# Patient Record
Sex: Female | Born: 1945
Health system: Southern US, Community
[De-identification: ages and names within clinical notes are randomized; demographics above are authoritative.]

## PROBLEM LIST (undated history)

## (undated) DIAGNOSIS — J449 Chronic obstructive pulmonary disease, unspecified: Secondary | ICD-10-CM

## (undated) DIAGNOSIS — I10 Essential (primary) hypertension: Secondary | ICD-10-CM

## (undated) DIAGNOSIS — E785 Hyperlipidemia, unspecified: Secondary | ICD-10-CM

## (undated) DIAGNOSIS — E039 Hypothyroidism, unspecified: Secondary | ICD-10-CM

## (undated) DIAGNOSIS — D126 Benign neoplasm of colon, unspecified: Secondary | ICD-10-CM

## (undated) DIAGNOSIS — E669 Obesity, unspecified: Secondary | ICD-10-CM

## (undated) DIAGNOSIS — Z86718 Personal history of other venous thrombosis and embolism: Secondary | ICD-10-CM

## (undated) DIAGNOSIS — I739 Peripheral vascular disease, unspecified: Secondary | ICD-10-CM

## (undated) DIAGNOSIS — R002 Palpitations: Secondary | ICD-10-CM

## (undated) DIAGNOSIS — R112 Nausea with vomiting, unspecified: Secondary | ICD-10-CM

## (undated) DIAGNOSIS — J45909 Unspecified asthma, uncomplicated: Secondary | ICD-10-CM

## (undated) DIAGNOSIS — Z923 Personal history of irradiation: Secondary | ICD-10-CM

## (undated) DIAGNOSIS — N183 Chronic kidney disease, stage 3 unspecified: Secondary | ICD-10-CM

## (undated) DIAGNOSIS — M5090 Cervical disc disorder, unspecified, unspecified cervical region: Secondary | ICD-10-CM

## (undated) DIAGNOSIS — I2699 Other pulmonary embolism without acute cor pulmonale: Secondary | ICD-10-CM

## (undated) DIAGNOSIS — I2510 Atherosclerotic heart disease of native coronary artery without angina pectoris: Secondary | ICD-10-CM

## (undated) DIAGNOSIS — I5032 Chronic diastolic (congestive) heart failure: Secondary | ICD-10-CM

## (undated) DIAGNOSIS — C3490 Malignant neoplasm of unspecified part of unspecified bronchus or lung: Secondary | ICD-10-CM

## (undated) DIAGNOSIS — Z87891 Personal history of nicotine dependence: Secondary | ICD-10-CM

## (undated) DIAGNOSIS — Z9889 Other specified postprocedural states: Secondary | ICD-10-CM

## (undated) DIAGNOSIS — E119 Type 2 diabetes mellitus without complications: Secondary | ICD-10-CM

## (undated) DIAGNOSIS — K219 Gastro-esophageal reflux disease without esophagitis: Secondary | ICD-10-CM

## (undated) DIAGNOSIS — M4326 Fusion of spine, lumbar region: Secondary | ICD-10-CM

## (undated) DIAGNOSIS — N289 Disorder of kidney and ureter, unspecified: Secondary | ICD-10-CM

## (undated) DIAGNOSIS — I639 Cerebral infarction, unspecified: Secondary | ICD-10-CM

## (undated) HISTORY — DX: Chronic kidney disease, stage 3 unspecified: N18.30

## (undated) HISTORY — DX: Personal history of other venous thrombosis and embolism: Z86.718

## (undated) HISTORY — DX: Obesity, unspecified: E66.9

## (undated) HISTORY — DX: Atherosclerotic heart disease of native coronary artery without angina pectoris: I25.10

## (undated) HISTORY — PX: CHOLECYSTECTOMY: SHX55

## (undated) HISTORY — DX: Peripheral vascular disease, unspecified: I73.9

## (undated) HISTORY — DX: Hyperlipidemia, unspecified: E78.5

## (undated) HISTORY — DX: Chronic diastolic (congestive) heart failure: I50.32

## (undated) HISTORY — DX: Malignant neoplasm of unspecified part of unspecified bronchus or lung: C34.90

## (undated) HISTORY — DX: Personal history of nicotine dependence: Z87.891

## (undated) HISTORY — DX: Gastro-esophageal reflux disease without esophagitis: K21.9

## (undated) HISTORY — DX: Essential (primary) hypertension: I10

## (undated) HISTORY — PX: TRIGGER FINGER RELEASE: SHX641

## (undated) HISTORY — DX: Hypothyroidism, unspecified: E03.9

## (undated) HISTORY — PX: REPLACEMENT TOTAL KNEE: SUR1224

## (undated) HISTORY — DX: Benign neoplasm of colon, unspecified: D12.6

## (undated) HISTORY — PX: BACK SURGERY: SHX140

## (undated) HISTORY — DX: Palpitations: R00.2

## (undated) HISTORY — PX: THYROIDECTOMY: SHX17

## (undated) HISTORY — DX: Cerebral infarction, unspecified: I63.9

## (undated) HISTORY — DX: Chronic obstructive pulmonary disease, unspecified: J44.9

## (undated) HISTORY — PX: BREAST SURGERY: SHX581

## (undated) HISTORY — PX: ANGIOPLASTY / STENTING FEMORAL: SUR30

## (undated) HISTORY — DX: Chronic kidney disease, stage 3 (moderate): N18.3

## (undated) HISTORY — DX: Other pulmonary embolism without acute cor pulmonale: I26.99

## (undated) HISTORY — DX: Fusion of spine, lumbar region: M43.26

## (undated) HISTORY — PX: DIAGNOSTIC LAPAROSCOPY: SUR761

## (undated) HISTORY — PX: MASTECTOMY: SHX3

---
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
## 1999-03-14 DIAGNOSIS — I129 Hypertensive chronic kidney disease with stage 1 through stage 4 chronic kidney disease, or unspecified chronic kidney disease: Secondary | ICD-10-CM | POA: Insufficient documentation

## 1999-03-14 DIAGNOSIS — E669 Obesity, unspecified: Secondary | ICD-10-CM | POA: Insufficient documentation

## 1999-03-14 DIAGNOSIS — Z7901 Long term (current) use of anticoagulants: Secondary | ICD-10-CM | POA: Insufficient documentation

## 1999-03-14 DIAGNOSIS — Z794 Long term (current) use of insulin: Secondary | ICD-10-CM | POA: Insufficient documentation

## 1999-03-14 DIAGNOSIS — I700 Atherosclerosis of aorta: Secondary | ICD-10-CM | POA: Insufficient documentation

## 1999-03-14 DIAGNOSIS — G9341 Metabolic encephalopathy: Secondary | ICD-10-CM | POA: Insufficient documentation

## 1999-03-14 DIAGNOSIS — J439 Emphysema, unspecified: Secondary | ICD-10-CM | POA: Insufficient documentation

## 1999-03-14 DIAGNOSIS — Z7951 Long term (current) use of inhaled steroids: Secondary | ICD-10-CM | POA: Insufficient documentation

## 1999-03-14 DIAGNOSIS — E1122 Type 2 diabetes mellitus with diabetic chronic kidney disease: Secondary | ICD-10-CM | POA: Insufficient documentation

## 1999-03-14 DIAGNOSIS — G8929 Other chronic pain: Secondary | ICD-10-CM | POA: Insufficient documentation

## 1999-03-14 DIAGNOSIS — K760 Fatty (change of) liver, not elsewhere classified: Secondary | ICD-10-CM | POA: Insufficient documentation

## 1999-03-14 DIAGNOSIS — E1151 Type 2 diabetes mellitus with diabetic peripheral angiopathy without gangrene: Secondary | ICD-10-CM | POA: Insufficient documentation

## 1999-03-14 DIAGNOSIS — N1832 Chronic kidney disease, stage 3b: Secondary | ICD-10-CM | POA: Insufficient documentation

## 1999-03-14 DIAGNOSIS — K5790 Diverticulosis of intestine, part unspecified, without perforation or abscess without bleeding: Secondary | ICD-10-CM | POA: Insufficient documentation

## 1999-03-14 DIAGNOSIS — I2510 Atherosclerotic heart disease of native coronary artery without angina pectoris: Secondary | ICD-10-CM | POA: Insufficient documentation

## 1999-03-14 DIAGNOSIS — Z86711 Personal history of pulmonary embolism: Secondary | ICD-10-CM | POA: Insufficient documentation

## 1999-03-14 DIAGNOSIS — E875 Hyperkalemia: Secondary | ICD-10-CM | POA: Insufficient documentation

## 1999-03-14 DIAGNOSIS — E8809 Other disorders of plasma-protein metabolism, not elsewhere classified: Secondary | ICD-10-CM | POA: Insufficient documentation

## 1999-03-14 DIAGNOSIS — G894 Chronic pain syndrome: Secondary | ICD-10-CM | POA: Insufficient documentation

## 1999-03-14 DIAGNOSIS — C50911 Malignant neoplasm of unspecified site of right female breast: Secondary | ICD-10-CM | POA: Insufficient documentation

## 1999-03-14 HISTORY — DX: Malignant neoplasm of unspecified site of right female breast: C50.911

## 2004-11-09 ENCOUNTER — Inpatient Hospital Stay: Payer: Self-pay | Admitting: Otolaryngology

## 2005-03-27 ENCOUNTER — Ambulatory Visit: Payer: Self-pay | Admitting: Orthopedic Surgery

## 2006-06-28 ENCOUNTER — Ambulatory Visit: Payer: Self-pay | Admitting: Internal Medicine

## 2006-08-17 ENCOUNTER — Ambulatory Visit: Payer: Self-pay | Admitting: Orthopedic Surgery

## 2006-08-21 ENCOUNTER — Ambulatory Visit: Payer: Self-pay | Admitting: Orthopedic Surgery

## 2007-07-30 ENCOUNTER — Ambulatory Visit: Payer: Self-pay | Admitting: Internal Medicine

## 2007-10-01 ENCOUNTER — Ambulatory Visit: Payer: Self-pay | Admitting: Urology

## 2007-10-02 ENCOUNTER — Ambulatory Visit: Payer: Self-pay | Admitting: Internal Medicine

## 2007-12-24 ENCOUNTER — Ambulatory Visit: Payer: Self-pay | Admitting: Internal Medicine

## 2007-12-26 ENCOUNTER — Ambulatory Visit: Payer: Self-pay | Admitting: Internal Medicine

## 2008-01-10 ENCOUNTER — Ambulatory Visit: Payer: Self-pay | Admitting: Unknown Physician Specialty

## 2008-01-16 ENCOUNTER — Ambulatory Visit: Payer: Self-pay | Admitting: Unknown Physician Specialty

## 2008-08-24 ENCOUNTER — Ambulatory Visit: Payer: Self-pay | Admitting: Urology

## 2008-09-02 ENCOUNTER — Ambulatory Visit: Payer: Self-pay | Admitting: Urology

## 2009-03-13 LAB — HM COLONOSCOPY

## 2009-06-04 ENCOUNTER — Ambulatory Visit: Payer: Self-pay | Admitting: Internal Medicine

## 2009-08-17 ENCOUNTER — Ambulatory Visit: Payer: Self-pay | Admitting: Urology

## 2010-01-13 ENCOUNTER — Ambulatory Visit: Payer: Self-pay | Admitting: Internal Medicine

## 2010-07-11 ENCOUNTER — Ambulatory Visit: Payer: Self-pay | Admitting: Vascular Surgery

## 2010-08-22 ENCOUNTER — Ambulatory Visit: Payer: Self-pay | Admitting: Urology

## 2010-12-13 ENCOUNTER — Ambulatory Visit: Payer: Self-pay | Admitting: Family

## 2011-01-04 ENCOUNTER — Ambulatory Visit: Payer: Self-pay | Admitting: Cardiovascular Disease

## 2011-01-04 HISTORY — PX: CARDIAC CATHETERIZATION: SHX172

## 2011-03-14 HISTORY — PX: COLONOSCOPY: SHX174

## 2011-03-22 DIAGNOSIS — R1013 Epigastric pain: Secondary | ICD-10-CM | POA: Diagnosis not present

## 2011-03-22 DIAGNOSIS — K219 Gastro-esophageal reflux disease without esophagitis: Secondary | ICD-10-CM | POA: Diagnosis not present

## 2011-03-22 DIAGNOSIS — R109 Unspecified abdominal pain: Secondary | ICD-10-CM | POA: Diagnosis not present

## 2011-03-22 DIAGNOSIS — I10 Essential (primary) hypertension: Secondary | ICD-10-CM | POA: Diagnosis not present

## 2011-03-22 DIAGNOSIS — R1031 Right lower quadrant pain: Secondary | ICD-10-CM | POA: Diagnosis not present

## 2011-03-27 ENCOUNTER — Ambulatory Visit: Payer: Self-pay | Admitting: Emergency Medicine

## 2011-03-27 DIAGNOSIS — K5730 Diverticulosis of large intestine without perforation or abscess without bleeding: Secondary | ICD-10-CM | POA: Diagnosis not present

## 2011-03-27 DIAGNOSIS — J438 Other emphysema: Secondary | ICD-10-CM | POA: Diagnosis not present

## 2011-03-27 DIAGNOSIS — R109 Unspecified abdominal pain: Secondary | ICD-10-CM | POA: Diagnosis not present

## 2011-03-27 DIAGNOSIS — J439 Emphysema, unspecified: Secondary | ICD-10-CM | POA: Diagnosis not present

## 2011-03-28 DIAGNOSIS — K5732 Diverticulitis of large intestine without perforation or abscess without bleeding: Secondary | ICD-10-CM | POA: Diagnosis not present

## 2011-04-03 DIAGNOSIS — R109 Unspecified abdominal pain: Secondary | ICD-10-CM | POA: Diagnosis not present

## 2011-04-03 DIAGNOSIS — R1013 Epigastric pain: Secondary | ICD-10-CM | POA: Diagnosis not present

## 2011-04-03 DIAGNOSIS — Z1211 Encounter for screening for malignant neoplasm of colon: Secondary | ICD-10-CM | POA: Diagnosis not present

## 2011-04-03 DIAGNOSIS — D126 Benign neoplasm of colon, unspecified: Secondary | ICD-10-CM | POA: Diagnosis not present

## 2011-04-19 DIAGNOSIS — D126 Benign neoplasm of colon, unspecified: Secondary | ICD-10-CM | POA: Diagnosis not present

## 2011-04-19 DIAGNOSIS — R1013 Epigastric pain: Secondary | ICD-10-CM | POA: Diagnosis not present

## 2011-04-19 DIAGNOSIS — K319 Disease of stomach and duodenum, unspecified: Secondary | ICD-10-CM | POA: Diagnosis not present

## 2011-04-19 DIAGNOSIS — Z1211 Encounter for screening for malignant neoplasm of colon: Secondary | ICD-10-CM | POA: Diagnosis not present

## 2011-04-19 DIAGNOSIS — R1031 Right lower quadrant pain: Secondary | ICD-10-CM | POA: Diagnosis not present

## 2011-04-19 DIAGNOSIS — K2940 Chronic atrophic gastritis without bleeding: Secondary | ICD-10-CM | POA: Diagnosis not present

## 2011-05-11 DIAGNOSIS — I10 Essential (primary) hypertension: Secondary | ICD-10-CM | POA: Diagnosis not present

## 2011-05-11 DIAGNOSIS — E785 Hyperlipidemia, unspecified: Secondary | ICD-10-CM | POA: Diagnosis not present

## 2011-05-11 DIAGNOSIS — I739 Peripheral vascular disease, unspecified: Secondary | ICD-10-CM | POA: Diagnosis not present

## 2011-05-11 DIAGNOSIS — I2510 Atherosclerotic heart disease of native coronary artery without angina pectoris: Secondary | ICD-10-CM | POA: Diagnosis not present

## 2011-05-16 DIAGNOSIS — I10 Essential (primary) hypertension: Secondary | ICD-10-CM | POA: Diagnosis not present

## 2011-05-16 DIAGNOSIS — I70219 Atherosclerosis of native arteries of extremities with intermittent claudication, unspecified extremity: Secondary | ICD-10-CM | POA: Diagnosis not present

## 2011-05-16 DIAGNOSIS — E785 Hyperlipidemia, unspecified: Secondary | ICD-10-CM | POA: Diagnosis not present

## 2011-06-22 DIAGNOSIS — H2510 Age-related nuclear cataract, unspecified eye: Secondary | ICD-10-CM | POA: Diagnosis not present

## 2011-06-27 DIAGNOSIS — J398 Other specified diseases of upper respiratory tract: Secondary | ICD-10-CM | POA: Diagnosis not present

## 2011-06-27 DIAGNOSIS — J3089 Other allergic rhinitis: Secondary | ICD-10-CM | POA: Diagnosis not present

## 2011-07-12 DIAGNOSIS — M6530 Trigger finger, unspecified finger: Secondary | ICD-10-CM | POA: Diagnosis not present

## 2011-07-17 ENCOUNTER — Ambulatory Visit: Payer: Self-pay | Admitting: Vascular Surgery

## 2011-07-17 DIAGNOSIS — Z9889 Other specified postprocedural states: Secondary | ICD-10-CM | POA: Diagnosis not present

## 2011-07-17 DIAGNOSIS — I70219 Atherosclerosis of native arteries of extremities with intermittent claudication, unspecified extremity: Secondary | ICD-10-CM | POA: Diagnosis not present

## 2011-07-17 DIAGNOSIS — I10 Essential (primary) hypertension: Secondary | ICD-10-CM | POA: Diagnosis not present

## 2011-07-17 DIAGNOSIS — I739 Peripheral vascular disease, unspecified: Secondary | ICD-10-CM | POA: Diagnosis not present

## 2011-08-09 DIAGNOSIS — M6530 Trigger finger, unspecified finger: Secondary | ICD-10-CM | POA: Diagnosis not present

## 2011-08-17 DIAGNOSIS — J398 Other specified diseases of upper respiratory tract: Secondary | ICD-10-CM | POA: Diagnosis not present

## 2011-08-22 DIAGNOSIS — N1330 Unspecified hydronephrosis: Secondary | ICD-10-CM | POA: Diagnosis not present

## 2011-08-25 ENCOUNTER — Ambulatory Visit: Payer: Self-pay | Admitting: Urology

## 2011-08-25 DIAGNOSIS — N1330 Unspecified hydronephrosis: Secondary | ICD-10-CM | POA: Diagnosis not present

## 2011-09-05 DIAGNOSIS — N1330 Unspecified hydronephrosis: Secondary | ICD-10-CM | POA: Diagnosis not present

## 2011-09-05 DIAGNOSIS — I10 Essential (primary) hypertension: Secondary | ICD-10-CM | POA: Diagnosis not present

## 2011-09-05 DIAGNOSIS — I70219 Atherosclerosis of native arteries of extremities with intermittent claudication, unspecified extremity: Secondary | ICD-10-CM | POA: Diagnosis not present

## 2011-09-05 DIAGNOSIS — E785 Hyperlipidemia, unspecified: Secondary | ICD-10-CM | POA: Diagnosis not present

## 2011-09-20 ENCOUNTER — Ambulatory Visit: Payer: Self-pay | Admitting: Urology

## 2011-09-20 DIAGNOSIS — N1330 Unspecified hydronephrosis: Secondary | ICD-10-CM | POA: Diagnosis not present

## 2011-09-20 DIAGNOSIS — I10 Essential (primary) hypertension: Secondary | ICD-10-CM | POA: Diagnosis not present

## 2011-09-20 DIAGNOSIS — Z01812 Encounter for preprocedural laboratory examination: Secondary | ICD-10-CM | POA: Diagnosis not present

## 2011-09-20 LAB — BASIC METABOLIC PANEL
Anion Gap: 9 (ref 7–16)
BUN: 17 mg/dL (ref 7–18)
Calcium, Total: 8.4 mg/dL — ABNORMAL LOW (ref 8.5–10.1)
Chloride: 103 mmol/L (ref 98–107)
Co2: 28 mmol/L (ref 21–32)
Creatinine: 1.31 mg/dL — ABNORMAL HIGH (ref 0.60–1.30)
EGFR (African American): 48 — ABNORMAL LOW
EGFR (Non-African Amer.): 42 — ABNORMAL LOW
Glucose: 139 mg/dL — ABNORMAL HIGH (ref 65–99)
Osmolality: 283 (ref 275–301)
Potassium: 4.4 mmol/L (ref 3.5–5.1)
Sodium: 140 mmol/L (ref 136–145)

## 2011-09-20 LAB — HEMOGLOBIN: HGB: 12.4 g/dL (ref 12.0–16.0)

## 2011-09-22 LAB — URINE CULTURE

## 2011-09-25 DIAGNOSIS — M1990 Unspecified osteoarthritis, unspecified site: Secondary | ICD-10-CM | POA: Diagnosis not present

## 2011-09-25 DIAGNOSIS — M545 Low back pain, unspecified: Secondary | ICD-10-CM | POA: Diagnosis not present

## 2011-09-26 ENCOUNTER — Ambulatory Visit: Payer: Self-pay | Admitting: Internal Medicine

## 2011-09-26 DIAGNOSIS — D649 Anemia, unspecified: Secondary | ICD-10-CM | POA: Diagnosis not present

## 2011-09-26 DIAGNOSIS — M25559 Pain in unspecified hip: Secondary | ICD-10-CM | POA: Diagnosis not present

## 2011-09-26 DIAGNOSIS — I70219 Atherosclerosis of native arteries of extremities with intermittent claudication, unspecified extremity: Secondary | ICD-10-CM | POA: Diagnosis not present

## 2011-09-26 DIAGNOSIS — M79609 Pain in unspecified limb: Secondary | ICD-10-CM | POA: Diagnosis not present

## 2011-09-26 DIAGNOSIS — E7800 Pure hypercholesterolemia, unspecified: Secondary | ICD-10-CM | POA: Diagnosis not present

## 2011-09-26 DIAGNOSIS — M5137 Other intervertebral disc degeneration, lumbosacral region: Secondary | ICD-10-CM | POA: Diagnosis not present

## 2011-09-26 DIAGNOSIS — M51379 Other intervertebral disc degeneration, lumbosacral region without mention of lumbar back pain or lower extremity pain: Secondary | ICD-10-CM | POA: Diagnosis not present

## 2011-09-28 ENCOUNTER — Emergency Department: Payer: Self-pay | Admitting: Emergency Medicine

## 2011-09-28 DIAGNOSIS — M1990 Unspecified osteoarthritis, unspecified site: Secondary | ICD-10-CM | POA: Diagnosis not present

## 2011-09-28 DIAGNOSIS — M545 Low back pain, unspecified: Secondary | ICD-10-CM | POA: Diagnosis not present

## 2011-09-28 DIAGNOSIS — Z79899 Other long term (current) drug therapy: Secondary | ICD-10-CM | POA: Diagnosis not present

## 2011-09-28 DIAGNOSIS — R52 Pain, unspecified: Secondary | ICD-10-CM | POA: Diagnosis not present

## 2011-10-04 ENCOUNTER — Ambulatory Visit: Payer: Self-pay | Admitting: Internal Medicine

## 2011-10-04 DIAGNOSIS — M5137 Other intervertebral disc degeneration, lumbosacral region: Secondary | ICD-10-CM | POA: Diagnosis not present

## 2011-10-04 DIAGNOSIS — M5126 Other intervertebral disc displacement, lumbar region: Secondary | ICD-10-CM | POA: Diagnosis not present

## 2011-10-04 DIAGNOSIS — M48061 Spinal stenosis, lumbar region without neurogenic claudication: Secondary | ICD-10-CM | POA: Diagnosis not present

## 2011-10-13 DIAGNOSIS — IMO0002 Reserved for concepts with insufficient information to code with codable children: Secondary | ICD-10-CM | POA: Diagnosis not present

## 2011-11-01 ENCOUNTER — Ambulatory Visit: Payer: Self-pay | Admitting: Urology

## 2011-11-01 DIAGNOSIS — N135 Crossing vessel and stricture of ureter without hydronephrosis: Secondary | ICD-10-CM | POA: Diagnosis not present

## 2011-11-01 DIAGNOSIS — Z01812 Encounter for preprocedural laboratory examination: Secondary | ICD-10-CM | POA: Diagnosis not present

## 2011-11-01 DIAGNOSIS — N1330 Unspecified hydronephrosis: Secondary | ICD-10-CM | POA: Diagnosis not present

## 2011-11-03 LAB — URINE CULTURE

## 2011-11-08 ENCOUNTER — Ambulatory Visit: Payer: Self-pay | Admitting: Urology

## 2011-11-08 DIAGNOSIS — J309 Allergic rhinitis, unspecified: Secondary | ICD-10-CM | POA: Diagnosis not present

## 2011-11-08 DIAGNOSIS — Z808 Family history of malignant neoplasm of other organs or systems: Secondary | ICD-10-CM | POA: Diagnosis not present

## 2011-11-08 DIAGNOSIS — F17200 Nicotine dependence, unspecified, uncomplicated: Secondary | ICD-10-CM | POA: Diagnosis not present

## 2011-11-08 DIAGNOSIS — K3189 Other diseases of stomach and duodenum: Secondary | ICD-10-CM | POA: Diagnosis not present

## 2011-11-08 DIAGNOSIS — Z800 Family history of malignant neoplasm of digestive organs: Secondary | ICD-10-CM | POA: Diagnosis not present

## 2011-11-08 DIAGNOSIS — I739 Peripheral vascular disease, unspecified: Secondary | ICD-10-CM | POA: Diagnosis not present

## 2011-11-08 DIAGNOSIS — Z8489 Family history of other specified conditions: Secondary | ICD-10-CM | POA: Diagnosis not present

## 2011-11-08 DIAGNOSIS — I2510 Atherosclerotic heart disease of native coronary artery without angina pectoris: Secondary | ICD-10-CM | POA: Diagnosis not present

## 2011-11-08 DIAGNOSIS — Z8249 Family history of ischemic heart disease and other diseases of the circulatory system: Secondary | ICD-10-CM | POA: Diagnosis not present

## 2011-11-08 DIAGNOSIS — R42 Dizziness and giddiness: Secondary | ICD-10-CM | POA: Diagnosis not present

## 2011-11-08 DIAGNOSIS — Q6239 Other obstructive defects of renal pelvis and ureter: Secondary | ICD-10-CM | POA: Diagnosis not present

## 2011-11-08 DIAGNOSIS — Z833 Family history of diabetes mellitus: Secondary | ICD-10-CM | POA: Diagnosis not present

## 2011-11-08 DIAGNOSIS — N135 Crossing vessel and stricture of ureter without hydronephrosis: Secondary | ICD-10-CM | POA: Diagnosis not present

## 2011-11-08 DIAGNOSIS — Z7982 Long term (current) use of aspirin: Secondary | ICD-10-CM | POA: Diagnosis not present

## 2011-11-08 DIAGNOSIS — M4800 Spinal stenosis, site unspecified: Secondary | ICD-10-CM | POA: Diagnosis not present

## 2011-11-08 DIAGNOSIS — N1330 Unspecified hydronephrosis: Secondary | ICD-10-CM | POA: Diagnosis not present

## 2011-11-08 DIAGNOSIS — I10 Essential (primary) hypertension: Secondary | ICD-10-CM | POA: Diagnosis not present

## 2011-11-08 DIAGNOSIS — E079 Disorder of thyroid, unspecified: Secondary | ICD-10-CM | POA: Diagnosis not present

## 2011-11-08 DIAGNOSIS — I209 Angina pectoris, unspecified: Secondary | ICD-10-CM | POA: Diagnosis not present

## 2011-11-08 DIAGNOSIS — Z79899 Other long term (current) drug therapy: Secondary | ICD-10-CM | POA: Diagnosis not present

## 2011-11-16 DIAGNOSIS — I2510 Atherosclerotic heart disease of native coronary artery without angina pectoris: Secondary | ICD-10-CM | POA: Diagnosis not present

## 2011-11-16 DIAGNOSIS — I739 Peripheral vascular disease, unspecified: Secondary | ICD-10-CM | POA: Diagnosis not present

## 2011-11-16 DIAGNOSIS — E785 Hyperlipidemia, unspecified: Secondary | ICD-10-CM | POA: Diagnosis not present

## 2011-11-16 DIAGNOSIS — R079 Chest pain, unspecified: Secondary | ICD-10-CM | POA: Diagnosis not present

## 2011-11-17 DIAGNOSIS — N1330 Unspecified hydronephrosis: Secondary | ICD-10-CM | POA: Insufficient documentation

## 2011-11-17 DIAGNOSIS — R011 Cardiac murmur, unspecified: Secondary | ICD-10-CM | POA: Diagnosis not present

## 2011-11-21 DIAGNOSIS — Q6239 Other obstructive defects of renal pelvis and ureter: Secondary | ICD-10-CM | POA: Insufficient documentation

## 2011-11-21 DIAGNOSIS — N1330 Unspecified hydronephrosis: Secondary | ICD-10-CM | POA: Diagnosis not present

## 2011-11-22 DIAGNOSIS — R072 Precordial pain: Secondary | ICD-10-CM | POA: Diagnosis not present

## 2011-11-24 DIAGNOSIS — I10 Essential (primary) hypertension: Secondary | ICD-10-CM | POA: Diagnosis not present

## 2011-11-24 DIAGNOSIS — E785 Hyperlipidemia, unspecified: Secondary | ICD-10-CM | POA: Diagnosis not present

## 2011-11-24 DIAGNOSIS — I2510 Atherosclerotic heart disease of native coronary artery without angina pectoris: Secondary | ICD-10-CM | POA: Diagnosis not present

## 2011-11-24 DIAGNOSIS — I739 Peripheral vascular disease, unspecified: Secondary | ICD-10-CM | POA: Diagnosis not present

## 2011-11-28 DIAGNOSIS — I10 Essential (primary) hypertension: Secondary | ICD-10-CM | POA: Diagnosis not present

## 2011-11-28 DIAGNOSIS — Z79899 Other long term (current) drug therapy: Secondary | ICD-10-CM | POA: Diagnosis not present

## 2011-11-28 DIAGNOSIS — R079 Chest pain, unspecified: Secondary | ICD-10-CM | POA: Diagnosis not present

## 2011-11-28 DIAGNOSIS — R0602 Shortness of breath: Secondary | ICD-10-CM | POA: Diagnosis not present

## 2011-12-07 ENCOUNTER — Ambulatory Visit: Payer: Self-pay | Admitting: Cardiovascular Disease

## 2011-12-07 DIAGNOSIS — Z8249 Family history of ischemic heart disease and other diseases of the circulatory system: Secondary | ICD-10-CM | POA: Diagnosis not present

## 2011-12-07 DIAGNOSIS — I2510 Atherosclerotic heart disease of native coronary artery without angina pectoris: Secondary | ICD-10-CM | POA: Diagnosis not present

## 2011-12-07 DIAGNOSIS — I739 Peripheral vascular disease, unspecified: Secondary | ICD-10-CM | POA: Diagnosis not present

## 2011-12-07 DIAGNOSIS — I200 Unstable angina: Secondary | ICD-10-CM | POA: Diagnosis not present

## 2011-12-07 DIAGNOSIS — E785 Hyperlipidemia, unspecified: Secondary | ICD-10-CM | POA: Diagnosis not present

## 2011-12-07 DIAGNOSIS — I10 Essential (primary) hypertension: Secondary | ICD-10-CM | POA: Diagnosis not present

## 2011-12-07 DIAGNOSIS — R5381 Other malaise: Secondary | ICD-10-CM | POA: Diagnosis not present

## 2011-12-07 HISTORY — PX: CARDIAC CATHETERIZATION: SHX172

## 2011-12-14 DIAGNOSIS — R0602 Shortness of breath: Secondary | ICD-10-CM | POA: Diagnosis not present

## 2011-12-14 DIAGNOSIS — I739 Peripheral vascular disease, unspecified: Secondary | ICD-10-CM | POA: Diagnosis not present

## 2011-12-14 DIAGNOSIS — E785 Hyperlipidemia, unspecified: Secondary | ICD-10-CM | POA: Diagnosis not present

## 2011-12-14 DIAGNOSIS — I2510 Atherosclerotic heart disease of native coronary artery without angina pectoris: Secondary | ICD-10-CM | POA: Diagnosis not present

## 2011-12-26 DIAGNOSIS — J45901 Unspecified asthma with (acute) exacerbation: Secondary | ICD-10-CM | POA: Diagnosis not present

## 2011-12-26 DIAGNOSIS — J45909 Unspecified asthma, uncomplicated: Secondary | ICD-10-CM | POA: Diagnosis not present

## 2012-01-02 DIAGNOSIS — I10 Essential (primary) hypertension: Secondary | ICD-10-CM | POA: Diagnosis not present

## 2012-01-02 DIAGNOSIS — I739 Peripheral vascular disease, unspecified: Secondary | ICD-10-CM | POA: Diagnosis not present

## 2012-01-02 DIAGNOSIS — I70219 Atherosclerosis of native arteries of extremities with intermittent claudication, unspecified extremity: Secondary | ICD-10-CM | POA: Diagnosis not present

## 2012-01-05 DIAGNOSIS — Q6239 Other obstructive defects of renal pelvis and ureter: Secondary | ICD-10-CM | POA: Diagnosis not present

## 2012-01-05 DIAGNOSIS — R82998 Other abnormal findings in urine: Secondary | ICD-10-CM | POA: Diagnosis not present

## 2012-01-15 DIAGNOSIS — J449 Chronic obstructive pulmonary disease, unspecified: Secondary | ICD-10-CM | POA: Diagnosis not present

## 2012-01-15 DIAGNOSIS — I739 Peripheral vascular disease, unspecified: Secondary | ICD-10-CM | POA: Diagnosis not present

## 2012-01-15 DIAGNOSIS — I10 Essential (primary) hypertension: Secondary | ICD-10-CM | POA: Diagnosis not present

## 2012-01-15 DIAGNOSIS — I2510 Atherosclerotic heart disease of native coronary artery without angina pectoris: Secondary | ICD-10-CM | POA: Diagnosis not present

## 2012-01-18 DIAGNOSIS — Z23 Encounter for immunization: Secondary | ICD-10-CM | POA: Diagnosis not present

## 2012-02-12 DIAGNOSIS — N39498 Other specified urinary incontinence: Secondary | ICD-10-CM | POA: Diagnosis not present

## 2012-02-12 DIAGNOSIS — E039 Hypothyroidism, unspecified: Secondary | ICD-10-CM | POA: Diagnosis not present

## 2012-02-12 DIAGNOSIS — E063 Autoimmune thyroiditis: Secondary | ICD-10-CM | POA: Diagnosis not present

## 2012-02-19 DIAGNOSIS — I10 Essential (primary) hypertension: Secondary | ICD-10-CM | POA: Diagnosis not present

## 2012-02-19 DIAGNOSIS — E785 Hyperlipidemia, unspecified: Secondary | ICD-10-CM | POA: Diagnosis not present

## 2012-02-19 DIAGNOSIS — I70219 Atherosclerosis of native arteries of extremities with intermittent claudication, unspecified extremity: Secondary | ICD-10-CM | POA: Diagnosis not present

## 2012-02-20 DIAGNOSIS — M1990 Unspecified osteoarthritis, unspecified site: Secondary | ICD-10-CM | POA: Diagnosis not present

## 2012-02-20 DIAGNOSIS — E785 Hyperlipidemia, unspecified: Secondary | ICD-10-CM | POA: Diagnosis not present

## 2012-02-20 DIAGNOSIS — E8881 Metabolic syndrome: Secondary | ICD-10-CM | POA: Diagnosis not present

## 2012-03-13 HISTORY — PX: BACK SURGERY: SHX140

## 2012-03-19 DIAGNOSIS — L738 Other specified follicular disorders: Secondary | ICD-10-CM | POA: Diagnosis not present

## 2012-03-19 DIAGNOSIS — L219 Seborrheic dermatitis, unspecified: Secondary | ICD-10-CM | POA: Diagnosis not present

## 2012-03-19 DIAGNOSIS — L989 Disorder of the skin and subcutaneous tissue, unspecified: Secondary | ICD-10-CM | POA: Diagnosis not present

## 2012-03-19 DIAGNOSIS — M5030 Other cervical disc degeneration, unspecified cervical region: Secondary | ICD-10-CM | POA: Diagnosis not present

## 2012-03-22 DIAGNOSIS — I10 Essential (primary) hypertension: Secondary | ICD-10-CM | POA: Diagnosis not present

## 2012-03-22 DIAGNOSIS — N289 Disorder of kidney and ureter, unspecified: Secondary | ICD-10-CM | POA: Diagnosis not present

## 2012-03-22 DIAGNOSIS — Z5181 Encounter for therapeutic drug level monitoring: Secondary | ICD-10-CM | POA: Diagnosis not present

## 2012-03-22 DIAGNOSIS — R9431 Abnormal electrocardiogram [ECG] [EKG]: Secondary | ICD-10-CM | POA: Diagnosis not present

## 2012-03-22 DIAGNOSIS — Z01818 Encounter for other preprocedural examination: Secondary | ICD-10-CM | POA: Diagnosis not present

## 2012-03-22 DIAGNOSIS — M47812 Spondylosis without myelopathy or radiculopathy, cervical region: Secondary | ICD-10-CM | POA: Diagnosis not present

## 2012-03-22 DIAGNOSIS — Z79899 Other long term (current) drug therapy: Secondary | ICD-10-CM | POA: Diagnosis not present

## 2012-03-25 DIAGNOSIS — I2510 Atherosclerotic heart disease of native coronary artery without angina pectoris: Secondary | ICD-10-CM | POA: Diagnosis not present

## 2012-03-25 DIAGNOSIS — I739 Peripheral vascular disease, unspecified: Secondary | ICD-10-CM | POA: Diagnosis not present

## 2012-03-25 DIAGNOSIS — E785 Hyperlipidemia, unspecified: Secondary | ICD-10-CM | POA: Diagnosis not present

## 2012-03-25 DIAGNOSIS — I10 Essential (primary) hypertension: Secondary | ICD-10-CM | POA: Diagnosis not present

## 2012-03-26 DIAGNOSIS — Z23 Encounter for immunization: Secondary | ICD-10-CM | POA: Diagnosis not present

## 2012-03-27 DIAGNOSIS — M549 Dorsalgia, unspecified: Secondary | ICD-10-CM | POA: Diagnosis not present

## 2012-03-27 DIAGNOSIS — I2510 Atherosclerotic heart disease of native coronary artery without angina pectoris: Secondary | ICD-10-CM | POA: Diagnosis present

## 2012-03-27 DIAGNOSIS — I10 Essential (primary) hypertension: Secondary | ICD-10-CM | POA: Diagnosis not present

## 2012-03-27 DIAGNOSIS — E119 Type 2 diabetes mellitus without complications: Secondary | ICD-10-CM | POA: Diagnosis present

## 2012-03-27 DIAGNOSIS — M5137 Other intervertebral disc degeneration, lumbosacral region: Secondary | ICD-10-CM | POA: Diagnosis not present

## 2012-03-27 DIAGNOSIS — R5381 Other malaise: Secondary | ICD-10-CM | POA: Diagnosis not present

## 2012-03-27 DIAGNOSIS — M48061 Spinal stenosis, lumbar region without neurogenic claudication: Secondary | ICD-10-CM | POA: Diagnosis not present

## 2012-03-27 DIAGNOSIS — N289 Disorder of kidney and ureter, unspecified: Secondary | ICD-10-CM | POA: Diagnosis not present

## 2012-03-27 DIAGNOSIS — E039 Hypothyroidism, unspecified: Secondary | ICD-10-CM | POA: Diagnosis present

## 2012-03-27 DIAGNOSIS — E785 Hyperlipidemia, unspecified: Secondary | ICD-10-CM | POA: Diagnosis present

## 2012-03-27 DIAGNOSIS — G250 Essential tremor: Secondary | ICD-10-CM | POA: Diagnosis not present

## 2012-03-27 DIAGNOSIS — IMO0002 Reserved for concepts with insufficient information to code with codable children: Secondary | ICD-10-CM | POA: Diagnosis not present

## 2012-03-27 DIAGNOSIS — Z981 Arthrodesis status: Secondary | ICD-10-CM | POA: Diagnosis not present

## 2012-03-27 DIAGNOSIS — R7989 Other specified abnormal findings of blood chemistry: Secondary | ICD-10-CM | POA: Diagnosis not present

## 2012-03-27 DIAGNOSIS — IMO0001 Reserved for inherently not codable concepts without codable children: Secondary | ICD-10-CM | POA: Diagnosis present

## 2012-03-27 DIAGNOSIS — M48062 Spinal stenosis, lumbar region with neurogenic claudication: Secondary | ICD-10-CM | POA: Diagnosis not present

## 2012-03-27 DIAGNOSIS — K219 Gastro-esophageal reflux disease without esophagitis: Secondary | ICD-10-CM | POA: Diagnosis present

## 2012-03-27 DIAGNOSIS — M51379 Other intervertebral disc degeneration, lumbosacral region without mention of lumbar back pain or lower extremity pain: Secondary | ICD-10-CM | POA: Diagnosis present

## 2012-03-27 DIAGNOSIS — R279 Unspecified lack of coordination: Secondary | ICD-10-CM | POA: Diagnosis not present

## 2012-03-27 DIAGNOSIS — Z5189 Encounter for other specified aftercare: Secondary | ICD-10-CM | POA: Diagnosis not present

## 2012-03-27 DIAGNOSIS — N189 Chronic kidney disease, unspecified: Secondary | ICD-10-CM | POA: Diagnosis not present

## 2012-03-30 DIAGNOSIS — I10 Essential (primary) hypertension: Secondary | ICD-10-CM | POA: Diagnosis not present

## 2012-03-30 DIAGNOSIS — Z5189 Encounter for other specified aftercare: Secondary | ICD-10-CM | POA: Diagnosis not present

## 2012-03-30 DIAGNOSIS — R7989 Other specified abnormal findings of blood chemistry: Secondary | ICD-10-CM | POA: Diagnosis not present

## 2012-03-30 DIAGNOSIS — N189 Chronic kidney disease, unspecified: Secondary | ICD-10-CM | POA: Diagnosis not present

## 2012-03-30 DIAGNOSIS — R5381 Other malaise: Secondary | ICD-10-CM | POA: Diagnosis not present

## 2012-03-30 DIAGNOSIS — E119 Type 2 diabetes mellitus without complications: Secondary | ICD-10-CM | POA: Diagnosis not present

## 2012-04-09 DIAGNOSIS — I259 Chronic ischemic heart disease, unspecified: Secondary | ICD-10-CM | POA: Diagnosis not present

## 2012-04-09 DIAGNOSIS — M48061 Spinal stenosis, lumbar region without neurogenic claudication: Secondary | ICD-10-CM | POA: Diagnosis not present

## 2012-04-09 DIAGNOSIS — M549 Dorsalgia, unspecified: Secondary | ICD-10-CM | POA: Diagnosis not present

## 2012-04-09 DIAGNOSIS — Z4789 Encounter for other orthopedic aftercare: Secondary | ICD-10-CM | POA: Diagnosis not present

## 2012-04-09 DIAGNOSIS — N189 Chronic kidney disease, unspecified: Secondary | ICD-10-CM | POA: Diagnosis not present

## 2012-04-09 DIAGNOSIS — R269 Unspecified abnormalities of gait and mobility: Secondary | ICD-10-CM | POA: Diagnosis not present

## 2012-04-09 DIAGNOSIS — Z9181 History of falling: Secondary | ICD-10-CM | POA: Diagnosis not present

## 2012-04-09 DIAGNOSIS — I129 Hypertensive chronic kidney disease with stage 1 through stage 4 chronic kidney disease, or unspecified chronic kidney disease: Secondary | ICD-10-CM | POA: Diagnosis not present

## 2012-04-11 DIAGNOSIS — M48061 Spinal stenosis, lumbar region without neurogenic claudication: Secondary | ICD-10-CM | POA: Diagnosis not present

## 2012-04-11 DIAGNOSIS — N189 Chronic kidney disease, unspecified: Secondary | ICD-10-CM | POA: Diagnosis not present

## 2012-04-11 DIAGNOSIS — M5030 Other cervical disc degeneration, unspecified cervical region: Secondary | ICD-10-CM | POA: Diagnosis not present

## 2012-04-11 DIAGNOSIS — I129 Hypertensive chronic kidney disease with stage 1 through stage 4 chronic kidney disease, or unspecified chronic kidney disease: Secondary | ICD-10-CM | POA: Diagnosis not present

## 2012-04-11 DIAGNOSIS — R269 Unspecified abnormalities of gait and mobility: Secondary | ICD-10-CM | POA: Diagnosis not present

## 2012-04-11 DIAGNOSIS — Z4789 Encounter for other orthopedic aftercare: Secondary | ICD-10-CM | POA: Diagnosis not present

## 2012-04-11 DIAGNOSIS — M549 Dorsalgia, unspecified: Secondary | ICD-10-CM | POA: Diagnosis not present

## 2012-04-16 DIAGNOSIS — M48061 Spinal stenosis, lumbar region without neurogenic claudication: Secondary | ICD-10-CM | POA: Diagnosis not present

## 2012-04-16 DIAGNOSIS — Z4789 Encounter for other orthopedic aftercare: Secondary | ICD-10-CM | POA: Diagnosis not present

## 2012-04-16 DIAGNOSIS — N189 Chronic kidney disease, unspecified: Secondary | ICD-10-CM | POA: Diagnosis not present

## 2012-04-16 DIAGNOSIS — R269 Unspecified abnormalities of gait and mobility: Secondary | ICD-10-CM | POA: Diagnosis not present

## 2012-04-16 DIAGNOSIS — I129 Hypertensive chronic kidney disease with stage 1 through stage 4 chronic kidney disease, or unspecified chronic kidney disease: Secondary | ICD-10-CM | POA: Diagnosis not present

## 2012-04-16 DIAGNOSIS — M549 Dorsalgia, unspecified: Secondary | ICD-10-CM | POA: Diagnosis not present

## 2012-04-19 DIAGNOSIS — M48061 Spinal stenosis, lumbar region without neurogenic claudication: Secondary | ICD-10-CM | POA: Diagnosis not present

## 2012-04-19 DIAGNOSIS — Z4789 Encounter for other orthopedic aftercare: Secondary | ICD-10-CM | POA: Diagnosis not present

## 2012-04-19 DIAGNOSIS — I129 Hypertensive chronic kidney disease with stage 1 through stage 4 chronic kidney disease, or unspecified chronic kidney disease: Secondary | ICD-10-CM | POA: Diagnosis not present

## 2012-04-19 DIAGNOSIS — R269 Unspecified abnormalities of gait and mobility: Secondary | ICD-10-CM | POA: Diagnosis not present

## 2012-04-19 DIAGNOSIS — N189 Chronic kidney disease, unspecified: Secondary | ICD-10-CM | POA: Diagnosis not present

## 2012-04-19 DIAGNOSIS — M549 Dorsalgia, unspecified: Secondary | ICD-10-CM | POA: Diagnosis not present

## 2012-05-01 DIAGNOSIS — N3020 Other chronic cystitis without hematuria: Secondary | ICD-10-CM | POA: Diagnosis not present

## 2012-05-01 DIAGNOSIS — R82998 Other abnormal findings in urine: Secondary | ICD-10-CM | POA: Diagnosis not present

## 2012-05-01 DIAGNOSIS — N3946 Mixed incontinence: Secondary | ICD-10-CM | POA: Diagnosis not present

## 2012-05-01 DIAGNOSIS — Q6239 Other obstructive defects of renal pelvis and ureter: Secondary | ICD-10-CM | POA: Diagnosis not present

## 2012-05-02 DIAGNOSIS — N189 Chronic kidney disease, unspecified: Secondary | ICD-10-CM | POA: Diagnosis not present

## 2012-05-02 DIAGNOSIS — M549 Dorsalgia, unspecified: Secondary | ICD-10-CM | POA: Diagnosis not present

## 2012-05-02 DIAGNOSIS — Z4789 Encounter for other orthopedic aftercare: Secondary | ICD-10-CM | POA: Diagnosis not present

## 2012-05-02 DIAGNOSIS — R82998 Other abnormal findings in urine: Secondary | ICD-10-CM | POA: Diagnosis not present

## 2012-05-02 DIAGNOSIS — M48061 Spinal stenosis, lumbar region without neurogenic claudication: Secondary | ICD-10-CM | POA: Diagnosis not present

## 2012-05-02 DIAGNOSIS — R269 Unspecified abnormalities of gait and mobility: Secondary | ICD-10-CM | POA: Diagnosis not present

## 2012-05-02 DIAGNOSIS — I129 Hypertensive chronic kidney disease with stage 1 through stage 4 chronic kidney disease, or unspecified chronic kidney disease: Secondary | ICD-10-CM | POA: Diagnosis not present

## 2012-05-09 DIAGNOSIS — M48062 Spinal stenosis, lumbar region with neurogenic claudication: Secondary | ICD-10-CM | POA: Diagnosis not present

## 2012-05-14 DIAGNOSIS — I739 Peripheral vascular disease, unspecified: Secondary | ICD-10-CM | POA: Diagnosis not present

## 2012-05-14 DIAGNOSIS — I10 Essential (primary) hypertension: Secondary | ICD-10-CM | POA: Diagnosis not present

## 2012-05-14 DIAGNOSIS — I2510 Atherosclerotic heart disease of native coronary artery without angina pectoris: Secondary | ICD-10-CM | POA: Diagnosis not present

## 2012-05-14 DIAGNOSIS — E785 Hyperlipidemia, unspecified: Secondary | ICD-10-CM | POA: Diagnosis not present

## 2012-05-21 DIAGNOSIS — I739 Peripheral vascular disease, unspecified: Secondary | ICD-10-CM | POA: Diagnosis not present

## 2012-05-21 DIAGNOSIS — N1330 Unspecified hydronephrosis: Secondary | ICD-10-CM | POA: Diagnosis not present

## 2012-05-21 DIAGNOSIS — E8881 Metabolic syndrome: Secondary | ICD-10-CM | POA: Diagnosis not present

## 2012-05-21 DIAGNOSIS — I10 Essential (primary) hypertension: Secondary | ICD-10-CM | POA: Diagnosis not present

## 2012-05-22 DIAGNOSIS — I10 Essential (primary) hypertension: Secondary | ICD-10-CM | POA: Diagnosis not present

## 2012-05-22 DIAGNOSIS — I70219 Atherosclerosis of native arteries of extremities with intermittent claudication, unspecified extremity: Secondary | ICD-10-CM | POA: Diagnosis not present

## 2012-05-22 DIAGNOSIS — E669 Obesity, unspecified: Secondary | ICD-10-CM | POA: Diagnosis not present

## 2012-05-22 DIAGNOSIS — E785 Hyperlipidemia, unspecified: Secondary | ICD-10-CM | POA: Diagnosis not present

## 2012-06-20 ENCOUNTER — Ambulatory Visit: Payer: Self-pay | Admitting: Internal Medicine

## 2012-06-20 DIAGNOSIS — Z1231 Encounter for screening mammogram for malignant neoplasm of breast: Secondary | ICD-10-CM | POA: Diagnosis not present

## 2012-07-09 DIAGNOSIS — M5430 Sciatica, unspecified side: Secondary | ICD-10-CM | POA: Diagnosis not present

## 2012-07-09 DIAGNOSIS — M545 Low back pain, unspecified: Secondary | ICD-10-CM | POA: Diagnosis not present

## 2012-07-10 DIAGNOSIS — H43319 Vitreous membranes and strands, unspecified eye: Secondary | ICD-10-CM | POA: Diagnosis not present

## 2012-07-15 DIAGNOSIS — I739 Peripheral vascular disease, unspecified: Secondary | ICD-10-CM | POA: Diagnosis not present

## 2012-07-15 DIAGNOSIS — J449 Chronic obstructive pulmonary disease, unspecified: Secondary | ICD-10-CM | POA: Diagnosis not present

## 2012-07-15 DIAGNOSIS — R0602 Shortness of breath: Secondary | ICD-10-CM | POA: Diagnosis not present

## 2012-07-15 DIAGNOSIS — I2510 Atherosclerotic heart disease of native coronary artery without angina pectoris: Secondary | ICD-10-CM | POA: Diagnosis not present

## 2012-07-23 DIAGNOSIS — M47817 Spondylosis without myelopathy or radiculopathy, lumbosacral region: Secondary | ICD-10-CM | POA: Diagnosis not present

## 2012-07-29 DIAGNOSIS — R609 Edema, unspecified: Secondary | ICD-10-CM | POA: Diagnosis not present

## 2012-07-29 DIAGNOSIS — I2510 Atherosclerotic heart disease of native coronary artery without angina pectoris: Secondary | ICD-10-CM | POA: Diagnosis not present

## 2012-07-29 DIAGNOSIS — I739 Peripheral vascular disease, unspecified: Secondary | ICD-10-CM | POA: Diagnosis not present

## 2012-07-29 DIAGNOSIS — E785 Hyperlipidemia, unspecified: Secondary | ICD-10-CM | POA: Diagnosis not present

## 2012-07-29 DIAGNOSIS — I509 Heart failure, unspecified: Secondary | ICD-10-CM | POA: Diagnosis not present

## 2012-08-08 DIAGNOSIS — M5430 Sciatica, unspecified side: Secondary | ICD-10-CM | POA: Diagnosis not present

## 2012-08-08 DIAGNOSIS — M48062 Spinal stenosis, lumbar region with neurogenic claudication: Secondary | ICD-10-CM | POA: Diagnosis not present

## 2012-08-12 DIAGNOSIS — I10 Essential (primary) hypertension: Secondary | ICD-10-CM | POA: Diagnosis not present

## 2012-08-12 DIAGNOSIS — I2510 Atherosclerotic heart disease of native coronary artery without angina pectoris: Secondary | ICD-10-CM | POA: Diagnosis not present

## 2012-08-12 DIAGNOSIS — E785 Hyperlipidemia, unspecified: Secondary | ICD-10-CM | POA: Diagnosis not present

## 2012-08-12 DIAGNOSIS — I509 Heart failure, unspecified: Secondary | ICD-10-CM | POA: Diagnosis not present

## 2012-08-29 DIAGNOSIS — I739 Peripheral vascular disease, unspecified: Secondary | ICD-10-CM | POA: Diagnosis not present

## 2012-08-29 DIAGNOSIS — I10 Essential (primary) hypertension: Secondary | ICD-10-CM | POA: Diagnosis not present

## 2012-08-29 DIAGNOSIS — E8881 Metabolic syndrome: Secondary | ICD-10-CM | POA: Diagnosis not present

## 2012-08-29 DIAGNOSIS — K5730 Diverticulosis of large intestine without perforation or abscess without bleeding: Secondary | ICD-10-CM | POA: Diagnosis not present

## 2012-09-17 DIAGNOSIS — M48062 Spinal stenosis, lumbar region with neurogenic claudication: Secondary | ICD-10-CM | POA: Diagnosis not present

## 2012-09-17 DIAGNOSIS — M5430 Sciatica, unspecified side: Secondary | ICD-10-CM | POA: Diagnosis not present

## 2012-10-07 DIAGNOSIS — N390 Urinary tract infection, site not specified: Secondary | ICD-10-CM | POA: Diagnosis not present

## 2012-10-30 DIAGNOSIS — N3946 Mixed incontinence: Secondary | ICD-10-CM | POA: Diagnosis not present

## 2012-10-30 DIAGNOSIS — Q6239 Other obstructive defects of renal pelvis and ureter: Secondary | ICD-10-CM | POA: Diagnosis not present

## 2012-10-30 DIAGNOSIS — N3020 Other chronic cystitis without hematuria: Secondary | ICD-10-CM | POA: Diagnosis not present

## 2012-11-14 DIAGNOSIS — I2510 Atherosclerotic heart disease of native coronary artery without angina pectoris: Secondary | ICD-10-CM | POA: Diagnosis not present

## 2012-11-14 DIAGNOSIS — I739 Peripheral vascular disease, unspecified: Secondary | ICD-10-CM | POA: Diagnosis not present

## 2012-11-14 DIAGNOSIS — E785 Hyperlipidemia, unspecified: Secondary | ICD-10-CM | POA: Diagnosis not present

## 2012-11-14 DIAGNOSIS — R5381 Other malaise: Secondary | ICD-10-CM | POA: Diagnosis not present

## 2012-11-14 DIAGNOSIS — R0602 Shortness of breath: Secondary | ICD-10-CM | POA: Diagnosis not present

## 2012-11-14 DIAGNOSIS — R079 Chest pain, unspecified: Secondary | ICD-10-CM | POA: Diagnosis not present

## 2012-11-20 DIAGNOSIS — I2510 Atherosclerotic heart disease of native coronary artery without angina pectoris: Secondary | ICD-10-CM | POA: Diagnosis not present

## 2012-11-20 DIAGNOSIS — I10 Essential (primary) hypertension: Secondary | ICD-10-CM | POA: Diagnosis not present

## 2012-11-20 DIAGNOSIS — R072 Precordial pain: Secondary | ICD-10-CM | POA: Diagnosis not present

## 2012-11-20 DIAGNOSIS — I739 Peripheral vascular disease, unspecified: Secondary | ICD-10-CM | POA: Diagnosis not present

## 2012-11-20 DIAGNOSIS — R0789 Other chest pain: Secondary | ICD-10-CM | POA: Diagnosis not present

## 2012-11-21 DIAGNOSIS — R0789 Other chest pain: Secondary | ICD-10-CM | POA: Diagnosis not present

## 2012-11-21 DIAGNOSIS — I2510 Atherosclerotic heart disease of native coronary artery without angina pectoris: Secondary | ICD-10-CM | POA: Diagnosis not present

## 2012-11-21 DIAGNOSIS — R0602 Shortness of breath: Secondary | ICD-10-CM | POA: Diagnosis not present

## 2012-11-21 DIAGNOSIS — I739 Peripheral vascular disease, unspecified: Secondary | ICD-10-CM | POA: Diagnosis not present

## 2012-11-21 DIAGNOSIS — R072 Precordial pain: Secondary | ICD-10-CM | POA: Diagnosis not present

## 2012-11-21 DIAGNOSIS — I10 Essential (primary) hypertension: Secondary | ICD-10-CM | POA: Diagnosis not present

## 2012-11-21 DIAGNOSIS — R002 Palpitations: Secondary | ICD-10-CM | POA: Diagnosis not present

## 2012-12-03 DIAGNOSIS — E785 Hyperlipidemia, unspecified: Secondary | ICD-10-CM | POA: Diagnosis not present

## 2012-12-03 DIAGNOSIS — I70219 Atherosclerosis of native arteries of extremities with intermittent claudication, unspecified extremity: Secondary | ICD-10-CM | POA: Diagnosis not present

## 2012-12-03 DIAGNOSIS — I209 Angina pectoris, unspecified: Secondary | ICD-10-CM | POA: Diagnosis not present

## 2012-12-03 DIAGNOSIS — K219 Gastro-esophageal reflux disease without esophagitis: Secondary | ICD-10-CM | POA: Diagnosis not present

## 2012-12-03 DIAGNOSIS — Z23 Encounter for immunization: Secondary | ICD-10-CM | POA: Diagnosis not present

## 2012-12-10 DIAGNOSIS — I10 Essential (primary) hypertension: Secondary | ICD-10-CM | POA: Diagnosis not present

## 2012-12-10 DIAGNOSIS — I70219 Atherosclerosis of native arteries of extremities with intermittent claudication, unspecified extremity: Secondary | ICD-10-CM | POA: Diagnosis not present

## 2012-12-10 DIAGNOSIS — M549 Dorsalgia, unspecified: Secondary | ICD-10-CM | POA: Diagnosis not present

## 2012-12-10 DIAGNOSIS — I739 Peripheral vascular disease, unspecified: Secondary | ICD-10-CM | POA: Diagnosis not present

## 2012-12-12 DIAGNOSIS — Q6239 Other obstructive defects of renal pelvis and ureter: Secondary | ICD-10-CM | POA: Diagnosis not present

## 2012-12-12 DIAGNOSIS — N390 Urinary tract infection, site not specified: Secondary | ICD-10-CM | POA: Diagnosis not present

## 2013-02-14 DIAGNOSIS — E063 Autoimmune thyroiditis: Secondary | ICD-10-CM | POA: Diagnosis not present

## 2013-02-14 DIAGNOSIS — Z809 Family history of malignant neoplasm, unspecified: Secondary | ICD-10-CM | POA: Diagnosis not present

## 2013-02-14 DIAGNOSIS — E039 Hypothyroidism, unspecified: Secondary | ICD-10-CM | POA: Diagnosis not present

## 2013-02-14 DIAGNOSIS — E890 Postprocedural hypothyroidism: Secondary | ICD-10-CM | POA: Diagnosis not present

## 2013-02-20 DIAGNOSIS — I10 Essential (primary) hypertension: Secondary | ICD-10-CM | POA: Diagnosis not present

## 2013-02-20 DIAGNOSIS — R55 Syncope and collapse: Secondary | ICD-10-CM | POA: Diagnosis not present

## 2013-02-20 DIAGNOSIS — R079 Chest pain, unspecified: Secondary | ICD-10-CM | POA: Diagnosis not present

## 2013-02-20 DIAGNOSIS — I6350 Cerebral infarction due to unspecified occlusion or stenosis of unspecified cerebral artery: Secondary | ICD-10-CM | POA: Diagnosis not present

## 2013-02-20 DIAGNOSIS — I739 Peripheral vascular disease, unspecified: Secondary | ICD-10-CM | POA: Diagnosis not present

## 2013-02-21 DIAGNOSIS — E039 Hypothyroidism, unspecified: Secondary | ICD-10-CM | POA: Diagnosis not present

## 2013-02-21 DIAGNOSIS — E890 Postprocedural hypothyroidism: Secondary | ICD-10-CM | POA: Diagnosis not present

## 2013-02-21 DIAGNOSIS — E063 Autoimmune thyroiditis: Secondary | ICD-10-CM | POA: Diagnosis not present

## 2013-02-21 DIAGNOSIS — K219 Gastro-esophageal reflux disease without esophagitis: Secondary | ICD-10-CM | POA: Diagnosis not present

## 2013-02-24 DIAGNOSIS — I10 Essential (primary) hypertension: Secondary | ICD-10-CM | POA: Diagnosis not present

## 2013-02-24 DIAGNOSIS — I739 Peripheral vascular disease, unspecified: Secondary | ICD-10-CM | POA: Diagnosis not present

## 2013-02-24 DIAGNOSIS — R079 Chest pain, unspecified: Secondary | ICD-10-CM | POA: Diagnosis not present

## 2013-02-24 DIAGNOSIS — R55 Syncope and collapse: Secondary | ICD-10-CM | POA: Diagnosis not present

## 2013-02-26 ENCOUNTER — Ambulatory Visit: Payer: Self-pay | Admitting: Cardiovascular Disease

## 2013-02-26 DIAGNOSIS — I6350 Cerebral infarction due to unspecified occlusion or stenosis of unspecified cerebral artery: Secondary | ICD-10-CM | POA: Diagnosis not present

## 2013-02-26 DIAGNOSIS — Z8673 Personal history of transient ischemic attack (TIA), and cerebral infarction without residual deficits: Secondary | ICD-10-CM | POA: Diagnosis not present

## 2013-03-04 DIAGNOSIS — J310 Chronic rhinitis: Secondary | ICD-10-CM | POA: Diagnosis not present

## 2013-03-04 DIAGNOSIS — N181 Chronic kidney disease, stage 1: Secondary | ICD-10-CM | POA: Diagnosis not present

## 2013-03-04 DIAGNOSIS — G459 Transient cerebral ischemic attack, unspecified: Secondary | ICD-10-CM | POA: Diagnosis not present

## 2013-03-04 DIAGNOSIS — I129 Hypertensive chronic kidney disease with stage 1 through stage 4 chronic kidney disease, or unspecified chronic kidney disease: Secondary | ICD-10-CM | POA: Diagnosis not present

## 2013-03-10 DIAGNOSIS — H43819 Vitreous degeneration, unspecified eye: Secondary | ICD-10-CM | POA: Diagnosis not present

## 2013-03-13 DIAGNOSIS — I639 Cerebral infarction, unspecified: Secondary | ICD-10-CM

## 2013-03-13 HISTORY — DX: Cerebral infarction, unspecified: I63.9

## 2013-03-21 DIAGNOSIS — E785 Hyperlipidemia, unspecified: Secondary | ICD-10-CM | POA: Diagnosis not present

## 2013-03-21 DIAGNOSIS — I2510 Atherosclerotic heart disease of native coronary artery without angina pectoris: Secondary | ICD-10-CM | POA: Diagnosis not present

## 2013-03-21 DIAGNOSIS — I739 Peripheral vascular disease, unspecified: Secondary | ICD-10-CM | POA: Diagnosis not present

## 2013-03-21 DIAGNOSIS — I10 Essential (primary) hypertension: Secondary | ICD-10-CM | POA: Diagnosis not present

## 2013-03-21 DIAGNOSIS — R079 Chest pain, unspecified: Secondary | ICD-10-CM | POA: Diagnosis not present

## 2013-03-25 DIAGNOSIS — M545 Low back pain, unspecified: Secondary | ICD-10-CM | POA: Diagnosis not present

## 2013-03-25 DIAGNOSIS — M76899 Other specified enthesopathies of unspecified lower limb, excluding foot: Secondary | ICD-10-CM | POA: Diagnosis not present

## 2013-04-07 DIAGNOSIS — N1330 Unspecified hydronephrosis: Secondary | ICD-10-CM | POA: Diagnosis not present

## 2013-04-07 DIAGNOSIS — R040 Epistaxis: Secondary | ICD-10-CM | POA: Diagnosis not present

## 2013-04-07 DIAGNOSIS — E785 Hyperlipidemia, unspecified: Secondary | ICD-10-CM | POA: Diagnosis not present

## 2013-04-07 DIAGNOSIS — E8881 Metabolic syndrome: Secondary | ICD-10-CM | POA: Diagnosis not present

## 2013-04-14 DIAGNOSIS — R350 Frequency of micturition: Secondary | ICD-10-CM | POA: Diagnosis not present

## 2013-04-14 DIAGNOSIS — N390 Urinary tract infection, site not specified: Secondary | ICD-10-CM | POA: Diagnosis not present

## 2013-04-14 DIAGNOSIS — R109 Unspecified abdominal pain: Secondary | ICD-10-CM | POA: Diagnosis not present

## 2013-04-21 DIAGNOSIS — J449 Chronic obstructive pulmonary disease, unspecified: Secondary | ICD-10-CM | POA: Diagnosis not present

## 2013-04-21 DIAGNOSIS — I739 Peripheral vascular disease, unspecified: Secondary | ICD-10-CM | POA: Diagnosis not present

## 2013-04-21 DIAGNOSIS — I10 Essential (primary) hypertension: Secondary | ICD-10-CM | POA: Diagnosis not present

## 2013-04-21 DIAGNOSIS — R0602 Shortness of breath: Secondary | ICD-10-CM | POA: Diagnosis not present

## 2013-04-21 DIAGNOSIS — I2510 Atherosclerotic heart disease of native coronary artery without angina pectoris: Secondary | ICD-10-CM | POA: Diagnosis not present

## 2013-04-21 DIAGNOSIS — E785 Hyperlipidemia, unspecified: Secondary | ICD-10-CM | POA: Diagnosis not present

## 2013-05-27 DIAGNOSIS — M5430 Sciatica, unspecified side: Secondary | ICD-10-CM | POA: Diagnosis not present

## 2013-06-04 DIAGNOSIS — IMO0002 Reserved for concepts with insufficient information to code with codable children: Secondary | ICD-10-CM | POA: Diagnosis not present

## 2013-06-10 DIAGNOSIS — I739 Peripheral vascular disease, unspecified: Secondary | ICD-10-CM | POA: Diagnosis not present

## 2013-06-10 DIAGNOSIS — M79609 Pain in unspecified limb: Secondary | ICD-10-CM | POA: Diagnosis not present

## 2013-06-10 DIAGNOSIS — I10 Essential (primary) hypertension: Secondary | ICD-10-CM | POA: Diagnosis not present

## 2013-06-10 DIAGNOSIS — I70219 Atherosclerosis of native arteries of extremities with intermittent claudication, unspecified extremity: Secondary | ICD-10-CM | POA: Diagnosis not present

## 2013-06-12 DIAGNOSIS — M48062 Spinal stenosis, lumbar region with neurogenic claudication: Secondary | ICD-10-CM | POA: Diagnosis not present

## 2013-06-19 ENCOUNTER — Encounter: Payer: Self-pay | Admitting: Cardiovascular Disease

## 2013-06-19 ENCOUNTER — Encounter (INDEPENDENT_AMBULATORY_CARE_PROVIDER_SITE_OTHER): Payer: Self-pay

## 2013-06-19 ENCOUNTER — Ambulatory Visit (INDEPENDENT_AMBULATORY_CARE_PROVIDER_SITE_OTHER): Payer: Medicare Other | Admitting: Cardiovascular Disease

## 2013-06-19 VITALS — BP 108/80 | HR 61 | Ht 66.0 in | Wt 225.5 lb

## 2013-06-19 DIAGNOSIS — R0602 Shortness of breath: Secondary | ICD-10-CM | POA: Diagnosis not present

## 2013-06-19 DIAGNOSIS — M549 Dorsalgia, unspecified: Secondary | ICD-10-CM

## 2013-06-19 DIAGNOSIS — Z9889 Other specified postprocedural states: Secondary | ICD-10-CM

## 2013-06-19 DIAGNOSIS — E785 Hyperlipidemia, unspecified: Secondary | ICD-10-CM

## 2013-06-19 DIAGNOSIS — R0609 Other forms of dyspnea: Secondary | ICD-10-CM | POA: Insufficient documentation

## 2013-06-19 DIAGNOSIS — I2510 Atherosclerotic heart disease of native coronary artery without angina pectoris: Secondary | ICD-10-CM | POA: Insufficient documentation

## 2013-06-19 DIAGNOSIS — I739 Peripheral vascular disease, unspecified: Secondary | ICD-10-CM | POA: Insufficient documentation

## 2013-06-19 DIAGNOSIS — G8929 Other chronic pain: Secondary | ICD-10-CM | POA: Insufficient documentation

## 2013-06-19 NOTE — Assessment & Plan Note (Signed)
Currently with no symptoms of angina. No further workup at this time. Continue current medication regimen. Past catheterization in 2013

## 2013-06-19 NOTE — Assessment & Plan Note (Signed)
We have encouraged continued exercise, careful diet management in an effort to lose weight. 

## 2013-06-19 NOTE — Assessment & Plan Note (Signed)
She is scheduled for back surgery next week in [facility]. Acceptable risk with no further testing needed

## 2013-06-19 NOTE — Patient Instructions (Signed)
You are doing well. Please cut the losartan HCTZ in 1/2 daily (your blood pressure is low)  Please call us if you have new issues that need to be addressed before your next appt.  Your physician wants you to follow-up in: 6 months.  You will receive a reminder letter in the mail two months in advance. If you don't receive a letter, please call our office to schedule the follow-up appointment.

## 2013-06-19 NOTE — Assessment & Plan Note (Signed)
We'll try to obtain a lipid panel from over the past year. If none has been done, we can order this. Goal LDL less than 70

## 2013-06-19 NOTE — Assessment & Plan Note (Signed)
Shortness of breath likely from obesity, deconditioning, chronic pain. Recommended she start exercise program after her back surgery

## 2013-06-19 NOTE — Progress Notes (Signed)
Patient ID: Tammy [name], female    DOB: [birth year], 68 y.o.   MRN: [id number]  HPI Comments: Tammy [name] is a 68 year old woman with history of hypertension, back surgery January 2014 with rods placed at that time, remote smoking history stopped 5 years ago, history of obesity, deconditioned secondary to chronic back pain who presents to establish care in the office. Other past medical that were noted on outside office paperwork but not discussed with her is PAD with angioplasty of the legs bilaterally,, COPD, chronic kidney disease  She reports having prior heart attack catheterizations in 2012 and 2013. Catheterization in 2012 documented occluded mid RCA with left to right collaterals, 40% mid LAD disease, 40% mid circumflex disease, 30% proximal LAD disease  Catheterization in September 2013 showed normal LV function, occluded mid RCA with left to right collaterals, 50% mid and distal LAD disease  Stress test September 2014 showing very small anterior wall defect likely secondary to breast attenuation artifact Echocardiogram May 2014 showing low normal ejection fraction, mild TR, mild pulmonary hypertension  Most recent lipid panel unavailable. She reports that she has stopped Plavix on her own as well as aspirin in preparation for the surgery She is scheduled for minimally invasive back surgery next Wednesday in [place] [facility] by [name] She is hoping that this will help her back pain She has not tried other modalities such as water aerobics.  EKG shows normal sinus rhythm with rate 61 beats per minute, no significant ST or T wave changes  Prior total cholesterol July 2013 was 262, LDL 167, HDL 57   Outpatient Encounter Prescriptions as of 06/19/2013  Medication Sig  . albuterol (PROVENTIL HFA;VENTOLIN HFA) 108 (90 BASE) MCG/ACT inhaler Inhale 2 puffs into the lungs every 6 (six) hours as needed for wheezing or shortness of breath.  [name] aspirin 81 MG  tablet Take 81 mg by mouth daily.  . cetirizine (ZYRTEC) 10 MG tablet Take 10 mg by mouth daily.  . cholecalciferol (VITAMIN D) 1000 UNITS tablet Take 2,000 Units by mouth daily.  . clopidogrel (PLAVIX) 75 MG tablet Take 75 mg by mouth daily with breakfast.  . furosemide (LASIX) 40 MG tablet Take 20 mg by mouth daily.   [name] gabapentin (NEURONTIN) 600 MG tablet Take 600 mg by mouth at bedtime.  . isosorbide mononitrate (IMDUR) 120 MG 24 hr tablet Take 120 mg by mouth daily.   [name] losartan-hydrochlorothiazide (HYZAAR) 100-12.5 MG per tablet Take 1 tablet by mouth daily.  . metoprolol succinate (TOPROL-XL) 25 MG 24 hr tablet Take 25 mg by mouth daily.  . nitroGLYCERIN (NITROSTAT) 0.4 MG SL tablet Place 0.4 mg under the tongue every 5 (five) minutes as needed for chest pain.  . pantoprazole (PROTONIX) 40 MG tablet Take 40 mg by mouth daily.  . ranitidine (ZANTAC) 150 MG capsule Take 150 mg by mouth every morning.  . simvastatin (ZOCOR) 40 MG tablet Take 40 mg by mouth daily.  . solifenacin (VESICARE) 10 MG tablet Take 5 mg by mouth daily.   [name] SYNTHROID 112 MCG tablet Take 112 mcg by mouth daily before breakfast.   . [DISCONTINUED] gabapentin (NEURONTIN) 100 MG capsule Take 100 mg by mouth 3 (three) times daily.   . [DISCONTINUED] sucralfate (CARAFATE) 1 G tablet Take 1 g by mouth.      Review of Systems  Constitutional: Negative.   HENT: Negative.   Eyes: Negative.   Respiratory: Negative.   Cardiovascular: Negative.  Gastrointestinal: Negative.   Endocrine: Negative.   Musculoskeletal: Positive for back pain.  Skin: Negative.   Allergic/Immunologic: Negative.   Neurological: Negative.   Hematological: Negative.   Psychiatric/Behavioral: Negative.   All other systems reviewed and are negative.   BP 108/80  Pulse 61  Ht 5\' 6"  (1.676 m)  Wt 225 lb 8 oz (102.286 kg)  BMI 36.41 kg/m2  Physical Exam  Nursing note and vitals reviewed. Constitutional: She is oriented to person, place,  and time. She appears well-developed and well-nourished.  Obese  HENT:  Head: Normocephalic.  Nose: Nose normal.  Mouth/Throat: Oropharynx is clear and moist.  Eyes: Conjunctivae are normal. Pupils are equal, round, and reactive to light.  Neck: Normal range of motion. Neck supple. No JVD present.  Cardiovascular: Normal rate, regular rhythm, S1 normal, S2 normal, normal heart sounds and intact distal pulses.  Exam reveals no gallop and no friction rub.   No murmur heard. Pulmonary/Chest: Effort normal and breath sounds normal. No respiratory distress. She has no wheezes. She has no rales. She exhibits no tenderness.  Abdominal: Soft. Bowel sounds are normal. She exhibits no distension. There is no tenderness.  Musculoskeletal: She exhibits no edema and no tenderness.  Lymphadenopathy:    She has no cervical adenopathy.  Neurological: She is alert and oriented to person, place, and time. Coordination normal.  Skin: Skin is warm and dry. No rash noted. No erythema.  Psychiatric: She has a normal mood and affect. Her behavior is normal. Judgment and thought content normal.    Assessment and Plan

## 2013-06-19 NOTE — Assessment & Plan Note (Signed)
Outside notes indicating angioplasty with stenting to her legs. We will try to obtain records for our system.

## 2013-06-23 DIAGNOSIS — N1330 Unspecified hydronephrosis: Secondary | ICD-10-CM | POA: Diagnosis not present

## 2013-06-23 DIAGNOSIS — J019 Acute sinusitis, unspecified: Secondary | ICD-10-CM | POA: Diagnosis not present

## 2013-06-23 DIAGNOSIS — E8881 Metabolic syndrome: Secondary | ICD-10-CM | POA: Diagnosis not present

## 2013-06-23 DIAGNOSIS — E785 Hyperlipidemia, unspecified: Secondary | ICD-10-CM | POA: Diagnosis not present

## 2013-06-25 DIAGNOSIS — I495 Sick sinus syndrome: Secondary | ICD-10-CM | POA: Diagnosis not present

## 2013-06-25 DIAGNOSIS — Z7902 Long term (current) use of antithrombotics/antiplatelets: Secondary | ICD-10-CM | POA: Diagnosis not present

## 2013-06-25 DIAGNOSIS — Z419 Encounter for procedure for purposes other than remedying health state, unspecified: Secondary | ICD-10-CM | POA: Diagnosis not present

## 2013-06-25 DIAGNOSIS — M48062 Spinal stenosis, lumbar region with neurogenic claudication: Secondary | ICD-10-CM | POA: Diagnosis not present

## 2013-06-25 DIAGNOSIS — N189 Chronic kidney disease, unspecified: Secondary | ICD-10-CM | POA: Diagnosis not present

## 2013-06-25 DIAGNOSIS — Z8673 Personal history of transient ischemic attack (TIA), and cerebral infarction without residual deficits: Secondary | ICD-10-CM | POA: Diagnosis not present

## 2013-06-25 DIAGNOSIS — R209 Unspecified disturbances of skin sensation: Secondary | ICD-10-CM | POA: Diagnosis not present

## 2013-06-25 DIAGNOSIS — Z79899 Other long term (current) drug therapy: Secondary | ICD-10-CM | POA: Diagnosis not present

## 2013-06-25 DIAGNOSIS — Z7982 Long term (current) use of aspirin: Secondary | ICD-10-CM | POA: Diagnosis not present

## 2013-06-25 DIAGNOSIS — I129 Hypertensive chronic kidney disease with stage 1 through stage 4 chronic kidney disease, or unspecified chronic kidney disease: Secondary | ICD-10-CM | POA: Diagnosis not present

## 2013-09-22 DIAGNOSIS — K5730 Diverticulosis of large intestine without perforation or abscess without bleeding: Secondary | ICD-10-CM | POA: Diagnosis not present

## 2013-09-22 DIAGNOSIS — E785 Hyperlipidemia, unspecified: Secondary | ICD-10-CM | POA: Diagnosis not present

## 2013-09-22 DIAGNOSIS — I10 Essential (primary) hypertension: Secondary | ICD-10-CM | POA: Diagnosis not present

## 2013-09-23 DIAGNOSIS — K5732 Diverticulitis of large intestine without perforation or abscess without bleeding: Secondary | ICD-10-CM | POA: Diagnosis not present

## 2013-09-23 DIAGNOSIS — E785 Hyperlipidemia, unspecified: Secondary | ICD-10-CM | POA: Diagnosis not present

## 2013-09-23 DIAGNOSIS — M5430 Sciatica, unspecified side: Secondary | ICD-10-CM | POA: Diagnosis not present

## 2013-09-23 DIAGNOSIS — N1330 Unspecified hydronephrosis: Secondary | ICD-10-CM | POA: Diagnosis not present

## 2013-09-23 DIAGNOSIS — K219 Gastro-esophageal reflux disease without esophagitis: Secondary | ICD-10-CM | POA: Diagnosis not present

## 2013-10-04 DIAGNOSIS — M48061 Spinal stenosis, lumbar region without neurogenic claudication: Secondary | ICD-10-CM | POA: Diagnosis not present

## 2013-10-09 DIAGNOSIS — M48062 Spinal stenosis, lumbar region with neurogenic claudication: Secondary | ICD-10-CM | POA: Diagnosis not present

## 2013-10-21 ENCOUNTER — Telehealth: Payer: Self-pay

## 2013-10-21 NOTE — Telephone Encounter (Signed)
Faxed Blood Thinner Clearance to [facility] at [phone] for pt to be off of Plavix 5 days prior to epidural steroid injection.

## 2013-10-22 ENCOUNTER — Telehealth: Payer: Self-pay | Admitting: *Deleted

## 2013-10-22 NOTE — Telephone Encounter (Signed)
Patient called regarding if she needs to stop her Plavix for spinal injection. Please send note to [facility] attn: Mickel Baas tele# [phone]  Fax# [phone]

## 2013-10-22 NOTE — Telephone Encounter (Signed)
This was faxed yesterday.  Will refax.

## 2013-10-28 DIAGNOSIS — I209 Angina pectoris, unspecified: Secondary | ICD-10-CM | POA: Diagnosis not present

## 2013-10-28 DIAGNOSIS — K219 Gastro-esophageal reflux disease without esophagitis: Secondary | ICD-10-CM | POA: Diagnosis not present

## 2013-10-28 DIAGNOSIS — E785 Hyperlipidemia, unspecified: Secondary | ICD-10-CM | POA: Diagnosis not present

## 2013-10-28 DIAGNOSIS — K5732 Diverticulitis of large intestine without perforation or abscess without bleeding: Secondary | ICD-10-CM | POA: Diagnosis not present

## 2013-10-30 DIAGNOSIS — M961 Postlaminectomy syndrome, not elsewhere classified: Secondary | ICD-10-CM | POA: Diagnosis not present

## 2013-10-30 DIAGNOSIS — G8929 Other chronic pain: Secondary | ICD-10-CM | POA: Diagnosis not present

## 2013-10-30 DIAGNOSIS — M545 Low back pain, unspecified: Secondary | ICD-10-CM | POA: Diagnosis not present

## 2013-10-30 DIAGNOSIS — M5137 Other intervertebral disc degeneration, lumbosacral region: Secondary | ICD-10-CM | POA: Diagnosis not present

## 2013-10-30 DIAGNOSIS — IMO0002 Reserved for concepts with insufficient information to code with codable children: Secondary | ICD-10-CM | POA: Diagnosis not present

## 2013-11-03 DIAGNOSIS — N318 Other neuromuscular dysfunction of bladder: Secondary | ICD-10-CM | POA: Diagnosis not present

## 2013-11-03 DIAGNOSIS — N390 Urinary tract infection, site not specified: Secondary | ICD-10-CM | POA: Insufficient documentation

## 2013-11-03 DIAGNOSIS — N3281 Overactive bladder: Secondary | ICD-10-CM | POA: Insufficient documentation

## 2013-11-03 DIAGNOSIS — Q6239 Other obstructive defects of renal pelvis and ureter: Secondary | ICD-10-CM | POA: Diagnosis not present

## 2013-11-08 DIAGNOSIS — M25579 Pain in unspecified ankle and joints of unspecified foot: Secondary | ICD-10-CM | POA: Diagnosis not present

## 2013-11-20 DIAGNOSIS — IMO0002 Reserved for concepts with insufficient information to code with codable children: Secondary | ICD-10-CM | POA: Diagnosis not present

## 2013-11-20 DIAGNOSIS — M545 Low back pain, unspecified: Secondary | ICD-10-CM | POA: Diagnosis not present

## 2013-11-25 DIAGNOSIS — M545 Low back pain, unspecified: Secondary | ICD-10-CM | POA: Diagnosis not present

## 2013-12-11 DIAGNOSIS — I2699 Other pulmonary embolism without acute cor pulmonale: Secondary | ICD-10-CM

## 2013-12-11 HISTORY — DX: Other pulmonary embolism without acute cor pulmonale: I26.99

## 2013-12-16 ENCOUNTER — Encounter: Payer: Self-pay | Admitting: Cardiovascular Disease

## 2013-12-16 ENCOUNTER — Ambulatory Visit (INDEPENDENT_AMBULATORY_CARE_PROVIDER_SITE_OTHER): Payer: Medicare Other | Admitting: Cardiovascular Disease

## 2013-12-16 VITALS — BP 108/70 | HR 122 | Ht 66.0 in | Wt 223.0 lb

## 2013-12-16 DIAGNOSIS — R0602 Shortness of breath: Secondary | ICD-10-CM

## 2013-12-16 DIAGNOSIS — I129 Hypertensive chronic kidney disease with stage 1 through stage 4 chronic kidney disease, or unspecified chronic kidney disease: Secondary | ICD-10-CM | POA: Insufficient documentation

## 2013-12-16 DIAGNOSIS — I10 Essential (primary) hypertension: Secondary | ICD-10-CM

## 2013-12-16 DIAGNOSIS — I2510 Atherosclerotic heart disease of native coronary artery without angina pectoris: Secondary | ICD-10-CM

## 2013-12-16 DIAGNOSIS — N183 Chronic kidney disease, stage 3 unspecified: Secondary | ICD-10-CM

## 2013-12-16 DIAGNOSIS — N189 Chronic kidney disease, unspecified: Secondary | ICD-10-CM | POA: Insufficient documentation

## 2013-12-16 DIAGNOSIS — E1165 Type 2 diabetes mellitus with hyperglycemia: Secondary | ICD-10-CM | POA: Insufficient documentation

## 2013-12-16 DIAGNOSIS — I471 Supraventricular tachycardia: Secondary | ICD-10-CM | POA: Diagnosis not present

## 2013-12-16 DIAGNOSIS — R079 Chest pain, unspecified: Secondary | ICD-10-CM | POA: Diagnosis not present

## 2013-12-16 DIAGNOSIS — E118 Type 2 diabetes mellitus with unspecified complications: Secondary | ICD-10-CM

## 2013-12-16 DIAGNOSIS — R000 Tachycardia, unspecified: Secondary | ICD-10-CM | POA: Insufficient documentation

## 2013-12-16 DIAGNOSIS — IMO0002 Reserved for concepts with insufficient information to code with codable children: Secondary | ICD-10-CM

## 2013-12-16 DIAGNOSIS — E785 Hyperlipidemia, unspecified: Secondary | ICD-10-CM

## 2013-12-16 MED ORDER — METOPROLOL SUCCINATE ER 50 MG PO TB24: 50.0000 mg | ORAL_TABLET | Freq: Every day | ORAL | Status: DC

## 2013-12-16 MED ORDER — ISOSORBIDE MONONITRATE ER 60 MG PO TB24: 120.0000 mg | ORAL_TABLET | Freq: Every day | ORAL | Status: DC

## 2013-12-16 NOTE — Assessment & Plan Note (Signed)
We have encouraged continued exercise, careful diet management in an effort to lose weight. 

## 2013-12-16 NOTE — Assessment & Plan Note (Signed)
Currently with no symptoms of angina. No further workup at this time. Continue current medication regimen. 

## 2013-12-16 NOTE — Assessment & Plan Note (Signed)
We have recommended that she increase her metoprolol succinate up to 50 mg twice a day

## 2013-12-16 NOTE — Assessment & Plan Note (Signed)
Encouraged her to stay on her simvastatin

## 2013-12-16 NOTE — Assessment & Plan Note (Signed)
To avoid hypotension with the increased dose of metoprolol, Please hold the lasix/furosemide Please cut the isosorbide in 1/2 Please increase the metoprolol up to 50 mg twice a day

## 2013-12-16 NOTE — Patient Instructions (Addendum)
Your heart rate is elevated  Please hold the lasix/furosemide Please cut the isosorbide in 1/2 Please increase the metoprolol up to 50 mg twice a day  Please monitor your blood pressure and heart rate daily Please call with numbers  Please call us if you have new issues that need to be addressed before your next appt.  Your physician wants you to follow-up in: 1 month.    Dr. Adella [facility] [phone]

## 2013-12-16 NOTE — Progress Notes (Signed)
Patient ID: Tammy [name], female    DOB: 1945-10-14, 68 y.o.   MRN: [id number]  HPI Comments: Tammy [name] is a 68 year old woman with history of coronary artery disease, occluded mid RCA, PAD, hypertension, back surgery January 2014 with rods placed at that time, remote smoking history stopped 5 years ago, history of obesity, deconditioned secondary to chronic back pain who presents for routine followup  PAD with angioplasty of the legs bilaterally,, COPD, chronic kidney disease Lab work indicating she has diabetes type 2, uncontrolled  She reports having prior heart attack catheterizations in 2012 and 2013. Catheterization in 2012 documented occluded mid RCA with left to right collaterals, 40% mid LAD disease, 40% mid circumflex disease, 30% proximal LAD disease  Catheterization in September 2013 showed normal LV function, occluded mid RCA with left to right collaterals, 50% mid and distal LAD disease  Stress test September 2014 showing very small anterior wall defect likely secondary to breast attenuation artifact Echocardiogram May 2014 showing low normal ejection fraction, mild TR, mild pulmonary hypertension  In followup today, she reports that she has shortness of breath with exertion. Also feels dizzy when she stands, occasionally sees double. Often has nausea, feels sick to her stomach after eating. Occasional episodes of stomach pain. Heart rate has been running high. This has been going on for several weeks  Previous lab work from [facility] was reviewed showing hemoglobin A1c 7.6, fasting sugar typically 150 up to 190 Creatinine appears to be running 1.7 She was unaware that she is diabetic  EKG shows sinus tachycardia with rate 122 beats per minute,  nonspecific ST abnormality   Prior total cholesterol July 2013 was 262, LDL 167, HDL 57   Outpatient Encounter Prescriptions as of 12/16/2013  Medication Sig  . albuterol (PROVENTIL HFA;VENTOLIN HFA) 108 (90 BASE) MCG/ACT inhaler  Inhale 2 puffs into the lungs every 6 (six) hours as needed for wheezing or shortness of breath.  [name] aspirin 81 MG tablet Take 81 mg by mouth daily.  . cetirizine (ZYRTEC) 10 MG tablet Take 10 mg by mouth daily.  . cholecalciferol (VITAMIN D) 1000 UNITS tablet Take 2,000 Units by mouth daily.  . clopidogrel (PLAVIX) 75 MG tablet Take 75 mg by mouth daily with breakfast.  . furosemide (LASIX) 40 MG tablet Take 20 mg by mouth daily.   [name] gabapentin (NEURONTIN) 600 MG tablet Take 600 mg by mouth at bedtime.  . isosorbide mononitrate (IMDUR) 120 MG 24 hr tablet Take 120 mg by mouth daily.   [name] losartan-hydrochlorothiazide (HYZAAR) 100-12.5 MG per tablet Take 1 tablet by mouth daily.  . metoprolol succinate (TOPROL-XL) 25 MG 24 hr tablet Take 25 mg by mouth daily.  . nitroGLYCERIN (NITROSTAT) 0.4 MG SL tablet Place 0.4 mg under the tongue every 5 (five) minutes as needed for chest pain.  . pantoprazole (PROTONIX) 40 MG tablet Take 40 mg by mouth daily.  . ranitidine (ZANTAC) 150 MG capsule Take 150 mg by mouth every morning.  . simvastatin (ZOCOR) 40 MG tablet Take 40 mg by mouth daily.  [name] SYNTHROID 112 MCG tablet Take 112 mcg by mouth daily before breakfast.     Review of Systems  HENT: Negative.   Eyes: Negative.   Respiratory: Positive for shortness of breath.   Cardiovascular: Negative.   Gastrointestinal: Positive for nausea and abdominal pain.  Endocrine: Negative.   Musculoskeletal: Positive for back pain.  Skin: Negative.   Allergic/Immunologic: Negative.   Neurological: Positive for dizziness and weakness.  Hematological: Negative.   Psychiatric/Behavioral: Negative.   All other systems reviewed and are negative.   BP 108/70  Pulse 122  Ht 5\' 6"  (1.676 m)  Wt 223 lb (101.152 kg)  BMI 36.01 kg/m2  Physical Exam  Nursing note and vitals reviewed. Constitutional: She is oriented to person, place, and time. She appears well-developed and well-nourished.  Obese  HENT:  Head:  Normocephalic.  Nose: Nose normal.  Mouth/Throat: Oropharynx is clear and moist.  Eyes: Conjunctivae are normal. Pupils are equal, round, and reactive to light.  Neck: Normal range of motion. Neck supple. No JVD present.  Cardiovascular: Normal rate, regular rhythm, S1 normal, S2 normal, normal heart sounds and intact distal pulses.  Exam reveals no gallop and no friction rub.   No murmur heard. Pulmonary/Chest: Effort normal and breath sounds normal. No respiratory distress. She has no wheezes. She has no rales. She exhibits no tenderness.  Abdominal: Soft. Bowel sounds are normal. She exhibits no distension. There is no tenderness.  Musculoskeletal: She exhibits no edema and no tenderness.  Lymphadenopathy:    She has no cervical adenopathy.  Neurological: She is alert and oriented to person, place, and time. Coordination normal.  Skin: Skin is warm and dry. No rash noted. No erythema.  Psychiatric: She has a normal mood and affect. Her behavior is normal. Judgment and thought content normal.    Assessment and Plan

## 2013-12-16 NOTE — Assessment & Plan Note (Signed)
We'll try to obtain the most recent lab work from primary care. Prior lab work from [facility] documented hemoglobin A1c 7.6, sugars typically 150-190. She's not on any medication

## 2013-12-16 NOTE — Assessment & Plan Note (Addendum)
Prior lab work done at [facility] shows creatinine 1.7. We'll hold her Lasix as she does not have signs of CHF Recheck blood work in several weeks' time

## 2013-12-16 NOTE — Assessment & Plan Note (Signed)
Shortness of breath with exertion likely secondary to sinus tachycardia. Unable to exclude a pulmonary embolism We will increase the metoprolol for heart rate control close followup of her symptoms. If no improvement, we will order a chest CT scan

## 2013-12-17 DIAGNOSIS — Z23 Encounter for immunization: Secondary | ICD-10-CM | POA: Diagnosis not present

## 2013-12-22 ENCOUNTER — Telehealth: Payer: Self-pay

## 2013-12-22 ENCOUNTER — Ambulatory Visit (INDEPENDENT_AMBULATORY_CARE_PROVIDER_SITE_OTHER): Payer: Medicare Other | Admitting: Nurse Practitioner

## 2013-12-22 ENCOUNTER — Encounter: Payer: Self-pay | Admitting: Nurse Practitioner

## 2013-12-22 ENCOUNTER — Ambulatory Visit: Payer: Self-pay | Admitting: Nurse Practitioner

## 2013-12-22 VITALS — BP 108/60 | HR 61 | Ht 66.0 in | Wt 222.0 lb

## 2013-12-22 DIAGNOSIS — I10 Essential (primary) hypertension: Secondary | ICD-10-CM

## 2013-12-22 DIAGNOSIS — I5032 Chronic diastolic (congestive) heart failure: Secondary | ICD-10-CM

## 2013-12-22 DIAGNOSIS — I2510 Atherosclerotic heart disease of native coronary artery without angina pectoris: Secondary | ICD-10-CM | POA: Diagnosis not present

## 2013-12-22 DIAGNOSIS — R0602 Shortness of breath: Secondary | ICD-10-CM

## 2013-12-22 NOTE — Patient Instructions (Addendum)
Your physician recommends that you return for lab work:  Please have a CBC and BMP the same day as your previously scheduled lab on 12/30/13 at [place] am  Your physician has recommended you make the following change in your medication:  Please make sure you are using your inhaler four times daily if you are wheezing   Your physician has requested that you have an echocardiogram. Echocardiography is a painless test that uses sound waves to create images of your heart. It provides your doctor with information about the size and shape of your heart and how well your heart's chambers and valves are working. This procedure takes approximately one hour. There are no restrictions for this procedure.  Your physician recommends that you schedule a follow-up appointment in:  Please see Dr. [name] or Christell Faith PA the same day as your labs   Please follow up with your primary care provider about your wheezing   Please take order to the registration desk at the [place] [facility] for a chest x ray

## 2013-12-22 NOTE — Telephone Encounter (Signed)
States pt can't wait to be seen next week, needs this week.

## 2013-12-22 NOTE — Telephone Encounter (Signed)
Spoke w/ pt's husband.  He states that pt needs to be seen, she is having increased SOB.  She is sched to see Ignacia Bayley, NP this afternoon, but verbalizes understanding if sx emergent, to call 911 or proceed to nearest ED.

## 2013-12-22 NOTE — Telephone Encounter (Signed)
Pt husband states pt sugar is high and "can't hardly walk". Would like to talk with a nurse.

## 2013-12-22 NOTE — Progress Notes (Signed)
Patient Name: Tammy [name] Date of Encounter: 12/22/2013  Primary Care Provider:  Dr. Lavera Guise Primary Cardiologist:  Johnny Bridge, MD   Patient Profile  68 y/o female with h/o CAD/PAD, diast CHF, who presents back to clinic today with ongoing dyspnea.  Problem List   Past Medical History  Diagnosis Date  . Coronary artery disease     a. 11/2011 Cath: LAD 23m/d, LCX min irregs, RCA 70p, 152m with L->R collats, EF 60%-->Med Rx.  [name] Hypertension   . PVD (peripheral vascular disease)     a. PTA of right leg and left femoral artery with stenosis.  [name] COPD (chronic obstructive pulmonary disease)   . Hyperlipidemia   . CKD (chronic kidney disease), stage III   . Chronic diastolic CHF (congestive heart failure)     a. 07/2012 Echo: Nl EF. mild inferoseptal HK, Gr 2 DD, mild conc LVH, mild PR/TR, mild PAH.  . Stroke   . Palpitations   . Benign neoplasm of colon   . GERD (gastroesophageal reflux disease)   . Hypothyroidism   . History of tobacco abuse     a. Quit 2011.   Past Surgical History  Procedure Laterality Date  . Angioplasty / stenting femoral      PVD; angioplasty right leg and left femoral artery with stenosis.   . Trigger finger release    . Thyroidectomy    . Cholecystectomy    . Cardiac catheterization  12/07/2011    Mid LAD 50%, distal LAD 50%, mid RCA 70%, distal RCA 100%   . Cardiac catheterization  01/04/2011    100% occluded mid RCA with good collaterals from distal LAD, normal LVEF.  . Back surgery  03/2012    Allergies  Allergies  Allergen Reactions  . Aleve [Naproxen Sodium]     Hives & itching   . Ranexa [Ranolazine]     Sick on stomach    HPI  68 y/o female with the above problem list.  She has known h/o occlusive RCA dzs with L->R collaterals (last cath 11/2011).  She also has chronic diast CHF with nl LV fxn by echo in 07/2012.  She was last seen in clinic 6 days ago, when she saw Dr. [name] and reported a several wk h/o dyspnea on exertion.  She was  tachycardic (sinus) with a rate of 122.  Her BB dose was increased to 50mg  daily.  She was not felt to have volume overload on exam.  Unfortunately, she continues to experience dyspnea with minimal exertion.  Her wt has been stable @ home.  She has also had exp wheezing, for which she will take albuterol prn, with relief of dyspnea.  She says that this was Rx to take on a more regular basis, but she infrequently uses it. She has also had intermittent, low-level, "nagging" quality chest discomfort, occurring randomly, but never with exertion, lasting an indeterminate amount of time and resolving spontaneously.  She has not had to take sl NTG for this.  She denies palpitations, pnd, orthopnea, v, dizziness, syncope, or edema.  She has noted nausea and occasional abd discomfort after eating.    Home Medications  Prior to Admission medications   Medication Sig Start Date End Date Taking? Authorizing Provider  albuterol (PROVENTIL HFA;VENTOLIN HFA) 108 (90 BASE) MCG/ACT inhaler Inhale 2 puffs into the lungs every 6 (six) hours as needed for wheezing or shortness of breath.   Yes Historical Provider, MD  aspirin 81 MG tablet Take 81 mg  by mouth daily.   Yes Historical Provider, MD  cetirizine (ZYRTEC) 10 MG tablet Take 10 mg by mouth daily.   Yes Historical Provider, MD  cholecalciferol (VITAMIN D) 1000 UNITS tablet Take 2,000 Units by mouth daily.   Yes Historical Provider, MD  clopidogrel (PLAVIX) 75 MG tablet Take 75 mg by mouth daily with breakfast.   Yes Historical Provider, MD  gabapentin (NEURONTIN) 600 MG tablet Take 600 mg by mouth at bedtime.   Yes Historical Provider, MD  isosorbide mononitrate (IMDUR) 60 MG 24 hr tablet Take 2 tablets (120 mg total) by mouth daily. 12/16/13  Yes [name], MD  losartan-hydrochlorothiazide (HYZAAR) 100-12.5 MG per tablet Take 1 tablet by mouth daily.   Yes Historical Provider, MD  metoprolol succinate (TOPROL-XL) 50 MG 24 hr tablet Take 1 tablet (50 mg  total) by mouth daily. 12/16/13  Yes [name], MD  nitroGLYCERIN (NITROSTAT) 0.4 MG SL tablet Place 0.4 mg under the tongue every 5 (five) minutes as needed for chest pain.   Yes Historical Provider, MD  pantoprazole (PROTONIX) 40 MG tablet Take 40 mg by mouth daily.   Yes Historical Provider, MD  ranitidine (ZANTAC) 150 MG capsule Take 150 mg by mouth every morning.   Yes Historical Provider, MD  simvastatin (ZOCOR) 40 MG tablet Take 40 mg by mouth daily.   Yes Historical Provider, MD  SYNTHROID 112 MCG tablet Take 112 mcg by mouth daily before breakfast.  04/08/13  Yes Historical Provider, MD    Review of Systems  DOE with wheezing as above.  Occas nagging c/p.  All other systems reviewed and are otherwise negative except as noted above.  Physical Exam  Blood pressure 108/60, pulse 61, height 5\' 6"  (1.676 m), weight 222 lb (100.699 kg).  General: Pleasant, NAD Psych: Normal affect. Neuro: Alert and oriented X 3. Moves all extremities spontaneously. HEENT: Normal  Neck: Supple without bruits or JVD. Lungs:  Resp regular and unlabored, no crackles.  From midway->up bilat lung sounds are more diminished.  Exp wheezing noted with forced expiration. Heart: RRR no s3, s4, or murmurs. Abdomen: Soft, non-tender, non-distended, BS + x 4.  Extremities: No clubbing, cyanosis or edema. DP/PT/Radials 2+ and equal bilaterally.  Accessory Clinical Findings  ECG - RSR, 61, no acute st/t changes.  Assessment & Plan  1.  Dyspnea on Exertion:  She has a several wk h/o DOE w/o dyspnea @ rest.  She has had wheezing in association with this and has mild wheezing today.  She has no evidence of volume overload and her hr/bp are stable.  She has been afebrile and w/o a cough.  She has a long smoking hx, having quit only 4 yrs ago.  I will check a CXR today and have asked her to use her albuterol 2 puffs qid over the next week.  I suspect that she will see some improvement with this.  If so, I'd like  her to f/u with her PCP (and may ultimately require pulmonology referral) to consider alternate therapies (atrovent, spiriva, etc.).  As she does have a h/o diast chf, I will also repeat echo (last 07/2012).  She should f/u with [place] in 1 wk, at which time we can assess her response to albuterol.  If she continues to have marked DOE despite bronchodilator therapy, we may need to consider repeating an ischemic w/u (last 2013).  Sinus tachycardia resolved - doubt PE.  2.  Chronic diastolic CHF:  Wt stable and euvolemic.  HR/BP stable.  Doubt that this is significantly contributing to her dyspnea, esp in the setting of wheezing.  Cont current meds.  3.  CAD:  Known RCA dzs with L->R collats.  She reports somewhat atypical 'nagging' chest discomfort that is not associated with her DOE.  ECG non-acute.  Cont asa, plavix, bb, statin, nitrate.  If dyspnea persists despite bronchodilator therapy, would consider repeat lexiscan myoview.  4.  HTN:  Stable.  5.  HL:  On statin.  Due for fasting lipids next week.  6.  GERD:  Cont PPI.  7.  Dispo:  CXR today.  F/U next wk with cbc/bmet/fasting lipids/echo.    Murray Hodgkins, NP 12/22/2013, 3:13 PM

## 2013-12-25 ENCOUNTER — Other Ambulatory Visit: Payer: Self-pay

## 2013-12-25 DIAGNOSIS — I2699 Other pulmonary embolism without acute cor pulmonale: Secondary | ICD-10-CM

## 2013-12-25 HISTORY — DX: Other pulmonary embolism without acute cor pulmonale: I26.99

## 2013-12-25 MED ORDER — ISOSORBIDE MONONITRATE ER 60 MG PO TB24: 120.0000 mg | ORAL_TABLET | Freq: Every day | ORAL | Status: DC

## 2013-12-25 NOTE — Telephone Encounter (Signed)
Refill sent for isosorbide mono

## 2013-12-30 ENCOUNTER — Other Ambulatory Visit: Payer: Medicare Other

## 2013-12-30 DIAGNOSIS — M1990 Unspecified osteoarthritis, unspecified site: Secondary | ICD-10-CM | POA: Diagnosis not present

## 2013-12-30 DIAGNOSIS — J309 Allergic rhinitis, unspecified: Secondary | ICD-10-CM | POA: Diagnosis not present

## 2013-12-30 DIAGNOSIS — I739 Peripheral vascular disease, unspecified: Secondary | ICD-10-CM | POA: Diagnosis not present

## 2013-12-31 ENCOUNTER — Ambulatory Visit (INDEPENDENT_AMBULATORY_CARE_PROVIDER_SITE_OTHER): Payer: Medicare Other | Admitting: Cardiovascular Disease

## 2013-12-31 ENCOUNTER — Telehealth: Payer: Self-pay | Admitting: Cardiology

## 2013-12-31 ENCOUNTER — Other Ambulatory Visit: Payer: Medicare Other

## 2013-12-31 ENCOUNTER — Encounter: Payer: Self-pay | Admitting: Cardiovascular Disease

## 2013-12-31 VITALS — BP 114/70 | Ht 66.0 in | Wt 221.0 lb

## 2013-12-31 DIAGNOSIS — I471 Supraventricular tachycardia: Secondary | ICD-10-CM | POA: Diagnosis not present

## 2013-12-31 DIAGNOSIS — R000 Tachycardia, unspecified: Secondary | ICD-10-CM

## 2013-12-31 DIAGNOSIS — I5032 Chronic diastolic (congestive) heart failure: Secondary | ICD-10-CM | POA: Diagnosis not present

## 2013-12-31 DIAGNOSIS — E785 Hyperlipidemia, unspecified: Secondary | ICD-10-CM

## 2013-12-31 DIAGNOSIS — E1165 Type 2 diabetes mellitus with hyperglycemia: Secondary | ICD-10-CM

## 2013-12-31 DIAGNOSIS — IMO0002 Reserved for concepts with insufficient information to code with codable children: Secondary | ICD-10-CM

## 2013-12-31 DIAGNOSIS — R079 Chest pain, unspecified: Secondary | ICD-10-CM | POA: Diagnosis not present

## 2013-12-31 DIAGNOSIS — I2510 Atherosclerotic heart disease of native coronary artery without angina pectoris: Secondary | ICD-10-CM | POA: Diagnosis not present

## 2013-12-31 DIAGNOSIS — N183 Chronic kidney disease, stage 3 unspecified: Secondary | ICD-10-CM

## 2013-12-31 DIAGNOSIS — E118 Type 2 diabetes mellitus with unspecified complications: Secondary | ICD-10-CM

## 2013-12-31 DIAGNOSIS — R0602 Shortness of breath: Secondary | ICD-10-CM

## 2013-12-31 DIAGNOSIS — N189 Chronic kidney disease, unspecified: Secondary | ICD-10-CM

## 2013-12-31 DIAGNOSIS — R0789 Other chest pain: Secondary | ICD-10-CM

## 2013-12-31 DIAGNOSIS — I10 Essential (primary) hypertension: Secondary | ICD-10-CM

## 2013-12-31 NOTE — Progress Notes (Signed)
Patient ID: Tammy [name], female    DOB: 1945/12/08, 68 y.o.   MRN: [id number]  HPI Comments: Tammy [name] is a 68 year old woman with history of coronary artery disease, occluded mid RCA, PAD, hypertension, back surgery January 2014 with rods placed at that time, remote smoking history stopped 5 years ago,  obesity, deconditioned secondary to chronic back pain who presents for routine followup  PAD with angioplasty of the legs bilaterally,, COPD, chronic kidney disease Lab work indicating she has diabetes type 2, uncontrolled  She reports having prior heart attack catheterizations in 2012 and 2013. Catheterization in 2012 documented occluded mid RCA with left to right collaterals, 40% mid LAD disease, 40% mid circumflex disease, 30% proximal LAD disease  Catheterization in September 2013 showed normal LV function, occluded mid RCA with left to right collaterals, 50% mid and distal LAD disease  Stress test September 2014 showing very small anterior wall defect likely secondary to breast attenuation artifact Echocardiogram May 2014 showing low normal ejection fraction, mild TR, mild pulmonary hypertension  On recent office visit she reported having shortness of breath. Heart rate was 120 beats per minute, sinus tachycardia. Metoprolol succinate dose was increased up to 50 mg twice a day Seen in followup with Tammy [name], chest x-ray ordered that showed no acute abnormality She reports that she does feel somewhat better but still very short of breath with exertion, particularly climbing the stairs in her house She is sedentary, no daily exercise, weight continues to be a problem  Previous lab work from [facility] was reviewed showing hemoglobin A1c 7.6, fasting sugar typically 150 up to 190 Creatinine appears to be running 1.7 She was unaware that she is diabetic  EKG shows normal sinus rhythm with rate 60 beats per minute, no significant ST or T wave changes   Outpatient Encounter  Prescriptions as of 12/31/2013  Medication Sig  . albuterol (PROVENTIL HFA;VENTOLIN HFA) 108 (90 BASE) MCG/ACT inhaler Inhale 2 puffs into the lungs every 6 (six) hours as needed for wheezing or shortness of breath.  [name] aspirin 81 MG tablet Take 81 mg by mouth daily.  . cetirizine (ZYRTEC) 10 MG tablet Take 10 mg by mouth daily.  . cholecalciferol (VITAMIN D) 1000 UNITS tablet Take 2,000 Units by mouth daily.  . clopidogrel (PLAVIX) 75 MG tablet Take 75 mg by mouth daily with breakfast.  . Fexofenadine HCl (ALLEGRA PO) Take by mouth daily.  . fluticasone (FLONASE) 50 MCG/ACT nasal spray Place 1 spray into both nostrils daily.  [name] gabapentin (NEURONTIN) 600 MG tablet Take 600 mg by mouth at bedtime.  . isosorbide mononitrate (IMDUR) 60 MG 24 hr tablet Take 2 tablets (120 mg total) by mouth daily.  [name] losartan-hydrochlorothiazide (HYZAAR) 100-12.5 MG per tablet Take 1 tablet by mouth daily.  . metoprolol succinate (TOPROL-XL) 50 MG 24 hr tablet Take 50 mg by mouth 2 (two) times daily.  . nitroGLYCERIN (NITROSTAT) 0.4 MG SL tablet Place 0.4 mg under the tongue every 5 (five) minutes as needed for chest pain.  . pantoprazole (PROTONIX) 40 MG tablet Take 40 mg by mouth daily.  . ranitidine (ZANTAC) 150 MG capsule Take 150 mg by mouth every morning.  . simvastatin (ZOCOR) 40 MG tablet Take 40 mg by mouth daily.  [name] SYNTHROID 112 MCG tablet Take 112 mcg by mouth daily before breakfast.     Review of Systems  HENT: Negative.   Eyes: Negative.   Respiratory: Positive for shortness of breath.   Cardiovascular: Negative.  Gastrointestinal: Negative.   Endocrine: Negative.   Musculoskeletal: Positive for back pain.  Skin: Negative.   Allergic/Immunologic: Negative.   Neurological: Negative.   Hematological: Negative.   Psychiatric/Behavioral: Negative.   All other systems reviewed and are negative.   BP 114/70  Ht 5\' 6"  (1.676 m)  Wt 221 lb (100.245 kg)  BMI 35.69 kg/m2  Physical Exam   Nursing note and vitals reviewed. Constitutional: She is oriented to person, place, and time. She appears well-developed and well-nourished.  Obese  HENT:  Head: Normocephalic.  Nose: Nose normal.  Mouth/Throat: Oropharynx is clear and moist.  Eyes: Conjunctivae are normal. Pupils are equal, round, and reactive to light.  Neck: Normal range of motion. Neck supple. No JVD present.  Cardiovascular: Normal rate, regular rhythm, S1 normal, S2 normal, normal heart sounds and intact distal pulses.  Exam reveals no gallop and no friction rub.   No murmur heard. Pulmonary/Chest: Effort normal and breath sounds normal. No respiratory distress. She has no wheezes. She has no rales. She exhibits no tenderness.  Abdominal: Soft. Bowel sounds are normal. She exhibits no distension. There is no tenderness.  Musculoskeletal: Normal range of motion. She exhibits no edema and no tenderness.  Lymphadenopathy:    She has no cervical adenopathy.  Neurological: She is alert and oriented to person, place, and time. Coordination normal.  Skin: Skin is warm and dry. No rash noted. No erythema.  Psychiatric: She has a normal mood and affect. Her behavior is normal. Judgment and thought content normal.    Assessment and Plan

## 2013-12-31 NOTE — Assessment & Plan Note (Signed)
Goal LDL less than 70. Currently taking simvastatin

## 2013-12-31 NOTE — Telephone Encounter (Signed)
ddimer called in and is elevated at 3.44.  Dr. [name] aware.  With renal issues will arrange study in am.

## 2013-12-31 NOTE — Assessment & Plan Note (Signed)
I'm concerned that much of her shortness of breath could be coming from obesity and her deconditioned state. Echocardiogram and stress test pending

## 2013-12-31 NOTE — Assessment & Plan Note (Signed)
Blood pressure is well controlled on today's visit. No changes made to the medications. 

## 2013-12-31 NOTE — Assessment & Plan Note (Signed)
We have encouraged continued exercise, careful diet management in an effort to lose weight. 

## 2013-12-31 NOTE — Assessment & Plan Note (Signed)
Heart rate improved on metoprolol succinate 50 mg twice a day

## 2013-12-31 NOTE — Assessment & Plan Note (Addendum)
Etiology is unclear. Echocardiogram previously ordered is pending. This will evaluate  ejection fraction, right heart pressures. We will also order a pharmacologic Myoview. She's unable to treadmill. Recent normal chest x-ray. D-dimer will be sent to rule out PE  Addendum:  D-dimer came back 3.34 CT scan with contrast of the chest was ordered. We received a phone call from radiology confirming right lower lobe PE She was started on eliquis 10 mg twice a day for one week then instructed to decrease the dose down to 5 mg twice a day We'll arrange followup in clinic.

## 2013-12-31 NOTE — Assessment & Plan Note (Signed)
Baseline creatinine 1.7. We'll try to avoid CT scan of the chest for shortness of breath and we'll try to avoid cardiac catheterization if possible

## 2013-12-31 NOTE — Patient Instructions (Addendum)
Your next appointment will be scheduled in our new office located at :  [place]  [address], [place], [place] [address]  For your shortness of breath, We will schedule a stress test, myoview  We wlll try to move the timing of the echocardiogram upwards  We will call you with the results  Please call us if you have new issues that need to be addressed before your next appt.  Your physician wants you to follow-up in: 1 month   [place] caregiver has ordered a Stress Test with nuclear imaging. The purpose of this test is to evaluate the blood supply to your heart muscle. This procedure is referred to as a "Non-Invasive Stress Test." This is because other than having an IV started in your vein, nothing is inserted or "invades" your body. Cardiac stress tests are done to find areas of poor blood flow to the heart by determining the extent of coronary artery disease (CAD). Some patients exercise on a treadmill, which naturally increases the blood flow to your heart, while others who are  unable to walk on a treadmill due to physical limitations have a pharmacologic/chemical stress agent called Lexiscan . This medicine will mimic walking on a treadmill by temporarily increasing your coronary blood flow.   Please note: these test may take anywhere between 2-4 hours to complete  PLEASE REPORT TO [facility] AT THE FIRST DESK WILL DIRECT YOU WHERE TO GO  Date of Procedure:____Thursday, October 29________  Arrival Time for Procedure:____9:45am______________  Instructions regarding medication:   __x__:  Hold other medications as follows:____HOLD METOPROLOL & ISOSORBIDE THE NIGHT BEFORE AND MORNING OF YOUR TEST________  PLEASE NOTIFY THE OFFICE AT LEAST 24 HOURS IN ADVANCE IF YOU ARE UNABLE TO KEEP YOUR APPOINTMENT.  [phone] AND  PLEASE NOTIFY NUCLEAR MEDICINE AT [facility] AT LEAST 24 HOURS IN ADVANCE IF YOU ARE UNABLE  TO KEEP YOUR APPOINTMENT. [phone]  How to prepare for your Myoview test:  1. Do not eat or drink after midnight 2. No caffeine for 24 hours prior to test 3. No smoking 24 hours prior to test. 4. Your medication may be taken with water.  If your doctor stopped a medication because of this test, do not take that medication. 5. Ladies, please do not wear dresses.  Skirts or pants are appropriate. Please wear a short sleeve shirt. 6. No perfume, cologne or lotion. 7. Wear comfortable walking shoes. No heels!

## 2014-01-01 ENCOUNTER — Other Ambulatory Visit: Payer: Self-pay

## 2014-01-01 ENCOUNTER — Ambulatory Visit: Payer: Self-pay | Admitting: Cardiovascular Disease

## 2014-01-01 ENCOUNTER — Telehealth: Payer: Self-pay

## 2014-01-01 DIAGNOSIS — J439 Emphysema, unspecified: Secondary | ICD-10-CM | POA: Diagnosis not present

## 2014-01-01 DIAGNOSIS — R0602 Shortness of breath: Secondary | ICD-10-CM

## 2014-01-01 DIAGNOSIS — R7989 Other specified abnormal findings of blood chemistry: Secondary | ICD-10-CM

## 2014-01-01 DIAGNOSIS — R918 Other nonspecific abnormal finding of lung field: Secondary | ICD-10-CM | POA: Diagnosis not present

## 2014-01-01 DIAGNOSIS — I2699 Other pulmonary embolism without acute cor pulmonale: Secondary | ICD-10-CM | POA: Diagnosis not present

## 2014-01-01 DIAGNOSIS — I700 Atherosclerosis of aorta: Secondary | ICD-10-CM | POA: Diagnosis not present

## 2014-01-01 LAB — CBC WITH DIFFERENTIAL
Basophils Absolute: 0.1 10*3/uL (ref 0.0–0.2)
Basos: 1 %
Eos: 4 %
Eosinophils Absolute: 0.4 10*3/uL (ref 0.0–0.4)
HCT: 39.4 % (ref 34.0–46.6)
Hemoglobin: 13.3 g/dL (ref 11.1–15.9)
Immature Grans (Abs): 0.2 10*3/uL — ABNORMAL HIGH (ref 0.0–0.1)
Immature Granulocytes: 2 %
Lymphocytes Absolute: 2.3 10*3/uL (ref 0.7–3.1)
Lymphs: 25 %
MCH: 29.4 pg (ref 26.6–33.0)
MCHC: 33.8 g/dL (ref 31.5–35.7)
MCV: 87 fL (ref 79–97)
Monocytes Absolute: 1.1 10*3/uL — ABNORMAL HIGH (ref 0.1–0.9)
Monocytes: 12 %
Neutrophils Absolute: 5.1 10*3/uL (ref 1.4–7.0)
Neutrophils Relative %: 56 %
Platelets: 349 10*3/uL (ref 150–379)
RBC: 4.52 x10E6/uL (ref 3.77–5.28)
RDW: 14.2 % (ref 12.3–15.4)
WBC: 9.1 10*3/uL (ref 3.4–10.8)

## 2014-01-01 LAB — BASIC METABOLIC PANEL
BUN/Creatinine Ratio: 17 (ref 11–26)
BUN: 26 mg/dL (ref 8–27)
CO2: 23 mmol/L (ref 18–29)
Calcium: 10 mg/dL (ref 8.7–10.3)
Creatinine, Ser: 1.49 mg/dL — ABNORMAL HIGH (ref 0.57–1.00)
GFR calc Af Amer: 41 mL/min/{1.73_m2} — ABNORMAL LOW (ref >59)
GFR calc non Af Amer: 35 mL/min/{1.73_m2} — ABNORMAL LOW (ref >59)
Glucose: 166 mg/dL — ABNORMAL HIGH (ref 65–99)

## 2014-01-01 LAB — LIPID PANEL
Chol/HDL Ratio: 4.2 ratio units (ref 0.0–4.4)
Cholesterol, Total: 201 mg/dL — ABNORMAL HIGH (ref 100–199)
HDL: 48 mg/dL (ref >39)
LDL Calculated: 115 mg/dL — ABNORMAL HIGH (ref 0–99)
Triglycerides: 189 mg/dL — ABNORMAL HIGH (ref 0–149)
VLDL Cholesterol Cal: 38 mg/dL (ref 5–40)

## 2014-01-01 LAB — D-DIMER, QUANTITATIVE: D-Dimer: 3.44 mg/L FEU — ABNORMAL HIGH (ref 0.00–0.49)

## 2014-01-01 MED ORDER — APIXABAN 5 MG PO TABS: 5.0000 mg | ORAL_TABLET | Freq: Two times a day (BID) | ORAL | Status: DC

## 2014-01-01 NOTE — Telephone Encounter (Signed)
Spoke w/ pt.  Advised her that Dr. [name] would like for her to come in for eval of lower extremities.  She is agreeable and will come in for this tomorrow at 4:00.

## 2014-01-01 NOTE — Telephone Encounter (Signed)
Spoke w/ pt.  Reviewed results of CTA w/ her. Advised her to take Eliquis 10mg  BID x 1 week, then take 5mg  BID.  Advised her that I am cancelling her lexiscan on 01/08/14. She verbalizes understanding, is appreciative and and will call back w/ any questions or concerns.

## 2014-01-01 NOTE — Telephone Encounter (Signed)
Pt's husband picked up orders and samples while pt waited in car.  Advised him to have pt take 1 Eliquis as soon as he got back to car.  He verbalizes understanding.

## 2014-01-01 NOTE — Telephone Encounter (Signed)
Pt's D-dimer came back elevated at 3.44. Dr. [name] notified.  Advised that pt needs STAT chest CTA, PE protocol. Advised pt of results and gave her instructions to come to the office & p/u orders to take to [facility] ASAP. Per Dr. [name], pt needs to be on Eliquis 5mg  BID. Provided pt w/ samples and will have her take a tablet when she arrives.

## 2014-01-02 ENCOUNTER — Encounter (INDEPENDENT_AMBULATORY_CARE_PROVIDER_SITE_OTHER): Payer: Medicare Other

## 2014-01-02 ENCOUNTER — Telehealth: Payer: Self-pay | Admitting: Cardiovascular Disease

## 2014-01-02 DIAGNOSIS — R0602 Shortness of breath: Secondary | ICD-10-CM

## 2014-01-02 DIAGNOSIS — R7989 Other specified abnormal findings of blood chemistry: Secondary | ICD-10-CM

## 2014-01-02 NOTE — Telephone Encounter (Signed)
The patient was in the office today for a lower extremity ultrasound to assess for DVT. This was negative, but she was positive for a PE earlier this week. She was started on Eliquis 10 mg BID x 1 week per PE protocol. She has had 3 doses. This morning she started having nosebleeds that trickle and she has been passing clots in her nose. She denies blood in her urine or stool. I have discussed this with Dr. [name] and he has recommended that she decrease the dose of Eliquis to 5 mg BID and use nasal saline spray. She should avoid blowing her nose. The patient has been made aware of Dr. Donivan Scull recommendations. I have also advised if her nose bleeds continue, that she call the office number over the weekend to be directed to the physician on call. She verbalizes understanding.

## 2014-01-06 ENCOUNTER — Other Ambulatory Visit: Payer: Self-pay

## 2014-01-06 MED ORDER — APIXABAN 5 MG PO TABS: 5.0000 mg | ORAL_TABLET | Freq: Two times a day (BID) | ORAL | Status: DC

## 2014-01-06 MED ORDER — EZETIMIBE 10 MG PO TABS: 10.0000 mg | ORAL_TABLET | Freq: Every day | ORAL | Status: DC

## 2014-01-06 NOTE — Telephone Encounter (Signed)
Eliquis 5 mg tablets is approved through 02/09/2014. Notified patient Eliquis approved as well.

## 2014-01-13 ENCOUNTER — Other Ambulatory Visit: Payer: Self-pay

## 2014-01-13 ENCOUNTER — Other Ambulatory Visit (INDEPENDENT_AMBULATORY_CARE_PROVIDER_SITE_OTHER): Payer: Medicare Other

## 2014-01-13 DIAGNOSIS — I5032 Chronic diastolic (congestive) heart failure: Secondary | ICD-10-CM

## 2014-01-13 DIAGNOSIS — R0602 Shortness of breath: Secondary | ICD-10-CM | POA: Diagnosis not present

## 2014-01-15 ENCOUNTER — Ambulatory Visit (INDEPENDENT_AMBULATORY_CARE_PROVIDER_SITE_OTHER): Payer: Medicare Other

## 2014-01-15 ENCOUNTER — Ambulatory Visit (INDEPENDENT_AMBULATORY_CARE_PROVIDER_SITE_OTHER): Payer: Medicare Other | Admitting: Podiatry

## 2014-01-15 ENCOUNTER — Encounter: Payer: Self-pay | Admitting: Podiatry

## 2014-01-15 VITALS — BP 104/58 | HR 65 | Resp 16 | Ht 66.0 in | Wt 216.0 lb

## 2014-01-15 DIAGNOSIS — M779 Enthesopathy, unspecified: Secondary | ICD-10-CM

## 2014-01-15 DIAGNOSIS — M10072 Idiopathic gout, left ankle and foot: Secondary | ICD-10-CM

## 2014-01-15 DIAGNOSIS — I2510 Atherosclerotic heart disease of native coronary artery without angina pectoris: Secondary | ICD-10-CM | POA: Diagnosis not present

## 2014-01-15 DIAGNOSIS — M1000 Idiopathic gout, unspecified site: Secondary | ICD-10-CM | POA: Diagnosis not present

## 2014-01-15 DIAGNOSIS — L03032 Cellulitis of left toe: Secondary | ICD-10-CM

## 2014-01-15 NOTE — Patient Instructions (Signed)

## 2014-01-15 NOTE — Progress Notes (Addendum)
   Subjective:    Patient ID: Tammy [name], female    DOB: 07-13-1945, 67 y.o.   MRN: [id number]  HPI Comments: Tammy [name], 68 year old female, presents the office today with complaints of left foot pain particularly over the second toe. She states that yesterday she will cup with increased pain, swelling, erythema to the area been unable to put pressure over the area. She states that when she would do better and before she did not have any problems and this came on suddenly. She denies any recent injury or trauma to the area. She also states that the same happened approximately 2 months ago over Labor Day weekend and she went to urgent care and gave her pain medication and a walking shoe. She states of that time resolved an approximate 4 days on its own. She denies any systemic complaints such as fevers, chills, nausea, vomiting. No recent changes. No other complaints at this time.  Patient recently has started eliquis for recent pulmonary embolism.  Patients PCP- Dr. Cletis [place] [phone]   Foot Pain      Review of Systems  Constitutional: Positive for appetite change and unexpected weight change.  HENT: Positive for sinus pressure and sneezing.        Ringing in ears  Respiratory:       Difficulty breathing  Musculoskeletal: Positive for back pain.       Joint pain Difficulty walking  Hematological: Bruises/bleeds easily.  All other systems reviewed and are negative.      Objective:   Physical Exam Objective: AAO x3, NAD DP/PT pulses palpable bilaterally, CRT less than 3 seconds Protective sensation intact with Simms Weinstein monofilament, vibratory sensation intact, Achilles tendon reflex intact There is significant pain overlying the left second MTPJ and along the digit just proximal to the joint. There is erythema and edema overlying the second MTPJ. No ascending cellulitis.There are no open lesions. Pain with range of motion of the second MPJ. Bilateral bunion deformities  present. MMT 5/5, ROM WNL except for left 2nd MTPJ.  No calf pain with compression, swelling, warmth, erythema.  No open lesions.       Assessment & Plan:  68 year old female with left second MTPJ/digit pain, erythema likely gout, however cannot exclude infection/injury -X-rays were obtained and reviewed with the patient. -Treatment options discussed including alternatives, risks, competitions. -At this time it does appear that this likely is gout given the presentation and the history. It does not appear to be infectious in origin. I discussed with the patient to monitor the area closely and if there is any increasing redness, streaking or any systemic complaints to call the office or go to the emergency room. -Will obtain CBC, uric acid, ESR, CRP. -We'll hold off on starting medications until the blood work is obtained. -Continue with surgical shoe. -Follow-up in 1 week or sooner if any problems are to arise or any changes symptoms. In the meantime call the office with any questions, concerns. We'll call patient once we have received the blood work results.

## 2014-01-16 ENCOUNTER — Ambulatory Visit: Payer: Medicare Other | Admitting: Cardiovascular Disease

## 2014-01-16 ENCOUNTER — Telehealth: Payer: Self-pay | Admitting: Podiatry

## 2014-01-16 ENCOUNTER — Other Ambulatory Visit: Payer: Self-pay | Admitting: Podiatry

## 2014-01-16 LAB — CBC WITH DIFFERENTIAL/PLATELET
Basophils Absolute: 0.1 10*3/uL (ref 0.0–0.2)
Basos: 1 %
Eos: 2 %
Eosinophils Absolute: 0.3 10*3/uL (ref 0.0–0.4)
HCT: 38.6 % (ref 34.0–46.6)
Hemoglobin: 12.7 g/dL (ref 11.1–15.9)
Immature Grans (Abs): 0 10*3/uL (ref 0.0–0.1)
Immature Granulocytes: 0 %
Lymphocytes Absolute: 2.2 10*3/uL (ref 0.7–3.1)
Lymphs: 18 %
MCH: 29.8 pg (ref 26.6–33.0)
MCHC: 32.9 g/dL (ref 31.5–35.7)
MCV: 91 fL (ref 79–97)
Monocytes Absolute: 1.3 10*3/uL — ABNORMAL HIGH (ref 0.1–0.9)
Monocytes: 11 %
Neutrophils Absolute: 7.9 10*3/uL — ABNORMAL HIGH (ref 1.4–7.0)
Neutrophils Relative %: 68 %
RBC: 4.26 x10E6/uL (ref 3.77–5.28)
RDW: 14.3 % (ref 12.3–15.4)
WBC: 11.7 10*3/uL — ABNORMAL HIGH (ref 3.4–10.8)

## 2014-01-16 LAB — SEDIMENTATION RATE: Sed Rate: 4 mm/hr (ref 0–40)

## 2014-01-16 LAB — C-REACTIVE PROTEIN: CRP: 38.3 mg/L — ABNORMAL HIGH (ref 0.0–4.9)

## 2014-01-16 LAB — URIC ACID: Uric Acid: 8 mg/dL — ABNORMAL HIGH (ref 2.5–7.1)

## 2014-01-16 MED ORDER — CLINDAMYCIN HCL 300 MG PO CAPS: 300.0000 mg | ORAL_CAPSULE | Freq: Three times a day (TID) | ORAL | Status: DC

## 2014-01-16 NOTE — Telephone Encounter (Signed)
Call patient to discuss lab results. Her white blood cell count, uric acid, CRP was elevated. I discussed this with her primary care physician. At this time he stated to start antibiotic and can start a Medrol Dosepak. I call the patient to discuss this.  At this time prescribed clindamycin 300 mg 3 times a day.we'll hold off on steroid at this time and if there is no improvement will add. I discussed with her to monitor for any clinical signs or symptoms of worsening infection and directed to call the office immediately or go directly to the emergency room if any are to occur. Currently she denies any systemic complaints such as fevers, chills, nausea, vomiting. She does state that her foot is warm and painful. There was warmth to the foot yesterday as well. Discussed with her that there is any red streaking, increased pain, swelling to go to the emergency room. She can follow-up with me as directed on Thursday but also told her if she wants to come in on Tuesday for evaluation she can.  Call with any further questions, concerns, change in symptoms.

## 2014-01-16 NOTE — Addendum Note (Signed)
Addended by: Celesta Gentile R on: 01/16/2014 01:18 PM   Modules accepted: Orders

## 2014-01-22 ENCOUNTER — Ambulatory Visit (INDEPENDENT_AMBULATORY_CARE_PROVIDER_SITE_OTHER): Payer: Medicare Other | Admitting: Podiatry

## 2014-01-22 VITALS — BP 109/66 | HR 99 | Temp 97.5°F | Resp 16

## 2014-01-22 DIAGNOSIS — M10072 Idiopathic gout, left ankle and foot: Secondary | ICD-10-CM

## 2014-01-22 DIAGNOSIS — I2510 Atherosclerotic heart disease of native coronary artery without angina pectoris: Secondary | ICD-10-CM

## 2014-01-22 DIAGNOSIS — L03032 Cellulitis of left toe: Secondary | ICD-10-CM | POA: Diagnosis not present

## 2014-01-22 NOTE — Patient Instructions (Signed)
Monitor for any signs/symptoms of infection. Call the office immediately if any occur or go directly to the emergency room. Call with any questions/concerns.    Gout Gout is an inflammatory arthritis caused by a buildup of uric acid crystals in the joints. Uric acid is a chemical that is normally present in the blood. When the level of uric acid in the blood is too high it can form crystals that deposit in your joints and tissues. This causes joint redness, soreness, and swelling (inflammation). Repeat attacks are common. Over time, uric acid crystals can form into masses (tophi) near a joint, destroying bone and causing disfigurement. Gout is treatable and often preventable. CAUSES  The disease begins with elevated levels of uric acid in the blood. Uric acid is produced by your body when it breaks down a naturally found substance called purines. Certain foods you eat, such as meats and fish, contain high amounts of purines. Causes of an elevated uric acid level include:  Being passed down from parent to child (heredity).  Diseases that cause increased uric acid production (such as obesity, psoriasis, and certain cancers).  Excessive alcohol use.  Diet, especially diets rich in meat and seafood.  Medicines, including certain cancer-fighting medicines (chemotherapy), water pills (diuretics), and aspirin.  Chronic kidney disease. The kidneys are no longer able to remove uric acid well.  Problems with metabolism. Conditions strongly associated with gout include:  Obesity.  High blood pressure.  High cholesterol.  Diabetes. Not everyone with elevated uric acid levels gets gout. It is not understood why some people get gout and others do not. Surgery, joint injury, and eating too much of certain foods are some of the factors that can lead to gout attacks. SYMPTOMS   An attack of gout comes on quickly. It causes intense pain with redness, swelling, and warmth in a joint.  Fever can  occur.  Often, only one joint is involved. Certain joints are more commonly involved:  Base of the big toe.  Knee.  Ankle.  Wrist.  Finger. Without treatment, an attack usually goes away in a few days to weeks. Between attacks, you usually will not have symptoms, which is different from many other forms of arthritis. DIAGNOSIS  Your caregiver will suspect gout based on your symptoms and exam. In some cases, tests may be recommended. The tests may include:  Blood tests.  Urine tests.  X-rays.  Joint fluid exam. This exam requires a needle to remove fluid from the joint (arthrocentesis). Using a microscope, gout is confirmed when uric acid crystals are seen in the joint fluid. TREATMENT  There are two phases to gout treatment: treating the sudden onset (acute) attack and preventing attacks (prophylaxis).  Treatment of an Acute Attack.  Medicines are used. These include anti-inflammatory medicines or steroid medicines.  An injection of steroid medicine into the affected joint is sometimes necessary.  The painful joint is rested. Movement can worsen the arthritis.  You may use warm or cold treatments on painful joints, depending which works best for you.  Treatment to Prevent Attacks.  If you suffer from frequent gout attacks, your caregiver may advise preventive medicine. These medicines are started after the acute attack subsides. These medicines either help your kidneys eliminate uric acid from your body or decrease your uric acid production. You may need to stay on these medicines for a very long time.  The early phase of treatment with preventive medicine can be associated with an increase in acute gout attacks. For  this reason, during the first few months of treatment, your caregiver may also advise you to take medicines usually used for acute gout treatment. Be sure you understand your caregiver's directions. Your caregiver may make several adjustments to your medicine  dose before these medicines are effective.  Discuss dietary treatment with your caregiver or dietitian. Alcohol and drinks high in sugar and fructose and foods such as meat, poultry, and seafood can increase uric acid levels. Your caregiver or dietitian can advise you on drinks and foods that should be limited. HOME CARE INSTRUCTIONS   Do not take aspirin to relieve pain. This raises uric acid levels.  Only take over-the-counter or prescription medicines for pain, discomfort, or fever as directed by your caregiver.  Rest the joint as much as possible. When in bed, keep sheets and blankets off painful areas.  Keep the affected joint raised (elevated).  Apply warm or cold treatments to painful joints. Use of warm or cold treatments depends on which works best for you.  Use crutches if the painful joint is in your leg.  Drink enough fluids to keep your urine clear or pale yellow. This helps your body get rid of uric acid. Limit alcohol, sugary drinks, and fructose drinks.  Follow your dietary instructions. Pay careful attention to the amount of protein you eat. Your daily diet should emphasize fruits, vegetables, whole grains, and fat-free or low-fat milk products. Discuss the use of coffee, vitamin C, and cherries with your caregiver or dietitian. These may be helpful in lowering uric acid levels.  Maintain a healthy body weight. SEEK MEDICAL CARE IF:   You develop diarrhea, vomiting, or any side effects from medicines.  You do not feel better in 24 hours, or you are getting worse. SEEK IMMEDIATE MEDICAL CARE IF:   Your joint becomes suddenly more tender, and you have chills or a fever. MAKE SURE YOU:   Understand these instructions.  Will watch your condition.  Will get help right away if you are not doing well or get worse. Document Released: 02/25/2000 Document Revised: 07/14/2013 Document Reviewed: 10/11/2011 [facility] Patient Information 2015 [place], [place]. This information  is not intended to replace advice given to you by your health care provider. Make sure you discuss any questions you have with your health care provider.

## 2014-01-23 DIAGNOSIS — M10072 Idiopathic gout, left ankle and foot: Secondary | ICD-10-CM | POA: Insufficient documentation

## 2014-01-23 NOTE — Progress Notes (Signed)
Patient ID: Tammy [name], female   DOB: [birth year], 68 y.o.   MRN: [id number]  Subjective: Tammy [name], 68 year old female, presents the office they with her husband for follow-up evaluation of left foot pain, swelling, erythema. Since last appointment she states that her pain, swelling and redness has significant leg decrease although she does continue to have some pain to the area. She's been continuing on the antibiotic. She's been wearing surgical shoe. Denies any systemic complaints as fevers, chills, nausea, vomiting. No acute changes since last appointment. No other complaints at this time.  Objective: AAO x3, NAD DP/PT pulses palpable bilaterally, CRT less than 3 seconds Protective sensation intact with Simms Weinstein monofilament, vibratory sensation intact, Achilles tendon reflex intact Mild edema overlying the left second MTPJ area although decreased from last appointment. There is also similar way decreased erythema overlying this area and there is no ascending cellulitis. There is mild tenderness to palpation over the area and mild tenderness with MTPJ range of motion although significantly improved. There is no open lesions identified. No other areas of tenderness. MMT 5/5, ROM WNL except otherwise mentioned.  No open lesions No calf pain with compression, swelling, warmth, erythema.   Assessment: 68 year old female with resolving left second MTPJ erythema, edema, pain likely infection versus gout or, in addition of both as both her WBC and uric Elevated  Plan: -Treatment options discussed including alternatives, risks, complications. -Recommended to finish course of antibiotics. At this time we will hold off on starting any steroids as she is improving area -Discussed the patient that her uric acid level was elevated and that she mostly does have gout and we discussed diet modifications and other ways to help prevent reoccurrence. Also this is the second time the last couple months that  she has had these symptoms. -Also discussed likely etiologies of increased WBC. -Would recommend follow-up with her PCP. -Continue surgical shoe as needed. Over the next week or 2 discussed the patient she can slowly transition out of the shoe back into a regular sneaker as tolerated. -Follow-up in 2 weeks. In the meantime, call the office with any questions, concerns, changes symptoms. Monitoring clinical signs or symptoms of worsening infection and directed to call the office immediately if any are to occur or go to the emergency room.

## 2014-01-27 DIAGNOSIS — I2782 Chronic pulmonary embolism: Secondary | ICD-10-CM | POA: Diagnosis not present

## 2014-01-27 DIAGNOSIS — K5732 Diverticulitis of large intestine without perforation or abscess without bleeding: Secondary | ICD-10-CM | POA: Diagnosis not present

## 2014-01-27 DIAGNOSIS — M1990 Unspecified osteoarthritis, unspecified site: Secondary | ICD-10-CM | POA: Diagnosis not present

## 2014-01-27 DIAGNOSIS — I739 Peripheral vascular disease, unspecified: Secondary | ICD-10-CM | POA: Diagnosis not present

## 2014-01-30 ENCOUNTER — Ambulatory Visit (INDEPENDENT_AMBULATORY_CARE_PROVIDER_SITE_OTHER): Payer: Medicare Other | Admitting: Cardiovascular Disease

## 2014-01-30 ENCOUNTER — Encounter: Payer: Self-pay | Admitting: Cardiovascular Disease

## 2014-01-30 VITALS — BP 108/62 | HR 57 | Ht 66.0 in | Wt 222.0 lb

## 2014-01-30 DIAGNOSIS — I471 Supraventricular tachycardia: Secondary | ICD-10-CM

## 2014-01-30 DIAGNOSIS — N183 Chronic kidney disease, stage 3 unspecified: Secondary | ICD-10-CM

## 2014-01-30 DIAGNOSIS — R000 Tachycardia, unspecified: Secondary | ICD-10-CM

## 2014-01-30 DIAGNOSIS — M10072 Idiopathic gout, left ankle and foot: Secondary | ICD-10-CM | POA: Diagnosis not present

## 2014-01-30 DIAGNOSIS — I10 Essential (primary) hypertension: Secondary | ICD-10-CM

## 2014-01-30 DIAGNOSIS — I2510 Atherosclerotic heart disease of native coronary artery without angina pectoris: Secondary | ICD-10-CM

## 2014-01-30 DIAGNOSIS — N189 Chronic kidney disease, unspecified: Secondary | ICD-10-CM | POA: Diagnosis not present

## 2014-01-30 DIAGNOSIS — E785 Hyperlipidemia, unspecified: Secondary | ICD-10-CM | POA: Diagnosis not present

## 2014-01-30 DIAGNOSIS — I2699 Other pulmonary embolism without acute cor pulmonale: Secondary | ICD-10-CM

## 2014-01-30 NOTE — Assessment & Plan Note (Signed)
Likely secondary to DVT from inactivity, sitting at her computer long periods of time in the setting of obesity. Has been on anticoagulation 6 weeks with improvement of her symptoms.

## 2014-01-30 NOTE — Assessment & Plan Note (Signed)
Encouraged her to stay on her simvastatin and zetia Goal LDL less than 70

## 2014-01-30 NOTE — Assessment & Plan Note (Signed)
Stress the importance of aggressive diabetes control, weight loss, diet restrictions

## 2014-01-30 NOTE — Assessment & Plan Note (Signed)
Currently with no symptoms of angina. No further workup at this time. Continue current medication regimen. 

## 2014-01-30 NOTE — Assessment & Plan Note (Signed)
Blood pressure borderline low. We will decrease her metoprolol dose as above

## 2014-01-30 NOTE — Patient Instructions (Signed)
You are doing well.  Please take metoprolol 1 1/2 pills daily (down from 1 whole pill twice a day) OK to titrate back down to one a day if heart rate continues to run low  Please hold the plavix If you continue to have nose bleeds, Call the office We would stop the aspirin  Please call us if you have new issues that need to be addressed before your next appt.  Your physician wants you to follow-up in: 6 months.  You will receive a reminder letter in the mail two months in advance. If you don't receive a letter, please call our office to schedule the follow-up appointment.

## 2014-01-30 NOTE — Assessment & Plan Note (Signed)
We have encouraged continued exercise, careful diet management in an effort to lose weight. 

## 2014-01-30 NOTE — Assessment & Plan Note (Addendum)
Etiology of her foot pain is unclear. Tips of her second toe hurts, plantar surface of her foot hurts. She was given antibiotics by acute care. Slow improvement of her symptoms

## 2014-01-30 NOTE — Assessment & Plan Note (Signed)
Previously noted sinus tachycardia has improved. We'll decrease her metoprolol succinate back to 75 mg daily. If heart rate continues to run low, we'll decrease back to 50 g daily. Previously elevated heart rate likely secondary to pulmonary embolism and shortness of breath/hypoxia

## 2014-01-30 NOTE — Progress Notes (Signed)
Patient ID: Tammy [name], female    DOB: 03-01-1946, 68 y.o.   MRN: [id number]  HPI Comments: [name] is a 68 year old woman with history of coronary artery disease, occluded mid RCA, PAD, hypertension, back surgery January 2014 with rods placed at that time, remote smoking history stopped 5 years ago,  obesity, deconditioned secondary to chronic back pain who presents for routine followup of her coronary artery disease   PAD with angioplasty of the legs bilaterally,, COPD, chronic kidney disease Lab work indicating she has diabetes type 2, uncontrolled Recent diagnosis of PE in October 2015, started on anticoagulation, eliquis  In follow-up today, she reports that she is doing well. Shortness of breath and tachycardia has improved in the past 6 weeks on anticoagulation She is taking eliquis 5 mg twice a day, also taking aspirin and Plavix for history of TIA. She has had some recent nosebleeds. She is taking losartan HCTZ one half pill daily Recent hemoglobin A1c 7.6  On further discussion today, she feels she may have developed a DVT from sitting long periods of time. 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 Catheterization in 2012 documented occluded mid RCA with left to right collaterals, 40% mid LAD disease, 40% mid circumflex disease, 30% proximal LAD disease  Catheterization in September 2013 showed normal LV function, occluded mid RCA with left to right collaterals, 50% mid and distal LAD disease  Stress test September 2014 showing very small anterior wall defect likely secondary to breast attenuation artifact Echocardiogram May 2014  showing low normal ejection fraction, mild TR, mild pulmonary hypertension       Outpatient Encounter Prescriptions as of 01/30/2014  Medication Sig  . albuterol (PROVENTIL HFA;VENTOLIN HFA) 108 (90 BASE) MCG/ACT inhaler Inhale 2 puffs into the lungs every 6 (six) hours as needed for wheezing or shortness of breath.  [name] apixaban (ELIQUIS) 5 MG TABS tablet Take 1 tablet (5 mg total) by mouth 2 (two) times daily.  [name] aspirin 81 MG tablet Take 81 mg by mouth daily.  . cholecalciferol (VITAMIN D) 1000 UNITS tablet Take 2,000 Units by mouth daily.  . clindamycin (CLEOCIN) 300 MG capsule Take 1 capsule (300 mg total) by mouth 3 (three) times daily.  . clopidogrel (PLAVIX) 75 MG tablet Take 75 mg by mouth daily with breakfast.  . ezetimibe (ZETIA) 10 MG tablet Take 1 tablet (10 mg total) by mouth daily.  [name] Fexofenadine HCl (ALLEGRA PO) Take by mouth daily.  . fluticasone (FLONASE) 50 MCG/ACT nasal spray Place 1 spray into both nostrils daily.  [name] gabapentin (NEURONTIN) 600 MG tablet Take 600 mg by mouth at bedtime.  . isosorbide mononitrate (IMDUR) 60 MG 24 hr tablet Take 2 tablets (120 mg total) by mouth daily.  [name] losartan-hydrochlorothiazide (HYZAAR) 100-12.5 MG per tablet Take 1 tablet by mouth daily.  . metoprolol succinate (TOPROL-XL) 50 MG 24 hr tablet Take 50 mg by mouth 2 (two) times daily.  . nitroGLYCERIN (NITROSTAT) 0.4 MG SL tablet Place 0.4 mg under the tongue every 5 (five) minutes as needed for chest pain.  . pantoprazole (PROTONIX) 40 MG tablet Take 40 mg by mouth daily.  [name]  simvastatin (ZOCOR) 40 MG tablet Take 40 mg by mouth daily.  [name] SYNTHROID 112 MCG tablet Take 112 mcg by mouth daily before breakfast.    Social history  reports that she has quit smoking. She does not have any smokeless tobacco history on file. She reports that she does not drink alcohol or use illicit drugs.  Review of Systems  Constitutional: Negative.   Respiratory: Negative.   Cardiovascular: Negative.    Musculoskeletal: Positive for back pain.  Neurological: Negative.   Hematological: Negative.   Psychiatric/Behavioral: Negative.   All other systems reviewed and are negative.   BP 108/62 mmHg  Pulse 57  Ht 5\' 6"  (1.676 m)  Wt 222 lb (100.699 kg)  BMI 35.85 kg/m2  Physical Exam  Constitutional: She is oriented to person, place, and time. She appears well-developed and well-nourished.  HENT:  Head: Normocephalic.  Nose: Nose normal.  Mouth/Throat: Oropharynx is clear and moist.  Eyes: Conjunctivae are normal. Pupils are equal, round, and reactive to light.  Neck: Normal range of motion. Neck supple. No JVD present.  Cardiovascular: Normal rate, regular rhythm, S1 normal, S2 normal, normal heart sounds and intact distal pulses.  Exam reveals no gallop and no friction rub.   No murmur heard. Pulmonary/Chest: Effort normal and breath sounds normal. No respiratory distress. She has no wheezes. She has no rales. She exhibits no tenderness.  Abdominal: Soft. Bowel sounds are normal. She exhibits no distension. There is no tenderness.  Musculoskeletal: Normal range of motion. She exhibits no edema or tenderness.  Lymphadenopathy:    She has no cervical adenopathy.  Neurological: She is alert and oriented to person, place, and time. Coordination normal.  Skin: Skin is warm and dry. No rash noted. No erythema.  Psychiatric: She has a normal mood and affect. Her behavior is normal. Judgment and thought content normal.    Assessment and Plan  Nursing note and vitals reviewed.

## 2014-02-10 ENCOUNTER — Ambulatory Visit: Payer: PRIVATE HEALTH INSURANCE | Admitting: Podiatry

## 2014-02-20 DIAGNOSIS — K229 Disease of esophagus, unspecified: Secondary | ICD-10-CM | POA: Diagnosis not present

## 2014-02-20 DIAGNOSIS — E890 Postprocedural hypothyroidism: Secondary | ICD-10-CM | POA: Diagnosis not present

## 2014-02-20 DIAGNOSIS — Z809 Family history of malignant neoplasm, unspecified: Secondary | ICD-10-CM | POA: Diagnosis not present

## 2014-02-20 DIAGNOSIS — Z6837 Body mass index (BMI) 37.0-37.9, adult: Secondary | ICD-10-CM | POA: Diagnosis not present

## 2014-02-20 DIAGNOSIS — E063 Autoimmune thyroiditis: Secondary | ICD-10-CM | POA: Diagnosis not present

## 2014-02-20 DIAGNOSIS — E039 Hypothyroidism, unspecified: Secondary | ICD-10-CM | POA: Diagnosis not present

## 2014-02-20 DIAGNOSIS — K915 Postcholecystectomy syndrome: Secondary | ICD-10-CM | POA: Diagnosis not present

## 2014-02-27 DIAGNOSIS — K229 Disease of esophagus, unspecified: Secondary | ICD-10-CM | POA: Diagnosis not present

## 2014-02-27 DIAGNOSIS — Z809 Family history of malignant neoplasm, unspecified: Secondary | ICD-10-CM | POA: Diagnosis not present

## 2014-02-27 DIAGNOSIS — E890 Postprocedural hypothyroidism: Secondary | ICD-10-CM | POA: Diagnosis not present

## 2014-02-27 DIAGNOSIS — E039 Hypothyroidism, unspecified: Secondary | ICD-10-CM | POA: Diagnosis not present

## 2014-02-27 DIAGNOSIS — E063 Autoimmune thyroiditis: Secondary | ICD-10-CM | POA: Diagnosis not present

## 2014-02-27 DIAGNOSIS — K915 Postcholecystectomy syndrome: Secondary | ICD-10-CM | POA: Diagnosis not present

## 2014-05-12 ENCOUNTER — Encounter: Payer: Self-pay | Admitting: Cardiovascular Disease

## 2014-05-12 ENCOUNTER — Ambulatory Visit (INDEPENDENT_AMBULATORY_CARE_PROVIDER_SITE_OTHER): Payer: Medicare Other | Admitting: Cardiovascular Disease

## 2014-05-12 VITALS — BP 120/72 | HR 64 | Ht 66.0 in | Wt 220.5 lb

## 2014-05-12 DIAGNOSIS — N189 Chronic kidney disease, unspecified: Secondary | ICD-10-CM | POA: Diagnosis not present

## 2014-05-12 DIAGNOSIS — I10 Essential (primary) hypertension: Secondary | ICD-10-CM

## 2014-05-12 DIAGNOSIS — R079 Chest pain, unspecified: Secondary | ICD-10-CM

## 2014-05-12 DIAGNOSIS — M549 Dorsalgia, unspecified: Secondary | ICD-10-CM | POA: Diagnosis not present

## 2014-05-12 DIAGNOSIS — E1165 Type 2 diabetes mellitus with hyperglycemia: Secondary | ICD-10-CM

## 2014-05-12 DIAGNOSIS — I2699 Other pulmonary embolism without acute cor pulmonale: Secondary | ICD-10-CM | POA: Diagnosis not present

## 2014-05-12 DIAGNOSIS — N183 Chronic kidney disease, stage 3 unspecified: Secondary | ICD-10-CM

## 2014-05-12 DIAGNOSIS — I739 Peripheral vascular disease, unspecified: Secondary | ICD-10-CM | POA: Diagnosis not present

## 2014-05-12 DIAGNOSIS — E785 Hyperlipidemia, unspecified: Secondary | ICD-10-CM | POA: Diagnosis not present

## 2014-05-12 DIAGNOSIS — E118 Type 2 diabetes mellitus with unspecified complications: Secondary | ICD-10-CM | POA: Diagnosis not present

## 2014-05-12 DIAGNOSIS — R0602 Shortness of breath: Secondary | ICD-10-CM

## 2014-05-12 DIAGNOSIS — IMO0002 Reserved for concepts with insufficient information to code with codable children: Secondary | ICD-10-CM

## 2014-05-12 DIAGNOSIS — I2510 Atherosclerotic heart disease of native coronary artery without angina pectoris: Secondary | ICD-10-CM | POA: Diagnosis not present

## 2014-05-12 MED ORDER — NITROGLYCERIN 0.4 MG SL SUBL: 0.4000 mg | SUBLINGUAL_TABLET | SUBLINGUAL | Status: DC | PRN

## 2014-05-12 NOTE — Assessment & Plan Note (Signed)
We'll arrange for lower extremity Dopplers later this year

## 2014-05-12 NOTE — Assessment & Plan Note (Addendum)
Waxing waning atypical symptoms. Possibly from underlying lung disease. Recent normal echocardiogram. Very deconditioned

## 2014-05-12 NOTE — Assessment & Plan Note (Addendum)
She has follow-up with orthopedics for her back. Sounds like she is having sciatica on the left

## 2014-05-12 NOTE — Patient Instructions (Addendum)
You are doing well. No medication changes were made.  If chest pain persists, Call the office. We would order a stress test  Please call us if you have new issues that need to be addressed before your next appt.  Your physician wants you to follow-up in: 6 months.  You will receive a reminder letter in the mail two months in advance. If you don't receive a letter, please call our office to schedule the follow-up appointment.

## 2014-05-12 NOTE — Assessment & Plan Note (Signed)
Blood pressure is well controlled on today's visit. No changes made to the medications. 

## 2014-05-12 NOTE — Progress Notes (Signed)
Patient ID: Tammy [name], female    DOB: February 19, 1946, 69 y.o.   MRN: [id number]  HPI Comments: Tammy [name] is a 69 year old woman with history of coronary artery disease, occluded mid RCA, PAD, hypertension, back surgery January 2014 with rods placed at that time, remote smoking history stopped 5 years ago,  obesity, deconditioned secondary to chronic back pain who presents for routine followup of her coronary artery disease   PAD with angioplasty of the legs bilaterally, COPD, chronic kidney disease  diabetes type 2, uncontrolled  diagnosis of PE in October 2015, started on anticoagulation, eliquis  She presents today and reports having some chest pain that presented 04/26/2014. She described it as a very sharp pain 2 on the left that lasted a brief period, several minutes. Also with associated left hand contracture possibly of her fourth and fifth fingers. Since then she has had a waxing and waning chest pain, at rest, no association with exertion. She is relatively sedentary at baseline. 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 She takes aspirin and eliquis. Plavix held for nosebleeds Occasional shortness of breath that seems to come and go. Possible improvement with inhalers  no daily exercise, weight continues to be a problem  EKG on today's visit shows normal sinus rhythm with rate 64 bpm, nonspecific T wave abnormality  Other past medical history Previous hemoglobin A1c 7.6  On further discussion today, she feels she may have developed a DVT from sitting long periods of time. 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 Catheterization in 2012 documented occluded mid RCA with left to right collaterals, 40% mid LAD disease, 40% mid circumflex disease, 30% proximal LAD  disease Catheterization in September 2013 showed normal LV function, occluded mid RCA with left to right collaterals, 50% mid and distal LAD disease  Stress test September 2014 showing very small anterior wall defect likely secondary to breast attenuation artifact Echocardiogram May 2014 showing low normal ejection fraction, mild TR, mild pulmonary hypertension      Allergies  Allergen Reactions  . Aleve [Naproxen Sodium]     Hives & itching   . Oxycodone Other (See Comments)    hallucinations  . Ranexa [Ranolazine]     Sick on stomach  . Tramadol Other (See Comments)    nightmares    Outpatient Encounter Prescriptions as of 05/12/2014  Medication Sig  . albuterol (PROVENTIL HFA;VENTOLIN HFA) 108 (90 BASE) MCG/ACT inhaler Inhale 2 puffs into the lungs every 6 (six) hours as needed for wheezing or shortness of breath.  [name] apixaban (ELIQUIS) 5 MG TABS tablet Take 1 tablet (5 mg total) by mouth 2 (two) times daily.  [name] aspirin 81 MG tablet Take 81 mg by mouth daily.  . cholecalciferol (VITAMIN D) 1000 UNITS tablet Take 2,000 Units by mouth daily.  . clindamycin (CLEOCIN) 300 MG capsule Take 1 capsule (300 mg total) by mouth 3 (three) times daily.  [name] ezetimibe (ZETIA) 10 MG tablet Take 1 tablet (10 mg total) by mouth daily.  [name] Fexofenadine HCl (ALLEGRA PO) Take by mouth daily.  . fluticasone (FLONASE) 50 MCG/ACT nasal spray Place 1 spray into both nostrils daily.  [name] gabapentin (NEURONTIN) 600 MG tablet Take 600 mg by mouth at bedtime.  . isosorbide mononitrate (IMDUR) 60 MG 24 hr tablet Take 2 tablets (  120 mg total) by mouth daily.  [name] losartan-hydrochlorothiazide (HYZAAR) 100-12.5 MG per tablet Take 1 tablet by mouth daily.  . metoprolol succinate (TOPROL-XL) 50 MG 24 hr tablet Take 50 mg by mouth 2 (two) times daily.  . nitroGLYCERIN (NITROSTAT) 0.4 MG SL tablet Place 1 tablet (0.4 mg total) under the tongue every 5 (five) minutes as needed for chest pain.  . pantoprazole (PROTONIX) 40  MG tablet Take 40 mg by mouth daily.  . simvastatin (ZOCOR) 40 MG tablet Take 40 mg by mouth daily.  [name] SYNTHROID 112 MCG tablet Take 112 mcg by mouth daily before breakfast.   . [DISCONTINUED] clopidogrel (PLAVIX) 75 MG tablet Take 75 mg by mouth daily with breakfast.  . [DISCONTINUED] nitroGLYCERIN (NITROSTAT) 0.4 MG SL tablet Place 0.4 mg under the tongue every 5 (five) minutes as needed for chest pain.    Past Medical History  Diagnosis Date  . Coronary artery disease     a. 11/2011 Cath: LAD 87m/d, LCX min irregs, RCA 70p, 166m with L->R collats, EF 60%-->Med Rx.  [name] Hypertension   . PVD (peripheral vascular disease)     a. PTA of right leg and left femoral artery with stenosis.  [name] COPD (chronic obstructive pulmonary disease)   . Hyperlipidemia   . CKD (chronic kidney disease), stage III   . Chronic diastolic CHF (congestive heart failure)     a. 07/2012 Echo: Nl EF. mild inferoseptal HK, Gr 2 DD, mild conc LVH, mild PR/TR, mild PAH.  . Stroke   . Palpitations   . Benign neoplasm of colon   . GERD (gastroesophageal reflux disease)   . Hypothyroidism   . History of tobacco abuse     a. Quit 2011.  . PE (pulmonary embolism)     Past Surgical History  Procedure Laterality Date  . Angioplasty / stenting femoral      PVD; angioplasty right leg and left femoral artery with stenosis.   . Trigger finger release    . Thyroidectomy    . Cholecystectomy    . Cardiac catheterization  12/07/2011    Mid LAD 50%, distal LAD 50%, mid RCA 70%, distal RCA 100%   . Cardiac catheterization  01/04/2011    100% occluded mid RCA with good collaterals from distal LAD, normal LVEF.  . Back surgery  03/2012    Social History  reports that she has quit smoking. She does not have any smokeless tobacco history on file. She reports that she does not drink alcohol or use illicit drugs.  Family History family history includes Heart attack in her brother, brother, and brother; Heart disease in her  father; Hypertension in her father.   Review of Systems  Constitutional: Negative.   Respiratory: Positive for shortness of breath.   Cardiovascular: Positive for chest pain.  Musculoskeletal: Positive for back pain.  Neurological: Negative.   Hematological: Negative.   Psychiatric/Behavioral: Negative.   All other systems reviewed and are negative.   BP 120/72 mmHg  Pulse 64  Ht 5\' 6"  (1.676 m)  Wt 220 lb 8 oz (100.018 kg)  BMI 35.61 kg/m2  Physical Exam  Constitutional: She is oriented to person, place, and time. She appears well-developed and well-nourished.  HENT:  Head: Normocephalic.  Nose: Nose normal.  Mouth/Throat: Oropharynx is clear and moist.  Eyes: Conjunctivae are normal. Pupils are equal, round, and reactive to light.  Neck: Normal range of motion. Neck supple. No JVD present.  Cardiovascular: Normal rate, regular rhythm, S1 normal,  S2 normal, normal heart sounds and intact distal pulses.  Exam reveals no gallop and no friction rub.   No murmur heard. Pulmonary/Chest: Effort normal and breath sounds normal. No respiratory distress. She has no wheezes. She has no rales. She exhibits no tenderness.  Abdominal: Soft. Bowel sounds are normal. She exhibits no distension. There is no tenderness.  Musculoskeletal: Normal range of motion. She exhibits no edema or tenderness.  Lymphadenopathy:    She has no cervical adenopathy.  Neurological: She is alert and oriented to person, place, and time. Coordination normal.  Skin: Skin is warm and dry. No rash noted. No erythema.  Psychiatric: She has a normal mood and affect. Her behavior is normal. Judgment and thought content normal.    Assessment and Plan  Nursing note and vitals reviewed.

## 2014-05-12 NOTE — Assessment & Plan Note (Signed)
We have encouraged continued exercise, careful diet management in an effort to lose weight. 

## 2014-05-12 NOTE — Assessment & Plan Note (Signed)
Creatinine 1.7 up to 1.8 likely from long-standing diabetes. We'll try to avoid catheterizations at this time

## 2014-05-12 NOTE — Assessment & Plan Note (Signed)
Recommended strict diet, weight loss

## 2014-05-12 NOTE — Assessment & Plan Note (Signed)
Recent symptoms of chest pain. Atypical in nature though she does have underlying coronary disease. Some improvement in the past 2 weeks. No significant EKG changes. We have recommended if symptoms persist or get worse, that she call our office for stress testing. We will try to avoid catheterization given underlying renal dysfunction though could be done if she was prehydrated

## 2014-05-12 NOTE — Assessment & Plan Note (Signed)
Goal LDL less than 70. Monitored by primary care

## 2014-05-12 NOTE — Assessment & Plan Note (Signed)
She is high risk of recurrent DVT as she sits for long hours at a time. Encouraged her to stay on her anticoagulation

## 2014-05-21 DIAGNOSIS — M5432 Sciatica, left side: Secondary | ICD-10-CM | POA: Diagnosis not present

## 2014-05-24 DIAGNOSIS — M5430 Sciatica, unspecified side: Secondary | ICD-10-CM | POA: Insufficient documentation

## 2014-05-25 DIAGNOSIS — M4727 Other spondylosis with radiculopathy, lumbosacral region: Secondary | ICD-10-CM | POA: Diagnosis not present

## 2014-06-04 ENCOUNTER — Telehealth: Payer: Self-pay | Admitting: *Deleted

## 2014-06-04 DIAGNOSIS — M5127 Other intervertebral disc displacement, lumbosacral region: Secondary | ICD-10-CM | POA: Diagnosis not present

## 2014-06-04 DIAGNOSIS — M4806 Spinal stenosis, lumbar region: Secondary | ICD-10-CM | POA: Diagnosis not present

## 2014-06-04 NOTE — Telephone Encounter (Signed)
Request for surgical clearance:  1. What type of surgery is being performed? Back surgery  2. When is this surgery scheduled? A week from tmrw   3. Are there any medications that need to be held prior to surgery and how long? eliquis a week before the surgery  4. Name of physician performing surgery? Dr Dionisio Paschal   5. What is your office phone and fax number? Phone number: [phone] fax: [phone]  Pt states she may come tmrw and drop off paper work stated to her it will take 48 hours to do and can not promise it will be done tmrw. She is okay and aware of this.

## 2014-06-05 NOTE — Telephone Encounter (Signed)
Paperwork is on [employer].  She would like to pick this up today, if possible, b/c she will need to stop Eliquis today.

## 2014-06-05 NOTE — Telephone Encounter (Signed)
Pt husband asking about below.  Please call when ready.  Pt states she did not take her meds today.

## 2014-06-05 NOTE — Telephone Encounter (Signed)
Dr. [name] signed clearance request for pt to continue aspirin, but hold Eliquis prior to surgery on 06/12/14 for Lt L4 strain. Faxed to [facility] at [phone] and left up front for pt to pick up.

## 2014-06-06 DIAGNOSIS — M48061 Spinal stenosis, lumbar region without neurogenic claudication: Secondary | ICD-10-CM | POA: Insufficient documentation

## 2014-06-06 DIAGNOSIS — IMO0002 Reserved for concepts with insufficient information to code with codable children: Secondary | ICD-10-CM | POA: Insufficient documentation

## 2014-06-10 ENCOUNTER — Telehealth: Payer: Self-pay | Admitting: *Deleted

## 2014-06-10 NOTE — Telephone Encounter (Signed)
Nurse from [facility] calling stating they got the clearance. But on the clearance did not say how long pt is able to be off medication and so forth.  She can take a verbal on the phone, but if we choose to fax something fax number is below.  Pleas call and we can leave message on vm Fax: [phone]

## 2014-06-10 NOTE — Telephone Encounter (Signed)
Okay to stop anticoagulation on the fifth Would restart 48 hours following the procedure unless surgeon would like a later start  Would also remember wearing tight compression hose during the surgery, recovery, and at home while recovering  Would like to prevent further DVT, PE

## 2014-06-10 NOTE — Telephone Encounter (Signed)
How long should pt hold her Eliquis before her back surgery on 06/12/14? [facility] advised her to stop on 3/25, which pt has been doing, but they need this in writing, as well as when to restart. Please advise.  Thank you.

## 2014-06-11 NOTE — Telephone Encounter (Signed)
Janett Billow from [facility] calling about below [phone] Just calling to see if we have decided on the dosage  Fax:[phone]

## 2014-06-11 NOTE — Telephone Encounter (Signed)
Faxed Dr. Donivan Scull recommendation to [facility].

## 2014-06-12 DIAGNOSIS — I10 Essential (primary) hypertension: Secondary | ICD-10-CM | POA: Diagnosis not present

## 2014-06-12 DIAGNOSIS — Z981 Arthrodesis status: Secondary | ICD-10-CM | POA: Diagnosis not present

## 2014-06-12 DIAGNOSIS — E785 Hyperlipidemia, unspecified: Secondary | ICD-10-CM | POA: Diagnosis not present

## 2014-06-12 DIAGNOSIS — M5116 Intervertebral disc disorders with radiculopathy, lumbar region: Secondary | ICD-10-CM | POA: Diagnosis not present

## 2014-06-12 DIAGNOSIS — M4806 Spinal stenosis, lumbar region: Secondary | ICD-10-CM | POA: Diagnosis not present

## 2014-06-12 DIAGNOSIS — M5106 Intervertebral disc disorders with myelopathy, lumbar region: Secondary | ICD-10-CM | POA: Diagnosis not present

## 2014-06-30 NOTE — Op Note (Signed)
PATIENT NAME:  Tammy [name], Tammy [name] MR#:  [id number] DATE OF BIRTH:  07/21/1945  DATE OF PROCEDURE:  11/08/2011  PREOPERATIVE DIAGNOSIS: Right hydronephrosis/UPJ obstruction.   POSTOPERATIVE DIAGNOSIS:  Right hydronephrosis/UPJ obstruction.   PROCEDURE: Cystoscopy with right retrograde pyelogram.   SURGEON:  S. Nora Rooke, MD  INDICATIONS: 69 year old female with a history of UPJ obstruction, which on initial Lasix renal scanning in July 2009 was not felt to represent significant obstruction. She had recently developed some right back pain and a follow-up Lasix renal scan was felt to show significant obstruction. She was subsequently diagnosed with back problems and after treatment her back pain has resolved. She was scheduled for cystoscopy with right retrograde pyelogram and possible stent placement.   DESCRIPTION OF PROCEDURE: The patient was taken to the operating room where a general anesthetic was administered. She was placed in the low lithotomy position and her external genitalia were prepped and draped in usual fashion. The urethra would not accept a 56 French cystoscope sheath with obturator and was dilated with Tammy [name] sounds from 18 to 24 [place]. The cystoscope sheath was then easily passed.  Panendoscopy was performed with 30 and 70-degree lenses. The ureteral orifices were normal appearing and bilateral efflux was noted. The bladder mucosa was closely inspected. No erythema, solid or papillary lesions were identified. A 0.035 guidewire was placed through the cystoscope and into the right distal ureter. A 5 French open-ended ureteral catheter was then placed over the wire. Right retrograde pyelogram was performed. The entire ureter was normal in caliber. There was noted to be dilation of the renal pelvis and calyces consistent with UPJ obstruction. No filling defects or calculi were noted under fluoroscopy. There was no significant efflux of contrast noted. Since she was asymptomatic it was elected not  to place a stent at this time. The bladder was emptied and the cystoscope was removed. She was taken to PAC-U in stable condition. There were no complications. Estimated blood loss was zero.    ____________________________ Tammy Fairly Bernardo Heater, MD scs:bjt D:  11/08/2011 22:20:31 ET          T: 11/09/2011 09:48:04 ET         JOB#: [id number] Tammy Vandekamp C Eartha Vonbehren MD ELECTRONICALLY SIGNED 11/17/2011 12:09

## 2014-07-05 NOTE — Op Note (Signed)
PATIENT NAME:  Tammy [name], BLASE MR#:  [id number] DATE OF BIRTH:  [birth year]  DATE OF PROCEDURE:  07/17/2011  PREOPERATIVE DIAGNOSES:  1. Peripheral arterial disease with claudication, left lower extremity.  2. Status post right lower extremity revascularization for claudication.  3. Hypertension.   POSTOPERATIVE DIAGNOSES:  1. Peripheral arterial disease with claudication, left lower extremity.  2. Status post right lower extremity revascularization for claudication.  3. Hypertension.   PROCEDURES PERFORMED: 1. Ultrasound guidance for vascular access, right femoral artery.  2. Catheter placement into left popliteal artery from right femoral approach.  3. Aortogram and selective left lower extremity angiogram.  4. Percutaneous transluminal angioplasty of long SFA occlusion with 6 mm diameter angioplasty balloon.  5. StarClose closure device, right femoral artery.   SURGEON: [name], M.D.   ANESTHESIA: Local with moderate conscious sedation.   ESTIMATED BLOOD LOSS: Minimal.   INDICATION FOR PROCEDURE: This is a 69 year old female known to me for her peripheral vascular disease. A couple of years ago, she had undergone right lower extremity revascularization with good results that have been durable. She returns with worsening claudication of her left lower extremity and desires intervention for lifestyle limiting symptoms. The risks and benefits were discussed and informed consent was obtained.   DESCRIPTION OF PROCEDURE: The patient was brought to the vascular interventional radiology suite. Her groins were shaved and prepped and a sterile surgical field was created. The right femoral head was localized with fluoroscopy and the right femoral artery was then visualized with ultrasound due to body habitus, accessed under direct ultrasound guidance, and a permanent image was recorded. A 5 French sheath was placed. A pigtail catheter was placed in the aorta at the L1 level and AP aortogram was  performed. This showed normal renal vessels bilaterally with no flow-limiting stenosis in the aortoiliac segments and some mild ectasia of the aorta. I then hooked the aortic bifurcation and advanced to the left femoral head and selective left lower extremity angiogram was then performed. This showed a short stump of the superficial femoral artery that was then occluded over a long segment and reconstituted in the distal superficial femoral artery. She then had a patent popliteal artery and three-vessel runoff distally. The patient was given 3500 units of intravenous heparin for systemic anticoagulation and a 6 French Ansel sheath was placed over a Terumo Advantage wire. The Terumo Advantage wire and Kumpe catheter were used to cross the occlusion without difficulty and confirm intraluminal flow in the popliteal artery. I then replaced the wire. Percutaneous transluminal angioplasty was then performed with a 6 mm diameter angioplasty balloon from Hunter's canal to the common femoral artery encompassing the entire SFA and following angioplasty the SFA was now widely patent with good flow and no stenosis greater than 50% residual with three-vessel runoff maintained distally. At this point, I elected to terminate the procedure. The sheath was pulled back to the ipsilateral external iliac artery and oblique arteriogram was performed. StarClose closure device was deployed in the usual fashion with excellent hemostatic result. The patient tolerated the procedure well and was taken to the recovery room in stable condition. ____________________________ [name], MD jsd:slb D: 07/17/2011 09:38:12 ET T: 07/17/2011 10:29:57 ET JOB#: [id number]  cc: [name], MD, <Dictator> Cletis Athens, MD Mckinley Jewel, MD [name] MD ELECTRONICALLY SIGNED 07/22/2011 7:40

## 2014-07-08 DIAGNOSIS — Z23 Encounter for immunization: Secondary | ICD-10-CM | POA: Diagnosis not present

## 2014-07-08 DIAGNOSIS — E785 Hyperlipidemia, unspecified: Secondary | ICD-10-CM | POA: Diagnosis not present

## 2014-07-08 DIAGNOSIS — M545 Low back pain: Secondary | ICD-10-CM | POA: Diagnosis not present

## 2014-07-08 DIAGNOSIS — E669 Obesity, unspecified: Secondary | ICD-10-CM | POA: Diagnosis not present

## 2014-07-08 DIAGNOSIS — I10 Essential (primary) hypertension: Secondary | ICD-10-CM | POA: Diagnosis not present

## 2014-07-08 DIAGNOSIS — I2510 Atherosclerotic heart disease of native coronary artery without angina pectoris: Secondary | ICD-10-CM | POA: Diagnosis not present

## 2014-07-08 DIAGNOSIS — N3000 Acute cystitis without hematuria: Secondary | ICD-10-CM | POA: Diagnosis not present

## 2014-07-21 DIAGNOSIS — H2513 Age-related nuclear cataract, bilateral: Secondary | ICD-10-CM | POA: Diagnosis not present

## 2014-07-29 DIAGNOSIS — N3000 Acute cystitis without hematuria: Secondary | ICD-10-CM | POA: Diagnosis not present

## 2014-08-03 DIAGNOSIS — N3001 Acute cystitis with hematuria: Secondary | ICD-10-CM | POA: Diagnosis not present

## 2014-08-03 DIAGNOSIS — R300 Dysuria: Secondary | ICD-10-CM | POA: Diagnosis not present

## 2014-08-05 ENCOUNTER — Other Ambulatory Visit: Payer: Self-pay

## 2014-08-05 DIAGNOSIS — H538 Other visual disturbances: Secondary | ICD-10-CM

## 2014-08-13 ENCOUNTER — Ambulatory Visit (INDEPENDENT_AMBULATORY_CARE_PROVIDER_SITE_OTHER): Payer: Medicare Other

## 2014-08-13 DIAGNOSIS — H538 Other visual disturbances: Secondary | ICD-10-CM | POA: Diagnosis not present

## 2014-08-25 DIAGNOSIS — E785 Hyperlipidemia, unspecified: Secondary | ICD-10-CM | POA: Diagnosis not present

## 2014-08-25 DIAGNOSIS — E669 Obesity, unspecified: Secondary | ICD-10-CM | POA: Diagnosis not present

## 2014-08-25 DIAGNOSIS — I10 Essential (primary) hypertension: Secondary | ICD-10-CM | POA: Diagnosis not present

## 2014-08-25 DIAGNOSIS — M79609 Pain in unspecified limb: Secondary | ICD-10-CM | POA: Diagnosis not present

## 2014-08-25 DIAGNOSIS — I739 Peripheral vascular disease, unspecified: Secondary | ICD-10-CM | POA: Diagnosis not present

## 2014-08-31 ENCOUNTER — Other Ambulatory Visit: Payer: Self-pay

## 2014-08-31 ENCOUNTER — Telehealth: Payer: Self-pay | Admitting: *Deleted

## 2014-08-31 ENCOUNTER — Emergency Department
Admission: EM | Admit: 2014-08-31 | Discharge: 2014-08-31 | Disposition: A | Discharge status: Home / Self Care | Payer: Medicare Other | Attending: Emergency Medicine | Admitting: Emergency Medicine

## 2014-08-31 ENCOUNTER — Emergency Department: Payer: Medicare Other

## 2014-08-31 ENCOUNTER — Encounter: Payer: Self-pay | Admitting: Emergency Medicine

## 2014-08-31 DIAGNOSIS — F17200 Nicotine dependence, unspecified, uncomplicated: Secondary | ICD-10-CM | POA: Diagnosis not present

## 2014-08-31 DIAGNOSIS — J441 Chronic obstructive pulmonary disease with (acute) exacerbation: Secondary | ICD-10-CM | POA: Diagnosis not present

## 2014-08-31 DIAGNOSIS — R0602 Shortness of breath: Secondary | ICD-10-CM | POA: Diagnosis present

## 2014-08-31 DIAGNOSIS — I129 Hypertensive chronic kidney disease with stage 1 through stage 4 chronic kidney disease, or unspecified chronic kidney disease: Secondary | ICD-10-CM | POA: Insufficient documentation

## 2014-08-31 DIAGNOSIS — J9809 Other diseases of bronchus, not elsewhere classified: Secondary | ICD-10-CM

## 2014-08-31 DIAGNOSIS — Z7951 Long term (current) use of inhaled steroids: Secondary | ICD-10-CM | POA: Insufficient documentation

## 2014-08-31 DIAGNOSIS — J984 Other disorders of lung: Secondary | ICD-10-CM | POA: Insufficient documentation

## 2014-08-31 DIAGNOSIS — N183 Chronic kidney disease, stage 3 (moderate): Secondary | ICD-10-CM | POA: Diagnosis not present

## 2014-08-31 DIAGNOSIS — Z79899 Other long term (current) drug therapy: Secondary | ICD-10-CM | POA: Diagnosis not present

## 2014-08-31 DIAGNOSIS — F419 Anxiety disorder, unspecified: Secondary | ICD-10-CM | POA: Diagnosis not present

## 2014-08-31 DIAGNOSIS — J45909 Unspecified asthma, uncomplicated: Secondary | ICD-10-CM | POA: Diagnosis not present

## 2014-08-31 LAB — CBC
HCT: 42.6 % (ref 35.0–47.0)
Hemoglobin: 14 g/dL (ref 12.0–16.0)
MCH: 29.3 pg (ref 26.0–34.0)
MCHC: 32.8 g/dL (ref 32.0–36.0)
MCV: 89.2 fL (ref 80.0–100.0)
Platelets: 289 10*3/uL (ref 150–440)
RBC: 4.77 MIL/uL (ref 3.80–5.20)
RDW: 13.7 % (ref 11.5–14.5)
WBC: 9.2 10*3/uL (ref 3.6–11.0)

## 2014-08-31 LAB — BASIC METABOLIC PANEL
Anion gap: 5 (ref 5–15)
BUN: 22 mg/dL — ABNORMAL HIGH (ref 6–20)
CO2: 29 mmol/L (ref 22–32)
Calcium: 8.6 mg/dL — ABNORMAL LOW (ref 8.9–10.3)
Chloride: 102 mmol/L (ref 101–111)
Creatinine, Ser: 1.32 mg/dL — ABNORMAL HIGH (ref 0.44–1.00)
GFR calc Af Amer: 46 mL/min — ABNORMAL LOW (ref >60)
GFR calc non Af Amer: 40 mL/min — ABNORMAL LOW (ref >60)
Glucose, Bld: 203 mg/dL — ABNORMAL HIGH (ref 65–99)
Potassium: 4.6 mmol/L (ref 3.5–5.1)
Sodium: 136 mmol/L (ref 135–145)

## 2014-08-31 LAB — TROPONIN I: Troponin I: <0.03 ng/mL (ref <0.031)

## 2014-08-31 LAB — FIBRIN DERIVATIVES D-DIMER (ARMC ONLY): Fibrin derivatives D-dimer (ARMC): 706 — ABNORMAL HIGH (ref 0–499)

## 2014-08-31 LAB — BRAIN NATRIURETIC PEPTIDE: B Natriuretic Peptide: 55 pg/mL (ref 0.0–100.0)

## 2014-08-31 MED ORDER — IPRATROPIUM-ALBUTEROL 0.5-2.5 (3) MG/3ML IN SOLN
RESPIRATORY_TRACT | Status: AC
  Filled 2014-08-31: qty 3

## 2014-08-31 MED ORDER — ALBUTEROL SULFATE (2.5 MG/3ML) 0.083% IN NEBU
INHALATION_SOLUTION | RESPIRATORY_TRACT | Status: AC
  Administered 2014-08-31: 2.5 mg via RESPIRATORY_TRACT
  Filled 2014-08-31: qty 3

## 2014-08-31 MED ORDER — METHYLPREDNISOLONE SODIUM SUCC 125 MG IJ SOLR
INTRAMUSCULAR | Status: AC
  Administered 2014-08-31: 125 mg via INTRAVENOUS
  Filled 2014-08-31: qty 2

## 2014-08-31 MED ORDER — ALBUTEROL SULFATE (2.5 MG/3ML) 0.083% IN NEBU
2.5000 mg | INHALATION_SOLUTION | Freq: Once | RESPIRATORY_TRACT | Status: AC
  Administered 2014-08-31: 2.5 mg via RESPIRATORY_TRACT

## 2014-08-31 MED ORDER — PREDNISONE 50 MG PO TABS: 50.0000 mg | ORAL_TABLET | Freq: Every day | ORAL | Status: AC

## 2014-08-31 MED ORDER — METHYLPREDNISOLONE SODIUM SUCC 125 MG IJ SOLR
125.0000 mg | Freq: Once | INTRAMUSCULAR | Status: AC
  Administered 2014-08-31: 125 mg via INTRAVENOUS

## 2014-08-31 MED ORDER — IPRATROPIUM-ALBUTEROL 0.5-2.5 (3) MG/3ML IN SOLN
3.0000 mL | Freq: Once | RESPIRATORY_TRACT | Status: AC
  Administered 2014-08-31: 3 mL via RESPIRATORY_TRACT

## 2014-08-31 NOTE — Telephone Encounter (Signed)
Pt c/o of Chest Pain: STAT if CP now or developed within 24 hours  1. Are you having CP right now? Yes can't breath   2. Are you experiencing any other symptoms (ex. SOB, nausea, vomiting, sweating)? SOB   3. How long have you been experiencing CP? A week, gotten worst over the weekend   4. Is your CP continuous or coming and going? Going  5. Have you taken Nitroglycerin? Took one a while back.   Pt c/o Shortness Of Breath: STAT if SOB developed within the last 24 hours or pt is noticeably SOB on the phone  1. Are you currently SOB (can you hear that pt is SOB on the phone)? Kind of   2. How long have you been experiencing SOB? Been about a week   3. Are you SOB when sitting or when up moving around? Right now sitting  4. Are you currently experiencing any other symptoms? No    ?

## 2014-08-31 NOTE — ED Notes (Signed)
Patient states she feels better, but still has a dull pain in right upper chest. Patient able to speak in complete sentences clearly.

## 2014-08-31 NOTE — ED Notes (Signed)
Pt reports that she has been SOB for the last week at all times. She has had a blood clot before and this feels similar. States that she called Dr. Brantley Fling off and they advised her to come here to be evaluated. Pt is only able to speak in 2-3 word sentences.

## 2014-08-31 NOTE — Telephone Encounter (Signed)
S/w pt who states she is unable to walk across room without getting short of breath. Chest pain 5/10, non radiating to arm but continuous. States similar to when she had blood clot in lung in October Indicates been going on for 1 week and now is wheezing.  States she has been taking Eliquis as directed. Would like to get checked out. Will check Dr. Donivan Scull availability and call back but instructed patient to go to ER if she feels it needs to be checked out sooner or if increased chest pain/pressure.

## 2014-08-31 NOTE — Telephone Encounter (Signed)
S/w pt husband Arnell Sieving and recommended she go to ER now and not wait for appointment with Dr. [name]. Husband states he will take her to ER right now but would like her to be seen by Dr. [name] as soon as she can. Will have scheduling call her for possible earlier appt.

## 2014-08-31 NOTE — ED Provider Notes (Signed)
[facility] Emergency Department Provider Note  ____________________________________________  Time seen: On arrival  I have reviewed the triage vital signs and the nursing notes.   HISTORY  Chief Complaint Shortness of Breath    HPI Tammy [name] is a 69 y.o. female who presents with complaints of shortness of breath. She reports  wheezingwhich is unusual for her. She reports a history of a blood clot in the past for which she is on eliquis. She denies chest pain she denies pleurisy. She denies fevers chills. She has no history of COPD, she does not smoke. No history of asthma either. In the past she had an episode of bronchitis which causes wheezing but she denies cough. No leg swelling, no recent travel. Patient has been compliant with her eliquis     Past Medical History  Diagnosis Date  . Coronary artery disease     a. 11/2011 Cath: LAD 71md, LCX min irregs, RCA 70p, 1062mith L->R collats, EF 60%-->Med Rx.  . Marland Kitchenypertension   . PVD (peripheral vascular disease)     a. PTA of right leg and left femoral artery with stenosis.  . Marland KitchenOPD (chronic obstructive pulmonary disease)   . Hyperlipidemia   . CKD (chronic kidney disease), stage III   . Chronic diastolic CHF (congestive heart failure)     a. 07/2012 Echo: Nl EF. mild inferoseptal HK, Gr 2 DD, mild conc LVH, mild PR/TR, mild PAH.  . Stroke   . Palpitations   . Benign neoplasm of colon   . GERD (gastroesophageal reflux disease)   . Hypothyroidism   . History of tobacco abuse     a. Quit 2011.  . PE (pulmonary embolism)     Patient Active Problem List   Diagnosis Date Noted  . Acute pulmonary embolism 01/30/2014  . Acute idiopathic gout of left foot 01/23/2014  . Sinus tachycardia 12/16/2013  . Essential hypertension 12/16/2013  . Diabetes mellitus type 2 with complications, uncontrolled 12/16/2013  . Chronic renal insufficiency, stage III (moderate) 12/16/2013  . Coronary atherosclerosis of  native coronary artery 06/19/2013  . SOB (shortness of breath) 06/19/2013  . Morbid obesity 06/19/2013  . Back pain 06/19/2013  . History of back surgery 06/19/2013  . Hyperlipidemia 06/19/2013  . PAD (peripheral artery disease) 06/19/2013    Past Surgical History  Procedure Laterality Date  . Angioplasty / stenting femoral      PVD; angioplasty right leg and left femoral artery with stenosis.   . Trigger finger release    . Thyroidectomy    . Cholecystectomy    . Cardiac catheterization  12/07/2011    Mid LAD 50%, distal LAD 50%, mid RCA 70%, distal RCA 100%   . Cardiac catheterization  01/04/2011    100% occluded mid RCA with good collaterals from distal LAD, normal LVEF.  . Back surgery  03/2012    Current Outpatient Rx  Name  Route  Sig  Dispense  Refill  . albuterol (PROVENTIL HFA;VENTOLIN HFA) 108 (90 BASE) MCG/ACT inhaler   Inhalation   Inhale 2 puffs into the lungs every 6 (six) hours as needed for wheezing or shortness of breath.         . Marland Kitchenpixaban (ELIQUIS) 5 MG TABS tablet   Oral   Take 1 tablet (5 mg total) by mouth 2 (two) times daily.   180 tablet   3   . aspirin EC 81 MG tablet   Oral   Take 81 mg by mouth daily.         .[name]  cholecalciferol (VITAMIN D) 1000 UNITS tablet   Oral   Take 2,000 Units by mouth daily.         [name] ezetimibe (ZETIA) 10 MG tablet   Oral   Take 1 tablet (10 mg total) by mouth daily.   90 tablet   3   . gabapentin (NEURONTIN) 600 MG tablet   Oral   Take 600 mg by mouth at bedtime.         . isosorbide mononitrate (IMDUR) 60 MG 24 hr tablet   Oral   Take 2 tablets (120 mg total) by mouth daily. Patient taking differently: Take 90 mg by mouth daily.    30 tablet   6   . losartan-hydrochlorothiazide (HYZAAR) 100-12.5 MG per tablet   Oral   Take 0.5 tablets by mouth daily.          . metoprolol succinate (TOPROL-XL) 50 MG 24 hr tablet   Oral   Take 50 mg by mouth daily.          . mirabegron ER (MYRBETRIQ) 25  MG TB24 tablet   Oral   Take 25 mg by mouth daily.         . nitroGLYCERIN (NITROSTAT) 0.4 MG SL tablet   Sublingual   Place 1 tablet (0.4 mg total) under the tongue every 5 (five) minutes as needed for chest pain.   25 tablet   6   . pantoprazole (PROTONIX) 40 MG tablet   Oral   Take 40 mg by mouth at bedtime.          . simvastatin (ZOCOR) 40 MG tablet   Oral   Take 40 mg by mouth daily.         [name] SYNTHROID 112 MCG tablet   Oral   Take 112 mcg by mouth daily before breakfast.          . clindamycin (CLEOCIN) 300 MG capsule   Oral   Take 1 capsule (300 mg total) by mouth 3 (three) times daily.   30 capsule   2   . Fexofenadine HCl (ALLEGRA PO)   Oral   Take by mouth daily.         . fluticasone (FLONASE) 50 MCG/ACT nasal spray   Each Nare   Place 1 spray into both nostrils daily.           Allergies Aleve; Oxycodone; Ranexa; and Tramadol  Family History  Problem Relation Age of Onset  . Hypertension Father   . Heart disease Father   . Heart attack Brother   . Heart attack Brother   . Heart attack Brother     Social History History  Substance Use Topics  . Smoking status: Former Research scientist (life sciences)  . Smokeless tobacco: Not on file  . Alcohol Use: No    Review of Systems  Constitutional: Negative for fever. Eyes: Negative for visual changes. ENT: Negative for sore throat Cardiovascular: Negative for chest pain. Respiratory: Positive shortness of breath Gastrointestinal: Negative for abdominal pain, vomiting and diarrhea. Genitourinary: Negative for dysuria. Musculoskeletal: Negative for back pain. Skin: Negative for rash. Neurological: Negative for headaches or focal weakness Psychiatric: Mild anxiety  10-point ROS otherwise negative.  ____________________________________________   PHYSICAL EXAM:  VITAL SIGNS: ED Triage Vitals  Enc Vitals Group     BP 08/31/14 1026 146/76 mmHg     Pulse Rate 08/31/14 1026 64     Resp 08/31/14 1026 20      Temp 08/31/14 1026 97.7 F (36.5  C)     Temp Source 08/31/14 1026 Oral     SpO2 08/31/14 1026 95 %     Weight 08/31/14 1026 210 lb (95.255 kg)     Height 08/31/14 1026 '5\' 6"'$  (1.676 m)     Head Cir --      Peak Flow --      Pain Score 08/31/14 1027 5     Pain Loc --      Pain Edu? --      Excl. in [place]? --      Constitutional: Alert and oriented. Well appearing and in no distress. Eyes: Conjunctivae are normal.  ENT   Head: Normocephalic and atraumatic.   Mouth/Throat: Mucous membranes are moist. Cardiovascular: Normal rate, regular rhythm. Normal and symmetric distal pulses are present in all extremities. No murmurs, rubs, or gallops. Respiratory: Mild tachypnea with obvious wheezing. Wheezing worse on the left lower lobe. No rales Gastrointestinal: Soft and non-tender in all quadrants. No distention. There is no CVA tenderness. Genitourinary: deferred Musculoskeletal: Nontender with normal range of motion in all extremities. No lower extremity tenderness nor edema. Neurologic:  Normal speech and language. No gross focal neurologic deficits are appreciated. Skin:  Skin is warm, dry and intact. No rash noted. Psychiatric: Mood and affect are normal. Patient exhibits appropriate insight and judgment.  ____________________________________________    LABS (pertinent positives/negatives)  Labs Reviewed  BASIC METABOLIC PANEL - Abnormal; Notable for the following:    Glucose, Bld 203 (*)    BUN 22 (*)    Creatinine, Ser 1.32 (*)    Calcium 8.6 (*)    GFR calc non Af Amer 40 (*)    GFR calc Af Amer 46 (*)    All other components within normal limits  FIBRIN DERIVATIVES D-DIMER ([facility] ONLY) - Abnormal; Notable for the following:    Fibrin derivatives D-dimer ([facility]) 706 (*)    All other components within normal limits  CBC  TROPONIN I  BRAIN NATRIURETIC PEPTIDE    ____________________________________________   EKG  ED ECG REPORT I, Lavonia Drafts, the attending  physician, personally viewed and interpreted this ECG.  Date: 08/31/2014 EKG Time: 10:34 AM Rate: 60 Rhythm: normal sinus rhythm QRS Axis: normal Intervals: normal ST/T Wave abnormalities: normal Conduction Disutrbances: none Narrative Interpretation: unremarkable   ____________________________________________    RADIOLOGY  Chest x-ray shows COPD, reviewed by me  ____________________________________________   PROCEDURES  Procedure(s) performed: none  Critical Care performed: none  ____________________________________________   INITIAL IMPRESSION / ASSESSMENT AND PLAN / ED COURSE  Pertinent labs & imaging results that were available during my care of the patient were reviewed by me and considered in my medical decision making (see chart for details).  Patient's exam is consistent with bronchospasm. We will give slight Medrol IV, DuoNeb as well. ----------------------------------------- 1:26 PM on 08/31/2014 -----------------------------------------  Patient feels significantly better after treatment. Her d-dimer is elevated and we discussed doing a CAT scan however she does not want to take the risk of IV contrast given history of kidney disease. ____________________________________________ ----------------------------------------- 2:25 PM on 08/31/2014 -----------------------------------------  Chest x-ray most consistent with COPD, recommend the patient follow up with PCP for follow-up, pulmonary function tests  FINAL CLINICAL IMPRESSION(S) / ED DIAGNOSES  Final diagnoses:  Bronchospastic airway disease     Lavonia Drafts, MD 08/31/14 1427

## 2014-09-01 DIAGNOSIS — H43812 Vitreous degeneration, left eye: Secondary | ICD-10-CM | POA: Diagnosis not present

## 2014-09-03 NOTE — Telephone Encounter (Signed)
Added her to wait list, for right now we dont have any opens

## 2014-09-21 DIAGNOSIS — N390 Urinary tract infection, site not specified: Secondary | ICD-10-CM | POA: Diagnosis not present

## 2014-09-21 DIAGNOSIS — N1330 Unspecified hydronephrosis: Secondary | ICD-10-CM | POA: Diagnosis not present

## 2014-09-24 DIAGNOSIS — N159 Renal tubulo-interstitial disease, unspecified: Secondary | ICD-10-CM | POA: Diagnosis not present

## 2014-09-24 DIAGNOSIS — N261 Atrophy of kidney (terminal): Secondary | ICD-10-CM | POA: Diagnosis not present

## 2014-09-24 DIAGNOSIS — N1330 Unspecified hydronephrosis: Secondary | ICD-10-CM | POA: Diagnosis not present

## 2014-10-12 ENCOUNTER — Other Ambulatory Visit: Payer: Self-pay | Admitting: Family Medicine

## 2014-10-12 NOTE — Telephone Encounter (Signed)
Per PP- I think she gets this from cardiology. If I'm wrong, please let me know

## 2014-10-12 NOTE — Telephone Encounter (Signed)
Routing to provider  

## 2014-10-13 ENCOUNTER — Other Ambulatory Visit: Payer: Self-pay | Admitting: Cardiovascular Disease

## 2014-10-13 NOTE — Telephone Encounter (Signed)
Pharmacy notified, they will forward request to Dr. [name].

## 2014-10-16 ENCOUNTER — Ambulatory Visit: Payer: PRIVATE HEALTH INSURANCE | Admitting: Cardiovascular Disease

## 2014-10-19 ENCOUNTER — Ambulatory Visit: Payer: PRIVATE HEALTH INSURANCE | Admitting: Cardiovascular Disease

## 2014-10-19 DIAGNOSIS — L821 Other seborrheic keratosis: Secondary | ICD-10-CM | POA: Diagnosis not present

## 2014-10-19 DIAGNOSIS — E669 Obesity, unspecified: Secondary | ICD-10-CM

## 2014-10-19 DIAGNOSIS — I2510 Atherosclerotic heart disease of native coronary artery without angina pectoris: Secondary | ICD-10-CM | POA: Insufficient documentation

## 2014-10-19 DIAGNOSIS — I10 Essential (primary) hypertension: Secondary | ICD-10-CM | POA: Insufficient documentation

## 2014-10-20 ENCOUNTER — Emergency Department: Payer: Medicare Other

## 2014-10-20 ENCOUNTER — Ambulatory Visit (INDEPENDENT_AMBULATORY_CARE_PROVIDER_SITE_OTHER): Payer: Medicare Other | Admitting: Family Medicine

## 2014-10-20 ENCOUNTER — Encounter: Payer: Self-pay | Admitting: Family Medicine

## 2014-10-20 ENCOUNTER — Emergency Department
Admission: EM | Admit: 2014-10-20 | Discharge: 2014-10-20 | Disposition: A | Discharge status: Home / Self Care | Payer: Medicare Other | Attending: Emergency Medicine | Admitting: Emergency Medicine

## 2014-10-20 VITALS — BP 126/80 | HR 72 | Temp 97.5°F | Wt 212.0 lb

## 2014-10-20 DIAGNOSIS — Z87891 Personal history of nicotine dependence: Secondary | ICD-10-CM | POA: Insufficient documentation

## 2014-10-20 DIAGNOSIS — I2510 Atherosclerotic heart disease of native coronary artery without angina pectoris: Secondary | ICD-10-CM

## 2014-10-20 DIAGNOSIS — N183 Chronic kidney disease, stage 3 (moderate): Secondary | ICD-10-CM | POA: Insufficient documentation

## 2014-10-20 DIAGNOSIS — R0602 Shortness of breath: Secondary | ICD-10-CM

## 2014-10-20 DIAGNOSIS — N390 Urinary tract infection, site not specified: Secondary | ICD-10-CM | POA: Diagnosis not present

## 2014-10-20 DIAGNOSIS — Q6239 Other obstructive defects of renal pelvis and ureter: Secondary | ICD-10-CM

## 2014-10-20 DIAGNOSIS — Z7901 Long term (current) use of anticoagulants: Secondary | ICD-10-CM | POA: Insufficient documentation

## 2014-10-20 DIAGNOSIS — Z792 Long term (current) use of antibiotics: Secondary | ICD-10-CM | POA: Diagnosis not present

## 2014-10-20 DIAGNOSIS — Z79899 Other long term (current) drug therapy: Secondary | ICD-10-CM | POA: Insufficient documentation

## 2014-10-20 DIAGNOSIS — Z7982 Long term (current) use of aspirin: Secondary | ICD-10-CM | POA: Diagnosis not present

## 2014-10-20 DIAGNOSIS — R1031 Right lower quadrant pain: Secondary | ICD-10-CM

## 2014-10-20 DIAGNOSIS — I129 Hypertensive chronic kidney disease with stage 1 through stage 4 chronic kidney disease, or unspecified chronic kidney disease: Secondary | ICD-10-CM | POA: Insufficient documentation

## 2014-10-20 DIAGNOSIS — J309 Allergic rhinitis, unspecified: Secondary | ICD-10-CM | POA: Diagnosis not present

## 2014-10-20 DIAGNOSIS — R1084 Generalized abdominal pain: Secondary | ICD-10-CM | POA: Diagnosis not present

## 2014-10-20 DIAGNOSIS — R8271 Bacteriuria: Secondary | ICD-10-CM

## 2014-10-20 DIAGNOSIS — Q6211 Congenital occlusion of ureteropelvic junction: Secondary | ICD-10-CM | POA: Diagnosis not present

## 2014-10-20 DIAGNOSIS — N261 Atrophy of kidney (terminal): Secondary | ICD-10-CM | POA: Diagnosis not present

## 2014-10-20 DIAGNOSIS — R8281 Pyuria: Secondary | ICD-10-CM

## 2014-10-20 DIAGNOSIS — R109 Unspecified abdominal pain: Secondary | ICD-10-CM | POA: Diagnosis present

## 2014-10-20 DIAGNOSIS — E119 Type 2 diabetes mellitus without complications: Secondary | ICD-10-CM | POA: Diagnosis not present

## 2014-10-20 DIAGNOSIS — N1330 Unspecified hydronephrosis: Secondary | ICD-10-CM | POA: Diagnosis not present

## 2014-10-20 LAB — URINALYSIS COMPLETE WITH MICROSCOPIC (ARMC ONLY)
Bilirubin Urine: NEGATIVE
Glucose, UA: 150 mg/dL — AB
Hgb urine dipstick: NEGATIVE
Ketones, ur: NEGATIVE mg/dL
Nitrite: NEGATIVE
Protein, ur: NEGATIVE mg/dL
Specific Gravity, Urine: 1.008 (ref 1.005–1.030)
pH: 6 (ref 5.0–8.0)

## 2014-10-20 LAB — COMPREHENSIVE METABOLIC PANEL
ALT: 15 U/L (ref 14–54)
AST: 20 U/L (ref 15–41)
Albumin: 4 g/dL (ref 3.5–5.0)
Alkaline Phosphatase: 89 U/L (ref 38–126)
Anion gap: 7 (ref 5–15)
BUN: 22 mg/dL — ABNORMAL HIGH (ref 6–20)
CO2: 28 mmol/L (ref 22–32)
Calcium: 9 mg/dL (ref 8.9–10.3)
Chloride: 99 mmol/L — ABNORMAL LOW (ref 101–111)
Creatinine, Ser: 1.27 mg/dL — ABNORMAL HIGH (ref 0.44–1.00)
GFR calc Af Amer: 48 mL/min — ABNORMAL LOW (ref >60)
GFR calc non Af Amer: 41 mL/min — ABNORMAL LOW (ref >60)
Glucose, Bld: 339 mg/dL — ABNORMAL HIGH (ref 65–99)
Potassium: 4.3 mmol/L (ref 3.5–5.1)
Sodium: 134 mmol/L — ABNORMAL LOW (ref 135–145)
Total Bilirubin: 0.5 mg/dL (ref 0.3–1.2)
Total Protein: 7.6 g/dL (ref 6.5–8.1)

## 2014-10-20 LAB — CBC
HCT: 42.4 % (ref 35.0–47.0)
Hemoglobin: 14 g/dL (ref 12.0–16.0)
MCH: 29.3 pg (ref 26.0–34.0)
MCHC: 33 g/dL (ref 32.0–36.0)
MCV: 88.7 fL (ref 80.0–100.0)
Platelets: 271 10*3/uL (ref 150–440)
RBC: 4.78 MIL/uL (ref 3.80–5.20)
RDW: 13.7 % (ref 11.5–14.5)
WBC: 8.5 10*3/uL (ref 3.6–11.0)

## 2014-10-20 LAB — MICROSCOPIC EXAMINATION: WBC, UA: >30 /hpf — AB (ref 0–?)

## 2014-10-20 LAB — LIPASE, BLOOD: Lipase: 32 U/L (ref 22–51)

## 2014-10-20 MED ORDER — MOMETASONE FUROATE 50 MCG/ACT NA SUSP: 2.0000 | Freq: Every day | NASAL | Status: DC

## 2014-10-20 MED ORDER — HYDROCODONE-ACETAMINOPHEN 5-325 MG PO TABS: 1.0000 | ORAL_TABLET | ORAL | Status: DC | PRN

## 2014-10-20 MED ORDER — CEPHALEXIN 500 MG PO CAPS: 500.0000 mg | ORAL_CAPSULE | Freq: Four times a day (QID) | ORAL | Status: AC

## 2014-10-20 MED ORDER — IOHEXOL 240 MG/ML SOLN
50.0000 mL | INTRAMUSCULAR | Status: AC
  Administered 2014-10-20: 50 mL via ORAL

## 2014-10-20 NOTE — ED Notes (Signed)
Pt reports UTI since march and is on 5th round of antibiotics.
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
## 2014-10-20 NOTE — Patient Instructions (Signed)
(  sent to ER)

## 2014-10-20 NOTE — ED Provider Notes (Addendum)
-----------------------------------------   5:31 PM on 10/20/2014 -----------------------------------------  Patient has been seen by Dr. Pat Patrick. Dr. Pat Patrick has reviewed the CT scan, and does not believe it is consistent with appendicitis after examining the patient. Workup is otherwise within normal limits, besides a urinary tract infection. We will discharge the patient home with pain medication as needed, follow up with her primary care doctor. I discussed very strict abdominal pain return precautions with the patient which she is agreeable.  Harvest Dark, MD 10/20/14 1731  Harvest Dark, MD 10/20/14 [id number]

## 2014-10-20 NOTE — Assessment & Plan Note (Signed)
Patient became quite dyspneic upon returning from leaving urine specimen; she has upcoming appt with cardiologist; just went to the ER two weeks ago for this; with her current complaint of RLQ pain, I am sending her to the ER and she can have this addressed there and with cardiologist

## 2014-10-20 NOTE — Discharge Instructions (Signed)
As we have discussed if your abdominal pain does not improve, or worsens within the next 24 hours please return to the emergency department for further evaluation. Return immediately if you develop a fever. He may take your pain medication as needed, as prescribed. Please follow up with her primary care doctor soon as possible.   Abdominal Pain Many things can cause abdominal pain. Usually, abdominal pain is not caused by a disease and will improve without treatment. It can often be observed and treated at home. Your health care provider will do a physical exam and possibly order blood tests and X-rays to help determine the seriousness of your pain. However, in many cases, more time must pass before a clear cause of the pain can be found. Before that point, your health care provider may not know if you need more testing or further treatment. HOME CARE INSTRUCTIONS  Monitor your abdominal pain for any changes. The following actions may help to alleviate any discomfort you are experiencing:  Only take over-the-counter or prescription medicines as directed by your health care provider.  Do not take laxatives unless directed to do so by your health care provider.  Try a clear liquid diet (broth, tea, or water) as directed by your health care provider. Slowly move to a bland diet as tolerated. SEEK MEDICAL CARE IF:  You have unexplained abdominal pain.  You have abdominal pain associated with nausea or diarrhea.  You have pain when you urinate or have a bowel movement.  You experience abdominal pain that wakes you in the night.  You have abdominal pain that is worsened or improved by eating food.  You have abdominal pain that is worsened with eating fatty foods.  You have a fever. SEEK IMMEDIATE MEDICAL CARE IF:   Your pain does not go away within 2 hours.  You keep throwing up (vomiting).  Your pain is felt only in portions of the abdomen, such as the right side or the left lower  portion of the abdomen.  You pass bloody or black tarry stools. MAKE SURE YOU:  Understand these instructions.   Will watch your condition.   Will get help right away if you are not doing well or get worse.  Document Released: 12/07/2004 Document Revised: 03/04/2013 Document Reviewed: 11/06/2012 [facility] Patient Information 2015 [place], [place]. This information is not intended to replace advice given to you by your health care provider. Make sure you discuss any questions you have with your health care provider.

## 2014-10-20 NOTE — Assessment & Plan Note (Signed)
Sees Dr. Bernardo Heater, [facility] urologist

## 2014-10-20 NOTE — Assessment & Plan Note (Signed)
Start nasal corticosteroid

## 2014-10-20 NOTE — ED Provider Notes (Signed)
[facility] Emergency Department Provider Note    ____________________________________________  Time seen: 1315  I have reviewed the triage vital signs and the nursing notes.   HISTORY  Chief Complaint Abdominal Pain   History limited by: Not Limited   HPI Tammy [name] is a 69 y.o. female who presents to the emergency department today because of concerns forright lower quadrant pain. Patient states she has been having the pain for roughly 2 days. She did try to see her primary care doctor this morning who sent her here for concerns for appendicitis. The patient states that she has been having a UTI since March. She has not had any nausea vomiting diarrhea or fever with this pain. She states it is slightly different than the pain she has been having.   Past Medical History  Diagnosis Date  . Coronary artery disease     a. 11/2011 Cath: LAD 19md, LCX min irregs, RCA 70p, 1061mith L->R collats, EF 60%-->Med Rx.  . Marland KitchenVD (peripheral vascular disease)     a. PTA of right leg and left femoral artery with stenosis.  . Marland KitchenOPD (chronic obstructive pulmonary disease)   . CKD (chronic kidney disease), stage III   . Chronic diastolic CHF (congestive heart failure)     a. 07/2012 Echo: Nl EF. mild inferoseptal HK, Gr 2 DD, mild conc LVH, mild PR/TR, mild PAH.  . Stroke   . Palpitations   . Benign neoplasm of colon   . GERD (gastroesophageal reflux disease)   . Hypothyroidism   . History of tobacco abuse     a. Quit 2011.  . PE (pulmonary embolism)   . Hyperlipidemia   . Hypertension   . Obesity   . History of DVT (deep vein thrombosis)     Patient Active Problem List   Diagnosis Date Noted  . Rhinitis, allergic 10/20/2014  . Coronary artery disease   . Hypertension   . Obesity   . Acute pulmonary embolism 01/30/2014  . Acute idiopathic gout of left foot 01/23/2014  . Essential hypertension 12/16/2013  . Diabetes mellitus type 2 with complications,  uncontrolled 12/16/2013  . Chronic renal insufficiency, stage III (moderate) 12/16/2013  . Frequent UTI 11/03/2013  . Coronary atherosclerosis of native coronary artery 06/19/2013  . SOB (shortness of breath) 06/19/2013  . Back pain 06/19/2013  . History of back surgery 06/19/2013  . Hyperlipidemia 06/19/2013  . PAD (peripheral artery disease) 06/19/2013  . Congenital obstruction of ureteropelvic junction 11/21/2011    Past Surgical History  Procedure Laterality Date  . Angioplasty / stenting femoral      PVD; angioplasty right leg and left femoral artery with stenosis.   . Trigger finger release    . Thyroidectomy    . Cholecystectomy    . Cardiac catheterization  12/07/2011    Mid LAD 50%, distal LAD 50%, mid RCA 70%, distal RCA 100%   . Cardiac catheterization  01/04/2011    100% occluded mid RCA with good collaterals from distal LAD, normal LVEF.  . Back surgery  03/2012    Current Outpatient Rx  Name  Route  Sig  Dispense  Refill  . albuterol (PROVENTIL HFA;VENTOLIN HFA) 108 (90 BASE) MCG/ACT inhaler   Inhalation   Inhale 2 puffs into the lungs every 6 (six) hours as needed for wheezing or shortness of breath.         . Marland Kitchenpixaban (ELIQUIS) 5 MG TABS tablet   Oral   Take 1 tablet (5  mg total) by mouth 2 (two) times daily.   180 tablet   3   . aspirin EC 81 MG tablet   Oral   Take 81 mg by mouth daily.         . cholecalciferol (VITAMIN D) 1000 UNITS tablet   Oral   Take 2,000 Units by mouth daily.         [name] ezetimibe (ZETIA) 10 MG tablet   Oral   Take 1 tablet (10 mg total) by mouth daily.   90 tablet   3   . gabapentin (NEURONTIN) 300 MG capsule   Oral   Take 600 mg by mouth at bedtime.         . isosorbide mononitrate (IMDUR) 60 MG 24 hr tablet   Oral   Take 2 tablets (120 mg total) by mouth daily. Patient taking differently: Take 90 mg by mouth daily.    30 tablet   6   . losartan-hydrochlorothiazide (HYZAAR) 100-12.5 MG per tablet   Oral    Take 0.5 tablets by mouth daily.          . metoprolol succinate (TOPROL-XL) 50 MG 24 hr tablet   Oral   Take 50 mg by mouth daily.          . mirabegron ER (MYRBETRIQ) 25 MG TB24 tablet   Oral   Take 25 mg by mouth daily.         . mometasone (NASONEX) 50 MCG/ACT nasal spray   Nasal   Place 2 sprays into the nose daily.   17 g   12   . nitrofurantoin (MACRODANTIN) 50 MG capsule   Oral   Take 50 mg by mouth daily.         . nitroGLYCERIN (NITROSTAT) 0.4 MG SL tablet   Sublingual   Place 1 tablet (0.4 mg total) under the tongue every 5 (five) minutes as needed for chest pain.   25 tablet   6   . pantoprazole (PROTONIX) 40 MG tablet   Oral   Take 40 mg by mouth at bedtime.          . simvastatin (ZOCOR) 40 MG tablet      TAKE ONE TABLET AT BEDTIME   90 tablet   3   . SYNTHROID 112 MCG tablet   Oral   Take 112 mcg by mouth daily before breakfast.            Allergies Aleve; Oxycodone; Ranexa; and Tramadol  Family History  Problem Relation Age of Onset  . Hypertension Father   . Heart disease Father   . Heart attack Father   . Diabetes Father   . Heart attack Brother   . Diabetes Brother   . Hypertension Brother   . Heart disease Brother   . Heart attack Brother   . Diabetes Brother   . Hypertension Brother   . Heart disease Brother   . Heart attack Brother   . Diabetes Brother   . Hypertension Brother   . Heart disease Brother   . Cancer Mother     colon  . Cancer Sister     lung  . COPD Sister   . COPD Sister     Social History History  Substance Use Topics  . Smoking status: Former Smoker -- 0.50 packs/day for 15 years    Types: Cigarettes    Quit date: 03/13/2009  . Smokeless tobacco: Never Used  . Alcohol Use: No  Review of Systems  Constitutional: Negative for fever. Cardiovascular: Negative for chest pain. Respiratory: Negative for shortness of breath. Gastrointestinal: Positive for abdominal pain Genitourinary:  Negative for dysuria. Musculoskeletal: Negative for back pain. Skin: Negative for rash. Neurological: Negative for headaches, focal weakness or numbness.  10-point ROS otherwise negative.  ____________________________________________   PHYSICAL EXAM:  VITAL SIGNS: ED Triage Vitals  Enc Vitals Group     BP 10/20/14 1205 138/57 mmHg     Pulse Rate 10/20/14 1205 71     Resp 10/20/14 1205 17     Temp 10/20/14 1205 97.8 F (36.6 C)     Temp Source 10/20/14 1205 Oral     SpO2 10/20/14 1205 95 %     Weight 10/20/14 1205 212 lb (96.163 kg)     Height 10/20/14 1205 '5\' 6"'$  (1.676 m)     Head Cir --      Peak Flow --      Pain Score 10/20/14 1205 8   Constitutional: Alert and oriented. Well appearing and in no distress. Eyes: Conjunctivae are normal. PERRL. Normal extraocular movements. ENT   Head: Normocephalic and atraumatic.   Nose: No congestion/rhinnorhea.   Mouth/Throat: Mucous membranes are moist.   Neck: No stridor. Hematological/Lymphatic/Immunilogical: No cervical lymphadenopathy. Cardiovascular: Normal rate, regular rhythm.  No murmurs, rubs, or gallops. Respiratory: Normal respiratory effort without tachypnea nor retractions. Breath sounds are clear and equal bilaterally. No wheezes/rales/rhonchi. Gastrointestinal: Soft. Tender to palpation in the RLQ. No rebound. No guarding.  Genitourinary: Deferred Musculoskeletal: Normal range of motion in all extremities. No joint effusions.  No lower extremity tenderness nor edema. Neurologic:  Normal speech and language. No gross focal neurologic deficits are appreciated. Speech is normal.  Skin:  Skin is warm, dry and intact. No rash noted. Psychiatric: Mood and affect are normal. Speech and behavior are normal. Patient exhibits appropriate insight and judgment.  ____________________________________________    LABS (pertinent positives/negatives)  Labs Reviewed  COMPREHENSIVE METABOLIC PANEL - Abnormal; Notable  for the following:    Sodium 134 (*)    Chloride 99 (*)    Glucose, Bld 339 (*)    BUN 22 (*)    Creatinine, Ser 1.27 (*)    GFR calc non Af Amer 41 (*)    GFR calc Af Amer 48 (*)    All other components within normal limits  URINALYSIS COMPLETEWITH MICROSCOPIC ([facility] ONLY) - Abnormal; Notable for the following:    Color, Urine YELLOW (*)    APPearance HAZY (*)    Glucose, UA 150 (*)    Leukocytes, UA 3+ (*)    Bacteria, UA MANY (*)    Squamous Epithelial / LPF 0-5 (*)    All other components within normal limits  URINE CULTURE  LIPASE, BLOOD  CBC     ____________________________________________   EKG  None  ____________________________________________    RADIOLOGY  CT abd/pel IMPRESSION: Atrophic right kidney with moderate right hydronephrosis. Possible chronic right UPJ obstruction. No renal stones  Prominent appendix with mild edema around appendix suggesting early acute appendicitis without abscess.  I, Nance Pear, personally discussed these images and results by phone with the on-call radiologist and used this discussion as part of my medical decision making.    ____________________________________________   PROCEDURES  Procedure(s) performed: None  Critical Care performed: No  ____________________________________________   INITIAL IMPRESSION / ASSESSMENT AND PLAN / ED COURSE  Pertinent labs & imaging results that were available during my care of the patient were reviewed by  me and considered in my medical decision making (see chart for details).  Patient presented to the emergency department with concerns for low right lower quadrant pain. Patient CT is concerning for early appendicitis. Patient however is without fever or leukocytosis. Will have surgery come evaluate patient for possible early appendicitis.  ____________________________________________   FINAL CLINICAL IMPRESSION(S) / ED DIAGNOSES  Final diagnoses:  Right lower  quadrant abdominal pain     Nance Pear, MD 10/21/14 1229

## 2014-10-20 NOTE — ED Notes (Signed)
Pt c/o RLQ pain since Sunday..states she was sent from Dr. Jeananne Rama office to R/O appendicitis.. Denies N/V/D/Fever

## 2014-10-20 NOTE — Progress Notes (Signed)
BP 126/80 mmHg  Pulse 72  Temp(Src) 97.5 F (36.4 C)  Wt 212 lb (96.163 kg)  SpO2 96%   Subjective:    Patient ID: Tammy [name], female    DOB: 06/13/1945, 69 y.o.   MRN: [id number]  HPI: Tammy [name] is a 69 y.o. female  Chief Complaint  Patient presents with  . Abdominal Pain    lower abdominal pain for a while but worse since Saturday. She is currently on antibiotics for UTI.   She is having lower abdominal pain, off and on for a little while but got really bad on Saturday (today is Tuesday); she didn't hardly sleep because of the pain in RLQ; when riding in the car, any little jarring from bumps in the road would hurt RLQ; pain is a little better today, but she couldn't even touch it early yesterday, would hurt so badly; pain is now getting a little better but still significant  She has no hx of diverticulitis; no hx of kidney stones; did have an ovarian cyst 30 years ago  The last time she had anything to eat or drink was coffee and muffin; around 8 am this morning  She has had a UTI since May or so; she is on her 5th antibiotic right now; sees Dr. Bernardo Heater, urologist  She went to the ER two weeks ago; she went for breathing issues; she has had trouble breathing for a while now; she has some heart problems too; has appt with her cardiologist soon; gets winded easily, "can't walk nowhere" without getting [facility]; she got like this real bad one time in October; she got checked and had a blood clot then; this was the reason she was in the ER two weeks ago  Sinus drainage; clear thick mucous; can't afford current nasal spray  Relevant past medical, surgical, family and social history reviewed and updated as indicated. Interim medical history since our last visit reviewed. Allergies and medications reviewed and updated.  Review of Systems  Constitutional: Negative for fever and chills.  Gastrointestinal: Positive for abdominal pain and abdominal distention (just on the right side).  Negative for nausea, vomiting, diarrhea, constipation and blood in stool.  Genitourinary: Positive for dysuria. Negative for vaginal bleeding and vaginal discharge.   Per HPI unless specifically indicated above     Objective:    BP 126/80 mmHg  Pulse 72  Temp(Src) 97.5 F (36.4 C)  Wt 212 lb (96.163 kg)  SpO2 96%  Wt Readings from Last 3 Encounters:  10/20/14 212 lb (96.163 kg)  07/08/14 218 lb (98.884 kg)  08/31/14 210 lb (95.255 kg)    Physical Exam  Constitutional: She appears well-developed and well-nourished. No distress.  HENT:  Head: Normocephalic and atraumatic.  Nose: Rhinorrhea, nasal deformity and septal deviation present. No mucosal edema.  Eyes: EOM are normal. No scleral icterus.  Neck: No thyromegaly present.  Cardiovascular: Normal rate, regular rhythm and normal heart sounds.   No murmur heard. Pulmonary/Chest: Effort normal and breath sounds normal. No respiratory distress. She has no wheezes.  Abdominal: Soft. Normal appearance and bowel sounds are normal. She exhibits no distension. There is tenderness in the right lower quadrant. There is guarding and tenderness at McBurney's point. There is no rebound (but testing may have been affected by such extreme guarding that she did not relax enough for testing).  Musculoskeletal: Normal range of motion. She exhibits no edema.  Neurological: She is alert. She exhibits normal muscle tone.  Skin: Skin  is warm and dry. She is not diaphoretic. No pallor.  Psychiatric: She has a normal mood and affect. Her behavior is normal. Judgment and thought content normal.      Assessment & Plan:   Problem List Items Addressed This Visit      Respiratory   Rhinitis, allergic    Start nasal corticosteroid        Genitourinary   Frequent UTI    Now on 5th antibiotic and urine today showed >30 WBCs/hpf and 3+ LE; she definitely has urinary tract infection, but her symptoms seem above and beyond cystitis; I gave her a copy of  the urine dip and micro to take to the ER with her      Relevant Medications   nitrofurantoin (MACRODANTIN) 50 MG capsule   Congenital obstruction of ureteropelvic junction    Sees Dr. Bernardo Heater, [facility] urologist        Other   SOB (shortness of breath)    Patient became quite dyspneic upon returning from leaving urine specimen; she has upcoming appt with cardiologist; just went to the ER two weeks ago for this; with her current complaint of RLQ pain, I am sending her to the ER and she can have this addressed there and with cardiologist       Other Visit Diagnoses    Right lower quadrant abdominal pain    -  Primary    DDX includes appendicitis, with possible rupture since her pain partially improved just recently, as well as cystitis, diverticulitis; sent to ER; report given    Relevant Orders    UA/M w/rflx Culture, Routine (STAT)    Bacteriuria with pyuria        Relevant Medications    nitrofurantoin (MACRODANTIN) 50 MG capsule       Follow up plan: No Follow-up on file.

## 2014-10-20 NOTE — Assessment & Plan Note (Signed)
Now on 5th antibiotic and urine today showed >30 WBCs/hpf and 3+ LE; she definitely has urinary tract infection, but her symptoms seem above and beyond cystitis; I gave her a copy of the urine dip and micro to take to the ER with her

## 2014-10-22 ENCOUNTER — Telehealth: Payer: Self-pay | Admitting: Family Medicine

## 2014-10-22 LAB — UA/M W/RFLX CULTURE, ROUTINE

## 2014-10-22 LAB — URINE CULTURE, REFLEX

## 2014-10-22 NOTE — Telephone Encounter (Signed)
Patient states she is feeling better. The ER did change her antibiotic. Advised her to call us back if she gets worse or if infection doesn't clear up.

## 2014-10-22 NOTE — Telephone Encounter (Signed)
Please see how she's feeling Let her know that her urine culture is growing out significant infection Did the ER change her antibiotics? If not, have her call Dr. Bernardo Heater about this, as he will want to change her medicine most likely It's best if he covers this since he knows all of her previous cultures and what medicines have been used lately

## 2014-10-23 LAB — URINE CULTURE: Culture: >=100000

## 2014-11-02 DIAGNOSIS — Q6211 Congenital occlusion of ureteropelvic junction: Secondary | ICD-10-CM | POA: Diagnosis not present

## 2014-11-02 DIAGNOSIS — N390 Urinary tract infection, site not specified: Secondary | ICD-10-CM | POA: Diagnosis not present

## 2014-11-02 DIAGNOSIS — Z86711 Personal history of pulmonary embolism: Secondary | ICD-10-CM | POA: Diagnosis not present

## 2014-11-12 ENCOUNTER — Ambulatory Visit: Payer: PRIVATE HEALTH INSURANCE | Admitting: Cardiovascular Disease

## 2014-11-18 ENCOUNTER — Encounter: Payer: Self-pay | Admitting: Cardiovascular Disease

## 2014-11-18 ENCOUNTER — Other Ambulatory Visit: Payer: Self-pay | Admitting: Family Medicine

## 2014-11-18 ENCOUNTER — Ambulatory Visit (INDEPENDENT_AMBULATORY_CARE_PROVIDER_SITE_OTHER): Payer: Medicare Other | Admitting: Cardiovascular Disease

## 2014-11-18 VITALS — BP 120/90 | HR 78 | Ht 66.0 in | Wt 214.0 lb

## 2014-11-18 DIAGNOSIS — E785 Hyperlipidemia, unspecified: Secondary | ICD-10-CM

## 2014-11-18 DIAGNOSIS — I25111 Atherosclerotic heart disease of native coronary artery with angina pectoris with documented spasm: Secondary | ICD-10-CM

## 2014-11-18 DIAGNOSIS — IMO0002 Reserved for concepts with insufficient information to code with codable children: Secondary | ICD-10-CM

## 2014-11-18 DIAGNOSIS — R079 Chest pain, unspecified: Secondary | ICD-10-CM | POA: Insufficient documentation

## 2014-11-18 DIAGNOSIS — I10 Essential (primary) hypertension: Secondary | ICD-10-CM

## 2014-11-18 DIAGNOSIS — E118 Type 2 diabetes mellitus with unspecified complications: Secondary | ICD-10-CM

## 2014-11-18 DIAGNOSIS — E1165 Type 2 diabetes mellitus with hyperglycemia: Secondary | ICD-10-CM

## 2014-11-18 DIAGNOSIS — I209 Angina pectoris, unspecified: Secondary | ICD-10-CM | POA: Diagnosis not present

## 2014-11-18 DIAGNOSIS — I2699 Other pulmonary embolism without acute cor pulmonale: Secondary | ICD-10-CM

## 2014-11-18 DIAGNOSIS — R0789 Other chest pain: Secondary | ICD-10-CM | POA: Insufficient documentation

## 2014-11-18 NOTE — Assessment & Plan Note (Signed)
Recommended that she stay on her anticoagulation. Very sedentary, high risk of recurrent DVT Sits at her computer for hours at a time

## 2014-11-18 NOTE — Assessment & Plan Note (Signed)
Blood pressure is well controlled on today's visit. No changes made to the medications. 

## 2014-11-18 NOTE — Assessment & Plan Note (Addendum)
She's having difficulty affording the zetia. Stay on simvastatin

## 2014-11-18 NOTE — Progress Notes (Signed)
Patient ID: Tammy [name], female    DOB: [birth year], 69 y.o.   MRN: [id number]  HPI Comments: Ms. 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 Sedentary, no exercise program. She complains of chronic shortness of breath, seems to wax and wane. Previously in the emergency room, treated for bronchial asthma. Takes her albuterol with some improvement of her symptoms. Shortness of breath symptoms have been dating back for many years. Weight continues to be a problem. She continues to struggle with urinary tract infection. Reports she is now on her 6 anabiotic. Scheduled for cystoscopy with urology. Recent right lower quadrant discomfort, went to the emergency room. Felt it was not likely her appendix or at least did not need surgery. Expiratory for surgery also not performed as she was on blood thinners.  EKG today's visit shows normal sinus rhythm with rate 78 bpm, no significant ST or T-wave changes  Other past medical history Previous hemoglobin A1c 7.6   may have developed a DVT from sitting long periods of time. 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 Catheterization in 2012 documented occluded mid RCA with left to right collaterals, 40% mid LAD disease, 40% mid circumflex disease, 30% proximal LAD  disease Catheterization in September 2013 showed normal LV function, occluded mid RCA with left to right collaterals, 50% mid and distal LAD disease  Stress test September 2014 showing very small anterior wall defect likely secondary to breast attenuation artifact Echocardiogram May 2014 showing low normal ejection fraction, mild TR, mild pulmonary hypertension      Allergies  Allergen Reactions  . Aleve [Naproxen Sodium]     Hives & itching   . Oxycodone Other (See Comments)    hallucinations  . Ranexa [Ranolazine]     Sick on stomach  . Tramadol Other (See Comments)    nightmares    Outpatient Encounter Prescriptions as of 11/18/2014  Medication Sig  . albuterol (PROAIR HFA) 108 (90 BASE) MCG/ACT inhaler Inhale 2 puffs into the lungs every 4 (four) hours as needed for wheezing or shortness of breath.  [name] apixaban (ELIQUIS) 5 MG TABS tablet Take 1 tablet (5 mg total) by mouth 2 (two) times daily.  [name] aspirin EC 81 MG tablet Take 81 mg by mouth daily.  . cholecalciferol (VITAMIN D) 1000 UNITS tablet Take 1,000 Units by mouth 2 (two) times daily.   [name] ezetimibe (ZETIA) 10 MG tablet Take 1 tablet (10 mg total) by mouth daily.  [name] gabapentin (NEURONTIN) 300 MG capsule Take 600 mg by mouth at bedtime.  . isosorbide mononitrate (IMDUR) 60 MG 24 hr tablet Take 2 tablets (120 mg total) by mouth daily. (Patient taking differently: Take 30-60 mg by mouth 2 (two) times daily. '30mg'$  in the morning and '60mg'$  in the evening)  . losartan-hydrochlorothiazide (HYZAAR) 100-12.5 MG per tablet Take 0.5 tablets by  mouth daily.   . metoprolol succinate (TOPROL-XL) 50 MG 24 hr tablet Take 25 mg by mouth daily.   . mirabegron ER (MYRBETRIQ) 25 MG TB24 tablet Take 25 mg by mouth daily.  . nitrofurantoin (MACRODANTIN) 50 MG capsule Take 50 mg by mouth daily.  . nitroGLYCERIN (NITROSTAT) 0.4 MG SL tablet Place 1 tablet (0.4 mg total) under the tongue every 5 (five) minutes as needed for chest pain.  . pantoprazole  (PROTONIX) 40 MG tablet Take 40 mg by mouth at bedtime.   . simvastatin (ZOCOR) 40 MG tablet TAKE ONE TABLET AT BEDTIME  . SYNTHROID 112 MCG tablet Take 112 mcg by mouth daily before breakfast.   . [DISCONTINUED] HYDROcodone-acetaminophen (NORCO) 5-325 MG per tablet Take 1 tablet by mouth every 4 (four) hours as needed for moderate pain. (Patient not taking: Reported on 11/18/2014)  . [DISCONTINUED] mometasone (NASONEX) 50 MCG/ACT nasal spray Place 2 sprays into the nose daily. (Patient not taking: Reported on 11/18/2014)   No facility-administered encounter medications on file as of 11/18/2014.    Past Medical History  Diagnosis Date  . Coronary artery disease     a. 11/2011 Cath: LAD 67md, LCX min irregs, RCA 70p, 1046mith L->R collats, EF 60%-->Med Rx.  . Marland KitchenVD (peripheral vascular disease)     a. PTA of right leg and left femoral artery with stenosis.  . Marland KitchenOPD (chronic obstructive pulmonary disease)   . CKD (chronic kidney disease), stage III   . Chronic diastolic CHF (congestive heart failure)     a. 07/2012 Echo: Nl EF. mild inferoseptal HK, Gr 2 DD, mild conc LVH, mild PR/TR, mild PAH.  . Stroke   . Palpitations   . Benign neoplasm of colon   . GERD (gastroesophageal reflux disease)   . Hypothyroidism   . History of tobacco abuse     a. Quit 2011.  . PE (pulmonary embolism)   . Hyperlipidemia   . Hypertension   . Obesity   . History of DVT (deep vein thrombosis)     Past Surgical History  Procedure Laterality Date  . Angioplasty / stenting femoral      PVD; angioplasty right leg and left femoral artery with stenosis.   . Trigger finger release    . Thyroidectomy    . Cholecystectomy    . Cardiac catheterization  12/07/2011    Mid LAD 50%, distal LAD 50%, mid RCA 70%, distal RCA 100%   . Cardiac catheterization  01/04/2011    100% occluded mid RCA with good collaterals from distal LAD, normal LVEF.  . Back surgery  03/2012    Social History  reports that she quit smoking  about 5 years ago. Her smoking use included Cigarettes. She has a 7.5 pack-year smoking history. She has never used smokeless tobacco. She reports that she does not drink alcohol or use illicit drugs.  Family History family history includes COPD in her sister and sister; Cancer in her mother and sister; Diabetes in her brother, brother, brother, and father; Heart attack in her brother, brother, brother, and father; Heart disease in her brother, brother, brother, and father; Hypertension in her brother, brother, brother, and father.   Review of Systems  Constitutional: Negative.   Respiratory: Positive for shortness of breath.   Cardiovascular: Negative.   Musculoskeletal: Positive for back pain and gait problem.  Neurological: Negative.   Hematological: Negative.   Psychiatric/Behavioral: Negative.   All other systems reviewed and are negative.   BP 120/90 mmHg  Pulse 78  Ht '5\' 6"'$  (1.676 m)  Wt 214 lb (97.07 kg)  BMI 34.56 kg/m2  Physical Exam  Constitutional: She is oriented to person, place, and time. She appears well-developed and well-nourished.  HENT:  Head: Normocephalic.  Nose: Nose normal.  Mouth/Throat: Oropharynx is clear and moist.  Eyes: Conjunctivae are normal. Pupils are equal, round, and reactive to light.  Neck: Normal range of motion. Neck supple. No JVD present.  Cardiovascular: Normal rate, regular rhythm, S1 normal, S2 normal, normal heart sounds and intact distal pulses.  Exam reveals no gallop and no friction rub.   No murmur heard. Pulmonary/Chest: Effort normal and breath sounds normal. No respiratory distress. She has no wheezes. She has no rales. She exhibits no tenderness.  Abdominal: Soft. Bowel sounds are normal. She exhibits no distension. There is no tenderness.  Musculoskeletal: Normal range of motion. She exhibits no edema or tenderness.  Lymphadenopathy:    She has no cervical adenopathy.  Neurological: She is alert and oriented to person,  place, and time. Coordination normal.  Skin: Skin is warm and dry. No rash noted. No erythema.  Psychiatric: She has a normal mood and affect. Her behavior is normal. Judgment and thought content normal.    Assessment and Plan  Nursing note and vitals reviewed.

## 2014-11-18 NOTE — Assessment & Plan Note (Signed)
Chest pain somewhat atypical on today's visit, described as a sharp pain lasting for a very brief period of time No further workup at this time

## 2014-11-18 NOTE — Assessment & Plan Note (Signed)
Atypical type chest pain as detailed above, no further testing

## 2014-11-18 NOTE — Patient Instructions (Addendum)
You are doing well. No medication changes were made.  We will schedule labs (liver and lipids) at your convenience (fasting)  Inhaler:  Symbicort, advair, dulera, spiriva  Don't forget to walk!  Please call us if you have new issues that need to be addressed before your next appt.  Your physician wants you to follow-up in: 6 months.  You will receive a reminder letter in the mail two months in advance. If you don't receive a letter, please call our office to schedule the follow-up appointment.

## 2014-11-18 NOTE — Assessment & Plan Note (Signed)
We have encouraged continued exercise, careful diet management in an effort to lose weight. 

## 2014-11-27 DIAGNOSIS — J069 Acute upper respiratory infection, unspecified: Secondary | ICD-10-CM | POA: Diagnosis not present

## 2014-11-27 DIAGNOSIS — H6502 Acute serous otitis media, left ear: Secondary | ICD-10-CM | POA: Diagnosis not present

## 2014-12-08 DIAGNOSIS — N390 Urinary tract infection, site not specified: Secondary | ICD-10-CM | POA: Diagnosis not present

## 2014-12-12 HISTORY — PX: APPENDECTOMY: SHX54

## 2014-12-16 ENCOUNTER — Encounter: Payer: Self-pay | Admitting: Family Medicine

## 2014-12-16 ENCOUNTER — Ambulatory Visit (INDEPENDENT_AMBULATORY_CARE_PROVIDER_SITE_OTHER): Payer: Medicare Other | Admitting: Family Medicine

## 2014-12-16 VITALS — BP 109/65 | HR 65 | Temp 98.2°F | Wt 209.0 lb

## 2014-12-16 DIAGNOSIS — R1032 Left lower quadrant pain: Secondary | ICD-10-CM | POA: Diagnosis not present

## 2014-12-16 DIAGNOSIS — N183 Chronic kidney disease, stage 3 unspecified: Secondary | ICD-10-CM

## 2014-12-16 DIAGNOSIS — R1031 Right lower quadrant pain: Secondary | ICD-10-CM | POA: Insufficient documentation

## 2014-12-16 DIAGNOSIS — Z23 Encounter for immunization: Secondary | ICD-10-CM | POA: Diagnosis not present

## 2014-12-16 DIAGNOSIS — N189 Chronic kidney disease, unspecified: Secondary | ICD-10-CM | POA: Diagnosis not present

## 2014-12-16 DIAGNOSIS — N390 Urinary tract infection, site not specified: Secondary | ICD-10-CM | POA: Diagnosis not present

## 2014-12-16 DIAGNOSIS — K5730 Diverticulosis of large intestine without perforation or abscess without bleeding: Secondary | ICD-10-CM

## 2014-12-16 DIAGNOSIS — E785 Hyperlipidemia, unspecified: Secondary | ICD-10-CM | POA: Diagnosis not present

## 2014-12-16 DIAGNOSIS — I209 Angina pectoris, unspecified: Secondary | ICD-10-CM | POA: Diagnosis not present

## 2014-12-16 LAB — MICROSCOPIC EXAMINATION: Renal Epithel, UA: NONE SEEN /hpf

## 2014-12-16 NOTE — Assessment & Plan Note (Signed)
Monitor lipid panel for Dr. [name]

## 2014-12-16 NOTE — Assessment & Plan Note (Addendum)
Under the care of urologist; she has been on multiple antibiotics over the last few months; will get CT scan to evaluate further

## 2014-12-16 NOTE — Assessment & Plan Note (Signed)
No IV or oral contrast

## 2014-12-16 NOTE — Assessment & Plan Note (Addendum)
ddx includes cystitis, appendicitis, diverticulitis, ovarian pathology; will get CT scan of abdomen but she cannot have contrast which will limit pictures

## 2014-12-16 NOTE — Progress Notes (Signed)
BP 109/65 mmHg  Pulse 65  Temp(Src) 98.2 F (36.8 C)  Wt 209 lb (94.802 kg)  SpO2 96%   Subjective:    Patient ID: Tammy [name], female    DOB: February 22, 1946, 69 y.o.   MRN: [id number]  HPI: Tammy [name] is a 69 y.o. female  Chief Complaint  Patient presents with  . Nausea    nausea, threw up on Sat., just feels sick on her stomach, wonders if it is coming from antibiotic.  . Abdominal Pain    it is better since her last appt, but not gone away  . Itching    from her head to toes  . Fatigue    no energy, headaches.   Patient is here for ongoing pain in her lower abdomen She was seen August 9th and sent from here straight to the ER; reviewed that note from Dr. Nance Pear Reviewed  She saw Dr. Bernardo Heater last Tuesday; he was going to do a cystoscopy but she had such a bad urinary tract infection; she is on medicine (antibiotic) for that, Ceftin; she finishes this on Friday and then starts low dose until she has cystoscopy on October 20th The nurse told him about her abdominal pain so he knows about it she says She stays tired all the time She gets real hot and then has chills really bad, almost on a daily basis, some days are worse; like electricity going through her body; gets cold and hot; just sweats Her bowels are pretty good, but the last antibiotic can cause constipation; BMs every day, but maybe not as good as before the antibiotic She will itch all over; almost like something is crawling through her veins, like something is crawling on her; nothing on her Weight loss five pounds since Sept 7th (just under a month); thought of food is terrible; not too bad the last few days; eats a few bites and then feels like she might be sick; did vomit on Friday or Saturday, all she could do to make it to the bathroom; like morning sickness; tries ginger ale and that helps settle it; drank green tea but the thought of it makes her sick Still has both ovaries, has abdominal bloating Had butter  biscuit and coffee at 8 am (now 11:52 am)  Relevant past medical, surgical, family and social history reviewed and updated as indicated. Interim medical history since our last visit reviewed. Allergies and medications reviewed and updated.  Review of Systems Per HPI unless specifically indicated above     Objective:    BP 109/65 mmHg  Pulse 65  Temp(Src) 98.2 F (36.8 C)  Wt 209 lb (94.802 kg)  SpO2 96%  Wt Readings from Last 3 Encounters:  12/16/14 209 lb (94.802 kg)  11/18/14 214 lb (97.07 kg)  10/20/14 212 lb (96.163 kg)    Physical Exam  Constitutional: She appears well-developed and well-nourished. No distress.  Weight loss of five pounds in 4 weeks  HENT:  Head: Normocephalic and atraumatic.  Eyes: EOM are normal. No scleral icterus.  Neck: No thyromegaly present.  Cardiovascular: Normal rate, regular rhythm and normal heart sounds.   No murmur heard. Pulmonary/Chest: Effort normal and breath sounds normal. No respiratory distress. She has no wheezes.  Abdominal: Soft. Bowel sounds are normal. She exhibits no distension. There is tenderness (mild tenderness RLQ >> LLQ).  Musculoskeletal: Normal range of motion. She exhibits no edema.  Neurological: She is alert. She exhibits normal muscle tone.  Skin: Skin  is warm and dry. She is not diaphoretic. No pallor.  Psychiatric: She has a normal mood and affect. Her behavior is normal. Judgment and thought content normal.      Assessment & Plan:   Problem List Items Addressed This Visit      Digestive   Diverticulosis of colon    Diverticulitis in the ddx        Genitourinary   Chronic renal insufficiency, stage III (moderate)    No IV or oral contrast      Frequent UTI    Under the care of urologist; she has been on multiple antibiotics over the last few months; will get CT scan to evaluate further        Other   Hyperlipidemia    Monitor lipid panel for Dr. [name]      Relevant Orders   Lipid Panel w/o  Chol/HDL Ratio   Lipid Panel w/o Chol/HDL Ratio   Bilateral lower abdominal pain - Primary    ddx includes cystitis, appendicitis, diverticulitis, ovarian pathology; will get CT scan of abdomen but she cannot have contrast which will limit pictures      Relevant Orders   UA/M w/rflx Culture, Routine (Completed)   CT Abdomen Pelvis Wo Contrast (Completed)   CBC with Differential/Platelet   Comprehensive metabolic panel   CBC with Differential/Platelet   Comprehensive metabolic panel    Other Visit Diagnoses    Encounter for immunization        Needs flu shot        flu vaccine offered and given       Follow up plan: No Follow-up on file.  An after-visit summary was printed and given to the patient at [facility].  Please see the patient instructions which may contain other information and recommendations beyond what is mentioned above in the assessment and plan.  Orders Placed This Encounter  Procedures  . Microscopic Examination  . CT Abdomen Pelvis Wo Contrast  . Flu Vaccine QUAD 36+ mos IM  . UA/M w/rflx Culture, Routine  . CBC with Differential/Platelet  . Comprehensive metabolic panel  . Lipid Panel w/o Chol/HDL Ratio  . CBC with Differential/Platelet  . Comprehensive metabolic panel  . Lipid Panel w/o Chol/HDL Ratio

## 2014-12-16 NOTE — Patient Instructions (Addendum)
My staff will contact you about the CT scan Please do go from here to the hospital for labs (CBC, CMP, lipids) These orders have been entered twice in EPIC Please call with any problems, [phone]

## 2014-12-17 ENCOUNTER — Other Ambulatory Visit
Admission: RE | Admit: 2014-12-17 | Discharge: 2014-12-17 | Disposition: A | Discharge status: Home / Self Care | Payer: Medicare Other | Source: Ambulatory Visit | Attending: Family Medicine | Admitting: Family Medicine

## 2014-12-17 DIAGNOSIS — R1032 Left lower quadrant pain: Secondary | ICD-10-CM | POA: Insufficient documentation

## 2014-12-17 DIAGNOSIS — R1031 Right lower quadrant pain: Secondary | ICD-10-CM | POA: Insufficient documentation

## 2014-12-17 DIAGNOSIS — R1084 Generalized abdominal pain: Secondary | ICD-10-CM | POA: Diagnosis not present

## 2014-12-17 LAB — LIPID PANEL
Cholesterol: 142 mg/dL (ref 0–200)
HDL: 42 mg/dL (ref >40)
LDL Cholesterol: 64 mg/dL (ref 0–99)
Total CHOL/HDL Ratio: 3.4 RATIO
Triglycerides: 180 mg/dL — ABNORMAL HIGH (ref <150)
VLDL: 36 mg/dL (ref 0–40)

## 2014-12-17 LAB — CBC WITH DIFFERENTIAL/PLATELET
Basophils Absolute: 0.1 10*3/uL (ref 0–0.1)
Basophils Relative: 1 %
Eosinophils Absolute: 0.4 10*3/uL (ref 0–0.7)
Eosinophils Relative: 4 %
HCT: 40.7 % (ref 35.0–47.0)
Hemoglobin: 13.3 g/dL (ref 12.0–16.0)
Lymphocytes Relative: 28 %
Lymphs Abs: 2.6 10*3/uL (ref 1.0–3.6)
MCH: 29.1 pg (ref 26.0–34.0)
MCHC: 32.8 g/dL (ref 32.0–36.0)
MCV: 88.6 fL (ref 80.0–100.0)
Monocytes Absolute: 0.9 10*3/uL (ref 0.2–0.9)
Monocytes Relative: 9 %
Neutro Abs: 5.5 10*3/uL (ref 1.4–6.5)
Neutrophils Relative %: 58 %
Platelets: 290 10*3/uL (ref 150–440)
RBC: 4.59 MIL/uL (ref 3.80–5.20)
RDW: 13.8 % (ref 11.5–14.5)
WBC: 9.5 10*3/uL (ref 3.6–11.0)

## 2014-12-17 LAB — COMPREHENSIVE METABOLIC PANEL
ALT: 15 U/L (ref 14–54)
AST: 19 U/L (ref 15–41)
Albumin: 3.8 g/dL (ref 3.5–5.0)
Alkaline Phosphatase: 81 U/L (ref 38–126)
Anion gap: 8 (ref 5–15)
BUN: 21 mg/dL — ABNORMAL HIGH (ref 6–20)
CO2: 28 mmol/L (ref 22–32)
Calcium: 9.2 mg/dL (ref 8.9–10.3)
Chloride: 102 mmol/L (ref 101–111)
Creatinine, Ser: 1.11 mg/dL — ABNORMAL HIGH (ref 0.44–1.00)
GFR calc Af Amer: 57 mL/min — ABNORMAL LOW (ref >60)
GFR calc non Af Amer: 49 mL/min — ABNORMAL LOW (ref >60)
Glucose, Bld: 201 mg/dL — ABNORMAL HIGH (ref 65–99)
Potassium: 4.4 mmol/L (ref 3.5–5.1)
Sodium: 138 mmol/L (ref 135–145)
Total Bilirubin: 0.9 mg/dL (ref 0.3–1.2)
Total Protein: 7 g/dL (ref 6.5–8.1)

## 2014-12-17 LAB — UA/M W/RFLX CULTURE, ROUTINE: Organism ID, Bacteria: NO GROWTH

## 2014-12-18 ENCOUNTER — Telehealth: Payer: Self-pay | Admitting: Family Medicine

## 2014-12-18 DIAGNOSIS — K5730 Diverticulosis of large intestine without perforation or abscess without bleeding: Secondary | ICD-10-CM | POA: Insufficient documentation

## 2014-12-18 DIAGNOSIS — R1031 Right lower quadrant pain: Secondary | ICD-10-CM

## 2014-12-18 DIAGNOSIS — R1032 Left lower quadrant pain: Secondary | ICD-10-CM

## 2014-12-18 DIAGNOSIS — I700 Atherosclerosis of aorta: Secondary | ICD-10-CM

## 2014-12-18 NOTE — Assessment & Plan Note (Signed)
Found on CT scan; discussed with patient today by phone; reviewed most recent LDL, under 70 (goal); rescan aorta in 5 years

## 2014-12-18 NOTE — Assessment & Plan Note (Signed)
Work-up has included CT scan; she will talk with her urologist as to whether the hydroureter (chronic, right side) could explain some of her pain; will refer to general surgeon for evaluation, consideration of colonoscopy

## 2014-12-18 NOTE — Telephone Encounter (Signed)
I talked with patient earlier today (around lunch) about her lab results and CT scan Discussed full of stool, severe diverticulosis; she said she moves her bowels regularly, had good results after the CT scan I encouraged her to talk with her urologist about the kidney findings, to see whether or not her hydroureter would explain any of her pain I also encouraged her to see someone about getting a colonoscopy; the doctor who did her last colonoscopy is not practicing any more Discussed need for [place] to look at aorta again in five years

## 2014-12-21 ENCOUNTER — Encounter: Payer: Self-pay | Admitting: *Deleted

## 2014-12-21 NOTE — Assessment & Plan Note (Signed)
Diverticulitis in the ddx

## 2014-12-28 ENCOUNTER — Other Ambulatory Visit: Payer: Self-pay | Admitting: Cardiovascular Disease

## 2014-12-29 ENCOUNTER — Ambulatory Visit (INDEPENDENT_AMBULATORY_CARE_PROVIDER_SITE_OTHER): Payer: Medicare Other | Admitting: General Surgery

## 2014-12-29 ENCOUNTER — Encounter: Payer: Self-pay | Admitting: General Surgery

## 2014-12-29 VITALS — BP 124/72 | HR 76 | Resp 12 | Ht 66.0 in | Wt 210.0 lb

## 2014-12-29 DIAGNOSIS — I209 Angina pectoris, unspecified: Secondary | ICD-10-CM | POA: Diagnosis not present

## 2014-12-29 DIAGNOSIS — R1031 Right lower quadrant pain: Secondary | ICD-10-CM

## 2014-12-29 NOTE — Progress Notes (Signed)
Patient ID: Tammy [name], female   DOB: [birth year], 69 y.o.   MRN: [id number]  Chief Complaint  Patient presents with  . Abdominal Pain    HPI Tammy [name] is a 69 y.o. female here today for a evaluation of abdominal pain. She states the pain has been going on for about four months now. It starts in the lower left quadrant and moves to the right lower area. She states the right side is the worse. Ct scan done 12/17/14.  HPI  Past Medical History  Diagnosis Date  . Coronary artery disease     a. 11/2011 Cath: LAD 41md, LCX min irregs, RCA 70p, 106mith L->R collats, EF 60%-->Med Rx.  . Mar[place]VD (peripheral [place] disease) [place]    a. PTA of right leg and left femoral artery with stenosis.  . Marland [place]OPD (chronic obstructive pulmonary disease) [place]  . CKD (chronic kidney disease), stage III   . Chronic diastolic CHF (congestive heart failure) [place]    a. 07/2012 Echo: Nl EF. mild inferoseptal HK, Gr 2 DD, mild conc LVH, mild PR/TR, mild PAH.  . [place]  . Palpitations   . Benign neoplasm of colon   . GERD (gastroesophageal reflux disease)   . Hypothyroidism   . History of tobacco abuse     a. Quit 2011.  . PE (pulmonary embolism)   . Hyperlipidemia   . Hypertension   . Obesity   . History of DVT (deep vein thrombosis)     Past Surgical History  Procedure Laterality Date  . Angioplasty / stenting femoral      PVD; angioplasty right leg and left femoral artery with stenosis.   . Trigger finger release    . Thyroidectomy    . Cholecystectomy    . Cardiac catheterization  12/07/2011    Mid LAD 50%, distal LAD 50%, mid RCA 70%, distal RCA 100%   . Cardiac catheterization  01/04/2011    100% occluded mid RCA with good collaterals from distal LAD, normal LVEF.  . Back surgery  03/2012  . Colonoscopy  2013    Family History  Problem Relation Age of Onset  . Hypertension Father   . Heart disease Father   . Heart attack Father   . Diabetes Father   . Heart attack Brother   .  Diabetes Brother   . Hypertension Brother   . Heart disease Brother   . Heart attack Brother   . Diabetes Brother   . Hypertension Brother   . Heart disease Brother   . Heart attack Brother   . Diabetes Brother   . Hypertension Brother   . Heart disease Brother   . Cancer Mother     colon  . Cancer Sister     lung  . COPD Sister   . COPD Sister     Social History Social History  Substance Use Topics  . Smoking status: Former Smoker -- 0.50 packs/day for 15 years    Types: Cigarettes    Quit date: 03/13/2009  . Smokeless tobacco: Never Used  . Alcohol Use: No    Allergies  Allergen Reactions  . Aleve [Naproxen Sodium]     Hives & itching   . Oxycodone Other (See Comments)    hallucinations  . Ranexa [Ranolazine]     Sick on stomach  . Tramadol Other (See Comments)    nightmares    Current Outpatient Prescriptions  Medication Sig Dispense Refill  . albuterol (  PROAIR HFA) 108 (90 BASE) MCG/ACT inhaler Inhale 2 puffs into the lungs every 4 (four) hours as needed for wheezing or shortness of breath. 17 g 0  . apixaban (ELIQUIS) 5 MG TABS tablet Take 1 tablet (5 mg total) by mouth 2 (two) times daily. 180 tablet 3  . cholecalciferol (VITAMIN D) 1000 UNITS tablet Take 1,000 Units by mouth 2 (two) times daily.     [name] ezetimibe (ZETIA) 10 MG tablet Take 1 tablet (10 mg total) by mouth daily. 90 tablet 3  . fluticasone (FLONASE) 50 MCG/ACT nasal spray     . gabapentin (NEURONTIN) 300 MG capsule Take 600 mg by mouth at bedtime.    . isosorbide mononitrate (IMDUR) 60 MG 24 hr tablet Take 2 tablets (120 mg total) by mouth daily. (Patient taking differently: Take 30-60 mg by mouth 2 (two) times daily. '30mg'$  in the morning and '60mg'$  in the evening) 30 tablet 6  . losartan-hydrochlorothiazide (HYZAAR) 100-12.5 MG per tablet Take 0.5 tablets by mouth daily.     . metoprolol succinate (TOPROL-XL) 50 MG 24 hr tablet TAKE ONE TABLET EVERY DAY 60 tablet 11  . mirabegron ER (MYRBETRIQ) 25  MG TB24 tablet Take 25 mg by mouth daily.    . nitroGLYCERIN (NITROSTAT) 0.4 MG SL tablet Place 1 tablet (0.4 mg total) under the tongue every 5 (five) minutes as needed for chest pain. 25 tablet 6  . pantoprazole (PROTONIX) 40 MG tablet Take 40 mg by mouth at bedtime.     . simvastatin (ZOCOR) 40 MG tablet TAKE ONE TABLET AT BEDTIME 90 tablet 3  . SYNTHROID 112 MCG tablet Take 112 mcg by mouth daily before breakfast.      No current facility-administered medications for this visit.    Review of Systems Review of Systems  Constitutional: Negative.   Respiratory: Negative.   Cardiovascular: Negative.     Blood pressure 124/72, pulse 76, resp. rate 12, height '5\' 6"'$  (1.676 m), weight 210 lb (95.255 kg).  Physical Exam Physical Exam  Constitutional: She is oriented to person, place, and time. She appears well-developed and well-nourished.  Eyes: Conjunctivae are normal. No scleral icterus.  Neck: Neck supple.  Cardiovascular: Normal rate, regular rhythm and normal heart sounds.   Pulses:      Dorsalis pedis pulses are 2+ on the right side, and 2+ on the left side.       Posterior tibial pulses are 2+ on the right side, and 2+ on the left side.  Pulmonary/Chest: Effort normal and breath sounds normal.  Abdominal: Soft. Normal appearance and bowel sounds are normal. There is no hepatomegaly. There is tenderness (moderate tenderness, well localised in right lower quadrant) in the right lower quadrant. No hernia.  Lymphadenopathy:    She has no cervical adenopathy.  Neurological: She is alert and oriented to person, place, and time.  Skin: Skin is warm and dry.    Data Reviewed Ct scans reviewed  In August, there was mild edema of appendix. CT from last week shows the appendix has more normal size. She has a chronic mild hydronephrosis on right from known UPJ stenosis-being followed by urology. Pat had colonoscopy 3 yrs ago-tiny polyp removed from cecum Assessment    Localised pain  in RLQ, above CT findings suggest chronic and/or subacute appendicitis.    Plan   Discussed laparoscopic eval and appendectomy, hopefully this will resolve her abdominal pain. Colonoscopy is not due for 2 more years. Patient to have Laparoscopy. Discuss possibly appendectomy  Procedure and risks/beneits discussed. Patient agrees.   Patient is scheduled for surgery at [facility] on 01/06/15. She will pre admit at the hospital on 12/31/14 at 7:30 am. Patient is aware of date and instructions.  PCP:  Karle Plumber G 12/30/2014, 5:54 AM

## 2014-12-29 NOTE — Patient Instructions (Addendum)
  Diagnostic Laparoscopy A diagnostic laparoscopy is a procedure to diagnose diseases in the abdomen. During the procedure, a thin, lighted, pencil-sized instrument called a laparoscope is inserted into the abdomen through an incision. The laparoscope allows your health care provider to look at the organs inside your body. LET [facility] CARE PROVIDER KNOW ABOUT:  Any allergies you have.  All medicines you are taking, including vitamins, herbs, eye drops, creams, and over-the-counter medicines.  Previous problems you or members of your family have had with the use of anesthetics.  Any blood disorders you have.  Previous surgeries you have had.  Medical conditions you have. RISKS AND COMPLICATIONS  Generally, this is a safe procedure. However, problems can occur, which may include:  Infection.  Bleeding.  Damage to other organs.  Allergic reaction to the anesthetics used during the procedure. BEFORE THE PROCEDURE  Do not eat or drink anything after midnight on the night before the procedure or as directed by your health care provider.  Ask your health care provider about:  Changing or stopping your regular medicines.  Taking medicines such as aspirin and ibuprofen. These medicines can thin your blood. Do not take these medicines before your procedure if your health care provider instructs you not to.  Plan to have someone take you home after the procedure. PROCEDURE  You may be given a medicine to help you relax (sedative).  You will be given a medicine to make you sleep (general anesthetic).  Your abdomen will be inflated with a gas. This will make your organs easier to see.  Small incisions will be made in your abdomen.  A laparoscope and other small instruments will be inserted into the abdomen through the incisions.  A tissue sample may be removed from an organ in the abdomen for examination.  The instruments will be removed from the abdomen.  The gas will be  released.  The incisions will be closed with stitches (sutures). AFTER THE PROCEDURE  Your blood pressure, heart rate, breathing rate, and blood oxygen level will be monitored often until the medicines you were given have worn off.   This information is not intended to replace advice given to you by your health care provider. Make sure you discuss any questions you have with your health care provider.   Document Released: 06/05/2000 Document Revised: 11/18/2014 Document Reviewed: 10/10/2013 Elsevier Interactive Patient Education [employer].   Patient is scheduled for surgery at [facility] on 01/06/15. She will pre admit at the hospital on 12/31/14 at 7:30 am. Patient is aware of date and instructions.

## 2014-12-30 ENCOUNTER — Encounter: Payer: Self-pay | Admitting: General Surgery

## 2014-12-31 ENCOUNTER — Encounter
Admission: RE | Admit: 2014-12-31 | Discharge: 2014-12-31 | Disposition: A | Discharge status: Home / Self Care | Payer: Medicare Other | Source: Ambulatory Visit | Attending: General Surgery | Admitting: General Surgery

## 2014-12-31 ENCOUNTER — Encounter: Payer: Self-pay | Admitting: *Deleted

## 2014-12-31 ENCOUNTER — Telehealth: Payer: Self-pay | Admitting: *Deleted

## 2014-12-31 DIAGNOSIS — Z01818 Encounter for other preprocedural examination: Secondary | ICD-10-CM | POA: Diagnosis not present

## 2014-12-31 DIAGNOSIS — N390 Urinary tract infection, site not specified: Secondary | ICD-10-CM | POA: Diagnosis not present

## 2014-12-31 DIAGNOSIS — R109 Unspecified abdominal pain: Secondary | ICD-10-CM | POA: Insufficient documentation

## 2014-12-31 HISTORY — DX: Other specified postprocedural states: Z98.890

## 2014-12-31 HISTORY — DX: Nausea with vomiting, unspecified: R11.2

## 2014-12-31 NOTE — Telephone Encounter (Signed)
Request for surgical clearance:  1. What type of surgery is being performed? appendoctomy  2. When is this surgery scheduled?Wed 01/06/15  3. Are there any medications that need to be held prior to surgery and how long? Eloquis  4. Name of physician performing surgery? Dr. Jamal Collin  5. What is your office phone and fax number? 6.

## 2014-12-31 NOTE — Telephone Encounter (Signed)
Tammy [name], you should run this past Dr. [name], since he'll know her best.  She is on Eliquis for a h/o PE last year and per his last note, she is high risk for recurrent DVT 2/2 sedentary lifestyle.  I suspect he may want to bridge her w/ lovenox.

## 2014-12-31 NOTE — Telephone Encounter (Signed)
Forward to [employer]

## 2014-12-31 NOTE — Telephone Encounter (Signed)
Appendectomy 10/26 Pt takes eliquis '5mg'$  BID Pt asks if she should hold prior to surgery Forward to Ignacia Bayley, NP

## 2014-12-31 NOTE — Patient Instructions (Signed)
  Your procedure is scheduled on: Wednesday 01/06/15 Report to Day Surgery. 2ND FLOOR MEDICAL MALL ENTRANCE To find out your arrival time please call [phone] between 1PM - 3PM on Tuesday 01/05/15.  Remember: Instructions that are not followed completely may result in serious medical risk, up to and including death, or upon the discretion of your surgeon and anesthesiologist your surgery may need to be rescheduled.    __X__ 1. Do not eat food or drink liquids after midnight. No gum chewing or hard candies.     __X__ 2. No Alcohol for 24 hours before or after surgery.   ____ 3. Bring all medications with you on the day of surgery if instructed.    __X__ 4. Notify your doctor if there is any change in your medical condition     (cold, fever, infections).     Do not wear jewelry, make-up, hairpins, clips or nail polish.  Do not wear lotions, powders, or perfumes.   Do not shave 48 hours prior to surgery. Men may shave face and neck.  Do not bring valuables to the hospital.    [facility] is not responsible for any belongings or valuables.               Contacts, dentures or bridgework may not be worn into surgery.  Leave your suitcase in the car. After surgery it may be brought to your room.  For patients admitted to the hospital, discharge time is determined by your                treatment team.   Patients discharged the day of surgery will not be allowed to drive home.   Please read over the following fact sheets that you were given:   Surgical Site Infection Prevention   __X__ Take these medicines the morning of surgery with A SIP OF WATER:    1. ISOSORBIDE  2. METOPROLOL  3. PANTOPRAZOLE (TAKE AT BEDTIME AS USUAL AND AGAIN THE MORNING OF SURGERY)  4. SYNTHROID  5.  6.  ____ Fleet Enema (as directed)   __X__ Use CHG Soap as directed  __X__ Use inhalers on the day of surgery   USE YOUR INHALER THE MORNING OF SURGERY  ____ Stop metformin 2 days prior to  surgery    ____ Take 1/2 of usual insulin dose the night before surgery and none on the morning of surgery.   __X__ Stop Coumadin/Plavix/aspirin on ELOQUIS USUALLY STOPPED 48 HOURS BEFORE SURGERY, CONTACT DR. [name] REGARDING ABILITY TO STOP FOR SURGERY  ____ Stop Anti-inflammatories on    ____ Stop supplements until after surgery.    ____ Bring C-Pap to the hospital.

## 2015-01-01 NOTE — Telephone Encounter (Signed)
Patient checking in appt what to hold prior to surgery.  Patient aware that we are waiting on a response from Dr. [name] and we will let her know.

## 2015-01-01 NOTE — Telephone Encounter (Signed)
This encounter was created in error - please disregard.

## 2015-01-02 NOTE — Telephone Encounter (Signed)
Would hold eliquis 2 days prior to surgery, No bridge needed Would wear compression hose for days before and after surgery Restart eliquis when ok with Dr. Jamal Collin

## 2015-01-04 NOTE — Telephone Encounter (Signed)
Spoke w/ pt.  Advised her of Dr. Gollan's recommendation. She verbalizes understanding and will call back w/ any questions or concerns.  

## 2015-01-05 ENCOUNTER — Ambulatory Visit: Payer: PRIVATE HEALTH INSURANCE | Admitting: General Surgery

## 2015-01-06 ENCOUNTER — Ambulatory Visit
Admission: RE | Admit: 2015-01-06 | Discharge: 2015-01-06 | Disposition: A | Discharge status: Home / Self Care | Payer: Medicare Other | Source: Ambulatory Visit | Attending: General Surgery | Admitting: General Surgery

## 2015-01-06 ENCOUNTER — Ambulatory Visit: Payer: Medicare Other | Admitting: Certified Registered Nurse Anesthetist

## 2015-01-06 ENCOUNTER — Encounter
Admission: RE | Disposition: A | Discharge status: Home / Self Care | Payer: Self-pay | Source: Ambulatory Visit | Attending: General Surgery

## 2015-01-06 ENCOUNTER — Encounter: Payer: Self-pay | Admitting: *Deleted

## 2015-01-06 DIAGNOSIS — Z833 Family history of diabetes mellitus: Secondary | ICD-10-CM | POA: Insufficient documentation

## 2015-01-06 DIAGNOSIS — E039 Hypothyroidism, unspecified: Secondary | ICD-10-CM | POA: Diagnosis not present

## 2015-01-06 DIAGNOSIS — Z8249 Family history of ischemic heart disease and other diseases of the circulatory system: Secondary | ICD-10-CM | POA: Insufficient documentation

## 2015-01-06 DIAGNOSIS — Z95820 Peripheral vascular angioplasty status with implants and grafts: Secondary | ICD-10-CM | POA: Diagnosis not present

## 2015-01-06 DIAGNOSIS — Z7901 Long term (current) use of anticoagulants: Secondary | ICD-10-CM | POA: Insufficient documentation

## 2015-01-06 DIAGNOSIS — Z86711 Personal history of pulmonary embolism: Secondary | ICD-10-CM | POA: Insufficient documentation

## 2015-01-06 DIAGNOSIS — J449 Chronic obstructive pulmonary disease, unspecified: Secondary | ICD-10-CM | POA: Insufficient documentation

## 2015-01-06 DIAGNOSIS — I130 Hypertensive heart and chronic kidney disease with heart failure and stage 1 through stage 4 chronic kidney disease, or unspecified chronic kidney disease: Secondary | ICD-10-CM | POA: Diagnosis not present

## 2015-01-06 DIAGNOSIS — K36 Other appendicitis: Secondary | ICD-10-CM | POA: Insufficient documentation

## 2015-01-06 DIAGNOSIS — Z885 Allergy status to narcotic agent status: Secondary | ICD-10-CM | POA: Insufficient documentation

## 2015-01-06 DIAGNOSIS — Z6833 Body mass index (BMI) 33.0-33.9, adult: Secondary | ICD-10-CM | POA: Diagnosis not present

## 2015-01-06 DIAGNOSIS — Z87891 Personal history of nicotine dependence: Secondary | ICD-10-CM | POA: Diagnosis not present

## 2015-01-06 DIAGNOSIS — K219 Gastro-esophageal reflux disease without esophagitis: Secondary | ICD-10-CM | POA: Diagnosis not present

## 2015-01-06 DIAGNOSIS — I2510 Atherosclerotic heart disease of native coronary artery without angina pectoris: Secondary | ICD-10-CM | POA: Insufficient documentation

## 2015-01-06 DIAGNOSIS — E785 Hyperlipidemia, unspecified: Secondary | ICD-10-CM | POA: Diagnosis not present

## 2015-01-06 DIAGNOSIS — R1031 Right lower quadrant pain: Secondary | ICD-10-CM

## 2015-01-06 DIAGNOSIS — Z8673 Personal history of transient ischemic attack (TIA), and cerebral infarction without residual deficits: Secondary | ICD-10-CM | POA: Diagnosis not present

## 2015-01-06 DIAGNOSIS — Z9049 Acquired absence of other specified parts of digestive tract: Secondary | ICD-10-CM | POA: Diagnosis not present

## 2015-01-06 DIAGNOSIS — Z888 Allergy status to other drugs, medicaments and biological substances status: Secondary | ICD-10-CM | POA: Insufficient documentation

## 2015-01-06 DIAGNOSIS — N183 Chronic kidney disease, stage 3 (moderate): Secondary | ICD-10-CM | POA: Diagnosis not present

## 2015-01-06 DIAGNOSIS — E669 Obesity, unspecified: Secondary | ICD-10-CM | POA: Insufficient documentation

## 2015-01-06 DIAGNOSIS — R5383 Other fatigue: Secondary | ICD-10-CM | POA: Insufficient documentation

## 2015-01-06 DIAGNOSIS — Z9889 Other specified postprocedural states: Secondary | ICD-10-CM | POA: Diagnosis not present

## 2015-01-06 DIAGNOSIS — Z800 Family history of malignant neoplasm of digestive organs: Secondary | ICD-10-CM | POA: Insufficient documentation

## 2015-01-06 DIAGNOSIS — Z86718 Personal history of other venous thrombosis and embolism: Secondary | ICD-10-CM | POA: Diagnosis not present

## 2015-01-06 DIAGNOSIS — Z801 Family history of malignant neoplasm of trachea, bronchus and lung: Secondary | ICD-10-CM | POA: Diagnosis not present

## 2015-01-06 DIAGNOSIS — I509 Heart failure, unspecified: Secondary | ICD-10-CM | POA: Insufficient documentation

## 2015-01-06 DIAGNOSIS — I739 Peripheral vascular disease, unspecified: Secondary | ICD-10-CM | POA: Diagnosis not present

## 2015-01-06 DIAGNOSIS — Z836 Family history of other diseases of the respiratory system: Secondary | ICD-10-CM | POA: Diagnosis not present

## 2015-01-06 DIAGNOSIS — Z79899 Other long term (current) drug therapy: Secondary | ICD-10-CM | POA: Diagnosis not present

## 2015-01-06 DIAGNOSIS — R1084 Generalized abdominal pain: Secondary | ICD-10-CM | POA: Diagnosis not present

## 2015-01-06 DIAGNOSIS — R109 Unspecified abdominal pain: Secondary | ICD-10-CM | POA: Diagnosis not present

## 2015-01-06 DIAGNOSIS — Z886 Allergy status to analgesic agent status: Secondary | ICD-10-CM | POA: Diagnosis not present

## 2015-01-06 HISTORY — PX: LAPAROSCOPIC APPENDECTOMY: SHX408

## 2015-01-06 SURGERY — APPENDECTOMY, LAPAROSCOPIC
Anesthesia: General | Site: Abdomen | Wound class: Clean Contaminated

## 2015-01-06 MED ORDER — ACETAMINOPHEN 10 MG/ML IV SOLN
INTRAVENOUS | Status: DC | PRN
  Administered 2015-01-06: 1000 mg via INTRAVENOUS

## 2015-01-06 MED ORDER — ALBUTEROL SULFATE HFA 108 (90 BASE) MCG/ACT IN AERS
INHALATION_SPRAY | RESPIRATORY_TRACT | Status: DC | PRN
  Administered 2015-01-06: 6 via RESPIRATORY_TRACT

## 2015-01-06 MED ORDER — OXYCODONE-ACETAMINOPHEN 5-325 MG PO TABS: 1.0000 | ORAL_TABLET | Freq: Once | ORAL | Status: DC

## 2015-01-06 MED ORDER — FENTANYL CITRATE (PF) 100 MCG/2ML IJ SOLN
INTRAMUSCULAR | Status: DC | PRN
  Administered 2015-01-06 (×3): 50 ug via INTRAVENOUS

## 2015-01-06 MED ORDER — MEPERIDINE HCL 25 MG/ML IJ SOLN: 6.2500 mg | INTRAMUSCULAR | Status: DC | PRN

## 2015-01-06 MED ORDER — ACETAMINOPHEN 10 MG/ML IV SOLN
INTRAVENOUS | Status: AC
  Filled 2015-01-06: qty 100

## 2015-01-06 MED ORDER — PROPOFOL 10 MG/ML IV BOLUS
INTRAVENOUS | Status: DC | PRN
  Administered 2015-01-06: 130 mg via INTRAVENOUS

## 2015-01-06 MED ORDER — FENTANYL CITRATE (PF) 100 MCG/2ML IJ SOLN
25.0000 ug | INTRAMUSCULAR | Status: DC | PRN
  Administered 2015-01-06 (×4): 25 ug via INTRAVENOUS

## 2015-01-06 MED ORDER — OXYCODONE HCL 5 MG PO TABS
ORAL_TABLET | ORAL | Status: AC
  Administered 2015-01-06: 5 mg
  Filled 2015-01-06: qty 1

## 2015-01-06 MED ORDER — SUGAMMADEX SODIUM 200 MG/2ML IV SOLN
INTRAVENOUS | Status: DC | PRN
  Administered 2015-01-06: 186 mg via INTRAVENOUS

## 2015-01-06 MED ORDER — EPHEDRINE SULFATE 50 MG/ML IJ SOLN
INTRAMUSCULAR | Status: DC | PRN
  Administered 2015-01-06: 15 mg via INTRAVENOUS

## 2015-01-06 MED ORDER — OXYCODONE HCL 5 MG PO TABS: 5.0000 mg | ORAL_TABLET | Freq: Once | ORAL | Status: DC

## 2015-01-06 MED ORDER — MIDAZOLAM HCL 2 MG/2ML IJ SOLN
INTRAMUSCULAR | Status: DC | PRN
  Administered 2015-01-06: 2 mg via INTRAVENOUS

## 2015-01-06 MED ORDER — CHLORHEXIDINE GLUCONATE 4 % EX LIQD: 1.0000 "application " | Freq: Once | CUTANEOUS | Status: DC

## 2015-01-06 MED ORDER — ONDANSETRON HCL 4 MG/2ML IJ SOLN
INTRAMUSCULAR | Status: DC | PRN
  Administered 2015-01-06: 4 mg via INTRAVENOUS

## 2015-01-06 MED ORDER — SODIUM CHLORIDE 0.9 % IV SOLN
1.0000 g | INTRAVENOUS | Status: AC
  Administered 2015-01-06: 1 g via INTRAVENOUS
  Filled 2015-01-06: qty 1

## 2015-01-06 MED ORDER — METOCLOPRAMIDE HCL 5 MG/ML IJ SOLN: 10.0000 mg | Freq: Once | INTRAMUSCULAR | Status: DC | PRN

## 2015-01-06 MED ORDER — FENTANYL CITRATE (PF) 100 MCG/2ML IJ SOLN
INTRAMUSCULAR | Status: AC
  Administered 2015-01-06: 25 ug via INTRAVENOUS
  Filled 2015-01-06: qty 2

## 2015-01-06 MED ORDER — ROCURONIUM BROMIDE 100 MG/10ML IV SOLN
INTRAVENOUS | Status: DC | PRN
  Administered 2015-01-06: 5 mg via INTRAVENOUS
  Administered 2015-01-06: 30 mg via INTRAVENOUS

## 2015-01-06 MED ORDER — SUCCINYLCHOLINE CHLORIDE 20 MG/ML IJ SOLN
INTRAMUSCULAR | Status: DC | PRN
Start: 2015-01-06 — End: 2015-01-06
  Administered 2015-01-06: 100 mg via INTRAVENOUS

## 2015-01-06 MED ORDER — LACTATED RINGERS IV SOLN
INTRAVENOUS | Status: DC
  Administered 2015-01-06 (×2): via INTRAVENOUS

## 2015-01-06 SURGICAL SUPPLY — 36 items
ANCHOR TIS RET SYS 235ML (MISCELLANEOUS) ×2 IMPLANT
BLADE SURG 11 STRL SS SAFETY (MISCELLANEOUS) ×2 IMPLANT
CANISTER SUCT 1200ML W/VALVE (MISCELLANEOUS) ×2 IMPLANT
CANNULA DILATOR 12 W/SLV (CANNULA) ×2 IMPLANT
CATH TRAY 16F METER LATEX (MISCELLANEOUS) ×2 IMPLANT
CHLORAPREP W/TINT 26ML (MISCELLANEOUS) ×2 IMPLANT
CUTTER LINEAR ENDO 35 ART THIN (STAPLE) ×2 IMPLANT
DEFOGGER SCOPE WARMER CLEARIFY (MISCELLANEOUS) ×2 IMPLANT
DRESSING TELFA 4X3 1S ST N-ADH (GAUZE/BANDAGES/DRESSINGS) ×2 IMPLANT
DRSG TEGADERM 2-3/8X2-3/4 SM (GAUZE/BANDAGES/DRESSINGS) ×2 IMPLANT
GLOVE BIO SURGEON STRL SZ7 (GLOVE) ×14 IMPLANT
GOWN STRL REUS W/ TWL LRG LVL3 (GOWN DISPOSABLE) ×2 IMPLANT
GOWN STRL REUS W/TWL LRG LVL3 (GOWN DISPOSABLE) ×2
GRASPER SUT TROCAR 14GX15 (MISCELLANEOUS) ×2 IMPLANT
IRRIGATION STRYKERFLOW (MISCELLANEOUS) IMPLANT
IRRIGATOR STRYKERFLOW (MISCELLANEOUS)
IV LACTATED RINGERS 1000ML (IV SOLUTION) IMPLANT
KIT RM TURNOVER STRD PROC AR (KITS) ×2 IMPLANT
LABEL OR SOLS (LABEL) ×2 IMPLANT
LIQUID BAND (GAUZE/BANDAGES/DRESSINGS) ×2 IMPLANT
NDL INSUFF ACCESS 14 VERSASTEP (NEEDLE) ×2 IMPLANT
NS IRRIG 500ML POUR BTL (IV SOLUTION) ×2 IMPLANT
PACK LAP CHOLECYSTECTOMY (MISCELLANEOUS) ×2 IMPLANT
PAD GROUND ADULT SPLIT (MISCELLANEOUS) ×2 IMPLANT
RELOAD /EVU35 (ENDOMECHANICALS) ×2 IMPLANT
RELOAD CUTTER ETS 35MM STAND (ENDOMECHANICALS) ×2 IMPLANT
SCISSORS METZENBAUM CVD 33 (INSTRUMENTS) IMPLANT
SEAL FOR SCOPE WARMER C3101 (MISCELLANEOUS) IMPLANT
STRIP CLOSURE SKIN 1/2X4 (GAUZE/BANDAGES/DRESSINGS) ×2 IMPLANT
SUT VIC AB 0 SH 27 (SUTURE) ×2 IMPLANT
SUT VIC AB 4-0 FS2 27 (SUTURE) ×2 IMPLANT
SWABSTK COMLB BENZOIN TINCTURE (MISCELLANEOUS) ×2 IMPLANT
TROCAR XCEL 12X100 BLDLESS (ENDOMECHANICALS) ×2 IMPLANT
TROCAR XCEL NON-BLD 5MMX100MML (ENDOMECHANICALS) ×2 IMPLANT
TUBING INSUFFLATOR HI FLOW (MISCELLANEOUS) ×2 IMPLANT
WATER STERILE IRR 1000ML POUR (IV SOLUTION) ×2 IMPLANT

## 2015-01-06 NOTE — Interval H&P Note (Signed)
History and Physical Interval Note:  01/06/2015 9:44 AM  Tammy [name]  has presented today for surgery, with the diagnosis of Right abdominal pain  The various methods of treatment have been discussed with the patient and family. After consideration of risks, benefits and other options for treatment, the patient has consented to  Procedure(s): APPENDECTOMY LAPAROSCOPIC (N/A) as a surgical intervention .  The patient's history has been reviewed, patient examined, no change in status, stable for surgery.  I have reviewed the patient's chart and labs.  Questions were answered to the patient's satisfaction.     Meloney Feld G

## 2015-01-06 NOTE — H&P (View-Only) (Signed)
BP 109/65 mmHg  Pulse 65  Temp(Src) 98.2 F (36.8 C)  Wt 209 lb (94.802 kg)  SpO2 96%   Subjective:    Patient ID: Tammy [name], female    DOB: 09-24-1945, 69 y.o.   MRN: [id number]  HPI: Tammy [name] is a 69 y.o. female  Chief Complaint  Patient presents with  . Nausea    nausea, threw up on Sat., just feels sick on her stomach, wonders if it is coming from antibiotic.  . Abdominal Pain    it is better since her last appt, but not gone away  . Itching    from her head to toes  . Fatigue    no energy, headaches.   Patient is here for ongoing pain in her lower abdomen She was seen August 9th and sent from here straight to the ER; reviewed that note from Dr. Nance Pear Reviewed  She saw Dr. Bernardo Heater last Tuesday; he was going to do a cystoscopy but she had such a bad urinary tract infection; she is on medicine (antibiotic) for that, Ceftin; she finishes this on Friday and then starts low dose until she has cystoscopy on October 20th The nurse told him about her abdominal pain so he knows about it she says She stays tired all the time She gets real hot and then has chills really bad, almost on a daily basis, some days are worse; like electricity going through her body; gets cold and hot; just sweats Her bowels are pretty good, but the last antibiotic can cause constipation; BMs every day, but maybe not as good as before the antibiotic She will itch all over; almost like something is crawling through her veins, like something is crawling on her; nothing on her Weight loss five pounds since Sept 7th (just under a month); thought of food is terrible; not too bad the last few days; eats a few bites and then feels like she might be sick; did vomit on Friday or Saturday, all she could do to make it to the bathroom; like morning sickness; tries ginger ale and that helps settle it; drank green tea but the thought of it makes her sick Still has both ovaries, has abdominal bloating Had butter  biscuit and coffee at 8 am (now 11:52 am)  Relevant past medical, surgical, family and social history reviewed and updated as indicated. Interim medical history since our last visit reviewed. Allergies and medications reviewed and updated.  Review of Systems Per HPI unless specifically indicated above     Objective:    BP 109/65 mmHg  Pulse 65  Temp(Src) 98.2 F (36.8 C)  Wt 209 lb (94.802 kg)  SpO2 96%  Wt Readings from Last 3 Encounters:  12/16/14 209 lb (94.802 kg)  11/18/14 214 lb (97.07 kg)  10/20/14 212 lb (96.163 kg)    Physical Exam  Constitutional: She appears well-developed and well-nourished. No distress.  Weight loss of five pounds in 4 weeks  HENT:  Head: Normocephalic and atraumatic.  Eyes: EOM are normal. No scleral icterus.  Neck: No thyromegaly present.  Cardiovascular: Normal rate, regular rhythm and normal heart sounds.   No murmur heard. Pulmonary/Chest: Effort normal and breath sounds normal. No respiratory distress. She has no wheezes.  Abdominal: Soft. Bowel sounds are normal. She exhibits no distension. There is tenderness (mild tenderness RLQ >> LLQ).  Musculoskeletal: Normal range of motion. She exhibits no edema.  Neurological: She is alert. She exhibits normal muscle tone.  Skin: Skin  is warm and dry. She is not diaphoretic. No pallor.  Psychiatric: She has a normal mood and affect. Her behavior is normal. Judgment and thought content normal.      Assessment & Plan:   Problem List Items Addressed This Visit      Digestive   Diverticulosis of colon    Diverticulitis in the ddx        Genitourinary   Chronic renal insufficiency, stage III (moderate)    No IV or oral contrast      Frequent UTI    Under the care of urologist; she has been on multiple antibiotics over the last few months; will get CT scan to evaluate further        Other   Hyperlipidemia    Monitor lipid panel for Dr. [name]      Relevant Orders   Lipid Panel w/o  Chol/HDL Ratio   Lipid Panel w/o Chol/HDL Ratio   Bilateral lower abdominal pain - Primary    ddx includes cystitis, appendicitis, diverticulitis, ovarian pathology; will get CT scan of abdomen but she cannot have contrast which will limit pictures      Relevant Orders   UA/M w/rflx Culture, Routine (Completed)   CT Abdomen Pelvis Wo Contrast (Completed)   CBC with Differential/Platelet   Comprehensive metabolic panel   CBC with Differential/Platelet   Comprehensive metabolic panel    Other Visit Diagnoses    Encounter for immunization        Needs flu shot        flu vaccine offered and given       Follow up plan: No Follow-up on file.  An after-visit summary was printed and given to the patient at [place].  Please see the patient instructions which may contain other information and recommendations beyond what is mentioned above in the assessment and plan.  Orders Placed This Encounter  Procedures  . Microscopic Examination  . CT Abdomen Pelvis Wo Contrast  . Flu Vaccine QUAD 36+ mos IM  . UA/M w/rflx Culture, Routine  . CBC with Differential/Platelet  . Comprehensive metabolic panel  . Lipid Panel w/o Chol/HDL Ratio  . CBC with Differential/Platelet  . Comprehensive metabolic panel  . Lipid Panel w/o Chol/HDL Ratio

## 2015-01-06 NOTE — Anesthesia Preprocedure Evaluation (Addendum)
Anesthesia Evaluation  Patient identified by MRN, date of birth, ID band Patient awake    Reviewed: Allergy & Precautions, H&P , NPO status , Patient's Chart, lab work & pertinent test results, reviewed documented beta blocker date and time   History of Anesthesia Complications (+) PONV  Airway Mallampati: II  TM Distance: >3 FB Neck ROM: Full    Dental  (+) Teeth Intact   Pulmonary neg pulmonary ROS, asthma , former smoker, PE   breath sounds clear to auscultation       Cardiovascular hypertension, negative cardio ROS   Rhythm:Regular Rate:Normal     Neuro/Psych TIAnegative neurological ROS  negative psych ROS   GI/Hepatic negative GI ROS, Neg liver ROS, GERD  ,  Endo/Other  negative endocrine ROSdiabetes, Poorly Controlled, Type 2Hypothyroidism   Renal/GU Renal diseasenegative Renal ROS  negative genitourinary   Musculoskeletal negative musculoskeletal ROS (+)   Abdominal (+) + obese,  Abdomen: soft.    Peds negative pediatric ROS (+)  Hematology negative hematology ROS (+)   Anesthesia Other Findings   Reproductive/Obstetrics negative OB ROS                            Anesthesia Physical Anesthesia Plan  ASA: II  Anesthesia Plan: General   Post-op Pain Management:    Induction: Intravenous  Airway Management Planned: Oral ETT  Additional Equipment:   Intra-op Plan:   Post-operative Plan:   Informed Consent:   Dental advisory given  Plan Discussed with: CRNA and Surgeon  Anesthesia Plan Comments:         Anesthesia Quick Evaluation

## 2015-01-06 NOTE — Discharge Instructions (Signed)
AMBULATORY SURGERY  DISCHARGE INSTRUCTIONS   1) The drugs that you were given will stay in your system until tomorrow so for the next 24 hours you should not:  A) Drive an automobile B) Make any legal decisions C) Drink any alcoholic beverage   2) You may resume regular meals tomorrow.  Today it is better to start with liquids and gradually work up to solid foods.  You may eat anything you prefer, but it is better to start with liquids, then soup and crackers, and gradually work up to solid foods.   3) Please notify your doctor immediately if you have any unusual bleeding, trouble breathing, redness and pain at the surgery site, drainage, fever, or pain not relieved by medication.    4) Additional Instructions:  May shower whenever you desire        Please contact your physician with any problems or Same Day Surgery at [phone], Monday through Friday 6 am to 4 pm, or [place] [facility] number at [phone].

## 2015-01-06 NOTE — Transfer of Care (Signed)
Immediate Anesthesia Transfer of Care Note  Patient: Tammy [name]  Procedure(s) Performed: Procedure(s): APPENDECTOMY LAPAROSCOPIC (N/A)  Patient Location: PACU  Anesthesia Type:General  Level of Consciousness: sedated  Airway & Oxygen Therapy: Patient Spontanous Breathing and Patient connected to face mask oxygen  Post-op Assessment: Report given to RN and Post -op Vital signs reviewed and stable  Post vital signs: Reviewed and stable  Last Vitals:  Filed Vitals:   01/06/15 1120  BP: 152/81  Pulse: 85  Temp: 36.2 C  Resp: 14    Complications: No apparent anesthesia complications

## 2015-01-06 NOTE — Op Note (Signed)
Preop diagnosis: Persistent right lower quadrant abdominal pain and abnormal imaging of the appendix  Post op diagnosis: Same, suspected chronic appendicitis  Operation: Laparoscopy and appendectomy  Surgeon: S.G.Dalonda Simoni  Assistant:     Anesthesia: Gen.  Complications: None  EBL: Minimal  Drains: None  Description: Patient was put to sleep in supine position the operating table. Foley catheter was inserted and was removed at the end of the procedure. Abdomen was prepped and draped out as sterile field. Timeout was performed. Port incision was made just along the upper lip of the umbilicus and Veress needle with the InnerDyne sleeve positioned in the peritoneal cavity verified of the hanging drop method. 10 mm port was placed after pneumoperitoneum was obtained. Camera was introduced. There was good visualization of the peritoneal cavity. Suprapubic 5 mm in the left lower quadrant 12 mm ports were placed. No adhesions or abnormality of the small intestine noted in the right lower quadrant. No hernias noted. The cecum was lying close to the mid abdominal region with an appendix that was lying lateral to it. No acute inflammation was noted. By imaging 3 months ago the patient had a mildly edematous appendix and a subsequent CT done 2 weeks ago showed this has returned to her normal size. Given persistent right lower quadrant pain and the abnormal imaging it was felt that more than likely the appendix was the source of her pain. The base of the appendix was freed from the mesoappendix and then was taken down with the use of the blue load of the GIA. Following this 2 white loads of the GIA were used to take down the mesoappendix. The appendix was placed in a retrieval bag and brought out through the left lower quadrant port site. The staple lines were inspected and irrigated with 20 mL of fluid suctioned out. The omentum was placed back overlying this area. The liver appeared normal as also the right  colon. Pneumoperitoneum was released the remaining ports removed. The fascial opening at the left lower quadrant port side was closed with 0 Vicryl. Skin incisions were closed with subcuticular 4-0 Vicryl covered with liqui  Ban. Patient subsequently was returned to PACU in stable condition.

## 2015-01-06 NOTE — Anesthesia Procedure Notes (Signed)
Procedure Name: Intubation Date/Time: 01/06/2015 10:15 AM Performed by: Allean Found Pre-anesthesia Checklist: Patient identified, Emergency Drugs available, Suction available, Patient being monitored and Timeout performed Patient Re-evaluated:Patient Re-evaluated prior to inductionOxygen Delivery Method: Circle system utilized Preoxygenation: Pre-oxygenation with 100% oxygen Intubation Type: IV induction Ventilation: Mask ventilation without difficulty Laryngoscope Size: Mac and 3 Tube type: Oral Number of attempts: 1 Airway Equipment and Method: Stylet Secured at: 21 cm Tube secured with: Tape Dental Injury: Teeth and Oropharynx as per pre-operative assessment

## 2015-01-06 NOTE — Anesthesia Postprocedure Evaluation (Signed)
  Anesthesia Post-op Note  Patient: Tammy [name]  Procedure(s) Performed: Procedure(s): APPENDECTOMY LAPAROSCOPIC (N/A)  Anesthesia type:General  Patient location: PACU  Post pain: Pain level controlled  Post assessment: Post-op Vital signs reviewed, Patient's Cardiovascular Status Stable, Respiratory Function Stable, Patent Airway and No signs of Nausea or vomiting  Post vital signs: Reviewed and stable  Last Vitals:  Filed Vitals:   01/06/15 1418  BP: 134/61  Pulse: 69  Temp:   Resp: 16    Level of consciousness: awake, alert  and patient cooperative  Complications: No apparent anesthesia complications

## 2015-01-08 LAB — SURGICAL PATHOLOGY

## 2015-01-11 ENCOUNTER — Other Ambulatory Visit: Payer: Self-pay | Admitting: Cardiovascular Disease

## 2015-01-13 ENCOUNTER — Other Ambulatory Visit: Payer: Self-pay | Admitting: Family Medicine

## 2015-01-13 NOTE — Telephone Encounter (Signed)
Routing to provider  

## 2015-01-13 NOTE — Telephone Encounter (Signed)
Great to hear; glad that appendix is out I sent the other SABA so she'll have it; glad to hear she's not running through them very fast Staff says she does not need a call back

## 2015-01-13 NOTE — Telephone Encounter (Signed)
She said she just opened the one from Sept 7th. She said she just likes to have an extra one on standby because the last 2 she has gotten said they had over 100 puffs remaining but were not working anymore.  She also said you were right about her Appendix, she had it removed last week. She said thank you so much!

## 2015-01-13 NOTE — Telephone Encounter (Signed)
If patient has already used one inhaler since Sept 7th, she needs an appointment I reviewed Dr. Donivan Scull note and there is a list of inhalers in her check-out instructions, but I don't see any more inhalers on her actual med list We need to get her lungs taken care of; appt soon please I'll approve one more inhaler, but ideally, she should not need to use it more than twice a week so one inhaler should last many months

## 2015-01-14 ENCOUNTER — Encounter: Payer: Self-pay | Admitting: General Surgery

## 2015-01-14 ENCOUNTER — Ambulatory Visit (INDEPENDENT_AMBULATORY_CARE_PROVIDER_SITE_OTHER): Payer: Medicare Other | Admitting: General Surgery

## 2015-01-14 VITALS — BP 130/74 | HR 74 | Resp 12 | Ht 66.0 in | Wt 209.0 lb

## 2015-01-14 DIAGNOSIS — K36 Other appendicitis: Secondary | ICD-10-CM

## 2015-01-14 NOTE — Patient Instructions (Addendum)
Call if any concerns arise. Activity as tolerated.

## 2015-01-14 NOTE — Progress Notes (Signed)
This is a 69 year old female here today for her post op appendectomy done on 01/07/15. Patient states she is doing well, feeling much better.  She has no pain in right lower abdomen now, only has pain and soreness over llq portsite I have reviewed the history of present illness with the patient. Portsites healing well with no signs of infection. Abdomen is soft, minimally tender only at left port incision.  Path- chronic appendicitis. Pt advised Increase activity as tolerated. Return as needed.

## 2015-02-03 ENCOUNTER — Other Ambulatory Visit: Payer: Self-pay | Admitting: Cardiovascular Disease

## 2015-02-15 ENCOUNTER — Other Ambulatory Visit: Payer: Self-pay | Admitting: Cardiovascular Disease

## 2015-02-15 DIAGNOSIS — L409 Psoriasis, unspecified: Secondary | ICD-10-CM | POA: Diagnosis not present

## 2015-02-15 DIAGNOSIS — L820 Inflamed seborrheic keratosis: Secondary | ICD-10-CM | POA: Diagnosis not present

## 2015-02-15 DIAGNOSIS — L259 Unspecified contact dermatitis, unspecified cause: Secondary | ICD-10-CM | POA: Diagnosis not present

## 2015-02-15 DIAGNOSIS — D485 Neoplasm of uncertain behavior of skin: Secondary | ICD-10-CM | POA: Diagnosis not present

## 2015-02-15 DIAGNOSIS — L821 Other seborrheic keratosis: Secondary | ICD-10-CM | POA: Diagnosis not present

## 2015-03-01 ENCOUNTER — Other Ambulatory Visit: Payer: Self-pay | Admitting: Family Medicine

## 2015-03-01 DIAGNOSIS — Z809 Family history of malignant neoplasm, unspecified: Secondary | ICD-10-CM | POA: Diagnosis not present

## 2015-03-01 DIAGNOSIS — K229 Disease of esophagus, unspecified: Secondary | ICD-10-CM | POA: Diagnosis not present

## 2015-03-01 DIAGNOSIS — E039 Hypothyroidism, unspecified: Secondary | ICD-10-CM | POA: Diagnosis not present

## 2015-03-01 DIAGNOSIS — K915 Postcholecystectomy syndrome: Secondary | ICD-10-CM | POA: Diagnosis not present

## 2015-03-01 DIAGNOSIS — E063 Autoimmune thyroiditis: Secondary | ICD-10-CM | POA: Diagnosis not present

## 2015-03-01 DIAGNOSIS — E890 Postprocedural hypothyroidism: Secondary | ICD-10-CM | POA: Diagnosis not present

## 2015-03-01 NOTE — Telephone Encounter (Signed)
Routing to provider  

## 2015-03-01 NOTE — Telephone Encounter (Signed)
Creatinine and K+ reviewed; rx approved

## 2015-03-02 DIAGNOSIS — N390 Urinary tract infection, site not specified: Secondary | ICD-10-CM | POA: Diagnosis not present

## 2015-03-09 DIAGNOSIS — K229 Disease of esophagus, unspecified: Secondary | ICD-10-CM | POA: Diagnosis not present

## 2015-03-09 DIAGNOSIS — E890 Postprocedural hypothyroidism: Secondary | ICD-10-CM | POA: Diagnosis not present

## 2015-03-09 DIAGNOSIS — E039 Hypothyroidism, unspecified: Secondary | ICD-10-CM | POA: Diagnosis not present

## 2015-03-09 DIAGNOSIS — E063 Autoimmune thyroiditis: Secondary | ICD-10-CM | POA: Diagnosis not present

## 2015-03-09 DIAGNOSIS — Z809 Family history of malignant neoplasm, unspecified: Secondary | ICD-10-CM | POA: Diagnosis not present

## 2015-03-09 DIAGNOSIS — K915 Postcholecystectomy syndrome: Secondary | ICD-10-CM | POA: Diagnosis not present

## 2015-03-23 DIAGNOSIS — H43812 Vitreous degeneration, left eye: Secondary | ICD-10-CM | POA: Diagnosis not present

## 2015-04-07 ENCOUNTER — Telehealth: Payer: Self-pay | Admitting: Family Medicine

## 2015-04-07 ENCOUNTER — Encounter: Payer: Self-pay | Admitting: Family Medicine

## 2015-04-07 ENCOUNTER — Ambulatory Visit (INDEPENDENT_AMBULATORY_CARE_PROVIDER_SITE_OTHER): Payer: Medicare Other | Admitting: Family Medicine

## 2015-04-07 ENCOUNTER — Other Ambulatory Visit: Payer: Self-pay | Admitting: Family Medicine

## 2015-04-07 VITALS — BP 116/74 | HR 70 | Temp 97.9°F | Wt 214.0 lb

## 2015-04-07 DIAGNOSIS — N898 Other specified noninflammatory disorders of vagina: Secondary | ICD-10-CM | POA: Diagnosis not present

## 2015-04-07 DIAGNOSIS — R140 Abdominal distension (gaseous): Secondary | ICD-10-CM

## 2015-04-07 DIAGNOSIS — R1031 Right lower quadrant pain: Secondary | ICD-10-CM

## 2015-04-07 DIAGNOSIS — K5900 Constipation, unspecified: Secondary | ICD-10-CM | POA: Insufficient documentation

## 2015-04-07 MED ORDER — LOSARTAN POTASSIUM 50 MG PO TABS: 50.0000 mg | ORAL_TABLET | Freq: Every day | ORAL | Status: DC

## 2015-04-07 MED ORDER — HYDROXYZINE HCL 10 MG PO TABS
10.0000 mg | ORAL_TABLET | Freq: Three times a day (TID) | ORAL | Status: DC | PRN
Start: 2015-04-07 — End: 2015-05-03

## 2015-04-07 MED ORDER — CLINDAMYCIN PHOSPHATE 2 % VA CREA: 1.0000 | TOPICAL_CREAM | Freq: Every day | VAGINAL | Status: DC

## 2015-04-07 NOTE — Telephone Encounter (Signed)
I entered a new order; please CANCEL the other with contrast; thank you

## 2015-04-07 NOTE — Telephone Encounter (Signed)
-----   Message from Sandria Manly, [place] sent at 04/07/2015  3:54 PM EST ----- I scheduled Tammy [name]'s CT scan for tomorrow. But she CAN NOT have the CT with contrast because she only has one good functioning kidney. The appointment will remain the same but I need you to add in an order so I can call imaging and get them to switch the orders. Thank you.

## 2015-04-07 NOTE — Telephone Encounter (Signed)
Please let patient know that she does in fact have that bacterial infection that I suspected on her exam; that is easily treated and I'll send Rx to pharmacy now Her CBC is completey normal, which is good news

## 2015-04-07 NOTE — Patient Instructions (Signed)
Return on Friday to go over the scan results We'll get labs today Let me know about any changes between now and then Try the new medicine for itching

## 2015-04-07 NOTE — Assessment & Plan Note (Signed)
CT

## 2015-04-07 NOTE — Telephone Encounter (Signed)
Patient notified

## 2015-04-07 NOTE — Assessment & Plan Note (Signed)
Check CT scan of abd and pelvis

## 2015-04-07 NOTE — Telephone Encounter (Signed)
rx sent

## 2015-04-07 NOTE — Assessment & Plan Note (Signed)
CT scan of abd and pelvis, with pruritis generalized, rule-out malignancy

## 2015-04-07 NOTE — Progress Notes (Signed)
BP 116/74 mmHg  Pulse 70  Temp(Src) 97.9 F (36.6 C)  Wt 214 lb (97.07 kg)  SpO2 94%   Subjective:    Patient ID: Tammy [name], female    DOB: March 08, 1946, 70 y.o.   MRN: [id number]  HPI: Tammy [name] is a 70 y.o. female  Chief Complaint  Patient presents with  . Abdominal Pain    It's been bothering her since shortly after surgery for her appendectomy. feels the same as before she had her appendix removed. last night stomach felt hard as a rock. She states food goes right thru her and having alot of bowel movements, 8-10 a day.  . orders    she needs orders for mammo and dexa  . Lab    she would like to be screened for Hep C   She is here for abdominal pain; had appendectomy October 26th, Dr. Jamal Collin; that other pain went away but only for a week, and then started to hurt again; feels like the same pain, but now having other symptoms too; she will hurt down in the lower abdomen and sometimes across the mid-abdomen; abdomen got really hard; felt like a rock, so hard last night; in her lower back over upper hip area hurts on the right; when she eats, she has to immediately go to the bathroom; went to the bathroom 8-10 times; regular BM, not loose; light in color; no blood and she is looking No change in her urine; had recurrent UTIs, was seeing Dr. Bernardo Heater (urologist); he did a cystoscopy, that was "fine" per patient; that was just a month ago, then she says it was right before appendectomy No fevers No weight loss, which is surprising b/c she has cut out sugar; feels bloated; has ovaries She has not contacted Dr. Jamal Collin about her symptoms  She says she has other things: she itches from her head to her toes; for the last two nights, her ankle has itched; really itchy; she has slept with gold bond anti-itch and cortisone cream which helps for a while; 5-6 days; she says that atarax helped daughter with her itching; the itching all over has been going on for weeks, ankle broke out 5-6 days  ago  She has terrible drainage from her nose, just clear; has a nasal spray that she uses sometimes; no fevers; sneezing, been doing that for weeks; pulling carpet and getting wood floors  Relevant past medical, surgical, family and social history reviewed and updated as indicated. Interim medical history since our last visit reviewed. Allergies and medications reviewed and updated.  Review of Systems  Per HPI unless specifically indicated above     Objective:    BP 116/74 mmHg  Pulse 70  Temp(Src) 97.9 F (36.6 C)  Wt 214 lb (97.07 kg)  SpO2 94%  Wt Readings from Last 3 Encounters:  04/09/15 214 lb (97.07 kg)  04/07/15 214 lb (97.07 kg)  01/14/15 209 lb (94.802 kg)    Physical Exam  Constitutional: She appears well-developed and well-nourished. No distress.  HENT:  Head: Normocephalic and atraumatic.  Eyes: EOM are normal. No scleral icterus.  Neck: No thyromegaly present.  Cardiovascular: Normal rate, regular rhythm and normal heart sounds.   No murmur heard. Pulmonary/Chest: Effort normal and breath sounds normal. No respiratory distress. She has no wheezes.  Abdominal: Soft. Bowel sounds are normal. She exhibits no distension.  Genitourinary: Right adnexum displays no mass and no fullness. Left adnexum displays no mass and no fullness. Vaginal discharge (  scant watery thin d/c, specimen collected for wet mount) found.  Musculoskeletal: Normal range of motion. She exhibits no edema.  Neurological: She is alert. She exhibits normal muscle tone.  Skin: Skin is warm and dry. She is not diaphoretic. No pallor.  Psychiatric: She has a normal mood and affect. Her behavior is normal. Judgment and thought content normal.   Results for orders placed or performed in visit on 04/07/15  Microscopic Examination  Result Value Ref Range   WBC, UA 0-5 0 -  5 /hpf   RBC, UA 0-2 0 -  2 /hpf   Epithelial Cells (non renal) 0-10 0 - 10 /hpf   Casts Present (A) None seen /lpf   Cast Type  Hyaline casts N/A   Bacteria, UA Few None seen/Few  WET PREP FOR TRICH, YEAST, CLUE  Result Value Ref Range   Trichomonas Exam Negative Negative   Yeast Exam Negative Negative   Clue Cell Exam Positive (A) Negative  CBC With Differential/Platelet  Result Value Ref Range   WBC 9.6 3.4 - 10.8 x10E3/uL   RBC 4.41 3.77 - 5.28 x10E6/uL   Hemoglobin 13.8 11.1 - 15.9 g/dL   Hematocrit 40.7 34.0 - 46.6 %   MCV 92 79 - 97 fL   MCH 31.3 26.6 - 33.0 pg   MCHC 33.9 31.5 - 35.7 g/dL   RDW 13.3 12.3 - 15.4 %   Platelets 275 150 - 379 x10E3/uL   Neutrophils 58 %   Lymphs 31 %   MID 12 %   Neutrophils Absolute 5.6 1.4 - 7.0 x10E3/uL   Lymphocytes Absolute 2.9 0.7 - 3.1 x10E3/uL   MID (Absolute) 1.1 0.1 - 1.6 X10E3/uL  Comprehensive metabolic panel  Result Value Ref Range   Glucose 250 (H) 65 - 99 mg/dL   BUN 18 8 - 27 mg/dL   Creatinine, Ser 1.33 (H) 0.57 - 1.00 mg/dL   GFR calc non Af Amer 40 (L) >59 mL/min/1.73   GFR calc Af Amer 46 (L) >59 mL/min/1.73   BUN/Creatinine Ratio 14 11 - 26   Sodium 139 134 - 144 mmol/L   Potassium 5.4 (H) 3.5 - 5.2 mmol/L   Chloride 95 (L) 96 - 106 mmol/L   CO2 27 18 - 29 mmol/L   Calcium 10.2 8.7 - 10.3 mg/dL   Total Protein 6.7 6.0 - 8.5 g/dL   Albumin 4.4 3.5 - 4.8 g/dL   Globulin, Total 2.3 1.5 - 4.5 g/dL   Albumin/Globulin Ratio 1.9 1.1 - 2.5   Bilirubin Total 0.6 0.0 - 1.2 mg/dL   Alkaline Phosphatase 84 39 - 117 IU/L   AST 19 0 - 40 IU/L   ALT 16 0 - 32 IU/L  UA/M w/rflx Culture, Routine  Result Value Ref Range   Specific Gravity, UA 1.015 1.005 - 1.030   pH, UA 6.0 5.0 - 7.5   Color, UA Yellow Yellow   Appearance Ur Cloudy (A) Clear   Leukocytes, UA 1+ (A) Negative   Protein, UA Negative Negative/Trace   Glucose, UA Negative Negative   Ketones, UA Negative Negative   RBC, UA Negative Negative   Bilirubin, UA Negative Negative   Urobilinogen, Ur 0.2 0.2 - 1.0 mg/dL   Nitrite, UA Negative Negative   Microscopic Examination See below:     Urinalysis Reflex Comment   Urine Culture, Routine  Result Value Ref Range   Urine Culture, Routine Final report    Urine Culture result 1 Comment  Assessment & Plan:   Problem List Items Addressed This Visit      Other   Right lower quadrant abdominal pain - Primary    Check CT scan of abd and pelvis      Relevant Orders   CT Abdomen Pelvis W Contrast   CBC With Differential/Platelet (Completed)   Comprehensive metabolic panel (Completed)   UA/M w/rflx Culture, Routine (Completed)   Abdominal bloating    CT scan of abd and pelvis, with pruritis generalized, rule-out malignancy      Relevant Orders   CT Abdomen Pelvis W Contrast   Vaginal discharge   Relevant Orders   WET PREP FOR [place], YEAST, CLUE      Follow up plan: Return in about 2 days (around 04/09/2015) for follow-up.  An after-visit summary was printed and given to the patient at [place].  Please see the patient instructions which may contain other information and recommendations beyond what is mentioned above in the assessment and plan.  Face-to-face time with patient was more than 25 minutes, >50% time spent counseling and coordination of care

## 2015-04-08 ENCOUNTER — Ambulatory Visit
Admission: RE | Admit: 2015-04-08 | Discharge: 2015-04-08 | Disposition: A | Discharge status: Home / Self Care | Payer: Medicare Other | Source: Ambulatory Visit | Attending: Family Medicine | Admitting: Family Medicine

## 2015-04-08 DIAGNOSIS — I77811 Abdominal aortic ectasia: Secondary | ICD-10-CM | POA: Insufficient documentation

## 2015-04-08 DIAGNOSIS — K760 Fatty (change of) liver, not elsewhere classified: Secondary | ICD-10-CM | POA: Diagnosis not present

## 2015-04-08 DIAGNOSIS — N2889 Other specified disorders of kidney and ureter: Secondary | ICD-10-CM | POA: Insufficient documentation

## 2015-04-08 DIAGNOSIS — K5790 Diverticulosis of intestine, part unspecified, without perforation or abscess without bleeding: Secondary | ICD-10-CM | POA: Diagnosis not present

## 2015-04-08 DIAGNOSIS — N1330 Unspecified hydronephrosis: Secondary | ICD-10-CM | POA: Diagnosis not present

## 2015-04-08 DIAGNOSIS — R109 Unspecified abdominal pain: Secondary | ICD-10-CM | POA: Diagnosis not present

## 2015-04-08 DIAGNOSIS — R1031 Right lower quadrant pain: Secondary | ICD-10-CM | POA: Diagnosis not present

## 2015-04-08 DIAGNOSIS — R140 Abdominal distension (gaseous): Secondary | ICD-10-CM | POA: Diagnosis not present

## 2015-04-08 LAB — COMPREHENSIVE METABOLIC PANEL
ALT: 16 IU/L (ref 0–32)
AST: 19 IU/L (ref 0–40)
Albumin/Globulin Ratio: 1.9 (ref 1.1–2.5)
Albumin: 4.4 g/dL (ref 3.5–4.8)
Alkaline Phosphatase: 84 IU/L (ref 39–117)
BUN/Creatinine Ratio: 14 (ref 11–26)
BUN: 18 mg/dL (ref 8–27)
Bilirubin Total: 0.6 mg/dL (ref 0.0–1.2)
CO2: 27 mmol/L (ref 18–29)
Calcium: 10.2 mg/dL (ref 8.7–10.3)
Chloride: 95 mmol/L — ABNORMAL LOW (ref 96–106)
Creatinine, Ser: 1.33 mg/dL — ABNORMAL HIGH (ref 0.57–1.00)
GFR calc Af Amer: 46 mL/min/{1.73_m2} — ABNORMAL LOW (ref >59)
GFR calc non Af Amer: 40 mL/min/{1.73_m2} — ABNORMAL LOW (ref >59)
Globulin, Total: 2.3 g/dL (ref 1.5–4.5)
Glucose: 250 mg/dL — ABNORMAL HIGH (ref 65–99)
Potassium: 5.4 mmol/L — ABNORMAL HIGH (ref 3.5–5.2)
Sodium: 139 mmol/L (ref 134–144)
Total Protein: 6.7 g/dL (ref 6.0–8.5)

## 2015-04-08 NOTE — Telephone Encounter (Signed)
Order switched. CT scan W/O contrast is still scheduled for today.

## 2015-04-09 ENCOUNTER — Ambulatory Visit (INDEPENDENT_AMBULATORY_CARE_PROVIDER_SITE_OTHER): Payer: Medicare Other | Admitting: Family Medicine

## 2015-04-09 ENCOUNTER — Encounter: Payer: Self-pay | Admitting: Family Medicine

## 2015-04-09 VITALS — BP 134/76 | HR 69 | Temp 97.5°F | Wt 214.0 lb

## 2015-04-09 DIAGNOSIS — K760 Fatty (change of) liver, not elsewhere classified: Secondary | ICD-10-CM

## 2015-04-09 DIAGNOSIS — N189 Chronic kidney disease, unspecified: Secondary | ICD-10-CM | POA: Diagnosis not present

## 2015-04-09 DIAGNOSIS — I77811 Abdominal aortic ectasia: Secondary | ICD-10-CM | POA: Diagnosis not present

## 2015-04-09 DIAGNOSIS — I700 Atherosclerosis of aorta: Secondary | ICD-10-CM

## 2015-04-09 DIAGNOSIS — E118 Type 2 diabetes mellitus with unspecified complications: Secondary | ICD-10-CM

## 2015-04-09 DIAGNOSIS — N1330 Unspecified hydronephrosis: Secondary | ICD-10-CM | POA: Diagnosis not present

## 2015-04-09 DIAGNOSIS — IMO0002 Reserved for concepts with insufficient information to code with codable children: Secondary | ICD-10-CM

## 2015-04-09 DIAGNOSIS — R1031 Right lower quadrant pain: Secondary | ICD-10-CM

## 2015-04-09 DIAGNOSIS — K5730 Diverticulosis of large intestine without perforation or abscess without bleeding: Secondary | ICD-10-CM | POA: Diagnosis not present

## 2015-04-09 DIAGNOSIS — N183 Chronic kidney disease, stage 3 unspecified: Secondary | ICD-10-CM

## 2015-04-09 DIAGNOSIS — J9811 Atelectasis: Secondary | ICD-10-CM | POA: Diagnosis not present

## 2015-04-09 DIAGNOSIS — R1032 Left lower quadrant pain: Secondary | ICD-10-CM | POA: Diagnosis not present

## 2015-04-09 DIAGNOSIS — E1165 Type 2 diabetes mellitus with hyperglycemia: Secondary | ICD-10-CM | POA: Diagnosis not present

## 2015-04-09 LAB — CBC WITH DIFFERENTIAL/PLATELET
Hematocrit: 40.7 % (ref 34.0–46.6)
Hemoglobin: 13.8 g/dL (ref 11.1–15.9)
Lymphocytes Absolute: 2.9 10*3/uL (ref 0.7–3.1)
Lymphs: 31 %
MCH: 31.3 pg (ref 26.6–33.0)
MCHC: 33.9 g/dL (ref 31.5–35.7)
MCV: 92 fL (ref 79–97)
MID (Absolute): 1.1 10*3/uL (ref 0.1–1.6)
MID: 12 %
Neutrophils Absolute: 5.6 10*3/uL (ref 1.4–7.0)
Neutrophils: 58 %
Platelets: 275 10*3/uL (ref 150–379)
RBC: 4.41 x10E6/uL (ref 3.77–5.28)
RDW: 13.3 % (ref 12.3–15.4)
WBC: 9.6 10*3/uL (ref 3.4–10.8)

## 2015-04-09 LAB — UA/M W/RFLX CULTURE, ROUTINE
Bilirubin, UA: NEGATIVE
Glucose, UA: NEGATIVE
Ketones, UA: NEGATIVE
Nitrite, UA: NEGATIVE
Protein, UA: NEGATIVE
RBC, UA: NEGATIVE
Specific Gravity, UA: 1.015 (ref 1.005–1.030)
Urobilinogen, Ur: 0.2 mg/dL (ref 0.2–1.0)
pH, UA: 6 (ref 5.0–7.5)

## 2015-04-09 LAB — URINE CULTURE, REFLEX

## 2015-04-09 LAB — MICROSCOPIC EXAMINATION

## 2015-04-09 LAB — WET PREP FOR TRICH, YEAST, CLUE
Clue Cell Exam: POSITIVE — AB
Trichomonas Exam: NEGATIVE
Yeast Exam: NEGATIVE

## 2015-04-09 NOTE — Progress Notes (Signed)
BP 134/76 mmHg  Pulse 69  Temp(Src) 97.5 F (36.4 C)  Wt 214 lb (97.07 kg)  SpO2 97%   Subjective:    Patient ID: Tammy [name], female    DOB: 04/15/1945, 70 y.o.   MRN: [id number]  HPI: Tammy [name] is a 70 y.o. female  Chief Complaint  Patient presents with  . Abdominal Pain    f/u RLQ abd pain and CT results   We discussed her CT scan in detail; husband here as well for the visit; seen a few days ago, CT scan ordered  CLINICAL DATA: Chronic abdominal pain. Status post appendectomy 01/06/2015 with relief of abdominal pain for 1 week. Subsequent encounter.  EXAM: CT ABDOMEN AND PELVIS WITHOUT CONTRAST  TECHNIQUE: Multidetector CT imaging of the abdomen and pelvis was performed following the standard protocol without IV contrast.  COMPARISON: CT abdomen and pelvis 12/17/2014.  FINDINGS: There is some dependent atelectasis in the lung bases. No pleural or pericardial effusion.  The liver is diffusely low attenuating consistent with fatty infiltration. No focal liver lesion is seen. The gallbladder has been removed. The adrenal glands, spleen and pancreas appear normal. Marked right hydronephrosis with cortical atrophy and scarring appears unchanged. No stone or obstructing lesion is identified. The appearance is unchanged. The left kidney is unremarkable.  Uterus, adnexa and urinary bladder appear normal. Diverticulosis appears worst in the sigmoid without evidence of diverticulitis. The colon is otherwise unremarkable. The appendix has been removed. Aortoiliac atherosclerosis and ectasia of the abdominal aorta at 2.9 cm are unchanged. There is no lymphadenopathy or fluid.  The patient is status post L5-S1 fusion. Loss of disc space height is most notable at L2-3 and L3-4. There is trace retrolisthesis at each of these levels. No lytic or sclerotic bony lesion is identified.  IMPRESSION: No acute abnormality or finding to explain the patient's  symptoms.  Diffuse fatty infiltration of the liver.  Diverticulosis without diverticulitis.  Chronic right hydronephrosis may be due to UPJ obstruction. Associated scarring and atrophy of the right kidney noted.  Ectasia of the abdominal aorta at 2.9 cm, unchanged.   Electronically Signed  By: Inge Rise M.D.  On: 04/08/2015 16:25  She still has pain in the RLQ; had to stool rather urgently after drinking the contrast yesterday; she was at home, but couldn't make it to toilet; saw Dr. Jamal Collin for the appendectomy in October 2016; goes out to eat and has to find a bathroom before she gets home; she says this has been since BEFORE her gallbladder was taken out  She does drink skim milk; does eat some cheese, not every day; does have hamburgers, but more chicken if out to eat Already on simvastatin and zetia for cholesterol  Renal insufficiency; did see nephrologist in [place] but did not return  She is trying to not eat sweets; she knew her sugars were up; drinks NO soft drinks; does drink sweet tea but she could switch to unsweetened tea no problems  Son has type 1 diabetes, three brothers are diabetic; brother will checks sugar, less than 200 at home, 175 after eating; reviewed sugars  Exercise limited; cannot stand for any length of time; can't walk; back hurts; does not want another fusion  Relevant past medical, surgical, family and social history reviewed and updated as indicated. Interim medical history since our last visit reviewed. Allergies and medications reviewed and updated.  Review of Systems  Per HPI unless specifically indicated above     Objective:  BP 134/76 mmHg  Pulse 69  Temp(Src) 97.5 F (36.4 C)  Wt 214 lb (97.07 kg)  SpO2 97%  Wt Readings from Last 3 Encounters:  04/09/15 214 lb (97.07 kg)  04/07/15 214 lb (97.07 kg)  01/14/15 209 lb (94.802 kg)    Physical Exam  Constitutional: She appears well-developed and well-nourished.  No distress.  Obese, weight stable  Eyes: EOM are normal. No scleral icterus.  Neck: No thyromegaly present.  Cardiovascular: Normal rate and regular rhythm.   Pulmonary/Chest: Effort normal and breath sounds normal.  Abdominal: She exhibits no distension.  Skin: No pallor.  Psychiatric: She has a normal mood and affect. Her behavior is normal. Judgment and thought content normal.   Results for orders placed or performed in visit on 04/07/15  Microscopic Examination  Result Value Ref Range   WBC, UA 0-5 0 -  5 /hpf   RBC, UA 0-2 0 -  2 /hpf   Epithelial Cells (non renal) 0-10 0 - 10 /hpf   Casts Present (A) None seen /lpf   Cast Type Hyaline casts N/A   Bacteria, UA Few None seen/Few  WET PREP FOR TRICH, YEAST, CLUE  Result Value Ref Range   Trichomonas Exam Negative Negative   Yeast Exam Negative Negative   Clue Cell Exam Positive (A) Negative  CBC With Differential/Platelet  Result Value Ref Range   WBC 9.6 3.4 - 10.8 x10E3/uL   RBC 4.41 3.77 - 5.28 x10E6/uL   Hemoglobin 13.8 11.1 - 15.9 g/dL   Hematocrit 40.7 34.0 - 46.6 %   MCV 92 79 - 97 fL   MCH 31.3 26.6 - 33.0 pg   MCHC 33.9 31.5 - 35.7 g/dL   RDW 13.3 12.3 - 15.4 %   Platelets 275 150 - 379 x10E3/uL   Neutrophils 58 %   Lymphs 31 %   MID 12 %   Neutrophils Absolute 5.6 1.4 - 7.0 x10E3/uL   Lymphocytes Absolute 2.9 0.7 - 3.1 x10E3/uL   MID (Absolute) 1.1 0.1 - 1.6 X10E3/uL  Comprehensive metabolic panel  Result Value Ref Range   Glucose 250 (H) 65 - 99 mg/dL   BUN 18 8 - 27 mg/dL   Creatinine, Ser 1.33 (H) 0.57 - 1.00 mg/dL   GFR calc non Af Amer 40 (L) >59 mL/min/1.73   GFR calc Af Amer 46 (L) >59 mL/min/1.73   BUN/Creatinine Ratio 14 11 - 26   Sodium 139 134 - 144 mmol/L   Potassium 5.4 (H) 3.5 - 5.2 mmol/L   Chloride 95 (L) 96 - 106 mmol/L   CO2 27 18 - 29 mmol/L   Calcium 10.2 8.7 - 10.3 mg/dL   Total Protein 6.7 6.0 - 8.5 g/dL   Albumin 4.4 3.5 - 4.8 g/dL   Globulin, Total 2.3 1.5 - 4.5 g/dL    Albumin/Globulin Ratio 1.9 1.1 - 2.5   Bilirubin Total 0.6 0.0 - 1.2 mg/dL   Alkaline Phosphatase 84 39 - 117 IU/L   AST 19 0 - 40 IU/L   ALT 16 0 - 32 IU/L  UA/M w/rflx Culture, Routine  Result Value Ref Range   Specific Gravity, UA 1.015 1.005 - 1.030   pH, UA 6.0 5.0 - 7.5   Color, UA Yellow Yellow   Appearance Ur Cloudy (A) Clear   Leukocytes, UA 1+ (A) Negative   Protein, UA Negative Negative/Trace   Glucose, UA Negative Negative   Ketones, UA Negative Negative   RBC, UA Negative Negative   Bilirubin,  UA Negative Negative   Urobilinogen, Ur 0.2 0.2 - 1.0 mg/dL   Nitrite, UA Negative Negative   Microscopic Examination See below:    Urinalysis Reflex Comment   Urine Culture, Routine  Result Value Ref Range   Urine Culture, Routine Final report    Urine Culture result 1 Comment       Assessment & Plan:   Problem List Items Addressed This Visit      Cardiovascular and Mediastinum   Abdominal aortic atherosclerosis ([place])    Discussed with patient and husband; weight loss, healthier low-fat diet encouraged      Abdominal aortic ectasia ([facility])    Due for rescan fall of 2021        Respiratory   Bilateral atelectasis    Noted on CT scan; discussed with patient and husband; encouraged good posture, occasional deep breath to fill lungs        Digestive   Diverticulosis of colon    Noted on scans; discussed today; avoid seeds      Relevant Orders   Ambulatory referral to Gastroenterology   Fatty liver disease, nonalcoholic    Noted on CT scan Jan 2017; discussed dx; encouraged weight loss, lower fat diet        Endocrine   Diabetes mellitus type 2 with complications, uncontrolled ([place])    Start Tradjenta; explained diagnosis; refer to diabetic educator      Relevant Medications   linagliptin (TRADJENTA) 5 MG TABS tablet   Other Relevant Orders   Basic metabolic panel   Hgb K9T w/o eAG   Ambulatory referral to diabetic education     Genitourinary    Chronic renal insufficiency, stage III (moderate)    Refer to nephrologist      Relevant Orders   Ambulatory referral to Nephrology   Hydronephrosis of right kidney     Other   Bilateral lower abdominal pain - Primary    Refer to GI, consider colonoscopy, may also have IBS; given low FODMAP diet      Relevant Orders   Ambulatory referral to Gastroenterology      Follow up plan: Return in about 6 weeks (around 05/21/2015).  Meds ordered this encounter  Medications  . linagliptin (TRADJENTA) 5 MG TABS tablet    Sig: Take 1 tablet (5 mg total) by mouth daily.    Dispense:  30 tablet    Refill:  1   An after-visit summary was printed and given to the patient at [place].  Please see the patient instructions which may contain other information and recommendations beyond what is mentioned above in the assessment and plan.  Face-to-face time with patient was more than 40 minutes, >50% time spent counseling and coordination of care   Cc: Dr. Bernardo Heater Please call me Monday or Tuesday about her sx, could urologic problems be cause of her sx, your thoughts? Thank you, Dr. Sanda Klein

## 2015-04-09 NOTE — Assessment & Plan Note (Signed)
Refer to GI, consider colonoscopy, may also have IBS; given low FODMAP diet

## 2015-04-09 NOTE — Assessment & Plan Note (Signed)
Refer to nephrologist 

## 2015-04-09 NOTE — Patient Instructions (Addendum)
Try to limit saturated fats in your diet (bologna, hot dogs, barbeque, cheeseburgers, hamburgers, steak, bacon, sausage, cheese, etc.) and get more fresh fruits, vegetables, and whole grains Limit sweets and simple carbs Try the low FODMAP diet We'll have you see the nephrologist to keep kidney function working We'll have you see the gastroenterologist about your bowels Avoid NSAIDs  Start Tradjenta for sugars Return on Thursday for fasting labs (BMP, A1c) Decrease your vitamin D tablets to just TWICE a WEEK

## 2015-04-09 NOTE — Assessment & Plan Note (Signed)
Noted on scans; discussed today; avoid seeds

## 2015-04-09 NOTE — Assessment & Plan Note (Signed)
Start Tradjenta; explained diagnosis; refer to diabetic educator

## 2015-04-11 DIAGNOSIS — N1330 Unspecified hydronephrosis: Secondary | ICD-10-CM | POA: Insufficient documentation

## 2015-04-11 DIAGNOSIS — I77811 Abdominal aortic ectasia: Secondary | ICD-10-CM | POA: Insufficient documentation

## 2015-04-11 DIAGNOSIS — J9811 Atelectasis: Secondary | ICD-10-CM | POA: Insufficient documentation

## 2015-04-11 DIAGNOSIS — K760 Fatty (change of) liver, not elsewhere classified: Secondary | ICD-10-CM | POA: Insufficient documentation

## 2015-04-11 MED ORDER — LINAGLIPTIN 5 MG PO TABS: 5.0000 mg | ORAL_TABLET | Freq: Every day | ORAL | Status: DC

## 2015-04-11 NOTE — Assessment & Plan Note (Signed)
Discussed with patient and husband; weight loss, healthier low-fat diet encouraged

## 2015-04-11 NOTE — Assessment & Plan Note (Signed)
Due for rescan fall of 2021

## 2015-04-11 NOTE — Assessment & Plan Note (Signed)
Noted on CT scan; discussed with patient and husband; encouraged good posture, occasional deep breath to fill lungs

## 2015-04-11 NOTE — Assessment & Plan Note (Signed)
Noted on CT scan Jan 2017; discussed dx; encouraged weight loss, lower fat diet

## 2015-04-14 ENCOUNTER — Telehealth: Payer: Self-pay | Admitting: Family Medicine

## 2015-04-14 NOTE — Telephone Encounter (Signed)
-----  Message from Sandria Manly, [place] sent at 04/14/2015  8:57 AM EST ----- Patient's prior authorization for tradjenta was denied.  Reason: Medical Criteria not met. Patient must try the formularies on their list first before approval.

## 2015-04-14 NOTE — Telephone Encounter (Signed)
Glimepiride Glipizide--ER & XL Glyburide Glyburide/Metformin Metformin Metformin ER Pioglitazone.   All these drugs are tier 1. They want them to try a few of these first and fail before going into a tier 2 drug (tradjents, trulicity, tanzeum, victoza).

## 2015-04-14 NOTE — Telephone Encounter (Signed)
Please find out what is on her formulary; then back to me; thank you

## 2015-04-19 DIAGNOSIS — E785 Hyperlipidemia, unspecified: Secondary | ICD-10-CM | POA: Diagnosis not present

## 2015-04-19 DIAGNOSIS — N183 Chronic kidney disease, stage 3 (moderate): Secondary | ICD-10-CM | POA: Diagnosis not present

## 2015-04-19 DIAGNOSIS — I129 Hypertensive chronic kidney disease with stage 1 through stage 4 chronic kidney disease, or unspecified chronic kidney disease: Secondary | ICD-10-CM | POA: Diagnosis not present

## 2015-04-19 DIAGNOSIS — E1122 Type 2 diabetes mellitus with diabetic chronic kidney disease: Secondary | ICD-10-CM | POA: Diagnosis not present

## 2015-04-20 NOTE — Telephone Encounter (Signed)
I spoke with patient about medicines She saw the kidney doctor, Dr. Juleen China They did approve the Tradjenta, but it's over $200 per month We'll try to find something else; I'll call Dr. Juleen China tomorrow and see what he thinks She would like a glucometer, [facility]; she doesn't care which kind I'll send that and Rx tomorrow after I talk to Dr. Raliegh Ip

## 2015-04-22 NOTE — Telephone Encounter (Signed)
I called and left message for Dr. Juleen China

## 2015-04-23 MED ORDER — BLOOD GLUCOSE MONITOR KIT: PACK | Status: DC

## 2015-04-23 MED ORDER — GLIPIZIDE 5 MG PO TABS: 5.0000 mg | ORAL_TABLET | Freq: Every day | ORAL | Status: DC

## 2015-04-23 MED ORDER — GLIPIZIDE 5 MG PO TABS: 2.5000 mg | ORAL_TABLET | Freq: Every day | ORAL | Status: DC

## 2015-04-23 NOTE — Telephone Encounter (Signed)
I sent the first Rx for 5 mg, then changed my mind and sent in new Rx for just 2.5 mg daily; I called pharmacy to prevent them dispensing the first one

## 2015-04-23 NOTE — Telephone Encounter (Signed)
I talked with Dr. Juleen China earlier today; he said plain glipizide would be his choice; not XL Will send in; I thanked him for calling me back

## 2015-05-03 ENCOUNTER — Other Ambulatory Visit: Payer: Self-pay | Admitting: Family Medicine

## 2015-05-04 DIAGNOSIS — H43812 Vitreous degeneration, left eye: Secondary | ICD-10-CM | POA: Diagnosis not present

## 2015-05-04 NOTE — Telephone Encounter (Signed)
rx sent

## 2015-05-17 ENCOUNTER — Other Ambulatory Visit: Payer: Self-pay | Admitting: Family Medicine

## 2015-05-17 ENCOUNTER — Other Ambulatory Visit: Payer: Self-pay

## 2015-05-17 ENCOUNTER — Ambulatory Visit (INDEPENDENT_AMBULATORY_CARE_PROVIDER_SITE_OTHER): Payer: Medicare Other | Admitting: Gastroenterology

## 2015-05-17 ENCOUNTER — Encounter: Payer: Self-pay | Admitting: Gastroenterology

## 2015-05-17 VITALS — BP 146/96 | HR 76 | Temp 97.8°F | Ht 66.0 in | Wt 214.0 lb

## 2015-05-17 DIAGNOSIS — G8929 Other chronic pain: Secondary | ICD-10-CM | POA: Diagnosis not present

## 2015-05-17 DIAGNOSIS — R1031 Right lower quadrant pain: Secondary | ICD-10-CM

## 2015-05-17 DIAGNOSIS — R197 Diarrhea, unspecified: Secondary | ICD-10-CM

## 2015-05-17 MED ORDER — DICYCLOMINE HCL 20 MG PO TABS: 20.0000 mg | ORAL_TABLET | Freq: Three times a day (TID) | ORAL | Status: DC

## 2015-05-17 NOTE — Telephone Encounter (Signed)
approved

## 2015-05-17 NOTE — Progress Notes (Signed)
Gastroenterology Consultation  Referring Provider:     Arnetha Courser, MD Primary Care Physician:  Enid Derry, MD Primary Gastroenterologist:  Dr. Allen Norris     Reason for Consultation:     Diarrhea and abdominal pain        HPI:   Tammy [name] is a 70 y.o. y/o female referred for consultation & management of  Diarrhea and abdominal pain by Dr. Enid Derry, MD.  This patient comes today with a history of diarrhea that happens after every meal. The patient states that she also has lower abdominal pain. There is no report of any unexplained weight loss. She also denies any rectal bleeding. Her abdominal pain is in the lower abdomen on both sides but more on her right. She states that it is the same pain that she had before having her appendix removed. The abdominal pain occurs even when she is sleeping. The diarrhea does not wake her up from a sleep but he states that she has urgency after eating and will have an accident if she does not get to the bathroom in time. She also reports that she has back pain on the right side. She denies the pain being any worse with eating or drinking. When she gets the diarrhea after eat she states that the diarrhea can last a few hours.  Past Medical History  Diagnosis Date  . Coronary artery disease     a. 11/2011 Cath: LAD 38md, LCX min irregs, RCA 70p, 1077mith L->R collats, EF 60%-->Med Rx.  . [place]VD [place]peripheral [place] [place]    a. PTA of right leg and left femoral artery with stenosis.  . [place] KitchenOPD (chronic obstructive pulmonary disease) ([facility]  . CKD (chronic kidney disease), stage III   . Chronic diastolic CHF (congestive heart failure) [place] [place]    a. 07/2012 Echo: Nl EF. mild inferoseptal HK, Gr 2 DD, mild conc LVH, mild PR/TR, mild PAH.  . Stroke ([facility]  . Palpitations   . Benign neoplasm of colon   . GERD (gastroesophageal reflux disease)   . Hypothyroidism   . History of tobacco abuse     a. Quit 2011.  . PE (pulmonary embolism)   .  Hyperlipidemia   . Hypertension   . Obesity   . History of DVT (deep vein thrombosis)   . PONV (postoperative nausea and vomiting)     Past Surgical History  Procedure Laterality Date  . Angioplasty / stenting femoral      PVD; angioplasty right leg and left femoral artery with stenosis.   . Trigger finger release    . Thyroidectomy    . Cholecystectomy    . Cardiac catheterization  12/07/2011    Mid LAD 50%, distal LAD 50%, mid RCA 70%, distal RCA 100%   . Cardiac catheterization  01/04/2011    100% occluded mid RCA with good collaterals from distal LAD, normal LVEF.  . Back surgery  03/2012  . Colonoscopy  2013  . Laparoscopic appendectomy N/A 01/06/2015    Procedure: APPENDECTOMY LAPAROSCOPIC;  Surgeon: SeChristene LyeMD;  Location: [facility] ORS;  Service: General;  Laterality: N/A;  . Appendectomy  Oct. 2016    Prior to Admission medications   Medication Sig Start Date End Date Taking? Authorizing Provider  blood glucose meter kit and supplies KIT Dispense based on patient and insurance preference. Use one time daily as directed, E11.9 04/23/15  Yes MeArnetha CourserMD  Cholecalciferol 10000 UNITS  CAPS Take 1 capsule by mouth 2 (two) times a week.   Yes Historical Provider, MD  ELIQUIS 5 MG TABS tablet TAKE ONE TABLET BY MOUTH TWICE DAILY 02/15/15  Yes [name], MD  fluocinonide (LIDEX) 0.05 % external solution Apply twice a day to affected areas of scalp 02/15/15  Yes Historical Provider, MD  gabapentin (NEURONTIN) 300 MG capsule TAKE 1 CAPSULE 3 TIMES DAILY 04/07/15  Yes Arnetha Courser, MD  glipiZIDE (GLUCOTROL) 5 MG tablet Take 0.5 tablets (2.5 mg total) by mouth daily before breakfast. 04/23/15  Yes Arnetha Courser, MD  hydrOXYzine (ATARAX/VISTARIL) 10 MG tablet TAKE 1 TABLET BY MOUTH 3 TIMES DAILY AS NEEDED FOR ITCHING 05/04/15  Yes Arnetha Courser, MD  isosorbide mononitrate (IMDUR) 60 MG 24 hr tablet TAKE TWO TABLETS EVERY DAY Patient taking differently: TAKE 1.5  TABLETS EVERY DAY 02/03/15  Yes [name], MD  losartan (COZAAR) 50 MG tablet Take 1 tablet (50 mg total) by mouth daily. 04/07/15  Yes Arnetha Courser, MD  Magnesium 400 MG TABS Take 400 mg by mouth daily.   Yes Historical Provider, MD  metoprolol succinate (TOPROL-XL) 50 MG 24 hr tablet TAKE ONE TABLET EVERY DAY 12/28/14  Yes [name], MD  mirabegron ER (MYRBETRIQ) 25 MG TB24 tablet Take 25 mg by mouth daily.   Yes Historical Provider, MD  pantoprazole (PROTONIX) 40 MG tablet Take 40 mg by mouth at bedtime.    Yes Historical Provider, MD  PROAIR HFA 108 (90 Base) MCG/ACT inhaler INHALE 2 PUFFS EVERY 4 HOURS AS NEEDED FOR WHEEZING OR SHORTNESS OF BREATH 05/17/15  Yes Arnetha Courser, MD  simvastatin (ZOCOR) 40 MG tablet TAKE ONE TABLET AT BEDTIME 10/13/14  Yes [name], MD  SYNTHROID 112 MCG tablet Take 112 mcg by mouth daily before breakfast.  04/08/13  Yes Historical Provider, MD  vitamin B-12 (CYANOCOBALAMIN) 500 MCG tablet Take 500 mcg by mouth daily.   Yes Historical Provider, MD  ZETIA 10 MG tablet TAKE ONE TABLET BY MOUTH EVERY DAY 01/11/15  Yes [name], MD  clindamycin (CLEOCIN) 2 % vaginal cream Place 1 Applicatorful vaginally at bedtime. Patient not taking: Reported on 05/17/2015 04/07/15   Arnetha Courser, MD  dicyclomine (BENTYL) 20 MG tablet Take 1 tablet (20 mg total) by mouth 3 (three) times daily before meals. 05/17/15   [name], MD  fluticasone Asencion Islam) 50 MCG/ACT nasal spray Reported on 05/17/2015 11/27/14   Historical Provider, MD  linagliptin (TRADJENTA) 5 MG TABS tablet Take 1 tablet (5 mg total) by mouth daily. Patient not taking: Reported on 05/17/2015 04/11/15   Arnetha Courser, MD  nitroGLYCERIN (NITROSTAT) 0.4 MG SL tablet Place 1 tablet (0.4 mg total) under the tongue every 5 (five) minutes as needed for chest pain. Patient not taking: Reported on 05/17/2015 05/12/14   [name], MD    Family History  Problem Relation Age of Onset  . Hypertension  Father   . Heart disease Father   . Heart attack Father   . Diabetes Father   . Heart attack Brother   . Diabetes Brother   . Hypertension Brother   . Heart disease Brother   . Heart attack Brother   . Diabetes Brother   . Hypertension Brother   . Heart disease Brother   . Heart attack Brother   . Diabetes Brother   . Hypertension Brother   . Heart disease Brother   . Cancer  Mother     colon  . Cancer Sister     lung  . COPD Sister   . COPD Sister      Social History  Substance Use Topics  . Smoking status: Former Smoker -- 0.50 packs/day for 15 years    Types: Cigarettes    Quit date: 03/13/2009  . Smokeless tobacco: Never Used  . Alcohol Use: No    Allergies as of 05/17/2015 - Review Complete 05/17/2015  Allergen Reaction Noted  . Aleve [naproxen sodium]  06/13/2013  . Oxycodone Other (See Comments) 01/15/2014  . Ranexa [ranolazine]  06/13/2013  . Tramadol Other (See Comments) 01/15/2014    Review of Systems:    All systems reviewed and negative except where noted in HPI.   Physical Exam:  BP 146/96 mmHg  Pulse 76  Temp(Src) 97.8 F (36.6 C) (Oral)  Ht 5' 6" (1.676 m)  Wt 214 lb (97.07 kg)  BMI 34.56 kg/m2 No LMP recorded. Patient is postmenopausal. Psych:  Alert and cooperative. Normal mood and affect. General:   Alert,  Well-developed, well-nourished, pleasant and cooperative in NAD Head:  Normocephalic and atraumatic. Eyes:  Sclera clear, no icterus.   Conjunctiva pink. Ears:  Normal auditory acuity. Nose:  No deformity, discharge, or lesions. Mouth:  No deformity or lesions,oropharynx pink & moist. Neck:  Supple; no masses or thyromegaly. Lungs:  Respirations even and unlabored.  Clear throughout to auscultation.   No wheezes, crackles, or rhonchi. No acute distress. Heart:  Regular rate and rhythm; no murmurs, clicks, rubs, or gallops. Abdomen:  Normal bowel sounds.  No bruits.  Soft, Tenderness with one finger palpation while flexing the  abdominal wall muscles and non-distended without masses, hepatosplenomegaly or hernias noted.  No guarding or rebound tenderness.  Positive Carnett sign.   Rectal:  Deferred.  Msk:  Symmetrical without gross deformities.  Good, equal movement & strength bilaterally. Pulses:  Normal pulses noted. Extremities:  No clubbing or edema.  No cyanosis. Neurologic:  Alert and oriented x3;  grossly normal neurologically. Skin:  Intact without significant lesions or rashes.  No jaundice. Lymph Nodes:  No significant cervical adenopathy. Psych:  Alert and cooperative. Normal mood and affect.  Imaging Studies: No results found.  Assessment and Plan:   CARALINA NOP is a 70 y.o. y/o female Who comes in today with a change in bowel habits with diarrhea after she eats. The patient has a family history of colon cancer. Her last colonoscopy was in 2013. The patient's abdominal pain is  consistent with musculoskeletal pain. The patient will be set up for a colonoscopy due to her change in bowel habits and family of colon cancer. The patient will also be started on dicyclomine 20 mg before meals. I have discussed risks & benefits which include, but are not limited to, bleeding, infection, perforation & drug reaction.  The patient agrees with this plan & written consent will be obtained.      Note: This dictation was prepared with Dragon dictation along with smaller phrase technology. Any transcriptional errors that result from this process are unintentional.

## 2015-05-18 ENCOUNTER — Encounter: Payer: Self-pay | Admitting: Dietician

## 2015-05-18 ENCOUNTER — Encounter: Payer: Medicare Other | Attending: Family Medicine | Admitting: Dietician

## 2015-05-18 VITALS — BP 142/82 | Ht 66.0 in | Wt 214.0 lb

## 2015-05-18 DIAGNOSIS — E1122 Type 2 diabetes mellitus with diabetic chronic kidney disease: Secondary | ICD-10-CM

## 2015-05-18 DIAGNOSIS — E119 Type 2 diabetes mellitus without complications: Secondary | ICD-10-CM | POA: Diagnosis not present

## 2015-05-18 DIAGNOSIS — N183 Chronic kidney disease, stage 3 unspecified: Secondary | ICD-10-CM

## 2015-05-18 NOTE — Progress Notes (Signed)
Diabetes Self-Management Education  Visit Type: First/Initial  Appt. Start Time: 1330 Appt. End Time:1445  05/18/2015  [name], identified by name and date of birth, is a 70 y.o. female with a diagnosis of Diabetes: Type 2.   ASSESSMENT  Blood pressure 142/82, height '5\' 6"'$  (1.676 m), weight 214 lb (97.07 kg). Body mass index is 34.56 kg/(m^2).  c/o chronic constant back pain      Diabetes Self-Management Education - 05/18/15 1537    Visit Information   Visit Type First/Initial   Initial Visit   Diabetes Type Type 2   Health Coping   How would you rate your overall health? Good   Psychosocial Assessment   Patient Belief/Attitude about Diabetes Motivated to manage diabetes   Self-care barriers None   Other persons present Spouse/SO   Patient Concerns Nutrition/Meal planning;Medication;Monitoring;Healthy Lifestyle;Problem Solving;Glycemic Control;Weight Control  prevent worsening complications (has CAD, history of CVA, PVD, CKD, history of DVT)   Special Needs None   Preferred Learning Style Auditory;Visual   Learning Readiness Ready   What is the last grade level you completed in school? 14   Complications   How often do you check your blood sugar? --  purchased BG meter on 05-17-15 and only checked FBG x1=results 185 today   Fasting Blood glucose range (mg/dL) 180-200   Have you had a dilated eye exam in the past 12 months? Yes   Have you had a dental exam in the past 12 months? Yes  08-2014   Are you checking your feet? Yes   How many days per week are you checking your feet? 7   Dietary Intake   Breakfast --  eats breakfast at 9am but occasionally skips breakfast   Snack (morning) --  none   Lunch --  eats lunch at 12p-eats snack foods 2-3x/wk   Snack (afternoon) --  none   Dinner --  eats supper at 7p   Snack (evening) --  none    Beverage(s) --  drinks 4-5 glasses of water daily + 2-3 sugar free drinks/day   Exercise   Exercise Type --  no regular  exercise-limited due to back pain   Patient Education   Previous Diabetes Education No   Disease state  --  pathophysiology of type 2 diabetes + treatment options   Nutrition management  Role of diet in the treatment of diabetes and the relationship between the three main macronutrients and blood glucose level;Food label reading, portion sizes and measuring food.;Carbohydrate counting;Meal timing in regards to the patients' current diabetes medication.   Physical activity and exercise  Role of exercise on diabetes management, blood pressure control and cardiac health.   Medications Reviewed patients medication for diabetes, action, purpose, timing of dose and side effects.   Monitoring Taught/evaluated SMBG meter.;Purpose and frequency of SMBG.;Taught/discussed recording of test results and interpretation of SMBG.;Identified appropriate SMBG and/or A1C goals.;Yearly dilated eye exam   Acute complications Taught treatment of hypoglycemia - the 15 rule.   Chronic complications Relationship between chronic complications and blood glucose control;Identified and discussed with patient  current chronic complications;Dental care;Retinopathy and reason for yearly dilated eye exams;Lipid levels, blood glucose control and heart disease   Personal strategies to promote health Lifestyle issues that need to be addressed for better diabetes care      Individualized Plan for Diabetes Self-Management Training:   Learning Objective:  Patient will have a greater understanding of diabetes self-management. Patient education plan is to attend individual and/or group sessions  per assessed needs and concerns.   Plan:   Patient Instructions   Check blood sugars 2 x day before breakfast and 2 hrs after supper every day Exercise:  Ask MD for permission to exercise Avoid sugar sweetened drinks (soda, tea, coffee, sports drinks, juices) Eat 3 meals day,   2  snacks a day Space meals 4-5 hours apart Include 2-3 carb  servings/meal + protein Include 1 carb serving/snack + protein Make a dentist  appointment Bring blood sugar records to the next appointment/class Get a Sharps container Carry fast acting glucose and a snack at all times Return for appointment/classes on:  06-21-15 Call if questions   Expected Outcomes:   positive  Education material provided: General Meal Planning Guidelines, Low BG handout, 1 tube of glucose tabs for PRN use, Workout to Go handout  If problems or questions, patient to contact team via:  [phone]  Future DSME appointment:  06-21-15

## 2015-05-18 NOTE — Patient Instructions (Signed)
  Check blood sugars 2 x day before breakfast and 2 hrs after supper every day Exercise:  Ask MD for permission to exercise Avoid sugar sweetened drinks (soda, tea, coffee, sports drinks, juices) Eat 3 meals day,   2  snacks a day Space meals 4-5 hours apart Include 2-3 carb servings/meal + protein Include 1 carb serving/snack + protein Make a dentist  appointment Bring blood sugar records to the next appointment/class Get a Sharps container Carry fast acting glucose and a snack at all times Return for appointment/classes on:  06-21-15 Call if questions

## 2015-05-19 ENCOUNTER — Encounter: Payer: Self-pay | Admitting: *Deleted

## 2015-05-20 ENCOUNTER — Encounter: Payer: Self-pay | Admitting: *Deleted

## 2015-05-21 ENCOUNTER — Telehealth: Payer: Self-pay | Admitting: Gastroenterology

## 2015-05-21 ENCOUNTER — Ambulatory Visit: Payer: Medicare Other | Admitting: Family Medicine

## 2015-05-21 ENCOUNTER — Telehealth: Payer: Self-pay | Admitting: Cardiovascular Disease

## 2015-05-21 NOTE — Telephone Encounter (Signed)
Please see blood thinner request in Media. Per Dr. [name], pt is to stop blood thinner 2 days prior to procedure scheduled for march 13th and restart 1 day after. Pt has been notified of instructions.

## 2015-05-21 NOTE — Telephone Encounter (Signed)
Faxed clearance for March 13 colonoscopy to [facility], [phone]

## 2015-05-21 NOTE — Telephone Encounter (Signed)
Patient is having a colonoscopy Monday and needs to know when to stop Eloquest

## 2015-05-21 NOTE — Discharge Instructions (Signed)

## 2015-05-24 ENCOUNTER — Ambulatory Visit
Admission: RE | Admit: 2015-05-24 | Discharge: 2015-05-24 | Disposition: A | Discharge status: Home / Self Care | Payer: Medicare Other | Source: Ambulatory Visit | Attending: Gastroenterology | Admitting: Gastroenterology

## 2015-05-24 ENCOUNTER — Encounter
Admission: RE | Disposition: A | Discharge status: Home / Self Care | Payer: Self-pay | Source: Ambulatory Visit | Attending: Gastroenterology

## 2015-05-24 ENCOUNTER — Ambulatory Visit: Payer: Medicare Other | Admitting: Anesthesiology

## 2015-05-24 DIAGNOSIS — J449 Chronic obstructive pulmonary disease, unspecified: Secondary | ICD-10-CM | POA: Diagnosis not present

## 2015-05-24 DIAGNOSIS — I2510 Atherosclerotic heart disease of native coronary artery without angina pectoris: Secondary | ICD-10-CM | POA: Insufficient documentation

## 2015-05-24 DIAGNOSIS — E1122 Type 2 diabetes mellitus with diabetic chronic kidney disease: Secondary | ICD-10-CM | POA: Diagnosis not present

## 2015-05-24 DIAGNOSIS — N183 Chronic kidney disease, stage 3 (moderate): Secondary | ICD-10-CM | POA: Insufficient documentation

## 2015-05-24 DIAGNOSIS — Z9049 Acquired absence of other specified parts of digestive tract: Secondary | ICD-10-CM | POA: Insufficient documentation

## 2015-05-24 DIAGNOSIS — E785 Hyperlipidemia, unspecified: Secondary | ICD-10-CM | POA: Insufficient documentation

## 2015-05-24 DIAGNOSIS — Z888 Allergy status to other drugs, medicaments and biological substances status: Secondary | ICD-10-CM | POA: Diagnosis not present

## 2015-05-24 DIAGNOSIS — Z86711 Personal history of pulmonary embolism: Secondary | ICD-10-CM | POA: Insufficient documentation

## 2015-05-24 DIAGNOSIS — K219 Gastro-esophageal reflux disease without esophagitis: Secondary | ICD-10-CM | POA: Diagnosis not present

## 2015-05-24 DIAGNOSIS — J45909 Unspecified asthma, uncomplicated: Secondary | ICD-10-CM | POA: Diagnosis not present

## 2015-05-24 DIAGNOSIS — I739 Peripheral vascular disease, unspecified: Secondary | ICD-10-CM | POA: Diagnosis not present

## 2015-05-24 DIAGNOSIS — Z886 Allergy status to analgesic agent status: Secondary | ICD-10-CM | POA: Insufficient documentation

## 2015-05-24 DIAGNOSIS — I129 Hypertensive chronic kidney disease with stage 1 through stage 4 chronic kidney disease, or unspecified chronic kidney disease: Secondary | ICD-10-CM | POA: Insufficient documentation

## 2015-05-24 DIAGNOSIS — D120 Benign neoplasm of cecum: Secondary | ICD-10-CM | POA: Insufficient documentation

## 2015-05-24 DIAGNOSIS — K640 First degree hemorrhoids: Secondary | ICD-10-CM | POA: Insufficient documentation

## 2015-05-24 DIAGNOSIS — Z7901 Long term (current) use of anticoagulants: Secondary | ICD-10-CM | POA: Diagnosis not present

## 2015-05-24 DIAGNOSIS — Z836 Family history of other diseases of the respiratory system: Secondary | ICD-10-CM | POA: Insufficient documentation

## 2015-05-24 DIAGNOSIS — Z7984 Long term (current) use of oral hypoglycemic drugs: Secondary | ICD-10-CM | POA: Insufficient documentation

## 2015-05-24 DIAGNOSIS — Z9889 Other specified postprocedural states: Secondary | ICD-10-CM | POA: Diagnosis not present

## 2015-05-24 DIAGNOSIS — Z87891 Personal history of nicotine dependence: Secondary | ICD-10-CM | POA: Diagnosis not present

## 2015-05-24 DIAGNOSIS — Z801 Family history of malignant neoplasm of trachea, bronchus and lung: Secondary | ICD-10-CM | POA: Diagnosis not present

## 2015-05-24 DIAGNOSIS — Z86010 Personal history of colonic polyps: Secondary | ICD-10-CM | POA: Diagnosis not present

## 2015-05-24 DIAGNOSIS — I5032 Chronic diastolic (congestive) heart failure: Secondary | ICD-10-CM | POA: Diagnosis not present

## 2015-05-24 DIAGNOSIS — Z885 Allergy status to narcotic agent status: Secondary | ICD-10-CM | POA: Diagnosis not present

## 2015-05-24 DIAGNOSIS — D125 Benign neoplasm of sigmoid colon: Secondary | ICD-10-CM | POA: Diagnosis not present

## 2015-05-24 DIAGNOSIS — Z7951 Long term (current) use of inhaled steroids: Secondary | ICD-10-CM | POA: Diagnosis not present

## 2015-05-24 DIAGNOSIS — E669 Obesity, unspecified: Secondary | ICD-10-CM | POA: Diagnosis not present

## 2015-05-24 DIAGNOSIS — Z79899 Other long term (current) drug therapy: Secondary | ICD-10-CM | POA: Diagnosis not present

## 2015-05-24 DIAGNOSIS — D122 Benign neoplasm of ascending colon: Secondary | ICD-10-CM | POA: Diagnosis not present

## 2015-05-24 DIAGNOSIS — Z833 Family history of diabetes mellitus: Secondary | ICD-10-CM | POA: Diagnosis not present

## 2015-05-24 DIAGNOSIS — R197 Diarrhea, unspecified: Secondary | ICD-10-CM | POA: Diagnosis not present

## 2015-05-24 DIAGNOSIS — Z8673 Personal history of transient ischemic attack (TIA), and cerebral infarction without residual deficits: Secondary | ICD-10-CM | POA: Insufficient documentation

## 2015-05-24 DIAGNOSIS — K529 Noninfective gastroenteritis and colitis, unspecified: Secondary | ICD-10-CM | POA: Insufficient documentation

## 2015-05-24 DIAGNOSIS — Z8249 Family history of ischemic heart disease and other diseases of the circulatory system: Secondary | ICD-10-CM | POA: Insufficient documentation

## 2015-05-24 DIAGNOSIS — Z86718 Personal history of other venous thrombosis and embolism: Secondary | ICD-10-CM | POA: Diagnosis not present

## 2015-05-24 DIAGNOSIS — K5730 Diverticulosis of large intestine without perforation or abscess without bleeding: Secondary | ICD-10-CM | POA: Diagnosis not present

## 2015-05-24 DIAGNOSIS — Z800 Family history of malignant neoplasm of digestive organs: Secondary | ICD-10-CM | POA: Insufficient documentation

## 2015-05-24 DIAGNOSIS — Z95820 Peripheral vascular angioplasty status with implants and grafts: Secondary | ICD-10-CM | POA: Insufficient documentation

## 2015-05-24 DIAGNOSIS — E039 Hypothyroidism, unspecified: Secondary | ICD-10-CM | POA: Insufficient documentation

## 2015-05-24 HISTORY — DX: Type 2 diabetes mellitus without complications: E11.9

## 2015-05-24 HISTORY — DX: Cervical disc disorder, unspecified, unspecified cervical region: M50.90

## 2015-05-24 HISTORY — PX: COLONOSCOPY WITH PROPOFOL: SHX5780

## 2015-05-24 HISTORY — PX: POLYPECTOMY: SHX5525

## 2015-05-24 HISTORY — DX: Unspecified asthma, uncomplicated: J45.909

## 2015-05-24 LAB — GLUCOSE, CAPILLARY
Glucose-Capillary: 182 mg/dL — ABNORMAL HIGH (ref 65–99)
Glucose-Capillary: 176 mg/dL — ABNORMAL HIGH (ref 65–99)

## 2015-05-24 SURGERY — COLONOSCOPY WITH PROPOFOL
Anesthesia: Monitor Anesthesia Care

## 2015-05-24 MED ORDER — ACETAMINOPHEN 160 MG/5ML PO SOLN: 325.0000 mg | ORAL | Status: DC | PRN

## 2015-05-24 MED ORDER — STERILE WATER FOR IRRIGATION IR SOLN
Status: DC | PRN
  Administered 2015-05-24: 08:00:00

## 2015-05-24 MED ORDER — ONDANSETRON HCL 4 MG/2ML IJ SOLN: 4.0000 mg | Freq: Once | INTRAMUSCULAR | Status: DC | PRN

## 2015-05-24 MED ORDER — PROPOFOL 10 MG/ML IV BOLUS
INTRAVENOUS | Status: DC | PRN
  Administered 2015-05-24: 20 mg via INTRAVENOUS
  Administered 2015-05-24: 140 mg via INTRAVENOUS
  Administered 2015-05-24: 20 mg via INTRAVENOUS
  Administered 2015-05-24 (×3): 30 mg via INTRAVENOUS

## 2015-05-24 MED ORDER — LIDOCAINE HCL (CARDIAC) 20 MG/ML IV SOLN
INTRAVENOUS | Status: DC | PRN
  Administered 2015-05-24: 50 mg via INTRAVENOUS

## 2015-05-24 MED ORDER — LACTATED RINGERS IV SOLN
INTRAVENOUS | Status: DC
  Administered 2015-05-24: 08:00:00 via INTRAVENOUS

## 2015-05-24 MED ORDER — ACETAMINOPHEN 325 MG PO TABS: 325.0000 mg | ORAL_TABLET | ORAL | Status: DC | PRN

## 2015-05-24 SURGICAL SUPPLY — 28 items
CANISTER SUCT 1200ML W/VALVE (MISCELLANEOUS) ×3 IMPLANT
FCP ESCP3.2XJMB 240X2.8X (MISCELLANEOUS)
FORCEPS BIOP RAD 4 LRG CAP 4 (CUTTING FORCEPS) ×3 IMPLANT
FORCEPS BIOP RJ4 240 W/NDL (MISCELLANEOUS)
FORCEPS ESCP3.2XJMB 240X2.8X (MISCELLANEOUS) IMPLANT
GOWN CVR UNV OPN BCK APRN NK (MISCELLANEOUS) ×4 IMPLANT
GOWN ISOL THUMB LOOP REG UNIV (MISCELLANEOUS) ×2
HEMOCLIP INSTINCT (CLIP) IMPLANT
INJECTOR VARIJECT VIN23 (MISCELLANEOUS) IMPLANT
KIT CO2 TUBING (TUBING) IMPLANT
KIT DEFENDO VALVE AND CONN (KITS) IMPLANT
KIT ENDO PROCEDURE OLY (KITS) ×3 IMPLANT
LIGATOR MULTIBAND 6SHOOTER MBL (MISCELLANEOUS) IMPLANT
MARKER SPOT ENDO TATTOO 5ML (MISCELLANEOUS) IMPLANT
PAD GROUND ADULT SPLIT (MISCELLANEOUS) IMPLANT
SNARE SHORT THROW 13M SML OVAL (MISCELLANEOUS) ×3 IMPLANT
SNARE SHORT THROW 30M LRG OVAL (MISCELLANEOUS) IMPLANT
SPOT EX ENDOSCOPIC TATTOO (MISCELLANEOUS)
SUCTION POLY TRAP 4CHAMBER (MISCELLANEOUS) IMPLANT
TRAP SUCTION POLY (MISCELLANEOUS) ×3 IMPLANT
TUBING CONN 6MMX3.1M (TUBING)
TUBING SUCTION CONN 0.25 STRL (TUBING) IMPLANT
UNDERPAD 30X60 958B10 (PK) (MISCELLANEOUS) IMPLANT
VALVE BIOPSY ENDO (VALVE) IMPLANT
VARIJECT INJECTOR VIN23 (MISCELLANEOUS)
WATER AUXILLARY (MISCELLANEOUS) IMPLANT
WATER STERILE IRR 250ML POUR (IV SOLUTION) ×3 IMPLANT
WATER STERILE IRR 500ML POUR (IV SOLUTION) IMPLANT

## 2015-05-24 NOTE — Anesthesia Preprocedure Evaluation (Signed)
Anesthesia Evaluation  Patient identified by MRN, date of birth, ID band Patient awake    Reviewed: Allergy & Precautions, H&P , NPO status , Patient's Chart, lab work & pertinent test results, reviewed documented beta blocker date and time   History of Anesthesia Complications (+) PONV  Airway Mallampati: II  TM Distance: >3 FB Neck ROM: Full    Dental  (+) Teeth Intact   Pulmonary neg pulmonary ROS, asthma , former smoker, PE   breath sounds clear to auscultation       Cardiovascular hypertension, negative cardio ROS   Rhythm:Regular Rate:Normal     Neuro/Psych TIAnegative neurological ROS  negative psych ROS   GI/Hepatic negative GI ROS, Neg liver ROS, GERD  ,  Endo/Other  negative endocrine ROSdiabetes, Poorly Controlled, Type 2Hypothyroidism   Renal/GU Renal diseasenegative Renal ROS  negative genitourinary   Musculoskeletal negative musculoskeletal ROS (+)   Abdominal (+) + obese,  Abdomen: soft.    Peds negative pediatric ROS (+)  Hematology negative hematology ROS (+)   Anesthesia Other Findings   Reproductive/Obstetrics negative OB ROS                             Anesthesia Physical  Anesthesia Plan  ASA: II  Anesthesia Plan: MAC   Post-op Pain Management:    Induction: Intravenous  Airway Management Planned: Nasal Cannula  Additional Equipment:   Intra-op Plan:   Post-operative Plan:   Informed Consent: I have reviewed the patients History and Physical, chart, labs and discussed the procedure including the risks, benefits and alternatives for the proposed anesthesia with the patient or authorized representative who has indicated his/her understanding and acceptance.   Dental advisory given  Plan Discussed with: CRNA  Anesthesia Plan Comments:         Anesthesia Quick Evaluation

## 2015-05-24 NOTE — Transfer of Care (Signed)
Immediate Anesthesia Transfer of Care Note  Patient: Tammy [name]  Procedure(s) Performed: Procedure(s) with comments: COLONOSCOPY WITH PROPOFOL (N/A) - Diabetic - oral meds POLYPECTOMY  Patient Location: PACU  Anesthesia Type: MAC  Level of Consciousness: awake, alert  and patient cooperative  Airway and Oxygen Therapy: Patient Spontanous Breathing and Patient connected to supplemental oxygen  Post-op Assessment: Post-op Vital signs reviewed, Patient's Cardiovascular Status Stable, Respiratory Function Stable, Patent Airway and No signs of Nausea or vomiting  Post-op Vital Signs: Reviewed and stable  Complications: No apparent anesthesia complications

## 2015-05-24 NOTE — Anesthesia Procedure Notes (Signed)
Procedure Name: MAC Date/Time: 05/24/2015 8:06 AM Performed by: Cameron Ali Pre-anesthesia Checklist: Patient identified, Emergency Drugs available, Suction available, Timeout performed and Patient being monitored Patient Re-evaluated:Patient Re-evaluated prior to inductionOxygen Delivery Method: Nasal cannula Placement Confirmation: positive ETCO2

## 2015-05-24 NOTE — Anesthesia Postprocedure Evaluation (Signed)
Anesthesia Post Note  Patient: Tammy [name]  Procedure(s) Performed: Procedure(s) (LRB): COLONOSCOPY WITH PROPOFOL (N/A) POLYPECTOMY  Patient location during evaluation: PACU Anesthesia Type: MAC Level of consciousness: awake and alert Pain management: pain level controlled Vital Signs Assessment: post-procedure vital signs reviewed and stable Respiratory status: spontaneous breathing, nonlabored ventilation, respiratory function stable and patient connected to nasal cannula oxygen Cardiovascular status: stable and blood pressure returned to baseline Anesthetic complications: no    Amaryllis Dyke

## 2015-05-24 NOTE — H&P (Signed)
[facility]  [address], [place], [place] [address] Phone: [phone] Fax : [phone]  Primary Care Physician:  Enid Derry, MD Primary Gastroenterologist:  Dr. Allen Norris  Pre-Procedure History & Physical: HPI:  Tammy [name] is a 70 y.o. female is here for an colonoscopy.   Past Medical History  Diagnosis Date  . Coronary artery disease     a. 11/2011 Cath: LAD 59md, LCX min irregs, RCA 70p, 1056mith L->R collats, EF 60%-->Med Rx.  . Marland KitchenVD (peripheral vascular disease) ([facility]    a. PTA of right leg and left femoral artery with stenosis.  . Marland KitchenOPD (chronic obstructive pulmonary disease) ([facility]  . CKD (chronic kidney disease), stage III   . Chronic diastolic CHF (congestive heart failure) ([facility] [place]    a. 07/2012 Echo: Nl EF. mild inferoseptal HK, Gr 2 DD, mild conc LVH, mild PR/TR, mild PAH.  . Marland Kitchenalpitations   . Benign neoplasm of colon   . GERD (gastroesophageal reflux disease)   . Hypothyroidism   . History of tobacco abuse     a. Quit 2011.  . PE (pulmonary embolism) 10/15  . Hyperlipidemia   . Hypertension   . Obesity   . History of DVT (deep vein thrombosis)   . PONV (postoperative nausea and vomiting)   . Stroke ([facility]    TIAs - no deficits  . Asthma   . Cervical disc disease     "bulging" - no limitations per pt  . Diabetes mellitus without complication ([facility]    Past Surgical History  Procedure Laterality Date  . Angioplasty / stenting femoral      PVD; angioplasty right leg and left femoral artery with stenosis.   . Trigger finger release    . Thyroidectomy    . Cholecystectomy    . Cardiac catheterization  12/07/2011    Mid LAD 50%, distal LAD 50%, mid RCA 70%, distal RCA 100%   . Cardiac catheterization  01/04/2011    100% occluded mid RCA with good collaterals from distal LAD, normal LVEF.  . Back surgery  03/2012  . Colonoscopy  2013  . Laparoscopic appendectomy N/A 01/06/2015    Procedure: APPENDECTOMY LAPAROSCOPIC;  Surgeon:  SeChristene LyeMD;  Location: [facility] ORS;  Service: General;  Laterality: N/A;  . Appendectomy  Oct. 2016    Prior to Admission medications   Medication Sig Start Date End Date Taking? Authorizing Provider  blood glucose meter kit and supplies KIT Dispense based on patient and insurance preference. Use one time daily as directed, E11.9 04/23/15  Yes MeArnetha CourserMD  Cholecalciferol 10000 UNITS CAPS Take 1 capsule by mouth 2 (two) times a week.   Yes Historical Provider, MD  dicyclomine (BENTYL) 20 MG tablet Take 1 tablet (20 mg total) by mouth 3 (three) times daily before meals. 05/17/15  Yes DaLucilla LameMD  ELIQUIS 5 MG TABS tablet TAKE ONE TABLET BY MOUTH TWICE DAILY 02/15/15  Yes TiMinna MerrittsMD  fluocinonide (LIDEX) 0.05 % external solution Reported on 05/18/2015 02/15/15  Yes Historical Provider, MD  fluticasone (FAsencion Islam50 MCG/ACT nasal spray Reported on 05/17/2015 11/27/14  Yes Historical Provider, MD  gabapentin (NEURONTIN) 300 MG capsule TAKE 1 CAPSULE 3 TIMES DAILY Patient taking differently: TAKE 2 CAPSULE at bedtime  DAILY 04/07/15  Yes MeArnetha CourserMD  glipiZIDE (GLUCOTROL) 5 MG tablet Take 0.5 tablets (2.5 mg total) by mouth daily before breakfast. 04/23/15  Yes MeArnetha Courser  MD  hydrOXYzine (ATARAX/VISTARIL) 10 MG tablet TAKE 1 TABLET BY MOUTH 3 TIMES DAILY AS NEEDED FOR ITCHING 05/04/15  Yes Arnetha Courser, MD  isosorbide mononitrate (IMDUR) 60 MG 24 hr tablet TAKE TWO TABLETS EVERY DAY Patient taking differently: TAKE 0.5 TABLETS in AM and 1 tablet at night daily 02/03/15  Yes [name], MD  losartan (COZAAR) 50 MG tablet Take 1 tablet (50 mg total) by mouth daily. 04/07/15  Yes Arnetha Courser, MD  Magnesium 400 MG TABS Take 400 mg by mouth daily.   Yes Historical Provider, MD  metoprolol succinate (TOPROL-XL) 50 MG 24 hr tablet TAKE ONE TABLET EVERY DAY 12/28/14  Yes [name], MD  mirabegron ER (MYRBETRIQ) 25 MG TB24 tablet Take 25 mg by mouth daily.   Yes  Historical Provider, MD  nitroGLYCERIN (NITROSTAT) 0.4 MG SL tablet Place 1 tablet (0.4 mg total) under the tongue every 5 (five) minutes as needed for chest pain. 05/12/14  Yes [name], MD  pantoprazole (PROTONIX) 40 MG tablet Take 40 mg by mouth at bedtime.    Yes Historical Provider, MD  PROAIR HFA 108 (90 Base) MCG/ACT inhaler INHALE 2 PUFFS EVERY 4 HOURS AS NEEDED FOR WHEEZING OR SHORTNESS OF BREATH 05/17/15  Yes Arnetha Courser, MD  simvastatin (ZOCOR) 40 MG tablet TAKE ONE TABLET AT BEDTIME 10/13/14  Yes [name], MD  SYNTHROID 112 MCG tablet Take 112 mcg by mouth daily before breakfast.  04/08/13  Yes Historical Provider, MD  vitamin B-12 (CYANOCOBALAMIN) 500 MCG tablet Take 500 mcg by mouth daily.   Yes Historical Provider, MD  ZETIA 10 MG tablet TAKE ONE TABLET BY MOUTH EVERY DAY 01/11/15  Yes [name], MD    Allergies as of 05/17/2015 - Review Complete 05/17/2015  Allergen Reaction Noted  . Aleve [naproxen sodium]  06/13/2013  . Oxycodone Other (See Comments) 01/15/2014  . Ranexa [ranolazine]  06/13/2013  . Tramadol Other (See Comments) 01/15/2014    Family History  Problem Relation Age of Onset  . Hypertension Father   . Heart disease Father   . Heart attack Father   . Diabetes Father   . Heart attack Brother   . Diabetes Brother   . Hypertension Brother   . Heart disease Brother   . Heart attack Brother   . Diabetes Brother   . Hypertension Brother   . Heart disease Brother   . Heart attack Brother   . Diabetes Brother   . Hypertension Brother   . Heart disease Brother   . Cancer Mother     colon  . Cancer Sister     lung  . COPD Sister   . COPD Sister     Social History   Social History  . Marital Status: Married    Spouse Name: N/A  . Number of Children: N/A  . Years of Education: N/A   Occupational History  . Not on file.   Social History Main Topics  . Smoking status: Former Smoker -- 0.50 packs/day for 15 years    Types:  Cigarettes    Quit date: 03/13/2009  . Smokeless tobacco: Never Used  . Alcohol Use: No  . Drug Use: No  . Sexual Activity: Not on file   Other Topics Concern  . Not on file   Social History Narrative    Review of Systems: See HPI, otherwise negative ROS  Physical Exam: BP 128/72 mmHg  Pulse 79  Temp(Src) 97.7  F (36.5 C) (Temporal)  Resp 16  Ht 5' 6"  (1.676 m)  Wt 210 lb (95.255 kg)  BMI 33.91 kg/m2  SpO2 93% General:   Alert,  pleasant and cooperative in NAD Head:  Normocephalic and atraumatic. Neck:  Supple; no masses or thyromegaly. Lungs:  Clear throughout to auscultation.    Heart:  Regular rate and rhythm. Abdomen:  Soft, nontender and nondistended. Normal bowel sounds, without guarding, and without rebound.   Neurologic:  Alert and  oriented x4;  grossly normal neurologically.  Impression/Plan: Tammy [name] is here for an colonoscopy to be performed for diarrhea  Risks, benefits, limitations, and alternatives regarding  colonoscopy have been reviewed with the patient.  Questions have been answered.  All parties agreeable.   Ollen Bowl, MD  05/24/2015, 7:41 AM

## 2015-05-24 NOTE — Op Note (Signed)
[facility] Patient Name: Tammy [name] Procedure Date: 05/24/2015 7:59 AM MRN: [id number] Account #: [id number] Date of Birth: 01/16/1946 Admit Type: Outpatient Age: 70 Room: [facility] OR ROOM 01 Gender: Female Note Status: Finalized Procedure:            Colonoscopy Indications:          Chronic diarrhea Providers:            [name], MD Referring MD:         Arnetha Courser (Referring MD) Medicines:            Propofol per Anesthesia Complications:        No immediate complications. Procedure:            Pre-Anesthesia Assessment:                       - Prior to the procedure, a History and Physical was                        performed, and patient medications and allergies were                        reviewed. The patient's tolerance of previous                        anesthesia was also reviewed. The risks and benefits of                        the procedure and the sedation options and risks were                        discussed with the patient. All questions were                        answered, and informed consent was obtained. Prior                        Anticoagulants: The patient has taken no previous                        anticoagulant or antiplatelet agents. ASA Grade                        Assessment: II - A patient with mild systemic disease.                        After reviewing the risks and benefits, the patient was                        deemed in satisfactory condition to undergo the                        procedure.                       After obtaining informed consent, the colonoscope was                        passed under direct vision. Throughout the procedure,  the patient's blood pressure, pulse, and oxygen                        saturations were monitored continuously. The Olympus CF                        H180AL colonoscope (S#: [id number]) was introduced through                        the anus and  advanced to the the terminal ileum. The                        colonoscopy was performed without difficulty. The                        patient tolerated the procedure well. The quality of                        the bowel preparation was excellent. Findings:      The perianal and digital rectal examinations were normal.      Three sessile polyps were found in the cecum. The polyps were 3 to 5 mm       in size. These polyps were removed with a cold biopsy forceps. Resection       and retrieval were complete.      Two sessile polyps were found in the ascending colon. The polyps were 3       to 4 mm in size. These polyps were removed with a cold biopsy forceps.       Resection and retrieval were complete.      A 6 mm polyp was found in the sigmoid colon. The polyp was sessile. The       polyp was removed with a cold snare. Resection and retrieval were       complete.      Multiple small-mouthed diverticula were found in the sigmoid colon.      Non-bleeding internal hemorrhoids were found during retroflexion. The       hemorrhoids were Grade I (internal hemorrhoids that do not prolapse).      The terminal ileum appeared normal. Biopsies were taken with a cold       forceps for histology. Impression:           - Three 3 to 5 mm polyps in the cecum, removed with a                        cold biopsy forceps. Resected and retrieved.                       - Two 3 to 4 mm polyps in the ascending colon, removed                        with a cold biopsy forceps. Resected and retrieved.                       - One 6 mm polyp in the sigmoid colon, removed with a                        cold snare. Resected and retrieved.                       -  Diverticulosis in the sigmoid colon.                       - Non-bleeding internal hemorrhoids.                       - The examined portion of the ileum was normal.                        Biopsied. Recommendation:       - Await pathology results. Procedure  Code(s):    --- Professional ---                       [phone], Colonoscopy, flexible; with removal of tumor(s),                        polyp(s), or other lesion(s) by snare technique                       45380, 28, Colonoscopy, flexible; with biopsy, single                        or multiple Diagnosis Code(s):    --- Professional ---                       K52.9, Noninfective gastroenteritis and colitis,                        unspecified                       D12.0, Benign neoplasm of cecum                       D12.2, Benign neoplasm of ascending colon                       D12.5, Benign neoplasm of sigmoid colon CPT copyright 2016 American Medical Association. All rights reserved. The codes documented in this report are preliminary and upon coder review may  be revised to meet current compliance requirements. [name], MD 05/24/2015 8:28:31 AM This report has been signed electronically. Number of Addenda: 0 Note Initiated On: 05/24/2015 7:59 AM Scope Withdrawal Time: 0 hours 12 minutes 45 seconds  Total Procedure Duration: 0 hours 15 minutes 36 seconds       [facility]

## 2015-05-25 ENCOUNTER — Encounter: Payer: Self-pay | Admitting: Gastroenterology

## 2015-05-26 ENCOUNTER — Encounter: Payer: Self-pay | Admitting: Family Medicine

## 2015-05-26 ENCOUNTER — Ambulatory Visit (INDEPENDENT_AMBULATORY_CARE_PROVIDER_SITE_OTHER): Payer: Medicare Other | Admitting: Family Medicine

## 2015-05-26 VITALS — BP 119/75 | HR 70 | Temp 98.0°F | Wt 210.0 lb

## 2015-05-26 DIAGNOSIS — E1165 Type 2 diabetes mellitus with hyperglycemia: Secondary | ICD-10-CM

## 2015-05-26 DIAGNOSIS — IMO0002 Reserved for concepts with insufficient information to code with codable children: Secondary | ICD-10-CM

## 2015-05-26 DIAGNOSIS — J309 Allergic rhinitis, unspecified: Secondary | ICD-10-CM

## 2015-05-26 DIAGNOSIS — Z1231 Encounter for screening mammogram for malignant neoplasm of breast: Secondary | ICD-10-CM | POA: Diagnosis not present

## 2015-05-26 DIAGNOSIS — Z5181 Encounter for therapeutic drug level monitoring: Secondary | ICD-10-CM

## 2015-05-26 DIAGNOSIS — E118 Type 2 diabetes mellitus with unspecified complications: Secondary | ICD-10-CM

## 2015-05-26 DIAGNOSIS — I25111 Atherosclerotic heart disease of native coronary artery with angina pectoris with documented spasm: Secondary | ICD-10-CM

## 2015-05-26 MED ORDER — MOMETASONE FUROATE 50 MCG/ACT NA SUSP: NASAL | Status: DC

## 2015-05-26 NOTE — Assessment & Plan Note (Signed)
LDL under 70 last check; check today

## 2015-05-26 NOTE — Patient Instructions (Addendum)
We'll check some labs today If you have not heard anything from my staff in a week about any orders/referrals/studies from today, please contact us here to follow-up [phone] Try to limit saturated fats in your diet (bologna, hot dogs, barbeque, cheeseburgers, hamburgers, steak, bacon, sausage, cheese, etc.) and get more fresh fruits, vegetables, and whole grains Check feet every night Please do call to schedule your mammogram; the number to schedule one at either [facility] or [facility] is [phone]

## 2015-05-26 NOTE — Assessment & Plan Note (Signed)
Check A1c today; foot exam UTD

## 2015-05-26 NOTE — Progress Notes (Signed)
BP 119/75 mmHg  Pulse 70  Temp(Src) 98 F (36.7 C)  Wt 210 lb (95.255 kg)  SpO2 96%   Subjective:    Patient ID: Tammy [name], female    DOB: 03-30-1945, 70 y.o.   MRN: [id number]  HPI: Tammy [name] is a 70 y.o. female  Chief Complaint  Patient presents with  . Diabetes    started Glipizide at her last appointment  . URI    has had a "cold" off and on for a while. Hacky cough off and on,   Type 2 DM Her sugars have been really good; 103; gets a little dizzy sometimes; 143 this morning; yesterday morning was 103; the other night 127; father had diabetes; four brothers had diabetes; no sisters had diabetes; no dry mouth; little blurred vision, has been to the eye doctor twice in the last few months; not diabetes related; no macular degeneration or glaucoma  Taking the gabapentin 600 mg at bedtime; was too sleepy if taken during the day  She has had a cold on and off for a while; the cough is terrible; right now, not as hacky; last night it was really bad; draining from above, nose and sinuses; ears a little backed up; no fevers; sneezing too; does not feel bad; has tried nasal spray and coricidine HBP  High cholesterol; reviewed lipids from one year ago and then five months; she had crackers today, otherwise fasting  Relevant past medical, surgical, family and social history reviewed and updated as indicated. Interim medical history since our last visit reviewed. Allergies and medications reviewed and updated.  Review of Systems Per HPI unless specifically indicated above     Objective:    BP 119/75 mmHg  Pulse 70  Temp(Src) 98 F (36.7 C)  Wt 210 lb (95.255 kg)  SpO2 96%  Wt Readings from Last 3 Encounters:  05/26/15 210 lb (95.255 kg)  05/24/15 210 lb (95.255 kg)  05/18/15 214 lb (97.07 kg)    Physical Exam  Constitutional: She appears well-developed and well-nourished. No distress.  HENT:  Head: Normocephalic and atraumatic.  Nose: Rhinorrhea (clear) present.   Eyes: EOM are normal. No scleral icterus.  Neck: No JVD present. No thyromegaly present.  Cardiovascular: Normal rate and regular rhythm.   Pulmonary/Chest: Effort normal and breath sounds normal.  Abdominal: She exhibits no distension.  Musculoskeletal: She exhibits no edema.  Neurological: She is alert.  Skin: No pallor.  Psychiatric: She has a normal mood and affect.    Diabetic Foot Exam - Simple   Simple Foot Form  Diabetic Foot exam was performed with the following findings:  Yes   Visual Inspection  No deformities, no ulcerations, no other skin breakdown bilaterally:  Yes  Sensation Testing  Intact to touch and monofilament testing bilaterally:  Yes  Pulse Check  See comments:  Yes  Comments  D pedis pulse 2+ bilaterally        Assessment & Plan:   Problem List Items Addressed This Visit      Cardiovascular and Mediastinum   Coronary artery disease    LDL under 70 last check; check today      Relevant Orders   Lipid Panel w/o Chol/HDL Ratio (Completed)     Endocrine   Diabetes mellitus type 2 with complications, uncontrolled ([place]) - Primary    Check A1c today; foot exam UTD        Other   Medication monitoring encounter   Relevant Orders  ALT (Completed)    Other Visit Diagnoses    Visit for screening mammogram        Relevant Orders    MM SCREENING BREAST TOMO BILATERAL       Follow up plan: Return in about 6 months (around 11/26/2015) for fasting labs and visit at [facility].  An after-visit summary was printed and given to the patient at [place].  Please see the patient instructions which may contain other information and recommendations beyond what is mentioned above in the assessment and plan. Orders Placed This Encounter  Procedures  . MM SCREENING BREAST TOMO BILATERAL  . Lipid Panel w/o Chol/HDL Ratio  . ALT  . Basic metabolic panel  . Hgb A1c w/o eAG   Meds ordered this encounter  Medications  . mometasone (NASONEX) 50 MCG/ACT  nasal spray    Sig: 2 sprays in each nostril once a day    Dispense:  17 g    Refill:  12    Stop other nasal spray

## 2015-05-27 ENCOUNTER — Telehealth: Payer: Self-pay | Admitting: Family Medicine

## 2015-05-27 LAB — BASIC METABOLIC PANEL
BUN/Creatinine Ratio: 17 (ref 11–26)
BUN: 27 mg/dL (ref 8–27)
CO2: 22 mmol/L (ref 18–29)
Calcium: 9.6 mg/dL (ref 8.7–10.3)
Chloride: 96 mmol/L (ref 96–106)
Creatinine, Ser: 1.6 mg/dL — ABNORMAL HIGH (ref 0.57–1.00)
GFR calc Af Amer: 37 mL/min/{1.73_m2} — ABNORMAL LOW (ref >59)
GFR calc non Af Amer: 32 mL/min/{1.73_m2} — ABNORMAL LOW (ref >59)
Glucose: 133 mg/dL — ABNORMAL HIGH (ref 65–99)
Potassium: 5 mmol/L (ref 3.5–5.2)
Sodium: 138 mmol/L (ref 134–144)

## 2015-05-27 LAB — HGB A1C W/O EAG: Hgb A1c MFr Bld: 9.3 % — ABNORMAL HIGH (ref 4.8–5.6)

## 2015-05-27 LAB — LIPID PANEL W/O CHOL/HDL RATIO
Cholesterol, Total: 184 mg/dL (ref 100–199)
HDL: 37 mg/dL — ABNORMAL LOW (ref >39)
LDL Calculated: 86 mg/dL (ref 0–99)
Triglycerides: 304 mg/dL — ABNORMAL HIGH (ref 0–149)
VLDL Cholesterol Cal: 61 mg/dL — ABNORMAL HIGH (ref 5–40)

## 2015-05-27 LAB — ALT: ALT: 12 IU/L (ref 0–32)

## 2015-05-27 NOTE — Telephone Encounter (Signed)
Let patient know that her A1c level is above goal; we want it under 7 and it's 9.3 I would like to have her get some more FSBS readings Ask her to pick a meal each day and do a reading RIGHT BEFORE she starts eating and then 90 to 120 minutes AFTER she started her meal She could do breakfast on Monday, lunch on Tuesday, dinner on Wednesday, and just rotate them around so we get a good idea of when her sugars are going up and if mealtimes are the problem Do that for a week and then call us back so I can adjust her medicine Also, REALLY try to eat healthy and work on weight loss; we know it's easier said that done, but we think she could really get her diabetes under control without more expensive medicine if she can lose 20-30 pounds Her kidney function has declined somewhat, so please send the lab results to Dr. Juleen China Encourage her to stay hydrated Her TG have gone up so eat better, lose weight, and add krill oil BID Recheck labs in 3 months (fasting)

## 2015-05-27 NOTE — Telephone Encounter (Signed)
Patient notified

## 2015-05-27 NOTE — Telephone Encounter (Signed)
Labs faxed to Dr.Kolluru.

## 2015-05-31 ENCOUNTER — Encounter: Payer: Self-pay | Admitting: Gastroenterology

## 2015-06-09 NOTE — Assessment & Plan Note (Signed)
Switch nasal corticosteroids

## 2015-06-14 DIAGNOSIS — N183 Chronic kidney disease, stage 3 (moderate): Secondary | ICD-10-CM | POA: Diagnosis not present

## 2015-06-14 DIAGNOSIS — I129 Hypertensive chronic kidney disease with stage 1 through stage 4 chronic kidney disease, or unspecified chronic kidney disease: Secondary | ICD-10-CM | POA: Diagnosis not present

## 2015-06-14 DIAGNOSIS — E1122 Type 2 diabetes mellitus with diabetic chronic kidney disease: Secondary | ICD-10-CM | POA: Diagnosis not present

## 2015-06-14 DIAGNOSIS — E785 Hyperlipidemia, unspecified: Secondary | ICD-10-CM | POA: Diagnosis not present

## 2015-06-16 ENCOUNTER — Encounter: Payer: Self-pay | Admitting: Cardiovascular Disease

## 2015-06-16 ENCOUNTER — Ambulatory Visit (INDEPENDENT_AMBULATORY_CARE_PROVIDER_SITE_OTHER): Payer: Medicare Other | Admitting: Cardiovascular Disease

## 2015-06-16 VITALS — BP 110/62 | HR 58 | Ht 66.0 in | Wt 211.0 lb

## 2015-06-16 DIAGNOSIS — I2699 Other pulmonary embolism without acute cor pulmonale: Secondary | ICD-10-CM

## 2015-06-16 DIAGNOSIS — E118 Type 2 diabetes mellitus with unspecified complications: Secondary | ICD-10-CM

## 2015-06-16 DIAGNOSIS — I25111 Atherosclerotic heart disease of native coronary artery with angina pectoris with documented spasm: Secondary | ICD-10-CM | POA: Diagnosis not present

## 2015-06-16 DIAGNOSIS — I209 Angina pectoris, unspecified: Secondary | ICD-10-CM

## 2015-06-16 DIAGNOSIS — E1165 Type 2 diabetes mellitus with hyperglycemia: Secondary | ICD-10-CM

## 2015-06-16 DIAGNOSIS — IMO0002 Reserved for concepts with insufficient information to code with codable children: Secondary | ICD-10-CM

## 2015-06-16 DIAGNOSIS — E785 Hyperlipidemia, unspecified: Secondary | ICD-10-CM

## 2015-06-16 DIAGNOSIS — I700 Atherosclerosis of aorta: Secondary | ICD-10-CM | POA: Diagnosis not present

## 2015-06-16 DIAGNOSIS — I10 Essential (primary) hypertension: Secondary | ICD-10-CM

## 2015-06-16 MED ORDER — ROSUVASTATIN CALCIUM 40 MG PO TABS: 40.0000 mg | ORAL_TABLET | Freq: Every day | ORAL | Status: DC

## 2015-06-16 NOTE — Assessment & Plan Note (Signed)
Cholesterol is Above goal Recommended she stop the simvastatin when she is done, change to Crestor 40 mg daily to achieve goal LDL less than 70

## 2015-06-16 NOTE — Assessment & Plan Note (Signed)
We have encouraged continued exercise, careful diet management in an effort to lose weight.  recommended low carbohydrate diet

## 2015-06-16 NOTE — Assessment & Plan Note (Signed)
Atypical chest pain,Relieved with nitroglycerin,  Discussed the various treatment options with her  if symptoms get worse, recommended she call and we would schedule a stress test, pharmacological.  She is unable to treadmill

## 2015-06-16 NOTE — Progress Notes (Signed)
Patient ID: Tammy [name], female    DOB: 1945/07/30, 70 y.o.   MRN: [id number]  HPI Comments: Ms. Cake is a 70 year old woman with history of coronary artery disease, occluded mid RCA, PAD, hypertension, back surgery January 2014 with rods placed at that time, remote smoking history stopped 5 years ago,  obesity, deconditioned secondary to chronic back pain who presents for routine followup of her coronary artery disease   PAD with angioplasty of the legs bilaterally, COPD, chronic kidney disease  diabetes type 2, uncontrolled  diagnosis of PE in October 2015, started on anticoagulation, eliquis  In follow-up, she reports having rare episodes of chest pain relieved with nitroglycerin Symptoms present at rest, She denies any regular exercise regiment or activity. 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 Her weight has continued to trend upwards, she has chronic shortness of breath on exertion Husband does most of the ADLs She feels that she does not get out of the house to do very much at all secondary to having to work all the time  EKG on today's visit shows normal sinus rhythm with rate 58 bpm, nonspecific ST abnormality, no change from prior EKGs  Other past medical history Previously in the emergency room, treated for bronchial asthma. Takes her albuterol with some improvement of her symptoms. Shortness of breath symptoms have been dating back for many years.  History of urinary tract infection.   Other past medical history Previous hemoglobin A1c 7.6   may have developed a DVT from sitting long periods of time. 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 Catheterization in 2012 documented occluded mid RCA with left to right collaterals, 40% mid LAD disease, 40% mid circumflex disease,  30% proximal LAD disease Catheterization in September 2013 showed normal LV function, occluded mid RCA with left to right collaterals, 50% mid and distal LAD disease  Stress test September 2014 showing very small anterior wall defect likely secondary to breast attenuation artifact Echocardiogram May 2014 showing low normal ejection fraction, mild TR, mild pulmonary hypertension      Allergies  Allergen Reactions  . Aleve [Naproxen Sodium]     Hives & itching   . Contrast Media [Iodinated Diagnostic Agents]     Pt avoids due to kidney issues  . Oxycodone Other (See Comments)    hallucinations  . Ranexa [Ranolazine]     Sick on stomach  . Tramadol Other (See Comments)    nightmares    Outpatient Encounter Prescriptions as of 06/16/2015  Medication Sig  . blood glucose meter kit and supplies KIT Dispense based on patient and insurance preference. Use one time daily as directed, E11.9  . ELIQUIS 5 MG TABS tablet TAKE ONE TABLET BY MOUTH TWICE DAILY  . fluocinonide (LIDEX) 0.05 % external solution Reported on 05/18/2015  . gabapentin (NEURONTIN) 300 MG capsule TAKE 1 CAPSULE 3 TIMES DAILY (Patient taking differently: TAKE 2 CAPSULE at bedtime  DAILY)  . glipiZIDE (GLUCOTROL) 5 MG tablet Take 0.5 tablets (2.5 mg total) by mouth daily before breakfast.  . isosorbide mononitrate (IMDUR) 60 MG 24 hr tablet TAKE TWO TABLETS EVERY DAY (Patient taking differently: TAKE 0.5 TABLETS in AM and 1 tablet at night daily)  . losartan (COZAAR) 50 MG tablet Take 1 tablet (50 mg total) by mouth daily.  [name]  metoprolol succinate (TOPROL-XL) 50 MG 24 hr tablet TAKE ONE TABLET EVERY DAY  . mirabegron ER (MYRBETRIQ) 25 MG TB24 tablet Take 25 mg by mouth daily.  . mometasone (NASONEX) 50 MCG/ACT nasal spray 2 sprays in each nostril once a day  . nitroGLYCERIN (NITROSTAT) 0.4 MG SL tablet Place 1 tablet (0.4 mg total) under the tongue every 5 (five) minutes as needed for chest pain.  . pantoprazole (PROTONIX) 40  MG tablet Take 40 mg by mouth at bedtime.   [name] PROAIR HFA 108 (90 Base) MCG/ACT inhaler INHALE 2 PUFFS EVERY 4 HOURS AS NEEDED FOR WHEEZING OR SHORTNESS OF BREATH  . SYNTHROID 112 MCG tablet Take 112 mcg by mouth daily before breakfast.   . [DISCONTINUED] simvastatin (ZOCOR) 40 MG tablet TAKE ONE TABLET AT BEDTIME  . rosuvastatin (CRESTOR) 40 MG tablet Take 1 tablet (40 mg total) by mouth daily.   No facility-administered encounter medications on file as of 06/16/2015.    Past Medical History  Diagnosis Date  . Coronary artery disease     a. 11/2011 Cath: LAD 65md, LCX min irregs, RCA 70p, 1023mith L->R collats, EF 60%-->Med Rx.  . Marland KitchenVD (peripheral vascular disease) ([facility]    a. PTA of right leg and left femoral artery with stenosis.  . Marland KitchenOPD (chronic obstructive pulmonary disease) ([facility]  . CKD (chronic kidney disease), stage III   . Chronic diastolic CHF (congestive heart failure) ([facility]    a. 07/2012 Echo: Nl EF. mild inferoseptal HK, Gr 2 DD, mild conc LVH, mild PR/TR, mild PAH.  . Marland Kitchenalpitations   . Benign neoplasm of colon   . GERD (gastroesophageal reflux disease)   . Hypothyroidism   . History of tobacco abuse     a. Quit 2011.  . PE (pulmonary embolism) 10/15  . Hyperlipidemia   . Hypertension   . Obesity   . History of DVT (deep vein thrombosis)   . PONV (postoperative nausea and vomiting)   . Stroke ([facility]    TIAs - no deficits  . Asthma   . Cervical disc disease     "bulging" - no limitations per pt  . [facility]    Past Surgical History  Procedure Laterality Date  . Angioplasty / stenting femoral      PVD; angioplasty right leg and left femoral artery with stenosis.   . Trigger finger release    . Thyroidectomy    . Cholecystectomy    . Cardiac catheterization  12/07/2011    Mid LAD 50%, distal LAD 50%, mid RCA 70%, distal RCA 100%   . Cardiac catheterization  01/04/2011    100% occluded mid RCA with good collaterals from  distal LAD, normal LVEF.  . Back surgery  03/2012  . Colonoscopy  2013  . Laparoscopic appendectomy N/A 01/06/2015    Procedure: APPENDECTOMY LAPAROSCOPIC;  Surgeon: SeChristene LyeMD;  Location: [facility] ORS;  Service: General;  Laterality: N/A;  . Appendectomy  Oct. 2016  . Colonoscopy with propofol N/A 05/24/2015    Procedure: COLONOSCOPY WITH PROPOFOL;  Surgeon: DaLucilla LameMD;  [place] [facility] Service: Endoscopy;  Laterality: N/A;  Diabetic - oral meds  . Polypectomy  05/24/2015    Procedure: POLYPECTOMY;  Surgeon: DaLucilla LameMD;  [place] [facility] [facility] Service: Endoscopy;;    Social History  reports that she quit smoking about 6 years ago. Her smoking use included Cigarettes. She has a 7.5 pack-year  smoking history. She has never used smokeless tobacco. She reports that she does not drink alcohol or use illicit drugs.  Family History family history includes COPD in her sister and sister; Cancer in her mother and sister; Diabetes in her brother, brother, brother, and father; Heart attack in her brother, brother, brother, and father; Heart disease in her brother, brother, brother, and father; Hypertension in her brother, brother, brother, and father.   Review of Systems  Constitutional: Negative.   Respiratory: Positive for shortness of breath.   Cardiovascular: Negative.   Musculoskeletal: Positive for back pain and gait problem.  Neurological: Negative.   Hematological: Negative.   Psychiatric/Behavioral: Negative.   All other systems reviewed and are negative.   BP 110/62 mmHg  Pulse 58  Ht 5' 6"  (1.676 m)  Wt 211 lb (95.709 kg)  BMI 34.07 kg/m2  Physical Exam  Constitutional: She is oriented to person, place, and time. She appears well-developed and well-nourished.  Obese  HENT:  Head: Normocephalic.  Nose: Nose normal.  Mouth/Throat: Oropharynx is clear and moist.  Eyes: Conjunctivae are normal. Pupils are equal, round, and reactive to  light.  Neck: Normal range of motion. Neck supple. No JVD present.  Cardiovascular: Normal rate, regular rhythm, S1 normal, S2 normal, normal heart sounds and intact distal pulses.  Exam reveals no gallop and no friction rub.   No murmur heard. Pulmonary/Chest: Effort normal and breath sounds normal. No respiratory distress. She has no wheezes. She has no rales. She exhibits no tenderness.  Abdominal: Soft. Bowel sounds are normal. She exhibits no distension. There is no tenderness.  Musculoskeletal: Normal range of motion. She exhibits no edema or tenderness.  Lymphadenopathy:    She has no cervical adenopathy.  Neurological: She is alert and oriented to person, place, and time. Coordination normal.  Skin: Skin is warm and dry. No rash noted. No erythema.  Psychiatric: She has a normal mood and affect. Her behavior is normal. Judgment and thought content normal.    Assessment and Plan  Nursing note and vitals reviewed.

## 2015-06-16 NOTE — Assessment & Plan Note (Signed)
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

## 2015-06-16 NOTE — Assessment & Plan Note (Signed)
High risk of DVT and PE as she is sitting for most of the day  currently on anticoagulation

## 2015-06-16 NOTE — Assessment & Plan Note (Signed)
Blood pressure is well controlled on today's visit. No changes made to the medications.   Total encounter time more than 25 minutes  Greater than 50% was spent in counseling and coordination of care with the patient  

## 2015-06-16 NOTE — Patient Instructions (Addendum)
You are doing well.  Please stop the simvastatin when you are done  Then start crestor one a day  Fasting lipid and liver profile in 3 months. Nothing to eat or drink after midnight the evening before your labs.   Please call us if you have new issues that need to be addressed before your next appt.  Your physician wants you to follow-up in: 6 months.  You will receive a reminder letter in the mail two months in advance. If you don't receive a letter, please call our office to schedule the follow-up appointment.

## 2015-06-21 ENCOUNTER — Ambulatory Visit: Payer: PRIVATE HEALTH INSURANCE

## 2015-06-21 ENCOUNTER — Telehealth: Payer: Self-pay | Admitting: Dietician

## 2015-06-21 NOTE — Telephone Encounter (Signed)
Called patient and left message for her to call back to reschedule class series, as she has missed class 1 this evening.

## 2015-06-24 ENCOUNTER — Other Ambulatory Visit: Payer: Self-pay | Admitting: Family Medicine

## 2015-06-24 ENCOUNTER — Ambulatory Visit (INDEPENDENT_AMBULATORY_CARE_PROVIDER_SITE_OTHER): Payer: Medicare Other | Admitting: Family Medicine

## 2015-06-24 ENCOUNTER — Encounter: Payer: Self-pay | Admitting: Family Medicine

## 2015-06-24 VITALS — BP 122/84 | HR 82 | Temp 97.7°F | Resp 16 | Ht 66.0 in | Wt 212.0 lb

## 2015-06-24 DIAGNOSIS — M545 Low back pain, unspecified: Secondary | ICD-10-CM

## 2015-06-24 DIAGNOSIS — N390 Urinary tract infection, site not specified: Secondary | ICD-10-CM

## 2015-06-24 DIAGNOSIS — I209 Angina pectoris, unspecified: Secondary | ICD-10-CM | POA: Diagnosis not present

## 2015-06-24 DIAGNOSIS — D120 Benign neoplasm of cecum: Secondary | ICD-10-CM

## 2015-06-24 DIAGNOSIS — D125 Benign neoplasm of sigmoid colon: Secondary | ICD-10-CM | POA: Diagnosis not present

## 2015-06-24 DIAGNOSIS — M5430 Sciatica, unspecified side: Secondary | ICD-10-CM

## 2015-06-24 DIAGNOSIS — D122 Benign neoplasm of ascending colon: Secondary | ICD-10-CM

## 2015-06-24 LAB — POCT URINALYSIS DIPSTICK
Bilirubin, UA: NEGATIVE
Glucose, UA: NEGATIVE
Ketones, UA: NEGATIVE
Nitrite, UA: NEGATIVE
Protein, UA: NEGATIVE
Spec Grav, UA: 1.02
Urobilinogen, UA: 0.2
pH, UA: 7.5

## 2015-06-24 MED ORDER — GABAPENTIN 300 MG PO CAPS: ORAL_CAPSULE | ORAL | Status: DC

## 2015-06-24 MED ORDER — DOXYCYCLINE HYCLATE 100 MG PO TABS: 100.0000 mg | ORAL_TABLET | Freq: Two times a day (BID) | ORAL | Status: DC

## 2015-06-24 NOTE — Patient Instructions (Signed)
We'll culture the urine Start the new antibiotics Please do eat yogurt daily or take a probiotic daily for the next month or two We want to replace the healthy germs in the gut If you notice foul, watery diarrhea in the next two months, schedule an appointment RIGHT AWAY Drink cranberry juice or take a real cranberry pill daily to help prevent infections

## 2015-06-24 NOTE — Progress Notes (Signed)
BP 122/84 mmHg  Pulse 82  Temp(Src) 97.7 F (36.5 C) (Oral)  Resp 16  Ht '5\' 6"'$  (1.676 m)  Wt 212 lb (96.163 kg)  BMI 34.23 kg/m2  SpO2 97%   Subjective:    Patient ID: Tammy [name], female    DOB: Apr 12, 1945, 70 y.o.   MRN: [id number]  HPI: Tammy [name] is a 70 y.o. female  Chief Complaint  Patient presents with  . Urinary Tract Infection    onset 1 week, history  of UTI.  Has been having low back pain and cloudy urine.  No other symptoms reported.    Depression screen [facility] 2/9 06/24/2015 05/18/2015  Decreased Interest 0 0  Down, Depressed, Hopeless 0 0  PHQ - 2 Score 0 0   She started to have sx a week ago of UTI; right sided back pain with cloudy urine, strong smell to urine; no fevers; no blood in the urine; no chills or N/V; urologist is Dr. Bernardo Heater, also sees nephrologist Dr. Juleen China Previous tough to treat UTI; was on several antibiotics She has chronic kidney and ureter problems on the right (hydronephrosis) with UPJ obstruction  She had a colonoscopy, 8 polyps, some precancerous; so glad she had that done  Relevant past medical, surgical, family and social history reviewed and updated as indicated. Interim medical history since our last visit reviewed. Allergies and medications reviewed and updated. Still finishing up simvastatin and then will start Crestor, per Dr. [name]  Review of Systems Per HPI unless specifically indicated above     Objective:    BP 122/84 mmHg  Pulse 82  Temp(Src) 97.7 F (36.5 C) (Oral)  Resp 16  Ht '5\' 6"'$  (1.676 m)  Wt 212 lb (96.163 kg)  BMI 34.23 kg/m2  SpO2 97%  Wt Readings from Last 3 Encounters:  06/24/15 212 lb (96.163 kg)  06/16/15 211 lb (95.709 kg)  05/26/15 210 lb (95.255 kg)    Physical Exam  Constitutional: She appears well-developed and well-nourished.  Eyes: No scleral icterus.  Cardiovascular: Normal rate and regular rhythm.   Pulmonary/Chest: Effort normal and breath sounds normal.  Abdominal: Soft. There is  no tenderness. There is no guarding.  Skin: She is not diaphoretic. No pallor.  Psychiatric: She has a normal mood and affect.    Results for orders placed or performed in visit on 06/24/15  POCT urinalysis dipstick  Result Value Ref Range   Color, UA yellow    Clarity, UA clear    Glucose, UA neg    Bilirubin, UA neg    Ketones, UA neg    Spec Grav, UA 1.020    Blood, UA trace    pH, UA 7.5    Protein, UA neg    Urobilinogen, UA 0.2    Nitrite, UA neg    Leukocytes, UA large (3+) (A) Negative      Assessment & Plan:   Problem List Items Addressed This Visit      Digestive   Benign neoplasm of sigmoid colon    Reviewed colonoscopy report with patient; glad she had procedure done      Benign neoplasm of cecum    Reviewed colonoscopy report with patient; glad she had procedure done      Benign neoplasm of ascending colon    Reviewed colonoscopy report with patient; glad she had procedure done        Nervous and Auditory   Neuralgia neuritis, sciatic nerve    Two gabapentin at  night, one in the morning if needed; new Rx sent      Relevant Medications   gabapentin (NEURONTIN) 300 MG capsule     Genitourinary   Frequent UTI    With another UTI today; start doxycycline; culture urine; if symptoms worsen on current therapy, call or go to urgent care or ER; cranberry juice/pill for prevention; patient was instructed to call her urologist to inform him that she has yet another infection, as he had talked with her earlier about starting daily prophylaxis I believe        Other   Back pain - Primary   Relevant Orders   POCT urinalysis dipstick (Completed)       Follow up plan: No Follow-up on file.  An after-visit summary was printed and given to the patient at [place].  Please see the patient instructions which may contain other information and recommendations beyond what is mentioned above in the assessment and plan.  Meds ordered this encounter    Medications  . simvastatin (ZOCOR) 40 MG tablet    Sig:   . gabapentin (NEURONTIN) 300 MG capsule    Sig: Take one pill by mouth in the morning if needed, and take pills by mouth at bedtime    Dispense:  250 capsule    Refill:  1    PLEASE SEND REFILLSTHANK YOU  . doxycycline (VIBRA-TABS) 100 MG tablet    Sig: Take 1 tablet (100 mg total) by mouth 2 (two) times daily.    Dispense:  20 tablet    Refill:  0    Orders Placed This Encounter  Procedures  . POCT urinalysis dipstick

## 2015-06-24 NOTE — Assessment & Plan Note (Signed)
Reviewed colonoscopy report with patient; glad she had procedure done

## 2015-06-24 NOTE — Assessment & Plan Note (Signed)
With another UTI today; start doxycycline; culture urine; if symptoms worsen on current therapy, call or go to urgent care or ER; cranberry juice/pill for prevention; patient was instructed to call her urologist to inform him that she has yet another infection, as he had talked with her earlier about starting daily prophylaxis I believe

## 2015-06-24 NOTE — Assessment & Plan Note (Signed)
Two gabapentin at night, one in the morning if needed; new Rx sent

## 2015-06-26 ENCOUNTER — Other Ambulatory Visit: Payer: Self-pay | Admitting: Family Medicine

## 2015-06-28 ENCOUNTER — Ambulatory Visit: Payer: PRIVATE HEALTH INSURANCE

## 2015-06-28 LAB — URINE CULTURE

## 2015-06-29 ENCOUNTER — Other Ambulatory Visit: Payer: Self-pay | Admitting: Family Medicine

## 2015-07-05 ENCOUNTER — Ambulatory Visit: Payer: PRIVATE HEALTH INSURANCE

## 2015-07-12 ENCOUNTER — Telehealth: Payer: Self-pay | Admitting: Dietician

## 2015-07-12 NOTE — Telephone Encounter (Signed)
Have not heard from pt. Called pt-pt does not want to reschedule diabetes classes at this time due to recurring painful  UTI's-pt will call back later to reschedule classes

## 2015-07-27 ENCOUNTER — Encounter: Payer: Self-pay | Admitting: Dietician

## 2015-07-27 NOTE — Progress Notes (Signed)
Pt discharged

## 2015-07-28 DIAGNOSIS — N390 Urinary tract infection, site not specified: Secondary | ICD-10-CM | POA: Diagnosis not present

## 2015-07-31 ENCOUNTER — Other Ambulatory Visit: Payer: Self-pay | Admitting: Family Medicine

## 2015-07-31 NOTE — Telephone Encounter (Signed)
Patient should be seeing nephrologist now; I put in referral to kidney doctor in January; please ask her to get this medicine (losartan) from the kidney doctor going forward, as he'll be keeping a much closer watch on her kidney function and potassium; thank you

## 2015-08-02 ENCOUNTER — Other Ambulatory Visit: Payer: Self-pay | Admitting: Cardiovascular Disease

## 2015-08-12 ENCOUNTER — Other Ambulatory Visit: Payer: Self-pay | Admitting: Family Medicine

## 2015-08-30 DIAGNOSIS — N390 Urinary tract infection, site not specified: Secondary | ICD-10-CM | POA: Diagnosis not present

## 2015-09-07 ENCOUNTER — Other Ambulatory Visit (INDEPENDENT_AMBULATORY_CARE_PROVIDER_SITE_OTHER): Payer: Medicare Other | Admitting: *Deleted

## 2015-09-07 DIAGNOSIS — I25111 Atherosclerotic heart disease of native coronary artery with angina pectoris with documented spasm: Secondary | ICD-10-CM | POA: Diagnosis not present

## 2015-09-08 LAB — HEPATIC FUNCTION PANEL
ALT: 10 IU/L (ref 0–32)
AST: 20 IU/L (ref 0–40)
Albumin: 4 g/dL (ref 3.5–4.8)
Alkaline Phosphatase: 85 IU/L (ref 39–117)
Bilirubin Total: 0.3 mg/dL (ref 0.0–1.2)
Bilirubin, Direct: 0.1 mg/dL (ref 0.00–0.40)
Total Protein: 7 g/dL (ref 6.0–8.5)

## 2015-09-08 LAB — LIPID PANEL
Chol/HDL Ratio: 3.2 ratio units (ref 0.0–4.4)
Cholesterol, Total: 124 mg/dL (ref 100–199)
HDL: 39 mg/dL — ABNORMAL LOW (ref >39)
LDL Calculated: 35 mg/dL (ref 0–99)
Triglycerides: 249 mg/dL — ABNORMAL HIGH (ref 0–149)
VLDL Cholesterol Cal: 50 mg/dL — ABNORMAL HIGH (ref 5–40)

## 2015-09-24 ENCOUNTER — Other Ambulatory Visit: Payer: Self-pay | Admitting: Family Medicine

## 2015-09-27 DIAGNOSIS — N183 Chronic kidney disease, stage 3 (moderate): Secondary | ICD-10-CM | POA: Diagnosis not present

## 2015-09-27 DIAGNOSIS — E1122 Type 2 diabetes mellitus with diabetic chronic kidney disease: Secondary | ICD-10-CM | POA: Diagnosis not present

## 2015-09-27 DIAGNOSIS — I10 Essential (primary) hypertension: Secondary | ICD-10-CM | POA: Diagnosis not present

## 2015-09-27 DIAGNOSIS — N390 Urinary tract infection, site not specified: Secondary | ICD-10-CM | POA: Diagnosis not present

## 2015-10-01 ENCOUNTER — Encounter: Payer: Self-pay | Admitting: Family Medicine

## 2015-10-01 ENCOUNTER — Ambulatory Visit (INDEPENDENT_AMBULATORY_CARE_PROVIDER_SITE_OTHER): Payer: Medicare Other | Admitting: Family Medicine

## 2015-10-01 ENCOUNTER — Other Ambulatory Visit: Payer: Self-pay

## 2015-10-01 VITALS — BP 120/78 | HR 80 | Temp 97.7°F | Resp 16 | Wt 214.0 lb

## 2015-10-01 DIAGNOSIS — M549 Dorsalgia, unspecified: Secondary | ICD-10-CM | POA: Diagnosis not present

## 2015-10-01 DIAGNOSIS — R1031 Right lower quadrant pain: Secondary | ICD-10-CM

## 2015-10-01 DIAGNOSIS — I77811 Abdominal aortic ectasia: Secondary | ICD-10-CM | POA: Diagnosis not present

## 2015-10-01 DIAGNOSIS — I209 Angina pectoris, unspecified: Secondary | ICD-10-CM

## 2015-10-01 NOTE — Assessment & Plan Note (Signed)
rescan fall of 2021

## 2015-10-01 NOTE — Progress Notes (Signed)
BP 120/78   Pulse 80   Temp 97.7 F (36.5 C) (Oral)   Resp 16   Wt 214 lb (97.1 kg)   SpO2 93%   BMI 34.54 kg/m     Subjective:    Patient ID: Tammy [name], female    DOB: September 02, 1945, 70 y.o.   MRN: [id number]  HPI: Tammy [name] is a 70 y.o. female  Chief Complaint  Patient presents with  . Abdominal Pain    and back   Patient is here for an acute visit She is having abdominal pain for many months, >6 months; she talked with one of her doctors about it and he thought maybe it was constipation She has pain on the right side; where her appendix is; not sure if from her urinary tract infection She sees nephrologist and urologist; Dr. Bernardo Heater has given her two more antibiotics and the low dose to stay on for prevention Something isn't right; no fevers but felt hot and then felt freezing, cold chill bumps and everything The urine has a strong smell Taking trimethoprim for prevention I had given her doxy, then Dr. Bernardo Heater gave her cipro twice She saw Dr. Juleen China last week No blood in the urine; no pain with urinary tract infections Avoid constricting clothes around the waist She just had a colonoscopy not long ago; they found recancerous polyps, 3-4 months ago  Dr. [name] checked lipids last of June and this is on Crestor, had switched late from simvastatin; she used up simvastatin and was only on Crestor for 2-4 weeks prior to labs drawn; not eating greasy stuff; normal hepatic function tests on June 27th; not much fatty meats, but does eat some cheese  Ongoing back pain even after 3 surgeries; can't stand for too long; fused the lower vertebrae, has screws and bolts in her lower back; then had a pinched nerve in the right leg and had to scrape the spine, then in the left leg and did that; thinks they waited too long; still has numbness in the left foot; Dr. [name] in [place] in the back doctor  Feet feel ice cold; no B/B dysfunction; she goes to see Dr. Lucky Cowboy next week and he will  check her vascular status  Depression screen [facility] 2/9 10/01/2015 06/24/2015 05/18/2015  Decreased Interest 0 0 0  Down, Depressed, Hopeless 0 0 0  PHQ - 2 Score 0 0 0   Relevant past medical, surgical, family and social history reviewed Past Medical History:  Diagnosis Date  . Asthma   . Benign neoplasm of colon   . Cervical disc disease    "bulging" - no limitations per pt  . Chronic diastolic CHF (congestive heart failure) ([place])    a. 07/2012 Echo: Nl EF. mild inferoseptal HK, Gr 2 DD, mild conc LVH, mild PR/TR, mild PAH.  . CKD (chronic kidney disease), stage III   . COPD (chronic obstructive pulmonary disease) ([place])   . Coronary artery disease    a. 11/2011 Cath: LAD 4md, LCX min irregs, RCA 70p, 1034mith L->R collats, EF 60%-->Med Rx.  . Diabetes [place] without complication ([place]  . GERD (gastroesophageal reflux disease)   . History of DVT (deep vein thrombosis)   . History of tobacco abuse    a. Quit 2011.  . Marland Kitchenyperlipidemia   . Hypertension   . Hypothyroidism   . Obesity   . Palpitations   . PE (pulmonary embolism) 10/15  . PONV (postoperative nausea and vomiting)   .  PVD (peripheral vascular disease) ([place])    a. PTA of right leg and left femoral artery with stenosis.  . Stroke ([place])    TIAs - no deficits   Past Surgical History:  Procedure Laterality Date  . ANGIOPLASTY / STENTING FEMORAL     PVD; angioplasty right leg and left femoral artery with stenosis.   . APPENDECTOMY  Oct. 2016  . BACK SURGERY  03/2012  . CARDIAC CATHETERIZATION  12/07/2011   Mid LAD 50%, distal LAD 50%, mid RCA 70%, distal RCA 100%   . CARDIAC CATHETERIZATION  01/04/2011   100% occluded mid RCA with good collaterals from distal LAD, normal LVEF.  [name] CHOLECYSTECTOMY    . COLONOSCOPY  2013  . COLONOSCOPY WITH PROPOFOL N/A 05/24/2015   Procedure: COLONOSCOPY WITH PROPOFOL;  Surgeon: [name], MD;  Location: [place];  Service: Endoscopy;  Laterality: N/A;  Diabetic - oral meds    . LAPAROSCOPIC APPENDECTOMY N/A 01/06/2015   Procedure: APPENDECTOMY LAPAROSCOPIC;  Surgeon: [name], MD;  Location: [facility] ORS;  Service: General;  Laterality: N/A;  . POLYPECTOMY  05/24/2015   Procedure: POLYPECTOMY;  Surgeon: [name], MD;  Location: [place];  Service: Endoscopy;;  . THYROIDECTOMY    . TRIGGER FINGER RELEASE     Family History  Problem Relation Age of Onset  . Hypertension Father   . Heart disease Father   . Heart attack Father   . Diabetes Father   . Heart attack Brother   . Diabetes Brother   . Hypertension Brother   . Heart disease Brother   . Heart attack Brother   . Diabetes Brother   . Hypertension Brother   . Heart disease Brother   . Heart attack Brother   . Diabetes Brother   . Hypertension Brother   . Heart disease Brother   . Cancer Mother     colon  . Cancer Sister     lung  . COPD Sister   . COPD Sister    Social History  Substance Use Topics  . Smoking status: Former Smoker    Packs/day: 0.50    Years: 15.00    Types: Cigarettes    Quit date: 03/13/2009  . Smokeless tobacco: Never Used  . Alcohol use No   Interim medical history since last visit reviewed. Allergies and medications reviewed  Review of Systems Per HPI unless specifically indicated above     Objective:    BP 120/78   Pulse 80   Temp 97.7 F (36.5 C) (Oral)   Resp 16   Wt 214 lb (97.1 kg)   SpO2 93%   BMI 34.54 kg/m    Wt Readings from Last 3 Encounters:  10/01/15 214 lb (97.1 kg)  06/24/15 212 lb (96.2 kg)  06/16/15 211 lb (95.7 kg)   body mass index is 34.54 kg/m.  Physical Exam  Constitutional: She appears well-developed and well-nourished. No distress.  Weight up two pounds over last 3 months, obese  Eyes: No scleral icterus.  Neck: No JVD present.  Cardiovascular: Normal rate and regular rhythm.   Pulmonary/Chest: Effort normal and breath sounds normal.  Abdominal: She exhibits no distension and no mass. There is  tenderness (RLQ). There is no guarding.  Musculoskeletal: She exhibits no edema.  Neurological: She is alert.  Skin: No pallor.  Psychiatric: She has a normal mood and affect.    Results for orders placed or performed in visit on 09/07/15  Lipid Profile  Result  Value Ref Range   Cholesterol, Total 124 100 - 199 mg/dL   Triglycerides 249 (H) 0 - 149 mg/dL   HDL 39 (L) >39 mg/dL   VLDL Cholesterol Cal 50 (H) 5 - 40 mg/dL   LDL Calculated 35 0 - 99 mg/dL   Chol/HDL Ratio 3.2 0.0 - 4.4 ratio units  Hepatic function panel  Result Value Ref Range   Total Protein 7.0 6.0 - 8.5 g/dL   Albumin 4.0 3.5 - 4.8 g/dL   Bilirubin Total 0.3 0.0 - 1.2 mg/dL   Bilirubin, Direct 0.10 0.00 - 0.40 mg/dL   Alkaline Phosphatase 85 39 - 117 IU/L   AST 20 0 - 40 IU/L   ALT 10 0 - 32 IU/L      Assessment & Plan:   Problem List Items Addressed This Visit      Cardiovascular and Mediastinum   Abdominal aortic ectasia (HCC)    rescan fall of 2021        Other   Back pain    Refer back to Dr. [name] in [place]      Relevant Orders   Ambulatory referral to Orthopedic Surgery    Other Visit Diagnoses    Right lower quadrant abdominal pain    -  Primary   persistent; s/p colonoscopy; s/p two CT scans, last Jan 2017; will get [place] to avoid further radiation; consider radiculopathy at T11-12 as cause of pain   Relevant Orders   US Pelvis Complete   US Abdomen Complete      Follow up plan: No Follow-up on file.  An after-visit summary was printed and given to the patient at [place].  Please see the patient instructions which may contain other information and recommendations beyond what is mentioned above in the assessment and plan.  Meds ordered this encounter  Medications  . trimethoprim (TRIMPEX) 100 MG tablet    Sig: Take 100 mg by mouth.    Orders Placed This Encounter  Procedures  . US Pelvis Complete  . US Abdomen Complete  . Ambulatory referral to Orthopedic Surgery

## 2015-10-01 NOTE — Patient Instructions (Addendum)
Let's get scans of your abdomen and pelvis Let's have you see Dr. Leander Rams Call me with update next week or if getting worse

## 2015-10-01 NOTE — Assessment & Plan Note (Signed)
Refer back to Dr. [name] in [place]

## 2015-10-05 DIAGNOSIS — M79609 Pain in unspecified limb: Secondary | ICD-10-CM | POA: Diagnosis not present

## 2015-10-05 DIAGNOSIS — E669 Obesity, unspecified: Secondary | ICD-10-CM | POA: Diagnosis not present

## 2015-10-05 DIAGNOSIS — I739 Peripheral vascular disease, unspecified: Secondary | ICD-10-CM | POA: Diagnosis not present

## 2015-10-05 DIAGNOSIS — E785 Hyperlipidemia, unspecified: Secondary | ICD-10-CM | POA: Diagnosis not present

## 2015-10-05 DIAGNOSIS — I10 Essential (primary) hypertension: Secondary | ICD-10-CM | POA: Diagnosis not present

## 2015-10-05 DIAGNOSIS — I70213 Atherosclerosis of native arteries of extremities with intermittent claudication, bilateral legs: Secondary | ICD-10-CM | POA: Diagnosis not present

## 2015-10-06 ENCOUNTER — Ambulatory Visit
Admission: RE | Admit: 2015-10-06 | Discharge: 2015-10-06 | Disposition: A | Discharge status: Home / Self Care | Payer: Medicare Other | Source: Ambulatory Visit | Attending: Family Medicine | Admitting: Family Medicine

## 2015-10-06 ENCOUNTER — Ambulatory Visit: Payer: Medicare Other

## 2015-10-06 DIAGNOSIS — I77811 Abdominal aortic ectasia: Secondary | ICD-10-CM | POA: Insufficient documentation

## 2015-10-06 DIAGNOSIS — R1031 Right lower quadrant pain: Secondary | ICD-10-CM | POA: Diagnosis not present

## 2015-10-06 DIAGNOSIS — N1330 Unspecified hydronephrosis: Secondary | ICD-10-CM | POA: Diagnosis not present

## 2015-10-06 DIAGNOSIS — K760 Fatty (change of) liver, not elsewhere classified: Secondary | ICD-10-CM | POA: Diagnosis not present

## 2015-10-06 DIAGNOSIS — N183 Chronic kidney disease, stage 3 (moderate): Secondary | ICD-10-CM | POA: Diagnosis not present

## 2015-10-06 DIAGNOSIS — N390 Urinary tract infection, site not specified: Secondary | ICD-10-CM | POA: Diagnosis not present

## 2015-10-11 DIAGNOSIS — M542 Cervicalgia: Secondary | ICD-10-CM | POA: Diagnosis not present

## 2015-10-11 DIAGNOSIS — M545 Low back pain: Secondary | ICD-10-CM | POA: Diagnosis not present

## 2015-10-12 ENCOUNTER — Telehealth: Payer: Self-pay | Admitting: Family Medicine

## 2015-10-12 MED ORDER — GLIPIZIDE 5 MG PO TABS: 5.0000 mg | ORAL_TABLET | Freq: Every day | ORAL | 5 refills | Status: DC

## 2015-10-12 NOTE — Telephone Encounter (Signed)
New Rx sent.

## 2015-10-19 DIAGNOSIS — M9951 Intervertebral disc stenosis of neural canal of cervical region: Secondary | ICD-10-CM | POA: Diagnosis not present

## 2015-10-19 DIAGNOSIS — M9953 Intervertebral disc stenosis of neural canal of lumbar region: Secondary | ICD-10-CM | POA: Diagnosis not present

## 2015-10-26 DIAGNOSIS — M4806 Spinal stenosis, lumbar region: Secondary | ICD-10-CM | POA: Diagnosis not present

## 2015-11-01 ENCOUNTER — Telehealth: Payer: Self-pay | Admitting: Family Medicine

## 2015-11-02 DIAGNOSIS — M9983 Other biomechanical lesions of lumbar region: Secondary | ICD-10-CM | POA: Diagnosis not present

## 2015-11-02 DIAGNOSIS — M4806 Spinal stenosis, lumbar region: Secondary | ICD-10-CM | POA: Diagnosis not present

## 2015-11-05 DIAGNOSIS — M4806 Spinal stenosis, lumbar region: Secondary | ICD-10-CM | POA: Diagnosis not present

## 2015-11-05 DIAGNOSIS — M9983 Other biomechanical lesions of lumbar region: Secondary | ICD-10-CM | POA: Diagnosis not present

## 2015-11-08 DIAGNOSIS — M9983 Other biomechanical lesions of lumbar region: Secondary | ICD-10-CM | POA: Diagnosis not present

## 2015-11-08 DIAGNOSIS — M4806 Spinal stenosis, lumbar region: Secondary | ICD-10-CM | POA: Diagnosis not present

## 2015-11-11 DIAGNOSIS — M4806 Spinal stenosis, lumbar region: Secondary | ICD-10-CM | POA: Diagnosis not present

## 2015-11-11 DIAGNOSIS — M9983 Other biomechanical lesions of lumbar region: Secondary | ICD-10-CM | POA: Diagnosis not present

## 2015-11-16 ENCOUNTER — Other Ambulatory Visit: Payer: Self-pay | Admitting: Family Medicine

## 2015-11-17 NOTE — Telephone Encounter (Signed)
done

## 2015-11-22 ENCOUNTER — Telehealth: Payer: Self-pay | Admitting: Family Medicine

## 2015-11-22 ENCOUNTER — Emergency Department: Payer: Medicare Other

## 2015-11-22 ENCOUNTER — Inpatient Hospital Stay
Admission: EM | Admit: 2015-11-22 | Discharge: 2015-11-24 | DRG: 292 | Disposition: A | Discharge status: Home / Self Care | Payer: Medicare Other | Attending: Specialist | Admitting: Specialist

## 2015-11-22 ENCOUNTER — Encounter: Payer: Self-pay | Admitting: *Deleted

## 2015-11-22 DIAGNOSIS — I10 Essential (primary) hypertension: Secondary | ICD-10-CM | POA: Diagnosis not present

## 2015-11-22 DIAGNOSIS — N183 Chronic kidney disease, stage 3 (moderate): Secondary | ICD-10-CM | POA: Diagnosis present

## 2015-11-22 DIAGNOSIS — I4891 Unspecified atrial fibrillation: Secondary | ICD-10-CM | POA: Diagnosis present

## 2015-11-22 DIAGNOSIS — E669 Obesity, unspecified: Secondary | ICD-10-CM | POA: Diagnosis present

## 2015-11-22 DIAGNOSIS — E1122 Type 2 diabetes mellitus with diabetic chronic kidney disease: Secondary | ICD-10-CM | POA: Diagnosis present

## 2015-11-22 DIAGNOSIS — I2510 Atherosclerotic heart disease of native coronary artery without angina pectoris: Secondary | ICD-10-CM | POA: Diagnosis present

## 2015-11-22 DIAGNOSIS — Z9889 Other specified postprocedural states: Secondary | ICD-10-CM

## 2015-11-22 DIAGNOSIS — Z23 Encounter for immunization: Secondary | ICD-10-CM | POA: Diagnosis not present

## 2015-11-22 DIAGNOSIS — Z91041 Radiographic dye allergy status: Secondary | ICD-10-CM

## 2015-11-22 DIAGNOSIS — I130 Hypertensive heart and chronic kidney disease with heart failure and stage 1 through stage 4 chronic kidney disease, or unspecified chronic kidney disease: Principal | ICD-10-CM | POA: Diagnosis present

## 2015-11-22 DIAGNOSIS — Z79899 Other long term (current) drug therapy: Secondary | ICD-10-CM

## 2015-11-22 DIAGNOSIS — K219 Gastro-esophageal reflux disease without esophagitis: Secondary | ICD-10-CM | POA: Diagnosis present

## 2015-11-22 DIAGNOSIS — N390 Urinary tract infection, site not specified: Secondary | ICD-10-CM | POA: Diagnosis present

## 2015-11-22 DIAGNOSIS — E1151 Type 2 diabetes mellitus with diabetic peripheral angiopathy without gangrene: Secondary | ICD-10-CM | POA: Diagnosis present

## 2015-11-22 DIAGNOSIS — Z833 Family history of diabetes mellitus: Secondary | ICD-10-CM | POA: Diagnosis not present

## 2015-11-22 DIAGNOSIS — Z809 Family history of malignant neoplasm, unspecified: Secondary | ICD-10-CM

## 2015-11-22 DIAGNOSIS — Z86711 Personal history of pulmonary embolism: Secondary | ICD-10-CM

## 2015-11-22 DIAGNOSIS — Z9049 Acquired absence of other specified parts of digestive tract: Secondary | ICD-10-CM | POA: Diagnosis not present

## 2015-11-22 DIAGNOSIS — Z86718 Personal history of other venous thrombosis and embolism: Secondary | ICD-10-CM | POA: Diagnosis not present

## 2015-11-22 DIAGNOSIS — E039 Hypothyroidism, unspecified: Secondary | ICD-10-CM | POA: Diagnosis present

## 2015-11-22 DIAGNOSIS — Z888 Allergy status to other drugs, medicaments and biological substances status: Secondary | ICD-10-CM

## 2015-11-22 DIAGNOSIS — E890 Postprocedural hypothyroidism: Secondary | ICD-10-CM | POA: Diagnosis present

## 2015-11-22 DIAGNOSIS — E785 Hyperlipidemia, unspecified: Secondary | ICD-10-CM | POA: Diagnosis present

## 2015-11-22 DIAGNOSIS — N289 Disorder of kidney and ureter, unspecified: Secondary | ICD-10-CM

## 2015-11-22 DIAGNOSIS — N189 Chronic kidney disease, unspecified: Secondary | ICD-10-CM | POA: Diagnosis present

## 2015-11-22 DIAGNOSIS — N179 Acute kidney failure, unspecified: Secondary | ICD-10-CM | POA: Diagnosis present

## 2015-11-22 DIAGNOSIS — E860 Dehydration: Secondary | ICD-10-CM | POA: Diagnosis present

## 2015-11-22 DIAGNOSIS — Z87891 Personal history of nicotine dependence: Secondary | ICD-10-CM | POA: Diagnosis not present

## 2015-11-22 DIAGNOSIS — I5032 Chronic diastolic (congestive) heart failure: Secondary | ICD-10-CM | POA: Diagnosis present

## 2015-11-22 DIAGNOSIS — R42 Dizziness and giddiness: Secondary | ICD-10-CM

## 2015-11-22 DIAGNOSIS — Z825 Family history of asthma and other chronic lower respiratory diseases: Secondary | ICD-10-CM

## 2015-11-22 DIAGNOSIS — Z8673 Personal history of transient ischemic attack (TIA), and cerebral infarction without residual deficits: Secondary | ICD-10-CM

## 2015-11-22 DIAGNOSIS — Z8249 Family history of ischemic heart disease and other diseases of the circulatory system: Secondary | ICD-10-CM | POA: Diagnosis not present

## 2015-11-22 DIAGNOSIS — J449 Chronic obstructive pulmonary disease, unspecified: Secondary | ICD-10-CM | POA: Diagnosis present

## 2015-11-22 DIAGNOSIS — G9389 Other specified disorders of brain: Secondary | ICD-10-CM | POA: Diagnosis not present

## 2015-11-22 LAB — BASIC METABOLIC PANEL
Anion gap: 8 (ref 5–15)
Anion gap: 5 (ref 5–15)
BUN: 34 mg/dL — ABNORMAL HIGH (ref 6–20)
BUN: 33 mg/dL — ABNORMAL HIGH (ref 6–20)
CO2: 21 mmol/L — ABNORMAL LOW (ref 22–32)
CO2: 24 mmol/L (ref 22–32)
Calcium: 8.3 mg/dL — ABNORMAL LOW (ref 8.9–10.3)
Calcium: 7.8 mg/dL — ABNORMAL LOW (ref 8.9–10.3)
Chloride: 105 mmol/L (ref 101–111)
Chloride: 107 mmol/L (ref 101–111)
Creatinine, Ser: 2.29 mg/dL — ABNORMAL HIGH (ref 0.44–1.00)
Creatinine, Ser: 2.31 mg/dL — ABNORMAL HIGH (ref 0.44–1.00)
GFR calc Af Amer: 23 mL/min — ABNORMAL LOW (ref >60)
GFR calc Af Amer: 23 mL/min — ABNORMAL LOW (ref >60)
GFR calc non Af Amer: 20 mL/min — ABNORMAL LOW (ref >60)
GFR calc non Af Amer: 20 mL/min — ABNORMAL LOW (ref >60)
Glucose, Bld: 161 mg/dL — ABNORMAL HIGH (ref 65–99)
Glucose, Bld: 118 mg/dL — ABNORMAL HIGH (ref 65–99)
Potassium: 4.8 mmol/L (ref 3.5–5.1)
Potassium: 4.9 mmol/L (ref 3.5–5.1)
Sodium: 134 mmol/L — ABNORMAL LOW (ref 135–145)
Sodium: 136 mmol/L (ref 135–145)

## 2015-11-22 LAB — URINALYSIS COMPLETE WITH MICROSCOPIC (ARMC ONLY)
Bilirubin Urine: NEGATIVE
Glucose, UA: NEGATIVE mg/dL
Ketones, ur: NEGATIVE mg/dL
Nitrite: NEGATIVE
Protein, ur: 30 mg/dL — AB
Specific Gravity, Urine: 1.006 (ref 1.005–1.030)
pH: 6 (ref 5.0–8.0)

## 2015-11-22 LAB — CBC
HCT: 38.7 % (ref 35.0–47.0)
Hemoglobin: 12.9 g/dL (ref 12.0–16.0)
MCH: 29.5 pg (ref 26.0–34.0)
MCHC: 33.4 g/dL (ref 32.0–36.0)
MCV: 88.3 fL (ref 80.0–100.0)
Platelets: 258 10*3/uL (ref 150–440)
RBC: 4.38 MIL/uL (ref 3.80–5.20)
RDW: 13.7 % (ref 11.5–14.5)
WBC: 11.5 10*3/uL — ABNORMAL HIGH (ref 3.6–11.0)

## 2015-11-22 LAB — MAGNESIUM: Magnesium: 2.1 mg/dL (ref 1.7–2.4)

## 2015-11-22 LAB — GLUCOSE, CAPILLARY: Glucose-Capillary: 118 mg/dL — ABNORMAL HIGH (ref 65–99)

## 2015-11-22 MED ORDER — SODIUM CHLORIDE 0.9 % IV SOLN
Freq: Once | INTRAVENOUS | Status: AC
  Administered 2015-11-22: 16:00:00 via INTRAVENOUS

## 2015-11-22 MED ORDER — APIXABAN 5 MG PO TABS
5.0000 mg | ORAL_TABLET | Freq: Two times a day (BID) | ORAL | Status: DC
  Administered 2015-11-22 – 2015-11-24 (×4): 5 mg via ORAL
  Filled 2015-11-22 (×4): qty 1

## 2015-11-22 MED ORDER — SODIUM CHLORIDE 0.9 % IV SOLN
Freq: Once | INTRAVENOUS | Status: AC
  Administered 2015-11-22: 13:00:00 via INTRAVENOUS

## 2015-11-22 MED ORDER — ROSUVASTATIN CALCIUM 20 MG PO TABS
40.0000 mg | ORAL_TABLET | Freq: Every day | ORAL | Status: DC
  Administered 2015-11-23 – 2015-11-24 (×2): 40 mg via ORAL
  Filled 2015-11-22 (×3): qty 2

## 2015-11-22 MED ORDER — DEXTROSE 5 % IV SOLN
1.0000 g | INTRAVENOUS | Status: DC
  Administered 2015-11-22 – 2015-11-23 (×2): 1 g via INTRAVENOUS
  Filled 2015-11-22 (×3): qty 10

## 2015-11-22 MED ORDER — ONDANSETRON HCL 4 MG PO TABS: 4.0000 mg | ORAL_TABLET | Freq: Four times a day (QID) | ORAL | Status: DC | PRN

## 2015-11-22 MED ORDER — INSULIN ASPART 100 UNIT/ML ~~LOC~~ SOLN
0.0000 [IU] | Freq: Three times a day (TID) | SUBCUTANEOUS | Status: DC
  Filled 2015-11-22: qty 2

## 2015-11-22 MED ORDER — ACETAMINOPHEN 325 MG PO TABS
650.0000 mg | ORAL_TABLET | Freq: Four times a day (QID) | ORAL | Status: DC | PRN
  Administered 2015-11-23: 650 mg via ORAL
  Filled 2015-11-22: qty 2

## 2015-11-22 MED ORDER — LEVOTHYROXINE SODIUM 112 MCG PO TABS
112.0000 ug | ORAL_TABLET | Freq: Every day | ORAL | Status: DC
  Administered 2015-11-23 – 2015-11-24 (×2): 112 ug via ORAL
  Filled 2015-11-22 (×2): qty 1

## 2015-11-22 MED ORDER — METOPROLOL SUCCINATE ER 50 MG PO TB24
50.0000 mg | ORAL_TABLET | Freq: Every day | ORAL | Status: DC
  Administered 2015-11-23 – 2015-11-24 (×2): 50 mg via ORAL
  Filled 2015-11-22 (×2): qty 1

## 2015-11-22 MED ORDER — ONDANSETRON HCL 4 MG/2ML IJ SOLN: 4.0000 mg | Freq: Four times a day (QID) | INTRAMUSCULAR | Status: DC | PRN

## 2015-11-22 MED ORDER — INSULIN ASPART 100 UNIT/ML ~~LOC~~ SOLN
0.0000 [IU] | Freq: Every day | SUBCUTANEOUS | Status: DC
Start: 2015-11-22 — End: 2015-11-24

## 2015-11-22 MED ORDER — PANTOPRAZOLE SODIUM 40 MG PO TBEC
40.0000 mg | DELAYED_RELEASE_TABLET | Freq: Every day | ORAL | Status: DC
  Administered 2015-11-23: 40 mg via ORAL
  Filled 2015-11-22: qty 1

## 2015-11-22 MED ORDER — ALBUTEROL SULFATE (2.5 MG/3ML) 0.083% IN NEBU: 2.5000 mg | INHALATION_SOLUTION | RESPIRATORY_TRACT | Status: DC | PRN

## 2015-11-22 MED ORDER — MIRABEGRON ER 25 MG PO TB24
25.0000 mg | ORAL_TABLET | Freq: Every day | ORAL | Status: DC
  Administered 2015-11-22 – 2015-11-23 (×2): 25 mg via ORAL
  Filled 2015-11-22 (×5): qty 1

## 2015-11-22 MED ORDER — HYDROXYZINE HCL 10 MG PO TABS: 10.0000 mg | ORAL_TABLET | Freq: Three times a day (TID) | ORAL | Status: DC | PRN

## 2015-11-22 MED ORDER — ALBUTEROL SULFATE HFA 108 (90 BASE) MCG/ACT IN AERS: 1.0000 | INHALATION_SPRAY | RESPIRATORY_TRACT | Status: DC | PRN

## 2015-11-22 MED ORDER — NITROGLYCERIN 0.4 MG SL SUBL: 0.4000 mg | SUBLINGUAL_TABLET | SUBLINGUAL | Status: DC | PRN

## 2015-11-22 MED ORDER — SODIUM CHLORIDE 0.9 % IV SOLN
INTRAVENOUS | Status: DC
  Administered 2015-11-22 – 2015-11-23 (×2): via INTRAVENOUS

## 2015-11-22 MED ORDER — ACETAMINOPHEN 650 MG RE SUPP
650.0000 mg | Freq: Four times a day (QID) | RECTAL | Status: DC | PRN
Start: 2015-11-22 — End: 2015-11-24

## 2015-11-22 NOTE — ED Provider Notes (Signed)
[facility] Emergency Department Provider Note        Time seen: ----------------------------------------- 1:00 PM on 11/22/2015 -----------------------------------------    I have reviewed the triage vital signs and the nursing notes.   HISTORY  Chief Complaint Fall and Dizziness    HPI Tammy [name] is a 70 y.o. female who presents the ER for dizziness and blurred vision since Thursday. Patient states she fell Thursday and hit her head. She denies loss of consciousness but states she is on Eliquis for history of PE but has not taken it today. She denies recent illness.Patient states symptoms are worse when she is up walking around, she drinks plenty of fluids but she has not had a good appetite.   Past Medical History:  Diagnosis Date  . Asthma   . Benign neoplasm of colon   . Cervical disc disease    "bulging" - no limitations per pt  . Chronic diastolic CHF (congestive heart failure) ([place])    a. 07/2012 Echo: Nl EF. mild inferoseptal HK, Gr 2 DD, mild conc LVH, mild PR/TR, mild PAH.  . CKD (chronic kidney disease), stage III   . COPD (chronic obstructive pulmonary disease) ([place])   . Coronary artery disease    a. 11/2011 Cath: LAD 61md, LCX min irregs, RCA 70p, 1044mith L->R collats, EF 60%-->Med Rx.  . Diabetes mellitus without complication (HCDavis  . GERD (gastroesophageal reflux disease)   . History of DVT (deep vein thrombosis)   . History of tobacco abuse    a. Quit 2011.  . Marland Kitchenyperlipidemia   . Hypertension   . Hypothyroidism   . Obesity   . Palpitations   . PE (pulmonary embolism) 10/15  . PONV (postoperative nausea and vomiting)   . PVD (peripheral vascular disease) (HCBuena [place]   a. PTA of right leg and left femoral artery with stenosis.  . Stroke (HCSargeant   TIAs - no deficits    Patient Active Problem List   Diagnosis Date Noted  . Medication monitoring encounter 05/26/2015  . Benign neoplasm of sigmoid colon   . Noninfectious diarrhea    . Benign neoplasm of cecum   . Benign neoplasm of ascending colon   . Bilateral atelectasis 04/11/2015  . Fatty liver disease, nonalcoholic 0124/23/5361. Hydronephrosis of right kidney 04/11/2015  . Abdominal aortic ectasia (HCGarden [place]01/29/2017  . Abdominal aortic atherosclerosis (HCFederal [place]10/09/2014  . Diverticulosis of colon 12/18/2014  . Angina pectoris (HCMentone09/09/2014  . Rhinitis, allergic 10/20/2014  . Coronary artery disease   . Hypertension   . Obesity   . Spinal stenosis of lumbar region 06/06/2014  . Herniated nucleus pulposus 06/06/2014  . Neuralgia neuritis, sciatic nerve 05/24/2014  . Acute pulmonary embolism (HCBurgess11/20/2015  . Essential hypertension 12/16/2013  . Diabetes mellitus type 2 with complications, uncontrolled (HCEidson Road10/08/2013  . Chronic renal insufficiency, stage III (moderate) 12/16/2013  . Frequent UTI 11/03/2013  . Detrusor muscle hypertonia 11/03/2013  . Coronary atherosclerosis of native coronary artery 06/19/2013  . Back pain 06/19/2013  . History of back surgery 06/19/2013  . Hyperlipidemia 06/19/2013  . PAD (peripheral artery disease) (HCChicken04/11/2013  . Congenital obstruction of ureteropelvic junction 11/21/2011    Past Surgical History:  Procedure Laterality Date  . ANGIOPLASTY / STENTING FEMORAL     PVD; angioplasty right leg and left femoral artery with stenosis.   . APPENDECTOMY  Oct. 2016  . BACK SURGERY  03/2012  . CARDIAC CATHETERIZATION  12/07/2011  Mid LAD 50%, distal LAD 50%, mid RCA 70%, distal RCA 100%   . CARDIAC CATHETERIZATION  01/04/2011   100% occluded mid RCA with good collaterals from distal LAD, normal LVEF.  [name] CHOLECYSTECTOMY    . COLONOSCOPY  2013  . COLONOSCOPY WITH PROPOFOL N/A 05/24/2015   Procedure: COLONOSCOPY WITH PROPOFOL;  Surgeon: [name], MD;  Location: [place];  Service: Endoscopy;  Laterality: N/A;  Diabetic - oral meds  . LAPAROSCOPIC APPENDECTOMY N/A 01/06/2015   Procedure: APPENDECTOMY  LAPAROSCOPIC;  Surgeon: [name], MD;  Location: [facility];  Service: General;  Laterality: N/A;  . POLYPECTOMY  05/24/2015   Procedure: POLYPECTOMY;  Surgeon: [name], MD;  Location: [place];  Service: Endoscopy;;  . THYROIDECTOMY    . TRIGGER FINGER RELEASE      Allergies Aleve [naproxen sodium]; Contrast media [iodinated diagnostic agents]; Ioxaglate; Oxycodone; Ranexa [ranolazine]; and Tramadol  Social History Social History  Substance Use Topics  . Smoking status: Former Smoker    Packs/day: 0.50    Years: 15.00    Types: Cigarettes    Quit date: 03/13/2009  . Smokeless tobacco: Never Used  . Alcohol use No    Review of Systems Constitutional: Negative for fever. Eyes: Positive for blurry vision Cardiovascular: Negative for chest pain. Respiratory: Negative for shortness of breath. Gastrointestinal: Negative for abdominal pain, vomiting and diarrhea. Genitourinary: Negative for dysuria. Musculoskeletal: Negative for back pain. Skin: Negative for rash. Neurological:Positive for headache, dizziness  10-point ROS otherwise negative.  ____________________________________________   PHYSICAL EXAM:  VITAL SIGNS: ED Triage Vitals  Enc Vitals Group     BP 11/22/15 1119 118/66     Pulse Rate 11/22/15 1119 (!) 59     Resp 11/22/15 1119 18     Temp 11/22/15 1119 98.2 F (36.8 C)     Temp Source 11/22/15 1119 Oral     SpO2 11/22/15 1119 94 %     Weight 11/22/15 1120 210 lb (95.3 kg)     Height 11/22/15 1120 '5\' 6"'$  (1.676 m)     Head Circumference --      Peak Flow --      Pain Score 11/22/15 1120 3     Pain Loc --      Pain Edu? --      Excl. in [place]? --     Constitutional: Alert and oriented. Well appearing and in no distress. Eyes: Conjunctivae are normal. PERRL. Normal extraocular movements. ENT   Head: Normocephalic and atraumatic.   Nose: No congestion/rhinnorhea.   Mouth/Throat: Mucous membranes are moist.   Neck: No  stridor. Cardiovascular: Normal rate, regular rhythm. No murmurs, rubs, or gallops. Respiratory: Normal respiratory effort without tachypnea nor retractions. Breath sounds are clear and equal bilaterally. No wheezes/rales/rhonchi. Gastrointestinal: Soft and nontender. Normal bowel sounds Musculoskeletal: Nontender with normal range of motion in all extremities. No lower extremity tenderness nor edema. Neurologic:  Normal speech and language. No gross focal neurologic deficits are appreciated.  Skin:  Skin is warm, dry and intact. No rash noted. Psychiatric: Mood and affect are normal. Speech and behavior are normal.  ____________________________________________  EKG: Interpreted by me. Sinus bradycardia with rate of 58 bpm, normal PR interval, normal QRS size, normal QT interval, left axis deviation. Possible septal infarct age indeterminate.  ____________________________________________  ED COURSE:  Pertinent labs & imaging results that were available during my care of the patient were reviewed by me and considered in my medical decision making (see chart for  details). Clinical Course  Patient is in no distress, describes dizziness. We will check basic labs and give IV fluid.  Procedures ____________________________________________   LABS (pertinent positives/negatives)  Labs Reviewed  BASIC METABOLIC PANEL - Abnormal; Notable for the following:       Result Value   Sodium 134 (*)    CO2 21 (*)    Glucose, Bld 161 (*)    BUN 34 (*)    Creatinine, Ser 2.29 (*)    Calcium 8.3 (*)    GFR calc non Af Amer 20 (*)    GFR calc Af Amer 23 (*)    All other components within normal limits  CBC - Abnormal; Notable for the following:    WBC 11.5 (*)    All other components within normal limits  URINALYSIS COMPLETEWITH MICROSCOPIC ([facility] ONLY)  BASIC METABOLIC PANEL  CBG MONITORING, ED  ____________________________________________  FINAL ASSESSMENT AND PLAN  Dizziness, acute renal  insufficiency  Plan: Patient with labs and imaging as dictated above. Patient was given a normal saline bolus, And we have repeated her basic metabolic panel. She is stable for outpatient follow-up with her doctor.   Earleen Newport, MD   Note: This dictation was prepared with Dragon dictation. Any transcriptional errors that result from this process are unintentional    Earleen Newport, MD 11/22/15 [phone]

## 2015-11-22 NOTE — ED Notes (Signed)
Per MD awaiting lab results prior to discharge

## 2015-11-22 NOTE — ED Notes (Signed)
Pt attempted to give urine sample but missed the urine hat - will attempt again after fluids infused

## 2015-11-22 NOTE — ED Notes (Signed)
Pt reports that she fell last Thursday and hit head - c/o dizziness and blurred vision with some difficulty remembering things - pt states at time of fall she did vomit but since then denies vomiting/nausea

## 2015-11-22 NOTE — ED Notes (Signed)
Lab called and stated green top tube was clotted and needed recollected - redrawn at this time

## 2015-11-22 NOTE — Telephone Encounter (Signed)
I spoke with patient On Thursday night, she had chills and fevers all night Then the next morning, she got up and it was like her legs were rubber, then she fell; she hit her head, then she couldn't get up, posturing backwards She has not been to the ER She has been about to freeze; last night she was freezing No cough and no burning with urination She is still weak She does not even remember talking to the granddaughter No weakness of one arm or leg She was seeing double; just seeing one of everything now, but was seeing double of everything yesterday I advised her to go to the ER I offered to call 911, but she declined; I offered to call 911 for her again and she declined Husband's cell phone: [phone] ------------------------------------- I spoke with husband; advised him to call 911; she is unsteady on her feet; he is pulling in the driveway now; he will take her to [place] now -------------------------------------- I called ER, gave report

## 2015-11-22 NOTE — ED Triage Notes (Signed)
States dizziness and blurred vision since Thursday, states she fell on Thursday and hit her head, denies any LOC, pt states she is on eliquis for hx of PE but has been out of eliquis since Saturday

## 2015-11-22 NOTE — ED Provider Notes (Signed)
-----------------------------------------   3:35 PM on 11/22/2015 -----------------------------------------   Blood pressure 136/71, pulse 67, temperature 98.2 F (36.8 C), temperature source Oral, resp. rate 15, height '5\' 6"'$  (1.676 m), weight 210 lb (95.3 kg), SpO2 99 %.  Assuming care from Dr. Jimmye Norman of [facility] is a 70 y.o. female with a chief complaint of Fall and Dizziness .    I was asked to follow the results of the repeat BMP after IVF and to dc if patient felt improved and creatinine was trending down.   _________________________ 3:53 PM on 11/22/2015 -----------------------------------------  Patient's BMP is trending up from 2.29 to 2.31 after a liter of normal saline. Patient tells me that she only has one functioning kidney due to congenital abnormalities of her right kidney. Therefore since her creatinine is not improved after IVF and patient has only one functioning kidney, will admit for IV hydration. Will start patient on maintenance fluids at 125cc/hr and discuss with hospitalist.   Rudene Re, MD 11/22/15 [phone]

## 2015-11-22 NOTE — H&P (Addendum)
[place] [facility] [place] NAME: Tammy [name]    MR#:  [id number]  DATE OF BIRTH:  Nov 26, 1945  DATE OF ADMISSION:  11/22/2015  PRIMARY CARE PHYSICIAN: Enid Derry, MD   REQUESTING/REFERRING PHYSICIAN: Earleen Newport, MD  CHIEF COMPLAINT:   Chief Complaint  Patient presents with  . Fall  . Dizziness   Dizziness, weakness and poor oral intake take for one week. HISTORY OF PRESENT ILLNESS:  Tammy [name]  is a 70 y.o. female with a known history of Hypertension, diabetes, CKD 3, chronic diastolic CHF and COPD. He shouldn't present in the ED with the dizziness, generalized weakness and poor oral intake for the past one week. Patient fell last Thursday and head her head. She denies any loss of consciousness or syncope. She denies any chest pain, palpitation or leg swelling. She said she only has the one kidney. She was found to have worsening creatinine at 2.31 today. She also complains of right flank pain, fever and chills, but no dysuria hematuria or urinary urgency. PAST MEDICAL HISTORY:   Past Medical History:  Diagnosis Date  . Asthma   . Benign neoplasm of colon   . Cervical disc disease    "bulging" - no limitations per pt  . Chronic diastolic CHF (congestive heart failure) ([name])    a. 07/2012 Echo: Nl EF. mild inferoseptal HK, Gr 2 DD, mild conc LVH, mild PR/TR, mild PAH.  . CKD (chronic kidney disease), stage III   . COPD (chronic obstructive pulmonary disease) ([place])   . Coronary artery disease    a. 11/2011 Cath: LAD 32md, LCX min irregs, RCA 70p, 1047mith L->R collats, EF 60%-->Med Rx.  . Diabetes mellitus without complication (HCStanton  . GERD (gastroesophageal reflux disease)   . History of DVT (deep vein thrombosis)   . History of tobacco abuse    a. Quit 2011.  . Marland Kitchenyperlipidemia   . Hypertension   . Hypothyroidism   . Obesity   . Palpitations   . PE (pulmonary embolism) 10/15  . PONV (postoperative nausea and vomiting)   . PVD  (peripheral vascular disease) (HCMountain View   a. PTA of right leg and left femoral artery with stenosis.  . Stroke (HCLilesville   TIAs - no deficits    PAST SURGICAL HISTORY:   Past Surgical History:  Procedure Laterality Date  . ANGIOPLASTY / STENTING FEMORAL     PVD; angioplasty right leg and left femoral artery with stenosis.   . APPENDECTOMY  Oct. 2016  . BACK SURGERY  03/2012  . CARDIAC CATHETERIZATION  12/07/2011   Mid LAD 50%, distal LAD 50%, mid RCA 70%, distal RCA 100%   . CARDIAC CATHETERIZATION  01/04/2011   100% occluded mid RCA with good collaterals from distal LAD, normal LVEF.  . Marland KitchenHOLECYSTECTOMY    . COLONOSCOPY  2013  . COLONOSCOPY WITH PROPOFOL N/A 05/24/2015   Procedure: COLONOSCOPY WITH PROPOFOL;  Surgeon: DaLucilla LameMD;  [place] Service: [place]oscopy;  Laterality: N/A;  Diabetic - oral meds  . LAPAROSCOPIC APPENDECTOMY N/A 01/06/2015   Procedure: APPENDECTOMY LAPAROSCOPIC;  Surgeon: SeChristene LyeMD;  Location: [facility];  Service: General;  Laterality: N/A;  . POLYPECTOMY  05/24/2015   Procedure: POLYPECTOMY;  Surgeon: DaLucilla LameMD;  [place] Service: Endoscopy;;  . THYROIDECTOMY    . TRIGGER FINGER RELEASE      SOCIAL HISTORY:   Social History  Substance Use Topics  . Smoking status: Former Smoker    Packs/day: 0.50    Years: 15.00    Types: Cigarettes    Quit date: 03/13/2009  . Smokeless tobacco: Never Used  . Alcohol use No    FAMILY HISTORY:   Family History  Problem Relation Age of Onset  . Hypertension Father   . Heart disease Father   . Heart attack Father   . Diabetes Father   . Heart attack Brother   . Diabetes Brother   . Hypertension Brother   . Heart disease Brother   . Heart attack Brother   . Diabetes Brother   . Hypertension Brother   . Heart disease Brother   . Heart attack Brother   . Diabetes Brother   . Hypertension Brother   . Heart disease Brother   . Cancer Mother      colon  . Cancer Sister     lung  . COPD Sister   . COPD Sister     DRUG ALLERGIES:   Allergies  Allergen Reactions  . Aleve [Naproxen Sodium]     Hives & itching   . Contrast Media [Iodinated Diagnostic Agents]     Pt avoids due to kidney issues  . Ioxaglate     Pt avoids due to kidney issues  . Oxycodone Other (See Comments)    hallucinations  . Ranexa [Ranolazine]     Sick on stomach  . Tramadol Other (See Comments)    nightmares    REVIEW OF SYSTEMS:   Review of Systems  Constitutional: Positive for chills, fever and malaise/fatigue.  HENT: Negative for sore throat.   Eyes: Positive for blurred vision. Negative for double vision.  Respiratory: Negative for cough, shortness of breath, wheezing and stridor.   Cardiovascular: Negative for chest pain, palpitations and leg swelling.  Gastrointestinal: Positive for nausea. Negative for abdominal pain, blood in stool, diarrhea, melena and vomiting.       Per oral take and appetite.  Genitourinary: Positive for flank pain. Negative for dysuria, frequency, hematuria and urgency.  Musculoskeletal: Negative for back pain and joint pain.  Skin: Negative for itching and rash.  Neurological: Positive for weakness. Negative for dizziness, tingling, focal weakness, seizures, loss of consciousness and headaches.  Psychiatric/Behavioral: Negative for depression.    MEDICATIONS AT HOME:   Prior to Admission medications   Medication Sig Start Date End Date Taking? Authorizing Provider  ELIQUIS 5 MG TABS tablet TAKE ONE TABLET BY MOUTH TWICE DAILY 02/15/15  Yes [name], MD  fluocinonide (LIDEX) 0.05 % external solution Reported on 05/18/2015 02/15/15  Yes Historical Provider, MD  gabapentin (NEURONTIN) 300 MG capsule Take one pill by mouth in the morning if needed, and take pills by mouth at bedtime Patient taking differently: Take one pill by mouth in the morning as needed, 2 at night 06/24/15  Yes Arnetha Courser, MD  glipiZIDE  (GLUCOTROL) 5 MG tablet Take 1 tablet (5 mg total) by mouth daily before breakfast. 10/12/15  Yes Arnetha Courser, MD  hydrOXYzine (ATARAX/VISTARIL) 10 MG tablet TAKE ONE TABLET BY MOUTH 3 TIMES DAILY AS NEEDED FOR ITCHING 11/16/15  Yes Arnetha Courser, MD  isosorbide mononitrate (IMDUR) 60 MG 24 hr tablet TAKE TWO TABLETS EVERY DAY Patient taking differently: TAKE 0.5 TABLETS in AM and 1 tablet at night daily 02/03/15  Yes [name], MD  losartan (COZAAR) 50 MG tablet TAKE 1 TABLET BY MOUTH DAILY 08/02/15  Yes Christia Reading  Gloriajean Dell, MD  metoprolol succinate (TOPROL-XL) 50 MG 24 hr tablet TAKE ONE TABLET EVERY DAY 12/28/14  Yes [name], MD  mirabegron ER (MYRBETRIQ) 25 MG TB24 tablet Take 25 mg by mouth daily.   Yes Historical Provider, MD  nitroGLYCERIN (NITROSTAT) 0.4 MG SL tablet Place 1 tablet (0.4 mg total) under the tongue every 5 (five) minutes as needed for chest pain. 05/12/14  Yes [name], MD  pantoprazole (PROTONIX) 40 MG tablet Take 40 mg by mouth at bedtime.    Yes Historical Provider, MD  PROAIR HFA 108 (90 Base) MCG/ACT inhaler INHALE 2 PUFFS EVERY 4 HOURS AS NEEDED FOR WHEEZING OR SHORTNESS OF BREATH 08/12/15  Yes Arnetha Courser, MD  rosuvastatin (CRESTOR) 40 MG tablet Take 1 tablet (40 mg total) by mouth daily. 06/16/15  Yes [name], MD  SYNTHROID 112 MCG tablet Take 112 mcg by mouth daily before breakfast.  04/08/13  Yes Historical Provider, MD  trimethoprim (TRIMPEX) 100 MG tablet Take 100 mg by mouth. 09/02/15  Yes Historical Provider, MD  blood glucose meter kit and supplies KIT Dispense based on patient and insurance preference. Use one time daily as directed, E11.9 04/23/15   Arnetha Courser, MD      VITAL SIGNS:  Blood pressure 136/71, pulse 67, temperature 98.2 F (36.8 C), temperature source Oral, resp. rate 15, height 5' 6"  (1.676 m), weight 210 lb (95.3 kg), SpO2 99 %.  PHYSICAL EXAMINATION:  Physical Exam  GENERAL:  70 y.o.-year-old patient lying in  the bed with no acute distress.Obese.  EYES: Pupils equal, round, reactive to light and accommodation. No scleral icterus. Extraocular muscles intact.  HEENT: Head atraumatic, normocephalic. Oropharynx and nasopharynx clear.  NECK:  Supple, no jugular venous distention. No thyroid enlargement, no tenderness.  LUNGS: Normal breath sounds bilaterally, no wheezing, rales,rhonchi or crepitation. No use of accessory muscles of respiration.  CARDIOVASCULAR: S1, S2 normal. No murmurs, rubs, or gallops.  ABDOMEN: Soft, nontender, nondistended. Bowel sounds present. No organomegaly or mass. Right flank tenderness. EXTREMITIES: No pedal edema, cyanosis, or clubbing.  NEUROLOGIC: Cranial nerves II through XII are intact. Muscle strength 4/5 in all extremities. Sensation intact. Gait not checked.  PSYCHIATRIC: The patient is alert and oriented x 3.  SKIN: No obvious rash, lesion, or ulcer.   LABORATORY PANEL:   CBC  Recent Labs Lab 11/22/15 1122  WBC 11.5*  HGB 12.9  HCT 38.7  PLT 258   ------------------------------------------------------------------------------------------------------------------  Chemistries   Recent Labs Lab 11/22/15 1430  NA 136  K 4.9  CL 107  CO2 24  GLUCOSE 118*  BUN 33*  CREATININE 2.31*  CALCIUM 7.8*   ------------------------------------------------------------------------------------------------------------------  Cardiac Enzymes No results for input(s): TROPONINI in the last 168 hours. ------------------------------------------------------------------------------------------------------------------  RADIOLOGY:  Ct Head Wo Contrast  Result Date: 11/22/2015 CLINICAL DATA:  69 year old female with fevers and chills 3 days ago. Next morning abnormal sensation in legs. Initial encounter. EXAM: CT HEAD WITHOUT CONTRAST TECHNIQUE: Contiguous axial images were obtained from the base of the skull through the vertex without intravenous contrast. COMPARISON:   02/26/2013 brain MR. FINDINGS: Brain: Encephalomalacia anterior left frontal lobe unchanged consistent with result of prior insult (infarct or trauma). Chronic microvascular changes without CT evidence of large acute infarct. Mild global atrophy without hydrocephalus. No intracranial mass lesion noted on this unenhanced exam. Vascular: Carotid calcification. Skull: Negative. Sinuses/Orbits: Negative. Other: Negative. IMPRESSION: No acute intracranial abnormality. Encephalomalacia anterior left frontal lobe unchanged and may reflect result  of prior infarct or trauma. Chronic microvascular changes. Mild atrophy. Electronically Signed   By: Genia Del M.D.   On: 11/22/2015 11:58      IMPRESSION AND PLAN:   Acute renal failure on CKD. The patient will be admitted to medical floor. Hold losartan, give IV fluid gentle rehydration, follow-up BMP.  UTI. Start Rocephin and a follow-up urine culture. Chronic diastolic CHF. Stable. Hypertension. Low side blood pressure. Hold hypertension medication except lopressor. Diabetes. Hold glipizide, start sliding scale.  All the records are reviewed and case discussed with ED provider. Management plans discussed with the patient, Her husband and they are in agreement.  CODE STATUS: Full code  TOTAL TIME TAKING CARE OF THIS PATIENT: 53 minutes.    Demetrios Loll M.D on 11/22/2015 at 4:52 PM  Between 7am to 6pm - Pager - [phone]  After 6pm go to [email or web address] - Proofreader  [facility]  Office  [phone]  CC: Primary care physician; Enid Derry, MD   Note: This dictation was prepared with Dragon dictation along with smaller phrase technology. Any transcriptional errors that result from this process are unintentional.

## 2015-11-22 NOTE — Telephone Encounter (Signed)
Patient called wanting to speak with Dr. Sanda Klein regarding his wife Tammy [name]  falling over the weekend.  He wanted to schedule to have his wife see Dr. Sanda Klein on today, but was informed that she didn't have any availability and that we could get her in on tomorrow, Tuesday 11/23/15 but he didn't want to take that appointment and that he wanted her to be seen today.  Tammy [name] asked to speak with nurse and Dr. Sanda Klein but they were both in the room with patients and he stated that he would hold.  The call was disconnected twice due to phone issues that we are having and patient's called back stating that he didn't want to hold anymore and that he wanted to speak with Dr. Sanda Klein immediately.  I informed in that I apologize with our phone issues and that Dr. Sanda Klein was still in a room with a patient and that I would pass the message along to her. Husband was still very upset because he couldn't speak with Dr. Sanda Klein at that moment.

## 2015-11-23 ENCOUNTER — Other Ambulatory Visit: Payer: Self-pay | Admitting: *Deleted

## 2015-11-23 LAB — CBC
HCT: 35.4 % (ref 35.0–47.0)
Hemoglobin: 12 g/dL (ref 12.0–16.0)
MCH: 29.7 pg (ref 26.0–34.0)
MCHC: 33.8 g/dL (ref 32.0–36.0)
MCV: 87.9 fL (ref 80.0–100.0)
Platelets: 223 10*3/uL (ref 150–440)
RBC: 4.03 MIL/uL (ref 3.80–5.20)
RDW: 13.7 % (ref 11.5–14.5)
WBC: 11.8 10*3/uL — ABNORMAL HIGH (ref 3.6–11.0)

## 2015-11-23 LAB — BASIC METABOLIC PANEL
Anion gap: 8 (ref 5–15)
BUN: 29 mg/dL — ABNORMAL HIGH (ref 6–20)
CO2: 20 mmol/L — ABNORMAL LOW (ref 22–32)
Calcium: 7.8 mg/dL — ABNORMAL LOW (ref 8.9–10.3)
Chloride: 110 mmol/L (ref 101–111)
Creatinine, Ser: 2.12 mg/dL — ABNORMAL HIGH (ref 0.44–1.00)
GFR calc Af Amer: 26 mL/min — ABNORMAL LOW (ref >60)
GFR calc non Af Amer: 22 mL/min — ABNORMAL LOW (ref >60)
Glucose, Bld: 151 mg/dL — ABNORMAL HIGH (ref 65–99)
Potassium: 4.5 mmol/L (ref 3.5–5.1)
Sodium: 138 mmol/L (ref 135–145)

## 2015-11-23 LAB — GLUCOSE, CAPILLARY
Glucose-Capillary: 179 mg/dL — ABNORMAL HIGH (ref 65–99)
Glucose-Capillary: 145 mg/dL — ABNORMAL HIGH (ref 65–99)
Glucose-Capillary: 134 mg/dL — ABNORMAL HIGH (ref 65–99)
Glucose-Capillary: 109 mg/dL — ABNORMAL HIGH (ref 65–99)

## 2015-11-23 MED ORDER — GLIPIZIDE 5 MG PO TABS
5.0000 mg | ORAL_TABLET | Freq: Every day | ORAL | Status: DC
  Administered 2015-11-24: 5 mg via ORAL
  Filled 2015-11-23: qty 1

## 2015-11-23 MED ORDER — INFLUENZA VAC SPLIT QUAD 0.5 ML IM SUSY
0.5000 mL | PREFILLED_SYRINGE | INTRAMUSCULAR | Status: AC
  Administered 2015-11-24: 0.5 mL via INTRAMUSCULAR
  Filled 2015-11-23: qty 0.5

## 2015-11-23 NOTE — Progress Notes (Signed)
[facility] [place] NAME: Tammy [name]    MR#:  [id number]  DATE OF BIRTH:  1945/07/01  SUBJECTIVE:   Patient here due to dizziness and noted to be in acute on chronic renal failure. Patient had a fall last week but denies any LOC. Feels better today and husband at bedside. Renal function slightly improved.  REVIEW OF SYSTEMS:    Review of Systems  Constitutional: Negative for chills and fever.  HENT: Negative for congestion and tinnitus.   Eyes: Negative for blurred vision and double vision.  Respiratory: Negative for cough, shortness of breath and wheezing.   Cardiovascular: Negative for chest pain, orthopnea and PND.  Gastrointestinal: Negative for abdominal pain, diarrhea, nausea and vomiting.  Genitourinary: Negative for dysuria and hematuria.  Neurological: Negative for dizziness, sensory change and focal weakness.  All other systems reviewed and are negative.   Nutrition: Heart Healthy/Carb control Tolerating Diet: yes Tolerating PT: Await Eval.    DRUG ALLERGIES:   Allergies  Allergen Reactions  . Aleve [Naproxen Sodium]     Hives & itching   . Contrast Media [Iodinated Diagnostic Agents]     Pt avoids due to kidney issues  . Ioxaglate     Pt avoids due to kidney issues  . Oxycodone Other (See Comments)    hallucinations  . Ranexa [Ranolazine]     Sick on stomach  . Tramadol Other (See Comments)    nightmares    VITALS:  Blood pressure 132/60, pulse 72, temperature 97.8 F (36.6 C), temperature source Oral, resp. rate 18, height '5\' 6"'$  (1.676 m), weight 98.2 kg (216 lb 7.9 oz), SpO2 96 %.  PHYSICAL EXAMINATION:   Physical Exam  GENERAL:  70 y.o.-year-old patient lying in the bed in no acute distress.  EYES: Pupils equal, round, reactive to light and accommodation. No scleral icterus. Extraocular muscles intact.  HEENT: Head atraumatic, normocephalic. Oropharynx and nasopharynx clear.  NECK:  Supple, no jugular  venous distention. No thyroid enlargement, no tenderness.  LUNGS: Normal breath sounds bilaterally, no wheezing, rales, rhonchi. No use of accessory muscles of respiration.  CARDIOVASCULAR: S1, S2 normal. No murmurs, rubs, or gallops.  ABDOMEN: Soft, nontender, nondistended. Bowel sounds present. No organomegaly or mass.  EXTREMITIES: No cyanosis, clubbing or edema b/l.    NEUROLOGIC: Cranial nerves II through XII are intact. No focal Motor or sensory deficits b/l.   PSYCHIATRIC: The patient is alert and oriented x 3.  SKIN: No obvious rash, lesion, or ulcer.    LABORATORY PANEL:   CBC  Recent Labs Lab 11/23/15 0506  WBC 11.8*  HGB 12.0  HCT 35.4  PLT 223   ------------------------------------------------------------------------------------------------------------------  Chemistries   Recent Labs Lab 11/22/15 1822 11/23/15 0506  NA  --  138  K  --  4.5  CL  --  110  CO2  --  20*  GLUCOSE  --  151*  BUN  --  29*  CREATININE  --  2.12*  CALCIUM  --  7.8*  MG 2.1  --    ------------------------------------------------------------------------------------------------------------------  Cardiac Enzymes No results for input(s): TROPONINI in the last 168 hours. ------------------------------------------------------------------------------------------------------------------  RADIOLOGY:  Ct Head Wo Contrast  Result Date: 11/22/2015 CLINICAL DATA:  70 year old female with fevers and chills 3 days ago. Next morning abnormal sensation in legs. Initial encounter. EXAM: CT HEAD WITHOUT CONTRAST TECHNIQUE: Contiguous axial images were obtained from the base of the skull through the vertex without intravenous contrast. COMPARISON:  02/26/2013 brain MR. FINDINGS: Brain: Encephalomalacia anterior left frontal lobe unchanged consistent with result of prior insult (infarct or trauma). Chronic microvascular changes without CT evidence of large acute infarct. Mild global atrophy without  hydrocephalus. No intracranial mass lesion noted on this unenhanced exam. Vascular: Carotid calcification. Skull: Negative. Sinuses/Orbits: Negative. Other: Negative. IMPRESSION: No acute intracranial abnormality. Encephalomalacia anterior left frontal lobe unchanged and may reflect result of prior infarct or trauma. Chronic microvascular changes. Mild atrophy. Electronically Signed   By: Genia Del M.D.   On: 11/22/2015 11:58     ASSESSMENT AND PLAN:   70 year old female with past medical history of atrial fibrillation, diabetes, hypertension, PVD, previous history of CVA, hypothyroidism, hyperlipidemia, chronic diastolic CHF who presented to the hospital due to dizziness and noted to be in acute on chronic renal failure.  1. Acute on chronic renal failure-secondary to dehydration. -Continue gentle IV fluids, follow BUN and creatinine. Hold losartan.  2. Urinary tract infection-based off the urinalysis. -Continue IV ceftriaxone, follow urine cultures.  3. Diabetes type 2 without complication-continue glipizide.  4. GERD-continue Protonix.  5. History of atrial fibrillation-rate controlled. Continue Toprol, Eliquis.  6. Hypothyroidism-continue Synthroid.  7. Hyperlipidemia-continue Crestor.  8. Essential hypertension-continue Imdur, Toprol.  All the records are reviewed and case discussed with Care Management/Social Worker. Management plans discussed with the patient, family and they are in agreement.  CODE STATUS: Full  DVT Prophylaxis: Eliquis  TOTAL TIME TAKING CARE OF THIS PATIENT: 30 minutes.   POSSIBLE D/C IN 1-2 DAYS, DEPENDING ON CLINICAL CONDITION.   Henreitta Leber M.D on 11/23/2015 at 2:18 PM  Between 7am to 6pm - Pager - [phone]  After 6pm go to [email or web address] - Proofreader  [facility]  Office  [phone]  CC: Primary care physician; Enid Derry, MD

## 2015-11-23 NOTE — Consult Note (Signed)
   [facility] CM Inpatient Consult   11/23/2015  Tammy [name] 1945-05-18 [id number]  Chart review revealed patient eligible for [facility] services with a diagnosis of DM, (A1c'@9'$ .3 six months ago), CHF, and COPD for post hospital discharge follow up. Patient was evaluated for community based chronic disease management services with [facility] care Management Program as a benefit of patient's Traditional Medicare. Met with the patient at the bedside to explain [facility] services. Patient endorses her  primary care provider to be Dr. Sanda Klein. Patient states she recently had a fall at her home. Consent form signed. She gave [phone] as the best number to reach her. She also gave written permission to speak with her spouse Tammy [name] at [phone]. Patient will receive post hospital discharge calls and be evaluated for monthly home visits. [facility] Care Management services does not interfere with or replace any services arranged by the inpatient care management team. RNCM left contact information and [facility] literature at the bedside. For additional questions please contact:   Kinzey Sheriff RN, [facility] Liaison  [phone]) Business Mobile [phone]) Toll free office

## 2015-11-24 ENCOUNTER — Telehealth: Payer: Self-pay | Admitting: Family Medicine

## 2015-11-24 LAB — BASIC METABOLIC PANEL
Anion gap: 8 (ref 5–15)
BUN: 23 mg/dL — ABNORMAL HIGH (ref 6–20)
CO2: 20 mmol/L — ABNORMAL LOW (ref 22–32)
Calcium: 7.5 mg/dL — ABNORMAL LOW (ref 8.9–10.3)
Chloride: 108 mmol/L (ref 101–111)
Creatinine, Ser: 1.83 mg/dL — ABNORMAL HIGH (ref 0.44–1.00)
GFR calc Af Amer: 31 mL/min — ABNORMAL LOW (ref >60)
GFR calc non Af Amer: 26 mL/min — ABNORMAL LOW (ref >60)
Glucose, Bld: 130 mg/dL — ABNORMAL HIGH (ref 65–99)
Potassium: 4.5 mmol/L (ref 3.5–5.1)
Sodium: 136 mmol/L (ref 135–145)

## 2015-11-24 LAB — GLUCOSE, CAPILLARY: Glucose-Capillary: 146 mg/dL — ABNORMAL HIGH (ref 65–99)

## 2015-11-24 LAB — URINE CULTURE: Culture: >=100000 — AB

## 2015-11-24 MED ORDER — CEFUROXIME AXETIL 250 MG PO TABS: 250.0000 mg | ORAL_TABLET | Freq: Two times a day (BID) | ORAL | 0 refills | Status: DC

## 2015-11-24 NOTE — Telephone Encounter (Signed)
I spoke with patient She is feeling better They said to talk to me about her the antibiotics I encouraged her to take the antibiotics prescribed Klebsiella on culture, then she'll talk to Dr. Bernardo Heater about the prophylactic antibiotics I'll see her in two weeks

## 2015-11-24 NOTE — Care Management (Signed)
Patient admitted with Acute on chronic renal failure-secondary to dehydration and history of fall at home.  Patient lives with her husband.  States that she is independent of ADL's and has no medical equipment.  Patient obtains medication at [facility].  PCP Dr Sanda Klein.  Denies any issues with obtaining medications or transportation.  Patient states "when I fell I just wasn't feeling well and missed the chair".  Home health PT was ordered for patient.  Patient declines any services at home at this time.  RNCM signing off.

## 2015-11-24 NOTE — Care Management Important Message (Signed)
Important Message  Patient Details  Name: TASHEMA TILLER MRN: [id number] Date of Birth: 31-Jan-1946   Medicare Important Message Given:  N/A - LOS <3 / Initial given by admissions    Beverly Sessions, RN 11/24/2015, 11:13 AM

## 2015-11-24 NOTE — Discharge Summary (Signed)
Tammy [name] at [place] NAME: Tammy [name]    MR#:  [id number]  DATE OF BIRTH:  05/20/1945  DATE OF ADMISSION:  11/22/2015 ADMITTING PHYSICIAN: Demetrios Loll, MD  DATE OF DISCHARGE: 11/24/2015 12:19 PM  PRIMARY CARE PHYSICIAN: Enid Derry, MD    ADMISSION DIAGNOSIS:  Dizziness [R42] Renal insufficiency [N28.9] AKI (acute kidney injury) ([place]) [N17.9]  DISCHARGE DIAGNOSIS:  Active Problems:   Renal failure (ARF), acute on chronic ([place])   SECONDARY DIAGNOSIS:   Past Medical History:  Diagnosis Date  . Asthma   . Benign neoplasm of colon   . Cervical disc disease    "bulging" - no limitations per pt  . Chronic diastolic CHF (congestive heart failure) ([place])    a. 07/2012 Echo: Nl EF. mild inferoseptal HK, Gr 2 DD, mild conc LVH, mild PR/TR, mild PAH.  . CKD (chronic kidney disease), stage III   . COPD (chronic obstructive pulmonary disease) ([place])   . Coronary artery disease    a. 11/2011 Cath: LAD 76md, LCX min irregs, RCA 70p, 1063mith L->R collats, EF 60%-->Med Rx.  . Diabetes mellitus without complication ([place]  . GERD (gastroesophageal reflux disease)   . History of DVT (deep vein thrombosis)   . History of tobacco abuse    a. Quit 2011.  . Marland Kitchenyperlipidemia   . Hypertension   . Hypothyroidism   . Obesity   . Palpitations   . PE (pulmonary embolism) 10/15  . PONV (postoperative nausea and vomiting)   . PVD (peripheral vascular disease) ([facility]   a. PTA of right leg and left femoral artery with stenosis.  . Stroke ([place]   TIAs - no deficits    HOSPITAL COURSE:   70 year old female with past medical history of atrial fibrillation, diabetes, hypertension, PVD, previous history of CVA, hypothyroidism, hyperlipidemia, chronic diastolic CHF who presented to the hospital due to dizziness and noted to be in acute on chronic renal failure.  1. Acute on chronic renal failure-This was secondary dehydration, patient was gently hydrated with  IV fluids and her creatinine has improved and is back to baseline around 1.8. -Her antihypertensives were held but she will resume those upon discharge. -She will follow-up with nephrology as an outpatient to have a creatinine checked in the next 2 weeks.  2. Urinary tract infection-based off the urinalysis. Patient's urine cultures grew out rubs or gallop. -While in the hospital patient was given IV ceftriaxone and now being discharged on oral cefuroxime.  3. Diabetes type 2 without complication- she will continue glipizide.  4. GERD- she will continue Protonix.  5. History of atrial fibrillation- she remained rate controlled. She will Continue Toprol, Eliquis.  6. Hypothyroidism- she will continue Synthroid.  7. Hyperlipidemia- she will continue Crestor.  8. Essential hypertension- she will continue Imdur, Toprol.  DISCHARGE CONDITIONS:   Stable  CONSULTS OBTAINED:    DRUG ALLERGIES:   Allergies  Allergen Reactions  . Aleve [Naproxen Sodium]     Hives & itching   . Contrast Media [Iodinated Diagnostic Agents]     Pt avoids due to kidney issues  . Ioxaglate     Pt avoids due to kidney issues  . Oxycodone Other (See Comments)    hallucinations  . Ranexa [Ranolazine]     Sick on stomach  . Tramadol Other (See Comments)    nightmares    DISCHARGE MEDICATIONS:     Medication List    STOP taking these medications  trimethoprim 100 MG tablet Commonly known as:  TRIMPEX     TAKE these medications   blood glucose meter kit and supplies Kit Dispense based on patient and insurance preference. Use one time daily as directed, E11.9   cefUROXime 250 MG tablet Commonly known as:  CEFTIN Take 1 tablet (250 mg total) by mouth 2 (two) times daily with a meal.   ELIQUIS 5 MG Tabs tablet Generic drug:  apixaban TAKE ONE TABLET BY MOUTH TWICE DAILY   fluocinonide 0.05 % external solution Commonly known as:  LIDEX Reported on 05/18/2015   gabapentin 300 MG  capsule Commonly known as:  NEURONTIN Take one pill by mouth in the morning if needed, and take pills by mouth at bedtime What changed:  additional instructions   glipiZIDE 5 MG tablet Commonly known as:  GLUCOTROL Take 1 tablet (5 mg total) by mouth daily before breakfast.   hydrOXYzine 10 MG tablet Commonly known as:  ATARAX/VISTARIL TAKE ONE TABLET BY MOUTH 3 TIMES DAILY AS NEEDED FOR ITCHING   isosorbide mononitrate 60 MG 24 hr tablet Commonly known as:  IMDUR TAKE TWO TABLETS EVERY DAY What changed:  See the new instructions.   losartan 50 MG tablet Commonly known as:  COZAAR TAKE 1 TABLET BY MOUTH DAILY   metoprolol succinate 50 MG 24 hr tablet Commonly known as:  TOPROL-XL TAKE ONE TABLET EVERY DAY   MYRBETRIQ 25 MG Tb24 tablet Generic drug:  mirabegron ER Take 25 mg by mouth daily.   nitroGLYCERIN 0.4 MG SL tablet Commonly known as:  NITROSTAT Place 1 tablet (0.4 mg total) under the tongue every 5 (five) minutes as needed for chest pain.   pantoprazole 40 MG tablet Commonly known as:  PROTONIX Take 40 mg by mouth at bedtime.   PROAIR HFA 108 (90 Base) MCG/ACT inhaler Generic drug:  albuterol INHALE 2 PUFFS EVERY 4 HOURS AS NEEDED FOR WHEEZING OR SHORTNESS OF BREATH   rosuvastatin 40 MG tablet Commonly known as:  CRESTOR Take 1 tablet (40 mg total) by mouth daily.   SYNTHROID 112 MCG tablet Generic drug:  levothyroxine Take 112 mcg by mouth daily before breakfast.         DISCHARGE INSTRUCTIONS:   DIET:  Cardiac diet and Diabetic diet  DISCHARGE CONDITION:  Stable  ACTIVITY:  Activity as tolerated  OXYGEN:  Home Oxygen: No.   Oxygen Delivery: room air  DISCHARGE LOCATION:  home   If you experience worsening of your admission symptoms, develop shortness of breath, life threatening emergency, suicidal or homicidal thoughts you must seek medical attention immediately by calling 911 or calling your MD immediately  if symptoms less  severe.  You Must read complete instructions/literature along with all the possible adverse reactions/side effects for all the Medicines you take and that have been prescribed to you. Take any new Medicines after you have completely understood and accpet all the possible adverse reactions/side effects.   Please note  You were cared for by a hospitalist during your hospital stay. If you have any questions about your discharge medications or the care you received while you were in the hospital after you are discharged, you can call the unit and asked to speak with the hospitalist on call if the hospitalist that took care of you is not available. Once you are discharged, your primary care physician will handle any further medical issues. Please note that NO REFILLS for any discharge medications will be authorized once you are discharged, as it is  imperative that you return to your primary care physician (or establish a relationship with a primary care physician if you do not have one) for your aftercare needs so that they can reassess your need for medications and monitor your lab values.     Today   Feels better. Wants to go home. No other acute events overnight.  VITAL SIGNS:  Blood pressure (!) 146/55, pulse (!) 59, temperature 98.1 F (36.7 C), temperature source Oral, resp. rate 19, height _0  (1.676 m), weight 98.2 kg (216 lb 7.9 oz), SpO2 95 %.  I/O:   Intake/Output Summary (Last 24 hours) at 11/24/15 1418 Last data filed at 11/24/15 0934  Gross per 24 hour  Intake          1618.41 ml  Output              500 ml  Net          1118.41 ml    PHYSICAL EXAMINATION:  GENERAL:  70 y.o.-year-old patient lying in the bed with no acute distress.  EYES: Pupils equal, round, reactive to light and accommodation. No scleral icterus. Extraocular muscles intact.  HEENT: Head atraumatic, normocephalic. Oropharynx and nasopharynx clear.  NECK:  Supple, no jugular venous distention. No thyroid  enlargement, no tenderness.  LUNGS: Normal breath sounds bilaterally, no wheezing, rales,rhonchi. No use of accessory muscles of respiration.  CARDIOVASCULAR: S1, S2 normal. No murmurs, rubs, or gallops.  ABDOMEN: Soft, non-tender, non-distended. Bowel sounds present. No organomegaly or mass.  EXTREMITIES: No pedal edema, cyanosis, or clubbing.  NEUROLOGIC: Cranial nerves II through XII are intact. No focal motor or sensory defecits b/l.  PSYCHIATRIC: The patient is alert and oriented x 3. Good affect.  SKIN: No obvious rash, lesion, or ulcer.   DATA REVIEW:   CBC  Recent Labs Lab 11/23/15 0506  WBC 11.8*  HGB 12.0  HCT 35.4  PLT 223    Chemistries   Recent Labs Lab 11/22/15 1822  11/24/15 0436  NA  --   < > 136  K  --   < > 4.5  CL  --   < > 108  CO2  --   < > 20*  GLUCOSE  --   < > 130*  BUN  --   < > 23*  CREATININE  --   < > 1.83*  CALCIUM  --   < > 7.5*  MG 2.1  --   --   < > = values in this interval not displayed.  Cardiac Enzymes No results for input(s): TROPONINI in the last 168 hours.  Microbiology Results  Results for orders placed or performed during the hospital encounter of 11/22/15  Urine culture     Status: Abnormal   Collection Time: 11/22/15  4:00 PM  Result Value Ref Range Status   Specimen Description URINE, CATHETERIZED  Final   Special Requests NONE  Final   Culture >=100,000 COLONIES/mL KLEBSIELLA PNEUMONIAE (A)  Final   Report Status 11/24/2015 FINAL  Final   Organism ID, Bacteria KLEBSIELLA PNEUMONIAE (A)  Final      Susceptibility   Klebsiella pneumoniae - MIC*    AMPICILLIN 8 RESISTANT Resistant     CEFAZOLIN <=4 SENSITIVE Sensitive     CEFTRIAXONE <=1 SENSITIVE Sensitive     CIPROFLOXACIN <=0.25 SENSITIVE Sensitive     GENTAMICIN <=1 SENSITIVE Sensitive     IMIPENEM <=0.25 SENSITIVE Sensitive     NITROFURANTOIN <=16 SENSITIVE Sensitive     TRIMETH/SULFA <=  20 SENSITIVE Sensitive     AMPICILLIN/SULBACTAM <=2 SENSITIVE Sensitive      PIP/TAZO <=4 SENSITIVE Sensitive     Extended ESBL NEGATIVE Sensitive     * >=100,000 COLONIES/mL KLEBSIELLA PNEUMONIAE    RADIOLOGY:  No results found.    Management plans discussed with the patient, family and they are in agreement.  CODE STATUS:     Code Status Orders        Start     Ordered   11/22/15 1724  Full code  Continuous     11/22/15 1724    Code Status History    Date Active Date Inactive Code Status Order ID Comments User Context   This patient has a current code status but no historical code status.      TOTAL TIME TAKING CARE OF THIS PATIENT: 40 minutes.    Henreitta Leber M.D on 11/24/2015 at 2:18 PM  Between 7am to 6pm - Pager - [phone]  After 6pm go to [email or web address] - Proofreader  [facility]  Office  [phone]  CC: Primary care physician; Enid Derry, MD

## 2015-11-24 NOTE — Progress Notes (Signed)
Pt d/c to home today.  IV removed intact.  Rx's given to pt w/all questions and concerns addressed.  D/C paperwork reviewed and education provided with all questions and concerns addressed.  Pt husband at bedside for home transport.    Flu Vaccine Administered.

## 2015-11-25 ENCOUNTER — Encounter: Payer: Self-pay | Admitting: *Deleted

## 2015-11-25 ENCOUNTER — Other Ambulatory Visit: Payer: Self-pay | Admitting: *Deleted

## 2015-11-25 NOTE — Patient Outreach (Signed)
Transition of care call successful- follow up on referral from [facility] liaison for pt's recent hospitalization 9/11-9/13 dizziness, acute on chronic renal failure.  Spoke with pt, HIPAA verified. Pt reports doing good since discharge, to f/u with Urologist 9/20, Kidney MD 9/27 and Dr. Sanda Klein 9/29.   Pt reports no problems urinating.  RN CM attempted to review discharge medications to which pt reports they reviewed them in the hospital, does not want to get the papers now, currently has company.  Pt reports she thinks home health was ordered but when they call going to decline, had them before and did not see how it helped her.   RN CM discussed [facility] transition of care program, follow for 31 days post discharge (weekly phone calls, a home visit) to which pt agreed to f/u phone calls, does not need a home visit.     Plan: As discussed with pt,  plan to follow up again with pt next week telephonically as part of ongoing transition of care.    Tammy [name].   [facility]  [phone]

## 2015-11-30 DIAGNOSIS — N1330 Unspecified hydronephrosis: Secondary | ICD-10-CM | POA: Diagnosis not present

## 2015-11-30 DIAGNOSIS — N390 Urinary tract infection, site not specified: Secondary | ICD-10-CM | POA: Diagnosis not present

## 2015-11-30 DIAGNOSIS — Z79899 Other long term (current) drug therapy: Secondary | ICD-10-CM | POA: Diagnosis not present

## 2015-11-30 DIAGNOSIS — Z86711 Personal history of pulmonary embolism: Secondary | ICD-10-CM | POA: Diagnosis not present

## 2015-12-01 ENCOUNTER — Other Ambulatory Visit: Payer: Self-pay | Admitting: *Deleted

## 2015-12-01 ENCOUNTER — Encounter: Payer: Self-pay | Admitting: *Deleted

## 2015-12-01 NOTE — Patient Outreach (Signed)
Transition of care call not completed as called pt, would not verify HIPAA- currently on another call, this RN CM to call back.    Plan:  To call pt later today.    Zara Chess.   [facility]  [phone]

## 2015-12-01 NOTE — Patient Outreach (Signed)
Transition of  Care call successful.  Ongoing follow up on recent hospitalization 9/11-9/13 for dizziness, acute to chronic renal failure, renal insufficiency.  Spoke with pt, HIPAA verified.  Pt reports finished antibiotic, taking all of her medications.  Pt reports saw urologist yesterday.  Pt  reports doing fine, states don't think I need for you to call again.     Plan:  RN CM to close pt's case as pt wanted no further follow up.           Plan to update care plans.              Case closure letter to be sent to pt.           Plan to send by in basket case closure letter to Dr. Sanda Klein.            Plan to inform [facility] care management assistant to close case.     Zara Chess.   [facility]  [phone]

## 2015-12-07 DIAGNOSIS — N1330 Unspecified hydronephrosis: Secondary | ICD-10-CM | POA: Diagnosis not present

## 2015-12-07 DIAGNOSIS — N390 Urinary tract infection, site not specified: Secondary | ICD-10-CM | POA: Diagnosis not present

## 2015-12-07 DIAGNOSIS — K5710 Diverticulosis of small intestine without perforation or abscess without bleeding: Secondary | ICD-10-CM | POA: Diagnosis not present

## 2015-12-07 DIAGNOSIS — M9983 Other biomechanical lesions of lumbar region: Secondary | ICD-10-CM | POA: Diagnosis not present

## 2015-12-07 DIAGNOSIS — M4806 Spinal stenosis, lumbar region: Secondary | ICD-10-CM | POA: Diagnosis not present

## 2015-12-08 ENCOUNTER — Ambulatory Visit: Payer: Self-pay | Admitting: *Deleted

## 2015-12-09 ENCOUNTER — Telehealth: Payer: Self-pay | Admitting: Cardiovascular Disease

## 2015-12-09 NOTE — Telephone Encounter (Signed)
Received cardiac clearance request for pt to proceed w/ left L3-5, a/p fusion (handwriting on form is difficult to read) on 12/22/15 w/ Dr. [name] @ the [facility] in [place], [place]. "Please have Dr. [name] sign both forms; instruct pt on Eliquis and fax back to [phone]. Thx!" Forms placed on Dr. Donivan Scull desk for review.

## 2015-12-10 ENCOUNTER — Ambulatory Visit: Payer: Medicare Other | Admitting: *Deleted

## 2015-12-10 ENCOUNTER — Ambulatory Visit (INDEPENDENT_AMBULATORY_CARE_PROVIDER_SITE_OTHER): Payer: Medicare Other | Admitting: Family Medicine

## 2015-12-10 ENCOUNTER — Encounter: Payer: Self-pay | Admitting: Family Medicine

## 2015-12-10 DIAGNOSIS — I209 Angina pectoris, unspecified: Secondary | ICD-10-CM | POA: Diagnosis not present

## 2015-12-10 DIAGNOSIS — M4806 Spinal stenosis, lumbar region: Secondary | ICD-10-CM

## 2015-12-10 DIAGNOSIS — I10 Essential (primary) hypertension: Secondary | ICD-10-CM | POA: Diagnosis not present

## 2015-12-10 DIAGNOSIS — N179 Acute kidney failure, unspecified: Secondary | ICD-10-CM | POA: Diagnosis not present

## 2015-12-10 DIAGNOSIS — M48061 Spinal stenosis, lumbar region without neurogenic claudication: Secondary | ICD-10-CM

## 2015-12-10 DIAGNOSIS — I6523 Occlusion and stenosis of bilateral carotid arteries: Secondary | ICD-10-CM | POA: Diagnosis not present

## 2015-12-10 DIAGNOSIS — G9389 Other specified disorders of brain: Secondary | ICD-10-CM

## 2015-12-10 DIAGNOSIS — N189 Chronic kidney disease, unspecified: Secondary | ICD-10-CM

## 2015-12-10 NOTE — Assessment & Plan Note (Addendum)
Goal LDL is less than 70 and her last LDL was 35 on crestor, monitored by Dr. [name]

## 2015-12-10 NOTE — Assessment & Plan Note (Signed)
Recent hospital labs reviewed; Cr trended down prior to discharge

## 2015-12-10 NOTE — Patient Instructions (Addendum)
We'll get a scan of your neck I've sent a note to Dr. [name] to keep him in the loop before he decides about stopping your Eliquis Practice good fall precautions Keep your phone handy or consider a fall alarm
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
## 2015-12-10 NOTE — Assessment & Plan Note (Signed)
Plans for upcoming surgery; I sent a note to cardiologist to alert him of carotid scan planned prior to surgery, in case he wants to wait for results prior to giving approval to stop the eliquis

## 2015-12-10 NOTE — Assessment & Plan Note (Signed)
Controlled today 

## 2015-12-10 NOTE — Assessment & Plan Note (Signed)
Suggestive of stroke back in 2014 possibly, would love to see prior imaging

## 2015-12-12 NOTE — Telephone Encounter (Signed)
Acceptable risk for surgery Would use compression hose during surgical period, SCDS Hold eliquis 3 days prior to procedure Restart when surgery feels is acceptable from bleeding risk

## 2015-12-13 ENCOUNTER — Other Ambulatory Visit: Payer: Self-pay | Admitting: Family Medicine

## 2015-12-13 NOTE — Telephone Encounter (Signed)
Rx sent as PRN with cautions

## 2015-12-13 NOTE — Telephone Encounter (Signed)
Routed to fax # provided. 

## 2015-12-14 ENCOUNTER — Ambulatory Visit
Admission: RE | Admit: 2015-12-14 | Discharge: 2015-12-14 | Disposition: A | Discharge status: Home / Self Care | Payer: Medicare Other | Source: Ambulatory Visit | Attending: Family Medicine | Admitting: Family Medicine

## 2015-12-14 DIAGNOSIS — I6523 Occlusion and stenosis of bilateral carotid arteries: Secondary | ICD-10-CM | POA: Insufficient documentation

## 2015-12-14 DIAGNOSIS — I63233 Cerebral infarction due to unspecified occlusion or stenosis of bilateral carotid arteries: Secondary | ICD-10-CM | POA: Diagnosis not present

## 2015-12-17 ENCOUNTER — Ambulatory Visit: Payer: Medicare Other | Admitting: *Deleted

## 2015-12-22 DIAGNOSIS — Z8673 Personal history of transient ischemic attack (TIA), and cerebral infarction without residual deficits: Secondary | ICD-10-CM | POA: Diagnosis not present

## 2015-12-22 DIAGNOSIS — M5127 Other intervertebral disc displacement, lumbosacral region: Secondary | ICD-10-CM | POA: Diagnosis present

## 2015-12-22 DIAGNOSIS — I10 Essential (primary) hypertension: Secondary | ICD-10-CM | POA: Diagnosis present

## 2015-12-22 DIAGNOSIS — E119 Type 2 diabetes mellitus without complications: Secondary | ICD-10-CM | POA: Diagnosis present

## 2015-12-22 DIAGNOSIS — J45909 Unspecified asthma, uncomplicated: Secondary | ICD-10-CM | POA: Diagnosis present

## 2015-12-22 DIAGNOSIS — M48062 Spinal stenosis, lumbar region with neurogenic claudication: Secondary | ICD-10-CM | POA: Diagnosis present

## 2015-12-22 DIAGNOSIS — I2510 Atherosclerotic heart disease of native coronary artery without angina pectoris: Secondary | ICD-10-CM | POA: Diagnosis present

## 2015-12-22 DIAGNOSIS — M5432 Sciatica, left side: Secondary | ICD-10-CM | POA: Diagnosis present

## 2015-12-22 DIAGNOSIS — M9983 Other biomechanical lesions of lumbar region: Secondary | ICD-10-CM | POA: Diagnosis not present

## 2015-12-22 DIAGNOSIS — I639 Cerebral infarction, unspecified: Secondary | ICD-10-CM | POA: Diagnosis not present

## 2015-12-22 DIAGNOSIS — E039 Hypothyroidism, unspecified: Secondary | ICD-10-CM | POA: Diagnosis present

## 2015-12-22 DIAGNOSIS — E7800 Pure hypercholesterolemia, unspecified: Secondary | ICD-10-CM | POA: Diagnosis present

## 2015-12-22 DIAGNOSIS — K219 Gastro-esophageal reflux disease without esophagitis: Secondary | ICD-10-CM | POA: Diagnosis present

## 2015-12-22 DIAGNOSIS — M4326 Fusion of spine, lumbar region: Secondary | ICD-10-CM

## 2015-12-22 DIAGNOSIS — M4316 Spondylolisthesis, lumbar region: Secondary | ICD-10-CM | POA: Diagnosis not present

## 2015-12-22 HISTORY — DX: Fusion of spine, lumbar region: M43.26

## 2015-12-25 DIAGNOSIS — Z4789 Encounter for other orthopedic aftercare: Secondary | ICD-10-CM | POA: Diagnosis not present

## 2015-12-25 DIAGNOSIS — N189 Chronic kidney disease, unspecified: Secondary | ICD-10-CM | POA: Diagnosis not present

## 2015-12-25 DIAGNOSIS — M9983 Other biomechanical lesions of lumbar region: Secondary | ICD-10-CM | POA: Diagnosis not present

## 2015-12-25 DIAGNOSIS — M4326 Fusion of spine, lumbar region: Secondary | ICD-10-CM | POA: Diagnosis not present

## 2015-12-25 DIAGNOSIS — E1122 Type 2 diabetes mellitus with diabetic chronic kidney disease: Secondary | ICD-10-CM | POA: Diagnosis not present

## 2015-12-25 DIAGNOSIS — M48062 Spinal stenosis, lumbar region with neurogenic claudication: Secondary | ICD-10-CM | POA: Diagnosis not present

## 2015-12-25 DIAGNOSIS — I129 Hypertensive chronic kidney disease with stage 1 through stage 4 chronic kidney disease, or unspecified chronic kidney disease: Secondary | ICD-10-CM | POA: Diagnosis not present

## 2015-12-25 DIAGNOSIS — Z981 Arthrodesis status: Secondary | ICD-10-CM | POA: Diagnosis not present

## 2015-12-25 DIAGNOSIS — Z7984 Long term (current) use of oral hypoglycemic drugs: Secondary | ICD-10-CM | POA: Diagnosis not present

## 2015-12-25 DIAGNOSIS — M4316 Spondylolisthesis, lumbar region: Secondary | ICD-10-CM | POA: Diagnosis not present

## 2015-12-27 DIAGNOSIS — Z4789 Encounter for other orthopedic aftercare: Secondary | ICD-10-CM | POA: Diagnosis not present

## 2015-12-27 DIAGNOSIS — I129 Hypertensive chronic kidney disease with stage 1 through stage 4 chronic kidney disease, or unspecified chronic kidney disease: Secondary | ICD-10-CM | POA: Diagnosis not present

## 2015-12-27 DIAGNOSIS — M4316 Spondylolisthesis, lumbar region: Secondary | ICD-10-CM | POA: Diagnosis not present

## 2015-12-27 DIAGNOSIS — M4326 Fusion of spine, lumbar region: Secondary | ICD-10-CM | POA: Diagnosis not present

## 2015-12-27 DIAGNOSIS — M9983 Other biomechanical lesions of lumbar region: Secondary | ICD-10-CM | POA: Diagnosis not present

## 2015-12-27 DIAGNOSIS — M48062 Spinal stenosis, lumbar region with neurogenic claudication: Secondary | ICD-10-CM | POA: Diagnosis not present

## 2015-12-29 DIAGNOSIS — M9983 Other biomechanical lesions of lumbar region: Secondary | ICD-10-CM | POA: Diagnosis not present

## 2015-12-29 DIAGNOSIS — M4316 Spondylolisthesis, lumbar region: Secondary | ICD-10-CM | POA: Diagnosis not present

## 2015-12-29 DIAGNOSIS — M48062 Spinal stenosis, lumbar region with neurogenic claudication: Secondary | ICD-10-CM | POA: Diagnosis not present

## 2015-12-29 DIAGNOSIS — M4326 Fusion of spine, lumbar region: Secondary | ICD-10-CM | POA: Diagnosis not present

## 2015-12-29 DIAGNOSIS — Z4789 Encounter for other orthopedic aftercare: Secondary | ICD-10-CM | POA: Diagnosis not present

## 2015-12-29 DIAGNOSIS — I129 Hypertensive chronic kidney disease with stage 1 through stage 4 chronic kidney disease, or unspecified chronic kidney disease: Secondary | ICD-10-CM | POA: Diagnosis not present

## 2015-12-30 ENCOUNTER — Other Ambulatory Visit: Payer: Self-pay | Admitting: Family Medicine

## 2015-12-31 ENCOUNTER — Other Ambulatory Visit: Payer: Self-pay | Admitting: *Deleted

## 2015-12-31 DIAGNOSIS — I129 Hypertensive chronic kidney disease with stage 1 through stage 4 chronic kidney disease, or unspecified chronic kidney disease: Secondary | ICD-10-CM | POA: Diagnosis not present

## 2015-12-31 DIAGNOSIS — Z4789 Encounter for other orthopedic aftercare: Secondary | ICD-10-CM | POA: Diagnosis not present

## 2015-12-31 DIAGNOSIS — M4316 Spondylolisthesis, lumbar region: Secondary | ICD-10-CM | POA: Diagnosis not present

## 2015-12-31 DIAGNOSIS — M9983 Other biomechanical lesions of lumbar region: Secondary | ICD-10-CM | POA: Diagnosis not present

## 2015-12-31 DIAGNOSIS — M48062 Spinal stenosis, lumbar region with neurogenic claudication: Secondary | ICD-10-CM | POA: Diagnosis not present

## 2015-12-31 DIAGNOSIS — M4326 Fusion of spine, lumbar region: Secondary | ICD-10-CM | POA: Diagnosis not present

## 2015-12-31 MED ORDER — METOPROLOL SUCCINATE ER 50 MG PO TB24: 50.0000 mg | ORAL_TABLET | Freq: Every day | ORAL | 3 refills | Status: DC

## 2016-01-03 DIAGNOSIS — I129 Hypertensive chronic kidney disease with stage 1 through stage 4 chronic kidney disease, or unspecified chronic kidney disease: Secondary | ICD-10-CM | POA: Diagnosis not present

## 2016-01-03 DIAGNOSIS — M4316 Spondylolisthesis, lumbar region: Secondary | ICD-10-CM | POA: Diagnosis not present

## 2016-01-03 DIAGNOSIS — Z4789 Encounter for other orthopedic aftercare: Secondary | ICD-10-CM | POA: Diagnosis not present

## 2016-01-03 DIAGNOSIS — M4326 Fusion of spine, lumbar region: Secondary | ICD-10-CM | POA: Diagnosis not present

## 2016-01-03 DIAGNOSIS — M9983 Other biomechanical lesions of lumbar region: Secondary | ICD-10-CM | POA: Diagnosis not present

## 2016-01-03 DIAGNOSIS — M48062 Spinal stenosis, lumbar region with neurogenic claudication: Secondary | ICD-10-CM | POA: Diagnosis not present

## 2016-01-06 DIAGNOSIS — M9983 Other biomechanical lesions of lumbar region: Secondary | ICD-10-CM | POA: Diagnosis not present

## 2016-01-07 ENCOUNTER — Other Ambulatory Visit: Payer: Self-pay | Admitting: Family Medicine

## 2016-01-09 NOTE — Telephone Encounter (Signed)
On Eliquis; Rx approved

## 2016-01-10 DIAGNOSIS — I129 Hypertensive chronic kidney disease with stage 1 through stage 4 chronic kidney disease, or unspecified chronic kidney disease: Secondary | ICD-10-CM | POA: Diagnosis not present

## 2016-01-10 DIAGNOSIS — M9983 Other biomechanical lesions of lumbar region: Secondary | ICD-10-CM | POA: Diagnosis not present

## 2016-01-10 DIAGNOSIS — Z4789 Encounter for other orthopedic aftercare: Secondary | ICD-10-CM | POA: Diagnosis not present

## 2016-01-10 DIAGNOSIS — M4326 Fusion of spine, lumbar region: Secondary | ICD-10-CM | POA: Diagnosis not present

## 2016-01-10 DIAGNOSIS — M48062 Spinal stenosis, lumbar region with neurogenic claudication: Secondary | ICD-10-CM | POA: Diagnosis not present

## 2016-01-10 DIAGNOSIS — M4316 Spondylolisthesis, lumbar region: Secondary | ICD-10-CM | POA: Diagnosis not present

## 2016-01-13 DIAGNOSIS — Z4789 Encounter for other orthopedic aftercare: Secondary | ICD-10-CM | POA: Diagnosis not present

## 2016-01-13 DIAGNOSIS — I129 Hypertensive chronic kidney disease with stage 1 through stage 4 chronic kidney disease, or unspecified chronic kidney disease: Secondary | ICD-10-CM | POA: Diagnosis not present

## 2016-01-13 DIAGNOSIS — M4316 Spondylolisthesis, lumbar region: Secondary | ICD-10-CM | POA: Diagnosis not present

## 2016-01-13 DIAGNOSIS — M48062 Spinal stenosis, lumbar region with neurogenic claudication: Secondary | ICD-10-CM | POA: Diagnosis not present

## 2016-01-13 DIAGNOSIS — M4326 Fusion of spine, lumbar region: Secondary | ICD-10-CM | POA: Diagnosis not present

## 2016-01-13 DIAGNOSIS — M9983 Other biomechanical lesions of lumbar region: Secondary | ICD-10-CM | POA: Diagnosis not present

## 2016-01-17 DIAGNOSIS — N390 Urinary tract infection, site not specified: Secondary | ICD-10-CM | POA: Diagnosis not present

## 2016-01-31 ENCOUNTER — Encounter: Payer: Self-pay | Admitting: Family Medicine

## 2016-01-31 ENCOUNTER — Ambulatory Visit (INDEPENDENT_AMBULATORY_CARE_PROVIDER_SITE_OTHER): Payer: Medicare Other | Admitting: Family Medicine

## 2016-01-31 VITALS — BP 121/79 | HR 59 | Ht 62.5 in | Wt 209.8 lb

## 2016-01-31 DIAGNOSIS — N183 Chronic kidney disease, stage 3 unspecified: Secondary | ICD-10-CM

## 2016-01-31 DIAGNOSIS — I10 Essential (primary) hypertension: Secondary | ICD-10-CM | POA: Diagnosis not present

## 2016-01-31 DIAGNOSIS — E1165 Type 2 diabetes mellitus with hyperglycemia: Secondary | ICD-10-CM | POA: Diagnosis not present

## 2016-01-31 DIAGNOSIS — E782 Mixed hyperlipidemia: Secondary | ICD-10-CM

## 2016-01-31 DIAGNOSIS — N390 Urinary tract infection, site not specified: Secondary | ICD-10-CM

## 2016-01-31 DIAGNOSIS — IMO0002 Reserved for concepts with insufficient information to code with codable children: Secondary | ICD-10-CM

## 2016-01-31 DIAGNOSIS — K760 Fatty (change of) liver, not elsewhere classified: Secondary | ICD-10-CM

## 2016-01-31 DIAGNOSIS — I25111 Atherosclerotic heart disease of native coronary artery with angina pectoris with documented spasm: Secondary | ICD-10-CM | POA: Diagnosis not present

## 2016-01-31 DIAGNOSIS — E118 Type 2 diabetes mellitus with unspecified complications: Secondary | ICD-10-CM | POA: Diagnosis not present

## 2016-01-31 DIAGNOSIS — I209 Angina pectoris, unspecified: Secondary | ICD-10-CM

## 2016-01-31 LAB — HEMOGLOBIN A1C: Hemoglobin A1C: 8.7

## 2016-01-31 MED ORDER — PANTOPRAZOLE SODIUM 40 MG PO TBEC: 40.0000 mg | DELAYED_RELEASE_TABLET | Freq: Every day | ORAL | 1 refills | Status: DC | PRN

## 2016-01-31 MED ORDER — ALBUTEROL SULFATE HFA 108 (90 BASE) MCG/ACT IN AERS: INHALATION_SPRAY | RESPIRATORY_TRACT | 3 refills | Status: DC

## 2016-01-31 MED ORDER — GLIPIZIDE 5 MG PO TABS: 5.0000 mg | ORAL_TABLET | Freq: Every day | ORAL | 1 refills | Status: DC

## 2016-01-31 MED ORDER — GABAPENTIN 300 MG PO CAPS: ORAL_CAPSULE | ORAL | 3 refills | Status: DC

## 2016-01-31 MED ORDER — HYDROXYZINE HCL 10 MG PO TABS: ORAL_TABLET | ORAL | 1 refills | Status: DC

## 2016-01-31 NOTE — Progress Notes (Signed)
BP 121/79 (BP Location: Left Arm, Patient Position: Sitting, Cuff Size: Large)   Pulse (!) 59   Ht 5' 2.5" (1.588 m)   Wt 209 lb 12.8 oz (95.2 kg)   SpO2 97%   BMI 37.76 kg/m    Subjective:    Patient ID: Tammy [name], female    DOB: 1945/09/30, 70 y.o.   MRN: [id number]  HPI: Tammy [name] is a 70 y.o. female who presents today to reestablish care after seeing a former provider at this office. She returns today, having last seen her in July.  Chief Complaint  Patient presents with  . reestablish care   HYPERTENSION / HYPERLIPIDEMIA Satisfied with current treatment? yes Duration of hypertension: chronic BP monitoring frequency: not checking BP medication side effects: no Duration of hyperlipidemia: chronic Cholesterol medication side effects: no Cholesterol supplements: none Past cholesterol medications: rosuvastatin (crestor) Medication compliance: excellent compliance Aspirin: no Recent stressors: no Recurrent headaches: no Visual changes: yes Palpitations: no Dyspnea: no Chest pain: no Lower extremity edema: no Dizzy/lightheaded: yes  DIABETES Hypoglycemic episodes:no Polydipsia/polyuria: no Visual disturbance: yes Chest pain: no Paresthesias: no Glucose Monitoring: yes Taking Insulin?: no Blood Pressure Monitoring: not checking Retinal Examination: Up to Date Foot Exam: Up to Date Diabetic Education: Completed Pneumovax: Up to Date Influenza: Up to Date Aspirin: no  Had back surgery done on Oct 11. Feeling better now, but baby steps  Active Ambulatory Problems    Diagnosis Date Noted  . Coronary atherosclerosis of native coronary artery 06/19/2013  . Back pain 06/19/2013  . History of back surgery 06/19/2013  . Hyperlipidemia 06/19/2013  . PAD (peripheral artery disease) ([place]) 06/19/2013  . Essential hypertension 12/16/2013  . Diabetes mellitus type 2 with complications, uncontrolled ([place]) 12/16/2013  . Chronic renal insufficiency, stage III  (moderate) 12/16/2013  . Acute pulmonary embolism ([place]) 01/30/2014  . Coronary artery disease   . Obesity   . Rhinitis, allergic 10/20/2014  . Frequent UTI 11/03/2013  . Congenital obstruction of ureteropelvic junction 11/21/2011  . Angina pectoris ([place]) 11/18/2014  . Abdominal aortic atherosclerosis ([place]) 12/18/2014  . Diverticulosis of colon 12/18/2014  . Bilateral atelectasis 04/11/2015  . Fatty liver disease, nonalcoholic 29/93/7169  . Hydronephrosis of right kidney 04/11/2015  . Abdominal aortic ectasia ([place]) 04/11/2015  . Detrusor muscle hypertonia 11/03/2013  . Spinal stenosis of lumbar region 06/06/2014  . Neuralgia neuritis, sciatic nerve 05/24/2014  . Herniated nucleus pulposus 06/06/2014  . Benign neoplasm of sigmoid colon   . Benign neoplasm of cecum   . Benign neoplasm of ascending colon   . Medication monitoring encounter 05/26/2015  . Renal failure (ARF), acute on chronic (HCC) 11/22/2015  . Encephalomalacia on imaging study 12/10/2015  . Calcification of both carotid arteries 12/10/2015   Resolved Ambulatory Problems    Diagnosis Date Noted  . SOB (shortness of breath) 06/19/2013  . Morbid obesity ([place]) 06/19/2013  . Sinus tachycardia 12/16/2013  . Acute idiopathic gout of left foot 01/23/2014  . Hypertension   . Right lower quadrant abdominal pain   . Morbid obesity ([place]) 11/18/2014  . Bilateral lower abdominal pain 12/16/2014  . Abdominal bloating 04/07/2015  . Vaginal discharge 04/07/2015  . Hydronephrosis 11/17/2011  . Noninfectious diarrhea    Past Medical History:  Diagnosis Date  . Asthma   . Benign neoplasm of colon   . Cervical disc disease   . Chronic diastolic CHF (congestive heart failure) ([place])   . CKD (chronic kidney disease), stage III   .  COPD (chronic obstructive pulmonary disease) ([place])   . Coronary artery disease   . Diabetes mellitus without complication ([place])   . Fusion of lumbar spine 12/22/2015  . GERD (gastroesophageal reflux  disease)   . History of DVT (deep vein thrombosis)   . History of tobacco abuse   . Hyperlipidemia   . Hypertension   . Hypothyroidism   . Obesity   . Palpitations   . PE (pulmonary embolism) 10/15  . PONV (postoperative nausea and vomiting)   . PVD (peripheral vascular disease) ([place])   . Stroke [facility])    Past Surgical History:  Procedure Laterality Date  . ANGIOPLASTY / STENTING FEMORAL     PVD; angioplasty right leg and left femoral artery with stenosis.   . APPENDECTOMY  Oct. 2016  . BACK SURGERY  03/2012  . BACK SURGERY     screws, rods replaced with new hardware.  [name] CARDIAC CATHETERIZATION  12/07/2011   Mid LAD 50%, distal LAD 50%, mid RCA 70%, distal RCA 100%   . CARDIAC CATHETERIZATION  01/04/2011   100% occluded mid RCA with good collaterals from distal LAD, normal LVEF.  [name] CHOLECYSTECTOMY    . COLONOSCOPY  2013  . COLONOSCOPY WITH PROPOFOL N/A 05/24/2015   Procedure: COLONOSCOPY WITH PROPOFOL;  Surgeon: [name], MD;  Location: [place];  Service: Endoscopy;  Laterality: N/A;  Diabetic - oral meds  . LAPAROSCOPIC APPENDECTOMY N/A 01/06/2015   Procedure: APPENDECTOMY LAPAROSCOPIC;  Surgeon: [name], MD;  Location: [facility];  Service: General;  Laterality: N/A;  . POLYPECTOMY  05/24/2015   Procedure: POLYPECTOMY;  Surgeon: [name], MD;  Location: [place];  Service: Endoscopy;;  . THYROIDECTOMY    . TRIGGER FINGER RELEASE     Allergies  Allergen Reactions  . Aleve [Naproxen Sodium]     Hives & itching   . Contrast Media [Iodinated Diagnostic Agents]     Pt avoids due to kidney issues  . Ioxaglate Other (See Comments)    Pt avoids due to kidney issues Pt avoids due to kidney issues  . Oxycodone Other (See Comments)    hallucinations  . Ranexa [Ranolazine]     Sick on stomach  . Sulfa Antibiotics Other (See Comments)    Pt avoids due to kidney issues  . Tramadol Other (See Comments)    nightmares   Outpatient Encounter  Prescriptions as of 01/31/2016  Medication Sig Note  . albuterol (PROAIR HFA) 108 (90 Base) MCG/ACT inhaler INHALE 2 PUFFS EVERY 4 HOURS AS NEEDED FOR WHEEZING OR SHORTNESS OF BREATH   . Cranberry 1000 MG CAPS Take by mouth.   Arne Cleveland 5 MG TABS tablet TAKE ONE TABLET BY MOUTH TWICE DAILY   . gabapentin (NEURONTIN) 300 MG capsule TAKE 1 CAPSULE 3 TIMES DAILY   . glipiZIDE (GLUCOTROL) 5 MG tablet Take 1 tablet (5 mg total) by mouth daily before breakfast.   . hydrOXYzine (ATARAX/VISTARIL) 10 MG tablet TAKE ONE TABLET BY MOUTH 3 TIMES DAILY AS NEEDED FOR ITCHING   . isosorbide mononitrate (IMDUR) 60 MG 24 hr tablet TAKE TWO TABLETS EVERY DAY (Patient taking differently: TAKE 0.5 TABLETS in AM and 1 tablet at night daily)   . losartan (COZAAR) 50 MG tablet TAKE 1 TABLET BY MOUTH DAILY   . metoprolol succinate (TOPROL-XL) 50 MG 24 hr tablet Take 1 tablet (50 mg total) by mouth daily. Take with or immediately following a meal.   . mirabegron ER (MYRBETRIQ) 25 MG TB24  tablet Take 25 mg by mouth daily.   . nitroGLYCERIN (NITROSTAT) 0.4 MG SL tablet Place 1 tablet (0.4 mg total) under the tongue every 5 (five) minutes as needed for chest pain.   . pantoprazole (PROTONIX) 40 MG tablet Take 1 tablet (40 mg total) by mouth daily as needed. Caution:prolonged use may increase risk of pneumonia, colitis, osteoporosis, anemia   . rosuvastatin (CRESTOR) 40 MG tablet Take 1 tablet (40 mg total) by mouth daily.   [name] SYNTHROID 112 MCG tablet Take 112 mcg by mouth daily before breakfast.  06/19/2013: Received from: External Pharmacy  . trimethoprim (TRIMPEX) 100 MG tablet Take 100 mg by mouth daily.   . [DISCONTINUED] gabapentin (NEURONTIN) 300 MG capsule TAKE 1 CAPSULE 3 TIMES DAILY   . [DISCONTINUED] glipiZIDE (GLUCOTROL) 5 MG tablet Take 1 tablet (5 mg total) by mouth daily before breakfast.   . [DISCONTINUED] hydrOXYzine (ATARAX/VISTARIL) 10 MG tablet TAKE ONE TABLET BY MOUTH 3 TIMES DAILY AS NEEDED FOR ITCHING     . [DISCONTINUED] pantoprazole (PROTONIX) 40 MG tablet Take 1 tablet (40 mg total) by mouth daily as needed. Caution:prolonged use may increase risk of pneumonia, colitis, osteoporosis, anemia   . [DISCONTINUED] PROAIR HFA 108 (90 Base) MCG/ACT inhaler INHALE 2 PUFFS EVERY 4 HOURS AS NEEDED FOR WHEEZING OR SHORTNESS OF BREATH   . blood glucose meter kit and supplies KIT Dispense based on patient and insurance preference. Use one time daily as directed, E11.9    No facility-administered encounter medications on file as of 01/31/2016.    Social History   Social History  . Marital status: Married    Spouse name: N/A  . Number of children: N/A  . Years of education: N/A   Occupational History  . Not on file.   Social History Main Topics  . Smoking status: Former Smoker    Packs/day: 0.50    Years: 15.00    Types: Cigarettes    Quit date: 03/13/2009  . Smokeless tobacco: Never Used  . Alcohol use No  . Drug use: No  . Sexual activity: Not on file   Other Topics Concern  . Not on file   Social History Narrative  . No narrative on file   Family History  Problem Relation Age of Onset  . Hypertension Father   . Heart disease Father   . Heart attack Father   . Diabetes Father   . Cancer Mother     colon  . Heart attack Brother   . Diabetes Brother   . Hypertension Brother   . Heart disease Brother   . Heart attack Brother   . Diabetes Brother   . Hypertension Brother   . Heart disease Brother   . Heart attack Brother   . Diabetes Brother   . Hypertension Brother   . Heart disease Brother   . Cancer Sister     lung  . COPD Sister   . COPD Sister    Review of Systems  Constitutional: Negative.   Respiratory: Negative.   Cardiovascular: Negative.   Psychiatric/Behavioral: Negative.     Per HPI unless specifically indicated above     Objective:    BP 121/79 (BP Location: Left Arm, Patient Position: Sitting, Cuff Size: Large)   Pulse (!) 59   Ht 5' 2.5" (1.588  m)   Wt 209 lb 12.8 oz (95.2 kg)   SpO2 97%   BMI 37.76 kg/m   Wt Readings from Last 3 Encounters:  01/31/16 209 lb  12.8 oz (95.2 kg)  12/10/15 211 lb 11.2 oz (96 kg)  11/22/15 216 lb 7.9 oz (98.2 kg)    Physical Exam  Constitutional: She is oriented to person, place, and time. She appears well-developed and well-nourished. No distress.  HENT:  Head: Normocephalic and atraumatic.  Right Ear: Hearing normal.  Left Ear: Hearing normal.  Nose: Nose normal.  Eyes: Conjunctivae and lids are normal. Right eye exhibits no discharge. Left eye exhibits no discharge. No scleral icterus.  Cardiovascular: Normal rate, regular rhythm, normal heart sounds and intact distal pulses.  Exam reveals no gallop and no friction rub.   No murmur heard. Pulmonary/Chest: Effort normal and breath sounds normal. No respiratory distress. She has no wheezes. She has no rales. She exhibits no tenderness.  Musculoskeletal: Normal range of motion.  Neurological: She is alert and oriented to person, place, and time.  Skin: Skin is warm, dry and intact. No rash noted. No erythema. No pallor.  Psychiatric: She has a normal mood and affect. Her speech is normal and behavior is normal. Judgment and thought content normal. Cognition and memory are normal.  Nursing note and vitals reviewed.     Assessment & Plan:   Problem List Items Addressed This Visit      Cardiovascular and Mediastinum   Essential hypertension    Stable and uncer good control. Continue current regimen. Continue to monitor. Call with any concerns.       Relevant Orders   Comprehensive metabolic panel   Microalbumin, Urine Waived   TSH   Coronary artery disease    Stable. Continue medical management. LDL <70. Continue to follow with Dr. [name]. Call with any concerns.       Relevant Orders   Comprehensive metabolic panel   TSH   Angina pectoris (HCC)    Stable. No pain at this time. Will continue to follow with Dr. [name]. Call with any  concerns.       Relevant Orders   Comprehensive metabolic panel   TSH     Digestive   Fatty liver disease, nonalcoholic    Rechecking liver functions today. Has been itchy, will refill her atarax. Call with any concerns. Await results.       Relevant Orders   Comprehensive metabolic panel   TSH     Endocrine   Diabetes mellitus type 2 with complications, uncontrolled ([place]) - Primary    A1c improved down to 8.7. Still not under good control. Will continue current regimen and watch diet and exercise and recheck in 3 months.       Relevant Medications   glipiZIDE (GLUCOTROL) 5 MG tablet   Other Relevant Orders   Bayer DCA Hb A1c Waived   Comprehensive metabolic panel   Microalbumin, Urine Waived   TSH     Genitourinary   Chronic renal insufficiency, stage III (moderate)    Checking labs today. Continue to monitor.       Relevant Orders   CBC with Differential/Platelet   Comprehensive metabolic panel   TSH   Frequent UTI    Just finished cipro this AM, to start low dose antibiotic again today. Seeing urology for this. Has UTI on UA today. Will forward results to urology. Will await culture. Call with any problems.       Relevant Medications   trimethoprim (TRIMPEX) 100 MG tablet   Other Relevant Orders   Comprehensive metabolic panel   TSH   UA/M w/rflx Culture, Routine     Other  Hyperlipidemia    Under good control not fasting today. Continue current regimen. Continue to monitor. Continue to follow with Dr. [name].      Relevant Orders   Comprehensive metabolic panel   Lipid Panel [place], Waived   TSH       Follow up plan: Return in about 3 months (around 05/02/2016) for DM visit.

## 2016-01-31 NOTE — Assessment & Plan Note (Signed)
Under good control not fasting today. Continue current regimen. Continue to monitor. Continue to follow with Dr. [name].

## 2016-01-31 NOTE — Assessment & Plan Note (Signed)
Rechecking liver functions today. Has been itchy, will refill her atarax. Call with any concerns. Await results.

## 2016-01-31 NOTE — Assessment & Plan Note (Signed)
A1c improved down to 8.7. Still not under good control. Will continue current regimen and watch diet and exercise and recheck in 3 months.

## 2016-01-31 NOTE — Assessment & Plan Note (Signed)
Stable and uncer good control. Continue current regimen. Continue to monitor. Call with any concerns.

## 2016-01-31 NOTE — Assessment & Plan Note (Signed)
Stable. No pain at this time. Will continue to follow with Dr. [name]. Call with any concerns.

## 2016-01-31 NOTE — Assessment & Plan Note (Signed)
Stable. Continue medical management. LDL <70. Continue to follow with Dr. [name]. Call with any concerns.

## 2016-01-31 NOTE — Assessment & Plan Note (Signed)
Checking labs today. Continue to monitor.

## 2016-01-31 NOTE — Assessment & Plan Note (Signed)
Just finished cipro this AM, to start low dose antibiotic again today. Seeing urology for this. Has UTI on UA today. Will forward results to urology. Will await culture. Call with any problems.

## 2016-02-01 LAB — COMPREHENSIVE METABOLIC PANEL
ALT: 12 IU/L (ref 0–32)
AST: 15 IU/L (ref 0–40)
Albumin/Globulin Ratio: 1.4 (ref 1.2–2.2)
Albumin: 4 g/dL (ref 3.5–4.8)
Alkaline Phosphatase: 89 IU/L (ref 39–117)
BUN/Creatinine Ratio: 16 (ref 12–28)
BUN: 21 mg/dL (ref 8–27)
Bilirubin Total: 0.6 mg/dL (ref 0.0–1.2)
CO2: 26 mmol/L (ref 18–29)
Calcium: 9.5 mg/dL (ref 8.7–10.3)
Chloride: 99 mmol/L (ref 96–106)
Creatinine, Ser: 1.32 mg/dL — ABNORMAL HIGH (ref 0.57–1.00)
GFR calc Af Amer: 46 mL/min/{1.73_m2} — ABNORMAL LOW (ref >59)
GFR calc non Af Amer: 40 mL/min/{1.73_m2} — ABNORMAL LOW (ref >59)
Globulin, Total: 2.9 g/dL (ref 1.5–4.5)
Glucose: 123 mg/dL — ABNORMAL HIGH (ref 65–99)
Potassium: 5.2 mmol/L (ref 3.5–5.2)
Sodium: 140 mmol/L (ref 134–144)
Total Protein: 6.9 g/dL (ref 6.0–8.5)

## 2016-02-01 LAB — CBC WITH DIFFERENTIAL/PLATELET
Basophils Absolute: 0 10*3/uL (ref 0.0–0.2)
Basos: 0 %
EOS (ABSOLUTE): 0.3 10*3/uL (ref 0.0–0.4)
Eos: 3 %
Hematocrit: 38.1 % (ref 34.0–46.6)
Hemoglobin: 11.8 g/dL (ref 11.1–15.9)
Immature Grans (Abs): 0 10*3/uL (ref 0.0–0.1)
Immature Granulocytes: 0 %
Lymphocytes Absolute: 2.8 10*3/uL (ref 0.7–3.1)
Lymphs: 33 %
MCH: 27.8 pg (ref 26.6–33.0)
MCHC: 31 g/dL — ABNORMAL LOW (ref 31.5–35.7)
MCV: 90 fL (ref 79–97)
Monocytes Absolute: 0.9 10*3/uL (ref 0.1–0.9)
Monocytes: 10 %
Neutrophils Absolute: 4.5 10*3/uL (ref 1.4–7.0)
Neutrophils: 54 %
Platelets: 267 10*3/uL (ref 150–379)
RBC: 4.24 x10E6/uL (ref 3.77–5.28)
RDW: 15.5 % — ABNORMAL HIGH (ref 12.3–15.4)
WBC: 8.5 10*3/uL (ref 3.4–10.8)

## 2016-02-01 LAB — TSH: TSH: 3.31 u[IU]/mL (ref 0.450–4.500)

## 2016-02-02 LAB — UA/M W/RFLX CULTURE, ROUTINE
Bilirubin, UA: NEGATIVE
Glucose, UA: NEGATIVE
Ketones, UA: NEGATIVE
Nitrite, UA: NEGATIVE
Specific Gravity, UA: 1.01 (ref 1.005–1.030)
Urobilinogen, Ur: 0.2 mg/dL (ref 0.2–1.0)
pH, UA: 5.5 (ref 5.0–7.5)

## 2016-02-02 LAB — URINE CULTURE, REFLEX

## 2016-02-02 LAB — MICROALBUMIN, URINE WAIVED
Creatinine, Urine Waived: 100 mg/dL (ref 10–300)
Microalb, Ur Waived: 150 mg/L — ABNORMAL HIGH (ref 0–19)

## 2016-02-02 LAB — LIPID PANEL PICCOLO, WAIVED
Chol/HDL Ratio Piccolo,Waive: 2.7 mg/dL
Cholesterol Piccolo, Waived: 152 mg/dL (ref <200)
HDL Chol Piccolo, Waived: 55 mg/dL — ABNORMAL LOW (ref >59)
LDL Chol Calc Piccolo Waived: 52 mg/dL (ref <100)
Triglycerides Piccolo,Waived: 224 mg/dL — ABNORMAL HIGH (ref <150)
VLDL Chol Calc Piccolo,Waive: 45 mg/dL — ABNORMAL HIGH (ref <30)

## 2016-02-02 LAB — BAYER DCA HB A1C WAIVED: HB A1C (BAYER DCA - WAIVED): 8.7 % — ABNORMAL HIGH (ref <7.0)

## 2016-02-02 LAB — MICROSCOPIC EXAMINATION: RBC, UA: >30 /hpf — AB (ref 0–?)

## 2016-02-08 DIAGNOSIS — M9983 Other biomechanical lesions of lumbar region: Secondary | ICD-10-CM | POA: Diagnosis not present

## 2016-02-18 DIAGNOSIS — E063 Autoimmune thyroiditis: Secondary | ICD-10-CM | POA: Diagnosis not present

## 2016-02-18 DIAGNOSIS — E890 Postprocedural hypothyroidism: Secondary | ICD-10-CM | POA: Diagnosis not present

## 2016-02-25 ENCOUNTER — Other Ambulatory Visit: Payer: Self-pay | Admitting: *Deleted

## 2016-02-25 DIAGNOSIS — K915 Postcholecystectomy syndrome: Secondary | ICD-10-CM | POA: Diagnosis not present

## 2016-02-25 DIAGNOSIS — E890 Postprocedural hypothyroidism: Secondary | ICD-10-CM | POA: Diagnosis not present

## 2016-02-25 DIAGNOSIS — E063 Autoimmune thyroiditis: Secondary | ICD-10-CM | POA: Diagnosis not present

## 2016-02-25 DIAGNOSIS — Z809 Family history of malignant neoplasm, unspecified: Secondary | ICD-10-CM | POA: Diagnosis not present

## 2016-02-25 DIAGNOSIS — K229 Disease of esophagus, unspecified: Secondary | ICD-10-CM | POA: Diagnosis not present

## 2016-02-25 MED ORDER — APIXABAN 5 MG PO TABS: 5.0000 mg | ORAL_TABLET | Freq: Two times a day (BID) | ORAL | 1 refills | Status: DC

## 2016-02-29 ENCOUNTER — Other Ambulatory Visit: Payer: Self-pay

## 2016-02-29 MED ORDER — LOSARTAN POTASSIUM 50 MG PO TABS: 50.0000 mg | ORAL_TABLET | Freq: Every day | ORAL | 6 refills | Status: DC

## 2016-02-29 NOTE — Telephone Encounter (Signed)
Refill sent for Losartan 50 mg

## 2016-03-22 ENCOUNTER — Other Ambulatory Visit: Payer: Self-pay | Admitting: *Deleted

## 2016-03-22 MED ORDER — ISOSORBIDE MONONITRATE ER 60 MG PO TB24: ORAL_TABLET | ORAL | 3 refills | Status: DC

## 2016-03-27 LAB — HM DIABETES EYE EXAM

## 2016-03-28 DIAGNOSIS — H2513 Age-related nuclear cataract, bilateral: Secondary | ICD-10-CM | POA: Diagnosis not present

## 2016-03-31 ENCOUNTER — Ambulatory Visit (INDEPENDENT_AMBULATORY_CARE_PROVIDER_SITE_OTHER): Payer: Medicare Other | Admitting: Cardiovascular Disease

## 2016-03-31 ENCOUNTER — Encounter: Payer: Self-pay | Admitting: Cardiovascular Disease

## 2016-03-31 VITALS — BP 145/75 | HR 68 | Ht 65.5 in | Wt 217.5 lb

## 2016-03-31 DIAGNOSIS — N183 Chronic kidney disease, stage 3 unspecified: Secondary | ICD-10-CM

## 2016-03-31 DIAGNOSIS — I10 Essential (primary) hypertension: Secondary | ICD-10-CM | POA: Diagnosis not present

## 2016-03-31 DIAGNOSIS — E118 Type 2 diabetes mellitus with unspecified complications: Secondary | ICD-10-CM

## 2016-03-31 DIAGNOSIS — IMO0002 Reserved for concepts with insufficient information to code with codable children: Secondary | ICD-10-CM

## 2016-03-31 DIAGNOSIS — I2510 Atherosclerotic heart disease of native coronary artery without angina pectoris: Secondary | ICD-10-CM

## 2016-03-31 DIAGNOSIS — E1165 Type 2 diabetes mellitus with hyperglycemia: Secondary | ICD-10-CM

## 2016-03-31 DIAGNOSIS — I700 Atherosclerosis of aorta: Secondary | ICD-10-CM

## 2016-03-31 DIAGNOSIS — E782 Mixed hyperlipidemia: Secondary | ICD-10-CM | POA: Diagnosis not present

## 2016-03-31 DIAGNOSIS — G8929 Other chronic pain: Secondary | ICD-10-CM

## 2016-03-31 DIAGNOSIS — I739 Peripheral vascular disease, unspecified: Secondary | ICD-10-CM | POA: Diagnosis not present

## 2016-03-31 DIAGNOSIS — M549 Dorsalgia, unspecified: Secondary | ICD-10-CM

## 2016-03-31 NOTE — Progress Notes (Signed)
Cardiology Office Note  Date:  03/31/2016   ID:  Tammy [name], DOB [birth year], MRN [id number]  PCP:  Tammy Liter, DO   Chief Complaint  Patient presents with  . other     70mof/u. Pt states she is doing well. Reviewed meds with pt verbally.    HPI:  Ms. HHoganis a 71year old woman with history of coronary artery disease, occluded mid RCA, PAD, hypertension, back surgery January 2014 with rods placed at that time, remote smoking history stopped 5 years ago,  obesity, deconditioned secondary to chronic back pain who presents for routine followup of her coronary artery disease   PAD with angioplasty of the legs bilaterally, COPD, chronic kidney disease  diabetes type 2, uncontrolled  diagnosis of PE in October 2015, started on anticoagulation, eliquis  In follow-up today she reports that she is still recovering from back surgery October 2017 Her weight is up, still having chronic back pain Prior to surgery was having severe pain, falls Pain is somewhat better, no recent falls or leg weakness  She continues to work long hours, 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 Ate poorly through  the holidays Husband does most of the ADLs she does not get out of the house to do very much   EKG on today's visit shows normal sinus rhythm with rate 78 bpm,no significant ST or T-wave changes  Other past medical history Previously in the emergency room, treated for bronchial asthma. Takes her albuterol with some improvement of her symptoms. Shortness of breath symptoms have been dating back for many years.  Other past medical history  may have developed a DVT from sitting long periods of time.     prior heart catheterizations in 2012 and 2013. Catheterization in 2012 documented occluded mid RCA with left to right collaterals, 40% mid LAD disease, 40% mid circumflex disease, 30% proximal LAD disease Catheterization in September 2013 showed normal LV function, occluded mid RCA with left to right collaterals, 50% mid and distal LAD disease  Stress test September 2014 showing very small anterior wall defect likely secondary to breast attenuation artifact Echocardiogram May 2014 showing low normal ejection fraction, mild TR, mild pulmonary hypertension  History of urinary tract infection.     PMH:   has a past medical history of Asthma; Benign neoplasm of colon; Cervical disc disease; Chronic diastolic CHF (congestive heart failure) ([place]; CKD (chronic kidney disease), stage III; COPD (chronic obstructive pulmonary disease) ([place]; Coronary artery disease; Diabetes mellitus without complication ([place]; Fusion of lumbar spine (12/22/2015); GERD (gastroesophageal reflux disease); History of DVT (deep vein thrombosis); History of tobacco abuse; Hyperlipidemia; Hypertension; Hypothyroidism; Obesity; Palpitations; PE (pulmonary embolism) (10/15); PONV (postoperative nausea and vomiting); PVD (peripheral vascular disease) ([place]; and Stroke ([place].  PSH:    Past Surgical History:  Procedure Laterality Date  . ANGIOPLASTY / STENTING FEMORAL     PVD; angioplasty right leg and left femoral artery with stenosis.   . APPENDECTOMY  Oct. 2016  . BACK SURGERY  03/2012  . BACK SURGERY     screws, rods replaced with new hardware.  .Marland KitchenCARDIAC CATHETERIZATION  12/07/2011   Mid LAD 50%, distal LAD 50%, mid  RCA 70%, distal RCA 100%   . CARDIAC CATHETERIZATION  01/04/2011   100% occluded mid RCA with good collaterals from distal LAD, normal LVEF.  [name] CHOLECYSTECTOMY    . COLONOSCOPY  2013  . COLONOSCOPY WITH PROPOFOL N/A 05/24/2015   Procedure: COLONOSCOPY WITH PROPOFOL;  Surgeon: Tammy Lame, MD;   Location: [facility];  Service: Endoscopy;  Laterality: N/A;  Diabetic - oral meds  . LAPAROSCOPIC APPENDECTOMY N/A 01/06/2015   Procedure: APPENDECTOMY LAPAROSCOPIC;  Surgeon: Tammy Lye, MD;  Location: [facility];  Service: General;  Laterality: N/A;  . POLYPECTOMY  05/24/2015   Procedure: POLYPECTOMY;  Surgeon: Tammy Lame, MD;  Location: [facility];  Service: Endoscopy;;  . THYROIDECTOMY    . TRIGGER FINGER RELEASE      Current Outpatient Prescriptions  Medication Sig Dispense Refill  . albuterol (PROAIR HFA) 108 (90 Base) MCG/ACT inhaler INHALE 2 PUFFS EVERY 4 HOURS AS NEEDED FOR WHEEZING OR SHORTNESS OF BREATH 8.5 g 3  . apixaban (ELIQUIS) 5 MG TABS tablet Take 1 tablet (5 mg total) by mouth 2 (two) times daily. 180 tablet 1  . blood glucose meter kit and supplies KIT Dispense based on patient and insurance preference. Use one time daily as directed, E11.9 1 each 0  . gabapentin (NEURONTIN) 300 MG capsule TAKE 1 CAPSULE 3 TIMES DAILY 270 capsule 3  . glipiZIDE (GLUCOTROL) 5 MG tablet Take 1 tablet (5 mg total) by mouth daily before breakfast. 90 tablet 1  . hydrOXYzine (ATARAX/VISTARIL) 10 MG tablet TAKE ONE TABLET BY MOUTH 3 TIMES DAILY AS NEEDED FOR ITCHING 270 tablet 1  . isosorbide mononitrate (IMDUR) 60 MG 24 hr tablet TAKE TWO TABLETS EVERY DAY 60 tablet 3  . losartan (COZAAR) 50 MG tablet Take 1 tablet (50 mg total) by mouth daily. 30 tablet 6  . metoprolol succinate (TOPROL-XL) 50 MG 24 hr tablet Take 1 tablet (50 mg total) by mouth daily. Take with or immediately following a meal. 60 tablet 3  . mirabegron ER (MYRBETRIQ) 25 MG TB24 tablet Take 25 mg by mouth daily.    . nitroGLYCERIN (NITROSTAT) 0.4 MG SL tablet Place 1 tablet (0.4 mg total) under the tongue every 5 (five) minutes as needed for chest pain. 25 tablet 6  . pantoprazole (PROTONIX) 40 MG tablet Take 1 tablet (40 mg total) by mouth daily as needed. Caution:prolonged use may increase risk of  pneumonia, colitis, osteoporosis, anemia 90 tablet 1  . rosuvastatin (CRESTOR) 40 MG tablet Take 1 tablet (40 mg total) by mouth daily. 90 tablet 3  . SYNTHROID 112 MCG tablet Take 112 mcg by mouth daily before breakfast.     . trimethoprim (TRIMPEX) 100 MG tablet Take 100 mg by mouth daily.     No current facility-administered medications for this visit.      Allergies:   Aleve [naproxen sodium]; Contrast media [iodinated diagnostic agents]; Ioxaglate; Oxycodone; Ranexa [ranolazine]; Sulfa antibiotics; and Tramadol   Social History:  The patient  reports that she quit smoking about 7 years ago. Her smoking use included Cigarettes. She has a 7.50 pack-year smoking history. She has never used smokeless tobacco. She reports that she does not drink alcohol or use drugs.   Family History:   family history includes COPD in her sister and sister; Cancer in her mother and sister; Diabetes in her brother, brother, brother, and father; Heart attack in her brother, brother, brother, and father; Heart disease in her brother, brother, brother, and father; Hypertension  in her brother, brother, brother, and father.    Review of Systems: Review of Systems  Constitutional: Negative.   Respiratory: Negative.   Cardiovascular: Negative.   Gastrointestinal: Negative.   Musculoskeletal: Positive for back pain.  Neurological: Negative.   Psychiatric/Behavioral: Negative.   All other systems reviewed and are negative.    PHYSICAL EXAM: VS:  BP (!) 145/75 (BP Location: Left Arm, Patient Position: Sitting, Cuff Size: Large)   Pulse 68   Ht 5' 5.5" (1.664 m)   Wt 217 lb 8 oz (98.7 kg)   BMI 35.64 kg/m  , BMI Body mass index is 35.64 kg/m. GEN: Well nourished, well developed, in no acute distress  HEENT: normal  Neck: no JVD, carotid bruits, or masses Cardiac: RRR; no murmurs, rubs, or gallops,no edema  Respiratory:  clear to auscultation bilaterally, normal work of breathing GI: soft, nontender,  nondistended, + BS MS: no deformity or atrophy  Skin: warm and dry, no rash Neuro:  Strength and sensation are intact Psych: euthymic mood, full affect    Recent Labs: 11/22/2015: Magnesium 2.1 11/23/2015: Hemoglobin 12.0 01/31/2016: ALT 12; BUN 21; Creatinine, Ser 1.32; Platelets 267; Potassium 5.2; Sodium 140; TSH 3.310    Lipid Panel Lab Results  Component Value Date   CHOL 152 01/31/2016   HDL 39 (L) 09/07/2015   LDLCALC 35 09/07/2015   TRIG 224 (H) 01/31/2016      Wt Readings from Last 3 Encounters:  03/31/16 217 lb 8 oz (98.7 kg)  01/31/16 209 lb 12.8 oz (95.2 kg)  12/10/15 211 lb 11.2 oz (96 kg)       ASSESSMENT AND PLAN:  Atherosclerosis of native coronary artery of native heart without angina pectoris - Plan: EKG 12-Lead Currently with no symptoms of angina. No further workup at this time. Continue current medication regimen.  Mixed hyperlipidemia Cholesterol is at goal on the current lipid regimen. No changes to the medications were made.  Essential hypertension Blood pressure borderline elevated, recommended she monitor this closely at home  PAD (peripheral artery disease) ([place]) Stressed importance of aggressive diabetes and cholesterol control  Abdominal aortic atherosclerosis (HCC)  Chronic midline back pain, unspecified back location Long discussion concerning recent back surgery. Still with chronic pain, no significant exercise. Per the patient, not clear to start vigorous exercise given recent surgery  Uncontrolled type 2 diabetes mellitus with complication, without long-term current use of insulin (HCC) A1c continues to run high. Stressed importance of aggressive medication management to achieve hemoglobin A1c below 7 or 6 range given underlying coronary disease  Chronic renal insufficiency, stage III (moderate) Relationship between diabetes and renal disease discussed with her   Total encounter time more than 25 minutes  Greater than 50% was  spent in counseling and coordination of care with the patient   Disposition:   F/U  6 months   Orders Placed This Encounter  Procedures  . EKG 12-Lead     Signed, Esmond Plants, M.D., Ph.D. 03/31/2016  [address], [place]

## 2016-03-31 NOTE — Patient Instructions (Signed)

## 2016-04-13 DIAGNOSIS — M9983 Other biomechanical lesions of lumbar region: Secondary | ICD-10-CM | POA: Diagnosis not present

## 2016-04-17 DIAGNOSIS — N390 Urinary tract infection, site not specified: Secondary | ICD-10-CM | POA: Diagnosis not present

## 2016-04-17 DIAGNOSIS — Q6211 Congenital occlusion of ureteropelvic junction: Secondary | ICD-10-CM | POA: Diagnosis not present

## 2016-04-22 DIAGNOSIS — M48061 Spinal stenosis, lumbar region without neurogenic claudication: Secondary | ICD-10-CM | POA: Diagnosis not present

## 2016-04-25 DIAGNOSIS — N261 Atrophy of kidney (terminal): Secondary | ICD-10-CM | POA: Diagnosis not present

## 2016-04-25 DIAGNOSIS — Q6211 Congenital occlusion of ureteropelvic junction: Secondary | ICD-10-CM | POA: Diagnosis not present

## 2016-04-25 DIAGNOSIS — N1330 Unspecified hydronephrosis: Secondary | ICD-10-CM | POA: Diagnosis not present

## 2016-04-27 DIAGNOSIS — M9983 Other biomechanical lesions of lumbar region: Secondary | ICD-10-CM | POA: Diagnosis not present

## 2016-04-27 DIAGNOSIS — M4316 Spondylolisthesis, lumbar region: Secondary | ICD-10-CM | POA: Diagnosis not present

## 2016-05-02 ENCOUNTER — Ambulatory Visit: Payer: Medicare Other | Admitting: Family Medicine

## 2016-05-02 ENCOUNTER — Telehealth: Payer: Self-pay | Admitting: Cardiovascular Disease

## 2016-05-02 NOTE — Telephone Encounter (Signed)
Received cardiac clearance request for pt to proceed w/ Lt L2-3 A/P fusion on 05/17/16 @ [facility]. Per Dr. [name], pt is cleared to proceed w/ low risk & may hold Eliquis x 3 days prior to procedure.  Signed forms faxed to [facility] @ [phone].

## 2016-05-03 ENCOUNTER — Telehealth: Payer: Self-pay | Admitting: Cardiovascular Disease

## 2016-05-03 NOTE — Telephone Encounter (Signed)
Forms faxed to new number provided.

## 2016-05-03 NOTE — Telephone Encounter (Signed)
Clearance to [facility] did not go through. Re-faxed this morning, transmission failed again. Left message to call back with alternate fax number.

## 2016-05-03 NOTE — Telephone Encounter (Signed)
New fax number  [phone]

## 2016-05-12 ENCOUNTER — Ambulatory Visit: Payer: Medicare Other | Admitting: Family Medicine

## 2016-05-17 DIAGNOSIS — E039 Hypothyroidism, unspecified: Secondary | ICD-10-CM | POA: Diagnosis present

## 2016-05-17 DIAGNOSIS — M4316 Spondylolisthesis, lumbar region: Secondary | ICD-10-CM | POA: Diagnosis present

## 2016-05-17 DIAGNOSIS — J45909 Unspecified asthma, uncomplicated: Secondary | ICD-10-CM | POA: Diagnosis not present

## 2016-05-17 DIAGNOSIS — Z8673 Personal history of transient ischemic attack (TIA), and cerebral infarction without residual deficits: Secondary | ICD-10-CM | POA: Diagnosis not present

## 2016-05-17 DIAGNOSIS — M9983 Other biomechanical lesions of lumbar region: Secondary | ICD-10-CM | POA: Diagnosis not present

## 2016-05-17 DIAGNOSIS — K219 Gastro-esophageal reflux disease without esophagitis: Secondary | ICD-10-CM | POA: Diagnosis present

## 2016-05-17 DIAGNOSIS — N189 Chronic kidney disease, unspecified: Secondary | ICD-10-CM | POA: Diagnosis not present

## 2016-05-17 DIAGNOSIS — M5127 Other intervertebral disc displacement, lumbosacral region: Secondary | ICD-10-CM | POA: Diagnosis present

## 2016-05-17 DIAGNOSIS — E119 Type 2 diabetes mellitus without complications: Secondary | ICD-10-CM | POA: Diagnosis not present

## 2016-05-17 DIAGNOSIS — I2510 Atherosclerotic heart disease of native coronary artery without angina pectoris: Secondary | ICD-10-CM | POA: Diagnosis not present

## 2016-05-17 DIAGNOSIS — Z981 Arthrodesis status: Secondary | ICD-10-CM | POA: Diagnosis not present

## 2016-05-17 DIAGNOSIS — M48062 Spinal stenosis, lumbar region with neurogenic claudication: Secondary | ICD-10-CM | POA: Diagnosis not present

## 2016-05-17 DIAGNOSIS — I129 Hypertensive chronic kidney disease with stage 1 through stage 4 chronic kidney disease, or unspecified chronic kidney disease: Secondary | ICD-10-CM | POA: Diagnosis not present

## 2016-05-17 DIAGNOSIS — I10 Essential (primary) hypertension: Secondary | ICD-10-CM | POA: Diagnosis present

## 2016-06-01 DIAGNOSIS — M9983 Other biomechanical lesions of lumbar region: Secondary | ICD-10-CM | POA: Diagnosis not present

## 2016-07-03 DIAGNOSIS — N390 Urinary tract infection, site not specified: Secondary | ICD-10-CM | POA: Diagnosis not present

## 2016-07-04 DIAGNOSIS — M9983 Other biomechanical lesions of lumbar region: Secondary | ICD-10-CM | POA: Diagnosis not present

## 2016-07-12 ENCOUNTER — Other Ambulatory Visit: Payer: Self-pay

## 2016-07-12 MED ORDER — ROSUVASTATIN CALCIUM 40 MG PO TABS: 40.0000 mg | ORAL_TABLET | Freq: Every day | ORAL | 3 refills | Status: DC

## 2016-07-12 MED ORDER — METOPROLOL SUCCINATE ER 50 MG PO TB24: 50.0000 mg | ORAL_TABLET | Freq: Every day | ORAL | 3 refills | Status: DC

## 2016-07-12 NOTE — Telephone Encounter (Signed)
Requested Prescriptions   Signed Prescriptions Disp Refills  . rosuvastatin (CRESTOR) 40 MG tablet 90 tablet 3    Sig: Take 1 tablet (40 mg total) by mouth daily.    Authorizing Provider: [name]    Ordering User: Janan Ridge

## 2016-07-12 NOTE — Telephone Encounter (Signed)
Requested Prescriptions   Signed Prescriptions Disp Refills  . metoprolol succinate (TOPROL-XL) 50 MG 24 hr tablet 60 tablet 3    Sig: Take 1 tablet (50 mg total) by mouth daily. Take with or immediately following a meal.    Authorizing Provider: [name]    Ordering User: Janan Ridge

## 2016-07-14 ENCOUNTER — Encounter: Payer: Self-pay | Admitting: Family Medicine

## 2016-07-14 ENCOUNTER — Ambulatory Visit (INDEPENDENT_AMBULATORY_CARE_PROVIDER_SITE_OTHER): Payer: Medicare Other | Admitting: Family Medicine

## 2016-07-14 ENCOUNTER — Telehealth: Payer: Self-pay | Admitting: Family Medicine

## 2016-07-14 ENCOUNTER — Ambulatory Visit
Admission: RE | Admit: 2016-07-14 | Discharge: 2016-07-14 | Disposition: A | Discharge status: Home / Self Care | Payer: Medicare Other | Source: Ambulatory Visit | Attending: Family Medicine | Admitting: Family Medicine

## 2016-07-14 VITALS — BP 111/66 | HR 68 | Ht 62.5 in | Wt 224.8 lb

## 2016-07-14 DIAGNOSIS — I700 Atherosclerosis of aorta: Secondary | ICD-10-CM

## 2016-07-14 DIAGNOSIS — I2510 Atherosclerotic heart disease of native coronary artery without angina pectoris: Secondary | ICD-10-CM

## 2016-07-14 DIAGNOSIS — Z86711 Personal history of pulmonary embolism: Secondary | ICD-10-CM

## 2016-07-14 DIAGNOSIS — E782 Mixed hyperlipidemia: Secondary | ICD-10-CM

## 2016-07-14 DIAGNOSIS — E118 Type 2 diabetes mellitus with unspecified complications: Secondary | ICD-10-CM | POA: Diagnosis not present

## 2016-07-14 DIAGNOSIS — R0602 Shortness of breath: Secondary | ICD-10-CM

## 2016-07-14 DIAGNOSIS — I10 Essential (primary) hypertension: Secondary | ICD-10-CM

## 2016-07-14 DIAGNOSIS — E1165 Type 2 diabetes mellitus with hyperglycemia: Secondary | ICD-10-CM

## 2016-07-14 DIAGNOSIS — N183 Chronic kidney disease, stage 3 unspecified: Secondary | ICD-10-CM

## 2016-07-14 DIAGNOSIS — I739 Peripheral vascular disease, unspecified: Secondary | ICD-10-CM

## 2016-07-14 DIAGNOSIS — Z1159 Encounter for screening for other viral diseases: Secondary | ICD-10-CM | POA: Diagnosis not present

## 2016-07-14 DIAGNOSIS — I2699 Other pulmonary embolism without acute cor pulmonale: Secondary | ICD-10-CM

## 2016-07-14 DIAGNOSIS — IMO0002 Reserved for concepts with insufficient information to code with codable children: Secondary | ICD-10-CM

## 2016-07-14 DIAGNOSIS — I209 Angina pectoris, unspecified: Secondary | ICD-10-CM | POA: Diagnosis not present

## 2016-07-14 DIAGNOSIS — N390 Urinary tract infection, site not specified: Secondary | ICD-10-CM | POA: Diagnosis not present

## 2016-07-14 DIAGNOSIS — I129 Hypertensive chronic kidney disease with stage 1 through stage 4 chronic kidney disease, or unspecified chronic kidney disease: Secondary | ICD-10-CM

## 2016-07-14 DIAGNOSIS — M48061 Spinal stenosis, lumbar region without neurogenic claudication: Secondary | ICD-10-CM

## 2016-07-14 MED ORDER — PANTOPRAZOLE SODIUM 40 MG PO TBEC: 40.0000 mg | DELAYED_RELEASE_TABLET | Freq: Every day | ORAL | 1 refills | Status: DC | PRN

## 2016-07-14 MED ORDER — HYDROXYZINE HCL 10 MG PO TABS: ORAL_TABLET | ORAL | 1 refills | Status: DC

## 2016-07-14 MED ORDER — ALBUTEROL SULFATE HFA 108 (90 BASE) MCG/ACT IN AERS: INHALATION_SPRAY | RESPIRATORY_TRACT | 3 refills | Status: DC

## 2016-07-14 MED ORDER — GLIPIZIDE 5 MG PO TABS: 5.0000 mg | ORAL_TABLET | Freq: Every day | ORAL | 1 refills | Status: DC

## 2016-07-14 MED ORDER — CIPROFLOXACIN HCL 500 MG PO TABS: 500.0000 mg | ORAL_TABLET | Freq: Two times a day (BID) | ORAL | 0 refills | Status: DC

## 2016-07-14 NOTE — Assessment & Plan Note (Signed)
Under good control. Continue current regimen. Continue to monitor. Call with any concerns. 

## 2016-07-14 NOTE — Assessment & Plan Note (Addendum)
SOB and feeling exactly like she did when she had a PE previously. STAT CTA ordered today- await results and will treat appropriately. Given low oxygen, likely she will have to go to the ER if positive.

## 2016-07-14 NOTE — Assessment & Plan Note (Signed)
Under good control. Continue diet and exercise. Continue to monitor.

## 2016-07-14 NOTE — Assessment & Plan Note (Signed)
Rechecking labs today. Await results.  

## 2016-07-14 NOTE — Progress Notes (Addendum)
BP 111/66 (BP Location: Right Arm, Patient Position: Sitting, Cuff Size: Large)   Pulse 68   Ht 5' 2.5" (1.588 m)   Wt 224 lb 12.8 oz (102 kg)   SpO2 93%   BMI 40.46 kg/m    Subjective:    Patient ID: Tammy [name], female    DOB: 10-19-1945, 71 y.o.   MRN: [id number]  HPI: Tammy [name] is a 71 y.o. female  Chief Complaint  Patient presents with  . Hypertension  . Hyperlipidemia  . Diabetes   SHORTNESS OF BREATH- Tammy [name] is not feeling well at all. She has been feeling SOB. She notes that it is worse with any type of exercise. She notes that this is exactly how she was feeling when she had a PE in the past. She notes that she has been taking her eliquis ('5mg'$ ) and has not missed any doses. She notes that when this starts her back starts hurting severely on side in fleshy part Duration: 3 days ago started getting really bad, started about 2 weeks ago Onset: sudden Description of breathing discomfort: "Like when I had my blood clot in my lung" Severity: severe Episode duration: constant Frequency: constant Related to exertion: yes Cough: yes non-productive Chest tightness: yes Wheezing: yes Fevers: no Chest pain: yes Palpitations: no  Nausea: no Diaphoresis: no Deconditioning: yes Status: worse Aggravating factors: any type of movement Alleviating factors: nothing Treatments attempted: Nothing  Has been severely fatigued, has had a UTI and not sure she got rid of it. She has been sneezing and coughing and wheezing, can't walk without SOB, R leg has been hurting since she had her back surgery  HYPERTENSION / HYPERLIPIDEMIA Satisfied with current treatment? yes Duration of hypertension: chronic BP monitoring frequency: not checking BP medication side effects: no Past BP meds: losartan, metoprolol Duration of hyperlipidemia: chronic Cholesterol medication side effects: no Cholesterol supplements: none Past cholesterol medications: crestor Medication compliance: excellent  compliance Aspirin: no Recent stressors: no Recurrent headaches: no Visual changes: no Palpitations: no Dyspnea: yes Chest pain: yes Lower extremity edema: no Dizzy/lightheaded: yes  DIABETES Hypoglycemic episodes:no Polydipsia/polyuria: no Visual disturbance: no Chest pain: no Paresthesias: no Glucose Monitoring: no Taking Insulin?: no Blood Pressure Monitoring: a few times a month Retinal Examination: Not up to Date Foot Exam: Not up to Date Diabetic Education: Not Completed Pneumovax: Up to Date Influenza: Up to Date Aspirin: no  Relevant past medical, surgical, family and social history reviewed and updated as indicated. Interim medical history since our last visit reviewed. Allergies and medications reviewed and updated.  Review of Systems  Constitutional: Positive for fatigue. Negative for activity change, appetite change, chills, diaphoresis, fever and unexpected weight change.  HENT: Positive for congestion and sneezing. Negative for drooling, ear discharge, ear pain, facial swelling, hearing loss, mouth sores, nosebleeds, postnasal drip, rhinorrhea, sinus pain, sinus pressure, sore throat, tinnitus, trouble swallowing and voice change.   Respiratory: Positive for cough, chest tightness, shortness of breath and wheezing. Negative for apnea, choking and stridor.   Cardiovascular: Positive for chest pain. Negative for palpitations and leg swelling.  Musculoskeletal: Positive for back pain and myalgias. Negative for arthralgias, gait problem, joint swelling, neck pain and neck stiffness.  Neurological: Positive for dizziness and light-headedness. Negative for tremors, seizures, syncope, facial asymmetry, speech difficulty, weakness, numbness and headaches.  Psychiatric/Behavioral: Negative.     Per HPI unless specifically indicated above     Objective:    BP 111/66 (BP Location: Right Arm, Patient Position:  Sitting, Cuff Size: Large)   Pulse 68   Ht 5' 2.5" (1.588  m)   Wt 224 lb 12.8 oz (102 kg)   SpO2 93%   BMI 40.46 kg/m   Wt Readings from Last 3 Encounters:  07/14/16 224 lb 12.8 oz (102 kg)  03/31/16 217 lb 8 oz (98.7 kg)  01/31/16 209 lb 12.8 oz (95.2 kg)    Physical Exam  Constitutional: She is oriented to person, place, and time. She appears well-developed and well-nourished. She appears distressed.  visibly uncomfortable, short of breath, in wheelchair  HENT:  Head: Normocephalic and atraumatic.  Right Ear: Hearing normal.  Left Ear: Hearing normal.  Nose: Nose normal.  Eyes: Conjunctivae and lids are normal. Right eye exhibits no discharge. Left eye exhibits no discharge. No scleral icterus.  Cardiovascular: Normal rate, regular rhythm, normal heart sounds and intact distal pulses.  Exam reveals no gallop and no friction rub.   No murmur heard. Pulmonary/Chest: Effort normal. No respiratory distress. She has decreased breath sounds in the right upper field, the right middle field, the right lower field, the left upper field, the left middle field and the left lower field. She has no wheezes. She has no rales. She exhibits no tenderness.  Musculoskeletal:  Wearing back brace   Neurological: She is alert and oriented to person, place, and time.  Skin: Skin is intact. No rash noted. She is not diaphoretic.  Psychiatric: She has a normal mood and affect. Her speech is normal and behavior is normal. Judgment and thought content normal. Cognition and memory are normal.  Nursing note and vitals reviewed.   Results for orders placed or performed in visit on 01/31/16  Microscopic Examination  Result Value Ref Range   WBC, UA >30W 0 - 5 /hpf   RBC, UA >30 (A) 0 - 2 /hpf   Epithelial Cells (non renal) 0-10 0 - 10 /hpf   Bacteria, UA Moderate (A) None seen/Few  Bayer DCA Hb A1c Waived  Result Value Ref Range   Bayer DCA Hb A1c Waived 8.7 (H) <7.0 %  CBC with Differential/Platelet  Result Value Ref Range   WBC 8.5 3.4 - 10.8 x10E3/uL    RBC 4.24 3.77 - 5.28 x10E6/uL   Hemoglobin 11.8 11.1 - 15.9 g/dL   Hematocrit 38.1 34.0 - 46.6 %   MCV 90 79 - 97 fL   MCH 27.8 26.6 - 33.0 pg   MCHC 31.0 (L) 31.5 - 35.7 g/dL   RDW 15.5 (H) 12.3 - 15.4 %   Platelets 267 150 - 379 x10E3/uL   Neutrophils 54 Not Estab. %   Lymphs 33 Not Estab. %   Monocytes 10 Not Estab. %   Eos 3 Not Estab. %   Basos 0 Not Estab. %   Neutrophils Absolute 4.5 1.4 - 7.0 x10E3/uL   Lymphocytes Absolute 2.8 0.7 - 3.1 x10E3/uL   Monocytes Absolute 0.9 0.1 - 0.9 x10E3/uL   EOS (ABSOLUTE) 0.3 0.0 - 0.4 x10E3/uL   Basophils Absolute 0.0 0.0 - 0.2 x10E3/uL   Immature Granulocytes 0 Not Estab. %   Immature Grans (Abs) 0.0 0.0 - 0.1 x10E3/uL  Comprehensive metabolic panel  Result Value Ref Range   Glucose 123 (H) 65 - 99 mg/dL   BUN 21 8 - 27 mg/dL   Creatinine, Ser 1.32 (H) 0.57 - 1.00 mg/dL   GFR calc non Af Amer 40 (L) >59 mL/min/1.73   GFR calc Af Amer 46 (L) >59 mL/min/1.73   BUN/Creatinine  Ratio 16 12 - 28   Sodium 140 134 - 144 mmol/L   Potassium 5.2 3.5 - 5.2 mmol/L   Chloride 99 96 - 106 mmol/L   CO2 26 18 - 29 mmol/L   Calcium 9.5 8.7 - 10.3 mg/dL   Total Protein 6.9 6.0 - 8.5 g/dL   Albumin 4.0 3.5 - 4.8 g/dL   Globulin, Total 2.9 1.5 - 4.5 g/dL   Albumin/Globulin Ratio 1.4 1.2 - 2.2   Bilirubin Total 0.6 0.0 - 1.2 mg/dL   Alkaline Phosphatase 89 39 - 117 IU/L   AST 15 0 - 40 IU/L   ALT 12 0 - 32 IU/L  Lipid Panel Piccolo, Waived  Result Value Ref Range   Cholesterol Piccolo, Waived 152 <200 mg/dL   HDL Chol Piccolo, Waived 55 (L) >59 mg/dL   Triglycerides Piccolo,Waived 224 (H) <150 mg/dL   Chol/HDL Ratio Piccolo,Waive 2.7 mg/dL   LDL Chol Calc Piccolo Waived 52 <100 mg/dL   VLDL Chol Calc Piccolo,Waive 45 (H) <30 mg/dL  Microalbumin, Urine Waived  Result Value Ref Range   Microalb, Ur Waived 150 (H) 0 - 19 mg/L   Creatinine, Urine Waived 100 10 - 300 mg/dL   Microalb/Creat Ratio 30-300 (H) <30 mg/g  TSH  Result Value Ref Range     TSH 3.310 0.450 - 4.500 uIU/mL  UA/M w/rflx Culture, Routine  Result Value Ref Range   Specific Gravity, UA 1.010 1.005 - 1.030   pH, UA 5.5 5.0 - 7.5   Color, UA Yellow Yellow   Appearance Ur Clear Clear   Leukocytes, UA 3+ (A) Negative   Protein, UA 1+ (A) Negative/Trace   Glucose, UA Negative Negative   Ketones, UA Negative Negative   RBC, UA 2+ (A) Negative   Bilirubin, UA Negative Negative   Urobilinogen, Ur 0.2 0.2 - 1.0 mg/dL   Nitrite, UA Negative Negative   Microscopic Examination See below:    Urinalysis Reflex Comment   Hemoglobin A1c  Result Value Ref Range   Hemoglobin A1C 8.7   Urine Culture, Routine  Result Value Ref Range   Urine Culture, Routine Final report    Urine Culture result 1 Comment       Assessment & Plan:   Problem List Items Addressed This Visit      Cardiovascular and Mediastinum   Coronary atherosclerosis of native coronary artery    Under good control. Continue diet and exercise. Continue to monitor.       Relevant Orders   Comprehensive metabolic panel   CBC with Differential/Platelet   TSH   PAD (peripheral artery disease) ([place])    Continue to follow with cardiology. Will continue to keep BP and cholesterol under good control. Continue to monitor.       Relevant Orders   Comprehensive metabolic panel   CBC with Differential/Platelet   TSH   Acute pulmonary embolism (HCC)    SOB and feeling exactly like she did when she had a PE previously. STAT CTA ordered today- await results and will treat appropriately. Given low oxygen, likely she will have to go to the ER if positive.       Angina pectoris ([place])    Under good control. Continue diet and exercise. Continue to monitor.       Relevant Orders   Comprehensive metabolic panel   CBC with Differential/Platelet   TSH   Abdominal aortic atherosclerosis (HCC)   Relevant Orders   Comprehensive metabolic panel   CBC with Differential/Platelet  TSH     Endocrine   Diabetes  mellitus type 2 with complications, uncontrolled ([place])    Not under good control. A1c come back at 9.0 today. Will discuss starting metformin at follow up on Tuesday.      Relevant Medications   glipiZIDE (GLUCOTROL) 5 MG tablet   Other Relevant Orders   Comprehensive metabolic panel   CBC with Differential/Platelet   Bayer DCA Hb A1c Waived   Microalbumin, Urine Waived   TSH     Genitourinary   Benign hypertensive renal disease    Under good control. Continue current regimen. Continue to monitor. Call with any concerns.       Chronic renal insufficiency, stage III (moderate)    Rechecking labs today. Await results.       Relevant Orders   Comprehensive metabolic panel   CBC with Differential/Platelet   TSH   Frequent UTI    UA dirty again today. Will await culture, but will treat with cipro for now.       Relevant Orders   Comprehensive metabolic panel   CBC with Differential/Platelet   TSH   UA/M w/rflx Culture, Routine     Other   Hyperlipidemia    Rechecking levels today. Await results. Continue to monitor. Continue current regimen. Call with any concerns.       Relevant Orders   Comprehensive metabolic panel   CBC with Differential/Platelet   Lipid Panel w/o Chol/HDL Ratio   TSH   Spinal stenosis of lumbar region    Not under good control- will recheck on Tuesday at follow up.       Other Visit Diagnoses    SOB (shortness of breath)    -  Primary   Will obtain STAT CTA- await results.    Relevant Orders   CT Angio Chest W/Cm &/Or Wo Cm   Encounter for hepatitis C screening test for low risk patient       Labs drawn today. Await results.    Relevant Orders   Hepatitis C antibody   History of pulmonary embolism       Relevant Orders   CT Angio Chest W/Cm &/Or Wo Cm       Follow up plan: Return Tuesday, follow up.

## 2016-07-14 NOTE — Telephone Encounter (Signed)
Called from the CT department. Patient refused IV dye. She states that she cannot have it as he kidneys will not clear it. Discussed risks and benefits if she has a PE. Advised her that if she doesn't want to do outpatient CTA, I strongly advise her to go to an ER- and it didn't have to be [facility]. She refused. Will call her cardiologist as they found it originally. Again advised patient to go to the ER. She is aware of the risk if she doesn't go.

## 2016-07-14 NOTE — Assessment & Plan Note (Signed)
UA dirty again today. Will await culture, but will treat with cipro for now.

## 2016-07-14 NOTE — Assessment & Plan Note (Signed)
Not under good control. A1c come back at 9.0 today. Will discuss starting metformin at follow up on Tuesday.

## 2016-07-14 NOTE — Assessment & Plan Note (Signed)
Continue to follow with cardiology. Will continue to keep BP and cholesterol under good control. Continue to monitor.

## 2016-07-14 NOTE — Assessment & Plan Note (Signed)
Not under good control- will recheck on Tuesday at follow up.

## 2016-07-14 NOTE — Assessment & Plan Note (Signed)
Rechecking levels today. Await results. Continue to monitor. Continue current regimen. Call with any concerns.

## 2016-07-15 LAB — LIPID PANEL W/O CHOL/HDL RATIO
Cholesterol, Total: 145 mg/dL (ref 100–199)
HDL: 39 mg/dL — ABNORMAL LOW (ref >39)
LDL Calculated: 40 mg/dL (ref 0–99)
Triglycerides: 332 mg/dL — ABNORMAL HIGH (ref 0–149)
VLDL Cholesterol Cal: 66 mg/dL — ABNORMAL HIGH (ref 5–40)

## 2016-07-15 LAB — CBC WITH DIFFERENTIAL/PLATELET
Basophils Absolute: 0.1 10*3/uL (ref 0.0–0.2)
Basos: 1 %
EOS (ABSOLUTE): 0.3 10*3/uL (ref 0.0–0.4)
Eos: 3 %
Hematocrit: 39.8 % (ref 34.0–46.6)
Hemoglobin: 12.3 g/dL (ref 11.1–15.9)
Immature Grans (Abs): 0.1 10*3/uL (ref 0.0–0.1)
Immature Granulocytes: 1 %
Lymphocytes Absolute: 3.1 10*3/uL (ref 0.7–3.1)
Lymphs: 34 %
MCH: 28.8 pg (ref 26.6–33.0)
MCHC: 30.9 g/dL — ABNORMAL LOW (ref 31.5–35.7)
MCV: 93 fL (ref 79–97)
Monocytes Absolute: 0.7 10*3/uL (ref 0.1–0.9)
Monocytes: 8 %
Neutrophils Absolute: 4.8 10*3/uL (ref 1.4–7.0)
Neutrophils: 53 %
Platelets: 268 10*3/uL (ref 150–379)
RBC: 4.27 x10E6/uL (ref 3.77–5.28)
RDW: 15 % (ref 12.3–15.4)
WBC: 9 10*3/uL (ref 3.4–10.8)

## 2016-07-15 LAB — TSH: TSH: 15.41 u[IU]/mL — ABNORMAL HIGH (ref 0.450–4.500)

## 2016-07-15 LAB — COMPREHENSIVE METABOLIC PANEL
ALT: 11 IU/L (ref 0–32)
AST: 11 IU/L (ref 0–40)
Albumin/Globulin Ratio: 1.5 (ref 1.2–2.2)
Albumin: 4.1 g/dL (ref 3.5–4.8)
Alkaline Phosphatase: 111 IU/L (ref 39–117)
BUN/Creatinine Ratio: 15 (ref 12–28)
BUN: 27 mg/dL (ref 8–27)
Bilirubin Total: 0.4 mg/dL (ref 0.0–1.2)
CO2: 23 mmol/L (ref 18–29)
Calcium: 9.2 mg/dL (ref 8.7–10.3)
Chloride: 96 mmol/L (ref 96–106)
Creatinine, Ser: 1.82 mg/dL — ABNORMAL HIGH (ref 0.57–1.00)
GFR calc Af Amer: 32 mL/min/{1.73_m2} — ABNORMAL LOW (ref >59)
GFR calc non Af Amer: 27 mL/min/{1.73_m2} — ABNORMAL LOW (ref >59)
Globulin, Total: 2.7 g/dL (ref 1.5–4.5)
Glucose: 368 mg/dL — ABNORMAL HIGH (ref 65–99)
Potassium: 5.1 mmol/L (ref 3.5–5.2)
Sodium: 136 mmol/L (ref 134–144)
Total Protein: 6.8 g/dL (ref 6.0–8.5)

## 2016-07-15 LAB — HEPATITIS C ANTIBODY: Hep C Virus Ab: <0.1 s/co ratio (ref 0.0–0.9)

## 2016-07-17 ENCOUNTER — Telehealth: Payer: Self-pay | Admitting: Family Medicine

## 2016-07-17 MED ORDER — LEVOTHYROXINE SODIUM 125 MCG PO TABS: 125.0000 ug | ORAL_TABLET | Freq: Every day | ORAL | 3 refills | Status: DC

## 2016-07-17 NOTE — Telephone Encounter (Signed)
Called and spoke to patient. She states that she is feeling better. She notes that she didn't call her cardiologist. She states that the last time she had her PE diagnosed it was just through a blood test- previous labs reviewed at [facility] and I do not see d-dimer, + d-dimer in the past at [facility] about a year ago.  Discussed with the patient that this is not necessarily accurate and may be elevated for several reasons, including her recent back surgery. She again states that she will call her cardiologist.   She notes that her breathing is better today.   Discussed results with patient including elevated TSH- will increase her synthroid to 17mg and recheck in 6 weeks. Already being treated for UTI. Renal function also down. Will push fluids and recheck next visit. Patient requested that copy of her labs be sent to Dr. MFrancoise Schaumann her endocrinologist. Labs will be forwarded.   A1c up to 9.0- will get her feeling a bit better before adding additional agent at patient's request.

## 2016-07-18 ENCOUNTER — Encounter: Payer: Self-pay | Admitting: Family Medicine

## 2016-07-18 ENCOUNTER — Ambulatory Visit (INDEPENDENT_AMBULATORY_CARE_PROVIDER_SITE_OTHER): Payer: Medicare Other | Admitting: Family Medicine

## 2016-07-18 VITALS — BP 115/71 | HR 69 | Temp 97.8°F | Wt 221.2 lb

## 2016-07-18 DIAGNOSIS — N3000 Acute cystitis without hematuria: Secondary | ICD-10-CM

## 2016-07-18 DIAGNOSIS — R0602 Shortness of breath: Secondary | ICD-10-CM

## 2016-07-18 DIAGNOSIS — G8929 Other chronic pain: Secondary | ICD-10-CM

## 2016-07-18 DIAGNOSIS — J449 Chronic obstructive pulmonary disease, unspecified: Secondary | ICD-10-CM | POA: Diagnosis not present

## 2016-07-18 DIAGNOSIS — M5441 Lumbago with sciatica, right side: Secondary | ICD-10-CM

## 2016-07-18 DIAGNOSIS — E039 Hypothyroidism, unspecified: Secondary | ICD-10-CM

## 2016-07-18 DIAGNOSIS — I209 Angina pectoris, unspecified: Secondary | ICD-10-CM | POA: Diagnosis not present

## 2016-07-18 DIAGNOSIS — I499 Cardiac arrhythmia, unspecified: Secondary | ICD-10-CM | POA: Diagnosis not present

## 2016-07-18 MED ORDER — LEVOTHYROXINE SODIUM 125 MCG PO TABS: 125.0000 ug | ORAL_TABLET | Freq: Every day | ORAL | 3 refills | Status: DC

## 2016-07-18 NOTE — Assessment & Plan Note (Signed)
Following with orthopedics. This has been improving. Likely needs PT. Call with any concerns.

## 2016-07-18 NOTE — Assessment & Plan Note (Addendum)
Will start her on anoro and recheck in 6 weeks. Call with any concerns.

## 2016-07-18 NOTE — Progress Notes (Signed)
BP 115/71 (BP Location: Left Arm, Patient Position: Sitting, Cuff Size: Large)   Pulse 69   Temp 97.8 F (36.6 C)   Wt 221 lb 3.2 oz (100.3 kg)   SpO2 96%   BMI 39.81 kg/m    Subjective:    Patient ID: Tammy [name], female    DOB: 1945/06/05, 71 y.o.   MRN: [id number]  HPI: Tammy [name] is a 71 y.o. female  Chief Complaint  Patient presents with  . Shortness of Breath  . Leg Pain   She notes that she is feeling much better. She is not having as much SOB, has been breathing much better. Still not quite right, but better. Does notice some wheezing.    She notes that her R leg has been feeling really heavy. When she first had the surgery she had severe pain in her R leg. She is not having pain in her leg any more. She has trouble going up and down stairs. She is not seeing her orthopedist for another month or so. She just saw them and told them about the heaviness.   Urine is feeling good. She notes that she doesn't usually notice when she has UTI- it took her a long time to get better.   She has not noticed any difference in her thyroid just now. Just started the new medicine this AM. Otherwise doing well.   Relevant past medical, surgical, family and social history reviewed and updated as indicated. Interim medical history since our last visit reviewed. Allergies and medications reviewed and updated.  Review of Systems  Constitutional: Negative.   Respiratory: Positive for chest tightness, shortness of breath and wheezing. Negative for apnea, cough, choking and stridor.   Cardiovascular: Negative.   Musculoskeletal: Positive for arthralgias, back pain, gait problem and myalgias. Negative for joint swelling, neck pain and neck stiffness.  Psychiatric/Behavioral: Negative.     Per HPI unless specifically indicated above     Objective:    BP 115/71 (BP Location: Left Arm, Patient Position: Sitting, Cuff Size: Large)   Pulse 69   Temp 97.8 F (36.6 C)   Wt 221 lb 3.2 oz  (100.3 kg)   SpO2 96%   BMI 39.81 kg/m   Wt Readings from Last 3 Encounters:  07/18/16 221 lb 3.2 oz (100.3 kg)  07/14/16 224 lb 12.8 oz (102 kg)  03/31/16 217 lb 8 oz (98.7 kg)    Physical Exam  Constitutional: She is oriented to person, place, and time. She appears well-developed and well-nourished. No distress.  HENT:  Head: Normocephalic and atraumatic.  Right Ear: Hearing normal.  Left Ear: Hearing normal.  Nose: Nose normal.  Eyes: Conjunctivae and lids are normal. Right eye exhibits no discharge. Left eye exhibits no discharge. No scleral icterus.  Cardiovascular: Normal rate and intact distal pulses.  An irregular rhythm present. Exam reveals no gallop and no friction rub.   No murmur heard. Pulmonary/Chest: Effort normal. No respiratory distress. She has wheezes (scattered on R lung). She has no rales. She exhibits no tenderness.  Musculoskeletal: Normal range of motion.  Neurological: She is alert and oriented to person, place, and time.  Skin: Skin is warm and intact. No rash noted. She is not diaphoretic. No erythema. No pallor.  Psychiatric: She has a normal mood and affect. Her speech is normal and behavior is normal. Judgment and thought content normal. Cognition and memory are normal.  Nursing note and vitals reviewed.   Results for orders placed  or performed in visit on 07/14/16  Microscopic Examination  Result Value Ref Range   WBC, UA 11-30 (A) 0 - 5 /hpf   RBC, UA 3-10 (A) 0 - 2 /hpf   Epithelial Cells (non renal) 0-10 0 - 10 /hpf   Bacteria, UA Moderate (A) None seen/Few  Comprehensive metabolic panel  Result Value Ref Range   Glucose 368 (H) 65 - 99 mg/dL   BUN 27 8 - 27 mg/dL   Creatinine, Ser 1.82 (H) 0.57 - 1.00 mg/dL   GFR calc non Af Amer 27 (L) >59 mL/min/1.73   GFR calc Af Amer 32 (L) >59 mL/min/1.73   BUN/Creatinine Ratio 15 12 - 28   Sodium 136 134 - 144 mmol/L   Potassium 5.1 3.5 - 5.2 mmol/L   Chloride 96 96 - 106 mmol/L   CO2 23 18 - 29  mmol/L   Calcium 9.2 8.7 - 10.3 mg/dL   Total Protein 6.8 6.0 - 8.5 g/dL   Albumin 4.1 3.5 - 4.8 g/dL   Globulin, Total 2.7 1.5 - 4.5 g/dL   Albumin/Globulin Ratio 1.5 1.2 - 2.2   Bilirubin Total 0.4 0.0 - 1.2 mg/dL   Alkaline Phosphatase 111 39 - 117 IU/L   AST 11 0 - 40 IU/L   ALT 11 0 - 32 IU/L  CBC with Differential/Platelet  Result Value Ref Range   WBC 9.0 3.4 - 10.8 x10E3/uL   RBC 4.27 3.77 - 5.28 x10E6/uL   Hemoglobin 12.3 11.1 - 15.9 g/dL   Hematocrit 39.8 34.0 - 46.6 %   MCV 93 79 - 97 fL   MCH 28.8 26.6 - 33.0 pg   MCHC 30.9 (L) 31.5 - 35.7 g/dL   RDW 15.0 12.3 - 15.4 %   Platelets 268 150 - 379 x10E3/uL   Neutrophils 53 Not Estab. %   Lymphs 34 Not Estab. %   Monocytes 8 Not Estab. %   Eos 3 Not Estab. %   Basos 1 Not Estab. %   Neutrophils Absolute 4.8 1.4 - 7.0 x10E3/uL   Lymphocytes Absolute 3.1 0.7 - 3.1 x10E3/uL   Monocytes Absolute 0.7 0.1 - 0.9 x10E3/uL   EOS (ABSOLUTE) 0.3 0.0 - 0.4 x10E3/uL   Basophils Absolute 0.1 0.0 - 0.2 x10E3/uL   Immature Granulocytes 1 Not Estab. %   Immature Grans (Abs) 0.1 0.0 - 0.1 x10E3/uL  Bayer DCA Hb A1c Waived  Result Value Ref Range   Bayer DCA Hb A1c Waived 9.0 (H) <7.0 %  Lipid Panel w/o Chol/HDL Ratio  Result Value Ref Range   Cholesterol, Total 145 100 - 199 mg/dL   Triglycerides 332 (H) 0 - 149 mg/dL   HDL 39 (L) >39 mg/dL   VLDL Cholesterol Cal 66 (H) 5 - 40 mg/dL   LDL Calculated 40 0 - 99 mg/dL  Microalbumin, Urine Waived  Result Value Ref Range   Microalb, Ur Waived 150 (H) 0 - 19 mg/L   Creatinine, Urine Waived 50 10 - 300 mg/dL   Microalb/Creat Ratio >300 (H) <30 mg/g  TSH  Result Value Ref Range   TSH 15.410 (H) 0.450 - 4.500 uIU/mL  UA/M w/rflx Culture, Routine  Result Value Ref Range   Specific Gravity, UA 1.015 1.005 - 1.030   pH, UA 6.0 5.0 - 7.5   Color, UA Yellow Yellow   Appearance Ur Hazy (A) Clear   Leukocytes, UA 2+ (A) Negative   Protein, UA 1+ (A) Negative/Trace   Glucose, UA 2+  (A)  Negative   Ketones, UA Negative Negative   RBC, UA Trace (A) Negative   Bilirubin, UA Negative Negative   Urobilinogen, Ur 0.2 0.2 - 1.0 mg/dL   Nitrite, UA Negative Negative   Microscopic Examination See below:    Urinalysis Reflex Comment   Hepatitis C antibody  Result Value Ref Range   Hep C Virus Ab <0.1 0.0 - 0.9 s/co ratio  Urine Culture, Routine  Result Value Ref Range   Urine Culture, Routine Preliminary report (A)    Urine Culture result 1 Gram negative rods (A)       Assessment & Plan:   Problem List Items Addressed This Visit      Respiratory   COPD (chronic obstructive pulmonary disease) ([place])    Will start her on anoro and recheck in 6 weeks. Call with any concerns.         Endocrine   Hypothyroid    Will continue her thyroid medicine and recheck in 6 weeks. Await results.       Relevant Medications   levothyroxine (SYNTHROID, LEVOTHROID) 125 MCG tablet     Other   Back pain    Following with orthopedics. This has been improving. Likely needs PT. Call with any concerns.        Other Visit Diagnoses    SOB (shortness of breath)    -  Primary   Wheeze today, O2 sat improved. Checking spiro which shows COPD- will start advair and recheck at follow up.   Relevant Orders   Spirometry with Graph (Completed)   Acute cystitis without hematuria       Doing better on her cipro- will recheck urine on Monday. Await results.    Relevant Orders   UA/M w/rflx Culture, Routine   Irregular heart rhythm       EKG normal today. No history of A. Fib. Continue to monitor.    Relevant Orders   EKG 12-Lead (Completed)       Follow up plan: Return in about 6 weeks (around 08/29/2016) for Thyroid recheck.

## 2016-07-18 NOTE — Assessment & Plan Note (Signed)
Will continue her thyroid medicine and recheck in 6 weeks. Await results.

## 2016-07-19 LAB — UA/M W/RFLX CULTURE, ROUTINE
Bilirubin, UA: NEGATIVE
Ketones, UA: NEGATIVE
Nitrite, UA: NEGATIVE
Specific Gravity, UA: 1.015 (ref 1.005–1.030)
Urobilinogen, Ur: 0.2 mg/dL (ref 0.2–1.0)
pH, UA: 6 (ref 5.0–7.5)

## 2016-07-19 LAB — BAYER DCA HB A1C WAIVED: HB A1C (BAYER DCA - WAIVED): 9 % — ABNORMAL HIGH (ref <7.0)

## 2016-07-19 LAB — MICROALBUMIN, URINE WAIVED
Creatinine, Urine Waived: 50 mg/dL (ref 10–300)
Microalb, Ur Waived: 150 mg/L — ABNORMAL HIGH (ref 0–19)
Microalb/Creat Ratio: >300 mg/g — ABNORMAL HIGH (ref <30)

## 2016-07-19 LAB — URINE CULTURE, REFLEX

## 2016-07-19 LAB — MICROSCOPIC EXAMINATION

## 2016-07-24 ENCOUNTER — Other Ambulatory Visit: Payer: Medicare Other

## 2016-07-24 DIAGNOSIS — N3000 Acute cystitis without hematuria: Secondary | ICD-10-CM | POA: Diagnosis not present

## 2016-07-27 ENCOUNTER — Telehealth: Payer: Self-pay | Admitting: Family Medicine

## 2016-07-27 LAB — UA/M W/RFLX CULTURE, ROUTINE
Bilirubin, UA: NEGATIVE
Glucose, UA: NEGATIVE
Ketones, UA: NEGATIVE
Nitrite, UA: NEGATIVE
Specific Gravity, UA: 1.02 (ref 1.005–1.030)
Urobilinogen, Ur: 0.2 mg/dL (ref 0.2–1.0)
pH, UA: 6 (ref 5.0–7.5)

## 2016-07-27 LAB — MICROSCOPIC EXAMINATION
Epithelial Cells (non renal): >10 /hpf — AB (ref 0–10)
RBC, UA: NONE SEEN /hpf (ref 0–?)

## 2016-07-27 LAB — URINE CULTURE, REFLEX

## 2016-07-27 NOTE — Telephone Encounter (Signed)
Please let her know that her urine was contaminated, but did not have any sign of an infection. Thanks!

## 2016-07-27 NOTE — Telephone Encounter (Signed)
Message relayed to patient. Verbalized understanding and denied questions.   

## 2016-07-27 NOTE — Telephone Encounter (Signed)
Patient called to get her results from urine specimen.  Thanks  [phone]

## 2016-07-27 NOTE — Telephone Encounter (Signed)
Please advise, culture is not back.

## 2016-08-10 ENCOUNTER — Other Ambulatory Visit: Payer: Self-pay | Admitting: Family Medicine

## 2016-08-17 DIAGNOSIS — M9983 Other biomechanical lesions of lumbar region: Secondary | ICD-10-CM | POA: Diagnosis not present

## 2016-08-20 DIAGNOSIS — M5127 Other intervertebral disc displacement, lumbosacral region: Secondary | ICD-10-CM | POA: Diagnosis not present

## 2016-08-24 DIAGNOSIS — M5431 Sciatica, right side: Secondary | ICD-10-CM | POA: Diagnosis not present

## 2016-08-29 ENCOUNTER — Encounter: Payer: Self-pay | Admitting: Family Medicine

## 2016-08-29 ENCOUNTER — Ambulatory Visit (INDEPENDENT_AMBULATORY_CARE_PROVIDER_SITE_OTHER): Payer: Medicare Other | Admitting: Family Medicine

## 2016-08-29 VITALS — BP 113/68 | HR 71 | Temp 98.1°F | Wt 222.2 lb

## 2016-08-29 DIAGNOSIS — I209 Angina pectoris, unspecified: Secondary | ICD-10-CM | POA: Diagnosis not present

## 2016-08-29 DIAGNOSIS — J449 Chronic obstructive pulmonary disease, unspecified: Secondary | ICD-10-CM

## 2016-08-29 DIAGNOSIS — E039 Hypothyroidism, unspecified: Secondary | ICD-10-CM

## 2016-08-29 DIAGNOSIS — Z9889 Other specified postprocedural states: Secondary | ICD-10-CM

## 2016-08-29 DIAGNOSIS — M48061 Spinal stenosis, lumbar region without neurogenic claudication: Secondary | ICD-10-CM | POA: Diagnosis not present

## 2016-08-29 DIAGNOSIS — R002 Palpitations: Secondary | ICD-10-CM | POA: Diagnosis not present

## 2016-08-29 DIAGNOSIS — M5430 Sciatica, unspecified side: Secondary | ICD-10-CM | POA: Diagnosis not present

## 2016-08-29 NOTE — Assessment & Plan Note (Signed)
Referral to PT made today. Continue to follow with orthopedics. Call with any concerns.

## 2016-08-29 NOTE — Assessment & Plan Note (Signed)
Rechecking levels. Will adjust dose as needed. Call with any concerns.

## 2016-08-29 NOTE — Assessment & Plan Note (Signed)
Stopped her medication. Refuses Arlyce Harman. States she feels well.

## 2016-08-29 NOTE — Progress Notes (Signed)
BP 113/68 (BP Location: Left Arm, Patient Position: Sitting, Cuff Size: Large)   Pulse 71   Temp 98.1 F (36.7 C)   Wt 222 lb 4 oz (100.8 kg)   SpO2 96%   BMI 40.00 kg/m    Subjective:    Patient ID: Tammy [name], female    DOB: Jul 07, 1945, 71 y.o.   MRN: [id number]  HPI: Tammy [name] is a 71 y.o. female  Chief Complaint  Patient presents with  . Hypothyroidism   HYPOTHYROIDISM Thyroid control status:better Satisfied with current treatment? yes Medication side effects: no Medication compliance: excellent compliance Recent dose adjustment:yes Fatigue: yes Cold intolerance: no Heat intolerance: no Weight gain: no Weight loss: no Constipation: no Diarrhea/loose stools: no Palpitations: yes Lower extremity edema: no Anxiety/depressed mood: yes  COPD- has not been using her anoro. She declines doing another spiro.  COPD status: better Satisfied with current treatment?: yes Oxygen use: no Dyspnea frequency: rarely Cough frequency: rarely Rescue inhaler frequency: rarely  Limitation of activity: no Productive cough: no  Seeing the back doctor. Is still in a lot of pain. Had MRI which showed no changes. She is supposed to be getting a work up for neuropathy. She notes that she continues with a lot of pain in her legs. She notes that she did PT after surgery and would feel worse after she saw the PT. She would be interested in PT again.  PALPITATIONS Duration: months Symptom description: heart flipping and makes her catch her breath Duration of episode: seconds Frequency: recurrentl Activity when event occurred: at random, usually when getting ready for bed Related to exertion: no Dyspnea: yes Chest pain: no Syncope: no Anxiety/stress: no Nausea/vomiting: no Diaphoresis: no Coronary artery disease: yes Congestive heart failure: no Arrhythmia:no Thyroid disease: yes Status:  fluctuating Treatments attempted:none  Relevant past medical, surgical, family and  social history reviewed and updated as indicated. Interim medical history since our last visit reviewed. Allergies and medications reviewed and updated.  Review of Systems  Respiratory: Negative.   Cardiovascular: Negative.   Musculoskeletal: Positive for arthralgias, back pain, gait problem and myalgias. Negative for joint swelling, neck pain and neck stiffness.  Neurological: Positive for weakness. Negative for dizziness, tremors, seizures, syncope, facial asymmetry, speech difficulty, light-headedness, numbness and headaches.  Psychiatric/Behavioral: Negative.     Per HPI unless specifically indicated above     Objective:    BP 113/68 (BP Location: Left Arm, Patient Position: Sitting, Cuff Size: Large)   Pulse 71   Temp 98.1 F (36.7 C)   Wt 222 lb 4 oz (100.8 kg)   SpO2 96%   BMI 40.00 kg/m   Wt Readings from Last 3 Encounters:  08/29/16 222 lb 4 oz (100.8 kg)  07/18/16 221 lb 3.2 oz (100.3 kg)  07/14/16 224 lb 12.8 oz (102 kg)    Physical Exam  Constitutional: She is oriented to person, place, and time. She appears well-developed and well-nourished. No distress.  HENT:  Head: Normocephalic and atraumatic.  Right Ear: Hearing and external ear normal.  Left Ear: Hearing and external ear normal.  Nose: Nose normal.  Mouth/Throat: Oropharynx is clear and moist. No oropharyngeal exudate.  Eyes: Conjunctivae, EOM and lids are normal. Pupils are equal, round, and reactive to light. Right eye exhibits no discharge. Left eye exhibits no discharge. No scleral icterus.  Neck: Normal range of motion. Neck supple. No JVD present. No tracheal deviation present. No thyromegaly present.  Cardiovascular: Normal rate, regular rhythm, normal heart sounds  and intact distal pulses.  Exam reveals no gallop and no friction rub.   No murmur heard. Pulmonary/Chest: Effort normal and breath sounds normal. No stridor. No respiratory distress. She has no wheezes. She has no rales. She exhibits no  tenderness.  Abdominal: Soft. Bowel sounds are normal. She exhibits no distension and no mass. There is no tenderness. There is no rebound and no guarding.  Musculoskeletal: Normal range of motion.  Lymphadenopathy:    She has no cervical adenopathy.  Neurological: She is alert and oriented to person, place, and time.  Skin: Skin is intact. No rash noted. She is not diaphoretic.  Psychiatric: She has a normal mood and affect. Her speech is normal and behavior is normal. Judgment and thought content normal. Cognition and memory are normal.  Nursing note and vitals reviewed.   Results for orders placed or performed in visit on 07/24/16  Microscopic Examination  Result Value Ref Range   WBC, UA 11-30 (A) 0 - 5 /hpf   RBC, UA None seen 0 - 2 /hpf   Epithelial Cells (non renal) >10 (A) 0 - 10 /hpf   Bacteria, UA Few None seen/Few   Yeast, UA Present (A) None seen  UA/M w/rflx Culture, Routine  Result Value Ref Range   Specific Gravity, UA 1.020 1.005 - 1.030   pH, UA 6.0 5.0 - 7.5   Color, UA Yellow Yellow   Appearance Ur Cloudy (A) Clear   Leukocytes, UA 1+ (A) Negative   Protein, UA 2+ (A) Negative/Trace   Glucose, UA Negative Negative   Ketones, UA Negative Negative   RBC, UA Trace (A) Negative   Bilirubin, UA Negative Negative   Urobilinogen, Ur 0.2 0.2 - 1.0 mg/dL   Nitrite, UA Negative Negative   Microscopic Examination See below:    Urinalysis Reflex Comment   Urine Culture, Routine  Result Value Ref Range   Urine Culture, Routine Final report    Organism ID, Bacteria Comment       Assessment & Plan:   Problem List Items Addressed This Visit      Respiratory   COPD (chronic obstructive pulmonary disease) ([place])    Stopped her medication. Refuses Arlyce Harman. States she feels well.         Endocrine   Hypothyroid - Primary    Rechecking levels. Will adjust dose as needed. Call with any concerns.       Relevant Orders   TSH     Nervous and Auditory   Neuralgia  neuritis, sciatic nerve    Referral to PT made today. Continue to follow with orthopedics. Call with any concerns.         Other   History of back surgery    Referral to PT made today. Continue to follow with orthopedics. Call with any concerns.       Spinal stenosis of lumbar region    Referral to PT made today. Continue to follow with orthopedics. Call with any concerns.        Other Visit Diagnoses    Palpitations       Rechecking thyroid. Advised her to make Dr. [name] aware at her appointment in July. She states that she will.        Follow up plan: Return in about 2 months (around 10/29/2016) for Follow up Diabetes.

## 2016-08-30 ENCOUNTER — Telehealth: Payer: Self-pay | Admitting: Family Medicine

## 2016-08-30 ENCOUNTER — Other Ambulatory Visit: Payer: Self-pay | Admitting: Cardiovascular Disease

## 2016-08-30 LAB — TSH: TSH: 9.22 u[IU]/mL — ABNORMAL HIGH (ref 0.450–4.500)

## 2016-08-30 MED ORDER — LEVOTHYROXINE SODIUM 150 MCG PO TABS: 150.0000 ug | ORAL_TABLET | Freq: Every day | ORAL | 3 refills | Status: DC

## 2016-08-30 NOTE — Telephone Encounter (Signed)
Patient notified

## 2016-08-30 NOTE — Telephone Encounter (Signed)
Please let her know that her thyroid is still not adequately treated, so I'm increasing her dose. The new Rx will be at the pharmacy. We can recheck it at her follow up in August. Thanks!

## 2016-09-07 ENCOUNTER — Other Ambulatory Visit: Payer: Self-pay | Admitting: Cardiovascular Disease

## 2016-09-12 DIAGNOSIS — M545 Low back pain: Secondary | ICD-10-CM | POA: Diagnosis not present

## 2016-09-12 DIAGNOSIS — M5416 Radiculopathy, lumbar region: Secondary | ICD-10-CM | POA: Diagnosis not present

## 2016-09-12 DIAGNOSIS — M961 Postlaminectomy syndrome, not elsewhere classified: Secondary | ICD-10-CM | POA: Diagnosis not present

## 2016-09-25 DIAGNOSIS — F451 Undifferentiated somatoform disorder: Secondary | ICD-10-CM | POA: Diagnosis not present

## 2016-10-02 ENCOUNTER — Other Ambulatory Visit (INDEPENDENT_AMBULATORY_CARE_PROVIDER_SITE_OTHER): Payer: Self-pay | Admitting: Vascular Surgery

## 2016-10-02 DIAGNOSIS — I739 Peripheral vascular disease, unspecified: Secondary | ICD-10-CM

## 2016-10-02 DIAGNOSIS — M79604 Pain in right leg: Secondary | ICD-10-CM | POA: Diagnosis not present

## 2016-10-03 ENCOUNTER — Other Ambulatory Visit: Payer: Self-pay | Admitting: Cardiovascular Disease

## 2016-10-04 ENCOUNTER — Ambulatory Visit (INDEPENDENT_AMBULATORY_CARE_PROVIDER_SITE_OTHER): Payer: Medicare Other | Admitting: Vascular Surgery

## 2016-10-04 ENCOUNTER — Encounter (INDEPENDENT_AMBULATORY_CARE_PROVIDER_SITE_OTHER): Payer: Medicare Other

## 2016-10-04 ENCOUNTER — Ambulatory Visit (INDEPENDENT_AMBULATORY_CARE_PROVIDER_SITE_OTHER): Payer: Medicare Other

## 2016-10-04 DIAGNOSIS — I739 Peripheral vascular disease, unspecified: Secondary | ICD-10-CM

## 2016-10-10 ENCOUNTER — Encounter (INDEPENDENT_AMBULATORY_CARE_PROVIDER_SITE_OTHER): Payer: Self-pay | Admitting: Vascular Surgery

## 2016-10-12 ENCOUNTER — Other Ambulatory Visit: Payer: Self-pay

## 2016-10-12 MED ORDER — LOSARTAN POTASSIUM 50 MG PO TABS: 50.0000 mg | ORAL_TABLET | Freq: Every day | ORAL | 0 refills | Status: DC

## 2016-10-12 NOTE — Telephone Encounter (Signed)
Requested Prescriptions   Signed Prescriptions Disp Refills  . losartan (COZAAR) 50 MG tablet 30 tablet 0    Sig: Take 1 tablet (50 mg total) by mouth daily.    Authorizing Provider: [name]    Ordering User: Janan Ridge

## 2016-10-13 ENCOUNTER — Telehealth: Payer: Self-pay | Admitting: Family Medicine

## 2016-10-13 DIAGNOSIS — N189 Chronic kidney disease, unspecified: Secondary | ICD-10-CM

## 2016-10-13 DIAGNOSIS — N179 Acute kidney failure, unspecified: Secondary | ICD-10-CM

## 2016-10-13 DIAGNOSIS — I129 Hypertensive chronic kidney disease with stage 1 through stage 4 chronic kidney disease, or unspecified chronic kidney disease: Secondary | ICD-10-CM

## 2016-10-13 NOTE — Telephone Encounter (Signed)
Forwarding to provider.

## 2016-10-13 NOTE — Telephone Encounter (Signed)
Pat from [facility] is faxing over an order for patient to get her CBS and chem 7 labs drawn at the pcp office due to location conveince for the patient.  Please Advise.  Thank you

## 2016-10-13 NOTE — Telephone Encounter (Signed)
Since we're not an independent drawing station, that doesn't work, however, I've put the orders in, so she can get them drawn here.

## 2016-10-13 NOTE — Telephone Encounter (Signed)
Patient notified

## 2016-10-24 ENCOUNTER — Telehealth: Payer: Self-pay | Admitting: Family Medicine

## 2016-10-24 NOTE — Telephone Encounter (Signed)
Pat with [facility] needs to speak to someone regarding an order that was sent here for some labs needed on this patient.  She needs to know if they have been taken care of   Fraser Din [phone]  Thanks

## 2016-10-24 NOTE — Telephone Encounter (Signed)
Fax lab results to [facility] at [phone]

## 2016-10-25 ENCOUNTER — Ambulatory Visit (INDEPENDENT_AMBULATORY_CARE_PROVIDER_SITE_OTHER): Payer: Medicare Other

## 2016-10-25 VITALS — BP 126/75 | HR 67 | Temp 97.5°F | Resp 16 | Ht 62.0 in | Wt 221.4 lb

## 2016-10-25 DIAGNOSIS — M5416 Radiculopathy, lumbar region: Secondary | ICD-10-CM | POA: Diagnosis not present

## 2016-10-25 DIAGNOSIS — M5440 Lumbago with sciatica, unspecified side: Secondary | ICD-10-CM | POA: Diagnosis not present

## 2016-10-25 DIAGNOSIS — I129 Hypertensive chronic kidney disease with stage 1 through stage 4 chronic kidney disease, or unspecified chronic kidney disease: Secondary | ICD-10-CM | POA: Diagnosis not present

## 2016-10-25 DIAGNOSIS — N189 Chronic kidney disease, unspecified: Secondary | ICD-10-CM | POA: Diagnosis not present

## 2016-10-25 DIAGNOSIS — N179 Acute kidney failure, unspecified: Secondary | ICD-10-CM

## 2016-10-25 DIAGNOSIS — M47814 Spondylosis without myelopathy or radiculopathy, thoracic region: Secondary | ICD-10-CM | POA: Diagnosis not present

## 2016-10-25 DIAGNOSIS — Z0000 Encounter for general adult medical examination without abnormal findings: Secondary | ICD-10-CM

## 2016-10-25 DIAGNOSIS — M545 Low back pain: Secondary | ICD-10-CM | POA: Diagnosis not present

## 2016-10-25 NOTE — Patient Instructions (Addendum)
Tammy [name] , Thank you for taking time to come for your Medicare Wellness Visit. I appreciate your ongoing commitment to your health goals. Please review the following plan we discussed and let me know if I can assist you in the future.   Screening recommendations/referrals: Colonoscopy: completed 06/24/2015 Mammogram: due- declined at this time  Bone Density: completed 04/20/2015 Recommended yearly ophthalmology/optometry visit for glaucoma screening and checkup Recommended yearly dental visit for hygiene and checkup  Vaccinations: Influenza vaccine: up to date, due 11/2016 Pneumococcal vaccine: up to date Tdap vaccine: due, check with your insurance company for coverage Shingles vaccine: up to date   Advanced directives: Please bring a copy of your health care power of attorney and living will to the office at your convenience.  Conditions/risks identified: none  Next appointment: Follow up on 11/02/2016 at 1:30pm with Dr.Johnson. Follow up in one year for your annual wellness exam   Preventive Care 65 Years and Older, Female Preventive care refers to lifestyle choices and visits with your health care provider that can promote health and wellness. What does preventive care include?  A yearly physical exam. This is also called an annual well check.  Dental exams once or twice a year.  Routine eye exams. Ask your health care provider how often you should have your eyes checked.  Personal lifestyle choices, including:  Daily care of your teeth and gums.  Regular physical activity.  Eating a healthy diet.  Avoiding tobacco and drug use.  Limiting alcohol use.  Practicing safe sex.  Taking low-dose aspirin every day.  Taking vitamin and mineral supplements as recommended by your health care provider. What happens during an annual well check? The services and screenings done by your health care provider during your annual well check will depend on your age, overall health,  lifestyle risk factors, and family history of disease. Counseling  Your health care provider may ask you questions about your:  Alcohol use.  Tobacco use.  Drug use.  Emotional well-being.  Home and relationship well-being.  Sexual activity.  Eating habits.  History of falls.  Memory and ability to understand (cognition).  Work and work Statistician.  Reproductive health. Screening  You may have the following tests or measurements:  Height, weight, and BMI.  Blood pressure.  Lipid and cholesterol levels. These may be checked every 5 years, or more frequently if you are over 60 years old.  Skin check.  Lung cancer screening. You may have this screening every year starting at age 5 if you have a 30-pack-year history of smoking and currently smoke or have quit within the past 15 years.  Fecal occult blood test (FOBT) of the stool. You may have this test every year starting at age 2.  Flexible sigmoidoscopy or colonoscopy. You may have a sigmoidoscopy every 5 years or a colonoscopy every 10 years starting at age 30.  Hepatitis C blood test.  Hepatitis B blood test.  Sexually transmitted disease (STD) testing.  Diabetes screening. This is done by checking your blood sugar (glucose) after you have not eaten for a while (fasting). You may have this done every 1-3 years.  Bone density scan. This is done to screen for osteoporosis. You may have this done starting at age 74.  Mammogram. This may be done every 1-2 years. Talk to your health care provider about how often you should have regular mammograms. Talk with your health care provider about your test results, treatment options, and if necessary, the need for  more tests. Vaccines  Your health care provider may recommend certain vaccines, such as:  Influenza vaccine. This is recommended every year.  Tetanus, diphtheria, and acellular pertussis (Tdap, Td) vaccine. You may need a Td booster every 10 years.  Zoster  vaccine. You may need this after age 62.  Pneumococcal 13-valent conjugate (PCV13) vaccine. One dose is recommended after age 79.  Pneumococcal polysaccharide (PPSV23) vaccine. One dose is recommended after age 70. Talk to your health care provider about which screenings and vaccines you need and how often you need them. This information is not intended to replace advice given to you by your health care provider. Make sure you discuss any questions you have with your health care provider. Document Released: 03/26/2015 Document Revised: 11/17/2015 Document Reviewed: 12/29/2014 Elsevier Interactive Patient Education  2017 [employer] Prevention in the Home Falls can cause injuries. They can happen to people of all ages. There are many things you can do to make your home safe and to help prevent falls. What can I do on the outside of my home?  Regularly fix the edges of walkways and driveways and fix any cracks.  Remove anything that might make you trip as you walk through a door, such as a raised step or threshold.  Trim any bushes or trees on the path to your home.  Use bright outdoor lighting.  Clear any walking paths of anything that might make someone trip, such as rocks or tools.  Regularly check to see if handrails are loose or broken. Make sure that both sides of any steps have handrails.  Any raised decks and porches should have guardrails on the edges.  Have any leaves, snow, or ice cleared regularly.  Use sand or salt on walking paths during winter.  Clean up any spills in your garage right away. This includes oil or grease spills. What can I do in the bathroom?  Use night lights.  Install grab bars by the toilet and in the tub and shower. Do not use towel bars as grab bars.  Use non-skid mats or decals in the tub or shower.  If you need to sit down in the shower, use a plastic, non-slip stool.  Keep the floor dry. Clean up any water that spills on the  floor as soon as it happens.  Remove soap buildup in the tub or shower regularly.  Attach bath mats securely with double-sided non-slip rug tape.  Do not have throw rugs and other things on the floor that can make you trip. What can I do in the bedroom?  Use night lights.  Make sure that you have a light by your bed that is easy to reach.  Do not use any sheets or blankets that are too big for your bed. They should not hang down onto the floor.  Have a firm chair that has side arms. You can use this for support while you get dressed.  Do not have throw rugs and other things on the floor that can make you trip. What can I do in the kitchen?  Clean up any spills right away.  Avoid walking on wet floors.  Keep items that you use a lot in easy-to-reach places.  If you need to reach something above you, use a strong step stool that has a grab bar.  Keep electrical cords out of the way.  Do not use floor polish or wax that makes floors slippery. If you must use wax, use non-skid  floor wax.  Do not have throw rugs and other things on the floor that can make you trip. What can I do with my stairs?  Do not leave any items on the stairs.  Make sure that there are handrails on both sides of the stairs and use them. Fix handrails that are broken or loose. Make sure that handrails are as long as the stairways.  Check any carpeting to make sure that it is firmly attached to the stairs. Fix any carpet that is loose or worn.  Avoid having throw rugs at the top or bottom of the stairs. If you do have throw rugs, attach them to the floor with carpet tape.  Make sure that you have a light switch at the top of the stairs and the bottom of the stairs. If you do not have them, ask someone to add them for you. What else can I do to help prevent falls?  Wear shoes that:  Do not have high heels.  Have rubber bottoms.  Are comfortable and fit you well.  Are closed at the toe. Do not wear  sandals.  If you use a stepladder:  Make sure that it is fully opened. Do not climb a closed stepladder.  Make sure that both sides of the stepladder are locked into place.  Ask someone to hold it for you, if possible.  Clearly mark and make sure that you can see:  Any grab bars or handrails.  First and last steps.  Where the edge of each step is.  Use tools that help you move around (mobility aids) if they are needed. These include:  Canes.  Walkers.  Scooters.  Crutches.  Turn on the lights when you go into a dark area. Replace any light bulbs as soon as they burn out.  Set up your furniture so you have a clear path. Avoid moving your furniture around.  If any of your floors are uneven, fix them.  If there are any pets around you, be aware of where they are.  Review your medicines with your doctor. Some medicines can make you feel dizzy. This can increase your chance of falling. Ask your doctor what other things that you can do to help prevent falls. This information is not intended to replace advice given to you by your health care provider. Make sure you discuss any questions you have with your health care provider. Document Released: 12/24/2008 Document Revised: 08/05/2015 Document Reviewed: 04/03/2014 Elsevier Interactive Patient Education  2017 [employer].

## 2016-10-25 NOTE — Progress Notes (Signed)
Subjective:   Tammy [name] is a 71 y.o. female who presents for Medicare Annual (Subsequent) preventive examination.  Review of Systems:   Cardiac Risk Factors include: advanced age (>13mn, >>63women);dyslipidemia;diabetes mellitus;hypertension;obesity (BMI >30kg/m2)     Objective:     Vitals: BP 126/75 (BP Location: Right Arm, Patient Position: Sitting)   Pulse 67   Temp (!) 97.5 F (36.4 C)   Resp 16   Ht 5' 2"  (1.575 m)   Wt 221 lb 6.4 oz (100.4 kg)   BMI 40.49 kg/m   Body mass index is 40.49 kg/m.   Tobacco History  Smoking Status  . Former Smoker  . Packs/day: 0.50  . Years: 15.00  . Types: Cigarettes  . Quit date: 03/13/2009  Smokeless Tobacco  . Never Used     Counseling given: Not Answered   Past Medical History:  Diagnosis Date  . Asthma   . Benign neoplasm of colon   . Cervical disc disease    "bulging" - no limitations per pt  . Chronic diastolic CHF (congestive heart failure) ([place]    a. 07/2012 Echo: Nl EF. mild inferoseptal HK, Gr 2 DD, mild conc LVH, mild PR/TR, mild PAH.  . CKD (chronic kidney disease), stage III   . COPD ([place] obstructive pulmonary disease) [place]   . Coronary artery disease    a. 11/2011 Cath: LAD 539m, LCX min irregs, RCA 70p, 10053mth L->R collats, EF 60%-->Med Rx.  . Diabetes mellitus without complication ([place] . Fusion of lumbar spine 12/22/2015  . GERD (gastroesophageal reflux disease)   . History of DVT (deep vein thrombosis)   . History of tobacco abuse    a. Quit 2011.  . HMarland Kitchenperlipidemia   . Hypertension   . Hypothyroidism   . Obesity   . Palpitations   . PE (pulmonary embolism) 10/15  . PONV (postoperative nausea and vomiting)   . PVD (peripheral vascular disease) ([place]  a. PTA of right leg and left femoral artery with stenosis.  . Stroke ([facility]  TIAs - no deficits   Past Surgical History:  Procedure Laterality Date  . ANGIOPLASTY / STENTING FEMORAL     PVD; angioplasty right leg and left femoral  artery with stenosis.   . APPENDECTOMY  Oct. 2016  . BACK SURGERY  03/2012  . BACK SURGERY     screws, rods replaced with new hardware.  . CMarland KitchenRDIAC CATHETERIZATION  12/07/2011   Mid LAD 50%, distal LAD 50%, mid RCA 70%, distal RCA 100%   . CARDIAC CATHETERIZATION  01/04/2011   100% occluded mid RCA with good collaterals from distal LAD, normal LVEF.  . CMarland KitchenOLECYSTECTOMY    . COLONOSCOPY  2013  . COLONOSCOPY WITH PROPOFOL N/A 05/24/2015   Procedure: COLONOSCOPY WITH PROPOFOL;  Surgeon: DarLucilla LameD;  [facility];  Laterality: N/A;  Diabetic - oral meds  . LAPAROSCOPIC APPENDECTOMY N/A 01/06/2015   Procedure: APPENDECTOMY LAPAROSCOPIC;  Surgeon: SeeChristene LyeD;  Location: [facility];  Service: General;  Laterality: N/A;  . POLYPECTOMY  05/24/2015   Procedure: POLYPECTOMY;  Surgeon: DarLucilla LameD;  [place] [facility];;  . THYROIDECTOMY    . TRIGGER FINGER RELEASE     Family History  Problem Relation Age of Onset  . Hypertension Father   . Heart disease Father   . Heart attack Father   . Diabetes Father   . Cancer Mother  colon  . Heart attack Brother   . Diabetes Brother   . Hypertension Brother   . Heart disease Brother   . Heart attack Brother   . Diabetes Brother   . Hypertension Brother   . Heart disease Brother   . Heart attack Brother   . Diabetes Brother   . Hypertension Brother   . Heart disease Brother   . Cancer Sister        lung  . COPD Sister   . COPD Sister    History  Sexual Activity  . Sexual activity: Not on file    Outpatient Encounter Prescriptions as of 10/25/2016  Medication Sig  . albuterol (PROAIR HFA) 108 (90 Base) MCG/ACT inhaler INHALE 2 PUFFS EVERY 4 HOURS AS NEEDED FOR WHEEZING OR SHORTNESS OF BREATH  . blood glucose meter kit and supplies KIT Dispense based on patient and insurance preference. Use one time daily as directed, E11.9  . ELIQUIS 5 MG TABS  tablet TAKE ONE TABLET BY MOUTH TWICE DAILY  . gabapentin (NEURONTIN) 300 MG capsule TAKE 1 CAPSULE 3 TIMES DAILY  . glipiZIDE (GLUCOTROL) 5 MG tablet TAKE ONE TABLET EVERY DAY BEFORE BREAKFAST  . hydrOXYzine (ATARAX/VISTARIL) 10 MG tablet TAKE ONE TABLET BY MOUTH 3 TIMES DAILY AS NEEDED FOR ITCHING  . isosorbide mononitrate (IMDUR) 60 MG 24 hr tablet TAKE 2 TABLETS EVERY DAY  . levothyroxine (SYNTHROID) 150 MCG tablet Take 1 tablet (150 mcg total) by mouth daily before breakfast. Synthroid only please.  . losartan (COZAAR) 50 MG tablet Take 1 tablet (50 mg total) by mouth daily.  . metoprolol succinate (TOPROL-XL) 50 MG 24 hr tablet Take 1 tablet (50 mg total) by mouth daily. Take with or immediately following a meal.  . mirabegron ER (MYRBETRIQ) 25 MG TB24 tablet Take 25 mg by mouth daily.  . pantoprazole (PROTONIX) 40 MG tablet Take 1 tablet (40 mg total) by mouth daily as needed. Caution:prolonged use may increase risk of pneumonia, colitis, osteoporosis, anemia  . rosuvastatin (CRESTOR) 40 MG tablet Take 1 tablet (40 mg total) by mouth daily.  [name] trimethoprim (TRIMPEX) 100 MG tablet Take 100 mg by mouth daily.  . nitroGLYCERIN (NITROSTAT) 0.4 MG SL tablet Place 1 tablet (0.4 mg total) under the tongue every 5 (five) minutes as needed for chest pain. (Patient not taking: Reported on 10/25/2016)  . [DISCONTINUED] losartan (COZAAR) 50 MG tablet Take 1 tablet (50 mg total) by mouth daily.   No facility-administered encounter medications on file as of 10/25/2016.     Activities of Daily Living In your present state of health, do you have any difficulty performing the following activities: 10/25/2016 12/10/2015  Hearing? N N  Vision? N Y  Difficulty concentrating or making decisions? N N  Walking or climbing stairs? Y N  Dressing or bathing? N N  Doing errands, shopping? Y -  Comment husband assists  -  Conservation officer, nature and eating ? N -  Using the Toilet? N -  In the past six months, have you  accidently leaked urine? Y -  Comment wears pads -  Do you have problems with loss of bowel control? N -  Managing your Medications? N -  Managing your Finances? N -  Housekeeping or managing your Housekeeping? N -  Some recent data might be hidden    Patient Care Team: [name], DO as PCP - General (Family Medicine) [name], [name], MD as Consulting Physician (Cardiology) [name], MD as Consulting  Physician (Urology) [name], MD as Consulting Physician (Nephrology) [name], MD as Referring Physician (Orthopedic Surgery) [name], MD as Attending Physician (Endocrinology) [name], MD as Referring Physician (Vascular Surgery)    Assessment:     Exercise Activities and Dietary recommendations Current Exercise Habits: Home exercise routine, Type of exercise: stretching, Time (Minutes): 10, Frequency (Times/Week): 5, Weekly Exercise (Minutes/Week): 50, Intensity: Mild, Exercise limited by: orthopedic condition(s)  Goals    None     Fall Risk Fall Risk  10/25/2016 12/10/2015 10/01/2015 06/24/2015 05/18/2015  Falls in the past year? Yes Yes No No No  Number falls in past yr: 2 or more 2 or more - - -  Injury with Fall? No No - - -  Follow up Falls prevention discussed - - - -   Depression Screen PHQ 2/9 Scores 10/25/2016 12/10/2015 10/01/2015 06/24/2015  PHQ - 2 Score 0 0 0 0     Cognitive Function     6CIT Screen 10/25/2016  What Year? 0 points  What month? 0 points  What time? 0 points  Count back from 20 0 points  Months in reverse 0 points  Repeat phrase 0 points  Total Score 0    Immunization History  Administered Date(s) Administered  . Influenza,inj,Quad PF,36+ Mos 12/16/2014, 11/24/2015  . Pneumococcal Conjugate-13 07/08/2014  . Pneumococcal Polysaccharide-23 03/13/2012  . Zoster 03/13/2010   Screening Tests Health Maintenance  Topic Date Due  . INFLUENZA VACCINE  10/11/2016  . FOOT EXAM  11/02/2016 (Originally  05/25/2016)  . MAMMOGRAM  02/10/2017 (Originally 08/18/1993)  . TETANUS/TDAP  02/10/2017 (Originally 08/19/1962)  . HEMOGLOBIN A1C  01/14/2017  . OPHTHALMOLOGY EXAM  03/27/2017  . COLONOSCOPY  05/23/2020  . DEXA SCAN  Addressed  . Hepatitis C Screening  Completed  . PNA vac Low Risk Adult  Completed      Plan:    I have personally reviewed and addressed the Medicare Annual Wellness questionnaire and have noted the following in the patient's chart:  A. Medical and social history B. Use of alcohol, tobacco or illicit drugs  C. Current medications and supplements D. Functional ability and status E.  Nutritional status F.  Physical activity G. Advance directives H. List of other physicians I.  Hospitalizations, surgeries, and ER visits in previous 12 months J.  [place] such as hearing and vision if needed, cognitive and depression L. Referrals and appointments   In addition, I have reviewed and discussed with patient certain preventive protocols, quality metrics, and best practice recommendations. A written personalized care plan for preventive services as well as general preventive health recommendations were provided to patient.   Signed,  Tyler Aas, LPN Nurse Health Advisor   MD Recommendations Due for diabetic foot exam.

## 2016-10-26 ENCOUNTER — Telehealth: Payer: Self-pay | Admitting: Cardiovascular Disease

## 2016-10-26 ENCOUNTER — Telehealth: Payer: Self-pay | Admitting: Family Medicine

## 2016-10-26 LAB — CBC WITH DIFFERENTIAL/PLATELET
Basophils Absolute: 0.1 10*3/uL (ref 0.0–0.2)
Basos: 1 %
EOS (ABSOLUTE): 0.2 10*3/uL (ref 0.0–0.4)
Eos: 3 %
Hematocrit: 40.7 % (ref 34.0–46.6)
Hemoglobin: 13.5 g/dL (ref 11.1–15.9)
Immature Grans (Abs): 0.1 10*3/uL (ref 0.0–0.1)
Immature Granulocytes: 1 %
Lymphocytes Absolute: 2.8 10*3/uL (ref 0.7–3.1)
Lymphs: 34 %
MCH: 29.3 pg (ref 26.6–33.0)
MCHC: 33.2 g/dL (ref 31.5–35.7)
MCV: 88 fL (ref 79–97)
Monocytes Absolute: 0.9 10*3/uL (ref 0.1–0.9)
Monocytes: 11 %
Neutrophils Absolute: 4.3 10*3/uL (ref 1.4–7.0)
Neutrophils: 50 %
Platelets: 292 10*3/uL (ref 150–379)
RBC: 4.61 x10E6/uL (ref 3.77–5.28)
RDW: 13.7 % (ref 12.3–15.4)
WBC: 8.4 10*3/uL (ref 3.4–10.8)

## 2016-10-26 LAB — BASIC METABOLIC PANEL
BUN/Creatinine Ratio: 20 (ref 12–28)
BUN: 34 mg/dL — ABNORMAL HIGH (ref 8–27)
CO2: 21 mmol/L (ref 20–29)
Calcium: 9.1 mg/dL (ref 8.7–10.3)
Chloride: 101 mmol/L (ref 96–106)
Creatinine, Ser: 1.68 mg/dL — ABNORMAL HIGH (ref 0.57–1.00)
GFR calc Af Amer: 34 mL/min/{1.73_m2} — ABNORMAL LOW (ref >59)
GFR calc non Af Amer: 30 mL/min/{1.73_m2} — ABNORMAL LOW (ref >59)
Glucose: 157 mg/dL — ABNORMAL HIGH (ref 65–99)
Potassium: 5.3 mmol/L — ABNORMAL HIGH (ref 3.5–5.2)
Sodium: 141 mmol/L (ref 134–144)

## 2016-10-26 NOTE — Telephone Encounter (Signed)
Acceptable risk to hold the anticoagulation Would make sure she is wearing her knee-high compression hose when she is not on anticoagulation

## 2016-10-26 NOTE — Telephone Encounter (Signed)
Please let Tammy [name] know that her labs look good and her kidneys are a little better than last time. Can we make sure she follows up with Dr. Rolly Salter? Thanks!

## 2016-10-26 NOTE — Telephone Encounter (Signed)
Called and let patient know what Dr. Wynetta Emery said about her labs. I asked if she had an appointment with Dr. Juleen China coming up and she stated that she did not but she will get one. She states that she is dealing with her back right now and has appointment for those. She states that she is having a stimulator put in next week.

## 2016-10-26 NOTE — Telephone Encounter (Signed)
Spoke with patient and made her aware of Dr. Donivan Scull recommendations and also routed note over to the number provided. She verbalized understanding with no further questions or concerns at this time.

## 2016-10-26 NOTE — Telephone Encounter (Signed)
Received Blood Thinner Notice from [facility] and marked "Urgent".   [facility] would like to inform you that Tammy [name] is scheduled for a SPinal Cord Stimulator Trial on 11/01/2016. [facility] requests the prescribing provider evaluate holding the blood thinner medication Eliquis for five (5) days prior to their procedure and through the seven (7) day trial.  The letter then requests if patient may hold this medication as described above and if not to make them aware. Fax number [phone]  Will route to Dr. [name] for review and recommendations.

## 2016-10-27 NOTE — Telephone Encounter (Signed)
Spoke with Fraser Din over at [facility]. She requested that we please fax the clearance again to her attention. Will route message over to them again with information.

## 2016-11-01 DIAGNOSIS — M5416 Radiculopathy, lumbar region: Secondary | ICD-10-CM | POA: Diagnosis not present

## 2016-11-02 ENCOUNTER — Ambulatory Visit: Payer: Medicare Other | Admitting: Family Medicine

## 2016-11-08 ENCOUNTER — Other Ambulatory Visit: Payer: Self-pay | Admitting: Cardiovascular Disease

## 2016-11-08 ENCOUNTER — Other Ambulatory Visit: Payer: Self-pay | Admitting: Family Medicine

## 2016-11-11 HISTORY — PX: BACK SURGERY: SHX140

## 2016-11-16 ENCOUNTER — Ambulatory Visit (INDEPENDENT_AMBULATORY_CARE_PROVIDER_SITE_OTHER): Payer: Medicare Other | Admitting: Family Medicine

## 2016-11-16 ENCOUNTER — Encounter: Payer: Self-pay | Admitting: Family Medicine

## 2016-11-16 VITALS — BP 138/77 | HR 65 | Temp 97.6°F | Wt 219.4 lb

## 2016-11-16 DIAGNOSIS — Z23 Encounter for immunization: Secondary | ICD-10-CM | POA: Diagnosis not present

## 2016-11-16 DIAGNOSIS — N179 Acute kidney failure, unspecified: Secondary | ICD-10-CM

## 2016-11-16 DIAGNOSIS — I209 Angina pectoris, unspecified: Secondary | ICD-10-CM | POA: Diagnosis not present

## 2016-11-16 DIAGNOSIS — N189 Chronic kidney disease, unspecified: Secondary | ICD-10-CM

## 2016-11-16 DIAGNOSIS — E118 Type 2 diabetes mellitus with unspecified complications: Secondary | ICD-10-CM | POA: Diagnosis not present

## 2016-11-16 DIAGNOSIS — IMO0002 Reserved for concepts with insufficient information to code with codable children: Secondary | ICD-10-CM

## 2016-11-16 DIAGNOSIS — E1165 Type 2 diabetes mellitus with hyperglycemia: Secondary | ICD-10-CM | POA: Diagnosis not present

## 2016-11-16 NOTE — Patient Instructions (Addendum)

## 2016-11-16 NOTE — Assessment & Plan Note (Signed)
Rechecking levels today. Await results. Call with any problems.

## 2016-11-16 NOTE — Progress Notes (Signed)
BP 138/77 (BP Location: Right Arm, Patient Position: Sitting, Cuff Size: Normal)   Pulse 65   Temp 97.6 F (36.4 C)   Wt 219 lb 6 oz (99.5 kg)   SpO2 97%   BMI 40.12 kg/m    Subjective:    Patient ID: Tammy [name], female    DOB: Jan 02, 1946, 71 y.o.   MRN: [id number]  HPI: Tammy [name] is a 71 y.o. female  Chief Complaint  Patient presents with  . Diabetes   DIABETES Hypoglycemic episodes:no Polydipsia/polyuria: no Visual disturbance: no Chest pain: no Paresthesias: no Glucose Monitoring: yes  Accucheck frequency: BID  Fasting glucose: 125 Taking Insulin?: no Blood Pressure Monitoring: not checking Retinal Examination: Up to Date Foot Exam: Up to Date Diabetic Education: Completed Pneumovax: Up to Date Influenza: Up to Date Aspirin: no   Relevant past medical, surgical, family and social history reviewed and updated as indicated. Interim medical history since our last visit reviewed. Allergies and medications reviewed and updated.  Review of Systems  Constitutional: Negative.   Respiratory: Negative.   Cardiovascular: Negative.   Psychiatric/Behavioral: Negative.     Per HPI unless specifically indicated above     Objective:    BP 138/77 (BP Location: Right Arm, Patient Position: Sitting, Cuff Size: Normal)   Pulse 65   Temp 97.6 F (36.4 C)   Wt 219 lb 6 oz (99.5 kg)   SpO2 97%   BMI 40.12 kg/m   Wt Readings from Last 3 Encounters:  11/16/16 219 lb 6 oz (99.5 kg)  10/25/16 221 lb 6.4 oz (100.4 kg)  08/29/16 222 lb 4 oz (100.8 kg)    Physical Exam  Constitutional: She is oriented to person, place, and time. She appears well-developed and well-nourished. No distress.  HENT:  Head: Normocephalic and atraumatic.  Right Ear: Hearing normal.  Left Ear: Hearing normal.  Nose: Nose normal.  Eyes: Conjunctivae and lids are normal. Right eye exhibits no discharge. Left eye exhibits no discharge. No scleral icterus.  Cardiovascular: Normal rate, regular  rhythm, normal heart sounds and intact distal pulses.  Exam reveals no gallop and no friction rub.   No murmur heard. Pulmonary/Chest: Effort normal and breath sounds normal. No respiratory distress. She has no wheezes. She has no rales. She exhibits no tenderness.  Musculoskeletal: Normal range of motion.  Neurological: She is alert and oriented to person, place, and time.  Skin: Skin is warm, dry and intact. No rash noted. She is not diaphoretic. No erythema. No pallor.  Psychiatric: She has a normal mood and affect. Her speech is normal and behavior is normal. Judgment and thought content normal. Cognition and memory are normal.  Nursing note and vitals reviewed.   Results for orders placed or performed in visit on 33/29/51  Basic metabolic panel  Result Value Ref Range   Glucose 157 (H) 65 - 99 mg/dL   BUN 34 (H) 8 - 27 mg/dL   Creatinine, Ser 1.68 (H) 0.57 - 1.00 mg/dL   GFR calc non Af Amer 30 (L) >59 mL/min/1.73   GFR calc Af Amer 34 (L) >59 mL/min/1.73   BUN/Creatinine Ratio 20 12 - 28   Sodium 141 134 - 144 mmol/L   Potassium 5.3 (H) 3.5 - 5.2 mmol/L   Chloride 101 96 - 106 mmol/L   CO2 21 20 - 29 mmol/L   Calcium 9.1 8.7 - 10.3 mg/dL  CBC with Differential/Platelet  Result Value Ref Range   WBC 8.4 3.4 - 10.8  x10E3/uL   RBC 4.61 3.77 - 5.28 x10E6/uL   Hemoglobin 13.5 11.1 - 15.9 g/dL   Hematocrit 40.7 34.0 - 46.6 %   MCV 88 79 - 97 fL   MCH 29.3 26.6 - 33.0 pg   MCHC 33.2 31.5 - 35.7 g/dL   RDW 13.7 12.3 - 15.4 %   Platelets 292 150 - 379 x10E3/uL   Neutrophils 50 Not Estab. %   Lymphs 34 Not Estab. %   Monocytes 11 Not Estab. %   Eos 3 Not Estab. %   Basos 1 Not Estab. %   Neutrophils Absolute 4.3 1.4 - 7.0 x10E3/uL   Lymphocytes Absolute 2.8 0.7 - 3.1 x10E3/uL   Monocytes Absolute 0.9 0.1 - 0.9 x10E3/uL   EOS (ABSOLUTE) 0.2 0.0 - 0.4 x10E3/uL   Basophils Absolute 0.1 0.0 - 0.2 x10E3/uL   Immature Granulocytes 1 Not Estab. %   Immature Grans (Abs) 0.1 0.0 -  0.1 x10E3/uL      Assessment & Plan:   Problem List Items Addressed This Visit      Endocrine   Diabetes mellitus type 2 with complications, uncontrolled ([place])    Rechecking levels today. Await results. Call with any problems.       Relevant Orders   Hgb A1c w/o eAG     Genitourinary   Renal failure (ARF), acute on chronic ([facility])    Rechecking levels today. Await results. Call with any problems.       Relevant Orders   Comprehensive metabolic panel   Hgb E1D w/o eAG    Other Visit Diagnoses    Immunization due    -  Primary   Relevant Orders   Flu vaccine HIGH DOSE PF (Fluzone High dose) (Completed)       Follow up plan: Return in about 3 months (around 02/15/2017) for DM/Chol/BP follow up.

## 2016-11-21 DIAGNOSIS — M961 Postlaminectomy syndrome, not elsewhere classified: Secondary | ICD-10-CM | POA: Diagnosis not present

## 2016-11-23 ENCOUNTER — Telehealth: Payer: Self-pay | Admitting: Cardiovascular Disease

## 2016-11-23 NOTE — Telephone Encounter (Signed)
Clearance forms received from [facility]. Reviewed forms with Dr. [name] and he completed them. Will fax back to number indicated and place in "Faxed" bin on Lateia Fraser's desk.

## 2016-12-01 DIAGNOSIS — N189 Chronic kidney disease, unspecified: Secondary | ICD-10-CM | POA: Diagnosis not present

## 2016-12-01 DIAGNOSIS — E1122 Type 2 diabetes mellitus with diabetic chronic kidney disease: Secondary | ICD-10-CM | POA: Diagnosis not present

## 2016-12-01 DIAGNOSIS — M961 Postlaminectomy syndrome, not elsewhere classified: Secondary | ICD-10-CM | POA: Diagnosis not present

## 2016-12-01 DIAGNOSIS — E0789 Other specified disorders of thyroid: Secondary | ICD-10-CM | POA: Diagnosis not present

## 2016-12-01 DIAGNOSIS — I129 Hypertensive chronic kidney disease with stage 1 through stage 4 chronic kidney disease, or unspecified chronic kidney disease: Secondary | ICD-10-CM | POA: Diagnosis not present

## 2016-12-13 ENCOUNTER — Other Ambulatory Visit: Payer: Self-pay | Admitting: Family Medicine

## 2016-12-14 DIAGNOSIS — M961 Postlaminectomy syndrome, not elsewhere classified: Secondary | ICD-10-CM | POA: Diagnosis not present

## 2016-12-29 ENCOUNTER — Encounter: Payer: Self-pay | Admitting: Family Medicine

## 2016-12-29 ENCOUNTER — Ambulatory Visit (INDEPENDENT_AMBULATORY_CARE_PROVIDER_SITE_OTHER): Payer: Medicare Other | Admitting: Family Medicine

## 2016-12-29 VITALS — BP 112/72 | HR 67 | Temp 97.4°F | Wt 221.0 lb

## 2016-12-29 DIAGNOSIS — E1165 Type 2 diabetes mellitus with hyperglycemia: Secondary | ICD-10-CM | POA: Diagnosis not present

## 2016-12-29 DIAGNOSIS — IMO0002 Reserved for concepts with insufficient information to code with codable children: Secondary | ICD-10-CM

## 2016-12-29 DIAGNOSIS — N3281 Overactive bladder: Secondary | ICD-10-CM

## 2016-12-29 DIAGNOSIS — E118 Type 2 diabetes mellitus with unspecified complications: Secondary | ICD-10-CM | POA: Diagnosis not present

## 2016-12-29 DIAGNOSIS — I209 Angina pectoris, unspecified: Secondary | ICD-10-CM

## 2016-12-29 MED ORDER — OXYBUTYNIN CHLORIDE 5 MG PO TABS
5.0000 mg | ORAL_TABLET | Freq: Three times a day (TID) | ORAL | 2 refills | Status: DC
Start: 2016-12-29 — End: 2017-03-30

## 2016-12-29 NOTE — Progress Notes (Signed)
BP 112/72   Pulse 67   Temp (!) 97.4 F (36.3 C)   Wt 221 lb (100.2 kg)   SpO2 96%   BMI 40.42 kg/m    Subjective:    Patient ID: Tammy [name], female    DOB: 1945-07-05, 71 y.o.   MRN: [id number]  HPI: Tammy [name] is a 71 y.o. female  Chief Complaint  Patient presents with  . Diabetes    Since she had back surgery in Sept. her sugars have been in the 200-300s.  . Medication Problem    Mybetriq is too expensive, wants to know if there is something cheaper.   Patient presents to discuss uncontrolled BS's since back surgery over a month ago. Prior to surgery, they were running in the 130s but now running about 200-330. Still taking glipizide faithfully. Feels that diet has been stable. No low blood sugar spells.   Also wanting to discuss a more affordable bladder medication. Can't afford to myrbetriq but feels she needs something for her persistent urinary sxs.   Relevant past medical, surgical, family and social history reviewed and updated as indicated. Interim medical history since our last visit reviewed. Allergies and medications reviewed and updated.  Review of Systems  Constitutional: Negative.   HENT: Negative.   Respiratory: Negative.   Cardiovascular: Negative.   Gastrointestinal: Negative.   Genitourinary: Positive for urgency.  Musculoskeletal: Negative.   Neurological: Negative.   Psychiatric/Behavioral: Negative.    Per HPI unless specifically indicated above     Objective:    BP 112/72   Pulse 67   Temp (!) 97.4 F (36.3 C)   Wt 221 lb (100.2 kg)   SpO2 96%   BMI 40.42 kg/m   Wt Readings from Last 3 Encounters:  12/29/16 221 lb (100.2 kg)  11/16/16 219 lb 6 oz (99.5 kg)  10/25/16 221 lb 6.4 oz (100.4 kg)    Physical Exam  Constitutional: She appears well-developed and well-nourished. No distress.  HENT:  Head: Atraumatic.  Eyes: Pupils are equal, round, and reactive to light. Conjunctivae are normal.  Neck: Normal range of motion. Neck  supple.  Cardiovascular: Normal rate and normal heart sounds.   Pulmonary/Chest: Effort normal. No respiratory distress.  Musculoskeletal: Normal range of motion. She exhibits no edema.  Neurological: She is alert.  Skin: Skin is warm and dry.  Psychiatric: She has a normal mood and affect. Her behavior is normal.  Nursing note and vitals reviewed.  Results for orders placed or performed in visit on 12/29/16  HgB A1c  Result Value Ref Range   Hgb A1c MFr Bld 10.6 (H) 4.8 - 5.6 %   Est. average glucose Bld gHb Est-mCnc 258 mg/dL  Comprehensive metabolic panel  Result Value Ref Range   Glucose 268 (H) 65 - 99 mg/dL   BUN 26 8 - 27 mg/dL   Creatinine, Ser 1.80 (H) 0.57 - 1.00 mg/dL   GFR calc non Af Amer 28 (L) >59 mL/min/1.73   GFR calc Af Amer 32 (L) >59 mL/min/1.73   BUN/Creatinine Ratio 14 12 - 28   Sodium 139 134 - 144 mmol/L   Potassium 5.9 (H) 3.5 - 5.2 mmol/L   Chloride 100 96 - 106 mmol/L   CO2 24 20 - 29 mmol/L   Calcium 9.1 8.7 - 10.3 mg/dL   Total Protein 6.7 6.0 - 8.5 g/dL   Albumin 4.0 3.5 - 4.8 g/dL   Globulin, Total 2.7 1.5 - 4.5 g/dL   Albumin/Globulin  Ratio 1.5 1.2 - 2.2   Bilirubin Total 0.5 0.0 - 1.2 mg/dL   Alkaline Phosphatase 93 39 - 117 IU/L   AST 14 0 - 40 IU/L   ALT 7 0 - 32 IU/L      Assessment & Plan:   Problem List Items Addressed This Visit      Endocrine   Diabetes mellitus type 2 with complications, uncontrolled ([place]) - Primary    Await A1C results. Continue working on dietary control and increasing activity as tolerated. Will adjust as needed, continue glipizide in meantime      Relevant Orders   HgB A1c (Completed)   Comprehensive metabolic panel (Completed)     Genitourinary   Detrusor muscle hypertonia    Will start ditropan if more affordable.           Follow up plan: Return in about 3 months (around 03/31/2017) for A1C.

## 2016-12-30 LAB — COMPREHENSIVE METABOLIC PANEL
ALT: 7 IU/L (ref 0–32)
AST: 14 IU/L (ref 0–40)
Albumin/Globulin Ratio: 1.5 (ref 1.2–2.2)
Albumin: 4 g/dL (ref 3.5–4.8)
Alkaline Phosphatase: 93 IU/L (ref 39–117)
BUN/Creatinine Ratio: 14 (ref 12–28)
BUN: 26 mg/dL (ref 8–27)
Bilirubin Total: 0.5 mg/dL (ref 0.0–1.2)
CO2: 24 mmol/L (ref 20–29)
Calcium: 9.1 mg/dL (ref 8.7–10.3)
Chloride: 100 mmol/L (ref 96–106)
Creatinine, Ser: 1.8 mg/dL — ABNORMAL HIGH (ref 0.57–1.00)
GFR calc Af Amer: 32 mL/min/{1.73_m2} — ABNORMAL LOW (ref >59)
GFR calc non Af Amer: 28 mL/min/{1.73_m2} — ABNORMAL LOW (ref >59)
Globulin, Total: 2.7 g/dL (ref 1.5–4.5)
Glucose: 268 mg/dL — ABNORMAL HIGH (ref 65–99)
Potassium: 5.9 mmol/L — ABNORMAL HIGH (ref 3.5–5.2)
Sodium: 139 mmol/L (ref 134–144)
Total Protein: 6.7 g/dL (ref 6.0–8.5)

## 2016-12-30 LAB — HEMOGLOBIN A1C
Est. average glucose Bld gHb Est-mCnc: 258 mg/dL
Hgb A1c MFr Bld: 10.6 % — ABNORMAL HIGH (ref 4.8–5.6)

## 2016-12-31 NOTE — Assessment & Plan Note (Signed)
Await A1C results. Continue working on dietary control and increasing activity as tolerated. Will adjust as needed, continue glipizide in meantime

## 2016-12-31 NOTE — Patient Instructions (Signed)
Follow up in 3 months for recheck

## 2016-12-31 NOTE — Assessment & Plan Note (Signed)
Will start ditropan if more affordable.

## 2017-01-01 ENCOUNTER — Telehealth: Payer: Self-pay | Admitting: Family Medicine

## 2017-01-01 ENCOUNTER — Other Ambulatory Visit: Payer: Self-pay | Admitting: Cardiovascular Disease

## 2017-01-01 DIAGNOSIS — E875 Hyperkalemia: Secondary | ICD-10-CM

## 2017-01-01 MED ORDER — METFORMIN HCL ER 500 MG PO TB24: 1000.0000 mg | ORAL_TABLET | Freq: Every day | ORAL | 0 refills | Status: DC

## 2017-01-01 NOTE — Telephone Encounter (Signed)
Called pt regarding lab results and rising A1C. Will add metformin 1000 mg to the glipizide and have her back to see Dr. Wynetta Emery in 1 month for recheck.   Also placed an order for BMP as potassium was elevated at 5.9. Counseled on avoiding high potassium foods and drinking plenty of water.

## 2017-01-03 ENCOUNTER — Other Ambulatory Visit: Payer: Medicare Other

## 2017-01-03 DIAGNOSIS — IMO0002 Reserved for concepts with insufficient information to code with codable children: Secondary | ICD-10-CM

## 2017-01-03 DIAGNOSIS — E118 Type 2 diabetes mellitus with unspecified complications: Secondary | ICD-10-CM | POA: Diagnosis not present

## 2017-01-03 DIAGNOSIS — E1165 Type 2 diabetes mellitus with hyperglycemia: Secondary | ICD-10-CM

## 2017-01-03 DIAGNOSIS — N179 Acute kidney failure, unspecified: Secondary | ICD-10-CM | POA: Diagnosis not present

## 2017-01-03 DIAGNOSIS — N189 Chronic kidney disease, unspecified: Secondary | ICD-10-CM | POA: Diagnosis not present

## 2017-01-03 DIAGNOSIS — E875 Hyperkalemia: Secondary | ICD-10-CM

## 2017-01-04 ENCOUNTER — Telehealth: Payer: Self-pay | Admitting: Family Medicine

## 2017-01-04 LAB — COMPREHENSIVE METABOLIC PANEL
ALT: 9 IU/L (ref 0–32)
AST: 11 IU/L (ref 0–40)
Albumin/Globulin Ratio: 1.4 (ref 1.2–2.2)
Albumin: 3.8 g/dL (ref 3.5–4.8)
Alkaline Phosphatase: 91 IU/L (ref 39–117)
BUN/Creatinine Ratio: 14 (ref 12–28)
BUN: 27 mg/dL (ref 8–27)
Bilirubin Total: 0.5 mg/dL (ref 0.0–1.2)
CO2: 22 mmol/L (ref 20–29)
Calcium: 8.9 mg/dL (ref 8.7–10.3)
Chloride: 98 mmol/L (ref 96–106)
Creatinine, Ser: 1.89 mg/dL — ABNORMAL HIGH (ref 0.57–1.00)
GFR calc Af Amer: 30 mL/min/{1.73_m2} — ABNORMAL LOW (ref >59)
GFR calc non Af Amer: 26 mL/min/{1.73_m2} — ABNORMAL LOW (ref >59)
Globulin, Total: 2.8 g/dL (ref 1.5–4.5)
Glucose: 234 mg/dL — ABNORMAL HIGH (ref 65–99)
Potassium: 5.2 mmol/L (ref 3.5–5.2)
Sodium: 135 mmol/L (ref 134–144)
Total Protein: 6.6 g/dL (ref 6.0–8.5)

## 2017-01-04 LAB — HGB A1C W/O EAG: Hgb A1c MFr Bld: 10.7 % — ABNORMAL HIGH (ref 4.8–5.6)

## 2017-01-04 NOTE — Telephone Encounter (Signed)
Patient notified

## 2017-01-04 NOTE — Telephone Encounter (Signed)
I'm not sure why she came back in so quickly to have her blood drawn, but it was pretty much the same as when Apolonio Schneiders drew it. Her potassium is better though. Please let her know. Thanks!

## 2017-01-11 ENCOUNTER — Other Ambulatory Visit: Payer: Self-pay | Admitting: Family Medicine

## 2017-01-30 ENCOUNTER — Other Ambulatory Visit: Payer: Self-pay | Admitting: Family Medicine

## 2017-02-20 ENCOUNTER — Other Ambulatory Visit: Payer: Self-pay | Admitting: Family Medicine

## 2017-02-22 ENCOUNTER — Ambulatory Visit: Payer: Self-pay | Admitting: *Deleted

## 2017-02-22 NOTE — Telephone Encounter (Signed)
Pt     Has   Severe  Back  Pain  With   Weakness  In  Legs   With    Numbness  In   Both  Feet  .  Pt has  Back   Surgery  3  Major  Back  Surgeries   With   Rods  In her  Back .   Pain is   Worse   When  She  Stands .  She  Is  Able   To  Walk  With  The   Use  Of    A  Walker .  Denies   Any      Loss  Of  Urinary   Function .    Pt    Does  Not   Have   Any  Appointment  At  This   Time  With  A   Specialist .Pts husband     States  He  Is  Able  To  Take   His   Wife  To  The  Office.   Reason for Disposition . Numbness in a leg or foot (i.e., loss of sensation)  Answer Assessment - Initial Assessment Questions 1. ONSET: "When did the pain begin?"       3  Years    Getting   Worse    Had   Nerve   Stimulator  Put  In  3  Months    2. LOCATION: "Where does it hurt?" (upper, mid or lower back)        Lower   Both legs     3. SEVERITY: "How bad is the pain?"  (e.g., Scale 1-10; mild, moderate, or severe)   - MILD (1-3): doesn't interfere with normal activities    - MODERATE (4-7): interferes with normal activities or awakens from sleep    - SEVERE (8-10): excruciating pain, unable to do any normal activities       Severe   4. PATTERN: "Is the pain constant?" (e.g., yes, no; constant, intermittent)        Consistant  When  Standing up  And walking    5. RADIATION: "Does the pain shoot into your legs or elsewhere?"     Goes  Down legs    6. CAUSE:  "What do you think is causing the back pain?"        Poss   Nerve damage  According   To  Husband   7. BACK OVERUSE:  "Any recent lifting of heavy objects, strenuous work or exercise?"        No   8. MEDICATIONS: "What have you taken so far for the pain?" (e.g., nothing, acetaminophen, NSAIDS)      Tylenol    9. NEUROLOGIC SYMPTOMS: "Do you have any weakness, numbness, or problems with bowel/bladder control?"       Weakness     Takes  meds  For bladder control    No  Recent   Symptoms   10. OTHER SYMPTOMS: "Do you have any other symptoms?" (e.g.,  fever, abdominal pain, burning with urination, blood in urine)       none 11. PREGNANCY: "Is there any chance you are pregnant?" (e.g., yes, no; LMP)        No  Protocols used: BACK PAIN-A-AH

## 2017-02-23 ENCOUNTER — Ambulatory Visit (INDEPENDENT_AMBULATORY_CARE_PROVIDER_SITE_OTHER): Payer: Medicare Other | Admitting: Family Medicine

## 2017-02-23 ENCOUNTER — Encounter: Payer: Self-pay | Admitting: Family Medicine

## 2017-02-23 VITALS — BP 101/56 | HR 66 | Temp 97.6°F

## 2017-02-23 DIAGNOSIS — I209 Angina pectoris, unspecified: Secondary | ICD-10-CM

## 2017-02-23 DIAGNOSIS — G8929 Other chronic pain: Secondary | ICD-10-CM

## 2017-02-23 DIAGNOSIS — Z23 Encounter for immunization: Secondary | ICD-10-CM

## 2017-02-23 DIAGNOSIS — M5442 Lumbago with sciatica, left side: Secondary | ICD-10-CM

## 2017-02-23 DIAGNOSIS — M5441 Lumbago with sciatica, right side: Secondary | ICD-10-CM | POA: Diagnosis not present

## 2017-02-23 MED ORDER — LIDOCAINE 5 % EX PTCH: 1.0000 | MEDICATED_PATCH | CUTANEOUS | 3 refills | Status: DC

## 2017-02-23 NOTE — Progress Notes (Signed)
BP (!) 101/56 (BP Location: Right Arm, Patient Position: Sitting, Cuff Size: Normal)   Pulse 66   Temp 97.6 F (36.4 C) (Oral)   SpO2 96%    Subjective:    Patient ID: Tammy [name], female    DOB: Sep 09, 1945, 71 y.o.   MRN: [id number]  HPI: LONDIN [name] is a 71 y.o. female  Chief Complaint  Patient presents with  . Back Pain    Back pain and leg pain is worse since device got put in.  . Leg Pain  . Shaking    Will jerk and shake real severe in morning. Some shaking while rooming.  . Fatigue    Patient states she's severely tired, and doesn't want to move her body.   Pt presents with worsening back pain over the past few months along with fatigue and difficulty ambulating. Had spinal cord stimulator inserted in September with no relief, followed by specialist. Wanting referral to new Neurosurgeon. Currently taking about 3 gabapentin at bedtime with some relief. Feels her fatigue is from lack of activity due to pain as well as some related mood issues.   Relevant past medical, surgical, family and social history reviewed and updated as indicated. Interim medical history since our last visit reviewed. Allergies and medications reviewed and updated.  Review of Systems  Constitutional: Positive for fatigue.  HENT: Negative.   Respiratory: Negative.   Cardiovascular: Negative.   Gastrointestinal: Negative.   Genitourinary:       Incontinence, chronic  Musculoskeletal: Positive for back pain and gait problem.  Psychiatric/Behavioral: Positive for dysphoric mood.    Per HPI unless specifically indicated above     Objective:    BP (!) 101/56 (BP Location: Right Arm, Patient Position: Sitting, Cuff Size: Normal)   Pulse 66   Temp 97.6 F (36.4 C) (Oral)   SpO2 96%   Wt Readings from Last 3 Encounters:  12/29/16 221 lb (100.2 kg)  11/16/16 219 lb 6 oz (99.5 kg)  10/25/16 221 lb 6.4 oz (100.4 kg)    Physical Exam  Constitutional: She is oriented to person, place, and time.  She appears well-developed and well-nourished.  HENT:  Head: Atraumatic.  Eyes: Conjunctivae are normal. No scleral icterus.  Neck: Normal range of motion. Neck supple.  Cardiovascular: Normal rate and intact distal pulses.  Pulmonary/Chest: Effort normal. No respiratory distress.  Musculoskeletal: She exhibits tenderness (mid and lower back).  Antalgic movements  Neurological: She is alert and oriented to person, place, and time.  Skin: Skin is warm and dry.  Psychiatric: She has a normal mood and affect. Her behavior is normal.  Appears very frustrated by her situation  Nursing note and vitals reviewed.  Results for orders placed or performed in visit on 01/03/17  Hgb A1c w/o eAG  Result Value Ref Range   Hgb A1c MFr Bld 10.7 (H) 4.8 - 5.6 %  Comprehensive metabolic panel  Result Value Ref Range   Glucose 234 (H) 65 - 99 mg/dL   BUN 27 8 - 27 mg/dL   Creatinine, Ser 1.89 (H) 0.57 - 1.00 mg/dL   GFR calc non Af Amer 26 (L) >59 mL/min/1.73   GFR calc Af Amer 30 (L) >59 mL/min/1.73   BUN/Creatinine Ratio 14 12 - 28   Sodium 135 134 - 144 mmol/L   Potassium 5.2 3.5 - 5.2 mmol/L   Chloride 98 96 - 106 mmol/L   CO2 22 20 - 29 mmol/L   Calcium 8.9 8.7 -  10.3 mg/dL   Total Protein 6.6 6.0 - 8.5 g/dL   Albumin 3.8 3.5 - 4.8 g/dL   Globulin, Total 2.8 1.5 - 4.5 g/dL   Albumin/Globulin Ratio 1.4 1.2 - 2.2   Bilirubin Total 0.5 0.0 - 1.2 mg/dL   Alkaline Phosphatase 91 39 - 117 IU/L   AST 11 0 - 40 IU/L   ALT 9 0 - 32 IU/L      Assessment & Plan:   Problem List Items Addressed This Visit    None    Visit Diagnoses    Chronic bilateral low back pain with bilateral sciatica    -  Primary   Will refer to new Neurosurgeon for further eval given poor response to stimulator. Cont gabapentin, start lidocaine patches. Heat pads, rest, massage etc   Relevant Orders   Ambulatory referral to Neurosurgery   Need for influenza vaccination           Follow up plan: Return for as  scheduled.

## 2017-02-26 NOTE — Patient Instructions (Signed)
Follow up as scheduled.  

## 2017-02-27 ENCOUNTER — Telehealth: Payer: Self-pay | Admitting: Family Medicine

## 2017-02-27 DIAGNOSIS — K229 Disease of esophagus, unspecified: Secondary | ICD-10-CM | POA: Diagnosis not present

## 2017-02-27 DIAGNOSIS — K915 Postcholecystectomy syndrome: Secondary | ICD-10-CM | POA: Diagnosis not present

## 2017-02-27 DIAGNOSIS — E890 Postprocedural hypothyroidism: Secondary | ICD-10-CM | POA: Diagnosis not present

## 2017-02-27 DIAGNOSIS — Z809 Family history of malignant neoplasm, unspecified: Secondary | ICD-10-CM | POA: Diagnosis not present

## 2017-02-27 DIAGNOSIS — E063 Autoimmune thyroiditis: Secondary | ICD-10-CM | POA: Diagnosis not present

## 2017-02-28 NOTE — Telephone Encounter (Signed)
ERROR

## 2017-03-07 ENCOUNTER — Other Ambulatory Visit: Payer: Self-pay | Admitting: Cardiovascular Disease

## 2017-03-08 DIAGNOSIS — E890 Postprocedural hypothyroidism: Secondary | ICD-10-CM | POA: Diagnosis not present

## 2017-03-08 DIAGNOSIS — K219 Gastro-esophageal reflux disease without esophagitis: Secondary | ICD-10-CM | POA: Diagnosis not present

## 2017-03-08 DIAGNOSIS — E063 Autoimmune thyroiditis: Secondary | ICD-10-CM | POA: Diagnosis not present

## 2017-03-08 DIAGNOSIS — Z809 Family history of malignant neoplasm, unspecified: Secondary | ICD-10-CM | POA: Diagnosis not present

## 2017-03-20 ENCOUNTER — Telehealth: Payer: Self-pay | Admitting: Family Medicine

## 2017-03-20 ENCOUNTER — Other Ambulatory Visit: Payer: Self-pay | Admitting: Cardiovascular Disease

## 2017-03-20 NOTE — Telephone Encounter (Signed)
Spoke with patient, she seen you about a month ago for back pain. She states that she has spoken with her surgeon and there is not anything that he can do for her and Dr.jenkins at neurosurgery in [place] respectfully declined seeing her.  She thinks that she may have a pinched nerve in her back, because the pain is only when she stands up. Please follow up with this patient.

## 2017-03-20 NOTE — Telephone Encounter (Signed)
Copied from [place] #[id number]. Topic: Quick Communication - See Telephone Encounter >> Mar 20, 2017  2:55 PM Ether Griffins B wrote: CRM for notification. See Telephone encounter for:  Pt's husband is calling in regarding his wife stating wife is in a lot of pain even with the 5 back surgeries legs hurting really bad. A referral was placed to neuro Dr. Arnoldo Morale and someone has called last week got some information from her and never called back they have tried calling several times since then and are only getting vm and no one is returning there calls. They are needing to know what to do for now? Do they need to come in and see Dr. Wynetta Emery again? Does she need another MRI? Should they try another Neuro doc?  03/20/17.

## 2017-03-20 NOTE — Telephone Encounter (Signed)
Routing to provider  

## 2017-03-20 NOTE — Telephone Encounter (Signed)
Called and left a message for new patient coordinator for Dr.Jenkins to return my call.

## 2017-03-20 NOTE — Telephone Encounter (Signed)
I don't know who called her- I don't see anything documented. I only see that Apolonio Schneiders saw her almost a month ago. Can we please give her the phone number for the neurosurgeon? If that is who they've been calling- can we try to call over there and see what's going on with her referral?

## 2017-03-20 NOTE — Telephone Encounter (Signed)
Needs follow up appointment.  

## 2017-03-26 ENCOUNTER — Encounter: Payer: Self-pay | Admitting: Family Medicine

## 2017-03-26 ENCOUNTER — Ambulatory Visit (INDEPENDENT_AMBULATORY_CARE_PROVIDER_SITE_OTHER): Payer: Medicare Other | Admitting: Family Medicine

## 2017-03-26 VITALS — BP 111/65 | HR 71 | Temp 97.9°F

## 2017-03-26 DIAGNOSIS — E118 Type 2 diabetes mellitus with unspecified complications: Secondary | ICD-10-CM

## 2017-03-26 DIAGNOSIS — N390 Urinary tract infection, site not specified: Secondary | ICD-10-CM | POA: Diagnosis not present

## 2017-03-26 DIAGNOSIS — M48061 Spinal stenosis, lumbar region without neurogenic claudication: Secondary | ICD-10-CM

## 2017-03-26 DIAGNOSIS — R441 Visual hallucinations: Secondary | ICD-10-CM

## 2017-03-26 DIAGNOSIS — E782 Mixed hyperlipidemia: Secondary | ICD-10-CM | POA: Diagnosis not present

## 2017-03-26 DIAGNOSIS — E039 Hypothyroidism, unspecified: Secondary | ICD-10-CM

## 2017-03-26 DIAGNOSIS — Z9889 Other specified postprocedural states: Secondary | ICD-10-CM

## 2017-03-26 DIAGNOSIS — E538 Deficiency of other specified B group vitamins: Secondary | ICD-10-CM | POA: Diagnosis not present

## 2017-03-26 DIAGNOSIS — IMO0002 Reserved for concepts with insufficient information to code with codable children: Secondary | ICD-10-CM

## 2017-03-26 DIAGNOSIS — E1165 Type 2 diabetes mellitus with hyperglycemia: Secondary | ICD-10-CM | POA: Diagnosis not present

## 2017-03-26 LAB — BAYER DCA HB A1C WAIVED: HB A1C (BAYER DCA - WAIVED): 8.8 % — ABNORMAL HIGH (ref <7.0)

## 2017-03-26 MED ORDER — METFORMIN HCL ER 500 MG PO TB24: 1000.0000 mg | ORAL_TABLET | Freq: Two times a day (BID) | ORAL | 1 refills | Status: DC

## 2017-03-26 NOTE — Assessment & Plan Note (Signed)
Checking labs today. Await results. Will treat as needed.

## 2017-03-26 NOTE — Progress Notes (Signed)
BP 111/65 (BP Location: Left Arm, Patient Position: Sitting, Cuff Size: Large)   Pulse 71   Temp 97.9 F (36.6 C)   SpO2 99%    Subjective:    Patient ID: Tammy [name], female    DOB: 1945-06-04, 72 y.o.   MRN: [id number]  HPI: Tammy [name] is a 72 y.o. female  Chief Complaint  Patient presents with  . Back Pain  . Leg Pain  . visual hallucinations  . Fatigue   BACK PAIN- Has had several back surgeries in the past as below:  Triangle Left 12/22/2015  Procedure: ARTHRODESIS, ANTERIOR INTERBODY TECHNIQUE, INCLUDING MINIMAL DISCECTOMY TO PREPARE INTERSPACE (OTHER THAN DECOMPRESSION); EACH ADDITIONAL INTERSPACE (LIST IN ADDITION TO CODE FOR PRIMARY PROCEDURE); Surgeon: Nestor Lewandowsky, MD; Location: [facility]; Service: Orthopedics; Laterality: Left;  . BACK SURGERY 2014  fusion : 2 rods, 4 screws  . CHOLECYSTECTOMY 2001  . FUSION LATERAL INTERBODY DLIF/XLIF Left 12/22/2015  Procedure: LEFT LUMBAR 3-4, LUMBAR 4-5 ANTERIOR FUSION LATERAL APPROACH AND POSTERIOR FUSION WITH EXTENSION OF FUSION LUMBAR 3-4, LUMBAR 4-5, LUMBAR 5-SACRAL 1 ALLOGRAFT POSSIBLE LAMINECTOMY; Surgeon: Nestor Lewandowsky, MD; Location: [facility]; Service: Orthopedics; Laterality: Left;  . FUSION LATERAL INTERBODY DLIF/XLIF Left 05/17/2016  Procedure: LEFT LUMBAR 2-3 ANTERIOR FUSION LATERAL APPROACH AND POSTERIOR SPINAL FUSION POSSIBLE LAMINECTOMY AND ALLOGRAFT; Surgeon: Nestor Lewandowsky, MD; Location: [facility]; Service: Orthopedics; Laterality: Left;  . INSTRUMENTATION POSTERIOR SPINE 3 TO 6 VERTEBRAL SEGMENTS Left 12/22/2015  Procedure: POSTERIOR SEGMENTAL INSTRUMENTATION (EG, PEDICLE FIXATION, DUAL RODS WITH MULTIPLE HOOKS AND SUBLAMINAR WIRES); 3 TO 6 VERTEBRAL SEGMENTS (LIST IN ADDITION TO PRIMARY PROCEDURE); Surgeon: Nestor Lewandowsky, MD; Location: [facility]; Service: Orthopedics; Laterality: Left;  . INTRO CATH TO MAIN PULMONARY ARTERY/RIGHT HEART  some blockages in heart:  . LAMINECTOMY  POSTERIOR LUMBAR FACETECTOMY & FORAMINOTOMY W/DECOMP Right 06/25/2013  Procedure: Right lumbar 4-5 laminectomy ; Surgeon: Nestor Lewandowsky, MD; Location: [facility]; Service: Orthopedics; Laterality: Right;  . POSTERIOR LUMBAR SPINE FUSION ONE LEVEL LATERAL TRANSVERSE TECHNIQUE Left 12/22/2015  Procedure: ARTHRODESIS, POSTERIOR OR POSTEROLATERAL TECHNIQUE, SINGLE LEVEL; LUMBAR (WITH LATERAL TRANSVERSE TECHNIQUE, WHEN PERFORMED); Surgeon: Nestor Lewandowsky, MD; Location: [facility]; Service: Orthopedics; Laterality: Left;    The last one was in the fall of 2018. She had been referred for a spinal cord stimulator trial. She had a [employer] device implanted. Per the notes related to that, it helped with 80-90% reduction in pain. However, after the stimulator went in, it started going downhill. She notes that the battery didn't work, her pain kept getting significantly worse. She got it charged back up, but the stimulator had never been working. She didn't want to take the stimulator out because the surgery was so uncomfortable. Has been following with the orthopedist and the [employer] people in [place] and has seen them 3-4x and been talking with them on the phone.  Was put on pain medicine and was so unresponsive that they had to use narcan on her because she was so uncomfortable. The last time he saw her orthopedist, he said that there was nothing else that they could do for her.  Duration: weeks Location: bilateral and low back Onset: gradual Severity: severe Quality: sharp, dull, aching, shooting and stabbing Frequency: constant Radiation: R leg below the knee Aggravating factors: lifting, movement and walking Alleviating factors: sitting Status: worse Treatments attempted: rest, ice, heat and APAP  Relief with NSAIDs?: No NSAIDs Taken Nighttime pain:  yes Paresthesias / decreased sensation:  yes Bowel / bladder incontinence:  no Fevers:  no Dysuria / urinary frequency:  no  DIABETES Hypoglycemic  episodes:no Polydipsia/polyuria: no Visual disturbance: no Chest pain: no Paresthesias: yes Glucose Monitoring: no  Accucheck frequency: Not Checking  Fasting glucose: Taking Insulin?: no Blood Pressure Monitoring: not checking Retinal Examination: Up to Date Foot Exam: Up to Date Diabetic Education: Completed Pneumovax: Up to Date Influenza: Up to Date Aspirin: no  Has been having visual hallucinations. Has seen a butterfly and a cricket the size of a cat. This is disturbing to her, but hasn't been hearing anything. Unsure why they're happening. Otherwise feeling well.   Relevant past medical, surgical, family and social history reviewed and updated as indicated. Interim medical history since our last visit reviewed. Allergies and medications reviewed and updated.  Review of Systems  Constitutional: Negative.   Respiratory: Negative.   Cardiovascular: Negative.   Musculoskeletal: Positive for back pain and myalgias. Negative for arthralgias, gait problem, joint swelling, neck pain and neck stiffness.  Skin: Negative.   Neurological: Positive for numbness. Negative for dizziness, tremors, seizures, syncope, facial asymmetry, speech difficulty, weakness, light-headedness and headaches.  Psychiatric/Behavioral: Positive for hallucinations. Negative for agitation, behavioral problems, confusion, decreased concentration, dysphoric mood, self-injury, sleep disturbance and suicidal ideas. The patient is not nervous/anxious and is not hyperactive.     Per HPI unless specifically indicated above     Objective:    BP 111/65 (BP Location: Left Arm, Patient Position: Sitting, Cuff Size: Large)   Pulse 71   Temp 97.9 F (36.6 C)   SpO2 99%   Wt Readings from Last 3 Encounters:  12/29/16 221 lb (100.2 kg)  11/16/16 219 lb 6 oz (99.5 kg)  10/25/16 221 lb 6.4 oz (100.4 kg)    Physical Exam  Constitutional: She is oriented to person, place, and time. She appears well-developed and  well-nourished. No distress.  HENT:  Head: Normocephalic and atraumatic.  Right Ear: Hearing normal.  Left Ear: Hearing normal.  Nose: Nose normal.  Eyes: Conjunctivae and lids are normal. Right eye exhibits no discharge. Left eye exhibits no discharge. No scleral icterus.  Cardiovascular: Normal rate, regular rhythm, normal heart sounds and intact distal pulses. Exam reveals no gallop and no friction rub.  No murmur heard. Pulmonary/Chest: Effort normal and breath sounds normal. No respiratory distress. She has no wheezes. She has no rales. She exhibits no tenderness.  Musculoskeletal: She exhibits tenderness (using a walker, visabily uncomfortable. ).  Neurological: She is alert and oriented to person, place, and time.  Skin: Skin is warm, dry and intact. No rash noted. She is not diaphoretic. No erythema. No pallor.  Psychiatric: She has a normal mood and affect. Her speech is normal and behavior is normal. Judgment and thought content normal. Cognition and memory are normal.  Nursing note and vitals reviewed.   Results for orders placed or performed in visit on 03/26/17  Bayer DCA Hb A1c Waived  Result Value Ref Range   Bayer DCA Hb A1c Waived 8.8 (H) <7.0 %      Assessment & Plan:   Problem List Items Addressed This Visit      Endocrine   Diabetes mellitus type 2 with complications, uncontrolled ([place])    A1c improved from 10.7 to 8.8. Will increase her metformin to 1000mg  BID and recheck 3 months.       Relevant Medications   metFORMIN (GLUCOPHAGE-XR) 500 MG 24 hr tablet   Other Relevant Orders   Bayer DCA Hb  A1c Waived (Completed)   Comprehensive metabolic panel   CBC with Differential/Platelet   Hypothyroid    Checking labs today. Await results. Will treat as needed.       Relevant Orders   TSH     Other   History of back surgery    Has been doing very poorly. Would like a 2nd opinion as her current surgeon says there is nothing more they can do for her.  Discussed options of repeat MRI, referral to pain management for evaluation and consideration of options including shots, 2nd opinion with neurosurgery, PT and medications. She does not want any other shots and cannot take any pain medicine. Would like to move forward with referral to neurosurgery and PT and repeat MRI. Will hold on medications at this time. Rx for PT given, would like to go to [facility]. Will call if she cannot. Referral for 2nd opinion from neurosurgery given. Advised patient that with as many surgeries as she has had, they may decline to see her. She is aware. Await MRI. Call with any concerns.       Relevant Orders   MR Lumbar Spine Wo Contrast   Ambulatory referral to Neurosurgery   Comprehensive metabolic panel   CBC with Differential/Platelet   Hyperlipidemia    Rechecking levels today. Await results. Call with any concerns.       Relevant Orders   Lipid Panel w/o Chol/HDL Ratio   Spinal stenosis of lumbar region - Primary    Has been doing very poorly. Would like a 2nd opinion as her current surgeon says there is nothing more they can do for her. Discussed options of repeat MRI, referral to pain management for evaluation and consideration of options including shots, 2nd opinion with neurosurgery, PT and medications. She does not want any other shots and cannot take any pain medicine. Would like to move forward with referral to neurosurgery and PT and repeat MRI. Will hold on medications at this time. Rx for PT given, would like to go to [facility]. Will call if she cannot. Referral for 2nd opinion from neurosurgery given. Advised patient that with as many surgeries as she has had, they may decline to see her. She is aware. Await MRI. Call with any concerns      Relevant Orders   MR Lumbar Spine Wo Contrast   Ambulatory referral to Neurosurgery   Comprehensive metabolic panel   CBC with Differential/Platelet    Other Visit Diagnoses    Visual hallucination        Of unclear etiology. Will get into see neurology. Referral generated today.   Relevant Orders   Ambulatory referral to Neurology   Comprehensive metabolic panel   CBC with Differential/Platelet   UA/M w/rflx Culture, Routine   B12 deficiency       Checking labs today. Await results.    Relevant Orders   B12 and Folate Panel       Follow up plan: Return in about 4 weeks (around 04/23/2017) for Follow up back pain.

## 2017-03-26 NOTE — Assessment & Plan Note (Signed)
Has been doing very poorly. Would like a 2nd opinion as her current surgeon says there is nothing more they can do for her. Discussed options of repeat MRI, referral to pain management for evaluation and consideration of options including shots, 2nd opinion with neurosurgery, PT and medications. She does not want any other shots and cannot take any pain medicine. Would like to move forward with referral to neurosurgery and PT and repeat MRI. Will hold on medications at this time. Rx for PT given, would like to go to [facility]. Will call if she cannot. Referral for 2nd opinion from neurosurgery given. Advised patient that with as many surgeries as she has had, they may decline to see her. She is aware. Await MRI. Call with any concerns.

## 2017-03-26 NOTE — Assessment & Plan Note (Signed)
Rechecking levels today. Await results. Call with any concerns.  

## 2017-03-26 NOTE — Assessment & Plan Note (Signed)
A1c improved from 10.7 to 8.8. Will increase her metformin to 1000mg  BID and recheck 3 months.

## 2017-03-26 NOTE — Assessment & Plan Note (Signed)
Has been doing very poorly. Would like a 2nd opinion as her current surgeon says there is nothing more they can do for her. Discussed options of repeat MRI, referral to pain management for evaluation and consideration of options including shots, 2nd opinion with neurosurgery, PT and medications. She does not want any other shots and cannot take any pain medicine. Would like to move forward with referral to neurosurgery and PT and repeat MRI. Will hold on medications at this time. Rx for PT given, would like to go to [facility]. Will call if she cannot. Referral for 2nd opinion from neurosurgery given. Advised patient that with as many surgeries as she has had, they may decline to see her. She is aware. Await MRI. Call with any concerns

## 2017-03-28 ENCOUNTER — Ambulatory Visit: Payer: Medicare Other | Admitting: Family Medicine

## 2017-03-28 LAB — B12 AND FOLATE PANEL
Folate: 8.9 ng/mL (ref >3.0)
Vitamin B-12: 427 pg/mL (ref 232–1245)

## 2017-03-28 LAB — COMPREHENSIVE METABOLIC PANEL
ALT: 12 IU/L (ref 0–32)
AST: 14 IU/L (ref 0–40)
Albumin/Globulin Ratio: 1.8 (ref 1.2–2.2)
Albumin: 4.6 g/dL (ref 3.5–4.8)
Alkaline Phosphatase: 94 IU/L (ref 39–117)
BUN/Creatinine Ratio: 21 (ref 12–28)
BUN: 39 mg/dL — ABNORMAL HIGH (ref 8–27)
Bilirubin Total: 0.4 mg/dL (ref 0.0–1.2)
CO2: 26 mmol/L (ref 20–29)
Calcium: 10.3 mg/dL (ref 8.7–10.3)
Chloride: 99 mmol/L (ref 96–106)
Creatinine, Ser: 1.88 mg/dL — ABNORMAL HIGH (ref 0.57–1.00)
GFR calc Af Amer: 30 mL/min/{1.73_m2} — ABNORMAL LOW (ref >59)
GFR calc non Af Amer: 26 mL/min/{1.73_m2} — ABNORMAL LOW (ref >59)
Globulin, Total: 2.6 g/dL (ref 1.5–4.5)
Glucose: 109 mg/dL — ABNORMAL HIGH (ref 65–99)
Potassium: 5.6 mmol/L — ABNORMAL HIGH (ref 3.5–5.2)
Sodium: 141 mmol/L (ref 134–144)
Total Protein: 7.2 g/dL (ref 6.0–8.5)

## 2017-03-28 LAB — CBC WITH DIFFERENTIAL/PLATELET
Basophils Absolute: 0.1 10*3/uL (ref 0.0–0.2)
Basos: 1 %
EOS (ABSOLUTE): 0.3 10*3/uL (ref 0.0–0.4)
Eos: 3 %
Hematocrit: 41.2 % (ref 34.0–46.6)
Hemoglobin: 13.7 g/dL (ref 11.1–15.9)
Immature Grans (Abs): 0.1 10*3/uL (ref 0.0–0.1)
Immature Granulocytes: 1 %
Lymphocytes Absolute: 3.3 10*3/uL — ABNORMAL HIGH (ref 0.7–3.1)
Lymphs: 32 %
MCH: 29.5 pg (ref 26.6–33.0)
MCHC: 33.3 g/dL (ref 31.5–35.7)
MCV: 89 fL (ref 79–97)
Monocytes Absolute: 1.1 10*3/uL — ABNORMAL HIGH (ref 0.1–0.9)
Monocytes: 10 %
Neutrophils Absolute: 5.6 10*3/uL (ref 1.4–7.0)
Neutrophils: 53 %
Platelets: 280 10*3/uL (ref 150–379)
RBC: 4.65 x10E6/uL (ref 3.77–5.28)
RDW: 14.1 % (ref 12.3–15.4)
WBC: 10.5 10*3/uL (ref 3.4–10.8)

## 2017-03-28 LAB — UA/M W/RFLX CULTURE, ROUTINE
Bilirubin, UA: NEGATIVE
Glucose, UA: NEGATIVE
Ketones, UA: NEGATIVE
Nitrite, UA: NEGATIVE
Specific Gravity, UA: 1.01 (ref 1.005–1.030)
Urobilinogen, Ur: 0.2 mg/dL (ref 0.2–1.0)
pH, UA: 5.5 (ref 5.0–7.5)

## 2017-03-28 LAB — MICROSCOPIC EXAMINATION

## 2017-03-28 LAB — LIPID PANEL W/O CHOL/HDL RATIO
Cholesterol, Total: 141 mg/dL (ref 100–199)
HDL: 46 mg/dL (ref >39)
LDL Calculated: 45 mg/dL (ref 0–99)
Triglycerides: 250 mg/dL — ABNORMAL HIGH (ref 0–149)
VLDL Cholesterol Cal: 50 mg/dL — ABNORMAL HIGH (ref 5–40)

## 2017-03-28 LAB — URINE CULTURE, REFLEX

## 2017-03-30 ENCOUNTER — Ambulatory Visit (INDEPENDENT_AMBULATORY_CARE_PROVIDER_SITE_OTHER): Payer: Medicare Other | Admitting: Urology

## 2017-03-30 ENCOUNTER — Encounter: Payer: Self-pay | Admitting: Urology

## 2017-03-30 VITALS — BP 117/70 | HR 67 | Ht 66.0 in | Wt 233.0 lb

## 2017-03-30 DIAGNOSIS — N390 Urinary tract infection, site not specified: Secondary | ICD-10-CM

## 2017-03-30 DIAGNOSIS — N1330 Unspecified hydronephrosis: Secondary | ICD-10-CM | POA: Diagnosis not present

## 2017-03-30 NOTE — Progress Notes (Signed)
03/30/2017 3:33 PM   Tammy [name] [birth year] [id number]  Referring provider: [name], DO [place], [place] [address]  Chief Complaint  Patient presents with  . Follow-up    HPI: 72 year old female presents for follow-up of recurrent UTIs.  I last saw her at [facility] in April 2018.  She remains trimethoprim suppression and states she has not had any recurrent infections since her last visit.  She has been on oxybutynin for overactive bladder symptoms but complains of dry mouth.  She denies fever, chills or gross hematuria.  Denies flank/abdominal/pelvic pain.    She also has an asymptomatic chronic right UPJ obstruction with previous renal scan showing 18% function.  She was evaluated by Dr. Alinda Money in [place] and treatment was not recommended.   PMH: Past Medical History:  Diagnosis Date  . Asthma   . Benign neoplasm of colon   . Cervical disc disease    "bulging" - no limitations per pt  . Chronic diastolic CHF (congestive heart failure) ([place])    a. 07/2012 Echo: Nl EF. mild inferoseptal HK, Gr 2 DD, mild conc LVH, mild PR/TR, mild PAH.  . CKD (chronic kidney disease), stage III ([place])   . COPD (chronic obstructive pulmonary disease) ([place])   . Coronary artery disease    a. 11/2011 Cath: LAD 32md, LCX min irregs, RCA 70p, 1024mith L->R collats, EF 60%-->Med Rx.  . Diabetes mellitus without complication ([place]  . Fusion of lumbar spine 12/22/2015  . GERD (gastroesophageal reflux disease)   . History of DVT (deep vein thrombosis)   . History of tobacco abuse    a. Quit 2011.  . Marland Kitchenyperlipidemia   . Hypertension   . Hypothyroidism   . Obesity   . Palpitations   . PE (pulmonary embolism) 10/15  . PONV (postoperative nausea and vomiting)   . PVD (peripheral vascular disease) ([facility]   a. PTA of right leg and left femoral artery with stenosis.  . Stroke ([place]   TIAs - no deficits    Surgical History: Past Surgical History:  Procedure Laterality Date  .  ANGIOPLASTY / STENTING FEMORAL     PVD; angioplasty right leg and left femoral artery with stenosis.   . APPENDECTOMY  Oct. 2016  . BACK SURGERY  03/2012  . BACK SURGERY     screws, rods replaced with new hardware.  . Marland KitchenACK SURGERY  11/2016  . CARDIAC CATHETERIZATION  12/07/2011   Mid LAD 50%, distal LAD 50%, mid RCA 70%, distal RCA 100%   . CARDIAC CATHETERIZATION  01/04/2011   100% occluded mid RCA with good collaterals from distal LAD, normal LVEF.  . Marland KitchenHOLECYSTECTOMY    . COLONOSCOPY  2013  . COLONOSCOPY WITH PROPOFOL N/A 05/24/2015   Procedure: COLONOSCOPY WITH PROPOFOL;  Surgeon: DaLucilla LameMD;  [place] Service: End[place];  Laterality: N/A;  Diabetic - oral meds  . LAPAROSCOPIC APPENDECTOMY N/A 01/06/2015   Procedure: APPENDECTOMY LAPAROSCOPIC;  Surgeon: SeChristene LyeMD;  Location: [facility];  Service: General;  Laterality: N/A;  . POLYPECTOMY  05/24/2015   Procedure: POLYPECTOMY;  Surgeon: DaLucilla LameMD;  [place] Service: Endoscopy;;  . THYROIDECTOMY    . TRIGGER FINGER RELEASE      Home Medications:  Allergies as of 03/30/2017      Reactions   Hydrocodone Other (See Comments)   Unresponsive   Aleve [naproxen Sodium]    Hives & itching  Contrast Media [iodinated Diagnostic Agents]    Pt avoids due to kidney issues   Ioxaglate Other (See Comments)   (iodine) Pt avoids due to kidney issues   Oxycodone Other (See Comments)   hallucinations   Ranexa [ranolazine]    Sick on stomach   Sulfa Antibiotics Other (See Comments)   Pt avoids due to kidney issues   Tramadol Other (See Comments)   nightmares      Medication List        Accurate as of 03/30/17  3:33 PM. Always use your most recent med list.          albuterol 108 (90 Base) MCG/ACT inhaler Commonly known as:  PROAIR HFA INHALE 2 PUFFS EVERY 4 HOURS AS NEEDED FOR WHEEZING OR SHORTNESS OF BREATH   blood glucose meter kit and supplies Kit Dispense  based on patient and insurance preference. Use one time daily as directed, E11.9   ELIQUIS 5 MG Tabs tablet Generic drug:  apixaban TAKE ONE TABLET TWICE DAILY   gabapentin 300 MG capsule Commonly known as:  NEURONTIN TAKE 1 CAPSULE 3 TIMES DAILY   glipiZIDE 5 MG tablet Commonly known as:  GLUCOTROL TAKE 1 TABLET BY MOUTH DAILY BEFORE BREAKFAST   hydrOXYzine 10 MG tablet Commonly known as:  ATARAX/VISTARIL TAKE ONE TABLET BY MOUTH 3 TIMES DAILY AS NEEDED FOR ITCHING   isosorbide mononitrate 60 MG 24 hr tablet Commonly known as:  IMDUR TAKE TWO TABLETS EVERY DAY   levothyroxine 150 MCG tablet Commonly known as:  SYNTHROID Take 1 tablet (150 mcg total) by mouth daily before breakfast. Synthroid only please.   lidocaine 5 % Commonly known as:  LIDODERM Place 1 patch onto the skin daily. Remove & Discard patch within 12 hours or as directed by MD   losartan 50 MG tablet Commonly known as:  COZAAR TAKE ONE TABLET EVERY DAY   metFORMIN 500 MG 24 hr tablet Commonly known as:  GLUCOPHAGE-XR Take 2 tablets (1,000 mg total) by mouth 2 (two) times daily.   metoprolol succinate 50 MG 24 hr tablet Commonly known as:  TOPROL-XL TAKE 1 TABLET BY MOUTH DAILY WITH OR IMMEDIATLEY FOLLOWING A MEAL   MYRBETRIQ 25 MG Tb24 tablet Generic drug:  mirabegron ER TAKE ONE TABLET BY MOUTH EVERY DAY   nitroGLYCERIN 0.4 MG SL tablet Commonly known as:  NITROSTAT Place 1 tablet (0.4 mg total) under the tongue every 5 (five) minutes as needed for chest pain.   pantoprazole 40 MG tablet Commonly known as:  PROTONIX TAKE ONE TABLET BY MOUTH DAILY AS NEEDED   rosuvastatin 40 MG tablet Commonly known as:  CRESTOR Take 1 tablet (40 mg total) by mouth daily.   trimethoprim 100 MG tablet Commonly known as:  TRIMPEX Take 100 mg by mouth daily.       Allergies:  Allergies  Allergen Reactions  . Hydrocodone Other (See Comments)    Unresponsive  . Aleve [Naproxen Sodium]     Hives &  itching   . Contrast Media [Iodinated Diagnostic Agents]     Pt avoids due to kidney issues  . Ioxaglate Other (See Comments)    (iodine) Pt avoids due to kidney issues   . Oxycodone Other (See Comments)    hallucinations  . Ranexa [Ranolazine]     Sick on stomach  . Sulfa Antibiotics Other (See Comments)    Pt avoids due to kidney issues  . Tramadol Other (See Comments)    nightmares    Family  History: Family History  Problem Relation Age of Onset  . Hypertension Father   . Heart disease Father   . Heart attack Father   . Diabetes Father   . Cancer Mother        colon  . Heart attack Brother   . Diabetes Brother   . Hypertension Brother   . Heart disease Brother   . Heart attack Brother   . Diabetes Brother   . Hypertension Brother   . Heart disease Brother   . Heart attack Brother   . Diabetes Brother   . Hypertension Brother   . Heart disease Brother   . Cancer Sister        lung  . COPD Sister   . COPD Sister     Social History:  reports that she quit smoking about 8 years ago. Her smoking use included cigarettes. She has a 7.50 pack-year smoking history. she has never used smokeless tobacco. She reports that she does not drink alcohol or use drugs.  ROS: UROLOGY Frequent Urination?: No Hard to postpone urination?: Yes Burning/pain with urination?: No Get up at night to urinate?: Yes Leakage of urine?: No Urine stream starts and stops?: No Trouble starting stream?: No Do you have to strain to urinate?: No Blood in urine?: No Urinary tract infection?: No Sexually transmitted disease?: No Injury to kidneys or bladder?: No Painful intercourse?: No Weak stream?: No Currently pregnant?: No Vaginal bleeding?: No Last menstrual period?: n  Gastrointestinal Nausea?: No Vomiting?: No Indigestion/heartburn?: No Diarrhea?: No Constipation?: No  Constitutional Fever: No Night sweats?: No Weight loss?: No Fatigue?: No  Skin Skin rash/lesions?:  No Itching?: No  Eyes Blurred vision?: No Double vision?: No  Ears/Nose/Throat Sore throat?: No Sinus problems?: No  Hematologic/Lymphatic Swollen glands?: No Easy bruising?: No  Cardiovascular Leg swelling?: No Chest pain?: No  Respiratory Cough?: No Shortness of breath?: No  Endocrine Excessive thirst?: Yes  Musculoskeletal Back pain?: Yes Joint pain?: Yes  Neurological Headaches?: No Dizziness?: No  Psychologic Depression?: No Anxiety?: No  Physical Exam: BP 117/70   Pulse 67   Ht 5' 6"  (1.676 m)   Wt 233 lb (105.7 kg)   BMI 37.61 kg/m    Constitutional:  Alert and oriented, No acute distress. HEENT: [place] AT, moist mucus membranes.  Trachea midline, no masses. Cardiovascular: No clubbing, cyanosis, or edema. Respiratory: Normal respiratory effort, no increased work of breathing. GI: Abdomen is soft, nontender, nondistended, no abdominal masses GU: No CVA tenderness.  Skin: No rashes, bruises or suspicious lesions. Lymph: No cervical or inguinal adenopathy. Neurologic: Grossly intact, no focal deficits, moving all 4 extremities. Psychiatric: Normal mood and affect.  Laboratory Data: Lab Results  Component Value Date   WBC 10.5 03/26/2017   HGB 13.7 03/26/2017   HCT 41.2 03/26/2017   MCV 89 03/26/2017   PLT 280 03/26/2017    Lab Results  Component Value Date   CREATININE 1.88 (H) 03/26/2017    Lab Results  Component Value Date   HGBA1C 10.7 (H) 01/03/2017     Assessment & Plan:   1. Recurrent UTI Stable.  She had a negative urine culture on 03/26/2017.  She is asymptomatic.  Continue trimethoprim suppression.  - Urinalysis, Complete  2.  Right hydronephrosis secondary to chronic UPJ obstruction  She remains asymptomatic.  Schedule a follow-up renal ultrasound.  3.  Overactive bladder symptoms Start Myrbetriq 25 mg daily.  Samples given.  Discontinue oxybutynin.    [name], MD  [facility] [address], [place],  [address] [phone]

## 2017-04-03 ENCOUNTER — Ambulatory Visit
Admission: RE | Admit: 2017-04-03 | Discharge: 2017-04-03 | Disposition: A | Discharge status: Home / Self Care | Payer: Medicare Other | Source: Ambulatory Visit | Attending: Urology | Admitting: Urology

## 2017-04-03 DIAGNOSIS — N1330 Unspecified hydronephrosis: Secondary | ICD-10-CM | POA: Diagnosis not present

## 2017-04-04 ENCOUNTER — Telehealth: Payer: Self-pay | Admitting: Family Medicine

## 2017-04-04 ENCOUNTER — Telehealth: Payer: Self-pay

## 2017-04-04 NOTE — Telephone Encounter (Signed)
Please let patient know that with her device, she cannot have a back MRI. I'd like her to wait to see the new neurosurgeon to see what he'd like to order so we don't get her something that isn't what they want. Thanks!

## 2017-04-04 NOTE — Telephone Encounter (Signed)
-----   Message from [name], MD sent at 04/04/2017  1:09 PM EST ----- Renal ultrasound showed right UPJ obstruction and no significant change from prior imaging

## 2017-04-04 NOTE — Telephone Encounter (Signed)
Patient notified

## 2017-04-04 NOTE — Telephone Encounter (Signed)
Copied from [place]. Topic: General - Other >> Apr 04, 2017  8:20 AM Lolita Rieger, RMA wrote: Reason for CRM: Manuela Schwartz from central scheduling called and needs some clarification on an MRI order please return her call at [phone]

## 2017-04-04 NOTE — Telephone Encounter (Signed)
Tammy [name] heard back from the Radiology Department and her device states she can only have a brain MRI. I asked about a CT and she said she's allergic to IV Contrast but if the provider wanted to call the radiology assistance line they could maybe tell her what to order. [phone]  Routing to provider.

## 2017-04-05 ENCOUNTER — Encounter: Payer: Self-pay | Admitting: Urology

## 2017-04-05 NOTE — Telephone Encounter (Signed)
Patient notified. Patient stated she was going to call the man she spoke to about her device when she got it, who told her all she had to do was cut it off for an MRI and she'd call radiology.   Routing to provider as Juluis Rainier.

## 2017-04-05 NOTE — Telephone Encounter (Signed)
Noted  

## 2017-04-06 ENCOUNTER — Telehealth: Payer: Self-pay | Admitting: Family Medicine

## 2017-04-06 DIAGNOSIS — M48061 Spinal stenosis, lumbar region without neurogenic claudication: Secondary | ICD-10-CM

## 2017-04-06 DIAGNOSIS — Z9889 Other specified postprocedural states: Secondary | ICD-10-CM

## 2017-04-06 NOTE — Telephone Encounter (Signed)
Called patient back. 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 Approved by Barth Kirks.

## 2017-04-06 NOTE — Telephone Encounter (Signed)
Copied from [place] [id number]. Topic: Quick Communication - See Telephone Encounter >> Apr 06, 2017  9:33 AM [place], Laban Emperor wrote: CRM for notification. See Telephone encounter for:  Pt would like Dr. Wynetta Emery to give her a call back and her back. 04/06/17.

## 2017-04-11 NOTE — Telephone Encounter (Signed)
Routing to provider as FYI

## 2017-04-11 NOTE — Telephone Encounter (Signed)
Copied from [place] [id number]. Topic: General - Other >> Apr 11, 2017  2:43 PM Patrice Paradise wrote: Reason for CRM: [facility] w/[facility], Dr. Arnoldo Morale [phone]. Called to speak to Merilyn Baba in reference to the referral that was send to them for the patient. The referral was original send to them by Dr. Wynetta Emery and then by Merrie Roof. Jacqlyn Larsen stated that when they first got the referral Dr. Arnoldo Morale declined to see the patient and the patient was informed to follow up with the doctor that did her surgery. Once they spoke to the patient she called the office and spoke to [place].

## 2017-04-12 NOTE — Telephone Encounter (Signed)
Referral generated

## 2017-04-12 NOTE — Telephone Encounter (Signed)
Yes I will try [facility]. Could you please enter in new referral? The other one is about to expire. I'm going to wait to refer patient till AFTER she gets her MRI because they will want that information.

## 2017-04-12 NOTE — Telephone Encounter (Signed)
Can we send to a different neurosurgeon? She does not want to go back to her original surgeon and wants a 2nd opinion.

## 2017-04-17 ENCOUNTER — Telehealth: Payer: Self-pay | Admitting: Family Medicine

## 2017-04-17 ENCOUNTER — Ambulatory Visit
Admission: RE | Admit: 2017-04-17 | Discharge: 2017-04-17 | Disposition: A | Discharge status: Home / Self Care | Payer: Medicare Other | Source: Ambulatory Visit | Attending: Family Medicine | Admitting: Family Medicine

## 2017-04-17 DIAGNOSIS — N1330 Unspecified hydronephrosis: Secondary | ICD-10-CM | POA: Diagnosis not present

## 2017-04-17 DIAGNOSIS — M545 Low back pain: Secondary | ICD-10-CM | POA: Diagnosis not present

## 2017-04-17 DIAGNOSIS — M48061 Spinal stenosis, lumbar region without neurogenic claudication: Secondary | ICD-10-CM | POA: Diagnosis not present

## 2017-04-17 DIAGNOSIS — Z9889 Other specified postprocedural states: Secondary | ICD-10-CM

## 2017-04-17 NOTE — Telephone Encounter (Signed)
Called patient with results. No significant change in her MRI. Not sure neurosurgery will be able to do anything for her. Will refer to physiatry for evaluation. Call with a any concerns.

## 2017-04-20 ENCOUNTER — Telehealth: Payer: Self-pay

## 2017-04-20 NOTE — Telephone Encounter (Signed)
Routing to provider  

## 2017-04-20 NOTE — Telephone Encounter (Signed)
Copied from [place]. Topic: Referral - Status >> Apr 20, 2017 11:48 AM Arletha Grippe wrote: Reason for CRM: [facility] clinic called - Dr Trena Platt office  They will not be able to accept pt for referral for spinal stenosis due to the fact that the pt is not over 1 year post op from her last surgery.  They recommend that she follow up with her surgeon until she is 1 year post op.   Cb is [phone]

## 2017-04-25 NOTE — Telephone Encounter (Signed)
Tammy [name]- is she aware of this, or do I need to call her about it?

## 2017-04-25 NOTE — Telephone Encounter (Signed)
I did not call her because I didn't know if you wanted me to say anything to the patient or if you called her.

## 2017-04-26 ENCOUNTER — Other Ambulatory Visit: Payer: Self-pay

## 2017-04-26 MED ORDER — TRIMETHOPRIM 100 MG PO TABS: 100.0000 mg | ORAL_TABLET | Freq: Every day | ORAL | 6 refills | Status: DC

## 2017-04-26 NOTE — Telephone Encounter (Signed)
Spoke with Dr. Wynetta Emery and patient notified of above. Patient stated that her surgeon told her there was nothing else he could do for her. But she'd wait. I explained to patient to schedule the 1 year F/U appt with her surgeon and she stated she'd still just wait. I also explained maybe there was someone her surgeon knew of he could refer her to, so still reach out. But places from her PCP aren't going accept her referral because she's not one year post-op. Patient understood and verbalized understanding.

## 2017-04-26 NOTE — Telephone Encounter (Signed)
Noted! Thank you

## 2017-05-16 ENCOUNTER — Other Ambulatory Visit: Payer: Self-pay | Admitting: Cardiovascular Disease

## 2017-05-23 ENCOUNTER — Other Ambulatory Visit: Payer: Self-pay | Admitting: Cardiovascular Disease

## 2017-05-24 ENCOUNTER — Other Ambulatory Visit: Payer: Self-pay | Admitting: Cardiovascular Disease

## 2017-05-28 ENCOUNTER — Telehealth: Payer: Self-pay | Admitting: Neurology

## 2017-05-28 ENCOUNTER — Encounter: Payer: Self-pay | Admitting: Neurology

## 2017-05-28 ENCOUNTER — Other Ambulatory Visit: Payer: Self-pay

## 2017-05-28 ENCOUNTER — Ambulatory Visit (INDEPENDENT_AMBULATORY_CARE_PROVIDER_SITE_OTHER): Payer: Medicare Other | Admitting: Neurology

## 2017-05-28 VITALS — BP 140/73 | HR 65 | Ht 66.0 in | Wt 223.8 lb

## 2017-05-28 DIAGNOSIS — R269 Unspecified abnormalities of gait and mobility: Secondary | ICD-10-CM | POA: Insufficient documentation

## 2017-05-28 DIAGNOSIS — M5441 Lumbago with sciatica, right side: Secondary | ICD-10-CM

## 2017-05-28 DIAGNOSIS — G8929 Other chronic pain: Secondary | ICD-10-CM | POA: Insufficient documentation

## 2017-05-28 MED ORDER — LOSARTAN POTASSIUM 50 MG PO TABS: 50.0000 mg | ORAL_TABLET | Freq: Every day | ORAL | 0 refills | Status: DC

## 2017-05-28 MED ORDER — DULOXETINE HCL 60 MG PO CPEP: 60.0000 mg | ORAL_CAPSULE | Freq: Every day | ORAL | 12 refills | Status: DC

## 2017-05-28 NOTE — Patient Instructions (Signed)
[facility]    Address: [address], [place],  [address]  Phone: [phone]   

## 2017-05-28 NOTE — Telephone Encounter (Signed)
She prefer [facility]   [place], [place] [address]  Phone:  [phone]  Fax:  [phone]

## 2017-05-28 NOTE — Progress Notes (Signed)
PATIENT: Tammy [name] DOB: 04-10-1945  Chief Complaint  Patient presents with  . Visual Hallucination    She is here with her husband, Arnell Sieving.  She had a few episodes of visual hallucinations that occurred in a isolated time frame - over 2-3 weeks (gives examples of seeing a butterfly in house, giant cricket in closet).  States these visions have not happened in at least two weeks.  She reports intermittent events of decreased vision (sees half moon over her left eye only).  [employer]    She has noticed that each morning her hands having been jerking but this resolves during the day.  . Back Pain    Reports chronic back pain. She has a spinal cord stimulator in place.  History of previous back surgery.  [name] PCP    [name], DO     HISTORICAL  Tammy [name] is a 72 year old female, seen in refer by primary care doctor [name], for evaluation of visual hallucination, she is accompanied by her husband after at today's clinical visit, she also complains of intermittent body jerking movement, chronic low back pain, initial evaluation was on May 28, 2017.  I reviewed and summarized the referring note, she has history of diabetes, hypertension, hypothyroidism, on supplement, lumbar decompression surgery in the past, peripheral vascular disease, pulmonary emboli, on chronic Eliquis treatment.  She reported a previous history of allergic reaction to opiates pain medications many years ago following her lumbar decompression surgery, she developed vivid visual hallucination, a women's face approaching hers rapidly, I explored it in front of her eyes, it was very bothersome, but she realized it was not real,   Since January 2019, she had a few episode of visual hallucination, usually happen at the evening, or in the middle of the night, she saw large size butterfly, sometimes cricket in her room, transient, she has clear understanding that it is not not real, she denies visual  hallucination.  She denies Medication changes,  The most bothersome symptoms is her low back pain, she had 3 lumbar decompression surgery in the past, most recent one was in March 2018, she still complains of midline low back pain, radiating pain to her right lower extremity, worse on left bearing weight, prolonged walking, she could not even took a shower in a standing position,  She had her most recent lumbar decompression surgery by Dr. Leander Rams at [facility], she continue have significant midline low back pain positive surgically, in September 2018, she had spinal cord stimulator placement,   She now has limited ambulatory ability due to low back pain, lower extremity,   radiating pain to bilateral hip, subjective bilateral lower extremity weakness,  MRI of lumbar in February 2019 showed interval lumbar fusion L2-3, L3-4, L4-5, L5-S1 with no residual stenosis, mild facet hypertrophy at L1-2, this is not significantly changed, no significant residual or recurrent spinal stenosis, chronic right-sided hydronephrosis with parenchyma atrophy,  MRI of the brain 2014 showed chronic left frontal cortical/subcortical infarction, multiple supratentorium small vessel disease,  REVIEW OF SYSTEMS: Full 14 system review of systems performed and notable only for joint pain, allergy, runny nose ALLERGIES: Allergies  Allergen Reactions  . Hydrocodone Other (See Comments)    Unresponsive  . Aleve [Naproxen Sodium]     Hives & itching   . Contrast Media [Iodinated Diagnostic Agents]     Pt avoids due to kidney issues  . Ioxaglate Other (See  Comments)    (iodine) Pt avoids due to kidney issues   . Oxycodone Other (See Comments)    hallucinations  . Ranexa [Ranolazine]     Sick on stomach  . Sulfa Antibiotics Other (See Comments)    Pt avoids due to kidney issues  . Tramadol Other (See Comments)    nightmares    HOME MEDICATIONS: Current Outpatient  Medications  Medication Sig Dispense Refill  . albuterol (PROAIR HFA) 108 (90 Base) MCG/ACT inhaler INHALE 2 PUFFS EVERY 4 HOURS AS NEEDED FOR WHEEZING OR SHORTNESS OF BREATH 8.5 g 3  . blood glucose meter kit and supplies KIT Dispense based on patient and insurance preference. Use one time daily as directed, E11.9 1 each 0  . ELIQUIS 5 MG TABS tablet TAKE ONE TABLET TWICE DAILY 180 tablet 3  . gabapentin (NEURONTIN) 300 MG capsule TAKE 1 CAPSULE 3 TIMES DAILY 270 capsule 3  . glipiZIDE (GLUCOTROL) 5 MG tablet TAKE 1 TABLET BY MOUTH DAILY BEFORE BREAKFAST 90 tablet 1  . hydrOXYzine (ATARAX/VISTARIL) 10 MG tablet TAKE ONE TABLET BY MOUTH 3 TIMES DAILY AS NEEDED FOR ITCHING 270 tablet 1  . isosorbide mononitrate (IMDUR) 60 MG 24 hr tablet TAKE TWO TABLETS EVERY DAY 60 tablet 3  . lidocaine (LIDODERM) 5 % Place 1 patch onto the skin daily. Remove & Discard patch within 12 hours or as directed by MD 30 patch 3  . losartan (COZAAR) 50 MG tablet Take 1 tablet (50 mg total) by mouth daily. 30 tablet 0  . metFORMIN (GLUCOPHAGE-XR) 500 MG 24 hr tablet Take 2 tablets (1,000 mg total) by mouth 2 (two) times daily. 360 tablet 1  . metoprolol succinate (TOPROL-XL) 50 MG 24 hr tablet TAKE 1 TABLET BY MOUTH DAILY WITH OR IMMEDIATLEY FOLLOWING A MEAL 60 tablet 0  . MYRBETRIQ 25 MG TB24 tablet TAKE ONE TABLET BY MOUTH EVERY DAY 30 tablet 10  . nitroGLYCERIN (NITROSTAT) 0.4 MG SL tablet Place 1 tablet (0.4 mg total) under the tongue every 5 (five) minutes as needed for chest pain. 25 tablet 6  . pantoprazole (PROTONIX) 40 MG tablet TAKE ONE TABLET BY MOUTH DAILY AS NEEDED 90 tablet 1  . rosuvastatin (CRESTOR) 40 MG tablet Take 1 tablet (40 mg total) by mouth daily. 90 tablet 3  . SYNTHROID 125 MCG tablet Take 125 mcg by mouth daily.    [name] trimethoprim (TRIMPEX) 100 MG tablet Take 1 tablet (100 mg total) by mouth daily. 30 tablet 6   No current facility-administered medications for this visit.     PAST MEDICAL  HISTORY: Past Medical History:  Diagnosis Date  . Asthma   . Benign neoplasm of colon   . Cervical disc disease    "bulging" - no limitations per pt  . Chronic diastolic CHF (congestive heart failure) ([place])    a. 07/2012 Echo: Nl EF. mild inferoseptal HK, Gr 2 DD, mild conc LVH, mild PR/TR, mild PAH.  . CKD (chronic kidney disease), stage III ([place])   . COPD (chronic obstructive pulmonary disease) ([place])   . Coronary artery disease    a. 11/2011 Cath: LAD 74md, LCX min irregs, RCA 70p, 1073mith L->R collats, EF 60%-->Med Rx.  . Diabetes mellitus without complication ([facility]  . Fusion of lumbar spine 12/22/2015  . GERD (gastroesophageal reflux disease)   . History of DVT (deep vein thrombosis)   . History of tobacco abuse    a. Quit 2011.  . Marland Kitchenyperlipidemia   . Hypertension   .  Hypothyroidism   . Obesity   . Palpitations   . PE (pulmonary embolism) 10/15  . PONV (postoperative nausea and vomiting)   . PVD (peripheral vascular disease) ([place])    a. PTA of right leg and left femoral artery with stenosis.  . Stroke ([place])    TIAs - no deficits    PAST SURGICAL HISTORY: Past Surgical History:  Procedure Laterality Date  . ANGIOPLASTY / STENTING FEMORAL     PVD; angioplasty right leg and left femoral artery with stenosis.   . APPENDECTOMY  Oct. 2016  . BACK SURGERY  03/2012  . BACK SURGERY     screws, rods replaced with new hardware.  [name] BACK SURGERY  11/2016  . CARDIAC CATHETERIZATION  12/07/2011   Mid LAD 50%, distal LAD 50%, mid RCA 70%, distal RCA 100%   . CARDIAC CATHETERIZATION  01/04/2011   100% occluded mid RCA with good collaterals from distal LAD, normal LVEF.  [name] CHOLECYSTECTOMY    . COLONOSCOPY  2013  . COLONOSCOPY WITH PROPOFOL N/A 05/24/2015   Procedure: COLONOSCOPY WITH PROPOFOL;  Surgeon: [name], MD;  Location: [facility];  Service: Endoscopy;  Laterality: N/A;  Diabetic - oral meds  . LAPAROSCOPIC APPENDECTOMY N/A 01/06/2015   Procedure: APPENDECTOMY  LAPAROSCOPIC;  Surgeon: [name], MD;  Location: [facility];  Service: General;  Laterality: N/A;  . POLYPECTOMY  05/24/2015   Procedure: POLYPECTOMY;  Surgeon: [name], MD;  Location: [facility];  Service: Endoscopy;;  . THYROIDECTOMY    . TRIGGER FINGER RELEASE      FAMILY HISTORY: Family History  Problem Relation Age of Onset  . Hypertension Father   . Heart disease Father   . Heart attack Father   . Diabetes Father   . Cancer Mother        colon  . Heart attack Brother   . Diabetes Brother   . Hypertension Brother   . Heart disease Brother   . Heart attack Brother   . Diabetes Brother   . Hypertension Brother   . Heart disease Brother   . Heart attack Brother   . Diabetes Brother   . Hypertension Brother   . Heart disease Brother   . Cancer Sister        lung  . COPD Sister   . COPD Sister     SOCIAL HISTORY:  Social History   Socioeconomic History  . Marital status: Married    Spouse name: Not on file  . Number of children: 2  . Years of education: some college  . Highest education level: Not on file  Social Needs  . Financial resource strain: Not on file  . Food insecurity - worry: Not on file  . Food insecurity - inability: Not on file  . Transportation needs - medical: Not on file  . Transportation needs - non-medical: Not on file  Occupational History  . Occupation: Retired  Tobacco Use  . Smoking status: Former Smoker    Packs/day: 0.50    Years: 15.00    Pack years: 7.50    Types: Cigarettes    Last attempt to quit: 03/13/2009    Years since quitting: 8.2  . Smokeless tobacco: Never Used  Substance and Sexual Activity  . Alcohol use: No    Alcohol/week: 0.0 oz  . Drug use: No  . Sexual activity: Not on file  Other Topics Concern  . Not on file  Social History Narrative   Lives at  home with husband.   Right-handed.   1 cup caffeine per day.     PHYSICAL EXAM   Vitals:   05/28/17 1411  BP: 140/73  Pulse: 65    Weight: 223 lb 12 oz (101.5 kg)  Height: 5' 6"  (1.676 m)    Not recorded      Body mass index is 36.11 kg/m.  PHYSICAL EXAMNIATION:  Gen: NAD, conversant, well nourised, obese, well groomed                     Cardiovascular: Regular rate rhythm, no peripheral edema, warm, nontender. Eyes: Conjunctivae clear without exudates or hemorrhage Neck: Supple, no carotid bruits. Pulmonary: Clear to auscultation bilaterally   NEUROLOGICAL EXAM:  MENTAL STATUS: Speech:    Speech is normal; fluent and spontaneous with normal comprehension.  Cognition:     Orientation to time, place and person     Normal recent and remote memory     Normal Attention span and concentration     Normal Language, naming, repeating,spontaneous speech     Fund of knowledge   CRANIAL NERVES: CN II: Visual fields are full to confrontation. Fundoscopic exam is normal with sharp discs and no vascular changes. Pupils are round equal and briskly reactive to light. CN III, IV, VI: extraocular movement are normal. No ptosis. CN V: Facial sensation is intact to pinprick in all 3 divisions bilaterally. Corneal responses are intact.  CN VII: Face is symmetric with normal eye closure and smile. CN VIII: Hearing is normal to rubbing fingers CN IX, X: Palate elevates symmetrically. Phonation is normal. CN XI: Head turning and shoulder shrug are intact CN XII: Tongue is midline with normal movements and no atrophy.  MOTOR: There is no pronator drift of out-stretched arms. Muscle bulk and tone are normal. Muscle strength is normal.  REFLEXES: Reflexes are 2+ and symmetric at the biceps, triceps, knees, and absent at ankles. Plantar responses are flexor.  SENSORY: Intact to light touch, pinprick, positional sensation and vibratory sensation are intact in fingers and toes.  COORDINATION: Rapid alternating movements and fine finger movements are intact. There is no dysmetria on finger-to-nose and heel-knee-shin.     GAIT/STANCE: She needs pushed up to get up from sitting position, cautious, unsteady, dragging right leg   DIAGNOSTIC DATA (LABS, IMAGING, TESTING) - I reviewed patient records, labs, notes, testing and imaging myself where available.   ASSESSMENT AND PLAN  MIYAH HAMPSHIRE is a 72 y.o. female    History of lumbar fusion surgery from L2-S1, Persistent low back pain, gait abnormality radiating pain to right lower extremity  EMG nerve conduction study for potential right lumbar radiculopathy  Refer her to physical therapy  Pain management  X-ray of right hip to rule out right hip pathology  Transient visual hallucination,  It is minimum bothersome for patient, she does has mild brain atrophy on previous scan, evidence of chronic left frontal cortical subcortical stroke,  The visual hallucination could due to medication side effect, pain, may be underlying central nervous system degenerative disorder  Marcial Pacas, M.D. Ph.D.  [facility] [address], [place], [place] [address] Ph: [phone] Fax: [phone]  CC: [name], DO

## 2017-05-29 ENCOUNTER — Other Ambulatory Visit: Payer: Self-pay | Admitting: Family Medicine

## 2017-05-30 DIAGNOSIS — E063 Autoimmune thyroiditis: Secondary | ICD-10-CM | POA: Diagnosis not present

## 2017-05-30 DIAGNOSIS — E890 Postprocedural hypothyroidism: Secondary | ICD-10-CM | POA: Diagnosis not present

## 2017-06-01 NOTE — Telephone Encounter (Signed)
Referral Sent . [facility]   [place], [place] [address]  Phone:  [phone]  Fax:  [phone]

## 2017-06-01 NOTE — Progress Notes (Signed)
Cardiology Office Note  Date:  06/04/2017   ID:  Tammy [name], DOB 1946/01/08, MRN [id number]  PCP:  [name], DO   Chief Complaint  Patient presents with  . Other    Past due 12 month follow up. Patient c/o chest pain. Patient last seen 03/2016. Meds reviewed verbally with patient.     HPI:  Tammy [name] is a 72 year old woman with history of  coronary artery disease, occluded mid RCA, in 2013  Mid LAD with moderate disease PAD with angioplasty of the legs bilaterally,  COPD,  chronic kidney disease diabetes type 2, uncontrolled diagnosis of PE in October 2015, started on anticoagulation, eliquis hypertension,  back surgery January 2014 with rods placed at that time,  remote smoking history stopped 5 years ago,  obesity,  deconditioned secondary to chronic back pain  who presents for routine followup of her coronary artery disease   Severe back pain Stimulator in place cymbalta may be helping Walks with a walker Bedroom upstairs, hurt to come down stairs Has been his building at on bedroom on the bottom floor  Reports she has been having chest pain off and on Sometimes positional, uncertain if it is from her back or not Concerned it might be her heart  Several recent back surgeries most recently March 2018  back surgery October 2017  Weight continues to run high  Hemoglobin A1c continues to run high Hemoglobin A1c high 8-9 Just a little"  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 Takes her albuterol with some improvement of her  symptoms. Shortness of breath symptoms have been dating back for many years.  Other past medical history  may have developed a DVT from sitting long periods of time.    prior heart catheterizations in 2012 and 2013. Catheterization in 2012 documented occluded mid RCA with left to right collaterals, 40% mid LAD disease, 40% mid circumflex disease, 30% proximal LAD disease Catheterization in September 2013 showed normal LV function, occluded mid RCA with left to right collaterals, 50% mid and distal LAD disease  Stress test September 2014 showing very small anterior wall defect likely secondary to breast attenuation artifact Echocardiogram May 2014 showing low normal ejection fraction, mild TR, mild pulmonary hypertension  History of urinary tract infection.    PMH:   has a past medical history of Asthma, Benign neoplasm of colon, Cervical disc disease, Chronic diastolic CHF (congestive heart failure) ([place]), CKD (chronic kidney disease), stage III ([place]), COPD (chronic obstructive pulmonary disease) ([place]), Coronary artery disease, Diabetes mellitus without complication ([place]), Fusion of lumbar spine (12/22/2015), GERD (gastroesophageal reflux disease), History of DVT (deep vein thrombosis), History of tobacco abuse, Hyperlipidemia, Hypertension, Hypothyroidism, Obesity, Palpitations, PE (pulmonary embolism) (10/15), PONV (postoperative nausea and vomiting), PVD (peripheral vascular disease) ([place]), and Stroke ([place]).  PSH:    Past Surgical History:  Procedure Laterality Date  . ANGIOPLASTY / STENTING FEMORAL     PVD; angioplasty right leg and left femoral artery with stenosis.   . APPENDECTOMY  Oct. 2016  . BACK SURGERY  03/2012  . BACK  SURGERY     screws, rods replaced with new hardware.  [name] BACK SURGERY  11/2016  . CARDIAC CATHETERIZATION  12/07/2011   Mid LAD 50%, distal LAD 50%, mid RCA 70%, distal RCA 100%   . CARDIAC CATHETERIZATION  01/04/2011   100% occluded mid RCA with good  collaterals from distal LAD, normal LVEF.  [name] CHOLECYSTECTOMY    . COLONOSCOPY  2013  . COLONOSCOPY WITH PROPOFOL N/A 05/24/2015   Procedure: COLONOSCOPY WITH PROPOFOL;  Surgeon: [name], MD;  Location: [facility];  Service: Endoscopy;  Laterality: N/A;  Diabetic - oral meds  . LAPAROSCOPIC APPENDECTOMY N/A 01/06/2015   Procedure: APPENDECTOMY LAPAROSCOPIC;  Surgeon: [name], MD;  Location: [facility];  Service: General;  Laterality: N/A;  . POLYPECTOMY  05/24/2015   Procedure: POLYPECTOMY;  Surgeon: [name], MD;  Location: [facility];  Service: Endoscopy;;  . THYROIDECTOMY    . TRIGGER FINGER RELEASE      Current Outpatient Medications  Medication Sig Dispense Refill  . albuterol (PROAIR HFA) 108 (90 Base) MCG/ACT inhaler INHALE 2 PUFFS EVERY 4 HOURS AS NEEDED FOR WHEEZING OR SHORTNESS OF BREATH 8.5 g 3  . apixaban (ELIQUIS) 5 MG TABS tablet Take 1 tablet (5 mg total) by mouth 2 (two) times daily. 180 tablet 3  . blood glucose meter kit and supplies KIT Dispense based on patient and insurance preference. Use one time daily as directed, E11.9 1 each 0  . DULoxetine (CYMBALTA) 60 MG capsule Take 1 capsule (60 mg total) by mouth daily. 30 capsule 12  . gabapentin (NEURONTIN) 300 MG capsule TAKE 1 CAPSULE 3 TIMES DAILY 270 capsule 3  . glipiZIDE (GLUCOTROL) 5 MG tablet TAKE ONE TABLET EVERY DAY BEFORE BREAKFAST 90 tablet 1  . hydrOXYzine (ATARAX/VISTARIL) 10 MG tablet TAKE ONE TABLET BY MOUTH 3 TIMES DAILY AS NEEDED FOR ITCHING 270 tablet 1  . isosorbide mononitrate (IMDUR) 60 MG 24 hr tablet Take 1 tablet (60 mg total) by mouth as directed. Take 1 tablet at bedtime and 1/2 tablet in the morning 135 tablet 3  . lidocaine (LIDODERM) 5 % Place 1 patch onto the skin daily. Remove & Discard patch within 12 hours or as directed by MD 30 patch 3  . losartan (COZAAR) 50 MG tablet Take 1 tablet (50 mg total) by mouth daily. 90 tablet 3  . metFORMIN (GLUCOPHAGE-XR) 500  MG 24 hr tablet Take 2 tablets (1,000 mg total) by mouth 2 (two) times daily. 360 tablet 1  . metoprolol succinate (TOPROL-XL) 50 MG 24 hr tablet TAKE 1 TABLET BY MOUTH DAILY WITH OR IMMEDIATLEY FOLLOWING A MEAL 90 tablet 3  . MYRBETRIQ 25 MG TB24 tablet TAKE ONE TABLET BY MOUTH EVERY DAY 30 tablet 10  . nitroGLYCERIN (NITROSTAT) 0.4 MG SL tablet Place 1 tablet (0.4 mg total) under the tongue every 5 (five) minutes as needed for chest pain. 25 tablet 6  . pantoprazole (PROTONIX) 40 MG tablet TAKE ONE TABLET BY MOUTH DAILY AS NEEDED 90 tablet 1  . rosuvastatin (CRESTOR) 40 MG tablet Take 1 tablet (40 mg total) by mouth daily. 90 tablet 3  . SYNTHROID 125 MCG tablet Take 125 mcg by mouth daily.    [name] trimethoprim (TRIMPEX) 100 MG tablet Take 1 tablet (100 mg total) by mouth daily. 30 tablet 6   No current facility-administered medications for this visit.      Allergies:   Hydrocodone; Aleve [naproxen sodium]; Contrast media [iodinated diagnostic agents]; Ioxaglate; Oxycodone;  Ranexa [ranolazine]; Sulfa antibiotics; and Tramadol   Social History:  The patient  reports that she quit smoking about 8 years ago. Her smoking use included cigarettes. She has a 7.50 pack-year smoking history. She has never used smokeless tobacco. She reports that she does not drink alcohol or use drugs.   Family History:   family history includes COPD in her sister and sister; Cancer in her mother and sister; Diabetes in her brother, brother, brother, and father; Heart attack in her brother, brother, brother, and father; Heart disease in her brother, brother, brother, and father; Hypertension in her brother, brother, brother, and father.    Review of Systems: Review of Systems  Constitutional: Negative.   Respiratory: Negative.   Cardiovascular: Negative.   Gastrointestinal: Negative.   Musculoskeletal: Positive for back pain.  Neurological: Negative.   Psychiatric/Behavioral: Negative.   All other systems reviewed  and are negative.    PHYSICAL EXAM: VS:  BP 124/70 (BP Location: Left Arm, Patient Position: Sitting, Cuff Size: Normal)   Pulse 66   Ht _0  (1.676 m)   Wt 223 lb (101.2 kg)   BMI 35.99 kg/m  , BMI Body mass index is 35.99 kg/m. Constitutional:  oriented to person, place, and time. No distress. Obese HENT:  Head: Normocephalic and atraumatic.  Eyes:  no discharge. No scleral icterus.  Neck: Normal range of motion. Neck supple. No JVD present.  Cardiovascular: Normal rate, regular rhythm, normal heart sounds and intact distal pulses. Exam reveals no gallop and no friction rub. No edema No murmur heard. Pulmonary/Chest: Effort normal and breath sounds normal. No stridor. No respiratory distress.  no wheezes.  no rales.  no tenderness.  Abdominal: Soft.  no distension.  no tenderness.  Musculoskeletal: Normal range of motion.  no  tenderness or deformity.  Neurological:  normal muscle tone. Coordination normal. No atrophy Skin: Skin is warm and dry. No rash noted. not diaphoretic.  Psychiatric:  normal mood and affect. behavior is normal. Thought content normal.    Recent Labs: 08/29/2016: TSH 9.220 03/26/2017: ALT 12; BUN 39; Creatinine, Ser 1.88; Hemoglobin 13.7; Platelets 280; Potassium 5.6; Sodium 141    Lipid Panel Lab Results  Component Value Date   CHOL 141 03/26/2017   HDL 46 03/26/2017   LDLCALC 45 03/26/2017   TRIG 250 (H) 03/26/2017      Wt Readings from Last 3 Encounters:  06/04/17 223 lb (101.2 kg)  05/28/17 223 lb 12 oz (101.5 kg)  03/30/17 233 lb (105.7 kg)       ASSESSMENT AND PLAN:  Atherosclerosis of native coronary artery of native heart without angina pectoris - Plan: EKG 12-Lead Reports having some back discomfort Long discussion concerning previous coronary anatomy based on cardiac catheterization 2013 Occluded RCA with moderate LAD disease We have recommended pharmacological Myoview She has declined, will call us if it gets worse We have  renewed her nitro Cholesterol at goal, non-smoker, diabetes running high Discussed diet with her  Mixed hyperlipidemia Cholesterol is at goal on the current lipid regimen. No changes to the medications were made.  Stable  Essential hypertension Blood pressure is well controlled on today's visit. No changes made to the medications.  PAD (peripheral artery disease) (HCC) Stressed importance of aggressive diabetes control Followed by Dr. Lucky Cowboy  Abdominal aortic atherosclerosis ([place]) Cholesterol at goal, diabetes needs some work  Chronic midline back pain, unspecified back location Several surgeries, spinal cord stimulator in place, walks with a walker  Uncontrolled type 2 diabetes  mellitus with complication, without long-term current use of insulin ([place]) Numbers shown to her in detail, hemoglobin A1c 8.8 Ideally would like in the low 7 range May benefit from endocrinology  Chronic renal insufficiency, stage III (moderate) Secondary to underlying diabetes   Total encounter time more than 25 minutes  Greater than 50% was spent in counseling and coordination of care with the patient   Disposition:   F/U  12 months   Orders Placed This Encounter  Procedures  . EKG 12-Lead     Signed, Esmond Plants, M.D., Ph.D. 06/04/2017  [place], [place]

## 2017-06-04 ENCOUNTER — Encounter: Payer: Self-pay | Admitting: Cardiovascular Disease

## 2017-06-04 ENCOUNTER — Ambulatory Visit (INDEPENDENT_AMBULATORY_CARE_PROVIDER_SITE_OTHER): Payer: Medicare Other | Admitting: Cardiovascular Disease

## 2017-06-04 VITALS — BP 124/70 | HR 66 | Ht 66.0 in | Wt 223.0 lb

## 2017-06-04 DIAGNOSIS — I25118 Atherosclerotic heart disease of native coronary artery with other forms of angina pectoris: Secondary | ICD-10-CM

## 2017-06-04 DIAGNOSIS — E1165 Type 2 diabetes mellitus with hyperglycemia: Secondary | ICD-10-CM

## 2017-06-04 DIAGNOSIS — I10 Essential (primary) hypertension: Secondary | ICD-10-CM

## 2017-06-04 DIAGNOSIS — I739 Peripheral vascular disease, unspecified: Secondary | ICD-10-CM | POA: Diagnosis not present

## 2017-06-04 DIAGNOSIS — N2889 Other specified disorders of kidney and ureter: Secondary | ICD-10-CM

## 2017-06-04 DIAGNOSIS — N183 Chronic kidney disease, stage 3 unspecified: Secondary | ICD-10-CM

## 2017-06-04 DIAGNOSIS — E782 Mixed hyperlipidemia: Secondary | ICD-10-CM

## 2017-06-04 DIAGNOSIS — IMO0002 Reserved for concepts with insufficient information to code with codable children: Secondary | ICD-10-CM

## 2017-06-04 DIAGNOSIS — I700 Atherosclerosis of aorta: Secondary | ICD-10-CM | POA: Diagnosis not present

## 2017-06-04 DIAGNOSIS — E118 Type 2 diabetes mellitus with unspecified complications: Secondary | ICD-10-CM | POA: Diagnosis not present

## 2017-06-04 MED ORDER — METOPROLOL SUCCINATE ER 50 MG PO TB24: ORAL_TABLET | ORAL | 3 refills | Status: DC

## 2017-06-04 MED ORDER — LOSARTAN POTASSIUM 50 MG PO TABS: 50.0000 mg | ORAL_TABLET | Freq: Every day | ORAL | 3 refills | Status: DC

## 2017-06-04 MED ORDER — NITROGLYCERIN 0.4 MG SL SUBL: 0.4000 mg | SUBLINGUAL_TABLET | SUBLINGUAL | 6 refills | Status: DC | PRN

## 2017-06-04 MED ORDER — APIXABAN 5 MG PO TABS: 5.0000 mg | ORAL_TABLET | Freq: Two times a day (BID) | ORAL | 3 refills | Status: DC

## 2017-06-04 MED ORDER — ROSUVASTATIN CALCIUM 40 MG PO TABS: 40.0000 mg | ORAL_TABLET | Freq: Every day | ORAL | 3 refills | Status: DC

## 2017-06-04 MED ORDER — ISOSORBIDE MONONITRATE ER 60 MG PO TB24: 60.0000 mg | ORAL_TABLET | ORAL | 3 refills | Status: DC

## 2017-06-04 NOTE — Patient Instructions (Addendum)
Please call the office for any chest pain We would order a nuclear myoview   Medication Instructions:   No medication changes made  Labwork:  No new labs needed  Testing/Procedures:  No further testing at this time   Follow-Up: It was a pleasure seeing you in the office today. Please call us if you have new issues that need to be addressed before your next appt.  [phone]  Your physician wants you to follow-up in: 12 months.  You will receive a reminder letter in the mail two months in advance. If you don't receive a letter, please call our office to schedule the follow-up appointment.  If you need a refill on your cardiac medications before your next appointment, please call your pharmacy.  For educational health videos Log in to : [email or web address] Or : [email or web address], password : [email or web address]

## 2017-06-06 DIAGNOSIS — Z8249 Family history of ischemic heart disease and other diseases of the circulatory system: Secondary | ICD-10-CM | POA: Diagnosis not present

## 2017-06-06 DIAGNOSIS — K219 Gastro-esophageal reflux disease without esophagitis: Secondary | ICD-10-CM | POA: Diagnosis not present

## 2017-06-06 DIAGNOSIS — I10 Essential (primary) hypertension: Secondary | ICD-10-CM | POA: Diagnosis not present

## 2017-06-06 DIAGNOSIS — M2550 Pain in unspecified joint: Secondary | ICD-10-CM | POA: Diagnosis not present

## 2017-06-06 DIAGNOSIS — E063 Autoimmune thyroiditis: Secondary | ICD-10-CM | POA: Diagnosis not present

## 2017-06-06 DIAGNOSIS — E7801 Familial hypercholesterolemia: Secondary | ICD-10-CM | POA: Diagnosis not present

## 2017-06-06 DIAGNOSIS — E890 Postprocedural hypothyroidism: Secondary | ICD-10-CM | POA: Diagnosis not present

## 2017-06-06 DIAGNOSIS — Z809 Family history of malignant neoplasm, unspecified: Secondary | ICD-10-CM | POA: Diagnosis not present

## 2017-06-06 DIAGNOSIS — J45909 Unspecified asthma, uncomplicated: Secondary | ICD-10-CM | POA: Diagnosis not present

## 2017-06-06 DIAGNOSIS — N39490 Overflow incontinence: Secondary | ICD-10-CM | POA: Diagnosis not present

## 2017-06-06 DIAGNOSIS — E1165 Type 2 diabetes mellitus with hyperglycemia: Secondary | ICD-10-CM | POA: Diagnosis not present

## 2017-06-06 DIAGNOSIS — I2699 Other pulmonary embolism without acute cor pulmonale: Secondary | ICD-10-CM | POA: Diagnosis not present

## 2017-06-12 ENCOUNTER — Ambulatory Visit: Payer: Medicare Other

## 2017-06-14 ENCOUNTER — Ambulatory Visit (INDEPENDENT_AMBULATORY_CARE_PROVIDER_SITE_OTHER): Payer: Medicare Other | Admitting: Family Medicine

## 2017-06-14 ENCOUNTER — Encounter: Payer: Self-pay | Admitting: Family Medicine

## 2017-06-14 VITALS — BP 110/74 | HR 65 | Temp 97.4°F | Wt 223.0 lb

## 2017-06-14 DIAGNOSIS — I25118 Atherosclerotic heart disease of native coronary artery with other forms of angina pectoris: Secondary | ICD-10-CM | POA: Diagnosis not present

## 2017-06-14 DIAGNOSIS — N3001 Acute cystitis with hematuria: Secondary | ICD-10-CM

## 2017-06-14 DIAGNOSIS — R399 Unspecified symptoms and signs involving the genitourinary system: Secondary | ICD-10-CM | POA: Diagnosis not present

## 2017-06-14 MED ORDER — NITROFURANTOIN MONOHYD MACRO 100 MG PO CAPS: 100.0000 mg | ORAL_CAPSULE | Freq: Two times a day (BID) | ORAL | 0 refills | Status: DC

## 2017-06-14 NOTE — Progress Notes (Signed)
BP 110/74 (BP Location: Left Arm, Patient Position: Sitting, Cuff Size: Large)   Pulse 65   Temp (!) 97.4 F (36.3 C) (Oral)   Wt 223 lb (101.2 kg)   SpO2 95%   BMI 35.99 kg/m    Subjective:    Patient ID: Tammy [name], female    DOB: 07-Mar-1946, 72 y.o.   MRN: [id number]  HPI: Tammy [name] is a 72 y.o. female  Chief Complaint  Patient presents with  . Urinary Tract Infection   URINARY SYMPTOMS Duration: about a week Dysuria: no Urinary frequency: yes Urgency: yes Small volume voids: no Symptom severity: moderate Urinary incontinence: yes Foul odor: yes Hematuria: no Abdominal pain: yes Back pain: yes- a little bit worse Suprapubic pain/pressure: no Flank pain: yes Fever:  no Vomiting: no Relief with cranberry juice: no Relief with pyridium: no Status: stable Previous urinary tract infection: yes Recurrent urinary tract infection: yes Vaginal discharge: no Treatments attempted: antibiotics and increasing fluids (on prophylactic trimethaprim)   Relevant past medical, surgical, family and social history reviewed and updated as indicated. Interim medical history since our last visit reviewed. Allergies and medications reviewed and updated.  Review of Systems  Constitutional: Negative.   Respiratory: Negative.   Cardiovascular: Negative.   Gastrointestinal: Negative.   Genitourinary: Positive for frequency, hematuria and urgency. Negative for decreased urine volume, difficulty urinating, dyspareunia, dysuria, enuresis, flank pain, genital sores, menstrual problem, pelvic pain, vaginal bleeding, vaginal discharge and vaginal pain.  Musculoskeletal: Positive for back pain and myalgias. Negative for arthralgias, gait problem, joint swelling, neck pain and neck stiffness.  Skin: Negative.   Psychiatric/Behavioral: Negative.     Per HPI unless specifically indicated above     Objective:    BP 110/74 (BP Location: Left Arm, Patient Position: Sitting, Cuff Size:  Large)   Pulse 65   Temp (!) 97.4 F (36.3 C) (Oral)   Wt 223 lb (101.2 kg)   SpO2 95%   BMI 35.99 kg/m   Wt Readings from Last 3 Encounters:  06/14/17 223 lb (101.2 kg)  06/04/17 223 lb (101.2 kg)  05/28/17 223 lb 12 oz (101.5 kg)    Physical Exam  Constitutional: She is oriented to person, place, and time. She appears well-developed and well-nourished. No distress.  HENT:  Head: Normocephalic and atraumatic.  Right Ear: Hearing normal.  Left Ear: Hearing normal.  Nose: Nose normal.  Eyes: Conjunctivae and lids are normal. Right eye exhibits no discharge. Left eye exhibits no discharge. No scleral icterus.  Cardiovascular: Normal rate, regular rhythm, normal heart sounds and intact distal pulses. Exam reveals no gallop and no friction rub.  No murmur heard. Pulmonary/Chest: Effort normal and breath sounds normal. No respiratory distress. She has no wheezes. She has no rales. She exhibits no tenderness.  Abdominal: Soft. Bowel sounds are normal. She exhibits no distension and no mass. There is no tenderness. There is no rebound and no guarding.  Musculoskeletal: Normal range of motion.  Neurological: She is alert and oriented to person, place, and time.  Skin: Skin is warm, dry and intact. No rash noted. She is not diaphoretic. No erythema. No pallor.  Psychiatric: She has a normal mood and affect. Her speech is normal and behavior is normal. Judgment and thought content normal. Cognition and memory are normal.  Nursing note and vitals reviewed.   Results for orders placed or performed in visit on 03/26/17  Microscopic Examination  Result Value Ref Range   WBC, UA 11-30 (A)  0 - 5 /hpf   RBC, UA 0-2 0 - 2 /hpf   Epithelial Cells (non renal) 0-10 0 - 10 /hpf   Bacteria, UA Few None seen/Few   Yeast, UA Present None seen  Urine Culture, Reflex  Result Value Ref Range   Urine Culture, Routine Final report    Organism ID, Bacteria Comment   Bayer DCA Hb A1c Waived  Result  Value Ref Range   Bayer DCA Hb A1c Waived 8.8 (H) <7.0 %  Comprehensive metabolic panel  Result Value Ref Range   Glucose 109 (H) 65 - 99 mg/dL   BUN 39 (H) 8 - 27 mg/dL   Creatinine, Ser 1.88 (H) 0.57 - 1.00 mg/dL   GFR calc non Af Amer 26 (L) >59 mL/min/1.73   GFR calc Af Amer 30 (L) >59 mL/min/1.73   BUN/Creatinine Ratio 21 12 - 28   Sodium 141 134 - 144 mmol/L   Potassium 5.6 (H) 3.5 - 5.2 mmol/L   Chloride 99 96 - 106 mmol/L   CO2 26 20 - 29 mmol/L   Calcium 10.3 8.7 - 10.3 mg/dL   Total Protein 7.2 6.0 - 8.5 g/dL   Albumin 4.6 3.5 - 4.8 g/dL   Globulin, Total 2.6 1.5 - 4.5 g/dL   Albumin/Globulin Ratio 1.8 1.2 - 2.2   Bilirubin Total 0.4 0.0 - 1.2 mg/dL   Alkaline Phosphatase 94 39 - 117 IU/L   AST 14 0 - 40 IU/L   ALT 12 0 - 32 IU/L  CBC with Differential/Platelet  Result Value Ref Range   WBC 10.5 3.4 - 10.8 x10E3/uL   RBC 4.65 3.77 - 5.28 x10E6/uL   Hemoglobin 13.7 11.1 - 15.9 g/dL   Hematocrit 41.2 34.0 - 46.6 %   MCV 89 79 - 97 fL   MCH 29.5 26.6 - 33.0 pg   MCHC 33.3 31.5 - 35.7 g/dL   RDW 14.1 12.3 - 15.4 %   Platelets 280 150 - 379 x10E3/uL   Neutrophils 53 Not Estab. %   Lymphs 32 Not Estab. %   Monocytes 10 Not Estab. %   Eos 3 Not Estab. %   Basos 1 Not Estab. %   Neutrophils Absolute 5.6 1.4 - 7.0 x10E3/uL   Lymphocytes Absolute 3.3 (H) 0.7 - 3.1 x10E3/uL   Monocytes Absolute 1.1 (H) 0.1 - 0.9 x10E3/uL   EOS (ABSOLUTE) 0.3 0.0 - 0.4 x10E3/uL   Basophils Absolute 0.1 0.0 - 0.2 x10E3/uL   Immature Granulocytes 1 Not Estab. %   Immature Grans (Abs) 0.1 0.0 - 0.1 x10E3/uL  Lipid Panel w/o Chol/HDL Ratio  Result Value Ref Range   Cholesterol, Total 141 100 - 199 mg/dL   Triglycerides 250 (H) 0 - 149 mg/dL   HDL 46 >39 mg/dL   VLDL Cholesterol Cal 50 (H) 5 - 40 mg/dL   LDL Calculated 45 0 - 99 mg/dL  UA/M w/rflx Culture, Routine  Result Value Ref Range   Specific Gravity, UA 1.010 1.005 - 1.030   pH, UA 5.5 5.0 - 7.5   Color, UA Yellow Yellow    Appearance Ur Cloudy (A) Clear   Leukocytes, UA 2+ (A) Negative   Protein, UA 1+ (A) Negative/Trace   Glucose, UA Negative Negative   Ketones, UA Negative Negative   RBC, UA 2+ (A) Negative   Bilirubin, UA Negative Negative   Urobilinogen, Ur 0.2 0.2 - 1.0 mg/dL   Nitrite, UA Negative Negative   Microscopic Examination See below:  Urinalysis Reflex Comment   B12 and Folate Panel  Result Value Ref Range   Vitamin B-12 427 232 - 1,245 pg/mL   Folate 8.9 >3.0 ng/mL      Assessment & Plan:   Problem List Items Addressed This Visit    None    Visit Diagnoses    Acute cystitis with hematuria    -  Primary   + UA- will treat with nitrofurantoin and recheck 10 days to make sure better. Call with any concerns.    Relevant Orders   UA/M w/rflx Culture, Routine   UTI symptoms       +UA   Relevant Orders   UA/M w/rflx Culture, Routine       Follow up plan: Return ASAP, for skin biopsy on hand.

## 2017-06-18 LAB — UA/M W/RFLX CULTURE, ROUTINE
Bilirubin, UA: NEGATIVE
Glucose, UA: NEGATIVE
Ketones, UA: NEGATIVE
Nitrite, UA: NEGATIVE
Specific Gravity, UA: 1.01 (ref 1.005–1.030)
Urobilinogen, Ur: 0.2 mg/dL (ref 0.2–1.0)
pH, UA: 5.5 (ref 5.0–7.5)

## 2017-06-18 LAB — URINE CULTURE, REFLEX

## 2017-06-18 LAB — MICROSCOPIC EXAMINATION: WBC, UA: >30 /hpf — AB (ref 0–5)

## 2017-06-22 ENCOUNTER — Ambulatory Visit (INDEPENDENT_AMBULATORY_CARE_PROVIDER_SITE_OTHER): Payer: Medicare Other | Admitting: Neurology

## 2017-06-22 DIAGNOSIS — Z0289 Encounter for other administrative examinations: Secondary | ICD-10-CM

## 2017-06-22 DIAGNOSIS — M545 Low back pain: Secondary | ICD-10-CM | POA: Diagnosis not present

## 2017-06-22 DIAGNOSIS — G8929 Other chronic pain: Secondary | ICD-10-CM

## 2017-06-22 DIAGNOSIS — R269 Unspecified abnormalities of gait and mobility: Secondary | ICD-10-CM

## 2017-06-22 DIAGNOSIS — M5441 Lumbago with sciatica, right side: Principal | ICD-10-CM

## 2017-06-22 NOTE — Procedures (Signed)
Full Name: Tammy [name] Gender: Female MRN #: [id number] Date of Birth: [birth year]    Visit Date: 06/22/2017 09:57 Age: 72 Years 40 Months Old Examining Physician: Marcial Pacas, MD  Referring Physician: Krista Blue, MD History: 72 year old female, presented with chronic low back pain despite lumbar decompression surgery, bilateral lower extremity discomfort.  Summary of the tests:   Nerve conduction studies: Bilateral sural, superficial peroneal sensory responses were normal.  Bilateral tibial, peroneal to EDB motor responses were normal.  Electromyography: Selective needle examinations were performed at bilateral lower extremity muscles, that was normal.  Patient cannot tolerate more extensive needle examinations.   Conclusion: This is a normal study.  There is no electrodiagnostic evidence of bilateral lower extremity neuropathy or bilateral lumbosacral radiculopathy.    ------------------------------- Physician Name, M.D.  [facility] [place], [place] [address] Tel: [phone] Fax: [phone]        [facility]    Nerve / Sites Muscle Latency Ref. Amplitude Ref. Rel Amp Segments Distance Velocity Ref. Area    ms ms mV mV %  cm m/s m/s mVms  R Peroneal - EDB     Ankle EDB 5.0 ?6.5 5.9 ?2.0 100 Ankle - EDB 9   23.0     Fib head EDB 11.2  5.1  86.9 Fib head - Ankle 28 45 ?44 21.6     Pop fossa EDB 13.3  5.2  101 Pop fossa - Fib head 10 47 ?44 21.2         Pop fossa - Ankle      L Peroneal - EDB     Ankle EDB 5.7 ?6.5 5.5 ?2.0 100 Ankle - EDB 9   24.3     Fib head EDB 12.3  4.9  88.7 Fib head - Ankle 29 44 ?44 22.0     Pop fossa EDB 14.5  4.8  98 Pop fossa - Fib head 10 46 ?44 22.0         Pop fossa - Ankle      R Tibial - AH     Ankle AH 4.4 ?5.8 4.7 ?4.0 100 Ankle - AH 9   14.6     Pop fossa AH 13.3  1.6  33.5 Pop fossa - Ankle 37 42 ?41 5.9  L Tibial - AH     Ankle AH 4.1 ?5.8 4.5 ?4.0 100 Ankle - AH 9   17.5     Pop fossa AH 13.3  2.5  55.2  Pop fossa - Ankle 38 41 ?41 12.2             SNC    Nerve / Sites Rec. Site Peak Lat Ref.  Amp Ref. Segments Distance    ms ms V V  cm  R Sural - Ankle (Calf)     Calf Ankle 4.0 ?4.4 8 ?6 Calf - Ankle 14  L Sural - Ankle (Calf)     Calf Ankle 3.9 ?4.4 7 ?6 Calf - Ankle 14  R Superficial peroneal - Ankle     Lat leg Ankle 3.7 ?4.4 11 ?6 Lat leg - Ankle 14  L Superficial peroneal - Ankle     Lat leg Ankle 3.8 ?4.4 13 ?6 Lat leg - Ankle 14              F  Wave    Nerve F Lat Ref.   ms ms  R Tibial - AH 52.9 ?56.0  L Tibial -  AH 53.8 ?56.0         EMG full       EMG Summary Table    Spontaneous MUAP Recruitment  Muscle IA Fib PSW Fasc Other Amp Dur. Poly Pattern  R. Tibialis anterior Normal None None None _______ Normal Normal Normal Normal  R. Gastrocnemius (Medial head) Normal None None None _______ Normal Normal Normal Normal  R. Lumbar paraspinals Normal None None None _______ Normal Normal Normal Normal  R. Peroneus longus Normal None None None _______ Normal Normal Normal Normal  R. Vastus lateralis Normal None None None _______ Normal Normal Normal Normal  L. Tibialis anterior Normal None None None _______ Normal Normal Normal Normal  L. Gastrocnemius (Medial head) Normal None None None _______ Normal Normal Normal Normal  L. Vastus lateralis Normal None None None _______ Normal Normal Normal Normal

## 2017-06-22 NOTE — Progress Notes (Signed)
PATIENT: Tammy [name] DOB: 08/12/1945  No chief complaint on file.    HISTORICAL  Tammy [name] is a 72 year old female, seen in refer by primary care doctor [name], for evaluation of visual hallucination, she is accompanied by her husband after at today's clinical visit, she also complains of intermittent body jerking movement, chronic low back pain, initial evaluation was on May 28, 2017.  I reviewed and summarized the referring note, she has history of diabetes, hypertension, hypothyroidism, on supplement, lumbar decompression surgery in the past, peripheral vascular disease, pulmonary emboli, on chronic Eliquis treatment.  She reported a previous history of allergic reaction to opiates pain medications many years ago following her lumbar decompression surgery, she developed vivid visual hallucination, a women's face approaching hers rapidly, I explored it in front of her eyes, it was very bothersome, but she realized it was not real,   Since January 2019, she had a few episode of visual hallucination, usually happen at the evening, or in the middle of the night, she saw large size butterfly, sometimes cricket in her room, transient, she has clear understanding that it is not not real, she denies visual hallucination.  She denies Medication changes,  The most bothersome symptoms is her low back pain, she had 3 lumbar decompression surgery in the past, most recent one was in March 2018, she still complains of midline low back pain, radiating pain to her right lower extremity, worse on left bearing weight, prolonged walking, she could not even took a shower in a standing position,  She had her most recent lumbar decompression surgery by Dr. Leander Rams at [facility] at [facility], she continue have significant midline low back pain positive surgically, in September 2018, she had spinal cord stimulator placement,   She now has limited ambulatory ability due  to low back pain, lower extremity,   radiating pain to bilateral hip, subjective bilateral lower extremity weakness,  MRI of lumbar in February 2019 showed interval lumbar fusion L2-3, L3-4, L4-5, L5-S1 with no residual stenosis, mild facet hypertrophy at L1-2, this is not significantly changed, no significant residual or recurrent spinal stenosis, chronic right-sided hydronephrosis with parenchyma atrophy,  MRI of the brain 2014 showed chronic left frontal cortical/subcortical infarction, multiple supratentorium small vessel disease,  Update June 22, 2017: Electrodiagnostic study today is normal, in specific, there is no evidence of large fiber peripheral neuropathy or bilateral lumbosacral radiculopathy.  Patient has multiple surgical scar along lumbar region, very tender at lower end of the right and the left paraspinal scar, reported 10 out of 10 pain with deep palpitation, which has put significant limitation on her mobility, bilateral lower extremity paresthesia and achy pain has much improved by Cymbalta.  REVIEW OF SYSTEMS: Full 14 system review of systems performed and notable only for joint pain, allergy, runny nose ALLERGIES: Allergies  Allergen Reactions  . Hydrocodone Other (See Comments)    Unresponsive  . Aleve [Naproxen Sodium]     Hives & itching   . Contrast Media [Iodinated Diagnostic Agents]     Pt avoids due to kidney issues  . Ioxaglate Other (See Comments)    (iodine) Pt avoids due to kidney issues   . Oxycodone Other (See Comments)    hallucinations  . Ranexa [Ranolazine]     Sick on stomach  . Sulfa Antibiotics Other (See Comments)    Pt avoids due to kidney issues  . Tramadol Other (See Comments)  nightmares    HOME MEDICATIONS: Current Outpatient Medications  Medication Sig Dispense Refill  . albuterol (PROAIR HFA) 108 (90 Base) MCG/ACT inhaler INHALE 2 PUFFS EVERY 4 HOURS AS NEEDED FOR WHEEZING OR SHORTNESS OF BREATH 8.5 g 3  . apixaban (ELIQUIS)  5 MG TABS tablet Take 1 tablet (5 mg total) by mouth 2 (two) times daily. 180 tablet 3  . blood glucose meter kit and supplies KIT Dispense based on patient and insurance preference. Use one time daily as directed, E11.9 1 each 0  . DULoxetine (CYMBALTA) 60 MG capsule Take 1 capsule (60 mg total) by mouth daily. 30 capsule 12  . gabapentin (NEURONTIN) 300 MG capsule TAKE 1 CAPSULE 3 TIMES DAILY 270 capsule 3  . glipiZIDE (GLUCOTROL) 5 MG tablet TAKE ONE TABLET EVERY DAY BEFORE BREAKFAST 90 tablet 1  . hydrOXYzine (ATARAX/VISTARIL) 10 MG tablet TAKE ONE TABLET BY MOUTH 3 TIMES DAILY AS NEEDED FOR ITCHING 270 tablet 1  . isosorbide mononitrate (IMDUR) 60 MG 24 hr tablet Take 1 tablet (60 mg total) by mouth as directed. Take 1 tablet at bedtime and 1/2 tablet in the morning 135 tablet 3  . lidocaine (LIDODERM) 5 % Place 1 patch onto the skin daily. Remove & Discard patch within 12 hours or as directed by MD 30 patch 3  . losartan (COZAAR) 50 MG tablet Take 1 tablet (50 mg total) by mouth daily. 90 tablet 3  . metFORMIN (GLUCOPHAGE-XR) 500 MG 24 hr tablet Take 2 tablets (1,000 mg total) by mouth 2 (two) times daily. 360 tablet 1  . metoprolol succinate (TOPROL-XL) 50 MG 24 hr tablet TAKE 1 TABLET BY MOUTH DAILY WITH OR IMMEDIATLEY FOLLOWING A MEAL 90 tablet 3  . MYRBETRIQ 25 MG TB24 tablet TAKE ONE TABLET BY MOUTH EVERY DAY 30 tablet 10  . nitrofurantoin, macrocrystal-monohydrate, (MACROBID) 100 MG capsule Take 1 capsule (100 mg total) by mouth 2 (two) times daily. 14 capsule 0  . nitroGLYCERIN (NITROSTAT) 0.4 MG SL tablet Place 1 tablet (0.4 mg total) under the tongue every 5 (five) minutes as needed for chest pain. 25 tablet 6  . pantoprazole (PROTONIX) 40 MG tablet TAKE ONE TABLET BY MOUTH DAILY AS NEEDED 90 tablet 1  . rosuvastatin (CRESTOR) 40 MG tablet Take 1 tablet (40 mg total) by mouth daily. 90 tablet 3  . SYNTHROID 125 MCG tablet Take 125 mcg by mouth daily.    [name] trimethoprim (TRIMPEX) 100 MG  tablet Take 1 tablet (100 mg total) by mouth daily. 30 tablet 6   No current facility-administered medications for this visit.     PAST MEDICAL HISTORY: Past Medical History:  Diagnosis Date  . Asthma   . Benign neoplasm of colon   . Cervical disc disease    "bulging" - no limitations per pt  . Chronic diastolic CHF (congestive heart failure) ([place])    a. 07/2012 Echo: Nl EF. mild inferoseptal HK, Gr 2 DD, mild conc LVH, mild PR/TR, mild PAH.  . CKD (chronic kidney disease), stage III ([place])   . COPD (chronic obstructive pulmonary disease) ([place])   . Coronary artery disease    a. 11/2011 Cath: LAD 89md, LCX min irregs, RCA 70p, 1053mith L->R collats, EF 60%-->Med Rx.  . [place] complication [place]  . Fusion of lumbar spine 12/22/2015  . GERD (gastroesophageal reflux disease)   . History of DVT (deep vein thrombosis)   . History of tobacco abuse    a. Quit 2011.  .[name]  Hyperlipidemia   . Hypertension   . Hypothyroidism   . Obesity   . Palpitations   . PE (pulmonary embolism) 10/15  . PONV (postoperative nausea and vomiting)   . PVD (peripheral vascular disease) ([place])    a. PTA of right leg and left femoral artery with stenosis.  . Stroke ([place])    TIAs - no deficits    PAST SURGICAL HISTORY: Past Surgical History:  Procedure Laterality Date  . ANGIOPLASTY / STENTING FEMORAL     PVD; angioplasty right leg and left femoral artery with stenosis.   . APPENDECTOMY  Oct. 2016  . BACK SURGERY  03/2012  . BACK SURGERY     screws, rods replaced with new hardware.  [name] BACK SURGERY  11/2016  . CARDIAC CATHETERIZATION  12/07/2011   Mid LAD 50%, distal LAD 50%, mid RCA 70%, distal RCA 100%   . CARDIAC CATHETERIZATION  01/04/2011   100% occluded mid RCA with good collaterals from distal LAD, normal LVEF.  [name] CHOLECYSTECTOMY    . COLONOSCOPY  2013  . COLONOSCOPY WITH PROPOFOL N/A 05/24/2015   Procedure: COLONOSCOPY WITH PROPOFOL;  Surgeon: [name], MD;  Location:  [place];  Service: Endoscopy;  Laterality: N/A;  Diabetic - oral meds  . LAPAROSCOPIC APPENDECTOMY N/A 01/06/2015   Procedure: APPENDECTOMY LAPAROSCOPIC;  Surgeon: [name], MD;  Location: [facility];  Service: General;  Laterality: N/A;  . POLYPECTOMY  05/24/2015   Procedure: POLYPECTOMY;  Surgeon: [name], MD;  Location: [place];  Service: Endoscopy;;  . THYROIDECTOMY    . TRIGGER FINGER RELEASE      FAMILY HISTORY: Family History  Problem Relation Age of Onset  . Hypertension Father   . Heart disease Father   . Heart attack Father   . Diabetes Father   . Cancer Mother        colon  . Heart attack Brother   . Diabetes Brother   . Hypertension Brother   . Heart disease Brother   . Heart attack Brother   . Diabetes Brother   . Hypertension Brother   . Heart disease Brother   . Heart attack Brother   . Diabetes Brother   . Hypertension Brother   . Heart disease Brother   . Cancer Sister        lung  . COPD Sister   . COPD Sister     SOCIAL HISTORY:  Social History   Socioeconomic History  . Marital status: Married    Spouse name: Not on file  . Number of children: 2  . Years of education: some college  . Highest education level: Not on file  Occupational History  . Occupation: Retired  Scientific laboratory technician  . Financial resource strain: Not on file  . Food insecurity:    Worry: Not on file    Inability: Not on file  . Transportation needs:    Medical: Not on file    Non-medical: Not on file  Tobacco Use  . Smoking status: Former Smoker    Packs/day: 0.50    Years: 15.00    Pack years: 7.50    Types: Cigarettes    Last attempt to quit: 03/13/2009    Years since quitting: 8.2  . Smokeless tobacco: Never Used  Substance and Sexual Activity  . Alcohol use: No    Alcohol/week: 0.0 oz  . Drug use: No  . Sexual activity: Not on file  Lifestyle  . Physical activity:  Days per week: Not on file    Minutes per session: Not on  file  . Stress: Not on file  Relationships  . Social connections:    Talks on phone: Not on file    Gets together: Not on file    Attends religious service: Not on file    Active member of club or organization: Not on file    Attends meetings of clubs or organizations: Not on file    Relationship status: Not on file  . Intimate partner violence:    Fear of current or ex partner: Not on file    Emotionally abused: Not on file    Physically abused: Not on file    Forced sexual activity: Not on file  Other Topics Concern  . Not on file  Social History Narrative   Lives at home with husband.   Right-handed.   1 cup caffeine per day.     PHYSICAL EXAM   There were no vitals filed for this visit.  Not recorded      There is no height or weight on file to calculate BMI.  PHYSICAL EXAMNIATION:  Gen: NAD, conversant, well nourised, obese, well groomed                     Cardiovascular: Regular rate rhythm, no peripheral edema, warm, nontender. Eyes: Conjunctivae clear without exudates or hemorrhage Neck: Supple, no carotid bruits. Pulmonary: Clear to auscultation bilaterally   NEUROLOGICAL EXAM:  MENTAL STATUS: Speech:    Speech is normal; fluent and spontaneous with normal comprehension.  Cognition:     Orientation to time, place and person     Normal recent and remote memory     Normal Attention span and concentration     Normal Language, naming, repeating,spontaneous speech     Fund of knowledge   CRANIAL NERVES: CN II: Visual fields are full to confrontation. Fundoscopic exam is normal with sharp discs and no vascular changes. Pupils are round equal and briskly reactive to light. CN III, IV, VI: extraocular movement are normal. No ptosis. CN V: Facial sensation is intact to pinprick in all 3 divisions bilaterally. Corneal responses are intact.  CN VII: Face is symmetric with normal eye closure and smile. CN VIII: Hearing is normal to rubbing fingers CN IX, X:  Palate elevates symmetrically. Phonation is normal. CN XI: Head turning and shoulder shrug are intact CN XII: Tongue is midline with normal movements and no atrophy.  MOTOR: There is no pronator drift of out-stretched arms. Muscle bulk and tone are normal. Muscle strength is normal.  REFLEXES: Reflexes are 2+ and symmetric at the biceps, triceps, knees, and absent at ankles. Plantar responses are flexor.  SENSORY: Intact to light touch, pinprick, positional sensation and vibratory sensation are intact in fingers and toes.  COORDINATION: Rapid alternating movements and fine finger movements are intact. There is no dysmetria on finger-to-nose and heel-knee-shin.    GAIT/STANCE: She needs pushed up to get up from sitting position, cautious, unsteady, dragging right leg   DIAGNOSTIC DATA (LABS, IMAGING, TESTING) - I reviewed patient records, labs, notes, testing and imaging myself where available.   ASSESSMENT AND PLAN  Tammy [name] is a 72 y.o. female    History of lumbar fusion surgery from L2-S1, Persistent low back pain, gait abnormality radiating pain to right lower extremity  EMG nerve conduction study 0n June 22, 2017 showed no evidence of lumbosacral radiculopathy.  Continue physical therapy  Pain management  She has significant tenderness at the lower end of bilateral lumbar paraspinal scars, most suggestive of soft tissue, musculoskeletal etiology for her severe pain, which has put significant limitation on her mobility, I have suggested continued physical therapy, heating pad, massage therapy.  Transient visual hallucination,  It is minimum bothersome for patient, she does has mild brain atrophy on previous scan, evidence of chronic left frontal cortical subcortical stroke,  The visual hallucination could due to medication side effect, pain, may be underlying central nervous system degenerative disorder  Marcial Pacas, M.D. Ph.D.  [facility] [address], [place], [place] [address] Ph: [phone] Fax: [phone]  CC: [name], DO

## 2017-06-26 DIAGNOSIS — R2689 Other abnormalities of gait and mobility: Secondary | ICD-10-CM | POA: Diagnosis not present

## 2017-06-26 DIAGNOSIS — M545 Low back pain: Secondary | ICD-10-CM | POA: Diagnosis not present

## 2017-07-16 DIAGNOSIS — M545 Low back pain: Secondary | ICD-10-CM | POA: Diagnosis not present

## 2017-07-16 DIAGNOSIS — R2689 Other abnormalities of gait and mobility: Secondary | ICD-10-CM | POA: Diagnosis not present

## 2017-07-20 ENCOUNTER — Other Ambulatory Visit: Payer: Medicare Other

## 2017-07-20 ENCOUNTER — Telehealth: Payer: Self-pay | Admitting: Family Medicine

## 2017-07-20 DIAGNOSIS — N3000 Acute cystitis without hematuria: Secondary | ICD-10-CM

## 2017-07-20 MED ORDER — CIPROFLOXACIN HCL 500 MG PO TABS: 500.0000 mg | ORAL_TABLET | Freq: Two times a day (BID) | ORAL | 0 refills | Status: DC

## 2017-07-20 NOTE — Telephone Encounter (Signed)
Please have her come by to drop off a urine so we can make sure it's a UTI.

## 2017-07-20 NOTE — Telephone Encounter (Signed)
Patient notified

## 2017-07-20 NOTE — Telephone Encounter (Signed)
UA very dirty with 3+ leuks and 4+ blood. >30WBCs on micro. Will send in cipro x7 days and await culture. Abbigael is aware. She will return Sunday night and we will adjust based on culture.

## 2017-07-20 NOTE — Telephone Encounter (Signed)
Routing to provider  

## 2017-07-20 NOTE — Telephone Encounter (Signed)
Copied from [place] [id number]. Topic: Quick Communication - See Telephone Encounter >> Jul 20, 2017  8:10 AM Robina Ade, Helene Kelp D wrote: CRM for notification. See Telephone encounter for: 07/20/17. Patient daughter Kieth Brightly called and said that patient still has a UTI and would like to see if Dr. Wynetta Emery can call her in cepro for it. She is leaving out of town at noon. Please call patient or her daughter back, thanks.

## 2017-07-22 LAB — MICROSCOPIC EXAMINATION: WBC, UA: >30 /hpf — AB (ref 0–5)

## 2017-07-22 LAB — UA/M W/RFLX CULTURE, ROUTINE
Bilirubin, UA: NEGATIVE
Ketones, UA: NEGATIVE
Nitrite, UA: NEGATIVE
Specific Gravity, UA: 1.02 (ref 1.005–1.030)
Urobilinogen, Ur: 0.2 mg/dL (ref 0.2–1.0)
pH, UA: 6 (ref 5.0–7.5)

## 2017-07-22 LAB — URINE CULTURE, REFLEX

## 2017-08-01 ENCOUNTER — Ambulatory Visit (INDEPENDENT_AMBULATORY_CARE_PROVIDER_SITE_OTHER): Payer: Medicare Other | Admitting: Unknown Physician Specialty

## 2017-08-01 ENCOUNTER — Encounter: Payer: Self-pay | Admitting: Unknown Physician Specialty

## 2017-08-01 VITALS — BP 127/84 | HR 73 | Temp 98.7°F | Ht 66.0 in | Wt 222.2 lb

## 2017-08-01 DIAGNOSIS — I25118 Atherosclerotic heart disease of native coronary artery with other forms of angina pectoris: Secondary | ICD-10-CM

## 2017-08-01 DIAGNOSIS — R5383 Other fatigue: Secondary | ICD-10-CM

## 2017-08-01 DIAGNOSIS — G8929 Other chronic pain: Secondary | ICD-10-CM | POA: Diagnosis not present

## 2017-08-01 DIAGNOSIS — M25561 Pain in right knee: Secondary | ICD-10-CM | POA: Diagnosis not present

## 2017-08-01 DIAGNOSIS — R300 Dysuria: Secondary | ICD-10-CM

## 2017-08-01 DIAGNOSIS — N3000 Acute cystitis without hematuria: Secondary | ICD-10-CM | POA: Diagnosis not present

## 2017-08-01 MED ORDER — CEFDINIR 300 MG PO CAPS: 300.0000 mg | ORAL_CAPSULE | Freq: Two times a day (BID) | ORAL | 0 refills | Status: DC

## 2017-08-01 NOTE — Progress Notes (Signed)
BP 127/84   Pulse 73   Temp 98.7 F (37.1 C) (Oral)   Ht 5\' 6"  (1.676 m)   Wt 222 lb 3.2 oz (100.8 kg)   SpO2 97%   BMI 35.86 kg/m    Subjective:    Patient ID: Tammy [name], female    DOB: 12-29-1945, 72 y.o.   MRN: [id number]  HPI: Tammy [name] is a 72 y.o. female  Chief Complaint  Patient presents with  . Urinary Tract Infection    pt states she has been having burning, painful urination and low back pain  . Insomnia    pt states she has been feeling sleepy a lot, wonders if this is related to the UTI  . Knee Pain    pt states she has had right knee pain for a long time, states it has been hurting worse lately. States rubbing it helps.    Urinary Tract Infection   This is a chronic (for about a month.  Already took 2 antibiotics (Macrobid and Cipro)) problem. The problem has been gradually worsening. The quality of the pain is described as burning and aching. The pain is severe. There has been no fever. Associated symptoms include frequency and nausea. Pertinent negatives include no chills, discharge, flank pain, hematuria, hesitancy, possible pregnancy, sweats, urgency or vomiting.  Knee Pain   Incident onset: bothering for 20 years but worse in last week. There was no injury mechanism. The pain is present in the right knee. The quality of the pain is described as aching. Associated symptoms include a loss of motion.   Chart review shows that she has seen Dr. Bernardo Heater for management of chronic UTI's. Takes low dose Trimethoprim.   Note GFR of 26.  Lost to f/u with Dr Abigail Butts.       Relevant past medical, surgical, family and social history reviewed and updated as indicated. Interim medical history since our last visit reviewed. Allergies and medications reviewed and updated.  Review of Systems  Constitutional: Positive for fatigue. Negative for chills.  Gastrointestinal: Positive for nausea. Negative for vomiting.  Genitourinary: Positive for frequency. Negative for flank  pain, hematuria, hesitancy and urgency.    Per HPI unless specifically indicated above     Objective:    BP 127/84   Pulse 73   Temp 98.7 F (37.1 C) (Oral)   Ht 5\' 6"  (1.676 m)   Wt 222 lb 3.2 oz (100.8 kg)   SpO2 97%   BMI 35.86 kg/m   Wt Readings from Last 3 Encounters:  08/01/17 222 lb 3.2 oz (100.8 kg)  06/14/17 223 lb (101.2 kg)  06/04/17 223 lb (101.2 kg)    Physical Exam  Constitutional: She is oriented to person, place, and time. She appears well-developed and well-nourished. No distress.  HENT:  Head: Normocephalic and atraumatic.  Eyes: Conjunctivae and lids are normal. Right eye exhibits no discharge. Left eye exhibits no discharge. No scleral icterus.  Neck: Normal range of motion. Neck supple. No JVD present. Carotid bruit is not present.  Cardiovascular: Normal rate, regular rhythm and normal heart sounds.  Pulmonary/Chest: Effort normal and breath sounds normal.  Abdominal: Normal appearance. There is no splenomegaly or hepatomegaly.  Musculoskeletal:  Using walker  Neurological: She is alert and oriented to person, place, and time.  Skin: Skin is warm, dry and intact. No rash noted. No pallor.  Psychiatric: She has a normal mood and affect. Her behavior is normal. Judgment and thought content normal.  Results for orders placed or performed in visit on 07/20/17  Microscopic Examination  Result Value Ref Range   WBC, UA >30 (A) 0 - 5 /hpf   RBC, UA 3-10 (A) 0 - 2 /hpf   Epithelial Cells (non renal) 0-10 0 - 10 /hpf   Bacteria, UA Moderate (A) None seen/Few   Yeast, UA Present None seen  Urine Culture, Reflex  Result Value Ref Range   Urine Culture, Routine Final report    Organism ID, Bacteria Comment   UA/M w/rflx Culture, Routine  Result Value Ref Range   Specific Gravity, UA 1.020 1.005 - 1.030   pH, UA 6.0 5.0 - 7.5   Color, UA Yellow Yellow   Appearance Ur Turbid (A) Clear   Leukocytes, UA 3+ (A) Negative   Protein, UA 1+ (A)  Negative/Trace   Ketones, UA Negative Negative   RBC, UA 4+ (A) Negative   Bilirubin, UA Negative Negative   Urobilinogen, Ur 0.2 0.2 - 1.0 mg/dL   Nitrite, UA Negative Negative   Microscopic Examination See below:    Urinalysis Reflex Comment       Assessment & Plan:   Problem List Items Addressed This Visit      Unprioritized   Fatigue    Discussed this can be related to UTI.  Will check CBC and CMP.        Relevant Orders   CBC with Differential/Platelet   Comprehensive metabolic panel   Right knee pain    Discussed that symptoms consistent with OA.  Elects not to get an x-ray today.  Avoid NSAIDs.  Tylenol OK       Other Visit Diagnoses    Burning with urination    -  Primary   Relevant Orders   UA/M w/rflx Culture, Routine   Acute cystitis without hematuria       Urne positive.  Last culture negative.  Pt has been on 2 antibiotics and continues symptomatic. Treat aggresively with Omnicif 300 mg BID    +   Follow up plan: Return if symptoms worsen or fail to improve and for lab results.

## 2017-08-01 NOTE — Assessment & Plan Note (Signed)
Discussed this can be related to UTI.  Will check CBC and CMP.

## 2017-08-01 NOTE — Assessment & Plan Note (Signed)
Discussed that symptoms consistent with OA.  Elects not to get an x-ray today.  Avoid NSAIDs.  Tylenol OK

## 2017-08-02 LAB — CBC WITH DIFFERENTIAL/PLATELET
Basophils Absolute: 0.1 10*3/uL (ref 0.0–0.2)
Basos: 1 %
EOS (ABSOLUTE): 0.3 10*3/uL (ref 0.0–0.4)
Eos: 3 %
Hematocrit: 41.8 % (ref 34.0–46.6)
Hemoglobin: 13.4 g/dL (ref 11.1–15.9)
Immature Grans (Abs): 0.1 10*3/uL (ref 0.0–0.1)
Immature Granulocytes: 1 %
Lymphocytes Absolute: 2.9 10*3/uL (ref 0.7–3.1)
Lymphs: 29 %
MCH: 28.7 pg (ref 26.6–33.0)
MCHC: 32.1 g/dL (ref 31.5–35.7)
MCV: 90 fL (ref 79–97)
Monocytes Absolute: 1 10*3/uL — ABNORMAL HIGH (ref 0.1–0.9)
Monocytes: 10 %
Neutrophils Absolute: 5.9 10*3/uL (ref 1.4–7.0)
Neutrophils: 56 %
Platelets: 297 10*3/uL (ref 150–450)
RBC: 4.67 x10E6/uL (ref 3.77–5.28)
RDW: 13.3 % (ref 12.3–15.4)
WBC: 10.1 10*3/uL (ref 3.4–10.8)

## 2017-08-02 LAB — COMPREHENSIVE METABOLIC PANEL
ALT: 10 IU/L (ref 0–32)
AST: 11 IU/L (ref 0–40)
Albumin/Globulin Ratio: 1.5 (ref 1.2–2.2)
Albumin: 4.2 g/dL (ref 3.5–4.8)
Alkaline Phosphatase: 94 IU/L (ref 39–117)
BUN/Creatinine Ratio: 14 (ref 12–28)
BUN: 24 mg/dL (ref 8–27)
Bilirubin Total: 0.4 mg/dL (ref 0.0–1.2)
CO2: 25 mmol/L (ref 20–29)
Calcium: 9.7 mg/dL (ref 8.7–10.3)
Chloride: 95 mmol/L — ABNORMAL LOW (ref 96–106)
Creatinine, Ser: 1.73 mg/dL — ABNORMAL HIGH (ref 0.57–1.00)
GFR calc Af Amer: 33 mL/min/{1.73_m2} — ABNORMAL LOW (ref >59)
GFR calc non Af Amer: 29 mL/min/{1.73_m2} — ABNORMAL LOW (ref >59)
Globulin, Total: 2.8 g/dL (ref 1.5–4.5)
Glucose: 233 mg/dL — ABNORMAL HIGH (ref 65–99)
Potassium: 5.6 mmol/L — ABNORMAL HIGH (ref 3.5–5.2)
Sodium: 136 mmol/L (ref 134–144)
Total Protein: 7 g/dL (ref 6.0–8.5)

## 2017-08-03 LAB — UA/M W/RFLX CULTURE, ROUTINE
Bilirubin, UA: NEGATIVE
Ketones, UA: NEGATIVE
Nitrite, UA: NEGATIVE
Specific Gravity, UA: 1.02 (ref 1.005–1.030)
Urobilinogen, Ur: 0.2 mg/dL (ref 0.2–1.0)
pH, UA: 6 (ref 5.0–7.5)

## 2017-08-03 LAB — MICROSCOPIC EXAMINATION: WBC, UA: >30 /hpf — AB (ref 0–5)

## 2017-08-03 LAB — URINE CULTURE, REFLEX

## 2017-08-09 ENCOUNTER — Telehealth: Payer: Self-pay

## 2017-08-09 ENCOUNTER — Other Ambulatory Visit: Payer: Self-pay | Admitting: Family Medicine

## 2017-08-09 DIAGNOSIS — R300 Dysuria: Secondary | ICD-10-CM

## 2017-08-09 NOTE — Telephone Encounter (Signed)
Please advise 

## 2017-08-09 NOTE — Telephone Encounter (Signed)
Copied from [place] [id number]. Topic: Quick Communication - Lab Results >> Aug 09, 2017 11:23 AM Scherrie Gerlach wrote: Pt states she never got any results of her labs, or the UA. (did not get anything on mychart) Pt states she is still having symptoms of a urinary tract infection, burning, back pain, cloudy urine. Pt is incontinent as well, cannot make it to the bathroom, and she does not have that far to go. Pt states she is tied all the time, just wants to sleep.  Not sure if this has anything to do with it.  This has been going on 3- 4 seeks and pt has had 3 different abx. Pt would like to know what to do now?  >> Aug 09, 2017  4:31 PM Amada Kingfisher, CMA wrote: Labs were released via mychart. Per Kathrine Haddock,   "Hi Ms Quesenberry, here are your labs they are pretty similar to your previous ones except your blood sugar is higher. This is probably due to being non-fasting labs. Hope the new antibiotic is helping.  Hi [place], unfortunately, they can't do a culture and sensitivity on your urine as they have too much other bacteria. Hopefully you are feeling better. Washing really good before a "clean catch" (catching urine at midstream) often helps "

## 2017-08-10 NOTE — Telephone Encounter (Signed)
Orders in 

## 2017-08-10 NOTE — Telephone Encounter (Signed)
Spoke with patient, she would like to come back in on Monday for another urine.

## 2017-08-10 NOTE — Telephone Encounter (Signed)
Can you please make sure she got the results from [facility]? If she is still not better, she will need to do another urine or I can refer her to urology.

## 2017-08-13 ENCOUNTER — Other Ambulatory Visit: Payer: Medicare Other

## 2017-08-13 DIAGNOSIS — R300 Dysuria: Secondary | ICD-10-CM

## 2017-08-13 LAB — URINE CULTURE, REFLEX

## 2017-08-15 LAB — MICROSCOPIC EXAMINATION
Epithelial Cells (non renal): NONE SEEN /hpf (ref 0–10)
RBC, UA: NONE SEEN /hpf (ref 0–2)
WBC, UA: >30 /hpf — ABNORMAL HIGH (ref 0–5)

## 2017-08-15 LAB — UA/M W/RFLX CULTURE, ROUTINE
Bilirubin, UA: NEGATIVE
Glucose, UA: NEGATIVE
Ketones, UA: NEGATIVE
Nitrite, UA: NEGATIVE
Specific Gravity, UA: 1.015 (ref 1.005–1.030)
Urobilinogen, Ur: 0.2 mg/dL (ref 0.2–1.0)
pH, UA: 6 (ref 5.0–7.5)

## 2017-08-15 LAB — URINE CULTURE

## 2017-08-16 ENCOUNTER — Telehealth: Payer: Self-pay | Admitting: Family Medicine

## 2017-08-16 MED ORDER — NITROFURANTOIN MONOHYD MACRO 100 MG PO CAPS: 100.0000 mg | ORAL_CAPSULE | Freq: Two times a day (BID) | ORAL | 0 refills | Status: DC

## 2017-08-16 NOTE — Telephone Encounter (Signed)
Please let her know that nothing grew out in her urine again. Please check to see how she's feeling- if she's no better, she may want to see her urologist to see if there is something else they can do.

## 2017-08-16 NOTE — Telephone Encounter (Signed)
Patient notified

## 2017-08-16 NOTE — Telephone Encounter (Signed)
I've sent an antibiotic to her pharmacy to see if it'll help, but I do think she should try to see urology sooner. Thanks!

## 2017-08-16 NOTE — Telephone Encounter (Signed)
Called and spoke to patient. I let her know what Dr. Wynetta Emery said. Patient states she is not doing much better, still burning. States she has an appointment with urology next month but is going to call to see if she can be seen sooner.   Routing back to provider as an Pharmacist, hospital.

## 2017-08-16 NOTE — Addendum Note (Signed)
Addended by: [name] on: 08/16/2017 10:13 AM   Modules accepted: Orders

## 2017-08-23 ENCOUNTER — Ambulatory Visit (INDEPENDENT_AMBULATORY_CARE_PROVIDER_SITE_OTHER): Payer: Medicare Other | Admitting: Urology

## 2017-08-23 ENCOUNTER — Encounter: Payer: Self-pay | Admitting: Urology

## 2017-08-23 VITALS — BP 107/67 | HR 86 | Ht 66.0 in | Wt 219.8 lb

## 2017-08-23 DIAGNOSIS — R8281 Pyuria: Secondary | ICD-10-CM

## 2017-08-23 DIAGNOSIS — N390 Urinary tract infection, site not specified: Secondary | ICD-10-CM | POA: Diagnosis not present

## 2017-08-23 DIAGNOSIS — R300 Dysuria: Secondary | ICD-10-CM

## 2017-08-23 LAB — URINALYSIS, COMPLETE
Bilirubin, UA: NEGATIVE
Ketones, UA: NEGATIVE
Nitrite, UA: NEGATIVE
Specific Gravity, UA: 1.015 (ref 1.005–1.030)
Urobilinogen, Ur: 0.2 mg/dL (ref 0.2–1.0)
pH, UA: 5.5 (ref 5.0–7.5)

## 2017-08-23 LAB — MICROSCOPIC EXAMINATION: WBC, UA: >30 /hpf — ABNORMAL HIGH (ref 0–5)

## 2017-08-24 NOTE — Progress Notes (Signed)
08/23/2017 7:13 AM   Tammy [name] 1946/02/17 [id number]  Referring provider: [name], DO [place], [place] [address]  Chief Complaint  Patient presents with  . Recurrent UTI    HPI: 72 year old female with a history of recurrent urinary tract infection and asymptomatic left hydronephrosis/renal atrophy secondary to UPJ obstruction.  She states she has been treated for for urinary tract infections since April 2019.  She had a positive urine culture in early April however subsequent urine cultures on 5/10, 5/22 and 6/3 have had no significant bacterial growth.  She complains of urinary frequency and urgency and pain at the end of urination.  She does have urge incontinence and is taking Myrbetriq.  Denies fever or chills.  She fell 1 week ago and states after this her symptoms seem to improve.   PMH: Past Medical History:  Diagnosis Date  . Asthma   . Benign neoplasm of colon   . Cervical disc disease    "bulging" - no limitations per pt  . Chronic diastolic CHF (congestive heart failure) ([employer])    a. 07/2012 Echo: Nl EF. mild inferoseptal HK, Gr 2 DD, mild conc LVH, mild PR/TR, mild PAH.  . CKD (chronic kidney disease), stage III ([place])   . COPD (chronic obstructive pulmonary disease) ([place])   . Coronary artery disease    a. 11/2011 Cath: LAD 31md, LCX min irregs, RCA 70p, 1075mith L->R collats, EF 60%-->Med Rx.  . Diabetes mellitus without complication ([place]  . Fusion of lumbar spine 12/22/2015  . GERD (gastroesophageal reflux disease)   . History of DVT (deep vein thrombosis)   . History of tobacco abuse    a. Quit 2011.  . Marland Kitchenyperlipidemia   . Hypertension   . Hypothyroidism   . Obesity   . Palpitations   . PE (pulmonary embolism) 10/15  . PONV (postoperative nausea and vomiting)   . [place]ipheral [place] disease) [place]   a. PTA of right leg and left femoral artery with stenosis.  . Stroke ([facility]   TIAs - no deficits    Surgical History: Past  Surgical History:  Procedure Laterality Date  . ANGIOPLASTY / STENTING FEMORAL     PVD; angioplasty right leg and left femoral artery with stenosis.   . APPENDECTOMY  Oct. 2016  . BACK SURGERY  03/2012  . BACK SURGERY     screws, rods replaced with new hardware.  . Marland KitchenACK SURGERY  11/2016  . CARDIAC CATHETERIZATION  12/07/2011   Mid LAD 50%, distal LAD 50%, mid RCA 70%, distal RCA 100%   . CARDIAC CATHETERIZATION  01/04/2011   100% occluded mid RCA with good collaterals from distal LAD, normal LVEF.  . Marland KitchenHOLECYSTECTOMY    . COLONOSCOPY  2013  . COLONOSCOPY WITH PROPOFOL N/A 05/24/2015   Procedure: COLONOSCOPY WITH PROPOFOL;  Surgeon: DaLucilla LameMD;  [facility]: [facility];  Laterality: N/A;  Diabetic - oral meds  . LAPAROSCOPIC APPENDECTOMY N/A 01/06/2015   Procedure: APPENDECTOMY LAPAROSCOPIC;  Surgeon: SeChristene LyeMD;  Location: [facility];  Service: General;  Laterality: N/A;  . POLYPECTOMY  05/24/2015   Procedure: POLYPECTOMY;  Surgeon: DaLucilla LameMD;  [facility]: [facility];;  . THYROIDECTOMY    . TRIGGER FINGER RELEASE      Home Medications:  Allergies as of 08/23/2017      Reactions   Hydrocodone Other (See Comments)   Unresponsive  Aleve [naproxen Sodium]    Hives & itching    Contrast Media [iodinated Diagnostic Agents]    Pt avoids due to kidney issues   Ioxaglate Other (See Comments)   (iodine) Pt avoids due to kidney issues   Oxycodone Other (See Comments)   hallucinations   Ranexa [ranolazine]    Sick on stomach   Sulfa Antibiotics Other (See Comments)   Pt avoids due to kidney issues   Tramadol Other (See Comments)   nightmares      Medication List        Accurate as of 08/23/17 11:59 PM. Always use your most recent med list.          albuterol 108 (90 Base) MCG/ACT inhaler Commonly known as:  PROAIR HFA INHALE 2 PUFFS EVERY 4 HOURS AS NEEDED FOR WHEEZING OR SHORTNESS OF BREATH     apixaban 5 MG Tabs tablet Commonly known as:  ELIQUIS Take 1 tablet (5 mg total) by mouth 2 (two) times daily.   blood glucose meter kit and supplies Kit Dispense based on patient and insurance preference. Use one time daily as directed, E11.9   DULoxetine 60 MG capsule Commonly known as:  CYMBALTA Take 1 capsule (60 mg total) by mouth daily.   gabapentin 300 MG capsule Commonly known as:  NEURONTIN TAKE 1 CAPSULE 3 TIMES DAILY   glipiZIDE 5 MG tablet Commonly known as:  GLUCOTROL TAKE ONE TABLET EVERY DAY BEFORE BREAKFAST   hydrOXYzine 10 MG tablet Commonly known as:  ATARAX/VISTARIL TAKE ONE TABLET BY MOUTH 3 TIMES DAILY AS NEEDED FOR ITCHING   hydrOXYzine 10 MG tablet Commonly known as:  ATARAX/VISTARIL TAKE ONE TABLET 3 TIMES DAILY AS NEEDED FOR ITCHING   isosorbide mononitrate 60 MG 24 hr tablet Commonly known as:  IMDUR Take 1 tablet (60 mg total) by mouth as directed. Take 1 tablet at bedtime and 1/2 tablet in the morning   losartan 50 MG tablet Commonly known as:  COZAAR Take 1 tablet (50 mg total) by mouth daily.   metFORMIN 500 MG 24 hr tablet Commonly known as:  GLUCOPHAGE-XR Take 2 tablets (1,000 mg total) by mouth 2 (two) times daily.   metoprolol succinate 50 MG 24 hr tablet Commonly known as:  TOPROL-XL TAKE 1 TABLET BY MOUTH DAILY WITH OR IMMEDIATLEY FOLLOWING A MEAL   MYRBETRIQ 25 MG Tb24 tablet Generic drug:  mirabegron ER TAKE ONE TABLET BY MOUTH EVERY DAY   nitrofurantoin (macrocrystal-monohydrate) 100 MG capsule Commonly known as:  MACROBID Take 1 capsule (100 mg total) by mouth 2 (two) times daily.   nitroGLYCERIN 0.4 MG SL tablet Commonly known as:  NITROSTAT Place 1 tablet (0.4 mg total) under the tongue every 5 (five) minutes as needed for chest pain.   pantoprazole 40 MG tablet Commonly known as:  PROTONIX TAKE ONE TABLET BY MOUTH DAILY AS NEEDED   rosuvastatin 40 MG tablet Commonly known as:  CRESTOR Take 1 tablet (40 mg total)  by mouth daily.   SYNTHROID 125 MCG tablet Generic drug:  levothyroxine Take 125 mcg by mouth daily.   trimethoprim 100 MG tablet Commonly known as:  TRIMPEX Take 1 tablet (100 mg total) by mouth daily.       Allergies:  Allergies  Allergen Reactions  . Hydrocodone Other (See Comments)    Unresponsive  . Aleve [Naproxen Sodium]     Hives & itching   . Contrast Media [Iodinated Diagnostic Agents]     Pt avoids due to kidney  issues  . Ioxaglate Other (See Comments)    (iodine) Pt avoids due to kidney issues   . Oxycodone Other (See Comments)    hallucinations  . Ranexa [Ranolazine]     Sick on stomach  . Sulfa Antibiotics Other (See Comments)    Pt avoids due to kidney issues  . Tramadol Other (See Comments)    nightmares    Family History: Family History  Problem Relation Age of Onset  . Hypertension Father   . Heart disease Father   . Heart attack Father   . Diabetes Father   . Cancer Mother        colon  . Heart attack Brother   . Diabetes Brother   . Hypertension Brother   . Heart disease Brother   . Heart attack Brother   . Diabetes Brother   . Hypertension Brother   . Heart disease Brother   . Heart attack Brother   . Diabetes Brother   . Hypertension Brother   . Heart disease Brother   . Cancer Sister        lung  . COPD Sister   . COPD Sister     Social History:  reports that she quit smoking about 8 years ago. Her smoking use included cigarettes. She has a 7.50 pack-year smoking history. She has never used smokeless tobacco. She reports that she does not drink alcohol or use drugs.  ROS: UROLOGY Frequent Urination?: Yes Hard to postpone urination?: Yes Burning/pain with urination?: Yes Get up at night to urinate?: Yes Leakage of urine?: Yes Urine stream starts and stops?: Yes Trouble starting stream?: No Do you have to strain to urinate?: No Blood in urine?: No Urinary tract infection?: Yes Sexually transmitted disease?: No Injury  to kidneys or bladder?: No Painful intercourse?: No Weak stream?: No Currently pregnant?: No Vaginal bleeding?: No Last menstrual period?: n  Gastrointestinal Nausea?: No Vomiting?: No Indigestion/heartburn?: No Diarrhea?: No Constipation?: No  Constitutional Fever: No Night sweats?: No Weight loss?: No Fatigue?: Yes  Skin Skin rash/lesions?: No Itching?: No  Eyes Blurred vision?: No Double vision?: No  Ears/Nose/Throat Sore throat?: No Sinus problems?: Yes  Hematologic/Lymphatic Swollen glands?: No Easy bruising?: No  Cardiovascular Leg swelling?: No Chest pain?: No  Respiratory Cough?: Yes Shortness of breath?: Yes  Endocrine Excessive thirst?: Yes  Musculoskeletal Back pain?: Yes Joint pain?: No  Neurological Headaches?: Yes Dizziness?: No  Psychologic Depression?: No Anxiety?: No  Physical Exam: BP 107/67 (BP Location: Right Arm, Patient Position: Sitting, Cuff Size: Normal)   Pulse 86   Ht '5\' 6"'$  (1.676 m)   Wt 219 lb 12.8 oz (99.7 kg)   BMI 35.48 kg/m   Constitutional:  Alert and oriented, No acute distress. HEENT: [place] AT, moist mucus membranes.  Trachea midline, no masses. Cardiovascular: No clubbing, cyanosis, or edema. Respiratory: Normal respiratory effort, no increased work of breathing. GI: Abdomen is soft, nontender, nondistended, no abdominal masses GU: No CVA tenderness Lymph: No cervical or inguinal lymphadenopathy. Skin: No rashes, bruises or suspicious lesions. Neurologic: Grossly intact, no focal deficits, moving all 4 extremities. Psychiatric: Normal mood and affect.  Laboratory Data:  Urinalysis Dipstick 3+ glucose/3+ blood/3+ leukocytes Microscopy >30 wbc, 11-30 RBC   Assessment & Plan:   72 year old female with irritative voiding symptoms and pyuria.  Most recent urine cultures have been negative.  Repeat urine culture was ordered.  She will return for cystoscopy.    [name], [facility] [address]  [address], [place], [place] [address] [phone]

## 2017-08-25 LAB — CULTURE, URINE COMPREHENSIVE

## 2017-08-26 ENCOUNTER — Encounter: Payer: Self-pay | Admitting: Urology

## 2017-08-28 ENCOUNTER — Telehealth: Payer: Self-pay

## 2017-08-28 NOTE — Telephone Encounter (Signed)
Pt informed

## 2017-08-28 NOTE — Telephone Encounter (Signed)
-----   Message from [name], MD sent at 08/26/2017 11:24 AM EDT ----- Urine culture showed no evidence of infection.  Keep follow-up appointment as scheduled.

## 2017-09-11 ENCOUNTER — Other Ambulatory Visit: Payer: Self-pay | Admitting: Family Medicine

## 2017-09-27 ENCOUNTER — Encounter: Payer: Self-pay | Admitting: Urology

## 2017-09-27 ENCOUNTER — Ambulatory Visit: Payer: Medicare Other | Admitting: Urology

## 2017-09-27 ENCOUNTER — Encounter

## 2017-09-27 ENCOUNTER — Other Ambulatory Visit: Payer: Self-pay

## 2017-09-27 VITALS — BP 152/85 | HR 85 | Resp 16 | Ht 66.0 in | Wt 218.7 lb

## 2017-09-27 DIAGNOSIS — R300 Dysuria: Secondary | ICD-10-CM

## 2017-09-27 LAB — URINALYSIS, COMPLETE
Bilirubin, UA: NEGATIVE
Glucose, UA: NEGATIVE
Ketones, UA: NEGATIVE
Nitrite, UA: NEGATIVE
Specific Gravity, UA: 1.02 (ref 1.005–1.030)
Urobilinogen, Ur: 0.2 mg/dL (ref 0.2–1.0)
pH, UA: 6 (ref 5.0–7.5)

## 2017-09-27 LAB — MICROSCOPIC EXAMINATION: WBC, UA: >30 /hpf — AB (ref 0–5)

## 2017-09-27 MED ORDER — MIRABEGRON ER 50 MG PO TB24: 50.0000 mg | ORAL_TABLET | Freq: Every day | ORAL | 11 refills | Status: DC

## 2017-09-27 NOTE — Addendum Note (Signed)
Addended by: Garnette Gunner on: 09/27/2017 03:58 PM   Modules accepted: Orders

## 2017-09-27 NOTE — Progress Notes (Signed)
Scheduled for cystoscopy today however urinalysis shows significant pyuria at > 30 WBC.  Urine was cultured.  Cystoscopy will be rescheduled.

## 2017-09-28 ENCOUNTER — Ambulatory Visit: Payer: Medicare Other | Admitting: Urology

## 2017-10-01 ENCOUNTER — Telehealth: Payer: Self-pay | Admitting: Urology

## 2017-10-01 LAB — CULTURE, URINE COMPREHENSIVE

## 2017-10-01 NOTE — Telephone Encounter (Signed)
Pt called asking about lab results.  Please give pt a call.

## 2017-10-01 NOTE — Telephone Encounter (Signed)
Patient notified that urine culture was negative and to keep rescheduled cysto apt

## 2017-10-02 ENCOUNTER — Telehealth: Payer: Self-pay | Admitting: Family Medicine

## 2017-10-02 NOTE — Telephone Encounter (Signed)
-----   Message from [name], MD sent at 10/02/2017  7:24 AM EDT ----- Urine culture did not show infection.

## 2017-10-02 NOTE — Telephone Encounter (Signed)
Patient notified and voiced understanding.

## 2017-10-05 ENCOUNTER — Other Ambulatory Visit (INDEPENDENT_AMBULATORY_CARE_PROVIDER_SITE_OTHER): Payer: Self-pay | Admitting: Vascular Surgery

## 2017-10-05 ENCOUNTER — Ambulatory Visit (INDEPENDENT_AMBULATORY_CARE_PROVIDER_SITE_OTHER): Payer: Medicare Other | Admitting: Vascular Surgery

## 2017-10-05 ENCOUNTER — Encounter (INDEPENDENT_AMBULATORY_CARE_PROVIDER_SITE_OTHER): Payer: Self-pay | Admitting: Vascular Surgery

## 2017-10-05 ENCOUNTER — Ambulatory Visit (INDEPENDENT_AMBULATORY_CARE_PROVIDER_SITE_OTHER): Payer: Medicare Other

## 2017-10-05 VITALS — BP 128/69 | HR 70 | Resp 15 | Ht 66.0 in | Wt 219.0 lb

## 2017-10-05 DIAGNOSIS — E118 Type 2 diabetes mellitus with unspecified complications: Secondary | ICD-10-CM | POA: Diagnosis not present

## 2017-10-05 DIAGNOSIS — Z9862 Peripheral vascular angioplasty status: Secondary | ICD-10-CM

## 2017-10-05 DIAGNOSIS — I25118 Atherosclerotic heart disease of native coronary artery with other forms of angina pectoris: Secondary | ICD-10-CM

## 2017-10-05 DIAGNOSIS — I739 Peripheral vascular disease, unspecified: Secondary | ICD-10-CM

## 2017-10-05 DIAGNOSIS — IMO0002 Reserved for concepts with insufficient information to code with codable children: Secondary | ICD-10-CM

## 2017-10-05 DIAGNOSIS — E782 Mixed hyperlipidemia: Secondary | ICD-10-CM

## 2017-10-05 DIAGNOSIS — E1165 Type 2 diabetes mellitus with hyperglycemia: Secondary | ICD-10-CM | POA: Diagnosis not present

## 2017-10-05 NOTE — Assessment & Plan Note (Signed)
Her ABIs today are 1.0 on the right and down a little bit to 0.75 on the left.  No new or worrisome symptoms.  Her mobility has been hampered more from her back surgery than her legs at this point.  We will continue the current medical regimen and I will plan to recheck her ABIs as well as a left lower extremity arterial duplex in 1 year unless worsening symptoms develop in the interim.

## 2017-10-05 NOTE — Assessment & Plan Note (Signed)
lipid control important in reducing the progression of atherosclerotic disease. Continue statin therapy  

## 2017-10-05 NOTE — Assessment & Plan Note (Signed)
blood glucose control important in reducing the progression of atherosclerotic disease. Also, involved in wound healing. On appropriate medications.  

## 2017-10-05 NOTE — Progress Notes (Signed)
MRN : [id number]  Tammy [name] is a 72 y.o. ([birth year]) female who presents with chief complaint of  Chief Complaint  Patient presents with  . Follow-up    1 year [place] f/u  .  History of Present Illness: Patient returns today in follow up of PAD.  She is still little slowed by her back surgery and the recovery thereof.  She is walking with a walker today.  Overall, she says her legs are doing okay.  Cymbalta has helped the neuropathic pain in her legs.  No new ulcerations or infection. Her ABIs today are 1.0 on the right and down a little bit to 0.75 on the left.  Current Outpatient Medications  Medication Sig Dispense Refill  . albuterol (PROAIR HFA) 108 (90 Base) MCG/ACT inhaler INHALE 2 PUFFS EVERY 4 HOURS AS NEEDED FOR WHEEZING OR SHORTNESS OF BREATH 8.5 g 3  . apixaban (ELIQUIS) 5 MG TABS tablet Take 1 tablet (5 mg total) by mouth 2 (two) times daily. 180 tablet 3  . blood glucose meter kit and supplies KIT Dispense based on patient and insurance preference. Use one time daily as directed, E11.9 1 each 0  . DULoxetine (CYMBALTA) 60 MG capsule Take 1 capsule (60 mg total) by mouth daily. 30 capsule 12  . gabapentin (NEURONTIN) 300 MG capsule TAKE 1 CAPSULE 3 TIMES DAILY 270 capsule 3  . glipiZIDE (GLUCOTROL) 5 MG tablet TAKE ONE TABLET EVERY DAY BEFORE BREAKFAST 90 tablet 1  . hydrOXYzine (ATARAX/VISTARIL) 10 MG tablet TAKE ONE TABLET BY MOUTH 3 TIMES DAILY AS NEEDED FOR ITCHING 270 tablet 1  . hydrOXYzine (ATARAX/VISTARIL) 10 MG tablet TAKE ONE TABLET 3 TIMES DAILY AS NEEDED FOR ITCHING 270 tablet 1  . isosorbide mononitrate (IMDUR) 60 MG 24 hr tablet Take 1 tablet (60 mg total) by mouth as directed. Take 1 tablet at bedtime and 1/2 tablet in the morning 135 tablet 3  . losartan (COZAAR) 50 MG tablet Take 1 tablet (50 mg total) by mouth daily. 90 tablet 3  . metFORMIN (GLUCOPHAGE-XR) 500 MG 24 hr tablet Take 2 tablets (1,000 mg total) by mouth 2 (two) times daily. 360 tablet 1  .  metoprolol succinate (TOPROL-XL) 50 MG 24 hr tablet TAKE 1 TABLET BY MOUTH DAILY WITH OR IMMEDIATLEY FOLLOWING A MEAL 90 tablet 3  . mirabegron ER (MYRBETRIQ) 50 MG TB24 tablet Take 1 tablet (50 mg total) by mouth daily. 30 tablet 11  . nitrofurantoin, macrocrystal-monohydrate, (MACROBID) 100 MG capsule Take 1 capsule (100 mg total) by mouth 2 (two) times daily. 14 capsule 0  . nitroGLYCERIN (NITROSTAT) 0.4 MG SL tablet Place 1 tablet (0.4 mg total) under the tongue every 5 (five) minutes as needed for chest pain. 25 tablet 6  . pantoprazole (PROTONIX) 40 MG tablet TAKE ONE TABLET EVERY DAY AS NEEDED 90 tablet 1  . rosuvastatin (CRESTOR) 40 MG tablet Take 1 tablet (40 mg total) by mouth daily. 90 tablet 3  . SYNTHROID 125 MCG tablet Take 125 mcg by mouth daily.    [name] trimethoprim (TRIMPEX) 100 MG tablet Take 1 tablet (100 mg total) by mouth daily. 30 tablet 6   No current facility-administered medications for this visit.     Past Medical History:  Diagnosis Date  . Asthma   . Benign neoplasm of colon   . Cervical disc disease    "bulging" - no limitations per pt  . Chronic diastolic CHF (congestive heart failure) ([place])    a. 07/2012  Echo: Nl EF. mild inferoseptal HK, Gr 2 DD, mild conc LVH, mild PR/TR, mild PAH.  . CKD (chronic kidney disease), stage III ([place])   . COPD (chronic obstructive pulmonary disease) ([place])   . Coronary artery disease    a. 11/2011 Cath: LAD 65md, LCX min irregs, RCA 70p, 1031mith L->R collats, EF 60%-->Med Rx.  . Diabetes mellitus without complication ([facility]  . Fusion of lumbar spine 12/22/2015  . GERD (gastroesophageal reflux disease)   . History of DVT (deep vein thrombosis)   . History of tobacco abuse    a. Quit 2011.  . Marland Kitchenyperlipidemia   . Hypertension   . Hypothyroidism   . Obesity   . Palpitations   . PE (pulmonary embolism) 10/15  . PONV (postoperative nausea and vomiting)   . PVD (peripheral vascular disease) ([facility]   a. PTA of right leg and left  femoral artery with stenosis.  . Stroke ([facility]   TIAs - no deficits    Past Surgical History:  Procedure Laterality Date  . ANGIOPLASTY / STENTING FEMORAL     PVD; angioplasty right leg and left femoral artery with stenosis.   . APPENDECTOMY  Oct. 2016  . BACK SURGERY  03/2012  . BACK SURGERY     screws, rods replaced with new hardware.  . Marland KitchenACK SURGERY  11/2016  . CARDIAC CATHETERIZATION  12/07/2011   Mid LAD 50%, distal LAD 50%, mid RCA 70%, distal RCA 100%   . CARDIAC CATHETERIZATION  01/04/2011   100% occluded mid RCA with good collaterals from distal LAD, normal LVEF.  . Marland KitchenHOLECYSTECTOMY    . COLONOSCOPY  2013  . COLONOSCOPY WITH PROPOFOL N/A 05/24/2015   Procedure: COLONOSCOPY WITH PROPOFOL;  Surgeon: DaLucilla LameMD;  [place];  Laterality: N/A;  Diabetic - oral meds  . LAPAROSCOPIC APPENDECTOMY N/A 01/06/2015   Procedure: APPENDECTOMY LAPAROSCOPIC;  Surgeon: SeChristene LyeMD;  Location: [facility];  Service: General;  Laterality: N/A;  . POLYPECTOMY  05/24/2015   Procedure: POLYPECTOMY;  Surgeon: DaLucilla LameMD;  [place] [facility]: [place];;  . THYROIDECTOMY    . TRIGGER FINGER RELEASE      Social History Social History   Tobacco Use  . Smoking status: Former Smoker    Packs/day: 0.50    Years: 15.00    Pack years: 7.50    Types: Cigarettes    Last attempt to quit: 03/13/2009    Years since quitting: 8.5  . Smokeless tobacco: Never Used  Substance Use Topics  . Alcohol use: No    Alcohol/week: 0.0 oz  . Drug use: No     Family History Family History  Problem Relation Age of Onset  . Hypertension Father   . Heart disease Father   . Heart attack Father   . Diabetes Father   . Cancer Mother        colon  . Heart attack Brother   . Diabetes Brother   . Hypertension Brother   . Heart disease Brother   . Heart attack Brother   . Diabetes Brother   . Hypertension Brother   . Heart  disease Brother   . Heart attack Brother   . Diabetes Brother   . Hypertension Brother   . Heart disease Brother   . Cancer Sister        lung  . COPD Sister   . COPD Sister      Allergies  Allergen Reactions  . Hydrocodone Other (See Comments)    Unresponsive  . Aleve [Naproxen Sodium]     Hives & itching   . Contrast Media [Iodinated Diagnostic Agents]     Pt avoids due to kidney issues  . Ioxaglate Other (See Comments)    (iodine) Pt avoids due to kidney issues   . Oxycodone Other (See Comments)    hallucinations  . Ranexa [Ranolazine]     Sick on stomach  . Sulfa Antibiotics Other (See Comments)    Pt avoids due to kidney issues  . Tramadol Other (See Comments)    nightmares     REVIEW OF SYSTEMS (Negative unless checked)  Constitutional: [] Weight loss  [] Fever  [] Chills Cardiac: [] Chest pain   [] Chest pressure   [x] Palpitations   [] Shortness of breath when laying flat   [] Shortness of breath at rest   [] Shortness of breath with exertion. Vascular:  [] Pain in legs with walking   [] Pain in legs at rest   [] Pain in legs when laying flat   [] Claudication   [] Pain in feet when walking  [] Pain in feet at rest  [] Pain in feet when laying flat   [x] History of DVT   [x] Phlebitis   [x] Swelling in legs   [] Varicose veins   [] Non-healing ulcers Pulmonary:   [] Uses home oxygen   [] Productive cough   [] Hemoptysis   [] Wheeze  [x] COPD   [] Asthma Neurologic:  [] Dizziness  [] Blackouts   [] Seizures   [x] History of stroke   [] History of TIA  [] Aphasia   [] Temporary blindness   [] Dysphagia   [] Weakness or numbness in arms   [x] Weakness or numbness in legs Musculoskeletal:  [x] Arthritis   [] Joint swelling   [x] Joint pain   [x] Low back pain Hematologic:  [] Easy bruising  [] Easy bleeding   [] Hypercoagulable state   [] Anemic   Gastrointestinal:  [] Blood in stool   [] Vomiting blood  [x] Gastroesophageal reflux/heartburn   [] Abdominal pain Genitourinary:  [] Chronic kidney disease   [] Difficult  urination  [] Frequent urination  [] Burning with urination   [] Hematuria Skin:  [] Rashes   [] Ulcers   [] Wounds Psychological:  [] History of anxiety   []  History of major depression.  Physical Examination  BP 128/69 (BP Location: Right Arm, Patient Position: Sitting)   Pulse 70   Resp 15   Ht 5' 6"  (1.676 m)   Wt 219 lb (99.3 kg)   BMI 35.35 kg/m  Gen:  WD/WN, NAD Head: [place]/AT, No temporalis wasting. Ear/Nose/Throat: Hearing grossly intact, nares w/o erythema or drainage Eyes: Conjunctiva clear. Sclera non-icteric Neck: Supple.  Trachea midline Pulmonary:  Good air movement, no use of accessory muscles.  Cardiac: RRR, no JVD Vascular:  Vessel Right Left  Radial Palpable Palpable                          PT  1+ palpable  1+ palpable  DP Palpable  1+ palpable    Musculoskeletal: M/S 5/5 throughout.  No deformity or atrophy.  Mild bilateral lower extremity edema.  Walking with a walker Neurologic: Sensation grossly intact in extremities.  Symmetrical.  Speech is fluent.  Psychiatric: Judgment intact, Mood & affect appropriate for pt's clinical situation. Dermatologic: No rashes or ulcers noted.  No cellulitis or open wounds.       Labs Recent Results (from the past 2160 hour(s))  UA/M w/rflx Culture, Routine     Status: Abnormal   Collection Time: 07/20/17 10:14 AM  Result Value Ref Range  Specific Gravity, UA 1.020 1.005 - 1.030   pH, UA 6.0 5.0 - 7.5   Color, UA Yellow Yellow   Appearance Ur Turbid (A) Clear   Leukocytes, UA 3+ (A) Negative   Protein, UA 1+ (A) Negative/Trace   Ketones, UA Negative Negative   RBC, UA 4+ (A) Negative   Bilirubin, UA Negative Negative   Urobilinogen, Ur 0.2 0.2 - 1.0 mg/dL   Nitrite, UA Negative Negative   Microscopic Examination See below:    Urinalysis Reflex Comment     Comment: This specimen has reflexed to a Urine Culture.  Microscopic Examination     Status: Abnormal   Collection Time: 07/20/17 10:14 AM  Result Value  Ref Range   WBC, UA >30 (A) 0 - 5 /hpf   RBC, UA 3-10 (A) 0 - 2 /hpf   Epithelial Cells (non renal) 0-10 0 - 10 /hpf   Bacteria, UA Moderate (A) None seen/Few   Yeast, UA Present None seen  Urine Culture, Reflex     Status: None   Collection Time: 07/20/17 10:14 AM  Result Value Ref Range   Urine Culture, Routine Final report    Organism ID, Bacteria Comment     Comment: Mixed urogenital flora 10,000-25,000 colony forming units per mL   UA/M w/rflx Culture, Routine     Status: Abnormal   Collection Time: 08/01/17  1:55 PM  Result Value Ref Range   Specific Gravity, UA 1.020 1.005 - 1.030   pH, UA 6.0 5.0 - 7.5   Color, UA Straw Yellow   Appearance Ur Turbid (A) Clear   Leukocytes, UA 3+ (A) Negative   Protein, UA 2+ (A) Negative/Trace   Ketones, UA Negative Negative   RBC, UA 3+ (A) Negative   Bilirubin, UA Negative Negative   Urobilinogen, Ur 0.2 0.2 - 1.0 mg/dL   Nitrite, UA Negative Negative   Microscopic Examination See below:    Urinalysis Reflex Comment     Comment: This specimen has reflexed to a Urine Culture.  Microscopic Examination     Status: Abnormal   Collection Time: 08/01/17  1:55 PM  Result Value Ref Range   WBC, UA >30 (A) 0 - 5 /hpf   RBC, UA 3-10 (A) 0 - 2 /hpf   Epithelial Cells (non renal) 0-10 0 - 10 /hpf   Bacteria, UA Many (A) None seen/Few   Yeast, UA Present None seen  Urine Culture, Reflex     Status: None   Collection Time: 08/01/17  1:55 PM  Result Value Ref Range   Urine Culture, Routine Final report    Organism ID, Bacteria Comment     Comment: Mixed urogenital flora Less than 10,000 colonies/mL   CBC with Differential/Platelet     Status: Abnormal   Collection Time: 08/01/17  2:40 PM  Result Value Ref Range   WBC 10.1 3.4 - 10.8 x10E3/uL   RBC 4.67 3.77 - 5.28 x10E6/uL   Hemoglobin 13.4 11.1 - 15.9 g/dL   Hematocrit 41.8 34.0 - 46.6 %   MCV 90 79 - 97 fL   MCH 28.7 26.6 - 33.0 pg   MCHC 32.1 31.5 - 35.7 g/dL   RDW 13.3 12.3 -  15.4 %   Platelets 297 150 - 450 x10E3/uL    Comment:               **Please note reference interval change**   Neutrophils 56 Not Estab. %   Lymphs 29 Not Estab. %  Monocytes 10 Not Estab. %   Eos 3 Not Estab. %   Basos 1 Not Estab. %   Neutrophils Absolute 5.9 1.4 - 7.0 x10E3/uL   Lymphocytes Absolute 2.9 0.7 - 3.1 x10E3/uL   Monocytes Absolute 1.0 (H) 0.1 - 0.9 x10E3/uL   EOS (ABSOLUTE) 0.3 0.0 - 0.4 x10E3/uL   Basophils Absolute 0.1 0.0 - 0.2 x10E3/uL   Immature Granulocytes 1 Not Estab. %   Immature Grans (Abs) 0.1 0.0 - 0.1 x10E3/uL  Comprehensive metabolic panel     Status: Abnormal   Collection Time: 08/01/17  2:40 PM  Result Value Ref Range   Glucose 233 (H) 65 - 99 mg/dL   BUN 24 8 - 27 mg/dL   Creatinine, Ser 1.73 (H) 0.57 - 1.00 mg/dL   GFR calc non Af Amer 29 (L) >59 mL/min/1.73   GFR calc Af Amer 33 (L) >59 mL/min/1.73   BUN/Creatinine Ratio 14 12 - 28   Sodium 136 134 - 144 mmol/L   Potassium 5.6 (H) 3.5 - 5.2 mmol/L   Chloride 95 (L) 96 - 106 mmol/L   CO2 25 20 - 29 mmol/L   Calcium 9.7 8.7 - 10.3 mg/dL   Total Protein 7.0 6.0 - 8.5 g/dL   Albumin 4.2 3.5 - 4.8 g/dL   Globulin, Total 2.8 1.5 - 4.5 g/dL   Albumin/Globulin Ratio 1.5 1.2 - 2.2   Bilirubin Total 0.4 0.0 - 1.2 mg/dL   Alkaline Phosphatase 94 39 - 117 IU/L   AST 11 0 - 40 IU/L   ALT 10 0 - 32 IU/L  UA/M w/rflx Culture, Routine     Status: Abnormal   Collection Time: 08/13/17 10:53 AM  Result Value Ref Range   Specific Gravity, UA 1.015 1.005 - 1.030   pH, UA 6.0 5.0 - 7.5   Color, UA Yellow Yellow   Appearance Ur Clear Clear   Leukocytes, UA 3+ (A) Negative   Protein, UA 2+ (A) Negative/Trace   Glucose, UA Negative Negative   Ketones, UA Negative Negative   RBC, UA 3+ (A) Negative   Bilirubin, UA Negative Negative   Urobilinogen, Ur 0.2 0.2 - 1.0 mg/dL   Nitrite, UA Negative Negative   Microscopic Examination See below:   Microscopic Examination     Status: Abnormal   Collection Time:  08/13/17 10:53 AM  Result Value Ref Range   WBC, UA >30 (H) 0 - 5 /hpf   RBC, UA None seen 0 - 2 /hpf   Epithelial Cells (non renal) None seen 0 - 10 /hpf   Bacteria, UA Many (A) None seen/Few   Yeast, UA Present (A) None seen  Urine Culture, Reflex     Status: None (Preliminary result)   Collection Time: 08/13/17 10:53 AM  Result Value Ref Range   Urine Culture, Routine WILL FOLLOW   Urine Culture     Status: None   Collection Time: 08/13/17 11:03 AM  Result Value Ref Range   Urine Culture, Routine Final report    Organism ID, Bacteria Comment     Comment: Culture shows less than 10,000 colony forming units of bacteria per milliliter of urine. This colony count is not generally considered to be clinically significant.   Urinalysis, Complete     Status: Abnormal   Collection Time: 08/23/17 12:02 PM  Result Value Ref Range   Specific Gravity, UA 1.015 1.005 - 1.030   pH, UA 5.5 5.0 - 7.5   Color, UA Yellow Yellow   Appearance  Ur Cloudy (A) Clear   Leukocytes, UA 3+ (A) Negative   Protein, UA 2+ (A) Negative/Trace   Glucose, UA 3+ (A) Negative   Ketones, UA Negative Negative   RBC, UA 3+ (A) Negative   Bilirubin, UA Negative Negative   Urobilinogen, Ur 0.2 0.2 - 1.0 mg/dL   Nitrite, UA Negative Negative   Microscopic Examination See below:   Microscopic Examination     Status: Abnormal   Collection Time: 08/23/17 12:02 PM  Result Value Ref Range   WBC, UA >30 (H) 0 - 5 /hpf   RBC, UA 11-30 (A) 0 - 2 /hpf   Epithelial Cells (non renal) 0-10 0 - 10 /hpf   Mucus, UA Present (A) Not Estab.   Bacteria, UA Many (A) None seen/Few   Yeast, UA Present (A) None seen  CULTURE, URINE COMPREHENSIVE     Status: None   Collection Time: 08/23/17 12:43 PM  Result Value Ref Range   Urine Culture, Comprehensive Final report    Organism ID, Bacteria Comment     Comment: Mixed urogenital flora 10,000-25,000 colony forming units per mL   Urinalysis, Complete     Status: Abnormal    Collection Time: 09/27/17  3:11 PM  Result Value Ref Range   Specific Gravity, UA 1.020 1.005 - 1.030   pH, UA 6.0 5.0 - 7.5   Color, UA Yellow Yellow   Appearance Ur Turbid (A) Clear   Leukocytes, UA 3+ (A) Negative   Protein, UA 2+ (A) Negative/Trace   Glucose, UA Negative Negative   Ketones, UA Negative Negative   RBC, UA 2+ (A) Negative   Bilirubin, UA Negative Negative   Urobilinogen, Ur 0.2 0.2 - 1.0 mg/dL   Nitrite, UA Negative Negative   Microscopic Examination See below:   Microscopic Examination     Status: Abnormal   Collection Time: 09/27/17  3:11 PM  Result Value Ref Range   WBC, UA >30 (A) 0 - 5 /hpf   RBC, UA 11-30 (A) 0 - 2 /hpf   Epithelial Cells (non renal) CANCELED     Comment: Test not performed  Result canceled by the ancillary.    Bacteria, UA Many (A) None seen/Few  CULTURE, URINE COMPREHENSIVE     Status: None   Collection Time: 09/27/17  4:09 PM  Result Value Ref Range   Urine Culture, Comprehensive Final report    Organism ID, Bacteria Comment     Comment: Mixed urogenital flora 10,000-25,000 colony forming units per mL     Radiology No results found.  Assessment/Plan  Hyperlipidemia lipid control important in reducing the progression of atherosclerotic disease. Continue statin therapy   Diabetes mellitus type 2 with complications, uncontrolled blood glucose control important in reducing the progression of atherosclerotic disease. Also, involved in wound healing. On appropriate medications.   PAD (peripheral artery disease) Her ABIs today are 1.0 on the right and down a little bit to 0.75 on the left.  No new or worrisome symptoms.  Her mobility has been hampered more from her back surgery than her legs at this point.  We will continue the current medical regimen and I will plan to recheck her ABIs as well as a left lower extremity arterial duplex in 1 year unless worsening symptoms develop in the interim.    Leotis Pain,  MD  10/05/2017 10:24 AM    This note was created with Dragon medical transcription system.  Any errors from dictation are purely unintentional

## 2017-10-05 NOTE — Patient Instructions (Signed)

## 2017-10-19 ENCOUNTER — Other Ambulatory Visit: Payer: Medicare Other | Admitting: Urology

## 2017-10-31 ENCOUNTER — Ambulatory Visit (INDEPENDENT_AMBULATORY_CARE_PROVIDER_SITE_OTHER): Payer: Medicare Other

## 2017-10-31 VITALS — BP 124/72 | HR 69 | Temp 98.3°F | Resp 16 | Ht 62.0 in | Wt 211.7 lb

## 2017-10-31 DIAGNOSIS — Z0000 Encounter for general adult medical examination without abnormal findings: Secondary | ICD-10-CM

## 2017-10-31 NOTE — Progress Notes (Signed)
Subjective:   Tammy [name] is a 72 y.o. female who presents for Medicare Annual (Subsequent) preventive examination.  Review of Systems:  Cardiac Risk Factors include: advanced age (>50mn, >>87women);hypertension;dyslipidemia;smoking/ tobacco exposure;obesity (BMI >30kg/m2);diabetes mellitus     Objective:     Vitals: BP 124/72 (BP Location: Left Arm, Patient Position: Sitting)   Pulse 69   Temp 98.3 F (36.8 C) (Temporal)   Resp 16   Ht 5' 2"  (1.575 m)   Wt 211 lb 11.2 oz (96 kg)   BMI 38.72 kg/m   Body mass index is 38.72 kg/m.  Advanced Directives 10/25/2016 12/10/2015 11/25/2015 11/22/2015 10/01/2015 06/24/2015 05/24/2015  Does Patient Have a Medical Advance Directive? No No - No No No No  Would patient like information on creating a medical advance directive? Yes (MAU/Ambulatory/Procedural Areas - Information given) No - patient declined information No - patient declined information No - patient declined information No - patient declined information - No - patient declined information    Tobacco Social History   Tobacco Use  Smoking Status Former Smoker  . Packs/day: 0.50  . Years: 15.00  . Pack years: 7.50  . Types: Cigarettes  . Last attempt to quit: 03/13/2009  . Years since quitting: 8.6  Smokeless Tobacco Never Used     Counseling given: Not Answered   Clinical Intake:  Pre-visit preparation completed: Yes  Pain : No/denies pain     Nutritional Status: BMI > 30  Obese Nutritional Risks: None Diabetes: Yes CBG done?: No Did pt. bring in CBG monitor from home?: No  How often do you need to have someone help you when you read instructions, pamphlets, or other written materials from your doctor or pharmacy?: 1 - Never What is the last grade level you completed in school?: technical college - interior design   Interpreter Needed?: No     Past Medical History:  Diagnosis Date  . Asthma   . Benign neoplasm of colon   . Cervical disc disease    "bulging" - no limitations per pt  . Chronic diastolic CHF (congestive heart failure) ([place]    a. 07/2012 Echo: Nl EF. mild inferoseptal HK, Gr 2 DD, mild conc LVH, mild PR/TR, mild PAH.  . CKD (chronic kidney disease), stage III ([place]   . COPD (chronic obstructive pulmonary disease) ([place]   . Coronary artery disease    a. 11/2011 Cath: LAD 530m, LCX min irregs, RCA 70p, 10076mth L->R collats, EF 60%-->Med Rx.  . Diabetes mellitus without complication ([place] . Fusion of lumbar spine 12/22/2015  . GERD (gastroesophageal reflux disease)   . History of DVT (deep vein thrombosis)   . History of tobacco abuse    a. Quit 2011.  . HMarland Kitchenperlipidemia   . Hypertension   . Hypothyroidism   . Obesity   . Palpitations   . PE (pulmonary embolism) 10/15  . PONV (postoperative nausea and vomiting)   . PVD [place]peripheral vascular disease) [place]  a. PTA of right leg and left femoral artery with stenosis.  . [place]  TIAs - no deficits   Past Surgical History:  Procedure Laterality Date  . ANGIOPLASTY / STENTING FEMORAL     PVD; angioplasty right leg and left femoral artery with stenosis.   . APPENDECTOMY  Oct. 2016  . BACK SURGERY  03/2012  . BACK SURGERY     screws, rods replaced with new hardware.  . BMarland KitchenCK SURGERY  11/2016  .  CARDIAC CATHETERIZATION  12/07/2011   Mid LAD 50%, distal LAD 50%, mid RCA 70%, distal RCA 100%   . CARDIAC CATHETERIZATION  01/04/2011   100% occluded mid RCA with good collaterals from distal LAD, normal LVEF.  [name] CHOLECYSTECTOMY    . COLONOSCOPY  2013  . COLONOSCOPY WITH PROPOFOL N/A 05/24/2015   Procedure: COLONOSCOPY WITH PROPOFOL;  Surgeon: [name], MD;  Location: [facility];  Service: Endoscopy;  Laterality: N/A;  Diabetic - oral meds  . LAPAROSCOPIC APPENDECTOMY N/A 01/06/2015   Procedure: APPENDECTOMY LAPAROSCOPIC;  Surgeon: [name], MD;  Location: [facility];  Service: General;  Laterality: N/A;  . POLYPECTOMY  05/24/2015    Procedure: POLYPECTOMY;  Surgeon: [name], MD;  Location: [facility];  Service: Endoscopy;;  . THYROIDECTOMY    . TRIGGER FINGER RELEASE     Family History  Problem Relation Age of Onset  . Hypertension Father   . Heart disease Father   . Heart attack Father   . Diabetes Father   . Cancer Mother        colon  . Heart attack Brother   . Diabetes Brother   . Hypertension Brother   . Heart disease Brother   . Heart attack Brother   . Diabetes Brother   . Hypertension Brother   . Heart disease Brother   . Heart attack Brother   . Diabetes Brother   . Hypertension Brother   . Heart disease Brother   . Cancer Sister        lung  . COPD Sister   . COPD Sister    Social History   Socioeconomic History  . Marital status: Married    Spouse name: Not on file  . Number of children: 2  . Years of education: some college, Production assistant, radio   . Highest education level: Associate degree: occupational, Hotel manager, or vocational program  Occupational History  . Occupation: Retired  Scientific laboratory technician  . Financial resource strain: Not hard at all  . Food insecurity:    Worry: Never true    Inability: Never true  . Transportation needs:    Medical: No    Non-medical: No  Tobacco Use  . Smoking status: Former Smoker    Packs/day: 0.50    Years: 15.00    Pack years: 7.50    Types: Cigarettes    Last attempt to quit: 03/13/2009    Years since quitting: 8.6  . Smokeless tobacco: Never Used  Substance and Sexual Activity  . Alcohol use: No    Alcohol/week: 0.0 standard drinks  . Drug use: No  . Sexual activity: Not on file  Lifestyle  . Physical activity:    Days per week: 0 days    Minutes per session: 0 min  . Stress: Not at all  Relationships  . Social connections:    Talks on phone: More than three times a week    Gets together: More than three times a week    Attends religious service: 1 to 4 times per year    Active member of club or  organization: No    Attends meetings of clubs or organizations: Never    Relationship status: Married  Other Topics Concern  . Not on file  Social History Narrative   Lives at home with husband.   Right-handed.   1 cup caffeine per day.    Outpatient Encounter Medications as of 10/31/2017  Medication Sig  . albuterol (PROAIR  HFA) 108 (90 Base) MCG/ACT inhaler INHALE 2 PUFFS EVERY 4 HOURS AS NEEDED FOR WHEEZING OR SHORTNESS OF BREATH  . apixaban (ELIQUIS) 5 MG TABS tablet Take 1 tablet (5 mg total) by mouth 2 (two) times daily.  . blood glucose meter kit and supplies KIT Dispense based on patient and insurance preference. Use one time daily as directed, E11.9  . DULoxetine (CYMBALTA) 60 MG capsule Take 1 capsule (60 mg total) by mouth daily.  [name] gabapentin (NEURONTIN) 300 MG capsule TAKE 1 CAPSULE 3 TIMES DAILY  . glipiZIDE (GLUCOTROL) 5 MG tablet TAKE ONE TABLET EVERY DAY BEFORE BREAKFAST  . hydrOXYzine (ATARAX/VISTARIL) 10 MG tablet TAKE ONE TABLET BY MOUTH 3 TIMES DAILY AS NEEDED FOR ITCHING  . isosorbide mononitrate (IMDUR) 60 MG 24 hr tablet Take 1 tablet (60 mg total) by mouth as directed. Take 1 tablet at bedtime and 1/2 tablet in the morning  . losartan (COZAAR) 50 MG tablet Take 1 tablet (50 mg total) by mouth daily.  . metFORMIN (GLUCOPHAGE-XR) 500 MG 24 hr tablet Take 2 tablets (1,000 mg total) by mouth 2 (two) times daily.  . metoprolol succinate (TOPROL-XL) 50 MG 24 hr tablet TAKE 1 TABLET BY MOUTH DAILY WITH OR IMMEDIATLEY FOLLOWING A MEAL  . mirabegron ER (MYRBETRIQ) 50 MG TB24 tablet Take 1 tablet (50 mg total) by mouth daily.  . pantoprazole (PROTONIX) 40 MG tablet TAKE ONE TABLET EVERY DAY AS NEEDED  . rosuvastatin (CRESTOR) 40 MG tablet Take 1 tablet (40 mg total) by mouth daily.  [name] SYNTHROID 125 MCG tablet Take 125 mcg by mouth daily.  [name] trimethoprim (TRIMPEX) 100 MG tablet Take 1 tablet (100 mg total) by mouth daily.  . hydrOXYzine (ATARAX/VISTARIL) 10 MG tablet TAKE  ONE TABLET 3 TIMES DAILY AS NEEDED FOR ITCHING  . nitrofurantoin, macrocrystal-monohydrate, (MACROBID) 100 MG capsule Take 1 capsule (100 mg total) by mouth 2 (two) times daily. (Patient not taking: Reported on 10/31/2017)  . nitroGLYCERIN (NITROSTAT) 0.4 MG SL tablet Place 1 tablet (0.4 mg total) under the tongue every 5 (five) minutes as needed for chest pain. (Patient not taking: Reported on 10/31/2017)   No facility-administered encounter medications on file as of 10/31/2017.     Activities of Daily Living In your present state of health, do you have any difficulty performing the following activities: 10/31/2017  Hearing? N  Vision? N  Difficulty concentrating or making decisions? Y  Walking or climbing stairs? Y  Dressing or bathing? Y  Comment has husband assists just in case   Doing errands, shopping? Y  Comment husband assists   Conservation officer, nature and eating ? N  Using the Toilet? N  In the past six months, have you accidently leaked urine? Y  Comment wears depends   Do you have problems with loss of bowel control? N  Managing your Medications? N  Managing your Finances? N  Housekeeping or managing your Housekeeping? Y  Comment husband assists   Some recent data might be hidden    Patient Care Team: [name], DO as PCP - General (Family Medicine) [name], [name], MD as Consulting Physician (Cardiology) [name], MD as Consulting Physician (Urology) [name], MD as Consulting Physician (Nephrology) [name], MD as Referring Physician (Orthopedic Surgery) [name], MD as Attending Physician (Endocrinology) [name], MD as Referring Physician (Vascular Surgery)    Assessment:   This is a routine wellness examination for Suellyn.  Exercise Activities and Dietary recommendations Current Exercise  Habits: The patient does not participate in regular exercise at present, Exercise limited by: None identified  Goals    . DIET - INCREASE WATER  INTAKE     Recommend drinking at least 6-8 glasses of water a day        Fall Risk Fall Risk  10/31/2017 10/25/2016 12/10/2015 10/01/2015 06/24/2015  Falls in the past year? Yes Yes Yes No No  Number falls in past yr: 1 2 or more 2 or more - -  Injury with Fall? No No No - -  Risk Factor Category  High Fall Risk - - - -  Risk for fall due to : Impaired balance/gait - - - -  Follow up - Falls prevention discussed - - -   Is the patient's home free of loose throw rugs in walkways, pet beds, electrical cords, etc?   yes      Grab bars in the bathroom? yes      Handrails on the stairs?   yes      Adequate lighting?   yes  Timed Get Up and Go performed: Completed in 10 seconds with use of assistive devices- walker, steady gait. No intervention needed at this time.   Depression Screen PHQ 2/9 Scores 10/31/2017 10/25/2016 12/10/2015 10/01/2015  PHQ - 2 Score 0 0 0 0     Cognitive Function     6CIT Screen 10/31/2017 10/25/2016  What Year? 0 points 0 points  What month? 0 points 0 points  What time? 0 points 0 points  Count back from 20 0 points 0 points  Months in reverse 0 points 0 points  Repeat phrase 0 points 0 points  Total Score 0 0    Immunization History  Administered Date(s) Administered  . Influenza, High Dose Seasonal PF 11/16/2016  . Influenza,inj,Quad PF,6+ Mos 12/16/2014, 11/24/2015  . Pneumococcal Conjugate-13 07/08/2014  . Pneumococcal Polysaccharide-23 03/13/2012  . Zoster 03/13/2010    Qualifies for Shingles Vaccine? Yes, discussed shingrix vaccine   Screening Tests Health Maintenance  Topic Date Due  . HEMOGLOBIN A1C  09/23/2017  . INFLUENZA VACCINE  10/11/2017  . FOOT EXAM  11/16/2017  . COLONOSCOPY  05/23/2020  . Hepatitis C Screening  Completed  . PNA vac Low Risk Adult  Completed  . DEXA SCAN  Addressed  . MAMMOGRAM  Discontinued  . OPHTHALMOLOGY EXAM  Discontinued  . TETANUS/TDAP  Discontinued    Cancer Screenings: Lung: Low Dose CT Chest  recommended if Age 4-80 years, 30 pack-year currently smoking OR have quit w/in 15years. Patient does not qualify. Breast:  Up to date on Mammogram? No   Up to date of Bone Density/Dexa? Yes Colorectal: completed 05/24/2015  Additional Screenings:  Hepatitis C Screening: completed 07/14/2016     Plan:    I have personally reviewed and addressed the Medicare Annual Wellness questionnaire and have noted the following in the patient's chart:  A. Medical and social history B. Use of alcohol, tobacco or illicit drugs  C. Current medications and supplements D. Functional ability and status E.  Nutritional status F.  Physical activity G. Advance directives H. List of other physicians I.  Hospitalizations, surgeries, and ER visits in previous 12 months J.  [place] such as hearing and vision if needed, cognitive and depression L. Referrals and appointments   In addition, I have reviewed and discussed with patient certain preventive protocols, quality metrics, and best practice recommendations. A written personalized care plan for preventive services as well as  general preventive health recommendations were provided to patient.   Signed,  Tyler Aas, LPN Nurse Health Advisor   Nurse Notes:    Pain in back with movement and bilateral legs, no pain today while sitting.   Patient states she has been sleeping a lot during the day and night and remains tired when she is awake, feels like she is falling asleep when she is doing stuff like drinking water.   appt with Dr.Johnson on 11/13/2017

## 2017-10-31 NOTE — Patient Instructions (Signed)
[name] , Thank you for taking time to come for your Medicare Wellness Visit. I appreciate your ongoing commitment to your health goals. Please review the following plan we discussed and let me know if I can assist you in the future.   Screening recommendations/referrals: Colonoscopy: completed 05/24/2015 Mammogram: declined today  Bone Density: completed  Recommended yearly ophthalmology/optometry visit for glaucoma screening and checkup Recommended yearly dental visit for hygiene and checkup  Vaccinations: Influenza vaccine: due 11/2017 Pneumococcal vaccine: completed series Tdap vaccine: due, check with your insurance company for coverage  Shingles vaccine: shingrix eligible, check with your insurance company for coverage   Advanced directives: Advance directive discussed with you today. I have provided a copy for you to complete at home and have notarized. Once this is complete please bring a copy in to our office so we can scan it into your chart.  Conditions/risks identified: Recommend drinking at least 6-8 glasses of water a day   Next appointment: Follow up on 11/13/2017 at 2:30pm with Tammy [name]. Follow up in one year for your annual wellness exam.    Preventive Care 65 Years and Older, Female Preventive care refers to lifestyle choices and visits with your health care provider that can promote health and wellness. What does preventive care include?  A yearly physical exam. This is also called an annual well check.  Dental exams once or twice a year.  Routine eye exams. Ask your health care provider how often you should have your eyes checked.  Personal lifestyle choices, including:  Daily care of your teeth and gums.  Regular physical activity.  Eating a healthy diet.  Avoiding tobacco and drug use.  Limiting alcohol use.  Practicing safe sex.  Taking low-dose aspirin every day.  Taking vitamin and mineral supplements as recommended by your health care  provider. What happens during an annual well check? The services and screenings done by your health care provider during your annual well check will depend on your age, overall health, lifestyle risk factors, and family history of disease. Counseling  Your health care provider may ask you questions about your:  Alcohol use.  Tobacco use.  Drug use.  Emotional well-being.  Home and relationship well-being.  Sexual activity.  Eating habits.  History of falls.  Memory and ability to understand (cognition).  Work and work Statistician.  Reproductive health. Screening  You may have the following tests or measurements:  Height, weight, and BMI.  Blood pressure.  Lipid and cholesterol levels. These may be checked every 5 years, or more frequently if you are over 62 years old.  Skin check.  Lung cancer screening. You may have this screening every year starting at age 55 if you have a 30-pack-year history of smoking and currently smoke or have quit within the past 15 years.  Fecal occult blood test (FOBT) of the stool. You may have this test every year starting at age 85.  Flexible sigmoidoscopy or colonoscopy. You may have a sigmoidoscopy every 5 years or a colonoscopy every 10 years starting at age 3.  Hepatitis C blood test.  Hepatitis B blood test.  Sexually transmitted disease (STD) testing.  Diabetes screening. This is done by checking your blood sugar (glucose) after you have not eaten for a while (fasting). You may have this done every 1-3 years.  Bone density scan. This is done to screen for osteoporosis. You may have this done starting at age 17.  Mammogram. This may be done every 1-2 years. Talk  to your health care provider about how often you should have regular mammograms. Talk with your health care provider about your test results, treatment options, and if necessary, the need for more tests. Vaccines  Your health care provider may recommend certain  vaccines, such as:  Influenza vaccine. This is recommended every year.  Tetanus, diphtheria, and acellular pertussis (Tdap, Td) vaccine. You may need a Td booster every 10 years.  Zoster vaccine. You may need this after age 66.  Pneumococcal 13-valent conjugate (PCV13) vaccine. One dose is recommended after age 71.  Pneumococcal polysaccharide (PPSV23) vaccine. One dose is recommended after age 56. Talk to your health care provider about which screenings and vaccines you need and how often you need them. This information is not intended to replace advice given to you by your health care provider. Make sure you discuss any questions you have with your health care provider. Document Released: 03/26/2015 Document Revised: 11/17/2015 Document Reviewed: 12/29/2014 Elsevier Interactive Patient Education  2017 [place] Prevention in the Home Falls can cause injuries. They can happen to people of all ages. There are many things you can do to make your home safe and to help prevent falls. What can I do on the outside of my home?  Regularly fix the edges of walkways and driveways and fix any cracks.  Remove anything that might make you trip as you walk through a door, such as a raised step or threshold.  Trim any bushes or trees on the path to your home.  Use bright outdoor lighting.  Clear any walking paths of anything that might make someone trip, such as rocks or tools.  Regularly check to see if handrails are loose or broken. Make sure that both sides of any steps have handrails.  Any raised decks and porches should have guardrails on the edges.  Have any leaves, snow, or ice cleared regularly.  Use sand or salt on walking paths during winter.  Clean up any spills in your garage right away. This includes oil or grease spills. What can I do in the bathroom?  Use night lights.  Install grab bars by the toilet and in the tub and shower. Do not use towel bars as grab  bars.  Use non-skid mats or decals in the tub or shower.  If you need to sit down in the shower, use a plastic, non-slip stool.  Keep the floor dry. Clean up any water that spills on the floor as soon as it happens.  Remove soap buildup in the tub or shower regularly.  Attach bath mats securely with double-sided non-slip rug tape.  Do not have throw rugs and other things on the floor that can make you trip. What can I do in the bedroom?  Use night lights.  Make sure that you have a light by your bed that is easy to reach.  Do not use any sheets or blankets that are too big for your bed. They should not hang down onto the floor.  Have a firm chair that has side arms. You can use this for support while you get dressed.  Do not have throw rugs and other things on the floor that can make you trip. What can I do in the kitchen?  Clean up any spills right away.  Avoid walking on wet floors.  Keep items that you use a lot in easy-to-reach places.  If you need to reach something above you, use a strong step stool that  has a grab bar.  Keep electrical cords out of the way.  Do not use floor polish or wax that makes floors slippery. If you must use wax, use non-skid floor wax.  Do not have throw rugs and other things on the floor that can make you trip. What can I do with my stairs?  Do not leave any items on the stairs.  Make sure that there are handrails on both sides of the stairs and use them. Fix handrails that are broken or loose. Make sure that handrails are as long as the stairways.  Check any carpeting to make sure that it is firmly attached to the stairs. Fix any carpet that is loose or worn.  Avoid having throw rugs at the top or bottom of the stairs. If you do have throw rugs, attach them to the floor with carpet tape.  Make sure that you have a light switch at the top of the stairs and the bottom of the stairs. If you do not have them, ask someone to add them for  you. What else can I do to help prevent falls?  Wear shoes that:  Do not have high heels.  Have rubber bottoms.  Are comfortable and fit you well.  Are closed at the toe. Do not wear sandals.  If you use a stepladder:  Make sure that it is fully opened. Do not climb a closed stepladder.  Make sure that both sides of the stepladder are locked into place.  Ask someone to hold it for you, if possible.  Clearly mark and make sure that you can see:  Any grab bars or handrails.  First and last steps.  Where the edge of each step is.  Use tools that help you move around (mobility aids) if they are needed. These include:  Canes.  Walkers.  Scooters.  Crutches.  Turn on the lights when you go into a dark area. Replace any light bulbs as soon as they burn out.  Set up your furniture so you have a clear path. Avoid moving your furniture around.  If any of your floors are uneven, fix them.  If there are any pets around you, be aware of where they are.  Review your medicines with your doctor. Some medicines can make you feel dizzy. This can increase your chance of falling. Ask your doctor what other things that you can do to help prevent falls. This information is not intended to replace advice given to you by your health care provider. Make sure you discuss any questions you have with your health care provider. Document Released: 12/24/2008 Document Revised: 08/05/2015 Document Reviewed: 04/03/2014 Elsevier Interactive Patient Education  2017 [employer].

## 2017-11-13 ENCOUNTER — Other Ambulatory Visit: Payer: Self-pay

## 2017-11-13 ENCOUNTER — Ambulatory Visit (INDEPENDENT_AMBULATORY_CARE_PROVIDER_SITE_OTHER): Payer: Medicare Other | Admitting: Family Medicine

## 2017-11-13 ENCOUNTER — Encounter: Payer: Self-pay | Admitting: Family Medicine

## 2017-11-13 VITALS — BP 112/71 | HR 66 | Temp 97.5°F | Ht 66.0 in | Wt 216.0 lb

## 2017-11-13 DIAGNOSIS — IMO0002 Reserved for concepts with insufficient information to code with codable children: Secondary | ICD-10-CM

## 2017-11-13 DIAGNOSIS — I129 Hypertensive chronic kidney disease with stage 1 through stage 4 chronic kidney disease, or unspecified chronic kidney disease: Secondary | ICD-10-CM

## 2017-11-13 DIAGNOSIS — M48061 Spinal stenosis, lumbar region without neurogenic claudication: Secondary | ICD-10-CM

## 2017-11-13 DIAGNOSIS — E039 Hypothyroidism, unspecified: Secondary | ICD-10-CM

## 2017-11-13 DIAGNOSIS — E1165 Type 2 diabetes mellitus with hyperglycemia: Secondary | ICD-10-CM

## 2017-11-13 DIAGNOSIS — R300 Dysuria: Secondary | ICD-10-CM

## 2017-11-13 DIAGNOSIS — I25118 Atherosclerotic heart disease of native coronary artery with other forms of angina pectoris: Secondary | ICD-10-CM

## 2017-11-13 DIAGNOSIS — E782 Mixed hyperlipidemia: Secondary | ICD-10-CM

## 2017-11-13 DIAGNOSIS — E118 Type 2 diabetes mellitus with unspecified complications: Secondary | ICD-10-CM | POA: Diagnosis not present

## 2017-11-13 LAB — MICROSCOPIC EXAMINATION: WBC, UA: >30 /hpf — AB (ref 0–5)

## 2017-11-13 LAB — UA/M W/RFLX CULTURE, ROUTINE
Bilirubin, UA: NEGATIVE
Ketones, UA: NEGATIVE
Nitrite, UA: NEGATIVE
Specific Gravity, UA: 1.025 (ref 1.005–1.030)
Urobilinogen, Ur: 0.2 mg/dL (ref 0.2–1.0)
pH, UA: 5.5 (ref 5.0–7.5)

## 2017-11-13 LAB — CBC WITH DIFFERENTIAL/PLATELET
Hematocrit: 41.4 % (ref 34.0–46.6)
Hemoglobin: 14.1 g/dL (ref 11.1–15.9)
Lymphocytes Absolute: 3.5 10*3/uL — ABNORMAL HIGH (ref 0.7–3.1)
Lymphs: 33 %
MCH: 30.5 pg (ref 26.6–33.0)
MCHC: 34.1 g/dL (ref 31.5–35.7)
MCV: 90 fL (ref 79–97)
MID (Absolute): 1.3 10*3/uL (ref 0.1–1.6)
MID: 12 %
Neutrophils Absolute: 5.8 10*3/uL (ref 1.4–7.0)
Neutrophils: 55 %
Platelets: 375 10*3/uL (ref 150–450)
RBC: 4.62 x10E6/uL (ref 3.77–5.28)
RDW: 14.1 % (ref 12.3–15.4)
WBC: 10.6 10*3/uL (ref 3.4–10.8)

## 2017-11-13 LAB — BAYER DCA HB A1C WAIVED: HB A1C (BAYER DCA - WAIVED): 11.6 % — ABNORMAL HIGH (ref <7.0)

## 2017-11-13 MED ORDER — GLIPIZIDE 5 MG PO TABS: ORAL_TABLET | ORAL | 1 refills | Status: DC

## 2017-11-13 MED ORDER — LOSARTAN POTASSIUM 50 MG PO TABS: 50.0000 mg | ORAL_TABLET | Freq: Every day | ORAL | 3 refills | Status: DC

## 2017-11-13 MED ORDER — GABAPENTIN 300 MG PO CAPS: 300.0000 mg | ORAL_CAPSULE | Freq: Three times a day (TID) | ORAL | 3 refills | Status: DC

## 2017-11-13 MED ORDER — DULOXETINE HCL 60 MG PO CPEP: 60.0000 mg | ORAL_CAPSULE | Freq: Every day | ORAL | 1 refills | Status: DC

## 2017-11-13 MED ORDER — METFORMIN HCL ER 500 MG PO TB24: 1000.0000 mg | ORAL_TABLET | Freq: Two times a day (BID) | ORAL | 1 refills | Status: DC

## 2017-11-13 MED ORDER — METOPROLOL SUCCINATE ER 50 MG PO TB24: ORAL_TABLET | ORAL | 3 refills | Status: DC

## 2017-11-13 MED ORDER — DULAGLUTIDE 0.75 MG/0.5ML ~~LOC~~ SOAJ: 0.7500 mg | SUBCUTANEOUS | 3 refills | Status: DC

## 2017-11-13 MED ORDER — ISOSORBIDE MONONITRATE ER 60 MG PO TB24: 60.0000 mg | ORAL_TABLET | ORAL | 3 refills | Status: DC

## 2017-11-13 MED ORDER — ROSUVASTATIN CALCIUM 40 MG PO TABS: 40.0000 mg | ORAL_TABLET | Freq: Every day | ORAL | 3 refills | Status: DC

## 2017-11-13 NOTE — Assessment & Plan Note (Signed)
Under good control on current regimen. Continue current regimen. Continue to monitor. Call with any concerns. Refills given.   

## 2017-11-13 NOTE — Assessment & Plan Note (Signed)
Rechecking levels today. Await results. Call with any concerns.  

## 2017-11-13 NOTE — Assessment & Plan Note (Signed)
Not under good control with A1c of 11.6- continue meformin and glipizide and add trulicity. Recheck 1 month.

## 2017-11-13 NOTE — Progress Notes (Signed)
BP 112/71   Pulse 66   Temp (!) 97.5 F (36.4 C) (Oral)   Ht 5\' 6"  (1.676 m)   Wt 216 lb (98 kg)   SpO2 95%   BMI 34.86 kg/m    Subjective:    Patient ID: Tammy [name], female    DOB: 05-Nov-1945, 72 y.o.   MRN: [id number]  HPI: Tammy [name] is a 72 y.o. female  Chief Complaint  Patient presents with  . Fatigue  . Diarrhea  . Leg Pain    bilateral  . Back Pain    lower back   Known chronic low back pain. Has had numerous back surgeries. Have been trying to get her 2nd opinion from neurosurgery- but since she is close to her last surgery, no one will see her. Orthopedics, who did her last surgery said they couldn't do anything for her. Repeat MRI done in January- which showed no significant change to her MRI. We have offered her 2nd opinion, referral to pain managment PT and medication. Cannot tolerate pain medicine. Referral to neurosurgery at [facility] and PT ordered in January. Referral to physiatry at [facility] was denied for unknown reason. Referral to pain management was declined by patient. Referral to PT placed. Patient has not seen neurosurgery at [facility]. She did see neurology in March- recommended to have EMG and PT and pain management. Had EMG in April which was normal- no sign of radiculopathy. She notes that she was doing better on the cymbalta, but is doing much worse now. She states that she is in a lot of pain. She has not been doing well in terms of her pain. Her back and her leg are doing much worse now. She is getting closer to being a year out from her surgery- so will be able to get a 2nd opinion from neurosurgery as her last surgery was 12/03/16. She is barely able to walk and is in a significant amount of pain. Does not want to go back to PT as she states they pushed her too far and she felt really terrible afterwards. She notes that she is considering seeing chiropractry and is hoping that that will help.   Has been having several UTIs- saw Dr. Bernardo Heater in June for discomfort with  urination. Was supposed to have cystoscopy in July- but had pyuria, so it was cancelled. Has not seen them again. Due to see them again next week.   DIABETES Hypoglycemic episodes:no Polydipsia/polyuria: yes Visual disturbance: no Chest pain: no Paresthesias: yes Glucose Monitoring: no Taking Insulin?: no Blood Pressure Monitoring: not checking Retinal Examination: Up to Date Foot Exam: Up to Date Diabetic Education: Completed Pneumovax: Up to Date Influenza: Not up to Date Aspirin: yes  HYPERTENSION / HYPERLIPIDEMIA Satisfied with current treatment? yes Duration of hypertension: chronic BP monitoring frequency: not checking BP medication side effects: no Past BP meds: imdur, metoprolol, losartan Duration of hyperlipidemia: chronic Cholesterol medication side effects: yes Cholesterol supplements: none Past cholesterol medications: crestor Medication compliance: excellent compliance Aspirin: no Recent stressors: no Recurrent headaches: no Visual changes: no Palpitations: no Dyspnea: no Chest pain: no Lower extremity edema: no Dizzy/lightheaded: no  HYPOTHYROIDISM Thyroid control status:controlled Satisfied with current treatment? yes Medication side effects: no Medication compliance: excellent compliance Recent dose adjustment:no Fatigue: yes Cold intolerance: no Heat intolerance: no Weight gain: no Weight loss: no Constipation: no Diarrhea/loose stools: no Palpitations: no Lower extremity edema: no Anxiety/depressed mood: yes  Relevant past medical, surgical, family and social history reviewed and  updated as indicated. Interim medical history since our last visit reviewed. Allergies and medications reviewed and updated.  Review of Systems  Constitutional: Negative.   HENT: Negative.   Respiratory: Negative.   Cardiovascular: Negative.   Gastrointestinal: Negative.   Genitourinary: Positive for dysuria, frequency and urgency. Negative for decreased  urine volume, difficulty urinating, dyspareunia, enuresis, flank pain, genital sores, hematuria, menstrual problem, pelvic pain, vaginal bleeding, vaginal discharge and vaginal pain.  Musculoskeletal: Positive for arthralgias, back pain and myalgias. Negative for gait problem, joint swelling, neck pain and neck stiffness.  Skin: Negative.   Neurological: Positive for weakness and numbness. Negative for dizziness, tremors, seizures, syncope, facial asymmetry, speech difficulty, light-headedness and headaches.  Psychiatric/Behavioral: Negative.     Per HPI unless specifically indicated above     Objective:    BP 112/71   Pulse 66   Temp (!) 97.5 F (36.4 C) (Oral)   Ht 5\' 6"  (1.676 m)   Wt 216 lb (98 kg)   SpO2 95%   BMI 34.86 kg/m   Wt Readings from Last 3 Encounters:  11/13/17 216 lb (98 kg)  10/31/17 211 lb 11.2 oz (96 kg)  10/05/17 219 lb (99.3 kg)    Physical Exam  Constitutional: She is oriented to person, place, and time. She appears well-developed and well-nourished. No distress.  HENT:  Head: Normocephalic and atraumatic.  Right Ear: Hearing normal.  Left Ear: Hearing normal.  Nose: Nose normal.  Eyes: Conjunctivae and lids are normal. Right eye exhibits no discharge. Left eye exhibits no discharge. No scleral icterus.  Cardiovascular: Normal rate, regular rhythm, normal heart sounds and intact distal pulses. Exam reveals no gallop and no friction rub.  No murmur heard. Pulmonary/Chest: Effort normal and breath sounds normal. No stridor. No respiratory distress. She has no wheezes. She has no rales. She exhibits no tenderness.  Musculoskeletal: Normal range of motion.  Neurological: She is alert and oriented to person, place, and time.  Skin: Skin is warm, dry and intact. Capillary refill takes less than 2 seconds. No rash noted. She is not diaphoretic. No erythema. No pallor.  Psychiatric: She has a normal mood and affect. Her speech is normal and behavior is normal.  Judgment and thought content normal. Cognition and memory are normal.  Nursing note and vitals reviewed.   Results for orders placed or performed in visit on 11/13/17  Microscopic Examination  Result Value Ref Range   WBC, UA >30 (A) 0 - 5 /hpf   RBC, UA 3-10 (A) 0 - 2 /hpf   Epithelial Cells (non renal) 0-10 0 - 10 /hpf   Renal Epithel, UA 0-10 (A) None seen /hpf   Bacteria, UA Many (A) None seen/Few  UA/M w/rflx Culture, Routine  Result Value Ref Range   Specific Gravity, UA 1.025 1.005 - 1.030   pH, UA 5.5 5.0 - 7.5   Color, UA Yellow Yellow   Appearance Ur Turbid (A) Clear   Leukocytes, UA 3+ (A) Negative   Protein, UA 3+ (A) Negative/Trace   Glucose, UA 2+ (A) Negative   Ketones, UA Negative Negative   RBC, UA 3+ (A) Negative   Bilirubin, UA Negative Negative   Urobilinogen, Ur 0.2 0.2 - 1.0 mg/dL   Nitrite, UA Negative Negative   Microscopic Examination See below:   CBC With Differential/Platelet  Result Value Ref Range   WBC 10.6 3.4 - 10.8 x10E3/uL   RBC 4.62 3.77 - 5.28 x10E6/uL   Hemoglobin 14.1 11.1 - 15.9 g/dL  Hematocrit 41.4 34.0 - 46.6 %   MCV 90 79 - 97 fL   MCH 30.5 26.6 - 33.0 pg   MCHC 34.1 31.5 - 35.7 g/dL   RDW 14.1 12.3 - 15.4 %   Platelets 375 150 - 450 x10E3/uL   Neutrophils 55 Not Estab. %   Lymphs 33 Not Estab. %   MID 12 Not Estab. %   Neutrophils Absolute 5.8 1.4 - 7.0 x10E3/uL   Lymphocytes Absolute 3.5 (H) 0.7 - 3.1 x10E3/uL   MID (Absolute) 1.3 0.1 - 1.6 X10E3/uL  Bayer DCA Hb A1c Waived  Result Value Ref Range   HB A1C (BAYER DCA - WAIVED) 11.6 (H) <7.0 %      Assessment & Plan:   Problem List Items Addressed This Visit      Endocrine   Diabetes mellitus type 2 with complications, uncontrolled ([place])    Not under good control with A1c of 11.6- continue meformin and glipizide and add trulicity. Recheck 1 month.       Relevant Medications   rosuvastatin (CRESTOR) 40 MG tablet   metFORMIN (GLUCOPHAGE-XR) 500 MG 24 hr tablet    losartan (COZAAR) 50 MG tablet   glipiZIDE (GLUCOTROL) 5 MG tablet   Dulaglutide (TRULICITY) 0.63 KZ/6.0FU SOPN   Other Relevant Orders   Bayer DCA Hb A1c Waived (Completed)   Comprehensive metabolic panel   Hypothyroid    Rechecking levels today. Await results. Call with any concerns.       Relevant Medications   metoprolol succinate (TOPROL-XL) 50 MG 24 hr tablet   Other Relevant Orders   Comprehensive metabolic panel   TSH     Genitourinary   Benign hypertensive renal disease    Under good control on current regimen. Continue current regimen. Continue to monitor. Call with any concerns. Refills given.        Relevant Orders   Comprehensive metabolic panel     Other   Hyperlipidemia    Under good control on current regimen. Continue current regimen. Continue to monitor. Call with any concerns. Refills given.        Relevant Medications   rosuvastatin (CRESTOR) 40 MG tablet   metoprolol succinate (TOPROL-XL) 50 MG 24 hr tablet   losartan (COZAAR) 50 MG tablet   isosorbide mononitrate (IMDUR) 60 MG 24 hr tablet   Other Relevant Orders   Comprehensive metabolic panel   Lipid Panel w/o Chol/HDL Ratio   Spinal stenosis of lumbar region    Not doing well. Will get her into see a new neurosurgery after 12/03/17 for 2nd opinion. Does not want pain management or PT right now. Will see chiropractry. Call with any concerns.       Relevant Orders   Ambulatory referral to Neurosurgery    Other Visit Diagnoses    Dysuria    -  Primary   Likely due to 11.6 A1c and 3+ glucose- will await culture and start trulicity. Follow up with urology as neeed. Call with any concerns.    Relevant Orders   UA/M w/rflx Culture, Routine (Completed)   CBC With Differential/Platelet (Completed)   Urine Culture       Follow up plan: Return in about 4 weeks (around 12/11/2017).

## 2017-11-13 NOTE — Assessment & Plan Note (Signed)
Not doing well. Will get her into see a new neurosurgery after 12/03/17 for 2nd opinion. Does not want pain management or PT right now. Will see chiropractry. Call with any concerns.

## 2017-11-14 ENCOUNTER — Telehealth: Payer: Self-pay | Admitting: Family Medicine

## 2017-11-14 DIAGNOSIS — E1122 Type 2 diabetes mellitus with diabetic chronic kidney disease: Secondary | ICD-10-CM

## 2017-11-14 DIAGNOSIS — N184 Chronic kidney disease, stage 4 (severe): Secondary | ICD-10-CM

## 2017-11-14 LAB — TSH: TSH: 6.74 u[IU]/mL — ABNORMAL HIGH (ref 0.450–4.500)

## 2017-11-14 LAB — COMPREHENSIVE METABOLIC PANEL
ALT: 5 IU/L (ref 0–32)
AST: 14 IU/L (ref 0–40)
Albumin/Globulin Ratio: 1.2 (ref 1.2–2.2)
Albumin: 4 g/dL (ref 3.5–4.8)
Alkaline Phosphatase: 97 IU/L (ref 39–117)
BUN/Creatinine Ratio: 13 (ref 12–28)
BUN: 30 mg/dL — ABNORMAL HIGH (ref 8–27)
Bilirubin Total: 0.3 mg/dL (ref 0.0–1.2)
CO2: 22 mmol/L (ref 20–29)
Calcium: 9.2 mg/dL (ref 8.7–10.3)
Chloride: 100 mmol/L (ref 96–106)
Creatinine, Ser: 2.36 mg/dL — ABNORMAL HIGH (ref 0.57–1.00)
GFR calc Af Amer: 23 mL/min/{1.73_m2} — ABNORMAL LOW (ref >59)
GFR calc non Af Amer: 20 mL/min/{1.73_m2} — ABNORMAL LOW (ref >59)
Globulin, Total: 3.4 g/dL (ref 1.5–4.5)
Glucose: 239 mg/dL — ABNORMAL HIGH (ref 65–99)
Potassium: 4.8 mmol/L (ref 3.5–5.2)
Sodium: 139 mmol/L (ref 134–144)
Total Protein: 7.4 g/dL (ref 6.0–8.5)

## 2017-11-14 LAB — LIPID PANEL W/O CHOL/HDL RATIO
Cholesterol, Total: 119 mg/dL (ref 100–199)
HDL: 36 mg/dL — ABNORMAL LOW (ref >39)
LDL Calculated: 37 mg/dL (ref 0–99)
Triglycerides: 232 mg/dL — ABNORMAL HIGH (ref 0–149)
VLDL Cholesterol Cal: 46 mg/dL — ABNORMAL HIGH (ref 5–40)

## 2017-11-14 MED ORDER — LEVOTHYROXINE SODIUM 150 MCG PO TABS: 150.0000 ug | ORAL_TABLET | Freq: Every day | ORAL | 3 refills | Status: DC

## 2017-11-14 NOTE — Telephone Encounter (Signed)
According to referral notes, patient has appointment with Dr. Abigail Butts 11/21/17.

## 2017-11-14 NOTE — Telephone Encounter (Signed)
Called patient with her results. Kidney function worse. Has seen Dr. Rolly Salter in the past- can we please see about getting her another appointment. Referral generated. Thyroid also off with TSH at 6- will increase medicine to 143mcg and recheck 6 weeks. Call with any concerns.

## 2017-11-15 ENCOUNTER — Telehealth: Payer: Self-pay | Admitting: Family Medicine

## 2017-11-15 LAB — URINE CULTURE

## 2017-11-15 NOTE — Telephone Encounter (Signed)
Received a call from Libby with Dr Arnoldo Morale office stating the patient cannot be seen at their office according to Dr Arnoldo Morale she needs to be seen by the provider that did the previous surgeries.  She states he has denied the previous 3 referrals.  If any questions call Rollene Fare [phone]    Thank you

## 2017-11-15 NOTE — Telephone Encounter (Signed)
It was my understanding that they would see her 1+ years after her last surgery (which will be 12/04/17)- her current surgeon cannot do anything for her and she would like a 2nd opinion. Can we find out if they will never see her, or if they will see her a year out, just so we can clarify?

## 2017-11-15 NOTE — Telephone Encounter (Signed)
Rollene Fare returned my call. I explained to her what Dr, Wynetta Emery was wanting to know. Rollene Fare states that they typically dont have the 1 year rule. She states that they have seen patient 4 months after a surgery. But she states that she will put the referral on the doctors desk and see what he says for the 3rd time.

## 2017-11-15 NOTE — Telephone Encounter (Signed)
Patient notified Rx at pharmacy

## 2017-11-15 NOTE — Telephone Encounter (Signed)
Copied from [place] [id number]. Topic: Quick Communication - Rx Refill/Question >> Nov 15, 2017  9:01 AM Reyne Dumas L wrote: Medication: levothyroxine (SYNTHROID) 150 MCG tablet  Has the patient contacted their pharmacy? No - pt thought she had some but she doesn't and needs script for new doseage (Agent: If no, request that the patient contact the pharmacy for the refill.) (Agent: If yes, when and what did the pharmacy advise?)  Preferred Pharmacy (with phone number or street name): [place], [place] - [place] [phone] (Phone) [phone] (Fax)  Agent: Please be advised that RX refills may take up to 3 business days. We ask that you follow-up with your pharmacy.

## 2017-11-15 NOTE — Telephone Encounter (Signed)
Called and left Rollene Fare a VM asking for her to please return my call for clarification on seeing this patient.

## 2017-11-19 ENCOUNTER — Telehealth: Payer: Self-pay | Admitting: Family Medicine

## 2017-11-19 NOTE — Telephone Encounter (Signed)
Patient notified

## 2017-11-19 NOTE — Telephone Encounter (Signed)
Please let her know that her urine did not grow out any infection. Thanks!

## 2017-11-21 ENCOUNTER — Encounter: Payer: Self-pay | Admitting: Urology

## 2017-11-21 ENCOUNTER — Ambulatory Visit (INDEPENDENT_AMBULATORY_CARE_PROVIDER_SITE_OTHER): Payer: Medicare Other | Admitting: Urology

## 2017-11-21 VITALS — BP 102/63 | HR 94 | Ht 66.0 in | Wt 216.0 lb

## 2017-11-21 DIAGNOSIS — I129 Hypertensive chronic kidney disease with stage 1 through stage 4 chronic kidney disease, or unspecified chronic kidney disease: Secondary | ICD-10-CM | POA: Diagnosis not present

## 2017-11-21 DIAGNOSIS — N390 Urinary tract infection, site not specified: Secondary | ICD-10-CM | POA: Diagnosis not present

## 2017-11-21 DIAGNOSIS — E1122 Type 2 diabetes mellitus with diabetic chronic kidney disease: Secondary | ICD-10-CM | POA: Diagnosis not present

## 2017-11-21 DIAGNOSIS — N184 Chronic kidney disease, stage 4 (severe): Secondary | ICD-10-CM | POA: Diagnosis not present

## 2017-11-21 DIAGNOSIS — R809 Proteinuria, unspecified: Secondary | ICD-10-CM | POA: Diagnosis not present

## 2017-11-21 LAB — URINALYSIS, COMPLETE
Bilirubin, UA: NEGATIVE
Glucose, UA: NEGATIVE
Ketones, UA: NEGATIVE
Nitrite, UA: NEGATIVE
Specific Gravity, UA: 1.025 (ref 1.005–1.030)
Urobilinogen, Ur: 0.2 mg/dL (ref 0.2–1.0)
pH, UA: 5.5 (ref 5.0–7.5)

## 2017-11-21 LAB — MICROSCOPIC EXAMINATION
Epithelial Cells (non renal): >10 /hpf — ABNORMAL HIGH (ref 0–10)
WBC, UA: >30 /hpf — ABNORMAL HIGH (ref 0–5)

## 2017-11-21 MED ORDER — LIDOCAINE HCL URETHRAL/MUCOSAL 2 % EX GEL
1.0000 "application " | Freq: Once | CUTANEOUS | Status: AC
  Administered 2017-11-21: 1 via URETHRAL

## 2017-11-21 NOTE — Addendum Note (Signed)
Addended by: Donalee Citrin on: 11/21/2017 03:07 PM   Modules accepted: Orders

## 2017-11-21 NOTE — Progress Notes (Signed)
   11/21/17  CC:  Chief Complaint  Patient presents with  . Recurrent UTI    HPI: 72 year old female with pyuria and urine cultures growing mixed flora.  She was complaining of chronic dysuria and bladder pain however states ever since starting Trulicity for her diabetes she has noted marked improvement in her voiding pattern.  She remains on Myrbetriq.  Blood pressure 102/63, pulse 94, height 5\' 6"  (1.676 m), weight 216 lb (98 kg). NED. A&Ox3.   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  Lidocaine jelly was administered per urethral meatus.    Preoperative abx where received prior to procedure.    Procedure: - Flexible cystoscope introduced, without any difficulty.   - Thorough search of the bladder revealed:    normal urethral meatus    normal urothelium    no stones    no ulcers     no tumors    no urethral polyps    no trabeculation  - Ureteral orifices were normal in position and appearance.  Post-Procedure: - Patient tolerated the procedure well  Assessment/ Plan: No significant abnormalities identified on cystoscopy.  Her symptoms have significantly improved since starting Trulicity.  Follow-up 3 months and she was instructed to call earlier for any worsening of her symptoms.   [name], MD

## 2017-11-23 ENCOUNTER — Telehealth: Payer: Self-pay

## 2017-11-23 NOTE — Telephone Encounter (Signed)
Patient notified, she states that she will discuss this with you at her next office visit.

## 2017-11-23 NOTE — Telephone Encounter (Signed)
Please let Tammy [name] and her husband know that Dr. Arnoldo Morale will not see her. I can refer her to [facility] or [facility] or [facility] neurosurgery if she'd like or pain management, but [facility] [place] is not an option and will not be.

## 2017-11-23 NOTE — Telephone Encounter (Signed)
Copied from [place] [id number]. Topic: Referral - Question >> Nov 23, 2017 12:27 PM Conception Chancy, NT wrote: Reason for CRM: Rollene Fare is calling from [facility] and states that Dr. Arnoldo Morale has denied this patient for the 4th time and recommends her seeing her previous surgeon or a pain management doctor.

## 2017-11-29 ENCOUNTER — Other Ambulatory Visit: Payer: Self-pay

## 2017-11-29 MED ORDER — TRIMETHOPRIM 100 MG PO TABS: 100.0000 mg | ORAL_TABLET | Freq: Every day | ORAL | 6 refills | Status: DC

## 2017-12-05 DIAGNOSIS — E063 Autoimmune thyroiditis: Secondary | ICD-10-CM | POA: Diagnosis not present

## 2017-12-05 DIAGNOSIS — E890 Postprocedural hypothyroidism: Secondary | ICD-10-CM | POA: Diagnosis not present

## 2017-12-11 ENCOUNTER — Other Ambulatory Visit: Payer: Self-pay | Admitting: Family Medicine

## 2017-12-11 NOTE — Telephone Encounter (Signed)
Requested Prescriptions  Pending Prescriptions Disp Refills  . pantoprazole (PROTONIX) 40 MG tablet [Pharmacy Med Name: PANTOPRAZOLE SODIUM 40 MG TAB] 90 tablet 1    Sig: TAKE ONE TABLET BY MOUTH EVERY DAY AS NEEDED     Gastroenterology: Proton Pump Inhibitors Passed - 12/11/2017 10:22 AM      Passed - Valid encounter within last 12 months    Recent Outpatient Visits          4 weeks ago [place], Megan P, DO   4 months ago Burning with urination   [facility] Kathrine Haddock, NP   6 months ago Acute cystitis with hematuria   [facility], Megan P, DO   8 months ago Spinal stenosis of lumbar region, unspecified whether neurogenic claudication present   [facility], Megan P, DO   9 months ago Chronic bilateral low back pain with bilateral sciatica   [facility] Volney American, PA-C      Future Appointments            In 1 week Wynetta Emery, Barb Merino, DO [employer], [place]   In 2 months Stoioff, Ronda Fairly, MD [place]   In 53 months  [facility], [place]

## 2017-12-12 DIAGNOSIS — E669 Obesity, unspecified: Secondary | ICD-10-CM | POA: Diagnosis not present

## 2017-12-12 DIAGNOSIS — E063 Autoimmune thyroiditis: Secondary | ICD-10-CM | POA: Diagnosis not present

## 2017-12-12 DIAGNOSIS — I10 Essential (primary) hypertension: Secondary | ICD-10-CM | POA: Diagnosis not present

## 2017-12-12 DIAGNOSIS — E7801 Familial hypercholesterolemia: Secondary | ICD-10-CM | POA: Diagnosis not present

## 2017-12-12 DIAGNOSIS — E1165 Type 2 diabetes mellitus with hyperglycemia: Secondary | ICD-10-CM | POA: Diagnosis not present

## 2017-12-12 DIAGNOSIS — N39490 Overflow incontinence: Secondary | ICD-10-CM | POA: Diagnosis not present

## 2017-12-12 DIAGNOSIS — E890 Postprocedural hypothyroidism: Secondary | ICD-10-CM | POA: Diagnosis not present

## 2017-12-12 DIAGNOSIS — Z6837 Body mass index (BMI) 37.0-37.9, adult: Secondary | ICD-10-CM | POA: Diagnosis not present

## 2017-12-12 DIAGNOSIS — I2699 Other pulmonary embolism without acute cor pulmonale: Secondary | ICD-10-CM | POA: Diagnosis not present

## 2017-12-12 DIAGNOSIS — K219 Gastro-esophageal reflux disease without esophagitis: Secondary | ICD-10-CM | POA: Diagnosis not present

## 2017-12-12 DIAGNOSIS — Z8249 Family history of ischemic heart disease and other diseases of the circulatory system: Secondary | ICD-10-CM | POA: Diagnosis not present

## 2017-12-18 ENCOUNTER — Ambulatory Visit (INDEPENDENT_AMBULATORY_CARE_PROVIDER_SITE_OTHER): Payer: Medicare Other | Admitting: Family Medicine

## 2017-12-18 ENCOUNTER — Encounter: Payer: Self-pay | Admitting: Family Medicine

## 2017-12-18 ENCOUNTER — Ambulatory Visit: Payer: Medicare Other

## 2017-12-18 VITALS — BP 109/72 | HR 73 | Wt 215.0 lb

## 2017-12-18 DIAGNOSIS — J302 Other seasonal allergic rhinitis: Secondary | ICD-10-CM | POA: Diagnosis not present

## 2017-12-18 DIAGNOSIS — I25118 Atherosclerotic heart disease of native coronary artery with other forms of angina pectoris: Secondary | ICD-10-CM | POA: Diagnosis not present

## 2017-12-18 DIAGNOSIS — Z23 Encounter for immunization: Secondary | ICD-10-CM

## 2017-12-18 DIAGNOSIS — E1165 Type 2 diabetes mellitus with hyperglycemia: Secondary | ICD-10-CM | POA: Diagnosis not present

## 2017-12-18 DIAGNOSIS — E118 Type 2 diabetes mellitus with unspecified complications: Secondary | ICD-10-CM | POA: Diagnosis not present

## 2017-12-18 DIAGNOSIS — M48061 Spinal stenosis, lumbar region without neurogenic claudication: Secondary | ICD-10-CM

## 2017-12-18 DIAGNOSIS — IMO0002 Reserved for concepts with insufficient information to code with codable children: Secondary | ICD-10-CM

## 2017-12-18 MED ORDER — FEXOFENADINE HCL 180 MG PO TABS: 180.0000 mg | ORAL_TABLET | Freq: Every day | ORAL | 1 refills | Status: DC

## 2017-12-18 NOTE — Patient Instructions (Signed)

## 2017-12-18 NOTE — Assessment & Plan Note (Signed)
Will treat with allegra. Call with any concerns or if not getting better.

## 2017-12-18 NOTE — Assessment & Plan Note (Signed)
Differing opinion with her husband right now. She feels like she is doing better and does not want to see neurosurgery right now. Will call if she changes her mind and we will refer to [facility] or [facility].

## 2017-12-18 NOTE — Assessment & Plan Note (Signed)
Doing much better. Tolerating her trulicity well and feeling much better. Will increase trulicity to 1.5mg  weekly and recheck a1c in 2 months.

## 2017-12-18 NOTE — Progress Notes (Signed)
BP 109/72   Pulse 73   Wt 215 lb (97.5 kg)   SpO2 95%   BMI 34.70 kg/m    Subjective:    Patient ID: Tammy [name], female    DOB: 02/19/1946, 72 y.o.   MRN: [id number]  HPI: Tammy [name] is a 72 y.o. female  Chief Complaint  Patient presents with  . Follow-up  . Diabetes  . Allergies    x 1 week, nasal congestion, sneezing, productive cough   DIABETES- urine is doing much better since starting on the trulicity. Feeling well! Tolerating it well! No concerns. Sugars start running up on Sunday and Monday. Hypoglycemic episodes:no Polydipsia/polyuria: no Visual disturbance: no Chest pain: no Paresthesias: no Glucose Monitoring: yes  Accucheck frequency: Daily Taking Insulin?: no Blood Pressure Monitoring: rarely Retinal Examination: Up to Date Foot Exam: Up to Date Diabetic Education: Completed Pneumovax: Up to Date Influenza: Up to Date Aspirin: yes   Relevant past medical, surgical, family and social history reviewed and updated as indicated. Interim medical history since our last visit reviewed. Allergies and medications reviewed and updated.  Review of Systems  Constitutional: Negative.   HENT: Positive for congestion, postnasal drip, rhinorrhea and sneezing. Negative for dental problem, drooling, ear discharge, ear pain, facial swelling, hearing loss, mouth sores, nosebleeds, sinus pressure, sinus pain, sore throat, tinnitus, trouble swallowing and voice change.   Respiratory: Negative.   Cardiovascular: Negative.   Genitourinary: Negative.   Musculoskeletal: Positive for back pain and myalgias. Negative for arthralgias, gait problem, joint swelling, neck pain and neck stiffness.  Skin: Negative.   Neurological: Negative.   Psychiatric/Behavioral: Negative.     Per HPI unless specifically indicated above     Objective:    BP 109/72   Pulse 73   Wt 215 lb (97.5 kg)   SpO2 95%   BMI 34.70 kg/m   Wt Readings from Last 3 Encounters:  12/18/17 215 lb (97.5  kg)  11/21/17 216 lb (98 kg)  11/13/17 216 lb (98 kg)    Physical Exam  Constitutional: She is oriented to person, place, and time. She appears well-developed and well-nourished. No distress.  HENT:  Head: Normocephalic and atraumatic.  Right Ear: Hearing normal.  Left Ear: Hearing normal.  Nose: Nose normal.  Eyes: Conjunctivae and lids are normal. Right eye exhibits no discharge. Left eye exhibits no discharge. No scleral icterus.  Cardiovascular: Normal rate, regular rhythm, normal heart sounds and intact distal pulses. Exam reveals no gallop and no friction rub.  No murmur heard. Pulmonary/Chest: Effort normal and breath sounds normal. No stridor. No respiratory distress. She has no wheezes. She has no rales. She exhibits no tenderness.  Musculoskeletal: Normal range of motion.  Neurological: She is alert and oriented to person, place, and time.  Skin: Skin is warm, dry and intact. Capillary refill takes less than 2 seconds. No rash noted. She is not diaphoretic. No erythema. No pallor.  Psychiatric: She has a normal mood and affect. Her speech is normal and behavior is normal. Judgment and thought content normal. Cognition and memory are normal.  Nursing note and vitals reviewed.   Results for orders placed or performed in visit on 11/21/17  Microscopic Examination  Result Value Ref Range   WBC, UA >30 (H) 0 - 5 /hpf   RBC, UA 11-30 (A) 0 - 2 /hpf   Epithelial Cells (non renal) >10 (H) 0 - 10 /hpf   Casts Present (A) None seen /lpf   Cast Type  Granular casts (A) N/A   Mucus, UA Present (A) Not Estab.   Bacteria, UA Many (A) None seen/Few  Urinalysis, Complete  Result Value Ref Range   Specific Gravity, UA 1.025 1.005 - 1.030   pH, UA 5.5 5.0 - 7.5   Color, UA Yellow Yellow   Appearance Ur Cloudy (A) Clear   Leukocytes, UA 3+ (A) Negative   Protein, UA 3+ (A) Negative/Trace   Glucose, UA Negative Negative   Ketones, UA Negative Negative   RBC, UA 2+ (A) Negative    Bilirubin, UA Negative Negative   Urobilinogen, Ur 0.2 0.2 - 1.0 mg/dL   Nitrite, UA Negative Negative   Microscopic Examination See below:       Assessment & Plan:   Problem List Items Addressed This Visit      Respiratory   Rhinitis, allergic    Will treat with allegra. Call with any concerns or if not getting better.        Endocrine   Diabetes mellitus type 2 with complications, uncontrolled ([place]) - Primary    Doing much better. Tolerating her trulicity well and feeling much better. Will increase trulicity to 1.5mg  weekly and recheck a1c in 2 months.         Other   Spinal stenosis of lumbar region    Differing opinion with her husband right now. She feels like she is doing better and does not want to see neurosurgery right now. Will call if she changes her mind and we will refer to [facility] or [facility].       Other Visit Diagnoses    Needs flu shot       Flu shot given today.   Relevant Orders   Flu vaccine HIGH DOSE PF (Fluzone High dose) (Completed)       Follow up plan: Return in about 2 months (around 02/17/2018) for DM follow up.

## 2017-12-21 DIAGNOSIS — E039 Hypothyroidism, unspecified: Secondary | ICD-10-CM | POA: Diagnosis not present

## 2017-12-21 DIAGNOSIS — J45909 Unspecified asthma, uncomplicated: Secondary | ICD-10-CM | POA: Diagnosis not present

## 2017-12-21 DIAGNOSIS — I2699 Other pulmonary embolism without acute cor pulmonale: Secondary | ICD-10-CM | POA: Diagnosis not present

## 2017-12-21 DIAGNOSIS — E1165 Type 2 diabetes mellitus with hyperglycemia: Secondary | ICD-10-CM | POA: Diagnosis not present

## 2017-12-21 DIAGNOSIS — E063 Autoimmune thyroiditis: Secondary | ICD-10-CM | POA: Diagnosis not present

## 2017-12-21 DIAGNOSIS — Z6837 Body mass index (BMI) 37.0-37.9, adult: Secondary | ICD-10-CM | POA: Diagnosis not present

## 2017-12-21 DIAGNOSIS — I10 Essential (primary) hypertension: Secondary | ICD-10-CM | POA: Diagnosis not present

## 2017-12-21 DIAGNOSIS — Z8249 Family history of ischemic heart disease and other diseases of the circulatory system: Secondary | ICD-10-CM | POA: Diagnosis not present

## 2017-12-21 DIAGNOSIS — Z6838 Body mass index (BMI) 38.0-38.9, adult: Secondary | ICD-10-CM | POA: Diagnosis not present

## 2017-12-21 DIAGNOSIS — E890 Postprocedural hypothyroidism: Secondary | ICD-10-CM | POA: Diagnosis not present

## 2017-12-21 DIAGNOSIS — M2550 Pain in unspecified joint: Secondary | ICD-10-CM | POA: Diagnosis not present

## 2017-12-21 DIAGNOSIS — K219 Gastro-esophageal reflux disease without esophagitis: Secondary | ICD-10-CM | POA: Diagnosis not present

## 2017-12-21 DIAGNOSIS — E7801 Familial hypercholesterolemia: Secondary | ICD-10-CM | POA: Diagnosis not present

## 2017-12-21 DIAGNOSIS — E669 Obesity, unspecified: Secondary | ICD-10-CM | POA: Diagnosis not present

## 2017-12-27 DIAGNOSIS — N390 Urinary tract infection, site not specified: Secondary | ICD-10-CM | POA: Diagnosis not present

## 2017-12-27 DIAGNOSIS — E785 Hyperlipidemia, unspecified: Secondary | ICD-10-CM | POA: Diagnosis not present

## 2017-12-27 DIAGNOSIS — N184 Chronic kidney disease, stage 4 (severe): Secondary | ICD-10-CM | POA: Diagnosis not present

## 2017-12-27 DIAGNOSIS — E1122 Type 2 diabetes mellitus with diabetic chronic kidney disease: Secondary | ICD-10-CM | POA: Diagnosis not present

## 2017-12-27 DIAGNOSIS — I129 Hypertensive chronic kidney disease with stage 1 through stage 4 chronic kidney disease, or unspecified chronic kidney disease: Secondary | ICD-10-CM | POA: Diagnosis not present

## 2017-12-27 DIAGNOSIS — R809 Proteinuria, unspecified: Secondary | ICD-10-CM | POA: Diagnosis not present

## 2018-01-04 ENCOUNTER — Other Ambulatory Visit: Payer: Self-pay | Admitting: Family Medicine

## 2018-01-04 MED ORDER — DULAGLUTIDE 1.5 MG/0.5ML ~~LOC~~ SOAJ: 1.5000 mg | SUBCUTANEOUS | 3 refills | Status: DC

## 2018-01-04 NOTE — Telephone Encounter (Signed)
Copied from [place] [id number]. Topic: Quick Communication - Rx Refill/Question >> Jan 04, 2018  8:33 AM Bea Graff, NT wrote: Medication: Dulaglutide (TRULICITY) 1.5 MG  Has the patient contacted their pharmacy? Yes.   (Agent: If no, request that the patient contact the pharmacy for the refill.) (Agent: If yes, when and what did the pharmacy advise?)  Preferred Pharmacy (with phone number or street name): [place], [place] - [place] [phone] (Phone) [phone] (Fax)    Agent: Please be advised that RX refills may take up to 3 business days. We ask that you follow-up with your pharmacy.

## 2018-01-04 NOTE — Telephone Encounter (Signed)
Requested medication (s) are due for refill today: yes  Requested medication (s) are on the active medication list: yes  Last refill:  11/13/17  Future visit scheduled: yes  Notes to clinic:  Please note dose has changed per office note Trulicity 1.5mg  is being requested.    Requested Prescriptions  Pending Prescriptions Disp Refills   Dulaglutide (TRULICITY) 7.56 EP/3.2RJ SOPN 4 pen 3    Sig: Inject 0.75 mg into the skin once a week.     Endocrinology:  Diabetes - GLP-1 Receptor Agonists Failed - 01/04/2018  8:42 AM      Failed - HBA1C is between 0 and 7.9 and within 180 days    Hemoglobin A1C  Date Value Ref Range Status  01/31/2016 8.7  Final   Hgb A1c MFr Bld  Date Value Ref Range Status  01/03/2017 10.7 (H) 4.8 - 5.6 % Final    Comment:             Prediabetes: 5.7 - 6.4          Diabetes: >6.4          Glycemic control for adults with diabetes: <7.0          Passed - Valid encounter within last 6 months    Recent Outpatient Visits          2 weeks ago Diabetes mellitus type 2 with complications, uncontrolled ([place])   [place], Tammy P, DO   1 month ago [place], Tammy P, DO   5 months ago Burning with urination   [facility] Kathrine Haddock, NP   6 months ago Acute cystitis with hematuria   [place], Tammy P, DO   9 months ago Spinal stenosis of lumbar region, unspecified whether neurogenic claudication present   [place], Tammy Merino, DO      Future Appointments            In 1 month Johnson, Tammy Merino, DO [employer], [place]   In 1 month Stoioff, Ronda Fairly, MD [place]   In 10 months  [employer], [place]

## 2018-01-14 DIAGNOSIS — I129 Hypertensive chronic kidney disease with stage 1 through stage 4 chronic kidney disease, or unspecified chronic kidney disease: Secondary | ICD-10-CM | POA: Diagnosis not present

## 2018-01-14 DIAGNOSIS — E1122 Type 2 diabetes mellitus with diabetic chronic kidney disease: Secondary | ICD-10-CM | POA: Diagnosis not present

## 2018-01-14 DIAGNOSIS — N183 Chronic kidney disease, stage 3 (moderate): Secondary | ICD-10-CM | POA: Diagnosis not present

## 2018-01-14 DIAGNOSIS — N179 Acute kidney failure, unspecified: Secondary | ICD-10-CM | POA: Diagnosis not present

## 2018-01-14 DIAGNOSIS — R809 Proteinuria, unspecified: Secondary | ICD-10-CM | POA: Diagnosis not present

## 2018-01-22 DIAGNOSIS — M5134 Other intervertebral disc degeneration, thoracic region: Secondary | ICD-10-CM | POA: Diagnosis not present

## 2018-01-22 DIAGNOSIS — M5033 Other cervical disc degeneration, cervicothoracic region: Secondary | ICD-10-CM | POA: Diagnosis not present

## 2018-01-22 DIAGNOSIS — M9902 Segmental and somatic dysfunction of thoracic region: Secondary | ICD-10-CM | POA: Diagnosis not present

## 2018-01-22 DIAGNOSIS — M9901 Segmental and somatic dysfunction of cervical region: Secondary | ICD-10-CM | POA: Diagnosis not present

## 2018-01-23 DIAGNOSIS — M5033 Other cervical disc degeneration, cervicothoracic region: Secondary | ICD-10-CM | POA: Diagnosis not present

## 2018-01-23 DIAGNOSIS — M5134 Other intervertebral disc degeneration, thoracic region: Secondary | ICD-10-CM | POA: Diagnosis not present

## 2018-01-23 DIAGNOSIS — M9901 Segmental and somatic dysfunction of cervical region: Secondary | ICD-10-CM | POA: Diagnosis not present

## 2018-01-23 DIAGNOSIS — M9902 Segmental and somatic dysfunction of thoracic region: Secondary | ICD-10-CM | POA: Diagnosis not present

## 2018-01-24 DIAGNOSIS — M9902 Segmental and somatic dysfunction of thoracic region: Secondary | ICD-10-CM | POA: Diagnosis not present

## 2018-01-24 DIAGNOSIS — M5134 Other intervertebral disc degeneration, thoracic region: Secondary | ICD-10-CM | POA: Diagnosis not present

## 2018-01-24 DIAGNOSIS — M9901 Segmental and somatic dysfunction of cervical region: Secondary | ICD-10-CM | POA: Diagnosis not present

## 2018-01-24 DIAGNOSIS — M5033 Other cervical disc degeneration, cervicothoracic region: Secondary | ICD-10-CM | POA: Diagnosis not present

## 2018-01-28 DIAGNOSIS — M5134 Other intervertebral disc degeneration, thoracic region: Secondary | ICD-10-CM | POA: Diagnosis not present

## 2018-01-28 DIAGNOSIS — M5033 Other cervical disc degeneration, cervicothoracic region: Secondary | ICD-10-CM | POA: Diagnosis not present

## 2018-01-28 DIAGNOSIS — M9902 Segmental and somatic dysfunction of thoracic region: Secondary | ICD-10-CM | POA: Diagnosis not present

## 2018-01-28 DIAGNOSIS — M9901 Segmental and somatic dysfunction of cervical region: Secondary | ICD-10-CM | POA: Diagnosis not present

## 2018-01-30 DIAGNOSIS — M5033 Other cervical disc degeneration, cervicothoracic region: Secondary | ICD-10-CM | POA: Diagnosis not present

## 2018-01-30 DIAGNOSIS — M9901 Segmental and somatic dysfunction of cervical region: Secondary | ICD-10-CM | POA: Diagnosis not present

## 2018-01-30 DIAGNOSIS — M5134 Other intervertebral disc degeneration, thoracic region: Secondary | ICD-10-CM | POA: Diagnosis not present

## 2018-01-30 DIAGNOSIS — M9902 Segmental and somatic dysfunction of thoracic region: Secondary | ICD-10-CM | POA: Diagnosis not present

## 2018-01-31 DIAGNOSIS — M9901 Segmental and somatic dysfunction of cervical region: Secondary | ICD-10-CM | POA: Diagnosis not present

## 2018-01-31 DIAGNOSIS — M5033 Other cervical disc degeneration, cervicothoracic region: Secondary | ICD-10-CM | POA: Diagnosis not present

## 2018-01-31 DIAGNOSIS — M9902 Segmental and somatic dysfunction of thoracic region: Secondary | ICD-10-CM | POA: Diagnosis not present

## 2018-01-31 DIAGNOSIS — M5134 Other intervertebral disc degeneration, thoracic region: Secondary | ICD-10-CM | POA: Diagnosis not present

## 2018-02-04 DIAGNOSIS — M5134 Other intervertebral disc degeneration, thoracic region: Secondary | ICD-10-CM | POA: Diagnosis not present

## 2018-02-04 DIAGNOSIS — M9901 Segmental and somatic dysfunction of cervical region: Secondary | ICD-10-CM | POA: Diagnosis not present

## 2018-02-04 DIAGNOSIS — M5033 Other cervical disc degeneration, cervicothoracic region: Secondary | ICD-10-CM | POA: Diagnosis not present

## 2018-02-04 DIAGNOSIS — M9902 Segmental and somatic dysfunction of thoracic region: Secondary | ICD-10-CM | POA: Diagnosis not present

## 2018-02-05 DIAGNOSIS — M9901 Segmental and somatic dysfunction of cervical region: Secondary | ICD-10-CM | POA: Diagnosis not present

## 2018-02-05 DIAGNOSIS — M5033 Other cervical disc degeneration, cervicothoracic region: Secondary | ICD-10-CM | POA: Diagnosis not present

## 2018-02-05 DIAGNOSIS — M9902 Segmental and somatic dysfunction of thoracic region: Secondary | ICD-10-CM | POA: Diagnosis not present

## 2018-02-05 DIAGNOSIS — M5134 Other intervertebral disc degeneration, thoracic region: Secondary | ICD-10-CM | POA: Diagnosis not present

## 2018-02-14 DIAGNOSIS — M9902 Segmental and somatic dysfunction of thoracic region: Secondary | ICD-10-CM | POA: Diagnosis not present

## 2018-02-14 DIAGNOSIS — M9901 Segmental and somatic dysfunction of cervical region: Secondary | ICD-10-CM | POA: Diagnosis not present

## 2018-02-14 DIAGNOSIS — M5134 Other intervertebral disc degeneration, thoracic region: Secondary | ICD-10-CM | POA: Diagnosis not present

## 2018-02-14 DIAGNOSIS — M5033 Other cervical disc degeneration, cervicothoracic region: Secondary | ICD-10-CM | POA: Diagnosis not present

## 2018-02-18 DIAGNOSIS — M9902 Segmental and somatic dysfunction of thoracic region: Secondary | ICD-10-CM | POA: Diagnosis not present

## 2018-02-18 DIAGNOSIS — M5134 Other intervertebral disc degeneration, thoracic region: Secondary | ICD-10-CM | POA: Diagnosis not present

## 2018-02-18 DIAGNOSIS — M9901 Segmental and somatic dysfunction of cervical region: Secondary | ICD-10-CM | POA: Diagnosis not present

## 2018-02-18 DIAGNOSIS — M5033 Other cervical disc degeneration, cervicothoracic region: Secondary | ICD-10-CM | POA: Diagnosis not present

## 2018-02-19 ENCOUNTER — Encounter: Payer: Self-pay | Admitting: Family Medicine

## 2018-02-19 ENCOUNTER — Ambulatory Visit (INDEPENDENT_AMBULATORY_CARE_PROVIDER_SITE_OTHER): Payer: Medicare Other | Admitting: Family Medicine

## 2018-02-19 VITALS — BP 108/72 | HR 87 | Temp 97.3°F | Wt 213.0 lb

## 2018-02-19 DIAGNOSIS — J069 Acute upper respiratory infection, unspecified: Secondary | ICD-10-CM | POA: Diagnosis not present

## 2018-02-19 DIAGNOSIS — E118 Type 2 diabetes mellitus with unspecified complications: Secondary | ICD-10-CM

## 2018-02-19 DIAGNOSIS — G8929 Other chronic pain: Secondary | ICD-10-CM

## 2018-02-19 DIAGNOSIS — J449 Chronic obstructive pulmonary disease, unspecified: Secondary | ICD-10-CM

## 2018-02-19 DIAGNOSIS — E1165 Type 2 diabetes mellitus with hyperglycemia: Secondary | ICD-10-CM | POA: Diagnosis not present

## 2018-02-19 DIAGNOSIS — M25562 Pain in left knee: Secondary | ICD-10-CM | POA: Diagnosis not present

## 2018-02-19 DIAGNOSIS — M25561 Pain in right knee: Secondary | ICD-10-CM | POA: Diagnosis not present

## 2018-02-19 DIAGNOSIS — IMO0002 Reserved for concepts with insufficient information to code with codable children: Secondary | ICD-10-CM

## 2018-02-19 DIAGNOSIS — I25118 Atherosclerotic heart disease of native coronary artery with other forms of angina pectoris: Secondary | ICD-10-CM

## 2018-02-19 DIAGNOSIS — E039 Hypothyroidism, unspecified: Secondary | ICD-10-CM | POA: Diagnosis not present

## 2018-02-19 LAB — BAYER DCA HB A1C WAIVED: HB A1C (BAYER DCA - WAIVED): 7.4 % — ABNORMAL HIGH (ref <7.0)

## 2018-02-19 MED ORDER — PREDNISONE 50 MG PO TABS: 50.0000 mg | ORAL_TABLET | Freq: Every day | ORAL | 0 refills | Status: DC

## 2018-02-19 NOTE — Assessment & Plan Note (Signed)
We will take over her thyroid medicine. Drawing labs today. Adjust dose as needed. Call with any concerns.

## 2018-02-19 NOTE — Assessment & Plan Note (Signed)
Lungs clear- will treat with burst of prednisone. Call with any concerns.

## 2018-02-19 NOTE — Progress Notes (Signed)
BP 108/72   Pulse 87   Temp (!) 97.3 F (36.3 C) (Oral)   Wt 213 lb (96.6 kg)   SpO2 97%   BMI 34.38 kg/m    Subjective:    Patient ID: Tammy [name], female    DOB: 07-May-1945, 72 y.o.   MRN: [id number]  HPI: Tammy [name] is a 72 y.o. female  Chief Complaint  Patient presents with  . Diabetes  . Sore Throat    congestion, eye pain, sinus issue  . Medication Management    States she would like 90 day supply   DIABETES- has not been taking her metformin because Dr. Rolly Salter stopped it- in September due to renal insuffiency Hypoglycemic episodes:no Polydipsia/polyuria: no Visual disturbance: no Chest pain: no Paresthesias: no Glucose Monitoring: yes  Accucheck frequency: BID  Fasting glucose: 140s-170s Taking Insulin?: no Blood Pressure Monitoring: not checking Retinal Examination: Up to Date Foot Exam: Up to Date Diabetic Education: Completed Pneumovax: Up to Date Influenza: Up to Date Aspirin: no- on eliquis  HYPOTHYROIDISM- was seeing endocrinology, but doesn't want to continue to follow with them. Would like [place] to take over her thyroid.  Thyroid control status:stable Satisfied with current treatment? yes Medication side effects: no Medication compliance: excellent compliance Recent dose adjustment:yes Fatigue: no Cold intolerance: no Heat intolerance: no Weight gain: no Weight loss: no Constipation: no Diarrhea/loose stools: no Palpitations: no Lower extremity edema: no Anxiety/depressed mood: no   UPPER RESPIRATORY TRACT INFECTION Duration: couple of days Worst symptom: cough and congestion Fever: no Cough: yes Shortness of breath: yes Wheezing: yes Chest pain: yes Chest tightness: no Chest congestion: no Nasal congestion: yes Runny nose: yes Post nasal drip: yes Sneezing: yes Sore throat: yes Swollen glands: no Sinus pressure: yes Headache: no Face pain: no Toothache: no Ear pain: no  Ear pressure: yes "right Eyes red/itching:yes Eye  drainage/crusting: yes  Vomiting: no Rash: no Fatigue: yes Sick contacts: yes Strep contacts: no  Context: stable Recurrent sinusitis: no Relief with OTC cold/cough medications: no  Treatments attempted: coricetin, singulair   Has been having a lot of pain in her knees, mainly at night. Tried salonpas without much benefit. Has not had an x-ray in a long time.   Relevant past medical, surgical, family and social history reviewed and updated as indicated. Interim medical history since our last visit reviewed. Allergies and medications reviewed and updated.  Review of Systems  Constitutional: Negative.   HENT: Positive for congestion, ear pain, postnasal drip, rhinorrhea, sinus pressure, sneezing and sore throat. Negative for dental problem, drooling, ear discharge, facial swelling, hearing loss, mouth sores, nosebleeds, sinus pain, tinnitus, trouble swallowing and voice change.   Respiratory: Positive for cough, shortness of breath and wheezing. Negative for apnea, choking, chest tightness and stridor.   Cardiovascular: Negative.   Gastrointestinal: Negative.   Musculoskeletal: Positive for back pain and myalgias. Negative for arthralgias, gait problem, joint swelling, neck pain and neck stiffness.  Skin: Negative.   Psychiatric/Behavioral: Negative.     Per HPI unless specifically indicated above     Objective:    BP 108/72   Pulse 87   Temp (!) 97.3 F (36.3 C) (Oral)   Wt 213 lb (96.6 kg)   SpO2 97%   BMI 34.38 kg/m   Wt Readings from Last 3 Encounters:  02/19/18 213 lb (96.6 kg)  12/18/17 215 lb (97.5 kg)  11/21/17 216 lb (98 kg)    Physical Exam  Constitutional: She is oriented to person,  place, and time. She appears well-developed and well-nourished. No distress.  HENT:  Head: Normocephalic and atraumatic.  Right Ear: Hearing, tympanic membrane and ear canal normal. No drainage, swelling or tenderness. No middle ear effusion.  Left Ear: Hearing, tympanic membrane  and ear canal normal. No drainage, swelling or tenderness.  No middle ear effusion.  Nose: Nose normal.  Mouth/Throat: Uvula is midline and mucous membranes are normal. Mucous membranes are not pale. No oral lesions. No uvula swelling. Posterior oropharyngeal erythema present. No oropharyngeal exudate, posterior oropharyngeal edema or tonsillar abscesses.  Eyes: Pupils are equal, round, and reactive to light. Conjunctivae, EOM and lids are normal. Right eye exhibits no discharge. Left eye exhibits no discharge. No scleral icterus.  Neck: Normal range of motion. Neck supple. No thyromegaly present.  Cardiovascular: Normal rate, regular rhythm, normal heart sounds and intact distal pulses. Exam reveals no gallop and no friction rub.  No murmur heard. Pulmonary/Chest: Effort normal and breath sounds normal. No stridor. No respiratory distress. She has no wheezes. She has no rhonchi. She has no rales. She exhibits no tenderness.  Musculoskeletal: Normal range of motion.  Lymphadenopathy:    She has cervical adenopathy.  Neurological: She is alert and oriented to person, place, and time.  Skin: Skin is warm, dry and intact. Capillary refill takes less than 2 seconds. No rash noted. She is not diaphoretic. No erythema. No pallor.  Psychiatric: She has a normal mood and affect. Her speech is normal and behavior is normal. Judgment and thought content normal. Cognition and memory are normal.  Nursing note and vitals reviewed.   Results for orders placed or performed in visit on 11/21/17  Microscopic Examination  Result Value Ref Range   WBC, UA >30 (H) 0 - 5 /hpf   RBC, UA 11-30 (A) 0 - 2 /hpf   Epithelial Cells (non renal) >10 (H) 0 - 10 /hpf   Casts Present (A) None seen /lpf   Cast Type Granular casts (A) N/A   Mucus, UA Present (A) Not Estab.   Bacteria, UA Many (A) None seen/Few  Urinalysis, Complete  Result Value Ref Range   Specific Gravity, UA 1.025 1.005 - 1.030   pH, UA 5.5 5.0 - 7.5     Color, UA Yellow Yellow   Appearance Ur Cloudy (A) Clear   Leukocytes, UA 3+ (A) Negative   Protein, UA 3+ (A) Negative/Trace   Glucose, UA Negative Negative   Ketones, UA Negative Negative   RBC, UA 2+ (A) Negative   Bilirubin, UA Negative Negative   Urobilinogen, Ur 0.2 0.2 - 1.0 mg/dL   Nitrite, UA Negative Negative   Microscopic Examination See below:       Assessment & Plan:   Problem List Items Addressed This Visit      Respiratory   COPD (chronic obstructive pulmonary disease) ([place])    Lungs clear- will treat with burst of prednisone. Call with any concerns.       Relevant Medications   predniSONE (DELTASONE) 50 MG tablet     Endocrine   Diabetes mellitus type 2 with complications, uncontrolled ([place]) - Primary    Doing much much better with A1c down to 7.4 from 11.6- remain off metformin. Continue trulicity. Recheck 3 months. Call with any concerns.       Relevant Orders   Bayer DCA Hb A1c Waived   Hypothyroid    We will take over her thyroid medicine. Drawing labs today. Adjust dose as needed. Call with any  concerns.       Relevant Orders   TSH    Other Visit Diagnoses    Viral upper respiratory tract infection       No sign of bacterial infection. Will treat with prednisone burst. Call with any concerns.    Chronic pain of both knees       Likely arthritis. Will obtain x-rays. Await results. Treat as needed.    Relevant Medications   predniSONE (DELTASONE) 50 MG tablet   Other Relevant Orders   DG Knee Complete 4 Views Left   DG Knee Complete 4 Views Right       Follow up plan: Return in about 3 months (around 05/21/2018) for 6 month follow up.

## 2018-02-19 NOTE — Assessment & Plan Note (Signed)
Doing much much better with A1c down to 7.4 from 11.6- remain off metformin. Continue trulicity. Recheck 3 months. Call with any concerns.

## 2018-02-20 ENCOUNTER — Encounter: Payer: Self-pay | Admitting: Urology

## 2018-02-20 ENCOUNTER — Ambulatory Visit (INDEPENDENT_AMBULATORY_CARE_PROVIDER_SITE_OTHER): Payer: Medicare Other | Admitting: Urology

## 2018-02-20 VITALS — BP 107/76 | HR 80 | Ht 66.0 in | Wt 213.2 lb

## 2018-02-20 DIAGNOSIS — N390 Urinary tract infection, site not specified: Secondary | ICD-10-CM | POA: Diagnosis not present

## 2018-02-20 DIAGNOSIS — M5033 Other cervical disc degeneration, cervicothoracic region: Secondary | ICD-10-CM | POA: Diagnosis not present

## 2018-02-20 DIAGNOSIS — N1330 Unspecified hydronephrosis: Secondary | ICD-10-CM | POA: Diagnosis not present

## 2018-02-20 DIAGNOSIS — R8281 Pyuria: Secondary | ICD-10-CM

## 2018-02-20 DIAGNOSIS — R300 Dysuria: Secondary | ICD-10-CM

## 2018-02-20 DIAGNOSIS — M9901 Segmental and somatic dysfunction of cervical region: Secondary | ICD-10-CM | POA: Diagnosis not present

## 2018-02-20 DIAGNOSIS — M9902 Segmental and somatic dysfunction of thoracic region: Secondary | ICD-10-CM | POA: Diagnosis not present

## 2018-02-20 DIAGNOSIS — M5134 Other intervertebral disc degeneration, thoracic region: Secondary | ICD-10-CM | POA: Diagnosis not present

## 2018-02-20 LAB — URINALYSIS, COMPLETE
Bilirubin, UA: NEGATIVE
Glucose, UA: NEGATIVE
Ketones, UA: NEGATIVE
Nitrite, UA: NEGATIVE
Specific Gravity, UA: >1.03 — ABNORMAL HIGH (ref 1.005–1.030)
Urobilinogen, Ur: 0.2 mg/dL (ref 0.2–1.0)
pH, UA: 5.5 (ref 5.0–7.5)

## 2018-02-20 LAB — TSH: TSH: 0.914 u[IU]/mL (ref 0.450–4.500)

## 2018-02-20 LAB — MICROSCOPIC EXAMINATION
Epithelial Cells (non renal): >10 /hpf — ABNORMAL HIGH (ref 0–10)
WBC, UA: >30 /hpf — ABNORMAL HIGH (ref 0–5)

## 2018-02-20 NOTE — Progress Notes (Signed)
02/20/2018 9:00 AM   Tammy [name] 10/16/1945 [id number]  Referring provider: [name], DO [place], [place] [address]  Chief Complaint  Patient presents with   Recurrent UTI    HPI: Tammy [name] is a 72 yo F with a history of pyuria and rUTIs presents today for a 3 month follow up.   At her last visit she had noted significant decrease in her urinary symptoms and UTIs after starting Trulicity.  She continues to do well.  Her UA today shows >30 WBC and >10 epithelial cells, otherwise unremarkable.    PMH: Past Medical History:  Diagnosis Date   Asthma    Benign neoplasm of colon    Cervical disc disease    "bulging" - no limitations per pt   Chronic diastolic CHF (congestive heart failure) ([place])    a. 07/2012 Echo: Nl EF. mild inferoseptal HK, Gr 2 DD, mild conc LVH, mild PR/TR, mild PAH.   CKD (chronic kidney disease), stage III (HCC)    COPD (chronic obstructive pulmonary disease) (HCC)    Coronary artery disease    a. 11/2011 Cath: LAD 46md, LCX min irregs, RCA 70p, 1087mith L->R collats, EF 60%-->Med Rx.   Diabetes mellitus without complication (HCC)    Fusion of lumbar spine 12/22/2015   GERD (gastroesophageal reflux disease)    History of DVT (deep vein thrombosis)    History of tobacco abuse    a. Quit 2011.   Hyperlipidemia    Hypertension    Hypothyroidism    Obesity    Palpitations    PE (pulmonary embolism) 10/15   PONV (postoperative nausea and vomiting)    PVD (peripheral vascular disease) ([facility]   a. PTA of right leg and left femoral artery with stenosis.   [facility]   TIAs - no deficits    Surgical History: Past Surgical History:  Procedure Laterality Date   ANGIOPLASTY / STENTING FEMORAL     PVD; angioplasty right leg and left femoral artery with stenosis.    APPENDECTOMY  Oct. 2016   BACK SURGERY  03/2012   BACK SURGERY     screws, rods replaced with new hardware.   BACK SURGERY  11/2016   CARDIAC  CATHETERIZATION  12/07/2011   Mid LAD 50%, distal LAD 50%, mid RCA 70%, distal RCA 100%    CARDIAC CATHETERIZATION  01/04/2011   100% occluded mid RCA with good collaterals from distal LAD, normal LVEF.   CHOLECYSTECTOMY     COLONOSCOPY  2013   COLONOSCOPY WITH PROPOFOL N/A 05/24/2015   Procedure: COLONOSCOPY WITH PROPOFOL;  Surgeon: DaLucilla LameMD;  [facility];  Laterality: N/A;  Diabetic - oral meds   LAPAROSCOPIC APPENDECTOMY N/A 01/06/2015   Procedure: APPENDECTOMY LAPAROSCOPIC;  Surgeon: SeChristene LyeMD;  Location: [facility];  Service: General;  Laterality: N/A;   POLYPECTOMY  05/24/2015   Procedure: POLYPECTOMY;  Surgeon: DaLucilla LameMD;  [facility]: End[facility];;   THYROIDECTOMY     TRIGGER FINGER RELEASE      Home Medications:  Allergies as of 02/20/2018      Reactions   Hydrocodone Other (See Comments)   Unresponsive   Aleve [naproxen Sodium]    Hives & itching    Contrast Media [iodinated Diagnostic Agents]    Pt avoids due to kidney issues   Ioxaglate Other (See Comments)   (iodine) Pt avoids due to kidney issues  Oxycodone Other (See Comments)   hallucinations   Ranexa [ranolazine]    Sick on stomach   Sulfa Antibiotics Other (See Comments)   Pt avoids due to kidney issues   Tramadol Other (See Comments)   nightmares      Medication List       Accurate as of February 20, 2018 11:59 PM. Always use your most recent med list.        albuterol 108 (90 Base) MCG/ACT inhaler Commonly known as:  PROAIR HFA INHALE 2 PUFFS EVERY 4 HOURS AS NEEDED FOR WHEEZING OR SHORTNESS OF BREATH   apixaban 5 MG Tabs tablet Commonly known as:  ELIQUIS Take 1 tablet (5 mg total) by mouth 2 (two) times daily.   blood glucose meter kit and supplies Kit Dispense based on patient and insurance preference. Use one time daily as directed, E11.9   Dulaglutide 1.5 MG/0.5ML Sopn Commonly known as:   TRULICITY Inject 1.5 mg into the skin once a week.   DULoxetine 60 MG capsule Commonly known as:  CYMBALTA Take 1 capsule (60 mg total) by mouth daily.   gabapentin 300 MG capsule Commonly known as:  NEURONTIN Take 1 capsule (300 mg total) by mouth 3 (three) times daily.   glipiZIDE 5 MG tablet Commonly known as:  GLUCOTROL TAKE ONE TABLET EVERY DAY BEFORE BREAKFAST   isosorbide mononitrate 60 MG 24 hr tablet Commonly known as:  IMDUR Take 1 tablet (60 mg total) by mouth as directed. Take 1 tablet at bedtime and 1/2 tablet in the morning   levothyroxine 150 MCG tablet Commonly known as:  SYNTHROID Take 1 tablet (150 mcg total) by mouth daily before breakfast.   losartan 50 MG tablet Commonly known as:  COZAAR Take 1 tablet (50 mg total) by mouth daily.   metoprolol succinate 50 MG 24 hr tablet Commonly known as:  TOPROL-XL TAKE 1 TABLET BY MOUTH DAILY WITH OR IMMEDIATLEY FOLLOWING A MEAL   nitroGLYCERIN 0.4 MG SL tablet Commonly known as:  NITROSTAT Place 1 tablet (0.4 mg total) under the tongue every 5 (five) minutes as needed for chest pain.   pantoprazole 40 MG tablet Commonly known as:  PROTONIX TAKE ONE TABLET BY MOUTH EVERY DAY AS NEEDED   predniSONE 50 MG tablet Commonly known as:  DELTASONE Take 1 tablet (50 mg total) by mouth daily with breakfast.   rosuvastatin 40 MG tablet Commonly known as:  CRESTOR Take 1 tablet (40 mg total) by mouth daily.   trimethoprim 100 MG tablet Commonly known as:  TRIMPEX Take 1 tablet (100 mg total) by mouth daily.       Allergies:  Allergies  Allergen Reactions   Hydrocodone Other (See Comments)    Unresponsive   Aleve [Naproxen Sodium]     Hives & itching    Contrast Media [Iodinated Diagnostic Agents]     Pt avoids due to kidney issues   Ioxaglate Other (See Comments)    (iodine) Pt avoids due to kidney issues    Oxycodone Other (See Comments)    hallucinations   Ranexa [Ranolazine]     Sick on  stomach   Sulfa Antibiotics Other (See Comments)    Pt avoids due to kidney issues   Tramadol Other (See Comments)    nightmares    Family History: Family History  Problem Relation Age of Onset   Hypertension Father    Heart disease Father    Heart attack Father    Diabetes Father  Cancer Mother        colon   Heart attack Brother    Diabetes Brother    Hypertension Brother    Heart disease Brother    Heart attack Brother    Diabetes Brother    Hypertension Brother    Heart disease Brother    Heart attack Brother    Diabetes Brother    Hypertension Brother    Heart disease Brother    Cancer Sister        lung   COPD Sister    COPD Sister     Social History:  reports that she quit smoking about 8 years ago. Her smoking use included cigarettes. She has a 7.50 pack-year smoking history. She has never used smokeless tobacco. She reports that she does not drink alcohol or use drugs.  ROS: UROLOGY Frequent Urination?: No Hard to postpone urination?: No Burning/pain with urination?: No Get up at night to urinate?: No Leakage of urine?: No Urine stream starts and stops?: No Trouble starting stream?: No Do you have to strain to urinate?: No Blood in urine?: No Urinary tract infection?: No Sexually transmitted disease?: No Injury to kidneys or bladder?: No Painful intercourse?: No Weak stream?: No Currently pregnant?: No Vaginal bleeding?: No Last menstrual period?: Hysterectomy  Gastrointestinal Nausea?: No Vomiting?: No Indigestion/heartburn?: No Diarrhea?: No Constipation?: No  Constitutional Fever: No Night sweats?: No Weight loss?: No Fatigue?: No  Skin Skin rash/lesions?: No Itching?: No  Eyes Blurred vision?: No Double vision?: No  Ears/Nose/Throat Sore throat?: No Sinus problems?: Yes  Hematologic/Lymphatic Swollen glands?: No Easy bruising?: No  Cardiovascular Leg swelling?: No Chest pain?:  No  Respiratory Cough?: No Shortness of breath?: No  Endocrine Excessive thirst?: No  Musculoskeletal Back pain?: No Joint pain?: No  Neurological Headaches?: No Dizziness?: No  Psychologic Depression?: No Anxiety?: No  Physical Exam: BP 107/76 (BP Location: Left Arm, Patient Position: Sitting, Cuff Size: Large)    Pulse 80    Ht 5' 6"  (1.676 m)    Wt 213 lb 3.2 oz (96.7 kg)    BMI 34.41 kg/m   Constitutional:  Alert and oriented, No acute distress. HEENT: [place] AT, moist mucus membranes.  Trachea midline, no masses. Cardiovascular: No clubbing, cyanosis, or edema. Respiratory: Normal respiratory effort, no increased work of breathing. Skin: No rashes, bruises or suspicious lesions. Neurologic: Grossly intact, no focal deficits, moving all 4 extremities. Psychiatric: Normal mood and affect.  Urinalysis Her UA is >30 WBC and >10 epithelial cell, otherwise unremarkable.   Assessment & Plan:   1. rUTIs Although urinalysis today does show pyuria there are >10 epithelial cells indicating a contaminated specimen.  She is asymptomatic. Return in about 6 months (around 08/22/2018) for f/u    [name], MD  [facility] [address], [place], [place] [address] [phone]  I, Lucas Mallow, am acting as a scribe for Dr. Nicki Reaper C. Stoioff,  I, [name], MD, have reviewed all documentation for this visit. The documentation on 02/21/18 for the exam, diagnosis, procedures, and orders are all accurate and complete.

## 2018-02-21 ENCOUNTER — Encounter: Payer: Self-pay | Admitting: Urology

## 2018-02-21 ENCOUNTER — Other Ambulatory Visit: Payer: Self-pay | Admitting: Family Medicine

## 2018-02-21 MED ORDER — LEVOTHYROXINE SODIUM 150 MCG PO TABS: 150.0000 ug | ORAL_TABLET | Freq: Every day | ORAL | 3 refills | Status: DC

## 2018-02-25 ENCOUNTER — Ambulatory Visit
Admission: RE | Admit: 2018-02-25 | Discharge: 2018-02-25 | Disposition: A | Discharge status: Home / Self Care | Payer: Medicare Other | Source: Ambulatory Visit | Attending: Family Medicine | Admitting: Family Medicine

## 2018-02-25 DIAGNOSIS — G8929 Other chronic pain: Secondary | ICD-10-CM | POA: Insufficient documentation

## 2018-02-25 DIAGNOSIS — M1711 Unilateral primary osteoarthritis, right knee: Secondary | ICD-10-CM | POA: Diagnosis not present

## 2018-02-25 DIAGNOSIS — E119 Type 2 diabetes mellitus without complications: Secondary | ICD-10-CM | POA: Diagnosis not present

## 2018-02-25 DIAGNOSIS — M25561 Pain in right knee: Secondary | ICD-10-CM | POA: Diagnosis not present

## 2018-02-25 DIAGNOSIS — M25562 Pain in left knee: Principal | ICD-10-CM

## 2018-02-25 DIAGNOSIS — M1712 Unilateral primary osteoarthritis, left knee: Secondary | ICD-10-CM | POA: Diagnosis not present

## 2018-02-25 LAB — HM DIABETES EYE EXAM

## 2018-02-27 ENCOUNTER — Other Ambulatory Visit: Payer: Self-pay | Admitting: Family Medicine

## 2018-02-27 MED ORDER — ALBUTEROL SULFATE HFA 108 (90 BASE) MCG/ACT IN AERS: INHALATION_SPRAY | RESPIRATORY_TRACT | 3 refills | Status: DC

## 2018-02-27 NOTE — Telephone Encounter (Signed)
Called and notified patient of results per Dr. Wynetta Emery. Patient stated that she wants to think about this for a while. While on the phone, patient asked if she could have a refill of her albuterol sent to [facility].

## 2018-02-27 NOTE — Telephone Encounter (Signed)
Please let her know that her x-rays showed some moderate arthritis in her knees. We can get her over to see the orthopedist to talk about options if she would like. Thanks!

## 2018-03-11 ENCOUNTER — Telehealth: Payer: Self-pay | Admitting: Cardiovascular Disease

## 2018-03-11 ENCOUNTER — Emergency Department: Payer: Medicare Other

## 2018-03-11 ENCOUNTER — Other Ambulatory Visit: Payer: Self-pay

## 2018-03-11 ENCOUNTER — Emergency Department
Admission: EM | Admit: 2018-03-11 | Discharge: 2018-03-11 | Disposition: A | Discharge status: Home / Self Care | Payer: Medicare Other | Attending: Emergency Medicine | Admitting: Emergency Medicine

## 2018-03-11 ENCOUNTER — Encounter: Payer: Self-pay | Admitting: Emergency Medicine

## 2018-03-11 DIAGNOSIS — R079 Chest pain, unspecified: Secondary | ICD-10-CM | POA: Diagnosis not present

## 2018-03-11 DIAGNOSIS — Z8673 Personal history of transient ischemic attack (TIA), and cerebral infarction without residual deficits: Secondary | ICD-10-CM | POA: Diagnosis not present

## 2018-03-11 DIAGNOSIS — R0789 Other chest pain: Secondary | ICD-10-CM

## 2018-03-11 DIAGNOSIS — R61 Generalized hyperhidrosis: Secondary | ICD-10-CM | POA: Diagnosis not present

## 2018-03-11 DIAGNOSIS — J45909 Unspecified asthma, uncomplicated: Secondary | ICD-10-CM | POA: Diagnosis not present

## 2018-03-11 DIAGNOSIS — Z87891 Personal history of nicotine dependence: Secondary | ICD-10-CM | POA: Insufficient documentation

## 2018-03-11 DIAGNOSIS — R0602 Shortness of breath: Secondary | ICD-10-CM | POA: Diagnosis not present

## 2018-03-11 DIAGNOSIS — Z9049 Acquired absence of other specified parts of digestive tract: Secondary | ICD-10-CM | POA: Diagnosis not present

## 2018-03-11 DIAGNOSIS — I2510 Atherosclerotic heart disease of native coronary artery without angina pectoris: Secondary | ICD-10-CM | POA: Insufficient documentation

## 2018-03-11 DIAGNOSIS — I5032 Chronic diastolic (congestive) heart failure: Secondary | ICD-10-CM | POA: Diagnosis not present

## 2018-03-11 DIAGNOSIS — N183 Chronic kidney disease, stage 3 (moderate): Secondary | ICD-10-CM | POA: Diagnosis not present

## 2018-03-11 DIAGNOSIS — I130 Hypertensive heart and chronic kidney disease with heart failure and stage 1 through stage 4 chronic kidney disease, or unspecified chronic kidney disease: Secondary | ICD-10-CM | POA: Diagnosis not present

## 2018-03-11 DIAGNOSIS — E1122 Type 2 diabetes mellitus with diabetic chronic kidney disease: Secondary | ICD-10-CM | POA: Diagnosis not present

## 2018-03-11 DIAGNOSIS — E039 Hypothyroidism, unspecified: Secondary | ICD-10-CM | POA: Insufficient documentation

## 2018-03-11 DIAGNOSIS — R42 Dizziness and giddiness: Secondary | ICD-10-CM | POA: Diagnosis not present

## 2018-03-11 HISTORY — DX: Disorder of kidney and ureter, unspecified: N28.9

## 2018-03-11 LAB — BASIC METABOLIC PANEL
Anion gap: 9 (ref 5–15)
BUN: 26 mg/dL — ABNORMAL HIGH (ref 8–23)
CO2: 25 mmol/L (ref 22–32)
Calcium: 8.9 mg/dL (ref 8.9–10.3)
Chloride: 104 mmol/L (ref 98–111)
Creatinine, Ser: 1.36 mg/dL — ABNORMAL HIGH (ref 0.44–1.00)
GFR calc Af Amer: 44 mL/min — ABNORMAL LOW (ref >60)
GFR calc non Af Amer: 38 mL/min — ABNORMAL LOW (ref >60)
Glucose, Bld: 186 mg/dL — ABNORMAL HIGH (ref 70–99)
Potassium: 5.2 mmol/L — ABNORMAL HIGH (ref 3.5–5.1)
Sodium: 138 mmol/L (ref 135–145)

## 2018-03-11 LAB — TROPONIN I
Troponin I: <0.03 ng/mL (ref <0.03)
Troponin I: <0.03 ng/mL (ref <0.03)

## 2018-03-11 LAB — CBC
HCT: 44 % (ref 36.0–46.0)
Hemoglobin: 13.5 g/dL (ref 12.0–15.0)
MCH: 28.4 pg (ref 26.0–34.0)
MCHC: 30.7 g/dL (ref 30.0–36.0)
MCV: 92.4 fL (ref 80.0–100.0)
Platelets: 278 10*3/uL (ref 150–400)
RBC: 4.76 MIL/uL (ref 3.87–5.11)
RDW: 13.5 % (ref 11.5–15.5)
WBC: 9.2 10*3/uL (ref 4.0–10.5)
nRBC: 0 % (ref 0.0–0.2)

## 2018-03-11 LAB — FIBRIN DERIVATIVES D-DIMER (ARMC ONLY): Fibrin derivatives D-dimer (ARMC): 811.41 ng/mL (FEU) — ABNORMAL HIGH (ref 0.00–499.00)

## 2018-03-11 MED ORDER — IOHEXOL 350 MG/ML SOLN
60.0000 mL | Freq: Once | INTRAVENOUS | Status: AC | PRN
  Administered 2018-03-11: 60 mL via INTRAVENOUS
  Filled 2018-03-11: qty 60

## 2018-03-11 MED ORDER — SODIUM CHLORIDE 0.9 % IV SOLN
Freq: Once | INTRAVENOUS | Status: AC
  Administered 2018-03-11: 16:00:00 via INTRAVENOUS

## 2018-03-11 NOTE — ED Notes (Signed)
Shortness of breath with chest pressure when lying down, Dr Jimmye Norman in to assess.  Drawing repeat troponin and d-dimer.

## 2018-03-11 NOTE — ED Triage Notes (Signed)
Short of breath and says when she lays back there is pressure in upper mid chest that is a dull pain. This started christmas night.  Increases and feels hot and sweats when she walks also feels dizzy then.

## 2018-03-11 NOTE — Telephone Encounter (Signed)
Pt reports dull R anterior chest pain that radiates to R jaw that started on 03/05/18 that worsens when she lies flat.   She reports SOB and sweating with walking.  No SOB noted on phone, pt speaks in full sentences.   Pt encouraged to seek medical attention d/t concerning sx. Pt agreed that she would get a ride to the ED.  Advised pt to call for any further questions or concerns

## 2018-03-11 NOTE — ED Provider Notes (Signed)
[facility] Emergency Department Provider Note       Time seen: ----------------------------------------- 2:33 PM on 03/11/2018 -----------------------------------------   I have reviewed the triage vital signs and the nursing notes.  HISTORY   Chief Complaint Shortness of Breath and Chest Pain    HPI Tammy [name] is a 72 y.o. female with a history of asthma, CHF, chronic kidney disease, COPD, coronary disease, diabetes, hypertension, PE who presents to the ED for shortness of breath when she lays back.  She also reports this pressure on her mid chest that is dull.  This pain started around Christmas night.  She feels hot and sweats when she walks.  Also feels dizzy when she is up walking about.  She was sent by her doctor for further evaluation.  Past Medical History:  Diagnosis Date  . Asthma   . Benign neoplasm of colon   . Cervical disc disease    "bulging" - no limitations per pt  . Chronic diastolic CHF (congestive heart failure) ([place])    a. 07/2012 Echo: Nl EF. mild inferoseptal HK, Gr 2 DD, mild conc LVH, mild PR/TR, mild PAH.  . CKD (chronic kidney disease), stage III ([place])   . COPD (chronic obstructive pulmonary disease) ([place])   . Coronary artery disease    a. 11/2011 Cath: LAD 49m/d, LCX min irregs, RCA 70p, 125m with L->R collats, EF 60%-->Med Rx.  . Diabetes mellitus without complication ([place])   . Fusion of lumbar spine 12/22/2015  . GERD (gastroesophageal reflux disease)   . History of DVT (deep vein thrombosis)   . History of tobacco abuse    a. Quit 2011.  [name] Hyperlipidemia   . Hypertension   . Hypothyroidism   . Obesity   . Palpitations   . PE (pulmonary embolism) 10/15  . PONV (postoperative nausea and vomiting)   . PVD (peripheral vascular disease) ([place])    a. PTA of right leg and left femoral artery with stenosis.  . Stroke ([place])    TIAs - no deficits    Patient Active Problem List   Diagnosis Date Noted  . Right knee pain  08/01/2017  . Chronic right-sided low back pain with right-sided sciatica 05/28/2017  . Gait abnormality 05/28/2017  . Hypothyroid 07/18/2016  . COPD (chronic obstructive pulmonary disease) ([place]) 07/18/2016  . Encephalomalacia on imaging study 12/10/2015  . Calcification of both carotid arteries 12/10/2015  . Renal failure (ARF), acute on chronic (HCC) 11/22/2015  . Benign neoplasm of sigmoid colon   . Benign neoplasm of cecum   . Benign neoplasm of ascending colon   . Bilateral atelectasis 04/11/2015  . Fatty liver disease, nonalcoholic 40/98/1191  . Hydronephrosis of right kidney 04/11/2015  . Abdominal aortic ectasia ([place]) 04/11/2015  . Abdominal aortic atherosclerosis ([place]) 12/18/2014  . Diverticulosis of colon 12/18/2014  . Angina pectoris ([place]) 11/18/2014  . Rhinitis, allergic 10/20/2014  . Coronary artery disease   . Obesity   . Spinal stenosis of lumbar region 06/06/2014  . Herniated nucleus pulposus 06/06/2014  . Neuralgia neuritis, sciatic nerve 05/24/2014  . Acute pulmonary embolism ([place]) 01/30/2014  . Benign hypertensive renal disease 12/16/2013  . Diabetes mellitus type 2 with complications, uncontrolled ([place]) 12/16/2013  . Chronic renal insufficiency, stage III (moderate) ([place]) 12/16/2013  . Frequent UTI 11/03/2013  . Detrusor muscle hypertonia 11/03/2013  . Coronary atherosclerosis of native coronary artery 06/19/2013  . History of back surgery 06/19/2013  . Hyperlipidemia 06/19/2013  . PAD (peripheral artery disease) (  [place]) 06/19/2013  . Congenital obstruction of ureteropelvic junction 11/21/2011    Past Surgical History:  Procedure Laterality Date  . ANGIOPLASTY / STENTING FEMORAL     PVD; angioplasty right leg and left femoral artery with stenosis.   . APPENDECTOMY  Oct. 2016  . BACK SURGERY  03/2012  . BACK SURGERY     screws, rods replaced with new hardware.  [name] BACK SURGERY  11/2016  . CARDIAC CATHETERIZATION  12/07/2011   Mid LAD 50%, distal LAD 50%,  mid RCA 70%, distal RCA 100%   . CARDIAC CATHETERIZATION  01/04/2011   100% occluded mid RCA with good collaterals from distal LAD, normal LVEF.  [name] CHOLECYSTECTOMY    . COLONOSCOPY  2013  . COLONOSCOPY WITH PROPOFOL N/A 05/24/2015   Procedure: COLONOSCOPY WITH PROPOFOL;  Surgeon: [name], MD;  Location: [place];  Service: Endoscopy;  Laterality: N/A;  Diabetic - oral meds  . LAPAROSCOPIC APPENDECTOMY N/A 01/06/2015   Procedure: APPENDECTOMY LAPAROSCOPIC;  Surgeon: [name], MD;  Location: [facility];  Service: General;  Laterality: N/A;  . POLYPECTOMY  05/24/2015   Procedure: POLYPECTOMY;  Surgeon: [name], MD;  Location: [place];  Service: Endoscopy;;  . THYROIDECTOMY    . TRIGGER FINGER RELEASE      Allergies Hydrocodone; Aleve [naproxen sodium]; Contrast media [iodinated diagnostic agents]; Ioxaglate; Oxycodone; Ranexa [ranolazine]; Sulfa antibiotics; and Tramadol  Social History Social History   Tobacco Use  . Smoking status: Former Smoker    Packs/day: 0.50    Years: 15.00    Pack years: 7.50    Types: Cigarettes    Last attempt to quit: 03/13/2009    Years since quitting: 9.0  . Smokeless tobacco: Never Used  Substance Use Topics  . Alcohol use: No    Alcohol/week: 0.0 standard drinks  . Drug use: No   Review of Systems Constitutional: Negative for fever. Cardiovascular: Positive for chest discomfort Respiratory: Positive for shortness of breath Gastrointestinal: Negative for abdominal pain, vomiting and diarrhea. Genitourinary: Negative for dysuria. Musculoskeletal: Negative for back pain. Skin: Positive for sweats Neurological: Positive for dizziness  All systems negative/normal/unremarkable except as stated in the HPI  ____________________________________________   PHYSICAL EXAM:  VITAL SIGNS: ED Triage Vitals  Enc Vitals Group     BP 03/11/18 1141 130/65     Pulse Rate 03/11/18 1141 69     Resp 03/11/18 1141 18      Temp 03/11/18 1141 97.7 F (36.5 C)     Temp Source 03/11/18 1141 Oral     SpO2 03/11/18 1141 97 %     Weight 03/11/18 1135 213 lb 3 oz (96.7 kg)     Height 03/11/18 1135 5\' 6"  (1.676 m)     Head Circumference --      Peak Flow --      Pain Score 03/11/18 1135 3     Pain Loc --      Pain Edu? --      Excl. in [place]? --     Constitutional: Alert and oriented. Well appearing and in no distress. Eyes: Conjunctivae are normal. Normal extraocular movements. ENT   Head: Normocephalic and atraumatic.   Nose: No congestion/rhinnorhea.   Mouth/Throat: Mucous membranes are moist.   Neck: No stridor. Cardiovascular: Normal rate, regular rhythm. No murmurs, rubs, or gallops. Respiratory: Normal respiratory effort without tachypnea nor retractions. Breath sounds are clear and equal bilaterally. No wheezes/rales/rhonchi. Gastrointestinal: Soft and nontender. Normal bowel sounds Musculoskeletal: Nontender with  normal range of motion in extremities. No lower extremity tenderness nor edema. Neurologic:  Normal speech and language. No gross focal neurologic deficits are appreciated.  Skin:  Skin is warm, dry and intact. No rash noted. Psychiatric: Mood and affect are normal. Speech and behavior are normal.  ____________________________________________  EKG: Interpreted by me.  Sinus rhythm the rate of 73 bpm, left axis deviation, possible septal infarct age-indeterminate, normal QT  ____________________________________________  ED COURSE:  As part of my medical decision making, I reviewed the following data within the [place] History obtained from family if available, nursing notes, old chart and ekg, as well as notes from prior ED visits. Patient presented for shortness of breath with nonspecific chest pain, we will assess with labs and imaging as indicated at this time.   Procedures ____________________________________________   LABS (pertinent  positives/negatives)  Labs Reviewed  BASIC METABOLIC PANEL - Abnormal; Notable for the following components:      Result Value   Potassium 5.2 (*)    Glucose, Bld 186 (*)    BUN 26 (*)    Creatinine, Ser 1.36 (*)    GFR calc non Af Amer 38 (*)    GFR calc Af Amer 44 (*)    All other components within normal limits  FIBRIN DERIVATIVES D-DIMER ([facility] ONLY) - Abnormal; Notable for the following components:   Fibrin derivatives D-dimer ([facility]) 811.41 (*)    All other components within normal limits  CBC  TROPONIN I  TROPONIN I    RADIOLOGY  Chest x-ray is unremarkable CTA chest was negative for PE ____________________________________________  DIFFERENTIAL DIAGNOSIS   Unstable angina, PE, MI, musculoskeletal pain, GERD, anxiety  FINAL ASSESSMENT AND PLAN  Nonspecific chest pain   Plan: The patient had presented for nonspecific chest pain. Patient's labs are unremarkable except for d-dimer. Patient's imaging was also negative including CT angiogram for PE.  She will be referred to cardiology for outpatient follow-up and either stress testing or consideration for cardiac catheterization.   Laurence Aly, MD   Note: This note was generated in part or whole with voice recognition software. Voice recognition is usually quite accurate but there are transcription errors that can and very often do occur. I apologize for any typographical errors that were not detected and corrected.     Earleen Newport, MD 03/11/18 [phone]

## 2018-03-11 NOTE — Telephone Encounter (Signed)
Pt c/o of Chest Pain: STAT if CP now or developed within 24 hours  1. Are you having CP right now? Yes dull ache   2. Are you experiencing any other symptoms (ex. SOB, nausea, vomiting, sweating)? Sob sweating   3. How long have you been experiencing CP? 1 wk   4. Is your CP continuous or coming and going?  Most of the time   5. Have you taken Nitroglycerin? No  ?

## 2018-03-13 NOTE — Telephone Encounter (Signed)
Given chest pain would recommend pharmacological Myoview Was seen in the emergency room and released On last clinic visit in early 2019 we had recommended Myoview but she had declined Was having on and off symptoms of chest discomfort, some atypical component

## 2018-03-14 NOTE — Telephone Encounter (Signed)
Call to patient to discuss note from Dr. [name]. She did report that she went to the ED after call last week for chest pain. See epic for detail. She has f/u with [facility] on Tuesday 1/7 and will discuss further testing as requested by provider.  Advised pt to call for any further questions or concerns

## 2018-03-17 NOTE — Progress Notes (Signed)
Cardiology Office Note  Date:  03/19/2018   ID:  Tammy [name], DOB Oct 19, 1945, MRN [id number]  PCP:  [name], DO   Chief Complaint  Patient presents with  . other    Chest pain continues to have and achey chest when resting which is constant.SOB resolves with rest .Medications reviewed verbally.     HPI:  Tammy [name] is a 73 year old woman with history of  coronary artery disease, occluded mid RCA, in 2013  Mid LAD with moderate disease PAD with angioplasty of the legs bilaterally,  COPD,  chronic kidney disease diabetes type 2, uncontrolled diagnosis of PE in October 2015, started on anticoagulation, eliquis hypertension,  back surgery January 2014 with rods placed at that time,  remote smoking history stopped 5 years ago,  obesity,  deconditioned secondary to chronic back pain  who presents for routine followup of her coronary artery disease   In follow-up today she reports having relatively acute onset of shortness of breath in the past 2 weeks or so She did call our office last week, was referred to the emergency room Elevated d-dimer led to CTA chest Final report detailed no significant pulmonary embolism She reports that she has been compliant with her Eliquis 5 twice daily  She is concerned about underlying ischemia Also reports that sometimes her inhalers will help her breathing Is not taking her steroid inhaler, " it is upstairs, probably should take it"  Chronic back pain, stimulator in place On [place] Addition to the house on the bottom floor with bedroom  Previously markedly elevated hemoglobin A1c, she has worked hard to get this down.  Watching her diet more closely  Several recent back surgeries most recently March 2018  back surgery October 2017  EKG personally reviewed by myself on todays visit Shows normal sinus rhythm rate 78 bpm left axis deviation no significant ST or T wave changes  Other past medical history Previously in the emergency  room, treated for bronchial asthma. Takes her albuterol with some improvement of her symptoms. Shortness of breath symptoms have been dating back for many years.  Other past medical history  may have developed a DVT from sitting long periods of time.    prior heart catheterizations in 2012 and 2013. Catheterization in 2012 documented occluded mid RCA with left to right collaterals, 40% mid LAD disease, 40% mid circumflex disease, 30% proximal LAD disease Catheterization in September 2013 showed normal LV function, occluded mid RCA with left to right collaterals, 50% mid and distal LAD disease  Stress test September 2014 showing very small anterior wall defect likely secondary to breast attenuation artifact Echocardiogram May 2014 showing low normal ejection fraction, mild TR, mild pulmonary hypertension  History of urinary tract infection.    PMH:   has a past medical history of Asthma, Benign neoplasm of colon, Cervical disc disease, Chronic diastolic CHF (congestive heart failure) ([place]), CKD (chronic kidney disease), stage III ([place]), COPD (chronic obstructive pulmonary disease) ([place]), Coronary artery disease, Diabetes mellitus without complication ([place]), Fusion of lumbar spine (12/22/2015), GERD (gastroesophageal reflux disease), History of DVT (deep vein thrombosis), History of tobacco abuse, Hyperlipidemia, Hypertension, Hypothyroidism, Obesity, Palpitations, PE (pulmonary embolism) (10/15), PONV (postoperative nausea and vomiting), PVD (peripheral vascular disease) ([place]), Renal insufficiency, and Stroke ([place]).  PSH:    Past Surgical History:  Procedure Laterality Date  . ANGIOPLASTY / STENTING FEMORAL     PVD; angioplasty right leg and left femoral artery with stenosis.   . APPENDECTOMY  Oct. 2016  .  BACK SURGERY  03/2012  . BACK SURGERY     screws, rods replaced with new hardware.  [name] BACK SURGERY  11/2016  . CARDIAC CATHETERIZATION  12/07/2011   Mid LAD 50%, distal LAD 50%,  mid RCA 70%, distal RCA 100%   . CARDIAC CATHETERIZATION  01/04/2011   100% occluded mid RCA with good collaterals from distal LAD, normal LVEF.  [name] CHOLECYSTECTOMY    . COLONOSCOPY  2013  . COLONOSCOPY WITH PROPOFOL N/A 05/24/2015   Procedure: COLONOSCOPY WITH PROPOFOL;  Surgeon: [name], MD;  Location: [place];  Service: Endoscopy;  Laterality: N/A;  Diabetic - oral meds  . LAPAROSCOPIC APPENDECTOMY N/A 01/06/2015   Procedure: APPENDECTOMY LAPAROSCOPIC;  Surgeon: [name], MD;  Location: [facility];  Service: General;  Laterality: N/A;  . POLYPECTOMY  05/24/2015   Procedure: POLYPECTOMY;  Surgeon: [name], MD;  Location: [place];  Service: Endoscopy;;  . THYROIDECTOMY    . TRIGGER FINGER RELEASE      Current Outpatient Medications  Medication Sig Dispense Refill  . albuterol (PROAIR HFA) 108 (90 Base) MCG/ACT inhaler INHALE 2 PUFFS EVERY 4 HOURS AS NEEDED FOR WHEEZING OR SHORTNESS OF BREATH 8.5 g 3  . apixaban (ELIQUIS) 5 MG TABS tablet Take 1 tablet (5 mg total) by mouth 2 (two) times daily. 180 tablet 3  . blood glucose meter kit and supplies KIT Dispense based on patient and insurance preference. Use one time daily as directed, E11.9 1 each 0  . Dulaglutide (TRULICITY) 1.5 WL/7.9GX SOPN Inject 1.5 mg into the skin once a week. 4 pen 3  . DULoxetine (CYMBALTA) 60 MG capsule Take 1 capsule (60 mg total) by mouth daily. 90 capsule 1  . gabapentin (NEURONTIN) 300 MG capsule Take 1 capsule (300 mg total) by mouth 3 (three) times daily. 270 capsule 3  . glipiZIDE (GLUCOTROL) 5 MG tablet TAKE ONE TABLET EVERY DAY BEFORE BREAKFAST 90 tablet 1  . isosorbide mononitrate (IMDUR) 60 MG 24 hr tablet Take 1 tablet (60 mg total) by mouth as directed. Take 1 tablet at bedtime and 1/2 tablet in the morning 135 tablet 3  . levothyroxine (SYNTHROID) 150 MCG tablet Take 1 tablet (150 mcg total) by mouth daily before breakfast. 30 tablet 3  . losartan (COZAAR) 50 MG  tablet Take 1 tablet (50 mg total) by mouth daily. 90 tablet 3  . metoprolol succinate (TOPROL-XL) 50 MG 24 hr tablet TAKE 1 TABLET BY MOUTH DAILY WITH OR IMMEDIATLEY FOLLOWING A MEAL 90 tablet 3  . nitroGLYCERIN (NITROSTAT) 0.4 MG SL tablet Place 1 tablet (0.4 mg total) under the tongue every 5 (five) minutes as needed for chest pain. 25 tablet 6  . pantoprazole (PROTONIX) 40 MG tablet TAKE ONE TABLET BY MOUTH EVERY DAY AS NEEDED 90 tablet 1  . trimethoprim (TRIMPEX) 100 MG tablet Take 1 tablet (100 mg total) by mouth daily. 30 tablet 6  . rosuvastatin (CRESTOR) 40 MG tablet Take 1 tablet (40 mg total) by mouth daily. 90 tablet 3   No current facility-administered medications for this visit.      Allergies:   Hydrocodone; Aleve [naproxen sodium]; Contrast media [iodinated diagnostic agents]; Ioxaglate; Oxycodone; Ranexa [ranolazine]; Sulfa antibiotics; and Tramadol   Social History:  The patient  reports that she quit smoking about 9 years ago. Her smoking use included cigarettes. She has a 7.50 pack-year smoking history. She has never used smokeless tobacco. She reports that she does not drink alcohol or use  drugs.   Family History:   family history includes COPD in her sister and sister; Cancer in her mother and sister; Diabetes in her brother, brother, brother, and father; Heart attack in her brother, brother, brother, and father; Heart disease in her brother, brother, brother, and father; Hypertension in her brother, brother, brother, and father.    Review of Systems: Review of Systems  Constitutional: Negative.   Respiratory: Positive for shortness of breath.   Cardiovascular: Positive for chest pain.  Gastrointestinal: Negative.   Musculoskeletal: Positive for back pain.  Neurological: Negative.   Psychiatric/Behavioral: Negative.   All other systems reviewed and are negative.    PHYSICAL EXAM: VS:  BP 118/68 (BP Location: Left Arm, Patient Position: Sitting, Cuff Size: Large)    Pulse 78   Ht 5' 6"  (1.676 m)   Wt 214 lb (97.1 kg)   SpO2 95%   BMI 34.54 kg/m  , BMI Body mass index is 34.54 kg/m. Constitutional:  oriented to person, place, and time. No distress.  Obese HENT:  Head: Grossly normal Eyes:  no discharge. No scleral icterus.  Neck: No JVD, no carotid bruits  Cardiovascular: Regular rate and rhythm, no murmurs appreciated Pulmonary/Chest: Clear to auscultation bilaterally, no wheezes or rails Abdominal: Soft.  no distension.  no tenderness.  Musculoskeletal: Normal range of motion Neurological:  normal muscle tone. Coordination normal. No atrophy Skin: Skin warm and dry Psychiatric: normal affect, pleasant   Recent Labs: 11/13/2017: ALT 5 02/19/2018: TSH 0.914 03/11/2018: BUN 26; Creatinine, Ser 1.36; Hemoglobin 13.5; Platelets 278; Potassium 5.2; Sodium 138    Lipid Panel Lab Results  Component Value Date   CHOL 119 11/13/2017   HDL 36 (L) 11/13/2017   LDLCALC 37 11/13/2017   TRIG 232 (H) 11/13/2017    Wt Readings from Last 3 Encounters:  03/19/18 214 lb (97.1 kg)  03/11/18 210 lb (95.3 kg)  02/20/18 213 lb 3.2 oz (96.7 kg)     ASSESSMENT AND PLAN:  Atherosclerosis of native coronary artery of native heart without angina pectoris - Plan: EKG 12-Lead Worsening shortness of breath/chest tightness with exertion concerning for angina.  Recent CT scan chest with no PE Discussed various treatment options with her, we have recommended pharmacologic Myoview as she is unable to treadmill Discussed previous cardiac catheterization images with her There was 50% mid and distal LAD disease and occluded mid RCA with collaterals from left to right.  Able to exclude progression of her LAD disease causing her symptoms -If symptoms get worse recommend she go to the emergency room, she might need cardiac catheterization  Mixed hyperlipidemia Cholesterol is at goal on the current lipid regimen. No changes to the medications were made.   Stable  Essential hypertension Blood pressure is well controlled on today's visit. No changes made to the medications.  Stable  PAD (peripheral artery disease) (HCC) Lipids at goal Stressed importance of aggressive diabetes control  Abdominal aortic atherosclerosis (HCC) Cholesterol at goal, as above needs aggressive diabetes control  Chronic midline back pain, unspecified back location  spinal cord stimulator in place,  Deconditioning, shortness of breath on exertion  Uncontrolled type 2 diabetes mellitus with complication, without long-term current use of insulin (HCC) hemoglobin A1c 7.4 down from 19.6 On Trulicity  Chronic renal insufficiency, stage III (moderate) Secondary to underlying diabetes Creatinine 1.36, stable.  Previously 2.36 in September 2019   Total encounter time more than 25 minutes  Greater than 50% was spent in counseling and coordination of care with the  patient   We will call her with the results of her stress test Disposition:   F/U  12 months   Orders Placed This Encounter  Procedures  . NM Myocar Multi W/Spect W/Wall Motion / EF     Signed, Esmond Plants, M.D., Ph.D. 03/19/2018  [place], [place]

## 2018-03-19 ENCOUNTER — Ambulatory Visit (INDEPENDENT_AMBULATORY_CARE_PROVIDER_SITE_OTHER): Payer: Medicare Other | Admitting: Cardiovascular Disease

## 2018-03-19 ENCOUNTER — Encounter: Payer: Self-pay | Admitting: Cardiovascular Disease

## 2018-03-19 VITALS — BP 118/68 | HR 78 | Ht 66.0 in | Wt 214.0 lb

## 2018-03-19 DIAGNOSIS — I739 Peripheral vascular disease, unspecified: Secondary | ICD-10-CM

## 2018-03-19 DIAGNOSIS — I700 Atherosclerosis of aorta: Secondary | ICD-10-CM | POA: Diagnosis not present

## 2018-03-19 DIAGNOSIS — N183 Chronic kidney disease, stage 3 unspecified: Secondary | ICD-10-CM

## 2018-03-19 DIAGNOSIS — E118 Type 2 diabetes mellitus with unspecified complications: Secondary | ICD-10-CM

## 2018-03-19 DIAGNOSIS — I10 Essential (primary) hypertension: Secondary | ICD-10-CM

## 2018-03-19 DIAGNOSIS — E782 Mixed hyperlipidemia: Secondary | ICD-10-CM

## 2018-03-19 DIAGNOSIS — E1165 Type 2 diabetes mellitus with hyperglycemia: Secondary | ICD-10-CM | POA: Diagnosis not present

## 2018-03-19 DIAGNOSIS — I25118 Atherosclerotic heart disease of native coronary artery with other forms of angina pectoris: Secondary | ICD-10-CM

## 2018-03-19 DIAGNOSIS — R079 Chest pain, unspecified: Secondary | ICD-10-CM | POA: Diagnosis not present

## 2018-03-19 DIAGNOSIS — IMO0002 Reserved for concepts with insufficient information to code with codable children: Secondary | ICD-10-CM

## 2018-03-19 NOTE — Patient Instructions (Addendum)
Use both inhalers more  Medication Instructions:  No changes  If you need a refill on your cardiac medications before your next appointment, please call your pharmacy.    Lab work: No new labs needed   If you have labs (blood work) drawn today and your tests are completely normal, you will receive your results only by: [name] MyChart Message (if you have MyChart) OR . A paper copy in the mail If you have any lab test that is abnormal or we need to change your treatment, we will call you to review the results.   Testing/Procedures: We will schedule a lexiscan myoview No isosorbide or metoprolol  The night before or morning of the test No food or drink the morning of the test  [place]  Your caregiver has ordered a Stress Test with nuclear imaging. The purpose of this test is to evaluate the blood supply to your heart muscle. This procedure is referred to as a "Non-Invasive Stress Test." This is because other than having an IV started in your vein, nothing is inserted or "invades" your body. Cardiac stress tests are done to find areas of poor blood flow to the heart by determining the extent of coronary artery disease (CAD). Some patients exercise on a treadmill, which naturally increases the blood flow to your heart, while others who are  unable to walk on a treadmill due to physical limitations have a pharmacologic/chemical stress agent called Lexiscan . This medicine will mimic walking on a treadmill by temporarily increasing your coronary blood flow.   Please note: these test may take anywhere between 2-4 hours to complete  PLEASE REPORT TO [facility] AT THE FIRST DESK WILL DIRECT YOU WHERE TO GO  Date of Procedure:_____________________________________  Arrival Time for Procedure:______________________________  Instructions regarding medication:   _XX___ : Hold diabetes medication morning of procedure  _XX__:  Hold Metoprolol and Isosorbide  mononitrate the night before procedure and morning of procedure  PLEASE NOTIFY THE OFFICE AT LEAST 24 HOURS IN ADVANCE IF YOU ARE UNABLE TO KEEP YOUR APPOINTMENT.  [phone] AND  PLEASE NOTIFY NUCLEAR MEDICINE AT [facility] AT LEAST 24 HOURS IN ADVANCE IF YOU ARE UNABLE TO KEEP YOUR APPOINTMENT. [phone]  How to prepare for your Myoview test:  1. Do not eat or drink after midnight 2. No caffeine for 24 hours prior to test 3. No smoking 24 hours prior to test. 4. Your medication may be taken with water.  If your doctor stopped a medication because of this test, do not take that medication. 5. Ladies, please do not wear dresses.  Skirts or pants are appropriate. Please wear a short sleeve shirt. 6. No perfume, cologne or lotion. 7. Wear comfortable walking shoes. No heels!   Follow-Up: At [facility], you and your health needs are our priority.  As part of our continuing mission to provide you with exceptional heart care, we have created designated Provider Care Teams.  These Care Teams include your primary Cardiologist (physician) and Advanced Practice Providers (APPs -  Physician Assistants and Nurse Practitioners) who all work together to provide you with the care you need, when you need it.  . You will need a follow up appointment depending on the results of the stress test   . Providers on your designated Care Team:   . Murray Hodgkins, NP . Christell Faith, PA-C . Marrianne Mood, PA-C  Any Other Special Instructions Will Be Listed Below (If Applicable).  For educational  health videos Log in to : [email or web address] Or : [email or web address], password : [email or web address]

## 2018-03-20 NOTE — Addendum Note (Signed)
Addended by: Alba Destine on: 03/20/2018 11:47 AM   Modules accepted: Orders

## 2018-03-28 ENCOUNTER — Ambulatory Visit
Admission: RE | Admit: 2018-03-28 | Discharge: 2018-03-28 | Disposition: A | Discharge status: Home / Self Care | Payer: Medicare Other | Source: Ambulatory Visit | Attending: Cardiovascular Disease | Admitting: Cardiovascular Disease

## 2018-03-28 DIAGNOSIS — I25118 Atherosclerotic heart disease of native coronary artery with other forms of angina pectoris: Secondary | ICD-10-CM

## 2018-03-28 LAB — NM MYOCAR MULTI W/SPECT W/WALL MOTION / EF
Estimated workload: 1 METS
Exercise duration (min): 0 min
Exercise duration (sec): 0 s
LV dias vol: 58 mL (ref 46–106)
LV sys vol: 22 mL
MPHR: 146 {beats}/min
Peak HR: 100 {beats}/min
Percent HR: 68 %
Rest HR: 78 {beats}/min
SDS: 0
SRS: 3
SSS: 0
TID: 1.06

## 2018-03-28 MED ORDER — TECHNETIUM TC 99M TETROFOSMIN IV KIT
30.0000 | PACK | Freq: Once | INTRAVENOUS | Status: AC | PRN
  Administered 2018-03-28: 31.19 via INTRAVENOUS

## 2018-03-28 MED ORDER — TECHNETIUM TC 99M TETROFOSMIN IV KIT
10.7900 | PACK | Freq: Once | INTRAVENOUS | Status: AC | PRN
  Administered 2018-03-28: 10.79 via INTRAVENOUS

## 2018-03-28 MED ORDER — REGADENOSON 0.4 MG/5ML IV SOLN
0.4000 mg | Freq: Once | INTRAVENOUS | Status: AC
  Administered 2018-03-28: 0.4 mg via INTRAVENOUS

## 2018-04-10 DIAGNOSIS — I129 Hypertensive chronic kidney disease with stage 1 through stage 4 chronic kidney disease, or unspecified chronic kidney disease: Secondary | ICD-10-CM | POA: Diagnosis not present

## 2018-04-10 DIAGNOSIS — E785 Hyperlipidemia, unspecified: Secondary | ICD-10-CM | POA: Diagnosis not present

## 2018-04-10 DIAGNOSIS — N390 Urinary tract infection, site not specified: Secondary | ICD-10-CM | POA: Diagnosis not present

## 2018-04-10 DIAGNOSIS — E1122 Type 2 diabetes mellitus with diabetic chronic kidney disease: Secondary | ICD-10-CM | POA: Diagnosis not present

## 2018-04-10 DIAGNOSIS — R809 Proteinuria, unspecified: Secondary | ICD-10-CM | POA: Diagnosis not present

## 2018-04-10 DIAGNOSIS — N184 Chronic kidney disease, stage 4 (severe): Secondary | ICD-10-CM | POA: Diagnosis not present

## 2018-04-15 DIAGNOSIS — I129 Hypertensive chronic kidney disease with stage 1 through stage 4 chronic kidney disease, or unspecified chronic kidney disease: Secondary | ICD-10-CM | POA: Diagnosis not present

## 2018-04-15 DIAGNOSIS — E1122 Type 2 diabetes mellitus with diabetic chronic kidney disease: Secondary | ICD-10-CM | POA: Diagnosis not present

## 2018-04-15 DIAGNOSIS — N183 Chronic kidney disease, stage 3 (moderate): Secondary | ICD-10-CM | POA: Diagnosis not present

## 2018-04-15 DIAGNOSIS — R809 Proteinuria, unspecified: Secondary | ICD-10-CM | POA: Diagnosis not present

## 2018-05-14 ENCOUNTER — Encounter: Payer: Self-pay | Admitting: Nurse Practitioner

## 2018-05-14 ENCOUNTER — Ambulatory Visit (INDEPENDENT_AMBULATORY_CARE_PROVIDER_SITE_OTHER): Payer: Medicare Other | Admitting: Nurse Practitioner

## 2018-05-14 VITALS — BP 114/77 | HR 70 | Temp 97.6°F | Ht 66.0 in | Wt 213.0 lb

## 2018-05-14 DIAGNOSIS — J0100 Acute maxillary sinusitis, unspecified: Secondary | ICD-10-CM | POA: Diagnosis not present

## 2018-05-14 DIAGNOSIS — R300 Dysuria: Secondary | ICD-10-CM | POA: Diagnosis not present

## 2018-05-14 DIAGNOSIS — I25118 Atherosclerotic heart disease of native coronary artery with other forms of angina pectoris: Secondary | ICD-10-CM

## 2018-05-14 MED ORDER — MONTELUKAST SODIUM 10 MG PO TABS: 10.0000 mg | ORAL_TABLET | Freq: Every day | ORAL | 3 refills | Status: DC

## 2018-05-14 MED ORDER — DOXYCYCLINE HYCLATE 100 MG PO TABS: 100.0000 mg | ORAL_TABLET | Freq: Two times a day (BID) | ORAL | 0 refills | Status: DC

## 2018-05-14 MED ORDER — AMOXICILLIN-POT CLAVULANATE 875-125 MG PO TABS: 1.0000 | ORAL_TABLET | Freq: Two times a day (BID) | ORAL | 0 refills | Status: DC

## 2018-05-14 NOTE — Assessment & Plan Note (Signed)
Acute, in presence of ongoing chronic rhinitis.  Script for Singulair sent and Augmentin.  Recommend OTC Coricidin for symptoms management d/t HTN and educated on medication.  Saline nasal spray daily and sinus rinses as needed.  Place humidifier in room.  Return for worsening or continued issues.

## 2018-05-14 NOTE — Assessment & Plan Note (Addendum)
Urine returning 2+ blood, 3+ LEU, many bacteria.  + symptoms present.  Will send for culture.  Augmentin script sent which will cover sinus and urine.  Adjust plan of care based on culture.  Return for worsening or continued symptoms.

## 2018-05-14 NOTE — Progress Notes (Signed)
BP 114/77 (BP Location: Right Arm, Patient Position: Sitting, Cuff Size: Large)   Pulse 70   Temp 97.6 F (36.4 C)   Ht 5\' 6"  (1.676 m)   Wt 213 lb (96.6 kg)   SpO2 95%   BMI 34.38 kg/m    Subjective:    Patient ID: Tammy [name], female    DOB: 1945/05/01, 73 y.o.   MRN: [id number]  HPI: Tammy [name] is a 73 y.o. female  Chief Complaint  Patient presents with  . nose bleeds  . Dental Pain  . watery eyes    right eye pain  . Urinary Tract Infection   UPPER RESPIRATORY TRACT INFECTION Reports right eye pain, watery and nose bleeds + tooth pain.  Started one week ago.  Reports at baseline she has constant sinus issues or nasal drainage for 2-3 years, fluctuates.  Reports her husband and her have similar symptoms and they have tried "all" the OTC antihistamine medications, including Benadryl.  Recommended against using Benadryl and discussed BEERS criteria. Worst symptom: nose bleed and tooth pain Fever: no Cough: no Shortness of breath: no Wheezing: no Chest pain: no Chest tightness: no Chest congestion: no Nasal congestion: no Runny nose: yes Post nasal drip: yes Sneezing: yes Sore throat: no Swollen glands: no Sinus pressure: yes Headache: no Face pain: yes Toothache: yes Ear pain: none Ear pressure: yes "right Eyes red/itching:yes Eye drainage/crusting: clear drainage  Vomiting: no Rash: no Fatigue: yes Sick contacts: no Strep contacts: no  Context: fluctuating Recurrent sinusitis: no Relief with OTC cold/cough medications: no  Treatments attempted: Tylenol severe sinus and Benadryl   URINARY SYMPTOMS Reports some recent abdominal discomfort in suprapubic area and some nausea. Dysuria: yes Urinary frequency: no Urgency: no Small volume voids: no Symptom severity: no Urinary incontinence: no Foul odor: no Hematuria: no Abdominal pain: yes Back pain: no Suprapubic pain/pressure: yes Flank pain: no Fever:  no Vomiting: no Relief with cranberry  juice: not taking Relief with pyridium: not taking Status: better/worse/stable Previous urinary tract infection: no Recurrent urinary tract infection: no Sexual activity: no History of sexually transmitted disease: no Treatments attempted: none   Relevant past medical, surgical, family and social history reviewed and updated as indicated. Interim medical history since our last visit reviewed. Allergies and medications reviewed and updated.  Review of Systems  Constitutional: Positive for fatigue. Negative for activity change, appetite change, diaphoresis and fever.  HENT: Positive for postnasal drip, rhinorrhea, sinus pressure, sinus pain and sneezing. Negative for congestion, ear discharge, ear pain, sore throat and voice change.   Respiratory: Negative for cough, chest tightness, shortness of breath and wheezing.   Cardiovascular: Negative for chest pain, palpitations and leg swelling.  Gastrointestinal: Negative for abdominal distention, abdominal pain, constipation, diarrhea, nausea and vomiting.  Endocrine: Negative for cold intolerance, heat intolerance, polydipsia, polyphagia and polyuria.  Genitourinary: Positive for dysuria. Negative for decreased urine volume, difficulty urinating, flank pain, frequency, hematuria and urgency.  Neurological: Positive for headaches. Negative for dizziness, syncope, weakness, light-headedness and numbness.  Psychiatric/Behavioral: Negative.    Per HPI unless specifically indicated above     Objective:    BP 114/77 (BP Location: Right Arm, Patient Position: Sitting, Cuff Size: Large)   Pulse 70   Temp 97.6 F (36.4 C)   Ht 5\' 6"  (1.676 m)   Wt 213 lb (96.6 kg)   SpO2 95%   BMI 34.38 kg/m   Wt Readings from Last 3 Encounters:  05/14/18 213 lb (96.6 kg)  03/19/18 214 lb (97.1 kg)  03/11/18 210 lb (95.3 kg)    Physical Exam Vitals signs and nursing note reviewed.  Constitutional:      General: She is awake.     Appearance: She is  well-developed. She is obese.  HENT:     Head: Normocephalic. No raccoon eyes.     Right Ear: Hearing, ear canal and external ear normal. No drainage. A middle ear effusion is present.     Left Ear: Hearing, ear canal and external ear normal. No drainage. A middle ear effusion is present.     Nose: Mucosal edema and rhinorrhea present. Rhinorrhea is purulent.     Right Sinus: Maxillary sinus tenderness present. No frontal sinus tenderness.     Left Sinus: Maxillary sinus tenderness present. No frontal sinus tenderness.     Comments: Maxillary sinus pain R>L    Mouth/Throat:     Mouth: Mucous membranes are moist.     Pharynx: Posterior oropharyngeal erythema (mild with cobblestoning) present. No pharyngeal swelling or oropharyngeal exudate.     Tonsils: Swelling: 0 on the right. 0 on the left.  Eyes:     General:        Right eye: No discharge.        Left eye: No discharge.     Conjunctiva/sclera: Conjunctivae normal.     Pupils: Pupils are equal, round, and reactive to light.  Neck:     Musculoskeletal: Normal range of motion and neck supple.     Thyroid: No thyromegaly.     Vascular: No carotid bruit or JVD.  Cardiovascular:     Rate and Rhythm: Normal rate and regular rhythm.     Heart sounds: Normal heart sounds. No murmur. No gallop.   Pulmonary:     Effort: Pulmonary effort is normal.     Breath sounds: Normal breath sounds.     Comments: Clear throughout Abdominal:     General: Bowel sounds are normal.     Palpations: Abdomen is soft.     Tenderness: There is abdominal tenderness in the suprapubic area. There is no right CVA tenderness or left CVA tenderness.  Musculoskeletal:     Right lower leg: No edema.     Left lower leg: No edema.  Lymphadenopathy:     Head:     Right side of head: No submental, submandibular, tonsillar or preauricular adenopathy.     Left side of head: No submental, submandibular, tonsillar or preauricular adenopathy.     Cervical: No cervical  adenopathy.  Skin:    General: Skin is warm and dry.  Neurological:     Mental Status: She is alert and oriented to person, place, and time.     Deep Tendon Reflexes: Reflexes are normal and symmetric.  Psychiatric:        Attention and Perception: Attention normal.        Mood and Affect: Mood normal.        Speech: Speech normal.        Behavior: Behavior normal. Behavior is cooperative.        Thought Content: Thought content normal.        Judgment: Judgment normal.     Results for orders placed or performed during the hospital encounter of 03/28/18  NM Myocar Multi W/Spect W/Wall Motion / EF  Result Value Ref Range   Rest HR 78 bpm   Rest BP 115/64 mmHg   Exercise duration (sec) 0 sec   Percent HR  68 %   Exercise duration (min) 0 min   Estimated workload 1.0 METS   Peak HR 100 bpm   Peak BP 114/56 mmHg   MPHR 146 bpm   SSS 0    SRS 3    SDS 0    TID 1.06    LV sys vol 22 mL   LV dias vol 58 46 - 106 mL      Assessment & Plan:   Problem List Items Addressed This Visit      Respiratory   Maxillary sinusitis, acute    Acute, in presence of ongoing chronic rhinitis.  Script for Singulair sent and Augmentin.  Recommend OTC Coricidin for symptoms management d/t HTN and educated on medication.  Saline nasal spray daily and sinus rinses as needed.  Place humidifier in room.  Return for worsening or continued issues.      Relevant Medications   amoxicillin-clavulanate (AUGMENTIN) 875-125 MG tablet     Other   Dysuria - Primary    Urine returning 2+ blood, 3+ LEU, many bacteria.  + symptoms present.  Will send for culture.  Augmentin script sent which will cover sinus and urine.  Adjust plan of care based on culture.  Return for worsening or continued symptoms.        Relevant Orders   UA/M w/rflx Culture, Routine       Follow up plan: Return if symptoms worsen or fail to improve.

## 2018-05-14 NOTE — Patient Instructions (Signed)

## 2018-05-17 LAB — UA/M W/RFLX CULTURE, ROUTINE
Bilirubin, UA: NEGATIVE
Glucose, UA: NEGATIVE
Ketones, UA: NEGATIVE
Nitrite, UA: NEGATIVE
Specific Gravity, UA: 1.02 (ref 1.005–1.030)
Urobilinogen, Ur: 0.2 mg/dL (ref 0.2–1.0)
pH, UA: 6 (ref 5.0–7.5)

## 2018-05-17 LAB — URINE CULTURE, REFLEX

## 2018-05-17 LAB — MICROSCOPIC EXAMINATION: WBC, UA: >30 /hpf — AB (ref 0–5)

## 2018-05-21 ENCOUNTER — Ambulatory Visit (INDEPENDENT_AMBULATORY_CARE_PROVIDER_SITE_OTHER): Payer: Medicare Other | Admitting: Family Medicine

## 2018-05-21 ENCOUNTER — Encounter: Payer: Self-pay | Admitting: Family Medicine

## 2018-05-21 VITALS — BP 114/78 | HR 84 | Temp 97.6°F | Ht 66.0 in | Wt 206.0 lb

## 2018-05-21 DIAGNOSIS — I129 Hypertensive chronic kidney disease with stage 1 through stage 4 chronic kidney disease, or unspecified chronic kidney disease: Secondary | ICD-10-CM | POA: Diagnosis not present

## 2018-05-21 DIAGNOSIS — I739 Peripheral vascular disease, unspecified: Secondary | ICD-10-CM

## 2018-05-21 DIAGNOSIS — E782 Mixed hyperlipidemia: Secondary | ICD-10-CM | POA: Diagnosis not present

## 2018-05-21 DIAGNOSIS — E039 Hypothyroidism, unspecified: Secondary | ICD-10-CM

## 2018-05-21 DIAGNOSIS — Z6833 Body mass index (BMI) 33.0-33.9, adult: Secondary | ICD-10-CM

## 2018-05-21 DIAGNOSIS — I209 Angina pectoris, unspecified: Secondary | ICD-10-CM | POA: Diagnosis not present

## 2018-05-21 DIAGNOSIS — I700 Atherosclerosis of aorta: Secondary | ICD-10-CM

## 2018-05-21 DIAGNOSIS — E118 Type 2 diabetes mellitus with unspecified complications: Secondary | ICD-10-CM

## 2018-05-21 DIAGNOSIS — H5711 Ocular pain, right eye: Secondary | ICD-10-CM

## 2018-05-21 DIAGNOSIS — E1165 Type 2 diabetes mellitus with hyperglycemia: Secondary | ICD-10-CM

## 2018-05-21 DIAGNOSIS — I77811 Abdominal aortic ectasia: Secondary | ICD-10-CM | POA: Diagnosis not present

## 2018-05-21 DIAGNOSIS — R079 Chest pain, unspecified: Secondary | ICD-10-CM | POA: Diagnosis not present

## 2018-05-21 DIAGNOSIS — R197 Diarrhea, unspecified: Secondary | ICD-10-CM | POA: Diagnosis not present

## 2018-05-21 DIAGNOSIS — N644 Mastodynia: Secondary | ICD-10-CM

## 2018-05-21 DIAGNOSIS — J449 Chronic obstructive pulmonary disease, unspecified: Secondary | ICD-10-CM

## 2018-05-21 DIAGNOSIS — I25111 Atherosclerotic heart disease of native coronary artery with angina pectoris with documented spasm: Secondary | ICD-10-CM

## 2018-05-21 DIAGNOSIS — E6609 Other obesity due to excess calories: Secondary | ICD-10-CM | POA: Diagnosis not present

## 2018-05-21 DIAGNOSIS — R8290 Unspecified abnormal findings in urine: Secondary | ICD-10-CM | POA: Diagnosis not present

## 2018-05-21 DIAGNOSIS — IMO0002 Reserved for concepts with insufficient information to code with codable children: Secondary | ICD-10-CM

## 2018-05-21 DIAGNOSIS — N183 Chronic kidney disease, stage 3 unspecified: Secondary | ICD-10-CM

## 2018-05-21 MED ORDER — DULOXETINE HCL 60 MG PO CPEP: 60.0000 mg | ORAL_CAPSULE | Freq: Every day | ORAL | 1 refills | Status: DC

## 2018-05-21 MED ORDER — PANTOPRAZOLE SODIUM 40 MG PO TBEC: 40.0000 mg | DELAYED_RELEASE_TABLET | Freq: Every day | ORAL | 1 refills | Status: DC | PRN

## 2018-05-21 MED ORDER — GLIPIZIDE 5 MG PO TABS: ORAL_TABLET | ORAL | 1 refills | Status: DC

## 2018-05-21 NOTE — Assessment & Plan Note (Signed)
Will keep BP, cholesterol and sugars under good control. Call with any concerns. Continue to follow with cardiology.

## 2018-05-21 NOTE — Patient Instructions (Signed)
Call to schedule your mammogram: [facility] at [facility]  Address: [address], [place], [place] [address]  Phone: [phone]

## 2018-05-21 NOTE — Progress Notes (Signed)
BP 114/78   Pulse 84   Temp 97.6 F (36.4 C) (Oral)   Ht 5\' 6"  (1.676 m)   Wt 206 lb (93.4 kg)   SpO2 96%   BMI 33.25 kg/m    Subjective:    Patient ID: Tammy [name], female    DOB: [birth year], 73 y.o.   MRN: [id number]  HPI: Tammy [name] is a 73 y.o. female  Chief Complaint  Patient presents with  . Follow-up  . Diabetes  . Hypertension  . Headache    Right eye pain, hurts to move eye. water. Has improved since last week  . Tinnitus    Right worse than left  . Bowel issues    BM every time she urinates. Dark stool  . Breast Pain    Left breast  . Knee Pain    Left worse than right. Behind knee. Feels like it is cracking.    DIABETES Hypoglycemic episodes:no Polydipsia/polyuria: no Visual disturbance: yes Chest pain: no Paresthesias: no Glucose Monitoring: yes  Accucheck frequency: Daily Taking Insulin?: no Blood Pressure Monitoring: not checking Retinal Examination: Not up to Date Foot Exam: Up to Date Diabetic Education: Completed Pneumovax: Up to Date Influenza: Up to Date Aspirin: no  HYPERTENSION / [place] Satisfied with current treatment? yes Duration of hypertension: chronic BP monitoring frequency: not checking BP medication side effects: no Past BP meds: losartan, metoprolol, imdur Duration of hyperlipidemia: chronic Cholesterol medication side effects: no Cholesterol supplements: none Past cholesterol medications: crestor Medication compliance: excellent compliance Aspirin: no Recent stressors: no Recurrent headaches: yes Visual changes: yes Palpitations: no Dyspnea: no Chest pain: no Lower extremity edema: no Dizzy/lightheaded: no  EYE PAIN- sees [facility], saw them about a month ago and they said everything was fine Duration: about a month Involved eye:  right Onset: sudden Severity: severe  Quality: constant, ache Foreign body sensation:no Visual impairment: yes Eye redness: no Discharge: no Crusting or matting of  eyelids: no Swelling: yes Photophobia: yes Itching: no Tearing: yes Headache: yes Floaters: yes URI symptoms: no Contact lens use: no Close contacts with similar problems: no Eye trauma: no Status: better  ABDOMINAL ISSUES Duration: about a week Nature: "pain" Location: LLQ  Severity: moderate  Radiation: no Episode duration: until she has a BM Frequency: several times a day every time she has to pee Treatments attempted: none Constipation: no Diarrhea: yes Episodes of diarrhea/day: 5-6x a day, woke her up in the middle of the night Mucous in the stool: no Heartburn: no Bloating:no Flatulence: no Nausea: yes Vomiting: yes Episodes of vomit/day: 2x in the last week Melena or hematochezia: yes- very dark stools Rash: no Jaundice: no Fever: no Weight loss: yes- has lost 7 lbs since last visit  BREAST PAIN-  Duration : 3-4 days Location: right in her nipple Onset: sudden Severity: severe Quality: shooting pain Frequency: touching and not wearing a bra Redness: unknown Swelling: yes Trauma: no trauma Breastfeeding: no Associated with menstral cycle: N/A Nipple discharge: no Breast lump: no Status: better Treatments attempted: wearing a bra Previous mammogram: yes  Will have her return to discuss knee pain and tinnitus  Relevant past medical, surgical, family and social history reviewed and updated as indicated. Interim medical history since our last visit reviewed. Allergies and medications reviewed and updated.  Review of Systems  Constitutional: Positive for fatigue and unexpected weight change. Negative for activity change, appetite change, chills, diaphoresis and fever.  HENT: Negative.   Eyes: Positive for photophobia and pain.  Negative for discharge, redness, itching and visual disturbance.  Respiratory: Negative.   Cardiovascular: Negative.   Gastrointestinal: Positive for abdominal pain and diarrhea. Negative for abdominal distention, anal  bleeding, blood in stool, constipation, nausea, rectal pain and vomiting.  Genitourinary: Negative.   Musculoskeletal: Negative.   Skin: Negative.   Neurological: Negative.   Psychiatric/Behavioral: Negative.     Per HPI unless specifically indicated above     Objective:    BP 114/78   Pulse 84   Temp 97.6 F (36.4 C) (Oral)   Ht 5\' 6"  (1.676 m)   Wt 206 lb (93.4 kg)   SpO2 96%   BMI 33.25 kg/m   Wt Readings from Last 3 Encounters:  05/21/18 206 lb (93.4 kg)  05/14/18 213 lb (96.6 kg)  03/19/18 214 lb (97.1 kg)     Visual Acuity Screening   Right eye Left eye Both eyes  Without correction: 20/25 20/25 20/25   With correction:      Physical Exam Vitals signs and nursing note reviewed.  Constitutional:      General: She is not in acute distress.    Appearance: Normal appearance. She is obese. She is not ill-appearing, toxic-appearing or diaphoretic.  HENT:     Head: Normocephalic and atraumatic.     Right Ear: External ear normal.     Left Ear: External ear normal.     Nose: Nose normal.     Mouth/Throat:     Mouth: Mucous membranes are moist.     Pharynx: Oropharynx is clear.  Eyes:     General: No scleral icterus.       Right eye: No discharge.        Left eye: No discharge.     Extraocular Movements: Extraocular movements intact.     Right eye: Normal extraocular motion and no nystagmus.     Left eye: Normal extraocular motion and no nystagmus.     Conjunctiva/sclera: Conjunctivae normal.     Pupils: Pupils are equal, round, and reactive to light. Pupils are equal.     Right eye: Pupil is round and reactive.     Left eye: Pupil is round and reactive.  Neck:     Musculoskeletal: Normal range of motion and neck supple.  Cardiovascular:     Rate and Rhythm: Normal rate and regular rhythm.     Pulses: Normal pulses.     Heart sounds: Normal heart sounds. No murmur. No friction rub. No gallop.   Pulmonary:     Effort: Pulmonary effort is normal. No  respiratory distress.     Breath sounds: Normal breath sounds. No stridor. No wheezing, rhonchi or rales.  Chest:     Chest wall: No tenderness.  Abdominal:     General: Bowel sounds are normal. There is no distension.     Palpations: Abdomen is soft. There is no mass.     Tenderness: There is abdominal tenderness. There is no guarding.  Musculoskeletal: Normal range of motion.        General: No swelling or tenderness.  Skin:    General: Skin is warm and dry.     Capillary Refill: Capillary refill takes less than 2 seconds.     Coloration: Skin is not cyanotic, jaundiced or pale.     Findings: No bruising, erythema, lesion or rash.  Neurological:     General: No focal deficit present.     Mental Status: She is alert and oriented to person, place, and time. Mental status  is at baseline.  Psychiatric:        Mood and Affect: Mood normal.        Behavior: Behavior normal.        Thought Content: Thought content normal.        Judgment: Judgment normal.     Results for orders placed or performed in visit on 05/21/18  Microscopic Examination  Result Value Ref Range   WBC, UA >30 (A) 0 - 5 /hpf   RBC, UA 0-2 0 - 2 /hpf   Epithelial Cells (non renal) 0-10 0 - 10 /hpf   Bacteria, UA Many (A) None seen/Few  Urine Culture, Reflex  Result Value Ref Range   Urine Culture, Routine Final report    Organism ID, Bacteria No growth   Bayer DCA Hb A1c Waived  Result Value Ref Range   HB A1C (BAYER DCA - WAIVED) 7.7 (H) <7.0 %  Comprehensive metabolic panel  Result Value Ref Range   Glucose 138 (H) 65 - 99 mg/dL   BUN 28 (H) 8 - 27 mg/dL   Creatinine, Ser 1.59 (H) 0.57 - 1.00 mg/dL   GFR calc non Af Amer 32 (L) >59 mL/min/1.73   GFR calc Af Amer 37 (L) >59 mL/min/1.73   BUN/Creatinine Ratio 18 12 - 28   Sodium 138 134 - 144 mmol/L   Potassium 6.0 (H) 3.5 - 5.2 mmol/L   Chloride 100 96 - 106 mmol/L   CO2 22 20 - 29 mmol/L   Calcium 9.3 8.7 - 10.3 mg/dL   Total Protein 7.2 6.0 - 8.5  g/dL   Albumin 4.4 3.7 - 4.7 g/dL   Globulin, Total 2.8 1.5 - 4.5 g/dL   Albumin/Globulin Ratio 1.6 1.2 - 2.2   Bilirubin Total 0.5 0.0 - 1.2 mg/dL   Alkaline Phosphatase 75 39 - 117 IU/L   AST 17 0 - 40 IU/L   ALT 11 0 - 32 IU/L  CBC with Differential/Platelet  Result Value Ref Range   WBC 11.2 (H) 3.4 - 10.8 x10E3/uL   RBC 4.89 3.77 - 5.28 x10E6/uL   Hemoglobin 14.2 11.1 - 15.9 g/dL   Hematocrit 43.3 34.0 - 46.6 %   MCV 89 79 - 97 fL   MCH 29.0 26.6 - 33.0 pg   MCHC 32.8 31.5 - 35.7 g/dL   RDW 13.0 11.7 - 15.4 %   Platelets 285 150 - 450 x10E3/uL   Neutrophils 63 Not Estab. %   Lymphs 25 Not Estab. %   Monocytes 9 Not Estab. %   Eos 2 Not Estab. %   Basos 1 Not Estab. %   Neutrophils Absolute 7.0 1.4 - 7.0 x10E3/uL   Lymphocytes Absolute 2.9 0.7 - 3.1 x10E3/uL   Monocytes Absolute 1.0 (H) 0.1 - 0.9 x10E3/uL   EOS (ABSOLUTE) 0.2 0.0 - 0.4 x10E3/uL   Basophils Absolute 0.1 0.0 - 0.2 x10E3/uL   Immature Granulocytes 0 Not Estab. %   Immature Grans (Abs) 0.0 0.0 - 0.1 x10E3/uL  Lipid Panel w/o Chol/HDL Ratio  Result Value Ref Range   Cholesterol, Total 126 100 - 199 mg/dL   Triglycerides 195 (H) 0 - 149 mg/dL   HDL 40 >39 mg/dL   VLDL Cholesterol Cal 39 5 - 40 mg/dL   LDL Calculated 47 0 - 99 mg/dL  TSH  Result Value Ref Range   TSH 0.481 0.450 - 4.500 uIU/mL  UA/M w/rflx Culture, Routine  Result Value Ref Range   Specific Gravity, UA 1.025 1.005 -  1.030   pH, UA 5.5 5.0 - 7.5   Color, UA Yellow Yellow   Appearance Ur Cloudy (A) Clear   Leukocytes, UA 2+ (A) Negative   Protein, UA 2+ (A) Negative/Trace   Glucose, UA Negative Negative   Ketones, UA Negative Negative   RBC, UA 2+ (A) Negative   Bilirubin, UA Negative Negative   Urobilinogen, Ur 0.2 0.2 - 1.0 mg/dL   Nitrite, UA Negative Negative   Microscopic Examination See below:    Urinalysis Reflex Comment       Assessment & Plan:   Problem List Items Addressed This Visit      Cardiovascular and  Mediastinum   PAD (peripheral artery disease) (HCC)    Will keep BP, cholesterol and sugars under good control. Call with any concerns. Continue to follow with cardiology.      Coronary artery disease   Angina pectoris (HCC)    Will keep BP, cholesterol and sugars under good control. Call with any concerns. Continue to follow with cardiology.      Abdominal aortic atherosclerosis ([place])    Will keep BP, cholesterol and sugars under good control. Call with any concerns. Continue to follow with cardiology.      Abdominal aortic ectasia (HCC)    Will keep BP, cholesterol and sugars under good control. Call with any concerns. Continue to follow with cardiology.        Respiratory   COPD (chronic obstructive pulmonary disease) (HCC)    Lungs clear. Continue current regimen. Continue to monitor. Call with any concerns.         Endocrine   Diabetes mellitus type 2 with complications, uncontrolled ([place]) - Primary    Going in the wrong direction with A1c of 7.7 up from 7.4- will leave everything stable and recheck in 3 months. Call with any concerns.       Relevant Medications   glipiZIDE (GLUCOTROL) 5 MG tablet   Other Relevant Orders   Bayer DCA Hb A1c Waived (Completed)   Comprehensive metabolic panel (Completed)   CBC with Differential/Platelet (Completed)   TSH (Completed)   UA/M w/rflx Culture, Routine (Completed)   Hypothyroid    Rechecking labs today. Treat as needed. Call with any concerns.         Genitourinary   Benign hypertensive renal disease    Under good control on current regimen. Continue current regimen. Continue to monitor. Call with any concerns. Refills given. Labs drawn today.       Chronic renal insufficiency, stage III (moderate) (HCC)    Rechecking labs today. Treat as needed. Call with any concerns.         Other   Hyperlipidemia    Under good control on current regimen. Continue current regimen. Continue to monitor. Call with any concerns.  Refills given. Labs drawn today.      Relevant Orders   Comprehensive metabolic panel (Completed)   CBC with Differential/Platelet (Completed)   Lipid Panel w/o Chol/HDL Ratio (Completed)   TSH (Completed)   Obesity    Patient has lost 7lbs in 1 week with constant diarrhea. Concern for infectious cause. Will check stool studies. Encouraged weight loss- but at healthy drop of 1-2lbs a week.       Relevant Medications   glipiZIDE (GLUCOTROL) 5 MG tablet    Other Visit Diagnoses    Diarrhea of presumed infectious origin       Will obtain stool studies. Await results. Call with any concerns.    Relevant  Orders   Stool Culture   Fecal leukocytes   Stool C-Diff Toxin Assay (Completed)   Fecal occult blood, imunochemical([facility]) (Completed)   Ova and parasite examination   Left-sided chest pain       EKG normal. Will obtain mammogram order placed.    Relevant Orders   EKG 12-Lead (Completed)   Breast pain, left       Will obtain mammogram. Order placed today.   Relevant Orders   MM Digital Diagnostic Unilat L   US BREAST LTD UNI LEFT INC AXILLA   Pain of right eye       Vision normal. Eye exam normal. Will get her into opthamology- has appointment tomorrow morning.        Follow up plan: Return in about 1 week (around 05/28/2018) for follow up.   >40 minutes spent with patient in counseling and coordination of care today

## 2018-05-21 NOTE — Assessment & Plan Note (Signed)
Rechecking labs today. Treat as needed. Call with any concerns.

## 2018-05-21 NOTE — Assessment & Plan Note (Signed)
Lungs clear. Continue current regimen. Continue to monitor. Call with any concerns.

## 2018-05-21 NOTE — Assessment & Plan Note (Signed)
Under good control on current regimen. Continue current regimen. Continue to monitor. Call with any concerns. Refills given. Labs drawn today.   

## 2018-05-21 NOTE — Assessment & Plan Note (Signed)
Going in the wrong direction with A1c of 7.7 up from 7.4- will leave everything stable and recheck in 3 months. Call with any concerns.

## 2018-05-21 NOTE — Assessment & Plan Note (Signed)
Patient has lost 7lbs in 1 week with constant diarrhea. Concern for infectious cause. Will check stool studies. Encouraged weight loss- but at healthy drop of 1-2lbs a week.

## 2018-05-22 ENCOUNTER — Other Ambulatory Visit: Payer: Self-pay

## 2018-05-22 ENCOUNTER — Other Ambulatory Visit: Payer: Medicare Other

## 2018-05-22 DIAGNOSIS — R197 Diarrhea, unspecified: Secondary | ICD-10-CM

## 2018-05-22 LAB — COMPREHENSIVE METABOLIC PANEL
ALT: 11 IU/L (ref 0–32)
AST: 17 IU/L (ref 0–40)
Albumin/Globulin Ratio: 1.6 (ref 1.2–2.2)
Albumin: 4.4 g/dL (ref 3.7–4.7)
Alkaline Phosphatase: 75 IU/L (ref 39–117)
BUN/Creatinine Ratio: 18 (ref 12–28)
BUN: 28 mg/dL — ABNORMAL HIGH (ref 8–27)
Bilirubin Total: 0.5 mg/dL (ref 0.0–1.2)
CO2: 22 mmol/L (ref 20–29)
Calcium: 9.3 mg/dL (ref 8.7–10.3)
Chloride: 100 mmol/L (ref 96–106)
Creatinine, Ser: 1.59 mg/dL — ABNORMAL HIGH (ref 0.57–1.00)
GFR calc Af Amer: 37 mL/min/{1.73_m2} — ABNORMAL LOW (ref >59)
GFR calc non Af Amer: 32 mL/min/{1.73_m2} — ABNORMAL LOW (ref >59)
Globulin, Total: 2.8 g/dL (ref 1.5–4.5)
Glucose: 138 mg/dL — ABNORMAL HIGH (ref 65–99)
Potassium: 6 mmol/L — ABNORMAL HIGH (ref 3.5–5.2)
Sodium: 138 mmol/L (ref 134–144)
Total Protein: 7.2 g/dL (ref 6.0–8.5)

## 2018-05-22 LAB — CBC WITH DIFFERENTIAL/PLATELET
Basophils Absolute: 0.1 10*3/uL (ref 0.0–0.2)
Basos: 1 %
EOS (ABSOLUTE): 0.2 10*3/uL (ref 0.0–0.4)
Eos: 2 %
Hematocrit: 43.3 % (ref 34.0–46.6)
Hemoglobin: 14.2 g/dL (ref 11.1–15.9)
Immature Grans (Abs): 0 10*3/uL (ref 0.0–0.1)
Immature Granulocytes: 0 %
Lymphocytes Absolute: 2.9 10*3/uL (ref 0.7–3.1)
Lymphs: 25 %
MCH: 29 pg (ref 26.6–33.0)
MCHC: 32.8 g/dL (ref 31.5–35.7)
MCV: 89 fL (ref 79–97)
Monocytes Absolute: 1 10*3/uL — ABNORMAL HIGH (ref 0.1–0.9)
Monocytes: 9 %
Neutrophils Absolute: 7 10*3/uL (ref 1.4–7.0)
Neutrophils: 63 %
Platelets: 285 10*3/uL (ref 150–450)
RBC: 4.89 x10E6/uL (ref 3.77–5.28)
RDW: 13 % (ref 11.7–15.4)
WBC: 11.2 10*3/uL — ABNORMAL HIGH (ref 3.4–10.8)

## 2018-05-22 LAB — TSH: TSH: 0.481 u[IU]/mL (ref 0.450–4.500)

## 2018-05-22 LAB — LIPID PANEL W/O CHOL/HDL RATIO
Cholesterol, Total: 126 mg/dL (ref 100–199)
HDL: 40 mg/dL (ref >39)
LDL Calculated: 47 mg/dL (ref 0–99)
Triglycerides: 195 mg/dL — ABNORMAL HIGH (ref 0–149)
VLDL Cholesterol Cal: 39 mg/dL (ref 5–40)

## 2018-05-23 LAB — UA/M W/RFLX CULTURE, ROUTINE
Bilirubin, UA: NEGATIVE
Glucose, UA: NEGATIVE
Ketones, UA: NEGATIVE
Nitrite, UA: NEGATIVE
Specific Gravity, UA: 1.025 (ref 1.005–1.030)
Urobilinogen, Ur: 0.2 mg/dL (ref 0.2–1.0)
pH, UA: 5.5 (ref 5.0–7.5)

## 2018-05-23 LAB — BAYER DCA HB A1C WAIVED: HB A1C (BAYER DCA - WAIVED): 7.7 % — ABNORMAL HIGH (ref <7.0)

## 2018-05-23 LAB — MICROSCOPIC EXAMINATION: WBC, UA: >30 /hpf — AB (ref 0–5)

## 2018-05-23 LAB — URINE CULTURE, REFLEX: Organism ID, Bacteria: NO GROWTH

## 2018-05-24 ENCOUNTER — Telehealth: Payer: Self-pay | Admitting: Family Medicine

## 2018-05-24 DIAGNOSIS — R195 Other fecal abnormalities: Secondary | ICD-10-CM

## 2018-05-24 DIAGNOSIS — R197 Diarrhea, unspecified: Secondary | ICD-10-CM

## 2018-05-24 LAB — FECAL OCCULT BLOOD, IMMUNOCHEMICAL: Fecal Occult Bld: POSITIVE — AB

## 2018-05-24 LAB — CLOSTRIDIUM DIFFICILE EIA: C difficile Toxins A+B, EIA: NEGATIVE

## 2018-05-24 NOTE — Telephone Encounter (Signed)
Please let her know that her labs came back stable, no concerns on those. Her stool studies so far have been normal except she was positive for blood in her stool, so I'm going to get her in to see the GI doctor. Thanks!

## 2018-05-24 NOTE — Telephone Encounter (Signed)
Patient notified

## 2018-05-27 LAB — OVA AND PARASITE EXAMINATION

## 2018-05-28 ENCOUNTER — Encounter: Payer: Self-pay | Admitting: Family Medicine

## 2018-05-28 ENCOUNTER — Ambulatory Visit (INDEPENDENT_AMBULATORY_CARE_PROVIDER_SITE_OTHER): Payer: Medicare Other | Admitting: Family Medicine

## 2018-05-28 ENCOUNTER — Other Ambulatory Visit: Payer: Self-pay

## 2018-05-28 VITALS — BP 135/79 | HR 69 | Temp 97.6°F | Ht 66.0 in

## 2018-05-28 DIAGNOSIS — M7122 Synovial cyst of popliteal space [Baker], left knee: Secondary | ICD-10-CM | POA: Diagnosis not present

## 2018-05-28 DIAGNOSIS — H9311 Tinnitus, right ear: Secondary | ICD-10-CM | POA: Diagnosis not present

## 2018-05-28 DIAGNOSIS — R195 Other fecal abnormalities: Secondary | ICD-10-CM

## 2018-05-28 DIAGNOSIS — I209 Angina pectoris, unspecified: Secondary | ICD-10-CM | POA: Diagnosis not present

## 2018-05-28 DIAGNOSIS — R42 Dizziness and giddiness: Secondary | ICD-10-CM

## 2018-05-28 MED ORDER — ONDANSETRON HCL 8 MG PO TABS: 8.0000 mg | ORAL_TABLET | Freq: Three times a day (TID) | ORAL | 1 refills | Status: DC | PRN

## 2018-05-28 MED ORDER — MECLIZINE HCL 25 MG PO TABS: 25.0000 mg | ORAL_TABLET | Freq: Three times a day (TID) | ORAL | 1 refills | Status: DC | PRN

## 2018-05-28 NOTE — Patient Instructions (Signed)
Baker Cyst A Baker cyst, also called a popliteal cyst, is a sac-like growth that forms at the back of the knee. The cyst forms when the fluid-filled sac (bursa) that cushions the knee joint becomes enlarged. The bursa that becomes a Baker cyst is located at the back of the knee joint. What are the causes? In most cases, a Baker cyst results from another knee problem that causes swelling inside the knee. This makes the fluid inside the knee joint (synovial fluid) flow into the bursa behind the knee, causing the bursa to enlarge. What increases the risk? You may be more likely to develop a Baker cyst if you already have a knee problem, such as:  A tear in cartilage that cushions the knee joint (meniscal tear).  A tear in the tissues that connect the bones of the knee joint (ligament tear).  Knee swelling from osteoarthritis, rheumatoid arthritis, or gout. What are the signs or symptoms? A Baker cyst does not always cause symptoms. A lump behind the knee may be the only sign of the condition. The lump may be painful, especially when the knee is straightened. If the lump is painful, the pain may come and go. The knee may also be stiff. Symptoms may quickly get more severe if the cyst breaks open (ruptures). If your cyst ruptures, signs and symptoms may affect the knee and the back of the lower leg (calf) and may include:  Sudden or worsening pain.  Swelling.  Bruising. How is this diagnosed? This condition may be diagnosed based on your symptoms and medical history. Your health care provider will also do a physical exam. This may include:  Feeling the cyst to check whether it is tender.  Checking your knee for signs of another knee condition that causes swelling. You may have imaging tests, such as:  X-rays.  MRI.  Ultrasound. How is this treated? A Baker cyst that is not painful may go away without treatment. If the cyst gets large or painful, it will likely get better if the  underlying knee problem is treated. Treatment for a Baker cyst may include:  Resting.  Keeping weight off of the knee. This means not leaning on the knee to support your body weight.  NSAIDs to reduce pain and swelling.  A procedure to drain the fluid from the cyst with a needle (aspiration). You may also get an injection of a medicine that reduces swelling (steroid).  Surgery. This may be needed if other treatments do not work. This usually involves correcting knee damage and removing the cyst. Follow these instructions at home:   Take over-the-counter and prescription medicines only as told by your health care provider.  Rest and return to your normal activities as told by your health care provider. Avoid activities that make pain or swelling worse. Ask your health care provider what activities are safe for you.  Keep all follow-up visits as told by your health care provider. This is important. Contact a health care provider if:  You have knee pain, stiffness, or swelling that does not get better. Get help right away if:  You have sudden or worsening pain and swelling in your calf area. This information is not intended to replace advice given to you by your health care provider. Make sure you discuss any questions you have with your health care provider. Document Released: 02/27/2005 Document Revised: 11/18/2015 Document Reviewed: 11/18/2015 Elsevier Interactive Patient Education  2019 [employer]  Meniere Disease  Meniere disease is an inner  ear disorder. It causes attacks of a spinning sensation (vertigo), dizziness, and ringing in the ear (tinnitus). It also causes hearing loss and a feeling of fullness or pressure in the ear. This is a lifelong condition, and it may get worse over time. You may have drop attacks or severe dizziness that makes you fall. A drop attack is when you suddenly fall without losing consciousness and you quickly recover after a few seconds or minutes.  What are the causes? This condition is caused by having too much of the fluid that is in your inner ear (endolymph). When fluid builds up in your inner ear, it affects the nerves that control balance and hearing. The reason for the fluid buildup is not known. Possible causes include:  Allergies.  An abnormal reaction of the body's defense system (autoimmune disease).  Viral infection of the inner ear.  Head injury. What increases the risk? You are more likely to develop this condition if:  You are older than age 82.  You have a family history of Meniere disease.  You have a history of autoimmune disease.  You have a history of migraine headaches. What are the signs or symptoms? Symptoms of this condition can come and go and may last for up to 4 hours at a time. Symptoms usually start in one ear. They may become more frequent and eventually involve both ears. Symptoms can include:  Fullness and pressure in your ear.  Roaring or ringing in your ear.  Vertigo and loss of balance.  Dizziness.  Decreased hearing.  Nausea and vomiting. How is this diagnosed? This condition is diagnosed based on:  A physical exam.  Tests , such as: ? A hearing test (audiogram). ? An electronystagmogram. This tests your balance nerve (vestibular nerve). ? Imaging studies of your inner ear, such as CT scan or MRI. ? Other balance tests, such as rotational or balance platform tests. How is this treated? There is no cure for this condition, but treatment can help to manage your symptoms. Treatment may include:  A low-salt diet. Limiting salt may help to reduce fluid in the body and relieve symptoms.  Oral or injected medicines to reduce or control: ? Vertigo. ? Nausea. ? Fluid retention. ? Dizziness.  Use of an air pressure pulse generator. This is a machine that sends small pressure pulses into your ear canal.  Hearing aids.  Inner ear surgery. This is rare. When you have symptoms,  it can be helpful to lie down on a flat surface and focus your eyes on one object that does not move. Try to stay in that position until your symptoms go away. Follow these instructions at home: Eating and drinking  Eat the same amount of food at the same time every day, including snacks.  Do not skip meals.  Avoid caffeine.  Drink enough fluids to keep your urine clear or pale yellow.  Limit alcoholic drinks to one drink a day for non-pregnant women and 2 drinks a day for men. One drink equals 12 oz of beer, 5 oz of wine, or 1 oz of hard liquor.  Limit the salt (sodium) in your diet as told by your health care provider. Check ingredients and nutrition facts on packaged foods and beverages.  Do not eat foods that contain monosodium glutamate (MSG). General instructions  Do not use any products that contain nicotine or tobacco, such as cigarettes and e-cigarettes. If you need help quitting, ask your health care provider.  Take over-the-counter and  prescription medicines only as told by your health care provider.  Find ways to reduce or avoid stress. If you need help with this, ask your health care provider.  Do not drive if you have vertigo or dizziness. Contact a health care provider if:  You have symptoms that last longer than 4 hours.  You have new or worse symptoms. Get help right away if:  You have been vomiting for 24 hours.  You cannot keep fluids down.  You have chest pain or trouble breathing. Summary  Meniere disease is an inner ear disorder. It causes attacks of a spinning sensation (vertigo), dizziness, and ringing in the ear (tinnitus). It also causes hearing loss and a feeling of fullness or pressure in the ear.  Symptoms of this condition can come and go and may last for up to 4 hours at a time.  When you have symptoms, it can be helpful to lie down on a flat surface and focus your eyes on one object that does not move. Try to stay in that position until  your symptoms go away. This information is not intended to replace advice given to you by your health care provider. Make sure you discuss any questions you have with your health care provider. Document Released: 02/25/2000 Document Revised: 01/19/2016 Document Reviewed: 01/19/2016 Elsevier Interactive Patient Education  2019 [employer].

## 2018-05-28 NOTE — Progress Notes (Signed)
BP 135/79   Pulse 69   Temp 97.6 F (36.4 C) (Oral)   Ht 5\' 6"  (1.676 m)   SpO2 98%   BMI 33.25 kg/m    Subjective:    Patient ID: Tammy [name], female    DOB: 1945/04/21, 73 y.o.   MRN: [id number]  HPI: Tammy [name] is a 73 y.o. female  Chief Complaint  Patient presents with  . Follow-up    Nause continues. Bowel movements now less, Goes to [facility] tomorrow.    Having cramping in her belly when she wakes her up in the AM. She is seeing GI tomorrow. She does note that her brother recently had surgery for colon cancer.   KNEE PAIN- Having some pain behind her L knee Duration: couple of weeks ago Involved knee: bilateral L>R Mechanism of injury: going up and down steps more often Location:posterior Onset: gradual Severity: severe  Quality:  Aching and sore Frequency: constant Radiation: no Aggravating factors: stairs  Alleviating factors: rest  Status: better Treatments attempted: rest  Relief with NSAIDs?:  no Weakness with weight bearing or walking: no Sensation of giving way: yes Locking: yes Popping: no Bruising: no Swelling: no Redness: no Paresthesias/decreased sensation: no Fevers: no  TINNITUS Duration: 2 weeks Description of tinnitus: crickets Pulsatile: yes Tinnitus duration: continuous Episode frequency: continous Severity: better in the last week Head injury: no Chronic exposure to loud noises: no Exposure to ototoxic medications: no Vertigo:yes Hearing loss: no Aural fullness: yes Headache:yes  TMJ syndrome symptoms: no Unsteady gait: yes Postural instability: yes Diplopia, dysarthria, dysphagia or weakness: no Anxietydepression: no   Relevant past medical, surgical, family and social history reviewed and updated as indicated. Interim medical history since our last visit reviewed. Allergies and medications reviewed and updated.  Review of Systems  Constitutional: Negative.   HENT: Positive for tinnitus. Negative for congestion, dental  problem, drooling, ear discharge, ear pain, facial swelling, hearing loss, mouth sores, nosebleeds, postnasal drip, rhinorrhea, sinus pressure, sinus pain, sneezing, sore throat, trouble swallowing and voice change.   Respiratory: Negative.   Cardiovascular: Negative.   Musculoskeletal: Positive for arthralgias and gait problem. Negative for back pain, joint swelling, myalgias, neck pain and neck stiffness.  Skin: Negative.   Neurological: Positive for dizziness and light-headedness. Negative for tremors, seizures, syncope, facial asymmetry, speech difficulty, weakness, numbness and headaches.  Hematological: Negative.   Psychiatric/Behavioral: Negative.     Per HPI unless specifically indicated above     Objective:    BP 135/79   Pulse 69   Temp 97.6 F (36.4 C) (Oral)   Ht 5\' 6"  (1.676 m)   SpO2 98%   BMI 33.25 kg/m   Wt Readings from Last 3 Encounters:  05/21/18 206 lb (93.4 kg)  05/14/18 213 lb (96.6 kg)  03/19/18 214 lb (97.1 kg)    Physical Exam Vitals signs and nursing note reviewed.  Constitutional:      General: She is not in acute distress.    Appearance: Normal appearance. She is not ill-appearing, toxic-appearing or diaphoretic.  HENT:     Head: Normocephalic and atraumatic.     Right Ear: Tympanic membrane, ear canal and external ear normal. There is no impacted cerumen.     Left Ear: Tympanic membrane, ear canal and external ear normal. There is no impacted cerumen.     Nose: No congestion or rhinorrhea.     Mouth/Throat:     Mouth: Mucous membranes are moist.     Pharynx: Oropharynx  is clear. No oropharyngeal exudate or posterior oropharyngeal erythema.  Eyes:     General:        Right eye: No discharge.        Left eye: No discharge.     Extraocular Movements: Extraocular movements intact.     Conjunctiva/sclera: Conjunctivae normal.     Pupils: Pupils are equal, round, and reactive to light.  Neck:     Musculoskeletal: Normal range of motion and neck  supple. No neck rigidity or muscular tenderness.     Vascular: No carotid bruit.  Cardiovascular:     Rate and Rhythm: Normal rate and regular rhythm.     Pulses: Normal pulses.     Heart sounds: Normal heart sounds. No murmur. No friction rub. No gallop.   Pulmonary:     Effort: Pulmonary effort is normal. No respiratory distress.     Breath sounds: Normal breath sounds. No stridor. No wheezing, rhonchi or rales.  Chest:     Chest wall: No tenderness.  Musculoskeletal: Normal range of motion.        General: Swelling (some mild swelling behind L knee- feels like a baker's cyst) and tenderness present. No deformity or signs of injury.     Right lower leg: No edema.     Left lower leg: No edema.  Lymphadenopathy:     Cervical: No cervical adenopathy.  Skin:    General: Skin is warm and dry.     Capillary Refill: Capillary refill takes less than 2 seconds.     Coloration: Skin is not jaundiced or pale.     Findings: No bruising, erythema, lesion or rash.  Neurological:     General: No focal deficit present.     Mental Status: She is alert and oriented to person, place, and time. Mental status is at baseline.     Cranial Nerves: No cranial nerve deficit.     Sensory: No sensory deficit.     Motor: No weakness.     Coordination: Coordination normal.     Gait: Gait normal.     Deep Tendon Reflexes: Reflexes normal.  Psychiatric:        Mood and Affect: Mood normal.        Behavior: Behavior normal.        Thought Content: Thought content normal.        Judgment: Judgment normal.     Results for orders placed or performed in visit on 05/22/18  Ova and parasite examination  Result Value Ref Range   OVA + PARASITE EXAM Final report    O&P result 1 Comment   Fecal occult blood, imunochemical([facility])  Result Value Ref Range   Fecal Occult Bld Positive (A) Negative  Stool C-Diff Toxin Assay  Result Value Ref Range   C difficile Toxins A+B, EIA Negative Negative  Fecal  leukocytes  Result Value Ref Range   White Blood Cells (WBC), Stool WILL FOLLOW   Stool Culture  Result Value Ref Range   Salmonella/Shigella Screen Final report    Stool Culture result 1 ([facility]) Comment    Campylobacter Culture Final report    Stool Culture result 1 ([facility]) Comment    E coli, Shiga toxin Assay Negative Negative      Assessment & Plan:   Problem List Items Addressed This Visit    None    Visit Diagnoses    Synovial cyst of left popliteal space    -  Primary   Will rest as needed.  Does not want to see orthopedics right now. Call with any concerns.    Tinnitus of right ear       Will get her into ENT for evaluation meclizine for vertigo at this time. Call with any concerns.    Relevant Orders   Ambulatory referral to ENT   Vertigo       Will get her into ENT for evaluation meclizine for vertigo at this time. Call with any concerns.    Relevant Orders   Ambulatory referral to ENT   Occult blood in stools       Seeing GI tomorrow. Stool studies have been normal. Call with any concerns.        Follow up plan: Return in about 3 months (around 08/28/2018) for diabetes follow up.

## 2018-05-29 ENCOUNTER — Other Ambulatory Visit: Payer: Self-pay

## 2018-05-29 ENCOUNTER — Ambulatory Visit (INDEPENDENT_AMBULATORY_CARE_PROVIDER_SITE_OTHER): Payer: Medicare Other | Admitting: Gastroenterology

## 2018-05-29 ENCOUNTER — Encounter: Payer: Self-pay | Admitting: Gastroenterology

## 2018-05-29 VITALS — BP 108/68 | HR 76 | Ht 66.0 in | Wt 210.4 lb

## 2018-05-29 DIAGNOSIS — R110 Nausea: Secondary | ICD-10-CM

## 2018-05-29 DIAGNOSIS — R1310 Dysphagia, unspecified: Secondary | ICD-10-CM

## 2018-05-29 DIAGNOSIS — R195 Other fecal abnormalities: Secondary | ICD-10-CM | POA: Diagnosis not present

## 2018-05-29 DIAGNOSIS — K5900 Constipation, unspecified: Secondary | ICD-10-CM

## 2018-05-29 LAB — FECAL LEUKOCYTES

## 2018-05-29 LAB — STOOL CULTURE: E coli, Shiga toxin Assay: NEGATIVE

## 2018-05-29 NOTE — Patient Instructions (Addendum)
Metamucil daily (over the counter) F/u 3 months   High-Fiber Diet Fiber, also called dietary fiber, is a type of carbohydrate that is found in fruits, vegetables, whole grains, and beans. A high-fiber diet can have many health benefits. Your health care provider may recommend a high-fiber diet to help:  Prevent constipation. Fiber can make your bowel movements more regular.  Lower your cholesterol.  Relieve the following conditions: ? Swelling of veins in the anus (hemorrhoids). ? Swelling and irritation (inflammation) of specific areas of the digestive tract (uncomplicated diverticulosis). ? A problem of the large intestine (colon) that sometimes causes pain and diarrhea (irritable bowel syndrome, IBS).  Prevent overeating as part of a weight-loss plan.  Prevent heart disease, type 2 diabetes, and certain cancers. What is my plan? The recommended daily fiber intake in grams (g) includes:  38 g for men age 82 or younger.  30 g for men over age 29.  60 g for women age 39 or younger.  21 g for women over age 33. You can get the recommended daily intake of dietary fiber by:  Eating a variety of fruits, vegetables, grains, and beans.  Taking a fiber supplement, if it is not possible to get enough fiber through your diet. What do I need to know about a high-fiber diet?  It is better to get fiber through food sources rather than from fiber supplements. There is not a lot of research about how effective supplements are.  Always check the fiber content on the nutrition facts label of any prepackaged food. Look for foods that contain 5 g of fiber or more per serving.  Talk with a diet and nutrition specialist (dietitian) if you have questions about specific foods that are recommended or not recommended for your medical condition, especially if those foods are not listed below.  Gradually increase how much fiber you consume. If you increase your intake of dietary fiber too quickly,  you may have bloating, cramping, or gas.  Drink plenty of water. Water helps you to digest fiber. What are tips for following this plan?  Eat a wide variety of high-fiber foods.  Make sure that half of the grains that you eat each day are whole grains.  Eat breads and cereals that are made with whole-grain flour instead of refined flour or white flour.  Eat brown rice, bulgur wheat, or millet instead of white rice.  Start the day with a breakfast that is high in fiber, such as a cereal that contains 5 g of fiber or more per serving.  Use beans in place of meat in soups, salads, and pasta dishes.  Eat high-fiber snacks, such as berries, raw vegetables, nuts, and popcorn.  Choose whole fruits and vegetables instead of processed forms like juice or sauce. What foods can I eat?  Fruits Berries. Pears. Apples. Oranges. Avocado. Prunes and raisins. Dried figs. Vegetables Sweet potatoes. Spinach. Kale. Artichokes. Cabbage. Broccoli. Cauliflower. Green peas. Carrots. Squash. Grains Whole-grain breads. Multigrain cereal. Oats and oatmeal. Brown rice. Barley. Bulgur wheat. White. Quinoa. Bran muffins. Popcorn. Rye wafer crackers. Meats and other proteins Navy, kidney, and pinto beans. Soybeans. Split peas. Lentils. Nuts and seeds. Dairy Fiber-fortified yogurt. Beverages Fiber-fortified soy milk. Fiber-fortified orange juice. Other foods Fiber bars. The items listed above may not be a complete list of recommended foods and beverages. Contact a dietitian for more options. What foods are not recommended? Fruits Fruit juice. Cooked, strained fruit. Vegetables Fried potatoes. Canned vegetables. Well-cooked vegetables. Grains White bread.  Pasta made with refined flour. White rice. Meats and other proteins Fatty cuts of meat. Fried chicken or fried fish. Dairy Milk. Yogurt. Cream cheese. Sour cream. Fats and oils Butters. Beverages Soft drinks. Other foods Cakes and  pastries. The items listed above may not be a complete list of foods and beverages to avoid. Contact a dietitian for more information. Summary  Fiber is a type of carbohydrate. It is found in fruits, vegetables, whole grains, and beans.  There are many health benefits of eating a high-fiber diet, such as preventing constipation, lowering blood cholesterol, helping with weight loss, and reducing your risk of heart disease, diabetes, and certain cancers.  Gradually increase your intake of fiber. Increasing too fast can result in cramping, bloating, and gas. Drink plenty of water while you increase your fiber.  The best sources of fiber include whole fruits and vegetables, whole grains, nuts, seeds, and beans. This information is not intended to replace advice given to you by your health care provider. Make sure you discuss any questions you have with your health care provider. Document Released: 02/27/2005 Document Revised: 01/01/2017 Document Reviewed: 01/01/2017 Elsevier Interactive Patient Education  2019 [employer].

## 2018-05-29 NOTE — Progress Notes (Signed)
Tammy [name] [address]  [place],  [address]  Main: [phone]  Fax: [phone]   Gastroenterology Consultation  Referring Provider:     [name], DO Primary Care Physician:  [name], DO Reason for Consultation:     Loose stools, constipation        HPI:    Chief Complaint  Patient presents with  . New Patient (Initial Visit)    referred diarrhea. pt states she had bm everytime she voided and now she has BM every 3 days, abdominal pain. mother died with colon cancer and brother has colon cancer.    Tammy [name] is a 73 y.o. y/o female referred for consultation & management  by Dr. Wynetta Emery, Megan P, DO.  Patient reports intermittent loose stools, but reports that mostly she is constipated and can go 3 to 4 days without a bowel movement.  And then will have some loose stool output when she goes to urinate.  This has been a chronic symptom and not new. Patient has been previously seen for the same.  Had a colonoscopy in March 2017 due to chronic diarrhea as well.  6 subcentimeter polyps were removed.  Terminal ileum was examined and appeared normal.  Diverticulosis and internal hemorrhoids were reported.  I do not have the pathology report available, but letter sent to patient under letters tab shows repeat was recommended in 5 years due to precancerous polyps.  Patient recently underwent stool testing by primary care provider in stool for C. difficile, fecal leukocytes, stool culture and ova and parasites were negative.  Fecal occult blood test was positive.  Patient also reports intermittent regurgitation and nausea without vomiting.  No hematemesis.  Past Medical History:  Diagnosis Date  . Asthma   . Benign neoplasm of colon   . Cervical disc disease    "bulging" - no limitations per pt  . Chronic diastolic CHF (congestive heart failure) ([place])    a. 07/2012 Echo: Nl EF. mild inferoseptal HK, Gr 2 DD, mild conc LVH, mild PR/TR,  mild PAH.  . CKD (chronic kidney disease), stage III ([place])   . COPD (chronic obstructive pulmonary disease) ([place])   . Coronary artery disease    a. 11/2011 Cath: LAD 83md, LCX min irregs, RCA 70p, 1098mith L->R collats, EF 60%-->Med Rx.  . Diabetes mellitus without complication ([facility]  . Fusion of lumbar spine 12/22/2015  . GERD (gastroesophageal reflux disease)   . History of DVT (deep vein thrombosis)   . History of tobacco abuse    a. Quit 2011.  . Marland Kitchenyperlipidemia   . Hypertension   . Hypothyroidism   . Obesity   . Palpitations   . PE (pulmonary embolism) 10/15  . PONV (postoperative nausea and vomiting)   . PVD (peripheral vascular disease) ([facility]   a. PTA of right leg and left femoral artery with stenosis.  . Renal insufficiency   . [facility]   TIAs - no deficits    Past Surgical History:  Procedure Laterality Date  . ANGIOPLASTY / STENTING FEMORAL     PVD; angioplasty right leg and left femoral artery with stenosis.   . APPENDECTOMY  Oct. 2016  . BACK SURGERY  03/2012  . BACK SURGERY     screws, rods replaced with new hardware.  . Marland KitchenACK SURGERY  11/2016  . CARDIAC CATHETERIZATION  12/07/2011   Mid LAD 50%, distal LAD 50%, mid RCA 70%, distal RCA  100%   . CARDIAC CATHETERIZATION  01/04/2011   100% occluded mid RCA with good collaterals from distal LAD, normal LVEF.  [name] CHOLECYSTECTOMY    . COLONOSCOPY  2013  . COLONOSCOPY WITH PROPOFOL N/A 05/24/2015   Procedure: COLONOSCOPY WITH PROPOFOL;  Surgeon: [name], MD;  Location: [facility];  Service: Endoscopy;  Laterality: N/A;  Diabetic - oral meds  . LAPAROSCOPIC APPENDECTOMY N/A 01/06/2015   Procedure: APPENDECTOMY LAPAROSCOPIC;  Surgeon: [name], MD;  Location: [facility];  Service: General;  Laterality: N/A;  . POLYPECTOMY  05/24/2015   Procedure: POLYPECTOMY;  Surgeon: [name], MD;  Location: [facility];  Service: Endoscopy;;  . THYROIDECTOMY    . TRIGGER FINGER RELEASE       Prior to Admission medications   Medication Sig Start Date End Date Taking? Authorizing Provider  albuterol (PROAIR HFA) 108 (90 Base) MCG/ACT inhaler INHALE 2 PUFFS EVERY 4 HOURS AS NEEDED FOR WHEEZING OR SHORTNESS OF BREATH 02/27/18  Yes Johnson, Megan P, DO  apixaban (ELIQUIS) 5 MG TABS tablet Take 1 tablet (5 mg total) by mouth 2 (two) times daily. 06/04/17  Yes [name], MD  blood glucose meter kit and supplies KIT Dispense based on patient and insurance preference. Use one time daily as directed, E11.9 04/23/15  Yes Lada, Satira Anis, MD  Dulaglutide (TRULICITY) 1.5 CB/6.3AG SOPN Inject 1.5 mg into the skin once a week. 01/04/18  Yes Johnson, Megan P, DO  DULoxetine (CYMBALTA) 60 MG capsule Take 1 capsule (60 mg total) by mouth daily. 05/21/18  Yes Johnson, Megan P, DO  gabapentin (NEURONTIN) 300 MG capsule Take 1 capsule (300 mg total) by mouth 3 (three) times daily. 11/13/17  Yes Johnson, Megan P, DO  glipiZIDE (GLUCOTROL) 5 MG tablet TAKE ONE TABLET EVERY DAY BEFORE BREAKFAST 05/21/18  Yes Johnson, Megan P, DO  isosorbide mononitrate (IMDUR) 60 MG 24 hr tablet Take 1 tablet (60 mg total) by mouth as directed. Take 1 tablet at bedtime and 1/2 tablet in the morning 11/13/17  Yes Johnson, Megan P, DO  levothyroxine (SYNTHROID) 150 MCG tablet Take 1 tablet (150 mcg total) by mouth daily before breakfast. 02/21/18  Yes Johnson, Megan P, DO  losartan (COZAAR) 50 MG tablet Take 1 tablet (50 mg total) by mouth daily. 11/13/17  Yes Johnson, Megan P, DO  meclizine (ANTIVERT) 25 MG tablet Take 1 tablet (25 mg total) by mouth 3 (three) times daily as needed for dizziness. 05/28/18  Yes Johnson, Megan P, DO  metoprolol succinate (TOPROL-XL) 50 MG 24 hr tablet TAKE 1 TABLET BY MOUTH DAILY WITH OR IMMEDIATLEY FOLLOWING A MEAL 11/13/17  Yes Johnson, Megan P, DO  montelukast (SINGULAIR) 10 MG tablet Take 1 tablet (10 mg total) by mouth at bedtime. 05/14/18  Yes Cannady, Jolene T, NP  nitroGLYCERIN (NITROSTAT) 0.4 MG  SL tablet Place 1 tablet (0.4 mg total) under the tongue every 5 (five) minutes as needed for chest pain. 06/04/17  Yes Gollan, [name], MD  ondansetron (ZOFRAN) 8 MG tablet Take 1 tablet (8 mg total) by mouth every 8 (eight) hours as needed for nausea or vomiting. 05/28/18  Yes Johnson, Megan P, DO  pantoprazole (PROTONIX) 40 MG tablet Take 1 tablet (40 mg total) by mouth daily as needed. 05/21/18  Yes Johnson, Megan P, DO  rosuvastatin (CRESTOR) 40 MG tablet Take 1 tablet (40 mg total) by mouth daily. 11/13/17  Yes Johnson, Megan P, DO  trimethoprim (TRIMPEX) 100 MG tablet Take  1 tablet (100 mg total) by mouth daily. 11/29/17  Yes Stoioff, Ronda Fairly, MD    Family History  Problem Relation Age of Onset  . Hypertension Father   . Heart disease Father   . Heart attack Father   . Diabetes Father   . Cancer Mother        colon  . Heart attack Brother   . Diabetes Brother   . Hypertension Brother   . Heart disease Brother   . Heart attack Brother   . Diabetes Brother   . Hypertension Brother   . Heart disease Brother   . Heart attack Brother   . Diabetes Brother   . Hypertension Brother   . Heart disease Brother   . Cancer Sister        lung  . COPD Sister   . COPD Sister      Social History   Tobacco Use  . Smoking status: Former Smoker    Packs/day: 0.50    Years: 15.00    Pack years: 7.50    Types: Cigarettes    Last attempt to quit: 03/13/2009    Years since quitting: 9.2  . Smokeless tobacco: Never Used  Substance Use Topics  . Alcohol use: No    Alcohol/week: 0.0 standard drinks  . Drug use: No    Allergies as of 05/29/2018 - Review Complete 05/29/2018  Allergen Reaction Noted  . Hydrocodone Other (See Comments) 12/29/2016  . Aleve [naproxen sodium]  06/13/2013  . Contrast media [iodinated diagnostic agents]  05/19/2015  . Ioxaglate Other (See Comments) 10/01/2015  . Oxycodone Other (See Comments) 01/15/2014  . Ranexa [ranolazine]  06/13/2013  . Sulfa antibiotics  Other (See Comments) 05/19/2015  . Tramadol Other (See Comments) 01/15/2014    Review of Systems:    All systems reviewed and negative except where noted in HPI.   Physical Exam:  BP 108/68   Pulse 76   Ht _0  (1.676 m)   Wt 210 lb 6.4 oz (95.4 kg)   BMI 33.96 kg/m  No LMP recorded. Patient is postmenopausal. Psych:  Alert and cooperative. Normal mood and affect. General:   Alert,  Well-developed, well-nourished, pleasant and cooperative in NAD Head:  Normocephalic and atraumatic. Eyes:  Sclera clear, no icterus.   Conjunctiva pink. Ears:  Normal auditory acuity. Nose:  No deformity, discharge, or lesions. Mouth:  No deformity or lesions,oropharynx pink & moist. Neck:  Supple; no masses or thyromegaly. Abdomen:  Normal bowel sounds.  No bruits.  Soft, non-tender and non-distended without masses, hepatosplenomegaly or hernias noted.  No guarding or rebound tenderness.    Msk:  Symmetrical without gross deformities. Good, equal movement & strength bilaterally. Pulses:  Normal pulses noted. Extremities:  No clubbing or edema.  No cyanosis. Neurologic:  Alert and oriented x3;  grossly normal neurologically. Skin:  Intact without significant lesions or rashes. No jaundice. Lymph Nodes:  No significant cervical adenopathy. Psych:  Alert and cooperative. Normal mood and affect.   Labs: CBC    Component Value Date/Time   WBC 11.2 (H) 05/21/2018 1355   WBC 9.2 03/11/2018 1142   RBC 4.89 05/21/2018 1355   RBC 4.76 03/11/2018 1142   HGB 14.2 05/21/2018 1355   HCT 43.3 05/21/2018 1355   PLT 285 05/21/2018 1355   MCV 89 05/21/2018 1355   MCH 29.0 05/21/2018 1355   MCH 28.4 03/11/2018 1142   MCHC 32.8 05/21/2018 1355   MCHC 30.7 03/11/2018 1142   RDW 13.0  05/21/2018 1355   LYMPHSABS 2.9 05/21/2018 1355   MONOABS 0.9 12/17/2014 1017   EOSABS 0.2 05/21/2018 1355   BASOSABS 0.1 05/21/2018 1355   CMP     Component Value Date/Time   NA 138 05/21/2018 1355   NA 140 09/20/2011  1134   K 6.0 (H) 05/21/2018 1355   K 4.4 09/20/2011 1134   CL 100 05/21/2018 1355   CL 103 09/20/2011 1134   CO2 22 05/21/2018 1355   CO2 28 09/20/2011 1134   GLUCOSE 138 (H) 05/21/2018 1355   GLUCOSE 186 (H) 03/11/2018 1142   GLUCOSE 139 (H) 09/20/2011 1134   BUN 28 (H) 05/21/2018 1355   BUN 17 09/20/2011 1134   CREATININE 1.59 (H) 05/21/2018 1355   CREATININE 1.31 (H) 09/20/2011 1134   CALCIUM 9.3 05/21/2018 1355   CALCIUM 8.4 (L) 09/20/2011 1134   PROT 7.2 05/21/2018 1355   ALBUMIN 4.4 05/21/2018 1355   AST 17 05/21/2018 1355   ALT 11 05/21/2018 1355   ALKPHOS 75 05/21/2018 1355   BILITOT 0.5 05/21/2018 1355   GFRNONAA 32 (L) 05/21/2018 1355   GFRNONAA 42 (L) 09/20/2011 1134   GFRAA 37 (L) 05/21/2018 1355   GFRAA 48 (L) 09/20/2011 1134    Imaging Studies: No results found.  Assessment and Plan:   Tammy [name] is a 73 y.o. y/o female has been referred for diarrhea  Patient's diarrhea is chronic and she has had work-up for this in the past She likely has postobstructive diarrhea since her predominant symptom is constipation and she is having fecal smearing with urination after she has not had a bowel movement for 3 to 4 days  Her last colonoscopy done in 2017 for the diarrhea did not reveal any lesions to attribute to her etiology Therefore, repeat colonoscopy for the same reason is unlikely to reveal any new diagnoses  High-fiber diet Metamucil daily with goal of 1-2 soft bowel movements daily.  If not at goal, patient instructed to increase dose to twice daily.  If loose stools with the medication, patient asked to decrease the medication to every other day, or half dose daily.  Patient verbalized understanding  Positive fecal occult blood test was noted and discussed.  However, fecal occult blood testing should not be done when patient is following with surveillance colonoscopies as scheduled.  Her next surveillance colonoscopy is to be done in 5 years from March 2017.   In addition, due to the current coronavirus situation, [facility] policy is to reschedule elective procedures at this time.  A colonoscopy in the setting, that is in her case is not emergent or urgent, and would put an elderly patient at risk of exposure in an healthcare setting.  Therefore, we we discussed the indications, and risks and benefits of colonoscopy in detail and patient and family would refer to wait for any procedures at this time as well.  We will have her follow-up with [place] in clinic in a few months to reassess her symptoms and rediscuss colonoscopy if symptoms continue.  Due to her positive fecal occult blood test, we may need to do a colonoscopy sooner than 5 years, although the likelihood is that the fecal occult blood testing is positive due to hemorrhoids or is false positive, and less likely to be due to colon cancer given recent colonoscopy.  We will also order an upper GI study at this time to evaluate her esophagus and stomach given her intermittent nausea and regurgitation.  Patient and family  verbalized understanding and state they will follow-up in the near future scheduled.   Dr Tammy [name]  Speech recognition software was used to dictate the above note.

## 2018-05-30 ENCOUNTER — Telehealth: Payer: Self-pay

## 2018-05-30 NOTE — Addendum Note (Signed)
Addended by: Earl Lagos on: 05/30/2018 02:29 PM   Modules accepted: Orders

## 2018-05-30 NOTE — Telephone Encounter (Signed)
I informed pt that her UGI Study was scheduled at [facility] (they do not do these at [facility] location). DATE: 07/03/2018 TIME: 8:30 am (to arrive 15 min early to check in at medical mall entrance) Nothing to eat/drink after midnight.

## 2018-06-03 ENCOUNTER — Other Ambulatory Visit: Payer: Self-pay | Admitting: Family Medicine

## 2018-06-03 MED ORDER — TRIMETHOPRIM 100 MG PO TABS: 100.0000 mg | ORAL_TABLET | Freq: Every day | ORAL | 6 refills | Status: DC

## 2018-06-11 ENCOUNTER — Telehealth: Payer: Self-pay | Admitting: Family Medicine

## 2018-06-11 DIAGNOSIS — N644 Mastodynia: Secondary | ICD-10-CM

## 2018-06-11 NOTE — Telephone Encounter (Signed)
Copied from [place] [id number]. Topic: Referral - Status >> Jun 11, 2018  4:24 PM Parke Poisson wrote: Reason for CRM: [facility] states they will need an order for Diagnostic bilateral mammogram [id number] along with right breat limited ultrasound [id number].Diagnosis that was previously was N64.4 (ICD-10-CM) - Breast pain, left

## 2018-06-12 ENCOUNTER — Telehealth: Payer: Self-pay | Admitting: Family Medicine

## 2018-06-12 NOTE — Telephone Encounter (Signed)
Patient would like to know if you thought she could try lyrica for her back pain, states her son has started taking it and it has really helped him to be able to walk.  She said she would like to know how you felt about it.  Thank you  Please advise

## 2018-06-12 NOTE — Telephone Encounter (Signed)
Thanks for entering new order but per [facility] all diagnostic mammograms need to be TOMO

## 2018-06-13 NOTE — Telephone Encounter (Signed)
Spoke with patient. Scheduled for virtual visit 06/13/2018 with Dr Wynetta Emery @ 10:00

## 2018-06-13 NOTE — Addendum Note (Signed)
Addended by: [name] on: 06/13/2018 09:42 AM   Modules accepted: Orders

## 2018-06-13 NOTE — Telephone Encounter (Signed)
We can definitely discuss this- please schedule her a virtual visit.

## 2018-06-13 NOTE — Telephone Encounter (Addendum)
It is right in her nipple.

## 2018-06-13 NOTE — Telephone Encounter (Signed)
[place] needs to know what clock position of breast pain in order to schedule appointment

## 2018-06-14 ENCOUNTER — Encounter: Payer: Self-pay | Admitting: Family Medicine

## 2018-06-14 ENCOUNTER — Ambulatory Visit (INDEPENDENT_AMBULATORY_CARE_PROVIDER_SITE_OTHER): Payer: Medicare Other | Admitting: Family Medicine

## 2018-06-14 ENCOUNTER — Other Ambulatory Visit: Payer: Self-pay

## 2018-06-14 VITALS — BP 125/80 | HR 79 | Ht 66.0 in | Wt 209.0 lb

## 2018-06-14 DIAGNOSIS — I209 Angina pectoris, unspecified: Secondary | ICD-10-CM

## 2018-06-14 DIAGNOSIS — M48061 Spinal stenosis, lumbar region without neurogenic claudication: Secondary | ICD-10-CM

## 2018-06-14 MED ORDER — PREGABALIN 75 MG PO CAPS: ORAL_CAPSULE | ORAL | 3 refills | Status: DC

## 2018-06-14 NOTE — Assessment & Plan Note (Signed)
Not doing well with a significant amount of pain. Already on cymbalta and gabapentin. Not a surgical or injection candidate. Will start her on lyrica and slowly titrate up. Recheck 2 weeks to see how she's doing.

## 2018-06-14 NOTE — Progress Notes (Signed)
BP 125/80   Pulse 79   Ht 5\' 6"  (1.676 m)   Wt 209 lb (94.8 kg)   BMI 33.73 kg/m    Subjective:    Patient ID: Tammy [name], female    DOB: [birth year], 73 y.o.   MRN: [id number]  HPI: Tammy [name] is a 73 y.o. female  Chief Complaint  Patient presents with  . Back Pain    lower back. pt has questions about lyrica  . Leg Pain    bilateral  . TELEMEDICINE VISIT   NEUROPATHY Neuropathy status: exacerbated  Satisfied with current treatment?: no Medication side effects: no Medication compliance:  excellent compliance Location: low back and legs bilaterally Pain: yes Severity: moderate  Quality:  Shooting, numb, tingling, aching, sore Frequency: constant Bilateral: yes Symmetric: yes Numbness: yes Decreased sensation: yes Weakness: yes Context: worse  Relevant past medical, surgical, family and social history reviewed and updated as indicated. Interim medical history since our last visit reviewed. Allergies and medications reviewed and updated.  Review of Systems  Constitutional: Negative.   Respiratory: Negative.   Cardiovascular: Negative.   Musculoskeletal: Positive for back pain, gait problem and myalgias. Negative for arthralgias, joint swelling, neck pain and neck stiffness.  Skin: Negative.   Neurological: Positive for weakness and numbness. Negative for dizziness, tremors, seizures, syncope, facial asymmetry, speech difficulty, light-headedness and headaches.  Psychiatric/Behavioral: Negative.     Per HPI unless specifically indicated above     Objective:    BP 125/80   Pulse 79   Ht 5\' 6"  (1.676 m)   Wt 209 lb (94.8 kg)   BMI 33.73 kg/m   Wt Readings from Last 3 Encounters:  06/14/18 209 lb (94.8 kg)  05/29/18 210 lb 6.4 oz (95.4 kg)  05/21/18 206 lb (93.4 kg)    Physical Exam Vitals signs and nursing note reviewed.  Constitutional:      General: She is not in acute distress.    Appearance: Normal appearance. She is not ill-appearing,  toxic-appearing or diaphoretic.  HENT:     Head: Normocephalic and atraumatic.     Right Ear: External ear normal.     Left Ear: External ear normal.     Nose: Nose normal.     Mouth/Throat:     Mouth: Mucous membranes are moist.     Pharynx: Oropharynx is clear.  Eyes:     General: No scleral icterus.       Right eye: No discharge.        Left eye: No discharge.     Conjunctiva/sclera: Conjunctivae normal.     Pupils: Pupils are equal, round, and reactive to light.  Neck:     Musculoskeletal: Normal range of motion.  Pulmonary:     Effort: Pulmonary effort is normal. No respiratory distress.     Comments: Speaking in full sentences Musculoskeletal: Normal range of motion.  Skin:    Coloration: Skin is not jaundiced or pale.     Findings: No bruising, erythema, lesion or rash.  Neurological:     Mental Status: She is alert and oriented to person, place, and time. Mental status is at baseline.  Psychiatric:        Mood and Affect: Mood normal.        Behavior: Behavior normal.        Thought Content: Thought content normal.        Judgment: Judgment normal.     Results for orders placed or performed in visit on  05/22/18  Ova and parasite examination  Result Value Ref Range   OVA + PARASITE EXAM Final report    O&P result 1 Comment   Fecal occult blood, imunochemical([facility])  Result Value Ref Range   Fecal Occult Bld Positive (A) Negative  Stool C-Diff Toxin Assay  Result Value Ref Range   C difficile Toxins A+B, EIA Negative Negative  Fecal leukocytes  Result Value Ref Range   White Blood Cells (WBC), Stool Final report None Seen   Result 1 Comment   Stool Culture  Result Value Ref Range   Salmonella/Shigella Screen Final report    Stool Culture result 1 ([facility]) Comment    Campylobacter Culture Final report    Stool Culture result 1 (CMP[facility]) Comment    E coli, Shiga toxin Assay Negative Negative      Assessment & Plan:   Problem List Items  Addressed This Visit      Other   Spinal stenosis of lumbar region - Primary    Not doing well with a significant amount of pain. Already on cymbalta and gabapentin. Not a surgical or injection candidate. Will start her on lyrica and slowly titrate up. Recheck 2 weeks to see how she's doing.           Follow up plan: Return in about 2 weeks (around 06/28/2018) for follow up lyrica.    . This visit was completed via FaceTime due to the restrictions of the COVID-19 pandemic. All issues as above were discussed and addressed. Physical exam was done as above through visual confirmation on FaceTime. If it was felt that the patient should be evaluated in the office, they were directed there. The patient verbally consented to this visit. . Location of the patient: home . Location of the provider: home . Those involved with this call:  . Provider: Park Liter, DO . CMA: Lesle Chris, [place] . Front Desk/Registration: Don Perking  . Time spent on call: 15 minutes with patient face to face via video conference. More than 50% of this time was spent in counseling and coordination of care.

## 2018-06-18 ENCOUNTER — Other Ambulatory Visit: Payer: Medicare Other

## 2018-06-18 ENCOUNTER — Inpatient Hospital Stay: Admission: RE | Admit: 2018-06-18 | Payer: Medicare Other | Source: Ambulatory Visit

## 2018-06-19 ENCOUNTER — Other Ambulatory Visit: Payer: Self-pay | Admitting: Cardiovascular Disease

## 2018-06-19 NOTE — Telephone Encounter (Signed)
Please review for refill.  

## 2018-07-02 ENCOUNTER — Encounter: Payer: Self-pay | Admitting: Family Medicine

## 2018-07-02 ENCOUNTER — Ambulatory Visit (INDEPENDENT_AMBULATORY_CARE_PROVIDER_SITE_OTHER): Payer: Medicare Other | Admitting: Family Medicine

## 2018-07-02 ENCOUNTER — Other Ambulatory Visit: Payer: Self-pay

## 2018-07-02 VITALS — BP 128/71 | HR 79 | Temp 97.8°F | Wt 209.0 lb

## 2018-07-02 DIAGNOSIS — I209 Angina pectoris, unspecified: Secondary | ICD-10-CM | POA: Diagnosis not present

## 2018-07-02 DIAGNOSIS — M48061 Spinal stenosis, lumbar region without neurogenic claudication: Secondary | ICD-10-CM

## 2018-07-02 NOTE — Patient Instructions (Signed)
Wednesday or Thursday increase to 2 tabs BID on the lyrica for 1 week, then 2 tabs in the AM, 1 tab in the afternoon and 2 tabs at bedtime for 1 week, then 2 tabs 3x a day after that.

## 2018-07-02 NOTE — Assessment & Plan Note (Signed)
Tolerating lyrica well. Not noticing a big difference in her symptoms yet. Will continue to taper up to 150mg  TID and recheck 3-4 weeks. Call with any concerns.

## 2018-07-02 NOTE — Progress Notes (Signed)
BP 128/71   Pulse 79   Temp 97.8 F (36.6 C)   Wt 209 lb (94.8 kg)   BMI 33.73 kg/m    Subjective:    Patient ID: Tammy [name], female    DOB: 10/06/1945, 73 y.o.   MRN: [id number]  HPI: Tammy [name] is a 73 y.o. female  Chief Complaint  Patient presents with  . Leg Pain    Lyrica follow up  . Back Pain   [name] presents today via telemedicine visit for follow up on her lyrica. She notes that the lyrica hasn't kicked in just yet where it's helping significantly. She is currently taking 2 at night and 1 in the AM. Hasn't been noticing much of a difference. No side effects though. She does note that she is sleeping well at night though. She states that her pain is about the same. Worse with movement and long standing. Better with sitting and rest. Continues with numbness and tingling. Pain is in her low back and legs. Pain is chronic. Radiates into her legs. It is aching and sore and shooting. She is feeling well otherwise. No other concerns or complaints at this time.   Relevant past medical, surgical, family and social history reviewed and updated as indicated. Interim medical history since our last visit reviewed. Allergies and medications reviewed and updated.  Review of Systems  Constitutional: Negative.   Respiratory: Negative.   Cardiovascular: Negative.   Musculoskeletal: Positive for back pain and myalgias. Negative for arthralgias, gait problem, joint swelling, neck pain and neck stiffness.  Skin: Negative.   Neurological: Positive for numbness. Negative for dizziness, tremors, seizures, syncope, facial asymmetry, speech difficulty, weakness, light-headedness and headaches.  Psychiatric/Behavioral: Negative.     Per HPI unless specifically indicated above     Objective:    BP 128/71   Pulse 79   Temp 97.8 F (36.6 C)   Wt 209 lb (94.8 kg)   BMI 33.73 kg/m   Wt Readings from Last 3 Encounters:  07/02/18 209 lb (94.8 kg)  06/14/18 209 lb (94.8 kg)  05/29/18 210  lb 6.4 oz (95.4 kg)    Physical Exam Vitals signs and nursing note reviewed.  Constitutional:      General: She is not in acute distress.    Appearance: Normal appearance. She is not ill-appearing, toxic-appearing or diaphoretic.  HENT:     Head: Normocephalic and atraumatic.     Right Ear: External ear normal.     Left Ear: External ear normal.     Nose: Nose normal.     Mouth/Throat:     Mouth: Mucous membranes are moist.     Pharynx: Oropharynx is clear.  Eyes:     General: No scleral icterus.       Right eye: No discharge.        Left eye: No discharge.     Conjunctiva/sclera: Conjunctivae normal.     Pupils: Pupils are equal, round, and reactive to light.  Neck:     Musculoskeletal: Normal range of motion.  Pulmonary:     Effort: Pulmonary effort is normal. No respiratory distress.     Comments: Speaking in full sentences Musculoskeletal: Normal range of motion.  Skin:    Coloration: Skin is not jaundiced or pale.     Findings: No bruising, erythema, lesion or rash.  Neurological:     Mental Status: She is alert and oriented to person, place, and time. Mental status is at baseline.  Psychiatric:  Mood and Affect: Mood normal.        Behavior: Behavior normal.        Thought Content: Thought content normal.        Judgment: Judgment normal.     Results for orders placed or performed in visit on 05/22/18  Ova and parasite examination  Result Value Ref Range   OVA + PARASITE EXAM Final report    O&P result 1 Comment   Fecal occult blood, imunochemical(Labcorp/Sunquest)  Result Value Ref Range   Fecal Occult Bld Positive (A) Negative  Stool C-Diff Toxin Assay  Result Value Ref Range   C difficile Toxins A+B, EIA Negative Negative  Fecal leukocytes  Result Value Ref Range   White Blood Cells (WBC), Stool Final report None Seen   Result 1 Comment   Stool Culture  Result Value Ref Range   Salmonella/Shigella Screen Final report    Stool Culture result 1  (RSASHR) Comment    Campylobacter Culture Final report    Stool Culture result 1 (CMPCXR) Comment    E coli, Shiga toxin Assay Negative Negative      Assessment & Plan:   Problem List Items Addressed This Visit      Other   Spinal stenosis of lumbar region - Primary    Tolerating lyrica well. Not noticing a big difference in her symptoms yet. Will continue to taper up to 150mg  TID and recheck 3-4 weeks. Call with any concerns.           Follow up plan: Return 3-4 weeks, for follow up lyrica.    . This visit was completed via FaceTime due to the restrictions of the COVID-19 pandemic. All issues as above were discussed and addressed. Physical exam was done as above through visual confirmation on FaceTime. If it was felt that the patient should be evaluated in the office, they were directed there. The patient verbally consented to this visit. . Location of the patient: home . Location of the provider: work . Those involved with this call:  . Provider: Park Liter, DO . CMA: Tiffany Reel, CMA . Front Desk/Registration: Don Perking  . Time spent on call: 15 minutes on the phone discussing health concerns. 23 minutes total spent in review of patient's record and preparation of their chart.

## 2018-07-03 ENCOUNTER — Ambulatory Visit: Payer: Medicare Other

## 2018-07-18 DIAGNOSIS — H903 Sensorineural hearing loss, bilateral: Secondary | ICD-10-CM | POA: Diagnosis not present

## 2018-07-18 DIAGNOSIS — R42 Dizziness and giddiness: Secondary | ICD-10-CM | POA: Diagnosis not present

## 2018-07-18 DIAGNOSIS — H9313 Tinnitus, bilateral: Secondary | ICD-10-CM | POA: Diagnosis not present

## 2018-07-19 ENCOUNTER — Other Ambulatory Visit: Payer: Self-pay | Admitting: Family Medicine

## 2018-07-19 NOTE — Telephone Encounter (Signed)
Requested Prescriptions  Pending Prescriptions Disp Refills  . TRULICITY 1.5 WU/8.8BV SOPN [Pharmacy Med Name: TRULICITY 1.5 QX/4.5WT SUBQ SOLN ML] 4 mL     Sig: INJECT 1.5MG  INTO THE SKIN ONCE A WEEK     Endocrinology:  Diabetes - GLP-1 Receptor Agonists Passed - 07/19/2018  9:30 AM      Passed - HBA1C is between 0 and 7.9 and within 180 days    Hemoglobin A1C  Date Value Ref Range Status  01/31/2016 8.7  Final   HB A1C (BAYER DCA - WAIVED)  Date Value Ref Range Status  05/21/2018 7.7 (H) <7.0 % Final    Comment:                                          Diabetic Adult            <7.0                                       Healthy Adult        4.3 - 5.7                                                           (DCCT/NGSP) American Diabetes Association's Summary of Glycemic Recommendations for Adults with Diabetes: Hemoglobin A1c <7.0%. More stringent glycemic goals (A1c <6.0%) may further reduce complications at the cost of increased risk of hypoglycemia.          Passed - Valid encounter within last 6 months    Recent Outpatient Visits          2 weeks ago Spinal stenosis of lumbar region, unspecified whether neurogenic claudication present   [place], Megan P, DO   1 month ago Spinal stenosis of lumbar region, unspecified whether neurogenic claudication present   [place], Megan P, DO   1 month ago Synovial cyst of left popliteal space   [facility] [place], Megan P, DO   1 month ago Diabetes mellitus type 2 with complications, uncontrolled ([place])   [place], [place], DO   2 months ago [place], Barbaraann Faster, NP      Future Appointments            In 1 week Johnson, Barb Merino, DO [facility] [facility], [place]   In 1 month Stoioff, Ronda Fairly, MD [facility]   In 1 month [place], Megan P, DO [employer], [place]   In 1 month Tah[place],  Lennette Bihari, MD [place]   In 3 months  [employer] [facility], [place]

## 2018-07-22 DIAGNOSIS — N183 Chronic kidney disease, stage 3 (moderate): Secondary | ICD-10-CM | POA: Diagnosis not present

## 2018-07-22 DIAGNOSIS — I129 Hypertensive chronic kidney disease with stage 1 through stage 4 chronic kidney disease, or unspecified chronic kidney disease: Secondary | ICD-10-CM | POA: Diagnosis not present

## 2018-07-22 DIAGNOSIS — R801 Persistent proteinuria, unspecified: Secondary | ICD-10-CM | POA: Diagnosis not present

## 2018-07-22 DIAGNOSIS — E1122 Type 2 diabetes mellitus with diabetic chronic kidney disease: Secondary | ICD-10-CM | POA: Diagnosis not present

## 2018-07-24 ENCOUNTER — Telehealth: Payer: Self-pay | Admitting: Family Medicine

## 2018-07-24 ENCOUNTER — Ambulatory Visit: Payer: Medicare Other

## 2018-07-24 NOTE — Telephone Encounter (Signed)
Copied from [place] [id number]. Topic: Quick Communication - Rx Refill/Question >> Jul 24, 2018  3:30 PM Alanda Slim E wrote: Medication: Pharmacy has Rx for gabapentin (NEURONTIN) 300 MG capsule and pregabalin (LYRICA) 75 MG capsule / they wanted to know if this is correct and if Pt should be taking both meds due to the similarities / please call and advise    Preferred Pharmacy (with phone number or street name): [place], [place] - [place] [phone] (Phone) [phone] (Fax)    Agent: Please be advised that RX refills may take up to 3 business days. We ask that you follow-up with your pharmacy.

## 2018-07-26 DIAGNOSIS — R42 Dizziness and giddiness: Secondary | ICD-10-CM | POA: Diagnosis not present

## 2018-07-26 NOTE — Telephone Encounter (Signed)
Tried to call pharmacy, no answer, will try again.

## 2018-07-26 NOTE — Telephone Encounter (Signed)
We are monitoring her closely and are aware that she is on both. This is correct.

## 2018-07-29 NOTE — Telephone Encounter (Signed)
Pharmacy notified.

## 2018-07-30 ENCOUNTER — Other Ambulatory Visit: Payer: Self-pay

## 2018-07-30 ENCOUNTER — Ambulatory Visit
Admission: RE | Admit: 2018-07-30 | Discharge: 2018-07-30 | Disposition: A | Discharge status: Home / Self Care | Payer: Medicare Other | Source: Ambulatory Visit | Attending: Gastroenterology | Admitting: Gastroenterology

## 2018-07-30 DIAGNOSIS — R110 Nausea: Secondary | ICD-10-CM | POA: Diagnosis not present

## 2018-07-30 DIAGNOSIS — K219 Gastro-esophageal reflux disease without esophagitis: Secondary | ICD-10-CM | POA: Diagnosis not present

## 2018-07-30 DIAGNOSIS — R1310 Dysphagia, unspecified: Secondary | ICD-10-CM | POA: Diagnosis not present

## 2018-07-31 ENCOUNTER — Ambulatory Visit (INDEPENDENT_AMBULATORY_CARE_PROVIDER_SITE_OTHER): Payer: Medicare Other | Admitting: Family Medicine

## 2018-07-31 ENCOUNTER — Encounter: Payer: Self-pay | Admitting: Family Medicine

## 2018-07-31 DIAGNOSIS — I209 Angina pectoris, unspecified: Secondary | ICD-10-CM | POA: Diagnosis not present

## 2018-07-31 DIAGNOSIS — M48061 Spinal stenosis, lumbar region without neurogenic claudication: Secondary | ICD-10-CM | POA: Diagnosis not present

## 2018-07-31 NOTE — Assessment & Plan Note (Signed)
She notes that she is feeling better with the lyrica, but has not totally tapered up just yet. Will continue to taper up and check back in in 1-2 months. Call with any concerns.

## 2018-07-31 NOTE — Progress Notes (Signed)
There were no vitals taken for this visit.   Subjective:    Patient ID: Tammy [name], female    DOB: 1946-03-06, 73 y.o.   MRN: [id number]  HPI: Tammy [name] is a 73 y.o. female  Chief Complaint  Patient presents with  . Back Pain    follow up on Lyrica  . Leg Pain   Celines presents today for follow up on her back pain with the addition of lyrica. She has severe spinal stenosis and is not a surgical candidate. She is already on gabapentin without significant benefit. She notes that the lyrica is helping. She continues with aching, shooting pain down both legs and behind her knee. She is better with sitting and worse with moving around. Pain is chronic in nature. She has not fully tapered up because of stress- her brother is in the ICU at [facility] and she was anxious about being sleepy during the day. She is otherwise feeling well with no other concerns or complaints at this time.   Relevant past medical, surgical, family and social history reviewed and updated as indicated. Interim medical history since our last visit reviewed. Allergies and medications reviewed and updated.  Review of Systems  Constitutional: Negative.   Respiratory: Negative.   Cardiovascular: Negative.   Musculoskeletal: Positive for back pain and myalgias. Negative for arthralgias, gait problem, joint swelling, neck pain and neck stiffness.  Skin: Negative.   Neurological: Negative.   Psychiatric/Behavioral: Negative.     Per HPI unless specifically indicated above     Objective:    There were no vitals taken for this visit.  Wt Readings from Last 3 Encounters:  07/02/18 209 lb (94.8 kg)  06/14/18 209 lb (94.8 kg)  05/29/18 210 lb 6.4 oz (95.4 kg)    Physical Exam Vitals signs and nursing note reviewed.  Constitutional:      General: She is not in acute distress.    Appearance: Normal appearance. She is not ill-appearing, toxic-appearing or diaphoretic.  HENT:     Head: Normocephalic and atraumatic.   Right Ear: External ear normal.     Left Ear: External ear normal.     Nose: Nose normal.     Mouth/Throat:     Mouth: Mucous membranes are moist.     Pharynx: Oropharynx is clear.  Eyes:     General: No scleral icterus.       Right eye: No discharge.        Left eye: No discharge.     Conjunctiva/sclera: Conjunctivae normal.     Pupils: Pupils are equal, round, and reactive to light.  Neck:     Musculoskeletal: Normal range of motion.  Pulmonary:     Effort: Pulmonary effort is normal. No respiratory distress.     Comments: Speaking in full sentences Musculoskeletal: Normal range of motion.  Skin:    Coloration: Skin is not jaundiced or pale.     Findings: No bruising, erythema, lesion or rash.  Neurological:     Mental Status: She is alert and oriented to person, place, and time. Mental status is at baseline.  Psychiatric:        Mood and Affect: Mood normal.        Behavior: Behavior normal.        Thought Content: Thought content normal.        Judgment: Judgment normal.     Results for orders placed or performed in visit on 05/22/18  Ova and parasite examination  Result Value Ref Range   OVA + PARASITE EXAM Final report    O&P result 1 Comment   Fecal occult blood, imunochemical([facility]corp/Sunquest)  Result Value Ref Range   Fecal Occult Bld Positive (A) Negative  Stool C-Diff Toxin Assay  Result Value Ref Range   C difficile Toxins A+B, EIA Negative Negative  Fecal leukocytes  Result Value Ref Range   White Blood Cells (WBC), Stool Final report None Seen   Result 1 Comment   Stool Culture  Result Value Ref Range   Salmonella/Shigella Screen Final report    Stool Culture result 1 (RSASHR) Comment    Campylobacter Culture Final report    Stool Culture result 1 (CMPCXR) Comment    E coli, Shiga toxin Assay Negative Negative      Assessment & Plan:   Problem List Items Addressed This Visit      Other   Spinal stenosis of lumbar region - Primary    She notes  that she is feeling better with the lyrica, but has not totally tapered up just yet. Will continue to taper up and check back in in 1-2 months. Call with any concerns.           Follow up plan: Return 4-8 weeks, for follow up back pain.   . This visit was completed via FaceTime due to the restrictions of the COVID-19 pandemic. All issues as above were discussed and addressed. Physical exam was done as above through visual confirmation on FaceTime. If it was felt that the patient should be evaluated in the office, they were directed there. The patient verbally consented to this visit. . Location of the patient: home . Location of the provider: home . Those involved with this call:  . Provider: Park Liter, DO . CMA: Tiffany Reel, CMA . Front Desk/Registration: Linard Millers  . Time spent on call: 15 minutes with patient face to face via video conference. More than 50% of this time was spent in counseling and coordination of care. 23 minutes total spent in review of patient's record and preparation of their chart.

## 2018-08-06 DIAGNOSIS — R42 Dizziness and giddiness: Secondary | ICD-10-CM | POA: Diagnosis not present

## 2018-08-07 ENCOUNTER — Ambulatory Visit
Admission: RE | Admit: 2018-08-07 | Discharge: 2018-08-07 | Disposition: A | Discharge status: Home / Self Care | Payer: Medicare Other | Source: Ambulatory Visit | Attending: Family Medicine | Admitting: Family Medicine

## 2018-08-07 ENCOUNTER — Other Ambulatory Visit: Payer: Self-pay

## 2018-08-07 DIAGNOSIS — N644 Mastodynia: Secondary | ICD-10-CM

## 2018-08-08 ENCOUNTER — Other Ambulatory Visit: Payer: Self-pay | Admitting: Family Medicine

## 2018-08-08 ENCOUNTER — Telehealth: Payer: Self-pay | Admitting: Family Medicine

## 2018-08-08 DIAGNOSIS — N6320 Unspecified lump in the left breast, unspecified quadrant: Secondary | ICD-10-CM

## 2018-08-08 DIAGNOSIS — R928 Other abnormal and inconclusive findings on diagnostic imaging of breast: Secondary | ICD-10-CM

## 2018-08-08 NOTE — Telephone Encounter (Signed)
Called and spoke to Newell about whether she would like to see surgery or have her biopsy with the cancer center. She would like to see Dr. Bary Castilla. Referral to him generated.

## 2018-08-12 DIAGNOSIS — C50919 Malignant neoplasm of unspecified site of unspecified female breast: Secondary | ICD-10-CM

## 2018-08-12 HISTORY — DX: Malignant neoplasm of unspecified site of unspecified female breast: C50.919

## 2018-08-13 ENCOUNTER — Ambulatory Visit (INDEPENDENT_AMBULATORY_CARE_PROVIDER_SITE_OTHER): Payer: PRIVATE HEALTH INSURANCE

## 2018-08-13 ENCOUNTER — Other Ambulatory Visit: Payer: Self-pay

## 2018-08-13 ENCOUNTER — Ambulatory Visit (INDEPENDENT_AMBULATORY_CARE_PROVIDER_SITE_OTHER): Payer: Medicare Other | Admitting: General Surgery

## 2018-08-13 ENCOUNTER — Encounter: Payer: Self-pay | Admitting: General Surgery

## 2018-08-13 ENCOUNTER — Telehealth: Payer: Self-pay

## 2018-08-13 VITALS — BP 117/76 | HR 80 | Temp 97.2°F | Resp 16 | Ht 66.0 in | Wt 214.0 lb

## 2018-08-13 DIAGNOSIS — C50312 Malignant neoplasm of lower-inner quadrant of left female breast: Secondary | ICD-10-CM | POA: Diagnosis not present

## 2018-08-13 DIAGNOSIS — I209 Angina pectoris, unspecified: Secondary | ICD-10-CM

## 2018-08-13 DIAGNOSIS — N6324 Unspecified lump in the left breast, lower inner quadrant: Secondary | ICD-10-CM

## 2018-08-13 DIAGNOSIS — N6323 Unspecified lump in the left breast, lower outer quadrant: Secondary | ICD-10-CM

## 2018-08-13 DIAGNOSIS — Z170 Estrogen receptor positive status [ER+]: Secondary | ICD-10-CM | POA: Diagnosis not present

## 2018-08-13 NOTE — Progress Notes (Signed)
Patient ID: Tammy [name], female   DOB: Jun 14, 1945, 73 y.o.   MRN: [id number]  Chief Complaint  Patient presents with  . Breast Problem    HPI Tammy [name] is a 73 y.o. female.  who presents for a breast evaluation. The most recent mammogram and ultrasound was done on 08-07-18.  Patient does not perform regular self breast checks and does not get regular mammograms done, prior mammogram was 2014.   She was unaware of any breast issues prior to her mammogram. She admits to left breast intermittent pain that started back in March, relieved by pressure and she then began to wear her bra at night.   HPI  Past Medical History:  Diagnosis Date  . Asthma   . Benign neoplasm of colon   . Cervical disc disease    "bulging" - no limitations per pt  . Chronic diastolic CHF (congestive heart failure) ([place])    a. 07/2012 Echo: Nl EF. mild inferoseptal HK, Gr 2 DD, mild conc LVH, mild PR/TR, mild PAH.  . CKD (chronic kidney disease), stage III ([place])   . COPD (chronic obstructive pulmonary disease) ([place])   . Coronary artery disease    a. 11/2011 Cath: LAD 63md, LCX min irregs, RCA 70p, 1059mith L->R collats, EF 60%-->Med Rx.  . Diabetes mellitus without complication ([facility] [place]  . Fusion of lumbar spine 12/22/2015  . GERD (gastroesophageal reflux disease)   . History of DVT (deep vein thrombosis)   . History of tobacco abuse    a. Quit 2011.  . Marland Kitchenyperlipidemia   . Hypertension   . Hypothyroidism   . Obesity   . Palpitations   . PE (pulmonary embolism) 10/15  . PONV (postoperative nausea and vomiting)   . Pulmonary [place]us ([place]   RLL. Presented with SOB and elevated d-dimer.   . Marland KitchenVD (peripheral vascular disease) ([facility]   a. PTA of right leg and left femoral artery with stenosis.  . Renal insufficiency   . Stroke ([facility]   TIAs - no deficits    Past Surgical History:  Procedure Laterality Date  . ANGIOPLASTY / STENTING FEMORAL     PVD; angioplasty right leg and left femoral  artery with stenosis.   . APPENDECTOMY  Oct. 2016  . BACK SURGERY  03/2012  . BACK SURGERY     screws, rods replaced with new hardware.  . Marland KitchenACK SURGERY  11/2016  . CARDIAC CATHETERIZATION  12/07/2011   Mid LAD 50%, distal LAD 50%, mid RCA 70%, distal RCA 100%   . CARDIAC CATHETERIZATION  01/04/2011   100% occluded mid RCA with good collaterals from distal LAD, normal LVEF.  . Marland KitchenHOLECYSTECTOMY    . COLONOSCOPY  2013  . COLONOSCOPY WITH PROPOFOL N/A 05/24/2015   Procedure: COLONOSCOPY WITH PROPOFOL;  Surgeon: DaLucilla LameMD;  [facility] Service[facility];  Laterality: N/A;  Diabetic - oral meds  . LAPAROSCOPIC APPENDECTOMY N/A 01/06/2015   Procedure: APPENDECTOMY LAPAROSCOPIC;  Surgeon: SeChristene LyeMD;  Location: [facility] ORS;  Service: General;  Laterality: N/A;  . POLYPECTOMY  05/24/2015   Procedure: POLYPECTOMY;  Surgeon: DaLucilla LameMD;  [place] [facility] Service: Endoscopy;;  . THYROIDECTOMY    . TRIGGER FINGER RELEASE      Family History  Problem Relation Age of Onset  . Hypertension Father   . Heart disease Father   . Heart attack Father   . Diabetes Father   . Cancer Mother  colon  . Heart attack Brother   . Diabetes Brother   . Hypertension Brother   . Heart disease Brother   . Heart attack Brother   . Diabetes Brother   . Hypertension Brother   . Heart disease Brother   . Heart attack Brother   . Diabetes Brother   . Hypertension Brother   . Heart disease Brother   . Cancer Sister        lung  . COPD Sister   . COPD Sister   . Breast cancer Neg Hx     Social History Social History   Tobacco Use  . Smoking status: Former Smoker    Packs/day: 0.50    Years: 15.00    Pack years: 7.50    Types: Cigarettes    Last attempt to quit: 03/13/2009    Years since quitting: 9.4  . Smokeless tobacco: Never Used  Substance Use Topics  . Alcohol use: No    Alcohol/week: 0.0 standard drinks  . Drug use: No     Allergies  Allergen Reactions  . Hydrocodone Other (See Comments)    Unresponsive  . Aleve [Naproxen Sodium]     Hives & itching   . Contrast Media [Iodinated Diagnostic Agents]     Pt avoids due to kidney issues  . Ioxaglate Other (See Comments)    (iodine) Pt avoids due to kidney issues   . Oxycodone Other (See Comments)    hallucinations  . Ranexa [Ranolazine]     Sick on stomach  . Sulfa Antibiotics Other (See Comments)    Pt avoids due to kidney issues  . Tramadol Other (See Comments)    nightmares    Current Outpatient Medications  Medication Sig Dispense Refill  . albuterol (PROAIR HFA) 108 (90 Base) MCG/ACT inhaler INHALE 2 PUFFS EVERY 4 HOURS AS NEEDED FOR WHEEZING OR SHORTNESS OF BREATH 8.5 g 3  . blood glucose meter kit and supplies KIT Dispense based on patient and insurance preference. Use one time daily as directed, E11.9 1 each 0  . DULoxetine (CYMBALTA) 60 MG capsule Take 1 capsule (60 mg total) by mouth daily. 90 capsule 1  . ELIQUIS 5 MG TABS tablet TAKE ONE TABLET BY MOUTH TWICE DAILY 180 tablet 3  . gabapentin (NEURONTIN) 300 MG capsule Take 1 capsule (300 mg total) by mouth 3 (three) times daily. 270 capsule 3  . glipiZIDE (GLUCOTROL) 5 MG tablet TAKE ONE TABLET EVERY DAY BEFORE BREAKFAST 90 tablet 1  . isosorbide mononitrate (IMDUR) 60 MG 24 hr tablet Take 1 tablet (60 mg total) by mouth as directed. Take 1 tablet at bedtime and 1/2 tablet in the morning 135 tablet 3  . levothyroxine (SYNTHROID) 150 MCG tablet Take 1 tablet (150 mcg total) by mouth daily before breakfast. 30 tablet 3  . losartan (COZAAR) 50 MG tablet Take 1 tablet (50 mg total) by mouth daily. 90 tablet 3  . metoprolol succinate (TOPROL-XL) 50 MG 24 hr tablet TAKE 1 TABLET BY MOUTH DAILY WITH OR IMMEDIATLEY FOLLOWING A MEAL 90 tablet 3  . montelukast (SINGULAIR) 10 MG tablet Take 1 tablet (10 mg total) by mouth at bedtime. 30 tablet 3  . nitroGLYCERIN (NITROSTAT) 0.4 MG SL tablet Place 1  tablet (0.4 mg total) under the tongue every 5 (five) minutes as needed for chest pain. 25 tablet 6  . ondansetron (ZOFRAN) 8 MG tablet Take 1 tablet (8 mg total) by mouth every 8 (eight) hours as needed for nausea or  vomiting. 30 tablet 1  . pantoprazole (PROTONIX) 40 MG tablet Take 1 tablet (40 mg total) by mouth daily as needed. 90 tablet 1  . pregabalin (LYRICA) 75 MG capsule 1 QHS x1 week, then increase to 1 BID for 1 week, if not too sleepy 2qHS and 1 during the day for 1 week, then 2 BID 120 capsule 3  . psyllium (METAMUCIL) 58.6 % packet Take 1 packet by mouth daily.    . rosuvastatin (CRESTOR) 40 MG tablet Take 1 tablet (40 mg total) by mouth daily. 90 tablet 3  . trimethoprim (TRIMPEX) 100 MG tablet Take 1 tablet (100 mg total) by mouth daily. 30 tablet 6  . TRULICITY 1.5 IE/3.3IR SOPN INJECT 1.5MG INTO THE SKIN ONCE A WEEK 4 mL 0   No current facility-administered medications for this visit.     Review of Systems Review of Systems  Constitutional: Negative.   Respiratory: Negative.   Cardiovascular: Negative.     Blood pressure 117/76, pulse 80, temperature (!) 97.2 F (36.2 C), temperature source Temporal, resp. rate 16, height _0  (1.676 m), weight 214 lb (97.1 kg), SpO2 94 %.  Physical Exam Physical Exam Exam conducted with a chaperone present.  Constitutional:      Appearance: She is well-developed.  Eyes:     General: No scleral icterus.    Conjunctiva/sclera: Conjunctivae normal.  Neck:     Musculoskeletal: Neck supple.  Cardiovascular:     Rate and Rhythm: Normal rate and regular rhythm.     Heart sounds: Normal heart sounds.  Pulmonary:     Effort: Pulmonary effort is normal.     Breath sounds: Normal breath sounds.  Chest:     Breasts:        Right: No inverted nipple, mass, nipple discharge, skin change or tenderness.        Left: Tenderness present. No inverted nipple, mass, nipple discharge or skin change.     Comments: Right breast > left breast.  Left breast lower inner quadrant tenderness. Lymphadenopathy:     Cervical: No cervical adenopathy.  Skin:    General: Skin is warm and dry.  Neurological:     Mental Status: She is alert and oriented to person, place, and time.  Psychiatric:        Behavior: Behavior normal.     Data Reviewed Mammograms of Aug 07, 2018 were independently reviewed and compared to the prior study of June 20, 2012.  New mass in the lower inner quadrant of the left breast.  BI-RADS-5.  Ultrasound examination of the left breast was completed in the lower inner quadrant.  In this location a taller than wide 0.74 x 0.92 x 1.10 cm mass with a hyperechoic rim was noted in the 8 o'clock position 5 cm from the nipple.  After reviewing the biopsy procedure the patient was amenable to proceed.  A total of 10 cc of 0.5% Xylocaine with 0.25% Marcaine with 1-200,000's of epinephrine was utilized and well-tolerated.  ChloraPrep was applied to the skin.  The skin was incised medial to the lesion and a 14-gauge Bard biopsy device was used to obtain 4 samples.  No bleeding was noted in spite of the patient's treatment with Eliquis.  A postbiopsy clip was placed.  The skin defect was closed with benzoin and Steri-Strip followed by Telfa and Tegaderm dressing.  Ice pack applied.  Procedure well-tolerated.  The clinical record was reviewed regarding her ongoing anticoagulation.  In October 2015 she presented with  shortness of breath.  Cardiac evaluation was negative, but with an elevated d-dimer she underwent CT imaging with identification of a right lower lobe pulmonary embolus.  Subsequent lower extremity ultrasound failed to show evidence of DVT.  She has been managed on Eliquis since that date.  A message has been left with her cardiologist regarding ongoing Eliquis therapy.  Laboratory studies dated May 21, 2018 showed an elevated white blood cell count of 11,200 with normal differential.  Hemoglobin 14.2.  Platelet count  285,000.  Comprehensive metabolic panel the same date showed an elevated potassium at 6.0.  Creatinine of 1.59 with an EGFR of 32, 1.365 months ago, 2.369 months ago.  Normal liver function studies.  Assessment Carcinoma of the left breast pending pathology confirmation.  Plan  The patient will be contacted when pathology is available an appointment made to review options for management.  HPI, assessment, plan and physical exam has been scribed under the direction and in the presence of [name], MD. Karie Fetch, RN  I have completed the exam and reviewed the above documentation for accuracy and completeness.  I agree with the above.  Haematologist has been used and any errors in dictation or transcription are unintentional.  Hervey Ard, M.D., F.A.C.S.  [place] Gle[place] Daeron Carreno 08/14/2018, 2:42 PM

## 2018-08-13 NOTE — Telephone Encounter (Signed)
Virtual Visit Pre-Appointment Phone Call  "Tammy [name], I am calling you today to discuss your upcoming appointment. We are currently trying to limit exposure to the virus that causes COVID-19 by seeing patients at home rather than in the office."  1. "What is the BEST phone number to call the day of the visit?" - include this in appointment notes  2. "Do you have or have access to (through a family member/friend) a smartphone with video capability that we can use for your visit?" a. If yes - list this number in appt notes as "cell" (if different from BEST phone #) and list the appointment type as a VIDEO visit in appointment notes b. If no - list the appointment type as a PHONE visit in appointment notes  3. Confirm consent - "In the setting of the current Covid19 crisis, you are scheduled for a video visit with your provider on 08/19/2018 at 10:00AM.  Just as we do with many in-office visits, in order for you to participate in this visit, we must obtain consent.  If you'd like, I can send this to your mychart (if signed up) or email for you to review.  Otherwise, I can obtain your verbal consent now.  All virtual visits are billed to your insurance company just like a normal visit would be.  By agreeing to a virtual visit, we'd like you to understand that the technology does not allow for your provider to perform an examination, and thus may limit your provider's ability to fully assess your condition. If your provider identifies any concerns that need to be evaluated in person, we will make arrangements to do so.  Finally, though the technology is pretty good, we cannot assure that it will always work on either your or our end, and in the setting of a video visit, we may have to convert it to a phone-only visit.  In either situation, we cannot ensure that we have a secure connection.  Are you willing to proceed?" STAFF: Did the patient verbally acknowledge consent to telehealth visit? Document YES/NO here:  YES  4. Advise patient to be prepared - "Two hours prior to your appointment, go ahead and check your blood pressure, pulse, oxygen saturation, and your weight (if you have the equipment to check those) and write them all down. When your visit starts, your provider will ask you for this information. If you have an Apple Watch or Kardia device, please plan to have heart rate information ready on the day of your appointment. Please have a pen and paper handy nearby the day of the visit as well."  5. Give patient instructions for MyChart download to smartphone OR Doximity/Doxy.me as below if video visit (depending on what platform provider is using)  6. Inform patient they will receive a phone call 15 minutes prior to their appointment time (may be from unknown caller ID) so they should be prepared to answer    TELEPHONE CALL NOTE  Tammy [name] has been deemed a candidate for a follow-up tele-health visit to limit community exposure during the Covid-19 pandemic. I spoke with the patient via phone to ensure availability of phone/video source, confirm preferred email & phone number, and discuss instructions and expectations.  I reminded Tammy [name] to be prepared with any vital sign and/or heart rhythm information that could potentially be obtained via home monitoring, at the time of her visit. I reminded Tammy [name] to expect a phone call prior to her visit.  Lahoma Rocker Newcomer McClain 08/13/2018 1:50 PM   INSTRUCTIONS FOR DOWNLOADING THE MYCHART APP TO SMARTPHONE  - The patient must first make sure to have activated MyChart and know their login information - If Apple, go to [employer] and type in MyChart in the search bar and download the app. If Android, ask patient to go to [employer] and type in [place] in the search bar and download the app. The app is free but as with any other app downloads, their phone may require them to verify saved payment information or Apple/Android password.  - The  patient will need to then log into the app with their MyChart username and password, and select [facility] as their healthcare provider to link the account. When it is time for your visit, go to the MyChart app, find appointments, and click Begin Video Visit. Be sure to Select Allow for your device to access the Microphone and Camera for your visit. You will then be connected, and your provider will be with you shortly.  **If they have any issues connecting, or need assistance please contact MyChart service desk [phone])**  **If using a computer, in order to ensure the best quality for their visit they will need to use either of the following Internet Browsers: [employer], or Google Chrome**  IF USING DOXIMITY or DOXY.ME - The patient will receive a link just prior to their visit by text.     FULL LENGTH CONSENT FOR TELE-HEALTH VISIT   I hereby voluntarily request, consent and authorize [facility] and its employed or contracted physicians, physician assistants, nurse practitioners or other licensed health care professionals (the Practitioner), to provide me with telemedicine health care services (the "Services") as deemed necessary by the treating Practitioner. I acknowledge and consent to receive the Services by the Practitioner via telemedicine. I understand that the telemedicine visit will involve communicating with the Practitioner through live audiovisual communication technology and the disclosure of certain medical information by electronic transmission. I acknowledge that I have been given the opportunity to request an in-person assessment or other available alternative prior to the telemedicine visit and am voluntarily participating in the telemedicine visit.  I understand that I have the right to withhold or withdraw my consent to the use of telemedicine in the course of my care at any time, without affecting my right to future care or treatment, and that the  Practitioner or I may terminate the telemedicine visit at any time. I understand that I have the right to inspect all information obtained and/or recorded in the course of the telemedicine visit and may receive copies of available information for a reasonable fee.  I understand that some of the potential risks of receiving the Services via telemedicine include:  [name] Delay or interruption in medical evaluation due to technological equipment failure or disruption; . Information transmitted may not be sufficient (e.g. poor resolution of images) to allow for appropriate medical decision making by the Practitioner; and/or  . In rare instances, security protocols could fail, causing a breach of personal health information.  Furthermore, I acknowledge that it is my responsibility to provide information about my medical history, conditions and care that is complete and accurate to the best of my ability. I acknowledge that Practitioner's advice, recommendations, and/or decision may be based on factors not within their control, such as incomplete or inaccurate data provided by me or distortions of diagnostic images or specimens that may result from electronic transmissions. I understand that  the practice of medicine is not an exact science and that Practitioner makes no warranties or guarantees regarding treatment outcomes. I acknowledge that I will receive a copy of this consent concurrently upon execution via email to the email address I last provided but may also request a printed copy by calling the office of [facility].    I understand that my insurance will be billed for this visit.   I have read or had this consent read to me. . I understand the contents of this consent, which adequately explains the benefits and risks of the Services being provided via telemedicine.  . I have been provided ample opportunity to ask questions regarding this consent and the Services and have had my questions answered to my  satisfaction. . I give my informed consent for the services to be provided through the use of telemedicine in my medical care  By participating in this telemedicine visit I agree to the above.

## 2018-08-13 NOTE — Patient Instructions (Signed)
The patient is aware to call back for any questions or new concerns.    CARE AFTER BREAST BIOPSY  1. Leave the dressing on that your doctor applied after the biopsy. It is waterproof. You may bathe, shower and/or swim. The dressing can be removed in 3 days, you will see small strips of tape against your skin on the incision. Do not remove these strips they will gradually fall off in about 2-3 weeks. You may use an ice pack on and off for the first 12-24 hours for comfort.  2. You may want to use a gauze,cloth or similar protection in your bra to prevent rubbing against your dressing and incision. This is not necessary, but you may feel more comfortable doing so.  3. It is recommended that you wear a bra day and night to give support to the breast. This will prevent the weight of the breast from pulling on the incision.  4. Your breast may feel hard and lumpy under the incision. Do not be alarmed. This is the underlying stitching of tissue. Softening of this tissue will occur in time.  5. You may have a follow up appointment or phone follow up in one week after your biopsy. The office phone number is [phone].  6. You will notice about a week or two after your office visit that the strips of the tape on your incision will begin to loosen. These may then be removed.  7. Report to your doctor any of the following:  * Severe pain not relieved by your pain medication  *Redness of the incision  * Drainage from the incision  *Fever greater than 101 degrees  

## 2018-08-14 ENCOUNTER — Encounter: Payer: Self-pay | Admitting: General Surgery

## 2018-08-14 ENCOUNTER — Telehealth: Payer: Self-pay | Admitting: General Surgery

## 2018-08-14 DIAGNOSIS — C50312 Malignant neoplasm of lower-inner quadrant of left female breast: Secondary | ICD-10-CM | POA: Insufficient documentation

## 2018-08-14 NOTE — Telephone Encounter (Signed)
Thank you for your care of Tammy [name]. I know she was very happy to see you. Thank you for letting me know.

## 2018-08-14 NOTE — Telephone Encounter (Signed)
The patient was notified of the results from yesterday's breast biopsy showing invasive mammary cancer.  Arrangements are in place for an office visit tomorrow morning to review options for management.  She reports minimal discomfort from the procedure.

## 2018-08-15 ENCOUNTER — Encounter: Payer: Self-pay | Admitting: General Surgery

## 2018-08-15 ENCOUNTER — Other Ambulatory Visit: Payer: Self-pay

## 2018-08-15 ENCOUNTER — Ambulatory Visit: Payer: Medicare Other | Admitting: Physical Therapy

## 2018-08-15 ENCOUNTER — Ambulatory Visit (INDEPENDENT_AMBULATORY_CARE_PROVIDER_SITE_OTHER): Payer: Medicare Other | Admitting: General Surgery

## 2018-08-15 VITALS — BP 137/81 | HR 86 | Temp 97.7°F | Ht 66.0 in | Wt 215.0 lb

## 2018-08-15 DIAGNOSIS — C50312 Malignant neoplasm of lower-inner quadrant of left female breast: Secondary | ICD-10-CM | POA: Diagnosis not present

## 2018-08-15 DIAGNOSIS — I209 Angina pectoris, unspecified: Secondary | ICD-10-CM | POA: Diagnosis not present

## 2018-08-15 NOTE — Progress Notes (Signed)
Patient ID: Tammy [name], female   DOB: 10-04-1945, 72 y.o.   MRN: [id number]  Chief Complaint  Patient presents with  . Follow-up    discussion    HPI Tammy [name] is a 73 y.o. female.  Here for follow up breast discussion. She is here with her husband, Tammy [name]. The patient had been previously notified of the biopsy results. HPI  Past Medical History:  Diagnosis Date  . Asthma   . Benign neoplasm of colon   . Cervical disc disease    "bulging" - no limitations per pt  . Chronic diastolic CHF (congestive heart failure) ([place])    a. 07/2012 Echo: Nl EF. mild inferoseptal HK, Gr 2 DD, mild conc LVH, mild PR/TR, mild PAH.  . CKD (chronic kidney disease), stage III ([place])   . COPD (chronic obstructive pulmonary disease) ([place])   . Coronary artery disease    a. 11/2011 Cath: LAD 41md, LCX min irregs, RCA 70p, 1051mith L->R collats, EF 60%-->Med Rx.  . Diabetes mellitus without complication ([facility]  . Fusion of lumbar spine 12/22/2015  . GERD (gastroesophageal reflux disease)   . History of DVT (deep vein thrombosis)   . History of tobacco abuse    a. Quit 2011.  . Marland Kitchenyperlipidemia   . Hypertension   . Hypothyroidism   . Obesity   . Palpitations   . PE (pulmonary embolism) 10/15  . PONV (postoperative nausea and vomiting)   . [facility] [place] [place]   RLL. Presented with SOB and elevated d-dimer.   . Marland KitchenVD (peripheral vascular disease) ([facility]   a. PTA of right leg and left femoral artery with stenosis.  . Renal insufficiency   . [facility]   TIAs - no deficits    Past Surgical History:  Procedure Laterality Date  . ANGIOPLASTY / STENTING FEMORAL     PVD; angioplasty right leg and left femoral artery with stenosis.   . APPENDECTOMY  Oct. 2016  . BACK SURGERY  03/2012  . BACK SURGERY     screws, rods replaced with new hardware.  . Marland KitchenACK SURGERY  11/2016  . CARDIAC CATHETERIZATION  12/07/2011   Mid LAD 50%, distal LAD 50%, mid RCA 70%, distal RCA 100%   . CARDIAC  CATHETERIZATION  01/04/2011   100% occluded mid RCA with good collaterals from distal LAD, normal LVEF.  . Marland KitchenHOLECYSTECTOMY    . COLONOSCOPY  2013  . COLONOSCOPY WITH PROPOFOL N/A 05/24/2015   Procedure: COLONOSCOPY WITH PROPOFOL;  Surgeon: DaLucilla LameMD;  [facility] Service: Endoscopy;  Laterality: N/A;  Diabetic - oral meds  . LAPAROSCOPIC APPENDECTOMY N/A 01/06/2015   Procedure: APPENDECTOMY LAPAROSCOPIC;  Surgeon: SeChristene LyeMD;  Location: [facility] ORS;  Service: General;  Laterality: N/A;  . POLYPECTOMY  05/24/2015   Procedure: POLYPECTOMY;  Surgeon: DaLucilla LameMD;  [facility] Service: End[facility];;  . THYROIDECTOMY    . TRIGGER FINGER RELEASE      Family History  Problem Relation Age of Onset  . Hypertension Father   . Heart disease Father   . Heart attack Father   . Diabetes Father   . Cancer Mother        colon  . Heart attack Brother   . Diabetes Brother   . Hypertension Brother   . Heart disease Brother   . Heart attack Brother   . Diabetes Brother   . Hypertension Brother   . Heart disease  Brother   . Heart attack Brother   . Diabetes Brother   . Hypertension Brother   . Heart disease Brother   . Cancer Sister        lung  . COPD Sister   . COPD Sister   . Breast cancer Neg Hx     Social History Social History   Tobacco Use  . Smoking status: Former Smoker    Packs/day: 0.50    Years: 15.00    Pack years: 7.50    Types: Cigarettes    Last attempt to quit: 03/13/2009    Years since quitting: 9.4  . Smokeless tobacco: Never Used  Substance Use Topics  . Alcohol use: No    Alcohol/week: 0.0 standard drinks  . Drug use: No    Allergies  Allergen Reactions  . Hydrocodone Other (See Comments)    Unresponsive  . Aleve [Naproxen Sodium]     Hives & itching   . Contrast Media [Iodinated Diagnostic Agents]     Pt avoids due to kidney issues  . Ioxaglate Other (See Comments)    (iodine) Pt avoids due  to kidney issues   . Oxycodone Other (See Comments)    hallucinations  . Ranexa [Ranolazine]     Sick on stomach  . Sulfa Antibiotics Other (See Comments)    Pt avoids due to kidney issues  . Tramadol Other (See Comments)    nightmares    Current Outpatient Medications  Medication Sig Dispense Refill  . albuterol (PROAIR HFA) 108 (90 Base) MCG/ACT inhaler INHALE 2 PUFFS EVERY 4 HOURS AS NEEDED FOR WHEEZING OR SHORTNESS OF BREATH 8.5 g 3  . blood glucose meter kit and supplies KIT Dispense based on patient and insurance preference. Use one time daily as directed, E11.9 1 each 0  . DULoxetine (CYMBALTA) 60 MG capsule Take 1 capsule (60 mg total) by mouth daily. 90 capsule 1  . ELIQUIS 5 MG TABS tablet TAKE ONE TABLET BY MOUTH TWICE DAILY 180 tablet 3  . gabapentin (NEURONTIN) 300 MG capsule Take 1 capsule (300 mg total) by mouth 3 (three) times daily. 270 capsule 3  . glipiZIDE (GLUCOTROL) 5 MG tablet TAKE ONE TABLET EVERY DAY BEFORE BREAKFAST 90 tablet 1  . isosorbide mononitrate (IMDUR) 60 MG 24 hr tablet Take 1 tablet (60 mg total) by mouth as directed. Take 1 tablet at bedtime and 1/2 tablet in the morning 135 tablet 3  . levothyroxine (SYNTHROID) 150 MCG tablet Take 1 tablet (150 mcg total) by mouth daily before breakfast. 30 tablet 3  . losartan (COZAAR) 50 MG tablet Take 1 tablet (50 mg total) by mouth daily. 90 tablet 3  . metoprolol succinate (TOPROL-XL) 50 MG 24 hr tablet TAKE 1 TABLET BY MOUTH DAILY WITH OR IMMEDIATLEY FOLLOWING A MEAL 90 tablet 3  . montelukast (SINGULAIR) 10 MG tablet Take 1 tablet (10 mg total) by mouth at bedtime. 30 tablet 3  . nitroGLYCERIN (NITROSTAT) 0.4 MG SL tablet Place 1 tablet (0.4 mg total) under the tongue every 5 (five) minutes as needed for chest pain. 25 tablet 6  . ondansetron (ZOFRAN) 8 MG tablet Take 1 tablet (8 mg total) by mouth every 8 (eight) hours as needed for nausea or vomiting. 30 tablet 1  . pantoprazole (PROTONIX) 40 MG tablet Take 1  tablet (40 mg total) by mouth daily as needed. 90 tablet 1  . pregabalin (LYRICA) 75 MG capsule 1 QHS x1 week, then increase to 1 BID for 1  week, if not too sleepy 2qHS and 1 during the day for 1 week, then 2 BID 120 capsule 3  . psyllium (METAMUCIL) 58.6 % packet Take 1 packet by mouth daily.    . rosuvastatin (CRESTOR) 40 MG tablet Take 1 tablet (40 mg total) by mouth daily. 90 tablet 3  . trimethoprim (TRIMPEX) 100 MG tablet Take 1 tablet (100 mg total) by mouth daily. 30 tablet 6  . TRULICITY 1.5 WU/1.3KG SOPN INJECT 1.5MG INTO THE SKIN ONCE A WEEK 4 mL 0   No current facility-administered medications for this visit.     Review of Systems Review of Systems  Constitutional: Negative.   Respiratory: Negative.   Cardiovascular: Negative.     Blood pressure 137/81, pulse 86, temperature 97.7 F (36.5 C), temperature source Temporal, height 5' 6"  (1.676 m), weight 215 lb (97.5 kg), SpO2 92 %.  Physical Exam Physical Exam Skin:    General: Skin is warm.  Neurological:     Mental Status: She is alert and oriented to person, place, and time.  Psychiatric:        Behavior: Behavior normal.   Wound evaluation completed by the nurse reported minimal bruising.  Data Reviewed Phone conversation with Tammy Rogue, MD, PhD from cardiology undertaken.  Acceptable for the patient to discontinue Eliquis 2 days prior to planned surgical intervention.  He will review with her at the time of her upcoming follow-up scheduled for August 19, 2018.  Biopsy of August 13, 2018: Breast, left, needle core biopsy, mass - INVASIVE DUCTAL CARCINOMA, GRADE 2. IMMUNOHISTOCHEMICAL AND MORPHOMETRIC ANALYSIS PERFORMED MANUALLY The tumor cells are NEGATIVE for Her2 (1+). Estrogen Receptor: 100%, POSITIVE, STRONG STAINING INTENSITY Progesterone Receptor: 100%, POSITIVE, STRONG STAINING INTENSITY Proliferation Marker Ki67: 20% REFERENCE RANGE ESTROGEN RECEPTOR NEGATIVE 0% POSITIVE =>1% REFERENCE RANGE  PROGESTERONE RECEPTOR NEGATIVE 0% POSITIVE =>1%  Assessment Clinical T1b, N0 carcinoma of the left breast.  Plan The entire visit was devoted to reviewing management options for her early stage breast cancer. Breast conservation with lumpectomy and radiation therapy  was presented as equivalent to mastectomy for long-term control. The pros and cons of each treatment regimen were reviewed. The indications for additional therapy such as chemotherapy were touched on briefly, realizing that the majority of information required to determine if chemotherapy would be of benefit is not available at this time. The availability of consultation services for medical oncology and radiation oncology prior to surgery were reviewed.  Informational brochure and appropriate websites provided.  The opportunity for second surgical opinion was reviewed.  Will discuss with her cardiologist (completed after her visit) ability to withhold Eliquis.  The patient will review her options and notify the office of how she would like to proceed.  HPI, assessment, plan and physical exam has been scribed under the direction and in the presence of Tammy Bellow, MD. Tammy Fetch, RN  I have completed the exam and reviewed the above documentation for accuracy and completeness.  I agree with the above.  Haematologist has been used and any errors in dictation or transcription are unintentional.  Hervey Ard, M.D., F.A.C.S.  Tammy [name] 08/16/2018, 12:02 PM

## 2018-08-17 NOTE — Progress Notes (Signed)
Virtual Visit via Video Note   This visit type was conducted due to national recommendations for restrictions regarding the COVID-19 Pandemic (e.g. social distancing) in an effort to limit this patient's exposure and mitigate transmission in our community.  Due to her co-morbid illnesses, this patient is at least at moderate risk for complications without adequate follow up.  This format is felt to be most appropriate for this patient at this time.  All issues noted in this document were discussed and addressed.  A limited physical exam was performed with this format.  Please refer to the patient's chart for her consent to telehealth for [facility].   I connected with  Tammy [name] on 08/19/18 by a video enabled telemedicine application and verified that I am speaking with the correct person using two identifiers. I discussed the limitations of evaluation and management by telemedicine. The patient expressed understanding and agreed to proceed.   Evaluation Performed:  Follow-up visit  Date:  08/19/2018   ID:  Tammy [name], DOB 1945-11-29, MRN [id number]  Patient Location:  [place] [address]   Provider location:   [facility], [place] office  PCP:  Tammy Roys, DO  Cardiologist:  Tammy [name]   Chief Complaint:  Breast pain    History of Present Illness:    Tammy [name] is a 73 y.o. female who presents via audio/video conferencing for a telehealth visit today.   The patient does not symptoms concerning for COVID-19 infection (fever, chills, cough, or new SHORTNESS OF BREATH).   Patient has a past medical history of coronary artery disease, occluded mid RCA, in 2013  Mid LAD with moderate disease PAD with angioplasty of the legs bilaterally,  COPD,  chronic kidney disease diabetes type 2, uncontrolled diagnosis of PE in October 2015, started on anticoagulation, eliquis hypertension,  back surgery January 2014 with rods placed at  that time,  remote smoking history stopped 5 years ago,  obesity,  deconditioned secondary to chronic back pain  who presents for routine followup of her coronary artery disease   Having surgery with Tammy [name] Dx of breast cancer Chest pain over the breast from the lump At rest  No significant angina, SOB  Chronic back pain, stimulator in place On Cymbalta  HBA1C 7.7  Several recent back surgeries most recently March 2018  back surgery October 2017  Other past medical history Previously in the emergency room, treated for bronchial asthma. Takes her albuterol with some improvement of her symptoms. Shortness of breath symptoms have been dating back for many years.   may have developed a DVT from sitting long periods of time.    prior heart catheterizations in 2012 and 2013. Catheterization in 2012 documented occluded mid RCA with left to right collaterals, 40% mid LAD disease, 40% mid circumflex disease, 30% proximal LAD disease Catheterization in September 2013 showed normal LV function, occluded mid RCA with left to right collaterals, 50% mid and distal LAD disease  Stress test September 2014 showing very small anterior wall defect likely secondary to breast attenuation artifact Echocardiogram May 2014 showing low normal ejection fraction, mild TR, mild pulmonary hypertension   Prior CV studies:   The following studies were reviewed today:  Stress test 03/28/2018 Low risk  CT chest 02/2018   Past Medical History:  Diagnosis Date  . Asthma   . Benign neoplasm of colon   . Cervical disc disease    "bulging" - no  limitations per pt  . Chronic diastolic CHF (congestive heart failure) ([place])    a. 07/2012 Echo: Nl EF. mild inferoseptal HK, Gr 2 DD, mild conc LVH, mild PR/TR, mild PAH.  . CKD (chronic kidney disease), stage III ([place])   . COPD (chronic obstructive pulmonary disease) ([place])   . Coronary artery disease    a. 11/2011 Cath: LAD 69md, LCX min irregs, RCA  70p, 1047mith L->R collats, EF 60%-->Med Rx.  . Diabetes mellitus without complication ([facility]  . Fusion of lumbar spine 12/22/2015  . GERD (gastroesophageal reflux disease)   . History of DVT (deep vein thrombosis)   . History of tobacco abuse    a. Quit 2011.  . Marland Kitchenyperlipidemia   . Hypertension   . Hypothyroidism   . Obesity   . Palpitations   . PE (pulmonary embolism) 10/15  . PONV (postoperative nausea and vomiting)   . Pulmonary embolus ([facility]10/15/2015   RLL. Presented with SOB and elevated d-dimer.   . Marland KitchenVD (peripheral vascular disease) ([facility]   a. PTA of right leg and left femoral artery with stenosis.  . Renal insufficiency   . [facility]   TIAs - no deficits   Past Surgical History:  Procedure Laterality Date  . ANGIOPLASTY / STENTING FEMORAL     PVD; angioplasty right leg and left femoral artery with stenosis.   . APPENDECTOMY  Oct. 2016  . BACK SURGERY  03/2012  . BACK SURGERY     screws, rods replaced with new hardware.  . Marland KitchenACK SURGERY  11/2016  . CARDIAC CATHETERIZATION  12/07/2011   Mid LAD 50%, distal LAD 50%, mid RCA 70%, distal RCA 100%   . CARDIAC CATHETERIZATION  01/04/2011   100% occluded mid RCA with good collaterals from distal LAD, normal LVEF.  . Marland KitchenHOLECYSTECTOMY    . COLONOSCOPY  2013  . COLONOSCOPY WITH PROPOFOL N/A 05/24/2015   Procedure: COLONOSCOPY WITH PROPOFOL;  Surgeon: Tammy LameMD;  [facility] Service: Endoscopy;  Laterality: N/A;  Diabetic - oral meds  . LAPAROSCOPIC APPENDECTOMY N/A 01/06/2015   Procedure: APPENDECTOMY LAPAROSCOPIC;  Surgeon: Tammy LyeMD;  Location: [facility] ORS;  Service: General;  Laterality: N/A;  . POLYPECTOMY  05/24/2015   Procedure: POLYPECTOMY;  Surgeon: Tammy LameMD;  [facility]: [facility] Service: Endoscopy;;  . THYROIDECTOMY    . TRIGGER FINGER RELEASE       Current Meds  Medication Sig  . albuterol (PROAIR HFA) 108 (90 Base) MCG/ACT inhaler INHALE 2 PUFFS  EVERY 4 HOURS AS NEEDED FOR WHEEZING OR SHORTNESS OF BREATH  . blood glucose meter kit and supplies KIT Dispense based on patient and insurance preference. Use one time daily as directed, E11.9  . DULoxetine (CYMBALTA) 60 MG capsule Take 1 capsule (60 mg total) by mouth daily.  . Marland KitchenLIQUIS 5 MG TABS tablet TAKE ONE TABLET BY MOUTH TWICE DAILY  . gabapentin (NEURONTIN) 300 MG capsule Take 1 capsule (300 mg total) by mouth 3 (three) times daily.  . Marland KitchenlipiZIDE (GLUCOTROL) 5 MG tablet TAKE ONE TABLET EVERY DAY BEFORE BREAKFAST  . isosorbide mononitrate (IMDUR) 60 MG 24 hr tablet Take 1 tablet (60 mg total) by mouth as directed. Take 1 tablet at bedtime and 1/2 tablet in the morning  . levothyroxine (SYNTHROID) 150 MCG tablet Take 1 tablet (150 mcg total) by mouth daily before breakfast.  . losartan (COZAAR) 25 MG tablet Take 25 mg by mouth daily.  .[name]  metoprolol succinate (TOPROL-XL) 50 MG 24 hr tablet TAKE 1 TABLET BY MOUTH DAILY WITH OR IMMEDIATLEY FOLLOWING A MEAL  . montelukast (SINGULAIR) 10 MG tablet Take 1 tablet (10 mg total) by mouth at bedtime.  . nitroGLYCERIN (NITROSTAT) 0.4 MG SL tablet Place 1 tablet (0.4 mg total) under the tongue every 5 (five) minutes as needed for chest pain.  . pantoprazole (PROTONIX) 40 MG tablet Take 1 tablet (40 mg total) by mouth daily as needed.  . pregabalin (LYRICA) 75 MG capsule 1 QHS x1 week, then increase to 1 BID for 1 week, if not too sleepy 2qHS and 1 during the day for 1 week, then 2 BID  . psyllium (METAMUCIL) 58.6 % packet Take 1 packet by mouth daily.  . rosuvastatin (CRESTOR) 40 MG tablet Take 1 tablet (40 mg total) by mouth daily.  [name] trimethoprim (TRIMPEX) 100 MG tablet Take 1 tablet (100 mg total) by mouth daily.  . TRULICITY 1.5 TK/1.6WF SOPN INJECT 1.5MG INTO THE SKIN ONCE A WEEK     Allergies:   Hydrocodone; Aleve [naproxen sodium]; Contrast media [iodinated diagnostic agents]; Ioxaglate; Oxycodone; Ranexa [ranolazine]; Sulfa antibiotics; and  Tramadol   Social History   Tobacco Use  . Smoking status: Former Smoker    Packs/day: 0.50    Years: 15.00    Pack years: 7.50    Types: Cigarettes    Last attempt to quit: 03/13/2009    Years since quitting: 9.4  . Smokeless tobacco: Never Used  Substance Use Topics  . Alcohol use: No    Alcohol/week: 0.0 standard drinks  . Drug use: No     Current Outpatient Medications on File Prior to Visit  Medication Sig Dispense Refill  . albuterol (PROAIR HFA) 108 (90 Base) MCG/ACT inhaler INHALE 2 PUFFS EVERY 4 HOURS AS NEEDED FOR WHEEZING OR SHORTNESS OF BREATH 8.5 g 3  . blood glucose meter kit and supplies KIT Dispense based on patient and insurance preference. Use one time daily as directed, E11.9 1 each 0  . DULoxetine (CYMBALTA) 60 MG capsule Take 1 capsule (60 mg total) by mouth daily. 90 capsule 1  . ELIQUIS 5 MG TABS tablet TAKE ONE TABLET BY MOUTH TWICE DAILY 180 tablet 3  . gabapentin (NEURONTIN) 300 MG capsule Take 1 capsule (300 mg total) by mouth 3 (three) times daily. 270 capsule 3  . glipiZIDE (GLUCOTROL) 5 MG tablet TAKE ONE TABLET EVERY DAY BEFORE BREAKFAST 90 tablet 1  . isosorbide mononitrate (IMDUR) 60 MG 24 hr tablet Take 1 tablet (60 mg total) by mouth as directed. Take 1 tablet at bedtime and 1/2 tablet in the morning 135 tablet 3  . levothyroxine (SYNTHROID) 150 MCG tablet Take 1 tablet (150 mcg total) by mouth daily before breakfast. 30 tablet 3  . losartan (COZAAR) 25 MG tablet Take 25 mg by mouth daily.    . metoprolol succinate (TOPROL-XL) 50 MG 24 hr tablet TAKE 1 TABLET BY MOUTH DAILY WITH OR IMMEDIATLEY FOLLOWING A MEAL 90 tablet 3  . montelukast (SINGULAIR) 10 MG tablet Take 1 tablet (10 mg total) by mouth at bedtime. 30 tablet 3  . nitroGLYCERIN (NITROSTAT) 0.4 MG SL tablet Place 1 tablet (0.4 mg total) under the tongue every 5 (five) minutes as needed for chest pain. 25 tablet 6  . pantoprazole (PROTONIX) 40 MG tablet Take 1 tablet (40 mg total) by mouth  daily as needed. 90 tablet 1  . pregabalin (LYRICA) 75 MG capsule 1 QHS x1 week, then  increase to 1 BID for 1 week, if not too sleepy 2qHS and 1 during the day for 1 week, then 2 BID 120 capsule 3  . psyllium (METAMUCIL) 58.6 % packet Take 1 packet by mouth daily.    . rosuvastatin (CRESTOR) 40 MG tablet Take 1 tablet (40 mg total) by mouth daily. 90 tablet 3  . trimethoprim (TRIMPEX) 100 MG tablet Take 1 tablet (100 mg total) by mouth daily. 30 tablet 6  . TRULICITY 1.5 HQ/4.6NG SOPN INJECT 1.5MG INTO THE SKIN ONCE A WEEK 4 mL 0   No current facility-administered medications on file prior to visit.      Family Hx: The patient's family history includes COPD in her sister and sister; Cancer in her mother and sister; Diabetes in her brother, brother, brother, and father; Heart attack in her brother, brother, brother, and father; Heart disease in her brother, brother, brother, and father; Hypertension in her brother, brother, brother, and father. There is no history of Breast cancer.  ROS:   Please see the history of present illness.    Review of Systems  Constitutional: Negative.   Respiratory: Negative.   Cardiovascular: Positive for chest pain.  Gastrointestinal: Negative.   Musculoskeletal: Negative.   Neurological: Negative.   Psychiatric/Behavioral: Negative.   All other systems reviewed and are negative.    Labs/Other Tests and Data Reviewed:    Recent Labs: 05/21/2018: ALT 11; BUN 28; Creatinine, Ser 1.59; Hemoglobin 14.2; Platelets 285; Potassium 6.0; Sodium 138; TSH 0.481   Recent Lipid Panel Lab Results  Component Value Date/Time   CHOL 126 05/21/2018 01:55 PM   CHOL 152 01/31/2016 02:19 PM   TRIG 195 (H) 05/21/2018 01:55 PM   TRIG 224 (H) 01/31/2016 02:19 PM   HDL 40 05/21/2018 01:55 PM   CHOLHDL 3.2 09/07/2015 08:05 AM   CHOLHDL 3.4 12/17/2014 10:17 AM   LDLCALC 47 05/21/2018 01:55 PM    Wt Readings from Last 3 Encounters:  08/19/18 210 lb (95.3 kg)  08/15/18  215 lb (97.5 kg)  08/13/18 214 lb (97.1 kg)     Exam:    Vital Signs: Vital signs may also be detailed in the HPI Ht _0  (1.676 m)   Wt 210 lb (95.3 kg)   BMI 33.89 kg/m   Wt Readings from Last 3 Encounters:  08/19/18 210 lb (95.3 kg)  08/15/18 215 lb (97.5 kg)  08/13/18 214 lb (97.1 kg)   Temp Readings from Last 3 Encounters:  08/15/18 97.7 F (36.5 C) (Temporal)  08/13/18 (!) 97.2 F (36.2 C) (Temporal)  07/02/18 97.8 F (36.6 C)   BP Readings from Last 3 Encounters:  08/15/18 137/81  08/13/18 117/76  07/02/18 128/71   Pulse Readings from Last 3 Encounters:  08/15/18 86  08/13/18 80  07/02/18 79    130/70, pulse 80, resp 16  Well nourished, well developed female in no acute distress. Constitutional:  oriented to person, place, and time. No distress.  Head: Normocephalic and atraumatic.  Eyes:  no discharge. No scleral icterus.  Neck: Normal range of motion. Neck supple.  Pulmonary/Chest: No audible wheezing, no distress, appears comfortable Musculoskeletal: Normal range of motion.  no  tenderness or deformity.  Neurological:   Coordination normal. Full exam not performed Skin:  No rash Psychiatric:  normal mood and affect. behavior is normal. Thought content normal.    ASSESSMENT & PLAN:    Preop cardiovascular for lumpectomy breast cancer surgery Acceptable risk, no further testing needed She can hold  anticoagulation at least 2 days prior to the procedure restart when it is acceptable per surgery  Abdominal aortic atherosclerosis ([place]) Diabetes numbers much better, cholesterol at goal Continue aggressive management  PAD (peripheral artery disease) ([place]) As above diabetes numbers better and lipids at goal  Atherosclerosis of native coronary artery of native heart with stable angina pectoris ([place]) Currently with no symptoms of angina. No further workup at this time. Continue current medication regimen.  Morbid obesity due to excess calories ([place]) We  have encouraged continued exercise, careful diet management in an effort to lose weight.  Chest pain, unspecified type Denies any more chest pain Some atypical chest pain where the breast cancer is located " That is how we found the breast cancer" Seems to come on more at rest, tenderness  Essential hypertension Blood pressure is well controlled on today's visit. No changes made to the medications.  Uncontrolled type 2 diabetes mellitus with complication, without long-term current use of insulin ([place]) Numbers much better, HBA1C 7.7  Mixed hyperlipidemia Cholesterol is at goal on the current lipid regimen. No changes to the medications were made.  History of DVT and PE Long discussion with her concerning various treatment options She would decrease the Eliquis dosing down to 2.5 mg twice daily for prophylactic dosing   COVID-19 Education: The signs and symptoms of COVID-19 were discussed with the patient and how to seek care for testing (follow up with PCP or arrange E-visit).  The importance of social distancing was discussed today.  Patient Risk:   After full review of this patients clinical status, I feel that they are at least moderate risk at this time.  Time:   Today, I have spent 25 minutes with the patient with telehealth technology discussing the cardiac and medical problems/diagnoses detailed above   10 min spent reviewing the chart prior to patient visit today   Medication Adjustments/Labs and Tests Ordered: Current medicines are reviewed at length with the patient today.  Concerns regarding medicines are outlined above.   Tests Ordered: No tests ordered   Medication Changes: No changes made   Disposition: Follow-up in 12 months   Signed, Ida Rogue, MD  08/19/2018 10:32 AM    [place] [facility] [address] [place] [address], [place], [place] [address]

## 2018-08-19 ENCOUNTER — Other Ambulatory Visit: Payer: Self-pay

## 2018-08-19 ENCOUNTER — Telehealth (INDEPENDENT_AMBULATORY_CARE_PROVIDER_SITE_OTHER): Payer: Medicare Other | Admitting: Cardiovascular Disease

## 2018-08-19 ENCOUNTER — Telehealth: Payer: Self-pay

## 2018-08-19 ENCOUNTER — Telehealth: Payer: Self-pay | Admitting: *Deleted

## 2018-08-19 VITALS — Ht 66.0 in | Wt 210.0 lb

## 2018-08-19 DIAGNOSIS — E782 Mixed hyperlipidemia: Secondary | ICD-10-CM

## 2018-08-19 DIAGNOSIS — E1165 Type 2 diabetes mellitus with hyperglycemia: Secondary | ICD-10-CM

## 2018-08-19 DIAGNOSIS — C50312 Malignant neoplasm of lower-inner quadrant of left female breast: Secondary | ICD-10-CM

## 2018-08-19 DIAGNOSIS — I10 Essential (primary) hypertension: Secondary | ICD-10-CM

## 2018-08-19 DIAGNOSIS — E118 Type 2 diabetes mellitus with unspecified complications: Secondary | ICD-10-CM

## 2018-08-19 DIAGNOSIS — R079 Chest pain, unspecified: Secondary | ICD-10-CM

## 2018-08-19 DIAGNOSIS — IMO0002 Reserved for concepts with insufficient information to code with codable children: Secondary | ICD-10-CM

## 2018-08-19 DIAGNOSIS — I739 Peripheral vascular disease, unspecified: Secondary | ICD-10-CM

## 2018-08-19 DIAGNOSIS — I700 Atherosclerosis of aorta: Secondary | ICD-10-CM | POA: Diagnosis not present

## 2018-08-19 DIAGNOSIS — I25118 Atherosclerotic heart disease of native coronary artery with other forms of angina pectoris: Secondary | ICD-10-CM

## 2018-08-19 MED ORDER — APIXABAN 2.5 MG PO TABS: ORAL_TABLET | ORAL | 3 refills | Status: DC

## 2018-08-19 MED ORDER — LIDOCAINE-PRILOCAINE 2.5-2.5 % EX CREA: TOPICAL_CREAM | CUTANEOUS | 0 refills | Status: DC

## 2018-08-19 NOTE — Telephone Encounter (Signed)
Patient states she would like to have a left lumpectomy .

## 2018-08-19 NOTE — Telephone Encounter (Signed)
Call to patient to schedule surgery.  Patient's surgery to be scheduled for 08/26/18 at [facility] with Dr. Bary Castilla.  Patient is aware to stop her Eliquis two days prior to surgery, her last dose will be on 08/23/18.  Patient is aware to make use of EMLA cream. She will apply this to her left areola and cover with plastic wrap one hour prior to leaving for surgery on 08/26/18.  The patient is aware she will need to Pre-Admit. Patient will check in at the [place] entrance due to COVID-19 restrictions and will then be escorted to the [place], [address] (first floor). She will have Covid 19 testing done at this appointment.  The patient will be contacted once Pre-admission appointment has been arranged with date and time.   Patient aware to be NPO after midnight and have a driver.   She is aware to check in at the [place] entrance where he/she will be screened for the coronavirus and then sent to Same Day Surgery.   Patient aware that she may have no visitors and driver will need to wait in the car due to COVID-19 restrictions.   The patient verbalizes understanding of the above.   The patient is aware to call the office should he have further questions.

## 2018-08-19 NOTE — Patient Instructions (Addendum)
It was a please speaking with you today! If you have any cardiac concerns prior to your 1 year follow up, please call us at [phone].   Thank you! Heather, RN  Medication Instructions:  Please decrease the eliquis down to 2.5 mg twice a day  If you need a refill on your cardiac medications before your next appointment, please call your pharmacy.    Lab work: No new labs needed   If you have labs (blood work) drawn today and your tests are completely normal, you will receive your results only by: [name] MyChart Message (if you have MyChart) OR . A paper copy in the mail If you have any lab test that is abnormal or we need to change your treatment, we will call you to review the results.   Testing/Procedures: No new testing needed   Follow-Up: At [facility], you and your health needs are our priority.  As part of our continuing mission to provide you with exceptional heart care, we have created designated Provider Care Teams.  These Care Teams include your primary Cardiologist (physician) and Advanced Practice Providers (APPs -  Physician Assistants and Nurse Practitioners) who all work together to provide you with the care you need, when you need it.  . You will need a follow up appointment in 12 months .   Please call our office 2 months in advance to schedule this appointment.  (call in early April 2021 to schedule)  . Providers on your designated Care Team:   . Murray Hodgkins, NP . Christell Faith, PA-C . Marrianne Mood, PA-C  Any Other Special Instructions Will Be Listed Below (If Applicable).  For educational health videos Log in to : [email or web address] Or : [email or web address], password : [email or web address]

## 2018-08-21 ENCOUNTER — Other Ambulatory Visit: Payer: Self-pay | Admitting: General Surgery

## 2018-08-21 ENCOUNTER — Telehealth: Payer: Self-pay | Admitting: *Deleted

## 2018-08-21 ENCOUNTER — Other Ambulatory Visit: Payer: Self-pay | Admitting: Family Medicine

## 2018-08-21 NOTE — Telephone Encounter (Signed)
Patient contacted today and aware that surgery has been scheduled for 08-26-18 at [facility] with Dr. Bary Castilla. The patient is to report to the radiology desk at 7:45 am (second desk on right in [place]).   The patient is aware to have COVID-19 testing done at the time of Pre-admit appointment on 08-26-18. She is aware this is done at the [facility] building drive thru ([address] [place]). She is aware to isolate after, have no visitors, wash hands frequently, and avoid touching face.   The patient is aware she will need to Pre-Admit on 08-22-18 at 9 am. Patient will check in at the [place] entrance due to COVID-19 restrictions and will then be escorted to the [place], [address] (first floor).   Patient aware to have a driver.   She is aware to check in at the [place] entrance where she will be screened for the coronavirus and then sent to Same Day Surgery.   Patient aware that she may have no visitors and driver will need to wait in the car due to COVID-19 restrictions.   Patient reminded to stop Eliquis 2 days prior to procedure.   The patient verbalizes understanding of the above.   The patient is aware to call the office should she have further questions.

## 2018-08-22 ENCOUNTER — Other Ambulatory Visit: Payer: Self-pay

## 2018-08-22 ENCOUNTER — Ambulatory Visit: Payer: Medicare Other | Admitting: Urology

## 2018-08-22 ENCOUNTER — Encounter
Admission: RE | Admit: 2018-08-22 | Discharge: 2018-08-22 | Disposition: A | Discharge status: Home / Self Care | Payer: Medicare Other | Source: Ambulatory Visit | Attending: General Surgery | Admitting: General Surgery

## 2018-08-22 DIAGNOSIS — Z1159 Encounter for screening for other viral diseases: Secondary | ICD-10-CM | POA: Diagnosis not present

## 2018-08-22 DIAGNOSIS — Z01812 Encounter for preprocedural laboratory examination: Secondary | ICD-10-CM | POA: Diagnosis not present

## 2018-08-22 DIAGNOSIS — C50912 Malignant neoplasm of unspecified site of left female breast: Secondary | ICD-10-CM | POA: Diagnosis not present

## 2018-08-22 LAB — BASIC METABOLIC PANEL
Anion gap: 8 (ref 5–15)
BUN: 24 mg/dL — ABNORMAL HIGH (ref 8–23)
CO2: 27 mmol/L (ref 22–32)
Calcium: 8.6 mg/dL — ABNORMAL LOW (ref 8.9–10.3)
Chloride: 106 mmol/L (ref 98–111)
Creatinine, Ser: 1.4 mg/dL — ABNORMAL HIGH (ref 0.44–1.00)
GFR calc Af Amer: 42 mL/min — ABNORMAL LOW (ref >60)
GFR calc non Af Amer: 37 mL/min — ABNORMAL LOW (ref >60)
Glucose, Bld: 159 mg/dL — ABNORMAL HIGH (ref 70–99)
Potassium: 4.7 mmol/L (ref 3.5–5.1)
Sodium: 141 mmol/L (ref 135–145)

## 2018-08-22 LAB — CBC
HCT: 43.3 % (ref 36.0–46.0)
Hemoglobin: 13.3 g/dL (ref 12.0–15.0)
MCH: 27.9 pg (ref 26.0–34.0)
MCHC: 30.7 g/dL (ref 30.0–36.0)
MCV: 90.8 fL (ref 80.0–100.0)
Platelets: 261 10*3/uL (ref 150–400)
RBC: 4.77 MIL/uL (ref 3.87–5.11)
RDW: 13.4 % (ref 11.5–15.5)
WBC: 9.7 10*3/uL (ref 4.0–10.5)
nRBC: 0 % (ref 0.0–0.2)

## 2018-08-22 NOTE — Patient Instructions (Signed)
Your procedure is scheduled on: Monday 08/26/18 Report to  [place] at previously scheduled time. .  Remember: Instructions that are not followed completely may result in serious medical risk, up to and including death, or upon the discretion of your surgeon and anesthesiologist your surgery may need to be rescheduled.     _X__ 1. Do not eat food after midnight the night before your procedure.                 No gum chewing or hard candies. You may drink clear liquids up to 2 hours                 before you are scheduled to arrive for your surgery- DO not drink clear                 liquids within 2 hours of the start of your surgery.                 Clear Liquids include:  water, apple juice without pulp, clear carbohydrate                 drink such as Clearfast or Gatorade, Black Coffee or Tea (Do not add                 anything to coffee or tea).  __X__2.  On the morning of surgery brush your teeth with toothpaste and water, you                 may rinse your mouth with mouthwash if you wish.  Do not swallow any              toothpaste of mouthwash.     _X__ 3.  No Alcohol for 24 hours before or after surgery.   _X__ 4.  Do Not Smoke or use e-cigarettes For 24 Hours Prior to Your Surgery.                 Do not use any chewable tobacco products for at least 6 hours prior to                 surgery.  ____  5.  Bring all medications with you on the day of surgery if instructed.   __X__  6.  Notify your doctor if there is any change in your medical condition      (cold, fever, infections).     Do not wear jewelry, make-up, hairpins, clips or nail polish. Do not wear lotions, powders, or perfumes.  Do not shave 48 hours prior to surgery. Men may shave face and neck. Do not bring valuables to the hospital.    [facility] is not responsible for any belongings or valuables.  Contacts, dentures/partials or body piercings may not be worn into surgery. Bring a case for your  contacts, glasses or hearing aids, a denture cup will be supplied. Leave your suitcase in the car. After surgery it may be brought to your room. For patients admitted to the hospital, discharge time is determined by your treatment team.   Patients discharged the day of surgery will not be allowed to drive home.   Please read over the following fact sheets that you were given:   MRSA Information  __X__ Take these medicines the morning of surgery with A SIP OF WATER:    1. DULoxetine (CYMBALTA)   2. gabapentin (NEURONTIN)  3. isosorbide mononitrate (IMDUR  4. levothyroxine (SYNTHROID  5. pantoprazole (PROTONIX)  6. rosuvastatin (CRESTOR)  7. Take the metoprolol only if you have not taken it the night before  ____ Fleet Enema (as directed)   __X__ Use CHG Soap/SAGE wipes as directed  __X__ Use inhalers on the day of surgery  ____ Stop metformin/Janumet/Farxiga 2 days prior to surgery    ____ Take 1/2 of usual insulin dose the night before surgery. No insulin the morning          of surgery.   __X__ Stop Blood Thinners Coumadin/Plavix/Xarelto/Pleta/Pradaxa/Eliquis/Effient/Aspirin  on   Or contact your Surgeon, Cardiologist or Medical Doctor regarding  ability to stop your blood thinners  __X__ Stop Anti-inflammatories 7 days before surgery such as Advil, Ibuprofen, Motrin,  BC or Goodies Powder, Naprosyn, Naproxen, Aleve, Aspirin    __X__ Stop all herbal supplements, fish oil or vitamin E until after surgery.    ____ Bring C-Pap to the hospital.

## 2018-08-23 LAB — NOVEL CORONAVIRUS, NAA (HOSP ORDER, SEND-OUT TO REF LAB; TAT 18-24 HRS): SARS-CoV-2, NAA: NOT DETECTED

## 2018-08-26 ENCOUNTER — Encounter: Payer: Self-pay | Admitting: *Deleted

## 2018-08-26 ENCOUNTER — Ambulatory Visit: Payer: Medicare Other | Admitting: Certified Registered Nurse Anesthetist

## 2018-08-26 ENCOUNTER — Ambulatory Visit: Payer: Medicare Other

## 2018-08-26 ENCOUNTER — Other Ambulatory Visit: Payer: Self-pay

## 2018-08-26 ENCOUNTER — Ambulatory Visit
Admission: RE | Admit: 2018-08-26 | Discharge: 2018-08-26 | Disposition: A | Discharge status: Home / Self Care | Payer: Medicare Other | Source: Ambulatory Visit | Attending: General Surgery | Admitting: General Surgery

## 2018-08-26 ENCOUNTER — Encounter
Admission: RE | Disposition: A | Discharge status: Home / Self Care | Payer: Self-pay | Source: Ambulatory Visit | Attending: General Surgery

## 2018-08-26 DIAGNOSIS — Z7984 Long term (current) use of oral hypoglycemic drugs: Secondary | ICD-10-CM | POA: Diagnosis not present

## 2018-08-26 DIAGNOSIS — Z87891 Personal history of nicotine dependence: Secondary | ICD-10-CM | POA: Insufficient documentation

## 2018-08-26 DIAGNOSIS — E039 Hypothyroidism, unspecified: Secondary | ICD-10-CM | POA: Insufficient documentation

## 2018-08-26 DIAGNOSIS — C50912 Malignant neoplasm of unspecified site of left female breast: Secondary | ICD-10-CM | POA: Diagnosis not present

## 2018-08-26 DIAGNOSIS — I130 Hypertensive heart and chronic kidney disease with heart failure and stage 1 through stage 4 chronic kidney disease, or unspecified chronic kidney disease: Secondary | ICD-10-CM | POA: Insufficient documentation

## 2018-08-26 DIAGNOSIS — Z91041 Radiographic dye allergy status: Secondary | ICD-10-CM | POA: Diagnosis not present

## 2018-08-26 DIAGNOSIS — Z6833 Body mass index (BMI) 33.0-33.9, adult: Secondary | ICD-10-CM | POA: Insufficient documentation

## 2018-08-26 DIAGNOSIS — J449 Chronic obstructive pulmonary disease, unspecified: Secondary | ICD-10-CM | POA: Insufficient documentation

## 2018-08-26 DIAGNOSIS — Z886 Allergy status to analgesic agent status: Secondary | ICD-10-CM | POA: Insufficient documentation

## 2018-08-26 DIAGNOSIS — N183 Chronic kidney disease, stage 3 (moderate): Secondary | ICD-10-CM | POA: Insufficient documentation

## 2018-08-26 DIAGNOSIS — C50312 Malignant neoplasm of lower-inner quadrant of left female breast: Secondary | ICD-10-CM

## 2018-08-26 DIAGNOSIS — N6320 Unspecified lump in the left breast, unspecified quadrant: Secondary | ICD-10-CM

## 2018-08-26 DIAGNOSIS — Z95820 Peripheral vascular angioplasty status with implants and grafts: Secondary | ICD-10-CM | POA: Insufficient documentation

## 2018-08-26 DIAGNOSIS — Z86711 Personal history of pulmonary embolism: Secondary | ICD-10-CM | POA: Insufficient documentation

## 2018-08-26 DIAGNOSIS — Z885 Allergy status to narcotic agent status: Secondary | ICD-10-CM | POA: Insufficient documentation

## 2018-08-26 DIAGNOSIS — Z882 Allergy status to sulfonamides status: Secondary | ICD-10-CM | POA: Diagnosis not present

## 2018-08-26 DIAGNOSIS — Z7901 Long term (current) use of anticoagulants: Secondary | ICD-10-CM | POA: Insufficient documentation

## 2018-08-26 DIAGNOSIS — E1151 Type 2 diabetes mellitus with diabetic peripheral angiopathy without gangrene: Secondary | ICD-10-CM | POA: Insufficient documentation

## 2018-08-26 DIAGNOSIS — K219 Gastro-esophageal reflux disease without esophagitis: Secondary | ICD-10-CM | POA: Diagnosis not present

## 2018-08-26 DIAGNOSIS — E785 Hyperlipidemia, unspecified: Secondary | ICD-10-CM | POA: Insufficient documentation

## 2018-08-26 DIAGNOSIS — I5032 Chronic diastolic (congestive) heart failure: Secondary | ICD-10-CM | POA: Diagnosis not present

## 2018-08-26 DIAGNOSIS — Z888 Allergy status to other drugs, medicaments and biological substances status: Secondary | ICD-10-CM | POA: Diagnosis not present

## 2018-08-26 DIAGNOSIS — E1122 Type 2 diabetes mellitus with diabetic chronic kidney disease: Secondary | ICD-10-CM | POA: Insufficient documentation

## 2018-08-26 DIAGNOSIS — Z8673 Personal history of transient ischemic attack (TIA), and cerebral infarction without residual deficits: Secondary | ICD-10-CM | POA: Diagnosis not present

## 2018-08-26 DIAGNOSIS — E669 Obesity, unspecified: Secondary | ICD-10-CM | POA: Insufficient documentation

## 2018-08-26 HISTORY — PX: BREAST LUMPECTOMY: SHX2

## 2018-08-26 HISTORY — PX: MASTECTOMY W/ SENTINEL NODE BIOPSY: SHX2001

## 2018-08-26 LAB — GLUCOSE, CAPILLARY: Glucose-Capillary: 157 mg/dL — ABNORMAL HIGH (ref 70–99)

## 2018-08-26 SURGERY — MASTECTOMY WITH SENTINEL LYMPH NODE BIOPSY
Anesthesia: General | Laterality: Left

## 2018-08-26 MED ORDER — METHYLENE BLUE 0.5 % INJ SOLN
INTRAVENOUS | Status: DC | PRN
  Administered 2018-08-26: 5 mL via SUBMUCOSAL

## 2018-08-26 MED ORDER — ONDANSETRON HCL 4 MG/2ML IJ SOLN
INTRAMUSCULAR | Status: DC | PRN
  Administered 2018-08-26: 4 mg via INTRAVENOUS

## 2018-08-26 MED ORDER — PROPOFOL 10 MG/ML IV BOLUS
INTRAVENOUS | Status: AC
  Filled 2018-08-26: qty 40

## 2018-08-26 MED ORDER — ACETAMINOPHEN 10 MG/ML IV SOLN
INTRAVENOUS | Status: AC
  Filled 2018-08-26: qty 100

## 2018-08-26 MED ORDER — ONDANSETRON HCL 4 MG/2ML IJ SOLN
INTRAMUSCULAR | Status: AC
  Filled 2018-08-26: qty 2

## 2018-08-26 MED ORDER — PROPOFOL 500 MG/50ML IV EMUL
INTRAVENOUS | Status: DC | PRN
  Administered 2018-08-26: 75 ug/kg/min via INTRAVENOUS

## 2018-08-26 MED ORDER — LACTATED RINGERS IV SOLN
INTRAVENOUS | Status: DC | PRN
  Administered 2018-08-26: 10:00:00 via INTRAVENOUS

## 2018-08-26 MED ORDER — PHENYLEPHRINE HCL (PRESSORS) 10 MG/ML IV SOLN
INTRAVENOUS | Status: DC | PRN
  Administered 2018-08-26: 200 ug via INTRAVENOUS
  Administered 2018-08-26: 100 ug via INTRAVENOUS

## 2018-08-26 MED ORDER — FENTANYL CITRATE (PF) 100 MCG/2ML IJ SOLN
25.0000 ug | INTRAMUSCULAR | Status: DC | PRN
  Administered 2018-08-26: 25 ug via INTRAVENOUS

## 2018-08-26 MED ORDER — SUGAMMADEX SODIUM 200 MG/2ML IV SOLN
INTRAVENOUS | Status: DC | PRN
  Administered 2018-08-26: 200 mg via INTRAVENOUS

## 2018-08-26 MED ORDER — LIDOCAINE HCL (CARDIAC) PF 100 MG/5ML IV SOSY
PREFILLED_SYRINGE | INTRAVENOUS | Status: DC | PRN
  Administered 2018-08-26: 100 mg via INTRAVENOUS

## 2018-08-26 MED ORDER — SUGAMMADEX SODIUM 200 MG/2ML IV SOLN
INTRAVENOUS | Status: AC
  Filled 2018-08-26: qty 2

## 2018-08-26 MED ORDER — FENTANYL CITRATE (PF) 100 MCG/2ML IJ SOLN
INTRAMUSCULAR | Status: AC
  Filled 2018-08-26: qty 2

## 2018-08-26 MED ORDER — ROCURONIUM BROMIDE 50 MG/5ML IV SOLN
INTRAVENOUS | Status: AC
  Filled 2018-08-26: qty 1

## 2018-08-26 MED ORDER — FENTANYL CITRATE (PF) 100 MCG/2ML IJ SOLN
INTRAMUSCULAR | Status: DC | PRN
  Administered 2018-08-26: 50 ug via INTRAVENOUS

## 2018-08-26 MED ORDER — APIXABAN 2.5 MG PO TABS: ORAL_TABLET | ORAL | 3 refills | Status: DC

## 2018-08-26 MED ORDER — ONDANSETRON HCL 4 MG/2ML IJ SOLN: 4.0000 mg | Freq: Once | INTRAMUSCULAR | Status: DC | PRN

## 2018-08-26 MED ORDER — ACETAMINOPHEN 10 MG/ML IV SOLN
INTRAVENOUS | Status: DC | PRN
  Administered 2018-08-26: 1000 mg via INTRAVENOUS

## 2018-08-26 MED ORDER — SODIUM CHLORIDE 0.9 % IV SOLN
INTRAVENOUS | Status: DC
  Administered 2018-08-26: 09:00:00 via INTRAVENOUS

## 2018-08-26 MED ORDER — ROCURONIUM BROMIDE 100 MG/10ML IV SOLN
INTRAVENOUS | Status: DC | PRN
  Administered 2018-08-26: 40 mg via INTRAVENOUS

## 2018-08-26 MED ORDER — LIDOCAINE HCL (PF) 2 % IJ SOLN
INTRAMUSCULAR | Status: AC
  Filled 2018-08-26: qty 10

## 2018-08-26 MED ORDER — DEXAMETHASONE SODIUM PHOSPHATE 10 MG/ML IJ SOLN
INTRAMUSCULAR | Status: DC | PRN
  Administered 2018-08-26: 8 mg via INTRAVENOUS

## 2018-08-26 MED ORDER — SODIUM CHLORIDE 0.9 % IV SOLN
INTRAVENOUS | Status: DC | PRN
  Administered 2018-08-26: 50 ug/min via INTRAVENOUS

## 2018-08-26 MED ORDER — BUPIVACAINE-EPINEPHRINE (PF) 0.5% -1:200000 IJ SOLN
INTRAMUSCULAR | Status: DC | PRN
  Administered 2018-08-26: 10 mL via PERINEURAL
  Administered 2018-08-26: 20 mL via PERINEURAL

## 2018-08-26 MED ORDER — PHENYLEPHRINE HCL (PRESSORS) 10 MG/ML IV SOLN
INTRAVENOUS | Status: AC
  Filled 2018-08-26: qty 1

## 2018-08-26 MED ORDER — BUPIVACAINE-EPINEPHRINE (PF) 0.5% -1:200000 IJ SOLN
INTRAMUSCULAR | Status: AC
  Filled 2018-08-26: qty 60

## 2018-08-26 MED ORDER — PROPOFOL 500 MG/50ML IV EMUL
INTRAVENOUS | Status: AC
  Filled 2018-08-26: qty 50

## 2018-08-26 MED ORDER — DEXAMETHASONE SODIUM PHOSPHATE 10 MG/ML IJ SOLN
INTRAMUSCULAR | Status: AC
  Filled 2018-08-26: qty 1

## 2018-08-26 MED ORDER — TECHNETIUM TC 99M SULFUR COLLOID FILTERED
1.0000 | Freq: Once | INTRAVENOUS | Status: AC | PRN
  Administered 2018-08-26: 0.725 via INTRADERMAL

## 2018-08-26 MED ORDER — PROPOFOL 10 MG/ML IV BOLUS
INTRAVENOUS | Status: DC | PRN
  Administered 2018-08-26: 170 mg via INTRAVENOUS

## 2018-08-26 SURGICAL SUPPLY — 52 items
APPLIER CLIP 11 MED OPEN (CLIP)
APPLIER CLIP 13 LRG OPEN (CLIP)
BINDER BREAST XLRG (GAUZE/BANDAGES/DRESSINGS) ×2 IMPLANT
BLADE PHOTON ILLUMINATED (MISCELLANEOUS) IMPLANT
BLADE SURG 15 STRL SS SAFETY (BLADE) ×2 IMPLANT
BULB RESERV EVAC DRAIN JP 100C (MISCELLANEOUS) IMPLANT
CANISTER SUCT 1200ML W/VALVE (MISCELLANEOUS) ×2 IMPLANT
CHLORAPREP W/TINT 26 (MISCELLANEOUS) ×2 IMPLANT
CLIP APPLIE 11 MED OPEN (CLIP) IMPLANT
CLIP APPLIE 13 LRG OPEN (CLIP) IMPLANT
CNTNR SPEC 2.5X3XGRAD LEK (MISCELLANEOUS) ×3
CONT SPEC 4OZ STER OR WHT (MISCELLANEOUS) ×3
CONTAINER SPEC 2.5X3XGRAD LEK (MISCELLANEOUS) ×3 IMPLANT
COVER WAND RF STERILE (DRAPES) ×2 IMPLANT
DEVICE DUBIN SPECIMEN MAMMOGRA (MISCELLANEOUS) ×2 IMPLANT
DRAIN CHANNEL JP 15F RND 16 (MISCELLANEOUS) IMPLANT
DRAPE LAPAROTOMY TRNSV 106X77 (MISCELLANEOUS) ×2 IMPLANT
DRSG GAUZE FLUFF 36X18 (GAUZE/BANDAGES/DRESSINGS) ×4 IMPLANT
DRSG TELFA 3X8 NADH (GAUZE/BANDAGES/DRESSINGS) ×2 IMPLANT
ELECT CAUTERY BLADE TIP 2.5 (TIP) ×2
ELECT REM PT RETURN 9FT ADLT (ELECTROSURGICAL) ×2
ELECTRODE CAUTERY BLDE TIP 2.5 (TIP) ×1 IMPLANT
ELECTRODE REM PT RTRN 9FT ADLT (ELECTROSURGICAL) ×1 IMPLANT
GLOVE BIO SURGEON STRL SZ7.5 (GLOVE) ×2 IMPLANT
GLOVE INDICATOR 8.0 STRL GRN (GLOVE) ×2 IMPLANT
GOWN STRL REUS W/ TWL LRG LVL3 (GOWN DISPOSABLE) ×2 IMPLANT
GOWN STRL REUS W/TWL LRG LVL3 (GOWN DISPOSABLE) ×2
LABEL OR SOLS (LABEL) IMPLANT
NDL SAFETY 25GX1.5 (NEEDLE) ×2 IMPLANT
NEEDLE HYPO 22GX1.5 SAFETY (NEEDLE) ×4 IMPLANT
PACK BASIN MINOR ARMC (MISCELLANEOUS) ×2 IMPLANT
PIN SAFETY STRL (MISCELLANEOUS) ×2 IMPLANT
RETRACTOR RING XSMALL (MISCELLANEOUS) ×1 IMPLANT
RTRCTR WOUND ALEXIS 13CM XS SH (MISCELLANEOUS) ×2
SHEARS FOC LG CVD HARMONIC 17C (MISCELLANEOUS) IMPLANT
SLEVE PROBE SENORX GAMMA FIND (MISCELLANEOUS) ×4 IMPLANT
SPONGE LAP 18X18 RF (DISPOSABLE) ×2 IMPLANT
STRIP CLOSURE SKIN 1/2X4 (GAUZE/BANDAGES/DRESSINGS) ×4 IMPLANT
SUT ETHILON 3-0 FS-10 30 BLK (SUTURE) ×2
SUT SILK 2 0 (SUTURE) ×1
SUT SILK 2-0 30XBRD TIE 12 (SUTURE) ×1 IMPLANT
SUT SILK 3 0 (SUTURE) ×1
SUT SILK 3-0 18XBRD TIE 12 (SUTURE) ×1 IMPLANT
SUT VIC AB 2-0 CT1 27 (SUTURE) ×4
SUT VIC AB 2-0 CT1 TAPERPNT 27 (SUTURE) ×4 IMPLANT
SUT VIC AB 2-0 CT2 27 (SUTURE) ×2 IMPLANT
SUT VIC AB 3-0 SH 27 (SUTURE) ×1
SUT VIC AB 3-0 SH 27X BRD (SUTURE) ×1 IMPLANT
SUT VICRYL+ 3-0 144IN (SUTURE) ×2 IMPLANT
SUTURE EHLN 3-0 FS-10 30 BLK (SUTURE) ×1 IMPLANT
SWABSTK COMLB BENZOIN TINCTURE (MISCELLANEOUS) ×2 IMPLANT
TAPE TRANSPORE STRL 2 31045 (GAUZE/BANDAGES/DRESSINGS) ×2 IMPLANT

## 2018-08-26 NOTE — Discharge Instructions (Signed)

## 2018-08-26 NOTE — Anesthesia Preprocedure Evaluation (Signed)
Anesthesia Evaluation  Patient identified by MRN, date of birth, ID band Patient awake    Reviewed: Allergy & Precautions, NPO status , Patient's Chart, lab work & pertinent test results  History of Anesthesia Complications (+) PONV  Airway Mallampati: II       Dental   Pulmonary asthma , neg sleep apnea, COPD,  COPD inhaler, neg recent URI, former smoker,           Cardiovascular hypertension, Pt. on medications + CAD, + Peripheral Vascular Disease and +CHF  (-) Past MI (-) dysrhythmias (-) Valvular Problems/Murmurs     Neuro/Psych neg Seizures TIA (speech difficulties)   GI/Hepatic Neg liver ROS, GERD  Medicated,  Endo/Other  diabetes, Type 2, Oral Hypoglycemic AgentsHypothyroidism   Renal/GU Renal disease (hydronephrosis)     Musculoskeletal   Abdominal   Peds  Hematology   Anesthesia Other Findings   Reproductive/Obstetrics                             Anesthesia Physical Anesthesia Plan  ASA: III  Anesthesia Plan: General   Post-op Pain Management:    Induction: Intravenous  PONV Risk Score and Plan: 4 or greater and Propofol infusion, TIVA and Treatment may vary due to age or medical condition  Airway Management Planned: Nasal Cannula  Additional Equipment:   Intra-op Plan:   Post-operative Plan:   Informed Consent: I have reviewed the patients History and Physical, chart, labs and discussed the procedure including the risks, benefits and alternatives for the proposed anesthesia with the patient or authorized representative who has indicated his/her understanding and acceptance.       Plan Discussed with:   Anesthesia Plan Comments:         Anesthesia Quick Evaluation

## 2018-08-26 NOTE — Op Note (Signed)
Preoperative diagnosis: Invasive ductal carcinoma of the left breast, lower inner quadrant.  Postoperative diagnosis: Same.  Operative procedure: Left breast wide excision with tissue transfer, sentinel node biopsy.  Operating Surgeon: Charlie Pitter, MD Bary Castilla, MD.  Anesthesia: General endotracheal.  Marcaine 0.5% with 1-200,000's of epinephrine, 30 cc.  Estimated blood loss: 10 cc.  Clinical note: This 73 year old woman recently had a mammogram showing a new mass in the lower inner quadrant of the left breast.  Core biopsy showed evidence of invasive mammary carcinoma.  Given her options for management she desired to proceed with breast conservation.  The patient was injected with technetium sulfur colloid prior to the procedure.  Operative note: The patient underwent general endotracheal anesthesia and tolerated this well.  5 cc of 0.5% methylene blue was placed in the subareolar plexus after the skin was cleansed with ChloraPrep.  Attention was turned to the breast.  Ultrasound was used to outline the mass lesion in the lower inner quadrant.  This was outlined in local anesthetic infiltrated approximately 5 cm away from the skin edge for postoperative analgesia.  A radial incision from the edge of the areola was carried down to near the inframammary fold.  The skin was incised sharply and the remaining tissue divided with electrocautery.  Superficial flaps were elevated for placement of an extra small Alexis wound protector.  A block of tissue was then resected that measured 3 x 4 x 4 cm.  Specimen radiograph confirmed the lesion within the specimen including the previously placed clip.  Pathology reported the closest margin is anterior 2 mm.   While the breast specimen was being processed attention was turned to the axilla.  The node seeker device was utilized and showed counts in the 3000 range at the lower aspect of the axilla.  Local anesthesia was infiltrated and a transverse incision  made.  The skin was incised sharply and then a pocket made for the Alexis retractor when the axillary envelope was opened.  The largest hot, blue node with counts of 5000 measured about 8 mm in diameter.  2 smaller nodes were identified one near the apex of the axilla.  All 3 were sent in formalin for routine histology.  A small amount of adipose tissue was sent separately for evaluation.  Good hemostasis was achieved with electrocautery and 3-0 Vicryl ties.  The axillary envelope was closed with interrupted 2-0 Vicryl figure-of-eight sutures.  The adipose layer was approximated in similar fashion.  The skin was closed with a running 4-0 Vicryl subcuticular suture.  With the specimen radiograph and pathology report in hand procedure for wound closure of the breast excision site was undertaken.  The breast was mobilized off the underlying pectoralis muscle by electrocautery for a distance of 5 cm.  The fascia was then approximated along the course of the incision with interrupted 2-0 Vicryl figure-of-eight sutures.  The remaining breast parenchyma and adipose layer was approximated in similar fashion.  The superficial fat was closed with a running 2-0 Vicryl suture.  The skin was closed with a running 4-0 Vicryl subcuticular suture.  Benzoin and Steri-Strips followed by Telfa and Tegaderm dressing was applied.  The patient tolerated the procedure well was taken recovery room in stable condition.

## 2018-08-26 NOTE — H&P (Signed)
No change in clinical history or exam. For left breast wide excision and SLN biopsy.

## 2018-08-26 NOTE — Anesthesia Procedure Notes (Signed)

## 2018-08-26 NOTE — Anesthesia Post-op Follow-up Note (Signed)
Anesthesia QCDR form completed.        

## 2018-08-26 NOTE — Anesthesia Postprocedure Evaluation (Signed)
Anesthesia Post Note  Patient: Tammy [name]  Procedure(s) Performed: PARTIAL MASTECTOMY WIDE EXCISION WITH SENTINEL LYMPH NODE BIOPSY LEFT (Left )  Patient location during evaluation: PACU Anesthesia Type: General Level of consciousness: awake and alert Pain management: pain level controlled Vital Signs Assessment: post-procedure vital signs reviewed and stable Respiratory status: respiratory function stable and spontaneous breathing Cardiovascular status: blood pressure returned to baseline and stable Anesthetic complications: no     Last Vitals:  Vitals:   08/26/18 0906 08/26/18 1204  BP: 118/68 (!) 151/80  Pulse: 72 93  Resp: 18 15  Temp: (!) 36 C (!) 36.4 C  SpO2: 99% 98%    Last Pain:  Vitals:   08/26/18 1204  TempSrc:   PainSc: 0-No pain                 Zaydenn Balaguer K

## 2018-08-26 NOTE — Transfer of Care (Signed)
Immediate Anesthesia Transfer of Care Note  Patient: Tammy [name]  Procedure(s) Performed: PARTIAL MASTECTOMY WIDE EXCISION WITH SENTINEL LYMPH NODE BIOPSY LEFT (Left )  Patient Location: PACU  Anesthesia Type:General  Level of Consciousness: awake, drowsy and patient cooperative  Airway & Oxygen Therapy: Patient Spontanous Breathing and Patient connected to face mask oxygen  Post-op Assessment: Report given to RN and Post -op Vital signs reviewed and stable  Post vital signs: Reviewed and stable  Last Vitals:  Vitals Value Taken Time  BP 151/80 08/26/18 1204  Temp 36.4 C 08/26/18 1204  Pulse 91 08/26/18 1212  Resp 13 08/26/18 1212  SpO2 97 % 08/26/18 1212  Vitals shown include unvalidated device data.  Last Pain:  Vitals:   08/26/18 1204  TempSrc:   PainSc: 0-No pain         Complications: No apparent anesthesia complications

## 2018-08-27 ENCOUNTER — Encounter: Payer: Self-pay | Admitting: General Surgery

## 2018-08-28 ENCOUNTER — Ambulatory Visit: Payer: Medicare Other | Admitting: Family Medicine

## 2018-08-29 ENCOUNTER — Encounter: Payer: Self-pay | Admitting: General Surgery

## 2018-08-29 ENCOUNTER — Ambulatory Visit (INDEPENDENT_AMBULATORY_CARE_PROVIDER_SITE_OTHER): Payer: Medicare Other | Admitting: General Surgery

## 2018-08-29 ENCOUNTER — Other Ambulatory Visit: Payer: Self-pay

## 2018-08-29 ENCOUNTER — Ambulatory Visit: Payer: Medicare Other | Admitting: Gastroenterology

## 2018-08-29 ENCOUNTER — Telehealth: Payer: Self-pay | Admitting: *Deleted

## 2018-08-29 ENCOUNTER — Ambulatory Visit (INDEPENDENT_AMBULATORY_CARE_PROVIDER_SITE_OTHER): Payer: Medicare Other

## 2018-08-29 DIAGNOSIS — C50312 Malignant neoplasm of lower-inner quadrant of left female breast: Secondary | ICD-10-CM

## 2018-08-29 LAB — SURGICAL PATHOLOGY

## 2018-08-29 NOTE — Progress Notes (Signed)
Patient ID: Tammy [name], female   DOB: [birth year], 73 y.o.   MRN: [id number]  Chief Complaint  Patient presents with  . Routine Post Op    Partial Mastectomy    HPI Tammy [name] is a 73 y.o. female.  Here today for partial left mastectomy. No complaints today.  HPI  Past Medical History:  Diagnosis Date  . Asthma   . Benign neoplasm of colon   . Cervical disc disease    "bulging" - no limitations per pt  . Chronic diastolic CHF (congestive heart failure) ([place])    a. 07/2012 Echo: Nl EF. mild inferoseptal HK, Gr 2 DD, mild conc LVH, mild PR/TR, mild PAH.  . CKD (chronic kidney disease), stage III ([place])   . COPD (chronic obstructive pulmonary disease) ([place])   . Coronary artery disease    a. 11/2011 Cath: LAD 80md, LCX min irregs, RCA 70p, 1074mith L->R collats, EF 60%-->Med Rx.  . Diabetes mellitus without complication ([place]  . Fusion of lumbar spine 12/22/2015  . GERD (gastroesophageal reflux disease)   . History of DVT (deep vein thrombosis)   . History of tobacco abuse    a. Quit 2011.  . Marland Kitchenyperlipidemia   . Hypertension   . Hypothyroidism   . Obesity   . Palpitations   . PE (pulmonary embolism) 10/15  . PONV (postoperative nausea and vomiting)   . Pulmonary embolus ([facility]   RLL. Presented with SOB and elevated d-dimer.   . Marland KitchenVD (peripheral vascular disease) ([facility]   a. PTA of right leg and left femoral artery with stenosis.  . Renal insufficiency   . St[facility]   TIAs - no deficits    Past Surgical History:  Procedure Laterality Date  . ANGIOPLASTY / STENTING FEMORAL     PVD; angioplasty right leg and left femoral artery with stenosis.   . APPENDECTOMY  Oct. 2016  . BACK SURGERY  03/2012  . BACK SURGERY     screws, rods replaced with new hardware.  . Marland KitchenACK SURGERY  11/2016  . CARDIAC CATHETERIZATION  12/07/2011   Mid LAD 50%, distal LAD 50%, mid RCA 70%, distal RCA 100%   . CARDIAC CATHETERIZATION  01/04/2011   100% occluded mid RCA with good  collaterals from distal LAD, normal LVEF.  . Marland KitchenHOLECYSTECTOMY    . COLONOSCOPY  2013  . COLONOSCOPY WITH PROPOFOL N/A 05/24/2015   Procedure: COLONOSCOPY WITH PROPOFOL;  Surgeon: DaLucilla LameMD;  [facility];  Laterality: N/A;  Diabetic - oral meds  . LAPAROSCOPIC APPENDECTOMY N/A 01/06/2015   Procedure: APPENDECTOMY LAPAROSCOPIC;  Surgeon: SeChristene LyeMD;  Location: [facility] ORS;  Service: General;  Laterality: N/A;  . MASTECTOMY W/ SENTINEL NODE BIOPSY Left 08/26/2018   Procedure: PARTIAL MASTECTOMY WIDE EXCISION WITH SENTINEL LYMPH NODE BIOPSY LEFT;  Surgeon: ByRobert BellowMD;  Location: [facility] ORS;  Service: General;  Laterality: Left;  . POLYPECTOMY  05/24/2015   Procedure: POLYPECTOMY;  Surgeon: DaLucilla LameMD;  [place] [facility] Service: Endoscopy;;  . THYROIDECTOMY    . TRIGGER FINGER RELEASE      Family History  Problem Relation Age of Onset  . Hypertension Father   . Heart disease Father   . Heart attack Father   . Diabetes Father   . Cancer Mother        colon  . Heart attack Brother   . Diabetes Brother   . Hypertension  Brother   . Heart disease Brother   . Heart attack Brother   . Diabetes Brother   . Hypertension Brother   . Heart disease Brother   . Heart attack Brother   . Diabetes Brother   . Hypertension Brother   . Heart disease Brother   . Cancer Sister        lung  . COPD Sister   . COPD Sister   . Breast cancer Neg Hx     Social History Social History   Tobacco Use  . Smoking status: Former Smoker    Packs/day: 0.50    Years: 15.00    Pack years: 7.50    Types: Cigarettes    Quit date: 03/13/2009    Years since quitting: 9.4  . Smokeless tobacco: Never Used  Substance Use Topics  . Alcohol use: No    Alcohol/week: 0.0 standard drinks  . Drug use: No    Allergies  Allergen Reactions  . Hydrocodone Other (See Comments)    Unresponsive  . Aleve [Naproxen Sodium]     Hives &  itching   . Contrast Media [Iodinated Diagnostic Agents]     Pt avoids due to kidney issues  . Ioxaglate Other (See Comments)    (iodine) Pt avoids due to kidney issues   . Oxycodone Other (See Comments)    hallucinations  . Ranexa [Ranolazine]     Sick on stomach  . Sulfa Antibiotics Other (See Comments)    Pt avoids due to kidney issues  . Tramadol Other (See Comments)    nightmares    Current Outpatient Medications  Medication Sig Dispense Refill  . albuterol (PROAIR HFA) 108 (90 Base) MCG/ACT inhaler INHALE 2 PUFFS EVERY 4 HOURS AS NEEDED FOR WHEEZING OR SHORTNESS OF BREATH 8.5 g 3  . apixaban (ELIQUIS) 2.5 MG TABS tablet Take 1 tablet (2.5 mg) by mouth twice daily 180 tablet 3  . blood glucose meter kit and supplies KIT Dispense based on patient and insurance preference. Use one time daily as directed, E11.9 1 each 0  . DULoxetine (CYMBALTA) 60 MG capsule Take 1 capsule (60 mg total) by mouth daily. 90 capsule 1  . gabapentin (NEURONTIN) 300 MG capsule Take 1 capsule (300 mg total) by mouth 3 (three) times daily. 270 capsule 3  . glipiZIDE (GLUCOTROL) 5 MG tablet TAKE ONE TABLET EVERY DAY BEFORE BREAKFAST 90 tablet 1  . isosorbide mononitrate (IMDUR) 60 MG 24 hr tablet Take 1 tablet (60 mg total) by mouth as directed. Take 1 tablet at bedtime and 1/2 tablet in the morning (Patient taking differently: Take 30-60 mg by mouth See admin instructions. 30 mg in the morning, and 60 mg at bedtime) 135 tablet 3  . levothyroxine (SYNTHROID) 150 MCG tablet Take 1 tablet (150 mcg total) by mouth daily before breakfast. 30 tablet 3  . losartan (COZAAR) 25 MG tablet Take 25 mg by mouth daily.    . metoprolol succinate (TOPROL-XL) 50 MG 24 hr tablet TAKE 1 TABLET BY MOUTH DAILY WITH OR IMMEDIATLEY FOLLOWING A MEAL (Patient taking differently: Take 50 mg by mouth daily. TAKE 1 TABLET BY MOUTH DAILY WITH OR IMMEDIATLEY FOLLOWING A MEAL) 90 tablet 3  . montelukast (SINGULAIR) 10 MG tablet Take 1  tablet (10 mg total) by mouth at bedtime. 30 tablet 3  . nitroGLYCERIN (NITROSTAT) 0.4 MG SL tablet Place 1 tablet (0.4 mg total) under the tongue every 5 (five) minutes as needed for chest pain. 25 tablet 6  .  pantoprazole (PROTONIX) 40 MG tablet Take 1 tablet (40 mg total) by mouth daily as needed. (Patient taking differently: Take 40 mg by mouth daily. ) 90 tablet 1  . pregabalin (LYRICA) 75 MG capsule 1 QHS x1 week, then increase to 1 BID for 1 week, if not too sleepy 2qHS and 1 during the day for 1 week, then 2 BID (Patient taking differently: Take 150 mg by mouth at bedtime. ) 120 capsule 3  . psyllium (METAMUCIL) 58.6 % packet Take 1 packet by mouth daily as needed (fiber).     . rosuvastatin (CRESTOR) 40 MG tablet TAKE 1 TABLET BY MOUTH DAILY 90 tablet 1  . trimethoprim (TRIMPEX) 100 MG tablet Take 1 tablet (100 mg total) by mouth daily. 30 tablet 6  . TRULICITY 1.5 ZL/9.3TT SOPN INJECT 1.5MG INTO THE SKIN ONCE A WEEK (Patient taking differently: Inject 1.5 mg into the skin every Tuesday. ) 4 mL 0   No current facility-administered medications for this visit.     Review of Systems Review of Systems  Constitutional: Negative.   Respiratory: Negative.   Cardiovascular: Negative.     Blood pressure 126/76, pulse 80, temperature (!) 97.5 F (36.4 C), temperature source Temporal, resp. rate 18, height 5' 6"  (1.676 m), weight 215 lb 9.6 oz (97.8 kg), SpO2 92 %.  Physical Exam Physical Exam Constitutional:      Appearance: She is well-developed.  Eyes:     Conjunctiva/sclera: Conjunctivae normal.  Neck:     Musculoskeletal: Normal range of motion.  Cardiovascular:     Rate and Rhythm: Normal rate and regular rhythm.     Heart sounds: Normal heart sounds.  Pulmonary:     Effort: Pulmonary effort is normal.     Breath sounds: Normal breath sounds.  Chest:     Breasts:        Right: No inverted nipple, mass, nipple discharge, skin change or tenderness.        Left: No inverted  nipple, mass, nipple discharge, skin change or tenderness.    Skin:    General: Skin is warm and dry.  Neurological:     Mental Status: She is alert and oriented to person, place, and time.     Data Reviewed Surgical Pathology  CASE: [id number]  PATIENT: Tammy [name]  Surgical Pathology Report      SPECIMEN SUBMITTED:  A. Breast, left lower inner quadrant; wide excision  B. Lymph nodes 1, 2, and 3, left axillary  C. Axillary fat, left   CLINICAL HISTORY:  None provided   PRE-OPERATIVE DIAGNOSIS:  Left breast cancer   POST-OPERATIVE DIAGNOSIS:  Same as pre-op      DIAGNOSIS:  A. BREAST, LEFT LOWER INNER QUADRANT; WIDE EXCISION:  - INVASIVE MAMMARY CARCINOMA.  - DUCTAL CARCINOMA IN SITU.  - SEE CANCER SUMMARY BELOW.  - BIOPSY SITE CHANGE WITH MARKER.   B. LYMPH NODES, LEFT AXILLARY 1, 2, AND 3; EXCISION:  - THREE LYMPH NODES NEGATIVE FOR MALIGNANCY (0/3).   C. AXILLARY FAT, LEFT; EXCISION:  - ONE LYMPH NODE NEGATIVE FOR MALIGNANCY (0/1).   CANCER CASE SUMMARY: INVASIVE CARCINOMA OF THE BREAST  Procedure: Wide excision  Specimen Laterality: Left  Tumor Size: 14 mm  Histologic Type: Invasive carcinoma of no special type  Histologic Grade (Nottingham Histologic Score)    Glandular (Acinar)/Tubular Differentiation: 3       Nuclear Pleomorphism: 2       Mitotic Rate: 2       Overall Grade:  2  Ductal Carcinoma In Situ (DCIS): Present, nuclear grade 2 with expansile  comedonecrosis    Negative for extensive intraductal component  Margins:    Invasive Carcinoma Margins: Uninvolved by invasive carcinoma            Distance from closest margin: 11 mm            Specify closest margin: Anterior    DCIS Margins: Uninvolved by DCIS            Distance from closest margin: 22 mm            Specify closest margin: Medial  Regional Lymph Nodes: Uninvolved by tumor cells             Total Number of Lymph Nodes Examined: 4            Number of Sentinel Nodes Examined: 3  Treatment Effect in the Breast: No known presurgical therapy  Lymphovascular Invasion: Not identified  Pathologic Stage Classification (pTNM, AJCC 8th Edition): pT1c pN0(sn)  Breast Biomarker Testing (per outsidereport): ER positive, PR positive, HER2 negative.    Focused ultrasound of the lower inner quadrant of the right breast was undertaken to determine if the patient would be a candidate for accelerated partial breast radiation.  There is a cavity with a minimum distance approximately 1.6 cm from the skin measuring 1.2 x 1.7 x 2.72 cm.  This may be adequate for accelerated partial breast radiation.  Assessment Doing well status post breast conservation.  Plan With the tumor size above 1 cm, we will arrange for MammaPrint testing.  The patient has been encouraged to wear her bra day and night for support as long as the breast feels heavy.  To refrain from vigorous activity with the left shoulder.  Ice or heat for comfort.  We will plan for a follow-up examination in 12 days.  HPI, Physical Exam, Assessment and Plan have been scribed under the direction and in the presence of [name], MD. Jonnie Finner, [place] 08/29/2018, 2:23 PM

## 2018-08-29 NOTE — Telephone Encounter (Signed)
-----   Message from [name], MD sent at 08/29/2018 10:37 AM EDT ----- Please asked the lab to send tissue for MammaPrint and blueprint testing.  Thank you

## 2018-08-29 NOTE — Telephone Encounter (Signed)
Mammoprint order form completed and faxed with documents to [facility] as requested.

## 2018-08-29 NOTE — Patient Instructions (Signed)
You may shower as usual. Please see your follow up appointment listed below.

## 2018-08-30 NOTE — Progress Notes (Signed)
  Oncology Nurse Navigator Documentation  Navigator Location: [facility] (08/30/18 1200)   )Navigator Encounter Type: Introductory Phone Call (08/30/18 1200)   Abnormal Finding Date: 08/07/18 (08/30/18 1200) Confirmed Diagnosis Date: 08/13/18 (08/30/18 1200) Surgery Date: 08/26/18 (08/30/18 1200)                 Barriers/Navigation Needs: Education (08/30/18 1200) Education: Newly Diagnosed Cancer Education (08/30/18 1200) Interventions: Education;Psycho-Social Support (08/30/18 1200)     Education Method: Verbal;Written (08/30/18 1200)                Time Spent with Patient: 30 (08/30/18 1200)   Received patient post -surgical pathology.  Phoned patient to introduce navigation service. States she is doing well post -op.  Per patient she will receive Breast Cancer Treatment Handbook/folder with hospital service on her follow-up visit with Dr. Bary Castilla on 09/12/18.  Office notified.  Patient states Mammoprint has been sent.  Will follow to see if she has any needs after that appointment.

## 2018-09-02 DIAGNOSIS — C50312 Malignant neoplasm of lower-inner quadrant of left female breast: Secondary | ICD-10-CM | POA: Diagnosis not present

## 2018-09-05 ENCOUNTER — Encounter: Payer: Self-pay | Admitting: General Surgery

## 2018-09-05 ENCOUNTER — Telehealth: Payer: Self-pay | Admitting: General Surgery

## 2018-09-05 NOTE — Telephone Encounter (Signed)
Message left that MammaPrint results had returned and were very favorable, low risk for recurrent disease and little benefit from adjuvant chemotherapy.  Will discuss in detail at follow-up.

## 2018-09-12 ENCOUNTER — Ambulatory Visit (INDEPENDENT_AMBULATORY_CARE_PROVIDER_SITE_OTHER): Payer: Medicare Other

## 2018-09-12 ENCOUNTER — Other Ambulatory Visit: Payer: Self-pay

## 2018-09-12 ENCOUNTER — Ambulatory Visit (INDEPENDENT_AMBULATORY_CARE_PROVIDER_SITE_OTHER): Payer: Medicare Other | Admitting: General Surgery

## 2018-09-12 ENCOUNTER — Encounter: Payer: Self-pay | Admitting: General Surgery

## 2018-09-12 VITALS — BP 131/68 | HR 101 | Temp 97.3°F | Ht 66.0 in | Wt 230.0 lb

## 2018-09-12 DIAGNOSIS — C50312 Malignant neoplasm of lower-inner quadrant of left female breast: Secondary | ICD-10-CM

## 2018-09-12 MED ORDER — CEFADROXIL 500 MG PO CAPS: 500.0000 mg | ORAL_CAPSULE | Freq: Two times a day (BID) | ORAL | 1 refills | Status: DC

## 2018-09-12 NOTE — Patient Instructions (Signed)
Return in one week.

## 2018-09-12 NOTE — Progress Notes (Signed)
Patient ID: Tammy [name], female   DOB: 16-Aug-1945, 73 y.o.   MRN: [id number]  Chief Complaint  Patient presents with  . Routine Post Op    HPI Tammy [name] is a 73 y.o. female here today for her post op left breast wide excision  done on 08/26/2018.  The patient is noted increased tenderness along the wide excision site over the last 2 days.  Neither ice nor heat has provided much relief. HPI  Past Medical History:  Diagnosis Date  . Asthma   . Benign neoplasm of colon   . Cervical disc disease    "bulging" - no limitations per pt  . Chronic diastolic CHF (congestive heart failure) (Tammy [name])    a. 07/2012 Echo: Nl EF. mild inferoseptal HK, Gr 2 DD, mild conc LVH, mild PR/TR, mild PAH.  . CKD (chronic kidney disease), stage III (Tammy [name])   . COPD (chronic obstructive pulmonary disease) (Tammy [name])   . Coronary artery disease    a. 11/2011 Cath: LAD 57md, LCX min irregs, RCA 70p, 104mith L->R collats, EF 60%-->Med Rx.  . Diabetes mellitus without complication ([facility]  . Fusion of lumbar spine 12/22/2015  . GERD (gastroesophageal reflux disease)   . History of DVT (deep vein thrombosis)   . History of tobacco abuse    a. Quit 2011.  . Marland Kitchenyperlipidemia   . Hypertension   . Hypothyroidism   . Obesity   . Palpitations   . PE (pulmonary embolism) 10/15  . PONV (postoperative nausea and vomiting)   . Pulmonary embolus ([facility]/15/2015   RLL. Presented with SOB and elevated d-dimer.   . Marland KitchenVD (peripheral vascular disease) (Tammy [name]   a. PTA of right leg and left femoral artery with stenosis.  . Renal insufficiency   . Stroke ([facility]2015   TIAs - no deficits    Past Surgical History:  Procedure Laterality Date  . ANGIOPLASTY / STENTING FEMORAL     PVD; angioplasty right leg and left femoral artery with stenosis.   . APPENDECTOMY  Oct. 2016  . BACK SURGERY  03/2012  . BACK SURGERY     screws, rods replaced with new hardware.  . Marland KitchenACK SURGERY  11/2016  . CARDIAC CATHETERIZATION  12/07/2011   Mid LAD  50%, distal LAD 50%, mid RCA 70%, distal RCA 100%   . CARDIAC CATHETERIZATION  01/04/2011   100% occluded mid RCA with good collaterals from distal LAD, normal LVEF.  . Marland KitchenHOLECYSTECTOMY    . COLONOSCOPY  2013  . COLONOSCOPY WITH PROPOFOL N/A 05/24/2015   Procedure: COLONOSCOPY WITH PROPOFOL;  Surgeon: DaLucilla LameMD;  Location: [name] Service: Endoscopy;  Laterality: N/A;  Diabetic - oral meds  . LAPAROSCOPIC APPENDECTOMY N/A 01/06/2015   Procedure: APPENDECTOMY LAPAROSCOPIC;  Surgeon: SeChristene LyeMD;  Location: [name];  Service: General;  Laterality: N/A;  . MASTECTOMY W/ SENTINEL NODE BIOPSY Left 08/26/2018   Procedure: PARTIAL MASTECTOMY WIDE EXCISION WITH SENTINEL LYMPH NODE BIOPSY LEFT;  Surgeon: ByRobert BellowMD;  Location: [name];  Service: General;  Laterality: Left;  . POLYPECTOMY  05/24/2015   Procedure: POLYPECTOMY;  Surgeon: DaLucilla LameMD;  Location: [name] Service: Endoscopy;;  . THYROIDECTOMY    . TRIGGER FINGER RELEASE      Family History  Problem Relation Age of Onset  . Hypertension Father   . Heart disease Father   . Heart attack Father   . Diabetes Father   .  Cancer Mother        colon  . Heart attack Brother   . Diabetes Brother   . Hypertension Brother   . Heart disease Brother   . Heart attack Brother   . Diabetes Brother   . Hypertension Brother   . Heart disease Brother   . Heart attack Brother   . Diabetes Brother   . Hypertension Brother   . Heart disease Brother   . Cancer Sister        lung  . COPD Sister   . COPD Sister   . Breast cancer Neg Hx     Social History Social History   Tobacco Use  . Smoking status: Former Smoker    Packs/day: 0.50    Years: 15.00    Pack years: 7.50    Types: Cigarettes    Quit date: 03/13/2009    Years since quitting: 9.5  . Smokeless tobacco: Never Used  Substance Use Topics  . Alcohol use: No    Alcohol/week: 0.0 standard drinks  . Drug use: No     Allergies  Allergen Reactions  . Hydrocodone Other (See Comments)    Unresponsive  . Aleve [Naproxen Sodium]     Hives & itching   . Contrast Media [Iodinated Diagnostic Agents]     Pt avoids due to kidney issues  . Ioxaglate Other (See Comments)    (iodine) Pt avoids due to kidney issues   . Oxycodone Other (See Comments)    hallucinations  . Ranexa [Ranolazine]     Sick on stomach  . Sulfa Antibiotics Other (See Comments)    Pt avoids due to kidney issues  . Tramadol Other (See Comments)    nightmares    Current Outpatient Medications  Medication Sig Dispense Refill  . albuterol (PROAIR HFA) 108 (90 Base) MCG/ACT inhaler INHALE 2 PUFFS EVERY 4 HOURS AS NEEDED FOR WHEEZING OR SHORTNESS OF BREATH 8.5 g 3  . apixaban (ELIQUIS) 2.5 MG TABS tablet Take 1 tablet (2.5 mg) by mouth twice daily 180 tablet 3  . blood glucose meter kit and supplies KIT Dispense based on patient and insurance preference. Use one time daily as directed, E11.9 1 each 0  . cefadroxil (DURICEF) 500 MG capsule Take 1 capsule (500 mg total) by mouth 2 (two) times daily. 14 capsule 1  . DULoxetine (CYMBALTA) 60 MG capsule Take 1 capsule (60 mg total) by mouth daily. 90 capsule 1  . gabapentin (NEURONTIN) 300 MG capsule Take 1 capsule (300 mg total) by mouth 3 (three) times daily. 270 capsule 3  . glipiZIDE (GLUCOTROL) 5 MG tablet TAKE ONE TABLET EVERY DAY BEFORE BREAKFAST 90 tablet 1  . isosorbide mononitrate (IMDUR) 60 MG 24 hr tablet Take 1 tablet (60 mg total) by mouth as directed. Take 1 tablet at bedtime and 1/2 tablet in the morning (Patient taking differently: Take 30-60 mg by mouth See admin instructions. 30 mg in the morning, and 60 mg at bedtime) 135 tablet 3  . levothyroxine (SYNTHROID) 150 MCG tablet Take 1 tablet (150 mcg total) by mouth daily before breakfast. 30 tablet 3  . losartan (COZAAR) 25 MG tablet Take 25 mg by mouth daily.    . metoprolol succinate (TOPROL-XL) 50 MG 24 hr tablet TAKE 1  TABLET BY MOUTH DAILY WITH OR IMMEDIATLEY FOLLOWING A MEAL (Patient taking differently: Take 50 mg by mouth daily. TAKE 1 TABLET BY MOUTH DAILY WITH OR IMMEDIATLEY FOLLOWING A MEAL) 90 tablet 3  . montelukast (  SINGULAIR) 10 MG tablet Take 1 tablet (10 mg total) by mouth at bedtime. 30 tablet 3  . nitroGLYCERIN (NITROSTAT) 0.4 MG SL tablet Place 1 tablet (0.4 mg total) under the tongue every 5 (five) minutes as needed for chest pain. 25 tablet 6  . pantoprazole (PROTONIX) 40 MG tablet Take 1 tablet (40 mg total) by mouth daily as needed. (Patient taking differently: Take 40 mg by mouth daily. ) 90 tablet 1  . pregabalin (LYRICA) 75 MG capsule 1 QHS x1 week, then increase to 1 BID for 1 week, if not too sleepy 2qHS and 1 during the day for 1 week, then 2 BID (Patient taking differently: Take 150 mg by mouth at bedtime. ) 120 capsule 3  . psyllium (METAMUCIL) 58.6 % packet Take 1 packet by mouth daily as needed (fiber).     . rosuvastatin (CRESTOR) 40 MG tablet TAKE 1 TABLET BY MOUTH DAILY 90 tablet 1  . trimethoprim (TRIMPEX) 100 MG tablet Take 1 tablet (100 mg total) by mouth daily. 30 tablet 6  . TRULICITY 1.5 UR/4.2HC SOPN INJECT 1.5MG INTO THE SKIN ONCE A WEEK (Patient taking differently: Inject 1.5 mg into the skin every Tuesday. ) 4 mL 0   No current facility-administered medications for this visit.     Review of Systems Review of Systems  Constitutional: Negative.   Respiratory: Negative.   Cardiovascular: Negative.     Blood pressure 131/68, pulse (!) 101, temperature (!) 97.3 F (36.3 C), temperature source Skin, height 5' 6" (1.676 m), weight 230 lb (104.3 kg), SpO2 92 %.  Physical Exam Physical Exam Constitutional:      Appearance: Normal appearance.  Chest:    Skin:    General: Skin is warm and dry.  Neurological:     Mental Status: She is alert and oriented to person, place, and time.     Data Reviewed Ultrasound showed increased fluid between the deep breast tissue  in the pectoralis fascia.  After ChloraPrep and 1 cc of 1% plain Xylocaine approximately 30 cc of somewhat turbid fluid consistent with an old hematoma was removed.  The fluid was odorless.  Culture was sent.  Marked improvement in discomfort after aspiration. No images.  Assessment Possible early infection at wide excision site pending culture.  Plan The patient is placed on amoxicillin 875 mg p.o. twice daily for 1 week course.  Return on one week.  HPI, Physical Exam, Assessment and Plan have been scribed under the direction and in the presence of Hervey Ard, MD.  Gaspar Cola, CMA  I have completed the exam and reviewed the above documentation for accuracy and completeness.  I agree with the above.  Haematologist has been used and any errors in dictation or transcription are unintentional.  Hervey Ard, M.D., F.A.C.S.  Forest Gleason Alfhild Partch 09/13/2018, 1:28 PM

## 2018-09-18 ENCOUNTER — Encounter: Payer: Self-pay | Admitting: Gastroenterology

## 2018-09-18 ENCOUNTER — Other Ambulatory Visit: Payer: Self-pay | Admitting: Neurology

## 2018-09-18 ENCOUNTER — Ambulatory Visit (INDEPENDENT_AMBULATORY_CARE_PROVIDER_SITE_OTHER): Payer: Medicare Other | Admitting: Gastroenterology

## 2018-09-18 ENCOUNTER — Other Ambulatory Visit: Payer: Self-pay

## 2018-09-18 ENCOUNTER — Other Ambulatory Visit (HOSPITAL_COMMUNITY): Payer: Self-pay | Admitting: Neurology

## 2018-09-18 VITALS — BP 115/70 | HR 87 | Temp 97.4°F | Ht 66.0 in | Wt 214.6 lb

## 2018-09-18 DIAGNOSIS — H93A1 Pulsatile tinnitus, right ear: Secondary | ICD-10-CM | POA: Diagnosis not present

## 2018-09-18 DIAGNOSIS — K5900 Constipation, unspecified: Secondary | ICD-10-CM | POA: Diagnosis not present

## 2018-09-18 LAB — ANAEROBIC AND AEROBIC CULTURE

## 2018-09-18 NOTE — Progress Notes (Signed)
[name], MD [address]  [place]  [place], [place] [address]  Main: [phone]  Fax: [phone]   Primary Care Physician: [name], DO   Chief Complaint  Patient presents with  . Constipation    HPI: Tammy [name] is a 73 y.o. female here for follow-up of constipation.  She is taking Metamucil and this has helped significantly.  However, sometimes forgets taking it and goes 2 days without a bowel movement but as long as she starts taking it she has bowel movements.  Previously she was also reporting loose stools after days of not having a bowel movement and now this has completely resolved with resolution of constipation.  Her loose stools were thought to be due to possible postobstructive diarrhea and resolution of symptoms with resolution of constipation is also consistent with the same.  No blood in stool.  No weight loss.  No abdominal pain.  No nausea or vomiting.  She also had an upper GI study done as on last visit she had reported some intermittent nausea.  She states this has resolved.  Upper GI study showed small hiatal hernia, reflux and esophageal spasm.  She reports her reflux is well controlled with omeprazole.  Without the omeprazole she has significant reflux symptoms.   Had a colonoscopy in March 2017 due to chronic diarrhea as well.  6 subcentimeter polyps were removed.  Terminal ileum was examined and appeared normal.  Diverticulosis and internal hemorrhoids were reported.  I do not have the pathology report available, but letter sent to patient under letters tab shows repeat was recommended in 5 years due to precancerous polyps.  Patient also had a positive fecal occult blood test in March 2020  Current Outpatient Medications  Medication Sig Dispense Refill  . albuterol (PROAIR HFA) 108 (90 Base) MCG/ACT inhaler INHALE 2 PUFFS EVERY 4 HOURS AS NEEDED FOR WHEEZING OR SHORTNESS OF BREATH 8.5 g 3  . apixaban (ELIQUIS) 2.5 MG TABS tablet  Take 1 tablet (2.5 mg) by mouth twice daily 180 tablet 3  . blood glucose meter kit and supplies KIT Dispense based on patient and insurance preference. Use one time daily as directed, E11.9 1 each 0  . cefadroxil (DURICEF) 500 MG capsule Take 1 capsule (500 mg total) by mouth 2 (two) times daily. 14 capsule 1  . DULoxetine (CYMBALTA) 60 MG capsule Take 1 capsule (60 mg total) by mouth daily. 90 capsule 1  . gabapentin (NEURONTIN) 300 MG capsule Take 1 capsule (300 mg total) by mouth 3 (three) times daily. 270 capsule 3  . glipiZIDE (GLUCOTROL) 5 MG tablet TAKE ONE TABLET EVERY DAY BEFORE BREAKFAST 90 tablet 1  . isosorbide mononitrate (IMDUR) 60 MG 24 hr tablet Take 1 tablet (60 mg total) by mouth as directed. Take 1 tablet at bedtime and 1/2 tablet in the morning (Patient taking differently: Take 30-60 mg by mouth See admin instructions. 30 mg in the morning, and 60 mg at bedtime) 135 tablet 3  . levothyroxine (SYNTHROID) 150 MCG tablet Take 1 tablet (150 mcg total) by mouth daily before breakfast. 30 tablet 3  . losartan (COZAAR) 25 MG tablet Take 25 mg by mouth daily.    . metoprolol succinate (TOPROL-XL) 50 MG 24 hr tablet TAKE 1 TABLET BY MOUTH DAILY WITH OR IMMEDIATLEY FOLLOWING A MEAL (Patient taking differently: Take 50 mg by mouth daily. TAKE 1 TABLET BY MOUTH DAILY WITH OR IMMEDIATLEY FOLLOWING A MEAL) 90 tablet 3  .  nitroGLYCERIN (NITROSTAT) 0.4 MG SL tablet Place 1 tablet (0.4 mg total) under the tongue every 5 (five) minutes as needed for chest pain. 25 tablet 6  . pantoprazole (PROTONIX) 40 MG tablet Take 1 tablet (40 mg total) by mouth daily as needed. (Patient taking differently: Take 40 mg by mouth daily. ) 90 tablet 1  . pregabalin (LYRICA) 75 MG capsule 1 QHS x1 week, then increase to 1 BID for 1 week, if not too sleepy 2qHS and 1 during the day for 1 week, then 2 BID (Patient taking differently: Take 150 mg by mouth at bedtime. ) 120 capsule 3  . psyllium (METAMUCIL) 58.6 % packet  Take 1 packet by mouth daily as needed (fiber).     . rosuvastatin (CRESTOR) 40 MG tablet TAKE 1 TABLET BY MOUTH DAILY 90 tablet 1  . trimethoprim (TRIMPEX) 100 MG tablet Take 1 tablet (100 mg total) by mouth daily. 30 tablet 6  . TRULICITY 1.5 WR/6.0AV SOPN INJECT 1.5MG INTO THE SKIN ONCE A WEEK (Patient taking differently: Inject 1.5 mg into the skin every Tuesday. ) 4 mL 0  . montelukast (SINGULAIR) 10 MG tablet Take 1 tablet (10 mg total) by mouth at bedtime. (Patient not taking: Reported on 09/18/2018) 30 tablet 3   No current facility-administered medications for this visit.     Allergies as of 09/18/2018 - Review Complete 09/18/2018  Allergen Reaction Noted  . Hydrocodone Other (See Comments) 12/29/2016  . Aleve [naproxen sodium]  06/13/2013  . Contrast media [iodinated diagnostic agents]  05/19/2015  . Ioxaglate Other (See Comments) 10/01/2015  . Oxycodone Other (See Comments) 01/15/2014  . Ranexa [ranolazine]  06/13/2013  . Sulfa antibiotics Other (See Comments) 05/19/2015  . Tramadol Other (See Comments) 01/15/2014    ROS:  General: Negative for anorexia, weight loss, fever, chills, fatigue, weakness. ENT: Negative for hoarseness, difficulty swallowing , nasal congestion. CV: Negative for chest pain, angina, palpitations, dyspnea on exertion, peripheral edema.  Respiratory: Negative for dyspnea at rest, dyspnea on exertion, cough, sputum, wheezing.  GI: See history of present illness. GU:  Negative for dysuria, hematuria, urinary incontinence, urinary frequency, nocturnal urination.  Endo: Negative for unusual weight change.    Physical Examination:   BP 115/70   Pulse 87   Temp (!) 97.4 F (36.3 C) (Oral)   Ht 5' 6"  (1.676 m)   Wt 214 lb 9.6 oz (97.3 kg)   BMI 34.64 kg/m   General: Well-nourished, well-developed in no acute distress.  Eyes: No icterus. Conjunctivae pink. Mouth: Oropharyngeal mucosa moist and pink , no lesions erythema or exudate. Neck: Supple,  Trachea midline Abdomen: Bowel sounds are normal, nontender, nondistended, no hepatosplenomegaly or masses, no abdominal bruits or hernia , no rebound or guarding.   Extremities: No lower extremity edema. No clubbing or deformities. Neuro: Alert and oriented x 3.  Grossly intact. Skin: Warm and dry, no jaundice.   Psych: Alert and cooperative, normal mood and affect.   Labs: CMP     Component Value Date/Time   NA 141 08/22/2018 0954   NA 138 05/21/2018 1355   NA 140 09/20/2011 1134   K 4.7 08/22/2018 0954   K 4.4 09/20/2011 1134   CL 106 08/22/2018 0954   CL 103 09/20/2011 1134   CO2 27 08/22/2018 0954   CO2 28 09/20/2011 1134   GLUCOSE 159 (H) 08/22/2018 0954   GLUCOSE 139 (H) 09/20/2011 1134   BUN 24 (H) 08/22/2018 0954   BUN 28 (H) 05/21/2018 1355  BUN 17 09/20/2011 1134   CREATININE 1.40 (H) 08/22/2018 0954   CREATININE 1.31 (H) 09/20/2011 1134   CALCIUM 8.6 (L) 08/22/2018 0954   CALCIUM 8.4 (L) 09/20/2011 1134   PROT 7.2 05/21/2018 1355   ALBUMIN 4.4 05/21/2018 1355   AST 17 05/21/2018 1355   ALT 11 05/21/2018 1355   ALKPHOS 75 05/21/2018 1355   BILITOT 0.5 05/21/2018 1355   GFRNONAA 37 (L) 08/22/2018 0954   GFRNONAA 42 (L) 09/20/2011 1134   GFRAA 42 (L) 08/22/2018 0954   GFRAA 48 (L) 09/20/2011 1134   Lab Results  Component Value Date   WBC 9.7 08/22/2018   HGB 13.3 08/22/2018   HCT 43.3 08/22/2018   MCV 90.8 08/22/2018   PLT 261 08/22/2018    Imaging Studies: Nm Sentinel Node Injection  Result Date: 08/26/2018 CLINICAL DATA:  Left breast cancer. EXAM: NUCLEAR MEDICINE BREAST LYMPHOSCINTIGRAPHY LEFT BREAST TECHNIQUE: Intradermal injection of radiopharmaceutical was performed at the 12 o'clock, 3 o'clock, 6 o'clock, and 9 o'clock positions around the left nipple. The patient was then sent to the operating room where the sentinel node(s) were identified and removed by the surgeon. RADIOPHARMACEUTICALS:  Total of 0.725 mCi Millipore-filtered Technetium-73m sulfur colloid, injected in four aliquots of 0.25 mCi each. IMPRESSION: Uncomplicated intradermal injection of a total of 0.725 mCi Technetium-934mulfur colloid for purposes of sentinel node identification. Electronically Signed   By: ThMarcello MooresRegister   On: 08/26/2018 09:41   UsKore[facility]ast Complete Uni Left Inc Axilla  Result Date: 08/29/2018 Focused ultrasound of the lower inner quadrant of the right breast was undertaken to determine if the patient would be a candidate for accelerated partial breast radiation.  There is a cavity with a minimum distance approximately 1.6 cm from the skin measuring 1.2 x 1.7 x 2.72 cm.  This may be adequate for accelerated partial breast radiation.    Assessment and Plan:   EdARIA JARRARDs a [age].o. y/o female with previous constipation and diarrhea  Both constipation and diarrhea have now resolved with Metamucil Loose stools and diarrhea were likely due to post obstructive symptoms from having constipation for days and this is now resolved  Continue Metamucil regularly  Her positive FOBT test March 2020 were discussed again.  We discussed that given her colonoscopy in 2017 the likelihood that the FOBT represents a true malignancy is low.  However, I cannot say 100% that there is no malignancy until doing a colonoscopy.  Patient is agreeable to colonoscopy this year however, she was recently diagnosed with breast cancer and is starting radiation therapy soon.  She would like to wait till she starts the radiation treatment and completes that before scheduling her colonoscopy.  Her upper GI study showed hernia,  And reflux.  Reflux well controlled with omeprazole (Risks of PPI use were discussed with patient including bone loss, C. Diff diarrhea, pneumonia, infections, CKD, electrolyte abnormalities.  If clinically possible based on symptoms, goal would be to maintain patient on the lowest dose possible, or discontinue the medication with institution of acid reflux  lifestyle modifications over time. Pt. Verbalizes understanding and chooses to continue the medication.)  Patient does not want to discontinue her omeprazole at this time as she is afraid of reflux coming back  Mildly thickened rugal folds seen during her upper GI study can be evaluated with an EGD at the time of her colonoscopy when patient is agreeable for procedures after her radiation treatment  Patient will follow-up with me in  clinic and we can reassess if her radiation treatment is complete and if she is agreeable for scheduling her procedures at that time  Patient agreeable to the above plan I have encouraged her to call us with any changes in symptoms or any concerns or questions in the meantime  Dr [name]

## 2018-09-19 ENCOUNTER — Encounter: Payer: Self-pay | Admitting: General Surgery

## 2018-09-19 ENCOUNTER — Ambulatory Visit (INDEPENDENT_AMBULATORY_CARE_PROVIDER_SITE_OTHER): Payer: Medicare Other | Admitting: General Surgery

## 2018-09-19 VITALS — BP 138/72 | HR 89 | Temp 97.8°F | Ht 66.0 in | Wt 212.0 lb

## 2018-09-19 DIAGNOSIS — C50312 Malignant neoplasm of lower-inner quadrant of left female breast: Secondary | ICD-10-CM

## 2018-09-19 MED ORDER — PENICILLIN V POTASSIUM 500 MG PO TABS: 500.0000 mg | ORAL_TABLET | Freq: Four times a day (QID) | ORAL | 1 refills | Status: DC

## 2018-09-19 NOTE — Progress Notes (Signed)
Patient ID: Tammy [name], female   DOB: 1946-02-13, 73 y.o.   MRN: [id number]  Chief Complaint  Patient presents with  . Routine Post Op    HPI Tammy [name] is a 73 y.o. female here today for her post op left breast wide excision  done on 08/26/2018. Patient states doing well.  HPI  Past Medical History:  Diagnosis Date  . Asthma   . Benign neoplasm of colon   . Cervical disc disease    "bulging" - no limitations per pt  . Chronic diastolic CHF (congestive heart failure) ([place])    a. 07/2012 Echo: Nl EF. mild inferoseptal HK, Gr 2 DD, mild conc LVH, mild PR/TR, mild PAH.  . CKD (chronic kidney disease), stage III ([place])   . COPD (chronic obstructive pulmonary disease) ([place])   . Coronary artery disease    a. 11/2011 Cath: LAD 79md, LCX min irregs, RCA 70p, 1064mith L->R collats, EF 60%-->Med Rx.  . Diabetes mellitus without complication ([facility]  . Fusion of lumbar spine 12/22/2015  . GERD (gastroesophageal reflux disease)   . History of DVT (deep vein thrombosis)   . History of tobacco abuse    a. Quit 2011.  . Marland Kitchenyperlipidemia   . Hypertension   . Hypothyroidism   . Obesity   . Palpitations   . PE (pulmonary embolism) 10/15  . PONV (postoperative nausea and vomiting)   . Pulmonary embolus ([place]   RLL. Presented with SOB and elevated d-dimer.   . Marland KitchenVD (peripheral vascular disease) (HCM[place]   a. PTA of right leg and left femoral artery with stenosis.  . Renal insufficiency   . [facility]   TIAs - no deficits    Past Surgical History:  Procedure Laterality Date  . ANGIOPLASTY / STENTING FEMORAL     PVD; angioplasty right leg and left femoral artery with stenosis.   . APPENDECTOMY  Oct. 2016  . BACK SURGERY  03/2012  . BACK SURGERY     screws, rods replaced with new hardware.  . Marland KitchenACK SURGERY  11/2016  . CARDIAC CATHETERIZATION  12/07/2011   Mid LAD 50%, distal LAD 50%, mid RCA 70%, distal RCA 100%   . CARDIAC CATHETERIZATION  01/04/2011   100% occluded mid RCA  with good collaterals from distal LAD, normal LVEF.  . Marland KitchenHOLECYSTECTOMY    . COLONOSCOPY  2013  . COLONOSCOPY WITH PROPOFOL N/A 05/24/2015   Procedure: COLONOSCOPY WITH PROPOFOL;  Surgeon: DaLucilla LameMD;  [place] Service: Endoscopy;  Laterality: N/A;  Diabetic - oral meds  . LAPAROSCOPIC APPENDECTOMY N/A 01/06/2015   Procedure: APPENDECTOMY LAPAROSCOPIC;  Surgeon: SeChristene LyeMD;  Location: [facility] ORS;  Service: General;  Laterality: N/A;  . MASTECTOMY W/ SENTINEL NODE BIOPSY Left 08/26/2018   Procedure: PARTIAL MASTECTOMY WIDE EXCISION WITH SENTINEL LYMPH NODE BIOPSY LEFT;  Surgeon: ByRobert BellowMD;  Location: [facility] ORS;  Service: General;  Laterality: Left;  . POLYPECTOMY  05/24/2015   Procedure: POLYPECTOMY;  Surgeon: DaLucilla LameMD;  [place]oscopy;;  . THYROIDECTOMY    . TRIGGER FINGER RELEASE      Family History  Problem Relation Age of Onset  . Hypertension Father   . Heart disease Father   . Heart attack Father   . Diabetes Father   . Cancer Mother        colon  . Heart attack Brother   . Diabetes Brother   .  Hypertension Brother   . Heart disease Brother   . Heart attack Brother   . Diabetes Brother   . Hypertension Brother   . Heart disease Brother   . Heart attack Brother   . Diabetes Brother   . Hypertension Brother   . Heart disease Brother   . Cancer Sister        lung  . COPD Sister   . COPD Sister   . Breast cancer Neg Hx     Social History Social History   Tobacco Use  . Smoking status: Former Smoker    Packs/day: 0.50    Years: 15.00    Pack years: 7.50    Types: Cigarettes    Quit date: 03/13/2009    Years since quitting: 9.5  . Smokeless tobacco: Never Used  Substance Use Topics  . Alcohol use: No    Alcohol/week: 0.0 standard drinks  . Drug use: No    Allergies  Allergen Reactions  . Hydrocodone Other (See Comments)    Unresponsive  . Aleve [Naproxen Sodium]      Hives & itching   . Contrast Media [Iodinated Diagnostic Agents]     Pt avoids due to kidney issues  . Ioxaglate Other (See Comments)    (iodine) Pt avoids due to kidney issues   . Oxycodone Other (See Comments)    hallucinations  . Ranexa [Ranolazine]     Sick on stomach  . Sulfa Antibiotics Other (See Comments)    Pt avoids due to kidney issues  . Tramadol Other (See Comments)    nightmares    Current Outpatient Medications  Medication Sig Dispense Refill  . albuterol (PROAIR HFA) 108 (90 Base) MCG/ACT inhaler INHALE 2 PUFFS EVERY 4 HOURS AS NEEDED FOR WHEEZING OR SHORTNESS OF BREATH 8.5 g 3  . apixaban (ELIQUIS) 2.5 MG TABS tablet Take 1 tablet (2.5 mg) by mouth twice daily 180 tablet 3  . blood glucose meter kit and supplies KIT Dispense based on patient and insurance preference. Use one time daily as directed, E11.9 1 each 0  . cefadroxil (DURICEF) 500 MG capsule Take 1 capsule (500 mg total) by mouth 2 (two) times daily. 14 capsule 1  . DULoxetine (CYMBALTA) 60 MG capsule Take 1 capsule (60 mg total) by mouth daily. 90 capsule 1  . gabapentin (NEURONTIN) 300 MG capsule Take 1 capsule (300 mg total) by mouth 3 (three) times daily. 270 capsule 3  . glipiZIDE (GLUCOTROL) 5 MG tablet TAKE ONE TABLET EVERY DAY BEFORE BREAKFAST 90 tablet 1  . isosorbide mononitrate (IMDUR) 60 MG 24 hr tablet Take 1 tablet (60 mg total) by mouth as directed. Take 1 tablet at bedtime and 1/2 tablet in the morning (Patient taking differently: Take 30-60 mg by mouth See admin instructions. 30 mg in the morning, and 60 mg at bedtime) 135 tablet 3  . levothyroxine (SYNTHROID) 150 MCG tablet Take 1 tablet (150 mcg total) by mouth daily before breakfast. 30 tablet 3  . losartan (COZAAR) 25 MG tablet Take 25 mg by mouth daily.    . metoprolol succinate (TOPROL-XL) 50 MG 24 hr tablet TAKE 1 TABLET BY MOUTH DAILY WITH OR IMMEDIATLEY FOLLOWING A MEAL (Patient taking differently: Take 50 mg by mouth daily. TAKE 1  TABLET BY MOUTH DAILY WITH OR IMMEDIATLEY FOLLOWING A MEAL) 90 tablet 3  . montelukast (SINGULAIR) 10 MG tablet Take 1 tablet (10 mg total) by mouth at bedtime. 30 tablet 3  . nitroGLYCERIN (NITROSTAT) 0.4 MG  SL tablet Place 1 tablet (0.4 mg total) under the tongue every 5 (five) minutes as needed for chest pain. 25 tablet 6  . pantoprazole (PROTONIX) 40 MG tablet Take 1 tablet (40 mg total) by mouth daily as needed. (Patient taking differently: Take 40 mg by mouth daily. ) 90 tablet 1  . pregabalin (LYRICA) 75 MG capsule 1 QHS x1 week, then increase to 1 BID for 1 week, if not too sleepy 2qHS and 1 during the day for 1 week, then 2 BID (Patient taking differently: Take 150 mg by mouth at bedtime. ) 120 capsule 3  . psyllium (METAMUCIL) 58.6 % packet Take 1 packet by mouth daily as needed (fiber).     . rosuvastatin (CRESTOR) 40 MG tablet TAKE 1 TABLET BY MOUTH DAILY 90 tablet 1  . trimethoprim (TRIMPEX) 100 MG tablet Take 1 tablet (100 mg total) by mouth daily. 30 tablet 6  . TRULICITY 1.5 VG/6.8DP SOPN INJECT 1.5MG INTO THE SKIN ONCE A WEEK (Patient taking differently: Inject 1.5 mg into the skin every Tuesday. ) 4 mL 0  . penicillin v potassium (VEETID) 500 MG tablet Take 1 tablet (500 mg total) by mouth 4 (four) times daily. 40 tablet 1   No current facility-administered medications for this visit.     Review of Systems Review of Systems  Constitutional: Negative.   Respiratory: Negative.   Cardiovascular: Negative.     Blood pressure 138/72, pulse 89, temperature 97.8 F (36.6 C), temperature source Skin, height 5' 6"  (1.676 m), weight 212 lb (96.2 kg), SpO2 96 %.  Physical Exam Physical Exam Exam conducted with a chaperone present.  Chest:       Data Reviewed Ultrasound showed a small fluid collection at about the 2.5 cm mark.  Alcohol followed by 1% Xylocaine, 1 cc and 20-gauge needle for aspiration was completed with 2 cc of turbid fluid.  Incomplete resolution.   Well-tolerated.  No images.  No charge. September 12, 2018 culture: Component 7d ago  Anaerobic Culture Final reportAbnormal    Result 1 CommentAbnormal    Comment: Propionibacterium avidum   This anaerobe is typically sensitive to penicillin.  Assessment Seroma with possible infection.  Plan The patient will discontinu her Duricef and initiate Pen-Vee K 500 mg p.o. 4 times daily.  Continue local heat. Return in one week. Rx sent   HPI, Physical Exam, Assessment and Plan have been scribed under the direction and in the presence of Hervey Ard, MD.  Gaspar Cola, CMA  I have completed the exam and reviewed the above documentation for accuracy and completeness.  I agree with the above.  Haematologist has been used and any errors in dictation or transcription are unintentional.  Hervey Ard, M.D., F.A.C.S.  Forest Gleason Lugene Hitt 09/19/2018, 7:39 PM

## 2018-09-20 ENCOUNTER — Ambulatory Visit
Admission: RE | Admit: 2018-09-20 | Discharge: 2018-09-20 | Disposition: A | Discharge status: Home / Self Care | Payer: Medicare Other | Source: Ambulatory Visit | Attending: Neurology | Admitting: Neurology

## 2018-09-20 ENCOUNTER — Other Ambulatory Visit: Payer: Self-pay

## 2018-09-20 DIAGNOSIS — H93A1 Pulsatile tinnitus, right ear: Secondary | ICD-10-CM

## 2018-09-24 ENCOUNTER — Other Ambulatory Visit: Payer: Self-pay

## 2018-09-24 ENCOUNTER — Ambulatory Visit (INDEPENDENT_AMBULATORY_CARE_PROVIDER_SITE_OTHER): Payer: Medicare Other | Admitting: General Surgery

## 2018-09-24 ENCOUNTER — Encounter: Payer: Self-pay | Admitting: General Surgery

## 2018-09-24 VITALS — BP 130/74 | HR 78 | Temp 97.3°F | Ht 66.0 in | Wt 212.0 lb

## 2018-09-24 DIAGNOSIS — C50312 Malignant neoplasm of lower-inner quadrant of left female breast: Secondary | ICD-10-CM

## 2018-09-24 NOTE — Patient Instructions (Addendum)
Return in two weeks. The patient is aware to call back for any questions or concerns.  

## 2018-09-24 NOTE — Progress Notes (Signed)
Patient ID: Tammy [name], female   DOB: Oct 14, 1945, 73 y.o.   MRN: [id number]  Chief Complaint  Patient presents with  . Follow-up    HPI Tammy [name] is a 73 y.o. female here today for her post op left breast wide excision done on 08/26/2018. Patient states doing well.  HPI  Past Medical History:  Diagnosis Date  . Asthma   . Benign neoplasm of colon   . Cervical disc disease    "bulging" - no limitations per pt  . Chronic diastolic CHF (congestive heart failure) ([place])    a. 07/2012 Echo: Nl EF. mild inferoseptal HK, Gr 2 DD, mild conc LVH, mild PR/TR, mild PAH.  . CKD (chronic kidney disease), stage III ([place])   . COPD (chronic obstructive pulmonary disease) ([place])   . Coronary artery disease    a. 11/2011 Cath: LAD 44md, LCX min irregs, RCA 70p, 1026mith L->R collats, EF 60%-->Med Rx.  . Diabetes mellitus without complication ([place]CM[place]  . Fusion of lumbar spine 12/22/2015  . GERD (gastroesophageal reflux disease)   . History of DVT (deep vein thrombosis)   . History of tobacco abuse    a. Quit 2011.  . Marland Kitchenyperlipidemia   . Hypertension   . Hypothyroidism   . Obesity   . Palpitations   . PE (pulmonary embolism) 10/15  . PONV (postoperative nausea and vomiting)   . Pulmonary embolus ([place]CS[place]10/15/2015   RLL. Presented with SOB and elevated d-dimer.   . Marland KitchenVD (peripheral vascular disease) (HCK[place]   a. PTA of right leg and left femoral artery with stenosis.  . Renal insufficiency   . [facility]   TIAs - no deficits    Past Surgical History:  Procedure Laterality Date  . ANGIOPLASTY / STENTING FEMORAL     PVD; angioplasty right leg and left femoral artery with stenosis.   . APPENDECTOMY  Oct. 2016  . BACK SURGERY  03/2012  . BACK SURGERY     screws, rods replaced with new hardware.  . Marland KitchenACK SURGERY  11/2016  . CARDIAC CATHETERIZATION  12/07/2011   Mid LAD 50%, distal LAD 50%, mid RCA 70%, distal RCA 100%   . CARDIAC CATHETERIZATION  01/04/2011   100% occluded mid RCA with  good collaterals from distal LAD, normal LVEF.  . Marland KitchenHOLECYSTECTOMY    . COLONOSCOPY  2013  . COLONOSCOPY WITH PROPOFOL N/A 05/24/2015   Procedure: COLONOSCOPY WITH PROPOFOL;  Surgeon: DaLucilla LameMD;  [place];  Laterality: N/A;  Diabetic - oral meds  . LAPAROSCOPIC APPENDECTOMY N/A 01/06/2015   Procedure: APPENDECTOMY LAPAROSCOPIC;  Surgeon: SeChristene LyeMD;  Location: [facility] ORS;  Service: General;  Laterality: N/A;  . MASTECTOMY W/ SENTINEL NODE BIOPSY Left 08/26/2018   Procedure: PARTIAL MASTECTOMY WIDE EXCISION WITH SENTINEL LYMPH NODE BIOPSY LEFT;  Surgeon: ByRobert BellowMD;  Location: [facility] ORS;  Service: General;  Laterality: Left;  . POLYPECTOMY  05/24/2015   Procedure: POLYPECTOMY;  Surgeon: DaLucilla LameMD;  [place] Service: Endoscopy;;  . THYROIDECTOMY    . TRIGGER FINGER RELEASE      Family History  Problem Relation Age of Onset  . Hypertension Father   . Heart disease Father   . Heart attack Father   . Diabetes Father   . Cancer Mother        colon  . Heart attack Brother   . Diabetes Brother   . Hypertension  Brother   . Heart disease Brother   . Heart attack Brother   . Diabetes Brother   . Hypertension Brother   . Heart disease Brother   . Heart attack Brother   . Diabetes Brother   . Hypertension Brother   . Heart disease Brother   . Cancer Sister        lung  . COPD Sister   . COPD Sister   . Breast cancer Neg Hx     Social History Social History   Tobacco Use  . Smoking status: Former Smoker    Packs/day: 0.50    Years: 15.00    Pack years: 7.50    Types: Cigarettes    Quit date: 03/13/2009    Years since quitting: 9.5  . Smokeless tobacco: Never Used  Substance Use Topics  . Alcohol use: No    Alcohol/week: 0.0 standard drinks  . Drug use: No    Allergies  Allergen Reactions  . Hydrocodone Other (See Comments)    Unresponsive  . Aleve [Naproxen Sodium]      Hives & itching   . Contrast Media [Iodinated Diagnostic Agents]     Pt avoids due to kidney issues  . Ioxaglate Other (See Comments)    (iodine) Pt avoids due to kidney issues   . Oxycodone Other (See Comments)    hallucinations  . Ranexa [Ranolazine]     Sick on stomach  . Sulfa Antibiotics Other (See Comments)    Pt avoids due to kidney issues  . Tramadol Other (See Comments)    nightmares    Current Outpatient Medications  Medication Sig Dispense Refill  . albuterol (PROAIR HFA) 108 (90 Base) MCG/ACT inhaler INHALE 2 PUFFS EVERY 4 HOURS AS NEEDED FOR WHEEZING OR SHORTNESS OF BREATH 8.5 g 3  . apixaban (ELIQUIS) 2.5 MG TABS tablet Take 1 tablet (2.5 mg) by mouth twice daily 180 tablet 3  . blood glucose meter kit and supplies KIT Dispense based on patient and insurance preference. Use one time daily as directed, E11.9 1 each 0  . cefadroxil (DURICEF) 500 MG capsule Take 1 capsule (500 mg total) by mouth 2 (two) times daily. 14 capsule 1  . DULoxetine (CYMBALTA) 60 MG capsule Take 1 capsule (60 mg total) by mouth daily. 90 capsule 1  . gabapentin (NEURONTIN) 300 MG capsule Take 1 capsule (300 mg total) by mouth 3 (three) times daily. 270 capsule 3  . glipiZIDE (GLUCOTROL) 5 MG tablet TAKE ONE TABLET EVERY DAY BEFORE BREAKFAST 90 tablet 1  . isosorbide mononitrate (IMDUR) 60 MG 24 hr tablet Take 1 tablet (60 mg total) by mouth as directed. Take 1 tablet at bedtime and 1/2 tablet in the morning (Patient taking differently: Take 30-60 mg by mouth See admin instructions. 30 mg in the morning, and 60 mg at bedtime) 135 tablet 3  . levothyroxine (SYNTHROID) 150 MCG tablet Take 1 tablet (150 mcg total) by mouth daily before breakfast. 30 tablet 3  . losartan (COZAAR) 25 MG tablet Take 25 mg by mouth daily.    . metoprolol succinate (TOPROL-XL) 50 MG 24 hr tablet TAKE 1 TABLET BY MOUTH DAILY WITH OR IMMEDIATLEY FOLLOWING A MEAL (Patient taking differently: Take 50 mg by mouth daily. TAKE 1  TABLET BY MOUTH DAILY WITH OR IMMEDIATLEY FOLLOWING A MEAL) 90 tablet 3  . montelukast (SINGULAIR) 10 MG tablet Take 1 tablet (10 mg total) by mouth at bedtime. 30 tablet 3  . nitroGLYCERIN (NITROSTAT) 0.4 MG SL  tablet Place 1 tablet (0.4 mg total) under the tongue every 5 (five) minutes as needed for chest pain. 25 tablet 6  . pantoprazole (PROTONIX) 40 MG tablet Take 1 tablet (40 mg total) by mouth daily as needed. (Patient taking differently: Take 40 mg by mouth daily. ) 90 tablet 1  . penicillin v potassium (VEETID) 500 MG tablet Take 1 tablet (500 mg total) by mouth 4 (four) times daily. 40 tablet 1  . pregabalin (LYRICA) 75 MG capsule 1 QHS x1 week, then increase to 1 BID for 1 week, if not too sleepy 2qHS and 1 during the day for 1 week, then 2 BID (Patient taking differently: Take 150 mg by mouth at bedtime. ) 120 capsule 3  . psyllium (METAMUCIL) 58.6 % packet Take 1 packet by mouth daily as needed (fiber).     . rosuvastatin (CRESTOR) 40 MG tablet TAKE 1 TABLET BY MOUTH DAILY 90 tablet 1  . trimethoprim (TRIMPEX) 100 MG tablet Take 1 tablet (100 mg total) by mouth daily. 30 tablet 6  . TRULICITY 1.5 KC/0.0LK SOPN INJECT 1.5MG INTO THE SKIN ONCE A WEEK (Patient taking differently: Inject 1.5 mg into the skin every Tuesday. ) 4 mL 0   No current facility-administered medications for this visit.     Review of Systems Review of Systems  Constitutional: Negative.   Respiratory: Negative.   Cardiovascular: Negative.     Blood pressure 130/74, pulse 78, temperature (!) 97.3 F (36.3 C), temperature source Skin, height 5' 6" (1.676 m), weight 212 lb (96.2 kg), SpO2 97 %.  Physical Exam Physical Exam Exam conducted with a chaperone present.  Chest:       Data Reviewed Prior cultures had suggested a facultative anaerobe and she was prescribed penicillin which she has tolerated without ill effect.  Assessment Resolution of inflammatory process in the seroma post wide  excision.  Plan  Opportunity to meet with medical oncology was discussed and acceptable to the patient.  While her brother has been followed by Dr. Janese Banks, she would like to see 1 of the other physicians in the group.  She will complete her present course of antibiotics and plan for a follow-up in 2 weeks.   The patient is aware to call back for any questions or concerns.    HPI, Physical Exam, Assessment and Plan have been scribed under the direction and in the presence of Hervey Ard, MD.  Gaspar Cola, CMA  I have completed the exam and reviewed the above documentation for accuracy and completeness.  I agree with the above.  Haematologist has been used and any errors in dictation or transcription are unintentional.  Hervey Ard, M.D., F.A.C.S.  Forest Gleason Victoriana Aziz 09/24/2018, 11:16 AM

## 2018-10-01 ENCOUNTER — Ambulatory Visit (INDEPENDENT_AMBULATORY_CARE_PROVIDER_SITE_OTHER): Payer: Medicare Other | Admitting: Family Medicine

## 2018-10-01 ENCOUNTER — Encounter: Payer: Self-pay | Admitting: Family Medicine

## 2018-10-01 ENCOUNTER — Other Ambulatory Visit: Payer: Self-pay

## 2018-10-01 VITALS — BP 114/74 | HR 69 | Temp 97.9°F | Ht 66.0 in | Wt 212.0 lb

## 2018-10-01 DIAGNOSIS — E1165 Type 2 diabetes mellitus with hyperglycemia: Secondary | ICD-10-CM | POA: Diagnosis not present

## 2018-10-01 DIAGNOSIS — E118 Type 2 diabetes mellitus with unspecified complications: Secondary | ICD-10-CM | POA: Diagnosis not present

## 2018-10-01 DIAGNOSIS — I209 Angina pectoris, unspecified: Secondary | ICD-10-CM

## 2018-10-01 DIAGNOSIS — M48061 Spinal stenosis, lumbar region without neurogenic claudication: Secondary | ICD-10-CM

## 2018-10-01 DIAGNOSIS — IMO0002 Reserved for concepts with insufficient information to code with codable children: Secondary | ICD-10-CM

## 2018-10-01 LAB — BAYER DCA HB A1C WAIVED: HB A1C (BAYER DCA - WAIVED): 8 % — ABNORMAL HIGH (ref <7.0)

## 2018-10-01 MED ORDER — LEVOTHYROXINE SODIUM 150 MCG PO TABS: 150.0000 ug | ORAL_TABLET | Freq: Every day | ORAL | 3 refills | Status: DC

## 2018-10-01 MED ORDER — PREGABALIN 75 MG PO CAPS: ORAL_CAPSULE | ORAL | 3 refills | Status: DC

## 2018-10-01 MED ORDER — TRULICITY 1.5 MG/0.5ML ~~LOC~~ SOAJ: 1.5000 mg | SUBCUTANEOUS | 6 refills | Status: DC

## 2018-10-01 NOTE — Progress Notes (Signed)
BP 114/74   Pulse 69   Temp 97.9 F (36.6 C) (Oral)   Ht 5\' 6"  (1.676 m)   Wt 212 lb (96.2 kg)   SpO2 96%   BMI 34.22 kg/m    Subjective:    Patient ID: Tammy [name], female    DOB: Dec 11, 1945, 73 y.o.   MRN: [id number]  HPI: Tammy [name] is a 73 y.o. female  Chief Complaint  Patient presents with  . Diabetes  . Back Pain   DIABETES- has been up and down since having her breast surgery, has been under 200, but up and down Hypoglycemic episodes:no Polydipsia/polyuria: no Visual disturbance: yes- has been seeing double Chest pain: no Paresthesias: no Glucose Monitoring: yes  Accucheck frequency: Daily Taking Insulin?: no Blood Pressure Monitoring: not checking Retinal Examination: Up to Date Foot Exam: Up to Date Diabetic Education: Completed Pneumovax: Up to Date Influenza: Up to Date Aspirin: no  Has been seeing double and has been having some word finding- getting a brain scan next week.   Lyrica seems like it has been taking 150mg  at bed time- still working on titrating up. Feeling better at night. Feeling better on it, but not feeling great during the day, still feeling very tired and not quite like herself. Staying tired most of the time. She feels like she is happy sitting in a chair, hurts when she get up. She is otherwise doing well with no other concerns or complaints at this time.   Relevant past medical, surgical, family and social history reviewed and updated as indicated. Interim medical history since our last visit reviewed. Allergies and medications reviewed and updated.  Review of Systems  Constitutional: Positive for fatigue. Negative for activity change, appetite change, chills, diaphoresis, fever and unexpected weight change.  Respiratory: Negative.   Cardiovascular: Negative.   Musculoskeletal: Positive for back pain, gait problem and myalgias. Negative for arthralgias, joint swelling, neck pain and neck stiffness.  Skin: Negative.   Neurological:  Positive for weakness. Negative for dizziness, tremors, seizures, syncope, facial asymmetry, speech difficulty, light-headedness, numbness and headaches.  Psychiatric/Behavioral: Negative.     Per HPI unless specifically indicated above     Objective:    BP 114/74   Pulse 69   Temp 97.9 F (36.6 C) (Oral)   Ht 5\' 6"  (1.676 m)   Wt 212 lb (96.2 kg)   SpO2 96%   BMI 34.22 kg/m   Wt Readings from Last 3 Encounters:  10/01/18 212 lb (96.2 kg)  09/24/18 212 lb (96.2 kg)  09/19/18 212 lb (96.2 kg)    Physical Exam Vitals signs and nursing note reviewed.  Constitutional:      General: She is not in acute distress.    Appearance: Normal appearance. She is not ill-appearing, toxic-appearing or diaphoretic.  HENT:     Head: Normocephalic and atraumatic.     Right Ear: External ear normal.     Left Ear: External ear normal.     Nose: Nose normal.     Mouth/Throat:     Mouth: Mucous membranes are moist.     Pharynx: Oropharynx is clear.  Eyes:     General: No scleral icterus.       Right eye: No discharge.        Left eye: No discharge.     Extraocular Movements: Extraocular movements intact.     Conjunctiva/sclera: Conjunctivae normal.     Pupils: Pupils are equal, round, and reactive to light.  Neck:  Musculoskeletal: Normal range of motion and neck supple.  Cardiovascular:     Rate and Rhythm: Normal rate and regular rhythm.     Pulses: Normal pulses.     Heart sounds: Normal heart sounds. No murmur. No friction rub. No gallop.   Pulmonary:     Effort: Pulmonary effort is normal. No respiratory distress.     Breath sounds: Normal breath sounds. No stridor. No wheezing, rhonchi or rales.  Chest:     Chest wall: No tenderness.  Musculoskeletal: Normal range of motion.  Skin:    General: Skin is warm and dry.     Capillary Refill: Capillary refill takes less than 2 seconds.     Coloration: Skin is not jaundiced or pale.     Findings: No bruising, erythema, lesion or  rash.  Neurological:     General: No focal deficit present.     Mental Status: She is alert and oriented to person, place, and time. Mental status is at baseline.  Psychiatric:        Mood and Affect: Mood normal.        Behavior: Behavior normal.        Thought Content: Thought content normal.        Judgment: Judgment normal.     Results for orders placed or performed in visit on 09/12/18  Anaerobic and Aerobic Culture   Specimen: Breast   BREAST LEFT BREAST 8''  Result Value Ref Range   Anaerobic Culture Final report (A)    Result 1 Comment (A)    Aerobic Culture Final report    Result 1 Mixed skin flora       Assessment & Plan:   Problem List Items Addressed This Visit      Endocrine   Uncontrolled type 2 diabetes mellitus with complication, without long-term current use of insulin ([place]) - Primary    Up slightly at 8.0. Will continue current regimen due to stress of surgery. Continue to monitor. Call with any concerns.       Relevant Medications   Dulaglutide (TRULICITY) 1.5 ME/2.6ST SOPN   Other Relevant Orders   Bayer DCA Hb A1c Waived   Referral to Chronic Care Management Services     Other   Spinal stenosis of lumbar region    Will continue to titrate up her lyrica. Call if she's getting too sleepy. Continue to monitor. Call with any concerns.           Follow up plan: Return in about 3 months (around 01/01/2019) for follow up DM.

## 2018-10-01 NOTE — Assessment & Plan Note (Signed)
Will continue to titrate up her lyrica. Call if she's getting too sleepy. Continue to monitor. Call with any concerns.

## 2018-10-01 NOTE — Assessment & Plan Note (Signed)
Up slightly at 8.0. Will continue current regimen due to stress of surgery. Continue to monitor. Call with any concerns.

## 2018-10-02 ENCOUNTER — Other Ambulatory Visit: Payer: Self-pay

## 2018-10-02 ENCOUNTER — Encounter: Payer: Self-pay | Admitting: Radiation Oncology

## 2018-10-02 ENCOUNTER — Encounter: Payer: Self-pay | Admitting: Internal Medicine

## 2018-10-02 ENCOUNTER — Inpatient Hospital Stay: Payer: Medicare Other | Attending: Internal Medicine | Admitting: Internal Medicine

## 2018-10-02 ENCOUNTER — Ambulatory Visit
Admission: RE | Admit: 2018-10-02 | Discharge: 2018-10-02 | Disposition: A | Discharge status: Home / Self Care | Payer: Medicare Other | Source: Ambulatory Visit | Attending: Radiation Oncology | Admitting: Radiation Oncology

## 2018-10-02 VITALS — BP 108/45 | HR 82 | Temp 98.1°F | Ht 66.0 in | Wt 212.0 lb

## 2018-10-02 VITALS — BP 108/45 | HR 82 | Temp 98.1°F | Resp 18

## 2018-10-02 DIAGNOSIS — J449 Chronic obstructive pulmonary disease, unspecified: Secondary | ICD-10-CM | POA: Diagnosis not present

## 2018-10-02 DIAGNOSIS — Z170 Estrogen receptor positive status [ER+]: Secondary | ICD-10-CM | POA: Diagnosis not present

## 2018-10-02 DIAGNOSIS — N189 Chronic kidney disease, unspecified: Secondary | ICD-10-CM | POA: Diagnosis not present

## 2018-10-02 DIAGNOSIS — I2510 Atherosclerotic heart disease of native coronary artery without angina pectoris: Secondary | ICD-10-CM | POA: Insufficient documentation

## 2018-10-02 DIAGNOSIS — I129 Hypertensive chronic kidney disease with stage 1 through stage 4 chronic kidney disease, or unspecified chronic kidney disease: Secondary | ICD-10-CM | POA: Insufficient documentation

## 2018-10-02 DIAGNOSIS — Z87891 Personal history of nicotine dependence: Secondary | ICD-10-CM | POA: Diagnosis not present

## 2018-10-02 DIAGNOSIS — J45909 Unspecified asthma, uncomplicated: Secondary | ICD-10-CM | POA: Insufficient documentation

## 2018-10-02 DIAGNOSIS — Z79899 Other long term (current) drug therapy: Secondary | ICD-10-CM | POA: Diagnosis not present

## 2018-10-02 DIAGNOSIS — Z86711 Personal history of pulmonary embolism: Secondary | ICD-10-CM | POA: Insufficient documentation

## 2018-10-02 DIAGNOSIS — Z809 Family history of malignant neoplasm, unspecified: Secondary | ICD-10-CM | POA: Insufficient documentation

## 2018-10-02 DIAGNOSIS — C50312 Malignant neoplasm of lower-inner quadrant of left female breast: Secondary | ICD-10-CM | POA: Insufficient documentation

## 2018-10-02 DIAGNOSIS — E669 Obesity, unspecified: Secondary | ICD-10-CM | POA: Diagnosis not present

## 2018-10-02 DIAGNOSIS — Z7901 Long term (current) use of anticoagulants: Secondary | ICD-10-CM | POA: Diagnosis not present

## 2018-10-02 DIAGNOSIS — Z801 Family history of malignant neoplasm of trachea, bronchus and lung: Secondary | ICD-10-CM | POA: Insufficient documentation

## 2018-10-02 DIAGNOSIS — Z86718 Personal history of other venous thrombosis and embolism: Secondary | ICD-10-CM | POA: Insufficient documentation

## 2018-10-02 DIAGNOSIS — E039 Hypothyroidism, unspecified: Secondary | ICD-10-CM | POA: Diagnosis not present

## 2018-10-02 DIAGNOSIS — I739 Peripheral vascular disease, unspecified: Secondary | ICD-10-CM | POA: Diagnosis not present

## 2018-10-02 DIAGNOSIS — E785 Hyperlipidemia, unspecified: Secondary | ICD-10-CM | POA: Insufficient documentation

## 2018-10-02 DIAGNOSIS — E119 Type 2 diabetes mellitus without complications: Secondary | ICD-10-CM | POA: Diagnosis not present

## 2018-10-02 DIAGNOSIS — I5032 Chronic diastolic (congestive) heart failure: Secondary | ICD-10-CM | POA: Diagnosis not present

## 2018-10-02 NOTE — Assessment & Plan Note (Signed)
#    #   follow up in 2 months- MD-labs- cbc/cmp-Dr.B

## 2018-10-02 NOTE — Consult Note (Signed)
NEW PATIENT EVALUATION  Name: Tammy [name]  MRN: [id number]  Date:   10/02/2018     DOB: 07-23-1945   This 73 y.o. female patient presents to the clinic for initial evaluation of stage I (T1 cN0 M0) ER PR positive invasive mammary carcinoma of the.  Left breast status post wide local excision and sentinel node biopsy  REFERRING PHYSICIAN: [name], DO  CHIEF COMPLAINT:  Chief Complaint  Patient presents with  . Breast Cancer    Initial Eval    DIAGNOSIS: The encounter diagnosis was Malignant neoplasm of lower-inner quadrant of left female breast, unspecified estrogen receptor status ([place]).   PREVIOUS INVESTIGATIONS:  Pathology report reviewed Clinical notes reviewed Mammogram and ultrasound reviewed  HPI: Patient is a 73 year old female who presented with an abnormal mammogram showing a highly suspicious mass in the left breast at the 8 o'clock position 5 cm from the nipple measuring 1.2 cm.  Ultrasound confirmed the lesion and she underwent an ultrasound-guided biopsy positive for invasive mammary carcinoma.  This was followed by a wide local excision and sentinel node biopsy.  Tumor measured 1.4 cm was overall grade 2 invasive mammary carcinoma.  She also had expansile comedonecrosis ductal carcinoma in situ.  Margins were clear at 1.1 cm for the invasive carcinoma and 2.2 cm for the ductal carcinoma in situ.  There were 3 sentinel nodes examined all negative for malignancy.  Lymphovascular invasion was not identified.  Tumor again was ER PR positive HER-2/neu not overexpressed.  She has been seen by medical oncology today not thought to be a candidate for systemic chemotherapy.  She is doing well she specifically denies breast tenderness cough or bone pain.  PLANNED TREATMENT REGIMEN: Left whole breast radiation hypofractionated  PAST MEDICAL HISTORY:  has a past medical history of Asthma, Benign neoplasm of colon, Cervical disc disease, Chronic diastolic CHF (congestive heart  failure) ([place]), CKD (chronic kidney disease), stage III ([place]), COPD (chronic obstructive pulmonary disease) ([place]), Coronary artery disease, Diabetes mellitus without complication ([place]), Fusion of lumbar spine (12/22/2015), GERD (gastroesophageal reflux disease), History of DVT (deep vein thrombosis), History of tobacco abuse, Hyperlipidemia, Hypertension, Hypothyroidism, Obesity, Palpitations, PE (pulmonary embolism) (10/15), PONV (postoperative nausea and vomiting), Pulmonary embolus ([place]) (12/25/2013), PVD (peripheral vascular disease) ([place]), Renal insufficiency, and Stroke ([place]) (2015).    PAST SURGICAL HISTORY:  Past Surgical History:  Procedure Laterality Date  . ANGIOPLASTY / STENTING FEMORAL     PVD; angioplasty right leg and left femoral artery with stenosis.   . APPENDECTOMY  Oct. 2016  . BACK SURGERY  03/2012  . BACK SURGERY     screws, rods replaced with new hardware.  [name] BACK SURGERY  11/2016  . CARDIAC CATHETERIZATION  12/07/2011   Mid LAD 50%, distal LAD 50%, mid RCA 70%, distal RCA 100%   . CARDIAC CATHETERIZATION  01/04/2011   100% occluded mid RCA with good collaterals from distal LAD, normal LVEF.  [name] CHOLECYSTECTOMY    . COLONOSCOPY  2013  . COLONOSCOPY WITH PROPOFOL N/A 05/24/2015   Procedure: COLONOSCOPY WITH PROPOFOL;  Surgeon: [name], MD;  Location: [facility];  Service: Endoscopy;  Laterality: N/A;  Diabetic - oral meds  . LAPAROSCOPIC APPENDECTOMY N/A 01/06/2015   Procedure: APPENDECTOMY LAPAROSCOPIC;  Surgeon: [name], MD;  Location: [facility];  Service: General;  Laterality: N/A;  . MASTECTOMY W/ SENTINEL NODE BIOPSY Left 08/26/2018   Procedure: PARTIAL MASTECTOMY WIDE EXCISION WITH SENTINEL LYMPH NODE BIOPSY LEFT;  Surgeon: Hervey Ard  W, MD;  Location: [facility];  Service: General;  Laterality: Left;  . POLYPECTOMY  05/24/2015   Procedure: POLYPECTOMY;  Surgeon: [name], MD;  Location: [facility];  Service: Endoscopy;;  .  THYROIDECTOMY    . TRIGGER FINGER RELEASE      FAMILY HISTORY: family history includes COPD in her sister and sister; Cancer in her mother and sister; Diabetes in her brother, brother, brother, and father; Heart attack in her brother, brother, brother, and father; Heart disease in her brother, brother, brother, and father; Hypertension in her brother, brother, brother, and father.  SOCIAL HISTORY:  reports that she quit smoking about 9 years ago. Her smoking use included cigarettes. She has a 7.50 pack-year smoking history. She has never used smokeless tobacco. She reports that she does not drink alcohol or use drugs.  ALLERGIES: Hydrocodone, Aleve [naproxen sodium], Contrast media [iodinated diagnostic agents], Ioxaglate, Oxycodone, Ranexa [ranolazine], Sulfa antibiotics, and Tramadol  MEDICATIONS:  Current Outpatient Medications  Medication Sig Dispense Refill  . albuterol (PROAIR HFA) 108 (90 Base) MCG/ACT inhaler INHALE 2 PUFFS EVERY 4 HOURS AS NEEDED FOR WHEEZING OR SHORTNESS OF BREATH 8.5 g 3  . apixaban (ELIQUIS) 2.5 MG TABS tablet Take 1 tablet (2.5 mg) by mouth twice daily 180 tablet 3  . blood glucose meter kit and supplies KIT Dispense based on patient and insurance preference. Use one time daily as directed, E11.9 1 each 0  . Dulaglutide (TRULICITY) 1.5 YQ/6.5HQ SOPN Inject 1.5 mg into the skin every Tuesday. 4 pen 6  . DULoxetine (CYMBALTA) 60 MG capsule Take 1 capsule (60 mg total) by mouth daily. 90 capsule 1  . gabapentin (NEURONTIN) 300 MG capsule Take 1 capsule (300 mg total) by mouth 3 (three) times daily. 270 capsule 3  . glipiZIDE (GLUCOTROL) 5 MG tablet TAKE ONE TABLET EVERY DAY BEFORE BREAKFAST 90 tablet 1  . isosorbide mononitrate (IMDUR) 60 MG 24 hr tablet Take 1 tablet (60 mg total) by mouth as directed. Take 1 tablet at bedtime and 1/2 tablet in the morning (Patient taking differently: Take 30-60 mg by mouth See admin instructions. 30 mg in the morning, and 60 mg at  bedtime) 135 tablet 3  . levothyroxine (SYNTHROID) 150 MCG tablet Take 1 tablet (150 mcg total) by mouth daily before breakfast. 90 tablet 3  . losartan (COZAAR) 25 MG tablet Take 25 mg by mouth daily.    . metoprolol succinate (TOPROL-XL) 50 MG 24 hr tablet TAKE 1 TABLET BY MOUTH DAILY WITH OR IMMEDIATLEY FOLLOWING A MEAL (Patient taking differently: Take 50 mg by mouth daily. TAKE 1 TABLET BY MOUTH DAILY WITH OR IMMEDIATLEY FOLLOWING A MEAL) 90 tablet 3  . nitroGLYCERIN (NITROSTAT) 0.4 MG SL tablet Place 1 tablet (0.4 mg total) under the tongue every 5 (five) minutes as needed for chest pain. 25 tablet 6  . pantoprazole (PROTONIX) 40 MG tablet Take 1 tablet (40 mg total) by mouth daily as needed. (Patient taking differently: Take 40 mg by mouth daily. ) 90 tablet 1  . penicillin v potassium (VEETID) 500 MG tablet Take 1 tablet (500 mg total) by mouth 4 (four) times daily. 40 tablet 1  . pregabalin (LYRICA) 75 MG capsule 1 QHS x1 week, then increase to 1 BID for 1 week, if not too sleepy 2qHS and 1 during the day for 1 week, then 2 BID 120 capsule 3  . psyllium (METAMUCIL) 58.6 % packet Take 1 packet by mouth daily as needed (fiber).     [name]  rosuvastatin (CRESTOR) 40 MG tablet TAKE 1 TABLET BY MOUTH DAILY 90 tablet 1  . trimethoprim (TRIMPEX) 100 MG tablet Take 1 tablet (100 mg total) by mouth daily. 30 tablet 6   No current facility-administered medications for this encounter.     ECOG PERFORMANCE STATUS:  0 - Asymptomatic  REVIEW OF SYSTEMS: Patient denies any weight loss, fatigue, weakness, fever, chills or night sweats. Patient denies any loss of vision, blurred vision. Patient denies any ringing  of the ears or hearing loss. No irregular heartbeat. Patient denies heart murmur or history of fainting. Patient denies any chest pain or pain radiating to her upper extremities. Patient denies any shortness of breath, difficulty breathing at night, cough or hemoptysis. Patient denies any swelling in  the lower legs. Patient denies any nausea vomiting, vomiting of blood, or coffee ground material in the vomitus. Patient denies any stomach pain. Patient states has had normal bowel movements no significant constipation or diarrhea. Patient denies any dysuria, hematuria or significant nocturia. Patient denies any problems walking, swelling in the joints or loss of balance. Patient denies any skin changes, loss of hair or loss of weight. Patient denies any excessive worrying or anxiety or significant depression. Patient denies any problems with insomnia. Patient denies excessive thirst, polyuria, polydipsia. Patient denies any swollen glands, patient denies easy bruising or easy bleeding. Patient denies any recent infections, allergies or URI. Patient "s visual fields have not changed significantly in recent time.   PHYSICAL EXAM: BP (!) 108/45   Pulse 82   Temp 98.1 F (36.7 C)   Resp 18  Left breast is wide local excision scar which is well-healed no dominant mass or nodularity is noted in either breast in 2 positions examined.  No axillary or supraclavicular adenopathy is identified.  Well-developed well-nourished patient in NAD. HEENT reveals PERLA, EOMI, discs not visualized.  Oral cavity is clear. No oral mucosal lesions are identified. Neck is clear without evidence of cervical or supraclavicular adenopathy. Lungs are clear to A&P. Cardiac examination is essentially unremarkable with regular rate and rhythm without murmur rub or thrill. Abdomen is benign with no organomegaly or masses noted. Motor sensory and DTR levels are equal and symmetric in the upper and lower extremities. Cranial nerves II through XII are grossly intact. Proprioception is intact. No peripheral adenopathy or edema is identified. No motor or sensory levels are noted. Crude visual fields are within normal range.  LABORATORY DATA: Pathology report reviewed    RADIOLOGY RESULTS: Mammogram and ultrasound reviewed   IMPRESSION:  Stage I ER PR positive invasive mammary carcinoma of the left breast status post wide local excision in 73 year old female  PLAN: Present time elected to go ahead with hypofractionated radiation therapy to her left breast over 3 weeks.  Would also boost her scar another 1400 cGy using electron-beam.  Risks and benefits of treatment including skin reaction fatigue alteration of blood counts possible occlusion of superficial lung all were described in detail to the patient.  I have personally set up and ordered CT simulation for early next week.  Patient also will be a candidate for antiestrogen therapy after completion of radiation.  Patient comprehends my treatment plan well.  I would like to take this opportunity to thank you for allowing me to participate in the care of your patient.[name], MD

## 2018-10-02 NOTE — Progress Notes (Signed)
[name] [place] CONSULT NOTE  Patient Care Team: [name], DO as PCP - General (Family Medicine) [name], [name], MD as Consulting Physician (Cardiology) [name], MD as Consulting Physician (Urology) [name], MD as Consulting Physician (Nephrology) [name], MD as Referring Physician (Orthopedic Surgery) [name], MD as Attending Physician (Endocrinology) [name], MD as Referring Physician (Vascular Surgery)  CHIEF COMPLAINTS/PURPOSE OF CONSULTATION: Breast cancer  #  Oncology History Overview Note   # July 2020 Left Breast- IMC; 14 mm; Grade: 2; Lymphovascular Invasion: Not identified;  pT1c pN0(sn); ER positive, PR positive, HER2 negative. [s/p lumpec; Dr.Byrnett]; recommend radiation followed by AI  # CKD-III/COPD/ CHF-compesated/PAD; CAD  DIAGNOSIS: Left breast cancer  STAGE: 1        ;GOALS: Cure  CURRENT/MOST RECENT THERAPY: Radiation   Malignant neoplasm of lower-inner quadrant of left female breast ([place]) (Resolved)  08/14/2018 Initial Diagnosis   Malignant neoplasm of lower-inner quadrant of left female breast (HCC)      HISTORY OF PRESENTING ILLNESS:  MYIAH PETKUS 73 y.o.  female female with no prior history of breast cancer/or malignancies has been referred to [place] for further evaluation recommendations for new diagnosis of breast cancer.  Patient noted to have left breast pain which led to diagnostic mammogram/ultrasound.  Patient subsequently was evaluated by [name]; and had lumpectomy.  Patient is recuperating well from surgery.  Denies any major postoperative complications.  Review of Systems  Constitutional: Negative for chills, diaphoresis, fever, malaise/fatigue and weight loss.  HENT: Negative for nosebleeds and sore throat.   Eyes: Negative for double vision.  Respiratory: Negative for cough, hemoptysis, sputum production, shortness of breath and wheezing.   Cardiovascular: Negative for chest  pain, palpitations, orthopnea and leg swelling.  Gastrointestinal: Negative for abdominal pain, blood in stool, constipation, diarrhea, heartburn, melena, nausea and vomiting.  Genitourinary: Negative for dysuria, frequency and urgency.  Musculoskeletal: Negative for back pain and joint pain.  Skin: Negative.  Negative for itching and rash.  Neurological: Negative for dizziness, tingling, focal weakness, weakness and headaches.  Endo/Heme/Allergies: Does not bruise/bleed easily.  Psychiatric/Behavioral: Negative for depression. The patient is not nervous/anxious and does not have insomnia.      MEDICAL HISTORY:  Past Medical History:  Diagnosis Date  . Asthma   . Benign neoplasm of colon   . Cervical disc disease    "bulging" - no limitations per pt  . Chronic diastolic CHF (congestive heart failure) ([place])    a. 07/2012 Echo: Nl EF. mild inferoseptal HK, Gr 2 DD, mild conc LVH, mild PR/TR, mild PAH.  . CKD (chronic kidney disease), stage III ([place])   . COPD (chronic obstructive pulmonary disease) ([place])   . Coronary artery disease    a. 11/2011 Cath: LAD 82md, LCX min irregs, RCA 70p, 1050mith L->R collats, EF 60%-->Med Rx.  . Diabetes mellitus without complication ([place]  . Fusion of lumbar spine 12/22/2015  . GERD (gastroesophageal reflux disease)   . History of DVT (deep vein thrombosis)   . History of tobacco abuse    a. Quit 2011.  . Marland Kitchenyperlipidemia   . Hypertension   . Hypothyroidism   . Obesity   . Palpitations   . PE (pulmonary embolism) 10/15  . PONV (postoperative nausea and vomiting)   . [place]   RLL. Presented with SOB and elevated d-dimer.   . Marland KitchenVD (peripheral vascular disease) ([place]   a. PTA of right  leg and left femoral artery with stenosis.  . Renal insufficiency   . Stroke [facility]) 2015   TIAs - no deficits    SURGICAL HISTORY: Past Surgical History:  Procedure Laterality Date  . ANGIOPLASTY / STENTING FEMORAL     PVD;  angioplasty right leg and left femoral artery with stenosis.   . APPENDECTOMY  Oct. 2016  . BACK SURGERY  03/2012  . BACK SURGERY     screws, rods replaced with new hardware.  [name] BACK SURGERY  11/2016  . CARDIAC CATHETERIZATION  12/07/2011   Mid LAD 50%, distal LAD 50%, mid RCA 70%, distal RCA 100%   . CARDIAC CATHETERIZATION  01/04/2011   100% occluded mid RCA with good collaterals from distal LAD, normal LVEF.  [name] CHOLECYSTECTOMY    . COLONOSCOPY  2013  . COLONOSCOPY WITH PROPOFOL N/A 05/24/2015   Procedure: COLONOSCOPY WITH PROPOFOL;  Surgeon: [name], MD;  Location: [place];  Service: Endoscopy;  Laterality: N/A;  Diabetic - oral meds  . LAPAROSCOPIC APPENDECTOMY N/A 01/06/2015   Procedure: APPENDECTOMY LAPAROSCOPIC;  Surgeon: [name], MD;  Location: [facility];  Service: General;  Laterality: N/A;  . MASTECTOMY W/ SENTINEL NODE BIOPSY Left 08/26/2018   Procedure: PARTIAL MASTECTOMY WIDE EXCISION WITH SENTINEL LYMPH NODE BIOPSY LEFT;  Surgeon: [name], MD;  Location: [facility];  Service: General;  Laterality: Left;  . POLYPECTOMY  05/24/2015   Procedure: POLYPECTOMY;  Surgeon: [name], MD;  Location: [place];  Service: Endoscopy;;  . THYROIDECTOMY    . TRIGGER FINGER RELEASE      SOCIAL HISTORY: Social History   Socioeconomic History  . Marital status: Married    Spouse name: Not on file  . Number of children: 2  . Years of education: some college, Production assistant, radio   . Highest education level: Associate degree: occupational, Hotel manager, or vocational program  Occupational History  . Occupation: Retired  Scientific laboratory technician  . Financial resource strain: Not hard at all  . Food insecurity    Worry: Never true    Inability: Never true  . Transportation needs    Medical: No    Non-medical: No  Tobacco Use  . Smoking status: Former Smoker    Packs/day: 0.50    Years: 15.00    Pack years: 7.50    Types:  Cigarettes    Quit date: 03/13/2009    Years since quitting: 9.5  . Smokeless tobacco: Never Used  Substance and Sexual Activity  . Alcohol use: No    Alcohol/week: 0.0 standard drinks  . Drug use: No  . Sexual activity: Yes    Birth control/protection: Post-menopausal  Lifestyle  . Physical activity    Days per week: 0 days    Minutes per session: 0 min  . Stress: Not at all  Relationships  . Social connections    Talks on phone: More than three times a week    Gets together: More than three times a week    Attends religious service: 1 to 4 times per year    Active member of club or organization: No    Attends meetings of clubs or organizations: Never    Relationship status: Married  . Intimate partner violence    Fear of current or ex partner: No    Emotionally abused: No    Physically abused: No    Forced sexual activity: No  Other Topics Concern  . Not on file  Social  History Narrative   Lives at home with husband in [place]. Quit smoking 10 years ago; never alcohol. 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     FAMILY HISTORY: Family History  Problem Relation Age of Onset  . Hypertension Father   . Heart disease Father   . Heart attack Father   . Diabetes Father   . Cancer Mother        colon  . Heart attack Brother   . Diabetes Brother   . Hypertension Brother   . Heart disease Brother   . Heart attack Brother   . Diabetes Brother   . Hypertension Brother   . Heart disease Brother   . Heart attack Brother   . Diabetes Brother   . Hypertension Brother   . Heart disease Brother   . Cancer Sister        lung  . COPD Sister   . COPD Sister   . Breast cancer Neg Hx     ALLERGIES:  is allergic to hydrocodone; aleve [naproxen sodium]; contrast media [iodinated diagnostic agents]; ioxaglate; oxycodone; ranexa [ranolazine]; sulfa antibiotics; and tramadol.  MEDICATIONS:  Current Outpatient Medications  Medication Sig Dispense Refill  . albuterol (PROAIR  HFA) 108 (90 Base) MCG/ACT inhaler INHALE 2 PUFFS EVERY 4 HOURS AS NEEDED FOR WHEEZING OR SHORTNESS OF BREATH 8.5 g 3  . apixaban (ELIQUIS) 2.5 MG TABS tablet Take 1 tablet (2.5 mg) by mouth twice daily 180 tablet 3  . blood glucose meter kit and supplies KIT Dispense based on patient and insurance preference. Use one time daily as directed, E11.9 1 each 0  . Dulaglutide (TRULICITY) 1.5 TO/6.7TI SOPN Inject 1.5 mg into the skin every Tuesday. 4 pen 6  . DULoxetine (CYMBALTA) 60 MG capsule Take 1 capsule (60 mg total) by mouth daily. 90 capsule 1  . gabapentin (NEURONTIN) 300 MG capsule Take 1 capsule (300 mg total) by mouth 3 (three) times daily. 270 capsule 3  . glipiZIDE (GLUCOTROL) 5 MG tablet TAKE ONE TABLET EVERY DAY BEFORE BREAKFAST 90 tablet 1  . isosorbide mononitrate (IMDUR) 60 MG 24 hr tablet Take 1 tablet (60 mg total) by mouth as directed. Take 1 tablet at bedtime and 1/2 tablet in the morning (Patient taking differently: Take 30-60 mg by mouth See admin instructions. 30 mg in the morning, and 60 mg at bedtime) 135 tablet 3  . levothyroxine (SYNTHROID) 150 MCG tablet Take 1 tablet (150 mcg total) by mouth daily before breakfast. 90 tablet 3  . losartan (COZAAR) 25 MG tablet Take 25 mg by mouth daily.    . metoprolol succinate (TOPROL-XL) 50 MG 24 hr tablet TAKE 1 TABLET BY MOUTH DAILY WITH OR IMMEDIATLEY FOLLOWING A MEAL (Patient taking differently: Take 50 mg by mouth daily. TAKE 1 TABLET BY MOUTH DAILY WITH OR IMMEDIATLEY FOLLOWING A MEAL) 90 tablet 3  . nitroGLYCERIN (NITROSTAT) 0.4 MG SL tablet Place 1 tablet (0.4 mg total) under the tongue every 5 (five) minutes as needed for chest pain. 25 tablet 6  . pantoprazole (PROTONIX) 40 MG tablet Take 1 tablet (40 mg total) by mouth daily as needed. (Patient taking differently: Take 40 mg by mouth daily. ) 90 tablet 1  . penicillin v potassium (VEETID) 500 MG tablet Take 1 tablet (500 mg total) by mouth 4 (four) times daily. 40 tablet 1  .  pregabalin (LYRICA) 75 MG capsule 1 QHS x1 week, then increase to 1 BID for 1 week, if not too sleepy 2qHS and  1 during the day for 1 week, then 2 BID 120 capsule 3  . psyllium (METAMUCIL) 58.6 % packet Take 1 packet by mouth daily as needed (fiber).     . rosuvastatin (CRESTOR) 40 MG tablet TAKE 1 TABLET BY MOUTH DAILY 90 tablet 1  . trimethoprim (TRIMPEX) 100 MG tablet Take 1 tablet (100 mg total) by mouth daily. 30 tablet 6   No current facility-administered medications for this visit.       [name]  PHYSICAL EXAMINATION: ECOG PERFORMANCE STATUS: 1 - Symptomatic but completely ambulatory  Vitals:   10/02/18 1134  BP: (!) 108/45  Pulse: 82  Temp: 98.1 F (36.7 C)   Filed Weights   10/02/18 1134  Weight: 212 lb (96.2 kg)    Physical Exam  Constitutional: She is oriented to person, place, and time and well-developed, well-nourished, and in no distress.  HENT:  Head: Normocephalic and atraumatic.  Mouth/Throat: Oropharynx is clear and moist. No oropharyngeal exudate.  Eyes: Pupils are equal, round, and reactive to light.  Neck: Normal range of motion. Neck supple.  Cardiovascular: Normal rate and regular rhythm.  Pulmonary/Chest: No respiratory distress. She has no wheezes.  Abdominal: Soft. Bowel sounds are normal. She exhibits no distension and no mass. There is no abdominal tenderness. There is no rebound and no guarding.  Musculoskeletal: Normal range of motion.        General: No tenderness or edema.  Neurological: She is alert and oriented to person, place, and time.  Skin: Skin is warm.  Psychiatric: Affect normal.     LABORATORY DATA:  I have reviewed the data as listed Lab Results  Component Value Date   WBC 9.7 08/22/2018   HGB 13.3 08/22/2018   HCT 43.3 08/22/2018   MCV 90.8 08/22/2018   PLT 261 08/22/2018   Recent Labs    11/13/17 1539 03/11/18 1142 05/21/18 1355 08/22/18 0954  NA 139 138 138 141  K 4.8 5.2* 6.0* 4.7  CL 100 104 100 106  CO2 22 25  22 27   GLUCOSE 239* 186* 138* 159*  BUN 30* 26* 28* 24*  CREATININE 2.36* 1.36* 1.59* 1.40*  CALCIUM 9.2 8.9 9.3 8.6*  GFRNONAA 20* 38* 32* 37*  GFRAA 23* 44* 37* 42*  PROT 7.4  --  7.2  --   ALBUMIN 4.0  --  4.4  --   AST 14  --  17  --   ALT 5  --  11  --   ALKPHOS 97  --  75  --   BILITOT 0.3  --  0.5  --     RADIOGRAPHIC STUDIES: I have personally reviewed the radiological images as listed and agreed with the findings in the report. No results found.  ASSESSMENT & PLAN:   Malignant neoplasm of lower-inner quadrant of left female breast ([place]) #    # follow up in 2 months- MD-labs- cbc/cmp-Dr.B    Carcinoma of lower-inner quadrant of left breast in female, estrogen receptor positive ([place]) #Left breast stage I breast cancer ER PR positive HER-2 negative; status post lumpectomy.  MammaPrint low risk.   # I had a long discussion with the patient in general regarding the treatment options of breast cancer including-surgery; adjuvant radiation; role of adjuvant systemic therapy including-chemotherapy antihormone therapy.   #Given the low risk MammaPrint-patient overall has good prognosis/would not recommend any additional adjuvant chemotherapy.  Recommend radiation adjuvantly.  Patient has been evaluated by Dr. Donella Stade this morning.  #Patient will need  5 years of antihormone therapy /aromatase inhibitors.  I discussed the mechanism of action of aromatase inhibitors-with blocking of estrogen to prevent breast cancer.  Also discussed the potential side effects including but not limited to arthralgias hot flashes and increased risk of osteoporosis.  Goal of treatment is cure.   Thank you Dr.Byrnett for allowing me to participate in the care of your pleasant patient. Please do not hesitate to contact me with questions or concerns in the interim.  #Follow-up in 2 months-labs CBC CMP/MD-Dr. B    All questions were answered. The patient/family knows to call the clinic with any  problems, questions or concerns.       Cammie Sickle, MD 10/02/2018 1:06 PM

## 2018-10-02 NOTE — Assessment & Plan Note (Signed)
#  Left breast stage I breast cancer ER PR positive HER-2 negative; status post lumpectomy.  MammaPrint low risk.   # I had a long discussion with the patient in general regarding the treatment options of breast cancer including-surgery; adjuvant radiation; role of adjuvant systemic therapy including-chemotherapy antihormone therapy.   #Given the low risk MammaPrint-patient overall has good prognosis/would not recommend any additional adjuvant chemotherapy.  Recommend radiation adjuvantly.  Patient has been evaluated by Dr. Donella Stade this morning.  #Patient will need 5 years of antihormone therapy /aromatase inhibitors.  I discussed the mechanism of action of aromatase inhibitors-with blocking of estrogen to prevent breast cancer.  Also discussed the potential side effects including but not limited to arthralgias hot flashes and increased risk of osteoporosis.  Goal of treatment is cure.   Thank you Dr.Byrnett for allowing me to participate in the care of your pleasant patient. Please do not hesitate to contact me with questions or concerns in the interim.  #Follow-up in 2 months-labs CBC CMP/MD-Dr. B

## 2018-10-03 ENCOUNTER — Ambulatory Visit
Admission: RE | Admit: 2018-10-03 | Discharge: 2018-10-03 | Disposition: A | Discharge status: Home / Self Care | Payer: Medicare Other | Source: Ambulatory Visit | Attending: Neurology | Admitting: Neurology

## 2018-10-03 DIAGNOSIS — H93A1 Pulsatile tinnitus, right ear: Secondary | ICD-10-CM

## 2018-10-03 DIAGNOSIS — H532 Diplopia: Secondary | ICD-10-CM | POA: Diagnosis not present

## 2018-10-03 DIAGNOSIS — H93A9 Pulsatile tinnitus, unspecified ear: Secondary | ICD-10-CM | POA: Diagnosis not present

## 2018-10-03 LAB — POCT I-STAT CREATININE: Creatinine, Ser: 1.7 mg/dL — ABNORMAL HIGH (ref 0.44–1.00)

## 2018-10-03 MED ORDER — GADOBUTROL 1 MMOL/ML IV SOLN
9.0000 mL | Freq: Once | INTRAVENOUS | Status: AC | PRN
  Administered 2018-10-03: 9 mL via INTRAVENOUS

## 2018-10-04 ENCOUNTER — Ambulatory Visit (INDEPENDENT_AMBULATORY_CARE_PROVIDER_SITE_OTHER): Payer: Medicare Other | Admitting: Pharmacist

## 2018-10-04 ENCOUNTER — Encounter: Payer: Self-pay | Admitting: Pharmacist

## 2018-10-04 DIAGNOSIS — E118 Type 2 diabetes mellitus with unspecified complications: Secondary | ICD-10-CM

## 2018-10-04 DIAGNOSIS — E1165 Type 2 diabetes mellitus with hyperglycemia: Secondary | ICD-10-CM | POA: Diagnosis not present

## 2018-10-04 DIAGNOSIS — IMO0002 Reserved for concepts with insufficient information to code with codable children: Secondary | ICD-10-CM

## 2018-10-04 NOTE — Patient Instructions (Signed)
Visit Information  Goals Addressed            This Visit's Progress     Patient Stated   . "My medications are expensive" (pt-stated)       Current Barriers:  . Financial barriers - patient expresses that copayment for Trulicity and Eliquis are both >$650 per month, Trulicity being >$354. . Trulicity - S5KC, uncontrolled, most recent A1c 8.0%, impacted by stress and recent surgeries;  . Eliquis - thromboembolism prevention, patient notes a hx of PE. Recent dose decrease by Dr. [name] to 2.5 mg BID for prevention dosing.   Pharmacist Clinical Goal(s):  [name] Over the next 90 days, patient will work with medical team to address needs related to optimized disease management  Interventions: . Comprehensive medication review performed. . Discussed patient assistance for Eliquis and Trulicity. Patient located her 2019 tax return; unfortunately, her household income is >400% of FLP, which puts her over the income limit for assistance for either of these products.  . Discussed that she could investigate alternative Medicare Part D plans for 2021 once Open Enrollment begins. Discussed [place] [facility] service that provides Medicare plan counseling for seniors - will provide this information to the patient.  Patient Self Care Activities:  . Self administers medications as prescribed . Calls pharmacy for medication refills  Initial goal documentation        The patient verbalized understanding of instructions provided today and declined a print copy of patient instruction materials.   Plan: - Will outreach patient in 4-5 weeks for continued support. Will provide [place] [facility] information resource to patient via MyChart as requested.   Catie Darnelle Maffucci, PharmD Clinical Pharmacist [place] [phone]

## 2018-10-04 NOTE — Chronic Care Management (AMB) (Signed)
Chronic Care Management   Note  10/04/2018 Name: Tammy [name] MRN: [id number] DOB: 07-Aug-1945   Subjective:  Tammy [name] is a 73 y.o. year old female who is a primary care patient of [name], DO. The CCM team was consulted for assistance with chronic disease management and care coordination needs.    Received referral for medication assistance.  Ms. Spradlin was given information about Chronic Care Management services today including:  1. CCM service includes personalized support from designated clinical staff supervised by her physician, including individualized plan of care and coordination with other care providers 2. 24/7 contact phone numbers for assistance for urgent and routine care needs. 3. Service will only be billed when office clinical staff spend 20 minutes or more in a month to coordinate care. 4. Only one practitioner may furnish and bill the service in a calendar month. 5. The patient may stop CCM services at any time (effective at the end of the month) by phone call to the office staff. 6. The patient will be responsible for cost sharing (co-pay) of up to 20% of the service fee (after annual deductible is met).  Patient agreed to services and verbal consent obtained.   Review of patient status, including review of consultants reports, laboratory and other test data, was performed as part of comprehensive evaluation and provision of chronic care management services.   Objective:  Lab Results  Component Value Date   CREATININE 1.70 (H) 10/03/2018   CREATININE 1.40 (H) 08/22/2018   CREATININE 1.59 (H) 05/21/2018    Lab Results  Component Value Date   HGBA1C 8.0 (H) 10/01/2018       Component Value Date/Time   CHOL 126 05/21/2018 1355   CHOL 152 01/31/2016 1419   TRIG 195 (H) 05/21/2018 1355   TRIG 224 (H) 01/31/2016 1419   HDL 40 05/21/2018 1355   CHOLHDL 3.2 09/07/2015 0805   CHOLHDL 3.4 12/17/2014 1017   VLDL 45 (H) 01/31/2016 1419   LDLCALC 47  05/21/2018 1355    Clinical ASCVD: No    BP Readings from Last 3 Encounters:  10/02/18 (!) 108/45  10/02/18 (!) 108/45  10/01/18 114/74    Allergies  Allergen Reactions  . Hydrocodone Other (See Comments)    Unresponsive  . Aleve [Naproxen Sodium]     Hives & itching   . Contrast Media [Iodinated Diagnostic Agents]     Pt avoids due to kidney issues  . Ioxaglate Other (See Comments)    (iodine) Pt avoids due to kidney issues   . Oxycodone Other (See Comments)    hallucinations  . Ranexa [Ranolazine]     Sick on stomach  . Sulfa Antibiotics Other (See Comments)    Pt avoids due to kidney issues  . Tramadol Other (See Comments)    nightmares    Medications Reviewed Today    Reviewed by De Hollingshead, [facility] (Pharmacist) on 10/04/18 at 1106  Med List Status: <None>  Medication Order Taking? Sig Documenting Provider Last Dose Status Informant  albuterol (PROAIR HFA) 108 (90 Base) MCG/ACT inhaler [id number] No INHALE 2 PUFFS EVERY 4 HOURS AS NEEDED FOR WHEEZING OR SHORTNESS OF BREATH  Patient not taking: Reported on 10/04/2018   [name], DO Not Taking Active Self  apixaban (ELIQUIS) 2.5 MG TABS tablet [id number] Yes Take 1 tablet (2.5 mg) by mouth twice daily Byrnett, Forest Gleason, MD Taking Active   blood glucose meter kit and supplies KIT [id number] Yes Dispense based on  patient and insurance preference. Use one time daily as directed, E11.9 Lada, Satira Anis, MD Taking Active Self  Dulaglutide (TRULICITY) 1.5 VW/9.7XY SOPN [id number] Yes Inject 1.5 mg into the skin every Tuesday. Wynetta Emery, Megan P, DO Taking Active   DULoxetine (CYMBALTA) 60 MG capsule [id number] Yes Take 1 capsule (60 mg total) by mouth daily. Park Liter P, DO Taking Active Self  gabapentin (NEURONTIN) 300 MG capsule [id number] Yes Take 1 capsule (300 mg total) by mouth 3 (three) times daily. Park Liter P, DO Taking Active Self  glipiZIDE (GLUCOTROL) 5 MG tablet [id number] Yes TAKE ONE TABLET EVERY  DAY BEFORE BREAKFAST Johnson, Megan P, DO Taking Active Self  isosorbide mononitrate (IMDUR) 60 MG 24 hr tablet [id number] Yes Take 1 tablet (60 mg total) by mouth as directed. Take 1 tablet at bedtime and 1/2 tablet in the morning  Patient taking differently: Take 30-60 mg by mouth See admin instructions. 30 mg in the morning, and 60 mg at bedtime   Wynetta Emery, Megan P, DO Taking Active Self  levothyroxine (SYNTHROID) 150 MCG tablet [id number] Yes Take 1 tablet (150 mcg total) by mouth daily before breakfast. Park Liter P, DO Taking Active   losartan (COZAAR) 25 MG tablet [id number] Yes Take 25 mg by mouth daily. [provider] Taking Active Self  metoprolol succinate (TOPROL-XL) 50 MG 24 hr tablet [id number] Yes TAKE 1 TABLET BY MOUTH DAILY WITH OR IMMEDIATLEY FOLLOWING A MEAL Johnson, Megan P, DO Taking Active Self  nitroGLYCERIN (NITROSTAT) 0.4 MG SL tablet [id number]  Place 1 tablet (0.4 mg total) under the tongue every 5 (five) minutes as needed for chest pain. [name], MD  Active Self  pantoprazole (PROTONIX) 40 MG tablet [id number] Yes Take 1 tablet (40 mg total) by mouth daily as needed.  Patient taking differently: Take 40 mg by mouth daily.    Park Liter P, DO Taking Active Self  penicillin v potassium (VEETID) 500 MG tablet [id number] Yes Take 1 tablet (500 mg total) by mouth 4 (four) times daily. [name], MD Taking Active   pregabalin (LYRICA) 75 MG capsule [id number] Yes 1 QHS x1 week, then increase to 1 BID for 1 week, if not too sleepy 2qHS and 1 during the day for 1 week, then 2 BID Johnson, Megan P, DO Taking Active   psyllium (METAMUCIL) 58.6 % packet [id number] Yes Take 1 packet by mouth daily as needed (fiber).  [provider] Taking Active Self  rosuvastatin (CRESTOR) 40 MG tablet [id number] Yes TAKE 1 TABLET BY MOUTH DAILY Johnson, Megan P, DO Taking Active   trimethoprim (TRIMPEX) 100 MG tablet [id number] Yes Take 1 tablet (100 mg total) by  mouth daily. [name], MD Taking Active Self           Assessment:   Goals Addressed            This Visit's Progress     Patient Stated   . "My medications are expensive" (pt-stated)       Current Barriers:  . Financial barriers - patient expresses that copayment for Trulicity and Eliquis are both >$683 per month, Trulicity being >$729. . Trulicity - M2XJ, uncontrolled, most recent A1c 8.0%, impacted by stress and recent surgeries;  . Eliquis - thromboembolism prevention, patient notes a hx of PE. Recent dose decrease by Dr. [name] to 2.5 mg BID for prevention dosing.   Pharmacist Clinical Goal(s):  [name] Over the next 90 days, patient will work with medical team to  address needs related to optimized disease management  Interventions: . Comprehensive medication review performed. . Discussed patient assistance for Eliquis and Trulicity. Patient located her 2019 tax return; unfortunately, her household income is >400% of FLP, which puts her over the income limit for assistance for either of these products.  . Discussed that she could investigate alternative Medicare Part D plans for 2021 once Open Enrollment begins. Discussed [place] [facility] service that provides Medicare plan counseling for seniors - will provide this information to the patient.  Patient Self Care Activities:  . Self administers medications as prescribed . Calls pharmacy for medication refills  Initial goal documentation        Plan: - Will outreach patient in 4-5 weeks for continued support. Will provide [place] [facility] information resource to patient via MyChart as requested.   Catie Darnelle Maffucci, PharmD Clinical Pharmacist [place] [phone]

## 2018-10-07 ENCOUNTER — Ambulatory Visit
Admission: RE | Admit: 2018-10-07 | Discharge: 2018-10-07 | Disposition: A | Discharge status: Home / Self Care | Payer: Medicare Other | Source: Ambulatory Visit | Attending: Radiation Oncology | Admitting: Radiation Oncology

## 2018-10-07 ENCOUNTER — Encounter: Payer: Self-pay | Admitting: Surgery

## 2018-10-07 ENCOUNTER — Other Ambulatory Visit: Payer: Self-pay

## 2018-10-07 ENCOUNTER — Ambulatory Visit (INDEPENDENT_AMBULATORY_CARE_PROVIDER_SITE_OTHER): Payer: Medicare Other | Admitting: Surgery

## 2018-10-07 VITALS — BP 158/83 | HR 96 | Temp 97.7°F | Ht 66.0 in | Wt 215.0 lb

## 2018-10-07 DIAGNOSIS — C50312 Malignant neoplasm of lower-inner quadrant of left female breast: Secondary | ICD-10-CM | POA: Insufficient documentation

## 2018-10-07 DIAGNOSIS — Z09 Encounter for follow-up examination after completed treatment for conditions other than malignant neoplasm: Secondary | ICD-10-CM

## 2018-10-07 DIAGNOSIS — Z170 Estrogen receptor positive status [ER+]: Secondary | ICD-10-CM | POA: Diagnosis not present

## 2018-10-07 DIAGNOSIS — Z510 Encounter for antineoplastic radiation therapy: Secondary | ICD-10-CM | POA: Insufficient documentation

## 2018-10-07 NOTE — Patient Instructions (Signed)
Return as needed.The patient is aware to call back for any questions or concerns.  

## 2018-10-08 ENCOUNTER — Ambulatory Visit (INDEPENDENT_AMBULATORY_CARE_PROVIDER_SITE_OTHER): Payer: Medicare Other | Admitting: Vascular Surgery

## 2018-10-08 ENCOUNTER — Encounter (INDEPENDENT_AMBULATORY_CARE_PROVIDER_SITE_OTHER): Payer: Self-pay | Admitting: Vascular Surgery

## 2018-10-08 ENCOUNTER — Ambulatory Visit (INDEPENDENT_AMBULATORY_CARE_PROVIDER_SITE_OTHER): Payer: Medicare Other

## 2018-10-08 ENCOUNTER — Ambulatory Visit: Payer: Medicare Other | Admitting: General Surgery

## 2018-10-08 ENCOUNTER — Encounter: Payer: Self-pay | Admitting: Surgery

## 2018-10-08 VITALS — BP 139/83 | HR 88 | Resp 16 | Ht 66.0 in | Wt 213.8 lb

## 2018-10-08 DIAGNOSIS — Z510 Encounter for antineoplastic radiation therapy: Secondary | ICD-10-CM | POA: Diagnosis not present

## 2018-10-08 DIAGNOSIS — Z79899 Other long term (current) drug therapy: Secondary | ICD-10-CM

## 2018-10-08 DIAGNOSIS — E118 Type 2 diabetes mellitus with unspecified complications: Secondary | ICD-10-CM

## 2018-10-08 DIAGNOSIS — IMO0002 Reserved for concepts with insufficient information to code with codable children: Secondary | ICD-10-CM

## 2018-10-08 DIAGNOSIS — E782 Mixed hyperlipidemia: Secondary | ICD-10-CM

## 2018-10-08 DIAGNOSIS — E1165 Type 2 diabetes mellitus with hyperglycemia: Secondary | ICD-10-CM

## 2018-10-08 DIAGNOSIS — I209 Angina pectoris, unspecified: Secondary | ICD-10-CM | POA: Diagnosis not present

## 2018-10-08 DIAGNOSIS — Z7984 Long term (current) use of oral hypoglycemic drugs: Secondary | ICD-10-CM

## 2018-10-08 DIAGNOSIS — I10 Essential (primary) hypertension: Secondary | ICD-10-CM | POA: Diagnosis not present

## 2018-10-08 DIAGNOSIS — I739 Peripheral vascular disease, unspecified: Secondary | ICD-10-CM

## 2018-10-08 DIAGNOSIS — C50312 Malignant neoplasm of lower-inner quadrant of left female breast: Secondary | ICD-10-CM | POA: Diagnosis not present

## 2018-10-08 DIAGNOSIS — Z170 Estrogen receptor positive status [ER+]: Secondary | ICD-10-CM | POA: Diagnosis not present

## 2018-10-08 NOTE — Assessment & Plan Note (Signed)
Her ABIs today are slightly decreased from her last study but her waveforms remain strong and biphasic with a right ABI of 0.87 and a left ABI 0.69.  Her toe brachial indices are actually a little higher today than they were at the last visit at 0.68 on the right and 0.62 on the left. Overall she is doing well from a peripheral arterial disease standpoint.  Continue current medical regimen and recheck in 1 year.

## 2018-10-08 NOTE — Assessment & Plan Note (Signed)
blood pressure control important in reducing the progression of atherosclerotic disease. On appropriate oral medications.  

## 2018-10-08 NOTE — Progress Notes (Signed)
S/p Left lumpectomy [facility]NB 05/12/02 complicated by infected seroma that was aspirated, antibiotics have been completed. Propionibacterium avid from cultre Doing very well No complaints No fevers or chills  PE NAD Wound healing well, no breast deformity, no seroma or infection  A/P Doing very well after lumpectomy resolved infected seroma No surgical issues at this time She does have f/u w oncology RTC prn

## 2018-10-08 NOTE — Patient Instructions (Signed)
Peripheral Vascular Disease  Peripheral vascular disease (PVD) is a disease of the blood vessels that are not part of your heart and brain. A simple term for PVD is poor circulation. In most cases, PVD narrows the blood vessels that carry blood from your heart to the rest of your body. This can reduce the supply of blood to your arms, legs, and internal organs, like your stomach or kidneys. However, PVD most often affects a person's lower legs and feet. Without treatment, PVD tends to get worse. PVD can also lead to acute ischemic limb. This is when an arm or leg suddenly cannot get enough blood. This is a medical emergency. Follow these instructions at home: Lifestyle  Do not use any products that contain nicotine or tobacco, such as cigarettes and e-cigarettes. If you need help quitting, ask your doctor.  Lose weight if you are overweight. Or, stay at a healthy weight as told by your doctor.  Eat a diet that is low in fat and cholesterol. If you need help, ask your doctor.  Exercise regularly. Ask your doctor for activities that are right for you. General instructions  Take over-the-counter and prescription medicines only as told by your doctor.  Take good care of your feet: ? Wear comfortable shoes that fit well. ? Check your feet often for any cuts or sores.  Keep all follow-up visits as told by your doctor This is important. Contact a doctor if:  You have cramps in your legs when you walk.  You have leg pain when you are at rest.  You have coldness in a leg or foot.  Your skin changes.  You are unable to get or have an erection (erectile dysfunction).  You have cuts or sores on your feet that do not heal. Get help right away if:  Your arm or leg turns cold, numb, and blue.  Your arms or legs become red, warm, swollen, painful, or numb.  You have chest pain.  You have trouble breathing.  You suddenly have weakness in your face, arm, or leg.  You become very  confused or you cannot speak.  You suddenly have a very bad headache.  You suddenly cannot see. Summary  Peripheral vascular disease (PVD) is a disease of the blood vessels.  A simple term for PVD is poor circulation. Without treatment, PVD tends to get worse.  Treatment may include exercise, low fat and low cholesterol diet, and quitting smoking. This information is not intended to replace advice given to you by your health care provider. Make sure you discuss any questions you have with your health care provider. Document Released: 05/24/2009 Document Revised: 02/09/2017 Document Reviewed: 04/06/2016 [employer] Patient Education  2020 [employer]  

## 2018-10-08 NOTE — Progress Notes (Signed)
MRN : [id number]  LIZZETT NOBILE is a 73 y.o. ([birth year]) female who presents with chief complaint of  Chief Complaint  Patient presents with   Follow-up    ultrasound follow up  .  History of Present Illness: Patient returns today in follow up of her PAD.  She is doing well.  She has some back pain and some pain down her legs from neuropathic issues.  This has been present for some years.  Her claudication symptoms were markedly improved with intervention to both lower extremities 7 to 8 years ago.  Her ABIs today are slightly decreased from her last study but her waveforms remain strong and biphasic with a right ABI of 0.87 and a left ABI 0.69.  Her toe brachial indices are actually a little higher today than they were at the last visit at 0.68 on the right and 0.62 on the left.  Current Outpatient Medications  Medication Sig Dispense Refill   albuterol (PROAIR HFA) 108 (90 Base) MCG/ACT inhaler INHALE 2 PUFFS EVERY 4 HOURS AS NEEDED FOR WHEEZING OR SHORTNESS OF BREATH 8.5 g 3   apixaban (ELIQUIS) 2.5 MG TABS tablet Take 1 tablet (2.5 mg) by mouth twice daily 180 tablet 3   blood glucose meter kit and supplies KIT Dispense based on patient and insurance preference. Use one time daily as directed, E11.9 1 each 0   Dulaglutide (TRULICITY) 1.5 OI/3.2PQ SOPN Inject 1.5 mg into the skin every Tuesday. 4 pen 6   DULoxetine (CYMBALTA) 60 MG capsule Take 1 capsule (60 mg total) by mouth daily. 90 capsule 1   gabapentin (NEURONTIN) 300 MG capsule Take 1 capsule (300 mg total) by mouth 3 (three) times daily. 270 capsule 3   glipiZIDE (GLUCOTROL) 5 MG tablet TAKE ONE TABLET EVERY DAY BEFORE BREAKFAST 90 tablet 1   isosorbide mononitrate (IMDUR) 60 MG 24 hr tablet Take 1 tablet (60 mg total) by mouth as directed. Take 1 tablet at bedtime and 1/2 tablet in the morning (Patient taking differently: Take 30-60 mg by mouth See admin instructions. 30 mg in the morning, and 60 mg at bedtime) 135 tablet  3   levothyroxine (SYNTHROID) 150 MCG tablet Take 1 tablet (150 mcg total) by mouth daily before breakfast. 90 tablet 3   losartan (COZAAR) 25 MG tablet Take 25 mg by mouth daily.     metoprolol succinate (TOPROL-XL) 50 MG 24 hr tablet TAKE 1 TABLET BY MOUTH DAILY WITH OR IMMEDIATLEY FOLLOWING A MEAL 90 tablet 3   nitroGLYCERIN (NITROSTAT) 0.4 MG SL tablet Place 1 tablet (0.4 mg total) under the tongue every 5 (five) minutes as needed for chest pain. 25 tablet 6   pantoprazole (PROTONIX) 40 MG tablet Take 1 tablet (40 mg total) by mouth daily as needed. (Patient taking differently: Take 40 mg by mouth daily. ) 90 tablet 1   penicillin v potassium (VEETID) 500 MG tablet Take 1 tablet (500 mg total) by mouth 4 (four) times daily. 40 tablet 1   pregabalin (LYRICA) 75 MG capsule 1 QHS x1 week, then increase to 1 BID for 1 week, if not too sleepy 2qHS and 1 during the day for 1 week, then 2 BID 120 capsule 3   psyllium (METAMUCIL) 58.6 % packet Take 1 packet by mouth daily as needed (fiber).      rosuvastatin (CRESTOR) 40 MG tablet TAKE 1 TABLET BY MOUTH DAILY 90 tablet 1   trimethoprim (TRIMPEX) 100 MG tablet Take 1 tablet (100 mg  total) by mouth daily. 30 tablet 6   No current facility-administered medications for this visit.     Past Medical History:  Diagnosis Date   Asthma    Benign neoplasm of colon    Cervical disc disease    "bulging" - no limitations per pt   Chronic diastolic CHF (congestive heart failure) ([place])    a. 07/2012 Echo: Nl EF. mild inferoseptal HK, Gr 2 DD, mild conc LVH, mild PR/TR, mild PAH.   CKD (chronic kidney disease), stage III (HCC)    COPD (chronic obstructive pulmonary disease) (HCC)    Coronary artery disease    a. 11/2011 Cath: LAD 71md, LCX min irregs, RCA 70p, 1027mith L->R collats, EF 60%-->Med Rx.   Diabetes mellitus without complication (HCC)    Fusion of lumbar spine 12/22/2015   GERD (gastroesophageal reflux disease)    History of  DVT (deep vein thrombosis)    History of tobacco abuse    a. Quit 2011.   Hyperlipidemia    Hypertension    Hypothyroidism    Obesity    Palpitations    PE (pulmonary embolism) 10/15   PONV (postoperative nausea and vomiting)    [place]ulmonary [place]us [place]   R[place]. Presented with SOB and elevated d-dimer.    PVD [place]peripheral vascular disease) ([facility]   a. PTA of right leg and left femoral artery with stenosis.   Renal insufficiency    [place]   TIAs - no deficits    Past Surgical History:  Procedure Laterality Date   ANGIOPLASTY / STENTING FEMORAL     PVD; angioplasty right leg and left femoral artery with stenosis.    APPENDECTOMY  Oct. 2016   BACK SURGERY  03/2012   BACK SURGERY     screws, rods replaced with new hardware.   BACK SURGERY  11/2016   CARDIAC CATHETERIZATION  12/07/2011   Mid LAD 50%, distal LAD 50%, mid RCA 70%, distal RCA 100%    CARDIAC CATHETERIZATION  01/04/2011   100% occluded mid RCA with good collaterals from distal LAD, normal LVEF.   CHOLECYSTECTOMY     COLONOSCOPY  2013   COLONOSCOPY WITH PROPOFOL N/A 05/24/2015   Procedure: COLONOSCOPY WITH PROPOFOL;  Surgeon: DaLucilla LameMD;  [place] [place] [facility] [place];  Laterality: N/A;  Diabetic - oral meds   LAPAROSCOPIC APPENDECTOMY N/A 01/06/2015   Procedure: APPENDECTOMY LAPAROSCOPIC;  Surgeon: SeChristene LyeMD;  Location: [facility] ORS;  Service: General;  Laterality: N/A;   MASTECTOMY W/ SENTINEL NODE BIOPSY Left 08/26/2018   Procedure: PARTIAL MASTECTOMY WIDE EXCISION WITH SENTINEL LYMPH NODE BIOPSY LEFT;  Surgeon: ByRobert BellowMD;  Location: [facility] ORS;  Service: General;  Laterality: Left;   POLYPECTOMY  05/24/2015   Procedure: POLYPECTOMY;  Surgeon: DaLucilla LameMD;  [place] [facility];;   THYROIDECTOMY     TRIGGER FINGER RELEASE      Social History Social History   Tobacco Use   Smoking  status: Former Smoker    Packs/day: 0.50    Years: 15.00    Pack years: 7.50    Types: Cigarettes    Quit date: 03/13/2009    Years since quitting: 9.5   Smokeless tobacco: Never Used  Substance Use Topics   Alcohol use: No    Alcohol/week: 0.0 standard drinks   Drug use: No    Family History Family History  Problem Relation Age of Onset   Hypertension Father  Heart disease Father    Heart attack Father    Diabetes Father    Cancer Mother        colon   Heart attack Brother    Diabetes Brother    Hypertension Brother    Heart disease Brother    Heart attack Brother    Diabetes Brother    Hypertension Brother    Heart disease Brother    Heart attack Brother    Diabetes Brother    Hypertension Brother    Heart disease Brother    Cancer Sister        lung   COPD Sister    COPD Sister    Breast cancer Neg Hx     Allergies  Allergen Reactions   Hydrocodone Other (See Comments)    Unresponsive   Aleve [Naproxen Sodium]     Hives & itching    Contrast Media [Iodinated Diagnostic Agents]     Pt avoids due to kidney issues   Ioxaglate Other (See Comments)    (iodine) Pt avoids due to kidney issues    Oxycodone Other (See Comments)    hallucinations   Ranexa [Ranolazine]     Sick on stomach   Sulfa Antibiotics Other (See Comments)    Pt avoids due to kidney issues   Tramadol Other (See Comments)    nightmares     REVIEW OF SYSTEMS (Negative unless checked)  Constitutional: [] ?Weight loss  [] ?Fever  [] ?Chills Cardiac: [] ?Chest pain   [] ?Chest pressure   [x] ?Palpitations   [] ?Shortness of breath when laying flat   [] ?Shortness of breath at rest   [] ?Shortness of breath with exertion. Vascular:  [] ?Pain in legs with walking   [] ?Pain in legs at rest   [] ?Pain in legs when laying flat   [] ?Claudication   [] ?Pain in feet when walking  [] ?Pain in feet at rest  [] ?Pain in feet when laying flat   [x] ?History of DVT   [x] ?Phlebitis    [x] ?Swelling in legs   [] ?Varicose veins   [] ?Non-healing ulcers Pulmonary:   [] ?Uses home oxygen   [] ?Productive cough   [] ?Hemoptysis   [] ?Wheeze  [x] ?COPD   [] ?Asthma Neurologic:  [] ?Dizziness  [] ?Blackouts   [] ?Seizures   [x] ?History of stroke   [] ?History of TIA  [] ?Aphasia   [] ?Temporary blindness   [] ?Dysphagia   [] ?Weakness or numbness in arms   [x] ?Weakness or numbness in legs Musculoskeletal:  [x] ?Arthritis   [] ?Joint swelling   [x] ?Joint pain   [x] ?Low back pain Hematologic:  [] ?Easy bruising  [] ?Easy bleeding   [] ?Hypercoagulable state   [] ?Anemic   Gastrointestinal:  [] ?Blood in stool   [] ?Vomiting blood  [x] ?Gastroesophageal reflux/heartburn   [] ?Abdominal pain Genitourinary:  [] ?Chronic kidney disease   [] ?Difficult urination  [] ?Frequent urination  [] ?Burning with urination   [] ?Hematuria Skin:  [] ?Rashes   [] ?Ulcers   [] ?Wounds Psychological:  [] ?History of anxiety   [] ? History of major depression.   Physical Examination  BP 139/83 (BP Location: Right Arm)    Pulse 88    Resp 16    Ht 5' 6"  (1.676 m)    Wt 213 lb 12.8 oz (97 kg)    BMI 34.51 kg/m  Gen:  WD/WN, NAD Head: /AT, No temporalis wasting. Ear/Nose/Throat: Hearing grossly intact, nares w/o erythema or drainage Eyes: Conjunctiva clear. Sclera non-icteric Neck: Supple.  Trachea midline Pulmonary:  Good air movement, no use of accessory muscles.  Cardiac: RRR, no JVD Vascular:  Vessel Right Left  Radial Palpable Palpable  PT  1+ palpable  1+ palpable  DP Palpable  1+ palpable   Gastrointestinal: soft, non-tender/non-distended. No guarding/reflex.  Musculoskeletal: M/S 5/5 throughout.  No deformity or atrophy.  No edema. Neurologic: Sensation grossly intact in extremities.  Symmetrical.  Speech is fluent.  Psychiatric: Judgment intact, Mood & affect appropriate for pt's clinical situation. Dermatologic: No rashes or ulcers noted.  No cellulitis or open  wounds.       Labs Recent Results (from the past 2160 hour(s))  CBC     Status: None   Collection Time: 08/22/18  9:54 AM  Result Value Ref Range   WBC 9.7 4.0 - 10.5 K/uL   RBC 4.77 3.87 - 5.11 MIL/uL   Hemoglobin 13.3 12.0 - 15.0 g/dL   HCT 43.3 36.0 - 46.0 %   MCV 90.8 80.0 - 100.0 fL   MCH 27.9 26.0 - 34.0 pg   MCHC 30.7 30.0 - 36.0 g/dL   RDW 13.4 11.5 - 15.5 %   Platelets 261 150 - 400 K/uL   nRBC 0.0 0.0 - 0.2 %    Comment: Performed at [facility], [address] [place], [place] [address]  Basic metabolic panel     Status: Abnormal   Collection Time: 08/22/18  9:54 AM  Result Value Ref Range   Sodium 141 135 - 145 mmol/L   Potassium 4.7 3.5 - 5.1 mmol/L   Chloride 106 98 - 111 mmol/L   CO2 27 22 - 32 mmol/L   Glucose, Bld 159 (H) 70 - 99 mg/dL   BUN 24 (H) 8 - 23 mg/dL   Creatinine, Ser 1.40 (H) 0.44 - 1.00 mg/dL   Calcium 8.6 (L) 8.9 - 10.3 mg/dL   GFR calc non Af Amer 37 (L) >60 mL/min   GFR calc Af Amer 42 (L) >60 mL/min   Anion gap 8 5 - 15    Comment: Performed at [facility], [address] [place], [place] [address]  Novel Coronavirus, NAA (hospital order; send-out to ref lab)     Status: None   Collection Time: 08/22/18 10:06 AM   Specimen: Nasopharyngeal Swab; Respiratory  Result Value Ref Range   SARS-CoV-2, NAA NOT DETECTED NOT DETECTED    Comment: (NOTE) This test was developed and its performance characteristics determined by [employer]. This test has not been FDA cleared or approved. This test has been authorized by FDA under an Emergency Use Authorization (EUA). This test is only authorized for the duration of time the declaration that circumstances exist justifying the authorization of the emergency use of in vitro diagnostic tests for detection of SARS-CoV-2 virus and/or diagnosis of COVID-19 infection under section 564(b)(1) of the Act, 21 U.S.C. 833XOV-2(N)(1), unless the authorization is terminated or  revoked sooner. When diagnostic testing is negative, the possibility of a false negative result should be considered in the context of a patient's recent exposures and the presence of clinical signs and symptoms consistent with COVID-19. An individual without symptoms of COVID-19 and who is not shedding SARS-CoV-2 virus would expect to have a negative (not detected) result in this assay. Performed  At: [facility] [place], [place] [id number] Rush Farmer MD HT:[id number]    Coronavirus Source NASOPHARYNGEAL     Comment: Performed at [facility], [address] [place], [place] [address]  Glucose, capillary     Status: Abnormal   Collection Time: 08/26/18  9:01 AM  Result Value Ref Range   Glucose-Capillary 157 (H)  70 - 99 mg/dL  Surgical pathology     Status: None   Collection Time: 08/26/18 10:45 AM  Result Value Ref Range   SURGICAL PATHOLOGY      Surgical Pathology CASE: [id number] PATIENT: Palma Nieblas Surgical Pathology Report     SPECIMEN SUBMITTED: A. Breast, left lower inner quadrant; wide excision B. Lymph nodes 1, 2, and 3, left axillary C. Axillary fat, left  CLINICAL HISTORY: None provided  PRE-OPERATIVE DIAGNOSIS: Left breast cancer  POST-OPERATIVE DIAGNOSIS: Same as pre-op     DIAGNOSIS: A.  BREAST, LEFT LOWER INNER QUADRANT; WIDE EXCISION: - INVASIVE MAMMARY CARCINOMA. - DUCTAL CARCINOMA IN SITU. - SEE CANCER SUMMARY BELOW. - BIOPSY SITE CHANGE WITH MARKER.  B.  LYMPH NODES, LEFT AXILLARY 1, 2, AND 3; EXCISION: - THREE LYMPH NODES NEGATIVE FOR MALIGNANCY (0/3).  C.  AXILLARY FAT, LEFT; EXCISION: - ONE LYMPH NODE NEGATIVE FOR MALIGNANCY (0/1).  CANCER CASE SUMMARY: INVASIVE CARCINOMA OF THE BREAST Procedure: Wide excision Specimen Laterality: Left Tumor Size: 14 mm Histologic Type: Invasive carcinoma of no special type Histologic Grade (Nottingham Histologic Score)      Gla ndular (Acinar)/Tubular  Differentiation: 3            Nuclear Pleomorphism: 2            Mitotic Rate: 2            Overall Grade: 2 Ductal Carcinoma In Situ (DCIS): Present, nuclear grade 2 with expansile comedonecrosis      Negative for extensive intraductal component Margins:      Invasive Carcinoma Margins: Uninvolved by invasive carcinoma                      Distance from closest margin: 11 mm                      Specify closest margin: Anterior      DCIS Margins: Uninvolved by DCIS                      Distance from closest margin: 22 mm                      Specify closest margin: Medial Regional Lymph Nodes: Uninvolved by tumor cells                      Total Number of Lymph Nodes Examined: 4                      Number of Sentinel Nodes Examined: 3 Treatment Effect in the Breast: No known presurgical therapy Lymphovascular Invasion: Not identified Pathologic Stage Classification (pTNM, AJCC 8th Edition): pT1c pN0(sn) Breast Biomarker Testing (per outside  report): ER positive, PR positive, HER2 negative.  Comment: Sections demonstrate 14 mm of invasive mammary carcinoma with an adjacent focus of DCIS spanning 12 mm.   GROSS DESCRIPTION: A. Intraoperative Consultation:     Labeled: Left lower inner quadrant wide excision     Received: Fresh     Specimen: Oriented breast lumpectomy     Pathologic evaluation performed: Gross margin evaluation     Diagnosis: IOC-mass present, margins grossly negative. 2 mm to anterior margin.     Communicated to: Dr. Bary Castilla at 11:05 AM on 08/26/2018 by Quay Burow, MD     Tissue submitted: Not applicable  A. Labeled: Left lower inner quadrant wide excision Received: Fresh  for intraoperative consultation Accompanying specimen radiograph: Yes Radiographic findings: Biopsy clip is present. Time in fixative: Collected at 10:45 AM and placed into formalin at 11:05 AM on 08/26/2018.  Cold ischemic time: Less than 1 hour Total fixation time: 30.5 hours Type  of procedure: Oriented brea st lumpectomy Location / laterality of specimen: Left breast, lower inner quadrant Orientation of specimen: There are surgical metallic markers designating cranial and medial. Inking: Superior = blue Inferior = green Medial = yellow Lateral = orange Posterior = black Anterior/Superficial = red Size of specimen: 7.9 (superior-inferior) x 4.8 (medial-lateral) x 2.7 cm (anterior-posterior) Skin: Absent Biopsy site: Present Number of discrete masses: 1 Size of mass(es): 1.2 x 1.0 x 0.9 cm Description of mass(es): Sectioning displays an ill-defined mass with a pale-tan, indurated cut surface admixed with possible calcifications. The mass displays a 0.4 x 0.4 x 0.3 cm focal area of hemorrhage (suspicious for biopsy site change) with an embedded omega-shaped biopsy clip. Distance between masses/clips: Not applicable Margins: Superior - 4.0 cm, inferior - 1.3 cm, anterior - 0.2 cm, posterior - 1.1 cm, medial - 0.5 cm, and lateral - 2.4 cm. Description of remainder of t issue: Sectioning the remainder of the specimen displays tan-yellow, lobulated, otherwise grossly unremarkable fibroadipose tissue with a fibrous to adipose ratio of 5:95.  No additional abnormalities or mass lesions are grossly identified.  Block summary: 1-9 - entire mass in relation to anterior and medial resection margins (submitted from inferior to superior; biopsy clip site in cassette 2) 10 - posterior resection margin closest to mass 11 - lateral resection margin closest to mass 12 - inferior resection margin closest to mass 13 - superior resection margin closest to mass  B. Labeled: Left axillary lymph nodes 1, 2, and 3 Received: Formalin Tissue fragment(s): 3 Size: Aggregate, 3.0 x 1.5 x 0.7 cm Description: Received are irregular fragments of tan-yellow adipose tissue.  Palpation reveals 3 lymph node candidates ranging from 0.5 to 1.5 cm in greatest dimension.  The lymph nodes  are submitted entirely as follows: 1 - one, intact lymph node 2 - one, bisect ed lymph node 3 - one, trisected lymph node  C. Labeled: Left axillary fat Received: Formalin Tissue fragment(s): 1 Size: 1.7 x 1.3 x 0.5 cm Description: Received is an irregular fragment of tan-yellow adipose tissue.  Palpation reveals no lymph node candidates.  No abnormalities are grossly identified. Entirely submitted in cassette 1.   Final Diagnosis performed by Quay Burow, MD.   Electronically signed 08/29/2018 7:23:37AM The electronic signature indicates that the named Attending Pathologist has evaluated the specimen  Technical component performed at [facility], [address], [place], [place] [address] Lab: [phone] Dir: Rush Farmer, MD, MMM  Professional component performed at [facility], [facility], [address], [place], [place] [address] Lab: [phone] Dir: Dellia Nims. Rubinas, MD   Anaerobic and Aerobic Culture     Status: Abnormal   Collection Time: 09/12/18 12:08 PM   Specimen: Breast   BREAST LEFT BREAST 8''  Result Value Ref Range   Anaerobic Culture Final report (A)    Result 1 Comment (A)     Comment: Propionibacterium avidum Heavy growth    Aerobic Culture Final report    Result 1 Mixed skin flora     Comment: Light growth  Bayer DCA Hb A1c Waived     Status: Abnormal   Collection Time: 10/01/18 10:46 AM  Result Value Ref Range   HB A1C (BAYER DCA - WAIVED) 8.0 (H) <  7.0 %    Comment:                                       Diabetic Adult            <7.0                                       Healthy Adult        4.3 - 5.7                                                           (DCCT/NGSP) American Diabetes Association's Summary of Glycemic Recommendations for Adults with Diabetes: Hemoglobin A1c <7.0%. More stringent glycemic goals (A1c <6.0%) may further reduce complications at the cost of increased risk of hypoglycemia.   I-STAT creatinine      Status: Abnormal   Collection Time: 10/03/18  7:18 PM  Result Value Ref Range   Creatinine, Ser 1.70 (H) 0.44 - 1.00 mg/dL    Radiology Mr Jeri Cos Wo Contrast  Result Date: 10/04/2018 CLINICAL DATA:  Double vision for 1 month with pulsatile tinnitus for 1 year. EXAM: MRI HEAD WITHOUT CONTRAST MRV HEAD WITHOUT CONTRAST TECHNIQUE: Multiplanar, multiecho pulse sequences of the brain and surrounding structures were obtained without intravenous contrast. Angiographic images of the intracranial venous structures were obtained using MRV technique without intravenous contrast. COMPARISON:  02/26/2013 brain MRI FINDINGS: Brain: No acute infarct, hemorrhage, hydrocephalus, mass, or collection. Small remote anterior and lateral left frontal infarct. Mild to moderate small vessel ischemic type change in the cerebral white matter without significant change. Small remote left cerebellar infarct. No specific finding in the brainstem, cisterns, or skull base to explain double vision. Brain volume is normal. Vascular: Venous findings below.  Normal flow voids. Osseous: Degenerative facet spurring with C3-4 anterolisthesis. Negative for marrow lesion. Orbits and sinuses: Negative MRV: Motion degraded evaluation. No evidence of dural venous sinus thrombosis. Paired deep veins are symmetrically visualized patent. IMPRESSION: Brain MRI: No specific explanation for symptoms. Stable chronic ischemic injury when compared to 2014. MRV: Negative motion degraded exam. Electronically Signed   By: Monte Fantasia M.D.   On: 10/04/2018 10:26   Mr Mrv Head Wo Contrast  Result Date: 10/04/2018 CLINICAL DATA:  Double vision for 1 month with pulsatile tinnitus for 1 year. EXAM: MRI HEAD WITHOUT CONTRAST MRV HEAD WITHOUT CONTRAST TECHNIQUE: Multiplanar, multiecho pulse sequences of the brain and surrounding structures were obtained without intravenous contrast. Angiographic images of the intracranial venous structures were obtained using  MRV technique without intravenous contrast. COMPARISON:  02/26/2013 brain MRI FINDINGS: Brain: No acute infarct, hemorrhage, hydrocephalus, mass, or collection. Small remote anterior and lateral left frontal infarct. Mild to moderate small vessel ischemic type change in the cerebral white matter without significant change. Small remote left cerebellar infarct. No specific finding in the brainstem, cisterns, or skull base to explain double vision. Brain volume is normal. Vascular: Venous findings below.  Normal flow voids. Osseous: Degenerative facet spurring with C3-4 anterolisthesis. Negative for marrow lesion. Orbits and sinuses: Negative MRV: Motion degraded evaluation. No evidence of dural venous sinus thrombosis.  Paired deep veins are symmetrically visualized patent. IMPRESSION: Brain MRI: No specific explanation for symptoms. Stable chronic ischemic injury when compared to 2014. MRV: Negative motion degraded exam. Electronically Signed   By: Monte Fantasia M.D.   On: 10/04/2018 10:26    Assessment/Plan Hyperlipidemia lipid control important in reducing the progression of atherosclerotic disease. Continue statin therapy   Diabetes mellitus type 2 with complications, uncontrolled blood glucose control important in reducing the progression of atherosclerotic disease. Also, involved in wound healing. On appropriate medications.  Essential hypertension blood pressure control important in reducing the progression of atherosclerotic disease. On appropriate oral medications.   PAD (peripheral artery disease) Her ABIs today are slightly decreased from her last study but her waveforms remain strong and biphasic with a right ABI of 0.87 and a left ABI 0.69.  Her toe brachial indices are actually a little higher today than they were at the last visit at 0.68 on the right and 0.62 on the left. Overall she is doing well from a peripheral arterial disease standpoint.  Continue current medical regimen and  recheck in 1 year.    Leotis Pain, MD  10/08/2018 2:55 PM    This note was created with Dragon medical transcription system.  Any errors from dictation are purely unintentional

## 2018-10-11 ENCOUNTER — Other Ambulatory Visit: Payer: Self-pay | Admitting: *Deleted

## 2018-10-11 DIAGNOSIS — C50312 Malignant neoplasm of lower-inner quadrant of left female breast: Secondary | ICD-10-CM

## 2018-10-14 ENCOUNTER — Other Ambulatory Visit: Payer: Self-pay

## 2018-10-14 ENCOUNTER — Ambulatory Visit
Admission: RE | Admit: 2018-10-14 | Discharge: 2018-10-14 | Disposition: A | Discharge status: Home / Self Care | Payer: Medicare Other | Source: Ambulatory Visit | Attending: Radiation Oncology | Admitting: Radiation Oncology

## 2018-10-14 DIAGNOSIS — Z170 Estrogen receptor positive status [ER+]: Secondary | ICD-10-CM | POA: Diagnosis not present

## 2018-10-14 DIAGNOSIS — C50312 Malignant neoplasm of lower-inner quadrant of left female breast: Secondary | ICD-10-CM | POA: Diagnosis not present

## 2018-10-14 DIAGNOSIS — Z510 Encounter for antineoplastic radiation therapy: Secondary | ICD-10-CM | POA: Insufficient documentation

## 2018-10-15 ENCOUNTER — Ambulatory Visit
Admission: RE | Admit: 2018-10-15 | Discharge: 2018-10-15 | Disposition: A | Discharge status: Home / Self Care | Payer: Medicare Other | Source: Ambulatory Visit | Attending: Radiation Oncology | Admitting: Radiation Oncology

## 2018-10-15 ENCOUNTER — Other Ambulatory Visit: Payer: Self-pay

## 2018-10-15 DIAGNOSIS — Z170 Estrogen receptor positive status [ER+]: Secondary | ICD-10-CM | POA: Diagnosis not present

## 2018-10-15 DIAGNOSIS — C50312 Malignant neoplasm of lower-inner quadrant of left female breast: Secondary | ICD-10-CM | POA: Diagnosis not present

## 2018-10-15 DIAGNOSIS — Z510 Encounter for antineoplastic radiation therapy: Secondary | ICD-10-CM | POA: Diagnosis not present

## 2018-10-16 ENCOUNTER — Ambulatory Visit
Admission: RE | Admit: 2018-10-16 | Discharge: 2018-10-16 | Disposition: A | Discharge status: Home / Self Care | Payer: Medicare Other | Source: Ambulatory Visit | Attending: Radiation Oncology | Admitting: Radiation Oncology

## 2018-10-16 ENCOUNTER — Other Ambulatory Visit: Payer: Self-pay

## 2018-10-16 DIAGNOSIS — C50312 Malignant neoplasm of lower-inner quadrant of left female breast: Secondary | ICD-10-CM | POA: Diagnosis not present

## 2018-10-16 DIAGNOSIS — Z510 Encounter for antineoplastic radiation therapy: Secondary | ICD-10-CM | POA: Diagnosis not present

## 2018-10-16 DIAGNOSIS — Z170 Estrogen receptor positive status [ER+]: Secondary | ICD-10-CM | POA: Diagnosis not present

## 2018-10-17 ENCOUNTER — Ambulatory Visit
Admission: RE | Admit: 2018-10-17 | Discharge: 2018-10-17 | Disposition: A | Discharge status: Home / Self Care | Payer: Medicare Other | Source: Ambulatory Visit | Attending: Radiation Oncology | Admitting: Radiation Oncology

## 2018-10-17 ENCOUNTER — Other Ambulatory Visit: Payer: Self-pay

## 2018-10-17 DIAGNOSIS — Z510 Encounter for antineoplastic radiation therapy: Secondary | ICD-10-CM | POA: Diagnosis not present

## 2018-10-17 DIAGNOSIS — Z170 Estrogen receptor positive status [ER+]: Secondary | ICD-10-CM | POA: Diagnosis not present

## 2018-10-17 DIAGNOSIS — C50312 Malignant neoplasm of lower-inner quadrant of left female breast: Secondary | ICD-10-CM | POA: Diagnosis not present

## 2018-10-18 ENCOUNTER — Ambulatory Visit
Admission: RE | Admit: 2018-10-18 | Discharge: 2018-10-18 | Disposition: A | Discharge status: Home / Self Care | Payer: Medicare Other | Source: Ambulatory Visit | Attending: Radiation Oncology | Admitting: Radiation Oncology

## 2018-10-18 ENCOUNTER — Other Ambulatory Visit: Payer: Self-pay

## 2018-10-18 DIAGNOSIS — Z170 Estrogen receptor positive status [ER+]: Secondary | ICD-10-CM | POA: Diagnosis not present

## 2018-10-18 DIAGNOSIS — C50312 Malignant neoplasm of lower-inner quadrant of left female breast: Secondary | ICD-10-CM | POA: Diagnosis not present

## 2018-10-18 DIAGNOSIS — Z510 Encounter for antineoplastic radiation therapy: Secondary | ICD-10-CM | POA: Diagnosis not present

## 2018-10-21 ENCOUNTER — Ambulatory Visit
Admission: RE | Admit: 2018-10-21 | Discharge: 2018-10-21 | Disposition: A | Discharge status: Home / Self Care | Payer: Medicare Other | Source: Ambulatory Visit | Attending: Radiation Oncology | Admitting: Radiation Oncology

## 2018-10-21 ENCOUNTER — Other Ambulatory Visit: Payer: Self-pay

## 2018-10-21 ENCOUNTER — Other Ambulatory Visit: Payer: Self-pay | Admitting: Family Medicine

## 2018-10-21 DIAGNOSIS — Z170 Estrogen receptor positive status [ER+]: Secondary | ICD-10-CM | POA: Diagnosis not present

## 2018-10-21 DIAGNOSIS — Z510 Encounter for antineoplastic radiation therapy: Secondary | ICD-10-CM | POA: Diagnosis not present

## 2018-10-21 DIAGNOSIS — C50312 Malignant neoplasm of lower-inner quadrant of left female breast: Secondary | ICD-10-CM | POA: Diagnosis not present

## 2018-10-22 ENCOUNTER — Other Ambulatory Visit: Payer: Self-pay

## 2018-10-22 ENCOUNTER — Ambulatory Visit
Admission: RE | Admit: 2018-10-22 | Discharge: 2018-10-22 | Disposition: A | Discharge status: Home / Self Care | Payer: Medicare Other | Source: Ambulatory Visit | Attending: Radiation Oncology | Admitting: Radiation Oncology

## 2018-10-22 DIAGNOSIS — C50312 Malignant neoplasm of lower-inner quadrant of left female breast: Secondary | ICD-10-CM | POA: Diagnosis not present

## 2018-10-22 DIAGNOSIS — Z510 Encounter for antineoplastic radiation therapy: Secondary | ICD-10-CM | POA: Diagnosis not present

## 2018-10-22 DIAGNOSIS — Z170 Estrogen receptor positive status [ER+]: Secondary | ICD-10-CM | POA: Diagnosis not present

## 2018-10-23 ENCOUNTER — Other Ambulatory Visit: Payer: Self-pay

## 2018-10-23 ENCOUNTER — Ambulatory Visit
Admission: RE | Admit: 2018-10-23 | Discharge: 2018-10-23 | Disposition: A | Discharge status: Home / Self Care | Payer: Medicare Other | Source: Ambulatory Visit | Attending: Radiation Oncology | Admitting: Radiation Oncology

## 2018-10-23 DIAGNOSIS — Z170 Estrogen receptor positive status [ER+]: Secondary | ICD-10-CM | POA: Diagnosis not present

## 2018-10-23 DIAGNOSIS — C50312 Malignant neoplasm of lower-inner quadrant of left female breast: Secondary | ICD-10-CM | POA: Diagnosis not present

## 2018-10-23 DIAGNOSIS — Z510 Encounter for antineoplastic radiation therapy: Secondary | ICD-10-CM | POA: Diagnosis not present

## 2018-10-24 ENCOUNTER — Inpatient Hospital Stay: Payer: Medicare Other | Attending: Internal Medicine

## 2018-10-24 ENCOUNTER — Ambulatory Visit
Admission: RE | Admit: 2018-10-24 | Discharge: 2018-10-24 | Disposition: A | Discharge status: Home / Self Care | Payer: Medicare Other | Source: Ambulatory Visit | Attending: Radiation Oncology | Admitting: Radiation Oncology

## 2018-10-24 ENCOUNTER — Other Ambulatory Visit: Payer: Self-pay

## 2018-10-24 DIAGNOSIS — Z510 Encounter for antineoplastic radiation therapy: Secondary | ICD-10-CM | POA: Diagnosis not present

## 2018-10-24 DIAGNOSIS — Z801 Family history of malignant neoplasm of trachea, bronchus and lung: Secondary | ICD-10-CM | POA: Diagnosis not present

## 2018-10-24 DIAGNOSIS — Z170 Estrogen receptor positive status [ER+]: Secondary | ICD-10-CM | POA: Diagnosis not present

## 2018-10-24 DIAGNOSIS — Z87891 Personal history of nicotine dependence: Secondary | ICD-10-CM | POA: Insufficient documentation

## 2018-10-24 DIAGNOSIS — C50312 Malignant neoplasm of lower-inner quadrant of left female breast: Secondary | ICD-10-CM | POA: Diagnosis not present

## 2018-10-24 LAB — CBC
HCT: 45.4 % (ref 36.0–46.0)
Hemoglobin: 14 g/dL (ref 12.0–15.0)
MCH: 27.6 pg (ref 26.0–34.0)
MCHC: 30.8 g/dL (ref 30.0–36.0)
MCV: 89.5 fL (ref 80.0–100.0)
Platelets: 283 10*3/uL (ref 150–400)
RBC: 5.07 MIL/uL (ref 3.87–5.11)
RDW: 14 % (ref 11.5–15.5)
WBC: 8.6 10*3/uL (ref 4.0–10.5)
nRBC: 0 % (ref 0.0–0.2)

## 2018-10-25 ENCOUNTER — Ambulatory Visit
Admission: RE | Admit: 2018-10-25 | Discharge: 2018-10-25 | Disposition: A | Discharge status: Home / Self Care | Payer: Medicare Other | Source: Ambulatory Visit | Attending: Radiation Oncology | Admitting: Radiation Oncology

## 2018-10-25 ENCOUNTER — Other Ambulatory Visit: Payer: Self-pay

## 2018-10-25 DIAGNOSIS — Z510 Encounter for antineoplastic radiation therapy: Secondary | ICD-10-CM | POA: Diagnosis not present

## 2018-10-25 DIAGNOSIS — Z170 Estrogen receptor positive status [ER+]: Secondary | ICD-10-CM | POA: Diagnosis not present

## 2018-10-25 DIAGNOSIS — C50312 Malignant neoplasm of lower-inner quadrant of left female breast: Secondary | ICD-10-CM | POA: Diagnosis not present

## 2018-10-28 ENCOUNTER — Encounter: Payer: Self-pay | Admitting: Family Medicine

## 2018-10-28 ENCOUNTER — Other Ambulatory Visit: Payer: Self-pay

## 2018-10-28 ENCOUNTER — Ambulatory Visit: Payer: Medicare Other

## 2018-10-28 ENCOUNTER — Ambulatory Visit (INDEPENDENT_AMBULATORY_CARE_PROVIDER_SITE_OTHER): Payer: Medicare Other | Admitting: Family Medicine

## 2018-10-28 DIAGNOSIS — C50312 Malignant neoplasm of lower-inner quadrant of left female breast: Secondary | ICD-10-CM | POA: Diagnosis not present

## 2018-10-28 DIAGNOSIS — J449 Chronic obstructive pulmonary disease, unspecified: Secondary | ICD-10-CM | POA: Diagnosis not present

## 2018-10-28 DIAGNOSIS — Z20822 Contact with and (suspected) exposure to covid-19: Secondary | ICD-10-CM

## 2018-10-28 DIAGNOSIS — I209 Angina pectoris, unspecified: Secondary | ICD-10-CM | POA: Diagnosis not present

## 2018-10-28 DIAGNOSIS — Z170 Estrogen receptor positive status [ER+]: Secondary | ICD-10-CM

## 2018-10-28 MED ORDER — BENZONATATE 100 MG PO CAPS: 100.0000 mg | ORAL_CAPSULE | Freq: Two times a day (BID) | ORAL | 0 refills | Status: DC | PRN

## 2018-10-28 MED ORDER — AZITHROMYCIN 250 MG PO TABS: ORAL_TABLET | ORAL | 0 refills | Status: DC

## 2018-10-28 NOTE — Progress Notes (Signed)
There were no vitals taken for this visit.   Subjective:    Patient ID: Tammy [name], female    DOB: 24-Mar-1945, 73 y.o.   MRN: [id number]  HPI: Tammy [name] is a 73 y.o. female  Med check Discussed with patient said very stormy spring and summer with multiple surgeries undergoing radiation therapy now which was canceled today because of patient's cough.  Patient with no fever but is having cough shortness of breath and wheezing also some fatigue and tiredness. Has not had a COVID-19 test in the last little bit. Patient has had a history of getting bronchitis. Patient does have systemic symptoms of fatigue but on further review no fever loss of taste or smell or other COVID-19 symptoms.  Relevant past medical, surgical, family and social history reviewed and updated as indicated. Interim medical history since our last visit reviewed. Allergies and medications reviewed and updated.  Review of Systems  Constitutional: Positive for fatigue. Negative for fever.  Respiratory: Positive for cough, shortness of breath and wheezing.     Per HPI unless specifically indicated above     Objective:    There were no vitals taken for this visit.  Wt Readings from Last 3 Encounters:  10/08/18 213 lb 12.8 oz (97 kg)  10/07/18 215 lb (97.5 kg)  10/02/18 212 lb (96.2 kg)    Physical Exam  Results for orders placed or performed in visit on 10/24/18  CBC  Result Value Ref Range   WBC 8.6 4.0 - 10.5 K/uL   RBC 5.07 3.87 - 5.11 MIL/uL   Hemoglobin 14.0 12.0 - 15.0 g/dL   HCT 45.4 36.0 - 46.0 %   MCV 89.5 80.0 - 100.0 fL   MCH 27.6 26.0 - 34.0 pg   MCHC 30.8 30.0 - 36.0 g/dL   RDW 14.0 11.5 - 15.5 %   Platelets 283 150 - 400 K/uL   nRBC 0.0 0.0 - 0.2 %      Assessment & Plan:   Problem List Items Addressed This Visit      Respiratory   COPD (chronic obstructive pulmonary disease) ([place])    With exacerbation will call in a Z-Pak and Tessalon Perles patient will get a COVID-19 test.        Other   Carcinoma of lower-inner quadrant of left breast in female, estrogen receptor positive ([place])    Discussed radiation and patient's immune compromised state we will go ahead with antibiotics.         Reviewed patient's history of multiple medical problems and issues but has been stable.  Telemedicine using audio/video telecommunications for a synchronous communication visit. Today's visit due to COVID-19 isolation precautions I connected with and verified that I am speaking with the correct person using two identifiers.   I discussed the limitations, risks, security and privacy concerns of performing an evaluation and management service by telecommunication and the availability of in person appointments. I also discussed with the patient that there may be a patient responsible charge related to this service. The patient expressed understanding and agreed to proceed. The patient's location is home. I am at home.   I discussed the assessment and treatment plan with the patient. The patient was provided an opportunity to ask questions and all were answered. The patient agreed with the plan and demonstrated an understanding of the instructions.   The patient was advised to call back or seek an in-person evaluation if the symptoms worsen or if the condition fails  to improve as anticipated.   I provided 21+ minutes of time during this encounter. Follow up plan: No follow-ups on file.

## 2018-10-28 NOTE — Assessment & Plan Note (Signed)
Discussed radiation and patient's immune compromised state we will go ahead with antibiotics.

## 2018-10-28 NOTE — Assessment & Plan Note (Signed)
With exacerbation will call in a Z-Pak and Tessalon Perles patient will get a COVID-19 test.

## 2018-10-29 ENCOUNTER — Ambulatory Visit: Payer: Medicare Other

## 2018-10-30 ENCOUNTER — Ambulatory Visit: Payer: Medicare Other

## 2018-10-30 LAB — NOVEL CORONAVIRUS, NAA: SARS-CoV-2, NAA: NOT DETECTED

## 2018-10-31 ENCOUNTER — Other Ambulatory Visit: Payer: Self-pay

## 2018-10-31 ENCOUNTER — Ambulatory Visit
Admission: RE | Admit: 2018-10-31 | Discharge: 2018-10-31 | Disposition: A | Discharge status: Home / Self Care | Payer: Medicare Other | Source: Ambulatory Visit | Attending: Radiation Oncology | Admitting: Radiation Oncology

## 2018-10-31 DIAGNOSIS — C50312 Malignant neoplasm of lower-inner quadrant of left female breast: Secondary | ICD-10-CM | POA: Diagnosis not present

## 2018-10-31 DIAGNOSIS — Z510 Encounter for antineoplastic radiation therapy: Secondary | ICD-10-CM | POA: Diagnosis not present

## 2018-10-31 DIAGNOSIS — Z170 Estrogen receptor positive status [ER+]: Secondary | ICD-10-CM | POA: Diagnosis not present

## 2018-11-01 ENCOUNTER — Ambulatory Visit
Admission: RE | Admit: 2018-11-01 | Discharge: 2018-11-01 | Disposition: A | Discharge status: Home / Self Care | Payer: Medicare Other | Source: Ambulatory Visit | Attending: Radiation Oncology | Admitting: Radiation Oncology

## 2018-11-01 ENCOUNTER — Other Ambulatory Visit: Payer: Self-pay

## 2018-11-01 DIAGNOSIS — C50312 Malignant neoplasm of lower-inner quadrant of left female breast: Secondary | ICD-10-CM | POA: Diagnosis not present

## 2018-11-01 DIAGNOSIS — Z170 Estrogen receptor positive status [ER+]: Secondary | ICD-10-CM | POA: Diagnosis not present

## 2018-11-01 DIAGNOSIS — Z510 Encounter for antineoplastic radiation therapy: Secondary | ICD-10-CM | POA: Diagnosis not present

## 2018-11-04 ENCOUNTER — Ambulatory Visit
Admission: RE | Admit: 2018-11-04 | Discharge: 2018-11-04 | Disposition: A | Discharge status: Home / Self Care | Payer: Medicare Other | Source: Ambulatory Visit | Attending: Radiation Oncology | Admitting: Radiation Oncology

## 2018-11-04 ENCOUNTER — Other Ambulatory Visit: Payer: Self-pay

## 2018-11-04 DIAGNOSIS — C50312 Malignant neoplasm of lower-inner quadrant of left female breast: Secondary | ICD-10-CM | POA: Diagnosis not present

## 2018-11-04 DIAGNOSIS — Z510 Encounter for antineoplastic radiation therapy: Secondary | ICD-10-CM | POA: Diagnosis not present

## 2018-11-04 DIAGNOSIS — Z170 Estrogen receptor positive status [ER+]: Secondary | ICD-10-CM | POA: Diagnosis not present

## 2018-11-05 ENCOUNTER — Ambulatory Visit
Admission: RE | Admit: 2018-11-05 | Discharge: 2018-11-05 | Disposition: A | Discharge status: Home / Self Care | Payer: Medicare Other | Source: Ambulatory Visit | Attending: Radiation Oncology | Admitting: Radiation Oncology

## 2018-11-05 ENCOUNTER — Other Ambulatory Visit: Payer: Self-pay

## 2018-11-05 ENCOUNTER — Telehealth: Payer: Self-pay

## 2018-11-05 DIAGNOSIS — C50312 Malignant neoplasm of lower-inner quadrant of left female breast: Secondary | ICD-10-CM | POA: Diagnosis not present

## 2018-11-05 DIAGNOSIS — Z170 Estrogen receptor positive status [ER+]: Secondary | ICD-10-CM | POA: Diagnosis not present

## 2018-11-05 DIAGNOSIS — Z510 Encounter for antineoplastic radiation therapy: Secondary | ICD-10-CM | POA: Diagnosis not present

## 2018-11-06 ENCOUNTER — Ambulatory Visit: Payer: Medicare Other

## 2018-11-06 ENCOUNTER — Ambulatory Visit (INDEPENDENT_AMBULATORY_CARE_PROVIDER_SITE_OTHER): Payer: Medicare Other

## 2018-11-06 ENCOUNTER — Other Ambulatory Visit: Payer: Self-pay

## 2018-11-06 VITALS — Wt 213.0 lb

## 2018-11-06 DIAGNOSIS — Z0000 Encounter for general adult medical examination without abnormal findings: Secondary | ICD-10-CM

## 2018-11-06 DIAGNOSIS — C50312 Malignant neoplasm of lower-inner quadrant of left female breast: Secondary | ICD-10-CM | POA: Diagnosis not present

## 2018-11-06 DIAGNOSIS — Z510 Encounter for antineoplastic radiation therapy: Secondary | ICD-10-CM | POA: Diagnosis not present

## 2018-11-06 DIAGNOSIS — Z170 Estrogen receptor positive status [ER+]: Secondary | ICD-10-CM | POA: Diagnosis not present

## 2018-11-06 NOTE — Progress Notes (Signed)
Subjective:   Tammy [name] is a 73 y.o. female who presents for Medicare Annual (Subsequent) preventive examination.  This visit is being conducted via phone call  - after an attmept to do on video chat - due to the COVID-19 pandemic. This patient has given me verbal consent via phone to conduct this visit, patient states they are participating from their home address. Some vital signs may be absent or patient reported.   Patient identification: identified by name, DOB, and current address.    Review of Systems:   Cardiac Risk Factors include: advanced age (>51mn, >>31women);dyslipidemia;diabetes mellitus;hypertension;obesity (BMI >30kg/m2)     Objective:     Vitals: Wt 213 lb (96.6 kg) Comment: pt reported  BMI 34.38 kg/m   Body mass index is 34.38 kg/m.  Advanced Directives 11/06/2018 10/02/2018 08/26/2018 08/22/2018 03/11/2018 10/25/2016 12/10/2015  Does Patient Have a Medical Advance Directive? _0  No No  Would patient like information on creating a medical advance directive? - No - Patient declined No - Patient declined No - Patient declined - Yes (MAU/Ambulatory/Procedural Areas - Information given) No - patient declined information    Tobacco Social History   Tobacco Use  Smoking Status Former Smoker  . Packs/day: 0.50  . Years: 15.00  . Pack years: 7.50  . Types: Cigarettes  . Quit date: 03/14/2007  . Years since quitting: 11.6  Smokeless Tobacco Never Used     Counseling given: Not Answered   Clinical Intake:  Pre-visit preparation completed: Yes  Pain : No/denies pain     Nutritional Risks: None Diabetes: Yes CBG done?: No Did pt. bring in CBG monitor from home?: No  How often do you need to have someone help you when you read instructions, pamphlets, or other written materials from your doctor or pharmacy?: 1 - Never  Nutrition Risk Assessment:  Has the patient had any N/V/D within the last 2 months?  No  Does the patient have any  non-healing wounds?  No  Has the patient had any unintentional weight loss or weight gain?  No   Diabetes:  Is the patient diabetic?  Yes  If diabetic, was a CBG obtained today?  No  Did the patient bring in their glucometer from home?  No  How often do you monitor your CBG's? Daily   Financial Strains and Diabetes Management:  Are you having any financial strains with the device, your supplies or your medication? No .  Does the patient want to be seen by Chronic Care Management for management of their diabetes?  Yes   Already in contact with ccm team  Diabetic Exams:  Diabetic Eye Exam: Com[facility] jan 2020 [facility]    Diabetic Foot Exam: Completed 12/18/2017    Interpreter Needed?: No  Information entered by :: Corie Vavra,LPN  Past Medical History:  Diagnosis Date  . Asthma   . Benign neoplasm of colon   . Cervical disc disease    "bulging" - no limitations per pt  . Chronic diastolic CH[place]    a. 07/2012 Echo: Nl EF. mild inferoseptal HK, Gr 2 DD, mild conc LVH, mild PR/TR, mild PAH.  . CKD (chronic kidney disease), stage III [place]   . COPD (chronic obstructive pulmonary disease) ([place]   . Coronary artery disease    a. 11/2011 Cath: LAD 548m, LCX min irregs, RCA 70p, 10068mth L->R collats, EF 60%-->Med Rx.  . [place] mellitus [place] complication [place] .  Fusion of lumbar spine 12/22/2015  . GERD (gastroesophageal reflux disease)   . History of DVT (deep vein thrombosis)   . History of tobacco abuse    a. Quit 2011.  [name] Hyperlipidemia   . Hypertension   . Hypothyroidism   . Obesity   . Palpitations   . PE (pulmonary embolism) 10/15  . PONV (postoperative nausea and vomiting)   . Pulmonary embolus ([place]) 12/25/2013   RLL. Presented with SOB and elevated d-dimer.   [name] PVD (peripheral vascular disease) ([place])    a. PTA of right leg and left femoral artery with stenosis.  . Renal insufficiency   . Stroke [facility]) 2015   TIAs -  no deficits   Past Surgical History:  Procedure Laterality Date  . ANGIOPLASTY / STENTING FEMORAL     PVD; angioplasty right leg and left femoral artery with stenosis.   . APPENDECTOMY  Oct. 2016  . BACK SURGERY  03/2012  . BACK SURGERY     screws, rods replaced with new hardware.  [name] BACK SURGERY  11/2016  . CARDIAC CATHETERIZATION  12/07/2011   Mid LAD 50%, distal LAD 50%, mid RCA 70%, distal RCA 100%   . CARDIAC CATHETERIZATION  01/04/2011   100% occluded mid RCA with good collaterals from distal LAD, normal LVEF.  [name] CHOLECYSTECTOMY    . COLONOSCOPY  2013  . COLONOSCOPY WITH PROPOFOL N/A 05/24/2015   Procedure: COLONOSCOPY WITH PROPOFOL;  Surgeon: [name], MD;  Location: [facility];  Service: Endoscopy;  Laterality: N/A;  Diabetic - oral meds  . LAPAROSCOPIC APPENDECTOMY N/A 01/06/2015   Procedure: APPENDECTOMY LAPAROSCOPIC;  Surgeon: [name], MD;  Location: [facility];  Service: General;  Laterality: N/A;  . MASTECTOMY W/ SENTINEL NODE BIOPSY Left 08/26/2018   Procedure: PARTIAL MASTECTOMY WIDE EXCISION WITH SENTINEL LYMPH NODE BIOPSY LEFT;  Surgeon: [name], MD;  Location: [facility];  Service: General;  Laterality: Left;  . POLYPECTOMY  05/24/2015   Procedure: POLYPECTOMY;  Surgeon: [name], MD;  Location: [facility];  Service: Endoscopy;;  . THYROIDECTOMY    . TRIGGER FINGER RELEASE     Family History  Problem Relation Age of Onset  . Hypertension Father   . Heart disease Father   . Heart attack Father   . Diabetes Father   . Cancer Mother        colon  . Heart attack Brother   . Diabetes Brother   . Hypertension Brother   . Heart disease Brother   . Heart attack Brother   . Diabetes Brother   . Hypertension Brother   . Heart disease Brother   . Heart attack Brother   . Diabetes Brother   . Hypertension Brother   . Heart disease Brother   . Cancer Sister        lung  . COPD Sister   . COPD Sister   . Breast cancer  Neg Hx    Social History   Socioeconomic History  . Marital status: Married    Spouse name: Not on file  . Number of children: 2  . Years of education: some college, Production assistant, radio   . Highest education level: Associate degree: occupational, Hotel manager, or vocational program  Occupational History  . Occupation: Retired  Scientific laboratory technician  . Financial resource strain: Not hard at all  . Food insecurity    Worry: Never true    Inability: Never true  . Transportation needs  Medical: No    Non-medical: No  Tobacco Use  . Smoking status: Former Smoker    Packs/day: 0.50    Years: 15.00    Pack years: 7.50    Types: Cigarettes    Quit date: 03/14/2007    Years since quitting: 11.6  . Smokeless tobacco: Never Used  Substance and Sexual Activity  . Alcohol use: No    Alcohol/week: 0.0 standard drinks  . Drug use: No  . Sexual activity: Yes    Birth control/protection: Post-menopausal  Lifestyle  . Physical activity    Days per week: 0 days    Minutes per session: 0 min  . Stress: Not at all  Relationships  . Social connections    Talks on phone: More than three times a week    Gets together: More than three times a week    Attends religious service: 1 to 4 times per year    Active member of club or organization: No    Attends meetings of clubs or organizations: Never    Relationship status: Married  Other Topics Concern  . Not on file  Social History Narrative   Lives at home with husband in [place]. Quit smoking 10 years ago; never alcohol. 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     Outpatient Encounter Medications as of 11/06/2018  Medication Sig  . albuterol (PROAIR HFA) 108 (90 Base) MCG/ACT inhaler INHALE 2 PUFFS EVERY 4 HOURS AS NEEDED FOR WHEEZING OR SHORTNESS OF BREATH  . apixaban (ELIQUIS) 2.5 MG TABS tablet Take 1 tablet (2.5 mg) by mouth twice daily  . blood glucose meter kit and supplies KIT  Dispense based on patient and insurance preference. Use one time daily as directed, E11.9  . Dulaglutide (TRULICITY) 1.5 WP/7.9YI SOPN Inject 1.5 mg into the skin every Tuesday.  . DULoxetine (CYMBALTA) 60 MG capsule Take 1 capsule (60 mg total) by mouth daily.  [name] gabapentin (NEURONTIN) 300 MG capsule TAKE 1 CAPSULE BY MOUTH THREE TIMES DAILY  . glipiZIDE (GLUCOTROL) 5 MG tablet TAKE ONE TABLET EVERY DAY BEFORE BREAKFAST  . isosorbide mononitrate (IMDUR) 60 MG 24 hr tablet Take 1 tablet (60 mg total) by mouth as directed. Take 1 tablet at bedtime and 1/2 tablet in the morning (Patient taking differently: Take 30-60 mg by mouth See admin instructions. 30 mg in the morning, and 60 mg at bedtime)  . levothyroxine (SYNTHROID) 150 MCG tablet Take 1 tablet (150 mcg total) by mouth daily before breakfast.  . losartan (COZAAR) 25 MG tablet Take 25 mg by mouth daily.  . metoprolol succinate (TOPROL-XL) 50 MG 24 hr tablet TAKE 1 TABLET BY MOUTH DAILY WITH OR IMMEDIATLEY FOLLOWING A MEAL  . pantoprazole (PROTONIX) 40 MG tablet Take 1 tablet (40 mg total) by mouth daily as needed. (Patient taking differently: Take 40 mg by mouth daily. )  . pregabalin (LYRICA) 75 MG capsule 1 QHS x1 week, then increase to 1 BID for 1 week, if not too sleepy 2qHS and 1 during the day for 1 week, then 2 BID  . psyllium (METAMUCIL) 58.6 % packet Take 1 packet by mouth daily as needed (fiber).   . rosuvastatin (CRESTOR) 40 MG tablet TAKE 1 TABLET BY MOUTH DAILY  . trimethoprim (TRIMPEX) 100 MG tablet Take 1 tablet (100 mg total) by mouth daily.  . benzonatate (TESSALON) 100 MG capsule Take 1 capsule (100 mg total) by mouth 2 (two) times daily  as needed for cough. (Patient not taking: Reported on 11/06/2018)  . nitroGLYCERIN (NITROSTAT) 0.4 MG SL tablet Place 1 tablet (0.4 mg total) under the tongue every 5 (five) minutes as needed for chest pain. (Patient not taking: Reported on 11/06/2018)  . [DISCONTINUED] azithromycin (ZITHROMAX)  250 MG tablet 2 now then 1 a day (Patient not taking: Reported on 11/06/2018)  . [DISCONTINUED] penicillin v potassium (VEETID) 500 MG tablet Take 1 tablet (500 mg total) by mouth 4 (four) times daily. (Patient not taking: Reported on 11/06/2018)   No facility-administered encounter medications on file as of 11/06/2018.     Activities of Daily Living In your present state of health, do you have any difficulty performing the following activities: 11/06/2018 08/22/2018  Hearing? N -  Comment no hearing aids -  Vision? N -  Comment reading glasses, [facility] -  Difficulty concentrating or making decisions? N -  Walking or climbing stairs? Y Y  Comment - several back surgeries  Dressing or bathing? N -  Comment takes time, sits when needed -  Doing errands, shopping? N Y  Comment - hard to walk f/t back pain  Preparing Food and eating ? N -  Using the Toilet? N -  In the past six months, have you accidently leaked urine? N -  Do you have problems with loss of bowel control? N -  Managing your Medications? N -  Managing your Finances? N -  Housekeeping or managing your Housekeeping? Y -  Comment has someone clean home -  Some recent data might be hidden    Patient Care Team: [name], DO as PCP - General (Family Medicine) [name], [name], MD as Consulting Physician (Cardiology) [name], MD as Consulting Physician (Urology) [name], MD as Consulting Physician (Nephrology) [name], MD as Referring Physician (Orthopedic Surgery) [name], MD as Attending Physician (Endocrinology) [name], MD as Referring Physician (Vascular Surgery) De Hollingshead, [facility] as Pharmacist (Pharmacist)    Assessment:   This is a routine wellness examination for Rashi.  Exercise Activities and Dietary recommendations Current Exercise Habits: The patient does not participate in regular exercise at present, Exercise limited by: None identified  Goals    .  "My medications are expensive" (pt-stated)     Current Barriers:  . Financial barriers - patient expresses that copayment for Trulicity and Eliquis are both >$627 per month, Trulicity being >$035. . Trulicity - K0XF, uncontrolled, most recent A1c 8.0%, impacted by stress and recent surgeries;  . Eliquis - thromboembolism prevention, patient notes a hx of PE. Recent dose decrease by Dr. [name] to 2.5 mg BID for prevention dosing.   Pharmacist Clinical Goal(s):  [name] Over the next 90 days, patient will work with medical team to address needs related to optimized disease management  Interventions: . Comprehensive medication review performed. . Discussed patient assistance for Eliquis and Trulicity. Patient located her 2019 tax return; unfortunately, her household income is >400% of FLP, which puts her over the income limit for assistance for either of these products.  . Discussed that she could investigate alternative Medicare Part D plans for 2021 once Open Enrollment begins. Discussed [place] [facility] service that provides Medicare plan counseling for seniors - will provide this information to the patient.  Patient Self Care Activities:  . Self administers medications as prescribed . Calls pharmacy for medication refills  Initial goal documentation     . DIET - INCREASE WATER INTAKE  Recommend drinking at least 6-8 glasses of water a day        Fall Risk: Fall Risk  11/06/2018 08/29/2018 08/15/2018 08/13/2018 02/19/2018  Falls in the past year? 1 0 0 0 0  Number falls in past yr: 0 - - - -  Injury with Fall? 0 - - - -  Risk Factor Category  - - - - -  Risk for fall due to : History of fall(s);Impaired balance/gait - - - -  Follow up Education provided - Falls evaluation completed Falls evaluation completed Falls evaluation completed    [place]:  Any stairs in or around the home? Yes  If so, are there any without handrails? No   Home free of loose throw  rugs in walkways, pet beds, electrical cords, etc? Yes  Adequate lighting in your home to reduce risk of falls? Yes   ASSISTIVE DEVICES UTILIZED TO PREVENT FALLS:  Life alert? No  Use of a cane, walker or w/c? Yes  cane, walker if needed  Grab bars in the bathroom? Yes  Shower chair or bench in shower? Yes  Elevated toilet seat or a handicapped toilet? Yes   TIMED UP AND GO:  Unable to perform    Depression Screen PHQ 2/9 Scores 11/06/2018 10/31/2017 10/25/2016 12/10/2015  PHQ - 2 Score 0 0 0 0     Cognitive Function     6CIT Screen 10/31/2017 10/25/2016  What Year? 0 points 0 points  What month? 0 points 0 points  What time? 0 points 0 points  Count back from 20 0 points 0 points  Months in reverse 0 points 0 points  Repeat phrase 0 points 0 points  Total Score 0 0    Immunization History  Administered Date(s) Administered  . Influenza, High Dose Seasonal PF 11/16/2016, 12/18/2017  . Influenza,inj,Quad PF,6+ Mos 12/16/2014, 11/24/2015  . Pneumococcal Conjugate-13 07/08/2014  . Pneumococcal Polysaccharide-23 03/13/2012  . Zoster 03/13/2010    Qualifies for Shingles Vaccine? Yes  Zostavax completed n/a. Due for Shingrix. Education has been provided regarding the importance of this vaccine. Pt has been advised to call insurance company to determine out of pocket expense. Advised may also receive vaccine at local pharmacy or Health Dept. Verbalized acceptance and understanding.  Tdap: Discussed need for TD/TDAP vaccine, patient verbalized understanding that this is not covered as a preventative with there insurance and to call the office if she develops any new skin injuries, ie: cuts, scrapes, bug bites, or open wounds.  Flu Vaccine: Due now   Pneumococcal Vaccine: completed series   Screening Tests Health Maintenance  Topic Date Due  . INFLUENZA VACCINE  10/12/2018  . FOOT EXAM  12/19/2018  . HEMOGLOBIN A1C  04/03/2019  . COLONOSCOPY  05/23/2020  . Hepatitis C  Screening  Completed  . PNA vac Low Risk Adult  Completed  . DEXA SCAN  Addressed  . MAMMOGRAM  Discontinued  . OPHTHALMOLOGY EXAM  Discontinued  . TETANUS/TDAP  Discontinued    Cancer Screenings:  Colorectal Screening: Completed 05/24/2015. Repeat every 5 years  Mammogram: Completed 08/07/2018.  Bone Density: Completed 04/19/2005.  Lung Cancer Screening: (Low Dose CT Chest recommended if Age 18-80 years, 30 pack-year currently smoking OR have quit w/in 15years.) does not qualify.   Additional Screening:  Hepatitis C Screening: does qualify; Completed 07/14/2016  Dental Screening: Recommended annual dental exams for proper oral hygiene   Community Resource Referral:  CRR required this visit?  No  Plan:  I have personally reviewed and addressed the Medicare Annual Wellness questionnaire and have noted the following in the patient's chart:  A. Medical and social history B. Use of alcohol, tobacco or illicit drugs  C. Current medications and supplements D. Functional ability and status E.  Nutritional status F.  Physical activity G. Advance directives H. List of other physicians I.  Hospitalizations, surgeries, and ER visits in previous 12 months J.  [place] such as hearing and vision if needed, cognitive and depression L. Referrals and appointments   In addition, I have reviewed and discussed with patient certain preventive protocols, quality metrics, and best practice recommendations. A written personalized care plan for preventive services as well as general preventive health recommendations were provided to patient.   Signed,    Bevelyn Ngo, LPN  5/33/1740 Nurse Health Advisor   Nurse Notes: none

## 2018-11-06 NOTE — Patient Instructions (Signed)
[name] , Thank you for taking time to come for your Medicare Wellness Visit. I appreciate your ongoing commitment to your health goals. Please review the following plan we discussed and let me know if I can assist you in the future.   Screening recommendations/referrals: Colonoscopy: completed 2017 Mammogram: completed 07/2018 Bone Density: completed  Recommended yearly ophthalmology/optometry visit for glaucoma screening and checkup Recommended yearly dental visit for hygiene and checkup  Vaccinations: Influenza vaccine: due now Pneumococcal vaccine: up to date  Tdap vaccine: due now, check with  Shingles vaccine: shingrix eligible   Advanced directives: please pick up a copy of this information at your next office visit   Conditions/risks identified: diabetic- in contact with chronic care management team  Next appointment: follow up in one year for your annual wellness visit    Preventive Care 75 Years and Older, Female Preventive care refers to lifestyle choices and visits with your health care provider that can promote health and wellness. What does preventive care include?  A yearly physical exam. This is also called an annual well check.  Dental exams once or twice a year.  Routine eye exams. Ask your health care provider how often you should have your eyes checked.  Personal lifestyle choices, including:  Daily care of your teeth and gums.  Regular physical activity.  Eating a healthy diet.  Avoiding tobacco and drug use.  Limiting alcohol use.  Practicing safe sex.  Taking low-dose aspirin every day.  Taking vitamin and mineral supplements as recommended by your health care provider. What happens during an annual well check? The services and screenings done by your health care provider during your annual well check will depend on your age, overall health, lifestyle risk factors, and family history of disease. Counseling  Your health care provider may ask  you questions about your:  Alcohol use.  Tobacco use.  Drug use.  Emotional well-being.  Home and relationship well-being.  Sexual activity.  Eating habits.  History of falls.  Memory and ability to understand (cognition).  Work and work Statistician.  Reproductive health. Screening  You may have the following tests or measurements:  Height, weight, and BMI.  Blood pressure.  Lipid and cholesterol levels. These may be checked every 5 years, or more frequently if you are over 75 years old.  Skin check.  Lung cancer screening. You may have this screening every year starting at age 75 if you have a 30-pack-year history of smoking and currently smoke or have quit within the past 15 years.  Fecal occult blood test (FOBT) of the stool. You may have this test every year starting at age 75.  Flexible sigmoidoscopy or colonoscopy. You may have a sigmoidoscopy every 5 years or a colonoscopy every 10 years starting at age 75.  Hepatitis C blood test.  Hepatitis B blood test.  Sexually transmitted disease (STD) testing.  Diabetes screening. This is done by checking your blood sugar (glucose) after you have not eaten for a while (fasting). You may have this done every 1-3 years.  Bone density scan. This is done to screen for osteoporosis. You may have this done starting at age 75.  Mammogram. This may be done every 1-2 years. Talk to your health care provider about how often you should have regular mammograms. Talk with your health care provider about your test results, treatment options, and if necessary, the need for more tests. Vaccines  Your health care provider may recommend certain vaccines, such as:  Influenza  vaccine. This is recommended every year.  Tetanus, diphtheria, and acellular pertussis (Tdap, Td) vaccine. You may need a Td booster every 10 years.  Zoster vaccine. You may need this after age 75.  Pneumococcal 13-valent conjugate (PCV13) vaccine. One dose  is recommended after age 75.  Pneumococcal polysaccharide (PPSV23) vaccine. One dose is recommended after age 75. Talk to your health care provider about which screenings and vaccines you need and how often you need them. This information is not intended to replace advice given to you by your health care provider. Make sure you discuss any questions you have with your health care provider. Document Released: 03/26/2015 Document Revised: 11/17/2015 Document Reviewed: 12/29/2014 Elsevier Interactive Patient Education  2017 [place] Prevention in the Home Falls can cause injuries. They can happen to people of all ages. There are many things you can do to make your home safe and to help prevent falls. What can I do on the outside of my home?  Regularly fix the edges of walkways and driveways and fix any cracks.  Remove anything that might make you trip as you walk through a door, such as a raised step or threshold.  Trim any bushes or trees on the path to your home.  Use bright outdoor lighting.  Clear any walking paths of anything that might make someone trip, such as rocks or tools.  Regularly check to see if handrails are loose or broken. Make sure that both sides of any steps have handrails.  Any raised decks and porches should have guardrails on the edges.  Have any leaves, snow, or ice cleared regularly.  Use sand or salt on walking paths during winter.  Clean up any spills in your garage right away. This includes oil or grease spills. What can I do in the bathroom?  Use night lights.  Install grab bars by the toilet and in the tub and shower. Do not use towel bars as grab bars.  Use non-skid mats or decals in the tub or shower.  If you need to sit down in the shower, use a plastic, non-slip stool.  Keep the floor dry. Clean up any water that spills on the floor as soon as it happens.  Remove soap buildup in the tub or shower regularly.  Attach bath mats  securely with double-sided non-slip rug tape.  Do not have throw rugs and other things on the floor that can make you trip. What can I do in the bedroom?  Use night lights.  Make sure that you have a light by your bed that is easy to reach.  Do not use any sheets or blankets that are too big for your bed. They should not hang down onto the floor.  Have a firm chair that has side arms. You can use this for support while you get dressed.  Do not have throw rugs and other things on the floor that can make you trip. What can I do in the kitchen?  Clean up any spills right away.  Avoid walking on wet floors.  Keep items that you use a lot in easy-to-reach places.  If you need to reach something above you, use a strong step stool that has a grab bar.  Keep electrical cords out of the way.  Do not use floor polish or wax that makes floors slippery. If you must use wax, use non-skid floor wax.  Do not have throw rugs and other things on the floor that can  make you trip. What can I do with my stairs?  Do not leave any items on the stairs.  Make sure that there are handrails on both sides of the stairs and use them. Fix handrails that are broken or loose. Make sure that handrails are as long as the stairways.  Check any carpeting to make sure that it is firmly attached to the stairs. Fix any carpet that is loose or worn.  Avoid having throw rugs at the top or bottom of the stairs. If you do have throw rugs, attach them to the floor with carpet tape.  Make sure that you have a light switch at the top of the stairs and the bottom of the stairs. If you do not have them, ask someone to add them for you. What else can I do to help prevent falls?  Wear shoes that:  Do not have high heels.  Have rubber bottoms.  Are comfortable and fit you well.  Are closed at the toe. Do not wear sandals.  If you use a stepladder:  Make sure that it is fully opened. Do not climb a closed  stepladder.  Make sure that both sides of the stepladder are locked into place.  Ask someone to hold it for you, if possible.  Clearly mark and make sure that you can see:  Any grab bars or handrails.  First and last steps.  Where the edge of each step is.  Use tools that help you move around (mobility aids) if they are needed. These include:  Canes.  Walkers.  Scooters.  Crutches.  Turn on the lights when you go into a dark area. Replace any light bulbs as soon as they burn out.  Set up your furniture so you have a clear path. Avoid moving your furniture around.  If any of your floors are uneven, fix them.  If there are any pets around you, be aware of where they are.  Review your medicines with your doctor. Some medicines can make you feel dizzy. This can increase your chance of falling. Ask your doctor what other things that you can do to help prevent falls. This information is not intended to replace advice given to you by your health care provider. Make sure you discuss any questions you have with your health care provider. Document Released: 12/24/2008 Document Revised: 08/05/2015 Document Reviewed: 04/03/2014 Elsevier Interactive Patient Education  2017 [employer].

## 2018-11-07 ENCOUNTER — Other Ambulatory Visit: Payer: Self-pay

## 2018-11-07 ENCOUNTER — Inpatient Hospital Stay: Payer: Medicare Other

## 2018-11-07 ENCOUNTER — Ambulatory Visit: Payer: Medicare Other

## 2018-11-07 DIAGNOSIS — Z170 Estrogen receptor positive status [ER+]: Secondary | ICD-10-CM | POA: Diagnosis not present

## 2018-11-07 DIAGNOSIS — Z510 Encounter for antineoplastic radiation therapy: Secondary | ICD-10-CM | POA: Diagnosis not present

## 2018-11-07 DIAGNOSIS — Z801 Family history of malignant neoplasm of trachea, bronchus and lung: Secondary | ICD-10-CM | POA: Diagnosis not present

## 2018-11-07 DIAGNOSIS — C50312 Malignant neoplasm of lower-inner quadrant of left female breast: Secondary | ICD-10-CM

## 2018-11-07 DIAGNOSIS — Z87891 Personal history of nicotine dependence: Secondary | ICD-10-CM | POA: Diagnosis not present

## 2018-11-07 LAB — CBC
HCT: 43.3 % (ref 36.0–46.0)
Hemoglobin: 13.5 g/dL (ref 12.0–15.0)
MCH: 28 pg (ref 26.0–34.0)
MCHC: 31.2 g/dL (ref 30.0–36.0)
MCV: 89.6 fL (ref 80.0–100.0)
Platelets: 234 10*3/uL (ref 150–400)
RBC: 4.83 MIL/uL (ref 3.87–5.11)
RDW: 14.3 % (ref 11.5–15.5)
WBC: 8.7 10*3/uL (ref 4.0–10.5)
nRBC: 0 % (ref 0.0–0.2)

## 2018-11-08 ENCOUNTER — Ambulatory Visit
Admission: RE | Admit: 2018-11-08 | Discharge: 2018-11-08 | Disposition: A | Discharge status: Home / Self Care | Payer: Medicare Other | Source: Ambulatory Visit | Attending: Radiation Oncology | Admitting: Radiation Oncology

## 2018-11-08 ENCOUNTER — Other Ambulatory Visit: Payer: Self-pay

## 2018-11-08 ENCOUNTER — Ambulatory Visit (INDEPENDENT_AMBULATORY_CARE_PROVIDER_SITE_OTHER): Payer: Medicare Other | Admitting: Pharmacist

## 2018-11-08 ENCOUNTER — Ambulatory Visit: Payer: Medicare Other

## 2018-11-08 DIAGNOSIS — E1165 Type 2 diabetes mellitus with hyperglycemia: Secondary | ICD-10-CM | POA: Diagnosis not present

## 2018-11-08 DIAGNOSIS — E118 Type 2 diabetes mellitus with unspecified complications: Secondary | ICD-10-CM

## 2018-11-08 DIAGNOSIS — E782 Mixed hyperlipidemia: Secondary | ICD-10-CM | POA: Diagnosis not present

## 2018-11-08 DIAGNOSIS — C50312 Malignant neoplasm of lower-inner quadrant of left female breast: Secondary | ICD-10-CM

## 2018-11-08 DIAGNOSIS — I25111 Atherosclerotic heart disease of native coronary artery with angina pectoris with documented spasm: Secondary | ICD-10-CM

## 2018-11-08 DIAGNOSIS — Z170 Estrogen receptor positive status [ER+]: Secondary | ICD-10-CM | POA: Diagnosis not present

## 2018-11-08 DIAGNOSIS — IMO0002 Reserved for concepts with insufficient information to code with codable children: Secondary | ICD-10-CM

## 2018-11-08 DIAGNOSIS — Z510 Encounter for antineoplastic radiation therapy: Secondary | ICD-10-CM | POA: Diagnosis not present

## 2018-11-08 NOTE — Patient Instructions (Signed)
Visit Information  Goals Addressed            This Visit's Progress     Patient Stated   . "I want to keep feeling good" (pt-stated)       Current Barriers:  . Polypharmacy; complex patient with multiple comorbidities including: o Breast cancer, ER +; undergoing near-daily radiation therapy through 11/21/2018; Hx PE, tx Eliquis 2.5 mg BID o Sciatic nerve neuralgia; duloxetine 60 mg daily, gabapentin 300 mg QAM, 600 mg QPM; and pregabalin 150 mg BID o HTN, PAD, abdominal aortic atherosclerosis: losartan 25 mg QPM, metoprolol succinate 50 mg QPM; rosuvastatin 40 mg daily; does not check BP at home, but generally well controlled at goal <140/90 at recent office visits; LDL at goal <70; last seen by Dr. Lucky Cowboy 04/7251 o G6YQ: Trulicity 1.5 mg daily, glipizide 5 mg QAM; A1c elevated at 8% on last check; reports BG are remaining well controlled  o UTI ppx: trimethoprim 100 mg daily; last seen by Dr. Elenor Quinones 02/2018  o COPD: tx albuterol HFA PRN; notes that she had a bout of bronchitis in the past month, tx w/ azithromycin and benzonatate. Reports improvement in breathing. Now only needing albuterol before strenuous activity.  . Self-manages medications. Denies any questions or concerns today . Notes that she is tired, but overall tolerating radiation therapy well. Has plans with her family for Labor Day that she is looking forward to  Pharmacist Clinical Goal(s):  [name] Over the next 90 days, patient will work with PharmD and provider towards optimized medication management  Interventions: . Comprehensive medication review performed; medication list updated in electronic medical record . Reviewed recent visit notes from other specialties. . Reviewed fill history. Patient up to date on fills for losartan and rosuvastatin. . Counseled patient to continue to keep an eye on BG . Congratulated on almost being done with this round of radiation therapy  Patient Self Care Activities:  . Patient will take  medications as prescribed . Patient will contact provider or CCM team with any questions  Initial goal documentation     . COMPLETED: "My medications are expensive" (pt-stated)       Current Barriers:  . Financial barriers - patient expresses that copayment for Trulicity and Eliquis are both >$034 per month, Trulicity being >$742. . Today, patient reports that she has hit the Catastrophic Phase of her Medicare prescription coverage, and copays for all her medications are much more affordable  Pharmacist Clinical Goal(s):  [name] Over the next 90 days, patient will work with medical team to address needs related to optimized disease management  Interventions: . Denies any medication access needs at this time.   Patient Self Care Activities:  . Self administers medications as prescribed . Calls pharmacy for medication refills  Please see past updates related to this goal by clicking on the "Past Updates" button in the selected goal         The patient verbalized understanding of instructions provided today and declined a print copy of patient instruction materials.    Plan:  - Will outreach patient in 6-8 weeks for continued medication management support - Patient has my contact information for any questions or concerns in the interim  Catie Darnelle Maffucci, PharmD Clinical Pharmacist [place] [phone]

## 2018-11-08 NOTE — Chronic Care Management (AMB) (Signed)
Chronic Care Management   Follow Up Note   11/08/2018 Name: Tammy [name] MRN: [id number] DOB: 1945/05/30  Referred by: [name], DO Reason for referral : Chronic Care Management (Medication Management)   Tammy [name] is a 73 y.o. year old female who is a primary care patient of [name], DO. The CCM team was consulted for assistance with chronic disease management and care coordination needs.    Contacted patient telephonically for medication management support.   Review of patient status, including review of consultants reports, relevant laboratory and other test results, and collaboration with appropriate care team members and the patient's provider was performed as part of comprehensive patient evaluation and provision of chronic care management services.    SDOH (Social Determinants of Health) screening performed today: Financial Strain . See Care Plan for related entries.   Outpatient Encounter Medications as of 11/08/2018  Medication Sig Note  . albuterol (PROAIR HFA) 108 (90 Base) MCG/ACT inhaler INHALE 2 PUFFS EVERY 4 HOURS AS NEEDED FOR WHEEZING OR SHORTNESS OF BREATH 11/08/2018: Using PRN activity  . apixaban (ELIQUIS) 2.5 MG TABS tablet Take 1 tablet (2.5 mg) by mouth twice daily   . blood glucose meter kit and supplies KIT Dispense based on patient and insurance preference. Use one time daily as directed, E11.9   . Dulaglutide (TRULICITY) 1.5 FW/2.6VZ SOPN Inject 1.5 mg into the skin every Tuesday.   . DULoxetine (CYMBALTA) 60 MG capsule Take 1 capsule (60 mg total) by mouth daily.   [name] gabapentin (NEURONTIN) 300 MG capsule TAKE 1 CAPSULE BY MOUTH THREE TIMES DAILY 11/08/2018: 1 QAM, 2 QPM  . glipiZIDE (GLUCOTROL) 5 MG tablet TAKE ONE TABLET EVERY DAY BEFORE BREAKFAST   . isosorbide mononitrate (IMDUR) 60 MG 24 hr tablet Take 1 tablet (60 mg total) by mouth as directed. Take 1 tablet at bedtime and 1/2 tablet in the morning (Patient taking differently: Take 30-60 mg  by mouth See admin instructions. 30 mg in the morning, and 60 mg at bedtime)   . levothyroxine (SYNTHROID) 150 MCG tablet Take 1 tablet (150 mcg total) by mouth daily before breakfast.   . losartan (COZAAR) 25 MG tablet Take 25 mg by mouth daily. 11/08/2018: QPM  . metoprolol succinate (TOPROL-XL) 50 MG 24 hr tablet TAKE 1 TABLET BY MOUTH DAILY WITH OR IMMEDIATLEY FOLLOWING A MEAL 11/08/2018: QPM  . pantoprazole (PROTONIX) 40 MG tablet Take 1 tablet (40 mg total) by mouth daily as needed. (Patient taking differently: Take 40 mg by mouth daily. )   . pregabalin (LYRICA) 75 MG capsule 1 QHS x1 week, then increase to 1 BID for 1 week, if not too sleepy 2qHS and 1 during the day for 1 week, then 2 BID 11/08/2018: 2 BID  . psyllium (METAMUCIL) 58.6 % packet Take 1 packet by mouth daily as needed (fiber).    . rosuvastatin (CRESTOR) 40 MG tablet TAKE 1 TABLET BY MOUTH DAILY   . trimethoprim (TRIMPEX) 100 MG tablet Take 1 tablet (100 mg total) by mouth daily.   . nitroGLYCERIN (NITROSTAT) 0.4 MG SL tablet Place 1 tablet (0.4 mg total) under the tongue every 5 (five) minutes as needed for chest pain. (Patient not taking: Reported on 11/06/2018)   . [DISCONTINUED] benzonatate (TESSALON) 100 MG capsule Take 1 capsule (100 mg total) by mouth 2 (two) times daily as needed for cough. (Patient not taking: Reported on 11/08/2018)    No facility-administered encounter medications on file as of 11/08/2018.  Goals Addressed            This Visit's Progress     Patient Stated   . "I want to keep feeling good" (pt-stated)       Current Barriers:  . Polypharmacy; complex patient with multiple comorbidities including: o Breast cancer, ER +; undergoing near-daily radiation therapy through 11/21/2018; Hx PE, tx Eliquis 2.5 mg BID o Sciatic nerve neuralgia; duloxetine 60 mg daily, gabapentin 300 mg QAM, 600 mg QPM; and pregabalin 150 mg BID o HTN, PAD, abdominal aortic atherosclerosis: losartan 25 mg QPM, metoprolol  succinate 50 mg QPM; rosuvastatin 40 mg daily; does not check BP at home, but generally well controlled at goal <140/90 at recent office visits; LDL at goal <70; last seen by Dr. Lucky Cowboy 10/7562 o P3IR: Trulicity 1.5 mg daily, glipizide 5 mg QAM; A1c elevated at 8% on last check; reports BG are remaining well controlled  o UTI ppx: trimethoprim 100 mg daily; last seen by Dr. Elenor Quinones 02/2018  o COPD: tx albuterol HFA PRN; notes that she had a bout of bronchitis in the past month, tx w/ azithromycin and benzonatate. Reports improvement in breathing. Now only needing albuterol before strenuous activity.  . Self-manages medications. Denies any questions or concerns today . Notes that she is tired, but overall tolerating radiation therapy well. Has plans with her family for Labor Day that she is looking forward to  Pharmacist Clinical Goal(s):  [name] Over the next 90 days, patient will work with PharmD and provider towards optimized medication management  Interventions: . Comprehensive medication review performed; medication list updated in electronic medical record . Reviewed recent visit notes from other specialties. . Counseled patient to continue to keep an eye on BG . Congratulated on almost being done with this round of radiation therapy  Patient Self Care Activities:  . Patient will take medications as prescribed . Patient will contact provider or CCM team with any questions  Initial goal documentation     . COMPLETED: "My medications are expensive" (pt-stated)       Current Barriers:  . Financial barriers - patient expresses that copayment for Trulicity and Eliquis are both >$518 per month, Trulicity being >$841. . Today, patient reports that she has hit the Catastrophic Phase of her Medicare prescription coverage, and copays for all her medications are much more affordable  Pharmacist Clinical Goal(s):  [name] Over the next 90 days, patient will work with medical team to address needs related to  optimized disease management  Interventions: . Denies any medication access needs at this time.   Patient Self Care Activities:  . Self administers medications as prescribed . Calls pharmacy for medication refills  Please see past updates related to this goal by clicking on the "Past Updates" button in the selected goal          Plan:  - Will outreach patient in 6-8 weeks for continued medication management support - Patient has my contact information for any questions or concerns in the interim  Catie Darnelle Maffucci, PharmD Clinical Pharmacist [place] [phone]

## 2018-11-11 ENCOUNTER — Other Ambulatory Visit: Payer: Self-pay

## 2018-11-11 ENCOUNTER — Ambulatory Visit: Payer: Medicare Other

## 2018-11-11 DIAGNOSIS — Z510 Encounter for antineoplastic radiation therapy: Secondary | ICD-10-CM | POA: Diagnosis not present

## 2018-11-11 DIAGNOSIS — Z170 Estrogen receptor positive status [ER+]: Secondary | ICD-10-CM | POA: Diagnosis not present

## 2018-11-11 DIAGNOSIS — C50312 Malignant neoplasm of lower-inner quadrant of left female breast: Secondary | ICD-10-CM | POA: Diagnosis not present

## 2018-11-12 ENCOUNTER — Other Ambulatory Visit: Payer: Self-pay

## 2018-11-12 ENCOUNTER — Ambulatory Visit
Admission: RE | Admit: 2018-11-12 | Discharge: 2018-11-12 | Disposition: A | Discharge status: Home / Self Care | Payer: Medicare Other | Source: Ambulatory Visit | Attending: Radiation Oncology | Admitting: Radiation Oncology

## 2018-11-12 DIAGNOSIS — C50312 Malignant neoplasm of lower-inner quadrant of left female breast: Secondary | ICD-10-CM | POA: Insufficient documentation

## 2018-11-12 DIAGNOSIS — Z510 Encounter for antineoplastic radiation therapy: Secondary | ICD-10-CM | POA: Diagnosis not present

## 2018-11-12 DIAGNOSIS — Z170 Estrogen receptor positive status [ER+]: Secondary | ICD-10-CM | POA: Insufficient documentation

## 2018-11-13 ENCOUNTER — Ambulatory Visit
Admission: RE | Admit: 2018-11-13 | Discharge: 2018-11-13 | Disposition: A | Discharge status: Home / Self Care | Payer: Medicare Other | Source: Ambulatory Visit | Attending: Radiation Oncology | Admitting: Radiation Oncology

## 2018-11-13 ENCOUNTER — Other Ambulatory Visit: Payer: Self-pay

## 2018-11-13 DIAGNOSIS — C50312 Malignant neoplasm of lower-inner quadrant of left female breast: Secondary | ICD-10-CM | POA: Diagnosis not present

## 2018-11-13 DIAGNOSIS — Z510 Encounter for antineoplastic radiation therapy: Secondary | ICD-10-CM | POA: Diagnosis not present

## 2018-11-13 DIAGNOSIS — Z170 Estrogen receptor positive status [ER+]: Secondary | ICD-10-CM | POA: Diagnosis not present

## 2018-11-14 ENCOUNTER — Ambulatory Visit
Admission: RE | Admit: 2018-11-14 | Discharge: 2018-11-14 | Disposition: A | Discharge status: Home / Self Care | Payer: Medicare Other | Source: Ambulatory Visit | Attending: Radiation Oncology | Admitting: Radiation Oncology

## 2018-11-14 ENCOUNTER — Other Ambulatory Visit: Payer: Self-pay

## 2018-11-14 ENCOUNTER — Other Ambulatory Visit: Payer: Self-pay | Admitting: Family Medicine

## 2018-11-14 DIAGNOSIS — Z510 Encounter for antineoplastic radiation therapy: Secondary | ICD-10-CM | POA: Diagnosis not present

## 2018-11-14 DIAGNOSIS — Z170 Estrogen receptor positive status [ER+]: Secondary | ICD-10-CM | POA: Diagnosis not present

## 2018-11-14 DIAGNOSIS — C50312 Malignant neoplasm of lower-inner quadrant of left female breast: Secondary | ICD-10-CM | POA: Diagnosis not present

## 2018-11-15 ENCOUNTER — Other Ambulatory Visit: Payer: Self-pay

## 2018-11-15 ENCOUNTER — Ambulatory Visit: Payer: Medicare Other

## 2018-11-15 ENCOUNTER — Ambulatory Visit
Admission: RE | Admit: 2018-11-15 | Discharge: 2018-11-15 | Disposition: A | Discharge status: Home / Self Care | Payer: Medicare Other | Source: Ambulatory Visit | Attending: Radiation Oncology | Admitting: Radiation Oncology

## 2018-11-15 DIAGNOSIS — Z170 Estrogen receptor positive status [ER+]: Secondary | ICD-10-CM | POA: Diagnosis not present

## 2018-11-15 DIAGNOSIS — Z510 Encounter for antineoplastic radiation therapy: Secondary | ICD-10-CM | POA: Diagnosis not present

## 2018-11-15 DIAGNOSIS — C50312 Malignant neoplasm of lower-inner quadrant of left female breast: Secondary | ICD-10-CM | POA: Diagnosis not present

## 2018-11-19 ENCOUNTER — Other Ambulatory Visit: Payer: Self-pay

## 2018-11-19 ENCOUNTER — Ambulatory Visit
Admission: RE | Admit: 2018-11-19 | Discharge: 2018-11-19 | Disposition: A | Discharge status: Home / Self Care | Payer: Medicare Other | Source: Ambulatory Visit | Attending: Radiation Oncology | Admitting: Radiation Oncology

## 2018-11-19 DIAGNOSIS — Z510 Encounter for antineoplastic radiation therapy: Secondary | ICD-10-CM | POA: Diagnosis not present

## 2018-11-19 DIAGNOSIS — Z170 Estrogen receptor positive status [ER+]: Secondary | ICD-10-CM | POA: Diagnosis not present

## 2018-11-19 DIAGNOSIS — C50312 Malignant neoplasm of lower-inner quadrant of left female breast: Secondary | ICD-10-CM | POA: Diagnosis not present

## 2018-11-20 ENCOUNTER — Other Ambulatory Visit: Payer: Self-pay

## 2018-11-20 ENCOUNTER — Ambulatory Visit: Payer: Medicare Other

## 2018-11-20 ENCOUNTER — Ambulatory Visit
Admission: RE | Admit: 2018-11-20 | Discharge: 2018-11-20 | Disposition: A | Discharge status: Home / Self Care | Payer: Medicare Other | Source: Ambulatory Visit | Attending: Radiation Oncology | Admitting: Radiation Oncology

## 2018-11-20 DIAGNOSIS — Z510 Encounter for antineoplastic radiation therapy: Secondary | ICD-10-CM | POA: Diagnosis not present

## 2018-11-20 DIAGNOSIS — C50312 Malignant neoplasm of lower-inner quadrant of left female breast: Secondary | ICD-10-CM | POA: Diagnosis not present

## 2018-11-20 DIAGNOSIS — Z170 Estrogen receptor positive status [ER+]: Secondary | ICD-10-CM | POA: Diagnosis not present

## 2018-11-21 ENCOUNTER — Ambulatory Visit
Admission: RE | Admit: 2018-11-21 | Discharge: 2018-11-21 | Disposition: A | Discharge status: Home / Self Care | Payer: Medicare Other | Source: Ambulatory Visit | Attending: Radiation Oncology | Admitting: Radiation Oncology

## 2018-11-21 ENCOUNTER — Other Ambulatory Visit: Payer: Self-pay

## 2018-11-21 DIAGNOSIS — C50312 Malignant neoplasm of lower-inner quadrant of left female breast: Secondary | ICD-10-CM | POA: Diagnosis not present

## 2018-11-21 DIAGNOSIS — Z170 Estrogen receptor positive status [ER+]: Secondary | ICD-10-CM | POA: Diagnosis not present

## 2018-11-21 DIAGNOSIS — Z510 Encounter for antineoplastic radiation therapy: Secondary | ICD-10-CM | POA: Diagnosis not present

## 2018-11-25 ENCOUNTER — Telehealth: Payer: Self-pay

## 2018-11-25 DIAGNOSIS — H93A1 Pulsatile tinnitus, right ear: Secondary | ICD-10-CM | POA: Diagnosis not present

## 2018-11-25 NOTE — Telephone Encounter (Signed)
Prior Authorization initiated via CoverMyMeds for Pregabalin 75MG  capsules Key: [id number]

## 2018-11-26 MED ORDER — PREGABALIN 150 MG PO CAPS: 150.0000 mg | ORAL_CAPSULE | Freq: Two times a day (BID) | ORAL | 3 refills | Status: DC

## 2018-11-26 NOTE — Telephone Encounter (Signed)
150mg  sent to her pharmacy. Please let patient know.

## 2018-11-26 NOTE — Telephone Encounter (Signed)
PA denied.  Reasoning: Plan limit only allows 3 capsules per day because 150mg  capsules are covered and available. Patient currently taking 2 BID.  Change Rx to 150mg ?

## 2018-11-26 NOTE — Telephone Encounter (Signed)
Patient notified

## 2018-11-27 DIAGNOSIS — R808 Other proteinuria: Secondary | ICD-10-CM | POA: Diagnosis not present

## 2018-11-27 DIAGNOSIS — E1122 Type 2 diabetes mellitus with diabetic chronic kidney disease: Secondary | ICD-10-CM | POA: Diagnosis not present

## 2018-11-27 DIAGNOSIS — E119 Type 2 diabetes mellitus without complications: Secondary | ICD-10-CM | POA: Insufficient documentation

## 2018-11-27 DIAGNOSIS — E875 Hyperkalemia: Secondary | ICD-10-CM | POA: Diagnosis not present

## 2018-11-27 DIAGNOSIS — I129 Hypertensive chronic kidney disease with stage 1 through stage 4 chronic kidney disease, or unspecified chronic kidney disease: Secondary | ICD-10-CM | POA: Diagnosis not present

## 2018-11-27 DIAGNOSIS — N184 Chronic kidney disease, stage 4 (severe): Secondary | ICD-10-CM | POA: Diagnosis not present

## 2018-11-27 DIAGNOSIS — N183 Chronic kidney disease, stage 3 unspecified: Secondary | ICD-10-CM | POA: Insufficient documentation

## 2018-11-27 DIAGNOSIS — R809 Proteinuria, unspecified: Secondary | ICD-10-CM | POA: Insufficient documentation

## 2018-11-30 ENCOUNTER — Other Ambulatory Visit: Payer: Self-pay | Admitting: Family Medicine

## 2018-12-03 ENCOUNTER — Other Ambulatory Visit: Payer: Self-pay

## 2018-12-04 ENCOUNTER — Inpatient Hospital Stay (HOSPITAL_BASED_OUTPATIENT_CLINIC_OR_DEPARTMENT_OTHER): Payer: Medicare Other | Admitting: Internal Medicine

## 2018-12-04 ENCOUNTER — Inpatient Hospital Stay: Payer: Medicare Other | Attending: Internal Medicine

## 2018-12-04 ENCOUNTER — Other Ambulatory Visit: Payer: Self-pay

## 2018-12-04 VITALS — BP 129/81 | HR 77 | Temp 99.3°F | Resp 16 | Wt 218.0 lb

## 2018-12-04 DIAGNOSIS — Z79899 Other long term (current) drug therapy: Secondary | ICD-10-CM | POA: Diagnosis not present

## 2018-12-04 DIAGNOSIS — Z79811 Long term (current) use of aromatase inhibitors: Secondary | ICD-10-CM

## 2018-12-04 DIAGNOSIS — Z7984 Long term (current) use of oral hypoglycemic drugs: Secondary | ICD-10-CM | POA: Insufficient documentation

## 2018-12-04 DIAGNOSIS — I209 Angina pectoris, unspecified: Secondary | ICD-10-CM

## 2018-12-04 DIAGNOSIS — E1122 Type 2 diabetes mellitus with diabetic chronic kidney disease: Secondary | ICD-10-CM | POA: Diagnosis not present

## 2018-12-04 DIAGNOSIS — Z170 Estrogen receptor positive status [ER+]: Secondary | ICD-10-CM | POA: Diagnosis not present

## 2018-12-04 DIAGNOSIS — C50312 Malignant neoplasm of lower-inner quadrant of left female breast: Secondary | ICD-10-CM

## 2018-12-04 DIAGNOSIS — Z7901 Long term (current) use of anticoagulants: Secondary | ICD-10-CM | POA: Diagnosis not present

## 2018-12-04 DIAGNOSIS — N183 Chronic kidney disease, stage 3 (moderate): Secondary | ICD-10-CM | POA: Diagnosis not present

## 2018-12-04 DIAGNOSIS — E039 Hypothyroidism, unspecified: Secondary | ICD-10-CM | POA: Insufficient documentation

## 2018-12-04 DIAGNOSIS — I130 Hypertensive heart and chronic kidney disease with heart failure and stage 1 through stage 4 chronic kidney disease, or unspecified chronic kidney disease: Secondary | ICD-10-CM | POA: Insufficient documentation

## 2018-12-04 LAB — COMPREHENSIVE METABOLIC PANEL
ALT: 13 U/L (ref 0–44)
AST: 16 U/L (ref 15–41)
Albumin: 3.5 g/dL (ref 3.5–5.0)
Alkaline Phosphatase: 75 U/L (ref 38–126)
Anion gap: 10 (ref 5–15)
BUN: 26 mg/dL — ABNORMAL HIGH (ref 8–23)
CO2: 25 mmol/L (ref 22–32)
Calcium: 8 mg/dL — ABNORMAL LOW (ref 8.9–10.3)
Chloride: 100 mmol/L (ref 98–111)
Creatinine, Ser: 1.55 mg/dL — ABNORMAL HIGH (ref 0.44–1.00)
GFR calc Af Amer: 38 mL/min — ABNORMAL LOW (ref >60)
GFR calc non Af Amer: 32 mL/min — ABNORMAL LOW (ref >60)
Glucose, Bld: 250 mg/dL — ABNORMAL HIGH (ref 70–99)
Potassium: 4.8 mmol/L (ref 3.5–5.1)
Sodium: 135 mmol/L (ref 135–145)
Total Bilirubin: 0.8 mg/dL (ref 0.3–1.2)
Total Protein: 7 g/dL (ref 6.5–8.1)

## 2018-12-04 LAB — CBC WITH DIFFERENTIAL/PLATELET
Abs Immature Granulocytes: 0.04 10*3/uL (ref 0.00–0.07)
Basophils Absolute: 0.1 10*3/uL (ref 0.0–0.1)
Basophils Relative: 1 %
Eosinophils Absolute: 0.4 10*3/uL (ref 0.0–0.5)
Eosinophils Relative: 5 %
HCT: 43.3 % (ref 36.0–46.0)
Hemoglobin: 13.4 g/dL (ref 12.0–15.0)
Immature Granulocytes: 1 %
Lymphocytes Relative: 19 %
Lymphs Abs: 1.6 10*3/uL (ref 0.7–4.0)
MCH: 28.1 pg (ref 26.0–34.0)
MCHC: 30.9 g/dL (ref 30.0–36.0)
MCV: 90.8 fL (ref 80.0–100.0)
Monocytes Absolute: 0.9 10*3/uL (ref 0.1–1.0)
Monocytes Relative: 12 %
Neutro Abs: 5.1 10*3/uL (ref 1.7–7.7)
Neutrophils Relative %: 62 %
Platelets: 228 10*3/uL (ref 150–400)
RBC: 4.77 MIL/uL (ref 3.87–5.11)
RDW: 14.1 % (ref 11.5–15.5)
WBC: 8.1 10*3/uL (ref 4.0–10.5)
nRBC: 0 % (ref 0.0–0.2)

## 2018-12-04 MED ORDER — ANASTROZOLE 1 MG PO TABS: 1.0000 mg | ORAL_TABLET | Freq: Every day | ORAL | 4 refills | Status: DC

## 2018-12-04 NOTE — Progress Notes (Signed)
Tam[name] [place] CONSULT NOTE  Patient Care Team: [name], DO as PCP - General (Family Medicine) [name], [name], MD as Consulting Physician (Cardiology) [name], MD as Consulting Physician (Urology) [name], MD as Consulting Physician (Nephrology) [name], MD as Referring Physician (Orthopedic Surgery) [name], MD as Attending Physician (Endocrinology) [name], MD as Referring Physician (Vascular Surgery) De Hollingshead, [facility] as Pharmacist (Pharmacist)  CHIEF COMPLAINTS/PURPOSE OF CONSULTATION: Breast cancer  #  Oncology History Overview Note   # July 2020 Left Breast- IMC; 14 mm; Grade: 2; Lymphovascular Invasion: Not identified;  pT1c pN0(sn); ER positive, PR positive, HER2 negative. [s/p lumpec; Dr.Byrnett]; rs/p RT [finished Sep, 9th 2020]  # Sep end 2020- START Arimidexa  # CKD-III/COPD/ CHF-compesated/PAD; CAD  DIAGNOSIS: Left breast cancer  STAGE: 1        ;GOALS: Cure  CURRENT/MOST RECENT THERAPY: Radiation   Malignant neoplasm of lower-inner quadrant of left female breast ([place]) (Resolved)  08/14/2018 Initial Diagnosis   Malignant neoplasm of lower-inner quadrant of left female breast [facility])   Carcinoma of lower-inner quadrant of left breast in female, estrogen receptor positive ([place])  10/02/2018 Initial Diagnosis   Carcinoma of lower-inner quadrant of left breast in female, estrogen receptor positive ([place])      HISTORY OF PRESENTING ILLNESS:  Tammy [name] 73 y.o.  female to history of stage I ER PR positive/HER-2 negative breast cancer is in for follow-up.  Patient is currently status post radiation patient approximately 2 weeks ago.  Patient tolerated patient fairly good without any major side effects.  No nausea no vomiting.  Mild fatigue.  Patient is here to proceed with antihormone therapy.  Review of Systems  Constitutional: Negative for chills, diaphoresis, fever, malaise/fatigue and weight  loss.  HENT: Negative for nosebleeds and sore throat.   Eyes: Negative for double vision.  Respiratory: Negative for cough, hemoptysis, sputum production, shortness of breath and wheezing.   Cardiovascular: Negative for chest pain, palpitations, orthopnea and leg swelling.  Gastrointestinal: Negative for abdominal pain, blood in stool, constipation, diarrhea, heartburn, melena, nausea and vomiting.  Genitourinary: Negative for dysuria, frequency and urgency.  Musculoskeletal: Positive for back pain and joint pain.  Skin: Negative.  Negative for itching and rash.  Neurological: Negative for dizziness, tingling, focal weakness, weakness and headaches.  Endo/Heme/Allergies: Does not bruise/bleed easily.  Psychiatric/Behavioral: Negative for depression. The patient is not nervous/anxious and does not have insomnia.      MEDICAL HISTORY:  Past Medical History:  Diagnosis Date  . Asthma   . Benign neoplasm of colon   . Cervical disc disease    "bulging" - no limitations per pt  . Chronic diastolic CHF (congestive heart failure) ([place])    a. 07/2012 Echo: Nl EF. mild inferoseptal HK, Gr 2 DD, mild conc LVH, mild PR/TR, mild PAH.  . CKD (chronic kidney disease), stage III ([place])   . COPD (chronic obstructive pulmonary disease) ([place])   . Coronary artery disease    a. 11/2011 Cath: LAD 96md, LCX min irregs, RCA 70p, 1073mith L->R collats, EF 60%-->Med Rx.  . Diabetes mellitus without complication ([place]  . Fusion of lumbar spine 12/22/2015  . GERD (gastroesophageal reflux disease)   . History of DVT (deep vein thrombosis)   . History of tobacco abuse    a. Quit 2011.  . Marland Kitchenyperlipidemia   . Hypertension   . Hypothyroidism   . Obesity   . Palpitations   .  PE (pulmonary embolism) 10/15  . PONV (postoperative nausea and vomiting)   . Pulmonary embolus ([place]) 12/25/2013   RLL. Presented with SOB and elevated d-dimer.   [name] PVD (peripheral vascular disease) ([place])    a. PTA of right leg and left  femoral artery with stenosis.  . Renal insufficiency   . Stroke [facility]) 2015   TIAs - no deficits    SURGICAL HISTORY: Past Surgical History:  Procedure Laterality Date  . ANGIOPLASTY / STENTING FEMORAL     PVD; angioplasty right leg and left femoral artery with stenosis.   . APPENDECTOMY  Oct. 2016  . BACK SURGERY  03/2012  . BACK SURGERY     screws, rods replaced with new hardware.  [name] BACK SURGERY  11/2016  . CARDIAC CATHETERIZATION  12/07/2011   Mid LAD 50%, distal LAD 50%, mid RCA 70%, distal RCA 100%   . CARDIAC CATHETERIZATION  01/04/2011   100% occluded mid RCA with good collaterals from distal LAD, normal LVEF.  [name] CHOLECYSTECTOMY    . COLONOSCOPY  2013  . COLONOSCOPY WITH PROPOFOL N/A 05/24/2015   Procedure: COLONOSCOPY WITH PROPOFOL;  Surgeon: [name], MD;  Location: [facility];  Service: Endoscopy;  Laterality: N/A;  Diabetic - oral meds  . LAPAROSCOPIC APPENDECTOMY N/A 01/06/2015   Procedure: APPENDECTOMY LAPAROSCOPIC;  Surgeon: [name], MD;  Location: [facility];  Service: General;  Laterality: N/A;  . MASTECTOMY W/ SENTINEL NODE BIOPSY Left 08/26/2018   Procedure: PARTIAL MASTECTOMY WIDE EXCISION WITH SENTINEL LYMPH NODE BIOPSY LEFT;  Surgeon: [name], MD;  Location: [facility];  Service: General;  Laterality: Left;  . POLYPECTOMY  05/24/2015   Procedure: POLYPECTOMY;  Surgeon: [name], MD;  Location: [facility];  Service: Endoscopy;;  . THYROIDECTOMY    . TRIGGER FINGER RELEASE      SOCIAL HISTORY: Social History   Socioeconomic History  . Marital status: Married    Spouse name: Not on file  . Number of children: 2  . Years of education: some college, Production assistant, radio   . Highest education level: Associate degree: occupational, Hotel manager, or vocational program  Occupational History  . Occupation: Retired  Scientific laboratory technician  . Financial resource strain: Not hard at all  . Food insecurity    Worry:  Never true    Inability: Never true  . Transportation needs    Medical: No    Non-medical: No  Tobacco Use  . Smoking status: Former Smoker    Packs/day: 0.50    Years: 15.00    Pack years: 7.50    Types: Cigarettes    Quit date: 03/14/2007    Years since quitting: 11.7  . Smokeless tobacco: Never Used  Substance and Sexual Activity  . Alcohol use: No    Alcohol/week: 0.0 standard drinks  . Drug use: No  . Sexual activity: Yes    Birth control/protection: Post-menopausal  Lifestyle  . Physical activity    Days per week: 0 days    Minutes per session: 0 min  . Stress: Not at all  Relationships  . Social connections    Talks on phone: More than three times a week    Gets together: More than three times a week    Attends religious service: 1 to 4 times per year    Active member of club or organization: No    Attends meetings of clubs or organizations: Never    Relationship status: Married  . Intimate  partner violence    Fear of current or ex partner: No    Emotionally abused: No    Physically abused: No    Forced sexual activity: No  Other Topics Concern  . Not on file  Social History Narrative   Lives at home with husband in [place]. Quit smoking 10 years ago; never alcohol. 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     FAMILY HISTORY: Family History  Problem Relation Age of Onset  . Hypertension Father   . Heart disease Father   . Heart attack Father   . Diabetes Father   . Cancer Mother        colon  . Heart attack Brother   . Diabetes Brother   . Hypertension Brother   . Heart disease Brother   . Heart attack Brother   . Diabetes Brother   . Hypertension Brother   . Heart disease Brother   . Heart attack Brother   . Diabetes Brother   . Hypertension Brother   . Heart disease Brother   . Cancer Sister        lung  . COPD Sister   . COPD Sister   . Breast cancer Neg Hx     ALLERGIES:  is allergic to  hydrocodone; aleve [naproxen sodium]; contrast media [iodinated diagnostic agents]; ioxaglate; oxycodone; ranexa [ranolazine]; sulfa antibiotics; and tramadol.  MEDICATIONS:  Current Outpatient Medications  Medication Sig Dispense Refill  . apixaban (ELIQUIS) 2.5 MG TABS tablet Take 1 tablet (2.5 mg) by mouth twice daily 180 tablet 3  . Benzonatate (TESSALON PERLES PO) Take 1 capsule by mouth as needed (cough).    . blood glucose meter kit and supplies KIT Dispense based on patient and insurance preference. Use one time daily as directed, E11.9 1 each 0  . Dulaglutide (TRULICITY) 1.5 CE/0.2MV SOPN Inject 1.5 mg into the skin every Tuesday. 4 pen 6  . DULoxetine (CYMBALTA) 60 MG capsule TAKE 1 CAPSULE EVERY DAY 90 capsule 1  . gabapentin (NEURONTIN) 300 MG capsule TAKE 1 CAPSULE BY MOUTH THREE TIMES DAILY 270 capsule 3  . glipiZIDE (GLUCOTROL) 5 MG tablet TAKE ONE TABLET EVERY DAY BEFORE BREAKFAST 90 tablet 1  . levothyroxine (SYNTHROID) 150 MCG tablet Take 1 tablet (150 mcg total) by mouth daily before breakfast. 90 tablet 3  . losartan (COZAAR) 25 MG tablet Take 25 mg by mouth daily.    . metoprolol succinate (TOPROL-XL) 50 MG 24 hr tablet TAKE 1 TABLET BY MOUTH DAILY WITH OR IMMEDIATLEY FOLLOWING A MEAL 90 tablet 3  . pantoprazole (PROTONIX) 40 MG tablet TAKE 1 TABLET BY MOUTH DAILY AS NEEDED 90 tablet 1  . pregabalin (LYRICA) 150 MG capsule Take 1 capsule (150 mg total) by mouth 2 (two) times daily. 60 capsule 3  . psyllium (METAMUCIL) 58.6 % packet Take 1 packet by mouth daily as needed (fiber).     . rosuvastatin (CRESTOR) 40 MG tablet TAKE 1 TABLET BY MOUTH DAILY 90 tablet 1  . trimethoprim (TRIMPEX) 100 MG tablet Take 1 tablet (100 mg total) by mouth daily. 30 tablet 6  . anastrozole (ARIMIDEX) 1 MG tablet Take 1 tablet (1 mg total) by mouth daily. 30 tablet 4  . isosorbide mononitrate (IMDUR) 60 MG 24 hr tablet TAKE 1 TABLET BY MOUTH AT BEDTIME AND 1/2 TABLET IN THE MORNING 135 tablet 3   . nitroGLYCERIN (NITROSTAT) 0.4 MG SL tablet Place 1 tablet (0.4 mg  total) under the tongue every 5 (five) minutes as needed for chest pain. (Patient not taking: Reported on 11/06/2018) 25 tablet 6  . PROAIR HFA 108 (90 Base) MCG/ACT inhaler TAKE 2 PUFFS EVERY 4 HOURS AS NEEDED FORWHEEZING OR SHORTNESS OF BREATH 8.5 g 3   No current facility-administered medications for this visit.       [name]  PHYSICAL EXAMINATION: ECOG PERFORMANCE STATUS: 1 - Symptomatic but completely ambulatory  Vitals:   12/03/18 1400  BP: 129/81  Pulse: 77  Resp: 16  Temp: 99.3 F (37.4 C)   Filed Weights   12/03/18 1400  Weight: 218 lb (98.9 kg)    Physical Exam  Constitutional: She is oriented to person, place, and time and well-developed, well-nourished, and in no distress.  HENT:  Head: Normocephalic and atraumatic.  Mouth/Throat: Oropharynx is clear and moist. No oropharyngeal exudate.  Eyes: Pupils are equal, round, and reactive to light.  Neck: Normal range of motion. Neck supple.  Cardiovascular: Normal rate and regular rhythm.  Pulmonary/Chest: No respiratory distress. She has no wheezes.  Abdominal: Soft. Bowel sounds are normal. She exhibits no distension and no mass. There is no abdominal tenderness. There is no rebound and no guarding.  Musculoskeletal: Normal range of motion.        General: No tenderness or edema.  Neurological: She is alert and oriented to person, place, and time.  Skin: Skin is warm.  Psychiatric: Affect normal.   LABORATORY DATA:  I have reviewed the data as listed Lab Results  Component Value Date   WBC 8.1 12/04/2018   HGB 13.4 12/04/2018   HCT 43.3 12/04/2018   MCV 90.8 12/04/2018   PLT 228 12/04/2018   Recent Labs    05/21/18 1355 08/22/18 0954 10/03/18 1918 12/04/18 1020  NA 138 141  --  135  K 6.0* 4.7  --  4.8  CL 100 106  --  100  CO2 22 27  --  25  GLUCOSE 138* 159*  --  250*  BUN 28* 24*  --  26*  CREATININE 1.59* 1.40* 1.70* 1.55*   CALCIUM 9.3 8.6*  --  8.0*  GFRNONAA 32* 37*  --  32*  GFRAA 37* 42*  --  38*  PROT 7.2  --   --  7.0  ALBUMIN 4.4  --   --  3.5  AST 17  --   --  16  ALT 11  --   --  13  ALKPHOS 75  --   --  75  BILITOT 0.5  --   --  0.8    RADIOGRAPHIC STUDIES: I have personally reviewed the radiological images as listed and agreed with the findings in the report. No results found.  ASSESSMENT & PLAN:   Carcinoma of lower-inner quadrant of left breast in female, estrogen receptor positive ([place]) #Left breast stage I breast cancer ER PR positive HER-2 negative; status post lumpectomy.  MammaPrint low risk.  Status post radiation.  #Proceed with adjuvant antihormone therapy.  Discussed regarding use of aromatase inhibitor/anastrozole.  Discussed the potential side effects including but not limited to hot flashes joint pains and osteoporosis.  In general would recommend 5 years.  # Hypocalcemia-mild.  Recommend calcium plus vitamin D.  Also would screen for osteoporosis.  # DISPOSITION: #Follow-up in 2 months; MD- labs- bmp/ BMD prior-Dr. B    All questions were answered. The patient/family knows to call the clinic with any problems, questions or concerns.  Cammie Sickle, MD 12/11/2018 7:44 AM

## 2018-12-04 NOTE — Assessment & Plan Note (Addendum)
#  Left breast stage I breast cancer ER PR positive HER-2 negative; status post lumpectomy.  MammaPrint low risk.  Status post radiation.  #Proceed with adjuvant antihormone therapy.  Discussed regarding use of aromatase inhibitor/anastrozole.  Discussed the potential side effects including but not limited to hot flashes joint pains and osteoporosis.  In general would recommend 5 years.  # Hypocalcemia-mild.  Recommend calcium plus vitamin D.  Also would screen for osteoporosis.  # DISPOSITION: #Follow-up in 2 months; MD- labs- bmp/ BMD prior-Dr. B

## 2018-12-05 ENCOUNTER — Other Ambulatory Visit: Payer: Self-pay | Admitting: Family Medicine

## 2018-12-05 NOTE — Telephone Encounter (Signed)
Requested medication (s) are due for refill today: yes  Requested medication (s) are on the active medication list: yes  Last refill:  09/09/2018  Future visit scheduled: yes  Notes to clinic:  Review for refill   Requested Prescriptions  Pending Prescriptions Disp Refills   isosorbide mononitrate (IMDUR) 60 MG 24 hr tablet [Pharmacy Med Name: ISOSORBIDE MONONITRATE ER 60 MG TAB] 135 tablet 3    Sig: TAKE 1 TABLET BY MOUTH AT BEDTIME AND 1/2 TABLET IN THE MORNING     Cardiovascular:  Nitrates Passed - 12/05/2018  2:23 PM      Passed - Last BP in normal range    BP Readings from Last 1 Encounters:  12/03/18 129/81         Passed - Last Heart Rate in normal range    Pulse Readings from Last 1 Encounters:  12/03/18 77         Passed - Valid encounter within last 12 months    Recent Outpatient Visits          1 month ago Chronic obstructive pulmonary disease, unspecified COPD type ([place])   [facility], Jeannette How, MD   2 months ago Uncontrolled type 2 diabetes mellitus with complication, without long-term current use of insulin ([place])   [place], Megan P, DO   4 months ago Spinal stenosis of lumbar region, unspecified whether neurogenic claudication present   [facility], Megan P, DO   5 months ago Spinal stenosis of lumbar region, unspecified whether neurogenic claudication present   [facility], Megan P, DO   5 months ago Spinal stenosis of lumbar region, unspecified whether neurogenic claudication present   [place], Barb Merino, DO      Future Appointments            In 4 weeks Wynetta Emery, Barb Merino, DO [employer], [place]   In 1 month Tahiliani, Lennette Bihari, MD [place] [address] [phone] Base) MCG/ACT inhaler [Pharmacy Med Name: PROAIR HFA 108 (90 BASE) MCG/ACT IN] 8.5 g 3    Sig: TAKE 2 PUFFS EVERY 4 HOURS AS NEEDED FORWHEEZING OR  SHORTNESS OF BREATH     Pulmonology:  [facility] Failed - 12/05/2018  2:23 PM      Failed - One inhaler should last at least one month. If the patient is requesting refills earlier, contact the patient to check for uncontrolled symptoms.      Passed - Valid encounter within last 12 months    Recent Outpatient Visits          1 month ago Chronic obstructive pulmonary disease, unspecified COPD type ([place])   [facility] Cr[place], Jeannette How, MD   2 months ago Uncontrolled type 2 diabetes mellitus with complication, without long-term current use of insulin ([place])   [place], Megan P, DO   4 months ago Spinal stenosis of lumbar region, unspecified whether neurogenic claudication present   [facility], Megan P, DO   5 months ago Spinal stenosis of lumbar region, unspecified whether neurogenic claudication present   [place], DO   5 months ago Spinal stenosis of lumbar region, unspecified whether neurogenic claudication present   [facility], Barb Merino, DO      Future Appointments  In 4 weeks Wynetta Emery, Barb Merino, DO [place], [place]   In 1 month [name], MD [place]

## 2018-12-13 ENCOUNTER — Other Ambulatory Visit: Payer: Self-pay

## 2018-12-16 ENCOUNTER — Other Ambulatory Visit: Payer: Self-pay

## 2018-12-16 ENCOUNTER — Ambulatory Visit
Admission: RE | Admit: 2018-12-16 | Discharge: 2018-12-16 | Disposition: A | Discharge status: Home / Self Care | Payer: Medicare Other | Source: Ambulatory Visit | Attending: Radiation Oncology | Admitting: Radiation Oncology

## 2018-12-16 ENCOUNTER — Encounter: Payer: Self-pay | Admitting: Radiation Oncology

## 2018-12-16 VITALS — BP 139/94 | HR 82 | Temp 98.5°F | Resp 20 | Wt 217.9 lb

## 2018-12-16 DIAGNOSIS — Z79811 Long term (current) use of aromatase inhibitors: Secondary | ICD-10-CM | POA: Diagnosis not present

## 2018-12-16 DIAGNOSIS — C50312 Malignant neoplasm of lower-inner quadrant of left female breast: Secondary | ICD-10-CM

## 2018-12-16 DIAGNOSIS — Z170 Estrogen receptor positive status [ER+]: Secondary | ICD-10-CM | POA: Diagnosis not present

## 2018-12-16 DIAGNOSIS — Z923 Personal history of irradiation: Secondary | ICD-10-CM | POA: Insufficient documentation

## 2018-12-16 DIAGNOSIS — H35371 Puckering of macula, right eye: Secondary | ICD-10-CM | POA: Diagnosis not present

## 2018-12-16 NOTE — Progress Notes (Signed)
Radiation Oncology Follow up Note  Name: Tammy [name]   Date:   12/16/2018 MRN:  [id number] DOB: 1945-03-19    This 73 y.o. female presents to the clinic today for 1 month follow-up status post whole breast radiation to her left breast for a stage I ER PR positive invasive mammary carcinoma.  REFERRING PROVIDER: [name], DO  HPI: Patient is a 73 year old female now at 1 month having completed whole breast radiation to her left breast for stage I ER PR positive invasive mammary carcinoma.  Seen today in routine follow-up she is doing well.  She specifically denies breast tenderness cough or bone pain.  She does have some occasional shooting pains typical of postsurgical breast..  She is currently on arimadex tolerating that well without side effect  COMPLICATIONS OF TREATMENT: none  FOLLOW UP COMPLIANCE: keeps appointments   PHYSICAL EXAM:  BP (!) 139/94 (BP Location: Left Arm, Patient Position: Sitting)   Pulse 82   Temp 98.5 F (36.9 C) (Tympanic)   Resp 20   Wt 217 lb 14.4 oz (98.8 kg)   BMI 35.17 kg/m  Lungs are clear to A&P cardiac examination essentially unremarkable with regular rate and rhythm. No dominant mass or nodularity is noted in either breast in 2 positions examined. Incision is well-healed. No axillary or supraclavicular adenopathy is appreciated. Cosmetic result is excellent.  Well-developed well-nourished patient in NAD. HEENT reveals PERLA, EOMI, discs not visualized.  Oral cavity is clear. No oral mucosal lesions are identified. Neck is clear without evidence of cervical or supraclavicular adenopathy. Lungs are clear to A&P. Cardiac examination is essentially unremarkable with regular rate and rhythm without murmur rub or thrill. Abdomen is benign with no organomegaly or masses noted. Motor sensory and DTR levels are equal and symmetric in the upper and lower extremities. Cranial nerves II through XII are grossly intact. Proprioception is intact. No peripheral  adenopathy or edema is identified. No motor or sensory levels are noted. Crude visual fields are within normal range.  RADIOLOGY RESULTS: No current films for review  PLAN: Present time patient is doing well 1 month out from whole breast radiation and pleased with her overall progress.  She is currently on arimadex will continue as planned.  I have asked to see her back in 4 to 5 months for follow-up.  Patient knows to call sooner with any concerns.  I would like to take this opportunity to thank you for allowing me to participate in the care of your patient.[name], MD

## 2018-12-19 ENCOUNTER — Ambulatory Visit: Payer: Medicare Other | Admitting: Gastroenterology

## 2018-12-25 ENCOUNTER — Ambulatory Visit: Payer: Medicare Other | Admitting: Pharmacist

## 2018-12-25 DIAGNOSIS — M48061 Spinal stenosis, lumbar region without neurogenic claudication: Secondary | ICD-10-CM

## 2018-12-25 NOTE — Chronic Care Management (AMB) (Signed)
Chronic Care Management   Follow Up Note   12/25/2018 Name: Tammy [name] MRN: [id number] DOB: March 01, 1946  Referred by: [name], DO Reason for referral : Chronic Care Management (Medication Management)   Tammy [name] is a 73 y.o. year old female who is a primary care patient of [name], DO. The CCM team was consulted for assistance with chronic disease management and care coordination needs.    Contacted patient for medication management review.   Review of patient status, including review of consultants reports, relevant laboratory and other test results, and collaboration with appropriate care team members and the patient's provider was performed as part of comprehensive patient evaluation and provision of chronic care management services.    SDOH (Social Determinants of Health) screening performed today: Stress. See Care Plan for related entries.   Advanced Directives Status: N See Care Plan and Vynca application for related entries.  Outpatient Encounter Medications as of 12/25/2018  Medication Sig Note   anastrozole (ARIMIDEX) 1 MG tablet Take 1 tablet (1 mg total) by mouth daily.    cholecalciferol (VITAMIN D3) 25 MCG (1000 UT) tablet Take 1,000 Units by mouth daily.    DULoxetine (CYMBALTA) 60 MG capsule TAKE 1 CAPSULE EVERY DAY    gabapentin (NEURONTIN) 300 MG capsule TAKE 1 CAPSULE BY MOUTH THREE TIMES DAILY 12/03/2018: One tab in the am and 2 tablets at bedtime   pantoprazole (PROTONIX) 40 MG tablet TAKE 1 TABLET BY MOUTH DAILY AS NEEDED    pregabalin (LYRICA) 150 MG capsule Take 1 capsule (150 mg total) by mouth 2 (two) times daily.    apixaban (ELIQUIS) 2.5 MG TABS tablet Take 1 tablet (2.5 mg) by mouth twice daily    Benzonatate (TESSALON PERLES PO) Take 1 capsule by mouth as needed (cough).    blood glucose meter kit and supplies KIT Dispense based on patient and insurance preference. Use one time daily as directed, E11.9    Dulaglutide  (TRULICITY) 1.5 GE/3.6OQ SOPN Inject 1.5 mg into the skin every Tuesday.    glipiZIDE (GLUCOTROL) 5 MG tablet TAKE ONE TABLET EVERY DAY BEFORE BREAKFAST    isosorbide mononitrate (IMDUR) 60 MG 24 hr tablet TAKE 1 TABLET BY MOUTH AT BEDTIME AND 1/2 TABLET IN THE MORNING    levothyroxine (SYNTHROID) 150 MCG tablet Take 1 tablet (150 mcg total) by mouth daily before breakfast.    losartan (COZAAR) 25 MG tablet Take 25 mg by mouth daily. 11/08/2018: QPM   metoprolol succinate (TOPROL-XL) 50 MG 24 hr tablet TAKE 1 TABLET BY MOUTH DAILY WITH OR IMMEDIATLEY FOLLOWING A MEAL 11/08/2018: QPM   nitroGLYCERIN (NITROSTAT) 0.4 MG SL tablet Place 1 tablet (0.4 mg total) under the tongue every 5 (five) minutes as needed for chest pain.    PROAIR HFA 108 (90 Base) MCG/ACT inhaler TAKE 2 PUFFS EVERY 4 HOURS AS NEEDED FORWHEEZING OR SHORTNESS OF BREATH    psyllium (METAMUCIL) 58.6 % packet Take 1 packet by mouth daily as needed (fiber).     rosuvastatin (CRESTOR) 40 MG tablet TAKE 1 TABLET BY MOUTH DAILY    trimethoprim (TRIMPEX) 100 MG tablet Take 1 tablet (100 mg total) by mouth daily.    No facility-administered encounter medications on file as of 12/25/2018.      Goals Addressed            This Visit's Progress     Patient Stated    "I want to keep feeling good" (pt-stated)  Current Barriers:   Polypharmacy; complex patient with multiple comorbidities including: o Breast cancer, ER +; completed radiation therapy, started on anastrazole. Denies any tolerability concerns. Hx PE, tx Eliquis 2.5 mg BID. Saw Dr. Burlene Arnt last week, he recommended calcium + Vitamin D supplementation, as well as a DEXA scan. It appears he ordered this, but I don't see anything regarding scheduling. Patient was unaware of this o Sciatic nerve neuralgia; duloxetine 60 mg daily, gabapentin 300 mg QAM, 600 mg QPM; and pregabalin 150 mg BID; notes she is significantly struggling with pain and lack of mobility right  now.  o HTN, PAD, abdominal aortic atherosclerosis: losartan 25 mg QPM, metoprolol succinate 50 mg QPM; rosuvastatin 40 mg daily; does not check BP at home, but generally well controlled at goal <140/90 at recent office visits; LDL at goal <70; last seen by Dr. Lucky Cowboy 09/5434 o G6VP: Trulicity 1.5 mg daily, glipizide 5 mg QAM; A1c elevated at 8% on last check; reports BG are remaining well controlled  o UTI ppx: trimethoprim 100 mg daily; last seen by Dr. Elenor Quinones 02/2018  o COPD: tx albuterol HFA PRN;   Self-manages medications. Notes that she is struggling most with her back pain right now  Pharmacist Clinical Goal(s):   Over the next 90 days, patient will work with PharmD and provider towards optimized medication management  Interventions:  Congratulated patient on progressing through radiation therapy.   Reviewed the recommendation for calcium + Vitamin D, as well as DEXA scan. Will discuss w/ Dr. Wynetta Emery. Recommended patient start calcium citrate supplementation (as opposed to calcium carbonate d/t pantoprazole therapy) and continue daily Vitamin D supplementation.   Recommended patient trial lidoderm patches, OTC 4% available. Recommended she discuss prescription lidoderm patches w/ Dr. Wynetta Emery at upcoming appointment.   Patient Self Care Activities:   Patient will take medications as prescribed  Patient will contact provider or CCM team with any questions  Please see past updates related to this goal by clicking on the "Past Updates" button in the selected goal          Plan: - Will collaborate w/ Dr. Wynetta Emery on the above.  - Will outreach patient in the next 6-8 weeks for continued medication management concern  Catie Darnelle Maffucci, PharmD Clinical Pharmacist [place] [phone]

## 2018-12-25 NOTE — Patient Instructions (Signed)
Visit Information  Goals Addressed            This Visit's Progress     Patient Stated   . "I want to keep feeling good" (pt-stated)       Current Barriers:  . Polypharmacy; complex patient with multiple comorbidities including: o Breast cancer, ER +; completed radiation therapy, started on anastrazole. Denies any tolerability concerns. Hx PE, tx Eliquis 2.5 mg BID. Saw Dr. Burlene Arnt last week, he recommended calcium + Vitamin D supplementation, as well as a DEXA scan. It appears he ordered this, but I don't see anything regarding scheduling. Patient was unaware of this o Sciatic nerve neuralgia; duloxetine 60 mg daily, gabapentin 300 mg QAM, 600 mg QPM; and pregabalin 150 mg BID; notes she is significantly struggling with pain and lack of mobility right now.  o HTN, PAD, abdominal aortic atherosclerosis: losartan 25 mg QPM, metoprolol succinate 50 mg QPM; rosuvastatin 40 mg daily; does not check BP at home, but generally well controlled at goal <140/90 at recent office visits; LDL at goal <70; last seen by Dr. Lucky Cowboy 04/8784 o V6HM: Trulicity 1.5 mg daily, glipizide 5 mg QAM; A1c elevated at 8% on last check; reports BG are remaining well controlled  o UTI ppx: trimethoprim 100 mg daily; last seen by Dr. Elenor Quinones 02/2018  o COPD: tx albuterol HFA PRN;  . Self-manages medications. Notes that she is struggling most with her back pain right now  Pharmacist Clinical Goal(s):  [name] Over the next 90 days, patient will work with PharmD and provider towards optimized medication management  Interventions: . Congratulated patient on progressing through radiation therapy.  . Reviewed the recommendation for calcium + Vitamin D, as well as DEXA scan. Will discuss w/ Dr. Wynetta Emery. Recommended patient start calcium citrate supplementation (as opposed to calcium carbonate d/t pantoprazole therapy) and continue daily Vitamin D supplementation.  . Recommended patient trial lidoderm patches, OTC 4% available.  Recommended she discuss prescription lidoderm patches w/ Dr. Wynetta Emery at upcoming appointment.   Patient Self Care Activities:  . Patient will take medications as prescribed . Patient will contact provider or CCM team with any questions  Please see past updates related to this goal by clicking on the "Past Updates" button in the selected goal         The patient verbalized understanding of instructions provided today and declined a print copy of patient instruction materials.   Plan: - Will collaborate w/ Dr. Wynetta Emery on the above.  - Will outreach patient in the next 6-8 weeks for continued medication management concern  Catie Darnelle Maffucci, PharmD Clinical Pharmacist [place] [phone]

## 2018-12-30 ENCOUNTER — Other Ambulatory Visit: Payer: Self-pay | Admitting: Urology

## 2018-12-31 DIAGNOSIS — C50312 Malignant neoplasm of lower-inner quadrant of left female breast: Secondary | ICD-10-CM | POA: Diagnosis not present

## 2019-01-02 ENCOUNTER — Other Ambulatory Visit: Payer: Self-pay

## 2019-01-02 ENCOUNTER — Encounter: Payer: Self-pay | Admitting: Family Medicine

## 2019-01-02 ENCOUNTER — Ambulatory Visit (INDEPENDENT_AMBULATORY_CARE_PROVIDER_SITE_OTHER): Payer: Medicare Other | Admitting: Family Medicine

## 2019-01-02 VITALS — BP 125/68 | HR 80 | Temp 98.8°F | Ht 66.0 in | Wt 218.0 lb

## 2019-01-02 DIAGNOSIS — C50312 Malignant neoplasm of lower-inner quadrant of left female breast: Secondary | ICD-10-CM | POA: Diagnosis not present

## 2019-01-02 DIAGNOSIS — I25118 Atherosclerotic heart disease of native coronary artery with other forms of angina pectoris: Secondary | ICD-10-CM

## 2019-01-02 DIAGNOSIS — N1831 Chronic kidney disease, stage 3a: Secondary | ICD-10-CM

## 2019-01-02 DIAGNOSIS — Z23 Encounter for immunization: Secondary | ICD-10-CM

## 2019-01-02 DIAGNOSIS — E118 Type 2 diabetes mellitus with unspecified complications: Secondary | ICD-10-CM | POA: Diagnosis not present

## 2019-01-02 DIAGNOSIS — I209 Angina pectoris, unspecified: Secondary | ICD-10-CM

## 2019-01-02 DIAGNOSIS — I129 Hypertensive chronic kidney disease with stage 1 through stage 4 chronic kidney disease, or unspecified chronic kidney disease: Secondary | ICD-10-CM

## 2019-01-02 DIAGNOSIS — E039 Hypothyroidism, unspecified: Secondary | ICD-10-CM

## 2019-01-02 DIAGNOSIS — E782 Mixed hyperlipidemia: Secondary | ICD-10-CM | POA: Diagnosis not present

## 2019-01-02 DIAGNOSIS — M25561 Pain in right knee: Secondary | ICD-10-CM | POA: Diagnosis not present

## 2019-01-02 DIAGNOSIS — Z170 Estrogen receptor positive status [ER+]: Secondary | ICD-10-CM | POA: Diagnosis not present

## 2019-01-02 DIAGNOSIS — N2889 Other specified disorders of kidney and ureter: Secondary | ICD-10-CM

## 2019-01-02 DIAGNOSIS — E1165 Type 2 diabetes mellitus with hyperglycemia: Secondary | ICD-10-CM | POA: Diagnosis not present

## 2019-01-02 DIAGNOSIS — IMO0002 Reserved for concepts with insufficient information to code with codable children: Secondary | ICD-10-CM

## 2019-01-02 DIAGNOSIS — M25562 Pain in left knee: Secondary | ICD-10-CM

## 2019-01-02 LAB — BAYER DCA HB A1C WAIVED: HB A1C (BAYER DCA - WAIVED): 9 % — ABNORMAL HIGH (ref <7.0)

## 2019-01-02 LAB — MICROALBUMIN, URINE WAIVED
Creatinine, Urine Waived: 300 mg/dL (ref 10–300)
Microalb, Ur Waived: 150 mg/L — ABNORMAL HIGH (ref 0–19)

## 2019-01-02 MED ORDER — JARDIANCE 25 MG PO TABS: 25.0000 mg | ORAL_TABLET | Freq: Every day | ORAL | 3 refills | Status: DC

## 2019-01-02 MED ORDER — LOSARTAN POTASSIUM 25 MG PO TABS: 25.0000 mg | ORAL_TABLET | Freq: Every day | ORAL | 1 refills | Status: DC

## 2019-01-02 MED ORDER — METOPROLOL SUCCINATE ER 50 MG PO TB24: ORAL_TABLET | ORAL | 1 refills | Status: DC

## 2019-01-02 NOTE — Progress Notes (Signed)
BP 125/68   Pulse 80   Temp 98.8 F (37.1 C) (Oral)   Ht 5\' 6"  (1.676 m)   Wt 218 lb (98.9 kg)   SpO2 93%   BMI 35.19 kg/m    Subjective:    Patient ID: Tammy [name], female    DOB: 01-07-1946, 73 y.o.   MRN: [id number]  HPI: Tammy [name] is a 73 y.o. female  Chief Complaint  Patient presents with  . Diabetes  . Hyperlipidemia  . Hypertension   Just finished her radiation, has been doing well. Feeling well.   DIABETES Hypoglycemic episodes:no Polydipsia/polyuria: no Visual disturbance: no Chest pain: no Paresthesias: no Glucose Monitoring: yes  Accucheck frequency: Daily Taking Insulin?: no Blood Pressure Monitoring: not checking Retinal Examination: Up to Date Foot Exam: Done today Diabetic Education: Completed Pneumovax: Up to Date Influenza: done today Aspirin: no  HYPERTENSION / HYPERLIPIDEMIA Satisfied with current treatment? yes Duration of hypertension: chronic BP monitoring frequency: not checking BP medication side effects: no Past BP meds: metoprolol, losartan, imdur Duration of hyperlipidemia: chronic Cholesterol medication side effects: no Cholesterol supplements: none Past cholesterol medications: crestor Medication compliance: excellent compliance Aspirin: no Recent stressors: yes Recurrent headaches: no Visual changes: no Palpitations: no Dyspnea: no Chest pain: no Lower extremity edema: no Dizzy/lightheaded: no  HYPOTHYROIDISM Thyroid control status:controlled Satisfied with current treatment? yes Medication side effects: no Medication compliance: excellent compliance Recent dose adjustment:no Fatigue: no Cold intolerance: no Heat intolerance: no Weight gain: no Weight loss: no Constipation: no Diarrhea/loose stools: no Palpitations: no Lower extremity edema: no Anxiety/depressed mood: no  Relevant past medical, surgical, family and social history reviewed and updated as indicated. Interim medical history since our last  visit reviewed. Allergies and medications reviewed and updated.  Review of Systems  Constitutional: Negative.   Respiratory: Negative.   Cardiovascular: Negative.   Musculoskeletal: Negative.   Neurological: Negative.   Psychiatric/Behavioral: Negative.     Per HPI unless specifically indicated above     Objective:    BP 125/68   Pulse 80   Temp 98.8 F (37.1 C) (Oral)   Ht 5\' 6"  (1.676 m)   Wt 218 lb (98.9 kg)   SpO2 93%   BMI 35.19 kg/m   Wt Readings from Last 3 Encounters:  01/02/19 218 lb (98.9 kg)  12/16/18 217 lb 14.4 oz (98.8 kg)  12/03/18 218 lb (98.9 kg)    Physical Exam Vitals signs and nursing note reviewed.  Constitutional:      General: She is not in acute distress.    Appearance: Normal appearance. She is not ill-appearing, toxic-appearing or diaphoretic.  HENT:     Head: Normocephalic and atraumatic.     Right Ear: External ear normal.     Left Ear: External ear normal.     Nose: Nose normal.     Mouth/Throat:     Mouth: Mucous membranes are moist.     Pharynx: Oropharynx is clear.  Eyes:     General: No scleral icterus.       Right eye: No discharge.        Left eye: No discharge.     Extraocular Movements: Extraocular movements intact.     Conjunctiva/sclera: Conjunctivae normal.     Pupils: Pupils are equal, round, and reactive to light.  Neck:     Musculoskeletal: Normal range of motion and neck supple.  Cardiovascular:     Rate and Rhythm: Normal rate and regular rhythm.     Pulses:  Normal pulses.     Heart sounds: Normal heart sounds. No murmur. No friction rub. No gallop.   Pulmonary:     Effort: Pulmonary effort is normal. No respiratory distress.     Breath sounds: Normal breath sounds. No stridor. No wheezing, rhonchi or rales.  Chest:     Chest wall: No tenderness.  Musculoskeletal: Normal range of motion.  Skin:    General: Skin is warm and dry.     Capillary Refill: Capillary refill takes less than 2 seconds.      Coloration: Skin is not jaundiced or pale.     Findings: No bruising, erythema, lesion or rash.  Neurological:     General: No focal deficit present.     Mental Status: She is alert and oriented to person, place, and time. Mental status is at baseline.  Psychiatric:        Mood and Affect: Mood normal.        Behavior: Behavior normal.        Thought Content: Thought content normal.        Judgment: Judgment normal.     Results for orders placed or performed in visit on 01/02/19  Bayer DCA Hb A1c Waived  Result Value Ref Range   HB A1C (BAYER DCA - WAIVED) 9.0 (H) <7.0 %  Comprehensive metabolic panel  Result Value Ref Range   Glucose 266 (H) 65 - 99 mg/dL   BUN 22 8 - 27 mg/dL   Creatinine, Ser 1.57 (H) 0.57 - 1.00 mg/dL   GFR calc non Af Amer 32 (L) >59 mL/min/1.73   GFR calc Af Amer 37 (L) >59 mL/min/1.73   BUN/Creatinine Ratio 14 12 - 28   Sodium 137 134 - 144 mmol/L   Potassium 5.0 3.5 - 5.2 mmol/L   Chloride 101 96 - 106 mmol/L   CO2 24 20 - 29 mmol/L   Calcium 8.6 (L) 8.7 - 10.3 mg/dL   Total Protein 6.4 6.0 - 8.5 g/dL   Albumin 4.0 3.7 - 4.7 g/dL   Globulin, Total 2.4 1.5 - 4.5 g/dL   Albumin/Globulin Ratio 1.7 1.2 - 2.2   Bilirubin Total 0.6 0.0 - 1.2 mg/dL   Alkaline Phosphatase 98 39 - 117 IU/L   AST 15 0 - 40 IU/L   ALT 11 0 - 32 IU/L  Lipid Panel w/o Chol/HDL Ratio  Result Value Ref Range   Cholesterol, Total 138 100 - 199 mg/dL   Triglycerides 248 (H) 0 - 149 mg/dL   HDL 46 >39 mg/dL   VLDL Cholesterol Cal 39 5 - 40 mg/dL   LDL Chol Calc (NIH) 53 0 - 99 mg/dL  Microalbumin, Urine Waived  Result Value Ref Range   Microalb, Ur Waived 150 (H) 0 - 19 mg/L   Creatinine, Urine Waived 300 10 - 300 mg/dL   Microalb/Creat Ratio 30-300 (H) <30 mg/g  TSH  Result Value Ref Range   TSH 0.352 (L) 0.450 - 4.500 uIU/mL      Assessment & Plan:   Problem List Items Addressed This Visit      Cardiovascular and Mediastinum   Coronary atherosclerosis of native  coronary artery    Will keep BP, sugars and cholesterol under good control. Continue to monitor closely. Continue to follow with cardiology. Call with any concerns.       Relevant Medications   losartan (COZAAR) 25 MG tablet   metoprolol succinate (TOPROL-XL) 50 MG 24 hr tablet   Other Relevant Orders  Comprehensive metabolic panel (Completed)     Endocrine   Uncontrolled type 2 diabetes mellitus with complication, without long-term current use of insulin ([place]) - Primary    Not doing well with A1c of 9.0- we will start her on jardiance and recheck in 3 months. Call with any concerns. Continue to monitor.       Relevant Medications   losartan (COZAAR) 25 MG tablet   empagliflozin (JARDIANCE) 25 MG TABS tablet   Other Relevant Orders   Bayer DCA Hb A1c Waived (Completed)   Comprehensive metabolic panel (Completed)   Microalbumin, Urine Waived (Completed)   Hypothyroid    Rechecking levels today. Await results. Treat as needed. Call with any concerns.       Relevant Medications   metoprolol succinate (TOPROL-XL) 50 MG 24 hr tablet   Other Relevant Orders   Comprehensive metabolic panel (Completed)   TSH (Completed)     Genitourinary   Benign hypertensive renal disease    Under good control on current regimen. Continue current regimen. Continue to monitor. Call with any concerns. Refills given. Labs drawn today.        Relevant Orders   Comprehensive metabolic panel (Completed)   Microalbumin, Urine Waived (Completed)   Chronic renal insufficiency, stage III (moderate)    Labs to be drawn today. Await results. Continue to monitor. Call with any concerns.       Relevant Orders   Comprehensive metabolic panel (Completed)     Other   Hyperlipidemia    Under good control on current regimen. Continue current regimen. Continue to monitor. Call with any concerns. Refills given. Labs drawn today.        Relevant Medications   losartan (COZAAR) 25 MG tablet   metoprolol  succinate (TOPROL-XL) 50 MG 24 hr tablet   Other Relevant Orders   Comprehensive metabolic panel (Completed)   Lipid Panel w/o Chol/HDL Ratio (Completed)   Morbid obesity due to excess calories ([place])    Continue to work on diet and exercise with goal of losing 1-2lbs per week. Call with any concerns.       Relevant Medications   empagliflozin (JARDIANCE) 25 MG TABS tablet   Other Relevant Orders   Comprehensive metabolic panel (Completed)   Carcinoma of lower-inner quadrant of left breast in female, estrogen receptor positive ([place])    Continue to follow with oncology. Tolerating arimedex. Call with any concerns.        Other Visit Diagnoses    Pain in both knees, unspecified chronicity       Follow back up with ortho. Call with any concerns.    Relevant Orders   Ambulatory referral to Orthopedic Surgery   Flu vaccine need       Flu shot given today.    Relevant Orders   Flu Vaccine QUAD High Dose(Fluad) (Completed)       Follow up plan: Return in about 6 weeks (around 02/13/2019).

## 2019-01-03 LAB — LIPID PANEL W/O CHOL/HDL RATIO
Cholesterol, Total: 138 mg/dL (ref 100–199)
HDL: 46 mg/dL (ref >39)
LDL Chol Calc (NIH): 53 mg/dL (ref 0–99)
Triglycerides: 248 mg/dL — ABNORMAL HIGH (ref 0–149)
VLDL Cholesterol Cal: 39 mg/dL (ref 5–40)

## 2019-01-03 LAB — COMPREHENSIVE METABOLIC PANEL
ALT: 11 IU/L (ref 0–32)
AST: 15 IU/L (ref 0–40)
Albumin/Globulin Ratio: 1.7 (ref 1.2–2.2)
Albumin: 4 g/dL (ref 3.7–4.7)
Alkaline Phosphatase: 98 IU/L (ref 39–117)
BUN/Creatinine Ratio: 14 (ref 12–28)
BUN: 22 mg/dL (ref 8–27)
Bilirubin Total: 0.6 mg/dL (ref 0.0–1.2)
CO2: 24 mmol/L (ref 20–29)
Calcium: 8.6 mg/dL — ABNORMAL LOW (ref 8.7–10.3)
Chloride: 101 mmol/L (ref 96–106)
Creatinine, Ser: 1.57 mg/dL — ABNORMAL HIGH (ref 0.57–1.00)
GFR calc Af Amer: 37 mL/min/{1.73_m2} — ABNORMAL LOW (ref >59)
GFR calc non Af Amer: 32 mL/min/{1.73_m2} — ABNORMAL LOW (ref >59)
Globulin, Total: 2.4 g/dL (ref 1.5–4.5)
Glucose: 266 mg/dL — ABNORMAL HIGH (ref 65–99)
Potassium: 5 mmol/L (ref 3.5–5.2)
Sodium: 137 mmol/L (ref 134–144)
Total Protein: 6.4 g/dL (ref 6.0–8.5)

## 2019-01-03 LAB — TSH: TSH: 0.352 u[IU]/mL — ABNORMAL LOW (ref 0.450–4.500)

## 2019-01-05 ENCOUNTER — Telehealth: Payer: Self-pay | Admitting: Family Medicine

## 2019-01-05 ENCOUNTER — Encounter: Payer: Self-pay | Admitting: Family Medicine

## 2019-01-05 DIAGNOSIS — E039 Hypothyroidism, unspecified: Secondary | ICD-10-CM

## 2019-01-05 MED ORDER — LEVOTHYROXINE SODIUM 125 MCG PO TABS: 125.0000 ug | ORAL_TABLET | Freq: Every day | ORAL | 2 refills | Status: DC

## 2019-01-05 NOTE — Assessment & Plan Note (Signed)
Under good control on current regimen. Continue current regimen. Continue to monitor. Call with any concerns. Refills given. Labs drawn today.   

## 2019-01-05 NOTE — Assessment & Plan Note (Signed)
Labs to be drawn today. Await results. Continue to monitor. Call with any concerns.

## 2019-01-05 NOTE — Assessment & Plan Note (Signed)
Not doing well with A1c of 9.0- we will start her on jardiance and recheck in 3 months. Call with any concerns. Continue to monitor.

## 2019-01-05 NOTE — Assessment & Plan Note (Signed)
Will keep BP, sugars and cholesterol under good control. Continue to monitor closely. Continue to follow with cardiology. Call with any concerns.

## 2019-01-05 NOTE — Assessment & Plan Note (Signed)
Rechecking levels today. Await results. Treat as needed. Call with any concerns.  

## 2019-01-05 NOTE — Telephone Encounter (Signed)
Please let her know that her labs look good, but her thyroid is a little over treated- I'd like her to decrease her medicine to 155mcg and we'll recheck it with a lab visit in 6 weeks. Thanks!

## 2019-01-05 NOTE — Assessment & Plan Note (Signed)
Continue to follow with oncology. Tolerating arimedex. Call with any concerns.

## 2019-01-05 NOTE — Assessment & Plan Note (Signed)
Continue to work on diet and exercise with goal of losing 1-2lbs per week. Call with any concerns.  

## 2019-01-06 ENCOUNTER — Encounter: Payer: Self-pay | Admitting: Gastroenterology

## 2019-01-06 ENCOUNTER — Ambulatory Visit (INDEPENDENT_AMBULATORY_CARE_PROVIDER_SITE_OTHER): Payer: Medicare Other | Admitting: Gastroenterology

## 2019-01-06 ENCOUNTER — Other Ambulatory Visit: Payer: Self-pay

## 2019-01-06 VITALS — BP 112/74 | HR 76 | Temp 97.7°F | Ht 66.0 in | Wt 216.5 lb

## 2019-01-06 DIAGNOSIS — R1310 Dysphagia, unspecified: Secondary | ICD-10-CM

## 2019-01-06 DIAGNOSIS — R195 Other fecal abnormalities: Secondary | ICD-10-CM

## 2019-01-06 MED ORDER — BISACODYL EC 5 MG PO TBEC: DELAYED_RELEASE_TABLET | ORAL | 0 refills | Status: DC

## 2019-01-06 MED ORDER — NA SULFATE-K SULFATE-MG SULF 17.5-3.13-1.6 GM/177ML PO SOLN: 354.0000 mL | Freq: Once | ORAL | 0 refills | Status: AC

## 2019-01-06 NOTE — Telephone Encounter (Signed)
Patient notified and verbalized understanding. Appointment already scheduled for 02/13/19.

## 2019-01-06 NOTE — Progress Notes (Signed)
[name], MD [address]  [place]  [place], [place] [address]  Main: [phone]  Fax: [phone]   Primary Care Physician: [name], DO   Chief Complaint  Patient presents with  . Constipation    HPI: Tammy [name] is a 73 y.o. female for follow-up of constipation.  Continues to have some days where she is constipated, but some days where she has loose stools.  Taking Metamucil daily, also uses MiraLAX as needed and both of these together work well for her.  No blood in stool.  Also reports intermittent right lower quadrant abdominal pain that gets better after a bowel movement.  No weight loss.  No nausea or vomiting.  Reports some dysphagia to pills only.  Reports family history of colon cancer in mother and brother  Has completed radiation treatments for breast cancer  She also had an upper GI study done as on a previous visit symptoms she had reported some intermittent nausea.  She states this has resolved.  Upper GI study showed small hiatal hernia, reflux and esophageal spasm.  She reports her reflux is well controlled with omeprazole.  Without the omeprazole she has significant reflux symptoms.  Had a colonoscopy in March 2017 due to chronic diarrhea as well.6 subcentimeter polyps were removed. Terminal ileum was examined and appeared normal. Diverticulosis and internal hemorrhoids were reported. I do not have the pathology report available, but letter sent to patient under letters tab shows repeat was recommended in 5 years due to precancerous polyps.  Patient also had a positive fecal occult blood test in March 2020  Current Outpatient Medications  Medication Sig Dispense Refill  . anastrozole (ARIMIDEX) 1 MG tablet Take 1 tablet (1 mg total) by mouth daily. 30 tablet 4  . apixaban (ELIQUIS) 2.5 MG TABS tablet Take 1 tablet (2.5 mg) by mouth twice daily 180 tablet 3  . Benzonatate (TESSALON PERLES PO) Take 1 capsule by mouth as needed  (cough).    . blood glucose meter kit and supplies KIT Dispense based on patient and insurance preference. Use one time daily as directed, E11.9 1 each 0  . Calcium Carb-Cholecalciferol (CALCIUM 600 + D) 600-200 MG-UNIT TABS Take by mouth daily.    . cholecalciferol (VITAMIN D3) 25 MCG (1000 UT) tablet Take 1,000 Units by mouth daily.    . Dulaglutide (TRULICITY) 1.5 QB/3.4LP SOPN Inject 1.5 mg into the skin every Tuesday. 4 pen 6  . DULoxetine (CYMBALTA) 60 MG capsule TAKE 1 CAPSULE EVERY DAY 90 capsule 1  . empagliflozin (JARDIANCE) 25 MG TABS tablet Take 25 mg by mouth daily before breakfast. 30 tablet 3  . gabapentin (NEURONTIN) 300 MG capsule TAKE 1 CAPSULE BY MOUTH THREE TIMES DAILY 270 capsule 3  . glipiZIDE (GLUCOTROL) 5 MG tablet TAKE ONE TABLET EVERY DAY BEFORE BREAKFAST 90 tablet 1  . isosorbide mononitrate (IMDUR) 60 MG 24 hr tablet TAKE 1 TABLET BY MOUTH AT BEDTIME AND 1/2 TABLET IN THE MORNING 135 tablet 3  . levothyroxine (SYNTHROID) 125 MCG tablet Take 1 tablet (125 mcg total) by mouth daily before breakfast. 30 tablet 2  . losartan (COZAAR) 25 MG tablet Take 1 tablet (25 mg total) by mouth daily. 90 tablet 1  . metoprolol succinate (TOPROL-XL) 50 MG 24 hr tablet TAKE 1 TABLET BY MOUTH DAILY WITH OR IMMEDIATLEY FOLLOWING A MEAL 90 tablet 1  . Multiple Vitamins-Minerals (ZINC PO) Take by mouth daily.    . nitroGLYCERIN (NITROSTAT) 0.4 MG  SL tablet Place 1 tablet (0.4 mg total) under the tongue every 5 (five) minutes as needed for chest pain. 25 tablet 6  . pantoprazole (PROTONIX) 40 MG tablet TAKE 1 TABLET BY MOUTH DAILY AS NEEDED 90 tablet 1  . pregabalin (LYRICA) 150 MG capsule Take 1 capsule (150 mg total) by mouth 2 (two) times daily. 60 capsule 3  . PROAIR HFA 108 (90 Base) MCG/ACT inhaler TAKE 2 PUFFS EVERY 4 HOURS AS NEEDED FORWHEEZING OR SHORTNESS OF BREATH 8.5 g 3  . psyllium (METAMUCIL) 58.6 % packet Take 1 packet by mouth daily as needed (fiber).     . rosuvastatin  (CRESTOR) 40 MG tablet TAKE 1 TABLET BY MOUTH DAILY 90 tablet 1  . trimethoprim (TRIMPEX) 100 MG tablet TAKE 1 TABLET BY MOUTH DAILY 90 tablet 0  . VITAMIN D PO Take by mouth daily. Vitamin D3 1000 mg    . bisacodyl (BISACODYL) 5 MG EC tablet Please take 2 tablets between 1pm and 3pm the day before procedure 2 tablet 0  . Na Sulfate-K Sulfate-Mg Sulf 17.5-3.13-1.6 GM/177ML SOLN Take 354 mLs by mouth once for 1 dose. 354 mL 0   No current facility-administered medications for this visit.     Allergies as of 01/06/2019 - Review Complete 01/06/2019  Allergen Reaction Noted  . Hydrocodone Other (See Comments) 12/29/2016  . Aleve [naproxen sodium]  06/13/2013  . Contrast media [iodinated diagnostic agents]  05/19/2015  . Ioxaglate Other (See Comments) 10/01/2015  . Oxycodone Other (See Comments) 01/15/2014  . Ranexa [ranolazine]  06/13/2013  . Sulfa antibiotics Other (See Comments) 05/19/2015  . Tramadol Other (See Comments) 01/15/2014    ROS:  General: Negative for anorexia, weight loss, fever, chills, fatigue, weakness. ENT: Negative for hoarseness, difficulty swallowing , nasal congestion. CV: Negative for chest pain, angina, palpitations, dyspnea on exertion, peripheral edema.  Respiratory: Negative for dyspnea at rest, dyspnea on exertion, cough, sputum, wheezing.  GI: See history of present illness. GU:  Negative for dysuria, hematuria, urinary incontinence, urinary frequency, nocturnal urination.  Endo: Negative for unusual weight change.    Physical Examination:   BP 112/74 (BP Location: Left Arm, Patient Position: Sitting, Cuff Size: Normal)   Pulse 76   Temp 97.7 F (36.5 C) (Oral)   Ht _0  (1.676 m)   Wt 216 lb 8 oz (98.2 kg)   BMI 34.94 kg/m   General: Well-nourished, well-developed in no acute distress.  Eyes: No icterus. Conjunctivae pink. Mouth: Oropharyngeal mucosa moist and pink , no lesions erythema or exudate. Neck: Supple, Trachea midline Abdomen: Bowel  sounds are normal, nontender, nondistended, no hepatosplenomegaly or masses, no abdominal bruits or hernia , no rebound or guarding.   Extremities: No lower extremity edema. No clubbing or deformities. Neuro: Alert and oriented x 3.  Grossly intact. Skin: Warm and dry, no jaundice.   Psych: Alert and cooperative, normal mood and affect.   Labs: CMP     Component Value Date/Time   NA 137 01/02/2019 1034   NA 140 09/20/2011 1134   K 5.0 01/02/2019 1034   K 4.4 09/20/2011 1134   CL 101 01/02/2019 1034   CL 103 09/20/2011 1134   CO2 24 01/02/2019 1034   CO2 28 09/20/2011 1134   GLUCOSE 266 (H) 01/02/2019 1034   GLUCOSE 250 (H) 12/04/2018 1020   GLUCOSE 139 (H) 09/20/2011 1134   BUN 22 01/02/2019 1034   BUN 17 09/20/2011 1134   CREATININE 1.57 (H) 01/02/2019 1034   CREATININE  1.31 (H) 09/20/2011 1134   CALCIUM 8.6 (L) 01/02/2019 1034   CALCIUM 8.4 (L) 09/20/2011 1134   PROT 6.4 01/02/2019 1034   ALBUMIN 4.0 01/02/2019 1034   AST 15 01/02/2019 1034   ALT 11 01/02/2019 1034   ALKPHOS 98 01/02/2019 1034   BILITOT 0.6 01/02/2019 1034   GFRNONAA 32 (L) 01/02/2019 1034   GFRNONAA 42 (L) 09/20/2011 1134   GFRAA 37 (L) 01/02/2019 1034   GFRAA 48 (L) 09/20/2011 1134   Lab Results  Component Value Date   WBC 8.1 12/04/2018   HGB 13.4 12/04/2018   HCT 43.3 12/04/2018   MCV 90.8 12/04/2018   PLT 228 12/04/2018    Imaging Studies: No results found.  Assessment and Plan:   HARLAN VINAL is a 73 y.o. y/o female here for follow-up of constipation, also reporting dysphagia, history of radiation for breast cancer  Due to now reporting dysphagia, 2 pills only, which patient did not complain of before, and has had recent radiation, EGD was discussed to rule out strictures  Asked to evaluate the thickened rugal folds seen on her upper GI study  Patient was also FOBT positive in March 2020 and is now agreeable with proceeding with colonoscopy.  Will also obtain biopsies for microscopic  colitis given her alternating bowel movements  Continue Metamucil and MiraLAX as needed.  I have discussed alternative options, risks & benefits,  which include, but are not limited to, bleeding, infection, perforation,respiratory complication & drug reaction.  The patient agrees with this plan & written consent will be obtained.       Dr [name]

## 2019-01-10 ENCOUNTER — Telehealth: Payer: Self-pay

## 2019-01-10 NOTE — Telephone Encounter (Signed)
Called patient to confirm patient procedure appointment and to remind patient that they have to go for a COVID test on 01/13/2019.  Patient verbalized understanding

## 2019-01-13 ENCOUNTER — Other Ambulatory Visit: Payer: Self-pay

## 2019-01-13 ENCOUNTER — Other Ambulatory Visit
Admission: RE | Admit: 2019-01-13 | Discharge: 2019-01-13 | Disposition: A | Discharge status: Home / Self Care | Payer: Medicare Other | Source: Ambulatory Visit | Attending: Gastroenterology | Admitting: Gastroenterology

## 2019-01-13 DIAGNOSIS — Z01812 Encounter for preprocedural laboratory examination: Secondary | ICD-10-CM | POA: Diagnosis not present

## 2019-01-13 DIAGNOSIS — Z20828 Contact with and (suspected) exposure to other viral communicable diseases: Secondary | ICD-10-CM | POA: Diagnosis not present

## 2019-01-14 LAB — SARS CORONAVIRUS 2 (TAT 6-24 HRS): SARS Coronavirus 2: NEGATIVE

## 2019-01-16 ENCOUNTER — Encounter
Admission: RE | Disposition: A | Discharge status: Home / Self Care | Payer: Self-pay | Source: Home / Self Care | Attending: Gastroenterology

## 2019-01-16 ENCOUNTER — Telehealth: Payer: Self-pay

## 2019-01-16 ENCOUNTER — Other Ambulatory Visit: Payer: Self-pay

## 2019-01-16 ENCOUNTER — Ambulatory Visit
Admission: RE | Admit: 2019-01-16 | Discharge: 2019-01-16 | Disposition: A | Discharge status: Home / Self Care | Payer: Medicare Other | Attending: Gastroenterology | Admitting: Gastroenterology

## 2019-01-16 ENCOUNTER — Encounter: Payer: Self-pay | Admitting: Certified Registered"

## 2019-01-16 ENCOUNTER — Telehealth: Payer: Self-pay | Admitting: Cardiovascular Disease

## 2019-01-16 DIAGNOSIS — R1310 Dysphagia, unspecified: Secondary | ICD-10-CM | POA: Insufficient documentation

## 2019-01-16 DIAGNOSIS — Z539 Procedure and treatment not carried out, unspecified reason: Secondary | ICD-10-CM | POA: Diagnosis not present

## 2019-01-16 DIAGNOSIS — R195 Other fecal abnormalities: Secondary | ICD-10-CM | POA: Diagnosis not present

## 2019-01-16 SURGERY — COLONOSCOPY WITH PROPOFOL
Anesthesia: General

## 2019-01-16 MED ORDER — PROPOFOL 500 MG/50ML IV EMUL
INTRAVENOUS | Status: AC
  Filled 2019-01-16: qty 50

## 2019-01-16 MED ORDER — SODIUM CHLORIDE 0.9 % IV SOLN: INTRAVENOUS | Status: DC

## 2019-01-16 NOTE — Telephone Encounter (Signed)
   Primary Cardiologist: Ida Rogue, MD  Chart reviewed as part of pre-operative protocol coverage. Given past medical history and time since last visit, based on ACC/AHA guidelines, Tammy [name] would be at acceptable risk for the planned procedure without further cardiovascular testing.   Pt was recently cleared for a different procedure 08/2018 by Dr. [name] with no further testing required. She was doing well from a cardiac perspective when last seen by him.   Per pharmacy, pt takes Eliquis for history of DVT and PE (2015), also has hx of stroke in 2015. SCr 1.57, CrCl 61mL/min. Ok to hold Eliquis for 1 day prior to procedure and resume the day after if safe from a bleeding perspective.  I will route this recommendation to the requesting party via Epic fax function and remove from pre-op pool.  Please call with questions.  Kathyrn Drown, NP 01/16/2019, 12:26 PM

## 2019-01-16 NOTE — Telephone Encounter (Signed)
Per Cardiologist Ok to hold Eliquis for 1 day prior to procedure and resume the day after if safe from a bleeding perspective Scheduled patient for 01/28/2019. Sent paperwork through [place] with new dates and COVID test on 01/24/2019.

## 2019-01-16 NOTE — Telephone Encounter (Signed)
Patient verbalized understanding about coming to pick up the Suprep sample kit. Patient states she will be waiting on out call about scheduled the procedure when we get the Blood thinner clearance backed.

## 2019-01-16 NOTE — Telephone Encounter (Signed)
Pt takes Eliquis for history of DVT and PE (2015), also has hx of stroke in 2015. SCr 1.57, CrCl 71mL/min. Ok to hold Eliquis for 1 day prior to procedure and resume the day after if safe from a bleeding perspective.

## 2019-01-16 NOTE — Addendum Note (Signed)
Addended by: Ulyess Blossom L on: 01/16/2019 01:19 PM   Modules accepted: Orders

## 2019-01-16 NOTE — Telephone Encounter (Signed)
Called and left a message for patient to give [place] a call back. Have a bowel prep sample for patient to pick up for her next procedure.

## 2019-01-16 NOTE — Telephone Encounter (Signed)
° °  [facility] Pre-operative Risk Assessment    Request for surgical clearance:  1. What type of surgery is being performed? Colonoscopy    2. When is this surgery scheduled? Says it has been scheduled and to complete ASAP - date not listed    3. What type of clearance is required (medical clearance vs. Pharmacy clearance to hold med vs. Both)? both  4. Are there any medications that need to be held prior to surgery and how long? Eliquis 2.5 MG - please advise if needed to discontinue and when to restart    5. Practice name and name of physician performing surgery? [facility]     6. What is your office phone number [phone]    7.   What is your office fax number [phone] ATTN: Ashley  8.   Anesthesia type (None, local, MAC, general) ? None listed    Ace Gins 01/16/2019, 11:55 AM  _________________________________________________________________   (provider comments below)

## 2019-01-16 NOTE — Telephone Encounter (Signed)
Sent blood thinner request to cardiologist for the Eliquis. When we get this request back will contact patient to rescheduled colonoscopy.

## 2019-01-18 NOTE — Progress Notes (Signed)
preop request

## 2019-01-21 DIAGNOSIS — M5416 Radiculopathy, lumbar region: Secondary | ICD-10-CM | POA: Diagnosis not present

## 2019-01-21 DIAGNOSIS — M170 Bilateral primary osteoarthritis of knee: Secondary | ICD-10-CM | POA: Diagnosis not present

## 2019-01-22 DIAGNOSIS — H35371 Puckering of macula, right eye: Secondary | ICD-10-CM | POA: Diagnosis not present

## 2019-01-23 ENCOUNTER — Telehealth: Payer: Self-pay

## 2019-01-23 NOTE — Telephone Encounter (Signed)
Called patient to confirm patient procedure appointment and to remind patient that they have to go for a COVID test on 01/24/2019.  Patient verbalized understanding

## 2019-01-24 ENCOUNTER — Other Ambulatory Visit: Payer: Self-pay

## 2019-01-24 ENCOUNTER — Other Ambulatory Visit
Admission: RE | Admit: 2019-01-24 | Discharge: 2019-01-24 | Disposition: A | Discharge status: Home / Self Care | Payer: Medicare Other | Source: Ambulatory Visit | Attending: Gastroenterology | Admitting: Gastroenterology

## 2019-01-24 DIAGNOSIS — Z20828 Contact with and (suspected) exposure to other viral communicable diseases: Secondary | ICD-10-CM | POA: Insufficient documentation

## 2019-01-24 DIAGNOSIS — Z01812 Encounter for preprocedural laboratory examination: Secondary | ICD-10-CM | POA: Insufficient documentation

## 2019-01-25 LAB — SARS CORONAVIRUS 2 (TAT 6-24 HRS): SARS Coronavirus 2: NEGATIVE

## 2019-01-27 MED ORDER — SODIUM CHLORIDE 0.9 % IV SOLN
INTRAVENOUS | Status: DC
  Administered 2019-01-28: 08:00:00 via INTRAVENOUS

## 2019-01-28 ENCOUNTER — Ambulatory Visit
Admission: RE | Admit: 2019-01-28 | Discharge: 2019-01-28 | Disposition: A | Discharge status: Home / Self Care | Payer: Medicare Other | Attending: Gastroenterology | Admitting: Gastroenterology

## 2019-01-28 ENCOUNTER — Encounter: Payer: Self-pay | Admitting: Anesthesiology

## 2019-01-28 ENCOUNTER — Ambulatory Visit: Payer: Medicare Other | Admitting: Anesthesiology

## 2019-01-28 ENCOUNTER — Encounter
Admission: RE | Disposition: A | Discharge status: Home / Self Care | Payer: Self-pay | Source: Home / Self Care | Attending: Gastroenterology

## 2019-01-28 DIAGNOSIS — I5022 Chronic systolic (congestive) heart failure: Secondary | ICD-10-CM | POA: Diagnosis not present

## 2019-01-28 DIAGNOSIS — K2960 Other gastritis without bleeding: Secondary | ICD-10-CM | POA: Insufficient documentation

## 2019-01-28 DIAGNOSIS — Z7984 Long term (current) use of oral hypoglycemic drugs: Secondary | ICD-10-CM | POA: Insufficient documentation

## 2019-01-28 DIAGNOSIS — K921 Melena: Secondary | ICD-10-CM | POA: Insufficient documentation

## 2019-01-28 DIAGNOSIS — Z882 Allergy status to sulfonamides status: Secondary | ICD-10-CM | POA: Insufficient documentation

## 2019-01-28 DIAGNOSIS — K219 Gastro-esophageal reflux disease without esophagitis: Secondary | ICD-10-CM | POA: Insufficient documentation

## 2019-01-28 DIAGNOSIS — Z6834 Body mass index (BMI) 34.0-34.9, adult: Secondary | ICD-10-CM | POA: Insufficient documentation

## 2019-01-28 DIAGNOSIS — K259 Gastric ulcer, unspecified as acute or chronic, without hemorrhage or perforation: Secondary | ICD-10-CM | POA: Diagnosis not present

## 2019-01-28 DIAGNOSIS — R197 Diarrhea, unspecified: Secondary | ICD-10-CM | POA: Diagnosis not present

## 2019-01-28 DIAGNOSIS — K648 Other hemorrhoids: Secondary | ICD-10-CM | POA: Diagnosis not present

## 2019-01-28 DIAGNOSIS — Z833 Family history of diabetes mellitus: Secondary | ICD-10-CM | POA: Insufficient documentation

## 2019-01-28 DIAGNOSIS — E785 Hyperlipidemia, unspecified: Secondary | ICD-10-CM | POA: Insufficient documentation

## 2019-01-28 DIAGNOSIS — I130 Hypertensive heart and chronic kidney disease with heart failure and stage 1 through stage 4 chronic kidney disease, or unspecified chronic kidney disease: Secondary | ICD-10-CM | POA: Diagnosis not present

## 2019-01-28 DIAGNOSIS — E1151 Type 2 diabetes mellitus with diabetic peripheral angiopathy without gangrene: Secondary | ICD-10-CM | POA: Diagnosis not present

## 2019-01-28 DIAGNOSIS — Z886 Allergy status to analgesic agent status: Secondary | ICD-10-CM | POA: Insufficient documentation

## 2019-01-28 DIAGNOSIS — Z86718 Personal history of other venous thrombosis and embolism: Secondary | ICD-10-CM | POA: Diagnosis not present

## 2019-01-28 DIAGNOSIS — R002 Palpitations: Secondary | ICD-10-CM | POA: Insufficient documentation

## 2019-01-28 DIAGNOSIS — N1330 Unspecified hydronephrosis: Secondary | ICD-10-CM | POA: Insufficient documentation

## 2019-01-28 DIAGNOSIS — K635 Polyp of colon: Secondary | ICD-10-CM

## 2019-01-28 DIAGNOSIS — R195 Other fecal abnormalities: Secondary | ICD-10-CM | POA: Diagnosis not present

## 2019-01-28 DIAGNOSIS — Z825 Family history of asthma and other chronic lower respiratory diseases: Secondary | ICD-10-CM | POA: Insufficient documentation

## 2019-01-28 DIAGNOSIS — E1122 Type 2 diabetes mellitus with diabetic chronic kidney disease: Secondary | ICD-10-CM | POA: Diagnosis not present

## 2019-01-28 DIAGNOSIS — Z8673 Personal history of transient ischemic attack (TIA), and cerebral infarction without residual deficits: Secondary | ICD-10-CM | POA: Insufficient documentation

## 2019-01-28 DIAGNOSIS — Z86010 Personal history of colonic polyps: Secondary | ICD-10-CM | POA: Insufficient documentation

## 2019-01-28 DIAGNOSIS — K5790 Diverticulosis of intestine, part unspecified, without perforation or abscess without bleeding: Secondary | ICD-10-CM | POA: Diagnosis not present

## 2019-01-28 DIAGNOSIS — Z800 Family history of malignant neoplasm of digestive organs: Secondary | ICD-10-CM | POA: Insufficient documentation

## 2019-01-28 DIAGNOSIS — K3189 Other diseases of stomach and duodenum: Secondary | ICD-10-CM | POA: Diagnosis not present

## 2019-01-28 DIAGNOSIS — K317 Polyp of stomach and duodenum: Secondary | ICD-10-CM | POA: Insufficient documentation

## 2019-01-28 DIAGNOSIS — D124 Benign neoplasm of descending colon: Secondary | ICD-10-CM | POA: Diagnosis not present

## 2019-01-28 DIAGNOSIS — R1310 Dysphagia, unspecified: Secondary | ICD-10-CM | POA: Insufficient documentation

## 2019-01-28 DIAGNOSIS — D122 Benign neoplasm of ascending colon: Secondary | ICD-10-CM | POA: Insufficient documentation

## 2019-01-28 DIAGNOSIS — J449 Chronic obstructive pulmonary disease, unspecified: Secondary | ICD-10-CM | POA: Diagnosis not present

## 2019-01-28 DIAGNOSIS — K5730 Diverticulosis of large intestine without perforation or abscess without bleeding: Secondary | ICD-10-CM | POA: Diagnosis not present

## 2019-01-28 DIAGNOSIS — D120 Benign neoplasm of cecum: Secondary | ICD-10-CM | POA: Diagnosis not present

## 2019-01-28 DIAGNOSIS — Z79899 Other long term (current) drug therapy: Secondary | ICD-10-CM | POA: Insufficient documentation

## 2019-01-28 DIAGNOSIS — E669 Obesity, unspecified: Secondary | ICD-10-CM | POA: Insufficient documentation

## 2019-01-28 DIAGNOSIS — Z8249 Family history of ischemic heart disease and other diseases of the circulatory system: Secondary | ICD-10-CM | POA: Insufficient documentation

## 2019-01-28 DIAGNOSIS — K2100 Gastro-esophageal reflux disease with esophagitis, without bleeding: Secondary | ICD-10-CM | POA: Diagnosis not present

## 2019-01-28 DIAGNOSIS — I2510 Atherosclerotic heart disease of native coronary artery without angina pectoris: Secondary | ICD-10-CM | POA: Diagnosis not present

## 2019-01-28 DIAGNOSIS — D126 Benign neoplasm of colon, unspecified: Secondary | ICD-10-CM | POA: Diagnosis not present

## 2019-01-28 DIAGNOSIS — Z888 Allergy status to other drugs, medicaments and biological substances status: Secondary | ICD-10-CM | POA: Insufficient documentation

## 2019-01-28 DIAGNOSIS — K228 Other specified diseases of esophagus: Secondary | ICD-10-CM | POA: Diagnosis not present

## 2019-01-28 DIAGNOSIS — Z87891 Personal history of nicotine dependence: Secondary | ICD-10-CM | POA: Insufficient documentation

## 2019-01-28 DIAGNOSIS — Z86711 Personal history of pulmonary embolism: Secondary | ICD-10-CM | POA: Diagnosis not present

## 2019-01-28 DIAGNOSIS — Z885 Allergy status to narcotic agent status: Secondary | ICD-10-CM | POA: Insufficient documentation

## 2019-01-28 DIAGNOSIS — N183 Chronic kidney disease, stage 3 unspecified: Secondary | ICD-10-CM | POA: Insufficient documentation

## 2019-01-28 DIAGNOSIS — E890 Postprocedural hypothyroidism: Secondary | ICD-10-CM | POA: Insufficient documentation

## 2019-01-28 DIAGNOSIS — Z91041 Radiographic dye allergy status: Secondary | ICD-10-CM | POA: Insufficient documentation

## 2019-01-28 DIAGNOSIS — Z801 Family history of malignant neoplasm of trachea, bronchus and lung: Secondary | ICD-10-CM | POA: Insufficient documentation

## 2019-01-28 DIAGNOSIS — Z9049 Acquired absence of other specified parts of digestive tract: Secondary | ICD-10-CM | POA: Insufficient documentation

## 2019-01-28 DIAGNOSIS — I5032 Chronic diastolic (congestive) heart failure: Secondary | ICD-10-CM | POA: Diagnosis not present

## 2019-01-28 DIAGNOSIS — Z7901 Long term (current) use of anticoagulants: Secondary | ICD-10-CM | POA: Insufficient documentation

## 2019-01-28 DIAGNOSIS — K2289 Other specified disease of esophagus: Secondary | ICD-10-CM

## 2019-01-28 HISTORY — PX: COLONOSCOPY WITH PROPOFOL: SHX5780

## 2019-01-28 HISTORY — PX: ESOPHAGOGASTRODUODENOSCOPY (EGD) WITH PROPOFOL: SHX5813

## 2019-01-28 LAB — KOH PREP

## 2019-01-28 SURGERY — COLONOSCOPY WITH PROPOFOL
Anesthesia: General

## 2019-01-28 MED ORDER — FENTANYL CITRATE (PF) 100 MCG/2ML IJ SOLN
INTRAMUSCULAR | Status: AC
  Filled 2019-01-28: qty 2

## 2019-01-28 MED ORDER — MIDAZOLAM HCL 2 MG/2ML IJ SOLN
INTRAMUSCULAR | Status: DC | PRN
  Administered 2019-01-28: 1 mg via INTRAVENOUS

## 2019-01-28 MED ORDER — ONDANSETRON HCL 4 MG/2ML IJ SOLN
INTRAMUSCULAR | Status: DC | PRN
  Administered 2019-01-28: 4 mg via INTRAVENOUS

## 2019-01-28 MED ORDER — LIDOCAINE HCL (PF) 2 % IJ SOLN
INTRAMUSCULAR | Status: AC
  Filled 2019-01-28: qty 10

## 2019-01-28 MED ORDER — MIDAZOLAM HCL 2 MG/2ML IJ SOLN
INTRAMUSCULAR | Status: AC
  Filled 2019-01-28: qty 2

## 2019-01-28 MED ORDER — PROPOFOL 500 MG/50ML IV EMUL
INTRAVENOUS | Status: DC | PRN
  Administered 2019-01-28: 100 ug/kg/min via INTRAVENOUS

## 2019-01-28 MED ORDER — PROPOFOL 500 MG/50ML IV EMUL
INTRAVENOUS | Status: AC
  Filled 2019-01-28: qty 50

## 2019-01-28 MED ORDER — EPHEDRINE SULFATE 50 MG/ML IJ SOLN
INTRAMUSCULAR | Status: AC
  Filled 2019-01-28: qty 1

## 2019-01-28 MED ORDER — BUTAMBEN-TETRACAINE-BENZOCAINE 2-2-14 % EX AERO
INHALATION_SPRAY | CUTANEOUS | Status: AC
  Filled 2019-01-28: qty 5

## 2019-01-28 MED ORDER — FENTANYL CITRATE (PF) 100 MCG/2ML IJ SOLN
INTRAMUSCULAR | Status: DC | PRN
  Administered 2019-01-28: 50 ug via INTRAVENOUS
  Administered 2019-01-28 (×2): 25 ug via INTRAVENOUS

## 2019-01-28 MED ORDER — PROPOFOL 10 MG/ML IV BOLUS
INTRAVENOUS | Status: AC
  Filled 2019-01-28: qty 20

## 2019-01-28 MED ORDER — LIDOCAINE HCL (CARDIAC) PF 100 MG/5ML IV SOSY
PREFILLED_SYRINGE | INTRAVENOUS | Status: DC | PRN
  Administered 2019-01-28: 50 mg via INTRAVENOUS

## 2019-01-28 MED ORDER — ONDANSETRON HCL 4 MG/2ML IJ SOLN
INTRAMUSCULAR | Status: AC
  Filled 2019-01-28: qty 2

## 2019-01-28 MED ORDER — EPHEDRINE SULFATE 50 MG/ML IJ SOLN
INTRAMUSCULAR | Status: DC | PRN
  Administered 2019-01-28: 5 mg via INTRAVENOUS

## 2019-01-28 NOTE — Op Note (Signed)
[facility] Patient Name: Tammy [name] Procedure Date: 01/28/2019 8:38 AM MRN: [id number] Account #: [id number] Date of Birth: Jan 04, 1946 Admit Type: Outpatient Age: 73 Room: [facility] ENDO ROOM 2 Gender: Female Note Status: Finalized Procedure:             Upper GI endoscopy Indications:           Dysphagia Providers:             Zandrea Kenealy B. Bonna Gains MD, MD Referring MD:          [name] (Referring MD) Medicines:             Monitored Anesthesia Care Complications:         No immediate complications. Procedure:             Pre-Anesthesia Assessment:                        - Prior to the procedure, a History and Physical was                         performed, and patient medications, allergies and                         sensitivities were reviewed. The patient's tolerance                         of previous anesthesia was reviewed.                        - The risks and benefits of the procedure and the                         sedation options and risks were discussed with the                         patient. All questions were answered and informed                         consent was obtained.                        - Patient identification and proposed procedure were                         verified prior to the procedure by the physician, the                         nurse, the anesthesiologist, the anesthetist and the                         technician. The procedure was verified in the                         procedure room.                        - ASA Grade Assessment: II - A patient with mild                         systemic disease.  After obtaining informed consent, the endoscope was                         passed under direct vision. Throughout the procedure,                         the patient's blood pressure, pulse, and oxygen                         saturations were monitored continuously. The Endoscope                      was introduced through the mouth, and advanced to the                         third part of duodenum. The upper GI endoscopy was                         accomplished with ease. The patient tolerated the                         procedure well. Findings:      White nummular lesions were noted in the proximal esophagus. Brushings       for KOH prep were obtained.      There is no endoscopic evidence of esophagitis, stenosis or stricture in       the entire esophagus. Biopsies were obtained from the proximal and       distal esophagus with cold forceps for histology of suspected       eosinophilic esophagitis.      Patchy mildly erythematous mucosa without bleeding was found in the       gastric antrum. Biopsies were taken with a cold forceps for histology.       Biopsies were obtained in the gastric body, at the incisura and in the       gastric antrum with cold forceps for histology.      Localized mild mucosal changes characterized by thickened folds were       found in the gastric antrum. Biopsies were taken with a cold forceps for       histology.      Multiple 4 to 6 mm sessile polyps with no bleeding and no stigmata of       recent bleeding were found in the stomach. Biopsies were taken with a       cold forceps for histology.      The duodenal bulb, second portion of the duodenum and examined duodenum       were normal. Impression:            - White nummular lesions in esophageal mucosa.                         Brushings performed.                        - Erythematous mucosa in the antrum. Biopsied.                        - Thickened folds mucosa in the antrum. Biopsied.                        -  Multiple gastric polyps. Biopsied.                        - Normal duodenal bulb, second portion of the duodenum                         and examined duodenum.                        - Biopsies were obtained in the gastric body, at the                         incisura  and in the gastric antrum. Recommendation:        - Await pathology results.                        - Discharge patient to home (with escort).                        - Advance diet as tolerated.                        - Continue present medications.                        - Patient has a contact number available for                         emergencies. The signs and symptoms of potential                         delayed complications were discussed with the patient.                         Return to normal activities tomorrow. Written                         discharge instructions were provided to the patient.                        - Discharge patient to home (with escort).                        - The findings and recommendations were discussed with                         the patient.                        - The findings and recommendations were discussed with                         the patient's family. Procedure Code(s):     --- Professional ---                        [phone], Esophagogastroduodenoscopy, flexible,                         transoral; with biopsy, single or multiple Diagnosis Code(s):     --- Professional ---  K22.8, Other specified diseases of esophagus                        K31.89, Other diseases of stomach and duodenum                        K31.7, Polyp of stomach and duodenum                        R13.10, Dysphagia, unspecified CPT copyright 2019 American Medical Association. All rights reserved. The codes documented in this report are preliminary and upon coder review may  be revised to meet current compliance requirements.  [name], MD Margretta Sidle B. Bonna Gains MD, MD 01/28/2019 9:45:25 AM This report has been signed electronically. Number of Addenda: 0 Note Initiated On: 01/28/2019 8:38 AM Estimated Blood Loss:  Estimated blood loss: none.      [facility]

## 2019-01-28 NOTE — Anesthesia Preprocedure Evaluation (Signed)
Anesthesia Evaluation  Patient identified by MRN, date of birth, ID band Patient awake    Reviewed: Allergy & Precautions, NPO status , Patient's Chart, lab work & pertinent test results  History of Anesthesia Complications (+) PONV and history of anesthetic complications  Airway Mallampati: II  TM Distance: <3 FB Neck ROM: limited    Dental   Pulmonary asthma , neg sleep apnea, COPD,  COPD inhaler, neg recent URI, Not current smoker, former smoker,           Cardiovascular hypertension, Pt. on medications + CAD, + Peripheral Vascular Disease and +CHF  (-) Past MI (-) dysrhythmias (-) Valvular Problems/Murmurs     Neuro/Psych neg Seizures TIA (speech difficulties) Neuromuscular disease CVA    GI/Hepatic Neg liver ROS, GERD  Medicated,  Endo/Other  diabetes, Type 2, Oral Hypoglycemic AgentsHypothyroidism   Renal/GU Renal disease (hydronephrosis)     Musculoskeletal   Abdominal   Peds  Hematology   Anesthesia Other Findings Past Medical History: No date: Asthma No date: Benign neoplasm of colon No date: Cervical disc disease     Comment:  "bulging" - no limitations per pt No date: Chronic diastolic CHF (congestive heart failure) ([place])     Comment:  a. 07/2012 Echo: Nl EF. mild inferoseptal HK, Gr 2 DD,               mild conc LVH, mild PR/TR, mild PAH. No date: CKD (chronic kidney disease), stage III No date: COPD (chronic obstructive pulmonary disease) (H[facility]) No date: Coronary artery disease     Comment:  a. 11/2011 Cath: LAD 36m/d, LCX min irregs, RCA 70p, 172m              with L->R collats, EF 60%-->Med Rx. No date: Diabetes mellitus without complication ([place]) 02/40/9735: Fusion of lumbar spine No date: GERD (gastroesophageal reflux disease) No date: History of DVT (deep vein thrombosis) No date: History of tobacco abuse     Comment:  a. Quit 2011. No date: Hyperlipidemia No date: Hypertension No date:  Hypothyroidism No date: Obesity No date: Palpitations 10/15: PE (pulmonary embolism) No date: PONV (postoperative nausea and vomiting) 12/25/2013: Pulmonary embolus (HCC)     Comment:  RLL. Presented with SOB and elevated d-dimer.  No date: PVD (peripheral vascular disease) ([place])     Comment:  a. PTA of right leg and left femoral artery with               stenosis. No date: Renal insufficiency 2015: Stroke [facility])     Comment:  TIAs - no deficits   Reproductive/Obstetrics                             Anesthesia Physical  Anesthesia Plan  ASA: III  Anesthesia Plan: General   Post-op Pain Management:    Induction: Intravenous  PONV Risk Score and Plan: 4 or greater and Propofol infusion, TIVA and Treatment may vary due to age or medical condition  Airway Management Planned: Nasal Cannula  Additional Equipment:   Intra-op Plan:   Post-operative Plan:   Informed Consent: I have reviewed the patients History and Physical, chart, labs and discussed the procedure including the risks, benefits and alternatives for the proposed anesthesia with the patient or authorized representative who has indicated his/her understanding and acceptance.     Dental Advisory Given  Plan Discussed with: Anesthesiologist, CRNA and Surgeon  Anesthesia Plan Comments: (Patient consented for risks of  anesthesia including but not limited to:  - adverse reactions to medications - risk of intubation if required - damage to teeth, lips or other oral mucosa - sore throat or hoarseness - Damage to heart, brain, lungs or loss of life  Patient voiced understanding.)        Anesthesia Quick Evaluation

## 2019-01-28 NOTE — Anesthesia Postprocedure Evaluation (Signed)
Anesthesia Post Note  Patient: DAUN RENS  Procedure(s) Performed: COLONOSCOPY WITH PROPOFOL (N/A ) ESOPHAGOGASTRODUODENOSCOPY (EGD) WITH PROPOFOL (N/A )  Patient location during evaluation: Endoscopy Anesthesia Type: General Level of consciousness: awake and alert Pain management: pain level controlled Vital Signs Assessment: post-procedure vital signs reviewed and stable Respiratory status: spontaneous breathing, nonlabored ventilation, respiratory function stable and patient connected to nasal cannula oxygen Cardiovascular status: blood pressure returned to baseline and stable Postop Assessment: no apparent nausea or vomiting Anesthetic complications: no     Last Vitals:  Vitals:   01/28/19 0806 01/28/19 1024  BP: 130/80 (!) 116/58  Pulse: 85   Resp: 16   Temp: 36.4 C (!) 35.9 C  SpO2: 97%     Last Pain:  Vitals:   01/28/19 1024  TempSrc: Temporal  PainSc:                  Precious Haws Murle Hellstrom

## 2019-01-28 NOTE — Progress Notes (Signed)
esohageal brushing for KOH to lab

## 2019-01-28 NOTE — Anesthesia Procedure Notes (Signed)
Performed by: Cook-Martin, Talicia Sui Pre-anesthesia Checklist: Patient identified, Emergency Drugs available, Suction available, Patient being monitored and Timeout performed Patient Re-evaluated:Patient Re-evaluated prior to induction Oxygen Delivery Method: Nasal cannula Preoxygenation: Pre-oxygenation with 100% oxygen Induction Type: IV induction Airway Equipment and Method: Bite block Placement Confirmation: CO2 detector and positive ETCO2       

## 2019-01-28 NOTE — H&P (Signed)
[name], MD [address] [place], [place], [place], [address] [address], [place], [place], [place], [address] Phone: [phone]  Fax: [phone]  Primary Care Physician:  [name], DO   Pre-Procedure History & Physical: HPI:  Tammy [name] is a 73 y.o. female is here for a colonoscopy and EGD.   Past Medical History:  Diagnosis Date  . Asthma   . Benign neoplasm of colon   . Cervical disc disease    "bulging" - no limitations per pt  . Chronic diastolic CHF (congestive heart failure) ([place])    a. 07/2012 Echo: Nl EF. mild inferoseptal HK, Gr 2 DD, mild conc LVH, mild PR/TR, mild PAH.  . CKD (chronic kidney disease), stage III   . COPD (chronic obstructive pulmonary disease) ([place])   . Coronary artery disease    a. 11/2011 Cath: LAD 66md, LCX min irregs, RCA 70p, 1085mith L->R collats, EF 60%-->Med Rx.  . Diabetes mellitus without complication ([place]  . Fusion of lumbar spine 12/22/2015  . GERD (gastroesophageal reflux disease)   . History of DVT (deep vein thrombosis)   . History of tobacco abuse    a. Quit 2011.  . Marland Kitchenyperlipidemia   . Hypertension   . Hypothyroidism   . Obesity   . Palpitations   . PE (pulmonary embolism) 10/15  . PONV (postoperative nausea and vomiting)   . Pulmonary embolus ([facility] [place]   RLL. Presented with SOB and elevated d-dimer.   . Marland KitchenVD (peripheral vascular disease) ([place]   a. PTA of right leg and left femoral artery with stenosis.  . Renal insufficiency   . [facility]   TIAs - no deficits    Past Surgical History:  Procedure Laterality Date  . ANGIOPLASTY / STENTING FEMORAL     PVD; angioplasty right leg and left femoral artery with stenosis.   . APPENDECTOMY  Oct. 2016  . BACK SURGERY  03/2012  . BACK SURGERY     screws, rods replaced with new hardware.  . Marland KitchenACK SURGERY  11/2016  . BREAST SURGERY    . CARDIAC CATHETERIZATION  12/07/2011   Mid LAD 50%, distal LAD 50%, mid RCA 70%, distal RCA 100%    . CARDIAC CATHETERIZATION  01/04/2011   100% occluded mid RCA with good collaterals from distal LAD, normal LVEF.  . Marland KitchenHOLECYSTECTOMY    . COLONOSCOPY  2013  . COLONOSCOPY WITH PROPOFOL N/A 05/24/2015   Procedure: COLONOSCOPY WITH PROPOFOL;  Surgeon: DaLucilla LameMD;  [facility] Service[facility];  Laterality: N/A;  Diabetic - oral meds  . DIAGNOSTIC LAPAROSCOPY    . LAPAROSCOPIC APPENDECTOMY N/A 01/06/2015   Procedure: APPENDECTOMY LAPAROSCOPIC;  Surgeon: SeChristene LyeMD;  Location: [facility] ORS;  Service: General;  Laterality: N/A;  . MASTECTOMY    . MASTECTOMY W/ SENTINEL NODE BIOPSY Left 08/26/2018   Procedure: PARTIAL MASTECTOMY WIDE EXCISION WITH SENTINEL LYMPH NODE BIOPSY LEFT;  Surgeon: ByRobert BellowMD;  Location: [facility] ORS;  Service: General;  Laterality: Left;  . POLYPECTOMY  05/24/2015   Procedure: POLYPECTOMY;  Surgeon: DaLucilla LameMD;  [place] [facility] Service: Endoscopy;;  . THYROIDECTOMY    . TRIGGER FINGER RELEASE      Prior to Admission medications   Medication Sig Start Date End Date Taking? Authorizing Provider  apixaban (ELIQUIS) 2.5 MG TABS tablet Take 1 tablet (2.5 mg) by mouth twice daily 08/29/18  Yes Byrnett, JeForest GleasonMD  bisacodyl (BISACODYL) 5 MG EC tablet Please take 2 tablets between 1pm and 3pm the day before procedure 01/06/19  Yes Aradhya Shellenbarger, Lennette Bihari, MD  blood glucose meter kit and supplies KIT Dispense based on patient and insurance preference. Use one time daily as directed, E11.9 04/23/15  Yes Lada, Satira Anis, MD  Calcium Carb-Cholecalciferol (CALCIUM 600 + D) 600-200 MG-UNIT TABS Take by mouth daily.   Yes [provider]  cholecalciferol (VITAMIN D3) 25 MCG (1000 UT) tablet Take 1,000 Units by mouth daily.   Yes [provider]  Dulaglutide (TRULICITY) 1.5 WU/9.8JX SOPN Inject 1.5 mg into the skin every Tuesday. 10/01/18  Yes Johnson, Megan P, DO  DULoxetine (CYMBALTA) 60 MG capsule TAKE 1  CAPSULE EVERY DAY 11/14/18  Yes Johnson, Megan P, DO  empagliflozin (JARDIANCE) 25 MG TABS tablet Take 25 mg by mouth daily before breakfast. 01/02/19  Yes Johnson, Megan P, DO  gabapentin (NEURONTIN) 300 MG capsule TAKE 1 CAPSULE BY MOUTH THREE TIMES DAILY 10/21/18  Yes Johnson, Megan P, DO  glipiZIDE (GLUCOTROL) 5 MG tablet TAKE ONE TABLET EVERY DAY BEFORE BREAKFAST 11/14/18  Yes Johnson, Megan P, DO  isosorbide mononitrate (IMDUR) 60 MG 24 hr tablet TAKE 1 TABLET BY MOUTH AT BEDTIME AND 1/2 TABLET IN THE MORNING 12/05/18  Yes Johnson, Megan P, DO  levothyroxine (SYNTHROID) 125 MCG tablet Take 1 tablet (125 mcg total) by mouth daily before breakfast. 01/05/19  Yes Johnson, Megan P, DO  losartan (COZAAR) 25 MG tablet Take 1 tablet (25 mg total) by mouth daily. 01/02/19  Yes Johnson, Megan P, DO  metoprolol succinate (TOPROL-XL) 50 MG 24 hr tablet TAKE 1 TABLET BY MOUTH DAILY WITH OR IMMEDIATLEY FOLLOWING A MEAL 01/02/19  Yes Johnson, Megan P, DO  Multiple Vitamins-Minerals (ZINC PO) Take by mouth daily.   Yes [provider]  pantoprazole (PROTONIX) 40 MG tablet TAKE 1 TABLET BY MOUTH DAILY AS NEEDED 12/02/18  Yes Johnson, Megan P, DO  pregabalin (LYRICA) 150 MG capsule Take 1 capsule (150 mg total) by mouth 2 (two) times daily. 11/26/18  Yes Johnson, Megan P, DO  psyllium (METAMUCIL) 58.6 % packet Take 1 packet by mouth daily as needed (fiber).    Yes [provider]  rosuvastatin (CRESTOR) 40 MG tablet TAKE 1 TABLET BY MOUTH DAILY 08/21/18  Yes Johnson, Megan P, DO  trimethoprim (TRIMPEX) 100 MG tablet TAKE 1 TABLET BY MOUTH DAILY 12/31/18  Yes Stoioff, Ronda Fairly, MD  VITAMIN D PO Take by mouth daily. Vitamin D3 1000 mg   Yes [provider]  anastrozole (ARIMIDEX) 1 MG tablet Take 1 tablet (1 mg total) by mouth daily. 12/04/18   Cammie Sickle, MD  Benzonatate (TESSALON PERLES PO) Take 1 capsule by mouth as needed (cough).    [provider]  nitroGLYCERIN  (NITROSTAT) 0.4 MG SL tablet Place 1 tablet (0.4 mg total) under the tongue every 5 (five) minutes as needed for chest pain. 06/04/17   [name], MD  PROAIR HFA 108 [phone] Base) MCG/ACT inhaler TAKE 2 PUFFS EVERY 4 HOURS AS NEEDED FORWHEEZING OR SHORTNESS OF BREATH 12/05/18   Park Liter P, DO    Allergies as of 01/16/2019 - Review Complete 01/06/2019  Allergen Reaction Noted  . Hydrocodone Other (See Comments) 12/29/2016  . Aleve [naproxen sodium]  06/13/2013  . Contrast media [iodinated diagnostic agents]  05/19/2015  . Ioxaglate Other (See Comments) 10/01/2015  . Oxycodone Other (See Comments) 01/15/2014  . Ranexa [ranolazine]  06/13/2013  .  Sulfa antibiotics Other (See Comments) 05/19/2015  . Tramadol Other (See Comments) 01/15/2014    Family History  Problem Relation Age of Onset  . Hypertension Father   . Heart disease Father   . Heart attack Father   . Diabetes Father   . Cancer Mother        colon  . Heart attack Brother   . Diabetes Brother   . Hypertension Brother   . Heart disease Brother   . Heart attack Brother   . Diabetes Brother   . Hypertension Brother   . Heart disease Brother   . Heart attack Brother   . Diabetes Brother   . Hypertension Brother   . Heart disease Brother   . Cancer Sister        lung  . COPD Sister   . COPD Sister   . Breast cancer Neg Hx     Social History   Socioeconomic History  . Marital status: Married    Spouse name: Not on file  . Number of children: 2  . Years of education: some college, Production assistant, radio   . Highest education level: Associate degree: occupational, Hotel manager, or vocational program  Occupational History  . Occupation: Retired  Scientific laboratory technician  . Financial resource strain: Not hard at all  . Food insecurity    Worry: Never true    Inability: Never true  . Transportation needs    Medical: No    Non-medical: No  Tobacco Use  . Smoking status: Former Smoker    Packs/day: 0.50     Years: 15.00    Pack years: 7.50    Types: Cigarettes    Quit date: 03/14/2007    Years since quitting: 11.8  . Smokeless tobacco: Never Used  Substance and Sexual Activity  . Alcohol use: No    Alcohol/week: 0.0 standard drinks  . Drug use: Never  . Sexual activity: Yes    Birth control/protection: Post-menopausal  Lifestyle  . Physical activity    Days per week: 0 days    Minutes per session: 0 min  . Stress: Not at all  Relationships  . Social connections    Talks on phone: More than three times a week    Gets together: More than three times a week    Attends religious service: 1 to 4 times per year    Active member of club or organization: No    Attends meetings of clubs or organizations: Never    Relationship status: Married  . Intimate partner violence    Fear of current or ex partner: No    Emotionally abused: No    Physically abused: No    Forced sexual activity: No  Other Topics Concern  . Not on file  Social History Narrative   Lives at home with husband in [place]. Quit smoking 10 years ago; never alcohol. 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     Review of Systems: See HPI, otherwise negative ROS  Physical Exam: BP 130/80   Pulse 85   Temp 97.6 F (36.4 C) (Temporal)   Resp 16   Ht 5' 6"  (1.676 m)   Wt 93.9 kg   SpO2 97%   BMI 33.41 kg/m  General:   Alert,  pleasant and cooperative in NAD Head:  Normocephalic and atraumatic. Neck:  Supple; no masses or thyromegaly. Lungs:  Clear throughout to auscultation, normal respiratory effort.  Heart:  +S1, +S2, Regular rate and rhythm, No edema. Abdomen:  Soft, nontender and nondistended. Normal bowel sounds, without guarding, and without rebound.   Neurologic:  Alert and  oriented x4;  grossly normal neurologically.  Impression/Plan: [name] is here for a colonoscopy to be performed for positive FOBT and loose stools and EGD for dysphagia  Risks,  benefits, limitations, and alternatives regarding the procedures have been reviewed with the patient.  Questions have been answered.  All parties agreeable.   [name], MD  01/28/2019, 8:41 AM

## 2019-01-28 NOTE — Anesthesia Post-op Follow-up Note (Signed)
Anesthesia QCDR form completed.        

## 2019-01-28 NOTE — Op Note (Signed)
[facility] Patient Name: Tammy [name] Procedure Date: 01/28/2019 8:37 AM MRN: [id number] Account #: [id number] Date of Birth: 1945/10/11 Admit Type: Outpatient Age: 73 Room: [facility] ENDO ROOM 2 Gender: Female Note Status: Finalized Procedure:             Colonoscopy Indications:           Clinically significant diarrhea of unexplained origin,                         Gastrointestinal occult blood loss Providers:             Katha Kuehne B. Bonna Gains MD, MD Referring MD:          [name] (Referring MD) Medicines:             Monitored Anesthesia Care Complications:         No immediate complications. Procedure:             Pre-Anesthesia Assessment:                        - ASA Grade Assessment: II - A patient with mild                         systemic disease.                        - Prior to the procedure, a History and Physical was                         performed, and patient medications, allergies and                         sensitivities were reviewed. The patient's tolerance                         of previous anesthesia was reviewed.                        - The risks and benefits of the procedure and the                         sedation options and risks were discussed with the                         patient. All questions were answered and informed                         consent was obtained.                        - Patient identification and proposed procedure were                         verified prior to the procedure by the physician, the                         nurse, the anesthesiologist, the anesthetist and the                         technician. The procedure was  verified in the                         procedure room.                        After obtaining informed consent, the colonoscope was                         passed under direct vision. Throughout the procedure,                         the patient's blood pressure,  pulse, and oxygen                         saturations were monitored continuously. The                         Colonoscope was introduced through the anus and                         advanced to the the cecum, identified by appendiceal                         orifice and ileocecal valve. The colonoscopy was                         performed with ease. The patient tolerated the                         procedure well. The quality of the bowel preparation                         was fair. Findings:      The perianal and digital rectal examinations were normal.      Four sessile polyps were found in the ascending colon and cecum. The       polyps were 4 to 5 mm in size. These polyps were removed with a cold       biopsy forceps. Resection and retrieval were complete.      Two sessile polyps were found in the descending colon. The polyps were 3       to 4 mm in size. These polyps were removed with a cold biopsy forceps.       Resection and retrieval were complete.      Two sessile polyps were found in the descending colon. The polyps were 6       to 7 mm in size. These polyps were removed with a cold snare. Resection       and retrieval were complete.      Multiple diverticula were found in the sigmoid colon.      The exam was otherwise without abnormality.      The rectum, sigmoid colon, descending colon, transverse colon, ascending       colon and cecum appeared normal.      Non-bleeding internal hemorrhoids were found during retroflexion. Impression:            - Preparation of the colon was fair.                        -  Four 4 to 5 mm polyps in the ascending colon and in                         the cecum, removed with a cold biopsy forceps.                         Resected and retrieved.                        - Two 3 to 4 mm polyps in the descending colon,                         removed with a cold biopsy forceps. Resected and                         retrieved.                         - Two 6 to 7 mm polyps in the descending colon,                         removed with a cold snare. Resected and retrieved.                        - Diverticulosis in the sigmoid colon.                        - The examination was otherwise normal.                        - The rectum, sigmoid colon, descending colon,                         transverse colon, ascending colon and cecum are normal.                        - Non-bleeding internal hemorrhoids. Recommendation:        - Discharge patient to home (with escort).                        - High fiber diet.                        - Advance diet as tolerated.                        - Continue present medications.                        - Await pathology results.                        - Repeat colonoscopy in 1 year, with 2 day prep.                        - The findings and recommendations were discussed with                         the patient.                        -  The findings and recommendations were discussed with                         the patient's family.                        - Return to primary care physician as previously                         scheduled. Procedure Code(s):     --- Professional ---                        [phone], Colonoscopy, flexible; with removal of                         tumor(s), polyp(s), or other lesion(s) by snare                         technique                        45380, 88, Colonoscopy, flexible; with biopsy, single                         or multiple Diagnosis Code(s):     --- Professional ---                        K64.8, Other hemorrhoids                        K63.5, Polyp of colon                        R19.7, Diarrhea, unspecified                        R19.5, Other fecal abnormalities                        K57.30, Diverticulosis of large intestine without                         perforation or abscess without bleeding CPT copyright 2019 American Medical Association. All rights  reserved. The codes documented in this report are preliminary and upon coder review may  be revised to meet current compliance requirements.  [name], MD Margretta Sidle B. Bonna Gains MD, MD 01/28/2019 10:39:09 AM This report has been signed electronically. Number of Addenda: 0 Note Initiated On: 01/28/2019 8:37 AM Scope Withdrawal Time: 0 hours 24 minutes 21 seconds  Total Procedure Duration: 0 hours 30 minutes 28 seconds  Estimated Blood Loss:  Estimated blood loss: none.      [facility]

## 2019-01-28 NOTE — Transfer of Care (Signed)
Immediate Anesthesia Transfer of Care Note  Patient: Tammy [name]  Procedure(s) Performed: COLONOSCOPY WITH PROPOFOL (N/A ) ESOPHAGOGASTRODUODENOSCOPY (EGD) WITH PROPOFOL (N/A )  Patient Location: PACU  Anesthesia Type:General  Level of Consciousness: awake and sedated  Airway & Oxygen Therapy: Patient Spontanous Breathing and Patient connected to nasal cannula oxygen  Post-op Assessment: Report given to RN and Post -op Vital signs reviewed and stable  Post vital signs: Reviewed and stable  Last Vitals:  Vitals Value Taken Time  BP    Temp    Pulse    Resp    SpO2      Last Pain:  Vitals:   01/28/19 0806  TempSrc: Temporal  PainSc: 0-No pain         Complications: No apparent anesthesia complications

## 2019-01-29 ENCOUNTER — Encounter: Payer: Self-pay | Admitting: Gastroenterology

## 2019-01-29 LAB — SURGICAL PATHOLOGY

## 2019-01-31 DIAGNOSIS — M545 Low back pain: Secondary | ICD-10-CM | POA: Diagnosis not present

## 2019-01-31 DIAGNOSIS — G8929 Other chronic pain: Secondary | ICD-10-CM | POA: Diagnosis not present

## 2019-02-03 ENCOUNTER — Other Ambulatory Visit: Payer: Self-pay | Admitting: Student

## 2019-02-03 ENCOUNTER — Other Ambulatory Visit: Payer: Self-pay

## 2019-02-03 DIAGNOSIS — M545 Low back pain, unspecified: Secondary | ICD-10-CM

## 2019-02-03 DIAGNOSIS — G8929 Other chronic pain: Secondary | ICD-10-CM

## 2019-02-03 NOTE — Progress Notes (Signed)
Patient pre screened for office appointment, no questions or concerns today. Patient reminded of upcoming appointment time and date. 

## 2019-02-04 ENCOUNTER — Inpatient Hospital Stay (HOSPITAL_BASED_OUTPATIENT_CLINIC_OR_DEPARTMENT_OTHER): Payer: Medicare Other | Admitting: Internal Medicine

## 2019-02-04 ENCOUNTER — Other Ambulatory Visit: Payer: Self-pay

## 2019-02-04 ENCOUNTER — Inpatient Hospital Stay: Payer: Medicare Other | Attending: Internal Medicine

## 2019-02-04 VITALS — BP 142/100 | HR 72 | Temp 98.8°F | Resp 22 | Ht 66.0 in | Wt 214.0 lb

## 2019-02-04 DIAGNOSIS — N189 Chronic kidney disease, unspecified: Secondary | ICD-10-CM | POA: Insufficient documentation

## 2019-02-04 DIAGNOSIS — Z9012 Acquired absence of left breast and nipple: Secondary | ICD-10-CM | POA: Diagnosis not present

## 2019-02-04 DIAGNOSIS — Z8673 Personal history of transient ischemic attack (TIA), and cerebral infarction without residual deficits: Secondary | ICD-10-CM | POA: Diagnosis not present

## 2019-02-04 DIAGNOSIS — E1122 Type 2 diabetes mellitus with diabetic chronic kidney disease: Secondary | ICD-10-CM | POA: Insufficient documentation

## 2019-02-04 DIAGNOSIS — J449 Chronic obstructive pulmonary disease, unspecified: Secondary | ICD-10-CM | POA: Diagnosis not present

## 2019-02-04 DIAGNOSIS — Z170 Estrogen receptor positive status [ER+]: Secondary | ICD-10-CM | POA: Diagnosis not present

## 2019-02-04 DIAGNOSIS — Z801 Family history of malignant neoplasm of trachea, bronchus and lung: Secondary | ICD-10-CM | POA: Diagnosis not present

## 2019-02-04 DIAGNOSIS — Z86718 Personal history of other venous thrombosis and embolism: Secondary | ICD-10-CM | POA: Insufficient documentation

## 2019-02-04 DIAGNOSIS — Z86711 Personal history of pulmonary embolism: Secondary | ICD-10-CM | POA: Diagnosis not present

## 2019-02-04 DIAGNOSIS — I209 Angina pectoris, unspecified: Secondary | ICD-10-CM | POA: Diagnosis not present

## 2019-02-04 DIAGNOSIS — Z79811 Long term (current) use of aromatase inhibitors: Secondary | ICD-10-CM

## 2019-02-04 DIAGNOSIS — Z780 Asymptomatic menopausal state: Secondary | ICD-10-CM | POA: Diagnosis not present

## 2019-02-04 DIAGNOSIS — C50312 Malignant neoplasm of lower-inner quadrant of left female breast: Secondary | ICD-10-CM

## 2019-02-04 DIAGNOSIS — Z87891 Personal history of nicotine dependence: Secondary | ICD-10-CM | POA: Diagnosis not present

## 2019-02-04 DIAGNOSIS — Z800 Family history of malignant neoplasm of digestive organs: Secondary | ICD-10-CM | POA: Insufficient documentation

## 2019-02-04 DIAGNOSIS — I129 Hypertensive chronic kidney disease with stage 1 through stage 4 chronic kidney disease, or unspecified chronic kidney disease: Secondary | ICD-10-CM | POA: Diagnosis not present

## 2019-02-04 LAB — BASIC METABOLIC PANEL
Anion gap: 5 (ref 5–15)
BUN: 32 mg/dL — ABNORMAL HIGH (ref 8–23)
CO2: 26 mmol/L (ref 22–32)
Calcium: 8.5 mg/dL — ABNORMAL LOW (ref 8.9–10.3)
Chloride: 106 mmol/L (ref 98–111)
Creatinine, Ser: 1.79 mg/dL — ABNORMAL HIGH (ref 0.44–1.00)
GFR calc Af Amer: 32 mL/min — ABNORMAL LOW (ref >60)
GFR calc non Af Amer: 27 mL/min — ABNORMAL LOW (ref >60)
Glucose, Bld: 198 mg/dL — ABNORMAL HIGH (ref 70–99)
Potassium: 4.7 mmol/L (ref 3.5–5.1)
Sodium: 137 mmol/L (ref 135–145)

## 2019-02-04 NOTE — Progress Notes (Signed)
Tammy [name] [facility] CONSULT NOTE  Patient Care Team: [name], DO as PCP - General (Family Medicine) [name], [name], MD as PCP - Cardiology (Cardiology) [name], MD as Consulting Physician (Cardiology) [name], MD as Consulting Physician (Urology) [name], MD as Consulting Physician (Nephrology) [name], MD as Referring Physician (Orthopedic Surgery) [name], MD as Attending Physician (Endocrinology) [name], MD as Referring Physician (Vascular Surgery) De Hollingshead, [facility] as Pharmacist (Pharmacist)  CHIEF COMPLAINTS/PURPOSE OF CONSULTATION: Breast cancer  #  Oncology History Overview Note   # July 2020 Left Breast- IMC; 14 mm; Grade: 2; Lymphovascular Invasion: Not identified;  pT1c pN0(sn); ER positive, PR positive, HER2 negative. [s/p lumpec; Dr.Byrnett]; rs/p RT [finished Sep, 9th 2020]  # Sep end 2020- START Arimidexa  # CKD-III/COPD/ CHF-compesated/PAD; CAD  DIAGNOSIS: Left breast cancer  STAGE: 1        ;GOALS: Cure  CURRENT/MOST RECENT THERAPY: arimidex   Malignant neoplasm of lower-inner quadrant of left female breast ([place]) (Resolved)  08/14/2018 Initial Diagnosis   Malignant neoplasm of lower-inner quadrant of left female breast [facility])   Carcinoma of lower-inner quadrant of left breast in female, estrogen receptor positive ([place])  10/02/2018 Initial Diagnosis   Carcinoma of lower-inner quadrant of left breast in female, estrogen receptor positive ([place])      HISTORY OF PRESENTING ILLNESS:  Tammy [name] 73 y.o.  female to history of stage I ER PR positive/HER-2 negative breast cancer is in for follow-up.  In the interim patient underwent a EGD colonoscopy.   Patient denies any worsening hot flashes.  Denies any worsening joint pains.  Chronic knee pain.  Patient had steroid injections into her knee-which made her blood sugars go high.   Otherwise no new lumps or bumps.  No significant hot  flashes.  Review of Systems  Constitutional: Negative for chills, diaphoresis, fever, malaise/fatigue and weight loss.  HENT: Negative for nosebleeds and sore throat.   Eyes: Negative for double vision.  Respiratory: Negative for cough, hemoptysis, sputum production, shortness of breath and wheezing.   Cardiovascular: Negative for chest pain, palpitations, orthopnea and leg swelling.  Gastrointestinal: Negative for abdominal pain, blood in stool, constipation, diarrhea, heartburn, melena, nausea and vomiting.  Genitourinary: Negative for dysuria, frequency and urgency.  Musculoskeletal: Positive for back pain and joint pain.  Skin: Negative.  Negative for itching and rash.  Neurological: Negative for dizziness, tingling, focal weakness, weakness and headaches.  Endo/Heme/Allergies: Does not bruise/bleed easily.  Psychiatric/Behavioral: Negative for depression. The patient is not nervous/anxious and does not have insomnia.      MEDICAL HISTORY:  Past Medical History:  Diagnosis Date  . Asthma   . Benign neoplasm of colon   . Cervical disc disease    "bulging" - no limitations per pt  . Chronic diastolic CHF (congestive heart failure) ([place])    a. 07/2012 Echo: Nl EF. mild inferoseptal HK, Gr 2 DD, mild conc LVH, mild PR/TR, mild PAH.  . CKD (chronic kidney disease), stage III   . COPD (chronic obstructive pulmonary disease) ([place])   . Coronary artery disease    a. 11/2011 Cath: LAD 82md, LCX min irregs, RCA 70p, 1062mith L->R collats, EF 60%-->Med Rx.  . Diabetes mellitus without complication ([place]  . Fusion of lumbar spine 12/22/2015  . GERD (gastroesophageal reflux disease)   . History of DVT (deep vein thrombosis)   . History of tobacco abuse    a. Quit  2011.  . Hyperlipidemia   . Hypertension   . Hypothyroidism   . Obesity   . Palpitations   . PE (pulmonary embolism) 10/15  . PONV (postoperative nausea and vomiting)   . Pulmonary embolus ([place]) 12/25/2013   RLL. Presented  with SOB and elevated d-dimer.   [name] PVD (peripheral vascular disease) ([place])    a. PTA of right leg and left femoral artery with stenosis.  . Renal insufficiency   . Stroke [facility]) 2015   TIAs - no deficits    SURGICAL HISTORY: Past Surgical History:  Procedure Laterality Date  . ANGIOPLASTY / STENTING FEMORAL     PVD; angioplasty right leg and left femoral artery with stenosis.   . APPENDECTOMY  Oct. 2016  . BACK SURGERY  03/2012  . BACK SURGERY     screws, rods replaced with new hardware.  [name] BACK SURGERY  11/2016  . BREAST SURGERY    . CARDIAC CATHETERIZATION  12/07/2011   Mid LAD 50%, distal LAD 50%, mid RCA 70%, distal RCA 100%   . CARDIAC CATHETERIZATION  01/04/2011   100% occluded mid RCA with good collaterals from distal LAD, normal LVEF.  [name] CHOLECYSTECTOMY    . COLONOSCOPY  2013  . COLONOSCOPY WITH PROPOFOL N/A 05/24/2015   Procedure: COLONOSCOPY WITH PROPOFOL;  Surgeon: [name], MD;  Location: [place];  Service: Endoscopy;  Laterality: N/A;  Diabetic - oral meds  . COLONOSCOPY WITH PROPOFOL N/A 01/28/2019   Procedure: COLONOSCOPY WITH PROPOFOL;  Surgeon: [name], MD;  Location: [facility] END[facility]COPY;  Service: Endoscopy;  Laterality: N/A;  . DIAGNOSTIC LAPAROSCOPY    . ESOPHAGOGASTRODUODENOSCOPY (EGD) WITH PROPOFOL N/A 01/28/2019   Procedure: ESOPHAGOGASTRODUODENOSCOPY (EGD) WITH PROPOFOL;  Surgeon: [name], MD;  Location: [facility] ENDOSCOPY;  Service: Endoscopy;  Laterality: N/A;  . LAPAROSCOPIC APPENDECTOMY N/A 01/06/2015   Procedure: APPENDECTOMY LAPAROSCOPIC;  Surgeon: [name], MD;  Location: [facility] ORS;  Service: General;  Laterality: N/A;  . MASTECTOMY    . MASTECTOMY W/ SENTINEL NODE BIOPSY Left 08/26/2018   Procedure: PARTIAL MASTECTOMY WIDE EXCISION WITH SENTINEL LYMPH NODE BIOPSY LEFT;  Surgeon: [name], MD;  Location: [facility] OR[facility];  Service: General;  Laterality: Left;  . POLYPECTOMY  05/24/2015   Procedure:  POLYPECTOMY;  Surgeon: [name], MD;  Location: [place];  Service: Endoscopy;;  . THYROIDECTOMY    . TRIGGER FINGER RELEASE      SOCIAL HISTORY: Social History   Socioeconomic History  . Marital status: Married    Spouse name: Not on file  . Number of children: 2  . Years of education: some college, Production assistant, radio   . Highest education level: Associate degree: occupational, Hotel manager, or vocational program  Occupational History  . Occupation: Retired  Scientific laboratory technician  . Financial resource strain: Not hard at all  . Food insecurity    Worry: Never true    Inability: Never true  . Transportation needs    Medical: No    Non-medical: No  Tobacco Use  . Smoking status: Former Smoker    Packs/day: 0.50    Years: 15.00    Pack years: 7.50    Types: Cigarettes    Quit date: 03/14/2007    Years since quitting: 11.9  . Smokeless tobacco: Never Used  Substance and Sexual Activity  . Alcohol use: No    Alcohol/week: 0.0 standard drinks  . Drug use: Never  . Sexual activity: Yes    Birth  control/protection: Post-menopausal  Lifestyle  . Physical activity    Days per week: 0 days    Minutes per session: 0 min  . Stress: Not at all  Relationships  . Social connections    Talks on phone: More than three times a week    Gets together: More than three times a week    Attends religious service: 1 to 4 times per year    Active member of club or organization: No    Attends meetings of clubs or organizations: Never    Relationship status: Married  . Intimate partner violence    Fear of current or ex partner: No    Emotionally abused: No    Physically abused: No    Forced sexual activity: No  Other Topics Concern  . Not on file  Social History Narrative   Lives at home with husband in [place]. Quit smoking 10 years ago; never alcohol. 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     FAMILY HISTORY: Family  History  Problem Relation Age of Onset  . Hypertension Father   . Heart disease Father   . Heart attack Father   . Diabetes Father   . Cancer Mother        colon  . Heart attack Brother   . Diabetes Brother   . Hypertension Brother   . Heart disease Brother   . Heart attack Brother   . Diabetes Brother   . Hypertension Brother   . Heart disease Brother   . Heart attack Brother   . Diabetes Brother   . Hypertension Brother   . Heart disease Brother   . Cancer Sister        lung  . COPD Sister   . COPD Sister   . Breast cancer Neg Hx     ALLERGIES:  is allergic to hydrocodone; aleve [naproxen sodium]; contrast media [iodinated diagnostic agents]; ioxaglate; oxycodone; ranexa [ranolazine]; sulfa antibiotics; and tramadol.  MEDICATIONS:  Current Outpatient Medications  Medication Sig Dispense Refill  . anastrozole (ARIMIDEX) 1 MG tablet Take 1 tablet (1 mg total) by mouth daily. 30 tablet 4  . apixaban (ELIQUIS) 2.5 MG TABS tablet Take 1 tablet (2.5 mg) by mouth twice daily 180 tablet 3  . bisacodyl (BISACODYL) 5 MG EC tablet Please take 2 tablets between 1pm and 3pm the day before procedure 2 tablet 0  . blood glucose meter kit and supplies KIT Dispense based on patient and insurance preference. Use one time daily as directed, E11.9 1 each 0  . Calcium Carb-Cholecalciferol (CALCIUM 600 + D) 600-200 MG-UNIT TABS Take by mouth daily.    . cholecalciferol (VITAMIN D3) 25 MCG (1000 UT) tablet Take 1,000 Units by mouth daily.    . Dulaglutide (TRULICITY) 1.5 MH/9.6QI SOPN Inject 1.5 mg into the skin every Tuesday. 4 pen 6  . DULoxetine (CYMBALTA) 60 MG capsule TAKE 1 CAPSULE EVERY DAY 90 capsule 1  . empagliflozin (JARDIANCE) 25 MG TABS tablet Take 25 mg by mouth daily before breakfast. 30 tablet 3  . gabapentin (NEURONTIN) 300 MG capsule TAKE 1 CAPSULE BY MOUTH THREE TIMES DAILY 270 capsule 3  . glipiZIDE (GLUCOTROL) 5 MG tablet TAKE ONE TABLET EVERY DAY BEFORE BREAKFAST 90 tablet  1  . isosorbide mononitrate (IMDUR) 60 MG 24 hr tablet TAKE 1 TABLET BY MOUTH AT BEDTIME AND 1/2 TABLET IN THE MORNING 135 tablet 3  . levothyroxine (SYNTHROID) 125 MCG tablet  Take 1 tablet (125 mcg total) by mouth daily before breakfast. 30 tablet 2  . losartan (COZAAR) 25 MG tablet Take 1 tablet (25 mg total) by mouth daily. 90 tablet 1  . metoprolol succinate (TOPROL-XL) 50 MG 24 hr tablet TAKE 1 TABLET BY MOUTH DAILY WITH OR IMMEDIATLEY FOLLOWING A MEAL 90 tablet 1  . Multiple Vitamins-Minerals (ZINC PO) Take by mouth daily.    . nitroGLYCERIN (NITROSTAT) 0.4 MG SL tablet Place 1 tablet (0.4 mg total) under the tongue every 5 (five) minutes as needed for chest pain. 25 tablet 6  . pantoprazole (PROTONIX) 40 MG tablet TAKE 1 TABLET BY MOUTH DAILY AS NEEDED 90 tablet 1  . pregabalin (LYRICA) 150 MG capsule Take 1 capsule (150 mg total) by mouth 2 (two) times daily. 60 capsule 3  . PROAIR HFA 108 (90 Base) MCG/ACT inhaler TAKE 2 PUFFS EVERY 4 HOURS AS NEEDED FORWHEEZING OR SHORTNESS OF BREATH 8.5 g 3  . psyllium (METAMUCIL) 58.6 % packet Take 1 packet by mouth daily as needed (fiber).     . rosuvastatin (CRESTOR) 40 MG tablet TAKE 1 TABLET BY MOUTH DAILY 90 tablet 1  . trimethoprim (TRIMPEX) 100 MG tablet TAKE 1 TABLET BY MOUTH DAILY 90 tablet 0  . VITAMIN D PO Take by mouth daily. Vitamin D3 1000 mg    . Benzonatate (TESSALON PERLES PO) Take 1 capsule by mouth as needed (cough).     No current facility-administered medications for this visit.       [name]  PHYSICAL EXAMINATION: ECOG PERFORMANCE STATUS: 1 - Symptomatic but completely ambulatory  Vitals:   02/04/19 1027  BP: (!) 142/100  Pulse: 72  Resp: (!) 22  Temp: 98.8 F (37.1 C)   Filed Weights   02/04/19 1027  Weight: 214 lb (97.1 kg)    Physical Exam  Constitutional: She is oriented to person, place, and time and well-developed, well-nourished, and in no distress.  HENT:  Head: Normocephalic and atraumatic.  Mouth/Throat:  Oropharynx is clear and moist. No oropharyngeal exudate.  Eyes: Pupils are equal, round, and reactive to light.  Neck: Normal range of motion. Neck supple.  Cardiovascular: Normal rate and regular rhythm.  Pulmonary/Chest: No respiratory distress. She has no wheezes.  Abdominal: Soft. Bowel sounds are normal. She exhibits no distension and no mass. There is no abdominal tenderness. There is no rebound and no guarding.  Musculoskeletal: Normal range of motion.        General: No tenderness or edema.  Neurological: She is alert and oriented to person, place, and time.  Skin: Skin is warm.  Psychiatric: Affect normal.   LABORATORY DATA:  I have reviewed the data as listed Lab Results  Component Value Date   WBC 8.1 12/04/2018   HGB 13.4 12/04/2018   HCT 43.3 12/04/2018   MCV 90.8 12/04/2018   PLT 228 12/04/2018   Recent Labs    05/21/18 1355  12/04/18 1020 01/02/19 1034 02/04/19 0959  NA 138   < > 135 137 137  K 6.0*   < > 4.8 5.0 4.7  CL 100   < > 100 101 106  CO2 22   < > 25 24 26   GLUCOSE 138*   < > 250* 266* 198*  BUN 28*   < > 26* 22 32*  CREATININE 1.59*   < > 1.55* 1.57* 1.79*  CALCIUM 9.3   < > 8.0* 8.6* 8.5*  GFRNONAA 32*   < > 32* 32* 27*  GFRAA 37*   < > 38* 37* 32*  PROT 7.2  --  7.0 6.4  --   ALBUMIN 4.4  --  3.5 4.0  --   AST 17  --  16 15  --   ALT 11  --  13 11  --   ALKPHOS 75  --  75 98  --   BILITOT 0.5  --  0.8 0.6  --    < > = values in this interval not displayed.    RADIOGRAPHIC STUDIES: I have personally reviewed the radiological images as listed and agreed with the findings in the report. No results found.  ASSESSMENT & PLAN:   Carcinoma of lower-inner quadrant of left breast in female, estrogen receptor positive ([place]) #Left breast stage I breast cancer ER PR positive HER-2 negative; status post lumpectomy.  MammaPrint low risk.  Status post radiation. On Anastraszole-tolerating well.  Stable.  # Clinically no evidence of disease  recurrence.  Continue anastrozole for total of 5 years.  # Hypocalcemia-mild/CKD calcium 8.5.  Continue calcium plus vitamin D.  Discussed risk factors for osteoporosis; recommend bone density test prior to next visit.  Discussed the treatment options including but not limited to-bisphosphonates/exercise etc.  # CKD- III- creat 1.7.[recent elevated FBGs- sec to steroidal injections]; Dr.Kolluru/follow-up with PCP.  # DISPOSITION: #Follow-up in 4 months; MD-VIDEO-; BMD prior-Dr. B    All questions were answered. The patient/family knows to call the clinic with any problems, questions or concerns.       Cammie Sickle, MD 02/04/2019 12:33 PM

## 2019-02-04 NOTE — Assessment & Plan Note (Addendum)
#  Left breast stage I breast cancer ER PR positive HER-2 negative; status post lumpectomy.  MammaPrint low risk.  Status post radiation. On Anastraszole-tolerating well.  Stable.  # Clinically no evidence of disease recurrence.  Continue anastrozole for total of 5 years.  # Hypocalcemia-mild/CKD calcium 8.5.  Continue calcium plus vitamin D.  Discussed risk factors for osteoporosis; recommend bone density test prior to next visit.  Discussed the treatment options including but not limited to-bisphosphonates/exercise etc.  # CKD- III- creat 1.7.[recent elevated FBGs- sec to steroidal injections]; Dr.Kolluru/follow-up with PCP.  # DISPOSITION: #Follow-up in 4 months; MD-VIDEO-; BMD prior-Dr. B

## 2019-02-05 ENCOUNTER — Telehealth: Payer: Self-pay

## 2019-02-05 ENCOUNTER — Other Ambulatory Visit: Payer: Self-pay | Admitting: Gastroenterology

## 2019-02-05 MED ORDER — FLUCONAZOLE 200 MG PO TABS: ORAL_TABLET | ORAL | 0 refills | Status: AC

## 2019-02-05 NOTE — Telephone Encounter (Signed)
Patient verbalized understanding and will pick up medication at pharmacy

## 2019-02-05 NOTE — Telephone Encounter (Signed)
Called and left message for call back.

## 2019-02-05 NOTE — Telephone Encounter (Signed)
-----   Message from [name], MD sent at 02/05/2019  9:57 AM EST ----- Tammy [name] please let the patient know, her esophageal sample showed yeast.  I have sent an antifungal over to her pharmacy.  This is what is causing her difficulty swallowing.  Repeat colonoscopy in 1 year with 2-day prep.  See me in clinic as scheduled

## 2019-02-07 ENCOUNTER — Other Ambulatory Visit: Payer: Self-pay | Admitting: Urology

## 2019-02-13 ENCOUNTER — Encounter: Payer: Self-pay | Admitting: Family Medicine

## 2019-02-13 ENCOUNTER — Other Ambulatory Visit: Payer: Self-pay

## 2019-02-13 ENCOUNTER — Ambulatory Visit (INDEPENDENT_AMBULATORY_CARE_PROVIDER_SITE_OTHER): Payer: Medicare Other | Admitting: Family Medicine

## 2019-02-13 DIAGNOSIS — IMO0002 Reserved for concepts with insufficient information to code with codable children: Secondary | ICD-10-CM

## 2019-02-13 DIAGNOSIS — E118 Type 2 diabetes mellitus with unspecified complications: Secondary | ICD-10-CM

## 2019-02-13 DIAGNOSIS — I209 Angina pectoris, unspecified: Secondary | ICD-10-CM | POA: Diagnosis not present

## 2019-02-13 DIAGNOSIS — E1165 Type 2 diabetes mellitus with hyperglycemia: Secondary | ICD-10-CM

## 2019-02-13 NOTE — Progress Notes (Signed)
There were no vitals taken for this visit.   Subjective:    Patient ID: Tammy [name], female    DOB: Jun 06, 1945, 73 y.o.   MRN: [id number]  HPI: Tammy [name] is a 73 y.o. female  Chief Complaint  Patient presents with  . Diabetes   DIABETES- jardiance started last visit, saw ortho for knee shots, caused her sugars to go up really high Hypoglycemic episodes:no Polydipsia/polyuria: no Visual disturbance: no Chest pain: no Paresthesias: no Glucose Monitoring: yes  Accucheck frequency: Daily  Fasting glucose:120s Taking Insulin?: no Blood Pressure Monitoring: not checking Retinal Examination: Not up to Date Foot Exam: Up to Date Diabetic Education: Completed Pneumovax: Up to Date Influenza: Up to Date Aspirin: no  Relevant past medical, surgical, family and social history reviewed and updated as indicated. Interim medical history since our last visit reviewed. Allergies and medications reviewed and updated.  Review of Systems  Constitutional: Negative.   Respiratory: Negative.   Cardiovascular: Negative.   Musculoskeletal: Negative.   Psychiatric/Behavioral: Negative.     Per HPI unless specifically indicated above     Objective:    There were no vitals taken for this visit.  Wt Readings from Last 3 Encounters:  02/04/19 214 lb (97.1 kg)  01/28/19 207 lb (93.9 kg)  01/06/19 216 lb 8 oz (98.2 kg)    Physical Exam Vitals signs and nursing note reviewed.  Constitutional:      General: She is not in acute distress.    Appearance: Normal appearance. She is not ill-appearing, toxic-appearing or diaphoretic.  HENT:     Head: Normocephalic and atraumatic.     Right Ear: External ear normal.     Left Ear: External ear normal.     Nose: Nose normal.     Mouth/Throat:     Mouth: Mucous membranes are moist.     Pharynx: Oropharynx is clear.  Eyes:     General: No scleral icterus.       Right eye: No discharge.        Left eye: No discharge.     Conjunctiva/sclera:  Conjunctivae normal.     Pupils: Pupils are equal, round, and reactive to light.  Neck:     Musculoskeletal: Normal range of motion.  Pulmonary:     Effort: Pulmonary effort is normal. No respiratory distress.     Comments: Speaking in full sentences Musculoskeletal: Normal range of motion.  Skin:    Coloration: Skin is not jaundiced or pale.     Findings: No bruising, erythema, lesion or rash.  Neurological:     Mental Status: She is alert and oriented to person, place, and time. Mental status is at baseline.  Psychiatric:        Mood and Affect: Mood normal.        Behavior: Behavior normal.        Thought Content: Thought content normal.        Judgment: Judgment normal.     Results for orders placed or performed in visit on 60/10/93  Basic metabolic panel  Result Value Ref Range   Sodium 137 135 - 145 mmol/L   Potassium 4.7 3.5 - 5.1 mmol/L   Chloride 106 98 - 111 mmol/L   CO2 26 22 - 32 mmol/L   Glucose, Bld 198 (H) 70 - 99 mg/dL   BUN 32 (H) 8 - 23 mg/dL   Creatinine, Ser 1.79 (H) 0.44 - 1.00 mg/dL   Calcium 8.5 (L) 8.9 - 10.3  mg/dL   GFR calc non Af Amer 27 (L) >60 mL/min   GFR calc Af Amer 32 (L) >60 mL/min   Anion gap 5 5 - 15      Assessment & Plan:   Problem List Items Addressed This Visit      Endocrine   Uncontrolled type 2 diabetes mellitus with complication, without long-term current use of insulin ([place])    Tolerating her jardiance well. Continue to monitor. Call with any concerns. Recheck A1c 6 weeks.           Follow up plan: Return in about 6 weeks (around 03/27/2019) for DM follow up.   . This visit was completed via FaceTime due to the restrictions of the COVID-19 pandemic. All issues as above were discussed and addressed. Physical exam was done as above through visual confirmation on FaceTime. If it was felt that the patient should be evaluated in the office, they were directed there. The patient verbally consented to this visit. . Location of  the patient: home . Location of the provider: work . Those involved with this call:  . Provider: Park Liter, DO . CMA: Tiffany Reel, CMA . Front Desk/Registration: Don Perking  . Time spent on call: 15 minutes with patient face to face via video conference. More than 50% of this time was spent in counseling and coordination of care. 23 minutes total spent in review of patient's record and preparation of their chart.

## 2019-02-13 NOTE — Assessment & Plan Note (Signed)
Tolerating her jardiance well. Continue to monitor. Call with any concerns. Recheck A1c 6 weeks.

## 2019-02-14 ENCOUNTER — Other Ambulatory Visit: Payer: Self-pay | Admitting: Family Medicine

## 2019-02-14 ENCOUNTER — Ambulatory Visit
Admission: RE | Admit: 2019-02-14 | Discharge: 2019-02-14 | Disposition: A | Discharge status: Home / Self Care | Payer: Medicare Other | Source: Ambulatory Visit | Attending: Student | Admitting: Student

## 2019-02-14 DIAGNOSIS — M545 Low back pain, unspecified: Secondary | ICD-10-CM

## 2019-02-14 DIAGNOSIS — M4326 Fusion of spine, lumbar region: Secondary | ICD-10-CM | POA: Diagnosis not present

## 2019-02-14 DIAGNOSIS — G8929 Other chronic pain: Secondary | ICD-10-CM

## 2019-02-14 NOTE — Telephone Encounter (Signed)
Forwarding medication refill requests to PCP for review. 

## 2019-03-05 ENCOUNTER — Other Ambulatory Visit: Payer: Self-pay

## 2019-03-05 ENCOUNTER — Encounter: Payer: Self-pay | Admitting: Gastroenterology

## 2019-03-05 ENCOUNTER — Ambulatory Visit (INDEPENDENT_AMBULATORY_CARE_PROVIDER_SITE_OTHER): Payer: Medicare Other | Admitting: Gastroenterology

## 2019-03-05 VITALS — BP 118/73 | HR 82 | Temp 97.9°F | Wt 213.2 lb

## 2019-03-05 DIAGNOSIS — R1310 Dysphagia, unspecified: Secondary | ICD-10-CM

## 2019-03-05 DIAGNOSIS — K5900 Constipation, unspecified: Secondary | ICD-10-CM

## 2019-03-05 MED ORDER — POLYETHYLENE GLYCOL 3350 17 G PO PACK: 17.0000 g | PACK | Freq: Every day | ORAL | 1 refills | Status: DC

## 2019-03-05 NOTE — Progress Notes (Signed)
Tammy Antigua, MD [address]  [place]  [place], [place] [address]  Main: [phone]  Fax: [phone]   Primary Care Physician: [name], DO   Chief Complaint  Patient presents with  . Constipation    HPI: Tammy [name] is a 73 y.o. female here for follow-up of constipation.  Taking Metamucil daily but still going 3 to 4 days without a bowel movement.  Reports some right lower quadrant pain due to this intermittently.  Dull, nonradiating, 3/10.  No blood in stool.  No nausea  Reports dysphagia, but to cold water only.  Warm liquids have no problem neither do solid foods.  No weight loss.  Had yeast in her esophageal brushings and this was treated but this did not change her dysphagia.  History of colon cancer in mother and brother.  EGD November 2020 White lesions in the esophagus, gastric erythema, thickened fold in the antrum.  Multiple gastric polyps.  Pathology negative for H. pylori.  Yeast seen on esophageal brushings and treated.  Fundic gland polyps noted.  No EOE  Colonoscopy November 2020 Fair prep, 8 subcentimeter polyps removed, diverticulosis reported.  Polyps showed tubular adenoma  Previous history: She also had an upper GI study done as on a previous visit symptoms she had reported some intermittent nausea. She states this has resolved. Upper GI study showed small hiatal hernia, reflux and esophageal spasm. She reports her reflux is well controlled with omeprazole. Without the omeprazole she has significant reflux symptoms.  Had a colonoscopy in March 2017 due to chronic diarrhea as well.6 subcentimeter polyps were removed. Terminal ileum was examined and appeared normal. Diverticulosis and internal hemorrhoids were reported. I do not have the pathology report available, but letter sent to patient under letters tab shows repeat was recommended in 5 years due to precancerous polyps.  Patient also had a positive fecal occult blood  test in March 2020  Current Outpatient Medications  Medication Sig Dispense Refill  . anastrozole (ARIMIDEX) 1 MG tablet Take 1 tablet (1 mg total) by mouth daily. 30 tablet 4  . apixaban (ELIQUIS) 2.5 MG TABS tablet Take 1 tablet (2.5 mg) by mouth twice daily 180 tablet 3  . blood glucose meter kit and supplies KIT Dispense based on patient and insurance preference. Use one time daily as directed, E11.9 1 each 0  . Calcium Carb-Cholecalciferol (CALCIUM 600 + D) 600-200 MG-UNIT TABS Take by mouth daily.    . cholecalciferol (VITAMIN D3) 25 MCG (1000 UT) tablet Take 1,000 Units by mouth daily.    . Dulaglutide (TRULICITY) 1.5 HQ/4.6NG SOPN Inject 1.5 mg into the skin every Tuesday. 4 pen 6  . DULoxetine (CYMBALTA) 60 MG capsule TAKE 1 CAPSULE EVERY DAY 90 capsule 1  . empagliflozin (JARDIANCE) 25 MG TABS tablet Take 25 mg by mouth daily before breakfast. 30 tablet 3  . gabapentin (NEURONTIN) 300 MG capsule TAKE 1 CAPSULE BY MOUTH THREE TIMES DAILY 270 capsule 3  . glipiZIDE (GLUCOTROL) 5 MG tablet TAKE ONE TABLET EVERY DAY BEFORE BREAKFAST 90 tablet 1  . isosorbide mononitrate (IMDUR) 60 MG 24 hr tablet TAKE 1 TABLET BY MOUTH AT BEDTIME AND 1/2 TABLET IN THE MORNING 135 tablet 3  . levothyroxine (SYNTHROID) 125 MCG tablet Take 1 tablet (125 mcg total) by mouth daily before breakfast. 30 tablet 2  . losartan (COZAAR) 25 MG tablet Take 1 tablet (25 mg total) by mouth daily. 90 tablet 1  . metoprolol succinate (TOPROL-XL) 50  MG 24 hr tablet TAKE 1 TABLET BY MOUTH DAILY WITH OR IMMEDIATLEY FOLLOWING A MEAL 90 tablet 1  . Multiple Vitamins-Minerals (ZINC PO) Take by mouth daily.    . nitroGLYCERIN (NITROSTAT) 0.4 MG SL tablet Place 1 tablet (0.4 mg total) under the tongue every 5 (five) minutes as needed for chest pain. 25 tablet 6  . pantoprazole (PROTONIX) 40 MG tablet TAKE 1 TABLET BY MOUTH DAILY AS NEEDED 90 tablet 1  . pregabalin (LYRICA) 150 MG capsule Take 1 capsule (150 mg total) by mouth 2  (two) times daily. 60 capsule 3  . PROAIR HFA 108 (90 Base) MCG/ACT inhaler TAKE 2 PUFFS EVERY 4 HOURS AS NEEDED FORWHEEZING OR SHORTNESS OF BREATH 8.5 g 3  . psyllium (METAMUCIL) 58.6 % packet Take 1 packet by mouth daily as needed (fiber).     . rosuvastatin (CRESTOR) 40 MG tablet TAKE 1 TABLET BY MOUTH DAILY 90 tablet 1  . trimethoprim (TRIMPEX) 100 MG tablet TAKE 1 TABLET BY MOUTH DAILY 90 tablet 0  . VITAMIN D PO Take by mouth daily. Vitamin D3 1000 mg    . polyethylene glycol (MIRALAX MIX-IN PAX) 17 g packet Take 17 g by mouth daily. 30 each 1   No current facility-administered medications for this visit.    Allergies as of 03/05/2019 - Review Complete 03/05/2019  Allergen Reaction Noted  . Hydrocodone Other (See Comments) 12/29/2016  . Aleve [naproxen sodium]  06/13/2013  . Contrast media [iodinated diagnostic agents]  05/19/2015  . Ioxaglate Other (See Comments) 10/01/2015  . Oxycodone Other (See Comments) 01/15/2014  . Ranexa [ranolazine]  06/13/2013  . Sulfa antibiotics Other (See Comments) 05/19/2015  . Tramadol Other (See Comments) 01/15/2014    ROS:  General: Negative for anorexia, weight loss, fever, chills, fatigue, weakness. ENT: Negative for hoarseness, difficulty swallowing , nasal congestion. CV: Negative for chest pain, angina, palpitations, dyspnea on exertion, peripheral edema.  Respiratory: Negative for dyspnea at rest, dyspnea on exertion, cough, sputum, wheezing.  GI: See history of present illness. GU:  Negative for dysuria, hematuria, urinary incontinence, urinary frequency, nocturnal urination.  Endo: Negative for unusual weight change.    Physical Examination:   BP 118/73 (BP Location: Left Arm, Patient Position: Sitting, Cuff Size: Normal)   Pulse 82   Temp 97.9 F (36.6 C) (Oral)   Wt 213 lb 4 oz (96.7 kg)   BMI 34.42 kg/m   General: Well-nourished, well-developed in no acute distress.  Eyes: No icterus. Conjunctivae pink. Mouth:  Oropharyngeal mucosa moist and pink , no lesions erythema or exudate. Neck: Supple, Trachea midline Abdomen: Bowel sounds are normal, nontender, nondistended, no hepatosplenomegaly or masses, no abdominal bruits or hernia , no rebound or guarding.   Extremities: No lower extremity edema. No clubbing or deformities. Neuro: Alert and oriented x 3.  Grossly intact. Skin: Warm and dry, no jaundice.   Psych: Alert and cooperative, normal mood and affect.   Labs: CMP     Component Value Date/Time   NA 137 02/04/2019 0959   NA 137 01/02/2019 1034   NA 140 09/20/2011 1134   K 4.7 02/04/2019 0959   K 4.4 09/20/2011 1134   CL 106 02/04/2019 0959   CL 103 09/20/2011 1134   CO2 26 02/04/2019 0959   CO2 28 09/20/2011 1134   GLUCOSE 198 (H) 02/04/2019 0959   GLUCOSE 139 (H) 09/20/2011 1134   BUN 32 (H) 02/04/2019 0959   BUN 22 01/02/2019 1034   BUN 17 09/20/2011 1134  CREATININE 1.79 (H) 02/04/2019 0959   CREATININE 1.31 (H) 09/20/2011 1134   CALCIUM 8.5 (L) 02/04/2019 0959   CALCIUM 8.4 (L) 09/20/2011 1134   PROT 6.4 01/02/2019 1034   ALBUMIN 4.0 01/02/2019 1034   AST 15 01/02/2019 1034   ALT 11 01/02/2019 1034   ALKPHOS 98 01/02/2019 1034   BILITOT 0.6 01/02/2019 1034   GFRNONAA 27 (L) 02/04/2019 0959   GFRNONAA 42 (L) 09/20/2011 1134   GFRAA 32 (L) 02/04/2019 0959   GFRAA 48 (L) 09/20/2011 1134   Lab Results  Component Value Date   WBC 8.1 12/04/2018   HGB 13.4 12/04/2018   HCT 43.3 12/04/2018   MCV 90.8 12/04/2018   PLT 228 12/04/2018    Imaging Studies: CT LUMBAR SPINE WO CONTRAST  Result Date: 02/14/2019 CLINICAL DATA:  Low back pain after standing for extended periods of time EXAM: CT LUMBAR SPINE WITHOUT CONTRAST TECHNIQUE: Multidetector CT imaging of the lumbar spine was performed without intravenous contrast administration. Multiplanar CT image reconstructions were also generated. COMPARISON:  MRI 04/17/2017, CT 04/08/2015, report 01/31/2019 FINDINGS: Segmentation:  5 lumbar type vertebrae. Alignment: Trace anterolisthesis L4 on L5 without significant change. Alignment is otherwise within normal limits. Vertebrae: Minimal loss of height anteriorly of L4, appears chronic to 2019 MRI. No acute fracture is visualized. Paraspinal and other soft tissues: Proximal suprarenal abdominal aortic aneurysm measuring up to 3.5 cm on coronal views. Chronic hydronephrosis right kidney. Mild hydronephrosis left kidney. Sigmoid colon diverticular disease. Mild fatty atrophy of the paraspinous muscles. Incompletely visualized spinal stimulator leads at T11. Disc levels: At T12-L1, maintained disc space. No canal stenosis. Foramen are patent bilaterally. At L1-L2, maintained disc space. No significant canal stenosis. Foramen are patent bilaterally. Mild facet degenerative changes. At L2-L3, interbody device with posterior fusion rod. Pedicle screws appear in place. No lucency. Some flattening of the thecal sac without high-grade canal stenosis. Facet degenerative changes. Foramen appear patent bilaterally. At L3-L4, interbody device with posterior fusion rods and pedicle screws. Screws remain in place. No lucency. No high-grade canal stenosis. Some flattening of the anterior thecal sac. Facet degenerative changes. Foramen appear grossly patent. At L4-L5, interbody device. No bridging spinal rod. Pedicle screws remain and pays. No surrounding lucency. No high-grade canal stenosis. Mild foraminal narrowing bilaterally. Facet degenerative changes. At L5-S1, interbody device. Posterior rod and fixating screws without lucency. No high-grade canal stenosis. The foramen appear mildly narrowed. IMPRESSION: 1. Prior lumbar fusion with posterior spinal rods and transpedicular screws L2 through L4 and L5 through S1 with non contiguous appearance of the posterior spinal rods at L4-L5. Hardware appears grossly intact, no definitive lucency to suggest loosening. 2. Stable lumbar alignment with trace  anterolisthesis L4 on L5. No high-grade canal stenosis or foraminal narrowing by CT. 3. Suprarenal abdominal aortic aneurysm up to 3.5 cm. Recommend followup by ultrasound in 2 years. This recommendation follows ACR consensus guidelines: White Paper of the ACR Incidental Findings Committee II on Vascular Findings. J Am Coll Radiol 2013; 10:789-794. Aortic aneurysm NOS (ICD10-I71.9) 4. Chronic right hydronephrosis. 5. Sigmoid colon diverticular disease without acute inflammatory change Electronically Signed   By: Donavan Foil M.D.   On: 02/14/2019 15:41    Assessment and Plan:   Tammy [name] is a 73 y.o. y/o female here for follow-up of constipation  Stop Metamucil and  start MiraLAX daily  If no improvement in 2 weeks patient advised to call us Proper use and titration discussed  We discussed that her dysphagia to liquids  only, and only for cold liquids may be due to oropharyngeal dysphagia.  Swallow evaluation discussed but patient does not want this study at this time.  She states she will try swallowing techniques like a chin tuck method and if symptoms do not improve, she will consider it at that time and let us know at that time.  No alarm symptoms present.  EGD otherwise unrevealing for other causes of dysphagia, and it did not improve after treatment of yeast in the esophagus  Dr Tammy [name]

## 2019-03-25 ENCOUNTER — Ambulatory Visit (INDEPENDENT_AMBULATORY_CARE_PROVIDER_SITE_OTHER): Payer: Medicare Other | Admitting: Family Medicine

## 2019-03-25 ENCOUNTER — Other Ambulatory Visit: Payer: Self-pay | Admitting: Internal Medicine

## 2019-03-25 ENCOUNTER — Encounter: Payer: Self-pay | Admitting: Family Medicine

## 2019-03-25 ENCOUNTER — Other Ambulatory Visit: Payer: Self-pay

## 2019-03-25 ENCOUNTER — Other Ambulatory Visit: Payer: Self-pay | Admitting: Family Medicine

## 2019-03-25 DIAGNOSIS — J0100 Acute maxillary sinusitis, unspecified: Secondary | ICD-10-CM | POA: Diagnosis not present

## 2019-03-25 DIAGNOSIS — M533 Sacrococcygeal disorders, not elsewhere classified: Secondary | ICD-10-CM

## 2019-03-25 MED ORDER — AMOXICILLIN-POT CLAVULANATE 875-125 MG PO TABS: 1.0000 | ORAL_TABLET | Freq: Two times a day (BID) | ORAL | 0 refills | Status: DC

## 2019-03-25 MED ORDER — PREDNISONE 50 MG PO TABS: 50.0000 mg | ORAL_TABLET | Freq: Every day | ORAL | 0 refills | Status: DC

## 2019-03-25 NOTE — Patient Instructions (Signed)
To schedule a COVID test, please  text "COVID" to [phone], OR you can log on to [email or web address] to easily make an on-line appointment. If you do not have access to a smart phone or PC, you can call [phone] to get assistance.  

## 2019-03-25 NOTE — Progress Notes (Signed)
There were no vitals taken for this visit.   Subjective:    Patient ID: Tammy [name], female    DOB: 12/28/1945, 74 y.o.   MRN: [id number]  HPI: Tammy [name] is a 74 y.o. female  Chief Complaint  Patient presents with  . URI    nose bleeds in the mornings, teeth pain, jaw pain, right eye pain X 2 weeks   UPPER RESPIRATORY TRACT INFECTION Duration: 2 weeks Worst symptom: nose bleeds, congestion Fever: no Cough: no Shortness of breath: yes Wheezing: no Chest pain: no Chest tightness: no Chest congestion: no Nasal congestion: yes Runny nose: yes Post nasal drip: yes Sneezing: yes Sore throat: no Swollen glands: no Sinus pressure: yes Headache: no Face pain: yes Toothache: yes Ear pain: no  Ear pressure: no  Eyes red/itching:no Eye drainage/crusting: no  Vomiting: no Rash: no Fatigue: yes Sick contacts: no Strep contacts: no  Context: stable Recurrent sinusitis: no Relief with OTC cold/cough medications: no  Treatments attempted: none   Fell before Christmas and has been having some pain in her sacrum and coccyx. Has been getting better. Aching and sore. Worse with moving and sitting. Better with certain positions. No radiation. Otherwise feeling well.  Relevant past medical, surgical, family and social history reviewed and updated as indicated. Interim medical history since our last visit reviewed. Allergies and medications reviewed and updated.  Review of Systems  Constitutional: Positive for fatigue. Negative for activity change, appetite change, chills, diaphoresis, fever and unexpected weight change.  HENT: Positive for congestion, nosebleeds, postnasal drip, rhinorrhea, sinus pressure and sinus pain. Negative for dental problem, drooling, ear discharge, ear pain, facial swelling, hearing loss, mouth sores, sneezing, sore throat, tinnitus, trouble swallowing and voice change.   Eyes: Negative.   Respiratory: Positive for shortness of breath. Negative for  apnea, cough, choking, chest tightness, wheezing and stridor.   Cardiovascular: Negative.   Gastrointestinal: Negative.   Musculoskeletal: Positive for back pain and gait problem. Negative for arthralgias, joint swelling, myalgias, neck pain and neck stiffness.  Skin: Negative.   Psychiatric/Behavioral: Negative.     Per HPI unless specifically indicated above     Objective:    There were no vitals taken for this visit.  Wt Readings from Last 3 Encounters:  03/05/19 213 lb 4 oz (96.7 kg)  02/04/19 214 lb (97.1 kg)  01/28/19 207 lb (93.9 kg)    Physical Exam Vitals and nursing note reviewed.  Constitutional:      General: She is not in acute distress.    Appearance: Normal appearance. She is not ill-appearing, toxic-appearing or diaphoretic.  HENT:     Head: Normocephalic and atraumatic.     Right Ear: External ear normal.     Left Ear: External ear normal.     Nose: Nose normal.     Mouth/Throat:     Mouth: Mucous membranes are moist.     Pharynx: Oropharynx is clear.  Eyes:     General: No scleral icterus.       Right eye: No discharge.        Left eye: No discharge.     Conjunctiva/sclera: Conjunctivae normal.     Pupils: Pupils are equal, round, and reactive to light.  Pulmonary:     Effort: Pulmonary effort is normal. No respiratory distress.     Comments: Speaking in full sentences Musculoskeletal:        General: Normal range of motion.     Cervical back: Normal range of  motion.  Skin:    Coloration: Skin is not jaundiced or pale.     Findings: No bruising, erythema, lesion or rash.  Neurological:     Mental Status: She is alert and oriented to person, place, and time. Mental status is at baseline.  Psychiatric:        Mood and Affect: Mood normal.        Behavior: Behavior normal.        Thought Content: Thought content normal.        Judgment: Judgment normal.     Results for orders placed or performed in visit on 73/42/87  Basic metabolic panel    Result Value Ref Range   Sodium 137 135 - 145 mmol/L   Potassium 4.7 3.5 - 5.1 mmol/L   Chloride 106 98 - 111 mmol/L   CO2 26 22 - 32 mmol/L   Glucose, Bld 198 (H) 70 - 99 mg/dL   BUN 32 (H) 8 - 23 mg/dL   Creatinine, Ser 1.79 (H) 0.44 - 1.00 mg/dL   Calcium 8.5 (L) 8.9 - 10.3 mg/dL   GFR calc non Af Amer 27 (L) >60 mL/min   GFR calc Af Amer 32 (L) >60 mL/min   Anion gap 5 5 - 15      Assessment & Plan:   Problem List Items Addressed This Visit    None    Visit Diagnoses    Acute non-recurrent maxillary sinusitis    -  Primary   Will treat with prednisone and augmentin. Will also test for covid given overlap. Continue to monitor. Call with any concerns.    Relevant Medications   predniSONE (DELTASONE) 50 MG tablet   amoxicillin-clavulanate (AUGMENTIN) 875-125 MG tablet   Other Relevant Orders   Novel Coronavirus, NAA ([facility])   Coccyx pain       Will check x-ray. Await results.    Relevant Medications   predniSONE (DELTASONE) 50 MG tablet   Other Relevant Orders   DG Lumbar Spine Complete       Follow up plan: Return if symptoms worsen or fail to improve.   . This visit was completed via FaceTime due to the restrictions of the COVID-19 pandemic. All issues as above were discussed and addressed. Physical exam was done as above through visual confirmation on FaceTime. If it was felt that the patient should be evaluated in the office, they were directed there. The patient verbally consented to this visit. . Location of the patient: home . Location of the provider: work . Those involved with this call:  . Provider: Park Liter, DO . CMA: Tiffany Reel, CMA . Front Desk/Registration: Don Perking  . Time spent on call: 25 minutes with patient face to face via video conference. More than 50% of this time was spent in counseling and coordination of care. 40 minutes total spent in review of patient's record and preparation of their chart.

## 2019-03-31 ENCOUNTER — Telehealth: Payer: Self-pay | Admitting: Cardiovascular Disease

## 2019-03-31 NOTE — Telephone Encounter (Signed)
° °  Pt c/o of Chest Pain: STAT if CP now or developed within 24 hours  1. Are you having CP right now? No (Friday 1/15)  2. Are you experiencing any other symptoms (ex. SOB, nausea, vomiting, sweating)? Headache, tired, little nausea, SOB when walking, back pains  3. How long have you been experiencing CP? Friday morning it began. Chest pain ended Friday but headache and neck pain has remained  4. Is your CP continuous or coming and going? continuous until medication  5. Have you taken Nitroglycerin? Yes, 2 in am and 2 in pm ?  Daughter said Saturday patient slurred some words occasionally

## 2019-03-31 NOTE — Telephone Encounter (Signed)
She reports that she got up Friday Morning early and was having tightness in her chest, pain in back, and also had pain in right shoulder into her neck and head. She took 3 tablets of nitro and then had another episode later on and took additional 2 tablets nitro that night. She was slurring some of her words and that did go away yesterday. She is still having head and back pain. Reviewed signs and symptoms which require immediate evaluation in the ED but patient refused to go. Let her know the risks of stroke and she states that if the slurred speech happens again then they would take her there. Scheduled her to come in and see NP this week given that she is currently refusing visit to ED. Daughter verbalized understanding of our conversation, agreeable to take her to ED if slurred speech continues, and had no further questions at this time.

## 2019-04-01 NOTE — Progress Notes (Signed)
Office Visit    Patient Name: Tammy [name] Date of Encounter: 04/02/2019  Primary Care Provider:  [name], DO Primary Cardiologist:  Ida Rogue, MD Electrophysiologist:  None   Chief Complaint    Tammy [name] is a 74 y.o. female with a hx of CAD, PAD with angioplasty of legs bilterally, COPD, CKD, DMII, PE 12/2013, HTN, back surgery 03/2012 with rods, remote smoking history, obesity, breast CA, chronic back pain with stimulator presents today for chest pain.    Past Medical History    Past Medical History:  Diagnosis Date  . Asthma   . Benign neoplasm of colon   . Cervical disc disease    "bulging" - no limitations per pt  . Chronic diastolic CHF (congestive heart failure) ([place])    a. 07/2012 Echo: Nl EF. mild inferoseptal HK, Gr 2 DD, mild conc LVH, mild PR/TR, mild PAH.  . CKD (chronic kidney disease), stage III   . COPD (chronic obstructive pulmonary disease) ([place])   . Coronary artery disease    a. 11/2011 Cath: LAD 52md, LCX min irregs, RCA 70p, 1069mith L->R collats, EF 60%-->Med Rx.  . Diabetes mellitus without complication ([facility]  . Fusion of lumbar spine 12/22/2015  . GERD (gastroesophageal reflux disease)   . History of DVT (deep vein thrombosis)   . History of tobacco abuse    a. Quit 2011.  . Marland Kitchenyperlipidemia   . Hypertension   . Hypothyroidism   . Obesity   . Palpitations   . PE (pulmonary embolism) 10/15  . PONV (postoperative nausea and vomiting)   . Pulmonary embolus ([facility]   RLL. Presented with SOB and elevated d-dimer.   . Marland KitchenVD (peripheral vascular disease) ([facility]   a. PTA of right leg and left femoral artery with stenosis.  . Renal insufficiency   . [facility]   TIAs - no deficits   Past Surgical History:  Procedure Laterality Date  . ANGIOPLASTY / STENTING FEMORAL     PVD; angioplasty right leg and left femoral artery with stenosis.   . APPENDECTOMY  Oct. 2016  . BACK SURGERY  03/2012  . BACK SURGERY     screws,  rods replaced with new hardware.  . Marland KitchenACK SURGERY  11/2016  . BREAST SURGERY    . CARDIAC CATHETERIZATION  12/07/2011   Mid LAD 50%, distal LAD 50%, mid RCA 70%, distal RCA 100%   . CARDIAC CATHETERIZATION  01/04/2011   100% occluded mid RCA with good collaterals from distal LAD, normal LVEF.  . Marland KitchenHOLECYSTECTOMY    . COLONOSCOPY  2013  . COLONOSCOPY WITH PROPOFOL N/A 05/24/2015   Procedure: COLONOSCOPY WITH PROPOFOL;  Surgeon: DaLucilla LameMD;  [facility] Service: Endoscopy;  Laterality: N/A;  Diabetic - oral meds  . COLONOSCOPY WITH PROPOFOL N/A 01/28/2019   Procedure: COLONOSCOPY WITH PROPOFOL;  Surgeon: TaVirgel ManifoldMD;  Location: [facility];  Service: Endoscopy;  Laterality: N/A;  . DIAGNOSTIC LAPAROSCOPY    . ESOPHAGOGASTRODUODENOSCOPY (EGD) WITH PROPOFOL N/A 01/28/2019   Procedure: ESOPHAGOGASTRODUODENOSCOPY (EGD) WITH PROPOFOL;  Surgeon: TaVirgel ManifoldMD;  Location: [facility] ENDOSCOPY;  Service: Endoscopy;  Laterality: N/A;  . LAPAROSCOPIC APPENDECTOMY N/A 01/06/2015   Procedure: APPENDECTOMY LAPAROSCOPIC;  Surgeon: SeChristene LyeMD;  Location: [facility] ORS;  Service: General;  Laterality: N/A;  . MASTECTOMY    . MASTECTOMY W/ SENTINEL NODE BIOPSY Left 08/26/2018   Procedure: PARTIAL MASTECTOMY WIDE EXCISION WITH SENTINEL  LYMPH NODE BIOPSY LEFT;  Surgeon: [name], MD;  Location: [facility] ORS;  Service: General;  Laterality: Left;  . POLYPECTOMY  05/24/2015   Procedure: POLYPECTOMY;  Surgeon: [name], MD;  Location: [place];  Service: Endoscopy;;  . THYROIDECTOMY    . TRIGGER FINGER RELEASE     Allergies  Allergies  Allergen Reactions  . Hydrocodone Other (See Comments)    Unresponsive  . Aleve [Naproxen Sodium]     Hives & itching   . Contrast Media [Iodinated Diagnostic Agents]     Pt avoids due to kidney issues  . Ioxaglate Other (See Comments)    (iodine) Pt avoids due to kidney issues   . Oxycodone Other  (See Comments)    hallucinations  . Ranexa [Ranolazine]     Sick on stomach  . Sulfa Antibiotics Other (See Comments)    Pt avoids due to kidney issues  . Tramadol Other (See Comments)    nightmares    History of Present Illness    Tammy [name] is a 74 y.o. female with a hx of  hx of CAD, PAD with angioplasty of legs bilterally, COPD, CKD, DMII, PE 12/2013, HTN, back surgery 03/2012 with rods, remote smoking history, obesity, breast CA, chronic back pain with stimulator. She was last seen by Dr. [name] 08/19/18 via telemedicine.  Cardiac cath 2013 with LAD 50%, prox RCA 70%, mid RCA 100%. Noted good collateral and recommended for medical management. Lexiscan myoview 03/28/18 with no significant ischemia, EF 73%, low risk scan.   Phone call 03/31/19 reporting chest pain 03/28/19. Daughter reported some slurred speech. She declined to go to ED for evaluation.   Her daughter is present for the exam.  The history and timeline were somewhat difficult to put together.  She has multiple complaints.  Tells me she had a fall right before Christmas and feels she injured her coccyx.  She had a CT lumbar 02/14/2019 noting prior lobar fusion with no suggestion of loosening, stable lumbar alignment with trace anterolisthesis on L4-L5.  Noted suprarenal abdominal aortic aneurysm at 3.5 cm recommended for ultrasound in 2 years.  She has been ordered a lumbar x-ray by her PCP on 03/25/2019 but she has not yet completed it.  She was seen by her PCP virtually 03/25/2019 and started on Augmentin for URI and prednisone for back pain.  She does me that took the medicine and stopped yesterday she did not feel it was helping her.  She was encouraged to complete her course of antibiotics.  She did not take the prednisone as she read the side effects and found that it could damage her kidneys.  Tried to reassure her that we would only prescribe medications that were safe.  Reports she had an episode of chest pain Friday  03/28/2019.  Woke up with chest pain.  She took 3 nitroglycerin and had relief.  Pain is reported as sharp in her midsternal area.  She also had sharp back pain that radiated into her head that improved after taking 3 extra strength Tylenol.  Tells me she had a fall Friday evening and had to call her daughter and son-in-law to help her get off the floor.  Tells me her right leg "went out ".  She had recurrent chest pain while sleeping Saturday 03/29/2019 that was relieved by 1 nitroglycerin.  Daughter tells me she was slurring her speech a bit on Saturday.   Tell she is me she has recurrent "chills all over  her body ".  Tells me she has had fatigue for a number of months but it has been much worse over the last week.  She reports right leg pain, her leg will "give way ".  Tells me that about 2 weeks she has a fall she knows it will come because she has a "jerking "the week before.  Fall before Christmas and 2 falls within the last 2 weeks.  Has had some chest pain today that is like a "baby pain" and is just a little bothersome. Woke up with it this morning.  Tells me she still has a dry office visit.  Checks her blood glucose regularly. Tells me it is routinely 150-160.  Has not checked her blood glucose during any of his episodes or after her fall.  She was encouraged to do so to assess for hypoglycemia.  She does not check her blood pressure routinely at home.  EKGs/Labs/Other Studies Reviewed:   The following studies were reviewed today: CT Lumbar Spine 02/14/19 IMPRESSION: 1. Prior lumbar fusion with posterior spinal rods and transpedicular screws L2 through L4 and L5 through S1 with non contiguous appearance of the posterior spinal rods at L4-L5. Hardware appears grossly intact, no definitive lucency to suggest loosening. 2. Stable lumbar alignment with trace anterolisthesis L4 on L5. No high-grade canal stenosis or foraminal narrowing by CT. 3. Suprarenal abdominal aortic aneurysm up to  3.5 cm. Recommend followup by ultrasound in 2 years. This recommendation follows ACR consensus guidelines: White Paper of the ACR Incidental Findings Committee II on Vascular Findings. J Am Coll Radiol 2013; 10:789-794. Aortic aneurysm NOS (ICD10-I71.9) 4. Chronic right hydronephrosis. 5. Sigmoid colon diverticular disease without acute inflammatory change  Lexiscan Myoview 03/28/18 Pharmacological myocardial perfusion imaging study with no significant  Ischemia Small region of fixed defect in the mid anteroseptal wall, likely attenuation artifact though unable to exclude prior infarct Significant GI uptake artifact Normal wall motion, EF estimated at 73% No EKG changes concerning for ischemia at peak stress or in recovery. Low risk scan  Cath 11/2011 Mid LAD 50%. Distal LAD 50%. Prox RCA 70%. Mid RCA 100%.  large vascular territory distal to the lesion and good collateral blood supply.   EKG:  EKG is ordered today.  The ekg ordered today demonstrates normal sinus rhythm 82 bpm no acute ST/T wave changes some nonspecific ST/T wave abnormalities with overall flat T waves.  Recent Labs: 12/04/2018: Hemoglobin 13.4; Platelets 228 01/02/2019: ALT 11; TSH 0.352 02/04/2019: BUN 32; Creatinine, Ser 1.79; Potassium 4.7; Sodium 137  Recent Lipid Panel    Component Value Date/Time   CHOL 138 01/02/2019 1034   CHOL 152 01/31/2016 1419   TRIG 248 (H) 01/02/2019 1034   TRIG 224 (H) 01/31/2016 1419   HDL 46 01/02/2019 1034   CHOLHDL 3.2 09/07/2015 0805   CHOLHDL 3.4 12/17/2014 1017   VLDL 45 (H) 01/31/2016 1419   LDLCALC 53 01/02/2019 1034    Home Medications   Current Meds  Medication Sig  . anastrozole (ARIMIDEX) 1 MG tablet TAKE ONE TABLET EVERY DAY  . apixaban (ELIQUIS) 2.5 MG TABS tablet Take 1 tablet (2.5 mg) by mouth twice daily  . blood glucose meter kit and supplies KIT Dispense based on patient and insurance preference. Use one time daily as directed, E11.9  . cholecalciferol  (VITAMIN D3) 25 MCG (1000 UT) tablet Take 1,000 Units by mouth daily.  . Dulaglutide (TRULICITY) 1.5 EZ/6.6QH SOPN Inject 1.5 mg into the skin every Tuesday.  [name]  DULoxetine (CYMBALTA) 60 MG capsule TAKE 1 CAPSULE EVERY DAY  . gabapentin (NEURONTIN) 300 MG capsule TAKE 1 CAPSULE BY MOUTH THREE TIMES DAILY  . glipiZIDE (GLUCOTROL) 5 MG tablet TAKE ONE TABLET EVERY DAY BEFORE BREAKFAST  . isosorbide mononitrate (IMDUR) 60 MG 24 hr tablet TAKE 1 TABLET BY MOUTH AT BEDTIME AND 1/2 TABLET IN THE MORNING  . JARDIANCE 25 MG TABS tablet TAKE ONE TABLET '25MG'$  EVERY DAY BEFORE BREAKFAST  . levothyroxine (SYNTHROID) 125 MCG tablet Take 1 tablet (125 mcg total) by mouth daily before breakfast.  . losartan (COZAAR) 25 MG tablet Take 1 tablet (25 mg total) by mouth daily.  . metoprolol succinate (TOPROL-XL) 50 MG 24 hr tablet TAKE 1 TABLET BY MOUTH DAILY WITH OR IMMEDIATLEY FOLLOWING A MEAL  . Multiple Vitamins-Minerals (ZINC PO) Take by mouth daily.  . nitroGLYCERIN (NITROSTAT) 0.4 MG SL tablet Place 1 tablet (0.4 mg total) under the tongue every 5 (five) minutes as needed for chest pain.  . pantoprazole (PROTONIX) 40 MG tablet TAKE 1 TABLET BY MOUTH DAILY AS NEEDED  . polyethylene glycol (MIRALAX MIX-IN PAX) 17 g packet Take 17 g by mouth daily.  . pregabalin (LYRICA) 150 MG capsule Take 1 capsule (150 mg total) by mouth 2 (two) times daily.  [name] PROAIR HFA 108 (90 Base) MCG/ACT inhaler TAKE 2 PUFFS EVERY 4 HOURS AS NEEDED FORWHEEZING OR SHORTNESS OF BREATH  . rosuvastatin (CRESTOR) 40 MG tablet TAKE 1 TABLET BY MOUTH DAILY  . trimethoprim (TRIMPEX) 100 MG tablet TAKE 1 TABLET BY MOUTH DAILY  . VITAMIN D PO Take by mouth daily. Vitamin D3 1000 mg    Review of Systems      Review of Systems  Constitution: Positive for malaise/fatigue. Negative for chills and fever.  Cardiovascular: Positive for chest pain and dyspnea on exertion. Negative for irregular heartbeat, leg swelling, near-syncope, orthopnea,  palpitations and syncope.  Respiratory: Negative for cough, shortness of breath and wheezing.   Musculoskeletal: Positive for back pain and falls.  Gastrointestinal: Negative for melena, nausea and vomiting.  Genitourinary: Negative for hematuria.  Neurological: Positive for weakness. Negative for dizziness and light-headedness.       (+) slurred speech Saturday   All other systems reviewed and are otherwise negative except as noted above.  Physical Exam    VS:  BP 120/80 (BP Location: Left Arm, Patient Position: Sitting, Cuff Size: Large)   Pulse 82   Ht '5\' 6"'$  (1.676 m)   Wt 205 lb (93 kg) Comment: Estimated by patient; unable to get on scale d/y weakness  SpO2 92%   BMI 33.09 kg/m  , BMI Body mass index is 33.09 kg/m. GEN: Well nourished, overweight, well developed, in no acute distress. HEENT: normal. Neck: Supple, no JVD, carotid bruits, or masses. Cardiac: RRR, no murmurs, rubs, or gallops. No clubbing, cyanosis, edema.  Radials/PT 2+ and equal bilaterally.  Respiratory:  Respirations regular and unlabored, clear to auscultation bilaterally. GI: Soft, nontender, nondistended. MS: No deformity or atrophy. Skin: Warm and dry, no rash. Neuro:  Strength and sensation are intact. Psych: Normal affect. Anxious.   Accessory Clinical Findings    ECG personally reviewed by me today -  normal sinus rhythm 82 bpm no acute ST/T wave changes some nonspecific ST/T wave abnormalities with overall flat T waves - no acute changes.  Assessment & Plan    1. CAD - Reports anginal symptoms of chest pain and worsened DOE. Chest pain Friday relieved by 3 nitroglycerin, chest pain  Saturday relieved by 1 nitroglycerin, some "mild" chest pain in the office today. Chest pain somewhat atypical as it occurs at rest, but concern given her hx of CAD and relief with nitroglycerin. Last cath 2013 with report, as above. Stress test 03/2018 which was low risk. EKG today SR with nonspecific ST/T wave changes -  overall flattening of t-waves. Case reviewed with Dr. Saunders Revel DOD - plan to proceed with cardiac catheterization this week her primary cardiologist Dr. [name]. The patient understands that risks include but are not limited to stroke (1 in 1000), death (1 in 31), kidney failure [usually temporary] (1 in 500), bleeding (1 in 200), allergic reaction [possibly serious] (1 in 200), and agrees to proceed.   BMET today, CBC today  2. HTN - BP well controlled. Continue present antihypertensive regimen. Encouraged to check BP when she has falls to assess for hypotension. Denies lightheadedness, dizziness, near syncope.   3. Slurred speech/RLE weakness - Episode Saturday reported by her daughter. Normal speech today, normal neuro exam. Discussed possibility of TIA vs hypoglycemia. She was again educated to report to the ED if these symptoms recur. She has had multiple episodes where her R leg "just gives out" as recently as Friday with fall. Encouraged to follow up with her PCP, I will send them a note as her R leg weakness and slurred speech are concerning for neurological component. She had CT head 01/2019 at [facility] which is unfortunately unable to be reviewed in [place]. Consider carotid dopplers at follow up. Last carotid doppler 2017 with less than 50% stenosis bilaterally.   4. DM2 - Reports blood sugars at home 150-160. Encouraged to check when she has episodes of falling or feeling poorly. Follows with her PCP.   5. HLD - Lipid profile 01/02/19 with total 138, HDL 46, LDL 53, triglycerides 248.   6. Hx DVT/PE - On Xarelto 2.71m BID for continued protection. Will hold 2 days prior to cath per procedure. Denies new SOB, no LE edema. As her most recent DVT/PE(2015) was many years ago, will not bridge prior to cath.   7. Current long term use of anticoagualtion - Reduced dose Xarelto 2.552mBID due to hx of DVT/PE and reduced renal function. Denies bleeding complications.   8. CKDIII - Follows with  nephrology. On Losartan for renal protection. Careful titration of antihypertensive and diuretic agents. IVF prior to upcoming cardiac catheterization.   9. Chronic back pain - Long standing history. Reports worsening since her fall in December.  [facility] by [facility]19. Tells me she did not take prednisone which was prescribed as it could "hurt her kidneys" did my best to reassure her that providers would monitor this prior to prescribing medications.   Disposition: BMET, CBC, COVID test today. Cardiac catheterization Friday. Follow up in 1 week(s) with Dr. GoRockey Situr APP. Strongly recommend to follow up with PCP regarding leg weakness and recurrent falls.   CaLoel DubonnetNP 04/02/2019, 2:24 PM

## 2019-04-02 ENCOUNTER — Ambulatory Visit (INDEPENDENT_AMBULATORY_CARE_PROVIDER_SITE_OTHER): Payer: Medicare Other | Admitting: Family

## 2019-04-02 ENCOUNTER — Encounter: Payer: Self-pay | Admitting: *Deleted

## 2019-04-02 ENCOUNTER — Other Ambulatory Visit: Payer: Self-pay

## 2019-04-02 ENCOUNTER — Encounter: Payer: Self-pay | Admitting: Family

## 2019-04-02 ENCOUNTER — Other Ambulatory Visit
Admission: RE | Admit: 2019-04-02 | Discharge: 2019-04-02 | Disposition: A | Discharge status: Home / Self Care | Payer: Medicare Other | Source: Ambulatory Visit | Attending: Cardiovascular Disease | Admitting: Cardiovascular Disease

## 2019-04-02 VITALS — BP 120/80 | HR 82 | Ht 66.0 in | Wt 205.0 lb

## 2019-04-02 DIAGNOSIS — M545 Low back pain, unspecified: Secondary | ICD-10-CM

## 2019-04-02 DIAGNOSIS — E782 Mixed hyperlipidemia: Secondary | ICD-10-CM | POA: Diagnosis not present

## 2019-04-02 DIAGNOSIS — I25118 Atherosclerotic heart disease of native coronary artery with other forms of angina pectoris: Secondary | ICD-10-CM | POA: Diagnosis not present

## 2019-04-02 DIAGNOSIS — I25111 Atherosclerotic heart disease of native coronary artery with angina pectoris with documented spasm: Secondary | ICD-10-CM | POA: Diagnosis not present

## 2019-04-02 DIAGNOSIS — N183 Chronic kidney disease, stage 3 unspecified: Secondary | ICD-10-CM

## 2019-04-02 DIAGNOSIS — I10 Essential (primary) hypertension: Secondary | ICD-10-CM | POA: Diagnosis not present

## 2019-04-02 DIAGNOSIS — R4781 Slurred speech: Secondary | ICD-10-CM | POA: Diagnosis not present

## 2019-04-02 DIAGNOSIS — Z20822 Contact with and (suspected) exposure to covid-19: Secondary | ICD-10-CM | POA: Insufficient documentation

## 2019-04-02 DIAGNOSIS — G8929 Other chronic pain: Secondary | ICD-10-CM

## 2019-04-02 DIAGNOSIS — I6523 Occlusion and stenosis of bilateral carotid arteries: Secondary | ICD-10-CM

## 2019-04-02 DIAGNOSIS — Z01812 Encounter for preprocedural laboratory examination: Secondary | ICD-10-CM | POA: Insufficient documentation

## 2019-04-02 DIAGNOSIS — I25110 Atherosclerotic heart disease of native coronary artery with unstable angina pectoris: Secondary | ICD-10-CM | POA: Diagnosis not present

## 2019-04-02 NOTE — Patient Instructions (Addendum)
Medication Instructions:  None ordered. *If you need a refill on your cardiac medications before your next appointment, please call your pharmacy*  Lab Work: You had a BMET and CBC done today.  If you have labs (blood work) drawn today and your tests are completely normal, you will receive your results only by: [name] MyChart Message (if you have MyChart) OR . A paper copy in the mail If you have any lab test that is abnormal or we need to change your treatment, we will call you to review the results.  Testing/Procedures:  You need to go now to the [facility] drive through for your COVID test.    [address] [place], [place] [address] Dept: [phone] Loc: [place]  04/02/2019  You are scheduled for a Cardiac Catheterization on Friday, January 23 with Dr. Ida Rogue.  1. Please arrive at the [place] at [facility]: [address], [place] [address] at 6:30 AM (This time is four hours before your procedure to ensure your preparation). Free valet parking service is available.   Special note: Every effort is made to have your procedure done on time. Please understand that emergencies sometimes delay scheduled procedures.  2. Diet: Do not eat solid foods after midnight.  The patient may have clear liquids until 5am upon the day of the procedure.  3. Labs: You will need to have blood drawn on today.  4. Medication instructions in preparation for your procedure:  Stop taking Eliquis (Apixiban) on Wednesday, January 20.  Stop taking, Cozaar (Losartan) Thursday, January 21,   On the morning of your procedure, take 81 mg Aspirin and any morning medicines NOT listed above.  You may use sips of water.  5. Plan for one night stay--bring personal belongings. 6. Bring a current list of your medications and current insurance cards. 7.  You MUST have a responsible person to drive you home. 8. Someone MUST be with you the first 24 hours after you arrive home or your discharge will be delayed. 9. Please wear clothes that are easy to get on and off and wear slip-on shoes.  Thank you for allowing [place] to care for you!   --  Invasive Cardiovascular services Follow-Up: At [facility], you and your health needs are our priority.  As part of our continuing mission to provide you with exceptional heart care, we have created designated Provider Care Teams.  These Care Teams include your primary Cardiologist (physician) and Advanced Practice Providers (APPs -  Physician Assistants and Nurse Practitioners) who all work together to provide you with the care you need, when you need it.  Your next appointment:   1 week(s)  The format for your next appointment:   In Person  Provider:    You may see Ida Rogue, MD   or one of the following Advanced Practice Providers on your designated Care Team:    Murray Hodgkins, NP  Christell Faith, PA-C  Marrianne Mood, PA-C   Other Instructions NA

## 2019-04-03 ENCOUNTER — Telehealth: Payer: Self-pay

## 2019-04-03 ENCOUNTER — Telehealth: Payer: Self-pay | Admitting: Family Medicine

## 2019-04-03 DIAGNOSIS — I200 Unstable angina: Secondary | ICD-10-CM

## 2019-04-03 LAB — CBC WITH DIFFERENTIAL/PLATELET
Basophils Absolute: 0.1 10*3/uL (ref 0.0–0.2)
Basos: 1 %
EOS (ABSOLUTE): 0.2 10*3/uL (ref 0.0–0.4)
Eos: 2 %
Hematocrit: 43.2 % (ref 34.0–46.6)
Hemoglobin: 14.4 g/dL (ref 11.1–15.9)
Immature Grans (Abs): 0.1 10*3/uL (ref 0.0–0.1)
Immature Granulocytes: 1 %
Lymphocytes Absolute: 2.3 10*3/uL (ref 0.7–3.1)
Lymphs: 24 %
MCH: 29.9 pg (ref 26.6–33.0)
MCHC: 33.3 g/dL (ref 31.5–35.7)
MCV: 90 fL (ref 79–97)
Monocytes Absolute: 0.8 10*3/uL (ref 0.1–0.9)
Monocytes: 8 %
Neutrophils Absolute: 6.2 10*3/uL (ref 1.4–7.0)
Neutrophils: 64 %
Platelets: 292 10*3/uL (ref 150–450)
RBC: 4.82 x10E6/uL (ref 3.77–5.28)
RDW: 14.5 % (ref 11.7–15.4)
WBC: 9.7 10*3/uL (ref 3.4–10.8)

## 2019-04-03 LAB — BASIC METABOLIC PANEL
BUN/Creatinine Ratio: 20 (ref 12–28)
BUN: 35 mg/dL — ABNORMAL HIGH (ref 8–27)
CO2: 22 mmol/L (ref 20–29)
Calcium: 9.8 mg/dL (ref 8.7–10.3)
Chloride: 101 mmol/L (ref 96–106)
Creatinine, Ser: 1.76 mg/dL — ABNORMAL HIGH (ref 0.57–1.00)
GFR calc Af Amer: 32 mL/min/{1.73_m2} — ABNORMAL LOW (ref >59)
GFR calc non Af Amer: 28 mL/min/{1.73_m2} — ABNORMAL LOW (ref >59)
Glucose: 213 mg/dL — ABNORMAL HIGH (ref 65–99)
Potassium: 4.9 mmol/L (ref 3.5–5.2)
Sodium: 140 mmol/L (ref 134–144)

## 2019-04-03 LAB — SARS CORONAVIRUS 2 (TAT 6-24 HRS): SARS Coronavirus 2: NEGATIVE

## 2019-04-03 NOTE — Addendum Note (Signed)
Addended by: Loel Dubonnet on: 04/03/2019 05:06 PM   Modules accepted: Orders

## 2019-04-03 NOTE — Telephone Encounter (Signed)
Copied from [place] [id number]. Topic: Appointment Scheduling - Scheduling Inquiry for Clinic >> Apr 03, 2019  4:18 PM Virl Axe D wrote: Reason for CRM: Pt's daughter Kieth Brightly called to see if Dr. Wynetta Emery could work pt into her schedule next week. No openings until 04/16/19. Pt saw her cardiologist due to chest pains and they will be monitoring this but pt has been sleeping 12 hours during the day and they are not sure what is causing this. They want pt to be seen in person. Were not comfortable with a NP. Only Dr. Wynetta Emery or Apolonio Schneiders

## 2019-04-03 NOTE — Telephone Encounter (Signed)
OK to use a same day

## 2019-04-03 NOTE — Telephone Encounter (Signed)
Called and spoke with the patient regarding her lab results per [name], NP recommendations.  Pt had no questions regarding information.

## 2019-04-04 ENCOUNTER — Encounter: Payer: Self-pay | Admitting: Cardiovascular Disease

## 2019-04-04 ENCOUNTER — Encounter
Admission: RE | Disposition: A | Discharge status: Home / Self Care | Payer: Self-pay | Source: Home / Self Care | Attending: Cardiovascular Disease

## 2019-04-04 ENCOUNTER — Other Ambulatory Visit: Payer: Self-pay

## 2019-04-04 ENCOUNTER — Ambulatory Visit
Admission: RE | Admit: 2019-04-04 | Discharge: 2019-04-04 | Disposition: A | Discharge status: Home / Self Care | Payer: Medicare Other | Attending: Cardiovascular Disease | Admitting: Cardiovascular Disease

## 2019-04-04 DIAGNOSIS — R4781 Slurred speech: Secondary | ICD-10-CM | POA: Insufficient documentation

## 2019-04-04 DIAGNOSIS — N183 Chronic kidney disease, stage 3 unspecified: Secondary | ICD-10-CM | POA: Insufficient documentation

## 2019-04-04 DIAGNOSIS — Z7984 Long term (current) use of oral hypoglycemic drugs: Secondary | ICD-10-CM | POA: Insufficient documentation

## 2019-04-04 DIAGNOSIS — E1151 Type 2 diabetes mellitus with diabetic peripheral angiopathy without gangrene: Secondary | ICD-10-CM | POA: Diagnosis not present

## 2019-04-04 DIAGNOSIS — Z79890 Hormone replacement therapy (postmenopausal): Secondary | ICD-10-CM | POA: Insufficient documentation

## 2019-04-04 DIAGNOSIS — I130 Hypertensive heart and chronic kidney disease with heart failure and stage 1 through stage 4 chronic kidney disease, or unspecified chronic kidney disease: Secondary | ICD-10-CM | POA: Diagnosis not present

## 2019-04-04 DIAGNOSIS — I25110 Atherosclerotic heart disease of native coronary artery with unstable angina pectoris: Secondary | ICD-10-CM

## 2019-04-04 DIAGNOSIS — Z86718 Personal history of other venous thrombosis and embolism: Secondary | ICD-10-CM | POA: Diagnosis not present

## 2019-04-04 DIAGNOSIS — Z79899 Other long term (current) drug therapy: Secondary | ICD-10-CM | POA: Diagnosis not present

## 2019-04-04 DIAGNOSIS — R079 Chest pain, unspecified: Secondary | ICD-10-CM

## 2019-04-04 DIAGNOSIS — G8929 Other chronic pain: Secondary | ICD-10-CM | POA: Insufficient documentation

## 2019-04-04 DIAGNOSIS — M549 Dorsalgia, unspecified: Secondary | ICD-10-CM | POA: Insufficient documentation

## 2019-04-04 DIAGNOSIS — E785 Hyperlipidemia, unspecified: Secondary | ICD-10-CM | POA: Diagnosis not present

## 2019-04-04 DIAGNOSIS — K219 Gastro-esophageal reflux disease without esophagitis: Secondary | ICD-10-CM | POA: Insufficient documentation

## 2019-04-04 DIAGNOSIS — E1122 Type 2 diabetes mellitus with diabetic chronic kidney disease: Secondary | ICD-10-CM | POA: Insufficient documentation

## 2019-04-04 DIAGNOSIS — R531 Weakness: Secondary | ICD-10-CM | POA: Insufficient documentation

## 2019-04-04 DIAGNOSIS — I5032 Chronic diastolic (congestive) heart failure: Secondary | ICD-10-CM | POA: Insufficient documentation

## 2019-04-04 DIAGNOSIS — Z853 Personal history of malignant neoplasm of breast: Secondary | ICD-10-CM | POA: Diagnosis not present

## 2019-04-04 DIAGNOSIS — E669 Obesity, unspecified: Secondary | ICD-10-CM | POA: Insufficient documentation

## 2019-04-04 DIAGNOSIS — Z87891 Personal history of nicotine dependence: Secondary | ICD-10-CM | POA: Insufficient documentation

## 2019-04-04 DIAGNOSIS — Z7901 Long term (current) use of anticoagulants: Secondary | ICD-10-CM | POA: Diagnosis not present

## 2019-04-04 DIAGNOSIS — Z6833 Body mass index (BMI) 33.0-33.9, adult: Secondary | ICD-10-CM | POA: Diagnosis not present

## 2019-04-04 DIAGNOSIS — I200 Unstable angina: Secondary | ICD-10-CM

## 2019-04-04 DIAGNOSIS — Z86711 Personal history of pulmonary embolism: Secondary | ICD-10-CM | POA: Diagnosis not present

## 2019-04-04 DIAGNOSIS — E039 Hypothyroidism, unspecified: Secondary | ICD-10-CM | POA: Insufficient documentation

## 2019-04-04 DIAGNOSIS — J449 Chronic obstructive pulmonary disease, unspecified: Secondary | ICD-10-CM | POA: Diagnosis not present

## 2019-04-04 HISTORY — PX: LEFT HEART CATH AND CORONARY ANGIOGRAPHY: CATH118249

## 2019-04-04 LAB — GLUCOSE, CAPILLARY: Glucose-Capillary: 178 mg/dL — ABNORMAL HIGH (ref 70–99)

## 2019-04-04 SURGERY — LEFT HEART CATH AND CORONARY ANGIOGRAPHY
Anesthesia: Moderate Sedation | Laterality: Left

## 2019-04-04 MED ORDER — SODIUM CHLORIDE 0.9 % IV SOLN: INTRAVENOUS | Status: DC

## 2019-04-04 MED ORDER — ASPIRIN 81 MG PO CHEW: 81.0000 mg | CHEWABLE_TABLET | ORAL | Status: DC

## 2019-04-04 MED ORDER — FENTANYL CITRATE (PF) 100 MCG/2ML IJ SOLN
INTRAMUSCULAR | Status: DC | PRN
  Administered 2019-04-04: 25 ug via INTRAVENOUS

## 2019-04-04 MED ORDER — HEPARIN (PORCINE) IN NACL 1000-0.9 UT/500ML-% IV SOLN
INTRAVENOUS | Status: DC | PRN
  Administered 2019-04-04: 500 mL

## 2019-04-04 MED ORDER — FENTANYL CITRATE (PF) 100 MCG/2ML IJ SOLN
INTRAMUSCULAR | Status: AC
  Filled 2019-04-04: qty 2

## 2019-04-04 MED ORDER — SODIUM CHLORIDE 0.9 % IV SOLN: 250.0000 mL | INTRAVENOUS | Status: DC | PRN

## 2019-04-04 MED ORDER — SODIUM CHLORIDE 0.9% FLUSH: 3.0000 mL | Freq: Two times a day (BID) | INTRAVENOUS | Status: DC

## 2019-04-04 MED ORDER — MIDAZOLAM HCL 2 MG/2ML IJ SOLN
INTRAMUSCULAR | Status: DC | PRN
  Administered 2019-04-04: 1 mg via INTRAVENOUS

## 2019-04-04 MED ORDER — ASPIRIN 81 MG PO CHEW
CHEWABLE_TABLET | ORAL | Status: AC
  Filled 2019-04-04: qty 1

## 2019-04-04 MED ORDER — MIDAZOLAM HCL 2 MG/2ML IJ SOLN
INTRAMUSCULAR | Status: AC
  Filled 2019-04-04: qty 2

## 2019-04-04 MED ORDER — SODIUM CHLORIDE 0.9% FLUSH: 3.0000 mL | INTRAVENOUS | Status: DC | PRN

## 2019-04-04 MED ORDER — IOHEXOL 300 MG/ML  SOLN
INTRAMUSCULAR | Status: DC | PRN
  Administered 2019-04-04: 13:00:00 60 mL

## 2019-04-04 MED ORDER — HEPARIN (PORCINE) IN NACL 1000-0.9 UT/500ML-% IV SOLN
INTRAVENOUS | Status: AC
  Filled 2019-04-04: qty 1000

## 2019-04-04 SURGICAL SUPPLY — 8 items
CATH INFINITI 5FR JL4 (CATHETERS) ×2 IMPLANT
CATH INFINITI JR4 5F (CATHETERS) ×2 IMPLANT
DEVICE CLOSURE MYNXGRIP 5F (Vascular Products) ×2 IMPLANT
KIT MANI 3VAL PERCEP (MISCELLANEOUS) ×2 IMPLANT
NEEDLE PERC 18GX7CM (NEEDLE) ×2 IMPLANT
PACK CARDIAC CATH (CUSTOM PROCEDURE TRAY) ×2 IMPLANT
SHEATH AVANTI 5FR X 11CM (SHEATH) ×2 IMPLANT
WIRE GUIDERIGHT .035X150 (WIRE) ×2 IMPLANT

## 2019-04-04 NOTE — Telephone Encounter (Signed)
Pt went to the ED around 6:30AM.

## 2019-04-04 NOTE — Discharge Instructions (Signed)

## 2019-04-04 NOTE — H&P (Signed)
H&P Addendum, precardiac catheterization  Patient was seen and evaluated prior to Cardiac catheterization procedure Symptoms, prior testing details again confirmed with the patient Patient examined, no significant change from prior exam Lab work reviewed in detail personally by myself Patient understands risk and benefit of the procedure, willing to proceed  Signed, Tim Kaylee Wombles, MD, Ph.D [facility]    

## 2019-04-08 ENCOUNTER — Ambulatory Visit
Admission: RE | Admit: 2019-04-08 | Discharge: 2019-04-08 | Disposition: A | Discharge status: Home / Self Care | Payer: Medicare Other | Attending: Family Medicine | Admitting: Family Medicine

## 2019-04-08 ENCOUNTER — Ambulatory Visit (INDEPENDENT_AMBULATORY_CARE_PROVIDER_SITE_OTHER): Payer: Medicare Other | Admitting: Family Medicine

## 2019-04-08 ENCOUNTER — Ambulatory Visit
Admission: RE | Admit: 2019-04-08 | Discharge: 2019-04-08 | Disposition: A | Discharge status: Home / Self Care | Payer: Medicare Other | Source: Ambulatory Visit | Attending: Family Medicine | Admitting: Family Medicine

## 2019-04-08 ENCOUNTER — Encounter: Payer: Self-pay | Admitting: Family Medicine

## 2019-04-08 ENCOUNTER — Other Ambulatory Visit: Payer: Self-pay

## 2019-04-08 VITALS — BP 122/76 | HR 75 | Temp 97.5°F

## 2019-04-08 DIAGNOSIS — I700 Atherosclerosis of aorta: Secondary | ICD-10-CM

## 2019-04-08 DIAGNOSIS — R5382 Chronic fatigue, unspecified: Secondary | ICD-10-CM

## 2019-04-08 DIAGNOSIS — I25110 Atherosclerotic heart disease of native coronary artery with unstable angina pectoris: Secondary | ICD-10-CM

## 2019-04-08 DIAGNOSIS — I77811 Abdominal aortic ectasia: Secondary | ICD-10-CM | POA: Diagnosis not present

## 2019-04-08 DIAGNOSIS — E118 Type 2 diabetes mellitus with unspecified complications: Secondary | ICD-10-CM

## 2019-04-08 DIAGNOSIS — G8929 Other chronic pain: Secondary | ICD-10-CM | POA: Diagnosis not present

## 2019-04-08 DIAGNOSIS — IMO0002 Reserved for concepts with insufficient information to code with codable children: Secondary | ICD-10-CM

## 2019-04-08 DIAGNOSIS — I739 Peripheral vascular disease, unspecified: Secondary | ICD-10-CM

## 2019-04-08 DIAGNOSIS — I129 Hypertensive chronic kidney disease with stage 1 through stage 4 chronic kidney disease, or unspecified chronic kidney disease: Secondary | ICD-10-CM

## 2019-04-08 DIAGNOSIS — R079 Chest pain, unspecified: Secondary | ICD-10-CM

## 2019-04-08 DIAGNOSIS — I200 Unstable angina: Secondary | ICD-10-CM | POA: Diagnosis not present

## 2019-04-08 DIAGNOSIS — E1165 Type 2 diabetes mellitus with hyperglycemia: Secondary | ICD-10-CM | POA: Diagnosis not present

## 2019-04-08 DIAGNOSIS — R0602 Shortness of breath: Secondary | ICD-10-CM | POA: Diagnosis not present

## 2019-04-08 DIAGNOSIS — S3992XA Unspecified injury of lower back, initial encounter: Secondary | ICD-10-CM | POA: Diagnosis not present

## 2019-04-08 DIAGNOSIS — M5441 Lumbago with sciatica, right side: Secondary | ICD-10-CM | POA: Diagnosis not present

## 2019-04-08 DIAGNOSIS — M791 Myalgia, unspecified site: Secondary | ICD-10-CM

## 2019-04-08 DIAGNOSIS — E559 Vitamin D deficiency, unspecified: Secondary | ICD-10-CM

## 2019-04-08 DIAGNOSIS — E039 Hypothyroidism, unspecified: Secondary | ICD-10-CM

## 2019-04-08 DIAGNOSIS — I6523 Occlusion and stenosis of bilateral carotid arteries: Secondary | ICD-10-CM

## 2019-04-08 DIAGNOSIS — M533 Sacrococcygeal disorders, not elsewhere classified: Secondary | ICD-10-CM | POA: Insufficient documentation

## 2019-04-08 DIAGNOSIS — J449 Chronic obstructive pulmonary disease, unspecified: Secondary | ICD-10-CM

## 2019-04-08 DIAGNOSIS — M545 Low back pain: Secondary | ICD-10-CM | POA: Diagnosis not present

## 2019-04-08 LAB — BAYER DCA HB A1C WAIVED: HB A1C (BAYER DCA - WAIVED): 8.3 % — ABNORMAL HIGH (ref <7.0)

## 2019-04-08 MED ORDER — SUCRALFATE 1 G PO TABS: 1.0000 g | ORAL_TABLET | Freq: Three times a day (TID) | ORAL | 2 refills | Status: DC

## 2019-04-08 NOTE — Progress Notes (Signed)
BP 122/76 (BP Location: Left Arm, Patient Position: Sitting, Cuff Size: Normal)   Pulse 75   Temp (!) 97.5 F (36.4 C) (Oral)   SpO2 98%    Subjective:    Patient ID: Tammy [name], female    DOB: 1945/11/07, 74 y.o.   MRN: [id number]  HPI: Tammy [name] is a 74 y.o. female  Chief Complaint  Patient presents with  . Hospitalization Follow-up  . Chest Pain  . Pain    right side  . Nasal Congestion    with nose bleeds  . Fatigue    all the time  . Chills   Saw cardiology about a week ago. Saw cardiology and then went to the ER and was admitted for a cardiac cath. Prior to going to the ER she had 2 nights of bad chest pain and then in the AM was sick on her stomach and was throwing up before she went. She is not throwing up since she got of the hospital, but she has been having GERD and feeling sick on her stomach for the past couple of days. Has been drinking the diet ginger ale  HOSPITAL FOLLOW UP Time since discharge: 4 days Hospital/facility: [facility] Diagnosis: CAD:  Prox RCA to Mid RCA lesion is 100% stenosed.  Mid LAD lesion is 45% stenosed.  There is no aortic valve stenosis. There is no aortic valve regurgitation. Procedures/tests: Cardiac cath Consultants: Cardiology New medications: increase Imdur and consider ranexa Discharge instructions:  Follow up with cardiology tomorrow Status: stable  DIABETES Hypoglycemic episodes:no Polydipsia/polyuria: no Visual disturbance: no Chest pain: yes Paresthesias: yes Glucose Monitoring: yes  Accucheck frequency: occasionally Taking Insulin?: no Blood Pressure Monitoring: not checking Retinal Examination: Not up to Date Foot Exam: Up to Date Diabetic Education: Completed Pneumovax: Up to Date Influenza: Up to Date Aspirin: yes  HYPOTHYROIDISM Thyroid control status:stable Satisfied with current treatment? yes Medication side effects: no Medication compliance: excellent compliance Recent dose adjustment:no Fatigue:  no Cold intolerance: no Heat intolerance: no Weight gain: no Weight loss: no Constipation: no Diarrhea/loose stools: no Palpitations: no Lower extremity edema: no Anxiety/depressed mood: no   Backside and low back still hurting since her fall in December. She is still feeling tight and sore. She has not gotten her x-ray yet. She is moving OK, but notes that as she changes positions, she hs quite a bit of pain. She notes that she is still sleeping a lot, but not as much. She is otherwise doing well with no other concerns or complaints at this time.   Relevant past medical, surgical, family and social history reviewed and updated as indicated. Interim medical history since our last visit reviewed. Allergies and medications reviewed and updated.  Review of Systems  Respiratory: Positive for shortness of breath (with trying to walk or with back pain). Negative for apnea, cough, choking, chest tightness, wheezing and stridor.   Cardiovascular: Positive for chest pain (little ones). Negative for palpitations and leg swelling.  Gastrointestinal: Positive for abdominal pain, constipation (alternating), diarrhea (alternating) and nausea. Negative for abdominal distention, anal bleeding, blood in stool, rectal pain and vomiting.  Musculoskeletal: Positive for back pain and myalgias. Negative for arthralgias, gait problem, joint swelling, neck pain and neck stiffness.  Psychiatric/Behavioral: Negative.     Per HPI unless specifically indicated above     Objective:    BP 122/76 (BP Location: Left Arm, Patient Position: Sitting, Cuff Size: Normal)   Pulse 75   Temp (!) 97.5 F (  36.4 C) (Oral)   SpO2 98%   Wt Readings from Last 3 Encounters:  04/09/19 205 lb (93 kg)  04/04/19 205 lb (93 kg)  04/02/19 205 lb (93 kg)    Physical Exam Constitutional:      General: She is not in acute distress.    Appearance: She is well-developed.  HENT:     Head: Normocephalic and atraumatic.     Right  Ear: Hearing normal.     Left Ear: Hearing normal.     Nose: Nose normal.  Eyes:     General: Lids are normal. No scleral icterus.       Right eye: No discharge.        Left eye: No discharge.     Conjunctiva/sclera: Conjunctivae normal.  Pulmonary:     Effort: Pulmonary effort is normal. No respiratory distress.  Musculoskeletal:        General: Normal range of motion.  Skin:    General: Skin is warm and dry.     Capillary Refill: Capillary refill takes less than 2 seconds.     Coloration: Skin is not cyanotic or pale.     Findings: No ecchymosis, erythema or rash.     Nails: There is no clubbing.  Neurological:     Mental Status: She is alert and oriented to person, place, and time.  Psychiatric:        Speech: Speech normal.        Behavior: Behavior normal.        Thought Content: Thought content normal.        Judgment: Judgment normal.     Results for orders placed or performed in visit on 04/08/19  CBC with Differential/Platelet  Result Value Ref Range   WBC 9.1 3.4 - 10.8 x10E3/uL   RBC 4.68 3.77 - 5.28 x10E6/uL   Hemoglobin 14.0 11.1 - 15.9 g/dL   Hematocrit 42.4 34.0 - 46.6 %   MCV 91 79 - 97 fL   MCH 29.9 26.6 - 33.0 pg   MCHC 33.0 31.5 - 35.7 g/dL   RDW 14.1 11.7 - 15.4 %   Platelets 275 150 - 450 x10E3/uL   Neutrophils 62 Not Estab. %   Lymphs 25 Not Estab. %   Monocytes 9 Not Estab. %   Eos 2 Not Estab. %   Basos 1 Not Estab. %   Neutrophils Absolute 5.6 1.4 - 7.0 x10E3/uL   Lymphocytes Absolute 2.3 0.7 - 3.1 x10E3/uL   Monocytes Absolute 0.8 0.1 - 0.9 x10E3/uL   EOS (ABSOLUTE) 0.2 0.0 - 0.4 x10E3/uL   Basophils Absolute 0.1 0.0 - 0.2 x10E3/uL   Immature Granulocytes 1 Not Estab. %   Immature Grans (Abs) 0.1 0.0 - 0.1 x10E3/uL  Comprehensive metabolic panel  Result Value Ref Range   Glucose 187 (H) 65 - 99 mg/dL   BUN 24 8 - 27 mg/dL   Creatinine, Ser 1.54 (H) 0.57 - 1.00 mg/dL   GFR calc non Af Amer 33 (L) >59 mL/min/1.73   GFR calc Af Amer 38  (L) >59 mL/min/1.73   BUN/Creatinine Ratio 16 12 - 28   Sodium 140 134 - 144 mmol/L   Potassium 5.9 (H) 3.5 - 5.2 mmol/L   Chloride 101 96 - 106 mmol/L   CO2 26 20 - 29 mmol/L   Calcium 9.7 8.7 - 10.3 mg/dL   Total Protein 7.1 6.0 - 8.5 g/dL   Albumin 4.2 3.7 - 4.7 g/dL   Globulin, Total 2.9 1.5 -  4.5 g/dL   Albumin/Globulin Ratio 1.4 1.2 - 2.2   Bilirubin Total 0.5 0.0 - 1.2 mg/dL   Alkaline Phosphatase 93 39 - 117 IU/L   AST 15 0 - 40 IU/L   ALT 11 0 - 32 IU/L  TSH  Result Value Ref Range   TSH 13.500 (H) 0.450 - 4.500 uIU/mL  VITAMIN D 25 Hydroxy (Vit-D Deficiency, Fractures)  Result Value Ref Range   Vit D, 25-Hydroxy 32.6 30.0 - 100.0 ng/mL  Bayer DCA Hb A1c Waived  Result Value Ref Range   HB A1C (BAYER DCA - WAIVED) 8.3 (H) <7.0 %      Assessment & Plan:   Problem List Items Addressed This Visit      Cardiovascular and Mediastinum   Coronary atherosclerosis of native coronary artery    Following with cardiology. Will keep BP, sugars and cholesterol under good control. Call with any concerns.       PAD (peripheral artery disease) [facility])    Following with cardiology. Will keep BP, sugars and cholesterol under good control. Call with any concerns.       Coronary artery disease    Following with cardiology. Will keep BP, sugars and cholesterol under good control. Call with any concerns.       Abdominal aortic atherosclerosis ([place])    Will keep BP, sugars and cholesterol under good control. Call with any concerns.       Abdominal aortic ectasia [facility])    Following with cardiology. Will keep BP, sugars and cholesterol under good control. Call with any concerns.       Calcification of both carotid arteries   Unstable angina [facility])    Following with cardiology. Will keep BP, sugars and cholesterol under good control. Call with any concerns.       Relevant Orders   CBC with Differential/Platelet (Completed)   Comprehensive metabolic panel (Completed)      Respiratory   COPD (chronic obstructive pulmonary disease) (HCC)    Continue current regimen. Continue to monitor. Refills given today.         Endocrine   Uncontrolled type 2 diabetes mellitus with complication, without long-term current use of insulin ([place])    Doing better with A1c of 8.3. Continue diet and exercise and current regimen. Continue to monitor. Recheck 3 months.       Relevant Orders   CBC with Differential/Platelet (Completed)   Comprehensive metabolic panel (Completed)   Bayer DCA Hb A1c Waived (Completed)   Hypothyroid    Rechecking levels today. Await results. Treat as needed.       Relevant Orders   CBC with Differential/Platelet (Completed)   Comprehensive metabolic panel (Completed)   TSH (Completed)     Nervous and Auditory   Chronic right-sided low back pain with right-sided sciatica    Will go get her x-ray. Await results. Call with any concerns.       Relevant Orders   CBC with Differential/Platelet (Completed)   Comprehensive metabolic panel (Completed)     Genitourinary   Benign hypertensive renal disease    Under good control on current regimen. Continue current regimen. Continue to monitor. Call with any concerns. Refills given. Labs drawn today.       Relevant Orders   CBC with Differential/Platelet (Completed)   Comprehensive metabolic panel (Completed)    Other Visit Diagnoses    Chest pain, unspecified type    -  Primary   Following with cardiology. Will obtain CXR. Call with any  concerns.    Relevant Orders   DG Chest 2 View (Completed)   CBC with Differential/Platelet (Completed)   Comprehensive metabolic panel (Completed)   Chronic fatigue       Checking labs. Await results.    Relevant Orders   Comprehensive metabolic panel (Completed)   Myalgia       Checking labs. Await results.    Vitamin D deficiency       Rechecking levels. Await results.    Relevant Orders   VITAMIN D 25 Hydroxy (Vit-D Deficiency, Fractures)  (Completed)       Follow up plan: Return in about 4 weeks (around 05/06/2019).

## 2019-04-09 ENCOUNTER — Ambulatory Visit (INDEPENDENT_AMBULATORY_CARE_PROVIDER_SITE_OTHER): Payer: Medicare Other | Admitting: Family

## 2019-04-09 ENCOUNTER — Encounter: Payer: Self-pay | Admitting: Family

## 2019-04-09 VITALS — BP 130/62 | HR 80 | Ht 66.0 in | Wt 205.0 lb

## 2019-04-09 DIAGNOSIS — G8929 Other chronic pain: Secondary | ICD-10-CM | POA: Diagnosis not present

## 2019-04-09 DIAGNOSIS — M545 Low back pain, unspecified: Secondary | ICD-10-CM

## 2019-04-09 DIAGNOSIS — I10 Essential (primary) hypertension: Secondary | ICD-10-CM | POA: Diagnosis not present

## 2019-04-09 DIAGNOSIS — E782 Mixed hyperlipidemia: Secondary | ICD-10-CM

## 2019-04-09 DIAGNOSIS — I25110 Atherosclerotic heart disease of native coronary artery with unstable angina pectoris: Secondary | ICD-10-CM | POA: Diagnosis not present

## 2019-04-09 DIAGNOSIS — N183 Chronic kidney disease, stage 3 unspecified: Secondary | ICD-10-CM | POA: Diagnosis not present

## 2019-04-09 DIAGNOSIS — E875 Hyperkalemia: Secondary | ICD-10-CM

## 2019-04-09 DIAGNOSIS — R5383 Other fatigue: Secondary | ICD-10-CM

## 2019-04-09 LAB — COMPREHENSIVE METABOLIC PANEL
ALT: 11 IU/L (ref 0–32)
AST: 15 IU/L (ref 0–40)
Albumin/Globulin Ratio: 1.4 (ref 1.2–2.2)
Albumin: 4.2 g/dL (ref 3.7–4.7)
Alkaline Phosphatase: 93 IU/L (ref 39–117)
BUN/Creatinine Ratio: 16 (ref 12–28)
BUN: 24 mg/dL (ref 8–27)
Bilirubin Total: 0.5 mg/dL (ref 0.0–1.2)
CO2: 26 mmol/L (ref 20–29)
Calcium: 9.7 mg/dL (ref 8.7–10.3)
Chloride: 101 mmol/L (ref 96–106)
Creatinine, Ser: 1.54 mg/dL — ABNORMAL HIGH (ref 0.57–1.00)
GFR calc Af Amer: 38 mL/min/{1.73_m2} — ABNORMAL LOW (ref >59)
GFR calc non Af Amer: 33 mL/min/{1.73_m2} — ABNORMAL LOW (ref >59)
Globulin, Total: 2.9 g/dL (ref 1.5–4.5)
Glucose: 187 mg/dL — ABNORMAL HIGH (ref 65–99)
Potassium: 5.9 mmol/L — ABNORMAL HIGH (ref 3.5–5.2)
Sodium: 140 mmol/L (ref 134–144)
Total Protein: 7.1 g/dL (ref 6.0–8.5)

## 2019-04-09 LAB — CBC WITH DIFFERENTIAL/PLATELET
Basophils Absolute: 0.1 10*3/uL (ref 0.0–0.2)
Basos: 1 %
EOS (ABSOLUTE): 0.2 10*3/uL (ref 0.0–0.4)
Eos: 2 %
Hematocrit: 42.4 % (ref 34.0–46.6)
Hemoglobin: 14 g/dL (ref 11.1–15.9)
Immature Grans (Abs): 0.1 10*3/uL (ref 0.0–0.1)
Immature Granulocytes: 1 %
Lymphocytes Absolute: 2.3 10*3/uL (ref 0.7–3.1)
Lymphs: 25 %
MCH: 29.9 pg (ref 26.6–33.0)
MCHC: 33 g/dL (ref 31.5–35.7)
MCV: 91 fL (ref 79–97)
Monocytes Absolute: 0.8 10*3/uL (ref 0.1–0.9)
Monocytes: 9 %
Neutrophils Absolute: 5.6 10*3/uL (ref 1.4–7.0)
Neutrophils: 62 %
Platelets: 275 10*3/uL (ref 150–450)
RBC: 4.68 x10E6/uL (ref 3.77–5.28)
RDW: 14.1 % (ref 11.7–15.4)
WBC: 9.1 10*3/uL (ref 3.4–10.8)

## 2019-04-09 LAB — TSH: TSH: 13.5 u[IU]/mL — ABNORMAL HIGH (ref 0.450–4.500)

## 2019-04-09 LAB — VITAMIN D 25 HYDROXY (VIT D DEFICIENCY, FRACTURES): Vit D, 25-Hydroxy: 32.6 ng/mL (ref 30.0–100.0)

## 2019-04-09 NOTE — Patient Instructions (Signed)
Medication Instructions:  No medication changes today.  *If you need a refill on your cardiac medications before your next appointment, please call your pharmacy*  Lab Work: None today.  Testing/Procedures: Your cardiac catheterization looked good! No signs of blockages requiring stent. It was actually improved from 2013.  Follow-Up: At [facility], you and your health needs are our priority.  As part of our continuing mission to provide you with exceptional heart care, we have created designated Provider Care Teams.  These Care Teams include your primary Cardiologist (physician) and Advanced Practice Providers (APPs -  Physician Assistants and Nurse Practitioners) who all work together to provide you with the care you need, when you need it.  Your next appointment:   5 month(s)  The format for your next appointment:   In Person  Provider:    You may see Ida Rogue, MD or one of the following Advanced Practice Providers on your designated Care Team:    Murray Hodgkins, NP  Christell Faith, PA-C  Marrianne Mood, PA-C   Other Instructions   Follow up with primary care regarding your thyroid and potassium levels. This could be contributing to your fatigue.   Happy Anniversary!!

## 2019-04-09 NOTE — Progress Notes (Signed)
Office Visit    Patient Name: Tammy [name] Date of Encounter: 04/09/2019  Primary Care Provider:  [name], DO Primary Cardiologist:  Ida Rogue, MD Electrophysiologist:  None   Chief Complaint    Tammy [name] is a 74 y.o. female with a hx of CAD, PAD with angioplasty of legs bilterally, COPD, CKD, DMII, PE 12/2013, HTN, back surgery 03/2012 with rods, remote smoking history, obesity, breast CA, chronic back pain with stimulator   presents today for follow-up after cardiac catheterization  Past Medical History    Past Medical History:  Diagnosis Date  . Asthma   . Benign neoplasm of colon   . Cervical disc disease    "bulging" - no limitations per pt  . Chronic diastolic CHF (congestive heart failure) ([place])    a. 07/2012 Echo: Nl EF. mild inferoseptal HK, Gr 2 DD, mild conc LVH, mild PR/TR, mild PAH.  . CKD (chronic kidney disease), stage III   . COPD (chronic obstructive pulmonary disease) ([place])   . Coronary artery disease    a. 11/2011 Cath: LAD 82md, LCX min irregs, RCA 70p, 1071mith L->R collats, EF 60%-->Med Rx.  . Diabetes mellitus without complication ([place]  . Fusion of lumbar spine 12/22/2015  . GERD (gastroesophageal reflux disease)   . History of DVT (deep vein thrombosis)   . History of tobacco abuse    a. Quit 2011.  . Marland Kitchenyperlipidemia   . Hypertension   . Hypothyroidism   . Obesity   . Palpitations   . PE (pulmonary embolism) 10/15  . PONV (postoperative nausea and vomiting)   . Pulmonary embolus ([place]   RLL. Presented with SOB and elevated d-dimer.   . Marland KitchenVD (peripheral vascular disease) ([place]   a. PTA of right leg and left femoral artery with stenosis.  . Renal insufficiency   . [facility]   TIAs - no deficits   Past Surgical History:  Procedure Laterality Date  . ANGIOPLASTY / STENTING FEMORAL     PVD; angioplasty right leg and left femoral artery with stenosis.   . APPENDECTOMY  Oct. 2016  . BACK SURGERY  03/2012   nerve stimulator inserted  . BACK SURGERY     screws, rods replaced with new hardware.  . Marland KitchenACK SURGERY  11/2016  . BREAST SURGERY    . CARDIAC CATHETERIZATION  12/07/2011   Mid LAD 50%, distal LAD 50%, mid RCA 70%, distal RCA 100%   . CARDIAC CATHETERIZATION  01/04/2011   100% occluded mid RCA with good collaterals from distal LAD, normal LVEF.  . Marland KitchenHOLECYSTECTOMY    . COLONOSCOPY  2013  . COLONOSCOPY WITH PROPOFOL N/A 05/24/2015   Procedure: COLONOSCOPY WITH PROPOFOL;  Surgeon: DaLucilla LameMD;  [facility] Service: Endoscopy;  Laterality: N/A;  Diabetic - oral meds  . COLONOSCOPY WITH PROPOFOL N/A 01/28/2019   Procedure: COLONOSCOPY WITH PROPOFOL;  Surgeon: TaVirgel ManifoldMD;  Location: [facility] ENDOSCOPY;  Service: Endoscopy;  Laterality: N/A;  . DIAGNOSTIC LAPAROSCOPY    . ESOPHAGOGASTRODUODENOSCOPY (EGD) WITH PROPOFOL N/A 01/28/2019   Procedure: ESOPHAGOGASTRODUODENOSCOPY (EGD) WITH PROPOFOL;  Surgeon: TaVirgel ManifoldMD;  Location: [facility] ENDOSCOPY;  Service: Endoscopy;  Laterality: N/A;  . LAPAROSCOPIC APPENDECTOMY N/A 01/06/2015   Procedure: APPENDECTOMY LAPAROSCOPIC;  Surgeon: SeChristene LyeMD;  Location: [facility] ORS;  Service: General;  Laterality: N/A;  . LEFT HEART CATH AND CORONARY ANGIOGRAPHY Left 04/04/2019   Procedure: LEFT HEART CATH AND  CORONARY ANGIOGRAPHY;  Surgeon: [name], MD;  Location: [place] CV LAB;  Service: Cardiovascular;  Laterality: Left;  [name] MASTECTOMY    . MASTECTOMY W/ SENTINEL NODE BIOPSY Left 08/26/2018   Procedure: PARTIAL MASTECTOMY WIDE EXCISION WITH SENTINEL LYMPH NODE BIOPSY LEFT;  Surgeon: [name], MD;  Location: [facility] ORS;  Service: General;  Laterality: Left;  . POLYPECTOMY  05/24/2015   Procedure: POLYPECTOMY;  Surgeon: [name], MD;  Location: [place];  Service: Endoscopy;;  . THYROIDECTOMY    . TRIGGER FINGER RELEASE      Allergies  Allergies  Allergen Reactions  .  Hydrocodone Other (See Comments)    Unresponsive  . Aleve [Naproxen Sodium]     Hives & itching   . Contrast Media [Iodinated Diagnostic Agents]     Pt avoids due to kidney issues  . Ioxaglate Other (See Comments)    (iodine) Pt avoids due to kidney issues   . Oxycodone Other (See Comments)    hallucinations  . Ranexa [Ranolazine]     Sick on stomach  . Sulfa Antibiotics Other (See Comments)    Pt avoids due to kidney issues  . Tramadol Other (See Comments)    nightmares    History of Present Illness    Tammy [name] is a 74 y.o. female with a hx of CAD, PAD with angioplasty of legs bilterally, COPD, CKD, DMII, PE 12/2013, HTN, back surgery 03/2012 with rods, remote smoking history, obesity, breast CA, chronic back pain with stimulator   last seen for cardiac catheterization.  Cardiac cath 2013 with LAD 50%, prox RCA 70%, mid RCA 100%. Noted good collateral and recommended for medical management. Lexiscan myoview 03/28/18 with no significant ischemia, EF 73%, low risk scan.    03/15/2019 visit with PCP started on Augmentin for URI and prednisone for back pain.  03/31/2019 telephone encounter reporting chest pain since 03/28/2019.  Daughter reported some slurred speech.  Declined to go to ED  04/02/2019 office visit with me for follow-up of chest pain.  Reported fatigue for a number of months but worse over the last week.  Right leg pain and leg will "give way ".  Multiple falls over the last 2 weeks. Concern for neurologic component as daughter reported slurred speech the previous Saturday. Endorsed chest pain Friday and Saturday prior.  She was recommended cardiac catheterization as Lexiscan 04/01/2018 with low risk but she had continued symptoms.  She presents today for follow-up after cardiac catheterization.  Cardiac catheterization 04/04/2019 showed stable mild to moderate mid LAD disease and known occluded RCA with collaterals from left to right.  Per report imaging was actually improved  compared to prior study.    Seen by PCP 04/08/2019.  Labs were drawn showing K5.9, creatinine 1.54, GFR 33, hemoglobin 14, TSH 13.5.  She was provided with sucralfate for GI symptoms.  She underwent chest x-ray and lumbar spine x-ray 04/08/19.  Chest x-ray without acute findings.  Lumbar spine x-ray with no fracture or acute finding noted to be s/p L2-S1 posterior lumbar spine fusion with well-positioned, well-seated orthopedic hardware.  Presents today with her husband.  Tells me that is their anniversary and congratulated them.  She reports feeling well since cardiac catheterization.  Reports a few "twinges "of chest pain but nothing near as bad as previous.  She was reassured by her cardiac catheterization report.  We reviewed these results in detail.  She had not yet been informed of chest x-ray, back x-ray, lab  results by PCP yesterday.  Gave her preliminary report.  She was reassured by results of her x-rays.  EKGs/Labs/Other Studies Reviewed:   The following studies were reviewed today: Cardiac cath 04/04/19  Prox RCA to Mid RCA lesion is 100% stenosed.  Mid LAD lesion is 45% stenosed.  There is no aortic valve stenosis. There is no aortic valve regurgitation.   Final Conclusions:  Nonobstructive disease, stable mild to moderate mid LAD disease Known occluded RCA with collaterals from left to right Prior images reviewed from 2013, images today actually looks improved compared to prior study  Recommendations:  Etiology of anginal symptoms unclear, possibly small vessel disease We will look to advance her isosorbide, consider adding Ranexa No complications noted   EKG:  No EKG today.  Recent Labs: 04/08/2019: ALT 11; BUN 24; Creatinine, Ser 1.54; Hemoglobin 14.0; Platelets 275; Potassium 5.9; Sodium 140; TSH 13.500  Recent Lipid Panel    Component Value Date/Time   CHOL 138 01/02/2019 1034   CHOL 152 01/31/2016 1419   TRIG 248 (H) 01/02/2019 1034   TRIG 224 (H) 01/31/2016 1419    HDL 46 01/02/2019 1034   CHOLHDL 3.2 09/07/2015 0805   CHOLHDL 3.4 12/17/2014 1017   VLDL 45 (H) 01/31/2016 1419   LDLCALC 53 01/02/2019 1034    Home Medications   Current Meds  Medication Sig  . amoxicillin-clavulanate (AUGMENTIN) 875-125 MG tablet Take 1 tablet by mouth 2 (two) times daily.  [name] anastrozole (ARIMIDEX) 1 MG tablet TAKE ONE TABLET EVERY DAY (Patient taking differently: Take 1 mg by mouth daily. )  . apixaban (ELIQUIS) 2.5 MG TABS tablet Take 1 tablet (2.5 mg) by mouth twice daily (Patient taking differently: Take 2.5 mg by mouth 2 (two) times daily. )  . aspirin 81 MG chewable tablet Chew 81 mg by mouth once.  . blood glucose meter kit and supplies KIT Dispense based on patient and insurance preference. Use one time daily as directed, E11.9  . Calcium Carb-Cholecalciferol (CALCIUM 600 + D) 600-200 MG-UNIT TABS Take 1 tablet by mouth daily.   . cholecalciferol (VITAMIN D3) 25 MCG (1000 UT) tablet Take 1,000 Units by mouth daily.  . Dulaglutide (TRULICITY) 1.5 OJ/5.0KX SOPN Inject 1.5 mg into the skin every Tuesday.  . DULoxetine (CYMBALTA) 60 MG capsule TAKE 1 CAPSULE EVERY DAY (Patient taking differently: Take 60 mg by mouth daily. )  . gabapentin (NEURONTIN) 300 MG capsule TAKE 1 CAPSULE BY MOUTH THREE TIMES DAILY (Patient taking differently: Take 300-600 mg by mouth See admin instructions. Take 1 capsule (300 mg) by mouth in the morning & take 2 capsules (600 mg) by mouth at night.)  . glipiZIDE (GLUCOTROL) 5 MG tablet TAKE ONE TABLET EVERY DAY BEFORE BREAKFAST (Patient taking differently: Take 5 mg by mouth daily before breakfast. TAKE ONE TABLET EVERY DAY BEFORE BREAKFAST)  . isosorbide mononitrate (IMDUR) 60 MG 24 hr tablet TAKE 1 TABLET BY MOUTH AT BEDTIME AND 1/2 TABLET IN THE MORNING (Patient taking differently: Take 30-60 mg by mouth See admin instructions. Take 0.5 tablet (30 mg) by mouth in the morning & take 1 capsule (60 mg) by mouth at night.)  . JARDIANCE 25 MG  TABS tablet TAKE ONE TABLET 25MG EVERY DAY BEFORE BREAKFAST (Patient taking differently: Take 25 mg by mouth daily. )  . levothyroxine (SYNTHROID) 125 MCG tablet Take 1 tablet (125 mcg total) by mouth daily before breakfast.  . losartan (COZAAR) 25 MG tablet Take 1 tablet (25 mg total) by mouth daily.  [name]  metoprolol succinate (TOPROL-XL) 50 MG 24 hr tablet TAKE 1 TABLET BY MOUTH DAILY WITH OR IMMEDIATLEY FOLLOWING A MEAL (Patient taking differently: Take 50 mg by mouth daily after supper. )  . Multiple Vitamins-Minerals (ZINC PO) Take 50 mg by mouth daily.   . nitroGLYCERIN (NITROSTAT) 0.4 MG SL tablet Place 1 tablet (0.4 mg total) under the tongue every 5 (five) minutes as needed for chest pain.  . pantoprazole (PROTONIX) 40 MG tablet TAKE 1 TABLET BY MOUTH DAILY AS NEEDED (Patient taking differently: Take 40 mg by mouth at bedtime. )  . polyethylene glycol (MIRALAX MIX-IN PAX) 17 g packet Take 17 g by mouth daily.  . Polyethylene Glycol 400 (BLINK TEARS) 0.25 % SOLN Place 1 drop into both eyes 3 (three) times daily as needed (dry/irritated eyes.).  [name] predniSONE (DELTASONE) 50 MG tablet Take 1 tablet (50 mg total) by mouth daily with breakfast.  . pregabalin (LYRICA) 150 MG capsule Take 1 capsule (150 mg total) by mouth 2 (two) times daily.  . pregabalin (LYRICA) 75 MG capsule Take 150 mg by mouth at bedtime.  [name] PROAIR HFA 108 (90 Base) MCG/ACT inhaler TAKE 2 PUFFS EVERY 4 HOURS AS NEEDED FORWHEEZING OR SHORTNESS OF BREATH (Patient taking differently: Inhale 1-2 puffs into the lungs every 4 (four) hours as needed (wheezing/shortness of breath.). )  . rosuvastatin (CRESTOR) 40 MG tablet TAKE 1 TABLET BY MOUTH DAILY (Patient taking differently: Take 40 mg by mouth daily. )  . sucralfate (CARAFATE) 1 g tablet Take 1 tablet (1 g total) by mouth 4 (four) times daily -  with meals and at bedtime.  [name] trimethoprim (TRIMPEX) 100 MG tablet TAKE 1 TABLET BY MOUTH DAILY (Patient taking differently: Take 100 mg by  mouth at bedtime. )    Review of Systems    Review of Systems  Constitution: Positive for malaise/fatigue. Negative for chills and fever.  Cardiovascular: Negative for chest pain, dyspnea on exertion, leg swelling, near-syncope, orthopnea, palpitations and syncope.  Respiratory: Negative for cough, shortness of breath and wheezing.   Musculoskeletal: Positive for back pain.  Gastrointestinal: Negative for nausea and vomiting.  Neurological: Negative for dizziness, light-headedness and weakness.   All other systems reviewed and are otherwise negative except as noted above.  Physical Exam    VS:  BP 130/62 (BP Location: Right Arm, Patient Position: Sitting, Cuff Size: Normal)   Pulse 80   Ht 5' 6"  (1.676 m)   Wt 205 lb (93 kg)   SpO2 93%   BMI 33.09 kg/m  , BMI Body mass index is 33.09 kg/m. GEN: Well nourished, overweight, well developed, in no acute distress. HEENT: normal. Neck: Supple, no JVD, carotid bruits, or masses. Cardiac: RRR, no murmurs, rubs, or gallops. No clubbing, cyanosis, edema.  Radials/PT 2+ and equal bilaterally.  Respiratory:  Respirations regular and unlabored, clear to auscultation bilaterally. GI: Soft, nontender, nondistended. MS: No deformity or atrophy. Skin: Warm and dry, no rash. R groin cath site healing appropriately. Clean, dry, intact. 0.25"x0.25" area of ecchymosis. No hematoma. No erythema, no signs of infection. Neuro:  Strength and sensation are intact. Psych: Normal affect.   Assessment & Plan    1. CAD - s/p cardiac cath 04/08/19 with stable chronic occlusion of RCA with good collateral and moderate nonobstructive disease of the LAD. GDMT includes aspirin, beta blocker, statin, PRN nitroglycerin, Imdur. Could consider increasing Imdur dose or adding Ranexa if she has recurrent symptoms. She reports symptoms have been well controlled since cath and  prefers to remain on her current regimen.   2. Hyperkalemia - Noted on labs from PCP yesterday  with K 5.9. She has had elevated K in the past which self-resolved. Likely due to her recent GI upset and CKD. She takes no potassium supplementation. Encouraged to follow up with PCP.   3. Fatigue- Labs yesterday with PCP show elevated TSH and low normal vitamin D. Takes OTC Vitamin D. On levothyroxine post thyroid removal. These findings may contribute to her fatigue. Encouraged to follow up with PCP.   4. HTN - BP well controlled. Continue present antihypertensive regimen.   5. DM2 - Follows with PCP.   6. HLD - Lipid panel 01/02/19 LDL 53. LDL at goal of <70. Continue Rosuvastatin.  7. Hx DVT/PE - On Xarelto 2.13m BID for continued protection. Denies bleeding complications.   8. CKDIII - Follows with nephrology. BMET yesterday with GFR 33, stable compared to previous.  9. Chronic back pain- Longstanding history. Worse since fall in December. [facility] finding.   Disposition: Follow up in 5 month(s) with Dr. GWallie Renshaw NP 04/09/2019, 2:05 PM

## 2019-04-11 ENCOUNTER — Telehealth: Payer: Self-pay | Admitting: Family Medicine

## 2019-04-11 DIAGNOSIS — E039 Hypothyroidism, unspecified: Secondary | ICD-10-CM

## 2019-04-11 MED ORDER — LEVOTHYROXINE SODIUM 150 MCG PO TABS: 150.0000 ug | ORAL_TABLET | Freq: Every day | ORAL | 2 refills | Status: DC

## 2019-04-11 NOTE — Telephone Encounter (Signed)
Please let her know that her labs generally look good, but her thyroid is under treated. I'd like her to increase her thyroid medicine to 187mcg and I'll recheck it in 6 weeks (order in). Everything else looks stable.   Her x-ray of her back shows no fracture- it looks stable, so it's probably a deep bruise. Her chest x-ray was nice and normal.   Thanks!

## 2019-04-11 NOTE — Telephone Encounter (Signed)
Patient notified

## 2019-04-12 ENCOUNTER — Encounter: Payer: Self-pay | Admitting: Family Medicine

## 2019-04-12 NOTE — Assessment & Plan Note (Signed)
Following with cardiology. Will keep BP, sugars and cholesterol under good control. Call with any concerns.

## 2019-04-12 NOTE — Assessment & Plan Note (Signed)
Rechecking levels today. Await results. Treat as needed.  

## 2019-04-12 NOTE — Assessment & Plan Note (Signed)
Continue current regimen. Continue to monitor. Refills given today.

## 2019-04-12 NOTE — Assessment & Plan Note (Signed)
Under good control on current regimen. Continue current regimen. Continue to monitor. Call with any concerns. Refills given. Labs drawn today.   

## 2019-04-12 NOTE — Assessment & Plan Note (Signed)
Will go get her x-ray. Await results. Call with any concerns.

## 2019-04-12 NOTE — Assessment & Plan Note (Signed)
Will keep BP, sugars and cholesterol under good control. Call with any concerns.

## 2019-04-12 NOTE — Assessment & Plan Note (Signed)
Doing better with A1c of 8.3. Continue diet and exercise and current regimen. Continue to monitor. Recheck 3 months.

## 2019-04-17 ENCOUNTER — Other Ambulatory Visit: Payer: Self-pay | Admitting: Family Medicine

## 2019-04-29 DIAGNOSIS — M170 Bilateral primary osteoarthritis of knee: Secondary | ICD-10-CM | POA: Diagnosis not present

## 2019-05-07 ENCOUNTER — Telehealth: Payer: Self-pay | Admitting: Family Medicine

## 2019-05-07 NOTE — Telephone Encounter (Signed)
Did she get a steroid shot recently? Do NOT take 2 jardiance, but she can continue 2 a day glipizide for the next couple of days. If the sugars are not coming down, will need appointment.

## 2019-05-07 NOTE — Telephone Encounter (Signed)
Spoke with pt and she stated that she checked her blood sugar and it was 320. She stated that she took an extra glipizine and jardiance and checked it an hour later, which was 30 minutes ago and now it is 393. Please advice.

## 2019-05-07 NOTE — Telephone Encounter (Signed)
Pt stated she did get a Cortizone shot in both knees, 04/29/2019.   Sugar is currently 358, Pt asked what to do if Sugars go above 400?

## 2019-05-07 NOTE — Telephone Encounter (Signed)
Pt aware of Dr. Durenda Age advice.

## 2019-05-07 NOTE — Telephone Encounter (Signed)
Drink a lot of water and call us.

## 2019-05-08 ENCOUNTER — Ambulatory Visit (INDEPENDENT_AMBULATORY_CARE_PROVIDER_SITE_OTHER): Payer: Medicare Other | Admitting: Family Medicine

## 2019-05-08 ENCOUNTER — Other Ambulatory Visit: Payer: Self-pay

## 2019-05-08 ENCOUNTER — Encounter: Payer: Self-pay | Admitting: Family Medicine

## 2019-05-08 VITALS — BP 121/72 | HR 76 | Temp 97.6°F

## 2019-05-08 DIAGNOSIS — IMO0002 Reserved for concepts with insufficient information to code with codable children: Secondary | ICD-10-CM

## 2019-05-08 DIAGNOSIS — G8929 Other chronic pain: Secondary | ICD-10-CM | POA: Diagnosis not present

## 2019-05-08 DIAGNOSIS — E118 Type 2 diabetes mellitus with unspecified complications: Secondary | ICD-10-CM | POA: Diagnosis not present

## 2019-05-08 DIAGNOSIS — I200 Unstable angina: Secondary | ICD-10-CM

## 2019-05-08 DIAGNOSIS — E1165 Type 2 diabetes mellitus with hyperglycemia: Secondary | ICD-10-CM

## 2019-05-08 DIAGNOSIS — R1031 Right lower quadrant pain: Secondary | ICD-10-CM | POA: Diagnosis not present

## 2019-05-08 DIAGNOSIS — E875 Hyperkalemia: Secondary | ICD-10-CM | POA: Diagnosis not present

## 2019-05-08 MED ORDER — TRULICITY 3 MG/0.5ML ~~LOC~~ SOAJ: 3.0000 mg | SUBCUTANEOUS | 6 refills | Status: DC

## 2019-05-08 NOTE — Progress Notes (Signed)
BP 121/72 (BP Location: Left Arm, Patient Position: Sitting, Cuff Size: Normal)   Pulse 76   Temp 97.6 F (36.4 C) (Oral)   SpO2 95%    Subjective:    Patient ID: Tammy [name], female    DOB: Jun 22, 1945, 74 y.o.   MRN: [id number]  HPI: Tammy [name] is a 74 y.o. female  No chief complaint on file.  Doing OK today. Had 2 cortisone shots in her knees. Sugars were running high. Sugars back to normal today. Chest pain has resolved. No more issues. Was hyperkalemic at her cardiologists about a month ago. No other big issues  DIABETES Hypoglycemic episodes:no Polydipsia/polyuria: no Visual disturbance: no Chest pain: no Paresthesias: no Glucose Monitoring: yes  Accucheck frequency: a few times a day Taking Insulin?: no Blood Pressure Monitoring: not checking Retinal Examination: Not up to Date Foot Exam: Up to Date Diabetic Education: Completed Pneumovax: Up to Date Influenza: Up to Date Aspirin: yes  Was not happy with her care during her colonoscopy. Due for a repeat colonoscopy in the summer and doesn't want to go back to see them. Would like to see someone else for her repeat.   Relevant past medical, surgical, family and social history reviewed and updated as indicated. Interim medical history since our last visit reviewed. Allergies and medications reviewed and updated.  Review of Systems  Constitutional: Negative.   Respiratory: Negative.   Cardiovascular: Negative.   Musculoskeletal: Positive for arthralgias, back pain and myalgias. Negative for gait problem, joint swelling, neck pain and neck stiffness.  Skin: Negative.   Neurological: Negative.   Psychiatric/Behavioral: Negative.     Per HPI unless specifically indicated above     Objective:    BP 121/72 (BP Location: Left Arm, Patient Position: Sitting, Cuff Size: Normal)   Pulse 76   Temp 97.6 F (36.4 C) (Oral)   SpO2 95%   Wt Readings from Last 3 Encounters:  04/09/19 205 lb (93 kg)  04/04/19 205 lb  (93 kg)  04/02/19 205 lb (93 kg)    Physical Exam Vitals and nursing note reviewed.  Constitutional:      General: She is not in acute distress.    Appearance: Normal appearance. She is not ill-appearing, toxic-appearing or diaphoretic.  HENT:     Head: Normocephalic and atraumatic.     Right Ear: External ear normal.     Left Ear: External ear normal.     Nose: Nose normal.     Mouth/Throat:     Mouth: Mucous membranes are moist.     Pharynx: Oropharynx is clear.  Eyes:     General: No scleral icterus.       Right eye: No discharge.        Left eye: No discharge.     Extraocular Movements: Extraocular movements intact.     Conjunctiva/sclera: Conjunctivae normal.     Pupils: Pupils are equal, round, and reactive to light.  Cardiovascular:     Rate and Rhythm: Normal rate and regular rhythm.     Pulses: Normal pulses.     Heart sounds: Normal heart sounds. No murmur. No friction rub. No gallop.   Pulmonary:     Effort: Pulmonary effort is normal. No respiratory distress.     Breath sounds: Normal breath sounds. No stridor. No wheezing, rhonchi or rales.  Chest:     Chest wall: No tenderness.  Musculoskeletal:        General: Normal range of motion.  Cervical back: Normal range of motion and neck supple.  Skin:    General: Skin is warm and dry.     Capillary Refill: Capillary refill takes less than 2 seconds.     Coloration: Skin is not jaundiced or pale.     Findings: No bruising, erythema, lesion or rash.  Neurological:     General: No focal deficit present.     Mental Status: She is alert and oriented to person, place, and time. Mental status is at baseline.  Psychiatric:        Mood and Affect: Mood normal.        Behavior: Behavior normal.        Thought Content: Thought content normal.        Judgment: Judgment normal.     Results for orders placed or performed in visit on 04/08/19  CBC with Differential/Platelet  Result Value Ref Range   WBC 9.1 3.4 -  10.8 x10E3/uL   RBC 4.68 3.77 - 5.28 x10E6/uL   Hemoglobin 14.0 11.1 - 15.9 g/dL   Hematocrit 42.4 34.0 - 46.6 %   MCV 91 79 - 97 fL   MCH 29.9 26.6 - 33.0 pg   MCHC 33.0 31.5 - 35.7 g/dL   RDW 14.1 11.7 - 15.4 %   Platelets 275 150 - 450 x10E3/uL   Neutrophils 62 Not Estab. %   Lymphs 25 Not Estab. %   Monocytes 9 Not Estab. %   Eos 2 Not Estab. %   Basos 1 Not Estab. %   Neutrophils Absolute 5.6 1.4 - 7.0 x10E3/uL   Lymphocytes Absolute 2.3 0.7 - 3.1 x10E3/uL   Monocytes Absolute 0.8 0.1 - 0.9 x10E3/uL   EOS (ABSOLUTE) 0.2 0.0 - 0.4 x10E3/uL   Basophils Absolute 0.1 0.0 - 0.2 x10E3/uL   Immature Granulocytes 1 Not Estab. %   Immature Grans (Abs) 0.1 0.0 - 0.1 x10E3/uL  Comprehensive metabolic panel  Result Value Ref Range   Glucose 187 (H) 65 - 99 mg/dL   BUN 24 8 - 27 mg/dL   Creatinine, Ser 1.54 (H) 0.57 - 1.00 mg/dL   GFR calc non Af Amer 33 (L) >59 mL/min/1.73   GFR calc Af Amer 38 (L) >59 mL/min/1.73   BUN/Creatinine Ratio 16 12 - 28   Sodium 140 134 - 144 mmol/L   Potassium 5.9 (H) 3.5 - 5.2 mmol/L   Chloride 101 96 - 106 mmol/L   CO2 26 20 - 29 mmol/L   Calcium 9.7 8.7 - 10.3 mg/dL   Total Protein 7.1 6.0 - 8.5 g/dL   Albumin 4.2 3.7 - 4.7 g/dL   Globulin, Total 2.9 1.5 - 4.5 g/dL   Albumin/Globulin Ratio 1.4 1.2 - 2.2   Bilirubin Total 0.5 0.0 - 1.2 mg/dL   Alkaline Phosphatase 93 39 - 117 IU/L   AST 15 0 - 40 IU/L   ALT 11 0 - 32 IU/L  TSH  Result Value Ref Range   TSH 13.500 (H) 0.450 - 4.500 uIU/mL  VITAMIN D 25 Hydroxy (Vit-D Deficiency, Fractures)  Result Value Ref Range   Vit D, 25-Hydroxy 32.6 30.0 - 100.0 ng/mL  Bayer DCA Hb A1c Waived  Result Value Ref Range   HB A1C (BAYER DCA - WAIVED) 8.3 (H) <7.0 %      Assessment & Plan:   Problem List Items Addressed This Visit      Endocrine   Uncontrolled type 2 diabetes mellitus with complication, without long-term current use of insulin (  [place]) - Primary    Not doing well, sugars up the past couple  of days due to cortisone shots in her knees. Will increase her trulicity to 3mg  and recheck 2 months. Call with any concerns.       Relevant Medications   Dulaglutide (TRULICITY) 3 QZ/0.0PQ SOPN    Other Visit Diagnoses    Hyperkalemia       Rechecking labs today. Await results.    Relevant Orders   Basic metabolic panel   Chronic RLQ pain       Needs repeat colonoscopy, does not want to go back to [facility]- referral to [facility] made today. Call with any concerns.    Relevant Orders   Ambulatory referral to Gastroenterology       Follow up plan: Return in about 2 months (around 07/06/2019).

## 2019-05-08 NOTE — Assessment & Plan Note (Signed)
Not doing well, sugars up the past couple of days due to cortisone shots in her knees. Will increase her trulicity to 3mg  and recheck 2 months. Call with any concerns.

## 2019-05-09 ENCOUNTER — Other Ambulatory Visit: Payer: Self-pay | Admitting: Family Medicine

## 2019-05-09 DIAGNOSIS — N289 Disorder of kidney and ureter, unspecified: Secondary | ICD-10-CM

## 2019-05-09 LAB — BASIC METABOLIC PANEL
BUN/Creatinine Ratio: 15 (ref 12–28)
BUN: 28 mg/dL — ABNORMAL HIGH (ref 8–27)
CO2: 23 mmol/L (ref 20–29)
Calcium: 8.9 mg/dL (ref 8.7–10.3)
Chloride: 100 mmol/L (ref 96–106)
Creatinine, Ser: 1.86 mg/dL — ABNORMAL HIGH (ref 0.57–1.00)
GFR calc Af Amer: 30 mL/min/{1.73_m2} — ABNORMAL LOW (ref >59)
GFR calc non Af Amer: 26 mL/min/{1.73_m2} — ABNORMAL LOW (ref >59)
Glucose: 280 mg/dL — ABNORMAL HIGH (ref 65–99)
Potassium: 5.4 mmol/L — ABNORMAL HIGH (ref 3.5–5.2)
Sodium: 139 mmol/L (ref 134–144)

## 2019-05-16 ENCOUNTER — Other Ambulatory Visit: Payer: Self-pay | Admitting: Urology

## 2019-05-16 ENCOUNTER — Other Ambulatory Visit: Payer: Self-pay | Admitting: Family Medicine

## 2019-05-16 MED ORDER — PREGABALIN 150 MG PO CAPS: 150.0000 mg | ORAL_CAPSULE | Freq: Two times a day (BID) | ORAL | 5 refills | Status: DC

## 2019-05-16 NOTE — Telephone Encounter (Signed)
She should be taking the 150mg 

## 2019-05-16 NOTE — Telephone Encounter (Signed)
Routing to provider  

## 2019-05-16 NOTE — Telephone Encounter (Signed)
Patient is taking the 150mg  please send to pharmacy

## 2019-05-16 NOTE — Telephone Encounter (Signed)
Requested medication (s) are due for refill today: yes  Requested medication (s) are on the active medication list: yes  Last refill:  last refilled by historical provider  Future visit scheduled: yes  Notes to clinic:  Refill not delegated per protocol. Last filled by historical provider    Requested Prescriptions  Pending Prescriptions Disp Refills   pregabalin (LYRICA) 75 MG capsule [Pharmacy Med Name: PREGABALIN 75 MG CAP] 120 capsule     Sig: 1 CAPSULE AT BEDTIME FOR 1 WEEK, THEN 1 TWICE A DAY FOR 1 WEEK. IF NOT TOO SLEEPY, TAKE 2 AT BEDTIME AND 1 DURING DAY  X 1 WEEK, THEN 2 TWICE A DAY      Not Delegated - Neurology:  Anticonvulsants - Controlled Failed - 05/16/2019  1:24 PM      Failed - This refill cannot be delegated      Passed - Valid encounter within last 12 months    Recent Outpatient Visits           1 week ago Uncontrolled type 2 diabetes mellitus with complication, without long-term current use of insulin ([place])   [place], Megan P, DO   1 month ago Chest pain, unspecified type   [place], Megan P, DO   1 month ago Acute non-recurrent maxillary sinusitis   [facility] [place], Megan P, DO   3 months ago Uncontrolled type 2 diabetes mellitus with complication, without long-term current use of insulin ([place])   [place], Megan P, DO   4 months ago Uncontrolled type 2 diabetes mellitus with complication, without long-term current use of insulin ([place])   [place], [place], DO       Future Appointments             In 1 month Johnson, Barb Merino, DO [employer], [place]   In 3 months Gollan, [name], MD [place], [facility]

## 2019-05-20 ENCOUNTER — Other Ambulatory Visit: Payer: Medicare Other

## 2019-05-20 ENCOUNTER — Other Ambulatory Visit: Payer: Self-pay

## 2019-05-20 DIAGNOSIS — E039 Hypothyroidism, unspecified: Secondary | ICD-10-CM

## 2019-05-20 DIAGNOSIS — N289 Disorder of kidney and ureter, unspecified: Secondary | ICD-10-CM | POA: Diagnosis not present

## 2019-05-21 ENCOUNTER — Telehealth: Payer: Self-pay | Admitting: Family Medicine

## 2019-05-21 LAB — BASIC METABOLIC PANEL WITH GFR
BUN/Creatinine Ratio: 18 (ref 12–28)
BUN: 30 mg/dL — ABNORMAL HIGH (ref 8–27)
CO2: 24 mmol/L (ref 20–29)
Calcium: 9.1 mg/dL (ref 8.7–10.3)
Chloride: 103 mmol/L (ref 96–106)
Creatinine, Ser: 1.68 mg/dL — ABNORMAL HIGH (ref 0.57–1.00)
GFR calc Af Amer: 34 mL/min/1.73 — ABNORMAL LOW
GFR calc non Af Amer: 30 mL/min/1.73 — ABNORMAL LOW
Glucose: 276 mg/dL — ABNORMAL HIGH (ref 65–99)
Potassium: 4.8 mmol/L (ref 3.5–5.2)
Sodium: 142 mmol/L (ref 134–144)

## 2019-05-21 LAB — TSH: TSH: 4.68 u[IU]/mL — ABNORMAL HIGH (ref 0.450–4.500)

## 2019-05-21 NOTE — Chronic Care Management (AMB) (Signed)
  Care Management   Note  05/21/2019 Name: Tammy [name] MRN: [id number] DOB: 04-09-1945  Tammy [name] is a 74 y.o. year old female who is a primary care patient of [name], DO and is actively engaged with the care management team. I reached out to Tammy [name] by phone today to assist with scheduling a follow up visit with the Pharmacist  Follow up plan: Telephone appointment with care management team member scheduled for:08/05/2019  Noreene Larsson, [place], [place], [place] [address] Direct Dial: [phone] [email or web address] Website: [email or web address]

## 2019-05-24 ENCOUNTER — Other Ambulatory Visit: Payer: Self-pay | Admitting: Family Medicine

## 2019-05-24 ENCOUNTER — Other Ambulatory Visit: Payer: Self-pay | Admitting: Urology

## 2019-05-24 NOTE — Telephone Encounter (Signed)
Requested Prescriptions  Pending Prescriptions Disp Refills  . DULoxetine (CYMBALTA) 60 MG capsule [Pharmacy Med Name: DULOXETINE HCL 60 MG CAP] 90 capsule 1    Sig: TAKE 1 CAPSULE BY MOUTH ONCE DAILY     Psychiatry: Antidepressants - SNRI Passed - 05/24/2019 10:42 AM      Passed - Last BP in normal range    BP Readings from Last 1 Encounters:  05/08/19 121/72         Passed - Valid encounter within last 6 months    Recent Outpatient Visits          2 weeks ago Uncontrolled type 2 diabetes mellitus with complication, without long-term current use of insulin ([place])   [place], Megan P, DO   1 month ago Chest pain, unspecified type   [facility] [place], Megan P, DO   2 months ago Acute non-recurrent maxillary sinusitis   [facility] [place], Megan P, DO   3 months ago Uncontrolled type 2 diabetes mellitus with complication, without long-term current use of insulin ([place])   [place], Megan P, DO   4 months ago Uncontrolled type 2 diabetes mellitus with complication, without long-term current use of insulin ([place])   [place], Clarinda, DO      Future Appointments            In 1 month Johnson, Barb Merino, DO [employer], [place]   In 3 months Gollan, [name], MD [place], [place]

## 2019-05-26 ENCOUNTER — Telehealth: Payer: Self-pay | Admitting: Family Medicine

## 2019-05-26 ENCOUNTER — Ambulatory Visit
Admission: RE | Admit: 2019-05-26 | Discharge: 2019-05-26 | Disposition: A | Discharge status: Home / Self Care | Payer: Medicare Other | Source: Ambulatory Visit | Attending: Internal Medicine | Admitting: Internal Medicine

## 2019-05-26 DIAGNOSIS — C50312 Malignant neoplasm of lower-inner quadrant of left female breast: Secondary | ICD-10-CM | POA: Insufficient documentation

## 2019-05-26 DIAGNOSIS — Z79811 Long term (current) use of aromatase inhibitors: Secondary | ICD-10-CM | POA: Insufficient documentation

## 2019-05-26 DIAGNOSIS — M85851 Other specified disorders of bone density and structure, right thigh: Secondary | ICD-10-CM | POA: Diagnosis not present

## 2019-05-26 DIAGNOSIS — Z170 Estrogen receptor positive status [ER+]: Secondary | ICD-10-CM | POA: Diagnosis not present

## 2019-05-26 DIAGNOSIS — Z780 Asymptomatic menopausal state: Secondary | ICD-10-CM | POA: Diagnosis not present

## 2019-05-26 NOTE — Telephone Encounter (Signed)
Called pt because she was on lab schedule, she states that she has had a runny nose that will not stop for months now and wants to know if provider can send her in something. Please advise.

## 2019-05-27 ENCOUNTER — Telehealth (INDEPENDENT_AMBULATORY_CARE_PROVIDER_SITE_OTHER): Payer: Medicare Other | Admitting: Family Medicine

## 2019-05-27 ENCOUNTER — Encounter: Payer: Self-pay | Admitting: Family Medicine

## 2019-05-27 ENCOUNTER — Other Ambulatory Visit: Payer: Self-pay

## 2019-05-27 DIAGNOSIS — J301 Allergic rhinitis due to pollen: Secondary | ICD-10-CM | POA: Diagnosis not present

## 2019-05-27 DIAGNOSIS — N179 Acute kidney failure, unspecified: Secondary | ICD-10-CM | POA: Diagnosis not present

## 2019-05-27 DIAGNOSIS — I200 Unstable angina: Secondary | ICD-10-CM | POA: Diagnosis not present

## 2019-05-27 MED ORDER — CETIRIZINE HCL 10 MG PO TABS: 10.0000 mg | ORAL_TABLET | Freq: Every day | ORAL | 11 refills | Status: DC

## 2019-05-27 MED ORDER — OLOPATADINE HCL 0.1 % OP SOLN: 1.0000 [drp] | Freq: Two times a day (BID) | OPHTHALMIC | 12 refills | Status: DC

## 2019-05-27 MED ORDER — TRIMETHOPRIM 100 MG PO TABS: 100.0000 mg | ORAL_TABLET | Freq: Every day | ORAL | 1 refills | Status: DC

## 2019-05-27 MED ORDER — MONTELUKAST SODIUM 10 MG PO TABS: 10.0000 mg | ORAL_TABLET | Freq: Every day | ORAL | 3 refills | Status: DC

## 2019-05-27 NOTE — Telephone Encounter (Signed)
appointment

## 2019-05-27 NOTE — Telephone Encounter (Signed)
Appt scheduled

## 2019-05-27 NOTE — Progress Notes (Signed)
There were no vitals taken for this visit.   Subjective:    Patient ID: Tammy [name], female    DOB: 08-14-1945, 74 y.o.   MRN: [id number]  HPI: Tammy [name] is a 74 y.o. female  Chief Complaint  Patient presents with  . Nasal Congestion    X 2 months  . watery eye    Right   UPPER RESPIRATORY TRACT INFECTION Duration: 2 months Worst symptom: runny nose and tearing eye Fever: no Cough: no Shortness of breath: no Wheezing: no Chest pain: no Chest tightness: no Chest congestion: no Nasal congestion: yes Runny nose: yes Post nasal drip: yes Sneezing: yes Sore throat: no Swollen glands: no Sinus pressure: no Headache: no Face pain: no Toothache: yes Ear pain: no  Ear pressure: no  Eyes red/itching:yes Eye drainage/crusting: yes  Vomiting: no Rash: no Fatigue: no Sick contacts: no Strep contacts: no  Context: stable Recurrent sinusitis: no Relief with OTC cold/cough medications: no  Treatments attempted: none   Relevant past medical, surgical, family and social history reviewed and updated as indicated. Interim medical history since our last visit reviewed. Allergies and medications reviewed and updated.  Review of Systems  Constitutional: Negative.   HENT: Positive for congestion, postnasal drip, rhinorrhea and sneezing. Negative for dental problem, drooling, ear discharge, ear pain, facial swelling, hearing loss, mouth sores, nosebleeds, sinus pressure, sinus pain, sore throat, tinnitus, trouble swallowing and voice change.   Respiratory: Negative.   Cardiovascular: Negative.     Per HPI unless specifically indicated above     Objective:    There were no vitals taken for this visit.  Wt Readings from Last 3 Encounters:  04/09/19 205 lb (93 kg)  04/04/19 205 lb (93 kg)  04/02/19 205 lb (93 kg)    Physical Exam Vitals and nursing note reviewed.  Constitutional:      General: She is not in acute distress.    Appearance: Normal appearance. She is not  ill-appearing, toxic-appearing or diaphoretic.  HENT:     Head: Normocephalic and atraumatic.     Right Ear: External ear normal.     Left Ear: External ear normal.     Nose: Nose normal.     Mouth/Throat:     Mouth: Mucous membranes are moist.     Pharynx: Oropharynx is clear.  Eyes:     General: No scleral icterus.       Right eye: No discharge.        Left eye: No discharge.     Conjunctiva/sclera: Conjunctivae normal.     Pupils: Pupils are equal, round, and reactive to light.  Pulmonary:     Effort: Pulmonary effort is normal. No respiratory distress.     Comments: Speaking in full sentences Musculoskeletal:        General: Normal range of motion.     Cervical back: Normal range of motion.  Skin:    Coloration: Skin is not jaundiced or pale.     Findings: No bruising, erythema, lesion or rash.  Neurological:     Mental Status: She is alert and oriented to person, place, and time. Mental status is at baseline.  Psychiatric:        Mood and Affect: Mood normal.        Behavior: Behavior normal.        Thought Content: Thought content normal.        Judgment: Judgment normal.     Results for orders placed or performed  in visit on 02/40/97  Basic metabolic panel  Result Value Ref Range   Glucose 276 (H) 65 - 99 mg/dL   BUN 30 (H) 8 - 27 mg/dL   Creatinine, Ser 1.68 (H) 0.57 - 1.00 mg/dL   GFR calc non Af Amer 30 (L) >59 mL/min/1.73   GFR calc Af Amer 34 (L) >59 mL/min/1.73   BUN/Creatinine Ratio 18 12 - 28   Sodium 142 134 - 144 mmol/L   Potassium 4.8 3.5 - 5.2 mmol/L   Chloride 103 96 - 106 mmol/L   CO2 24 20 - 29 mmol/L   Calcium 9.1 8.7 - 10.3 mg/dL  TSH  Result Value Ref Range   TSH 4.680 (H) 0.450 - 4.500 uIU/mL      Assessment & Plan:   Problem List Items Addressed This Visit      Respiratory   Rhinitis, allergic - Primary    Will treat with singulair, pataday and ztyrec. Call with any concerns or if not getting better.        Other Visit  Diagnoses    AKI (acute kidney injury) ([place])       Will return next week for recheck on her labs. Discussed results today.   Relevant Orders   Basic metabolic panel       Follow up plan: Return in about 6 weeks (around 07/08/2019).    . This visit was completed via MyChart due to the restrictions of the COVID-19 pandemic. All issues as above were discussed and addressed. Physical exam was done as above through visual confirmation on MyChart. If it was felt that the patient should be evaluated in the office, they were directed there. The patient verbally consented to this visit. . Location of the patient: home . Location of the provider: work . Those involved with this call:  . Provider: Park Liter, DO . CMA: Tiffany Reel, CMA . Front Desk/Registration: Don Perking  . Time spent on call: 15 minutes with patient face to face via video conference. More than 50% of this time was spent in counseling and coordination of care. 23 minutes total spent in review of patient's record and preparation of their chart.

## 2019-05-27 NOTE — Assessment & Plan Note (Signed)
Will treat with singulair, pataday and ztyrec. Call with any concerns or if not getting better.

## 2019-06-02 ENCOUNTER — Other Ambulatory Visit: Payer: Self-pay

## 2019-06-02 NOTE — Progress Notes (Signed)
Patient would like Dr. Rogue Bussing to review her bone density results with her. She is tolerating her arimidex well. She declines any need for RFs at this time.

## 2019-06-03 ENCOUNTER — Inpatient Hospital Stay: Payer: Medicare Other | Attending: Internal Medicine | Admitting: Internal Medicine

## 2019-06-03 DIAGNOSIS — I200 Unstable angina: Secondary | ICD-10-CM | POA: Diagnosis not present

## 2019-06-03 DIAGNOSIS — Z170 Estrogen receptor positive status [ER+]: Secondary | ICD-10-CM

## 2019-06-03 DIAGNOSIS — C50312 Malignant neoplasm of lower-inner quadrant of left female breast: Secondary | ICD-10-CM

## 2019-06-03 NOTE — Progress Notes (Signed)
I connected with Tammy [name] on 06/03/19 at  2:30 PM EDT by video enabled telemedicine visit and verified that I am speaking with the correct person using two identifiers.  I discussed the limitations, risks, security and privacy concerns of performing an evaluation and management service by telemedicine and the availability of in-person appointments. I also discussed with the patient that there may be a patient responsible charge related to this service. The patient expressed understanding and agreed to proceed.    Other persons participating in the visit and their role in the encounter: RN/medical reconciliation Patient's location: Home Provider's location: Office  Oncology History Overview Note   # July 2020 Left Breast- IMC; 14 mm; Grade: 2; Lymphovascular Invasion: Not identified;  pT1c pN0(sn); ER positive, PR positive, HER2 negative. [s/p lumpec; Dr.Byrnett]; rs/p RT [finished Sep, 9th 2020]  # Sep end 2020- START Arimidexa  # CKD-III/COPD/ CHF-compesated/PAD; CAD  DIAGNOSIS: Left breast cancer  STAGE: 1        ;GOALS: Cure  CURRENT/MOST RECENT THERAPY: arimidex   Malignant neoplasm of lower-inner quadrant of left female breast ([place]) (Resolved)  08/14/2018 Initial Diagnosis   Malignant neoplasm of lower-inner quadrant of left female breast [facility])   Carcinoma of lower-inner quadrant of left breast in female, estrogen receptor positive ([place])  10/02/2018 Initial Diagnosis   Carcinoma of lower-inner quadrant of left breast in female, estrogen receptor positive ([place])      Chief Complaint: Breast cancer   History of present illness:Tammy [name] 74 y.o.  female with history of stage I breast cancer ER/PR positive HER-2 negative currently on anastrozole is here for follow-up/review results of the bone density.  Patient admits to chronic mild joint pains complains of knee pain needing replacement.  Otherwise denies any worsening hot flashes.   Denies any worsening swelling in the  legs.  No new lumps or bumps.  Observation/objective:  Assessment and plan: Carcinoma of lower-inner quadrant of left breast in female, estrogen receptor positive ([place]) #Left breast stage I breast cancer ER PR positive HER-2 negative; status post lumpectomy.  On arimidex.  # Clinically no evidence of disease recurrence.  Continue anastrozole for total of 5 years.  # BMD- FEB 2021- T score : - 1.5. discussed re: fosomax [GERD/not interested in pills]; discussed regarding shots-patient wants to hold off for now.  She is interested in exercising on her knee is better.  # CKD- III- creat 1.6.Tammy [name] 2021-]; Dr.Kolluru/follow-up with PCP.  # DISPOSITION: #Follow-up in 6 months; MD-; labs- cbc/cmp-Dr. B  Follow-up instructions:  I discussed the assessment and treatment plan with the patient.  The patient was provided an opportunity to ask questions and all were answered.  The patient agreed with the plan and demonstrated understanding of instructions.  The patient was advised to call back or seek an in person evaluation if the symptoms worsen or if the condition fails to improve as anticipated.  Dr. Charlaine Dalton [place] at [facility] 06/03/2019 2:49 PM

## 2019-06-03 NOTE — Assessment & Plan Note (Addendum)
#  Left breast stage I breast cancer ER PR positive HER-2 negative; status post lumpectomy.  On arimidex.  # Clinically no evidence of disease recurrence.  Continue anastrozole for total of 5 years.  # BMD- FEB 2021- T score : - 1.5. discussed re: fosomax [GERD/not interested in pills]; discussed regarding shots-patient wants to hold off for now.  She is interested in exercising on her knee is better.  # CKD- III- creat 1.6.Luetta [place] 2021-]; Dr.Kolluru/follow-up with PCP.  # DISPOSITION: #Follow-up in 6 months; MD-; labs- cbc/cmp-Dr. B

## 2019-06-04 ENCOUNTER — Encounter: Payer: Self-pay | Admitting: Urology

## 2019-06-04 ENCOUNTER — Other Ambulatory Visit: Payer: Self-pay

## 2019-06-04 ENCOUNTER — Ambulatory Visit (INDEPENDENT_AMBULATORY_CARE_PROVIDER_SITE_OTHER): Payer: Medicare Other | Admitting: Urology

## 2019-06-04 VITALS — BP 133/83 | HR 76 | Ht 66.0 in | Wt 205.0 lb

## 2019-06-04 DIAGNOSIS — Q6239 Other obstructive defects of renal pelvis and ureter: Secondary | ICD-10-CM | POA: Diagnosis not present

## 2019-06-04 DIAGNOSIS — N390 Urinary tract infection, site not specified: Secondary | ICD-10-CM | POA: Diagnosis not present

## 2019-06-04 DIAGNOSIS — N3281 Overactive bladder: Secondary | ICD-10-CM

## 2019-06-04 NOTE — Progress Notes (Signed)
06/04/2019 10:32 AM   Tammy [name] 1945/11/05 [id number]  Referring provider: [name], DO [place],  [place] [address]  Chief Complaint  Patient presents with  . Follow-up    Urologic history: 1.  Recurrent UTI No recent problems  2.  Right UPJ obstruction -Asymptomatic -Initially diagnosed 2009 -Lasix renogram with 18% function -Saw Dr. Alinda Money for second opinion, no treatment recommended  3.  Overactive bladder -Previously on anticholinergic/beta 3 agonist -Presently off med   HPI: 74 y.o. female presents for follow-up.  Last seen December 2019  -Denies recurrent UTIs since her last visit -No flank, abdominal or pelvic pain -Denies dysuria or gross hematuria -Mild OAB symptoms which are not bothersome -Scheduled for knee replacement surgery later this Spring   PMH: Past Medical History:  Diagnosis Date  . Asthma   . Benign neoplasm of colon   . Cervical disc disease    "bulging" - no limitations per pt  . Chronic diastolic CHF (congestive heart failure) ([place])    a. 07/2012 Echo: Nl EF. mild inferoseptal HK, Gr 2 DD, mild conc LVH, mild PR/TR, mild PAH.  . CKD (chronic kidney disease), stage III   . COPD (chronic obstructive pulmonary disease) ([place])   . Coronary artery disease    a. 11/2011 Cath: LAD 92md, LCX min irregs, RCA 70p, 1035mith L->R collats, EF 60%-->Med Rx.  . Diabetes mellitus without complication ([place]  . Fusion of lumbar spine 12/22/2015  . GERD (gastroesophageal reflux disease)   . History of DVT (deep vein thrombosis)   . History of tobacco abuse    a. Quit 2011.  . Marland Kitchenyperlipidemia   . Hypertension   . Hypothyroidism   . Obesity   . Palpitations   . PE (pulmonary embolism) 10/15  . PONV (postoperative nausea and vomiting)   . Pulmonary embolus ([place]/15/2015   RLL. Presented with SOB and elevated d-dimer.   . Marland KitchenVD (peripheral vascular disease) ([place]   a. PTA of right leg and left femoral artery with stenosis.  .  Renal insufficiency   . [facility]   TIAs - no deficits    Surgical History: Past Surgical History:  Procedure Laterality Date  . ANGIOPLASTY / STENTING FEMORAL     PVD; angioplasty right leg and left femoral artery with stenosis.   . APPENDECTOMY  Oct. 2016  . BACK SURGERY  03/2012   nerve stimulator inserted  . BACK SURGERY     screws, rods replaced with new hardware.  . Marland KitchenACK SURGERY  11/2016  . BREAST SURGERY    . CARDIAC CATHETERIZATION  12/07/2011   Mid LAD 50%, distal LAD 50%, mid RCA 70%, distal RCA 100%   . CARDIAC CATHETERIZATION  01/04/2011   100% occluded mid RCA with good collaterals from distal LAD, normal LVEF.  . Marland KitchenHOLECYSTECTOMY    . COLONOSCOPY  2013  . COLONOSCOPY WITH PROPOFOL N/A 05/24/2015   Procedure: COLONOSCOPY WITH PROPOFOL;  Surgeon: DaLucilla LameMD;  [facility] Service: Endoscopy;  Laterality: N/A;  Diabetic - oral meds  . COLONOSCOPY WITH PROPOFOL N/A 01/28/2019   Procedure: COLONOSCOPY WITH PROPOFOL;  Surgeon: TaVirgel ManifoldMD;  Location: [facility];  Service: Endoscopy;  Laterality: N/A;  . DIAGNOSTIC LAPAROSCOPY    . ESOPHAGOGASTRODUODENOSCOPY (EGD) WITH PROPOFOL N/A 01/28/2019   Procedure: ESOPHAGOGASTRODUODENOSCOPY (EGD) WITH PROPOFOL;  Surgeon: TaVirgel ManifoldMD;  Location: [facility]COPY;  Service: Endoscopy;  Laterality: N/A;  .  LAPAROSCOPIC APPENDECTOMY N/A 01/06/2015   Procedure: APPENDECTOMY LAPAROSCOPIC;  Surgeon: [name], MD;  Location: [facility] ORS;  Service: General;  Laterality: N/A;  . LEFT HEART CATH AND CORONARY ANGIOGRAPHY Left 04/04/2019   Procedure: LEFT HEART CATH AND CORONARY ANGIOGRAPHY;  Surgeon: [name], MD;  [facility];  Service: Cardiovascular;  Laterality: Left;  [name] MASTECTOMY    . MASTECTOMY W/ SENTINEL NODE BIOPSY Left 08/26/2018   Procedure: PARTIAL MASTECTOMY WIDE EXCISION WITH SENTINEL LYMPH NODE BIOPSY LEFT;  Surgeon: [name],  MD;  Location: [facility] ORS;  Service: General;  Laterality: Left;  . POLYPECTOMY  05/24/2015   Procedure: POLYPECTOMY;  Surgeon: [name], MD;  Location: [place];  Service: Endoscopy;;  . THYROIDECTOMY    . TRIGGER FINGER RELEASE      Home Medications:  Allergies as of 06/04/2019      Reactions   Hydrocodone Other (See Comments)   Unresponsive   Aleve [naproxen Sodium]    Hives & itching    Contrast Media [iodinated Diagnostic Agents]    Pt avoids due to kidney issues   Ioxaglate Other (See Comments)   (iodine) Pt avoids due to kidney issues   Oxycodone Other (See Comments)   hallucinations   Ranexa [ranolazine]    Sick on stomach   Sulfa Antibiotics Other (See Comments)   Pt avoids due to kidney issues   Tramadol Other (See Comments)   nightmares      Medication List       Accurate as of June 04, 2019 10:32 AM. If you have any questions, ask your nurse or doctor.        anastrozole 1 MG tablet Commonly known as: ARIMIDEX TAKE ONE TABLET EVERY DAY   apixaban 2.5 MG Tabs tablet Commonly known as: Eliquis Take 1 tablet (2.5 mg) by mouth twice daily What changed:   how much to take  how to take this  when to take this  additional instructions   aspirin 81 MG chewable tablet Chew 81 mg by mouth once.   Blink Tears 0.25 % Soln Generic drug: Polyethylene Glycol 400 Place 1 drop into both eyes 3 (three) times daily as needed (dry/irritated eyes.).   blood glucose meter kit and supplies Kit Dispense based on patient and insurance preference. Use one time daily as directed, E11.9   Calcium 600 + D 600-200 MG-UNIT Tabs Generic drug: Calcium Carb-Cholecalciferol Take 1 tablet by mouth daily.   cetirizine 10 MG tablet Commonly known as: ZYRTEC Take 1 tablet (10 mg total) by mouth daily.   cholecalciferol 25 MCG (1000 UNIT) tablet Commonly known as: VITAMIN D3 Take 1,000 Units by mouth daily.   DULoxetine 60 MG capsule Commonly known as:  CYMBALTA TAKE 1 CAPSULE BY MOUTH ONCE DAILY   gabapentin 300 MG capsule Commonly known as: NEURONTIN TAKE 1 CAPSULE BY MOUTH THREE TIMES DAILY What changed:   how much to take  when to take this  additional instructions   glipiZIDE 5 MG tablet Commonly known as: GLUCOTROL TAKE ONE TABLET EVERY DAY BEFORE BREAKFAST What changed: See the new instructions.   isosorbide mononitrate 60 MG 24 hr tablet Commonly known as: IMDUR TAKE 1 TABLET BY MOUTH AT BEDTIME AND 1/2 TABLET IN THE MORNING What changed: See the new instructions.   Jardiance 25 MG Tabs tablet Generic drug: empagliflozin TAKE ONE TABLET 25MG EVERY DAY BEFORE BREAKFAST What changed: See the new instructions.   levothyroxine 150 MCG tablet Commonly  known as: SYNTHROID Take 1 tablet (150 mcg total) by mouth daily before breakfast.   losartan 25 MG tablet Commonly known as: COZAAR Take 1 tablet (25 mg total) by mouth daily.   metoprolol succinate 50 MG 24 hr tablet Commonly known as: TOPROL-XL TAKE 1 TABLET BY MOUTH DAILY WITH OR IMMEDIATLEY FOLLOWING A MEAL What changed:   how much to take  how to take this  when to take this  additional instructions   montelukast 10 MG tablet Commonly known as: SINGULAIR Take 1 tablet (10 mg total) by mouth at bedtime.   nitroGLYCERIN 0.4 MG SL tablet Commonly known as: NITROSTAT Place 1 tablet (0.4 mg total) under the tongue every 5 (five) minutes as needed for chest pain.   olopatadine 0.1 % ophthalmic solution Commonly known as: Pataday Place 1 drop into both eyes 2 (two) times daily.   pantoprazole 40 MG tablet Commonly known as: PROTONIX TAKE 1 TABLET BY MOUTH DAILY AS NEEDED What changed: when to take this   polyethylene glycol 17 g packet Commonly known as: MiraLax Mix-In Pax Take 17 g by mouth daily.   pregabalin 150 MG capsule Commonly known as: Lyrica Take 1 capsule (150 mg total) by mouth 2 (two) times daily.   ProAir HFA 108 (90 Base)  MCG/ACT inhaler Generic drug: albuterol TAKE 2 PUFFS EVERY 4 HOURS AS NEEDED FORWHEEZING OR SHORTNESS OF BREATH What changed:   how much to take  how to take this  when to take this  reasons to take this  additional instructions   rosuvastatin 40 MG tablet Commonly known as: CRESTOR TAKE 1 TABLET BY MOUTH DAILY   trimethoprim 100 MG tablet Commonly known as: TRIMPEX Take 1 tablet (100 mg total) by mouth daily.   Trulicity 1.5 AY/3.0ZS Sopn Generic drug: Dulaglutide Inject 1.5 mg into the skin every Tuesday.   Trulicity 3 WF/0.9NA Sopn Generic drug: Dulaglutide Inject 3 mg into the skin once a week.   ZINC PO Take 50 mg by mouth daily.       Allergies:  Allergies  Allergen Reactions  . Hydrocodone Other (See Comments)    Unresponsive  . Aleve [Naproxen Sodium]     Hives & itching   . Contrast Media [Iodinated Diagnostic Agents]     Pt avoids due to kidney issues  . Ioxaglate Other (See Comments)    (iodine) Pt avoids due to kidney issues   . Oxycodone Other (See Comments)    hallucinations  . Ranexa [Ranolazine]     Sick on stomach  . Sulfa Antibiotics Other (See Comments)    Pt avoids due to kidney issues  . Tramadol Other (See Comments)    nightmares    Family History: Family History  Problem Relation Age of Onset  . Hypertension Father   . Heart disease Father   . Heart attack Father   . Diabetes Father   . Cancer Mother        colon  . Heart attack Brother   . Diabetes Brother   . Hypertension Brother   . Heart disease Brother   . Heart attack Brother   . Diabetes Brother   . Hypertension Brother   . Heart disease Brother   . Heart attack Brother   . Diabetes Brother   . Hypertension Brother   . Heart disease Brother   . Cancer Sister        lung  . COPD Sister   . COPD Sister   . Breast  cancer Neg Hx     Social History:  reports that she quit smoking about 12 years ago. Her smoking use included cigarettes. She has a 7.50  pack-year smoking history. She has never used smokeless tobacco. She reports that she does not drink alcohol or use drugs.   Physical Exam: BP 133/83   Pulse 76   Ht 5' 6"  (1.676 m)   Wt 205 lb (93 kg)   BMI 33.09 kg/m   Constitutional:  Alert and oriented, No acute distress. HEENT: [place] AT, moist mucus membranes.  Trachea midline, no masses. Cardiovascular: No clubbing, cyanosis, or edema. Respiratory: Normal respiratory effort, no increased work of breathing.   Assessment & Plan:    - Recurrent UTI Presently asymptomatic.  Continue annual follow-up  -Chronic right UPJ obstruction  -Overactive bladder    [name], MD  [facility] [address], [place], [place] [address] [phone]

## 2019-06-17 DIAGNOSIS — Z8673 Personal history of transient ischemic attack (TIA), and cerebral infarction without residual deficits: Secondary | ICD-10-CM | POA: Diagnosis not present

## 2019-06-17 DIAGNOSIS — H532 Diplopia: Secondary | ICD-10-CM | POA: Diagnosis not present

## 2019-06-18 ENCOUNTER — Ambulatory Visit
Admission: RE | Admit: 2019-06-18 | Discharge: 2019-06-18 | Disposition: A | Discharge status: Home / Self Care | Payer: Medicare Other | Source: Ambulatory Visit | Attending: Radiation Oncology | Admitting: Radiation Oncology

## 2019-06-18 ENCOUNTER — Other Ambulatory Visit: Payer: Self-pay

## 2019-06-18 ENCOUNTER — Encounter: Payer: Self-pay | Admitting: Radiation Oncology

## 2019-06-18 VITALS — BP 140/64 | HR 84 | Temp 97.5°F | Resp 20 | Wt 219.0 lb

## 2019-06-18 DIAGNOSIS — Z923 Personal history of irradiation: Secondary | ICD-10-CM | POA: Diagnosis not present

## 2019-06-18 DIAGNOSIS — Z79811 Long term (current) use of aromatase inhibitors: Secondary | ICD-10-CM | POA: Diagnosis not present

## 2019-06-18 DIAGNOSIS — C50312 Malignant neoplasm of lower-inner quadrant of left female breast: Secondary | ICD-10-CM | POA: Diagnosis not present

## 2019-06-18 DIAGNOSIS — Z170 Estrogen receptor positive status [ER+]: Secondary | ICD-10-CM | POA: Diagnosis not present

## 2019-06-18 NOTE — Progress Notes (Signed)
Radiation Oncology Follow up Note  Name: Tammy [name]   Date:   06/18/2019 MRN:  [id number] DOB: 1945-07-16    This 74 y.o. female presents to the clinic today for 47-month follow-up status post whole breast radiation to her left breast for stage I ER/PR positive invasive mammary carcinoma.[name]  REFERRING PROVIDER: [name], DO  HPI: Patient is a 74 year old female now out 6 months having completed whole breast radiation to her left breast for stage I ER/PR positive invasive mammary carcinoma seen today in routine follow-up she is doing well she specifically denies breast tenderness cough or bone pain.  Currently on Arimidex tolerating that well without side effect.  She is scheduled for mammogram in May..  COMPLICATIONS OF TREATMENT: none  FOLLOW UP COMPLIANCE: keeps appointments   PHYSICAL EXAM:  There were no vitals taken for this visit.  Patient is well-developed wheelchair-bound female in NAD. Lungs are clear to A&P cardiac examination essentially unremarkable with regular rate and rhythm. No dominant mass or nodularity is noted in either breast in 2 positions examined. Incision is well-healed. No axillary or supraclavicular adenopathy is appreciated. Cosmetic result is excellent.  Well-developed well-nourished patient in NAD. HEENT reveals PERLA, EOMI, discs not visualized.  Oral cavity is clear. No oral mucosal lesions are identified. Neck is clear without evidence of cervical or supraclavicular adenopathy. Lungs are clear to A&P. Cardiac examination is essentially unremarkable with regular rate and rhythm without murmur rub or thrill. Abdomen is benign with no organomegaly or masses noted. Motor sensory and DTR levels are equal and symmetric in the upper and lower extremities. Cranial nerves II through XII are grossly intact. Proprioception is intact. No peripheral adenopathy or edema is identified. No motor or sensory levels are noted. Crude visual fields are within normal  range.  RADIOLOGY RESULTS: No current films to review  PLAN: Present time patient is doing well 6 months out with no evidence of disease.  And pleased with her overall progress follow-up go to once year follow-up appointments.  She does have mobility issues.  Patient knows to call sooner with any concerns at any time.  I would like to take this opportunity to thank you for allowing me to participate in the care of your patient.[name], MD

## 2019-06-26 ENCOUNTER — Other Ambulatory Visit: Payer: Self-pay | Admitting: General Surgery

## 2019-06-26 DIAGNOSIS — Z170 Estrogen receptor positive status [ER+]: Secondary | ICD-10-CM

## 2019-06-26 DIAGNOSIS — C50312 Malignant neoplasm of lower-inner quadrant of left female breast: Secondary | ICD-10-CM

## 2019-06-30 ENCOUNTER — Other Ambulatory Visit: Payer: Self-pay | Admitting: Urology

## 2019-07-01 ENCOUNTER — Other Ambulatory Visit: Payer: Self-pay | Admitting: Family Medicine

## 2019-07-01 NOTE — Telephone Encounter (Signed)
Requested Prescriptions  Pending Prescriptions Disp Refills  . JARDIANCE 25 MG TABS tablet [Pharmacy Med Name: JARDIANCE 25 MG TAB] 90 tablet 1    Sig: TAKE ONE TABLET 25MG [place]     Endocrinology:  Diabetes - SGLT2 Inhibitors Failed - 07/01/2019  3:53 PM      Failed - Cr in normal range and within 360 days    Creatinine  Date Value Ref Range Status  09/20/2011 1.31 (H) 0.60 - 1.30 mg/dL Final   Creatinine, Ser  Date Value Ref Range Status  05/20/2019 1.68 (H) 0.57 - 1.00 mg/dL Final         Failed - LDL in normal range and within 360 days    LDL Chol Calc (NIH)  Date Value Ref Range Status  01/02/2019 53 0 - 99 mg/dL Final         Failed - HBA1C is between 0 and 7.9 and within 180 days    Hemoglobin A1C  Date Value Ref Range Status  01/31/2016 8.7  Final   HB A1C (BAYER DCA - WAIVED)  Date Value Ref Range Status  04/08/2019 8.3 (H) <7.0 % Final    Comment:                                          Diabetic Adult            <7.0                                       Healthy Adult        4.3 - 5.7                                                           (DCCT/NGSP) American Diabetes Association's Summary of Glycemic Recommendations for Adults with Diabetes: Hemoglobin A1c <7.0%. More stringent glycemic goals (A1c <6.0%) may further reduce complications at the cost of increased risk of hypoglycemia.          Failed - eGFR in normal range and within 360 days    EGFR (African American)  Date Value Ref Range Status  09/20/2011 48 (L)  Final   GFR calc Af Amer  Date Value Ref Range Status  05/20/2019 34 (L) >59 mL/min/1.73 Final   EGFR (Non-African Amer.)  Date Value Ref Range Status  09/20/2011 42 (L)  Final    Comment:    eGFR values <37m/min/1.73 m2 may be an indication of chronic kidney disease (CKD). Calculated eGFR is useful in patients with stable renal function. The eGFR calculation will not be reliable in acutely ill patients when  serum creatinine is changing rapidly. It is not useful in  patients on dialysis. The eGFR calculation may not be applicable to patients at the low and high extremes of body sizes, pregnant women, and vegetarians.    GFR calc non Af Amer  Date Value Ref Range Status  05/20/2019 30 (L) >59 mL/min/1.73 Final         Passed - Valid encounter within last 6 months    Recent Outpatient Visits  1 month ago Seasonal allergic rhinitis due to pollen   [facility], Megan P, DO   1 month ago Uncontrolled type 2 diabetes mellitus with complication, without long-term current use of insulin ([place])   [place], Megan P, DO   2 months ago Chest pain, unspecified type   [facility], Megan P, DO   3 months ago Acute non-recurrent maxillary sinusitis   [facility], Megan P, DO   4 months ago Uncontrolled type 2 diabetes mellitus with complication, without long-term current use of insulin ([place])   [place], Megan P, DO      Future Appointments            In 1 week Johnson, Barb Merino, DO [employer], [facility]   In 2 months Gollan, [name], MD [employer], [place]   In 11 months [place], Ronda Fairly, [place]

## 2019-07-02 DIAGNOSIS — E119 Type 2 diabetes mellitus without complications: Secondary | ICD-10-CM | POA: Diagnosis not present

## 2019-07-02 LAB — HM DIABETES EYE EXAM

## 2019-07-08 ENCOUNTER — Ambulatory Visit (INDEPENDENT_AMBULATORY_CARE_PROVIDER_SITE_OTHER): Payer: Medicare Other | Admitting: Family Medicine

## 2019-07-08 ENCOUNTER — Encounter: Payer: Self-pay | Admitting: Family Medicine

## 2019-07-08 ENCOUNTER — Other Ambulatory Visit: Payer: Self-pay

## 2019-07-08 VITALS — BP 94/66 | HR 82 | Temp 97.4°F | Ht 64.17 in | Wt 218.4 lb

## 2019-07-08 DIAGNOSIS — E782 Mixed hyperlipidemia: Secondary | ICD-10-CM | POA: Diagnosis not present

## 2019-07-08 DIAGNOSIS — I200 Unstable angina: Secondary | ICD-10-CM | POA: Diagnosis not present

## 2019-07-08 DIAGNOSIS — N6325 Unspecified lump in the left breast, overlapping quadrants: Secondary | ICD-10-CM | POA: Diagnosis not present

## 2019-07-08 DIAGNOSIS — I129 Hypertensive chronic kidney disease with stage 1 through stage 4 chronic kidney disease, or unspecified chronic kidney disease: Secondary | ICD-10-CM | POA: Diagnosis not present

## 2019-07-08 DIAGNOSIS — N39490 Overflow incontinence: Secondary | ICD-10-CM | POA: Diagnosis not present

## 2019-07-08 DIAGNOSIS — E1165 Type 2 diabetes mellitus with hyperglycemia: Secondary | ICD-10-CM | POA: Diagnosis not present

## 2019-07-08 DIAGNOSIS — E118 Type 2 diabetes mellitus with unspecified complications: Secondary | ICD-10-CM

## 2019-07-08 DIAGNOSIS — IMO0002 Reserved for concepts with insufficient information to code with codable children: Secondary | ICD-10-CM

## 2019-07-08 LAB — BAYER DCA HB A1C WAIVED: HB A1C (BAYER DCA - WAIVED): 9.8 % — ABNORMAL HIGH (ref <7.0)

## 2019-07-08 MED ORDER — NITROFURANTOIN MONOHYD MACRO 100 MG PO CAPS: 100.0000 mg | ORAL_CAPSULE | Freq: Two times a day (BID) | ORAL | 0 refills | Status: DC

## 2019-07-08 MED ORDER — PANTOPRAZOLE SODIUM 40 MG PO TBEC: 40.0000 mg | DELAYED_RELEASE_TABLET | Freq: Every day | ORAL | 1 refills | Status: DC | PRN

## 2019-07-08 MED ORDER — METOPROLOL SUCCINATE ER 50 MG PO TB24: ORAL_TABLET | ORAL | 1 refills | Status: DC

## 2019-07-08 MED ORDER — LOSARTAN POTASSIUM 25 MG PO TABS: 25.0000 mg | ORAL_TABLET | Freq: Every day | ORAL | 1 refills | Status: DC

## 2019-07-08 NOTE — Progress Notes (Signed)
BP 94/66 (BP Location: Left Arm, Patient Position: Sitting, Cuff Size: Large)   Pulse 82   Temp (!) 97.4 F (36.3 C) (Oral)   Ht 5' 4.17" (1.63 m)   Wt 218 lb 6.4 oz (99.1 kg)   SpO2 96%   BMI 37.29 kg/m    Subjective:    Patient ID: Tammy [name], female    DOB: 1945/12/25, 74 y.o.   MRN: [id number]  HPI: Tammy [name] is a 74 y.o. female  Chief Complaint  Patient presents with  . Diabetes  . Hypertension  . Hyperlipidemia   BREAST LUMP Duration :1 week Location: left 9 o'clock Onset: sudden Severity: moderate with pushing on it Quality: sharp Frequency: intermittent Redness: no Swelling: no Trauma: no trauma Breastfeeding: no Associated with menstral cycle: no Nipple discharge: no Breast lump: yes Status: stable Treatments attempted: none Previous mammogram: yes   URINARY SYMPTOMS- has had no control of her bladder. Has been having incontinence, no warning Duration: about a month Dysuria: no Urinary frequency: yes Urgency: yes Small volume voids: no Symptom severity: severe Urinary incontinence: yes Foul odor: no Hematuria: no Abdominal pain: no Back pain: no Suprapubic pain/pressure: no Flank pain: no Fever:  no Vomiting: no Relief with cranberry juice: no Relief with pyridium: no Status: worse Previous urinary tract infection: yes Recurrent urinary tract infection: no History of sexually transmitted disease: no Vaginal discharge: no Treatments attempted: increasing fluids   HYPERTENSION / HYPERLIPIDEMIA Satisfied with current treatment? yes Duration of hypertension: chronic BP monitoring frequency: a few times a month BP medication side effects: no Duration of hyperlipidemia: chronic Cholesterol medication side effects: no Cholesterol supplements: none Past cholesterol medications: crestor Medication compliance: excellent compliance Aspirin: yes Recent stressors: no Recurrent headaches: no Visual changes: no Palpitations: no Dyspnea:  no Chest pain: no Lower extremity edema: no Dizzy/lightheaded: no  DIABETES Hypoglycemic episodes:no Polydipsia/polyuria: yes Visual disturbance: no Chest pain: no Paresthesias: yes Glucose Monitoring: yes  Accucheck frequency: Daily Taking Insulin?: no Blood Pressure Monitoring: not checking Retinal Examination: Not up to Date Foot Exam: Up to Date Diabetic Education: Completed Pneumovax: Up to Date Influenza: Up to Date Aspirin: yes  Relevant past medical, surgical, family and social history reviewed and updated as indicated. Interim medical history since our last visit reviewed. Allergies and medications reviewed and updated.  Review of Systems  Constitutional: Negative.   HENT: Negative.   Respiratory: Negative.   Cardiovascular: Negative.   Gastrointestinal: Negative.   Genitourinary: Positive for dysuria, frequency and urgency. Negative for decreased urine volume, difficulty urinating, dyspareunia, enuresis, flank pain, genital sores, hematuria, menstrual problem, pelvic pain, vaginal bleeding, vaginal discharge and vaginal pain.  Musculoskeletal: Negative.   Neurological: Positive for numbness. Negative for dizziness, tremors, seizures, syncope, facial asymmetry, speech difficulty, weakness, light-headedness and headaches.  Psychiatric/Behavioral: Negative.     Per HPI unless specifically indicated above     Objective:    BP 94/66 (BP Location: Left Arm, Patient Position: Sitting, Cuff Size: Large)   Pulse 82   Temp (!) 97.4 F (36.3 C) (Oral)   Ht 5' 4.17" (1.63 m)   Wt 218 lb 6.4 oz (99.1 kg)   SpO2 96%   BMI 37.29 kg/m   Wt Readings from Last 3 Encounters:  07/08/19 218 lb 6.4 oz (99.1 kg)  06/18/19 219 lb (99.3 kg)  06/04/19 205 lb (93 kg)    Physical Exam Vitals and nursing note reviewed.  Constitutional:      General: She is not in  acute distress.    Appearance: Normal appearance. She is not ill-appearing, toxic-appearing or diaphoretic.    HENT:     Head: Normocephalic and atraumatic.     Right Ear: External ear normal.     Left Ear: External ear normal.     Nose: Nose normal.     Mouth/Throat:     Mouth: Mucous membranes are moist.     Pharynx: Oropharynx is clear.  Eyes:     General: No scleral icterus.       Right eye: No discharge.        Left eye: No discharge.     Extraocular Movements: Extraocular movements intact.     Conjunctiva/sclera: Conjunctivae normal.     Pupils: Pupils are equal, round, and reactive to light.  Cardiovascular:     Rate and Rhythm: Normal rate and regular rhythm.     Pulses: Normal pulses.     Heart sounds: Normal heart sounds. No murmur. No friction rub. No gallop.   Pulmonary:     Effort: Pulmonary effort is normal. No respiratory distress.     Breath sounds: Normal breath sounds. No stridor. No wheezing, rhonchi or rales.  Chest:     Chest wall: No tenderness.    Musculoskeletal:        General: Normal range of motion.     Cervical back: Normal range of motion and neck supple.  Skin:    General: Skin is warm and dry.     Capillary Refill: Capillary refill takes less than 2 seconds.     Coloration: Skin is not jaundiced or pale.     Findings: No bruising, erythema, lesion or rash.  Neurological:     General: No focal deficit present.     Mental Status: She is alert and oriented to person, place, and time. Mental status is at baseline.  Psychiatric:        Mood and Affect: Mood normal.        Behavior: Behavior normal.        Thought Content: Thought content normal.        Judgment: Judgment normal.     Results for orders placed or performed in visit on 07/08/19  Microscopic Examination   URINE  Result Value Ref Range   WBC, UA >30 (A) 0 - 5 /hpf   RBC 11-30 (A) 0 - 2 /hpf   Epithelial Cells (non renal) 0-10 0 - 10 /hpf   Bacteria, UA Many (A) None seen/Few  Urine Culture, Reflex   URINE  Result Value Ref Range   Urine Culture, Routine WILL FOLLOW   Bayer DCA Hb  A1c Waived  Result Value Ref Range   HB A1C (BAYER DCA - WAIVED) 9.8 (H) <7.0 %  Comprehensive metabolic panel  Result Value Ref Range   Glucose 262 (H) 65 - 99 mg/dL   BUN 28 (H) 8 - 27 mg/dL   Creatinine, Ser 1.69 (H) 0.57 - 1.00 mg/dL   GFR calc non Af Amer 29 (L) >59 mL/min/1.73   GFR calc Af Amer 34 (L) >59 mL/min/1.73   BUN/Creatinine Ratio 17 12 - 28   Sodium 139 134 - 144 mmol/L   Potassium 5.1 3.5 - 5.2 mmol/L   Chloride 100 96 - 106 mmol/L   CO2 22 20 - 29 mmol/L   Calcium 8.9 8.7 - 10.3 mg/dL   Total Protein 6.7 6.0 - 8.5 g/dL   Albumin 4.0 3.7 - 4.7 g/dL   Globulin, Total 2.7  1.5 - 4.5 g/dL   Albumin/Globulin Ratio 1.5 1.2 - 2.2   Bilirubin Total 0.5 0.0 - 1.2 mg/dL   Alkaline Phosphatase 105 39 - 117 IU/L   AST 20 0 - 40 IU/L   ALT 14 0 - 32 IU/L  Lipid Panel w/o Chol/HDL Ratio  Result Value Ref Range   Cholesterol, Total 158 100 - 199 mg/dL   Triglycerides 319 (H) 0 - 149 mg/dL   HDL 50 >39 mg/dL   VLDL Cholesterol Cal 49 (H) 5 - 40 mg/dL   LDL Chol Calc (NIH) 59 0 - 99 mg/dL  UA/M w/rflx Culture, Routine   Specimen: Urine   URINE  Result Value Ref Range   Specific Gravity, UA 1.010 1.005 - 1.030   pH, UA 5.5 5.0 - 7.5   Color, UA Yellow Yellow   Appearance Ur Cloudy (A) Clear   Leukocytes,UA 1+ (A) Negative   Protein,UA 1+ (A) Negative/Trace   Glucose, UA 3+ (A) Negative   Ketones, UA Negative Negative   RBC, UA 2+ (A) Negative   Bilirubin, UA Negative Negative   Urobilinogen, Ur 0.2 0.2 - 1.0 mg/dL   Nitrite, UA Negative Negative   Microscopic Examination See below:    Urinalysis Reflex Comment       Assessment & Plan:   Problem List Items Addressed This Visit      Endocrine   Uncontrolled type 2 diabetes mellitus with complication, without long-term current use of insulin ([place]) - Primary    Not under good control with A1c of 9.8- will see kidney function and reach out to her nephrologist. Would benefit from going back on metformin if kidneys  can tolerate it. Continue to monitor. Call with any concerns. Await results.       Relevant Medications   losartan (COZAAR) 25 MG tablet   Other Relevant Orders   Bayer DCA Hb A1c Waived (Completed)   Comprehensive metabolic panel (Completed)     Genitourinary   Benign hypertensive renal disease    Under good control on current regimen. Continue current regimen. Continue to monitor. Call with any concerns. Refills given. Labs drawn today.       Relevant Orders   Comprehensive metabolic panel (Completed)     Other   Hyperlipidemia    Under good control on current regimen. Continue current regimen. Continue to monitor. Call with any concerns. Refills given. Labs drawn today       Relevant Medications   metoprolol succinate (TOPROL-XL) 50 MG 24 hr tablet   losartan (COZAAR) 25 MG tablet   Other Relevant Orders   Comprehensive metabolic panel (Completed)   Lipid Panel w/o Chol/HDL Ratio (Completed)    Other Visit Diagnoses    Breast lump on left side at 9 o'clock position       Will arrange for mammogram and [place] ASAP. Orders placed today.   Relevant Orders   MM 3D SCREEN BREAST UNI LEFT   US BREAST LTD UNI LEFT INC AXILLA   MM DIAG BREAST TOMO BILATERAL   US BREAST LTD UNI LEFT INC AXILLA   US BREAST LTD UNI RIGHT INC AXILLA   Overflow incontinence of urine       UA shows trace leuks. Likely due to elevated sugars. Await culture. Call with any concerns.    Relevant Orders   UA/M w/rflx Culture, Routine (Completed)       Follow up plan: Return in about 4 weeks (around 08/05/2019).

## 2019-07-08 NOTE — Patient Instructions (Signed)
[place]  Tuesday May 4th at 11:20am. Arrive 15 minutes prior.

## 2019-07-09 ENCOUNTER — Encounter: Payer: Self-pay | Admitting: Family Medicine

## 2019-07-09 LAB — COMPREHENSIVE METABOLIC PANEL
ALT: 14 IU/L (ref 0–32)
AST: 20 IU/L (ref 0–40)
Albumin/Globulin Ratio: 1.5 (ref 1.2–2.2)
Albumin: 4 g/dL (ref 3.7–4.7)
Alkaline Phosphatase: 105 IU/L (ref 39–117)
BUN/Creatinine Ratio: 17 (ref 12–28)
BUN: 28 mg/dL — ABNORMAL HIGH (ref 8–27)
Bilirubin Total: 0.5 mg/dL (ref 0.0–1.2)
CO2: 22 mmol/L (ref 20–29)
Calcium: 8.9 mg/dL (ref 8.7–10.3)
Chloride: 100 mmol/L (ref 96–106)
Creatinine, Ser: 1.69 mg/dL — ABNORMAL HIGH (ref 0.57–1.00)
GFR calc Af Amer: 34 mL/min/{1.73_m2} — ABNORMAL LOW (ref >59)
GFR calc non Af Amer: 29 mL/min/{1.73_m2} — ABNORMAL LOW (ref >59)
Globulin, Total: 2.7 g/dL (ref 1.5–4.5)
Glucose: 262 mg/dL — ABNORMAL HIGH (ref 65–99)
Potassium: 5.1 mmol/L (ref 3.5–5.2)
Sodium: 139 mmol/L (ref 134–144)
Total Protein: 6.7 g/dL (ref 6.0–8.5)

## 2019-07-09 LAB — LIPID PANEL W/O CHOL/HDL RATIO
Cholesterol, Total: 158 mg/dL (ref 100–199)
HDL: 50 mg/dL (ref >39)
LDL Chol Calc (NIH): 59 mg/dL (ref 0–99)
Triglycerides: 319 mg/dL — ABNORMAL HIGH (ref 0–149)
VLDL Cholesterol Cal: 49 mg/dL — ABNORMAL HIGH (ref 5–40)

## 2019-07-09 NOTE — Assessment & Plan Note (Signed)
Not under good control with A1c of 9.8- will see kidney function and reach out to her nephrologist. Would benefit from going back on metformin if kidneys can tolerate it. Continue to monitor. Call with any concerns. Await results.

## 2019-07-09 NOTE — Assessment & Plan Note (Signed)
Under good control on current regimen. Continue current regimen. Continue to monitor. Call with any concerns. Refills given. Labs drawn today.   

## 2019-07-11 LAB — MICROSCOPIC EXAMINATION: WBC, UA: >30 /hpf — AB (ref 0–5)

## 2019-07-11 LAB — UA/M W/RFLX CULTURE, ROUTINE
Bilirubin, UA: NEGATIVE
Ketones, UA: NEGATIVE
Nitrite, UA: NEGATIVE
Specific Gravity, UA: 1.01 (ref 1.005–1.030)
Urobilinogen, Ur: 0.2 mg/dL (ref 0.2–1.0)
pH, UA: 5.5 (ref 5.0–7.5)

## 2019-07-11 LAB — URINE CULTURE, REFLEX

## 2019-07-15 ENCOUNTER — Ambulatory Visit
Admission: RE | Admit: 2019-07-15 | Discharge: 2019-07-15 | Disposition: A | Discharge status: Home / Self Care | Payer: Medicare Other | Source: Ambulatory Visit | Attending: Family Medicine | Admitting: Family Medicine

## 2019-07-15 ENCOUNTER — Ambulatory Visit: Payer: Self-pay

## 2019-07-15 ENCOUNTER — Ambulatory Visit
Admission: EM | Admit: 2019-07-15 | Discharge: 2019-07-15 | Disposition: A | Discharge status: Home / Self Care | Payer: Medicare Other | Attending: Family Medicine | Admitting: Family Medicine

## 2019-07-15 ENCOUNTER — Ambulatory Visit: Payer: Medicare Other

## 2019-07-15 ENCOUNTER — Other Ambulatory Visit: Payer: Self-pay

## 2019-07-15 DIAGNOSIS — J9811 Atelectasis: Secondary | ICD-10-CM | POA: Diagnosis not present

## 2019-07-15 DIAGNOSIS — W19XXXA Unspecified fall, initial encounter: Secondary | ICD-10-CM | POA: Diagnosis not present

## 2019-07-15 DIAGNOSIS — S299XXA Unspecified injury of thorax, initial encounter: Secondary | ICD-10-CM | POA: Insufficient documentation

## 2019-07-15 DIAGNOSIS — N6325 Unspecified lump in the left breast, overlapping quadrants: Secondary | ICD-10-CM | POA: Insufficient documentation

## 2019-07-15 DIAGNOSIS — J90 Pleural effusion, not elsewhere classified: Secondary | ICD-10-CM | POA: Insufficient documentation

## 2019-07-15 DIAGNOSIS — N6489 Other specified disorders of breast: Secondary | ICD-10-CM | POA: Diagnosis not present

## 2019-07-15 DIAGNOSIS — R928 Other abnormal and inconclusive findings on diagnostic imaging of breast: Secondary | ICD-10-CM | POA: Diagnosis not present

## 2019-07-15 HISTORY — DX: Personal history of irradiation: Z92.3

## 2019-07-15 NOTE — Discharge Instructions (Signed)
Rest.  Tylenol 1000 mg three times daily for pain.  Take care  Dr. Lacinda Axon

## 2019-07-15 NOTE — Telephone Encounter (Signed)
Agree with er

## 2019-07-15 NOTE — ED Provider Notes (Signed)
[facility]    CSN: [id number] Arrival date & time: 07/15/19  1251  History   Chief Complaint Chief Complaint  Patient presents with  . Fall  . Rib Injury   HPI  74 year old female presents with the above complaint.  Patient states that she fell on Sunday while going to the restroom.  Patient states that she does not know how she fell.  Patient reports that she did hit her head and takes Eliquis.  No significant head injury reported.  No bleeding.  No loss of consciousness.  Patient complaining of left flank/left rib pain.  Severe.  10/10 in severity.  No relieving factors.  No other reported symptoms.  No other complaints.  Past Medical History:  Diagnosis Date  . Asthma   . Benign neoplasm of colon   . Breast cancer ([place]) 08/2018   left breast  . Cervical disc disease    "bulging" - no limitations per pt  . Chronic diastolic CHF (congestive heart failure) ([place])    a. 07/2012 Echo: Nl EF. mild inferoseptal HK, Gr 2 DD, mild conc LVH, mild PR/TR, mild PAH.  . CKD (chronic kidney disease), stage III   . COPD (chronic obstructive pulmonary disease) ([place])   . Coronary artery disease    a. 11/2011 Cath: LAD 23md, LCX min irregs, RCA 70p, 1033mith L->R collats, EF 60%-->Med Rx.  . Diabetes mellitus without complication ([place]  . Fusion of lumbar spine 12/22/2015  . GERD (gastroesophageal reflux disease)   . History of DVT (deep vein thrombosis)   . History of tobacco abuse    a. Quit 2011.  . Marland Kitchenyperlipidemia   . Hypertension   . Hypothyroidism   . Obesity   . Palpitations   . PE (pulmonary embolism) 10/15  . Personal history of radiation therapy   . PONV (postoperative nausea and vomiting)   . Pulmonary embolus (HC[facility]10/15/2015   RLL. Presented with SOB and elevated d-dimer.   . Marland KitchenVD (peripheral vascular disease) ([place]   a. PTA of right leg and left femoral artery with stenosis.  . Renal insufficiency   . [facility]   TIAs - no deficits    Patient  Active Problem List   Diagnosis Date Noted  . Unstable angina (HCCrescent City01/21/2021  . Dysphagia   . Thickening of esophagus   . Stomach irritation   . Gastric polyp   . Positive occult stool blood test   . Polyp of colon   . Carcinoma of lower-inner quadrant of left breast in female, estrogen receptor positive (HCHockinson07/22/2020  . Right knee pain 08/01/2017  . Chronic right-sided low back pain with right-sided sciatica 05/28/2017  . Gait abnormality 05/28/2017  . Hypothyroid 07/18/2016  . COPD (chronic obstructive pulmonary disease) ([facility]CVilla [place]05/10/2016  . Encephalomalacia on imaging study 12/10/2015  . Calcification of both carotid arteries 12/10/2015  . Benign neoplasm of sigmoid colon   . Benign neoplasm of cecum   . Benign neoplasm of ascending colon   . Bilateral atelectasis 04/11/2015  . Fatty liver disease, nonalcoholic 0165/79/0383. Hydronephrosis of right kidney 04/11/2015  . Abdominal aortic ectasia (HCTopaz01/29/2017  . Abdominal aortic atherosclerosis ([facility]CC[facility] [place]10/09/2014  . Diverticulosis of colon 12/18/2014  . Rhinitis, allergic 10/20/2014  . Coronary artery disease   . Morbid obesity due to excess calories (HCUnion  . Spinal stenosis of lumbar region 06/06/2014  . Herniated nucleus pulposus 06/06/2014  . Neuralgia neuritis, sciatic nerve 05/24/2014  .  Benign hypertensive renal disease 12/16/2013  . Uncontrolled type 2 diabetes mellitus with complication, without long-term current use of insulin ([place]) 12/16/2013  . Chronic renal insufficiency, stage III (moderate) 12/16/2013  . Frequent UTI 11/03/2013  . Overactive bladder 11/03/2013  . Coronary atherosclerosis of native coronary artery 06/19/2013  . History of back surgery 06/19/2013  . Hyperlipidemia 06/19/2013  . PAD (peripheral artery disease) ([place]) 06/19/2013  . Congenital obstruction of ureteropelvic junction 11/21/2011    Past Surgical History:  Procedure Laterality Date  . ANGIOPLASTY / STENTING FEMORAL     PVD;  angioplasty right leg and left femoral artery with stenosis.   . APPENDECTOMY  Oct. 2016  . BACK SURGERY  03/2012   nerve stimulator inserted  . BACK SURGERY     screws, rods replaced with new hardware.  [name] BACK SURGERY  11/2016  . BREAST LUMPECTOMY Left 08/26/2018  . BREAST SURGERY    . CARDIAC CATHETERIZATION  12/07/2011   Mid LAD 50%, distal LAD 50%, mid RCA 70%, distal RCA 100%   . CARDIAC CATHETERIZATION  01/04/2011   100% occluded mid RCA with good collaterals from distal LAD, normal LVEF.  [name] CHOLECYSTECTOMY    . COLONOSCOPY  2013  . COLONOSCOPY WITH PROPOFOL N/A 05/24/2015   Procedure: COLONOSCOPY WITH PROPOFOL;  Surgeon: [name], MD;  Location: [place];  Service: Endoscopy;  Laterality: N/A;  Diabetic - oral meds  . COLONOSCOPY WITH PROPOFOL N/A 01/28/2019   Procedure: COLONOSCOPY WITH PROPOFOL;  Surgeon: [name], MD;  Location: [facility];  Service: Endoscopy;  Laterality: N/A;  . DIAGNOSTIC LAPAROSCOPY    . ESOPHAGOGASTRODUODENOSCOPY (EGD) WITH PROPOFOL N/A 01/28/2019   Procedure: ESOPHAGOGASTRODUODENOSCOPY (EGD) WITH PROPOFOL;  Surgeon: [name], MD;  Location: [facility];  Service: Endoscopy;  Laterality: N/A;  . LAPAROSCOPIC APPENDECTOMY N/A 01/06/2015   Procedure: APPENDECTOMY LAPAROSCOPIC;  Surgeon: [name], MD;  Location: [facility];  Service: General;  Laterality: N/A;  . LEFT HEART CATH AND CORONARY ANGIOGRAPHY Left 04/04/2019   Procedure: LEFT HEART CATH AND CORONARY ANGIOGRAPHY;  Surgeon: [name], MD;  Location: [place] [facility];  Service: Cardiovascular;  Laterality: Left;  [name] MASTECTOMY    . MASTECTOMY W/ SENTINEL NODE BIOPSY Left 08/26/2018   Procedure: PARTIAL MASTECTOMY WIDE EXCISION WITH SENTINEL LYMPH NODE BIOPSY LEFT;  Surgeon: [name], MD;  Location: [facility];  Service: General;  Laterality: Left;  . POLYPECTOMY  05/24/2015   Procedure: POLYPECTOMY;  Surgeon: [name], MD;   Location: [place];  Service: Endoscopy;;  . THYROIDECTOMY    . TRIGGER FINGER RELEASE      OB History    Gravida  3   Para  2   Term      Preterm      AB  1   Living        SAB  1   TAB      Ectopic      Multiple      Live Births           Obstetric Comments  1st Menstrual Cycle:  13 1st Pregnancy:  16          Home Medications    Prior to Admission medications   Medication Sig Start Date End Date Taking? Authorizing Provider  anastrozole (ARIMIDEX) 1 MG tablet TAKE ONE TABLET EVERY DAY Patient taking differently: Take 1 mg by mouth daily.  03/25/19  Yes Cammie Sickle, MD  apixaban (  ELIQUIS) 2.5 MG TABS tablet Take 1 tablet (2.5 mg) by mouth twice daily Patient taking differently: Take 2.5 mg by mouth 2 (two) times daily.  08/29/18  Yes Byrnett, Forest Gleason, MD  blood glucose meter kit and supplies KIT Dispense based on patient and insurance preference. Use one time daily as directed, E11.9 04/23/15  Yes Lada, Satira Anis, MD  Calcium Carb-Cholecalciferol (CALCIUM 600 + D) 600-200 MG-UNIT TABS Take 1 tablet by mouth daily.    Yes [provider]  cetirizine (ZYRTEC) 10 MG tablet Take 1 tablet (10 mg total) by mouth daily. 05/27/19  Yes Johnson, Megan P, DO  cholecalciferol (VITAMIN D3) 25 MCG (1000 UT) tablet Take 1,000 Units by mouth daily.   Yes [provider]  Dulaglutide (TRULICITY) 3 QK/8.6NO SOPN Inject 3 mg into the skin once a week. 05/08/19  Yes Johnson, Megan P, DO  DULoxetine (CYMBALTA) 60 MG capsule TAKE 1 CAPSULE BY MOUTH ONCE DAILY 05/24/19  Yes Johnson, Megan P, DO  gabapentin (NEURONTIN) 300 MG capsule TAKE 1 CAPSULE BY MOUTH THREE TIMES DAILY Patient taking differently: Take 300-600 mg by mouth See admin instructions. Take 1 capsule (300 mg) by mouth in the morning & take 2 capsules (600 mg) by mouth at night. 10/21/18  Yes Johnson, Megan P, DO  glipiZIDE (GLUCOTROL) 5 MG tablet TAKE ONE TABLET EVERY DAY BEFORE  BREAKFAST Patient taking differently: Take 5 mg by mouth daily before breakfast. TAKE ONE TABLET EVERY DAY BEFORE BREAKFAST 02/14/19  Yes Johnson, Megan P, DO  isosorbide mononitrate (IMDUR) 60 MG 24 hr tablet TAKE 1 TABLET BY MOUTH AT BEDTIME AND 1/2 TABLET IN THE MORNING Patient taking differently: Take 30-60 mg by mouth See admin instructions. Take 0.5 tablet (30 mg) by mouth in the morning & take 1 capsule (60 mg) by mouth at night. 12/05/18  Yes Johnson, Megan P, DO  JARDIANCE 25 MG TABS tablet TAKE ONE TABLET 25MG EVERY DAY BEFORE BREAKFAST 07/01/19  Yes Johnson, Megan P, DO  levothyroxine (SYNTHROID) 150 MCG tablet Take 1 tablet (150 mcg total) by mouth daily before breakfast. 04/11/19  Yes Johnson, Megan P, DO  losartan (COZAAR) 25 MG tablet Take 1 tablet (25 mg total) by mouth daily. 07/08/19  Yes Johnson, Megan P, DO  metoprolol succinate (TOPROL-XL) 50 MG 24 hr tablet TAKE 1 TABLET BY MOUTH DAILY WITH OR IMMEDIATLEY FOLLOWING A MEAL 07/08/19  Yes Johnson, Megan P, DO  montelukast (SINGULAIR) 10 MG tablet Take 1 tablet (10 mg total) by mouth at bedtime. 05/27/19  Yes Johnson, Megan P, DO  Multiple Vitamins-Minerals (ZINC PO) Take 50 mg by mouth daily.    Yes [provider]  nitrofurantoin, macrocrystal-monohydrate, (MACROBID) 100 MG capsule Take 1 capsule (100 mg total) by mouth 2 (two) times daily. 07/08/19  Yes Johnson, Megan P, DO  nitroGLYCERIN (NITROSTAT) 0.4 MG SL tablet Place 1 tablet (0.4 mg total) under the tongue every 5 (five) minutes as needed for chest pain. 06/04/17  Yes Gollan, [name], MD  olopatadine (PATADAY) 0.1 % ophthalmic solution Place 1 drop into both eyes 2 (two) times daily. 05/27/19  Yes Johnson, Megan P, DO  pantoprazole (PROTONIX) 40 MG tablet Take 1 tablet (40 mg total) by mouth daily as needed. 07/08/19  Yes Johnson, Megan P, DO  polyethylene glycol (MIRALAX MIX-IN PAX) 17 g packet Take 17 g by mouth daily. 03/05/19  Yes [name], MD  Polyethylene  Glycol 400 (BLINK TEARS) 0.25 % SOLN Place 1 drop into  both eyes 3 (three) times daily as needed (dry/irritated eyes.).   Yes [provider]  PROAIR HFA 108 (90 Base) MCG/ACT inhaler TAKE 2 PUFFS EVERY 4 HOURS AS NEEDED FORWHEEZING OR SHORTNESS OF BREATH Patient taking differently: Inhale 1-2 puffs into the lungs every 4 (four) hours as needed (wheezing/shortness of breath.).  12/05/18  Yes Johnson, Megan P, DO  rosuvastatin (CRESTOR) 40 MG tablet TAKE 1 TABLET BY MOUTH DAILY Patient taking differently: Take 40 mg by mouth daily.  02/14/19  Yes Johnson, Megan P, DO  trimethoprim (TRIMPEX) 100 MG tablet Take 1 tablet (100 mg total) by mouth daily. 05/27/19  Yes [name], DO    Family History Family History  Problem Relation Age of Onset  . Hypertension Father   . Heart disease Father   . Heart attack Father   . Diabetes Father   . Cancer Mother        colon  . Heart attack Brother   . Diabetes Brother   . Hypertension Brother   . Heart disease Brother   . Heart attack Brother   . Diabetes Brother   . Hypertension Brother   . Heart disease Brother   . Heart attack Brother   . Diabetes Brother   . Hypertension Brother   . Heart disease Brother   . Cancer Sister        lung  . COPD Sister   . COPD Sister   . Breast cancer Neg Hx     Social History Social History   Tobacco Use  . Smoking status: Former Smoker    Packs/day: 0.50    Years: 15.00    Pack years: 7.50    Types: Cigarettes    Quit date: 03/14/2007    Years since quitting: 12.3  . Smokeless tobacco: Never Used  Substance Use Topics  . Alcohol use: No    Alcohol/week: 0.0 standard drinks  . Drug use: Never     Allergies   Hydrocodone, Aleve [naproxen sodium], Contrast media [iodinated diagnostic agents], Ioxaglate, Oxycodone, Ranexa [ranolazine], Sulfa antibiotics, and Tramadol   Review of Systems Review of Systems  Constitutional: Negative.   Musculoskeletal:       Left sided rib pain.     Physical Exam Triage Vital Signs ED Triage Vitals  Enc Vitals Group     BP 07/15/19 1314 125/62     Pulse Rate 07/15/19 1314 82     Resp 07/15/19 1314 18     Temp 07/15/19 1314 97.9 F (36.6 C)     Temp Source 07/15/19 1314 Oral     SpO2 07/15/19 1314 93 %     Weight 07/15/19 1311 210 lb (95.3 kg)     Height 07/15/19 1311 5' 5"  (1.651 m)     Head Circumference --      Peak Flow --      Pain Score 07/15/19 1311 10     Pain Loc --      Pain Edu? --      Excl. in [place]? --    Updated Vital Signs BP 125/62 (BP Location: Right Arm)   Pulse 82   Temp 97.9 F (36.6 C) (Oral)   Resp 18   Ht 5' 5"  (1.651 m)   Wt 95.3 kg   SpO2 93%   BMI 34.95 kg/m   Visual Acuity Right Eye Distance:   Left Eye Distance:   Bilateral Distance:    Right Eye Near:   Left Eye Near:  Bilateral Near:     Physical Exam Vitals and nursing note reviewed.  Constitutional:      General: She is not in acute distress.    Appearance: Normal appearance. She is not ill-appearing.  HENT:     Head: Normocephalic and atraumatic.  Eyes:     General:        Right eye: No discharge.        Left eye: No discharge.     Conjunctiva/sclera: Conjunctivae normal.  Cardiovascular:     Rate and Rhythm: Normal rate and regular rhythm.  Pulmonary:     Effort: Pulmonary effort is normal. No respiratory distress.     Breath sounds: Normal breath sounds.  Musculoskeletal:     Comments: Left sided lateral rib tenderness to palpation.  Neurological:     Mental Status: She is alert.  Psychiatric:        Mood and Affect: Mood normal.        Behavior: Behavior normal.    UC Treatments / Results  Labs (all labs ordered are listed, but only abnormal results are displayed) Labs Reviewed - No data to display  EKG   Radiology DG Ribs Unilateral W/Chest Left  Result Date: 07/15/2019 CLINICAL DATA:  Pain following fall EXAM: LEFT RIBS AND CHEST - 3+ VIEW COMPARISON:  Chest radiograph April 08, 2019.  FINDINGS: Frontal chest as well as oblique and cone-down rib images obtained. There is a fairly small left pleural effusion with left base atelectasis. The lungs otherwise are clear. Heart size and pulmonary vascular normal. No adenopathy. Thoracic stimulator leads are in the midthoracic region. No pneumothorax evident.  No rib fracture. IMPRESSION: No demonstrable rib fracture. No pneumothorax. There is a small left pleural effusion with mild left base atelectasis. Lungs elsewhere clear. Cardiac silhouette normal. Thoracic stimulator leads in midthoracic region. Electronically Signed   By: Lowella Grip III M.D.   On: 07/15/2019 14:13   US BREAST LTD UNI LEFT INC AXILLA  Result Date: 07/15/2019 CLINICAL DATA:  Patient with recent left breast lumpectomy and radiation therapy presenting for palpable area of concern within the left breast. EXAM: DIGITAL DIAGNOSTIC BILATERAL MAMMOGRAM WITH CAD AND TOMO ULTRASOUND LEFT BREAST COMPARISON:  Previous exam(s). ACR Breast Density Category b: There are scattered areas of fibroglandular density. FINDINGS: Interval postlumpectomy changes left breast. No new masses, calcifications or nonsurgical distortion identified within the left breast. Mammographic images were processed with CAD. On physical exam, dense tissue is palpated within the lower inner left breast at the site of patient reported palpable concern. Targeted ultrasound is performed, showing increased echogenicity of the underlying tissues at the site of palpable concern most compatible with scar site and postsurgical changes. Additionally, there is likely associated fat necrosis at this site. IMPRESSION: No suspicious abnormality at the site of palpable concern left breast. Findings favored to represent postsurgical changes and associated fat necrosis. RECOMMENDATION: Given the findings on mammogram and ultrasound at the site of palpable concern, favored to represent postsurgical changes, a follow-up left  breast diagnostic mammogram and possible ultrasound is recommended in 6 months to reassess this area. I have discussed the findings and recommendations with the patient. If applicable, a reminder letter will be sent to the patient regarding the next appointment. BI-RADS CATEGORY  3: Probably benign. Electronically Signed   By: Lovey Newcomer M.D.   On: 07/15/2019 12:40   MM DIAG BREAST TOMO BILATERAL  Result Date: 07/15/2019 CLINICAL DATA:  Patient with recent left breast lumpectomy and radiation  therapy presenting for palpable area of concern within the left breast. EXAM: DIGITAL DIAGNOSTIC BILATERAL MAMMOGRAM WITH CAD AND TOMO ULTRASOUND LEFT BREAST COMPARISON:  Previous exam(s). ACR Breast Density Category b: There are scattered areas of fibroglandular density. FINDINGS: Interval postlumpectomy changes left breast. No new masses, calcifications or nonsurgical distortion identified within the left breast. Mammographic images were processed with CAD. On physical exam, dense tissue is palpated within the lower inner left breast at the site of patient reported palpable concern. Targeted ultrasound is performed, showing increased echogenicity of the underlying tissues at the site of palpable concern most compatible with scar site and postsurgical changes. Additionally, there is likely associated fat necrosis at this site. IMPRESSION: No suspicious abnormality at the site of palpable concern left breast. Findings favored to represent postsurgical changes and associated fat necrosis. RECOMMENDATION: Given the findings on mammogram and ultrasound at the site of palpable concern, favored to represent postsurgical changes, a follow-up left breast diagnostic mammogram and possible ultrasound is recommended in 6 months to reassess this area. I have discussed the findings and recommendations with the patient. If applicable, a reminder letter will be sent to the patient regarding the next appointment. BI-RADS CATEGORY  3:  Probably benign. Electronically Signed   By: Lovey Newcomer M.D.   On: 07/15/2019 12:40    Procedures Procedures (including critical care time)  Medications Ordered in UC Medications - No data to display  Initial Impression / Assessment and Plan / UC Course  I have reviewed the triage vital signs and the nursing notes.  Pertinent labs & imaging results that were available during my care of the patient were reviewed by me and considered in my medical decision making (see chart for details).    74 year old female presents with traumatic left-sided rib pain.  X-ray negative for fracture.  He did revealed a mild left pleural effusion.  Patient cannot tolerate NSAIDs or opioids.  Tylenol as needed for pain.  Advise follow-up with primary care physician.  Final Clinical Impressions(s) / UC Diagnoses   Final diagnoses:  Traumatic injury of rib     Discharge Instructions     Rest.  Tylenol 1000 mg three times daily for pain.  Take care  Dr. Lacinda Axon    ED Prescriptions    None     PDMP not reviewed this encounter.   Coral Spikes, [place] 07/15/19 1518

## 2019-07-15 NOTE — Telephone Encounter (Signed)
Patient notified

## 2019-07-15 NOTE — ED Triage Notes (Signed)
Patient complains of left rib cage pain that started after a fall on Sunday in a restaurant bathroom. Patient states that she did hit a plastic trashcan. States that she also hit her head but does take Eliquis. Patient states that rib pain worsened last night and has been constant.

## 2019-07-15 NOTE — Telephone Encounter (Signed)
Pt. Reports she fell Sunday in a restaurant bathroom. She hit the back of her head and hit left side of chest.States "My head isn\'t bothering me, I think I might have broken a rib." Had pain all night. Hurts to take a deep breath. Pain is 10/10. States she has to have a mammogram this morning at 11:00. Requests an order for rib x-ray. Instructed pt. With hitting her head and being on a blood thinner she needs to go to ED and they could check her chest injury as well. Wants to know if Dr. Johnson agrees with disposition. Spoke with Joliza in the practice. Will forward note for review. Please advise pt. Reason for Disposition . [1] Can\'t take a deep breath BUT [2] no respiratory distress  Answer Assessment - Initial Assessment Questions 1. MECHANISM: "How did the injury happen?"     Fell in a bathroom 2. ONSET: "When did the injury happen?" (Minutes or hours ago)     Sunday 3. LOCATION: "Where on the chest is the injury located?"     Left side 4. APPEARANCE: "What does the injury look like?"     No visible bruising 5. BLEEDING: "Is there any bleeding now? If so, ask: How long has it been bleeding?"     No 6. SEVERITY: "Any difficulty with breathing?"     Hurts with deep breath 7. SIZE: For cuts, bruises, or swelling, ask: "How large is it?" (e.g., inches or centimeters)     No cuts 8. PAIN: "Is there pain?" If so, ask: "How bad is the pain?"   (e.g., Scale 1-10; or mild, moderate, severe)     10  9. TETANUS: For any breaks in the skin, ask: "When was the last tetanus booster?"     Unsure 10. PREGNANCY: "Is there any chance you are pregnant?" "When was your last menstrual period?"       n/a  Protocols used: CHEST INJURY-A-AH

## 2019-07-16 ENCOUNTER — Telehealth: Payer: Medicare Other | Admitting: Family Medicine

## 2019-07-17 ENCOUNTER — Other Ambulatory Visit: Payer: Self-pay

## 2019-07-17 ENCOUNTER — Emergency Department: Payer: Medicare Other

## 2019-07-17 ENCOUNTER — Emergency Department
Admission: EM | Admit: 2019-07-17 | Discharge: 2019-07-17 | Disposition: A | Discharge status: Home / Self Care | Payer: Medicare Other | Attending: Emergency Medicine | Admitting: Emergency Medicine

## 2019-07-17 ENCOUNTER — Ambulatory Visit: Payer: Self-pay | Admitting: *Deleted

## 2019-07-17 DIAGNOSIS — I5032 Chronic diastolic (congestive) heart failure: Secondary | ICD-10-CM | POA: Diagnosis not present

## 2019-07-17 DIAGNOSIS — Z7984 Long term (current) use of oral hypoglycemic drugs: Secondary | ICD-10-CM | POA: Insufficient documentation

## 2019-07-17 DIAGNOSIS — E1122 Type 2 diabetes mellitus with diabetic chronic kidney disease: Secondary | ICD-10-CM | POA: Insufficient documentation

## 2019-07-17 DIAGNOSIS — I130 Hypertensive heart and chronic kidney disease with heart failure and stage 1 through stage 4 chronic kidney disease, or unspecified chronic kidney disease: Secondary | ICD-10-CM | POA: Insufficient documentation

## 2019-07-17 DIAGNOSIS — I2510 Atherosclerotic heart disease of native coronary artery without angina pectoris: Secondary | ICD-10-CM | POA: Diagnosis not present

## 2019-07-17 DIAGNOSIS — J449 Chronic obstructive pulmonary disease, unspecified: Secondary | ICD-10-CM | POA: Insufficient documentation

## 2019-07-17 DIAGNOSIS — R911 Solitary pulmonary nodule: Secondary | ICD-10-CM | POA: Diagnosis not present

## 2019-07-17 DIAGNOSIS — R0789 Other chest pain: Secondary | ICD-10-CM | POA: Insufficient documentation

## 2019-07-17 DIAGNOSIS — N183 Chronic kidney disease, stage 3 unspecified: Secondary | ICD-10-CM | POA: Diagnosis not present

## 2019-07-17 DIAGNOSIS — Z86711 Personal history of pulmonary embolism: Secondary | ICD-10-CM | POA: Diagnosis not present

## 2019-07-17 DIAGNOSIS — S20212A Contusion of left front wall of thorax, initial encounter: Secondary | ICD-10-CM | POA: Diagnosis not present

## 2019-07-17 DIAGNOSIS — R0602 Shortness of breath: Secondary | ICD-10-CM | POA: Diagnosis not present

## 2019-07-17 DIAGNOSIS — R0781 Pleurodynia: Secondary | ICD-10-CM | POA: Diagnosis not present

## 2019-07-17 LAB — CBC
HCT: 40.1 % (ref 36.0–46.0)
Hemoglobin: 12.8 g/dL (ref 12.0–15.0)
MCH: 29 pg (ref 26.0–34.0)
MCHC: 31.9 g/dL (ref 30.0–36.0)
MCV: 90.9 fL (ref 80.0–100.0)
Platelets: 270 10*3/uL (ref 150–400)
RBC: 4.41 MIL/uL (ref 3.87–5.11)
RDW: 14.1 % (ref 11.5–15.5)
WBC: 10.2 10*3/uL (ref 4.0–10.5)
nRBC: 0 % (ref 0.0–0.2)

## 2019-07-17 LAB — BASIC METABOLIC PANEL
Anion gap: 10 (ref 5–15)
BUN: 25 mg/dL — ABNORMAL HIGH (ref 8–23)
CO2: 25 mmol/L (ref 22–32)
Calcium: 8.8 mg/dL — ABNORMAL LOW (ref 8.9–10.3)
Chloride: 103 mmol/L (ref 98–111)
Creatinine, Ser: 1.78 mg/dL — ABNORMAL HIGH (ref 0.44–1.00)
GFR calc Af Amer: 32 mL/min — ABNORMAL LOW (ref >60)
GFR calc non Af Amer: 27 mL/min — ABNORMAL LOW (ref >60)
Glucose, Bld: 225 mg/dL — ABNORMAL HIGH (ref 70–99)
Potassium: 4.5 mmol/L (ref 3.5–5.1)
Sodium: 138 mmol/L (ref 135–145)

## 2019-07-17 LAB — TROPONIN I (HIGH SENSITIVITY)
Troponin I (High Sensitivity): 6 ng/L (ref <18)
Troponin I (High Sensitivity): 5 ng/L (ref <18)

## 2019-07-17 MED ORDER — IPRATROPIUM-ALBUTEROL 0.5-2.5 (3) MG/3ML IN SOLN
3.0000 mL | Freq: Once | RESPIRATORY_TRACT | Status: AC
  Administered 2019-07-17: 3 mL via RESPIRATORY_TRACT
  Filled 2019-07-17: qty 3

## 2019-07-17 MED ORDER — SODIUM CHLORIDE 0.9% FLUSH: 3.0000 mL | Freq: Once | INTRAVENOUS | Status: DC

## 2019-07-17 NOTE — Telephone Encounter (Signed)
Patient is at the ER.

## 2019-07-17 NOTE — ED Provider Notes (Signed)
[facility] Emergency Department Provider Note       Time seen: ----------------------------------------- 6:54 PM on 07/17/2019 -----------------------------------------   I have reviewed the triage vital signs and the nursing notes.  HISTORY   Chief Complaint Shortness of Breath    HPI Tammy [name] is a 74 y.o. female with a history of asthma, breast cancer, chronic diastolic heart failure, CKD, COPD, coronary disease, diabetes, hyperlipidemia, hypertension, PE who presents to the ED for shortness of breath.  Patient states she fell Saturday and has been having left-sided rib pain and shortness of breath since that time.  She has been having severe pain and is unable to sleep.  Patient currently takes Eliquis.  Past Medical History:  Diagnosis Date  . Asthma   . Benign neoplasm of colon   . Breast cancer ([place]) 08/2018   left breast  . Cervical disc disease    "bulging" - no limitations per pt  . Chronic diastolic CHF (congestive heart failure) ([place])    a. 07/2012 Echo: Nl EF. mild inferoseptal HK, Gr 2 DD, mild conc LVH, mild PR/TR, mild PAH.  . CKD (chronic kidney disease), stage III   . COPD (chronic obstructive pulmonary disease) ([place])   . Coronary artery disease    a. 11/2011 Cath: LAD 24m/d, LCX min irregs, RCA 70p, 163m with L->R collats, EF 60%-->Med Rx.  . Diabetes mellitus without complication ([place])   . Fusion of lumbar spine 12/22/2015  . GERD (gastroesophageal reflux disease)   . History of DVT (deep vein thrombosis)   . History of tobacco abuse    a. Quit 2011.  [name] Hyperlipidemia   . Hypertension   . Hypothyroidism   . Obesity   . Palpitations   . PE (pulmonary embolism) 10/15  . Personal history of radiation therapy   . PONV (postoperative nausea and vomiting)   . Pulmonary embolus ([place]) 12/25/2013   RLL. Presented with SOB and elevated d-dimer.   [name] PVD (peripheral vascular disease) ([place])    a. PTA of right leg and left femoral  artery with stenosis.  . Renal insufficiency   . Stroke [facility]) 2015   TIAs - no deficits    Patient Active Problem List   Diagnosis Date Noted  . Unstable angina ([place]) 04/03/2019  . Dysphagia   . Thickening of esophagus   . Stomach irritation   . Gastric polyp   . Positive occult stool blood test   . Polyp of colon   . Carcinoma of lower-inner quadrant of left breast in female, estrogen receptor positive ([place]) 10/02/2018  . Right knee pain 08/01/2017  . Chronic right-sided low back pain with right-sided sciatica 05/28/2017  . Gait abnormality 05/28/2017  . Hypothyroid 07/18/2016  . COPD (chronic obstructive pulmonary disease) ([place]) 07/18/2016  . Encephalomalacia on imaging study 12/10/2015  . Calcification of both carotid arteries 12/10/2015  . Benign neoplasm of sigmoid colon   . Benign neoplasm of cecum   . Benign neoplasm of ascending colon   . Bilateral atelectasis 04/11/2015  . Fatty liver disease, nonalcoholic 09/03/7626  . Hydronephrosis of right kidney 04/11/2015  . Abdominal aortic ectasia ([place]) 04/11/2015  . Abdominal aortic atherosclerosis ([place]) 12/18/2014  . Diverticulosis of colon 12/18/2014  . Rhinitis, allergic 10/20/2014  . Coronary artery disease   . Morbid obesity due to excess calories ([place])   . Spinal stenosis of lumbar region 06/06/2014  . Herniated nucleus pulposus 06/06/2014  . Neuralgia neuritis, sciatic nerve 05/24/2014  . Benign  hypertensive renal disease 12/16/2013  . Uncontrolled type 2 diabetes mellitus with complication, without long-term current use of insulin ([place]) 12/16/2013  . Chronic renal insufficiency, stage III (moderate) 12/16/2013  . Frequent UTI 11/03/2013  . Overactive bladder 11/03/2013  . Coronary atherosclerosis of native coronary artery 06/19/2013  . History of back surgery 06/19/2013  . Hyperlipidemia 06/19/2013  . PAD (peripheral artery disease) ([place]) 06/19/2013  . Congenital obstruction of ureteropelvic junction 11/21/2011     Past Surgical History:  Procedure Laterality Date  . ANGIOPLASTY / STENTING FEMORAL     PVD; angioplasty right leg and left femoral artery with stenosis.   . APPENDECTOMY  Oct. 2016  . BACK SURGERY  03/2012   nerve stimulator inserted  . BACK SURGERY     screws, rods replaced with new hardware.  [name] BACK SURGERY  11/2016  . BREAST LUMPECTOMY Left 08/26/2018  . BREAST SURGERY    . CARDIAC CATHETERIZATION  12/07/2011   Mid LAD 50%, distal LAD 50%, mid RCA 70%, distal RCA 100%   . CARDIAC CATHETERIZATION  01/04/2011   100% occluded mid RCA with good collaterals from distal LAD, normal LVEF.  [name] CHOLECYSTECTOMY    . COLONOSCOPY  2013  . COLONOSCOPY WITH PROPOFOL N/A 05/24/2015   Procedure: COLONOSCOPY WITH PROPOFOL;  Surgeon: [name], MD;  Location: [place] [facility];  Service: Endoscopy;  Laterality: N/A;  Diabetic - oral meds  . COLONOSCOPY WITH PROPOFOL N/A 01/28/2019   Procedure: COLONOSCOPY WITH PROPOFOL;  Surgeon: [name], MD;  Location: [facility];  Service: Endoscopy;  Laterality: N/A;  . DIAGNOSTIC LAPAROSCOPY    . ESOPHAGOGASTRODUODENOSCOPY (EGD) WITH PROPOFOL N/A 01/28/2019   Procedure: ESOPHAGOGASTRODUODENOSCOPY (EGD) WITH PROPOFOL;  Surgeon: [name], MD;  Location: [facility]COPY;  Service: Endoscopy;  Laterality: N/A;  . LAPAROSCOPIC APPENDECTOMY N/A 01/06/2015   Procedure: APPENDECTOMY LAPAROSCOPIC;  Surgeon: [name], MD;  Location: [facility];  Service: General;  Laterality: N/A;  . LEFT HEART CATH AND CORONARY ANGIOGRAPHY Left 04/04/2019   Procedure: LEFT HEART CATH AND CORONARY ANGIOGRAPHY;  Surgeon: [name], MD;  Location: [facility];  Service: Cardiovascular;  Laterality: Left;  [name] MASTECTOMY    . MASTECTOMY W/ SENTINEL NODE BIOPSY Left 08/26/2018   Procedure: PARTIAL MASTECTOMY WIDE EXCISION WITH SENTINEL LYMPH NODE BIOPSY LEFT;  Surgeon: [name], MD;  Location: [facility];  Service: General;   Laterality: Left;  . POLYPECTOMY  05/24/2015   Procedure: POLYPECTOMY;  Surgeon: [name], MD;  Location: [facility];  Service: Endoscopy;;  . THYROIDECTOMY    . TRIGGER FINGER RELEASE      Allergies Hydrocodone, Aleve [naproxen sodium], Contrast media [iodinated diagnostic agents], Ioxaglate, Oxycodone, Ranexa [ranolazine], Sulfa antibiotics, and Tramadol  Social History Social History   Tobacco Use  . Smoking status: Former Smoker    Packs/day: 0.50    Years: 15.00    Pack years: 7.50    Types: Cigarettes    Quit date: 03/14/2007    Years since quitting: 12.3  . Smokeless tobacco: Never Used  Substance Use Topics  . Alcohol use: No    Alcohol/week: 0.0 standard drinks  . Drug use: Never    Review of Systems Constitutional: Negative for fever. Cardiovascular: Positive for left-sided chest and rib pain Respiratory: Positive for shortness of breath Gastrointestinal: Negative for abdominal pain, vomiting and diarrhea. Musculoskeletal: Negative for back pain. Skin: Negative for rash. Neurological: Negative for headaches, focal weakness or numbness.  All systems negative/normal/unremarkable  except as stated in the HPI  ____________________________________________   PHYSICAL EXAM:  VITAL SIGNS: ED Triage Vitals  Enc Vitals Group     BP 07/17/19 1545 (!) 112/56     Pulse Rate 07/17/19 1545 77     Resp 07/17/19 1545 18     Temp 07/17/19 1545 98.2 F (36.8 C)     Temp src --      SpO2 07/17/19 1545 92 %     Weight 07/17/19 1546 210 lb (95.3 kg)     Height 07/17/19 1546 5\' 5"  (1.651 m)     Head Circumference --      Peak Flow --      Pain Score 07/17/19 1545 0     Pain Loc --      Pain Edu? --      Excl. in [place]? --     Constitutional: Alert and oriented. Well appearing and in no distress. Eyes: Conjunctivae are normal. Normal extraocular movements. ENT      Head: Normocephalic and atraumatic.      Nose: No congestion/rhinnorhea.      Mouth/Throat:  Mucous membranes are moist.      Neck: No stridor. Cardiovascular: Normal rate, regular rhythm. No murmurs, rubs, or gallops.  Left-sided posterior axillary rib tenderness Respiratory: Normal respiratory effort without tachypnea nor retractions. Breath sounds are clear and equal bilaterally. No wheezes/rales/rhonchi. Gastrointestinal: Soft and nontender. Normal bowel sounds Musculoskeletal: Nontender with normal range of motion in extremities. No lower extremity tenderness nor edema. Neurologic:  Normal speech and language. No gross focal neurologic deficits are appreciated.  Skin:  Skin is warm, dry and intact. No rash noted. Psychiatric: Mood and affect are normal. Speech and behavior are normal.  ____________________________________________  EKG: Interpreted by me.  Sinus rhythm with rate of 80 bpm, LVH, leftward axis, normal QT  ____________________________________________  ED COURSE:  As part of my medical decision making, I reviewed the following data within the [facility] History obtained from family if available, nursing notes, old chart and ekg, as well as notes from prior ED visits. Patient presented for left-sided rib pain and shortness of breath, we will assess with labs and imaging as indicated at this time.   Procedures  Tammy [name] was evaluated in Emergency Department on 07/17/2019 for the symptoms described in the history of present illness. She was evaluated in the context of the global COVID-19 pandemic, which necessitated consideration that the patient might be at risk for infection with the SARS-CoV-2 virus that causes COVID-19. Institutional protocols and algorithms that pertain to the evaluation of patients at risk for COVID-19 are in a state of rapid change based on information released by regulatory bodies including the CDC and federal and state organizations. These policies and algorithms were followed during the patient's care in the ED.   ____________________________________________   LABS (pertinent positives/negatives)  Labs Reviewed  BASIC METABOLIC PANEL - Abnormal; Notable for the following components:      Result Value   Glucose, Bld 225 (*)    BUN 25 (*)    Creatinine, Ser 1.78 (*)    Calcium 8.8 (*)    GFR calc non Af Amer 27 (*)    GFR calc Af Amer 32 (*)    All other components within normal limits  CBC  TROPONIN I (HIGH SENSITIVITY)  TROPONIN I (HIGH SENSITIVITY)    RADIOLOGY Images were viewed by me  CT chest without contrast IMPRESSION: 1. No evidence of acute  traumatic injury within the chest. No displaced rib fracture 2. There is a 7 mm subpleural nodule of the peripheral left upper lobe which has increased in size compared to prior examination dated 03/01/2018, previously approximately 3 mm (series 2, image 56). There is a 3 mm pulmonary nodule of the adjacent peripheral left upper lobe which has also increased in size compared to prior examination, previously 2 mm (series 2, image 54). Consider follow-up CT in 3-6 months to ensure stability. 3. Emphysema (ICD10-J43.9). 4. Coronary artery disease. 5. Aortic Atherosclerosis (ICD10-I70.0). Unchanged enlargement of the descending thoracic aorta, measuring up to 3.6 x 3.4 cm.  ____________________________________________   DIFFERENTIAL DIAGNOSIS   Pleural effusion, pleuritis, pneumonia, rib fracture, hemothorax, pneumothorax  FINAL ASSESSMENT AND PLAN  Fall, left rib pain, pulmonary nodule   Plan: The patient had presented for persistent left-sided rib pain and shortness of breath. Patient's labs were overall at her baseline. Patient's imaging did reveal several nodules in the left lung and this will need to be rechecked.  She is on Eliquis which should make PE unlikely.  She will be discharged with pain medicine and is otherwise cleared for outpatient follow-up.   Laurence Aly, MD    Note: This note was generated in part  or whole with voice recognition software. Voice recognition is usually quite accurate but there are transcription errors that can and very often do occur. I apologize for any typographical errors that were not detected and corrected.     Earleen Newport, MD 07/17/19 2013

## 2019-07-17 NOTE — Telephone Encounter (Signed)
Call disconnected in transfer- attempted to call patient back x2- left message on voice mail to call back.Called husband number.  Patient has pulse O2sat- 79- SOB when sitting- triage stopped- advised ED now- husband will take her- advised 911 if gets worse.  Reason for Disposition . [1] MODERATE difficulty breathing (e.g., speaks in phrases, SOB even at rest, pulse 100-120) AND [2] NEW-onset or WORSE than normal  Answer Assessment - Initial Assessment Questions 1. RESPIRATORY STATUS: "Describe your breathing?" (e.g., wheezing, shortness of breath, unable to speak, severe coughing)      Wheezing, inhaler is not working       3. PATTERN "Does the difficult breathing come and go, or has it been constant since it started?"      constant 4. SEVERITY: "How bad is your breathing?" (e.g., mild, moderate, severe)    - MILD: No SOB at rest, mild SOB with walking, speaks normally in sentences, can lay down, no retractions, pulse < 100.    - MODERATE: SOB at rest, SOB with minimal exertion and prefers to sit, cannot lie down flat, speaks in phrases, mild retractions, audible wheezing, pulse 100-120.    - SEVERE: Very SOB at rest, speaks in single words, struggling to breathe, sitting hunched forward, retractions, pulse > 120      severe 5. RECURRENT SYMPTOM: "Have you had difficulty breathing before?" If so, ask: "When was the last time?" and "What happened that time?"      *No Answer* 6. CARDIAC HISTORY: "Do you have any history of heart disease?" (e.g., heart attack, angina, bypass surgery, angioplasty)      *No Answer* 7. LUNG HISTORY: "Do you have any history of lung disease?"  (e.g., pulmonary embolus, asthma, emphysema)     *No Answer* 8. CAUSE: "What do you think is causing the breathing problem?"      *No Answer* 9. OTHER SYMPTOMS: "Do you have any other symptoms? (e.g., dizziness, runny nose, cough, chest pain, fever)     *No Answer* 10. PREGNANCY: "Is there any chance you are pregnant?"  "When was your last menstrual period?"       *No Answer* 11. TRAVEL: "Have you traveled out of the country in the last month?" (e.g., travel history, exposures)       *No Answer*  Protocols used: BREATHING DIFFICULTY-A-AH

## 2019-07-17 NOTE — Telephone Encounter (Signed)
Agree with ER for oxygen of 79. ASAP

## 2019-07-17 NOTE — Telephone Encounter (Signed)
Called patient, no answer. Looked in patient's chart and she is at the ER now. Routing to provider as Juluis Rainier.

## 2019-07-17 NOTE — ED Triage Notes (Signed)
Pt comes via POV from home with c/o SOB. Pt also states she fell Sunday. Pt states rib pain and pain under left breast.  Pt states this all started after she fell. Pt states severe pain and unable to sleep.  Pt states some chest pain.

## 2019-07-24 DIAGNOSIS — C50312 Malignant neoplasm of lower-inner quadrant of left female breast: Secondary | ICD-10-CM | POA: Diagnosis not present

## 2019-07-25 ENCOUNTER — Encounter: Payer: Self-pay | Admitting: Family Medicine

## 2019-07-25 ENCOUNTER — Ambulatory Visit (INDEPENDENT_AMBULATORY_CARE_PROVIDER_SITE_OTHER): Payer: Medicare Other | Admitting: Family Medicine

## 2019-07-25 ENCOUNTER — Other Ambulatory Visit: Payer: Self-pay

## 2019-07-25 VITALS — BP 115/65 | HR 81 | Temp 97.5°F | Ht 65.0 in | Wt 210.0 lb

## 2019-07-25 DIAGNOSIS — J90 Pleural effusion, not elsewhere classified: Secondary | ICD-10-CM | POA: Diagnosis not present

## 2019-07-25 DIAGNOSIS — R5383 Other fatigue: Secondary | ICD-10-CM | POA: Diagnosis not present

## 2019-07-25 DIAGNOSIS — N3001 Acute cystitis with hematuria: Secondary | ICD-10-CM | POA: Diagnosis not present

## 2019-07-25 DIAGNOSIS — E118 Type 2 diabetes mellitus with unspecified complications: Secondary | ICD-10-CM | POA: Diagnosis not present

## 2019-07-25 DIAGNOSIS — I200 Unstable angina: Secondary | ICD-10-CM

## 2019-07-25 DIAGNOSIS — IMO0002 Reserved for concepts with insufficient information to code with codable children: Secondary | ICD-10-CM

## 2019-07-25 DIAGNOSIS — E1165 Type 2 diabetes mellitus with hyperglycemia: Secondary | ICD-10-CM

## 2019-07-25 DIAGNOSIS — N3941 Urge incontinence: Secondary | ICD-10-CM

## 2019-07-25 MED ORDER — CIPROFLOXACIN HCL 500 MG PO TABS: 500.0000 mg | ORAL_TABLET | Freq: Two times a day (BID) | ORAL | 0 refills | Status: DC

## 2019-07-25 MED ORDER — METFORMIN HCL ER 500 MG PO TB24: 500.0000 mg | ORAL_TABLET | Freq: Every day | ORAL | 2 refills | Status: DC

## 2019-07-25 NOTE — Progress Notes (Signed)
BP 115/65 (BP Location: Left Arm, Patient Position: Sitting, Cuff Size: Normal)   Pulse 81   Temp (!) 97.5 F (36.4 C) (Oral)   Ht 5\' 5"  (1.651 m)   Wt 210 lb (95.3 kg)   SpO2 97%   BMI 34.95 kg/m    Subjective:    Patient ID: Tammy [name], female    DOB: 1945-11-12, 74 y.o.   MRN: [id number]  HPI: Tammy [name] is a 74 y.o. female  Chief Complaint  Patient presents with  . Hospitalization Follow-up    listen to lungs   ER FOLLOW UP Time since discharge: 11 days  Hospital/facility: [facility] Diagnosis: pleural effusion Procedures/tests: CXR, CT head, rib x-ray Consultants: none New medications: none  Discharge instructions: follow up here   Status: better  Has no energy. Feeling very tired. Gets dizzy. Taking a shower takes 2-3 hours to get dressed and showered because she is really just not feeling well at all.   URINARY SYMPTOMS Duration: couple of weeks, never felt better Dysuria: yes Urinary frequency: yes Urgency: yes Small volume voids: yes Symptom severity: moderate- not feeling well Urinary incontinence: yes Foul odor: yes Hematuria: no Abdominal pain: no Back pain: no Suprapubic pain/pressure: no Flank pain: no Fever:  no Vomiting: no Relief with cranberry juice: no Relief with pyridium: no Status: stable- no better Previous urinary tract infection: yes Recurrent urinary tract infection: no History of sexually transmitted disease: no Vaginal discharge: no Treatments attempted: antibiotics and increasing fluids    Relevant past medical, surgical, family and social history reviewed and updated as indicated. Interim medical history since our last visit reviewed. Allergies and medications reviewed and updated.  Review of Systems  Constitutional: Positive for activity change and fatigue. Negative for appetite change, chills, diaphoresis, fever and unexpected weight change.  HENT: Negative.   Eyes: Negative.   Respiratory: Negative.   Cardiovascular:  Negative.   Gastrointestinal: Negative.   Genitourinary: Positive for dysuria, frequency and urgency. Negative for decreased urine volume, difficulty urinating, dyspareunia, enuresis, flank pain, genital sores, hematuria, menstrual problem, pelvic pain, vaginal bleeding, vaginal discharge and vaginal pain.  Musculoskeletal: Positive for back pain and myalgias. Negative for arthralgias, gait problem, joint swelling, neck pain and neck stiffness.  Skin: Negative.   Neurological: Negative.   Psychiatric/Behavioral: Negative.     Per HPI unless specifically indicated above     Objective:    BP 115/65 (BP Location: Left Arm, Patient Position: Sitting, Cuff Size: Normal)   Pulse 81   Temp (!) 97.5 F (36.4 C) (Oral)   Ht 5\' 5"  (1.651 m)   Wt 210 lb (95.3 kg)   SpO2 97%   BMI 34.95 kg/m   Wt Readings from Last 3 Encounters:  07/25/19 210 lb (95.3 kg)  07/17/19 210 lb (95.3 kg)  07/15/19 210 lb (95.3 kg)    Physical Exam Vitals and nursing note reviewed.  Constitutional:      General: She is not in acute distress.    Appearance: Normal appearance. She is not ill-appearing, toxic-appearing or diaphoretic.  HENT:     Head: Normocephalic and atraumatic.     Right Ear: External ear normal.     Left Ear: External ear normal.     Nose: Nose normal.     Mouth/Throat:     Mouth: Mucous membranes are moist.     Pharynx: Oropharynx is clear.  Eyes:     General: No scleral icterus.       Right eye:  No discharge.        Left eye: No discharge.     Extraocular Movements: Extraocular movements intact.     Conjunctiva/sclera: Conjunctivae normal.     Pupils: Pupils are equal, round, and reactive to light.  Cardiovascular:     Rate and Rhythm: Normal rate and regular rhythm.     Pulses: Normal pulses.     Heart sounds: Normal heart sounds. No murmur. No friction rub. No gallop.   Pulmonary:     Effort: Pulmonary effort is normal. No respiratory distress.     Breath sounds: Normal breath  sounds. No stridor. No wheezing, rhonchi or rales.  Chest:     Chest wall: No tenderness.  Musculoskeletal:        General: Normal range of motion.     Cervical back: Normal range of motion and neck supple.  Skin:    General: Skin is warm and dry.     Capillary Refill: Capillary refill takes less than 2 seconds.     Coloration: Skin is not jaundiced or pale.     Findings: No bruising, erythema, lesion or rash.  Neurological:     General: No focal deficit present.     Mental Status: She is alert and oriented to person, place, and time. Mental status is at baseline.  Psychiatric:        Mood and Affect: Mood normal.        Behavior: Behavior normal.        Thought Content: Thought content normal.        Judgment: Judgment normal.     Results for orders placed or performed in visit on 07/25/19  Microscopic Examination   URINE  Result Value Ref Range   WBC, UA >30 (A) 0 - 5 /hpf   RBC 11-30 (A) 0 - 2 /hpf   Epithelial Cells (non renal) 0-10 0 - 10 /hpf   Bacteria, UA Many (A) None seen/Few  Urine Culture, Reflex   URINE  Result Value Ref Range   Urine Culture, Routine WILL FOLLOW   UA/M w/rflx Culture, Routine   Specimen: Urine   URINE  Result Value Ref Range   Specific Gravity, UA 1.010 1.005 - 1.030   pH, UA 6.0 5.0 - 7.5   Color, UA Yellow Yellow   Appearance Ur Cloudy (A) Clear   Leukocytes,UA 2+ (A) Negative   Protein,UA 1+ (A) Negative/Trace   Glucose, UA 3+ (A) Negative   Ketones, UA Negative Negative   RBC, UA 2+ (A) Negative   Bilirubin, UA Negative Negative   Urobilinogen, Ur 0.2 0.2 - 1.0 mg/dL   Nitrite, UA Negative Negative   Microscopic Examination See below:    Urinalysis Reflex Comment       Assessment & Plan:   Problem List Items Addressed This Visit      Endocrine   Uncontrolled type 2 diabetes mellitus with complication, without long-term current use of insulin ([place])    Not under good concern. Likely contributing to her UTIs. We will start  her back on low dose metformin and monitor her kidney function closely to make sure no change. Check BMP in 2 weeks. Call with any concerns. Continue to monitor.       Relevant Medications   metFORMIN (GLUCOPHAGE-XR) 500 MG 24 hr tablet   Other Relevant Orders   Basic metabolic panel    Other Visit Diagnoses    Pleural effusion    -  Primary   Lungs clear today.  Will repeat CXR to confirm resolution. Await results.    Relevant Orders   DG Chest 2 View   Acute cystitis with hematuria       Will treat with cipro. Continue to monitor. Await culture. To see urology in 1 week. Call with any concerns.    Urge incontinence of urine       +UTI   Relevant Orders   UA/M w/rflx Culture, Routine (Completed)   Fatigue, unspecified type       Likely due to her UTI and sugars. Will treat as above. If not getting better by Friday will let us know.       Follow up plan: Return Labs 2 weeks, 4 weeks with me.

## 2019-07-27 NOTE — Assessment & Plan Note (Signed)
Not under good concern. Likely contributing to her UTIs. We will start her back on low dose metformin and monitor her kidney function closely to make sure no change. Check BMP in 2 weeks. Call with any concerns. Continue to monitor.

## 2019-07-29 ENCOUNTER — Telehealth: Payer: Self-pay | Admitting: Cardiovascular Disease

## 2019-07-29 DIAGNOSIS — M1711 Unilateral primary osteoarthritis, right knee: Secondary | ICD-10-CM | POA: Diagnosis not present

## 2019-07-29 LAB — UA/M W/RFLX CULTURE, ROUTINE
Bilirubin, UA: NEGATIVE
Ketones, UA: NEGATIVE
Nitrite, UA: NEGATIVE
Specific Gravity, UA: 1.01 (ref 1.005–1.030)
Urobilinogen, Ur: 0.2 mg/dL (ref 0.2–1.0)
pH, UA: 6 (ref 5.0–7.5)

## 2019-07-29 LAB — MICROSCOPIC EXAMINATION: WBC, UA: >30 /hpf — AB (ref 0–5)

## 2019-07-29 LAB — URINE CULTURE, REFLEX

## 2019-07-29 NOTE — Telephone Encounter (Signed)
   [facility] Pre-operative Risk Assessment    HEARTCARE STAFF: - Please ensure there is not already an duplicate clearance open for this procedure - Under Visit Info/Reason for Call, type in Other and utilize the format Clearance MM/DD/YY or Clearance TBD  Request for surgical clearance:  1. What type of surgery is being performed? RT TKA traditional knee   2. When is this surgery scheduled? 09/04/19  3. What type of clearance is required (medical clearance vs. Pharmacy clearance to hold med vs. Both)? both  4. Are there any medications that need to be held prior to surgery and how long? Not listed, please advise if needed.  5. Practice name and name of physician performing surgery? [facility] - [place] - Dr Kurtis Bushman   6. What is the office phone number? [phone] ext [phone]   7.   What is the office fax number? [place]   Anesthesia type (None, local, MAC, general) ? Local/spinal   Ace Gins 07/29/2019, 4:05 PM  _________________________________________________________________   (provider comments below)

## 2019-07-30 NOTE — Telephone Encounter (Signed)
Dr. [name]   Could you weigh in on this patient undergoing right TKR 08/2018? She recently underwent LHC that was essentially unchanged and actually improved from prior cath. She saw Kaitlin after the cath and was doing well. I spoke with her on the phone today and she has had no recurrent symptoms. She is on low dose Eliquis for hx of PE/DVT and therefore is not on ASA. Wanted to know your thoughts regarding taking her off this without any antiplatelet in the setting of CAD with CTO?   Please send your recommendations to the [facility] pre op pool.    Thank you Sharee Pimple

## 2019-07-30 NOTE — Telephone Encounter (Signed)
We probably need a office visit with myself or APP for preoperative evaluation Recently seen in the emergency room 10 days ago for shortness of breath Surgery not for over a month

## 2019-08-01 ENCOUNTER — Ambulatory Visit (INDEPENDENT_AMBULATORY_CARE_PROVIDER_SITE_OTHER): Payer: Medicare Other | Admitting: Physician Assistant

## 2019-08-01 ENCOUNTER — Ambulatory Visit (INDEPENDENT_AMBULATORY_CARE_PROVIDER_SITE_OTHER): Payer: Medicare Other | Admitting: Urology

## 2019-08-01 ENCOUNTER — Encounter: Payer: Self-pay | Admitting: Urology

## 2019-08-01 ENCOUNTER — Other Ambulatory Visit: Payer: Self-pay

## 2019-08-01 ENCOUNTER — Encounter: Payer: Self-pay | Admitting: Physician Assistant

## 2019-08-01 VITALS — BP 126/77 | HR 87 | Ht 66.0 in | Wt 210.0 lb

## 2019-08-01 VITALS — BP 98/60 | HR 74 | Ht 66.0 in | Wt 210.0 lb

## 2019-08-01 DIAGNOSIS — R32 Unspecified urinary incontinence: Secondary | ICD-10-CM

## 2019-08-01 DIAGNOSIS — N189 Chronic kidney disease, unspecified: Secondary | ICD-10-CM

## 2019-08-01 DIAGNOSIS — I77819 Aortic ectasia, unspecified site: Secondary | ICD-10-CM

## 2019-08-01 DIAGNOSIS — I739 Peripheral vascular disease, unspecified: Secondary | ICD-10-CM

## 2019-08-01 DIAGNOSIS — I10 Essential (primary) hypertension: Secondary | ICD-10-CM

## 2019-08-01 DIAGNOSIS — Z01810 Encounter for preprocedural cardiovascular examination: Secondary | ICD-10-CM

## 2019-08-01 DIAGNOSIS — Z6833 Body mass index (BMI) 33.0-33.9, adult: Secondary | ICD-10-CM

## 2019-08-01 DIAGNOSIS — Z87898 Personal history of other specified conditions: Secondary | ICD-10-CM

## 2019-08-01 DIAGNOSIS — Z86711 Personal history of pulmonary embolism: Secondary | ICD-10-CM

## 2019-08-01 DIAGNOSIS — N3941 Urge incontinence: Secondary | ICD-10-CM

## 2019-08-01 DIAGNOSIS — W19XXXA Unspecified fall, initial encounter: Secondary | ICD-10-CM | POA: Diagnosis not present

## 2019-08-01 DIAGNOSIS — R0600 Dyspnea, unspecified: Secondary | ICD-10-CM | POA: Diagnosis not present

## 2019-08-01 DIAGNOSIS — R55 Syncope and collapse: Secondary | ICD-10-CM

## 2019-08-01 DIAGNOSIS — J449 Chronic obstructive pulmonary disease, unspecified: Secondary | ICD-10-CM

## 2019-08-01 DIAGNOSIS — Z87891 Personal history of nicotine dependence: Secondary | ICD-10-CM

## 2019-08-01 DIAGNOSIS — I700 Atherosclerosis of aorta: Secondary | ICD-10-CM

## 2019-08-01 DIAGNOSIS — R42 Dizziness and giddiness: Secondary | ICD-10-CM

## 2019-08-01 DIAGNOSIS — I2510 Atherosclerotic heart disease of native coronary artery without angina pectoris: Secondary | ICD-10-CM | POA: Diagnosis not present

## 2019-08-01 LAB — URINALYSIS, COMPLETE
Bilirubin, UA: NEGATIVE
Ketones, UA: NEGATIVE
Nitrite, UA: NEGATIVE
Specific Gravity, UA: 1.015 (ref 1.005–1.030)
Urobilinogen, Ur: 0.2 mg/dL (ref 0.2–1.0)
pH, UA: 6.5 (ref 5.0–7.5)

## 2019-08-01 LAB — MICROSCOPIC EXAMINATION: WBC, UA: >30 /hpf — AB (ref 0–5)

## 2019-08-01 LAB — BLADDER SCAN AMB NON-IMAGING: Scan Result: 13

## 2019-08-01 MED ORDER — LOSARTAN POTASSIUM 25 MG PO TABS: 12.5000 mg | ORAL_TABLET | Freq: Every day | ORAL | 3 refills | Status: DC

## 2019-08-01 NOTE — Progress Notes (Addendum)
Office Visit    Patient Name: Tammy [name] Date of Encounter: 08/01/2019  Primary Care Provider:  [name], DO Primary Cardiologist:  Tammy Rogue, MD  Chief Complaint    Chief Complaint  Patient presents with  . OTHER    Cardiac clearance. Meds reviewed verbally with pt.    74 yo female with history of nonobstructive CAD by 03/2019 LHC, PAD s/p bilateral SFA angioplasty (R side 2012, L in 2013) by [facility], b/l carotid artery dz (2017 [place] <50% bilaterally), aortic atherosclerosis & enlarged thoracic aorta by 07/2019 CT (3.6 x 3.4 cm), COPD, former tobacco use (quit 2010), history of 2015 PE now on maintenance Eliquis 2.24m BID, increased size L lobe lung nodules x2 by recent 07/2019 CT (7672m& 72m71mrepeat CT ~ 10/2019), L sided invasive ductal carcinoma 08/2018 and L partial mastectomy / L side radiation tx early 2021, TIA / CVA with L frontal anterior MCA cortical infarct, R sided sx (vision changes / R HA / R tinnitus / RLE weakness), history of dizziness, h/o  falls, back pain & lumbar radiculopathy s/p surgery 03/2012 and stimulator (2018), CKD, DM2, HTN, HLD, elevated BMI 33-34, bilateral hand tremor, B12 / vitamin D deficiency, chronic post nasal drip with recent URI, recent UTI with hematuria, and here today for preoperative cardiac evaluation for R sided TKA on 09/04/19 with report of same day fall in bathroom (without LOC and during which time she did not hit her head).  Past Medical History    Past Medical History:  Diagnosis Date  . Asthma   . Benign neoplasm of colon   . Breast cancer ([place]   left breast  . Cervical disc disease    "bulging" - no limitations per pt  . Chronic diastolic CHF (congestive heart failure) ([place]  a. 07/2012 Echo: Nl EF. mild inferoseptal HK, Gr 2 DD, mild conc LVH, mild PR/TR, mild PAH.  . CKD (chronic kidney disease), stage III   . COPD (chronic obstructive pulmonary disease) ([place] . Coronary artery disease    a. 11/2011 Cath: LAD  84m172mLCX min irregs, RCA 70p, 100m 472m L->R collats, EF 60%-->Med Rx.  . Diabetes mellitus without complication (HCC) [place] Fusion of lumbar spine 12/22/2015  . GERD (gastroesophageal reflux disease)   . History of DVT (deep vein thrombosis)   . History of tobacco abuse    a. Quit 2011.  . HypMarland Kitchenrlipidemia   . Hypertension   . Hypothyroidism   . Obesity   . Palpitations   . PE (pulmonary embolism) 10/15  . Personal history of radiation therapy   . PONV (postoperative nausea and vomiting)   . Pulmonary embolus (HCC) [place]/2015   RLL. Presented with SOB and elevated d-dimer.   . PVDMarland Kitchen(peripheral vascular disease) (HCC) [place]. PTA of right leg and left femoral artery with stenosis.  . Renal insufficiency   . Stroke (HCC)[facility]   TIAs - no deficits   Past Surgical History:  Procedure Laterality Date  . ANGIOPLASTY / STENTING FEMORAL     PVD; angioplasty right leg and left femoral artery with stenosis.   . APPENDECTOMY  Oct. 2016  . BACK SURGERY  03/2012   nerve stimulator inserted  . BACK SURGERY     screws, rods replaced with new hardware.  . BACMarland Kitchen SURGERY  11/2016  . BREAST LUMPECTOMY Left 08/26/2018  . BREAST SURGERY    . CARDIAC CATHETERIZATION  12/07/2011  Mid LAD 50%, distal LAD 50%, mid RCA 70%, distal RCA 100%   . CARDIAC CATHETERIZATION  01/04/2011   100% occluded mid RCA with good collaterals from distal LAD, normal LVEF.  [name] CHOLECYSTECTOMY    . COLONOSCOPY  2013  . COLONOSCOPY WITH PROPOFOL N/A 05/24/2015   Procedure: COLONOSCOPY WITH PROPOFOL;  Surgeon: Tammy Lame, MD;  Location: [place];  Service: Endoscopy;  Laterality: N/A;  Diabetic - oral meds  . COLONOSCOPY WITH PROPOFOL N/A 01/28/2019   Procedure: COLONOSCOPY WITH PROPOFOL;  Surgeon: Tammy Manifold, MD;  Location: [facility];  Service: Endoscopy;  Laterality: N/A;  . DIAGNOSTIC LAPAROSCOPY    . ESOPHAGOGASTRODUODENOSCOPY (EGD) WITH PROPOFOL N/A 01/28/2019   Procedure:  ESOPHAGOGASTRODUODENOSCOPY (EGD) WITH PROPOFOL;  Surgeon: Tammy Manifold, MD;  Location: [facility];  Service: Endoscopy;  Laterality: N/A;  . LAPAROSCOPIC APPENDECTOMY N/A 01/06/2015   Procedure: APPENDECTOMY LAPAROSCOPIC;  Surgeon: Tammy Lye, MD;  Location: [facility];  Service: General;  Laterality: N/A;  . LEFT HEART CATH AND CORONARY ANGIOGRAPHY Left 04/04/2019   Procedure: LEFT HEART CATH AND CORONARY ANGIOGRAPHY;  Surgeon: Tammy Merritts, MD;  Location: [place] [facility];  Service: Cardiovascular;  Laterality: Left;  [name] MASTECTOMY    . MASTECTOMY W/ SENTINEL NODE BIOPSY Left 08/26/2018   Procedure: PARTIAL MASTECTOMY WIDE EXCISION WITH SENTINEL LYMPH NODE BIOPSY LEFT;  Surgeon: Tammy Bellow, MD;  Location: [facility];  Service: General;  Laterality: Left;  . POLYPECTOMY  05/24/2015   Procedure: POLYPECTOMY;  Surgeon: Tammy Lame, MD;  Location: [place];  Service: Endoscopy;;  . THYROIDECTOMY    . TRIGGER FINGER RELEASE      Allergies  Allergies  Allergen Reactions  . Hydrocodone Other (See Comments)    Unresponsive  . Aleve [Naproxen Sodium]     Hives & itching   . Contrast Media [Iodinated Diagnostic Agents]     Pt avoids due to kidney issues  . Ioxaglate Other (See Comments)    (iodine) Pt avoids due to kidney issues   . Oxycodone Other (See Comments)    hallucinations  . Ranexa [Ranolazine]     Sick on stomach  . Sulfa Antibiotics Other (See Comments)    Pt avoids due to kidney issues  . Tramadol Other (See Comments)    nightmares    History of Present Illness    Tammy [name] is a 74 y.o. female with complex PMH as above.  She has known history of CAD with most recent 03/2019 cath as below with collaterals noted and recommendation for medical management, PAD followed by VVS with angioplasty of bilateral lower extremities (R SFA PTA 2012; L SFA 2013) and recent abnormal bilateral ABIs as below in CV studies, b/l carotid dz, known  bilateral carotid dz as below in CV studies, aortic enlargement as below, COPD with increased size of pulmonary nodules x2 and recommendation for repeat CT 10/2019, s/p PE 12/2013 on maintenance dose Eliquis 2.48m BID to prevent recurrence, history of CVA/TIA as outlined below with L frontal anterior division middle cerebral artery cortical infarct [facility], hypertension, back pain and weakness s/p surgery 03/2012 with rods and spine stimultor, remote smoking history (quit 2010), elevated BMI, CKD (5/6 Cr 1.78), DM2, and history of breast cancer s/p partial L mastectomy and radiation tx.    2013 LHC showed LAD 50%s, pRCA 70%s, mid RCA 100%s.  She had collaterals noted with recommendation for medical management.    09/2018 vascular studies  as below with mild R arterial dz / reduced toe brachial index < moderate left sided arterial dz and moderately reduced toe brachial index (s/p bilateral SFA RSFA:2012/ LSFA:2013). She reports she is followed by vascular surgery. When last seen by Dr. Lucky Cowboy in 09/2018, she noted some back pain and LE pain from neuropathic issues. Her ABIs were decreased from her previous study but waveforms strong and biphasic with toe brachial indices noted to be higher. No changes were made and recommendation was for recheck in 1 year.  03/28/2018 MPI was without significant ischemia, EF 73%, and ruled a low risk scan.    03/15/2019: Seen by PCP with back pain and URI and started on Augmentin and prednisone.    03/31/2019: Called with chest pain since 03/28/2019 and, per daughter, slurred speech.  She declined the ED.    04/02/2019 Seen in office for chest pain and reported fatigue for a number of months but progressing over the last couple of weeks.  She also reported right leg pain and weakness when standing.  She had experienced multiple falls over the previous 2 weeks with concern for neurologic component, given her daughter's report of slurred speech the previous weekend.  Given CP scheduled for LHC.  04/04/19 LHC showed stable mild to moderate CAD with mid LAD disease and known occluded RCA with collaterals from left to right. / It was noted to actually appear improved when compared with previous studies.  Recommendation was for medical management with GDMT.  04/08/2019 she saw her PCP with GI sx and provided with sucralfate.  04/09/2019: Seen in clinic with atypical brief twinges of chest pain and malaise/fatigue with ongoing back pain.  R groin cath site inspected and healing appropriately.  No medication changes or further work-up recommended.   She was seen via telemedicine by neurology / Dr. Manuella Ghazi 06/17/19 given history of CVA/TIA and reported dizziness every day with ongoing R sided sx including R sided pulsatile tinnitus / "wooshing", R sided headache (above her R eye), R sided vision changes (blurry / double vision), ongoing R leg weakness and recent fall despite use of cane. She noted her dizziness / instability was made worse with walking. She also reported bilateral upper extremity hand tremor (with typing) and ongoing post nasal drip s/p abx for URI earlier in the year.  She was receiving cortisone injections for knee pain with some relief. No h/o migraines. It was noted that 09/2018 imagining showed stable ischemic brain injury as described above. She had tried but discontinued meclizine 2/2 fatigue. During her 06/17/19 visit, and given her sx, her BB was discontinued though still noted on medications today (will need to clarify once home with medications).  On 07/17/2019, she presented to the emergency department with report of recent fall that previous Saturday (5/1).  She also reported left-sided rib pain s/p this fall and shortness of breath since that time.  She was unable to sleep well.  Vitals were significant for SpO2 92% and soft BP at 112/56.  EKG was without acute changes.  Labs showed glucose 225, creatinine 1.78, BUN 25, calcium 8.8.  CT chest without  contrast was performed and showed 2 nodules: a 7 mm subpleural nodule of the left upper lobe that was increased in size when compared with prior imaging from 02/2018 and a 3 mm pulmonary nodule of the adjacent peripheral left upper lobe also increased in size when compared with prior imaging.  Recommendation repeat CT scan in 3 to 6 months (~10/2019).  Also  noted was emphysema, CAD, and aortic enlargement and atherosclerosis (thoracic aorta 3.6 x 3.4 cm). CXR showed small L pleural effusion not seen by CT, though CT noted bibasilar scarring or atelectasis. She was discharged and followed up with her PCP.  On 5/14, she saw her PCP and noted fatigue and dizziness. She also noted dysuria, frequency, hematuria, and urgency. Recommendation was for abx for acute cystitis and to follow-up with urology.    Today, she presents to clinic for preoperative cardiac evaluation pending right side TKA.  She reports a history of falls with most recent fall occurring earlier this morning and while in the restroom.  She reports that she felt her legs give way and grabbed onto the bathroom sink; therefore, she did not hit her head.  She denied CP, racing heart rate, or palpitations prior to her fall.  She is not certain if dizziness occurred before this most recent fall but does attribute it to her leg weakness and instability. No visual changes, HA, or aura prior to fall. No LOC. BP in clinic today soft with patient denying any symptoms with SBP 98 (see A/P below).  She notes taking her BB today with neuro notes indicating this was discontinued. No recent SOB/DOE (since recent ED trip above), orthopnea, PND, abdominal distention, weight gain, or early satiety. During preoperative interview, she feels capable of walking 1 block on level ground or going up a flight of stairs without the need for rest due to CP or DOE (and if her knee pain was not a limiting factor). In addition to ongoing b/l leg weakness (worse on R) and R sided  pain, she also notes intermittent cramping (worse on L), chronic asx edema since 2012 (L>R), bilateral coolness (worse on L lateral foot), and bilateral cyanosis.  She hopes to reschedule her vascular surgery appointment  to occur before her R TKA. She notes ongoing fatigue. She remains on 2.28m BID [place]qius with compliance reported and no signs or symptoms of bleeding.   Home Medications    Prior to Admission medications   Medication Sig Start Date End Date Taking? Authorizing Provider  anastrozole (ARIMIDEX) 1 MG tablet TAKE ONE TABLET EVERY DAY Patient taking differently: Take 1 mg by mouth daily.  03/25/19  Yes BCammie Sickle MD  apixaban (ELIQUIS) 2.5 MG TABS tablet Take 1 tablet (2.5 mg) by mouth twice daily Patient taking differently: Take 2.5 mg by mouth 2 (two) times daily.  08/29/18  Yes Byrnett, JForest Gleason MD  blood glucose meter kit and supplies KIT Dispense based on patient and insurance preference. Use one time daily as directed, E11.9 04/23/15  Yes Lada, MSatira Anis MD  Calcium Carb-Cholecalciferol (CALCIUM 600 + D) 600-200 MG-UNIT TABS Take 1 tablet by mouth daily.    Yes [provider]  cetirizine (ZYRTEC) 10 MG tablet Take 1 tablet (10 mg total) by mouth daily. 05/27/19  Yes Johnson, Megan P, DO  cholecalciferol (VITAMIN D3) 25 MCG (1000 UT) tablet Take 1,000 Units by mouth daily.   Yes [provider]  ciprofloxacin (CIPRO) 500 MG tablet Take 1 tablet (500 mg total) by mouth 2 (two) times daily. 07/25/19  Yes Johnson, Megan P, DO  Dulaglutide (TRULICITY) 3 MGY/6.5LDSOPN Inject 3 mg into the skin once a week. 05/08/19  Yes Johnson, Megan P, DO  DULoxetine (CYMBALTA) 60 MG capsule TAKE 1 CAPSULE BY MOUTH ONCE DAILY 05/24/19  Yes Johnson, Megan P, DO  gabapentin (NEURONTIN) 300 MG capsule TAKE 1 CAPSULE BY MOUTH  THREE TIMES DAILY Patient taking differently: Take 300-600 mg by mouth See admin instructions. Take 1 capsule (300 mg) by mouth in the morning & take 2  capsules (600 mg) by mouth at night. 10/21/18  Yes Johnson, Megan P, DO  glipiZIDE (GLUCOTROL) 5 MG tablet TAKE ONE TABLET EVERY DAY BEFORE BREAKFAST Patient taking differently: Take 5 mg by mouth daily before breakfast. TAKE ONE TABLET EVERY DAY BEFORE BREAKFAST 02/14/19  Yes Johnson, Megan P, DO  isosorbide mononitrate (IMDUR) 60 MG 24 hr tablet TAKE 1 TABLET BY MOUTH AT BEDTIME AND 1/2 TABLET IN THE MORNING Patient taking differently: Take 30-60 mg by mouth See admin instructions. Take 0.5 tablet (30 mg) by mouth in the morning & take 1 capsule (60 mg) by mouth at night. 12/05/18  Yes Johnson, Megan P, DO  JARDIANCE 25 MG TABS tablet TAKE ONE TABLET 25MG EVERY DAY BEFORE BREAKFAST 07/01/19  Yes Johnson, Megan P, DO  levothyroxine (SYNTHROID) 150 MCG tablet Take 1 tablet (150 mcg total) by mouth daily before breakfast. 04/11/19  Yes Johnson, Megan P, DO  losartan (COZAAR) 25 MG tablet Take 1 tablet (25 mg total) by mouth daily. 07/08/19  Yes Johnson, Megan P, DO  metFORMIN (GLUCOPHAGE-XR) 500 MG 24 hr tablet Take 1 tablet (500 mg total) by mouth daily with breakfast. 07/25/19  Yes Johnson, Megan P, DO  metoprolol succinate (TOPROL-XL) 50 MG 24 hr tablet TAKE 1 TABLET BY MOUTH DAILY WITH OR IMMEDIATLEY FOLLOWING A MEAL 07/08/19  Yes Johnson, Megan P, DO  montelukast (SINGULAIR) 10 MG tablet Take 1 tablet (10 mg total) by mouth at bedtime. 05/27/19  Yes Johnson, Megan P, DO  Multiple Vitamins-Minerals (ZINC PO) Take 50 mg by mouth daily.    Yes [provider]  olopatadine (PATADAY) 0.1 % ophthalmic solution Place 1 drop into both eyes 2 (two) times daily. Patient taking differently: Place 1 drop into both eyes as needed.  05/27/19  Yes Johnson, Megan P, DO  pantoprazole (PROTONIX) 40 MG tablet Take 1 tablet (40 mg total) by mouth daily as needed. 07/08/19  Yes Johnson, Megan P, DO  polyethylene glycol (MIRALAX MIX-IN PAX) 17 g packet Take 17 g by mouth daily. Patient taking differently: Take 17 g by  mouth as needed.  03/05/19  Yes Tammy Manifold, MD  Polyethylene Glycol 400 (BLINK TEARS) 0.25 % SOLN Place 1 drop into both eyes 3 (three) times daily as needed (dry/irritated eyes.).   Yes [provider]  PROAIR HFA 108 (90 Base) MCG/ACT inhaler TAKE 2 PUFFS EVERY 4 HOURS AS NEEDED FORWHEEZING OR SHORTNESS OF BREATH Patient taking differently: Inhale 1-2 puffs into the lungs every 4 (four) hours as needed (wheezing/shortness of breath.).  12/05/18  Yes Johnson, Megan P, DO  rosuvastatin (CRESTOR) 40 MG tablet TAKE 1 TABLET BY MOUTH DAILY Patient taking differently: Take 40 mg by mouth daily.  02/14/19  Yes Johnson, Megan P, DO  trimethoprim (TRIMPEX) 100 MG tablet Take 1 tablet (100 mg total) by mouth daily. 05/27/19  Yes Johnson, Megan P, DO  nitroGLYCERIN (NITROSTAT) 0.4 MG SL tablet Place 1 tablet (0.4 mg total) under the tongue every 5 (five) minutes as needed for chest pain. Patient not taking: Reported on 07/25/2019 06/04/17   Tammy Merritts, MD    Review of Systems    She denies further chest pain, palpitations, pnd, orthopnea, n, v, syncope, increased edema (chronic LLE>RLE edema), weight gain, or early satiety. No further SOB or DOE since ED visit early  May 2021. No s/sx of bleeding. --Reports a fall today (5/21/ early AM) without LOC and during which she did not hit her head. She is unclear if there was  dizziness before this fall, and she attributes the fall more due to LE instability.  --She reports intermittent b/l  lower extremity claudication sx (L>R), asymmetric chronic edema (L>R) LE weakness (R>L), LE coolness (L lateral foot >R whole foot), and lower extremity color change (bilateral cyanosis). She also notes intermittent b/l LE paresthesias (L>R).  --She continues to have back pain.  All other systems reviewed and are otherwise negative except as noted above.  Physical Exam    VS:  BP 98/60 (BP Location: Left Arm, Patient Position: Sitting, Cuff Size: Large)    Pulse 74   Ht 5' 6"  (1.676 m)   Wt 210 lb (95.3 kg)   SpO2 98%   BMI 33.89 kg/m  , BMI Body mass index is 33.89 kg/m. GEN: Well nourished, well developed, in no acute distress. HEENT: normal. Neck: Supple, no JVD, appreciated carotid bruits, or masses. Cardiac: RRR, no murmurs, rubs, or gallops. No clubbing, cyanosis, mild asymmetric LEE (L>R) noted to be chronic finding per patient.  Radials/DP/PT 1+ and equal bilaterally.  LE bilateral foot cyanosis and coolness with delayed reperfusion as below. Respiratory:  Respirations regular and unlabored, bibasilar reduced breath sounds L>R. GI: Soft, nontender, nondistended, BS + x 4. MS: no deformity or atrophy. Skin: dry, no rash. cyanosis of bilateral lower extremities / feet with poor re-perfusion time (greater than +3 seconds) and cool to touch. Neuro:  Strength and sensation are intact. Bilateral hand tremor noted. Psych: Normal affect.  Accessory Clinical Findings    ECG personally reviewed by me today - NSR, 74bpm - no acute changes.  VITALS Reviewed today   Temp Readings from Last 3 Encounters:  07/25/19 (!) 97.5 F (36.4 C) (Oral)  07/17/19 98.2 F (36.8 C)  07/15/19 97.9 F (36.6 C) (Oral)   BP Readings from Last 3 Encounters:  08/01/19 98/60  07/25/19 115/65  07/17/19 125/75   Pulse Readings from Last 3 Encounters:  08/01/19 74  07/25/19 81  07/17/19 88    Wt Readings from Last 3 Encounters:  08/01/19 210 lb (95.3 kg)  07/25/19 210 lb (95.3 kg)  07/17/19 210 lb (95.3 kg)     LABS  reviewed today   CareEverwhere Labs present and most recent? Yes/No: No  Lab Results  Component Value Date   WBC 10.2 07/17/2019   HGB 12.8 07/17/2019   HCT 40.1 07/17/2019   MCV 90.9 07/17/2019   PLT 270 07/17/2019   Lab Results  Component Value Date   CREATININE 1.78 (H) 07/17/2019   BUN 25 (H) 07/17/2019   NA 138 07/17/2019   K 4.5 07/17/2019   CL 103 07/17/2019   CO2 25 07/17/2019   Lab Results  Component  Value Date   ALT 14 07/08/2019   AST 20 07/08/2019   ALKPHOS 105 07/08/2019   BILITOT 0.5 07/08/2019   Lab Results  Component Value Date   CHOL 158 07/08/2019   HDL 50 07/08/2019   LDLCALC 59 07/08/2019   TRIG 319 (H) 07/08/2019   CHOLHDL 3.2 09/07/2015    Lab Results  Component Value Date   HGBA1C 9.8 (H) 07/08/2019   Lab Results  Component Value Date   TSH 4.680 (H) 05/20/2019     STUDIES/PROCEDURES reviewed today   Echo 01/2014 - Left ventricle: The cavity size was normal. Systolic function  was  normal. The estimated ejection fraction was in the range of 55%  to 60%. Wall motion was normal; there were no regional wall  motion abnormalities. Doppler parameters are consistent with  abnormal left ventricular relaxation (grade 1 diastolic  dysfunction).  - Tricuspid valve: There was trivial TR.  - Pulmonary arteries: Systolic pressure was within the normal  range.   12/2015 Bilateral Carotid US IMPRESSION: 1. Mild bilateral diffuse carotid intimal thickening with mild punctate plaque left carotid bifurcation. No flow limiting stenosis. Degree of stenosis less than 50% bilaterally. 2.  Vertebral arteries are patent with antegrade flow.  MPI 03/2018 Pharmacological myocardial perfusion imaging study with no significant Ischemia Small region of fixed defect in the mid anteroseptal wall, likely attenuation artifact though unable to exclude prior infarct Significant GI uptake artifact Normal wall motion, EF estimated at 73% No EKG changes concerning for ischemia at peak stress or in recovery. Low risk scan  09/2018 Arterial study / ABIs Summary:  Right: Resting right ankle-brachial index indicates mild right lower  extremity arterial disease. The right toe-brachial index is abnormal.  Left: Resting left ankle-brachial index indicates moderate left lower  extremity arterial disease. The left toe-brachial index is abnormal. Summary:  Left: Imaging of  the Left Lower Extremity Arterial system was completed;  flow seen throughout.  09/2018 MR MRV head without contrast No acute infarct, hemorrhage, hydrocephalus, mass, or collection. Small remote anterior and lateral left frontal infarct. Mild to moderate small vessel ischemic type change in the cerebral white matter without significant change. Small remote left cerebellar infarct. No specific finding in the brainstem, cisterns, or skull base to explain double vision. Brain volume is normal.  04/04/19 LHC Final Conclusions:   Nonobstructive disease, stable mild to moderate mid LAD disease Known occluded RCA with collaterals from left to right Prior images reviewed from 2013, images today actually looks improved compared to prior study  5/62021 CT without contrast IMPRESSION: 1. No evidence of acute traumatic injury within the chest. No displaced rib fracture 2. There is a 7 mm subpleural nodule of the peripheral left upper lobe which has increased in size compared to prior examination dated 03/01/2018, previously approximately 3 mm (series 2, image 56). There is a 3 mm pulmonary nodule of the adjacent peripheral left upper lobe which has also increased in size compared to prior examination, previously 2 mm (series 2, image 54). Consider follow-up CT in 3-6 months (~10/2019) to ensure stability. 3. Emphysema (ICD10-J43.9). 4. Coronary artery disease. 5. Aortic Atherosclerosis (ICD10-I70.0). Unchanged enlargement of the descending thoracic aorta, measuring up to 3.6 x 3.4 cm.   07/17/19 CXR Heart size within normal limits. Atherosclerotic calcification of the aortic knob. Small left pleural effusion with associated posterior left basilar opacity, which may reflect atelectasis and/or pneumonia. No pneumothorax. Thoracic spinal stimulator leads are seen. IMPRESSION: Small left pleural effusion with associated posterior left basilar opacity, which may reflect atelectasis and/or  pneumonia. Findings similar to prior.  07/15/2019  [place] L breast RECOMMENDATION: Given the findings on mammogram and ultrasound at the site of palpable concern, favored to represent postsurgical changes, a follow-up left breast diagnostic mammogram and possible ultrasound is recommended in 6 months (~01/2020)  to reassess this area.  Assessment & Plan    Preoperative cardiac evaluation:   R sided TKA 08/2019 with local / spinal anesthesia and by [facility] / Dr. Kurtis Bushman.   Office [phone] ex [phone]; fax [phone] Attn Theodora Blow.  --Clinical assessment: No current active cardiac conditions. No  current symptoms of CP or DOE since early May per pt. Attributes fall this AM to LE weakness - unclear if dizziness. EKG with NSR. Euvolemic. --Clinical risk factors: include h/o CVA/TIA.  Recent Cr 1.78 on 07/17/19 and thus less than 2.0. Repeat BMET closer to surgery to reassess. She is not on insulin for her DM2.  --Functional capacity: moderate (6 METS) as can walk 1 level block and climb a flight of stairs but not play doubles tennis. --Surgery specific risk: intermediate or 1-5% surgery specific risk given orthopedic surgery. --Preoperative testing: Recent ischemic workup as above with 03/2019 LHC showing mild to moderate nonobstructive CAD. EKG today without ST/T changes. Given fall with unclear report of dizziness and previous echo >12 months prior, recheck echo to rule out structural change / worsening valvular dz (and as s/p radiation).  Will contact pharmacy per [facility] request with following tentative plan: Hold Eliquis 2.71m twice daily at least 2-3 days before TKA with restart per orthopedic surgery. Consider ASA [id number] during the perioperative period and until able to restart OAAndersonGiven hypotension & Cr, losartan decreased today to 12.71m90maily. Depending on BP response, may need to hold ARB 24h before surgery to prevent perioperative hypotension wit restart once able and  recheck of BMET 1 wk later. Continue BB to prevent sympathetic activation from withdrawal.  *Final preop report / recommendations to be faxed to number above pending pharmacy reivew / updated echo.  Recent Fall, attributed by patient to lower extremity weakness --Fall this AM without LOC or hitting head. Reports 2/2 LE weakness but cannot clarify if dizzy before fall. Consider fall 2/2 PAD or carotid dz, weakness from knee/back, CVA / neuro sx, low BP, intermittent hypoxia / COPD/ lung dz. No racing HR or palpitations and EKG NSR. No indication for Zio at this time. H/o PE but reported compliance with OAC and SpO2 98% without tachycardic rate.  --Update echo to r/o valvular dz s/p radiation to L breast / acute structural change.Decrease losartan given low BP and renal insufficiency. Check BP at home given reduced ARB. Continue on BB.  Hypotension --Reports asx with hypotension today.  --Reports continued BB rather than discontinued after neuro appt and as outlined in HPI.  --Decrease losartan to 12.71mg69mily given low BP and renal insufficiency.  --Preop recommendations as outlined above. --Monitor BP at home.   Vascular dz:  Bilateral Carotid Dz; Bilateral 2012/2013 PAD with SFA intervention; Aortic atherosclerosis by CT 07/2019 --S/p bilateral SFA intervention as above. H/o carotid dz and aortic atherosclerosis by images as above.  --Reports LE claudication, coolness, edema, color change / cyanosis as outlined above in HPI / ROS and noted on exam. Continue to follow with [facility] for PAD.  --No carotid bruit heard on exam or report of amaurosis fugax, and given unclear report of dizziness surrounding fall today, will defer repeat carotid study with low threshold to repeat US c[place]oreaotid if indicated.  --Risk factor modification and medical management with Eliquis in place of ASA. Control blood sugar, HR/BP, and cholesterol. Continue statin with annual recheck of lipids per PCP.   Thoracic aorta  enlargement, stable by 07/2019 CT --Stable by most recent CT. Continue to monitor. Avoid heavy lifting and FQ. Recommend ongoing BP and HR control, as well as cholesterol control. Continue statin and Eliquis (in lieu of ASA).   HLD --Continue current statin. 05/2018 LDL 47. Annual recheck per PCP with goal LDL <70 given CAD and vascular dz.  History of CVA --H/o CVA detailed  above. Most recent brain imaging above. Continue to follow with neuro given history of CVA and ongoing neuro sx as detailed above. As above, patient continued BB after last visit.   Nonobstructive CAD --No current or recent reported CP. 2015 echo with EF nl and pending repeat echo. 03/2019 LHC as copied above with nonobstructive CAD and recommended continued GDMT.   Lung nodules, enlarged on 07/2019 CT scan --Consider as contributing to [place]. CT report with recommendation for repeat CT 10/2019. Will defer scheduling CT to PCP. Reviewed CT and recommendations with pt.  History of PE 2015 on Eliquis  --Reports compliance with OAC. No s/sx of bleeding. No renal adjustment for maintenance dose / PE prevention dose Eliquis. See above regarding [place] and surgery recommendations.  Renal insufficiency --As above, Cr 1.78 on 07/17/19. Recommend recheck BMET closer to procedure. Decreased ARB given current renal function and to prevent prerenal AKI. Renally dose medications, avoid nephrotoxins. Control DM2.   DM2 --Glycemic control recommended for risk factor modification.  History of L sided breast cancer --S/p mastectomy and radiation. Continue with imaging as recommended with copied [place] above recommending repeat US in 6 months and per IM/oncology. Given L breast radiation repeat echo to reassess valves as above.  Medication changes: Decreased losartan to 12.70m daily. Labs ordered: None. Recommendation for BMET per orthopedics closer to surgery. Lipids per PCP. Studies / Imaging ordered: Echo. Recommend repeat CT and breast  [place] radiology report recommendations as above.  Disposition: RTC after R TKA or sooner if significant findings on echo.   JArvil Chaco PA-C 08/01/2019

## 2019-08-01 NOTE — Patient Instructions (Addendum)
Medication Instructions:  Your physician has recommended you make the following change in your medication:  1. DECREASE Losartan 25 mg take 1/2 tablet (12.5 mg) once daily   Hold Eliquis 2 days prior to your procedure and restart per orthopedic surgeon.   *If you need a refill on your cardiac medications before your next appointment, please call your pharmacy*   Lab Work: None  If you have labs (blood work) drawn today and your tests are completely normal, you will receive your results only by: [name] MyChart Message (if you have MyChart) OR . A paper copy in the mail If you have any lab test that is abnormal or we need to change your treatment, we will call you to review the results.   Testing/Procedures: Your physician has requested that you have an echocardiogram. Echocardiography is a painless test that uses sound waves to create images of your heart. It provides your doctor with information about the size and shape of your heart and how well your heart's chambers and valves are working. This procedure takes approximately one hour. There are no restrictions for this procedure.   Follow-Up: At [facility], you and your health needs are our priority.  As part of our continuing mission to provide you with exceptional heart care, we have created designated Provider Care Teams.  These Care Teams include your primary Cardiologist (physician) and Advanced Practice Providers (APPs -  Physician Assistants and Nurse Practitioners) who all work together to provide you with the care you need, when you need it.  Your next appointment:   Follow up after your surgery  The format for your next appointment:   In Person  Provider:    You may see Ida Rogue, MD or one of the following Advanced Practice Providers on your designated Care Team:    Murray Hodgkins, NP  Christell Faith, PA-C  Marrianne Mood, PA-C   Make sure to check Blood Pressures 2 hours after your medications.  Please  keep a log of your readings so we can see how they trend.  Avoid these things for 30 minutes before checking your blood pressure:  Drinking caffeine.  Drinking alcohol.  Eating.  Smoking.  Exercising.  Five minutes before checking your blood pressure:  Pee.  Sit in a dining chair. Avoid sitting in a soft couch or armchair.  Be quiet. Do not talk.    How to Take Your Blood Pressure You can take your blood pressure at home with a machine. You may need to check your blood pressure at home:  To check if you have high blood pressure (hypertension).  To check your blood pressure over time.  To make sure your blood pressure medicine is working. Supplies needed: You will need a blood pressure machine, or monitor. You can buy one at a drugstore or online. When choosing one:  Choose one with an arm cuff.  Choose one that wraps around your upper arm. Only one finger should fit between your arm and the cuff.  Do not choose one that measures your blood pressure from your wrist or finger. Your doctor can suggest a monitor. How to prepare Avoid these things for 30 minutes before checking your blood pressure:  Drinking caffeine.  Drinking alcohol.  Eating.  Smoking.  Exercising. Five minutes before checking your blood pressure:  Pee.  Sit in a dining chair. Avoid sitting in a soft couch or armchair.  Be quiet. Do not talk. How to take your blood pressure Follow the instructions  that came with your machine. If you have a digital blood pressure monitor, these may be the instructions: 1. Sit up straight. 2. Place your feet on the floor. Do not cross your ankles or legs. 3. Rest your left arm at the level of your heart. You may rest it on a table, desk, or chair. 4. Pull up your shirt sleeve. 5. Wrap the blood pressure cuff around the upper part of your left arm. The cuff should be 1 inch (2.5 cm) above your elbow. It is best to wrap the cuff around bare skin. 6. Fit  the cuff snugly around your arm. You should be able to place only one finger between the cuff and your arm. 7. Put the cord inside the groove of your elbow. 8. Press the power button. 9. Sit quietly while the cuff fills with air and loses air. 10. Write down the numbers on the screen. 11. Wait 2-3 minutes and then repeat steps 1-10. What do the numbers mean? Two numbers make up your blood pressure. The first number is called systolic pressure. The second is called diastolic pressure. An example of a blood pressure reading is "120 over 80" (or 120/80). If you are an adult and do not have a medical condition, use this guide to find out if your blood pressure is normal: Normal  First number: below 120.  Second number: below 80. Elevated  First number: 120-129.  Second number: below 80. Hypertension stage 1  First number: 130-139.  Second number: 80-89. Hypertension stage 2  First number: 140 or above.  Second number: 72 or above. Your blood pressure is above normal even if only the top or bottom number is above normal. Follow these instructions at home:  Check your blood pressure as often as your doctor tells you to.  Take your monitor to your next doctor's appointment. Your doctor will: ? Make sure you are using it correctly. ? Make sure it is working right.  Make sure you understand what your blood pressure numbers should be.  Tell your doctor if your medicines are causing side effects. Contact a doctor if:  Your blood pressure keeps being high. Get help right away if:  Your first blood pressure number is higher than 180.  Your second blood pressure number is higher than 120. This information is not intended to replace advice given to you by your health care provider. Make sure you discuss any questions you have with your health care provider. Document Revised: 02/09/2017 Document Reviewed: 08/06/2015 Elsevier Patient Education  2020 [employer].

## 2019-08-03 ENCOUNTER — Encounter: Payer: Self-pay | Admitting: Urology

## 2019-08-03 NOTE — Progress Notes (Signed)
08/01/2019 1:09 PM   Tammy [name] November 16, 1945 [id number]  Referring provider: [name], DO [place],  [place] [address]  Chief Complaint  Patient presents with  . Urinary Incontinence    Urologic history: 1.  Recurrent UTI No recent problems  2.  Right UPJ obstruction -Asymptomatic -Initially diagnosed 2009 -Lasix renogram with 18% function -Saw Dr. Alinda Money for second opinion, no treatment recommended  3.  Overactive bladder -Previously on anticholinergic/beta 3 agonist -Presently off med   HPI: 74 y.o. female presents for evaluation of urge incontinence.  -History of overactive bladder previously on anticholinergic/beta 3 agonist -Presents with 1 month history of worsening urinary frequency, urgency with urge incontinence. -Denies dysuria, gross hematuria -Denies flank, abdominal or pelvic pain. -Scheduled for orthopedic surgery next month   PMH: Past Medical History:  Diagnosis Date  . Asthma   . Benign neoplasm of colon   . Breast cancer ([place]) 08/2018   left breast  . Cervical disc disease    "bulging" - no limitations per pt  . Chronic diastolic CHF (congestive heart failure) ([place])    a. 07/2012 Echo: Nl EF. mild inferoseptal HK, Gr 2 DD, mild conc LVH, mild PR/TR, mild PAH.  . CKD (chronic kidney disease), stage III   . COPD (chronic obstructive pulmonary disease) ([place])   . Coronary artery disease    a. 11/2011 Cath: LAD 66md, LCX min irregs, RCA 70p, 1043mith L->R collats, EF 60%-->Med Rx.  . Diabetes mellitus without complication ([facility] [place]  . Fusion of lumbar spine 12/22/2015  . GERD (gastroesophageal reflux disease)   . History of DVT (deep vein thrombosis)   . History of tobacco abuse    a. Quit 2011.  . Marland Kitchenyperlipidemia   . Hypertension   . Hypothyroidism   . Obesity   . Palpitations   . PE (pulmonary embolism) 10/15  . Personal history of radiation therapy   . PONV (postoperative nausea and vomiting)   . [place]monary embolus ([place] 12/25/2013   RLL. Presented with SOB and elevated d-dimer.   . Marland KitchenVD (peripheral vascular disease) ([facility] [place]   a. PTA of right leg and left femoral artery with stenosis.  . Renal insufficiency   . [facility]   TIAs - no deficits    Surgical History: Past Surgical History:  Procedure Laterality Date  . ANGIOPLASTY / STENTING FEMORAL     PVD; angioplasty right leg and left femoral artery with stenosis.   . APPENDECTOMY  Oct. 2016  . BACK SURGERY  03/2012   nerve stimulator inserted  . BACK SURGERY     screws, rods replaced with new hardware.  . Marland KitchenACK SURGERY  11/2016  . BREAST LUMPECTOMY Left 08/26/2018  . BREAST SURGERY    . CARDIAC CATHETERIZATION  12/07/2011   Mid LAD 50%, distal LAD 50%, mid RCA 70%, distal RCA 100%   . CARDIAC CATHETERIZATION  01/04/2011   100% occluded mid RCA with good collaterals from distal LAD, normal LVEF.  . Marland KitchenHOLECYSTECTOMY    . COLONOSCOPY  2013  . COLONOSCOPY WITH PROPOFOL N/A 05/24/2015   Procedure: COLONOSCOPY WITH PROPOFOL;  Surgeon: DaLucilla LameMD;  [facility];  Laterality: N/A;  Diabetic - oral meds  . COLONOSCOPY WITH PROPOFOL N/A 01/28/2019   Procedure: COLONOSCOPY WITH PROPOFOL;  Surgeon: TaVirgel ManifoldMD;  Location: [facility] ENDOSCOPY;  Service: Endoscopy;  Laterality: N/A;  . DIAGNOSTIC LAPAROSCOPY    . ESOPHAGOGASTRODUODENOSCOPY (EGD) WITH  PROPOFOL N/A 01/28/2019   Procedure: ESOPHAGOGASTRODUODENOSCOPY (EGD) WITH PROPOFOL;  Surgeon: [name], MD;  Location: [facility] ENDOSCOPY;  Service: Endoscopy;  Laterality: N/A;  . LAPAROSCOPIC APPENDECTOMY N/A 01/06/2015   Procedure: APPENDECTOMY LAPAROSCOPIC;  Surgeon: [name], MD;  Location: [facility] ORS;  Service: General;  Laterality: N/A;  . LEFT HEART CATH AND CORONARY ANGIOGRAPHY Left 04/04/2019   Procedure: LEFT HEART CATH AND CORONARY ANGIOGRAPHY;  Surgeon: [name], MD;  Location: [facility];  Service:  Cardiovascular;  Laterality: Left;  [name] MASTECTOMY    . MASTECTOMY W/ SENTINEL NODE BIOPSY Left 08/26/2018   Procedure: PARTIAL MASTECTOMY WIDE EXCISION WITH SENTINEL LYMPH NODE BIOPSY LEFT;  Surgeon: [name], MD;  Location: [facility] ORS;  Service: General;  Laterality: Left;  . POLYPECTOMY  05/24/2015   Procedure: POLYPECTOMY;  Surgeon: [name], MD;  Location: [facility];  Service: Endoscopy;;  . THYROIDECTOMY    . TRIGGER FINGER RELEASE      Home Medications:  Allergies as of 08/01/2019      Reactions   Hydrocodone Other (See Comments)   Unresponsive   Aleve [naproxen Sodium]    Hives & itching    Contrast Media [iodinated Diagnostic Agents]    Pt avoids due to kidney issues   Ioxaglate Other (See Comments)   (iodine) Pt avoids due to kidney issues   Oxycodone Other (See Comments)   hallucinations   Ranexa [ranolazine]    Sick on stomach   Sulfa Antibiotics Other (See Comments)   Pt avoids due to kidney issues   Tramadol Other (See Comments)   nightmares      Medication List       Accurate as of Aug 01, 2019 11:59 PM. If you have any questions, ask your nurse or doctor.        anastrozole 1 MG tablet Commonly known as: ARIMIDEX TAKE ONE TABLET EVERY DAY   apixaban 2.5 MG Tabs tablet Commonly known as: Eliquis Take 1 tablet (2.5 mg) by mouth twice daily What changed:   how much to take  how to take this  when to take this  additional instructions   Blink Tears 0.25 % Soln Generic drug: Polyethylene Glycol 400 Place 1 drop into both eyes 3 (three) times daily as needed (dry/irritated eyes.).   blood glucose meter kit and supplies Kit Dispense based on patient and insurance preference. Use one time daily as directed, E11.9   Calcium 600 + D 600-200 MG-UNIT Tabs Generic drug: Calcium Carb-Cholecalciferol Take 1 tablet by mouth daily.   cetirizine 10 MG tablet Commonly known as: ZYRTEC Take 1 tablet (10 mg total) by mouth daily.     cholecalciferol 25 MCG (1000 UNIT) tablet Commonly known as: VITAMIN D3 Take 1,000 Units by mouth daily.   ciprofloxacin 500 MG tablet Commonly known as: Cipro Take 1 tablet (500 mg total) by mouth 2 (two) times daily.   DULoxetine 60 MG capsule Commonly known as: CYMBALTA TAKE 1 CAPSULE BY MOUTH ONCE DAILY   gabapentin 300 MG capsule Commonly known as: NEURONTIN TAKE 1 CAPSULE BY MOUTH THREE TIMES DAILY What changed:   how much to take  when to take this  additional instructions   glipiZIDE 5 MG tablet Commonly known as: GLUCOTROL TAKE ONE TABLET EVERY DAY BEFORE BREAKFAST What changed: See the new instructions.   isosorbide mononitrate 60 MG 24 hr tablet Commonly known as: IMDUR TAKE 1 TABLET BY MOUTH AT BEDTIME AND 1/2 TABLET IN THE  MORNING What changed: See the new instructions.   Jardiance 25 MG Tabs tablet Generic drug: empagliflozin TAKE ONE TABLET 25MG EVERY DAY BEFORE BREAKFAST   levothyroxine 150 MCG tablet Commonly known as: SYNTHROID Take 1 tablet (150 mcg total) by mouth daily before breakfast.   losartan 25 MG tablet Commonly known as: COZAAR Take 0.5 tablets (12.5 mg total) by mouth daily. What changed: how much to take Changed by: Arvil Chaco, PA-C   metFORMIN 500 MG 24 hr tablet Commonly known as: GLUCOPHAGE-XR Take 1 tablet (500 mg total) by mouth daily with breakfast.   metoprolol succinate 50 MG 24 hr tablet Commonly known as: TOPROL-XL TAKE 1 TABLET BY MOUTH DAILY WITH OR IMMEDIATLEY FOLLOWING A MEAL   montelukast 10 MG tablet Commonly known as: SINGULAIR Take 1 tablet (10 mg total) by mouth at bedtime.   nitroGLYCERIN 0.4 MG SL tablet Commonly known as: NITROSTAT Place 1 tablet (0.4 mg total) under the tongue every 5 (five) minutes as needed for chest pain.   olopatadine 0.1 % ophthalmic solution Commonly known as: Pataday Place 1 drop into both eyes 2 (two) times daily. What changed:   when to take this  reasons to  take this   pantoprazole 40 MG tablet Commonly known as: PROTONIX Take 1 tablet (40 mg total) by mouth daily as needed.   polyethylene glycol 17 g packet Commonly known as: MiraLax Mix-In Pax Take 17 g by mouth daily. What changed:   when to take this  reasons to take this   ProAir HFA 108 (90 Base) MCG/ACT inhaler Generic drug: albuterol TAKE 2 PUFFS EVERY 4 HOURS AS NEEDED FORWHEEZING OR SHORTNESS OF BREATH What changed:   how much to take  how to take this  when to take this  reasons to take this  additional instructions   rosuvastatin 40 MG tablet Commonly known as: CRESTOR TAKE 1 TABLET BY MOUTH DAILY   trimethoprim 100 MG tablet Commonly known as: TRIMPEX Take 1 tablet (100 mg total) by mouth daily.   Trulicity 3 CW/8.8QB Sopn Generic drug: Dulaglutide Inject 3 mg into the skin once a week.   ZINC PO Take 50 mg by mouth daily.       Allergies:  Allergies  Allergen Reactions  . Hydrocodone Other (See Comments)    Unresponsive  . Aleve [Naproxen Sodium]     Hives & itching   . Contrast Media [Iodinated Diagnostic Agents]     Pt avoids due to kidney issues  . Ioxaglate Other (See Comments)    (iodine) Pt avoids due to kidney issues   . Oxycodone Other (See Comments)    hallucinations  . Ranexa [Ranolazine]     Sick on stomach  . Sulfa Antibiotics Other (See Comments)    Pt avoids due to kidney issues  . Tramadol Other (See Comments)    nightmares    Family History: Family History  Problem Relation Age of Onset  . Hypertension Father   . Heart disease Father   . Heart attack Father   . Diabetes Father   . Cancer Mother        colon  . Heart attack Brother   . Diabetes Brother   . Hypertension Brother   . Heart disease Brother   . Heart attack Brother   . Diabetes Brother   . Hypertension Brother   . Heart disease Brother   . Heart attack Brother   . Diabetes Brother   . Hypertension Brother   .  Heart disease Brother   .  Cancer Sister        lung  . COPD Sister   . COPD Sister   . Breast cancer Neg Hx     Social History:  reports that she quit smoking about 12 years ago. Her smoking use included cigarettes. She has a 7.50 pack-year smoking history. She has never used smokeless tobacco. She reports that she does not drink alcohol or use drugs.   Physical Exam: BP 126/77   Pulse 87   Ht 5' 6"  (1.676 m)   Wt 210 lb (95.3 kg)   BMI 33.89 kg/m   Constitutional:  Alert and oriented, No acute distress. HEENT: [place] AT, moist mucus membranes.  Trachea midline, no masses. Cardiovascular: No clubbing, cyanosis, or edema. Respiratory: Normal respiratory effort, no increased work of breathing. Skin: No rashes, bruises or suspicious lesions. Neurologic: Grossly intact, no focal deficits, moving all 4 extremities. Psychiatric: Normal mood and affect.   Assessment & Plan:    1.  Urge incontinence -PVR by bladder scan 13 cc -Urinalysis with >30 WBCs.  Urine culture ordered -Previously on anticholinergic and beta 3 agonist.  Was given Myrbetriq samples 50 mg   [name], MD  [place] [address], [place], [place] [address] [phone]

## 2019-08-04 ENCOUNTER — Telehealth: Payer: Self-pay | Admitting: Family Medicine

## 2019-08-04 NOTE — Telephone Encounter (Signed)
Called pt and advised of Tammy [name]'s message pt will discuss with Dr Wynetta Emery at upcoming appt.

## 2019-08-04 NOTE — Telephone Encounter (Signed)
Routing to provider  

## 2019-08-04 NOTE — Telephone Encounter (Signed)
Copied from [place] [phone]. Topic: General - Other >> Aug 04, 2019 10:37 AM Leward Quan A wrote: Reason for CRM: Patient called to inform Dr Wynetta Emery that she received a call from [facility] and they say that she need to bring her A1C down but she is having a hard time doing so. Patient is asking Dr Wynetta Emery if there is anything else she can recommend to help her. Please call Ph# [phone]

## 2019-08-04 NOTE — Telephone Encounter (Signed)
Her recent A1C was 9.8%.  Definitely recommend she schedule visit to discuss with Dr. Wynetta Emery.

## 2019-08-04 NOTE — Telephone Encounter (Signed)
Patient has an appt on 08/07/19.

## 2019-08-05 ENCOUNTER — Ambulatory Visit (INDEPENDENT_AMBULATORY_CARE_PROVIDER_SITE_OTHER): Payer: Medicare Other | Admitting: Pharmacist

## 2019-08-05 ENCOUNTER — Encounter: Payer: Self-pay | Admitting: Pharmacist

## 2019-08-05 DIAGNOSIS — E118 Type 2 diabetes mellitus with unspecified complications: Secondary | ICD-10-CM

## 2019-08-05 DIAGNOSIS — E1165 Type 2 diabetes mellitus with hyperglycemia: Secondary | ICD-10-CM | POA: Diagnosis not present

## 2019-08-05 DIAGNOSIS — I129 Hypertensive chronic kidney disease with stage 1 through stage 4 chronic kidney disease, or unspecified chronic kidney disease: Secondary | ICD-10-CM | POA: Diagnosis not present

## 2019-08-05 DIAGNOSIS — IMO0002 Reserved for concepts with insufficient information to code with codable children: Secondary | ICD-10-CM

## 2019-08-05 NOTE — Chronic Care Management (AMB) (Signed)
Chronic Care Management   Follow Up Note   08/05/2019 Name: Tammy [name] MRN: [id number] DOB: 06-14-1945  Referred by: [name], DO Reason for referral : Chronic Care Management (Medication Management)   Tammy [name] is a 74 y.o. year old female who is a primary care patient of [name], DO. The CCM team was consulted for assistance with chronic disease management and care coordination needs.    Contacted patient for medication management review.   Review of patient status, including review of consultants reports, relevant laboratory and other test results, and collaboration with appropriate care team members and the patient's provider was performed as part of comprehensive patient evaluation and provision of chronic care management services.    SDOH (Social Determinants of Health) assessments performed: Yes See Care Plan activities for detailed interventions related to SDOH)  SDOH Interventions     Most Recent Value  SDOH Interventions  Financial Strain Interventions  Other (Comment) [patient assistance screening]       Outpatient Encounter Medications as of 08/05/2019  Medication Sig Note  . anastrozole (ARIMIDEX) 1 MG tablet TAKE ONE TABLET EVERY DAY   . apixaban (ELIQUIS) 2.5 MG TABS tablet Take 1 tablet (2.5 mg) by mouth twice daily   . blood glucose meter kit and supplies KIT Dispense based on patient and insurance preference. Use one time daily as directed, E11.9   . Calcium Carb-Cholecalciferol (CALCIUM 600 + D) 600-200 MG-UNIT TABS Take 1 tablet by mouth daily.    . cetirizine (ZYRTEC) 10 MG tablet Take 1 tablet (10 mg total) by mouth daily.   . cholecalciferol (VITAMIN D3) 25 MCG (1000 UT) tablet Take 1,000 Units by mouth daily.   . Dulaglutide (TRULICITY) 3 OF/7.5ZW SOPN Inject 3 mg into the skin once a week.   . DULoxetine (CYMBALTA) 60 MG capsule TAKE 1 CAPSULE BY MOUTH ONCE DAILY   . gabapentin (NEURONTIN) 300 MG capsule TAKE 1 CAPSULE BY MOUTH THREE  TIMES DAILY (Patient taking differently: Take 300-600 mg by mouth See admin instructions. Take 1 capsule (300 mg) by mouth in the morning & take 2 capsules (600 mg) by mouth at night.)   . glipiZIDE (GLUCOTROL) 5 MG tablet TAKE ONE TABLET EVERY DAY BEFORE BREAKFAST   . isosorbide mononitrate (IMDUR) 60 MG 24 hr tablet TAKE 1 TABLET BY MOUTH AT BEDTIME AND 1/2 TABLET IN THE MORNING (Patient taking differently: Take 30-60 mg by mouth See admin instructions. Take 0.5 tablet (30 mg) by mouth in the morning & take 1 capsule (60 mg) by mouth at night.)   . JARDIANCE 25 MG TABS tablet TAKE ONE TABLET 25MG EVERY DAY BEFORE BREAKFAST   . levothyroxine (SYNTHROID) 150 MCG tablet Take 1 tablet (150 mcg total) by mouth daily before breakfast.   . losartan (COZAAR) 25 MG tablet Take 0.5 tablets (12.5 mg total) by mouth daily.   . metFORMIN (GLUCOPHAGE-XR) 500 MG 24 hr tablet Take 1 tablet (500 mg total) by mouth daily with breakfast.   . metoprolol succinate (TOPROL-XL) 50 MG 24 hr tablet TAKE 1 TABLET BY MOUTH DAILY WITH OR IMMEDIATLEY FOLLOWING A MEAL   . mirabegron ER (MYRBETRIQ) 50 MG TB24 tablet Take 50 mg by mouth daily.   . montelukast (SINGULAIR) 10 MG tablet Take 1 tablet (10 mg total) by mouth at bedtime.   . Multiple Vitamins-Minerals (ZINC PO) Take 50 mg by mouth daily.    [name] olopatadine (PATADAY) 0.1 % ophthalmic solution Place 1 drop into both  eyes 2 (two) times daily. (Patient taking differently: Place 1 drop into both eyes as needed. )   . pantoprazole (PROTONIX) 40 MG tablet Take 1 tablet (40 mg total) by mouth daily as needed.   . polyethylene glycol (MIRALAX MIX-IN PAX) 17 g packet Take 17 g by mouth daily. (Patient taking differently: Take 17 g by mouth as needed. )   . Polyethylene Glycol 400 (BLINK TEARS) 0.25 % SOLN Place 1 drop into both eyes 3 (three) times daily as needed (dry/irritated eyes.).   [name] PROAIR HFA 108 (90 Base) MCG/ACT inhaler TAKE 2 PUFFS EVERY 4 HOURS AS NEEDED FORWHEEZING OR  SHORTNESS OF BREATH (Patient taking differently: Inhale 1-2 puffs into the lungs every 4 (four) hours as needed (wheezing/shortness of breath.). ) 08/05/2019: Using ~once per week  . rosuvastatin (CRESTOR) 40 MG tablet TAKE 1 TABLET BY MOUTH DAILY   . trimethoprim (TRIMPEX) 100 MG tablet Take 1 tablet (100 mg total) by mouth daily.   . nitroGLYCERIN (NITROSTAT) 0.4 MG SL tablet Place 1 tablet (0.4 mg total) under the tongue every 5 (five) minutes as needed for chest pain.   . [DISCONTINUED] ciprofloxacin (CIPRO) 500 MG tablet Take 1 tablet (500 mg total) by mouth 2 (two) times daily.    No facility-administered encounter medications on file as of 08/05/2019.     Objective:   Goals Addressed            This Visit's Progress     Patient Stated   . "I want to keep feeling good" (pt-stated)       CARE PLAN ENTRY (see longtitudinal plan of care for additional care plan information)  Current Barriers:  . Diabetes: uncontrolled; complicated by chronic medical conditions including breast cancer, most recent A1c 9.8% o Extremely frustrated with inability to control blood sugars recently and having to reschedule orthopedic surgery d/t this o Patient extremely concerned that Trulicity isn't working, since she cannot feel the injection or see any injection sites after injection. Notes that she previously did  . Most recent eGFR: ~27 mL/min . Current antihyperglycemic regimen: metformin XR 500 mg daily, Jardiance 25 mg daily, Trulicity 3 mg weekly, glipizide 5 mg QAM . Denies hypoglycemic symptoms, including dizziness, lightheadedness, shaking, sweating . Reports polydipsia . Current meal patterns: o Breakfast: 2 scrambled eggs, 2 slices of bacon, pineapple yogurt; occasional cereal w/ fruit o Lunch: peanut butter crackers, diet green tea o Supper: salad, sweet potato o Drinks: water  o Desserts: cut out everything except sugar free . Current exercise: limited by knee pain . Current blood  glucose readings:  o Fastings: 140-250s  . Cardiovascular risk reduction: o Current hypertensive regimen: metoprolol succinate 50 mg daily, losartan 12.5 mg daily - decreased last week by cardiology d/t office reading w/ SBP <100. Patient reports that she had not been hydrating well to avoid urinary accidents, and now that losartan is decreased, home SBP readings range 130-140s o Current hyperlipidemia regimen: rosuvastatin 40 mg daily  o Current antiplatelet regimen: Eliquis 2.5 mg BID (DVT ppx reduced dose) . Stage I breast cancer ER/PR positive HER-2 negative; follows w/ cancer center. On Arimadex 1 mg daily   Pharmacist Clinical Goal(s):  [name] Over the next 90 days, patient will work with PharmD and primary care provider to address optimized medication management  Interventions: . Comprehensive medication review performed, medication list updated in electronic medical record . Inter-disciplinary care team collaboration (see longitudinal plan of care) . Reviewed Trulicity injection - patient is appropriately  unlocking, removing cap, pinching up skin, injecting, waiting until second click, counting to 10, then removing. Rotating sites. Reviewed that many patients cannot feel the injection.  . Reviewed that basal insulin may be the most appropriate option at this time for patient to obtain rapid control of blood sugars to move forward with orthopedic surgery. Discussed that Trulicity can be maximized, but unsure how much more benefit it may be providing patient. Wonder if beta cell burnout/lack of endogenous insulin production has occurred, hence continued lack of control of A1c w/ multiple non-insulin agents. She follows up w/ PCP on Thursday, and plans to discuss. Patient amenable to "anything that will improve my sugars" . Encouraged to add some post-prandial checks in the next few days to review w/ PCP . Discussed that if she was dehydrated the day she had the cardiology appointment, that could  explain hypotension. She will continue to document home readings, and also bring her home cuff into upcoming PCP appointment to determine accuracy . Discussed costs of her medications. Will send MyChart with income cut offs and other financial eligibility criteria for various manufacturers to determine if she qualifies for any assistance.    Patient Self Care Activities:  . Patient will check blood glucose BID , document, and provide at future appointments . Patient will take medications as prescribed . Patient will report any questions or concerns to provider   Initial goal documentation         Plan:  - Scheduled f/u call in ~ 2 weeks  Catie Darnelle Maffucci, PharmD, [place] [phone]

## 2019-08-05 NOTE — Patient Instructions (Signed)
Visit Information  Goals Addressed            This Visit's Progress     Patient Stated   . "I want to keep feeling good" (pt-stated)       CARE PLAN ENTRY (see longtitudinal plan of care for additional care plan information)  Current Barriers:  . Diabetes: uncontrolled; complicated by chronic medical conditions including breast cancer, most recent A1c 9.8% o Extremely frustrated with inability to control blood sugars recently and having to reschedule orthopedic surgery d/t this o Patient extremely concerned that Trulicity isn't working, since she cannot feel the injection or see any injection sites after injection. Notes that she previously did  . Most recent eGFR: ~27 mL/min . Current antihyperglycemic regimen: metformin XR 500 mg daily, Jardiance 25 mg daily, Trulicity 3 mg weekly, glipizide 5 mg QAM . Denies hypoglycemic symptoms, including dizziness, lightheadedness, shaking, sweating . Reports polydipsia . Current meal patterns: o Breakfast: 2 scrambled eggs, 2 slices of bacon, pineapple yogurt; occasional cereal w/ fruit o Lunch: peanut butter crackers, diet green tea o Supper: salad, sweet potato o Drinks: water  o Desserts: cut out everything except sugar free . Current exercise: limited by knee pain . Current blood glucose readings:  o Fastings: 140-250s  . Cardiovascular risk reduction: o Current hypertensive regimen: metoprolol succinate 50 mg daily, losartan 12.5 mg daily - decreased last week by cardiology d/t office reading w/ SBP <100. Patient reports that she had not been hydrating well to avoid urinary accidents, and now that losartan is decreased, home SBP readings range 130-140s o Current hyperlipidemia regimen: rosuvastatin 40 mg daily  o Current antiplatelet regimen: Eliquis 2.5 mg BID (DVT ppx reduced dose) . Stage I breast cancer ER/PR positive HER-2 negative; follows w/ cancer center. On Arimadex 1 mg daily   Pharmacist Clinical Goal(s):  [name] Over the next  90 days, patient will work with PharmD and primary care provider to address optimized medication management  Interventions: . Comprehensive medication review performed, medication list updated in electronic medical record . Inter-disciplinary care team collaboration (see longitudinal plan of care) . Reviewed Trulicity injection - patient is appropriately unlocking, removing cap, pinching up skin, injecting, waiting until second click, counting to 10, then removing. Rotating sites. Reviewed that many patients cannot feel the injection.  . Reviewed that basal insulin may be the most appropriate option at this time for patient to obtain rapid control of blood sugars to move forward with orthopedic surgery. Discussed that Trulicity can be maximized, but unsure how much more benefit it may be providing patient. Wonder if beta cell burnout/lack of endogenous insulin production has occurred, hence continued lack of control of A1c w/ multiple non-insulin agents. She follows up w/ PCP on Thursday, and plans to discuss. Patient amenable to "anything that will improve my sugars" . Encouraged to add some post-prandial checks in the next few days to review w/ PCP . Discussed that if she was dehydrated the day she had the cardiology appointment, that could explain hypotension. She will continue to document home readings, and also bring her home cuff into upcoming PCP appointment to determine accuracy . Discussed costs of her medications. Will send MyChart with income cut offs and other financial eligibility criteria for various manufacturers to determine if she qualifies for any assistance.    Patient Self Care Activities:  . Patient will check blood glucose BID , document, and provide at future appointments . Patient will take medications as prescribed . Patient will report  any questions or concerns to provider   Initial goal documentation        Patient verbalizes understanding of instructions provided  today.   Plan:  - Scheduled f/u call in ~ 2 weeks  Catie Darnelle Maffucci, PharmD, [place] [phone]

## 2019-08-07 ENCOUNTER — Other Ambulatory Visit: Payer: Self-pay

## 2019-08-07 ENCOUNTER — Ambulatory Visit (INDEPENDENT_AMBULATORY_CARE_PROVIDER_SITE_OTHER): Payer: Medicare Other | Admitting: Family Medicine

## 2019-08-07 ENCOUNTER — Encounter: Payer: Self-pay | Admitting: Family Medicine

## 2019-08-07 VITALS — BP 114/74 | HR 78 | Temp 97.9°F

## 2019-08-07 DIAGNOSIS — I200 Unstable angina: Secondary | ICD-10-CM | POA: Diagnosis not present

## 2019-08-07 DIAGNOSIS — E1165 Type 2 diabetes mellitus with hyperglycemia: Secondary | ICD-10-CM

## 2019-08-07 DIAGNOSIS — H6122 Impacted cerumen, left ear: Secondary | ICD-10-CM | POA: Diagnosis not present

## 2019-08-07 DIAGNOSIS — E118 Type 2 diabetes mellitus with unspecified complications: Secondary | ICD-10-CM | POA: Diagnosis not present

## 2019-08-07 DIAGNOSIS — IMO0002 Reserved for concepts with insufficient information to code with codable children: Secondary | ICD-10-CM

## 2019-08-07 NOTE — Progress Notes (Signed)
BP 114/74 (BP Location: Left Arm, Patient Position: Sitting, Cuff Size: Large)   Pulse 78   Temp 97.9 F (36.6 C) (Oral)   SpO2 95%    Subjective:    Patient ID: Tammy [name], female    DOB: 07/16/1945, 74 y.o.   MRN: [id number]  HPI: Tammy [name] is a 74 y.o. female  Chief Complaint  Patient presents with  . Diabetes   Started back on her metformin and has been feeling much better. Her urine is better. She notes that her sugars have been running better. No hypoglycemia.   EAG CLOGGED Duration: few days Involved ear(s):  left Sensation of feeling clogged/plugged: yes Decreased/muffled hearing:yes Ear pain: yes Fever: no Otorrhea: no Hearing loss: yes Upper respiratory infection symptoms: no Using Q-Tips: no Status: worse History of cerumenosis: no Treatments attempted: none  She is otherwise doing well with no other concerns or complaints at this time.   Relevant past medical, surgical, family and social history reviewed and updated as indicated. Interim medical history since our last visit reviewed. Allergies and medications reviewed and updated.  Review of Systems  Constitutional: Negative.   HENT: Positive for congestion and ear pain. Negative for dental problem, drooling, ear discharge, facial swelling, hearing loss, mouth sores, nosebleeds, postnasal drip, rhinorrhea, sinus pressure, sinus pain, sneezing, sore throat, tinnitus, trouble swallowing and voice change.   Respiratory: Negative.   Cardiovascular: Negative.   Gastrointestinal: Negative.   Musculoskeletal: Positive for arthralgias, back pain, gait problem and myalgias. Negative for joint swelling, neck pain and neck stiffness.  Neurological: Positive for dizziness. Negative for tremors, seizures, syncope, facial asymmetry, speech difficulty, weakness, light-headedness, numbness and headaches.  Psychiatric/Behavioral: Negative.     Per HPI unless specifically indicated above     Objective:    BP  114/74 (BP Location: Left Arm, Patient Position: Sitting, Cuff Size: Large)   Pulse 78   Temp 97.9 F (36.6 C) (Oral)   SpO2 95%   Wt Readings from Last 3 Encounters:  08/01/19 210 lb (95.3 kg)  08/01/19 210 lb (95.3 kg)  07/25/19 210 lb (95.3 kg)    Physical Exam Vitals and nursing note reviewed.  Constitutional:      General: She is not in acute distress.    Appearance: Normal appearance. She is obese. She is not ill-appearing, toxic-appearing or diaphoretic.  HENT:     Head: Normocephalic and atraumatic.     Right Ear: Tympanic membrane, ear canal and external ear normal.     Left Ear: External ear normal. There is impacted cerumen.     Nose: Nose normal.     Mouth/Throat:     Mouth: Mucous membranes are moist.     Pharynx: Oropharynx is clear.  Eyes:     General: No scleral icterus.       Right eye: No discharge.        Left eye: No discharge.     Extraocular Movements: Extraocular movements intact.     Conjunctiva/sclera: Conjunctivae normal.     Pupils: Pupils are equal, round, and reactive to light.  Cardiovascular:     Rate and Rhythm: Normal rate and regular rhythm.     Pulses: Normal pulses.     Heart sounds: Normal heart sounds. No murmur. No friction rub. No gallop.   Pulmonary:     Effort: Pulmonary effort is normal. No respiratory distress.     Breath sounds: Normal breath sounds. No stridor. No wheezing, rhonchi or rales.  Chest:  Chest wall: No tenderness.  Musculoskeletal:        General: Normal range of motion.     Cervical back: Normal range of motion and neck supple.  Skin:    General: Skin is warm and dry.     Capillary Refill: Capillary refill takes less than 2 seconds.     Coloration: Skin is not jaundiced or pale.     Findings: No bruising, erythema, lesion or rash.  Neurological:     General: No focal deficit present.     Mental Status: She is alert and oriented to person, place, and time. Mental status is at baseline.  Psychiatric:         Mood and Affect: Mood normal.        Behavior: Behavior normal.        Thought Content: Thought content normal.        Judgment: Judgment normal.     Results for orders placed or performed in visit on 33/82/50  Basic metabolic panel  Result Value Ref Range   Glucose 161 (H) 65 - 99 mg/dL   BUN 30 (H) 8 - 27 mg/dL   Creatinine, Ser 1.77 (H) 0.57 - 1.00 mg/dL   GFR calc non Af Amer 28 (L) >59 mL/min/1.73   GFR calc Af Amer 32 (L) >59 mL/min/1.73   BUN/Creatinine Ratio 17 12 - 28   Sodium 138 134 - 144 mmol/L   Potassium 5.1 3.5 - 5.2 mmol/L   Chloride 100 96 - 106 mmol/L   CO2 22 20 - 29 mmol/L   Calcium 10.2 8.7 - 10.3 mg/dL      Assessment & Plan:   Problem List Items Addressed This Visit      Endocrine   Uncontrolled type 2 diabetes mellitus with complication, without long-term current use of insulin (HCC) - Primary    Feeling better with her metformin. Will recheck kidney function today. If kidney function worsening, will start insulin. If no change consider increasing metformin. Await results and treat as needed. Call with any concerns.        Other Visit Diagnoses    Hearing loss due to cerumen impaction, left       Ear flushed today with good results. Call with any concerns.        Follow up plan: Return in about 4 weeks (around 09/04/2019).

## 2019-08-08 ENCOUNTER — Other Ambulatory Visit: Payer: Medicare Other

## 2019-08-08 ENCOUNTER — Encounter: Payer: Self-pay | Admitting: Family Medicine

## 2019-08-08 ENCOUNTER — Other Ambulatory Visit: Payer: Self-pay | Admitting: Family Medicine

## 2019-08-08 LAB — BASIC METABOLIC PANEL
BUN/Creatinine Ratio: 17 (ref 12–28)
BUN: 30 mg/dL — ABNORMAL HIGH (ref 8–27)
CO2: 22 mmol/L (ref 20–29)
Calcium: 10.2 mg/dL (ref 8.7–10.3)
Chloride: 100 mmol/L (ref 96–106)
Creatinine, Ser: 1.77 mg/dL — ABNORMAL HIGH (ref 0.57–1.00)
GFR calc Af Amer: 32 mL/min/{1.73_m2} — ABNORMAL LOW (ref >59)
GFR calc non Af Amer: 28 mL/min/{1.73_m2} — ABNORMAL LOW (ref >59)
Glucose: 161 mg/dL — ABNORMAL HIGH (ref 65–99)
Potassium: 5.1 mmol/L (ref 3.5–5.2)
Sodium: 138 mmol/L (ref 134–144)

## 2019-08-08 MED ORDER — TRESIBA FLEXTOUCH 100 UNIT/ML ~~LOC~~ SOPN: 20.0000 [IU] | PEN_INJECTOR | Freq: Every day | SUBCUTANEOUS | 2 refills | Status: DC

## 2019-08-08 MED ORDER — "PEN NEEDLES 3/16"" 31G X 5 MM MISC": 1.0000 | Freq: Every day | 12 refills | Status: DC

## 2019-08-08 NOTE — Assessment & Plan Note (Signed)
Feeling better with her metformin. Will recheck kidney function today. If kidney function worsening, will start insulin. If no change consider increasing metformin. Await results and treat as needed. Call with any concerns.

## 2019-08-11 ENCOUNTER — Other Ambulatory Visit: Payer: Self-pay | Admitting: Family Medicine

## 2019-08-12 ENCOUNTER — Telehealth: Payer: Self-pay | Admitting: Family Medicine

## 2019-08-12 NOTE — Telephone Encounter (Signed)
Tammy [name] pt's husband presented in office stating that the pt said that Dr Wynetta Emery said that they could have a sample on the new insulin that was started at last visit. It looks to be tresiba start date 08/08/19. Please advise.

## 2019-08-12 NOTE — Telephone Encounter (Signed)
Sample signed out. Called and let patient's husband know that it is ready to be picked up.

## 2019-08-12 NOTE — Telephone Encounter (Signed)
OK to give sample

## 2019-08-12 NOTE — Telephone Encounter (Signed)
Per Dr. [name] "Two day hold of eliquis should be fine. We could add asa 81 for the days not on eliquis."  Per visit 08/01/19 with Elenor Quinones, PA pending echocardiogram prior to clearance for R TKA scheduled. As her TKA is scheduled for 09/04/19 and her echo is scheduled to 09/03/19, will route to triage staff to see if we can expedite her echocardiogram to an earlier date.   Loel Dubonnet, NP 08/12/2019, 6:57 PM

## 2019-08-13 NOTE — Telephone Encounter (Signed)
Loel Dubonnet, NP  Visser, Jacquelyn D, PA-C; [facility] Triage 12 hours ago (7:06 PM)   Can we please reschedule Miss Lieser's echo? It is currently scheduled for the day before her TKA and we optimally will have it done at least 1 week prior so we have results to grant clearance. Thanks!

## 2019-08-13 NOTE — Telephone Encounter (Signed)
Attempted to schedule sooner. Patient declined same day 6/2 appt but states surgery has been postponed due to issues with A1c.    Leaving as scheduled for now and patient instructed to call back if deciding she wants to test sooner and or if procedure is moved back up.

## 2019-08-14 ENCOUNTER — Other Ambulatory Visit: Payer: Self-pay

## 2019-08-14 ENCOUNTER — Other Ambulatory Visit: Payer: Medicare Other

## 2019-08-14 NOTE — Telephone Encounter (Signed)
Patient and patient's husband presented for labs and sample [place]. Per Dr. Wynetta Emery patient did not need labs. Appointment canceled. Patient asked where to give the injection and I explained SQ sites. Patient asked the how much to give and advised that the instructions from Dr. Wynetta Emery were for 0.2 mL's daily. Patient asked if she should stop the Trulicity. Per Dr. Durenda Age note, patient needed to stop taking Metformin. Continue Trulicity. Patient verbalized understanding.   Routing to provider as Juluis Rainier and to review.

## 2019-08-15 ENCOUNTER — Encounter: Payer: Self-pay | Admitting: *Deleted

## 2019-08-15 NOTE — Progress Notes (Signed)
Abnormal finding noted. PCP notified and requested referral to the lung nodule clinic for further follow up if appropriate.

## 2019-08-18 ENCOUNTER — Other Ambulatory Visit: Payer: Self-pay

## 2019-08-18 ENCOUNTER — Encounter: Payer: Self-pay | Admitting: Physician Assistant

## 2019-08-18 ENCOUNTER — Ambulatory Visit (INDEPENDENT_AMBULATORY_CARE_PROVIDER_SITE_OTHER): Payer: Medicare Other | Admitting: Physician Assistant

## 2019-08-18 VITALS — BP 135/84 | HR 77 | Ht 70.0 in | Wt 210.0 lb

## 2019-08-18 DIAGNOSIS — Z87440 Personal history of urinary (tract) infections: Secondary | ICD-10-CM

## 2019-08-18 DIAGNOSIS — R300 Dysuria: Secondary | ICD-10-CM | POA: Diagnosis not present

## 2019-08-18 DIAGNOSIS — N3001 Acute cystitis with hematuria: Secondary | ICD-10-CM

## 2019-08-18 DIAGNOSIS — N3941 Urge incontinence: Secondary | ICD-10-CM | POA: Diagnosis not present

## 2019-08-18 DIAGNOSIS — N3281 Overactive bladder: Secondary | ICD-10-CM | POA: Diagnosis not present

## 2019-08-18 LAB — URINALYSIS, COMPLETE
Bilirubin, UA: NEGATIVE
Ketones, UA: NEGATIVE
Nitrite, UA: NEGATIVE
Specific Gravity, UA: 1.015 (ref 1.005–1.030)
Urobilinogen, Ur: 0.2 mg/dL (ref 0.2–1.0)
pH, UA: 5.5 (ref 5.0–7.5)

## 2019-08-18 LAB — BLADDER SCAN AMB NON-IMAGING: Scan Result: 1

## 2019-08-18 LAB — MICROSCOPIC EXAMINATION
RBC, Urine: >30 /hpf — AB (ref 0–2)
WBC, UA: >30 /hpf — AB (ref 0–5)

## 2019-08-18 MED ORDER — CEPHALEXIN 500 MG PO CAPS: 500.0000 mg | ORAL_CAPSULE | Freq: Two times a day (BID) | ORAL | 0 refills | Status: AC

## 2019-08-18 MED ORDER — MIRABEGRON ER 50 MG PO TB24: 50.0000 mg | ORAL_TABLET | Freq: Every day | ORAL | 2 refills | Status: DC

## 2019-08-18 NOTE — Progress Notes (Signed)
08/18/2019 3:02 PM   Tammy [name] 1945/05/01 [id number]  CC: Chief Complaint  Patient presents with  . Urinary Incontinence    HPI: Tammy [name] is a 74 y.o. female PMH uncontrolled type II diabetes, recurrent UTI on daily trimethoprim, asymptomatic right UPJ obstruction, and OAB who presents today for evaluation of possible UTI. She is an established [facility] patient who last saw Dr. Bernardo Heater on 08/01/2019 for evaluation of urge incontinence.  She was started on Myrbetriq 50 mg daily samples at that time.  Today, patient reports an approximate 27-monthhistory of worsened urgency, frequency, urinary incontinence, and fatigue as well as a 3-day history of terminal dysuria and the sensation of incomplete bladder emptying.  She also reports constant, pinching bilateral low back pain.  She has a history of back surgery but does not believe this discomfort is associated with her back problems.  She states the bilateral back pain has been there "as long as I can remember."  No known stone history.  She reports she initially felt the Myrbetriq was helping with her OAB symptoms, however today she does not believe she has had any symptomatic improvement.  Per chart review, patient has had persistent pyuria and microscopic hematuria since late April 2021.  She has had 2 associated positive urine cultures with Klebsiella pneumoniae during this time.  She has been treated with Macrobid 100 mg twice daily x7 days and ciprofloxacin 500 mg twice daily x7 days during this time.  Patient continues to take daily trimethoprim suppressive therapy.  She is not on daily probiotics or cranberry supplements.  She has a history of breast cancer approximately 1 year ago.  In-office UA today positive for 3+ glucose, 3+ blood, 3+ protein, and trace leukocyte esterase; urine microscopy with >30 WBCs/HPF, >30 RBCs/HPF, and yeast. PVR 170m  PMH: Past Medical History:  Diagnosis Date  . Asthma   . Benign neoplasm of colon     . Breast cancer ([facility]   left breast  . Cervical disc disease    "bulging" - no limitations per pt  . Chronic diastolic CHF (congestive heart failure) ([facility]   a. 07/2012 Echo: Nl EF. mild inferoseptal HK, Gr 2 DD, mild conc LVH, mild PR/TR, mild PAH.  . CKD (chronic kidney disease), stage III   . COPD (chronic obstructive pulmonary disease) ([facility]  . Coronary artery disease    a. 11/2011 Cath: LAD 5063m LCX min irregs, RCA 70p, 100m21mh L->R collats, EF 60%-->Med Rx.  . Diabetes mellitus without complication (HCC)[place]. Fusion of lumbar spine 12/22/2015  . GERD (gastroesophageal reflux disease)   . History of DVT (deep vein thrombosis)   . History of tobacco abuse    a. Quit 2011.  . HyMarland Kitchenerlipidemia   . Hypertension   . Hypothyroidism   . Obesity   . Palpitations   . PE (pulmonary embolism) 10/15  . Personal history of radiation therapy   . PONV (postoperative nausea and vomiting)   . Pulmonary embolus (HCC)[place]/15/2015   RLL. Presented with SOB and elevated d-dimer.   . PVMarland Kitchen (peripheral vascular disease) (HCC)[place] a. PTA of right leg and left femoral artery with stenosis.  . Renal insufficiency   . [facility]   TIAs - no deficits    Surgical History: Past Surgical History:  Procedure Laterality Date  . ANGIOPLASTY / STENTING FEMORAL     PVD; angioplasty right leg and left femoral artery with stenosis.   .[name]  APPENDECTOMY  Oct. 2016  . BACK SURGERY  03/2012   nerve stimulator inserted  . BACK SURGERY     screws, rods replaced with new hardware.  [name] BACK SURGERY  11/2016  . BREAST LUMPECTOMY Left 08/26/2018  . BREAST SURGERY    . CARDIAC CATHETERIZATION  12/07/2011   Mid LAD 50%, distal LAD 50%, mid RCA 70%, distal RCA 100%   . CARDIAC CATHETERIZATION  01/04/2011   100% occluded mid RCA with good collaterals from distal LAD, normal LVEF.  [name] CHOLECYSTECTOMY    . COLONOSCOPY  2013  . COLONOSCOPY WITH PROPOFOL N/A 05/24/2015   Procedure: COLONOSCOPY WITH  PROPOFOL;  Surgeon: [name], MD;  Location: [place];  Service: Endoscopy;  Laterality: N/A;  Diabetic - oral meds  . COLONOSCOPY WITH PROPOFOL N/A 01/28/2019   Procedure: COLONOSCOPY WITH PROPOFOL;  Surgeon: [name], MD;  Location: [facility];  Service: Endoscopy;  Laterality: N/A;  . DIAGNOSTIC LAPAROSCOPY    . ESOPHAGOGASTRODUODENOSCOPY (EGD) WITH PROPOFOL N/A 01/28/2019   Procedure: ESOPHAGOGASTRODUODENOSCOPY (EGD) WITH PROPOFOL;  Surgeon: [name], MD;  Location: [facility];  Service: Endoscopy;  Laterality: N/A;  . LAPAROSCOPIC APPENDECTOMY N/A 01/06/2015   Procedure: APPENDECTOMY LAPAROSCOPIC;  Surgeon: [name], MD;  Location: [facility];  Service: General;  Laterality: N/A;  . LEFT HEART CATH AND CORONARY ANGIOGRAPHY Left 04/04/2019   Procedure: LEFT HEART CATH AND CORONARY ANGIOGRAPHY;  Surgeon: [name], MD;  Location: [facility];  Service: Cardiovascular;  Laterality: Left;  [name] MASTECTOMY    . MASTECTOMY W/ SENTINEL NODE BIOPSY Left 08/26/2018   Procedure: PARTIAL MASTECTOMY WIDE EXCISION WITH SENTINEL LYMPH NODE BIOPSY LEFT;  Surgeon: [name], MD;  Location: [facility];  Service: General;  Laterality: Left;  . POLYPECTOMY  05/24/2015   Procedure: POLYPECTOMY;  Surgeon: [name], MD;  Location: [place];  Service: Endoscopy;;  . THYROIDECTOMY    . TRIGGER FINGER RELEASE      Home Medications:  Allergies as of 08/18/2019      Reactions   Hydrocodone Other (See Comments)   Unresponsive   Aleve [naproxen Sodium]    Hives & itching    Contrast Media [iodinated Diagnostic Agents]    Pt avoids due to kidney issues   Ioxaglate Other (See Comments)   (iodine) Pt avoids due to kidney issues   Oxycodone Other (See Comments)   hallucinations   Ranexa [ranolazine]    Sick on stomach   Sulfa Antibiotics Other (See Comments)   Pt avoids due to kidney issues   Tramadol Other (See Comments)    nightmares      Medication List       Accurate as of August 18, 2019  3:02 PM. If you have any questions, ask your nurse or doctor.        anastrozole 1 MG tablet Commonly known as: ARIMIDEX TAKE ONE TABLET EVERY DAY   apixaban 2.5 MG Tabs tablet Commonly known as: Eliquis Take 1 tablet (2.5 mg) by mouth twice daily   Blink Tears 0.25 % Soln Generic drug: Polyethylene Glycol 400 Place 1 drop into both eyes 3 (three) times daily as needed (dry/irritated eyes.).   blood glucose meter kit and supplies Kit Dispense based on patient and insurance preference. Use one time daily as directed, E11.9   Calcium 600 + D 600-200 MG-UNIT Tabs Generic drug: Calcium Carb-Cholecalciferol Take 1 tablet by mouth daily.   cetirizine 10 MG tablet Commonly known  as: ZYRTEC Take 1 tablet (10 mg total) by mouth daily.   cholecalciferol 25 MCG (1000 UNIT) tablet Commonly known as: VITAMIN D3 Take 1,000 Units by mouth daily.   DULoxetine 60 MG capsule Commonly known as: CYMBALTA TAKE 1 CAPSULE BY MOUTH ONCE DAILY   gabapentin 300 MG capsule Commonly known as: NEURONTIN TAKE 1 CAPSULE BY MOUTH THREE TIMES DAILY What changed:   how much to take  when to take this  additional instructions   glipiZIDE 5 MG tablet Commonly known as: GLUCOTROL TAKE ONE TABLET BY MOUTH EVERY DAY BEFORE BREAKFAST   isosorbide mononitrate 60 MG 24 hr tablet Commonly known as: IMDUR TAKE 1 TABLET BY MOUTH AT BEDTIME AND 1/2 TABLET IN THE MORNING What changed: See the new instructions.   Jardiance 25 MG Tabs tablet Generic drug: empagliflozin TAKE ONE TABLET 25MG EVERY DAY BEFORE BREAKFAST   levothyroxine 150 MCG tablet Commonly known as: SYNTHROID Take 1 tablet (150 mcg total) by mouth daily before breakfast.   losartan 25 MG tablet Commonly known as: COZAAR Take 0.5 tablets (12.5 mg total) by mouth daily.   metoprolol succinate 50 MG 24 hr tablet Commonly known as: TOPROL-XL TAKE 1 TABLET BY  MOUTH DAILY WITH OR IMMEDIATLEY FOLLOWING A MEAL   montelukast 10 MG tablet Commonly known as: SINGULAIR Take 1 tablet (10 mg total) by mouth at bedtime.   Myrbetriq 50 MG Tb24 tablet Generic drug: mirabegron ER Take 50 mg by mouth daily.   nitroGLYCERIN 0.4 MG SL tablet Commonly known as: NITROSTAT Place 1 tablet (0.4 mg total) under the tongue every 5 (five) minutes as needed for chest pain.   olopatadine 0.1 % ophthalmic solution Commonly known as: Pataday Place 1 drop into both eyes 2 (two) times daily.   pantoprazole 40 MG tablet Commonly known as: PROTONIX Take 1 tablet (40 mg total) by mouth daily as needed.   Pen Needles 3/16" 31G X 5 MM Misc 1 each by Does not apply route daily.   polyethylene glycol 17 g packet Commonly known as: MiraLax Mix-In Pax Take 17 g by mouth daily.   ProAir HFA 108 (90 Base) MCG/ACT inhaler Generic drug: albuterol TAKE 2 PUFFS EVERY 4 HOURS AS NEEDED FORWHEEZING OR SHORTNESS OF BREATH   rosuvastatin 40 MG tablet Commonly known as: CRESTOR TAKE 1 TABLET BY MOUTH DAILY   Tresiba FlexTouch 100 UNIT/ML FlexTouch Pen Generic drug: insulin degludec Inject 0.2 mLs (20 Units total) into the skin daily.   trimethoprim 100 MG tablet Commonly known as: TRIMPEX Take 1 tablet (100 mg total) by mouth daily.   Trulicity 3 YT/0.1SW Sopn Generic drug: Dulaglutide Inject 3 mg into the skin once a week.   ZINC PO Take 50 mg by mouth daily.       Allergies:  Allergies  Allergen Reactions  . Hydrocodone Other (See Comments)    Unresponsive  . Aleve [Naproxen Sodium]     Hives & itching   . Contrast Media [Iodinated Diagnostic Agents]     Pt avoids due to kidney issues  . Ioxaglate Other (See Comments)    (iodine) Pt avoids due to kidney issues   . Oxycodone Other (See Comments)    hallucinations  . Ranexa [Ranolazine]     Sick on stomach  . Sulfa Antibiotics Other (See Comments)    Pt avoids due to kidney issues  . Tramadol  Other (See Comments)    nightmares    Family History: Family History  Problem Relation Age of  Onset  . Hypertension Father   . Heart disease Father   . Heart attack Father   . Diabetes Father   . Cancer Mother        colon  . Heart attack Brother   . Diabetes Brother   . Hypertension Brother   . Heart disease Brother   . Heart attack Brother   . Diabetes Brother   . Hypertension Brother   . Heart disease Brother   . Heart attack Brother   . Diabetes Brother   . Hypertension Brother   . Heart disease Brother   . Cancer Sister        lung  . COPD Sister   . COPD Sister   . Breast cancer Neg Hx     Social History:   reports that she quit smoking about 12 years ago. Her smoking use included cigarettes. She has a 7.50 pack-year smoking history. She has never used smokeless tobacco. She reports that she does not drink alcohol or use drugs.  Physical Exam: BP 135/84   Pulse 77   Ht 5' 10"  (1.778 m)   Wt 210 lb (95.3 kg)   BMI 30.13 kg/m   Constitutional:  Alert and oriented, no acute distress, nontoxic appearing HEENT: [place], AT Cardiovascular: No clubbing, cyanosis, or edema Respiratory: Normal respiratory effort, no increased work of breathing GU: No CVA tenderness Skin: No rashes, bruises or suspicious lesions Neurologic: Grossly intact, no focal deficits, moving all 4 extremities Psychiatric: Normal mood and affect  Laboratory Data: Results for orders placed or performed in visit on 08/18/19  Microscopic Examination   URINE  Result Value Ref Range   WBC, UA >30 (A) 0 - 5 /hpf   RBC >30 (A) 0 - 2 /hpf   Epithelial Cells (non renal) 0-10 0 - 10 /hpf   Bacteria, UA Few None seen/Few   Yeast, UA Present (A) None seen  Urinalysis, Complete  Result Value Ref Range   Specific Gravity, UA 1.015 1.005 - 1.030   pH, UA 5.5 5.0 - 7.5   Color, UA Yellow Yellow   Appearance Ur Cloudy (A) Clear   Leukocytes,UA Trace (A) Negative   Protein,UA 3+ (A) Negative/Trace    Glucose, UA 3+ (A) Negative   Ketones, UA Negative Negative   RBC, UA 3+ (A) Negative   Bilirubin, UA Negative Negative   Urobilinogen, Ur 0.2 0.2 - 1.0 mg/dL   Nitrite, UA Negative Negative   Microscopic Examination See below:   BLADDER SCAN AMB NON-IMAGING  Result Value Ref Range   Scan Result 1    Assessment & Plan:  74 year old female with a history of uncontrolled diabetes, OAB, recurrent Klebsiella UTI, and asymptomatic right UPJ obstruction presents with reports of a 3-day history of terminal dysuria and the sensation of incomplete bladder emptying superimposed on a 79-monthhistory of worsened OAB symptoms. 1. Acute cystitis with hematuria UA today notable for pyuria and microscopic hematuria.  Will send for culture for further evaluation and start her on empiric Keflex today.  PVR WNL.  Given frequency of recent Klebsiella positive UTIs with persistent microscopic hematuria and reports of back pain, will obtain CT stone study for evaluation of possible nidus.  That said, back pain more likely to be MSK in origin.  Will require repeat UA in 1 week to prove resolution of MH.  If MH persists, recommend CT hematuria rather than CT stone study. - Urinalysis, Complete - CULTURE, URINE COMPREHENSIVE - CT RENAL STONE STUDY -  cephALEXin (KEFLEX) 500 MG capsule; Take 1 capsule (500 mg total) by mouth 2 (two) times daily for 7 days.  Dispense: 14 capsule; Refill: 0 - BLADDER SCAN AMB NON-IMAGING  2. History of recurrent UTI (urinary tract infection) Unclear today of her history represents true recurrent UTI versus Klebsiella urinary colonization with superimposed OAB.  Regardless, counseled patient to start daily probiotic supplements and twice daily cranberry supplements for UTI prevention.  Will defer topical vaginal estrogen cream given her breast cancer history.  Okay to continue daily trimethoprim.  3. Overactive bladder Counseled patient to continue Myrbetriq.  Prescribing a 81-month sample, which will last her to her next scheduled follow-up with Dr. SBernardo Heater - mirabegron ER (MYRBETRIQ) 50 MG TB24 tablet; Take 1 tablet (50 mg total) by mouth daily.  Dispense: 30 tablet; Refill: 2    Return in about 1 week (around 08/25/2019) for Lab visit for UA.  SDebroah Loop PA-C  [facility] [address] [place] [phone]

## 2019-08-18 NOTE — Patient Instructions (Signed)
Recurrent UTI Prevention Strategies  1. Start taking an over-the-counter cranberry supplement for urinary tract health. Take this once or twice daily on an empty stomach, e.g. right before bed. 2. Start taking an over-the-counter probiotic, preferably containing lactobacillus. Take this daily.

## 2019-08-20 ENCOUNTER — Ambulatory Visit (INDEPENDENT_AMBULATORY_CARE_PROVIDER_SITE_OTHER): Payer: Medicare Other | Admitting: Pharmacist

## 2019-08-20 DIAGNOSIS — E118 Type 2 diabetes mellitus with unspecified complications: Secondary | ICD-10-CM

## 2019-08-20 DIAGNOSIS — IMO0002 Reserved for concepts with insufficient information to code with codable children: Secondary | ICD-10-CM

## 2019-08-20 DIAGNOSIS — E1165 Type 2 diabetes mellitus with hyperglycemia: Secondary | ICD-10-CM | POA: Diagnosis not present

## 2019-08-20 NOTE — Chronic Care Management (AMB) (Addendum)
Chronic Care Management   Follow Up Note   08/20/2019 Name: TEA COLLUMS MRN: [id number] DOB: February 08, 1946  Referred by: [name], DO Reason for referral : Chronic Care Management (Medication Management)   ALLIAH BOULANGER is a 74 y.o. year old female who is a primary care patient of [name], DO. The CCM team was consulted for assistance with chronic disease management and care coordination needs.    Contacted patient for medication management review.   Review of patient status, including review of consultants reports, relevant laboratory and other test results, and collaboration with appropriate care team members and the patient's provider was performed as part of comprehensive patient evaluation and provision of chronic care management services.    SDOH (Social Determinants of Health) assessments performed: No See Care Plan activities for detailed interventions related to [facility])     Outpatient Encounter Medications as of 08/20/2019  Medication Sig Note  . cephALEXin (KEFLEX) 500 MG capsule Take 1 capsule (500 mg total) by mouth 2 (two) times daily for 7 days.   . Dulaglutide (TRULICITY) 3 WJ/1.9JY SOPN Inject 3 mg into the skin once a week.   [name] glipiZIDE (GLUCOTROL) 5 MG tablet TAKE ONE TABLET BY MOUTH EVERY DAY BEFORE BREAKFAST   . insulin degludec (TRESIBA FLEXTOUCH) 100 UNIT/ML FlexTouch Pen Inject 0.2 mLs (20 Units total) into the skin daily.   . Insulin Pen Needle (PEN NEEDLES 3/16") 31G X 5 MM MISC 1 each by Does not apply route daily.   . isosorbide mononitrate (IMDUR) 60 MG 24 hr tablet TAKE 1 TABLET BY MOUTH AT BEDTIME AND 1/2 TABLET IN THE MORNING (Patient taking differently: Take 30-60 mg by mouth See admin instructions. Take 0.5 tablet (30 mg) by mouth in the morning & take 1 capsule (60 mg) by mouth at night.)   . levothyroxine (SYNTHROID) 150 MCG tablet Take 1 tablet (150 mcg total) by mouth daily before breakfast.   . losartan (COZAAR) 25 MG tablet Take 0.5 tablets  (12.5 mg total) by mouth daily. 08/20/2019: Taking 25 mg daily   . metoprolol succinate (TOPROL-XL) 50 MG 24 hr tablet TAKE 1 TABLET BY MOUTH DAILY WITH OR IMMEDIATLEY FOLLOWING A MEAL   . mirabegron ER (MYRBETRIQ) 50 MG TB24 tablet Take 1 tablet (50 mg total) by mouth daily.   . montelukast (SINGULAIR) 10 MG tablet Take 1 tablet (10 mg total) by mouth at bedtime.   . pantoprazole (PROTONIX) 40 MG tablet Take 1 tablet (40 mg total) by mouth daily as needed.   . [DISCONTINUED] JARDIANCE 25 MG TABS tablet TAKE ONE TABLET 25MG EVERY DAY BEFORE BREAKFAST 08/20/2019: Verbal per Dr. Wynetta Emery  . anastrozole (ARIMIDEX) 1 MG tablet TAKE ONE TABLET EVERY DAY   . apixaban (ELIQUIS) 2.5 MG TABS tablet Take 1 tablet (2.5 mg) by mouth twice daily   . blood glucose meter kit and supplies KIT Dispense based on patient and insurance preference. Use one time daily as directed, E11.9   . Calcium Carb-Cholecalciferol (CALCIUM 600 + D) 600-200 MG-UNIT TABS Take 1 tablet by mouth daily.    . cetirizine (ZYRTEC) 10 MG tablet Take 1 tablet (10 mg total) by mouth daily.   . cholecalciferol (VITAMIN D3) 25 MCG (1000 UT) tablet Take 1,000 Units by mouth daily.   . DULoxetine (CYMBALTA) 60 MG capsule TAKE 1 CAPSULE BY MOUTH ONCE DAILY   . gabapentin (NEURONTIN) 300 MG capsule TAKE 1 CAPSULE BY MOUTH THREE TIMES DAILY (Patient taking differently: Take 300-600  mg by mouth See admin instructions. Take 1 capsule (300 mg) by mouth in the morning & take 2 capsules (600 mg) by mouth at night.)   . Multiple Vitamins-Minerals (ZINC PO) Take 50 mg by mouth daily.    . nitroGLYCERIN (NITROSTAT) 0.4 MG SL tablet Place 1 tablet (0.4 mg total) under the tongue every 5 (five) minutes as needed for chest pain.   [name] olopatadine (PATADAY) 0.1 % ophthalmic solution Place 1 drop into both eyes 2 (two) times daily.   . polyethylene glycol (MIRALAX MIX-IN PAX) 17 g packet Take 17 g by mouth daily.   . Polyethylene Glycol 400 (BLINK TEARS) 0.25 % SOLN  Place 1 drop into both eyes 3 (three) times daily as needed (dry/irritated eyes.).   [name] PROAIR HFA 108 (90 Base) MCG/ACT inhaler TAKE 2 PUFFS EVERY 4 HOURS AS NEEDED FORWHEEZING OR SHORTNESS OF BREATH 08/05/2019: Using ~once per week  . rosuvastatin (CRESTOR) 40 MG tablet TAKE 1 TABLET BY MOUTH DAILY   . trimethoprim (TRIMPEX) 100 MG tablet Take 1 tablet (100 mg total) by mouth daily.    No facility-administered encounter medications on file as of 08/20/2019.     Objective:   Goals Addressed            This Visit's Progress     Patient Stated   . "I want to keep feeling good" (pt-stated)       CARE PLAN ENTRY (see longtitudinal plan of care for additional care plan information)  Current Barriers:  . Diabetes: uncontrolled; complicated by chronic medical conditions including breast cancer, most recent A1c 9.8% o Frustrated by episodes of urinary incontinence and lack of improvement in blood sugars  . Most recent eGFR: ~27 mL/min . Current antihyperglycemic regimen: Jardiance 25 mg daily, Trulicity 3 mg weekly, glipizide 5 mg QAM, Tresiba 20 units daily - though has only been taking 2 units daily  . Denies hypoglycemic symptoms, including dizziness, lightheadedness, shaking, sweating . Current glucose readings:  o Fasting yesterday 155 (though reports she didn't eat much that day), this morning was 255, though had birthday cake last night . Cardiovascular risk reduction: o Current hypertensive regimen: metoprolol succinate 50 mg daily, losartan 25 mg daily - had been decreased by cardiology in May to 12.5 mg daily d/t presenting w/ hypotension to the appointment, but patient notes that she generally avoids drinking water before appointments to avoid urinary accidents. She self-increased back to losartan 25 mg daily and reports home readings (and documented clinic readings) remain at goal <130/90 and no SBP <100 o Current hyperlipidemia regimen: rosuvastatin 40 mg daily  o Current  antiplatelet regimen: Eliquis 2.5 mg BID (DVT ppx reduced dose) . Stage I breast cancer ER/PR positive HER-2 negative; follows w/ cancer center. On Arimadex 1 mg daily  . Overactive bladder: Myrbetriq 50 mg daily, provided samples at urology appt  Pharmacist Clinical Goal(s):  [name] Over the next 90 days, patient will work with PharmD and primary care provider to address optimized medication management  Interventions: . Comprehensive medication review performed, medication list updated in electronic medical record . Inter-disciplinary care team collaboration (see longitudinal plan of care) . Reviewed dosing of insulin pens. Patient only clicking to 2 units. Educated to click up to 20. She will increase this tonight. Scheduled f/u call next week for BG review and titration.  . Urinary accidents likely impacted by polyuria d/t hyperglycemia, which will improve w/ insulin titration. However, wonder if at this level of eGFR (<30), SGLT2 may  not be providing much glycemic benefit, and may be exacerbating polyuria. Discussed w/ Dr. Wynetta Emery, will d/c Jardiance at this time. . Encouraged to continue to monitor BP at home to ensure no hypotension, now that she is back to taking losartan 25 mg daily   Patient Self Care Activities:  . Patient will check blood glucose BID , document, and provide at future appointments . Patient will take medications as prescribed . Patient will report any questions or concerns to provider   Please see past updates related to this goal by clicking on the "Past Updates" button in the selected goal          Plan:  - Scheduled f/u call in 1 week  Catie Darnelle Maffucci, PharmD, [place] [phone]

## 2019-08-20 NOTE — Patient Instructions (Signed)
Visit Information  Goals Addressed            This Visit's Progress     Patient Stated   . "I want to keep feeling good" (pt-stated)       CARE PLAN ENTRY (see longtitudinal plan of care for additional care plan information)  Current Barriers:  . Diabetes: uncontrolled; complicated by chronic medical conditions including breast cancer, most recent A1c 9.8% o Frustrated by episodes of urinary incontinence and lack of improvement in blood sugars  . Most recent eGFR: ~27 mL/min . Current antihyperglycemic regimen: Jardiance 25 mg daily, Trulicity 3 mg weekly, glipizide 5 mg QAM, Tresiba 20 units daily - though has only been taking 2 units daily  . Denies hypoglycemic symptoms, including dizziness, lightheadedness, shaking, sweating . Current glucose readings:  o Fasting yesterday 155 (though reports she didn't eat much that day), this morning was 255, though had birthday cake last night . Cardiovascular risk reduction: o Current hypertensive regimen: metoprolol succinate 50 mg daily, losartan 25 mg daily - had been decreased by cardiology in May to 12.5 mg daily d/t presenting w/ hypotension to the appointment, but patient notes that she generally avoids drinking water before appointments to avoid urinary accidents. She self-increased back to losartan 25 mg daily and reports home readings (and documented clinic readings) remain at goal <130/90 and no SBP <100 o Current hyperlipidemia regimen: rosuvastatin 40 mg daily  o Current antiplatelet regimen: Eliquis 2.5 mg BID (DVT ppx reduced dose) . Stage I breast cancer ER/PR positive HER-2 negative; follows w/ cancer center. On Arimadex 1 mg daily  . Overactive bladder: Myrbetriq 50 mg daily, provided samples at urology appt  Pharmacist Clinical Goal(s):  . Over the next 90 days, patient will work with PharmD and primary care provider to address optimized medication management  Interventions: . Comprehensive medication review performed,  medication list updated in electronic medical record . Inter-disciplinary care team collaboration (see longitudinal plan of care) . Reviewed dosing of insulin pens. Patient only clicking to 2 units. Educated to click up to 20. She will increase this tonight. Scheduled f/u call next week for BG review and titration.  . Urinary accidents likely impacted by polyuria d/t hyperglycemia, which will improve w/ insulin titration. However, wonder if at this level of eGFR (<30), SGLT2 may not be providing much glycemic benefit, and may be exacerbating polyuria. Will discuss w/ Dr. Johnson.  . Encouraged to continue to monitor BP at home to ensure no hypotension, now that she is back to taking losartan 25 mg daily   Patient Self Care Activities:  . Patient will check blood glucose BID , document, and provide at future appointments . Patient will take medications as prescribed . Patient will report any questions or concerns to provider   Please see past updates related to this goal by clicking on the "Past Updates" button in the selected goal         Patient verbalizes understanding of instructions provided today.   Plan:  - Scheduled f/u call in 1 week  Catie Travis, PharmD, BCACP Clinical Pharmacist [facility] [phone]   

## 2019-08-20 NOTE — Addendum Note (Signed)
Addended by: De Hollingshead on: 08/20/2019 11:31 AM   Modules accepted: Orders

## 2019-08-21 ENCOUNTER — Telehealth: Payer: Self-pay | Admitting: Physician Assistant

## 2019-08-21 ENCOUNTER — Other Ambulatory Visit: Payer: Self-pay | Admitting: Physician Assistant

## 2019-08-21 LAB — CULTURE, URINE COMPREHENSIVE

## 2019-08-21 NOTE — Telephone Encounter (Signed)
Please contact the patient and inform her that her urine culture has come back negative.  She does not have a UTI.  She can stop antibiotics.  Please cancel her repeat lab visit with UA scheduled for next week, as this is no longer necessary.  Explain to the patient that given the blood in her urine in the absence of infection, I would like to first obtain a CT stone study (already ordered) to see if she is passing a stone.  If this does not show nephrolithiasis, I recommend that we proceed with a hematuria work-up.  However, given her renal insufficiency, IV contrast is not indicated and I will plan to order alternative imaging.

## 2019-08-21 NOTE — Telephone Encounter (Signed)
Notified patient as advised. Patient agreed to stop her ABX. Lab appt canceled. Informed patient of CT stone study. Patient agrees with this plan.

## 2019-08-25 ENCOUNTER — Other Ambulatory Visit: Payer: Self-pay | Admitting: Family Medicine

## 2019-08-25 ENCOUNTER — Other Ambulatory Visit: Payer: Self-pay | Admitting: Physician Assistant

## 2019-08-25 DIAGNOSIS — N3001 Acute cystitis with hematuria: Secondary | ICD-10-CM

## 2019-08-25 NOTE — Telephone Encounter (Signed)
Shouldn't be due until september

## 2019-08-25 NOTE — Telephone Encounter (Signed)
Requested medication (s) are due for refill today:  No requesting too soon  Requested medication (s) are on the active medication list:   Yes  Future visit scheduled:   Yes   Last ordered: 05/27/2019 #90 1 refill  Returned because there is not a protocol assigned to this medication.   Requested Prescriptions  Pending Prescriptions Disp Refills   trimethoprim (TRIMPEX) 100 MG tablet [Pharmacy Med Name: TRIMETHOPRIM 100 MG TAB] 90 tablet 1    Sig: TAKE ONE TABLET BY MOUTH EVERY DAY      Off-Protocol Failed - 08/25/2019  1:05 PM      Failed - Medication not assigned to a protocol, review manually.      Passed - Valid encounter within last 12 months    Recent Outpatient Visits           2 weeks ago Uncontrolled type 2 diabetes mellitus with complication, without long-term current use of insulin ([place])   [place], Megan P, DO   1 month ago Pleural effusion   [place], Megan P, DO   1 month ago Uncontrolled type 2 diabetes mellitus with complication, without long-term current use of insulin ([place])   [place], Megan P, DO   3 months ago Seasonal allergic rhinitis due to pollen   [facility], Megan P, DO   3 months ago Uncontrolled type 2 diabetes mellitus with complication, without long-term current use of insulin ([place])   [place], Megan P, DO       Future Appointments             In 1 week Johnson, Barb Merino, DO [employer]   In 2 weeks Gollan, [name], MD [place]   In 2 months Stoioff, Ronda Fairly, MD [place]   In 9 months [place], Ronda Fairly, [place]

## 2019-08-27 ENCOUNTER — Other Ambulatory Visit: Payer: Self-pay | Admitting: *Deleted

## 2019-08-27 ENCOUNTER — Ambulatory Visit: Payer: Medicare Other | Admitting: Pharmacist

## 2019-08-27 ENCOUNTER — Other Ambulatory Visit: Payer: Self-pay

## 2019-08-27 DIAGNOSIS — E1165 Type 2 diabetes mellitus with hyperglycemia: Secondary | ICD-10-CM | POA: Diagnosis not present

## 2019-08-27 DIAGNOSIS — IMO0002 Reserved for concepts with insufficient information to code with codable children: Secondary | ICD-10-CM

## 2019-08-27 DIAGNOSIS — E118 Type 2 diabetes mellitus with unspecified complications: Secondary | ICD-10-CM | POA: Diagnosis not present

## 2019-08-27 MED ORDER — GABAPENTIN 300 MG PO CAPS: 300.0000 mg | ORAL_CAPSULE | Freq: Three times a day (TID) | ORAL | 3 refills | Status: DC

## 2019-08-27 MED ORDER — ANASTROZOLE 1 MG PO TABS: 1.0000 mg | ORAL_TABLET | Freq: Every day | ORAL | 4 refills | Status: DC

## 2019-08-27 NOTE — Chronic Care Management (AMB) (Signed)
Chronic Care Management   Follow Up Note   08/27/2019 Name: SOLACE MANWARREN MRN: [id number] DOB: Jan 31, 1946  Referred by: [name], DO Reason for referral : Chronic Care Management (Medication Management)   CHARLYNN SALIH is a 74 y.o. year old female who is a primary care patient of [name], DO. The CCM team was consulted for assistance with chronic disease management and care coordination needs.    Contacted patient for medication management review.   Review of patient status, including review of consultants reports, relevant laboratory and other test results, and collaboration with appropriate care team members and the patient's provider was performed as part of comprehensive patient evaluation and provision of chronic care management services.    SDOH (Social Determinants of Health) assessments performed: No See Care Plan activities for detailed interventions related to [facility])     Outpatient Encounter Medications as of 08/27/2019  Medication Sig Note  . Dulaglutide (TRULICITY) 3 WI/0.9BD SOPN Inject 3 mg into the skin once a week.   [name] glipiZIDE (GLUCOTROL) 5 MG tablet TAKE ONE TABLET BY MOUTH EVERY DAY BEFORE BREAKFAST   . insulin degludec (TRESIBA FLEXTOUCH) 100 UNIT/ML FlexTouch Pen Inject 0.2 mLs (20 Units total) into the skin daily.   . mirabegron ER (MYRBETRIQ) 50 MG TB24 tablet Take 1 tablet (50 mg total) by mouth daily.   . montelukast (SINGULAIR) 10 MG tablet Take 1 tablet (10 mg total) by mouth at bedtime.   . Multiple Vitamins-Minerals (ZINC PO) Take 50 mg by mouth daily.    . Polyethylene Glycol 400 (BLINK TEARS) 0.25 % SOLN Place 1 drop into both eyes 3 (three) times daily as needed (dry/irritated eyes.).   [name] PROAIR HFA 108 (90 Base) MCG/ACT inhaler TAKE 2 PUFFS EVERY 4 HOURS AS NEEDED FORWHEEZING OR SHORTNESS OF BREATH 08/05/2019: Using ~once per week  . rosuvastatin (CRESTOR) 40 MG tablet TAKE 1 TABLET BY MOUTH DAILY   . anastrozole (ARIMIDEX) 1 MG tablet Take 1  tablet (1 mg total) by mouth daily.   [name] apixaban (ELIQUIS) 2.5 MG TABS tablet Take 1 tablet (2.5 mg) by mouth twice daily   . blood glucose meter kit and supplies KIT Dispense based on patient and insurance preference. Use one time daily as directed, E11.9   . Calcium Carb-Cholecalciferol (CALCIUM 600 + D) 600-200 MG-UNIT TABS Take 1 tablet by mouth daily.    . cetirizine (ZYRTEC) 10 MG tablet Take 1 tablet (10 mg total) by mouth daily.   . cholecalciferol (VITAMIN D3) 25 MCG (1000 UT) tablet Take 1,000 Units by mouth daily.   . DULoxetine (CYMBALTA) 60 MG capsule TAKE 1 CAPSULE BY MOUTH ONCE DAILY   . gabapentin (NEURONTIN) 300 MG capsule TAKE 1 CAPSULE BY MOUTH THREE TIMES DAILY (Patient taking differently: Take 300-600 mg by mouth See admin instructions. Take 1 capsule (300 mg) by mouth in the morning & take 2 capsules (600 mg) by mouth at night.)   . Insulin Pen Needle (PEN NEEDLES 3/16") 31G X 5 MM MISC 1 each by Does not apply route daily.   . isosorbide mononitrate (IMDUR) 60 MG 24 hr tablet TAKE 1 TABLET BY MOUTH AT BEDTIME AND 1/2 TABLET IN THE MORNING (Patient taking differently: Take 30-60 mg by mouth See admin instructions. Take 0.5 tablet (30 mg) by mouth in the morning & take 1 capsule (60 mg) by mouth at night.)   . levothyroxine (SYNTHROID) 150 MCG tablet Take 1 tablet (150 mcg total) by mouth daily  before breakfast.   . losartan (COZAAR) 25 MG tablet Take 0.5 tablets (12.5 mg total) by mouth daily. 08/20/2019: Taking 25 mg daily   . metoprolol succinate (TOPROL-XL) 50 MG 24 hr tablet TAKE 1 TABLET BY MOUTH DAILY WITH OR IMMEDIATLEY FOLLOWING A MEAL   . nitroGLYCERIN (NITROSTAT) 0.4 MG SL tablet Place 1 tablet (0.4 mg total) under the tongue every 5 (five) minutes as needed for chest pain.   [name] olopatadine (PATADAY) 0.1 % ophthalmic solution Place 1 drop into both eyes 2 (two) times daily.   . pantoprazole (PROTONIX) 40 MG tablet Take 1 tablet (40 mg total) by mouth daily as needed.   .  polyethylene glycol (MIRALAX MIX-IN PAX) 17 g packet Take 17 g by mouth daily.   [name] trimethoprim (TRIMPEX) 100 MG tablet Take 1 tablet (100 mg total) by mouth daily. (Patient not taking: Reported on 08/27/2019)    No facility-administered encounter medications on file as of 08/27/2019.     Objective:   Goals Addressed              This Visit's Progress     Patient Stated   .  "I want to keep feeling good" (pt-stated)        CARE PLAN ENTRY (see longtitudinal plan of care for additional care plan information)  Current Barriers:  . Diabetes: uncontrolled; complicated by chronic medical conditions including breast cancer, most recent A1c 9.8% . Most recent eGFR: ~27 mL/min . Current antihyperglycemic regimen: Trulicity 3 mg weekly, glipizide 5 mg QAM, Tresiba 20 units daily . Denies hypoglycemic symptoms, including dizziness, lightheadedness, shaking, sweating . Current glucose readings:  o Fastings in past few days 120-130s, excursion to 190 yesterday . Cardiovascular risk reduction: o Current hypertensive regimen: metoprolol succinate 50 mg daily, losartan 25 mg daily - had been decreased by cardiology in May to 12.5 mg daily d/t presenting w/ hypotension to the appointment, but patient notes that she generally avoids drinking water before appointments to avoid urinary accidents. She self-increased back to losartan 25 mg daily and reports home readings (and documented clinic readings) remain at goal <130/90 and no SBP <100 o Current hyperlipidemia regimen: rosuvastatin 40 mg daily  o Current antiplatelet regimen: Eliquis 2.5 mg BID (DVT ppx reduced dose) . Stage I breast cancer ER/PR positive HER-2 negative; follows w/ cancer center. On Arimadex 1 mg daily  . Overactive bladder: Myrbetriq 50 mg daily - trimethoprim 100 mg daily - reports that [facility] was unable to get this medication from their supplier. Notes that she stopped cephalexin as directed by urology, but her urine is still  foamy and she is worried about this  Pharmacist Clinical Goal(s):  [name] Over the next 90 days, patient will work with PharmD and primary care provider to address optimized medication management  Interventions: . Comprehensive medication review performed, medication list updated in electronic medical record . Inter-disciplinary care team collaboration (see longitudinal plan of care) . Encouraged continued adherence to [place] 20 units daily. F/u with PCP next week . Encouraged to contact urology if she still has concerns regarding urinary symptoms. She notes she also plans to discuss w/ PCP next week . Asks when another A1c can be checked to document improvement in sugars so that she can proceed w/ orthopedic surgery. Discussed 3 month lag. Encouraged to discuss w/ PCP to see if orthopedics ever take shorter measures of control (ie, fructosamine), as patient notes she is now in a wheelchair most of the time d/t knee  pain when walking  Patient Self Care Activities:  . Patient will check blood glucose BID , document, and provide at future appointments . Patient will take medications as prescribed . Patient will report any questions or concerns to provider   Please see past updates related to this goal by clicking on the "Past Updates" button in the selected goal          Plan:  - Scheduled f/u call in ~ 8 weeks  Catie Darnelle Maffucci, PharmD, [place] [phone]

## 2019-08-27 NOTE — Patient Instructions (Signed)
Visit Information  Goals Addressed              This Visit's Progress     Patient Stated   .  "I want to keep feeling good" (pt-stated)        CARE PLAN ENTRY (see longtitudinal plan of care for additional care plan information)  Current Barriers:  . Diabetes: uncontrolled; complicated by chronic medical conditions including breast cancer, most recent A1c 9.8% . Most recent eGFR: ~27 mL/min . Current antihyperglycemic regimen: Trulicity 3 mg weekly, glipizide 5 mg QAM, Tresiba 20 units daily . Denies hypoglycemic symptoms, including dizziness, lightheadedness, shaking, sweating . Current glucose readings:  o Fast[place] in past few days 120-130s, excursion to 190 yesterday . Cardiovascular risk reduction: o Current hypertensive regimen: metoprolol succinate 50 mg daily, losartan 25 mg daily - had been decreased by cardiology in May to 12.5 mg daily d/t presenting w/ hypotension to the appointment, but patient notes that she generally avoids drinking water before appointments to avoid urinary accidents. She self-increased back to losartan 25 mg daily and reports home readings (and documented clinic readings) remain at goal <130/90 and no SBP <100 o Current hyperlipidemia regimen: rosuvastatin 40 mg daily  o Current antiplatelet regimen: Eliquis 2.5 mg BID (DVT ppx reduced dose) . Stage I breast cancer ER/PR positive HER-2 negative; follows w/ cancer center. On Arimadex 1 mg daily  . Overactive bladder: Myrbetriq 50 mg daily - trimethoprim 100 mg daily - reports that [facility] was unable to get this medication from their supplier. Notes that she stopped cephalexin as directed by urology, but her urine is still foamy and she is worried about this  Pharmacist Clinical Goal(s):  [name] Over the next 90 days, patient will work with PharmD and primary care provider to address optimized medication management  Interventions: . Comprehensive medication review performed, medication list updated in  electronic medical record . Inter-disciplinary care team collaboration (see longitudinal plan of care) . Encouraged continued adherence to [place] 20 units daily. F/u with PCP next week . Encouraged to contact urology if she still has concerns regarding urinary symptoms. She notes she also plans to discuss w/ PCP next week . Asks when another A1c can be checked to document improvement in sugars so that she can proceed w/ orthopedic surgery. Discussed 3 month lag. Encouraged to discuss w/ PCP to see if orthopedics ever take shorter measures of control (ie, fructosamine), as patient notes she is now in a wheelchair most of the time d/t knee pain when walking  Patient Self Care Activities:  . Patient will check blood glucose BID , document, and provide at future appointments . Patient will take medications as prescribed . Patient will report any questions or concerns to provider   Please see past updates related to this goal by clicking on the "Past Updates" button in the selected goal         Patient verbalizes understanding of instructions provided today.   Plan:  - Scheduled f/u call in ~ 8 weeks  Catie Darnelle Maffucci, PharmD, [place] [phone]

## 2019-08-27 NOTE — Telephone Encounter (Signed)
Fax from [place] requesting Rx refill on gabapentin 300 mg cap

## 2019-08-28 ENCOUNTER — Other Ambulatory Visit: Payer: Self-pay

## 2019-09-02 ENCOUNTER — Other Ambulatory Visit: Payer: Self-pay

## 2019-09-02 ENCOUNTER — Ambulatory Visit (INDEPENDENT_AMBULATORY_CARE_PROVIDER_SITE_OTHER): Payer: Medicare Other | Admitting: Family Medicine

## 2019-09-02 ENCOUNTER — Encounter: Payer: Self-pay | Admitting: Family Medicine

## 2019-09-02 ENCOUNTER — Telehealth: Payer: Self-pay

## 2019-09-02 VITALS — BP 156/73 | HR 81 | Temp 98.5°F | Wt 218.2 lb

## 2019-09-02 DIAGNOSIS — IMO0002 Reserved for concepts with insufficient information to code with codable children: Secondary | ICD-10-CM

## 2019-09-02 DIAGNOSIS — Z1283 Encounter for screening for malignant neoplasm of skin: Secondary | ICD-10-CM

## 2019-09-02 DIAGNOSIS — E118 Type 2 diabetes mellitus with unspecified complications: Secondary | ICD-10-CM

## 2019-09-02 DIAGNOSIS — Z794 Long term (current) use of insulin: Secondary | ICD-10-CM

## 2019-09-02 DIAGNOSIS — I200 Unstable angina: Secondary | ICD-10-CM

## 2019-09-02 DIAGNOSIS — E1165 Type 2 diabetes mellitus with hyperglycemia: Secondary | ICD-10-CM

## 2019-09-02 MED ORDER — TRESIBA FLEXTOUCH 100 UNIT/ML ~~LOC~~ SOPN: 24.0000 [IU] | PEN_INJECTOR | Freq: Every day | SUBCUTANEOUS | 2 refills | Status: DC

## 2019-09-02 NOTE — Assessment & Plan Note (Signed)
Tolerating the insulin well. Still too early for her A1c. Will increase her insulin to 24 units daily and recheck 2 months.

## 2019-09-02 NOTE — Telephone Encounter (Signed)
Tammy [name] is faxing over the report.

## 2019-09-02 NOTE — Progress Notes (Signed)
BP (!) 156/73 (BP Location: Left Arm, Cuff Size: Large)   Pulse 81   Temp 98.5 F (36.9 C) (Oral)   Wt 218 lb 3.2 oz (99 kg)   SpO2 96%   BMI 31.31 kg/m    Subjective:    Patient ID: Tammy [name], female    DOB: 1946-02-05, 74 y.o.   MRN: [id number]  HPI: Tammy [name] is a 74 y.o. female  Chief Complaint  Patient presents with  . Diabetes  . spot on neck   DIABETES Hypoglycemic episodes:no Polydipsia/polyuria: no Visual disturbance: no Chest pain: no Paresthesias: no Glucose Monitoring: yes  Accucheck frequency: Daily  Fasting glucose: 160s, 180s Taking Insulin?: yes  Long acting insulin: tresiba Blood Pressure Monitoring: not checking Retinal Examination: Up to Date Foot Exam: Up to Date Diabetic Education: Completed Pneumovax: Up to Date Influenza: Up to Date Aspirin: yes  Got "jerky" a couple of weeks ago and then fell. This jerky feeling seems to happen   Relevant past medical, surgical, family and social history reviewed and updated as indicated. Interim medical history since our last visit reviewed. Allergies and medications reviewed and updated.  Review of Systems  Constitutional: Negative.   Respiratory: Negative.   Cardiovascular: Negative.   Musculoskeletal: Positive for arthralgias. Negative for back pain, gait problem, joint swelling, myalgias, neck pain and neck stiffness.  Skin: Negative.   Neurological: Negative.        Falls after twitching and jerking  Psychiatric/Behavioral: Negative.     Per HPI unless specifically indicated above     Objective:    BP (!) 156/73 (BP Location: Left Arm, Cuff Size: Large)   Pulse 81   Temp 98.5 F (36.9 C) (Oral)   Wt 218 lb 3.2 oz (99 kg)   SpO2 96%   BMI 31.31 kg/m   Wt Readings from Last 3 Encounters:  09/02/19 218 lb 3.2 oz (99 kg)  08/18/19 210 lb (95.3 kg)  08/01/19 210 lb (95.3 kg)    Physical Exam Vitals and nursing note reviewed.  Constitutional:      General: She is not in acute  distress.    Appearance: Normal appearance. She is obese. She is not ill-appearing, toxic-appearing or diaphoretic.  HENT:     Head: Normocephalic and atraumatic.     Right Ear: External ear normal.     Left Ear: External ear normal.     Nose: Nose normal.     Mouth/Throat:     Mouth: Mucous membranes are moist.     Pharynx: Oropharynx is clear.  Eyes:     General: No scleral icterus.       Right eye: No discharge.        Left eye: No discharge.     Extraocular Movements: Extraocular movements intact.     Conjunctiva/sclera: Conjunctivae normal.     Pupils: Pupils are equal, round, and reactive to light.  Cardiovascular:     Rate and Rhythm: Normal rate and regular rhythm.     Pulses: Normal pulses.     Heart sounds: Normal heart sounds. No murmur heard.  No friction rub. No gallop.   Pulmonary:     Effort: Pulmonary effort is normal. No respiratory distress.     Breath sounds: Normal breath sounds. No stridor. No wheezing, rhonchi or rales.  Chest:     Chest wall: No tenderness.  Musculoskeletal:        General: Normal range of motion.     Cervical back:  Normal range of motion and neck supple.  Skin:    General: Skin is warm and dry.     Capillary Refill: Capillary refill takes less than 2 seconds.     Coloration: Skin is not jaundiced or pale.     Findings: No bruising, erythema, lesion or rash.  Neurological:     General: No focal deficit present.     Mental Status: She is alert and oriented to person, place, and time. Mental status is at baseline.  Psychiatric:        Mood and Affect: Mood normal.        Behavior: Behavior normal.        Thought Content: Thought content normal.        Judgment: Judgment normal.     Results for orders placed or performed in visit on 08/18/19  CULTURE, URINE COMPREHENSIVE   Specimen: Urine   UR  Result Value Ref Range   Urine Culture, Comprehensive Final report    Organism ID, Bacteria Comment   Microscopic Examination   URINE    Result Value Ref Range   WBC, UA >30 (A) 0 - 5 /hpf   RBC >30 (A) 0 - 2 /hpf   Epithelial Cells (non renal) 0-10 0 - 10 /hpf   Bacteria, UA Few None seen/Few   Yeast, UA Present (A) None seen  Urinalysis, Complete  Result Value Ref Range   Specific Gravity, UA 1.015 1.005 - 1.030   pH, UA 5.5 5.0 - 7.5   Color, UA Yellow Yellow   Appearance Ur Cloudy (A) Clear   Leukocytes,UA Trace (A) Negative   Protein,UA 3+ (A) Negative/Trace   Glucose, UA 3+ (A) Negative   Ketones, UA Negative Negative   RBC, UA 3+ (A) Negative   Bilirubin, UA Negative Negative   Urobilinogen, Ur 0.2 0.2 - 1.0 mg/dL   Nitrite, UA Negative Negative   Microscopic Examination See below:   BLADDER SCAN AMB NON-IMAGING  Result Value Ref Range   Scan Result 1       Assessment & Plan:   Problem List Items Addressed This Visit      Endocrine   Uncontrolled type 2 diabetes mellitus with hyperglycemia, with long-term current use of insulin (HCC) - Primary    Tolerating the insulin well. Still too early for her A1c. Will increase her insulin to 24 units daily and recheck 2 months.       Relevant Medications   insulin degludec (TRESIBA FLEXTOUCH) 100 UNIT/ML FlexTouch Pen    Other Visit Diagnoses    Screening for skin cancer       Would like to see dermatology. Referral generated today.    Relevant Orders   Ambulatory referral to Dermatology       Follow up plan: Return in about 2 months (around 11/07/2019) for Wellness.

## 2019-09-02 NOTE — Telephone Encounter (Signed)
-----   Message from Dellia Cloud, [place] sent at 09/02/2019  3:16 PM EDT ----- [facility], diabetic eye exam

## 2019-09-03 ENCOUNTER — Telehealth: Payer: Self-pay

## 2019-09-03 ENCOUNTER — Ambulatory Visit (INDEPENDENT_AMBULATORY_CARE_PROVIDER_SITE_OTHER): Payer: Medicare Other

## 2019-09-03 DIAGNOSIS — R0600 Dyspnea, unspecified: Secondary | ICD-10-CM | POA: Diagnosis not present

## 2019-09-03 LAB — COMPREHENSIVE METABOLIC PANEL
ALT: 13 IU/L (ref 0–32)
AST: 15 IU/L (ref 0–40)
Albumin/Globulin Ratio: 1.4 (ref 1.2–2.2)
Albumin: 4.1 g/dL (ref 3.7–4.7)
Alkaline Phosphatase: 88 IU/L (ref 48–121)
BUN/Creatinine Ratio: 17 (ref 12–28)
BUN: 26 mg/dL (ref 8–27)
Bilirubin Total: 0.5 mg/dL (ref 0.0–1.2)
CO2: 27 mmol/L (ref 20–29)
Calcium: 9.5 mg/dL (ref 8.7–10.3)
Chloride: 102 mmol/L (ref 96–106)
Creatinine, Ser: 1.49 mg/dL — ABNORMAL HIGH (ref 0.57–1.00)
GFR calc Af Amer: 39 mL/min/{1.73_m2} — ABNORMAL LOW (ref >59)
GFR calc non Af Amer: 34 mL/min/{1.73_m2} — ABNORMAL LOW (ref >59)
Globulin, Total: 3 g/dL (ref 1.5–4.5)
Glucose: 173 mg/dL — ABNORMAL HIGH (ref 65–99)
Potassium: 5.1 mmol/L (ref 3.5–5.2)
Sodium: 140 mmol/L (ref 134–144)
Total Protein: 7.1 g/dL (ref 6.0–8.5)

## 2019-09-03 MED ORDER — PERFLUTREN LIPID MICROSPHERE
1.0000 mL | INTRAVENOUS | Status: AC | PRN
  Administered 2019-09-03: 2 mL via INTRAVENOUS

## 2019-09-03 NOTE — Telephone Encounter (Signed)
-----   Message from [name], DO sent at 09/02/2019  9:44 PM EDT ----- Dr. Harlow Mares at [facility] is wanting to do a knee replacement on her when her sugars are better. Does he want to wait for her A1c or would he be willing to do the surgery earlier if the sugars are running well?

## 2019-09-03 NOTE — Telephone Encounter (Signed)
Megan with [facility] returning call to [place]. Requesting call back.     She states that patient's AIC has to be down to 8 before scheduling surgery and maintained for 2-3 weeks.

## 2019-09-03 NOTE — Telephone Encounter (Signed)
Called [facility], Spoke with [place] Stated she would send a message and someone will call back.

## 2019-09-04 NOTE — Telephone Encounter (Signed)
Will need to be on the medicine for at least a month. I'd say middle of July

## 2019-09-04 NOTE — Telephone Encounter (Signed)
When can she come in to have her A1C rechecked?

## 2019-09-04 NOTE — Telephone Encounter (Signed)
Patient last A1C was 9.8 and she has an appointment in August. Does she need to come in before then or will it be to soon for the lab?

## 2019-09-04 NOTE — Telephone Encounter (Signed)
Yeah, she's only been on the meds for about 2 weeks. We can check earlier than 3 months, but can you please let her know they want her to wait a bit before surgery?

## 2019-09-04 NOTE — Telephone Encounter (Signed)
Labs only A1C scheduled for 09/25/19 at 2 pm

## 2019-09-05 ENCOUNTER — Ambulatory Visit
Admission: RE | Admit: 2019-09-05 | Discharge: 2019-09-05 | Disposition: A | Discharge status: Home / Self Care | Payer: Medicare Other | Source: Ambulatory Visit | Attending: Physician Assistant | Admitting: Physician Assistant

## 2019-09-05 ENCOUNTER — Other Ambulatory Visit: Payer: Self-pay

## 2019-09-05 DIAGNOSIS — N3001 Acute cystitis with hematuria: Secondary | ICD-10-CM | POA: Diagnosis present

## 2019-09-06 NOTE — Progress Notes (Signed)
Date:  09/08/2019   ID:  Tammy [name], DOB [birth year], MRN [id number]  Patient Location:  [place] [address]   Provider location:   [facility], [place] office  PCP:  [name], DO  Cardiologist:  [name] [facility]  Chief Complaint  Patient presents with  . Other    5 month follow up. Med reviewed verbally with patient.     History of Present Illness:    Tammy [name] is a 74 y.o. female  past medical history of coronary artery disease, occluded mid RCA, in 2013  Nonobstructive LAD disease PAD with angioplasty of the legs bilaterally,  COPD,  chronic kidney disease diabetes type 2, diagnosis of PE in October 2015, started on anticoagulation, eliquis hypertension,  back surgery January 2014 with rods placed at that time,  remote smoking history stopped 5 years ago,  obesity,  deconditioned secondary to chronic back pain  Recent catheterization January 2021 occluded RCA with collaterals nonobstructive LAD disease who presents for routine followup of her coronary artery disease   Echo 09/03/19 for SOB Left ventricular ejection fraction, by estimation, is 55 to 60%. The  left ventricle has normal function. The left ventricle has no regional  wall motion abnormalities. Left ventricular diastolic parameters are  consistent with Grade I diastolic  dysfunction (impaired relaxation).   Cardiac cath 04/04/2019 Nonobstructive disease, stable mild to moderate mid LAD disease Known occluded RCA with collaterals from left to right  Completed breast surgery with [name] after diagnosis of breast cancer  Scheduled for right knee surgery Waiting for HBA1C to come down Elevated from cortisone injections in her knee HBA1c 9.8 sugars 110 to 140, has changed her diet  No significant angina, SOB  Chronic back pain, stimulator in place (does not use it, does not work") On Cymbalta  EKG personally reviewed by myself on todays  visit NSR rate 72 bpm no ST or T wave changes  Other past medical hx reviewed Several back surgeries most recently March 2018  back surgery October 2017  Other past medical history Previously in the emergency room, treated for bronchial asthma. Takes her albuterol with some improvement of her symptoms. Shortness of breath symptoms have been dating back for many years.   may have developed a DVT from sitting long periods of time.    prior heart catheterizations in 2012 and 2013. Catheterization in 2012 documented occluded mid RCA with left to right collaterals, 40% mid LAD disease, 40% mid circumflex disease, 30% proximal LAD disease Catheterization in September 2013 showed normal LV function, occluded mid RCA with left to right collaterals, 50% mid and distal LAD disease   Prior CV studies:   The following studies were reviewed today:  Stress test 03/28/2018 Low risk  CT chest 02/2018   Past Medical History:  Diagnosis Date  . Asthma   . Benign neoplasm of colon   . Breast cancer ([place]) 08/2018   left breast  . Cervical disc disease    "bulging" - no limitations per pt  . Chronic diastolic CHF (congestive heart failure) ([place])    a. 07/2012 Echo: Nl EF. mild inferoseptal HK, Gr 2 DD, mild conc LVH, mild PR/TR, mild PAH.  . CKD (chronic kidney disease), stage III   . COPD (chronic obstructive pulmonary disease) ([facility])   . Coronary artery disease    a. 11/2011 Cath: LAD 39md, LCX min irregs, RCA 70p, 104mith L->R collats, EF  60%-->Med Rx.  . Diabetes mellitus without complication ([place])   . Fusion of lumbar spine 12/22/2015  . GERD (gastroesophageal reflux disease)   . History of DVT (deep vein thrombosis)   . History of tobacco abuse    a. Quit 2011.  [name] Hyperlipidemia   . Hypertension   . Hypothyroidism   . Obesity   . Palpitations   . PE (pulmonary embolism) 10/15  . Personal history of radiation therapy   . PONV (postoperative nausea and vomiting)   . Pulmonary  embolus ([place]) 12/25/2013   RLL. Presented with SOB and elevated d-dimer.   [name] PVD (peripheral vascular disease) ([place])    a. PTA of right leg and left femoral artery with stenosis.  . Renal insufficiency   . Stroke [facility]) 2015   TIAs - no deficits   Past Surgical History:  Procedure Laterality Date  . ANGIOPLASTY / STENTING FEMORAL     PVD; angioplasty right leg and left femoral artery with stenosis.   . APPENDECTOMY  Oct. 2016  . BACK SURGERY  03/2012   nerve stimulator inserted  . BACK SURGERY     screws, rods replaced with new hardware.  [name] BACK SURGERY  11/2016  . BREAST LUMPECTOMY Left 08/26/2018  . BREAST SURGERY    . CARDIAC CATHETERIZATION  12/07/2011   Mid LAD 50%, distal LAD 50%, mid RCA 70%, distal RCA 100%   . CARDIAC CATHETERIZATION  01/04/2011   100% occluded mid RCA with good collaterals from distal LAD, normal LVEF.  [name] CHOLECYSTECTOMY    . COLONOSCOPY  2013  . COLONOSCOPY WITH PROPOFOL N/A 05/24/2015   Procedure: COLONOSCOPY WITH PROPOFOL;  Surgeon: [name], MD;  Location: [place];  Service: Endoscopy;  Laterality: N/A;  Diabetic - oral meds  . COLONOSCOPY WITH PROPOFOL N/A 01/28/2019   Procedure: COLONOSCOPY WITH PROPOFOL;  Surgeon: [name], MD;  Location: [facility];  Service: Endoscopy;  Laterality: N/A;  . DIAGNOSTIC LAPAROSCOPY    . ESOPHAGOGASTRODUODENOSCOPY (EGD) WITH PROPOFOL N/A 01/28/2019   Procedure: ESOPHAGOGASTRODUODENOSCOPY (EGD) WITH PROPOFOL;  Surgeon: [name], MD;  Location: [facility];  Service: Endoscopy;  Laterality: N/A;  . LAPAROSCOPIC APPENDECTOMY N/A 01/06/2015   Procedure: APPENDECTOMY LAPAROSCOPIC;  Surgeon: [name], MD;  Location: [facility];  Service: General;  Laterality: N/A;  . LEFT HEART CATH AND CORONARY ANGIOGRAPHY Left 04/04/2019   Procedure: LEFT HEART CATH AND CORONARY ANGIOGRAPHY;  Surgeon: [name], MD;  Location: [place] [facility];  Service: Cardiovascular;   Laterality: Left;  [name] MASTECTOMY    . MASTECTOMY W/ SENTINEL NODE BIOPSY Left 08/26/2018   Procedure: PARTIAL MASTECTOMY WIDE EXCISION WITH SENTINEL LYMPH NODE BIOPSY LEFT;  Surgeon: [name], MD;  Location: [facility];  Service: General;  Laterality: Left;  . POLYPECTOMY  05/24/2015   Procedure: POLYPECTOMY;  Surgeon: [name], MD;  Location: [place];  Service: Endoscopy;;  . THYROIDECTOMY    . TRIGGER FINGER RELEASE       Current Meds  Medication Sig  . anastrozole (ARIMIDEX) 1 MG tablet Take 1 tablet (1 mg total) by mouth daily.  [name] apixaban (ELIQUIS) 2.5 MG TABS tablet Take 1 tablet (2.5 mg) by mouth twice daily  . blood glucose meter kit and supplies KIT Dispense based on patient and insurance preference. Use one time daily as directed, E11.9  . Calcium Carb-Cholecalciferol (CALCIUM 600 + D) 600-200 MG-UNIT TABS Take 1 tablet by mouth daily.   . cetirizine (ZYRTEC)  10 MG tablet Take 1 tablet (10 mg total) by mouth daily.  . cholecalciferol (VITAMIN D3) 25 MCG (1000 UT) tablet Take 1,000 Units by mouth daily.  . Dulaglutide (TRULICITY) 3 TU/8.8KC SOPN Inject 3 mg into the skin once a week.  . DULoxetine (CYMBALTA) 60 MG capsule TAKE 1 CAPSULE BY MOUTH ONCE DAILY  . gabapentin (NEURONTIN) 300 MG capsule Take 1 capsule (300 mg total) by mouth 3 (three) times daily.  [name] glipiZIDE (GLUCOTROL) 5 MG tablet TAKE ONE TABLET BY MOUTH EVERY DAY BEFORE BREAKFAST  . insulin degludec (TRESIBA FLEXTOUCH) 100 UNIT/ML FlexTouch Pen Inject 0.24 mLs (24 Units total) into the skin daily.  . Insulin Pen Needle (PEN NEEDLES 3/16") 31G X 5 MM MISC 1 each by Does not apply route daily.  . isosorbide mononitrate (IMDUR) 60 MG 24 hr tablet TAKE 1 TABLET BY MOUTH AT BEDTIME AND 1/2 TABLET IN THE MORNING (Patient taking differently: Take 30-60 mg by mouth See admin instructions. Take 0.5 tablet (30 mg) by mouth in the morning & take 1 capsule (60 mg) by mouth at night.)  . levothyroxine  (SYNTHROID) 150 MCG tablet Take 1 tablet (150 mcg total) by mouth daily before breakfast.  . losartan (COZAAR) 25 MG tablet Take 0.5 tablets (12.5 mg total) by mouth daily.  . metoprolol succinate (TOPROL-XL) 50 MG 24 hr tablet TAKE 1 TABLET BY MOUTH DAILY WITH OR IMMEDIATLEY FOLLOWING A MEAL  . mirabegron ER (MYRBETRIQ) 50 MG TB24 tablet Take 1 tablet (50 mg total) by mouth daily.  . montelukast (SINGULAIR) 10 MG tablet Take 1 tablet (10 mg total) by mouth at bedtime.  . Multiple Vitamins-Minerals (ZINC PO) Take 50 mg by mouth daily.   . nitroGLYCERIN (NITROSTAT) 0.4 MG SL tablet Place 1 tablet (0.4 mg total) under the tongue every 5 (five) minutes as needed for chest pain.  [name] olopatadine (PATADAY) 0.1 % ophthalmic solution Place 1 drop into both eyes 2 (two) times daily.  . pantoprazole (PROTONIX) 40 MG tablet Take 1 tablet (40 mg total) by mouth daily as needed.  . polyethylene glycol (MIRALAX MIX-IN PAX) 17 g packet Take 17 g by mouth daily.  . Polyethylene Glycol 400 (BLINK TEARS) 0.25 % SOLN Place 1 drop into both eyes 3 (three) times daily as needed (dry/irritated eyes.).  [name] PROAIR HFA 108 (90 Base) MCG/ACT inhaler TAKE 2 PUFFS EVERY 4 HOURS AS NEEDED FORWHEEZING OR SHORTNESS OF BREATH  . rosuvastatin (CRESTOR) 40 MG tablet TAKE 1 TABLET BY MOUTH DAILY     Allergies:   Hydrocodone, Aleve [naproxen sodium], Contrast media [iodinated diagnostic agents], Ioxaglate, Oxycodone, Ranexa [ranolazine], Sulfa antibiotics, and Tramadol   Social History   Tobacco Use  . Smoking status: Former Smoker    Packs/day: 0.50    Years: 15.00    Pack years: 7.50    Types: Cigarettes    Quit date: 03/14/2007    Years since quitting: 12.4  . Smokeless tobacco: Never Used  Vaping Use  . Vaping Use: Never used  Substance Use Topics  . Alcohol use: No    Alcohol/week: 0.0 standard drinks  . Drug use: Never     Family Hx: The patient's family history includes COPD in her sister and sister; Cancer in  her mother and sister; Diabetes in her brother, brother, brother, and father; Heart attack in her brother, brother, brother, and father; Heart disease in her brother, brother, brother, and father; Hypertension in her brother, brother, brother, and father. There is no history  of Breast cancer.  ROS:   Please see the history of present illness.    Review of Systems  Constitutional: Negative.   HENT: Negative.   Respiratory: Negative.   Cardiovascular: Negative.   Gastrointestinal: Negative.   Musculoskeletal: Negative.   Neurological: Negative.   Psychiatric/Behavioral: Negative.   All other systems reviewed and are negative.    Labs/Other Tests and Data Reviewed:    Recent Labs: 05/20/2019: TSH 4.680 07/17/2019: Hemoglobin 12.8; Platelets 270 09/02/2019: ALT 13; BUN 26; Creatinine, Ser 1.49; Potassium 5.1; Sodium 140   Recent Lipid Panel Lab Results  Component Value Date/Time   CHOL 158 07/08/2019 03:45 PM   CHOL 152 01/31/2016 02:19 PM   TRIG 319 (H) 07/08/2019 03:45 PM   TRIG 224 (H) 01/31/2016 02:19 PM   HDL 50 07/08/2019 03:45 PM   CHOLHDL 3.2 09/07/2015 08:05 AM   CHOLHDL 3.4 12/17/2014 10:17 AM   LDLCALC 59 07/08/2019 03:45 PM    Wt Readings from Last 3 Encounters:  09/08/19 217 lb 2 oz (98.5 kg)  09/02/19 218 lb 3.2 oz (99 kg)  08/18/19 210 lb (95.3 kg)     Exam:    Vital Signs: Vital signs may also be detailed in the HPI BP 110/60 (BP Location: Right Arm, Patient Position: Sitting, Cuff Size: Normal)   Pulse 72   Ht 5' 6"  (1.676 m)   Wt 217 lb 2 oz (98.5 kg)   SpO2 96%   BMI 35.04 kg/m   Presenting in a wheelchair Constitutional:  oriented to person, place, and time. No distress.  HENT:  Head: Grossly normal Eyes:  no discharge. No scleral icterus.  Neck: No JVD, no carotid bruits  Cardiovascular: Regular rate and rhythm, no murmurs appreciated Pulmonary/Chest: Clear to auscultation bilaterally, no wheezes or rails Abdominal: Soft.  no distension.  no  tenderness.  Musculoskeletal: Normal range of motion Neurological:  normal muscle tone. Coordination normal. No atrophy Skin: Skin warm and dry Psychiatric: normal affect, pleasant   ASSESSMENT & PLAN:    Preop cardiovascular for knee surgery Acceptable risk for surgery no further testing needed  Abdominal aortic atherosclerosis  cholesterol at goal Continue aggressive management  PAD (peripheral artery disease) (HCC) Aggressive lipid management recommended  Atherosclerosis of native coronary artery of native heart with stable angina pectoris ([place]) Currently with no symptoms of angina. No further workup at this time. Continue current medication regimen.  Morbid obesity due to excess calories ([place]) With diagnosis of type 2 diabetes, peripheral arterial disease, essential hypertension, hyperlipidemia, aortic atherosclerosis  Essential hypertension Blood pressure is well controlled on today's visit. No changes made to the medications.  Uncontrolled type 2 diabetes mellitus with complication, without long-term current use of insulin (HCC) Elevated from cortisone, should be coming down  Mixed hyperlipidemia Cholesterol is at goal on the current lipid regimen. No changes to the medications were made.  History of DVT and PE Continue Eliquis dosing down to 2.5 mg twice daily for prophylactic dosing   Total encounter time more than 25 minutes  Greater than 50% was spent in counseling and coordination of care with the patient   Disposition: Follow-up in 12 months   Signed, Ida Rogue, MD  09/08/2019 2:35 PM    [place] [id number], [place], [place] [address]

## 2019-09-08 ENCOUNTER — Ambulatory Visit (INDEPENDENT_AMBULATORY_CARE_PROVIDER_SITE_OTHER): Payer: Medicare Other | Admitting: Cardiovascular Disease

## 2019-09-08 ENCOUNTER — Other Ambulatory Visit: Payer: Self-pay

## 2019-09-08 ENCOUNTER — Encounter: Payer: Self-pay | Admitting: Cardiovascular Disease

## 2019-09-08 VITALS — BP 110/60 | HR 72 | Ht 66.0 in | Wt 217.1 lb

## 2019-09-08 DIAGNOSIS — I739 Peripheral vascular disease, unspecified: Secondary | ICD-10-CM | POA: Diagnosis not present

## 2019-09-08 DIAGNOSIS — I77819 Aortic ectasia, unspecified site: Secondary | ICD-10-CM | POA: Diagnosis not present

## 2019-09-08 DIAGNOSIS — I200 Unstable angina: Secondary | ICD-10-CM

## 2019-09-08 DIAGNOSIS — I700 Atherosclerosis of aorta: Secondary | ICD-10-CM

## 2019-09-08 DIAGNOSIS — I25118 Atherosclerotic heart disease of native coronary artery with other forms of angina pectoris: Secondary | ICD-10-CM | POA: Diagnosis not present

## 2019-09-08 DIAGNOSIS — E118 Type 2 diabetes mellitus with unspecified complications: Secondary | ICD-10-CM

## 2019-09-08 NOTE — Patient Instructions (Signed)

## 2019-09-09 ENCOUNTER — Other Ambulatory Visit: Payer: Self-pay | Admitting: Family Medicine

## 2019-09-09 NOTE — Telephone Encounter (Signed)
Requested Prescriptions  Pending Prescriptions Disp Refills  . isosorbide mononitrate (IMDUR) 60 MG 24 hr tablet [Pharmacy Med Name: ISOSORBIDE MONONITRATE ER 60 MG TAB] 135 tablet 3    Sig: TAKE ONE TABLET BY MOUTH AT BEDTIME AND HALF A TABLET EVERY MORNING     Cardiovascular:  Nitrates Passed - 09/09/2019  8:46 AM      Passed - Last BP in normal range    BP Readings from Last 1 Encounters:  09/08/19 110/60         Passed - Last Heart Rate in normal range    Pulse Readings from Last 1 Encounters:  09/08/19 72         Passed - Valid encounter within last 12 months    Recent Outpatient Visits          1 week ago Uncontrolled type 2 diabetes mellitus with complication, without long-term current use of insulin ([place])   [place], Megan P, DO   1 month ago Uncontrolled type 2 diabetes mellitus with complication, without long-term current use of insulin ([place])   [place], Megan P, DO   1 month ago Pleural effusion   [facility] [place], Megan P, DO   2 months ago Uncontrolled type 2 diabetes mellitus with complication, without long-term current use of insulin ([place])   [facility] [place], Megan P, DO   3 months ago Seasonal allergic rhinitis due to pollen   [facility], Barb Merino, DO      Future Appointments            In 2 months Wynetta Emery, Barb Merino, DO [facility]   In 2 months [place], Ronda Fairly, MD [facility]   In 3 months Ralene Bathe, MD [place]   In 8 months [place], Ronda Fairly, MD [facility]

## 2019-09-10 ENCOUNTER — Telehealth: Payer: Self-pay | Admitting: Physician Assistant

## 2019-09-10 ENCOUNTER — Telehealth: Payer: Self-pay | Admitting: Family Medicine

## 2019-09-10 DIAGNOSIS — R3129 Other microscopic hematuria: Secondary | ICD-10-CM

## 2019-09-10 NOTE — Telephone Encounter (Signed)
That was originally recommended by urology and we only continued it. I would advise her to reach out to her urologist to see if they have another suggestion.

## 2019-09-10 NOTE — Telephone Encounter (Signed)
Copied from [place] [id number]. Topic: General - Other >> Sep 10, 2019  3:53 PM Leward Quan A wrote: Reason for CRM: A rep with [facility] [place] Phone: [phone]   Fax:  [phone] called to inform Dr Wynetta Emery that trimethoprim (TRIMPEX) 100 MG tablet is no longer available anywhere and she gave her 12 tablets on 08/25/19 but now patient need a replacement Rx. Please advise

## 2019-09-10 NOTE — Telephone Encounter (Signed)
Routing to provider to advise.  

## 2019-09-10 NOTE — Telephone Encounter (Signed)
I just spoke with the patient via telephone.  I explained that her CT stone study did not reveal a kidney stone.  Patient reports continued pain with urination.  Based on these findings, I explained that I would like to proceed with an MR urogram for evaluation of her upper urinary tract given her history of renal insufficiency with baseline eGFR <30.  Patient reports that she does have a spinal stimulator implanted, however this comes with an MRI mode.  I suspect it will be MRI compatible.  MR urogram orders placed today.  Please contact the patient to schedule her for follow-up cystoscopy with urogram results with Dr. Bernardo Heater in approximately 1 month.

## 2019-09-11 ENCOUNTER — Other Ambulatory Visit: Payer: Self-pay | Admitting: Family Medicine

## 2019-09-11 NOTE — Telephone Encounter (Signed)
   Notes to clinic: Please verify that provider is unable to fill this medication    Requested Prescriptions  Pending Prescriptions Disp Refills   trimethoprim (TRIMPEX) 100 MG tablet [Pharmacy Med Name: TRIMETHOPRIM 100 MG TAB] 90 tablet     Sig: TAKE ONE TABLET BY MOUTH EVERY DAY      Off-Protocol Failed - 09/11/2019  2:01 PM      Failed - Medication not assigned to a protocol, review manually.      Passed - Valid encounter within last 12 months    Recent Outpatient Visits           1 week ago Uncontrolled type 2 diabetes mellitus with complication, without long-term current use of insulin ([place])   [place], Megan P, DO   1 month ago Uncontrolled type 2 diabetes mellitus with complication, without long-term current use of insulin ([place])   [place], Megan P, DO   1 month ago Pleural effusion   [place], Megan P, DO   2 months ago Uncontrolled type 2 diabetes mellitus with complication, without long-term current use of insulin ([place])   [place], Megan P, DO   3 months ago Seasonal allergic rhinitis due to pollen   [facility], Barb Merino, DO       Future Appointments             In 2 months Wynetta Emery, Barb Merino, DO [facility]   In 2 months [place], Ronda Fairly, MD [facility]   In 3 months Ralene Bathe, MD [place]   In 8 months [place], Ronda Fairly, MD [place]

## 2019-09-11 NOTE — Telephone Encounter (Signed)
Patient notified

## 2019-09-11 NOTE — Telephone Encounter (Signed)
Patient scheduled for cysto with Dr Bernardo Heater for 8/4 @ 11 AM. Patient notified, appt reminder printed and mailed.

## 2019-09-11 NOTE — Telephone Encounter (Signed)
Requested medication (s) are due for refill today: yes  Requested medication (s) are on the active medication list: yes  Last refill: 05/27/2019  Future visit scheduled:yes  Notes to clinic:  Medication not assigned to a protocol, review manually   Requested Prescriptions  Pending Prescriptions Disp Refills   trimethoprim (TRIMPEX) 100 MG tablet [Pharmacy Med Name: TRIMETHOPRIM 100 MG TAB] 90 tablet     Sig: TAKE ONE TABLET BY MOUTH EVERY DAY      Off-Protocol Failed - 09/11/2019 10:19 AM      Failed - Medication not assigned to a protocol, review manually.      Passed - Valid encounter within last 12 months    Recent Outpatient Visits           1 week ago Uncontrolled type 2 diabetes mellitus with complication, without long-term current use of insulin ([place])   [place], Megan P, DO   1 month ago Uncontrolled type 2 diabetes mellitus with complication, without long-term current use of insulin ([place])   [place], Megan P, DO   1 month ago Pleural effusion   [place], Megan P, DO   2 months ago Uncontrolled type 2 diabetes mellitus with complication, without long-term current use of insulin ([place])   [place], Megan P, DO   3 months ago Seasonal allergic rhinitis due to pollen   [facility], Barb Merino, DO       Future Appointments             In 2 months Wynetta Emery, Barb Merino, DO [facility]   In 2 months [place], Ronda Fairly, MD [facility]   In 3 months Ralene Bathe, MD [place]   In 8 months [place], Ronda Fairly, MD [place]

## 2019-09-11 NOTE — Telephone Encounter (Signed)
Spoke with pt and she had already spoke with the pharmacy. She stated that she has a follow up with urology in August and they will discuss medications at that time.

## 2019-09-17 ENCOUNTER — Other Ambulatory Visit: Payer: Self-pay | Admitting: Cardiovascular Disease

## 2019-09-17 NOTE — Telephone Encounter (Signed)
Pt's age 74, wt 98.5 kg, SCr 1.49, CrCl 49.95, last ov w/ TG: History of DVT and PE Continue Eliquis dosing down to 2.5 mg twice daily for prophylactic dosing

## 2019-09-17 NOTE — Telephone Encounter (Signed)
Refill request

## 2019-09-25 ENCOUNTER — Other Ambulatory Visit: Payer: Self-pay

## 2019-09-25 ENCOUNTER — Other Ambulatory Visit: Payer: Medicare Other

## 2019-09-25 DIAGNOSIS — E118 Type 2 diabetes mellitus with unspecified complications: Secondary | ICD-10-CM | POA: Diagnosis not present

## 2019-09-25 DIAGNOSIS — E1165 Type 2 diabetes mellitus with hyperglycemia: Secondary | ICD-10-CM | POA: Diagnosis not present

## 2019-09-25 DIAGNOSIS — IMO0002 Reserved for concepts with insufficient information to code with codable children: Secondary | ICD-10-CM

## 2019-09-25 DIAGNOSIS — C50312 Malignant neoplasm of lower-inner quadrant of left female breast: Secondary | ICD-10-CM

## 2019-09-25 LAB — BAYER DCA HB A1C WAIVED: HB A1C (BAYER DCA - WAIVED): 8.1 % — ABNORMAL HIGH (ref <7.0)

## 2019-09-26 ENCOUNTER — Other Ambulatory Visit: Payer: Self-pay | Admitting: Family Medicine

## 2019-09-26 DIAGNOSIS — IMO0002 Reserved for concepts with insufficient information to code with codable children: Secondary | ICD-10-CM

## 2019-09-26 DIAGNOSIS — E1165 Type 2 diabetes mellitus with hyperglycemia: Secondary | ICD-10-CM

## 2019-10-01 ENCOUNTER — Other Ambulatory Visit: Payer: Self-pay | Admitting: Family Medicine

## 2019-10-07 ENCOUNTER — Telehealth: Payer: Self-pay

## 2019-10-07 NOTE — Telephone Encounter (Signed)
Pt left voicemail on triage line stating she was called by the imaging department and told she could not have an MRI based on a device she has placed in her back. Pt would like to know the next steps or how issue can be resolved. Please advise.

## 2019-10-08 ENCOUNTER — Other Ambulatory Visit: Payer: Medicare Other

## 2019-10-08 ENCOUNTER — Ambulatory Visit: Payer: Medicare Other

## 2019-10-09 ENCOUNTER — Ambulatory Visit: Payer: Medicare Other

## 2019-10-10 ENCOUNTER — Encounter (INDEPENDENT_AMBULATORY_CARE_PROVIDER_SITE_OTHER): Payer: Self-pay | Admitting: Vascular Surgery

## 2019-10-10 ENCOUNTER — Ambulatory Visit (INDEPENDENT_AMBULATORY_CARE_PROVIDER_SITE_OTHER): Payer: Medicare Other

## 2019-10-10 ENCOUNTER — Other Ambulatory Visit: Payer: Self-pay

## 2019-10-10 ENCOUNTER — Ambulatory Visit (INDEPENDENT_AMBULATORY_CARE_PROVIDER_SITE_OTHER): Payer: Medicare Other | Admitting: Vascular Surgery

## 2019-10-10 VITALS — BP 116/77 | HR 69 | Resp 16

## 2019-10-10 DIAGNOSIS — Z794 Long term (current) use of insulin: Secondary | ICD-10-CM

## 2019-10-10 DIAGNOSIS — E782 Mixed hyperlipidemia: Secondary | ICD-10-CM | POA: Diagnosis not present

## 2019-10-10 DIAGNOSIS — I739 Peripheral vascular disease, unspecified: Secondary | ICD-10-CM

## 2019-10-10 DIAGNOSIS — I200 Unstable angina: Secondary | ICD-10-CM | POA: Diagnosis not present

## 2019-10-10 DIAGNOSIS — E1165 Type 2 diabetes mellitus with hyperglycemia: Secondary | ICD-10-CM | POA: Diagnosis not present

## 2019-10-10 NOTE — Progress Notes (Signed)
MRN : [id number]  Tammy [name] is a 74 y.o. (1946/01/25) female who presents with chief complaint of  Chief Complaint  Patient presents with  . Follow-up    ultrasound follow up  .  History of Present Illness: Patient returns today in follow up of her PAD.  She has undergone bilateral lower extremity revascularizations almost a decade ago.  Over the past several months, her legs are hurting her much more with walking.  She needs knee replacements and that may be a contributing factor.  No new ulceration or infection.  ABIs today were actually up on both sides to 0.95 on the right and 0.88 on the left.  Her toe brachial indices were also good and she had good waveforms distally.  Current Outpatient Medications  Medication Sig Dispense Refill  . anastrozole (ARIMIDEX) 1 MG tablet Take 1 tablet (1 mg total) by mouth daily. 30 tablet 4  . blood glucose meter kit and supplies KIT Dispense based on patient and insurance preference. Use one time daily as directed, E11.9 1 each 0  . Calcium Carb-Cholecalciferol (CALCIUM 600 + D) 600-200 MG-UNIT TABS Take 1 tablet by mouth daily.     . cetirizine (ZYRTEC) 10 MG tablet Take 1 tablet (10 mg total) by mouth daily. 30 tablet 11  . cholecalciferol (VITAMIN D3) 25 MCG (1000 UT) tablet Take 1,000 Units by mouth daily.    . Dulaglutide (TRULICITY) 3 QD/8.2ME SOPN Inject 3 mg into the skin once a week. 4 pen 6  . DULoxetine (CYMBALTA) 60 MG capsule TAKE 1 CAPSULE BY MOUTH ONCE DAILY 90 capsule 1  . ELIQUIS 2.5 MG TABS tablet TAKE 1 TABLET BY MOUTH TWICE DAILY 180 tablet 1  . gabapentin (NEURONTIN) 300 MG capsule Take 1 capsule (300 mg total) by mouth 3 (three) times daily. 270 capsule 3  . glipiZIDE (GLUCOTROL) 5 MG tablet TAKE ONE TABLET BY MOUTH EVERY DAY BEFORE BREAKFAST 90 tablet 0  . insulin degludec (TRESIBA FLEXTOUCH) 100 UNIT/ML FlexTouch Pen Inject 0.24 mLs (24 Units total) into the skin daily. 4 pen 2  . Insulin Pen Needle (PEN NEEDLES 3/16") 31G X  5 MM MISC 1 each by Does not apply route daily. 100 each 12  . isosorbide mononitrate (IMDUR) 60 MG 24 hr tablet TAKE ONE TABLET BY MOUTH AT BEDTIME AND HALF A TABLET EVERY MORNING 135 tablet 3  . levothyroxine (SYNTHROID) 150 MCG tablet Take 1 tablet (150 mcg total) by mouth daily before breakfast. 30 tablet 2  . losartan (COZAAR) 25 MG tablet Take 0.5 tablets (12.5 mg total) by mouth daily. 45 tablet 3  . metoprolol succinate (TOPROL-XL) 50 MG 24 hr tablet TAKE 1 TABLET BY MOUTH DAILY WITH OR IMMEDIATLEY FOLLOWING A MEAL 90 tablet 1  . mirabegron ER (MYRBETRIQ) 50 MG TB24 tablet Take 1 tablet (50 mg total) by mouth daily. 30 tablet 2  . montelukast (SINGULAIR) 10 MG tablet TAKE ONE TABLET BY MOUTH AT BEDTIME 30 tablet 3  . Multiple Vitamins-Minerals (ZINC PO) Take 50 mg by mouth daily.     . nitroGLYCERIN (NITROSTAT) 0.4 MG SL tablet Place 1 tablet (0.4 mg total) under the tongue every 5 (five) minutes as needed for chest pain. 25 tablet 6  . olopatadine (PATADAY) 0.1 % ophthalmic solution Place 1 drop into both eyes 2 (two) times daily. 5 mL 12  . pantoprazole (PROTONIX) 40 MG tablet Take 1 tablet (40 mg total) by mouth daily as needed. 90 tablet 1  .  polyethylene glycol (MIRALAX MIX-IN PAX) 17 g packet Take 17 g by mouth daily. 30 each 1  . Polyethylene Glycol 400 (BLINK TEARS) 0.25 % SOLN Place 1 drop into both eyes 3 (three) times daily as needed (dry/irritated eyes.).    [name] PROAIR HFA 108 (90 Base) MCG/ACT inhaler TAKE 2 PUFFS EVERY 4 HOURS AS NEEDED FORWHEEZING OR SHORTNESS OF BREATH 8.5 g 3  . rosuvastatin (CRESTOR) 40 MG tablet TAKE 1 TABLET BY MOUTH DAILY 90 tablet 1  . trimethoprim (TRIMPEX) 100 MG tablet Take 100 mg by mouth daily.     No current facility-administered medications for this visit.    Past Medical History:  Diagnosis Date  . Asthma   . Benign neoplasm of colon   . Breast cancer ([place]) 08/2018   left breast  . Cervical disc disease    "bulging" - no limitations per  pt  . Chronic diastolic CHF (congestive heart failure) ([place])    a. 07/2012 Echo: Nl EF. mild inferoseptal HK, Gr 2 DD, mild conc LVH, mild PR/TR, mild PAH.  . CKD (chronic kidney disease), stage III   . COPD (chronic obstructive pulmonary disease) ([place])   . Coronary artery disease    a. 11/2011 Cath: LAD 56md, LCX min irregs, RCA 70p, 1034mith L->R collats, EF 60%-->Med Rx.  . Diabetes mellitus without complication ([place]  . Fusion of lumbar spine 12/22/2015  . GERD (gastroesophageal reflux disease)   . History of DVT (deep vein thrombosis)   . History of tobacco abuse    a. Quit 2011.  . Marland Kitchenyperlipidemia   . Hypertension   . Hypothyroidism   . Obesity   . Palpitations   . PE (pulmonary embolism) 10/15  . Personal history of radiation therapy   . PONV (postoperative nausea and vomiting)   . [place] [place]   R[place]. Presented with SOB and elevated d-dimer.   . Marland KitchenVD (peripheral vascular disease) ([facility]   a. PTA of right leg and left femoral artery with stenosis.  . Renal insufficiency   . [facility]   TIAs - no deficits    Past Surgical History:  Procedure Laterality Date  . ANGIOPLASTY / STENTING FEMORAL     PVD; angioplasty right leg and left femoral artery with stenosis.   . APPENDECTOMY  Oct. 2016  . BACK SURGERY  03/2012   nerve stimulator inserted  . BACK SURGERY     screws, rods replaced with new hardware.  . Marland KitchenACK SURGERY  11/2016  . BREAST LUMPECTOMY Left 08/26/2018  . BREAST SURGERY    . CARDIAC CATHETERIZATION  12/07/2011   Mid LAD 50%, distal LAD 50%, mid RCA 70%, distal RCA 100%   . CARDIAC CATHETERIZATION  01/04/2011   100% occluded mid RCA with good collaterals from distal LAD, normal LVEF.  . Marland KitchenHOLECYSTECTOMY    . COLONOSCOPY  2013  . COLONOSCOPY WITH PROPOFOL N/A 05/24/2015   Procedure: COLONOSCOPY WITH PROPOFOL;  Surgeon: DaLucilla LameMD;  [place] Service: [place];  Laterality: N/A;  Diabetic - oral meds  .  COLONOSCOPY WITH PROPOFOL N/A 01/28/2019   Procedure: COLONOSCOPY WITH PROPOFOL;  Surgeon: TaVirgel ManifoldMD;  Location: [facility]COPY;  Service: Endoscopy;  Laterality: N/A;  . DIAGNOSTIC LAPAROSCOPY    . ESOPHAGOGASTRODUODENOSCOPY (EGD) WITH PROPOFOL N/A 01/28/2019   Procedure: ESOPHAGOGASTRODUODENOSCOPY (EGD) WITH PROPOFOL;  Surgeon: TaVirgel ManifoldMD;  Location: [facility] ENDOSCOPY;  Service: Endoscopy;  Laterality: N/A;  . LAPAROSCOPIC APPENDECTOMY  N/A 01/06/2015   Procedure: APPENDECTOMY LAPAROSCOPIC;  Surgeon: [name], MD;  Location: [facility] ORS;  Service: General;  Laterality: N/A;  . LEFT HEART CATH AND CORONARY ANGIOGRAPHY Left 04/04/2019   Procedure: LEFT HEART CATH AND CORONARY ANGIOGRAPHY;  Surgeon: [name], MD;  Location: [place] [facility];  Service: Cardiovascular;  Laterality: Left;  [name] MASTECTOMY    . MASTECTOMY W/ SENTINEL NODE BIOPSY Left 08/26/2018   Procedure: PARTIAL MASTECTOMY WIDE EXCISION WITH SENTINEL LYMPH NODE BIOPSY LEFT;  Surgeon: [name], MD;  Location: [facility] ORS;  Service: General;  Laterality: Left;  . POLYPECTOMY  05/24/2015   Procedure: POLYPECTOMY;  Surgeon: [name], MD;  Location: [place];  Service: Endoscopy;;  . THYROIDECTOMY    . TRIGGER FINGER RELEASE       Social History   Tobacco Use  . Smoking status: Former Smoker    Packs/day: 0.50    Years: 15.00    Pack years: 7.50    Types: Cigarettes    Quit date: 03/14/2007    Years since quitting: 12.5  . Smokeless tobacco: Never Used  Vaping Use  . Vaping Use: Never used  Substance Use Topics  . Alcohol use: No    Alcohol/week: 0.0 standard drinks  . Drug use: Never      Family History  Problem Relation Age of [place]  . Hypertension Father   . Heart disease Father   . Heart attack Father   . Diabetes Father   . Cancer Mother        colon  . Heart attack Brother   . Diabetes Brother   . Hypertension Brother   . Heart disease  Brother   . Heart attack Brother   . Diabetes Brother   . Hypertension Brother   . Heart disease Brother   . Heart attack Brother   . Diabetes Brother   . Hypertension Brother   . Heart disease Brother   . Cancer Sister        lung  . COPD Sister   . COPD Sister   . Breast cancer Neg Hx      Allergies  Allergen Reactions  . Hydrocodone Other (See Comments)    Unresponsive  . Aleve [Naproxen Sodium]     Hives & itching   . Contrast Media [Iodinated Diagnostic Agents]     Pt avoids due to kidney issues  . Ioxaglate Other (See Comments)    (iodine) Pt avoids due to kidney issues   . Oxycodone Other (See Comments)    hallucinations  . Ranexa [Ranolazine]     Sick on stomach  . Sulfa Antibiotics Other (See Comments)    Pt avoids due to kidney issues  . Tramadol Other (See Comments)    nightmares    REVIEW OF SYSTEMS(Negative unless checked)  Constitutional: _0 ??Weight loss _1 ??Fever _2 ??Chills Cardiac: _3 ??Chest pain _4 ??Chest pressure _5 ??Palpitations _6 ??Shortness of breath when laying flat _7 ??Shortness of breath at rest _8 ??Shortness of breath with exertion. Vascular: _9 ??Pain in legs with walking _10 ??Pain in legs at rest _11 ??Pain in legs when laying flat _12 ??Claudication _13 ??Pain in feet when walking _14 ??Pain in feet at rest _15 ??Pain in feet when laying flat _16 ??History of DVT _17 ??Phlebitis _18 ??Swelling in legs _19 ??Varicose veins _20 ??Non-healing ulcers Pulmonary: _21 ??Uses home oxygen _22 ??Productive cough _23 ??Hemoptysis _24 ??Wheeze _25 ??COPD _26 ??Asthma Neurologic: _27 ??Dizziness _28 ??Blackouts _29 ??Seizures _30 ??History of stroke _31 ??History of TIA _32 ??Aphasia _33 ??Temporary blindness _34 ??Dysphagia _35 ??Weakness or numbness in arms _36 ??Weakness or numbness in legs Musculoskeletal: _37 ??Arthritis _38 ??Joint swelling _39 ??Joint pain _40 ??Low  back pain Hematologic: _0 ??Easy bruising _1 ??Easy bleeding  _2 ??Hypercoagulable state _3 ??Anemic  Gastrointestinal: _4 ??Blood in stool _5 ??Vomiting blood _6 ??Gastroesophageal reflux/heartburn _7 ??Abdominal pain Genitourinary: _8 ??Chronic kidney disease _9 ??Difficult urination _10 ??Frequent urination _11 ??Burning with urination _12 ??Hematuria Skin: _13 ??Rashes _14 ??Ulcers _15 ??Wounds Psychological: _16 ??History of anxiety _17 ??History of major depression.  Physical Examination  BP 116/77 (BP Location: Right Arm)   Pulse 69   Resp 16  Gen:  WD/WN, NAD Head: [place]/AT, No temporalis wasting. Ear/Nose/Throat: Hearing grossly intact, nares w/o erythema or drainage Eyes: Conjunctiva clear. Sclera non-icteric Neck: Supple.  Trachea midline Pulmonary:  Good air movement, no use of accessory muscles.  Cardiac: RRR, no JVD Vascular:  Vessel Right Left  Radial Palpable Palpable                          PT Palpable Palpable  DP Palpable Palpable    Musculoskeletal: M/S 5/5 throughout.  No deformity or atrophy.  In a wheelchair today.  Mild bilateral lower extremity edema. Neurologic: Sensation grossly intact in extremities.  Symmetrical.  Speech is fluent.  Psychiatric: Judgment intact, Mood & affect appropriate for pt's clinical situation. Dermatologic: No rashes or ulcers noted.  No cellulitis or open wounds.       Labs Recent Results (from the past 2160 hour(s))  Basic metabolic panel     Status: Abnormal   Collection Time: 07/17/19  3:49 PM  Result Value Ref Range   Sodium 138 135 - 145 mmol/L   Potassium 4.5 3.5 - 5.1 mmol/L   Chloride 103 98 - 111 mmol/L   CO2 25 22 - 32 mmol/L   Glucose, Bld 225 (H) 70 - 99 mg/dL    Comment: Glucose reference range applies only to samples taken after fasting for at least 8 hours.   BUN 25 (H) 8 - 23 mg/dL   Creatinine, Ser 1.78 (H) 0.44 - 1.00 mg/dL   Calcium 8.8 (L) 8.9 - 10.3 mg/dL   GFR calc non Af Amer 27 (L) >60 mL/min   GFR calc Af Amer 32 (L) >60 mL/min   Anion gap 10  5 - 15    Comment: Performed at [facility], [address] [place], [place] [address]  CBC     Status: None   Collection Time: 07/17/19  3:49 PM  Result Value Ref Range   WBC 10.2 4.0 - 10.5 K/uL   RBC 4.41 3.87 - 5.11 MIL/uL   Hemoglobin 12.8 12.0 - 15.0 g/dL   HCT 40.1 36 - 46 %   MCV 90.9 80.0 - 100.0 fL   MCH 29.0 26.0 - 34.0 pg   MCHC 31.9 30.0 - 36.0 g/dL   RDW 14.1 11.5 - 15.5 %   Platelets 270 150 - 400 K/uL   nRBC 0.0 0.0 - 0.2 %    Comment: Performed at [facility], [place], [place] [address]  Troponin I (High Sensitivity)     Status: None   Collection Time: 07/17/19  3:49 PM  Result Value Ref Range   Troponin I (High Sensitivity) 6 <18 ng/L    Comment: (NOTE) Elevated high sensitivity troponin I (hsTnI) values and significant  changes across serial measurements may suggest ACS but many other  chronic and acute conditions are known to elevate hsTnI results.  Refer to the "Links" section for chest pain algorithms and additional  guidance. Performed at [facility], [address] [place], [place] [address]   Troponin I (High Sensitivity)  Status: None   Collection Time: 07/17/19  7:10 PM  Result Value Ref Range   Troponin I (High Sensitivity) 5 <18 ng/L    Comment: (NOTE) Elevated high sensitivity troponin I (hsTnI) values and significant  changes across serial measurements may suggest ACS but many other  chronic and acute conditions are known to elevate hsTnI results.  Refer to the "Links" section for chest pain algorithms and additional  guidance. Performed at [facility], [address] [place], [place] [address]   UA/M w/rflx Culture, Routine     Status: Abnormal   Collection Time: 07/25/19  2:37 PM   Specimen: Urine   URINE  Result Value Ref Range   Specific Gravity, UA 1.010 1.005 - 1.030   pH, UA 6.0 5.0 - 7.5   Color, UA Yellow Yellow   Appearance Ur Cloudy (A) Clear   Leukocytes,UA  2+ (A) Negative   Protein,UA 1+ (A) Negative/Trace   Glucose, UA 3+ (A) Negative   Ketones, UA Negative Negative   RBC, UA 2+ (A) Negative   Bilirubin, UA Negative Negative   Urobilinogen, Ur 0.2 0.2 - 1.0 mg/dL   Nitrite, UA Negative Negative   Microscopic Examination See below:    Urinalysis Reflex Comment     Comment: This specimen has reflexed to a Urine Culture.  Microscopic Examination     Status: Abnormal   Collection Time: 07/25/19  2:37 PM   URINE  Result Value Ref Range   WBC, UA >30 (A) 0 - 5 /hpf   RBC 11-30 (A) 0 - 2 /hpf   Epithelial Cells (non renal) 0-10 0 - 10 /hpf   Bacteria, UA Many (A) None seen/Few  Urine Culture, Reflex     Status: Abnormal   Collection Time: 07/25/19  2:37 PM   URINE  Result Value Ref Range   Urine Culture, Routine Final report (A)    Organism ID, Bacteria Klebsiella pneumoniae (A)     Comment: Greater than 100,000 colony forming units per mL Cefazolin <=4 ug/mL Cefazolin with an MIC <=16 predicts susceptibility to the oral agents cefaclor, cefdinir, cefpodoxime, cefprozil, cefuroxime, cephalexin, and loracarbef when used for therapy of uncomplicated urinary tract infections due to E. coli, Klebsiella pneumoniae, and Proteus mirabilis.    Antimicrobial Susceptibility Comment     Comment:       ** S = Susceptible; I = Intermediate; R = Resistant **                    P = Positive; N = Negative             MICS are expressed in micrograms per mL    Antibiotic                 RSLT#1    RSLT#2    RSLT#3    RSLT#4 Amoxicillin/Clavulanic Acid    S Ampicillin                     R Cefepime                       S Ceftriaxone                    S Cefuroxime                     S Ciprofloxacin  S Ertapenem                      S Gentamicin                     S Imipenem                       S Levofloxacin                   S Meropenem                      S Nitrofurantoin                 I Piperacillin/Tazobactam         S Tetracycline                   S Tobramycin                     S Trimethoprim/Sulfa             S   Urinalysis, Complete     Status: Abnormal   Collection Time: 08/01/19  2:10 PM  Result Value Ref Range   Specific Gravity, UA 1.015 1.005 - 1.030   pH, UA 6.5 5.0 - 7.5   Color, UA Yellow Yellow   Appearance Ur Cloudy (A) Clear   Leukocytes,UA Trace (A) Negative   Protein,UA Trace (A) Negative/Trace   Glucose, UA 2+ (A) Negative   Ketones, UA Negative Negative   RBC, UA 2+ (A) Negative   Bilirubin, UA Negative Negative   Urobilinogen, Ur 0.2 0.2 - 1.0 mg/dL   Nitrite, UA Negative Negative   Microscopic Examination See below:   Microscopic Examination     Status: Abnormal   Collection Time: 08/01/19  2:10 PM   URINE  Result Value Ref Range   WBC, UA >30 (A) 0 - 5 /hpf   RBC 3-10 (A) 0 - 2 /hpf   Epithelial Cells (non renal) 0-10 0 - 10 /hpf   Bacteria, UA Few None seen/Few  Bladder Scan (Post Void Residual) in office     Status: None   Collection Time: 08/01/19  2:17 PM  Result Value Ref Range   Scan Result 13   Basic metabolic panel     Status: Abnormal   Collection Time: 08/07/19  5:04 PM  Result Value Ref Range   Glucose 161 (H) 65 - 99 mg/dL   BUN 30 (H) 8 - 27 mg/dL   Creatinine, Ser 1.77 (H) 0.57 - 1.00 mg/dL   GFR calc non Af Amer 28 (L) >59 mL/min/1.73   GFR calc Af Amer 32 (L) >59 mL/min/1.73    Comment: **[facility] currently reports eGFR in compliance with the current**   recommendations of the [employer]. [facility] will   update reporting as new guidelines are published from the NKF-ASN   Task force.    BUN/Creatinine Ratio 17 12 - 28   Sodium 138 134 - 144 mmol/L   Potassium 5.1 3.5 - 5.2 mmol/L   Chloride 100 96 - 106 mmol/L   CO2 22 20 - 29 mmol/L   Calcium 10.2 8.7 - 10.3 mg/dL  Urinalysis, Complete     Status: Abnormal   Collection Time: 08/18/19  2:28 PM  Result Value Ref Range   Specific Gravity, UA 1.015 1.005 - 1.030   pH, UA  5.5  5.0 - 7.5   Color, UA Yellow Yellow   Appearance Ur Cloudy (A) Clear   Leukocytes,UA Trace (A) Negative   Protein,UA 3+ (A) Negative/Trace   Glucose, UA 3+ (A) Negative   Ketones, UA Negative Negative   RBC, UA 3+ (A) Negative   Bilirubin, UA Negative Negative   Urobilinogen, Ur 0.2 0.2 - 1.0 mg/dL   Nitrite, UA Negative Negative   Microscopic Examination See below:   Microscopic Examination     Status: Abnormal   Collection Time: 08/18/19  2:28 PM   URINE  Result Value Ref Range   WBC, UA >30 (A) 0 - 5 /hpf   RBC >30 (A) 0 - 2 /hpf   Epithelial Cells (non renal) 0-10 0 - 10 /hpf   Bacteria, UA Few None seen/Few   Yeast, UA Present (A) None seen  CULTURE, URINE COMPREHENSIVE     Status: None   Collection Time: 08/18/19  3:34 PM   Specimen: Urine   UR  Result Value Ref Range   Urine Culture, Comprehensive Final report    Organism ID, Bacteria Comment     Comment: Mixed urogenital flora 10,000-25,000 colony forming units per mL   BLADDER SCAN AMB NON-IMAGING     Status: None   Collection Time: 08/18/19  3:47 PM  Result Value Ref Range   Scan Result 1   Comprehensive metabolic panel     Status: Abnormal   Collection Time: 09/02/19  3:34 PM  Result Value Ref Range   Glucose 173 (H) 65 - 99 mg/dL   BUN 26 8 - 27 mg/dL   Creatinine, Ser 1.49 (H) 0.57 - 1.00 mg/dL   GFR calc non Af Amer 34 (L) >59 mL/min/1.73   GFR calc Af Amer 39 (L) >59 mL/min/1.73    Comment: **[facility] currently reports eGFR in compliance with the current**   recommendations of the [employer]. [facility] will   update reporting as new guidelines are published from the NKF-ASN   Task force.    BUN/Creatinine Ratio 17 12 - 28   Sodium 140 134 - 144 mmol/L   Potassium 5.1 3.5 - 5.2 mmol/L   Chloride 102 96 - 106 mmol/L   CO2 27 20 - 29 mmol/L   Calcium 9.5 8.7 - 10.3 mg/dL   Total Protein 7.1 6.0 - 8.5 g/dL   Albumin 4.1 3.7 - 4.7 g/dL   Globulin, Total 3.0 1.5 - 4.5 g/dL    Albumin/Globulin Ratio 1.4 1.2 - 2.2   Bilirubin Total 0.5 0.0 - 1.2 mg/dL   Alkaline Phosphatase 88 48 - 121 IU/L   AST 15 0 - 40 IU/L   ALT 13 0 - 32 IU/L  Bayer DCA Hb A1c Waived     Status: Abnormal   Collection Time: 09/25/19  2:18 PM  Result Value Ref Range   HB A1C (BAYER DCA - WAIVED) 8.1 (H) <7.0 %    Comment:                                       Diabetic Adult            <7.0                                       Healthy Adult  4.3 - 5.7                                                           (DCCT/NGSP) American Diabetes Association's Summary of Glycemic Recommendations for Adults with Diabetes: Hemoglobin A1c <7.0%. More stringent glycemic goals (A1c <6.0%) may further reduce complications at the cost of increased risk of hypoglycemia.     Radiology No results found.  Assessment/Plan Hyperlipidemia lipid control important in reducing the progression of atherosclerotic disease. Continue statin therapy   Diabetes mellitus type 2 with complications, uncontrolled blood glucose control important in reducing the progression of atherosclerotic disease. Also, involved in wound healing. On appropriate medications.   PAD (peripheral artery disease) ABIs today were actually up on both sides to 0.95 on the right and 0.88 on the left.  Her toe brachial indices were also good and she had good waveforms distally.  I would suspect given these findings, her leg pain with activity is more related to neuropathic claudication or potentially her arthritic issues.  No role for arterial revascularization at this point.  I will plan to see the patient back in 1 year with noninvasive studies or sooner if problems develop in the interim.    Leotis Pain, MD  10/10/2019 12:28 PM    This note was created with Dragon medical transcription system.  Any errors from dictation are purely unintentional

## 2019-10-10 NOTE — Assessment & Plan Note (Signed)
ABIs today were actually up on both sides to 0.95 on the right and 0.88 on the left.  Her toe brachial indices were also good and she had good waveforms distally.  I would suspect given these findings, her leg pain with activity is more related to neuropathic claudication or potentially her arthritic issues.  No role for arterial revascularization at this point.  I will plan to see the patient back in 1 year with noninvasive studies or sooner if problems develop in the interim.

## 2019-10-10 NOTE — Patient Instructions (Signed)
Peripheral Vascular Disease  Peripheral vascular disease (PVD) is a disease of the blood vessels that are not part of your heart and brain. A simple term for PVD is poor circulation. In most cases, PVD narrows the blood vessels that carry blood from your heart to the rest of your body. This can reduce the supply of blood to your arms, legs, and internal organs, like your stomach or kidneys. However, PVD most often affects a person's lower legs and feet. Without treatment, PVD tends to get worse. PVD can also lead to acute ischemic limb. This is when an arm or leg suddenly cannot get enough blood. This is a medical emergency. Follow these instructions at home: Lifestyle  Do not use any products that contain nicotine or tobacco, such as cigarettes and e-cigarettes. If you need help quitting, ask your doctor.  Lose weight if you are overweight. Or, stay at a healthy weight as told by your doctor.  Eat a diet that is low in fat and cholesterol. If you need help, ask your doctor.  Exercise regularly. Ask your doctor for activities that are right for you. General instructions  Take over-the-counter and prescription medicines only as told by your doctor.  Take good care of your feet: ? Wear comfortable shoes that fit well. ? Check your feet often for any cuts or sores.  Keep all follow-up visits as told by your doctor This is important. Contact a doctor if:  You have cramps in your legs when you walk.  You have leg pain when you are at rest.  You have coldness in a leg or foot.  Your skin changes.  You are unable to get or have an erection (erectile dysfunction).  You have cuts or sores on your feet that do not heal. Get help right away if:  Your arm or leg turns cold, numb, and blue.  Your arms or legs become red, warm, swollen, painful, or numb.  You have chest pain.  You have trouble breathing.  You suddenly have weakness in your face, arm, or leg.  You become very  confused or you cannot speak.  You suddenly have a very bad headache.  You suddenly cannot see. Summary  Peripheral vascular disease (PVD) is a disease of the blood vessels.  A simple term for PVD is poor circulation. Without treatment, PVD tends to get worse.  Treatment may include exercise, low fat and low cholesterol diet, and quitting smoking. This information is not intended to replace advice given to you by your health care provider. Make sure you discuss any questions you have with your health care provider. Document Revised: 02/09/2017 Document Reviewed: 04/06/2016 Elsevier Patient Education  2020 Elsevier Inc.  

## 2019-10-15 ENCOUNTER — Other Ambulatory Visit: Payer: Self-pay

## 2019-10-15 ENCOUNTER — Ambulatory Visit (INDEPENDENT_AMBULATORY_CARE_PROVIDER_SITE_OTHER): Payer: Medicare Other | Admitting: Urology

## 2019-10-15 ENCOUNTER — Encounter: Payer: Self-pay | Admitting: Urology

## 2019-10-15 VITALS — BP 138/66 | HR 72 | Ht 66.0 in | Wt 217.0 lb

## 2019-10-15 DIAGNOSIS — R3129 Other microscopic hematuria: Secondary | ICD-10-CM

## 2019-10-16 ENCOUNTER — Other Ambulatory Visit: Payer: Self-pay | Admitting: Family Medicine

## 2019-10-16 ENCOUNTER — Encounter: Payer: Self-pay | Admitting: Urology

## 2019-10-16 NOTE — Progress Notes (Signed)
   10/16/19  CC:  Chief Complaint  Patient presents with  . Cysto    HPI: Followed for recurrent UTIs and recent PA visit found to have microhematuria.  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  She complains of terminal dysuria.  Has a contrast allergy and is scheduled for MR urogram.  Blood pressure 138/66, pulse 72, height 5\' 6"  (1.676 m), weight 217 lb (98.4 kg). NED. A&Ox3.   No respiratory distress   Abd soft, NT, ND Normal external genitalia with patent urethral meatus  Cystoscopy Procedure Note  Patient identification was confirmed, informed consent was obtained, and patient was prepped using Betadine solution.  Lidocaine jelly was administered per urethral meatus.    Procedure: - Flexible cystoscope introduced, without any difficulty.   - Thorough search of the bladder revealed:    normal urethral meatus    normal urothelium    no stones    no ulcers     no tumors    no urethral polyps    no trabeculation  - Ureteral orifices were normal in position and appearance.  Post-Procedure: - Patient tolerated the procedure well  Assessment/ Plan:  No significant mucosal abnormalities on cystoscopy  Her microhematuria has been noted in conjunction to her pyuria and most likely due to her recurrent UTIs  She is scheduled for MR urogram in approximately 2 weeks and will call with results  Given Uribel samples to see if this improves her terminal dysuria  Also recommended the addition of D-mannose for her recurrent UTIs.    [name], MD

## 2019-10-17 LAB — URINALYSIS, COMPLETE
Bilirubin, UA: NEGATIVE
Glucose, UA: NEGATIVE
Ketones, UA: NEGATIVE
Nitrite, UA: POSITIVE — AB
Specific Gravity, UA: >1.03 — ABNORMAL HIGH (ref 1.005–1.030)
Urobilinogen, Ur: 0.2 mg/dL (ref 0.2–1.0)
pH, UA: 5.5 (ref 5.0–7.5)

## 2019-10-17 LAB — MICROSCOPIC EXAMINATION: WBC, UA: >30 /hpf — AB (ref 0–5)

## 2019-10-20 ENCOUNTER — Telehealth: Payer: Self-pay | Admitting: Family Medicine

## 2019-10-20 NOTE — Telephone Encounter (Signed)
On chart review, yes, the 09/26/2019 lab order would this be ok to schedule pt to recheck the A1c

## 2019-10-20 NOTE — Telephone Encounter (Signed)
Copied from [place] [id number]. Topic: General - Other >> Oct 20, 2019  2:07 PM Oneta Rack wrote: Patient states PCP advised to recheck A1C, chart does not reflect lab orders, please advise

## 2019-10-20 NOTE — Telephone Encounter (Signed)
I see a order from 7/16/2021would this be ok to schedule as just labs or is she also due for f.u?

## 2019-10-20 NOTE — Telephone Encounter (Signed)
Patient scheduled for 10/21/2019 for labs,verbalized understanding.

## 2019-10-21 ENCOUNTER — Other Ambulatory Visit: Payer: Medicare Other

## 2019-10-21 ENCOUNTER — Other Ambulatory Visit: Payer: Self-pay

## 2019-10-21 DIAGNOSIS — E118 Type 2 diabetes mellitus with unspecified complications: Secondary | ICD-10-CM | POA: Diagnosis not present

## 2019-10-21 DIAGNOSIS — E1165 Type 2 diabetes mellitus with hyperglycemia: Secondary | ICD-10-CM

## 2019-10-21 DIAGNOSIS — IMO0002 Reserved for concepts with insufficient information to code with codable children: Secondary | ICD-10-CM

## 2019-10-21 LAB — BAYER DCA HB A1C WAIVED: HB A1C (BAYER DCA - WAIVED): 8.4 % — ABNORMAL HIGH (ref <7.0)

## 2019-10-22 ENCOUNTER — Ambulatory Visit: Payer: Medicare Other | Admitting: Pharmacist

## 2019-10-22 DIAGNOSIS — I129 Hypertensive chronic kidney disease with stage 1 through stage 4 chronic kidney disease, or unspecified chronic kidney disease: Secondary | ICD-10-CM

## 2019-10-22 DIAGNOSIS — IMO0002 Reserved for concepts with insufficient information to code with codable children: Secondary | ICD-10-CM

## 2019-10-22 DIAGNOSIS — E1165 Type 2 diabetes mellitus with hyperglycemia: Secondary | ICD-10-CM

## 2019-10-22 NOTE — Chronic Care Management (AMB) (Signed)
Chronic Care Management   Follow Up Note   10/22/2019 Name: Tammy [name] MRN: [id number] DOB: 21-May-1945  Referred by: [name], DO Reason for referral : Chronic Care Management DM, HTN  Tammy [name] is a 74 y.o. year old female who is a primary care patient of [name], DO. The CCM team was consulted for assistance with chronic disease management and care coordination needs.    Review of patient status, including review of consultants reports, relevant laboratory and other test results, and collaboration with appropriate care team members and the patient's provider was performed as part of comprehensive patient evaluation and provision of chronic care management services.    SDOH (Social Determinants of Health) assessments performed: No See Care Plan activities for detailed interventions related to [facility])     Outpatient Encounter Medications as of 10/22/2019  Medication Sig Note  . anastrozole (ARIMIDEX) 1 MG tablet Take 1 tablet (1 mg total) by mouth daily.   . blood glucose meter kit and supplies KIT Dispense based on patient and insurance preference. Use one time daily as directed, E11.9   . Calcium Carb-Cholecalciferol (CALCIUM 600 + D) 600-200 MG-UNIT TABS Take 1 tablet by mouth daily.    . cetirizine (ZYRTEC) 10 MG tablet Take 1 tablet (10 mg total) by mouth daily.   . Dulaglutide (TRULICITY) 3 HF/2.9MS SOPN Inject 3 mg into the skin once a week.   . DULoxetine (CYMBALTA) 60 MG capsule TAKE 1 CAPSULE BY MOUTH ONCE DAILY   . ELIQUIS 2.5 MG TABS tablet TAKE 1 TABLET BY MOUTH TWICE DAILY   . gabapentin (NEURONTIN) 300 MG capsule Take 1 capsule (300 mg total) by mouth 3 (three) times daily.   [name] glipiZIDE (GLUCOTROL) 5 MG tablet TAKE ONE TABLET BY MOUTH EVERY DAY BEFORE BREAKFAST   . insulin degludec (TRESIBA FLEXTOUCH) 100 UNIT/ML FlexTouch Pen Inject 0.24 mLs (24 Units total) into the skin daily.   . Insulin Pen Needle (PEN NEEDLES 3/16") 31G X 5 MM MISC 1 each by Does  not apply route daily.   . isosorbide mononitrate (IMDUR) 60 MG 24 hr tablet TAKE ONE TABLET BY MOUTH AT BEDTIME AND HALF A TABLET EVERY MORNING   . levothyroxine (SYNTHROID) 150 MCG tablet Take 1 tablet (150 mcg total) by mouth daily before breakfast.   . losartan (COZAAR) 25 MG tablet Take 0.5 tablets (12.5 mg total) by mouth daily. 08/20/2019: Taking 25 mg daily   . metoprolol succinate (TOPROL-XL) 50 MG 24 hr tablet TAKE ONE TABLET EVERY DAY WITH OR IMMEDIATELY FOLLOWING A MEAL   . mirabegron ER (MYRBETRIQ) 50 MG TB24 tablet Take 1 tablet (50 mg total) by mouth daily.   . montelukast (SINGULAIR) 10 MG tablet TAKE ONE TABLET BY MOUTH AT BEDTIME   . Multiple Vitamins-Minerals (ZINC PO) Take 50 mg by mouth daily.    . nitroGLYCERIN (NITROSTAT) 0.4 MG SL tablet Place 1 tablet (0.4 mg total) under the tongue every 5 (five) minutes as needed for chest pain.   [name] olopatadine (PATADAY) 0.1 % ophthalmic solution Place 1 drop into both eyes 2 (two) times daily.   . pantoprazole (PROTONIX) 40 MG tablet Take 1 tablet (40 mg total) by mouth daily as needed.   . Polyethylene Glycol 400 (BLINK TEARS) 0.25 % SOLN Place 1 drop into both eyes 3 (three) times daily as needed (dry/irritated eyes.).   [name] PROAIR HFA 108 (90 Base) MCG/ACT inhaler TAKE 2 PUFFS EVERY 4 HOURS AS NEEDED FORWHEEZING OR  SHORTNESS OF BREATH 08/05/2019: Using ~once per week  . rosuvastatin (CRESTOR) 40 MG tablet TAKE 1 TABLET BY MOUTH DAILY   . trimethoprim (TRIMPEX) 100 MG tablet Take 100 mg by mouth daily.   . cholecalciferol (VITAMIN D3) 25 MCG (1000 UT) tablet Take 1,000 Units by mouth daily.   . polyethylene glycol (MIRALAX MIX-IN PAX) 17 g packet Take 17 g by mouth daily.    No facility-administered encounter medications on file as of 10/22/2019.     Objective:   Goals Addressed              This Visit's Progress   .  "I want to keep feeling good" (pt-stated)        CARE PLAN ENTRY (see longtitudinal plan of care for additional  care plan information)  Current Barriers:  . Diabetes: uncontrolled; complicated by chronic medical conditions including breast cancer, most recent A1c 8.4% . Most recent eGFR: ~34 mL/min . She is disappointed that her A1c was not below 8 as required by Ortho to have her knee replaced. This has greatly impacted her quality of life as she can barely walk and is no longer to attend church, drive a vehicle, travel or help her husband around the house. She feels like she can't control her diabetes and this is frustrating.She has a good family support system and is grateful for that. We talked about diet extensively.Praised patient for dietary efforts and great improvement in A1c from 9.8 in April.  . Current antihyperglycemic regimen: Trulicity 3 mg weekly, glipizide 5 mg QAM, Tresiba 24 units daily . Denies hypoglycemic symptoms, including dizziness, lightheadedness, shaking, sweating . Current glucose readings:  o Fastings in past few days 120-130s, excursions never above 170. . Cardiovascular risk reduction: o Current hypertensive regimen: metoprolol succinate 50 mg daily, losartan 25 mg daily - had been decreased by cardiology in May to 12.5 mg daily d/t presenting w/ hypotension to the appointment, but patient notes that she generally avoids drinking water before appointments to avoid urinary accidents. She self-increased back to losartan 25 mg daily and reports home readings (and documented clinic readings) remain at goal <130/90 and no SBP <100 o Current hyperlipidemia regimen: rosuvastatin 40 mg daily  o Current antiplatelet regimen: Eliquis 2.5 mg BID (DVT ppx reduced dose) . Stage I breast cancer ER/PR positive HER-2 negative; follows w/ cancer center. On Arimadex 1 mg daily  . Overactive bladder: Myrbetriq 50 mg daily - trimethoprim 100 mg daily - reports urology gave her samples of Uribel but she stopped taking due to constipation. Has not started D-Mannose as recommended because she could not  read the what the physician wrote and tried to explain to pharmacist but ultimately gave up. Reports she no longer having urinary symptoms and doesn't need to get up overnight to urinate. Pharmacist Clinical Goal(s):  [name] Over the next 90 days, patient will work with PharmD and primary care provider to address optimized medication management  Interventions: . Comprehensive medication review performed, medication list updated in electronic medical record . Inter-disciplinary care team collaboration (see longitudinal plan of care) . Encouraged continued adherence to [place] 24 units daily and including post-prandial and bedtime glucometers to help identify areas of improvement. . Encouraged her to consider physical therapy/water therapy for her knee. She had a bad experience with PT and is not interested in pursuing at this time. [name] Extensively reviewed diet with patient. Encouraged her incorporate protein with each meal, reduce servings and portion size of fruit and eat  dinner earlier as schedule allows.  . Encouraged to contact Orthopedic office and relay her A1c progress from 9.8 to 8.4. [name] Collaborate with RN CM to provide dietary, lifestyle support.    Patient Self Care Activities:  . Patient will check blood glucose every morning and 2 hours after a meal and at bedtime. Record readings and bring to appointments. . Patient will take medications as prescribed . Patient will report any questions or concerns to provider  . Patient will incorporate protein into meals. Limit daily servings of fruit.  Please see past updates related to this goal by clicking on the "Past Updates" button in the selected goal          Plan:   Telephone follow up appointment scheduled for: 2 months   Junita Push. Kenton Kingfisher PharmD, [facility] [phone]

## 2019-10-22 NOTE — Patient Instructions (Addendum)
Visit Information  It was a pleasure speaking with you today. Thank you for letting me be part of your clinical team. Please call with any questions or concerns.   Goals Addressed              This Visit's Progress   .  "I want to keep feeling good" (pt-stated)        CARE PLAN ENTRY (see longtitudinal plan of care for additional care plan information)  Current Barriers:  . Diabetes: uncontrolled; complicated by chronic medical conditions including breast cancer, most recent A1c 8.4% . Most recent eGFR: ~34 mL/min . She is disappointed that her A1c was not below 8 as required by Ortho to have her knee replaced. This has greatly impacted her quality of life as she can barely walk and is no longer to attend church, drive a vehicle, travel or help her husband around the house. She feels like she can't control her diabetes and this is frustrating.She has a good family support system and is grateful for that. We talked about diet extensively.Praised patient for dietary efforts and great improvement in A1c from 9.8 in April.  . Current antihyperglycemic regimen: Trulicity 3 mg weekly, glipizide 5 mg QAM, Tresiba 24 units daily . Denies hypoglycemic symptoms, including dizziness, lightheadedness, shaking, sweating . Current glucose readings:  o [place] in past few days 120-130s, excursions never above 170. . Cardiovascular risk reduction: o Current hypertensive regimen: metoprolol succinate 50 mg daily, losartan 25 mg daily - had been decreased by cardiology in May to 12.5 mg daily d/t presenting w/ hypotension to the appointment, but patient notes that she generally avoids drinking water before appointments to avoid urinary accidents. She self-increased back to losartan 25 mg daily and reports home readings (and documented clinic readings) remain at goal <130/90 and no SBP <100 o Current hyperlipidemia regimen: rosuvastatin 40 mg daily  o Current antiplatelet regimen: Eliquis 2.5 mg BID (DVT ppx  reduced dose) . Stage I breast cancer ER/PR positive HER-2 negative; follows w/ cancer center. On Arimadex 1 mg daily  . Overactive bladder: Myrbetriq 50 mg daily - trimethoprim 100 mg daily - reports urology gave her samples of Uribel but she stopped taking due to constipation. Has not started D-Mannose as recommended because she could not read the what the physician wrote and tried to explain to pharmacist but ultimately gave up. Reports she no longer having urinary symptoms and doesn't need to get up overnight to urinate. Pharmacist Clinical Goal(s):  . Over the next 90 days, patient will work with PharmD and primary care provider to address optimized medication management  Interventions: . Comprehensive medication review performed, medication list updated in electronic medical record . Inter-disciplinary care team collaboration (see longitudinal plan of care) . Encouraged continued adherence to Tresiba 24 units daily and including post-prandial and bedtime glucometers to help identify areas of improvement. . Encouraged her to consider physical therapy/water therapy for her knee. She had a bad experience with PT and is not interested in pursuing at this time. . Extensively reviewed diet with patient. Encouraged her incorporate protein with each meal, reduce servings and portion size of fruit and eat dinner earlier as schedule allows.  . Encouraged to contact Orthopedic office and relay her A1c progress from 9.8 to 8.4. . Collaborate with RN CM to provide dietary, lifestyle support.    Patient Self Care Activities:  . Patient will check blood glucose every morning and 2 hours after a meal and at bedtime. Record readings   and bring to appointments. . Patient will take medications as prescribed . Patient will report any questions or concerns to provider  . Patient will incorporate protein into meals. Limit daily servings of fruit.  Please see past updates related to this goal by clicking on the  "Past Updates" button in the selected goal         The patient verbalized understanding of instructions provided today and agreed to receive a mailed copy of patient instruction and/or educational materials.  Telephone follow up appointment with pharmacy team member scheduled for: 2 months  Julie S. Harris PharmD, BCPS Clinical Pharmacist [phone]  Living With Diabetes Diabetes (type 1 diabetes mellitus or type 2 diabetes mellitus) is a condition in which the body does not have enough of a hormone called insulin, or the body does not respond properly to insulin. Normally, insulin allows sugars (glucose) to enter cells in the body. The cells use glucose for energy. With diabetes, extra glucose builds up in the blood instead of going into cells, which results in high blood glucose (hyperglycemia). How to manage lifestyle changes Managing diabetes includes medical treatments as well as lifestyle changes. If diabetes is not managed well, serious physical and emotional complications can occur. Taking good care of yourself means that you are responsible for:  Monitoring glucose regularly.  Eating a healthy diet.  Exercising regularly.  Meeting with health care providers.  Taking medicines as directed. Some people may feel a lot of stress about managing their diabetes. This is known as emotional distress, and it is very common. Living with diabetes can place you at risk for emotional distress, depression, or anxiety. These disorders can be confusing and can make diabetes management more difficult. How to recognize stress Emotional distress Symptoms of emotional distress include:  Anger about having a diagnosis of diabetes.  Fear or frustration about your diagnosis and the changes you need to make to manage the condition.  Being overly worried about the care that you need or the cost of the care that you need.  Feeling like you caused your condition by doing something wrong.   Fear of unpredictable situations, like low or high blood glucose.  Feeling judged by your health care providers.  Feeling very alone with the disease.  Getting too tired or worn out with the demands of daily care. Depression Having diabetes means that you are at a higher risk for depression. Having depression also means that you are at a higher risk for diabetes. Your health care provider may test (screen) you for symptoms of depression. It is important to recognize depression symptoms and to start treatment for depression soon after it is diagnosed. The following are some symptoms of depression:  Loss of interest in things that you used to enjoy.  Trouble sleeping, or often waking up early and not being able to get back to sleep.  A change in appetite.  Feeling tired most of the day.  Feeling nervous and anxious.  Feeling guilty and worrying that you are a burden to others.  Feeling depressed more often than you do not feel that way.  Thoughts of hurting yourself or feeling that you want to die. If you have any of these symptoms for 2 weeks or longer, reach out to a health care provider. Follow these instructions at home: Managing emotional distress The following are some ways to manage emotional distress:  Talk with your health care provider or certified diabetes educator. Consider working with a counselor or therapist.    Learn as much as you can about diabetes and its treatment. Meet with a certified diabetes educator or take a class to learn how to manage your condition.  Keep a journal of your thoughts and concerns.  Accept that some things are out of your control.  Talk with other people who have diabetes. It can help to talk with others about the emotional distress that you feel.  Find ways to manage stress that work for you. These may include art or music therapy, exercise, meditation, and hobbies.  Seek support from spiritual leaders, family, and friends. General  instructions  Follow your diabetes management plan.  Keep all follow-up visits as told by your health care provider. This is important. Where to find support   Ask your health care provider to recommend a therapist who understands both depression and diabetes.  Search for information and support from the American Diabetes Association: www.diabetes.org  Find a certified diabetes educator and make an appointment through [place] [employer] Diabetes Educators: www.diabeteseducator.org Get help right away if:  You have thoughts about hurting yourself or others. If you ever feel like you may hurt yourself or others, or have thoughts about taking your own life, get help right away. You can go to your nearest emergency department or call:  Your local emergency services (911 in the [place]  A suicide crisis helpline, such as the [place] at (670)031-6884. This is open 24 hours a day. Summary  Diabetes (type 1 diabetes mellitus or type 2 diabetes mellitus) is a condition in which the body does not have enough of a hormone called insulin, or the body does not respond properly to insulin.  Living with diabetes puts you at risk for medical issues, and it also puts you at risk for emotional issues such as emotional distress, depression, and anxiety.  Recognizing the symptoms of emotional distress and depression may help you avoid problems with your diabetes control. It is important to start treatment for emotional distress and depression soon after they are diagnosed.  Having diabetes means that you are at a higher risk for depression. Ask your health care provider to recommend a therapist who understands both depression and diabetes.  If you experience symptoms of emotional distress or depression, it is important to discuss this with your health care provider, certified diabetes educator, or therapist. This information is not intended to replace advice given to you  by your health care provider. Make sure you discuss any questions you have with your health care provider. Document Revised: 03/11/2018 Document Reviewed: 07/13/2016 Elsevier Patient Education  [place].

## 2019-10-23 ENCOUNTER — Other Ambulatory Visit: Payer: Self-pay | Admitting: Physician Assistant

## 2019-10-23 DIAGNOSIS — N3281 Overactive bladder: Secondary | ICD-10-CM

## 2019-10-29 ENCOUNTER — Ambulatory Visit
Admission: RE | Admit: 2019-10-29 | Discharge: 2019-10-29 | Disposition: A | Discharge status: Home / Self Care | Payer: Medicare Other | Source: Ambulatory Visit | Attending: Physician Assistant | Admitting: Physician Assistant

## 2019-10-29 ENCOUNTER — Other Ambulatory Visit: Payer: Self-pay

## 2019-10-29 DIAGNOSIS — R3129 Other microscopic hematuria: Secondary | ICD-10-CM | POA: Insufficient documentation

## 2019-10-29 DIAGNOSIS — N261 Atrophy of kidney (terminal): Secondary | ICD-10-CM | POA: Diagnosis not present

## 2019-10-29 DIAGNOSIS — N2889 Other specified disorders of kidney and ureter: Secondary | ICD-10-CM | POA: Diagnosis not present

## 2019-10-29 DIAGNOSIS — K5730 Diverticulosis of large intestine without perforation or abscess without bleeding: Secondary | ICD-10-CM | POA: Diagnosis not present

## 2019-10-29 DIAGNOSIS — N1330 Unspecified hydronephrosis: Secondary | ICD-10-CM | POA: Diagnosis not present

## 2019-11-04 ENCOUNTER — Telehealth: Payer: Self-pay | Admitting: Cardiovascular Disease

## 2019-11-04 NOTE — Telephone Encounter (Signed)
Please, on anticoagulation and then I will contact the patient for preop clearance.  Kerin Ransom PA-C 11/04/2019 4:22 PM

## 2019-11-04 NOTE — Telephone Encounter (Signed)
   [facility] Pre-operative Risk Assessment    HEARTCARE STAFF: - Please ensure there is not already an duplicate clearance open for this procedure. - Under Visit Info/Reason for Call, type in Other and utilize the format Clearance MM/DD/YY or Clearance TBD. Do not use dashes or single digits. - If request is for dental extraction, please clarify the # of teeth to be extracted.  Request for surgical clearance:  1. What type of surgery is being performed? Right total knee anthroplasty  2. When is this surgery scheduled? 11/13/19  3. What type of clearance is required (medical clearance vs. Pharmacy clearance to hold med vs. Both)? both  4. Are there any medications that need to be held prior to surgery and how long? Eliquis - hold 3 days prior to surgery, when to resume  5. Practice name and name of physician performing surgery? [facility] - [place] - Dr Kurtis Bushman  6. What is the office phone number? [phone] ext [phone]   7.   What is the office fax number? [place]   Anesthesia type (None, local, MAC, general) ? Spinal    Tammy [name] 11/04/2019, 3:36 PM  _________________________________________________________________   (provider comments below)

## 2019-11-04 NOTE — Telephone Encounter (Signed)
Patient with diagnosis of PE in 2015 and DVT on Eliquis for anticoagulation.    Procedure: TKA Date of procedure: 11/13/19  CrCl 83mL/min Platelet count 270K  Typically hold Eliquis for 3 days prior to TKA. Due to history of recurrent VTE (PE occurred in 2015, cannot tell when DVT occurred), will defer to MD regarding length of anticoag hold.

## 2019-11-05 NOTE — Telephone Encounter (Signed)
Okay to hold Eliquis for 3 days prior to procedure Would recommend use of bilateral compression hose from morning till before bed on any days when she is not on Eliquis Restart Eliquis when approved by surgeon following procedure Compression hose following procedure if tolerated on days when she is not on Eliquis

## 2019-11-05 NOTE — Telephone Encounter (Signed)
Left message to call back  Kerin Ransom PA-C 11/05/2019 3:42 PM

## 2019-11-06 NOTE — Telephone Encounter (Signed)
Patient is returning call from preop pool. Please call back.

## 2019-11-06 NOTE — Telephone Encounter (Signed)
   Primary Cardiologist: Ida Rogue, MD  Chart reviewed as part of pre-operative protocol coverage. Patient was contacted 11/06/2019 in reference to pre-operative risk assessment for pending surgery as outlined below.  Tammy [name] was last seen on 09/08/19 by Dr. [name].  Since that day, Tammy [name] has done well.    She can't complete 4.0 METS due to severe knee pain. She denies anginal symptoms. Heart catheterization 03/2019 showed stable disease with known occlusion of the RCA and collaterals. She understands that she is at higher risk for cardiac complications during the perioperative period (6.6%) due to her underlying CAD and wishes to proceed.   Per Dr. [name]: She may hold eliquis for 3 days prior to procedure. Given her history of PE/DVT, he has recommended placement of compression stockings while off of anticoagulation.   Therefore, based on ACC/AHA guidelines, the patient would be at acceptable risk for the planned procedure without further cardiovascular testing.   I will route this recommendation to the requesting party via Epic fax function and remove from pre-op pool. Please call with questions.  [place], PA 11/06/2019, 11:03 AM

## 2019-11-07 DIAGNOSIS — M1711 Unilateral primary osteoarthritis, right knee: Secondary | ICD-10-CM | POA: Diagnosis not present

## 2019-11-10 ENCOUNTER — Telehealth: Payer: Self-pay | Admitting: Radiology

## 2019-11-10 NOTE — Telephone Encounter (Signed)
Patient states Dr Bernardo Heater gave her samples of Uribel which has relieved her symptoms. She would like a script sent to [facility].

## 2019-11-11 ENCOUNTER — Ambulatory Visit (INDEPENDENT_AMBULATORY_CARE_PROVIDER_SITE_OTHER): Payer: Medicare Other | Admitting: Family Medicine

## 2019-11-11 ENCOUNTER — Telehealth: Payer: Self-pay

## 2019-11-11 ENCOUNTER — Encounter: Payer: Self-pay | Admitting: Family Medicine

## 2019-11-11 ENCOUNTER — Other Ambulatory Visit: Payer: Self-pay

## 2019-11-11 VITALS — BP 107/74 | HR 70 | Temp 97.9°F | Wt 214.0 lb

## 2019-11-11 DIAGNOSIS — N183 Chronic kidney disease, stage 3 unspecified: Secondary | ICD-10-CM

## 2019-11-11 DIAGNOSIS — I200 Unstable angina: Secondary | ICD-10-CM

## 2019-11-11 DIAGNOSIS — E039 Hypothyroidism, unspecified: Secondary | ICD-10-CM | POA: Diagnosis not present

## 2019-11-11 DIAGNOSIS — Z01818 Encounter for other preprocedural examination: Secondary | ICD-10-CM

## 2019-11-11 DIAGNOSIS — Z794 Long term (current) use of insulin: Secondary | ICD-10-CM

## 2019-11-11 DIAGNOSIS — E1165 Type 2 diabetes mellitus with hyperglycemia: Secondary | ICD-10-CM | POA: Diagnosis not present

## 2019-11-11 DIAGNOSIS — I129 Hypertensive chronic kidney disease with stage 1 through stage 4 chronic kidney disease, or unspecified chronic kidney disease: Secondary | ICD-10-CM | POA: Diagnosis not present

## 2019-11-11 DIAGNOSIS — E1122 Type 2 diabetes mellitus with diabetic chronic kidney disease: Secondary | ICD-10-CM

## 2019-11-11 DIAGNOSIS — Z0000 Encounter for general adult medical examination without abnormal findings: Secondary | ICD-10-CM

## 2019-11-11 DIAGNOSIS — E782 Mixed hyperlipidemia: Secondary | ICD-10-CM

## 2019-11-11 LAB — BAYER DCA HB A1C WAIVED: HB A1C (BAYER DCA - WAIVED): 7.7 % — ABNORMAL HIGH (ref <7.0)

## 2019-11-11 MED ORDER — GLIPIZIDE 5 MG PO TABS: ORAL_TABLET | ORAL | 1 refills | Status: DC

## 2019-11-11 MED ORDER — TRULICITY 3 MG/0.5ML ~~LOC~~ SOAJ
3.0000 mg | SUBCUTANEOUS | 6 refills | Status: DC
Start: 2019-11-11 — End: 2020-05-14

## 2019-11-11 MED ORDER — PROAIR HFA 108 (90 BASE) MCG/ACT IN AERS: INHALATION_SPRAY | RESPIRATORY_TRACT | 3 refills | Status: DC

## 2019-11-11 MED ORDER — URIBEL 118 MG PO CAPS: 1.0000 | ORAL_CAPSULE | Freq: Three times a day (TID) | ORAL | 3 refills | Status: DC | PRN

## 2019-11-11 MED ORDER — TRESIBA FLEXTOUCH 100 UNIT/ML ~~LOC~~ SOPN
24.0000 [IU] | PEN_INJECTOR | Freq: Every day | SUBCUTANEOUS | 1 refills | Status: DC
Start: 2019-11-11 — End: 2020-05-14

## 2019-11-11 MED ORDER — GABAPENTIN 300 MG PO CAPS
300.0000 mg | ORAL_CAPSULE | Freq: Three times a day (TID) | ORAL | 3 refills | Status: DC
Start: 2019-11-11 — End: 2020-08-19

## 2019-11-11 MED ORDER — ROSUVASTATIN CALCIUM 40 MG PO TABS
40.0000 mg | ORAL_TABLET | Freq: Every day | ORAL | 1 refills | Status: DC
Start: 2019-11-11 — End: 2020-03-01

## 2019-11-11 MED ORDER — MONTELUKAST SODIUM 10 MG PO TABS
10.0000 mg | ORAL_TABLET | Freq: Every day | ORAL | 1 refills | Status: DC
Start: 2019-11-11 — End: 2020-05-14

## 2019-11-11 MED ORDER — PANTOPRAZOLE SODIUM 40 MG PO TBEC
40.0000 mg | DELAYED_RELEASE_TABLET | Freq: Every day | ORAL | 1 refills | Status: DC | PRN
Start: 2019-11-11 — End: 2020-05-14

## 2019-11-11 MED ORDER — DULOXETINE HCL 60 MG PO CPEP
60.0000 mg | ORAL_CAPSULE | Freq: Every day | ORAL | 1 refills | Status: DC
Start: 2019-11-11 — End: 2020-03-01

## 2019-11-11 NOTE — Progress Notes (Signed)
BP 107/74   Pulse 70   Temp 97.9 F (36.6 C) (Oral)   Wt 214 lb (97.1 kg)   SpO2 94%   BMI 34.54 kg/m    Subjective:    Patient ID: Tammy [name], female    DOB: 1946/01/01, 74 y.o.   MRN: [id number]  HPI: Tammy [name] is a 74 y.o. female presenting on 11/11/2019 for comprehensive medical examination. Current medical complaints include:  DIABETES Hypoglycemic episodes:no Polydipsia/polyuria: no Visual disturbance: no Chest pain: no Paresthesias: no Glucose Monitoring: yes  Accucheck frequency: Daily  Fasting glucose: 110-130s Taking Insulin?: yes Blood Pressure Monitoring: not checking Retinal Examination: Up to Date Foot Exam: Up to Date Diabetic Education: Completed Pneumovax: Up to Date Influenza: not in stock Aspirin: yes  HYPERTENSION / HYPERLIPIDEMIA Satisfied with current treatment? yes Duration of hypertension: chronic BP monitoring frequency: not checking BP medication side effects: no Duration of hyperlipidemia: chronic Cholesterol medication side effects: no Cholesterol supplements: none Past cholesterol medications: crestor Medication compliance: excellent compliance Aspirin: yes Recent stressors: no Recurrent headaches: no Visual changes: no Palpitations: no Dyspnea: no Chest pain: no Lower extremity edema: no Dizzy/lightheaded: no  HYPOTHYROIDISM Thyroid control status:controlled Satisfied with current treatment? yes Medication side effects: no Medication compliance: excellent compliance Recent dose adjustment:no Fatigue: yes Cold intolerance: no Heat intolerance: no Weight gain: no Weight loss: no Constipation: no Diarrhea/loose stools: no Palpitations: no Lower extremity edema: no Anxiety/depressed mood: no  She currently lives with: husband Menopausal Symptoms: no  Functional Status Survey: Is the patient deaf or have difficulty hearing?: No Does the patient have difficulty seeing, even when wearing glasses/contacts?: No Does  the patient have difficulty concentrating, remembering, or making decisions?: No Does the patient have difficulty walking or climbing stairs?: Yes Does the patient have difficulty dressing or bathing?: No Does the patient have difficulty doing errands alone such as visiting a doctor's office or shopping?: Yes  Fall Risk  11/11/2019 11/06/2018 08/29/2018 08/15/2018 08/13/2018  Falls in the past year? 1 1 0 0 0  Number falls in past yr: 0 0 - - -  Injury with Fall? 0 0 - - -  Risk Factor Category  - - - - -  Risk for fall due to : Impaired balance/gait;Impaired mobility History of fall(s);Impaired balance/gait - - -  Follow up - Education provided - Falls evaluation completed Falls evaluation completed    Depression Screen Depression screen [facility] 2/9 11/11/2019 11/06/2018  Decreased Interest 0 0  Down, Depressed, Hopeless 0 0  PHQ - 2 Score 0 0  Altered sleeping 0 -  Tired, decreased energy 0 -  Change in appetite 0 -  Feeling bad or failure about yourself  0 -  Trouble concentrating 0 -  Moving slowly or fidgety/restless 0 -  Suicidal thoughts 0 -  PHQ-9 Score 0 -  Difficult doing work/chores Not difficult at all -  Some recent data might be hidden   Advanced Directives Does patient have a HCPOA?    no Does patient have a living will or MOST form?  no  Past Medical History:  Past Medical History:  Diagnosis Date  . Asthma   . Benign neoplasm of colon   . Breast cancer ([place]) 08/2018   left breast  . Cervical disc disease    "bulging" - no limitations per pt  . Chronic diastolic CHF (congestive heart failure) ([place])    a. 07/2012 Echo: Nl EF. mild inferoseptal HK, Gr 2 DD, mild conc LVH, mild  PR/TR, mild PAH.  . CKD (chronic kidney disease), stage III   . COPD (chronic obstructive pulmonary disease) ([place])   . Coronary artery disease    a. 11/2011 Cath: LAD 59md, LCX min irregs, RCA 70p, 1038mith L->R collats, EF 60%-->Med Rx.  . Diabetes mellitus without complication ([place]  .  Fusion of lumbar spine 12/22/2015  . GERD (gastroesophageal reflux disease)   . History of DVT (deep vein thrombosis)   . History of tobacco abuse    a. Quit 2011.  . Marland Kitchenyperlipidemia   . Hypertension   . Hypothyroidism   . Obesity   . Palpitations   . PE (pulmonary embolism) 10/15  . Personal history of radiation therapy   . PONV (postoperative nausea and vomiting)   . [place]   R[place]. Presented with SOB and elevated d-dimer.   . Marland KitchenVD (peripheral vascular disease) ([place]   a. PTA of right leg and left femoral artery with stenosis.  . Renal insufficiency   . [facility]   TIAs - no deficits    Surgical History:  Past Surgical History:  Procedure Laterality Date  . ANGIOPLASTY / STENTING FEMORAL     PVD; angioplasty right leg and left femoral artery with stenosis.   . APPENDECTOMY  Oct. 2016  . BACK SURGERY  03/2012   nerve stimulator inserted  . BACK SURGERY     screws, rods replaced with new hardware.  . Marland KitchenACK SURGERY  11/2016  . BREAST LUMPECTOMY Left 08/26/2018  . BREAST SURGERY    . CARDIAC CATHETERIZATION  12/07/2011   Mid LAD 50%, distal LAD 50%, mid RCA 70%, distal RCA 100%   . CARDIAC CATHETERIZATION  01/04/2011   100% occluded mid RCA with good collaterals from distal LAD, normal LVEF.  . Marland KitchenHOLECYSTECTOMY    . COLONOSCOPY  2013  . COLONOSCOPY WITH PROPOFOL N/A 05/24/2015   Procedure: COLONOSCOPY WITH PROPOFOL;  Surgeon: DaLucilla LameMD;  [place]: Endoscopy;  Laterality: N/A;  Diabetic - oral meds  . COLONOSCOPY WITH PROPOFOL N/A 01/28/2019   Procedure: COLONOSCOPY WITH PROPOFOL;  Surgeon: TaVirgel ManifoldMD;  Location: [facility] ENDOSCOPY;  Service: Endoscopy;  Laterality: N/A;  . DIAGNOSTIC LAPAROSCOPY    . ESOPHAGOGASTRODUODENOSCOPY (EGD) WITH PROPOFOL N/A 01/28/2019   Procedure: ESOPHAGOGASTRODUODENOSCOPY (EGD) WITH PROPOFOL;  Surgeon: TaVirgel ManifoldMD;  Location: [facility] ENDOSCOPY;  Service:  Endoscopy;  Laterality: N/A;  . LAPAROSCOPIC APPENDECTOMY N/A 01/06/2015   Procedure: APPENDECTOMY LAPAROSCOPIC;  Surgeon: SeChristene LyeMD;  Location: [facility] ORS;  Service: General;  Laterality: N/A;  . LEFT HEART CATH AND CORONARY ANGIOGRAPHY Left 04/04/2019   Procedure: LEFT HEART CATH AND CORONARY ANGIOGRAPHY;  Surgeon: GoMinna MerrittsMD;  [place] [facility];  Service: Cardiovascular;  Laterality: Left;  . Marland KitchenASTECTOMY    . MASTECTOMY W/ SENTINEL NODE BIOPSY Left 08/26/2018   Procedure: PARTIAL MASTECTOMY WIDE EXCISION WITH SENTINEL LYMPH NODE BIOPSY LEFT;  Surgeon: ByRobert BellowMD;  Location: [facility] ORS;  Service: General;  Laterality: Left;  . POLYPECTOMY  05/24/2015   Procedure: POLYPECTOMY;  Surgeon: DaLucilla LameMD;  [place] Service: Endoscopy;;  . THYROIDECTOMY    . TRIGGER FINGER RELEASE      Medications:  Current Outpatient Medications on File Prior to Visit  Medication Sig  . anastrozole (ARIMIDEX) 1 MG tablet Take 1 tablet (1 mg total) by mouth daily.  . blood glucose meter kit and  supplies KIT Dispense based on patient and insurance preference. Use one time daily as directed, E11.9  . Calcium Carb-Cholecalciferol (CALCIUM 600 + D) 600-200 MG-UNIT TABS Take 1 tablet by mouth daily.   . cetirizine (ZYRTEC) 10 MG tablet Take 1 tablet (10 mg total) by mouth daily.  . cholecalciferol (VITAMIN D3) 25 MCG (1000 UT) tablet Take 1,000 Units by mouth daily.  [name] ELIQUIS 2.5 MG TABS tablet TAKE 1 TABLET BY MOUTH TWICE DAILY  . Insulin Pen Needle (PEN NEEDLES 3/16") 31G X 5 MM MISC 1 each by Does not apply route daily.  . isosorbide mononitrate (IMDUR) 60 MG 24 hr tablet TAKE ONE TABLET BY MOUTH AT BEDTIME AND HALF A TABLET EVERY MORNING  . levothyroxine (SYNTHROID) 150 MCG tablet Take 1 tablet (150 mcg total) by mouth daily before breakfast.  . losartan (COZAAR) 25 MG tablet Take 0.5 tablets (12.5 mg total) by mouth daily.  . metoprolol  succinate (TOPROL-XL) 50 MG 24 hr tablet TAKE ONE TABLET EVERY DAY WITH OR IMMEDIATELY FOLLOWING A MEAL  . Multiple Vitamins-Minerals (ZINC PO) Take 50 mg by mouth daily.   [name] MYRBETRIQ 50 MG TB24 tablet TAKE ONE TABLET EVERY DAY  . nitroGLYCERIN (NITROSTAT) 0.4 MG SL tablet Place 1 tablet (0.4 mg total) under the tongue every 5 (five) minutes as needed for chest pain.  [name] olopatadine (PATADAY) 0.1 % ophthalmic solution Place 1 drop into both eyes 2 (two) times daily.  . polyethylene glycol (MIRALAX MIX-IN PAX) 17 g packet Take 17 g by mouth daily.  . Polyethylene Glycol 400 (BLINK TEARS) 0.25 % SOLN Place 1 drop into both eyes 3 (three) times daily as needed (dry/irritated eyes.).  [name] trimethoprim (TRIMPEX) 100 MG tablet Take 100 mg by mouth daily.   No current facility-administered medications on file prior to visit.    Allergies:  Allergies  Allergen Reactions  . Hydrocodone Other (See Comments)    Unresponsive  . Aleve [Naproxen Sodium]     Hives & itching   . Contrast Media [Iodinated Diagnostic Agents]     Pt avoids due to kidney issues  . Ioxaglate Other (See Comments)    (iodine) Pt avoids due to kidney issues   . Oxycodone Other (See Comments)    hallucinations  . Ranexa [Ranolazine]     Sick on stomach  . Sulfa Antibiotics Other (See Comments)    Pt avoids due to kidney issues  . Tramadol Other (See Comments)    nightmares    Social History:  Social History   Socioeconomic History  . Marital status: Married    Spouse name: Not on file  . Number of children: 2  . Years of education: some college, Production assistant, radio   . Highest education level: Associate degree: occupational, Hotel manager, or vocational program  Occupational History  . Occupation: Retired  Tobacco Use  . Smoking status: Former Smoker    Packs/day: 0.50    Years: 15.00    Pack years: 7.50    Types: Cigarettes    Quit date: 03/14/2007    Years since quitting: 12.6  . Smokeless  tobacco: Never Used  Vaping Use  . Vaping Use: Never used  Substance and Sexual Activity  . Alcohol use: No    Alcohol/week: 0.0 standard drinks  . Drug use: Never  . Sexual activity: Yes    Birth control/protection: Post-menopausal  Other Topics Concern  . Not on file  Social History Narrative   Lives at home with  husband in [place]. Quit smoking 10 years ago; never alcohol. 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    Social Determinants of Health   Financial Resource Strain: Medium Risk  . Difficulty of Paying Living Expenses: Somewhat hard  Food Insecurity:   . Worried About Charity fundraiser in the Last Year: Not on file  . Ran Out of Food in the Last Year: Not on file  Transportation Needs:   . Lack of Transportation (Medical): Not on file  . Lack of Transportation (Non-Medical): Not on file  Physical Activity:   . Days of Exercise per Week: Not on file  . Minutes of Exercise per Session: Not on file  Stress:   . Feeling of Stress : Not on file  Social Connections:   . Frequency of Communication with Friends and Family: Not on file  . Frequency of Social Gatherings with Friends and Family: Not on file  . Attends Religious Services: Not on file  . Active Member of Clubs or Organizations: Not on file  . Attends Archivist Meetings: Not on file  . Marital Status: Not on file  Intimate Partner Violence:   . Fear of Current or Ex-Partner: Not on file  . Emotionally Abused: Not on file  . Physically Abused: Not on file  . Sexually Abused: Not on file   Social History   Tobacco Use  Smoking Status Former Smoker  . Packs/day: 0.50  . Years: 15.00  . Pack years: 7.50  . Types: Cigarettes  . Quit date: 03/14/2007  . Years since quitting: 12.6  Smokeless Tobacco Never Used   Social History   Substance and Sexual Activity  Alcohol Use No  . Alcohol/week: 0.0 standard drinks    Family History:  Family History    Problem Relation Age of Onset  . Hypertension Father   . Heart disease Father   . Heart attack Father   . Diabetes Father   . Cancer Mother        colon  . Heart attack Brother   . Diabetes Brother   . Hypertension Brother   . Heart disease Brother   . Heart attack Brother   . Diabetes Brother   . Hypertension Brother   . Heart disease Brother   . Heart attack Brother   . Diabetes Brother   . Hypertension Brother   . Heart disease Brother   . Cancer Sister        lung  . COPD Sister   . COPD Sister   . Breast cancer Neg Hx     Past medical history, surgical history, medications, allergies, family history and social history reviewed with patient today and changes made to appropriate areas of the chart.   Review of Systems  Constitutional: Negative.   HENT: Negative.   Eyes: Negative.   Respiratory: Negative.   Cardiovascular: Negative.   Gastrointestinal: Negative.   Genitourinary: Negative.   Musculoskeletal: Positive for back pain, joint pain and myalgias. Negative for falls and neck pain.  Skin: Negative.   Neurological: Negative.   Endo/Heme/Allergies: Negative.   Psychiatric/Behavioral: Negative.     All other ROS negative except what is listed above and in the HPI.      Objective:    BP 107/74   Pulse 70   Temp 97.9 F (36.6 C) (Oral)   Wt 214 lb (97.1 kg)   SpO2 94%   BMI 34.54 kg/m  Wt Readings from Last 3 Encounters:  11/11/19 214 lb (97.1 kg)  10/15/19 217 lb (98.4 kg)  09/08/19 217 lb 2 oz (98.5 kg)    Physical Exam Vitals and nursing note reviewed.  Constitutional:      General: She is not in acute distress.    Appearance: Normal appearance. She is not ill-appearing, toxic-appearing or diaphoretic.  HENT:     Head: Normocephalic and atraumatic.     Right Ear: Tympanic membrane, ear canal and external ear normal. There is no impacted cerumen.     Left Ear: Tympanic membrane, ear canal and external ear normal. There is no impacted  cerumen.     Nose: Nose normal. No congestion or rhinorrhea.     Mouth/Throat:     Mouth: Mucous membranes are moist.     Pharynx: Oropharynx is clear. No oropharyngeal exudate or posterior oropharyngeal erythema.  Eyes:     General: No scleral icterus.       Right eye: No discharge.        Left eye: No discharge.     Extraocular Movements: Extraocular movements intact.     Conjunctiva/sclera: Conjunctivae normal.     Pupils: Pupils are equal, round, and reactive to light.  Neck:     Vascular: No carotid bruit.  Cardiovascular:     Rate and Rhythm: Normal rate and regular rhythm.     Pulses: Normal pulses.     Heart sounds: No murmur heard.  No friction rub. No gallop.   Pulmonary:     Effort: Pulmonary effort is normal. No respiratory distress.     Breath sounds: Normal breath sounds. No stridor. No wheezing, rhonchi or rales.  Chest:     Chest wall: No tenderness.  Abdominal:     General: Abdomen is flat. Bowel sounds are normal. There is no distension.     Palpations: Abdomen is soft. There is no mass.     Tenderness: There is no abdominal tenderness. There is no right CVA tenderness, left CVA tenderness, guarding or rebound.     Hernia: No hernia is present.  Genitourinary:    Comments: Breast and pelvic exams deferred with shared decision making Musculoskeletal:        General: No swelling, tenderness, deformity or signs of injury.     Cervical back: Normal range of motion and neck supple. No rigidity. No muscular tenderness.     Right lower leg: No edema.     Left lower leg: No edema.  Lymphadenopathy:     Cervical: No cervical adenopathy.  Skin:    General: Skin is warm and dry.     Capillary Refill: Capillary refill takes less than 2 seconds.     Coloration: Skin is not jaundiced or pale.     Findings: No bruising, erythema, lesion or rash.  Neurological:     General: No focal deficit present.     Mental Status: She is alert and oriented to person, place, and  time. Mental status is at baseline.     Cranial Nerves: No cranial nerve deficit.     Sensory: No sensory deficit.     Motor: No weakness.     Coordination: Coordination normal.     Gait: Gait normal.     Deep Tendon Reflexes: Reflexes normal.  Psychiatric:        Mood and Affect: Mood normal.        Behavior: Behavior normal.        Thought Content: Thought content  normal.        Judgment: Judgment normal.     6CIT Screen 11/11/2019 10/31/2017 10/25/2016  What Year? 0 points 0 points 0 points  What month? 0 points 0 points 0 points  What time? 0 points 0 points 0 points  Count back from 20 0 points 0 points 0 points  Months in reverse 0 points 0 points 0 points  Repeat phrase 0 points 0 points 0 points  Total Score 0 0 0    Results for orders placed or performed in visit on 11/11/19  Bayer DCA Hb A1c Waived  Result Value Ref Range   HB A1C (BAYER DCA - WAIVED) 7.7 (H) <7.0 %  CBC with Differential/Platelet  Result Value Ref Range   WBC 7.7 3.4 - 10.8 x10E3/uL   RBC 4.62 3.77 - 5.28 x10E6/uL   Hemoglobin 13.5 11.1 - 15.9 g/dL   Hematocrit 42.3 34.0 - 46.6 %   MCV 92 79 - 97 fL   MCH 29.2 26.6 - 33.0 pg   MCHC 31.9 31 - 35 g/dL   RDW 12.7 11.7 - 15.4 %   Platelets 250 150 - 450 x10E3/uL   Neutrophils 48 Not Estab. %   Lymphs 37 Not Estab. %   Monocytes 10 Not Estab. %   Eos 3 Not Estab. %   Basos 1 Not Estab. %   Neutrophils Absolute 3.8 1 - 7 x10E3/uL   Lymphocytes Absolute 2.8 0 - 3 x10E3/uL   Monocytes Absolute 0.8 0 - 0 x10E3/uL   EOS (ABSOLUTE) 0.2 0.0 - 0.4 x10E3/uL   Basophils Absolute 0.1 0 - 0 x10E3/uL   Immature Granulocytes 1 Not Estab. %   Immature Grans (Abs) 0.1 0.0 - 0.1 x10E3/uL  Comprehensive metabolic panel  Result Value Ref Range   Glucose 109 (H) 65 - 99 mg/dL   BUN 40 (H) 8 - 27 mg/dL   Creatinine, Ser 1.77 (H) 0.57 - 1.00 mg/dL   GFR calc non Af Amer 28 (L) >59 mL/min/1.73   GFR calc Af Amer 32 (L) >59 mL/min/1.73   BUN/Creatinine Ratio 23  12 - 28   Sodium 141 134 - 144 mmol/L   Potassium 4.9 3.5 - 5.2 mmol/L   Chloride 102 96 - 106 mmol/L   CO2 25 20 - 29 mmol/L   Calcium 9.1 8.7 - 10.3 mg/dL   Total Protein 6.9 6.0 - 8.5 g/dL   Albumin 4.1 3.7 - 4.7 g/dL   Globulin, Total 2.8 1.5 - 4.5 g/dL   Albumin/Globulin Ratio 1.5 1.2 - 2.2   Bilirubin Total 0.4 0.0 - 1.2 mg/dL   Alkaline Phosphatase 80 48 - 121 IU/L   AST 14 0 - 40 IU/L   ALT 10 0 - 32 IU/L  Lipid Panel w/o Chol/HDL Ratio  Result Value Ref Range   Cholesterol, Total 122 100 - 199 mg/dL   Triglycerides 175 (H) 0 - 149 mg/dL   HDL 40 >39 mg/dL   VLDL Cholesterol Cal 29 5 - 40 mg/dL   LDL Chol Calc (NIH) 53 0 - 99 mg/dL  TSH  Result Value Ref Range   TSH 0.865 0.450 - 4.500 uIU/mL      Assessment & Plan:   Problem List Items Addressed This Visit      Endocrine   Uncontrolled type 2 diabetes mellitus with hyperglycemia, with long-term current use of insulin (HCC)    Doing great with A1c of 7.7. Will continue current regimen. Cleared for surgery. Call with  any concerns.       Relevant Medications   rosuvastatin (CRESTOR) 40 MG tablet   glipiZIDE (GLUCOTROL) 5 MG tablet   insulin degludec (TRESIBA FLEXTOUCH) 100 UNIT/ML FlexTouch Pen   Dulaglutide (TRULICITY) 3 ZS/8.2LM SOPN   Hypothyroid    Rechecking labs today. Await results. Treat as needed. Call with any concerns.       Relevant Orders   CBC with Differential/Platelet (Completed)   Comprehensive metabolic panel (Completed)   TSH (Completed)   Type 2 diabetes mellitus with diabetic chronic kidney disease ([place])    Doing great with A1c of 7.7. Will continue current regimen. Cleared for surgery. Call with any concerns.       Relevant Medications   rosuvastatin (CRESTOR) 40 MG tablet   glipiZIDE (GLUCOTROL) 5 MG tablet   insulin degludec (TRESIBA FLEXTOUCH) 100 UNIT/ML FlexTouch Pen   Dulaglutide (TRULICITY) 3 BE/6.7JQ SOPN   Other Relevant Orders   Bayer DCA Hb A1c Waived (Completed)   CBC  with Differential/Platelet (Completed)   Comprehensive metabolic panel (Completed)     Genitourinary   Benign hypertensive renal disease    Under good control on current regimen. Continue current regimen. Continue to monitor. Call with any concerns. Refills given. Labs drawn today.       Relevant Orders   CBC with Differential/Platelet (Completed)   Comprehensive metabolic panel (Completed)     Other   Hyperlipidemia    Under good control on current regimen. Continue current regimen. Continue to monitor. Call with any concerns. Refills given. Labs drawn today.       Relevant Medications   rosuvastatin (CRESTOR) 40 MG tablet   Other Relevant Orders   CBC with Differential/Platelet (Completed)   Comprehensive metabolic panel (Completed)   Lipid Panel w/o Chol/HDL Ratio (Completed)   Morbid obesity due to excess calories ([place])    Congratulated patient on another 3 lb weight loss. Continue diet and exercise with goal of losing 1-2 lbs per week. Call with any concerns.       Relevant Medications   glipiZIDE (GLUCOTROL) 5 MG tablet   insulin degludec (TRESIBA FLEXTOUCH) 100 UNIT/ML FlexTouch Pen   Dulaglutide (TRULICITY) 3 GB/2.0FE SOPN    Other Visit Diagnoses    Wellness examination    -  Primary   Preventative care discussed today as below.    Preop exam for internal medicine       Cleared for surgery. Call wtih any concerns.        Preventative Services:  Health Risk Assessment and Personalized Prevention Plan: Done today Bone Mass Measurements: Up to date Breast Cancer Screening: up to date CVD Screening: done today Cervical Cancer Screening: N/A Colon Cancer Screening: up to date Depression Screening: up to date Diabetes Screening: done today Glaucoma Screening: see your eye doctor Hepatitis B vaccine: N/A Hepatitis C screening: up to date HIV Screening: up to date Flu Vaccine: will get later in the fall Lung cancer Screening: N/A Obesity Screening: Done  today Pneumonia Vaccines (2): up to date STI Screening: N/A  Follow up plan: Return in about 3 months (around 02/10/2020).   LABORATORY TESTING:  - Pap smear: not applicable  IMMUNIZATIONS:   - Tdap: Tetanus vaccination status reviewed: Declined - Influenza: out of stock - Pneumovax: Up to date - Prevnar: Up to date - COVID vaccine: Up to date  SCREENING: -Mammogram: Up to date  - Colonoscopy: Up to date  - Bone Density: Up to date   PATIENT COUNSELING:  Advised to take 1 mg of folate supplement per day if capable of pregnancy.   Sexuality: Discussed sexually transmitted diseases, partner selection, use of condoms, avoidance of unintended pregnancy  and contraceptive alternatives.   Advised to avoid cigarette smoking.  I discussed with the patient that most people either abstain from alcohol or drink within safe limits (<=14/week and <=4 drinks/occasion for males, <=7/weeks and <= 3 drinks/occasion for females) and that the risk for alcohol disorders and other health effects rises proportionally with the number of drinks per week and how often a drinker exceeds daily limits.  Discussed cessation/primary prevention of drug use and availability of treatment for abuse.   Diet: Encouraged to adjust caloric intake to maintain  or achieve ideal body weight, to reduce intake of dietary saturated fat and total fat, to limit sodium intake by avoiding high sodium foods and not adding table salt, and to maintain adequate dietary potassium and calcium preferably from fresh fruits, vegetables, and low-fat dairy products.    stressed the importance of regular exercise  Injury prevention: Discussed safety belts, safety helmets, smoke detector, smoking near bedding or upholstery.   Dental health: Discussed importance of regular tooth brushing, flossing, and dental visits.    NEXT PREVENTATIVE PHYSICAL DUE IN 1 YEAR. Return in about 3 months (around 02/10/2020).

## 2019-11-11 NOTE — Telephone Encounter (Signed)
She tried the samples and did not have a problem so okay to fill

## 2019-11-11 NOTE — Telephone Encounter (Signed)
rx sent

## 2019-11-11 NOTE — Assessment & Plan Note (Signed)
Congratulated patient on another 3 lb weight loss. Continue diet and exercise with goal of losing 1-2 lbs per week. Call with any concerns.

## 2019-11-11 NOTE — Assessment & Plan Note (Signed)
Under good control on current regimen. Continue current regimen. Continue to monitor. Call with any concerns. Refills given. Labs drawn today.   

## 2019-11-11 NOTE — Assessment & Plan Note (Addendum)
Rechecking labs today. Await results. Treat as needed. Call with any concerns.

## 2019-11-11 NOTE — Telephone Encounter (Signed)
Incoming call from [place] [facility] stating that uribel has an interaction with her duloxetine. Proceed with filling or not? Please advise.

## 2019-11-11 NOTE — Addendum Note (Signed)
Addended by: [name] on: 11/11/2019 04:01 PM   Modules accepted: Orders

## 2019-11-12 ENCOUNTER — Telehealth: Payer: Self-pay | Admitting: Family Medicine

## 2019-11-12 ENCOUNTER — Ambulatory Visit: Payer: Self-pay | Admitting: Urology

## 2019-11-12 ENCOUNTER — Other Ambulatory Visit: Payer: Self-pay | Admitting: *Deleted

## 2019-11-12 LAB — LIPID PANEL W/O CHOL/HDL RATIO
Cholesterol, Total: 122 mg/dL (ref 100–199)
HDL: 40 mg/dL (ref >39)
LDL Chol Calc (NIH): 53 mg/dL (ref 0–99)
Triglycerides: 175 mg/dL — ABNORMAL HIGH (ref 0–149)
VLDL Cholesterol Cal: 29 mg/dL (ref 5–40)

## 2019-11-12 LAB — CBC WITH DIFFERENTIAL/PLATELET
Basophils Absolute: 0.1 10*3/uL (ref 0.0–0.2)
Basos: 1 %
EOS (ABSOLUTE): 0.2 10*3/uL (ref 0.0–0.4)
Eos: 3 %
Hematocrit: 42.3 % (ref 34.0–46.6)
Hemoglobin: 13.5 g/dL (ref 11.1–15.9)
Immature Grans (Abs): 0.1 10*3/uL (ref 0.0–0.1)
Immature Granulocytes: 1 %
Lymphocytes Absolute: 2.8 10*3/uL (ref 0.7–3.1)
Lymphs: 37 %
MCH: 29.2 pg (ref 26.6–33.0)
MCHC: 31.9 g/dL (ref 31.5–35.7)
MCV: 92 fL (ref 79–97)
Monocytes Absolute: 0.8 10*3/uL (ref 0.1–0.9)
Monocytes: 10 %
Neutrophils Absolute: 3.8 10*3/uL (ref 1.4–7.0)
Neutrophils: 48 %
Platelets: 250 10*3/uL (ref 150–450)
RBC: 4.62 x10E6/uL (ref 3.77–5.28)
RDW: 12.7 % (ref 11.7–15.4)
WBC: 7.7 10*3/uL (ref 3.4–10.8)

## 2019-11-12 LAB — COMPREHENSIVE METABOLIC PANEL
ALT: 10 IU/L (ref 0–32)
AST: 14 IU/L (ref 0–40)
Albumin/Globulin Ratio: 1.5 (ref 1.2–2.2)
Albumin: 4.1 g/dL (ref 3.7–4.7)
Alkaline Phosphatase: 80 IU/L (ref 48–121)
BUN/Creatinine Ratio: 23 (ref 12–28)
BUN: 40 mg/dL — ABNORMAL HIGH (ref 8–27)
Bilirubin Total: 0.4 mg/dL (ref 0.0–1.2)
CO2: 25 mmol/L (ref 20–29)
Calcium: 9.1 mg/dL (ref 8.7–10.3)
Chloride: 102 mmol/L (ref 96–106)
Creatinine, Ser: 1.77 mg/dL — ABNORMAL HIGH (ref 0.57–1.00)
GFR calc Af Amer: 32 mL/min/{1.73_m2} — ABNORMAL LOW (ref >59)
GFR calc non Af Amer: 28 mL/min/{1.73_m2} — ABNORMAL LOW (ref >59)
Globulin, Total: 2.8 g/dL (ref 1.5–4.5)
Glucose: 109 mg/dL — ABNORMAL HIGH (ref 65–99)
Potassium: 4.9 mmol/L (ref 3.5–5.2)
Sodium: 141 mmol/L (ref 134–144)
Total Protein: 6.9 g/dL (ref 6.0–8.5)

## 2019-11-12 LAB — TSH: TSH: 0.865 u[IU]/mL (ref 0.450–4.500)

## 2019-11-12 NOTE — Telephone Encounter (Signed)
error 

## 2019-11-12 NOTE — Telephone Encounter (Signed)
Copied from [place] [phone]. Topic: General - Inquiry >> Nov 12, 2019  3:56 PM Alease Frame wrote: Reason for CRM: Pt is wanting a call back from [place] or her nurse regarding medication she asking to be prescribed . Please advise

## 2019-11-13 ENCOUNTER — Other Ambulatory Visit: Payer: Self-pay | Admitting: Oncology

## 2019-11-13 DIAGNOSIS — E875 Hyperkalemia: Secondary | ICD-10-CM | POA: Diagnosis not present

## 2019-11-13 DIAGNOSIS — Z7901 Long term (current) use of anticoagulants: Secondary | ICD-10-CM | POA: Diagnosis not present

## 2019-11-13 DIAGNOSIS — E1122 Type 2 diabetes mellitus with diabetic chronic kidney disease: Secondary | ICD-10-CM | POA: Diagnosis present

## 2019-11-13 DIAGNOSIS — G8918 Other acute postprocedural pain: Secondary | ICD-10-CM | POA: Diagnosis not present

## 2019-11-13 DIAGNOSIS — M25561 Pain in right knee: Secondary | ICD-10-CM | POA: Diagnosis not present

## 2019-11-13 DIAGNOSIS — E119 Type 2 diabetes mellitus without complications: Secondary | ICD-10-CM | POA: Diagnosis not present

## 2019-11-13 DIAGNOSIS — Z86711 Personal history of pulmonary embolism: Secondary | ICD-10-CM | POA: Diagnosis not present

## 2019-11-13 DIAGNOSIS — M1711 Unilateral primary osteoarthritis, right knee: Secondary | ICD-10-CM | POA: Diagnosis present

## 2019-11-13 DIAGNOSIS — Z87440 Personal history of urinary (tract) infections: Secondary | ICD-10-CM | POA: Diagnosis not present

## 2019-11-13 DIAGNOSIS — K5900 Constipation, unspecified: Secondary | ICD-10-CM | POA: Diagnosis not present

## 2019-11-13 DIAGNOSIS — E669 Obesity, unspecified: Secondary | ICD-10-CM | POA: Diagnosis present

## 2019-11-13 DIAGNOSIS — J45909 Unspecified asthma, uncomplicated: Secondary | ICD-10-CM | POA: Diagnosis present

## 2019-11-13 DIAGNOSIS — K219 Gastro-esophageal reflux disease without esophagitis: Secondary | ICD-10-CM | POA: Diagnosis present

## 2019-11-13 DIAGNOSIS — Z8673 Personal history of transient ischemic attack (TIA), and cerebral infarction without residual deficits: Secondary | ICD-10-CM | POA: Diagnosis not present

## 2019-11-13 DIAGNOSIS — E1151 Type 2 diabetes mellitus with diabetic peripheral angiopathy without gangrene: Secondary | ICD-10-CM | POA: Diagnosis present

## 2019-11-13 DIAGNOSIS — D62 Acute posthemorrhagic anemia: Secondary | ICD-10-CM | POA: Diagnosis not present

## 2019-11-13 DIAGNOSIS — I10 Essential (primary) hypertension: Secondary | ICD-10-CM | POA: Diagnosis not present

## 2019-11-13 DIAGNOSIS — Z7984 Long term (current) use of oral hypoglycemic drugs: Secondary | ICD-10-CM | POA: Diagnosis not present

## 2019-11-13 DIAGNOSIS — Z87891 Personal history of nicotine dependence: Secondary | ICD-10-CM | POA: Diagnosis not present

## 2019-11-13 DIAGNOSIS — R0902 Hypoxemia: Secondary | ICD-10-CM | POA: Diagnosis not present

## 2019-11-13 DIAGNOSIS — Z853 Personal history of malignant neoplasm of breast: Secondary | ICD-10-CM | POA: Diagnosis not present

## 2019-11-13 DIAGNOSIS — Z79890 Hormone replacement therapy (postmenopausal): Secondary | ICD-10-CM | POA: Diagnosis not present

## 2019-11-13 DIAGNOSIS — Z6834 Body mass index (BMI) 34.0-34.9, adult: Secondary | ICD-10-CM | POA: Diagnosis not present

## 2019-11-13 DIAGNOSIS — N183 Chronic kidney disease, stage 3 unspecified: Secondary | ICD-10-CM | POA: Diagnosis present

## 2019-11-13 DIAGNOSIS — I2510 Atherosclerotic heart disease of native coronary artery without angina pectoris: Secondary | ICD-10-CM | POA: Diagnosis present

## 2019-11-13 DIAGNOSIS — Z9861 Coronary angioplasty status: Secondary | ICD-10-CM | POA: Diagnosis not present

## 2019-11-13 DIAGNOSIS — R918 Other nonspecific abnormal finding of lung field: Secondary | ICD-10-CM

## 2019-11-13 DIAGNOSIS — I129 Hypertensive chronic kidney disease with stage 1 through stage 4 chronic kidney disease, or unspecified chronic kidney disease: Secondary | ICD-10-CM | POA: Diagnosis present

## 2019-11-13 DIAGNOSIS — E039 Hypothyroidism, unspecified: Secondary | ICD-10-CM | POA: Diagnosis present

## 2019-11-13 DIAGNOSIS — E785 Hyperlipidemia, unspecified: Secondary | ICD-10-CM | POA: Diagnosis present

## 2019-11-13 DIAGNOSIS — Z792 Long term (current) use of antibiotics: Secondary | ICD-10-CM | POA: Diagnosis not present

## 2019-11-13 HISTORY — PX: TOTAL KNEE ARTHROPLASTY: SHX125

## 2019-11-13 NOTE — Telephone Encounter (Signed)
Called and LVM asking for patient to please return my call. Please find out what medication patient is referring to and why she would like to be put on it.

## 2019-11-13 NOTE — Progress Notes (Signed)
  Pulmonary Nodule Clinic Telephone Note [place]   Received referral from Dr. Wynetta Emery.   HPI: [name] is a 74 year old female with past medical history significant for CAD, COPD, CKD stage III, hypertension, hyperlipidemia, history of breast cancer who recently had a CT chest for a fall causing left-sided rib pain and shortness of breath.  Imaging showed no evidence of traumatic injury within the chest but incidentally showed a 7 mm subpleural nodule which is increased in size compared to previously noted lung nodule from 03/01/2018 (11mm).  Follow-up in 3 to 6 months was recommended to ensure stability.  Review and Recommendations: I personally reviewed all patient's previous imaging including most recent CT chest from 07/17/2019.  I recommend follow-up with noncontrast chest CT in the next 1 to 2 weeks.  Social History: She is a former smoker.   Tobacco Use: Medium Risk  . Smoking Tobacco Use: Former Smoker  . Smokeless Tobacco Use: Never Used    High risk factors include: History of heavy smoking, exposure to asbestos, radium or uranium, personal family history of lung cancer, older age, sex (females greater than males), race (black and native [place] greater than weight), marginal speculation, upper lobe location, multiplicity (less than 5 nodules increases risk for malignancy) and emphysema and/or pulmonary fibrosis.   This recommendation follows the consensus statement: Guidelines for Management of Incidental Pulmonary Nodules Detected on CT Images: From the Fleischner Society 2017; Radiology 2017; 284:228-243.    I have placed order for CT scan without contrast to be completed in the next 1 to 2 weeks.  Disposition: Order placed for repeat CT chest. Will notify [place] P[place] in scheduling. [place] to call patient with appointment date and time. Return to pulmonary nodule clinic a few days after his repeat imaging to discuss results and plan moving  forward.  Faythe Casa, NP 11/13/2019 9:49 AM

## 2019-11-14 ENCOUNTER — Other Ambulatory Visit: Payer: Self-pay | Admitting: Family Medicine

## 2019-11-14 ENCOUNTER — Telehealth: Payer: Self-pay | Admitting: *Deleted

## 2019-11-14 DIAGNOSIS — N289 Disorder of kidney and ureter, unspecified: Secondary | ICD-10-CM

## 2019-11-14 MED ORDER — URIBEL 118 MG PO CAPS
1.0000 | ORAL_CAPSULE | Freq: Three times a day (TID) | ORAL | 3 refills | Status: DC | PRN
Start: 2019-11-14 — End: 2020-03-08

## 2019-11-14 NOTE — Telephone Encounter (Signed)
Patient notified

## 2019-11-14 NOTE — Telephone Encounter (Signed)
Spoke with patient regarding referral to the lung nodule clinic and to review appts. Pt stated that she is currently in the hospital from having knee replacement surgery. Will call pt back in 1-2 months to reschedule appts in the lung nodule clinic. Pt verbalized understanding.

## 2019-11-14 NOTE — Telephone Encounter (Signed)
Called and spoke to patient. She states that she was supposed to get a prescription for the Uribel but for some reason Dr. Bernardo Heater voided the RX. She states that she has tried to reach out to their office but has not got a call back. Patient states she is currently in the hospital and is having a tough time with her bladder. States she has no control over it. She states that she is up to having the medication changed if Dr. Wynetta Emery would like.

## 2019-11-14 NOTE — Telephone Encounter (Signed)
Rx sent to her pharmacy 

## 2019-11-17 ENCOUNTER — Encounter: Payer: Self-pay | Admitting: Family Medicine

## 2019-11-17 NOTE — Assessment & Plan Note (Signed)
Doing great with A1c of 7.7. Will continue current regimen. Cleared for surgery. Call with any concerns.

## 2019-11-18 DIAGNOSIS — Z96659 Presence of unspecified artificial knee joint: Secondary | ICD-10-CM | POA: Insufficient documentation

## 2019-11-19 DIAGNOSIS — M1712 Unilateral primary osteoarthritis, left knee: Secondary | ICD-10-CM | POA: Diagnosis not present

## 2019-11-19 DIAGNOSIS — Z96651 Presence of right artificial knee joint: Secondary | ICD-10-CM | POA: Diagnosis not present

## 2019-11-19 DIAGNOSIS — E119 Type 2 diabetes mellitus without complications: Secondary | ICD-10-CM | POA: Diagnosis not present

## 2019-11-19 DIAGNOSIS — Z471 Aftercare following joint replacement surgery: Secondary | ICD-10-CM | POA: Diagnosis not present

## 2019-11-19 DIAGNOSIS — R2681 Unsteadiness on feet: Secondary | ICD-10-CM | POA: Diagnosis not present

## 2019-11-21 ENCOUNTER — Telehealth: Payer: Self-pay | Admitting: General Practice

## 2019-11-21 NOTE — Telephone Encounter (Signed)
  Chronic Care Management   Note  11/21/2019 Name: Tammy [name] MRN: [id number] DOB: [birth year]  RNCM initial call today for Chronic Disease Management and Care Coordination Needs. The patient states that she was waiting for PT to come work with her post knee replacement and was not feeling the best today and would appreciate a call at a different time. Will plan on following up with the patient at a later time.   Follow up plan: The care management team will reach out to the patient again over the next 14 to 30 days.   Noreene Larsson RN, MSN, [facility] Mobile: [phone]

## 2019-11-24 ENCOUNTER — Other Ambulatory Visit: Payer: Medicare Other

## 2019-11-24 DIAGNOSIS — Z471 Aftercare following joint replacement surgery: Secondary | ICD-10-CM | POA: Diagnosis not present

## 2019-11-24 DIAGNOSIS — E119 Type 2 diabetes mellitus without complications: Secondary | ICD-10-CM | POA: Diagnosis not present

## 2019-11-24 DIAGNOSIS — R2681 Unsteadiness on feet: Secondary | ICD-10-CM | POA: Diagnosis not present

## 2019-11-24 DIAGNOSIS — M1712 Unilateral primary osteoarthritis, left knee: Secondary | ICD-10-CM | POA: Diagnosis not present

## 2019-11-24 DIAGNOSIS — Z96651 Presence of right artificial knee joint: Secondary | ICD-10-CM | POA: Diagnosis not present

## 2019-11-25 DIAGNOSIS — Z09 Encounter for follow-up examination after completed treatment for conditions other than malignant neoplasm: Secondary | ICD-10-CM | POA: Diagnosis not present

## 2019-11-25 DIAGNOSIS — S46912A Strain of unspecified muscle, fascia and tendon at shoulder and upper arm level, left arm, initial encounter: Secondary | ICD-10-CM | POA: Diagnosis not present

## 2019-11-26 DIAGNOSIS — Z96651 Presence of right artificial knee joint: Secondary | ICD-10-CM | POA: Diagnosis not present

## 2019-11-26 DIAGNOSIS — M1712 Unilateral primary osteoarthritis, left knee: Secondary | ICD-10-CM | POA: Diagnosis not present

## 2019-11-26 DIAGNOSIS — E119 Type 2 diabetes mellitus without complications: Secondary | ICD-10-CM | POA: Diagnosis not present

## 2019-11-26 DIAGNOSIS — Z471 Aftercare following joint replacement surgery: Secondary | ICD-10-CM | POA: Diagnosis not present

## 2019-11-26 DIAGNOSIS — R2681 Unsteadiness on feet: Secondary | ICD-10-CM | POA: Diagnosis not present

## 2019-11-27 ENCOUNTER — Other Ambulatory Visit: Payer: Medicare Other

## 2019-11-27 ENCOUNTER — Other Ambulatory Visit: Payer: Self-pay

## 2019-11-27 ENCOUNTER — Ambulatory Visit: Payer: Medicare Other | Admitting: Oncology

## 2019-11-27 DIAGNOSIS — N289 Disorder of kidney and ureter, unspecified: Secondary | ICD-10-CM

## 2019-11-28 DIAGNOSIS — R2681 Unsteadiness on feet: Secondary | ICD-10-CM | POA: Diagnosis not present

## 2019-11-28 DIAGNOSIS — E119 Type 2 diabetes mellitus without complications: Secondary | ICD-10-CM | POA: Diagnosis not present

## 2019-11-28 DIAGNOSIS — M1712 Unilateral primary osteoarthritis, left knee: Secondary | ICD-10-CM | POA: Diagnosis not present

## 2019-11-28 DIAGNOSIS — Z96651 Presence of right artificial knee joint: Secondary | ICD-10-CM | POA: Diagnosis not present

## 2019-11-28 DIAGNOSIS — Z471 Aftercare following joint replacement surgery: Secondary | ICD-10-CM | POA: Diagnosis not present

## 2019-11-28 LAB — BASIC METABOLIC PANEL
BUN/Creatinine Ratio: 17 (ref 12–28)
BUN: 28 mg/dL — ABNORMAL HIGH (ref 8–27)
CO2: 23 mmol/L (ref 20–29)
Calcium: 9.6 mg/dL (ref 8.7–10.3)
Chloride: 102 mmol/L (ref 96–106)
Creatinine, Ser: 1.61 mg/dL — ABNORMAL HIGH (ref 0.57–1.00)
GFR calc Af Amer: 36 mL/min/{1.73_m2} — ABNORMAL LOW (ref >59)
GFR calc non Af Amer: 31 mL/min/{1.73_m2} — ABNORMAL LOW (ref >59)
Glucose: 108 mg/dL — ABNORMAL HIGH (ref 65–99)
Potassium: 5.4 mmol/L — ABNORMAL HIGH (ref 3.5–5.2)
Sodium: 140 mmol/L (ref 134–144)

## 2019-12-01 ENCOUNTER — Other Ambulatory Visit: Payer: Self-pay | Admitting: *Deleted

## 2019-12-01 DIAGNOSIS — Z471 Aftercare following joint replacement surgery: Secondary | ICD-10-CM | POA: Diagnosis not present

## 2019-12-01 DIAGNOSIS — C50312 Malignant neoplasm of lower-inner quadrant of left female breast: Secondary | ICD-10-CM

## 2019-12-01 DIAGNOSIS — E119 Type 2 diabetes mellitus without complications: Secondary | ICD-10-CM | POA: Diagnosis not present

## 2019-12-01 DIAGNOSIS — Z96651 Presence of right artificial knee joint: Secondary | ICD-10-CM | POA: Diagnosis not present

## 2019-12-01 DIAGNOSIS — R2681 Unsteadiness on feet: Secondary | ICD-10-CM | POA: Diagnosis not present

## 2019-12-01 DIAGNOSIS — M1712 Unilateral primary osteoarthritis, left knee: Secondary | ICD-10-CM | POA: Diagnosis not present

## 2019-12-02 ENCOUNTER — Inpatient Hospital Stay: Payer: Medicare Other | Admitting: Internal Medicine

## 2019-12-02 ENCOUNTER — Inpatient Hospital Stay: Payer: Medicare Other

## 2019-12-03 ENCOUNTER — Other Ambulatory Visit: Payer: Self-pay

## 2019-12-03 ENCOUNTER — Ambulatory Visit (INDEPENDENT_AMBULATORY_CARE_PROVIDER_SITE_OTHER): Payer: Medicare Other | Admitting: Urology

## 2019-12-03 ENCOUNTER — Encounter: Payer: Self-pay | Admitting: Urology

## 2019-12-03 VITALS — BP 117/78 | HR 85 | Ht 65.0 in | Wt 211.0 lb

## 2019-12-03 DIAGNOSIS — R3129 Other microscopic hematuria: Secondary | ICD-10-CM | POA: Diagnosis not present

## 2019-12-03 DIAGNOSIS — N390 Urinary tract infection, site not specified: Secondary | ICD-10-CM | POA: Diagnosis not present

## 2019-12-03 DIAGNOSIS — M1712 Unilateral primary osteoarthritis, left knee: Secondary | ICD-10-CM | POA: Diagnosis not present

## 2019-12-03 DIAGNOSIS — E119 Type 2 diabetes mellitus without complications: Secondary | ICD-10-CM | POA: Diagnosis not present

## 2019-12-03 DIAGNOSIS — R300 Dysuria: Secondary | ICD-10-CM | POA: Diagnosis not present

## 2019-12-03 DIAGNOSIS — Z471 Aftercare following joint replacement surgery: Secondary | ICD-10-CM | POA: Diagnosis not present

## 2019-12-03 DIAGNOSIS — Z96651 Presence of right artificial knee joint: Secondary | ICD-10-CM | POA: Diagnosis not present

## 2019-12-03 DIAGNOSIS — R2681 Unsteadiness on feet: Secondary | ICD-10-CM | POA: Diagnosis not present

## 2019-12-03 NOTE — Progress Notes (Signed)
12/03/2019 11:05 AM   Tammy [name] 08/31/1945 [id number]  Referring provider: [name], DO [place],  Tammy [name] [id number]  Chief Complaint  Patient presents with  . Hematuria    Urologic history: 1.  Recurrent UTI -Trimethoprim prophylaxis -D-mannose  2.  Right UPJ obstruction -Asymptomatic -Initially diagnosed 2009 -Lasix renogram with 18% function -Saw Dr. Alinda Money for second opinion, no treatment recommended  3.  Overactive bladder -Myrbetriq 50 mg -Presently off med   HPI: 74 y.o. female presents for follow-up.   Cystoscopy 10/15/2019 for microhematuria without mucosal abnormalities identified  Due to contrast allergy MR urogram was ordered however patient apparently refused contrast and radiology performed a noncontrast study.  Right hydronephrosis and atrophy from chronic UPJ obstruction noted.  No renal masses or obvious abnormalities  Her terminal dysuria improved on Uribel  Previously underwent right knee replacement   PMH: Past Medical History:  Diagnosis Date  . Asthma   . Benign neoplasm of colon   . Breast cancer (Tammy [name]) 08/2018   left breast  . Cervical disc disease    "bulging" - no limitations per pt  . Chronic diastolic CHF (congestive heart failure) (Tammy [name])    a. 07/2012 Echo: Nl EF. mild inferoseptal HK, Gr 2 DD, mild conc LVH, mild PR/TR, mild PAH.  . CKD (chronic kidney disease), stage III   . COPD (chronic obstructive pulmonary disease) ([place])   . Coronary artery disease    a. 11/2011 Cath: LAD 59md, LCX min irregs, RCA 70p, 1050mith L->R collats, EF 60%-->Med Rx.  . Diabetes mellitus without complication (Tammy [name]  . Fusion of lumbar spine 12/22/2015  . GERD (gastroesophageal reflux disease)   . History of DVT (deep vein thrombosis)   . History of tobacco abuse    a. Quit 2011.  . Marland Kitchenyperlipidemia   . Hypertension   . Hypothyroidism   . Obesity   . Palpitations   . PE (pulmonary embolism) 10/15  . Personal history of radiation  therapy   . PONV (postoperative nausea and vomiting)   . Pulmonary embolus ([place]10/15/2015   RLL. Presented with SOB and elevated d-dimer.   . Marland KitchenVD (peripheral vascular disease) (Tammy [name]   a. PTA of right leg and left femoral artery with stenosis.  . Renal insufficiency   . [name]   TIAs - no deficits    Surgical History: Past Surgical History:  Procedure Laterality Date  . ANGIOPLASTY / STENTING FEMORAL     PVD; angioplasty right leg and left femoral artery with stenosis.   . APPENDECTOMY  Oct. 2016  . BACK SURGERY  03/2012   nerve stimulator inserted  . BACK SURGERY     screws, rods replaced with new hardware.  . Marland KitchenACK SURGERY  11/2016  . BREAST LUMPECTOMY Left 08/26/2018  . BREAST SURGERY    . CARDIAC CATHETERIZATION  12/07/2011   Mid LAD 50%, distal LAD 50%, mid RCA 70%, distal RCA 100%   . CARDIAC CATHETERIZATION  01/04/2011   100% occluded mid RCA with good collaterals from distal LAD, normal LVEF.  . Marland KitchenHOLECYSTECTOMY    . COLONOSCOPY  2013  . COLONOSCOPY WITH PROPOFOL N/A 05/24/2015   Procedure: COLONOSCOPY WITH PROPOFOL;  Surgeon: DaLucilla LameMD;  [place]: [place];  Laterality: N/A;  Diabetic - oral meds  . COLONOSCOPY WITH PROPOFOL N/A 01/28/2019   Procedure: COLONOSCOPY WITH PROPOFOL;  Surgeon: TaVirgel ManifoldMD;  Location: [facility] END[facility]COPY;  Service:  Endoscopy;  Laterality: N/A;  . DIAGNOSTIC LAPAROSCOPY    . ESOPHAGOGASTRODUODENOSCOPY (EGD) WITH PROPOFOL N/A 01/28/2019   Procedure: ESOPHAGOGASTRODUODENOSCOPY (EGD) WITH PROPOFOL;  Surgeon: [name], MD;  Location: [facility] ENDOSCOPY;  Service: Endoscopy;  Laterality: N/A;  . LAPAROSCOPIC APPENDECTOMY N/A 01/06/2015   Procedure: APPENDECTOMY LAPAROSCOPIC;  Surgeon: [name], MD;  Location: [facility] OR[facility];  Service: General;  Laterality: N/A;  . LEFT HEART CATH AND CORONARY ANGIOGRAPHY Left 04/04/2019   Procedure: LEFT HEART CATH AND CORONARY ANGIOGRAPHY;   Surgeon: [name], MD;  Location: [place] CV [facility];  Service: Cardiovascular;  Laterality: Left;  [name] MASTECTOMY    . MASTECTOMY W/ SENTINEL NODE BIOPSY Left 08/26/2018   Procedure: PARTIAL MASTECTOMY WIDE EXCISION WITH SENTINEL LYMPH NODE BIOPSY LEFT;  Surgeon: [name], MD;  Location: [facility] ORS;  Service: General;  Laterality: Left;  . POLYPECTOMY  05/24/2015   Procedure: POLYPECTOMY;  Surgeon: [name], MD;  Location: [place];  Service: Endoscopy;;  . THYROIDECTOMY    . TRIGGER FINGER RELEASE      Home Medications:  Allergies as of 12/03/2019      Reactions   Hydrocodone Other (See Comments)   Unresponsive   Aleve [naproxen Sodium]    Hives & itching    Contrast Media [iodinated Diagnostic Agents]    Pt avoids due to kidney issues   Ioxaglate Other (See Comments)   (iodine) Pt avoids due to kidney issues   Oxycodone Other (See Comments)   hallucinations   Ranexa [ranolazine]    Sick on stomach   Sulfa Antibiotics Other (See Comments)   Pt avoids due to kidney issues   Tramadol Other (See Comments)   nightmares      Medication List       Accurate as of December 03, 2019 11:05 AM. If you have any questions, ask your nurse or doctor.        anastrozole 1 MG tablet Commonly known as: ARIMIDEX Take 1 tablet (1 mg total) by mouth daily.   Blink Tears 0.25 % Soln Generic drug: Polyethylene Glycol 400 Place 1 drop into both eyes 3 (three) times daily as needed (dry/irritated eyes.).   blood glucose meter kit and supplies Kit Dispense based on patient and insurance preference. Use one time daily as directed, E11.9   Calcium 600 + D 600-200 MG-UNIT Tabs Generic drug: Calcium Carb-Cholecalciferol Take 1 tablet by mouth daily.   cetirizine 10 MG tablet Commonly known as: ZYRTEC Take 1 tablet (10 mg total) by mouth daily.   cholecalciferol 25 MCG (1000 UNIT) tablet Commonly known as: VITAMIN D3 Take 1,000 Units by mouth daily.     DULoxetine 60 MG capsule Commonly known as: CYMBALTA Take 1 capsule (60 mg total) by mouth daily.   Eliquis 2.5 MG Tabs tablet Generic drug: apixaban TAKE 1 TABLET BY MOUTH TWICE DAILY   gabapentin 300 MG capsule Commonly known as: NEURONTIN Take 1 capsule (300 mg total) by mouth 3 (three) times daily.   glipiZIDE 5 MG tablet Commonly known as: GLUCOTROL TAKE ONE TABLET BY MOUTH EVERY DAY BEFORE BREAKFAST   isosorbide mononitrate 60 MG 24 hr tablet Commonly known as: IMDUR TAKE ONE TABLET BY MOUTH AT BEDTIME AND HALF A TABLET EVERY MORNING   levothyroxine 150 MCG tablet Commonly known as: SYNTHROID Take 1 tablet (150 mcg total) by mouth daily before breakfast.   losartan 25 MG tablet Commonly known as: COZAAR Take 0.5 tablets (12.5 mg total) by mouth daily.  metoprolol succinate 50 MG 24 hr tablet Commonly known as: TOPROL-XL TAKE ONE TABLET EVERY DAY WITH OR IMMEDIATELY FOLLOWING A MEAL   montelukast 10 MG tablet Commonly known as: SINGULAIR Take 1 tablet (10 mg total) by mouth at bedtime.   Myrbetriq 50 MG Tb24 tablet Generic drug: mirabegron ER TAKE ONE TABLET EVERY DAY   nitroGLYCERIN 0.4 MG SL tablet Commonly known as: NITROSTAT Place 1 tablet (0.4 mg total) under the tongue every 5 (five) minutes as needed for chest pain.   olopatadine 0.1 % ophthalmic solution Commonly known as: Pataday Place 1 drop into both eyes 2 (two) times daily.   pantoprazole 40 MG tablet Commonly known as: PROTONIX Take 1 tablet (40 mg total) by mouth daily as needed.   Pen Needles 3/16" 31G X 5 MM Misc 1 each by Does not apply route daily.   polyethylene glycol 17 g packet Commonly known as: MiraLax Mix-In Pax Take 17 g by mouth daily.   ProAir HFA 108 (90 Base) MCG/ACT inhaler Generic drug: albuterol TAKE 2 PUFFS EVERY 4 HOURS AS NEEDED FORWHEEZING OR SHORTNESS OF BREATH   rosuvastatin 40 MG tablet Commonly known as: CRESTOR Take 1 tablet (40 mg total) by mouth  daily.   Tyler Aas FlexTouch 100 UNIT/ML FlexTouch Pen Generic drug: insulin degludec Inject 0.24 mLs (24 Units total) into the skin daily.   trimethoprim 100 MG tablet Commonly known as: TRIMPEX Take 100 mg by mouth daily.   Trulicity 3 BH/4.1PF Sopn Generic drug: Dulaglutide Inject 0.5 mLs (3 mg total) into the skin once a week.   Uribel 118 MG Caps Take 1 capsule (118 mg total) by mouth 3 (three) times daily as needed (Urinary frequency, urgency, burning).   ZINC PO Take 50 mg by mouth daily.       Allergies:  Allergies  Allergen Reactions  . Hydrocodone Other (See Comments)    Unresponsive  . Aleve [Naproxen Sodium]     Hives & itching   . Contrast Media [Iodinated Diagnostic Agents]     Pt avoids due to kidney issues  . Ioxaglate Other (See Comments)    (iodine) Pt avoids due to kidney issues   . Oxycodone Other (See Comments)    hallucinations  . Ranexa [Ranolazine]     Sick on stomach  . Sulfa Antibiotics Other (See Comments)    Pt avoids due to kidney issues  . Tramadol Other (See Comments)    nightmares    Family History: Family History  Problem Relation Age of Onset  . Hypertension Father   . Heart disease Father   . Heart attack Father   . Diabetes Father   . Cancer Mother        colon  . Heart attack Brother   . Diabetes Brother   . Hypertension Brother   . Heart disease Brother   . Heart attack Brother   . Diabetes Brother   . Hypertension Brother   . Heart disease Brother   . Heart attack Brother   . Diabetes Brother   . Hypertension Brother   . Heart disease Brother   . Cancer Sister        lung  . COPD Sister   . COPD Sister   . Breast cancer Neg Hx     Social History:  reports that she quit smoking about 12 years ago. Her smoking use included cigarettes. She has a 7.50 pack-year smoking history. She has never used smokeless tobacco. She reports that she does  not drink alcohol and does not use drugs.   Physical Exam: BP  117/78   Pulse 85   Ht 5' 5"  (1.651 m)   Wt 211 lb (95.7 kg)   BMI 35.11 kg/m   Constitutional:  Alert and oriented, No acute distress. HEENT: [place] AT, moist mucus membranes.  Trachea midline, no masses. Cardiovascular: No clubbing, cyanosis, or edema. Respiratory: Normal respiratory effort, no increased work of breathing. Neurologic: Grossly intact, no focal deficits, moving all 4 extremities. Psychiatric: Normal mood and affect.    Assessment & Plan:    1. Recurrent UTI  Stable on trimethoprim suppression and d-mannose  2.  Dysuria  Improved on Uribel  3.  Microhematuria  Most likely residual of recurrent UTI  Annual follow-up and call earlier as needed for UTI symptoms or development of gross hematuria   [name], MD  [facility] [address] [place], [place] [address] [phone]

## 2019-12-04 ENCOUNTER — Encounter: Payer: Self-pay | Admitting: Urology

## 2019-12-05 DIAGNOSIS — M1712 Unilateral primary osteoarthritis, left knee: Secondary | ICD-10-CM | POA: Diagnosis not present

## 2019-12-05 DIAGNOSIS — Z471 Aftercare following joint replacement surgery: Secondary | ICD-10-CM | POA: Diagnosis not present

## 2019-12-05 DIAGNOSIS — Z96651 Presence of right artificial knee joint: Secondary | ICD-10-CM | POA: Diagnosis not present

## 2019-12-05 DIAGNOSIS — E119 Type 2 diabetes mellitus without complications: Secondary | ICD-10-CM | POA: Diagnosis not present

## 2019-12-05 DIAGNOSIS — R2681 Unsteadiness on feet: Secondary | ICD-10-CM | POA: Diagnosis not present

## 2019-12-09 DIAGNOSIS — Z96651 Presence of right artificial knee joint: Secondary | ICD-10-CM | POA: Diagnosis not present

## 2019-12-09 DIAGNOSIS — M1712 Unilateral primary osteoarthritis, left knee: Secondary | ICD-10-CM | POA: Diagnosis not present

## 2019-12-09 DIAGNOSIS — R2681 Unsteadiness on feet: Secondary | ICD-10-CM | POA: Diagnosis not present

## 2019-12-09 DIAGNOSIS — Z471 Aftercare following joint replacement surgery: Secondary | ICD-10-CM | POA: Diagnosis not present

## 2019-12-09 DIAGNOSIS — E119 Type 2 diabetes mellitus without complications: Secondary | ICD-10-CM | POA: Diagnosis not present

## 2019-12-12 ENCOUNTER — Ambulatory Visit: Payer: Medicare Other

## 2019-12-15 DIAGNOSIS — E119 Type 2 diabetes mellitus without complications: Secondary | ICD-10-CM | POA: Diagnosis not present

## 2019-12-15 DIAGNOSIS — Z471 Aftercare following joint replacement surgery: Secondary | ICD-10-CM | POA: Diagnosis not present

## 2019-12-15 DIAGNOSIS — M1712 Unilateral primary osteoarthritis, left knee: Secondary | ICD-10-CM | POA: Diagnosis not present

## 2019-12-15 DIAGNOSIS — R2681 Unsteadiness on feet: Secondary | ICD-10-CM | POA: Diagnosis not present

## 2019-12-15 DIAGNOSIS — Z96651 Presence of right artificial knee joint: Secondary | ICD-10-CM | POA: Diagnosis not present

## 2019-12-17 ENCOUNTER — Ambulatory Visit (INDEPENDENT_AMBULATORY_CARE_PROVIDER_SITE_OTHER): Payer: Medicare Other | Admitting: General Practice

## 2019-12-17 ENCOUNTER — Telehealth: Payer: Medicare Other | Admitting: General Practice

## 2019-12-17 DIAGNOSIS — E1122 Type 2 diabetes mellitus with diabetic chronic kidney disease: Secondary | ICD-10-CM | POA: Diagnosis not present

## 2019-12-17 DIAGNOSIS — M1712 Unilateral primary osteoarthritis, left knee: Secondary | ICD-10-CM | POA: Diagnosis not present

## 2019-12-17 DIAGNOSIS — N183 Chronic kidney disease, stage 3 unspecified: Secondary | ICD-10-CM | POA: Diagnosis not present

## 2019-12-17 DIAGNOSIS — I25118 Atherosclerotic heart disease of native coronary artery with other forms of angina pectoris: Secondary | ICD-10-CM

## 2019-12-17 DIAGNOSIS — I129 Hypertensive chronic kidney disease with stage 1 through stage 4 chronic kidney disease, or unspecified chronic kidney disease: Secondary | ICD-10-CM

## 2019-12-17 DIAGNOSIS — Z96651 Presence of right artificial knee joint: Secondary | ICD-10-CM | POA: Diagnosis not present

## 2019-12-17 DIAGNOSIS — R2681 Unsteadiness on feet: Secondary | ICD-10-CM | POA: Diagnosis not present

## 2019-12-17 DIAGNOSIS — E782 Mixed hyperlipidemia: Secondary | ICD-10-CM | POA: Diagnosis not present

## 2019-12-17 DIAGNOSIS — Z794 Long term (current) use of insulin: Secondary | ICD-10-CM | POA: Diagnosis not present

## 2019-12-17 DIAGNOSIS — E119 Type 2 diabetes mellitus without complications: Secondary | ICD-10-CM | POA: Diagnosis not present

## 2019-12-17 DIAGNOSIS — I739 Peripheral vascular disease, unspecified: Secondary | ICD-10-CM

## 2019-12-17 DIAGNOSIS — Z471 Aftercare following joint replacement surgery: Secondary | ICD-10-CM | POA: Diagnosis not present

## 2019-12-17 NOTE — Chronic Care Management (AMB) (Deleted)
Chronic Care Management   Initial Visit Note  12/17/2019 Name: Tammy [name] MRN: [id number] DOB: [birth year]  Referred by: [name], DO Reason for referral : Chronic Care Management (RNCM initial Outreach for chronic Disease Managment and care coordination needs)   Tammy [name] is a 74 y.o. year old female who is a primary care patient of [name], DO. The CCM team was consulted for assistance with chronic disease management and care coordination needs related to CAD, HTN, HLD and Chronic Pain  Review of patient status, including review of consultants reports, relevant laboratory and other test results, and collaboration with appropriate care team members and the patient's provider was performed as part of comprehensive patient evaluation and provision of chronic care management services.    SDOH (Social Determinants of Health) assessments performed: Yes See Care Plan activities for detailed interventions related to SDOH  SDOH Interventions     Most Recent Value  SDOH Interventions  Physical Activity Interventions Other (Comments)  [the patient is post right knee replacement and working with PT in the home.]       Medications: Outpatient Encounter Medications as of 12/17/2019  Medication Sig Note  . anastrozole (ARIMIDEX) 1 MG tablet Take 1 tablet (1 mg total) by mouth daily.   . blood glucose meter kit and supplies KIT Dispense based on patient and insurance preference. Use one time daily as directed, E11.9   . Calcium Carb-Cholecalciferol (CALCIUM 600 + D) 600-200 MG-UNIT TABS Take 1 tablet by mouth daily.    . cetirizine (ZYRTEC) 10 MG tablet Take 1 tablet (10 mg total) by mouth daily.   . cholecalciferol (VITAMIN D3) 25 MCG (1000 UT) tablet Take 1,000 Units by mouth daily.   . Dulaglutide (TRULICITY) 3 ZG/0.1VC SOPN Inject 0.5 mLs (3 mg total) into the skin once a week.   . DULoxetine (CYMBALTA) 60 MG capsule Take 1 capsule (60 mg total) by mouth daily.   [name] ELIQUIS 2.5  MG TABS tablet TAKE 1 TABLET BY MOUTH TWICE DAILY   . gabapentin (NEURONTIN) 300 MG capsule Take 1 capsule (300 mg total) by mouth 3 (three) times daily.   [name] glipiZIDE (GLUCOTROL) 5 MG tablet TAKE ONE TABLET BY MOUTH EVERY DAY BEFORE BREAKFAST   . insulin degludec (TRESIBA FLEXTOUCH) 100 UNIT/ML FlexTouch Pen Inject 0.24 mLs (24 Units total) into the skin daily.   . Insulin Pen Needle (PEN NEEDLES 3/16") 31G X 5 MM MISC 1 each by Does not apply route daily.   . isosorbide mononitrate (IMDUR) 60 MG 24 hr tablet TAKE ONE TABLET BY MOUTH AT BEDTIME AND HALF A TABLET EVERY MORNING   . levothyroxine (SYNTHROID) 150 MCG tablet Take 1 tablet (150 mcg total) by mouth daily before breakfast.   . losartan (COZAAR) 25 MG tablet Take 0.5 tablets (12.5 mg total) by mouth daily. 08/20/2019: Taking 25 mg daily   . Meth-Hyo-M Bl-Na Phos-Ph Sal (URIBEL) 118 MG CAPS Take 1 capsule (118 mg total) by mouth 3 (three) times daily as needed (Urinary frequency, urgency, burning).   . metoprolol succinate (TOPROL-XL) 50 MG 24 hr tablet TAKE ONE TABLET EVERY DAY WITH OR IMMEDIATELY FOLLOWING A MEAL   . montelukast (SINGULAIR) 10 MG tablet Take 1 tablet (10 mg total) by mouth at bedtime.   . Multiple Vitamins-Minerals (ZINC PO) Take 50 mg by mouth daily.    [name] MYRBETRIQ 50 MG TB24 tablet TAKE ONE TABLET EVERY DAY   . nitroGLYCERIN (NITROSTAT) 0.4 MG SL tablet Place  1 tablet (0.4 mg total) under the tongue every 5 (five) minutes as needed for chest pain.   [name] olopatadine (PATADAY) 0.1 % ophthalmic solution Place 1 drop into both eyes 2 (two) times daily.   . pantoprazole (PROTONIX) 40 MG tablet Take 1 tablet (40 mg total) by mouth daily as needed.   . polyethylene glycol (MIRALAX MIX-IN PAX) 17 g packet Take 17 g by mouth daily.   . Polyethylene Glycol 400 (BLINK TEARS) 0.25 % SOLN Place 1 drop into both eyes 3 (three) times daily as needed (dry/irritated eyes.).   [name] PROAIR HFA 108 (90 Base) MCG/ACT inhaler TAKE 2 PUFFS EVERY 4  HOURS AS NEEDED FORWHEEZING OR SHORTNESS OF BREATH   . rosuvastatin (CRESTOR) 40 MG tablet Take 1 tablet (40 mg total) by mouth daily.   [name] trimethoprim (TRIMPEX) 100 MG tablet Take 100 mg by mouth daily.    No facility-administered encounter medications on file as of 12/17/2019.     Objective:  BP Readings from Last 3 Encounters:  12/03/19 117/78  11/11/19 107/74  10/15/19 138/66    Goals Addressed            This Visit's Progress   . Cope with Chronic Pain      . RNCM: Manage Pain       Follow Up Date 02/10/2020   - develop a personal pain management plan - plan exercise or activity when pain is best controlled - prioritize tasks for the day - track times pain is worst and when it is best - track what makes the pain worse and what makes it better    Why is this important?   Day-to-day life can be hard when you have chronic pain.  Pain medicine is just one piece of the treatment puzzle.  You can try these action steps to help you manage your pain.    Notes: Currently working with PT    . RNCM: Monitor and Manage My Blood Sugar       Follow Up Date 02/10/2020   - check blood sugar at prescribed times - check blood sugar if I feel it is too high or too low - take the blood sugar log to all doctor visits    Why is this important?   Checking your blood sugar at home helps to keep it from getting very high or very low.  Writing the results in a diary or log helps the doctor know how to care for you.  Your blood sugar log should have the time, date and the results.  Also, write down the amount of insulin or other medicine that you take.  Other information, like what you ate, exercise done and how you were feeling, will also be helpful.     Notes: Is compliant with blood sugar checks. Current readings 100-120    . RNCM: Set My Target A1C       Follow Up Date 02/10/2020   - set target A1C    Why is this important?   Your target A1C is decided together by you and your  doctor.  It is based on several things like your age and other health issues.    Notes: the patients current hemoglobin A1C is 7.7 on 11-11-2019    . RNCM: Track and Manage My Blood Pressure       Follow Up Date 02/10/2020   - check blood pressure 3 times per week - write blood pressure results in a log or diary  Why is this important?   You won't feel high blood pressure, but it can still hurt your blood vessels.  High blood pressure can cause heart or kidney problems. It can also cause a stroke.  Making lifestyle changes like losing a little weight or eating less salt will help.  Checking your blood pressure at home and at different times of the day can help to control blood pressure.  If the doctor prescribes medicine remember to take it the way the doctor ordered.  Call the office if you cannot afford the medicine or if there are questions about it.     Notes: The patient is compliant with blood pressure management at this time.        Tammy [name] was given information about Chronic Care Management services today including:  1. CCM service includes personalized support from designated clinical staff supervised by her physician, including individualized plan of care and coordination with other care providers 2. 24/7 contact phone numbers for assistance for urgent and routine care needs. 3. Service will only be billed when office clinical staff spend 20 minutes or more in a month to coordinate care. 4. Only one practitioner may furnish and bill the service in a calendar month. 5. The patient may stop CCM services at any time (effective at the end of the month) by phone call to the office staff. 6. The patient will be responsible for cost sharing (co-pay) of up to 20% of the service fee (after annual deductible is met).  Patient agreed to services and verbal consent obtained.   Plan:   Face to Face appointment with care management team member scheduled for:  02-10-2020 at 2:45  pm  [place], MSN, [facility] Mobile: [phone]

## 2019-12-17 NOTE — Patient Instructions (Signed)
Visit Information  Goals Addressed            This Visit's Progress   . Cope with Chronic Pain       Follow Up Date 02/10/2020   - learn relaxation techniques - practice acceptance of chronic pain - tell myself I can (not I can't)    Why is this important?   Stress makes chronic pain feel worse.  Feelings like depression, anxiety, stress and anger can make your body more sensitive to pain.  Learning ways to cope with stress or depression may help you find some relief from the pain.     Notes: Is working with PT     . RNCM: Manage Pain       Follow Up Date 02/10/2020   - develop a personal pain management plan - plan exercise or activity when pain is best controlled - prioritize tasks for the day - track times pain is worst and when it is best - track what makes the pain worse and what makes it better    Why is this important?   Day-to-day life can be hard when you have chronic pain.  Pain medicine is just one piece of the treatment puzzle.  You can try these action steps to help you manage your pain.    Notes: Currently working with PT    . RNCM: Monitor and Manage My Blood Sugar       Follow Up Date 02/10/2020   - check blood sugar at prescribed times - check blood sugar if I feel it is too high or too low - take the blood sugar log to all doctor visits    Why is this important?   Checking your blood sugar at home helps to keep it from getting very high or very low.  Writing the results in a diary or log helps the doctor know how to care for you.  Your blood sugar log should have the time, date and the results.  Also, write down the amount of insulin or other medicine that you take.  Other information, like what you ate, exercise done and how you were feeling, will also be helpful.     Notes: Is compliant with blood sugar checks. Current readings 100-120    . RNCM: Set My Target A1C       Follow Up Date 02/10/2020   - set target A1C    Why is this  important?   Your target A1C is decided together by you and your doctor.  It is based on several things like your age and other health issues.    Notes: the patients current hemoglobin A1C is 7.7 on 11-11-2019    . RNCM: Track and Manage My Blood Pressure       Follow Up Date 02/10/2020   - check blood pressure 3 times per week - write blood pressure results in a log or diary    Why is this important?   You won't feel high blood pressure, but it can still hurt your blood vessels.  High blood pressure can cause heart or kidney problems. It can also cause a stroke.  Making lifestyle changes like losing a little weight or eating less salt will help.  Checking your blood pressure at home and at different times of the day can help to control blood pressure.  If the doctor prescribes medicine remember to take it the way the doctor ordered.  Call the office if you cannot afford the  medicine or if there are questions about it.     Notes: The patient is compliant with blood pressure management at this time.       Ms. Haydon was given information about Chronic Care Management services today including:  1. CCM service includes personalized support from designated clinical staff supervised by her physician, including individualized plan of care and coordination with other care providers 2. 24/7 contact phone numbers for assistance for urgent and routine care needs. 3. Service will only be billed when office clinical staff spend 20 minutes or more in a month to coordinate care. 4. Only one practitioner may furnish and bill the service in a calendar month. 5. The patient may stop CCM services at any time (effective at the end of the month) by phone call to the office staff. 6. The patient will be responsible for cost sharing (co-pay) of up to 20% of the service fee (after annual deductible is met).  Patient agreed to services and verbal consent obtained.   Patient verbalizes understanding of  instructions provided today.   Face to Face appointment with care management team member scheduled for:  02-10-2020 at 245pm  [place], MSN, [facility] Mobile: [phone]

## 2019-12-17 NOTE — Chronic Care Management (AMB) (Signed)
Chronic Care Management   Initial Visit Note  12/17/2019 Name: Tammy [name] MRN: [id number] DOB: [birth year]  Referred by: [name], DO Reason for referral : Chronic Care Management (RNCM initial Outreach for chronic Disease Managment and care coordination needs)   Tammy [name] is a 74 y.o. year old female who is a primary care patient of [name], DO. The CCM team was consulted for assistance with chronic disease management and care coordination needs related to CAD, HTN, HLD and Chronic Pain  Review of patient status, including review of consultants reports, relevant laboratory and other test results, and collaboration with appropriate care team members and the patient's provider was performed as part of comprehensive patient evaluation and provision of chronic care management services.    SDOH (Social Determinants of Health) assessments performed: Yes See Care Plan activities for detailed interventions related to SDOH  SDOH Interventions     Most Recent Value  SDOH Interventions  Physical Activity Interventions Other (Comments)  [the patient is post right knee replacement and working with PT in the home.]     Medications: Outpatient Encounter Medications as of 12/17/2019  Medication Sig Note   anastrozole (ARIMIDEX) 1 MG tablet Take 1 tablet (1 mg total) by mouth daily.    blood glucose meter kit and supplies KIT Dispense based on patient and insurance preference. Use one time daily as directed, E11.9    Calcium Carb-Cholecalciferol (CALCIUM 600 + D) 600-200 MG-UNIT TABS Take 1 tablet by mouth daily.     cetirizine (ZYRTEC) 10 MG tablet Take 1 tablet (10 mg total) by mouth daily.    cholecalciferol (VITAMIN D3) 25 MCG (1000 UT) tablet Take 1,000 Units by mouth daily.    Dulaglutide (TRULICITY) 3 VQ/0.0QQ SOPN Inject 0.5 mLs (3 mg total) into the skin once a week.    DULoxetine (CYMBALTA) 60 MG capsule Take 1 capsule (60 mg total) by mouth daily.    ELIQUIS 2.5 MG  TABS tablet TAKE 1 TABLET BY MOUTH TWICE DAILY    gabapentin (NEURONTIN) 300 MG capsule Take 1 capsule (300 mg total) by mouth 3 (three) times daily.    glipiZIDE (GLUCOTROL) 5 MG tablet TAKE ONE TABLET BY MOUTH EVERY DAY BEFORE BREAKFAST    insulin degludec (TRESIBA FLEXTOUCH) 100 UNIT/ML FlexTouch Pen Inject 0.24 mLs (24 Units total) into the skin daily.    Insulin Pen Needle (PEN NEEDLES 3/16") 31G X 5 MM MISC 1 each by Does not apply route daily.    isosorbide mononitrate (IMDUR) 60 MG 24 hr tablet TAKE ONE TABLET BY MOUTH AT BEDTIME AND HALF A TABLET EVERY MORNING    levothyroxine (SYNTHROID) 150 MCG tablet Take 1 tablet (150 mcg total) by mouth daily before breakfast.    losartan (COZAAR) 25 MG tablet Take 0.5 tablets (12.5 mg total) by mouth daily. 08/20/2019: Taking 25 mg daily    Meth-Hyo-M Bl-Na Phos-Ph Sal (URIBEL) 118 MG CAPS Take 1 capsule (118 mg total) by mouth 3 (three) times daily as needed (Urinary frequency, urgency, burning).    metoprolol succinate (TOPROL-XL) 50 MG 24 hr tablet TAKE ONE TABLET EVERY DAY WITH OR IMMEDIATELY FOLLOWING A MEAL    montelukast (SINGULAIR) 10 MG tablet Take 1 tablet (10 mg total) by mouth at bedtime.    Multiple Vitamins-Minerals (ZINC PO) Take 50 mg by mouth daily.     MYRBETRIQ 50 MG TB24 tablet TAKE ONE TABLET EVERY DAY    nitroGLYCERIN (NITROSTAT) 0.4 MG SL tablet Place 1 tablet (  0.4 mg total) under the tongue every 5 (five) minutes as needed for chest pain.    olopatadine (PATADAY) 0.1 % ophthalmic solution Place 1 drop into both eyes 2 (two) times daily.    pantoprazole (PROTONIX) 40 MG tablet Take 1 tablet (40 mg total) by mouth daily as needed.    polyethylene glycol (MIRALAX MIX-IN PAX) 17 g packet Take 17 g by mouth daily.    Polyethylene Glycol 400 (BLINK TEARS) 0.25 % SOLN Place 1 drop into both eyes 3 (three) times daily as needed (dry/irritated eyes.).    PROAIR HFA 108 (90 Base) MCG/ACT inhaler TAKE 2 PUFFS EVERY 4  HOURS AS NEEDED FORWHEEZING OR SHORTNESS OF BREATH    rosuvastatin (CRESTOR) 40 MG tablet Take 1 tablet (40 mg total) by mouth daily.    trimethoprim (TRIMPEX) 100 MG tablet Take 100 mg by mouth daily.    No facility-administered encounter medications on file as of 12/17/2019.     Objective:  BP Readings from Last 3 Encounters:  12/03/19 117/78  11/11/19 107/74  10/15/19 138/66   Lab Results  Component Value Date   HGBA1C 7.7 (H) 11/11/2019    Goals Addressed            This Visit's Progress    Cope with Chronic Pain       Follow Up Date 02/10/2020   - learn relaxation techniques - practice acceptance of chronic pain - tell myself I can (not I can't)    Why is this important?   Stress makes chronic pain feel worse.  Feelings like depression, anxiety, stress and anger can make your body more sensitive to pain.  Learning ways to cope with stress or depression may help you find some relief from the pain.     Notes: Is working with PT      RNCM: Manage Pain       Follow Up Date 02/10/2020   - develop a personal pain management plan - plan exercise or activity when pain is best controlled - prioritize tasks for the day - track times pain is worst and when it is best - track what makes the pain worse and what makes it better    Why is this important?   Day-to-day life can be hard when you have chronic pain.  Pain medicine is just one piece of the treatment puzzle.  You can try these action steps to help you manage your pain.    Notes: Currently working with PT     RNCM: Monitor and Manage My Blood Sugar       Follow Up Date 02/10/2020   - check blood sugar at prescribed times - check blood sugar if I feel it is too high or too low - take the blood sugar log to all doctor visits    Why is this important?   Checking your blood sugar at home helps to keep it from getting very high or very low.  Writing the results in a diary or log helps the doctor know how  to care for you.  Your blood sugar log should have the time, date and the results.  Also, write down the amount of insulin or other medicine that you take.  Other information, like what you ate, exercise done and how you were feeling, will also be helpful.     Notes: Is compliant with blood sugar checks. Current readings 100-120     RNCM: Set My Target A1C  Follow Up Date 02/10/2020   - set target A1C    Why is this important?   Your target A1C is decided together by you and your doctor.  It is based on several things like your age and other health issues.    Notes: the patients current hemoglobin A1C is 7.7 on 11-11-2019     RNCM: Track and Manage My Blood Pressure       Follow Up Date 02/10/2020   - check blood pressure 3 times per week - write blood pressure results in a log or diary    Why is this important?   You won't feel high blood pressure, but it can still hurt your blood vessels.  High blood pressure can cause heart or kidney problems. It can also cause a stroke.  Making lifestyle changes like losing a little weight or eating less salt will help.  Checking your blood pressure at home and at different times of the day can help to control blood pressure.  If the doctor prescribes medicine remember to take it the way the doctor ordered.  Call the office if you cannot afford the medicine or if there are questions about it.     Notes: The patient is compliant with blood pressure management at this time.        Ms. Fernholz was given information about Chronic Care Management services today including:  1. CCM service includes personalized support from designated clinical staff supervised by her physician, including individualized plan of care and coordination with other care providers 2. 24/7 contact phone numbers for assistance for urgent and routine care needs. 3. Service will only be billed when office clinical staff spend 20 minutes or more in a month to coordinate  care. 4. Only one practitioner may furnish and bill the service in a calendar month. 5. The patient may stop CCM services at any time (effective at the end of the month) by phone call to the office staff. 6. The patient will be responsible for cost sharing (co-pay) of up to 20% of the service fee (after annual deductible is met).  Patient agreed to services and verbal consent obtained.   Plan:   Face to Face appointment with care management team member scheduled for: 02-10-2020 at 2:45 pm  [place], MSN, [facility] Mobile: [phone]

## 2019-12-19 ENCOUNTER — Ambulatory Visit (INDEPENDENT_AMBULATORY_CARE_PROVIDER_SITE_OTHER): Payer: Self-pay

## 2019-12-19 VITALS — Ht 66.0 in | Wt 210.0 lb

## 2019-12-19 DIAGNOSIS — Z471 Aftercare following joint replacement surgery: Secondary | ICD-10-CM | POA: Diagnosis not present

## 2019-12-19 DIAGNOSIS — R2681 Unsteadiness on feet: Secondary | ICD-10-CM | POA: Diagnosis not present

## 2019-12-19 DIAGNOSIS — E119 Type 2 diabetes mellitus without complications: Secondary | ICD-10-CM | POA: Diagnosis not present

## 2019-12-19 DIAGNOSIS — Z96651 Presence of right artificial knee joint: Secondary | ICD-10-CM | POA: Diagnosis not present

## 2019-12-19 DIAGNOSIS — M1712 Unilateral primary osteoarthritis, left knee: Secondary | ICD-10-CM | POA: Diagnosis not present

## 2019-12-19 DIAGNOSIS — Z0000 Encounter for general adult medical examination without abnormal findings: Secondary | ICD-10-CM

## 2019-12-19 NOTE — Patient Instructions (Signed)
Tammy [name] , Thank you for taking time to come for your Medicare Wellness Visit. I appreciate your ongoing commitment to your health goals. Please review the following plan we discussed and let me know if I can assist you in the future.   Screening recommendations/referrals: Colonoscopy: completed 01/28/2019 Mammogram: completed 07/15/2019 Bone Density: completed 05/26/2019 Recommended yearly ophthalmology/optometry visit for glaucoma screening and checkup Recommended yearly dental visit for hygiene and checkup  Vaccinations: Influenza vaccine: due Pneumococcal vaccine: completed 07/08/2014 Tdap vaccine: decline Shingles vaccine: discussed   Covid-19: 05/29/2019, 05/08/2019  Advanced directives: Advance directive discussed with you today.    Conditions/risks identified: none  Next appointment: Follow up in one year for your annual wellness visit    Preventive Care 65 Years and Older, Female Preventive care refers to lifestyle choices and visits with your health care provider that can promote health and wellness. What does preventive care include?  A yearly physical exam. This is also called an annual well check.  Dental exams once or twice a year.  Routine eye exams. Ask your health care provider how often you should have your eyes checked.  Personal lifestyle choices, including:  Daily care of your teeth and gums.  Regular physical activity.  Eating a healthy diet.  Avoiding tobacco and drug use.  Limiting alcohol use.  Practicing safe sex.  Taking low-dose aspirin every day.  Taking vitamin and mineral supplements as recommended by your health care provider. What happens during an annual well check? The services and screenings done by your health care provider during your annual well check will depend on your age, overall health, lifestyle risk factors, and family history of disease. Counseling  Your health care provider may ask you questions about your:  Alcohol  use.  Tobacco use.  Drug use.  Emotional well-being.  Home and relationship well-being.  Sexual activity.  Eating habits.  History of falls.  Memory and ability to understand (cognition).  Work and work Statistician.  Reproductive health. Screening  You may have the following tests or measurements:  Height, weight, and BMI.  Blood pressure.  Lipid and cholesterol levels. These may be checked every 5 years, or more frequently if you are over 62 years old.  Skin check.  Lung cancer screening. You may have this screening every year starting at age 76 if you have a 30-pack-year history of smoking and currently smoke or have quit within the past 15 years.  Fecal occult blood test (FOBT) of the stool. You may have this test every year starting at age 67.  Flexible sigmoidoscopy or colonoscopy. You may have a sigmoidoscopy every 5 years or a colonoscopy every 10 years starting at age 32.  Hepatitis C blood test.  Hepatitis B blood test.  Sexually transmitted disease (STD) testing.  Diabetes screening. This is done by checking your blood sugar (glucose) after you have not eaten for a while (fasting). You may have this done every 1-3 years.  Bone density scan. This is done to screen for osteoporosis. You may have this done starting at age 59.  Mammogram. This may be done every 1-2 years. Talk to your health care provider about how often you should have regular mammograms. Talk with your health care provider about your test results, treatment options, and if necessary, the need for more tests. Vaccines  Your health care provider may recommend certain vaccines, such as:  Influenza vaccine. This is recommended every year.  Tetanus, diphtheria, and acellular pertussis (Tdap, Td) vaccine. You may  need a Td booster every 10 years.  Zoster vaccine. You may need this after age 16.  Pneumococcal 13-valent conjugate (PCV13) vaccine. One dose is recommended after age  36.  Pneumococcal polysaccharide (PPSV23) vaccine. One dose is recommended after age 78. Talk to your health care provider about which screenings and vaccines you need and how often you need them. This information is not intended to replace advice given to you by your health care provider. Make sure you discuss any questions you have with your health care provider. Document Released: 03/26/2015 Document Revised: 11/17/2015 Document Reviewed: 12/29/2014 Elsevier Interactive Patient Education  2017 [place] Prevention in the Home Falls can cause injuries. They can happen to people of all ages. There are many things you can do to make your home safe and to help prevent falls. What can I do on the outside of my home?  Regularly fix the edges of walkways and driveways and fix any cracks.  Remove anything that might make you trip as you walk through a door, such as a raised step or threshold.  Trim any bushes or trees on the path to your home.  Use bright outdoor lighting.  Clear any walking paths of anything that might make someone trip, such as rocks or tools.  Regularly check to see if handrails are loose or broken. Make sure that both sides of any steps have handrails.  Any raised decks and porches should have guardrails on the edges.  Have any leaves, snow, or ice cleared regularly.  Use sand or salt on walking paths during winter.  Clean up any spills in your garage right away. This includes oil or grease spills. What can I do in the bathroom?  Use night lights.  Install grab bars by the toilet and in the tub and shower. Do not use towel bars as grab bars.  Use non-skid mats or decals in the tub or shower.  If you need to sit down in the shower, use a plastic, non-slip stool.  Keep the floor dry. Clean up any water that spills on the floor as soon as it happens.  Remove soap buildup in the tub or shower regularly.  Attach bath mats securely with double-sided  non-slip rug tape.  Do not have throw rugs and other things on the floor that can make you trip. What can I do in the bedroom?  Use night lights.  Make sure that you have a light by your bed that is easy to reach.  Do not use any sheets or blankets that are too big for your bed. They should not hang down onto the floor.  Have a firm chair that has side arms. You can use this for support while you get dressed.  Do not have throw rugs and other things on the floor that can make you trip. What can I do in the kitchen?  Clean up any spills right away.  Avoid walking on wet floors.  Keep items that you use a lot in easy-to-reach places.  If you need to reach something above you, use a strong step stool that has a grab bar.  Keep electrical cords out of the way.  Do not use floor polish or wax that makes floors slippery. If you must use wax, use non-skid floor wax.  Do not have throw rugs and other things on the floor that can make you trip. What can I do with my stairs?  Do not leave any items on  the stairs.  Make sure that there are handrails on both sides of the stairs and use them. Fix handrails that are broken or loose. Make sure that handrails are as long as the stairways.  Check any carpeting to make sure that it is firmly attached to the stairs. Fix any carpet that is loose or worn.  Avoid having throw rugs at the top or bottom of the stairs. If you do have throw rugs, attach them to the floor with carpet tape.  Make sure that you have a light switch at the top of the stairs and the bottom of the stairs. If you do not have them, ask someone to add them for you. What else can I do to help prevent falls?  Wear shoes that:  Do not have high heels.  Have rubber bottoms.  Are comfortable and fit you well.  Are closed at the toe. Do not wear sandals.  If you use a stepladder:  Make sure that it is fully opened. Do not climb a closed stepladder.  Make sure that both  sides of the stepladder are locked into place.  Ask someone to hold it for you, if possible.  Clearly mark and make sure that you can see:  Any grab bars or handrails.  First and last steps.  Where the edge of each step is.  Use tools that help you move around (mobility aids) if they are needed. These include:  Canes.  Walkers.  Scooters.  Crutches.  Turn on the lights when you go into a dark area. Replace any light bulbs as soon as they burn out.  Set up your furniture so you have a clear path. Avoid moving your furniture around.  If any of your floors are uneven, fix them.  If there are any pets around you, be aware of where they are.  Review your medicines with your doctor. Some medicines can make you feel dizzy. This can increase your chance of falling. Ask your doctor what other things that you can do to help prevent falls. This information is not intended to replace advice given to you by your health care provider. Make sure you discuss any questions you have with your health care provider. Document Released: 12/24/2008 Document Revised: 08/05/2015 Document Reviewed: 04/03/2014 Elsevier Interactive Patient Education  2017 [employer].

## 2019-12-19 NOTE — Progress Notes (Signed)
I connected with Tammy [name] today by telephone and verified that I am speaking with the correct person using two identifiers. Location patient: home Location provider: work Persons participating in the virtual visit: Kynzi, Levay LPN.   I discussed the limitations, risks, security and privacy concerns of performing an evaluation and management service by telephone and the availability of in person appointments. I also discussed with the patient that there may be a patient responsible charge related to this service. The patient expressed understanding and verbally consented to this telephonic visit.    Interactive audio and video telecommunications were attempted between this provider and patient, however failed, due to patient having technical difficulties OR patient did not have access to video capability.  We continued and completed visit with audio only.     Vital signs may be patient reported or missing.  Subjective:   Tammy [name] is a 74 y.o. female who presents for Medicare Annual (Subsequent) preventive examination.  Review of Systems     Cardiac Risk Factors include: advanced age (>13mn, >>67women);diabetes mellitus;obesity (BMI >30kg/m2)     Objective:    Today's Vitals   12/19/19 1426  Weight: 210 lb (95.3 kg)  Height: _0  (1.676 m)   Body mass index is 33.89 kg/m.  Advanced Directives 12/19/2019 07/17/2019 07/15/2019 06/18/2019 04/04/2019 02/03/2019 01/28/2019  Does Patient Have a Medical Advance Directive? _1  No No  Would patient like information on creating a medical advance directive? - - - No - Patient declined No - Patient declined No - Patient declined -    Current Medications (verified) Outpatient Encounter Medications as of 12/19/2019  Medication Sig  . anastrozole (ARIMIDEX) 1 MG tablet Take 1 tablet (1 mg total) by mouth daily.  . blood glucose meter kit and supplies KIT Dispense based on patient and insurance preference. Use one time  daily as directed, E11.9  . Calcium Carb-Cholecalciferol (CALCIUM 600 + D) 600-200 MG-UNIT TABS Take 1 tablet by mouth daily.   . cetirizine (ZYRTEC) 10 MG tablet Take 1 tablet (10 mg total) by mouth daily.  . cholecalciferol (VITAMIN D3) 25 MCG (1000 UT) tablet Take 1,000 Units by mouth daily.  . Dulaglutide (TRULICITY) 3 MQM/5.7QISOPN Inject 0.5 mLs (3 mg total) into the skin once a week.  .Marland KitchenELIQUIS 2.5 MG TABS tablet TAKE 1 TABLET BY MOUTH TWICE DAILY  . gabapentin (NEURONTIN) 300 MG capsule Take 1 capsule (300 mg total) by mouth 3 (three) times daily.  .Marland KitchenglipiZIDE (GLUCOTROL) 5 MG tablet TAKE ONE TABLET BY MOUTH EVERY DAY BEFORE BREAKFAST  . insulin degludec (TRESIBA FLEXTOUCH) 100 UNIT/ML FlexTouch Pen Inject 0.24 mLs (24 Units total) into the skin daily.  . Insulin Pen Needle (PEN NEEDLES 3/16") 31G X 5 MM MISC 1 each by Does not apply route daily.  . isosorbide mononitrate (IMDUR) 60 MG 24 hr tablet TAKE ONE TABLET BY MOUTH AT BEDTIME AND HALF A TABLET EVERY MORNING  . levothyroxine (SYNTHROID) 150 MCG tablet Take 1 tablet (150 mcg total) by mouth daily before breakfast.  . losartan (COZAAR) 25 MG tablet Take 0.5 tablets (12.5 mg total) by mouth daily.  . Meth-Hyo-M Bl-Na Phos-Ph Sal (URIBEL) 118 MG CAPS Take 1 capsule (118 mg total) by mouth 3 (three) times daily as needed (Urinary frequency, urgency, burning).  . metoprolol succinate (TOPROL-XL) 50 MG 24 hr tablet TAKE ONE TABLET EVERY DAY WITH OR IMMEDIATELY FOLLOWING A MEAL  . montelukast (SINGULAIR) 10 MG tablet  Take 1 tablet (10 mg total) by mouth at bedtime.  . Multiple Vitamins-Minerals (ZINC PO) Take 50 mg by mouth daily.   [name] MYRBETRIQ 50 MG TB24 tablet TAKE ONE TABLET EVERY DAY  . nitroGLYCERIN (NITROSTAT) 0.4 MG SL tablet Place 1 tablet (0.4 mg total) under the tongue every 5 (five) minutes as needed for chest pain.  . pantoprazole (PROTONIX) 40 MG tablet Take 1 tablet (40 mg total) by mouth daily as needed.  [name] PROAIR HFA 108  (90 Base) MCG/ACT inhaler TAKE 2 PUFFS EVERY 4 HOURS AS NEEDED FORWHEEZING OR SHORTNESS OF BREATH  . rosuvastatin (CRESTOR) 40 MG tablet Take 1 tablet (40 mg total) by mouth daily.  [name] trimethoprim (TRIMPEX) 100 MG tablet Take 100 mg by mouth daily.  . DULoxetine (CYMBALTA) 60 MG capsule Take 1 capsule (60 mg total) by mouth daily. (Patient not taking: Reported on 12/19/2019)  . olopatadine (PATADAY) 0.1 % ophthalmic solution Place 1 drop into both eyes 2 (two) times daily. (Patient not taking: Reported on 12/19/2019)  . polyethylene glycol (MIRALAX MIX-IN PAX) 17 g packet Take 17 g by mouth daily. (Patient not taking: Reported on 12/19/2019)  . Polyethylene Glycol 400 (BLINK TEARS) 0.25 % SOLN Place 1 drop into both eyes 3 (three) times daily as needed (dry/irritated eyes.). (Patient not taking: Reported on 12/19/2019)   No facility-administered encounter medications on file as of 12/19/2019.    Allergies (verified) Hydrocodone, Aleve [naproxen sodium], Contrast media [iodinated diagnostic agents], Ioxaglate, Oxycodone, Ranexa [ranolazine], Sulfa antibiotics, and Tramadol   History: Past Medical History:  Diagnosis Date  . Asthma   . Benign neoplasm of colon   . Breast cancer ([place]) 08/2018   left breast  . Cervical disc disease    "bulging" - no limitations per pt  . Chronic diastolic CHF (congestive heart failure) ([place])    a. 07/2012 Echo: Nl EF. mild inferoseptal HK, Gr 2 DD, mild conc LVH, mild PR/TR, mild PAH.  . CKD (chronic kidney disease), stage III ([place])   . COPD (chronic obstructive pulmonary disease) ([place])   . Coronary artery disease    a. 11/2011 Cath: LAD 72md, LCX min irregs, RCA 70p, 1080mith L->R collats, EF 60%-->Med Rx.  . Diabetes mellitus without complication ([facility]  . Fusion of lumbar spine 12/22/2015  . GERD (gastroesophageal reflux disease)   . History of DVT (deep vein thrombosis)   . History of tobacco abuse    a. Quit 2011.  . Marland Kitchenyperlipidemia   . Hypertension     . Hypothyroidism   . Obesity   . Palpitations   . PE (pulmonary embolism) 10/15  . Personal history of radiation therapy   . PONV (postoperative nausea and vomiting)   . [facility]monary [facility]us ([facility]   R[facility]. Presented with SOB and elevated d-dimer.   . Marland KitchenVD (peripheral vascular disease) ([facility]   a. PTA of right leg and left femoral artery with stenosis.  . Renal insufficiency   [facility]   TIAs - no deficits   Past Surgical History:  Procedure Laterality Date  . ANGIOPLASTY / STENTING FEMORAL     PVD; angioplasty right leg and left femoral artery with stenosis.   . APPENDECTOMY  Oct. 2016  . BACK SURGERY  03/2012   nerve stimulator inserted  . BACK SURGERY     screws, rods replaced with new hardware.  . Marland KitchenACK SURGERY  11/2016  . BREAST LUMPECTOMY Left 08/26/2018  . BREAST SURGERY    .  CARDIAC CATHETERIZATION  12/07/2011   Mid LAD 50%, distal LAD 50%, mid RCA 70%, distal RCA 100%   . CARDIAC CATHETERIZATION  01/04/2011   100% occluded mid RCA with good collaterals from distal LAD, normal LVEF.  [name] CHOLECYSTECTOMY    . COLONOSCOPY  2013  . COLONOSCOPY WITH PROPOFOL N/A 05/24/2015   Procedure: COLONOSCOPY WITH PROPOFOL;  Surgeon: [name], MD;  Location: [facility];  Service: Endoscopy;  Laterality: N/A;  Diabetic - oral meds  . COLONOSCOPY WITH PROPOFOL N/A 01/28/2019   Procedure: COLONOSCOPY WITH PROPOFOL;  Surgeon: [name], MD;  Location: [facility];  Service: Endoscopy;  Laterality: N/A;  . DIAGNOSTIC LAPAROSCOPY    . ESOPHAGOGASTRODUODENOSCOPY (EGD) WITH PROPOFOL N/A 01/28/2019   Procedure: ESOPHAGOGASTRODUODENOSCOPY (EGD) WITH PROPOFOL;  Surgeon: [name], MD;  Location: [facility] END[facility]COPY;  Service: Endoscopy;  Laterality: N/A;  . LAPAROSCOPIC APPENDECTOMY N/A 01/06/2015   Procedure: APPENDECTOMY LAPAROSCOPIC;  Surgeon: [name], MD;  Location: [facility];  Service: General;  Laterality: N/A;  . LEFT HEART CATH  AND CORONARY ANGIOGRAPHY Left 04/04/2019   Procedure: LEFT HEART CATH AND CORONARY ANGIOGRAPHY;  Surgeon: [name], MD;  Location: [facility];  Service: Cardiovascular;  Laterality: Left;  [name] MASTECTOMY    . MASTECTOMY W/ SENTINEL NODE BIOPSY Left 08/26/2018   Procedure: PARTIAL MASTECTOMY WIDE EXCISION WITH SENTINEL LYMPH NODE BIOPSY LEFT;  Surgeon: [name], MD;  Location: [facility];  Service: General;  Laterality: Left;  . POLYPECTOMY  05/24/2015   Procedure: POLYPECTOMY;  Surgeon: [name], MD;  Location: [facility];  Service: Endoscopy;;  . THYROIDECTOMY    . TOTAL KNEE ARTHROPLASTY Right 11/13/2019  . TRIGGER FINGER RELEASE     Family History  Problem Relation Age of Onset  . Hypertension Father   . Heart disease Father   . Heart attack Father   . Diabetes Father   . Cancer Mother        colon  . Heart attack Brother   . Diabetes Brother   . Hypertension Brother   . Heart disease Brother   . Heart attack Brother   . Diabetes Brother   . Hypertension Brother   . Heart disease Brother   . Heart attack Brother   . Diabetes Brother   . Hypertension Brother   . Heart disease Brother   . Cancer Sister        lung  . COPD Sister   . COPD Sister   . Breast cancer Neg Hx    Social History   Socioeconomic History  . Marital status: Married    Spouse name: Not on file  . Number of children: 2  . Years of education: some college, Production assistant, radio   . Highest education level: Associate degree: occupational, Hotel manager, or vocational program  Occupational History  . Occupation: Retired  Tobacco Use  . Smoking status: Former Smoker    Packs/day: 0.50    Years: 15.00    Pack years: 7.50    Types: Cigarettes    Quit date: 03/14/2007    Years since quitting: 12.7  . Smokeless tobacco: Never Used  Vaping Use  . Vaping Use: Never used  Substance and Sexual Activity  . Alcohol use: No    Alcohol/week: 0.0 standard  drinks  . Drug use: Never  . Sexual activity: Yes    Birth control/protection: Post-menopausal  Other Topics Concern  . Not on file  Social History  Narrative   Lives at home with husband in [place]. Quit smoking 10 years ago; never alcohol. 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    Social Determinants of Health   Financial Resource Strain: Low Risk   . Difficulty of Paying Living Expenses: Not hard at all  Food Insecurity: No Food Insecurity  . Worried About Charity fundraiser in the Last Year: Never true  . Ran Out of Food in the Last Year: Never true  Transportation Needs: No Transportation Needs  . Lack of Transportation (Medical): No  . Lack of Transportation (Non-Medical): No  Physical Activity: Sufficiently Active  . Days of Exercise per Week: 7 days  . Minutes of Exercise per Session: 60 min  Stress: No Stress Concern Present  . Feeling of Stress : Not at all  Social Connections: Socially Integrated  . Frequency of Communication with Friends and Family: More than three times a week  . Frequency of Social Gatherings with Friends and Family: More than three times a week  . Attends Religious Services: More than 4 times per year  . Active Member of Clubs or Organizations: Yes  . Attends Archivist Meetings: 1 to 4 times per year  . Marital Status: Married    Tobacco Counseling Counseling given: Not Answered   Clinical Intake:  Pre-visit preparation completed: Yes  Pain : No/denies pain     Nutritional Status: BMI > 30  Obese Nutritional Risks: None Diabetes: Yes  How often do you need to have someone help you when you read instructions, pamphlets, or other written materials from your doctor or pharmacy?: 1 - Never What is the last grade level you completed in school?: 12th grade  Diabetic? Yes Nutrition Risk Assessment:  Has the patient had any N/V/D within the last 2 months?  No  Does the patient have any  non-healing wounds?  No  Has the patient had any unintentional weight loss or weight gain?  No   Diabetes:  Is the patient diabetic?  Yes  If diabetic, was a CBG obtained today?  No  Did the patient bring in their glucometer from home?  No  How often do you monitor your CBG's? daily.   Financial Strains and Diabetes Management:  Are you having any financial strains with the device, your supplies or your medication? No .  Does the patient want to be seen by Chronic Care Management for management of their diabetes?  No  Would the patient like to be referred to a Nutritionist or for Diabetic Management?  No   Diabetic Exams:  Diabetic Eye Exam: Completed 07/02/2019 Diabetic Foot Exam: Completed 01/02/2019   Interpreter Needed?: No  Information entered by :: NAllen LPN   Activities of Daily Living In your present state of health, do you have any difficulty performing the following activities: 12/19/2019 11/11/2019  Hearing? N N  Vision? N N  Difficulty concentrating or making decisions? N N  Walking or climbing stairs? N Y  Dressing or bathing? N N  Doing errands, shopping? N Y  Conservation officer, nature and eating ? N -  Using the Toilet? N -  In the past six months, have you accidently leaked urine? Y -  Comment wears a pad -  Do you have problems with loss of bowel control? N -  Managing your Medications? N -  Managing your Finances? N -  Housekeeping or managing your Housekeeping? N -  Some  recent data might be hidden    Patient Care Team: [name], DO as PCP - General (Family Medicine) [name] [name], MD as PCP - Cardiology (Cardiology) [name], MD as Consulting Physician (Cardiology) [name], MD as Consulting Physician (Urology) [name], MD as Consulting Physician (Nephrology) [name], MD as Referring Physician (Orthopedic Surgery) [name], MD as Attending Physician (Endocrinology) [name], MD as Referring Physician  (Vascular Surgery) [name], [facility] as Pharmacist (Pharmacist) [name], RN as Case Manager (General Practice)  Indicate any recent Medical Services you may have received from other than [facility] providers in the past year (date may be approximate).     Assessment:   This is a routine wellness examination for Dannya.  Hearing/Vision screen  Hearing Screening   _0  _1  _2  _3  _4  _5  _6  _7  _8   Right ear:           Left ear:           Vision Screening Comments: Regular eye exams, [facility]  Dietary issues and exercise activities discussed: Current Exercise Habits: Home exercise routine, Type of exercise: strength training/weights;walking, Time (Minutes): 60, Frequency (Times/Week): 7, Weekly Exercise (Minutes/Week): 420  Goals    .  "I want to keep feeling good" (pt-stated)      CARE PLAN ENTRY (see longtitudinal plan of care for additional care plan information)  Current Barriers:  . Diabetes: uncontrolled; complicated by chronic medical conditions including breast cancer, most recent A1c 8.4% . Most recent eGFR: ~34 mL/min . She is disappointed that her A1c was not below 8 as required by Ortho to have her knee replaced. This has greatly impacted her quality of life as she can barely walk and is no longer to attend church, drive a vehicle, travel or help her husband around the house. She feels like she can't control her diabetes and this is frustrating.She has a good family support system and is grateful for that. We talked about diet extensively.Praised patient for dietary efforts and great improvement in A1c from 9.8 in April.  . Current antihyperglycemic regimen: Trulicity 3 mg weekly, glipizide 5 mg QAM, Tresiba 24 units daily . Denies hypoglycemic symptoms, including dizziness, lightheadedness, shaking, sweating . Current glucose readings:  o Fastings in past few days 120-130s, excursions never above 170. . Cardiovascular risk  reduction: o Current hypertensive regimen: metoprolol succinate 50 mg daily, losartan 25 mg daily - had been decreased by cardiology in May to 12.5 mg daily d/t presenting w/ hypotension to the appointment, but patient notes that she generally avoids drinking water before appointments to avoid urinary accidents. She self-increased back to losartan 25 mg daily and reports home readings (and documented clinic readings) remain at goal <130/90 and no SBP <100 o Current hyperlipidemia regimen: rosuvastatin 40 mg daily  o Current antiplatelet regimen: Eliquis 2.5 mg BID (DVT ppx reduced dose) . Stage I breast cancer ER/PR positive HER-2 negative; follows w/ cancer center. On Arimadex 1 mg daily  . Overactive bladder: Myrbetriq 50 mg daily - trimethoprim 100 mg daily - reports urology gave her samples of Uribel but she stopped taking due to constipation. Has not started D-Mannose as recommended because she could not read the what the physician wrote and tried to explain to pharmacist but ultimately gave up. Reports she no longer having urinary symptoms and doesn't need to get up overnight to urinate. Pharmacist Clinical Goal(s):  [name] Over the next 90 days, patient will work  with PharmD and primary care provider to address optimized medication management  Interventions: . Comprehensive medication review performed, medication list updated in electronic medical record . Inter-disciplinary care team collaboration (see longitudinal plan of care) . Encouraged continued adherence to [place] 24 units daily and including post-prandial and bedtime glucometers to help identify areas of improvement. . Encouraged her to consider physical therapy/water therapy for her knee. She had a bad experience with PT and is not interested in pursuing at this time. [name] Extensively reviewed diet with patient. Encouraged her incorporate protein with each meal, reduce servings and portion size of fruit and eat dinner earlier as schedule  allows.  . Encouraged to contact Orthopedic office and relay her A1c progress from 9.8 to 8.4. [name] Collaborate with RN CM to provide dietary, lifestyle support.    Patient Self Care Activities:  . Patient will check blood glucose every morning and 2 hours after a meal and at bedtime. Record readings and bring to appointments. . Patient will take medications as prescribed . Patient will report any questions or concerns to provider  . Patient will incorporate protein into meals. Limit daily servings of fruit.  Please see past updates related to this goal by clicking on the "Past Updates" button in the selected goal      .  Cope with Chronic Pain      Follow Up Date 02/10/2020   - learn relaxation techniques - practice acceptance of chronic pain - tell myself I can (not I can't)    Why is this important?   Stress makes chronic pain feel worse.  Feelings like depression, anxiety, stress and anger can make your body more sensitive to pain.  Learning ways to cope with stress or depression may help you find some relief from the pain.     Notes: Is working with PT     .  DIET - INCREASE WATER INTAKE      Recommend drinking at least 6-8 glasses of water a day     .  Patient Stated      12/19/2019, wants to walk without assist device    .  RNCM: Manage Pain      Follow Up Date 02/10/2020   - develop a personal pain management plan - plan exercise or activity when pain is best controlled - prioritize tasks for the day - track times pain is worst and when it is best - track what makes the pain worse and what makes it better    Why is this important?   Day-to-day life can be hard when you have chronic pain.  Pain medicine is just one piece of the treatment puzzle.  You can try these action steps to help you manage your pain.    Notes: Currently working with PT    .  RNCM: Monitor and Manage My Blood Sugar      Follow Up Date 02/10/2020   - check blood sugar at prescribed times -  check blood sugar if I feel it is too high or too low - take the blood sugar log to all doctor visits    Why is this important?   Checking your blood sugar at home helps to keep it from getting very high or very low.  Writing the results in a diary or log helps the doctor know how to care for you.  Your blood sugar log should have the time, date and the results.  Also, write down the amount of insulin or other  medicine that you take.  Other information, like what you ate, exercise done and how you were feeling, will also be helpful.     Notes: Is compliant with blood sugar checks. Current readings 100-120    .  RNCM: Set My Target A1C      Follow Up Date 02/10/2020   - set target A1C    Why is this important?   Your target A1C is decided together by you and your doctor.  It is based on several things like your age and other health issues.    Notes: the patients current hemoglobin A1C is 7.7 on 11-11-2019    .  RNCM: Track and Manage My Blood Pressure      Follow Up Date 02/10/2020   - check blood pressure 3 times per week - write blood pressure results in a log or diary    Why is this important?   You won't feel high blood pressure, but it can still hurt your blood vessels.  High blood pressure can cause heart or kidney problems. It can also cause a stroke.  Making lifestyle changes like losing a little weight or eating less salt will help.  Checking your blood pressure at home and at different times of the day can help to control blood pressure.  If the doctor prescribes medicine remember to take it the way the doctor ordered.  Call the office if you cannot afford the medicine or if there are questions about it.     Notes: The patient is compliant with blood pressure management at this time.      Depression Screen PHQ 2/9 Scores 12/19/2019 12/17/2019 11/11/2019 11/06/2018 10/31/2017 10/25/2016 12/10/2015  PHQ - 2 Score 0 0 0 0 0 0 0  PHQ- 9 Score - 0 0 - - - -    Fall  Risk Fall Risk  12/19/2019 11/11/2019 11/06/2018 08/29/2018 08/15/2018  Falls in the past year? 0 1 1 0 0  Number falls in past yr: - 0 0 - -  Injury with Fall? - 0 0 - -  Risk Factor Category  - - - - -  Risk for fall due to : Impaired balance/gait;Impaired mobility;Medication side effect Impaired balance/gait;Impaired mobility History of fall(s);Impaired balance/gait - -  Follow up Falls evaluation completed;Education provided;Falls prevention discussed - Education provided - Falls evaluation completed    Any stairs in or around the home? Yes  If so, are there any without handrails? No  Home free of loose throw rugs in walkways, pet beds, electrical cords, etc? Yes  Adequate lighting in your home to reduce risk of falls? Yes   ASSISTIVE DEVICES UTILIZED TO PREVENT FALLS:  Life alert? No  Use of a cane, walker or w/c? Yes  Grab bars in the bathroom? Yes  Shower chair or bench in shower? Yes  Elevated toilet seat or a handicapped toilet? Yes   TIMED UP AND GO:  Was the test performed? No . c.     Cognitive Function:     6CIT Screen 12/19/2019 11/11/2019 10/31/2017 10/25/2016  What Year? 0 points 0 points 0 points 0 points  What month? 0 points 0 points 0 points 0 points  What time? 0 points 0 points 0 points 0 points  Count back from 20 0 points 0 points 0 points 0 points  Months in reverse 0 points 0 points 0 points 0 points  Repeat phrase 0 points 0 points 0 points 0 points  Total Score 0 0 0  0    Immunizations Immunization History  Administered Date(s) Administered  . Fluad Quad(high Dose 65+) 01/02/2019  . Influenza, High Dose Seasonal PF 11/16/2016, 12/18/2017  . Influenza,inj,Quad PF,6+ Mos 12/16/2014, 11/24/2015  . PFIZER SARS-COV-2 Vaccination 05/08/2019, 05/29/2019  . Pneumococcal Conjugate-13 07/08/2014  . Pneumococcal Polysaccharide-23 03/13/2012  . Zoster 03/13/2010    TDAP status: Due, Education has been provided regarding the importance of this vaccine.  Advised may receive this vaccine at local pharmacy or Health Dept. Aware to provide a copy of the vaccination record if obtained from local pharmacy or Health Dept. Verbalized acceptance and understanding. Flu Vaccine status: Up to date Pneumococcal vaccine status: Up to date Covid-19 vaccine status: Completed vaccines  Qualifies for Shingles Vaccine? Yes   Zostavax completed Yes   Shingrix Completed?: No.    Education has been provided regarding the importance of this vaccine. Patient has been advised to call insurance company to determine out of pocket expense if they have not yet received this vaccine. Advised may also receive vaccine at local pharmacy or Health Dept. Verbalized acceptance and understanding.  Screening Tests Health Maintenance  Topic Date Due  . INFLUENZA VACCINE  10/12/2019  . TETANUS/TDAP  01/02/2020 (Originally 08/19/1964)  . FOOT EXAM  01/02/2020  . COLONOSCOPY  01/28/2020  . HEMOGLOBIN A1C  05/10/2020  . OPHTHALMOLOGY EXAM  07/01/2020  . MAMMOGRAM  07/14/2021  . DEXA SCAN  Completed  . COVID-19 Vaccine  Completed  . Hepatitis C Screening  Completed  . PNA vac Low Risk Adult  Completed    Health Maintenance  Health Maintenance Due  Topic Date Due  . INFLUENZA VACCINE  10/12/2019    Colorectal cancer screening: Completed 01/28/2019. Repeat every 1 years Mammogram status: Completed 07/15/2019. Repeat every year Bone Density status: Completed 05/26/2019.   Lung Cancer Screening: (Low Dose CT Chest recommended if Age 6-80 years, 30 pack-year currently smoking OR have quit w/in 15years.) does not qualify.   Lung Cancer Screening Referral: no  Additional Screening:  Hepatitis C Screening: does qualify; Completed 07/14/2016  Vision Screening: Recommended annual ophthalmology exams for early detection of glaucoma and other disorders of the eye. Is the patient up to date with their annual eye exam?  Yes  Who is the provider or what is the name of the office in  which the patient attends annual eye exams? [facility] If pt is not established with a provider, would they like to be referred to a provider to establish care? No .   Dental Screening: Recommended annual dental exams for proper oral hygiene  Community Resource Referral / Chronic Care Management: CRR required this visit?  No   CCM required this visit?  No      Plan:     I have personally reviewed and noted the following in the patient's chart:   . Medical and social history . Use of alcohol, tobacco or illicit drugs  . Current medications and supplements . Functional ability and status . Nutritional status . Physical activity . Advanced directives . List of other physicians . Hospitalizations, surgeries, and ER visits in previous 12 months . Vitals . Screenings to include cognitive, depression, and falls . Referrals and appointments  In addition, I have reviewed and discussed with patient certain preventive protocols, quality metrics, and best practice recommendations. A written personalized care plan for preventive services as well as general preventive health recommendations were provided to patient.     Kellie Simmering, LPN   76/04/2631  Nurse Notes:

## 2019-12-22 ENCOUNTER — Ambulatory Visit: Payer: Medicare Other | Admitting: Dermatology

## 2019-12-24 ENCOUNTER — Ambulatory Visit: Payer: Medicare Other | Admitting: Dermatology

## 2019-12-24 ENCOUNTER — Ambulatory Visit: Payer: Medicare Other | Admitting: Pharmacist

## 2019-12-24 DIAGNOSIS — R2681 Unsteadiness on feet: Secondary | ICD-10-CM | POA: Diagnosis not present

## 2019-12-24 DIAGNOSIS — N183 Chronic kidney disease, stage 3 unspecified: Secondary | ICD-10-CM

## 2019-12-24 DIAGNOSIS — E1122 Type 2 diabetes mellitus with diabetic chronic kidney disease: Secondary | ICD-10-CM

## 2019-12-24 DIAGNOSIS — M1712 Unilateral primary osteoarthritis, left knee: Secondary | ICD-10-CM | POA: Diagnosis not present

## 2019-12-24 DIAGNOSIS — Z794 Long term (current) use of insulin: Secondary | ICD-10-CM

## 2019-12-24 DIAGNOSIS — E119 Type 2 diabetes mellitus without complications: Secondary | ICD-10-CM | POA: Diagnosis not present

## 2019-12-24 DIAGNOSIS — Z471 Aftercare following joint replacement surgery: Secondary | ICD-10-CM | POA: Diagnosis not present

## 2019-12-24 DIAGNOSIS — I129 Hypertensive chronic kidney disease with stage 1 through stage 4 chronic kidney disease, or unspecified chronic kidney disease: Secondary | ICD-10-CM

## 2019-12-24 DIAGNOSIS — Z96651 Presence of right artificial knee joint: Secondary | ICD-10-CM | POA: Diagnosis not present

## 2019-12-24 NOTE — Patient Instructions (Signed)
Visit Information  It was a pleasure speaking with you today. Thank you for letting me be part of your clinical team. Please call with any questions or concerns.   Goals Addressed              This Visit's Progress   .  "I want to keep feeling good" (pt-stated)        CARE PLAN ENTRY (see longtitudinal plan of care for additional care plan information)  Current Barriers:  . Diabetes: uncontrolled; complicated by chronic medical conditions including breast cancer, most recent A1c 7.7% . Most recent eGFR: ~330m/min . She is s/p total knee arthroscopy. Receiving PT twice weekly, only taking tylenol prn pain . Current antihyperglycemic regimen: Trulicity 3 mg weekly, glipizide 5 mg QAM, Jardiance 25 mg daily, Tresiba 24 units daily . Denies hypoglycemic symptoms, including dizziness, lightheadedness, shaking, sweating . Current glucose readings:  o Fastings in past few days 100-110s, excursions never above 120  . Cardiovascular risk reduction: o Current hypertensive regimen: metoprolol succinate 50 mg daily, losartan 25 mg daily - had been decreased by cardiology in May to 12.5 mg daily d/t presenting w/ hypotension to the appointment, but patient notes that she generally avoids drinking water before appointments to avoid urinary accidents. She self-increased back to losartan 25 mg daily and documented clinic readings remain at goal <130/90 and no SBP <100. Not checking at home. o Current hyperlipidemia regimen: rosuvastatin 40 mg daily  o Current antiplatelet regimen: Eliquis 2.5 mg BID (DVT ppx reduced dose) . Stage I breast cancer ER/PR positive HER-2 negative; follows w/ cancer center. On Arimadex 1 mg daily  . Overactive bladder: Myrbetriq 50 mg daily - trimethoprim 100 mg daily - restarted Uribel 1 cap qhs. Aware of potential interaction with duloxetine (serotonin syndrome) has tolerated previously. Denies increased HR, BP, sweting, diarrhea or confusion. Reports she no longer having  urinary symptoms and doesn't need to get up overnight to urinate. Pharmacist Clinical Goal(s):  .Marland KitchenOver the next 90 days, patient will work with PharmD and primary care provider to address optimized medication management  Interventions: . Comprehensive medication review performed, medication list updated in electronic medical record . Inter-disciplinary care team collaboration (see longitudinal plan of care) . Extensively reviewed diet with patient. Encouraged her incorporate protein with each meal, reduce servings and portion size of fruit and eat dinner earlier as schedule allows. .Marland KitchenCollaborate with RN CM to provide dietary, lifestyle support.    Patient Self Care Activities:  . Patient will check blood glucose every morning and occasionally 2 hours after a meal.Record readings and bring to appointments. . Patient will take medications as prescribed . Patient will report any questions or concerns to provider  . Patient will incorporate protein into meals. Limit daily servings of fruit.  Please see past updates related to this goal by clicking on the "Past Updates" button in the selected goal         The patient verbalized understanding of instructions provided today and agreed to receive a mailed copy of patient instruction and/or educational materials.  Telephone follow up appointment with pharmacy team member scheduled for: 3 months  JJunita Push HKenton KingfisherPharmD, BDillon BeachClinical Pharmacist [phone]

## 2019-12-24 NOTE — Chronic Care Management (AMB) (Signed)
Chronic Care Management   Follow Up Note   10/22/2019 Name: Tammy [name] MRN: [id number] DOB: 1945-06-17  Referred by: [name], DO Reason for referral : Chronic Care Management DM, HTN  Tammy [name] is a 74 y.o. year old female who is a primary care patient of [name], DO. The CCM team was consulted for assistance with chronic disease management and care coordination needs.    Review of patient status, including review of consultants reports, relevant laboratory and other test results, and collaboration with appropriate care team members and the patient's provider was performed as part of comprehensive patient evaluation and provision of chronic care management services.    SDOH (Social Determinants of Health) assessments performed: No See Care Plan activities for detailed interventions related to [place])    Office visits:  11/11/19- Dr. Wynetta Emery- wellness exam A1c 7.7, cleared for surgery, scr 1.77, eGFR 28  Consult visits: 11/18/19 - [facility]   Outpatient Encounter Medications as of 10/22/2019  Medication Sig Note  . anastrozole (ARIMIDEX) 1 MG tablet Take 1 tablet (1 mg total) by mouth daily.   . blood glucose meter kit and supplies KIT Dispense based on patient and insurance preference. Use one time daily as directed, E11.9   . Calcium Carb-Cholecalciferol (CALCIUM 600 + D) 600-200 MG-UNIT TABS Take 1 tablet by mouth daily.    . cetirizine (ZYRTEC) 10 MG tablet Take 1 tablet (10 mg total) by mouth daily.   . Dulaglutide (TRULICITY) 3 FF/6.3WG SOPN Inject 3 mg into the skin once a week.   . DULoxetine (CYMBALTA) 60 MG capsule TAKE 1 CAPSULE BY MOUTH ONCE DAILY   . ELIQUIS 2.5 MG TABS tablet TAKE 1 TABLET BY MOUTH TWICE DAILY   . gabapentin (NEURONTIN) 300 MG capsule Take 1 capsule (300 mg total) by mouth 3 (three) times daily.   [name] glipiZIDE (GLUCOTROL) 5 MG tablet TAKE ONE TABLET BY MOUTH EVERY DAY BEFORE BREAKFAST   . insulin degludec (TRESIBA FLEXTOUCH) 100  UNIT/ML FlexTouch Pen Inject 0.24 mLs (24 Units total) into the skin daily.   . Insulin Pen Needle (PEN NEEDLES 3/16") 31G X 5 MM MISC 1 each by Does not apply route daily.   . isosorbide mononitrate (IMDUR) 60 MG 24 hr tablet TAKE ONE TABLET BY MOUTH AT BEDTIME AND HALF A TABLET EVERY MORNING   . levothyroxine (SYNTHROID) 150 MCG tablet Take 1 tablet (150 mcg total) by mouth daily before breakfast.   . losartan (COZAAR) 25 MG tablet Take 0.5 tablets (12.5 mg total) by mouth daily. 08/20/2019: Taking 25 mg daily   . metoprolol succinate (TOPROL-XL) 50 MG 24 hr tablet TAKE ONE TABLET EVERY DAY WITH OR IMMEDIATELY FOLLOWING A MEAL   . mirabegron ER (MYRBETRIQ) 50 MG TB24 tablet Take 1 tablet (50 mg total) by mouth daily.   . montelukast (SINGULAIR) 10 MG tablet TAKE ONE TABLET BY MOUTH AT BEDTIME   . Multiple Vitamins-Minerals (ZINC PO) Take 50 mg by mouth daily.    . nitroGLYCERIN (NITROSTAT) 0.4 MG SL tablet Place 1 tablet (0.4 mg total) under the tongue every 5 (five) minutes as needed for chest pain.   [name] olopatadine (PATADAY) 0.1 % ophthalmic solution Place 1 drop into both eyes 2 (two) times daily.   . pantoprazole (PROTONIX) 40 MG tablet Take 1 tablet (40 mg total) by mouth daily as needed.   . Polyethylene Glycol 400 (BLINK TEARS) 0.25 % SOLN Place 1 drop into both eyes 3 (three)  times daily as needed (dry/irritated eyes.).   [name] PROAIR HFA 108 (90 Base) MCG/ACT inhaler TAKE 2 PUFFS EVERY 4 HOURS AS NEEDED FORWHEEZING OR SHORTNESS OF BREATH 08/05/2019: Using ~once per week  . rosuvastatin (CRESTOR) 40 MG tablet TAKE 1 TABLET BY MOUTH DAILY   . trimethoprim (TRIMPEX) 100 MG tablet Take 100 mg by mouth daily.   . cholecalciferol (VITAMIN D3) 25 MCG (1000 UT) tablet Take 1,000 Units by mouth daily.   . polyethylene glycol (MIRALAX MIX-IN PAX) 17 g packet Take 17 g by mouth daily.    No facility-administered encounter medications on file as of 10/22/2019.     Objective:   Goals Addressed       CARE PLAN ENTRY (see longtitudinal plan of care for additional care plan information)  Current Barriers:  . Diabetes: uncontrolled; complicated by chronic medical conditions including breast cancer, most recent A1c 7.7% . Most recent eGFR: ~375m/min . She is s/p total knee arthroscopy. Receiving PT twice weekly, only taking tylenol prn pain . Current antihyperglycemic regimen: Trulicity 3 mg weekly, glipizide 5 mg QAM, Jardiance 25 mg daily, Tresiba 24 units daily . Denies hypoglycemic symptoms, including dizziness, lightheadedness, shaking, sweating . Current glucose readings:  o Fastings in past few days 100-110s, excursions never above 120  . Cardiovascular risk reduction: o Current hypertensive regimen: metoprolol succinate 50 mg daily, losartan 25 mg daily - had been decreased by cardiology in May to 12.5 mg daily d/t presenting w/ hypotension to the appointment, but patient notes that she generally avoids drinking water before appointments to avoid urinary accidents. She self-increased back to losartan 25 mg daily and documented clinic readings remain at goal <130/90 and no SBP <100. Not checking at home. o Current hyperlipidemia regimen: rosuvastatin 40 mg daily  o Current antiplatelet regimen: Eliquis 2.5 mg BID (DVT ppx reduced dose) . Stage I breast cancer ER/PR positive HER-2 negative; follows w/ cancer center. On Arimadex 1 mg daily  . Overactive bladder: Myrbetriq 50 mg daily - trimethoprim 100 mg daily - restarted Uribel 1 cap qhs. Aware of potential interaction with duloxetine (serotonin syndrome) has tolerated previously. Denies increased HR, BP, sweting, diarrhea or confusion. Reports she no longer having urinary symptoms and doesn't need to get up overnight to urinate. Pharmacist Clinical Goal(s):  .Marland KitchenOver the next 90 days, patient will work with PharmD and primary care provider to address optimized medication management  Interventions: . Comprehensive medication review  performed, medication list updated in electronic medical record . Inter-disciplinary care team collaboration (see longitudinal plan of care) . Extensively reviewed diet with patient. Encouraged her incorporate protein with each meal, reduce servings and portion size of fruit and eat dinner earlier as schedule allows. .Marland KitchenCollaborate with RN CM to provide dietary, lifestyle support.    Patient Self Care Activities:  . Patient will check blood glucose every morning and occasionally 2 hours after a meal.Record readings and bring to appointments. . Patient will take medications as prescribed . Patient will report any questions or concerns to provider  . Patient will incorporate protein into meals. Limit daily servings of fruit.  Please see past updates related to this goal by clicking on the "Past Updates" button in the selected goal                    Plan:   Telephone follow up appointment scheduled for: 3 months   JJunita Push HKenton KingfisherPharmD, BGreenwoodFamily Practice [phone]

## 2019-12-25 ENCOUNTER — Encounter: Payer: Self-pay | Admitting: Dermatology

## 2019-12-25 ENCOUNTER — Other Ambulatory Visit: Payer: Self-pay

## 2019-12-25 ENCOUNTER — Ambulatory Visit (INDEPENDENT_AMBULATORY_CARE_PROVIDER_SITE_OTHER): Payer: Medicare Other | Admitting: Dermatology

## 2019-12-25 DIAGNOSIS — Z96651 Presence of right artificial knee joint: Secondary | ICD-10-CM | POA: Diagnosis not present

## 2019-12-25 DIAGNOSIS — L219 Seborrheic dermatitis, unspecified: Secondary | ICD-10-CM | POA: Diagnosis not present

## 2019-12-25 DIAGNOSIS — L821 Other seborrheic keratosis: Secondary | ICD-10-CM

## 2019-12-25 DIAGNOSIS — D1800 Hemangioma unspecified site: Secondary | ICD-10-CM

## 2019-12-25 DIAGNOSIS — D492 Neoplasm of unspecified behavior of bone, soft tissue, and skin: Secondary | ICD-10-CM

## 2019-12-25 DIAGNOSIS — D485 Neoplasm of uncertain behavior of skin: Secondary | ICD-10-CM

## 2019-12-25 DIAGNOSIS — L820 Inflamed seborrheic keratosis: Secondary | ICD-10-CM

## 2019-12-25 DIAGNOSIS — T8141XA Infection following a procedure, superficial incisional surgical site, initial encounter: Secondary | ICD-10-CM | POA: Diagnosis not present

## 2019-12-25 DIAGNOSIS — I200 Unstable angina: Secondary | ICD-10-CM

## 2019-12-25 DIAGNOSIS — D1801 Hemangioma of skin and subcutaneous tissue: Secondary | ICD-10-CM | POA: Diagnosis not present

## 2019-12-25 MED ORDER — KETOCONAZOLE 2 % EX SHAM: 1.0000 "application " | MEDICATED_SHAMPOO | CUTANEOUS | 4 refills | Status: DC

## 2019-12-25 MED ORDER — MOMETASONE FUROATE 0.1 % EX SOLN: CUTANEOUS | 1 refills | Status: DC

## 2019-12-25 NOTE — Patient Instructions (Addendum)
Wound Care Instructions  1. Cleanse wound gently with soap and water once a day then pat dry with clean gauze. Apply a thing coat of Petrolatum (petroleum jelly, "Vaseline") over the wound (unless you have an allergy to this). We recommend that you use a new, sterile tube of Vaseline. Do not pick or remove scabs. Do not remove the yellow or white "healing tissue" from the base of the wound.  2. Cover the wound with fresh, clean, nonstick gauze and secure with paper tape. You may use Band-Aids in place of gauze and tape if the would is small enough, but would recommend trimming much of the tape off as there is often too much. Sometimes Band-Aids can irritate the skin.  3. You should call the office for your biopsy report after 1 week if you have not already been contacted.  4. If you experience any problems, such as abnormal amounts of bleeding, swelling, significant bruising, significant pain, or evidence of infection, please call the office immediately.  5. FOR ADULT SURGERY PATIENTS: If you need something for pain relief you may take 1 extra strength Tylenol (acetaminophen) AND 2 Ibuprofen (200mg  each) together every 4 hours as needed for pain. (do not take these if you are allergic to them or if you have a reason you should not take them.) Typically, you may only need pain medication for 1 to 3 days.      Seborrheic Keratosis  What causes seborrheic keratoses? Seborrheic keratoses are harmless, common skin growths that first appear during adult life.  As time goes by, more growths appear.  Some people may develop a large number of them.  Seborrheic keratoses appear on both covered and uncovered body parts.  They are not caused by sunlight.  The tendency to develop seborrheic keratoses can be inherited.  They vary in color from skin-colored to gray, brown, or even black.  They can be either smooth or have a rough, warty surface.   Seborrheic keratoses are superficial and look as if they were  stuck on the skin.  Under the microscope this type of keratosis looks like layers upon layers of skin.  That is why at times the top layer may seem to fall off, but the rest of the growth remains and re-grows.    Treatment Seborrheic keratoses do not need to be treated, but can easily be removed in the office.  Seborrheic keratoses often cause symptoms when they rub on clothing or jewelry.  Lesions can be in the way of shaving.  If they become inflamed, they can cause itching, soreness, or burning.  Removal of a seborrheic keratosis can be accomplished by freezing, burning, or surgery. If any spot bleeds, scabs, or grows rapidly, please return to have it checked, as these can be an indication of a skin cancer.

## 2019-12-25 NOTE — Progress Notes (Signed)
   New Patient Visit  Subjective  Tammy [name] is a 74 y.o. female who presents for the following: check growth (L leg, ~64m, no symptoms, no hx of skin ca, no fhx of skin ca) and check spot (post neck, not sure how long it has been there, painful prn). She has other spots to be evaluated.  New patient referred by Park Liter, DO.  Patient accompanied by husband.  The following portions of the chart were reviewed this encounter and updated as appropriate:  Tobacco  Allergies  Meds  Problems  Med Hx  Surg Hx  Fam Hx     Review of Systems:  No other skin or systemic complaints except as noted in HPI or Assessment and Plan.  Objective  Well appearing patient in no apparent distress; mood and affect are within normal limits.  A focused examination was performed including left leg, posterior neck. Relevant physical exam findings are noted in the Assessment and Plan.  Objective  Left Thigh - Anterior: Dark pap 0.4cm  Objective  Neck - Posterior x 3 (3): Erythematous keratotic or waxy stuck-on papule or plaque.   Objective  Scalp: Scaling scalp   Assessment & Plan    Seborrheic Keratoses - Stuck-on, waxy, tan-brown papules and plaques  - Discussed benign etiology and prognosis. - Observe - Call for any changes  Hemangiomas - Red papules - Discussed benign nature - Observe - Call for any changes   Neoplasm of skin Left Thigh - Anterior  Epidermal / dermal shaving  Lesion diameter (cm):  0.4 Informed consent: discussed and consent obtained   Timeout: patient name, date of birth, surgical site, and procedure verified   Procedure prep:  Patient was prepped and draped in usual sterile fashion Prep type:  Isopropyl alcohol Anesthesia: the lesion was anesthetized in a standard fashion   Anesthetic:  1% lidocaine w/ epinephrine 1-100,000 buffered w/ 8.4% NaHCO3 Instrument used: flexible razor blade   Hemostasis achieved with: pressure, aluminum chloride and  electrodesiccation   Outcome: patient tolerated procedure well   Post-procedure details: sterile dressing applied and wound care instructions given   Dressing type: bandage and bacitracin    Specimen 1 - Surgical pathology Differential Diagnosis: D48.5 Hemangioma vs Blue Nevus vs Nevus vs Dysplastic Nevus Check Margins: yes Dark pap 0.4cm  Inflamed seborrheic keratosis (3) Neck - Posterior x 3  Destruction of lesion - Neck - Posterior x 3 Complexity: simple   Destruction method: cryotherapy   Informed consent: discussed and consent obtained   Timeout:  patient name, date of birth, surgical site, and procedure verified Lesion destroyed using liquid nitrogen: Yes   Region frozen until ice ball extended beyond lesion: Yes   Outcome: patient tolerated procedure well with no complications   Post-procedure details: wound care instructions given    Seborrheic dermatitis Scalp  Vs Psoriasis  Start Ketoconazole 2% shampoo 4d/wk, let sit several minutes and rinse out Start Mometasone sol qd up to 5d/wk aa scalp prn flares  ketoconazole (NIZORAL) 2 % shampoo - Scalp  mometasone (ELOCON) 0.1 % lotion - Scalp  Return if symptoms worsen or fail to improve.  I, Othelia Pulling, RMA, am acting as scribe for Sarina Ser, MD .  Documentation: I have reviewed the above documentation for accuracy and completeness, and I agree with the above.  Sarina Ser, MD

## 2019-12-31 ENCOUNTER — Telehealth: Payer: Self-pay

## 2019-12-31 DIAGNOSIS — M1712 Unilateral primary osteoarthritis, left knee: Secondary | ICD-10-CM | POA: Diagnosis not present

## 2019-12-31 DIAGNOSIS — Z471 Aftercare following joint replacement surgery: Secondary | ICD-10-CM | POA: Diagnosis not present

## 2019-12-31 DIAGNOSIS — E119 Type 2 diabetes mellitus without complications: Secondary | ICD-10-CM | POA: Diagnosis not present

## 2019-12-31 DIAGNOSIS — Z96651 Presence of right artificial knee joint: Secondary | ICD-10-CM | POA: Diagnosis not present

## 2019-12-31 DIAGNOSIS — R2681 Unsteadiness on feet: Secondary | ICD-10-CM | POA: Diagnosis not present

## 2019-12-31 NOTE — Telephone Encounter (Signed)
Advised patient of results/hd  

## 2019-12-31 NOTE — Telephone Encounter (Signed)
-----   Message from Ralene Bathe, MD sent at 12/30/2019  6:47 PM EDT ----- Skin , left thigh -anterior HEMANGIOMA  Benign hemangioma = collection of blood vessels No further treatment needed.

## 2020-01-07 DIAGNOSIS — Z471 Aftercare following joint replacement surgery: Secondary | ICD-10-CM | POA: Diagnosis not present

## 2020-01-07 DIAGNOSIS — Z96651 Presence of right artificial knee joint: Secondary | ICD-10-CM | POA: Diagnosis not present

## 2020-01-07 DIAGNOSIS — M1712 Unilateral primary osteoarthritis, left knee: Secondary | ICD-10-CM | POA: Diagnosis not present

## 2020-01-07 DIAGNOSIS — R2681 Unsteadiness on feet: Secondary | ICD-10-CM | POA: Diagnosis not present

## 2020-01-07 DIAGNOSIS — E119 Type 2 diabetes mellitus without complications: Secondary | ICD-10-CM | POA: Diagnosis not present

## 2020-01-12 ENCOUNTER — Other Ambulatory Visit: Payer: Self-pay | Admitting: Family Medicine

## 2020-01-12 DIAGNOSIS — R928 Other abnormal and inconclusive findings on diagnostic imaging of breast: Secondary | ICD-10-CM

## 2020-01-12 DIAGNOSIS — N6320 Unspecified lump in the left breast, unspecified quadrant: Secondary | ICD-10-CM

## 2020-01-19 ENCOUNTER — Inpatient Hospital Stay (HOSPITAL_BASED_OUTPATIENT_CLINIC_OR_DEPARTMENT_OTHER): Payer: Medicare Other | Admitting: Internal Medicine

## 2020-01-19 ENCOUNTER — Other Ambulatory Visit: Payer: Self-pay

## 2020-01-19 ENCOUNTER — Inpatient Hospital Stay: Payer: Medicare Other | Attending: Internal Medicine

## 2020-01-19 ENCOUNTER — Telehealth: Payer: Self-pay | Admitting: Internal Medicine

## 2020-01-19 ENCOUNTER — Encounter: Payer: Self-pay | Admitting: Internal Medicine

## 2020-01-19 DIAGNOSIS — Z170 Estrogen receptor positive status [ER+]: Secondary | ICD-10-CM | POA: Insufficient documentation

## 2020-01-19 DIAGNOSIS — E875 Hyperkalemia: Secondary | ICD-10-CM | POA: Diagnosis not present

## 2020-01-19 DIAGNOSIS — Z800 Family history of malignant neoplasm of digestive organs: Secondary | ICD-10-CM | POA: Diagnosis not present

## 2020-01-19 DIAGNOSIS — Z86711 Personal history of pulmonary embolism: Secondary | ICD-10-CM | POA: Insufficient documentation

## 2020-01-19 DIAGNOSIS — C50312 Malignant neoplasm of lower-inner quadrant of left female breast: Secondary | ICD-10-CM | POA: Diagnosis not present

## 2020-01-19 DIAGNOSIS — N183 Chronic kidney disease, stage 3 unspecified: Secondary | ICD-10-CM | POA: Diagnosis not present

## 2020-01-19 DIAGNOSIS — Z79811 Long term (current) use of aromatase inhibitors: Secondary | ICD-10-CM | POA: Insufficient documentation

## 2020-01-19 DIAGNOSIS — Z923 Personal history of irradiation: Secondary | ICD-10-CM | POA: Insufficient documentation

## 2020-01-19 DIAGNOSIS — Z86718 Personal history of other venous thrombosis and embolism: Secondary | ICD-10-CM | POA: Insufficient documentation

## 2020-01-19 DIAGNOSIS — Z8673 Personal history of transient ischemic attack (TIA), and cerebral infarction without residual deficits: Secondary | ICD-10-CM | POA: Insufficient documentation

## 2020-01-19 DIAGNOSIS — I200 Unstable angina: Secondary | ICD-10-CM

## 2020-01-19 DIAGNOSIS — K219 Gastro-esophageal reflux disease without esophagitis: Secondary | ICD-10-CM | POA: Insufficient documentation

## 2020-01-19 DIAGNOSIS — Z87891 Personal history of nicotine dependence: Secondary | ICD-10-CM | POA: Insufficient documentation

## 2020-01-19 DIAGNOSIS — Z801 Family history of malignant neoplasm of trachea, bronchus and lung: Secondary | ICD-10-CM | POA: Insufficient documentation

## 2020-01-19 LAB — CBC WITH DIFFERENTIAL/PLATELET
Abs Immature Granulocytes: 0.07 10*3/uL (ref 0.00–0.07)
Basophils Absolute: 0.1 10*3/uL (ref 0.0–0.1)
Basophils Relative: 1 %
Eosinophils Absolute: 0.3 10*3/uL (ref 0.0–0.5)
Eosinophils Relative: 4 %
HCT: 42.4 % (ref 36.0–46.0)
Hemoglobin: 12.8 g/dL (ref 12.0–15.0)
Immature Granulocytes: 1 %
Lymphocytes Relative: 33 %
Lymphs Abs: 3 10*3/uL (ref 0.7–4.0)
MCH: 27.7 pg (ref 26.0–34.0)
MCHC: 30.2 g/dL (ref 30.0–36.0)
MCV: 91.8 fL (ref 80.0–100.0)
Monocytes Absolute: 1.1 10*3/uL — ABNORMAL HIGH (ref 0.1–1.0)
Monocytes Relative: 12 %
Neutro Abs: 4.5 10*3/uL (ref 1.7–7.7)
Neutrophils Relative %: 49 %
Platelets: 313 10*3/uL (ref 150–400)
RBC: 4.62 MIL/uL (ref 3.87–5.11)
RDW: 14.2 % (ref 11.5–15.5)
WBC: 9 10*3/uL (ref 4.0–10.5)
nRBC: 0 % (ref 0.0–0.2)

## 2020-01-19 LAB — COMPREHENSIVE METABOLIC PANEL
ALT: 10 U/L (ref 0–44)
AST: 15 U/L (ref 15–41)
Albumin: 3.8 g/dL (ref 3.5–5.0)
Alkaline Phosphatase: 72 U/L (ref 38–126)
Anion gap: 9 (ref 5–15)
BUN: 30 mg/dL — ABNORMAL HIGH (ref 8–23)
CO2: 27 mmol/L (ref 22–32)
Calcium: 9.8 mg/dL (ref 8.9–10.3)
Chloride: 105 mmol/L (ref 98–111)
Creatinine, Ser: 1.7 mg/dL — ABNORMAL HIGH (ref 0.44–1.00)
GFR, Estimated: 31 mL/min — ABNORMAL LOW (ref >60)
Glucose, Bld: 156 mg/dL — ABNORMAL HIGH (ref 70–99)
Potassium: 5.6 mmol/L — ABNORMAL HIGH (ref 3.5–5.1)
Sodium: 141 mmol/L (ref 135–145)
Total Bilirubin: 0.4 mg/dL (ref 0.3–1.2)
Total Protein: 7.8 g/dL (ref 6.5–8.1)

## 2020-01-19 NOTE — Assessment & Plan Note (Addendum)
#  Left breast stage I breast cancer ER PR positive HER-2 negative; status post lumpectomy.  On arimidex.  # Clinically no evidence of disease recurrence [breast exam-today-suggestive of scar tissue].  Await mammogram end of the month.  Continue anastrozole for total of 5 years.  # BMD- FEB 2021- T score : - 1.5. discussed re: fosomax [GERD/not interested in pills]; again reviewed the risk factors and also the need for treatments.  Discussed regarding reclast; patient still undecided.  # CKD- III- creat 1.7.[NOV 2021-]; STABLE- Dr.Kolluru/follow-up with PCP.  # DISPOSITION: #Follow-up in 6 months; MD-; labs- cbc/cmp-Dr. B  Addendum: Patient potassium is slightly elevated at 5.6; asymptomatic. I have informed patient's nephrologist Dr. Lieutenant Diego to follow-up.

## 2020-01-19 NOTE — Addendum Note (Signed)
Addended by: Delice Bison E on: 01/19/2020 03:33 PM   Modules accepted: Orders

## 2020-01-19 NOTE — Progress Notes (Signed)
States that she feels a lump in her left breast. Noticed it a month ago. States she has a mammo scheduled for 11/29. Has also noticed increased fatigue recently.

## 2020-01-19 NOTE — Progress Notes (Signed)
[name] [place] CONSULT NOTE  Patient Care Team: [name], DO as PCP - General (Family Medicine) [name], [name], MD as PCP - Cardiology (Cardiology) [name] [name], MD as Consulting Physician (Cardiology) [name], MD as Consulting Physician (Urology) [name], MD as Consulting Physician (Nephrology) [name], MD as Referring Physician (Orthopedic Surgery) [name], MD as Attending Physician (Endocrinology) [name], MD as Referring Physician (Vascular Surgery) [name], [facility] as Pharmacist (Pharmacist) [name], RN as Case Manager (General Practice)  CHIEF COMPLAINTS/PURPOSE OF CONSULTATION: Breast cancer  #  Oncology History Overview Note   # July 2020 Left Breast- IMC; 14 mm; Grade: 2; Lymphovascular Invasion: Not identified;  pT1c pN0(sn); ER positive, PR positive, HER2 negative. [s/p lumpec; Dr.Byrnett]; rs/p RT [finished Sep, 9th 2020]  # Sep end 2020- START Arimidexa  # CKD-III/COPD/ CHF-compesated/PAD; CAD  DIAGNOSIS: Left breast cancer  STAGE: 1        ;GOALS: Cure  CURRENT/MOST RECENT THERAPY: arimidex   Malignant neoplasm of lower-inner quadrant of left female breast ([place]) (Resolved)  08/14/2018 Initial Diagnosis   Malignant neoplasm of lower-inner quadrant of left female breast [facility])   Carcinoma of lower-inner quadrant of left breast in female, estrogen receptor positive ([place])  10/02/2018 Initial Diagnosis   Carcinoma of lower-inner quadrant of left breast in female, estrogen receptor positive ([place])      HISTORY OF PRESENTING ILLNESS:  Tammy [name] 74 y.o.  female to history of stage I ER PR positive/HER-2 negative breast cancer is in for follow-up.  Patient concerned about her lump in the left breast at site of surgery.  Intermittent tenderness.  No lumps or bumps anywhere else.  Patient in the interim also underwent knee replacement.  She is awaiting contralateral knee replacement.  She is  walking with a cane.  Review of Systems  Constitutional: Negative for chills, diaphoresis, fever, malaise/fatigue and weight loss.  HENT: Negative for nosebleeds and sore throat.   Eyes: Negative for double vision.  Respiratory: Negative for cough, hemoptysis, sputum production, shortness of breath and wheezing.   Cardiovascular: Negative for chest pain, palpitations, orthopnea and leg swelling.  Gastrointestinal: Negative for abdominal pain, blood in stool, constipation, diarrhea, heartburn, melena, nausea and vomiting.  Genitourinary: Negative for dysuria, frequency and urgency.  Musculoskeletal: Positive for back pain and joint pain.  Skin: Negative.  Negative for itching and rash.  Neurological: Negative for dizziness, tingling, focal weakness, weakness and headaches.  Endo/Heme/Allergies: Does not bruise/bleed easily.  Psychiatric/Behavioral: Negative for depression. The patient is not nervous/anxious and does not have insomnia.      MEDICAL HISTORY:  Past Medical History:  Diagnosis Date  . Asthma   . Benign neoplasm of colon   . Breast cancer ([place]) 08/2018   left breast  . Cervical disc disease    "bulging" - no limitations per pt  . Chronic diastolic CHF (congestive heart failure) ([place])    a. 07/2012 Echo: Nl EF. mild inferoseptal HK, Gr 2 DD, mild conc LVH, mild PR/TR, mild PAH.  . CKD (chronic kidney disease), stage III ([place])   . COPD (chronic obstructive pulmonary disease) ([place])   . Coronary artery disease    a. 11/2011 Cath: LAD 16md, LCX min irregs, RCA 70p, 101mith L->R collats, EF 60%-->Med Rx.  . Diabetes mellitus without complication ([place]  . Fusion of lumbar spine 12/22/2015  . GERD (gastroesophageal reflux disease)   . History of DVT (deep vein  thrombosis)   . History of tobacco abuse    a. Quit 2011.  [name] Hyperlipidemia   . Hypertension   . Hypothyroidism   . Obesity   . Palpitations   . PE (pulmonary embolism) 10/15  . Personal history of radiation  therapy   . PONV (postoperative nausea and vomiting)   . Pulmonary embolus ([place]) 12/25/2013   RLL. Presented with SOB and elevated d-dimer.   [name] PVD (peripheral vascular disease) ([place])    a. PTA of right leg and left femoral artery with stenosis.  . Renal insufficiency   . Stroke [facility]) 2015   TIAs - no deficits    SURGICAL HISTORY: Past Surgical History:  Procedure Laterality Date  . ANGIOPLASTY / STENTING FEMORAL     PVD; angioplasty right leg and left femoral artery with stenosis.   . APPENDECTOMY  Oct. 2016  . BACK SURGERY  03/2012   nerve stimulator inserted  . BACK SURGERY     screws, rods replaced with new hardware.  [name] BACK SURGERY  11/2016  . BREAST LUMPECTOMY Left 08/26/2018  . BREAST SURGERY    . CARDIAC CATHETERIZATION  12/07/2011   Mid LAD 50%, distal LAD 50%, mid RCA 70%, distal RCA 100%   . CARDIAC CATHETERIZATION  01/04/2011   100% occluded mid RCA with good collaterals from distal LAD, normal LVEF.  [name] CHOLECYSTECTOMY    . COLONOSCOPY  2013  . COLONOSCOPY WITH PROPOFOL N/A 05/24/2015   Procedure: COLONOSCOPY WITH PROPOFOL;  Surgeon: [name], MD;  Location: [place];  Service: Endoscopy;  Laterality: N/A;  Diabetic - oral meds  . COLONOSCOPY WITH PROPOFOL N/A 01/28/2019   Procedure: COLONOSCOPY WITH PROPOFOL;  Surgeon: [name], MD;  Location: [facility] END[facility]COPY;  Service: Endoscopy;  Laterality: N/A;  . DIAGNOSTIC LAPAROSCOPY    . ESOPHAGOGASTRODUODENOSCOPY (EGD) WITH PROPOFOL N/A 01/28/2019   Procedure: ESOPHAGOGASTRODUODENOSCOPY (EGD) WITH PROPOFOL;  Surgeon: [name], MD;  Location: [facility] ENDOSCOPY;  Service: Endoscopy;  Laterality: N/A;  . LAPAROSCOPIC APPENDECTOMY N/A 01/06/2015   Procedure: APPENDECTOMY LAPAROSCOPIC;  Surgeon: [name], MD;  Location: [facility] ORS;  Service: General;  Laterality: N/A;  . LEFT HEART CATH AND CORONARY ANGIOGRAPHY Left 04/04/2019   Procedure: LEFT HEART CATH AND CORONARY ANGIOGRAPHY;   Surgeon: [name], MD;  Location: [place] [facility];  Service: Cardiovascular;  Laterality: Left;  [name] MASTECTOMY    . MASTECTOMY W/ SENTINEL NODE BIOPSY Left 08/26/2018   Procedure: PARTIAL MASTECTOMY WIDE EXCISION WITH SENTINEL LYMPH NODE BIOPSY LEFT;  Surgeon: [name], MD;  Location: [facility] ORS;  Service: General;  Laterality: Left;  . POLYPECTOMY  05/24/2015   Procedure: POLYPECTOMY;  Surgeon: [name], MD;  Location: [place];  Service: Endoscopy;;  . THYROIDECTOMY    . TOTAL KNEE ARTHROPLASTY Right 11/13/2019  . TRIGGER FINGER RELEASE      SOCIAL HISTORY: Social History   Socioeconomic History  . Marital status: Married    Spouse name: Not on file  . Number of children: 2  . Years of education: some college, Production assistant, radio   . Highest education level: Associate degree: occupational, Hotel manager, or vocational program  Occupational History  . Occupation: Retired  Tobacco Use  . Smoking status: Former Smoker    Packs/day: 0.50    Years: 15.00    Pack years: 7.50    Types: Cigarettes    Quit date: 03/14/2007    Years since quitting: 12.8  . Smokeless  tobacco: Never Used  Vaping Use  . Vaping Use: Never used  Substance and Sexual Activity  . Alcohol use: No    Alcohol/week: 0.0 standard drinks  . Drug use: Never  . Sexual activity: Yes    Birth control/protection: Post-menopausal  Other Topics Concern  . Not on file  Social History Narrative   Lives at home with husband in [place]. Quit smoking 10 years ago; never alcohol. 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    Social Determinants of Health   Financial Resource Strain: Low Risk   . Difficulty of Paying Living Expenses: Not hard at all  Food Insecurity: No Food Insecurity  . Worried About Charity fundraiser in the Last Year: Never true  . Ran Out of Food in the Last Year: Never true  Transportation Needs: No  Transportation Needs  . Lack of Transportation (Medical): No  . Lack of Transportation (Non-Medical): No  Physical Activity: Sufficiently Active  . Days of Exercise per Week: 7 days  . Minutes of Exercise per Session: 60 min  Stress: No Stress Concern Present  . Feeling of Stress : Not at all  Social Connections: Socially Integrated  . Frequency of Communication with Friends and Family: More than three times a week  . Frequency of Social Gatherings with Friends and Family: More than three times a week  . Attends Religious Services: More than 4 times per year  . Active Member of Clubs or Organizations: Yes  . Attends Archivist Meetings: 1 to 4 times per year  . Marital Status: Married  Human resources officer Violence: Unknown  . Fear of Current or Ex-Partner: No  . Emotionally Abused: No  . Physically Abused: No  . Sexually Abused: Not on file    FAMILY HISTORY: Family History  Problem Relation Age of Onset  . Hypertension Father   . Heart disease Father   . Heart attack Father   . Diabetes Father   . Cancer Mother        colon  . Heart attack Brother   . Diabetes Brother   . Hypertension Brother   . Heart disease Brother   . Heart attack Brother   . Diabetes Brother   . Hypertension Brother   . Heart disease Brother   . Heart attack Brother   . Diabetes Brother   . Hypertension Brother   . Heart disease Brother   . Cancer Sister        lung  . COPD Sister   . COPD Sister   . Breast cancer Neg Hx     ALLERGIES:  is allergic to hydrocodone, aleve [naproxen sodium], contrast media [iodinated diagnostic agents], ioxaglate, oxycodone, ranexa [ranolazine], sulfa antibiotics, and tramadol.  MEDICATIONS:  Current Outpatient Medications  Medication Sig Dispense Refill  . anastrozole (ARIMIDEX) 1 MG tablet Take 1 tablet (1 mg total) by mouth daily. 30 tablet 4  . blood glucose meter kit and supplies KIT Dispense based on patient and insurance preference. Use one  time daily as directed, E11.9 1 each 0  . Calcium Carb-Cholecalciferol (CALCIUM 600 + D) 600-200 MG-UNIT TABS Take 1 tablet by mouth daily.     . cetirizine (ZYRTEC) 10 MG tablet Take 1 tablet (10 mg total) by mouth daily. 30 tablet 11  . cholecalciferol (VITAMIN D3) 25 MCG (1000 UT) tablet Take 1,000 Units by mouth daily.    . Dulaglutide (TRULICITY) 3 HF/0.2OV SOPN Inject  0.5 mLs (3 mg total) into the skin once a week. 2 mL 6  . DULoxetine (CYMBALTA) 60 MG capsule Take 1 capsule (60 mg total) by mouth daily. 90 capsule 1  . ELIQUIS 2.5 MG TABS tablet TAKE 1 TABLET BY MOUTH TWICE DAILY 180 tablet 1  . empagliflozin (JARDIANCE) 25 MG TABS tablet Take 25 mg by mouth daily.    [name] gabapentin (NEURONTIN) 300 MG capsule Take 1 capsule (300 mg total) by mouth 3 (three) times daily. 270 capsule 3  . glipiZIDE (GLUCOTROL) 5 MG tablet TAKE ONE TABLET BY MOUTH EVERY DAY BEFORE BREAKFAST 90 tablet 1  . insulin degludec (TRESIBA FLEXTOUCH) 100 UNIT/ML FlexTouch Pen Inject 0.24 mLs (24 Units total) into the skin daily. 12 mL 1  . Insulin Pen Needle (PEN NEEDLES 3/16") 31G X 5 MM MISC 1 each by Does not apply route daily. 100 each 12  . isosorbide mononitrate (IMDUR) 60 MG 24 hr tablet TAKE ONE TABLET BY MOUTH AT BEDTIME AND HALF A TABLET EVERY MORNING 135 tablet 3  . ketoconazole (NIZORAL) 2 % shampoo Apply 1 application topically 4 (four) times a week. Wash scalp 4 times weekly, let sit 5 minutes and rinse out 120 mL 4  . levothyroxine (SYNTHROID) 150 MCG tablet Take 1 tablet (150 mcg total) by mouth daily before breakfast. 30 tablet 2  . losartan (COZAAR) 25 MG tablet Take 0.5 tablets (12.5 mg total) by mouth daily. 45 tablet 3  . Meth-Hyo-M Bl-Na Phos-Ph Sal (URIBEL) 118 MG CAPS Take 1 capsule (118 mg total) by mouth 3 (three) times daily as needed (Urinary frequency, urgency, burning). 30 capsule 3  . metoprolol succinate (TOPROL-XL) 50 MG 24 hr tablet TAKE ONE TABLET EVERY DAY WITH OR IMMEDIATELY FOLLOWING A  MEAL 90 tablet 1  . mometasone (ELOCON) 0.1 % lotion Apply topically as directed. Qd up to 5 days a week to aa scalp until clear, then prn flares 60 mL 1  . montelukast (SINGULAIR) 10 MG tablet Take 1 tablet (10 mg total) by mouth at bedtime. 90 tablet 1  . Multiple Vitamins-Minerals (ZINC PO) Take 50 mg by mouth daily.     [name] MYRBETRIQ 50 MG TB24 tablet TAKE ONE TABLET EVERY DAY 30 tablet 2  . nitroGLYCERIN (NITROSTAT) 0.4 MG SL tablet Place 1 tablet (0.4 mg total) under the tongue every 5 (five) minutes as needed for chest pain. 25 tablet 6  . olopatadine (PATADAY) 0.1 % ophthalmic solution Place 1 drop into both eyes 2 (two) times daily. 5 mL 12  . pantoprazole (PROTONIX) 40 MG tablet Take 1 tablet (40 mg total) by mouth daily as needed. 90 tablet 1  . Polyethylene Glycol 400 (BLINK TEARS) 0.25 % SOLN Place 1 drop into both eyes 3 (three) times daily as needed (dry/irritated eyes.).     [name] PROAIR HFA 108 (90 Base) MCG/ACT inhaler TAKE 2 PUFFS EVERY 4 HOURS AS NEEDED FORWHEEZING OR SHORTNESS OF BREATH 8.5 g 3  . rosuvastatin (CRESTOR) 40 MG tablet Take 1 tablet (40 mg total) by mouth daily. 90 tablet 1  . trimethoprim (TRIMPEX) 100 MG tablet Take 100 mg by mouth daily.     No current facility-administered medications for this visit.      [name]  PHYSICAL EXAMINATION: ECOG PERFORMANCE STATUS: 1 - Symptomatic but completely ambulatory  Vitals:   01/19/20 1026  BP: 126/65  Pulse: 71  Resp: 16  Temp: 99.6 F (37.6 C)  SpO2: 94%   Filed Weights  01/19/20 1026  Weight: 211 lb (95.7 kg)    Physical Exam Constitutional:      Comments: Patient is ambulating with a cane.  She is accompanied by her husband.  HENT:     Head: Normocephalic and atraumatic.     Mouth/Throat:     Pharynx: No oropharyngeal exudate.  Eyes:     Pupils: Pupils are equal, round, and reactive to light.  Cardiovascular:     Rate and Rhythm: Normal rate and regular rhythm.  Pulmonary:     Effort: Pulmonary effort  is normal. No respiratory distress.     Breath sounds: Normal breath sounds. No wheezing.  Abdominal:     General: Bowel sounds are normal. There is no distension.     Palpations: Abdomen is soft. There is no mass.     Tenderness: There is no abdominal tenderness. There is no guarding or rebound.  Musculoskeletal:        General: No tenderness. Normal range of motion.     Cervical back: Normal range of motion and neck supple.  Skin:    General: Skin is warm.     Comments: Right and left BREAST exam (in the presence of nurse)- no unusual skin changes or dominant masses felt. Surgical scars noted.    Neurological:     Mental Status: She is alert and oriented to person, place, and time.  Psychiatric:        Mood and Affect: Affect normal.    LABORATORY DATA:  I have reviewed the data as listed Lab Results  Component Value Date   WBC 9.0 01/19/2020   HGB 12.8 01/19/2020   HCT 42.4 01/19/2020   MCV 91.8 01/19/2020   PLT 313 01/19/2020   Recent Labs    09/02/19 1534 09/02/19 1534 11/11/19 1424 11/27/19 1356 01/19/20 1005  NA 140   < > 141 140 141  K 5.1   < > 4.9 5.4* 5.6*  CL 102   < > 102 102 105  CO2 27   < > 25 23 27   GLUCOSE 173*   < > 109* 108* 156*  BUN 26   < > 40* 28* 30*  CREATININE 1.49*   < > 1.77* 1.61* 1.70*  CALCIUM 9.5   < > 9.1 9.6 9.8  GFRNONAA 34*   < > 28* 31* 31*  GFRAA 39*  --  32* 36*  --   PROT 7.1  --  6.9  --  7.8  ALBUMIN 4.1  --  4.1  --  3.8  AST 15  --  14  --  15  ALT 13  --  10  --  10  ALKPHOS 88  --  80  --  72  BILITOT 0.5  --  0.4  --  0.4   < > = values in this interval not displayed.    RADIOGRAPHIC STUDIES: I have personally reviewed the radiological images as listed and agreed with the findings in the report. No results found.  ASSESSMENT & PLAN:   Carcinoma of lower-inner quadrant of left breast in female, estrogen receptor positive ([place]) #Left breast stage I breast cancer ER PR positive HER-2 negative; status post  lumpectomy.  On arimidex.  # Clinically no evidence of disease recurrence [breast exam-today-suggestive of scar tissue].  Await mammogram end of the month.  Continue anastrozole for total of 5 years.  # BMD- FEB 2021- T score : - 1.5. discussed re: fosomax [GERD/not interested in pills]; again reviewed  the risk factors and also the need for treatments.  Discussed regarding reclast; patient still undecided.  # CKD- III- creat 1.7.[NOV 2021-]; STABLE- Dr.Kolluru/follow-up with PCP.  # DISPOSITION: #Follow-up in 6 months; MD-; labs- cbc/cmp-Dr. B  Addendum: Patient potassium is slightly elevated at 5.6; asymptomatic. I have informed patient's nephrologist Dr. Lieutenant Diego to follow-up.    All questions were answered. The patient/family knows to call the clinic with any problems, questions or concerns.       Cammie Sickle, MD 01/19/2020 3:28 PM

## 2020-01-19 NOTE — Telephone Encounter (Signed)
On 11/08-Discussed with Dr.Kolluru re: hyperkalemia.  I Spoke to pt re: elevated K- 5.6; recommend stopping Losartan. Pt has appt with [place] office next week.  GB

## 2020-01-24 ENCOUNTER — Other Ambulatory Visit: Payer: Self-pay | Admitting: Physician Assistant

## 2020-01-24 DIAGNOSIS — N3281 Overactive bladder: Secondary | ICD-10-CM

## 2020-01-26 DIAGNOSIS — N1832 Chronic kidney disease, stage 3b: Secondary | ICD-10-CM | POA: Diagnosis not present

## 2020-01-26 DIAGNOSIS — I129 Hypertensive chronic kidney disease with stage 1 through stage 4 chronic kidney disease, or unspecified chronic kidney disease: Secondary | ICD-10-CM | POA: Diagnosis not present

## 2020-01-26 DIAGNOSIS — E1122 Type 2 diabetes mellitus with diabetic chronic kidney disease: Secondary | ICD-10-CM | POA: Diagnosis not present

## 2020-01-26 DIAGNOSIS — R809 Proteinuria, unspecified: Secondary | ICD-10-CM | POA: Diagnosis not present

## 2020-01-26 DIAGNOSIS — E875 Hyperkalemia: Secondary | ICD-10-CM | POA: Diagnosis not present

## 2020-02-09 ENCOUNTER — Other Ambulatory Visit: Payer: Self-pay

## 2020-02-09 ENCOUNTER — Ambulatory Visit
Admission: RE | Admit: 2020-02-09 | Discharge: 2020-02-09 | Disposition: A | Discharge status: Home / Self Care | Payer: Medicare Other | Source: Ambulatory Visit | Attending: Family Medicine | Admitting: Family Medicine

## 2020-02-09 DIAGNOSIS — N6489 Other specified disorders of breast: Secondary | ICD-10-CM | POA: Diagnosis not present

## 2020-02-09 DIAGNOSIS — R928 Other abnormal and inconclusive findings on diagnostic imaging of breast: Secondary | ICD-10-CM | POA: Diagnosis not present

## 2020-02-10 ENCOUNTER — Other Ambulatory Visit: Payer: Self-pay | Admitting: Family Medicine

## 2020-02-10 ENCOUNTER — Ambulatory Visit (INDEPENDENT_AMBULATORY_CARE_PROVIDER_SITE_OTHER): Payer: Medicare Other | Admitting: Family Medicine

## 2020-02-10 ENCOUNTER — Encounter: Payer: Self-pay | Admitting: Family Medicine

## 2020-02-10 ENCOUNTER — Telehealth: Payer: Self-pay

## 2020-02-10 VITALS — BP 108/70 | HR 82 | Temp 97.6°F | Ht 62.99 in | Wt 210.4 lb

## 2020-02-10 DIAGNOSIS — N6320 Unspecified lump in the left breast, unspecified quadrant: Secondary | ICD-10-CM

## 2020-02-10 DIAGNOSIS — I200 Unstable angina: Secondary | ICD-10-CM

## 2020-02-10 DIAGNOSIS — R928 Other abnormal and inconclusive findings on diagnostic imaging of breast: Secondary | ICD-10-CM

## 2020-02-10 DIAGNOSIS — Z1211 Encounter for screening for malignant neoplasm of colon: Secondary | ICD-10-CM | POA: Diagnosis not present

## 2020-02-10 DIAGNOSIS — N183 Chronic kidney disease, stage 3 unspecified: Secondary | ICD-10-CM

## 2020-02-10 DIAGNOSIS — Z23 Encounter for immunization: Secondary | ICD-10-CM

## 2020-02-10 DIAGNOSIS — Z794 Long term (current) use of insulin: Secondary | ICD-10-CM | POA: Diagnosis not present

## 2020-02-10 DIAGNOSIS — E1122 Type 2 diabetes mellitus with diabetic chronic kidney disease: Secondary | ICD-10-CM | POA: Diagnosis not present

## 2020-02-10 LAB — BAYER DCA HB A1C WAIVED: HB A1C (BAYER DCA - WAIVED): 7 % — ABNORMAL HIGH (ref <7.0)

## 2020-02-10 MED ORDER — GLIPIZIDE 5 MG PO TABS
ORAL_TABLET | ORAL | 1 refills | Status: DC
Start: 2020-02-10 — End: 2020-05-14

## 2020-02-10 NOTE — Progress Notes (Signed)
BP 108/70   Pulse 82   Temp 97.6 F (36.4 C) (Oral)   Ht 5' 2.99" (1.6 m)   Wt 210 lb 6.4 oz (95.4 kg)   SpO2 98%   BMI 37.28 kg/m    Subjective:    Patient ID: Tammy [name], female    DOB: Jul 29, 1945, 74 y.o.   MRN: [id number]  HPI: Tammy [name] is a 74 y.o. female  Chief Complaint  Patient presents with  . Diabetes   DIABETES Hypoglycemic episodes:no Polydipsia/polyuria: no Visual disturbance: no Chest pain: no Paresthesias: no Glucose Monitoring: yes  Accucheck frequency: Daily  Fasting glucose: 100s Taking Insulin?: yes Blood Pressure Monitoring: not checking Retinal Examination: Up to Date Foot Exam: Done today Diabetic Education: Completed Pneumovax: Up to Date Influenza: Given today Aspirin: yes   Relevant past medical, surgical, family and social history reviewed and updated as indicated. Interim medical history since our last visit reviewed. Allergies and medications reviewed and updated.  Review of Systems  Constitutional: Negative.   Respiratory: Negative.   Cardiovascular: Negative.   Gastrointestinal: Negative.   Musculoskeletal: Negative.   Neurological: Negative.   Psychiatric/Behavioral: Negative.     Per HPI unless specifically indicated above     Objective:    BP 108/70   Pulse 82   Temp 97.6 F (36.4 C) (Oral)   Ht 5' 2.99" (1.6 m)   Wt 210 lb 6.4 oz (95.4 kg)   SpO2 98%   BMI 37.28 kg/m   Wt Readings from Last 3 Encounters:  02/10/20 210 lb 6.4 oz (95.4 kg)  01/19/20 211 lb (95.7 kg)  12/19/19 210 lb (95.3 kg)    Physical Exam Vitals and nursing note reviewed.  Constitutional:      General: She is not in acute distress.    Appearance: Normal appearance. She is not ill-appearing, toxic-appearing or diaphoretic.  HENT:     Head: Normocephalic and atraumatic.     Right Ear: External ear normal.     Left Ear: External ear normal.     Nose: Nose normal.     Mouth/Throat:     Mouth: Mucous membranes are moist.      Pharynx: Oropharynx is clear.  Eyes:     General: No scleral icterus.       Right eye: No discharge.        Left eye: No discharge.     Extraocular Movements: Extraocular movements intact.     Conjunctiva/sclera: Conjunctivae normal.     Pupils: Pupils are equal, round, and reactive to light.  Cardiovascular:     Rate and Rhythm: Normal rate and regular rhythm.     Pulses: Normal pulses.     Heart sounds: Normal heart sounds. No murmur heard.  No friction rub. No gallop.   Pulmonary:     Effort: Pulmonary effort is normal. No respiratory distress.     Breath sounds: Normal breath sounds. No stridor. No wheezing, rhonchi or rales.  Chest:     Chest wall: No tenderness.  Musculoskeletal:        General: Normal range of motion.     Cervical back: Normal range of motion and neck supple.  Skin:    General: Skin is warm and dry.     Capillary Refill: Capillary refill takes less than 2 seconds.     Coloration: Skin is not jaundiced or pale.     Findings: No bruising, erythema, lesion or rash.  Neurological:     General: No  focal deficit present.     Mental Status: She is alert and oriented to person, place, and time. Mental status is at baseline.  Psychiatric:        Mood and Affect: Mood normal.        Behavior: Behavior normal.        Thought Content: Thought content normal.        Judgment: Judgment normal.     Results for orders placed or performed in visit on 02/10/20  Bayer DCA Hb A1c Waived  Result Value Ref Range   HB A1C (BAYER DCA - WAIVED) 7.0 (H) <7.0 %  Comprehensive metabolic panel  Result Value Ref Range   Glucose 84 65 - 99 mg/dL   BUN 23 8 - 27 mg/dL   Creatinine, Ser 1.75 (H) 0.57 - 1.00 mg/dL   GFR calc non Af Amer 28 (L) >59 mL/min/1.73   GFR calc Af Amer 32 (L) >59 mL/min/1.73   BUN/Creatinine Ratio 13 12 - 28   Sodium 144 134 - 144 mmol/L   Potassium 5.2 3.5 - 5.2 mmol/L   Chloride 106 96 - 106 mmol/L   CO2 23 20 - 29 mmol/L   Calcium 9.4 8.7 - 10.3  mg/dL   Total Protein 7.2 6.0 - 8.5 g/dL   Albumin 4.3 3.7 - 4.7 g/dL   Globulin, Total 2.9 1.5 - 4.5 g/dL   Albumin/Globulin Ratio 1.5 1.2 - 2.2   Bilirubin Total 0.3 0.0 - 1.2 mg/dL   Alkaline Phosphatase 84 44 - 121 IU/L   AST 15 0 - 40 IU/L   ALT 11 0 - 32 IU/L  CBC with Differential/Platelet  Result Value Ref Range   WBC 9.3 3.4 - 10.8 x10E3/uL   RBC 4.71 3.77 - 5.28 x10E6/uL   Hemoglobin 13.0 11.1 - 15.9 g/dL   Hematocrit 40.2 34.0 - 46.6 %   MCV 85 79 - 97 fL   MCH 27.6 26.6 - 33.0 pg   MCHC 32.3 31 - 35 g/dL   RDW 13.0 11.7 - 15.4 %   Platelets 303 150 - 450 x10E3/uL   Neutrophils 45 Not Estab. %   Lymphs 39 Not Estab. %   Monocytes 11 Not Estab. %   Eos 3 Not Estab. %   Basos 1 Not Estab. %   Neutrophils Absolute 4.3 1.40 - 7.00 x10E3/uL   Lymphocytes Absolute 3.7 (H) 0 - 3 x10E3/uL   Monocytes Absolute 1.0 (H) 0 - 0 x10E3/uL   EOS (ABSOLUTE) 0.3 0.0 - 0.4 x10E3/uL   Basophils Absolute 0.1 0 - 0 x10E3/uL   Immature Granulocytes 1 Not Estab. %   Immature Grans (Abs) 0.1 0.0 - 0.1 x10E3/uL      Assessment & Plan:   Problem List Items Addressed This Visit      Endocrine   Type 2 diabetes mellitus with diabetic chronic kidney disease ([place]) - Primary    Doing great with A1c of 7.0. Continue current regimen. Continue to monitor. Call with any concerns.       Relevant Medications   glipiZIDE (GLUCOTROL) 5 MG tablet   Other Relevant Orders   Bayer DCA Hb A1c Waived (Completed)   Comprehensive metabolic panel (Completed)   CBC with Differential/Platelet (Completed)    Other Visit Diagnoses    Screening for colon cancer       Due for repeat colonoscopy, wants to see Dr. Allen Norris. Referral generated today.   Relevant Orders   Ambulatory referral to Gastroenterology   Need  for influenza vaccination       Flu shot given today.    Relevant Orders   Flu Vaccine QUAD High Dose(Fluad)       Follow up plan: Return in about 3 months (around 05/10/2020).

## 2020-02-11 ENCOUNTER — Telehealth: Payer: Self-pay | Admitting: *Deleted

## 2020-02-11 LAB — CBC WITH DIFFERENTIAL/PLATELET
Basophils Absolute: 0.1 10*3/uL (ref 0.0–0.2)
Basos: 1 %
EOS (ABSOLUTE): 0.3 10*3/uL (ref 0.0–0.4)
Eos: 3 %
Hematocrit: 40.2 % (ref 34.0–46.6)
Hemoglobin: 13 g/dL (ref 11.1–15.9)
Immature Grans (Abs): 0.1 10*3/uL (ref 0.0–0.1)
Immature Granulocytes: 1 %
Lymphocytes Absolute: 3.7 10*3/uL — ABNORMAL HIGH (ref 0.7–3.1)
Lymphs: 39 %
MCH: 27.6 pg (ref 26.6–33.0)
MCHC: 32.3 g/dL (ref 31.5–35.7)
MCV: 85 fL (ref 79–97)
Monocytes Absolute: 1 10*3/uL — ABNORMAL HIGH (ref 0.1–0.9)
Monocytes: 11 %
Neutrophils Absolute: 4.3 10*3/uL (ref 1.4–7.0)
Neutrophils: 45 %
Platelets: 303 10*3/uL (ref 150–450)
RBC: 4.71 x10E6/uL (ref 3.77–5.28)
RDW: 13 % (ref 11.7–15.4)
WBC: 9.3 10*3/uL (ref 3.4–10.8)

## 2020-02-11 LAB — COMPREHENSIVE METABOLIC PANEL
ALT: 11 IU/L (ref 0–32)
AST: 15 IU/L (ref 0–40)
Albumin/Globulin Ratio: 1.5 (ref 1.2–2.2)
Albumin: 4.3 g/dL (ref 3.7–4.7)
Alkaline Phosphatase: 84 IU/L (ref 44–121)
BUN/Creatinine Ratio: 13 (ref 12–28)
BUN: 23 mg/dL (ref 8–27)
Bilirubin Total: 0.3 mg/dL (ref 0.0–1.2)
CO2: 23 mmol/L (ref 20–29)
Calcium: 9.4 mg/dL (ref 8.7–10.3)
Chloride: 106 mmol/L (ref 96–106)
Creatinine, Ser: 1.75 mg/dL — ABNORMAL HIGH (ref 0.57–1.00)
GFR calc Af Amer: 32 mL/min/{1.73_m2} — ABNORMAL LOW (ref >59)
GFR calc non Af Amer: 28 mL/min/{1.73_m2} — ABNORMAL LOW (ref >59)
Globulin, Total: 2.9 g/dL (ref 1.5–4.5)
Glucose: 84 mg/dL (ref 65–99)
Potassium: 5.2 mmol/L (ref 3.5–5.2)
Sodium: 144 mmol/L (ref 134–144)
Total Protein: 7.2 g/dL (ref 6.0–8.5)

## 2020-02-11 NOTE — Telephone Encounter (Signed)
Left message with pt to see if she was ready to reschedule her appts in the lung nodule clinic. Instructed pt to call back to reschedule appts.

## 2020-02-12 ENCOUNTER — Ambulatory Visit
Admission: RE | Admit: 2020-02-12 | Discharge: 2020-02-12 | Disposition: A | Discharge status: Home / Self Care | Payer: Medicare Other | Source: Ambulatory Visit | Attending: Family Medicine | Admitting: Family Medicine

## 2020-02-12 ENCOUNTER — Other Ambulatory Visit: Payer: Self-pay

## 2020-02-12 DIAGNOSIS — N6489 Other specified disorders of breast: Secondary | ICD-10-CM | POA: Diagnosis not present

## 2020-02-12 DIAGNOSIS — N6320 Unspecified lump in the left breast, unspecified quadrant: Secondary | ICD-10-CM

## 2020-02-12 DIAGNOSIS — L7634 Postprocedural seroma of skin and subcutaneous tissue following other procedure: Secondary | ICD-10-CM | POA: Diagnosis not present

## 2020-02-12 DIAGNOSIS — R928 Other abnormal and inconclusive findings on diagnostic imaging of breast: Secondary | ICD-10-CM | POA: Diagnosis not present

## 2020-02-15 ENCOUNTER — Encounter: Payer: Self-pay | Admitting: Family Medicine

## 2020-02-15 NOTE — Assessment & Plan Note (Signed)
Doing great with A1c of 7.0. Continue current regimen. Continue to monitor. Call with any concerns.

## 2020-02-16 ENCOUNTER — Telehealth (INDEPENDENT_AMBULATORY_CARE_PROVIDER_SITE_OTHER): Payer: Self-pay | Admitting: Gastroenterology

## 2020-02-16 ENCOUNTER — Other Ambulatory Visit: Payer: Self-pay

## 2020-02-16 DIAGNOSIS — Z9012 Acquired absence of left breast and nipple: Secondary | ICD-10-CM | POA: Insufficient documentation

## 2020-02-16 DIAGNOSIS — Z86010 Personal history of colonic polyps: Secondary | ICD-10-CM

## 2020-02-16 DIAGNOSIS — C50919 Malignant neoplasm of unspecified site of unspecified female breast: Secondary | ICD-10-CM | POA: Insufficient documentation

## 2020-02-16 DIAGNOSIS — J45909 Unspecified asthma, uncomplicated: Secondary | ICD-10-CM | POA: Insufficient documentation

## 2020-02-16 DIAGNOSIS — Z9889 Other specified postprocedural states: Secondary | ICD-10-CM | POA: Insufficient documentation

## 2020-02-16 DIAGNOSIS — D649 Anemia, unspecified: Secondary | ICD-10-CM | POA: Insufficient documentation

## 2020-02-16 NOTE — Progress Notes (Signed)
Patient has requested Dr. Allen Norris for her repeat colonoscopy.  She states that prior to Dr. Bonna Gains performing the colonoscopy in 01/28/19 Dr. Allen Norris had performed her colonoscopy in the past, and although she has been seen by Dr. Bonna Gains she really prefers Dr. Allen Norris.  She said its nothing about Dr. Bonna Gains, however she did have a bad experience at [facility].  I informed her that I would let both physician's be made aware. Bowel prep sample provided to patient to pick up tomorrow.  Gastroenterology Pre-Procedure Review  Request Date: Friday 04/02/20 Requesting Physician: Dr. Allen Norris  PATIENT REVIEW QUESTIONS: The patient responded to the following health history questions as indicated:    1. Are you having any GI issues? yes (occasional stomach ache, and constipation) 2. Do you have a personal history of Polyps? yes (01/28/19 polyp noted on colonoscopy performed by Dr. Bonna Gains) 3. Do you have a family history of Colon Cancer or Polyps? yes (mother and brother had colon cancer) 4. Diabetes Mellitus? yes (type 2) 5. Joint replacements in the past 12 months?no 6. Major health problems in the past 3 months?no 7. Any artificial heart valves, MVP, or defibrillator?no    MEDICATIONS & ALLERGIES:    Patient reports the following regarding taking any anticoagulation/antiplatelet therapy:   Plavix, Coumadin, Eliquis, Xarelto, Lovenox, Pradaxa, Brilinta, or Effient? Eliquis blood thinner sent to Dr. [name] Aspirin? no  Patient confirms/reports the following medications:  Current Outpatient Medications  Medication Sig Dispense Refill  . anastrozole (ARIMIDEX) 1 MG tablet Take 1 tablet (1 mg total) by mouth daily. 30 tablet 4  . blood glucose meter kit and supplies KIT Dispense based on patient and insurance preference. Use one time daily as directed, E11.9 1 each 0  . Calcium Carb-Cholecalciferol (CALCIUM 600 + D) 600-200 MG-UNIT TABS Take 1 tablet by mouth daily.     . cetirizine (ZYRTEC) 10 MG tablet  Take 1 tablet (10 mg total) by mouth daily. 30 tablet 11  . cholecalciferol (VITAMIN D3) 25 MCG (1000 UT) tablet Take 1,000 Units by mouth daily.    . Dulaglutide (TRULICITY) 3 SU/1.1SR SOPN Inject 0.5 mLs (3 mg total) into the skin once a week. 2 mL 6  . DULoxetine (CYMBALTA) 60 MG capsule Take 1 capsule (60 mg total) by mouth daily. 90 capsule 1  . ELIQUIS 2.5 MG TABS tablet TAKE 1 TABLET BY MOUTH TWICE DAILY 180 tablet 1  . empagliflozin (JARDIANCE) 25 MG TABS tablet Take 25 mg by mouth daily.    [name] gabapentin (NEURONTIN) 300 MG capsule Take 1 capsule (300 mg total) by mouth 3 (three) times daily. 270 capsule 3  . glipiZIDE (GLUCOTROL) 5 MG tablet TAKE ONE TABLET BY MOUTH EVERY DAY BEFORE BREAKFAST 90 tablet 1  . insulin degludec (TRESIBA FLEXTOUCH) 100 UNIT/ML FlexTouch Pen Inject 0.24 mLs (24 Units total) into the skin daily. 12 mL 1  . Insulin Pen Needle (PEN NEEDLES 3/16") 31G X 5 MM MISC 1 each by Does not apply route daily. 100 each 12  . isosorbide mononitrate (IMDUR) 60 MG 24 hr tablet TAKE ONE TABLET BY MOUTH AT BEDTIME AND HALF A TABLET EVERY MORNING 135 tablet 3  . Meth-Hyo-M Bl-Na Phos-Ph Sal (URIBEL) 118 MG CAPS Take 1 capsule (118 mg total) by mouth 3 (three) times daily as needed (Urinary frequency, urgency, burning). 30 capsule 3  . metoprolol succinate (TOPROL-XL) 50 MG 24 hr tablet TAKE ONE TABLET EVERY DAY WITH OR IMMEDIATELY FOLLOWING A MEAL 90 tablet 1  . mometasone (  ELOCON) 0.1 % lotion Apply topically as directed. Qd up to 5 days a week to aa scalp until clear, then prn flares 60 mL 1  . montelukast (SINGULAIR) 10 MG tablet Take 1 tablet (10 mg total) by mouth at bedtime. 90 tablet 1  . Multiple Vitamins-Minerals (ZINC PO) Take 50 mg by mouth daily.     [name] MYRBETRIQ 50 MG TB24 tablet TAKE ONE TABLET EVERY DAY 30 tablet 2  . nitroGLYCERIN (NITROSTAT) 0.4 MG SL tablet Place 1 tablet (0.4 mg total) under the tongue every 5 (five) minutes as needed for chest pain. 25 tablet 6  .  olopatadine (PATADAY) 0.1 % ophthalmic solution Place 1 drop into both eyes 2 (two) times daily. 5 mL 12  . pantoprazole (PROTONIX) 40 MG tablet Take 1 tablet (40 mg total) by mouth daily as needed. 90 tablet 1  . Polyethylene Glycol 400 (BLINK TEARS) 0.25 % SOLN Place 1 drop into both eyes 3 (three) times daily as needed (dry/irritated eyes.).     [name] PROAIR HFA 108 (90 Base) MCG/ACT inhaler TAKE 2 PUFFS EVERY 4 HOURS AS NEEDED FORWHEEZING OR SHORTNESS OF BREATH 8.5 g 3  . rosuvastatin (CRESTOR) 40 MG tablet Take 1 tablet (40 mg total) by mouth daily. 90 tablet 1  . senna (SENOKOT) 8.6 MG TABS tablet SMARTSIG:2 Tablet(s) By Mouth Every 12 Hours PRN    . SYNTHROID 150 MCG tablet TAKE 1 TABLET EVERY DAY ON EMPTY STOMACHWITH A GLASS OF WATER AT LEAST 30-60 MINBEFORE BREAKFAST 30 tablet 2  . trimethoprim (TRIMPEX) 100 MG tablet Take 100 mg by mouth daily.    [name] ketoconazole (NIZORAL) 2 % shampoo Apply 1 application topically 4 (four) times a week. Wash scalp 4 times weekly, let sit 5 minutes and rinse out (Patient not taking: Reported on 02/16/2020) 120 mL 4   No current facility-administered medications for this visit.    Patient confirms/reports the following allergies:  Allergies  Allergen Reactions  . Hydrocodone Other (See Comments)    Unresponsive  . Aleve [Naproxen Sodium]     Hives & itching   . Contrast Media [Iodinated Diagnostic Agents]     Pt avoids due to kidney issues  . Ioxaglate Other (See Comments)    (iodine) Pt avoids due to kidney issues   . Oxycodone Other (See Comments)    hallucinations  . Ranexa [Ranolazine]     Sick on stomach  . Sulfa Antibiotics Other (See Comments)    Pt avoids due to kidney issues  . Tramadol Other (See Comments)    nightmares    No orders of the defined types were placed in this encounter.   AUTHORIZATION INFORMATION Primary Insurance: 1D#: Group #:  Secondary Insurance: 1D#: Group #:  SCHEDULE INFORMATION: Date:  04/02/20 Time: Location:[facility]

## 2020-02-17 DIAGNOSIS — Z96659 Presence of unspecified artificial knee joint: Secondary | ICD-10-CM | POA: Diagnosis not present

## 2020-02-18 DIAGNOSIS — I129 Hypertensive chronic kidney disease with stage 1 through stage 4 chronic kidney disease, or unspecified chronic kidney disease: Secondary | ICD-10-CM | POA: Diagnosis not present

## 2020-02-18 DIAGNOSIS — E1122 Type 2 diabetes mellitus with diabetic chronic kidney disease: Secondary | ICD-10-CM | POA: Diagnosis not present

## 2020-02-18 DIAGNOSIS — E875 Hyperkalemia: Secondary | ICD-10-CM | POA: Diagnosis not present

## 2020-02-18 DIAGNOSIS — R809 Proteinuria, unspecified: Secondary | ICD-10-CM | POA: Diagnosis not present

## 2020-02-18 DIAGNOSIS — N1832 Chronic kidney disease, stage 3b: Secondary | ICD-10-CM | POA: Diagnosis not present

## 2020-02-20 ENCOUNTER — Telehealth: Payer: Self-pay | Admitting: Cardiovascular Disease

## 2020-02-20 DIAGNOSIS — Z86010 Personal history of colonic polyps: Secondary | ICD-10-CM

## 2020-02-20 NOTE — Telephone Encounter (Signed)
Pharmacy, can you please comment on how long Eliquis can be held for upcoming colonoscopy?  Thank you! 

## 2020-02-20 NOTE — Telephone Encounter (Signed)
Patient with diagnosis of PE in 2015 and DVT (unknown date) on Eliquis for anticoagulation.    Procedure: colonoscopy Date of procedure: 04/02/20  CrCl 82mL/min Platelet count 303K  Per office protocol, patient can hold Eliquis for 1 day prior to procedure.

## 2020-02-20 NOTE — Telephone Encounter (Signed)
   [facility] Pre-operative Risk Assessment    HEARTCARE STAFF: - Please ensure there is not already an duplicate clearance open for this procedure. - Under Visit Info/Reason for Call, type in Other and utilize the format Clearance MM/DD/YY or Clearance TBD. Do not use dashes or single digits. - If request is for dental extraction, please clarify the # of teeth to be extracted.  Request for surgical clearance:  1. What type of surgery is being performed? Colonoscopy    2. When is this surgery scheduled? 04-02-20   3. What type of clearance is required (medical clearance vs. Pharmacy clearance to hold med vs. Both)? both  4. Are there any medications that need to be held prior to surgery and how long?please advise    5. Practice name and name of physician performing surgery? [facility]  Dr. Allen Norris  6. What is the office phone number? [phone]    7.   What is the office fax number? [phone]  8.   Anesthesia type (None, local, MAC, general) ? Not noted    Clarisse Gouge 02/20/2020, 3:01 PM  _________________________________________________________________   (provider comments below)

## 2020-02-23 NOTE — Telephone Encounter (Signed)
   Primary Cardiologist: Ida Rogue, MD  Chart reviewed as part of pre-operative protocol coverage. Patient was contacted 02/23/2020 in reference to pre-operative risk assessment for pending surgery as outlined below.  Tammy [name] was last seen on 08/01/19 by Marrianne Mood PAC.  Since that day, Tammy [name] has done well.  She did well following TKA 3 months ago. She can now climb a flight of stairs if holding the rail. Heart catheterization January 2021 showed total occlusion of RCA with collaterals. She denies angina.   Per our clinical pharmacist: Patient with diagnosis of PE in 2015 and DVT (unknown date) on Eliquis for anticoagulation.    Procedure: colonoscopy Date of procedure: 04/02/20  CrCl 33mL/min Platelet count 303K  Per office protocol, patient can hold Eliquis for 1 day prior to procedure.   Therefore, based on ACC/AHA guidelines, the patient would be at acceptable risk for the planned procedure without further cardiovascular testing.   The patient was advised that if she develops new symptoms prior to surgery to contact our office to arrange for a follow-up visit, and she verbalized understanding.  I will route this recommendation to the requesting party via Epic fax function and remove from pre-op pool. Please call with questions.  [place], PA 02/23/2020, 4:02 PM

## 2020-02-27 ENCOUNTER — Telehealth: Payer: Self-pay | Admitting: *Deleted

## 2020-02-27 NOTE — Telephone Encounter (Signed)
Pt has been made aware of upcoming appts for follow up CT scan and follow up appt with Faythe Casa, NP in the [facility]. Pt verbalized understanding. Nothing further needed at this time.

## 2020-03-01 ENCOUNTER — Other Ambulatory Visit: Payer: Self-pay | Admitting: Family Medicine

## 2020-03-03 ENCOUNTER — Ambulatory Visit: Payer: Self-pay | Admitting: General Practice

## 2020-03-03 ENCOUNTER — Telehealth: Payer: Medicare Other | Admitting: General Practice

## 2020-03-03 DIAGNOSIS — E782 Mixed hyperlipidemia: Secondary | ICD-10-CM

## 2020-03-03 DIAGNOSIS — I25118 Atherosclerotic heart disease of native coronary artery with other forms of angina pectoris: Secondary | ICD-10-CM

## 2020-03-03 DIAGNOSIS — I129 Hypertensive chronic kidney disease with stage 1 through stage 4 chronic kidney disease, or unspecified chronic kidney disease: Secondary | ICD-10-CM

## 2020-03-03 DIAGNOSIS — E1122 Type 2 diabetes mellitus with diabetic chronic kidney disease: Secondary | ICD-10-CM

## 2020-03-03 DIAGNOSIS — Z794 Long term (current) use of insulin: Secondary | ICD-10-CM

## 2020-03-03 DIAGNOSIS — N183 Chronic kidney disease, stage 3 unspecified: Secondary | ICD-10-CM

## 2020-03-03 NOTE — Patient Instructions (Signed)
Visit Information  Goals Addressed            This Visit's Progress   . COMPLETED: RNCM: Manage Pain       Follow Up per outreach   - develop a personal pain management plan - plan exercise or activity when pain is best controlled - prioritize tasks for the day - track times pain is worst and when it is best - track what makes the pain worse and what makes it better    Why is this important?   Day-to-day life can be hard when you have chronic pain.  Pain medicine is just one piece of the treatment puzzle.  You can try these action steps to help you manage your pain.    Notes: Currently working with PT. PT completed. Patient is doing well.    [name] RNCM: Monitor and Manage My Blood Sugar       Follow Up at each outreach   - check blood sugar at prescribed times - check blood sugar if I feel it is too high or too low - take the blood sugar log to all doctor visits    Why is this important?   Checking your blood sugar at home helps to keep it from getting very high or very low.  Writing the results in a diary or log helps the doctor know how to care for you.  Your blood sugar log should have the time, date and the results.  Also, write down the amount of insulin or other medicine that you take.  Other information, like what you ate, exercise done and how you were feeling, will also be helpful.     Notes: Is compliant with blood sugar checks. Current readings 100-150 Most recent hemoglobin A1C 7.0 on 02-10-2020    . RNCM: Set My Target A1C       Follow Up Date on each outreach   - set target A1C    Why is this important?   Your target A1C is decided together by you and your doctor.  It is based on several things like your age and other health issues.    Notes: the patients current hemoglobin A1C is 7.7 on 11-11-2019 Most recent hemoglobin A1C 7.0 on 02-10-2020    . RNCM: Track and Manage My Blood Pressure       Follow Up per outreach  - check blood pressure 3 times per  week - write blood pressure results in a log or diary    Why is this important?   You won't feel high blood pressure, but it can still hurt your blood vessels.  High blood pressure can cause heart or kidney problems. It can also cause a stroke.  Making lifestyle changes like losing a little weight or eating less salt will help.  Checking your blood pressure at home and at different times of the day can help to control blood pressure.  If the doctor prescribes medicine remember to take it the way the doctor ordered.  Call the office if you cannot afford the medicine or if there are questions about it.     Notes: The patient is compliant with blood pressure management at this time. Doing well.        The patient verbalized understanding of instructions, educational materials, and care plan provided today and declined offer to receive copy of patient instructions, educational materials, and care plan.   Face to Face appointment with care management team member scheduled for: 05-12-2019  2:15pm  Noreene Larsson RN, MSN, [facility] Mobile: [phone]

## 2020-03-03 NOTE — Chronic Care Management (AMB) (Signed)
Chronic Care Management   Follow Up Note   03/03/2020 Name: Tammy [name] MRN: [id number] DOB: 05-Sep-1945  Referred by: [name], DO Reason for referral : Chronic Care Management (RNCM Follow up for Chronic Disease Management and Care Coordination Needs)   Tammy [name] is a 74 y.o. year old female who is a primary care patient of [name], DO. The CCM team was consulted for assistance with chronic disease management and care coordination needs.    Review of patient status, including review of consultants reports, relevant laboratory and other test results, and collaboration with appropriate care team members and the patient's provider was performed as part of comprehensive patient evaluation and provision of chronic care management services.    SDOH (Social Determinants of Health) assessments performed: Yes See Care Plan activities for detailed interventions related to [facility])     Outpatient Encounter Medications as of 03/03/2020  Medication Sig  . anastrozole (ARIMIDEX) 1 MG tablet Take 1 tablet (1 mg total) by mouth daily.  . blood glucose meter kit and supplies KIT Dispense based on patient and insurance preference. Use one time daily as directed, E11.9  . Calcium Carb-Cholecalciferol (CALCIUM 600 + D) 600-200 MG-UNIT TABS Take 1 tablet by mouth daily.   . cetirizine (ZYRTEC) 10 MG tablet Take 1 tablet (10 mg total) by mouth daily.  . cholecalciferol (VITAMIN D3) 25 MCG (1000 UT) tablet Take 1,000 Units by mouth daily.  . Dulaglutide (TRULICITY) 3 TY/6.0AY SOPN Inject 0.5 mLs (3 mg total) into the skin once a week.  . DULoxetine (CYMBALTA) 60 MG capsule TAKE 1 CAPSULE BY MOUTH DAILY  . ELIQUIS 2.5 MG TABS tablet TAKE 1 TABLET BY MOUTH TWICE DAILY  . empagliflozin (JARDIANCE) 25 MG TABS tablet Take 25 mg by mouth daily.  [name] gabapentin (NEURONTIN) 300 MG capsule Take 1 capsule (300 mg total) by mouth 3 (three) times daily.  [name] glipiZIDE (GLUCOTROL) 5 MG tablet TAKE ONE TABLET  BY MOUTH EVERY DAY BEFORE BREAKFAST  . insulin degludec (TRESIBA FLEXTOUCH) 100 UNIT/ML FlexTouch Pen Inject 0.24 mLs (24 Units total) into the skin daily.  . Insulin Pen Needle (PEN NEEDLES 3/16") 31G X 5 MM MISC 1 each by Does not apply route daily.  . isosorbide mononitrate (IMDUR) 60 MG 24 hr tablet TAKE ONE TABLET BY MOUTH AT BEDTIME AND HALF A TABLET EVERY MORNING  . ketoconazole (NIZORAL) 2 % shampoo Apply 1 application topically 4 (four) times a week. Wash scalp 4 times weekly, let sit 5 minutes and rinse out (Patient not taking: Reported on 02/16/2020)  . Meth-Hyo-M Bl-Na Phos-Ph Sal (URIBEL) 118 MG CAPS Take 1 capsule (118 mg total) by mouth 3 (three) times daily as needed (Urinary frequency, urgency, burning).  . metoprolol succinate (TOPROL-XL) 50 MG 24 hr tablet TAKE ONE TABLET EVERY DAY WITH OR IMMEDIATELY FOLLOWING A MEAL  . mometasone (ELOCON) 0.1 % lotion Apply topically as directed. Qd up to 5 days a week to aa scalp until clear, then prn flares  . montelukast (SINGULAIR) 10 MG tablet Take 1 tablet (10 mg total) by mouth at bedtime.  . Multiple Vitamins-Minerals (ZINC PO) Take 50 mg by mouth daily.   [name] MYRBETRIQ 50 MG TB24 tablet TAKE ONE TABLET EVERY DAY  . nitroGLYCERIN (NITROSTAT) 0.4 MG SL tablet Place 1 tablet (0.4 mg total) under the tongue every 5 (five) minutes as needed for chest pain.  [name] olopatadine (PATADAY) 0.1 % ophthalmic solution Place 1 drop into both eyes  2 (two) times daily.  . pantoprazole (PROTONIX) 40 MG tablet Take 1 tablet (40 mg total) by mouth daily as needed.  . Polyethylene Glycol 400 (BLINK TEARS) 0.25 % SOLN Place 1 drop into both eyes 3 (three) times daily as needed (dry/irritated eyes.).   [name] PROAIR HFA 108 (90 Base) MCG/ACT inhaler TAKE 2 PUFFS EVERY 4 HOURS AS NEEDED FORWHEEZING OR SHORTNESS OF BREATH  . rosuvastatin (CRESTOR) 40 MG tablet TAKE 1 TABLET BY MOUTH ONCE DAILY  . senna (SENOKOT) 8.6 MG TABS tablet SMARTSIG:2 Tablet(s) By Mouth Every 12  Hours PRN  . SYNTHROID 150 MCG tablet TAKE 1 TABLET EVERY DAY ON EMPTY STOMACHWITH A GLASS OF WATER AT LEAST 30-60 MINBEFORE BREAKFAST  . trimethoprim (TRIMPEX) 100 MG tablet Take 100 mg by mouth daily.   No facility-administered encounter medications on file as of 03/03/2020.     Objective:   Goals Addressed            This Visit's Progress   . COMPLETED: RNCM: Manage Pain       Follow Up per outreach   - develop a personal pain management plan - plan exercise or activity when pain is best controlled - prioritize tasks for the day - track times pain is worst and when it is best - track what makes the pain worse and what makes it better    Why is this important?   Day-to-day life can be hard when you have chronic pain.  Pain medicine is just one piece of the treatment puzzle.  You can try these action steps to help you manage your pain.    Notes: Currently working with PT. PT completed. Patient is doing well.    [name] RNCM: Monitor and Manage My Blood Sugar       Follow Up at each outreach   - check blood sugar at prescribed times - check blood sugar if I feel it is too high or too low - take the blood sugar log to all doctor visits    Why is this important?   Checking your blood sugar at home helps to keep it from getting very high or very low.  Writing the results in a diary or log helps the doctor know how to care for you.  Your blood sugar log should have the time, date and the results.  Also, write down the amount of insulin or other medicine that you take.  Other information, like what you ate, exercise done and how you were feeling, will also be helpful.     Notes: Is compliant with blood sugar checks. Current readings 100-150 Most recent hemoglobin A1C 7.0 on 02-10-2020    . RNCM: Set My Target A1C       Follow Up Date on each outreach   - set target A1C    Why is this important?   Your target A1C is decided together by you and your doctor.  It is based on  several things like your age and other health issues.    Notes: the patients current hemoglobin A1C is 7.7 on 11-11-2019 Most recent hemoglobin A1C 7.0 on 02-10-2020    . RNCM: Track and Manage My Blood Pressure       Follow Up per outreach  - check blood pressure 3 times per week - write blood pressure results in a log or diary    Why is this important?   You won't feel high blood pressure, but it can still hurt your  blood vessels.  High blood pressure can cause heart or kidney problems. It can also cause a stroke.  Making lifestyle changes like losing a little weight or eating less salt will help.  Checking your blood pressure at home and at different times of the day can help to control blood pressure.  If the doctor prescribes medicine remember to take it the way the doctor ordered.  Call the office if you cannot afford the medicine or if there are questions about it.     Notes: The patient is compliant with blood pressure management at this time. Doing well.        Patient Care Plan: Chronic Pain (Adult)    Problem Identified: RNCM: Pain Management Plan (Chronic Pain)   Priority: High  Note:   CARE PLAN ENTRY (see longitudinal plan of care for additional care plan information)  Current Barriers:  [name] Knowledge Deficits related to how to best control post operative knee replacement pain- right knee . Chronic Disease Management support and education needs related to chronic pain and discomfort related to knee pain and post surgical knee replacement surgery  Nurse Case Manager Clinical Goal(s):  [name] Over the next 120 days, patient will verbalize understanding of plan for pain management for knee pain . Over the next 120 days, patient will attend all scheduled medical appointments: next appointment with the pcp on 02-10-2020 at 2:20 pm. . Over the next 120 days, patient will demonstrate improved adherence to prescribed treatment plan for pain relief of post surgical right knee pain  as evidenced bypain relief and increased activity level . Over the next 120 days, patient will demonstrate improved health management independence as evidenced byrecovery from knee replacement surgery   Interventions:  . Inter-disciplinary care team collaboration (see longitudinal plan of care) . Evaluation of current treatment plan related to pain control and patient's adherence to plan as established by provider. . Advised patient to call provider for changes in level or intensity of pain and discomfort . Provided education to patient re: alternative methods of pain control and to continue to work with PT to manage pain and discomfort.  . Reviewed medications with patient and discussed patient states opioids make her sick on her stomach. Does not like to take them but will for uncontrolled pain.  . Reviewed scheduled/upcoming provider appointments including: 02-10-2020 at 2:20 pm  Patient Self Care Activities:  . Patient verbalizes understanding of plan to work with PT and call the provider for changes in pain level . Attends all scheduled provider appointments . Calls provider office for new concerns or questions . Unable to independently manage pain after post surgical right knee replacement  . Unable to perform IADLs independently  Initial goal documentation    Long-Range Goal: Pain Management Plan Developed   Expected End Date: 03/11/2020  Priority: High  Note:   Notes: The patient has adequate pain medications for control of post op right knee replacement surgery.  03-03-2020: The patient has completed her PT. Is having some popping at times in her knee and wants to have it checked out after the holidays.    Task: Partner to Develop Chronic Pain Management Plan   Note:   Care Management Activities:    - pain assessed - pain treatment goals reviewed - patient response to treatment assessed    Notes: The patients pain is controlled at this time   Problem Identified: Chronic  Pain Management (Chronic Pain)     Goal: RNCM: Chronic Pain Managed Completed  03/03/2020  Note:   Pain controlled. No new concerns   Note:     Problem Identified: Harm or Injury (Chronic Pain)     Goal: RNCM: Harm or Injury Prevented Completed 03/03/2020  Expected End Date: 02/10/2020  Note:   Notes: The patient is working with PT and has not had any falls post surgery of right knee replacement. 03-03-2020: The patient has completed her PT and is doing well.    Note:   Care Management Activities:   Patient Care Plan: Hypertension (Adult)    Problem Identified: Hypertension (Hypertension)     Goal: RNCM: Hypertension Monitored   Note:   Objective:  . Last practice recorded BP readings:  BP Readings from Last 3 Encounters:  02/10/20 108/70  01/19/20 126/65  12/03/19 117/78 .   [name] Most recent eGFR/CrCl: No results found for: EGFR  No components found for: CRCL Current Barriers:  [name] Knowledge Deficits related to basic understanding of hypertension and hyperlipidemia pathophysiology and self care management . Unable to independently always follow a heart healthy/ADA diet Case Manager Clinical Goal(s):  [name] Over the next 120 days, patient will verbalize understanding of plan for hypertension management . Over the next 120 days, patient will attend all scheduled medical appointments: 05-11-2020 at 2:20 pm . Over the next 120 days, patient will demonstrate improved adherence to prescribed treatment plan for hypertension as evidenced by taking all medications as prescribed, monitoring and recording blood pressure as directed, adhering to low sodium/DASH diet . Over the next 120 days, patient will demonstrate improved health management independence as evidenced by checking blood pressure as directed and notifying PCP if SBP>160 or DBP > 90, taking all medications as prescribe, and adhering to a low sodium diet as discussed. Interventions:  . Evaluation of current treatment plan related to  hypertension self management and patient's adherence to plan as established by provider. . Provided education to patient re: stroke prevention, s/s of heart attack and stroke, DASH diet, complications of uncontrolled blood pressure . Discussed plans with patient for ongoing care management follow up and provided patient with direct contact information for care management team . Advised patient, providing education and rationale, to monitor blood pressure daily and record, calling PCP for findings outside established parameters.  . Reviewed scheduled/upcoming provider appointments including: 05-11-2020 at 2:20 pm . - blood pressure trends reviewed . - home or ambulatory blood pressure monitoring encouraged Patient Goals/Self-Care Activities . Over the next 120 days, patient will:  - Follows a low sodium diet/DASH diet Follow Up Plan: Face to Face appointment with care management team member scheduled for:   05-11-2020   Task: RNCM: Identify and Monitor Blood Pressure Elevation   Note:   Care Management Activities:    - blood pressure trends reviewed - home or ambulatory blood pressure monitoring encouraged       Problem Identified: Disease Progression (Hypertension)     Problem Identified: Resistant Hypertension (Hypertension)     Patient Care Plan: Diabetes Type 2 (Adult)    Problem Identified: RNCM: Glycemic Management (Diabetes, Type 2)   Priority: Medium  Note:   CARE PLAN ENTRY (see longtitudinal plan of care for additional care plan information)  Objective:  Lab Results  Component Value Date   HGBA1C 7.7 (H) 11/11/2019 .   Lab Results  Component Value Date   CREATININE 1.61 (H) 11/27/2019   CREATININE 1.77 (H) 11/11/2019   CREATININE 1.49 (H) 09/02/2019 .   [name] No results found for: EGFR  Current Barriers:  .  Knowledge Deficits related to basic Diabetes pathophysiology and self care/management . Knowledge Deficits related to medications used for management of  diabetes  Case Manager Clinical Goal(s):  Over the next 120 days, patient will demonstrate improved adherence to prescribed treatment plan for diabetes self care/management as evidenced by:  . daily monitoring and recording of CBG  . adherence to ADA/ carb modified diet . exercise 3/4 days/week . adherence to prescribed medication regimen  Interventions:  . Provided education to patient about basic DM disease process . Reviewed medications with patient and discussed importance of medication adherence . Discussed plans with patient for ongoing care management follow up and provided patient with direct contact information for care management team . Provided patient with written educational materials related to hypo and hyperglycemia and importance of correct treatment . Reviewed scheduled/upcoming provider appointments including: 02-10-2020 at 2:20 pm . Advised patient, providing education and rationale, to check cbg BID and record, calling pcp for findings outside established parameters.   . Review of patient status, including review of consultants reports, relevant laboratory and other test results, and medications completed.  Patient Self Care Activities:  . UNABLE to independently manage DM as evidence of hemoglobin A1C of 7.7 on 11-11-2019  Initial goal documentation    Goal: RNCM: Glycemic Management Optimized   Note:   Objective:  Lab Results  Component Value Date   HGBA1C 7.0 (H) 02/10/2020 .   Lab Results  Component Value Date   CREATININE 1.75 (H) 02/10/2020   CREATININE 1.70 (H) 01/19/2020   CREATININE 1.61 (H) 11/27/2019 .   [name] No results found for: EGFR Current Barriers:  [name] Knowledge Deficits related to basic Diabetes pathophysiology and self care/management . Knowledge Deficits related to medications used for management of diabetes . Unable to independently manage DM as evidence of elevated A1C at times, better control now and most recent 7.0 Case Manager Clinical  Goal(s):  [name] Over the next 120 days, patient will demonstrate improved adherence to prescribed treatment plan for diabetes self care/management as evidenced by:  . daily monitoring and recording of CBG  . adherence to ADA/ carb modified diet . adherence to prescribed medication regimen Interventions:  . Provided education to patient about basic DM disease process . Discussed plans with patient for ongoing care management follow up and provided patient with direct contact information for care management team . Provided patient with written educational materials related to hypo and hyperglycemia and importance of correct treatment . Reviewed scheduled/upcoming provider appointments including: 05-11-2020 at 2:20 pm . Advised patient, providing education and rationale, to check cbg TID and record, calling pcp for findings outside established parameters.   . Review of patient status, including review of consultants reports, relevant laboratory and other test results, and medications completed. Patient Goals/Self-Care Activities . Over the next 120 days, patient will:  - Checks blood sugars as prescribed and utilize hyper and hypoglycemia protocol as needed Adheres to prescribed ADA/carb modified Follow Up Plan:  Face to Face appointment with care management team member scheduled for:  05-11-2020   Task: RNCM: Alleviate Barriers to Glycemic Management   Note:   Care Management Activities:    - A1C testing facilitated - barriers to adherence to treatment plan identified - blood glucose monitoring encouraged - blood glucose readings reviewed - mutual A1C goal set or reviewed - self-awareness of signs/symptoms of hypo or hyperglycemia encouraged - use of blood glucose monitoring log promoted    Notes: the patient is currently compliant with DM management  Problem Identified: Disease Progression (Diabetes, Type 2)       Plan:   Face to Face appointment with care management team member  scheduled for:  05-11-2020 at 2:15 pm   Noreene Larsson RN, MSN, [facility] Mobile: [phone]

## 2020-03-04 ENCOUNTER — Other Ambulatory Visit: Payer: Self-pay | Admitting: Family Medicine

## 2020-03-04 ENCOUNTER — Other Ambulatory Visit: Payer: Self-pay | Admitting: Cardiovascular Disease

## 2020-03-04 ENCOUNTER — Other Ambulatory Visit: Payer: Self-pay | Admitting: *Deleted

## 2020-03-04 ENCOUNTER — Encounter: Payer: Self-pay | Admitting: Internal Medicine

## 2020-03-04 ENCOUNTER — Encounter: Payer: Self-pay | Admitting: Family Medicine

## 2020-03-04 MED ORDER — ANASTROZOLE 1 MG PO TABS
1.0000 mg | ORAL_TABLET | Freq: Every day | ORAL | 4 refills | Status: DC
Start: 2020-03-04 — End: 2020-08-30

## 2020-03-04 NOTE — Telephone Encounter (Signed)
   Notes to clinic:  medication filled by historical provider  Review for refill    Requested Prescriptions  Pending Prescriptions Disp Refills   trimethoprim (TRIMPEX) 100 MG tablet [Pharmacy Med Name: TRIMETHOPRIM 100 MG TAB] 90 tablet     Sig: TAKE ONE TABLET BY MOUTH EVERY DAY      Off-Protocol Failed - 03/04/2020  3:13 PM      Failed - Medication not assigned to a protocol, review manually.      Passed - Valid encounter within last 12 months    Recent Outpatient Visits           3 weeks ago Type 2 diabetes mellitus with stage 3 chronic kidney disease, with long-term current use of insulin, unspecified whether stage 3a or 3b CKD ([place])   [place], Megan P, DO   3 months ago Wellness examination   [employer], Megan P, DO   6 months ago Uncontrolled type 2 diabetes mellitus with complication, without long-term current use of insulin ([place])   [place], Megan P, DO   7 months ago Uncontrolled type 2 diabetes mellitus with complication, without long-term current use of insulin ([place])   [place], Megan P, DO   7 months ago Pleural effusion   [place], [place], DO       Future Appointments             In 2 months Johnson, Barb Merino, DO [facility], [facility]   In 9 months Stoioff, Ronda Fairly, MD [facility]   In 9 months  [employer], [place]

## 2020-03-04 NOTE — Telephone Encounter (Signed)
Eliquis 2.5mg  refill request received. Patient is 74 years old, weight-95.4kg, Crea-1.75 on 02/10/2020, Diagnosis-PE, DVT, and last seen by Dr [name] on 09/08/2019. Dose is appropriate based on dosing criteria. Will send in refill to requested pharmacy.   Per 09/08/2019 OV Note with [place] : History of DVT and PE Continue Eliquis dosing down to 2.5 mg twice daily for prophylactic dosing

## 2020-03-04 NOTE — Telephone Encounter (Signed)
Please review for refill. Thanks!  

## 2020-03-08 ENCOUNTER — Other Ambulatory Visit: Payer: Self-pay | Admitting: Family Medicine

## 2020-03-08 NOTE — Telephone Encounter (Signed)
Routing to provider  

## 2020-03-08 NOTE — Telephone Encounter (Signed)
Is this an ongoing prescription?

## 2020-03-08 NOTE — Telephone Encounter (Signed)
Patient would like medication request expedited and a callback once medication has been sent to pharmacy.

## 2020-03-08 NOTE — Telephone Encounter (Signed)
Patient notified

## 2020-03-08 NOTE — Telephone Encounter (Signed)
I will refill these for her- but please let her know that the reason they were not filled is because they come from her urologist.

## 2020-03-10 ENCOUNTER — Other Ambulatory Visit: Payer: Self-pay | Admitting: Family Medicine

## 2020-03-10 NOTE — Telephone Encounter (Signed)
Notes to clinic:  medication filled by a historical provider  Review for refill    Requested Prescriptions  Pending Prescriptions Disp Refills   JARDIANCE 25 MG TABS tablet [Pharmacy Med Name: JARDIANCE 25 MG TAB] 90 tablet     Sig: TAKE ONE TABLET 25MG [place]      Endocrinology:  Diabetes - SGLT2 Inhibitors Failed - 03/10/2020  5:04 PM      Failed - Cr in normal range and within 360 days    Creatinine  Date Value Ref Range Status  09/20/2011 1.31 (H) 0.60 - 1.30 mg/dL Final   Creatinine, Ser  Date Value Ref Range Status  02/10/2020 1.75 (H) 0.57 - 1.00 mg/dL Final          Failed - LDL in normal range and within 360 days    LDL Chol Calc (NIH)  Date Value Ref Range Status  11/11/2019 53 0 - 99 mg/dL Final          Failed - eGFR in normal range and within 360 days    EGFR (African American)  Date Value Ref Range Status  09/20/2011 48 (L)  Final   GFR calc Af Amer  Date Value Ref Range Status  02/10/2020 32 (L) >59 mL/min/1.73 Final    Comment:    **In accordance with recommendations from the NKF-ASN Task force,**   [facility] is in the process of updating its eGFR calculation to the   2021 CKD-EPI creatinine equation that estimates kidney function   without a race variable.    EGFR (Non-African Amer.)  Date Value Ref Range Status  09/20/2011 42 (L)  Final    Comment:    eGFR values <76m/min/1.73 m2 may be an indication of chronic kidney disease (CKD). Calculated eGFR is useful in patients with stable renal function. The eGFR calculation will not be reliable in acutely ill patients when serum creatinine is changing rapidly. It is not useful in  patients on dialysis. The eGFR calculation may not be applicable to patients at the low and high extremes of body sizes, pregnant women, and vegetarians.    GFR, Estimated  Date Value Ref Range Status  01/19/2020 31 (L) >60 mL/min Final    Comment:    (NOTE) Calculated using the CKD-EPI  Creatinine Equation (2021)    GFR calc non Af Amer  Date Value Ref Range Status  02/10/2020 28 (L) >59 mL/min/1.73 Final          Passed - HBA1C is between 0 and 7.9 and within 180 days    Hemoglobin A1C  Date Value Ref Range Status  01/31/2016 8.7  Final   HB A1C (BAYER DCA - WAIVED)  Date Value Ref Range Status  02/10/2020 7.0 (H) <7.0 % Final    Comment:                                          Diabetic Adult            <7.0                                       Healthy Adult        4.3 - 5.7                                                           (  DCCT/NGSP) American Diabetes Association's Summary of Glycemic Recommendations for Adults with Diabetes: Hemoglobin A1c <7.0%. More stringent glycemic goals (A1c <6.0%) may further reduce complications at the cost of increased risk of hypoglycemia.           Passed - Valid encounter within last 6 months    Recent Outpatient Visits           4 weeks ago Type 2 diabetes mellitus with stage 3 chronic kidney disease, with long-term current use of insulin, unspecified whether stage 3a or 3b CKD ([place])   [place], Megan P, DO   4 months ago Wellness examination   [employer], Megan P, DO   6 months ago Uncontrolled type 2 diabetes mellitus with complication, without long-term current use of insulin ([place])   [place], Megan P, DO   7 months ago Uncontrolled type 2 diabetes mellitus with complication, without long-term current use of insulin ([place])   [place], [place], DO   7 months ago Pleural effusion   [place], [place], DO       Future Appointments             In 2 months Johnson, Barb Merino, DO [facility]   In 8 months Stoioff, Ronda Fairly, MD [facility]   In 9 months  [facility] [facility], [place]

## 2020-03-11 NOTE — Telephone Encounter (Signed)
Patient last seen 02/10/20 and has appointment 05/12/19

## 2020-03-23 ENCOUNTER — Ambulatory Visit
Admission: RE | Admit: 2020-03-23 | Discharge: 2020-03-23 | Disposition: A | Discharge status: Home / Self Care | Payer: Medicare Other | Source: Ambulatory Visit | Attending: Oncology | Admitting: Oncology

## 2020-03-23 ENCOUNTER — Other Ambulatory Visit: Payer: Self-pay

## 2020-03-23 DIAGNOSIS — J984 Other disorders of lung: Secondary | ICD-10-CM | POA: Diagnosis not present

## 2020-03-23 DIAGNOSIS — R918 Other nonspecific abnormal finding of lung field: Secondary | ICD-10-CM

## 2020-03-23 DIAGNOSIS — I77811 Abdominal aortic ectasia: Secondary | ICD-10-CM | POA: Diagnosis not present

## 2020-03-23 DIAGNOSIS — I77810 Thoracic aortic ectasia: Secondary | ICD-10-CM | POA: Diagnosis not present

## 2020-03-23 DIAGNOSIS — J438 Other emphysema: Secondary | ICD-10-CM | POA: Diagnosis not present

## 2020-03-24 ENCOUNTER — Other Ambulatory Visit: Payer: Self-pay

## 2020-03-24 ENCOUNTER — Inpatient Hospital Stay: Payer: Medicare Other | Attending: Oncology | Admitting: Oncology

## 2020-03-24 ENCOUNTER — Encounter: Payer: Self-pay | Admitting: Gastroenterology

## 2020-03-24 DIAGNOSIS — Z86718 Personal history of other venous thrombosis and embolism: Secondary | ICD-10-CM | POA: Insufficient documentation

## 2020-03-24 DIAGNOSIS — Z923 Personal history of irradiation: Secondary | ICD-10-CM | POA: Insufficient documentation

## 2020-03-24 DIAGNOSIS — C50911 Malignant neoplasm of unspecified site of right female breast: Secondary | ICD-10-CM | POA: Insufficient documentation

## 2020-03-24 DIAGNOSIS — J449 Chronic obstructive pulmonary disease, unspecified: Secondary | ICD-10-CM | POA: Insufficient documentation

## 2020-03-24 DIAGNOSIS — E785 Hyperlipidemia, unspecified: Secondary | ICD-10-CM | POA: Diagnosis not present

## 2020-03-24 DIAGNOSIS — Z87891 Personal history of nicotine dependence: Secondary | ICD-10-CM | POA: Insufficient documentation

## 2020-03-24 DIAGNOSIS — Z780 Asymptomatic menopausal state: Secondary | ICD-10-CM | POA: Diagnosis not present

## 2020-03-24 DIAGNOSIS — Z803 Family history of malignant neoplasm of breast: Secondary | ICD-10-CM | POA: Diagnosis not present

## 2020-03-24 DIAGNOSIS — Z8673 Personal history of transient ischemic attack (TIA), and cerebral infarction without residual deficits: Secondary | ICD-10-CM | POA: Insufficient documentation

## 2020-03-24 DIAGNOSIS — Z8051 Family history of malignant neoplasm of kidney: Secondary | ICD-10-CM | POA: Insufficient documentation

## 2020-03-24 DIAGNOSIS — I129 Hypertensive chronic kidney disease with stage 1 through stage 4 chronic kidney disease, or unspecified chronic kidney disease: Secondary | ICD-10-CM | POA: Diagnosis not present

## 2020-03-24 DIAGNOSIS — R918 Other nonspecific abnormal finding of lung field: Secondary | ICD-10-CM | POA: Insufficient documentation

## 2020-03-24 DIAGNOSIS — I2510 Atherosclerotic heart disease of native coronary artery without angina pectoris: Secondary | ICD-10-CM | POA: Diagnosis not present

## 2020-03-24 DIAGNOSIS — Z9012 Acquired absence of left breast and nipple: Secondary | ICD-10-CM | POA: Diagnosis not present

## 2020-03-24 DIAGNOSIS — Z79811 Long term (current) use of aromatase inhibitors: Secondary | ICD-10-CM | POA: Diagnosis not present

## 2020-03-24 DIAGNOSIS — N183 Chronic kidney disease, stage 3 unspecified: Secondary | ICD-10-CM | POA: Diagnosis not present

## 2020-03-24 DIAGNOSIS — Z800 Family history of malignant neoplasm of digestive organs: Secondary | ICD-10-CM | POA: Diagnosis not present

## 2020-03-24 DIAGNOSIS — Z801 Family history of malignant neoplasm of trachea, bronchus and lung: Secondary | ICD-10-CM | POA: Diagnosis not present

## 2020-03-24 DIAGNOSIS — Z96651 Presence of right artificial knee joint: Secondary | ICD-10-CM | POA: Insufficient documentation

## 2020-03-24 NOTE — Addendum Note (Signed)
Addended by: Faythe Casa E on: 03/24/2020 04:11 PM   Modules accepted: Orders

## 2020-03-24 NOTE — Progress Notes (Signed)
Pulmonary Nodule Clinic Consult note [facility]  [phone] [phone]  Patient Care Team: [name], DO as PCP - General (Family Medicine) [name] [name], MD as PCP - Cardiology (Cardiology) [name] [name], MD as Consulting Physician (Cardiology) [name], MD as Consulting Physician (Urology) [name], MD as Consulting Physician (Nephrology) [name], MD as Referring Physician (Orthopedic Surgery) [name], MD as Attending Physician (Endocrinology) [name], MD as Referring Physician (Vascular Surgery) [name], [facility] as Pharmacist (Pharmacist) [name], RN as Case Manager (General Practice)   Name of the patient: Tammy [name]  [id number]  1945-07-10   Date of visit: 03/24/2020   Diagnosis- Lung Nodule  Chief complaint/ Reason for visit- Pulmonary Nodule Clinic Initial Visit  Past Medical History: Tammy [name] is a 75 year old female with past medical history significant for CAD, COPD, CKD stage III, hypertension, hyperlipidemia, history of breast cancer who recently had a CT chest for a fall causing left-sided rib pain and shortness of breath.  Imaging showed no evidence of traumatic injury within the chest but incidentally showed a 7 mm subpleural nodule which is increased in size compared to previously noted lung nodule from 03/01/2018 (65m).  Follow-up in 3 to 6 months was recommended to ensure stability.  Interval history- She presents to review CT scan.   She was a prior smoker. Quit > 20 years ago. Smoked 1 pack per week X 20 years.   Denies any occupation exposures.   Has family significant lung cancer, breast cancer, kidney and colon cancer.   She has personal history of breast cancer and is followed by Tammy [name]  She is status post lumpectomy and currently on Arimidex.  Most recent mammogram was negative for recurrence recommended follow-up in 1 year.  She currently is  doing well.  She lives at home with her husband.  She had a right total knee replaced and needs to have her left knee replaced but unfortunately has not recovered from the right.  She uses a wheelchair for long distances.  She is very unsteady on her feet.  She denies any respiratory concerns including shortness of breath.  Feels well.  ECOG FS:2 - Symptomatic, <50% confined to bed  Review of systems- Review of Systems  Constitutional: Positive for malaise/fatigue. Negative for chills, fever and weight loss.  HENT: Negative for congestion, ear pain and tinnitus.   Eyes: Negative.  Negative for blurred vision and double vision.  Respiratory: Negative.  Negative for cough, sputum production and shortness of breath.   Cardiovascular: Negative.  Negative for chest pain, palpitations and leg swelling.  Gastrointestinal: Negative.  Negative for abdominal pain, constipation, diarrhea, nausea and vomiting.  Genitourinary: Negative for dysuria, frequency and urgency.  Musculoskeletal: Positive for joint pain and myalgias. Negative for back pain and falls.  Skin: Negative.  Negative for rash.  Neurological: Negative.  Negative for weakness and headaches.  Endo/Heme/Allergies: Negative.  Does not bruise/bleed easily.  Psychiatric/Behavioral: Negative.  Negative for depression. The patient is not nervous/anxious and does not have insomnia.      Allergies  Allergen Reactions  . Hydrocodone Other (See Comments)    Unresponsive  . Aleve [Naproxen Sodium]     Hives & itching   . Contrast Media [Iodinated Diagnostic Agents]     Pt avoids due to kidney issues  . Ioxaglate Other (See Comments)    (iodine) Pt avoids due to kidney issues   . Oxycodone Other (  See Comments)    hallucinations  . Ranexa [Ranolazine]     Sick on stomach  . Sulfa Antibiotics Other (See Comments)    Pt avoids due to kidney issues  . Tramadol Other (See Comments)    nightmares     Past Medical History:  Diagnosis Date   . Asthma   . Benign neoplasm of colon   . Breast cancer ([place]) 08/2018   left breast  . Cervical disc disease    "bulging" - no limitations per pt  . Chronic diastolic CHF (congestive heart failure) ([place])    a. 07/2012 Echo: Nl EF. mild inferoseptal HK, Gr 2 DD, mild conc LVH, mild PR/TR, mild PAH.  . CKD (chronic kidney disease), stage III ([place])   . COPD (chronic obstructive pulmonary disease) ([place])   . Coronary artery disease    a. 11/2011 Cath: LAD 33md, LCX min irregs, RCA 70p, 1054mith L->R collats, EF 60%-->Med Rx.  . Diabetes mellitus without complication ([facility]  . Fusion of lumbar spine 12/22/2015  . GERD (gastroesophageal reflux disease)   . History of DVT (deep vein thrombosis)   . History of tobacco abuse    a. Quit 2011.  . Marland Kitchenyperlipidemia   . Hypertension   . Hypothyroidism   . Obesity   . Palpitations   . PE (pulmonary embolism) 10/15  . Personal history of radiation therapy   . PONV (postoperative nausea and vomiting)   . Pulmonary [place]us ([place]   R[place]. Presented with SOB and elevated d-dimer.   . Marland KitchenVD (peripheral vascular disease) ([facility]   a. PTA of right leg and left femoral artery with stenosis.  . Renal insufficiency   . [facility]   TIAs - no deficits     Past Surgical History:  Procedure Laterality Date  . ANGIOPLASTY / STENTING FEMORAL     PVD; angioplasty right leg and left femoral artery with stenosis.   . APPENDECTOMY  Oct. 2016  . BACK SURGERY  03/2012   nerve stimulator inserted  . BACK SURGERY     screws, rods replaced with new hardware.  . Marland KitchenACK SURGERY  11/2016  . BREAST LUMPECTOMY Left 08/26/2018  . BREAST SURGERY    . CARDIAC CATHETERIZATION  12/07/2011   Mid LAD 50%, distal LAD 50%, mid RCA 70%, distal RCA 100%   . CARDIAC CATHETERIZATION  01/04/2011   100% occluded mid RCA with good collaterals from distal LAD, normal LVEF.  . Marland KitchenHOLECYSTECTOMY    . COLONOSCOPY  2013  . COLONOSCOPY WITH PROPOFOL N/A 05/24/2015    Procedure: COLONOSCOPY WITH PROPOFOL;  Surgeon: DaLucilla LameMD;  [facility] Service: Endoscopy;  Laterality: N/A;  Diabetic - oral meds  . COLONOSCOPY WITH PROPOFOL N/A 01/28/2019   Procedure: COLONOSCOPY WITH PROPOFOL;  Surgeon: TaVirgel ManifoldMD;  Location: [facility] END[facility];  Service: Endoscopy;  Laterality: N/A;  . DIAGNOSTIC LAPAROSCOPY    . ESOPHAGOGASTRODUODENOSCOPY (EGD) WITH PROPOFOL N/A 01/28/2019   Procedure: ESOPHAGOGASTRODUODENOSCOPY (EGD) WITH PROPOFOL;  Surgeon: TaVirgel ManifoldMD;  Location: [facility] ENDOSCOPY;  Service: Endoscopy;  Laterality: N/A;  . LAPAROSCOPIC APPENDECTOMY N/A 01/06/2015   Procedure: APPENDECTOMY LAPAROSCOPIC;  Surgeon: SeChristene LyeMD;  Location: [facility] ORS;  Service: General;  Laterality: N/A;  . LEFT HEART CATH AND CORONARY ANGIOGRAPHY Left 04/04/2019   Procedure: LEFT HEART CATH AND CORONARY ANGIOGRAPHY;  Surgeon: GoMinna MerrittsMD;  [facility];  Service: Cardiovascular;  Laterality: Left;  .  MASTECTOMY    . MASTECTOMY W/ SENTINEL NODE BIOPSY Left 08/26/2018   Procedure: PARTIAL MASTECTOMY WIDE EXCISION WITH SENTINEL LYMPH NODE BIOPSY LEFT;  Surgeon: [name], MD;  Location: [facility] ORS;  Service: General;  Laterality: Left;  . POLYPECTOMY  05/24/2015   Procedure: POLYPECTOMY;  Surgeon: [name], MD;  Location: [place];  Service: Endoscopy;;  . THYROIDECTOMY    . TOTAL KNEE ARTHROPLASTY Right 11/13/2019  . TRIGGER FINGER RELEASE      Social History   Socioeconomic History  . Marital status: Married    Spouse name: Not on file  . Number of children: 2  . Years of education: some college, Production assistant, radio   . Highest education level: Associate degree: occupational, Hotel manager, or vocational program  Occupational History  . Occupation: Retired  Tobacco Use  . Smoking status: Former Smoker    Packs/day: 0.50    Years: 15.00    Pack years: 7.50     Types: Cigarettes    Quit date: 03/14/2007    Years since quitting: 13.0  . Smokeless tobacco: Never Used  Vaping Use  . Vaping Use: Never used  Substance and Sexual Activity  . Alcohol use: No    Alcohol/week: 0.0 standard drinks  . Drug use: Never  . Sexual activity: Yes    Birth control/protection: Post-menopausal  Other Topics Concern  . Not on file  Social History Narrative   Lives at home with husband in [place]. Quit smoking 10 years ago; never alcohol. 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    Social Determinants of Health   Financial Resource Strain: Low Risk   . Difficulty of Paying Living Expenses: Not hard at all  Food Insecurity: No Food Insecurity  . Worried About Charity fundraiser in the Last Year: Never true  . Ran Out of Food in the Last Year: Never true  Transportation Needs: No Transportation Needs  . Lack of Transportation (Medical): No  . Lack of Transportation (Non-Medical): No  Physical Activity: Sufficiently Active  . Days of Exercise per Week: 7 days  . Minutes of Exercise per Session: 60 min  Stress: No Stress Concern Present  . Feeling of Stress : Not at all  Social Connections: Socially Integrated  . Frequency of Communication with Friends and Family: More than three times a week  . Frequency of Social Gatherings with Friends and Family: More than three times a week  . Attends Religious Services: More than 4 times per year  . Active Member of Clubs or Organizations: Yes  . Attends Archivist Meetings: 1 to 4 times per year  . Marital Status: Married  Human resources officer Violence: Unknown  . Fear of Current or Ex-Partner: No  . Emotionally Abused: No  . Physically Abused: No  . Sexually Abused: Not on file    Family History  Problem Relation Age of Onset  . Hypertension Father   . Heart disease Father   . Heart attack Father   . Diabetes Father   . Cancer Mother        colon  . Heart  attack Brother   . Diabetes Brother   . Hypertension Brother   . Heart disease Brother   . Heart attack Brother   . Diabetes Brother   . Hypertension Brother   . Heart disease Brother   . Heart attack Brother   . Diabetes Brother   .  Hypertension Brother   . Heart disease Brother   . Cancer Sister        lung  . COPD Sister   . COPD Sister   . Breast cancer Neg Hx      Current Outpatient Medications:  .  anastrozole (ARIMIDEX) 1 MG tablet, Take 1 tablet (1 mg total) by mouth daily., Disp: 30 tablet, Rfl: 4 .  blood glucose meter kit and supplies KIT, Dispense based on patient and insurance preference. Use one time daily as directed, E11.9, Disp: 1 each, Rfl: 0 .  Calcium Carb-Cholecalciferol (CALCIUM 600 + D) 600-200 MG-UNIT TABS, Take 1 tablet by mouth daily. , Disp: , Rfl:  .  cetirizine (ZYRTEC) 10 MG tablet, Take 1 tablet (10 mg total) by mouth daily., Disp: 30 tablet, Rfl: 11 .  cholecalciferol (VITAMIN D3) 25 MCG (1000 UT) tablet, Take 1,000 Units by mouth daily., Disp: , Rfl:  .  Dulaglutide (TRULICITY) 3 HF/4.1SE SOPN, Inject 0.5 mLs (3 mg total) into the skin once a week., Disp: 2 mL, Rfl: 6 .  DULoxetine (CYMBALTA) 60 MG capsule, TAKE 1 CAPSULE BY MOUTH DAILY, Disp: 90 capsule, Rfl: 1 .  ELIQUIS 2.5 MG TABS tablet, TAKE 1 TABLET BY MOUTH TWICE DAILY, Disp: 180 tablet, Rfl: 1 .  gabapentin (NEURONTIN) 300 MG capsule, Take 1 capsule (300 mg total) by mouth 3 (three) times daily., Disp: 270 capsule, Rfl: 3 .  glipiZIDE (GLUCOTROL) 5 MG tablet, TAKE ONE TABLET BY MOUTH EVERY DAY BEFORE BREAKFAST, Disp: 90 tablet, Rfl: 1 .  insulin degludec (TRESIBA FLEXTOUCH) 100 UNIT/ML FlexTouch Pen, Inject 0.24 mLs (24 Units total) into the skin daily., Disp: 12 mL, Rfl: 1 .  Insulin Pen Needle (PEN NEEDLES 3/16") 31G X 5 MM MISC, 1 each by Does not apply route daily., Disp: 100 each, Rfl: 12 .  isosorbide mononitrate (IMDUR) 60 MG 24 hr tablet, TAKE ONE TABLET BY MOUTH AT BEDTIME AND HALF A  TABLET EVERY MORNING, Disp: 135 tablet, Rfl: 3 .  JARDIANCE 25 MG TABS tablet, TAKE ONE TABLET 25MG EVERY DAY BEFORE BREAKFAST, Disp: 90 tablet, Rfl: 1 .  ketoconazole (NIZORAL) 2 % shampoo, Apply 1 application topically 4 (four) times a week. Wash scalp 4 times weekly, let sit 5 minutes and rinse out (Patient not taking: Reported on 02/16/2020), Disp: 120 mL, Rfl: 4 .  Meth-Hyo-M Bl-Na Phos-Ph Sal (URO-MP) 118 MG CAPS, TAKE 1 CAPSULE BY MOUTH 3 TIMES DAILY ASNEEDED ( URINARY FREQUENCY,URGENCY, BURNING., Disp: 90 capsule, Rfl: 1 .  metoprolol succinate (TOPROL-XL) 50 MG 24 hr tablet, TAKE ONE TABLET EVERY DAY WITH OR IMMEDIATELY FOLLOWING A MEAL, Disp: 90 tablet, Rfl: 1 .  mometasone (ELOCON) 0.1 % lotion, Apply topically as directed. Qd up to 5 days a week to aa scalp until clear, then prn flares, Disp: 60 mL, Rfl: 1 .  montelukast (SINGULAIR) 10 MG tablet, Take 1 tablet (10 mg total) by mouth at bedtime., Disp: 90 tablet, Rfl: 1 .  Multiple Vitamins-Minerals (ZINC PO), Take 50 mg by mouth daily. , Disp: , Rfl:  .  MYRBETRIQ 50 MG TB24 tablet, TAKE ONE TABLET EVERY DAY, Disp: 30 tablet, Rfl: 2 .  nitroGLYCERIN (NITROSTAT) 0.4 MG SL tablet, Place 1 tablet (0.4 mg total) under the tongue every 5 (five) minutes as needed for chest pain., Disp: 25 tablet, Rfl: 6 .  olopatadine (PATADAY) 0.1 % ophthalmic solution, Place 1 drop into both eyes 2 (two) times daily., Disp: 5 mL, Rfl: 12 .  pantoprazole (PROTONIX) 40 MG tablet, Take 1 tablet (40 mg total) by mouth daily as needed., Disp: 90 tablet, Rfl: 1 .  Polyethylene Glycol 400 (BLINK TEARS) 0.25 % SOLN, Place 1 drop into both eyes 3 (three) times daily as needed (dry/irritated eyes.). , Disp: , Rfl:  .  PROAIR HFA 108 (90 Base) MCG/ACT inhaler, TAKE 2 PUFFS EVERY 4 HOURS AS NEEDED FORWHEEZING OR SHORTNESS OF BREATH, Disp: 8.5 g, Rfl: 3 .  rosuvastatin (CRESTOR) 40 MG tablet, TAKE 1 TABLET BY MOUTH ONCE DAILY, Disp: 90 tablet, Rfl: 1 .  senna (SENOKOT) 8.6  MG TABS tablet, SMARTSIG:2 Tablet(s) By Mouth Every 12 Hours PRN, Disp: , Rfl:  .  SYNTHROID 150 MCG tablet, TAKE 1 TABLET EVERY DAY ON EMPTY STOMACHWITH A GLASS OF WATER AT LEAST 30-60 MINBEFORE BREAKFAST, Disp: 30 tablet, Rfl: 2 .  trimethoprim (TRIMPEX) 100 MG tablet, TAKE ONE TABLET BY MOUTH EVERY DAY, Disp: 90 tablet, Rfl: 1  Physical exam: There were no vitals filed for this visit. Physical Exam Constitutional:      General: Vital signs are normal.     Appearance: Normal appearance.  HENT:     Head: Normocephalic and atraumatic.  Eyes:     Pupils: Pupils are equal, round, and reactive to light.  Cardiovascular:     Rate and Rhythm: Normal rate and regular rhythm.     Heart sounds: Normal heart sounds. No murmur heard.   Pulmonary:     Effort: Pulmonary effort is normal.     Breath sounds: Normal breath sounds. No wheezing.  Abdominal:     General: Bowel sounds are normal. There is no distension.     Palpations: Abdomen is soft.     Tenderness: There is no abdominal tenderness.  Musculoskeletal:        General: No edema. Normal range of motion.     Cervical back: Normal range of motion.  Skin:    General: Skin is warm and dry.     Findings: No rash.  Neurological:     Mental Status: She is alert and oriented to person, place, and time.  Psychiatric:        Judgment: Judgment normal.      CMP Latest Ref Rng & Units 02/10/2020  Glucose 65 - 99 mg/dL 84  BUN 8 - 27 mg/dL 23  Creatinine 0.57 - 1.00 mg/dL 1.75(H)  Sodium 134 - 144 mmol/L 144  Potassium 3.5 - 5.2 mmol/L 5.2  Chloride 96 - 106 mmol/L 106  CO2 20 - 29 mmol/L 23  Calcium 8.7 - 10.3 mg/dL 9.4  Total Protein 6.0 - 8.5 g/dL 7.2  Total Bilirubin 0.0 - 1.2 mg/dL 0.3  Alkaline Phos 44 - 121 IU/L 84  AST 0 - 40 IU/L 15  ALT 0 - 32 IU/L 11   CBC Latest Ref Rng & Units 02/10/2020  WBC 3.4 - 10.8 x10E3/uL 9.3  Hemoglobin 11.1 - 15.9 g/dL 13.0  Hematocrit 34.0 - 46.6 % 40.2  Platelets 150 - 450 x10E3/uL 303     No images are attached to the encounter.  CT Chest Wo Contrast  Result Date: 03/23/2020 CLINICAL DATA:  Followup pulmonary nodules. EXAM: CT CHEST WITHOUT CONTRAST TECHNIQUE: Multidetector CT imaging of the chest was performed following the standard protocol without IV contrast. COMPARISON:  CT scans 07/17/2019 and 03/11/2018 FINDINGS: Cardiovascular: The heart is normal in size. No pericardial effusion. Stable tortuosity, ectasia and calcification of the thoracic aorta. No focal aneurysm. Maximum diameter of the  descending thoracic aorta is 3.4 cm. Mediastinum/Nodes: Stable scattered mediastinal and hilar lymph nodes but no mass or overt adenopathy. The esophagus is grossly normal. Lungs/Pleura: Slight interval enlargement of the peripheral left upper lobe pulmonary nodule it measures 8 x 7 mm on image number 55 of series 3. Previously measured 6 x 6 mm when remeasured in the same planes. The smaller more posterior subpleural nodule in the left upper lobe on image number 52/3 is unchanged at 3 mm. Stable emphysematous changes and areas of pulmonary scarring. No new pulmonary nodules. No acute pulmonary findings. Stable basilar scarring changes. No pleural effusions or pleural nodules. Upper Abdomen: No significant upper abdominal findings. Stable aortic calcifications and mild ectasia. No upper abdominal adenopathy. Musculoskeletal: No significant bony findings. Spinal cord stimulator noted the midthoracic area. IMPRESSION: 1. Slight interval enlargement of the peripheral left upper lobe pulmonary nodule. At this point options would include PET-CT or continued short-term follow-up chest CT in 3-4 months. 2. 3 mm subpleural left upper lobe pulmonary nodule is stable. 3. Stable emphysematous changes and pulmonary scarring. No acute pulmonary findings. 4. Stable scattered mediastinal and hilar lymph nodes but no mass or adenopathy. 5. Stable atherosclerotic calcifications involving the thoracic and abdominal  aorta. 6. Emphysema and aortic atherosclerosis. Aortic Atherosclerosis (ICD10-I70.0) and Emphysema (ICD10-J43.9). Electronically Signed   By: Marijo Sanes M.D.   On: 03/23/2020 15:39     Assessment and plan- Patient is a 75 y.o. female who presents to pulmonary nodule clinic for follow-up of incidental lung nodules.   CT chest without contrast from the shows slight enlargement of peripheral left upper lobe pulmonary nodule.  3 mm subpleural left upper lobe pulmonary nodule is stable.  Recommend PET for short-term follow-up.  Discussed with Dr. Rogue [name] who recommends PET scan and tumor board within the next week or so.  Patient is agreeable.   Calculating malignancy probability of a pulmonary nodule: Risk factors include: 1.  Age. 2.  Cancer history. 3.  Diameter of pulmonary nodule and mm 4.  Location 5.  Smoking history 6.  Spiculation present   Based on risk factors, this patient is moderate risk for the development of lung cancer even in absence of a lung nodule.  I would recommend a PET scan, tumor board and follow-up with Dr. Rogue [name].    During our visit, we discussed pulmonary nodules are a common incidental finding and are often how lung cancer is discovered.  Lung cancer survival is directly related to the stage at diagnosis.  We discussed that nodules can vary in presentation from solitary pulmonary nodules to masses, 2 groundglass opacities and multiple nodules.  Pulmonary nodules in the majority of cases are benign but the probability of these becoming malignant cannot be undermined.  Early identification of malignant nodules could lead to early diagnosis and increased survival.   We discussed the probability of pulmonary nodules becoming malignant increase with age, pack years of tobacco use, size/characteristics of the nodule and location; with upper lobe involvement being most worrisome.   We discussed the goal of our clinic is to thoroughly evaluate each nodule,  developed a comprehensive, individualized plan of care utilizing the most advanced technology and significantly reduce the time from detection to treatment.  A dedicated pulmonary nodule clinic has proven to indeed expedite the detection and treatment of lung cancer.   Patient education in fact sheet provided along with most recent CT scans.  Plan: Discussed smoking history  Discussed PMH.  Discussed social history.  Reviewed  Ct scan   Disposition: PET scan in 1-2 weeks.  F/u with Dr. Rogue [name] after.    Visit Diagnosis 1. Lung nodules     Patient expressed understanding and was in agreement with this plan. She also understands that She can call clinic at any time with any questions, concerns, or complaints.   Greater than 50% was spent in counseling and coordination of care with this patient including but not limited to discussion of the relevant topics above (See A&P) including, but not limited to diagnosis and management of acute and chronic medical conditions.   Thank you for allowing me to participate in the care of this very pleasant patient.    Jacquelin Hawking, NP [place] [facility] Cell - [phone] Pager- [phone] 03/24/2020 3:58 PM

## 2020-03-25 ENCOUNTER — Encounter: Payer: Self-pay | Admitting: Anesthesiology

## 2020-03-25 ENCOUNTER — Telehealth: Payer: Self-pay | Admitting: Internal Medicine

## 2020-03-25 NOTE — Telephone Encounter (Signed)
03/25/2020 Called pt w/ info about upcoming appts per Rulon Abide. PET scan scheduled for 1/27, informed pt to arrive 30 mins prior, and be NPO 6 hrs. F/u w/ Dr. B scheduled for 04/13/20 @ 1 pm. Pt confirmed both  SRW

## 2020-03-31 ENCOUNTER — Other Ambulatory Visit: Admission: RE | Admit: 2020-03-31 | Payer: Medicare Other | Source: Ambulatory Visit

## 2020-04-02 ENCOUNTER — Ambulatory Visit: Admission: RE | Admit: 2020-04-02 | Payer: Medicare Other | Source: Home / Self Care | Admitting: Gastroenterology

## 2020-04-02 SURGERY — COLONOSCOPY WITH PROPOFOL
Anesthesia: Choice

## 2020-04-08 ENCOUNTER — Ambulatory Visit
Admission: RE | Admit: 2020-04-08 | Discharge: 2020-04-08 | Disposition: A | Discharge status: Home / Self Care | Payer: Medicare Other | Source: Ambulatory Visit | Attending: Oncology | Admitting: Oncology

## 2020-04-08 ENCOUNTER — Other Ambulatory Visit: Payer: Self-pay

## 2020-04-08 DIAGNOSIS — R918 Other nonspecific abnormal finding of lung field: Secondary | ICD-10-CM

## 2020-04-08 DIAGNOSIS — R911 Solitary pulmonary nodule: Secondary | ICD-10-CM | POA: Diagnosis not present

## 2020-04-08 DIAGNOSIS — J984 Other disorders of lung: Secondary | ICD-10-CM | POA: Diagnosis not present

## 2020-04-08 LAB — GLUCOSE, CAPILLARY: Glucose-Capillary: 75 mg/dL (ref 70–99)

## 2020-04-08 MED ORDER — FLUDEOXYGLUCOSE F - 18 (FDG) INJECTION
11.3590 | Freq: Once | INTRAVENOUS | Status: AC | PRN
  Administered 2020-04-08: 11.359 via INTRAVENOUS

## 2020-04-09 ENCOUNTER — Ambulatory Visit: Payer: Medicare Other | Admitting: Internal Medicine

## 2020-04-09 ENCOUNTER — Telehealth: Payer: Self-pay | Admitting: Oncology

## 2020-04-09 MED ORDER — LEVOFLOXACIN 500 MG PO TABS: 500.0000 mg | ORAL_TABLET | Freq: Every day | ORAL | 0 refills | Status: DC

## 2020-04-09 MED ORDER — PREDNISONE 10 MG (21) PO TBPK: ORAL_TABLET | ORAL | 0 refills | Status: DC

## 2020-04-09 NOTE — Telephone Encounter (Signed)
Re: Pet scan results    Received phone call this morning from radiology to discuss PET scan results.  Results were not available to review in epic quite yet due to software malfunction but radiologist was concerned about possible right upper lobe pneumonia.  Called to discuss with patient.  She states she has been feeling well up until this morning.  She has developed some shortness of breath but denies a cough, fever, sore throat or sinus congestion.  She has not checked her temperature but feels "cold".  Her husband has recently recovered from COVID-72.  His last day of quarantine was on Monday.  She denies any symptoms until today.  States she has not tested herself for COVID-19.  She does not have any home test.  She denies any chest pain, sputum production or headache.  She has been vaccinated against COVID-19 but not boosted.  Spoke with Dr. Rogue Bussing, who recommended speaking with patient to see if she if she was having symptoms.  Her PET scan is to be discussed next Thursday at tumor board conference.   Patient to get COVID-19 testing done today.  She was unaware of any place to get tested.  We did speak about alpha diagnostic in [place] and she was willing to call and get an appointment today.  RX Levaquin 500 mg daily x7 days. (Creatinine clearance 93.3 mL/min-no dosage adjustment needed per up-to-date) RX Medrol Dosepak X 6 days.   Patient will call with results of COVID 19 test.   Faythe Casa, NP 04/09/2020 11:54 AM

## 2020-04-12 ENCOUNTER — Telehealth: Payer: Self-pay | Admitting: *Deleted

## 2020-04-12 NOTE — Telephone Encounter (Signed)
Follow up phone call made to patient to check in to see how she was feeling after starting antibiotics last week. Pt stated that she is feeling better today and was able to find an at home covid test which was negative on Friday 1/28. Pt is going to test again today at home. Pt has follow up visit scheduled with Dr. Jacinto Reap tomorrow at 1pm and asked pt if could change her visit to virtual. Pt was in agreement for virtual visit tomorrow instead of face-to-face visit. Reviewed process for virtual visit with patient and instructed to call if has any questions or concerns prior to visit. Pt verbalized understanding and thanked me for the call.

## 2020-04-13 ENCOUNTER — Inpatient Hospital Stay: Payer: Medicare Other | Attending: Internal Medicine | Admitting: Internal Medicine

## 2020-04-13 DIAGNOSIS — Z170 Estrogen receptor positive status [ER+]: Secondary | ICD-10-CM

## 2020-04-13 DIAGNOSIS — C50312 Malignant neoplasm of lower-inner quadrant of left female breast: Secondary | ICD-10-CM | POA: Diagnosis not present

## 2020-04-13 NOTE — Assessment & Plan Note (Addendum)
#  Left upper lobe lung nodule 8 mm- JAN 27th, 8850-  Mild metabolic activity associated with enlarging LEFT upper lobe subpleural pulmonary nodule-concerning for malignancy.  Will review at the tumor conference.  #Right upper lobe-infiltrate incidental-suspicion pneumonia-on antibiotics.   # Left breast stage I breast cancer ER PR positive HER-2 negative; status post lumpectomy.  On arimidex. Mammogram-2021-seroma/s/p aspiration. PET JAN 2022- Mild hypermetabolic tissue in the deep LEFT breast; will discuss with Dr.Byrnett.   # BMD- FEB 2021- T score : - 1.5.on ca+vit D- monitor for now.   # CKD- III- creat 1.7.[NOV 2021-]; STABLE- Dr.Kolluru/follow-up with PCP.  # DISPOSITION: call after TC.  #Follow-up  TBD--Dr. Jacinto Reap

## 2020-04-13 NOTE — Progress Notes (Signed)
I connected with Tammy [name] on 04/13/20 at  1:00 PM EST by video enabled telemedicine visit and verified that I am speaking with the correct person using two identifiers.  I discussed the limitations, risks, security and privacy concerns of performing an evaluation and management service by telemedicine and the availability of in-person appointments. I also discussed with the patient that there may be a patient responsible charge related to this service. The patient expressed understanding and agreed to proceed.    Other persons participating in the visit and their role in the encounter: RN/medical reconciliation Patient's location: home Provider's location: office  Oncology History Overview Note   # July 2020 Left Breast- IMC; 14 mm; Grade: 2; Lymphovascular Invasion: Not identified;  pT1c pN0(sn); ER positive, PR positive, HER2 negative. [s/p lumpec; Dr.Byrnett]; rs/p RT [finished Sep, 9th 2020]  # Sep end 2020- START Arimidexa  # JAN 2022- 96m/left upper lobe lung nodule; PET positive; right upper lobe pneumonia/suspected reactive adenopathy.   # CKD-III/COPD/ CHF-compesated/PAD; CAD  DIAGNOSIS: Left breast cancer  STAGE: 1  ;GOALS: Cure  CURRENT/MOST RECENT THERAPY: arimidex   Malignant neoplasm of lower-inner quadrant of left female breast ([facility]  08/14/2018 Initial Diagnosis   Malignant neoplasm of lower-inner quadrant of left female breast ([facility]   Carcinoma of lower-inner quadrant of left breast in [place], estrogen receptor positive [place] [place]  10/02/2018 Initial Diagnosis   Carcinoma of lower-inner quadrant of left breast in female, estrogen receptor positive [place]     Chief Complaint: Lung nodule/breast cancer   History of present illness:Tammy [name] 75y.o.  female with history of stage I ER/PR positive HER-2 negative breast cancer on anastrozole; incidentally noted to have left upper lobe lung nodule/on lung cancer screening program.  Patient is here to review the  results of the PET scan.  Incidental PET scan patient noted to have right upper lobe pneumonia-patient on antibiotics.  She denies any history of fevers.  Denies any chills.  Denies any worsening cough.  With regards to breast cancer patient denies any new lumps or bumps.   Observation/objective: No acute distress.  Alert oriented x3.  Assessment and plan: Carcinoma of lower-inner quadrant of left breast in female, estrogen receptor positive ([place] #Left upper lobe lung nodule 8 mm- JAN 27th, 27510  Mild metabolic activity associated with enlarging LEFT upper lobe subpleural pulmonary nodule-concerning for malignancy.  Will review at the tumor conference.  #Right upper lobe-infiltrate incidental-suspicion pneumonia-on antibiotics.   # Left breast stage I breast cancer ER PR positive HER-2 negative; status post lumpectomy.  On arimidex. Mammogram-2021-seroma/s/p aspiration. PET JAN 2022- Mild hypermetabolic tissue in the deep LEFT breast; will discuss with Dr.Byrnett.   # BMD- FEB 2021- T score : - 1.5.on ca+vit D- monitor for now.   # CKD- III- creat 1.7.[NOV 2021-]; STABLE- Dr.Kolluru/follow-up with PCP.  # DISPOSITION: call after TC.  #Follow-up  TBD--Dr. B  Follow-up instructions:  I discussed the assessment and treatment plan with the patient.  The patient was provided an opportunity to ask questions and all were answered.  The patient agreed with the plan and demonstrated understanding of instructions.  The patient was advised to call back or seek an in person evaluation if the symptoms worsen or if the condition fails to improve as anticipated.   Dr. GCharlaine DaltonCSt. [facility]aelsat [facility]2/03/2020 2:16 PM

## 2020-04-15 ENCOUNTER — Telehealth: Payer: Self-pay | Admitting: *Deleted

## 2020-04-15 DIAGNOSIS — R911 Solitary pulmonary nodule: Secondary | ICD-10-CM

## 2020-04-15 NOTE — Telephone Encounter (Signed)
Per Dr. Jacinto Reap - pt needs follow up CT scan without contrast in 3 months and to be scheduled for follow up with him 1-2 days after. Referral to Dr. Patsey Berthold as well after CT scan in 3 months. Orders placed. Schedule message sent. Will call pt to review MD recommendations once appts scheduled.

## 2020-04-16 NOTE — Telephone Encounter (Signed)
Pt has been made aware of MD recommendations and informed that I will call back with her appts once scheduled. All questions answered. Contact info given to patient and instructed to call back with any future questions or needs. Pt verbalized understanding.

## 2020-04-17 ENCOUNTER — Telehealth: Payer: Self-pay | Admitting: Internal Medicine

## 2020-04-17 ENCOUNTER — Other Ambulatory Visit: Payer: Self-pay

## 2020-04-17 ENCOUNTER — Observation Stay
Admission: EM | Admit: 2020-04-17 | Discharge: 2020-04-18 | Disposition: A | Discharge status: Home / Self Care | Payer: Medicare Other | Attending: Hospitalist | Admitting: Hospitalist

## 2020-04-17 ENCOUNTER — Emergency Department: Payer: Medicare Other

## 2020-04-17 ENCOUNTER — Encounter: Payer: Self-pay | Admitting: Internal Medicine

## 2020-04-17 DIAGNOSIS — W19XXXD Unspecified fall, subsequent encounter: Secondary | ICD-10-CM

## 2020-04-17 DIAGNOSIS — C50312 Malignant neoplasm of lower-inner quadrant of left female breast: Secondary | ICD-10-CM

## 2020-04-17 DIAGNOSIS — R911 Solitary pulmonary nodule: Secondary | ICD-10-CM

## 2020-04-17 DIAGNOSIS — J449 Chronic obstructive pulmonary disease, unspecified: Secondary | ICD-10-CM

## 2020-04-17 DIAGNOSIS — R531 Weakness: Secondary | ICD-10-CM

## 2020-04-17 DIAGNOSIS — Z8673 Personal history of transient ischemic attack (TIA), and cerebral infarction without residual deficits: Secondary | ICD-10-CM | POA: Diagnosis not present

## 2020-04-17 DIAGNOSIS — W19XXXA Unspecified fall, initial encounter: Secondary | ICD-10-CM | POA: Diagnosis present

## 2020-04-17 DIAGNOSIS — N1832 Chronic kidney disease, stage 3b: Secondary | ICD-10-CM | POA: Diagnosis not present

## 2020-04-17 DIAGNOSIS — R062 Wheezing: Secondary | ICD-10-CM | POA: Diagnosis not present

## 2020-04-17 DIAGNOSIS — E039 Hypothyroidism, unspecified: Secondary | ICD-10-CM | POA: Diagnosis not present

## 2020-04-17 DIAGNOSIS — Z7901 Long term (current) use of anticoagulants: Secondary | ICD-10-CM | POA: Insufficient documentation

## 2020-04-17 DIAGNOSIS — I2510 Atherosclerotic heart disease of native coronary artery without angina pectoris: Secondary | ICD-10-CM | POA: Diagnosis not present

## 2020-04-17 DIAGNOSIS — Z853 Personal history of malignant neoplasm of breast: Secondary | ICD-10-CM | POA: Diagnosis not present

## 2020-04-17 DIAGNOSIS — A419 Sepsis, unspecified organism: Principal | ICD-10-CM

## 2020-04-17 DIAGNOSIS — J45909 Unspecified asthma, uncomplicated: Secondary | ICD-10-CM | POA: Diagnosis not present

## 2020-04-17 DIAGNOSIS — N390 Urinary tract infection, site not specified: Secondary | ICD-10-CM | POA: Diagnosis not present

## 2020-04-17 DIAGNOSIS — E1165 Type 2 diabetes mellitus with hyperglycemia: Secondary | ICD-10-CM | POA: Diagnosis not present

## 2020-04-17 DIAGNOSIS — I10 Essential (primary) hypertension: Secondary | ICD-10-CM | POA: Diagnosis present

## 2020-04-17 DIAGNOSIS — Z87891 Personal history of nicotine dependence: Secondary | ICD-10-CM | POA: Diagnosis not present

## 2020-04-17 DIAGNOSIS — R8281 Pyuria: Secondary | ICD-10-CM | POA: Diagnosis not present

## 2020-04-17 DIAGNOSIS — N183 Chronic kidney disease, stage 3 unspecified: Secondary | ICD-10-CM | POA: Diagnosis present

## 2020-04-17 DIAGNOSIS — Z86711 Personal history of pulmonary embolism: Secondary | ICD-10-CM | POA: Insufficient documentation

## 2020-04-17 DIAGNOSIS — R0902 Hypoxemia: Secondary | ICD-10-CM | POA: Diagnosis not present

## 2020-04-17 DIAGNOSIS — I130 Hypertensive heart and chronic kidney disease with heart failure and stage 1 through stage 4 chronic kidney disease, or unspecified chronic kidney disease: Secondary | ICD-10-CM | POA: Diagnosis not present

## 2020-04-17 DIAGNOSIS — E1122 Type 2 diabetes mellitus with diabetic chronic kidney disease: Secondary | ICD-10-CM | POA: Insufficient documentation

## 2020-04-17 DIAGNOSIS — Z79899 Other long term (current) drug therapy: Secondary | ICD-10-CM | POA: Diagnosis not present

## 2020-04-17 DIAGNOSIS — Z20822 Contact with and (suspected) exposure to covid-19: Secondary | ICD-10-CM | POA: Diagnosis not present

## 2020-04-17 DIAGNOSIS — I739 Peripheral vascular disease, unspecified: Secondary | ICD-10-CM | POA: Diagnosis present

## 2020-04-17 DIAGNOSIS — E119 Type 2 diabetes mellitus without complications: Secondary | ICD-10-CM | POA: Diagnosis present

## 2020-04-17 DIAGNOSIS — I499 Cardiac arrhythmia, unspecified: Secondary | ICD-10-CM | POA: Diagnosis not present

## 2020-04-17 DIAGNOSIS — R000 Tachycardia, unspecified: Secondary | ICD-10-CM | POA: Diagnosis not present

## 2020-04-17 DIAGNOSIS — Z794 Long term (current) use of insulin: Secondary | ICD-10-CM | POA: Diagnosis not present

## 2020-04-17 DIAGNOSIS — Z170 Estrogen receptor positive status [ER+]: Secondary | ICD-10-CM

## 2020-04-17 DIAGNOSIS — I5032 Chronic diastolic (congestive) heart failure: Secondary | ICD-10-CM | POA: Diagnosis not present

## 2020-04-17 DIAGNOSIS — J189 Pneumonia, unspecified organism: Secondary | ICD-10-CM | POA: Diagnosis not present

## 2020-04-17 LAB — TROPONIN I (HIGH SENSITIVITY)
Troponin I (High Sensitivity): 12 ng/L (ref <18)
Troponin I (High Sensitivity): 12 ng/L (ref <18)

## 2020-04-17 LAB — URINALYSIS, ROUTINE W REFLEX MICROSCOPIC
Bacteria, UA: NONE SEEN
Bilirubin Urine: NEGATIVE
Glucose, UA: >=500 mg/dL — AB
Ketones, ur: NEGATIVE mg/dL
Nitrite: NEGATIVE
Protein, ur: 30 mg/dL — AB
Specific Gravity, Urine: 1.025 (ref 1.005–1.030)
WBC, UA: >50 WBC/hpf — ABNORMAL HIGH (ref 0–5)
pH: 5 (ref 5.0–8.0)

## 2020-04-17 LAB — HEPATIC FUNCTION PANEL
ALT: 11 U/L (ref 0–44)
AST: 15 U/L (ref 15–41)
Albumin: 3.3 g/dL — ABNORMAL LOW (ref 3.5–5.0)
Alkaline Phosphatase: 68 U/L (ref 38–126)
Bilirubin, Direct: 0.1 mg/dL (ref 0.0–0.2)
Indirect Bilirubin: 0.7 mg/dL (ref 0.3–0.9)
Total Bilirubin: 0.8 mg/dL (ref 0.3–1.2)
Total Protein: 6.8 g/dL (ref 6.5–8.1)

## 2020-04-17 LAB — LACTIC ACID, PLASMA: Lactic Acid, Venous: 0.9 mmol/L (ref 0.5–1.9)

## 2020-04-17 LAB — CBC
HCT: 40.8 % (ref 36.0–46.0)
Hemoglobin: 12.8 g/dL (ref 12.0–15.0)
MCH: 26.4 pg (ref 26.0–34.0)
MCHC: 31.4 g/dL (ref 30.0–36.0)
MCV: 84.1 fL (ref 80.0–100.0)
Platelets: 302 10*3/uL (ref 150–400)
RBC: 4.85 MIL/uL (ref 3.87–5.11)
RDW: 14.8 % (ref 11.5–15.5)
WBC: 16.2 10*3/uL — ABNORMAL HIGH (ref 4.0–10.5)
nRBC: 0 % (ref 0.0–0.2)

## 2020-04-17 LAB — BASIC METABOLIC PANEL
Anion gap: 8 (ref 5–15)
BUN: 25 mg/dL — ABNORMAL HIGH (ref 8–23)
CO2: 23 mmol/L (ref 22–32)
Calcium: 7.9 mg/dL — ABNORMAL LOW (ref 8.9–10.3)
Chloride: 106 mmol/L (ref 98–111)
Creatinine, Ser: 1.73 mg/dL — ABNORMAL HIGH (ref 0.44–1.00)
GFR, Estimated: 30 mL/min — ABNORMAL LOW (ref >60)
Glucose, Bld: 221 mg/dL — ABNORMAL HIGH (ref 70–99)
Potassium: 4.1 mmol/L (ref 3.5–5.1)
Sodium: 137 mmol/L (ref 135–145)

## 2020-04-17 LAB — HEMOGLOBIN A1C
Hgb A1c MFr Bld: 8.6 % — ABNORMAL HIGH (ref 4.8–5.6)
Mean Plasma Glucose: 200.12 mg/dL

## 2020-04-17 LAB — LIPASE, BLOOD: Lipase: 42 U/L (ref 11–51)

## 2020-04-17 LAB — SARS CORONAVIRUS 2 (TAT 6-24 HRS): SARS Coronavirus 2: NEGATIVE

## 2020-04-17 LAB — PROTIME-INR
INR: 1.1 (ref 0.8–1.2)
Prothrombin Time: 13.7 seconds (ref 11.4–15.2)

## 2020-04-17 LAB — CBG MONITORING, ED: Glucose-Capillary: 170 mg/dL — ABNORMAL HIGH (ref 70–99)

## 2020-04-17 LAB — GLUCOSE, CAPILLARY
Glucose-Capillary: 112 mg/dL — ABNORMAL HIGH (ref 70–99)
Glucose-Capillary: 125 mg/dL — ABNORMAL HIGH (ref 70–99)

## 2020-04-17 LAB — PROCALCITONIN: Procalcitonin: <0.1 ng/mL

## 2020-04-17 LAB — APTT: aPTT: 30 seconds (ref 24–36)

## 2020-04-17 MED ORDER — LORATADINE 10 MG PO TABS
10.0000 mg | ORAL_TABLET | Freq: Every day | ORAL | Status: DC
  Administered 2020-04-17 – 2020-04-18 (×2): 10 mg via ORAL
  Filled 2020-04-17 (×3): qty 1

## 2020-04-17 MED ORDER — PANTOPRAZOLE SODIUM 40 MG PO TBEC
40.0000 mg | DELAYED_RELEASE_TABLET | Freq: Every day | ORAL | Status: DC
Start: 2020-04-17 — End: 2020-04-18
  Administered 2020-04-17 – 2020-04-18 (×2): 40 mg via ORAL
  Filled 2020-04-17 (×3): qty 1

## 2020-04-17 MED ORDER — APIXABAN 2.5 MG PO TABS
2.5000 mg | ORAL_TABLET | Freq: Two times a day (BID) | ORAL | Status: DC
Start: 2020-04-17 — End: 2020-04-18
  Administered 2020-04-17 – 2020-04-18 (×3): 2.5 mg via ORAL
  Filled 2020-04-17 (×4): qty 1

## 2020-04-17 MED ORDER — SODIUM CHLORIDE 0.9 % IV SOLN: 1.0000 g | INTRAVENOUS | Status: DC

## 2020-04-17 MED ORDER — ZINC SULFATE 220 (50 ZN) MG PO CAPS
220.0000 mg | ORAL_CAPSULE | Freq: Every day | ORAL | Status: DC
  Administered 2020-04-18: 220 mg via ORAL
  Filled 2020-04-17: qty 1

## 2020-04-17 MED ORDER — MONTELUKAST SODIUM 10 MG PO TABS
10.0000 mg | ORAL_TABLET | Freq: Every day | ORAL | Status: DC
  Administered 2020-04-17: 10 mg via ORAL
  Filled 2020-04-17 (×2): qty 1

## 2020-04-17 MED ORDER — LACTATED RINGERS IV BOLUS (SEPSIS)
1000.0000 mL | Freq: Once | INTRAVENOUS | Status: AC
  Administered 2020-04-17: 1000 mL via INTRAVENOUS

## 2020-04-17 MED ORDER — CALCIUM-VITAMIN D 500-200 MG-UNIT PO TABS
1.0000 | ORAL_TABLET | Freq: Every day | ORAL | Status: DC
  Administered 2020-04-18: 1 via ORAL
  Filled 2020-04-17: qty 1

## 2020-04-17 MED ORDER — VITAMIN D 25 MCG (1000 UNIT) PO TABS
1000.0000 [IU] | ORAL_TABLET | Freq: Every day | ORAL | Status: DC
  Administered 2020-04-18: 1000 [IU] via ORAL
  Filled 2020-04-17: qty 1

## 2020-04-17 MED ORDER — ISOSORBIDE MONONITRATE ER 30 MG PO TB24
60.0000 mg | ORAL_TABLET | Freq: Every day | ORAL | Status: DC
  Administered 2020-04-17 – 2020-04-18 (×2): 60 mg via ORAL
  Filled 2020-04-17: qty 1
  Filled 2020-04-17: qty 2

## 2020-04-17 MED ORDER — TRIMETHOPRIM 100 MG PO TABS
100.0000 mg | ORAL_TABLET | Freq: Every day | ORAL | Status: DC
  Administered 2020-04-17 – 2020-04-18 (×2): 100 mg via ORAL
  Filled 2020-04-17 (×2): qty 1

## 2020-04-17 MED ORDER — INSULIN ASPART 100 UNIT/ML ~~LOC~~ SOLN
0.0000 [IU] | Freq: Three times a day (TID) | SUBCUTANEOUS | Status: DC
  Administered 2020-04-17: 3 [IU] via SUBCUTANEOUS
  Administered 2020-04-18: 2 [IU] via SUBCUTANEOUS
  Filled 2020-04-17 (×2): qty 1

## 2020-04-17 MED ORDER — ROSUVASTATIN CALCIUM 10 MG PO TABS
40.0000 mg | ORAL_TABLET | Freq: Every day | ORAL | Status: DC
  Administered 2020-04-17 – 2020-04-18 (×2): 40 mg via ORAL
  Filled 2020-04-17: qty 2
  Filled 2020-04-17: qty 4

## 2020-04-17 MED ORDER — LEVOTHYROXINE SODIUM 50 MCG PO TABS
150.0000 ug | ORAL_TABLET | Freq: Every day | ORAL | Status: DC
  Administered 2020-04-17 – 2020-04-18 (×2): 150 ug via ORAL
  Filled 2020-04-17 (×2): qty 3

## 2020-04-17 MED ORDER — SODIUM CHLORIDE 0.9 % IV SOLN: INTRAVENOUS | Status: DC

## 2020-04-17 MED ORDER — METOPROLOL SUCCINATE ER 50 MG PO TB24
50.0000 mg | ORAL_TABLET | Freq: Every day | ORAL | Status: DC
  Administered 2020-04-18: 50 mg via ORAL
  Filled 2020-04-17 (×2): qty 1

## 2020-04-17 MED ORDER — ONDANSETRON HCL 4 MG PO TABS: 4.0000 mg | ORAL_TABLET | Freq: Four times a day (QID) | ORAL | Status: DC | PRN

## 2020-04-17 MED ORDER — ALBUTEROL SULFATE HFA 108 (90 BASE) MCG/ACT IN AERS
2.0000 | INHALATION_SPRAY | RESPIRATORY_TRACT | Status: DC | PRN
  Administered 2020-04-17: 2 via RESPIRATORY_TRACT
  Filled 2020-04-17 (×2): qty 6.7

## 2020-04-17 MED ORDER — GABAPENTIN 300 MG PO CAPS
300.0000 mg | ORAL_CAPSULE | Freq: Three times a day (TID) | ORAL | Status: DC
  Administered 2020-04-17 – 2020-04-18 (×3): 300 mg via ORAL
  Filled 2020-04-17 (×3): qty 1

## 2020-04-17 MED ORDER — POLYVINYL ALCOHOL 1.4 % OP SOLN
1.0000 [drp] | Freq: Three times a day (TID) | OPHTHALMIC | Status: DC | PRN
  Filled 2020-04-17: qty 15

## 2020-04-17 MED ORDER — ONDANSETRON HCL 4 MG/2ML IJ SOLN: 4.0000 mg | Freq: Four times a day (QID) | INTRAMUSCULAR | Status: DC | PRN

## 2020-04-17 MED ORDER — SODIUM CHLORIDE 0.9 % IV SOLN
1.0000 g | INTRAVENOUS | Status: DC
  Administered 2020-04-17 – 2020-04-18 (×2): 1 g via INTRAVENOUS
  Filled 2020-04-17: qty 10
  Filled 2020-04-17: qty 1
  Filled 2020-04-17: qty 10

## 2020-04-17 MED ORDER — SENNA 8.6 MG PO TABS
1.0000 | ORAL_TABLET | Freq: Every day | ORAL | Status: DC
  Administered 2020-04-17: 8.6 mg via ORAL
  Filled 2020-04-17: qty 1

## 2020-04-17 MED ORDER — MIRABEGRON ER 50 MG PO TB24
50.0000 mg | ORAL_TABLET | Freq: Every day | ORAL | Status: DC
  Administered 2020-04-17 – 2020-04-18 (×2): 50 mg via ORAL
  Filled 2020-04-17 (×2): qty 1

## 2020-04-17 MED ORDER — ANASTROZOLE 1 MG PO TABS
1.0000 mg | ORAL_TABLET | Freq: Every day | ORAL | Status: DC
  Administered 2020-04-17 – 2020-04-18 (×2): 1 mg via ORAL
  Filled 2020-04-17 (×2): qty 1

## 2020-04-17 MED ORDER — DULOXETINE HCL 60 MG PO CPEP
60.0000 mg | ORAL_CAPSULE | Freq: Every day | ORAL | Status: DC
  Administered 2020-04-17 – 2020-04-18 (×2): 60 mg via ORAL
  Filled 2020-04-17 (×2): qty 1

## 2020-04-17 MED ORDER — NITROGLYCERIN 0.4 MG SL SUBL: 0.4000 mg | SUBLINGUAL_TABLET | SUBLINGUAL | Status: DC | PRN

## 2020-04-17 NOTE — Evaluation (Signed)
Physical Therapy Evaluation Patient Details Name: Tammy [name] MRN: [id number] DOB: 1945/06/14 Today's Date: 04/17/2020   History of Present Illness  75 y.o. female with medical history significant for breast cancer status post radiation therapy, history of venous thromboembolic disease on anticoagulation with Eliquis, history of diabetes mellitus with complications of chronic kidney disease stage III, COPD, hypothyroidism, hypertension and obesity who was brought into the emergency room by EMS for evaluation following a fall.  Clinical Impression  Pt was eager to work with PT and see what she can do.  She did need some assist with getting to sitting, but was able to stand and do ~60 ft of ambulation with walker with decent confidence.  She showed some safely concerns with walker manipulation and had fatigue with concurrent HR increase to 120s but overall did reasonably well.    Follow Up Recommendations Home health PT;Supervision - Intermittent    Equipment Recommendations  None recommended by PT    Recommendations for Other Services       Precautions / Restrictions Precautions Precautions: Fall Restrictions Weight Bearing Restrictions: No      Mobility  Bed Mobility Overal bed mobility: Needs Assistance Bed Mobility: Rolling;Sidelying to Sit Rolling: Min guard Sidelying to sit: Mod assist;Min assist       General bed mobility comments: Pt showed effort to try and get to sitting w/o assist, however she ultimately needed relatively heavy assist to attain sitting    Transfers Overall transfer level: Independent Equipment used: Rolling walker (2 wheeled)                Ambulation/Gait Ambulation/Gait assistance: Herbalist (Feet): 60 Feet Assistive device: Rolling walker (2 wheeled)       General Gait Details: Pt reliant on the walker (per baseline) but was able to confidently do a moderate bout of ambulation. Pt on room air with O2 remaining in the  90s, HR did get up to 120s with the effort and she did endorse some fatigue.  Cues to use walker safely/appropriate, especially during turns.  Stairs            Wheelchair Mobility    Modified Rankin (Stroke Patients Only)       Balance Overall balance assessment: Needs assistance Sitting-balance support: Bilateral upper extremity supported Sitting balance-Leahy Scale: Fair Sitting balance - Comments: able to maintain sitting balance w/o direct assist   Standing balance support: Bilateral upper extremity supported Standing balance-Leahy Scale: Fair Standing balance comment: Pt did not have any LOBs with b/l UE use on walker, but did need consistent cuing to stay upright and inside walker.  Direct assist at time with walker manipulation/position.   Single Leg Stance - Left Leg: 1                         Pertinent Vitals/Pain Pain Assessment: Faces Faces Pain Scale: Hurts little more Pain Location: back feels sore form being in bed ("I don't think it's from the fall"    Home Living Family/patient expects to be discharged to:: Private residence Living Arrangements: Spouse/significant other Available Help at Discharge: Available 24 hours/day;Family   Home Access: Stairs to enter Entrance Stairs-Rails: Can reach both Entrance Stairs-Number of Steps: 4 Home Layout: Two level;Able to live on main level with bedroom/bathroom Home Equipment: Gilford Rile - 2 wheels;Walker - 4 wheels;Shower seat - built in      Prior Function Level of Independence: Independent with assistive device(s)  Comments: Pt does get out of the house 1d/wk to visit family, able to do most of her dressing, bathing, etc with only supervision     Hand Dominance        Extremity/Trunk Assessment   Upper Extremity Assessment Upper Extremity Assessment: Generalized weakness    Lower Extremity Assessment Lower Extremity Assessment: Generalized weakness       Communication    Communication: No difficulties  Cognition Arousal/Alertness: Awake/alert Behavior During Therapy: WFL for tasks assessed/performed Overall Cognitive Status: Within Functional Limits for tasks assessed                                        General Comments      Exercises     Assessment/Plan    PT Assessment Patient needs continued PT services  PT Problem List Decreased strength;Decreased range of motion;Decreased activity tolerance;Decreased balance;Decreased safety awareness;Decreased knowledge of use of DME;Decreased mobility;Decreased coordination;Cardiopulmonary status limiting activity       PT Treatment Interventions DME instruction;Gait training;Stair training;Functional mobility training;Therapeutic activities;Therapeutic exercise;Neuromuscular re-education;Balance training;Patient/family education    PT Goals (Current goals can be found in the Care Plan section)  Acute Rehab PT Goals Patient Stated Goal: go home PT Goal Formulation: With patient Time For Goal Achievement: 05/01/20 Potential to Achieve Goals: Good    Frequency Min 2X/week   Barriers to discharge        Co-evaluation               AM-PAC PT "6 Clicks" Mobility  Outcome Measure Help needed turning from your back to your side while in a flat bed without using bedrails?: A Little Help needed moving from lying on your back to sitting on the side of a flat bed without using bedrails?: A Little Help needed moving to and from a bed to a chair (including a wheelchair)?: A Little Help needed standing up from a chair using your arms (e.g., wheelchair or bedside chair)?: A Little Help needed to walk in hospital room?: A Little Help needed climbing 3-5 steps with a railing? : A Lot 6 Click Score: 17    End of Session Equipment Utilized During Treatment: Gait belt Activity Tolerance: Patient tolerated treatment well;Patient limited by fatigue Patient left: in bed;with nursing/sitter  in room Nurse Communication: Mobility status PT Visit Diagnosis: Muscle weakness (generalized) (M62.81);Difficulty in walking, not elsewhere classified (R26.2)    Time: 2595-6387 PT Time Calculation (min) (ACUTE ONLY): 23 min   Charges:   PT Evaluation $PT Eval Low Complexity: 1 Low          Kreg Shropshire, DPT 04/17/2020, 1:06 PM

## 2020-04-17 NOTE — Progress Notes (Signed)
2/4 Code Sepsis initiated @ 8295 AM, [place] following.

## 2020-04-17 NOTE — ED Notes (Addendum)
Changed bed. Placed purewick. Provided clean warm blankets to pt. Husband at bedside, denies any needs at this time. Pt asked if can have water. Messaged attending to ask if pt still NPO.

## 2020-04-17 NOTE — Telephone Encounter (Signed)
On 2/4- I called and

## 2020-04-17 NOTE — ED Provider Notes (Signed)
 [facility] Emergency Department Provider Note  ____________________________________________   Event Date/Time   First MD Initiated Contact with Patient 04/17/20 0353     (approximate)  I have reviewed the triage vital signs and the nursing notes.   HISTORY  Chief Complaint Weakness    HPI Tammy [name] is a 74 y.o. female with medical history as listed below which notably includes a history of breast cancer though she is not actively undergoing chemotherapy.  She also has chronic kidney disease and has had blood clots in the past and takes Eliquis.  She presents tonight after a mild fall.  She reports that she slid down to the ground because she felt too weak to walk when she was going to the bathroom.  She did not strike her head and she did not lose consciousness.  The episode was acute in onset.  She has not been feeling very well for couple of days but in a nonspecific way.  She denies fever, sore throat, chest pain, shortness of breath, nausea, vomiting, abdominal pain, and dysuria.  Nothing in particular makes her feel better or worse.  She is vaccinated for COVID-19.         Past Medical History:  Diagnosis Date  . Asthma   . Benign neoplasm of colon   . Breast cancer (HCC) 08/2018   left breast  . Cervical disc disease    "bulging" - no limitations per pt  . Chronic diastolic CHF (congestive heart failure) (HCC)    a. 07/2012 Echo: Nl EF. mild inferoseptal HK, Gr 2 DD, mild conc LVH, mild PR/TR, mild PAH.  . CKD (chronic kidney disease), stage III (HCC)   . COPD (chronic obstructive pulmonary disease) (HCC)   . Coronary artery disease    a. 11/2011 Cath: LAD 50m/d, LCX min irregs, RCA 70p, 100m with L->R collats, EF 60%-->Med Rx.  . Diabetes mellitus without complication (HCC)   . Fusion of lumbar spine 12/22/2015  . GERD (gastroesophageal reflux disease)   . History of DVT (deep vein thrombosis)   . History of tobacco abuse    a. Quit  2011.  . Hyperlipidemia   . Hypertension   . Hypothyroidism   . Obesity   . Palpitations   . PE (pulmonary embolism) 10/15  . Personal history of radiation therapy   . PONV (postoperative nausea and vomiting)   . Pulmonary embolus (HCC) 12/25/2013   RLL. Presented with SOB and elevated d-dimer.   . PVD (peripheral vascular disease) (HCC)    a. PTA of right leg and left femoral artery with stenosis.  . Renal insufficiency   . Stroke (HCC) 2015   TIAs - no deficits    Patient Active Problem List   Diagnosis Date Noted  . Sepsis (HCC) 04/17/2020  . Anemia 02/16/2020  . Asthma 02/16/2020  . History of cardiac catheterization 02/16/2020  . History of left mastectomy 02/16/2020  . Malignant tumor of breast (HCC) 02/16/2020  . History of total knee arthroplasty 11/18/2019  . Unstable angina (HCC) 04/03/2019  . Dysphagia   . Thickening of esophagus   . Stomach irritation   . Gastric polyp   . Positive occult stool blood test   . Polyp of colon   . Hyperkalemia 11/27/2018  . Proteinuria 11/27/2018  . Type 2 diabetes mellitus with diabetic chronic kidney disease (HCC) 11/27/2018  . Carcinoma of lower-inner quadrant of left breast in female, estrogen receptor positive (HCC) 10/02/2018  . Right knee   pain 08/01/2017  . Chronic right-sided low back pain with right-sided sciatica 05/28/2017  . Gait abnormality 05/28/2017  . Hypothyroid 07/18/2016  . COPD (chronic obstructive pulmonary disease) (HCC) 07/18/2016  . Encephalomalacia on imaging study 12/10/2015  . Calcification of both carotid arteries 12/10/2015  . Benign neoplasm of sigmoid colon   . Benign neoplasm of cecum   . Benign neoplasm of ascending colon   . Bilateral atelectasis 04/11/2015  . Fatty liver disease, nonalcoholic 04/11/2015  . Hydronephrosis of right kidney 04/11/2015  . Abdominal aortic ectasia (HCC) 04/11/2015  . Constipation 04/07/2015  . Abdominal aortic atherosclerosis (HCC) 12/18/2014  .  Diverticulosis of colon 12/18/2014  . Rhinitis, allergic 10/20/2014  . Coronary artery disease   . Morbid obesity due to excess calories (HCC)   . Spinal stenosis of lumbar region 06/06/2014  . Herniated nucleus pulposus 06/06/2014  . Neuralgia neuritis, sciatic nerve 05/24/2014  . Pulmonary embolism (HCC) 01/30/2014  . Benign hypertensive renal disease 12/16/2013  . Uncontrolled type 2 diabetes mellitus with hyperglycemia, with long-term current use of insulin (HCC) 12/16/2013  . Chronic kidney disease 12/16/2013  . Frequent UTI 11/03/2013  . Overactive bladder 11/03/2013  . Coronary atherosclerosis of native coronary artery 06/19/2013  . History of back surgery 06/19/2013  . Hyperlipidemia 06/19/2013  . PAD (peripheral artery disease) (HCC) 06/19/2013  . Congenital obstruction of ureteropelvic junction 11/21/2011    Past Surgical History:  Procedure Laterality Date  . ANGIOPLASTY / STENTING FEMORAL     PVD; angioplasty right leg and left femoral artery with stenosis.   . APPENDECTOMY  Oct. 2016  . BACK SURGERY  03/2012   nerve stimulator inserted  . BACK SURGERY     screws, rods replaced with new hardware.  . BACK SURGERY  11/2016  . BREAST LUMPECTOMY Left 08/26/2018  . BREAST SURGERY    . CARDIAC CATHETERIZATION  12/07/2011   Mid LAD 50%, distal LAD 50%, mid RCA 70%, distal RCA 100%   . CARDIAC CATHETERIZATION  01/04/2011   100% occluded mid RCA with good collaterals from distal LAD, normal LVEF.  . CHOLECYSTECTOMY    . COLONOSCOPY  2013  . COLONOSCOPY WITH PROPOFOL N/A 05/24/2015   Procedure: COLONOSCOPY WITH PROPOFOL;  Surgeon: Darren Wohl, MD;  Location: [facility];  Service: Endoscopy;  Laterality: N/A;  Diabetic - oral meds  . COLONOSCOPY WITH PROPOFOL N/A 01/28/2019   Procedure: COLONOSCOPY WITH PROPOFOL;  Surgeon: Tahiliani, Varnita B, MD;  Location: [facility];  Service: Endoscopy;  Laterality: N/A;  . DIAGNOSTIC LAPAROSCOPY    .  ESOPHAGOGASTRODUODENOSCOPY (EGD) WITH PROPOFOL N/A 01/28/2019   Procedure: ESOPHAGOGASTRODUODENOSCOPY (EGD) WITH PROPOFOL;  Surgeon: Tahiliani, Varnita B, MD;  Location: [facility];  Service: Endoscopy;  Laterality: N/A;  . LAPAROSCOPIC APPENDECTOMY N/A 01/06/2015   Procedure: APPENDECTOMY LAPAROSCOPIC;  Surgeon: Seeplaputhur G Sankar, MD;  Location: [facility];  Service: General;  Laterality: N/A;  . LEFT HEART CATH AND CORONARY ANGIOGRAPHY Left 04/04/2019   Procedure: LEFT HEART CATH AND CORONARY ANGIOGRAPHY;  Surgeon: Gollan, Timothy J, MD;  Location: [facility];  Service: Cardiovascular;  Laterality: Left;  . MASTECTOMY    . MASTECTOMY W/ SENTINEL NODE BIOPSY Left 08/26/2018   Procedure: PARTIAL MASTECTOMY WIDE EXCISION WITH SENTINEL LYMPH NODE BIOPSY LEFT;  Surgeon: Byrnett, Jeffrey W, MD;  Location: [facility];  Service: General;  Laterality: Left;  . POLYPECTOMY  05/24/2015   Procedure: POLYPECTOMY;  Surgeon: Darren Wohl, MD;  Location: [facility];  Service: Endoscopy;;  .   THYROIDECTOMY    . TOTAL KNEE ARTHROPLASTY Right 11/13/2019  . TRIGGER FINGER RELEASE      Prior to Admission medications   Medication Sig Start Date End Date Taking? Authorizing Provider  anastrozole (ARIMIDEX) 1 MG tablet Take 1 tablet (1 mg total) by mouth daily. 03/04/20   Brahmanday, Govinda R, MD  blood glucose meter kit and supplies KIT Dispense based on patient and insurance preference. Use one time daily as directed, E11.9 04/23/15   Lada, Melinda P, MD  Calcium Carb-Cholecalciferol (CALCIUM 600 + D) 600-200 MG-UNIT TABS Take 1 tablet by mouth daily.     [provider]  cetirizine (ZYRTEC) 10 MG tablet Take 1 tablet (10 mg total) by mouth daily. 05/27/19   Johnson, Megan P, DO  cholecalciferol (VITAMIN D3) 25 MCG (1000 UT) tablet Take 1,000 Units by mouth daily.    [provider]  Dulaglutide (TRULICITY) 3 MG/0.5ML SOPN Inject 0.5 mLs (3 mg total) into the skin once a week.  11/11/19   Johnson, Megan P, DO  DULoxetine (CYMBALTA) 60 MG capsule TAKE 1 CAPSULE BY MOUTH DAILY 03/01/20   Johnson, Megan P, DO  ELIQUIS 2.5 MG TABS tablet TAKE 1 TABLET BY MOUTH TWICE DAILY 03/04/20   Gollan, Timothy J, MD  gabapentin (NEURONTIN) 300 MG capsule Take 1 capsule (300 mg total) by mouth 3 (three) times daily. 11/11/19   Johnson, Megan P, DO  glipiZIDE (GLUCOTROL) 5 MG tablet TAKE ONE TABLET BY MOUTH EVERY DAY BEFORE BREAKFAST 02/10/20   Johnson, Megan P, DO  insulin degludec (TRESIBA FLEXTOUCH) 100 UNIT/ML FlexTouch Pen Inject 0.24 mLs (24 Units total) into the skin daily. 11/11/19   Johnson, Megan P, DO  Insulin Pen Needle (PEN NEEDLES 3/16") 31G X 5 MM MISC 1 each by Does not apply route daily. 08/08/19   Johnson, Megan P, DO  isosorbide mononitrate (IMDUR) 60 MG 24 hr tablet TAKE ONE TABLET BY MOUTH AT BEDTIME AND HALF A TABLET EVERY MORNING 09/09/19   Johnson, Megan P, DO  JARDIANCE 25 MG TABS tablet TAKE ONE TABLET 25MG EVERY DAY BEFORE BREAKFAST 03/11/20   Johnson, Megan P, DO  ketoconazole (NIZORAL) 2 % shampoo Apply 1 application topically 4 (four) times a week. Wash scalp 4 times weekly, let sit 5 minutes and rinse out Patient not taking: Reported on 02/16/2020 12/25/19   Kowalski, David C, MD  Meth-Hyo-M Bl-Na Phos-Ph Sal (URO-MP) 118 MG CAPS TAKE 1 CAPSULE BY MOUTH 3 TIMES DAILY ASNEEDED ( URINARY FREQUENCY,URGENCY, BURNING. 03/08/20   Johnson, Megan P, DO  metoprolol succinate (TOPROL-XL) 50 MG 24 hr tablet TAKE ONE TABLET EVERY DAY WITH OR IMMEDIATELY FOLLOWING A MEAL 10/16/19   Johnson, Megan P, DO  mometasone (ELOCON) 0.1 % lotion Apply topically as directed. Qd up to 5 days a week to aa scalp until clear, then prn flares 12/25/19   Kowalski, David C, MD  montelukast (SINGULAIR) 10 MG tablet Take 1 tablet (10 mg total) by mouth at bedtime. 11/11/19   Johnson, Megan P, DO  Multiple Vitamins-Minerals (ZINC PO) Take 50 mg by mouth daily.     [provider]  MYRBETRIQ 50  MG TB24 tablet TAKE ONE TABLET EVERY DAY 01/26/20   Vaillancourt, Samantha, PA-C  nitroGLYCERIN (NITROSTAT) 0.4 MG SL tablet Place 1 tablet (0.4 mg total) under the tongue every 5 (five) minutes as needed for chest pain. 06/04/17   Gollan, Timothy J, MD  olopatadine (PATADAY) 0.1 % ophthalmic solution Place 1 drop into both eyes 2 (  two) times daily. 05/27/19   Johnson, Megan P, DO  pantoprazole (PROTONIX) 40 MG tablet Take 1 tablet (40 mg total) by mouth daily as needed. 11/11/19   Johnson, Megan P, DO  Polyethylene Glycol 400 (BLINK TEARS) 0.25 % SOLN Place 1 drop into both eyes 3 (three) times daily as needed (dry/irritated eyes.).     [provider]  predniSONE (STERAPRED UNI-PAK 21 TAB) 10 MG (21) TBPK tablet Take as directed. 04/09/20   Jacquelin Hawking, NP  PROAIR HFA 108 [phone] Base) MCG/ACT inhaler TAKE 2 PUFFS EVERY 4 HOURS AS NEEDED FORWHEEZING OR SHORTNESS OF BREATH 11/11/19   Johnson, Megan P, DO  rosuvastatin (CRESTOR) 40 MG tablet TAKE 1 TABLET BY MOUTH ONCE DAILY 03/01/20   Wynetta Emery, Megan P, DO  senna (SENOKOT) 8.6 MG TABS tablet SMARTSIG:2 Tablet(s) By Mouth Every 12 Hours PRN 11/14/19   [provider]  SYNTHROID 150 MCG tablet TAKE 1 TABLET EVERY DAY ON EMPTY STOMACHWITH A GLASS OF WATER AT LEAST 30-60 MINBEFORE BREAKFAST 02/10/20   Johnson, Megan P, DO  trimethoprim (TRIMPEX) 100 MG tablet TAKE ONE TABLET BY MOUTH EVERY DAY 03/08/20   Park Liter P, DO    Allergies Hydrocodone, Aleve [naproxen sodium], Contrast media [iodinated diagnostic agents], Ioxaglate, Oxycodone, Ranexa [ranolazine], Sulfa antibiotics, and Tramadol  Family History  Problem Relation Age of Onset  . Hypertension Father   . Heart disease Father   . Heart attack Father   . Diabetes Father   . Cancer Mother        colon  . Heart attack Brother   . Diabetes Brother   . Hypertension Brother   . Heart disease Brother   . Heart attack Brother   . Diabetes Brother   . Hypertension Brother    . Heart disease Brother   . Heart attack Brother   . Diabetes Brother   . Hypertension Brother   . Heart disease Brother   . Cancer Sister        lung  . COPD Sister   . COPD Sister   . Breast cancer Neg Hx     Social History Social History   Tobacco Use  . Smoking status: Former Smoker    Packs/day: 0.50    Years: 15.00    Pack years: 7.50    Types: Cigarettes    Quit date: 03/14/2007    Years since quitting: 13.1  . Smokeless tobacco: Never Used  Vaping Use  . Vaping Use: Never used  Substance Use Topics  . Alcohol use: No    Alcohol/week: 0.0 standard drinks  . Drug use: Never    Review of Systems Constitutional: No fever/chills.  General malaise and weakness. Eyes: No visual changes. ENT: No sore throat. Cardiovascular: Denies chest pain. Respiratory: Denies shortness of breath. Gastrointestinal: No abdominal pain.  No nausea, no vomiting.  No diarrhea.  No constipation. Genitourinary: Negative for dysuria. Musculoskeletal: Negative for neck pain.  Negative for back pain. Integumentary: Negative for rash. Neurological: Negative for headaches, focal weakness or numbness.  ____________________________________________   PHYSICAL EXAM:  VITAL SIGNS: ED Triage Vitals  Enc Vitals Group     BP 04/17/20 0333 129/67     Pulse Rate 04/17/20 0333 (!) 102     Resp 04/17/20 0333 (!) 22     Temp 04/17/20 0333 99.1 F (37.3 C)     Temp Source 04/17/20 0333 Oral     SpO2 04/17/20 0333 97 %     Weight 04/17/20  0337 94.3 kg (208 lb)     Height 04/17/20 0337 1.676 m (5' 6")     Head Circumference --      Peak Flow --      Pain Score --      Pain Loc --      Pain Edu? --      Excl. in [place]? --     Constitutional: Alert and oriented.  Eyes: Conjunctivae are normal.  Head: Atraumatic. Nose: No congestion/rhinnorhea. Mouth/Throat: Patient is wearing a mask. Neck: No stridor.  No meningeal signs.   Cardiovascular: Normal rate, regular rhythm. Good peripheral  circulation. Respiratory: Normal respiratory effort.  No retractions. Gastrointestinal: Soft and nontender. No distention.  Musculoskeletal: No lower extremity tenderness nor edema. No gross deformities of extremities. Neurologic:  Normal speech and language. No gross focal neurologic deficits are appreciated.  Skin:  Skin is warm, dry and intact. Psychiatric: Mood and affect are normal. Speech and behavior are normal.  ____________________________________________   LABS (all labs ordered are listed, but only abnormal results are displayed)  Labs Reviewed  BASIC METABOLIC PANEL - Abnormal; Notable for the following components:      Result Value   Glucose, Bld 221 (*)    BUN 25 (*)    Creatinine, Ser 1.73 (*)    Calcium 7.9 (*)    GFR, Estimated 30 (*)    All other components within normal limits  CBC - Abnormal; Notable for the following components:   WBC 16.2 (*)    All other components within normal limits  URINALYSIS, ROUTINE W REFLEX MICROSCOPIC - Abnormal; Notable for the following components:   Color, Urine YELLOW (*)    APPearance HAZY (*)    Glucose, UA >=500 (*)    Hgb urine dipstick SMALL (*)    Protein, ur 30 (*)    Leukocytes,Ua LARGE (*)    WBC, UA >50 (*)    All other components within normal limits  HEPATIC FUNCTION PANEL - Abnormal; Notable for the following components:   Albumin 3.3 (*)    All other components within normal limits  URINE CULTURE  SARS CORONAVIRUS 2 (TAT 6-24 HRS)  CULTURE, BLOOD (ROUTINE X 2)  CULTURE, BLOOD (ROUTINE X 2)  PROCALCITONIN  LIPASE, BLOOD  PROTIME-INR  APTT  LACTIC ACID, PLASMA  TROPONIN I (HIGH SENSITIVITY)  TROPONIN I (HIGH SENSITIVITY)   ____________________________________________  EKG  ED ECG REPORT I, Hinda Kehr, the attending physician, personally viewed and interpreted this ECG.  Date: 04/17/2020 EKG Time: 3:39 AM Rate: 97 Rhythm: normal sinus rhythm QRS Axis: Left axis deviation Intervals:  normal ST/T Wave abnormalities: Non-specific ST segment / T-wave changes, but no clear evidence of acute ischemia. Narrative Interpretation: no definitive evidence of acute ischemia; does not meet STEMI criteria.   ____________________________________________  RADIOLOGY I, Hinda Kehr, personally viewed and evaluated these images (plain radiographs) as part of my medical decision making, as well as reviewing the written report by the radiologist.  ED MD interpretation: No acute abnormality on chest x-ray  Official radiology report(s): DG Chest Port 1 View  Result Date: 04/17/2020 CLINICAL DATA:  Questionable sepsis EXAM: PORTABLE CHEST 1 VIEW COMPARISON:  07/17/2019 FINDINGS: Normal heart size when accounting for mediastinal fat pad. Aortic tortuosity. Hyperinflation. There is no edema, consolidation, effusion, or pneumothorax. Dorsal column stimulator leads. IMPRESSION: COPD without acute superimposed finding. Electronically Signed   By: Monte Fantasia M.D.   On: 04/17/2020 04:30    ____________________________________________   PROCEDURES  Procedure(s) performed (including Critical Care):  .Critical Care Performed by: Hinda Kehr, MD Authorized by: Hinda Kehr, MD   Critical care provider statement:    Critical care time (minutes):  30   Critical care time was exclusive of:  Separately billable procedures and treating other patients   Critical care was necessary to treat or prevent imminent or life-threatening deterioration of the following conditions:  Sepsis   Critical care was time spent personally by me on the following activities:  Development of treatment plan with patient or surrogate, discussions with consultants, evaluation of patient's response to treatment, examination of patient, obtaining history from patient or surrogate, ordering and performing treatments and interventions, ordering and review of laboratory studies, ordering and review of radiographic studies,  pulse oximetry, re-evaluation of patient's condition and review of old charts .1-3 Lead EKG Interpretation Performed by: Hinda Kehr, MD Authorized by: Hinda Kehr, MD     Interpretation: abnormal     ECG rate:  106   ECG rate assessment: tachycardic     Rhythm: sinus tachycardia     Ectopy: none     Conduction: normal       ____________________________________________   INITIAL IMPRESSION / MDM / ASSESSMENT AND PLAN / ED COURSE  As part of my medical decision making, I reviewed the following data within the [place] notes reviewed and incorporated, Labs reviewed , EKG interpreted , Old chart reviewed, Radiograph reviewed , Discussed with admitting physician (Dr. Damita Dunnings) and Notes from prior ED visits   Differential diagnosis includes, but is not limited to, sepsis, UTI, pneumonia, bacteremia, neoplasm complication, PE.  The patient is on the cardiac monitor to evaluate for evidence of arrhythmia and/or significant heart rate changes.  Patient is tachycardic and mildly tachypneic.  Blood work is pending.  I am initiating a possible sepsis work-up but will not initiate empiric antibiotics until I have more indication to do so.  She has no focal neurological deficits which is reassuring and I doubt an intracranial issue.  Patient is stable and comfortable at this time.  Will reassess after additional work-up is available.  Of note, chest x-ray was personally viewed by me and I agree with the radiologist report that there is no evidence of an acute abnormality such as pneumonia.     Clinical Course as of 04/17/20 0810  Sat Apr 17, 2020  0559 Urinalysis is grossly positive.  Urine culture is pending.  Hepatic function panel is within normal limits.  Basic metabolic panel demonstrates chronic kidney disease but no acute abnormalities.  Given her persistent tachycardia, leukocytosis of 16.2, and generalized weakness, and a known source (UTI), as well as some  tachypnea, she meets criteria for sepsis.  She does not meet criteria for severe sepsis nor septic shock given her normal lactic acid and normal blood pressure.  Procalcitonin is also pending.  I ordered 1 L of lactated Ringer's given the nonseptic shock presentation and ceftriaxone 1 g IV as per protocol for urinary tract infection.  I discussed the results with the patient and she agrees with the plan for admission.  I consulted the hospitalist. [CF]  [phone] Discussed case by phone with Dr. Damita Dunnings.  The hospitalist service will admit. [CF]    Clinical Course User Index [CF] Hinda Kehr, MD     ____________________________________________  FINAL CLINICAL IMPRESSION(S) / ED DIAGNOSES  Final diagnoses:  Sepsis, due to unspecified organism, unspecified whether acute organ dysfunction present [facility])  Urinary tract infection without hematuria,  site unspecified  Generalized weakness     MEDICATIONS GIVEN DURING THIS VISIT:  Medications  lactated ringers bolus 1,000 mL (1,000 mLs Intravenous New Bag/Given 04/17/20 0728)  cefTRIAXone (ROCEPHIN) 1 g in sodium chloride 0.9 % 100 mL IVPB (0 g Intravenous Stopped 04/17/20 0726)     ED Discharge Orders    None      *Please note:  Tammy [name] was evaluated in Emergency Department on 04/17/2020 for the symptoms described in the history of present illness. She was evaluated in the context of the global COVID-19 pandemic, which necessitated consideration that the patient might be at risk for infection with the SARS-CoV-2 virus that causes COVID-19. Institutional protocols and algorithms that pertain to the evaluation of patients at risk for COVID-19 are in a state of rapid change based on information released by regulatory bodies including the CDC and federal and state organizations. These policies and algorithms were followed during the patient's care in the ED.  Some ED evaluations and interventions may be delayed as a result of limited staffing during  and after the pandemic.*  Note:  This document was prepared using Dragon voice recognition software and may include unintentional dictation errors.   Forbach, Cory, MD 04/17/20 0810  

## 2020-04-17 NOTE — Progress Notes (Signed)
CODE SEPSIS - PHARMACY COMMUNICATION  **Broad Spectrum Antibiotics should be administered within 1 hour of Sepsis diagnosis**  Time Code Sepsis Called/Page Received:  2/5 @ 0555  Antibiotics Ordered: Ceftriaxone 1 gm   Time of 1st antibiotic administration:  2/5 @ [phone]   Additional action taken by pharmacy: messaged RN @ [phone] to remind her to give abx   If necessary, Name of Provider/Nurse Contacted:  Bluford Kaufmann D ,PharmD Clinical Pharmacist  04/17/2020  6:54 AM

## 2020-04-17 NOTE — Progress Notes (Signed)
   04/17/20 1530  Clinical Encounter Type  Visited With Patient  Visit Type Initial  Referral From Nurse  Consult/Referral To Chaplain  Spiritual Encounters  Spiritual Needs Literature  Nurse requested an AD for Pt. I gave Nurses station one AD to deliver to Pt and I advised them I will call Pt to do the Education part. I attempted to reach Pt several times, but there was no answer. Chaplains will FU.

## 2020-04-17 NOTE — ED Notes (Signed)
Overdue meds are not verified by pharmacy yet. Sent msg to pharmacy re: levothyroxine dose.

## 2020-04-17 NOTE — ED Notes (Signed)
Placed patient on purewick.

## 2020-04-17 NOTE — H&P (Signed)
History and Physical    Tammy [name] PTW:[id number] DOB: June 08, 1945 DOA: 04/17/2020  PCP: [name], DO   Patient coming from: Home  I have personally briefly reviewed patient's old medical records in [place]  Chief Complaint: Weakness  HPI: Tammy [name] is a 75 y.o. female with medical history significant for breast cancer status post radiation therapy, history of venous thromboembolic disease on anticoagulation with Eliquis, history of diabetes mellitus with complications of chronic kidney disease stage III, COPD, hypothyroidism, hypertension and obesity who was brought into the emergency room by EMS for evaluation following a fall.  Per patient she started having " the shakes" which always precedes a fall.  She denied having chills.  She slid to the ground and was unable to get up.  According to the patient she has had episodes like this in the past.  Husband was unable to get her up and so EMS was called. Patient states that she has not felt well in the last couple of days and has had nausea but no vomiting.  She recently completed antibiotic treatment as well as systemic steroids for pneumonia and thinks that she may not have recovered fully yet.  She denies having any chest pain, no sore throat, no fever, no shortness of breath, no vomiting, no abdominal pain, no urinary frequency, nocturia or dysuria, no fever, no chills, no cough.  She denies any dizziness, no lightheadedness, no loss of consciousness. Labs show sodium 137, potassium 4.1, chloride 106, bicarb 23, BUN 25, creatinine 1.73, calcium 7.9, alkaline phosphatase 68, albumin 3.3, lipase 42, AST 15, ALT 11, total protein 6.8, troponin XII, lactic acid 0.9, procalcitonin less than 0.10, white count 16.2, hemoglobin 12.8, hematocrit 40.8, MCV 84.1, RDW 14.8, platelet count 202, PT 13.7, INR 1.1 Chest x-ray reviewed by me shows hyperinflated lung fields.  No infiltrates or effusion. Her coronavirus PCR test is still  pending    ED Course: Patient is a 75 year old female who was brought into the ER by EMS for evaluation following a fall at home.  Patient denies having any loss of consciousness and did not hit her head when she fell.  She actually slid down on the ground due to weakness.  Patient was noted to be tachycardic and tachypneic upon presentation to the ER and has pyuria.  She has leukocytosis but recently completed systemic steroid therapy.  Code sepsis was called to the ER and patient received a dose of IV Rocephin.  She will be admitted to the hospital for further evaluation.  Review of Systems: As per HPI otherwise all systems reviewed and negative.    Past Medical History:  Diagnosis Date  . Asthma   . Benign neoplasm of colon   . Breast cancer ([place]) 08/2018   left breast  . Cervical disc disease    "bulging" - no limitations per pt  . Chronic diastolic CHF (congestive heart failure) ([facility])    a. 07/2012 Echo: Nl EF. mild inferoseptal HK, Gr 2 DD, mild conc LVH, mild PR/TR, mild PAH.  . CKD (chronic kidney disease), stage III ([place])   . COPD (chronic obstructive pulmonary disease) ([place])   . Coronary artery disease    a. 11/2011 Cath: LAD 31md, LCX min irregs, RCA 70p, 1072mith L->R collats, EF 60%-->Med Rx.  . Diabetes mellitus without complication ([place]  . Fusion of lumbar spine 12/22/2015  . GERD (gastroesophageal reflux disease)   . History of DVT (deep vein thrombosis)   .  History of tobacco abuse    a. Quit 2011.  [name] Hyperlipidemia   . Hypertension   . Hypothyroidism   . Obesity   . Palpitations   . PE (pulmonary embolism) 10/15  . Personal history of radiation therapy   . PONV (postoperative nausea and vomiting)   . Pulmonary embolus ([place]) 12/25/2013   RLL. Presented with SOB and elevated d-dimer.   [name] PVD (peripheral vascular disease) ([place])    a. PTA of right leg and left femoral artery with stenosis.  . Renal insufficiency   . Stroke [facility]) 2015   TIAs - no deficits     Past Surgical History:  Procedure Laterality Date  . ANGIOPLASTY / STENTING FEMORAL     PVD; angioplasty right leg and left femoral artery with stenosis.   . APPENDECTOMY  Oct. 2016  . BACK SURGERY  03/2012   nerve stimulator inserted  . BACK SURGERY     screws, rods replaced with new hardware.  [name] BACK SURGERY  11/2016  . BREAST LUMPECTOMY Left 08/26/2018  . BREAST SURGERY    . CARDIAC CATHETERIZATION  12/07/2011   Mid LAD 50%, distal LAD 50%, mid RCA 70%, distal RCA 100%   . CARDIAC CATHETERIZATION  01/04/2011   100% occluded mid RCA with good collaterals from distal LAD, normal LVEF.  [name] CHOLECYSTECTOMY    . COLONOSCOPY  2013  . COLONOSCOPY WITH PROPOFOL N/A 05/24/2015   Procedure: COLONOSCOPY WITH PROPOFOL;  Surgeon: [name], MD;  Location: [facility];  Service: Endoscopy;  Laterality: N/A;  Diabetic - oral meds  . COLONOSCOPY WITH PROPOFOL N/A 01/28/2019   Procedure: COLONOSCOPY WITH PROPOFOL;  Surgeon: [name], MD;  Location: [facility];  Service: Endoscopy;  Laterality: N/A;  . DIAGNOSTIC LAPAROSCOPY    . ESOPHAGOGASTRODUODENOSCOPY (EGD) WITH PROPOFOL N/A 01/28/2019   Procedure: ESOPHAGOGASTRODUODENOSCOPY (EGD) WITH PROPOFOL;  Surgeon: [name], MD;  Location: [facility];  Service: Endoscopy;  Laterality: N/A;  . LAPAROSCOPIC APPENDECTOMY N/A 01/06/2015   Procedure: APPENDECTOMY LAPAROSCOPIC;  Surgeon: [name], MD;  Location: [facility];  Service: General;  Laterality: N/A;  . LEFT HEART CATH AND CORONARY ANGIOGRAPHY Left 04/04/2019   Procedure: LEFT HEART CATH AND CORONARY ANGIOGRAPHY;  Surgeon: [name], MD;  Location: [facility];  Service: Cardiovascular;  Laterality: Left;  [name] MASTECTOMY    . MASTECTOMY W/ SENTINEL NODE BIOPSY Left 08/26/2018   Procedure: PARTIAL MASTECTOMY WIDE EXCISION WITH SENTINEL LYMPH NODE BIOPSY LEFT;  Surgeon: [name], MD;  Location: [facility];  Service: General;   Laterality: Left;  . POLYPECTOMY  05/24/2015   Procedure: POLYPECTOMY;  Surgeon: [name], MD;  Location: [facility];  Service: Endoscopy;;  . THYROIDECTOMY    . TOTAL KNEE ARTHROPLASTY Right 11/13/2019  . TRIGGER FINGER RELEASE       reports that she quit smoking about 13 years ago. Her smoking use included cigarettes. She has a 7.50 pack-year smoking history. She has never used smokeless tobacco. She reports that she does not drink alcohol and does not use drugs.  Allergies  Allergen Reactions  . Hydrocodone Other (See Comments)    Unresponsive  . Aleve [Naproxen Sodium]     Hives & itching   . Contrast Media [Iodinated Diagnostic Agents]     Pt avoids due to kidney issues  . Ioxaglate Other (See Comments)    (iodine) Pt avoids due to kidney issues   . Oxycodone Other (See Comments)  hallucinations  . Ranexa [Ranolazine]     Sick on stomach  . Sulfa Antibiotics Other (See Comments)    Pt avoids due to kidney issues  . Tramadol Other (See Comments)    nightmares    Family History  Problem Relation Age of Onset  . Hypertension Father   . Heart disease Father   . Heart attack Father   . Diabetes Father   . Cancer Mother        colon  . Heart attack Brother   . Diabetes Brother   . Hypertension Brother   . Heart disease Brother   . Heart attack Brother   . Diabetes Brother   . Hypertension Brother   . Heart disease Brother   . Heart attack Brother   . Diabetes Brother   . Hypertension Brother   . Heart disease Brother   . Cancer Sister        lung  . COPD Sister   . COPD Sister   . Breast cancer Neg Hx      Prior to Admission medications   Medication Sig Start Date End Date Taking? Authorizing Provider  anastrozole (ARIMIDEX) 1 MG tablet Take 1 tablet (1 mg total) by mouth daily. 03/04/20   Cammie Sickle, MD  blood glucose meter kit and supplies KIT Dispense based on patient and insurance preference. Use one time daily as directed,  E11.9 04/23/15   Lada, Satira Anis, MD  Calcium Carb-Cholecalciferol (CALCIUM 600 + D) 600-200 MG-UNIT TABS Take 1 tablet by mouth daily.     [provider]  cetirizine (ZYRTEC) 10 MG tablet Take 1 tablet (10 mg total) by mouth daily. 05/27/19   Johnson, Megan P, DO  cholecalciferol (VITAMIN D3) 25 MCG (1000 UT) tablet Take 1,000 Units by mouth daily.    [provider]  Dulaglutide (TRULICITY) 3 VC/9.4WH SOPN Inject 0.5 mLs (3 mg total) into the skin once a week. 11/11/19   Johnson, Megan P, DO  DULoxetine (CYMBALTA) 60 MG capsule TAKE 1 CAPSULE BY MOUTH DAILY 03/01/20   Johnson, Megan P, DO  ELIQUIS 2.5 MG TABS tablet TAKE 1 TABLET BY MOUTH TWICE DAILY 03/04/20   [name], MD  gabapentin (NEURONTIN) 300 MG capsule Take 1 capsule (300 mg total) by mouth 3 (three) times daily. 11/11/19   Johnson, Megan P, DO  glipiZIDE (GLUCOTROL) 5 MG tablet TAKE ONE TABLET BY MOUTH EVERY DAY BEFORE BREAKFAST 02/10/20   Johnson, Megan P, DO  insulin degludec (TRESIBA FLEXTOUCH) 100 UNIT/ML FlexTouch Pen Inject 0.24 mLs (24 Units total) into the skin daily. 11/11/19   Johnson, Megan P, DO  Insulin Pen Needle (PEN NEEDLES 3/16") 31G X 5 MM MISC 1 each by Does not apply route daily. 08/08/19   Johnson, Megan P, DO  isosorbide mononitrate (IMDUR) 60 MG 24 hr tablet TAKE ONE TABLET BY MOUTH AT BEDTIME AND HALF A TABLET EVERY MORNING 09/09/19   Johnson, Megan P, DO  JARDIANCE 25 MG TABS tablet TAKE ONE TABLET 25MG EVERY DAY BEFORE BREAKFAST 03/11/20   Johnson, Megan P, DO  ketoconazole (NIZORAL) 2 % shampoo Apply 1 application topically 4 (four) times a week. Wash scalp 4 times weekly, let sit 5 minutes and rinse out Patient not taking: Reported on 02/16/2020 12/25/19   Ralene Bathe, MD  Meth-Hyo-M Bl-Na Phos-Ph Sal (URO-MP) 118 MG CAPS TAKE 1 CAPSULE BY MOUTH 3 TIMES DAILY ASNEEDED ( URINARY FREQUENCY,URGENCY, BURNING. 03/08/20   Johnson, Megan P, DO  metoprolol  succinate (TOPROL-XL) 50 MG 24 hr  tablet TAKE ONE TABLET EVERY DAY WITH OR IMMEDIATELY FOLLOWING A MEAL 10/16/19   Johnson, Megan P, DO  mometasone (ELOCON) 0.1 % lotion Apply topically as directed. Qd up to 5 days a week to aa scalp until clear, then prn flares 12/25/19   Ralene Bathe, MD  montelukast (SINGULAIR) 10 MG tablet Take 1 tablet (10 mg total) by mouth at bedtime. 11/11/19   Johnson, Megan P, DO  Multiple Vitamins-Minerals (ZINC PO) Take 50 mg by mouth daily.     [provider]  MYRBETRIQ 50 MG TB24 tablet TAKE ONE TABLET EVERY DAY 01/26/20   Vaillancourt, Aldona Bar, PA-C  nitroGLYCERIN (NITROSTAT) 0.4 MG SL tablet Place 1 tablet (0.4 mg total) under the tongue every 5 (five) minutes as needed for chest pain. 06/04/17   [name], MD  olopatadine (PATADAY) 0.1 % ophthalmic solution Place 1 drop into both eyes 2 (two) times daily. 05/27/19   Johnson, Megan P, DO  pantoprazole (PROTONIX) 40 MG tablet Take 1 tablet (40 mg total) by mouth daily as needed. 11/11/19   Johnson, Megan P, DO  Polyethylene Glycol 400 (BLINK TEARS) 0.25 % SOLN Place 1 drop into both eyes 3 (three) times daily as needed (dry/irritated eyes.).     [provider]  predniSONE (STERAPRED UNI-PAK 21 TAB) 10 MG (21) TBPK tablet Take as directed. 04/09/20   Jacquelin Hawking, NP  PROAIR HFA [id number] Base) MCG/ACT inhaler TAKE 2 PUFFS EVERY 4 HOURS AS NEEDED FORWHEEZING OR SHORTNESS OF BREATH 11/11/19   Johnson, Megan P, DO  rosuvastatin (CRESTOR) 40 MG tablet TAKE 1 TABLET BY MOUTH ONCE DAILY 03/01/20   Wynetta Emery, Megan P, DO  senna (SENOKOT) 8.6 MG TABS tablet SMARTSIG:2 Tablet(s) By Mouth Every 12 Hours PRN 11/14/19   [provider]  SYNTHROID 150 MCG tablet TAKE 1 TABLET EVERY DAY ON EMPTY STOMACHWITH A GLASS OF WATER AT LEAST 30-60 MINBEFORE BREAKFAST 02/10/20   Johnson, Megan P, DO  trimethoprim (TRIMPEX) 100 MG tablet TAKE ONE TABLET BY MOUTH EVERY DAY 03/08/20   [name], DO    Physical Exam: Vitals:   04/17/20  0730 04/17/20 0800 04/17/20 0900 04/17/20 0930  BP: (!) 111/58 128/74 132/77 133/65  Pulse: 95 94 95 94  Resp: (!) 21 20 (!) 21 (!) 26  Temp:      TempSrc:      SpO2: 93% 98% 96% 95%  Weight:      Height:         Vitals:   04/17/20 0730 04/17/20 0800 04/17/20 0900 04/17/20 0930  BP: (!) 111/58 128/74 132/77 133/65  Pulse: 95 94 95 94  Resp: (!) 21 20 (!) 21 (!) 26  Temp:      TempSrc:      SpO2: 93% 98% 96% 95%  Weight:      Height:        Constitutional: NAD, alert and oriented x 3 Eyes: PERRL, lids and conjunctivae normal ENMT: Mucous membranes are moist.  Neck: normal, supple, no masses, no thyromegaly Respiratory: clear to auscultation bilaterally, no wheezing, no crackles. Normal respiratory effort. No accessory muscle use.  Cardiovascular: Regular rate and rhythm, no murmurs / rubs / gallops. No extremity edema. 2+ pedal pulses. No carotid bruits.  Abdomen: no tenderness, no masses palpated. No hepatosplenomegaly. Bowel sounds positive.  Musculoskeletal: no clubbing / cyanosis. No joint deformity upper and lower extremities.  Skin: no rashes, lesions, ulcers.  Neurologic:  No gross focal neurologic deficit.  Able to move all extremities.  Generalized weakness. Psychiatric: Normal mood and affect.   Labs on Admission: I have personally reviewed following labs and imaging studies  CBC: Recent Labs  Lab 04/17/20 0340  WBC 16.2*  HGB 12.8  HCT 40.8  MCV 84.1  PLT 250   Basic Metabolic Panel: Recent Labs  Lab 04/17/20 0340  NA 137  K 4.1  CL 106  CO2 23  GLUCOSE 221*  BUN 25*  CREATININE 1.73*  CALCIUM 7.9*   GFR: Estimated Creatinine Clearance: 32 mL/min (A) (by C-G formula based on SCr of 1.73 mg/dL (H)). Liver Function Tests: Recent Labs  Lab 04/17/20 0450  AST 15  ALT 11  ALKPHOS 68  BILITOT 0.8  PROT 6.8  ALBUMIN 3.3*   Recent Labs  Lab 04/17/20 0450  LIPASE 42   No results for input(s): AMMONIA in the last 168 hours. Coagulation  Profile: Recent Labs  Lab 04/17/20 0450  INR 1.1   Cardiac Enzymes: No results for input(s): CKTOTAL, CKMB, CKMBINDEX, TROPONINI in the last 168 hours. BNP (last 3 results) No results for input(s): PROBNP in the last 8760 hours. HbA1C: No results for input(s): HGBA1C in the last 72 hours. CBG: No results for input(s): GLUCAP in the last 168 hours. Lipid Profile: No results for input(s): CHOL, HDL, LDLCALC, TRIG, CHOLHDL, LDLDIRECT in the last 72 hours. Thyroid Function Tests: No results for input(s): TSH, T4TOTAL, FREET4, T3FREE, THYROIDAB in the last 72 hours. Anemia Panel: No results for input(s): VITAMINB12, FOLATE, FERRITIN, TIBC, IRON, RETICCTPCT in the last 72 hours. Urine analysis:    Component Value Date/Time   COLORURINE YELLOW (A) 04/17/2020 0450   APPEARANCEUR HAZY (A) 04/17/2020 0450   APPEARANCEUR Cloudy (A) 10/15/2019 1112   LABSPEC 1.025 04/17/2020 0450   PHURINE 5.0 04/17/2020 0450   GLUCOSEU >=500 (A) 04/17/2020 0450   HGBUR SMALL (A) 04/17/2020 0450   BILIRUBINUR NEGATIVE 04/17/2020 0450   BILIRUBINUR Negative 10/15/2019 1112   KETONESUR NEGATIVE 04/17/2020 0450   PROTEINUR 30 (A) 04/17/2020 0450   UROBILINOGEN 0.2 06/24/2015 0919   NITRITE NEGATIVE 04/17/2020 0450   LEUKOCYTESUR LARGE (A) 04/17/2020 0450    Radiological Exams on Admission: DG Chest Port 1 View  Result Date: 04/17/2020 CLINICAL DATA:  Questionable sepsis EXAM: PORTABLE CHEST 1 VIEW COMPARISON:  07/17/2019 FINDINGS: Normal heart size when accounting for mediastinal fat pad. Aortic tortuosity. Hyperinflation. There is no edema, consolidation, effusion, or pneumothorax. Dorsal column stimulator leads. IMPRESSION: COPD without acute superimposed finding. Electronically Signed   By: Monte Fantasia M.D.   On: 04/17/2020 04:30    EKG: Independently reviewed.   Assessment/Plan Principal Problem:   Sepsis ([place]) Active Problems:   PAD (peripheral artery disease) ([facility])   Essential  hypertension   Hypothyroid   COPD (chronic obstructive pulmonary disease) ([facility])   Carcinoma of lower-inner quadrant of left breast in female, estrogen receptor positive ([place])   Type 2 diabetes mellitus with stage 3 chronic kidney disease ([place])   Acute lower UTI      Sepsis from urinary sepsis (POA) As evidenced by tachycardia, tachypnea, leukocytosis with pyuria Patient was recently on systemic steroids which could account for some of the leukocytosis She has a history of frequent UTIs and prior urine culture yielded Klebsiella pneumonia sensitive to cephalosporins We will place patient empirically on Rocephin 1 g IV daily while awaiting results of urine culture Judicious IV fluid resuscitation Follow-up results of blood cultures  Type 2 diabetes mellitus with complications of stage III chronic kidney disease Maintain consistent carbohydrate diet Glycemic control with Accu-Cheks before meals and at bedtime    History of venous thromboembolic disease Continue Apixaban    Hypothyroidism Continue Synthroid    History of breast cancer Continue Arimidex    History of coronary artery disease Continue metoprolol, statins and nitrates    COPD Not acutely exacerbated Continue as needed bronchodilator therapy    Status post fall Place patient on fall precautions PT evaluate and treat    DVT prophylaxis: Apixaban Code Status: Full code Family Communication: Greater than 50% of time was spent discussing patient's condition and plan of care at bedside.  All questions and concerns have been addressed.  They verbalized understanding and agreed with the plan. Disposition Plan: Back to previous home environment Consults called: Physical therapy    Edsel Shives MD [facility]     04/17/2020, 9:43 AM

## 2020-04-17 NOTE — ED Notes (Signed)
No acute changes-- pt remains a&ox4.  No new complaints verbalized at this time.  Pt able to roll with min assist in bed to be placed on bedpan and urine collected after pt voiding -- additional bloodwork collected via saline lock - well tolerated by pt.  Pt denies any additional questions, concerns, needs at this time.  Spouse at bedside.

## 2020-04-17 NOTE — Progress Notes (Signed)
   04/17/20 1745  Clinical Encounter Type  Visited With Patient and family together  Visit Type Follow-up  Referral From Nurse  Consult/Referral To [facility] did a follow up visit with Pt. I spoke to Pt's husband Tammy [name] who stated clearly no he and his wife do not want to do an AD.

## 2020-04-17 NOTE — ED Notes (Signed)
PT at bedside with pt.  

## 2020-04-17 NOTE — ED Triage Notes (Signed)
Pt arrives from home via [facility] -- cc: witnessed fall -- pt reportedly slid out of recliner chair with RLE pain onset prior to sliding out of chair -- pt unable to stand without assistance pior to arrival -- EMS reports EKG changes with n/v on scene.  BGL 272 on scene and repeat of 289 prior to arrival.  Recent tx for PNA with PO ABX and PO Prednisone and recent diagnosis of stage 1 Lung Ca. 2 days pta (L lobe) /  H/O CVA 2014 - no known deficits and currently on Eliquis.  20G L AC placed by ems prior to arrival.

## 2020-04-17 NOTE — Telephone Encounter (Signed)
On 2/04-two patient regarding the discussion at the tumor conference re: the plan to follow with repeat CT scan in 3 months.

## 2020-04-18 DIAGNOSIS — Y92009 Unspecified place in unspecified non-institutional (private) residence as the place of occurrence of the external cause: Secondary | ICD-10-CM

## 2020-04-18 DIAGNOSIS — W19XXXA Unspecified fall, initial encounter: Secondary | ICD-10-CM | POA: Diagnosis not present

## 2020-04-18 DIAGNOSIS — A419 Sepsis, unspecified organism: Secondary | ICD-10-CM | POA: Diagnosis not present

## 2020-04-18 LAB — BASIC METABOLIC PANEL
Anion gap: 9 (ref 5–15)
BUN: 16 mg/dL (ref 8–23)
CO2: 20 mmol/L — ABNORMAL LOW (ref 22–32)
Calcium: 8.1 mg/dL — ABNORMAL LOW (ref 8.9–10.3)
Chloride: 104 mmol/L (ref 98–111)
Creatinine, Ser: 1.57 mg/dL — ABNORMAL HIGH (ref 0.44–1.00)
GFR, Estimated: 34 mL/min — ABNORMAL LOW (ref >60)
Glucose, Bld: 127 mg/dL — ABNORMAL HIGH (ref 70–99)
Potassium: 4.3 mmol/L (ref 3.5–5.1)
Sodium: 133 mmol/L — ABNORMAL LOW (ref 135–145)

## 2020-04-18 LAB — CBC
HCT: 38 % (ref 36.0–46.0)
Hemoglobin: 11.8 g/dL — ABNORMAL LOW (ref 12.0–15.0)
MCH: 26.3 pg (ref 26.0–34.0)
MCHC: 31.1 g/dL (ref 30.0–36.0)
MCV: 84.8 fL (ref 80.0–100.0)
Platelets: 224 K/uL (ref 150–400)
RBC: 4.48 MIL/uL (ref 3.87–5.11)
RDW: 15 % (ref 11.5–15.5)
WBC: 14.2 K/uL — ABNORMAL HIGH (ref 4.0–10.5)
nRBC: 0 % (ref 0.0–0.2)

## 2020-04-18 LAB — GLUCOSE, CAPILLARY
Glucose-Capillary: 127 mg/dL — ABNORMAL HIGH (ref 70–99)
Glucose-Capillary: 119 mg/dL — ABNORMAL HIGH (ref 70–99)

## 2020-04-18 LAB — CORTISOL-AM, BLOOD: Cortisol - AM: 19.1 ug/dL (ref 6.7–22.6)

## 2020-04-18 LAB — PROTIME-INR
INR: 1.2 (ref 0.8–1.2)
Prothrombin Time: 14.9 seconds (ref 11.4–15.2)

## 2020-04-18 LAB — URINE CULTURE

## 2020-04-18 LAB — PROCALCITONIN: Procalcitonin: 0.12 ng/mL

## 2020-04-18 NOTE — Care Management Obs Status (Signed)
[facility] NOTIFICATION   Patient Details  Name: Tammy [name] MRN: [id number] Date of Birth: Apr 16, 1945   Medicare Observation Status Notification Given:  Yes    Boris Sharper, LCSW 04/18/2020, 11:22 AM

## 2020-04-18 NOTE — Discharge Summary (Signed)
Physician Discharge Summary   Tammy [name]  female DOB: 13-Jul-1945  EEF:[id number]  PCP: [name], DO  Admit date: 04/17/2020 Discharge date: 04/18/2020  Admitted From: home Disposition:  Home.   Husband updated at bedside prior to discharge.  Home Health: Yes HHPT CODE STATUS: Full code  Discharge Instructions    Discharge instructions   Complete by: As directed    Your symptoms and workup do not suggest an urinary track infection.    Next time you have twitching in your legs, be sure not to get up until the episode is over.   Dr. Enzo Bi [facility] Course:  For full details, please see H&P, progress notes, consult notes and ancillary notes.  Briefly,  Tammy [name] is a 75 y.o. female with medical history significant for breast cancer status post radiation therapy, history of venous thromboembolic disease on anticoagulation with Eliquis, history of diabetes mellitus with complications of chronic kidney disease stage III, COPD, hypothyroidism, hypertension and obesity who was brought into the emergency room by EMS for evaluation following a fall.  Per patient she started having " the shakes" which always precedes a fall.  She denied having chills.  She slid to the ground and was unable to get up.  According to the patient she has had episodes like this in the past.  Husband was unable to get her up and so EMS was called.  Sepsis, ruled out UTI, ruled out Asymptomatic pyuria Pt had no fever, no urinary symptoms, and presented due to weakness and fall.  Leukocytosis was due to recent use of steroid prescribed outpatient.  Pt was started on IV ceftriaxone on presentation, which was not continued.  Urine cx later returned multiple species, suggesting chronic colonization.    Status post fall following myoclonic jerk Pt reported that her legs started to "twitch", and then when she tried to stand up, she slide to the ground, unable to get up.  Similar events had  happened before.  Pt didn't loose consciousness.  Pt likely had myoclonic jerks.  Pt was advised to remain seated next time when she has the twitches, to avoid falling to the ground.  Type 2 diabetes mellitus poorly controlled with complications of chronic kidney disease A1c 8.6. Pt discharged back on home diabetic regimen.  stage IIIb chronic kidney disease Cr stable at baseline.  History of venous thromboembolic disease Continue Apixaban  Hypothyroidism Continue Synthroid  History of breast cancer Continue Arimidex  History of coronary artery disease Continue metoprolol, statins and nitrates  COPD Not acutely exacerbated Continue as needed bronchodilator therapy    Discharge Diagnoses:  Principal Problem:   Sepsis ([place]) Active Problems:   PAD (peripheral artery disease) ([place])   Essential hypertension   Hypothyroid   COPD (chronic obstructive pulmonary disease) (H[facility])   Carcinoma of lower-inner quadrant of left breast in female, estrogen receptor positive ([place])   Type 2 diabetes mellitus with stage 3 chronic kidney disease ([place])   Acute lower UTI   Fall    Discharge Instructions:  Allergies as of 04/18/2020      Reactions   Hydrocodone Other (See Comments)   Unresponsive   Aleve [naproxen Sodium]    Hives & itching    Contrast Media [iodinated Diagnostic Agents]    Pt avoids due to kidney issues   Ioxaglate Other (See Comments)   (iodine) Pt avoids due to kidney issues   Oxycodone Other (See Comments)  hallucinations   Ranexa [ranolazine]    Sick on stomach   Sulfa Antibiotics Other (See Comments)   Pt avoids due to kidney issues   Tramadol Other (See Comments)   nightmares      Medication List    STOP taking these medications   olopatadine 0.1 % ophthalmic solution Commonly known as: Pataday   predniSONE 10 MG (21) Tbpk tablet Commonly known as: STERAPRED UNI-PAK 21 TAB     TAKE these medications   anastrozole 1 MG tablet Commonly  known as: ARIMIDEX Take 1 tablet (1 mg total) by mouth daily.   Blink Tears 0.25 % Soln Generic drug: Polyethylene Glycol 400 Place 1 drop into both eyes 3 (three) times daily as needed (dry/irritated eyes.).   blood glucose meter kit and supplies Kit Dispense based on patient and insurance preference. Use one time daily as directed, E11.9   Calcium 600 + D 600-200 MG-UNIT Tabs Generic drug: Calcium Carbonate+Vitamin D Take 1 tablet by mouth daily.   cetirizine 10 MG tablet Commonly known as: ZYRTEC Take 1 tablet (10 mg total) by mouth daily.   cholecalciferol 25 MCG (1000 UNIT) tablet Commonly known as: VITAMIN D3 Take 1,000 Units by mouth daily.   DULoxetine 60 MG capsule Commonly known as: CYMBALTA TAKE 1 CAPSULE BY MOUTH DAILY   Eliquis 2.5 MG Tabs tablet Generic drug: apixaban TAKE 1 TABLET BY MOUTH TWICE DAILY What changed: how much to take   gabapentin 300 MG capsule Commonly known as: NEURONTIN Take 1 capsule (300 mg total) by mouth 3 (three) times daily.   glipiZIDE 5 MG tablet Commonly known as: GLUCOTROL TAKE ONE TABLET BY MOUTH EVERY DAY BEFORE BREAKFAST What changed:   how much to take  how to take this  when to take this  additional instructions   isosorbide mononitrate 60 MG 24 hr tablet Commonly known as: IMDUR TAKE ONE TABLET BY MOUTH AT BEDTIME AND HALF A TABLET EVERY MORNING What changed: See the new instructions.   Jardiance 25 MG Tabs tablet Generic drug: empagliflozin TAKE ONE TABLET 25MG EVERY DAY BEFORE BREAKFAST What changed: See the new instructions.   metoprolol succinate 50 MG 24 hr tablet Commonly known as: TOPROL-XL TAKE ONE TABLET EVERY DAY WITH OR IMMEDIATELY FOLLOWING A MEAL What changed:   how much to take  how to take this  when to take this  additional instructions   mometasone 0.1 % lotion Commonly known as: ELOCON Apply topically as directed. Qd up to 5 days a week to aa scalp until clear, then prn  flares What changed:   how much to take  when to take this  reasons to take this  additional instructions   montelukast 10 MG tablet Commonly known as: SINGULAIR Take 1 tablet (10 mg total) by mouth at bedtime.   Myrbetriq 50 MG Tb24 tablet Generic drug: mirabegron ER TAKE ONE TABLET EVERY DAY What changed: how much to take   nitroGLYCERIN 0.4 MG SL tablet Commonly known as: NITROSTAT Place 1 tablet (0.4 mg total) under the tongue every 5 (five) minutes as needed for chest pain.   pantoprazole 40 MG tablet Commonly known as: PROTONIX Take 1 tablet (40 mg total) by mouth daily as needed. What changed: when to take this   Pen Needles 3/16" 31G X 5 MM Misc 1 each by Does not apply route daily.   ProAir HFA 108 (90 Base) MCG/ACT inhaler Generic drug: albuterol TAKE 2 PUFFS EVERY 4 HOURS AS NEEDED FORWHEEZING OR  SHORTNESS OF BREATH   rosuvastatin 40 MG tablet Commonly known as: CRESTOR TAKE 1 TABLET BY MOUTH ONCE DAILY   senna 8.6 MG Tabs tablet Commonly known as: SENOKOT Take 8.6 mg by mouth daily as needed for mild constipation.   Synthroid 150 MCG tablet Generic drug: levothyroxine TAKE 1 TABLET EVERY DAY ON EMPTY STOMACHWITH A GLASS OF WATER AT LEAST 30-60 MINBEFORE BREAKFAST What changed: See the new instructions.   Tyler Aas FlexTouch 100 UNIT/ML FlexTouch Pen Generic drug: insulin degludec Inject 0.24 mLs (24 Units total) into the skin daily. What changed: when to take this   trimethoprim 100 MG tablet Commonly known as: TRIMPEX TAKE ONE TABLET BY MOUTH EVERY DAY   Trulicity 3 WY/6.3ZC Sopn Generic drug: Dulaglutide Inject 0.5 mLs (3 mg total) into the skin once a week. What changed: when to take this   Uro-MP 118 MG Caps TAKE 1 CAPSULE BY MOUTH 3 TIMES DAILY ASNEEDED ( URINARY FREQUENCY,URGENCY, BURNING. What changed: See the new instructions.        Follow-up Information    Park Liter P, DO. Schedule an appointment as soon as possible for a  visit in 1 week(s).   Specialty: Family Medicine Contact information: [place] [address] [phone]        [name], MD .   Specialty: Cardiology Contact information: [address] [place] [address] [phone]               Allergies  Allergen Reactions  . Hydrocodone Other (See Comments)    Unresponsive  . Aleve [Naproxen Sodium]     Hives & itching   . Contrast Media [Iodinated Diagnostic Agents]     Pt avoids due to kidney issues  . Ioxaglate Other (See Comments)    (iodine) Pt avoids due to kidney issues   . Oxycodone Other (See Comments)    hallucinations  . Ranexa [Ranolazine]     Sick on stomach  . Sulfa Antibiotics Other (See Comments)    Pt avoids due to kidney issues  . Tramadol Other (See Comments)    nightmares     The results of significant diagnostics from this hospitalization (including imaging, microbiology, ancillary and laboratory) are listed below for reference.   Consultations:   Procedures/Studies: CT Chest Wo Contrast  Result Date: 03/23/2020 CLINICAL DATA:  Followup pulmonary nodules. EXAM: CT CHEST WITHOUT CONTRAST TECHNIQUE: Multidetector CT imaging of the chest was performed following the standard protocol without IV contrast. COMPARISON:  CT scans 07/17/2019 and 03/11/2018 FINDINGS: Cardiovascular: The heart is normal in size. No pericardial effusion. Stable tortuosity, ectasia and calcification of the thoracic aorta. No focal aneurysm. Maximum diameter of the descending thoracic aorta is 3.4 cm. Mediastinum/Nodes: Stable scattered mediastinal and hilar lymph nodes but no mass or overt adenopathy. The esophagus is grossly normal. Lungs/Pleura: Slight interval enlargement of the peripheral left upper lobe pulmonary nodule it measures 8 x 7 mm on image number 55 of series 3. Previously measured 6 x 6 mm when remeasured in the same planes. The smaller more posterior subpleural nodule in the left  upper lobe on image number 52/3 is unchanged at 3 mm. Stable emphysematous changes and areas of pulmonary scarring. No new pulmonary nodules. No acute pulmonary findings. Stable basilar scarring changes. No pleural effusions or pleural nodules. Upper Abdomen: No significant upper abdominal findings. Stable aortic calcifications and mild ectasia. No upper abdominal adenopathy. Musculoskeletal: No significant bony findings. Spinal cord stimulator noted the  midthoracic area. IMPRESSION: 1. Slight interval enlargement of the peripheral left upper lobe pulmonary nodule. At this point options would include PET-CT or continued short-term follow-up chest CT in 3-4 months. 2. 3 mm subpleural left upper lobe pulmonary nodule is stable. 3. Stable emphysematous changes and pulmonary scarring. No acute pulmonary findings. 4. Stable scattered mediastinal and hilar lymph nodes but no mass or adenopathy. 5. Stable atherosclerotic calcifications involving the thoracic and abdominal aorta. 6. Emphysema and aortic atherosclerosis. Aortic Atherosclerosis (ICD10-I70.0) and Emphysema (ICD10-J43.9). Electronically Signed   By: Marijo Sanes M.D.   On: 03/23/2020 15:39   NM PET Image Initial (PI) Skull Base To Thigh  Result Date: 04/09/2020 CLINICAL DATA:  Initial treatment strategy for pulmonary nodule. EXAM: NUCLEAR MEDICINE PET SKULL BASE TO THIGH TECHNIQUE: 11.4 mCi F-18 FDG was injected intravenously. Full-ring PET imaging was performed from the skull base to thigh after the radiotracer. CT data was obtained and used for attenuation correction and anatomic localization. Fasting blood glucose: 75 mg/dl COMPARISON:  Chest CT 03/23/2020, 07/17/2019 and 03/11/2018 FINDINGS: Mediastinal blood pool activity: SUV max 3.7 Liver activity: SUV max NA NECK: No hypermetabolic lymph nodes in the neck. Incidental CT findings: none CHEST: Nodule of concern in the lateral aspect of the LEFT upper lobe along the pleural surface measures 8 mm  (image 70) and has mild metabolic activity SUV max equal 2.4. This nodule has enlarged over time when compared to CT 03/11/2018 (3 mm) as described on recent diagnostic CT. There is new focus of segmental airspace disease in the lateral aspect of the RIGHT upper lobe with intense metabolic activity (SUV max equal 9.2. (Image 82). Moderate metabolic activity associated 11 mm RIGHT lower paratracheal lymph node with SUV max equal 4.2. This node is not changed in size from CT 2019. additionally high metabolic activity associated with a RIGHT hilar lymph node with SUV max equal 5.9. In the deep within the deep tissue of the LEFT breast there is angular thickening with metabolic activity (SUV max equal 3.8 on image 78. This Incidental CT findings: none ABDOMEN/PELVIS: No abnormal hypermetabolic activity within the liver, pancreas, adrenal glands, or spleen. No hypermetabolic lymph nodes in the abdomen or pelvis. Incidental CT findings: Atherosclerotic calcification of the aorta. Spinal cord stimulator device noted. Posterior lumbar fusion. Severe cortical thinning of the RIGHT kidney. SKELETON: Symmetric activity in the paraspinal musculature at T11 is favored benign. No evidence skeletal metastasis. Incidental CT findings: none IMPRESSION: 1. Mild metabolic activity associated with enlarging LEFT upper lobe subpleural pulmonary nodule. Combination of findings is concerning for bronchial neoplasm. Consider tissue sampling or resection. 2. New hypermetabolic airspace disease in the RIGHT upper lobe consistent with pneumonia. 3. Hypermetabolic RIGHT paratracheal and RIGHT hilar lymph nodes. Favor reactive adenopathy in light of the pneumonia in the RIGHT upper lobe. 4. Mild hypermetabolic tissue in the deep LEFT breast. Recommend correlation with recent diagnostic mammograms. Findings conveyed toJENNIFER BURNS on 04/09/2020  at9:00. Electronically Signed   By: Suzy Bouchard M.D.   On: 04/09/2020 10:31   DG Chest Port 1  View  Result Date: 04/17/2020 CLINICAL DATA:  Questionable sepsis EXAM: PORTABLE CHEST 1 VIEW COMPARISON:  07/17/2019 FINDINGS: Normal heart size when accounting for mediastinal fat pad. Aortic tortuosity. Hyperinflation. There is no edema, consolidation, effusion, or pneumothorax. Dorsal column stimulator leads. IMPRESSION: COPD without acute superimposed finding. Electronically Signed   By: Monte Fantasia M.D.   On: 04/17/2020 04:30      Labs: BNP (last 3 results) No  results for input(s): BNP in the last 8760 hours. Basic Metabolic Panel: Recent Labs  Lab 04/17/20 0340 04/18/20 0623  NA 137 133*  K 4.1 4.3  CL 106 104  CO2 23 20*  GLUCOSE 221* 127*  BUN 25* 16  CREATININE 1.73* 1.57*  CALCIUM 7.9* 8.1*   Liver Function Tests: Recent Labs  Lab 04/17/20 0450  AST 15  ALT 11  ALKPHOS 68  BILITOT 0.8  PROT 6.8  ALBUMIN 3.3*   Recent Labs  Lab 04/17/20 0450  LIPASE 42   No results for input(s): AMMONIA in the last 168 hours. CBC: Recent Labs  Lab 04/17/20 0340 04/18/20 0623  WBC 16.2* 14.2*  HGB 12.8 11.8*  HCT 40.8 38.0  MCV 84.1 84.8  PLT 302 224   Cardiac Enzymes: No results for input(s): CKTOTAL, CKMB, CKMBINDEX, TROPONINI in the last 168 hours. BNP: Invalid input(s): POCBNP CBG: Recent Labs  Lab 04/17/20 1219 04/17/20 1630 04/17/20 2206 04/18/20 0752  GLUCAP 170* 112* 125* 127*   D-Dimer No results for input(s): DDIMER in the last 72 hours. Hgb A1c Recent Labs    04/17/20 0340  HGBA1C 8.6*   Lipid Profile No results for input(s): CHOL, HDL, LDLCALC, TRIG, CHOLHDL, LDLDIRECT in the last 72 hours. Thyroid function studies No results for input(s): TSH, T4TOTAL, T3FREE, THYROIDAB in the last 72 hours.  Invalid input(s): FREET3 Anemia work up No results for input(s): VITAMINB12, FOLATE, FERRITIN, TIBC, IRON, RETICCTPCT in the last 72 hours. Urinalysis    Component Value Date/Time   COLORURINE YELLOW (A) 04/17/2020 0450   APPEARANCEUR  HAZY (A) 04/17/2020 0450   APPEARANCEUR Cloudy (A) 10/15/2019 1112   LABSPEC 1.025 04/17/2020 0450   PHURINE 5.0 04/17/2020 0450   GLUCOSEU >=500 (A) 04/17/2020 0450   HGBUR SMALL (A) 04/17/2020 0450   BILIRUBINUR NEGATIVE 04/17/2020 0450   BILIRUBINUR Negative 10/15/2019 1112   KETONESUR NEGATIVE 04/17/2020 0450   PROTEINUR 30 (A) 04/17/2020 0450   UROBILINOGEN 0.2 06/24/2015 0919   NITRITE NEGATIVE 04/17/2020 0450   LEUKOCYTESUR LARGE (A) 04/17/2020 0450   Sepsis Labs Invalid input(s): PROCALCITONIN,  WBC,  LACTICIDVEN Microbiology Recent Results (from the past 240 hour(s))  Urine culture     Status: Abnormal   Collection Time: 04/17/20  4:50 AM   Specimen: Urine, Random  Result Value Ref Range Status   Specimen Description   Final    URINE, RANDOM Performed at [facility], [address], [place], [place] [address]    Special Requests   Final    NONE Performed at [facility], [address] [place], [place] [address]    Culture MULTIPLE SPECIES PRESENT, SUGGEST RECOLLECTION (A)  Final   Report Status 04/18/2020 FINAL  Final  SARS CORONAVIRUS 2 (TAT 6-24 HRS) Nasopharyngeal Nasopharyngeal Swab     Status: None   Collection Time: 04/17/20  5:52 AM   Specimen: Nasopharyngeal Swab  Result Value Ref Range Status   SARS Coronavirus 2 NEGATIVE NEGATIVE Final    Comment: (NOTE) SARS-CoV-2 target nucleic acids are NOT DETECTED.  The SARS-CoV-2 RNA is generally detectable in upper and lower respiratory specimens during the acute phase of infection. Negative results do not preclude SARS-CoV-2 infection, do not rule out co-infections with other pathogens, and should not be used as the sole basis for treatment or other patient management decisions. Negative results must be combined with clinical observations, patient history, and epidemiological information. The expected result is Negative.  Fact Sheet for  Patients: SugarRoll.be  Fact Sheet for Healthcare Providers: https://www.woods-mathews.com/  This test is not yet approved or cleared by the [place] FDA and  has been authorized for detection and/or diagnosis of SARS-CoV-2 by FDA under an Emergency Use Authorization (EUA). This EUA will remain  in effect (meaning this test can be used) for the duration of the COVID-19 declaration under Se ction 564(b)(1) of the Act, 21 U.S.C. section 360bbb-3(b)(1), unless the authorization is terminated or revoked sooner.  Performed at [facility], [place] [address] [place], [place] [address]   Blood culture (routine x 2)     Status: None (Preliminary result)   Collection Time: 04/17/20  6:31 AM   Specimen: Right Antecubital; Blood  Result Value Ref Range Status   Specimen Description RIGHT ANTECUBITAL  Final   Special Requests   Final    BOTTLES DRAWN AEROBIC AND ANAEROBIC Blood Culture adequate volume   Culture   Final    NO GROWTH 1 DAY Performed at [facility], [address] [place], [place] [address]    Report Status PENDING  Incomplete  Blood culture (routine x 2)     Status: None (Preliminary result)   Collection Time: 04/17/20  6:31 AM   Specimen: Left Antecubital; Blood  Result Value Ref Range Status   Specimen Description LEFT ANTECUBITAL  Final   Special Requests   Final    BOTTLES DRAWN AEROBIC AND ANAEROBIC Blood Culture adequate volume   Culture   Final    NO GROWTH 1 DAY Performed at [facility], [address], [place], [place] [address]    Report Status PENDING  Incomplete     Total time spend on discharging this patient, including the last patient exam, discussing the hospital stay, instructions for ongoing care as it relates to all pertinent caregivers, as well as preparing the medical discharge records, prescriptions, and/or referrals as applicable, is 45 minutes.    Enzo Bi, MD  [facility] 04/18/2020, 10:54 AM

## 2020-04-18 NOTE — TOC Transition Note (Signed)
Transition of Care [facility]) - CM/SW Discharge Note   Patient Details  Name: Tammy [name] MRN: [id number] Date of Birth: 10/29/1945  [facility] CM/SW Contact:  Boris Sharper, LCSW Phone Number: 04/18/2020, 11:36 AM   Clinical Narrative:    Pt medically stable for discharge per MD. Pt will be transported home by her spouse. CSW was made aware that pt would need HH PT and had no preference in agencies. CSW arranged HH PT with Malachy Mood at [facility].   Final next level of care: Home w Home Health Services Barriers to Discharge: No Barriers Identified   Patient Goals and CMS Choice   CMS Medicare.gov Compare Post Acute Care list provided to:: Patient Choice offered to / list presented to : Patient  Discharge Placement                  Name of family member notified: [place] Patient and family notified of of transfer: 04/18/20  Discharge Plan and Services                          HH Arranged: PT [facility] Agency: [place] Date [place]: 04/18/20 Time [place]: 7001 Representative spoke with at [place]: [place] ([place]) Interventions     Readmission Risk Interventions No flowsheet data found.

## 2020-04-18 NOTE — Discharge Instructions (Signed)

## 2020-04-19 ENCOUNTER — Telehealth: Payer: Medicare Other | Admitting: Nurse Practitioner

## 2020-04-19 NOTE — Telephone Encounter (Signed)
Tammy [name], please schedule ct scan in 3 months.

## 2020-04-19 NOTE — Telephone Encounter (Signed)
CT has already been scheduled. I will call pt today to notify of appts.

## 2020-04-19 NOTE — Addendum Note (Signed)
Addended by: Telford Nab on: 04/19/2020 04:09 PM   Modules accepted: Orders

## 2020-04-19 NOTE — Telephone Encounter (Signed)
Pt made aware of appts for follow up CT scan and visit with Dr. Jacinto Reap. Referral to pulmonology placed.  During phone call pt stated that she is having pain when she takes a deep breath and is asking if Dr. B has any recommendations. Pt recently was admitted in the hospital for UTI and chest xray performed during admission.

## 2020-04-20 ENCOUNTER — Other Ambulatory Visit: Payer: Self-pay

## 2020-04-20 ENCOUNTER — Inpatient Hospital Stay (HOSPITAL_BASED_OUTPATIENT_CLINIC_OR_DEPARTMENT_OTHER): Payer: Medicare Other | Admitting: Nurse Practitioner

## 2020-04-20 DIAGNOSIS — R0781 Pleurodynia: Secondary | ICD-10-CM

## 2020-04-20 NOTE — Progress Notes (Signed)
Virtual Visit Progress Note  [facility]  [phone] [phone]  I connected with Tammy [name] on 04/20/20 at  2:15 PM EST by video enabled telemedicine visit and verified that I am speaking with the correct person using two identifiers.   I discussed the limitations, risks, security and privacy concerns of performing an evaluation and management service by telemedicine and the availability of in-person appointments. I also discussed with the patient that there may be a patient responsible charge related to this service. The patient expressed understanding and agreed to proceed.   Other persons participating in the visit and their role in the encounter: none  Patient's location: home Provider's location: clinic  Chief Complaint: pain when taking deep breath    Patient Care Team: [name], DO as PCP - General (Family Medicine) [name], [name], MD as PCP - Cardiology (Cardiology) [name] [name], MD as Consulting Physician (Cardiology) [name], MD as Consulting Physician (Urology) [name], MD as Consulting Physician (Nephrology) [name], MD as Referring Physician (Orthopedic Surgery) Ronnald Collum, Lourdes Sledge, MD as Attending Physician (Endocrinology) [name], MD as Referring Physician (Vascular Surgery) [name], [facility] as Pharmacist (Pharmacist) [name], RN as Case Manager (General Practice)   Name of the patient: Tammy [name]  [id number]  1945-07-03   Date of visit: 04/20/20  Diagnosis- Breast cancer  Chief complaint/ Reason for visit- pleuritic pain  Heme/Onc history:  Oncology History Overview Note   # July 2020 Left Breast- IMC; 14 mm; Grade: 2; Lymphovascular Invasion: Not identified;  pT1c pN0(sn); ER positive, PR positive, HER2 negative. [s/p lumpec; Dr.Byrnett]; rs/p RT [finished Sep, 9th 2020]  # Sep end 2020- START Arimidexa  # JAN 2022- 20m/left upper lobe  lung nodule; PET positive; right upper lobe pneumonia/suspected reactive adenopathy.   # CKD-III/COPD/ CHF-compesated/PAD; CAD  DIAGNOSIS: Left breast cancer  STAGE: 1  ;GOALS: Cure  CURRENT/MOST RECENT THERAPY: arimidex   Malignant neoplasm of lower-inner quadrant of left female breast ([place]  08/14/2018 Initial Diagnosis   Malignant neoplasm of lower-inner quadrant of left female breast ([facility]   Carcinoma of lower-inner quadrant of left breast in female, estrogen receptor positive ([place]  10/02/2018 Initial Diagnosis   Carcinoma of lower-inner quadrant of left breast in female, estrogen receptor positive ([place]     Interval history-  Came home Sunday. Right hand side of back radiating to shoulder. Thought she was having a heart attack and took nitroglycerin which didn't help. Took tylenol which did help. Has gradually improved each day. Today, breathing well. Bed caused her back to hurt and felt weak from not walking around. Now, feeling more normal. Eating and drinking well. No dizziness or weakness. Physical therapy starts tomorrow. Sleeping well. No nausea, vomiting, constipation, or diarrhea.   Review of systems- Review of Systems  Constitutional: Positive for malaise/fatigue. Negative for chills, fever and weight loss.  HENT: Negative for hearing loss, nosebleeds, sore throat and tinnitus.   Eyes: Negative for blurred vision and double vision.  Respiratory: Negative for cough, hemoptysis, shortness of breath and wheezing.   Cardiovascular: Negative for chest pain, palpitations and leg swelling.  Gastrointestinal: Negative for abdominal pain, blood in stool, constipation, diarrhea, melena, nausea and vomiting.  Genitourinary: Negative for dysuria and urgency.  Musculoskeletal: Negative for back pain, falls, joint pain and myalgias.  Skin: Negative for itching and rash.  Neurological: Positive for weakness. Negative for dizziness, tingling, sensory change, loss of  consciousness and headaches.  Endo/Heme/Allergies: Negative for environmental allergies. Does not bruise/bleed easily.  Psychiatric/Behavioral: Negative for depression. The patient is not nervous/anxious and does not have insomnia.      Allergies  Allergen Reactions  . Hydrocodone Other (See Comments)    Unresponsive  . Aleve [Naproxen Sodium]     Hives & itching   . Contrast Media [Iodinated Diagnostic Agents]     Pt avoids due to kidney issues  . Ioxaglate Other (See Comments)    (iodine) Pt avoids due to kidney issues   . Oxycodone Other (See Comments)    hallucinations  . Ranexa [Ranolazine]     Sick on stomach  . Sulfa Antibiotics Other (See Comments)    Pt avoids due to kidney issues  . Tramadol Other (See Comments)    nightmares    Past Medical History:  Diagnosis Date  . Asthma   . Benign neoplasm of colon   . Breast cancer ([place]) 08/2018   left breast  . Cervical disc disease    "bulging" - no limitations per pt  . Chronic diastolic CHF (congestive heart failure) ([place])    a. 07/2012 Echo: Nl EF. mild inferoseptal HK, Gr 2 DD, mild conc LVH, mild PR/TR, mild PAH.  . CKD (chronic kidney disease), stage III ([place])   . COPD (chronic obstructive pulmonary disease) ([place])   . Coronary artery disease    a. 11/2011 Cath: LAD 7md, LCX min irregs, RCA 70p, 1044mith L->R collats, EF 60%-->Med Rx.  . Diabetes mellitus without complication ([place]  . Fusion of lumbar spine 12/22/2015  . GERD (gastroesophageal reflux disease)   . History of DVT (deep vein thrombosis)   . History of tobacco abuse    a. Quit 2011.  . Marland Kitchenyperlipidemia   . Hypertension   . Hypothyroidism   . Obesity   . Palpitations   . PE (pulmonary embolism) 10/15  . Personal history of radiation therapy   . PONV (postoperative nausea and vomiting)   . Pulmonary embolus ([place]   RLL. Presented with SOB and elevated d-dimer.   . Marland KitchenVD (peripheral vascular disease) ([facility]   a. PTA of right leg and  left femoral artery with stenosis.  . Renal insufficiency   . [facility]   TIAs - no deficits    Past Surgical History:  Procedure Laterality Date  . ANGIOPLASTY / STENTING FEMORAL     PVD; angioplasty right leg and left femoral artery with stenosis.   . APPENDECTOMY  Oct. 2016  . BACK SURGERY  03/2012   nerve stimulator inserted  . BACK SURGERY     screws, rods replaced with new hardware.  . Marland KitchenACK SURGERY  11/2016  . BREAST LUMPECTOMY Left 08/26/2018  . BREAST SURGERY    . CARDIAC CATHETERIZATION  12/07/2011   Mid LAD 50%, distal LAD 50%, mid RCA 70%, distal RCA 100%   . CARDIAC CATHETERIZATION  01/04/2011   100% occluded mid RCA with good collaterals from distal LAD, normal LVEF.  . Marland KitchenHOLECYSTECTOMY    . COLONOSCOPY  2013  . COLONOSCOPY WITH PROPOFOL N/A 05/24/2015   Procedure: COLONOSCOPY WITH PROPOFOL;  Surgeon: DaLucilla LameMD;  [place] [facility] Service: Endoscopy;  Laterality: N/A;  Diabetic - oral meds  . COLONOSCOPY WITH PROPOFOL N/A 01/28/2019   Procedure: COLONOSCOPY WITH PROPOFOL;  Surgeon: TaVirgel ManifoldMD;  Location: [facility] ENDOSCOPY;  Service: Endoscopy;  Laterality: N/A;  . DIAGNOSTIC LAPAROSCOPY    .  ESOPHAGOGASTRODUODENOSCOPY (EGD) WITH PROPOFOL N/A 01/28/2019   Procedure: ESOPHAGOGASTRODUODENOSCOPY (EGD) WITH PROPOFOL;  Surgeon: [name], MD;  Location: [facility] ENDOSCOPY;  Service: Endoscopy;  Laterality: N/A;  . LAPAROSCOPIC APPENDECTOMY N/A 01/06/2015   Procedure: APPENDECTOMY LAPAROSCOPIC;  Surgeon: [name], MD;  Location: [facility] ORS;  Service: General;  Laterality: N/A;  . LEFT HEART CATH AND CORONARY ANGIOGRAPHY Left 04/04/2019   Procedure: LEFT HEART CATH AND CORONARY ANGIOGRAPHY;  Surgeon: [name], MD;  Location: [place] [facility] LAB;  Service: Cardiovascular;  Laterality: Left;  [name] MASTECTOMY    . MASTECTOMY W/ SENTINEL NODE BIOPSY Left 08/26/2018   Procedure: PARTIAL MASTECTOMY WIDE EXCISION WITH  SENTINEL LYMPH NODE BIOPSY LEFT;  Surgeon: [name], MD;  Location: [facility] ORS;  Service: General;  Laterality: Left;  . POLYPECTOMY  05/24/2015   Procedure: POLYPECTOMY;  Surgeon: [name], MD;  Location: [place];  Service: Endoscopy;;  . THYROIDECTOMY    . TOTAL KNEE ARTHROPLASTY Right 11/13/2019  . TRIGGER FINGER RELEASE      Social History   Socioeconomic History  . Marital status: Married    Spouse name: Not on file  . Number of children: 2  . Years of education: some college, Production assistant, radio   . Highest education level: Associate degree: occupational, Hotel manager, or vocational program  Occupational History  . Occupation: Retired  Tobacco Use  . Smoking status: Former Smoker    Packs/day: 0.50    Years: 15.00    Pack years: 7.50    Types: Cigarettes    Quit date: 03/14/2007    Years since quitting: 13.1  . Smokeless tobacco: Never Used  Vaping Use  . Vaping Use: Never used  Substance and Sexual Activity  . Alcohol use: No    Alcohol/week: 0.0 standard drinks  . Drug use: Never  . Sexual activity: Yes    Birth control/protection: Post-menopausal  Other Topics Concern  . Not on file  Social History Narrative   Lives at home with husband in [place]. Quit smoking 10 years ago; never alcohol. 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    Social Determinants of Health   Financial Resource Strain: Low Risk   . Difficulty of Paying Living Expenses: Not hard at all  Food Insecurity: No Food Insecurity  . Worried About Charity fundraiser in the Last Year: Never true  . Ran Out of Food in the Last Year: Never true  Transportation Needs: No Transportation Needs  . Lack of Transportation (Medical): No  . Lack of Transportation (Non-Medical): No  Physical Activity: Sufficiently Active  . Days of Exercise per Week: 7 days  . Minutes of Exercise per Session: 60 min  Stress: No Stress Concern  Present  . Feeling of Stress : Not at all  Social Connections: Socially Integrated  . Frequency of Communication with Friends and Family: More than three times a week  . Frequency of Social Gatherings with Friends and Family: More than three times a week  . Attends Religious Services: More than 4 times per year  . Active Member of Clubs or Organizations: Yes  . Attends Archivist Meetings: 1 to 4 times per year  . Marital Status: Married  Human resources officer Violence: Unknown  . Fear of Current or Ex-Partner: No  . Emotionally Abused: No  . Physically Abused: No  . Sexually Abused: Not on file    Family History  Problem Relation Age of Onset  . Hypertension Father   . Heart disease Father   . Heart attack Father   . Diabetes Father   . Cancer Mother        colon  . Heart attack Brother   . Diabetes Brother   . Hypertension Brother   . Heart disease Brother   . Heart attack Brother   . Diabetes Brother   . Hypertension Brother   . Heart disease Brother   . Heart attack Brother   . Diabetes Brother   . Hypertension Brother   . Heart disease Brother   . Cancer Sister        lung  . COPD Sister   . COPD Sister   . Breast cancer Neg Hx      Current Outpatient Medications:  .  anastrozole (ARIMIDEX) 1 MG tablet, Take 1 tablet (1 mg total) by mouth daily., Disp: 30 tablet, Rfl: 4 .  blood glucose meter kit and supplies KIT, Dispense based on patient and insurance preference. Use one time daily as directed, E11.9, Disp: 1 each, Rfl: 0 .  Calcium Carb-Cholecalciferol (CALCIUM 600 + D) 600-200 MG-UNIT TABS, Take 1 tablet by mouth daily. , Disp: , Rfl:  .  cetirizine (ZYRTEC) 10 MG tablet, Take 1 tablet (10 mg total) by mouth daily., Disp: 30 tablet, Rfl: 11 .  cholecalciferol (VITAMIN D3) 25 MCG (1000 UT) tablet, Take 1,000 Units by mouth daily., Disp: , Rfl:  .  Dulaglutide (TRULICITY) 3 FM/3.8GY SOPN, Inject 0.5 mLs (3 mg total) into the skin once a week. (Patient  taking differently: Inject 3 mg into the skin every Tuesday.), Disp: 2 mL, Rfl: 6 .  DULoxetine (CYMBALTA) 60 MG capsule, TAKE 1 CAPSULE BY MOUTH DAILY (Patient taking differently: Take 60 mg by mouth daily.), Disp: 90 capsule, Rfl: 1 .  ELIQUIS 2.5 MG TABS tablet, TAKE 1 TABLET BY MOUTH TWICE DAILY (Patient taking differently: Take 2.5 mg by mouth 2 (two) times daily.), Disp: 180 tablet, Rfl: 1 .  gabapentin (NEURONTIN) 300 MG capsule, Take 1 capsule (300 mg total) by mouth 3 (three) times daily., Disp: 270 capsule, Rfl: 3 .  glipiZIDE (GLUCOTROL) 5 MG tablet, TAKE ONE TABLET BY MOUTH EVERY DAY BEFORE BREAKFAST (Patient taking differently: Take 5 mg by mouth daily before breakfast.), Disp: 90 tablet, Rfl: 1 .  insulin degludec (TRESIBA FLEXTOUCH) 100 UNIT/ML FlexTouch Pen, Inject 0.24 mLs (24 Units total) into the skin daily. (Patient taking differently: Inject 24 Units into the skin at bedtime.), Disp: 12 mL, Rfl: 1 .  Insulin Pen Needle (PEN NEEDLES 3/16") 31G X 5 MM MISC, 1 each by Does not apply route daily., Disp: 100 each, Rfl: 12 .  isosorbide mononitrate (IMDUR) 60 MG 24 hr tablet, TAKE ONE TABLET BY MOUTH AT BEDTIME AND HALF A TABLET EVERY MORNING (Patient taking differently: Take 30-60 mg by mouth See admin instructions. Take  tablet (68m) by mouth every morning and take 1 tablet (653m by mouth every night), Disp: 135 tablet, Rfl: 3 .  JARDIANCE 25 MG TABS tablet, TAKE ONE TABLET 25MG EVERY DAY BEFORE BREAKFAST (Patient taking differently: Take 25 mg by mouth daily before breakfast.), Disp: 90 tablet, Rfl: 1 .  Meth-Hyo-M Bl-Na Phos-Ph Sal (URO-MP) 118 MG CAPS, TAKE 1 CAPSULE BY MOUTH 3 TIMES DAILY ASNEEDED ( URINARY FREQUENCY,URGENCY, BURNING. (Patient taking differently: Take 1 capsule by mouth 3 (three) times daily as needed (urinary frequency, urgency or burning).), Disp: 90 capsule, Rfl: 1 .  metoprolol succinate (TOPROL-XL) 50 MG 24 hr tablet, TAKE ONE TABLET EVERY DAY WITH OR  IMMEDIATELY FOLLOWING A MEAL (Patient taking differently: Take 50 mg by mouth daily.), Disp: 90 tablet, Rfl: 1 .  mometasone (ELOCON) 0.1 % lotion, Apply topically as directed. Qd up to 5 days a week to aa scalp until clear, then prn flares (Patient taking differently: Apply 1 application topically daily as needed (scalp irritation).), Disp: 60 mL, Rfl: 1 .  montelukast (SINGULAIR) 10 MG tablet, Take 1 tablet (10 mg total) by mouth at bedtime., Disp: 90 tablet, Rfl: 1 .  MYRBETRIQ 50 MG TB24 tablet, TAKE ONE TABLET EVERY DAY (Patient taking differently: Take 50 mg by mouth daily.), Disp: 30 tablet, Rfl: 2 .  nitroGLYCERIN (NITROSTAT) 0.4 MG SL tablet, Place 1 tablet (0.4 mg total) under the tongue every 5 (five) minutes as needed for chest pain., Disp: 25 tablet, Rfl: 6 .  pantoprazole (PROTONIX) 40 MG tablet, Take 1 tablet (40 mg total) by mouth daily as needed. (Patient taking differently: Take 40 mg by mouth daily.), Disp: 90 tablet, Rfl: 1 .  Polyethylene Glycol 400 (BLINK TEARS) 0.25 % SOLN, Place 1 drop into both eyes 3 (three) times daily as needed (dry/irritated eyes.). , Disp: , Rfl:  .  PROAIR HFA 108 (90 Base) MCG/ACT inhaler, TAKE 2 PUFFS EVERY 4 HOURS AS NEEDED FORWHEEZING OR SHORTNESS OF BREATH, Disp: 8.5 g, Rfl: 3 .  rosuvastatin (CRESTOR) 40 MG tablet, TAKE 1 TABLET BY MOUTH ONCE DAILY (Patient taking differently: Take 40 mg by mouth daily.), Disp: 90 tablet, Rfl: 1 .  senna (SENOKOT) 8.6 MG TABS tablet, Take 8.6 mg by mouth daily as needed for mild constipation., Disp: , Rfl:  .  SYNTHROID 150 MCG tablet, TAKE 1 TABLET EVERY DAY ON EMPTY STOMACHWITH A GLASS OF WATER AT LEAST 30-60 MINBEFORE BREAKFAST (Patient taking differently: Take 150 mcg by mouth daily before breakfast.), Disp: 30 tablet, Rfl: 2 .  trimethoprim (TRIMPEX) 100 MG tablet, TAKE ONE TABLET BY MOUTH EVERY DAY (Patient taking differently: Take 100 mg by mouth daily.), Disp: 90 tablet, Rfl: 1  Physical exam: Exam limited  due to telemedicine  There were no vitals filed for this visit. Physical Exam Constitutional:      General: She is not in acute distress. HENT:     Head: Normocephalic.  Pulmonary:     Effort: Pulmonary effort is normal. No respiratory distress.     Comments: Speaking in full sentences and good volume. No coughing or wheezing heard.  Neurological:     Mental Status: She is alert and oriented to person, place, and time.  Psychiatric:        Mood and Affect: Mood normal.        Behavior: Behavior normal.      CMP Latest Ref Rng & Units 04/18/2020  Glucose 70 - 99 mg/dL 127(H)  BUN 8 - 23 mg/dL 16  Creatinine 0.44 - 1.00 mg/dL 1.57(H)  Sodium 135 - 145 mmol/L 133(L)  Potassium 3.5 - 5.1 mmol/L 4.3  Chloride 98 - 111 mmol/L 104  CO2 22 - 32 mmol/L 20(L)  Calcium 8.9 - 10.3 mg/dL 8.1(L)  Total Protein 6.5 - 8.1 g/dL -  Total Bilirubin 0.3 - 1.2 mg/dL -  Alkaline Phos 38 - 126 U/L -  AST 15 - 41 U/L -  ALT 0 - 44 U/L -   CBC Latest Ref Rng & Units 04/18/2020  WBC 4.0 - 10.5 K/uL 14.2(H)  Hemoglobin 12.0 -  15.0 g/dL 11.8(L)  Hematocrit 36.0 - 46.0 % 38.0  Platelets 150 - 400 K/uL 224    No images are attached to the encounter.  CT Chest Wo Contrast  Result Date: 03/23/2020 CLINICAL DATA:  Followup pulmonary nodules. EXAM: CT CHEST WITHOUT CONTRAST TECHNIQUE: Multidetector CT imaging of the chest was performed following the standard protocol without IV contrast. COMPARISON:  CT scans 07/17/2019 and 03/11/2018 FINDINGS: Cardiovascular: The heart is normal in size. No pericardial effusion. Stable tortuosity, ectasia and calcification of the thoracic aorta. No focal aneurysm. Maximum diameter of the descending thoracic aorta is 3.4 cm. Mediastinum/Nodes: Stable scattered mediastinal and hilar lymph nodes but no mass or overt adenopathy. The esophagus is grossly normal. Lungs/Pleura: Slight interval enlargement of the peripheral left upper lobe pulmonary nodule it measures 8 x 7 mm on  image number 55 of series 3. Previously measured 6 x 6 mm when remeasured in the same planes. The smaller more posterior subpleural nodule in the left upper lobe on image number 52/3 is unchanged at 3 mm. Stable emphysematous changes and areas of pulmonary scarring. No new pulmonary nodules. No acute pulmonary findings. Stable basilar scarring changes. No pleural effusions or pleural nodules. Upper Abdomen: No significant upper abdominal findings. Stable aortic calcifications and mild ectasia. No upper abdominal adenopathy. Musculoskeletal: No significant bony findings. Spinal cord stimulator noted the midthoracic area. IMPRESSION: 1. Slight interval enlargement of the peripheral left upper lobe pulmonary nodule. At this point options would include PET-CT or continued short-term follow-up chest CT in 3-4 months. 2. 3 mm subpleural left upper lobe pulmonary nodule is stable. 3. Stable emphysematous changes and pulmonary scarring. No acute pulmonary findings. 4. Stable scattered mediastinal and hilar lymph nodes but no mass or adenopathy. 5. Stable atherosclerotic calcifications involving the thoracic and abdominal aorta. 6. Emphysema and aortic atherosclerosis. Aortic Atherosclerosis (ICD10-I70.0) and Emphysema (ICD10-J43.9). Electronically Signed   By: Marijo Sanes M.D.   On: 03/23/2020 15:39   NM PET Image Initial (PI) Skull Base To Thigh  Result Date: 04/09/2020 CLINICAL DATA:  Initial treatment strategy for pulmonary nodule. EXAM: NUCLEAR MEDICINE PET SKULL BASE TO THIGH TECHNIQUE: 11.4 mCi F-18 FDG was injected intravenously. Full-ring PET imaging was performed from the skull base to thigh after the radiotracer. CT data was obtained and used for attenuation correction and anatomic localization. Fasting blood glucose: 75 mg/dl COMPARISON:  Chest CT 03/23/2020, 07/17/2019 and 03/11/2018 FINDINGS: Mediastinal blood pool activity: SUV max 3.7 Liver activity: SUV max NA NECK: No hypermetabolic lymph nodes in the  neck. Incidental CT findings: none CHEST: Nodule of concern in the lateral aspect of the LEFT upper lobe along the pleural surface measures 8 mm (image 70) and has mild metabolic activity SUV max equal 2.4. This nodule has enlarged over time when compared to CT 03/11/2018 (3 mm) as described on recent diagnostic CT. There is new focus of segmental airspace disease in the lateral aspect of the RIGHT upper lobe with intense metabolic activity (SUV max equal 9.2. (Image 82). Moderate metabolic activity associated 11 mm RIGHT lower paratracheal lymph node with SUV max equal 4.2. This node is not changed in size from CT 2019. additionally high metabolic activity associated with a RIGHT hilar lymph node with SUV max equal 5.9. In the deep within the deep tissue of the LEFT breast there is angular thickening with metabolic activity (SUV max equal 3.8 on image 78. This Incidental CT findings: none ABDOMEN/PELVIS: No abnormal hypermetabolic activity within the liver, pancreas, adrenal glands, or  spleen. No hypermetabolic lymph nodes in the abdomen or pelvis. Incidental CT findings: Atherosclerotic calcification of the aorta. Spinal cord stimulator device noted. Posterior lumbar fusion. Severe cortical thinning of the RIGHT kidney. SKELETON: Symmetric activity in the paraspinal musculature at T11 is favored benign. No evidence skeletal metastasis. Incidental CT findings: none IMPRESSION: 1. Mild metabolic activity associated with enlarging LEFT upper lobe subpleural pulmonary nodule. Combination of findings is concerning for bronchial neoplasm. Consider tissue sampling or resection. 2. New hypermetabolic airspace disease in the RIGHT upper lobe consistent with pneumonia. 3. Hypermetabolic RIGHT paratracheal and RIGHT hilar lymph nodes. Favor reactive adenopathy in light of the pneumonia in the RIGHT upper lobe. 4. Mild hypermetabolic tissue in the deep LEFT breast. Recommend correlation with recent diagnostic mammograms.  Findings conveyed toJENNIFER BURNS on 04/09/2020  at9:00. Electronically Signed   By: Suzy Bouchard M.D.   On: 04/09/2020 10:31   DG Chest Port 1 View  Result Date: 04/17/2020 CLINICAL DATA:  Questionable sepsis EXAM: PORTABLE CHEST 1 VIEW COMPARISON:  07/17/2019 FINDINGS: Normal heart size when accounting for mediastinal fat pad. Aortic tortuosity. Hyperinflation. There is no edema, consolidation, effusion, or pneumothorax. Dorsal column stimulator leads. IMPRESSION: COPD without acute superimposed finding. Electronically Signed   By: Monte Fantasia M.D.   On: 04/17/2020 04:30    Assessment and plan- Patient is a 75 y.o. female who [facility] post hospital discharge for pleuritic chest pain:   1. Pleuritic pain- likely related to recent pneumonia/infection. COPD on chest xray. Patient has been referred to pulmonology by Dr. Rogue Bussing. Symptoms improving. Recommend scheduled tylenol, 1 tablet (350-500 mg) every 6-8 hours for the next 48 hours. Physical therapy as scheduled outpatient for weakness and debility. Reviewed use of albuterol inhaler; denies needing refill. Follow up with pulmonology for further evaluation and management of COPD.   Notify clinic if symptoms do not continue to improve.    Visit Diagnosis 1. Pleuritic chest pain     Patient expressed understanding and was in agreement with this plan. She also understands that She can call clinic at any time with any questions, concerns, or complaints.   I discussed the assessment and treatment plan with the patient. The patient was provided an opportunity to ask questions and all were answered. The patient agreed with the plan and demonstrated an understanding of the instructions.   The patient was advised to call back or seek an in-person evaluation if the symptoms worsen or if the condition fails to improve as anticipated.   I spent 15 minutes face-to-face video visit time dedicated to the care of this  patient on the date of this encounter to include pre-visit review of hospital notes, imaging and labs, face-to-face time with the patient, and post visit ordering of testing/documentation.   Thank you for allowing me to participate in the care of this very pleasant patient.   Beckey Rutter, DNP, AGNP-[facility]: Dr. Rogue Bussing

## 2020-04-20 NOTE — Telephone Encounter (Signed)
Tammy [name]- please check if she wants to do [facility] virtual visit for further evaluation. If she delcines- Most likley its pleurisy from recent pneumonia- recommend tylenol 1 pills every 6-8 hours for 2-3 days.  Thanks GB

## 2020-04-20 NOTE — Telephone Encounter (Signed)
Mychart visit arranged today for 215 pm. Patient is aware of apt time and agreeable to plan of care. Patient has been taking Tylenol as directed and has noted improvement in symptoms over the last 24 hours.

## 2020-04-21 DIAGNOSIS — Z794 Long term (current) use of insulin: Secondary | ICD-10-CM | POA: Diagnosis not present

## 2020-04-21 DIAGNOSIS — E039 Hypothyroidism, unspecified: Secondary | ICD-10-CM | POA: Diagnosis not present

## 2020-04-21 DIAGNOSIS — Z981 Arthrodesis status: Secondary | ICD-10-CM | POA: Diagnosis not present

## 2020-04-21 DIAGNOSIS — E669 Obesity, unspecified: Secondary | ICD-10-CM | POA: Diagnosis not present

## 2020-04-21 DIAGNOSIS — J449 Chronic obstructive pulmonary disease, unspecified: Secondary | ICD-10-CM | POA: Diagnosis not present

## 2020-04-21 DIAGNOSIS — Z9181 History of falling: Secondary | ICD-10-CM | POA: Diagnosis not present

## 2020-04-21 DIAGNOSIS — E785 Hyperlipidemia, unspecified: Secondary | ICD-10-CM | POA: Diagnosis not present

## 2020-04-21 DIAGNOSIS — I5032 Chronic diastolic (congestive) heart failure: Secondary | ICD-10-CM | POA: Diagnosis not present

## 2020-04-21 DIAGNOSIS — I130 Hypertensive heart and chronic kidney disease with heart failure and stage 1 through stage 4 chronic kidney disease, or unspecified chronic kidney disease: Secondary | ICD-10-CM | POA: Diagnosis not present

## 2020-04-21 DIAGNOSIS — Z7984 Long term (current) use of oral hypoglycemic drugs: Secondary | ICD-10-CM | POA: Diagnosis not present

## 2020-04-21 DIAGNOSIS — I2510 Atherosclerotic heart disease of native coronary artery without angina pectoris: Secondary | ICD-10-CM | POA: Diagnosis not present

## 2020-04-21 DIAGNOSIS — C50312 Malignant neoplasm of lower-inner quadrant of left female breast: Secondary | ICD-10-CM | POA: Diagnosis not present

## 2020-04-21 DIAGNOSIS — N390 Urinary tract infection, site not specified: Secondary | ICD-10-CM | POA: Diagnosis not present

## 2020-04-21 DIAGNOSIS — E1122 Type 2 diabetes mellitus with diabetic chronic kidney disease: Secondary | ICD-10-CM | POA: Diagnosis not present

## 2020-04-21 DIAGNOSIS — N183 Chronic kidney disease, stage 3 unspecified: Secondary | ICD-10-CM | POA: Diagnosis not present

## 2020-04-21 DIAGNOSIS — Z87891 Personal history of nicotine dependence: Secondary | ICD-10-CM | POA: Diagnosis not present

## 2020-04-21 DIAGNOSIS — Z79899 Other long term (current) drug therapy: Secondary | ICD-10-CM | POA: Diagnosis not present

## 2020-04-21 DIAGNOSIS — E1151 Type 2 diabetes mellitus with diabetic peripheral angiopathy without gangrene: Secondary | ICD-10-CM | POA: Diagnosis not present

## 2020-04-21 DIAGNOSIS — Z6837 Body mass index (BMI) 37.0-37.9, adult: Secondary | ICD-10-CM | POA: Diagnosis not present

## 2020-04-22 LAB — CULTURE, BLOOD (ROUTINE X 2)
Culture: NO GROWTH
Culture: NO GROWTH
Special Requests: ADEQUATE
Special Requests: ADEQUATE

## 2020-04-27 DIAGNOSIS — I130 Hypertensive heart and chronic kidney disease with heart failure and stage 1 through stage 4 chronic kidney disease, or unspecified chronic kidney disease: Secondary | ICD-10-CM | POA: Diagnosis not present

## 2020-04-27 DIAGNOSIS — E1122 Type 2 diabetes mellitus with diabetic chronic kidney disease: Secondary | ICD-10-CM | POA: Diagnosis not present

## 2020-04-27 DIAGNOSIS — E1151 Type 2 diabetes mellitus with diabetic peripheral angiopathy without gangrene: Secondary | ICD-10-CM | POA: Diagnosis not present

## 2020-04-27 DIAGNOSIS — N183 Chronic kidney disease, stage 3 unspecified: Secondary | ICD-10-CM | POA: Diagnosis not present

## 2020-04-27 DIAGNOSIS — I5032 Chronic diastolic (congestive) heart failure: Secondary | ICD-10-CM | POA: Diagnosis not present

## 2020-04-27 DIAGNOSIS — N390 Urinary tract infection, site not specified: Secondary | ICD-10-CM | POA: Diagnosis not present

## 2020-04-28 ENCOUNTER — Other Ambulatory Visit: Payer: Self-pay | Admitting: Physician Assistant

## 2020-04-28 DIAGNOSIS — N3281 Overactive bladder: Secondary | ICD-10-CM

## 2020-04-29 DIAGNOSIS — E1122 Type 2 diabetes mellitus with diabetic chronic kidney disease: Secondary | ICD-10-CM | POA: Diagnosis not present

## 2020-04-29 DIAGNOSIS — I5032 Chronic diastolic (congestive) heart failure: Secondary | ICD-10-CM | POA: Diagnosis not present

## 2020-04-29 DIAGNOSIS — E1151 Type 2 diabetes mellitus with diabetic peripheral angiopathy without gangrene: Secondary | ICD-10-CM | POA: Diagnosis not present

## 2020-04-29 DIAGNOSIS — N390 Urinary tract infection, site not specified: Secondary | ICD-10-CM | POA: Diagnosis not present

## 2020-04-29 DIAGNOSIS — N183 Chronic kidney disease, stage 3 unspecified: Secondary | ICD-10-CM | POA: Diagnosis not present

## 2020-04-29 DIAGNOSIS — I130 Hypertensive heart and chronic kidney disease with heart failure and stage 1 through stage 4 chronic kidney disease, or unspecified chronic kidney disease: Secondary | ICD-10-CM | POA: Diagnosis not present

## 2020-05-04 ENCOUNTER — Other Ambulatory Visit: Payer: Self-pay | Admitting: Family Medicine

## 2020-05-04 DIAGNOSIS — I130 Hypertensive heart and chronic kidney disease with heart failure and stage 1 through stage 4 chronic kidney disease, or unspecified chronic kidney disease: Secondary | ICD-10-CM | POA: Diagnosis not present

## 2020-05-04 DIAGNOSIS — N390 Urinary tract infection, site not specified: Secondary | ICD-10-CM | POA: Diagnosis not present

## 2020-05-04 DIAGNOSIS — I5032 Chronic diastolic (congestive) heart failure: Secondary | ICD-10-CM | POA: Diagnosis not present

## 2020-05-04 DIAGNOSIS — N183 Chronic kidney disease, stage 3 unspecified: Secondary | ICD-10-CM | POA: Diagnosis not present

## 2020-05-04 DIAGNOSIS — E1151 Type 2 diabetes mellitus with diabetic peripheral angiopathy without gangrene: Secondary | ICD-10-CM | POA: Diagnosis not present

## 2020-05-04 DIAGNOSIS — E1122 Type 2 diabetes mellitus with diabetic chronic kidney disease: Secondary | ICD-10-CM | POA: Diagnosis not present

## 2020-05-04 NOTE — Telephone Encounter (Signed)
Future visit in 1 week  

## 2020-05-06 DIAGNOSIS — N183 Chronic kidney disease, stage 3 unspecified: Secondary | ICD-10-CM | POA: Diagnosis not present

## 2020-05-06 DIAGNOSIS — E1122 Type 2 diabetes mellitus with diabetic chronic kidney disease: Secondary | ICD-10-CM | POA: Diagnosis not present

## 2020-05-06 DIAGNOSIS — I5032 Chronic diastolic (congestive) heart failure: Secondary | ICD-10-CM | POA: Diagnosis not present

## 2020-05-06 DIAGNOSIS — E1151 Type 2 diabetes mellitus with diabetic peripheral angiopathy without gangrene: Secondary | ICD-10-CM | POA: Diagnosis not present

## 2020-05-06 DIAGNOSIS — I130 Hypertensive heart and chronic kidney disease with heart failure and stage 1 through stage 4 chronic kidney disease, or unspecified chronic kidney disease: Secondary | ICD-10-CM | POA: Diagnosis not present

## 2020-05-06 DIAGNOSIS — N390 Urinary tract infection, site not specified: Secondary | ICD-10-CM | POA: Diagnosis not present

## 2020-05-10 DIAGNOSIS — N390 Urinary tract infection, site not specified: Secondary | ICD-10-CM | POA: Diagnosis not present

## 2020-05-10 DIAGNOSIS — E1151 Type 2 diabetes mellitus with diabetic peripheral angiopathy without gangrene: Secondary | ICD-10-CM | POA: Diagnosis not present

## 2020-05-10 DIAGNOSIS — N183 Chronic kidney disease, stage 3 unspecified: Secondary | ICD-10-CM | POA: Diagnosis not present

## 2020-05-10 DIAGNOSIS — I5032 Chronic diastolic (congestive) heart failure: Secondary | ICD-10-CM | POA: Diagnosis not present

## 2020-05-10 DIAGNOSIS — E1122 Type 2 diabetes mellitus with diabetic chronic kidney disease: Secondary | ICD-10-CM | POA: Diagnosis not present

## 2020-05-10 DIAGNOSIS — I130 Hypertensive heart and chronic kidney disease with heart failure and stage 1 through stage 4 chronic kidney disease, or unspecified chronic kidney disease: Secondary | ICD-10-CM | POA: Diagnosis not present

## 2020-05-11 ENCOUNTER — Ambulatory Visit (INDEPENDENT_AMBULATORY_CARE_PROVIDER_SITE_OTHER): Payer: Medicare Other | Admitting: Family Medicine

## 2020-05-11 ENCOUNTER — Encounter: Payer: Self-pay | Admitting: Family Medicine

## 2020-05-11 ENCOUNTER — Ambulatory Visit (INDEPENDENT_AMBULATORY_CARE_PROVIDER_SITE_OTHER): Payer: Medicare Other | Admitting: General Practice

## 2020-05-11 ENCOUNTER — Other Ambulatory Visit: Payer: Self-pay

## 2020-05-11 ENCOUNTER — Ambulatory Visit: Payer: Medicare Other | Admitting: General Practice

## 2020-05-11 VITALS — BP 116/78 | HR 87 | Temp 98.3°F | Wt 208.0 lb

## 2020-05-11 DIAGNOSIS — J189 Pneumonia, unspecified organism: Secondary | ICD-10-CM

## 2020-05-11 DIAGNOSIS — N183 Chronic kidney disease, stage 3 unspecified: Secondary | ICD-10-CM

## 2020-05-11 DIAGNOSIS — R109 Unspecified abdominal pain: Secondary | ICD-10-CM

## 2020-05-11 DIAGNOSIS — N39490 Overflow incontinence: Secondary | ICD-10-CM

## 2020-05-11 DIAGNOSIS — Z794 Long term (current) use of insulin: Secondary | ICD-10-CM | POA: Diagnosis not present

## 2020-05-11 DIAGNOSIS — I10 Essential (primary) hypertension: Secondary | ICD-10-CM | POA: Diagnosis not present

## 2020-05-11 DIAGNOSIS — R319 Hematuria, unspecified: Secondary | ICD-10-CM

## 2020-05-11 DIAGNOSIS — I129 Hypertensive chronic kidney disease with stage 1 through stage 4 chronic kidney disease, or unspecified chronic kidney disease: Secondary | ICD-10-CM

## 2020-05-11 DIAGNOSIS — E1122 Type 2 diabetes mellitus with diabetic chronic kidney disease: Secondary | ICD-10-CM | POA: Diagnosis not present

## 2020-05-11 DIAGNOSIS — M25561 Pain in right knee: Secondary | ICD-10-CM

## 2020-05-11 DIAGNOSIS — W19XXXA Unspecified fall, initial encounter: Secondary | ICD-10-CM

## 2020-05-11 DIAGNOSIS — R911 Solitary pulmonary nodule: Secondary | ICD-10-CM

## 2020-05-11 LAB — URINALYSIS, ROUTINE W REFLEX MICROSCOPIC
Bilirubin, UA: NEGATIVE
Ketones, UA: NEGATIVE
Leukocytes,UA: NEGATIVE
Nitrite, UA: NEGATIVE
Specific Gravity, UA: 1.015 (ref 1.005–1.030)
Urobilinogen, Ur: 0.2 mg/dL (ref 0.2–1.0)
pH, UA: 6 (ref 5.0–7.5)

## 2020-05-11 LAB — CBC WITH DIFFERENTIAL/PLATELET
Hematocrit: 43 % (ref 34.0–46.6)
Hemoglobin: 13.1 g/dL (ref 11.1–15.9)
Lymphocytes Absolute: 2.6 10*3/uL (ref 0.7–3.1)
Lymphs: 22 %
MCH: 27 pg (ref 26.6–33.0)
MCHC: 30.5 g/dL — ABNORMAL LOW (ref 31.5–35.7)
MCV: 89 fL (ref 79–97)
MID (Absolute): 1.8 10*3/uL — ABNORMAL HIGH (ref 0.1–1.6)
MID: 16 %
Neutrophils Absolute: 7 10*3/uL (ref 1.4–7.0)
Neutrophils: 62 %
Platelets: 352 10*3/uL (ref 150–450)
RBC: 4.86 x10E6/uL (ref 3.77–5.28)
RDW: 16.6 % — ABNORMAL HIGH (ref 11.7–15.4)
WBC: 11.4 10*3/uL — ABNORMAL HIGH (ref 3.4–10.8)

## 2020-05-11 LAB — MICROSCOPIC EXAMINATION

## 2020-05-11 LAB — BAYER DCA HB A1C WAIVED: HB A1C (BAYER DCA - WAIVED): 7.7 % — ABNORMAL HIGH (ref <7.0)

## 2020-05-11 NOTE — Progress Notes (Signed)
BP 116/78   Pulse 87   Temp 98.3 F (36.8 C)   Wt 208 lb (94.3 kg)   SpO2 94%   BMI 33.57 kg/m    Subjective:    Patient ID: Tammy [name], female    DOB: 1946/01/03, 75 y.o.   MRN: [id number]  HPI: Tammy [name] is a 75 y.o. female  Chief Complaint  Patient presents with  . Diabetes  . Hospitalization Follow-up    Patient states in February she fell and was sent to ED. Was treated for a UTI in hospital.   . Abdominal Pain    Patient states since yesterday she has been having lower abdominal pain. Patient states she did have some pain with urination. Patient states she is having bad gas and has pain.    DIABETES Hypoglycemic episodes:no Polydipsia/polyuria: yes Visual disturbance: no Chest pain: no Paresthesias: no Glucose Monitoring: no  Accucheck frequency: Not Checking Taking Insulin?: yes Blood Pressure Monitoring: not checking Retinal Examination: Up to Date Foot Exam: Not up to Date Diabetic Education: Completed Pneumovax: Up to Date Influenza: Up to Date  URINARY SYMPTOMS Duration: yesterday Dysuria: yes Urinary frequency: yes Urgency: yes Small volume voids: yes Symptom severity: moderate Urinary incontinence: yes Foul odor: yes Hematuria: no Abdominal pain: yes Back pain: yes Suprapubic pain/pressure: yes Flank pain: no Fever:  no Vomiting: no Relief with cranberry juice: no Relief with pyridium: no Status: stable Previous urinary tract infection: yes Recurrent urinary tract infection: no History of sexually transmitted disease: no Vaginal discharge: no Treatments attempted: increasing fluids   Her R knee has been bothering her a lot. She would like to see orthopedics.   HYPERTENSION Hypertension status: controlled  Satisfied with current treatment? yes Duration of hypertension: chronic BP monitoring frequency:  not checking BP medication side effects:  no Medication compliance: excellent compliance Recurrent headaches: no Visual  changes: no Palpitations: no Dyspnea: no Chest pain: no Lower extremity edema: no Dizzy/lightheaded: no    Relevant past medical, surgical, family and social history reviewed and updated as indicated. Interim medical history since our last visit reviewed. Allergies and medications reviewed and updated.  Review of Systems  Constitutional: Negative.   Respiratory: Negative.   Cardiovascular: Negative.   Gastrointestinal: Positive for abdominal pain. Negative for abdominal distention, anal bleeding, blood in stool, constipation, diarrhea, nausea, rectal pain and vomiting.  Genitourinary: Positive for dysuria, frequency and urgency. Negative for decreased urine volume, difficulty urinating, dyspareunia, enuresis, flank pain, genital sores, hematuria, menstrual problem, pelvic pain, vaginal bleeding, vaginal discharge and vaginal pain.  Musculoskeletal: Negative.   Neurological: Negative.   Psychiatric/Behavioral: Negative.     Per HPI unless specifically indicated above     Objective:    BP 116/78   Pulse 87   Temp 98.3 F (36.8 C)   Wt 208 lb (94.3 kg)   SpO2 94%   BMI 33.57 kg/m   Wt Readings from Last 3 Encounters:  05/11/20 208 lb (94.3 kg)  04/17/20 208 lb (94.3 kg)  02/10/20 210 lb 6.4 oz (95.4 kg)    Physical Exam Vitals and nursing note reviewed.  Constitutional:      General: She is not in acute distress.    Appearance: Normal appearance. She is obese. She is not ill-appearing, toxic-appearing or diaphoretic.  HENT:     Head: Normocephalic and atraumatic.     Right Ear: External ear normal.     Left Ear: External ear normal.     Nose: Nose normal.  Mouth/Throat:     Mouth: Mucous membranes are moist.     Pharynx: Oropharynx is clear.  Eyes:     General: No scleral icterus.       Right eye: No discharge.        Left eye: No discharge.     Extraocular Movements: Extraocular movements intact.     Conjunctiva/sclera: Conjunctivae normal.     Pupils:  Pupils are equal, round, and reactive to light.  Cardiovascular:     Rate and Rhythm: Normal rate and regular rhythm.     Pulses: Normal pulses.     Heart sounds: Normal heart sounds. No murmur heard. No friction rub. No gallop.   Pulmonary:     Effort: Pulmonary effort is normal. No respiratory distress.     Breath sounds: Normal breath sounds. No stridor. No wheezing, rhonchi or rales.  Chest:     Chest wall: No tenderness.  Musculoskeletal:        General: Normal range of motion.     Cervical back: Normal range of motion and neck supple.  Skin:    General: Skin is warm and dry.     Capillary Refill: Capillary refill takes less than 2 seconds.     Coloration: Skin is not jaundiced or pale.     Findings: No bruising, erythema, lesion or rash.  Neurological:     General: No focal deficit present.     Mental Status: She is alert and oriented to person, place, and time. Mental status is at baseline.  Psychiatric:        Mood and Affect: Mood normal.        Behavior: Behavior normal.        Thought Content: Thought content normal.        Judgment: Judgment normal.     Results for orders placed or performed in visit on 05/11/20  Urine Culture   Specimen: Urine   UR  Result Value Ref Range   Urine Culture, Routine Final report    Organism ID, Bacteria Comment   Microscopic Examination   Urine  Result Value Ref Range   WBC, UA 0-5 0 - 5 /hpf   RBC 0-2 0 - 2 /hpf   Epithelial Cells (non renal) 0-10 0 - 10 /hpf   Bacteria, UA Few (A) None seen/Few  CBC With Differential/Platelet  Result Value Ref Range   WBC 11.4 (H) 3.4 - 10.8 x10E3/uL   RBC 4.86 3.77 - 5.28 x10E6/uL   Hemoglobin 13.1 11.1 - 15.9 g/dL   Hematocrit 43.0 34.0 - 46.6 %   MCV 89 79 - 97 fL   MCH 27.0 26.6 - 33.0 pg   MCHC 30.5 (L) 31.5 - 35.7 g/dL   RDW 16.6 (H) 11.7 - 15.4 %   Platelets 352 150 - 450 x10E3/uL   Neutrophils 62 Not Estab. %   Lymphs 22 Not Estab. %   MID 16 Not Estab. %   Neutrophils  Absolute 7.0 1.4 - 7.0 x10E3/uL   Lymphocytes Absolute 2.6 0.7 - 3.1 x10E3/uL   MID (Absolute) 1.8 (H) 0.1 - 1.6 X10E3/uL  Comprehensive metabolic panel  Result Value Ref Range   Glucose 117 (H) 65 - 99 mg/dL   BUN 26 8 - 27 mg/dL   Creatinine, Ser 1.73 (H) 0.57 - 1.00 mg/dL   eGFR 30 (L) >59 mL/min/1.73   BUN/Creatinine Ratio 15 12 - 28   Sodium 141 134 - 144 mmol/L   Potassium 4.6 3.5 - 5.2  mmol/L   Chloride 102 96 - 106 mmol/L   CO2 22 20 - 29 mmol/L   Calcium 9.2 8.7 - 10.3 mg/dL   Total Protein 7.0 6.0 - 8.5 g/dL   Albumin 4.3 3.7 - 4.7 g/dL   Globulin, Total 2.7 1.5 - 4.5 g/dL   Albumin/Globulin Ratio 1.6 1.2 - 2.2   Bilirubin Total 0.4 0.0 - 1.2 mg/dL   Alkaline Phosphatase 97 44 - 121 IU/L   AST 15 0 - 40 IU/L   ALT 10 0 - 32 IU/L  Urinalysis, Routine w reflex microscopic  Result Value Ref Range   Specific Gravity, UA 1.015 1.005 - 1.030   pH, UA 6.0 5.0 - 7.5   Color, UA Green (A) Yellow   Appearance Ur Clear Clear   Leukocytes,UA Negative Negative   Protein,UA 1+ (A) Negative/Trace   Glucose, UA 3+ (A) Negative   Ketones, UA Negative Negative   RBC, UA Trace (A) Negative   Bilirubin, UA Negative Negative   Urobilinogen, Ur 0.2 0.2 - 1.0 mg/dL   Nitrite, UA Negative Negative   Microscopic Examination See below:   Bayer DCA Hb A1c Waived  Result Value Ref Range   HB A1C (BAYER DCA - WAIVED) 7.7 (H) <7.0 %      Assessment & Plan:   Problem List Items Addressed This Visit      Endocrine   Type 2 diabetes mellitus with stage 3 chronic kidney disease (HCC) - Primary    Increased with A1c of 7.7 up from 7.0. Will increase her trulicity to 4.5 and recheck 3 months. Call with any concerns.       Relevant Medications   glipiZIDE (GLUCOTROL) 5 MG tablet   insulin degludec (TRESIBA FLEXTOUCH) 100 UNIT/ML FlexTouch Pen   Dulaglutide (TRULICITY) 4.5 IO/9.7DZ SOPN   Other Relevant Orders   Bayer DCA Hb A1c Waived (Completed)     Genitourinary   Benign  hypertensive renal disease    Under good control on current regimen. Continue current regimen. Continue to monitor. Call with any concerns. Refills given. Labs drawn today.          Other   Right knee pain    Referral to ortho made today. Call with any concerns.       Relevant Orders   Ambulatory referral to Orthopedic Surgery    Other Visit Diagnoses    Abdominal pain, unspecified abdominal location       Will check labs today and treat as needed. Concern for constipation- will start miralax. Await results. Treat as needed.   Relevant Orders   CBC With Differential/Platelet (Completed)   Comprehensive metabolic panel (Completed)   Urinalysis, Routine w reflex microscopic (Completed)   Microscopic Examination (Completed)   Overflow incontinence of urine       Discussed that there is not medicine for overflow incontinence. Start timed voids. Follow up with urology as needed.    Hematuria, unspecified type       Will check labs today and treat as needed. Concern for UTI vs irritation from glucosuria. Await results. Treat as needed.    Relevant Orders   Urine Culture (Completed)   Microscopic Examination (Completed)       Follow up plan: Return in about 3 weeks (around 06/01/2020).

## 2020-05-11 NOTE — Patient Instructions (Signed)
Visit Information  PATIENT GOALS: Goals Addressed            This Visit's Progress   . RNCM: Prevent Falls and Injury       Follow Up Date 05-11-2020   - always use handrails on the stairs - always wear low-heeled or flat shoes or slippers with nonskid soles - call the doctor if I am feeling too drowsy - install bathroom grab bars - keep a flashlight by the bed - keep my cell phone with me always - learn how to get back up if I fall - make an emergency alert plan in case I fall - pick up clutter from the floors - use a nonslip pad with throw rugs, or remove them completely - use a cane or walker - use a nightlight in the bathroom - wear my glasses and/or hearing aid    Why is this important?    Most falls happen when it is hard for you to walk safely. Your balance may be off because of an illness. You may have pain in your knees, hip or other joints.   You may be overly tired or taking medicines that make you sleepy. You may not be able to see or hear clearly.   Falls can lead to broken bones, bruises or other injuries.   There are things you can do to help prevent falling.     Notes: Fall in February with admit to the hospital- UTI    . COMPLETED: RNCM: Track and Manage My Blood Pressure       Follow Up per outreach The patient is taking as directed - check blood pressure 3 times per week - write blood pressure results in a log or diary    Why is this important?   You won't feel high blood pressure, but it can still hurt your blood vessels.  High blood pressure can cause heart or kidney problems. It can also cause a stroke.  Making lifestyle changes like losing a little weight or eating less salt will help.  Checking your blood pressure at home and at different times of the day can help to control blood pressure.  If the doctor prescribes medicine remember to take it the way the doctor ordered.  Call the office if you cannot afford the medicine or if there are  questions about it.     Notes: The patient is compliant with blood pressure management at this time. Doing well.       Patient Care Plan: RNCM: Chronic Pain (Adult)    Problem Identified: RNCM: Pain Management Plan (Chronic Pain)   Priority: High    Long-Range Goal: Pain Management Plan Developed   Expected End Date: 03/11/2020  Priority: High  Note:   CARE PLAN ENTRY (see longitudinal plan of care for additional care plan information)  Current Barriers:  [name] Knowledge Deficits related to how to best control post operative knee replacement pain- right knee . Chronic Disease Management support and education needs related to chronic pain and discomfort related to knee pain and post surgical knee replacement surgery and back pain  Nurse Case Manager Clinical Goal(s):  [name] Over the next 120 days, patient will verbalize understanding of plan for pain management for knee pain . Over the next 120 days, patient will attend all scheduled medical appointments: next appointment with the pcp on 05-11-2020- the patient sees pcp on a regular basis. . Over the next 120 days, patient will demonstrate improved adherence to prescribed  treatment plan for pain relief of post surgical right knee pain as evidenced bypain relief and increased activity level . Over the next 120 days, patient will demonstrate improved health management independence as evidenced byrecovery from knee replacement surgery   Interventions:  . Inter-disciplinary care team collaboration (see longitudinal plan of care) . Evaluation of current treatment plan related to pain control and patient's adherence to plan as established by provider. 05-11-2020: The patient states she is still having pain in her right knee with popping at times. Evaluation if she has dicussed with the surgeon and she has not. The patient will talk to the pcp today about this. The patient also has back pain and discomfort. Had a fall recently and went to the hospital to  be evaluated. Had a UTI. Has finished course of abx.   . Advised patient to call provider for changes in level or intensity of pain and discomfort. 05-11-2020: Will discuss back pain and knee pain today with the provider.  . Provided education to patient re: alternative methods of pain control and to continue to work with PT to manage pain and discomfort.  . Reviewed medications with patient and discussed patient states opioids make her sick on her stomach. Does not like to take them but will for uncontrolled pain.  . Reviewed scheduled/upcoming provider appointments including: sees pcp today 05-11-2020.  Patient Self Care Activities:  . Patient verbalizes understanding of plan to work with PT and call the provider for changes in pain level . Attends all scheduled provider appointments . Calls provider office for new concerns or questions . - pain assessed . - pain treatment goals reviewed . - patient response to treatment assessed  Follow up cal with the patient on 06-15-2020 at 1:45 pm    Task: RNCM: Partner to Develop Chronic Pain Management Plan   Note:   Care Management Activities:    - pain assessed - pain treatment goals reviewed - patient response to treatment assessed    Notes: The patients pain is controlled at this time   Patient Care Plan: RNCM: Hypertension (Adult)    Problem Identified: RNCM: Hypertension (Hypertension)   Priority: Medium    Goal: RNCM: Hypertension Monitored   Note:   Objective:  . Last practice recorded BP readings:  . BP Readings from Last 3 Encounters: .  05/11/20 . 116/78 .  04/18/20 . 120/64 .  02/10/20 . 108/70 .    [name] Most recent eGFR/CrCl: No results found for: EGFR  No components found for: CRCL Current Barriers:  [name] Knowledge Deficits related to basic understanding of hypertension and hyperlipidemia pathophysiology and self care management . Unable to independently always follow a heart healthy/ADA diet Case Manager Clinical Goal(s):   [name] Over the next 120 days, patient will verbalize understanding of plan for hypertension management . Over the next 120 days, patient will attend all scheduled medical appointments: 05-11-2020 at 2:20 pm- currently seeing the provider . Over the next 120 days, patient will demonstrate improved adherence to prescribed treatment plan for hypertension as evidenced by taking all medications as prescribed, monitoring and recording blood pressure as directed, adhering to low sodium/DASH diet . Over the next 120 days, patient will demonstrate improved health management independence as evidenced by checking blood pressure as directed and notifying PCP if SBP>160 or DBP > 90, taking all medications as prescribe, and adhering to a low sodium diet as discussed. Interventions:  . Evaluation of current treatment plan related to hypertension self management and patient's  adherence to plan as established by provider. . Provided education to patient re: stroke prevention, s/s of heart attack and stroke, DASH diet, complications of uncontrolled blood pressure . Discussed plans with patient for ongoing care management follow up and provided patient with direct contact information for care management team . Advised patient, providing education and rationale, to monitor blood pressure daily and record, calling PCP for findings outside established parameters.  . Reviewed scheduled/upcoming provider appointments including: 06-15-2020 at 1:45 pm with RNCM, follows up the the pcp as needed . - blood pressure trends reviewed . - home or ambulatory blood pressure monitoring encouraged Patient Goals/Self-Care Activities . Over the next 120 days, patient will:  - Follows a low sodium diet/DASH diet Follow Up Plan: Face to Face appointment with care management team member scheduled for:   06-15-2020 at 1:45 pm   Task: RNCM: Identify and Monitor Blood Pressure Elevation   Note:   Care Management Activities:    - blood pressure  trends reviewed - home or ambulatory blood pressure monitoring encouraged       Patient Care Plan: RNCM:Diabetes Type 2 (Adult)    Problem Identified: RNCM: Glycemic Management (Diabetes, Type 2)   Priority: Medium  Note:   CARE PLAN ENTRY (see longtitudinal plan of care for additional care plan information)  Objective:  Lab Results  Component Value Date   HGBA1C 7.7 (H) 11/11/2019 .   Lab Results  Component Value Date   CREATININE 1.61 (H) 11/27/2019   CREATININE 1.77 (H) 11/11/2019   CREATININE 1.49 (H) 09/02/2019 .   [name] No results found for: EGFR  Current Barriers:  [name] Knowledge Deficits related to basic Diabetes pathophysiology and self care/management . Knowledge Deficits related to medications used for management of diabetes  Case Manager Clinical Goal(s):  Over the next 120 days, patient will demonstrate improved adherence to prescribed treatment plan for diabetes self care/management as evidenced by:  . daily monitoring and recording of CBG  . adherence to ADA/ carb modified diet . exercise 3/4 days/week . adherence to prescribed medication regimen  Interventions:  . Provided education to patient about basic DM disease process . Reviewed medications with patient and discussed importance of medication adherence . Discussed plans with patient for ongoing care management follow up and provided patient with direct contact information for care management team . Provided patient with written educational materials related to hypo and hyperglycemia and importance of correct treatment . Reviewed scheduled/upcoming provider appointments including: 02-10-2020 at 2:20 pm . Advised patient, providing education and rationale, to check cbg BID and record, calling pcp for findings outside established parameters.   . Review of patient status, including review of consultants reports, relevant laboratory and other test results, and medications completed.  Patient Self Care  Activities:  . UNABLE to independently manage DM as evidence of hemoglobin A1C of 7.7 on 11-11-2019  Initial goal documentation    Long-Range Goal: RNCM: Glycemic Management Optimized   Priority: Medium  Note:   Objective:  Lab Results  Component Value Date   HGBA1C 7.0 (H) 02/10/2020 .   Lab Results  Component Value Date   CREATININE 1.75 (H) 02/10/2020   CREATININE 1.70 (H) 01/19/2020   CREATININE 1.61 (H) 11/27/2019 .   [name] No results found for: EGFR Current Barriers:  [name] Knowledge Deficits related to basic Diabetes pathophysiology and self care/management . Knowledge Deficits related to medications used for management of diabetes . Unable to independently manage DM as evidence of elevated A1C at times,  better control now and most recent 7.0 Case Manager Clinical Goal(s):  [name] Over the next 120 days, patient will demonstrate improved adherence to prescribed treatment plan for diabetes self care/management as evidenced by:  . daily monitoring and recording of CBG  . adherence to ADA/ carb modified diet . adherence to prescribed medication regimen Interventions:  . Provided education to patient about basic DM disease process . Discussed plans with patient for ongoing care management follow up and provided patient with direct contact information for care management team . Provided patient with written educational materials related to hypo and hyperglycemia and importance of correct treatment . Reviewed scheduled/upcoming provider appointments including: 05-11-2020 at 2:20 pm . Advised patient, providing education and rationale, to check cbg TID and record, calling pcp for findings outside established parameters.  05-11-2020: Blood sugars elevated since taking prednisone. States they are stabilized.  Denies any blood sugars >200.  States readings recently 118-140.  [name] Review of patient status, including review of consultants reports, relevant laboratory and other test results, and medications  completed. Patient Goals/Self-Care Activities . Over the next 120 days, patient will:  - Checks blood sugars as prescribed and utilize hyper and hypoglycemia protocol as needed Adheres to prescribed ADA/carb modified Follow Up Plan:  Telephone follow up appointment with care management team member scheduled for: 06-15-2020 1:45 pm   Task: RNCM: Alleviate Barriers to Glycemic Management   Note:   Care Management Activities:    - A1C testing facilitated - barriers to adherence to treatment plan identified - blood glucose monitoring encouraged - blood glucose readings reviewed - mutual A1C goal set or reviewed - self-awareness of signs/symptoms of hypo or hyperglycemia encouraged - use of blood glucose monitoring log promoted    Notes: the patient is currently compliant with DM management    Patient Care Plan: RNCM: COPD (Adult)    Problem Identified: RNCM: Psychological Adjustment to Diagnosis (COPD)   Priority: Medium    Long-Range Goal: RNCM: COPD/Pneumonia/Lung nodule   Priority: Medium  Note:   Current Barriers:  [name] Knowledge deficits related to basic understanding of COPD/pneumonia/ lung nodule disease process . Knowledge deficits related to basic COPD/ lung nodule/pneumonia self care/management . Knowledge deficit related to basic understanding of how to use inhalers and how inhaled medications work . Knowledge deficit related to importance of energy conservation . Unable to independently manage COPD/pneumonia and recent diagnosis of lung nodule . Lacks social connections . Unable to perform IADLs independently . Does not contact provider office for questions/concerns  Case Manager Clinical Goal(s):  Over the next 120 days patient will report using inhalers as prescribed including rinsing mouth after use  Over the next 120 days, patient will be able to verbalize understanding of COPD action plan and when to seek appropriate levels of medical care  Over the next 120 days,  patient will engage in lite exercise as tolerated to build/regain stamina and strength and reduce shortness of breath through activity tolerance  Over the next 120 days, patient will verbalize basic understanding of COPD disease process and self care activities  Over the next 120 days, patient will not be hospitalized for COPD exacerbation as evidenced  Interventions:  . Collaboration with [name], DO regarding development and update of comprehensive plan of care as evidenced by provider attestation and co-signature . Inter-disciplinary care team collaboration (see longitudinal plan of care)  Provided patient with basic written and verbal COPD/Pneumonia/Lung nodule education on self care/management/and exacerbation prevention   Provided patient with  COPD /Pneumonia/Lung nodule action plan and reinforced importance of daily self assessment  Provided written and verbal instructions on pursed lip breathing and utilized returned demonstration as teach back  Provided instruction about proper use of medications used for management of COPD including inhalers  Advised patient to self assesses COPD/Pneumonia/Lung nodule  action plan zone and make appointment with provider if in the yellow zone for 48 hours without improvement.  Provided patient with education about the role of exercise in the management of COPD  Advised patient to engage in light exercise as tolerated 3-5 days a week  Provided education about and advised patient to utilize infection prevention strategies to reduce risk of respiratory infection  Patient Goals/Self-Care Activities:  . - decision-making supported . - depression screen reviewed . - emotional support provided . - family involvement promoted . - problem-solving facilitated . - relaxation techniques promoted . - verbalization of feelings encouraged Follow Up Plan: Telephone follow up appointment with care management team member scheduled for: 06-15-2020 at  1:45 pm   Task: RNCM: Support Psychosocial Response to Chronic Obstructive Pulmonary Disease   Note:   Care Management Activities:    - decision-making supported - depression screen reviewed - emotional support provided - family involvement promoted - problem-solving facilitated - relaxation techniques promoted - verbalization of feelings encouraged     Patient Care Plan: RNCM; Fall Risk (Adult)    Problem Identified: RNCM: Fall Risk   Priority: High    Goal: RNCM: Absence of Fall and Fall-Related Injury   Priority: High  Note:   Current Barriers:  [name] Knowledge Deficits related to fall precautions in patient with chronic pain, and several other chronic conditions impacting care  . Decreased adherence to prescribed treatment for fall prevention . Unable to perform IADLs independently . Does not contact provider office for questions/concerns . Chronic Disease Management support and education needs related to increased falls risk in patient with multiple chronic conditions  Clinical Goal(s):  . patient will demonstrate improved adherence to prescribed treatment plan for decreasing falls as evidenced by patient reporting and review of EMR . patient will verbalize using fall risk reduction strategies discussed . patient will not experience additional falls . patient will verbalize understanding of plan for effective management of falls prevention and safety in the home Interventions:  . Collaboration with [name], DO regarding development and update of comprehensive plan of care as evidenced by provider attestation and co-signature . Inter-disciplinary care team collaboration (see longitudinal plan of care) . Provided written and verbal education re: Potential causes of falls and Fall prevention strategies . Reviewed medications and discussed potential side effects of medications such as dizziness and frequent urination . Assessed for s/s of orthostatic  hypotension . Assessed for falls since last encounter. . Assessed patients knowledge of fall risk prevention secondary to previously provided education. . Assessed working status of life alert bracelet and patient adherence . Provided patient information for fall alert systems . Evaluation of current treatment plan related to falls prevention and safety  and patient's adherence to plan as established by provider. . Advised patient to call the office for new falls/changes in chronic conditions or questions . Provided education to patient re: falls prevention and safety and monitoring for changes in conditions that may put patient at increase risk of falls . Provided patient with falls prevention  educational materials related to preventing falls and being safe in home environment  . Discussed plans with patient for ongoing care management follow up  and provided patient with direct contact information for care management team Self-Care Deficits:  Lacks social connections Unable to perform IADLs independently Does not contact provider office for questions/concerns Patient Goals:  - Utilize walker and wheelchair (assistive device) appropriately with all ambulation - De-clutter walkways - Change positions slowly - Wear secure fitting shoes at all times with ambulation - Utilize home lighting for dim lit areas - Demonstrate self and pet awareness at all times - activities of daily living skills assessed - assistive or adaptive device use encouraged - barriers to physical activity or exercise addressed - barriers to physical activity or exercise identified - barriers to safety identified - cognition assessed - cognitive-stimulating activities promoted - fall prevention plan reviewed and updated - fear of falling, loss of independence and pain acknowledged Follow Up Plan: Telephone follow up appointment with care management team member scheduled for: 06-15-2020 at 1:45 pm   Task: RNCM: Identify  and Manage Contributors to Fall Risk   Note:   Care Management Activities:    - activities of daily living skills assessed - assistive or adaptive device use encouraged - barriers to physical activity or exercise addressed - barriers to physical activity or exercise identified - barriers to safety identified - cognition assessed - cognitive-stimulating activities promoted - fall prevention plan reviewed and updated - fear of falling, loss of independence and pain acknowledged         Patient verbalizes understanding of instructions provided today and agrees to view in [place].   Telephone follow up appointment with care management team member scheduled for: 06-15-2020 at 1:45 pm  [place], MSN, [facility] Mobile: [phone]

## 2020-05-11 NOTE — Chronic Care Management (AMB) (Signed)
Chronic Care Management   CCM RN Visit Note  05/11/2020 Name: Tammy [name] MRN: [id number] DOB: Jul 06, 1945  Subjective: Tammy [name] is a 75 y.o. year old female who is a primary care patient of [name], DO. The care management team was consulted for assistance with disease management and care coordination needs.    Engaged with patient face to face for follow up visit in response to provider referral for case management and/or care coordination services.   Consent to Services:  The patient was given information about Chronic Care Management services, agreed to services, and gave verbal consent prior to initiation of services.  Please see initial visit note for detailed documentation.   Patient agreed to services and verbal consent obtained.   Assessment: Review of patient past medical history, allergies, medications, health status, including review of consultants reports, laboratory and other test data, was performed as part of comprehensive evaluation and provision of chronic care management services.   SDOH (Social Determinants of Health) assessments and interventions performed:    CCM Care Plan  Allergies  Allergen Reactions  . Hydrocodone Other (See Comments)    Unresponsive  . Aleve [Naproxen Sodium]     Hives & itching   . Contrast Media [Iodinated Diagnostic Agents]     Pt avoids due to kidney issues  . Ioxaglate Other (See Comments)    (iodine) Pt avoids due to kidney issues   . Oxycodone Other (See Comments)    hallucinations  . Ranexa [Ranolazine]     Sick on stomach  . Sulfa Antibiotics Other (See Comments)    Pt avoids due to kidney issues  . Tramadol Other (See Comments)    nightmares    Outpatient Encounter Medications as of 05/11/2020  Medication Sig  . anastrozole (ARIMIDEX) 1 MG tablet Take 1 tablet (1 mg total) by mouth daily.  . blood glucose meter kit and supplies KIT Dispense based on patient and insurance preference. Use one time daily as  directed, E11.9  . Calcium Carb-Cholecalciferol (CALCIUM 600 + D) 600-200 MG-UNIT TABS Take 1 tablet by mouth daily.   . cetirizine (ZYRTEC) 10 MG tablet TAKE 1 TABLET BY MOUTH DAILY  . cholecalciferol (VITAMIN D3) 25 MCG (1000 UT) tablet Take 1,000 Units by mouth daily.  . Dulaglutide (TRULICITY) 3 VC/9.4WH SOPN Inject 0.5 mLs (3 mg total) into the skin once a week. (Patient taking differently: Inject 3 mg into the skin every Tuesday.)  . DULoxetine (CYMBALTA) 60 MG capsule TAKE 1 CAPSULE BY MOUTH DAILY (Patient taking differently: Take 60 mg by mouth daily.)  . ELIQUIS 2.5 MG TABS tablet TAKE 1 TABLET BY MOUTH TWICE DAILY (Patient taking differently: Take 2.5 mg by mouth 2 (two) times daily.)  . gabapentin (NEURONTIN) 300 MG capsule Take 1 capsule (300 mg total) by mouth 3 (three) times daily.  [name] glipiZIDE (GLUCOTROL) 5 MG tablet TAKE ONE TABLET BY MOUTH EVERY DAY BEFORE BREAKFAST (Patient taking differently: Take 5 mg by mouth daily before breakfast.)  . insulin degludec (TRESIBA FLEXTOUCH) 100 UNIT/ML FlexTouch Pen Inject 0.24 mLs (24 Units total) into the skin daily. (Patient taking differently: Inject 24 Units into the skin at bedtime.)  . Insulin Pen Needle (PEN NEEDLES 3/16") 31G X 5 MM MISC 1 each by Does not apply route daily.  . isosorbide mononitrate (IMDUR) 60 MG 24 hr tablet TAKE ONE TABLET BY MOUTH AT BEDTIME AND HALF A TABLET EVERY MORNING (Patient taking differently: Take 30-60 mg by mouth See admin  instructions. Take  tablet (36m) by mouth every morning and take 1 tablet (624m by mouth every night)  . JARDIANCE 25 MG TABS tablet TAKE ONE TABLET 25MG EVERY DAY BEFORE BREAKFAST (Patient taking differently: Take 25 mg by mouth daily before breakfast.)  . Meth-Hyo-M Bl-Na Phos-Ph Sal (URO-MP) 118 MG CAPS TAKE 1 CAPSULE BY MOUTH 3 TIMES DAILY ASNEEDED ( URINARY FREQUENCY,URGENCY, BURNING. (Patient taking differently: Take 1 capsule by mouth 3 (three) times daily as needed (urinary  frequency, urgency or burning).)  . metoprolol succinate (TOPROL-XL) 50 MG 24 hr tablet TAKE ONE TABLET EVERY DAY WITH OR IMMEDIATELY FOLLOWING A MEAL (Patient taking differently: Take 50 mg by mouth daily.)  . mometasone (ELOCON) 0.1 % lotion Apply topically as directed. Qd up to 5 days a week to aa scalp until clear, then prn flares (Patient taking differently: Apply 1 application topically daily as needed (scalp irritation).)  . montelukast (SINGULAIR) 10 MG tablet Take 1 tablet (10 mg total) by mouth at bedtime.  . Marland KitchenYRBETRIQ 50 MG TB24 tablet TAKE 1 TABLET BY MOUTH DAILY  . nitroGLYCERIN (NITROSTAT) 0.4 MG SL tablet Place 1 tablet (0.4 mg total) under the tongue every 5 (five) minutes as needed for chest pain.  . pantoprazole (PROTONIX) 40 MG tablet Take 1 tablet (40 mg total) by mouth daily as needed. (Patient taking differently: Take 40 mg by mouth daily.)  . Polyethylene Glycol 400 (BLINK TEARS) 0.25 % SOLN Place 1 drop into both eyes 3 (three) times daily as needed (dry/irritated eyes.).   . Marland KitchenROAIR HFA 108 (90 Base) MCG/ACT inhaler TAKE 2 PUFFS EVERY 4 HOURS AS NEEDED FORWHEEZING OR SHORTNESS OF BREATH  . rosuvastatin (CRESTOR) 40 MG tablet TAKE 1 TABLET BY MOUTH ONCE DAILY (Patient taking differently: Take 40 mg by mouth daily.)  . senna (SENOKOT) 8.6 MG TABS tablet Take 8.6 mg by mouth daily as needed for mild constipation.  . Marland KitchenYNTHROID 150 MCG tablet TAKE 1 TABLET EVERY DAY ON EMPTY STOMACHWITH A GLASS OF WATER AT LEAST 30-60 MINBEFORE BREAKFAST (Patient taking differently: Take 150 mcg by mouth daily before breakfast.)  . trimethoprim (TRIMPEX) 100 MG tablet TAKE ONE TABLET BY MOUTH EVERY DAY (Patient taking differently: Take 100 mg by mouth daily.)   No facility-administered encounter medications on file as of 05/11/2020.    Patient Active Problem List   Diagnosis Date Noted  . Sepsis (HC[place]02/07/2020  . Acute lower UTI 04/17/2020  . Fall 04/17/2020  . Anemia 02/16/2020  . Asthma  02/16/2020  . History of cardiac catheterization 02/16/2020  . History of left mastectomy 02/16/2020  . Malignant tumor of breast (HCH[facility]12/08/2019  . History of total knee arthroplasty 11/18/2019  . Unstable angina ([facility]CR[place]01/21/2021  . Dysphagia   . Thickening of esophagus   . Stomach irritation   . Gastric polyp   . Positive occult stool blood test   . Polyp of colon   . Hyperkalemia 11/27/2018  . Proteinuria 11/27/2018  . Type 2 diabetes mellitus with stage 3 chronic kidney disease (HC[facility]icons[place]09/16/2020  . Carcinoma of lower-inner quadrant of left breast in female, estrogen receptor positive (HC[facility]07/22/2020  . Right knee pain 08/01/2017  . Chronic right-sided low back pain with right-sided sciatica 05/28/2017  . Gait abnormality 05/28/2017  . Hypothyroid 07/18/2016  . COPD (chronic obstructive pulmonary disease) (HC[facility]05/10/2016  . Encephalomalacia on imaging study 12/10/2015  . Calcification of both carotid arteries 12/10/2015  . Benign neoplasm of sigmoid colon   . Benign  neoplasm of cecum   . Benign neoplasm of ascending colon   . Bilateral atelectasis 04/11/2015  . Fatty liver disease, nonalcoholic 47/11/6281  . Hydronephrosis of right kidney 04/11/2015  . Abdominal aortic ectasia ([place]) 04/11/2015  . Constipation 04/07/2015  . Abdominal aortic atherosclerosis ([place]) 12/18/2014  . Diverticulosis of colon 12/18/2014  . Rhinitis, allergic 10/20/2014  . Coronary artery disease   . Essential hypertension   . Morbid obesity due to excess calories ([place])   . Spinal stenosis of lumbar region 06/06/2014  . Herniated nucleus pulposus 06/06/2014  . Neuralgia neuritis, sciatic nerve 05/24/2014  . Pulmonary embolism ([place]) 01/30/2014  . Benign hypertensive renal disease 12/16/2013  . Uncontrolled type 2 diabetes mellitus with hyperglycemia, with long-term current use of insulin ([place]) 12/16/2013  . Chronic kidney disease 12/16/2013  . Frequent UTI 11/03/2013  . Overactive bladder  11/03/2013  . Coronary atherosclerosis of native coronary artery 06/19/2013  . History of back surgery 06/19/2013  . Hyperlipidemia 06/19/2013  . PAD (peripheral artery disease) ([place]) 06/19/2013  . Congenital obstruction of ureteropelvic junction 11/21/2011    Conditions to be addressed/monitored:HTN, COPD, DMII and Chronic back and knee pain, lung nodule, and recent pneumonia  Care Plan : RNCM: Chronic Pain (Adult)  Updates made by [name] since 05/11/2020 12:00 AM    Problem: RNCM: Pain Management Plan (Chronic Pain)   Priority: High    Long-Range Goal: Pain Management Plan Developed   Expected End Date: 03/11/2020  Priority: High  Note:   CARE PLAN ENTRY (see longitudinal plan of care for additional care plan information)  Current Barriers:  [name] Knowledge Deficits related to how to best control post operative knee replacement pain- right knee . Chronic Disease Management support and education needs related to chronic pain and discomfort related to knee pain and post surgical knee replacement surgery and back pain  Nurse Case Manager Clinical Goal(s):  [name] Over the next 120 days, patient will verbalize understanding of plan for pain management for knee pain . Over the next 120 days, patient will attend all scheduled medical appointments: next appointment with the pcp on 05-11-2020- the patient sees pcp on a regular basis. . Over the next 120 days, patient will demonstrate improved adherence to prescribed treatment plan for pain relief of post surgical right knee pain as evidenced bypain relief and increased activity level . Over the next 120 days, patient will demonstrate improved health management independence as evidenced byrecovery from knee replacement surgery   Interventions:  . Inter-disciplinary care team collaboration (see longitudinal plan of care) . Evaluation of current treatment plan related to pain control and patient's adherence to plan as established by provider.  05-11-2020: The patient states she is still having pain in her right knee with popping at times. Evaluation if she has dicussed with the surgeon and she has not. The patient will talk to the pcp today about this. The patient also has back pain and discomfort. Had a fall recently and went to the hospital to be evaluated. Had a UTI. Has finished course of abx.   . Advised patient to call provider for changes in level or intensity of pain and discomfort. 05-11-2020: Will discuss back pain and knee pain today with the provider.  . Provided education to patient re: alternative methods of pain control and to continue to work with PT to manage pain and discomfort.  . Reviewed medications with patient and discussed patient states opioids make her sick on her stomach. Does not like  to take them but will for uncontrolled pain.  . Reviewed scheduled/upcoming provider appointments including: sees pcp today 05-11-2020.  Patient Self Care Activities:  . Patient verbalizes understanding of plan to work with PT and call the provider for changes in pain level . Attends all scheduled provider appointments . Calls provider office for new concerns or questions . - pain assessed . - pain treatment goals reviewed . - patient response to treatment assessed  Follow up cal with the patient on 06-15-2020 at 1:45 pm    Task: RNCM: Partner to Develop Chronic Pain Management Plan   Note:   Care Management Activities:    - pain assessed - pain treatment goals reviewed - patient response to treatment assessed    Notes: The patients pain is controlled at this time   Care Plan : RNCM: Hypertension (Adult)  Updates made by [name] since 05/11/2020 12:00 AM    Problem: RNCM: Hypertension (Hypertension)   Priority: Medium    Goal: RNCM: Hypertension Monitored   Note:   Objective:  . Last practice recorded BP readings:  . BP Readings from Last 3 Encounters: .  05/11/20 . 116/78 .  04/18/20 . 120/64 .  02/10/20  . 108/70 .    [name] Most recent eGFR/CrCl: No results found for: EGFR  No components found for: CRCL Current Barriers:  [name] Knowledge Deficits related to basic understanding of hypertension and hyperlipidemia pathophysiology and self care management . Unable to independently always follow a heart healthy/ADA diet Case Manager Clinical Goal(s):  [name] Over the next 120 days, patient will verbalize understanding of plan for hypertension management . Over the next 120 days, patient will attend all scheduled medical appointments: 05-11-2020 at 2:20 pm- currently seeing the provider . Over the next 120 days, patient will demonstrate improved adherence to prescribed treatment plan for hypertension as evidenced by taking all medications as prescribed, monitoring and recording blood pressure as directed, adhering to low sodium/DASH diet . Over the next 120 days, patient will demonstrate improved health management independence as evidenced by checking blood pressure as directed and notifying PCP if SBP>160 or DBP > 90, taking all medications as prescribe, and adhering to a low sodium diet as discussed. Interventions:  . Evaluation of current treatment plan related to hypertension self management and patient's adherence to plan as established by provider. . Provided education to patient re: stroke prevention, s/s of heart attack and stroke, DASH diet, complications of uncontrolled blood pressure . Discussed plans with patient for ongoing care management follow up and provided patient with direct contact information for care management team . Advised patient, providing education and rationale, to monitor blood pressure daily and record, calling PCP for findings outside established parameters.  . Reviewed scheduled/upcoming provider appointments including: 06-15-2020 at 1:45 pm with RNCM, follows up the the pcp as needed . - blood pressure trends reviewed . - home or ambulatory blood pressure monitoring encouraged Patient  Goals/Self-Care Activities . Over the next 120 days, patient will:  - Follows a low sodium diet/DASH diet Follow Up Plan: Face to Face appointment with care management team member scheduled for:   06-15-2020 at 1:45 pm   Care Plan : RNCM:Diabetes Type 2 (Adult)  Updates made by [name] since 05/11/2020 12:00 AM    Problem: RNCM: Glycemic Management (Diabetes, Type 2)   Priority: Medium  Note:   CARE PLAN ENTRY (see longtitudinal plan of care for additional care plan information)  Objective:  Lab Results  Component  Value Date   HGBA1C 7.7 (H) 11/11/2019 .   Lab Results  Component Value Date   CREATININE 1.61 (H) 11/27/2019   CREATININE 1.77 (H) 11/11/2019   CREATININE 1.49 (H) 09/02/2019 .   [name] No results found for: EGFR  Current Barriers:  [name] Knowledge Deficits related to basic Diabetes pathophysiology and self care/management . Knowledge Deficits related to medications used for management of diabetes  Case Manager Clinical Goal(s):  Over the next 120 days, patient will demonstrate improved adherence to prescribed treatment plan for diabetes self care/management as evidenced by:  . daily monitoring and recording of CBG  . adherence to ADA/ carb modified diet . exercise 3/4 days/week . adherence to prescribed medication regimen  Interventions:  . Provided education to patient about basic DM disease process . Reviewed medications with patient and discussed importance of medication adherence . Discussed plans with patient for ongoing care management follow up and provided patient with direct contact information for care management team . Provided patient with written educational materials related to hypo and hyperglycemia and importance of correct treatment . Reviewed scheduled/upcoming provider appointments including: 02-10-2020 at 2:20 pm . Advised patient, providing education and rationale, to check cbg BID and record, calling pcp for findings outside established  parameters.   . Review of patient status, including review of consultants reports, relevant laboratory and other test results, and medications completed.  Patient Self Care Activities:  . UNABLE to independently manage DM as evidence of hemoglobin A1C of 7.7 on 11-11-2019  Initial goal documentation    Long-Range Goal: RNCM: Glycemic Management Optimized   Priority: Medium  Note:   Objective:  Lab Results  Component Value Date   HGBA1C 7.0 (H) 02/10/2020 .   Lab Results  Component Value Date   CREATININE 1.75 (H) 02/10/2020   CREATININE 1.70 (H) 01/19/2020   CREATININE 1.61 (H) 11/27/2019 .   [name] No results found for: EGFR Current Barriers:  [name] Knowledge Deficits related to basic Diabetes pathophysiology and self care/management . Knowledge Deficits related to medications used for management of diabetes . Unable to independently manage DM as evidence of elevated A1C at times, better control now and most recent 7.0 Case Manager Clinical Goal(s):  [name] Over the next 120 days, patient will demonstrate improved adherence to prescribed treatment plan for diabetes self care/management as evidenced by:  . daily monitoring and recording of CBG  . adherence to ADA/ carb modified diet . adherence to prescribed medication regimen Interventions:  . Provided education to patient about basic DM disease process . Discussed plans with patient for ongoing care management follow up and provided patient with direct contact information for care management team . Provided patient with written educational materials related to hypo and hyperglycemia and importance of correct treatment . Reviewed scheduled/upcoming provider appointments including: 05-11-2020 at 2:20 pm . Advised patient, providing education and rationale, to check cbg TID and record, calling pcp for findings outside established parameters.  05-11-2020: Blood sugars elevated since taking prednisone. States they are stabilized.  Denies any blood  sugars >200.  States readings recently 118-140.  [name] Review of patient status, including review of consultants reports, relevant laboratory and other test results, and medications completed. Patient Goals/Self-Care Activities . Over the next 120 days, patient will:  - Checks blood sugars as prescribed and utilize hyper and hypoglycemia protocol as needed Adheres to prescribed ADA/carb modified Follow Up Plan:  Telephone follow up appointment with care management team member scheduled for: 06-15-2020 1:45 pm  Care Plan : RNCM: COPD (Adult)  Updates made by [name] since 05/11/2020 12:00 AM    Problem: RNCM: Psychological Adjustment to Diagnosis (COPD)   Priority: Medium    Long-Range Goal: RNCM: COPD/Pneumonia/Lung nodule   Priority: Medium  Note:   Current Barriers:  [name] Knowledge deficits related to basic understanding of COPD/pneumonia/ lung nodule disease process . Knowledge deficits related to basic COPD/ lung nodule/pneumonia self care/management . Knowledge deficit related to basic understanding of how to use inhalers and how inhaled medications work . Knowledge deficit related to importance of energy conservation . Unable to independently manage COPD/pneumonia and recent diagnosis of lung nodule . Lacks social connections . Unable to perform IADLs independently . Does not contact provider office for questions/concerns  Case Manager Clinical Goal(s):  Over the next 120 days patient will report using inhalers as prescribed including rinsing mouth after use  Over the next 120 days, patient will be able to verbalize understanding of COPD action plan and when to seek appropriate levels of medical care  Over the next 120 days, patient will engage in lite exercise as tolerated to build/regain stamina and strength and reduce shortness of breath through activity tolerance  Over the next 120 days, patient will verbalize basic understanding of COPD disease process and self care  activities  Over the next 120 days, patient will not be hospitalized for COPD exacerbation as evidenced  Interventions:  . Collaboration with [name], DO regarding development and update of comprehensive plan of care as evidenced by provider attestation and co-signature . Inter-disciplinary care team collaboration (see longitudinal plan of care)  Provided patient with basic written and verbal COPD/Pneumonia/Lung nodule education on self care/management/and exacerbation prevention   Provided patient with COPD /Pneumonia/Lung nodule action plan and reinforced importance of daily self assessment  Provided written and verbal instructions on pursed lip breathing and utilized returned demonstration as teach back  Provided instruction about proper use of medications used for management of COPD including inhalers  Advised patient to self assesses COPD/Pneumonia/Lung nodule  action plan zone and make appointment with provider if in the yellow zone for 48 hours without improvement.  Provided patient with education about the role of exercise in the management of COPD  Advised patient to engage in light exercise as tolerated 3-5 days a week  Provided education about and advised patient to utilize infection prevention strategies to reduce risk of respiratory infection  Patient Goals/Self-Care Activities:  . - decision-making supported . - depression screen reviewed . - emotional support provided . - family involvement promoted . - problem-solving facilitated . - relaxation techniques promoted . - verbalization of feelings encouraged Follow Up Plan: Telephone follow up appointment with care management team member scheduled for: 06-15-2020 at 1:45 pm   Task: RNCM: Support Psychosocial Response to Chronic Obstructive Pulmonary Disease   Note:   Care Management Activities:    - decision-making supported - depression screen reviewed - emotional support provided - family involvement  promoted - problem-solving facilitated - relaxation techniques promoted - verbalization of feelings encouraged     Care Plan : RNCM; Fall Risk (Adult)  Updates made by [name] since 05/11/2020 12:00 AM    Problem: RNCM: Fall Risk   Priority: High    Goal: RNCM: Absence of Fall and Fall-Related Injury   Priority: High  Note:   Current Barriers:  [name] Knowledge Deficits related to fall precautions in patient with chronic pain, and several other chronic conditions impacting care  .  Decreased adherence to prescribed treatment for fall prevention . Unable to perform IADLs independently . Does not contact provider office for questions/concerns . Chronic Disease Management support and education needs related to increased falls risk in patient with multiple chronic conditions  Clinical Goal(s):  . patient will demonstrate improved adherence to prescribed treatment plan for decreasing falls as evidenced by patient reporting and review of EMR . patient will verbalize using fall risk reduction strategies discussed . patient will not experience additional falls . patient will verbalize understanding of plan for effective management of falls prevention and safety in the home Interventions:  . Collaboration with [name], DO regarding development and update of comprehensive plan of care as evidenced by provider attestation and co-signature . Inter-disciplinary care team collaboration (see longitudinal plan of care) . Provided written and verbal education re: Potential causes of falls and Fall prevention strategies . Reviewed medications and discussed potential side effects of medications such as dizziness and frequent urination . Assessed for s/s of orthostatic hypotension . Assessed for falls since last encounter. . Assessed patients knowledge of fall risk prevention secondary to previously provided education. . Assessed working status of life alert bracelet and patient  adherence . Provided patient information for fall alert systems . Evaluation of current treatment plan related to falls prevention and safety  and patient's adherence to plan as established by provider. . Advised patient to call the office for new falls/changes in chronic conditions or questions . Provided education to patient re: falls prevention and safety and monitoring for changes in conditions that may put patient at increase risk of falls . Provided patient with falls prevention  educational materials related to preventing falls and being safe in home environment  . Discussed plans with patient for ongoing care management follow up and provided patient with direct contact information for care management team Self-Care Deficits:  Lacks social connections Unable to perform IADLs independently Does not contact provider office for questions/concerns Patient Goals:  - Utilize walker and wheelchair (assistive device) appropriately with all ambulation - De-clutter walkways - Change positions slowly - Wear secure fitting shoes at all times with ambulation - Utilize home lighting for dim lit areas - Demonstrate self and pet awareness at all times - activities of daily living skills assessed - assistive or adaptive device use encouraged - barriers to physical activity or exercise addressed - barriers to physical activity or exercise identified - barriers to safety identified - cognition assessed - cognitive-stimulating activities promoted - fall prevention plan reviewed and updated - fear of falling, loss of independence and pain acknowledged Follow Up Plan: Telephone follow up appointment with care management team member scheduled for: 06-15-2020 at 1:45 pm   Task: RNCM: Identify and Manage Contributors to Fall Risk   Note:   Care Management Activities:    - activities of daily living skills assessed - assistive or adaptive device use encouraged - barriers to physical activity or  exercise addressed - barriers to physical activity or exercise identified - barriers to safety identified - cognition assessed - cognitive-stimulating activities promoted - fall prevention plan reviewed and updated - fear of falling, loss of independence and pain acknowledged         Plan:Telephone follow up appointment with care management team member scheduled for:  06-15-2020 at 1:45 pm  Noreene Larsson RN, MSN, [facility] Mobile: [phone]

## 2020-05-12 LAB — COMPREHENSIVE METABOLIC PANEL
ALT: 10 IU/L (ref 0–32)
AST: 15 IU/L (ref 0–40)
Albumin/Globulin Ratio: 1.6 (ref 1.2–2.2)
Albumin: 4.3 g/dL (ref 3.7–4.7)
Alkaline Phosphatase: 97 IU/L (ref 44–121)
BUN/Creatinine Ratio: 15 (ref 12–28)
BUN: 26 mg/dL (ref 8–27)
Bilirubin Total: 0.4 mg/dL (ref 0.0–1.2)
CO2: 22 mmol/L (ref 20–29)
Calcium: 9.2 mg/dL (ref 8.7–10.3)
Chloride: 102 mmol/L (ref 96–106)
Creatinine, Ser: 1.73 mg/dL — ABNORMAL HIGH (ref 0.57–1.00)
Globulin, Total: 2.7 g/dL (ref 1.5–4.5)
Glucose: 117 mg/dL — ABNORMAL HIGH (ref 65–99)
Potassium: 4.6 mmol/L (ref 3.5–5.2)
Sodium: 141 mmol/L (ref 134–144)
Total Protein: 7 g/dL (ref 6.0–8.5)
eGFR: 30 mL/min/{1.73_m2} — ABNORMAL LOW (ref >59)

## 2020-05-13 ENCOUNTER — Other Ambulatory Visit: Payer: Self-pay | Admitting: General Surgery

## 2020-05-13 DIAGNOSIS — C50312 Malignant neoplasm of lower-inner quadrant of left female breast: Secondary | ICD-10-CM

## 2020-05-13 LAB — URINE CULTURE

## 2020-05-14 ENCOUNTER — Encounter: Payer: Self-pay | Admitting: Family Medicine

## 2020-05-14 MED ORDER — TRESIBA FLEXTOUCH 100 UNIT/ML ~~LOC~~ SOPN: 24.0000 [IU] | PEN_INJECTOR | Freq: Every day | SUBCUTANEOUS | 1 refills | Status: DC

## 2020-05-14 MED ORDER — GLIPIZIDE 5 MG PO TABS: ORAL_TABLET | ORAL | 1 refills | Status: DC

## 2020-05-14 MED ORDER — METOPROLOL SUCCINATE ER 50 MG PO TB24
ORAL_TABLET | ORAL | 1 refills | Status: DC
Start: 2020-05-14 — End: 2020-10-08

## 2020-05-14 MED ORDER — PROAIR HFA 108 (90 BASE) MCG/ACT IN AERS: INHALATION_SPRAY | RESPIRATORY_TRACT | 3 refills | Status: DC

## 2020-05-14 MED ORDER — TRULICITY 4.5 MG/0.5ML ~~LOC~~ SOAJ: 4.5000 mg | SUBCUTANEOUS | 1 refills | Status: AC

## 2020-05-14 MED ORDER — MONTELUKAST SODIUM 10 MG PO TABS: 10.0000 mg | ORAL_TABLET | Freq: Every day | ORAL | 1 refills | Status: DC

## 2020-05-14 MED ORDER — SYNTHROID 150 MCG PO TABS: ORAL_TABLET | ORAL | 2 refills | Status: DC

## 2020-05-14 MED ORDER — PANTOPRAZOLE SODIUM 40 MG PO TBEC: 40.0000 mg | DELAYED_RELEASE_TABLET | Freq: Every day | ORAL | 1 refills | Status: DC

## 2020-05-14 NOTE — Assessment & Plan Note (Signed)
Increased with A1c of 7.7 up from 7.0. Will increase her trulicity to 4.5 and recheck 3 months. Call with any concerns.

## 2020-05-14 NOTE — Assessment & Plan Note (Signed)
Referral to ortho made today. Call with any concerns.

## 2020-05-14 NOTE — Assessment & Plan Note (Signed)
Under good control on current regimen. Continue current regimen. Continue to monitor. Call with any concerns. Refills given. Labs drawn today.   

## 2020-05-18 DIAGNOSIS — E1151 Type 2 diabetes mellitus with diabetic peripheral angiopathy without gangrene: Secondary | ICD-10-CM | POA: Diagnosis not present

## 2020-05-18 DIAGNOSIS — I130 Hypertensive heart and chronic kidney disease with heart failure and stage 1 through stage 4 chronic kidney disease, or unspecified chronic kidney disease: Secondary | ICD-10-CM | POA: Diagnosis not present

## 2020-05-18 DIAGNOSIS — E1122 Type 2 diabetes mellitus with diabetic chronic kidney disease: Secondary | ICD-10-CM | POA: Diagnosis not present

## 2020-05-18 DIAGNOSIS — N183 Chronic kidney disease, stage 3 unspecified: Secondary | ICD-10-CM | POA: Diagnosis not present

## 2020-05-18 DIAGNOSIS — I5032 Chronic diastolic (congestive) heart failure: Secondary | ICD-10-CM | POA: Diagnosis not present

## 2020-05-18 DIAGNOSIS — N390 Urinary tract infection, site not specified: Secondary | ICD-10-CM | POA: Diagnosis not present

## 2020-05-25 ENCOUNTER — Inpatient Hospital Stay: Payer: Medicare Other | Attending: Nurse Practitioner | Admitting: Nurse Practitioner

## 2020-05-25 DIAGNOSIS — Z87891 Personal history of nicotine dependence: Secondary | ICD-10-CM | POA: Insufficient documentation

## 2020-05-25 DIAGNOSIS — Z170 Estrogen receptor positive status [ER+]: Secondary | ICD-10-CM | POA: Insufficient documentation

## 2020-05-25 DIAGNOSIS — C50312 Malignant neoplasm of lower-inner quadrant of left female breast: Secondary | ICD-10-CM | POA: Insufficient documentation

## 2020-05-25 DIAGNOSIS — Z96651 Presence of right artificial knee joint: Secondary | ICD-10-CM | POA: Diagnosis not present

## 2020-05-25 DIAGNOSIS — R0781 Pleurodynia: Secondary | ICD-10-CM | POA: Diagnosis not present

## 2020-05-25 DIAGNOSIS — Z86711 Personal history of pulmonary embolism: Secondary | ICD-10-CM | POA: Diagnosis not present

## 2020-05-25 NOTE — Progress Notes (Signed)
Virtual Visit Progress Note  Symptom Management Clinic [facility]  [phone] [phone]  I connected with Tammy [name] on 05/25/20 at 11:00 AM EDT by video enabled telemedicine visit and verified that I am speaking with the correct person using two identifiers.   I discussed the limitations, risks, security and privacy concerns of performing an evaluation and management service by telemedicine and the availability of in-person appointments. I also discussed with the patient that there may be a patient responsible charge related to this service. The patient expressed understanding and agreed to proceed.   Other persons participating in the visit and their role in the encounter: none  Patient's location: home Provider's location: clinic    Patient Care Team: [name], DO as PCP - General (Family Medicine) [name], [name], MD as PCP - Cardiology (Cardiology) [name] [name], MD as Consulting Physician (Cardiology) [name], MD as Consulting Physician (Urology) [name], MD as Consulting Physician (Nephrology) [name], MD as Referring Physician (Orthopedic Surgery) Ronnald Collum, Lourdes Sledge, MD as Attending Physician (Endocrinology) [name], MD as Referring Physician (Vascular Surgery) [name], [facility] as Pharmacist (Pharmacist) [name], RN as Case Manager (General Practice)   Name of the patient: Tammy [name]  [id number]  02-18-1946   Date of visit: 05/25/20  Diagnosis- Breast Cancer  Chief complaint/ Reason for visit- Shortness of breath  Heme/Onc history:  Oncology History Overview Note   # July 2020 Left Breast- IMC; 14 mm; Grade: 2; Lymphovascular Invasion: Not identified;  pT1c pN0(sn); ER positive, PR positive, HER2 negative. [s/p lumpec; Dr.Byrnett]; rs/p RT [finished Sep, 9th 2020]  # Sep end 2020- START Arimidexa  # JAN 2022- 4mm/left upper lobe lung nodule; PET positive; right upper lobe  pneumonia/suspected reactive adenopathy.   # CKD-III/COPD/ CHF-compesated/PAD; CAD  DIAGNOSIS: Left breast cancer  STAGE: 1  ;GOALS: Cure  CURRENT/MOST RECENT THERAPY: arimidex   Malignant neoplasm of lower-inner quadrant of left female breast ([place]) (Resolved)  08/14/2018 Initial Diagnosis   Malignant neoplasm of lower-inner quadrant of left female breast [facility])   Carcinoma of lower-inner quadrant of left breast in female, estrogen receptor positive ([place])  10/02/2018 Initial Diagnosis   Carcinoma of lower-inner quadrant of left breast in female, estrogen receptor positive ([place])     Interval history- Patient is 75 year old female with above history of breast cancer who presents to symptom management clinic for follow-up for pleuritic pain and shortness of breath.  Patient is followed by the lung nodule clinic and underwent PET scan on 04/08/2020 which showed mild metabolic activity associated with enlarging left upper lobe subpleural pulmonary nodule, new hypermetabolic airspace disease in the right upper lobe consistent with pneumonia, hypermetabolic right paratracheal and right hilar lymph nodes, mild hypermetabolic tissue in the deep left breast. Based on the findings,  - Home covid test was negative.  - Treated with levaquin 500 mg x 7 days and medrol dosepak x 6 days  - Case discussed at tumor board w/ recommendation for ct scan in 3 months with follow up with Dr. Patsey Berthold following.  - 1 week later, pt complained of pleuritic type chest pain thought to be secondary to pneumonia. Treated with tylenol, 1 pill q6-8h for 2-3 days.   Returns today for re-evaluation of pleuritic pain. Has been using albuterol inhaler with improvement in shortness of breath. Uses twice a day. Not on other inhalers. SOB occurs primarily with exertion. No fevers or chills. Feels well otherwise.  Review of systems- Review of Systems  Constitutional: Negative for chills, fever, malaise/fatigue and weight loss.   HENT: Negative for hearing loss, nosebleeds, sore throat and tinnitus.   Eyes: Negative for blurred vision and double vision.  Respiratory: Positive for cough and shortness of breath. Negative for hemoptysis and wheezing.   Cardiovascular: Negative for chest pain, palpitations and leg swelling.  Gastrointestinal: Negative for abdominal pain, blood in stool, constipation, diarrhea, melena, nausea and vomiting.  Genitourinary: Negative for dysuria and urgency.  Musculoskeletal: Negative for back pain, falls, joint pain and myalgias.  Skin: Negative for itching and rash.  Neurological: Negative for dizziness, tingling, sensory change, loss of consciousness, weakness and headaches.  Endo/Heme/Allergies: Negative for environmental allergies. Does not bruise/bleed easily.  Psychiatric/Behavioral: Negative for depression. The patient is not nervous/anxious and does not have insomnia.       Allergies  Allergen Reactions  . Hydrocodone Other (See Comments)    Unresponsive  . Aleve [Naproxen Sodium]     Hives & itching   . Contrast Media [Iodinated Diagnostic Agents]     Pt avoids due to kidney issues  . Ioxaglate Other (See Comments)    (iodine) Pt avoids due to kidney issues   . Oxycodone Other (See Comments)    hallucinations  . Ranexa [Ranolazine]     Sick on stomach  . Sulfa Antibiotics Other (See Comments)    Pt avoids due to kidney issues  . Tramadol Other (See Comments)    nightmares    Past Medical History:  Diagnosis Date  . Asthma   . Benign neoplasm of colon   . Breast cancer ([place]) 08/2018   left breast  . Cervical disc disease    "bulging" - no limitations per pt  . Chronic diastolic CHF (congestive heart failure) ([place])    a. 07/2012 Echo: Nl EF. mild inferoseptal HK, Gr 2 DD, mild conc LVH, mild PR/TR, mild PAH.  . CKD (chronic kidney disease), stage III ([place])   . COPD (chronic obstructive pulmonary disease) ([place])   . Coronary artery disease    a. 11/2011 Cath:  LAD 69md, LCX min irregs, RCA 70p, 1066mith L->R collats, EF 60%-->Med Rx.  . Diabetes mellitus without complication ([place]  . Fusion of lumbar spine 12/22/2015  . GERD (gastroesophageal reflux disease)   . History of DVT (deep vein thrombosis)   . History of tobacco abuse    a. Quit 2011.  . Marland Kitchenyperlipidemia   . Hypertension   . Hypothyroidism   . Obesity   . Palpitations   . PE (pulmonary embolism) 10/15  . Personal history of radiation therapy   . PONV (postoperative nausea and vomiting)   . Pulmonary embolus ([place]/15/2015   RLL. Presented with SOB and elevated d-dimer.   . Marland KitchenVD (peripheral vascular disease) ([place]   a. PTA of right leg and left femoral artery with stenosis.  . Renal insufficiency   . [facility]   TIAs - no deficits    Past Surgical History:  Procedure Laterality Date  . ANGIOPLASTY / STENTING FEMORAL     PVD; angioplasty right leg and left femoral artery with stenosis.   . APPENDECTOMY  Oct. 2016  . BACK SURGERY  03/2012   nerve stimulator inserted  . BACK SURGERY     screws, rods replaced with new hardware.  . Marland KitchenACK SURGERY  11/2016  . BREAST LUMPECTOMY Left 08/26/2018  . BREAST SURGERY    . CARDIAC CATHETERIZATION  12/07/2011  Mid LAD 50%, distal LAD 50%, mid RCA 70%, distal RCA 100%   . CARDIAC CATHETERIZATION  01/04/2011   100% occluded mid RCA with good collaterals from distal LAD, normal LVEF.  [name] CHOLECYSTECTOMY    . COLONOSCOPY  2013  . COLONOSCOPY WITH PROPOFOL N/A 05/24/2015   Procedure: COLONOSCOPY WITH PROPOFOL;  Surgeon: [name], MD;  Location: [facility];  Service: Endoscopy;  Laterality: N/A;  Diabetic - oral meds  . COLONOSCOPY WITH PROPOFOL N/A 01/28/2019   Procedure: COLONOSCOPY WITH PROPOFOL;  Surgeon: [name], MD;  Location: [facility];  Service: Endoscopy;  Laterality: N/A;  . DIAGNOSTIC LAPAROSCOPY    . ESOPHAGOGASTRODUODENOSCOPY (EGD) WITH PROPOFOL N/A 01/28/2019   Procedure:  ESOPHAGOGASTRODUODENOSCOPY (EGD) WITH PROPOFOL;  Surgeon: [name], MD;  Location: [facility] ENDOSCOPY;  Service: Endoscopy;  Laterality: N/A;  . LAPAROSCOPIC APPENDECTOMY N/A 01/06/2015   Procedure: APPENDECTOMY LAPAROSCOPIC;  Surgeon: [name], MD;  Location: [facility] ORS;  Service: General;  Laterality: N/A;  . LEFT HEART CATH AND CORONARY ANGIOGRAPHY Left 04/04/2019   Procedure: LEFT HEART CATH AND CORONARY ANGIOGRAPHY;  Surgeon: [name], MD;  Location: [facility];  Service: Cardiovascular;  Laterality: Left;  [name] MASTECTOMY    . MASTECTOMY W/ SENTINEL NODE BIOPSY Left 08/26/2018   Procedure: PARTIAL MASTECTOMY WIDE EXCISION WITH SENTINEL LYMPH NODE BIOPSY LEFT;  Surgeon: [name], MD;  Location: [facility] ORS;  Service: General;  Laterality: Left;  . POLYPECTOMY  05/24/2015   Procedure: POLYPECTOMY;  Surgeon: [name], MD;  Location: [facility];  Service: Endoscopy;;  . THYROIDECTOMY    . TOTAL KNEE ARTHROPLASTY Right 11/13/2019  . TRIGGER FINGER RELEASE      Social History   Socioeconomic History  . Marital status: Married    Spouse name: Not on file  . Number of children: 2  . Years of education: some college, Production assistant, radio   . Highest education level: Associate degree: occupational, Hotel manager, or vocational program  Occupational History  . Occupation: Retired  Tobacco Use  . Smoking status: Former Smoker    Packs/day: 0.50    Years: 15.00    Pack years: 7.50    Types: Cigarettes    Quit date: 03/14/2007    Years since quitting: 13.2  . Smokeless tobacco: Never Used  Vaping Use  . Vaping Use: Never used  Substance and Sexual Activity  . Alcohol use: No    Alcohol/week: 0.0 standard drinks  . Drug use: Never  . Sexual activity: Yes    Birth control/protection: Post-menopausal  Other Topics Concern  . Not on file  Social History Narrative   Lives at home with husband in [place]. Quit  smoking 10 years ago; never alcohol. 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    Social Determinants of Health   Financial Resource Strain: Low Risk   . Difficulty of Paying Living Expenses: Not hard at all  Food Insecurity: No Food Insecurity  . Worried About Charity fundraiser in the Last Year: Never true  . Ran Out of Food in the Last Year: Never true  Transportation Needs: No Transportation Needs  . Lack of Transportation (Medical): No  . Lack of Transportation (Non-Medical): No  Physical Activity: Sufficiently Active  . Days of Exercise per Week: 7 days  . Minutes of Exercise per Session: 60 min  Stress: No Stress Concern Present  . Feeling of  Stress : Not at all  Social Connections: Socially Integrated  . Frequency of Communication with Friends and Family: More than three times a week  . Frequency of Social Gatherings with Friends and Family: More than three times a week  . Attends Religious Services: More than 4 times per year  . Active Member of Clubs or Organizations: Yes  . Attends Archivist Meetings: 1 to 4 times per year  . Marital Status: Married  Human resources officer Violence: Unknown  . Fear of Current or Ex-Partner: No  . Emotionally Abused: No  . Physically Abused: No  . Sexually Abused: Not on file    Family History  Problem Relation Age of Onset  . Hypertension Father   . Heart disease Father   . Heart attack Father   . Diabetes Father   . Cancer Mother        colon  . Heart attack Brother   . Diabetes Brother   . Hypertension Brother   . Heart disease Brother   . Heart attack Brother   . Diabetes Brother   . Hypertension Brother   . Heart disease Brother   . Heart attack Brother   . Diabetes Brother   . Hypertension Brother   . Heart disease Brother   . Cancer Sister        lung  . COPD Sister   . COPD Sister   . Breast cancer Neg Hx      Current Outpatient Medications:  .  anastrozole (ARIMIDEX) 1  MG tablet, Take 1 tablet (1 mg total) by mouth daily., Disp: 30 tablet, Rfl: 4 .  blood glucose meter kit and supplies KIT, Dispense based on patient and insurance preference. Use one time daily as directed, E11.9, Disp: 1 each, Rfl: 0 .  Calcium Carb-Cholecalciferol (CALCIUM 600 + D) 600-200 MG-UNIT TABS, Take 1 tablet by mouth daily. , Disp: , Rfl:  .  cetirizine (ZYRTEC) 10 MG tablet, TAKE 1 TABLET BY MOUTH DAILY, Disp: 30 tablet, Rfl: 0 .  cholecalciferol (VITAMIN D3) 25 MCG (1000 UT) tablet, Take 1,000 Units by mouth daily., Disp: , Rfl:  .  Dulaglutide (TRULICITY) 4.5 YB/0.1BP SOPN, Inject 4.5 mg as directed once a week., Disp: 6 mL, Rfl: 1 .  DULoxetine (CYMBALTA) 60 MG capsule, TAKE 1 CAPSULE BY MOUTH DAILY (Patient taking differently: Take 60 mg by mouth daily.), Disp: 90 capsule, Rfl: 1 .  ELIQUIS 2.5 MG TABS tablet, TAKE 1 TABLET BY MOUTH TWICE DAILY (Patient taking differently: Take 2.5 mg by mouth 2 (two) times daily.), Disp: 180 tablet, Rfl: 1 .  gabapentin (NEURONTIN) 300 MG capsule, Take 1 capsule (300 mg total) by mouth 3 (three) times daily., Disp: 270 capsule, Rfl: 3 .  glipiZIDE (GLUCOTROL) 5 MG tablet, TAKE ONE TABLET BY MOUTH EVERY DAY BEFORE BREAKFAST, Disp: 90 tablet, Rfl: 1 .  insulin degludec (TRESIBA FLEXTOUCH) 100 UNIT/ML FlexTouch Pen, Inject 24 Units into the skin daily., Disp: 12 mL, Rfl: 1 .  Insulin Pen Needle (PEN NEEDLES 3/16") 31G X 5 MM MISC, 1 each by Does not apply route daily., Disp: 100 each, Rfl: 12 .  isosorbide mononitrate (IMDUR) 60 MG 24 hr tablet, TAKE ONE TABLET BY MOUTH AT BEDTIME AND HALF A TABLET EVERY MORNING (Patient taking differently: Take 30-60 mg by mouth See admin instructions. Take  tablet ($Remove'30mg'WuCTHyk$ ) by mouth every morning and take 1 tablet ($RemoveB'60mg'NlScDbOT$ ) by mouth every night), Disp: 135 tablet, Rfl: 3 .  JARDIANCE 25 MG TABS  tablet, TAKE ONE TABLET $RemoveBe'25MG'oFBKkneAh$  EVERY DAY BEFORE BREAKFAST (Patient taking differently: Take 25 mg by mouth daily before breakfast.),  Disp: 90 tablet, Rfl: 1 .  Meth-Hyo-M Bl-Na Phos-Ph Sal (URO-MP) 118 MG CAPS, TAKE 1 CAPSULE BY MOUTH 3 TIMES DAILY ASNEEDED ( URINARY FREQUENCY,URGENCY, BURNING. (Patient taking differently: Take 1 capsule by mouth 3 (three) times daily as needed (urinary frequency, urgency or burning).), Disp: 90 capsule, Rfl: 1 .  metoprolol succinate (TOPROL-XL) 50 MG 24 hr tablet, TAKE ONE TABLET EVERY DAY WITH OR IMMEDIATELY FOLLOWING A MEAL, Disp: 90 tablet, Rfl: 1 .  mometasone (ELOCON) 0.1 % lotion, Apply topically as directed. Qd up to 5 days a week to aa scalp until clear, then prn flares (Patient taking differently: Apply 1 application topically daily as needed (scalp irritation).), Disp: 60 mL, Rfl: 1 .  montelukast (SINGULAIR) 10 MG tablet, Take 1 tablet (10 mg total) by mouth at bedtime., Disp: 90 tablet, Rfl: 1 .  MYRBETRIQ 50 MG TB24 tablet, TAKE 1 TABLET BY MOUTH DAILY, Disp: 30 tablet, Rfl: 2 .  nitroGLYCERIN (NITROSTAT) 0.4 MG SL tablet, Place 1 tablet (0.4 mg total) under the tongue every 5 (five) minutes as needed for chest pain., Disp: 25 tablet, Rfl: 6 .  pantoprazole (PROTONIX) 40 MG tablet, Take 1 tablet (40 mg total) by mouth daily., Disp: 90 tablet, Rfl: 1 .  Polyethylene Glycol 400 (BLINK TEARS) 0.25 % SOLN, Place 1 drop into both eyes 3 (three) times daily as needed (dry/irritated eyes.). , Disp: , Rfl:  .  PROAIR HFA 108 (90 Base) MCG/ACT inhaler, TAKE 2 PUFFS EVERY 4 HOURS AS NEEDED FORWHEEZING OR SHORTNESS OF BREATH, Disp: 8.5 g, Rfl: 3 .  rosuvastatin (CRESTOR) 40 MG tablet, TAKE 1 TABLET BY MOUTH ONCE DAILY (Patient taking differently: Take 40 mg by mouth daily.), Disp: 90 tablet, Rfl: 1 .  senna (SENOKOT) 8.6 MG TABS tablet, Take 8.6 mg by mouth daily as needed for mild constipation., Disp: , Rfl:  .  SYNTHROID 150 MCG tablet, TAKE 1 TABLET EVERY DAY ON EMPTY STOMACHWITH A GLASS OF WATER AT LEAST 30-60 MINBEFORE BREAKFAST, Disp: 30 tablet, Rfl: 2 .  trimethoprim (TRIMPEX) 100 MG  tablet, TAKE ONE TABLET BY MOUTH EVERY DAY (Patient taking differently: Take 100 mg by mouth daily.), Disp: 90 tablet, Rfl: 1  Physical exam: Exam limited due to telemedicine  There were no vitals filed for this visit. Physical Exam Constitutional:      General: She is not in acute distress. HENT:     Head: Normocephalic.  Pulmonary:     Effort: No respiratory distress.  Neurological:     Mental Status: She is alert and oriented to person, place, and time.  Psychiatric:        Mood and Affect: Mood normal.        Behavior: Behavior normal.      CMP Latest Ref Rng & Units 05/11/2020  Glucose 65 - 99 mg/dL 117(H)  BUN 8 - 27 mg/dL 26  Creatinine 0.57 - 1.00 mg/dL 1.73(H)  Sodium 134 - 144 mmol/L 141  Potassium 3.5 - 5.2 mmol/L 4.6  Chloride 96 - 106 mmol/L 102  CO2 20 - 29 mmol/L 22  Calcium 8.7 - 10.3 mg/dL 9.2  Total Protein 6.0 - 8.5 g/dL 7.0  Total Bilirubin 0.0 - 1.2 mg/dL 0.4  Alkaline Phos 44 - 121 IU/L 97  AST 0 - 40 IU/L 15  ALT 0 - 32 IU/L 10   CBC Latest Ref  Rng & Units 05/11/2020  WBC 3.4 - 10.8 x10E3/uL 11.4(H)  Hemoglobin 11.1 - 15.9 g/dL 13.1  Hematocrit 34.0 - 46.6 % 43.0  Platelets 150 - 450 x10E3/uL 352    No images are attached to the encounter.  No results found.  Assessment and plan- Patient is a 75 y.o. female with history of breast cancer who presents to symptom management clinic for follow-up for pleuritic pain which is thought to be secondary to recent pneumonia.  Possible COPD and her history.  Per patient, not followed by pulmonology.  She is receiving scheduled Tylenol and albuterol inhaler and feels her symptoms are slightly improved.  She would like to follow-up with pulmonology for further evaluation.  Consider PFTs to evaluate for restrictive airway.  Again recommended physical therapy outpatient.  Return to clinic if symptoms do not improve or worsen.  Follow-up with medical oncology as scheduled.  Visit Diagnosis No diagnosis  found.  Patient expressed understanding and was in agreement with this plan. She also understands that She can call clinic at any time with any questions, concerns, or complaints.   I discussed the assessment and treatment plan with the patient. The patient was provided an opportunity to ask questions and all were answered. The patient agreed with the plan and demonstrated an understanding of the instructions.   The patient was advised to call back or seek an in-person evaluation if the symptoms worsen or if the condition fails to improve as anticipated.   I spent 20 minutes face-to-face video visit time dedicated to the care of this patient on the date of this encounter to include pre-visit review of hospital records, face-to-face time with the patient, and post visit ordering of testing/documentation.   Thank you for allowing me to participate in the care of this very pleasant patient.   Beckey Rutter, DNP, AGNP-[facility]

## 2020-05-27 DIAGNOSIS — I129 Hypertensive chronic kidney disease with stage 1 through stage 4 chronic kidney disease, or unspecified chronic kidney disease: Secondary | ICD-10-CM | POA: Diagnosis not present

## 2020-05-27 DIAGNOSIS — E875 Hyperkalemia: Secondary | ICD-10-CM | POA: Diagnosis not present

## 2020-05-27 DIAGNOSIS — E1122 Type 2 diabetes mellitus with diabetic chronic kidney disease: Secondary | ICD-10-CM | POA: Diagnosis not present

## 2020-05-27 DIAGNOSIS — N1832 Chronic kidney disease, stage 3b: Secondary | ICD-10-CM | POA: Diagnosis not present

## 2020-05-27 DIAGNOSIS — R809 Proteinuria, unspecified: Secondary | ICD-10-CM | POA: Diagnosis not present

## 2020-05-31 ENCOUNTER — Other Ambulatory Visit: Payer: Self-pay | Admitting: Family Medicine

## 2020-06-02 ENCOUNTER — Other Ambulatory Visit: Payer: Self-pay | Admitting: Family Medicine

## 2020-06-02 ENCOUNTER — Telehealth: Payer: Self-pay

## 2020-06-02 NOTE — Telephone Encounter (Signed)
   Notes to clinic: Patient states that she is taking 24 units instead of 20 units  Review for refills   Requested Prescriptions  Pending Prescriptions Disp Refills   TRESIBA FLEXTOUCH 100 UNIT/ML FlexTouch Pen [Pharmacy Med Name: TRESIBA FLEXTOUCH 100 UNIT/ML SUBQ] 30 mL     Sig: INJECT 0.2 MLS (20 UNITS TOTAL) INTO THESKIN DAILY      Endocrinology:  Diabetes - Insulins Passed - 06/02/2020 10:29 AM      Passed - HBA1C is between 0 and 7.9 and within 180 days    Hemoglobin A1C  Date Value Ref Range Status  01/31/2016 8.7  Final   HB A1C (BAYER DCA - WAIVED)  Date Value Ref Range Status  05/11/2020 7.7 (H) <7.0 % Final    Comment:                                          Diabetic Adult            <7.0                                       Healthy Adult        4.3 - 5.7                                                           (DCCT/NGSP) American Diabetes Association's Summary of Glycemic Recommendations for Adults with Diabetes: Hemoglobin A1c <7.0%. More stringent glycemic goals (A1c <6.0%) may further reduce complications at the cost of increased risk of hypoglycemia.           Passed - Valid encounter within last 6 months    Recent Outpatient Visits           3 weeks ago Type 2 diabetes mellitus with stage 3 chronic kidney disease, with long-term current use of insulin, unspecified whether stage 3a or 3b CKD ([place])   [place], Megan P, DO   3 months ago Type 2 diabetes mellitus with stage 3 chronic kidney disease, with long-term current use of insulin, unspecified whether stage 3a or 3b CKD ([place])   [place], Megan P, DO   6 months ago Wellness examination   [employer], Megan P, DO   9 months ago Uncontrolled type 2 diabetes mellitus with complication, without long-term current use of insulin ([place])   [place], Megan P, DO   10 months ago Uncontrolled type 2 diabetes mellitus with  complication, without long-term current use of insulin ([place])   [place], Megan P, DO       Future Appointments             In 6 months Stoioff, Ronda Fairly, MD [place]   In 6 months  [facility], [facility]

## 2020-06-02 NOTE — Telephone Encounter (Signed)
Copied from [place] [id number]. Topic: General - Other >> Jun 02, 2020  2:52 PM Pawlus, Brayton Layman A wrote: Reason for CRM: Pt called in regarding her insulin degludec (TRESIBA FLEXTOUCH) 100 UNIT/ML FlexTouch Pen, Pt stated it is Urgent she gets this because she will run out after today.  Pt stated the pharmacy was still waiting for Doctor approval.  Please advise.

## 2020-06-02 NOTE — Telephone Encounter (Signed)
Routing to provider  

## 2020-06-02 NOTE — Telephone Encounter (Signed)
Medication called in 

## 2020-06-03 ENCOUNTER — Ambulatory Visit: Payer: Self-pay | Admitting: Urology

## 2020-06-05 ENCOUNTER — Other Ambulatory Visit: Payer: Self-pay | Admitting: Family Medicine

## 2020-06-05 NOTE — Telephone Encounter (Signed)
Requested medication (s) are due for refill today  Per chart records patient should have medication to last thru 09/04/20.  Requested medication (s) are on the active medication list  Yes  Future visit scheduled   Yes, 06/15/20 and last OV was 05/20/20.  Note to clinic-medication not delegated for [facility] to refill.    Requested Prescriptions  Pending Prescriptions Disp Refills   trimethoprim (TRIMPEX) 100 MG tablet [Pharmacy Med Name: TRIMETHOPRIM 100 MG TAB] 90 tablet 1    Sig: TAKE ONE TABLET BY MOUTH EVERY DAY      Off-Protocol Failed - 06/05/2020 10:37 AM      Failed - Medication not assigned to a protocol, review manually.      Passed - Valid encounter within last 12 months    Recent Outpatient Visits           3 weeks ago Type 2 diabetes mellitus with stage 3 chronic kidney disease, with long-term current use of insulin, unspecified whether stage 3a or 3b CKD ([place])   [place], Megan P, DO   3 months ago Type 2 diabetes mellitus with stage 3 chronic kidney disease, with long-term current use of insulin, unspecified whether stage 3a or 3b CKD ([place])   [place], Megan P, DO   6 months ago Wellness examination   [employer], Megan P, DO   9 months ago Uncontrolled type 2 diabetes mellitus with complication, without long-term current use of insulin ([place])   [place], Megan P, DO   10 months ago Uncontrolled type 2 diabetes mellitus with complication, without long-term current use of insulin ([place])   [place], Megan P, DO       Future Appointments             In 6 months Stoioff, Ronda Fairly, MD [place]   In 6 months  [facility], [facility]

## 2020-06-15 ENCOUNTER — Telehealth: Payer: Self-pay | Admitting: General Practice

## 2020-06-15 ENCOUNTER — Telehealth: Payer: Self-pay

## 2020-06-15 DIAGNOSIS — E538 Deficiency of other specified B group vitamins: Secondary | ICD-10-CM | POA: Diagnosis not present

## 2020-06-15 DIAGNOSIS — R413 Other amnesia: Secondary | ICD-10-CM | POA: Diagnosis not present

## 2020-06-15 DIAGNOSIS — H532 Diplopia: Secondary | ICD-10-CM | POA: Diagnosis not present

## 2020-06-15 DIAGNOSIS — E559 Vitamin D deficiency, unspecified: Secondary | ICD-10-CM | POA: Diagnosis not present

## 2020-06-15 DIAGNOSIS — E519 Thiamine deficiency, unspecified: Secondary | ICD-10-CM | POA: Diagnosis not present

## 2020-06-15 NOTE — Telephone Encounter (Signed)
  Chronic Care Management   Outreach Note  06/15/2020 Name: CHENE KASINGER MRN: [id number] DOB: February 08, 1946  Referred by: [name], DO Reason for referral : Appointment (RNCM: Follow up call for Chronic Disease Management and Care Coordination Needs)   An unsuccessful telephone outreach was attempted today. The patient was referred to the case management team for assistance with care management and care coordination.   Follow Up Plan: Telephone follow up appointment with care management team member scheduled for: 07-27-2020 at 1 pm.  Noreene Larsson RN, MSN, [facility] Mobile: [phone]

## 2020-06-24 ENCOUNTER — Ambulatory Visit
Admission: RE | Admit: 2020-06-24 | Discharge: 2020-06-24 | Disposition: A | Discharge status: Home / Self Care | Payer: Medicare Other | Source: Ambulatory Visit | Attending: Radiation Oncology | Admitting: Radiation Oncology

## 2020-06-24 ENCOUNTER — Encounter: Payer: Self-pay | Admitting: Radiation Oncology

## 2020-06-24 DIAGNOSIS — Z170 Estrogen receptor positive status [ER+]: Secondary | ICD-10-CM | POA: Insufficient documentation

## 2020-06-24 DIAGNOSIS — C50312 Malignant neoplasm of lower-inner quadrant of left female breast: Secondary | ICD-10-CM | POA: Diagnosis not present

## 2020-06-24 DIAGNOSIS — Z923 Personal history of irradiation: Secondary | ICD-10-CM | POA: Diagnosis not present

## 2020-06-24 DIAGNOSIS — R911 Solitary pulmonary nodule: Secondary | ICD-10-CM | POA: Diagnosis not present

## 2020-06-24 DIAGNOSIS — Z08 Encounter for follow-up examination after completed treatment for malignant neoplasm: Secondary | ICD-10-CM | POA: Diagnosis not present

## 2020-06-24 NOTE — Progress Notes (Signed)
Radiation Oncology Follow up Note  Name: Tammy [name]   Date:   06/24/2020 MRN:  [id number] DOB: September 29, 1945    This 75 y.o. female presents to the clinic today for 1/2-year follow-up status post whole breast radiation to her left breast for stage I ER/PR positive invasive mammary carcinoma.  REFERRING PROVIDER: [name], DO  HPI: Patient is a 75 year old female now about a year and a half having completed whole breast radiation to her left breast for stage I ER/PR positive invasive mammary comes carcinoma.  Seen today in routine follow-up she is doing well.  She specifically denies breast tenderness cough or bone pain..  Back last November she had some aspiration of left breast seroma and she has follow-up mammograms in May.  CT scan of her chest back in January showed slight interval enlargement of a peripheral left upper lobe nodule.  PET/CT demonstrated mild metabolic activity associated with this mass.  She also hypermetabolic airspace disease in the right upper lobe consistent with pneumonia.  She has a repeat of her CT scans ultrasound of her breast as well as mammograms scheduled for May.  COMPLICATIONS OF TREATMENT: none  FOLLOW UP COMPLIANCE: keeps appointments   PHYSICAL EXAM:  BP (!) (P) 152/64 (BP Location: Left Arm)   Pulse (P) 91   Temp (P) 97.9 F (36.6 C) (Tympanic)   Wt (P) 212 lb 11.2 oz (96.5 kg)   BMI (P) 34.33 kg/m  Lungs are clear to A&P cardiac examination essentially unremarkable with regular rate and rhythm. No dominant mass or nodularity is noted in either breast in 2 positions examined. Incision is well-healed. No axillary or supraclavicular adenopathy is appreciated. Cosmetic result is excellent.  Well-developed well-nourished patient in NAD. HEENT reveals PERLA, EOMI, discs not visualized.  Oral cavity is clear. No oral mucosal lesions are identified. Neck is clear without evidence of cervical or supraclavicular adenopathy. Lungs are clear to A&P. Cardiac  examination is essentially unremarkable with regular rate and rhythm without murmur rub or thrill. Abdomen is benign with no organomegaly or masses noted. Motor sensory and DTR levels are equal and symmetric in the upper and lower extremities. Cranial nerves II through XII are grossly intact. Proprioception is intact. No peripheral adenopathy or edema is identified. No motor or sensory levels are noted. Crude visual fields are within normal range.  RADIOLOGY RESULTS: CT scans PET CT scans mammograms ultrasounds all reviewed  PLAN: Present time like to review all of her new imaging in May/set up a follow-up appointment after the imaging in about a month's time.  Should this be significant enlargement of her pulmonary nodule may consider SBRT.  We will consult with medical oncology about biopsy at that time.  Patient comprehends my recommendations well.  I would like to take this opportunity to thank you for allowing me to participate in the care of your patient.[name], MD

## 2020-07-14 ENCOUNTER — Ambulatory Visit
Admission: RE | Admit: 2020-07-14 | Discharge: 2020-07-14 | Disposition: A | Discharge status: Home / Self Care | Payer: Medicare Other | Source: Ambulatory Visit | Attending: Internal Medicine | Admitting: Internal Medicine

## 2020-07-14 ENCOUNTER — Other Ambulatory Visit: Payer: Self-pay

## 2020-07-14 DIAGNOSIS — R911 Solitary pulmonary nodule: Secondary | ICD-10-CM | POA: Diagnosis not present

## 2020-07-14 DIAGNOSIS — I77810 Thoracic aortic ectasia: Secondary | ICD-10-CM | POA: Diagnosis not present

## 2020-07-14 DIAGNOSIS — I2510 Atherosclerotic heart disease of native coronary artery without angina pectoris: Secondary | ICD-10-CM | POA: Diagnosis not present

## 2020-07-14 DIAGNOSIS — J432 Centrilobular emphysema: Secondary | ICD-10-CM | POA: Diagnosis not present

## 2020-07-14 DIAGNOSIS — J984 Other disorders of lung: Secondary | ICD-10-CM | POA: Diagnosis not present

## 2020-07-16 ENCOUNTER — Ambulatory Visit
Admission: RE | Admit: 2020-07-16 | Discharge: 2020-07-16 | Disposition: A | Discharge status: Home / Self Care | Payer: Medicare Other | Source: Ambulatory Visit | Attending: General Surgery | Admitting: General Surgery

## 2020-07-16 ENCOUNTER — Other Ambulatory Visit: Payer: Self-pay

## 2020-07-16 DIAGNOSIS — Z170 Estrogen receptor positive status [ER+]: Secondary | ICD-10-CM

## 2020-07-16 DIAGNOSIS — C50312 Malignant neoplasm of lower-inner quadrant of left female breast: Secondary | ICD-10-CM

## 2020-07-16 DIAGNOSIS — R922 Inconclusive mammogram: Secondary | ICD-10-CM | POA: Diagnosis not present

## 2020-07-19 ENCOUNTER — Other Ambulatory Visit: Payer: Self-pay | Admitting: *Deleted

## 2020-07-19 ENCOUNTER — Inpatient Hospital Stay (HOSPITAL_BASED_OUTPATIENT_CLINIC_OR_DEPARTMENT_OTHER): Payer: Medicare Other | Admitting: Internal Medicine

## 2020-07-19 ENCOUNTER — Ambulatory Visit
Admission: RE | Admit: 2020-07-19 | Discharge: 2020-07-19 | Disposition: A | Discharge status: Home / Self Care | Payer: Medicare Other | Source: Ambulatory Visit | Attending: Radiation Oncology | Admitting: Radiation Oncology

## 2020-07-19 ENCOUNTER — Encounter: Payer: Self-pay | Admitting: Internal Medicine

## 2020-07-19 ENCOUNTER — Inpatient Hospital Stay: Payer: Medicare Other | Attending: Internal Medicine

## 2020-07-19 ENCOUNTER — Other Ambulatory Visit: Payer: Self-pay

## 2020-07-19 DIAGNOSIS — C50312 Malignant neoplasm of lower-inner quadrant of left female breast: Secondary | ICD-10-CM

## 2020-07-19 DIAGNOSIS — Z801 Family history of malignant neoplasm of trachea, bronchus and lung: Secondary | ICD-10-CM | POA: Diagnosis not present

## 2020-07-19 DIAGNOSIS — Z170 Estrogen receptor positive status [ER+]: Secondary | ICD-10-CM | POA: Diagnosis not present

## 2020-07-19 DIAGNOSIS — N183 Chronic kidney disease, stage 3 unspecified: Secondary | ICD-10-CM | POA: Diagnosis not present

## 2020-07-19 DIAGNOSIS — C3412 Malignant neoplasm of upper lobe, left bronchus or lung: Secondary | ICD-10-CM

## 2020-07-19 DIAGNOSIS — Z86718 Personal history of other venous thrombosis and embolism: Secondary | ICD-10-CM | POA: Diagnosis not present

## 2020-07-19 DIAGNOSIS — Z86711 Personal history of pulmonary embolism: Secondary | ICD-10-CM | POA: Insufficient documentation

## 2020-07-19 DIAGNOSIS — Z87891 Personal history of nicotine dependence: Secondary | ICD-10-CM | POA: Diagnosis not present

## 2020-07-19 DIAGNOSIS — Z9012 Acquired absence of left breast and nipple: Secondary | ICD-10-CM | POA: Insufficient documentation

## 2020-07-19 LAB — CBC WITH DIFFERENTIAL/PLATELET
Abs Immature Granulocytes: 0.06 10*3/uL (ref 0.00–0.07)
Basophils Absolute: 0.1 10*3/uL (ref 0.0–0.1)
Basophils Relative: 1 %
Eosinophils Absolute: 0.2 10*3/uL (ref 0.0–0.5)
Eosinophils Relative: 3 %
HCT: 41 % (ref 36.0–46.0)
Hemoglobin: 12.7 g/dL (ref 12.0–15.0)
Immature Granulocytes: 1 %
Lymphocytes Relative: 23 %
Lymphs Abs: 2.1 10*3/uL (ref 0.7–4.0)
MCH: 26.8 pg (ref 26.0–34.0)
MCHC: 31 g/dL (ref 30.0–36.0)
MCV: 86.7 fL (ref 80.0–100.0)
Monocytes Absolute: 1 10*3/uL (ref 0.1–1.0)
Monocytes Relative: 12 %
Neutro Abs: 5.4 10*3/uL (ref 1.7–7.7)
Neutrophils Relative %: 60 %
Platelets: 285 10*3/uL (ref 150–400)
RBC: 4.73 MIL/uL (ref 3.87–5.11)
RDW: 15 % (ref 11.5–15.5)
WBC: 8.9 10*3/uL (ref 4.0–10.5)
nRBC: 0 % (ref 0.0–0.2)

## 2020-07-19 LAB — COMPREHENSIVE METABOLIC PANEL
ALT: 11 U/L (ref 0–44)
AST: 14 U/L — ABNORMAL LOW (ref 15–41)
Albumin: 3.6 g/dL (ref 3.5–5.0)
Alkaline Phosphatase: 75 U/L (ref 38–126)
Anion gap: 9 (ref 5–15)
BUN: 34 mg/dL — ABNORMAL HIGH (ref 8–23)
CO2: 23 mmol/L (ref 22–32)
Calcium: 9 mg/dL (ref 8.9–10.3)
Chloride: 104 mmol/L (ref 98–111)
Creatinine, Ser: 1.73 mg/dL — ABNORMAL HIGH (ref 0.44–1.00)
GFR, Estimated: 30 mL/min — ABNORMAL LOW (ref >60)
Glucose, Bld: 173 mg/dL — ABNORMAL HIGH (ref 70–99)
Potassium: 4.9 mmol/L (ref 3.5–5.1)
Sodium: 136 mmol/L (ref 135–145)
Total Bilirubin: 0.8 mg/dL (ref 0.3–1.2)
Total Protein: 7 g/dL (ref 6.5–8.1)

## 2020-07-19 NOTE — Progress Notes (Signed)
Radiation Oncology Follow up Note  Name: Tammy [name]   Date:   07/19/2020 MRN:  [id number] DOB: 07-22-1945    This 75 y.o. female presents to the clinic today for follow-up of left upper lobe nodule which has been progressing in patient status post whole breast radiation to her left breast for stage I ER/PR positive invasive mammary carcinoma.  REFERRING PROVIDER: [name], DO  HPI: Patient is a 75 year old female who has completed whole breast radiation to her left breast for stage I ER/PR positive invasive mammary carcinoma we have been tracking a nodule in her left upper lobe that is PET positive.  And recently on repeat CT scan the nodule is slowly growing consistent with early stage non-small cell lung cancer.  She has been seen by medical oncology is now seen for follow-up.  We have discussed our suspicion and the patient is inclined to proceed with treatment.  She is asymptomatic specifically [place] cough hemoptysis or chest tightness.  COMPLICATIONS OF TREATMENT: none  FOLLOW UP COMPLIANCE: keeps appointments   PHYSICAL EXAM:  There were no vitals taken for this visit. Wheelchair-bound female in NAD.  Well-developed well-nourished patient in NAD. HEENT reveals PERLA, EOMI, discs not visualized.  Oral cavity is clear. No oral mucosal lesions are identified. Neck is clear without evidence of cervical or supraclavicular adenopathy. Lungs are clear to A&P. Cardiac examination is essentially unremarkable with regular rate and rhythm without murmur rub or thrill. Abdomen is benign with no organomegaly or masses noted. Motor sensory and DTR levels are equal and symmetric in the upper and lower extremities. Cranial nerves II through XII are grossly intact. Proprioception is intact. No peripheral adenopathy or edema is identified. No motor or sensory levels are noted. Crude visual fields are within normal range.  RADIOLOGY RESULTS: CT scan and previous PET CT scan reviewed compatible with  above-stated findings  PLAN: Slow-growing stage I non-small cell lung cancer left upper lobe in 75 year old female is our primary diagnosis.  I have offered SBRT 60 Gray over 5 fractions and the patient has excepted treatment.  Risks and benefits of treatment occluding possible mild cough fatigue all were discussed in detail with the patient.  She comprehends my recommendations well.  I have personally set up and ordered CT simulation.  We will use 4-dimensional treatment planning as well as motion restriction.  I would like to take this opportunity to thank you for allowing me to participate in the care of your patient.[name], MD

## 2020-07-19 NOTE — Assessment & Plan Note (Addendum)
#  Left upper lobe lung nodule 6 mm MAY 2021-slow-growing currently 9 mm May 2022 CT scan.  Difficulty biopsy-peripheral/subpleural.  Discussed with Dr. Donella Stade; plan SBRT.  Do not suspect from breast cancer.  # Left breast stage I breast cancer ER PR positive HER-2 negative; status post lumpectomy.  On arimidex. Mammogram-May 2022- left mammo-WNL.   # BMD- FEB 2021- T score : - 1.5.on ca+vit D-stable.  # CKD- III- creat 1.7.[NOV 2021-]; S stable.- Dr.Kolluru/follow-up with PCP.  # DISPOSITION:  #Follow-up TBD- Dr.B  Addendum: Follow-up in 3 months;MD;  labs-CBC BMP-Dr.B  # I reviewed the blood work- with the patient in detail; also reviewed the imaging independently [as summarized above]; and with the patient in detail.

## 2020-07-19 NOTE — Progress Notes (Signed)
Tam[name] CONSULT NOTE  Patient Care Team: Tammy Roys, DO as PCP - General (Family Medicine) Tammy [name], Tammy November, MD as PCP - Cardiology (Cardiology) Tammy [name] Tammy November, MD as Consulting Physician (Cardiology) Tammy Sons, MD as Consulting Physician (Urology) Tammy Dana, MD as Consulting Physician (Nephrology) Tammy Roles, MD as Referring Physician (Orthopedic Surgery) Tammy Simmer, MD as Attending Physician (Endocrinology) Tammy Huxley, MD as Referring Physician (Vascular Surgery) Tammy [name], [facility] as Pharmacist (Pharmacist) Tammy Ingles, RN as Case Manager (General Practice)  CHIEF COMPLAINTS/PURPOSE OF CONSULTATION: Breast cancer  #  Oncology History Overview Note   # July 2020 Left Breast- IMC; 14 mm; Grade: 2; Lymphovascular Invasion: Not identified;  pT1c pN0(sn); ER positive, PR positive, HER2 negative. [s/p lumpec; Dr.Byrnett]; rs/p RT [finished Sep, 9th 2020]  # Sep end 2020- START Arimidexa  # JAN 2022- 42m/left upper lobe lung nodule; PET positive; right upper lobe pneumonia/suspected reactive adenopathy. STAGE I Lung ca [suspected based on imaging no biopsy]- SBRT Dr.Crystal [May 2022]  # CKD-III/COPD/ CHF-compesated/PAD; CAD  DIAGNOSIS: Left breast cancer  STAGE: 1  ;GOALS: Cure  CURRENT/MOST RECENT THERAPY: arimidex   Malignant neoplasm of lower-inner quadrant of left female breast ([facility]  08/14/2018 Initial Diagnosis   Malignant neoplasm of lower-inner quadrant of left female breast ([facility]   Carcinoma of lower-inner quadrant of left breast in female, estrogen receptor positive ([place]  10/02/2018 Initial Diagnosis   Carcinoma of lower-inner quadrant of left breast in female, estrogen receptor positive ([place]      HISTORY OF PRESENTING ILLNESS:  EKAMBREY [id number].[id number].  female to history of stage I ER PR positive/HER-2 negative breast cancer is in for follow-up/review results of recent follow-up CAT scan  Patient  has chronic shortness of breath chronic mild cough.  Fatigue.  Chronic back pain.  Not any worse.  Denies any new lumps or bumps.   Review of Systems  Constitutional: Negative for chills, diaphoresis, fever, malaise/fatigue and weight loss.  HENT: Negative for nosebleeds and sore throat.   Eyes: Negative for double vision.  Respiratory: Negative for cough, hemoptysis, sputum production, shortness of breath and wheezing.   Cardiovascular: Negative for chest pain, palpitations, orthopnea and leg swelling.  Gastrointestinal: Negative for abdominal pain, blood in stool, constipation, diarrhea, heartburn, melena, nausea and vomiting.  Genitourinary: Negative for dysuria, frequency and urgency.  Musculoskeletal: Positive for back pain and joint pain.  Skin: Negative.  Negative for itching and rash.  Neurological: Negative for dizziness, tingling, focal weakness, weakness and headaches.  Endo/Heme/Allergies: Does not bruise/bleed easily.  Psychiatric/Behavioral: Negative for depression. The patient is not nervous/anxious and does not have insomnia.      MEDICAL HISTORY:  Past Medical History:  Diagnosis Date  . Asthma   . Benign neoplasm of colon   . Breast cancer ([facility] 08/2018   left breast  . Cervical disc disease    "bulging" - no limitations per pt  . Chronic diastolic CHF (congestive heart failure) ([facility]    a. 07/2012 Echo: Nl EF. mild inferoseptal HK, Gr 2 DD, mild conc LVH, mild PR/TR, mild PAH.  . CKD (chronic kidney disease), stage III ([place]   . COPD (chronic obstructive pulmonary disease) ([facility]   . Coronary artery disease    a. 11/2011 Cath: LAD 517m, LCX min irregs, RCA 70p, 10061mth L->R collats, EF 60%-->Med Rx.  . Diabetes mellitus without complication ([place] . Fusion of lumbar spine 12/22/2015  .  GERD (gastroesophageal reflux disease)   . History of DVT (deep vein thrombosis)   . History of tobacco abuse    a. Quit 2011.  [name] Hyperlipidemia   . Hypertension   .  Hypothyroidism   . Obesity   . Palpitations   . PE (pulmonary embolism) 10/15  . Personal history of radiation therapy   . PONV (postoperative nausea and vomiting)   . Pulmonary embolus ([place]) 12/25/2013   RLL. Presented with SOB and elevated d-dimer.   [name] PVD (peripheral vascular disease) ([place])    a. PTA of right leg and left femoral artery with stenosis.  . Renal insufficiency   . Stroke [facility]) 2015   TIAs - no deficits    SURGICAL HISTORY: Past Surgical History:  Procedure Laterality Date  . ANGIOPLASTY / STENTING FEMORAL     PVD; angioplasty right leg and left femoral artery with stenosis.   . APPENDECTOMY  Oct. 2016  . BACK SURGERY  03/2012   nerve stimulator inserted  . BACK SURGERY     screws, rods replaced with new hardware.  [name] BACK SURGERY  11/2016  . BREAST LUMPECTOMY Left 08/26/2018  . BREAST SURGERY    . CARDIAC CATHETERIZATION  12/07/2011   Mid LAD 50%, distal LAD 50%, mid RCA 70%, distal RCA 100%   . CARDIAC CATHETERIZATION  01/04/2011   100% occluded mid RCA with good collaterals from distal LAD, normal LVEF.  [name] CHOLECYSTECTOMY    . COLONOSCOPY  2013  . COLONOSCOPY WITH PROPOFOL N/A 05/24/2015   Procedure: COLONOSCOPY WITH PROPOFOL;  Surgeon: [name], MD;  Location: [facility];  Service: Endoscopy;  Laterality: N/A;  Diabetic - oral meds  . COLONOSCOPY WITH PROPOFOL N/A 01/28/2019   Procedure: COLONOSCOPY WITH PROPOFOL;  Surgeon: [name], MD;  Location: [facility];  Service: Endoscopy;  Laterality: N/A;  . DIAGNOSTIC LAPAROSCOPY    . ESOPHAGOGASTRODUODENOSCOPY (EGD) WITH PROPOFOL N/A 01/28/2019   Procedure: ESOPHAGOGASTRODUODENOSCOPY (EGD) WITH PROPOFOL;  Surgeon: [name], MD;  Location: [facility];  Service: Endoscopy;  Laterality: N/A;  . LAPAROSCOPIC APPENDECTOMY N/A 01/06/2015   Procedure: APPENDECTOMY LAPAROSCOPIC;  Surgeon: [name], MD;  Location: [facility];  Service: General;  Laterality: N/A;  .  LEFT HEART CATH AND CORONARY ANGIOGRAPHY Left 04/04/2019   Procedure: LEFT HEART CATH AND CORONARY ANGIOGRAPHY;  Surgeon: [name], MD;  Location: [place] [facility];  Service: Cardiovascular;  Laterality: Left;  [name] MASTECTOMY W/ SENTINEL NODE BIOPSY Left 08/26/2018   Procedure: PARTIAL MASTECTOMY WIDE EXCISION WITH SENTINEL LYMPH NODE BIOPSY LEFT;  Surgeon: [name], MD;  Location: [facility];  Service: General;  Laterality: Left;  . POLYPECTOMY  05/24/2015   Procedure: POLYPECTOMY;  Surgeon: [name], MD;  Location: [place];  Service: Endoscopy;;  . THYROIDECTOMY    . TOTAL KNEE ARTHROPLASTY Right 11/13/2019  . TRIGGER FINGER RELEASE      SOCIAL HISTORY: Social History   Socioeconomic History  . Marital status: Married    Spouse name: Not on file  . Number of children: 2  . Years of education: some college, Production assistant, radio   . Highest education level: Associate degree: occupational, Hotel manager, or vocational program  Occupational History  . Occupation: Retired  Tobacco Use  . Smoking status: Former Smoker    Packs/day: 0.50    Years: 15.00    Pack years: 7.50    Types: Cigarettes    Quit date: 03/14/2007  Years since quitting: 13.3  . Smokeless tobacco: Never Used  Vaping Use  . Vaping Use: Never used  Substance and Sexual Activity  . Alcohol use: No    Alcohol/week: 0.0 standard drinks  . Drug use: Never  . Sexual activity: Yes    Birth control/protection: Post-menopausal  Other Topics Concern  . Not on file  Social History Narrative   Lives at home with husband in [place]. Quit smoking 10 years ago; never alcohol. 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    Social Determinants of Health   Financial Resource Strain: Low Risk   . Difficulty of Paying Living Expenses: Not hard at all  Food Insecurity: No Food Insecurity  . Worried About Charity fundraiser in the Last Year:  Never true  . Ran Out of Food in the Last Year: Never true  Transportation Needs: No Transportation Needs  . Lack of Transportation (Medical): No  . Lack of Transportation (Non-Medical): No  Physical Activity: Sufficiently Active  . Days of Exercise per Week: 7 days  . Minutes of Exercise per Session: 60 min  Stress: No Stress Concern Present  . Feeling of Stress : Not at all  Social Connections: Socially Integrated  . Frequency of Communication with Friends and Family: More than three times a week  . Frequency of Social Gatherings with Friends and Family: More than three times a week  . Attends Religious Services: More than 4 times per year  . Active Member of Clubs or Organizations: Yes  . Attends Archivist Meetings: 1 to 4 times per year  . Marital Status: Married  Human resources officer Violence: Unknown  . Fear of Current or Ex-Partner: No  . Emotionally Abused: No  . Physically Abused: No  . Sexually Abused: Not on file    FAMILY HISTORY: Family History  Problem Relation Age of Onset  . Hypertension Father   . Heart disease Father   . Heart attack Father   . Diabetes Father   . Cancer Mother        colon  . Heart attack Brother   . Diabetes Brother   . Hypertension Brother   . Heart disease Brother   . Heart attack Brother   . Diabetes Brother   . Hypertension Brother   . Heart disease Brother   . Heart attack Brother   . Diabetes Brother   . Hypertension Brother   . Heart disease Brother   . Cancer Sister        lung  . COPD Sister   . COPD Sister   . Breast cancer Neg Hx     ALLERGIES:  is allergic to hydrocodone, aleve [naproxen sodium], contrast media [iodinated diagnostic agents], ioxaglate, oxycodone, ranexa [ranolazine], sulfa antibiotics, and tramadol.  MEDICATIONS:  Current Outpatient Medications  Medication Sig Dispense Refill  . anastrozole (ARIMIDEX) 1 MG tablet Take 1 tablet (1 mg total) by mouth daily. 30 tablet 4  . blood glucose  meter kit and supplies KIT Dispense based on patient and insurance preference. Use one time daily as directed, E11.9 1 each 0  . Calcium Carb-Cholecalciferol (CALCIUM 600 + D) 600-200 MG-UNIT TABS Take 1 tablet by mouth daily.     . cetirizine (ZYRTEC) 10 MG tablet TAKE 1 TABLET BY MOUTH DAILY 30 tablet 0  . cholecalciferol (VITAMIN D3) 25 MCG (1000 UT) tablet Take 1,000 Units by mouth daily.    . cyanocobalamin (,VITAMIN  B-12,) 1000 MCG/ML injection Inject 1,000 mcg into the muscle once a week. Then once a month x 4 months    . Dulaglutide (TRULICITY) 4.5 PJ/0.9TO SOPN Inject 4.5 mg as directed once a week. 6 mL 1  . DULoxetine (CYMBALTA) 60 MG capsule TAKE 1 CAPSULE BY MOUTH DAILY (Patient taking differently: Take 60 mg by mouth daily.) 90 capsule 1  . ELIQUIS 2.5 MG TABS tablet TAKE 1 TABLET BY MOUTH TWICE DAILY (Patient taking differently: Take 2.5 mg by mouth 2 (two) times daily.) 180 tablet 1  . gabapentin (NEURONTIN) 300 MG capsule Take 1 capsule (300 mg total) by mouth 3 (three) times daily. 270 capsule 3  . glipiZIDE (GLUCOTROL) 5 MG tablet TAKE ONE TABLET BY MOUTH EVERY DAY BEFORE BREAKFAST 90 tablet 1  . insulin degludec (TRESIBA FLEXTOUCH) 100 UNIT/ML FlexTouch Pen Inject 24 Units into the skin daily. 22 mL 1  . Insulin Pen Needle (PEN NEEDLES 3/16") 31G X 5 MM MISC 1 each by Does not apply route daily. 100 each 12  . isosorbide mononitrate (IMDUR) 60 MG 24 hr tablet TAKE ONE TABLET BY MOUTH AT BEDTIME AND HALF A TABLET EVERY MORNING (Patient taking differently: Take 30-60 mg by mouth See admin instructions. Take  tablet (39m) by mouth every morning and take 1 tablet (651m by mouth every night) 135 tablet 3  . JARDIANCE 25 MG TABS tablet TAKE ONE TABLET 25MG EVERY DAY BEFORE BREAKFAST (Patient taking differently: Take 25 mg by mouth daily before breakfast.) 90 tablet 1  . metoprolol succinate (TOPROL-XL) 50 MG 24 hr tablet TAKE ONE TABLET EVERY DAY WITH OR IMMEDIATELY FOLLOWING A MEAL 90  tablet 1  . mometasone (ELOCON) 0.1 % lotion Apply topically as directed. Qd up to 5 days a week to aa scalp until clear, then prn flares 60 mL 1  . montelukast (SINGULAIR) 10 MG tablet Take 1 tablet (10 mg total) by mouth at bedtime. 90 tablet 1  . MYRBETRIQ 50 MG TB24 tablet TAKE 1 TABLET BY MOUTH DAILY 30 tablet 2  . pantoprazole (PROTONIX) 40 MG tablet Take 1 tablet (40 mg total) by mouth daily. 90 tablet 1  . Polyethylene Glycol 400 (BLINK TEARS) 0.25 % SOLN Place 1 drop into both eyes 3 (three) times daily as needed (dry/irritated eyes.).     . Marland KitchenROAIR HFA 108 (90 Base) MCG/ACT inhaler TAKE 2 PUFFS EVERY 4 HOURS AS NEEDED FORWHEEZING OR SHORTNESS OF BREATH 8.5 g 3  . Pumpkin Seed (AZO MEN BLADDER CONTROL) 500 MG CAPS Take by mouth.    . rosuvastatin (CRESTOR) 40 MG tablet TAKE 1 TABLET BY MOUTH ONCE DAILY (Patient taking differently: Take 40 mg by mouth daily.) 90 tablet 1  . senna (SENOKOT) 8.6 MG TABS tablet Take 8.6 mg by mouth daily as needed for mild constipation.    . Marland KitchenYNTHROID 150 MCG tablet TAKE 1 TABLET EVERY DAY ON EMPTY STOMACHWITH A GLASS OF WATER AT LEAST 30-60 MINBEFORE BREAKFAST 30 tablet 2  . trimethoprim (TRIMPEX) 100 MG tablet TAKE ONE TABLET BY MOUTH EVERY DAY (Patient taking differently: Take 100 mg by mouth daily.) 90 tablet 1  . nitroGLYCERIN (NITROSTAT) 0.4 MG SL tablet Place 1 tablet (0.4 mg total) under the tongue every 5 (five) minutes as needed for chest pain. (Patient not taking: Reported on 07/19/2020) 25 tablet 6   No current facility-administered medications for this visit.      . Marland KitchenPHYSICAL EXAMINATION: ECOG PERFORMANCE STATUS: 1 - Symptomatic but completely ambulatory  Vitals:   07/19/20 1045  BP: 130/81  Pulse: 71  Resp: 20  Temp: 98.4 F (36.9 C)   Filed Weights   07/19/20 1045  Weight: 208 lb (94.3 kg)    Physical Exam Constitutional:      Comments: Patient is ambulating with a cane.  She is accompanied by her husband.  HENT:     Head:  Normocephalic and atraumatic.     Mouth/Throat:     Pharynx: No oropharyngeal exudate.  Eyes:     Pupils: Pupils are equal, round, and reactive to light.  Cardiovascular:     Rate and Rhythm: Normal rate and regular rhythm.  Pulmonary:     Effort: Pulmonary effort is normal. No respiratory distress.     Breath sounds: Normal breath sounds. No wheezing.  Abdominal:     General: Bowel sounds are normal. There is no distension.     Palpations: Abdomen is soft. There is no mass.     Tenderness: There is no abdominal tenderness. There is no guarding or rebound.  Musculoskeletal:        General: No tenderness. Normal range of motion.     Cervical back: Normal range of motion and neck supple.  Skin:    General: Skin is warm.     Comments: Right and left BREAST exam (in the presence of nurse)- no unusual skin changes or dominant masses felt. Surgical scars noted.    Neurological:     Mental Status: She is alert and oriented to person, place, and time.  Psychiatric:        Mood and Affect: Affect normal.    LABORATORY DATA:  I have reviewed the data as listed Lab Results  Component Value Date   WBC 8.9 07/19/2020   HGB 12.7 07/19/2020   HCT 41.0 07/19/2020   MCV 86.7 07/19/2020   PLT 285 07/19/2020   Recent Labs    11/11/19 1424 11/27/19 1356 01/19/20 1005 02/10/20 1440 04/17/20 0340 04/17/20 0450 04/18/20 0623 05/11/20 1504 07/19/20 1019  NA 141 140   < > 144 137  --  133* 141 136  K 4.9 5.4*   < > 5.2 4.1  --  4.3 4.6 4.9  CL 102 102   < > 106 106  --  104 102 104  CO2 25 23   < > 23 23  --  20* 22 23  GLUCOSE 109* 108*   < > 84 221*  --  127* 117* 173*  BUN 40* 28*   < > 23 25*  --  16 26 34*  CREATININE 1.77* 1.61*   < > 1.75* 1.73*  --  1.57* 1.73* 1.73*  CALCIUM 9.1 9.6   < > 9.4 7.9*  --  8.1* 9.2 9.0  GFRNONAA 28* 31*   < > 28* 30*  --  34*  --  30*  GFRAA 32* 36*  --  32*  --   --   --   --   --   PROT 6.9  --    < > 7.2  --  6.8  --  7.0 7.0  ALBUMIN 4.1   --    < > 4.3  --  3.3*  --  4.3 3.6  AST 14  --    < > 15  --  15  --  15 14*  ALT 10  --    < > 11  --  11  --  10 11  ALKPHOS 80  --    < >  84  --  68  --  97 75  BILITOT 0.4  --    < > 0.3  --  0.8  --  0.4 0.8  BILIDIR  --   --   --   --   --  0.1  --   --   --   IBILI  --   --   --   --   --  0.7  --   --   --    < > = values in this interval not displayed.    RADIOGRAPHIC STUDIES: I have personally reviewed the radiological images as listed and agreed with the findings in the report. CT Chest Wo Contrast  Result Date: 07/15/2020 CLINICAL DATA:  Evaluate lung nodule EXAM: CT CHEST WITHOUT CONTRAST TECHNIQUE: Multidetector CT imaging of the chest was performed following the standard protocol without IV contrast. COMPARISON:  PET-CT 04/08/2020 FINDINGS: Cardiovascular: The heart size appears within normal limits. No pericardial effusion. Aortic atherosclerosis and coronary artery calcifications.Focal dilatation of the descending thoracic aorta measures up to 3.6 cm, image 108/7. Mediastinum/Nodes: No enlarged mediastinal or axillary lymph nodes. Thyroid gland, trachea, and esophagus demonstrate no significant findings. Lungs/Pleura: Paraseptal and centrilobular emphysema with diffuse bronchial wall thickening. Patchy postinflammatory changes are again identified within the lateral right lung base and lateral left lung base with ground-glass attenuation, interstitial reticulation and mild subpleural airspace disease. Left upper lobe subpleural nodule measures 0.9 cm, image 63/3. Stable compared with 03/23/2020 but increased from 7 mm on 07/17/2019. On 03/11/2018 this measured 0.3 cm. Within the posterolateral left upper lobe there is a stable 2 mm lung nodule, image 59/3. Stable punctate nodule within the periphery of the right upper lobe, image 33/3. This is unchanged from 2019 compatible with a benign abnormality. Upper Abdomen: No acute abnormality. Previous cholecystectomy. Aortic  atherosclerosis. Musculoskeletal: No chest wall mass or suspicious bone lesions identified. Dorsal column stimulator noted within the lower thoracic canal. IMPRESSION: 1. Slowly growing left upper lobe subpleural nodule is unchanged in size from 03/23/2020. However, given the mild FDG uptake on recent 04/08/2020 PET/CT and the slow interval growth since 2019 this remains suspicious for indolent pulmonary neoplasm. 2. Similar postinflammatory/postinfectious changes are noted within the periphery of both lung bases. 3. Emphysema and aortic atherosclerosis. 4. Coronary artery calcifications. Aortic Atherosclerosis (ICD10-I70.0) and Emphysema (ICD10-J43.9). Electronically Signed   By: Kerby Moors M.D.   On: 07/15/2020 10:23   MM DIAG BREAST TOMO BILATERAL  Result Date: 07/16/2020 CLINICAL DATA:  LEFT lumpectomy and radiation therapy in 2020. EXAM: DIGITAL DIAGNOSTIC BILATERAL MAMMOGRAM WITH TOMOSYNTHESIS AND CAD TECHNIQUE: Bilateral digital diagnostic mammography and breast tomosynthesis was performed. The images were evaluated with computer-aided detection. COMPARISON:  Previous exam(s). ACR Breast Density Category b: There are scattered areas of fibroglandular density. FINDINGS: Post operative changes are seen in the LEFTbreast. No suspicious mass, distortion, or microcalcifications are identified to suggest presence of malignancy. IMPRESSION: No mammographic evidence for malignancy. RECOMMENDATION: Screening mammogram in one year.(Code:[id number]) Per protocol, as the patient is now 2 or more years status post lumpectomy, she may return to annual screening mammography in 1 year. However, given the history of breast cancer, the patient remains eligible for annual diagnostic mammography if preferred. I have discussed the findings and recommendations with the patient. If applicable, a reminder letter will be sent to the patient regarding the next appointment. BI-RADS CATEGORY  2: Benign. Electronically Signed   By:  Nolon Nations M.D.   On: 07/16/2020 11:45  ASSESSMENT & PLAN:   Carcinoma of lower-inner quadrant of left breast in female, estrogen receptor positive ([place]) #Left upper lobe lung nodule 6 mm MAY 2021-slow-growing currently 9 mm May 2022 CT scan.  Difficulty biopsy-peripheral/subpleural.  Discussed with Dr. Donella Stade; plan SBRT.  Do not suspect from breast cancer.  # Left breast stage I breast cancer ER PR positive HER-2 negative; status post lumpectomy.  On arimidex. Mammogram-May 2022- left mammo-WNL.   # BMD- FEB 2021- T score : - 1.5.on ca+vit D-stable.  # CKD- III- creat 1.7.[NOV 2021-]; S stable.- Dr.Kolluru/follow-up with PCP.  # DISPOSITION:  #Follow-up TBD- Dr.B  Addendum: Follow-up in 3 months;MD;  labs-CBC BMP-Dr.B  # I reviewed the blood work- with the patient in detail; also reviewed the imaging independently [as summarized above]; and with the patient in detail.      All questions were answered. The patient/family knows to call the clinic with any problems, questions or concerns.       Cammie Sickle, MD 07/19/2020 2:41 PM

## 2020-07-19 NOTE — Progress Notes (Signed)
Follow-up in 3 months;MD; labs-CBC BMP-Dr.B

## 2020-07-21 ENCOUNTER — Ambulatory Visit (INDEPENDENT_AMBULATORY_CARE_PROVIDER_SITE_OTHER): Payer: Medicare Other | Admitting: Pulmonary Disease

## 2020-07-21 ENCOUNTER — Ambulatory Visit
Admission: RE | Admit: 2020-07-21 | Discharge: 2020-07-21 | Disposition: A | Discharge status: Home / Self Care | Payer: Medicare Other | Source: Ambulatory Visit | Attending: Radiation Oncology | Admitting: Radiation Oncology

## 2020-07-21 ENCOUNTER — Other Ambulatory Visit: Payer: Self-pay

## 2020-07-21 ENCOUNTER — Encounter: Payer: Self-pay | Admitting: Pulmonary Disease

## 2020-07-21 VITALS — BP 124/78 | HR 80 | Temp 97.5°F | Ht 66.0 in | Wt 213.4 lb

## 2020-07-21 DIAGNOSIS — R0602 Shortness of breath: Secondary | ICD-10-CM | POA: Diagnosis not present

## 2020-07-21 DIAGNOSIS — R911 Solitary pulmonary nodule: Secondary | ICD-10-CM

## 2020-07-21 DIAGNOSIS — J449 Chronic obstructive pulmonary disease, unspecified: Secondary | ICD-10-CM

## 2020-07-21 DIAGNOSIS — C3412 Malignant neoplasm of upper lobe, left bronchus or lung: Secondary | ICD-10-CM | POA: Insufficient documentation

## 2020-07-21 DIAGNOSIS — Z9012 Acquired absence of left breast and nipple: Secondary | ICD-10-CM | POA: Diagnosis not present

## 2020-07-21 DIAGNOSIS — Z510 Encounter for antineoplastic radiation therapy: Secondary | ICD-10-CM | POA: Diagnosis not present

## 2020-07-21 MED ORDER — TRELEGY ELLIPTA 100-62.5-25 MCG/INH IN AEPB: 1.0000 | INHALATION_SPRAY | Freq: Every day | RESPIRATORY_TRACT | 0 refills | Status: AC

## 2020-07-21 NOTE — Patient Instructions (Signed)
We are giving you a trial of Trelegy Ellipta 1 inhalation daily.  Make sure you rinse your mouth well after you use it.  You may still use your emergency inhaler only if needed.  We are going to schedule you for breathing tests.  We will see you in follow-up in 3 months time call sooner should any new problems arise.

## 2020-07-21 NOTE — Progress Notes (Signed)
Subjective:    Patient ID: Tammy [name], female    DOB: 20-Apr-1945, 75 y.o.   MRN: [id number] Chief Complaint  Patient presents with   pulmonary consult    Per Dr. Marcelino Freestone CT. C/o occ wheezing and sob with exertion. Recent dx of lung CA.     HPI Tammy [name] is a very pleasant 75 year old remote former smoker referred for various issues.  1 is that she has difficulties with dyspnea particularly when her back hurts.  She is kindly referred by Dr. Park Liter.  The patient has a history of left breast cancer stage I which was ER/PR positive and HER2 negative.  She was status postlumpectomy.  She is on Arimidex.  She had mammogram on 16 Jul 2020 which showed no abnormalities.  She has noted shortness of breath particularly when her back hurts for the last 2 months.  She has also noted increased wheezing.  She has not had any significant sputum production and only rare cough.  No abscesses.  General issue has been that she has been noted to have a very peripheral pleural-based nodule on the left upper lobe.  This nodule has grown since first being noted in 2019.  In addition, the nodule has been noted to have low uptake on PET/CT.  The nodule is in position difficult to biopsy and would require resection.  The patient is not inclined to undergo resection.  This was discussed previously in a multidisciplinary approach and the consensus was to refer for SBRT.  The patient states that she has appointment with radiation oncology today.  Endorse any other symptomatology.  She has not had any chills or sweats.  No orthopnea or paroxysmal nocturnal dyspnea.  No lower extremity edema.  No calf tenderness.  Other findings on her most recent CT were that of emphysema inflammatory changes particularly at the bases.   Review of Systems A 10 point review of systems was performed and it is as noted above otherwise negative.  Past Medical History:  Diagnosis Date   Asthma    Benign neoplasm of colon     Breast cancer ([place]) 08/2018   left breast   Cervical disc disease    "bulging" - no limitations per pt   Chronic diastolic CHF (congestive heart failure) ([place])    a. 07/2012 Echo: Nl EF. mild inferoseptal HK, Gr 2 DD, mild conc LVH, mild PR/TR, mild PAH.   CKD (chronic kidney disease), stage III (HCC)    COPD (chronic obstructive pulmonary disease) (HCC)    Coronary artery disease    a. 11/2011 Cath: LAD 33md, LCX min irregs, RCA 70p, 1018mith L->R collats, EF 60%-->Med Rx.   Diabetes mellitus without complication (HCC)    Fusion of lumbar spine 12/22/2015   GERD (gastroesophageal reflux disease)    History of DVT (deep vein thrombosis)    History of tobacco abuse    a. Quit 2011.   Hyperlipidemia    Hypertension    Hypothyroidism    Obesity    Palpitations    PE (pulmonary embolism) 10/15   Personal history of radiation therapy    PONV (postoperative nausea and vomiting)    [place]   RLL. Presented with SOB and elevated d-dimer.    PVD (peripheral vascular disease) ([facility]   a. PTA of right leg and left femoral artery with stenosis.   Renal insufficiency    [facility]   TIAs - no deficits   Past Surgical  History:  Procedure Laterality Date   ANGIOPLASTY / STENTING FEMORAL     PVD; angioplasty right leg and left femoral artery with stenosis.    APPENDECTOMY  Oct. 2016   BACK SURGERY  03/2012   nerve stimulator inserted   BACK SURGERY     screws, rods replaced with new hardware.   BACK SURGERY  11/2016   BREAST LUMPECTOMY Left 08/26/2018   BREAST SURGERY     CARDIAC CATHETERIZATION  12/07/2011   Mid LAD 50%, distal LAD 50%, mid RCA 70%, distal RCA 100%    CARDIAC CATHETERIZATION  01/04/2011   100% occluded mid RCA with good collaterals from distal LAD, normal LVEF.   CHOLECYSTECTOMY     COLONOSCOPY  2013   COLONOSCOPY WITH PROPOFOL N/A 05/24/2015   Procedure: COLONOSCOPY WITH PROPOFOL;  Surgeon: [name], MD;  Location: [place];  Service: Endoscopy;  Laterality: N/A;  Diabetic - oral meds   COLONOSCOPY WITH PROPOFOL N/A 01/28/2019   Procedure: COLONOSCOPY WITH PROPOFOL;  Surgeon: [name], MD;  Location: [facility];  Service: Endoscopy;  Laterality: N/A;   DIAGNOSTIC LAPAROSCOPY     ESOPHAGOGASTRODUODENOSCOPY (EGD) WITH PROPOFOL N/A 01/28/2019   Procedure: ESOPHAGOGASTRODUODENOSCOPY (EGD) WITH PROPOFOL;  Surgeon: [name], MD;  Location: [facility];  Service: Endoscopy;  Laterality: N/A;   LAPAROSCOPIC APPENDECTOMY N/A 01/06/2015   Procedure: APPENDECTOMY LAPAROSCOPIC;  Surgeon: [name], MD;  Location: [facility];  Service: General;  Laterality: N/A;   LEFT HEART CATH AND CORONARY ANGIOGRAPHY Left 04/04/2019   Procedure: LEFT HEART CATH AND CORONARY ANGIOGRAPHY;  Surgeon: [name], MD;  Location: [place] [facility];  Service: Cardiovascular;  Laterality: Left;   MASTECTOMY W/ SENTINEL NODE BIOPSY Left 08/26/2018   Procedure: PARTIAL MASTECTOMY WIDE EXCISION WITH SENTINEL LYMPH NODE BIOPSY LEFT;  Surgeon: [name], MD;  Location: [facility];  Service: General;  Laterality: Left;   POLYPECTOMY  05/24/2015   Procedure: POLYPECTOMY;  Surgeon: [name], MD;  Location: [place];  Service: Endoscopy;;   THYROIDECTOMY     TOTAL KNEE ARTHROPLASTY Right 11/13/2019   TRIGGER FINGER RELEASE     Family History  Problem Relation Age of Onset   Hypertension Father    Heart disease Father    Heart attack Father    Diabetes Father    Cancer Mother        colon   Heart attack Brother    Diabetes Brother    Hypertension Brother    Heart disease Brother    Heart attack Brother    Diabetes Brother    Hypertension Brother    Heart disease Brother    Heart attack Brother    Diabetes Brother    Hypertension Brother    Heart disease Brother    Cancer Sister        lung   COPD Sister    COPD Sister    Breast cancer Neg Hx    Social  History   Tobacco Use   Smoking status: Former Smoker    Packs/day: 0.50    Years: 15.00    Pack years: 7.50    Types: Cigarettes    Quit date: 03/14/2007    Years since quitting: 13.3   Smokeless tobacco: Never Used  Substance Use Topics   Alcohol use: No    Alcohol/week: 0.0 standard drinks   Allergies  Allergen Reactions   Hydrocodone Other (See Comments)    Unresponsive   Aleve [Naproxen  Sodium]     Hives & itching    Contrast Media [Iodinated Diagnostic Agents]     Pt avoids due to kidney issues   Ioxaglate Other (See Comments)    (iodine) Pt avoids due to kidney issues    Oxycodone Other (See Comments)    hallucinations   Ranexa [Ranolazine]     Sick on stomach   Sulfa Antibiotics Other (See Comments)    Pt avoids due to kidney issues   Tramadol Other (See Comments)    nightmares   Current Meds  Medication Sig   anastrozole (ARIMIDEX) 1 MG tablet Take 1 tablet (1 mg total) by mouth daily.   blood glucose meter kit and supplies KIT Dispense based on patient and insurance preference. Use one time daily as directed, E11.9   Calcium Carb-Cholecalciferol (CALCIUM 600 + D) 600-200 MG-UNIT TABS Take 1 tablet by mouth daily.    cetirizine (ZYRTEC) 10 MG tablet TAKE 1 TABLET BY MOUTH DAILY   cholecalciferol (VITAMIN D3) 25 MCG (1000 UT) tablet Take 1,000 Units by mouth daily.   cyanocobalamin (,VITAMIN B-12,) 1000 MCG/ML injection Inject 1,000 mcg into the muscle once a week. Then once a month x 4 months   Dulaglutide (TRULICITY) 4.5 TI/4.5YK SOPN Inject 4.5 mg as directed once a week.   DULoxetine (CYMBALTA) 60 MG capsule TAKE 1 CAPSULE BY MOUTH DAILY (Patient taking differently: Take 120 mg by mouth daily.)   ELIQUIS 2.5 MG TABS tablet TAKE 1 TABLET BY MOUTH TWICE DAILY (Patient taking differently: Take 2.5 mg by mouth 2 (two) times daily.)   gabapentin (NEURONTIN) 300 MG capsule Take 1 capsule (300 mg total) by mouth 3 (three) times daily.   glipiZIDE (GLUCOTROL) 5 MG  tablet TAKE ONE TABLET BY MOUTH EVERY DAY BEFORE BREAKFAST   insulin degludec (TRESIBA FLEXTOUCH) 100 UNIT/ML FlexTouch Pen Inject 24 Units into the skin daily.   Insulin Pen Needle (PEN NEEDLES 3/16") 31G X 5 MM MISC 1 each by Does not apply route daily.   isosorbide mononitrate (IMDUR) 60 MG 24 hr tablet TAKE ONE TABLET BY MOUTH AT BEDTIME AND HALF A TABLET EVERY MORNING (Patient taking differently: Take 30-60 mg by mouth See admin instructions. Take  tablet (43m) by mouth every morning and take 1 tablet (69m by mouth every night)   JARDIANCE 25 MG TABS tablet TAKE ONE TABLET 25MG EVERY DAY BEFORE BREAKFAST (Patient taking differently: Take 25 mg by mouth daily before breakfast.)   metoprolol succinate (TOPROL-XL) 50 MG 24 hr tablet TAKE ONE TABLET EVERY DAY WITH OR IMMEDIATELY FOLLOWING A MEAL   mometasone (ELOCON) 0.1 % lotion Apply topically as directed. Qd up to 5 days a week to aa scalp until clear, then prn flares   montelukast (SINGULAIR) 10 MG tablet Take 1 tablet (10 mg total) by mouth at bedtime.   MYRBETRIQ 50 MG TB24 tablet TAKE 1 TABLET BY MOUTH DAILY   nitroGLYCERIN (NITROSTAT) 0.4 MG SL tablet Place 1 tablet (0.4 mg total) under the tongue every 5 (five) minutes as needed for chest pain.   pantoprazole (PROTONIX) 40 MG tablet Take 1 tablet (40 mg total) by mouth daily.   Polyethylene Glycol 400 (BLINK TEARS) 0.25 % SOLN Place 1 drop into both eyes 3 (three) times daily as needed (dry/irritated eyes.).    PROAIR HFA 108 (90 Base) MCG/ACT inhaler TAKE 2 PUFFS EVERY 4 HOURS AS NEEDED FORWHEEZING OR SHORTNESS OF BREATH   Pumpkin Seed-Soy Germ (AZO BLADDER CONTROL/GO-LESS PO) Take by mouth.  rosuvastatin (CRESTOR) 40 MG tablet TAKE 1 TABLET BY MOUTH ONCE DAILY (Patient taking differently: Take 40 mg by mouth daily.)   senna (SENOKOT) 8.6 MG TABS tablet Take 8.6 mg by mouth daily as needed for mild constipation.   SYNTHROID 150 MCG tablet TAKE 1 TABLET EVERY DAY ON EMPTY STOMACHWITH A  GLASS OF WATER AT LEAST 30-60 MINBEFORE BREAKFAST   trimethoprim (TRIMPEX) 100 MG tablet TAKE ONE TABLET BY MOUTH EVERY DAY (Patient taking differently: Take 100 mg by mouth daily.)   [DISCONTINUED] Pumpkin Seed (AZO MEN BLADDER CONTROL) 500 MG CAPS Take by mouth.   Immunization History  Administered Date(s) Administered   Fluad Quad(high Dose 65+) 01/02/2019, 02/10/2020   Influenza, High Dose Seasonal PF 11/16/2016, 12/18/2017   Influenza,inj,Quad PF,6+ Mos 12/16/2014, 11/24/2015   PFIZER(Purple Top)SARS-COV-2 Vaccination 05/08/2019, 05/29/2019   Pneumococcal Conjugate-13 07/08/2014   Pneumococcal Polysaccharide-23 03/13/2012   Zoster 03/13/2010       Objective:   Physical Exam BP 124/78 (BP Location: Left Arm, Cuff Size: Normal)   Pulse 80   Temp (!) 97.5 F (36.4 C) (Temporal)   Ht _0  (1.676 m)   Wt 213 lb 6.4 oz (96.8 kg)   SpO2 98%   BMI 34.44 kg/m  GENERAL: Well-developed, overweight woman, no acute distress.  Fully ambulatory. HEAD: Normocephalic, atraumatic.  EYES: Pupils equal, round, reactive to light.  No scleral icterus.  MOUTH: Nose/mouth/throat not examined due to masking requirements for COVID 19. NECK: Supple. No thyromegaly. Trachea midline. No JVD.  No adenopathy. PULMONARY: Good air entry bilaterally.  Coarse breath sounds, occasional rare wheeze. CARDIOVASCULAR: S1 and S2. Regular rate and rhythm.  No rubs, murmurs or gallops heard. ABDOMEN: Benign. MUSCULOSKELETAL: No joint deformity, no clubbing, no edema.  NEUROLOGIC: No focal deficit, no gait disturbance, speech is fluent. SKIN: Intact,warm,dry. PSYCH: Mood and behavior normal.  Representative image of CT performed 14 Jul 2020, demonstrating peripheral nodule on the left lung:       Assessment & Plan:     ICD-10-CM   1. Shortness of breath  R06.02 Pulmonary Function Test [facility] Only   Will obtain PFTs May be secondary to poorly compensated COPD Emphysema/inflammation noted on CT    2. COPD  suggested by initial evaluation ([place])  J44.9    PFTs as noted above Trial of Trelegy Ellipta    3. Nodule of left lung  R91.1    To undergo SBRT per oncology    4. History of left mastectomy  Z90.12    This issue adds complexity to her management Being followed by oncology      Meds ordered this encounter  Medications   Fluticasone-Umeclidin-Vilant (TRELEGY ELLIPTA) 100-62.5-25 MCG/INH AEPB    Sig: Inhale 1 puff into the lungs daily for 1 day.    Dispense:  14 each    Refill:  0    Order Specific Question:   Lot Number?    Answer:   [id number]    Order Specific Question:   Expiration Date?    Answer:   03/13/2022    Order Specific Question:   Manufacturer?    Answer:   [employer] [[id number]]    Order Specific Question:   Quantity    Answer:   2   Orders Placed This Encounter  Procedures   Pulmonary Function Test [facility] Only    Standing Status:   Future    Standing Expiration Date:   07/21/2021    Scheduling Instructions:     88mo  Order Specific Question:   Full PFT: includes the following: basic spirometry, spirometry pre & post bronchodilator, diffusion capacity (DLCO), lung volumes    Answer:   Full PFT   We will see the patient in follow-up in 3 months time she is to contact us prior to that time should any new difficulties arise.  Renold Don, MD [facility]   *This note was dictated using voice recognition software/Dragon.  Despite best efforts to proofread, errors can occur which can change the meaning.  Any change was purely unintentional.

## 2020-07-22 ENCOUNTER — Telehealth: Payer: Self-pay | Admitting: Pharmacist

## 2020-07-22 DIAGNOSIS — Z853 Personal history of malignant neoplasm of breast: Secondary | ICD-10-CM | POA: Diagnosis not present

## 2020-07-22 NOTE — Chronic Care Management (AMB) (Signed)
Chronic Care Management Pharmacy Assistant   Name: Tammy [name]  MRN: [id number] DOB: 1945/03/27   Reason for Encounter: Disease State Diabetes Mellitus     Recent office visits:  05/11/2020 Park Liter, DO (PCP) SeeN for Hospital follow up. Increased Trulicity to 4.5 from 3 mg. Referred to [facility] for knee pain. Follow up in 3 weeks. 02/10/2020 Park Liter ,DO (PCP) Follow up visit. Flu vaccine given. Follow up in 3 months.  Recent consult visits: 07/21/2020 Vernard Gambles, MD (Pulmonology) Seen for shortness of breath consult. Started on Trelegy 62.5*55mg 1 puff inhalation daily. Follow up in 3 months. 07/19/2020 GCharlaine Dalton MD (Oncology) Breast cancer consulation. Follow up in 3 months. 06/15/2020 Hemang Shah (Neurology) Follow up Folate deficiency. 05/27/2020 SLavonia Dana(Nephrology) Follow up Chronic kidney disease. No changes. Follow up in 6 months. 05/25/2020 LBeckey Rutter NP (Oncology) Video Visit- Seen for follow up Pleuritic chest pain. No changes. Follow up as needed. 04/20/2020 LBeckey RutterNP (Oncology) Video Visit- Seen for Pleuritic chest pain.Recommend scheduled tylenol, 1 tablet (350-500 mg) every 6-8 hours for the next 48 hour. Follow up with pulmonology. 04/13/2020 GKriste Basque MD (Oncology) Video visit- Follow up breast cancer. No changes follow up as needed. 03/24/2020 JFaythe Casa NP (Oncology) Pulmonary nodule clinic initial visit. PET scan in 1-2 weeks, Follow up with Dr. BBurlene Arntafter. 02/18/2020 SLavonia Dana MD (Nephrologist)- Follow up. No changes. Follow up in 3 months. 02/17/2020 HEarnestine Leys(Orthopedic) Out side records 02/16/2020 DLucilla Lame MD (Gastroenterology)-Follow up history of colon polyps.  01/26/2020 SLavonia Dana MD (Nephrology) Follow up chronic kidney disease. Follow up in 4 weeks.  Hospital visits:  Medication Reconciliation was completed by comparing discharge summary, patient's EMR and Pharmacy list,  and upon discussion with patient.  Admitted to the hospital on 04/17/2020 due to Sepsis. [facility] date [facility] 04[facility]18[facility]2020[facility]    [facility] None noted  Medication Changes at Hospital Discharge: None notd  Medications Discontinued at Hospital Discharge: None noted  Medications that remain the same after Hospital Discharge:??  -All other medications will remain the same.    Medications: Outpatient Encounter Medications as of 07/22/2020  Medication Sig  . anastrozole (ARIMIDEX) 1 MG tablet Take 1 tablet (1 mg total) by mouth daily.  . blood glucose meter kit and supplies KIT Dispense based on patient and insurance preference. Use one time daily as directed, E11.9  . Calcium Carb-Cholecalciferol (CALCIUM 600 + D) 600-200 MG-UNIT TABS Take 1 tablet by mouth daily.   . cetirizine (ZYRTEC) 10 MG tablet TAKE 1 TABLET BY MOUTH DAILY  . cholecalciferol (VITAMIN D3) 25 MCG (1000 UT) tablet Take 1,000 Units by mouth daily.  . cyanocobalamin (,VITAMIN B-12,) 1000 MCG/ML injection Inject 1,000 mcg into the muscle once a week. Then once a month x 4 months  . Dulaglutide (TRULICITY) 4.5 MWU/9.8JXSOPN Inject 4.5 mg as directed once a week.  . DULoxetine (CYMBALTA) 60 MG capsule TAKE 1 CAPSULE BY MOUTH DAILY (Patient taking differently: Take 120 mg by mouth daily.)  . ELIQUIS 2.5 MG TABS tablet TAKE 1 TABLET BY MOUTH TWICE DAILY (Patient taking differently: Take 2.5 mg by mouth 2 (two) times daily.)  . Fluticasone-Umeclidin-Vilant (TRELEGY ELLIPTA) 100-62.5-25 MCG/INH AEPB Inhale 1 puff into the lungs daily for 1 day.  . gabapentin (NEURONTIN) 300 MG capsule Take 1 capsule (300 mg total) by mouth 3 (three) times daily.  .Marland KitchenglipiZIDE (GLUCOTROL) 5 MG tablet TAKE ONE TABLET BY MOUTH  EVERY DAY BEFORE BREAKFAST  . insulin degludec (TRESIBA FLEXTOUCH) 100 UNIT/ML FlexTouch Pen Inject 24 Units into the skin daily.  . Insulin Pen  Needle (PEN NEEDLES 3/16") 31G X 5 MM MISC 1 each by Does not apply route daily.  . isosorbide mononitrate (IMDUR) 60 MG 24 hr tablet TAKE ONE TABLET BY MOUTH AT BEDTIME AND HALF A TABLET EVERY MORNING (Patient taking differently: Take 30-60 mg by mouth See admin instructions. Take  tablet (19m) by mouth every morning and take 1 tablet (642m by mouth every night)  . JARDIANCE 25 MG TABS tablet TAKE ONE TABLET 25MG EVERY DAY BEFORE BREAKFAST (Patient taking differently: Take 25 mg by mouth daily before breakfast.)  . metoprolol succinate (TOPROL-XL) 50 MG 24 hr tablet TAKE ONE TABLET EVERY DAY WITH OR IMMEDIATELY FOLLOWING A MEAL  . mometasone (ELOCON) 0.1 % lotion Apply topically as directed. Qd up to 5 days a week to aa scalp until clear, then prn flares  . montelukast (SINGULAIR) 10 MG tablet Take 1 tablet (10 mg total) by mouth at bedtime.  . Marland KitchenYRBETRIQ 50 MG TB24 tablet TAKE 1 TABLET BY MOUTH DAILY  . nitroGLYCERIN (NITROSTAT) 0.4 MG SL tablet Place 1 tablet (0.4 mg total) under the tongue every 5 (five) minutes as needed for chest pain.  . pantoprazole (PROTONIX) 40 MG tablet Take 1 tablet (40 mg total) by mouth daily.  . Polyethylene Glycol 400 (BLINK TEARS) 0.25 % SOLN Place 1 drop into both eyes 3 (three) times daily as needed (dry/irritated eyes.).   . Marland KitchenROAIR HFA 108 (90 Base) MCG/ACT inhaler TAKE 2 PUFFS EVERY 4 HOURS AS NEEDED FORWHEEZING OR SHORTNESS OF BREATH  . Pumpkin Seed-Soy Germ (AZO BLADDER CONTROL/GO-LESS PO) Take by mouth.  . rosuvastatin (CRESTOR) 40 MG tablet TAKE 1 TABLET BY MOUTH ONCE DAILY (Patient taking differently: Take 40 mg by mouth daily.)  . senna (SENOKOT) 8.6 MG TABS tablet Take 8.6 mg by mouth daily as needed for mild constipation.  . Marland KitchenYNTHROID 150 MCG tablet TAKE 1 TABLET EVERY DAY ON EMPTY STOMACHWITH A GLASS OF WATER AT LEAST 30-60 MINBEFORE BREAKFAST  . trimethoprim (TRIMPEX) 100 MG tablet TAKE ONE TABLET BY MOUTH EVERY DAY (Patient taking differently: Take  100 mg by mouth daily.)   No facility-administered encounter medications on file as of 07/22/2020.   Recent Relevant Labs: Lab Results  Component Value Date/Time   HGBA1C 7.7 (H) 05/11/2020 03:01 PM   HGBA1C 8.6 (H) 04/17/2020 03:40 AM   HGBA1C 7.0 (H) 02/10/2020 02:38 PM   HGBA1C 8.7 01/31/2016 12:00 AM   MICROALBUR 150 (H) 01/02/2019 10:30 AM   MICROALBUR 150 (H) 07/14/2016 04:21 PM    Kidney Function Lab Results  Component Value Date/Time   CREATININE 1.73 (H) 07/19/2020 10:19 AM   CREATININE 1.73 (H) 05/11/2020 03:04 PM   CREATININE 1.31 (H) 09/20/2011 11:34 AM   GFRNONAA 30 (L) 07/19/2020 10:19 AM   GFRNONAA 42 (L) 09/20/2011 11:34 AM   GFRAA 32 (L) 02/10/2020 02:40 PM   GFRAA 48 (L) 09/20/2011 11:34 AM    . Current antihyperglycemic regimen:  o Trulicity 4.8.5IOnject once a week o Jardiance 2550m tablet before breakfast o Glipizide 5mg58mtablet before breakfast o Tresiba Inject 24 units daily  . What recent interventions/DTPs have been made to improve glycemic control:  o Trulicity 3 mg weekly to 4.5mg 25mkly  . Have there been any recent hospitalizations or ED visits since last visit with CPP? Yes   . Patient  denies hypoglycemic symptoms, including None   . Patient reports hyperglycemic symptoms, including blurry vision and unable to focus   . How often are you checking your blood sugar? once daily   . What are your blood sugars ranging?  o Fasting: 98 07/21/20 123 07/22/20 o Before meals:  o After meals:  o Bedtime:   . During the week, how often does your blood glucose drop below 70? patient states it has only been under 70 once. Patient states this was the day of her PET Scan   . Are you checking your feet daily/regularly?   o Patient states she checks her feet daily.  Adherence Review: Is the patient currently on a STATIN medication? Yes Is the patient currently on ACE/ARB medication? No Does the patient have >5 day gap between last estimated fill  dates? No   Patient states she was recently diagnosed with lung cancer and is scheduled to start radiation the week after next.   Star Rating Drugs: Trulicity 4.5 mg Last DGLOVF:64/33/2951 28 DS Glipizide 5 mg Last filled:05/04/2020 90 DS Jardiance 25 mg Last filled:06/21/2020 30 DS Rosuvastatin 40 mg Last filled:06/05/2020 90 DS   Lizbeth Bark Clinical Pharmacist Assistant [phone]  Total time spent: 80 minutes

## 2020-07-27 ENCOUNTER — Telehealth: Payer: Self-pay

## 2020-07-27 NOTE — Telephone Encounter (Signed)
  Care Management   Follow Up Note   07/27/2020 Name: SUSIE EHRESMAN MRN: [id number] DOB: Dec 26, 1945   Referred by: [name], DO Reason for referral : Chronic Care Management (RN CM for follow up on chronic disease management and care coordination needs - 2nd attempt.)   A second unsuccessful telephone outreach was attempted today. The patient was referred to the case management team for assistance with care management and care coordination.   Follow Up Plan: A HIPPA compliant phone message was left for the patient providing contact information and requesting a return call.   Salvatore Marvel RN, Agricultural consultant Health  [employer] planner Family Practice Mobile: [phone]

## 2020-07-29 DIAGNOSIS — Z510 Encounter for antineoplastic radiation therapy: Secondary | ICD-10-CM | POA: Diagnosis not present

## 2020-07-29 DIAGNOSIS — C3412 Malignant neoplasm of upper lobe, left bronchus or lung: Secondary | ICD-10-CM | POA: Diagnosis not present

## 2020-08-02 ENCOUNTER — Ambulatory Visit
Admission: RE | Admit: 2020-08-02 | Discharge: 2020-08-02 | Disposition: A | Discharge status: Home / Self Care | Payer: Medicare Other | Source: Ambulatory Visit | Attending: Radiation Oncology | Admitting: Radiation Oncology

## 2020-08-02 DIAGNOSIS — C3412 Malignant neoplasm of upper lobe, left bronchus or lung: Secondary | ICD-10-CM | POA: Diagnosis not present

## 2020-08-02 DIAGNOSIS — Z87891 Personal history of nicotine dependence: Secondary | ICD-10-CM | POA: Diagnosis not present

## 2020-08-02 DIAGNOSIS — Z510 Encounter for antineoplastic radiation therapy: Secondary | ICD-10-CM | POA: Diagnosis not present

## 2020-08-04 ENCOUNTER — Ambulatory Visit
Admission: RE | Admit: 2020-08-04 | Discharge: 2020-08-04 | Disposition: A | Discharge status: Home / Self Care | Payer: Medicare Other | Source: Ambulatory Visit | Attending: Radiation Oncology | Admitting: Radiation Oncology

## 2020-08-04 ENCOUNTER — Ambulatory Visit (INDEPENDENT_AMBULATORY_CARE_PROVIDER_SITE_OTHER): Payer: Medicare Other | Admitting: Pharmacist

## 2020-08-04 DIAGNOSIS — I10 Essential (primary) hypertension: Secondary | ICD-10-CM

## 2020-08-04 DIAGNOSIS — Z510 Encounter for antineoplastic radiation therapy: Secondary | ICD-10-CM | POA: Diagnosis not present

## 2020-08-04 DIAGNOSIS — N183 Chronic kidney disease, stage 3 unspecified: Secondary | ICD-10-CM

## 2020-08-04 DIAGNOSIS — Z794 Long term (current) use of insulin: Secondary | ICD-10-CM

## 2020-08-04 DIAGNOSIS — E1122 Type 2 diabetes mellitus with diabetic chronic kidney disease: Secondary | ICD-10-CM

## 2020-08-04 DIAGNOSIS — C3412 Malignant neoplasm of upper lobe, left bronchus or lung: Secondary | ICD-10-CM | POA: Diagnosis not present

## 2020-08-04 NOTE — Progress Notes (Signed)
Chronic Care Management Pharmacy Note  08/10/2020 Name:  Tammy [name] MRN:  [id number] DOB:  [birth year]  Subjective: Tammy [name] is an 75 y.o. year old female who is a primary patient of [name], DO.  The CCM team was consulted for assistance with disease management and care coordination needs.    Engaged with patient by telephone for follow up visit in response to provider referral for pharmacy case management and/or care coordination services.   Consent to Services:  The patient was given information about Chronic Care Management services, agreed to services, and gave verbal consent prior to initiation of services.  Please see initial visit note for detailed documentation.   Patient Care Team: [name], DO as PCP - General (Family Medicine) [name] [name], MD as PCP - Cardiology (Cardiology) [name], MD as Consulting Physician (Cardiology) [name], MD as Consulting Physician (Urology) [name], MD as Consulting Physician (Nephrology) [name], MD as Referring Physician (Orthopedic Surgery) [name], MD as Attending Physician (Endocrinology) [name], MD as Referring Physician (Vascular Surgery) [name], [facility] as Pharmacist (Pharmacist) [name], RN as Case Manager (General Practice)  Recent office visits: 05/11/2020 Park Liter, DO (PCP) SeeN for Hospital follow up. Increased Trulicity to 4.5 from 3 mg. Referred to [facility] for knee pain. Follow up in 3 weeks  Recent consult visits: 07/21/2020 Vernard Gambles, MD (Pulmonology) Seen for shortness of breath consult. Started on Trelegy 62.5/25mcg 1 puff inhalation daily. Follow up in 3 months. 07/19/2020 Charlaine Dalton, MD (Oncology) Breast cancer consulation. Follow up in 3 months. 06/15/2020 Hemang Shah (Neurology) Follow up Folate deficiency. 05/27/2020 [name] (Nephrology) Follow up Chronic kidney disease. No changes. Follow up in 6  months. 05/25/2020 Beckey Rutter, NP (Oncology) Video Visit- Seen for follow up Pleuritic chest pain. No changes. Follow up as needed. 04/20/2020 Beckey Rutter NP (Oncology) Video Visit- Seen for Pleuritic chest pain.Recommend scheduled tylenol, 1 tablet (350-500 mg) every 6-8 hours for the next 48 hour. Follow up with pulmonology. 04/13/2020 Kriste Basque, MD (Oncology) Video visit- Follow up breast cancer. No changes follow up as needed. 03/24/2020 Faythe Casa, NP (Oncology) Pulmonary nodule clinic initial visit. PET scan in 1-2 weeks, Follow up with Dr. Burlene Arnt after. 02/18/2020 [name], MD (Nephrologist)- Follow up. No changes. Follow up in 3 months. 02/17/2020 Earnestine Leys (Orthopedic) Out side records 02/16/2020 [name], MD (Gastroenterology)-Follow up history of colon polyps.  01/26/2020 [name], MD (Nephrology) Follow up chronic kidney disease. Follow up in 4 weeks.  Hospital visits: 04/17/20--2/06/22[facility] s/p fall    Objective:  Lab Results  Component Value Date   CREATININE 1.73 (H) 07/19/2020   BUN 34 (H) 07/19/2020   GFRNONAA 30 (L) 07/19/2020   GFRAA 32 (L) 02/10/2020   NA 136 07/19/2020   K 4.9 07/19/2020   CALCIUM 9.0 07/19/2020   CO2 23 07/19/2020   GLUCOSE 173 (H) 07/19/2020    Lab Results  Component Value Date/Time   HGBA1C 7.7 (H) 05/11/2020 03:01 PM   HGBA1C 8.6 (H) 04/17/2020 03:40 AM   HGBA1C 7.0 (H) 02/10/2020 02:38 PM   HGBA1C 8.7 01/31/2016 12:00 AM   MICROALBUR 150 (H) 01/02/2019 10:30 AM   MICROALBUR 150 (H) 07/14/2016 04:21 PM    Last diabetic Eye exam:  Lab Results  Component Value Date/Time   HMDIABEYEEXA No Retinopathy 07/02/2019 12:00 AM    Last diabetic Foot exam: No results found for: HMDIABFOOTEX  Lab Results  Component Value Date   CHOL 122 11/11/2019   HDL 40 11/11/2019   LDLCALC 53 11/11/2019   TRIG 175 (H) 11/11/2019   CHOLHDL 3.2 09/07/2015    Hepatic Function Latest Ref Rng & Units 07/19/2020  05/11/2020 04/17/2020  Total Protein 6.5 - 8.1 g/dL 7.0 7.0 6.8  Albumin 3.5 - 5.0 g/dL 3.6 4.3 3.3(L)  AST 15 - 41 U/L 14(L) 15 15  ALT 0 - 44 U/L _0 Alk Phosphatase 38 - 126 U/L 75 97 68  Total Bilirubin 0.3 - 1.2 mg/dL 0.8 0.4 0.8  Bilirubin, Direct 0.0 - 0.2 mg/dL - - 0.1    Lab Results  Component Value Date/Time   TSH 0.865 11/11/2019 02:24 PM   TSH 4.680 (H) 05/20/2019 02:07 PM    CBC Latest Ref Rng & Units 07/19/2020 05/11/2020 04/18/2020  WBC 4.0 - 10.5 K/uL 8.9 11.4(H) 14.2(H)  Hemoglobin 12.0 - 15.0 g/dL 12.7 13.1 11.8(L)  Hematocrit 36.0 - 46.0 % 41.0 43.0 38.0  Platelets 150 - 400 K/uL 285 352 224    Lab Results  Component Value Date/Time   VD25OH 32.6 04/08/2019 04:38 PM    Clinical ASCVD: Yes  The ASCVD Risk score Mikey Bussing DC Jr., et al., 2013) failed to calculate for the following reasons:   The valid total cholesterol range is 130 to 320 mg/dL    Depression screen [facility] 2/9 12/19/2019 12/17/2019 11/11/2019  Decreased Interest 0 0 0  Down, Depressed, Hopeless 0 0 0  PHQ - 2 Score 0 0 0  Altered sleeping - 0 0  Tired, decreased energy - 0 0  Change in appetite - 0 0  Feeling bad or failure about yourself  - 0 0  Trouble concentrating - 0 0  Moving slowly or fidgety/restless - 0 0  Suicidal thoughts - 0 0  PHQ-9 Score - 0 0  Difficult doing work/chores - - Not difficult at all  Some recent data might be hidden       Social History   Tobacco Use  Smoking Status Former Research scientist (life sciences)  . Packs/day: 0.50  . Years: 15.00  . Pack years: 7.50  . Types: Cigarettes  . Quit date: 03/14/2007  . Years since quitting: 13.4  Smokeless Tobacco Never Used   BP Readings from Last 3 Encounters:  07/21/20 124/78  07/19/20 130/81  06/24/20 (!) (P) 152/64   Pulse Readings from Last 3 Encounters:  07/21/20 80  07/19/20 71  06/24/20 (P) 91   Wt Readings from Last 3 Encounters:  07/21/20 213 lb 6.4 oz (96.8 kg)  07/19/20 208 lb (94.3 kg)  06/24/20 (P) 212 lb 11.2 oz (96.5  kg)   BMI Readings from Last 3 Encounters:  07/21/20 34.44 kg/m  07/19/20 33.57 kg/m  06/24/20 (P) 34.33 kg/m    Assessment/Interventions: Review of patient past medical history, allergies, medications, health status, including review of consultants reports, laboratory and other test data, was performed as part of comprehensive evaluation and provision of chronic care management services.   SDOH:  (Social Determinants of Health) assessments and interventions performed: No  SDOH Screenings   Alcohol Screen: Not on file  Depression (PHQ2-9): Low Risk   . PHQ-2 Score: 0  Financial Resource Strain: Low Risk   . Difficulty of Paying Living Expenses: Not hard at all  Food Insecurity: No Food Insecurity  . Worried About Charity fundraiser in the Last Year: Never true  . Ran Out of Food in the  Last Year: Never true  Housing: Low Risk   . Last Housing Risk Score: 0  Physical Activity: Sufficiently Active  . Days of Exercise per Week: 7 days  . Minutes of Exercise per Session: 60 min  Social Connections: Socially Integrated  . Frequency of Communication with Friends and Family: More than three times a week  . Frequency of Social Gatherings with Friends and Family: More than three times a week  . Attends Religious Services: More than 4 times per year  . Active Member of Clubs or Organizations: Yes  . Attends Archivist Meetings: 1 to 4 times per year  . Marital Status: Married  Stress: No Stress Concern Present  . Feeling of Stress : Not at all  Tobacco Use: Medium Risk  . Smoking Tobacco Use: Former Smoker  . Smokeless Tobacco Use: Never Used  Transportation Needs: No Transportation Needs  . Lack of Transportation (Medical): No  . Lack of Transportation (Non-Medical): No      Immunization History  Administered Date(s) Administered  . Fluad Quad(high Dose 65+) 01/02/2019, 02/10/2020  . Influenza, High Dose Seasonal PF 11/16/2016, 12/18/2017  . Influenza,inj,Quad  PF,6+ Mos 12/16/2014, 11/24/2015  . PFIZER(Purple Top)SARS-COV-2 Vaccination 05/08/2019, 05/29/2019  . Pneumococcal Conjugate-13 07/08/2014  . Pneumococcal Polysaccharide-23 03/13/2012  . Zoster, Live 03/13/2010    Conditions to be addressed/monitored:  Hypertension, Hyperlipidemia, Diabetes, Heart Failure, Coronary Artery Disease, COPD, Asthma, Chronic Kidney Disease and lung cancer, PVD, h/o PE, CVA. Stage 1 ERPRpos/HER2 neg breast cancer Left, h/o total knee replacement  Care Plan : CCM Pharmacy Care plan  Updates made by [name], RPH since 08/10/2020 12:00 AM    Problem: DM, CKD,  HTN, HD, hypothyroidism, H/o PE/DVT, overactive bladder, CAD, PVD     Long-Range Goal: disease management   Start Date: 08/04/2020  This Visit's Progress: On track  Priority: High  Note:   Current Barriers:  . Unable to independently monitor therapeutic efficacy . Unable to maintain control of diabetes . Suboptimal therapeutic regimen for daibetes . Does not contact provider office for questions/concerns  Pharmacist Clinical Goal(s):  [name] Patient will achieve adherence to monitoring guidelines and medication adherence to achieve therapeutic efficacy . maintain control of diabetes as evidenced by A1c, home readings  . achieve ability to self administer medications as prescribed through use of pill box as evidenced by patient report . contact provider office for questions/concerns as evidenced notation of same in electronic health record through collaboration with PharmD and provider.   Interventions: . 1:1 collaboration with [name], DO regarding development and update of comprehensive plan of care as evidenced by provider attestation and co-signature . Inter-disciplinary care team collaboration (see longitudinal plan of care) . Comprehensive medication review performed; medication list updated in electronic medical record  Lab Results  Component Value Date   HGBA1C 7.7 (H) 05/11/2020     Diabetes (A1c goal <7%) -Not ideally controlled -Current medications: ? Trulicity 7.7AJ inject once a week ? Jardiance 24m 1 tablet before breakfast ? Glipizide 579m1 tablet before breakfast ? Tresiba Inject 24 units daily  -Medications previously tried: NA -Current home glucose readings . fasting glucose: 98-130 ususally, 160 this am . post prandial glucose: not checking -Reports hypoglycemic/hyperglycemic symptoms -Educated on A1c and blood sugar goals; Complications of diabetes including kidney damage, retinal damage, and cardiovascular disease; Exercise goal of 150 minutes per week; Prevention and management of hypoglycemic episodes; Benefits of routine self-monitoring of blood sugar; -Counseled to check feet daily  and get yearly eye exams -Counseled on diet and exercise extensively Recommended discontinue glipizide given age, risk of hypoglycemia and history of falls. Educated on 15/15 rule for hypoglycemia.  Encouraged patient to maintain glucose tablets on hand for epsiodes. reports only one on day of PET scan.  Lab Results  Component Value Date   CHOL 122 11/11/2019   HDL 40 11/11/2019   LDLCALC 53 11/11/2019   TRIG 175 (H) 11/11/2019   CHOLHDL 3.2 09/07/2015    Hyperlipidemia: (LDL goal < 70) -Controlled -Current treatment: . Rosuvastatin 40 mg qd -Medications previously tried: na  --Educated on Cholesterol goals;  Benefits of statin for ASCVD risk reduction; Exercise goal of 150 minutes per week; -Counseled on diet and exercise extensively Recommended to continue current medication BP Readings from Last 3 Encounters:  07/21/20 124/78  07/19/20 130/81  06/24/20 (!) (P) 152/64   Lab Results  Component Value Date   CREATININE 1.73 (H) 07/19/2020  eGFr ~45m/min Hypertension/CKD IIIb (BP goal <130/80) -Controlled -Current treatment: . Metoprolol succinate 50 mg qd . Isosorbide mononitrate 30 mg qam, 60 mg qpm -Medications previously tried:  losartan- hyperkalemia -Current home readings: hasn't checked recently --Denies hypotensive/hypertensive symptoms -Educated on BP goals and benefits of medications for prevention of heart attack, stroke and kidney damage; Daily salt intake goal < 2300 mg; Importance of home blood pressure monitoring; Proper BP monitoring technique; -Counseled to monitor BP at home three times weekly, document, and provide log at future appointments -Recommended to continue current medication  COPD/ New lung cancer diagnosis  (Goal: control symptoms and prevent exacerbations) -Controlled -Current treatment  . Trelegy 1 puff qd . Albuterol 2 puffs q4 h prn . Montelukast sodiume 10 mg qpm . Radiation therapy , 3 sessions remain -Medications previously tried: NA  -Gold Grade:  Need PFTS  -Current COPD Classification:  A (low sx, <2 exacerbations/yr) -Pulmonary function testing: ordered by Dr. GDuwayne Heck-Exacerbations requiring treatment in last 6 months: 0 -Patient reports consistent use of maintenance inhaler -Frequency of rescue inhaler use: infrequently -Counseled on Proper inhaler technique; Benefits of consistent maintenance inhaler use When to use rescue inhaler -Recommended to continue current medication   LUL Lung cancer new DX / Breast cancer s/p lumpectomy (Goal: remission) -Controlled -Current treatment  . Radiation Txy . arimidex 1 mg qd -Medications previously tried: NA -Recommended to continue current medication  Patient had radiation therapy today and tolerated well. Three sessions remain. Recent mammogram WNL. Lung cancer believed unrelated to breast  Overactive bladder H/O frequent UTIS (Goal: reduce symptoms) -Controlled -Current treatment  . Myrbetriq 50 mg qd . AZO bladder control . Trimethoprim 100 mg qd -Medications previously tried: Uribel -Assessed ingredient list of AZO bladder control for interactions. None identified.Patient reports exceelent response and ability  to discontinue incontinence undergarments. Recommended patient inform all prescribers she is taking.  H/O PE/DVT (Goal: prevent recurrence) -Controlled -Current treatment  . Eliquis 2.5 mg bid -Medications previously tried: na  -Recommended to continue current medication Educated on signs/symptoms of bleeding, avoiding NSAIDS or OTC supplements without discussing with provider or pharmacist.    Patient Goals/Self-Care Activities . Patient will:  - take medications as prescribed check glucose daily, document, and provide at future appointments check blood pressure 3 times weekly, document, and provide at future appointments collaborate with provider on medication access solutions target a minimum of 150 minutes of moderate intensity exercise weekly  Follow Up Plan: Telephone follow up appointment with care management team member scheduled for: 1 month CPA, 3  months PharmD      Medication Assistance: Patient will call if copays cost prohibitive at next refill.  Compliance/Adherence/Medication fill history: Care Gaps:   Star-Rating Drugs: Trulicity empagliflozin Tyler Aas roseuvastatin glipizide  Patient's preferred pharmacy is:  [facility] - [place], [place] [address] Phone: [phone] Fax: [phone]  Uses pill box? Yes Pt endorses 90% compliance  We discussed: Benefits of medication synchronization, packaging and delivery as well as enhanced pharmacist oversight with [employer]. Patient decided to: Continue current medication management strategy  Care Plan and Follow Up Patient Decision:  Patient agrees to Care Plan and Follow-up.  Plan: Telephone follow up appointment with care management team member scheduled for:  1 month CPA, 3 mopnths PharmD  Junita Push. Kenton Kingfisher PharmD, [place] [phone]

## 2020-08-10 ENCOUNTER — Ambulatory Visit
Admission: RE | Admit: 2020-08-10 | Discharge: 2020-08-10 | Disposition: A | Discharge status: Home / Self Care | Payer: Medicare Other | Source: Ambulatory Visit | Attending: Radiation Oncology | Admitting: Radiation Oncology

## 2020-08-10 DIAGNOSIS — C3412 Malignant neoplasm of upper lobe, left bronchus or lung: Secondary | ICD-10-CM | POA: Diagnosis not present

## 2020-08-10 DIAGNOSIS — Z510 Encounter for antineoplastic radiation therapy: Secondary | ICD-10-CM | POA: Diagnosis not present

## 2020-08-10 NOTE — Patient Instructions (Addendum)
Visit Information  It was a pleasure speaking with you today. Thank you for letting me be part of your clinical team. Please call with any questions or concerns.   Goals Addressed            This Visit's Progress   . Improve My Heart Health-Coronary Artery Disease       Timeframe:  Long-Range Goal Priority:  High Start Date:                             Expected End Date:                       Follow Up Date 2 month follow up   - be open to making changes - I can manage, know and watch for signs of a heart attack - if I have chest pain, call for help - learn about small changes that will make a big difference    Why is this important?    Lifestyle changes are key to improving the blood flow to your heart. Think about the things you can change and set a goal to live healthy.   Remember, when the blood vessels to your heart start to get clogged you may not have any symptoms.   Over time, they can get worse.   Don't ignore the signs, like chest pain, and get help right away.     Notes:     . Obtain Eye Exam-Diabetes Type 2       Follow Up Date 2 month follow up    - schedule appointment with eye doctor    Why is this important?    Eye check-ups are important when you have diabetes.   Vision loss can be prevented.    Notes:        The patient verbalized understanding of instructions, educational materials, and care plan provided today and agreed to receive a mailed copy of patient instructions, educational materials, and care plan.   Telephone follow up appointment with pharmacy team member scheduled for:  2 motnhs  Julie S. Harris PharmD, BCPS Clinical Pharmacist [phone]  Preventing Hypoglycemia Hypoglycemia occurs when the level of sugar (glucose) in the blood is too low. Hypoglycemia can happen in people who do or do not have diabetes (diabetes mellitus). It can develop quickly, and it can be a medical emergency. For most people with diabetes, a blood  glucose level below 70 mg/dL (3.9 mmol/L) is considered hypoglycemia. Glucose is a type of sugar that provides the body's main source of energy. Certain hormones (insulin and glucagon) control the level of glucose in the blood. Insulin lowers blood glucose, and glucagon increases blood glucose. Hypoglycemia can result from having too much insulin in the bloodstream, or from not eating enough food that contains glucose. Your risk for hypoglycemia is higher:  If you take insulin or diabetes medicines to help lower your blood glucose or help your body make more insulin.  If you skip or delay a meal or snack.  If you are ill.  During and after exercise. You can prevent hypoglycemia by working with your health care provider to adjust your meal plan as needed and by taking other precautions. How can hypoglycemia affect me? Mild symptoms Mild hypoglycemia may not cause any symptoms. If you do have symptoms, they may include:  Hunger.  Anxiety.  Sweating and feeling clammy.  Dizziness or feeling light-headed.  Sleepiness.    Nausea.  Increased heart rate.  Headache.  Blurry vision.  Irritability.  Tingling or numbness around the mouth, lips, or tongue.  A change in coordination.  Restless sleep. If mild hypoglycemia is not recognized and treated, it can quickly become moderate or severe hypoglycemia. Moderate symptoms Moderate hypoglycemia can cause:  Mental confusion and poor judgment.  Behavior changes.  Weakness.  Irregular heartbeat. Severe symptoms Severe hypoglycemia is a medical emergency. It can cause:  Fainting.  Seizures.  Loss of consciousness (coma).  Death. What nutrition changes can be made?  Work with your health care provider or diet and nutrition specialist (dietitian) to make a healthy meal plan that is right for you. Follow your meal plan carefully.  Eat meals at regular times.  If recommended by your health care provider, have snacks  between meals.  Donot skip or delay meals or snacks. You can be at risk for hypoglycemia if you are not getting enough carbohydrates. What lifestyle changes can be made?  Work closely with your health care provider to manage your blood glucose. Make sure you know: ? Your goal blood glucose levels. ? How and when to check your blood glucose. ? The symptoms of hypoglycemia. It is important to treat it right away to keep it from becoming severe.  Do not drink alcohol on an empty stomach.  When you are ill, check your blood glucose more often than usual. Follow your sick day plan whenever you cannot eat or drink normally. Make this plan in advance with your health care provider.  Always check your blood glucose before, during, and after exercise.   How is this treated? This condition can often be treated by immediately eating or drinking something that contains sugar, such as:  Fruit juice, 4-6 oz (120-150 mL).  Regular (not diet) soda, 4-6 oz (120-150 mL).  Low-fat milk, 4 oz (120 mL).  Several pieces of hard candy.  Sugar or honey, 1 Tbsp (15 mL). Treating hypoglycemia if you have diabetes If you are alert and able to swallow safely, follow the 15:15 rule:  Take 15 grams of a rapid-acting carbohydrate. Talk with your health care provider about how much you should take.  Rapid-acting options include: ? Glucose pills (take 15 grams). ? 6-8 pieces of hard candy. ? 4-6 oz (120-150 mL) of fruit juice. ? 4-6 oz (120-150 mL) of regular (not diet) soda.  Check your blood glucose 15 minutes after you take the carbohydrate.  If the repeat blood glucose level is still at or below 70 mg/dL (3.9 mmol/L), take 15 grams of a carbohydrate again.  If your blood glucose level does not increase above 70 mg/dL (3.9 mmol/L) after 3 tries, seek emergency medical care.  After your blood glucose level returns to normal, eat a meal or a snack within 1 hour. Treating severe hypoglycemia Severe  hypoglycemia is when your blood glucose level is at or below 54 mg/dL (3 mmol/L). Severe hypoglycemia is a medical emergency. Get medical help right away. If you have severe hypoglycemia and you cannot eat or drink, you may need an injection of glucagon. A family member or close friend should learn how to check your blood glucose and how to give you a glucagon injection. Ask your health care provider if you need to have an emergency glucagon injection kit available. Severe hypoglycemia may need to be treated in a hospital. The treatment may include getting glucose through an IV. You may also need treatment for the cause of your hypoglycemia.   Where to find more information  American Diabetes Association: www.diabetes.[employer] of Diabetes and Digestive and Kidney Diseases: [employer] Contact a health care provider if:  You have problems keeping your blood glucose in your target range.  You have frequent episodes of hypoglycemia. Get help right away if:  You continue to have hypoglycemia symptoms after eating or drinking something containing glucose.  Your blood glucose level is at or below 54 mg/dL (3 mmol/L).  You faint.  You have a seizure. These symptoms may represent a serious problem that is an emergency. Do not wait to see if the symptoms will go away. Get medical help right away. Call your local emergency services (911 in the [place] Summary  Know the symptoms of hypoglycemia, and when you are at risk for it (such as during exercise or when you are sick). Check your blood glucose often when you are at risk for hypoglycemia.  Hypoglycemia can develop quickly, and it can be dangerous if it is not treated right away. If you have a history of severe hypoglycemia, make sure you know how to use your glucagon injection kit.  Make sure you know how to treat hypoglycemia. Keep a carbohydrate snack available when you may be at risk for hypoglycemia. This information is  not intended to replace advice given to you by your health care provider. Make sure you discuss any questions you have with your health care provider. Document Revised: 06/21/2018 Document Reviewed: 10/25/2016 Elsevier Patient Education  2021 [employer].  Hypertension, Adult Hypertension is another name for high blood pressure. High blood pressure forces your heart to work harder to pump blood. This can cause problems over time. There are two numbers in a blood pressure reading. There is a top number (systolic) over a bottom number (diastolic). It is best to have a blood pressure that is below 120/80. Healthy choices can help lower your blood pressure, or you may need medicine to help lower it. What are the causes? The cause of this condition is not known. Some conditions may be related to high blood pressure. What increases the risk?  Smoking.  Having type 2 diabetes mellitus, high cholesterol, or both.  Not getting enough exercise or physical activity.  Being overweight.  Having too much fat, sugar, calories, or salt (sodium) in your diet.  Drinking too much alcohol.  Having long-term (chronic) kidney disease.  Having a family history of high blood pressure.  Age. Risk increases with age.  Race. You may be at higher risk if you are African American.  Gender. Men are at higher risk than women before age 49. After age 93, women are at higher risk than men.  Having obstructive sleep apnea.  Stress. What are the signs or symptoms?  High blood pressure may not cause symptoms. Very high blood pressure (hypertensive crisis) may cause: ? Headache. ? Feelings of worry or nervousness (anxiety). ? Shortness of breath. ? Nosebleed. ? A feeling of being sick to your stomach (nausea). ? Throwing up (vomiting). ? Changes in how you see. ? Very bad chest pain. ? Seizures. How is this treated?  This condition is treated by making healthy lifestyle changes, such as: ? Eating  healthy foods. ? Exercising more. ? Drinking less alcohol.  Your health care provider may prescribe medicine if lifestyle changes are not enough to get your blood pressure under control, and if: ? Your top number is above 130. ? Your bottom number is above 80.  Your personal target blood  pressure may vary. Follow these instructions at home: Eating and drinking  If told, follow the DASH eating plan. To follow this plan: ? Fill one half of your plate at each meal with fruits and vegetables. ? Fill one fourth of your plate at each meal with whole grains. Whole grains include whole-wheat pasta, brown rice, and whole-grain bread. ? Eat or drink low-fat dairy products, such as skim milk or low-fat yogurt. ? Fill one fourth of your plate at each meal with low-fat (lean) proteins. Low-fat proteins include fish, chicken without skin, eggs, beans, and tofu. ? Avoid fatty meat, cured and processed meat, or chicken with skin. ? Avoid pre-made or processed food.  Eat less than 1,500 mg of salt each day.  Do not drink alcohol if: ? Your doctor tells you not to drink. ? You are pregnant, may be pregnant, or are planning to become pregnant.  If you drink alcohol: ? Limit how much you use to:  0-1 drink a day for women.  0-2 drinks a day for men. ? Be aware of how much alcohol is in your drink. In the [place] one drink equals one 12 oz bottle of beer (355 mL), one 5 oz glass of wine (148 mL), or one 1 oz glass of hard liquor (44 mL).   Lifestyle  Work with your doctor to stay at a healthy weight or to lose weight. Ask your doctor what the best weight is for you.  Get at least 30 minutes of exercise most days of the week. This may include walking, swimming, or biking.  Get at least 30 minutes of exercise that strengthens your muscles (resistance exercise) at least 3 days a week. This may include lifting weights or doing Pilates.  Do not use any products that contain nicotine or tobacco, such as  cigarettes, e-cigarettes, and chewing tobacco. If you need help quitting, ask your doctor.  Check your blood pressure at home as told by your doctor.  Keep all follow-up visits as told by your doctor. This is important.   Medicines  Take over-the-counter and prescription medicines only as told by your doctor. Follow directions carefully.  Do not skip doses of blood pressure medicine. The medicine does not work as well if you skip doses. Skipping doses also puts you at risk for problems.  Ask your doctor about side effects or reactions to medicines that you should watch for. Contact a doctor if you:  Think you are having a reaction to the medicine you are taking.  Have headaches that keep coming back (recurring).  Feel dizzy.  Have swelling in your ankles.  Have trouble with your vision. Get help right away if you:  Get a very bad headache.  Start to feel mixed up (confused).  Feel weak or numb.  Feel faint.  Have very bad pain in your: ? Chest. ? Belly (abdomen).  Throw up more than once.  Have trouble breathing. Summary  Hypertension is another name for high blood pressure.  High blood pressure forces your heart to work harder to pump blood.  For most people, a normal blood pressure is less than 120/80.  Making healthy choices can help lower blood pressure. If your blood pressure does not get lower with healthy choices, you may need to take medicine. This information is not intended to replace advice given to you by your health care provider. Make sure you discuss any questions you have with your health care provider. Document Revised: 11/07/2017 Document Reviewed: 11/07/2017   Elsevier Patient Education  2021 [employer].

## 2020-08-11 ENCOUNTER — Ambulatory Visit: Payer: Medicare Other

## 2020-08-11 ENCOUNTER — Telehealth: Payer: Self-pay | Admitting: Pulmonary Disease

## 2020-08-11 MED ORDER — TRELEGY ELLIPTA 100-62.5-25 MCG/INH IN AEPB: 1.0000 | INHALATION_SPRAY | Freq: Every day | RESPIRATORY_TRACT | 11 refills | Status: AC

## 2020-08-11 MED ORDER — TRELEGY ELLIPTA 100-62.5-25 MCG/INH IN AEPB: 1.0000 | INHALATION_SPRAY | Freq: Every day | RESPIRATORY_TRACT | 11 refills | Status: DC

## 2020-08-11 NOTE — Telephone Encounter (Signed)
Spoke to patient, who is requesting Rx for Trelegy 100, as she feels that this medication is effective.  Rx has been sent to preferred pharmacy.  Nothing further needed at this time.

## 2020-08-12 ENCOUNTER — Ambulatory Visit
Admission: RE | Admit: 2020-08-12 | Discharge: 2020-08-12 | Disposition: A | Discharge status: Home / Self Care | Payer: Medicare Other | Source: Ambulatory Visit | Attending: Radiation Oncology | Admitting: Radiation Oncology

## 2020-08-12 DIAGNOSIS — C3412 Malignant neoplasm of upper lobe, left bronchus or lung: Secondary | ICD-10-CM | POA: Insufficient documentation

## 2020-08-12 DIAGNOSIS — Z510 Encounter for antineoplastic radiation therapy: Secondary | ICD-10-CM | POA: Insufficient documentation

## 2020-08-16 ENCOUNTER — Ambulatory Visit: Payer: Medicare Other

## 2020-08-17 ENCOUNTER — Ambulatory Visit
Admission: RE | Admit: 2020-08-17 | Discharge: 2020-08-17 | Disposition: A | Discharge status: Home / Self Care | Payer: Medicare Other | Source: Ambulatory Visit | Attending: Radiation Oncology | Admitting: Radiation Oncology

## 2020-08-17 DIAGNOSIS — S86911A Strain of unspecified muscle(s) and tendon(s) at lower leg level, right leg, initial encounter: Secondary | ICD-10-CM | POA: Diagnosis not present

## 2020-08-17 DIAGNOSIS — Z96651 Presence of right artificial knee joint: Secondary | ICD-10-CM | POA: Diagnosis not present

## 2020-08-17 DIAGNOSIS — Z87891 Personal history of nicotine dependence: Secondary | ICD-10-CM | POA: Diagnosis not present

## 2020-08-17 DIAGNOSIS — C3412 Malignant neoplasm of upper lobe, left bronchus or lung: Secondary | ICD-10-CM | POA: Diagnosis not present

## 2020-08-17 DIAGNOSIS — Z510 Encounter for antineoplastic radiation therapy: Secondary | ICD-10-CM | POA: Diagnosis not present

## 2020-08-18 ENCOUNTER — Ambulatory Visit: Payer: Medicare Other

## 2020-08-19 ENCOUNTER — Other Ambulatory Visit: Payer: Self-pay

## 2020-08-19 ENCOUNTER — Encounter: Payer: Self-pay | Admitting: Pulmonary Disease

## 2020-08-19 ENCOUNTER — Encounter: Payer: Self-pay | Admitting: Family Medicine

## 2020-08-19 ENCOUNTER — Ambulatory Visit (INDEPENDENT_AMBULATORY_CARE_PROVIDER_SITE_OTHER): Payer: Medicare Other | Admitting: Family Medicine

## 2020-08-19 VITALS — BP 105/70 | HR 77 | Temp 98.2°F | Ht 66.0 in | Wt 213.0 lb

## 2020-08-19 DIAGNOSIS — E782 Mixed hyperlipidemia: Secondary | ICD-10-CM

## 2020-08-19 DIAGNOSIS — N1831 Chronic kidney disease, stage 3a: Secondary | ICD-10-CM

## 2020-08-19 DIAGNOSIS — I2699 Other pulmonary embolism without acute cor pulmonale: Secondary | ICD-10-CM

## 2020-08-19 DIAGNOSIS — I25110 Atherosclerotic heart disease of native coronary artery with unstable angina pectoris: Secondary | ICD-10-CM | POA: Diagnosis not present

## 2020-08-19 DIAGNOSIS — E1122 Type 2 diabetes mellitus with diabetic chronic kidney disease: Secondary | ICD-10-CM

## 2020-08-19 DIAGNOSIS — N3000 Acute cystitis without hematuria: Secondary | ICD-10-CM

## 2020-08-19 DIAGNOSIS — Z794 Long term (current) use of insulin: Secondary | ICD-10-CM | POA: Diagnosis not present

## 2020-08-19 DIAGNOSIS — R300 Dysuria: Secondary | ICD-10-CM

## 2020-08-19 DIAGNOSIS — D649 Anemia, unspecified: Secondary | ICD-10-CM | POA: Diagnosis not present

## 2020-08-19 DIAGNOSIS — E039 Hypothyroidism, unspecified: Secondary | ICD-10-CM

## 2020-08-19 DIAGNOSIS — R911 Solitary pulmonary nodule: Secondary | ICD-10-CM

## 2020-08-19 DIAGNOSIS — N183 Chronic kidney disease, stage 3 unspecified: Secondary | ICD-10-CM | POA: Diagnosis not present

## 2020-08-19 DIAGNOSIS — I129 Hypertensive chronic kidney disease with stage 1 through stage 4 chronic kidney disease, or unspecified chronic kidney disease: Secondary | ICD-10-CM | POA: Diagnosis not present

## 2020-08-19 DIAGNOSIS — J449 Chronic obstructive pulmonary disease, unspecified: Secondary | ICD-10-CM | POA: Diagnosis not present

## 2020-08-19 LAB — URINALYSIS, ROUTINE W REFLEX MICROSCOPIC
Bilirubin, UA: NEGATIVE
Ketones, UA: NEGATIVE
Nitrite, UA: NEGATIVE
Specific Gravity, UA: 1.025 (ref 1.005–1.030)
Urobilinogen, Ur: 0.2 mg/dL (ref 0.2–1.0)
pH, UA: 5 (ref 5.0–7.5)

## 2020-08-19 LAB — MICROSCOPIC EXAMINATION: WBC, UA: >30 /hpf — AB (ref 0–5)

## 2020-08-19 LAB — MICROALBUMIN, URINE WAIVED
Creatinine, Urine Waived: 200 mg/dL (ref 10–300)
Microalb, Ur Waived: 150 mg/L — ABNORMAL HIGH (ref 0–19)

## 2020-08-19 LAB — BAYER DCA HB A1C WAIVED: HB A1C (BAYER DCA - WAIVED): 7.9 % — ABNORMAL HIGH (ref <7.0)

## 2020-08-19 MED ORDER — PROAIR HFA 108 (90 BASE) MCG/ACT IN AERS: INHALATION_SPRAY | RESPIRATORY_TRACT | 3 refills | Status: DC

## 2020-08-19 MED ORDER — EMPAGLIFLOZIN 25 MG PO TABS: 25.0000 mg | ORAL_TABLET | Freq: Every day | ORAL | 1 refills | Status: DC

## 2020-08-19 MED ORDER — DULOXETINE HCL 60 MG PO CPEP: 120.0000 mg | ORAL_CAPSULE | Freq: Every day | ORAL | 1 refills | Status: DC

## 2020-08-19 MED ORDER — GABAPENTIN 300 MG PO CAPS: 300.0000 mg | ORAL_CAPSULE | Freq: Three times a day (TID) | ORAL | 3 refills | Status: DC

## 2020-08-19 MED ORDER — ISOSORBIDE MONONITRATE ER 60 MG PO TB24: ORAL_TABLET | ORAL | 1 refills | Status: DC

## 2020-08-19 MED ORDER — ROSUVASTATIN CALCIUM 40 MG PO TABS: 40.0000 mg | ORAL_TABLET | Freq: Every day | ORAL | 1 refills | Status: DC

## 2020-08-19 MED ORDER — CIPROFLOXACIN HCL 500 MG PO TABS: 500.0000 mg | ORAL_TABLET | Freq: Two times a day (BID) | ORAL | 0 refills | Status: DC

## 2020-08-19 MED ORDER — APIXABAN 2.5 MG PO TABS: 2.5000 mg | ORAL_TABLET | Freq: Two times a day (BID) | ORAL | 1 refills | Status: DC

## 2020-08-19 NOTE — Assessment & Plan Note (Signed)
Rechecking labs today. Await results. Treat as needed.  °

## 2020-08-19 NOTE — Assessment & Plan Note (Signed)
Under good control on current regimen. Continue current regimen. Continue to monitor. Call with any concerns. Refills given. Labs drawn today.   

## 2020-08-19 NOTE — Assessment & Plan Note (Signed)
Encouraged diet and exercise with goal of losing 1-2 lbs per week. Continue to monitor.

## 2020-08-19 NOTE — Assessment & Plan Note (Signed)
Continue to follow with cardiology. Will keep BP and cholesterol under good control. Call with any concerns.

## 2020-08-19 NOTE — Progress Notes (Signed)
BP 105/70   Pulse 77   Temp 98.2 F (36.8 C)   Ht 5\' 6"  (1.676 m)   Wt 213 lb (96.6 kg)   SpO2 96%   BMI 34.38 kg/m    Subjective:    Patient ID: Tammy [name], female    DOB: 1945/11/17, 75 y.o.   MRN: [id number]  HPI: Tammy [name] is a 75 y.o. female  Chief Complaint  Patient presents with   Urinary Tract Infection    Patient states her urine is real cloudy, and having back pain. Patient states she is having a little discomfort when going to the bathroom    Hyperlipidemia   Hypertension   Diabetes   URINARY SYMPTOMS Duration: 2 weeks Dysuria: yes Urinary frequency: no Urgency: no Small volume voids: no Symptom severity:  moderate Urinary incontinence: no Foul odor: no Hematuria: no Abdominal pain: no Back pain: yes Suprapubic pain/pressure: yes Flank pain: no Fever:  no Vomiting: yes Relief with cranberry juice: no Relief with pyridium: yes Status: worse Previous urinary tract infection: yes Recurrent urinary tract infection: no History of sexually transmitted disease: no Penile discharge: no Treatments attempted: pyridium and increasing fluids  HYPERTENSION / HYPERLIPIDEMIA Satisfied with current treatment? yes Duration of hypertension: chronic BP monitoring frequency: not checking BP medication side effects: no Duration of hyperlipidemia: chronic Cholesterol medication side effects: no Cholesterol supplements: none Medication compliance: excellent compliance Aspirin: yes Recent stressors: no Recurrent headaches: no Visual changes: no Palpitations: no Dyspnea: no Chest pain: no Lower extremity edema: no Dizzy/lightheaded: no  DIABETES Hypoglycemic episodes:no Polydipsia/polyuria: yes Visual disturbance: no Chest pain: no Paresthesias: no Glucose Monitoring: yes Taking Insulin?: yes Blood Pressure Monitoring: not checking Retinal Examination: Up to Date Foot Exam: Up to Date Diabetic Education: Completed Pneumovax: Up to  Date Influenza: Up to Date Aspirin: yes  HYPOTHYROIDISM Thyroid control status:controlled Satisfied with current treatment? yes Medication side effects: no Medication compliance: excellent compliance Etiology of hypothyroidism:  Recent dose adjustment:no Fatigue: yes Cold intolerance: no Heat intolerance: no Weight gain: no Weight loss: no Constipation: no Diarrhea/loose stools: no Palpitations: no Lower extremity edema: no Anxiety/depressed mood: no    Relevant past medical, surgical, family and social history reviewed and updated as indicated. Interim medical history since our last visit reviewed. Allergies and medications reviewed and updated.  Review of Systems  Constitutional: Negative.   HENT: Negative.    Respiratory: Negative.    Cardiovascular: Negative.   Gastrointestinal: Negative.   Genitourinary:  Positive for frequency, hematuria and urgency. Negative for decreased urine volume, difficulty urinating, dyspareunia, dysuria, enuresis, flank pain, genital sores, menstrual problem, pelvic pain, vaginal bleeding, vaginal discharge and vaginal pain.  Musculoskeletal: Negative.   Psychiatric/Behavioral: Negative.     Per HPI unless specifically indicated above     Objective:    BP 105/70   Pulse 77   Temp 98.2 F (36.8 C)   Ht 5\' 6"  (1.676 m)   Wt 213 lb (96.6 kg)   SpO2 96%   BMI 34.38 kg/m   Wt Readings from Last 3 Encounters:  08/19/20 213 lb (96.6 kg)  07/21/20 213 lb 6.4 oz (96.8 kg)  07/19/20 208 lb (94.3 kg)    Physical Exam Vitals and nursing note reviewed.  Constitutional:      General: She is not in acute distress.    Appearance: Normal appearance. She is not ill-appearing, toxic-appearing or diaphoretic.  HENT:     Head: Normocephalic and atraumatic.     Right Ear:  External ear normal.     Left Ear: External ear normal.     Nose: Nose normal.     Mouth/Throat:     Mouth: Mucous membranes are moist.     Pharynx: Oropharynx is clear.   Eyes:     General: No scleral icterus.       Right eye: No discharge.        Left eye: No discharge.     Extraocular Movements: Extraocular movements intact.     Conjunctiva/sclera: Conjunctivae normal.     Pupils: Pupils are equal, round, and reactive to light.  Cardiovascular:     Rate and Rhythm: Normal rate and regular rhythm.     Pulses: Normal pulses.     Heart sounds: Normal heart sounds. No murmur heard.   No friction rub. No gallop.  Pulmonary:     Effort: Pulmonary effort is normal. No respiratory distress.     Breath sounds: Normal breath sounds. No stridor. No wheezing, rhonchi or rales.  Chest:     Chest wall: No tenderness.  Musculoskeletal:        General: Normal range of motion.     Cervical back: Normal range of motion and neck supple.  Skin:    General: Skin is warm and dry.     Capillary Refill: Capillary refill takes less than 2 seconds.     Coloration: Skin is not jaundiced or pale.     Findings: No bruising, erythema, lesion or rash.  Neurological:     General: No focal deficit present.     Mental Status: She is alert and oriented to person, place, and time. Mental status is at baseline.  Psychiatric:        Mood and Affect: Mood normal.        Behavior: Behavior normal.        Thought Content: Thought content normal.        Judgment: Judgment normal.    Results for orders placed or performed in visit on 08/19/20  Microscopic Examination   BLD  Result Value Ref Range   WBC, UA >30 (A) 0 - 5 /hpf   RBC 3-10 (A) 0 - 2 /hpf   Epithelial Cells (non renal) 0-10 0 - 10 /hpf   Mucus, UA Present (A) Not Estab.   Bacteria, UA Many (A) None seen/Few  Bayer DCA Hb A1c Waived  Result Value Ref Range   HB A1C (BAYER DCA - WAIVED) 7.9 (H) <7.0 %  Microalbumin, Urine Waived  Result Value Ref Range   Microalb, Ur Waived 150 (H) 0 - 19 mg/L   Creatinine, Urine Waived 200 10 - 300 mg/dL   Microalb/Creat Ratio 30-300 (H) <30 mg/g  Urinalysis, Routine w reflex  microscopic  Result Value Ref Range   Specific Gravity, UA 1.025 1.005 - 1.030   pH, UA 5.0 5.0 - 7.5   Color, UA Yellow Yellow   Appearance Ur Cloudy (A) Clear   Leukocytes,UA Trace (A) Negative   Protein,UA 1+ (A) Negative/Trace   Glucose, UA 3+ (A) Negative   Ketones, UA Negative Negative   RBC, UA 1+ (A) Negative   Bilirubin, UA Negative Negative   Urobilinogen, Ur 0.2 0.2 - 1.0 mg/dL   Nitrite, UA Negative Negative   Microscopic Examination See below:       Assessment & Plan:   Problem List Items Addressed This Visit       Cardiovascular and Mediastinum   Coronary atherosclerosis of native coronary artery  Continue to follow with cardiology. Will keep BP and cholesterol under good control. Call with any concerns.        Relevant Medications   rosuvastatin (CRESTOR) 40 MG tablet   isosorbide mononitrate (IMDUR) 60 MG 24 hr tablet   apixaban (ELIQUIS) 2.5 MG TABS tablet   Pulmonary embolism (HCC)    Stable on eliquis. Refill given today. Call with any concerns.        Relevant Medications   rosuvastatin (CRESTOR) 40 MG tablet   isosorbide mononitrate (IMDUR) 60 MG 24 hr tablet   apixaban (ELIQUIS) 2.5 MG TABS tablet     Respiratory   COPD (chronic obstructive pulmonary disease) (HCC)    Under good control on current regimen. Continue current regimen. Continue to monitor. Call with any concerns. Refills given. Labs drawn today.        Relevant Medications   PROAIR HFA 108 (90 Base) MCG/ACT inhaler   Other Relevant Orders   CBC with Differential/Platelet   Comprehensive metabolic panel     Endocrine   Hypothyroid    Rechecking labs today. Await results. Treat as needed.        Relevant Orders   CBC with Differential/Platelet   Comprehensive metabolic panel   Type 2 diabetes mellitus with stage 3 chronic kidney disease (HCC)    Up slightly with A1c of 7.9 up from 7.7- likely due to lung cancer treatment and steroids. Will continue current regimen and  recheck 3 months. Call with any concerns.        Relevant Medications   TRULICITY 4.5 BO/1.7PZ SOPN   rosuvastatin (CRESTOR) 40 MG tablet   empagliflozin (JARDIANCE) 25 MG TABS tablet   Other Relevant Orders   Bayer DCA Hb A1c Waived (Completed)   CBC with Differential/Platelet   Comprehensive metabolic panel     Genitourinary   Benign hypertensive renal disease    Under good control on current regimen. Continue current regimen. Continue to monitor. Call with any concerns. Refills given. Labs drawn today.        Relevant Orders   CBC with Differential/Platelet   Comprehensive metabolic panel   Microalbumin, Urine Waived (Completed)   Chronic kidney disease    Rechecking labs today. Await results. Treat as needed.        Relevant Orders   CBC with Differential/Platelet   Comprehensive metabolic panel   Microalbumin, Urine Waived (Completed)     Other   Hyperlipidemia    Under good control on current regimen. Continue current regimen. Continue to monitor. Call with any concerns. Refills given. Labs drawn today.        Relevant Medications   rosuvastatin (CRESTOR) 40 MG tablet   isosorbide mononitrate (IMDUR) 60 MG 24 hr tablet   apixaban (ELIQUIS) 2.5 MG TABS tablet   Other Relevant Orders   CBC with Differential/Platelet   Comprehensive metabolic panel   Lipid Panel w/o Chol/HDL Ratio   Morbid obesity due to excess calories (HCC)    Encouraged diet and exercise with goal of losing 1-2 lbs per week. Continue to monitor.        Relevant Medications   TRULICITY 4.5 WC/5.8NI SOPN   empagliflozin (JARDIANCE) 25 MG TABS tablet   Anemia    Rechecking labs today. Await results. Treat as needed.        Relevant Orders   CBC with Differential/Platelet   Comprehensive metabolic panel   Lung nodule    Concern for slow growing stage 1 lung cancer. Has been following  with Dr. Baruch Gouty and getting radiation. Continue to monitor.        Other Visit Diagnoses      Acute cystitis without hematuria    -  Primary   Will treat with cipro. Call with any concerns. Continue to monitor.    Dysuria       + leuks- will treat   Relevant Orders   Urinalysis, Routine w reflex microscopic   Urine Culture        Follow up plan: No follow-ups on file.

## 2020-08-19 NOTE — Assessment & Plan Note (Signed)
Up slightly with A1c of 7.9 up from 7.7- likely due to lung cancer treatment and steroids. Will continue current regimen and recheck 3 months. Call with any concerns.

## 2020-08-19 NOTE — Assessment & Plan Note (Signed)
Stable on eliquis. Refill given today. Call with any concerns.

## 2020-08-19 NOTE — Assessment & Plan Note (Signed)
Concern for slow growing stage 1 lung cancer. Has been following with Dr. Baruch Gouty and getting radiation. Continue to monitor.

## 2020-08-20 LAB — CBC WITH DIFFERENTIAL/PLATELET
Basophils Absolute: 0.1 10*3/uL (ref 0.0–0.2)
Basos: 1 %
EOS (ABSOLUTE): 0.3 10*3/uL (ref 0.0–0.4)
Eos: 3 %
Hematocrit: 41.6 % (ref 34.0–46.6)
Hemoglobin: 13.5 g/dL (ref 11.1–15.9)
Immature Grans (Abs): 0.1 10*3/uL (ref 0.0–0.1)
Immature Granulocytes: 1 %
Lymphocytes Absolute: 2.2 10*3/uL (ref 0.7–3.1)
Lymphs: 22 %
MCH: 27.4 pg (ref 26.6–33.0)
MCHC: 32.5 g/dL (ref 31.5–35.7)
MCV: 85 fL (ref 79–97)
Monocytes Absolute: 1.1 10*3/uL — ABNORMAL HIGH (ref 0.1–0.9)
Monocytes: 11 %
Neutrophils Absolute: 6.1 10*3/uL (ref 1.4–7.0)
Neutrophils: 62 %
Platelets: 314 10*3/uL (ref 150–450)
RBC: 4.92 x10E6/uL (ref 3.77–5.28)
RDW: 14.1 % (ref 11.7–15.4)
WBC: 9.9 10*3/uL (ref 3.4–10.8)

## 2020-08-20 LAB — LIPID PANEL W/O CHOL/HDL RATIO
Cholesterol, Total: 152 mg/dL (ref 100–199)
HDL: 53 mg/dL (ref >39)
LDL Chol Calc (NIH): 71 mg/dL (ref 0–99)
Triglycerides: 165 mg/dL — ABNORMAL HIGH (ref 0–149)
VLDL Cholesterol Cal: 28 mg/dL (ref 5–40)

## 2020-08-20 LAB — COMPREHENSIVE METABOLIC PANEL
ALT: 11 IU/L (ref 0–32)
AST: 17 IU/L (ref 0–40)
Albumin/Globulin Ratio: 1.7 (ref 1.2–2.2)
Albumin: 4.3 g/dL (ref 3.7–4.7)
Alkaline Phosphatase: 113 IU/L (ref 44–121)
BUN/Creatinine Ratio: 16 (ref 12–28)
BUN: 28 mg/dL — ABNORMAL HIGH (ref 8–27)
Bilirubin Total: 0.4 mg/dL (ref 0.0–1.2)
CO2: 22 mmol/L (ref 20–29)
Calcium: 9.5 mg/dL (ref 8.7–10.3)
Chloride: 103 mmol/L (ref 96–106)
Creatinine, Ser: 1.73 mg/dL — ABNORMAL HIGH (ref 0.57–1.00)
Globulin, Total: 2.5 g/dL (ref 1.5–4.5)
Glucose: 158 mg/dL — ABNORMAL HIGH (ref 65–99)
Potassium: 5.2 mmol/L (ref 3.5–5.2)
Sodium: 142 mmol/L (ref 134–144)
Total Protein: 6.8 g/dL (ref 6.0–8.5)
eGFR: 30 mL/min/{1.73_m2} — ABNORMAL LOW (ref >59)

## 2020-08-22 LAB — URINE CULTURE

## 2020-08-30 ENCOUNTER — Other Ambulatory Visit: Payer: Self-pay | Admitting: Physician Assistant

## 2020-08-30 ENCOUNTER — Other Ambulatory Visit: Payer: Self-pay | Admitting: Internal Medicine

## 2020-08-30 DIAGNOSIS — N3281 Overactive bladder: Secondary | ICD-10-CM

## 2020-09-02 ENCOUNTER — Other Ambulatory Visit: Payer: Self-pay | Admitting: Family Medicine

## 2020-09-02 NOTE — Telephone Encounter (Signed)
Requested medication (s) are due for refill today: yes  Requested medication (s) are on the active medication list: yes  Last refill:  03/08/20 #90 1 refill  Future visit scheduled: ys in 2 months   Notes to clinic:  medication not assigned to a protocol     Requested Prescriptions  Pending Prescriptions Disp Refills   trimethoprim (TRIMPEX) 100 MG tablet [Pharmacy Med Name: TRIMETHOPRIM 100 MG TAB] 90 tablet 1    Sig: TAKE ONE TABLET BY MOUTH EVERY DAY      Off-Protocol Failed - 09/02/2020  5:55 PM      Failed - Medication not assigned to a protocol, review manually.      Passed - Valid encounter within last 12 months    Recent Outpatient Visits           2 weeks ago Acute cystitis without hematuria   [place], Megan P, DO   3 months ago Type 2 diabetes mellitus with stage 3 chronic kidney disease, with long-term current use of insulin, unspecified whether stage 3a or 3b CKD ([place])   [place], Megan P, DO   6 months ago Type 2 diabetes mellitus with stage 3 chronic kidney disease, with long-term current use of insulin, unspecified whether stage 3a or 3b CKD ([place])   [place], Megan P, DO   9 months ago Wellness examination   [employer], Megan P, DO   1 year ago Uncontrolled type 2 diabetes mellitus with complication, without long-term current use of insulin ([place])   [place], Megan P, DO       Future Appointments             In 2 months Johnson, Barb Merino, DO [facility]   In 3 months Stoioff, Ronda Fairly, MD [facility]   In 3 months  [facility]              Signed Prescriptions Disp Refills   SYNTHROID 150 MCG tablet 30 tablet 0    Sig: TAKE ONE TABLET ON AN EMPTY STOMACH WITHA GLASS OF WATER AT LEAST 30 TO 21 [place]      Endocrinology:  Hypothyroid Agents Failed - 09/02/2020   5:55 PM      Failed - TSH needs to be rechecked within 3 months after an abnormal result. Refill until TSH is due.      Passed - TSH in normal range and within 360 days    TSH  Date Value Ref Range Status  11/11/2019 0.865 0.450 - 4.500 uIU/mL Final          Passed - Valid encounter within last 12 months    Recent Outpatient Visits           2 weeks ago Acute cystitis without hematuria   [employer], Megan P, DO   3 months ago Type 2 diabetes mellitus with stage 3 chronic kidney disease, with long-term current use of insulin, unspecified whether stage 3a or 3b CKD ([place])   [place], Megan P, DO   6 months ago Type 2 diabetes mellitus with stage 3 chronic kidney disease, with long-term current use of insulin, unspecified whether stage 3a or 3b CKD ([place])   [place], Megan P, DO   9 months ago Wellness examination   [employer], [place], DO   1 year ago  Uncontrolled type 2 diabetes mellitus with complication, without long-term current use of insulin [facility])   [place], [place], DO       Future Appointments             In 2 months Johnson, Barb Merino, DO [facility]   In 3 months Stoioff, Ronda Fairly, MD [facility]   In 3 months  [facility], [place]

## 2020-09-02 NOTE — Telephone Encounter (Signed)
Future visit in 2 months  

## 2020-09-03 NOTE — Telephone Encounter (Signed)
Pt is scheduled 9/13

## 2020-09-16 ENCOUNTER — Telehealth: Payer: Self-pay

## 2020-09-16 NOTE — Telephone Encounter (Signed)
Patient is aware of date/time of covid test prior to PFT.  

## 2020-09-17 ENCOUNTER — Telehealth: Payer: Self-pay | Admitting: Pharmacist

## 2020-09-17 ENCOUNTER — Telehealth: Payer: Self-pay

## 2020-09-17 NOTE — Chronic Care Management (AMB) (Cosign Needed)
Chronic Care Management Pharmacy Assistant   Name: Tammy [name]  MRN: [id number] DOB: [birth year]   Reason for Encounter: Disease State Diabetes Mellitus and COPD    Recent office visits:  08/19/20-Tammy [name] (PCP) Seen for General follow up and UTI.Labs ordered. Start on Cipro for dysuria.  Recent consult visits:  None noted  Hospital visits:  Medication Reconciliation was completed by comparing discharge summary, patient's EMR and Pharmacy list, and upon discussion with patient.  Admitted to the hospital on 04/17/20 due to Sepsis. Discharge date was 04/18/20. Discharged from New Site?Medications Started at [facility] Discharge:?? -started None noted  Medication Changes at Hospital Discharge: -Changed None noted  Medications Discontinued at Hospital Discharge: -Stopped None noted  Medications that remain the same after Hospital Discharge:??  -All other medications will remain the same.    Medications: Outpatient Encounter Medications as of 09/17/2020  Medication Sig   anastrozole (ARIMIDEX) 1 MG tablet TAKE 1 TABLET BY MOUTH DAILY.   apixaban (ELIQUIS) 2.5 MG TABS tablet Take 1 tablet (2.5 mg total) by mouth 2 (two) times daily.   blood glucose meter kit and supplies KIT Dispense based on patient and insurance preference. Use one time daily as directed, E11.9   Calcium Carb-Cholecalciferol (CALCIUM 600 + D) 600-200 MG-UNIT TABS Take 1 tablet by mouth daily.    cetirizine (ZYRTEC) 10 MG tablet TAKE 1 TABLET BY MOUTH DAILY   cholecalciferol (VITAMIN D3) 25 MCG (1000 UT) tablet Take 1,000 Units by mouth daily.   ciprofloxacin (CIPRO) 500 MG tablet Take 1 tablet (500 mg total) by mouth 2 (two) times daily.   cyanocobalamin (,VITAMIN B-12,) 1000 MCG/ML injection Inject 1,000 mcg into the muscle once a week. Then once a month x 4 months   DULoxetine (CYMBALTA) 60 MG capsule Take 2 capsules (120 mg total) by mouth daily.   empagliflozin (JARDIANCE) 25  MG TABS tablet Take 1 tablet (25 mg total) by mouth daily. TAKE ONE TABLET 25MG EVERY DAY BEFORE BREAKFAST   gabapentin (NEURONTIN) 300 MG capsule Take 1 capsule (300 mg total) by mouth 3 (three) times daily.   glipiZIDE (GLUCOTROL) 5 MG tablet TAKE ONE TABLET BY MOUTH EVERY DAY BEFORE BREAKFAST   Insulin Pen Needle (PEN NEEDLES 3/16") 31G X 5 MM MISC 1 each by Does not apply route daily.   isosorbide mononitrate (IMDUR) 60 MG 24 hr tablet TAKE ONE TABLET BY MOUTH AT BEDTIME AND HALF A TABLET EVERY MORNING   metoprolol succinate (TOPROL-XL) 50 MG 24 hr tablet TAKE ONE TABLET EVERY DAY WITH OR IMMEDIATELY FOLLOWING A MEAL   mometasone (ELOCON) 0.1 % lotion Apply topically as directed. Qd up to 5 days a week to aa scalp until clear, then prn flares   montelukast (SINGULAIR) 10 MG tablet Take 1 tablet (10 mg total) by mouth at bedtime.   MYRBETRIQ 50 MG TB24 tablet TAKE 1 TABLET BY MOUTH DAILY   nitroGLYCERIN (NITROSTAT) 0.4 MG SL tablet Place 1 tablet (0.4 mg total) under the tongue every 5 (five) minutes as needed for chest pain.   pantoprazole (PROTONIX) 40 MG tablet Take 1 tablet (40 mg total) by mouth daily.   Polyethylene Glycol 400 (BLINK TEARS) 0.25 % SOLN Place 1 drop into both eyes 3 (three) times daily as needed (dry/irritated eyes.).    PROAIR HFA 108 (90 Base) MCG/ACT inhaler TAKE 2 PUFFS EVERY 4 HOURS AS NEEDED FORWHEEZING OR SHORTNESS OF BREATH   Pumpkin Seed-Soy Germ (AZO BLADDER CONTROL/GO-LESS  PO) Take by mouth.   rosuvastatin (CRESTOR) 40 MG tablet Take 1 tablet (40 mg total) by mouth daily.   senna (SENOKOT) 8.6 MG TABS tablet Take 8.6 mg by mouth daily as needed for mild constipation.   SYNTHROID 150 MCG tablet TAKE ONE TABLET ON AN EMPTY STOMACH WITHA GLASS OF WATER AT LEAST 30 TO 60 MINUTES BEFORE BREAKFAST   trimethoprim (TRIMPEX) 100 MG tablet TAKE ONE TABLET BY MOUTH EVERY DAY   TRULICITY 4.5 XY/7.2WV SOPN SMARTSIG:4.5 Milligram(s) SUB-Q Once a Week   No  facility-administered encounter medications on file as of 09/17/2020.   Current antihyperglycemic regimen:  Trulicity 7.9NR inject once a week Jardiance 13m 1 tablet before breakfast Glipizide 567m1 tablet before breakfast Tresiba Inject 24 units daily  What recent interventions/DTPs have been made to improve glycemic control:  None noted  Have there been any recent hospitalizations or ED visits since last visit with CPP? No    Current COPD regimen:  Trelegy 1 puff qd Albuterol 2 puffs q4 h prn Montelukast sodiume 10 mg qpm Radiation therapy , 3 sessions remain  Any recent hospitalizations or ED visits since last visit with CPP? No     Adherence Review: Is the patient currently on a STATIN medication? Yes Is the patient currently on ACE/ARB medication? No Does the patient have >5 day gap between last estimated fill dates? No    Star Rating Drugs: Rosuvastatin 40 mg Last filled:08/19/20 90 DS Jardiance 25 mg Last fiWCHJSC:38/37/790 DS Trulicity 4.5 mg Last fiZPSUGA:48/47/208 DS Glipizide 5 mg Last filled:08/06/20 90DS  I have tried to contact patient for her DM and COPD assessment. I have lvm for her to call back.  MyCorrie MckusickRMUlysses

## 2020-09-20 ENCOUNTER — Ambulatory Visit
Admission: RE | Admit: 2020-09-20 | Discharge: 2020-09-20 | Disposition: A | Discharge status: Home / Self Care | Payer: Medicare Other | Source: Ambulatory Visit | Attending: Radiation Oncology | Admitting: Radiation Oncology

## 2020-09-20 ENCOUNTER — Encounter: Payer: Self-pay | Admitting: Radiation Oncology

## 2020-09-20 ENCOUNTER — Other Ambulatory Visit: Payer: Self-pay | Admitting: *Deleted

## 2020-09-20 ENCOUNTER — Other Ambulatory Visit: Payer: Self-pay

## 2020-09-20 VITALS — BP 128/88 | HR 83 | Temp 98.2°F | Resp 16 | Wt 213.9 lb

## 2020-09-20 DIAGNOSIS — C3412 Malignant neoplasm of upper lobe, left bronchus or lung: Secondary | ICD-10-CM

## 2020-09-20 DIAGNOSIS — Z170 Estrogen receptor positive status [ER+]: Secondary | ICD-10-CM | POA: Diagnosis not present

## 2020-09-20 DIAGNOSIS — C50312 Malignant neoplasm of lower-inner quadrant of left female breast: Secondary | ICD-10-CM | POA: Insufficient documentation

## 2020-09-20 DIAGNOSIS — R1013 Epigastric pain: Secondary | ICD-10-CM | POA: Diagnosis not present

## 2020-09-20 DIAGNOSIS — Z923 Personal history of irradiation: Secondary | ICD-10-CM | POA: Insufficient documentation

## 2020-09-20 DIAGNOSIS — R911 Solitary pulmonary nodule: Secondary | ICD-10-CM | POA: Insufficient documentation

## 2020-09-20 DIAGNOSIS — R1310 Dysphagia, unspecified: Secondary | ICD-10-CM | POA: Diagnosis not present

## 2020-09-20 NOTE — Progress Notes (Signed)
Radiation Oncology Follow up Note  Name: Tammy [name]   Date:   09/20/2020 MRN:  [id number] DOB: 1945-09-03    This 75 y.o. female presents to the clinic today for 1 month follow-up status post progressive disease in the left upper lobe and patient previously treated for left breast cancer with whole breast radiation stage I.  REFERRING PROVIDER: [name], DO  HPI: Patient is a 75 year old female now at 1 month having completed SBRT to her left upper lobe for probable stage I non-small cell lung cancer.  She is seen today in routine follow-up is doing fairly well although has multiple somatic complaints.  She is complaining of dyspepsia which has been present present since the time of her breast cancer.  She also states she is starting to have some dysphagia from pills.  She states she is feels to be somewhat slightly short of breath than previously.  She specifically denies hemoptysis..  COMPLICATIONS OF TREATMENT: none  FOLLOW UP COMPLIANCE: keeps appointments   PHYSICAL EXAM:  BP 128/88 (BP Location: Left Arm, Patient Position: Sitting)   Pulse 83   Temp 98.2 F (36.8 C) (Tympanic)   Resp 16   Wt 213 lb 14.4 oz (97 kg)   BMI 34.52 kg/m  Well-developed well-nourished patient in NAD. HEENT reveals PERLA, EOMI, discs not visualized.  Oral cavity is clear. No oral mucosal lesions are identified. Neck is clear without evidence of cervical or supraclavicular adenopathy. Lungs are clear to A&P. Cardiac examination is essentially unremarkable with regular rate and rhythm without murmur rub or thrill. Abdomen is benign with no organomegaly or masses noted. Motor sensory and DTR levels are equal and symmetric in the upper and lower extremities. Cranial nerves II through XII are grossly intact. Proprioception is intact. No peripheral adenopathy or edema is identified. No motor or sensory levels are noted. Crude visual fields are within normal range.  RADIOLOGY RESULTS: No current films for  review  PLAN: Present time patient is doing fairly well I have assured her the symptoms she is complaining about possible slight dysphagia dyspepsia and increased shortness of breath with more than likely not have anything to do with her small confined field of radiation therapy to her right upper lobe using SBRT.  I have asked her to continue follow-up care with her family doctor.  I have asked to see her back in 3 months with a repeat CT scan at that time.  Patient knows to call sooner with any concerns.  I would like to take this opportunity to thank you for allowing me to participate in the care of your patient.[name], MD

## 2020-09-21 ENCOUNTER — Other Ambulatory Visit
Admission: RE | Admit: 2020-09-21 | Discharge: 2020-09-21 | Disposition: A | Discharge status: Home / Self Care | Payer: Medicare Other | Source: Ambulatory Visit | Attending: Pulmonary Disease | Admitting: Pulmonary Disease

## 2020-09-21 DIAGNOSIS — Z20822 Contact with and (suspected) exposure to covid-19: Secondary | ICD-10-CM | POA: Diagnosis not present

## 2020-09-21 DIAGNOSIS — Z01812 Encounter for preprocedural laboratory examination: Secondary | ICD-10-CM | POA: Insufficient documentation

## 2020-09-21 LAB — SARS CORONAVIRUS 2 (TAT 6-24 HRS): SARS Coronavirus 2: NEGATIVE

## 2020-09-22 ENCOUNTER — Other Ambulatory Visit: Payer: Self-pay

## 2020-09-22 ENCOUNTER — Ambulatory Visit: Payer: Medicare Other | Attending: Pulmonary Disease

## 2020-09-22 DIAGNOSIS — Z87891 Personal history of nicotine dependence: Secondary | ICD-10-CM | POA: Insufficient documentation

## 2020-09-22 DIAGNOSIS — R0602 Shortness of breath: Secondary | ICD-10-CM | POA: Diagnosis not present

## 2020-09-27 ENCOUNTER — Encounter: Payer: Self-pay | Admitting: Family Medicine

## 2020-09-28 DIAGNOSIS — Z96651 Presence of right artificial knee joint: Secondary | ICD-10-CM | POA: Diagnosis not present

## 2020-09-30 ENCOUNTER — Other Ambulatory Visit: Payer: Self-pay

## 2020-09-30 ENCOUNTER — Ambulatory Visit (INDEPENDENT_AMBULATORY_CARE_PROVIDER_SITE_OTHER): Payer: Medicare Other | Admitting: Family Medicine

## 2020-09-30 ENCOUNTER — Ambulatory Visit
Admission: RE | Admit: 2020-09-30 | Discharge: 2020-09-30 | Disposition: A | Discharge status: Home / Self Care | Payer: Medicare Other | Source: Ambulatory Visit | Attending: Family Medicine | Admitting: Family Medicine

## 2020-09-30 ENCOUNTER — Ambulatory Visit
Admission: RE | Admit: 2020-09-30 | Discharge: 2020-09-30 | Disposition: A | Discharge status: Home / Self Care | Payer: Medicare Other | Attending: Family Medicine | Admitting: Family Medicine

## 2020-09-30 ENCOUNTER — Telehealth: Payer: Self-pay

## 2020-09-30 ENCOUNTER — Encounter: Payer: Self-pay | Admitting: Family Medicine

## 2020-09-30 VITALS — BP 116/71 | HR 81 | Temp 97.8°F | Wt 213.0 lb

## 2020-09-30 DIAGNOSIS — M546 Pain in thoracic spine: Secondary | ICD-10-CM

## 2020-09-30 DIAGNOSIS — M4312 Spondylolisthesis, cervical region: Secondary | ICD-10-CM | POA: Diagnosis not present

## 2020-09-30 DIAGNOSIS — R1031 Right lower quadrant pain: Secondary | ICD-10-CM

## 2020-09-30 DIAGNOSIS — I25110 Atherosclerotic heart disease of native coronary artery with unstable angina pectoris: Secondary | ICD-10-CM | POA: Diagnosis not present

## 2020-09-30 DIAGNOSIS — N3000 Acute cystitis without hematuria: Secondary | ICD-10-CM

## 2020-09-30 DIAGNOSIS — M5134 Other intervertebral disc degeneration, thoracic region: Secondary | ICD-10-CM | POA: Diagnosis not present

## 2020-09-30 DIAGNOSIS — M4602 Spinal enthesopathy, cervical region: Secondary | ICD-10-CM | POA: Diagnosis not present

## 2020-09-30 DIAGNOSIS — M25512 Pain in left shoulder: Secondary | ICD-10-CM | POA: Diagnosis not present

## 2020-09-30 DIAGNOSIS — M5030 Other cervical disc degeneration, unspecified cervical region: Secondary | ICD-10-CM | POA: Diagnosis not present

## 2020-09-30 LAB — MICROSCOPIC EXAMINATION
RBC, Urine: >30 /hpf — ABNORMAL HIGH (ref 0–2)
WBC, UA: >30 /hpf — ABNORMAL HIGH (ref 0–5)

## 2020-09-30 LAB — URINALYSIS, ROUTINE W REFLEX MICROSCOPIC
Bilirubin, UA: NEGATIVE
Ketones, UA: NEGATIVE
Nitrite, UA: NEGATIVE
Specific Gravity, UA: 1.025 (ref 1.005–1.030)
Urobilinogen, Ur: 0.2 mg/dL (ref 0.2–1.0)
pH, UA: 5 (ref 5.0–7.5)

## 2020-09-30 MED ORDER — KETOROLAC TROMETHAMINE 60 MG/2ML IM SOLN
60.0000 mg | Freq: Once | INTRAMUSCULAR | Status: AC
  Administered 2020-09-30: 60 mg via INTRAMUSCULAR

## 2020-09-30 MED ORDER — CYCLOBENZAPRINE HCL 10 MG PO TABS: 10.0000 mg | ORAL_TABLET | Freq: Every day | ORAL | 0 refills | Status: DC

## 2020-09-30 MED ORDER — CIPROFLOXACIN HCL 500 MG PO TABS: 500.0000 mg | ORAL_TABLET | Freq: Two times a day (BID) | ORAL | 0 refills | Status: DC

## 2020-09-30 NOTE — Telephone Encounter (Signed)
Error

## 2020-09-30 NOTE — Patient Instructions (Addendum)
Appointment with Dr. [name] is 10/14/20 at 8:50am

## 2020-09-30 NOTE — Progress Notes (Signed)
BP 116/71   Pulse 81   Temp 97.8 F (36.6 C) (Oral)   Wt 213 lb (96.6 kg)   SpO2 97%   BMI 34.38 kg/m    Subjective:    Patient ID: Tammy [name], female    DOB: 03-23-1945, 75 y.o.   MRN: [id number]  HPI: Tammy [name] is a 75 y.o. female  Chief Complaint  Patient presents with   Pain   CHEST PAIN Duration: 4 days, really bad 2 days ago Onset: sudden Quality: sharp Severity: severe Location: upper left Radiation: back and L arm Episode duration: minutes Frequency: with movement Related to exertion: yes Trauma: no Anxiety/recent stressors: no Status: worse Treatments attempted: dilaudid, nitro, gabapentin, tylenol  Current pain status: pain free Shortness of breath: yes Cough: no Nausea: yes Diaphoresis: no Heartburn: no Palpitations: no  SHOULDER PAIN Duration: couple of weeks Involved shoulder: left Mechanism of injury: unknown Location: posterior Onset:sudden Severity: severe  Quality:  sharp and aching Frequency: constant Radiation: yes Aggravating factors: at random  Alleviating factors: nothing  Status: worse Treatments attempted: rest, ice, heat, sling, APAP, ibuprofen, and aleve  Relief with NSAIDs?:  no Weakness: no Numbness: no Decreased grip strength: no Redness: no Swelling: no Bruising: no Fevers: no   Relevant past medical, surgical, family and social history reviewed and updated as indicated. Interim medical history since our last visit reviewed. Allergies and medications reviewed and updated.  Review of Systems  Constitutional: Negative.   Respiratory: Negative.    Cardiovascular:  Positive for chest pain. Negative for palpitations and leg swelling.  Gastrointestinal: Negative.   Musculoskeletal:  Positive for arthralgias, back pain and myalgias. Negative for gait problem, joint swelling, neck pain and neck stiffness.  Skin: Negative.   Neurological: Negative.   Psychiatric/Behavioral: Negative.     Per HPI unless  specifically indicated above     Objective:    BP 116/71   Pulse 81   Temp 97.8 F (36.6 C) (Oral)   Wt 213 lb (96.6 kg)   SpO2 97%   BMI 34.38 kg/m   Wt Readings from Last 3 Encounters:  09/30/20 213 lb (96.6 kg)  09/20/20 213 lb 14.4 oz (97 kg)  08/19/20 213 lb (96.6 kg)    Physical Exam Vitals and nursing note reviewed.  Constitutional:      General: She is not in acute distress.    Appearance: Normal appearance. She is not ill-appearing, toxic-appearing or diaphoretic.  HENT:     Head: Normocephalic and atraumatic.     Right Ear: External ear normal.     Left Ear: External ear normal.     Nose: Nose normal.     Mouth/Throat:     Mouth: Mucous membranes are moist.     Pharynx: Oropharynx is clear.  Eyes:     General: No scleral icterus.       Right eye: No discharge.        Left eye: No discharge.     Extraocular Movements: Extraocular movements intact.     Conjunctiva/sclera: Conjunctivae normal.     Pupils: Pupils are equal, round, and reactive to light.  Cardiovascular:     Rate and Rhythm: Normal rate and regular rhythm.     Pulses: Normal pulses.     Heart sounds: Normal heart sounds. No murmur heard.   No friction rub. No gallop.  Pulmonary:     Effort: Pulmonary effort is normal. No respiratory distress.     Breath sounds: Normal breath  sounds. No stridor. No wheezing, rhonchi or rales.  Chest:     Chest wall: No tenderness.  Musculoskeletal:        General: Tenderness (very tender to light touch of the shoulder generalized) present. Normal range of motion.     Cervical back: Normal range of motion and neck supple.  Skin:    General: Skin is warm and dry.     Capillary Refill: Capillary refill takes less than 2 seconds.     Coloration: Skin is not jaundiced or pale.     Findings: No bruising, erythema, lesion or rash.  Neurological:     General: No focal deficit present.     Mental Status: She is alert and oriented to person, place, and time. Mental  status is at baseline.  Psychiatric:        Mood and Affect: Mood normal.        Behavior: Behavior normal.        Thought Content: Thought content normal.        Judgment: Judgment normal.    Results for orders placed or performed in visit on 09/30/20  Urine Culture   Specimen: Urine   UR  Result Value Ref Range   Urine Culture, Routine Final report    Organism ID, Bacteria Comment   Microscopic Examination   Urine  Result Value Ref Range   WBC, UA >30 (H) 0 - 5 /hpf   RBC >30 (H) 0 - 2 /hpf   Epithelial Cells (non renal) 0-10 0 - 10 /hpf   Mucus, UA Present (A) Not Estab.   Bacteria, UA Many (A) None seen/Few  Urinalysis, Routine w reflex microscopic  Result Value Ref Range   Specific Gravity, UA 1.025 1.005 - 1.030   pH, UA 5.0 5.0 - 7.5   Color, UA Yellow Yellow   Appearance Ur Cloudy (A) Clear   Leukocytes,UA Trace (A) Negative   Protein,UA 1+ (A) Negative/Trace   Glucose, UA 3+ (A) Negative   Ketones, UA Negative Negative   RBC, UA 2+ (A) Negative   Bilirubin, UA Negative Negative   Urobilinogen, Ur 0.2 0.2 - 1.0 mg/dL   Nitrite, UA Negative Negative   Microscopic Examination See below:   Sed Rate (ESR)  Result Value Ref Range   Sed Rate 6 0 - 40 mm/hr  CBC with Differential/Platelet  Result Value Ref Range   WBC 9.5 3.4 - 10.8 x10E3/uL   RBC 4.81 3.77 - 5.28 x10E6/uL   Hemoglobin 12.9 11.1 - 15.9 g/dL   Hematocrit 41.1 34.0 - 46.6 %   MCV 85 79 - 97 fL   MCH 26.8 26.6 - 33.0 pg   MCHC 31.4 (L) 31.5 - 35.7 g/dL   RDW 14.1 11.7 - 15.4 %   Platelets 257 150 - 450 x10E3/uL   Neutrophils 59 Not Estab. %   Lymphs 27 Not Estab. %   Monocytes 10 Not Estab. %   Eos 2 Not Estab. %   Basos 1 Not Estab. %   Neutrophils Absolute 5.7 1.4 - 7.0 x10E3/uL   Lymphocytes Absolute 2.5 0.7 - 3.1 x10E3/uL   Monocytes Absolute 1.0 (H) 0.1 - 0.9 x10E3/uL   EOS (ABSOLUTE) 0.2 0.0 - 0.4 x10E3/uL   Basophils Absolute 0.1 0.0 - 0.2 x10E3/uL   Immature Granulocytes 1 Not  Estab. %   Immature Grans (Abs) 0.1 0.0 - 0.1 x10E3/uL  Comprehensive metabolic panel  Result Value Ref Range   Glucose 158 (H) 65 - 99 mg/dL  BUN 24 8 - 27 mg/dL   Creatinine, Ser 1.54 (H) 0.57 - 1.00 mg/dL   eGFR 35 (L) >59 mL/min/1.73   BUN/Creatinine Ratio 16 12 - 28   Sodium 145 (H) 134 - 144 mmol/L   Potassium 5.6 (H) 3.5 - 5.2 mmol/L   Chloride 105 96 - 106 mmol/L   CO2 20 20 - 29 mmol/L   Calcium 9.0 8.7 - 10.3 mg/dL   Total Protein 6.7 6.0 - 8.5 g/dL   Albumin 4.3 3.7 - 4.7 g/dL   Globulin, Total 2.4 1.5 - 4.5 g/dL   Albumin/Globulin Ratio 1.8 1.2 - 2.2   Bilirubin Total 0.3 0.0 - 1.2 mg/dL   Alkaline Phosphatase 104 44 - 121 IU/L   AST 13 0 - 40 IU/L   ALT 10 0 - 32 IU/L      Assessment & Plan:   Problem List Items Addressed This Visit   None Visit Diagnoses     Acute left-sided thoracic back pain    -  Primary   Has not seen her cardiologist in >1 year- will get ber in. Will obtain x-rays to look for issues. Toradol shot today. Will check ESR given severity to r/o PMR   Relevant Medications   cyclobenzaprine (FLEXERIL) 10 MG tablet   ketorolac (TORADOL) injection 60 mg (Completed)   Other Relevant Orders   EKG 12-Lead (Completed)   DG Thoracic Spine W/Swimmers (Completed)   DG Shoulder Left (Completed)   DG Cervical Spine Complete (Completed)   Sed Rate (ESR) (Completed)   RLQ abdominal pain       UA + for UTI- will treat. Call with any concerns.    Relevant Orders   Urinalysis, Routine w reflex microscopic (Completed)   CBC with Differential/Platelet (Completed)   Comprehensive metabolic panel (Completed)   Acute cystitis without hematuria       Will treat. Call with any concerns.    Relevant Orders   Urine Culture (Completed)        Follow up plan: Return in about 2 weeks (around 10/14/2020).

## 2020-10-01 ENCOUNTER — Other Ambulatory Visit: Payer: Self-pay | Admitting: Family Medicine

## 2020-10-01 LAB — COMPREHENSIVE METABOLIC PANEL
ALT: 10 IU/L (ref 0–32)
AST: 13 IU/L (ref 0–40)
Albumin/Globulin Ratio: 1.8 (ref 1.2–2.2)
Albumin: 4.3 g/dL (ref 3.7–4.7)
Alkaline Phosphatase: 104 IU/L (ref 44–121)
BUN/Creatinine Ratio: 16 (ref 12–28)
BUN: 24 mg/dL (ref 8–27)
Bilirubin Total: 0.3 mg/dL (ref 0.0–1.2)
CO2: 20 mmol/L (ref 20–29)
Calcium: 9 mg/dL (ref 8.7–10.3)
Chloride: 105 mmol/L (ref 96–106)
Creatinine, Ser: 1.54 mg/dL — ABNORMAL HIGH (ref 0.57–1.00)
Globulin, Total: 2.4 g/dL (ref 1.5–4.5)
Glucose: 158 mg/dL — ABNORMAL HIGH (ref 65–99)
Potassium: 5.6 mmol/L — ABNORMAL HIGH (ref 3.5–5.2)
Sodium: 145 mmol/L — ABNORMAL HIGH (ref 134–144)
Total Protein: 6.7 g/dL (ref 6.0–8.5)
eGFR: 35 mL/min/{1.73_m2} — ABNORMAL LOW (ref >59)

## 2020-10-01 LAB — CBC WITH DIFFERENTIAL/PLATELET
Basophils Absolute: 0.1 10*3/uL (ref 0.0–0.2)
Basos: 1 %
EOS (ABSOLUTE): 0.2 10*3/uL (ref 0.0–0.4)
Eos: 2 %
Hematocrit: 41.1 % (ref 34.0–46.6)
Hemoglobin: 12.9 g/dL (ref 11.1–15.9)
Immature Grans (Abs): 0.1 10*3/uL (ref 0.0–0.1)
Immature Granulocytes: 1 %
Lymphocytes Absolute: 2.5 10*3/uL (ref 0.7–3.1)
Lymphs: 27 %
MCH: 26.8 pg (ref 26.6–33.0)
MCHC: 31.4 g/dL — ABNORMAL LOW (ref 31.5–35.7)
MCV: 85 fL (ref 79–97)
Monocytes Absolute: 1 10*3/uL — ABNORMAL HIGH (ref 0.1–0.9)
Monocytes: 10 %
Neutrophils Absolute: 5.7 10*3/uL (ref 1.4–7.0)
Neutrophils: 59 %
Platelets: 257 10*3/uL (ref 150–450)
RBC: 4.81 x10E6/uL (ref 3.77–5.28)
RDW: 14.1 % (ref 11.7–15.4)
WBC: 9.5 10*3/uL (ref 3.4–10.8)

## 2020-10-01 LAB — SEDIMENTATION RATE: Sed Rate: 6 mm/hr (ref 0–40)

## 2020-10-02 LAB — URINE CULTURE

## 2020-10-04 ENCOUNTER — Other Ambulatory Visit: Payer: Self-pay | Admitting: Family Medicine

## 2020-10-04 DIAGNOSIS — M25512 Pain in left shoulder: Secondary | ICD-10-CM

## 2020-10-04 DIAGNOSIS — M546 Pain in thoracic spine: Secondary | ICD-10-CM

## 2020-10-04 NOTE — Progress Notes (Signed)
Interpreted by me on 7/21/22NSR at 82bpm, occasional PAC

## 2020-10-05 ENCOUNTER — Other Ambulatory Visit: Payer: Self-pay

## 2020-10-05 ENCOUNTER — Ambulatory Visit (INDEPENDENT_AMBULATORY_CARE_PROVIDER_SITE_OTHER): Payer: Medicare Other | Admitting: Vascular Surgery

## 2020-10-05 ENCOUNTER — Ambulatory Visit (INDEPENDENT_AMBULATORY_CARE_PROVIDER_SITE_OTHER): Payer: Medicare Other

## 2020-10-05 VITALS — BP 132/73 | HR 87 | Ht 66.0 in | Wt 216.0 lb

## 2020-10-05 DIAGNOSIS — I739 Peripheral vascular disease, unspecified: Secondary | ICD-10-CM | POA: Diagnosis not present

## 2020-10-05 DIAGNOSIS — I25110 Atherosclerotic heart disease of native coronary artery with unstable angina pectoris: Secondary | ICD-10-CM | POA: Diagnosis not present

## 2020-10-05 DIAGNOSIS — N183 Chronic kidney disease, stage 3 unspecified: Secondary | ICD-10-CM | POA: Diagnosis not present

## 2020-10-05 DIAGNOSIS — Z794 Long term (current) use of insulin: Secondary | ICD-10-CM | POA: Diagnosis not present

## 2020-10-05 DIAGNOSIS — E1122 Type 2 diabetes mellitus with diabetic chronic kidney disease: Secondary | ICD-10-CM | POA: Diagnosis not present

## 2020-10-05 DIAGNOSIS — I10 Essential (primary) hypertension: Secondary | ICD-10-CM | POA: Diagnosis not present

## 2020-10-05 NOTE — Progress Notes (Signed)
MRN : [id number]  Tammy [name] is a 75 y.o. ([birth year]) female who presents with chief complaint of  Chief Complaint  Patient presents with   Follow-up    1 yr ABI  .  History of Present Illness: Patient returns today in follow up of her PAD.  She had a knee replacement since her last visit and has had more problems in that right leg since that time.  She is having cramping in her calf and is concerned that she could have recurrence of her peripheral arterial disease.  She last had intervention on that right leg a decade ago.  She had a left leg intervention the following year.  No wounds or ulceration.  No rest pain. ABIs today are stable at 0.93 on the right and 0.88 on the left with multiphasic waveforms.  Current Outpatient Medications  Medication Sig Dispense Refill   anastrozole (ARIMIDEX) 1 MG tablet TAKE 1 TABLET BY MOUTH DAILY. 30 tablet 4   apixaban (ELIQUIS) 2.5 MG TABS tablet Take 1 tablet (2.5 mg total) by mouth 2 (two) times daily. 180 tablet 1   blood glucose meter kit and supplies KIT Dispense based on patient and insurance preference. Use one time daily as directed, E11.9 1 each 0   Calcium Carb-Cholecalciferol (CALCIUM 600 + D) 600-200 MG-UNIT TABS Take 1 tablet by mouth daily.      cetirizine (ZYRTEC) 10 MG tablet TAKE 1 TABLET BY MOUTH DAILY 30 tablet 0   cholecalciferol (VITAMIN D3) 25 MCG (1000 UT) tablet Take 1,000 Units by mouth daily.     cyanocobalamin (,VITAMIN B-12,) 1000 MCG/ML injection Inject 1,000 mcg into the muscle once a week. Then once a month x 4 months     DULoxetine (CYMBALTA) 60 MG capsule Take 2 capsules (120 mg total) by mouth daily. 180 capsule 1   empagliflozin (JARDIANCE) 25 MG TABS tablet Take 1 tablet (25 mg total) by mouth daily. TAKE ONE TABLET 25MG EVERY DAY BEFORE BREAKFAST 90 tablet 1   gabapentin (NEURONTIN) 300 MG capsule Take 1 capsule (300 mg total) by mouth 3 (three) times daily. 270 capsule 3   glipiZIDE (GLUCOTROL) 5 MG tablet TAKE  ONE TABLET BY MOUTH EVERY DAY BEFORE BREAKFAST 90 tablet 1   insulin degludec (TRESIBA FLEXTOUCH) 100 UNIT/ML FlexTouch Pen Inject 24 Units into the skin daily.     Insulin Pen Needle (PEN NEEDLES 3/16") 31G X 5 MM MISC 1 each by Does not apply route daily. 100 each 12   isosorbide mononitrate (IMDUR) 60 MG 24 hr tablet TAKE ONE TABLET BY MOUTH AT BEDTIME AND HALF A TABLET EVERY MORNING 135 tablet 1   metoprolol succinate (TOPROL-XL) 50 MG 24 hr tablet TAKE ONE TABLET EVERY DAY WITH OR IMMEDIATELY FOLLOWING A MEAL 90 tablet 1   montelukast (SINGULAIR) 10 MG tablet Take 1 tablet (10 mg total) by mouth at bedtime. 90 tablet 1   MYRBETRIQ 50 MG TB24 tablet TAKE 1 TABLET BY MOUTH DAILY 30 tablet 2   nitroGLYCERIN (NITROSTAT) 0.4 MG SL tablet Place 1 tablet (0.4 mg total) under the tongue every 5 (five) minutes as needed for chest pain. 25 tablet 6   pantoprazole (PROTONIX) 40 MG tablet Take 1 tablet (40 mg total) by mouth daily. 90 tablet 1   PROAIR HFA 108 (90 Base) MCG/ACT inhaler TAKE 2 PUFFS EVERY 4 HOURS AS NEEDED FORWHEEZING OR SHORTNESS OF BREATH 8.5 g 3   Pumpkin Seed-Soy Germ (AZO BLADDER CONTROL/GO-LESS PO) Take 1  tablet by mouth in the morning and at bedtime.     rosuvastatin (CRESTOR) 40 MG tablet Take 1 tablet (40 mg total) by mouth daily. 90 tablet 1   senna (SENOKOT) 8.6 MG TABS tablet Take 8.6 mg by mouth daily as needed for mild constipation.     SYNTHROID 150 MCG tablet TAKE ONE TABLET ON AN EMPTY STOMACH WITHA GLASS OF WATER AT LEAST 30 TO 60 MINUTES BEFORE BREAKFAST 30 tablet 0   trimethoprim (TRIMPEX) 100 MG tablet TAKE ONE TABLET BY MOUTH EVERY DAY 90 tablet 1   TRULICITY 4.5 PY/0.9XI SOPN SMARTSIG:4.5 Milligram(s) SUB-Q Once a Week     cyclobenzaprine (FLEXERIL) 10 MG tablet Take 1 tablet (10 mg total) by mouth at bedtime. (Patient not taking: Reported on 10/05/2020) 30 tablet 0   mometasone (ELOCON) 0.1 % lotion Apply topically as directed. Qd up to 5 days a week to aa scalp until  clear, then prn flares (Patient not taking: Reported on 10/05/2020) 60 mL 1   Polyethylene Glycol 400 (BLINK TEARS) 0.25 % SOLN Place 1 drop into both eyes 3 (three) times daily as needed (dry/irritated eyes.).  (Patient not taking: Reported on 10/05/2020)     No current facility-administered medications for this visit.    Past Medical History:  Diagnosis Date   Asthma    Benign neoplasm of colon    Breast cancer ([place]) 08/2018   left breast   Cervical disc disease    "bulging" - no limitations per pt   Chronic diastolic CHF (congestive heart failure) ([place])    a. 07/2012 Echo: Nl EF. mild inferoseptal HK, Gr 2 DD, mild conc LVH, mild PR/TR, mild PAH.   CKD (chronic kidney disease), stage III (HCC)    COPD (chronic obstructive pulmonary disease) (HCC)    Coronary artery disease    a. 11/2011 Cath: LAD 33m/d, LCX min irregs, RCA 70p, 113m with L->R collats, EF 60%-->Med Rx.   Diabetes mellitus without complication (HCC)    Fusion of lumbar spine 12/22/2015   GERD (gastroesophageal reflux disease)    History of DVT (deep vein thrombosis)    History of tobacco abuse    a. Quit 2011.   Hyperlipidemia    Hypertension    Hypothyroidism    Obesity    Palpitations    PE (pulmonary embolism) 10/15   Personal history of radiation therapy    PONV (postoperative nausea and vomiting)    Pulmonary embolus ([place]) 12/25/2013   RLL. Presented with SOB and elevated d-dimer.    PVD (peripheral vascular disease) ([place])    a. PTA of right leg and left femoral artery with stenosis.   Renal insufficiency    Stroke ([place]) 2015   TIAs - no deficits    Past Surgical History:  Procedure Laterality Date   ANGIOPLASTY / STENTING FEMORAL     PVD; angioplasty right leg and left femoral artery with stenosis.    APPENDECTOMY  Oct. 2016   BACK SURGERY  03/2012   nerve stimulator inserted   BACK SURGERY     screws, rods replaced with new hardware.   BACK SURGERY  11/2016   BREAST LUMPECTOMY Left 08/26/2018    BREAST SURGERY     CARDIAC CATHETERIZATION  12/07/2011   Mid LAD 50%, distal LAD 50%, mid RCA 70%, distal RCA 100%    CARDIAC CATHETERIZATION  01/04/2011   100% occluded mid RCA with good collaterals from distal LAD, normal LVEF.   CHOLECYSTECTOMY     COLONOSCOPY  2013   COLONOSCOPY WITH PROPOFOL N/A 05/24/2015   Procedure: COLONOSCOPY WITH PROPOFOL;  Surgeon: [name], MD;  Location: [facility];  Service: Endoscopy;  Laterality: N/A;  Diabetic - oral meds   COLONOSCOPY WITH PROPOFOL N/A 01/28/2019   Procedure: COLONOSCOPY WITH PROPOFOL;  Surgeon: [name], MD;  Location: [facility];  Service: Endoscopy;  Laterality: N/A;   DIAGNOSTIC LAPAROSCOPY     ESOPHAGOGASTRODUODENOSCOPY (EGD) WITH PROPOFOL N/A 01/28/2019   Procedure: ESOPHAGOGASTRODUODENOSCOPY (EGD) WITH PROPOFOL;  Surgeon: [name], MD;  Location: [facility];  Service: Endoscopy;  Laterality: N/A;   LAPAROSCOPIC APPENDECTOMY N/A 01/06/2015   Procedure: APPENDECTOMY LAPAROSCOPIC;  Surgeon: [name], MD;  Location: [facility];  Service: General;  Laterality: N/A;   LEFT HEART CATH AND CORONARY ANGIOGRAPHY Left 04/04/2019   Procedure: LEFT HEART CATH AND CORONARY ANGIOGRAPHY;  Surgeon: [name], MD;  Location: [facility];  Service: Cardiovascular;  Laterality: Left;   MASTECTOMY W/ SENTINEL NODE BIOPSY Left 08/26/2018   Procedure: PARTIAL MASTECTOMY WIDE EXCISION WITH SENTINEL LYMPH NODE BIOPSY LEFT;  Surgeon: [name], MD;  Location: [facility];  Service: General;  Laterality: Left;   POLYPECTOMY  05/24/2015   Procedure: POLYPECTOMY;  Surgeon: [name], MD;  Location: [facility];  Service: Endoscopy;;   THYROIDECTOMY     TOTAL KNEE ARTHROPLASTY Right 11/13/2019   TRIGGER FINGER RELEASE       Social History   Tobacco Use   Smoking status: Former    Packs/day: 0.50    Years: 15.00    Pack years: 7.50    Types: Cigarettes    Quit date:  03/14/2007    Years since quitting: 13.5   Smokeless tobacco: Never  Vaping Use   Vaping Use: Never used  Substance Use Topics   Alcohol use: No    Alcohol/week: 0.0 standard drinks   Drug use: Never      Family History  Problem Relation Age of Onset   Hypertension Father    Heart disease Father    Heart attack Father    Diabetes Father    Cancer Mother        colon   Heart attack Brother    Diabetes Brother    Hypertension Brother    Heart disease Brother    Heart attack Brother    Diabetes Brother    Hypertension Brother    Heart disease Brother    Heart attack Brother    Diabetes Brother    Hypertension Brother    Heart disease Brother    Cancer Sister        lung   COPD Sister    COPD Sister    Breast cancer Neg Hx      Allergies  Allergen Reactions   Hydrocodone Other (See Comments)    Unresponsive   Aleve [Naproxen Sodium]     Hives & itching    Contrast Media [Iodinated Diagnostic Agents]     Pt avoids due to kidney issues   Ioxaglate Other (See Comments)    (iodine) Pt avoids due to kidney issues    Oxycodone Other (See Comments)    hallucinations   Ranexa [Ranolazine]     Sick on stomach   Sulfa Antibiotics Other (See Comments)    Pt avoids due to kidney issues   Tramadol Other (See Comments)    nightmares    REVIEW OF SYSTEMS (Negative unless checked)   Constitutional: _0 Weight loss  _1 Fever  _2   Chills Cardiac: _0 Chest pain   _1 Chest pressure   _2 Palpitations   _3 Shortness of breath when laying flat   _4 Shortness of breath at rest   _5 Shortness of breath with exertion. Vascular:  _6 Pain in legs with walking   _7 Pain in legs at rest   _8 Pain in legs when laying flat   _9 Claudication   _10 Pain in feet when walking  _11 Pain in feet at rest  _12 Pain in feet when laying flat   _13 History of DVT   _14 Phlebitis   _15 Swelling in legs   _16 Varicose veins   _17 Non-healing ulcers Pulmonary:   _18 Uses home oxygen   _19 Productive cough   _20 Hemoptysis   _21 Wheeze   _22 COPD   _23 Asthma Neurologic:  _24 Dizziness  _25 Blackouts   _26 Seizures   _27 History of stroke   _28 History of TIA  _29 Aphasia   _30 Temporary blindness   _31 Dysphagia   _32 Weakness or numbness in arms   _33 Weakness or numbness in legs Musculoskeletal:  _34 Arthritis   _35 Joint swelling   _36 Joint pain   _37 Low back pain Hematologic:  _38 Easy bruising  _39 Easy bleeding   _40 Hypercoagulable state   _41 Anemic   Gastrointestinal:  _42 Blood in stool   _43 Vomiting blood  _44 Gastroesophageal reflux/heartburn   _45 Abdominal pain Genitourinary:  _46 Chronic kidney disease   _47 Difficult urination  _48 Frequent urination  _49 Burning with urination   _50 Hematuria Skin:  _51 Rashes   _52 Ulcers   _53 Wounds Psychological:  _54 History of anxiety   _55  History of major depression.  Physical Examination  BP 132/73   Pulse 87   Ht _56  (1.676 m)   Wt 216 lb (98 kg)   BMI 34.86 kg/m  Gen:  WD/WN, NAD Head: [place]/AT, No temporalis wasting. Ear/Nose/Throat: Hearing grossly intact, nares w/o erythema or drainage Eyes: Conjunctiva clear. Sclera non-icteric Neck: Supple.  Trachea midline Pulmonary:  Good air movement, no use of accessory muscles.  Cardiac: RRR, no JVD Vascular:  Vessel Right Left  Radial Palpable Palpable                          PT Palpable Palpable  DP Palpable Palpable   Gastrointestinal: soft, non-tender/non-distended. No guarding/reflex.  Musculoskeletal: M/S 5/5 throughout.  No deformity or atrophy. No edema. Neurologic: Sensation grossly intact in extremities.  Symmetrical.  Speech is fluent.  Psychiatric: Judgment intact, Mood & affect appropriate for pt's clinical situation. Dermatologic: No rashes or ulcers noted.  No cellulitis or open wounds.      Labs Recent Results (from the past 2160 hour(s))  CBC with Differential/Platelet     Status: None   Collection Time: 07/19/20 10:19 AM  Result Value Ref Range   WBC 8.9 4.0 - 10.5 K/uL   RBC 4.73 3.87 - 5.11 MIL/uL   Hemoglobin 12.7 12.0 - 15.0  g/dL   HCT 41.0 36.0 - 46.0 %   MCV 86.7 80.0 - 100.0 fL   MCH 26.8 26.0 - 34.0 pg   MCHC 31.0 30.0 - 36.0 g/dL   RDW 15.0 11.5 - 15.5 %   Platelets 285 150 - 400 K/uL   nRBC 0.0 0.0 - 0.2 %   Neutrophils Relative % 60 %   Neutro Abs 5.4 1.7 - 7.7 K/uL   Lymphocytes Relative 23 %   Lymphs Abs 2.1 0.7 - 4.0 K/uL   Monocytes Relative 12 %   Monocytes Absolute 1.0 0.1 - 1.0 K/uL   Eosinophils Relative 3 %   Eosinophils Absolute 0.2 0.0 - 0.5 K/uL   Basophils Relative  1 %   Basophils Absolute 0.1 0.0 - 0.1 K/uL   Immature Granulocytes 1 %   Abs Immature Granulocytes 0.06 0.00 - 0.07 K/uL    Comment: Performed at [facility], [address] [place], [place] [address]  Comprehensive metabolic panel     Status: Abnormal   Collection Time: 07/19/20 10:19 AM  Result Value Ref Range   Sodium 136 135 - 145 mmol/L   Potassium 4.9 3.5 - 5.1 mmol/L   Chloride 104 98 - 111 mmol/L   CO2 23 22 - 32 mmol/L   Glucose, Bld 173 (H) 70 - 99 mg/dL    Comment: Glucose reference range applies only to samples taken after fasting for at least 8 hours.   BUN 34 (H) 8 - 23 mg/dL   Creatinine, Ser 1.73 (H) 0.44 - 1.00 mg/dL   Calcium 9.0 8.9 - 10.3 mg/dL   Total Protein 7.0 6.5 - 8.1 g/dL   Albumin 3.6 3.5 - 5.0 g/dL   AST 14 (L) 15 - 41 U/L   ALT 11 0 - 44 U/L   Alkaline Phosphatase 75 38 - 126 U/L   Total Bilirubin 0.8 0.3 - 1.2 mg/dL   GFR, Estimated 30 (L) >60 mL/min    Comment: (NOTE) Calculated using the CKD-EPI Creatinine Equation (2021)    Anion gap 9 5 - 15    Comment: Performed at [facility], [address] [place], [place] [address]  Urine Culture     Status: None   Collection Time: 08/19/20 10:10 AM   Specimen: Urine   UR  Result Value Ref Range   Urine Culture, Routine Final report    Organism ID, Bacteria Comment     Comment: Mixed urogenital flora 25,000-50,000 colony forming units per mL   Bayer DCA Hb A1c Waived     Status: Abnormal   Collection Time:  08/19/20 10:11 AM  Result Value Ref Range   HB A1C (BAYER DCA - WAIVED) 7.9 (H) <7.0 %    Comment:                                       Diabetic Adult            <7.0                                       Healthy Adult        4.3 - 5.7                                                           (DCCT/NGSP) American Diabetes Association's Summary of Glycemic Recommendations for Adults with Diabetes: Hemoglobin A1c <7.0%. More stringent glycemic goals (A1c <6.0%) may further reduce complications at the cost of increased risk of hypoglycemia.   Microalbumin, Urine Waived     Status: Abnormal   Collection Time: 08/19/20 10:11 AM  Result Value Ref Range   Microalb, Ur Waived 150 (H) 0 - 19 mg/L   Creatinine, Urine Waived 200 10 - 300 mg/dL   Microalb/Creat Ratio 30-300 (H) <30 mg/g    Comment:  Abnormal:       30 - 300                         High Abnormal:           >300   Urinalysis, Routine w reflex microscopic     Status: Abnormal   Collection Time: 08/19/20 10:11 AM  Result Value Ref Range   Specific Gravity, UA 1.025 1.005 - 1.030   pH, UA 5.0 5.0 - 7.5   Color, UA Yellow Yellow   Appearance Ur Cloudy (A) Clear   Leukocytes,UA Trace (A) Negative   Protein,UA 1+ (A) Negative/Trace   Glucose, UA 3+ (A) Negative   Ketones, UA Negative Negative   RBC, UA 1+ (A) Negative   Bilirubin, UA Negative Negative   Urobilinogen, Ur 0.2 0.2 - 1.0 mg/dL   Nitrite, UA Negative Negative   Microscopic Examination See below:   Microscopic Examination     Status: Abnormal   Collection Time: 08/19/20 10:11 AM   BLD  Result Value Ref Range   WBC, UA >30 (A) 0 - 5 /hpf   RBC 3-10 (A) 0 - 2 /hpf   Epithelial Cells (non renal) 0-10 0 - 10 /hpf   Mucus, UA Present (A) Not Estab.   Bacteria, UA Many (A) None seen/Few  CBC with Differential/Platelet     Status: Abnormal   Collection Time: 08/19/20 10:15 AM  Result Value Ref Range   WBC 9.9 3.4 - 10.8 x10E3/uL   RBC  4.92 3.77 - 5.28 x10E6/uL   Hemoglobin 13.5 11.1 - 15.9 g/dL   Hematocrit 41.6 34.0 - 46.6 %   MCV 85 79 - 97 fL   MCH 27.4 26.6 - 33.0 pg   MCHC 32.5 31.5 - 35.7 g/dL   RDW 14.1 11.7 - 15.4 %   Platelets 314 150 - 450 x10E3/uL   Neutrophils 62 Not Estab. %   Lymphs 22 Not Estab. %   Monocytes 11 Not Estab. %   Eos 3 Not Estab. %   Basos 1 Not Estab. %   Neutrophils Absolute 6.1 1.4 - 7.0 x10E3/uL   Lymphocytes Absolute 2.2 0.7 - 3.1 x10E3/uL   Monocytes Absolute 1.1 (H) 0.1 - 0.9 x10E3/uL   EOS (ABSOLUTE) 0.3 0.0 - 0.4 x10E3/uL   Basophils Absolute 0.1 0.0 - 0.2 x10E3/uL   Immature Granulocytes 1 Not Estab. %   Immature Grans (Abs) 0.1 0.0 - 0.1 x10E3/uL  Comprehensive metabolic panel     Status: Abnormal   Collection Time: 08/19/20 10:15 AM  Result Value Ref Range   Glucose 158 (H) 65 - 99 mg/dL   BUN 28 (H) 8 - 27 mg/dL   Creatinine, Ser 1.73 (H) 0.57 - 1.00 mg/dL   eGFR 30 (L) >59 mL/min/1.73   BUN/Creatinine Ratio 16 12 - 28   Sodium 142 134 - 144 mmol/L   Potassium 5.2 3.5 - 5.2 mmol/L   Chloride 103 96 - 106 mmol/L   CO2 22 20 - 29 mmol/L   Calcium 9.5 8.7 - 10.3 mg/dL   Total Protein 6.8 6.0 - 8.5 g/dL   Albumin 4.3 3.7 - 4.7 g/dL   Globulin, Total 2.5 1.5 - 4.5 g/dL   Albumin/Globulin Ratio 1.7 1.2 - 2.2   Bilirubin Total 0.4 0.0 - 1.2 mg/dL   Alkaline Phosphatase 113 44 - 121 IU/L   AST 17 0 - 40 IU/L   ALT 11 0 - 32 IU/L  Lipid Panel w/o Chol/HDL Ratio     Status: Abnormal   Collection Time: 08/19/20 10:15 AM  Result Value Ref Range   Cholesterol, Total 152 100 - 199 mg/dL   Triglycerides 165 (H) 0 - 149 mg/dL   HDL 53 >39 mg/dL   VLDL Cholesterol Cal 28 5 - 40 mg/dL   LDL Chol Calc (NIH) 71 0 - 99 mg/dL  SARS CORONAVIRUS 2 (TAT 6-24 HRS) Nasopharyngeal Nasopharyngeal Swab     Status: None   Collection Time: 09/21/20 12:06 PM   Specimen: Nasopharyngeal Swab  Result Value Ref Range   SARS Coronavirus 2 NEGATIVE NEGATIVE    Comment: (NOTE) SARS-CoV-2  target nucleic acids are NOT DETECTED.  The SARS-CoV-2 RNA is generally detectable in upper and lower respiratory specimens during the acute phase of infection. Negative results do not preclude SARS-CoV-2 infection, do not rule out co-infections with other pathogens, and should not be used as the sole basis for treatment or other patient management decisions. Negative results must be combined with clinical observations, patient history, and epidemiological information. The expected result is Negative.  Fact Sheet for Patients: SugarRoll.be  Fact Sheet for Healthcare Providers: https://www.woods-mathews.com/  This test is not yet approved or cleared by the [place] FDA and  has been authorized for detection and/or diagnosis of SARS-CoV-2 by FDA under an Emergency Use Authorization (EUA). This EUA will remain  in effect (meaning this test can be used) for the duration of the COVID-19 declaration under Se ction 564(b)(1) of the Act, 21 U.S.C. section 360bbb-3(b)(1), unless the authorization is terminated or revoked sooner.  Performed at [facility], [place] [address], [place], [place] [address]   Urinalysis, Routine w reflex microscopic     Status: Abnormal   Collection Time: 09/30/20  2:44 PM  Result Value Ref Range   Specific Gravity, UA 1.025 1.005 - 1.030   pH, UA 5.0 5.0 - 7.5   Color, UA Yellow Yellow   Appearance Ur Cloudy (A) Clear   Leukocytes,UA Trace (A) Negative   Protein,UA 1+ (A) Negative/Trace   Glucose, UA 3+ (A) Negative   Ketones, UA Negative Negative   RBC, UA 2+ (A) Negative   Bilirubin, UA Negative Negative   Urobilinogen, Ur 0.2 0.2 - 1.0 mg/dL   Nitrite, UA Negative Negative   Microscopic Examination See below:   Microscopic Examination     Status: Abnormal   Collection Time: 09/30/20  2:44 PM   Urine  Result Value Ref Range   WBC, UA >30 (H) 0 - 5 /hpf   RBC >30 (H) 0 - 2 /hpf   Epithelial Cells  (non renal) 0-10 0 - 10 /hpf   Mucus, UA Present (A) Not Estab.   Bacteria, UA Many (A) None seen/Few  Sed Rate (ESR)     Status: None   Collection Time: 09/30/20  2:49 PM  Result Value Ref Range   Sed Rate 6 0 - 40 mm/hr  CBC with Differential/Platelet     Status: Abnormal   Collection Time: 09/30/20  2:49 PM  Result Value Ref Range   WBC 9.5 3.4 - 10.8 x10E3/uL   RBC 4.81 3.77 - 5.28 x10E6/uL   Hemoglobin 12.9 11.1 - 15.9 g/dL   Hematocrit 41.1 34.0 - 46.6 %   MCV 85 79 - 97 fL   MCH 26.8 26.6 - 33.0 pg   MCHC 31.4 (L) 31.5 - 35.7 g/dL   RDW 14.1 11.7 - 15.4 %   Platelets 257  150 - 450 x10E3/uL   Neutrophils 59 Not Estab. %   Lymphs 27 Not Estab. %   Monocytes 10 Not Estab. %   Eos 2 Not Estab. %   Basos 1 Not Estab. %   Neutrophils Absolute 5.7 1.4 - 7.0 x10E3/uL   Lymphocytes Absolute 2.5 0.7 - 3.1 x10E3/uL   Monocytes Absolute 1.0 (H) 0.1 - 0.9 x10E3/uL   EOS (ABSOLUTE) 0.2 0.0 - 0.4 x10E3/uL   Basophils Absolute 0.1 0.0 - 0.2 x10E3/uL   Immature Granulocytes 1 Not Estab. %   Immature Grans (Abs) 0.1 0.0 - 0.1 x10E3/uL  Comprehensive metabolic panel     Status: Abnormal   Collection Time: 09/30/20  2:49 PM  Result Value Ref Range   Glucose 158 (H) 65 - 99 mg/dL   BUN 24 8 - 27 mg/dL   Creatinine, Ser 1.54 (H) 0.57 - 1.00 mg/dL   eGFR 35 (L) >59 mL/min/1.73   BUN/Creatinine Ratio 16 12 - 28   Sodium 145 (H) 134 - 144 mmol/L   Potassium 5.6 (H) 3.5 - 5.2 mmol/L   Chloride 105 96 - 106 mmol/L   CO2 20 20 - 29 mmol/L   Calcium 9.0 8.7 - 10.3 mg/dL   Total Protein 6.7 6.0 - 8.5 g/dL   Albumin 4.3 3.7 - 4.7 g/dL   Globulin, Total 2.4 1.5 - 4.5 g/dL   Albumin/Globulin Ratio 1.8 1.2 - 2.2   Bilirubin Total 0.3 0.0 - 1.2 mg/dL   Alkaline Phosphatase 104 44 - 121 IU/L   AST 13 0 - 40 IU/L   ALT 10 0 - 32 IU/L  Urine Culture     Status: None   Collection Time: 09/30/20  4:53 PM   Specimen: Urine   UR  Result Value Ref Range   Urine Culture, Routine Final report     Organism ID, Bacteria Comment     Comment: Mixed urogenital flora 10,000-25,000 colony forming units per mL     Radiology DG Cervical Spine Complete  Result Date: 10/01/2020 CLINICAL DATA:  Acute left-sided thoracic back pain. Left shoulder pain. History of cervical disc disease. EXAM: CERVICAL SPINE - COMPLETE 4+ VIEW COMPARISON:  None. FINDINGS: Trace anterolisthesis of C3 on C4. Slight broad-based rightward curvature. There is multilevel endplate spurring with associated disc space narrowing at C5-C6 and C6-C7. Mild to moderate multilevel facet hypertrophy. No evidence of fracture, focal lesion or bone destruction. Vertebral body heights are normal. No prevertebral soft tissue edema. IMPRESSION: 1. Multilevel degenerative disc disease and facet hypertrophy throughout the cervical spine. 2. Trace anterolisthesis of C3 on C4, likely degenerative. Electronically Signed   By: Keith Rake M.D.   On: 10/01/2020 19:53   DG Thoracic Spine W/Swimmers  Result Date: 10/01/2020 CLINICAL DATA:  Acute left-sided thoracic back pain. Left shoulder pain. EXAM: THORACIC SPINE - 3 VIEWS COMPARISON:  None. FINDINGS: Spinal stimulator is in place with leads at the level of T8-T9. The alignment is maintained. Vertebral body heights are maintained. Mild disc space narrowing and endplate spurring in the mid lower thoracic spine, most prominent at T10-T11. There is no evidence of fracture, focal bone lesion or bone destruction. Posterior elements appear intact. There is no paravertebral soft tissue abnormality. IMPRESSION: 1. Spinal stimulator in place with lead tips at the level of T8-T9. 2. Mild degenerative disc disease in the mid and lower thoracic spine. Electronically Signed   By: Keith Rake M.D.   On: 10/01/2020 19:49   DG Shoulder Left  Result  Date: 10/01/2020 CLINICAL DATA:  Acute left-sided thoracic back pain. Left shoulder pain. Pain below left scapula. EXAM: LEFT SHOULDER - 2+ VIEW COMPARISON:   None. FINDINGS: There is no evidence of fracture or dislocation. Mild acromioclavicular spurring with inferiorly directed osteophytes. Minimal glenohumeral spurring. Glenohumeral joint space is preserved. No erosion, bony destruction, or evidence of focal lesion. Soft tissues are unremarkable. IMPRESSION: Mild acromioclavicular and glenohumeral osteoarthritis. Electronically Signed   By: Keith Rake M.D.   On: 10/01/2020 19:50   Pulmonary Function Test [facility] Only  Result Date: 09/22/2020 Spirometry Data Is Acceptable and Reproducible Moderate Obstructive Airways Disease with Significant Broncho-Dilator Response  +Hyperinflation MILD  Restrictive Lung disease Consider outpatient Pulmonary Consultation if needed Clinical Correlation Advised    Assessment/Plan Hyperlipidemia lipid control important in reducing the progression of atherosclerotic disease. Continue statin therapy     Diabetes mellitus type 2 with complications, uncontrolled blood glucose control important in reducing the progression of atherosclerotic disease. Also, involved in wound healing. On appropriate medications.  PAD (peripheral artery disease) (HCC) ABIs today are stable at 0.93 on the right and 0.88 on the left with multiphasic waveforms.  She is having a little more pain in that right leg after her knee replacement last year, but her perfusion appears to be stable.  No changes from a vascular standpoint at this time.  Recheck in 1 year    Leotis Pain, MD  10/05/2020 3:28 PM    This note was created with Dragon medical transcription system.  Any errors from dictation are purely unintentional

## 2020-10-05 NOTE — Assessment & Plan Note (Signed)
ABIs today are stable at 0.93 on the right and 0.88 on the left with multiphasic waveforms.  She is having a little more pain in that right leg after her knee replacement last year, but her perfusion appears to be stable.  No changes from a vascular standpoint at this time.  Recheck in 1 year

## 2020-10-08 ENCOUNTER — Other Ambulatory Visit: Payer: Self-pay | Admitting: Family Medicine

## 2020-10-12 ENCOUNTER — Telehealth: Payer: Medicare Other | Admitting: General Practice

## 2020-10-12 ENCOUNTER — Ambulatory Visit (INDEPENDENT_AMBULATORY_CARE_PROVIDER_SITE_OTHER): Payer: Medicare Other | Admitting: General Practice

## 2020-10-12 DIAGNOSIS — N183 Chronic kidney disease, stage 3 unspecified: Secondary | ICD-10-CM

## 2020-10-12 DIAGNOSIS — E1122 Type 2 diabetes mellitus with diabetic chronic kidney disease: Secondary | ICD-10-CM

## 2020-10-12 DIAGNOSIS — M546 Pain in thoracic spine: Secondary | ICD-10-CM

## 2020-10-12 DIAGNOSIS — I10 Essential (primary) hypertension: Secondary | ICD-10-CM

## 2020-10-12 DIAGNOSIS — N3000 Acute cystitis without hematuria: Secondary | ICD-10-CM

## 2020-10-12 DIAGNOSIS — Z794 Long term (current) use of insulin: Secondary | ICD-10-CM | POA: Diagnosis not present

## 2020-10-12 DIAGNOSIS — J449 Chronic obstructive pulmonary disease, unspecified: Secondary | ICD-10-CM | POA: Diagnosis not present

## 2020-10-12 DIAGNOSIS — N390 Urinary tract infection, site not specified: Secondary | ICD-10-CM

## 2020-10-12 DIAGNOSIS — R911 Solitary pulmonary nodule: Secondary | ICD-10-CM

## 2020-10-12 DIAGNOSIS — W19XXXA Unspecified fall, initial encounter: Secondary | ICD-10-CM

## 2020-10-12 NOTE — Patient Instructions (Signed)
Visit Information  PATIENT GOALS:  Goals Addressed             This Visit's Progress    RNCM: Monitor and Manage My Blood Sugar       Follow Up at each outreach   - check blood sugar at prescribed times - check blood sugar if I feel it is too high or too low - take the blood sugar log to all doctor visits    Why is this important?   Checking your blood sugar at home helps to keep it from getting very high or very low.  Writing the results in a diary or log helps the doctor know how to care for you.  Your blood sugar log should have the time, date and the results.  Also, write down the amount of insulin or other medicine that you take.  Other information, like what you ate, exercise done and how you were feeling, will also be helpful.   Lab Results  Component Value Date   HGBA1C 7.9 (H) 08/19/2020      Notes: Is compliant with blood sugar checks. Current readings 100-150 Most recent hemoglobin A1C 7.0 on 02-10-2020. 10-12-2020: The patient states blood sugars have been up and down. States highest it has been lately is 171. Has had a UTI and taking antibiotics. Education and support given. Will continue to monitor.      RNCM: Prevent Falls and Injury       Follow Up Date 12-28-2020   - always use handrails on the stairs - always wear low-heeled or flat shoes or slippers with nonskid soles - call the doctor if I am feeling too drowsy - install bathroom grab bars - keep a flashlight by the bed - keep my cell phone with me always - learn how to get back up if I fall - make an emergency alert plan in case I fall - pick up clutter from the floors - use a nonslip pad with throw rugs, or remove them completely - use a cane or walker - use a nightlight in the bathroom - wear my glasses and/or hearing aid    Why is this important?   Most falls happen when it is hard for you to walk safely. Your balance may be off because of an illness. You may have pain in your knees, hip or other  joints.  You may be overly tired or taking medicines that make you sleepy. You may not be able to see or hear clearly.  Falls can lead to broken bones, bruises or other injuries.  There are things you can do to help prevent falling.     Notes: Fall in February with admit to the hospital- UTI. 10-12-2020: The patient denies any new falls. States she is being very careful. Will continue to monitor.      RNCM: Set My Target A1C       Follow Up Date on each outreach   - set target A1C    Why is this important?   Your target A1C is decided together by you and your doctor.  It is based on several things like your age and other health issues.    Notes: the patients current hemoglobin A1C is 7.7 on 11-11-2019 Most recent hemoglobin A1C 7.0 on 02-10-2020 Lab Results  Component Value Date   HGBA1C 7.9 (H) 08/19/2020    10-12-2020: The patient states that her blood sugars have been up and down. Had a kidney infection. Review of  monitoring blood sugars and effective management of blood sugars to maintain A1c        Patient verbalizes understanding of instructions provided today and agrees to view in [place].   Telephone follow up appointment with care management team member scheduled for: 12-28-2020 at [place] am  Noreene Larsson RN, MSN, [facility] Mobile: [phone]

## 2020-10-12 NOTE — Chronic Care Management (AMB) (Signed)
Chronic Care Management   CCM RN Visit Note  10/12/2020 Name: Tammy [name] MRN: [id number] DOB: Sep 07, 1945  Subjective: Tammy [name] is a 75 y.o. year old female who is a primary care patient of [name], DO. The care management team was consulted for assistance with disease management and care coordination needs.    Engaged with patient by telephone for follow up visit in response to provider referral for case management and/or care coordination services.   Consent to Services:  The patient was given information about Chronic Care Management services, agreed to services, and gave verbal consent prior to initiation of services.  Please see initial visit note for detailed documentation.   Patient agreed to services and verbal consent obtained.   Assessment: Review of patient past medical history, allergies, medications, health status, including review of consultants reports, laboratory and other test data, was performed as part of comprehensive evaluation and provision of chronic care management services.   SDOH (Social Determinants of Health) assessments and interventions performed:    CCM Care Plan  Allergies  Allergen Reactions   Hydrocodone Other (See Comments)    Unresponsive   Aleve [Naproxen Sodium]     Hives & itching    Contrast Media [Iodinated Diagnostic Agents]     Pt avoids due to kidney issues   Ioxaglate Other (See Comments)    (iodine) Pt avoids due to kidney issues    Oxycodone Other (See Comments)    hallucinations   Ranexa [Ranolazine]     Sick on stomach   Sulfa Antibiotics Other (See Comments)    Pt avoids due to kidney issues   Tramadol Other (See Comments)    nightmares    Outpatient Encounter Medications as of 10/12/2020  Medication Sig   anastrozole (ARIMIDEX) 1 MG tablet TAKE 1 TABLET BY MOUTH DAILY.   apixaban (ELIQUIS) 2.5 MG TABS tablet Take 1 tablet (2.5 mg total) by mouth 2 (two) times daily.   blood glucose meter kit and supplies KIT  Dispense based on patient and insurance preference. Use one time daily as directed, E11.9   Calcium Carb-Cholecalciferol (CALCIUM 600 + D) 600-200 MG-UNIT TABS Take 1 tablet by mouth daily.    cetirizine (ZYRTEC) 10 MG tablet TAKE 1 TABLET BY MOUTH DAILY   cholecalciferol (VITAMIN D3) 25 MCG (1000 UT) tablet Take 1,000 Units by mouth daily.   cyanocobalamin (,VITAMIN B-12,) 1000 MCG/ML injection Inject 1,000 mcg into the muscle once a week. Then once a month x 4 months   cyclobenzaprine (FLEXERIL) 10 MG tablet Take 1 tablet (10 mg total) by mouth at bedtime. (Patient not taking: Reported on 10/05/2020)   DULoxetine (CYMBALTA) 60 MG capsule Take 2 capsules (120 mg total) by mouth daily.   empagliflozin (JARDIANCE) 25 MG TABS tablet Take 1 tablet (25 mg total) by mouth daily. TAKE ONE TABLET 25MG EVERY DAY BEFORE BREAKFAST   gabapentin (NEURONTIN) 300 MG capsule Take 1 capsule (300 mg total) by mouth 3 (three) times daily.   glipiZIDE (GLUCOTROL) 5 MG tablet TAKE ONE TABLET BY MOUTH EVERY DAY BEFORE BREAKFAST   insulin degludec (TRESIBA FLEXTOUCH) 100 UNIT/ML FlexTouch Pen Inject 24 Units into the skin daily.   Insulin Pen Needle (PEN NEEDLES 3/16") 31G X 5 MM MISC 1 each by Does not apply route daily.   isosorbide mononitrate (IMDUR) 60 MG 24 hr tablet TAKE ONE TABLET BY MOUTH AT BEDTIME AND HALF A TABLET EVERY MORNING   metoprolol succinate (TOPROL-XL) 50 MG 24 hr  tablet TAKE 1 TABLET BY MOUTH DAILY WITH OR IMMEDIATLEY FOLLOWING A MEAL   mometasone (ELOCON) 0.1 % lotion Apply topically as directed. Qd up to 5 days a week to aa scalp until clear, then prn flares (Patient not taking: Reported on 10/05/2020)   montelukast (SINGULAIR) 10 MG tablet Take 1 tablet (10 mg total) by mouth at bedtime.   MYRBETRIQ 50 MG TB24 tablet TAKE 1 TABLET BY MOUTH DAILY   nitroGLYCERIN (NITROSTAT) 0.4 MG SL tablet Place 1 tablet (0.4 mg total) under the tongue every 5 (five) minutes as needed for chest pain.    pantoprazole (PROTONIX) 40 MG tablet Take 1 tablet (40 mg total) by mouth daily.   Polyethylene Glycol 400 (BLINK TEARS) 0.25 % SOLN Place 1 drop into both eyes 3 (three) times daily as needed (dry/irritated eyes.).  (Patient not taking: Reported on 10/05/2020)   PROAIR HFA 108 (90 Base) MCG/ACT inhaler TAKE 2 PUFFS EVERY 4 HOURS AS NEEDED FORWHEEZING OR SHORTNESS OF BREATH   Pumpkin Seed-Soy Germ (AZO BLADDER CONTROL/GO-LESS PO) Take 1 tablet by mouth in the morning and at bedtime.   rosuvastatin (CRESTOR) 40 MG tablet Take 1 tablet (40 mg total) by mouth daily.   senna (SENOKOT) 8.6 MG TABS tablet Take 8.6 mg by mouth daily as needed for mild constipation.   SYNTHROID 150 MCG tablet TAKE ONE TABLET ON AN EMPTY STOMACH WITHA GLASS OF WATER AT LEAST 30 TO 60 MINUTES BEFORE BREAKFAST   trimethoprim (TRIMPEX) 100 MG tablet TAKE ONE TABLET BY MOUTH EVERY DAY   TRULICITY 4.5 RA/0.7MA SOPN SMARTSIG:4.5 Milligram(s) SUB-Q Once a Week   No facility-administered encounter medications on file as of 10/12/2020.    Patient Active Problem List   Diagnosis Date Noted   Lung nodule 08/19/2020   Fall 04/17/2020   Anemia 02/16/2020   Asthma 02/16/2020   History of cardiac catheterization 02/16/2020   History of left mastectomy 02/16/2020   Malignant tumor of breast ([place]) 02/16/2020   History of total knee arthroplasty 11/18/2019   Unstable angina ([place]) 04/03/2019   Dysphagia    Thickening of esophagus    Stomach irritation    Gastric polyp    Positive occult stool blood test    Polyp of colon    Hyperkalemia 11/27/2018   Proteinuria 11/27/2018   Type 2 diabetes mellitus with stage 3 chronic kidney disease ([place]) 11/27/2018   Carcinoma of lower-inner quadrant of left breast in female, estrogen receptor positive ([place]) 10/02/2018   Right knee pain 08/01/2017   Chronic right-sided low back pain with right-sided sciatica 05/28/2017   Gait abnormality 05/28/2017   Hypothyroid 07/18/2016   COPD (chronic  obstructive pulmonary disease) ([place]) 07/18/2016   Encephalomalacia on imaging study 12/10/2015   Calcification of both carotid arteries 12/10/2015   Benign neoplasm of sigmoid colon    Benign neoplasm of cecum    Benign neoplasm of ascending colon    Bilateral atelectasis 04/11/2015   Fatty liver disease, nonalcoholic 26/33/3545   Hydronephrosis of right kidney 04/11/2015   Abdominal aortic ectasia ([place]) 04/11/2015   Constipation 04/07/2015   Abdominal aortic atherosclerosis ([place]) 12/18/2014   Diverticulosis of colon 12/18/2014   Rhinitis, allergic 10/20/2014   Coronary artery disease    Essential hypertension    Morbid obesity due to excess calories ([place])    Spinal stenosis of lumbar region 06/06/2014   Herniated nucleus pulposus 06/06/2014   Neuralgia neuritis, sciatic nerve 05/24/2014   Pulmonary embolism ([place]) 01/30/2014   Benign hypertensive renal  disease 12/16/2013   Uncontrolled type 2 diabetes mellitus with hyperglycemia, with long-term current use of insulin ([place]) 12/16/2013   Chronic kidney disease 12/16/2013   Frequent UTI 11/03/2013   Overactive bladder 11/03/2013   Coronary atherosclerosis of native coronary artery 06/19/2013   History of back surgery 06/19/2013   Hyperlipidemia 06/19/2013   PAD (peripheral artery disease) ([place]) 06/19/2013   Congenital obstruction of ureteropelvic junction 11/21/2011    Conditions to be addressed/monitored:HTN, COPD, DMII, and Chronic pain and falls, current UTI/cystitis   Care Plan : RNCM: Chronic Pain (Adult)  Updates made by [name] since 10/12/2020 12:00 AM     Problem: RNCM: Pain Management Plan (Chronic Pain)   Priority: High     Long-Range Goal: RNCM: Pain Management Plan Developed   Start Date: 05/11/2020  Expected End Date: 10/07/2021  This Visit's Progress: On track  Priority: High  Note:   CARE PLAN ENTRY (see longitudinal plan of care for additional care plan information)  Current Barriers:  Knowledge  Deficits related to how to best control post operative knee replacement pain- right knee Chronic Disease Management support and education needs related to chronic pain and discomfort related to knee pain and post surgical knee replacement surgery and back pain  Nurse Case Manager Clinical Goal(s):  Over the next 120 days, patient will verbalize understanding of plan for pain management for knee pain Over the next 120 days, patient will attend all scheduled medical appointments: next appointment with the pcp on 05-11-2020- the patient sees pcp on a regular basis. Over the next 120 days, patient will demonstrate improved adherence to prescribed treatment plan for pain relief of post surgical right knee pain as evidenced bypain relief and increased activity level Over the next 120 days, patient will demonstrate improved health management independence as evidenced byrecovery from knee replacement surgery   Interventions:  Inter-disciplinary care team collaboration (see longitudinal plan of care) Evaluation of current treatment plan related to pain control and patient's adherence to plan as established by provider. 05-11-2020: The patient states she is still having pain in her right knee with popping at times. Evaluation if she has dicussed with the surgeon and she has not. The patient will talk to the pcp today about this. The patient also has back pain and discomfort. Had a fall recently and went to the hospital to be evaluated. Had a UTI. Has finished course of abx.  10-12-2020: The patient is now experience thoracic pain and will see a specialist on 10-14-2020 for evaluation and recommendation. The patient is using voltaran gel but that does not help much. Recommended cold or heat application. The patient has not tried this yet. The patient states that she may try this. She says when she is sitting down it does not hurt. No correlation to breathing. She is hopeful for answers on Thursday.  Advised patient to call  provider for changes in level or intensity of pain and discomfort. 05-11-2020: Will discuss back pain and knee pain today with the provider. 10-12-2020: Saw pcp recently and follows up with specialist for thoracic pain on 10-14-2020 Provided education to patient re: alternative methods of pain control and to continue to work with PT to manage pain and discomfort. 10-12-2020: Review of alternate methods for pain control. Will continue to monitor.  Reviewed medications with patient and discussed patient states opioids make her sick on her stomach. Does not like to take them but will for uncontrolled pain.  Reviewed scheduled/upcoming provider appointments including: 10-12-2020: No  upcoming appointments to see the pcp but know to call for changes.Sees specialist on 10-14-2020   Patient Self Care Activities:  Patient verbalizes understanding of plan to work with PT and call the provider for changes in pain level Attends all scheduled provider appointments Calls provider office for new concerns or questions - pain assessed= 10-12-2020: Still having a lot of unresolved pain and discomfort. Will see specialist this week. Is hopeful for some answers to her pain and what to do to relieve pain.  - pain treatment goals reviewed - patient response to treatment assessed  Follow up cal with the patient on 12-28-2020 at 0945 am    Task: RNCM: Partner to Develop Chronic Pain Management Plan Completed 10/12/2020  Note:   Care Management Activities:    - pain assessed - pain treatment goals reviewed - patient response to treatment assessed    Notes: The patients pain is controlled at this time    Care Plan : RNCM: Hypertension (Adult)  Updates made by [name] since 10/12/2020 12:00 AM     Problem: RNCM: Hypertension (Hypertension)   Priority: Medium     Long-Range Goal: RNCM: Hypertension Monitored   Start Date: 05/11/2020  Expected End Date: 10/07/2021  This Visit's Progress: On track  Priority: Medium   Note:   Objective:  Last practice recorded BP readings:  BP Readings from Last 3 Encounters:  10/05/20 132/73  09/30/20 116/71  09/20/20 128/88   Most recent eGFR/CrCl:  Lab Results  Component Value Date   EGFR 35 (L) 09/30/2020    No components found for: CRCL Current Barriers:  Knowledge Deficits related to basic understanding of hypertension pathophysiology and self care management Knowledge Deficits related to understanding of medications prescribed for management of hypertension Unable to independently manage HTN  Case Manager Clinical Goal(s):  patient will verbalize understanding of plan for hypertension management patient will demonstrate improved adherence to prescribed treatment plan for hypertension as evidenced by taking all medications as prescribed, monitoring and recording blood pressure as directed, adhering to low sodium/DASH diet patient will demonstrate improved health management independence as evidenced by checking blood pressure as directed and notifying PCP if SBP>160 or DBP > 90, taking all medications as prescribe, and adhering to a low sodium diet as discussed. patient will verbalize basic understanding of hypertension disease process and self health management plan as evidenced by compliance with medications, compliance with heart heatlhy/ADA diet and working with the CCM team to optimize health and well being.  Interventions:  Collaboration with [name], DO regarding development and update of comprehensive plan of care as evidenced by provider attestation and co-signature Inter-disciplinary care team collaboration (see longitudinal plan of care) Evaluation of current treatment plan related to hypertension self management and patient's adherence to plan as established by provider. Provided education to patient re: stroke prevention, s/s of heart attack and stroke, DASH diet, complications of uncontrolled blood pressure Reviewed medications with patient  and discussed importance of compliance Discussed plans with patient for ongoing care management follow up and provided patient with direct contact information for care management team Advised patient, providing education and rationale, to monitor blood pressure daily and record, calling PCP for findings outside established parameters.  Self-Care Activities: - Self administers medications as prescribed Attends all scheduled provider appointments Calls provider office for new concerns, questions, or BP outside discussed parameters Checks BP and records as discussed Follows a low sodium diet/DASH diet Patient Goals: - check blood pressure weekly - choose a  place to take my blood pressure (home, clinic or office, retail store) - write blood pressure results in a log or diary - agree on reward when goals are met - agree to work together to make changes - ask questions to understand - have a family meeting to talk about healthy habits - learn about high blood pressure  Follow Up Plan: Telephone follow up appointment with care management team member scheduled for: 12-28-2020 at 0945 am    Task: RNCM: Identify and Monitor Blood Pressure Elevation Completed 10/12/2020  Outcome: Positive  Note:   Care Management Activities:    - blood pressure trends reviewed - home or ambulatory blood pressure monitoring encouraged        Care Plan : RNCM:Diabetes Type 2 (Adult)  Updates made by [name] since 10/12/2020 12:00 AM     Problem: RNCM: Glycemic Management (Diabetes, Type 2)   Priority: Medium  Note:   CARE PLAN ENTRY (see longtitudinal plan of care for additional care plan information)  Objective:  Lab Results  Component Value Date   HGBA1C 7.7 (H) 11/11/2019   Lab Results  Component Value Date   CREATININE 1.61 (H) 11/27/2019   CREATININE 1.77 (H) 11/11/2019   CREATININE 1.49 (H) 09/02/2019   No results found for: EGFR  Current Barriers:  Knowledge Deficits related to  basic Diabetes pathophysiology and self care/management Knowledge Deficits related to medications used for management of diabetes  Case Manager Clinical Goal(s):  Over the next 120 days, patient will demonstrate improved adherence to prescribed treatment plan for diabetes self care/management as evidenced by:  daily monitoring and recording of CBG  adherence to ADA/ carb modified diet exercise 3/4 days/week adherence to prescribed medication regimen  Interventions:  Provided education to patient about basic DM disease process Reviewed medications with patient and discussed importance of medication adherence Discussed plans with patient for ongoing care management follow up and provided patient with direct contact information for care management team Provided patient with written educational materials related to hypo and hyperglycemia and importance of correct treatment Reviewed scheduled/upcoming provider appointments including: 02-10-2020 at 2:20 pm Advised patient, providing education and rationale, to check cbg BID and record, calling pcp for findings outside established parameters.   Review of patient status, including review of consultants reports, relevant laboratory and other test results, and medications completed.  Patient Self Care Activities:  UNABLE to independently manage DM as evidence of hemoglobin A1C of 7.7 on 11-11-2019  Initial goal documentation     Long-Range Goal: RNCM: Glycemic Management Optimized   Start Date: 05/11/2020  Expected End Date: 08/28/2021  This Visit's Progress: On track  Priority: Medium  Note:   Objective:  Lab Results  Component Value Date   HGBA1C 7.9 (H) 08/19/2020    Lab Results  Component Value Date   CREATININE 1.54 (H) 09/30/2020   CREATININE 1.73 (H) 08/19/2020   CREATININE 1.73 (H) 07/19/2020    No results found for: EGFR Current Barriers:  Knowledge Deficits related to basic Diabetes pathophysiology and self  care/management Knowledge Deficits related to medications used for management of diabetes Unable to independently manage DM as evidence of elevated A1C at times, 10-12-2020: The patient's most recent hemoglobin A1C 7.9 Case Manager Clinical Goal(s):  Over the next 120 days, patient will demonstrate improved adherence to prescribed treatment plan for diabetes self care/management as evidenced by:  daily monitoring and recording of CBG  adherence to ADA/ carb modified diet adherence to prescribed medication regimen Interventions:  Provided education to patient about basic DM disease process Discussed plans with patient for ongoing care management follow up and provided patient with direct contact information for care management team Provided patient with written educational materials related to hypo and hyperglycemia and importance of correct treatment Reviewed scheduled/upcoming provider appointments including: no upcoming appointment with pcp. Knows to call for changes or needs Advised patient, providing education and rationale, to check cbg TID and record, calling pcp for findings outside established parameters.  05-11-2020: Blood sugars elevated since taking prednisone. States they are stabilized.  Denies any blood sugars >200.  States readings recently 118-140. 10-12-2020: The patient states that her blood sugars have been up and down recently. She has a current UTI and believes the medications she is taking is causing her to have elevated blood sugars. States the highest she has had is 171. Education and support given. Will continue to monitor for changes.  Review of patient status, including review of consultants reports, relevant laboratory and other test results, and medications completed. Patient Goals/Self-Care Activities Over the next 120 days, patient will:  - Checks blood sugars as prescribed and utilize hyper and hypoglycemia protocol as needed Adheres to prescribed ADA/carb modified Follow  Up Plan:  Telephone follow up appointment with care management team member scheduled for: 06-15-2020 1:45 pm    Task: RNCM: Alleviate Barriers to Glycemic Management Completed 10/12/2020  Outcome: Positive  Note:   Care Management Activities:    - A1C testing facilitated - barriers to adherence to treatment plan identified - blood glucose monitoring encouraged - blood glucose readings reviewed - mutual A1C goal set or reviewed - self-awareness of signs/symptoms of hypo or hyperglycemia encouraged - use of blood glucose monitoring log promoted    Notes: the patient is currently compliant with DM management     Care Plan : RNCM: COPD (Adult)  Updates made by [name] since 10/12/2020 12:00 AM     Problem: RNCM: Psychological Adjustment to Diagnosis (COPD)   Priority: Medium     Long-Range Goal: RNCM: COPD/Pneumonia/Lung nodule   Start Date: 05/11/2020  Expected End Date: 10/07/2021  This Visit's Progress: On track  Priority: Medium  Note:   Current Barriers:  Knowledge deficits related to basic understanding of COPD/pneumonia/ lung nodule disease process Knowledge deficits related to basic COPD/ lung nodule/pneumonia self care/management Knowledge deficit related to basic understanding of how to use inhalers and how inhaled medications work Knowledge deficit related to importance of energy conservation Unable to independently manage COPD/pneumonia and recent diagnosis of lung nodule Lacks social connections Unable to perform IADLs independently Does not contact provider office for questions/concerns  Case Manager Clinical Goal(s): Over the next 120 days patient will report using inhalers as prescribed including rinsing mouth after use Over the next 120 days, patient will be able to verbalize understanding of COPD action plan and when to seek appropriate levels of medical care Over the next 120 days, patient will engage in lite exercise as tolerated to build/regain stamina and  strength and reduce shortness of breath through activity tolerance Over the next 120 days, patient will verbalize basic understanding of COPD disease process and self care activities Over the next 120 days, patient will not be hospitalized for COPD exacerbation as evidenced  Interventions:  Collaboration with [name], DO regarding development and update of comprehensive plan of care as evidenced by provider attestation and co-signature Inter-disciplinary care team collaboration (see longitudinal plan of care) Provided patient with basic written and verbal COPD/Pneumonia/Lung  nodule education on self care/management/and exacerbation prevention  Provided patient with COPD /Pneumonia/Lung nodule action plan and reinforced importance of daily self assessment. 10-12-2020: The patient states that she is doing about the same with her COPD, has seen the pulmonologist recently and has not had any changes in the plan of care. Will continue to monitor for needs.  Provided written and verbal instructions on pursed lip breathing and utilized returned demonstration as teach back Provided instruction about proper use of medications used for management of COPD including inhalers Advised patient to self assesses COPD/Pneumonia/Lung nodule  action plan zone and make appointment with provider if in the yellow zone for 48 hours without improvement. Provided patient with education about the role of exercise in the management of COPD Advised patient to engage in light exercise as tolerated 3-5 days a week Provided education about and advised patient to utilize infection prevention strategies to reduce risk of respiratory infection  Patient Goals/Self-Care Activities:  - decision-making supported - depression screen reviewed - emotional support provided - family involvement promoted - problem-solving facilitated - relaxation techniques promoted - verbalization of feelings encouraged Follow Up Plan: Telephone  follow up appointment with care management team member scheduled for: 12-28-2020 at 0945    Task: RNCM: Support Psychosocial Response to Chronic Obstructive Pulmonary Disease Completed 10/12/2020  Outcome: Positive  Note:   Care Management Activities:    - decision-making supported - depression screen reviewed - emotional support provided - family involvement promoted - problem-solving facilitated - relaxation techniques promoted - verbalization of feelings encouraged      Care Plan : RNCM; Fall Risk (Adult)  Updates made by [name] since 10/12/2020 12:00 AM     Problem: RNCM: Fall Risk   Priority: High     Long-Range Goal: RNCM: Absence of Fall and Fall-Related Injury   Start Date: 05/11/2020  Expected End Date: 08/28/2021  This Visit's Progress: On track  Priority: High  Note:   Current Barriers:  Knowledge Deficits related to fall precautions in patient with chronic pain, and several other chronic conditions impacting care  Decreased adherence to prescribed treatment for fall prevention Unable to perform IADLs independently Does not contact provider office for questions/concerns Chronic Disease Management support and education needs related to increased falls risk in patient with multiple chronic conditions  Clinical Goal(s):  patient will demonstrate improved adherence to prescribed treatment plan for decreasing falls as evidenced by patient reporting and review of EMR patient will verbalize using fall risk reduction strategies discussed patient will not experience additional falls patient will verbalize understanding of plan for effective management of falls prevention and safety in the home Interventions:  Collaboration with [name], DO regarding development and update of comprehensive plan of care as evidenced by provider attestation and co-signature Inter-disciplinary care team collaboration (see longitudinal plan of care) Provided written and verbal  education re: Potential causes of falls and Fall prevention strategies Reviewed medications and discussed potential side effects of medications such as dizziness and frequent urination Assessed for s/s of orthostatic hypotension Assessed for falls since last encounter. 10-12-2020: The patient denies any new falls at this time. The patient states that she is being very careful and monitoring her activity. Education and support given. Will continue to monitor.  Assessed patients knowledge of fall risk prevention secondary to previously provided education. Assessed working status of life alert bracelet and patient adherence Provided patient information for fall alert systems Evaluation of current treatment plan related to falls prevention and safety  and patient's adherence to plan as established by provider. 10-12-2020: The patient has a lot of health issues going on right now and is seeing several specialist. She is remaining optimistic and being safe in her home environment. Will continue to monitor.  Advised patient to call the office for new falls/changes in chronic conditions or questions Provided education to patient re: falls prevention and safety and monitoring for changes in conditions that may put patient at increase risk of falls Provided patient with falls prevention  educational materials related to preventing falls and being safe in home environment  Discussed plans with patient for ongoing care management follow up and provided patient with direct contact information for care management team Self-Care Deficits:  Lacks social connections Unable to perform IADLs independently Does not contact provider office for questions/concerns Patient Goals:  - Utilize walker and wheelchair (assistive device) appropriately with all ambulation - De-clutter walkways - Change positions slowly - Wear secure fitting shoes at all times with ambulation - Utilize home lighting for dim lit areas - Demonstrate  self and pet awareness at all times - activities of daily living skills assessed - assistive or adaptive device use encouraged - barriers to physical activity or exercise addressed - barriers to physical activity or exercise identified - barriers to safety identified - cognition assessed - cognitive-stimulating activities promoted - fall prevention plan reviewed and updated - fear of falling, loss of independence and pain acknowledged Follow Up Plan: Telephone follow up appointment with care management team member scheduled for: 12-28-2020 at 0945 am    Task: RNCM: Identify and Manage Contributors to Fall Risk Completed 10/12/2020  Outcome: Positive  Note:   Care Management Activities:    - activities of daily living skills assessed - assistive or adaptive device use encouraged - barriers to physical activity or exercise addressed - barriers to physical activity or exercise identified - barriers to safety identified - cognition assessed - cognitive-stimulating activities promoted - fall prevention plan reviewed and updated - fear of falling, loss of independence and pain acknowledged         Plan:Telephone follow up appointment with care management team member scheduled for:  12-28-2020 at [place] am  Noreene Larsson RN, MSN, [facility] Mobile: [phone]

## 2020-10-14 ENCOUNTER — Ambulatory Visit: Payer: Medicare Other | Admitting: Nurse Practitioner

## 2020-10-14 DIAGNOSIS — M461 Sacroiliitis, not elsewhere classified: Secondary | ICD-10-CM | POA: Insufficient documentation

## 2020-10-14 DIAGNOSIS — M5412 Radiculopathy, cervical region: Secondary | ICD-10-CM | POA: Insufficient documentation

## 2020-10-18 ENCOUNTER — Inpatient Hospital Stay: Payer: Medicare Other | Attending: Internal Medicine

## 2020-10-18 ENCOUNTER — Other Ambulatory Visit: Payer: Self-pay

## 2020-10-18 ENCOUNTER — Inpatient Hospital Stay (HOSPITAL_BASED_OUTPATIENT_CLINIC_OR_DEPARTMENT_OTHER): Payer: Medicare Other | Admitting: Internal Medicine

## 2020-10-18 VITALS — BP 121/64 | HR 73 | Temp 99.3°F | Resp 18 | Ht 66.0 in | Wt 215.0 lb

## 2020-10-18 DIAGNOSIS — Z170 Estrogen receptor positive status [ER+]: Secondary | ICD-10-CM | POA: Insufficient documentation

## 2020-10-18 DIAGNOSIS — Z87891 Personal history of nicotine dependence: Secondary | ICD-10-CM | POA: Insufficient documentation

## 2020-10-18 DIAGNOSIS — C50312 Malignant neoplasm of lower-inner quadrant of left female breast: Secondary | ICD-10-CM | POA: Diagnosis not present

## 2020-10-18 DIAGNOSIS — Z79811 Long term (current) use of aromatase inhibitors: Secondary | ICD-10-CM | POA: Diagnosis not present

## 2020-10-18 DIAGNOSIS — N183 Chronic kidney disease, stage 3 unspecified: Secondary | ICD-10-CM | POA: Insufficient documentation

## 2020-10-18 DIAGNOSIS — Z79899 Other long term (current) drug therapy: Secondary | ICD-10-CM | POA: Diagnosis not present

## 2020-10-18 LAB — CBC WITH DIFFERENTIAL/PLATELET
Abs Immature Granulocytes: 0.06 10*3/uL (ref 0.00–0.07)
Basophils Absolute: 0.1 10*3/uL (ref 0.0–0.1)
Basophils Relative: 1 %
Eosinophils Absolute: 0.2 10*3/uL (ref 0.0–0.5)
Eosinophils Relative: 2 %
HCT: 41.4 % (ref 36.0–46.0)
Hemoglobin: 12.8 g/dL (ref 12.0–15.0)
Immature Granulocytes: 1 %
Lymphocytes Relative: 27 %
Lymphs Abs: 2.1 10*3/uL (ref 0.7–4.0)
MCH: 27.4 pg (ref 26.0–34.0)
MCHC: 30.9 g/dL (ref 30.0–36.0)
MCV: 88.5 fL (ref 80.0–100.0)
Monocytes Absolute: 0.8 10*3/uL (ref 0.1–1.0)
Monocytes Relative: 10 %
Neutro Abs: 4.5 10*3/uL (ref 1.7–7.7)
Neutrophils Relative %: 59 %
Platelets: 277 10*3/uL (ref 150–400)
RBC: 4.68 MIL/uL (ref 3.87–5.11)
RDW: 15.6 % — ABNORMAL HIGH (ref 11.5–15.5)
WBC: 7.6 10*3/uL (ref 4.0–10.5)
nRBC: 0 % (ref 0.0–0.2)

## 2020-10-18 LAB — BASIC METABOLIC PANEL
Anion gap: 8 (ref 5–15)
BUN: 31 mg/dL — ABNORMAL HIGH (ref 8–23)
CO2: 27 mmol/L (ref 22–32)
Calcium: 8.8 mg/dL — ABNORMAL LOW (ref 8.9–10.3)
Chloride: 102 mmol/L (ref 98–111)
Creatinine, Ser: 1.59 mg/dL — ABNORMAL HIGH (ref 0.44–1.00)
GFR, Estimated: 33 mL/min — ABNORMAL LOW (ref >60)
Glucose, Bld: 234 mg/dL — ABNORMAL HIGH (ref 70–99)
Potassium: 5.1 mmol/L (ref 3.5–5.1)
Sodium: 137 mmol/L (ref 135–145)

## 2020-10-18 MED ORDER — ANASTROZOLE 1 MG PO TABS: 1.0000 mg | ORAL_TABLET | Freq: Every day | ORAL | 1 refills | Status: DC

## 2020-10-18 NOTE — Progress Notes (Signed)
[name] [place] CONSULT NOTE  Patient Care Team: [name], DO as PCP - General (Family Medicine) [name], [name], MD as PCP - Cardiology (Cardiology) [name] [name], MD as Consulting Physician (Cardiology) [name], MD as Consulting Physician (Urology) [name], MD as Consulting Physician (Nephrology) [name], MD as Referring Physician (Orthopedic Surgery) [name], MD as Attending Physician (Endocrinology) [name], MD as Referring Physician (Vascular Surgery) [name], [facility] as Pharmacist (Pharmacist) [name], RN as Case Manager (General Practice)  CHIEF COMPLAINTS/PURPOSE OF CONSULTATION: Breast cancer  #  Oncology History Overview Note   # July 2020 Left Breast- IMC; 14 mm; Grade: 2; Lymphovascular Invasion: Not identified;  pT1c pN0(sn); ER positive, PR positive, HER2 negative. [s/p lumpec; Dr.Byrnett]; rs/p RT [finished Sep, 9th 2020]  # Sep end 2020- START Arimidexa  # JAN 2022- 57m/left upper lobe lung nodule; PET positive; right upper lobe pneumonia/suspected reactive adenopathy. STAGE I Lung ca [suspected based on imaging no biopsy]- SBRT Dr.Crystal [May 2022]  # CKD-III/COPD/ CHF-compesated/PAD; CAD  DIAGNOSIS: Left breast cancer  STAGE: 1  ;GOALS: Cure  CURRENT/MOST RECENT THERAPY: arimidex   Malignant neoplasm of lower-inner quadrant of left female breast ([facility]  08/14/2018 Initial Diagnosis   Malignant neoplasm of lower-inner quadrant of left female breast ([facility]   Carcinoma of lower-inner quadrant of left breast in female, estrogen receptor positive ([place]  10/02/2018 Initial Diagnosis   Carcinoma of lower-inner quadrant of left breast in female, estrogen receptor positive ([place]      HISTORY OF PRESENTING ILLNESS:  ESara [id number].  female to history of stage I ER PR positive/HER-2 negative breast cancer ; and also stage I presumed left upper lobe lung cancer [NO bX]-s/p SBRT is  here for follow-up.  Patient is SBRT in June 2022.  Denies any posttreatment worsening cough or shortness of breath.  Patient has chronic shortness of breath mild cough not any worse.  Chronic back pain is chronic fatigue.  Denies any new lumps or bumps.  Admits to compliance with Arimidex.  Review of Systems  Constitutional:  Negative for chills, diaphoresis, fever, malaise/fatigue and weight loss.  HENT:  Negative for nosebleeds and sore throat.   Eyes:  Negative for double vision.  Respiratory:  Negative for cough, hemoptysis, sputum production, shortness of breath and wheezing.   Cardiovascular:  Negative for chest pain, palpitations, orthopnea and leg swelling.  Gastrointestinal:  Negative for abdominal pain, blood in stool, constipation, diarrhea, heartburn, melena, nausea and vomiting.  Genitourinary:  Negative for dysuria, frequency and urgency.  Musculoskeletal:  Positive for back pain and joint pain.  Skin: Negative.  Negative for itching and rash.  Neurological:  Negative for dizziness, tingling, focal weakness, weakness and headaches.  Endo/Heme/Allergies:  Does not bruise/bleed easily.  Psychiatric/Behavioral:  Negative for depression. The patient is not nervous/anxious and does not have insomnia.     MEDICAL HISTORY:  Past Medical History:  Diagnosis Date  . Asthma   . Benign neoplasm of colon   . Breast cancer (HW[place] 08/2018   left breast  . Cervical disc disease    "bulging" - no limitations per pt  . Chronic diastolic CHF (congestive heart failure) ([place]    a. 07/2012 Echo: Nl EF. mild inferoseptal HK, Gr 2 DD, mild conc LVH, mild PR/TR, mild PAH.  . CKD (chronic kidney disease), stage III ([place]   . COPD (chronic obstructive pulmonary disease) ([facility]   . Coronary  artery disease    a. 11/2011 Cath: LAD 23md, LCX min irregs, RCA 70p, 1068mith L->R collats, EF 60%-->Med Rx.  . Diabetes mellitus without complication ([facility]  . Fusion of lumbar spine 12/22/2015  . GERD  (gastroesophageal reflux disease)   . History of DVT (deep vein thrombosis)   . History of tobacco abuse    a. Quit 2011.  . Marland Kitchenyperlipidemia   . Hypertension   . Hypothyroidism   . Obesity   . Palpitations   . PE (pulmonary embolism) 10/15  . Personal history of radiation therapy   . PONV (postoperative nausea and vomiting)   . Pulmonary embolus ([place]/15/2015   RLL. Presented with SOB and elevated d-dimer.   . Marland KitchenVD (peripheral vascular disease) ([facility]   a. PTA of right leg and left femoral artery with stenosis.  . Renal insufficiency   . [facility]   TIAs - no deficits    SURGICAL HISTORY: Past Surgical History:  Procedure Laterality Date  . ANGIOPLASTY / STENTING FEMORAL     PVD; angioplasty right leg and left femoral artery with stenosis.   . APPENDECTOMY  Oct. 2016  . BACK SURGERY  03/2012   nerve stimulator inserted  . BACK SURGERY     screws, rods replaced with new hardware.  . Marland KitchenACK SURGERY  11/2016  . BREAST LUMPECTOMY Left 08/26/2018  . BREAST SURGERY    . CARDIAC CATHETERIZATION  12/07/2011   Mid LAD 50%, distal LAD 50%, mid RCA 70%, distal RCA 100%   . CARDIAC CATHETERIZATION  01/04/2011   100% occluded mid RCA with good collaterals from distal LAD, normal LVEF.  . Marland KitchenHOLECYSTECTOMY    . COLONOSCOPY  2013  . COLONOSCOPY WITH PROPOFOL N/A 05/24/2015   Procedure: COLONOSCOPY WITH PROPOFOL;  Surgeon: DaLucilla LameMD;  [facility] Service: Endoscopy;  Laterality: N/A;  Diabetic - oral meds  . COLONOSCOPY WITH PROPOFOL N/A 01/28/2019   Procedure: COLONOSCOPY WITH PROPOFOL;  Surgeon: TaVirgel ManifoldMD;  Location: [facility];  Service: Endoscopy;  Laterality: N/A;  . DIAGNOSTIC LAPAROSCOPY    . ESOPHAGOGASTRODUODENOSCOPY (EGD) WITH PROPOFOL N/A 01/28/2019   Procedure: ESOPHAGOGASTRODUODENOSCOPY (EGD) WITH PROPOFOL;  Surgeon: TaVirgel ManifoldMD;  Location: [facility] ENDOSCOPY;  Service: Endoscopy;  Laterality: N/A;  . LAPAROSCOPIC  APPENDECTOMY N/A 01/06/2015   Procedure: APPENDECTOMY LAPAROSCOPIC;  Surgeon: SeChristene LyeMD;  Location: [facility] ORS;  Service: General;  Laterality: N/A;  . LEFT HEART CATH AND CORONARY ANGIOGRAPHY Left 04/04/2019   Procedure: LEFT HEART CATH AND CORONARY ANGIOGRAPHY;  Surgeon: GoMinna MerrittsMD;  [facility];  Service: Cardiovascular;  Laterality: Left;  . Marland KitchenASTECTOMY W/ SENTINEL NODE BIOPSY Left 08/26/2018   Procedure: PARTIAL MASTECTOMY WIDE EXCISION WITH SENTINEL LYMPH NODE BIOPSY LEFT;  Surgeon: ByRobert BellowMD;  [facility]: [facility]S;  Service: General;  Laterality: Left;  . POLYPECTOMY  05/24/2015   Procedure: POLYPECTOMY;  Surgeon: DaLucilla LameMD;  [place]: [place] Service: Endoscopy;;  . THYROIDECTOMY    . TOTAL KNEE ARTHROPLASTY Right 11/13/2019  . TRIGGER FINGER RELEASE      SOCIAL HISTORY: Social History   Socioeconomic History  . Marital status: Married    Spouse name: Not on file  . Number of children: 2  . Years of education: some college, teProduction assistant, radio . Highest education level: Associate degree: occupational, teHotel manageror vocational program  Occupational History  . Occupation: Retired  Tobacco Use  . Smoking status: Former    Packs/day: 0.50    Years: 15.00    Pack years: 7.50    Types: Cigarettes    Quit date: 03/14/2007    Years since quitting: 13.6  . Smokeless tobacco: Never  Vaping Use  . Vaping Use: Never used  Substance and Sexual Activity  . Alcohol use: No    Alcohol/week: 0.0 standard drinks  . Drug use: Never  . Sexual activity: Yes    Birth control/protection: Post-menopausal  Other Topics Concern  . Not on file  Social History Narrative   Lives at home with husband in [place]. Quit smoking 10 years ago; never alcohol. 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    Social Determinants of Health   Financial Resource Strain: Low Risk    . Difficulty of Paying Living Expenses: Not hard at all  Food Insecurity: No Food Insecurity  . Worried About Charity fundraiser in the Last Year: Never true  . Ran Out of Food in the Last Year: Never true  Transportation Needs: No Transportation Needs  . Lack of Transportation (Medical): No  . Lack of Transportation (Non-Medical): No  Physical Activity: Sufficiently Active  . Days of Exercise per Week: 7 days  . Minutes of Exercise per Session: 60 min  Stress: No Stress Concern Present  . Feeling of Stress : Not at all  Social Connections: Socially Integrated  . Frequency of Communication with Friends and Family: More than three times a week  . Frequency of Social Gatherings with Friends and Family: More than three times a week  . Attends Religious Services: More than 4 times per year  . Active Member of Clubs or Organizations: Yes  . Attends Archivist Meetings: 1 to 4 times per year  . Marital Status: Married  Human resources officer Violence: Unknown  . Fear of Current or Ex-Partner: No  . Emotionally Abused: No  . Physically Abused: No  . Sexually Abused: Not on file    FAMILY HISTORY: Family History  Problem Relation Age of Onset  . Hypertension Father   . Heart disease Father   . Heart attack Father   . Diabetes Father   . Cancer Mother        colon  . Heart attack Brother   . Diabetes Brother   . Hypertension Brother   . Heart disease Brother   . Heart attack Brother   . Diabetes Brother   . Hypertension Brother   . Heart disease Brother   . Heart attack Brother   . Diabetes Brother   . Hypertension Brother   . Heart disease Brother   . Cancer Sister        lung  . COPD Sister   . COPD Sister   . Breast cancer Neg Hx     ALLERGIES:  is allergic to hydrocodone, aleve [naproxen sodium], contrast media [iodinated diagnostic agents], ioxaglate, oxycodone, ranexa [ranolazine], sulfa antibiotics, and tramadol.  MEDICATIONS:  Current Outpatient  Medications  Medication Sig Dispense Refill  . apixaban (ELIQUIS) 2.5 MG TABS tablet Take 1 tablet (2.5 mg total) by mouth 2 (two) times daily. 180 tablet 1  . blood glucose meter kit and supplies KIT Dispense based on patient and insurance preference. Use one time daily as directed, E11.9 1 each 0  . Calcium Carb-Cholecalciferol (CALCIUM 600 + D) 600-200 MG-UNIT TABS Take 1 tablet by mouth daily.     [name]  cetirizine (ZYRTEC) 10 MG tablet TAKE 1 TABLET BY MOUTH DAILY 30 tablet 0  . cholecalciferol (VITAMIN D3) 25 MCG (1000 UT) tablet Take 1,000 Units by mouth daily.    . cyanocobalamin (,VITAMIN B-12,) 1000 MCG/ML injection Inject 1,000 mcg into the muscle once a week. Then once a month x 4 months    . DULoxetine (CYMBALTA) 60 MG capsule Take 2 capsules (120 mg total) by mouth daily. 180 capsule 1  . empagliflozin (JARDIANCE) 25 MG TABS tablet Take 1 tablet (25 mg total) by mouth daily. TAKE ONE TABLET 25MG EVERY DAY BEFORE BREAKFAST 90 tablet 1  . gabapentin (NEURONTIN) 300 MG capsule Take 1 capsule (300 mg total) by mouth 3 (three) times daily. 270 capsule 3  . glipiZIDE (GLUCOTROL) 5 MG tablet TAKE ONE TABLET BY MOUTH EVERY DAY BEFORE BREAKFAST 90 tablet 1  . insulin degludec (TRESIBA FLEXTOUCH) 100 UNIT/ML FlexTouch Pen Inject 24 Units into the skin daily.    . Insulin Pen Needle (PEN NEEDLES 3/16") 31G X 5 MM MISC 1 each by Does not apply route daily. 100 each 12  . isosorbide mononitrate (IMDUR) 60 MG 24 hr tablet TAKE ONE TABLET BY MOUTH AT BEDTIME AND HALF A TABLET EVERY MORNING 135 tablet 1  . Lactobacillus (PROBIOTIC ACIDOPHILUS PO) Take 1 capsule by mouth daily at 12 noon.    . metoprolol succinate (TOPROL-XL) 50 MG 24 hr tablet TAKE 1 TABLET BY MOUTH DAILY WITH OR IMMEDIATLEY FOLLOWING A MEAL 90 tablet 1  . montelukast (SINGULAIR) 10 MG tablet Take 1 tablet (10 mg total) by mouth at bedtime. 90 tablet 1  . MYRBETRIQ 50 MG TB24 tablet TAKE 1 TABLET BY MOUTH DAILY 30 tablet 2  .  pantoprazole (PROTONIX) 40 MG tablet Take 1 tablet (40 mg total) by mouth daily. 90 tablet 1  . PROAIR HFA 108 (90 Base) MCG/ACT inhaler TAKE 2 PUFFS EVERY 4 HOURS AS NEEDED FORWHEEZING OR SHORTNESS OF BREATH 8.5 g 3  . Pumpkin Seed-Soy Germ (AZO BLADDER CONTROL/GO-LESS PO) Take 1 tablet by mouth in the morning and at bedtime.    . rosuvastatin (CRESTOR) 40 MG tablet Take 1 tablet (40 mg total) by mouth daily. 90 tablet 1  . senna (SENOKOT) 8.6 MG TABS tablet Take 8.6 mg by mouth daily as needed for mild constipation.    [name] SYNTHROID 150 MCG tablet TAKE ONE TABLET ON AN EMPTY STOMACH WITHA GLASS OF WATER AT LEAST 30 TO 60 MINUTES BEFORE BREAKFAST 30 tablet 0  . trimethoprim (TRIMPEX) 100 MG tablet TAKE ONE TABLET BY MOUTH EVERY DAY 90 tablet 1  . TRULICITY 4.5 ZO/1.0RU SOPN SMARTSIG:4.5 Milligram(s) SUB-Q Once a Week    . anastrozole (ARIMIDEX) 1 MG tablet Take 1 tablet (1 mg total) by mouth daily. 90 tablet 1  . nitroGLYCERIN (NITROSTAT) 0.4 MG SL tablet Place 1 tablet (0.4 mg total) under the tongue every 5 (five) minutes as needed for chest pain. (Patient not taking: Reported on 10/18/2020) 25 tablet 6   No current facility-administered medications for this visit.      [name]  PHYSICAL EXAMINATION: ECOG PERFORMANCE STATUS: 1 - Symptomatic but completely ambulatory  Vitals:   10/18/20 1400  BP: 121/64  Pulse: 73  Resp: 18  Temp: 99.3 F (37.4 C)   Filed Weights   10/18/20 1400  Weight: 215 lb (97.5 kg)    Physical Exam Constitutional:      Comments: Patient is ambulating with a cane.  She is accompanied by her husband.  HENT:     Head: Normocephalic and atraumatic.     Mouth/Throat:     Pharynx: No oropharyngeal exudate.  Eyes:     Pupils: Pupils are equal, round, and reactive to light.  Cardiovascular:     Rate and Rhythm: Normal rate and regular rhythm.  Pulmonary:     Effort: Pulmonary effort is normal. No respiratory distress.     Breath sounds: Normal breath sounds. No  wheezing.  Abdominal:     General: Bowel sounds are normal. There is no distension.     Palpations: Abdomen is soft. There is no mass.     Tenderness: no abdominal tenderness There is no guarding or rebound.  Musculoskeletal:        General: No tenderness. Normal range of motion.     Cervical back: Normal range of motion and neck supple.  Skin:    General: Skin is warm.     Comments: Right and left BREAST exam [in the presence of nurse]- no unusual skin changes or dominant masses felt. Surgical scars noted.    Neurological:     Mental Status: She is alert and oriented to person, place, and time.  Psychiatric:        Mood and Affect: Affect normal.   LABORATORY DATA:  I have reviewed the data as listed Lab Results  Component Value Date   WBC 7.6 10/18/2020   HGB 12.8 10/18/2020   HCT 41.4 10/18/2020   MCV 88.5 10/18/2020   PLT 277 10/18/2020   Recent Labs    11/11/19 1424 11/27/19 1356 01/19/20 1005 02/10/20 1440 04/17/20 0340 04/17/20 0450 04/18/20 0623 05/11/20 1504 07/19/20 1019 08/19/20 1015 09/30/20 1449 10/18/20 1418  NA 141 140   < > 144   < >  --  133*   < > 136 142 145* 137  K 4.9 5.4*   < > 5.2   < >  --  4.3   < > 4.9 5.2 5.6* 5.1  CL 102 102   < > 106   < >  --  104   < > 104 103 105 102  CO2 25 23   < > 23   < >  --  20*   < > 23 22 20 27   GLUCOSE 109* 108*   < > 84   < >  --  127*   < > 173* 158* 158* 234*  BUN 40* 28*   < > 23   < >  --  16   < > 34* 28* 24 31*  CREATININE 1.77* 1.61*   < > 1.75*   < >  --  1.57*   < > 1.73* 1.73* 1.54* 1.59*  CALCIUM 9.1 9.6   < > 9.4   < >  --  8.1*   < > 9.0 9.5 9.0 8.8*  GFRNONAA 28* 31*   < > 28*   < >  --  34*  --  30*  --   --  33*  GFRAA 32* 36*  --  32*  --   --   --   --   --   --   --   --   PROT 6.9  --    < > 7.2  --  6.8  --    < > 7.0 6.8 6.7  --   ALBUMIN 4.1  --    < > 4.3  --  3.3*  --    < >  3.6 4.3 4.3  --   AST 14  --    < > 15  --  15  --    < > 14* 17 13  --   ALT 10  --    < > 11  --  11  --     < > 11 11 10   --   ALKPHOS 80  --    < > 84  --  68  --    < > 75 113 104  --   BILITOT 0.4  --    < > 0.3  --  0.8  --    < > 0.8 0.4 0.3  --   BILIDIR  --   --   --   --   --  0.1  --   --   --   --   --   --   IBILI  --   --   --   --   --  0.7  --   --   --   --   --   --    < > = values in this interval not displayed.    RADIOGRAPHIC STUDIES: I have personally reviewed the radiological images as listed and agreed with the findings in the report. DG Cervical Spine Complete  Result Date: 10/01/2020 CLINICAL DATA:  Acute left-sided thoracic back pain. Left shoulder pain. History of cervical disc disease. EXAM: CERVICAL SPINE - COMPLETE 4+ VIEW COMPARISON:  None. FINDINGS: Trace anterolisthesis of C3 on C4. Slight broad-based rightward curvature. There is multilevel endplate spurring with associated disc space narrowing at C5-C6 and C6-C7. Mild to moderate multilevel facet hypertrophy. No evidence of fracture, focal lesion or bone destruction. Vertebral body heights are normal. No prevertebral soft tissue edema. IMPRESSION: 1. Multilevel degenerative disc disease and facet hypertrophy throughout the cervical spine. 2. Trace anterolisthesis of C3 on C4, likely degenerative. Electronically Signed   By: Keith Rake M.D.   On: 10/01/2020 19:53   DG Thoracic Spine W/Swimmers  Result Date: 10/01/2020 CLINICAL DATA:  Acute left-sided thoracic back pain. Left shoulder pain. EXAM: THORACIC SPINE - 3 VIEWS COMPARISON:  None. FINDINGS: Spinal stimulator is in place with leads at the level of T8-T9. The alignment is maintained. Vertebral body heights are maintained. Mild disc space narrowing and endplate spurring in the mid lower thoracic spine, most prominent at T10-T11. There is no evidence of fracture, focal bone lesion or bone destruction. Posterior elements appear intact. There is no paravertebral soft tissue abnormality. IMPRESSION: 1. Spinal stimulator in place with lead tips at the level of  T8-T9. 2. Mild degenerative disc disease in the mid and lower thoracic spine. Electronically Signed   By: Keith Rake M.D.   On: 10/01/2020 19:49   DG Shoulder Left  Result Date: 10/01/2020 CLINICAL DATA:  Acute left-sided thoracic back pain. Left shoulder pain. Pain below left scapula. EXAM: LEFT SHOULDER - 2+ VIEW COMPARISON:  None. FINDINGS: There is no evidence of fracture or dislocation. Mild acromioclavicular spurring with inferiorly directed osteophytes. Minimal glenohumeral spurring. Glenohumeral joint space is preserved. No erosion, bony destruction, or evidence of focal lesion. Soft tissues are unremarkable. IMPRESSION: Mild acromioclavicular and glenohumeral osteoarthritis. Electronically Signed   By: Keith Rake M.D.   On: 10/01/2020 19:50   VAS [place] ABI WITH/WO TBI  Result Date: 10/05/2020  LOWER EXTREMITY DOPPLER STUDY Patient Name:  Tammy [name]  Date of Exam:   10/05/2020 Medical Rec #: [id number]  Accession #:    [id number] Date of Birth: 1945-05-28     Patient Gender: F Patient Age:   53Y Exam Location:  [facility] Procedure:      VAS [place] ABI WITH/WO TBI Referring Phys: [id number] [place] --------------------------------------------------------------------------------  Indications: Peripheral artery disease.  Vascular Interventions: 07/11/10: Right superficial femoral artery PTA;                         07/16/11: Left superficial femoral artery PTA. Comparison Study: 10/10/2019 Performing Technologist: Charlane Ferretti RT (R)(VS)  Examination Guidelines: A complete evaluation includes at minimum, Doppler waveform signals and systolic blood pressure reading at the level of bilateral brachial, anterior tibial, and posterior tibial arteries, when vessel segments are accessible. Bilateral testing is considered an integral part of a complete examination. Photoelectric Plethysmograph (PPG) waveforms and toe systolic pressure readings are included as required and additional duplex  testing as needed. Limited examinations for reoccurring indications may be performed as noted.  ABI Findings: +---------+------------------+-----+---------+--------+ Right    Rt Pressure (mmHg)IndexWaveform Comment  +---------+------------------+-----+---------+--------+ Brachial 150                                      +---------+------------------+-----+---------+--------+ ATA      136               0.91 triphasic         +---------+------------------+-----+---------+--------+ PTA      140               0.93 triphasic         +---------+------------------+-----+---------+--------+ Great Toe110               0.73 Abnormal          +---------+------------------+-----+---------+--------+ +---------+------------------+-----+---------+----------+ Left     Lt Pressure (mmHg)IndexWaveform Comment    +---------+------------------+-----+---------+----------+ Brachial                                 Mastectomy +---------+------------------+-----+---------+----------+ ATA      123               0.82 triphasic           +---------+------------------+-----+---------+----------+ PTA      132               0.88 triphasic           +---------+------------------+-----+---------+----------+ Great Toe83                0.55 Abnormal            +---------+------------------+-----+---------+----------+ +-------+-----------+-----------+------------+------------+ ABI/TBIToday's ABIToday's TBIPrevious ABIPrevious TBI +-------+-----------+-----------+------------+------------+ Right  .93        .73        .95         .77          +-------+-----------+-----------+------------+------------+ Left   .88        .55        .88         .69          +-------+-----------+-----------+------------+------------+ Bilateral ABIs appear essentially unchanged compared to prior study on 10/10/2019. Bilateral TBIs appear essentially unchanged compared to prior study on 10/10/2019.  Summary: Right: Resting right ankle-brachial index indicates mild right lower extremity arterial disease. The right toe-brachial index is normal. Left: Resting left ankle-brachial index  indicates mild left lower extremity arterial disease. The left toe-brachial index is abnormal. *See table(s) above for measurements and observations.  Electronically signed by Leotis Pain MD on 10/05/2020 at 5:00:21 PM.    Final      ASSESSMENT & PLAN:   Carcinoma of lower-inner quadrant of left breast in female, estrogen receptor positive ([place]) #Left upper lobe lung nodule 6 mm MAY 2021-slow-growing currently 9 mm May 2022 CT scan.  Difficulty biopsy-peripheral/subpleural.  S/p SBRT [June 2022]; awaitimg CT scan in October.   # Left breast stage I breast cancer ER PR positive HER-2 negative; status post lumpectomy.  On arimidex. Mammogram-May 2022- left mammo-WNL.   # BMD- FEB 2021- T score;  - 1.5.on ca+vit D-STABLE.   # DM- on Insulin - PBF-234-  [Dr.Johnson]   # constipation: awaiting colonoscopy [[facility]]  # CKD- III- creat 1.5; GFR-33.[NOV 2021-]; S stable.- Dr.Kolluru/follow-up with PCP.  # DISPOSITION:  #  Follow-up in 3 months;MD;  labs-CBC BMP-Dr.B     All questions were answered. The patient/family knows to call the clinic with any problems, questions or concerns.       Cammie Sickle, MD 10/23/2020 6:52 PM

## 2020-10-18 NOTE — Assessment & Plan Note (Addendum)
#  Left upper lobe lung nodule 6 mm MAY 2021-slow-growing currently 9 mm May 2022 CT scan.  Difficulty biopsy-peripheral/subpleural.  S/p SBRT [June 2022]; awaitimg CT scan in October.   # Left breast stage I breast cancer ER PR positive HER-2 negative; status post lumpectomy.  On arimidex. Mammogram-May 2022- left mammo-WNL.   # BMD- FEB 2021- T score;  - 1.5.on ca+vit D-STABLE.   # DM- on Insulin - PBF-234-  [Dr.Johnson]   # constipation: awaiting colonoscopy [[facility]]  # CKD- III- creat 1.5; GFR-33.[NOV 2021-]; S stable.- Dr.Kolluru/follow-up with PCP.  # DISPOSITION:  #  Follow-up in 3 months;MD;  labs-CBC BMP-Dr.B

## 2020-10-27 ENCOUNTER — Ambulatory Visit: Payer: Self-pay | Admitting: *Deleted

## 2020-10-27 NOTE — Telephone Encounter (Signed)
Pt is still having uti symptoms since July 21 and has taken 2 rounds of cipro pt got some relief when taking abx and the symptoms never went away. Pt still have orange-ish urine and cloudy with burning. Please advise  Called patient to review symptoms. C/o pain with urination,  orange color of urine, pain in vaginal area with urination and low back pain . Noted low grade fever 99 at oncology appt . Reports completing 2 "rounds" of cipro and UTI symptoms have not went away since July 21. Patient would like to know if there is anything else she can do or take to treat UTI. Patient is going out of town 8/24-8/28. Earliest appt 11/11/20. Please advise if earlier appt available. Do you want patient to go to UC due to low back pain ? Patient would like to see PCP .care advise given. Patient verbalized understanding of care advise and to call back or go to [facility] or ED if symptoms worsen.

## 2020-10-27 NOTE — Telephone Encounter (Signed)
Routing to provider to advise.  

## 2020-10-27 NOTE — Telephone Encounter (Signed)
Reason for Disposition  Side (flank) or lower back pain present  Answer Assessment - Initial Assessment Questions 1. SYMPTOM: "What's the main symptom you're concerned about?" (e.g., frequency, incontinence)     UTI sx not going away , vaginal pain with urination, urine cloudy, orange in color, low back pain 2. ONSET: "When did the  back pain  start?"     Since July 21 3. PAIN: "Is there any pain?" If Yes, ask: "How bad is it?" (Scale: 1-10; mild, moderate, severe)     Yes  4. CAUSE: "What do you think is causing the symptoms?"     Not sure  5. OTHER SYMPTOMS: "Do you have any other symptoms?" (e.g., fever, flank pain, blood in urine, pain with urination)     Low grade fever , 99. Pain with urination , low back pain  6. PREGNANCY: "Is there any chance you are pregnant?" "When was your last menstrual period?"     na  Protocols used: Urinary Symptoms-A-AH

## 2020-10-28 ENCOUNTER — Ambulatory Visit (INDEPENDENT_AMBULATORY_CARE_PROVIDER_SITE_OTHER): Payer: Medicare Other | Admitting: Family Medicine

## 2020-10-28 ENCOUNTER — Encounter: Payer: Self-pay | Admitting: Family Medicine

## 2020-10-28 ENCOUNTER — Other Ambulatory Visit: Payer: Self-pay

## 2020-10-28 VITALS — BP 133/82 | HR 78 | Temp 98.0°F | Ht 62.99 in | Wt 214.0 lb

## 2020-10-28 DIAGNOSIS — I25110 Atherosclerotic heart disease of native coronary artery with unstable angina pectoris: Secondary | ICD-10-CM

## 2020-10-28 DIAGNOSIS — N3001 Acute cystitis with hematuria: Secondary | ICD-10-CM | POA: Diagnosis not present

## 2020-10-28 DIAGNOSIS — R6883 Chills (without fever): Secondary | ICD-10-CM

## 2020-10-28 DIAGNOSIS — R300 Dysuria: Secondary | ICD-10-CM

## 2020-10-28 DIAGNOSIS — R5383 Other fatigue: Secondary | ICD-10-CM | POA: Diagnosis not present

## 2020-10-28 LAB — URINALYSIS, ROUTINE W REFLEX MICROSCOPIC
Bilirubin, UA: NEGATIVE
Ketones, UA: NEGATIVE
Nitrite, UA: NEGATIVE
Specific Gravity, UA: 1.025 (ref 1.005–1.030)
Urobilinogen, Ur: 0.2 mg/dL (ref 0.2–1.0)
pH, UA: 5 (ref 5.0–7.5)

## 2020-10-28 LAB — MICROSCOPIC EXAMINATION

## 2020-10-28 MED ORDER — CIPROFLOXACIN HCL 500 MG PO TABS: 500.0000 mg | ORAL_TABLET | Freq: Two times a day (BID) | ORAL | 0 refills | Status: DC

## 2020-10-28 NOTE — Progress Notes (Signed)
BP 133/82   Pulse 78   Temp 98 F (36.7 C)   Ht 5' 2.99" (1.6 m)   Wt 214 lb (97.1 kg)   SpO2 96%   BMI 37.92 kg/m    Subjective:    Patient ID: Tammy [name], female    DOB: 05/29/1945, 75 y.o.   MRN: [id number]  HPI: Tammy [name] is a 75 y.o. female  Chief Complaint  Patient presents with   Urinary Tract Infection    Patient states that she just feels awful, weak, pain in her legs, nausea.     URINARY SYMPTOMS Duration: yesterday Dysuria: yes Urinary frequency: no Urgency: no Small volume voids: yes Symptom severity: mild Urinary incontinence: no Foul odor: no Hematuria: no Abdominal pain: yes Back pain: yes Suprapubic pain/pressure: yes Flank pain: yes Fever:  subjective Vomiting: yes Relief with cranberry juice: no Relief with pyridium: no Status: worse Previous urinary tract infection: yes Recurrent urinary tract infection: no Sexual activity: monogomous History of sexually transmitted disease: no Vaginal discharge: no Treatments attempted: increasing fluids    Relevant past medical, surgical, family and social history reviewed and updated as indicated. Interim medical history since our last visit reviewed. Allergies and medications reviewed and updated.  Review of Systems  Constitutional:  Positive for chills, diaphoresis and fatigue. Negative for activity change, appetite change, fever and unexpected weight change.  HENT: Negative.    Respiratory: Negative.    Cardiovascular: Negative.   Gastrointestinal: Negative.   Genitourinary:  Positive for dysuria, frequency and pelvic pain. Negative for decreased urine volume, difficulty urinating, dyspareunia, enuresis, flank pain, genital sores, hematuria, menstrual problem, urgency, vaginal bleeding, vaginal discharge and vaginal pain.  Musculoskeletal: Negative.   Neurological: Negative.   Psychiatric/Behavioral: Negative.     Per HPI unless specifically indicated above     Objective:    BP 133/82    Pulse 78   Temp 98 F (36.7 C)   Ht 5' 2.99" (1.6 m)   Wt 214 lb (97.1 kg)   SpO2 96%   BMI 37.92 kg/m   Wt Readings from Last 3 Encounters:  10/28/20 214 lb (97.1 kg)  10/18/20 215 lb (97.5 kg)  10/05/20 216 lb (98 kg)    Physical Exam Vitals and nursing note reviewed.  Constitutional:      General: She is not in acute distress.    Appearance: Normal appearance. She is obese. She is ill-appearing. She is not toxic-appearing or diaphoretic.  HENT:     Head: Normocephalic and atraumatic.     Right Ear: External ear normal.     Left Ear: External ear normal.     Nose: Nose normal.     Mouth/Throat:     Mouth: Mucous membranes are moist.     Pharynx: Oropharynx is clear.  Eyes:     General: No scleral icterus.       Right eye: No discharge.        Left eye: No discharge.     Extraocular Movements: Extraocular movements intact.     Conjunctiva/sclera: Conjunctivae normal.     Pupils: Pupils are equal, round, and reactive to light.  Cardiovascular:     Rate and Rhythm: Normal rate and regular rhythm.     Pulses: Normal pulses.     Heart sounds: Normal heart sounds. No murmur heard.   No friction rub. No gallop.  Pulmonary:     Effort: Pulmonary effort is normal. No respiratory distress.     Breath sounds:  Normal breath sounds. No stridor. No wheezing, rhonchi or rales.  Chest:     Chest wall: No tenderness.  Musculoskeletal:        General: Normal range of motion.     Cervical back: Normal range of motion and neck supple.  Skin:    General: Skin is warm and dry.     Capillary Refill: Capillary refill takes less than 2 seconds.     Coloration: Skin is not jaundiced or pale.     Findings: No bruising, erythema, lesion or rash.  Neurological:     General: No focal deficit present.     Mental Status: She is alert and oriented to person, place, and time. Mental status is at baseline.  Psychiatric:        Mood and Affect: Mood normal.        Behavior: Behavior normal.         Thought Content: Thought content normal.        Judgment: Judgment normal.    Results for orders placed or performed in visit on 10/28/20  Microscopic Examination   Urine  Result Value Ref Range   WBC, UA 11-30 (A) 0 - 5 /hpf   RBC 11-30 (A) 0 - 2 /hpf   Epithelial Cells (non renal) 0-10 0 - 10 /hpf   Bacteria, UA Many (A) None seen/Few  CBC with Differential/Platelet  Result Value Ref Range   WBC 11.6 (H) 3.4 - 10.8 x10E3/uL   RBC 5.12 3.77 - 5.28 x10E6/uL   Hemoglobin 13.8 11.1 - 15.9 g/dL   Hematocrit 44.1 34.0 - 46.6 %   MCV 86 79 - 97 fL   MCH 27.0 26.6 - 33.0 pg   MCHC 31.3 (L) 31.5 - 35.7 g/dL   RDW 14.6 11.7 - 15.4 %   Platelets 311 150 - 450 x10E3/uL   Neutrophils 67 Not Estab. %   Lymphs 18 Not Estab. %   Monocytes 11 Not Estab. %   Eos 2 Not Estab. %   Basos 1 Not Estab. %   Neutrophils Absolute 7.7 (H) 1.4 - 7.0 x10E3/uL   Lymphocytes Absolute 2.1 0.7 - 3.1 x10E3/uL   Monocytes Absolute 1.3 (H) 0.1 - 0.9 x10E3/uL   EOS (ABSOLUTE) 0.3 0.0 - 0.4 x10E3/uL   Basophils Absolute 0.1 0.0 - 0.2 x10E3/uL   Immature Granulocytes 1 Not Estab. %   Immature Grans (Abs) 0.1 0.0 - 0.1 x10E3/uL  Comprehensive metabolic panel  Result Value Ref Range   Glucose 176 (H) 65 - 99 mg/dL   BUN 28 (H) 8 - 27 mg/dL   Creatinine, Ser 1.66 (H) 0.57 - 1.00 mg/dL   eGFR 32 (L) >59 mL/min/1.73   BUN/Creatinine Ratio 17 12 - 28   Sodium 143 134 - 144 mmol/L   Potassium 5.9 (H) 3.5 - 5.2 mmol/L   Chloride 106 96 - 106 mmol/L   CO2 22 20 - 29 mmol/L   Calcium 10.0 8.7 - 10.3 mg/dL   Total Protein 7.2 6.0 - 8.5 g/dL   Albumin 4.5 3.7 - 4.7 g/dL   Globulin, Total 2.7 1.5 - 4.5 g/dL   Albumin/Globulin Ratio 1.7 1.2 - 2.2   Bilirubin Total 0.4 0.0 - 1.2 mg/dL   Alkaline Phosphatase 92 44 - 121 IU/L   AST 14 0 - 40 IU/L   ALT 10 0 - 32 IU/L  Urinalysis, Routine w reflex microscopic  Result Value Ref Range   Specific Gravity, UA 1.025 1.005 - 1.030  pH, UA 5.0 5.0 - 7.5   Color,  UA Yellow Yellow   Appearance Ur Cloudy (A) Clear   Leukocytes,UA Trace (A) Negative   Protein,UA 1+ (A) Negative/Trace   Glucose, UA 3+ (A) Negative   Ketones, UA Negative Negative   RBC, UA 3+ (A) Negative   Bilirubin, UA Negative Negative   Urobilinogen, Ur 0.2 0.2 - 1.0 mg/dL   Nitrite, UA Negative Negative   Microscopic Examination See below:       Assessment & Plan:   Problem List Items Addressed This Visit   None Visit Diagnoses     Acute cystitis with hematuria    -  Primary   + UA- will await culture and treat with cipro. Call with any concerns or if not getting better.    Relevant Orders   Urine Culture   Fatigue, unspecified type       EKG normal. Will check due to UTI. Treat and see how she does.    Relevant Orders   CBC with Differential/Platelet (Completed)   Comprehensive metabolic panel (Completed)   Urinalysis, Routine w reflex microscopic (Completed)   EKG 12-Lead (Completed)   Dysuria       Will treat UTI   Relevant Orders   Urinalysis, Routine w reflex microscopic (Completed)   Chills       Will treat UTI   Relevant Orders   Novel Coronavirus, NAA ([facility])        Follow up plan: Return if symptoms worsen or fail to improve.

## 2020-10-28 NOTE — Telephone Encounter (Signed)
OK to come in this AM

## 2020-10-29 ENCOUNTER — Other Ambulatory Visit: Payer: Self-pay | Admitting: Family Medicine

## 2020-10-29 ENCOUNTER — Encounter: Payer: Self-pay | Admitting: Family Medicine

## 2020-10-29 DIAGNOSIS — N289 Disorder of kidney and ureter, unspecified: Secondary | ICD-10-CM

## 2020-10-29 DIAGNOSIS — D72829 Elevated white blood cell count, unspecified: Secondary | ICD-10-CM

## 2020-10-29 LAB — CBC WITH DIFFERENTIAL/PLATELET
Basophils Absolute: 0.1 10*3/uL (ref 0.0–0.2)
Basos: 1 %
EOS (ABSOLUTE): 0.3 10*3/uL (ref 0.0–0.4)
Eos: 2 %
Hematocrit: 44.1 % (ref 34.0–46.6)
Hemoglobin: 13.8 g/dL (ref 11.1–15.9)
Immature Grans (Abs): 0.1 10*3/uL (ref 0.0–0.1)
Immature Granulocytes: 1 %
Lymphocytes Absolute: 2.1 10*3/uL (ref 0.7–3.1)
Lymphs: 18 %
MCH: 27 pg (ref 26.6–33.0)
MCHC: 31.3 g/dL — ABNORMAL LOW (ref 31.5–35.7)
MCV: 86 fL (ref 79–97)
Monocytes Absolute: 1.3 10*3/uL — ABNORMAL HIGH (ref 0.1–0.9)
Monocytes: 11 %
Neutrophils Absolute: 7.7 10*3/uL — ABNORMAL HIGH (ref 1.4–7.0)
Neutrophils: 67 %
Platelets: 311 10*3/uL (ref 150–450)
RBC: 5.12 x10E6/uL (ref 3.77–5.28)
RDW: 14.6 % (ref 11.7–15.4)
WBC: 11.6 10*3/uL — ABNORMAL HIGH (ref 3.4–10.8)

## 2020-10-29 LAB — COMPREHENSIVE METABOLIC PANEL
ALT: 10 IU/L (ref 0–32)
AST: 14 IU/L (ref 0–40)
Albumin/Globulin Ratio: 1.7 (ref 1.2–2.2)
Albumin: 4.5 g/dL (ref 3.7–4.7)
Alkaline Phosphatase: 92 IU/L (ref 44–121)
BUN/Creatinine Ratio: 17 (ref 12–28)
BUN: 28 mg/dL — ABNORMAL HIGH (ref 8–27)
Bilirubin Total: 0.4 mg/dL (ref 0.0–1.2)
CO2: 22 mmol/L (ref 20–29)
Calcium: 10 mg/dL (ref 8.7–10.3)
Chloride: 106 mmol/L (ref 96–106)
Creatinine, Ser: 1.66 mg/dL — ABNORMAL HIGH (ref 0.57–1.00)
Globulin, Total: 2.7 g/dL (ref 1.5–4.5)
Glucose: 176 mg/dL — ABNORMAL HIGH (ref 65–99)
Potassium: 5.9 mmol/L — ABNORMAL HIGH (ref 3.5–5.2)
Sodium: 143 mmol/L (ref 134–144)
Total Protein: 7.2 g/dL (ref 6.0–8.5)
eGFR: 32 mL/min/{1.73_m2} — ABNORMAL LOW (ref >59)

## 2020-10-29 LAB — NOVEL CORONAVIRUS, NAA: SARS-CoV-2, NAA: NOT DETECTED

## 2020-10-29 LAB — SARS-COV-2, NAA 2 DAY TAT

## 2020-10-29 NOTE — Progress Notes (Signed)
Interpreted by me on 10/28/20 NSR at 83bpm with PAC, no st segment changes

## 2020-11-01 LAB — URINE CULTURE

## 2020-11-02 ENCOUNTER — Other Ambulatory Visit: Payer: Self-pay

## 2020-11-02 ENCOUNTER — Other Ambulatory Visit: Payer: Medicare Other

## 2020-11-02 DIAGNOSIS — N289 Disorder of kidney and ureter, unspecified: Secondary | ICD-10-CM | POA: Diagnosis not present

## 2020-11-02 DIAGNOSIS — D72829 Elevated white blood cell count, unspecified: Secondary | ICD-10-CM | POA: Diagnosis not present

## 2020-11-03 ENCOUNTER — Other Ambulatory Visit: Payer: Self-pay | Admitting: Family Medicine

## 2020-11-03 DIAGNOSIS — E875 Hyperkalemia: Secondary | ICD-10-CM

## 2020-11-03 LAB — CBC WITH DIFFERENTIAL/PLATELET
Basophils Absolute: 0.1 10*3/uL (ref 0.0–0.2)
Basos: 1 %
EOS (ABSOLUTE): 0.2 10*3/uL (ref 0.0–0.4)
Eos: 2 %
Hematocrit: 39.4 % (ref 34.0–46.6)
Hemoglobin: 12.6 g/dL (ref 11.1–15.9)
Immature Grans (Abs): 0.1 10*3/uL (ref 0.0–0.1)
Immature Granulocytes: 1 %
Lymphocytes Absolute: 2.1 10*3/uL (ref 0.7–3.1)
Lymphs: 26 %
MCH: 27.3 pg (ref 26.6–33.0)
MCHC: 32 g/dL (ref 31.5–35.7)
MCV: 85 fL (ref 79–97)
Monocytes Absolute: 0.9 10*3/uL (ref 0.1–0.9)
Monocytes: 12 %
Neutrophils Absolute: 4.7 10*3/uL (ref 1.4–7.0)
Neutrophils: 58 %
Platelets: 274 10*3/uL (ref 150–450)
RBC: 4.62 x10E6/uL (ref 3.77–5.28)
RDW: 14.6 % (ref 11.7–15.4)
WBC: 8 10*3/uL (ref 3.4–10.8)

## 2020-11-03 LAB — BASIC METABOLIC PANEL
BUN/Creatinine Ratio: 19 (ref 12–28)
BUN: 30 mg/dL — ABNORMAL HIGH (ref 8–27)
CO2: 19 mmol/L — ABNORMAL LOW (ref 20–29)
Calcium: 8.6 mg/dL — ABNORMAL LOW (ref 8.7–10.3)
Chloride: 103 mmol/L (ref 96–106)
Creatinine, Ser: 1.62 mg/dL — ABNORMAL HIGH (ref 0.57–1.00)
Glucose: 158 mg/dL — ABNORMAL HIGH (ref 65–99)
Potassium: 5.3 mmol/L — ABNORMAL HIGH (ref 3.5–5.2)
Sodium: 140 mmol/L (ref 134–144)
eGFR: 33 mL/min/{1.73_m2} — ABNORMAL LOW (ref >59)

## 2020-11-08 ENCOUNTER — Ambulatory Visit: Payer: Medicare Other | Admitting: Family Medicine

## 2020-11-08 ENCOUNTER — Other Ambulatory Visit: Payer: Self-pay | Admitting: Family Medicine

## 2020-11-08 NOTE — Telephone Encounter (Signed)
Future visit in 1 month. Last TSH lab 11/11/19

## 2020-11-09 ENCOUNTER — Other Ambulatory Visit: Payer: Self-pay

## 2020-11-09 ENCOUNTER — Other Ambulatory Visit: Payer: Medicare Other

## 2020-11-09 DIAGNOSIS — E875 Hyperkalemia: Secondary | ICD-10-CM

## 2020-11-09 DIAGNOSIS — E119 Type 2 diabetes mellitus without complications: Secondary | ICD-10-CM | POA: Diagnosis not present

## 2020-11-09 LAB — HM DIABETES EYE EXAM

## 2020-11-10 ENCOUNTER — Encounter: Payer: Self-pay | Admitting: Nurse Practitioner

## 2020-11-10 ENCOUNTER — Ambulatory Visit (INDEPENDENT_AMBULATORY_CARE_PROVIDER_SITE_OTHER): Payer: Medicare Other | Admitting: Nurse Practitioner

## 2020-11-10 VITALS — BP 125/70 | HR 81 | Ht 63.0 in | Wt 214.8 lb

## 2020-11-10 DIAGNOSIS — I10 Essential (primary) hypertension: Secondary | ICD-10-CM | POA: Diagnosis not present

## 2020-11-10 DIAGNOSIS — E785 Hyperlipidemia, unspecified: Secondary | ICD-10-CM | POA: Diagnosis not present

## 2020-11-10 DIAGNOSIS — I25118 Atherosclerotic heart disease of native coronary artery with other forms of angina pectoris: Secondary | ICD-10-CM | POA: Diagnosis not present

## 2020-11-10 DIAGNOSIS — I25110 Atherosclerotic heart disease of native coronary artery with unstable angina pectoris: Secondary | ICD-10-CM | POA: Diagnosis not present

## 2020-11-10 DIAGNOSIS — I5032 Chronic diastolic (congestive) heart failure: Secondary | ICD-10-CM

## 2020-11-10 DIAGNOSIS — N183 Chronic kidney disease, stage 3 unspecified: Secondary | ICD-10-CM

## 2020-11-10 DIAGNOSIS — I739 Peripheral vascular disease, unspecified: Secondary | ICD-10-CM | POA: Diagnosis not present

## 2020-11-10 DIAGNOSIS — Z86711 Personal history of pulmonary embolism: Secondary | ICD-10-CM

## 2020-11-10 LAB — BASIC METABOLIC PANEL
BUN/Creatinine Ratio: 16 (ref 12–28)
BUN: 26 mg/dL (ref 8–27)
CO2: 23 mmol/L (ref 20–29)
Calcium: 8.9 mg/dL (ref 8.7–10.3)
Chloride: 103 mmol/L (ref 96–106)
Creatinine, Ser: 1.66 mg/dL — ABNORMAL HIGH (ref 0.57–1.00)
Glucose: 185 mg/dL — ABNORMAL HIGH (ref 65–99)
Potassium: 4.8 mmol/L (ref 3.5–5.2)
Sodium: 142 mmol/L (ref 134–144)
eGFR: 32 mL/min/{1.73_m2} — ABNORMAL LOW (ref >59)

## 2020-11-10 MED ORDER — NITROGLYCERIN 0.4 MG SL SUBL: 0.4000 mg | SUBLINGUAL_TABLET | SUBLINGUAL | 6 refills | Status: DC | PRN

## 2020-11-10 NOTE — Progress Notes (Signed)
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  Past Medical History    Past Medical History:  Diagnosis Date   Asthma    Benign neoplasm of colon    Breast cancer ([place]) 08/2018   left breast   Cervical disc disease    "bulging" - no limitations per pt   Chronic diastolic CHF (congestive heart failure) ([place])    a. 07/2012 Echo: Nl EF. mild inferoseptal HK, Gr 2 DD, mild conc LVH, mild PR/TR, mild PAH; b. 08/2019 Echo: EF 55-60%, no rwma, Gr1 DD. Nl RV size/fxn.   CKD (chronic kidney disease), stage III (HCC)    COPD (chronic obstructive pulmonary disease) (HCC)    Coronary artery disease    a. 11/2011 Cath: LAD 70md, LCX min irregs, RCA 70p, 1015mith L->R collats, EF 60%-->Med Rx; b. 03/2019 Cath: LM nl, LAD 40-5020mCX large, mild diff dzs. OM1/2 nl. RCA known to be 100m72m->R collats from LAD & LCX/OM.   Diabetes mellitus without complication (HCC)    Fusion of lumbar spine 12/22/2015   GERD (gastroesophageal reflux disease)    History of DVT (deep vein thrombosis)    History of tobacco abuse    a. Quit 2011.   Hyperlipidemia    Hypertension    Hypothyroidism    Lung cancer (HCC)[place] Obesity    Palpitations    Personal history of radiation therapy    PONV (postoperative nausea and vomiting)    Pulmonary embolus (HCC)[place]/15/2015   RLL. Presented with SOB and elevated d-dimer.    PVD (peripheral vascular disease) (HCC)[place] a. PTA of right leg and left femoral artery with stenosis; b. 09/2020 ABI: R 0.93, L 0.88 - unchanged from  prior study.   [facility]   TIAs - no deficits   Past Surgical History:  Procedure Laterality Date   ANGIOPLASTY / STENTING FEMORAL     PVD; angioplasty right leg and left femoral artery with stenosis.    APPENDECTOMY  12/2014   BACK SURGERY  03/2012   nerve stimulator inserted   BACK SURGERY     screws, rods replaced with new hardware.   BACK SURGERY  11/2016   BREAST LUMPECTOMY Left 08/26/2018   BREAST SURGERY     CARDIAC CATHETERIZATION  12/07/2011   Mid LAD 50%, distal LAD 50%, mid RCA 70%, distal RCA 100%    CARDIAC CATHETERIZATION  01/04/2011   100% occluded mid RCA with good collaterals from distal LAD, normal LVEF.   CHOLECYSTECTOMY     COLONOSCOPY  2013   COLONOSCOPY WITH PROPOFOL N/A 05/24/2015   Procedure: COLONOSCOPY WITH PROPOFOL;  Surgeon: DarrLucilla Lame;  [facility];  Laterality: N/A;  Diabetic - oral meds   COLONOSCOPY WITH PROPOFOL N/A 01/28/2019   Procedure: COLONOSCOPY WITH PROPOFOL;  Surgeon: TahiVirgel Manifold;  Location: [facility] ENDOSCOPY;  Service: Endoscopy;  Laterality: N/A;   DIAGNOSTIC LAPAROSCOPY     ESOPHAGOGASTRODUODENOSCOPY (EGD) WITH PROPOFOL N/A 01/28/2019   Procedure: ESOPHAGOGASTRODUODENOSCOPY (EGD) WITH PROPOFOL;  Surgeon: [name], MD;  Location[facility] ENDOSCOPY;  Service: Endoscopy;  Laterality: N/A;   LAPAROSCOPIC APPENDECTOMY N/A 01/06/2015   Procedure: APPENDECTOMY LAPAROSCOPIC;  Surgeon: [name], MD;  Location: [facility] ORS;  Service: General;  Laterality: N/A;   LEFT HEART CATH AND CORONARY ANGIOGRAPHY Left 04/04/2019   Procedure: LEFT HEART CATH AND CORONARY ANGIOGRAPHY;  Surgeon: [name], MD;  Location: [place] CV LAB;  Service: Cardiovascular;  Laterality: Left;   MASTECTOMY W/ SENTINEL NODE BIOPSY Left 08/26/2018   Procedure: PARTIAL MASTECTOMY WIDE EXCISION WITH SENTINEL LYMPH NODE BIOPSY LEFT;  Surgeon: [name], MD;  Location: [facility] ORS;   Service: General;  Laterality: Left;   POLYPECTOMY  05/24/2015   Procedure: POLYPECTOMY;  Surgeon: [name], MD;  Location: [place];  Service: Endoscopy;;   REPLACEMENT TOTAL KNEE Right    THYROIDECTOMY     TOTAL KNEE ARTHROPLASTY Right 11/13/2019   TRIGGER FINGER RELEASE      Allergies  Allergies  Allergen Reactions   Hydrocodone Other (See Comments)    Unresponsive   Aleve [Naproxen Sodium]     Hives & itching    Contrast Media [Iodinated Diagnostic Agents]     Pt avoids due to kidney issues   Ioxaglate Other (See Comments)    (iodine) Pt avoids due to kidney issues    Oxycodone Other (See Comments)    hallucinations   Ranexa [Ranolazine]     Sick on stomach   Sulfa Antibiotics Other (See Comments)    Pt avoids due to kidney issues   Tramadol Other (See Comments)    nightmares    History of Present Illness    75 year old female with a history of CAD with chronic total occlusion of the RCA, peripheral arterial disease, HFpEF, remote tobacco abuse, COPD, hypertension, hyperlipidemia, stage III chronic kidney disease, diabetes, TIA, multiple back surgeries, 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  She previously underwent diagnostic catheterizations in 2012 and again in September 2013, which revealed total occlusion of the mid RCA with left-to-right collaterals.  In the setting of dyspnea and chest pain, she underwent repeat catheterization in January 2021 which showed stable anatomy, and she was medically managed.  Echo in June 2021 showed an EF of 55 to 60% with grade 1 diastolic dysfunction.  Most recent ABIs in July 2022 were stable.  Ms. Bento was last seen in cardiology clinic in June 2021, at which time she was doing well.  She subsequently underwent total knee arthroplasty, which she tolerated well.  More recently, she was diagnosed with lung cancer.  She underwent radiation is due for follow-up CT this October.  From a cardiac standpoint, she notes  stability in symptoms.  She still experiences mild chest discomfort 2-3 times per week, almost exclusively occurring at rest, as her exertion is very limited, lasting a few minutes, and resolving spontaneously.  About 2-3 times a year, she will take a sublingual nitroglycerin for symptoms that last longer than 30 minutes and last required nitroglycerin about 3 months ago.  In discussing her symptoms, there is no significant change in character, frequency, or duration dating back to prior to her catheterization in January 2021.  Further, chest discomfort is often associated with mild tenderness and soreness.  She has chronic, stable dyspnea on exertion in the setting of significant deconditioning.  She spends much of the day sitting as she has chronic back and knee pain, which limits her activity.  She denies palpitations, PND, orthopnea,  dizziness, syncope, edema, or early satiety.  Home Medications    Current Outpatient Medications  Medication Sig Dispense Refill   anastrozole (ARIMIDEX) 1 MG tablet Take 1 tablet (1 mg total) by mouth daily. 90 tablet 1   apixaban (ELIQUIS) 2.5 MG TABS tablet Take 1 tablet (2.5 mg total) by mouth 2 (two) times daily. 180 tablet 1   blood glucose meter kit and supplies KIT Dispense based on patient and insurance preference. Use one time daily as directed, E11.9 1 each 0   Calcium Carb-Cholecalciferol (CALCIUM 600 + D) 600-200 MG-UNIT TABS Take 1 tablet by mouth daily.      cetirizine (ZYRTEC) 10 MG tablet TAKE 1 TABLET BY MOUTH DAILY 30 tablet 0   cholecalciferol (VITAMIN D3) 25 MCG (1000 UT) tablet Take 1,000 Units by mouth daily.     DULoxetine (CYMBALTA) 60 MG capsule Take 2 capsules (120 mg total) by mouth daily. 180 capsule 1   empagliflozin (JARDIANCE) 25 MG TABS tablet Take 1 tablet (25 mg total) by mouth daily. TAKE ONE TABLET 25MG EVERY DAY BEFORE BREAKFAST 90 tablet 1   gabapentin (NEURONTIN) 300 MG capsule Take 1 capsule (300 mg total) by mouth 3 (three)  times daily. 270 capsule 3   glipiZIDE (GLUCOTROL) 5 MG tablet TAKE ONE TABLET BY MOUTH EVERY DAY BEFORE BREAKFAST 90 tablet 1   insulin degludec (TRESIBA FLEXTOUCH) 100 UNIT/ML FlexTouch Pen Inject 24 Units into the skin daily.     Insulin Pen Needle (PEN NEEDLES 3/16") 31G X 5 MM MISC 1 each by Does not apply route daily. 100 each 12   isosorbide mononitrate (IMDUR) 60 MG 24 hr tablet TAKE ONE TABLET BY MOUTH AT BEDTIME AND HALF A TABLET EVERY MORNING (Patient taking differently: TAKE ONE TABLET BY MOUTH AT BEDTIME) 135 tablet 1   Lactobacillus (PROBIOTIC ACIDOPHILUS PO) Take 1 capsule by mouth daily at 12 noon.     metoprolol succinate (TOPROL-XL) 50 MG 24 hr tablet TAKE 1 TABLET BY MOUTH DAILY WITH OR IMMEDIATLEY FOLLOWING A MEAL 90 tablet 1   montelukast (SINGULAIR) 10 MG tablet Take 1 tablet (10 mg total) by mouth at bedtime. 90 tablet 1   MYRBETRIQ 50 MG TB24 tablet TAKE 1 TABLET BY MOUTH DAILY 30 tablet 2   nitroGLYCERIN (NITROSTAT) 0.4 MG SL tablet Place 1 tablet (0.4 mg total) under the tongue every 5 (five) minutes as needed for chest pain. 25 tablet 6   pantoprazole (PROTONIX) 40 MG tablet Take 1 tablet (40 mg total) by mouth daily. 90 tablet 1   PROAIR HFA 108 (90 Base) MCG/ACT inhaler TAKE 2 PUFFS EVERY 4 HOURS AS NEEDED FORWHEEZING OR SHORTNESS OF BREATH 8.5 g 3   Pumpkin Seed-Soy Germ (AZO BLADDER CONTROL/GO-LESS PO) Take 1 tablet by mouth in the morning and at bedtime.     rosuvastatin (CRESTOR) 40 MG tablet Take 1 tablet (40 mg total) by mouth daily. 90 tablet 1   senna (SENOKOT) 8.6 MG TABS tablet Take 8.6 mg by mouth daily as needed for mild constipation.     SYNTHROID 150 MCG tablet TAKE ONE TABLET ON AN EMPTY STOMACH WITHA GLASS OF WATER AT LEAST 30 TO 60 MINUTES BEFORE BREAKFAST 30 tablet 0   trimethoprim (TRIMPEX) 100 MG tablet TAKE ONE TABLET BY MOUTH EVERY DAY 90 tablet 1   TRULICITY 4.5 DQ/2.2WL SOPN SMARTSIG:4.5 Milligram(s) SUB-Q Once a Week     ciprofloxacin (CIPRO)  500 MG tablet Take 1 tablet (500 mg total) by mouth 2 (two)  times daily. (Patient not taking: Reported on 11/10/2020) 14 tablet 0   cyanocobalamin (,VITAMIN B-12,) 1000 MCG/ML injection Inject 1,000 mcg into the muscle once a week. Then once a month x 4 months (Patient not taking: Reported on 11/10/2020)     No current facility-administered medications for this visit.     Review of Systems    Somewhat chronic intermittent chest discomfort at rest associated with chest wall tenderness on many occasions.  She has chronic dyspnea on exertion.  She is chronic back and knee pain which limit activity significantly.  She denies palpitations, PND, orthopnea, dizziness, syncope, edema, or early satiety.  All other systems reviewed and are otherwise negative except as noted above.  Physical Exam    VS:  BP 125/70 (BP Location: Right Arm, Patient Position: Sitting)   Pulse 81   Ht _0  (1.6 m)   Wt 214 lb 12.8 oz (97.4 kg)   SpO2 97%   BMI 38.05 kg/m  , BMI Body mass index is 38.05 kg/m.     GEN: Well nourished, well developed, in no acute distress. HEENT: normal. Neck: Supple, no JVD, carotid bruits, or masses. Cardiac: RRR, no murmurs, rubs, or gallops. No clubbing, cyanosis, edema.  Radials 2+/PT 1+ and equal bilaterally.  Respiratory:  Respirations regular and unlabored, clear to auscultation bilaterally. GI: Soft, nontender, nondistended, BS + x 4. MS: no deformity or atrophy. Skin: warm and dry, no rash. Neuro:  Strength and sensation are intact. Psych: Normal affect.  Accessory Clinical Findings    ECG personally reviewed by me today -sinus rhythm, 81, PACs, left axis deviation, LVH, poor R wave progression- no acute changes.  Lab Results  Component Value Date   WBC 8.0 11/02/2020   HGB 12.6 11/02/2020   HCT 39.4 11/02/2020   MCV 85 11/02/2020   PLT 274 11/02/2020   Lab Results  Component Value Date   CREATININE 1.66 (H) 11/09/2020   BUN 26 11/09/2020   NA 142 11/09/2020    K 4.8 11/09/2020   CL 103 11/09/2020   CO2 23 11/09/2020   Lab Results  Component Value Date   ALT 10 10/28/2020   AST 14 10/28/2020   ALKPHOS 92 10/28/2020   BILITOT 0.4 10/28/2020   Lab Results  Component Value Date   CHOL 152 08/19/2020   HDL 53 08/19/2020   LDLCALC 71 08/19/2020   TRIG 165 (H) 08/19/2020   CHOLHDL 3.2 09/07/2015    Lab Results  Component Value Date   HGBA1C 7.9 (H) 08/19/2020    Assessment & Plan    1.  Coronary artery disease: Patient with somewhat chronic left-sided chest discomfort that occurs with rest 2-3 times per week, lasting a few minutes to 30 minutes, and typically resolving spontaneously.  There are not necessarily associated symptoms though she does have chronic, stable dyspnea on exertion.  She rarely requires a sublingual nitroglycerin tablet.  Overall, symptoms are unchanged since prior to her most recent catheterization in January 2021.  She notes today that chest pain symptoms are often associated with tenderness and soreness.  In the setting of stable symptoms with question of chest wall symptoms, continue current medical therapy for coronary artery disease including a chronic total occlusion of the right coronary artery with good left to right collaterals.  She remains on beta-blocker, nitrate, and statin therapy.  No aspirin in the setting of chronic Eliquis.    2.  Chronic HFpEF: EF 55 to 60% by echo in June 2021.  She has chronic dyspnea on exertion though this has been stable.  She is euvolemic on examination today.  Heart rate and blood pressure stable.  Continue current regimen including beta-blocker and empagliflozin.  3.  Essential hypertension: Stable on beta-blocker therapy.  4.  Hyperlipidemia: Remains on statin therapy with an LDL of 71.  We discussed dietary adjustments and weight loss instead of adding additional agent.  5.  Peripheral arterial disease: Stable ABIs in July.  Denies claudication, though activity is very  limited.  6.  Stage III chronic kidney disease: Creatinine relatively stable at 1.66 yesterday  7.  Type 2 diabetes mellitus: A1c 7.9 in June.  She is followed closely by primary care and remains on Trulicity, empagliflozin, glipizide, and Tresiba.  8.  History of DVT/PE: On chronic Eliquis 2.5 mg twice daily.  9.  Disposition: Follow-up in clinic in 1 month or sooner if necessary.   Murray Hodgkins, NP 11/10/2020, 11:02 AM

## 2020-11-10 NOTE — Patient Instructions (Signed)
Medication Instructions:  No changes at this time.   *If you need a refill on your cardiac medications before your next appointment, please call your pharmacy*   Lab Work: None  If you have labs (blood work) drawn today and your tests are completely normal, you will receive your results only by: [place] (if you have MyChart) OR A paper copy in the mail If you have any lab test that is abnormal or we need to change your treatment, we will call you to review the results.   Testing/Procedures: None   Follow-Up: At [facility], you and your health needs are our priority.  As part of our continuing mission to provide you with exceptional heart care, we have created designated Provider Care Teams.  These Care Teams include your primary Cardiologist (physician) and Advanced Practice Providers (APPs -  Physician Assistants and Nurse Practitioners) who all work together to provide you with the care you need, when you need it.   Your next appointment:   1 year(s)  The format for your next appointment:   In Person  Provider:   Ida Rogue, MD

## 2020-11-17 ENCOUNTER — Other Ambulatory Visit: Payer: Self-pay | Admitting: Family Medicine

## 2020-11-17 NOTE — Telephone Encounter (Signed)
Requested medication (s) are due for refill today - yes  Requested medication (s) are on the active medication list -yes  Future visit scheduled -yes  Last refill: 08/12/20  Notes to clinic: Request RF: historical provider listed   Requested Prescriptions  Pending Prescriptions Disp Refills   TRULICITY 4.5 ER/1.5QM SOPN [Pharmacy Med Name: TRULICITY 4.5 GQ/6.7YP SUBQ SOLN ML] 6 mL     Sig: INJECT 4.5MG  ONCE WEEKLY AS DIRECTED     Endocrinology:  Diabetes - GLP-1 Receptor Agonists Passed - 11/17/2020  8:29 AM      Passed - HBA1C is between 0 and 7.9 and within 180 days    Hemoglobin A1C  Date Value Ref Range Status  01/31/2016 8.7  Final   HB A1C (BAYER DCA - WAIVED)  Date Value Ref Range Status  08/19/2020 7.9 (H) <7.0 % Final    Comment:                                          Diabetic Adult            <7.0                                       Healthy Adult        4.3 - 5.7                                                           (DCCT/NGSP) American Diabetes Association's Summary of Glycemic Recommendations for Adults with Diabetes: Hemoglobin A1c <7.0%. More stringent glycemic goals (A1c <6.0%) may further reduce complications at the cost of increased risk of hypoglycemia.           Passed - Valid encounter within last 6 months    Recent Outpatient Visits           2 weeks ago Acute cystitis with hematuria   [place], Megan P, DO   1 month ago Acute left-sided thoracic back pain   [place], Megan P, DO   3 months ago Acute cystitis without hematuria   [place], Megan P, DO   6 months ago Type 2 diabetes mellitus with stage 3 chronic kidney disease, with long-term current use of insulin, unspecified whether stage 3a or 3b CKD ([place])   [place], Megan P, DO   9 months ago Type 2 diabetes mellitus with stage 3 chronic kidney disease, with long-term current use of insulin,  unspecified whether stage 3a or 3b CKD ([place])   [place], Megan P, DO       Future Appointments             In 2 weeks Stoio[place], Ronda Fairly, MD [place]   In 1 month  [place], [facility]               Requested Prescriptions  Pending Prescriptions Disp Refills   TRULICITY 4.5 PJ/0.9TO SOPN [Pharmacy Med Name: TRULICITY 4.5 IZ/1.2WP SUBQ SOLN ML] 6 mL     Sig: INJECT 4.5MG  ONCE WEEKLY AS DIRECTED  Endocrinology:  Diabetes - GLP-1 Receptor Agonists Passed - 11/17/2020  8:29 AM      Passed - HBA1C is between 0 and 7.9 and within 180 days    Hemoglobin A1C  Date Value Ref Range Status  01/31/2016 8.7  Final   HB A1C (BAYER DCA - WAIVED)  Date Value Ref Range Status  08/19/2020 7.9 (H) <7.0 % Final    Comment:                                          Diabetic Adult            <7.0                                       Healthy Adult        4.3 - 5.7                                                           (DCCT/NGSP) American Diabetes Association's Summary of Glycemic Recommendations for Adults with Diabetes: Hemoglobin A1c <7.0%. More stringent glycemic goals (A1c <6.0%) may further reduce complications at the cost of increased risk of hypoglycemia.           Passed - Valid encounter within last 6 months    Recent Outpatient Visits           2 weeks ago Acute cystitis with hematuria   [place], Megan P, DO   1 month ago Acute left-sided thoracic back pain   [place], Megan P, DO   3 months ago Acute cystitis without hematuria   [place], Megan P, DO   6 months ago Type 2 diabetes mellitus with stage 3 chronic kidney disease, with long-term current use of insulin, unspecified whether stage 3a or 3b CKD ([place])   [place], Megan P, DO   9 months ago Type 2 diabetes mellitus with stage 3 chronic kidney disease, with  long-term current use of insulin, unspecified whether stage 3a or 3b CKD ([place])   [place], Megan P, DO       Future Appointments             In 2 weeks Stoioff, Ronda Fairly, MD [place]   In 1 month  [facility], [facility]

## 2020-11-23 ENCOUNTER — Ambulatory Visit: Payer: Medicare Other | Admitting: Family Medicine

## 2020-11-23 DIAGNOSIS — Z96651 Presence of right artificial knee joint: Secondary | ICD-10-CM | POA: Diagnosis not present

## 2020-11-24 ENCOUNTER — Other Ambulatory Visit: Payer: Self-pay | Admitting: Physician Assistant

## 2020-11-24 DIAGNOSIS — N3281 Overactive bladder: Secondary | ICD-10-CM

## 2020-11-30 ENCOUNTER — Other Ambulatory Visit: Payer: Self-pay | Admitting: Family Medicine

## 2020-11-30 DIAGNOSIS — I129 Hypertensive chronic kidney disease with stage 1 through stage 4 chronic kidney disease, or unspecified chronic kidney disease: Secondary | ICD-10-CM | POA: Diagnosis not present

## 2020-11-30 DIAGNOSIS — E875 Hyperkalemia: Secondary | ICD-10-CM | POA: Diagnosis not present

## 2020-11-30 DIAGNOSIS — N1832 Chronic kidney disease, stage 3b: Secondary | ICD-10-CM | POA: Diagnosis not present

## 2020-11-30 DIAGNOSIS — R809 Proteinuria, unspecified: Secondary | ICD-10-CM | POA: Diagnosis not present

## 2020-11-30 DIAGNOSIS — E1122 Type 2 diabetes mellitus with diabetic chronic kidney disease: Secondary | ICD-10-CM | POA: Diagnosis not present

## 2020-11-30 NOTE — Telephone Encounter (Signed)
Requested medication (s) are due for refill today: yes  Requested medication (s) are on the active medication list: yes  Last refill:  09/03/20 #90 1 refill  Future visit scheduled: yes in 2 weeks  Notes to clinic:  medication not assigned to a protocol. Do you want to refill Rx?     Requested Prescriptions  Pending Prescriptions Disp Refills   trimethoprim (TRIMPEX) 100 MG tablet [Pharmacy Med Name: TRIMETHOPRIM 100 MG TAB] 90 tablet 1    Sig: TAKE ONE TABLET BY MOUTH EVERY DAY     Off-Protocol Failed - 11/30/2020 11:08 AM      Failed - Medication not assigned to a protocol, review manually.      Passed - Valid encounter within last 12 months    Recent Outpatient Visits           1 month ago Acute cystitis with hematuria   [place], Megan P, DO   2 months ago Acute left-sided thoracic back pain   [place], Megan P, DO   3 months ago Acute cystitis without hematuria   [place], Megan P, DO   6 months ago Type 2 diabetes mellitus with stage 3 chronic kidney disease, with long-term current use of insulin, unspecified whether stage 3a or 3b CKD ([place])   [place], Megan P, DO   9 months ago Type 2 diabetes mellitus with stage 3 chronic kidney disease, with long-term current use of insulin, unspecified whether stage 3a or 3b CKD ([place])   [place], Megan P, DO       Future Appointments             In 2 weeks [place], [facility]             Signed Prescriptions Disp Refills   pantoprazole (PROTONIX) 40 MG tablet 90 tablet 1    Sig: TAKE 1 TABLET BY MOUTH ONCE DAILY     Gastroenterology: Proton Pump Inhibitors Passed - 11/30/2020 11:08 AM      Passed - Valid encounter within last 12 months    Recent Outpatient Visits           1 month ago Acute cystitis with hematuria   [place], Megan P, DO   2 months ago Acute  left-sided thoracic back pain   [place], Megan P, DO   3 months ago Acute cystitis without hematuria   [place], Megan P, DO   6 months ago Type 2 diabetes mellitus with stage 3 chronic kidney disease, with long-term current use of insulin, unspecified whether stage 3a or 3b CKD ([place])   [facility] [place], Megan P, DO   9 months ago Type 2 diabetes mellitus with stage 3 chronic kidney disease, with long-term current use of insulin, unspecified whether stage 3a or 3b CKD ([place])   [place], Megan P, DO       Future Appointments             In 2 weeks [facility], [facility]

## 2020-11-30 NOTE — Telephone Encounter (Signed)
Future visit in 2 weeks  

## 2020-12-02 ENCOUNTER — Ambulatory Visit: Payer: Self-pay | Admitting: Urology

## 2020-12-06 ENCOUNTER — Other Ambulatory Visit: Payer: Self-pay | Admitting: Family Medicine

## 2020-12-06 ENCOUNTER — Telehealth: Payer: Self-pay

## 2020-12-06 NOTE — Chronic Care Management (AMB) (Signed)
Chronic Care Management Pharmacy Assistant   Name: Tammy [name]  MRN: [id number] DOB: 18-Jun-1945  Tammy [name] is an 75 y.o. year old female who presents for his follow-up CCM visit with the clinical pharmacist.  Reason for Encounter: Disease State Diabetes   Recent office visits:   10/28/20 Park Liter DO PCP- pt seen for Acute cystitis with hematuria. Labs were ordered and pt was started on Ciprofloxacin 500 mg BID. Follow up PRN   09/30/20 Park Liter DO PCP- pt was seen for acute left side thoracic back pain. Labs were ordered and a EKG was performed. Provider started pt on Ciprofloxacin HCI 500 mg BID and Cyclobenzaprine HCI 10 mg at bedtime. Pt was given Ketorolac inj 60 mg during this visit.Follow up in 2 weeks.  Recent consult visits:  11/30/20 [facility] Nephrology- pt was seen for chronic kidney disease. Provider Discontinued losartan due to hyperkalemia. Follow up in 6 months    11/10/20 Theora Gianotti NP Cardiology- pt was seen for Coronary Artery Disease.    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 Labs were ordered and pt advised to follow up in 3 months.   10/14/20 Judith Part MD Neurosurgery and Spine- pt was seen for Cervical radiculopathy. No follow up noted.    10/05/20 [name] MD Vascular Surgery- pt was seen for Peripheral artery disease. Labs were ordered and pt advised to follow up in 1 year.  Hospital visits: .  None in previous 6 months  Medications: Outpatient Encounter Medications as of 12/06/2020  Medication Sig   anastrozole (ARIMIDEX) 1 MG tablet Take 1 tablet (1 mg total) by mouth daily.   apixaban (ELIQUIS) 2.5 MG TABS tablet Take 1 tablet (2.5 mg total) by mouth 2 (two) times daily.   blood glucose meter kit and supplies KIT Dispense based on patient and insurance preference. Use one time daily as directed, E11.9   Calcium Carb-Cholecalciferol (CALCIUM 600 + D)  600-200 MG-UNIT TABS Take 1 tablet by mouth daily.    cetirizine (ZYRTEC) 10 MG tablet TAKE 1 TABLET BY MOUTH DAILY   cholecalciferol (VITAMIN D3) 25 MCG (1000 UT) tablet Take 1,000 Units by mouth daily.   ciprofloxacin (CIPRO) 500 MG tablet Take 1 tablet (500 mg total) by mouth 2 (two) times daily. (Patient not taking: Reported on 11/10/2020)   cyanocobalamin (,VITAMIN B-12,) 1000 MCG/ML injection Inject 1,000 mcg into the muscle once a week. Then once a month x 4 months (Patient not taking: Reported on 11/10/2020)   DULoxetine (CYMBALTA) 60 MG capsule Take 2 capsules (120 mg total) by mouth daily.   empagliflozin (JARDIANCE) 25 MG TABS tablet Take 1 tablet (25 mg total) by mouth daily. TAKE ONE TABLET 25MG EVERY DAY BEFORE BREAKFAST   gabapentin (NEURONTIN) 300 MG capsule Take 1 capsule (300 mg total) by mouth 3 (three) times daily.   glipiZIDE (GLUCOTROL) 5 MG tablet TAKE ONE TABLET BY MOUTH EVERY DAY BEFORE BREAKFAST   insulin degludec (TRESIBA FLEXTOUCH) 100 UNIT/ML FlexTouch Pen Inject 24 Units into the skin daily.   Insulin Pen Needle (PEN NEEDLES 3/16") 31G X 5 MM MISC 1 each by Does not apply route daily.   isosorbide mononitrate (IMDUR) 60 MG 24 hr tablet TAKE ONE TABLET BY MOUTH AT BEDTIME AND HALF A TABLET EVERY MORNING (Patient taking differently: TAKE ONE TABLET BY MOUTH AT BEDTIME)   Lactobacillus (PROBIOTIC ACIDOPHILUS PO) Take 1 capsule  by mouth daily at 12 noon.   metoprolol succinate (TOPROL-XL) 50 MG 24 hr tablet TAKE 1 TABLET BY MOUTH DAILY WITH OR IMMEDIATLEY FOLLOWING A MEAL   montelukast (SINGULAIR) 10 MG tablet Take 1 tablet (10 mg total) by mouth at bedtime.   MYRBETRIQ 50 MG TB24 tablet TAKE 1 TABLET BY MOUTH DAILY   nitroGLYCERIN (NITROSTAT) 0.4 MG SL tablet Place 1 tablet (0.4 mg total) under the tongue every 5 (five) minutes as needed for chest pain. Total of 3 doses   pantoprazole (PROTONIX) 40 MG tablet TAKE 1 TABLET BY MOUTH ONCE DAILY   PROAIR HFA 108 (90 Base)  MCG/ACT inhaler TAKE 2 PUFFS EVERY 4 HOURS AS NEEDED FORWHEEZING OR SHORTNESS OF BREATH   Pumpkin Seed-Soy Germ (AZO BLADDER CONTROL/GO-LESS PO) Take 1 tablet by mouth in the morning and at bedtime.   rosuvastatin (CRESTOR) 40 MG tablet Take 1 tablet (40 mg total) by mouth daily.   senna (SENOKOT) 8.6 MG TABS tablet Take 8.6 mg by mouth daily as needed for mild constipation.   SYNTHROID 150 MCG tablet TAKE ONE TABLET ON AN EMPTY STOMACH WITHA GLASS OF WATER AT LEAST 30 TO 60 MINUTES BEFORE BREAKFAST   trimethoprim (TRIMPEX) 100 MG tablet TAKE ONE TABLET BY MOUTH EVERY DAY   TRULICITY 4.5 TF/5.7DU SOPN INJECT 4.5MG ONCE WEEKLY AS DIRECTED   No facility-administered encounter medications on file as of 12/06/2020.    Recent Relevant Labs: Lab Results  Component Value Date/Time   HGBA1C 7.9 (H) 08/19/2020 10:11 AM   HGBA1C 7.7 (H) 05/11/2020 03:01 PM   HGBA1C 8.7 01/31/2016 12:00 AM   MICROALBUR 150 (H) 08/19/2020 10:11 AM   MICROALBUR 150 (H) 01/02/2019 10:30 AM    Kidney Function Lab Results  Component Value Date/Time   CREATININE 1.66 (H) 11/09/2020 11:30 AM   CREATININE 1.62 (H) 11/02/2020 03:10 PM   CREATININE 1.31 (H) 09/20/2011 11:34 AM   GFRNONAA 33 (L) 10/18/2020 02:18 PM   GFRNONAA 42 (L) 09/20/2011 11:34 AM   GFRAA 32 (L) 02/10/2020 02:40 PM   GFRAA 48 (L) 09/20/2011 11:34 AM    Current antihyperglycemic regimen:  Trulicity 2.0UR inject once a week Jardiance 74m 1 tablet before breakfast Glipizide 527m1 tablet before breakfast Tresiba Inject 24 units daily  What recent interventions/DTPs have been made to improve glycemic control:  none  Have there been any recent hospitalizations or ED visits since last visit with CPP? No  Patientreports  hypoglycemic symptoms, including Vision changes  Patient reports hyperglycemic symptoms, including blurry vision  How often are you checking your blood sugar? once daily  What are your blood sugars ranging?  Fasting: over  200 because she didn't insulin last  Before meals:  After meals:at 388 (pt hadnt eaten since 830 am this was her number as we were on the call) Bedtime:   During the week, how often does your blood glucose drop below 70? Never  Are you checking your feet daily/regularly? Daily  Adherence Review: Is the patient currently on a STATIN medication? No Is the patient currently on ACE/ARB medication? No Does the patient have >5 day gap between last estimated fill dates? No  Usually her levels are around 110-140...took TrTyler Aashis a.m and Trulicity(every Tuesday) this a.m . Pt is not experiencing any sx and does not want to go to the ED.    Star Rating Drugs: rosuvastatin (CRESTOR) 40 MG tablet last filled   [place] [facility] Assistant [phone] Usually around  110-140...took [place] this a.m and Trulicity this a.m .

## 2020-12-07 ENCOUNTER — Telehealth: Payer: Self-pay

## 2020-12-08 DIAGNOSIS — H4322 Crystalline deposits in vitreous body, left eye: Secondary | ICD-10-CM | POA: Diagnosis not present

## 2020-12-08 DIAGNOSIS — H43821 Vitreomacular adhesion, right eye: Secondary | ICD-10-CM | POA: Diagnosis not present

## 2020-12-08 DIAGNOSIS — H2513 Age-related nuclear cataract, bilateral: Secondary | ICD-10-CM | POA: Diagnosis not present

## 2020-12-08 NOTE — Chronic Care Management (AMB) (Signed)
Spoke with pt and she informed me that her levels are back in normal range(120 this morning).  [place] Pharmacist Assistant [phone]

## 2020-12-13 ENCOUNTER — Ambulatory Visit
Admission: RE | Admit: 2020-12-13 | Discharge: 2020-12-13 | Disposition: A | Discharge status: Home / Self Care | Payer: Medicare Other | Source: Ambulatory Visit | Attending: Radiation Oncology | Admitting: Radiation Oncology

## 2020-12-13 ENCOUNTER — Ambulatory Visit (INDEPENDENT_AMBULATORY_CARE_PROVIDER_SITE_OTHER): Payer: Medicare Other

## 2020-12-13 ENCOUNTER — Other Ambulatory Visit: Payer: Self-pay

## 2020-12-13 DIAGNOSIS — C3490 Malignant neoplasm of unspecified part of unspecified bronchus or lung: Secondary | ICD-10-CM | POA: Diagnosis not present

## 2020-12-13 DIAGNOSIS — J439 Emphysema, unspecified: Secondary | ICD-10-CM | POA: Diagnosis not present

## 2020-12-13 DIAGNOSIS — C3412 Malignant neoplasm of upper lobe, left bronchus or lung: Secondary | ICD-10-CM

## 2020-12-13 DIAGNOSIS — Z794 Long term (current) use of insulin: Secondary | ICD-10-CM

## 2020-12-13 DIAGNOSIS — R911 Solitary pulmonary nodule: Secondary | ICD-10-CM | POA: Diagnosis not present

## 2020-12-13 DIAGNOSIS — I700 Atherosclerosis of aorta: Secondary | ICD-10-CM | POA: Diagnosis not present

## 2020-12-13 DIAGNOSIS — N183 Chronic kidney disease, stage 3 unspecified: Secondary | ICD-10-CM

## 2020-12-13 NOTE — Progress Notes (Signed)
Chronic Care Management Pharmacy Note  12/13/2020 Name:  Tammy [name] MRN:  [id number] DOB:  07-Jul-1945  Summary: Will work on trulicity patient assistance application with patient  Subjective: Tammy [name] is an 75 y.o. year old female who is a primary patient of [name], DO.  The CCM team was consulted for assistance with disease management and care coordination needs.    Engaged with patient by telephone for follow up visit in response to provider referral for pharmacy case management and/or care coordination services.   Consent to Services:  The patient was given information about Chronic Care Management services, agreed to services, and gave verbal consent prior to initiation of services.  Please see initial visit note for detailed documentation.   Patient Care Team: [name], DO as PCP - General (Family Medicine) [name], MD as PCP - Cardiology (Cardiology) [name], MD as Consulting Physician (Cardiology) [name], MD as Consulting Physician (Urology) [name], MD as Consulting Physician (Nephrology) [name], MD as Referring Physician (Orthopedic Surgery) [name], MD as Attending Physician (Endocrinology) [name], MD as Referring Physician (Vascular Surgery) [name], [facility] as Pharmacist (Pharmacist) [name], RN as Case Manager ([place])  Hospital visits: None in previous 6 months  Objective:  Lab Results  Component Value Date   CREATININE 1.66 (H) 11/09/2020   CREATININE 1.62 (H) 11/02/2020   CREATININE 1.66 (H) 10/28/2020    Lab Results  Component Value Date   HGBA1C 7.9 (H) 08/19/2020   HGBA1C 7.7 (H) 05/11/2020   HGBA1C 8.6 (H) 04/17/2020   Last diabetic Eye exam:  Lab Results  Component Value Date/Time   HMDIABEYEEXA No Retinopathy 11/09/2020 12:00 AM    Last diabetic Foot exam: No results found for: HMDIABFOOTEX      Component Value Date/Time   CHOL 152  08/19/2020 1015   CHOL 122 11/11/2019 1424   CHOL 158 07/08/2019 1545   CHOL 152 01/31/2016 1419   TRIG 165 (H) 08/19/2020 1015   TRIG 175 (H) 11/11/2019 1424   TRIG 319 (H) 07/08/2019 1545   TRIG 224 (H) 01/31/2016 1419   HDL 53 08/19/2020 1015   HDL 40 11/11/2019 1424   HDL 50 07/08/2019 1545   CHOLHDL 3.2 09/07/2015 0805   CHOLHDL 3.4 12/17/2014 1017   VLDL 45 (H) 01/31/2016 1419   [place] 71 08/19/2020 1015   LDLCALC 53 11/11/2019 1424   [place] 59 07/08/2019 1545    Hepatic Function Latest Ref Rng & Units 10/28/2020 09/30/2020 08/19/2020  Total Protein 6.0 - 8.5 g/dL 7.2 6.7 6.8  Albumin 3.7 - 4.7 g/dL 4.5 4.3 4.3  AST 0 - 40 IU/L 14 13 17   ALT 0 - 32 IU/L 10 10 11   Alk Phosphatase 44 - 121 IU/L 92 104 113  Total Bilirubin 0.0 - 1.2 mg/dL 0.4 0.3 0.4  Bilirubin, Direct 0.0 - 0.2 mg/dL - - -    Lab Results  Component Value Date/Time   TSH 0.865 11/11/2019 02:24 PM   TSH 4.680 (H) 05/20/2019 02:07 PM    CBC Latest Ref Rng & Units 11/02/2020 10/28/2020 10/18/2020  WBC 3.4 - 10.8 x10E3/uL 8.0 11.6(H) 7.6  Hemoglobin 11.1 - 15.9 g/dL 12.6 13.8 12.8  Hematocrit 34.0 - 46.6 % 39.4 44.1 41.4  Platelets 150 - 450 x10E3/uL 274 311 277    Lab Results  Component Value Date/Time   VD25OH 32.6 04/08/2019 04:38 PM    Clinical  ASCVD:  The 10-year ASCVD risk score (Arnett DK, et al., 2019) is: 32.8%   Values used to calculate the score:     Age: 68 years     Sex: Female     Is Non-Hispanic African American: No     Diabetic: Yes     Tobacco smoker: No     Systolic Blood Pressure: 641 mmHg     Is BP treated: Yes     HDL Cholesterol: 53 mg/dL     Total Cholesterol: 152 mg/dL   Social History   Tobacco Use  Smoking Status Former   Packs/day: 0.50   Years: 15.00   Pack years: 7.50   Types: Cigarettes   Quit date: 03/14/2007   Years since quitting: 13.7  Smokeless Tobacco Never   BP Readings from Last 3 Encounters:  11/10/20 125/70  10/28/20 133/82  10/18/20 121/64    Pulse Readings from Last 3 Encounters:  11/10/20 81  10/28/20 78  10/18/20 73   Wt Readings from Last 3 Encounters:  11/10/20 214 lb 12.8 oz (97.4 kg)  10/28/20 214 lb (97.1 kg)  10/18/20 215 lb (97.5 kg)   BMI Readings from Last 3 Encounters:  11/10/20 38.05 kg/m  10/28/20 37.92 kg/m  10/18/20 34.70 kg/m    CAT ASSESSMENT  Rank each of the following items on a scale of 0 to 5 (with 5 being most severe) Write a # 0-5 in each box  I never cough (0) > I cough all the time (5) 0  I have no phlegm (mucus) in my chest (0) > My chest is completely full of phlegm (mucus) (5) 0  My chest does not feel tight at all (0) > My chest feels very tight (5) 0  When I walk up a hill or one flight of stairs I am not breathless (0) > When I walk up a hill or one flight of stairs I am very breathless (5) 5  I am not limited doing any activities at home (0) > I am very limited doing activities at home (5) 0  I am confident leaving my home despite my lung function (0) > I am not at all confident leaving my home because of my lung condition (5)  0  I sleep soundly (0) > I don't sleep soundly because of my lung condition (5) 0  I have lots of energy (0) > I have no energy at all (5) 4   Total CAT Score: 9 Assessment: Review of patient past medical history, allergies, medications, health status, including review of consultants reports, laboratory and other test data, was performed as part of comprehensive evaluation and provision of chronic care management services.   SDOH:  (Social Determinants of Health) assessments and interventions performed: Yes   CCM Care Plan  Allergies  Allergen Reactions   Hydrocodone Other (See Comments)    Unresponsive   Aleve [Naproxen Sodium]     Hives & itching    Contrast Media [Iodinated Diagnostic Agents]     Pt avoids due to kidney issues   Ioxaglate Other (See Comments)    (iodine) Pt avoids due to kidney issues    Oxycodone Other (See Comments)     hallucinations   Ranexa [Ranolazine]     Sick on stomach   Sulfa Antibiotics Other (See Comments)    Pt avoids due to kidney issues   Tramadol Other (See Comments)    nightmares    Medications Reviewed Today     Reviewed  by Madelin Rear, [facility] (Pharmacist) on 12/13/20 at [place] List Status: <None>   Medication Order Taking? Sig Documenting Provider Last Dose Status Informant  anastrozole (ARIMIDEX) 1 MG tablet [id number] No Take 1 tablet (1 mg total) by mouth daily. Cammie Sickle, MD Taking Active   apixaban (ELIQUIS) 2.5 MG TABS tablet [id number] No Take 1 tablet (2.5 mg total) by mouth 2 (two) times daily. Park Liter P, DO Taking Active   blood glucose meter kit and supplies KIT [id number] No Dispense based on patient and insurance preference. Use one time daily as directed, E11.9 Lada, Satira Anis, MD Taking Active Other  Calcium Carb-Cholecalciferol (CALCIUM 600 + D) 600-200 MG-UNIT TABS [id number] No Take 1 tablet by mouth daily.  [provider] Taking Active Other  cetirizine (ZYRTEC) 10 MG tablet [id number] No TAKE 1 TABLET BY MOUTH DAILY Johnson, Megan P, DO Taking Active   cholecalciferol (VITAMIN D3) 25 MCG (1000 UT) tablet [id number] No Take 1,000 Units by mouth daily. [provider] Taking Active Other  ciprofloxacin (CIPRO) 500 MG tablet [id number] No Take 1 tablet (500 mg total) by mouth 2 (two) times daily.  Patient not taking: Reported on 11/10/2020   [name], DO Not Taking Active   cyanocobalamin (,VITAMIN B-12,) 1000 MCG/ML injection [id number] No Inject 1,000 mcg into the muscle once a week. Then once a month x 4 months  Patient not taking: Reported on 11/10/2020   [provider] Not Taking Active   DULoxetine (CYMBALTA) 60 MG capsule [id number] No Take 2 capsules (120 mg total) by mouth daily. Johnson, Megan P, DO Taking Active   empagliflozin (JARDIANCE) 25 MG TABS tablet [id number] No Take 1 tablet (25 mg total) by mouth daily.  TAKE ONE TABLET 25MG EVERY DAY BEFORE BREAKFAST Johnson, Megan P, DO Taking Active   gabapentin (NEURONTIN) 300 MG capsule [id number] No Take 1 capsule (300 mg total) by mouth 3 (three) times daily. Johnson, Megan P, DO Taking Active   glipiZIDE (GLUCOTROL) 5 MG tablet [id number] No TAKE ONE TABLET BY MOUTH EVERY DAY BEFORE BREAKFAST Johnson, Megan P, DO Taking Active   insulin degludec (TRESIBA FLEXTOUCH) 100 UNIT/ML FlexTouch Pen [id number] No Inject 24 Units into the skin daily. [provider] Taking Active   Insulin Pen Needle (PEN NEEDLES 3/16") 31G X 5 MM MISC [id number] No 1 each by Does not apply route daily. Park Liter P, DO Taking Active Other  isosorbide mononitrate (IMDUR) 60 MG 24 hr tablet [id number] No TAKE ONE TABLET BY MOUTH AT BEDTIME AND HALF A TABLET EVERY MORNING  Patient taking differently: TAKE ONE TABLET BY MOUTH AT BEDTIME   Wynetta Emery, Megan P, DO Taking Active   Lactobacillus (PROBIOTIC ACIDOPHILUS PO) [id number] No Take 1 capsule by mouth daily at 12 noon. [provider] Taking Active   metoprolol succinate (TOPROL-XL) 50 MG 24 hr tablet [id number] No TAKE 1 TABLET BY MOUTH DAILY WITH OR IMMEDIATLEY FOLLOWING A MEAL Johnson, Megan P, DO Taking Active   montelukast (SINGULAIR) 10 MG tablet [id number] No Take 1 tablet (10 mg total) by mouth at bedtime. [name], DO Taking Active   MYRBETRIQ 50 MG TB24 tablet [id number]  TAKE 1 TABLET BY MOUTH DAILY Vaillancourt, Samantha, PA-C  Active   nitroGLYCERIN (NITROSTAT) 0.4 MG SL tablet [id number]  Place 1 tablet (0.4 mg total) under the tongue every 5 (five) minutes as needed for chest pain. Total of 3 doses Theora Gianotti, NP  Active   pantoprazole ([place])  40 MG tablet [id number]  TAKE 1 TABLET BY MOUTH ONCE DAILY [name], DO  Active   PROAIR HFA 108 [id number] Base) MCG/ACT inhaler [id number] No TAKE 2 PUFFS EVERY 4 HOURS AS NEEDED FORWHEEZING OR SHORTNESS OF BREATH Johnson, Megan P, DO Taking  Active   Pumpkin Seed-Soy Germ (AZO BLADDER CONTROL/GO-LESS PO) [id number] No Take 1 tablet by mouth in the morning and at bedtime. [provider] Taking Active   rosuvastatin (CRESTOR) 40 MG tablet [id number] No Take 1 tablet (40 mg total) by mouth daily. Johnson, Megan P, DO Taking Active   senna (SENOKOT) 8.6 MG TABS tablet [id number] No Take 8.6 mg by mouth daily as needed for mild constipation. [provider] Taking Active Other  SYNTHROID 150 MCG tablet [id number]  TAKE ONE TABLET ON AN EMPTY STOMACH WITHA GLASS OF WATER AT LEAST 30 TO 60 MINUTES BEFORE BREAKFAST Johnson, Megan P, DO  Active   trimethoprim (TRIMPEX) 100 MG tablet [id number] No TAKE ONE TABLET BY MOUTH EVERY DAY [name], DO Taking Active   TRULICITY 4.5 KD/9.8PJ SOPN [id number]  INJECT 4.5MG ONCE WEEKLY AS DIRECTED [name], DO  Active             Patient Active Problem List   Diagnosis Date Noted   Lung nodule 08/19/2020   Fall 04/17/2020   Anemia 02/16/2020   Asthma 02/16/2020   History of cardiac catheterization 02/16/2020   History of left mastectomy 02/16/2020   Malignant tumor of breast ([place]) 02/16/2020   History of total knee arthroplasty 11/18/2019   Unstable angina ([place]) 04/03/2019   Dysphagia    Thickening of esophagus    Stomach irritation    Gastric polyp    Positive occult stool blood test    Polyp of colon    Hyperkalemia 11/27/2018   Proteinuria 11/27/2018   Type 2 diabetes mellitus with stage 3 chronic kidney disease ([place]) 11/27/2018   Carcinoma of lower-inner quadrant of left breast in female, estrogen receptor positive ([place]) 10/02/2018   Right knee pain 08/01/2017   Chronic right-sided low back pain with right-sided sciatica 05/28/2017   Gait abnormality 05/28/2017   Hypothyroid 07/18/2016   COPD (chronic obstructive pulmonary disease) ([place]) 07/18/2016   Encephalomalacia on imaging study 12/10/2015   Calcification of both carotid arteries 12/10/2015    Benign neoplasm of sigmoid colon    Benign neoplasm of cecum    Benign neoplasm of ascending colon    Bilateral atelectasis 04/11/2015   Fatty liver disease, nonalcoholic 73/41/9379   Hydronephrosis of right kidney 04/11/2015   Abdominal aortic ectasia ([place]) 04/11/2015   Constipation 04/07/2015   Abdominal aortic atherosclerosis ([place]) 12/18/2014   Diverticulosis of colon 12/18/2014   Rhinitis, allergic 10/20/2014   Coronary artery disease    Essential hypertension    Morbid obesity due to excess calories ([place])    Spinal stenosis of lumbar region 06/06/2014   Herniated nucleus pulposus 06/06/2014   Neuralgia neuritis, sciatic nerve 05/24/2014   Pulmonary embolism ([place]) 01/30/2014   Benign hypertensive renal disease 12/16/2013   Uncontrolled type 2 diabetes mellitus with hyperglycemia, with long-term current use of insulin ([place]) 12/16/2013   Chronic kidney disease 12/16/2013   Frequent UTI 11/03/2013   Overactive bladder 11/03/2013   Coronary atherosclerosis of native coronary artery 06/19/2013   History of back surgery 06/19/2013   Hyperlipidemia 06/19/2013   PAD (peripheral artery disease) ([place]) 06/19/2013   Congenital obstruction of ureteropelvic junction 11/21/2011    Immunization History  Administered  Date(s) Administered   Fluad Quad(high Dose 65+) 01/02/2019, 02/10/2020   Influenza, High Dose Seasonal PF 11/16/2016, 12/18/2017   Influenza,inj,Quad PF,6+ Mos 12/16/2014, 11/24/2015   PFIZER(Purple Top)SARS-COV-2 Vaccination 05/08/2019, 05/29/2019   Pneumococcal Conjugate-13 07/08/2014   Pneumococcal Polysaccharide-23 03/13/2012   Zoster, Live 03/13/2010    Conditions to be addressed/monitored: T2DM, chronic pain, frequent UTIs, COPD, HTN  Care Plan : [place] plan  Updates made by Madelin Rear, [facility] since 12/13/2020 12:00 AM     Problem: DM, CKD,  HTN, HD, hypothyroidism, H/o PE/DVT, overactive bladder, CAD, PVD      Long-Range Goal: disease management    Start Date: 12/13/2020  Expected End Date: 12/13/2021  Recent Progress: On track  Priority: High  Note:    Current Barriers:  Unable to independently afford treatment regimen Unable to independently monitor therapeutic efficacy  Pharmacist Clinical Goal(s):  Patient will pursue eliquis patient sistance through collaboration with PharmD and provider.   Interventions: 1:1 collaboration with [name], DO regarding development and update of comprehensive plan of care as evidenced by provider attestation and co-signature Inter-disciplinary care team collaboration (see longitudinal plan of care) Comprehensive medication review performed; medication list updated in electronic medical record  Hyperlipidemia: (LDL goal < 70) -Not ideally controlled CVA hx, PVD -Was also on ASA before eliquis - developed recurring dose bleeds -Current treatment: Rosuvstatin 40 mg once daily -Side effect review - no problems noted -Educated on Cholesterol goals;  Benefits of statin for ASCVD risk reduction; -Recommended to continue current medication  Diabetes (A1c goal <8%) -Not ideally controlled -Current medications: Trulicity 4.5 mg once weekly Tresiba 24 units daily Glipizide 5 mg once daily Jardiance 25 mg daily  -Current home glucose readings fasting glucose: typically 110-150, reports previous missed dose of tresiba leading to reading in upper 300s was very rare.  -Denies hypoglycemic/hyperglycemic symptoms -Current meal patterns: tries to be consistent with meals, avoids sweets -Current exercise: limited due to back pain -Educated on A1c and blood sugar goals; -Counseled to check feet daily and get yearly eye exams -Recommended to continue current medication  COPD (Goal: control symptoms and prevent exacerbations) -Controlled -Current treatment  Proair as needed -Gold Grade: Gold 2 (FEV1 50-79%) -Current COPD Classification:  A (low sx, <2 exacerbations/yr) -MMRC/CAT score:  9 -Pulmonary function testing: 2021 -Exacerbations requiring treatment in last 6 months: 0 -Patient denies consistent use of maintenance inhaler -Frequency of rescue inhaler use: 1-3x/wk -Counseled on Proper inhaler technique; When to use rescue inhaler -Recommended to continue current medication Will let us know of worsening symptoms/if needing to use rescue inhaler >2x/wk  Patient Goals/Self-Care Activities Patient will:  - take medications as prescribed  Medication Assistance: Application for trulicity  medication assistance program. in process.  Anticipated assistance start date 01/2021.  See plan of care for additional detail.  Patient's preferred pharmacy is:  [facility] - [place], [place] - [address] [place] [address] Phone: [phone] Fax: [phone]      Pt endorses 100% compliance  Follow Up:  Patient agrees to Care Plan and Follow-up. Plan: HC 1 month f/u on pap completion and DM. Pharmacist 3 month f/u   Future Appointments  Date Time Provider [place]  12/20/2020  2:30 PM [facility] NURSE HEALTH ADVISOR CFP-CFP [facility]  12/22/2020  2:00 PM [name], MD CCAR-RADONC None  12/28/2020  9:45 AM CFP CCM CASE MANAGER CFP-CFP PEC  01/17/2021  2:30 PM CCAR-MO LAB CCAR-MEDONC None  01/17/2021  3:00 PM Cammie Sickle, MD CCAR-MEDONC None  03/21/2021 11:00 AM CFP CCM PHARMACY CFP-CFP PEC  10/04/2021  2:00 PM AVVS VASC 3 AVVS-IMG None  10/04/2021  3:00 PM Dew, Erskine Squibb, MD AVVS-AVVS None   Madelin Rear, PharmD, BCGP Clinical Pharmacist  [phone]

## 2020-12-16 ENCOUNTER — Telehealth: Payer: Self-pay

## 2020-12-16 NOTE — Chronic Care Management (AMB) (Signed)
Chronic Care Management Pharmacy Assistant   Name: CORIANNA AVALLONE  MRN: [id number] DOB: 04/13/1945  Reason for Encounter: Patient Assistance Application for Trulicity 4.5 mg    Medications: Outpatient Encounter Medications as of 12/16/2020  Medication Sig   anastrozole (ARIMIDEX) 1 MG tablet Take 1 tablet (1 mg total) by mouth daily.   apixaban (ELIQUIS) 2.5 MG TABS tablet Take 1 tablet (2.5 mg total) by mouth 2 (two) times daily.   blood glucose meter kit and supplies KIT Dispense based on patient and insurance preference. Use one time daily as directed, E11.9   Calcium Carb-Cholecalciferol (CALCIUM 600 + D) 600-200 MG-UNIT TABS Take 1 tablet by mouth daily.    cetirizine (ZYRTEC) 10 MG tablet TAKE 1 TABLET BY MOUTH DAILY   cholecalciferol (VITAMIN D3) 25 MCG (1000 UT) tablet Take 1,000 Units by mouth daily.   ciprofloxacin (CIPRO) 500 MG tablet Take 1 tablet (500 mg total) by mouth 2 (two) times daily. (Patient not taking: Reported on 11/10/2020)   cyanocobalamin (,VITAMIN B-12,) 1000 MCG/ML injection Inject 1,000 mcg into the muscle once a week. Then once a month x 4 months (Patient not taking: Reported on 11/10/2020)   DULoxetine (CYMBALTA) 60 MG capsule Take 2 capsules (120 mg total) by mouth daily.   empagliflozin (JARDIANCE) 25 MG TABS tablet Take 1 tablet (25 mg total) by mouth daily. TAKE ONE TABLET 25MG EVERY DAY BEFORE BREAKFAST   gabapentin (NEURONTIN) 300 MG capsule Take 1 capsule (300 mg total) by mouth 3 (three) times daily.   glipiZIDE (GLUCOTROL) 5 MG tablet TAKE ONE TABLET BY MOUTH EVERY DAY BEFORE BREAKFAST   insulin degludec (TRESIBA FLEXTOUCH) 100 UNIT/ML FlexTouch Pen Inject 24 Units into the skin daily.   Insulin Pen Needle (PEN NEEDLES 3/16") 31G X 5 MM MISC 1 each by Does not apply route daily.   isosorbide mononitrate (IMDUR) 60 MG 24 hr tablet TAKE ONE TABLET BY MOUTH AT BEDTIME AND HALF A TABLET EVERY MORNING (Patient taking differently: TAKE ONE TABLET BY MOUTH AT  BEDTIME)   Lactobacillus (PROBIOTIC ACIDOPHILUS PO) Take 1 capsule by mouth daily at 12 noon.   metoprolol succinate (TOPROL-XL) 50 MG 24 hr tablet TAKE 1 TABLET BY MOUTH DAILY WITH OR IMMEDIATLEY FOLLOWING A MEAL   montelukast (SINGULAIR) 10 MG tablet Take 1 tablet (10 mg total) by mouth at bedtime.   MYRBETRIQ 50 MG TB24 tablet TAKE 1 TABLET BY MOUTH DAILY   nitroGLYCERIN (NITROSTAT) 0.4 MG SL tablet Place 1 tablet (0.4 mg total) under the tongue every 5 (five) minutes as needed for chest pain. Total of 3 doses   pantoprazole (PROTONIX) 40 MG tablet TAKE 1 TABLET BY MOUTH ONCE DAILY   PROAIR HFA 108 (90 Base) MCG/ACT inhaler TAKE 2 PUFFS EVERY 4 HOURS AS NEEDED FORWHEEZING OR SHORTNESS OF BREATH   Pumpkin Seed-Soy Germ (AZO BLADDER CONTROL/GO-LESS PO) Take 1 tablet by mouth in the morning and at bedtime.   rosuvastatin (CRESTOR) 40 MG tablet Take 1 tablet (40 mg total) by mouth daily.   senna (SENOKOT) 8.6 MG TABS tablet Take 8.6 mg by mouth daily as needed for mild constipation.   SYNTHROID 150 MCG tablet TAKE ONE TABLET ON AN EMPTY STOMACH WITHA GLASS OF WATER AT LEAST 30 TO 60 MINUTES BEFORE BREAKFAST   trimethoprim (TRIMPEX) 100 MG tablet TAKE ONE TABLET BY MOUTH EVERY DAY   TRULICITY 4.5 QZ/3.0QT SOPN INJECT 4.5MG ONCE WEEKLY AS DIRECTED   No facility-administered encounter medications on file as of  12/16/2020.   I have prefilled and mailed out the Patient assistance application of [employer] for Trulicity 4.5 mg. I have contacted the patient to let her know to sign and date the areas of the form that is needed and once it has been completed, she is aware to bring the form back to [facility] to further the application process.  Tammy [name], [place]

## 2020-12-20 ENCOUNTER — Ambulatory Visit (INDEPENDENT_AMBULATORY_CARE_PROVIDER_SITE_OTHER): Payer: Medicare Other

## 2020-12-20 VITALS — Ht 64.0 in | Wt 217.0 lb

## 2020-12-20 DIAGNOSIS — Z0000 Encounter for general adult medical examination without abnormal findings: Secondary | ICD-10-CM | POA: Diagnosis not present

## 2020-12-20 NOTE — Progress Notes (Signed)
I connected with Tammy [name] today by telephone and verified that I am speaking with the correct person using two identifiers. Location patient: home Location provider: work Persons participating in the virtual visit: Karsten, Vaughn LPN.   I discussed the limitations, risks, security and privacy concerns of performing an evaluation and management service by telephone and the availability of in person appointments. I also discussed with the patient that there may be a patient responsible charge related to this service. The patient expressed understanding and verbally consented to this telephonic visit.    Interactive audio and video telecommunications were attempted between this provider and patient, however failed, due to patient having technical difficulties OR patient did not have access to video capability.  We continued and completed visit with audio only.     Vital signs may be patient reported or missing.  Subjective:   Tammy [name] is a 75 y.o. female who presents for Medicare Annual (Subsequent) preventive examination.  Review of Systems     Cardiac Risk Factors include: advanced age (>71mn, >>102women);diabetes mellitus;hypertension;obesity (BMI >30kg/m2);sedentary lifestyle     Objective:    Today's Vitals   12/20/20 1426  Weight: 217 lb (98.4 kg)  Height: _0  (1.626 m)   Body mass index is 37.25 kg/m.  Advanced Directives 12/20/2020 09/20/2020 07/19/2020 06/24/2020 04/17/2020 04/17/2020 12/19/2019  Does Patient Have a Medical Advance Directive? _1  No No  Would patient like information on creating a medical advance directive? - No - Patient declined No - Patient declined No - Patient declined Yes (Inpatient - patient requests chaplain consult to create a medical advance directive) No - Patient declined -    Current Medications (verified) Outpatient Encounter Medications as of 12/20/2020  Medication Sig   anastrozole (ARIMIDEX) 1 MG tablet Take 1 tablet  (1 mg total) by mouth daily.   apixaban (ELIQUIS) 2.5 MG TABS tablet Take 1 tablet (2.5 mg total) by mouth 2 (two) times daily.   blood glucose meter kit and supplies KIT Dispense based on patient and insurance preference. Use one time daily as directed, E11.9   Calcium Carb-Cholecalciferol (CALCIUM 600 + D) 600-200 MG-UNIT TABS Take 1 tablet by mouth daily.    cetirizine (ZYRTEC) 10 MG tablet TAKE 1 TABLET BY MOUTH DAILY   cholecalciferol (VITAMIN D3) 25 MCG (1000 UT) tablet Take 1,000 Units by mouth daily.   DULoxetine (CYMBALTA) 60 MG capsule Take 2 capsules (120 mg total) by mouth daily.   empagliflozin (JARDIANCE) 25 MG TABS tablet Take 1 tablet (25 mg total) by mouth daily. TAKE ONE TABLET 25MG EVERY DAY BEFORE BREAKFAST   gabapentin (NEURONTIN) 300 MG capsule Take 1 capsule (300 mg total) by mouth 3 (three) times daily.   glipiZIDE (GLUCOTROL) 5 MG tablet TAKE ONE TABLET BY MOUTH EVERY DAY BEFORE BREAKFAST   insulin degludec (TRESIBA FLEXTOUCH) 100 UNIT/ML FlexTouch Pen Inject 24 Units into the skin daily.   Insulin Pen Needle (PEN NEEDLES 3/16") 31G X 5 MM MISC 1 each by Does not apply route daily.   isosorbide mononitrate (IMDUR) 60 MG 24 hr tablet TAKE ONE TABLET BY MOUTH AT BEDTIME AND HALF A TABLET EVERY MORNING (Patient taking differently: TAKE ONE TABLET BY MOUTH AT BEDTIME)   Lactobacillus (PROBIOTIC ACIDOPHILUS PO) Take 1 capsule by mouth daily at 12 noon.   metoprolol succinate (TOPROL-XL) 50 MG 24 hr tablet TAKE 1 TABLET BY MOUTH DAILY WITH OR IMMEDIATLEY FOLLOWING A MEAL   montelukast (SINGULAIR) 10  MG tablet Take 1 tablet (10 mg total) by mouth at bedtime.   MYRBETRIQ 50 MG TB24 tablet TAKE 1 TABLET BY MOUTH DAILY   nitroGLYCERIN (NITROSTAT) 0.4 MG SL tablet Place 1 tablet (0.4 mg total) under the tongue every 5 (five) minutes as needed for chest pain. Total of 3 doses   pantoprazole (PROTONIX) 40 MG tablet TAKE 1 TABLET BY MOUTH ONCE DAILY   PROAIR HFA 108 (90 Base) MCG/ACT  inhaler TAKE 2 PUFFS EVERY 4 HOURS AS NEEDED FORWHEEZING OR SHORTNESS OF BREATH   Pumpkin Seed-Soy Germ (AZO BLADDER CONTROL/GO-LESS PO) Take 1 tablet by mouth in the morning and at bedtime.   rosuvastatin (CRESTOR) 40 MG tablet Take 1 tablet (40 mg total) by mouth daily.   senna (SENOKOT) 8.6 MG TABS tablet Take 8.6 mg by mouth daily as needed for mild constipation.   SYNTHROID 150 MCG tablet TAKE ONE TABLET ON AN EMPTY STOMACH WITHA GLASS OF WATER AT LEAST 30 TO 60 MINUTES BEFORE BREAKFAST   trimethoprim (TRIMPEX) 100 MG tablet TAKE ONE TABLET BY MOUTH EVERY DAY   TRULICITY 4.5 BD/5.3GD SOPN INJECT 4.5MG ONCE WEEKLY AS DIRECTED   ciprofloxacin (CIPRO) 500 MG tablet Take 1 tablet (500 mg total) by mouth 2 (two) times daily. (Patient not taking: No sig reported)   cyanocobalamin (,VITAMIN B-12,) 1000 MCG/ML injection Inject 1,000 mcg into the muscle once a week. Then once a month x 4 months (Patient not taking: No sig reported)   No facility-administered encounter medications on file as of 12/20/2020.    Allergies (verified) Hydrocodone, Aleve [naproxen sodium], Contrast media [iodinated diagnostic agents], Ioxaglate, Oxycodone, Ranexa [ranolazine], Sulfa antibiotics, and Tramadol   History: Past Medical History:  Diagnosis Date   Asthma    Benign neoplasm of colon    Breast cancer ([place]) 08/2018   left breast   Cervical disc disease    "bulging" - no limitations per pt   Chronic diastolic CHF (congestive heart failure) ([place])    a. 07/2012 Echo: Nl EF. mild inferoseptal HK, Gr 2 DD, mild conc LVH, mild PR/TR, mild PAH; b. 08/2019 Echo: EF 55-60%, no rwma, Gr1 DD. Nl RV size/fxn.   CKD (chronic kidney disease), stage III (HCC)    COPD (chronic obstructive pulmonary disease) (HCC)    Coronary artery disease    a. 11/2011 Cath: LAD 19md, LCX min irregs, RCA 70p, 1061mith L->R collats, EF 60%-->Med Rx; b. 03/2019 Cath: LM nl, LAD 40-5011mCX large, mild diff dzs. OM1/2 nl. RCA known to be  100m34m->R collats from LAD & LCX/OM.   Diabetes mellitus without complication (HCC)    Fusion of lumbar spine 12/22/2015   GERD (gastroesophageal reflux disease)    History of DVT (deep vein thrombosis)    History of tobacco abuse    a. Quit 2011.   Hyperlipidemia    Hypertension    Hypothyroidism    Lung cancer (HCC)[place] Obesity    Palpitations    Personal history of radiation therapy    PONV (postoperative nausea and vomiting)    Pulmonary embolus (HCC)[place]/15/2015   RLL. Presented with SOB and elevated d-dimer.    PVD (peripheral vascular disease) (HCC)[place] a. PTA of right leg and left femoral artery with stenosis; b. 09/2020 ABI: R 0.93, L 0.88 - unchanged from prior study.   [facility]   TIAs - no deficits   Past Surgical History:  Procedure Laterality Date   ANGIOPLASTY /  STENTING FEMORAL     PVD; angioplasty right leg and left femoral artery with stenosis.    APPENDECTOMY  12/2014   BACK SURGERY  03/2012   nerve stimulator inserted   BACK SURGERY     screws, rods replaced with new hardware.   BACK SURGERY  11/2016   BREAST LUMPECTOMY Left 08/26/2018   BREAST SURGERY     CARDIAC CATHETERIZATION  12/07/2011   Mid LAD 50%, distal LAD 50%, mid RCA 70%, distal RCA 100%    CARDIAC CATHETERIZATION  01/04/2011   100% occluded mid RCA with good collaterals from distal LAD, normal LVEF.   CHOLECYSTECTOMY     COLONOSCOPY  2013   COLONOSCOPY WITH PROPOFOL N/A 05/24/2015   Procedure: COLONOSCOPY WITH PROPOFOL;  Surgeon: [name], MD;  Location: [place];  Service: Endoscopy;  Laterality: N/A;  Diabetic - oral meds   COLONOSCOPY WITH PROPOFOL N/A 01/28/2019   Procedure: COLONOSCOPY WITH PROPOFOL;  Surgeon: [name], MD;  Location: [facility];  Service: Endoscopy;  Laterality: N/A;   DIAGNOSTIC LAPAROSCOPY     ESOPHAGOGASTRODUODENOSCOPY (EGD) WITH PROPOFOL N/A 01/28/2019   Procedure: ESOPHAGOGASTRODUODENOSCOPY (EGD) WITH PROPOFOL;  Surgeon:  [name], MD;  Location: [facility];  Service: Endoscopy;  Laterality: N/A;   LAPAROSCOPIC APPENDECTOMY N/A 01/06/2015   Procedure: APPENDECTOMY LAPAROSCOPIC;  Surgeon: [name], MD;  Location: [facility];  Service: General;  Laterality: N/A;   LEFT HEART CATH AND CORONARY ANGIOGRAPHY Left 04/04/2019   Procedure: LEFT HEART CATH AND CORONARY ANGIOGRAPHY;  Surgeon: [name], MD;  Location: [place] [facility];  Service: Cardiovascular;  Laterality: Left;   MASTECTOMY W/ SENTINEL NODE BIOPSY Left 08/26/2018   Procedure: PARTIAL MASTECTOMY WIDE EXCISION WITH SENTINEL LYMPH NODE BIOPSY LEFT;  Surgeon: [name], MD;  Location: [facility];  Service: General;  Laterality: Left;   POLYPECTOMY  05/24/2015   Procedure: POLYPECTOMY;  Surgeon: [name], MD;  Location: [place];  Service: Endoscopy;;   REPLACEMENT TOTAL KNEE Right    THYROIDECTOMY     TOTAL KNEE ARTHROPLASTY Right 11/13/2019   TRIGGER FINGER RELEASE     Family History  Problem Relation Age of Onset   Hypertension Father    Heart disease Father    Heart attack Father    Diabetes Father    Cancer Mother        colon   Heart attack Brother    Diabetes Brother    Hypertension Brother    Heart disease Brother    Heart attack Brother    Diabetes Brother    Hypertension Brother    Heart disease Brother    Heart attack Brother    Diabetes Brother    Hypertension Brother    Heart disease Brother    Cancer Sister        lung   COPD Sister    COPD Sister    Breast cancer Neg Hx    Social History   Socioeconomic History   Marital status: Married    Spouse name: Not on file   Number of children: 2   Years of education: some college, Audiological scientist for interior design    Highest education level: Associate degree: occupational, Hotel manager, or vocational program  Occupational History   Occupation: Retired  Tobacco Use   Smoking status: Former    Packs/day: 0.50     Years: 15.00    Pack years: 7.50    Types: Cigarettes    Quit date:  03/14/2007    Years since quitting: 13.7   Smokeless tobacco: Never  Vaping Use   Vaping Use: Never used  Substance and Sexual Activity   Alcohol use: No    Alcohol/week: 0.0 standard drinks   Drug use: Never   Sexual activity: Not Currently    Birth control/protection: Post-menopausal  Other Topics Concern   Not on file  Social History Narrative   Lives at home with husband in [place]. Quit smoking 10 years ago; never alcohol. 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    Social Determinants of Health   Financial Resource Strain: Low Risk    Difficulty of Paying Living Expenses: Not hard at all  Food Insecurity: No Food Insecurity   Worried About Charity fundraiser in the Last Year: Never true   [place] in the Last Year: Never true  Transportation Needs: No Transportation Needs   Lack of Transportation (Medical): No   Lack of Transportation (Non-Medical): No  Physical Activity: Inactive   Days of Exercise per Week: 0 days   Minutes of Exercise per Session: 0 min  Stress: No Stress Concern Present   Feeling of Stress : Not at all  Social Connections: Not on file    Tobacco Counseling Counseling given: Not Answered   Clinical Intake:  Pre-visit preparation completed: Yes  Pain : No/denies pain     Nutritional Status: BMI > 30  Obese Nutritional Risks: Nausea/ vomitting/ diarrhea (nausea on Friday and Saturday) Diabetes: Yes  How often do you need to have someone help you when you read instructions, pamphlets, or other written materials from your doctor or pharmacy?: 1 - Never What is the last grade level you completed in school?: 12th grade  Diabetic? Yes Nutrition Risk Assessment:  Has the patient had any N/V/D within the last 2 months?  Yes  Does the patient have any non-healing wounds?  No  Has the patient had any unintentional weight loss or  weight gain?  No   Diabetes:  Is the patient diabetic?  Yes  If diabetic, was a CBG obtained today?  No  Did the patient bring in their glucometer from home?  No  How often do you monitor your CBG's? daily.   Financial Strains and Diabetes Management:  Are you having any financial strains with the device, your supplies or your medication? No .  Does the patient want to be seen by Chronic Care Management for management of their diabetes?  No  Would the patient like to be referred to a Nutritionist or for Diabetic Management?  No   Diabetic Exams:  Diabetic Eye Exam: Completed 11/09/2020 Diabetic Foot Exam: Completed 08/19/2020   Interpreter Needed?: No  Information entered by :: NAllen LPN   Activities of Daily Living In your present state of health, do you have any difficulty performing the following activities: 12/20/2020 04/17/2020  Hearing? N N  Vision? Y N  Comment has some blurriness and floaters -  Difficulty concentrating or making decisions? N N  Walking or climbing stairs? Y N  Dressing or bathing? N N  Doing errands, shopping? N Y  Conservation officer, nature and eating ? N -  Using the Toilet? N -  In the past six months, have you accidently leaked urine? Y -  Do you have problems with loss of bowel control? N -  Managing your Medications? N -  Managing your Finances? N -  Housekeeping  or managing your Housekeeping? N -  Some recent data might be hidden    Patient Care Team: [name], DO as PCP - General (Family Medicine) [name] [name], MD as PCP - Cardiology (Cardiology) [name], MD as Consulting Physician (Cardiology) [name], MD as Consulting Physician (Urology) [name], MD as Consulting Physician (Nephrology) [name], MD as Referring Physician (Orthopedic Surgery) [name], MD as Attending Physician (Endocrinology) [name], MD as Referring Physician (Vascular Surgery) [name], [facility] as Pharmacist  (Pharmacist) [name], RN as Case Manager (General Practice)  Indicate any recent Medical Services you may have received from other than [facility] providers in the past year (date may be approximate).     Assessment:   This is a routine wellness examination for Tammy [name].  Hearing/Vision screen Vision Screening - Comments:: Regular eye exams, [facility]  Dietary issues and exercise activities discussed: Current Exercise Habits: The patient does not participate in regular exercise at present   Goals Addressed             This Visit's Progress    Patient Stated       12/20/2020, wants to get over lung cancer       Depression Screen PHQ 2/9 Scores 12/20/2020 12/19/2019 12/17/2019 11/11/2019 11/06/2018 10/31/2017 10/25/2016  PHQ - 2 Score 0 0 0 0 0 0 0  PHQ- 9 Score - - 0 0 - - -    Fall Risk Fall Risk  12/20/2020 02/10/2020 12/19/2019 11/11/2019 11/06/2018  Falls in the past year? 0 0 0 1 1  Number falls in past yr: - 0 - 0 0  Injury with Fall? - 0 - 0 0  Risk Factor Category  - - - - -  Risk for fall due to : Impaired balance/gait;Impaired mobility;Medication side effect Impaired balance/gait;Impaired mobility Impaired balance/gait;Impaired mobility;Medication side effect Impaired balance/gait;Impaired mobility History of fall(s);Impaired balance/gait  Follow up Falls evaluation completed;Education provided;Falls prevention discussed - Falls evaluation completed;Education provided;Falls prevention discussed - Education provided    FALL RISK PREVENTION PERTAINING TO THE HOME:  Any stairs in or around the home? Yes  If so, are there any without handrails? No  Home free of loose throw rugs in walkways, pet beds, electrical cords, etc? Yes  Adequate lighting in your home to reduce risk of falls? Yes   ASSISTIVE DEVICES UTILIZED TO PREVENT FALLS:  Life alert? No  Use of a cane, walker or w/c? Yes  Grab bars in the bathroom? Yes  Shower chair or bench in shower? Yes  Elevated  toilet seat or a handicapped toilet? Yes   TIMED UP AND GO:  Was the test performed? No .      Cognitive Function:     6CIT Screen 12/20/2020 12/19/2019 11/11/2019 10/31/2017 10/25/2016  What Year? 0 points 0 points 0 points 0 points 0 points  What month? 0 points 0 points 0 points 0 points 0 points  What time? 0 points 0 points 0 points 0 points 0 points  Count back from 20 0 points 0 points 0 points 0 points 0 points  Months in reverse 0 points 0 points 0 points 0 points 0 points  Repeat phrase 0 points 0 points 0 points 0 points 0 points  Total Score 0 0 0 0 0    Immunizations Immunization History  Administered Date(s) Administered   Fluad Quad(high Dose 65+) 01/02/2019, 02/10/2020   Influenza, High Dose Seasonal PF 11/16/2016, 12/18/2017  Influenza,inj,Quad PF,6+ Mos 12/16/2014, 11/24/2015   PFIZER(Purple Top)SARS-COV-2 Vaccination 05/08/2019, 05/29/2019   Pneumococcal Conjugate-13 07/08/2014   Pneumococcal Polysaccharide-23 03/13/2012   Zoster, Live 03/13/2010    TDAP status: Due, Education has been provided regarding the importance of this vaccine. Advised may receive this vaccine at local pharmacy or Health Dept. Aware to provide a copy of the vaccination record if obtained from local pharmacy or Health Dept. Verbalized acceptance and understanding.  Flu Vaccine status: Due, Education has been provided regarding the importance of this vaccine. Advised may receive this vaccine at local pharmacy or Health Dept. Aware to provide a copy of the vaccination record if obtained from local pharmacy or Health Dept. Verbalized acceptance and understanding.  Pneumococcal vaccine status: Up to date  Covid-19 vaccine status: Completed vaccines  Qualifies for Shingles Vaccine? Yes   Zostavax completed Yes   Shingrix Completed?: No.    Education has been provided regarding the importance of this vaccine. Patient has been advised to call insurance company to determine out of pocket  expense if they have not yet received this vaccine. Advised may also receive vaccine at local pharmacy or Health Dept. Verbalized acceptance and understanding.  Screening Tests Health Maintenance  Topic Date Due   TETANUS/TDAP  Never done   Zoster Vaccines- Shingrix (1 of 2) Never done   COVID-19 Vaccine (3 - Pfizer risk series) 06/26/2019   COLONOSCOPY (Pts 45-13yr Insurance coverage will need to be confirmed)  01/28/2020   INFLUENZA VACCINE  10/11/2020   HEMOGLOBIN A1C  02/18/2021   FOOT EXAM  08/19/2021   URINE MICROALBUMIN  08/19/2021   OPHTHALMOLOGY EXAM  11/09/2021   MAMMOGRAM  07/17/2022   DEXA SCAN  Completed   Hepatitis C Screening  Completed   HPV VACCINES  Aged Out    Health Maintenance  Health Maintenance Due  Topic Date Due   TETANUS/TDAP  Never done   Zoster Vaccines- Shingrix (1 of 2) Never done   COVID-19 Vaccine (3 - Pfizer risk series) 06/26/2019   COLONOSCOPY (Pts 45-459yrInsurance coverage will need to be confirmed)  01/28/2020   INFLUENZA VACCINE  10/11/2020    Colorectal cancer screening: due   Mammogram status: Completed 07/16/2020. Repeat every year  Bone Density status: Completed 05/26/2019.  Lung Cancer Screening: (Low Dose CT Chest recommended if Age 77-80ears, 30 pack-year currently smoking OR have quit w/in 15years.) does not qualify.   Lung Cancer Screening Referral: no  Additional Screening:  Hepatitis C Screening: does qualify; Completed 07/14/2016  Vision Screening: Recommended annual ophthalmology exams for early detection of glaucoma and other disorders of the eye. Is the patient up to date with their annual eye exam?  Yes  Who is the provider or what is the name of the office in which the patient attends annual eye exams? [facility] If pt is not established with a provider, would they like to be referred to a provider to establish care? No .   Dental Screening: Recommended annual dental exams for proper oral hygiene  Community  Resource Referral / Chronic Care Management: CRR required this visit?  No   CCM required this visit?  No      Plan:     I have personally reviewed and noted the following in the patient's chart:   Medical and social history Use of alcohol, tobacco or illicit drugs  Current medications and supplements including opioid prescriptions.  Functional ability and status Nutritional status Physical activity Advanced directives List of other physicians Hospitalizations,  surgeries, and ER visits in previous 12 months Vitals Screenings to include cognitive, depression, and falls Referrals and appointments  In addition, I have reviewed and discussed with patient certain preventive protocols, quality metrics, and best practice recommendations. A written personalized care plan for preventive services as well as general preventive health recommendations were provided to patient.     Kellie Simmering, LPN   [id number]   Nurse Notes:

## 2020-12-20 NOTE — Patient Instructions (Signed)
Tammy [name] , Thank you for taking time to come for your Medicare Wellness Visit. I appreciate your ongoing commitment to your health goals. Please review the following plan we discussed and let me know if I can assist you in the future.   Screening recommendations/referrals: Colonoscopy: due Mammogram: completed 07/16/2020 Bone Density: completed 05/26/2019 Recommended yearly ophthalmology/optometry visit for glaucoma screening and checkup Recommended yearly dental visit for hygiene and checkup  Vaccinations: Influenza vaccine: due Pneumococcal vaccine: completed 07/08/2014 Tdap vaccine: due Shingles vaccine: discussed   Covid-19: 05/29/2019, 05/08/2019  Advanced directives: Advance directive discussed with you today.   Conditions/risks identified: none  Next appointment: Follow up in one year for your annual wellness visit    Preventive Care 65 Years and Older, Female Preventive care refers to lifestyle choices and visits with your health care provider that can promote health and wellness. What does preventive care include? A yearly physical exam. This is also called an annual well check. Dental exams once or twice a year. Routine eye exams. Ask your health care provider how often you should have your eyes checked. Personal lifestyle choices, including: Daily care of your teeth and gums. Regular physical activity. Eating a healthy diet. Avoiding tobacco and drug use. Limiting alcohol use. Practicing safe sex. Taking low-dose aspirin every day. Taking vitamin and mineral supplements as recommended by your health care provider. What happens during an annual well check? The services and screenings done by your health care provider during your annual well check will depend on your age, overall health, lifestyle risk factors, and family history of disease. Counseling  Your health care provider may ask you questions about your: Alcohol use. Tobacco use. Drug use. Emotional  well-being. Home and relationship well-being. Sexual activity. Eating habits. History of falls. Memory and ability to understand (cognition). Work and work Statistician. Reproductive health. Screening  You may have the following tests or measurements: Height, weight, and BMI. Blood pressure. Lipid and cholesterol levels. These may be checked every 5 years, or more frequently if you are over 62 years old. Skin check. Lung cancer screening. You may have this screening every year starting at age 12 if you have a 30-pack-year history of smoking and currently smoke or have quit within the past 15 years. Fecal occult blood test (FOBT) of the stool. You may have this test every year starting at age 34. Flexible sigmoidoscopy or colonoscopy. You may have a sigmoidoscopy every 5 years or a colonoscopy every 10 years starting at age 46. Hepatitis C blood test. Hepatitis B blood test. Sexually transmitted disease (STD) testing. Diabetes screening. This is done by checking your blood sugar (glucose) after you have not eaten for a while (fasting). You may have this done every 1-3 years. Bone density scan. This is done to screen for osteoporosis. You may have this done starting at age 64. Mammogram. This may be done every 1-2 years. Talk to your health care provider about how often you should have regular mammograms. Talk with your health care provider about your test results, treatment options, and if necessary, the need for more tests. Vaccines  Your health care provider may recommend certain vaccines, such as: Influenza vaccine. This is recommended every year. Tetanus, diphtheria, and acellular pertussis (Tdap, Td) vaccine. You may need a Td booster every 10 years. Zoster vaccine. You may need this after age 101. Pneumococcal 13-valent conjugate (PCV13) vaccine. One dose is recommended after age 26. Pneumococcal polysaccharide (PPSV23) vaccine. One dose is recommended after age 44. Talk  to your  health care provider about which screenings and vaccines you need and how often you need them. This information is not intended to replace advice given to you by your health care provider. Make sure you discuss any questions you have with your health care provider. Document Released: 03/26/2015 Document Revised: 11/17/2015 Document Reviewed: 12/29/2014 Elsevier Interactive Patient Education  2017 [place] Prevention in the Home Falls can cause injuries. They can happen to people of all ages. There are many things you can do to make your home safe and to help prevent falls. What can I do on the outside of my home? Regularly fix the edges of walkways and driveways and fix any cracks. Remove anything that might make you trip as you walk through a door, such as a raised step or threshold. Trim any bushes or trees on the path to your home. Use bright outdoor lighting. Clear any walking paths of anything that might make someone trip, such as rocks or tools. Regularly check to see if handrails are loose or broken. Make sure that both sides of any steps have handrails. Any raised decks and porches should have guardrails on the edges. Have any leaves, snow, or ice cleared regularly. Use sand or salt on walking paths during winter. Clean up any spills in your garage right away. This includes oil or grease spills. What can I do in the bathroom? Use night lights. Install grab bars by the toilet and in the tub and shower. Do not use towel bars as grab bars. Use non-skid mats or decals in the tub or shower. If you need to sit down in the shower, use a plastic, non-slip stool. Keep the floor dry. Clean up any water that spills on the floor as soon as it happens. Remove soap buildup in the tub or shower regularly. Attach bath mats securely with double-sided non-slip rug tape. Do not have throw rugs and other things on the floor that can make you trip. What can I do in the bedroom? Use night  lights. Make sure that you have a light by your bed that is easy to reach. Do not use any sheets or blankets that are too big for your bed. They should not hang down onto the floor. Have a firm chair that has side arms. You can use this for support while you get dressed. Do not have throw rugs and other things on the floor that can make you trip. What can I do in the kitchen? Clean up any spills right away. Avoid walking on wet floors. Keep items that you use a lot in easy-to-reach places. If you need to reach something above you, use a strong step stool that has a grab bar. Keep electrical cords out of the way. Do not use floor polish or wax that makes floors slippery. If you must use wax, use non-skid floor wax. Do not have throw rugs and other things on the floor that can make you trip. What can I do with my stairs? Do not leave any items on the stairs. Make sure that there are handrails on both sides of the stairs and use them. Fix handrails that are broken or loose. Make sure that handrails are as long as the stairways. Check any carpeting to make sure that it is firmly attached to the stairs. Fix any carpet that is loose or worn. Avoid having throw rugs at the top or bottom of the stairs. If you do have throw rugs,  attach them to the floor with carpet tape. Make sure that you have a light switch at the top of the stairs and the bottom of the stairs. If you do not have them, ask someone to add them for you. What else can I do to help prevent falls? Wear shoes that: Do not have high heels. Have rubber bottoms. Are comfortable and fit you well. Are closed at the toe. Do not wear sandals. If you use a stepladder: Make sure that it is fully opened. Do not climb a closed stepladder. Make sure that both sides of the stepladder are locked into place. Ask someone to hold it for you, if possible. Clearly mark and make sure that you can see: Any grab bars or handrails. First and last  steps. Where the edge of each step is. Use tools that help you move around (mobility aids) if they are needed. These include: Canes. Walkers. Scooters. Crutches. Turn on the lights when you go into a dark area. Replace any light bulbs as soon as they burn out. Set up your furniture so you have a clear path. Avoid moving your furniture around. If any of your floors are uneven, fix them. If there are any pets around you, be aware of where they are. Review your medicines with your doctor. Some medicines can make you feel dizzy. This can increase your chance of falling. Ask your doctor what other things that you can do to help prevent falls. This information is not intended to replace advice given to you by your health care provider. Make sure you discuss any questions you have with your health care provider. Document Released: 12/24/2008 Document Revised: 08/05/2015 Document Reviewed: 04/03/2014 Elsevier Interactive Patient Education  2017 [employer].

## 2020-12-21 ENCOUNTER — Other Ambulatory Visit: Payer: Self-pay | Admitting: Family Medicine

## 2020-12-21 NOTE — Telephone Encounter (Signed)
Requested medications are due for refill today.  yes  Requested medications are on the active medications list.  yes  Last refill. 09/20/2020  Future visit scheduled.   yes  Notes to clinic.  Historical medication

## 2020-12-22 ENCOUNTER — Other Ambulatory Visit: Payer: Self-pay

## 2020-12-22 ENCOUNTER — Ambulatory Visit
Admission: RE | Admit: 2020-12-22 | Discharge: 2020-12-22 | Disposition: A | Discharge status: Home / Self Care | Payer: Medicare Other | Source: Ambulatory Visit | Attending: Radiation Oncology | Admitting: Radiation Oncology

## 2020-12-22 ENCOUNTER — Encounter: Payer: Self-pay | Admitting: Radiation Oncology

## 2020-12-22 ENCOUNTER — Other Ambulatory Visit: Payer: Self-pay | Admitting: *Deleted

## 2020-12-22 DIAGNOSIS — C3412 Malignant neoplasm of upper lobe, left bronchus or lung: Secondary | ICD-10-CM

## 2020-12-22 DIAGNOSIS — R911 Solitary pulmonary nodule: Secondary | ICD-10-CM | POA: Diagnosis not present

## 2020-12-22 DIAGNOSIS — Z853 Personal history of malignant neoplasm of breast: Secondary | ICD-10-CM | POA: Diagnosis not present

## 2020-12-22 DIAGNOSIS — Z87891 Personal history of nicotine dependence: Secondary | ICD-10-CM | POA: Diagnosis not present

## 2020-12-22 DIAGNOSIS — Z08 Encounter for follow-up examination after completed treatment for malignant neoplasm: Secondary | ICD-10-CM | POA: Diagnosis not present

## 2020-12-22 DIAGNOSIS — Z923 Personal history of irradiation: Secondary | ICD-10-CM | POA: Diagnosis not present

## 2020-12-22 NOTE — Progress Notes (Signed)
Radiation Oncology Follow up Note  Name: Tammy [name]   Date:   12/22/2020 MRN:  [id number] DOB: Aug 17, 1945    This 75 y.o. female presents to the clinic today for 107-month follow-up status post progressive disease in the left upper lobe in patient previous treated for left breast cancer with whole breast radiation stage I.  She had a probable stage I non-small cell lung cancer.[name]  REFERRING PROVIDER: [name], DO  HPI: Patient is a 75 year old female previously treated for stage I left breast cancer with whole breast radiation.  She is now 4 months out from SBRT to her left upper lobe from a probable stage I non-small cell lung cancer.  Seen today in routine follow-up she states her breathing is somewhat labored.  She has been prescribed inhalers although uses them infrequently.  She specifically denies cough hemoptysis or chest tightness..  Recent CT scan of her chest showed decrease in size of subpleural left upper lobe nodule status post radiation.  She had some groundglass and septal thickening slightly progressive from May possibly due to nonspecific interstitial pneumonitis.  COMPLICATIONS OF TREATMENT: none  FOLLOW UP COMPLIANCE: keeps appointments   PHYSICAL EXAM:  BP (P) 128/68 (BP Location: Right Arm, Patient Position: Sitting)   Pulse (P) 83   Temp (!) (P) 97.4 F (36.3 C) (Tympanic)   Resp (P) 18   Wt (P) 215 lb (97.5 kg)   BMI (P) 36.90 kg/m  Well-developed well-nourished patient in NAD. HEENT reveals PERLA, EOMI, discs not visualized.  Oral cavity is clear. No oral mucosal lesions are identified. Neck is clear without evidence of cervical or supraclavicular adenopathy. Lungs are clear to A&P. Cardiac examination is essentially unremarkable with regular rate and rhythm without murmur rub or thrill. Abdomen is benign with no organomegaly or masses noted. Motor sensory and DTR levels are equal and symmetric in the upper and lower extremities. Cranial nerves II through XII  are grossly intact. Proprioception is intact. No peripheral adenopathy or edema is identified. No motor or sensory levels are noted. Crude visual fields are within normal range.  RADIOLOGY RESULTS: CT scan reviewed compatible with above-stated findings  PLAN: Present time she has had an excellent result from SBRT.  We will track the lesion with a CT scan in 6 months and follow-up at that time.  I have also asked the patient to make a appointment with Dr. Patsey [name] who is her pulmonologist for pulmonary tune up.  I have also suggested she uses her inhalers as prescribed.  Patient comprehends my recommendations well.  I would like to take this opportunity to thank you for allowing me to participate in the care of your patient.Tammy Filbert, MD

## 2020-12-27 ENCOUNTER — Telehealth: Payer: Self-pay | Admitting: Family Medicine

## 2020-12-27 NOTE — Telephone Encounter (Signed)
Patient's spouse brought in completed medication financial assistance application.  Placed in [place] folder.

## 2020-12-28 ENCOUNTER — Ambulatory Visit: Payer: Self-pay

## 2020-12-28 ENCOUNTER — Telehealth: Payer: Medicare Other | Admitting: General Practice

## 2020-12-28 DIAGNOSIS — Z794 Long term (current) use of insulin: Secondary | ICD-10-CM

## 2020-12-28 DIAGNOSIS — E1165 Type 2 diabetes mellitus with hyperglycemia: Secondary | ICD-10-CM

## 2020-12-28 DIAGNOSIS — I10 Essential (primary) hypertension: Secondary | ICD-10-CM

## 2020-12-28 DIAGNOSIS — M546 Pain in thoracic spine: Secondary | ICD-10-CM

## 2020-12-28 DIAGNOSIS — E1122 Type 2 diabetes mellitus with diabetic chronic kidney disease: Secondary | ICD-10-CM

## 2020-12-28 DIAGNOSIS — Z9181 History of falling: Secondary | ICD-10-CM

## 2020-12-28 DIAGNOSIS — J449 Chronic obstructive pulmonary disease, unspecified: Secondary | ICD-10-CM

## 2020-12-28 NOTE — Chronic Care Management (AMB) (Signed)
Chronic Care Management   CCM RN Visit Note  12/28/2020 Name: Tammy [name] MRN: [id number] DOB: 1945/09/25  Subjective: Tammy [name] is a 75 y.o. year old female who is a primary care patient of [name], DO. The care management team was consulted for assistance with disease management and care coordination needs.    Engaged with patient by telephone for follow up visit in response to provider referral for case management and/or care coordination services.   Consent to Services:  The patient was given information about Chronic Care Management services, agreed to services, and gave verbal consent prior to initiation of services.  Please see initial visit note for detailed documentation.   Patient agreed to services and verbal consent obtained.   Assessment: Review of patient past medical history, allergies, medications, health status, including review of consultants reports, laboratory and other test data, was performed as part of comprehensive evaluation and provision of chronic care management services.   SDOH (Social Determinants of Health) assessments and interventions performed:  SDOH Interventions    Flowsheet Row Most Recent Value  SDOH Interventions   Food Insecurity Interventions Intervention Not Indicated  Financial Strain Interventions Other (Comment)  [the patient asking for assistance with medication cost]  Housing Interventions Intervention Not Indicated  Intimate Partner Violence Interventions Intervention Not Indicated  Physical Activity Interventions Other (Comments)  [walk when she can, back pain and limited]  Social Connections Interventions Intervention Not Indicated  Transportation Interventions Intervention Not Indicated        CCM Care Plan  Allergies  Allergen Reactions   Hydrocodone Other (See Comments)    Unresponsive   Aleve [Naproxen Sodium]     Hives & itching    Contrast Media [Iodinated Diagnostic Agents]     Pt avoids due to kidney  issues   Ioxaglate Other (See Comments)    (iodine) Pt avoids due to kidney issues    Oxycodone Other (See Comments)    hallucinations   Ranexa [Ranolazine]     Sick on stomach   Sulfa Antibiotics Other (See Comments)    Pt avoids due to kidney issues   Tramadol Other (See Comments)    nightmares    Outpatient Encounter Medications as of 12/28/2020  Medication Sig   anastrozole (ARIMIDEX) 1 MG tablet Take 1 tablet (1 mg total) by mouth daily.   apixaban (ELIQUIS) 2.5 MG TABS tablet Take 1 tablet (2.5 mg total) by mouth 2 (two) times daily.   blood glucose meter kit and supplies KIT Dispense based on patient and insurance preference. Use one time daily as directed, E11.9   Calcium Carb-Cholecalciferol (CALCIUM 600 + D) 600-200 MG-UNIT TABS Take 1 tablet by mouth daily.    cetirizine (ZYRTEC) 10 MG tablet TAKE 1 TABLET BY MOUTH DAILY   cholecalciferol (VITAMIN D3) 25 MCG (1000 UT) tablet Take 1,000 Units by mouth daily.   ciprofloxacin (CIPRO) 500 MG tablet Take 1 tablet (500 mg total) by mouth 2 (two) times daily. (Patient not taking: No sig reported)   cyanocobalamin (,VITAMIN B-12,) 1000 MCG/ML injection Inject 1,000 mcg into the muscle once a week. Then once a month x 4 months (Patient not taking: No sig reported)   DULoxetine (CYMBALTA) 60 MG capsule Take 2 capsules (120 mg total) by mouth daily.   empagliflozin (JARDIANCE) 25 MG TABS tablet Take 1 tablet (25 mg total) by mouth daily. TAKE ONE TABLET 25MG EVERY DAY BEFORE BREAKFAST   gabapentin (NEURONTIN) 300 MG capsule Take 1 capsule (300  mg total) by mouth 3 (three) times daily.   glipiZIDE (GLUCOTROL) 5 MG tablet TAKE ONE TABLET BY MOUTH EVERY DAY BEFORE BREAKFAST   Insulin Pen Needle (PEN NEEDLES 3/16") 31G X 5 MM MISC 1 each by Does not apply route daily.   isosorbide mononitrate (IMDUR) 60 MG 24 hr tablet TAKE ONE TABLET BY MOUTH AT BEDTIME AND HALF A TABLET EVERY MORNING (Patient taking differently: TAKE ONE TABLET BY MOUTH  AT BEDTIME)   Lactobacillus (PROBIOTIC ACIDOPHILUS PO) Take 1 capsule by mouth daily at 12 noon.   metoprolol succinate (TOPROL-XL) 50 MG 24 hr tablet TAKE 1 TABLET BY MOUTH DAILY WITH OR IMMEDIATLEY FOLLOWING A MEAL   montelukast (SINGULAIR) 10 MG tablet Take 1 tablet (10 mg total) by mouth at bedtime.   MYRBETRIQ 50 MG TB24 tablet TAKE 1 TABLET BY MOUTH DAILY   nitroGLYCERIN (NITROSTAT) 0.4 MG SL tablet Place 1 tablet (0.4 mg total) under the tongue every 5 (five) minutes as needed for chest pain. Total of 3 doses   pantoprazole (PROTONIX) 40 MG tablet TAKE 1 TABLET BY MOUTH ONCE DAILY   PROAIR HFA 108 (90 Base) MCG/ACT inhaler TAKE 2 PUFFS EVERY 4 HOURS AS NEEDED FORWHEEZING OR SHORTNESS OF BREATH   Pumpkin Seed-Soy Germ (AZO BLADDER CONTROL/GO-LESS PO) Take 1 tablet by mouth in the morning and at bedtime.   rosuvastatin (CRESTOR) 40 MG tablet Take 1 tablet (40 mg total) by mouth daily.   senna (SENOKOT) 8.6 MG TABS tablet Take 8.6 mg by mouth daily as needed for mild constipation.   SYNTHROID 150 MCG tablet TAKE ONE TABLET ON AN EMPTY STOMACH WITHA GLASS OF WATER AT LEAST 30 TO 60 MINUTES BEFORE BREAKFAST   TRESIBA FLEXTOUCH 100 UNIT/ML FlexTouch Pen INJECT 24 UNITS SUBCUTANEOUSLY DAILY   trimethoprim (TRIMPEX) 100 MG tablet TAKE ONE TABLET BY MOUTH EVERY DAY   TRULICITY 4.5 ZP/9.1TA SOPN INJECT 4.5MG ONCE WEEKLY AS DIRECTED   No facility-administered encounter medications on file as of 12/28/2020.    Patient Active Problem List   Diagnosis Date Noted   Lung nodule 08/19/2020   Fall 04/17/2020   Anemia 02/16/2020   Asthma 02/16/2020   History of cardiac catheterization 02/16/2020   History of left mastectomy 02/16/2020   Malignant tumor of breast ([place]) 02/16/2020   History of total knee arthroplasty 11/18/2019   Unstable angina ([place]) 04/03/2019   Dysphagia    Thickening of esophagus    Stomach irritation    Gastric polyp    Positive occult stool blood test    Polyp of colon     Hyperkalemia 11/27/2018   Proteinuria 11/27/2018   Type 2 diabetes mellitus with stage 3 chronic kidney disease ([place]) 11/27/2018   Carcinoma of lower-inner quadrant of left breast in female, estrogen receptor positive ([place]) 10/02/2018   Right knee pain 08/01/2017   Chronic right-sided low back pain with right-sided sciatica 05/28/2017   Gait abnormality 05/28/2017   Hypothyroid 07/18/2016   COPD (chronic obstructive pulmonary disease) ([place]) 07/18/2016   Encephalomalacia on imaging study 12/10/2015   Calcification of both carotid arteries 12/10/2015   Benign neoplasm of sigmoid colon    Benign neoplasm of cecum    Benign neoplasm of ascending colon    Bilateral atelectasis 04/11/2015   Fatty liver disease, nonalcoholic 56/97/9480   Hydronephrosis of right kidney 04/11/2015   Abdominal aortic ectasia ([place]) 04/11/2015   Constipation 04/07/2015   Abdominal aortic atherosclerosis ([place]) 12/18/2014   Diverticulosis of colon 12/18/2014   Rhinitis, allergic  10/20/2014   Coronary artery disease    Essential hypertension    Morbid obesity due to excess calories ([place])    Spinal stenosis of lumbar region 06/06/2014   Herniated nucleus pulposus 06/06/2014   Neuralgia neuritis, sciatic nerve 05/24/2014   Pulmonary embolism ([place]) 01/30/2014   Benign hypertensive renal disease 12/16/2013   Uncontrolled type 2 diabetes mellitus with hyperglycemia, with long-term current use of insulin ([place]) 12/16/2013   Chronic kidney disease 12/16/2013   Frequent UTI 11/03/2013   Overactive bladder 11/03/2013   Coronary atherosclerosis of native coronary artery 06/19/2013   History of back surgery 06/19/2013   Hyperlipidemia 06/19/2013   PAD (peripheral artery disease) ([place]) 06/19/2013   Congenital obstruction of ureteropelvic junction 11/21/2011    Conditions to be addressed/monitored:HTN, COPD, DMII, and Chronic back pain, falls prevention and safety  Care Plan : RNCM: Chronic Pain (Adult)  Updates made  by [name], RN since 12/28/2020 12:00 AM     Problem: RNCM: Pain Management Plan (Chronic Pain)   Priority: High     Long-Range Goal: RNCM: Pain Management Plan Developed   Start Date: 05/11/2020  Expected End Date: 10/07/2021  This Visit's Progress: On track  Recent Progress: On track  Priority: High  Note:   CARE PLAN ENTRY (see longitudinal plan of care for additional care plan information)  Current Barriers:  Knowledge Deficits related to how to best control post operative knee replacement pain- right knee Chronic Disease Management support and education needs related to chronic pain and discomfort related to knee pain and post surgical knee replacement surgery and back pain  Nurse Case Manager Clinical Goal(s):  patient will verbalize understanding of plan for pain management for knee pain  patient will attend all scheduled medical appointments: recommended the patient call the office for a follow up patient will demonstrate improved adherence to prescribed treatment plan for pain relief of post surgical right knee pain as evidenced bypain relief and increased activity level  patient will demonstrate improved health management independence as evidenced byrecovery from knee replacement surgery   Interventions:  Inter-disciplinary care team collaboration (see longitudinal plan of care) Evaluation of current treatment plan related to pain control and patient's adherence to plan as established by provider. 05-11-2020: The patient states she is still having pain in her right knee with popping at times. Evaluation if she has dicussed with the surgeon and she has not. The patient will talk to the pcp today about this. The patient also has back pain and discomfort. Had a fall recently and went to the hospital to be evaluated. Had a UTI. Has finished course of abx.  10-12-2020: The patient is now experience thoracic pain and will see a specialist on 10-14-2020 for evaluation and  recommendation. The patient is using voltaran gel but that does not help much. Recommended cold or heat application. The patient has not tried this yet. The patient states that she may try this. She says when she is sitting down it does not hurt. No correlation to breathing. She is hopeful for answers on Thursday. 12-28-2020: The patient states her knee does not bother her it is her back and she cannot even life a gallon of milk without it feeling like it has a vise around it. The patient is compliant with medications and is aware of safety. Denies any new falls. Education and support given.  Advised patient to call provider for changes in level or intensity of pain and discomfort. 05-11-2020: Will discuss back pain and  knee pain today with the provider. 10-12-2020: Saw pcp recently and follows up with specialist for thoracic pain on 10-14-2020. 12-28-2020: Advised to follow up with providers as needed.  Provided education to patient re: alternative methods of pain control and to continue to work with PT to manage pain and discomfort. 12-28-2020: Review of alternate methods for pain control. Will continue to monitor.  Reviewed medications with patient and discussed patient states opioids make her sick on her stomach. Does not like to take them but will for uncontrolled pain.  Reviewed scheduled/upcoming provider appointments including: 12-28-2020: No upcoming appointments to see the pcp but know to call for changes.Recommended to call the office for a follow up since it has been a while since the patient has been seen by pcp.   Patient Self Care Activities:  Patient verbalizes understanding of plan to work with PT and call the provider for changes in pain level Attends all scheduled provider appointments Calls provider office for new concerns or questions - pain assessed= 10-12-2020: Still having a lot of unresolved pain and discomfort. Will see specialist this week. Is hopeful for some answers to her pain and  what to do to relieve pain. 12-28-2020: The patient states she is managing her pain. - pain treatment goals reviewed - patient response to treatment assessed  Follow up at 02-22-2021     Care Plan : RNCM: Hypertension (Adult)  Updates made by [name], RN since 12/28/2020 12:00 AM     Problem: RNCM: Hypertension (Hypertension)   Priority: Medium     Long-Range Goal: RNCM: Hypertension Monitored   Start Date: 05/11/2020  Expected End Date: 10/07/2021  This Visit's Progress: On track  Recent Progress: On track  Priority: Medium  Note:   Objective:  Last practice recorded BP readings:  BP Readings from Last 3 Encounters:  12/22/20 (P) 128/68  11/10/20 125/70  10/28/20 133/82   Most recent eGFR/CrCl:  Lab Results  Component Value Date   EGFR 32 (L) 11/09/2020    No components found for: CRCL Current Barriers:  Knowledge Deficits related to basic understanding of hypertension pathophysiology and self care management Knowledge Deficits related to understanding of medications prescribed for management of hypertension Unable to independently manage HTN  Case Manager Clinical Goal(s):  patient will verbalize understanding of plan for hypertension management patient will demonstrate improved adherence to prescribed treatment plan for hypertension as evidenced by taking all medications as prescribed, monitoring and recording blood pressure as directed, adhering to low sodium/DASH diet patient will demonstrate improved health management independence as evidenced by checking blood pressure as directed and notifying PCP if SBP>160 or DBP > 90, taking all medications as prescribe, and adhering to a low sodium diet as discussed. patient will verbalize basic understanding of hypertension disease process and self health management plan as evidenced by compliance with medications, compliance with heart heatlhy/ADA diet and working with the CCM team to optimize health and well being.   Interventions:  Collaboration with [name], DO regarding development and update of comprehensive plan of care as evidenced by provider attestation and co-signature Inter-disciplinary care team collaboration (see longitudinal plan of care) Evaluation of current treatment plan related to hypertension self management and patient's adherence to plan as established by provider. 12-28-2020: Denies any issues with her HTN. She states that her pressure is usually at 120/70.  Provided education to patient re: stroke prevention, s/s of heart attack and stroke, DASH diet, complications of uncontrolled blood pressure Reviewed medications with patient  and discussed importance of compliance. 12-28-2020: The patient states she is compliant with her medications  Discussed plans with patient for ongoing care management follow up and provided patient with direct contact information for care management team Advised patient, providing education and rationale, to monitor blood pressure daily and record, calling PCP for findings outside established parameters.  Self-Care Activities: - Self administers medications as prescribed Attends all scheduled provider appointments Calls provider office for new concerns, questions, or BP outside discussed parameters Checks BP and records as discussed Follows a low sodium diet/DASH diet Patient Goals: - check blood pressure weekly - choose a place to take my blood pressure (home, clinic or office, retail store) - write blood pressure results in a log or diary - agree on reward when goals are met - agree to work together to make changes - ask questions to understand - have a family meeting to talk about healthy habits - learn about high blood pressure  Follow Up Plan: Telephone follow up appointment with care management team member scheduled for: 02-22-2021 at 53 am    Care Plan : RNCM:Diabetes Type 2 (Adult)  Updates made by [name], RN since 12/28/2020  12:00 AM     Problem: RNCM: Glycemic Management (Diabetes, Type 2)   Priority: Medium  Note:   CARE PLAN ENTRY (see longtitudinal plan of care for additional care plan information)  Objective:  Lab Results  Component Value Date   HGBA1C 7.7 (H) 11/11/2019   Lab Results  Component Value Date   CREATININE 1.61 (H) 11/27/2019   CREATININE 1.77 (H) 11/11/2019   CREATININE 1.49 (H) 09/02/2019   No results found for: EGFR  Current Barriers:  Knowledge Deficits related to basic Diabetes pathophysiology and self care/management Knowledge Deficits related to medications used for management of diabetes  Case Manager Clinical Goal(s):  Over the next 120 days, patient will demonstrate improved adherence to prescribed treatment plan for diabetes self care/management as evidenced by:  daily monitoring and recording of CBG  adherence to ADA/ carb modified diet exercise 3/4 days/week adherence to prescribed medication regimen  Interventions:  Provided education to patient about basic DM disease process Reviewed medications with patient and discussed importance of medication adherence Discussed plans with patient for ongoing care management follow up and provided patient with direct contact information for care management team Provided patient with written educational materials related to hypo and hyperglycemia and importance of correct treatment Reviewed scheduled/upcoming provider appointments including: 02-10-2020 at 2:20 pm Advised patient, providing education and rationale, to check cbg BID and record, calling pcp for findings outside established parameters.   Review of patient status, including review of consultants reports, relevant laboratory and other test results, and medications completed.  Patient Self Care Activities:  UNABLE to independently manage DM as evidence of hemoglobin A1C of 7.7 on 11-11-2019  Initial goal documentation     Long-Range Goal: RNCM: Glycemic  Management Optimized   Start Date: 05/11/2020  Expected End Date: 08/28/2021  This Visit's Progress: On track  Recent Progress: On track  Priority: Medium  Note:   Objective:  Lab Results  Component Value Date   HGBA1C 7.9 (H) 08/19/2020    Lab Results  Component Value Date   CREATININE 1.66 (H) 11/09/2020   CREATININE 1.62 (H) 11/02/2020   CREATININE 1.66 (H) 10/28/2020    No results found for: EGFR Current Barriers:  Knowledge Deficits related to basic Diabetes pathophysiology and self care/management Knowledge Deficits related to medications used for management of  diabetes Unable to independently manage DM as evidence of elevated A1C at times, 10-12-2020: The patient's most recent hemoglobin A1C 7.9 Case Manager Clinical Goal(s):   patient will demonstrate improved adherence to prescribed treatment plan for diabetes self care/management as evidenced by:  daily monitoring and recording of CBG  adherence to ADA/ carb modified diet adherence to prescribed medication regimen Interventions:  Provided education to patient about basic DM disease process Discussed plans with patient for ongoing care management follow up and provided patient with direct contact information for care management team Provided patient with written educational materials related to hypo and hyperglycemia and importance of correct treatment Reviewed scheduled/upcoming provider appointments including: no upcoming appointment with pcp. Knows to call for changes or needs Advised patient, providing education and rationale, to check cbg TID and record, calling pcp for findings outside established parameters.  05-11-2020: Blood sugars elevated since taking prednisone. States they are stabilized.  Denies any blood sugars >200.  States readings recently 118-140. 10-12-2020: The patient states that her blood sugars have been up and down recently. She has a current UTI and believes the medications she is taking is causing her to  have elevated blood sugars. States the highest she has had is 171. Education and support given. Will continue to monitor for changes. 12-28-2020: The patient states her blood sugars are up and down. The patient states that her blood sugar this am was 153.  States the range is 100 to 283. Knows when it is getting high. Extensive review of dietary restrictions and what the patient is eating. The patient has not been eating snacks. States that she thinks if she doesn't eat her blood sugars with go down. Education on how the body works with DM. Will send education information in the mail.  Review of patient status, including review of consultants reports, relevant laboratory and other test results, and medications completed. Patient Goals/Self-Care Activities , patient will:  - Checks blood sugars as prescribed and utilize hyper and hypoglycemia protocol as needed Adheres to prescribed ADA/carb modified Follow Up Plan:  Telephone follow up appointment with care management team member scheduled for: 02-22-2021 at 45 am    Care Plan : RNCM: COPD (Adult)  Updates made by [name], RN since 12/28/2020 12:00 AM     Problem: RNCM: Psychological Adjustment to Diagnosis (COPD)   Priority: Medium     Long-Range Goal: RNCM: COPD/Pneumonia/Lung nodule   Start Date: 05/11/2020  Expected End Date: 10/07/2021  This Visit's Progress: On track  Recent Progress: On track  Priority: Medium  Note:   Current Barriers:  Knowledge deficits related to basic understanding of COPD/pneumonia/ lung nodule disease process Knowledge deficits related to basic COPD/ lung nodule/pneumonia self care/management Knowledge deficit related to basic understanding of how to use inhalers and how inhaled medications work Knowledge deficit related to importance of energy conservation Unable to independently manage COPD/pneumonia and recent diagnosis of lung nodule Lacks social connections Unable to perform IADLs  independently Does not contact provider office for questions/concerns  Case Manager Clinical Goal(s):  patient will report using inhalers as prescribed including rinsing mouth after use patient will be able to verbalize understanding of COPD action plan and when to seek appropriate levels of medical care patient will engage in lite exercise as tolerated to build/regain stamina and strength and reduce shortness of breath through activity tolerance  patient will verbalize basic understanding of COPD disease process and self care activities  patient will not be hospitalized for COPD exacerbation  as evidenced  Interventions:  Collaboration with [name], DO regarding development and update of comprehensive plan of care as evidenced by provider attestation and co-signature Inter-disciplinary care team collaboration (see longitudinal plan of care) Provided patient with basic written and verbal COPD/Pneumonia/Lung nodule education on self care/management/and exacerbation prevention  Provided patient with COPD /Pneumonia/Lung nodule action plan and reinforced importance of daily self assessment. 10-12-2020: The patient states that she is doing about the same with her COPD, has seen the pulmonologist recently and has not had any changes in the plan of care. Will continue to monitor for needs. 12-28-2020: Denies any issues with her COPD. States her respiratory status is stable. Will continue to monitor.  Provided written and verbal instructions on pursed lip breathing and utilized returned demonstration as teach back Provided instruction about proper use of medications used for management of COPD including inhalers Advised patient to self assesses COPD/Pneumonia/Lung nodule  action plan zone and make appointment with provider if in the yellow zone for 48 hours without improvement. Provided patient with education about the role of exercise in the management of COPD Advised patient to engage in light  exercise as tolerated 3-5 days a week Provided education about and advised patient to utilize infection prevention strategies to reduce risk of respiratory infection  Patient Goals/Self-Care Activities:  - decision-making supported - depression screen reviewed - emotional support provided - family involvement promoted - problem-solving facilitated - relaxation techniques promoted - verbalization of feelings encouraged Follow Up Plan: Telephone follow up appointment with care management team member scheduled for: 02-22-2021 at 57 am    Care Plan : [facility]; Fall Risk (Adult)  Updates made by [name], RN since 12/28/2020 12:00 AM     Problem: RNCM: Fall Risk   Priority: High     Long-Range Goal: RNCM: Absence of Fall and Fall-Related Injury   Start Date: 05/11/2020  Expected End Date: 08/28/2021  This Visit's Progress: On track  Recent Progress: On track  Priority: High  Note:   Current Barriers:  Knowledge Deficits related to fall precautions in patient with chronic pain, and several other chronic conditions impacting care  Decreased adherence to prescribed treatment for fall prevention Unable to perform IADLs independently Does not contact provider office for questions/concerns Chronic Disease Management support and education needs related to increased falls risk in patient with multiple chronic conditions  Clinical Goal(s):  patient will demonstrate improved adherence to prescribed treatment plan for decreasing falls as evidenced by patient reporting and review of EMR patient will verbalize using fall risk reduction strategies discussed patient will not experience additional falls patient will verbalize understanding of plan for effective management of falls prevention and safety in the home Interventions:  Collaboration with [name], DO regarding development and update of comprehensive plan of care as evidenced by provider attestation and  co-signature Inter-disciplinary care team collaboration (see longitudinal plan of care) Provided written and verbal education re: Potential causes of falls and Fall prevention strategies Reviewed medications and discussed potential side effects of medications such as dizziness and frequent urination Assessed for s/s of orthostatic hypotension Assessed for falls since last encounter. 10-12-2020: The patient denies any new falls at this time. The patient states that she is being very careful and monitoring her activity. Education and support given. Will continue to monitor. 12-28-2020: Denies any new falls at this time. Will continue to monitor. States she is being safe. Assessed patients knowledge of fall risk prevention secondary to previously provided education. Assessed  working status of life alert bracelet and patient adherence Provided patient information for fall alert systems Evaluation of current treatment plan related to falls prevention and safety  and patient's adherence to plan as established by provider. 10-12-2020: The patient has a lot of health issues going on right now and is seeing several specialist. She is remaining optimistic and being safe in her home environment. Will continue to monitor. 12-28-2020: The patient is still dealing with a lot of back pain but she is stable and is being safe. Denies any new falls or safety concerns. Advised patient to call the office for new falls/changes in chronic conditions or questions Provided education to patient re: falls prevention and safety and monitoring for changes in conditions that may put patient at increase risk of falls Provided patient with falls prevention  educational materials related to preventing falls and being safe in home environment  Discussed plans with patient for ongoing care management follow up and provided patient with direct contact information for care management team Self-Care Deficits:  Lacks social  connections Unable to perform IADLs independently Does not contact provider office for questions/concerns Patient Goals:  - Utilize walker and wheelchair (assistive device) appropriately with all ambulation - De-clutter walkways - Change positions slowly - Wear secure fitting shoes at all times with ambulation - Utilize home lighting for dim lit areas - Demonstrate self and pet awareness at all times - activities of daily living skills assessed - assistive or adaptive device use encouraged - barriers to physical activity or exercise addressed - barriers to physical activity or exercise identified - barriers to safety identified - cognition assessed - cognitive-stimulating activities promoted - fall prevention plan reviewed and updated - fear of falling, loss of independence and pain acknowledged Follow Up Plan: Telephone follow up appointment with care management team member scheduled for: 02-22-2021 at 1030 am      Plan:Telephone follow up appointment with care management team member scheduled for:  02-22-2021 at 89 am  Noreene Larsson RN, MSN, [facility] Mobile: [phone]

## 2020-12-28 NOTE — Patient Instructions (Signed)
Visit Information  PATIENT GOALS:  Goals Addressed             This Visit's Progress    RNCM: Monitor and Manage My Blood Sugar       Follow Up : 02-22-2021:    - check blood sugar at prescribed times - check blood sugar if I feel it is too high or too low - take the blood sugar log to all doctor visits    Why is this important?   Checking your blood sugar at home helps to keep it from getting very high or very low.  Writing the results in a diary or log helps the doctor know how to care for you.  Your blood sugar log should have the time, date and the results.  Also, write down the amount of insulin or other medicine that you take.  Other information, like what you ate, exercise done and how you were feeling, will also be helpful.   Lab Results  Component Value Date   HGBA1C 7.9 (H) 08/19/2020      Notes: Is compliant with blood sugar checks. Current readings 100-150 Most recent hemoglobin A1C 7.0 on 02-10-2020. 10-12-2020: The patient states blood sugars have been up and down. States highest it has been lately is 171. Has had a UTI and taking antibiotics. Education and support given. Will continue to monitor. 12-28-2020: The patient states her blood sugars are up and down. This am was 153. Education on monitoring dietary intake and foods. Also the patient has not been eating snacks.  The patient states if she doesn't eat she feels like her blod sugars should go down. Education on of the body system works. Will send educational information to the patient. Will continue to monitor.      RNCM: Prevent Falls and Injury       Follow Up Date 02-22-2021   - always use handrails on the stairs - always wear low-heeled or flat shoes or slippers with nonskid soles - call the doctor if I am feeling too drowsy - install bathroom grab bars - keep a flashlight by the bed - keep my cell phone with me always - learn how to get back up if I fall - make an emergency alert plan in case I fall -  pick up clutter from the floors - use a nonslip pad with throw rugs, or remove them completely - use a cane or walker - use a nightlight in the bathroom - wear my glasses and/or hearing aid    Why is this important?   Most falls happen when it is hard for you to walk safely. Your balance may be off because of an illness. You may have pain in your knees, hip or other joints.  You may be overly tired or taking medicines that make you sleepy. You may not be able to see or hear clearly.  Falls can lead to broken bones, bruises or other injuries.  There are things you can do to help prevent falling.     Notes: Fall in February with admit to the hospital- UTI. 10-12-2020: The patient denies any new falls. States she is being very careful. Will continue to monitor. 12-28-2020: Denies any new falls. Is at high fall risk but denies any acute issues and is being safe.     RNCM: Set My Target A1C       Follow Up Date 02-22-2021   - set target A1C    Why is this important?  Your target A1C is decided together by you and your doctor.  It is based on several things like your age and other health issues.    Notes: the patients current hemoglobin A1C is 7.7 on 11-11-2019 Most recent hemoglobin A1C 7.0 on 02-10-2020 Lab Results  Component Value Date   HGBA1C 7.9 (H) 08/19/2020    10-12-2020: The patient states that her blood sugars have been up and down. Had a kidney infection. Review of monitoring blood sugars and effective management of blood sugars to maintain A1c. 12-28-2020: Reviewed with the patient the goal of A1C of <7.0. Will continue to monitor.         The patient verbalized understanding of instructions, educational materials, and care plan provided today and agreed to receive a mailed copy of patient instructions, educational materials, and care plan.   Telephone follow up appointment with care management team member scheduled for: 02-22-2021 at 80 am  [place], MSN,  [facility] Mobile: [phone]  Carbohydrate Counting for Diabetes Mellitus, Adult Carbohydrate counting is a method of keeping track of how many carbohydrates you eat. Eating carbohydrates naturally increases the amount of sugar (glucose) in the blood. Counting how many carbohydrates you eat improves your blood glucose control, which helps you manage your diabetes. It is important to know how many carbohydrates you can safely have in each meal. This is different for every person. A dietitian can help you make a meal plan and calculate how many carbohydrates you should have at each meal and snack. What foods contain carbohydrates? Carbohydrates are found in the following foods: Grains, such as breads and cereals. Dried beans and soy products. Starchy vegetables, such as potatoes, peas, and corn. Fruit and fruit juices. Milk and yogurt. Sweets and snack foods, such as cake, cookies, candy, chips, and soft drinks. How do I count carbohydrates in foods? There are two ways to count carbohydrates in food. You can read food labels or learn standard serving sizes of foods. You can use either of the methods or a combination of both. Using the Nutrition Facts label The Nutrition Facts list is included on the labels of almost all packaged foods and beverages in the [place] It includes: The serving size. Information about nutrients in each serving, including the grams (g) of carbohydrate per serving. To use the Nutrition Facts: Decide how many servings you will have. Multiply the number of servings by the number of carbohydrates per serving. The resulting number is the total amount of carbohydrates that you will be having. Learning the standard serving sizes of foods When you eat carbohydrate foods that are not packaged or do not include Nutrition Facts on the label, you need to measure the servings in order to count  the amount of carbohydrates. Measure the foods that you will eat with a food scale or measuring cup, if needed. Decide how many standard-size servings you will eat. Multiply the number of servings by 15. For foods that contain carbohydrates, one serving equals 15 g of carbohydrates. For example, if you eat 2 cups or 10 oz (300 g) of strawberries, you will have eaten 2 servings and 30 g of carbohydrates (2 servings x 15 g = 30 g). For foods that have more than one food mixed, such as soups and casseroles, you must count the carbohydrates in each food that is included. The following list contains standard serving sizes of common carbohydrate-rich foods. Each of these servings  has about 15 g of carbohydrates: 1 slice of bread. 1 six-inch (15 cm) tortilla. ? cup or 2 oz (53 g) cooked rice or pasta.  cup or 3 oz (85 g) cooked or canned, drained and rinsed beans or lentils.  cup or 3 oz (85 g) starchy vegetable, such as peas, corn, or squash.  cup or 4 oz (120 g) hot cereal.  cup or 3 oz (85 g) boiled or mashed potatoes, or  or 3 oz (85 g) of a large baked potato.  cup or 4 fl oz (118 mL) fruit juice. 1 cup or 8 fl oz (237 mL) milk. 1 small or 4 oz (106 g) apple.  or 2 oz (63 g) of a medium banana. 1 cup or 5 oz (150 g) strawberries. 3 cups or 1 oz (24 g) popped popcorn. What is an example of carbohydrate counting? To calculate the number of carbohydrates in this sample meal, follow the steps shown below. Sample meal 3 oz (85 g) chicken breast. ? cup or 4 oz (106 g) brown rice.  cup or 3 oz (85 g) corn. 1 cup or 8 fl oz (237 mL) milk. 1 cup or 5 oz (150 g) strawberries with sugar-free whipped topping. Carbohydrate calculation Identify the foods that contain carbohydrates: Rice. Corn. Milk. Strawberries. Calculate how many servings you have of each food: 2 servings rice. 1 serving corn. 1 serving milk. 1 serving strawberries. Multiply each number of servings by 15 g: 2  servings rice x 15 g = 30 g. 1 serving corn x 15 g = 15 g. 1 serving milk x 15 g = 15 g. 1 serving strawberries x 15 g = 15 g. Add together all of the amounts to find the total grams of carbohydrates eaten: 30 g + 15 g + 15 g + 15 g = 75 g of carbohydrates total. What are tips for following this plan? Shopping Develop a meal plan and then make a shopping list. Buy fresh and frozen vegetables, fresh and frozen fruit, dairy, eggs, beans, lentils, and whole grains. Look at food labels. Choose foods that have more fiber and less sugar. Avoid processed foods and foods with added sugars. Meal planning Aim to have the same amount of carbohydrates at each meal and for each snack time. Plan to have regular, balanced meals and snacks. Where to find more information American Diabetes Association: www.diabetes.org Centers for Disease Control and Prevention: http://www.wolf.info/ Summary Carbohydrate counting is a method of keeping track of how many carbohydrates you eat. Eating carbohydrates naturally increases the amount of sugar (glucose) in the blood. Counting how many carbohydrates you eat improves your blood glucose control, which helps you manage your diabetes. A dietitian can help you make a meal plan and calculate how many carbohydrates you should have at each meal and snack. This information is not intended to replace advice given to you by your health care provider. Make sure you discuss any questions you have with your health care provider. Document Revised: 02/27/2019 Document Reviewed: 02/28/2019 Elsevier Patient Education  2021 [place]. Diabetes Mellitus and Nutrition, Adult When you have diabetes, or diabetes mellitus, it is very important to have healthy eating habits because your blood sugar (glucose) levels are greatly affected by what you eat and drink. Eating healthy foods in the right amounts, at about the same times every day, can help you: Control your blood glucose. Lower your  risk of heart disease. Improve your blood pressure. Reach or maintain a healthy weight. What  can affect my meal plan? Every person with diabetes is different, and each person has different needs for a meal plan. Your health care provider may recommend that you work with a dietitian to make a meal plan that is best for you. Your meal plan may vary depending on factors such as: The calories you need. The medicines you take. Your weight. Your blood glucose, blood pressure, and cholesterol levels. Your activity level. Other health conditions you have, such as heart or kidney disease. How do carbohydrates affect me? Carbohydrates, also called carbs, affect your blood glucose level more than any other type of food. Eating carbs naturally raises the amount of glucose in your blood. Carb counting is a method for keeping track of how many carbs you eat. Counting carbs is important to keep your blood glucose at a healthy level, especially if you use insulin or take certain oral diabetes medicines. It is important to know how many carbs you can safely have in each meal. This is different for every person. Your dietitian can help you calculate how many carbs you should have at each meal and for each snack. How does alcohol affect me? Alcohol can cause a sudden decrease in blood glucose (hypoglycemia), especially if you use insulin or take certain oral diabetes medicines. Hypoglycemia can be a life-threatening condition. Symptoms of hypoglycemia, such as sleepiness, dizziness, and confusion, are similar to symptoms of having too much alcohol. Do not drink alcohol if: Your health care provider tells you not to drink. You are pregnant, may be pregnant, or are planning to become pregnant. If you drink alcohol: Do not drink on an empty stomach. Limit how much you use to: 0-1 drink a day for women. 0-2 drinks a day for men. Be aware of how much alcohol is in your drink. In the [place] one drink equals one 12 oz  bottle of beer (355 mL), one 5 oz glass of wine (148 mL), or one 1 oz glass of hard liquor (44 mL). Keep yourself hydrated with water, diet soda, or unsweetened iced tea. Keep in mind that regular soda, juice, and other mixers may contain a lot of sugar and must be counted as carbs. What are tips for following this plan? Reading food labels Start by checking the serving size on the "Nutrition Facts" label of packaged foods and drinks. The amount of calories, carbs, fats, and other nutrients listed on the label is based on one serving of the item. Many items contain more than one serving per package. Check the total grams (g) of carbs in one serving. You can calculate the number of servings of carbs in one serving by dividing the total carbs by 15. For example, if a food has 30 g of total carbs per serving, it would be equal to 2 servings of carbs. Check the number of grams (g) of saturated fats and trans fats in one serving. Choose foods that have a low amount or none of these fats. Check the number of milligrams (mg) of salt (sodium) in one serving. Most people should limit total sodium intake to less than 2,300 mg per day. Always check the nutrition information of foods labeled as "low-fat" or "nonfat." These foods may be higher in added sugar or refined carbs and should be avoided. Talk to your dietitian to identify your daily goals for nutrients listed on the label. Shopping Avoid buying canned, pre-made, or processed foods. These foods tend to be high in fat, sodium, and added sugar. Shop around  the outside edge of the grocery store. This is where you will most often find fresh fruits and vegetables, bulk grains, fresh meats, and fresh dairy. Cooking Use low-heat cooking methods, such as baking, instead of high-heat cooking methods like deep frying. Cook using healthy oils, such as olive, canola, or sunflower oil. Avoid cooking with butter, cream, or high-fat meats. Meal planning Eat meals  and snacks regularly, preferably at the same times every day. Avoid going long periods of time without eating. Eat foods that are high in fiber, such as fresh fruits, vegetables, beans, and whole grains. Talk with your dietitian about how many servings of carbs you can eat at each meal. Eat 4-6 oz (112-168 g) of lean protein each day, such as lean meat, chicken, fish, eggs, or tofu. One ounce (oz) of lean protein is equal to: 1 oz (28 g) of meat, chicken, or fish. 1 egg.  cup (62 g) of tofu. Eat some foods each day that contain healthy fats, such as avocado, nuts, seeds, and fish. What foods should I eat? Fruits Berries. Apples. Oranges. Peaches. Apricots. Plums. Grapes. Mango. Papaya. Pomegranate. Kiwi. Cherries. Vegetables Lettuce. Spinach. Leafy greens, including kale, chard, collard greens, and mustard greens. Beets. Cauliflower. Cabbage. Broccoli. Carrots. Green beans. Tomatoes. Peppers. Onions. Cucumbers. Brussels sprouts. Grains Whole grains, such as whole-wheat or whole-grain bread, crackers, tortillas, cereal, and pasta. Unsweetened oatmeal. Quinoa. Brown or wild rice. Meats and other proteins Seafood. Poultry without skin. Lean cuts of poultry and beef. Tofu. Nuts. Seeds. Dairy Low-fat or fat-free dairy products such as milk, yogurt, and cheese. The items listed above may not be a complete list of foods and beverages you can eat. Contact a dietitian for more information. What foods should I avoid? Fruits Fruits canned with syrup. Vegetables Canned vegetables. Frozen vegetables with butter or cream sauce. Grains Refined white flour and flour products such as bread, pasta, snack foods, and cereals. Avoid all processed foods. Meats and other proteins Fatty cuts of meat. Poultry with skin. Breaded or fried meats. Processed meat. Avoid saturated fats. Dairy Full-fat yogurt, cheese, or milk. Beverages Sweetened drinks, such as soda or iced tea. The items listed above may not be a  complete list of foods and beverages you should avoid. Contact a dietitian for more information. Questions to ask a health care provider Do I need to meet with a diabetes educator? Do I need to meet with a dietitian? What number can I call if I have questions? When are the best times to check my blood glucose? Where to find more information: American Diabetes Association: diabetes.org Academy of Nutrition and Dietetics: www.eatright.[employer] of Diabetes and Digestive and Kidney Diseases: [place] Association of Diabetes Care and Education Specialists: www.diabeteseducator.org Summary It is important to have healthy eating habits because your blood sugar (glucose) levels are greatly affected by what you eat and drink. A healthy meal plan will help you control your blood glucose and maintain a healthy lifestyle. Your health care provider may recommend that you work with a dietitian to make a meal plan that is best for you. Keep in mind that carbohydrates (carbs) and alcohol have immediate effects on your blood glucose levels. It is important to count carbs and to use alcohol carefully. This information is not intended to replace advice given to you by your health care provider. Make sure you discuss any questions you have with your health care provider. Document Revised: 02/04/2019 Document Reviewed: 02/04/2019 Elsevier Patient Education  2021 [employer].

## 2020-12-30 ENCOUNTER — Other Ambulatory Visit: Payer: Self-pay

## 2020-12-30 ENCOUNTER — Encounter (INDEPENDENT_AMBULATORY_CARE_PROVIDER_SITE_OTHER): Payer: Medicare Other | Admitting: Ophthalmology

## 2020-12-30 ENCOUNTER — Ambulatory Visit (INDEPENDENT_AMBULATORY_CARE_PROVIDER_SITE_OTHER): Payer: Medicare Other

## 2020-12-30 DIAGNOSIS — H43813 Vitreous degeneration, bilateral: Secondary | ICD-10-CM | POA: Diagnosis not present

## 2020-12-30 DIAGNOSIS — H35371 Puckering of macula, right eye: Secondary | ICD-10-CM

## 2020-12-30 DIAGNOSIS — Z23 Encounter for immunization: Secondary | ICD-10-CM

## 2020-12-30 DIAGNOSIS — H2513 Age-related nuclear cataract, bilateral: Secondary | ICD-10-CM | POA: Diagnosis not present

## 2021-01-06 ENCOUNTER — Other Ambulatory Visit: Payer: Self-pay | Admitting: Family Medicine

## 2021-01-06 NOTE — Telephone Encounter (Signed)
Requested medications are due for refill today yes  Requested medications are on the active medication list yes  Last refill 12/07/20  Last visit last addressed 08/19/20  Future visit scheduled 02/22/21  Notes to clinic had a curtesy refill, saw an LPN 16/57/90, no upcoming appt, failed protocol due to labs greater than 360 days ago, please assess.  Requested Prescriptions  Pending Prescriptions Disp Refills   SYNTHROID 150 MCG tablet [Pharmacy Med Name: SYNTHROID 150 MCG TAB] 30 tablet 0    Sig: TAKE ONE TABLET ON AN EMPTY STOMACH WITHA GLASS OF WATER AT LEAST 30 TO 68 MINUTES BEFORE BREAKFAST     Endocrinology:  Hypothyroid Agents Failed - 01/06/2021  9:54 AM      Failed - TSH needs to be rechecked within 3 months after an abnormal result. Refill until TSH is due.      Failed - TSH in normal range and within 360 days    TSH  Date Value Ref Range Status  11/11/2019 0.865 0.450 - 4.500 uIU/mL Final          Passed - Valid encounter within last 12 months    Recent Outpatient Visits           2 months ago Acute cystitis with hematuria   [place], Megan P, DO   3 months ago Acute left-sided thoracic back pain   [place], Megan P, DO   4 months ago Acute cystitis without hematuria   [facility], Megan P, DO   8 months ago Type 2 diabetes mellitus with stage 3 chronic kidney disease, with long-term current use of insulin, unspecified whether stage 3a or 3b CKD ([place])   [place], Megan P, DO   11 months ago Type 2 diabetes mellitus with stage 3 chronic kidney disease, with long-term current use of insulin, unspecified whether stage 3a or 3b CKD ([place])   [place], Megan P, DO       Future Appointments             In 11 months [place], [facility]

## 2021-01-10 DIAGNOSIS — E1122 Type 2 diabetes mellitus with diabetic chronic kidney disease: Secondary | ICD-10-CM | POA: Diagnosis not present

## 2021-01-10 DIAGNOSIS — Z794 Long term (current) use of insulin: Secondary | ICD-10-CM

## 2021-01-10 DIAGNOSIS — I10 Essential (primary) hypertension: Secondary | ICD-10-CM

## 2021-01-10 DIAGNOSIS — E1165 Type 2 diabetes mellitus with hyperglycemia: Secondary | ICD-10-CM

## 2021-01-10 DIAGNOSIS — J449 Chronic obstructive pulmonary disease, unspecified: Secondary | ICD-10-CM | POA: Diagnosis not present

## 2021-01-10 DIAGNOSIS — N183 Chronic kidney disease, stage 3 unspecified: Secondary | ICD-10-CM

## 2021-01-12 ENCOUNTER — Telehealth: Payer: Self-pay | Admitting: Family Medicine

## 2021-01-12 NOTE — Telephone Encounter (Signed)
[facility] called and stated that the RX for PROAIR HFA 108 (90 Base) MCG/ACT inhaler has refills but the company has stopped making this and they need a new RX for Proair Quick plus or ventolin / please advise

## 2021-01-12 NOTE — Telephone Encounter (Signed)
Routing to provider for approval. RX started.

## 2021-01-13 MED ORDER — ALBUTEROL SULFATE HFA 108 (90 BASE) MCG/ACT IN AERS: 2.0000 | INHALATION_SPRAY | Freq: Four times a day (QID) | RESPIRATORY_TRACT | 6 refills | Status: DC | PRN

## 2021-01-14 DIAGNOSIS — N3020 Other chronic cystitis without hematuria: Secondary | ICD-10-CM | POA: Diagnosis not present

## 2021-01-14 DIAGNOSIS — Q6211 Congenital occlusion of ureteropelvic junction: Secondary | ICD-10-CM | POA: Diagnosis not present

## 2021-01-17 ENCOUNTER — Encounter: Payer: Self-pay | Admitting: Internal Medicine

## 2021-01-17 ENCOUNTER — Inpatient Hospital Stay (HOSPITAL_BASED_OUTPATIENT_CLINIC_OR_DEPARTMENT_OTHER): Payer: Medicare Other | Admitting: Internal Medicine

## 2021-01-17 ENCOUNTER — Other Ambulatory Visit: Payer: Self-pay

## 2021-01-17 ENCOUNTER — Other Ambulatory Visit: Payer: Self-pay | Admitting: *Deleted

## 2021-01-17 ENCOUNTER — Inpatient Hospital Stay: Payer: Medicare Other | Attending: Internal Medicine

## 2021-01-17 DIAGNOSIS — Z170 Estrogen receptor positive status [ER+]: Secondary | ICD-10-CM

## 2021-01-17 DIAGNOSIS — Z79899 Other long term (current) drug therapy: Secondary | ICD-10-CM | POA: Diagnosis not present

## 2021-01-17 DIAGNOSIS — C50312 Malignant neoplasm of lower-inner quadrant of left female breast: Secondary | ICD-10-CM | POA: Diagnosis not present

## 2021-01-17 DIAGNOSIS — Z87891 Personal history of nicotine dependence: Secondary | ICD-10-CM | POA: Diagnosis not present

## 2021-01-17 DIAGNOSIS — Z79811 Long term (current) use of aromatase inhibitors: Secondary | ICD-10-CM | POA: Diagnosis not present

## 2021-01-17 DIAGNOSIS — N183 Chronic kidney disease, stage 3 unspecified: Secondary | ICD-10-CM | POA: Insufficient documentation

## 2021-01-17 LAB — CBC WITH DIFFERENTIAL/PLATELET
Abs Immature Granulocytes: 0.08 10*3/uL — ABNORMAL HIGH (ref 0.00–0.07)
Basophils Absolute: 0.1 10*3/uL (ref 0.0–0.1)
Basophils Relative: 1 %
Eosinophils Absolute: 0.1 10*3/uL (ref 0.0–0.5)
Eosinophils Relative: 2 %
HCT: 43.1 % (ref 36.0–46.0)
Hemoglobin: 13.3 g/dL (ref 12.0–15.0)
Immature Granulocytes: 1 %
Lymphocytes Relative: 26 %
Lymphs Abs: 2.1 10*3/uL (ref 0.7–4.0)
MCH: 27.7 pg (ref 26.0–34.0)
MCHC: 30.9 g/dL (ref 30.0–36.0)
MCV: 89.6 fL (ref 80.0–100.0)
Monocytes Absolute: 1 10*3/uL (ref 0.1–1.0)
Monocytes Relative: 13 %
Neutro Abs: 4.8 10*3/uL (ref 1.7–7.7)
Neutrophils Relative %: 57 %
Platelets: 312 10*3/uL (ref 150–400)
RBC: 4.81 MIL/uL (ref 3.87–5.11)
RDW: 14.2 % (ref 11.5–15.5)
WBC: 8.2 10*3/uL (ref 4.0–10.5)
nRBC: 0 % (ref 0.0–0.2)

## 2021-01-17 LAB — BASIC METABOLIC PANEL
Anion gap: 10 (ref 5–15)
BUN: 33 mg/dL — ABNORMAL HIGH (ref 8–23)
CO2: 26 mmol/L (ref 22–32)
Calcium: 9.2 mg/dL (ref 8.9–10.3)
Chloride: 100 mmol/L (ref 98–111)
Creatinine, Ser: 1.77 mg/dL — ABNORMAL HIGH (ref 0.44–1.00)
GFR, Estimated: 29 mL/min — ABNORMAL LOW (ref >60)
Glucose, Bld: 197 mg/dL — ABNORMAL HIGH (ref 70–99)
Potassium: 5 mmol/L (ref 3.5–5.1)
Sodium: 136 mmol/L (ref 135–145)

## 2021-01-17 NOTE — Progress Notes (Signed)
[facility] [place] NOTE  Patient Care Team: [name], DO as PCP - General (Family Medicine) [name], [name], MD as PCP - Cardiology (Cardiology) [name], MD as Consulting Physician (Cardiology) [name], MD as Consulting Physician (Urology) [name], MD as Consulting Physician (Nephrology) [name], MD as Referring Physician (Orthopedic Surgery) [name], MD as Attending Physician (Endocrinology) [name], MD as Referring Physician (Vascular Surgery) [name], [facility] as Pharmacist (Pharmacist) [name], RN as Case Manager (General Practice)  CHIEF COMPLAINTS/PURPOSE OF CONSULTATION: Breast cancer  #  Oncology History Overview Note   # July 2020 Left Breast- IMC; 14 mm; Grade: 2; Lymphovascular Invasion: Not identified;  pT1c pN0(sn); ER positive, PR positive, HER2 negative. [s/p lumpec; Dr.Byrnett]; rs/p RT [finished Sep, 9th 2020]  # Sep end 2020- START Arimidexa  # JAN 2022- 78m/left upper lobe lung nodule; PET positive; right upper lobe pneumonia/suspected reactive adenopathy. STAGE I Lung ca [suspected based on imaging no biopsy]- SBRT Dr.Crystal [May [id number]] # CKD-III/COPD/ CHF-compesated/PAD; CAD  DIAGNOSIS: Left breast cancer  STAGE: 1  ;GOALS: Cure  CURRENT/MOST RECENT THERAPY: arimidex   Malignant neoplasm of lower-inner quadrant of left female breast ([place]  08/14/2018 Initial Diagnosis   Malignant neoplasm of lower-inner quadrant of left female breast ([facility]   Carcinoma of lower-inner quadrant of left breast in female, estrogen receptor positive ([place]  10/02/2018 Initial Diagnosis   Carcinoma of lower-inner quadrant of left breast in female, estrogen receptor positive ([place]      HISTORY OF PRESENTING ILLNESS:In a motorized scooter.  Accompanied by her husband. Tammy [id number].[id number].  female to history of stage I ER PR positive/HER-2 negative breast cancer ; and also stage I presumed  left upper lobe lung cancer [NO bX]-s/p SBRT is here for follow-up/review results of the CT scan  Patient admits to mild shortness of breath-given the inability to take deep breaths because of chronic back pain.  Denies any worsening cough.  Chronic back pain is chronic fatigue.  Denies any new lumps or bumps.  Admits to compliance with Arimidex.  Complains of hot flashes.  Review of Systems  Constitutional:  Negative for chills, diaphoresis, fever, malaise/fatigue and weight loss.  HENT:  Negative for nosebleeds and sore throat.   Eyes:  Negative for double vision.  Respiratory:  Negative for cough, hemoptysis, sputum production, shortness of breath and wheezing.   Cardiovascular:  Negative for chest pain, palpitations, orthopnea and leg swelling.  Gastrointestinal:  Negative for abdominal pain, blood in stool, constipation, diarrhea, heartburn, melena, nausea and vomiting.  Genitourinary:  Negative for dysuria, frequency and urgency.  Musculoskeletal:  Positive for back pain and joint pain.  Skin: Negative.  Negative for itching and rash.  Neurological:  Negative for dizziness, tingling, focal weakness, weakness and headaches.  Endo/Heme/Allergies:  Does not bruise/bleed easily.  Psychiatric/Behavioral:  Negative for depression. The patient is not nervous/anxious and does not have insomnia.     MEDICAL HISTORY:  Past Medical History:  Diagnosis Date   Asthma    Benign neoplasm of colon    Breast cancer ([place] 08/[place]   left breast   Cervical disc disease    "bulging" - no limitations per pt   Chronic diastolic CHF (congestive heart failure) ([place]    a. 07/2012 Echo: Nl EF. mild inferoseptal HK, Gr 2 DD, mild conc LVH, mild PR/TR, mild PAH; b. 08/2019 Echo: EF 55-60%, no rwma, Gr1 DD. Nl RV  size/fxn.   CKD (chronic kidney disease), stage III (HCC)    COPD (chronic obstructive pulmonary disease) (HCC)    Coronary artery disease    a. 11/2011 Cath: LAD 32md, LCX min irregs, RCA 70p,  1039mith L->R collats, EF 60%-->Med Rx; b. 03/2019 Cath: LM nl, LAD 40-5045mCX large, mild diff dzs. OM1/2 nl. RCA known to be 100m64m->R collats from LAD & LCX/OM.   Diabetes mellitus without complication (HCC)    Fusion of lumbar spine 12/22/2015   GERD (gastroesophageal reflux disease)    History of DVT (deep vein thrombosis)    History of tobacco abuse    a. Quit 2011.   Hyperlipidemia    Hypertension    Hypothyroidism    Lung cancer (HCC)[place] Obesity    Palpitations    Personal history of radiation therapy    PONV (postoperative nausea and vomiting)    Pulmonary embolus (HCC)[place]/15/2015   RLL. Presented with SOB and elevated d-dimer.    PVD (peripheral vascular disease) (HCC)[place] a. PTA of right leg and left femoral artery with stenosis; b. 09/2020 ABI: R 0.93, L 0.88 - unchanged from prior study.   Stroke (HCC)[place]15   TIAs - no deficits    SURGICAL HISTORY: Past Surgical History:  Procedure Laterality Date   ANGIOPLASTY / STENTING FEMORAL     PVD; angioplasty right leg and left femoral artery with stenosis.    APPENDECTOMY  12/2014   BACK SURGERY  03/2012   nerve stimulator inserted   BACK SURGERY     screws, rods replaced with new hardware.   BACK SURGERY  11/2016   BREAST LUMPECTOMY Left 08/26/2018   BREAST SURGERY     CARDIAC CATHETERIZATION  12/07/2011   Mid LAD 50%, distal LAD 50%, mid RCA 70%, distal RCA 100%    CARDIAC CATHETERIZATION  01/04/2011   100% occluded mid RCA with good collaterals from distal LAD, normal LVEF.   CHOLECYSTECTOMY     COLONOSCOPY  2013   COLONOSCOPY WITH PROPOFOL N/A 05/24/2015   Procedure: COLONOSCOPY WITH PROPOFOL;  Surgeon: DarrLucilla Lame;  [place] [place];  Laterality: N/A;  Diabetic - oral meds   COLONOSCOPY WITH PROPOFOL N/A 01/28/2019   Procedure: COLONOSCOPY WITH PROPOFOL;  Surgeon: TahiVirgel Manifold;  Location: [facility] ENDOSCOPY;  Service: Endoscopy;  Laterality: N/A;   DIAGNOSTIC  LAPAROSCOPY     ESOPHAGOGASTRODUODENOSCOPY (EGD) WITH PROPOFOL N/A 01/28/2019   Procedure: ESOPHAGOGASTRODUODENOSCOPY (EGD) WITH PROPOFOL;  Surgeon: TahiVirgel Manifold;  Location: [facility] ENDOSCOPY;  Service: Endoscopy;  Laterality: N/A;   LAPAROSCOPIC APPENDECTOMY N/A 01/06/2015   Procedure: APPENDECTOMY LAPAROSCOPIC;  Surgeon: SeepChristene Lye;  Location: [facility] ORS;  Service: General;  Laterality: N/A;   LEFT HEART CATH AND CORONARY ANGIOGRAPHY Left 04/04/2019   Procedure: LEFT HEART CATH AND CORONARY ANGIOGRAPHY;  Surgeon: GollMinna Merritts;  [facility];  Service: Cardiovascular;  Laterality: Left;   MASTECTOMY W/ SENTINEL NODE BIOPSY Left 08/26/2018   Procedure: PARTIAL MASTECTOMY WIDE EXCISION WITH SENTINEL LYMPH NODE BIOPSY LEFT;  Surgeon: ByrnRobert Bellow;  Location: [facility] ORS;  Service: General;  Laterality: Left;   POLYPECTOMY  05/24/2015   Procedure: POLYPECTOMY;  Surgeon: DarrLucilla Lame;  [place] [place];;   REPLACEMENT TOTAL KNEE Right    THYROIDECTOMY     TOTAL KNEE ARTHROPLASTY Right 11/13/2019   TRIGGER FINGER RELEASE  SOCIAL HISTORY: Social History   Socioeconomic History   Marital status: Married    Spouse name: Not on file   Number of children: 2   Years of education: some college, Audiological scientist for interior design    Highest education level: Associate degree: occupational, Hotel manager, or vocational program  Occupational History   Occupation: Retired  Tobacco Use   Smoking status: Former    Packs/day: 0.50    Years: 15.00    Pack years: 7.50    Types: Cigarettes    Quit date: 03/14/2007    Years since quitting: 13.8   Smokeless tobacco: Never  Vaping Use   Vaping Use: Never used  Substance and Sexual Activity   Alcohol use: No    Alcohol/week: 0.0 standard drinks   Drug use: Never   Sexual activity: Not Currently    Birth control/protection: Post-menopausal  Other Topics  Concern   Not on file  Social History Narrative   Lives at home with [place] in [place]. Quit smoking 10 years ago; never alcohol. 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    Social Determinants of Health   Financial Resource Strain: Low Risk    Difficulty of Paying Living Expenses: Not hard at all  Food Insecurity: No Food Insecurity   Worried About Charity fundraiser in the Last Year: Never true   [place] in the Last Year: Never true  Transportation Needs: No Transportation Needs   Lack of Transportation (Medical): No   Lack of Transportation (Non-Medical): No  Physical Activity: Inactive   Days of Exercise per Week: 0 days   Minutes of Exercise per Session: 0 min  Stress: No Stress Concern Present   Feeling of Stress : Not at all  Social Connections: Socially Integrated   Frequency of Communication with Friends and Family: More than three times a week   Frequency of Social Gatherings with Friends and Family: More than three times a week   Attends Religious Services: More than 4 times per year   Active Member of [employer] or Organizations: Yes   Attends Music therapist: 1 to 4 times per year   Marital Status: Married  Human resources officer Violence: Not At Risk   Fear of Current or Ex-Partner: No   Emotionally Abused: No   Physically Abused: No   Sexually Abused: No    FAMILY HISTORY: Family History  Problem Relation Age of Onset   Cancer Mother        colon   Hypertension Father    Heart disease Father    Heart attack Father    Diabetes Father    Cancer Sister        lung   COPD Sister    COPD Sister    Heart attack Brother    Diabetes Brother    Hypertension Brother    Heart disease Brother    Heart attack Brother    Diabetes Brother    Hypertension Brother    Heart disease Brother    Heart attack Brother    Diabetes Brother    Hypertension Brother    Heart disease Brother    Colon cancer Brother 9    Breast cancer Neg Hx     ALLERGIES:  is allergic to hydrocodone, aleve [naproxen sodium], contrast media [iodinated diagnostic agents], ioxaglate, oxycodone, ranexa [ranolazine], sulfa antibiotics, and tramadol.  MEDICATIONS:  Current Outpatient Medications  Medication Sig Dispense Refill  albuterol (VENTOLIN HFA) 108 (90 Base) MCG/ACT inhaler Inhale 2 puffs into the lungs every 6 (six) hours as needed for wheezing or shortness of breath. 8 g 6   anastrozole (ARIMIDEX) 1 MG tablet Take 1 tablet (1 mg total) by mouth daily. 90 tablet 1   apixaban (ELIQUIS) 2.5 MG TABS tablet Take 1 tablet (2.5 mg total) by mouth 2 (two) times daily. 180 tablet 1   blood glucose meter kit and supplies KIT Dispense based on patient and insurance preference. Use one time daily as directed, E11.9 1 each 0   Calcium Carb-Cholecalciferol (CALCIUM 600 + D) 600-200 MG-UNIT TABS Take 1 tablet by mouth daily.      cetirizine (ZYRTEC) 10 MG tablet TAKE 1 TABLET BY MOUTH DAILY 30 tablet 0   cholecalciferol (VITAMIN D3) 25 MCG (1000 UT) tablet Take 1,000 Units by mouth daily.     ciprofloxacin (CIPRO) 500 MG tablet Take 1 tablet (500 mg total) by mouth 2 (two) times daily. (Patient not taking: No sig reported) 14 tablet 0   cyanocobalamin (,VITAMIN B-12,) 1000 MCG/ML injection Inject 1,000 mcg into the muscle once a week. Then once a month x 4 months (Patient not taking: No sig reported)     DULoxetine (CYMBALTA) 60 MG capsule Take 2 capsules (120 mg total) by mouth daily. 180 capsule 1   empagliflozin (JARDIANCE) 25 MG TABS tablet Take 1 tablet (25 mg total) by mouth daily. TAKE ONE TABLET 25MG EVERY DAY BEFORE BREAKFAST 90 tablet 1   gabapentin (NEURONTIN) 300 MG capsule Take 1 capsule (300 mg total) by mouth 3 (three) times daily. 270 capsule 3   glipiZIDE (GLUCOTROL) 5 MG tablet TAKE ONE TABLET BY MOUTH EVERY DAY BEFORE BREAKFAST 90 tablet 1   Insulin Pen Needle (PEN NEEDLES 3/16") 31G X 5 MM MISC 1 each by Does not  apply route daily. 100 each 12   isosorbide mononitrate (IMDUR) 60 MG 24 hr tablet TAKE ONE TABLET BY MOUTH AT BEDTIME AND HALF A TABLET EVERY MORNING (Patient taking differently: TAKE ONE TABLET BY MOUTH AT BEDTIME) 135 tablet 1   Lactobacillus (PROBIOTIC ACIDOPHILUS PO) Take 1 capsule by mouth daily at 12 noon.     metoprolol succinate (TOPROL-XL) 50 MG 24 hr tablet TAKE 1 TABLET BY MOUTH DAILY WITH OR IMMEDIATLEY FOLLOWING A MEAL 90 tablet 1   montelukast (SINGULAIR) 10 MG tablet Take 1 tablet (10 mg total) by mouth at bedtime. 90 tablet 1   MYRBETRIQ 50 MG TB24 tablet TAKE 1 TABLET BY MOUTH DAILY 30 tablet 2   nitroGLYCERIN (NITROSTAT) 0.4 MG SL tablet Place 1 tablet (0.4 mg total) under the tongue every 5 (five) minutes as needed for chest pain. Total of 3 doses 25 tablet 6   pantoprazole (PROTONIX) 40 MG tablet TAKE 1 TABLET BY MOUTH ONCE DAILY 90 tablet 1   Pumpkin Seed-Soy Germ (AZO BLADDER CONTROL/GO-LESS PO) Take 1 tablet by mouth in the morning and at bedtime.     rosuvastatin (CRESTOR) 40 MG tablet Take 1 tablet (40 mg total) by mouth daily. 90 tablet 1   senna (SENOKOT) 8.6 MG TABS tablet Take 8.6 mg by mouth daily as needed for mild constipation.     SYNTHROID 150 MCG tablet TAKE ONE TABLET ON AN EMPTY STOMACH WITHA GLASS OF WATER AT LEAST 30 TO 60 MINUTES BEFORE BREAKFAST 30 tablet 0   TRESIBA FLEXTOUCH 100 UNIT/ML FlexTouch Pen INJECT 24 UNITS SUBCUTANEOUSLY DAILY 15 mL 0   trimethoprim (TRIMPEX) 100 MG  tablet TAKE ONE TABLET BY MOUTH EVERY DAY 90 tablet 1   TRULICITY 4.5 DJ/5.7SV SOPN INJECT 4.5MG ONCE WEEKLY AS DIRECTED 6 mL 3   No current facility-administered medications for this visit.      [name]  PHYSICAL EXAMINATION: ECOG PERFORMANCE STATUS: 1 - Symptomatic but completely ambulatory  Vitals:   01/17/21 1444  BP: 133/72  Pulse: 79  Resp: 20  Temp: 98.6 F (37 C)   Filed Weights   01/17/21 1444  Weight: 217 lb (98.4 kg)    Physical Exam Constitutional:       Comments: Patient is ambulating with a cane.  She is accompanied by her husband.  HENT:     Head: Normocephalic and atraumatic.     Mouth/Throat:     Pharynx: No oropharyngeal exudate.  Eyes:     Pupils: Pupils are equal, round, and reactive to light.  Cardiovascular:     Rate and Rhythm: Normal rate and regular rhythm.  Pulmonary:     Effort: Pulmonary effort is normal. No respiratory distress.     Breath sounds: Normal breath sounds. No wheezing.  Abdominal:     General: Bowel sounds are normal. There is no distension.     Palpations: Abdomen is soft. There is no mass.     Tenderness: There is no abdominal tenderness. There is no guarding or rebound.  Musculoskeletal:        General: No tenderness. Normal range of motion.     Cervical back: Normal range of motion and neck supple.  Skin:    General: Skin is warm.     Comments: Right and left BREAST exam [in the presence of nurse]- no unusual skin changes or dominant masses felt. Surgical scars noted.    Neurological:     Mental Status: She is alert and oriented to person, place, and time.  Psychiatric:        Mood and Affect: Affect normal.   LABORATORY DATA:  I have reviewed the data as listed Lab Results  Component Value Date   WBC 8.2 01/17/2021   HGB 13.3 01/17/2021   HCT 43.1 01/17/2021   MCV 89.6 01/17/2021   PLT 312 01/17/2021   Recent Labs    02/10/20 1440 04/17/20 0340 04/17/20 0450 04/18/20 0623 05/11/20 1504 07/19/20 1019 08/19/20 1015 09/30/20 1449 10/18/20 1418 10/28/20 1011 11/02/20 1510 11/09/20 1130  NA 144   < >  --  133*   < > 136 142 145* 137 143 140 142  K 5.2   < >  --  4.3   < > 4.9 5.2 5.6* 5.1 5.9* 5.3* 4.8  CL 106   < >  --  104   < > 104 103 105 102 106 103 103  CO2 23   < >  --  20*   < > _0 19* 23  GLUCOSE 84   < >  --  127*   < > 173* 158* 158* 234* 176* 158* 185*  BUN 23   < >  --  16   < > 34* 28* 24 31* 28* 30* 26  CREATININE 1.75*   < >  --  1.57*   < > 1.73*  1.73* 1.54* 1.59* 1.66* 1.62* 1.66*  CALCIUM 9.4   < >  --  8.1*   < > 9.0 9.5 9.0 8.8* 10.0 8.6* 8.9  GFRNONAA 28*   < >  --  34*  --  30*  --   --  33*  --   --   --   GFRAA 32*  --   --   --   --   --   --   --   --   --   --   --   PROT 7.2  --  6.8  --    < > 7.0 6.8 6.7  --  7.2  --   --   ALBUMIN 4.3  --  3.3*  --    < > 3.6 4.3 4.3  --  4.5  --   --   AST 15  --  15  --    < > 14* 17 13  --  14  --   --   ALT 11  --  11  --    < > _0 --  10  --   --   ALKPHOS 84  --  68  --    < > 75 113 104  --  92  --   --   BILITOT 0.3  --  0.8  --    < > 0.8 0.4 0.3  --  0.4  --   --   BILIDIR  --   --  0.1  --   --   --   --   --   --   --   --   --   IBILI  --   --  0.7  --   --   --   --   --   --   --   --   --    < > = values in this interval not displayed.    RADIOGRAPHIC STUDIES: I have personally reviewed the radiological images as listed and agreed with the findings in the report. No results found.   ASSESSMENT & PLAN:   Carcinoma of lower-inner quadrant of left breast in female, estrogen receptor positive ([place]) #Left upper lobe lung nodule 6 mm MAY 2021-slow-growing currently 9 mm May 2022 CT scan.  Difficulty biopsy-peripheral/subpleural.  S/p SBRT [June 2022]; awaitimg CT scan in October- improved; STABLE; awaiting repeat imaging in  [place] [rad onc]  # Left breast stage I breast cancer ER PR positive HER-2 negative; status post lumpectomy.  On arimidex. Mammogram-May 2022- left mammo-WNL; tolerating well except for hot flashes see below  # Hot flashes -grade 2-discussed regarding the mechanism of action of anastrozole leading to hot flashes.  Discussed regarding Effexor patient declines.  #Slightly worse difficulty breathing-October 2022- ?  Progressive bilateral lower lobe lung minimal interstitial pneumonitis vs other causes.  Defer to Dr. Patsey Berthold.  # BMD- FEB 2021- T score;  - 1.5.on ca+vit D-STABLE.   # CKD- III- creat 1.5; GFR-33.[NOV 2021-]; S stable.-  Dr.Kolluru/follow-up with PCP.  # DISPOSITION:  #  Follow-up in mid april;MD;  labs-CBC BMP-Dr.B    All questions were answered. The patient/family knows to call the clinic with any problems, questions or concerns.       Cammie Sickle, MD 01/17/2021 3:38 PM

## 2021-01-17 NOTE — Assessment & Plan Note (Addendum)
#  Left upper lobe lung nodule 6 mm MAY 2021-slow-growing currently 9 mm May 2022 CT scan.  Difficulty biopsy-peripheral/subpleural.  S/p SBRT [June 2022]; awaitimg CT scan in October- improved; STABLE; awaiting repeat imaging in  [place] [rad onc]  # Left breast stage I breast cancer ER PR positive HER-2 negative; status post lumpectomy.  On arimidex. Mammogram-May 2022- left mammo-WNL; tolerating well except for hot flashes see below  # Hot flashes -grade 2-discussed regarding the mechanism of action of anastrozole leading to hot flashes.  Discussed regarding Effexor patient declines.  #Slightly worse difficulty breathing-October 2022- ?  Progressive bilateral lower lobe lung minimal interstitial pneumonitis vs other causes.  Defer to Dr. Patsey Berthold.  # BMD- FEB 2021- T score;  - 1.5.on ca+vit D-STABLE.   # CKD- III- creat 1.5; GFR-33.[NOV 2021-]; S stable.- Dr.Kolluru/follow-up with PCP.  # DISPOSITION:  #  Follow-up in mid april;MD;  labs-CBC BMP-Dr.B

## 2021-01-17 NOTE — Progress Notes (Signed)
Pcp treating her for a UTI. She c/o of fatigue. Patient's brother dx with colon cancer. He had surgery today. Pt would like to discuss genetic testing with Dr.B. pt is overdue for her colonoscopy. Personal H/o colon polyps.

## 2021-01-29 ENCOUNTER — Other Ambulatory Visit: Payer: Self-pay | Admitting: Physician Assistant

## 2021-01-29 DIAGNOSIS — N3281 Overactive bladder: Secondary | ICD-10-CM

## 2021-02-02 ENCOUNTER — Other Ambulatory Visit: Payer: Self-pay | Admitting: Family Medicine

## 2021-02-02 NOTE — Telephone Encounter (Signed)
Requested Prescriptions  Pending Prescriptions Disp Refills  . montelukast (SINGULAIR) 10 MG tablet [Pharmacy Med Name: MONTELUKAST SODIUM 10 MG TAB] 90 tablet 1    Sig: TAKE 1 TABLET BY MOUTH AT BEDTIME     Pulmonology:  Leukotriene Inhibitors Passed - 02/02/2021  9:22 AM      Passed - Valid encounter within last 12 months    Recent Outpatient Visits          3 months ago Acute cystitis with hematuria   [place], Megan P, DO   4 months ago Acute left-sided thoracic back pain   [place], Megan P, DO   5 months ago Acute cystitis without hematuria   [facility], Megan P, DO   8 months ago Type 2 diabetes mellitus with stage 3 chronic kidney disease, with long-term current use of insulin, unspecified whether stage 3a or 3b CKD ([place])   [place], Megan P, DO   11 months ago Type 2 diabetes mellitus with stage 3 chronic kidney disease, with long-term current use of insulin, unspecified whether stage 3a or 3b CKD ([place])   [place], Megan P, DO      Future Appointments            In 10 months [place], [facility]

## 2021-02-09 ENCOUNTER — Other Ambulatory Visit: Payer: Self-pay | Admitting: Family Medicine

## 2021-02-10 NOTE — Telephone Encounter (Signed)
Requested medications are due for refill today.  yes  Requested medications are on the active medications list.  yes  Last refill. 01/07/2021  Future visit scheduled.   Not with a provider.  Notes to clinic.  Failed protocol d/t expired labs.

## 2021-02-11 ENCOUNTER — Other Ambulatory Visit: Payer: Self-pay | Admitting: Family Medicine

## 2021-02-11 NOTE — Telephone Encounter (Signed)
Last TSH normal in 04/22

## 2021-02-11 NOTE — Telephone Encounter (Signed)
Medication Refill - Medication:   90 day supply of SYNTHROID 521 MCG tablet  TRULICITY 4.5 VG/7.1TN SOPN (samples)  Has the patient contacted their pharmacy? Yes.    (Agent: If yes, when and what did the pharmacy advise?) Pharmacy advised patient to call PCP because they haven't heard back from the request. Pharmacy also told her that Trulicty is on a national back order and advised her to get some samples from PCP  Preferred Pharmacy (with phone number or street name):  [place], [place] - [address]  Phone:  [phone] Fax:  [phone]  Has the patient been seen for an appointment in the last year OR does the patient have an upcoming appointment? Yes.     Agent: Please be advised that RX refills may take up to 3 business days. We ask that you follow-up with your pharmacy.

## 2021-02-12 NOTE — Telephone Encounter (Signed)
Called [facility] and spoke to [place] who stated pt has picked up the synthroid prescription.  Too soon to reorder Trulicity. Last RF 11/17/20 6 ml with 3 RF. Pt is requesting refills of Trulicity   Requested Prescriptions  Pending Prescriptions Disp Refills   SYNTHROID 150 MCG tablet 30 tablet      Endocrinology:  Hypothyroid Agents Failed - 02/11/2021 11:50 AM      Failed - TSH needs to be rechecked within 3 months after an abnormal result. Refill until TSH is due.      Failed - TSH in normal range and within 360 days    TSH  Date Value Ref Range Status  11/11/2019 0.865 0.450 - 4.500 uIU/mL Final          Passed - Valid encounter within last 12 months    Recent Outpatient Visits           3 months ago Acute cystitis with hematuria   [place], Megan P, DO   4 months ago Acute left-sided thoracic back pain   [place], Megan P, DO   5 months ago Acute cystitis without hematuria   [facility], Megan P, DO   9 months ago Type 2 diabetes mellitus with stage 3 chronic kidney disease, with long-term current use of insulin, unspecified whether stage 3a or 3b CKD ([place])   [place], Megan P, DO   1 year ago Type 2 diabetes mellitus with stage 3 chronic kidney disease, with long-term current use of insulin, unspecified whether stage 3a or 3b CKD ([place])   [facility] [place], [place], DO       Future Appointments             In 3 days [place], Megan P, DO [place], [facility]   In 10 months  [place], [facility]             Dulaglutide (TRULICITY) 4.5 AY/3.0ZS SOPN 6 mL     Sig: INJECT 4.5MG  ONCE WEEKLY AS DIRECTED     Endocrinology:  Diabetes - GLP-1 Receptor Agonists Passed - 02/11/2021 11:50 AM      Passed - HBA1C is between 0 and 7.9 and within 180 days    Hemoglobin A1C  Date Value Ref Range Status  01/31/2016 8.7  Final   HB A1C (BAYER DCA -  WAIVED)  Date Value Ref Range Status  08/19/2020 7.9 (H) <7.0 % Final    Comment:                                          Diabetic Adult            <7.0                                       Healthy Adult        4.3 - 5.7                                                           (DCCT/NGSP) American Diabetes Association's  Summary of Glycemic Recommendations for Adults with Diabetes: Hemoglobin A1c <7.0%. More stringent glycemic goals (A1c <6.0%) may further reduce complications at the cost of increased risk of hypoglycemia.           Passed - Valid encounter within last 6 months    Recent Outpatient Visits           3 months ago Acute cystitis with hematuria   [place], Megan P, DO   4 months ago Acute left-sided thoracic back pain   [place], Megan P, DO   5 months ago Acute cystitis without hematuria   [facility], Megan P, DO   9 months ago Type 2 diabetes mellitus with stage 3 chronic kidney disease, with long-term current use of insulin, unspecified whether stage 3a or 3b CKD ([place])   [place], Megan P, DO   1 year ago Type 2 diabetes mellitus with stage 3 chronic kidney disease, with long-term current use of insulin, unspecified whether stage 3a or 3b CKD ([place])   [place], Barb Merino, DO       Future Appointments             In 3 days Wynetta Emery, Barb Merino, DO [place], [place]   In 13 months  [facility] [employer], [facility]

## 2021-02-15 ENCOUNTER — Other Ambulatory Visit: Payer: Self-pay

## 2021-02-15 ENCOUNTER — Ambulatory Visit (INDEPENDENT_AMBULATORY_CARE_PROVIDER_SITE_OTHER): Payer: Medicare Other | Admitting: Family Medicine

## 2021-02-15 ENCOUNTER — Encounter: Payer: Self-pay | Admitting: Family Medicine

## 2021-02-15 VITALS — BP 114/68 | HR 67 | Temp 98.1°F | Wt 217.8 lb

## 2021-02-15 DIAGNOSIS — I129 Hypertensive chronic kidney disease with stage 1 through stage 4 chronic kidney disease, or unspecified chronic kidney disease: Secondary | ICD-10-CM | POA: Diagnosis not present

## 2021-02-15 DIAGNOSIS — I70219 Atherosclerosis of native arteries of extremities with intermittent claudication, unspecified extremity: Secondary | ICD-10-CM | POA: Insufficient documentation

## 2021-02-15 DIAGNOSIS — Z794 Long term (current) use of insulin: Secondary | ICD-10-CM

## 2021-02-15 DIAGNOSIS — I10 Essential (primary) hypertension: Secondary | ICD-10-CM | POA: Diagnosis not present

## 2021-02-15 DIAGNOSIS — N3001 Acute cystitis with hematuria: Secondary | ICD-10-CM

## 2021-02-15 DIAGNOSIS — I739 Peripheral vascular disease, unspecified: Secondary | ICD-10-CM | POA: Diagnosis not present

## 2021-02-15 DIAGNOSIS — E1122 Type 2 diabetes mellitus with diabetic chronic kidney disease: Secondary | ICD-10-CM

## 2021-02-15 DIAGNOSIS — J449 Chronic obstructive pulmonary disease, unspecified: Secondary | ICD-10-CM | POA: Diagnosis not present

## 2021-02-15 DIAGNOSIS — R5383 Other fatigue: Secondary | ICD-10-CM | POA: Diagnosis not present

## 2021-02-15 DIAGNOSIS — R519 Headache, unspecified: Secondary | ICD-10-CM | POA: Diagnosis not present

## 2021-02-15 DIAGNOSIS — E039 Hypothyroidism, unspecified: Secondary | ICD-10-CM

## 2021-02-15 DIAGNOSIS — I25110 Atherosclerotic heart disease of native coronary artery with unstable angina pectoris: Secondary | ICD-10-CM

## 2021-02-15 DIAGNOSIS — R1031 Right lower quadrant pain: Secondary | ICD-10-CM

## 2021-02-15 DIAGNOSIS — N183 Chronic kidney disease, stage 3 unspecified: Secondary | ICD-10-CM | POA: Diagnosis not present

## 2021-02-15 LAB — URINALYSIS, ROUTINE W REFLEX MICROSCOPIC
Bilirubin, UA: NEGATIVE
Ketones, UA: NEGATIVE
Nitrite, UA: NEGATIVE
Specific Gravity, UA: 1.02 (ref 1.005–1.030)
Urobilinogen, Ur: 0.2 mg/dL (ref 0.2–1.0)
pH, UA: 5 (ref 5.0–7.5)

## 2021-02-15 LAB — MICROSCOPIC EXAMINATION: WBC, UA: >30 /hpf — ABNORMAL HIGH (ref 0–5)

## 2021-02-15 LAB — BAYER DCA HB A1C WAIVED: HB A1C (BAYER DCA - WAIVED): 8 % — ABNORMAL HIGH (ref 4.8–5.6)

## 2021-02-15 MED ORDER — CIPROFLOXACIN HCL 500 MG PO TABS: 500.0000 mg | ORAL_TABLET | Freq: Two times a day (BID) | ORAL | 0 refills | Status: DC

## 2021-02-15 MED ORDER — METOPROLOL SUCCINATE ER 50 MG PO TB24: ORAL_TABLET | ORAL | 1 refills | Status: DC

## 2021-02-15 MED ORDER — ROSUVASTATIN CALCIUM 40 MG PO TABS: 40.0000 mg | ORAL_TABLET | Freq: Every day | ORAL | 1 refills | Status: DC

## 2021-02-15 MED ORDER — TRESIBA FLEXTOUCH 100 UNIT/ML ~~LOC~~ SOPN: 28.0000 [IU] | PEN_INJECTOR | Freq: Every day | SUBCUTANEOUS | 1 refills | Status: AC

## 2021-02-15 MED ORDER — TRULICITY 1.5 MG/0.5ML ~~LOC~~ SOAJ: 4.5000 mg | SUBCUTANEOUS | 6 refills | Status: AC

## 2021-02-15 MED ORDER — APIXABAN 2.5 MG PO TABS: 2.5000 mg | ORAL_TABLET | Freq: Two times a day (BID) | ORAL | 1 refills | Status: DC

## 2021-02-15 MED ORDER — GLIPIZIDE 5 MG PO TABS: ORAL_TABLET | ORAL | 1 refills | Status: DC

## 2021-02-15 MED ORDER — PANTOPRAZOLE SODIUM 40 MG PO TBEC: 40.0000 mg | DELAYED_RELEASE_TABLET | Freq: Every day | ORAL | 1 refills | Status: DC

## 2021-02-15 MED ORDER — PREDNISONE 20 MG PO TABS: 60.0000 mg | ORAL_TABLET | Freq: Every day | ORAL | 0 refills | Status: DC

## 2021-02-15 MED ORDER — DULOXETINE HCL 60 MG PO CPEP
120.0000 mg | ORAL_CAPSULE | Freq: Every day | ORAL | 1 refills | Status: DC
Start: 2021-02-15 — End: 2021-09-09

## 2021-02-15 MED ORDER — EMPAGLIFLOZIN 25 MG PO TABS: 25.0000 mg | ORAL_TABLET | Freq: Every day | ORAL | 1 refills | Status: DC

## 2021-02-15 MED ORDER — ISOSORBIDE MONONITRATE ER 60 MG PO TB24: ORAL_TABLET | ORAL | 1 refills | Status: DC

## 2021-02-15 NOTE — Patient Instructions (Addendum)
Dr Dahlia Byes on 02/23/21 @ 4:15 [place] [id number] [phone]

## 2021-02-15 NOTE — Progress Notes (Signed)
BP 114/68   Pulse 67   Temp 98.1 F (36.7 C)   Wt 217 lb 12.8 oz (98.8 kg)   SpO2 98%   BMI 37.39 kg/m    Subjective:    Patient ID: Tammy [name], female    DOB: 01/17/1946, 75 y.o.   MRN: [id number]  HPI: Tammy [name] is a 75 y.o. female  Chief Complaint  Patient presents with   Diabetes    Patient states she is concerned because the pharmacy does not have the high dose trulicty and she will miss her dose today    Hypothyroidism    Patient states she would like her synthroid for a 90 day supply instead of a 30 day.   Headache    Patient states she has been having daily headaches. Patient states she is very tired and has no energy, states she falls asleep very suddenly    Pain    Patient states she has pain in her lower abdomen x 2-3 weeks, also pain under her ribs. Patient states areas are sore to the touch    DIABETES Hypoglycemic episodes:no Polydipsia/polyuria: no Visual disturbance: no Chest pain: no Paresthesias: yes Glucose Monitoring: yes  Accucheck frequency: Daily- "everywhere" 110-180 Taking Insulin?: no Blood Pressure Monitoring: not checking Retinal Examination: Up to Date Foot Exam: Up to Date Diabetic Education: Completed Pneumovax: Up to Date Influenza: Up to Date Aspirin: yes  HYPOTHYROIDISM Thyroid control status: unsure Satisfied with current treatment?  unsure Medication side effects: no Medication compliance: excellent compliance Recent dose adjustment:no Fatigue: yes Cold intolerance: no Heat intolerance: no Weight gain: no Weight loss: no Constipation: no Diarrhea/loose stools: yes Palpitations: no Lower extremity edema: no Anxiety/depressed mood: no  ???SLEEP APNEA Sleep apnea status: unknown Duration: chronic Satisfied with current treatment?:  not on anything CPAP use:  no Last sleep study: never Treatments attempted: none Wakes feeling refreshed:  no Daytime hypersomnolence:  yes Fatigue:  yes Insomnia:  no Good sleep  hygiene:  yes Difficulty falling asleep:  no Difficulty staying asleep:  yes Snoring bothers bed partner:  no Observed apnea by bed partner: no Obesity:  yes Hypertension: yes  Pulmonary hypertension:  no Coronary artery disease:  yes  HYPERTENSION / HYPERLIPIDEMIA Satisfied with current treatment? yes Duration of hypertension: chronic BP monitoring frequency: not checking BP medication side effects: no Duration of hyperlipidemia: chronic Cholesterol medication side effects: no Cholesterol supplements: none Past cholesterol medications: crestor Medication compliance: excellent compliance Aspirin: no Recent stressors: no Recurrent headaches: no Visual changes: no Palpitations: no Dyspnea: no Chest pain: no Lower extremity edema: no Dizzy/lightheaded: no   Relevant past medical, surgical, family and social history reviewed and updated as indicated. Interim medical history since our last visit reviewed. Allergies and medications reviewed and updated.  Review of Systems  Constitutional:  Positive for fatigue. Negative for activity change, appetite change, chills, diaphoresis, fever and unexpected weight change.  HENT: Negative.    Respiratory: Negative.    Cardiovascular: Negative.   Gastrointestinal:  Positive for abdominal pain (RLQ). Negative for abdominal distention, anal bleeding, blood in stool, constipation, diarrhea, nausea, rectal pain and vomiting.  Musculoskeletal:  Positive for arthralgias, back pain and myalgias. Negative for gait problem, joint swelling, neck pain and neck stiffness.  Neurological:  Positive for headaches (over her temple). Negative for dizziness, tremors, seizures, syncope, facial asymmetry, speech difficulty, weakness, light-headedness and numbness.   Per HPI unless specifically indicated above     Objective:    BP 114/68  Pulse 67   Temp 98.1 F (36.7 C)   Wt 217 lb 12.8 oz (98.8 kg)   SpO2 98%   BMI 37.39 kg/m   Wt Readings from  Last 3 Encounters:  02/15/21 217 lb 12.8 oz (98.8 kg)  01/17/21 217 lb (98.4 kg)  12/22/20 (P) 215 lb (97.5 kg)    Physical Exam Vitals and nursing note reviewed.  Constitutional:      General: She is not in acute distress.    Appearance: Normal appearance. She is obese. She is not ill-appearing, toxic-appearing or diaphoretic.  HENT:     Head: Normocephalic and atraumatic.     Right Ear: External ear normal.     Left Ear: External ear normal.     Nose: Nose normal.     Mouth/Throat:     Mouth: Mucous membranes are moist.     Pharynx: Oropharynx is clear.  Eyes:     General: No scleral icterus.       Right eye: No discharge.        Left eye: No discharge.     Extraocular Movements: Extraocular movements intact.     Conjunctiva/sclera: Conjunctivae normal.     Pupils: Pupils are equal, round, and reactive to light.  Cardiovascular:     Rate and Rhythm: Normal rate and regular rhythm.     Pulses: Normal pulses.     Heart sounds: Normal heart sounds. No murmur heard.   No friction rub. No gallop.  Pulmonary:     Effort: Pulmonary effort is normal. No respiratory distress.     Breath sounds: Normal breath sounds. No stridor. No wheezing, rhonchi or rales.  Chest:     Chest wall: No tenderness.  Abdominal:     General: Bowel sounds are normal. There is no distension.     Palpations: Abdomen is soft. There is no mass.     Tenderness: There is abdominal tenderness. There is no guarding.  Musculoskeletal:        General: Normal range of motion.     Cervical back: Normal range of motion and neck supple.  Skin:    General: Skin is warm and dry.     Capillary Refill: Capillary refill takes less than 2 seconds.     Coloration: Skin is not jaundiced or pale.     Findings: No bruising, erythema, lesion or rash.  Neurological:     General: No focal deficit present.     Mental Status: She is alert and oriented to person, place, and time. Mental status is at baseline.     Comments:  Tenderness over L temple  Psychiatric:        Mood and Affect: Mood normal.        Behavior: Behavior normal.        Thought Content: Thought content normal.        Judgment: Judgment normal.    Results for orders placed or performed in visit on 02/15/21  Microscopic Examination   BLD  Result Value Ref Range   WBC, UA >30 (H) 0 - 5 /hpf   RBC 0-2 0 - 2 /hpf   Epithelial Cells (non renal) 0-10 0 - 10 /hpf   Mucus, UA Present (A) Not Estab.   Bacteria, UA Many (A) None seen/Few   Yeast, UA Present (A) None seen  Bayer DCA Hb A1c Waived  Result Value Ref Range   HB A1C (BAYER DCA - WAIVED) 8.0 (H) 4.8 - 5.6 %  Comprehensive metabolic  panel  Result Value Ref Range   Glucose 163 (H) 70 - 99 mg/dL   BUN 31 (H) 8 - 27 mg/dL   Creatinine, Ser 1.70 (H) 0.57 - 1.00 mg/dL   eGFR 31 (L) >59 mL/min/1.73   BUN/Creatinine Ratio 18 12 - 28   Sodium 139 134 - 144 mmol/L   Potassium 4.8 3.5 - 5.2 mmol/L   Chloride 100 96 - 106 mmol/L   CO2 23 20 - 29 mmol/L   Calcium 9.5 8.7 - 10.3 mg/dL   Total Protein 6.7 6.0 - 8.5 g/dL   Albumin 4.1 3.7 - 4.7 g/dL   Globulin, Total 2.6 1.5 - 4.5 g/dL   Albumin/Globulin Ratio 1.6 1.2 - 2.2   Bilirubin Total 0.3 0.0 - 1.2 mg/dL   Alkaline Phosphatase 90 44 - 121 IU/L   AST 22 0 - 40 IU/L   ALT 16 0 - 32 IU/L  Lipid Panel w/o Chol/HDL Ratio  Result Value Ref Range   Cholesterol, Total 161 100 - 199 mg/dL   Triglycerides 204 (H) 0 - 149 mg/dL   HDL 49 >39 mg/dL   VLDL Cholesterol Cal 34 5 - 40 mg/dL   LDL Chol Calc (NIH) 78 0 - 99 mg/dL  CBC with Differential/Platelet  Result Value Ref Range   WBC 8.6 3.4 - 10.8 x10E3/uL   RBC 4.89 3.77 - 5.28 x10E6/uL   Hemoglobin 13.2 11.1 - 15.9 g/dL   Hematocrit 42.2 34.0 - 46.6 %   MCV 86 79 - 97 fL   MCH 27.0 26.6 - 33.0 pg   MCHC 31.3 (L) 31.5 - 35.7 g/dL   RDW 13.6 11.7 - 15.4 %   Platelets 312 150 - 450 x10E3/uL   Neutrophils 55 Not Estab. %   Lymphs 28 Not Estab. %   Monocytes 12 Not Estab. %    Eos 3 Not Estab. %   Basos 1 Not Estab. %   Neutrophils Absolute 4.8 1.4 - 7.0 x10E3/uL   Lymphocytes Absolute 2.4 0.7 - 3.1 x10E3/uL   Monocytes Absolute 1.0 (H) 0.1 - 0.9 x10E3/uL   EOS (ABSOLUTE) 0.3 0.0 - 0.4 x10E3/uL   Basophils Absolute 0.1 0.0 - 0.2 x10E3/uL   Immature Granulocytes 1 Not Estab. %   Immature Grans (Abs) 0.1 0.0 - 0.1 x10E3/uL  Urinalysis, Routine w reflex microscopic  Result Value Ref Range   Specific Gravity, UA 1.020 1.005 - 1.030   pH, UA 5.0 5.0 - 7.5   Color, UA Yellow Yellow   Appearance Ur Cloudy (A) Clear   Leukocytes,UA 3+ (A) Negative   Protein,UA 1+ (A) Negative/Trace   Glucose, UA 3+ (A) Negative   Ketones, UA Negative Negative   RBC, UA 1+ (A) Negative   Bilirubin, UA Negative Negative   Urobilinogen, Ur 0.2 0.2 - 1.0 mg/dL   Nitrite, UA Negative Negative   Microscopic Examination See below:   Sedimentation rate  Result Value Ref Range   Sed Rate 12 0 - 40 mm/hr      Assessment & Plan:   Problem List Items Addressed This Visit       Cardiovascular and Mediastinum   Essential hypertension    Under good control on current regimen. Continue current regimen. Continue to monitor. Call with any concerns. Refills given. Labs drawn today.        Relevant Medications   apixaban (ELIQUIS) 2.5 MG TABS tablet   isosorbide mononitrate (IMDUR) 60 MG 24 hr tablet   metoprolol succinate (TOPROL-XL) 50 MG  24 hr tablet   rosuvastatin (CRESTOR) 40 MG tablet     Respiratory   COPD (chronic obstructive pulmonary disease) ([place])    Under good control on current regimen. Continue current regimen. Continue to monitor. Call with any concerns. Refills given. Labs drawn today.        Relevant Medications   predniSONE (DELTASONE) 20 MG tablet     Endocrine   Hypothyroid    Rechecking labs today. Await results. Treat as needed.       Relevant Medications   metoprolol succinate (TOPROL-XL) 50 MG 24 hr tablet   Type 2 diabetes mellitus with stage 3  chronic kidney disease ([place]) - Primary    Stable with A1c of 8.0. Continue diet and exercise. Call with any concerns.       Relevant Medications   empagliflozin (JARDIANCE) 25 MG TABS tablet   glipiZIDE (GLUCOTROL) 5 MG tablet   rosuvastatin (CRESTOR) 40 MG tablet   insulin degludec (TRESIBA FLEXTOUCH) 100 UNIT/ML FlexTouch Pen   Dulaglutide (TRULICITY) 1.5 VA/9.1BT SOPN   Other Relevant Orders   Bayer DCA Hb A1c Waived (Completed)   Comprehensive metabolic panel (Completed)   Lipid Panel w/o Chol/HDL Ratio (Completed)   CBC with Differential/Platelet (Completed)     Genitourinary   Benign hypertensive renal disease    Under good control on current regimen. Continue current regimen. Continue to monitor. Call with any concerns. Refills given. Labs drawn today.          Other   Intermittent claudication ([place])    Continue to follow with vascular. Call with any concerns. Continue to monitor. Call with any concerns.       Relevant Medications   DULoxetine (CYMBALTA) 60 MG capsule   predniSONE (DELTASONE) 20 MG tablet   Other Visit Diagnoses     Fatigue, unspecified type       Checking labs today. Await results. Needs sleep study- will arrange for at home if possible. Treat as needed.   Relevant Orders   Ambulatory referral to Sleep Studies   Temporal pain       Will check ESR and get into general surgery for bx. Start prednisone. Concern for TA. Call with any concerns.    Relevant Medications   DULoxetine (CYMBALTA) 60 MG capsule   metoprolol succinate (TOPROL-XL) 50 MG 24 hr tablet   Other Relevant Orders   Sed Rate (ESR)   Ambulatory referral to General Surgery   RLQ abdominal pain       Checking labs. UA shows UTI- will treat and see if she improves.    Relevant Orders   Urinalysis, Routine w reflex microscopic   Acute cystitis with hematuria       Will treat with abx. Call with any concerns or if not improving.        Follow up plan: Return in about 2 weeks  (around 03/01/2021).

## 2021-02-16 ENCOUNTER — Encounter: Payer: Self-pay | Admitting: Family Medicine

## 2021-02-16 LAB — CBC WITH DIFFERENTIAL/PLATELET
Basophils Absolute: 0.1 10*3/uL (ref 0.0–0.2)
Basos: 1 %
EOS (ABSOLUTE): 0.3 10*3/uL (ref 0.0–0.4)
Eos: 3 %
Hematocrit: 42.2 % (ref 34.0–46.6)
Hemoglobin: 13.2 g/dL (ref 11.1–15.9)
Immature Grans (Abs): 0.1 10*3/uL (ref 0.0–0.1)
Immature Granulocytes: 1 %
Lymphocytes Absolute: 2.4 10*3/uL (ref 0.7–3.1)
Lymphs: 28 %
MCH: 27 pg (ref 26.6–33.0)
MCHC: 31.3 g/dL — ABNORMAL LOW (ref 31.5–35.7)
MCV: 86 fL (ref 79–97)
Monocytes Absolute: 1 10*3/uL — ABNORMAL HIGH (ref 0.1–0.9)
Monocytes: 12 %
Neutrophils Absolute: 4.8 10*3/uL (ref 1.4–7.0)
Neutrophils: 55 %
Platelets: 312 10*3/uL (ref 150–450)
RBC: 4.89 x10E6/uL (ref 3.77–5.28)
RDW: 13.6 % (ref 11.7–15.4)
WBC: 8.6 10*3/uL (ref 3.4–10.8)

## 2021-02-16 LAB — COMPREHENSIVE METABOLIC PANEL
ALT: 16 IU/L (ref 0–32)
AST: 22 IU/L (ref 0–40)
Albumin/Globulin Ratio: 1.6 (ref 1.2–2.2)
Albumin: 4.1 g/dL (ref 3.7–4.7)
Alkaline Phosphatase: 90 IU/L (ref 44–121)
BUN/Creatinine Ratio: 18 (ref 12–28)
BUN: 31 mg/dL — ABNORMAL HIGH (ref 8–27)
Bilirubin Total: 0.3 mg/dL (ref 0.0–1.2)
CO2: 23 mmol/L (ref 20–29)
Calcium: 9.5 mg/dL (ref 8.7–10.3)
Chloride: 100 mmol/L (ref 96–106)
Creatinine, Ser: 1.7 mg/dL — ABNORMAL HIGH (ref 0.57–1.00)
Globulin, Total: 2.6 g/dL (ref 1.5–4.5)
Glucose: 163 mg/dL — ABNORMAL HIGH (ref 70–99)
Potassium: 4.8 mmol/L (ref 3.5–5.2)
Sodium: 139 mmol/L (ref 134–144)
Total Protein: 6.7 g/dL (ref 6.0–8.5)
eGFR: 31 mL/min/{1.73_m2} — ABNORMAL LOW (ref >59)

## 2021-02-16 LAB — LIPID PANEL W/O CHOL/HDL RATIO
Cholesterol, Total: 161 mg/dL (ref 100–199)
HDL: 49 mg/dL (ref >39)
LDL Chol Calc (NIH): 78 mg/dL (ref 0–99)
Triglycerides: 204 mg/dL — ABNORMAL HIGH (ref 0–149)
VLDL Cholesterol Cal: 34 mg/dL (ref 5–40)

## 2021-02-16 LAB — SEDIMENTATION RATE: Sed Rate: 12 mm/hr (ref 0–40)

## 2021-02-16 NOTE — Assessment & Plan Note (Signed)
Under good control on current regimen. Continue current regimen. Continue to monitor. Call with any concerns. Refills given. Labs drawn today.   

## 2021-02-16 NOTE — Assessment & Plan Note (Signed)
Stable with A1c of 8.0. Continue diet and exercise. Call with any concerns.

## 2021-02-16 NOTE — Assessment & Plan Note (Signed)
Continue to follow with vascular. Call with any concerns. Continue to monitor. Call with any concerns.

## 2021-02-16 NOTE — Assessment & Plan Note (Signed)
Rechecking labs today. Await results. Treat as needed.  °

## 2021-02-17 ENCOUNTER — Ambulatory Visit: Payer: Self-pay | Admitting: *Deleted

## 2021-02-17 ENCOUNTER — Telehealth: Payer: Self-pay

## 2021-02-17 NOTE — Telephone Encounter (Signed)
Reason for Disposition  Systolic BP  >= 662 OR Diastolic >= 947  Answer Assessment - Initial Assessment Questions 1. BLOOD PRESSURE: "What is the blood pressure?" "Did you take at least two measurements 5 minutes apart?"     168/80 2. ONSET: "When did you take your blood pressure?"     This AM 3. HOW: "How did you obtain the blood pressure?" (e.g., visiting nurse, automatic home BP monitor)     Home monitor 4. HISTORY: "Do you have a history of high blood pressure?"     yes 5. MEDICATIONS: "Are you taking any medications for blood pressure?" "Have you missed any doses recently?"     Yes, no missed doses 6. OTHER SYMPTOMS: "Do you have any symptoms?" (e.g., headache, chest pain, blurred vision, difficulty breathing, weakness)     BS last night 506, this AM 166, mild headache.  Protocols used: Blood Pressure - High-A-AH

## 2021-02-17 NOTE — Chronic Care Management (AMB) (Signed)
Chronic Care Management Pharmacy Assistant   Name: Tammy [name]  MRN: [id number] DOB: [birth year]  Reason for Encounter: Disease State Diabetes Mellitus  Recent office visits:  02/15/21-Tammy Annia Friendly, DO (PCP) General follow up visit. Labs ordered.  Ambulatory referral to Sleep Studies. Ambulatory referral to General Surgery. Follow up in 2 weeks. 01/17/21-Tammy Ann Lions, MD (Oncology) Consultation. Labs ordered. Follow up in mid April.  Recent consult visits:  None noted  Hospital visits:  None in previous 6 months  Medications: Outpatient Encounter Medications as of 02/17/2021  Medication Sig   albuterol (VENTOLIN HFA) 108 (90 Base) MCG/ACT inhaler Inhale 2 puffs into the lungs every 6 (six) hours as needed for wheezing or shortness of breath.   anastrozole (ARIMIDEX) 1 MG tablet Take 1 tablet (1 mg total) by mouth daily.   apixaban (ELIQUIS) 2.5 MG TABS tablet Take 1 tablet (2.5 mg total) by mouth 2 (two) times daily.   blood glucose meter kit and supplies KIT Dispense based on patient and insurance preference. Use one time daily as directed, E11.9   Calcium Carb-Cholecalciferol (CALCIUM 600 + D) 600-200 MG-UNIT TABS Take 1 tablet by mouth daily.    cetirizine (ZYRTEC) 10 MG tablet TAKE 1 TABLET BY MOUTH DAILY   cholecalciferol (VITAMIN D3) 25 MCG (1000 UT) tablet Take 1,000 Units by mouth daily.   ciprofloxacin (CIPRO) 500 MG tablet Take 1 tablet (500 mg total) by mouth 2 (two) times daily.   CRANBERRY PO Take 2 capsules by mouth daily.   cyanocobalamin (,VITAMIN B-12,) 1000 MCG/ML injection Inject 1,000 mcg into the muscle once a week. Then once a month x 4 months (Patient not taking: Reported on 11/10/2020)   Dulaglutide (TRULICITY) 1.5 LP/3.7TK SOPN Inject 4.5 mg into the skin once a week.   DULoxetine (CYMBALTA) 60 MG capsule Take 2 capsules (120 mg total) by mouth daily.   empagliflozin (JARDIANCE) 25 MG TABS tablet Take 1 tablet (25 mg total) by mouth daily. TAKE  ONE TABLET 25MG EVERY DAY BEFORE BREAKFAST   gabapentin (NEURONTIN) 300 MG capsule Take 1 capsule (300 mg total) by mouth 3 (three) times daily.   glipiZIDE (GLUCOTROL) 5 MG tablet TAKE ONE TABLET BY MOUTH EVERY DAY BEFORE BREAKFAST   insulin degludec (TRESIBA FLEXTOUCH) 100 UNIT/ML FlexTouch Pen Inject 28 Units into the skin at bedtime.   Insulin Pen Needle (PEN NEEDLES 3/16") 31G X 5 MM MISC 1 each by Does not apply route daily.   isosorbide mononitrate (IMDUR) 60 MG 24 hr tablet TAKE ONE TABLET BY MOUTH AT BEDTIME   Lactobacillus (PROBIOTIC ACIDOPHILUS PO) Take 1 capsule by mouth daily at 12 noon.   metoprolol succinate (TOPROL-XL) 50 MG 24 hr tablet TAKE 1 TABLET BY MOUTH DAILY WITH OR IMMEDIATLEY FOLLOWING A MEAL   montelukast (SINGULAIR) 10 MG tablet TAKE 1 TABLET BY MOUTH AT BEDTIME   MYRBETRIQ 50 MG TB24 tablet TAKE 1 TABLET BY MOUTH DAILY   nitroGLYCERIN (NITROSTAT) 0.4 MG SL tablet Place 1 tablet (0.4 mg total) under the tongue every 5 (five) minutes as needed for chest pain. Total of 3 doses   pantoprazole (PROTONIX) 40 MG tablet Take 1 tablet (40 mg total) by mouth daily.   predniSONE (DELTASONE) 20 MG tablet Take 3 tablets (60 mg total) by mouth daily with breakfast.   Pumpkin Seed-Soy Germ (AZO BLADDER CONTROL/GO-LESS PO) Take 1 tablet by mouth in the morning and at bedtime.   rosuvastatin (CRESTOR) 40 MG tablet Take 1 tablet (40 mg  total) by mouth daily.   senna (SENOKOT) 8.6 MG TABS tablet Take 8.6 mg by mouth daily as needed for mild constipation.   SYNTHROID 150 MCG tablet TAKE ONE TABLET ON AN EMPTY STOMACH WITHA GLASS OF WATER AT LEAST 30 TO 60 MINUTES BEFORE BREAKFAST   trimethoprim (TRIMPEX) 100 MG tablet TAKE ONE TABLET BY MOUTH EVERY DAY   TRULICITY 4.5 YF/7.4BS SOPN INJECT 4.5MG ONCE WEEKLY AS DIRECTED   UNABLE TO FIND Med Name: Energy, memory and mood enhancer   ZINC CITRATE PO Take by mouth.   No facility-administered encounter medications on file as of 02/17/2021.    Current antihyperglycemic regimen:  Trulicity 4.5 mg once weekly Tresiba 24 units daily Glipizide 5 mg once daily Jardiance 25 mg daily  What recent interventions/DTPs have been made to improve glycemic control:  None noted  Have there been any recent hospitalizations or ED visits since last visit with CPP? No  Patient denies hypoglycemic symptoms, including Pale, Sweaty, Shaky, Hungry, Nervous/irritable, and Vision changes  Patient reports hyperglycemic symptoms, including fatigue  How often are you checking your blood sugar? once daily  What are your blood sugars ranging?  Fasting: 03/02/21-156 Before meals:  After meals:  Bedtime:   During the week, how often does your blood glucose drop below 70? Never  Are you checking your feet daily/regularly?  Patient states she does check her feet daily.  Patient states she was told by her PCP to stop taking her Jardiance due to her urologist concerned about kidney function. Patient states she was told to stop for about 1 month.   Patient was on prednisone for one day and her blood sugar went to 496 and her trulicity was on back order as well now she is able to receive her Trulicity which she started on Monday 02/28/21.  Adherence Review: Is the patient currently on a STATIN medication? Yes Is the patient currently on ACE/ARB medication? Yes Does the patient have >5 day gap between last estimated fill dates? No   Care Gaps: TETANUS/TDAP:Never done COVID-19 Vaccine:Last completed: May 29, 2019 COLONOSCOPY:Last completed: Jan 28, 2019  Star Rating Drugs: Trulicity 1.5 mg Last PRFFMB:84/66/59 28 DS Jardiance 25 mg Last filled:11/30/20 90 DS Glipizide 5 mg Last filled:02/09/21 90 DS Rosuvastatin 40 mg Last filled:12/06/20 90 DS Tresiba 100 unit Last filled:12/25/20 63 DS  Tammy [name], Tammy [name]

## 2021-02-17 NOTE — Telephone Encounter (Signed)
Patient states has a couple headaches and she thinks it may be from her blood pressure being elevated the past couple of days. Patient states her blood sugars were 576 last night. Patient states she currently has UTI and says she has been drinking water all night. Patient states she spoke with Dellie Catholic and she encouraged patient to drink plenty of fluids. Patient states she has taken the first dose of Prednisone yesterday and she is currently scared to take the next dose cause she doesn't know if the Prednisone is the reason she feels like this. Please advise?

## 2021-02-17 NOTE — Telephone Encounter (Signed)
Pt reports BP 168/80 this AM. States usually runs 114/70. Reports mild headache. Also reports BS last night 506, this AM 166. Reports started prednisone yesterday, has UTI. Denies any associated symptoms except "Urinated a lot last night because I drank a lot to get BS down." Declines appt, "Just wanted Dr. Wynetta Emery to know and should I keep taking the prednisone?"  Assured pt NT would route to practice for PCPs review and final disposition.  Please advise:  [phone]  Pt's husband also triaged, please see encounter.

## 2021-02-17 NOTE — Telephone Encounter (Signed)
Please find out if her headache is gone on the prednisone

## 2021-02-18 ENCOUNTER — Telehealth (INDEPENDENT_AMBULATORY_CARE_PROVIDER_SITE_OTHER): Payer: Medicare Other | Admitting: Family Medicine

## 2021-02-18 ENCOUNTER — Encounter: Payer: Self-pay | Admitting: Family Medicine

## 2021-02-18 ENCOUNTER — Telehealth: Payer: Self-pay | Admitting: Family Medicine

## 2021-02-18 VITALS — BP 108/67 | HR 86

## 2021-02-18 DIAGNOSIS — R519 Headache, unspecified: Secondary | ICD-10-CM

## 2021-02-18 NOTE — Telephone Encounter (Signed)
Pt scheduled for today at 4:20 for a Mychart visit

## 2021-02-18 NOTE — Telephone Encounter (Signed)
Please call [facility]- needs to be see ASAP (Saw them previously in April) for ???Temporal Arteritis- unable to tolerate prednisone. To see surgery on Wednesday

## 2021-02-18 NOTE — Progress Notes (Signed)
BP 108/67   Pulse 86    Subjective:    Patient ID: Tammy [name], female    DOB: Aug 14, 1945, 75 y.o.   MRN: [id number]  HPI: Tammy [name] is a 75 y.o. female  Chief Complaint  Patient presents with   Medication Reaction    Patient was prescribed prednisone and made BS go high. Patient only took one dose of prednisone.   Headache    Patient states she is having a headache, has been having headaches recently but today seems to be worse.    [name] says she is feeling better. She stopped her steroid after 1 dose. She is not sure if it helped with her temporal pain. She notes that she has a headache today, but it is not temporal and her husband went to the hospital, so she notes that she is very anxious. She notes that her sugars got over 500 with the prednisone and her BP was in the 170s-180s. She was feeling really poorly, and therefore she stopped the medicine. She has an appointment to see general surgery on Wednesday. She is otherwise doing OK at this time.   Relevant past medical, surgical, family and social history reviewed and updated as indicated. Interim medical history since our last visit reviewed. Allergies and medications reviewed and updated.  Review of Systems  Constitutional: Negative.   Respiratory: Negative.    Cardiovascular: Negative.   Genitourinary: Negative.   Musculoskeletal: Negative.   Neurological:  Positive for headaches. Negative for dizziness, tremors, seizures, syncope, facial asymmetry, speech difficulty, weakness, light-headedness and numbness.  Psychiatric/Behavioral: Negative.     Per HPI unless specifically indicated above     Objective:    BP 108/67   Pulse 86   Wt Readings from Last 3 Encounters:  02/15/21 217 lb 12.8 oz (98.8 kg)  01/17/21 217 lb (98.4 kg)  12/22/20 (P) 215 lb (97.5 kg)    Physical Exam Vitals and nursing note reviewed.  Pulmonary:     Effort: Pulmonary effort is normal. No respiratory distress.     Comments: Speaking in  full sentences Neurological:     Mental Status: She is alert.  Psychiatric:        Mood and Affect: Mood normal.        Behavior: Behavior normal.        Thought Content: Thought content normal.        Judgment: Judgment normal.    Results for orders placed or performed in visit on 02/15/21  Microscopic Examination   BLD  Result Value Ref Range   WBC, UA >30 (H) 0 - 5 /hpf   RBC 0-2 0 - 2 /hpf   Epithelial Cells (non renal) 0-10 0 - 10 /hpf   Mucus, UA Present (A) Not Estab.   Bacteria, UA Many (A) None seen/Few   Yeast, UA Present (A) None seen  Bayer DCA Hb A1c Waived  Result Value Ref Range   HB A1C (BAYER DCA - WAIVED) 8.0 (H) 4.8 - 5.6 %  Comprehensive metabolic panel  Result Value Ref Range   Glucose 163 (H) 70 - 99 mg/dL   BUN 31 (H) 8 - 27 mg/dL   Creatinine, Ser 1.70 (H) 0.57 - 1.00 mg/dL   eGFR 31 (L) >59 mL/min/1.73   BUN/Creatinine Ratio 18 12 - 28   Sodium 139 134 - 144 mmol/L   Potassium 4.8 3.5 - 5.2 mmol/L   Chloride 100 96 - 106 mmol/L   CO2 23 20 -  29 mmol/L   Calcium 9.5 8.7 - 10.3 mg/dL   Total Protein 6.7 6.0 - 8.5 g/dL   Albumin 4.1 3.7 - 4.7 g/dL   Globulin, Total 2.6 1.5 - 4.5 g/dL   Albumin/Globulin Ratio 1.6 1.2 - 2.2   Bilirubin Total 0.3 0.0 - 1.2 mg/dL   Alkaline Phosphatase 90 44 - 121 IU/L   AST 22 0 - 40 IU/L   ALT 16 0 - 32 IU/L  Lipid Panel w/o Chol/HDL Ratio  Result Value Ref Range   Cholesterol, Total 161 100 - 199 mg/dL   Triglycerides 204 (H) 0 - 149 mg/dL   HDL 49 >39 mg/dL   VLDL Cholesterol Cal 34 5 - 40 mg/dL   LDL Chol Calc (NIH) 78 0 - 99 mg/dL  CBC with Differential/Platelet  Result Value Ref Range   WBC 8.6 3.4 - 10.8 x10E3/uL   RBC 4.89 3.77 - 5.28 x10E6/uL   Hemoglobin 13.2 11.1 - 15.9 g/dL   Hematocrit 42.2 34.0 - 46.6 %   MCV 86 79 - 97 fL   MCH 27.0 26.6 - 33.0 pg   MCHC 31.3 (L) 31.5 - 35.7 g/dL   RDW 13.6 11.7 - 15.4 %   Platelets 312 150 - 450 x10E3/uL   Neutrophils 55 Not Estab. %   Lymphs 28 Not  Estab. %   Monocytes 12 Not Estab. %   Eos 3 Not Estab. %   Basos 1 Not Estab. %   Neutrophils Absolute 4.8 1.4 - 7.0 x10E3/uL   Lymphocytes Absolute 2.4 0.7 - 3.1 x10E3/uL   Monocytes Absolute 1.0 (H) 0.1 - 0.9 x10E3/uL   EOS (ABSOLUTE) 0.3 0.0 - 0.4 x10E3/uL   Basophils Absolute 0.1 0.0 - 0.2 x10E3/uL   Immature Granulocytes 1 Not Estab. %   Immature Grans (Abs) 0.1 0.0 - 0.1 x10E3/uL  Urinalysis, Routine w reflex microscopic  Result Value Ref Range   Specific Gravity, UA 1.020 1.005 - 1.030   pH, UA 5.0 5.0 - 7.5   Color, UA Yellow Yellow   Appearance Ur Cloudy (A) Clear   Leukocytes,UA 3+ (A) Negative   Protein,UA 1+ (A) Negative/Trace   Glucose, UA 3+ (A) Negative   Ketones, UA Negative Negative   RBC, UA 1+ (A) Negative   Bilirubin, UA Negative Negative   Urobilinogen, Ur 0.2 0.2 - 1.0 mg/dL   Nitrite, UA Negative Negative   Microscopic Examination See below:   Sedimentation rate  Result Value Ref Range   Sed Rate 12 0 - 40 mm/hr      Assessment & Plan:   Problem List Items Addressed This Visit   None Visit Diagnoses     Temporal pain    -  Primary   Unable to tolerate prednisone. Will get her in with neurology and general surgery for bx ASAP. Await results. She is aware of the risks.    Relevant Orders   Ambulatory referral to Neurology        Follow up plan: Return if symptoms worsen or fail to improve.   This visit was completed via telephone due to the restrictions of the COVID-19 pandemic. All issues as above were discussed and addressed but no physical exam was performed. If it was felt that the patient should be evaluated in the office, they were directed there. The patient verbally consented to this visit. Patient was unable to complete an audio/visual visit due to Lack of equipment. Due to the catastrophic nature of the COVID-19 pandemic, this visit  was done through audio contact only. Location of the patient: home Location of the provider:  work Those involved with this call:  Provider: Park Liter, DO CMA: Louanna Raw, CMA Front Desk/Registration: [employer]  Time spent on call:  15 minutes on the phone discussing health concerns. 23 minutes total spent in review of patient's record and preparation of their chart.

## 2021-02-18 NOTE — Telephone Encounter (Signed)
Can we get her on the phone for a virtual appt today please?

## 2021-02-20 ENCOUNTER — Encounter: Payer: Self-pay | Admitting: Family Medicine

## 2021-02-21 ENCOUNTER — Telehealth: Payer: Self-pay | Admitting: Family Medicine

## 2021-02-21 NOTE — Telephone Encounter (Signed)
Reached out to patient via MyChart of Dr.Johnson's recommendations.

## 2021-02-21 NOTE — Telephone Encounter (Signed)
Pt called and stated that High dose of trulicity is on back order and will run out on 02/23/21. Can the lower dose be called in. Please call patient back.

## 2021-02-21 NOTE — Telephone Encounter (Signed)
Patient unable to get medication- needs alternative solution

## 2021-02-21 NOTE — Telephone Encounter (Signed)
Unfortunately I don't have one. I would advise her to call around to other pharmacies to see who has it and I can send it there.

## 2021-02-21 NOTE — Telephone Encounter (Signed)
Called and spoke with [facility].No referral is needed so I closed referral. [facility] does not have an appointments available until after January. They are going to call patient and triage to see what they can do. May possible do a work in. Spoke with patient and she is aware.

## 2021-02-22 ENCOUNTER — Telehealth: Payer: Medicare Other

## 2021-02-22 ENCOUNTER — Ambulatory Visit: Payer: Self-pay

## 2021-02-22 DIAGNOSIS — R519 Headache, unspecified: Secondary | ICD-10-CM

## 2021-02-22 DIAGNOSIS — N183 Chronic kidney disease, stage 3 unspecified: Secondary | ICD-10-CM

## 2021-02-22 DIAGNOSIS — J449 Chronic obstructive pulmonary disease, unspecified: Secondary | ICD-10-CM

## 2021-02-22 DIAGNOSIS — I10 Essential (primary) hypertension: Secondary | ICD-10-CM

## 2021-02-22 DIAGNOSIS — E782 Mixed hyperlipidemia: Secondary | ICD-10-CM

## 2021-02-22 DIAGNOSIS — I25118 Atherosclerotic heart disease of native coronary artery with other forms of angina pectoris: Secondary | ICD-10-CM

## 2021-02-22 DIAGNOSIS — Z794 Long term (current) use of insulin: Secondary | ICD-10-CM

## 2021-02-22 DIAGNOSIS — E1165 Type 2 diabetes mellitus with hyperglycemia: Secondary | ICD-10-CM

## 2021-02-22 NOTE — Chronic Care Management (AMB) (Signed)
Chronic Care Management   CCM RN Visit Note  02/22/2021 Name: Tammy [name] MRN: [id number] DOB: October 08, 1945  Subjective: Tammy [name] is a 75 y.o. year old female who is a primary care patient of [name], DO. The care management team was consulted for assistance with disease management and care coordination needs.    Engaged with patient by telephone for follow up visit in response to provider referral for case management and/or care coordination services.   Consent to Services:  The patient was given information about Chronic Care Management services, agreed to services, and gave verbal consent prior to initiation of services.  Please see initial visit note for detailed documentation.   Patient agreed to services and verbal consent obtained.   Assessment: Review of patient past medical history, allergies, medications, health status, including review of consultants reports, laboratory and other test data, was performed as part of comprehensive evaluation and provision of chronic care management services.   SDOH (Social Determinants of Health) assessments and interventions performed:    CCM Care Plan  Allergies  Allergen Reactions   Hydrocodone Other (See Comments)    Unresponsive   Aleve [Naproxen Sodium]     Hives & itching    Contrast Media [Iodinated Diagnostic Agents]     Pt avoids due to kidney issues   Ioxaglate Other (See Comments)    (iodine) Pt avoids due to kidney issues    Oxycodone Other (See Comments)    hallucinations   Ranexa [Ranolazine]     Sick on stomach   Sulfa Antibiotics Other (See Comments)    Pt avoids due to kidney issues   Tramadol Other (See Comments)    nightmares    Outpatient Encounter Medications as of 02/22/2021  Medication Sig   albuterol (VENTOLIN HFA) 108 (90 Base) MCG/ACT inhaler Inhale 2 puffs into the lungs every 6 (six) hours as needed for wheezing or shortness of breath.   anastrozole (ARIMIDEX) 1 MG tablet Take 1 tablet (1  mg total) by mouth daily.   apixaban (ELIQUIS) 2.5 MG TABS tablet Take 1 tablet (2.5 mg total) by mouth 2 (two) times daily.   blood glucose meter kit and supplies KIT Dispense based on patient and insurance preference. Use one time daily as directed, E11.9   Calcium Carb-Cholecalciferol (CALCIUM 600 + D) 600-200 MG-UNIT TABS Take 1 tablet by mouth daily.    cetirizine (ZYRTEC) 10 MG tablet TAKE 1 TABLET BY MOUTH DAILY   cholecalciferol (VITAMIN D3) 25 MCG (1000 UT) tablet Take 1,000 Units by mouth daily.   ciprofloxacin (CIPRO) 500 MG tablet Take 1 tablet (500 mg total) by mouth 2 (two) times daily.   CRANBERRY PO Take 2 capsules by mouth daily.   cyanocobalamin (,VITAMIN B-12,) 1000 MCG/ML injection Inject 1,000 mcg into the muscle once a week. Then once a month x 4 months (Patient not taking: Reported on 11/10/2020)   Dulaglutide (TRULICITY) 1.5 XY/3.3XO SOPN Inject 4.5 mg into the skin once a week.   DULoxetine (CYMBALTA) 60 MG capsule Take 2 capsules (120 mg total) by mouth daily.   empagliflozin (JARDIANCE) 25 MG TABS tablet Take 1 tablet (25 mg total) by mouth daily. TAKE ONE TABLET 25MG EVERY DAY BEFORE BREAKFAST   gabapentin (NEURONTIN) 300 MG capsule Take 1 capsule (300 mg total) by mouth 3 (three) times daily.   glipiZIDE (GLUCOTROL) 5 MG tablet TAKE ONE TABLET BY MOUTH EVERY DAY BEFORE BREAKFAST   insulin degludec (TRESIBA FLEXTOUCH) 100 UNIT/ML FlexTouch Pen  Inject 28 Units into the skin at bedtime.   Insulin Pen Needle (PEN NEEDLES 3/16") 31G X 5 MM MISC 1 each by Does not apply route daily.   isosorbide mononitrate (IMDUR) 60 MG 24 hr tablet TAKE ONE TABLET BY MOUTH AT BEDTIME   Lactobacillus (PROBIOTIC ACIDOPHILUS PO) Take 1 capsule by mouth daily at 12 noon.   metoprolol succinate (TOPROL-XL) 50 MG 24 hr tablet TAKE 1 TABLET BY MOUTH DAILY WITH OR IMMEDIATLEY FOLLOWING A MEAL   montelukast (SINGULAIR) 10 MG tablet TAKE 1 TABLET BY MOUTH AT BEDTIME   MYRBETRIQ 50 MG TB24 tablet  TAKE 1 TABLET BY MOUTH DAILY   nitroGLYCERIN (NITROSTAT) 0.4 MG SL tablet Place 1 tablet (0.4 mg total) under the tongue every 5 (five) minutes as needed for chest pain. Total of 3 doses   pantoprazole (PROTONIX) 40 MG tablet Take 1 tablet (40 mg total) by mouth daily.   predniSONE (DELTASONE) 20 MG tablet Take 3 tablets (60 mg total) by mouth daily with breakfast.   Pumpkin Seed-Soy Germ (AZO BLADDER CONTROL/GO-LESS PO) Take 1 tablet by mouth in the morning and at bedtime.   rosuvastatin (CRESTOR) 40 MG tablet Take 1 tablet (40 mg total) by mouth daily.   senna (SENOKOT) 8.6 MG TABS tablet Take 8.6 mg by mouth daily as needed for mild constipation.   SYNTHROID 150 MCG tablet TAKE ONE TABLET ON AN EMPTY STOMACH WITHA GLASS OF WATER AT LEAST 30 TO 60 MINUTES BEFORE BREAKFAST   trimethoprim (TRIMPEX) 100 MG tablet TAKE ONE TABLET BY MOUTH EVERY DAY   TRULICITY 4.5 EG/3.1DV SOPN INJECT 4.5MG ONCE WEEKLY AS DIRECTED   UNABLE TO FIND Med Name: Energy, memory and mood enhancer   ZINC CITRATE PO Take by mouth.   No facility-administered encounter medications on file as of 02/22/2021.    Patient Active Problem List   Diagnosis Date Noted   Intermittent claudication ([place]) 02/15/2021   Lung nodule 08/19/2020   Fall 04/17/2020   Anemia 02/16/2020   Asthma 02/16/2020   History of cardiac catheterization 02/16/2020   History of left mastectomy 02/16/2020   Malignant tumor of breast ([place]) 02/16/2020   History of total knee arthroplasty 11/18/2019   Unstable angina ([place]) 04/03/2019   Dysphagia    Thickening of esophagus    Stomach irritation    Gastric polyp    Positive occult stool blood test    Polyp of colon    Hyperkalemia 11/27/2018   Proteinuria 11/27/2018   Type 2 diabetes mellitus with stage 3 chronic kidney disease ([place]) 11/27/2018   Carcinoma of lower-inner quadrant of left breast in female, estrogen receptor positive ([place]) 10/02/2018   Right knee pain 08/01/2017   Chronic  right-sided low back pain with right-sided sciatica 05/28/2017   Gait abnormality 05/28/2017   Hypothyroid 07/18/2016   COPD (chronic obstructive pulmonary disease) ([place]) 07/18/2016   Encephalomalacia on imaging study 12/10/2015   Calcification of both carotid arteries 12/10/2015   Benign neoplasm of sigmoid colon    Benign neoplasm of cecum    Benign neoplasm of ascending colon    Bilateral atelectasis 04/11/2015   Fatty liver disease, nonalcoholic 76/16/0737   Hydronephrosis of right kidney 04/11/2015   Abdominal aortic ectasia ([place]) 04/11/2015   Constipation 04/07/2015   Abdominal aortic atherosclerosis ([place]) 12/18/2014   Diverticulosis of colon 12/18/2014   Rhinitis, allergic 10/20/2014   Coronary artery disease    Essential hypertension    Morbid obesity due to excess calories ([place])  Spinal stenosis of lumbar region 06/06/2014   Herniated nucleus pulposus 06/06/2014   Neuralgia neuritis, sciatic nerve 05/24/2014   Pulmonary embolism ([place]) 01/30/2014   Benign hypertensive renal disease 12/16/2013   Uncontrolled type 2 diabetes mellitus with hyperglycemia, with long-term current use of insulin ([place]) 12/16/2013   Chronic kidney disease 12/16/2013   Frequent UTI 11/03/2013   Overactive bladder 11/03/2013   Coronary atherosclerosis of native coronary artery 06/19/2013   History of back surgery 06/19/2013   Hyperlipidemia 06/19/2013   PAD (peripheral artery disease) ([place]) 06/19/2013   Congenital obstruction of ureteropelvic junction 11/21/2011    Conditions to be addressed/monitored:CAD, HTN, HLD, COPD, DMII, and Chronic pain   Care Plan : RNCM: Chronic Pain (Adult)  Updates made by [name], RN since 02/22/2021 12:00 AM  Completed 02/22/2021   Problem: RNCM: Pain Management Plan (Chronic Pain) Resolved 02/22/2021  Priority: High     Long-Range Goal: RNCM: Pain Management Plan Developed Completed 02/22/2021  Start Date: 05/11/2020  Expected End Date: 10/07/2021   Recent Progress: On track  Priority: High  Note:   CARE PLAN ENTRY (see longitudinal plan of care for additional care plan information)  Current Barriers: resolving, duplicate goal Knowledge Deficits related to how to best control post operative knee replacement pain- right knee Chronic Disease Management support and education needs related to chronic pain and discomfort related to knee pain and post surgical knee replacement surgery and back pain  Nurse Case Manager Clinical Goal(s):  patient will verbalize understanding of plan for pain management for knee pain  patient will attend all scheduled medical appointments: recommended the patient call the office for a follow up patient will demonstrate improved adherence to prescribed treatment plan for pain relief of post surgical right knee pain as evidenced bypain relief and increased activity level  patient will demonstrate improved health management independence as evidenced byrecovery from knee replacement surgery   Interventions:  Inter-disciplinary care team collaboration (see longitudinal plan of care) Evaluation of current treatment plan related to pain control and patient's adherence to plan as established by provider. 05-11-2020: The patient states she is still having pain in her right knee with popping at times. Evaluation if she has dicussed with the surgeon and she has not. The patient will talk to the pcp today about this. The patient also has back pain and discomfort. Had a fall recently and went to the hospital to be evaluated. Had a UTI. Has finished course of abx.  10-12-2020: The patient is now experience thoracic pain and will see a specialist on 10-14-2020 for evaluation and recommendation. The patient is using voltaran gel but that does not help much. Recommended cold or heat application. The patient has not tried this yet. The patient states that she may try this. She says when she is sitting down it does not hurt. No correlation  to breathing. She is hopeful for answers on Thursday. 12-28-2020: The patient states her knee does not bother her it is her back and she cannot even life a gallon of milk without it feeling like it has a vise around it. The patient is compliant with medications and is aware of safety. Denies any new falls. Education and support given.  Advised patient to call provider for changes in level or intensity of pain and discomfort. 05-11-2020: Will discuss back pain and knee pain today with the provider. 10-12-2020: Saw pcp recently and follows up with specialist for thoracic pain on 10-14-2020. 12-28-2020: Advised to follow up with providers  as needed.  Provided education to patient re: alternative methods of pain control and to continue to work with PT to manage pain and discomfort. 12-28-2020: Review of alternate methods for pain control. Will continue to monitor.  Reviewed medications with patient and discussed patient states opioids make her sick on her stomach. Does not like to take them but will for uncontrolled pain.  Reviewed scheduled/upcoming provider appointments including: 12-28-2020: No upcoming appointments to see the pcp but know to call for changes.Recommended to call the office for a follow up since it has been a while since the patient has been seen by pcp.   Patient Self Care Activities:  Patient verbalizes understanding of plan to work with PT and call the provider for changes in pain level Attends all scheduled provider appointments Calls provider office for new concerns or questions - pain assessed= 10-12-2020: Still having a lot of unresolved pain and discomfort. Will see specialist this week. Is hopeful for some answers to her pain and what to do to relieve pain. 12-28-2020: The patient states she is managing her pain. - pain treatment goals reviewed - patient response to treatment assessed  Follow up at 02-22-2021     Care Plan : RNCM: Hypertension (Adult)  Updates made by [name], RN since 02/22/2021 12:00 AM  Completed 02/22/2021   Problem: RNCM: Hypertension (Hypertension) Resolved 02/22/2021  Priority: Medium     Long-Range Goal: RNCM: Hypertension Monitored Completed 02/22/2021  Start Date: 05/11/2020  Expected End Date: 10/07/2021  Recent Progress: On track  Priority: Medium  Note:   Objective: resolving, duplicate goal  Last practice recorded BP readings:  BP Readings from Last 3 Encounters:  12/22/20 (P) 128/68  11/10/20 125/70  10/28/20 133/82   Most recent eGFR/CrCl:  Lab Results  Component Value Date   EGFR 32 (L) 11/09/2020    No components found for: CRCL Current Barriers:  Knowledge Deficits related to basic understanding of hypertension pathophysiology and self care management Knowledge Deficits related to understanding of medications prescribed for management of hypertension Unable to independently manage HTN  Case Manager Clinical Goal(s):  patient will verbalize understanding of plan for hypertension management patient will demonstrate improved adherence to prescribed treatment plan for hypertension as evidenced by taking all medications as prescribed, monitoring and recording blood pressure as directed, adhering to low sodium/DASH diet patient will demonstrate improved health management independence as evidenced by checking blood pressure as directed and notifying PCP if SBP>160 or DBP > 90, taking all medications as prescribe, and adhering to a low sodium diet as discussed. patient will verbalize basic understanding of hypertension disease process and self health management plan as evidenced by compliance with medications, compliance with heart heatlhy/ADA diet and working with the CCM team to optimize health and well being.  Interventions:  Collaboration with [name], DO regarding development and update of comprehensive plan of care as evidenced by provider attestation and co-signature Inter-disciplinary care team collaboration  (see longitudinal plan of care) Evaluation of current treatment plan related to hypertension self management and patient's adherence to plan as established by provider. 12-28-2020: Denies any issues with her HTN. She states that her pressure is usually at 120/70.  Provided education to patient re: stroke prevention, s/s of heart attack and stroke, DASH diet, complications of uncontrolled blood pressure Reviewed medications with patient and discussed importance of compliance. 12-28-2020: The patient states she is compliant with her medications  Discussed plans with patient for ongoing care management follow up and  provided patient with direct contact information for care management team Advised patient, providing education and rationale, to monitor blood pressure daily and record, calling PCP for findings outside established parameters.  Self-Care Activities: - Self administers medications as prescribed Attends all scheduled provider appointments Calls provider office for new concerns, questions, or BP outside discussed parameters Checks BP and records as discussed Follows a low sodium diet/DASH diet Patient Goals: - check blood pressure weekly - choose a place to take my blood pressure (home, clinic or office, retail store) - write blood pressure results in a log or diary - agree on reward when goals are met - agree to work together to make changes - ask questions to understand - have a family meeting to talk about healthy habits - learn about high blood pressure  Follow Up Plan: Telephone follow up appointment with care management team member scheduled for: 02-22-2021 at 35 am    Care Plan : RNCM:Diabetes Type 2 (Adult)  Updates made by [name], RN since 02/22/2021 12:00 AM  Completed 02/22/2021   Problem: RNCM: Glycemic Management (Diabetes, Type 2) Resolved 02/22/2021  Priority: Medium  Note:   CARE PLAN ENTRY (see longtitudinal plan of care for additional care plan  information)  Objective:  Lab Results  Component Value Date   HGBA1C 7.7 (H) 11/11/2019   Lab Results  Component Value Date   CREATININE 1.61 (H) 11/27/2019   CREATININE 1.77 (H) 11/11/2019   CREATININE 1.49 (H) 09/02/2019   No results found for: EGFR  Current Barriers:  Knowledge Deficits related to basic Diabetes pathophysiology and self care/management Knowledge Deficits related to medications used for management of diabetes  Case Manager Clinical Goal(s):  Over the next 120 days, patient will demonstrate improved adherence to prescribed treatment plan for diabetes self care/management as evidenced by:  daily monitoring and recording of CBG  adherence to ADA/ carb modified diet exercise 3/4 days/week adherence to prescribed medication regimen  Interventions:  Provided education to patient about basic DM disease process Reviewed medications with patient and discussed importance of medication adherence Discussed plans with patient for ongoing care management follow up and provided patient with direct contact information for care management team Provided patient with written educational materials related to hypo and hyperglycemia and importance of correct treatment Reviewed scheduled/upcoming provider appointments including: 02-10-2020 at 2:20 pm Advised patient, providing education and rationale, to check cbg BID and record, calling pcp for findings outside established parameters.   Review of patient status, including review of consultants reports, relevant laboratory and other test results, and medications completed.  Patient Self Care Activities:  UNABLE to independently manage DM as evidence of hemoglobin A1C of 7.7 on 11-11-2019  Initial goal documentation     Long-Range Goal: RNCM: Glycemic Management Optimized Completed 02/22/2021  Start Date: 05/11/2020  Expected End Date: 08/28/2021  Recent Progress: On track  Priority: Medium  Note:   Objective: Resolving,  duplicate goal  Lab Results  Component Value Date   HGBA1C 7.9 (H) 08/19/2020    Lab Results  Component Value Date   CREATININE 1.66 (H) 11/09/2020   CREATININE 1.62 (H) 11/02/2020   CREATININE 1.66 (H) 10/28/2020    No results found for: EGFR Current Barriers:  Knowledge Deficits related to basic Diabetes pathophysiology and self care/management Knowledge Deficits related to medications used for management of diabetes Unable to independently manage DM as evidence of elevated A1C at times, 10-12-2020: The patient's most recent hemoglobin A1C 7.9 Case Manager Clinical Goal(s):  patient will demonstrate improved adherence to prescribed treatment plan for diabetes self care/management as evidenced by:  daily monitoring and recording of CBG  adherence to ADA/ carb modified diet adherence to prescribed medication regimen Interventions:  Provided education to patient about basic DM disease process Discussed plans with patient for ongoing care management follow up and provided patient with direct contact information for care management team Provided patient with written educational materials related to hypo and hyperglycemia and importance of correct treatment Reviewed scheduled/upcoming provider appointments including: no upcoming appointment with pcp. Knows to call for changes or needs Advised patient, providing education and rationale, to check cbg TID and record, calling pcp for findings outside established parameters.  05-11-2020: Blood sugars elevated since taking prednisone. States they are stabilized.  Denies any blood sugars >200.  States readings recently 118-140. 10-12-2020: The patient states that her blood sugars have been up and down recently. She has a current UTI and believes the medications she is taking is causing her to have elevated blood sugars. States the highest she has had is 171. Education and support given. Will continue to monitor for changes. 12-28-2020: The patient states  her blood sugars are up and down. The patient states that her blood sugar this am was 153.  States the range is 100 to 283. Knows when it is getting high. Extensive review of dietary restrictions and what the patient is eating. The patient has not been eating snacks. States that she thinks if she doesn't eat her blood sugars with go down. Education on how the body works with DM. Will send education information in the mail.  Review of patient status, including review of consultants reports, relevant laboratory and other test results, and medications completed. Patient Goals/Self-Care Activities , patient will:  - Checks blood sugars as prescribed and utilize hyper and hypoglycemia protocol as needed Adheres to prescribed ADA/carb modified Follow Up Plan:  Telephone follow up appointment with care management team member scheduled for: 02-22-2021 at 48 am    Care Plan : RNCM: COPD (Adult)  Updates made by [name], RN since 02/22/2021 12:00 AM  Completed 02/22/2021   Problem: RNCM: Psychological Adjustment to Diagnosis (COPD) Resolved 02/22/2021  Priority: Medium     Long-Range Goal: RNCM: COPD/Pneumonia/Lung nodule Completed 02/22/2021  Start Date: 05/11/2020  Expected End Date: 10/07/2021  Recent Progress: On track  Priority: Medium  Note:   Current Barriers: resolving, duplicate goal  Knowledge deficits related to basic understanding of COPD/pneumonia/ lung nodule disease process Knowledge deficits related to basic COPD/ lung nodule/pneumonia self care/management Knowledge deficit related to basic understanding of how to use inhalers and how inhaled medications work Knowledge deficit related to importance of energy conservation Unable to independently manage COPD/pneumonia and recent diagnosis of lung nodule Lacks social connections Unable to perform IADLs independently Does not contact provider office for questions/concerns  Case Manager Clinical Goal(s):  patient will report  using inhalers as prescribed including rinsing mouth after use patient will be able to verbalize understanding of COPD action plan and when to seek appropriate levels of medical care patient will engage in lite exercise as tolerated to build/regain stamina and strength and reduce shortness of breath through activity tolerance  patient will verbalize basic understanding of COPD disease process and self care activities  patient will not be hospitalized for COPD exacerbation as evidenced  Interventions:  Collaboration with [name], DO regarding development and update of comprehensive plan of care as evidenced by provider attestation and co-signature  Inter-disciplinary care team collaboration (see longitudinal plan of care) Provided patient with basic written and verbal COPD/Pneumonia/Lung nodule education on self care/management/and exacerbation prevention  Provided patient with COPD /Pneumonia/Lung nodule action plan and reinforced importance of daily self assessment. 10-12-2020: The patient states that she is doing about the same with her COPD, has seen the pulmonologist recently and has not had any changes in the plan of care. Will continue to monitor for needs. 12-28-2020: Denies any issues with her COPD. States her respiratory status is stable. Will continue to monitor.  Provided written and verbal instructions on pursed lip breathing and utilized returned demonstration as teach back Provided instruction about proper use of medications used for management of COPD including inhalers Advised patient to self assesses COPD/Pneumonia/Lung nodule  action plan zone and make appointment with provider if in the yellow zone for 48 hours without improvement. Provided patient with education about the role of exercise in the management of COPD Advised patient to engage in light exercise as tolerated 3-5 days a week Provided education about and advised patient to utilize infection prevention strategies to  reduce risk of respiratory infection  Patient Goals/Self-Care Activities:  - decision-making supported - depression screen reviewed - emotional support provided - family involvement promoted - problem-solving facilitated - relaxation techniques promoted - verbalization of feelings encouraged Follow Up Plan: Telephone follow up appointment with care management team member scheduled for: 02-22-2021 at 50 am    Care Plan : [facility]; Fall Risk (Adult)  Updates made by [name], RN since 02/22/2021 12:00 AM  Completed 02/22/2021   Problem: RNCM: Fall Risk Resolved 02/22/2021  Priority: High     Long-Range Goal: RNCM: Absence of Fall and Fall-Related Injury Completed 02/22/2021  Start Date: 05/11/2020  Expected End Date: 08/28/2021  Recent Progress: On track  Priority: High  Note:   Current Barriers: Resolving, duplicate goal  Knowledge Deficits related to fall precautions in patient with chronic pain, and several other chronic conditions impacting care  Decreased adherence to prescribed treatment for fall prevention Unable to perform IADLs independently Does not contact provider office for questions/concerns Chronic Disease Management support and education needs related to increased falls risk in patient with multiple chronic conditions  Clinical Goal(s):  patient will demonstrate improved adherence to prescribed treatment plan for decreasing falls as evidenced by patient reporting and review of EMR patient will verbalize using fall risk reduction strategies discussed patient will not experience additional falls patient will verbalize understanding of plan for effective management of falls prevention and safety in the home Interventions:  Collaboration with [name], DO regarding development and update of comprehensive plan of care as evidenced by provider attestation and co-signature Inter-disciplinary care team collaboration (see longitudinal plan of care) Provided  written and verbal education re: Potential causes of falls and Fall prevention strategies Reviewed medications and discussed potential side effects of medications such as dizziness and frequent urination Assessed for s/s of orthostatic hypotension Assessed for falls since last encounter. 10-12-2020: The patient denies any new falls at this time. The patient states that she is being very careful and monitoring her activity. Education and support given. Will continue to monitor. 12-28-2020: Denies any new falls at this time. Will continue to monitor. States she is being safe. Assessed patients knowledge of fall risk prevention secondary to previously provided education. Assessed working status of life alert bracelet and patient adherence Provided patient information for fall alert systems Evaluation of current treatment plan related to falls prevention and safety  and patient's adherence to plan as established by provider. 10-12-2020: The patient has a lot of health issues going on right now and is seeing several specialist. She is remaining optimistic and being safe in her home environment. Will continue to monitor. 12-28-2020: The patient is still dealing with a lot of back pain but she is stable and is being safe. Denies any new falls or safety concerns. Advised patient to call the office for new falls/changes in chronic conditions or questions Provided education to patient re: falls prevention and safety and monitoring for changes in conditions that may put patient at increase risk of falls Provided patient with falls prevention  educational materials related to preventing falls and being safe in home environment  Discussed plans with patient for ongoing care management follow up and provided patient with direct contact information for care management team Self-Care Deficits:  Lacks social connections Unable to perform IADLs independently Does not contact provider office for questions/concerns Patient  Goals:  - Utilize walker and wheelchair (assistive device) appropriately with all ambulation - De-clutter walkways - Change positions slowly - Wear secure fitting shoes at all times with ambulation - Utilize home lighting for dim lit areas - Demonstrate self and pet awareness at all times - activities of daily living skills assessed - assistive or adaptive device use encouraged - barriers to physical activity or exercise addressed - barriers to physical activity or exercise identified - barriers to safety identified - cognition assessed - cognitive-stimulating activities promoted - fall prevention plan reviewed and updated - fear of falling, loss of independence and pain acknowledged Follow Up Plan: Telephone follow up appointment with care management team member scheduled for: 02-22-2021 at 46 am     Care Plan : RNCM: General Plan of Care (Adult) for Chronic Disease Management and Care Coordination Needs  Updates made by [name], RN since 02/22/2021 12:00 AM     Problem: RNCM; Development of Plan of Care for Chronic Disease Management (DM, CAD, COPD, HTN, HLD, chronic pain)   Priority: High     Long-Range Goal: RNCM: Effective Management of Plan of Care for Chronic Disease Management (DM, CAD, COPD, HTN, HLD, chronic pain)   Start Date: 02/22/2021  Expected End Date: 02/22/2022  Priority: High  Note:   Current Barriers:  Knowledge Deficits related to plan of care for management of CAD, HTN, HLD, COPD, DMII, and chronic pain   Chronic Disease Management support and education needs related to CAD, HTN, HLD, COPD, DMII, and Chronic pain   RNCM Clinical Goal(s):  Patient will verbalize basic understanding of CAD, HTN, HLD, COPD, DMII, and Chronic pain  disease process and self health management plan as evidenced by keeping appointments, taking medications, following dietary restrictions, calling the office for changes in conditions, questions or concerns take all  medications exactly as prescribed and will call provider for medication related questions as evidenced by compliance with medications, and calling before needed refills     attend all scheduled medical appointments: 03-01-2021 at 11 am and with the specialist on 02-23-2021 as evidenced by keeping appointments and calling for schedule change needs         demonstrate improved and ongoing adherence to prescribed treatment plan for CAD, HTN, HLD, COPD, DMII, and Chronic pain  as evidenced by stable conditions and working with the CCM team to effectively manage chronic conditions  demonstrate a decrease in CAD, HTN, HLD, COPD, DMII, and Chronic pain  exacerbations  as evidenced by following the plan  of care and keeping the pcp and CCM team updated to changes in condition demonstrate ongoing self health care management ability to effectively manage chronic conditions  as evidenced by working with the CCM team  through collaboration with Consulting civil engineer, provider, and care team.   Interventions: 1:1 collaboration with primary care provider regarding development and update of comprehensive plan of care as evidenced by provider attestation and co-signature Inter-disciplinary care team collaboration (see longitudinal plan of care) Evaluation of current treatment plan related to  self management and patient's adherence to plan as established by provider   CAD  (Status: Goal on Track (progressing): YES.) Long Term Goal  Assessed understanding of CAD diagnosis Medications reviewed including medications utilized in CAD treatment plan Provided education on importance of blood pressure control in management of CAD; Provided education on Importance of limiting foods high in cholesterol; Counseled on importance of regular laboratory monitoring as prescribed; Counseled on the importance of exercise goals with target of 150 minutes per week Reviewed Importance of taking all medications as prescribed Reviewed  Importance of attending all scheduled provider appointments Advised to report any changes in symptoms or exercise tolerance  COPD: (Status: Goal on Track (progressing): YES.) Long Term Goal  Reviewed medications with patient, including use of prescribed maintenance and rescue inhalers, and provided instruction on medication management and the importance of adherence Provided patient with basic written and verbal COPD education on self care/management/and exacerbation prevention Advised patient to track and manage COPD triggers Provided written and verbal instructions on pursed lip breathing and utilized returned demonstration as teach back Provided instruction about proper use of medications used for management of COPD including inhalers Advised patient to self assesses COPD action plan zone and make appointment with provider if in the yellow zone for 48 hours without improvement Advised patient to engage in light exercise as tolerated 3-5 days a week to aid in the the management of COPD Provided education about and advised patient to utilize infection prevention strategies to reduce risk of respiratory infection Discussed the importance of adequate rest and management of fatigue with COPD Screening for signs and symptoms of depression related to chronic disease state  Assessed social determinant of health barriers  Diabetes:  (Status: Goal on Track (progressing): YES.) Long Term Goal   Lab Results  Component Value Date   HGBA1C 8.0 (H) 02/15/2021  Assessed patient's understanding of A1c goal: <7% Provided education to patient about basic DM disease process; Reviewed medications with patient and discussed importance of medication adherence;        Reviewed prescribed diet with patient heart healthy/ADA diet; Counseled on importance of regular laboratory monitoring as prescribed;        Discussed plans with patient for ongoing care management follow up and provided patient with direct  contact information for care management team;      Provided patient with written educational materials related to hypo and hyperglycemia and importance of correct treatment. 02-22-2021: The patient had blood sugar up to 506 after taking prednisone for headache pain. The patient has completed prednisone and is having better blood sugars. This am her blood sugar was 161, and yesterday it was 114.    Reviewed scheduled/upcoming provider appointments including: 03-01-2021 and 02-23-2021 with specialist ;         Advised patient, providing education and rationale, to check cbg before meals and at bedtime, when you have symptoms of low or high blood sugar, and before and after exercise and record. 02-22-2021: The patient  has been checking blood sugars regularly. Has been having ups and downs due to prednisone for temporal headache pain. The patient states that she is better today and sees the specialist tomorrow. The patient denies any acute findings with DM at this time.        call provider for findings outside established parameters;       Review of patient status, including review of consultants reports, relevant laboratory and other test results, and medications completed;       Advised patient to discuss Jardiance concern  with provider.  She was told by her nephrologist that [name] was not good for her kidneys. Advised the patient to discuss with pcp next week at her appointment.        Hyperlipidemia:  (Status: Goal on Track (progressing): YES.) Long Term Goal  Lab Results  Component Value Date   CHOL 161 02/15/2021   HDL 49 02/15/2021   LDLCALC 78 02/15/2021   TRIG 204 (H) 02/15/2021   CHOLHDL 3.2 09/07/2015     Medication review performed; medication list updated in electronic medical record.  Provider established cholesterol goals reviewed; Counseled on importance of regular laboratory monitoring as prescribed; Provided HLD educational materials; Reviewed role and benefits of statin for  ASCVD risk reduction; Discussed strategies to manage statin-induced myalgias; Reviewed importance of limiting foods high in cholesterol; Reviewed exercise goals and target of 150 minutes per week;  Hypertension: (Status: Goal on Track (progressing): YES.) Last practice recorded BP readings:  BP Readings from Last 3 Encounters:  02/18/21 108/67  02/15/21 114/68  01/17/21 133/72  Most recent eGFR/CrCl:  Lab Results  Component Value Date   EGFR 31 (L) 02/15/2021    No components found for: CRCL  Evaluation of current treatment plan related to hypertension self management and patient's adherence to plan as established by provider;   Provided education to patient re: stroke prevention, s/s of heart attack and stroke; Reviewed prescribed diet heart healthy/ADA diet Reviewed medications with patient and discussed importance of compliance;  Counseled on adverse effects of illicit drug and excessive alcohol use in patients with high blood pressure;  Counseled on the importance of exercise goals with target of 150 minutes per week Discussed plans with patient for ongoing care management follow up and provided patient with direct contact information for care management team; Advised patient, providing education and rationale, to monitor blood pressure daily and record, calling PCP for findings outside established parameters;  Provided education on prescribed diet heart healthy/ADA;  Discussed complications of poorly controlled blood pressure such as heart disease, stroke, circulatory complications, vision complications, kidney impairment, sexual dysfunction;   Pain:  (Status: Goal on Track (progressing): YES.) Long Term Goal  Pain assessment performed. 02-22-2021: The patient rates her head/temporal pain at 3 to 4 today. Has chronic back and knee pain also. The patient denies any acute findings. Is going to specialist tomorrow to have further evaluation of temporal pain that has been going on for  about 2 weeks in addition to other chronic pain areas.  Medications reviewed Reviewed provider established plan for pain management; Discussed importance of adherence to all scheduled medical appointments; Counseled on the importance of reporting any/all new or changed pain symptoms or management strategies to pain management provider; Advised patient to report to care team affect of pain on daily activities; Discussed use of relaxation techniques and/or diversional activities to assist with pain reduction (distraction, imagery, relaxation, massage, acupressure, TENS, heat, and cold application; Reviewed with patient prescribed pharmacological and  nonpharmacological pain relief strategies; Advised patient to discuss unresolved pain, changes in level or intensity of pain  with provider;  Patient Goals/Self-Care Activities: Take medications as prescribed   Attend all scheduled provider appointments Call pharmacy for medication refills 3-7 days in advance of running out of medications schedule appointment with eye doctor check blood sugar at prescribed times: before meals and at bedtime, when you have symptoms of low or high blood sugar, and before and after exercise check feet daily for cuts, sores or redness enter blood sugar readings and medication or insulin into daily log take the blood sugar log to all doctor visits take the blood sugar meter to all doctor visits trim toenails straight across drink 6 to 8 glasses of water each day eat fish at least once per week fill half of plate with vegetables manage portion size keep feet up while sitting wash and dry feet carefully every day wear comfortable, cotton socks wear comfortable, well-fitting shoes - avoid second hand smoke - eliminate smoking in my home - identify and avoid work-related triggers - identify and remove indoor air pollutants - limit outdoor activity during cold weather - listen for public air quality announcements  every day - do breathing exercises every day - eliminate symptom triggers at home - follow rescue plan if symptoms flare-up - use an extra pillow to sleep - develop a new routine to improve sleep - don't eat or exercise right before bedtime - eat healthy/prescribed diet: heart healthy/ADA - get at least 7 to 8 hours of sleep at night - use devices that will help like a cane, sock-puller or reacher - practice relaxation or meditation daily - do exercises in a comfortable position that makes breathing as easy as possible check blood pressure daily choose a place to take my blood pressure (home, clinic or office, retail store) write blood pressure results in a log or diary learn about high blood pressure keep a blood pressure log take blood pressure log to all doctor appointments call doctor for signs and symptoms of high blood pressure develop an action plan for high blood pressure keep all doctor appointments take medications for blood pressure exactly as prescribed begin an exercise program report new symptoms to your doctor eat more whole grains, fruits and vegetables, lean meats and healthy fats - call for medicine refill 2 or 3 days before it runs out - take all medications exactly as prescribed - call doctor with any symptoms you believe are related to your medicine - call doctor when you experience any new symptoms - go to all doctor appointments as scheduled       Plan:Telephone follow up appointment with care management team member scheduled for:  04-26-2021 at 76 am  [place], MSN, [facility] Mobile: [phone]

## 2021-02-22 NOTE — Patient Instructions (Signed)
Visit Information  Thank you for taking time to visit with me today. Please don't hesitate to contact me if I can be of assistance to you before our next scheduled telephone appointment.  Following are the goals we discussed today:  RNCM Clinical Goal(s):  Patient will verbalize basic understanding of CAD, HTN, HLD, COPD, DMII, and Chronic pain  disease process and self health management plan as evidenced by keeping appointments, taking medications, following dietary restrictions, calling the office for changes in conditions, questions or concerns take all medications exactly as prescribed and will call provider for medication related questions as evidenced by compliance with medications, and calling before needed refills     attend all scheduled medical appointments: 03-01-2021 at 11 am and with the specialist on 02-23-2021 as evidenced by keeping appointments and calling for schedule change needs         demonstrate improved and ongoing adherence to prescribed treatment plan for CAD, HTN, HLD, COPD, DMII, and Chronic pain  as evidenced by stable conditions and working with the CCM team to effectively manage chronic conditions  demonstrate a decrease in CAD, HTN, HLD, COPD, DMII, and Chronic pain  exacerbations  as evidenced by following the plan of care and keeping the pcp and CCM team updated to changes in condition demonstrate ongoing self health care management ability to effectively manage chronic conditions  as evidenced by working with the CCM team  through collaboration with Consulting civil engineer, provider, and care team.    Interventions: 1:1 collaboration with primary care provider regarding development and update of comprehensive plan of care as evidenced by provider attestation and co-signature Inter-disciplinary care team collaboration (see longitudinal plan of care) Evaluation of current treatment plan related to  self management and patient's adherence to plan as established by provider      CAD  (Status: Goal on Track (progressing): YES.) Long Term Goal  Assessed understanding of CAD diagnosis Medications reviewed including medications utilized in CAD treatment plan Provided education on importance of blood pressure control in management of CAD; Provided education on Importance of limiting foods high in cholesterol; Counseled on importance of regular laboratory monitoring as prescribed; Counseled on the importance of exercise goals with target of 150 minutes per week Reviewed Importance of taking all medications as prescribed Reviewed Importance of attending all scheduled provider appointments Advised to report any changes in symptoms or exercise tolerance   COPD: (Status: Goal on Track (progressing): YES.) Long Term Goal  Reviewed medications with patient, including use of prescribed maintenance and rescue inhalers, and provided instruction on medication management and the importance of adherence Provided patient with basic written and verbal COPD education on self care/management/and exacerbation prevention Advised patient to track and manage COPD triggers Provided written and verbal instructions on pursed lip breathing and utilized returned demonstration as teach back Provided instruction about proper use of medications used for management of COPD including inhalers Advised patient to self assesses COPD action plan zone and make appointment with provider if in the yellow zone for 48 hours without improvement Advised patient to engage in light exercise as tolerated 3-5 days a week to aid in the the management of COPD Provided education about and advised patient to utilize infection prevention strategies to reduce risk of respiratory infection Discussed the importance of adequate rest and management of fatigue with COPD Screening for signs and symptoms of depression related to chronic disease state  Assessed social determinant of health barriers   Diabetes:  (Status: Goal  on Track (  progressing): YES.) Long Term Goal         Lab Results  Component Value Date    HGBA1C 8.0 (H) 02/15/2021  Assessed patient's understanding of A1c goal: <7% Provided education to patient about basic DM disease process; Reviewed medications with patient and discussed importance of medication adherence;        Reviewed prescribed diet with patient heart healthy/ADA diet; Counseled on importance of regular laboratory monitoring as prescribed;        Discussed plans with patient for ongoing care management follow up and provided patient with direct contact information for care management team;      Provided patient with written educational materials related to hypo and hyperglycemia and importance of correct treatment. 02-22-2021: The patient had blood sugar up to 506 after taking prednisone for headache pain. The patient has completed prednisone and is having better blood sugars. This am her blood sugar was 161, and yesterday it was 114.    Reviewed scheduled/upcoming provider appointments including: 03-01-2021 and 02-23-2021 with specialist ;         Advised patient, providing education and rationale, to check cbg before meals and at bedtime, when you have symptoms of low or high blood sugar, and before and after exercise and record. 02-22-2021: The patient has been checking blood sugars regularly. Has been having ups and downs due to prednisone for temporal headache pain. The patient states that she is better today and sees the specialist tomorrow. The patient denies any acute findings with DM at this time.        call provider for findings outside established parameters;       Review of patient status, including review of consultants reports, relevant laboratory and other test results, and medications completed;       Advised patient to discuss Jardiance concern  with provider.  She was told by her nephrologist that [name] was not good for her kidneys. Advised the patient to discuss  with pcp next week at her appointment.         Hyperlipidemia:  (Status: Goal on Track (progressing): YES.) Long Term Goal       Lab Results  Component Value Date    CHOL 161 02/15/2021    HDL 49 02/15/2021    LDLCALC 78 02/15/2021    TRIG 204 (H) 02/15/2021    CHOLHDL 3.2 09/07/2015      Medication review performed; medication list updated in electronic medical record.  Provider established cholesterol goals reviewed; Counseled on importance of regular laboratory monitoring as prescribed; Provided HLD educational materials; Reviewed role and benefits of statin for ASCVD risk reduction; Discussed strategies to manage statin-induced myalgias; Reviewed importance of limiting foods high in cholesterol; Reviewed exercise goals and target of 150 minutes per week;   Hypertension: (Status: Goal on Track (progressing): YES.) Last practice recorded BP readings:     BP Readings from Last 3 Encounters:  02/18/21 108/67  02/15/21 114/68  01/17/21 133/72  Most recent eGFR/CrCl:       Lab Results  Component Value Date    EGFR 31 (L) 02/15/2021    No components found for: CRCL   Evaluation of current treatment plan related to hypertension self management and patient's adherence to plan as established by provider;   Provided education to patient re: stroke prevention, s/s of heart attack and stroke; Reviewed prescribed diet heart healthy/ADA diet Reviewed medications with patient and discussed importance of compliance;  Counseled on adverse effects of illicit drug and excessive alcohol  use in patients with high blood pressure;  Counseled on the importance of exercise goals with target of 150 minutes per week Discussed plans with patient for ongoing care management follow up and provided patient with direct contact information for care management team; Advised patient, providing education and rationale, to monitor blood pressure daily and record, calling PCP for findings outside established  parameters;  Provided education on prescribed diet heart healthy/ADA;  Discussed complications of poorly controlled blood pressure such as heart disease, stroke, circulatory complications, vision complications, kidney impairment, sexual dysfunction;    Pain:  (Status: Goal on Track (progressing): YES.) Long Term Goal  Pain assessment performed. 02-22-2021: The patient rates her head/temporal pain at 3 to 4 today. Has chronic back and knee pain also. The patient denies any acute findings. Is going to specialist tomorrow to have further evaluation of temporal pain that has been going on for about 2 weeks in addition to other chronic pain areas.  Medications reviewed Reviewed provider established plan for pain management; Discussed importance of adherence to all scheduled medical appointments; Counseled on the importance of reporting any/all new or changed pain symptoms or management strategies to pain management provider; Advised patient to report to care team affect of pain on daily activities; Discussed use of relaxation techniques and/or diversional activities to assist with pain reduction (distraction, imagery, relaxation, massage, acupressure, TENS, heat, and cold application; Reviewed with patient prescribed pharmacological and nonpharmacological pain relief strategies; Advised patient to discuss unresolved pain, changes in level or intensity of pain  with provider;   Patient Goals/Self-Care Activities: Take medications as prescribed   Attend all scheduled provider appointments Call pharmacy for medication refills 3-7 days in advance of running out of medications schedule appointment with eye doctor check blood sugar at prescribed times: before meals and at bedtime, when you have symptoms of low or high blood sugar, and before and after exercise check feet daily for cuts, sores or redness enter blood sugar readings and medication or insulin into daily log take the blood sugar log to all  doctor visits take the blood sugar meter to all doctor visits trim toenails straight across drink 6 to 8 glasses of water each day eat fish at least once per week fill half of plate with vegetables manage portion size keep feet up while sitting wash and dry feet carefully every day wear comfortable, cotton socks wear comfortable, well-fitting shoes - avoid second hand smoke - eliminate smoking in my home - identify and avoid work-related triggers - identify and remove indoor air pollutants - limit outdoor activity during cold weather - listen for public air quality announcements every day - do breathing exercises every day - eliminate symptom triggers at home - follow rescue plan if symptoms flare-up - use an extra pillow to sleep - develop a new routine to improve sleep - don't eat or exercise right before bedtime - eat healthy/prescribed diet: heart healthy/ADA - get at least 7 to 8 hours of sleep at night - use devices that will help like a cane, sock-puller or reacher - practice relaxation or meditation daily - do exercises in a comfortable position that makes breathing as easy as possible check blood pressure daily choose a place to take my blood pressure (home, clinic or office, retail store) write blood pressure results in a log or diary learn about high blood pressure keep a blood pressure log take blood pressure log to all doctor appointments call doctor for signs and symptoms of high blood pressure develop  an action plan for high blood pressure keep all doctor appointments take medications for blood pressure exactly as prescribed begin an exercise program report new symptoms to your doctor eat more whole grains, fruits and vegetables, lean meats and healthy fats - call for medicine refill 2 or 3 days before it runs out - take all medications exactly as prescribed - call doctor with any symptoms you believe are related to your medicine - call doctor when you  experience any new symptoms - go to all doctor appointments as scheduled          Our next appointment is by telephone on 04-26-2021 at 1030 am  Please call the care guide team at [phone] if you need to cancel or reschedule your appointment.   If you are experiencing a Mental Health or [place] or need someone to talk to, please call the Suicide and Crisis Lifeline: 988 call the [place] National Suicide Prevention Lifeline: (917) 668-8133 or TTY: 815 531 9191 TTY 4848245448) to talk to a trained counselor call 1-800-273-TALK (toll free, 24 hour hotline)   Patient verbalizes understanding of instructions provided today and agrees to view in [place].   Noreene Larsson RN, MSN, [facility] Mobile: [phone]

## 2021-02-23 ENCOUNTER — Ambulatory Visit (INDEPENDENT_AMBULATORY_CARE_PROVIDER_SITE_OTHER): Payer: Medicare Other | Admitting: Surgery

## 2021-02-23 ENCOUNTER — Other Ambulatory Visit: Payer: Self-pay

## 2021-02-23 ENCOUNTER — Encounter: Payer: Self-pay | Admitting: Surgery

## 2021-02-23 VITALS — BP 124/49 | HR 87 | Temp 98.3°F | Ht 65.0 in | Wt 216.0 lb

## 2021-02-23 DIAGNOSIS — I25110 Atherosclerotic heart disease of native coronary artery with unstable angina pectoris: Secondary | ICD-10-CM

## 2021-02-23 DIAGNOSIS — G44009 Cluster headache syndrome, unspecified, not intractable: Secondary | ICD-10-CM | POA: Diagnosis not present

## 2021-02-23 DIAGNOSIS — G8929 Other chronic pain: Secondary | ICD-10-CM

## 2021-02-23 DIAGNOSIS — R1031 Right lower quadrant pain: Secondary | ICD-10-CM | POA: Diagnosis not present

## 2021-02-23 NOTE — Progress Notes (Signed)
Patient ID: Tammy [name], female   DOB: 1945-10-14, 75 y.o.   MRN: [id number]  HPI Tammy [name] is a 75 y.o. female seen in consultation at the request of Dr. Wynetta Emery for temporal artery biopsy.  She has had chronic headaches for over a year in the right side and over the last month or so on the left.  She also associated with some visual disturbances.  She sees floaty's.  No loss of field of vision.  No neurological symptoms that are focal.  The pain is intermittent daily and throbbing.  Pain is moderate intensity and seems to be worsening when he applies pressure around the temples.  She denies any fevers and chills.  She does have a history of PE as well as emphysema and is on Eliquis. Prior appendectomy and cholecystectomy in the past. Does have a history of breast cancer status postlumpectomy and radiation therapy. She is accompanied by her husband.  She uses a cane for balance. Denies any fevers any chills.  She had a recent CT scan of the chest that I have personally reviewed showing emphysematous changes and a lung nodule that has decreased in size.  Her sedimentation rate was 12 CBC was normal and CMP shows chronic renal insufficiency with a creatinine of 1.7.  HPI  Past Medical History:  Diagnosis Date   Asthma    Benign neoplasm of colon    Breast cancer ([place]) 08/2018   left breast   Cervical disc disease    "bulging" - no limitations per pt   Chronic diastolic CHF (congestive heart failure) ([place])    a. 07/2012 Echo: Nl EF. mild inferoseptal HK, Gr 2 DD, mild conc LVH, mild PR/TR, mild PAH; b. 08/2019 Echo: EF 55-60%, no rwma, Gr1 DD. Nl RV size/fxn.   CKD (chronic kidney disease), stage III (HCC)    COPD (chronic obstructive pulmonary disease) (HCC)    Coronary artery disease    a. 11/2011 Cath: LAD 39md, LCX min irregs, RCA 70p, 109mith L->R collats, EF 60%-->Med Rx; b. 03/2019 Cath: LM nl, LAD 40-5060mCX large, mild diff dzs. OM1/2 nl. RCA known to be 100m61m->R collats from LAD &  LCX/OM.   Diabetes mellitus without complication (HCC)    Fusion of lumbar spine 12/22/2015   GERD (gastroesophageal reflux disease)    History of DVT (deep vein thrombosis)    History of tobacco abuse    a. Quit 2011.   Hyperlipidemia    Hypertension    Hypothyroidism    Lung cancer (HCC)[place] Obesity    Palpitations    Personal history of radiation therapy    PONV (postoperative nausea and vomiting)    Pulmonary embolus (HCC)[place]/15/2015   RLL. Presented with SOB and elevated d-dimer.    PVD (peripheral vascular disease) (HCC)[place] a. PTA of right leg and left femoral artery with stenosis; b. 09/2020 ABI: R 0.93, L 0.88 - unchanged from prior study.   [facility]   TIAs - no deficits    Past Surgical History:  Procedure Laterality Date   ANGIOPLASTY / STENTING FEMORAL     PVD; angioplasty right leg and left femoral artery with stenosis.    APPENDECTOMY  12/2014   BACK SURGERY  03/2012   nerve stimulator inserted   BACK SURGERY     screws, rods replaced with new hardware.   BACK SURGERY  11/2016   BREAST LUMPECTOMY Left 08/26/2018   BREAST SURGERY  CARDIAC CATHETERIZATION  12/07/2011   Mid LAD 50%, distal LAD 50%, mid RCA 70%, distal RCA 100%    CARDIAC CATHETERIZATION  01/04/2011   100% occluded mid RCA with good collaterals from distal LAD, normal LVEF.   CHOLECYSTECTOMY     COLONOSCOPY  2013   COLONOSCOPY WITH PROPOFOL N/A 05/24/2015   Procedure: COLONOSCOPY WITH PROPOFOL;  Surgeon: [name], MD;  Location: [place];  Service: Endoscopy;  Laterality: N/A;  Diabetic - oral meds   COLONOSCOPY WITH PROPOFOL N/A 01/28/2019   Procedure: COLONOSCOPY WITH PROPOFOL;  Surgeon: [name], MD;  Location: [facility];  Service: Endoscopy;  Laterality: N/A;   DIAGNOSTIC LAPAROSCOPY     ESOPHAGOGASTRODUODENOSCOPY (EGD) WITH PROPOFOL N/A 01/28/2019   Procedure: ESOPHAGOGASTRODUODENOSCOPY (EGD) WITH PROPOFOL;  Surgeon: [name], MD;   Location: [facility];  Service: Endoscopy;  Laterality: N/A;   LAPAROSCOPIC APPENDECTOMY N/A 01/06/2015   Procedure: APPENDECTOMY LAPAROSCOPIC;  Surgeon: [name], MD;  Location: [facility];  Service: General;  Laterality: N/A;   LEFT HEART CATH AND CORONARY ANGIOGRAPHY Left 04/04/2019   Procedure: LEFT HEART CATH AND CORONARY ANGIOGRAPHY;  Surgeon: [name], MD;  Location: [place] [facility];  Service: Cardiovascular;  Laterality: Left;   MASTECTOMY W/ SENTINEL NODE BIOPSY Left 08/26/2018   Procedure: PARTIAL MASTECTOMY WIDE EXCISION WITH SENTINEL LYMPH NODE BIOPSY LEFT;  Surgeon: [name], MD;  Location: [facility];  Service: General;  Laterality: Left;   POLYPECTOMY  05/24/2015   Procedure: POLYPECTOMY;  Surgeon: [name], MD;  Location: [place];  Service: Endoscopy;;   REPLACEMENT TOTAL KNEE Right    THYROIDECTOMY     TOTAL KNEE ARTHROPLASTY Right 11/13/2019   TRIGGER FINGER RELEASE      Family History  Problem Relation Age of Onset   Cancer Mother        colon   Hypertension Father    Heart disease Father    Heart attack Father    Diabetes Father    Cancer Sister        lung   COPD Sister    COPD Sister    Heart attack Brother    Diabetes Brother    Hypertension Brother    Heart disease Brother    Heart attack Brother    Diabetes Brother    Hypertension Brother    Heart disease Brother    Heart attack Brother    Diabetes Brother    Hypertension Brother    Heart disease Brother    Colon cancer Brother 65   Breast cancer Neg Hx     Social History Social History   Tobacco Use   Smoking status: Former    Packs/day: 0.50    Years: 15.00    Pack years: 7.50    Types: Cigarettes    Quit date: 03/14/2007    Years since quitting: 13.9   Smokeless tobacco: Never  Vaping Use   Vaping Use: Never used  Substance Use Topics   Alcohol use: No    Alcohol/week: 0.0 standard drinks   Drug use: Never    Allergies   Allergen Reactions   Hydrocodone Other (See Comments)    Unresponsive   Aleve [Naproxen Sodium]     Hives & itching    Contrast Media [Iodinated Diagnostic Agents]     Pt avoids due to kidney issues   Ioxaglate Other (See Comments)    (iodine) Pt avoids due to kidney issues    Oxycodone Other (  See Comments)    hallucinations   Ranexa [Ranolazine]     Sick on stomach   Sulfa Antibiotics Other (See Comments)    Pt avoids due to kidney issues   Tramadol Other (See Comments)    nightmares    Current Outpatient Medications  Medication Sig Dispense Refill   albuterol (VENTOLIN HFA) 108 (90 Base) MCG/ACT inhaler Inhale 2 puffs into the lungs every 6 (six) hours as needed for wheezing or shortness of breath. 8 g 6   anastrozole (ARIMIDEX) 1 MG tablet Take 1 tablet (1 mg total) by mouth daily. 90 tablet 1   apixaban (ELIQUIS) 2.5 MG TABS tablet Take 1 tablet (2.5 mg total) by mouth 2 (two) times daily. 180 tablet 1   blood glucose meter kit and supplies KIT Dispense based on patient and insurance preference. Use one time daily as directed, E11.9 1 each 0   Calcium Carb-Cholecalciferol (CALCIUM 600 + D) 600-200 MG-UNIT TABS Take 1 tablet by mouth daily.      cetirizine (ZYRTEC) 10 MG tablet TAKE 1 TABLET BY MOUTH DAILY 30 tablet 0   cholecalciferol (VITAMIN D3) 25 MCG (1000 UT) tablet Take 1,000 Units by mouth daily.     ciprofloxacin (CIPRO) 500 MG tablet Take 1 tablet (500 mg total) by mouth 2 (two) times daily. 14 tablet 0   CRANBERRY PO Take 2 capsules by mouth daily.     Dulaglutide (TRULICITY) 1.5 ZO/1.0RU SOPN Inject 4.5 mg into the skin once a week. 6 mL 6   DULoxetine (CYMBALTA) 60 MG capsule Take 2 capsules (120 mg total) by mouth daily. 180 capsule 1   empagliflozin (JARDIANCE) 25 MG TABS tablet Take 1 tablet (25 mg total) by mouth daily. TAKE ONE TABLET 25MG EVERY DAY BEFORE BREAKFAST 90 tablet 1   gabapentin (NEURONTIN) 300 MG capsule Take 1 capsule (300 mg total) by mouth 3  (three) times daily. 270 capsule 3   glipiZIDE (GLUCOTROL) 5 MG tablet TAKE ONE TABLET BY MOUTH EVERY DAY BEFORE BREAKFAST 90 tablet 1   insulin degludec (TRESIBA FLEXTOUCH) 100 UNIT/ML FlexTouch Pen Inject 28 Units into the skin at bedtime. 25.2 mL 1   Insulin Pen Needle (PEN NEEDLES 3/16") 31G X 5 MM MISC 1 each by Does not apply route daily. 100 each 12   isosorbide mononitrate (IMDUR) 60 MG 24 hr tablet TAKE ONE TABLET BY MOUTH AT BEDTIME 90 tablet 1   Lactobacillus (PROBIOTIC ACIDOPHILUS PO) Take 1 capsule by mouth daily at 12 noon.     metoprolol succinate (TOPROL-XL) 50 MG 24 hr tablet TAKE 1 TABLET BY MOUTH DAILY WITH OR IMMEDIATLEY FOLLOWING A MEAL 90 tablet 1   montelukast (SINGULAIR) 10 MG tablet TAKE 1 TABLET BY MOUTH AT BEDTIME 90 tablet 1   MYRBETRIQ 50 MG TB24 tablet TAKE 1 TABLET BY MOUTH DAILY 30 tablet 2   nitroGLYCERIN (NITROSTAT) 0.4 MG SL tablet Place 1 tablet (0.4 mg total) under the tongue every 5 (five) minutes as needed for chest pain. Total of 3 doses 25 tablet 6   pantoprazole (PROTONIX) 40 MG tablet Take 1 tablet (40 mg total) by mouth daily. 90 tablet 1   Pumpkin Seed-Soy Germ (AZO BLADDER CONTROL/GO-LESS PO) Take 1 tablet by mouth in the morning and at bedtime.     rosuvastatin (CRESTOR) 40 MG tablet Take 1 tablet (40 mg total) by mouth daily. 90 tablet 1   senna (SENOKOT) 8.6 MG TABS tablet Take 8.6 mg by mouth daily as needed for mild  constipation.     SYNTHROID 150 MCG tablet TAKE ONE TABLET ON AN EMPTY STOMACH WITHA GLASS OF WATER AT LEAST 30 TO 60 MINUTES BEFORE BREAKFAST 30 tablet 0   trimethoprim (TRIMPEX) 100 MG tablet TAKE ONE TABLET BY MOUTH EVERY DAY 90 tablet 1   UNABLE TO FIND Med Name: Energy, memory and mood enhancer     ZINC CITRATE PO Take by mouth.     No current facility-administered medications for this visit.     Review of Systems Full ROS  was asked and was negative except for the information on the HPI  Physical Exam Blood pressure (!)  124/49, pulse 87, temperature 98.3 F (36.8 C), height 5' 5" (1.651 m), weight 216 lb (98 kg), SpO2 96 %. CONSTITUTIONAL: NAD. EYES: Pupils are equal, round, and reactive to light, Sclera are non-icteric. There is evidence of tenderness to palpation bilateral zygomatic and temporal regions.  No evidence of external inflammatory changes. EARS, NOSE, MOUTH AND THROAT: She is wearing a mask hearing is intact to voice. LYMPH NODES:  Lymph nodes in the neck are normal. RESPIRATORY:  Lungs are clear. There is normal respiratory effort, with equal breath sounds bilaterally, and without pathologic use of accessory muscles. CARDIOVASCULAR: Heart is regular without murmurs, gallops, or rubs. GI: The abdomen is  soft, tender to palpation on the right lower quadrant without peritonitis.  No obvious ventral hernia defects.  And nondistended. There are no palpable masses. There is no hepatosplenomegaly. There are normal bowel sounds in all quadrants. GU: Rectal deferred.   MUSCULOSKELETAL: Normal muscle strength and tone. No cyanosis or edema.   SKIN: Turgor is good and there are no pathologic skin lesions or ulcers. NEUROLOGIC: Motor and sensation is grossly normal. Cranial nerves are grossly intact. PSYCH:  Oriented to person, place and time. Affect is normal.  Data Reviewed  I have personally reviewed the patient's imaging, laboratory findings and medical records.    Assessment/Plan 75 year old female with bilateral temporal headaches right worse than the left.  She is being worked up for giant cell arteritis.  Discussed with the patient in detail about performing a temporal artery biopsy.  She is currently on Eliquis and she will need to be off of that for 48 hours.  She did have a history of PE.  We can tentatively place her on the schedule for Monday.  Procedure discussed with the patient detail.  Risks, benefits and possible complications including but not limited to: Bleeding, infection, nondiagnostic  biopsy.  She understands and wished to proceed.  Regarding her abdominal pain she will like for me to further interrogate this and I do think that a CT scan of the abdomen pelvis will give [place] the best yield.  I will be concerned about a potential ventral hernia that might be causing her lower abdominal pain.  Please note that I have spent at least 45 minutes in this encounter including personally reviewing imaging studies, doing medication changes, placing orders, counseling the patient and performing appropriate documentation   Caroleen Hamman, MD FACS General Surgeon 02/23/2021, 1:46 PM

## 2021-02-23 NOTE — Patient Instructions (Addendum)
We will have you return on Monday morning for your biopsy. You will need to stop your Eliquis 48 hours prior to the procedure. You last dose will be on Friday morning.   We will get you scheduled for CT scan of your abdomen and pelvis for after the Holidays. We will call you about this appointment.   Temporal Arteritis Temporal arteritis is a condition that causes arteries to become inflamed. It usually affects arteries in your head and face, but arteries in any part of the body can become inflamed. The condition is also called giant cell arteritis.  Temporal arteritis can cause serious problems such as blindness. Early treatment can help prevent these problems. What are the causes? The cause of this condition is not known. What increases the risk? The following factors may make you more likely to develop this condition: Being older than 50. Being a woman. Being Caucasian. Being of [place], [place], [place], [place] or [place] ancestry. Having a family history of the condition. Having a certain condition that causes muscle pain and stiffness (polymyalgia rheumatica, PMR). What are the signs or symptoms? Some people with temporal arteritis have just one symptom, while others have several symptoms. Most symptoms are related to the head and face. These may include: Headache. Hard, swollen, or tender temples. This is common. Your temples are the areas on either side of your forehead. Pain when combing your hair or when laying your head down. Pain in the jaw when chewing. Pain in the throat or tongue. Problems with your vision, such as sudden loss of vision in one eye, or seeing double. Other symptoms may include: Fever. Tiredness (fatigue). A dry cough. Pain in the hips and shoulders. Pain in the arms during exercise. Depression. Weight loss. How is this diagnosed? This condition may be diagnosed based on: Your symptoms. Your medical history. A physical exam. Tests,  including: Blood tests. A test in which a tissue sample is removed from an artery so it can be examined (biopsy). Imaging tests, such as an ultrasound or MRI. How is this treated? This condition may be treated with: A type of medicine to reduce inflammation (corticosteroid). Medicines to weaken your immune system (immunosuppressants). Other medicines to treat vision problems. You will need to see your health care provider while you are being treated. The medicines used to treat this condition can increase your risk of problems such as bone loss and diabetes. During follow-up visits, your health care provider will check for problems by: Doing blood tests and bone density tests. Checking your blood pressure and blood sugar. Follow these instructions at home: Medicines Take over-the-counter and prescription medicines only as told by your health care provider. Take any vitamins or supplements recommended by your health care provider. These may include vitamin D and calcium, which help keep your bones from becoming weak. Eating and drinking  Eat a heart-healthy diet. This may include: Eating high-fiber foods, such as fresh fruits and vegetables, whole grains, and beans. Eating heart-healthy fats (omega-3 fats), such as fish, flaxseed, and flaxseed oil. Limiting foods that are high in saturated fat and cholesterol, such as processed and fried foods, fatty meat, and full-fat dairy. Limiting how much salt (sodium) you eat. Include calcium and vitamin D in your diet. Good sources of calcium and vitamin D include: Low-fat dairy products such as milk, yogurt, and cheese. Certain fish, such as fresh or canned salmon, tuna, and sardines. Products that have calcium and vitamin D added to them (fortified products), such as fortified  cereals or juice. General instructions Exercise. Talk with your health care provider about what exercises are okay for you. Exercises that increase your heart rate (aerobic  exercise), such as walking, are often recommended. Aerobic exercise helps control your blood pressure and prevent bone loss. Stay up to date on all vaccines as directed by your health care provider. Keep all follow-up visits as told by your health care provider. This is important. Contact a health care provider if: Your symptoms get worse. You develop signs of infection, such as fever, swelling, redness, warmth, and tenderness. Get help right away if: You lose your vision. Your pain does not go away, even after you take medicine. You have chest pain. You have trouble breathing. One side of your face or body suddenly becomes weak or numb. These symptoms may represent a serious problem that is an emergency. Do not wait to see if the symptoms will go away. Get medical help right away. Call your local emergency services (911 in the [place] Do not drive yourself to the hospital. Summary Temporal arteritis is a condition that causes arteries to become inflamed. It usually affects arteries in your head and face. This condition can cause serious problems, such as blindness. Treatment can help prevent these problems. Symptoms may include hard or tender temples, pain in your jaw when chewing, problems with your vision, or pain in your hips and shoulders. Take over-the-counter and prescription medicines as told by your health care provider. This information is not intended to replace advice given to you by your health care provider. Make sure you discuss any questions you have with your health care provider. Document Revised: 05/11/2020 Document Reviewed: 05/11/2020 Elsevier Patient Education  [place].

## 2021-02-24 ENCOUNTER — Telehealth: Payer: Self-pay

## 2021-02-24 NOTE — Telephone Encounter (Signed)
Spoke with the patient about her CT scan.  Patient has been scheduled for a CT abdomen/pelvis without contrast at [facility] on 03/22/21 her arrival time is 8:45 am and she will need to have nothing to eat for 4 hours prior. She will need to pick up a prep kit. Patient verbalizes understanding.

## 2021-02-28 ENCOUNTER — Other Ambulatory Visit: Payer: Self-pay | Admitting: Surgery

## 2021-02-28 ENCOUNTER — Encounter: Payer: Self-pay | Admitting: Surgery

## 2021-02-28 ENCOUNTER — Other Ambulatory Visit: Payer: Self-pay

## 2021-02-28 ENCOUNTER — Ambulatory Visit (INDEPENDENT_AMBULATORY_CARE_PROVIDER_SITE_OTHER): Payer: Medicare Other | Admitting: Surgery

## 2021-02-28 VITALS — BP 123/65 | HR 87 | Temp 98.3°F | Ht 65.0 in | Wt 218.0 lb

## 2021-02-28 DIAGNOSIS — G44009 Cluster headache syndrome, unspecified, not intractable: Secondary | ICD-10-CM

## 2021-02-28 NOTE — Progress Notes (Signed)
Dx: presumed temporal arteritis right  Procedure: Right temporal artery biopsy  Anesthesia: lidocaine 1% w epi  EBL: minimal  Complications: none   After informed consent was obtained the patient was placed in the supine position and was prepped and draped in the usual sterile fashion.  I did trim some of her hair from the right temple.  1% Xylocaine infiltrated, a 4-cm incision was made over the temporal artery. The temporal artery was identified and was grossly normal  Proximal and distal were ligated with both of 3-0 vicryl suture a. The specimen of temporal artery was taken from Right side measuring at least  3 cm. . The wound was then closed with interrupted 3-0 Monocryl subcuticular sutures and Dermabond. She tolerated the procedure well. Hemostasis obtained with pressure, no complications were observed.

## 2021-02-28 NOTE — Patient Instructions (Signed)
You may re-start the Eliquis tomorrow.  Do not get the area wet for 48 hours. After 48 hours you may shower but do not scrub on the area. If you notice bleeding-hold pressure for 5 minutes without peeking using a piece of gauze. If you continue to bleed go to the emergency room or call our office.  Place an ice pack on the area throughout the day to help with any swelling.

## 2021-03-01 ENCOUNTER — Ambulatory Visit (INDEPENDENT_AMBULATORY_CARE_PROVIDER_SITE_OTHER): Payer: Medicare Other | Admitting: Family Medicine

## 2021-03-01 ENCOUNTER — Encounter: Payer: Self-pay | Admitting: Family Medicine

## 2021-03-01 VITALS — BP 122/59 | HR 72 | Temp 97.8°F | Ht 65.0 in | Wt 216.8 lb

## 2021-03-01 DIAGNOSIS — Z794 Long term (current) use of insulin: Secondary | ICD-10-CM

## 2021-03-01 DIAGNOSIS — N3001 Acute cystitis with hematuria: Secondary | ICD-10-CM | POA: Diagnosis not present

## 2021-03-01 DIAGNOSIS — E1165 Type 2 diabetes mellitus with hyperglycemia: Secondary | ICD-10-CM

## 2021-03-01 DIAGNOSIS — R1031 Right lower quadrant pain: Secondary | ICD-10-CM | POA: Diagnosis not present

## 2021-03-01 DIAGNOSIS — R519 Headache, unspecified: Secondary | ICD-10-CM

## 2021-03-01 DIAGNOSIS — I25110 Atherosclerotic heart disease of native coronary artery with unstable angina pectoris: Secondary | ICD-10-CM | POA: Diagnosis not present

## 2021-03-01 LAB — MICROSCOPIC EXAMINATION

## 2021-03-01 LAB — URINALYSIS, ROUTINE W REFLEX MICROSCOPIC
Bilirubin, UA: NEGATIVE
Ketones, UA: NEGATIVE
Nitrite, UA: NEGATIVE
Specific Gravity, UA: 1.015 (ref 1.005–1.030)
Urobilinogen, Ur: 0.2 mg/dL (ref 0.2–1.0)
pH, UA: 5.5 (ref 5.0–7.5)

## 2021-03-01 NOTE — Assessment & Plan Note (Signed)
Will hold her jardiance for 1 month and recheck her kidney function. If no change, will continue her jardiance, if significantly better, will stop.

## 2021-03-01 NOTE — Progress Notes (Signed)
BP (!) 122/59    Pulse 72    Temp 97.8 F (36.6 C) (Oral)    Ht 5' 5" (1.651 m)    Wt 216 lb 12.8 oz (98.3 kg)    SpO2 98%    BMI 36.08 kg/m    Subjective:    Patient ID: Tammy [name], female    DOB: 01-30-1946, 75 y.o.   MRN: [id number]  HPI: Tammy [name] is a 75 y.o. female  Chief Complaint  Patient presents with   Temporal Pain   Had her temporal biopsy done yesterday. Feeling OK. Having some trouble opening her mouth. She has been feeling much better after stopping her prednisone. She notes that she has not had any changes in her vision. She is concerned about taking her jardiance with her kidney function.   Her urine is better. She continues with RLQ pain. Her surgeon ordered a CT scan that she is going to have in January. She has not been nauseous, no diarrhea, no vomiting. She has not had any burning when she pees or flank pain. No other concerns or complaints at this time.   Relevant past medical, surgical, family and social history reviewed and updated as indicated. Interim medical history since our last visit reviewed. Allergies and medications reviewed and updated.  Review of Systems  Constitutional: Negative.   Respiratory: Negative.    Cardiovascular: Negative.   Gastrointestinal:  Positive for abdominal pain. Negative for abdominal distention, anal bleeding, blood in stool, constipation, diarrhea, nausea, rectal pain and vomiting.  Musculoskeletal: Negative.   Skin: Negative.   Psychiatric/Behavioral: Negative.     Per HPI unless specifically indicated above     Objective:    BP (!) 122/59    Pulse 72    Temp 97.8 F (36.6 C) (Oral)    Ht 5' 5" (1.651 m)    Wt 216 lb 12.8 oz (98.3 kg)    SpO2 98%    BMI 36.08 kg/m   Wt Readings from Last 3 Encounters:  03/01/21 216 lb 12.8 oz (98.3 kg)  02/28/21 218 lb (98.9 kg)  02/23/21 216 lb (98 kg)    Physical Exam Vitals and nursing note reviewed.  Constitutional:      General: She is not in acute distress.     Appearance: Normal appearance. She is not ill-appearing, toxic-appearing or diaphoretic.  HENT:     Head: Normocephalic and atraumatic.     Right Ear: External ear normal.     Left Ear: External ear normal.     Nose: Nose normal.     Mouth/Throat:     Mouth: Mucous membranes are moist.     Pharynx: Oropharynx is clear.  Eyes:     General: No scleral icterus.       Right eye: No discharge.        Left eye: No discharge.     Extraocular Movements: Extraocular movements intact.     Conjunctiva/sclera: Conjunctivae normal.     Pupils: Pupils are equal, round, and reactive to light.  Cardiovascular:     Rate and Rhythm: Normal rate and regular rhythm.     Pulses: Normal pulses.     Heart sounds: Normal heart sounds. No murmur heard.   No friction rub. No gallop.  Pulmonary:     Effort: Pulmonary effort is normal. No respiratory distress.     Breath sounds: Normal breath sounds. No stridor. No wheezing, rhonchi or rales.  Chest:     Chest  wall: No tenderness.  Musculoskeletal:        General: Normal range of motion.     Cervical back: Normal range of motion and neck supple.  Skin:    General: Skin is warm and dry.     Capillary Refill: Capillary refill takes less than 2 seconds.     Coloration: Skin is not jaundiced or pale.     Findings: No bruising, erythema, lesion or rash.  Neurological:     General: No focal deficit present.     Mental Status: She is alert and oriented to person, place, and time. Mental status is at baseline.  Psychiatric:        Mood and Affect: Mood normal.        Behavior: Behavior normal.        Thought Content: Thought content normal.        Judgment: Judgment normal.    Results for orders placed or performed in visit on 02/15/21  Microscopic Examination   BLD  Result Value Ref Range   WBC, UA >30 (H) 0 - 5 /hpf   RBC 0-2 0 - 2 /hpf   Epithelial Cells (non renal) 0-10 0 - 10 /hpf   Mucus, UA Present (A) Not Estab.   Bacteria, UA Many (A) None  seen/Few   Yeast, UA Present (A) None seen  Bayer DCA Hb A1c Waived  Result Value Ref Range   HB A1C (BAYER DCA - WAIVED) 8.0 (H) 4.8 - 5.6 %  Comprehensive metabolic panel  Result Value Ref Range   Glucose 163 (H) 70 - 99 mg/dL   BUN 31 (H) 8 - 27 mg/dL   Creatinine, Ser 1.70 (H) 0.57 - 1.00 mg/dL   eGFR 31 (L) >59 mL/min/1.73   BUN/Creatinine Ratio 18 12 - 28   Sodium 139 134 - 144 mmol/L   Potassium 4.8 3.5 - 5.2 mmol/L   Chloride 100 96 - 106 mmol/L   CO2 23 20 - 29 mmol/L   Calcium 9.5 8.7 - 10.3 mg/dL   Total Protein 6.7 6.0 - 8.5 g/dL   Albumin 4.1 3.7 - 4.7 g/dL   Globulin, Total 2.6 1.5 - 4.5 g/dL   Albumin/Globulin Ratio 1.6 1.2 - 2.2   Bilirubin Total 0.3 0.0 - 1.2 mg/dL   Alkaline Phosphatase 90 44 - 121 IU/L   AST 22 0 - 40 IU/L   ALT 16 0 - 32 IU/L  Lipid Panel w/o Chol/HDL Ratio  Result Value Ref Range   Cholesterol, Total 161 100 - 199 mg/dL   Triglycerides 204 (H) 0 - 149 mg/dL   HDL 49 >39 mg/dL   VLDL Cholesterol Cal 34 5 - 40 mg/dL   LDL Chol Calc (NIH) 78 0 - 99 mg/dL  CBC with Differential/Platelet  Result Value Ref Range   WBC 8.6 3.4 - 10.8 x10E3/uL   RBC 4.89 3.77 - 5.28 x10E6/uL   Hemoglobin 13.2 11.1 - 15.9 g/dL   Hematocrit 42.2 34.0 - 46.6 %   MCV 86 79 - 97 fL   MCH 27.0 26.6 - 33.0 pg   MCHC 31.3 (L) 31.5 - 35.7 g/dL   RDW 13.6 11.7 - 15.4 %   Platelets 312 150 - 450 x10E3/uL   Neutrophils 55 Not Estab. %   Lymphs 28 Not Estab. %   Monocytes 12 Not Estab. %   Eos 3 Not Estab. %   Basos 1 Not Estab. %   Neutrophils Absolute 4.8 1.4 - 7.0 x10E3/uL  Lymphocytes Absolute 2.4 0.7 - 3.1 x10E3/uL   Monocytes Absolute 1.0 (H) 0.1 - 0.9 x10E3/uL   EOS (ABSOLUTE) 0.3 0.0 - 0.4 x10E3/uL   Basophils Absolute 0.1 0.0 - 0.2 x10E3/uL   Immature Granulocytes 1 Not Estab. %   Immature Grans (Abs) 0.1 0.0 - 0.1 x10E3/uL  Urinalysis, Routine w reflex microscopic  Result Value Ref Range   Specific Gravity, UA 1.020 1.005 - 1.030   pH, UA 5.0 5.0 -  7.5   Color, UA Yellow Yellow   Appearance Ur Cloudy (A) Clear   Leukocytes,UA 3+ (A) Negative   Protein,UA 1+ (A) Negative/Trace   Glucose, UA 3+ (A) Negative   Ketones, UA Negative Negative   RBC, UA 1+ (A) Negative   Bilirubin, UA Negative Negative   Urobilinogen, Ur 0.2 0.2 - 1.0 mg/dL   Nitrite, UA Negative Negative   Microscopic Examination See below:   Sedimentation rate  Result Value Ref Range   Sed Rate 12 0 - 40 mm/hr      Assessment & Plan:   Problem List Items Addressed This Visit       Endocrine   Uncontrolled type 2 diabetes mellitus with hyperglycemia, with long-term current use of insulin (HCC)    Will hold her jardiance for 1 month and recheck her kidney function. If no change, will continue her jardiance, if significantly better, will stop.       Other Visit Diagnoses     Temporal pain    -  Primary   No visual changes. Awaiting pathology results from TA bx. Appointment with Dr. Manuella Ghazi scheduled for 03/21/21. Call with any changes in vision. Continue to monitor.    Acute cystitis with hematuria       Rechecking urine today. Await results. Treat as needed.    Relevant Orders   Urinalysis, Routine w reflex microscopic   Urine Culture   RLQ abdominal pain       To have CT with general surgery. Await results. No better with treatment of UTI.        Follow up plan: Return in about 4 weeks (around 03/29/2021).

## 2021-03-01 NOTE — Patient Instructions (Signed)
Stop Jardiance for 1 month- we'll recheck your kidney function then

## 2021-03-02 ENCOUNTER — Other Ambulatory Visit: Payer: Self-pay | Admitting: Nurse Practitioner

## 2021-03-02 ENCOUNTER — Telehealth: Payer: Self-pay

## 2021-03-02 NOTE — Telephone Encounter (Signed)
Notified patient as instructed, patient pleased. Results have been forwarded to her primary care provider.

## 2021-03-02 NOTE — Telephone Encounter (Signed)
-----   Message from Jules Husbands, MD sent at 03/02/2021  9:48 AM EST ----- Please let the pt know her pathology did not show arteritis.Jinny Blossom thx for the referral. DP ----- Message ----- From: Interface, [facility] Sent: 03/02/2021   9:28 AM EST To: Jules Husbands, MD

## 2021-03-10 ENCOUNTER — Other Ambulatory Visit: Payer: Self-pay

## 2021-03-10 ENCOUNTER — Ambulatory Visit (INDEPENDENT_AMBULATORY_CARE_PROVIDER_SITE_OTHER): Payer: Medicare Other | Admitting: Surgery

## 2021-03-10 DIAGNOSIS — Z09 Encounter for follow-up examination after completed treatment for conditions other than malignant neoplasm: Secondary | ICD-10-CM

## 2021-03-10 DIAGNOSIS — G44009 Cluster headache syndrome, unspecified, not intractable: Secondary | ICD-10-CM

## 2021-03-10 LAB — URINE CULTURE

## 2021-03-10 NOTE — Progress Notes (Signed)
Referring provider:  [name], DO [place],  [place] [id number]  Virtual Visit via Telemedicine Note  I connected with Tammy [name] by telephone/me at /her cell on 03/10/21 at  9:00 AM EST and verified that I was speaking with the correct person using their name and two idenfiers/date of birth.   I discussed the limitations, risks, security and privacy concerns of performing an evaluation and management service by telephonic telemedicine and the availability of in person appointments. I also discussed with the patient that there may be a patient responsible charge related to this service. The patient expressed understanding and agreed to proceed.  History of Present Illness: S/p right temporal artery bx, path no evidence of arteritis. Informed pt about this, She has no complaints today.   Assessment:  75 y.o. yo Female with a problem list including...  Patient Active Problem List   Diagnosis Date Noted   Intermittent claudication ([place]) 02/15/2021   Lung nodule 08/19/2020   Fall 04/17/2020   Anemia 02/16/2020   Asthma 02/16/2020   History of cardiac catheterization 02/16/2020   History of left mastectomy 02/16/2020   Malignant tumor of breast ([place]) 02/16/2020   History of total knee arthroplasty 11/18/2019   Unstable angina ([place]) 04/03/2019   Dysphagia    Thickening of esophagus    Stomach irritation    Gastric polyp    Positive occult stool blood test    Polyp of colon    Hyperkalemia 11/27/2018   Proteinuria 11/27/2018   Type 2 diabetes mellitus with stage 3 chronic kidney disease ([place]) 11/27/2018   Carcinoma of lower-inner quadrant of left breast in female, estrogen receptor positive ([place]) 10/02/2018   Right knee pain 08/01/2017   Chronic right-sided low back pain with right-sided sciatica 05/28/2017   Gait abnormality 05/28/2017   Hypothyroid 07/18/2016   COPD (chronic obstructive pulmonary disease) ([place]) 07/18/2016   Encephalomalacia on imaging study 12/10/2015    Calcification of both carotid arteries 12/10/2015   Benign neoplasm of sigmoid colon    Benign neoplasm of cecum    Benign neoplasm of ascending colon    Bilateral atelectasis 04/11/2015   Fatty liver disease, nonalcoholic 60/73/7106   Hydronephrosis of right kidney 04/11/2015   Abdominal aortic ectasia ([place]) 04/11/2015   Constipation 04/07/2015   Abdominal aortic atherosclerosis ([place]) 12/18/2014   Diverticulosis of colon 12/18/2014   Rhinitis, allergic 10/20/2014   Coronary artery disease    Essential hypertension    Morbid obesity due to excess calories ([place])    Spinal stenosis of lumbar region 06/06/2014   Herniated nucleus pulposus 06/06/2014   Neuralgia neuritis, sciatic nerve 05/24/2014   Pulmonary embolism ([place]) 01/30/2014   Benign hypertensive renal disease 12/16/2013   Uncontrolled type 2 diabetes mellitus with hyperglycemia, with long-term current use of insulin ([place]) 12/16/2013   Chronic kidney disease 12/16/2013   Frequent UTI 11/03/2013   Overactive bladder 11/03/2013   Coronary atherosclerosis of native coronary artery 06/19/2013   History of back surgery 06/19/2013   Hyperlipidemia 06/19/2013   PAD (peripheral artery disease) ([place]) 06/19/2013   Congenital obstruction of ureteropelvic junction 11/21/2011    Follow-up Instructions / Plan:  Doing well after temporal artery bx w/o complications.             - patient advised she may be contacted to schedule a subsequent appointment/exam when such routine follow-up appointments may safely be performed, though patient may certainly decline a subsequent appointment if wishes to do so             -  otherwise, return to clinic as needed, instructed to call office if any questions or concerns All of the above recommendations were discussed with the patient, and all of patient's questions were answered to her expressed satisfaction.  The patient was advised to call back or seek an in-person evaluation if the symptoms worsen  or if the condition fails to improve as anticipated.  Judeth Horn, MD FACS

## 2021-03-11 ENCOUNTER — Ambulatory Visit: Payer: Self-pay

## 2021-03-11 ENCOUNTER — Telehealth (INDEPENDENT_AMBULATORY_CARE_PROVIDER_SITE_OTHER): Payer: Medicare Other | Admitting: Nurse Practitioner

## 2021-03-11 ENCOUNTER — Encounter: Payer: Self-pay | Admitting: Nurse Practitioner

## 2021-03-11 VITALS — BP 148/83 | HR 84 | Temp 95.6°F

## 2021-03-11 DIAGNOSIS — J069 Acute upper respiratory infection, unspecified: Secondary | ICD-10-CM | POA: Diagnosis not present

## 2021-03-11 MED ORDER — AZITHROMYCIN 250 MG PO TABS: ORAL_TABLET | ORAL | 0 refills | Status: DC

## 2021-03-11 NOTE — Progress Notes (Signed)
Acute Office Visit  Subjective:    Patient ID: Tammy [name], female    DOB: Aug 17, 1945, 75 y.o.   MRN: [id number]  Chief Complaint  Patient presents with   Nasal Congestion    Head congestion, sore throat, sinus pressure, cough, sneezing since yesterday.has taken Dola    HPI Patient is in today for cough, sore throat, and congestion since yesterday.   UPPER RESPIRATORY TRACT INFECTION  Worst symptom: nasal congestion Fever: no Cough: yes Shortness of breath: no Wheezing: yes Chest pain: yes, with cough Chest tightness: no Chest congestion: no Nasal congestion: yes Runny nose: yes Post nasal drip: yes Sneezing: yes Sore throat: yes Swollen glands: no Sinus pressure: yes Headache: yes Face pain: no Toothache: no Ear pain: no  Ear pressure: no  Eyes red/itching:no Eye drainage/crusting: no  Vomiting: no Rash: no Fatigue: yes Sick contacts: yes - granddaughter just diagnosed with RSV Strep contacts: no  Context: worse Recurrent sinusitis: no Relief with OTC cold/cough medications: no  Treatments attempted: cold ez, coricidin, sugar free ricola drops   Past Medical History:  Diagnosis Date   Asthma    Benign neoplasm of colon    Breast cancer ([place]) 08/2018   left breast   Cervical disc disease    "bulging" - no limitations per pt   Chronic diastolic CHF (congestive heart failure) ([place])    a. 07/2012 Echo: Nl EF. mild inferoseptal HK, Gr 2 DD, mild conc LVH, mild PR/TR, mild PAH; b. 08/2019 Echo: EF 55-60%, no rwma, Gr1 DD. Nl RV size/fxn.   CKD (chronic kidney disease), stage III (HCC)    COPD (chronic obstructive pulmonary disease) (HCC)    Coronary artery disease    a. 11/2011 Cath: LAD 60md, LCX min irregs, RCA 70p, 1074mith L->R collats, EF 60%-->Med Rx; b. 03/2019 Cath: LM nl, LAD 40-5069mCX large, mild diff dzs. OM1/2 nl. RCA known to be 100m67m->R collats from LAD & LCX/OM.   Diabetes mellitus without complication (HCC)    Fusion of lumbar spine  12/22/2015   GERD (gastroesophageal reflux disease)    History of DVT (deep vein thrombosis)    History of tobacco abuse    a. Quit 2011.   Hyperlipidemia    Hypertension    Hypothyroidism    Lung cancer (HCC)[place] Obesity    Palpitations    Personal history of radiation therapy    PONV (postoperative nausea and vomiting)    Pulmonary embolus (HCC)[place]/15/2015   RLL. Presented with SOB and elevated d-dimer.    PVD (peripheral vascular disease) (HCC)[place] a. PTA of right leg and left femoral artery with stenosis; b. 09/2020 ABI: R 0.93, L 0.88 - unchanged from prior study.   [facility]   TIAs - no deficits    Past Surgical History:  Procedure Laterality Date   ANGIOPLASTY / STENTING FEMORAL     PVD; angioplasty right leg and left femoral artery with stenosis.    APPENDECTOMY  12/2014   BACK SURGERY  03/2012   nerve stimulator inserted   BACK SURGERY     screws, rods replaced with new hardware.   BACK SURGERY  11/2016   BREAST LUMPECTOMY Left 08/26/2018   BREAST SURGERY     CARDIAC CATHETERIZATION  12/07/2011   Mid LAD 50%, distal LAD 50%, mid RCA 70%, distal RCA 100%    CARDIAC CATHETERIZATION  01/04/2011   100% occluded mid RCA with good collaterals from distal LAD,  normal LVEF.   CHOLECYSTECTOMY     COLONOSCOPY  2013   COLONOSCOPY WITH PROPOFOL N/A 05/24/2015   Procedure: COLONOSCOPY WITH PROPOFOL;  Surgeon: [name], MD;  Location: [facility];  Service: Endoscopy;  Laterality: N/A;  Diabetic - oral meds   COLONOSCOPY WITH PROPOFOL N/A 01/28/2019   Procedure: COLONOSCOPY WITH PROPOFOL;  Surgeon: [name], MD;  Location: [facility];  Service: Endoscopy;  Laterality: N/A;   DIAGNOSTIC LAPAROSCOPY     ESOPHAGOGASTRODUODENOSCOPY (EGD) WITH PROPOFOL N/A 01/28/2019   Procedure: ESOPHAGOGASTRODUODENOSCOPY (EGD) WITH PROPOFOL;  Surgeon: [name], MD;  Location: [facility];  Service: Endoscopy;  Laterality: N/A;   LAPAROSCOPIC  APPENDECTOMY N/A 01/06/2015   Procedure: APPENDECTOMY LAPAROSCOPIC;  Surgeon: [name], MD;  Location: [facility];  Service: General;  Laterality: N/A;   LEFT HEART CATH AND CORONARY ANGIOGRAPHY Left 04/04/2019   Procedure: LEFT HEART CATH AND CORONARY ANGIOGRAPHY;  Surgeon: [name], MD;  Location: [facility];  Service: Cardiovascular;  Laterality: Left;   MASTECTOMY W/ SENTINEL NODE BIOPSY Left 08/26/2018   Procedure: PARTIAL MASTECTOMY WIDE EXCISION WITH SENTINEL LYMPH NODE BIOPSY LEFT;  Surgeon: [name], MD;  Location: [facility];  Service: General;  Laterality: Left;   POLYPECTOMY  05/24/2015   Procedure: POLYPECTOMY;  Surgeon: [name], MD;  Location: [facility];  Service: Endoscopy;;   REPLACEMENT TOTAL KNEE Right    THYROIDECTOMY     TOTAL KNEE ARTHROPLASTY Right 11/13/2019   TRIGGER FINGER RELEASE      Family History  Problem Relation Age of Onset   Cancer Mother        colon   Hypertension Father    Heart disease Father    Heart attack Father    Diabetes Father    Cancer Sister        lung   COPD Sister    COPD Sister    Heart attack Brother    Diabetes Brother    Hypertension Brother    Heart disease Brother    Heart attack Brother    Diabetes Brother    Hypertension Brother    Heart disease Brother    Heart attack Brother    Diabetes Brother    Hypertension Brother    Heart disease Brother    Colon cancer Brother 51   Breast cancer Neg Hx     Social History   Socioeconomic History   Marital status: Married    Spouse name: Not on file   Number of children: 2   Years of education: some college, Audiological scientist for interior design    Highest education level: Associate degree: occupational, Hotel manager, or vocational program  Occupational History   Occupation: Retired  Tobacco Use   Smoking status: Former    Packs/day: 0.50    Years: 15.00    Pack years: 7.50    Types: Cigarettes    Quit date:  03/14/2007    Years since quitting: 14.0   Smokeless tobacco: Never  Vaping Use   Vaping Use: Never used  Substance and Sexual Activity   Alcohol use: No    Alcohol/week: 0.0 standard drinks   Drug use: Never   Sexual activity: Not Currently    Birth control/protection: Post-menopausal  Other Topics Concern   Not on file  Social History Narrative   Lives at home with husband in [place]. Quit smoking 10 years ago; never alcohol. 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    Social Determinants of Health   Financial Resource Strain: Low Risk    Difficulty of Paying Living Expenses: Not hard at all  Food Insecurity: No Food Insecurity   Worried About Charity fundraiser in the Last Year: Never true   [place] in the Last Year: Never true  Transportation Needs: No Transportation Needs   Lack of Transportation (Medical): No   Lack of Transportation (Non-Medical): No  Physical Activity: Inactive   Days of Exercise per Week: 0 days   Minutes of Exercise per Session: 0 min  Stress: No Stress Concern Present   Feeling of Stress : Not at all  Social Connections: Socially Integrated   Frequency of Communication with Friends and Family: More than three times a week   Frequency of Social Gatherings with Friends and Family: More than three times a week   Attends Religious Services: More than 4 times per year   Active Member of [employer] or Organizations: Yes   Attends Archivist Meetings: 1 to 4 times per year   Marital Status: Married  Human resources officer Violence: Not At Risk   Fear of Current or Ex-Partner: No   Emotionally Abused: No   Physically Abused: No   Sexually Abused: No    Outpatient Medications Prior to Visit  Medication Sig Dispense Refill   albuterol (VENTOLIN HFA) 108 (90 Base) MCG/ACT inhaler Inhale 2 puffs into the lungs every 6 (six) hours as needed for wheezing or shortness of breath. 8 g 6   anastrozole (ARIMIDEX) 1  MG tablet Take 1 tablet (1 mg total) by mouth daily. 90 tablet 1   apixaban (ELIQUIS) 2.5 MG TABS tablet Take 1 tablet (2.5 mg total) by mouth 2 (two) times daily. 180 tablet 1   blood glucose meter kit and supplies KIT Dispense based on patient and insurance preference. Use one time daily as directed, E11.9 1 each 0   Calcium Carb-Cholecalciferol (CALCIUM 600 + D) 600-200 MG-UNIT TABS Take 1 tablet by mouth daily.      cetirizine (ZYRTEC) 10 MG tablet TAKE 1 TABLET BY MOUTH DAILY 30 tablet 0   cholecalciferol (VITAMIN D3) 25 MCG (1000 UT) tablet Take 1,000 Units by mouth daily.     CRANBERRY PO Take 2 capsules by mouth daily.     Dulaglutide (TRULICITY) 1.5 TM/5.4YT SOPN Inject 4.5 mg into the skin once a week. 6 mL 6   DULoxetine (CYMBALTA) 60 MG capsule Take 2 capsules (120 mg total) by mouth daily. 180 capsule 1   empagliflozin (JARDIANCE) 25 MG TABS tablet Take 1 tablet (25 mg total) by mouth daily. TAKE ONE TABLET 25MG EVERY DAY BEFORE BREAKFAST 90 tablet 1   gabapentin (NEURONTIN) 300 MG capsule Take 1 capsule (300 mg total) by mouth 3 (three) times daily. 270 capsule 3   glipiZIDE (GLUCOTROL) 5 MG tablet TAKE ONE TABLET BY MOUTH EVERY DAY BEFORE BREAKFAST 90 tablet 1   insulin degludec (TRESIBA FLEXTOUCH) 100 UNIT/ML FlexTouch Pen Inject 28 Units into the skin at bedtime. 25.2 mL 1   Insulin Pen Needle (PEN NEEDLES 3/16") 31G X 5 MM MISC 1 each by Does not apply route daily. 100 each 12   isosorbide mononitrate (IMDUR) 60 MG 24 hr tablet TAKE ONE TABLET BY MOUTH AT BEDTIME 90 tablet 1   Lactobacillus (PROBIOTIC ACIDOPHILUS PO) Take 1 capsule by mouth daily at 12 noon.     metoprolol succinate (TOPROL-XL) 50 MG 24 hr  tablet TAKE 1 TABLET BY MOUTH DAILY WITH OR IMMEDIATLEY FOLLOWING A MEAL 90 tablet 1   montelukast (SINGULAIR) 10 MG tablet TAKE 1 TABLET BY MOUTH AT BEDTIME 90 tablet 1   MYRBETRIQ 50 MG TB24 tablet TAKE 1 TABLET BY MOUTH DAILY 30 tablet 2   nitroGLYCERIN (NITROSTAT) 0.4 MG SL  tablet Place 1 tablet (0.4 mg total) under the tongue every 5 (five) minutes as needed for chest pain. Total of 3 doses 25 tablet 6   pantoprazole (PROTONIX) 40 MG tablet Take 1 tablet (40 mg total) by mouth daily. 90 tablet 1   Pumpkin Seed-Soy Germ (AZO BLADDER CONTROL/GO-LESS PO) Take 1 tablet by mouth in the morning and at bedtime.     rosuvastatin (CRESTOR) 40 MG tablet Take 1 tablet (40 mg total) by mouth daily. 90 tablet 1   senna (SENOKOT) 8.6 MG TABS tablet Take 8.6 mg by mouth daily as needed for mild constipation.     SYNTHROID 150 MCG tablet TAKE ONE TABLET ON AN EMPTY STOMACH WITHA GLASS OF WATER AT LEAST 30 TO 60 MINUTES BEFORE BREAKFAST 30 tablet 0   trimethoprim (TRIMPEX) 100 MG tablet TAKE ONE TABLET BY MOUTH EVERY DAY 90 tablet 1   UNABLE TO FIND Med Name: Energy, memory and mood enhancer     ZINC CITRATE PO Take by mouth.     ciprofloxacin (CIPRO) 500 MG tablet Take 1 tablet (500 mg total) by mouth 2 (two) times daily. 14 tablet 0   No facility-administered medications prior to visit.    Allergies  Allergen Reactions   Hydrocodone Other (See Comments)    Unresponsive   Aleve [Naproxen Sodium]     Hives & itching    Contrast Media [Iodinated Contrast Media]     Pt avoids due to kidney issues   Ioxaglate Other (See Comments)    (iodine) Pt avoids due to kidney issues    Oxycodone Other (See Comments)    hallucinations   Ranexa [Ranolazine]     Sick on stomach   Sulfa Antibiotics Other (See Comments)    Pt avoids due to kidney issues   Tramadol Other (See Comments)    nightmares    Review of Systems  Constitutional:  Positive for fatigue. Negative for fever.  HENT:  Positive for congestion, postnasal drip, rhinorrhea, sinus pressure, sneezing and sore throat. Negative for ear pain.   Eyes: Negative.   Respiratory:  Positive for cough. Negative for shortness of breath.   Cardiovascular: Negative.   Gastrointestinal: Negative.   Endocrine: Negative.    Genitourinary: Negative.   Musculoskeletal:  Positive for myalgias.  Skin: Negative.   Neurological:  Positive for dizziness and headaches.  Psychiatric/Behavioral: Negative.        Objective:    Physical Exam Vitals and nursing note reviewed.  Pulmonary:     Comments: Able to talk in complete sentences Neurological:     Mental Status: She is oriented to person, place, and time.  Psychiatric:        Thought Content: Thought content normal.    BP (!) 148/83    Pulse 84    Temp (!) 95.6 F (35.3 C) (Oral)  Wt Readings from Last 3 Encounters:  03/01/21 216 lb 12.8 oz (98.3 kg)  02/28/21 218 lb (98.9 kg)  02/23/21 216 lb (98 kg)    Health Maintenance Due  Topic Date Due   TETANUS/TDAP  Never done   Zoster Vaccines- Shingrix (1 of 2) Never done   COVID-19  Vaccine (3 - Pfizer risk series) 06/26/2019   COLONOSCOPY (Pts 45-33yr Insurance coverage will need to be confirmed)  01/28/2020    There are no preventive care reminders to display for this patient.   Lab Results  Component Value Date   TSH 0.865 11/11/2019   Lab Results  Component Value Date   WBC 8.6 02/15/2021   HGB 13.2 02/15/2021   HCT 42.2 02/15/2021   MCV 86 02/15/2021   PLT 312 02/15/2021   Lab Results  Component Value Date   NA 139 02/15/2021   K 4.8 02/15/2021   CO2 23 02/15/2021   GLUCOSE 163 (H) 02/15/2021   BUN 31 (H) 02/15/2021   CREATININE 1.70 (H) 02/15/2021   BILITOT 0.3 02/15/2021   ALKPHOS 90 02/15/2021   AST 22 02/15/2021   ALT 16 02/15/2021   PROT 6.7 02/15/2021   ALBUMIN 4.1 02/15/2021   CALCIUM 9.5 02/15/2021   ANIONGAP 10 01/17/2021   EGFR 31 (L) 02/15/2021   Lab Results  Component Value Date   CHOL 161 02/15/2021   Lab Results  Component Value Date   HDL 49 02/15/2021   Lab Results  Component Value Date   LDLCALC 78 02/15/2021   Lab Results  Component Value Date   TRIG 204 (H) 02/15/2021   Lab Results  Component Value Date   CHOLHDL 3.2 09/07/2015   Lab  Results  Component Value Date   HGBA1C 8.0 (H) 02/15/2021       Assessment & Plan:   Problem List Items Addressed This Visit   None Visit Diagnoses     Upper respiratory tract infection, unspecified type    -  Primary   Unable to get ride for covid-19/flu test. Encourage home covid-19 test. With wheezing, will treat with z-pak, rest, fluids. Did not tolerate prednsione in past.   Relevant Medications   azithromycin (ZITHROMAX Z-PAK) 250 MG tablet       Meds ordered this encounter  Medications   azithromycin (ZITHROMAX Z-PAK) 250 MG tablet    Sig: Take 2 tablets today, then 1 tablet daily until gone    Dispense:  6 each    Refill:  0    This visit was completed via telephone due to the restrictions of the COVID-19 pandemic. All issues as above were discussed and addressed but no physical exam was performed. If it was felt that the patient should be evaluated in the office, they were directed there. The patient verbally consented to this visit. Patient was unable to complete an audio/visual visit due to Technical difficulties, Lack of internet. Due to the catastrophic nature of the COVID-19 pandemic, this visit was done through audio contact only. Location of the patient: home Location of the provider: work Those involved with this call:  Provider: LVance Peper NP CMA: TFrazier Butt CMildredDesk/Registration: CMyrlene Broker Time spent on call:  10 minutes on the phone discussing health concerns. 10 minutes total spent in review of patient's record and preparation of their chart.   LCharyl Dancer NP

## 2021-03-11 NOTE — Telephone Encounter (Signed)
Summary: cold and flu like symptoms / rx request   The patient is currently experiencing sneezing, coughing, a sore throat and a headache   The patient has also previously experienced chills and an elevated temperature '   The patient has experienced symptoms since 03/10/21   The patient would like to be prescribed something for their discomfort   Please contact further     Left message to call back about symptoms.

## 2021-03-11 NOTE — Telephone Encounter (Signed)
Chief Complaint: Cold and Flu symptoms Symptoms: Cough, sneezing, sore throat, headache, sinus pain (teeth, jaw), nasal congestion, runny nose Frequency: 03/10/21, headache a couple weeks Pertinent Negatives: Patient denies SOB except on exertion, denies CP except when coughing Disposition: [] ED /[] Urgent Care (no appt availability in office) / [x] Appointment(In office/virtual)/ []  [facility]/ [] Home Care/ [] Refused Recommended Disposition /[] [facility]/ []  Follow-up with PCP Additional Notes: N/A   Summary: cold and flu like symptoms / rx request   Pt returning call.  Unable to reach NT.  Pt aware nurse will call back   ----- Message from Marlis Edelson sent at 03/11/2021  8:46 AM EST -----  The patient is currently experiencing sneezing, coughing, a sore throat and a headache   The patient has also previously experienced chills and an elevated temperature '   The patient has experienced symptoms since 03/10/21   The patient would like to be prescribed something for their discomfort   Please contact further      Reason for Disposition  [1] Sinus pain (not just congestion) AND [2] fever  Answer Assessment - Initial Assessment Questions 1. LOCATION: "Where does it hurt?"      Eyes, jawbone, teeth on the left, slight headache 2. ONSET: "When did the sinus pain start?"  (e.g., hours, days)      03/10/21 3. SEVERITY: "How bad is the pain?"   (Scale 1-10; mild, moderate or severe)   - MILD (1-3): doesn't interfere with normal activities    - MODERATE (4-7): interferes with normal activities (e.g., work or school) or awakens from sleep   - SEVERE (8-10): excruciating pain and patient unable to do any normal activities        5 4. RECURRENT SYMPTOM: "Have you ever had sinus problems before?" If Yes, ask: "When was the last time?" and "What happened that time?"      N/A 5. NASAL CONGESTION: "Is the nose blocked?" If Yes, ask: "Can you open it or must you  breathe through your mouth?"     Gets blocked at night 6. NASAL DISCHARGE: "Do you have discharge from your nose?" If so ask, "What color?"     clear 7. FEVER: "Do you have a fever?" If Yes, ask: "What is it, how was it measured, and when did it start?"      Don't feel hot 8. OTHER SYMPTOMS: "Do you have any other symptoms?" (e.g., sore throat, cough, earache, difficulty breathing)     Sore throat, cough/wheezing, sneezing, SOB with exertion, headache 9. PREGNANCY: "Is there any chance you are pregnant?" "When was your last menstrual period?"     No  Protocols used: Sinus Pain or Congestion-A-AH

## 2021-03-12 ENCOUNTER — Encounter: Payer: Self-pay | Admitting: Family Medicine

## 2021-03-13 DIAGNOSIS — Z79891 Long term (current) use of opiate analgesic: Secondary | ICD-10-CM | POA: Insufficient documentation

## 2021-03-17 ENCOUNTER — Other Ambulatory Visit: Payer: Self-pay | Admitting: Family Medicine

## 2021-03-17 NOTE — Telephone Encounter (Signed)
Requested medication (s) are due for refill today: yes  Requested medication (s) are on the active medication list: yes  Last refill:  11/30/20  Future visit scheduled: 03/23/21  Notes to clinic:  this med has no protocol to follow, please assess.  Requested Prescriptions  Pending Prescriptions Disp Refills   trimethoprim (TRIMPEX) 100 MG tablet [Pharmacy Med Name: TRIMETHOPRIM 100 MG TAB] 90 tablet 1    Sig: TAKE ONE TABLET BY MOUTH EVERY DAY     Off-Protocol Failed - 03/17/2021 10:53 AM      Failed - Medication not assigned to a protocol, review manually.      Passed - Valid encounter within last 12 months    Recent Outpatient Visits           6 days ago Upper respiratory tract infection, unspecified type   [facility], Lauren A, NP   2 weeks ago Temporal pain   [place], Megan P, DO   3 weeks ago Temporal pain   [place], Megan P, DO   1 month ago Type 2 diabetes mellitus with stage 3 chronic kidney disease, with long-term current use of insulin, unspecified whether stage 3a or 3b CKD ([place])   [place], Megan P, DO   4 months ago Acute cystitis with hematuria   [place], DO       Future Appointments             In 6 days Wynetta Emery, Barb Merino, DO [place]   In 9 months  [employer], [place]

## 2021-03-21 ENCOUNTER — Telehealth: Payer: Self-pay

## 2021-03-21 ENCOUNTER — Telehealth: Payer: Medicare Other

## 2021-03-21 DIAGNOSIS — M5416 Radiculopathy, lumbar region: Secondary | ICD-10-CM | POA: Diagnosis not present

## 2021-03-21 DIAGNOSIS — Z8673 Personal history of transient ischemic attack (TIA), and cerebral infarction without residual deficits: Secondary | ICD-10-CM | POA: Diagnosis not present

## 2021-03-21 DIAGNOSIS — H532 Diplopia: Secondary | ICD-10-CM | POA: Diagnosis not present

## 2021-03-21 DIAGNOSIS — E538 Deficiency of other specified B group vitamins: Secondary | ICD-10-CM | POA: Diagnosis not present

## 2021-03-22 ENCOUNTER — Ambulatory Visit
Admission: RE | Admit: 2021-03-22 | Discharge: 2021-03-22 | Disposition: A | Discharge status: Home / Self Care | Payer: Medicare Other | Source: Ambulatory Visit | Attending: Surgery | Admitting: Surgery

## 2021-03-22 ENCOUNTER — Other Ambulatory Visit: Payer: Self-pay

## 2021-03-22 ENCOUNTER — Telehealth: Payer: Self-pay

## 2021-03-22 DIAGNOSIS — G8929 Other chronic pain: Secondary | ICD-10-CM | POA: Insufficient documentation

## 2021-03-22 DIAGNOSIS — K5730 Diverticulosis of large intestine without perforation or abscess without bleeding: Secondary | ICD-10-CM | POA: Diagnosis not present

## 2021-03-22 DIAGNOSIS — R1031 Right lower quadrant pain: Secondary | ICD-10-CM | POA: Diagnosis not present

## 2021-03-22 NOTE — Telephone Encounter (Signed)
Pt notified about the results of CT scan. Verbalizes understanding. Appt has been scheduled.

## 2021-03-23 ENCOUNTER — Ambulatory Visit (INDEPENDENT_AMBULATORY_CARE_PROVIDER_SITE_OTHER): Payer: Medicare Other | Admitting: Family Medicine

## 2021-03-23 ENCOUNTER — Encounter: Payer: Self-pay | Admitting: Family Medicine

## 2021-03-23 ENCOUNTER — Other Ambulatory Visit: Payer: Self-pay

## 2021-03-23 VITALS — BP 103/70 | HR 76 | Temp 97.7°F | Wt 216.0 lb

## 2021-03-23 DIAGNOSIS — E039 Hypothyroidism, unspecified: Secondary | ICD-10-CM

## 2021-03-23 DIAGNOSIS — E1165 Type 2 diabetes mellitus with hyperglycemia: Secondary | ICD-10-CM

## 2021-03-23 DIAGNOSIS — Z794 Long term (current) use of insulin: Secondary | ICD-10-CM | POA: Diagnosis not present

## 2021-03-23 NOTE — Progress Notes (Signed)
BP 103/70    Pulse 76    Temp 97.7 F (36.5 C)    Wt 216 lb (98 kg)    SpO2 95%    BMI 35.94 kg/m    Subjective:    Patient ID: Tammy [name], female    DOB: April 11, 1945, 76 y.o.   MRN: [id number]  HPI: Tammy [name] is a 76 y.o. female  Chief Complaint  Patient presents with   Diabetes   DIABETES Hypoglycemic episodes:no Polydipsia/polyuria: no Visual disturbance: no Chest pain: no Paresthesias: no Glucose Monitoring: no  Accucheck frequency: Not Checking Taking Insulin?: yes Blood Pressure Monitoring: not checking Retinal Examination: Not up to Date Foot Exam: Up to Date Diabetic Education: Completed Pneumovax: Up to Date Influenza: Up to Date Aspirin: no   Relevant past medical, surgical, family and social history reviewed and updated as indicated. Interim medical history since our last visit reviewed. Allergies and medications reviewed and updated.  Review of Systems  Constitutional: Negative.   Respiratory: Negative.    Cardiovascular: Negative.   Gastrointestinal: Negative.   Musculoskeletal: Negative.   Psychiatric/Behavioral: Negative.     Per HPI unless specifically indicated above     Objective:    BP 103/70    Pulse 76    Temp 97.7 F (36.5 C)    Wt 216 lb (98 kg)    SpO2 95%    BMI 35.94 kg/m   Wt Readings from Last 3 Encounters:  03/23/21 216 lb (98 kg)  03/01/21 216 lb 12.8 oz (98.3 kg)  02/28/21 218 lb (98.9 kg)    Physical Exam Vitals and nursing note reviewed.  Constitutional:      General: She is not in acute distress.    Appearance: Normal appearance. She is not ill-appearing, toxic-appearing or diaphoretic.  HENT:     Head: Normocephalic and atraumatic.     Right Ear: External ear normal.     Left Ear: External ear normal.     Nose: Nose normal.     Mouth/Throat:     Mouth: Mucous membranes are moist.     Pharynx: Oropharynx is clear.  Eyes:     General: No scleral icterus.       Right eye: No discharge.        Left eye: No  discharge.     Extraocular Movements: Extraocular movements intact.     Conjunctiva/sclera: Conjunctivae normal.     Pupils: Pupils are equal, round, and reactive to light.  Cardiovascular:     Rate and Rhythm: Normal rate and regular rhythm.     Pulses: Normal pulses.     Heart sounds: Normal heart sounds. No murmur heard.   No friction rub. No gallop.  Pulmonary:     Effort: Pulmonary effort is normal. No respiratory distress.     Breath sounds: Normal breath sounds. No stridor. No wheezing, rhonchi or rales.  Chest:     Chest wall: No tenderness.  Musculoskeletal:        General: Normal range of motion.     Cervical back: Normal range of motion and neck supple.  Skin:    General: Skin is warm and dry.     Capillary Refill: Capillary refill takes less than 2 seconds.     Coloration: Skin is not jaundiced or pale.     Findings: No bruising, erythema, lesion or rash.  Neurological:     General: No focal deficit present.     Mental Status: She is alert and  oriented to person, place, and time. Mental status is at baseline.  Psychiatric:        Mood and Affect: Mood normal.        Behavior: Behavior normal.        Thought Content: Thought content normal.        Judgment: Judgment normal.    Results for orders placed or performed in visit on 03/01/21  Urine Culture   Specimen: Urine   UR  Result Value Ref Range   Urine Culture, Routine Final report (A)    Organism ID, Bacteria Candida krusei (A)    ORGANISM ID, BACTERIA Lactobacillus species   Microscopic Examination   Urine  Result Value Ref Range   WBC, UA 11-30 (A) 0 - 5 /hpf   RBC 3-10 (A) 0 - 2 /hpf   Epithelial Cells (non renal) 0-10 0 - 10 /hpf   Mucus, UA Present (A) Not Estab.   Bacteria, UA Many (A) None seen/Few   Yeast, UA Present (A) None seen  Urinalysis, Routine w reflex microscopic  Result Value Ref Range   Specific Gravity, UA 1.015 1.005 - 1.030   pH, UA 5.5 5.0 - 7.5   Color, UA Yellow Yellow    Appearance Ur Cloudy (A) Clear   Leukocytes,UA Trace (A) Negative   Protein,UA Trace (A) Negative/Trace   Glucose, UA 3+ (A) Negative   Ketones, UA Negative Negative   RBC, UA 2+ (A) Negative   Bilirubin, UA Negative Negative   Urobilinogen, Ur 0.2 0.2 - 1.0 mg/dL   Nitrite, UA Negative Negative   Microscopic Examination See below:       Assessment & Plan:   Problem List Items Addressed This Visit       Endocrine   Uncontrolled type 2 diabetes mellitus with hyperglycemia, with long-term current use of insulin (HCC) - Primary    Rechecking BMP today after being off jardiance for a month. If kidney function stable, will continue. If kidney function significantly better, we will stop.       Relevant Orders   Basic metabolic panel   Hypothyroid    Rechecking labs today. Await results. Treat as needed.       Relevant Orders   TSH     Follow up plan: Return in about 3 months (around 06/21/2021).

## 2021-03-23 NOTE — Assessment & Plan Note (Signed)
Rechecking labs today. Await results. Treat as needed.  °

## 2021-03-23 NOTE — Assessment & Plan Note (Signed)
Rechecking BMP today after being off jardiance for a month. If kidney function stable, will continue. If kidney function significantly better, we will stop.

## 2021-03-24 LAB — TSH: TSH: 0.51 u[IU]/mL (ref 0.450–4.500)

## 2021-03-24 LAB — BASIC METABOLIC PANEL
BUN/Creatinine Ratio: 19 (ref 12–28)
BUN: 29 mg/dL — ABNORMAL HIGH (ref 8–27)
CO2: 21 mmol/L (ref 20–29)
Calcium: 9.5 mg/dL (ref 8.7–10.3)
Chloride: 102 mmol/L (ref 96–106)
Creatinine, Ser: 1.55 mg/dL — ABNORMAL HIGH (ref 0.57–1.00)
Glucose: 191 mg/dL — ABNORMAL HIGH (ref 70–99)
Potassium: 5.1 mmol/L (ref 3.5–5.2)
Sodium: 140 mmol/L (ref 134–144)
eGFR: 34 mL/min/{1.73_m2} — ABNORMAL LOW (ref >59)

## 2021-03-29 ENCOUNTER — Ambulatory Visit: Payer: Medicare Other | Admitting: Family Medicine

## 2021-03-29 DIAGNOSIS — Z20822 Contact with and (suspected) exposure to covid-19: Secondary | ICD-10-CM | POA: Diagnosis not present

## 2021-04-05 DIAGNOSIS — M545 Low back pain, unspecified: Secondary | ICD-10-CM | POA: Diagnosis not present

## 2021-04-05 DIAGNOSIS — M79604 Pain in right leg: Secondary | ICD-10-CM | POA: Diagnosis not present

## 2021-04-05 DIAGNOSIS — M5136 Other intervertebral disc degeneration, lumbar region: Secondary | ICD-10-CM | POA: Diagnosis not present

## 2021-04-05 DIAGNOSIS — M5416 Radiculopathy, lumbar region: Secondary | ICD-10-CM | POA: Diagnosis not present

## 2021-04-07 ENCOUNTER — Other Ambulatory Visit: Payer: Self-pay | Admitting: Family Medicine

## 2021-04-07 NOTE — Telephone Encounter (Signed)
Requested Prescriptions  Pending Prescriptions Disp Refills   SYNTHROID 150 MCG tablet [Pharmacy Med Name: SYNTHROID 150 MCG TAB] 30 tablet 0    Sig: TAKE ONE TABLET ON AN EMPTY STOMACH WITHA GLASS OF WATER AT LEAST 30 TO 34 MINUTES BEFORE BREAKFAST     Endocrinology:  Hypothyroid Agents Failed - 04/07/2021 10:07 AM      Failed - TSH needs to be rechecked within 3 months after an abnormal result. Refill until TSH is due.      Passed - TSH in normal range and within 360 days    TSH  Date Value Ref Range Status  03/23/2021 0.510 0.450 - 4.500 uIU/mL Final         Passed - Valid encounter within last 12 months    Recent Outpatient Visits          2 weeks ago Uncontrolled type 2 diabetes mellitus with hyperglycemia, with long-term current use of insulin ([place])   [place], Megan P, DO   3 weeks ago Upper respiratory tract infection, unspecified type   [place], Lauren A, NP   1 month ago Temporal pain   [place], Megan P, DO   1 month ago Temporal pain   [facility] [employer], Megan P, DO   1 month ago Type 2 diabetes mellitus with stage 3 chronic kidney disease, with long-term current use of insulin, unspecified whether stage 3a or 3b CKD ([place])   [place], Megan P, DO      Future Appointments            In 2 months [place], Barb Merino, DO [place], [place]   In 8 months  [facility]

## 2021-04-12 DIAGNOSIS — M79605 Pain in left leg: Secondary | ICD-10-CM | POA: Diagnosis not present

## 2021-04-12 DIAGNOSIS — M79604 Pain in right leg: Secondary | ICD-10-CM | POA: Diagnosis not present

## 2021-04-12 DIAGNOSIS — M5459 Other low back pain: Secondary | ICD-10-CM | POA: Diagnosis not present

## 2021-04-13 ENCOUNTER — Other Ambulatory Visit: Payer: Self-pay | Admitting: Family Medicine

## 2021-04-13 DIAGNOSIS — Z20822 Contact with and (suspected) exposure to covid-19: Secondary | ICD-10-CM | POA: Diagnosis not present

## 2021-04-14 NOTE — Telephone Encounter (Signed)
Requested medication (s) are due for refill today: yes  Requested medication (s) are on the active medication list: yes  Last refill:  09/03/20 #90 for 1 RF  Future visit scheduled: 06/21/21  Notes to clinic:  No protocol to follow for this med, please assess.   Requested Prescriptions  Pending Prescriptions Disp Refills   trimethoprim (TRIMPEX) 100 MG tablet [Pharmacy Med Name: TRIMETHOPRIM 100 MG TAB] 90 tablet 1    Sig: TAKE ONE TABLET BY MOUTH EVERY DAY     Off-Protocol Failed - 04/13/2021  5:16 PM      Failed - Medication not assigned to a protocol, review manually.      Passed - Valid encounter within last 12 months    Recent Outpatient Visits           3 weeks ago Uncontrolled type 2 diabetes mellitus with hyperglycemia, with long-term current use of insulin ([place])   [place], Megan P, DO   1 month ago Upper respiratory tract infection, unspecified type   [facility] [place], Lauren A, NP   1 month ago Temporal pain   [place], Megan P, DO   1 month ago Temporal pain   [facility] [name], Megan P, DO   1 month ago Type 2 diabetes mellitus with stage 3 chronic kidney disease, with long-term current use of insulin, unspecified whether stage 3a or 3b CKD ([place])   [place], Megan P, DO       Future Appointments             In 2 months [place], Barb Merino, DO [place], [place]   In 8 months  [facility]

## 2021-04-15 ENCOUNTER — Telehealth: Payer: Self-pay

## 2021-04-15 NOTE — Chronic Care Management (AMB) (Signed)
Chronic Care Management Pharmacy Assistant   Name: Tammy [name]  MRN: [id number] DOB: 1945/07/18  Reason for Encounter: Disease State General    Recent office visits:  03/23/21-Tammy Annia Friendly, DO (PCP) Seen for a diabetic exam. Labs ordered. Follow up in 3 months. 03/11/21-Tammy Avelina Laine, NP (Video Visit) Seen for nasal congestion. Start on Z-pack 250 mg. Return if symptoms worsen or fail to improve. 03/01/21-Tammy Annia Friendly, DO (PCP) Seen for temporal pain. Will hold her jardiance for 1 month and recheck her kidney function. Follow up in 4 weeks. 02/18/21-Tammy Annia Friendly, DO (PCP, Video visit) Seen for temporal pain. Ambulatory referral to Neurology. Return if symptoms worsen or fail to improve. 02/15/21-Tammy Annia Friendly, DO (PCP) Seen for general follow up visit. Labs ordered. Ambulatory referral to Sleep Studies.  Start prednisone. Ambulatory referral to General Surgery. Follow up in 2 weeks.  Recent consult visits:  04/05/24-Tammy [name] (Physical medicine and rehabilitation) Notes not available.  03/21/21-Tammy Leeanne Deed, MD (Neurology) Seen for tinnitus. Follow up in 1 year. 03/10/21-Tammy Sarita Haver, MD (General surgery)  01/17/21-Tammy Ann Lions, MD (Oncology) Seen for breast cancer. Follow up in mid April.   Hospital visits:  None in previous 6 months  Medications: Outpatient Encounter Medications as of 04/15/2021  Medication Sig   albuterol (VENTOLIN HFA) 108 (90 Base) MCG/ACT inhaler Inhale 2 puffs into the lungs every 6 (six) hours as needed for wheezing or shortness of breath.   anastrozole (ARIMIDEX) 1 MG tablet Take 1 tablet (1 mg total) by mouth daily.   apixaban (ELIQUIS) 2.5 MG TABS tablet Take 1 tablet (2.5 mg total) by mouth 2 (two) times daily.   blood glucose meter kit and supplies KIT Dispense based on patient and insurance preference. Use one time daily as directed, E11.9   Calcium Carb-Cholecalciferol (CALCIUM 600 + D) 600-200  MG-UNIT TABS Take 1 tablet by mouth daily.    cephALEXin (KEFLEX) 250 MG capsule Take 250 mg by mouth daily.   cetirizine (ZYRTEC) 10 MG tablet TAKE 1 TABLET BY MOUTH DAILY   cholecalciferol (VITAMIN D3) 25 MCG (1000 UT) tablet Take 1,000 Units by mouth daily.   CRANBERRY PO Take 2 capsules by mouth daily.   DULoxetine (CYMBALTA) 60 MG capsule Take 2 capsules (120 mg total) by mouth daily.   empagliflozin (JARDIANCE) 25 MG TABS tablet Take 1 tablet (25 mg total) by mouth daily. TAKE ONE TABLET 25MG EVERY DAY BEFORE BREAKFAST (Patient not taking: Reported on 03/23/2021)   gabapentin (NEURONTIN) 300 MG capsule Take 1 capsule (300 mg total) by mouth 3 (three) times daily.   glipiZIDE (GLUCOTROL) 5 MG tablet TAKE ONE TABLET BY MOUTH EVERY DAY BEFORE BREAKFAST   insulin degludec (TRESIBA FLEXTOUCH) 100 UNIT/ML FlexTouch Pen Inject 28 Units into the skin at bedtime.   Insulin Pen Needle (PEN NEEDLES 3/16") 31G X 5 MM MISC 1 each by Does not apply route daily.   isosorbide mononitrate (IMDUR) 60 MG 24 hr tablet TAKE ONE TABLET BY MOUTH AT BEDTIME   Lactobacillus (PROBIOTIC ACIDOPHILUS PO) Take 1 capsule by mouth daily at 12 noon.   metoprolol succinate (TOPROL-XL) 50 MG 24 hr tablet TAKE 1 TABLET BY MOUTH DAILY WITH OR IMMEDIATLEY FOLLOWING A MEAL   montelukast (SINGULAIR) 10 MG tablet TAKE 1 TABLET BY MOUTH AT BEDTIME   MYRBETRIQ 50 MG TB24 tablet TAKE 1 TABLET BY MOUTH DAILY   nitroGLYCERIN (NITROSTAT) 0.4 MG SL tablet Place 1 tablet (0.4 mg total) under the tongue  every 5 (five) minutes as needed for chest pain. Total of 3 doses   pantoprazole (PROTONIX) 40 MG tablet Take 1 tablet (40 mg total) by mouth daily.   Pumpkin Seed-Soy Germ (AZO BLADDER CONTROL/GO-LESS PO) Take 1 tablet by mouth in the morning and at bedtime.   rosuvastatin (CRESTOR) 40 MG tablet Take 1 tablet (40 mg total) by mouth daily.   senna (SENOKOT) 8.6 MG TABS tablet Take 8.6 mg by mouth daily as needed for mild constipation.    SYNTHROID 150 MCG tablet TAKE ONE TABLET ON AN EMPTY STOMACH WITHA GLASS OF WATER AT LEAST 30 TO 60 MINUTES BEFORE BREAKFAST   TRELEGY ELLIPTA 100-62.5-25 MCG/ACT AEPB Inhale 1 puff into the lungs daily.   trimethoprim (TRIMPEX) 100 MG tablet TAKE ONE TABLET BY MOUTH EVERY DAY   UNABLE TO FIND Med Name: Energy, memory and mood enhancer   ZINC CITRATE PO Take by mouth.   No facility-administered encounter medications on file as of 04/15/2021.   Have you had any problems recently with your health? Patient states she has been doing good with her health no issues or concerns at the moment.   Have you had any problems with your pharmacy? Patient states she has not had any problems with her pharmacy.   What issues or side effects are you having with your medications? Patient states she has no issues or side effects to any of her medications.   What would you like me to pass along to [facility] P[facility] for them to help you with?  Patient states there is nothing at this time.   What can we do to take care of you better? Patient states there is nothing at this time.   Care Gaps: TETANUS/TDAP:Never done Zoster Vaccines- Shingrix:Never done COVID-19 Vaccine:Last completed: May 29, 2019 COLONOSCOPY:Last completed: Jan 28, 2019  Star Rating Drugs: Jardiance 25 mg Last filled: Rosuvastatin 40 mg Last filled:  Tammy [name], [place]

## 2021-04-18 ENCOUNTER — Encounter: Payer: Self-pay | Admitting: Surgery

## 2021-04-18 ENCOUNTER — Ambulatory Visit (INDEPENDENT_AMBULATORY_CARE_PROVIDER_SITE_OTHER): Payer: Medicare Other | Admitting: Surgery

## 2021-04-18 ENCOUNTER — Other Ambulatory Visit: Payer: Self-pay

## 2021-04-18 VITALS — BP 117/77 | HR 88 | Temp 97.9°F | Ht 66.0 in | Wt 217.0 lb

## 2021-04-18 DIAGNOSIS — G44009 Cluster headache syndrome, unspecified, not intractable: Secondary | ICD-10-CM | POA: Diagnosis not present

## 2021-04-18 NOTE — Patient Instructions (Signed)
Please call our office with any questions or concerns. 

## 2021-04-20 ENCOUNTER — Ambulatory Visit: Payer: Self-pay

## 2021-04-20 NOTE — Progress Notes (Signed)
Outpatient Surgical Follow Up  04/20/2021  Tammy [name] is an 76 y.o. female.   Chief Complaint  Patient presents with   Routine Post Op    Temporal artery biopsy    HPI: He needs to endorse intermittent headaches.  Right temporal artery no evidence of giant cell arteritis.  Discussed with patient detail.  She is doing well and has no complaints related to her recent procedure  Past Medical History:  Diagnosis Date   Asthma    Benign neoplasm of colon    Breast cancer ([place]) 08/2018   left breast   Cervical disc disease    "bulging" - no limitations per pt   Chronic diastolic CHF (congestive heart failure) ([place])    a. 07/2012 Echo: Nl EF. mild inferoseptal HK, Gr 2 DD, mild conc LVH, mild PR/TR, mild PAH; b. 08/2019 Echo: EF 55-60%, no rwma, Gr1 DD. Nl RV size/fxn.   CKD (chronic kidney disease), stage III (HCC)    COPD (chronic obstructive pulmonary disease) (HCC)    Coronary artery disease    a. 11/2011 Cath: LAD 36m/d, LCX min irregs, RCA 70p, 164m with L->R collats, EF 60%-->Med Rx; b. 03/2019 Cath: LM nl, LAD 40-58m. LCX large, mild diff dzs. OM1/2 nl. RCA known to be 135m. L-->R collats from LAD & LCX/OM.   Diabetes mellitus without complication (HCC)    Fusion of lumbar spine 12/22/2015   GERD (gastroesophageal reflux disease)    History of DVT (deep vein thrombosis)    History of tobacco abuse    a. Quit 2011.   Hyperlipidemia    Hypertension    Hypothyroidism    Lung cancer ([place])    Obesity    Palpitations    Personal history of radiation therapy    PONV (postoperative nausea and vomiting)    Pulmonary embolus ([place]) 12/25/2013   RLL. Presented with SOB and elevated d-dimer.    PVD (peripheral vascular disease) ([place])    a. PTA of right leg and left femoral artery with stenosis; b. 09/2020 ABI: R 0.93, L 0.88 - unchanged from prior study.   Stroke [facility]) 2015   TIAs - no deficits    Past Surgical History:  Procedure Laterality Date   ANGIOPLASTY / STENTING FEMORAL      PVD; angioplasty right leg and left femoral artery with stenosis.    APPENDECTOMY  12/2014   BACK SURGERY  03/2012   nerve stimulator inserted   BACK SURGERY     screws, rods replaced with new hardware.   BACK SURGERY  11/2016   BREAST LUMPECTOMY Left 08/26/2018   BREAST SURGERY     CARDIAC CATHETERIZATION  12/07/2011   Mid LAD 50%, distal LAD 50%, mid RCA 70%, distal RCA 100%    CARDIAC CATHETERIZATION  01/04/2011   100% occluded mid RCA with good collaterals from distal LAD, normal LVEF.   CHOLECYSTECTOMY     COLONOSCOPY  2013   COLONOSCOPY WITH PROPOFOL N/A 05/24/2015   Procedure: COLONOSCOPY WITH PROPOFOL;  Surgeon: [name], MD;  Location: [facility];  Service: Endoscopy;  Laterality: N/A;  Diabetic - oral meds   COLONOSCOPY WITH PROPOFOL N/A 01/28/2019   Procedure: COLONOSCOPY WITH PROPOFOL;  Surgeon: [name], MD;  Location: [facility];  Service: Endoscopy;  Laterality: N/A;   DIAGNOSTIC LAPAROSCOPY     ESOPHAGOGASTRODUODENOSCOPY (EGD) WITH PROPOFOL N/A 01/28/2019   Procedure: ESOPHAGOGASTRODUODENOSCOPY (EGD) WITH PROPOFOL;  Surgeon: [name], MD;  Location: [facility];  Service: Endoscopy;  Laterality: N/A;   LAPAROSCOPIC APPENDECTOMY N/A 01/06/2015   Procedure: APPENDECTOMY LAPAROSCOPIC;  Surgeon: [name], MD;  Location: [facility];  Service: General;  Laterality: N/A;   LEFT HEART CATH AND CORONARY ANGIOGRAPHY Left 04/04/2019   Procedure: LEFT HEART CATH AND CORONARY ANGIOGRAPHY;  Surgeon: [name], MD;  Location: [facility];  Service: Cardiovascular;  Laterality: Left;   MASTECTOMY W/ SENTINEL NODE BIOPSY Left 08/26/2018   Procedure: PARTIAL MASTECTOMY WIDE EXCISION WITH SENTINEL LYMPH NODE BIOPSY LEFT;  Surgeon: [name], MD;  Location: [facility];  Service: General;  Laterality: Left;   POLYPECTOMY  05/24/2015   Procedure: POLYPECTOMY;  Surgeon: [name], MD;  Location: [facility];  Service: Endoscopy;;   REPLACEMENT TOTAL KNEE Right    THYROIDECTOMY     TOTAL KNEE ARTHROPLASTY Right 11/13/2019   TRIGGER FINGER RELEASE      Family History  Problem Relation Age of Onset   Cancer Mother        colon   Hypertension Father    Heart disease Father    Heart attack Father    Diabetes Father    Cancer Sister        lung   COPD Sister    COPD Sister    Heart attack Brother    Diabetes Brother    Hypertension Brother    Heart disease Brother    Heart attack Brother    Diabetes Brother    Hypertension Brother    Heart disease Brother    Heart attack Brother    Diabetes Brother    Hypertension Brother    Heart disease Brother    Colon cancer Brother 48   Breast cancer Neg Hx     Social History:  reports that she quit smoking about 14 years ago. Her smoking use included cigarettes. She has a 7.50 pack-year smoking history. She has never used smokeless tobacco. She reports that she does not drink alcohol and does not use drugs.  Allergies:  Allergies  Allergen Reactions   Hydrocodone Other (See Comments)    Unresponsive   Aleve [Naproxen Sodium]     Hives & itching    Contrast Media [Iodinated Contrast Media]     Pt avoids due to kidney issues   Ioxaglate Other (See Comments)    (iodine) Pt avoids due to kidney issues    Oxycodone Other (See Comments)    hallucinations   Ranexa [Ranolazine]     Sick on stomach   Sulfa Antibiotics Other (See Comments)    Pt avoids due to kidney issues   Tramadol Other (See Comments)    nightmares    Medications reviewed.    ROS Full ROS performed and is otherwise negative other than what is stated in HPI   BP 117/77    Pulse 88    Temp 97.9 F (36.6 C) (Oral)    Ht 5\' 6"  (1.676 m)    Wt 217 lb (98.4 kg)    SpO2 98%    BMI 35.02 kg/m   Physical Exam Vitals and nursing note reviewed. Exam conducted with a chaperone present.  Constitutional:      General: She is not in acute distress.     Appearance: Normal appearance. She is not toxic-appearing.  Musculoskeletal:        General: No swelling. Normal range of motion.     Cervical back: Normal range of motion and neck supple. No rigidity or tenderness.  Lymphadenopathy:  Cervical: No cervical adenopathy.  Skin:    General: Skin is warm and dry.     Capillary Refill: Capillary refill takes less than 2 seconds.     Coloration: Skin is not jaundiced.     Comments: Right temporal incision c/d/I, no infection  Neurological:     General: No focal deficit present.     Mental Status: She is alert and oriented to person, place, and time.  Psychiatric:        Mood and Affect: Mood normal.        Behavior: Behavior normal.        Thought Content: Thought content normal.        Judgment: Judgment normal.    Assessment/Plan: Doing well from temporal artery biopsy.  No complications.  RTC as needed.  Please note I spent more than 20 minutes in this encounter including personally reviewing records, counseling the patient and performing appropriate documentation  Caroleen Hamman, MD [place] Surgeon

## 2021-04-20 NOTE — Telephone Encounter (Signed)
°  Chief Complaint: uti Symptoms: lower abd pain and flank pain, increased urination urgency and incontinence Frequency: 2-3 days Pertinent Negatives: Patient denies fever Disposition: [] ED /[] Urgent Care (no appt availability in office) / [] Appointment(In office/virtual)/ []  [place] [facility] [facility]/ [] Home Care/ [] Refused Recommended Disposition /[] [place] [place] [facility]/ []  Follow-up with PCP Additional Notes: Pt states she has frequent UTIs and is on low dose abx to prevent infection but normally Cipro is called in for her. Pt states she is leaving her in the morning to go out of town so unable to do appt, wanting to see if can be sent to pharmacy this evening.   Summary: frequent uring with moderate pan   Patient called in asking if med can be sent in because she has to go out of town tomorrow, she has frequent urination with moderate pain. Pharmacy [place], [place] - [place]  Phone: [phone]  Fax: [phone]        Reason for Disposition  > 2 UTI's in last year  Answer Assessment - Initial Assessment Questions 1. SEVERITY: "How bad is the pain?"  (e.g., Scale 1-10; mild, moderate, or severe)   - MILD (1-3): complains slightly about urination hurting   - MODERATE (4-7): interferes with normal activities     - SEVERE (8-10): excruciating, unwilling or unable to urinate because of the pain      4 2. FREQUENCY: "How many times have you had painful urination today?"      yes 4. ONSET: "When did the painful urination start?"      2-3 days 5. FEVER: "Do you have a fever?" If Yes, ask: "What is your temperature, how was it measured, and when did it start?"     No 6. PAST UTI: "Have you had a urine infection before?" If Yes, ask: "When was the last time?" and "What happened that time?"      yes 7. CAUSE: "What do you think is causing the painful urination?"  (e.g., UTI, scratch, Herpes sore)     UTI 8. OTHER SYMPTOMS: "Do you have any other symptoms?"  (e.g., flank pain, vaginal discharge, genital sores, urgency, blood in urine)     Lower abdominal pain and flank pain  Protocols used: Urination Pain - Female-A-AH

## 2021-04-21 MED ORDER — CIPROFLOXACIN HCL 500 MG PO TABS: 500.0000 mg | ORAL_TABLET | Freq: Two times a day (BID) | ORAL | 0 refills | Status: AC

## 2021-04-21 NOTE — Telephone Encounter (Signed)
This was sent when I was not in the office and we usually have her leave a urine. Can we see where she needs her medicine sent as she is out of town?

## 2021-04-21 NOTE — Telephone Encounter (Signed)
Called and spoke with patient. She states she is in [place]. Pharmacy added in chart.

## 2021-04-21 NOTE — Addendum Note (Signed)
Addended by: [name] on: 04/21/2021 04:37 PM   Modules accepted: Orders

## 2021-04-26 ENCOUNTER — Ambulatory Visit (INDEPENDENT_AMBULATORY_CARE_PROVIDER_SITE_OTHER): Payer: Medicare Other

## 2021-04-26 ENCOUNTER — Telehealth: Payer: Medicare Other

## 2021-04-26 DIAGNOSIS — M546 Pain in thoracic spine: Secondary | ICD-10-CM

## 2021-04-26 DIAGNOSIS — I10 Essential (primary) hypertension: Secondary | ICD-10-CM

## 2021-04-26 DIAGNOSIS — R1031 Right lower quadrant pain: Secondary | ICD-10-CM

## 2021-04-26 DIAGNOSIS — I25118 Atherosclerotic heart disease of native coronary artery with other forms of angina pectoris: Secondary | ICD-10-CM

## 2021-04-26 DIAGNOSIS — Z794 Long term (current) use of insulin: Secondary | ICD-10-CM

## 2021-04-26 DIAGNOSIS — J449 Chronic obstructive pulmonary disease, unspecified: Secondary | ICD-10-CM

## 2021-04-26 DIAGNOSIS — E782 Mixed hyperlipidemia: Secondary | ICD-10-CM

## 2021-04-26 DIAGNOSIS — E1165 Type 2 diabetes mellitus with hyperglycemia: Secondary | ICD-10-CM

## 2021-04-26 NOTE — Patient Instructions (Signed)
Visit Information  Thank you for taking time to visit with me today. Please don't hesitate to contact me if I can be of assistance to you before our next scheduled telephone appointment.  Following are the goals we discussed today:  RNCM Clinical Goal(s):  Patient will verbalize basic understanding of CAD, HTN, HLD, COPD, DMII, and Chronic pain  disease process and self health management plan as evidenced by keeping appointments, taking medications, following dietary restrictions, calling the office for changes in conditions, questions or concerns take all medications exactly as prescribed and will call provider for medication related questions as evidenced by compliance with medications, and calling before needed refills     attend all scheduled medical appointments: 06-21-2021 at 1:40 pm as evidenced by keeping appointments and calling for schedule change needs         demonstrate improved and ongoing adherence to prescribed treatment plan for CAD, HTN, HLD, COPD, DMII, and Chronic pain  as evidenced by stable conditions and working with the CCM team to effectively manage chronic conditions  demonstrate a decrease in CAD, HTN, HLD, COPD, DMII, and Chronic pain  exacerbations  as evidenced by following the plan of care and keeping the pcp and CCM team updated to changes in condition demonstrate ongoing self health care management ability to effectively manage chronic conditions  as evidenced by working with the CCM team  through collaboration with Consulting civil engineer, provider, and care team.    Interventions: 1:1 collaboration with primary care provider regarding development and update of comprehensive plan of care as evidenced by provider attestation and co-signature Inter-disciplinary care team collaboration (see longitudinal plan of care) Evaluation of current treatment plan related to  self management and patient's adherence to plan as established by provider     CAD  (Status: Goal on Track  (progressing): YES.) Long Term Goal     BP Readings from Last 3 Encounters:  04/18/21 117/77  03/23/21 103/70  03/11/21 (!) 148/83         Lab Results  Component Value Date    CHOL 161 02/15/2021    HDL 49 02/15/2021    [place] 78 02/15/2021    TRIG 204 (H) 02/15/2021    CHOLHDL 3.2 09/07/2015    Assessed understanding of CAD diagnosis Medications reviewed including medications utilized in CAD treatment plan. 04-26-2021: The patient is compliant with medications Provided education on importance of blood pressure control in management of CAD. 04-26-2021: The patient has good control of her blood pressures; Provided education on Importance of limiting foods high in cholesterol. 04-26-2021: The patient states that she is compliant with heart healthy/ADA diet ; Counseled on importance of regular laboratory monitoring as prescribed; Counseled on the importance of exercise goals with target of 150 minutes per week Reviewed Importance of taking all medications as prescribed Reviewed Importance of attending all scheduled provider appointments Advised to report any changes in symptoms or exercise tolerance   COPD: (Status: Goal on Track (progressing): YES.) Long Term Goal  Reviewed medications with patient, including use of prescribed maintenance and rescue inhalers, and provided instruction on medication management and the importance of adherence. 04-26-2021: The patient is compliant with her medications  Provided patient with basic written and verbal COPD education on self care/management/and exacerbation prevention Advised patient to track and manage COPD triggers. 04-26-2021: The patient aware of things that trigger her COPD.  Provided written and verbal instructions on pursed lip breathing and utilized returned demonstration as teach back Provided instruction about proper use  of medications used for management of COPD including inhalers Advised patient to self assesses COPD action plan zone  and make appointment with provider if in the yellow zone for 48 hours without improvement. 04-26-2021: The patient states she needs to get a follow up appointment with the pulmonary provider. She should have already gotten one but she hasn't. She has had several appointments. She states her breathing is not all that good but it is stable. Denies any acute findings related to pulmonary conditions.  Advised patient to engage in light exercise as tolerated 3-5 days a week to aid in the the management of COPD Provided education about and advised patient to utilize infection prevention strategies to reduce risk of respiratory infection. 04-26-2021: The patient denies any issues with infection control. The patient is mindful of others who are sick and monitoring weather changes.  Discussed the importance of adequate rest and management of fatigue with COPD Screening for signs and symptoms of depression related to chronic disease state  Assessed social determinant of health barriers   Diabetes:  (Status: Goal on Track (progressing): YES.) Long Term Goal         Lab Results  Component Value Date    HGBA1C 8.0 (H) 02/15/2021  Assessed patient's understanding of A1c goal: <7% Provided education to patient about basic DM disease process; Reviewed medications with patient and discussed importance of medication adherence;        Reviewed prescribed diet with patient heart healthy/ADA diet. 04-26-2021: The patient often skips meals and does not eat lunch. The patient states that she doesn't eat a lot. Discussed eating a light snack when she doesn't want to eat meals.  Counseled on importance of regular laboratory monitoring as prescribed;        Discussed plans with patient for ongoing care management follow up and provided patient with direct contact information for care management team;      Provided patient with written educational materials related to hypo and hyperglycemia and importance of correct treatment.  02-22-2021: The patient had blood sugar up to 506 after taking prednisone for headache pain. The patient has completed prednisone and is having better blood sugars. This am her blood sugar was 161, and yesterday it was 114. 04-26-2021: States that her blood sugars are "all over the place" and most of the time under 200 but she would like to see them lower. Denies any real high elevations of blood sugars.    Reviewed scheduled/upcoming provider appointments including: 06-21-2021 at 140 pm ;         Advised patient, providing education and rationale, to check cbg before meals and at bedtime, when you have symptoms of low or high blood sugar, and before and after exercise and record. 02-22-2021: The patient has been checking blood sugars regularly. Has been having ups and downs due to prednisone for temporal headache pain. The patient states that she is better today and sees the specialist tomorrow. The patient denies any acute findings with DM at this time. 04-26-2021: The patient is having up and down readings. States that her blood sugars are staying below <200. Reviewed with the patient the goal of fasting blood sugars of <130 and post prandial of <180. Education and support given. Will continue to monitor.        call provider for findings outside established parameters;       Review of patient status, including review of consultants reports, relevant laboratory and other test results, and medications completed;  Advised patient to discuss Jardiance concern  with provider.  She was told by her nephrologist that [name] was not good for her kidneys. Advised the patient to discuss with pcp next week at her appointment.       Eye exam up to date. States she needs to have cataract surgery but has had issues with the eye doctor so she is planning on going to see a new one. (04-26-2021)   Hyperlipidemia:  (Status: Goal on Track (progressing): YES.) Long Term Goal       Lab Results  Component Value Date     CHOL 161 02/15/2021    HDL 49 02/15/2021    LDLCALC 78 02/15/2021    TRIG 204 (H) 02/15/2021    CHOLHDL 3.2 09/07/2015      Medication review performed; medication list updated in electronic medical record.  Provider established cholesterol goals reviewed. 04-26-2021: The patient has good control of her cholesterol levels.  Counseled on importance of regular laboratory monitoring as prescribed. 04-26-2021: Has regular testing and is compliant with lab work; Provided HLD Scientist, clinical (histocompatibility and immunogenetics); Reviewed role and benefits of statin for ASCVD risk reduction; Discussed strategies to manage statin-induced myalgias; Reviewed importance of limiting foods high in cholesterol; Reviewed exercise goals and target of 150 minutes per week;   Hypertension: (Status: Goal on Track (progressing): YES.) Last practice recorded BP readings:     BP Readings from Last 3 Encounters:  04/18/21 117/77  03/23/21 103/70  03/11/21 (!) 148/83  Most recent eGFR/CrCl:       Lab Results  Component Value Date    EGFR 34 (L) 03/23/2021    No components found for: CRCL   Evaluation of current treatment plan related to hypertension self management and patient's adherence to plan as established by provider. 04-26-2021: The patient has good control of her blood pressures at this time. Denies any issues with HTN or heart health;   Provided education to patient re: stroke prevention, s/s of heart attack and stroke; Reviewed prescribed diet heart healthy/ADA diet. 04-26-2021: Review and education provided  Reviewed medications with patient and discussed importance of compliance;  Counseled on adverse effects of illicit drug and excessive alcohol use in patients with high blood pressure;  Counseled on the importance of exercise goals with target of 150 minutes per week Discussed plans with patient for ongoing care management follow up and provided patient with direct contact information for care management team; Advised  patient, providing education and rationale, to monitor blood pressure daily and record, calling PCP for findings outside established parameters;  Provided education on prescribed diet heart healthy/ADA;  Discussed complications of poorly controlled blood pressure such as heart disease, stroke, circulatory complications, vision complications, kidney impairment, sexual dysfunction;    Pain:  (Status: Goal on Track (progressing): YES.) Long Term Goal  Pain assessment performed. 02-22-2021: The patient rates her head/temporal pain at 3 to 4 today. Has chronic back and knee pain also. The patient denies any acute findings. Is going to specialist tomorrow to have further evaluation of temporal pain that has been going on for about 2 weeks in addition to other chronic pain areas. 04-26-2021: The patient is having back pain when she walks at a 10. Today she is even having it when she sits down. The patient states that she is also having stomach pain and rates it at a 7 today on a scale of 0-10. She states she was doing PT to help with ambulation and back pain but it was only  making it worse. She does not think she is going back. Education and support given.  Medications reviewed Reviewed provider established plan for pain management; Discussed importance of adherence to all scheduled medical appointments; Counseled on the importance of reporting any/all new or changed pain symptoms or management strategies to pain management provider; Advised patient to report to care team affect of pain on daily activities; Discussed use of relaxation techniques and/or diversional activities to assist with pain reduction (distraction, imagery, relaxation, massage, acupressure, TENS, heat, and cold application; Reviewed with patient prescribed pharmacological and nonpharmacological pain relief strategies; Advised patient to discuss unresolved pain, changes in level or intensity of pain  with provider;   Patient Goals/Self-Care  Activities: Take medications as prescribed   Attend all scheduled provider appointments Call pharmacy for medication refills 3-7 days in advance of running out of medications schedule appointment with eye doctor check blood sugar at prescribed times: before meals and at bedtime, when you have symptoms of low or high blood sugar, and before and after exercise check feet daily for cuts, sores or redness enter blood sugar readings and medication or insulin into daily log take the blood sugar log to all doctor visits take the blood sugar meter to all doctor visits trim toenails straight across drink 6 to 8 glasses of water each day eat fish at least once per week fill half of plate with vegetables manage portion size keep feet up while sitting wash and dry feet carefully every day wear comfortable, cotton socks wear comfortable, well-fitting shoes - avoid second hand smoke - eliminate smoking in my home - identify and avoid work-related triggers - identify and remove indoor air pollutants - limit outdoor activity during cold weather - listen for public air quality announcements every day - do breathing exercises every day - eliminate symptom triggers at home - follow rescue plan if symptoms flare-up - use an extra pillow to sleep - develop a new routine to improve sleep - don't eat or exercise right before bedtime - eat healthy/prescribed diet: heart healthy/ADA - get at least 7 to 8 hours of sleep at night - use devices that will help like a cane, sock-puller or reacher - practice relaxation or meditation daily - do exercises in a comfortable position that makes breathing as easy as possible check blood pressure daily choose a place to take my blood pressure (home, clinic or office, retail store) write blood pressure results in a log or diary learn about high blood pressure keep a blood pressure log take blood pressure log to all doctor appointments call doctor for signs and  symptoms of high blood pressure develop an action plan for high blood pressure keep all doctor appointments take medications for blood pressure exactly as prescribed begin an exercise program report new symptoms to your doctor eat more whole grains, fruits and vegetables, lean meats and healthy fats - call for medicine refill 2 or 3 days before it runs out - take all medications exactly as prescribed - call doctor with any symptoms you believe are related to your medicine - call doctor when you experience any new symptoms - go to all doctor appointments as scheduled    Our next appointment is by telephone on 06-28-2021 at 1030 am  Please call the care guide team at [phone] if you need to cancel or reschedule your appointment.   If you are experiencing a Mental Health or [place] or need someone to talk to, please call the Suicide and Crisis  Lifeline: 988 call the [place] National Suicide Prevention Lifeline: 719-098-8170 or TTY: (669) 113-6189 TTY 2695458764) to talk to a trained counselor call 1-800-273-TALK (toll free, 24 hour hotline)   Patient verbalizes understanding of instructions and care plan provided today and agrees to view in [place]. Active MyChart status confirmed with patient.   Noreene Larsson RN, MSN, [facility] Mobile: [phone]

## 2021-04-26 NOTE — Chronic Care Management (AMB) (Signed)
Chronic Care Management   CCM RN Visit Note  04/26/2021 Name: Tammy [name] MRN: [id number] DOB: [birth year]  Subjective: Tammy [name] is a 76 y.o. year old female who is a primary care patient of [name], DO. The care management team was consulted for assistance with disease management and care coordination needs.    Engaged with patient by telephone for follow up visit in response to provider referral for case management and/or care coordination services.   Consent to Services:  The patient was given information about Chronic Care Management services, agreed to services, and gave verbal consent prior to initiation of services.  Please see initial visit note for detailed documentation.   Patient agreed to services and verbal consent obtained.   Assessment: Review of patient past medical history, allergies, medications, health status, including review of consultants reports, laboratory and other test data, was performed as part of comprehensive evaluation and provision of chronic care management services.   SDOH (Social Determinants of Health) assessments and interventions performed:    CCM Care Plan  Allergies  Allergen Reactions   Hydrocodone Other (See Comments)    Unresponsive   Aleve [Naproxen Sodium]     Hives & itching    Contrast Media [Iodinated Contrast Media]     Pt avoids due to kidney issues   Ioxaglate Other (See Comments)    (iodine) Pt avoids due to kidney issues    Oxycodone Other (See Comments)    hallucinations   Ranexa [Ranolazine]     Sick on stomach   Sulfa Antibiotics Other (See Comments)    Pt avoids due to kidney issues   Tramadol Other (See Comments)    nightmares    Outpatient Encounter Medications as of 04/26/2021  Medication Sig   albuterol (VENTOLIN HFA) 108 (90 Base) MCG/ACT inhaler Inhale 2 puffs into the lungs every 6 (six) hours as needed for wheezing or shortness of breath.   anastrozole (ARIMIDEX) 1 MG tablet Take 1 tablet (1 mg  total) by mouth daily.   apixaban (ELIQUIS) 2.5 MG TABS tablet Take 1 tablet (2.5 mg total) by mouth 2 (two) times daily.   blood glucose meter kit and supplies KIT Dispense based on patient and insurance preference. Use one time daily as directed, E11.9   Calcium Carb-Cholecalciferol (CALCIUM 600 + D) 600-200 MG-UNIT TABS Take 1 tablet by mouth daily.    cephALEXin (KEFLEX) 250 MG capsule Take 250 mg by mouth daily.   cetirizine (ZYRTEC) 10 MG tablet TAKE 1 TABLET BY MOUTH DAILY   cholecalciferol (VITAMIN D3) 25 MCG (1000 UT) tablet Take 1,000 Units by mouth daily.   CRANBERRY PO Take 2 capsules by mouth daily.   DULoxetine (CYMBALTA) 60 MG capsule Take 2 capsules (120 mg total) by mouth daily.   empagliflozin (JARDIANCE) 25 MG TABS tablet Take 1 tablet (25 mg total) by mouth daily. TAKE ONE TABLET 25MG EVERY DAY BEFORE BREAKFAST   gabapentin (NEURONTIN) 300 MG capsule Take 1 capsule (300 mg total) by mouth 3 (three) times daily.   glipiZIDE (GLUCOTROL) 5 MG tablet TAKE ONE TABLET BY MOUTH EVERY DAY BEFORE BREAKFAST   insulin degludec (TRESIBA FLEXTOUCH) 100 UNIT/ML FlexTouch Pen Inject 28 Units into the skin at bedtime.   Insulin Pen Needle (PEN NEEDLES 3/16") 31G X 5 MM MISC 1 each by Does not apply route daily.   isosorbide mononitrate (IMDUR) 60 MG 24 hr tablet TAKE ONE TABLET BY MOUTH AT BEDTIME   Lactobacillus (PROBIOTIC ACIDOPHILUS PO) Take  1 capsule by mouth daily at 12 noon.   metoprolol succinate (TOPROL-XL) 50 MG 24 hr tablet TAKE 1 TABLET BY MOUTH DAILY WITH OR IMMEDIATLEY FOLLOWING A MEAL   montelukast (SINGULAIR) 10 MG tablet TAKE 1 TABLET BY MOUTH AT BEDTIME   MYRBETRIQ 50 MG TB24 tablet TAKE 1 TABLET BY MOUTH DAILY   nitroGLYCERIN (NITROSTAT) 0.4 MG SL tablet Place 1 tablet (0.4 mg total) under the tongue every 5 (five) minutes as needed for chest pain. Total of 3 doses   pantoprazole (PROTONIX) 40 MG tablet Take 1 tablet (40 mg total) by mouth daily.   Pumpkin Seed-Soy Germ  (AZO BLADDER CONTROL/GO-LESS PO) Take 1 tablet by mouth in the morning and at bedtime.   rosuvastatin (CRESTOR) 40 MG tablet Take 1 tablet (40 mg total) by mouth daily.   senna (SENOKOT) 8.6 MG TABS tablet Take 8.6 mg by mouth daily as needed for mild constipation.   SYNTHROID 150 MCG tablet TAKE ONE TABLET ON AN EMPTY STOMACH WITHA GLASS OF WATER AT LEAST 30 TO 60 MINUTES BEFORE BREAKFAST   TRELEGY ELLIPTA 100-62.5-25 MCG/ACT AEPB Inhale 1 puff into the lungs daily.   trimethoprim (TRIMPEX) 100 MG tablet TAKE ONE TABLET BY MOUTH EVERY DAY   UNABLE TO FIND Med Name: Energy, memory and mood enhancer   ZINC CITRATE PO Take by mouth.   No facility-administered encounter medications on file as of 04/26/2021.    Patient Active Problem List   Diagnosis Date Noted   Intermittent claudication ([place]) 02/15/2021   Cervical radiculopathy 10/14/2020   Sacroiliac inflammation ([place]) 10/14/2020   Lung nodule 08/19/2020   Fall 04/17/2020   Anemia 02/16/2020   Asthma 02/16/2020   History of cardiac catheterization 02/16/2020   History of left mastectomy 02/16/2020   Malignant tumor of breast ([place]) 02/16/2020   History of total knee arthroplasty 11/18/2019   Unstable angina ([place]) 04/03/2019   Dysphagia    Thickening of esophagus    Stomach irritation    Gastric polyp    Positive occult stool blood test    Polyp of colon    Hyperkalemia 11/27/2018   Proteinuria 11/27/2018   Type 2 diabetes mellitus with stage 3 chronic kidney disease ([place]) 11/27/2018   Carcinoma of lower-inner quadrant of left breast in female, estrogen receptor positive ([place]) 10/02/2018   Right knee pain 08/01/2017   Chronic right-sided low back pain with right-sided sciatica 05/28/2017   Gait abnormality 05/28/2017   Hypothyroid 07/18/2016   COPD (chronic obstructive pulmonary disease) ([place]) 07/18/2016   Encephalomalacia on imaging study 12/10/2015   Calcification of both carotid arteries 12/10/2015   Benign neoplasm of  sigmoid colon    Benign neoplasm of cecum    Benign neoplasm of ascending colon    Bilateral atelectasis 04/11/2015   Fatty liver disease, nonalcoholic 57/90/3833   Hydronephrosis of right kidney 04/11/2015   Abdominal aortic ectasia ([place]) 04/11/2015   Constipation 04/07/2015   Abdominal aortic atherosclerosis ([place]) 12/18/2014   Diverticulosis of colon 12/18/2014   Rhinitis, allergic 10/20/2014   Coronary artery disease    Essential hypertension    Morbid obesity due to excess calories ([place])    Spinal stenosis of lumbar region 06/06/2014   Herniated nucleus pulposus 06/06/2014   Neuralgia neuritis, sciatic nerve 05/24/2014   Pulmonary embolism ([place]) 01/30/2014   Benign hypertensive renal disease 12/16/2013   Uncontrolled type 2 diabetes mellitus with hyperglycemia, with long-term current use of insulin ([place]) 12/16/2013   Chronic kidney disease 12/16/2013   Frequent  UTI 11/03/2013   Overactive bladder 11/03/2013   Coronary atherosclerosis of native coronary artery 06/19/2013   History of back surgery 06/19/2013   Hyperlipidemia 06/19/2013   PAD (peripheral artery disease) ([place]) 06/19/2013   Congenital obstruction of ureteropelvic junction 11/21/2011    Conditions to be addressed/monitored:CAD, HTN, HLD, COPD, DMII, and Chronic pain   Care Plan : RNCM: General Plan of Care (Adult) for Chronic Disease Management and Care Coordination Needs  Updates made by [name], RN since 04/26/2021 12:00 AM     Problem: RNCM; Development of Plan of Care for Chronic Disease Management (DM, CAD, COPD, HTN, HLD, chronic pain)   Priority: High     Long-Range Goal: RNCM: Effective Management of Plan of Care for Chronic Disease Management (DM, CAD, COPD, HTN, HLD, chronic pain)   Start Date: 02/22/2021  Expected End Date: 02/22/2022  Priority: High  Note:   Current Barriers:  Knowledge Deficits related to plan of care for management of CAD, HTN, HLD, COPD, DMII, and chronic pain    Chronic Disease Management support and education needs related to CAD, HTN, HLD, COPD, DMII, and Chronic pain   RNCM Clinical Goal(s):  Patient will verbalize basic understanding of CAD, HTN, HLD, COPD, DMII, and Chronic pain  disease process and self health management plan as evidenced by keeping appointments, taking medications, following dietary restrictions, calling the office for changes in conditions, questions or concerns take all medications exactly as prescribed and will call provider for medication related questions as evidenced by compliance with medications, and calling before needed refills     attend all scheduled medical appointments: 06-21-2021 at 1:40 pm as evidenced by keeping appointments and calling for schedule change needs         demonstrate improved and ongoing adherence to prescribed treatment plan for CAD, HTN, HLD, COPD, DMII, and Chronic pain  as evidenced by stable conditions and working with the CCM team to effectively manage chronic conditions  demonstrate a decrease in CAD, HTN, HLD, COPD, DMII, and Chronic pain  exacerbations  as evidenced by following the plan of care and keeping the pcp and CCM team updated to changes in condition demonstrate ongoing self health care management ability to effectively manage chronic conditions  as evidenced by working with the CCM team  through collaboration with Consulting civil engineer, provider, and care team.   Interventions: 1:1 collaboration with primary care provider regarding development and update of comprehensive plan of care as evidenced by provider attestation and co-signature Inter-disciplinary care team collaboration (see longitudinal plan of care) Evaluation of current treatment plan related to  self management and patient's adherence to plan as established by provider   CAD  (Status: Goal on Track (progressing): YES.) Long Term Goal  BP Readings from Last 3 Encounters:  04/18/21 117/77  03/23/21 103/70  03/11/21 (!)  148/83    Lab Results  Component Value Date   CHOL 161 02/15/2021   HDL 49 02/15/2021   Raymond 78 02/15/2021   TRIG 204 (H) 02/15/2021   CHOLHDL 3.2 09/07/2015    Assessed understanding of CAD diagnosis Medications reviewed including medications utilized in CAD treatment plan. 04-26-2021: The patient is compliant with medications Provided education on importance of blood pressure control in management of CAD. 04-26-2021: The patient has good control of her blood pressures; Provided education on Importance of limiting foods high in cholesterol. 04-26-2021: The patient states that she is compliant with heart healthy/ADA diet ; Counseled on importance of regular laboratory monitoring as  prescribed; Counseled on the importance of exercise goals with target of 150 minutes per week Reviewed Importance of taking all medications as prescribed Reviewed Importance of attending all scheduled provider appointments Advised to report any changes in symptoms or exercise tolerance  COPD: (Status: Goal on Track (progressing): YES.) Long Term Goal  Reviewed medications with patient, including use of prescribed maintenance and rescue inhalers, and provided instruction on medication management and the importance of adherence. 04-26-2021: The patient is compliant with her medications  Provided patient with basic written and verbal COPD education on self care/management/and exacerbation prevention Advised patient to track and manage COPD triggers. 04-26-2021: The patient aware of things that trigger her COPD.  Provided written and verbal instructions on pursed lip breathing and utilized returned demonstration as teach back Provided instruction about proper use of medications used for management of COPD including inhalers Advised patient to self assesses COPD action plan zone and make appointment with provider if in the yellow zone for 48 hours without improvement. 04-26-2021: The patient states she needs to get a  follow up appointment with the pulmonary provider. She should have already gotten one but she hasn't. She has had several appointments. She states her breathing is not all that good but it is stable. Denies any acute findings related to pulmonary conditions.  Advised patient to engage in light exercise as tolerated 3-5 days a week to aid in the the management of COPD Provided education about and advised patient to utilize infection prevention strategies to reduce risk of respiratory infection. 04-26-2021: The patient denies any issues with infection control. The patient is mindful of others who are sick and monitoring weather changes.  Discussed the importance of adequate rest and management of fatigue with COPD Screening for signs and symptoms of depression related to chronic disease state  Assessed social determinant of health barriers  Diabetes:  (Status: Goal on Track (progressing): YES.) Long Term Goal   Lab Results  Component Value Date   HGBA1C 8.0 (H) 02/15/2021  Assessed patient's understanding of A1c goal: <7% Provided education to patient about basic DM disease process; Reviewed medications with patient and discussed importance of medication adherence;        Reviewed prescribed diet with patient heart healthy/ADA diet. 04-26-2021: The patient often skips meals and does not eat lunch. The patient states that she doesn't eat a lot. Discussed eating a light snack when she doesn't want to eat meals.  Counseled on importance of regular laboratory monitoring as prescribed;        Discussed plans with patient for ongoing care management follow up and provided patient with direct contact information for care management team;      Provided patient with written educational materials related to hypo and hyperglycemia and importance of correct treatment. 02-22-2021: The patient had blood sugar up to 506 after taking prednisone for headache pain. The patient has completed prednisone and is having  better blood sugars. This am her blood sugar was 161, and yesterday it was 114. 04-26-2021: States that her blood sugars are "all over the place" and most of the time under 200 but she would like to see them lower. Denies any real high elevations of blood sugars.    Reviewed scheduled/upcoming provider appointments including: 06-21-2021 at 140 pm ;         Advised patient, providing education and rationale, to check cbg before meals and at bedtime, when you have symptoms of low or high blood sugar, and before and after exercise and  record. 02-22-2021: The patient has been checking blood sugars regularly. Has been having ups and downs due to prednisone for temporal headache pain. The patient states that she is better today and sees the specialist tomorrow. The patient denies any acute findings with DM at this time. 04-26-2021: The patient is having up and down readings. States that her blood sugars are staying below <200. Reviewed with the patient the goal of fasting blood sugars of <130 and post prandial of <180. Education and support given. Will continue to monitor.        call provider for findings outside established parameters;       Review of patient status, including review of consultants reports, relevant laboratory and other test results, and medications completed;       Advised patient to discuss Jardiance concern  with provider.  She was told by her nephrologist that [name] was not good for her kidneys. Advised the patient to discuss with pcp next week at her appointment.       Eye exam up to date. States she needs to have cataract surgery but has had issues with the eye doctor so she is planning on going to see a new one. (04-26-2021)  Hyperlipidemia:  (Status: Goal on Track (progressing): YES.) Long Term Goal  Lab Results  Component Value Date   CHOL 161 02/15/2021   HDL 49 02/15/2021   LDLCALC 78 02/15/2021   TRIG 204 (H) 02/15/2021   CHOLHDL 3.2 09/07/2015     Medication review  performed; medication list updated in electronic medical record.  Provider established cholesterol goals reviewed. 04-26-2021: The patient has good control of her cholesterol levels.  Counseled on importance of regular laboratory monitoring as prescribed. 04-26-2021: Has regular testing and is compliant with lab work; Provided HLD Scientist, clinical (histocompatibility and immunogenetics); Reviewed role and benefits of statin for ASCVD risk reduction; Discussed strategies to manage statin-induced myalgias; Reviewed importance of limiting foods high in cholesterol; Reviewed exercise goals and target of 150 minutes per week;  Hypertension: (Status: Goal on Track (progressing): YES.) Last practice recorded BP readings:  BP Readings from Last 3 Encounters:  04/18/21 117/77  03/23/21 103/70  03/11/21 (!) 148/83  Most recent eGFR/CrCl:  Lab Results  Component Value Date   EGFR 34 (L) 03/23/2021    No components found for: CRCL  Evaluation of current treatment plan related to hypertension self management and patient's adherence to plan as established by provider. 04-26-2021: The patient has good control of her blood pressures at this time. Denies any issues with HTN or heart health;   Provided education to patient re: stroke prevention, s/s of heart attack and stroke; Reviewed prescribed diet heart healthy/ADA diet. 04-26-2021: Review and education provided  Reviewed medications with patient and discussed importance of compliance;  Counseled on adverse effects of illicit drug and excessive alcohol use in patients with high blood pressure;  Counseled on the importance of exercise goals with target of 150 minutes per week Discussed plans with patient for ongoing care management follow up and provided patient with direct contact information for care management team; Advised patient, providing education and rationale, to monitor blood pressure daily and record, calling PCP for findings outside established parameters;  Provided education  on prescribed diet heart healthy/ADA;  Discussed complications of poorly controlled blood pressure such as heart disease, stroke, circulatory complications, vision complications, kidney impairment, sexual dysfunction;   Pain:  (Status: Goal on Track (progressing): YES.) Long Term Goal  Pain assessment performed. 02-22-2021: The patient rates her  head/temporal pain at 3 to 4 today. Has chronic back and knee pain also. The patient denies any acute findings. Is going to specialist tomorrow to have further evaluation of temporal pain that has been going on for about 2 weeks in addition to other chronic pain areas. 04-26-2021: The patient is having back pain when she walks at a 10. Today she is even having it when she sits down. The patient states that she is also having stomach pain and rates it at a 7 today on a scale of 0-10. She states she was doing PT to help with ambulation and back pain but it was only making it worse. She does not think she is going back. Education and support given.  Medications reviewed Reviewed provider established plan for pain management; Discussed importance of adherence to all scheduled medical appointments; Counseled on the importance of reporting any/all new or changed pain symptoms or management strategies to pain management provider; Advised patient to report to care team affect of pain on daily activities; Discussed use of relaxation techniques and/or diversional activities to assist with pain reduction (distraction, imagery, relaxation, massage, acupressure, TENS, heat, and cold application; Reviewed with patient prescribed pharmacological and nonpharmacological pain relief strategies; Advised patient to discuss unresolved pain, changes in level or intensity of pain  with provider;  Patient Goals/Self-Care Activities: Take medications as prescribed   Attend all scheduled provider appointments Call pharmacy for medication refills 3-7 days in advance of running out of  medications schedule appointment with eye doctor check blood sugar at prescribed times: before meals and at bedtime, when you have symptoms of low or high blood sugar, and before and after exercise check feet daily for cuts, sores or redness enter blood sugar readings and medication or insulin into daily log take the blood sugar log to all doctor visits take the blood sugar meter to all doctor visits trim toenails straight across drink 6 to 8 glasses of water each day eat fish at least once per week fill half of plate with vegetables manage portion size keep feet up while sitting wash and dry feet carefully every day wear comfortable, cotton socks wear comfortable, well-fitting shoes - avoid second hand smoke - eliminate smoking in my home - identify and avoid work-related triggers - identify and remove indoor air pollutants - limit outdoor activity during cold weather - listen for public air quality announcements every day - do breathing exercises every day - eliminate symptom triggers at home - follow rescue plan if symptoms flare-up - use an extra pillow to sleep - develop a new routine to improve sleep - don't eat or exercise right before bedtime - eat healthy/prescribed diet: heart healthy/ADA - get at least 7 to 8 hours of sleep at night - use devices that will help like a cane, sock-puller or reacher - practice relaxation or meditation daily - do exercises in a comfortable position that makes breathing as easy as possible check blood pressure daily choose a place to take my blood pressure (home, clinic or office, retail store) write blood pressure results in a log or diary learn about high blood pressure keep a blood pressure log take blood pressure log to all doctor appointments call doctor for signs and symptoms of high blood pressure develop an action plan for high blood pressure keep all doctor appointments take medications for blood pressure exactly as  prescribed begin an exercise program report new symptoms to your doctor eat more whole grains, fruits and vegetables, lean meats and  healthy fats - call for medicine refill 2 or 3 days before it runs out - take all medications exactly as prescribed - call doctor with any symptoms you believe are related to your medicine - call doctor when you experience any new symptoms - go to all doctor appointments as scheduled       Plan:Telephone follow up appointment with care management team member scheduled for:  06-28-2021 at 47 am  [place], MSN, [facility] Mobile: [phone]

## 2021-05-10 DIAGNOSIS — I10 Essential (primary) hypertension: Secondary | ICD-10-CM

## 2021-05-10 DIAGNOSIS — Z794 Long term (current) use of insulin: Secondary | ICD-10-CM | POA: Diagnosis not present

## 2021-05-10 DIAGNOSIS — E782 Mixed hyperlipidemia: Secondary | ICD-10-CM

## 2021-05-10 DIAGNOSIS — E1122 Type 2 diabetes mellitus with diabetic chronic kidney disease: Secondary | ICD-10-CM

## 2021-05-10 DIAGNOSIS — E1165 Type 2 diabetes mellitus with hyperglycemia: Secondary | ICD-10-CM | POA: Diagnosis not present

## 2021-05-10 DIAGNOSIS — J449 Chronic obstructive pulmonary disease, unspecified: Secondary | ICD-10-CM | POA: Diagnosis not present

## 2021-05-10 DIAGNOSIS — N183 Chronic kidney disease, stage 3 unspecified: Secondary | ICD-10-CM

## 2021-05-10 DIAGNOSIS — I25118 Atherosclerotic heart disease of native coronary artery with other forms of angina pectoris: Secondary | ICD-10-CM | POA: Diagnosis not present

## 2021-05-12 ENCOUNTER — Other Ambulatory Visit: Payer: Self-pay | Admitting: Family Medicine

## 2021-05-13 DIAGNOSIS — N261 Atrophy of kidney (terminal): Secondary | ICD-10-CM | POA: Diagnosis not present

## 2021-05-13 DIAGNOSIS — N3020 Other chronic cystitis without hematuria: Secondary | ICD-10-CM | POA: Diagnosis not present

## 2021-05-13 DIAGNOSIS — Q6211 Congenital occlusion of ureteropelvic junction: Secondary | ICD-10-CM | POA: Diagnosis not present

## 2021-05-13 NOTE — Telephone Encounter (Signed)
Labs addressed in provider note. ?Requested Prescriptions  ?Pending Prescriptions Disp Refills  ?? glipiZIDE (GLUCOTROL) 5 MG tablet [Pharmacy Med Name: GLIPIZIDE 5 MG TAB] 90 tablet 1  ?  Sig: TAKE 1 TABLET BY MOUTH DAILY BEFORE BREAKFAST  ?  ? Endocrinology:  Diabetes - Sulfonylureas Failed - 05/12/2021  6:23 PM  ?  ?  Failed - HBA1C is between 0 and 7.9 and within 180 days  ?  Hemoglobin A1C  ?Date Value Ref Range Status  ?01/31/2016 8.7  Final  ? ?HB A1C (BAYER DCA - WAIVED)  ?Date Value Ref Range Status  ?02/15/2021 8.0 (H) 4.8 - 5.6 % Final  ?  Comment:  ?           Prediabetes: 5.7 - 6.4 ?         Diabetes: >6.4 ?         Glycemic control for adults with diabetes: <7.0 ?  ?   ?  ?  Failed - Cr in normal range and within 360 days  ?  Creatinine  ?Date Value Ref Range Status  ?09/20/2011 1.31 (H) 0.60 - 1.30 mg/dL Final  ? ?Creatinine, Ser  ?Date Value Ref Range Status  ?03/23/2021 1.55 (H) 0.57 - 1.00 mg/dL Final  ?   ?  ?  Passed - Valid encounter within last 6 months  ?  Recent Outpatient Visits   ?      ? 1 month ago Uncontrolled type 2 diabetes mellitus with hyperglycemia, with long-term current use of insulin ([place])  ? [place], [place] P, DO  ? 2 months ago Upper respiratory tract infection, unspecified type  ? [facility], Lauren A, NP  ? 2 months ago Temporal pain  ? [place] P, DO  ? 2 months ago Temporal pain  ? [place], Megan P, DO  ? 2 months ago Type 2 diabetes mellitus with stage 3 chronic kidney disease, with long-term current use of insulin, unspecified whether stage 3a or 3b CKD ([place])  ? [place], [place] P, DO  ?  ?  ?Future Appointments   ?        ? In 1 month Wynetta Emery, Barb Merino, DO [employer], [facility]  ? In 7 months  [place], [facility]  ?  ? ?  ?  ?  ? ?

## 2021-05-17 DIAGNOSIS — M5416 Radiculopathy, lumbar region: Secondary | ICD-10-CM | POA: Diagnosis not present

## 2021-05-17 DIAGNOSIS — M5136 Other intervertebral disc degeneration, lumbar region: Secondary | ICD-10-CM | POA: Diagnosis not present

## 2021-05-23 ENCOUNTER — Ambulatory Visit (INDEPENDENT_AMBULATORY_CARE_PROVIDER_SITE_OTHER): Payer: Medicare Other

## 2021-05-23 DIAGNOSIS — E1165 Type 2 diabetes mellitus with hyperglycemia: Secondary | ICD-10-CM

## 2021-05-23 DIAGNOSIS — Z794 Long term (current) use of insulin: Secondary | ICD-10-CM

## 2021-05-23 DIAGNOSIS — J449 Chronic obstructive pulmonary disease, unspecified: Secondary | ICD-10-CM

## 2021-05-23 NOTE — Progress Notes (Cosign Needed)
Chronic Care Management Pharmacy Note  05/23/2021 Name:  Tammy [name] MRN:  [id number] DOB:  06/13/1945  Summary: Tammy [name] 25 mg daily (held dec 2022-jan 2023, then urology advised stopping d/t recurring UTI) Under 200 FBG, most recent 160s Diet handouts provided, working to cut down on carb rich foods including bread Basal insulin - reports 30 units daily, weight 98 kg. Currently at 0.3 units/kg basal, recommend to further titrate basal insulin 2 units every other day until FBG <130. Next PCP visit 06/21/2021 Subjective: Tammy [name] is an 76 y.o. year old female who is a primary patient of [name], DO.  The CCM team was consulted for assistance with disease management and care coordination needs.    Engaged with patient by telephone for follow up visit in response to provider referral for pharmacy case management and/or care coordination services.   Consent to Services:  The patient was given information about Chronic Care Management services, agreed to services, and gave verbal consent prior to initiation of services.  Please see initial visit note for detailed documentation.   Patient Care Team: [name], DO as PCP - General (Family Medicine) [name], MD as PCP - Cardiology (Cardiology) [name], MD as Consulting Physician (Cardiology) [name], MD as Consulting Physician (Urology) [name], MD as Consulting Physician (Nephrology) [name], MD as Referring Physician (Orthopedic Surgery) [name], MD as Attending Physician (Endocrinology) [name], MD as Referring Physician (Vascular Surgery) [name], [facility] (Inactive) as Pharmacist (Pharmacist) [name], RN as Case Manager ([place])  Hospital visits: None in previous 6 months  Objective:  Lab Results  Component Value Date   CREATININE 1.55 (H) 03/23/2021   CREATININE 1.70 (H) 02/15/2021   CREATININE 1.77 (H) 01/17/2021    Lab Results   Component Value Date   HGBA1C 8.0 (H) 02/15/2021   HGBA1C 7.9 (H) 08/19/2020   HGBA1C 7.7 (H) 05/11/2020   Last diabetic Eye exam:  Lab Results  Component Value Date/Time   HMDIABEYEEXA No Retinopathy 11/09/2020 12:00 AM    Last diabetic Foot exam: No results found for: HMDIABFOOTEX      Component Value Date/Time   CHOL 161 02/15/2021 1019   CHOL 152 08/19/2020 1015   CHOL 122 11/11/2019 1424   CHOL 152 01/31/2016 1419   TRIG 204 (H) 02/15/2021 1019   TRIG 165 (H) 08/19/2020 1015   TRIG 175 (H) 11/11/2019 1424   TRIG 224 (H) 01/31/2016 1419   HDL 49 02/15/2021 1019   HDL 53 08/19/2020 1015   HDL 40 11/11/2019 1424   CHOLHDL 3.2 09/07/2015 0805   CHOLHDL 3.4 12/17/2014 1017   VLDL 45 (H) 01/31/2016 1419   LDLCALC 78 02/15/2021 1019   LDLCALC 71 08/19/2020 1015   LDLCALC 53 11/11/2019 1424    Hepatic Function Latest Ref Rng & Units 02/15/2021 10/28/2020 09/30/2020  Total Protein 6.0 - 8.5 g/dL 6.7 7.2 6.7  Albumin 3.7 - 4.7 g/dL 4.1 4.5 4.3  AST 0 - 40 IU/L 22 14 13   ALT 0 - 32 IU/L 16 10 10   Alk Phosphatase 44 - 121 IU/L 90 92 104  Total Bilirubin 0.0 - 1.2 mg/dL 0.3 0.4 0.3  Bilirubin, Direct 0.0 - 0.2 mg/dL - - -    Lab Results  Component Value Date/Time   TSH 0.510 03/23/2021 11:27 AM   TSH 0.865 11/11/2019 02:24 PM    CBC Latest Ref Rng & Units 02/15/2021 01/17/2021  11/02/2020  WBC 3.4 - 10.8 x10E3/uL 8.6 8.2 8.0  Hemoglobin 11.1 - 15.9 g/dL 13.2 13.3 12.6  Hematocrit 34.0 - 46.6 % 42.2 43.1 39.4  Platelets 150 - 450 x10E3/uL 312 312 274    Lab Results  Component Value Date/Time   VD25OH 32.6 04/08/2019 04:38 PM    Clinical ASCVD:  The 10-year ASCVD risk score (Arnett DK, et al., 2019) is: 34.6%   Values used to calculate the score:     Age: 76 years     Sex: Female     Is Non-Hispanic African American: No     Diabetic: Yes     Tobacco smoker: No     Systolic Blood Pressure: 654 mmHg     Is BP treated: Yes     HDL Cholesterol: 49 mg/dL     Total  Cholesterol: 161 mg/dL   Social History   Tobacco Use  Smoking Status Former   Packs/day: 0.50   Years: 15.00   Pack years: 7.50   Types: Cigarettes   Quit date: 03/14/2007   Years since quitting: 14.2  Smokeless Tobacco Never   BP Readings from Last 3 Encounters:  04/18/21 117/77  03/23/21 103/70  03/11/21 (!) 148/83   Pulse Readings from Last 3 Encounters:  04/18/21 88  03/23/21 76  03/11/21 84   Wt Readings from Last 3 Encounters:  04/18/21 217 lb (98.4 kg)  03/23/21 216 lb (98 kg)  03/01/21 216 lb 12.8 oz (98.3 kg)   BMI Readings from Last 3 Encounters:  04/18/21 35.02 kg/m  03/23/21 35.94 kg/m  03/01/21 36.08 kg/m    CAT ASSESSMENT  Rank each of the following items on a scale of 0 to 5 (with 5 being most severe) Write a # 0-5 in each box  I never cough (0) > I cough all the time (5) 0  I have no phlegm (mucus) in my chest (0) > My chest is completely full of phlegm (mucus) (5) 0  My chest does not feel tight at all (0) > My chest feels very tight (5) 0  When I walk up a hill or one flight of stairs I am not breathless (0) > When I walk up a hill or one flight of stairs I am very breathless (5) 5  I am not limited doing any activities at home (0) > I am very limited doing activities at home (5) 0  I am confident leaving my home despite my lung function (0) > I am not at all confident leaving my home because of my lung condition (5)  0  I sleep soundly (0) > I don't sleep soundly because of my lung condition (5) 0  I have lots of energy (0) > I have no energy at all (5) 4   Total CAT Score: 9 Assessment: Review of patient past medical history, allergies, medications, health status, including review of consultants reports, laboratory and other test data, was performed as part of comprehensive evaluation and provision of chronic care management services.   SDOH:  (Social Determinants of Health) assessments and interventions performed: Yes   CCM Care  Plan  Allergies  Allergen Reactions   Hydrocodone Other (See Comments)    Unresponsive   Aleve [Naproxen Sodium]     Hives & itching    Contrast Media [Iodinated Contrast Media]     Pt avoids due to kidney issues   Ioxaglate Other (See Comments)    (iodine) Pt avoids due to kidney issues  Oxycodone Other (See Comments)    hallucinations   Ranexa [Ranolazine]     Sick on stomach   Sulfa Antibiotics Other (See Comments)    Pt avoids due to kidney issues   Tramadol Other (See Comments)    nightmares    Medications Reviewed Today     Reviewed by [name], RN (Case Manager) on 04/26/21 at 1128  Med List Status: <None>   Medication Order Taking? Sig Documenting Provider Last Dose Status Informant  albuterol (VENTOLIN HFA) 108 (90 Base) MCG/ACT inhaler [id number] No Inhale 2 puffs into the lungs every 6 (six) hours as needed for wheezing or shortness of breath. Johnson, Megan P, DO Taking Active   anastrozole (ARIMIDEX) 1 MG tablet [id number] No Take 1 tablet (1 mg total) by mouth daily. Cammie Sickle, MD Taking Active   apixaban (ELIQUIS) 2.5 MG TABS tablet [id number] No Take 1 tablet (2.5 mg total) by mouth 2 (two) times daily. Park Liter P, DO Taking Active   blood glucose meter kit and supplies KIT [id number] No Dispense based on patient and insurance preference. Use one time daily as directed, E11.9 Lada, Satira Anis, MD Taking Active Other  Calcium Carb-Cholecalciferol (CALCIUM 600 + D) 600-200 MG-UNIT TABS [id number] No Take 1 tablet by mouth daily.  [provider] Taking Active Other  cephALEXin (KEFLEX) 250 MG capsule [id number] No Take 250 mg by mouth daily. [provider] Taking Active   cetirizine (ZYRTEC) 10 MG tablet [id number] No TAKE 1 TABLET BY MOUTH DAILY Johnson, Megan P, DO Taking Active   cholecalciferol (VITAMIN D3) 25 MCG (1000 UT) tablet [id number] No Take 1,000 Units by mouth daily. [provider] Taking Active Other   CRANBERRY PO [id number] No Take 2 capsules by mouth daily. [provider] Taking Active   DULoxetine (CYMBALTA) 60 MG capsule [id number] No Take 2 capsules (120 mg total) by mouth daily. Johnson, Megan P, DO Taking Active   empagliflozin (JARDIANCE) 25 MG TABS tablet [id number] No Take 1 tablet (25 mg total) by mouth daily. TAKE ONE TABLET 25MG EVERY DAY BEFORE BREAKFAST Johnson, Megan P, DO Taking Active   gabapentin (NEURONTIN) 300 MG capsule [id number] No Take 1 capsule (300 mg total) by mouth 3 (three) times daily. Johnson, Megan P, DO Taking Active   glipiZIDE (GLUCOTROL) 5 MG tablet [id number] No TAKE ONE TABLET BY MOUTH EVERY DAY BEFORE BREAKFAST Johnson, Megan P, DO Taking Active   insulin degludec (TRESIBA FLEXTOUCH) 100 UNIT/ML FlexTouch Pen [id number] No Inject 28 Units into the skin at bedtime. Park Liter P, DO Taking Active   Insulin Pen Needle (PEN NEEDLES 3/16") 31G X 5 MM MISC [id number] No 1 each by Does not apply route daily. Park Liter P, DO Taking Active Other  isosorbide mononitrate (IMDUR) 60 MG 24 hr tablet [id number] No TAKE ONE TABLET BY MOUTH AT BEDTIME Wynetta Emery, Megan P, DO Taking Active   Lactobacillus (PROBIOTIC ACIDOPHILUS PO) [id number] No Take 1 capsule by mouth daily at 12 noon. [provider] Taking Active   metoprolol succinate (TOPROL-XL) 50 MG 24 hr tablet [id number] No TAKE 1 TABLET BY MOUTH DAILY WITH OR IMMEDIATLEY FOLLOWING A MEAL Johnson, Megan P, DO Taking Active   montelukast (SINGULAIR) 10 MG tablet [id number] No TAKE 1 TABLET BY MOUTH AT BEDTIME [name], DO Taking Active   MYRBETRIQ 50 MG TB24 tablet [id number] No TAKE 1 TABLET BY MOUTH DAILY Vaillancourt, Samantha, PA-C Taking Active   nitroGLYCERIN (NITROSTAT) 0.4 MG SL tablet [id number]  No Place 1 tablet (0.4 mg total) under the tongue every 5 (five) minutes as needed for chest pain. Total of 3 doses Theora Gianotti, NP Taking Active   pantoprazole (PROTONIX) 40 MG  tablet [id number] No Take 1 tablet (40 mg total) by mouth daily. Johnson, Megan P, DO Taking Active   Pumpkin Seed-Soy Germ (AZO BLADDER CONTROL/GO-LESS PO) [id number] No Take 1 tablet by mouth in the morning and at bedtime. [provider] Taking Active   rosuvastatin (CRESTOR) 40 MG tablet [id number] No Take 1 tablet (40 mg total) by mouth daily. Johnson, Megan P, DO Taking Active   senna (SENOKOT) 8.6 MG TABS tablet [id number] No Take 8.6 mg by mouth daily as needed for mild constipation. [provider] Taking Active Other  SYNTHROID 150 MCG tablet [id number] No TAKE ONE TABLET ON AN EMPTY STOMACH WITHA GLASS OF WATER AT LEAST 30 TO 60 MINUTES BEFORE BREAKFAST Johnson, Megan P, DO Taking Active   TRELEGY ELLIPTA 100-62.5-25 MCG/ACT AEPB [id number] No Inhale 1 puff into the lungs daily. [provider] Taking Active   trimethoprim (TRIMPEX) 100 MG tablet [id number] No TAKE ONE TABLET BY MOUTH EVERY DAY [name], DO Taking Active   UNABLE TO FIND [id number] No Med Name: Energy, memory and mood enhancer [provider] Taking Active   ZINC CITRATE PO [id number] No Take by mouth. [provider] Taking Active             Patient Active Problem List   Diagnosis Date Noted   Intermittent claudication ([facility]) 02/15/2021   Cervical radiculopathy 10/14/2020   Sacroiliac inflammation ([place]) 10/14/2020   Lung nodule 08/19/2020   Fall 04/17/2020   Anemia 02/16/2020   Asthma 02/16/2020   History of cardiac catheterization 02/16/2020   History of left mastectomy 02/16/2020   Malignant tumor of breast ([place]) 02/16/2020   History of total knee arthroplasty 11/18/2019   Unstable angina ([place]) 04/03/2019   Dysphagia    Thickening of esophagus    Stomach irritation    Gastric polyp    Positive occult stool blood test    Polyp of colon    Hyperkalemia 11/27/2018   Proteinuria 11/27/2018   Type 2 diabetes mellitus with stage 3 chronic kidney disease ([place])  11/27/2018   Carcinoma of lower-inner quadrant of left breast in female, estrogen receptor positive ([place]) 10/02/2018   Right knee pain 08/01/2017   Chronic right-sided low back pain with right-sided sciatica 05/28/2017   Gait abnormality 05/28/2017   Hypothyroid 07/18/2016   COPD (chronic obstructive pulmonary disease) ([place]) 07/18/2016   Encephalomalacia on imaging study 12/10/2015   Calcification of both carotid arteries 12/10/2015   Benign neoplasm of sigmoid colon    Benign neoplasm of cecum    Benign neoplasm of ascending colon    Bilateral atelectasis 04/11/2015   Fatty liver disease, nonalcoholic 30/16/0109   Hydronephrosis of right kidney 04/11/2015   Abdominal aortic ectasia ([place]) 04/11/2015   Constipation 04/07/2015   Abdominal aortic atherosclerosis ([place]) 12/18/2014   Diverticulosis of colon 12/18/2014   Rhinitis, allergic 10/20/2014   Coronary artery disease    Essential hypertension    Morbid obesity due to excess calories ([place])    Spinal stenosis of lumbar region 06/06/2014   Herniated nucleus pulposus 06/06/2014   Neuralgia neuritis, sciatic nerve 05/24/2014   Pulmonary embolism ([place]) 01/30/2014   Benign hypertensive renal disease 12/16/2013   Uncontrolled type 2 diabetes mellitus with hyperglycemia, with long-term current use of insulin ([place]) 12/16/2013  Chronic kidney disease 12/16/2013   Frequent UTI 11/03/2013   Overactive bladder 11/03/2013   Coronary atherosclerosis of native coronary artery 06/19/2013   History of back surgery 06/19/2013   Hyperlipidemia 06/19/2013   PAD (peripheral artery disease) ([place]) 06/19/2013   Congenital obstruction of ureteropelvic junction 11/21/2011    Immunization History  Administered Date(s) Administered   Fluad Quad(high Dose 65+) 01/02/2019, 02/10/2020, 12/30/2020   Influenza, High Dose Seasonal PF 11/16/2016, 12/18/2017   Influenza,inj,Quad PF,6+ Mos 12/16/2014, 11/24/2015   PFIZER(Purple Top)SARS-COV-2 Vaccination  05/08/2019, 05/29/2019   Pneumococcal Conjugate-13 07/08/2014   Pneumococcal Polysaccharide-23 03/13/2012   Zoster, Live 03/13/2010    Conditions to be addressed/monitored: T2DM, chronic pain, frequent UTIs, COPD, HTN  Care Plan : [place] plan  Updates made by Madelin Rear, [facility] since 05/23/2021 12:00 AM     Problem: DM, CKD,  HTN, HD, hypothyroidism, H/o PE/DVT, overactive bladder, CAD, PVD   Note:    Current Barriers:  Unable to independently afford treatment regimen Unable to independently monitor therapeutic efficacy  Pharmacist Clinical Goal(s):  Patient will pursue eliquis patient sistance through collaboration with PharmD and provider.   Interventions: 1:1 collaboration with [name], DO regarding development and update of comprehensive plan of care as evidenced by provider attestation and co-signature Inter-disciplinary care team collaboration (see longitudinal plan of care) Comprehensive medication review performed; medication list updated in electronic medical record  Diabetes (A1c goal <8%) -Not ideally controlled -GFRs stable in low 30s last jan dec nov - no change once off Jardiance -Current medications: Trulicity 4.5 mg once weekly Tresiba 30 units daily  Glipizide 5 mg once daily -Previous meds: metformin (GFR), Jardiance 25 mg daily (held dec-jan 2023, urology advised stopping d/t recurring UTI following pcp visit) -Current home glucose readings fasting glucose: typically 160-170, reports previous missed dose of tresiba leading to reading in upper 300s was very rare.  -Denies hypoglycemic/hyperglycemic symptoms. Only one episode that she can recall, has  -Current meal patterns: tries to be consistent with meals, avoids sweets. Husband often brining meals home.   -Current exercise: limited due to back/knee pain - leg still hurt after surgery. Getting in pool for water walking would likely be better on knee but fall risk is a concern  -Educated  on A1c and blood sugar goals; -Counseled to check feet daily and get yearly eye exams -Recommended to continue current medication  COPD (Goal: control symptoms and prevent exacerbations) -Controlled -Current treatment  Trelegy once daily (started end of  2022) Proair as needed -Gold Grade: Gold 2 (FEV1 50-79%) -Current COPD Classification:  A (low sx, <2 exacerbations/yr) -MMRC/CAT score: 9 (oct, 2022) -Pulmonary function testing: 2021 -Exacerbations requiring treatment in last 6 months: 0 -Patient denies consistent use of maintenance inhaler -Frequency of rescue inhaler use: 1-3x/wk -Counseled on Proper inhaler technique; When to use rescue inhaler -Recommended to continue current medication Assessed pt finances - see med assistance below.   Patient Goals/Self-Care Activities Patient will:  - take medications as prescribed  Medication Assistance: doing ok with current rx copays, husband is working variable hours still so uncertain about being able to qualify for PAP. If assistance is needed moving forward, will let us know. Programs that may be useful in future exist for Trelegy, Tresiba, Trulicity.   Patient's preferred pharmacy is:  [facility] - [place], [place] - [address] [place] [address] Phone: [phone] Fax: [phone]    Pt endorses 100% compliance  Follow Up:  Patient agrees to Care Plan and Follow-up. Plan: HC 1 month f/u on pap completion and DM. Pharmacist 3 month f/u     Long-Range Goal: disease management   Start Date: 12/13/2020  Expected End Date: 12/13/2021  Recent Progress: On track  Priority: High  Note:    Current Barriers:  Unable to independently afford treatment regimen Unable to independently monitor therapeutic efficacy  Pharmacist Clinical Goal(s):  Patient will pursue eliquis patient sistance through collaboration with PharmD and provider.   Interventions: 1:1 collaboration with [name],  DO regarding development and update of comprehensive plan of care as evidenced by provider attestation and co-signature Inter-disciplinary care team collaboration (see longitudinal plan of care) Comprehensive medication review performed; medication list updated in electronic medical record  Diabetes (A1c goal <8%) -Not ideally controlled -GFRs stable in low 30s last jan dec nov - no change once off Jardiance -Current medications: Trulicity 4.5 mg once weekly Tresiba 30 units daily  Glipizide 5 mg once daily -Previous meds: metformin (GFR), Jardiance 25 mg daily (held dec-jan 2023, urology advised stopping d/t recurring UTI following pcp visit) -Current home glucose readings fasting glucose: typically 160-170, reports previous missed dose of tresiba leading to reading in upper 300s was very rare.  -Denies hypoglycemic/hyperglycemic symptoms. Only one episode that she can recall, has  -Current meal patterns: tries to be consistent with meals, avoids sweets. Husband often brining meals home.   -Current exercise: limited due to back/knee pain - leg still hurt after surgery. Getting in pool for water walking would likely be better on knee but fall risk is a concern  -Educated on A1c and blood sugar goals; -Counseled to check feet daily and get yearly eye exams -Recommended to continue current medication  COPD (Goal: control symptoms and prevent exacerbations) -Controlled -Current treatment  Trelegy once daily (started end of  2022) Proair as needed -Gold Grade: Gold 2 (FEV1 50-79%) -Current COPD Classification:  A (low sx, <2 exacerbations/yr) -MMRC/CAT score: 9 (oct, 2022) -Pulmonary function testing: 2021 -Exacerbations requiring treatment in last 6 months: 0 -Patient denies consistent use of maintenance inhaler -Frequency of rescue inhaler use: 1-3x/wk -Counseled on Proper inhaler technique; When to use rescue inhaler -Recommended to continue current medication Assessed pt finances  - see med assistance below.   Patient Goals/Self-Care Activities Patient will:  - take medications as prescribed  Medication Assistance: doing ok with current rx copays, husband is working variable hours still so uncertain about being able to qualify for PAP. If assistance is needed moving forward, will let us know. Programs that may be useful in future exist for Trelegy, Tresiba, Trulicity.   Patient's preferred pharmacy is:  [facility] - [place], [place] - [address] [place] [address] Phone: [phone] Fax: [phone]    Pt endorses 100% compliance  Follow Up:  Patient agrees to Care Plan and Follow-up. Plan: HC 1 month f/u DM call. Pharmacist 3 month f/u     Future Appointments  Date Time Provider [place]  06/15/2021  1:00 PM OPIC-CT OPIC-CT OPIC-Outpati  06/21/2021  1:40 PM [name], DO CFP-CFP PEC  06/22/2021  2:00 PM [name], MD [facility]-BRT None  06/27/2021  1:15 PM CCAR-MO LAB [facility]-BOC None  06/27/2021  1:45 PM Cammie Sickle, MD [facility]-BOC None  06/28/2021 10:30 AM CFP CCM CASE MANAGER CFP-CFP PEC  07/20/2021 12:45 PM Hayden Pedro, MD TRE-TRE None  10/04/2021  2:00 PM AVVS VASC 3 AVVS-IMG None  10/04/2021  3:00 PM Dew, Erskine Squibb, MD AVVS-AVVS None  12/23/2021  2:30 PM CFP NURSE HEALTH ADVISOR CFP-CFP [place], PharmD, BCGP Clinical Pharmacist  [phone]

## 2021-05-23 NOTE — Patient Instructions (Addendum)
[name],  Thank you for talking with me today. I have included our care plan/goals in the following pages.   Please review and call me at [phone] with any questions.  Thanks! Ellin Mayhew, PharmD Clinical Pharmacist  [phone]  Care Plan : [place] plan  Updates made by Madelin Rear, [facility] since 05/23/2021 12:00 AM     Problem: DM, CKD,  HTN, HD, hypothyroidism, H/o PE/DVT, overactive bladder, CAD, PVD   Note:    Current Barriers:  Unable to independently afford treatment regimen Unable to independently monitor therapeutic efficacy  Pharmacist Clinical Goal(s):  Patient will pursue eliquis patient sistance through collaboration with PharmD and provider.   Interventions: 1:1 collaboration with [name], DO regarding development and update of comprehensive plan of care as evidenced by provider attestation and co-signature Inter-disciplinary care team collaboration (see longitudinal plan of care) Comprehensive medication review performed; medication list updated in electronic medical record  Diabetes (A1c goal <8%) -Not ideally controlled -GFRs stable in low 30s last jan dec nov - no change once off Jardiance -Current medications: Trulicity 4.5 mg once weekly Tresiba 30 units daily  Glipizide 5 mg once daily -Previous meds: metformin (GFR), Jardiance 25 mg daily (held dec-jan 2023, urology advised stopping d/t recurring UTI following pcp visit) -Current home glucose readings fasting glucose: typically 160-170, reports previous missed dose of tresiba leading to reading in upper 300s was very rare.  -Denies hypoglycemic/hyperglycemic symptoms. Only one episode that she can recall, has  -Current meal patterns: tries to be consistent with meals, avoids sweets. Husband often brining meals home.   -Current exercise: limited due to back/knee pain - leg still hurt after surgery. Getting in pool for water walking would likely be better on knee but  fall risk is a concern  -Educated on A1c and blood sugar goals; -Counseled to check feet daily and get yearly eye exams -Recommended to continue current medication  COPD (Goal: control symptoms and prevent exacerbations) -Controlled -Current treatment  Trelegy once daily (started end of  2022) Proair as needed -Gold Grade: Gold 2 (FEV1 50-79%) -Current COPD Classification:  A (low sx, <2 exacerbations/yr) -MMRC/CAT score: 9 (oct, 2022) -Pulmonary function testing: 2021 -Exacerbations requiring treatment in last 6 months: 0 -Patient denies consistent use of maintenance inhaler -Frequency of rescue inhaler use: 1-3x/wk -Counseled on Proper inhaler technique; When to use rescue inhaler -Recommended to continue current medication Assessed pt finances - see med assistance below.   Patient Goals/Self-Care Activities Patient will:  - take medications as prescribed  Medication Assistance: doing ok with current rx copays, husband is working variable hours still so uncertain about being able to qualify for PAP. If assistance is needed moving forward, will let us know. Programs that may be useful in future exist for Trelegy, Tresiba, Trulicity.   Patient's preferred pharmacy is:  [facility] - [place], [place] - [address] [place] [address] Phone: [phone] Fax: [phone]    Pt endorses 100% compliance  Follow Up:  Patient agrees to Care Plan and Follow-up. Plan: HC 1 month f/u on pap completion and DM. Pharmacist 3 month f/u     Long-Range Goal: disease management   Start Date: 12/13/2020  Expected End Date: 12/13/2021  Recent Progress: On track  Priority: High  Note:    Current Barriers:  Unable to independently afford treatment regimen Unable to independently monitor therapeutic efficacy  Pharmacist Clinical Goal(s):  Patient will pursue  eliquis patient sistance through collaboration with PharmD and provider.   Interventions: 1:1  collaboration with [name], DO regarding development and update of comprehensive plan of care as evidenced by provider attestation and co-signature Inter-disciplinary care team collaboration (see longitudinal plan of care) Comprehensive medication review performed; medication list updated in electronic medical record  Diabetes (A1c goal <8%) -Not ideally controlled -GFRs stable in low 30s last jan dec nov - no change once off Jardiance -Current medications: Trulicity 4.5 mg once weekly Tresiba 30 units daily  Glipizide 5 mg once daily -Previous meds: metformin (GFR), Jardiance 25 mg daily (held dec-jan 2023, urology advised stopping d/t recurring UTI following pcp visit) -Current home glucose readings fasting glucose: typically 160-170, reports previous missed dose of tresiba leading to reading in upper 300s was very rare.  -Denies hypoglycemic/hyperglycemic symptoms. Only one episode that she can recall, has  -Current meal patterns: tries to be consistent with meals, avoids sweets. Husband often brining meals home.   -Current exercise: limited due to back/knee pain - leg still hurt after surgery. Getting in pool for water walking would likely be better on knee but fall risk is a concern  -Educated on A1c and blood sugar goals; -Counseled to check feet daily and get yearly eye exams -Recommended to continue current medication  COPD (Goal: control symptoms and prevent exacerbations) -Controlled -Current treatment  Trelegy once daily (started end of  2022) Proair as needed -Gold Grade: Gold 2 (FEV1 50-79%) -Current COPD Classification:  A (low sx, <2 exacerbations/yr) -MMRC/CAT score: 9 (oct, 2022) -Pulmonary function testing: 2021 -Exacerbations requiring treatment in last 6 months: 0 -Patient denies consistent use of maintenance inhaler -Frequency of rescue inhaler use: 1-3x/wk -Counseled on Proper inhaler technique; When to use rescue inhaler -Recommended to continue  current medication Assessed pt finances - see med assistance below.   Patient Goals/Self-Care Activities Patient will:  - take medications as prescribed  Medication Assistance: doing ok with current rx copays, husband is working variable hours still so uncertain about being able to qualify for PAP. If assistance is needed moving forward, will let us know. Programs that may be useful in future exist for Trelegy, Tresiba, Trulicity.   Patient's preferred pharmacy is:  [facility] - [place], [place] - [address] [place] [address] Phone: [phone] Fax: [phone]    Pt endorses 100% compliance  Follow Up:  Patient agrees to Care Plan and Follow-up. Plan: HC 1 month f/u DM call. Pharmacist 3 month f/u      The patient verbalized understanding of instructions provided today and agreed to receive a MyChart copy of patient instruction and/or educational materials. Telephone follow up appointment with pharmacy team member scheduled for: See next appointment with "Care Management Staff" under "What's Next" below.     Diabetes Mellitus and Nutrition, Adult When you have diabetes, or diabetes mellitus, it is very important to have healthy eating habits because your blood sugar (glucose) levels are greatly affected by what you eat and drink. Eating healthy foods in the right amounts, at about the same times every day, can help you: Manage your blood glucose. Lower your risk of heart disease. Improve your blood pressure. Reach or maintain a healthy weight. What can affect my meal plan? Every person with diabetes is different, and each person has different needs for a meal plan. Your health care provider may recommend that you work with a dietitian to make a meal plan that is best for  you. Your meal plan may vary depending on factors such as: The calories you need. The medicines you take. Your weight. Your blood glucose, blood pressure, and cholesterol  levels. Your activity level. Other health conditions you have, such as heart or kidney disease. How do carbohydrates affect me? Carbohydrates, also called carbs, affect your blood glucose level more than any other type of food. Eating carbs raises the amount of glucose in your blood. It is important to know how many carbs you can safely have in each meal. This is different for every person. Your dietitian can help you calculate how many carbs you should have at each meal and for each snack. How does alcohol affect me? Alcohol can cause a decrease in blood glucose (hypoglycemia), especially if you use insulin or take certain diabetes medicines by mouth. Hypoglycemia can be a life-threatening condition. Symptoms of hypoglycemia, such as sleepiness, dizziness, and confusion, are similar to symptoms of having too much alcohol. Do not drink alcohol if: Your health care provider tells you not to drink. You are pregnant, may be pregnant, or are planning to become pregnant. If you drink alcohol: Limit how much you have to: 0-1 drink a day for women. 0-2 drinks a day for men. Know how much alcohol is in your drink. In the [place] one drink equals one 12 oz bottle of beer (355 mL), one 5 oz glass of wine (148 mL), or one 1 oz glass of hard liquor (44 mL). Keep yourself hydrated with water, diet soda, or unsweetened iced tea. Keep in mind that regular soda, juice, and other mixers may contain a lot of sugar and must be counted as carbs. What are tips for following this plan? Reading food labels Start by checking the serving size on the Nutrition Facts label of packaged foods and drinks. The number of calories and the amount of carbs, fats, and other nutrients listed on the label are based on one serving of the item. Many items contain more than one serving per package. Check the total grams (g) of carbs in one serving. Check the number of grams of saturated fats and trans fats in one serving. Choose foods  that have a low amount or none of these fats. Check the number of milligrams (mg) of salt (sodium) in one serving. Most people should limit total sodium intake to less than 2,300 mg per day. Always check the nutrition information of foods labeled as "low-fat" or "nonfat." These foods may be higher in added sugar or refined carbs and should be avoided. Talk to your dietitian to identify your daily goals for nutrients listed on the label. Shopping Avoid buying canned, pre-made, or processed foods. These foods tend to be high in fat, sodium, and added sugar. Shop around the outside edge of the grocery store. This is where you will most often find fresh fruits and vegetables, bulk grains, fresh meats, and fresh dairy products. Cooking Use low-heat cooking methods, such as baking, instead of high-heat cooking methods, such as deep frying. Cook using healthy oils, such as olive, canola, or sunflower oil. Avoid cooking with butter, cream, or high-fat meats. Meal planning Eat meals and snacks regularly, preferably at the same times every day. Avoid going long periods of time without eating. Eat foods that are high in fiber, such as fresh fruits, vegetables, beans, and whole grains. Eat 4-6 oz (112-168 g) of lean protein each day, such as lean meat, chicken, fish, eggs, or tofu. One ounce (oz) (28 g) of lean  protein is equal to: 1 oz (28 g) of meat, chicken, or fish. 1 egg.  cup (62 g) of tofu. Eat some foods each day that contain healthy fats, such as avocado, nuts, seeds, and fish. What foods should I eat? Fruits Berries. Apples. Oranges. Peaches. Apricots. Plums. Grapes. Mangoes. Papayas. Pomegranates. Kiwi. Cherries. Vegetables Leafy greens, including lettuce, spinach, kale, chard, collard greens, mustard greens, and cabbage. Beets. Cauliflower. Broccoli. Carrots. Green beans. Tomatoes. Peppers. Onions. Cucumbers. Brussels sprouts. Grains Whole grains, such as whole-wheat or whole-grain bread,  crackers, tortillas, cereal, and pasta. Unsweetened oatmeal. Quinoa. Brown or wild rice. Meats and other proteins Seafood. Poultry without skin. Lean cuts of poultry and beef. Tofu. Nuts. Seeds. Dairy Low-fat or fat-free dairy products such as milk, yogurt, and cheese. The items listed above may not be a complete list of foods and beverages you can eat and drink. Contact a dietitian for more information. What foods should I avoid? Fruits Fruits canned with syrup. Vegetables Canned vegetables. Frozen vegetables with butter or cream sauce. Grains Refined white flour and flour products such as bread, pasta, snack foods, and cereals. Avoid all processed foods. Meats and other proteins Fatty cuts of meat. Poultry with skin. Breaded or fried meats. Processed meat. Avoid saturated fats. Dairy Full-fat yogurt, cheese, or milk. Beverages Sweetened drinks, such as soda or iced tea. The items listed above may not be a complete list of foods and beverages you should avoid. Contact a dietitian for more information. Questions to ask a health care provider Do I need to meet with a certified diabetes care and education specialist? Do I need to meet with a dietitian? What number can I call if I have questions? When are the best times to check my blood glucose? Where to find more information: American Diabetes Association: diabetes.org Academy of Nutrition and Dietetics: eatright.[employer] of Diabetes and Digestive and Kidney Diseases: [employer] Association of Diabetes Care & Education Specialists: diabeteseducator.org Summary It is important to have healthy eating habits because your blood sugar (glucose) levels are greatly affected by what you eat and drink. It is important to use alcohol carefully. A healthy meal plan will help you manage your blood glucose and lower your risk of heart disease. Your health care provider may recommend that you work with a dietitian to make a meal  plan that is best for you. This information is not intended to replace advice given to you by your health care provider. Make sure you discuss any questions you have with your health care provider. Document Revised: 10/01/2019 Document Reviewed: 10/01/2019 Elsevier Patient Education  [place].

## 2021-06-03 ENCOUNTER — Other Ambulatory Visit: Payer: Self-pay | Admitting: Physician Assistant

## 2021-06-03 ENCOUNTER — Other Ambulatory Visit: Payer: Self-pay | Admitting: Family Medicine

## 2021-06-03 DIAGNOSIS — N3281 Overactive bladder: Secondary | ICD-10-CM

## 2021-06-06 ENCOUNTER — Other Ambulatory Visit: Payer: Self-pay | Admitting: General Surgery

## 2021-06-06 DIAGNOSIS — C50312 Malignant neoplasm of lower-inner quadrant of left female breast: Secondary | ICD-10-CM

## 2021-06-06 DIAGNOSIS — Z1231 Encounter for screening mammogram for malignant neoplasm of breast: Secondary | ICD-10-CM

## 2021-06-06 NOTE — Telephone Encounter (Signed)
Requested Prescriptions  ?Pending Prescriptions Disp Refills  ?? SYNTHROID 150 MCG tablet [Pharmacy Med Name: SYNTHROID 150 MCG TAB] 90 tablet 3  ?  Sig: TAKE ONE TABLET ON AN EMPTY STOMACH WITHA GLASS OF WATER AT LEAST 30 TO 60 MINUTES BEFORE BREAKFAST  ?  ? Endocrinology:  Hypothyroid Agents Passed - 06/03/2021 12:32 PM  ?  ?  Passed - TSH in normal range and within 360 days  ?  TSH  ?Date Value Ref Range Status  ?03/23/2021 0.510 0.450 - 4.500 uIU/mL Final  ?   ?  ?  Passed - Valid encounter within last 12 months  ?  Recent Outpatient Visits   ?      ? 2 months ago Uncontrolled type 2 diabetes mellitus with hyperglycemia, with long-term current use of insulin ([place])  ? [place], [place] P, DO  ? 2 months ago Upper respiratory tract infection, unspecified type  ? [facility], Lauren A, NP  ? 3 months ago Temporal pain  ? [place] P, DO  ? 3 months ago Temporal pain  ? [place], Megan P, DO  ? 3 months ago Type 2 diabetes mellitus with stage 3 chronic kidney disease, with long-term current use of insulin, unspecified whether stage 3a or 3b CKD ([place])  ? [place], [place] P, DO  ?  ?  ?Future Appointments   ?        ? In 2 weeks Wynetta Emery, Barb Merino, DO [place]  ? In 6 months  [place]  ?  ? ?  ?  ?  ? ?

## 2021-06-08 ENCOUNTER — Emergency Department: Payer: Medicare Other

## 2021-06-08 ENCOUNTER — Other Ambulatory Visit: Payer: Self-pay

## 2021-06-08 ENCOUNTER — Observation Stay
Admission: EM | Admit: 2021-06-08 | Discharge: 2021-06-09 | Disposition: A | Discharge status: Home / Self Care | Payer: Medicare Other | Attending: Hospitalist | Admitting: Hospitalist

## 2021-06-08 DIAGNOSIS — Z8673 Personal history of transient ischemic attack (TIA), and cerebral infarction without residual deficits: Secondary | ICD-10-CM | POA: Diagnosis not present

## 2021-06-08 DIAGNOSIS — I5032 Chronic diastolic (congestive) heart failure: Secondary | ICD-10-CM | POA: Insufficient documentation

## 2021-06-08 DIAGNOSIS — Z87891 Personal history of nicotine dependence: Secondary | ICD-10-CM | POA: Diagnosis not present

## 2021-06-08 DIAGNOSIS — E039 Hypothyroidism, unspecified: Secondary | ICD-10-CM | POA: Diagnosis not present

## 2021-06-08 DIAGNOSIS — I959 Hypotension, unspecified: Secondary | ICD-10-CM | POA: Diagnosis not present

## 2021-06-08 DIAGNOSIS — E1165 Type 2 diabetes mellitus with hyperglycemia: Secondary | ICD-10-CM | POA: Diagnosis not present

## 2021-06-08 DIAGNOSIS — R112 Nausea with vomiting, unspecified: Secondary | ICD-10-CM | POA: Diagnosis not present

## 2021-06-08 DIAGNOSIS — I2510 Atherosclerotic heart disease of native coronary artery without angina pectoris: Secondary | ICD-10-CM | POA: Diagnosis not present

## 2021-06-08 DIAGNOSIS — Z95828 Presence of other vascular implants and grafts: Secondary | ICD-10-CM | POA: Diagnosis not present

## 2021-06-08 DIAGNOSIS — R079 Chest pain, unspecified: Secondary | ICD-10-CM | POA: Diagnosis not present

## 2021-06-08 DIAGNOSIS — Z170 Estrogen receptor positive status [ER+]: Secondary | ICD-10-CM | POA: Diagnosis not present

## 2021-06-08 DIAGNOSIS — J45901 Unspecified asthma with (acute) exacerbation: Principal | ICD-10-CM | POA: Insufficient documentation

## 2021-06-08 DIAGNOSIS — I130 Hypertensive heart and chronic kidney disease with heart failure and stage 1 through stage 4 chronic kidney disease, or unspecified chronic kidney disease: Secondary | ICD-10-CM | POA: Insufficient documentation

## 2021-06-08 DIAGNOSIS — N1832 Chronic kidney disease, stage 3b: Secondary | ICD-10-CM

## 2021-06-08 DIAGNOSIS — J9601 Acute respiratory failure with hypoxia: Secondary | ICD-10-CM | POA: Diagnosis not present

## 2021-06-08 DIAGNOSIS — Z86718 Personal history of other venous thrombosis and embolism: Secondary | ICD-10-CM | POA: Diagnosis not present

## 2021-06-08 DIAGNOSIS — R0789 Other chest pain: Secondary | ICD-10-CM | POA: Diagnosis not present

## 2021-06-08 DIAGNOSIS — Z20822 Contact with and (suspected) exposure to covid-19: Secondary | ICD-10-CM | POA: Insufficient documentation

## 2021-06-08 DIAGNOSIS — Z7901 Long term (current) use of anticoagulants: Secondary | ICD-10-CM

## 2021-06-08 DIAGNOSIS — N390 Urinary tract infection, site not specified: Secondary | ICD-10-CM

## 2021-06-08 DIAGNOSIS — Z86711 Personal history of pulmonary embolism: Secondary | ICD-10-CM | POA: Insufficient documentation

## 2021-06-08 DIAGNOSIS — R748 Abnormal levels of other serum enzymes: Secondary | ICD-10-CM

## 2021-06-08 DIAGNOSIS — Z794 Long term (current) use of insulin: Secondary | ICD-10-CM | POA: Insufficient documentation

## 2021-06-08 DIAGNOSIS — Z7984 Long term (current) use of oral hypoglycemic drugs: Secondary | ICD-10-CM | POA: Insufficient documentation

## 2021-06-08 DIAGNOSIS — I10 Essential (primary) hypertension: Secondary | ICD-10-CM | POA: Diagnosis present

## 2021-06-08 DIAGNOSIS — C50312 Malignant neoplasm of lower-inner quadrant of left female breast: Secondary | ICD-10-CM | POA: Insufficient documentation

## 2021-06-08 DIAGNOSIS — J449 Chronic obstructive pulmonary disease, unspecified: Secondary | ICD-10-CM | POA: Insufficient documentation

## 2021-06-08 DIAGNOSIS — Z96651 Presence of right artificial knee joint: Secondary | ICD-10-CM | POA: Diagnosis not present

## 2021-06-08 DIAGNOSIS — R739 Hyperglycemia, unspecified: Secondary | ICD-10-CM

## 2021-06-08 DIAGNOSIS — I739 Peripheral vascular disease, unspecified: Secondary | ICD-10-CM | POA: Diagnosis present

## 2021-06-08 DIAGNOSIS — N189 Chronic kidney disease, unspecified: Secondary | ICD-10-CM

## 2021-06-08 DIAGNOSIS — R0602 Shortness of breath: Secondary | ICD-10-CM

## 2021-06-08 DIAGNOSIS — Z85118 Personal history of other malignant neoplasm of bronchus and lung: Secondary | ICD-10-CM | POA: Diagnosis not present

## 2021-06-08 LAB — URINALYSIS, COMPLETE (UACMP) WITH MICROSCOPIC
Bilirubin Urine: NEGATIVE
Glucose, UA: 50 mg/dL — AB
Ketones, ur: NEGATIVE mg/dL
Nitrite: NEGATIVE
Protein, ur: 100 mg/dL — AB
Specific Gravity, Urine: 1.013 (ref 1.005–1.030)
WBC, UA: >50 WBC/hpf — ABNORMAL HIGH (ref 0–5)
pH: 6 (ref 5.0–8.0)

## 2021-06-08 LAB — CBG MONITORING, ED
Glucose-Capillary: 223 mg/dL — ABNORMAL HIGH (ref 70–99)
Glucose-Capillary: 235 mg/dL — ABNORMAL HIGH (ref 70–99)
Glucose-Capillary: 236 mg/dL — ABNORMAL HIGH (ref 70–99)

## 2021-06-08 LAB — BASIC METABOLIC PANEL
Anion gap: 12 (ref 5–15)
BUN: 25 mg/dL — ABNORMAL HIGH (ref 8–23)
CO2: 25 mmol/L (ref 22–32)
Calcium: 10.2 mg/dL (ref 8.9–10.3)
Chloride: 99 mmol/L (ref 98–111)
Creatinine, Ser: 1.5 mg/dL — ABNORMAL HIGH (ref 0.44–1.00)
GFR, Estimated: 36 mL/min — ABNORMAL LOW (ref >60)
Glucose, Bld: 303 mg/dL — ABNORMAL HIGH (ref 70–99)
Potassium: 5.1 mmol/L (ref 3.5–5.1)
Sodium: 136 mmol/L (ref 135–145)

## 2021-06-08 LAB — RESP PANEL BY RT-PCR (FLU A&B, COVID) ARPGX2
Influenza A by PCR: NEGATIVE
Influenza B by PCR: NEGATIVE
SARS Coronavirus 2 by RT PCR: NEGATIVE

## 2021-06-08 LAB — CBC
HCT: 45.6 % (ref 36.0–46.0)
Hemoglobin: 14 g/dL (ref 12.0–15.0)
MCH: 26.8 pg (ref 26.0–34.0)
MCHC: 30.7 g/dL (ref 30.0–36.0)
MCV: 87.4 fL (ref 80.0–100.0)
Platelets: 304 10*3/uL (ref 150–400)
RBC: 5.22 MIL/uL — ABNORMAL HIGH (ref 3.87–5.11)
RDW: 15.1 % (ref 11.5–15.5)
WBC: 11 10*3/uL — ABNORMAL HIGH (ref 4.0–10.5)
nRBC: 0 % (ref 0.0–0.2)

## 2021-06-08 LAB — HEPATIC FUNCTION PANEL
ALT: 18 U/L (ref 0–44)
AST: 24 U/L (ref 15–41)
Albumin: 3.9 g/dL (ref 3.5–5.0)
Alkaline Phosphatase: 91 U/L (ref 38–126)
Bilirubin, Direct: 0.1 mg/dL (ref 0.0–0.2)
Indirect Bilirubin: 0.8 mg/dL (ref 0.3–0.9)
Total Bilirubin: 0.9 mg/dL (ref 0.3–1.2)
Total Protein: 8.2 g/dL — ABNORMAL HIGH (ref 6.5–8.1)

## 2021-06-08 LAB — TROPONIN I (HIGH SENSITIVITY)
Troponin I (High Sensitivity): 10 ng/L (ref <18)
Troponin I (High Sensitivity): 14 ng/L (ref <18)

## 2021-06-08 LAB — LIPASE, BLOOD: Lipase: 62 U/L — ABNORMAL HIGH (ref 11–51)

## 2021-06-08 MED ORDER — HYDROCORTISONE SOD SUC (PF) 250 MG IJ SOLR
200.0000 mg | Freq: Once | INTRAMUSCULAR | Status: DC
  Filled 2021-06-08: qty 200

## 2021-06-08 MED ORDER — METHYLPREDNISOLONE SODIUM SUCC 125 MG IJ SOLR
125.0000 mg | Freq: Once | INTRAMUSCULAR | Status: DC
  Filled 2021-06-08: qty 2

## 2021-06-08 MED ORDER — DIPHENHYDRAMINE HCL 50 MG/ML IJ SOLN: 50.0000 mg | Freq: Once | INTRAMUSCULAR | Status: DC

## 2021-06-08 MED ORDER — ONDANSETRON HCL 4 MG/2ML IJ SOLN
4.0000 mg | Freq: Once | INTRAMUSCULAR | Status: AC
  Administered 2021-06-08: 4 mg via INTRAVENOUS
  Filled 2021-06-08: qty 2

## 2021-06-08 MED ORDER — PANTOPRAZOLE SODIUM 40 MG IV SOLR
40.0000 mg | Freq: Once | INTRAVENOUS | Status: AC
  Administered 2021-06-08: 40 mg via INTRAVENOUS
  Filled 2021-06-08 (×2): qty 10

## 2021-06-08 MED ORDER — IPRATROPIUM-ALBUTEROL 0.5-2.5 (3) MG/3ML IN SOLN
9.0000 mL | Freq: Once | RESPIRATORY_TRACT | Status: AC
  Administered 2021-06-08: 9 mL via RESPIRATORY_TRACT
  Filled 2021-06-08: qty 9

## 2021-06-08 MED ORDER — NITROGLYCERIN 0.4 MG SL SUBL
0.4000 mg | SUBLINGUAL_TABLET | SUBLINGUAL | Status: DC | PRN
  Administered 2021-06-08: 0.4 mg via SUBLINGUAL
  Filled 2021-06-08: qty 1

## 2021-06-08 MED ORDER — LACTATED RINGERS IV BOLUS
1000.0000 mL | Freq: Once | INTRAVENOUS | Status: AC
  Administered 2021-06-08: 1000 mL via INTRAVENOUS

## 2021-06-08 MED ORDER — ALBUTEROL SULFATE (2.5 MG/3ML) 0.083% IN NEBU
2.5000 mg | INHALATION_SOLUTION | Freq: Once | RESPIRATORY_TRACT | Status: AC
  Administered 2021-06-08: 2.5 mg via RESPIRATORY_TRACT
  Filled 2021-06-08: qty 3

## 2021-06-08 MED ORDER — ASPIRIN 81 MG PO CHEW
324.0000 mg | CHEWABLE_TABLET | Freq: Once | ORAL | Status: AC
  Administered 2021-06-08: 324 mg via ORAL
  Filled 2021-06-08: qty 4

## 2021-06-08 MED ORDER — SODIUM CHLORIDE 0.9 % IV SOLN
1.0000 g | INTRAVENOUS | Status: AC
  Administered 2021-06-08: 1 g via INTRAVENOUS
  Filled 2021-06-08: qty 10

## 2021-06-08 MED ORDER — INSULIN ASPART 100 UNIT/ML IJ SOLN
0.0000 [IU] | INTRAMUSCULAR | Status: DC
  Administered 2021-06-08 (×2): 5 [IU] via SUBCUTANEOUS
  Administered 2021-06-09: 11 [IU] via SUBCUTANEOUS
  Administered 2021-06-09 (×2): 15 [IU] via SUBCUTANEOUS
  Filled 2021-06-08 (×5): qty 1

## 2021-06-08 MED ORDER — METHYLPREDNISOLONE SODIUM SUCC 125 MG IJ SOLR
125.0000 mg | Freq: Once | INTRAMUSCULAR | Status: AC
Start: 2021-06-08 — End: 2021-06-08
  Administered 2021-06-08: 125 mg via INTRAVENOUS

## 2021-06-08 MED ORDER — DIPHENHYDRAMINE HCL 25 MG PO CAPS: 50.0000 mg | ORAL_CAPSULE | Freq: Once | ORAL | Status: DC

## 2021-06-08 NOTE — ED Triage Notes (Signed)
Pt comes pov with chest pain starting around lunch time. Pain is central and does not radiate. X1 episode of emesis. Was given nitro paste with EMS. EMS unable to start a line. Pt takes eliquis.  ?

## 2021-06-08 NOTE — ED Notes (Signed)
Unable to place IV X2 attempts.  ?

## 2021-06-08 NOTE — ED Provider Notes (Signed)
----------------------------------------- ?  7:48 PM on 06/08/2021 ?----------------------------------------- ? ?Blood pressure (!) 153/114, pulse (!) 119, temperature 97.8 ?F (36.6 ?C), temperature source Oral, resp. rate (!) 24, height 5\' 6"  (1.676 m), weight 96.6 kg, SpO2 95 %. ? ?Assuming care from Dr. Creig Hines.  In short, Tammy [name] is a 76 y.o. female with a chief complaint of Chest Pain ?[name]  Refer to the original H&P for additional details. ? ?The current plan of care is to await results of urinalysis and consider admission for unstable angina. ? ?----------------------------------------- ?9:49 PM on 06/08/2021 ?----------------------------------------- ?Patient still has not urinated.  IV finally established and fluids previously ordered are infusing.  Nursing staff have advised that the patient's heart rate has increased from the 90s and is now in the 120s.  Oxygen saturation decreased to 88% while at rest on room air.  Patient is not currently on oxygen at home. ? ?Upon reassessment, patient states that she continues to have some chest pain and describes it as sharp, midsternal.  Nitroglycerin sublingual tablet ordered.  With 2 L of oxygen she is now maintaining 95%, however if she holds a conversation oxygen saturation will decrease to about 90%.  She is not feeling significantly any more short of breath than previously.  She states that in general she just feels bad and does not believe she has ever felt quite like this before.  Plan will be to admit her at least for observation for unstable angina and new oxygen requirement. ? ? ?----------------------------------------- ?10:15 PM on 06/08/2021 ?----------------------------------------- ?Patient denies chest pain after sublingual NTG. Remains on 2 liters of oxygen. ? ?----------------------------------------- ?10:45 PM on 06/08/2021 ?----------------------------------------- ?Consulted with hospitalist who requests that CTA for PE be completed prior to  accepting for admission.  ? ?IV fluids infusing, urinalysis pending. ? ?Patient allergic to contrast. Contrast allergy protocol to be started. ? ?----------------------------------------- ?11:34 PM on 06/08/2021 ?----------------------------------------- ?Patient moved to room 9. Patient was asked about her previous reaction to IV contrast. No anaphylaxis to contrast but due to her history of chronic kidney disease her doctors have told her not to have studies with contrast.  Today, BUN is 25 with a creatinine of 1.5 which leaves an estimated GFR of 36. One liter of fluids has infused. She was advised of the importance of the study to rule out PE since her sats decreased into the 80's and she has had some chest pain. She is willing to have the study. CT tech advised. Care transitioned to Dr. Karma Greaser who will assume care from here.  ?  ?Victorino Dike, FNP ?06/08/21 2342 ? ?  ?Lucrezia Starch, MD ?06/09/21 1023 ? ?

## 2021-06-08 NOTE — ED Provider Notes (Signed)
----------------------------------------- ?  11:33 PM on 06/08/2021 ?----------------------------------------- ? ?Assuming care from [facility].  In short, Tammy [name] is a 76 y.o. female with a chief complaint of shortness of breath and chest pain.  Refer to the original H&P for additional details. ? ?The current plan of care is to follow up CTA chest and admit. ? ? ?----------------------------------------- ?12:36 AM on 06/09/2021 ?----------------------------------------- ? ?Patient is very concerned about getting a CTA chest given her history of chronic kidney disease.  She has been told by her doctor to not get IV contrast given the concern for contrast-induced nephropathy. ? ?Although her GFR is above the strict cutoff, I am not sure that is absolutely necessary or indicated at this time given that the patient will come into the hospital regardless for her acute respiratory failure with hypoxemia (she was 88% on room air and is satting in the 90s on 2 L of oxygen by nasal cannula), as well as the question of unstable angina.  She is also anticoagulated currently on Eliquis regardless. ? ?I consulted Dr. Damita Dunnings with the hospitalist service to discuss the plan.  She agrees with admission and will admit the patient on heparin and likely obtain a VQ scan in the morning..  I updated the patient and her husband and they understand and agree with the plan and are pleased to not proceed with the CTA. ? ?I also reviewed the patient's urinalysis.  It is positive for UTI and I ordered a urine culture and ceftriaxone 1 g IV. ? ? ?Final diagnoses:  ?Chest pain, unspecified type  ?Nausea and vomiting, unspecified vomiting type  ?SOB (shortness of breath)  ?Hyperglycemia  ?Anticoagulated  ?Chronic kidney disease, unspecified CKD stage  ?Exacerbation of asthma, unspecified asthma severity, unspecified whether persistent  ?Urinary tract infection without hematuria, site unspecified  ?Acute respiratory failure with  hypoxemia (HCC)  ? ? ?  ?Hinda Kehr, MD ?06/09/21 0038 ? ?

## 2021-06-08 NOTE — ED Provider Notes (Addendum)
? ?[facility] ?Provider Note ? ? ? Event Date/Time  ? First MD Initiated Contact with Patient 06/08/21 1852   ?  (approximate) ? ? ?History  ? ?Chest Pain ? ? ?HPI ? ?Tammy [name] is a 76 y.o. female with a past medical history of CAD, DM, HTN, HDL, hypothyroidism COPD, CKD, asthma, breast cancer, PE on Eliquis, PVD, and TIAs without residual deficits, diverticulosis, breast cancer having completed chemotherapy on anastrozole and chronic right lower quadrant pain going back several months 9 different today who presents for evaluation of some chest pain associate with nausea and vomiting starting earlier today after lunch.  Patient states she was recently treated for UTI but is worried she also has a urinary tract infection because her urine is very dark and that is usually the only symptom she has.  She denies any diarrhea, constipation, acute abdominal pain, back pain, cough or fever but does feel little short of breath.  No headache, earache, sore throat or other clear sick symptoms.  No injuries or falls.  She states she is compliant with all her medications.  Patient was given some Nitropaste with EMS which she states helped a little bit. ? ?  ?Past Medical History:  ?Diagnosis Date  ? Asthma   ? Benign neoplasm of colon   ? Breast cancer ([place]) 08/2018  ? left breast  ? Cervical disc disease   ? "bulging" - no limitations per pt  ? Chronic diastolic CHF (congestive heart failure) ([place])   ? a. 07/2012 Echo: Nl EF. mild inferoseptal HK, Gr 2 DD, mild conc LVH, mild PR/TR, mild PAH; b. 08/2019 Echo: EF 55-60%, no rwma, Gr1 DD. Nl RV size/fxn.  ? CKD (chronic kidney disease), stage III ([place])   ? COPD (chronic obstructive pulmonary disease) ([place])   ? Coronary artery disease   ? a. 11/2011 Cath: LAD 34m/d, LCX min irregs, RCA 70p, 127m with L->R collats, EF 60%-->Med Rx; b. 03/2019 Cath: LM nl, LAD 40-24m. LCX large, mild diff dzs. OM1/2 nl. RCA known to be 175m. L-->R collats from LAD & LCX/OM.  ?  Diabetes mellitus without complication ([place])   ? Fusion of lumbar spine 12/22/2015  ? GERD (gastroesophageal reflux disease)   ? History of DVT (deep vein thrombosis)   ? History of tobacco abuse   ? a. Quit 2011.  ? Hyperlipidemia   ? Hypertension   ? Hypothyroidism   ? Lung cancer ([place])   ? Obesity   ? Palpitations   ? Personal history of radiation therapy   ? PONV (postoperative nausea and vomiting)   ? Pulmonary embolus ([place]) 12/25/2013  ? RLL. Presented with SOB and elevated d-dimer.   ? PVD (peripheral vascular disease) ([place])   ? a. PTA of right leg and left femoral artery with stenosis; b. 09/2020 ABI: R 0.93, L 0.88 - unchanged from prior study.  ? Stroke [facility]) 2015  ? TIAs - no deficits  ? ? ? ?Physical Exam  ?Triage Vital Signs: ?ED Triage Vitals  ?Enc Vitals Group  ?   BP 06/08/21 1627 (!) 154/83  ?   Pulse Rate 06/08/21 1627 91  ?   Resp 06/08/21 1627 18  ?   Temp 06/08/21 1627 97.8 ?F (36.6 ?C)  ?   Temp Source 06/08/21 1627 Oral  ?   SpO2 06/08/21 1627 97 %  ?   Weight 06/08/21 1626 213 lb (96.6 kg)  ?   Height 06/08/21 1626 5\' 6"  (1.676  m)  ?   Head Circumference --   ?   Peak Flow --   ?   Pain Score 06/08/21 1626 3  ?   Pain Loc --   ?   Pain Edu? --   ?   Excl. in [place]? --   ? ? ?Most recent vital signs: ?Vitals:  ? 06/08/21 2115 06/08/21 2119  ?BP: (!) 153/114   ?Pulse: (!) 119   ?Resp: (!) 24   ?Temp:    ?SpO2: (!) 88% 95%  ? ? ?General: Awake, does seem very uncomfortable. ?CV:  Good peripheral perfusion.  2+ radial pulses. ?Resp:  Normal effort.  Bilateral slight and expiratory wheezes.  No rhonchi or rales.  No significant increased effort. ?Abd:  No distention.  Mild tenderness in the right lower quadrant patient states is baseline without any rigidity or guarding and no significant CVA tenderness. ?Other:  ? ? ?ED Results / Procedures / Treatments  ?Labs ?(all labs ordered are listed, but only abnormal results are displayed) ?Labs Reviewed  ?BASIC METABOLIC PANEL - Abnormal; Notable for the  following components:  ?    Result Value  ? Glucose, Bld 303 (*)   ? BUN 25 (*)   ? Creatinine, Ser 1.50 (*)   ? GFR, Estimated 36 (*)   ? All other components within normal limits  ?CBC - Abnormal; Notable for the following components:  ? WBC 11.0 (*)   ? RBC 5.22 (*)   ? All other components within normal limits  ?HEPATIC FUNCTION PANEL - Abnormal; Notable for the following components:  ? Total Protein 8.2 (*)   ? All other components within normal limits  ?LIPASE, BLOOD - Abnormal; Notable for the following components:  ? Lipase 62 (*)   ? All other components within normal limits  ?CBG MONITORING, ED - Abnormal; Notable for the following components:  ? Glucose-Capillary 236 (*)   ? All other components within normal limits  ?RESP PANEL BY RT-PCR (FLU A&B, COVID) ARPGX2  ?URINALYSIS, COMPLETE (UACMP) WITH MICROSCOPIC  ?HEMOGLOBIN A1C  ?TROPONIN I (HIGH SENSITIVITY)  ?TROPONIN I (HIGH SENSITIVITY)  ? ? ? ?EKG ? ?ECG is remarkable for sinus rhythm with left axis deviation, unremarkable intervals with some artifact versus nonspecific changes in inferior lateral leads lead I and aVL without clear evidence of acute ischemia or significant arrhythmia. ? ?RADIOLOGY ?Chest reviewed by myself shows no focal consoidation, effusion, edema, pneumothorax or other clear acute thoracic process. I also reviewed radiology interpretation and agree with findings described within patient of possible atelectasis in the left upper lobe. ? ? ? ?PROCEDURES: ? ?Critical Care performed: No ? ?.1-3 Lead EKG Interpretation ?Performed by: Lucrezia Starch, MD ?Authorized by: Lucrezia Starch, MD  ? ?  Interpretation: non-specific   ?  ECG rate assessment: tachycardic   ?  Rhythm: sinus rhythm   ?  Ectopy: none   ?  Conduction: normal   ? ?The patient is on the cardiac monitor to evaluate for evidence of arrhythmia and/or significant heart rate changes. ? ? ?MEDICATIONS ORDERED IN ED: ?Medications  ?nitroGLYCERIN (NITROSTAT) SL tablet 0.4 mg  (0.4 mg Sublingual Given 06/08/21 2202)  ?insulin aspart (novoLOG) injection 0-15 Units (5 Units Subcutaneous Given 06/08/21 1943)  ?methylPREDNISolone sodium succinate (SOLU-MEDROL) 125 mg/2 mL injection 125 mg (has no administration in time range)  ?albuterol (PROVENTIL) (2.5 MG/3ML) 0.083% nebulizer solution 2.5 mg (has no administration in time range)  ?ondansetron (ZOFRAN) injection 4 mg (4 mg Intravenous  Given 06/08/21 2108)  ?ipratropium-albuterol (DUONEB) 0.5-2.5 (3) MG/3ML nebulizer solution 9 mL (9 mLs Nebulization Given 06/08/21 1944)  ?pantoprazole (PROTONIX) injection 40 mg (40 mg Intravenous Given 06/08/21 2109)  ?lactated ringers bolus 1,000 mL (1,000 mLs Intravenous New Bag/Given 06/08/21 2109)  ?aspirin chewable tablet 324 mg (324 mg Oral Given 06/08/21 2032)  ? ? ? ?IMPRESSION / MDM / ASSESSMENT AND PLAN / ED COURSE  ?I reviewed the triage vital signs and the nursing notes. ?             ?               ? ?Differential diagnosis includes, but is not limited to ACS, gastritis, arrhythmia, anemia, metabolic derangements, pancreatitis, cystitis with a lower suspicion at this time for PE given patient reports compliant with her Eliquis. ? ?ECG is remarkable for sinus rhythm with left axis deviation, unremarkable intervals with some artifact versus nonspecific changes in inferior lateral leads lead I and aVL without clear evidence of acute ischemia or significant arrhythmia.  Troponins x2 are negative and not suggestive of NSTEMI on the patient still has some mild discomfort on my assessment.  We will give ASA for possible unstable angina and sublingual nitroglycerin.  Will defer heparin as patient states she is compliant with her Eliquis. ? ?Chest reviewed by myself shows no focal consoidation, effusion, edema, pneumothorax or other clear acute thoracic process. I also reviewed radiology interpretation and agree with findings described within patient of possible atelectasis in the left upper lobe. ? ?BMP is  remarkable for glucose of 303 with stable kidney function and no other significant electrolyte or metabolic derangements.  Hepatic function panel has no evidence of hepatitis or cholestatic process.  CBC s

## 2021-06-08 NOTE — ED Notes (Signed)
This EMT assisted pt to restroom. Pt SOB upon exiting restroom. Wheelchair provided. O2 sat @ 90% RA when returning to bedside. Pt returned safely to bed. O2 sat 99% 2 L [place]. ?

## 2021-06-09 ENCOUNTER — Observation Stay: Payer: Medicare Other

## 2021-06-09 DIAGNOSIS — C50312 Malignant neoplasm of lower-inner quadrant of left female breast: Secondary | ICD-10-CM

## 2021-06-09 DIAGNOSIS — M7122 Synovial cyst of popliteal space [Baker], left knee: Secondary | ICD-10-CM | POA: Diagnosis not present

## 2021-06-09 DIAGNOSIS — M7989 Other specified soft tissue disorders: Secondary | ICD-10-CM | POA: Diagnosis not present

## 2021-06-09 DIAGNOSIS — N189 Chronic kidney disease, unspecified: Secondary | ICD-10-CM

## 2021-06-09 DIAGNOSIS — E1165 Type 2 diabetes mellitus with hyperglycemia: Secondary | ICD-10-CM | POA: Diagnosis not present

## 2021-06-09 DIAGNOSIS — Z170 Estrogen receptor positive status [ER+]: Secondary | ICD-10-CM

## 2021-06-09 DIAGNOSIS — R0902 Hypoxemia: Secondary | ICD-10-CM | POA: Diagnosis not present

## 2021-06-09 DIAGNOSIS — J45901 Unspecified asthma with (acute) exacerbation: Secondary | ICD-10-CM | POA: Diagnosis not present

## 2021-06-09 DIAGNOSIS — R072 Precordial pain: Secondary | ICD-10-CM

## 2021-06-09 DIAGNOSIS — Z7901 Long term (current) use of anticoagulants: Secondary | ICD-10-CM

## 2021-06-09 DIAGNOSIS — Z794 Long term (current) use of insulin: Secondary | ICD-10-CM | POA: Diagnosis not present

## 2021-06-09 DIAGNOSIS — I25110 Atherosclerotic heart disease of native coronary artery with unstable angina pectoris: Secondary | ICD-10-CM

## 2021-06-09 DIAGNOSIS — J9601 Acute respiratory failure with hypoxia: Secondary | ICD-10-CM | POA: Diagnosis not present

## 2021-06-09 DIAGNOSIS — R748 Abnormal levels of other serum enzymes: Secondary | ICD-10-CM

## 2021-06-09 LAB — HEMOGLOBIN A1C
Hgb A1c MFr Bld: 9.6 % — ABNORMAL HIGH (ref 4.8–5.6)
Mean Plasma Glucose: 228.82 mg/dL

## 2021-06-09 LAB — CBG MONITORING, ED
Glucose-Capillary: 365 mg/dL — ABNORMAL HIGH (ref 70–99)
Glucose-Capillary: 347 mg/dL — ABNORMAL HIGH (ref 70–99)
Glucose-Capillary: 354 mg/dL — ABNORMAL HIGH (ref 70–99)

## 2021-06-09 LAB — D-DIMER, QUANTITATIVE: D-Dimer, Quant: 0.4 ug/mL-FEU (ref 0.00–0.50)

## 2021-06-09 MED ORDER — INSULIN GLARGINE-YFGN 100 UNIT/ML ~~LOC~~ SOLN: 30.0000 [IU] | Freq: Every day | SUBCUTANEOUS | Status: DC

## 2021-06-09 MED ORDER — DULOXETINE HCL 60 MG PO CPEP
120.0000 mg | ORAL_CAPSULE | Freq: Every day | ORAL | Status: DC
  Administered 2021-06-09: 120 mg via ORAL
  Filled 2021-06-09: qty 2

## 2021-06-09 MED ORDER — INSULIN ASPART 100 UNIT/ML IJ SOLN: 0.0000 [IU] | Freq: Three times a day (TID) | INTRAMUSCULAR | Status: DC

## 2021-06-09 MED ORDER — ONDANSETRON HCL 4 MG PO TABS: 4.0000 mg | ORAL_TABLET | Freq: Four times a day (QID) | ORAL | Status: DC | PRN

## 2021-06-09 MED ORDER — BISOPROLOL FUMARATE 5 MG PO TABS
10.0000 mg | ORAL_TABLET | Freq: Two times a day (BID) | ORAL | Status: DC
  Administered 2021-06-09: 10 mg via ORAL
  Filled 2021-06-09 (×2): qty 2

## 2021-06-09 MED ORDER — ROSUVASTATIN CALCIUM 20 MG PO TABS
40.0000 mg | ORAL_TABLET | Freq: Every day | ORAL | Status: DC
  Administered 2021-06-09: 40 mg via ORAL
  Filled 2021-06-09: qty 2

## 2021-06-09 MED ORDER — INSULIN ASPART 100 UNIT/ML IJ SOLN: 0.0000 [IU] | Freq: Every day | INTRAMUSCULAR | Status: DC

## 2021-06-09 MED ORDER — LEVOTHYROXINE SODIUM 50 MCG PO TABS
150.0000 ug | ORAL_TABLET | Freq: Every day | ORAL | Status: DC
  Administered 2021-06-09: 150 ug via ORAL
  Filled 2021-06-09: qty 1

## 2021-06-09 MED ORDER — APIXABAN 5 MG PO TABS
5.0000 mg | ORAL_TABLET | Freq: Two times a day (BID) | ORAL | Status: DC
  Administered 2021-06-09 (×2): 5 mg via ORAL
  Filled 2021-06-09 (×2): qty 1

## 2021-06-09 MED ORDER — INSULIN GLARGINE 100 UNIT/ML ~~LOC~~ SOLN
30.0000 [IU] | Freq: Once | SUBCUTANEOUS | Status: AC
  Administered 2021-06-09: 30 [IU] via SUBCUTANEOUS
  Filled 2021-06-09: qty 0.3

## 2021-06-09 MED ORDER — INSULIN GLARGINE 100 UNIT/ML ~~LOC~~ SOLN
30.0000 [IU] | Freq: Every day | SUBCUTANEOUS | Status: DC
  Filled 2021-06-09: qty 0.3

## 2021-06-09 MED ORDER — PANTOPRAZOLE SODIUM 40 MG PO TBEC
40.0000 mg | DELAYED_RELEASE_TABLET | Freq: Every day | ORAL | Status: DC
  Administered 2021-06-09: 40 mg via ORAL
  Filled 2021-06-09: qty 1

## 2021-06-09 MED ORDER — ACETAMINOPHEN 325 MG PO TABS: 650.0000 mg | ORAL_TABLET | Freq: Four times a day (QID) | ORAL | Status: DC | PRN

## 2021-06-09 MED ORDER — ANASTROZOLE 1 MG PO TABS
1.0000 mg | ORAL_TABLET | Freq: Every day | ORAL | Status: DC
  Administered 2021-06-09: 1 mg via ORAL
  Filled 2021-06-09: qty 1

## 2021-06-09 MED ORDER — SODIUM CHLORIDE 0.9 % IV SOLN: INTRAVENOUS | Status: AC

## 2021-06-09 MED ORDER — ONDANSETRON HCL 4 MG/2ML IJ SOLN: 4.0000 mg | Freq: Four times a day (QID) | INTRAMUSCULAR | Status: DC | PRN

## 2021-06-09 MED ORDER — ACETAMINOPHEN 650 MG RE SUPP: 650.0000 mg | Freq: Four times a day (QID) | RECTAL | Status: DC | PRN

## 2021-06-09 MED ORDER — NITROGLYCERIN 0.4 MG SL SUBL: 0.4000 mg | SUBLINGUAL_TABLET | SUBLINGUAL | Status: DC | PRN

## 2021-06-09 MED ORDER — ISOSORBIDE MONONITRATE ER 60 MG PO TB24
60.0000 mg | ORAL_TABLET | Freq: Every day | ORAL | Status: DC
  Administered 2021-06-09: 60 mg via ORAL
  Filled 2021-06-09: qty 1

## 2021-06-09 MED ORDER — APIXABAN 2.5 MG PO TABS: 2.5000 mg | ORAL_TABLET | Freq: Two times a day (BID) | ORAL | Status: DC

## 2021-06-09 MED ORDER — BISOPROLOL FUMARATE 10 MG PO TABS: 10.0000 mg | ORAL_TABLET | Freq: Two times a day (BID) | ORAL | 2 refills | Status: DC

## 2021-06-09 MED ORDER — MIRABEGRON ER 50 MG PO TB24
50.0000 mg | ORAL_TABLET | Freq: Every day | ORAL | Status: DC
  Administered 2021-06-09: 50 mg via ORAL
  Filled 2021-06-09: qty 1

## 2021-06-09 NOTE — Consult Note (Signed)
?Cardiology Consultation:  ? ?Patient ID: Tammy [name] ?MRN: [id number]; DOB: [birth year] ? ?Admit date: 06/08/2021 ?Date of Consult: 06/09/2021 ? ?PCP:  Tammy Roys, DO ?  ?[facility] ?Cardiologist:  Tammy Rogue, MD   { ? ?Patient Profile:  ? ?Tammy [name] is a 76 y.o. female with a hx of CAD with CTA of RCA, PAD, HFpEF, remote tobacco abuse, COPD, HTN, HLD, CKD stage 3, diabetes, TIA, DVT/PE on chronic Eliquis, lung cancer, and breast cancer who is being seen 06/09/2021 for the evaluation of chest pain at the request of Dr. Billie Ruddy. ? ?History of Present Illness:  ? ?Tammy [name] previously underwent diagnostic catheterization in 2012 and again in September 2013, which revealed total occlusion of the mRCA with L>R collaterals. In the setting of dyspnea and chest pain, she underwent repeat catheterization in January 2021 which showed stable anatomy, and she was medically managed. Echo in June 2021 showed an EF of 55-60% with G1DD. Most recent ABIs in July 2022 were stable.  ? ?Last seen 10/2020 and was overall doing well. She was recently diagnosed with lung cancer and underwent radiation. She was requiring NTG SL three times a year. Plan was to bring her back in a month, but she was lost to follow-up.  ? ?The patient presented to the ER 06/08/21 for chest pain. It occurred yesterday am and had associated nausea and vomiting. She took2 SL NTG. She then went to kidney doctors who called EMS. Pulse was 106bpm in the office. ? ?In the ER BP 153/83, pulse 91, RR 18, afebrile. Labs showed BG 303, Scr 1.5, BUN 25, WBC 11.0, Hgb 14, lipase 62. HS trop 10>14. EKG showed NSR and nonspecific ST changes. CXR with no acute process. She was give SL NTG and Aspirin and admitted for further work-up.  ? ? ?Past Medical History:  ?Diagnosis Date  ? Asthma   ? Benign neoplasm of colon   ? Breast cancer ([place]) 08/2018  ? left breast  ? Cervical disc disease   ? "bulging" - no limitations per pt  ? Chronic diastolic CHF (congestive  heart failure) ([place])   ? a. 07/2012 Echo: Nl EF. mild inferoseptal HK, Gr 2 DD, mild conc LVH, mild PR/TR, mild PAH; b. 08/2019 Echo: EF 55-60%, no rwma, Gr1 DD. Nl RV size/fxn.  ? CKD (chronic kidney disease), stage III ([place])   ? COPD (chronic obstructive pulmonary disease) ([place])   ? Coronary artery disease   ? a. 11/2011 Cath: LAD 96md, LCX min irregs, RCA 70p, 1067mith L->R collats, EF 60%-->Med Rx; b. 03/2019 Cath: LM nl, LAD 40-5071mCX large, mild diff dzs. OM1/2 nl. RCA known to be 100m95m->R collats from LAD & LCX/OM.  ? Diabetes mellitus without complication (HCC)[place]? Fusion of lumbar spine 12/22/2015  ? GERD (gastroesophageal reflux disease)   ? History of DVT (deep vein thrombosis)   ? History of tobacco abuse   ? a. Quit 2011.  ? Hyperlipidemia   ? Hypertension   ? Hypothyroidism   ? Lung cancer (H[facility])[place]? Obesity   ? Palpitations   ? Personal history of radiation therapy   ? PONV (postoperative nausea and vomiting)   ? Pulmonary embolus (H[facility])[place]/15/2015  ? RLL. Presented with SOB and elevated d-dimer.   ? PVD (peripheral vascular disease) (H[facility])[place]? a. PTA of right leg and left femoral artery with stenosis; b. 09/2020 ABI: R 0.93, L 0.88 - unchanged from  prior study.  ? Stroke [facility]) 2015  ? TIAs - no deficits  ? ? ?Past Surgical History:  ?Procedure Laterality Date  ? ANGIOPLASTY / STENTING FEMORAL    ? PVD; angioplasty right leg and left femoral artery with stenosis.   ? APPENDECTOMY  12/2014  ? BACK SURGERY  03/2012  ? nerve stimulator inserted  ? BACK SURGERY    ? screws, rods replaced with new hardware.  ? BACK SURGERY  11/2016  ? BREAST LUMPECTOMY Left 08/26/2018  ? BREAST SURGERY    ? CARDIAC CATHETERIZATION  12/07/2011  ? Mid LAD 50%, distal LAD 50%, mid RCA 70%, distal RCA 100%   ? CARDIAC CATHETERIZATION  01/04/2011  ? 100% occluded mid RCA with good collaterals from distal LAD, normal LVEF.  ? CHOLECYSTECTOMY    ? COLONOSCOPY  2013  ? COLONOSCOPY WITH PROPOFOL N/A 05/24/2015  ? Procedure:  COLONOSCOPY WITH PROPOFOL;  Surgeon: [name], MD;  Location: [facility];  Service: Endoscopy;  Laterality: N/A;  Diabetic - oral meds  ? COLONOSCOPY WITH PROPOFOL N/A 01/28/2019  ? Procedure: COLONOSCOPY WITH PROPOFOL;  Surgeon: [name], MD;  Location: [facility];  Service: Endoscopy;  Laterality: N/A;  ? DIAGNOSTIC LAPAROSCOPY    ? ESOPHAGOGASTRODUODENOSCOPY (EGD) WITH PROPOFOL N/A 01/28/2019  ? Procedure: ESOPHAGOGASTRODUODENOSCOPY (EGD) WITH PROPOFOL;  Surgeon: [name], MD;  Location: [facility];  Service: Endoscopy;  Laterality: N/A;  ? LAPAROSCOPIC APPENDECTOMY N/A 01/06/2015  ? Procedure: APPENDECTOMY LAPAROSCOPIC;  Surgeon: [name], MD;  Location: [facility];  Service: General;  Laterality: N/A;  ? LEFT HEART CATH AND CORONARY ANGIOGRAPHY Left 04/04/2019  ? Procedure: LEFT HEART CATH AND CORONARY ANGIOGRAPHY;  Surgeon: [name], MD;  Location: [facility];  Service: Cardiovascular;  Laterality: Left;  ? MASTECTOMY W/ SENTINEL NODE BIOPSY Left 08/26/2018  ? Procedure: PARTIAL MASTECTOMY WIDE EXCISION WITH SENTINEL LYMPH NODE BIOPSY LEFT;  Surgeon: [name], MD;  Location: [facility];  Service: General;  Laterality: Left;  ? POLYPECTOMY  05/24/2015  ? Procedure: POLYPECTOMY;  Surgeon: [name], MD;  Location: [facility];  Service: Endoscopy;;  ? REPLACEMENT TOTAL KNEE Right   ? THYROIDECTOMY    ? TOTAL KNEE ARTHROPLASTY Right 11/13/2019  ? TRIGGER FINGER RELEASE    ?  ? ?Home Medications:  ?Prior to Admission medications   ?Medication Sig Start Date End Date Taking? Authorizing Provider  ?albuterol (VENTOLIN HFA) 108 (90 Base) MCG/ACT inhaler Inhale 2 puffs into the lungs every 6 (six) hours as needed for wheezing or shortness of breath. 01/13/21  Yes Johnson, Megan P, DO  ?anastrozole (ARIMIDEX) 1 MG tablet Take 1 tablet (1 mg total) by mouth daily. 10/18/20  Yes Cammie Sickle, MD  ?apixaban (ELIQUIS) 2.5 MG  TABS tablet Take 1 tablet (2.5 mg total) by mouth 2 (two) times daily. 02/15/21  Yes Johnson, Megan P, DO  ?blood glucose meter kit and supplies KIT Dispense based on patient and insurance preference. Use one time daily as directed, E11.9 04/23/15  Yes Lada, Satira Anis, MD  ?Calcium Carb-Cholecalciferol (CALCIUM 600 + D) 600-200 MG-UNIT TABS Take 1 tablet by mouth daily.    Yes [provider]  ?cephALEXin (KEFLEX) 250 MG capsule Take 250 mg by mouth daily.   Yes [provider]  ?cetirizine (ZYRTEC) 10 MG tablet TAKE 1 TABLET BY MOUTH DAILY 05/04/20  Yes Johnson, Megan P, DO  ?cholecalciferol (VITAMIN D3) 25 MCG (1000 UT) tablet Take 1,000 Units  by mouth daily.   Yes [provider]  ?CRANBERRY PO Take 2 capsules by mouth daily.   Yes [provider]  ?DULoxetine (CYMBALTA) 60 MG capsule Take 2 capsules (120 mg total) by mouth daily. 02/15/21  Yes Johnson, Megan P, DO  ?gabapentin (NEURONTIN) 300 MG capsule Take 1 capsule (300 mg total) by mouth 3 (three) times daily. 08/19/20  Yes Johnson, Megan P, DO  ?glipiZIDE (GLUCOTROL) 5 MG tablet TAKE 1 TABLET BY MOUTH DAILY BEFORE BREAKFAST 05/13/21  Yes Johnson, Megan P, DO  ?insulin degludec (TRESIBA FLEXTOUCH) 100 UNIT/ML FlexTouch Pen Inject 30 Units into the skin daily.   Yes [provider]  ?Insulin Pen Needle (PEN NEEDLES 3/16") 31G X 5 MM MISC 1 each by Does not apply route daily. 08/08/19  Yes Johnson, Megan P, DO  ?isosorbide mononitrate (IMDUR) 60 MG 24 hr tablet TAKE ONE TABLET BY MOUTH AT BEDTIME 02/15/21  Yes Johnson, Megan P, DO  ?Lactobacillus (PROBIOTIC ACIDOPHILUS PO) Take 1 capsule by mouth daily at 12 noon.   Yes [provider]  ?metoprolol succinate (TOPROL-XL) 50 MG 24 hr tablet TAKE 1 TABLET BY MOUTH DAILY WITH OR IMMEDIATLEY FOLLOWING A MEAL 02/15/21  Yes Johnson, Megan P, DO  ?montelukast (SINGULAIR) 10 MG tablet TAKE 1 TABLET BY MOUTH AT BEDTIME 02/02/21  Yes Johnson, Megan P, DO  ?MYRBETRIQ 50 MG TB24  tablet TAKE 1 TABLET BY MOUTH DAILY 01/31/21  Yes Vaillancourt, Samantha, PA-C  ?nitroGLYCERIN (NITROSTAT) 0.4 MG SL tablet Place 1 tablet (0.4 mg total) under the tongue every 5 (five) minutes as needed for c

## 2021-06-09 NOTE — ED Notes (Signed)
Notified dr. [name] and dr. Billie Ruddy that pt's O2 sats were 89-93% while on RA. Pt. To be discharged per Dr. [name] and dr. Billie Ruddy. ?

## 2021-06-09 NOTE — Assessment & Plan Note (Signed)
Patient with chest pain but with negative troponins x2 and nonacute EKG ?Continue nitroglycerin sublingual as needed chest pain with morphine for breakthrough ?Continue metoprolol, isosorbide and rosuvastatin ?Consider cardiology consult in the a.m. ?

## 2021-06-09 NOTE — Progress Notes (Signed)
Admission profile updated. ?

## 2021-06-09 NOTE — Assessment & Plan Note (Signed)
O2 sat 88% on room air ?Supplemental oxygen to keep sats over 92% ?

## 2021-06-09 NOTE — ED Notes (Signed)
Messaged dr. Billie Ruddy and dr. [name] about pt's discharge vs admission. Will take pt. Off O2 [place] and watch oxygen saturation level.  ?

## 2021-06-09 NOTE — Assessment & Plan Note (Signed)
Rocephin and follow cultures.  Patient has history of frequent UTIs ?

## 2021-06-09 NOTE — Assessment & Plan Note (Signed)
Continue apixaban and rosuvastatin ?

## 2021-06-09 NOTE — Assessment & Plan Note (Signed)
On Eliquis for history of PE.  Continue Eliquis ?

## 2021-06-09 NOTE — Assessment & Plan Note (Signed)
No acute issues.  Continue anastrozole ?

## 2021-06-09 NOTE — H&P (Signed)
?History and Physical  ? ? ?Patient: Tammy [name] DOB: 1945-10-25 ?DOA: 06/08/2021 ?DOS: the patient was seen and examined on 06/09/2021 ?PCP: [name], DO  ?Patient coming from: Home ? ?Chief Complaint:  ?Chief Complaint  ?Patient presents with  ? Chest Pain  ? ? ?HPI: Tammy [name] is a 76 y.o. female with medical history significant for Breast cancer on anastrozole, lung cancer, CKD 3B, HTN, PAD, history of PE on Eliquis, frequent UTIs, DM on insulin, CAD who presented to the ED with chest pain, associated with diaphoresis and an episode of vomiting.  She stated the symptoms felt like a UTI which she has had in the past.  She was brought in by EMS who administered nitroglycerin in route with resolution of the chest pain. ?ED course: Patient was tachycardic to the 1 teens in the ED and intermittently hypoxic to 88 requiring O2 at 2 L.  BP slightly elevated at 154/83.  Troponin was 12-14.  WBC 11,000.  Creatinine 1.5, glucose 303, lipase 62 with normal LFTs.  COVID and flu negative.  Urinalysis showed large leukocyte esterase and rare bacteria. ?Patient was started on Rocephin.   ?ED provider recommended CTA chest to evaluate for PE but patient was very hesitant in view of her renal function.  Hospitalist consulted for admission.  ? ?Review of Systems: As mentioned in the history of present illness. All other systems reviewed and are negative. ?Past Medical History:  ?Diagnosis Date  ? Asthma   ? Benign neoplasm of colon   ? Breast cancer ([place]) 08/2018  ? left breast  ? Cervical disc disease   ? "bulging" - no limitations per pt  ? Chronic diastolic CHF (congestive heart failure) ([place])   ? a. 07/2012 Echo: Nl EF. mild inferoseptal HK, Gr 2 DD, mild conc LVH, mild PR/TR, mild PAH; b. 08/2019 Echo: EF 55-60%, no rwma, Gr1 DD. Nl RV size/fxn.  ? CKD (chronic kidney disease), stage III ([place])   ? COPD (chronic obstructive pulmonary disease) ([place])   ? Coronary artery disease   ? a. 11/2011 Cath: LAD 34md,  LCX min irregs, RCA 70p, 1022mith L->R collats, EF 60%-->Med Rx; b. 03/2019 Cath: LM nl, LAD 40-5063mCX large, mild diff dzs. OM1/2 nl. RCA known to be 100m52m->R collats from LAD & LCX/OM.  ? Diabetes mellitus without complication (HCC)[place]? Fusion of lumbar spine 12/22/2015  ? GERD (gastroesophageal reflux disease)   ? History of DVT (deep vein thrombosis)   ? History of tobacco abuse   ? a. Quit 2011.  ? Hyperlipidemia   ? Hypertension   ? Hypothyroidism   ? Lung cancer (HCC)[place]? Obesity   ? Palpitations   ? Personal history of radiation therapy   ? PONV (postoperative nausea and vomiting)   ? Pulmonary embolus (HCC)[place]/15/2015  ? RLL. Presented with SOB and elevated d-dimer.   ? PVD (peripheral vascular disease) (HCC)[place]? a. PTA of right leg and left femoral artery with stenosis; b. 09/2020 ABI: R 0.93, L 0.88 - unchanged from prior study.  ? [facility]  ? TIAs - no deficits  ? ?Past Surgical History:  ?Procedure Laterality Date  ? ANGIOPLASTY / STENTING FEMORAL    ? PVD; angioplasty right leg and left femoral artery with stenosis.   ? APPENDECTOMY  12/2014  ? BACK SURGERY  03/2012  ? nerve stimulator inserted  ? BACK SURGERY    ? screws,  rods replaced with new hardware.  ? BACK SURGERY  11/2016  ? BREAST LUMPECTOMY Left 08/26/2018  ? BREAST SURGERY    ? CARDIAC CATHETERIZATION  12/07/2011  ? Mid LAD 50%, distal LAD 50%, mid RCA 70%, distal RCA 100%   ? CARDIAC CATHETERIZATION  01/04/2011  ? 100% occluded mid RCA with good collaterals from distal LAD, normal LVEF.  ? CHOLECYSTECTOMY    ? COLONOSCOPY  2013  ? COLONOSCOPY WITH PROPOFOL N/A 05/24/2015  ? Procedure: COLONOSCOPY WITH PROPOFOL;  Surgeon: [name], MD;  Location: [facility];  Service: Endoscopy;  Laterality: N/A;  Diabetic - oral meds  ? COLONOSCOPY WITH PROPOFOL N/A 01/28/2019  ? Procedure: COLONOSCOPY WITH PROPOFOL;  Surgeon: [name], MD;  Location: [facility];  Service: Endoscopy;  Laterality: N/A;  ? DIAGNOSTIC  LAPAROSCOPY    ? ESOPHAGOGASTRODUODENOSCOPY (EGD) WITH PROPOFOL N/A 01/28/2019  ? Procedure: ESOPHAGOGASTRODUODENOSCOPY (EGD) WITH PROPOFOL;  Surgeon: [name], MD;  Location: [facility];  Service: Endoscopy;  Laterality: N/A;  ? LAPAROSCOPIC APPENDECTOMY N/A 01/06/2015  ? Procedure: APPENDECTOMY LAPAROSCOPIC;  Surgeon: [name], MD;  Location: [facility];  Service: General;  Laterality: N/A;  ? LEFT HEART CATH AND CORONARY ANGIOGRAPHY Left 04/04/2019  ? Procedure: LEFT HEART CATH AND CORONARY ANGIOGRAPHY;  Surgeon: [name], MD;  Location: [facility];  Service: Cardiovascular;  Laterality: Left;  ? MASTECTOMY W/ SENTINEL NODE BIOPSY Left 08/26/2018  ? Procedure: PARTIAL MASTECTOMY WIDE EXCISION WITH SENTINEL LYMPH NODE BIOPSY LEFT;  Surgeon: [name], MD;  Location: [facility];  Service: General;  Laterality: Left;  ? POLYPECTOMY  05/24/2015  ? Procedure: POLYPECTOMY;  Surgeon: [name], MD;  Location: [facility];  Service: Endoscopy;;  ? REPLACEMENT TOTAL KNEE Right   ? THYROIDECTOMY    ? TOTAL KNEE ARTHROPLASTY Right 11/13/2019  ? TRIGGER FINGER RELEASE    ? ?Social History:  reports that she quit smoking about 14 years ago. Her smoking use included cigarettes. She has a 7.50 pack-year smoking history. She has never used smokeless tobacco. She reports that she does not drink alcohol and does not use drugs. ? ?Allergies  ?Allergen Reactions  ? Hydrocodone Other (See Comments)  ?  Unresponsive  ? Aleve [Naproxen Sodium]   ?  Hives & itching   ? Contrast Media [Iodinated Contrast Media]   ?  Pt avoids due to kidney issues  ? Ioxaglate Other (See Comments)  ?  (iodine) ?Pt avoids due to kidney issues ?  ? Oxycodone Other (See Comments)  ?  hallucinations  ? Ranexa [Ranolazine]   ?  Sick on stomach  ? Sulfa Antibiotics Other (See Comments)  ?  Pt avoids due to kidney issues  ? Tramadol Other (See Comments)  ?  nightmares  ? ? ?Family History  ?Problem  Relation Age of Onset  ? Cancer Mother   ?     colon  ? Hypertension Father   ? Heart disease Father   ? Heart attack Father   ? Diabetes Father   ? Cancer Sister   ?     lung  ? COPD Sister   ? COPD Sister   ? Heart attack Brother   ? Diabetes Brother   ? Hypertension Brother   ? Heart disease Brother   ? Heart attack Brother   ? Diabetes Brother   ? Hypertension Brother   ? Heart disease Brother   ? Heart attack Brother   ? Diabetes  Brother   ? Hypertension Brother   ? Heart disease Brother   ? Colon cancer Brother 40  ? Breast cancer Neg Hx   ? ? ?Prior to Admission medications   ?Medication Sig Start Date End Date Taking? Authorizing Provider  ?albuterol (VENTOLIN HFA) 108 (90 Base) MCG/ACT inhaler Inhale 2 puffs into the lungs every 6 (six) hours as needed for wheezing or shortness of breath. 01/13/21  Yes Johnson, Megan P, DO  ?anastrozole (ARIMIDEX) 1 MG tablet Take 1 tablet (1 mg total) by mouth daily. 10/18/20  Yes Cammie Sickle, MD  ?apixaban (ELIQUIS) 2.5 MG TABS tablet Take 1 tablet (2.5 mg total) by mouth 2 (two) times daily. 02/15/21  Yes Johnson, Megan P, DO  ?blood glucose meter kit and supplies KIT Dispense based on patient and insurance preference. Use one time daily as directed, E11.9 04/23/15  Yes Lada, Satira Anis, MD  ?Calcium Carb-Cholecalciferol (CALCIUM 600 + D) 600-200 MG-UNIT TABS Take 1 tablet by mouth daily.    Yes [provider]  ?cephALEXin (KEFLEX) 250 MG capsule Take 250 mg by mouth daily.   Yes [provider]  ?cetirizine (ZYRTEC) 10 MG tablet TAKE 1 TABLET BY MOUTH DAILY 05/04/20  Yes Johnson, Megan P, DO  ?cholecalciferol (VITAMIN D3) 25 MCG (1000 UT) tablet Take 1,000 Units by mouth daily.   Yes [provider]  ?CRANBERRY PO Take 2 capsules by mouth daily.   Yes [provider]  ?DULoxetine (CYMBALTA) 60 MG capsule Take 2 capsules (120 mg total) by mouth daily. 02/15/21  Yes Johnson, Megan P, DO  ?gabapentin (NEURONTIN) 300 MG capsule Take 1  capsule (300 mg total) by mouth 3 (three) times daily. 08/19/20  Yes Johnson, Megan P, DO  ?glipiZIDE (GLUCOTROL) 5 MG tablet TAKE 1 TABLET BY MOUTH DAILY BEFORE BREAKFAST 05/13/21  Yes Johnson, Megan P, DO

## 2021-06-09 NOTE — Assessment & Plan Note (Signed)
Renal function at baseline 

## 2021-06-09 NOTE — Assessment & Plan Note (Signed)
Elevated lipase with normal hepatic function ?No abdominal pain nausea vomiting.  Could be reactive.  Continue to monitor for appearance of or worsening symptoms ?

## 2021-06-09 NOTE — Discharge Summary (Signed)
? ?Physician Discharge Summary ? ? ?Tammy [name]  female DOB: 06/09/1945  ?TXM:[id number] ? ?PCP: [name], DO ? ?Admit date: 06/08/2021 ?Discharge date: 06/09/2021 ? ?Admitted From: home ?Disposition:  home ?CODE STATUS: Full code ? ?Discharge Instructions   ? ? Discharge instructions   Complete by: As directed ?  ? Your cardiologist Dr. [name] switched you from Toprol to Bisoprolol.  Please follow up with cardiology outpatient. ? ? ?Dr. Enzo Bi ?- ?-  ? ?  ? ?Hospital Course:  ?For full details, please see H&P, progress notes, consult notes and ancillary notes.  ?Briefly,  ?Tammy [name] is a 76 y.o. female with medical history significant for Breast cancer on anastrozole, lung cancer, CKD 3B, HTN, PAD, history of PE on Eliquis, frequent UTIs, DM on insulin, CAD who presented to the ED with chest pain, associated with diaphoresis and an episode of vomiting.  She was brought in by EMS who administered nitroglycerin in route with resolution of the chest pain. ? ?* Chest pain ?Hx of CAD ?--Etiology unclear.  Cardiac enzymes negative, EKG unchanged.  Chest pain resolved after presentation. ?--Less likely DVT PE given no DVT, she is on prophylactic Eliquis 2.5 twice daily. ?--Given hx of CAD, cardiology consulted.  Pt however preferred to do cardiac ischemic work-up at a later date. ?-her metoprolol succinate changed to bisoprolol 10 twice daily to avoid bronchospasm, per cardio rec. ?--cont home Imdur ?--pt will call the office to discuss outpatient Myoview ? ?Ruled out Acute respiratory failure with hypoxia ?COPD ?O2 sat 88% on room air, and later noted to be 89-93% while on RA.  Pt has COPD with CXR showing barrel chest and hyperinflated lungs.  Pt's O2 sats goal are 88-92%.  No need for supplemental O2. ?--cont home bronchodilators  ?  ?Uncontrolled type 2 diabetes mellitus with hyperglycemia, with long-term current use of insulin (HCC) ?--A1c 9.6.  discharged back on home regimen as below.  Further management of  DM per PCP. ?--Pt was not taking empagliflozin PTA, so it was d/c'ed at discharge. ? ?Elevated lipase, mild, not clinically significant ?Elevated lipase 62 with normal hepatic function ?No abdominal pain nausea vomiting.  Could be reactive.   ?  ?Urinary tract infection, ruled out ?--pt received ceftriaxone from ED provider.  No urinary symptoms.  Abx was not continued.  Urine cx returned insignificant growth. ?  ?Hx of PE on Chronic anticoagulation ?Continue Eliquis ?  ?Stage 3b chronic kidney disease ([place]) ?Renal function at baseline ?  ?Carcinoma of lower-inner quadrant of left breast in female, estrogen receptor positive ([place]) ?No acute issues.  Continue anastrozole ?  ?Essential hypertension ?-her metoprolol succinate changed to bisoprolol 10 twice daily to avoid bronchospasm, per cardio rec. ?--cont home Imdur ?  ?PAD (peripheral artery disease) ([place]) ?Continue rosuvastatin ?  ? ?Discharge Diagnoses:  ?Principal Problem: ?  Chest pain ?Active Problems: ?  Acute respiratory failure with hypoxia ([place]) ?  Coronary atherosclerosis of native coronary artery ?  Uncontrolled type 2 diabetes mellitus with hyperglycemia, with long-term current use of insulin ([place]) ?  Urinary tract infection ?  Elevated lipase ?  Chronic anticoagulation ?  PAD (peripheral artery disease) ([place]) ?  Essential hypertension ?  Carcinoma of lower-inner quadrant of left breast in female, estrogen receptor positive ([place]) ?  Stage 3b chronic kidney disease ([place]) ? ? ? ? ?Discharge Instructions: ? ?Allergies as of 06/09/2021   ? ?   Reactions  ? Hydrocodone Other (See Comments)  ?  Unresponsive  ? Aleve [naproxen Sodium]   ? Hives & itching   ? Contrast Media [iodinated Contrast Media]   ? Pt avoids due to kidney issues  ? Ioxaglate Other (See Comments)  ? (iodine) ?Pt avoids due to kidney issues  ? Oxycodone Other (See Comments)  ? hallucinations  ? Ranexa [ranolazine]   ? Sick on stomach  ? Sulfa Antibiotics Other (See Comments)  ? Pt avoids due  to kidney issues  ? Tramadol Other (See Comments)  ? nightmares  ? ?  ? ?  ?Medication List  ?  ? ?STOP taking these medications   ? ?cephALEXin 250 MG capsule ?Commonly known as: KEFLEX ?  ?empagliflozin 25 MG Tabs tablet ?Commonly known as: Jardiance ?  ?metoprolol succinate 50 MG 24 hr tablet ?Commonly known as: TOPROL-XL ?  ? ?  ? ?TAKE these medications   ? ?albuterol 108 (90 Base) MCG/ACT inhaler ?Commonly known as: VENTOLIN HFA ?Inhale 2 puffs into the lungs every 6 (six) hours as needed for wheezing or shortness of breath. ?  ?anastrozole 1 MG tablet ?Commonly known as: ARIMIDEX ?Take 1 tablet (1 mg total) by mouth daily. ?  ?apixaban 2.5 MG Tabs tablet ?Commonly known as: Eliquis ?Take 1 tablet (2.5 mg total) by mouth 2 (two) times daily. ?  ?AZO BLADDER CONTROL/GO-LESS PO ?Take 1 tablet by mouth in the morning and at bedtime. ?  ?bisoprolol 10 MG tablet ?Commonly known as: ZEBETA ?Take 1 tablet (10 mg total) by mouth 2 (two) times daily. ?  ?blood glucose meter kit and supplies Kit ?Dispense based on patient and insurance preference. Use one time daily as directed, E11.9 ?  ?Calcium 600 + D 600-5 MG-MCG Tabs ?Generic drug: Calcium Carb-Cholecalciferol ?Take 1 tablet by mouth daily. ?  ?cetirizine 10 MG tablet ?Commonly known as: ZYRTEC ?TAKE 1 TABLET BY MOUTH DAILY ?  ?cholecalciferol 25 MCG (1000 UNIT) tablet ?Commonly known as: VITAMIN D3 ?Take 1,000 Units by mouth daily. ?  ?CRANBERRY PO ?Take 2 capsules by mouth daily. ?  ?DULoxetine 60 MG capsule ?Commonly known as: CYMBALTA ?Take 2 capsules (120 mg total) by mouth daily. ?  ?gabapentin 300 MG capsule ?Commonly known as: NEURONTIN ?Take 1 capsule (300 mg total) by mouth 3 (three) times daily. ?  ?glipiZIDE 5 MG tablet ?Commonly known as: GLUCOTROL ?TAKE 1 TABLET BY MOUTH DAILY BEFORE BREAKFAST ?  ?isosorbide mononitrate 60 MG 24 hr tablet ?Commonly known as: IMDUR ?TAKE ONE TABLET BY MOUTH AT BEDTIME ?  ?montelukast 10 MG tablet ?Commonly known as:  SINGULAIR ?TAKE 1 TABLET BY MOUTH AT BEDTIME ?  ?Myrbetriq 50 MG Tb24 tablet ?Generic drug: mirabegron ER ?TAKE 1 TABLET BY MOUTH DAILY ?  ?nitroGLYCERIN 0.4 MG SL tablet ?Commonly known as: NITROSTAT ?Place 1 tablet (0.4 mg total) under the tongue every 5 (five) minutes as needed for chest pain. Total of 3 doses ?  ?pantoprazole 40 MG tablet ?Commonly known as: PROTONIX ?Take 1 tablet (40 mg total) by mouth daily. ?  ?Pen Needles 3/16" 31G X 5 MM Misc ?1 each by Does not apply route daily. ?  ?PROBIOTIC ACIDOPHILUS PO ?Take 1 capsule by mouth daily at 12 noon. ?  ?rosuvastatin 40 MG tablet ?Commonly known as: CRESTOR ?Take 1 tablet (40 mg total) by mouth daily. ?  ?senna 8.6 MG Tabs tablet ?Commonly known as: SENOKOT ?Take 8.6 mg by mouth daily as needed for mild constipation. ?  ?Synthroid 150 MCG tablet ?Generic drug: levothyroxine ?TAKE ONE TABLET  ON AN EMPTY STOMACH WITHA GLASS OF WATER AT LEAST 30 TO 60 MINUTES BEFORE BREAKFAST ?  ?Trelegy Ellipta 100-62.5-25 MCG/ACT Aepb ?Generic drug: Fluticasone-Umeclidin-Vilant ?Inhale 1 puff into the lungs daily. ?  ?Tyler Aas FlexTouch 100 UNIT/ML FlexTouch Pen ?Generic drug: insulin degludec ?Inject 30 Units into the skin daily. ?  ?trimethoprim 100 MG tablet ?Commonly known as: TRIMPEX ?TAKE ONE TABLET BY MOUTH EVERY DAY ?  ?Trulicity 4.5 NX/8.3FP Sopn ?Generic drug: Dulaglutide ?SMARTSIG:4.5 Milligram(s) SUB-Q Once a Week ?  ?UNABLE TO FIND ?Med Name: Energy, memory and mood enhancer ?  ?ZINC CITRATE PO ?Take 1 tablet by mouth daily. ?  ? ?  ? ? ? Follow-up Information   ? ? Johnson, Megan P, DO Follow up in 1 week(s).   ?Specialty: Family Medicine ?Contact information: ?[address] ?[place] [address] ?[phone] ? ? ?  ?  ? ? [name], MD .   ?Specialty: Cardiology ?Contact information: ?[address] ?[place] [address] ?[phone] ? ? ?  ?  ? ?  ?  ? ?  ? ? ?Allergies  ?Allergen Reactions  ? Hydrocodone Other (See Comments)  ?   Unresponsive  ? Aleve [Naproxen Sodium]   ?  Hives & itching   ? Contrast Media [Iodinated Contrast Media]   ?  Pt avoids due to kidney issues  ? Ioxaglate Other (See Comments)  ?  (iodine) ?Pt avoids due to kid

## 2021-06-09 NOTE — Progress Notes (Addendum)
Inpatient Diabetes Program Recommendations ? ?AACE/ADA: New Consensus Statement on Inpatient Glycemic Control (2015) ? ?Target Ranges:  Prepandial:   less than 140 mg/dL ?     Peak postprandial:   less than 180 mg/dL (1-2 hours) ?     Critically ill patients:  140 - 180 mg/dL  ? ? Latest Reference Range & Units 06/08/21 19:26 06/08/21 22:45 06/08/21 23:41 06/09/21 04:37  ?Glucose-Capillary 70 - 99 mg/dL 236 (H) ? ?5 units Novolog ? 223 (H) ? ? ? ?125 mg Solumedrol  235 (H) ? ?5 units Novolog ? 365 (H) ? ?15 units Novolog ?  ?(H): Data is abnormally high ? Latest Reference Range & Units 06/08/21 19:11  ?Hemoglobin A1C 4.8 - 5.6 % 9.6 (H) ? ?(228 mg/dl)  ?(H): Data is abnormally high ? ? ?Admit with: CP ? ?History: DM2, Breast and Lung Cancer, CKD ? ?Home DM Meds: Glipizide 5 mg daily ?       Tresiba 30 units daily ?       Jardiance 25 mg daily (NOT taking) ?       Trulicity 4.5 mg Qweek ? ?Current Orders: Lantus 30 units Daily ?    Novolog Moderate Correction Scale/ SSI (0-15 units) TID AC + HS ? ? ? ?Note Semglee to start this AM ? ?Agree with current orders ? ?Will follow ? ? ? ?Addendum 11:30am--Met w/ pt and husband in the ED.  Confirmed home Dm meds (see above)--Jardiance on hold for now given kidney function per pt report.  Spoke with patient about her current A1c of 9.6%.  Pt told me her last A1c was in the 8% range.  Told me she took Prednisone back in late Feb and that her CBGs rose to the 500 range--took several days and several calls to her PCP to get her CBGs back to normal.  Knows her goal A1c is 7% or less to prevent both acute and long-term complications.  Explained to patient the extreme importance of good glucose control at home.  Encouraged patient to check her CBGs at least bid at home (fasting and another check within the day) and to record all CBGs in a logbook for her PCP to review. Pt stated her PCP checks her A1c level about every 3 months and she has F/U appt with her PCP soon.  Pt  appreciative of my visit and did not have any questions for me at this time. ? ? ? ? ? ?--Will follow patient during hospitalization-- ? ?Wyn Quaker RN, MSN, CDE ?Diabetes Coordinator ?Inpatient Glycemic Control Team ?Team Pager: [phone] (8a-5p) ? ?

## 2021-06-09 NOTE — ED Notes (Signed)
Assisted pt to the restroom. Sheets changed and pt repositioned in the bed. Diet Ginger Ale and saltines provided. ?

## 2021-06-09 NOTE — Assessment & Plan Note (Addendum)
Differential includes cardiac, possible PE ?Pain with typical and atypical features, relieved with nitroglycerin but with negative troponins and nonacute EKG ?Concern for PE and spite of chronic anticoagulation given tachycardia and new hypoxia, history of breast cancer ?Unable to get CTA because of renal function and patient unwilling to get contrast ?Bilateral lower extremity venous ultrasound to evaluate for PE ?Consider VQ scan in the a.m. ?Patient is currently on Eliquis 2.5 twice daily.  Will increase to 5 mg twice daily ? ?

## 2021-06-09 NOTE — Assessment & Plan Note (Signed)
Sliding scale insulin tonight ?

## 2021-06-09 NOTE — Assessment & Plan Note (Signed)
Controlled.  Continue metoprolol ?

## 2021-06-10 DIAGNOSIS — Z794 Long term (current) use of insulin: Secondary | ICD-10-CM | POA: Diagnosis not present

## 2021-06-10 DIAGNOSIS — E1165 Type 2 diabetes mellitus with hyperglycemia: Secondary | ICD-10-CM | POA: Diagnosis not present

## 2021-06-10 DIAGNOSIS — J449 Chronic obstructive pulmonary disease, unspecified: Secondary | ICD-10-CM | POA: Diagnosis not present

## 2021-06-10 LAB — URINE CULTURE: Culture: <10000 — AB

## 2021-06-11 ENCOUNTER — Other Ambulatory Visit: Payer: Self-pay | Admitting: Internal Medicine

## 2021-06-11 DIAGNOSIS — Z20822 Contact with and (suspected) exposure to covid-19: Secondary | ICD-10-CM | POA: Diagnosis not present

## 2021-06-13 ENCOUNTER — Telehealth: Payer: Self-pay | Admitting: *Deleted

## 2021-06-13 ENCOUNTER — Telehealth: Payer: Self-pay | Admitting: Cardiovascular Disease

## 2021-06-13 DIAGNOSIS — I25118 Atherosclerotic heart disease of native coronary artery with other forms of angina pectoris: Secondary | ICD-10-CM

## 2021-06-13 NOTE — Telephone Encounter (Signed)
Transition Care Management Unsuccessful Follow-up Telephone Call ? ?Date of discharge and from where:  [facility] ? ?Attempts:  1st Attempt ? ?Reason for unsuccessful TCM follow-up call:  Left voice message ? ?  ?

## 2021-06-13 NOTE — Telephone Encounter (Signed)
Pt states that she was told to have a Stress Test done upon leaving hospital. She is ready to schedule but I do not see an order for this to be done. Please advise ?

## 2021-06-13 NOTE — Telephone Encounter (Signed)
Reviewed the patient's chart. ?She was hospitalized 3/29-3/30/23 for chest pain. ? ?Per Dr. Donivan Scull 06/09/21 consult note: ? ?A/P: ?Chest pain/CAD ?Etiology unclear, had emesis, general malaise, hypoxia ?Cardiac enzymes negative, EKG unchanged, now back to normal ?--She prefers to do cardiac ischemic work-up at a later date ?--Less likely DVT PE given no DVT, she is on prophylactic Eliquis 2.5 twice daily ?-Recommend we change her metoprolol succinate to bisoprolol 10 twice daily to avoid bronchospasm in light of hypoxia ?--We will try to wean off oxygen ?If back to her baseline, could be discharged with work-up as an outpatient ?She will call the office tomorrow or Monday to discuss outpatient Myoview ?Last Myoview was 3 years ago ? ?To MD/ nurse to review. ? ? ?

## 2021-06-14 NOTE — Telephone Encounter (Signed)
Transition Care Management Follow-up Telephone Call ?Date of discharge and from where: [facility]  ?How have you been since you were released from the hospital? Feeling better ?Any questions or concerns? No ? ?Items Reviewed: ?Did the pt receive and understand the discharge instructions provided? Yes  ?Medications obtained and verified? Yes  ?Other? No  ?Any new allergies since your discharge? No  ?Dietary orders reviewed? No ?Do you have support at home? Yes  ? ?Home Care and Equipment/Supplies: ?Were home health services ordered?  ?If so, what is the name of the agency?   ?Has the agency set up a time to come to the patient's home?  ?Were any new equipment or medical supplies ordered?   ?What is the name of the medical supply agency?  ?Were you able to get the supplies/equipment?  ?Do you have any questions related to the use of the equipment or supplies?  ? ?Functional Questionnaire: (I = Independent and D = Dependent) ?ADLs: I ? ?Bathing/Dressing- I ? ?Meal Prep- I ? ?Eating- I ? ?Maintaining continence- I ? ?Transferring/Ambulation- I ? ?Managing Meds- I ? ?Follow up appointments reviewed: ? ?PCP Hospital f/u appt confirmed? Yes  Scheduled to see 06-17-2021 on 12:20 @ [facility]. ?[facility] f/u appt confirmed? NO. ?Are transportation arrangements needed? No  ?If their condition worsens, is the pt aware to call PCP or go to the Emergency Dept.? Yes ?Was the patient provided with contact information for the PCP's office or ED? Yes ?Was to pt encouraged to call back with questions or concerns? Yes ? ?

## 2021-06-15 ENCOUNTER — Ambulatory Visit
Admission: RE | Admit: 2021-06-15 | Discharge: 2021-06-15 | Disposition: A | Discharge status: Home / Self Care | Payer: Medicare Other | Source: Ambulatory Visit | Attending: Radiation Oncology | Admitting: Radiation Oncology

## 2021-06-15 DIAGNOSIS — C3412 Malignant neoplasm of upper lobe, left bronchus or lung: Secondary | ICD-10-CM | POA: Insufficient documentation

## 2021-06-15 DIAGNOSIS — J181 Lobar pneumonia, unspecified organism: Secondary | ICD-10-CM | POA: Diagnosis not present

## 2021-06-15 DIAGNOSIS — C3490 Malignant neoplasm of unspecified part of unspecified bronchus or lung: Secondary | ICD-10-CM | POA: Diagnosis not present

## 2021-06-15 DIAGNOSIS — R911 Solitary pulmonary nodule: Secondary | ICD-10-CM | POA: Diagnosis not present

## 2021-06-15 DIAGNOSIS — J439 Emphysema, unspecified: Secondary | ICD-10-CM | POA: Diagnosis not present

## 2021-06-17 ENCOUNTER — Ambulatory Visit (INDEPENDENT_AMBULATORY_CARE_PROVIDER_SITE_OTHER): Payer: Medicare Other | Admitting: Family Medicine

## 2021-06-17 ENCOUNTER — Other Ambulatory Visit: Payer: Self-pay | Admitting: Family Medicine

## 2021-06-17 ENCOUNTER — Encounter: Payer: Self-pay | Admitting: Family Medicine

## 2021-06-17 VITALS — BP 97/60 | HR 76 | Temp 97.7°F | Wt 218.0 lb

## 2021-06-17 DIAGNOSIS — R079 Chest pain, unspecified: Secondary | ICD-10-CM

## 2021-06-17 DIAGNOSIS — E1165 Type 2 diabetes mellitus with hyperglycemia: Secondary | ICD-10-CM | POA: Diagnosis not present

## 2021-06-17 DIAGNOSIS — R0602 Shortness of breath: Secondary | ICD-10-CM

## 2021-06-17 DIAGNOSIS — J9601 Acute respiratory failure with hypoxia: Secondary | ICD-10-CM | POA: Diagnosis not present

## 2021-06-17 DIAGNOSIS — I10 Essential (primary) hypertension: Secondary | ICD-10-CM

## 2021-06-17 DIAGNOSIS — Z794 Long term (current) use of insulin: Secondary | ICD-10-CM | POA: Diagnosis not present

## 2021-06-17 MED ORDER — TRESIBA FLEXTOUCH 100 UNIT/ML ~~LOC~~ SOPN: 34.0000 [IU] | PEN_INJECTOR | Freq: Every day | SUBCUTANEOUS | 1 refills | Status: DC

## 2021-06-17 NOTE — Progress Notes (Signed)
? ?BP 97/60   Pulse 76   Temp 97.7 ?F (36.5 ?C)   Wt 218 lb (98.9 kg)   SpO2 95%   BMI 35.19 kg/m?   ? ?Subjective:  ? ? Patient ID: Tammy [name], female    DOB: November 18, 1945, 76 y.o.   MRN: [id number] ? ?HPI: ?Tammy [name] is a 76 y.o. female ? ?Chief Complaint  ?Patient presents with  ? Hospitalization Follow-up  ?  Patient here to follow up ED visit, was having shortness of breath, chest pain. Patient states her BS was in the 300's. Patient is still having shortness of breath today and fatigue. Patient was diagnosed with UTI, has been taking antibiotics and seems to be helping.   ? ?Transition of Care Hospital Follow up.  ? ?Hospital/Facility: [facility] ?D/C Physician: Dr. Billie Ruddy ?D/C Date: 06/09/21 ? ?Records Requested: 06/17/21 ?Records Received: 06/17/21 ?Records Reviewed: 06/17/21 ? ?Diagnoses on Discharge: Chest pain ?  Acute respiratory failure with hypoxia ([place]) ?  Coronary atherosclerosis of native coronary artery ?  Uncontrolled type 2 diabetes mellitus with hyperglycemia, with long-term current use of insulin ([place]) ?  Urinary tract infection ?  Elevated lipase ?  Chronic anticoagulation ?  PAD (peripheral artery disease) ([place]) ?  Essential hypertension ?  Carcinoma of lower-inner quadrant of left breast in female, estrogen receptor positive ([place]) ?  Stage 3b chronic kidney disease ([place]) ? ?Date of interactive Contact within 48 hours of discharge: 06/13/21 and 06/14/21 ?Contact was through: phone ? ?Date of 7 day or 14 day face-to-face visit: 06/17/21   within 7 days ? ?Outpatient Encounter Medications as of 06/17/2021  ?Medication Sig  ? albuterol (VENTOLIN HFA) 108 (90 Base) MCG/ACT inhaler Inhale 2 puffs into the lungs every 6 (six) hours as needed for wheezing or shortness of breath.  ? anastrozole (ARIMIDEX) 1 MG tablet TAKE 1 TABLET BY MOUTH DAILY  ? apixaban (ELIQUIS) 2.5 MG TABS tablet Take 1 tablet (2.5 mg total) by mouth 2 (two) times daily.  ? bisoprolol (ZEBETA) 10 MG tablet Take 1 tablet (10 mg total) by mouth 2  (two) times daily.  ? blood glucose meter kit and supplies KIT Dispense based on patient and insurance preference. Use one time daily as directed, E11.9  ? Calcium Carb-Cholecalciferol (CALCIUM 600 + D) 600-200 MG-UNIT TABS Take 1 tablet by mouth daily.   ? cephALEXin (KEFLEX) 500 MG capsule Take 500 mg by mouth 3 (three) times daily.  ? cetirizine (ZYRTEC) 10 MG tablet TAKE 1 TABLET BY MOUTH DAILY  ? cholecalciferol (VITAMIN D3) 25 MCG (1000 UT) tablet Take 1,000 Units by mouth daily.  ? CRANBERRY PO Take 2 capsules by mouth daily.  ? DULoxetine (CYMBALTA) 60 MG capsule Take 2 capsules (120 mg total) by mouth daily.  ? gabapentin (NEURONTIN) 300 MG capsule Take 1 capsule (300 mg total) by mouth 3 (three) times daily.  ? glipiZIDE (GLUCOTROL) 5 MG tablet TAKE 1 TABLET BY MOUTH DAILY BEFORE BREAKFAST  ? Insulin Pen Needle (PEN NEEDLES 3/16") 31G X 5 MM MISC 1 each by Does not apply route daily.  ? isosorbide mononitrate (IMDUR) 60 MG 24 hr tablet TAKE ONE TABLET BY MOUTH AT BEDTIME  ? Lactobacillus (PROBIOTIC ACIDOPHILUS PO) Take 1 capsule by mouth daily at 12 noon.  ? montelukast (SINGULAIR) 10 MG tablet TAKE 1 TABLET BY MOUTH AT BEDTIME  ? MYRBETRIQ 50 MG TB24 tablet TAKE 1 TABLET BY MOUTH DAILY  ? nitroGLYCERIN (NITROSTAT) 0.4 MG SL tablet Place 1 tablet (  0.4 mg total) under the tongue every 5 (five) minutes as needed for chest pain. Total of 3 doses  ? pantoprazole (PROTONIX) 40 MG tablet Take 1 tablet (40 mg total) by mouth daily.  ? Pumpkin Seed-Soy Germ (AZO BLADDER CONTROL/GO-LESS PO) Take 1 tablet by mouth in the morning and at bedtime.  ? senna (SENOKOT) 8.6 MG TABS tablet Take 8.6 mg by mouth daily as needed for mild constipation.  ? SYNTHROID 150 MCG tablet TAKE ONE TABLET ON AN EMPTY STOMACH WITHA GLASS OF WATER AT LEAST 30 TO 60 MINUTES BEFORE BREAKFAST  ? TRELEGY ELLIPTA 100-62.5-25 MCG/ACT AEPB Inhale 1 puff into the lungs daily.  ? trimethoprim (TRIMPEX) 100 MG tablet TAKE ONE TABLET BY MOUTH EVERY  DAY  ? TRULICITY 4.5 WU/8.8BV SOPN SMARTSIG:4.5 Milligram(s) SUB-Q Once a Week  ? UNABLE TO FIND Med Name: Energy, memory and mood enhancer  ? ZINC CITRATE PO Take 1 tablet by mouth daily.  ? [DISCONTINUED] insulin degludec (TRESIBA FLEXTOUCH) 100 UNIT/ML FlexTouch Pen Inject 30 Units into the skin daily.  ? [DISCONTINUED] rosuvastatin (CRESTOR) 40 MG tablet Take 1 tablet (40 mg total) by mouth daily.  ? insulin degludec (TRESIBA FLEXTOUCH) 100 UNIT/ML FlexTouch Pen Inject 34 Units into the skin daily.  ? ?No facility-administered encounter medications on file as of 06/17/2021.  ?Per Hospitalist: "Hospital Course:  ?For full details, please see H&P, progress notes, consult notes and ancillary notes.  ?Briefly,  ?Tammy [name] is a 75 y.o. female with medical history significant for Breast cancer on anastrozole, lung cancer, CKD 3B, HTN, PAD, history of PE on Eliquis, frequent UTIs, DM on insulin, CAD who presented to the ED with chest pain, associated with diaphoresis and an episode of vomiting.  She was brought in by EMS who administered nitroglycerin in route with resolution of the chest pain. ?  ?* Chest pain ?Hx of CAD ?--Etiology unclear.  Cardiac enzymes negative, EKG unchanged.  Chest pain resolved after presentation. ?--Less likely DVT PE given no DVT, she is on prophylactic Eliquis 2.5 twice daily. ?--Given hx of CAD, cardiology consulted.  Pt however preferred to do cardiac ischemic work-up at a later date. ?-her metoprolol succinate changed to bisoprolol 10 twice daily to avoid bronchospasm, per cardio rec. ?--cont home Imdur ?--pt will call the office to discuss outpatient Myoview ?  ?Ruled out Acute respiratory failure with hypoxia ?COPD ?O2 sat 88% on room air, and later noted to be 89-93% while on RA.  Pt has COPD with CXR showing barrel chest and hyperinflated lungs.  Pt's O2 sats goal are 88-92%.  No need for supplemental O2. ?--cont home bronchodilators  ?  ?Uncontrolled type 2 diabetes mellitus with  hyperglycemia, with long-term current use of insulin ([facility]) ?--A1c 9.6.  discharged back on home regimen as below.  Further management of DM per PCP. ?--Pt was not taking empagliflozin PTA, so it was d/c'ed at discharge. ?  ?Elevated lipase, mild, not clinically significant ?Elevated lipase 62 with normal hepatic function ?No abdominal pain nausea vomiting.  Could be reactive.   ?  ?Urinary tract infection, ruled out ?--pt received ceftriaxone from ED provider.  No urinary symptoms.  Abx was not continued.  Urine cx returned insignificant growth. ?  ?Hx of PE on Chronic anticoagulation ?Continue Eliquis ?  ?Stage 3b chronic kidney disease ([place]) ?Renal function at baseline ?  ?Carcinoma of lower-inner quadrant of left breast in female, estrogen receptor positive ([place]) ?No acute issues.  Continue anastrozole ?  ?Essential hypertension ?-  her metoprolol succinate changed to bisoprolol 10 twice daily to avoid bronchospasm, per cardio rec. ?--cont home Imdur ?  ?PAD (peripheral artery disease) ([place]) ?Continue rosuvastatin" ? ?Diagnostic Tests Reviewed:  ?CLINICAL DATA:  Chest pain ?  ?EXAM: ?CHEST - 2 VIEW ?  ?COMPARISON:  04/17/2020 ?  ?FINDINGS: ?Normal cardiac and mediastinal contours. Aortic tortuosity. ?Hyperinflated lungs. Linear opacity in the left upper lobe is ?favored to represent atelectasis. No pleural effusion or ?pneumothorax. No additional pulmonary opacity. Spinal cord ?stimulator. Status post posterior fixation of the lumbar spine, ?partially visualized. No acute osseous abnormality. ?  ?IMPRESSION: ?No acute cardiopulmonary process. Linear opacity in the left upper ?lobe is favored to represent atelectasis. ?  ?CLINICAL DATA:  Hypoxia and lower extremity pain and swelling ?  ?EXAM: ?BILATERAL LOWER EXTREMITY VENOUS DOPPLER ULTRASOUND ?  ?TECHNIQUE: ?Gray-scale sonography with graded compression, as well as color ?Doppler and duplex ultrasound were performed to evaluate the lower ?extremity deep venous  systems from the level of the common femoral ?vein and including the common femoral, femoral, profunda femoral, ?popliteal and calf veins including the posterior tibial, peroneal ?and gastrocnemius veins when vis

## 2021-06-20 ENCOUNTER — Encounter: Payer: Self-pay | Admitting: Family Medicine

## 2021-06-20 ENCOUNTER — Telehealth: Payer: Self-pay | Admitting: Cardiovascular Disease

## 2021-06-20 NOTE — Assessment & Plan Note (Signed)
To have stress test next week. Continue to monitor. Call with any concerns. ?

## 2021-06-20 NOTE — Assessment & Plan Note (Signed)
Stable. Continue to monitor. Call with any concerns.  ?

## 2021-06-20 NOTE — Telephone Encounter (Signed)
Patient calling to go over which medications she needs to hold prior to her stress test tomorrow. Please advise.  ?

## 2021-06-20 NOTE — Assessment & Plan Note (Signed)
Will increase her tresiba to 34units. May need meal time insulin. Will refer to endocrinology. Call with any concerns. Continue to monitor.  ?

## 2021-06-20 NOTE — Telephone Encounter (Signed)
Requested Prescriptions  ?Pending Prescriptions Disp Refills  ?? rosuvastatin (CRESTOR) 40 MG tablet [Pharmacy Med Name: ROSUVASTATIN CALCIUM 40 MG TAB] 90 tablet 1  ?  Sig: TAKE 1 TABLET BY MOUTH DAILY  ?  ? Cardiovascular:  Antilipid - Statins 2 Failed - 06/17/2021  4:36 PM  ?  ?  Failed - Cr in normal range and within 360 days  ?  Creatinine  ?Date Value Ref Range Status  ?09/20/2011 1.31 (H) 0.60 - 1.30 mg/dL Final  ? ?Creatinine, Ser  ?Date Value Ref Range Status  ?06/08/2021 1.50 (H) 0.44 - 1.00 mg/dL Final  ?   ?  ?  Failed - Lipid Panel in normal range within the last 12 months  ?  Cholesterol, Total  ?Date Value Ref Range Status  ?02/15/2021 161 100 - 199 mg/dL Final  ? ?Cholesterol Piccolo, [place]  ?Date Value Ref Range Status  ?01/31/2016 152 <200 mg/dL Final  ?  Comment:  ?                          Desirable                <200 ?                        Borderline High      200- 239 ?                        High                     >239 ?  ? ?LDL Chol Calc (NIH)  ?Date Value Ref Range Status  ?02/15/2021 78 0 - 99 mg/dL Final  ? ?HDL  ?Date Value Ref Range Status  ?02/15/2021 49 >39 mg/dL Final  ? ?Triglycerides  ?Date Value Ref Range Status  ?02/15/2021 204 (H) 0 - 149 mg/dL Final  ? ?Triglycerides Piccolo,Waived  ?Date Value Ref Range Status  ?01/31/2016 224 (H) <150 mg/dL Final  ?  Comment:  ?                          Normal                   <150 ?                        Borderline High     150 - 199 ?                        High                200 - 499 ?                        Very High                >499 ?  ? ?  ?  ?  Passed - Patient is not pregnant  ?  ?  Passed - Valid encounter within last 12 months  ?  Recent Outpatient Visits   ?      ? 3 days ago Uncontrolled type 2 diabetes mellitus with hyperglycemia, with long-term current use of insulin ([place])  ? [place], Megan P, DO  ? 2 months  ago Uncontrolled type 2 diabetes mellitus with hyperglycemia, with long-term current  use of insulin ([place])  ? [place]od[place], Megan P, DO  ? 3 months ago Upper respiratory tract infection, unspecified type  ? [facility], Lauren A, NP  ? 3 months ago Temporal pain  ? [place] P, DO  ? 4 months ago Temporal pain  ? [place], [place] P, DO  ?  ?  ?Future Appointments   ?        ? In 6 months [place], [facility]   ?  ? ?  ?  ?  ? ?

## 2021-06-20 NOTE — Assessment & Plan Note (Signed)
Resolved. O2 level good today. Continue to monitor. Call with any concerns.  ?

## 2021-06-20 NOTE — Telephone Encounter (Signed)
Reviewed medications with patient and advised to hold glipizide the morning of her test and the night before to only take 17 units of the [place]. She verbalized understanding with no further questions at this time.  ?

## 2021-06-21 ENCOUNTER — Encounter: Payer: Self-pay | Admitting: Family Medicine

## 2021-06-21 ENCOUNTER — Encounter
Admission: RE | Admit: 2021-06-21 | Discharge: 2021-06-21 | Disposition: A | Discharge status: Home / Self Care | Payer: Medicare Other | Source: Ambulatory Visit | Attending: Cardiovascular Disease | Admitting: Cardiovascular Disease

## 2021-06-21 ENCOUNTER — Ambulatory Visit (INDEPENDENT_AMBULATORY_CARE_PROVIDER_SITE_OTHER): Payer: Medicare Other | Admitting: Family Medicine

## 2021-06-21 ENCOUNTER — Ambulatory Visit: Payer: Medicare Other | Admitting: Family Medicine

## 2021-06-21 DIAGNOSIS — Z794 Long term (current) use of insulin: Secondary | ICD-10-CM | POA: Diagnosis not present

## 2021-06-21 DIAGNOSIS — I25118 Atherosclerotic heart disease of native coronary artery with other forms of angina pectoris: Secondary | ICD-10-CM | POA: Insufficient documentation

## 2021-06-21 DIAGNOSIS — E1165 Type 2 diabetes mellitus with hyperglycemia: Secondary | ICD-10-CM

## 2021-06-21 MED ORDER — TECHNETIUM TC 99M TETROFOSMIN IV KIT
10.0000 | PACK | Freq: Once | INTRAVENOUS | Status: AC | PRN
  Administered 2021-06-21: 11 via INTRAVENOUS

## 2021-06-21 MED ORDER — TECHNETIUM TC 99M TETROFOSMIN IV KIT
31.5600 | PACK | Freq: Once | INTRAVENOUS | Status: AC | PRN
  Administered 2021-06-21: 31.56 via INTRAVENOUS

## 2021-06-21 MED ORDER — REGADENOSON 0.4 MG/5ML IV SOLN
0.4000 mg | Freq: Once | INTRAVENOUS | Status: AC
Start: 2021-06-21 — End: 2021-06-21
  Administered 2021-06-21: 0.4 mg via INTRAVENOUS

## 2021-06-21 NOTE — Assessment & Plan Note (Signed)
Sugars have improved to the 130s. Having another stress test next week due to mechanical issues. Will continue her on 34 units and recheck by phone in about 10 days. Call with any concerns.  ?

## 2021-06-21 NOTE — Progress Notes (Signed)
? ?There were no vitals taken for this visit.  ? ?Subjective:  ? ? Patient ID: Tammy [name], female    DOB: [birth year], 76 y.o.   MRN: [id number] ? ?HPI: ?Tammy [name] is a 76 y.o. female ? ?Chief Complaint  ?Patient presents with  ? Diabetes  ?  Patient states she was not able to check sugar this morning due to going to get stress test done, states she feels fine today.   ? ?DIABETES ?Hypoglycemic episodes:no ?Polydipsia/polyuria: no ?Visual disturbance: no ?Chest pain: no ?Paresthesias: no ?Glucose Monitoring: yes ? Accucheck frequency: Daily ? Fasting glucose: 130-190 ?Taking Insulin?: no ?Blood Pressure Monitoring: not checking ?Retinal Examination: Up to Date ?Foot Exam: Up to Date ?Diabetic Education: Completed ?Pneumovax: Up to Date ?Influenza: Up to Date ?Aspirin: yes ? ?Relevant past medical, surgical, family and social history reviewed and updated as indicated. Interim medical history since our last visit reviewed. ?Allergies and medications reviewed and updated. ? ?Review of Systems  ?Constitutional: Negative.   ?Respiratory: Negative.    ?Cardiovascular: Negative.   ?Gastrointestinal: Negative.   ?Musculoskeletal: Negative.   ?Neurological: Negative.   ?Psychiatric/Behavioral: Negative.    ? ?Per HPI unless specifically indicated above ? ?   ?Objective:  ?  ?There were no vitals taken for this visit.  ?Wt Readings from Last 3 Encounters:  ?06/17/21 218 lb (98.9 kg)  ?06/08/21 213 lb (96.6 kg)  ?04/18/21 217 lb (98.4 kg)  ?  ?Physical Exam ?Vitals and nursing note reviewed.  ?Pulmonary:  ?   Effort: Pulmonary effort is normal. No respiratory distress.  ?   Comments: Speaking in full sentences ?Neurological:  ?   Mental Status: She is alert.  ?Psychiatric:     ?   Mood and Affect: Mood normal.     ?   Behavior: Behavior normal.     ?   Thought Content: Thought content normal.     ?   Judgment: Judgment normal.  ? ? ?Results for orders placed or performed during the hospital encounter of 06/21/21  ?NM Myocar  Multi W/Spect W/Wall Motion / EF  ?Result Value Ref Range  ? Rest HR 77.0 bpm  ? Rest BP 135/67 mmHg  ? Peak HR 110 bpm  ? Peak BP 160/80 mmHg  ? Percent HR 76.0 %  ? ST Depression (mm) 0 mm  ? ?   ?Assessment & Plan:  ? ?Problem List Items Addressed This Visit   ? ?  ? Endocrine  ? Uncontrolled type 2 diabetes mellitus with hyperglycemia, with long-term current use of insulin ([place]) - Primary  ?  Sugars have improved to the 130s. Having another stress test next week due to mechanical issues. Will continue her on 34 units and recheck by phone in about 10 days. Call with any concerns.  ?  ?  ?  ? ?Follow up plan: ?Return 06/30/21 at 3:00. ? ? ? ? ?This visit was completed via telephone due to the restrictions of the COVID-19 pandemic. All issues as above were discussed and addressed but no physical exam was performed. If it was felt that the patient should be evaluated in the office, they were directed there. The patient verbally consented to this visit. Patient was unable to complete an audio/visual visit due to Lack of equipment. Due to the catastrophic nature of the COVID-19 pandemic, this visit was done through audio contact only. ?Location of the patient: home ?Location of the provider: work ?Those involved with this call:  ?Provider:  Park Liter, DO ?CMA: Louanna Raw, [place] ?Front Desk/Registration: [employer]  ?Time spent on call:  15 minutes on the phone discussing health concerns. 23 minutes total spent in review of patient's record and preparation of their chart. ? ? ?

## 2021-06-21 NOTE — Progress Notes (Signed)
Pt scheduled  

## 2021-06-22 ENCOUNTER — Ambulatory Visit
Admission: RE | Admit: 2021-06-22 | Discharge: 2021-06-22 | Disposition: A | Discharge status: Home / Self Care | Payer: Medicare Other | Source: Ambulatory Visit | Attending: Radiation Oncology | Admitting: Radiation Oncology

## 2021-06-22 ENCOUNTER — Encounter: Payer: Self-pay | Admitting: Radiation Oncology

## 2021-06-22 VITALS — BP 130/67 | HR 72 | Resp 18

## 2021-06-22 DIAGNOSIS — Z87891 Personal history of nicotine dependence: Secondary | ICD-10-CM | POA: Diagnosis not present

## 2021-06-22 DIAGNOSIS — Z08 Encounter for follow-up examination after completed treatment for malignant neoplasm: Secondary | ICD-10-CM | POA: Diagnosis not present

## 2021-06-22 DIAGNOSIS — C3412 Malignant neoplasm of upper lobe, left bronchus or lung: Secondary | ICD-10-CM | POA: Diagnosis not present

## 2021-06-22 DIAGNOSIS — R911 Solitary pulmonary nodule: Secondary | ICD-10-CM | POA: Insufficient documentation

## 2021-06-22 DIAGNOSIS — Z923 Personal history of irradiation: Secondary | ICD-10-CM | POA: Diagnosis not present

## 2021-06-22 DIAGNOSIS — Z170 Estrogen receptor positive status [ER+]: Secondary | ICD-10-CM | POA: Diagnosis not present

## 2021-06-22 DIAGNOSIS — C50312 Malignant neoplasm of lower-inner quadrant of left female breast: Secondary | ICD-10-CM | POA: Insufficient documentation

## 2021-06-22 NOTE — Progress Notes (Signed)
Radiation Oncology ?Follow up Note ? ?Name: Tammy [name]   ?Date:   06/22/2021 ?MRN:  [id number] ?DOB: Jan 25, 1946  ? ? ?This 76 y.o. female presents to the clinic today for 73-month follow-up status post SBRT to her left upper lobe and patient previous treated for left breast cancer.  She was treated for probable stage I non-small cell lung cancer. ? ?REFERRING PROVIDER: [name], DO ? ?HPI: Patient is a 76 year old female now out 10 months having completed SBRT to her left upper lobe for a presumed stage I non-small cell lung cancer.  She has previously been treated for left-sided breast cancer with whole breast radiation stage I disease.  Seen today in routine follow-up she is doing well she is asymptomatic specifically denies cough hemoptysis or chest tightness..  She had a recent CT scan of the chest showing new platelike area of consolidation in the left upper lobe consistent with evolution of radiation changes.  There is also a 2 mm posterior left upper lobe subpleural nodule which has been stable over time. ? ?COMPLICATIONS OF TREATMENT: none ? ?FOLLOW UP COMPLIANCE: keeps appointments  ? ?PHYSICAL EXAM:  ?BP 130/67   Pulse 72   Resp 18  ?Wheelchair-bound female in NAD.  Well-developed well-nourished patient in NAD. HEENT reveals PERLA, EOMI, discs not visualized.  Oral cavity is clear. No oral mucosal lesions are identified. Neck is clear without evidence of cervical or supraclavicular adenopathy. Lungs are clear to A&P. Cardiac examination is essentially unremarkable with regular rate and rhythm without murmur rub or thrill. Abdomen is benign with no organomegaly or masses noted. Motor sensory and DTR levels are equal and symmetric in the upper and lower extremities. Cranial nerves II through XII are grossly intact. Proprioception is intact. No peripheral adenopathy or edema is identified. No motor or sensory levels are noted. Crude visual fields are within normal range. ? ?RADIOLOGY RESULTS: CT scans  reviewed compatible with above-stated findings ? ?PLAN: Present time patient is doing well she is stable by chest CT criteria.  Of asked to see her back in 6 months for follow-up.  Should her chest be stable at that time we will start once year follow-up visits.  Patient knows to call with any concerns. ? ?I would like to take this opportunity to thank you for allowing me to participate in the care of your patient.. ?  ? [name], MD ? ?

## 2021-06-24 ENCOUNTER — Other Ambulatory Visit: Payer: Self-pay

## 2021-06-24 DIAGNOSIS — C50312 Malignant neoplasm of lower-inner quadrant of left female breast: Secondary | ICD-10-CM

## 2021-06-27 ENCOUNTER — Inpatient Hospital Stay: Payer: Medicare Other | Attending: Internal Medicine

## 2021-06-27 ENCOUNTER — Telehealth: Payer: Self-pay | Admitting: Family Medicine

## 2021-06-27 ENCOUNTER — Encounter: Payer: Self-pay | Admitting: Internal Medicine

## 2021-06-27 ENCOUNTER — Inpatient Hospital Stay (HOSPITAL_BASED_OUTPATIENT_CLINIC_OR_DEPARTMENT_OTHER): Payer: Medicare Other | Admitting: Internal Medicine

## 2021-06-27 ENCOUNTER — Other Ambulatory Visit: Payer: Self-pay

## 2021-06-27 DIAGNOSIS — C50312 Malignant neoplasm of lower-inner quadrant of left female breast: Secondary | ICD-10-CM

## 2021-06-27 DIAGNOSIS — Z170 Estrogen receptor positive status [ER+]: Secondary | ICD-10-CM | POA: Insufficient documentation

## 2021-06-27 LAB — CBC WITH DIFFERENTIAL/PLATELET
Abs Immature Granulocytes: 0.08 10*3/uL — ABNORMAL HIGH (ref 0.00–0.07)
Basophils Absolute: 0.1 10*3/uL (ref 0.0–0.1)
Basophils Relative: 1 %
Eosinophils Absolute: 0.3 10*3/uL (ref 0.0–0.5)
Eosinophils Relative: 4 %
HCT: 40.1 % (ref 36.0–46.0)
Hemoglobin: 12.5 g/dL (ref 12.0–15.0)
Immature Granulocytes: 1 %
Lymphocytes Relative: 24 %
Lymphs Abs: 2 10*3/uL (ref 0.7–4.0)
MCH: 27.6 pg (ref 26.0–34.0)
MCHC: 31.2 g/dL (ref 30.0–36.0)
MCV: 88.5 fL (ref 80.0–100.0)
Monocytes Absolute: 0.9 10*3/uL (ref 0.1–1.0)
Monocytes Relative: 12 %
Neutro Abs: 4.8 10*3/uL (ref 1.7–7.7)
Neutrophils Relative %: 58 %
Platelets: 248 10*3/uL (ref 150–400)
RBC: 4.53 MIL/uL (ref 3.87–5.11)
RDW: 15.6 % — ABNORMAL HIGH (ref 11.5–15.5)
WBC: 8.1 10*3/uL (ref 4.0–10.5)
nRBC: 0 % (ref 0.0–0.2)

## 2021-06-27 LAB — COMPREHENSIVE METABOLIC PANEL
ALT: 18 U/L (ref 0–44)
AST: 20 U/L (ref 15–41)
Albumin: 3.4 g/dL — ABNORMAL LOW (ref 3.5–5.0)
Alkaline Phosphatase: 82 U/L (ref 38–126)
Anion gap: 6 (ref 5–15)
BUN: 30 mg/dL — ABNORMAL HIGH (ref 8–23)
CO2: 25 mmol/L (ref 22–32)
Calcium: 8.8 mg/dL — ABNORMAL LOW (ref 8.9–10.3)
Chloride: 103 mmol/L (ref 98–111)
Creatinine, Ser: 1.64 mg/dL — ABNORMAL HIGH (ref 0.44–1.00)
GFR, Estimated: 32 mL/min — ABNORMAL LOW (ref >60)
Glucose, Bld: 273 mg/dL — ABNORMAL HIGH (ref 70–99)
Potassium: 4.8 mmol/L (ref 3.5–5.1)
Sodium: 134 mmol/L — ABNORMAL LOW (ref 135–145)
Total Bilirubin: 0.6 mg/dL (ref 0.3–1.2)
Total Protein: 6.7 g/dL (ref 6.5–8.1)

## 2021-06-27 NOTE — Telephone Encounter (Signed)
Tammy [name]- Can we please change referral to a different endocrinologist- please let me know if I need to put in a new referral ? ?Salena Saner- Can we check on [place] and make sure she's OK? ?

## 2021-06-27 NOTE — Assessment & Plan Note (Addendum)
#  Left upper lobe lung nodule 6 mm MAY 2021-slow-growing currently 9 mm May 2022 CT scan.  Difficulty biopsy-peripheral/subpleural.  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  Recommend repeating evaluation with CT scan in 6 months/ordered by radiation oncology. ? ??# Left breast stage I breast cancer ER PR positive HER-2 negative; status post lumpectomy.  On arimidex. Mammogram-May 2022- left mammo-WNL [Dr.Byrnett- awaiting in May 2023]; STABLE.  tolerating well except for hot flashes see below ? ?# Hot flashes -grade 1- monitor for now.  ? ?# BMD- FEB 2021- T score;  - 1.5.on ca+vit D-STABLE.  ? ?# CKD- III- GFR-32.[NOV 2021-]; STABLE.  Dr.Kolluru/follow-up with PCP. ? ?# Diabetes- Poorly controlled- 270s- defer to PCP.  ? ?# DISPOSITION:  ?#  Follow-up in  6 months MD;  labs-CBC BMP; -Dr.B ? ?# I reviewed the blood work- with the patient in detail; also reviewed the imaging independently [as summarized above]; and with the patient in detail.  ? ?

## 2021-06-27 NOTE — Telephone Encounter (Signed)
Patient states she is okay, she last saw Dr. Ronnald Collum years ago and didn't want to disclose what was said or happened that was inappropriate. She would like to just be sent elsewhere. Advised patient we will send referral somewhere else.  ?

## 2021-06-27 NOTE — Telephone Encounter (Signed)
Please change endocrine referral to alternate location ?

## 2021-06-27 NOTE — Progress Notes (Signed)
I connected with Tammy [name] on 06/27/21 at  1:45 PM EDT by video enabled telemedicine visit and verified that I am speaking with the correct person using two identifiers.  ?I discussed the limitations, risks, security and privacy concerns of performing an evaluation and management service by telemedicine and the availability of in-person appointments. I also discussed with the patient that there may be a patient responsible charge related to this service. The patient expressed understanding and agreed to proceed.  ? ? ?Other persons participating in the visit and their role in the encounter: RN/medical reconciliation ?Patient?s location: office ?Provider?s location: home ? ?Oncology History Overview Note  ? # July 2020 Left Breast- IMC; 14 mm; Grade: 2; Lymphovascular Invasion: Not identified;  pT1c pN0(sn); ER positive, PR positive, HER2 negative. [s/p lumpec; Dr.Byrnett]; rs/p RT [finished Sep, 9th 2020] ? ?# Sep end 2020- START Arimidexa ? ?# JAN 2022- 5m/left upper lobe lung nodule; PET positive; right upper lobe pneumonia/suspected reactive adenopathy. STAGE I Lung ca [suspected based on imaging no biopsy]- SBRT Dr.Crystal [May 2022] ? ?# CKD-III/COPD/ CHF-compesated/PAD; CAD ? ?DIAGNOSIS: Left breast cancer ? ?STAGE: 1  ;GOALS: Cure ? ?CURRENT/MOST RECENT THERAPY: arimidex ?  ?Malignant neoplasm of lower-inner quadrant of left female breast ([place] (Res[place])  ?08/14/2018 Initial Diagnosis  ? Malignant neoplasm of lower-inner quadrant of left female breast ([place] ?  ?Carcinoma of lower-inner quadrant of left breast in female, estrogen receptor positive ([place]  ?10/02/2018 Initial Diagnosis  ? Carcinoma of lower-inner quadrant of left breast in female, estrogen receptor positive ([place] ?  ? ? ? ?Chief Complaint: lung cancer/breast cancer  ? ?History of present illness:Tammy A [id number].o.  female with history of early stage lung cancer; and early stage breast cancer ER/PR positive on anastrozole is here for  follow-up/review results of the CT scan ? ?Patient denies any shortness of breath or cough.  Denies any fever chills.  No headaches. ? ?Complains of mild hot flashes not any worse.  ? ?Observation/objective: Alert & oriented x 3. In No acute distress.  ? ?Assessment and plan: ?Carcinoma of lower-inner quadrant of left breast in female, estrogen receptor positive ([place] ?#Left upper lobe lung nodule 6 mm MAY 2021-slow-growing currently 9 mm May 2022 CT scan.  Difficulty biopsy-peripheral/subpleural.  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  Recommend repeating evaluation with CT scan in 6 months/ordered by radiation oncology. ? ? # Left breast stage I breast cancer ER PR positive HER-2 negative; status post lumpectomy.  On arimidex. Mammogram-May 2022- left mammo-WNL [Dr.Byrnett- awaiting in May 2023]; STABLE.  tolerating well except for hot flashes see below ? ?# Hot flashes -grade 1- monitor for now.  ? ?# BMD- FEB 2021- T score;  - 1.5.on ca+vit D-STABLE.  ? ?# CKD- III- GFR-32.[NOV 2021-]; STABLE.  Dr.Kolluru/follow-up with PCP. ? ?# Diabetes- Poorly controlled- 270s- defer to PCP.  ? ?# DISPOSITION:  ?#  Follow-up in  6 months MD;  labs-CBC BMP; -Dr.B ? ?# I reviewed the blood work- with the patient in detail; also reviewed the imaging independently [as summarized above]; and with the patient in detail.  ? ? ? ? ?Follow-up instructions: ? ?I discussed the assessment and treatment plan with the patient.  The patient was provided an opportunity to ask questions and all were answered.  The patient agreed with the plan and demonstrated understanding of instructions. ? ?  The patient was advised to call back or seek an in person evaluation if the symptoms worsen or if the condition fails to improve as anticipated. ? ? ? ?Dr. Govinda Brahmanday ?[facility] ?06/27/2021 ?2:11 PM ?

## 2021-06-27 NOTE — Telephone Encounter (Signed)
Copied from [place] [id number] [id number]. Topic: Referral - Status ?>> Jun 27, 2021  3:51 PM Alanda Slim E wrote: ?Reason for CRM: pt was referred to Dr. Ronnald Collum / she asked if she can get a referral to a different provider due to something inappropriate that happened with Dr. Ronnald Collum / please advise ?

## 2021-06-28 ENCOUNTER — Telehealth: Payer: Medicare Other

## 2021-06-28 ENCOUNTER — Ambulatory Visit (INDEPENDENT_AMBULATORY_CARE_PROVIDER_SITE_OTHER): Payer: Medicare Other

## 2021-06-28 DIAGNOSIS — N3000 Acute cystitis without hematuria: Secondary | ICD-10-CM

## 2021-06-28 DIAGNOSIS — M546 Pain in thoracic spine: Secondary | ICD-10-CM

## 2021-06-28 DIAGNOSIS — J449 Chronic obstructive pulmonary disease, unspecified: Secondary | ICD-10-CM

## 2021-06-28 DIAGNOSIS — R0602 Shortness of breath: Secondary | ICD-10-CM

## 2021-06-28 DIAGNOSIS — E782 Mixed hyperlipidemia: Secondary | ICD-10-CM

## 2021-06-28 DIAGNOSIS — I10 Essential (primary) hypertension: Secondary | ICD-10-CM

## 2021-06-28 DIAGNOSIS — E1165 Type 2 diabetes mellitus with hyperglycemia: Secondary | ICD-10-CM

## 2021-06-28 DIAGNOSIS — I25118 Atherosclerotic heart disease of native coronary artery with other forms of angina pectoris: Secondary | ICD-10-CM

## 2021-06-28 DIAGNOSIS — R079 Chest pain, unspecified: Secondary | ICD-10-CM

## 2021-06-28 NOTE — Patient Instructions (Addendum)
Visit Information ? ?Thank you for taking time to visit with me today. Please don't hesitate to contact me if I can be of assistance to you before our next scheduled telephone appointment. ? ?Following are the goals we discussed today:  ?RNCM Clinical Goal(s):  ?Patient will verbalize basic understanding of CAD, HTN, HLD, COPD, DMII, and Chronic pain  disease process and self health management plan as evidenced by keeping appointments, taking medications, following dietary restrictions, calling the office for changes in conditions, questions or concerns ?take all medications exactly as prescribed and will call provider for medication related questions as evidenced by compliance with medications, and calling before needed refills     ?attend all scheduled medical appointments: 06-30-2021 at 3 pm as evidenced by keeping appointments and calling for schedule change needs         ?demonstrate improved and ongoing adherence to prescribed treatment plan for CAD, HTN, HLD, COPD, DMII, and Chronic pain  as evidenced by stable conditions and working with the CCM team to effectively manage chronic conditions  ?demonstrate a decrease in CAD, HTN, HLD, COPD, DMII, and Chronic pain  exacerbations  as evidenced by following the plan of care and keeping the pcp and CCM team updated to changes in condition ?demonstrate ongoing self health care management ability to effectively manage chronic conditions  as evidenced by working with the CCM team  through collaboration with Consulting civil engineer, provider, and care team.  ?  ?Interventions: ?1:1 collaboration with primary care provider regarding development and update of comprehensive plan of care as evidenced by provider attestation and co-signature ?Inter-disciplinary care team collaboration (see longitudinal plan of care) ?Evaluation of current treatment plan related to  self management and patient's adherence to plan as established by provider ?  ?  ?CAD  (Status: Goal on Track  (progressing): YES.) Long Term Goal  ?   ?BP Readings from Last 3 Encounters:  ?06/27/21 124/62  ?06/22/21 130/67  ?06/17/21 97/60  ?  ?     ?Lab Results  ?Component Value Date  ?  CHOL 161 02/15/2021  ?  HDL 49 02/15/2021  ?  Antigo 78 02/15/2021  ?  TRIG 204 (H) 02/15/2021  ?  CHOLHDL 3.2 09/07/2015  ?  ?Assessed understanding of CAD diagnosis. 06-28-2021: The patient has a good understanding of her cardiac issues and CAD. Sees specialist on a regular basis. Recently in the ER for evaluation of chest pain and discomfort. ?Medications reviewed including medications utilized in CAD treatment plan. 06-28-2021: The patient is compliant with medications ?Provided education on importance of blood pressure control in management of CAD. 06-28-2021: The patient has better  control of her blood pressures. Did have issues at the end of March with elevations and chest pain. Is having a stress test tomorrow for further evaluation of recent onset of chest pain and discomfort.; ?Provided education on Importance of limiting foods high in cholesterol. 06-28-2021: The patient states that she is compliant with heart healthy/ADA diet ; ?Counseled on importance of regular laboratory monitoring as prescribed. 06-28-2021: The patient is compliant with regular lab work.  ?Counseled on the importance of exercise goals with target of 150 minutes per week ?Reviewed Importance of taking all medications as prescribed. 06-28-2021: The patient takes her medications as prescribed. ?Reviewed Importance of attending all scheduled provider appointments. 06-28-2021: Reminder given of upcoming appointment with Dr. Wynetta Emery on 06-30-2021 at 3 pm ?Advised to report any changes in symptoms or exercise tolerance ?  ?COPD: (Status: Goal on Track (  progressing): YES.) Long Term Goal  ?Reviewed medications with patient, including use of prescribed maintenance and rescue inhalers, and provided instruction on medication management and the importance of adherence.  06-28-2021: The patient is compliant with her medications  ?Provided patient with basic written and verbal COPD education on self care/management/and exacerbation prevention ?Advised patient to track and manage COPD triggers. 06-28-2021: The patient aware of things that trigger her COPD.  ?Provided written and verbal instructions on pursed lip breathing and utilized returned demonstration as teach back ?Provided instruction about proper use of medications used for management of COPD including inhalers ?Advised patient to self assesses COPD action plan zone and make appointment with provider if in the yellow zone for 48 hours without improvement. 04-26-2021: The patient states she needs to get a follow up appointment with the pulmonary provider. She should have already gotten one but she hasn't. She has had several appointments. She states her breathing is not all that good but it is stable. Denies any acute findings related to pulmonary conditions. 06-28-2021: The patient was in the ER with several concerns the end of March. Saturations were low and blood sugars elevated. The patient was having pain and discomfort in her chest. Denies any issues with breathing at this time. Has had follow up appointment with her specialist and pcp on Thursday. ?Advised patient to engage in light exercise as tolerated 3-5 days a week to aid in the the management of COPD ?Provided education about and advised patient to utilize infection prevention strategies to reduce risk of respiratory infection. 06-28-2021: The patient denies any issues with infection control. The patient is mindful of others who are sick and monitoring weather changes.  ?Discussed the importance of adequate rest and management of fatigue with COPD. 06-28-2021: The patient is having an issue with feeling "tired all the time". The patient states she just doesn't feel like doing a lot of things but pushes herself to do so. Education and support. Has upcoming appointments  to discuss new concerns.  ?Screening for signs and symptoms of depression related to chronic disease state  ?Assessed social determinant of health barriers ?  ?Diabetes:  (Status: Goal on Track (progressing): YES.) Long Term Goal  ?  ?     ?Lab Results  ?Component Value Date  ?  HGBA1C 9.6 (H) 06/08/2021  ?Previous 8.0 ?Assessed patient's understanding of A1c goal: <7%. 06-28-2021: The patient knows the goal of A1C is less than <7.0. Has had an exacerbation of her blood sugar readings recently.  ?Provided education to patient about basic DM disease process. 06-28-2021: Review with the patient and education provided.; ?Reviewed medications with patient and discussed importance of medication adherence. 06-28-2021: The patient is compliant with medications. Has been followed closely by pcp due to recent elevations in blood sugars and changes in medications. ;        ?Reviewed prescribed diet with patient heart healthy/ADA diet. 04-26-2021: The patient often skips meals and does not eat lunch. The patient states that she doesn't eat a lot. Discussed eating a light snack when she doesn't want to eat meals.  ?Counseled on importance of regular laboratory monitoring as prescribed. 06-28-2021: The patient has regular lab testing        ?Discussed plans with patient for ongoing care management follow up and provided patient with direct contact information for care management team;      ?Provided patient with written educational materials related to hypo and hyperglycemia and importance of correct treatment. 02-22-2021: The patient had  blood sugar up to 506 after taking prednisone for headache pain. The patient has completed prednisone and is having better blood sugars. This am her blood sugar was 161, and yesterday it was 114. 04-26-2021: States that her blood sugars are "all over the place" and most of the time under 200 but she would like to see them lower. Denies any real high elevations of blood sugars.  06-28-2021: The patient  states that she has been having elevated blood sugars. They are better and coming down and around 130's. A few weeks ago when in the ER her blood sugars were greater than 400. Other readings around 347, 365, and

## 2021-06-28 NOTE — Chronic Care Management (AMB) (Signed)
?Chronic Care Management  ? ?CCM RN Visit Note ? ?06/28/2021 ?Name: Tammy [name] MRN: [id number] DOB: January 20, 1946 ? ?Subjective: ?Tammy [name] is a 76 y.o. year old female who is a primary care patient of [name], DO. The care management team was consulted for assistance with disease management and care coordination needs.   ? ?Engaged with patient by telephone for follow up visit in response to provider referral for case management and/or care coordination services.  ? ?Consent to Services:  ?The patient was given information about Chronic Care Management services, agreed to services, and gave verbal consent prior to initiation of services.  Please see initial visit note for detailed documentation.  ? ?Patient agreed to services and verbal consent obtained.  ? ?Assessment: Review of patient past medical history, allergies, medications, health status, including review of consultants reports, laboratory and other test data, was performed as part of comprehensive evaluation and provision of chronic care management services.  ? ?SDOH (Social Determinants of Health) assessments and interventions performed:   ? ?CCM Care Plan ? ?Allergies  ?Allergen Reactions  ? Hydrocodone Other (See Comments)  ?  Unresponsive  ? Aleve [Naproxen Sodium]   ?  Hives & itching   ? Contrast Media [Iodinated Contrast Media]   ?  Pt avoids due to kidney issues  ? Ioxaglate Other (See Comments)  ?  (iodine) ?Pt avoids due to kidney issues ?  ? Oxycodone Other (See Comments)  ?  hallucinations  ? Ranexa [Ranolazine]   ?  Sick on stomach  ? Sulfa Antibiotics Other (See Comments)  ?  Pt avoids due to kidney issues  ? Tramadol Other (See Comments)  ?  nightmares  ? ? ?Outpatient Encounter Medications as of 06/28/2021  ?Medication Sig  ? albuterol (VENTOLIN HFA) 108 (90 Base) MCG/ACT inhaler Inhale 2 puffs into the lungs every 6 (six) hours as needed for wheezing or shortness of breath.  ? anastrozole (ARIMIDEX) 1 MG tablet TAKE 1 TABLET BY MOUTH  DAILY  ? apixaban (ELIQUIS) 2.5 MG TABS tablet Take 1 tablet (2.5 mg total) by mouth 2 (two) times daily.  ? bisoprolol (ZEBETA) 10 MG tablet Take 1 tablet (10 mg total) by mouth 2 (two) times daily.  ? blood glucose meter kit and supplies KIT Dispense based on patient and insurance preference. Use one time daily as directed, E11.9  ? Calcium Carb-Cholecalciferol (CALCIUM 600 + D) 600-200 MG-UNIT TABS Take 1 tablet by mouth daily.   ? cephALEXin (KEFLEX) 500 MG capsule Take 500 mg by mouth 3 (three) times daily.  ? cetirizine (ZYRTEC) 10 MG tablet TAKE 1 TABLET BY MOUTH DAILY  ? cholecalciferol (VITAMIN D3) 25 MCG (1000 UT) tablet Take 1,000 Units by mouth daily.  ? CRANBERRY PO Take 2 capsules by mouth daily.  ? DULoxetine (CYMBALTA) 60 MG capsule Take 2 capsules (120 mg total) by mouth daily.  ? gabapentin (NEURONTIN) 300 MG capsule Take 1 capsule (300 mg total) by mouth 3 (three) times daily.  ? glipiZIDE (GLUCOTROL) 5 MG tablet TAKE 1 TABLET BY MOUTH DAILY BEFORE BREAKFAST  ? insulin degludec (TRESIBA FLEXTOUCH) 100 UNIT/ML FlexTouch Pen Inject 34 Units into the skin daily.  ? Insulin Pen Needle (PEN NEEDLES 3/16") 31G X 5 MM MISC 1 each by Does not apply route daily.  ? isosorbide mononitrate (IMDUR) 60 MG 24 hr tablet TAKE ONE TABLET BY MOUTH AT BEDTIME  ? Lactobacillus (PROBIOTIC ACIDOPHILUS PO) Take 1 capsule by mouth daily at 12 noon.  ?  montelukast (SINGULAIR) 10 MG tablet TAKE 1 TABLET BY MOUTH AT BEDTIME  ? MYRBETRIQ 50 MG TB24 tablet TAKE 1 TABLET BY MOUTH DAILY  ? nitroGLYCERIN (NITROSTAT) 0.4 MG SL tablet Place 1 tablet (0.4 mg total) under the tongue every 5 (five) minutes as needed for chest pain. Total of 3 doses  ? pantoprazole (PROTONIX) 40 MG tablet Take 1 tablet (40 mg total) by mouth daily.  ? Pumpkin Seed-Soy Germ (AZO BLADDER CONTROL/GO-LESS PO) Take 1 tablet by mouth in the morning and at bedtime.  ? rosuvastatin (CRESTOR) 40 MG tablet TAKE 1 TABLET BY MOUTH DAILY  ? senna (SENOKOT) 8.6 MG  TABS tablet Take 8.6 mg by mouth daily as needed for mild constipation.  ? SYNTHROID 150 MCG tablet TAKE ONE TABLET ON AN EMPTY STOMACH WITHA GLASS OF WATER AT LEAST 30 TO 60 MINUTES BEFORE BREAKFAST  ? TRELEGY ELLIPTA 100-62.5-25 MCG/ACT AEPB Inhale 1 puff into the lungs daily.  ? trimethoprim (TRIMPEX) 100 MG tablet TAKE ONE TABLET BY MOUTH EVERY DAY  ? TRULICITY 4.5 EH/2.0NO SOPN SMARTSIG:4.5 Milligram(s) SUB-Q Once a Week  ? UNABLE TO FIND Med Name: Energy, memory and mood enhancer  ? ZINC CITRATE PO Take 1 tablet by mouth daily.  ? ?No facility-administered encounter medications on file as of 06/28/2021.  ? ? ?Patient Active Problem List  ? Diagnosis Date Noted  ? Acute respiratory failure with hypoxia ([place]) 06/09/2021  ? Chronic anticoagulation 06/09/2021  ? Elevated lipase 06/09/2021  ? Stage 3b chronic kidney disease ([place]) 06/08/2021  ? Intermittent claudication ([place]) 02/15/2021  ? Cervical radiculopathy 10/14/2020  ? Sacroiliac inflammation ([place]) 10/14/2020  ? Lung nodule 08/19/2020  ? Fall 04/17/2020  ? Anemia 02/16/2020  ? Asthma 02/16/2020  ? History of cardiac catheterization 02/16/2020  ? History of left mastectomy 02/16/2020  ? Malignant tumor of breast ([place]) 02/16/2020  ? History of total knee arthroplasty 11/18/2019  ? Unstable angina ([place]) 04/03/2019  ? Dysphagia   ? Thickening of esophagus   ? Stomach irritation   ? Gastric polyp   ? Positive occult stool blood test   ? Polyp of colon   ? Hyperkalemia 11/27/2018  ? Proteinuria 11/27/2018  ? Type 2 diabetes mellitus with stage 3 chronic kidney disease ([place]) 11/27/2018  ? Carcinoma of lower-inner quadrant of left breast in female, estrogen receptor positive ([place]) 10/02/2018  ? Right knee pain 08/01/2017  ? Chronic right-sided low back pain with right-sided sciatica 05/28/2017  ? Gait abnormality 05/28/2017  ? Hypothyroid 07/18/2016  ? COPD (chronic obstructive pulmonary disease) ([place]) 07/18/2016  ? Encephalomalacia on imaging study 12/10/2015  ?  Calcification of both carotid arteries 12/10/2015  ? Benign neoplasm of sigmoid colon   ? Benign neoplasm of cecum   ? Benign neoplasm of ascending colon   ? Bilateral atelectasis 04/11/2015  ? Fatty liver disease, nonalcoholic 70/96/2836  ? Hydronephrosis of right kidney 04/11/2015  ? Abdominal aortic ectasia ([place]) 04/11/2015  ? Constipation 04/07/2015  ? Abdominal aortic atherosclerosis ([place]) 12/18/2014  ? Diverticulosis of colon 12/18/2014  ? Chest pain 11/18/2014  ? Rhinitis, allergic 10/20/2014  ? Coronary artery disease   ? Essential hypertension   ? Morbid obesity due to excess calories ([employer])   ? Spinal stenosis of lumbar region 06/06/2014  ? Herniated nucleus pulposus 06/06/2014  ? Neuralgia neuritis, sciatic nerve 05/24/2014  ? Pulmonary embolism ([place]) 01/30/2014  ? Benign hypertensive renal disease 12/16/2013  ? Uncontrolled type 2 diabetes mellitus with hyperglycemia, with long-term current use  of insulin ([place]) 12/16/2013  ? Chronic kidney disease 12/16/2013  ? Urinary tract infection 11/03/2013  ? Overactive bladder 11/03/2013  ? Coronary atherosclerosis of native coronary artery 06/19/2013  ? History of back surgery 06/19/2013  ? Hyperlipidemia 06/19/2013  ? PAD (peripheral artery disease) ([place]) 06/19/2013  ? Congenital obstruction of ureteropelvic junction 11/21/2011  ? ? ?Conditions to be addressed/monitored:CAD, HTN, HLD, COPD, DMII, and Chronic pain, and recurrent UTI's ? ?Care Plan : RNCM: General Plan of Care (Adult) for Chronic Disease Management and Care Coordination Needs  ?Updates made by [name], RN since 06/28/2021 12:00 AM  ?  ? ?Problem: RNCM; Development of Plan of Care for Chronic Disease Management (DM, CAD, COPD, HTN, HLD, chronic pain)   ?Priority: High  ?  ? ?Long-Range Goal: RNCM: Effective Management of Plan of Care for Chronic Disease Management (DM, CAD, COPD, HTN, HLD, chronic pain)   ?Start Date: 02/22/2021  ?Expected End Date: 02/22/2022  ?Priority: High  ?Note:    ?Current Barriers:  ?Knowledge Deficits related to plan of care for management of CAD, HTN, HLD, COPD, DMII, and chronic pain   ?Chronic Disease Management support and education needs related to CAD, HTN, HLD,

## 2021-06-29 ENCOUNTER — Encounter
Admission: RE | Admit: 2021-06-29 | Discharge: 2021-06-29 | Disposition: A | Discharge status: Home / Self Care | Payer: Medicare Other | Source: Ambulatory Visit | Attending: Cardiovascular Disease | Admitting: Cardiovascular Disease

## 2021-06-29 DIAGNOSIS — I25118 Atherosclerotic heart disease of native coronary artery with other forms of angina pectoris: Secondary | ICD-10-CM

## 2021-06-29 LAB — NM MYOCAR MULTI W/SPECT W/WALL MOTION / EF
LV dias vol: 45 mL (ref 46–106)
LV sys vol: 17 mL
Nuc Stress EF: 62 %
Peak HR: 116 {beats}/min
Percent HR: 81 %
Rest HR: 81 {beats}/min
Rest Nuclear Isotope Dose: 10.7 mCi
SDS: 3
SRS: 4
SSS: 6
ST Depression (mm): 0 mm
Stress Nuclear Isotope Dose: 29.9 mCi
TID: 1.12

## 2021-06-29 MED ORDER — REGADENOSON 0.4 MG/5ML IV SOLN
0.4000 mg | Freq: Once | INTRAVENOUS | Status: AC
Start: 2021-06-29 — End: 2021-06-29
  Administered 2021-06-29: 0.4 mg via INTRAVENOUS

## 2021-06-29 MED ORDER — TECHNETIUM TC 99M TETROFOSMIN IV KIT
30.0000 | PACK | Freq: Once | INTRAVENOUS | Status: AC
  Administered 2021-06-29: 29.88 via INTRAVENOUS

## 2021-06-29 MED ORDER — TECHNETIUM TC 99M TETROFOSMIN IV KIT
10.0000 | PACK | Freq: Once | INTRAVENOUS | Status: AC | PRN
  Administered 2021-06-29: 10.7 via INTRAVENOUS

## 2021-06-30 ENCOUNTER — Encounter (INDEPENDENT_AMBULATORY_CARE_PROVIDER_SITE_OTHER): Payer: Medicare Other | Admitting: Ophthalmology

## 2021-06-30 ENCOUNTER — Encounter: Payer: Self-pay | Admitting: Family Medicine

## 2021-06-30 ENCOUNTER — Telehealth (INDEPENDENT_AMBULATORY_CARE_PROVIDER_SITE_OTHER): Payer: Medicare Other | Admitting: Family Medicine

## 2021-06-30 DIAGNOSIS — R300 Dysuria: Secondary | ICD-10-CM

## 2021-06-30 DIAGNOSIS — N3001 Acute cystitis with hematuria: Secondary | ICD-10-CM | POA: Diagnosis not present

## 2021-06-30 DIAGNOSIS — N261 Atrophy of kidney (terminal): Secondary | ICD-10-CM | POA: Diagnosis not present

## 2021-06-30 DIAGNOSIS — N3020 Other chronic cystitis without hematuria: Secondary | ICD-10-CM | POA: Diagnosis not present

## 2021-06-30 DIAGNOSIS — E1165 Type 2 diabetes mellitus with hyperglycemia: Secondary | ICD-10-CM | POA: Diagnosis not present

## 2021-06-30 DIAGNOSIS — Z794 Long term (current) use of insulin: Secondary | ICD-10-CM

## 2021-06-30 DIAGNOSIS — I25118 Atherosclerotic heart disease of native coronary artery with other forms of angina pectoris: Secondary | ICD-10-CM

## 2021-06-30 DIAGNOSIS — Q6211 Congenital occlusion of ureteropelvic junction: Secondary | ICD-10-CM | POA: Diagnosis not present

## 2021-06-30 LAB — MICROSCOPIC EXAMINATION: WBC, UA: >30 /hpf — ABNORMAL HIGH (ref 0–5)

## 2021-06-30 LAB — URINALYSIS, ROUTINE W REFLEX MICROSCOPIC
Bilirubin, UA: NEGATIVE
Ketones, UA: NEGATIVE
Nitrite, UA: NEGATIVE
Specific Gravity, UA: >1.03 — ABNORMAL HIGH (ref 1.005–1.030)
Urobilinogen, Ur: 0.2 mg/dL (ref 0.2–1.0)
pH, UA: 6 (ref 5.0–7.5)

## 2021-06-30 MED ORDER — NITROFURANTOIN MONOHYD MACRO 100 MG PO CAPS
100.0000 mg | ORAL_CAPSULE | Freq: Two times a day (BID) | ORAL | 0 refills | Status: DC
Start: 2021-06-30 — End: 2021-07-14

## 2021-06-30 MED ORDER — TRESIBA FLEXTOUCH 100 UNIT/ML ~~LOC~~ SOPN: 36.0000 [IU] | PEN_INJECTOR | Freq: Every day | SUBCUTANEOUS | 1 refills | Status: DC

## 2021-06-30 NOTE — Assessment & Plan Note (Signed)
Still running high. Will increase her insulin to 36 units and recheck in 7-10 days. Goal of less than 150 in the AM.  ?

## 2021-06-30 NOTE — Addendum Note (Signed)
Addended by: [name] on: 06/30/2021 04:39 PM ? ? Modules accepted: Orders ? ?

## 2021-06-30 NOTE — Progress Notes (Signed)
? ?There were no vitals taken for this visit.  ? ?Subjective:  ? ? Patient ID: Tammy [name], female    DOB: April 09, 1945, 76 y.o.   MRN: [id number] ? ?HPI: ?Tammy [name] is a 76 y.o. female ? ?Chief Complaint  ?Patient presents with  ? Diabetes  ?  Patient states her sugars have been running in the 180s  ? ?DIABETES ?Hypoglycemic episodes:no ?Polydipsia/polyuria: no ?Visual disturbance: no ?Chest pain: no ?Paresthesias: no ?Glucose Monitoring: no ? Accucheck frequency: Daily ? Fasting glucose: 180 ?Taking Insulin?: yes ? Long acting insulin: 34 units a day ?Blood Pressure Monitoring: not checking ?Retinal Examination: Up to Date ?Foot Exam: Up to Date ?Diabetic Education: Completed ?Pneumovax: Up to Date ?Influenza: Up to Date ? ?URINARY SYMPTOMS ?Duration: about a week ?Dysuria: burning ?Urinary frequency: no ?Urgency: yes ?Small volume voids: yes ?Symptom severity: moderate ?Urinary incontinence: yes ?Foul odor: yes ?Hematuria: no ?Abdominal pain: no ?Back pain: no ?Suprapubic pain/pressure: yes ?Flank pain: no ?Fever:  no ?Vomiting: no ?Relief with cranberry juice: no ?Relief with pyridium: no ?Status: worse ?Previous urinary tract infection: no ?Recurrent urinary tract infection: no ?Sexual activity: No sexually active/monogomous/practicing safe sex ?History of sexually transmitted disease: no ?Vaginal discharge: no ?Treatments attempted: increasing fluids  ? ?Relevant past medical, surgical, family and social history reviewed and updated as indicated. Interim medical history since our last visit reviewed. ?Allergies and medications reviewed and updated. ? ?Review of Systems  ?Constitutional: Negative.   ?Respiratory: Negative.    ?Cardiovascular: Negative.   ?Gastrointestinal: Negative.   ?Genitourinary:  Positive for dysuria, frequency and urgency. Negative for decreased urine volume, difficulty urinating, dyspareunia, enuresis, flank pain, genital sores, hematuria, menstrual problem, pelvic pain, vaginal bleeding,  vaginal discharge and vaginal pain.  ?Musculoskeletal: Negative.   ?Psychiatric/Behavioral: Negative.    ? ?Per HPI unless specifically indicated above ? ?   ?Objective:  ?  ?There were no vitals taken for this visit.  ?Wt Readings from Last 3 Encounters:  ?06/27/21 220 lb 6.4 oz (100 kg)  ?06/17/21 218 lb (98.9 kg)  ?06/08/21 213 lb (96.6 kg)  ?  ?Physical Exam ?Vitals and nursing note reviewed.  ?Constitutional:   ?   General: She is not in acute distress. ?   Appearance: Normal appearance. She is not ill-appearing, toxic-appearing or diaphoretic.  ?HENT:  ?   Head: Normocephalic and atraumatic.  ?   Right Ear: External ear normal.  ?   Left Ear: External ear normal.  ?   Nose: Nose normal.  ?   Mouth/Throat:  ?   Mouth: Mucous membranes are moist.  ?   Pharynx: Oropharynx is clear.  ?Eyes:  ?   General: No scleral icterus.    ?   Right eye: No discharge.     ?   Left eye: No discharge.  ?   Conjunctiva/sclera: Conjunctivae normal.  ?   Pupils: Pupils are equal, round, and reactive to light.  ?Pulmonary:  ?   Effort: Pulmonary effort is normal. No respiratory distress.  ?   Comments: Speaking in full sentences ?Musculoskeletal:     ?   General: Normal range of motion.  ?   Cervical back: Normal range of motion.  ?Skin: ?   Coloration: Skin is not jaundiced or pale.  ?   Findings: No bruising, erythema, lesion or rash.  ?Neurological:  ?   Mental Status: She is alert and oriented to person, place, and time. Mental status is at baseline.  ?Psychiatric:     ?  Mood and Affect: Mood normal.     ?   Behavior: Behavior normal.     ?   Thought Content: Thought content normal.     ?   Judgment: Judgment normal.  ? ? ?Results for orders placed or performed in visit on 06/27/21  ?CBC with Differential  ?Result Value Ref Range  ? WBC 8.1 4.0 - 10.5 K/uL  ? RBC 4.53 3.87 - 5.11 MIL/uL  ? Hemoglobin 12.5 12.0 - 15.0 g/dL  ? HCT 40.1 36.0 - 46.0 %  ? MCV 88.5 80.0 - 100.0 fL  ? MCH 27.6 26.0 - 34.0 pg  ? MCHC 31.2 30.0 - 36.0  g/dL  ? RDW 15.6 (H) 11.5 - 15.5 %  ? Platelets 248 150 - 400 K/uL  ? nRBC 0.0 0.0 - 0.2 %  ? Neutrophils Relative % 58 %  ? Neutro Abs 4.8 1.7 - 7.7 K/uL  ? Lymphocytes Relative 24 %  ? Lymphs Abs 2.0 0.7 - 4.0 K/uL  ? Monocytes Relative 12 %  ? Monocytes Absolute 0.9 0.1 - 1.0 K/uL  ? Eosinophils Relative 4 %  ? Eosinophils Absolute 0.3 0.0 - 0.5 K/uL  ? Basophils Relative 1 %  ? Basophils Absolute 0.1 0.0 - 0.1 K/uL  ? Immature Granulocytes 1 %  ? Abs Immature Granulocytes 0.08 (H) 0.00 - 0.07 K/uL  ?Comprehensive metabolic panel  ?Result Value Ref Range  ? Sodium 134 (L) 135 - 145 mmol/L  ? Potassium 4.8 3.5 - 5.1 mmol/L  ? Chloride 103 98 - 111 mmol/L  ? CO2 25 22 - 32 mmol/L  ? Glucose, Bld 273 (H) 70 - 99 mg/dL  ? BUN 30 (H) 8 - 23 mg/dL  ? Creatinine, Ser 1.64 (H) 0.44 - 1.00 mg/dL  ? Calcium 8.8 (L) 8.9 - 10.3 mg/dL  ? Total Protein 6.7 6.5 - 8.1 g/dL  ? Albumin 3.4 (L) 3.5 - 5.0 g/dL  ? AST 20 15 - 41 U/L  ? ALT 18 0 - 44 U/L  ? Alkaline Phosphatase 82 38 - 126 U/L  ? Total Bilirubin 0.6 0.3 - 1.2 mg/dL  ? GFR, Estimated 32 (L) >60 mL/min  ? Anion gap 6 5 - 15  ? ?   ?Assessment & Plan:  ? ?Problem List Items Addressed This Visit   ? ?  ? Endocrine  ? Uncontrolled type 2 diabetes mellitus with hyperglycemia, with long-term current use of insulin ([place])  ?  Still running high. Will increase her insulin to 36 units and recheck in 7-10 days. Goal of less than 150 in the AM.  ? ?  ?  ? Relevant Medications  ? insulin degludec (TRESIBA FLEXTOUCH) 100 UNIT/ML FlexTouch Pen  ? ?Other Visit Diagnoses   ? ? Dysuria    -  Primary  ? Will check labs. Await results. Treat as needed.   ? Relevant Orders  ? Urinalysis, Routine w reflex microscopic  ? ?  ?  ? ?Follow up plan: ?Return 7-10 days virtual OK. ? ? ?This visit was completed via video visit through MyChart due to the restrictions of the COVID-19 pandemic. All issues as above were discussed and addressed. Physical exam was done as above through visual  confirmation on video through MyChart. If it was felt that the patient should be evaluated in the office, they were directed there. The patient verbally consented to this visit. ?Location of the patient: home ?Location of the provider: work ?Those involved with this call:  ?  Provider: Park Liter, DO ?CMA: Louanna Raw, [place] ?Front Desk/Registration: [employer]  ?Time spent on call:  25 minutes with patient face to face via video conference. More than 50% of this time was spent in counseling and coordination of care. 40 minutes total spent in review of patient's record and preparation of their chart. ? ? ? ? ?

## 2021-06-30 NOTE — Progress Notes (Signed)
Patient scheduled.

## 2021-07-01 ENCOUNTER — Telehealth: Payer: Self-pay | Admitting: Emergency Medicine

## 2021-07-01 ENCOUNTER — Telehealth: Payer: Self-pay

## 2021-07-01 NOTE — Telephone Encounter (Signed)
Called and spoke with patient. Results reviewed with patient, pt verbalized understanding,  questions (if any) answered.   ?

## 2021-07-01 NOTE — Chronic Care Management (AMB) (Signed)
? ? ?Chronic Care Management ?Pharmacy Assistant  ? ?Name: Tammy [name]  MRN: [id number] DOB: 09/24/1945 ? ?Reason for Encounter: Disease State Diabetes Mellitus ? ? ?Recent office visits:  ?06/30/21-Tammy Annia Friendly, DO (PCP, Video visit) Diabetic follow up. Will increase her insulin to 36 units and recheck in 7-10 days. Follow up in 7-10 days virtually. ?06/21/21-Tammy Annia Friendly, DO (PCP, Video visit) Diabetic follow up visit. Follow up on 06/30/21. ?06/17/21-Tammy PWynetta Emery, DO (pcp) Hospital follow up visit. Will increase Tresiba to 34 units. Ambulatory referral to Endocrinology and AMB Referral to [place].Follow up in 4 days. ? ?Recent consult visits:  ?0/07/23-Tammy Ann Lions, MD (Oncology) Seen for lung cancer and breast cancer. Follow up in 6 months. ? ?Hospital visits:  ?Medication Reconciliation was completed by comparing discharge summary, patient?s EMR and Pharmacy list, and upon discussion with patient. ? ?Admitted to the hospital on 06/08/21 due to chest pain. Discharge date was 06/09/21. Discharged from [facility].   ? ?New?Medications Started at [facility] Discharge:?? ?-started none noted ? ?Medication Changes at Hospital Discharge: ?-Changed none noted ? ?Medications Discontinued at Hospital Discharge: ?-Stopped none noted ? ?Medications that remain the same after Hospital Discharge:??  ?-All other medications will remain the same.   ? ?Medications: ?Outpatient Encounter Medications as of 07/01/2021  ?Medication Sig  ? albuterol (VENTOLIN HFA) 108 (90 Base) MCG/ACT inhaler Inhale 2 puffs into the lungs every 6 (six) hours as needed for wheezing or shortness of breath.  ? anastrozole (ARIMIDEX) 1 MG tablet TAKE 1 TABLET BY MOUTH DAILY  ? apixaban (ELIQUIS) 2.5 MG TABS tablet Take 1 tablet (2.5 mg total) by mouth 2 (two) times daily.  ? bisoprolol (ZEBETA) 10 MG tablet Take 1 tablet (10 mg total) by mouth 2 (two) times daily.  ? blood glucose meter kit and  supplies KIT Dispense based on patient and insurance preference. Use one time daily as directed, E11.9  ? Calcium Carb-Cholecalciferol (CALCIUM 600 + D) 600-200 MG-UNIT TABS Take 1 tablet by mouth daily.   ? cephALEXin (KEFLEX) 500 MG capsule Take 500 mg by mouth 3 (three) times daily.  ? cetirizine (ZYRTEC) 10 MG tablet TAKE 1 TABLET BY MOUTH DAILY  ? cholecalciferol (VITAMIN D3) 25 MCG (1000 UT) tablet Take 1,000 Units by mouth daily.  ? CRANBERRY PO Take 2 capsules by mouth daily.  ? DULoxetine (CYMBALTA) 60 MG capsule Take 2 capsules (120 mg total) by mouth daily.  ? gabapentin (NEURONTIN) 300 MG capsule Take 1 capsule (300 mg total) by mouth 3 (three) times daily.  ? glipiZIDE (GLUCOTROL) 5 MG tablet TAKE 1 TABLET BY MOUTH DAILY BEFORE BREAKFAST  ? insulin degludec (TRESIBA FLEXTOUCH) 100 UNIT/ML FlexTouch Pen Inject 36 Units into the skin daily.  ? Insulin Pen Needle (PEN NEEDLES 3/16") 31G X 5 MM MISC 1 each by Does not apply route daily.  ? isosorbide mononitrate (IMDUR) 60 MG 24 hr tablet TAKE ONE TABLET BY MOUTH AT BEDTIME  ? Lactobacillus (PROBIOTIC ACIDOPHILUS PO) Take 1 capsule by mouth daily at 12 noon.  ? montelukast (SINGULAIR) 10 MG tablet TAKE 1 TABLET BY MOUTH AT BEDTIME  ? MYRBETRIQ 50 MG TB24 tablet TAKE 1 TABLET BY MOUTH DAILY  ? nitrofurantoin, macrocrystal-monohydrate, (MACROBID) 100 MG capsule Take 1 capsule (100 mg total) by mouth 2 (two) times daily.  ? nitroGLYCERIN (NITROSTAT) 0.4 MG SL tablet Place 1 tablet (0.4 mg total) under the tongue every 5 (five) minutes as needed for chest pain. Total of  3 doses  ? pantoprazole (PROTONIX) 40 MG tablet Take 1 tablet (40 mg total) by mouth daily.  ? Pumpkin Seed-Soy Germ (AZO BLADDER CONTROL/GO-LESS PO) Take 1 tablet by mouth in the morning and at bedtime.  ? rosuvastatin (CRESTOR) 40 MG tablet TAKE 1 TABLET BY MOUTH DAILY  ? senna (SENOKOT) 8.6 MG TABS tablet Take 8.6 mg by mouth daily as needed for mild constipation.  ? SYNTHROID 150 MCG tablet  TAKE ONE TABLET ON AN EMPTY STOMACH WITHA GLASS OF WATER AT LEAST 30 TO 60 MINUTES BEFORE BREAKFAST  ? TRELEGY ELLIPTA 100-62.5-25 MCG/ACT AEPB Inhale 1 puff into the lungs daily.  ? trimethoprim (TRIMPEX) 100 MG tablet TAKE ONE TABLET BY MOUTH EVERY DAY  ? TRULICITY 4.5 CM/0.3KJ SOPN SMARTSIG:4.5 Milligram(s) SUB-Q Once a Week  ? UNABLE TO FIND Med Name: Energy, memory and mood enhancer  ? ZINC CITRATE PO Take 1 tablet by mouth daily.  ? ?No facility-administered encounter medications on file as of 07/01/2021.  ? ?Current antihyperglycemic regimen:  ?Glipizide 5 mg 1 tab daily ?Trulciity 4.5 mg inject 4.5 mg weekly ?Tresiba 100 unit inject 36 units daily ? ?What recent interventions/DTPs have been made to improve glycemic control:  ?06/30/21:Will increase her insulin to 36 units  ?06/17/21:Will increase Tresiba to 34 units ? ?Have there been any recent hospitalizations or ED visits since last visit with CPP? Yes ? ?Patient denies hypoglycemic symptoms, including Pale, Sweaty, Shaky, Hungry, Nervous/irritable, and Vision changes ? ?Patient denies hyperglycemic symptoms, including blurry vision, excessive thirst, fatigue, polyuria, and weakness ? ?How often are you checking your blood sugar? once daily ? ?What are your blood sugars ranging?  ?Fasting: 122-140 ?Before meals:  ?After meals:  ?Bedtime:  ? ?During the week, how often does your blood glucose drop below 70? Never ? ?Are you checking your feet daily/regularly?  ?Patient states she does check her feet daily. ? ?Adherence Review: ?Is the patient currently on a STATIN medication? Yes ?Is the patient currently on ACE/ARB medication? Yes ?Does the patient have >5 day gap between last estimated fill dates? No  ? ?Care Gaps: ?Zoster Vaccines:Never done ?COLONOSCOPY:Last completed: Jan 28, 2019 ?URINE MICROALBUMIN:Last completed: Aug 19, 2020 ? ?Star Rating Drugs: ?Rosuvastatin 40 mg Last filled:06/17/21 90 DS ?Trulciity 4.5 mg Last filled:06/14/21 28 DS ?Glipizde 5  mg Last filled:05/13/21 90 DS ? ?Corrie Mckusick, RMA ?Health Concierge ? ?

## 2021-07-01 NOTE — Telephone Encounter (Signed)
-----   Message from [name], MD sent at 06/30/2021  5:10 PM EDT ----- ?Stress test Myoview ?No significant ischemia ?Low risk study, normal ejection fraction ?

## 2021-07-06 ENCOUNTER — Other Ambulatory Visit: Payer: Self-pay | Admitting: Urology

## 2021-07-06 ENCOUNTER — Other Ambulatory Visit (HOSPITAL_COMMUNITY): Payer: Self-pay | Admitting: Urology

## 2021-07-06 DIAGNOSIS — Q6211 Congenital occlusion of ureteropelvic junction: Secondary | ICD-10-CM

## 2021-07-10 DIAGNOSIS — I10 Essential (primary) hypertension: Secondary | ICD-10-CM | POA: Diagnosis not present

## 2021-07-10 DIAGNOSIS — Z794 Long term (current) use of insulin: Secondary | ICD-10-CM

## 2021-07-10 DIAGNOSIS — E1165 Type 2 diabetes mellitus with hyperglycemia: Secondary | ICD-10-CM

## 2021-07-10 DIAGNOSIS — E782 Mixed hyperlipidemia: Secondary | ICD-10-CM | POA: Diagnosis not present

## 2021-07-10 DIAGNOSIS — J449 Chronic obstructive pulmonary disease, unspecified: Secondary | ICD-10-CM

## 2021-07-10 DIAGNOSIS — I25118 Atherosclerotic heart disease of native coronary artery with other forms of angina pectoris: Secondary | ICD-10-CM | POA: Diagnosis not present

## 2021-07-12 ENCOUNTER — Telehealth (INDEPENDENT_AMBULATORY_CARE_PROVIDER_SITE_OTHER): Payer: Medicare Other | Admitting: Family Medicine

## 2021-07-12 ENCOUNTER — Encounter: Payer: Self-pay | Admitting: Family Medicine

## 2021-07-12 DIAGNOSIS — N644 Mastodynia: Secondary | ICD-10-CM | POA: Diagnosis not present

## 2021-07-12 DIAGNOSIS — E1165 Type 2 diabetes mellitus with hyperglycemia: Secondary | ICD-10-CM

## 2021-07-12 DIAGNOSIS — Z794 Long term (current) use of insulin: Secondary | ICD-10-CM | POA: Diagnosis not present

## 2021-07-12 NOTE — Progress Notes (Signed)
? ?There were no vitals taken for this visit.  ? ?Subjective:  ? ? Patient ID: Tammy [name], female    DOB: 05/12/1945, 76 y.o.   MRN: [id number] ? ?HPI: ?Tammy [name] is a 76 y.o. female ? ?Chief Complaint  ?Patient presents with  ? Diabetes  ?  Patient BS was 240 this morning at 8:00 a.m. Patient blood sugar was 233 at 1:30. Patient states her blood sugar has been running in the 120's -140's   ? ?DIABETES ?Hypoglycemic episodes:no ?Polydipsia/polyuria: no ?Visual disturbance: no ?Chest pain: yes- breast pain ?Paresthesias: no ?Glucose Monitoring: yes ? Accucheck frequency: Daily ? Fasting glucose: 120s-140s ?Taking Insulin?: yes ? Long acting insulin: 36 units ?Blood Pressure Monitoring: not checking ?Retinal Examination: Not up to Date ?Foot Exam: Up to Date ?Diabetic Education: Completed ?Pneumovax: Up to Date ?Influenza: Up to Date ?Aspirin: no ? ?BREAST PAIN ?Duration :days ?Location: left breast on the R side ?Onset: sudden ?Severity: severe ?Quality: sharp ?Frequency: intermittent ?Redness: no ?Swelling: no ?Trauma: no trauma ?Breastfeeding: no ?Associated with menstral cycle:  N/A ?Nipple discharge: no ?Breast lump: no ?Status: worse ?Treatments attempted: ibuprofen ?Previous mammogram: yes ? ?Relevant past medical, surgical, family and social history reviewed and updated as indicated. Interim medical history since our last visit reviewed. ?Allergies and medications reviewed and updated. ? ?Review of Systems  ?Constitutional: Negative.   ?Respiratory: Negative.    ?Cardiovascular:  Positive for chest pain. Negative for palpitations and leg swelling.  ?Gastrointestinal: Negative.   ?Musculoskeletal: Negative.   ?Skin: Negative.   ?Psychiatric/Behavioral: Negative.    ? ?Per HPI unless specifically indicated above ? ?   ?Objective:  ?  ?There were no vitals taken for this visit.  ?Wt Readings from Last 3 Encounters:  ?06/27/21 220 lb 6.4 oz (100 kg)  ?06/17/21 218 lb (98.9 kg)  ?06/08/21 213 lb (96.6 kg)  ?   ?Physical Exam ?Vitals and nursing note reviewed.  ?Pulmonary:  ?   Effort: Pulmonary effort is normal. No respiratory distress.  ?   Comments: Speaking in full sentences ?Neurological:  ?   Mental Status: She is alert.  ?Psychiatric:     ?   Mood and Affect: Mood normal.     ?   Behavior: Behavior normal.     ?   Thought Content: Thought content normal.     ?   Judgment: Judgment normal.  ? ? ?Results for orders placed or performed in visit on 06/30/21  ?Microscopic Examination  ? Urine  ?Result Value Ref Range  ? WBC, UA >30 (H) 0 - 5 /hpf  ? RBC 3-10 (A) 0 - 2 /hpf  ? Epithelial Cells (non renal) 0-10 0 - 10 /hpf  ? Mucus, UA Present (A) Not Estab.  ? Bacteria, UA Moderate (A) None seen/Few  ?Urinalysis, Routine w reflex microscopic  ?Result Value Ref Range  ? Specific Gravity, UA >1.030 (H) 1.005 - 1.030  ? pH, UA 6.0 5.0 - 7.5  ? Color, UA Yellow Yellow  ? Appearance Ur Cloudy (A) Clear  ? Leukocytes,UA 1+ (A) Negative  ? Protein,UA 2+ (A) Negative/Trace  ? Glucose, UA 1+ (A) Negative  ? Ketones, UA Negative Negative  ? RBC, UA 1+ (A) Negative  ? Bilirubin, UA Negative Negative  ? Urobilinogen, Ur 0.2 0.2 - 1.0 mg/dL  ? Nitrite, UA Negative Negative  ? Microscopic Examination See below:   ? ?   ?Assessment & Plan:  ? ?Problem List Items Addressed This Visit   ? ?  ?  Endocrine  ? Uncontrolled type 2 diabetes mellitus with hyperglycemia, with long-term current use of insulin ([place])  ?  Sugars doing better. Will continue current regimen. Recheck A1c in June. Call with any concerns.  ? ?  ?  ? ?Other Visit Diagnoses   ? ? Breast pain, left    -  Primary  ? Started yesterday. Due for mammo on Monday. Will change to diagnostic. Seeing breast surgeon in 2 weeks. We will send him copy of today's note as FYI.  ? Relevant Orders  ? MM DIAG BREAST TOMO BILATERAL  ? US BREAST LTD UNI LEFT INC AXILLA  ? ?  ?  ? ?Follow up plan: ?Return End of June. ? ? ? ?This visit was completed via telephone due to the restrictions of the  COVID-19 pandemic. All issues as above were discussed and addressed but no physical exam was performed. If it was felt that the patient should be evaluated in the office, they were directed there. The patient verbally consented to this visit. Patient was unable to complete an audio/visual visit due to Lack of equipment. Due to the catastrophic nature of the COVID-19 pandemic, this visit was done through audio contact only. ?Location of the patient: home ?Location of the provider: work ?Those involved with this call:  ?Provider: Park Liter, DO ?CMA: Louanna Raw, [place] ?Front Desk/Registration: [employer]  ?Time spent on call:  15 minutes on the phone discussing health concerns. 23 minutes total spent in review of patient's record and preparation of their chart. ? ? ? ?

## 2021-07-12 NOTE — Progress Notes (Signed)
Patient scheduled.

## 2021-07-12 NOTE — Assessment & Plan Note (Signed)
Sugars doing better. Will continue current regimen. Recheck A1c in June. Call with any concerns.  ?

## 2021-07-14 ENCOUNTER — Encounter: Payer: Self-pay | Admitting: Pulmonary Disease

## 2021-07-14 ENCOUNTER — Ambulatory Visit (INDEPENDENT_AMBULATORY_CARE_PROVIDER_SITE_OTHER): Payer: Medicare Other | Admitting: Pulmonary Disease

## 2021-07-14 ENCOUNTER — Other Ambulatory Visit: Payer: Self-pay | Admitting: Family Medicine

## 2021-07-14 VITALS — BP 108/64 | HR 73 | Temp 97.7°F | Ht 66.0 in | Wt 218.0 lb

## 2021-07-14 DIAGNOSIS — R0609 Other forms of dyspnea: Secondary | ICD-10-CM

## 2021-07-14 DIAGNOSIS — I25118 Atherosclerotic heart disease of native coronary artery with other forms of angina pectoris: Secondary | ICD-10-CM | POA: Diagnosis not present

## 2021-07-14 DIAGNOSIS — J449 Chronic obstructive pulmonary disease, unspecified: Secondary | ICD-10-CM | POA: Diagnosis not present

## 2021-07-14 DIAGNOSIS — J301 Allergic rhinitis due to pollen: Secondary | ICD-10-CM | POA: Diagnosis not present

## 2021-07-14 MED ORDER — ALBUTEROL SULFATE (2.5 MG/3ML) 0.083% IN NEBU: 2.5000 mg | INHALATION_SOLUTION | Freq: Four times a day (QID) | RESPIRATORY_TRACT | 12 refills | Status: DC | PRN

## 2021-07-14 MED ORDER — FLUTICASONE PROPIONATE 50 MCG/ACT NA SUSP
1.0000 | Freq: Every day | NASAL | 2 refills | Status: DC
Start: 2021-07-14 — End: 2023-07-12

## 2021-07-14 MED ORDER — AZELASTINE HCL 0.15 % NA SOLN: 1.0000 | Freq: Two times a day (BID) | NASAL | 5 refills | Status: DC

## 2021-07-14 NOTE — Telephone Encounter (Signed)
Requested medication (s) are due for refill today:   No ? ?Requested medication (s) are on the active medication list:   Yes ? ?Future visit scheduled:   Yes ? ? ?Last ordered: 04/15/2021 #90, 1 refill ? ?Returned because out of refills.   No protocol assigned to this medication  ? ?Requested Prescriptions  ?Pending Prescriptions Disp Refills  ? trimethoprim (TRIMPEX) 100 MG tablet [Pharmacy Med Name: TRIMETHOPRIM 100 MG TAB] 90 tablet 1  ?  Sig: TAKE ONE TABLET BY MOUTH EVERY DAY  ?  ? Off-Protocol Failed - 07/14/2021 10:49 AM  ?  ?  Failed - Medication not assigned to a protocol, review manually.  ?  ?  Passed - Valid encounter within last 12 months  ?  Recent Outpatient Visits   ? ?      ? 2 days ago Breast pain, left  ? [place], [place] P, DO  ? 2 weeks ago Uncontrolled type 2 diabetes mellitus with hyperglycemia, with long-term current use of insulin ([place])  ? [place], [place] P, DO  ? 3 weeks ago Uncontrolled type 2 diabetes mellitus with hyperglycemia, with long-term current use of insulin ([place])  ? [place], [place] P, DO  ? 3 weeks ago Acute respiratory failure with hypoxia ([place])  ? [place], Megan P, DO  ? 3 months ago Uncontrolled type 2 diabetes mellitus with hyperglycemia, with long-term current use of insulin ([place])  ? [place], [place] P, DO  ? ?  ?  ?Future Appointments   ? ?        ? In 1 month Wynetta Emery, Barb Merino, DO [facility], [facility]  ? In 1 month Martyn Ehrich, NP [facility] [place]  ? In 5 months  [place]  ? ?  ? ? ?  ?  ?  ? ?

## 2021-07-14 NOTE — Patient Instructions (Signed)
Astepro 1 spray in each nostril in the morning and in the evening ? ?Flonase 1 spray in each nostril nightly ? ?Follow up in 4 weeks with Dr. Patsey Berthold or a Nurse Practitioner ?

## 2021-07-14 NOTE — Progress Notes (Signed)
Chief Complaint  ?Patient presents with  ? Acute Visit  ?  Sob with exertion, prod cough with clear sputum and occ wheezing.   ? ? ?Summary: ?Tammy [name] is a 76 y.o. female former smoker followed by Dr. Patsey [name] for COPD and lung cancer. ? ?History: ?She is here with her husband. ? ?She has recurrent UTIs.  She is followed by urology.  She was on keflex and more recently started on nitrofurantion.  She felt feverish about a week ago.  She had pain in her left side.  This would hurt when she pressed on that side and when she would take a deep breath.  Not having fever or chest pain at present.  She has been getting sinus congestion with post nasal drip.  Feeling more short of breath with activity.  Has some cough and wheeze.  Using albuterol several times per day and this helps some.  She can't take prednisone because of her diabetes.   ? ?Past Medical History: ?She  has a past medical history of Asthma, Benign neoplasm of colon, Breast cancer ([place]) (08/2018), Cervical disc disease, Chronic diastolic CHF (congestive heart failure) ([place]), CKD (chronic kidney disease), stage III ([place]), COPD (chronic obstructive pulmonary disease) ([place]), Coronary artery disease, Diabetes mellitus without complication ([place]), Fusion of lumbar spine (12/22/2015), GERD (gastroesophageal reflux disease), History of DVT (deep vein thrombosis), History of tobacco abuse, Hyperlipidemia, Hypertension, Hypothyroidism, Lung cancer ([place]), Obesity, Palpitations, Personal history of radiation therapy, PONV (postoperative nausea and vomiting), Pulmonary embolus ([place]) (12/25/2013), PVD (peripheral vascular disease) ([place]), and Stroke ([place]) (2015). ? ?Vital signs: ?BP 108/64 (BP Location: Right Arm, Cuff Size: Large)   Pulse 73   Temp 97.7 ?F (36.5 ?C) (Temporal)   Ht 5' 6"  (1.676 m)   Wt 218 lb (98.9 kg)   SpO2 93%   BMI 35.19 kg/m?  ? ?Physical exam: ? ?Appearance - well kempt  ? ?ENMT - no sinus tenderness, no oral exudate, no LAN, Mallampati 3  airway, no stridor ? ?Respiratory - equal breath sounds bilaterally, no wheezing or rales ? ?CV - s1s2 regular rate and rhythm, no murmurs ? ?Ext - no clubbing, no edema ? ?Skin - no rashes ? ?Psych - normal mood and affect ? ?Studies: ?Spirometry 07/18/16 >> FEV1 1.00 (50%), FEV1% 44 ?Echo 09/03/19 >> EF 55 to 60%, grade 1 DD ?CT chest 06/15/21 >> atherosclerosis, centrilobular emphysema, plate-like consolidation LUL from XRT, 2 mm nodule LUL, basilar GGO and subpleural reticulation improved ? ?Assessment/plan: ? ?Dyspnea. ?- likely from mild COPD exacerbation in setting of allergic rhinitis ?- she has been on antibiotics twice recently for UTI; don't think she needs additional antibiotics ?- discussed trial of prednisone, but she is concerned about how this could impact her diabetes control and opted against prednisone at this time ?- will have her try flonase and astepro ?- continue trelegy and singulair ?- will arrange for home nebulizer ?- prn albuterol ? ?Left sided chest pain. ?- seems to be musculoskeletal ?- she has imaging of her breast scheduled through her PCP ? ? ?Patient Instructions  ?Astepro 1 spray in each nostril in the morning and in the evening ? ?Flonase 1 spray in each nostril nightly ? ?Follow up in 4 weeks with Dr. Patsey [name] or a Nurse Practitioner ? ?Allergies as of 07/14/2021   ? ?   Reactions  ? Hydrocodone Other (See Comments)  ? Unresponsive  ? Aleve [naproxen Sodium]   ? Hives & itching   ? Contrast Media [  iodinated Contrast Media]   ? Pt avoids due to kidney issues  ? Ioxaglate Other (See Comments)  ? (iodine) ?Pt avoids due to kidney issues  ? Oxycodone Other (See Comments)  ? hallucinations  ? Ranexa [ranolazine]   ? Sick on stomach  ? Sulfa Antibiotics Other (See Comments)  ? Pt avoids due to kidney issues  ? Tramadol Other (See Comments)  ? nightmares  ? ?  ? ?  ?Medication List  ?  ? ?  ? Accurate as of Jul 14, 2021 12:32 PM. If you have any questions, ask your nurse or doctor.  ?  ?   ? ?  ? ?STOP taking these medications   ? ?cephALEXin 500 MG capsule ?Commonly known as: KEFLEX ?Stopped by: Tammy Mires, MD ?  ?cetirizine 10 MG tablet ?Commonly known as: ZYRTEC ?Stopped by: Tammy Mires, MD ?  ?nitrofurantoin (macrocrystal-monohydrate) 100 MG capsule ?Commonly known as: Macrobid ?Stopped by: Tammy Mires, MD ?  ? ?  ? ?TAKE these medications   ? ?albuterol 108 (90 Base) MCG/ACT inhaler ?Commonly known as: VENTOLIN HFA ?Inhale 2 puffs into the lungs every 6 (six) hours as needed for wheezing or shortness of breath. ?  ?anastrozole 1 MG tablet ?Commonly known as: ARIMIDEX ?TAKE 1 TABLET BY MOUTH DAILY ?  ?apixaban 2.5 MG Tabs tablet ?Commonly known as: Eliquis ?Take 1 tablet (2.5 mg total) by mouth 2 (two) times daily. ?  ?Azelastine HCl 0.15 % Soln ?Commonly known as: Astepro ?Place 1 spray into the nose 2 (two) times daily. ?Started by: Tammy Mires, MD ?  ?AZO BLADDER CONTROL/GO-LESS PO ?Take 1 tablet by mouth in the morning and at bedtime. ?  ?bisoprolol 10 MG tablet ?Commonly known as: ZEBETA ?Take 1 tablet (10 mg total) by mouth 2 (two) times daily. ?  ?blood glucose meter kit and supplies Kit ?Dispense based on patient and insurance preference. Use one time daily as directed, E11.9 ?  ?Calcium 600 + D 600-5 MG-MCG Tabs ?Generic drug: Calcium Carb-Cholecalciferol ?Take 1 tablet by mouth daily. ?  ?cholecalciferol 25 MCG (1000 UNIT) tablet ?Commonly known as: VITAMIN D3 ?Take 1,000 Units by mouth daily. ?  ?CRANBERRY PO ?Take 2 capsules by mouth daily. ?  ?DULoxetine 60 MG capsule ?Commonly known as: CYMBALTA ?Take 2 capsules (120 mg total) by mouth daily. ?  ?fluticasone 50 MCG/ACT nasal spray ?Commonly known as: FLONASE ?Place 1 spray into both nostrils daily. ?Started by: Tammy Mires, MD ?  ?gabapentin 300 MG capsule ?Commonly known as: NEURONTIN ?Take 1 capsule (300 mg total) by mouth 3 (three) times daily. ?  ?glipiZIDE 5 MG tablet ?Commonly known as: GLUCOTROL ?TAKE 1 TABLET BY MOUTH DAILY  BEFORE BREAKFAST ?  ?isosorbide mononitrate 60 MG 24 hr tablet ?Commonly known as: IMDUR ?TAKE ONE TABLET BY MOUTH AT BEDTIME ?  ?montelukast 10 MG tablet ?Commonly known as: SINGULAIR ?TAKE 1 TABLET BY MOUTH AT BEDTIME ?  ?Myrbetriq 50 MG Tb24 tablet ?Generic drug: mirabegron ER ?TAKE 1 TABLET BY MOUTH DAILY ?  ?nitroGLYCERIN 0.4 MG SL tablet ?Commonly known as: NITROSTAT ?Place 1 tablet (0.4 mg total) under the tongue every 5 (five) minutes as needed for chest pain. Total of 3 doses ?  ?pantoprazole 40 MG tablet ?Commonly known as: PROTONIX ?Take 1 tablet (40 mg total) by mouth daily. ?  ?Pen Needles 3/16" 31G X 5 MM Misc ?1 each by Does not apply route daily. ?  ?PROBIOTIC ACIDOPHILUS PO ?Take 1 capsule by mouth daily at  12 noon. ?  ?rosuvastatin 40 MG tablet ?Commonly known as: CRESTOR ?TAKE 1 TABLET BY MOUTH DAILY ?  ?senna 8.6 MG Tabs tablet ?Commonly known as: SENOKOT ?Take 8.6 mg by mouth daily as needed for mild constipation. ?  ?Synthroid 150 MCG tablet ?Generic drug: levothyroxine ?TAKE ONE TABLET ON AN EMPTY STOMACH WITHA GLASS OF WATER AT LEAST 30 TO 60 MINUTES BEFORE BREAKFAST ?  ?Trelegy Ellipta 100-62.5-25 MCG/ACT Aepb ?Generic drug: Fluticasone-Umeclidin-Vilant ?Inhale 1 puff into the lungs daily. ?  ?Tyler Aas FlexTouch 100 UNIT/ML FlexTouch Pen ?Generic drug: insulin degludec ?Inject 36 Units into the skin daily. ?  ?trimethoprim 100 MG tablet ?Commonly known as: TRIMPEX ?TAKE ONE TABLET BY MOUTH EVERY DAY ?  ?Trulicity 4.5 GW/1.5VI Sopn ?Generic drug: Dulaglutide ?SMARTSIG:4.5 Milligram(s) SUB-Q Once a Week ?  ?UNABLE TO FIND ?Med Name: Energy, memory and mood enhancer ?  ?ZINC CITRATE PO ?Take 1 tablet by mouth daily. ?  ? ?  ? ? ?Time spent: ?38 minutes ? ?Signature: ?Tammy Mires, MD ?[place] ?Pager - [phone] ?07/14/2021, 12:37 PM ? ? ?

## 2021-07-20 ENCOUNTER — Encounter (INDEPENDENT_AMBULATORY_CARE_PROVIDER_SITE_OTHER): Payer: Medicare Other | Admitting: Ophthalmology

## 2021-07-20 DIAGNOSIS — H43813 Vitreous degeneration, bilateral: Secondary | ICD-10-CM

## 2021-07-20 DIAGNOSIS — H35371 Puckering of macula, right eye: Secondary | ICD-10-CM | POA: Diagnosis not present

## 2021-07-21 ENCOUNTER — Other Ambulatory Visit: Payer: Self-pay | Admitting: Family Medicine

## 2021-07-21 NOTE — Telephone Encounter (Signed)
Requested Prescriptions  ?Pending Prescriptions Disp Refills  ?? gabapentin (NEURONTIN) 300 MG capsule [Pharmacy Med Name: GABAPENTIN 300 MG CAP] 270 capsule 3  ?  Sig: TAKE 1 CAPSULE BY MOUTH THREE TIMES DAILY  ?  ? Neurology: Anticonvulsants - gabapentin Failed - 07/21/2021 11:39 AM  ?  ?  Failed - Cr in normal range and within 360 days  ?  Creatinine  ?Date Value Ref Range Status  ?09/20/2011 1.31 (H) 0.60 - 1.30 mg/dL Final  ? ?Creatinine, Ser  ?Date Value Ref Range Status  ?06/27/2021 1.64 (H) 0.44 - 1.00 mg/dL Final  ?   ?  ?  Passed - Completed PHQ-2 or PHQ-9 in the last 360 days  ?  ?  Passed - Valid encounter within last 12 months  ?  Recent Outpatient Visits   ?      ? 1 week ago Breast pain, left  ? [place], [place] P, DO  ? 3 weeks ago Uncontrolled type 2 diabetes mellitus with hyperglycemia, with long-term current use of insulin ([place])  ? [place], Megan P, DO  ? 1 month ago Uncontrolled type 2 diabetes mellitus with hyperglycemia, with long-term current use of insulin ([place])  ? [place] P, DO  ? 1 month ago Acute respiratory failure with hypoxia ([place])  ? [place], Megan P, DO  ? 4 months ago Uncontrolled type 2 diabetes mellitus with hyperglycemia, with long-term current use of insulin ([place])  ? [place], [place] P, DO  ?  ?  ?Future Appointments   ?        ? In 1 month Wynetta Emery, Barb Merino, DO [facility], [facility]  ? In 1 month Martyn Ehrich, NP [facility] [place]  ? In 5 months  [place], [facility]  ?  ? ?  ?  ?  ?? isosorbide mononitrate (IMDUR) 60 MG 24 hr tablet [Pharmacy Med Name: ISOSORBIDE MONONITRATE ER 60 MG TAB] 90 tablet 1  ?  Sig: TAKE ONE TABLET BY MOUTH AT BEDTIME  ?  ? Cardiovascular:  Nitrates Passed - 07/21/2021 11:39 AM  ?  ?  Passed - Last BP in normal range  ?  BP Readings from Last 1 Encounters:  ?07/14/21 108/64  ?   ?  ?  Passed -  Last Heart Rate in normal range  ?  Pulse Readings from Last 1 Encounters:  ?07/14/21 73  ?   ?  ?  Passed - Valid encounter within last 12 months  ?  Recent Outpatient Visits   ?      ? 1 week ago Breast pain, left  ? [place], [place] P, DO  ? 3 weeks ago Uncontrolled type 2 diabetes mellitus with hyperglycemia, with long-term current use of insulin ([place])  ? [place], Megan P, DO  ? 1 month ago Uncontrolled type 2 diabetes mellitus with hyperglycemia, with long-term current use of insulin ([place])  ? [place] P, DO  ? 1 month ago Acute respiratory failure with hypoxia ([place])  ? [place], Megan P, DO  ? 4 months ago Uncontrolled type 2 diabetes mellitus with hyperglycemia, with long-term current use of insulin ([place])  ? [place], [place] P, DO  ?  ?  ?Future Appointments   ?        ? In 1 month [place], [place],  DO [facility], [facility]  ? In 1 month Martyn Ehrich, NP [facility] [place]  ? In 5 months  [place], [facility]  ?  ? ?  ?  ?  ? ?

## 2021-07-26 DIAGNOSIS — Z853 Personal history of malignant neoplasm of breast: Secondary | ICD-10-CM | POA: Diagnosis not present

## 2021-07-27 ENCOUNTER — Ambulatory Visit
Admission: RE | Admit: 2021-07-27 | Discharge: 2021-07-27 | Disposition: A | Discharge status: Home / Self Care | Payer: Medicare Other | Source: Ambulatory Visit | Attending: Family Medicine | Admitting: Family Medicine

## 2021-07-27 ENCOUNTER — Ambulatory Visit (HOSPITAL_COMMUNITY): Payer: Medicare Other

## 2021-07-27 DIAGNOSIS — N644 Mastodynia: Secondary | ICD-10-CM | POA: Insufficient documentation

## 2021-07-27 DIAGNOSIS — Z853 Personal history of malignant neoplasm of breast: Secondary | ICD-10-CM | POA: Diagnosis not present

## 2021-07-27 DIAGNOSIS — N6489 Other specified disorders of breast: Secondary | ICD-10-CM | POA: Diagnosis not present

## 2021-07-27 DIAGNOSIS — R928 Other abnormal and inconclusive findings on diagnostic imaging of breast: Secondary | ICD-10-CM | POA: Diagnosis not present

## 2021-08-03 ENCOUNTER — Ambulatory Visit (HOSPITAL_COMMUNITY)
Admission: RE | Admit: 2021-08-03 | Discharge: 2021-08-03 | Disposition: A | Discharge status: Home / Self Care | Payer: Medicare Other | Source: Ambulatory Visit | Attending: Urology | Admitting: Urology

## 2021-08-03 DIAGNOSIS — Q6211 Congenital occlusion of ureteropelvic junction: Secondary | ICD-10-CM | POA: Insufficient documentation

## 2021-08-03 DIAGNOSIS — N271 Small kidney, bilateral: Secondary | ICD-10-CM | POA: Diagnosis not present

## 2021-08-03 DIAGNOSIS — N2889 Other specified disorders of kidney and ureter: Secondary | ICD-10-CM | POA: Diagnosis not present

## 2021-08-03 MED ORDER — FUROSEMIDE 10 MG/ML IJ SOLN
INTRAMUSCULAR | Status: AC
  Filled 2021-08-03: qty 8

## 2021-08-03 MED ORDER — FUROSEMIDE 10 MG/ML IJ SOLN
50.0000 mg | Freq: Once | INTRAMUSCULAR | Status: AC
  Administered 2021-08-03: 50 mg via INTRAVENOUS

## 2021-08-03 MED ORDER — TECHNETIUM TC 99M MERTIATIDE
5.1300 | Freq: Once | INTRAVENOUS | Status: AC | PRN
  Administered 2021-08-03: 5.13 via INTRAVENOUS

## 2021-08-04 ENCOUNTER — Other Ambulatory Visit: Payer: Self-pay | Admitting: Family Medicine

## 2021-08-08 NOTE — Telephone Encounter (Signed)
Requested Prescriptions  Pending Prescriptions Disp Refills  . montelukast (SINGULAIR) 10 MG tablet [Pharmacy Med Name: MONTELUKAST SODIUM 10 MG TAB] 90 tablet 0    Sig: TAKE 1 TABLET BY MOUTH AT BEDTIME     Pulmonology:  Leukotriene Inhibitors Passed - 08/04/2021 12:29 PM      Passed - Valid encounter within last 12 months    Recent Outpatient Visits          3 weeks ago Breast pain, left   [place], Megan P, DO   1 month ago Uncontrolled type 2 diabetes mellitus with hyperglycemia, with long-term current use of insulin ([place])   [place], Megan P, DO   1 month ago Uncontrolled type 2 diabetes mellitus with hyperglycemia, with long-term current use of insulin ([place])   [place], Megan P, DO   1 month ago Acute respiratory failure with hypoxia ([place])   [place], Megan P, DO   4 months ago Uncontrolled type 2 diabetes mellitus with hyperglycemia, with long-term current use of insulin [facility])   [place], [place], DO      Future Appointments            In 1 month Wynetta Emery, Barb Merino, DO [facility], [place]   In 1 month Volanda Napoleon, Ree Shay, NP [facility] [place]   In 4 months  [facility]

## 2021-08-16 ENCOUNTER — Telehealth: Payer: Medicare Other

## 2021-08-16 ENCOUNTER — Ambulatory Visit (INDEPENDENT_AMBULATORY_CARE_PROVIDER_SITE_OTHER): Payer: Medicare Other

## 2021-08-16 DIAGNOSIS — N3000 Acute cystitis without hematuria: Secondary | ICD-10-CM

## 2021-08-16 DIAGNOSIS — I10 Essential (primary) hypertension: Secondary | ICD-10-CM

## 2021-08-16 DIAGNOSIS — M546 Pain in thoracic spine: Secondary | ICD-10-CM

## 2021-08-16 DIAGNOSIS — E1165 Type 2 diabetes mellitus with hyperglycemia: Secondary | ICD-10-CM

## 2021-08-16 DIAGNOSIS — J449 Chronic obstructive pulmonary disease, unspecified: Secondary | ICD-10-CM

## 2021-08-16 DIAGNOSIS — N644 Mastodynia: Secondary | ICD-10-CM

## 2021-08-16 DIAGNOSIS — E782 Mixed hyperlipidemia: Secondary | ICD-10-CM

## 2021-08-16 DIAGNOSIS — I25118 Atherosclerotic heart disease of native coronary artery with other forms of angina pectoris: Secondary | ICD-10-CM

## 2021-08-16 NOTE — Chronic Care Management (AMB) (Signed)
Chronic Care Management   CCM RN Visit Note  08/16/2021 Name: Tammy [name] MRN: [id number] DOB: 1946-01-06  Subjective: Tammy [name] is a 76 y.o. year old female who is a primary care patient of [name], DO. The care management team was consulted for assistance with disease management and care coordination needs.    Engaged with patient by telephone for follow up visit in response to provider referral for case management and/or care coordination services.   Consent to Services:  The patient was given information about Chronic Care Management services, agreed to services, and gave verbal consent prior to initiation of services.  Please see initial visit note for detailed documentation.   Patient agreed to services and verbal consent obtained.   Assessment: Review of patient past medical history, allergies, medications, health status, including review of consultants reports, laboratory and other test data, was performed as part of comprehensive evaluation and provision of chronic care management services.   SDOH (Social Determinants of Health) assessments and interventions performed:    CCM Care Plan  Allergies  Allergen Reactions   Hydrocodone Other (See Comments)    Unresponsive   Aleve [Naproxen Sodium]     Hives & itching    Contrast Media [Iodinated Contrast Media]     Pt avoids due to kidney issues   Ioxaglate Other (See Comments)    (iodine) Pt avoids due to kidney issues    Oxycodone Other (See Comments)    hallucinations   Ranexa [Ranolazine]     Sick on stomach   Sulfa Antibiotics Other (See Comments)    Pt avoids due to kidney issues   Tramadol Other (See Comments)    nightmares    Outpatient Encounter Medications as of 08/16/2021  Medication Sig   albuterol (PROVENTIL) (2.5 MG/3ML) 0.083% nebulizer solution Take 3 mLs (2.5 mg total) by nebulization every 6 (six) hours as needed for wheezing or shortness of breath.   albuterol (VENTOLIN HFA) 108 (90 Base)  MCG/ACT inhaler Inhale 2 puffs into the lungs every 6 (six) hours as needed for wheezing or shortness of breath.   anastrozole (ARIMIDEX) 1 MG tablet TAKE 1 TABLET BY MOUTH DAILY   apixaban (ELIQUIS) 2.5 MG TABS tablet Take 1 tablet (2.5 mg total) by mouth 2 (two) times daily.   Azelastine HCl (ASTEPRO) 0.15 % SOLN Place 1 spray into the nose 2 (two) times daily.   bisoprolol (ZEBETA) 10 MG tablet Take 1 tablet (10 mg total) by mouth 2 (two) times daily.   blood glucose meter kit and supplies KIT Dispense based on patient and insurance preference. Use one time daily as directed, E11.9   Calcium Carb-Cholecalciferol (CALCIUM 600 + D) 600-200 MG-UNIT TABS Take 1 tablet by mouth daily.    cholecalciferol (VITAMIN D3) 25 MCG (1000 UT) tablet Take 1,000 Units by mouth daily.   CRANBERRY PO Take 2 capsules by mouth daily.   DULoxetine (CYMBALTA) 60 MG capsule Take 2 capsules (120 mg total) by mouth daily.   fluticasone (FLONASE) 50 MCG/ACT nasal spray Place 1 spray into both nostrils daily.   gabapentin (NEURONTIN) 300 MG capsule TAKE 1 CAPSULE BY MOUTH THREE TIMES DAILY   glipiZIDE (GLUCOTROL) 5 MG tablet TAKE 1 TABLET BY MOUTH DAILY BEFORE BREAKFAST   insulin degludec (TRESIBA FLEXTOUCH) 100 UNIT/ML FlexTouch Pen Inject 36 Units into the skin daily.   Insulin Pen Needle (PEN NEEDLES 3/16") 31G X 5 MM MISC 1 each by Does not apply route daily.   isosorbide  mononitrate (IMDUR) 60 MG 24 hr tablet TAKE ONE TABLET BY MOUTH AT BEDTIME   Lactobacillus (PROBIOTIC ACIDOPHILUS PO) Take 1 capsule by mouth daily at 12 noon.   montelukast (SINGULAIR) 10 MG tablet TAKE 1 TABLET BY MOUTH AT BEDTIME   MYRBETRIQ 50 MG TB24 tablet TAKE 1 TABLET BY MOUTH DAILY   nitroGLYCERIN (NITROSTAT) 0.4 MG SL tablet Place 1 tablet (0.4 mg total) under the tongue every 5 (five) minutes as needed for chest pain. Total of 3 doses   pantoprazole (PROTONIX) 40 MG tablet Take 1 tablet (40 mg total) by mouth daily.   Pumpkin Seed-Soy  Germ (AZO BLADDER CONTROL/GO-LESS PO) Take 1 tablet by mouth in the morning and at bedtime.   rosuvastatin (CRESTOR) 40 MG tablet TAKE 1 TABLET BY MOUTH DAILY   senna (SENOKOT) 8.6 MG TABS tablet Take 8.6 mg by mouth daily as needed for mild constipation.   SYNTHROID 150 MCG tablet TAKE ONE TABLET ON AN EMPTY STOMACH WITHA GLASS OF WATER AT LEAST 30 TO 60 MINUTES BEFORE BREAKFAST   TRELEGY ELLIPTA 100-62.5-25 MCG/ACT AEPB Inhale 1 puff into the lungs daily.   trimethoprim (TRIMPEX) 100 MG tablet TAKE ONE TABLET BY MOUTH EVERY DAY   TRULICITY 4.5 ZO/1.0RU SOPN SMARTSIG:4.5 Milligram(s) SUB-Q Once a Week   UNABLE TO FIND Med Name: Energy, memory and mood enhancer   ZINC CITRATE PO Take 1 tablet by mouth daily.   No facility-administered encounter medications on file as of 08/16/2021.    Patient Active Problem List   Diagnosis Date Noted   Acute respiratory failure with hypoxia ([place]) 06/09/2021   Chronic anticoagulation 06/09/2021   Elevated lipase 06/09/2021   Stage 3b chronic kidney disease ([place]) 06/08/2021   Intermittent claudication ([place]) 02/15/2021   Cervical radiculopathy 10/14/2020   Sacroiliac inflammation ([place]) 10/14/2020   Lung nodule 08/19/2020   Fall 04/17/2020   Anemia 02/16/2020   Asthma 02/16/2020   History of cardiac catheterization 02/16/2020   History of left mastectomy 02/16/2020   Malignant tumor of breast ([place]) 02/16/2020   History of total knee arthroplasty 11/18/2019   Unstable angina ([place]) 04/03/2019   Dysphagia    Thickening of esophagus    Stomach irritation    Gastric polyp    Positive occult stool blood test    Polyp of colon    Hyperkalemia 11/27/2018   Proteinuria 11/27/2018   Type 2 diabetes mellitus with stage 3 chronic kidney disease ([place]) 11/27/2018   Carcinoma of lower-inner quadrant of left breast in female, estrogen receptor positive ([place]) 10/02/2018   Right knee pain 08/01/2017   Chronic right-sided low back pain with right-sided sciatica  05/28/2017   Gait abnormality 05/28/2017   Hypothyroid 07/18/2016   COPD (chronic obstructive pulmonary disease) ([place]) 07/18/2016   Encephalomalacia on imaging study 12/10/2015   Calcification of both carotid arteries 12/10/2015   Benign neoplasm of sigmoid colon    Benign neoplasm of cecum    Benign neoplasm of ascending colon    Bilateral atelectasis 04/11/2015   Fatty liver disease, nonalcoholic 04/54/0981   Hydronephrosis of right kidney 04/11/2015   Abdominal aortic ectasia ([place]) 04/11/2015   Constipation 04/07/2015   Abdominal aortic atherosclerosis ([place]) 12/18/2014   Diverticulosis of colon 12/18/2014   Chest pain 11/18/2014   Rhinitis, allergic 10/20/2014   Coronary artery disease    Essential hypertension    Morbid obesity due to excess calories ([place])    Spinal stenosis of lumbar region 06/06/2014   Herniated nucleus pulposus 06/06/2014  Neuralgia neuritis, sciatic nerve 05/24/2014   Pulmonary embolism ([place]) 01/30/2014   Benign hypertensive renal disease 12/16/2013   Uncontrolled type 2 diabetes mellitus with hyperglycemia, with long-term current use of insulin ([place]) 12/16/2013   Chronic kidney disease 12/16/2013   Urinary tract infection 11/03/2013   Overactive bladder 11/03/2013   Coronary atherosclerosis of native coronary artery 06/19/2013   History of back surgery 06/19/2013   Hyperlipidemia 06/19/2013   PAD (peripheral artery disease) ([place]) 06/19/2013   Congenital obstruction of ureteropelvic junction 11/21/2011    Conditions to be addressed/monitored:CAD, HTN, HLD, COPD, DMII, and chronic pain, recurrent UTI's  Care Plan : RNCM: General Plan of Care (Adult) for Chronic Disease Management and Care Coordination Needs  Updates made by [name], RN since 08/16/2021 12:00 AM     Problem: RNCM; Development of Plan of Care for Chronic Disease Management (DM, CAD, COPD, HTN, HLD, chronic pain)   Priority: High     Long-Range Goal: RNCM: Effective Management  of Plan of Care for Chronic Disease Management (DM, CAD, COPD, HTN, HLD, chronic pain)   Start Date: 02/22/2021  Expected End Date: 02/22/2022  Priority: High  Note:   Current Barriers:  Knowledge Deficits related to plan of care for management of CAD, HTN, HLD, COPD, DMII, and chronic pain   Chronic Disease Management support and education needs related to CAD, HTN, HLD, COPD, DMII, and Chronic pain   RNCM Clinical Goal(s):  Patient will verbalize basic understanding of CAD, HTN, HLD, COPD, DMII, and Chronic pain  disease process and self health management plan as evidenced by keeping appointments, taking medications, following dietary restrictions, calling the office for changes in conditions, questions or concerns take all medications exactly as prescribed and will call provider for medication related questions as evidenced by compliance with medications, and calling before needed refills     attend all scheduled medical appointments: 09-07-2021 at 10 am as evidenced by keeping appointments and calling for schedule change needs         demonstrate improved and ongoing adherence to prescribed treatment plan for CAD, HTN, HLD, COPD, DMII, and Chronic pain  as evidenced by stable conditions and working with the CCM team to effectively manage chronic conditions  demonstrate a decrease in CAD, HTN, HLD, COPD, DMII, and Chronic pain  exacerbations  as evidenced by following the plan of care and keeping the pcp and CCM team updated to changes in condition demonstrate ongoing self health care management ability to effectively manage chronic conditions  as evidenced by working with the CCM team  through collaboration with Consulting civil engineer, provider, and care team.   Interventions: 1:1 collaboration with primary care provider regarding development and update of comprehensive plan of care as evidenced by provider attestation and co-signature Inter-disciplinary care team collaboration (see longitudinal plan  of care) Evaluation of current treatment plan related to  self management and patient's adherence to plan as established by provider   CAD  (Status: Goal on Track (progressing): YES.) Long Term Goal  BP Readings from Last 3 Encounters:  07/14/21 108/64  06/27/21 124/62  06/22/21 130/67    Lab Results  Component Value Date   CHOL 161 02/15/2021   HDL 49 02/15/2021   LDLCALC 78 02/15/2021   TRIG 204 (H) 02/15/2021   CHOLHDL 3.2 09/07/2015    Assessed understanding of CAD diagnosis. 08-16-2021: The patient has a good understanding of her cardiac issues and CAD. Sees specialist on a regular basis. Medications reviewed including medications utilized  in CAD treatment plan. 08-16-2021: The patient is compliant with medications Provided education on importance of blood pressure control in management of CAD. 06-28-2021: The patient has better  control of her blood pressures. Did have issues at the end of March with elevations and chest pain. Is having a stress test tomorrow for further evaluation of recent onset of chest pain and discomfort. 08-16-2021: The patient is having better control of her blood pressures. She is mindful of changes and keeps close watch on her blood pressures.; Provided education on Importance of limiting foods high in cholesterol. 08-16-2021: The patient states that she is compliant with heart healthy/ADA diet ; Counseled on importance of regular laboratory monitoring as prescribed. 08-16-2021: The patient is compliant with regular lab work.  Counseled on the importance of exercise goals with target of 150 minutes per week. 08-16-2021: The patient is not able to do a lot of activity due to her chronic back pain. Reviewed Importance of taking all medications as prescribed. 08-16-2021: The patient takes her medications as prescribed. Reviewed Importance of attending all scheduled provider appointments. 08-16-2021: Reminder given of upcoming appointment with Dr. Wynetta Emery on 09-07-2021 at 10  am Advised to report any changes in symptoms or exercise tolerance. 08-16-2021: unable to tolerate doing activity  COPD: (Status: Goal on Track (progressing): YES.) Long Term Goal  Reviewed medications with patient, including use of prescribed maintenance and rescue inhalers, and provided instruction on medication management and the importance of adherence. 08-16-2021: The patient is compliant with her medications  Provided patient with basic written and verbal COPD education on self care/management/and exacerbation prevention Advised patient to track and manage COPD triggers. 08-16-2021: The patient aware of things that trigger her COPD.  Provided written and verbal instructions on pursed lip breathing and utilized returned demonstration as teach back Provided instruction about proper use of medications used for management of COPD including inhalers Advised patient to self assesses COPD action plan zone and make appointment with provider if in the yellow zone for 48 hours without improvement. 04-26-2021: The patient states she needs to get a follow up appointment with the pulmonary provider. She should have already gotten one but she hasn't. She has had several appointments. She states her breathing is not all that good but it is stable. Denies any acute findings related to pulmonary conditions. 06-28-2021: The patient was in the ER with several concerns the end of March. Saturations were low and blood sugars elevated. The patient was having pain and discomfort in her chest. Denies any issues with breathing at this time. Has had follow up appointment with her specialist and pcp on Thursday. 08-16-2021: The patient saw pulmonary provider recently and lungs were clear. The patient states that her COPD is stable at this time. She is having other concerns at this time. She knows what to look for with changes of her COPD.  Advised patient to engage in light exercise as tolerated 3-5 days a week to aid in the the  management of COPD Provided education about and advised patient to utilize infection prevention strategies to reduce risk of respiratory infection. 08-16-2021: The patient denies any issues with infection control. The patient is mindful of others who are sick and monitoring weather changes.  Discussed the importance of adequate rest and management of fatigue with COPD. 06-28-2021: The patient is having an issue with feeling "tired all the time". The patient states she just doesn't feel like doing a lot of things but pushes herself to do so. Education and  support. Has upcoming appointments to discuss new concerns.  Screening for signs and symptoms of depression related to chronic disease state  Assessed social determinant of health barriers  Diabetes:  (Status: Goal on Track (progressing): YES.) Long Term Goal   Lab Results  Component Value Date   HGBA1C 9.6 (H) 06/08/2021  Previous 8.0 Assessed patient's understanding of A1c goal: <7%. 08-16-2021: The patient knows the goal of A1C is less than <7.0. Has had an exacerbation of her blood sugar readings recently.  Provided education to patient about basic DM disease process. 08-16-2021: Review with the patient and education provided.; Reviewed medications with patient and discussed importance of medication adherence. 08-16-2021: The patient is compliant with medications. Has been followed closely by pcp due to recent elevations in blood sugars and changes in medications. ;        Reviewed prescribed diet with patient heart healthy/ADA diet. 04-26-2021: The patient often skips meals and does not eat lunch. The patient states that she doesn't eat a lot. Discussed eating a light snack when she doesn't want to eat meals. 08-16-2021: The patient is eating well and denies any acute findings today. Counseled on importance of regular laboratory monitoring as prescribed. 08-16-2021: The patient has regular lab testing        Discussed plans with patient for ongoing care  management follow up and provided patient with direct contact information for care management team;      Provided patient with written educational materials related to hypo and hyperglycemia and importance of correct treatment. 02-22-2021: The patient had blood sugar up to 506 after taking prednisone for headache pain. The patient has completed prednisone and is having better blood sugars. This am her blood sugar was 161, and yesterday it was 114. 04-26-2021: States that her blood sugars are "all over the place" and most of the time under 200 but she would like to see them lower. Denies any real high elevations of blood sugars.  06-28-2021: The patient states that she has been having elevated blood sugars. They are better and coming down and around 130's. A few weeks ago when in the ER her blood sugars were greater than 400. Other readings around 347, 365, and 235. Education and support provided. Has a follow up with the pcp on 06-30-2021 for evaluation further and recommendations.  08-16-2021: The patient denies any real highs or real lows. Knows how to effectively manage highs and lows.  Reviewed scheduled/upcoming provider appointments including: 09-07-2021 at 10 am ;         Advised patient, providing education and rationale, to check cbg before meals and at bedtime, when you have symptoms of low or high blood sugar, and before and after exercise and record. 02-22-2021: The patient has been checking blood sugars regularly. Has been having ups and downs due to prednisone for temporal headache pain. The patient states that she is better today and sees the specialist tomorrow. The patient denies any acute findings with DM at this time. 04-26-2021: The patient is having up and down readings. States that her blood sugars are staying below <200. Reviewed with the patient the goal of fasting blood sugars of <130 and post prandial of <180. Education and support given. Will continue to monitor.  06-28-2021: Has been having  fluctuations from 130's to >400. Being followed closely by pcp. 08-16-2021: The patient states that she is having better control of her blood sugars. Range has been 123 to 157.       call provider  for findings outside established parameters. 08-16-2021: Education and support given to the patient. Review of calling the provider for blood sugars consistently <70 and > 200's;       Review of patient status, including review of consultants reports, relevant laboratory and other test results, and medications completed;       Advised patient to discuss Jardiance concern  with provider.  She was told by her nephrologist that [name] was not good for her kidneys. Advised the patient to discuss with pcp next week at her appointment.       Eye exam up to date. States she needs to have cataract surgery but has had issues with the eye doctor so she is planning on going to see a new one. (04-26-2021)  Hyperlipidemia:  (Status: Goal on Track (progressing): YES.) Long Term Goal  Lab Results  Component Value Date   CHOL 161 02/15/2021   HDL 49 02/15/2021   LDLCALC 78 02/15/2021   TRIG 204 (H) 02/15/2021   CHOLHDL 3.2 09/07/2015     Medication review performed; medication list updated in electronic medical record. 08-16-2021: is compliant with Rosuvastatin 40 mg QD Provider established cholesterol goals reviewed. 08-16-2021: The patient has good control of her cholesterol levels.  Counseled on importance of regular laboratory monitoring as prescribed. 08-16-2021: Has regular testing and is compliant with lab work; Provided HLD Scientist, clinical (histocompatibility and immunogenetics); Reviewed role and benefits of statin for ASCVD risk reduction; Discussed strategies to manage statin-induced myalgias; Reviewed importance of limiting foods high in cholesterol. 08-16-2021: The patient is mindful of heart healthy/ADA diet; Reviewed exercise goals and target of 150 minutes per week;  Hypertension: (Status: Goal on Track (progressing): YES.) Last practice  recorded BP readings:  BP Readings from Last 3 Encounters:  07/14/21 108/64  06/27/21 124/62  06/22/21 130/67  Most recent eGFR/CrCl:  Lab Results  Component Value Date   EGFR 34 (L) 03/23/2021    No components found for: CRCL  Evaluation of current treatment plan related to hypertension self management and patient's adherence to plan as established by provider. 04-26-2021: The patient has good control of her blood pressures at this time. Denies any issues with HTN or heart health. 06-28-2021: The patient states that she has been having some fluctuations in her blood pressures along with new onset of chest pain. She had a stress test last week but could not complete the 3rd step due to the machine malfunctioning. She has to go back tomorrow to do a repeat stress test. She sates that she hates that she has to go through this again but it was not their fault that the machine malfunctioned. She states she has NTG to take for CP and did on 06-08-2021 when she went to her appointment with the kidney specialist and they called 911. She states that since then she has been having intermittent CP off and on. Was evaluated in the ER and had low oxygen saturation and elevated blood sugars. Education on making sure NTG was current and not expired. Review of sx and sx to monitor. Will continue to monitor for changes. 08-16-2021: The patient is doing better with blood pressure control. States that she is having no issues at this time with her blood pressures. Will continue to monitor.;   Provided education to patient re: stroke prevention, s/s of heart attack and stroke. 06-28-2021; Reviewed prescribed diet heart healthy/ADA diet. 08-16-2021: Review and education provided  Reviewed medications with patient and discussed importance of compliance. 08-16-2021: The patient is compliant with  medications;  Counseled on adverse effects of illicit drug and excessive alcohol use in patients with high blood pressure;  Counseled on  the importance of exercise goals with target of 150 minutes per week Discussed plans with patient for ongoing care management follow up and provided patient with direct contact information for care management team; Advised patient, providing education and rationale, to monitor blood pressure daily and record, calling PCP for findings outside established parameters;  Provided education on prescribed diet heart healthy/ADA. 08-16-2021: Is compliant with dietary restrictions  Discussed complications of poorly controlled blood pressure such as heart disease, stroke, circulatory complications, vision complications, kidney impairment, sexual dysfunction;   Pain:  (Status: Goal on Track (progressing): YES.) Long Term Goal  Pain assessment performed. 02-22-2021: The patient rates her head/temporal pain at 3 to 4 today. Has chronic back and knee pain also. The patient denies any acute findings. Is going to specialist tomorrow to have further evaluation of temporal pain that has been going on for about 2 weeks in addition to other chronic pain areas. 04-26-2021: The patient is having back pain when she walks at a 10. Today she is even having it when she sits down. The patient states that she is also having stomach pain and rates it at a 7 today on a scale of 0-10. She states she was doing PT to help with ambulation and back pain but it was only making it worse. She does not think she is going back. Education and support given. 06-28-2021: The patient states that her pain today is at a 2 on a scale of 0-10 in her left breast arm area. She has been having shooting pains in her breast and arm and will have a mammogram on 07-18-2021. Education and support given. 08-16-2021: The patient is having back pain and now is having pain in the back of her left breast coming around to the nipple. She saw general surgeon and had a mammogram with no acute findings. She rates this pain at a 6 today. She states that something has to be going on  because sometimes the pain shoots through her left nipple. Education on writing down questions to ask the pcp at upcoming visit. The patient states she may need to come in sooner if the pain gets worse. Education and support given.  Medications reviewed. 08-16-2021: Education and patient is compliant with medications Reviewed provider established plan for pain management. 08-16-2021: The patient is taking Tylenol and using Voltaren gel with a heating pad with some relief. She states that she is having the pain wake her up from her sleep. Education and support given.  Discussed importance of adherence to all scheduled medical appointments. 08-16-2021: sees the pcp on 09-07-2021 at 10 am Counseled on the importance of reporting any/all new or changed pain symptoms or management strategies to pain management provider; Advised patient to report to care team affect of pain on daily activities; Discussed use of relaxation techniques and/or diversional activities to assist with pain reduction (distraction, imagery, relaxation, massage, acupressure, TENS, heat, and cold application; Reviewed with patient prescribed pharmacological and nonpharmacological pain relief strategies; Advised patient to discuss unresolved pain, changes in level or intensity of pain  with provider;   Recurrent UTI's  (Status: Goal on Track (progressing): YES.) Long Term Goal  Evaluation of current treatment plan related to  recurrent UTI's ,  self-management and patient's adherence to plan as established by provider. 08-16-2021: Is stable at this time. Knows when to call the provider. Has  been taking antibiotics on a regular basis.  Discussed plans with patient for ongoing care management follow up and provided patient with direct contact information for care management team Advised patient to write down questions to ask the urologist on her appointment for Thursday 06-30-2021. The patient is concerned that she is having so many issues with  recurrent UTI's. The patient states the urologist told her that her right kidney may need to be removed.; Provided education to patient re: sx and sx of UTI, good hygiene, taking medications as ordered, discussing concerns about her urinary health with provider and working with the CCM team for ongoing support and education for urinary health and other chronic conditions. ; Provided patient with UTI and kidney health educational materials related to recurrent UTI's and questions about nephrectomy through the my chart system; Reviewed scheduled/upcoming provider appointments including appointment with the pcp on 09-07-2021 at 10 am; Discussed plans with patient for ongoing care management follow up and provided patient with direct contact information for care management team; Advised patient to discuss her fears, questions and concerns about recurrent UTI's and the possible removal of her right kidney with provider; Screening for signs and symptoms of depression related to chronic disease state;  Assessed social determinant of health barriers;    Patient Goals/Self-Care Activities: Take medications as prescribed   Attend all scheduled provider appointments Call pharmacy for medication refills 3-7 days in advance of running out of medications schedule appointment with eye doctor check blood sugar at prescribed times: before meals and at bedtime, when you have symptoms of low or high blood sugar, and before and after exercise check feet daily for cuts, sores or redness enter blood sugar readings and medication or insulin into daily log take the blood sugar log to all doctor visits take the blood sugar meter to all doctor visits trim toenails straight across drink 6 to 8 glasses of water each day eat fish at least once per week fill half of plate with vegetables manage portion size keep feet up while sitting wash and dry feet carefully every day wear comfortable, cotton socks wear comfortable,  well-fitting shoes - avoid second hand smoke - eliminate smoking in my home - identify and avoid work-related triggers - identify and remove indoor air pollutants - limit outdoor activity during cold weather - listen for public air quality announcements every day - do breathing exercises every day - eliminate symptom triggers at home - follow rescue plan if symptoms flare-up - use an extra pillow to sleep - develop a new routine to improve sleep - don't eat or exercise right before bedtime - eat healthy/prescribed diet: heart healthy/ADA - get at least 7 to 8 hours of sleep at night - use devices that will help like a cane, sock-puller or reacher - practice relaxation or meditation daily - do exercises in a comfortable position that makes breathing as easy as possible check blood pressure daily choose a place to take my blood pressure (home, clinic or office, retail store) write blood pressure results in a log or diary learn about high blood pressure keep a blood pressure log take blood pressure log to all doctor appointments call doctor for signs and symptoms of high blood pressure develop an action plan for high blood pressure keep all doctor appointments take medications for blood pressure exactly as prescribed begin an exercise program report new symptoms to your doctor eat more whole grains, fruits and vegetables, lean meats and healthy fats - call for medicine refill  2 or 3 days before it runs out - take all medications exactly as prescribed - call doctor with any symptoms you believe are related to your medicine - call doctor when you experience any new symptoms - go to all doctor appointments as scheduled       Plan:Telephone follow up appointment with care management team member scheduled for:  10-26-2021 at 45 am  [place], MSN, [facility] Mobile: [phone]

## 2021-08-16 NOTE — Patient Instructions (Signed)
Visit Information  Thank you for taking time to visit with me today. Please don't hesitate to contact me if I can be of assistance to you before our next scheduled telephone appointment.  Following are the goals we discussed today:  CAD  (Status: Goal on Track (progressing): YES.) Long Term Goal     BP Readings from Last 3 Encounters:  07/14/21 108/64  06/27/21 124/62  06/22/21 130/67         Lab Results  Component Value Date    CHOL 161 02/15/2021    HDL 49 02/15/2021    LDLCALC 78 02/15/2021    TRIG 204 (H) 02/15/2021    CHOLHDL 3.2 09/07/2015    Assessed understanding of CAD diagnosis. 08-16-2021: The patient has a good understanding of her cardiac issues and CAD. Sees specialist on a regular basis. Medications reviewed including medications utilized in CAD treatment plan. 08-16-2021: The patient is compliant with medications Provided education on importance of blood pressure control in management of CAD. 06-28-2021: The patient has better  control of her blood pressures. Did have issues at the end of March with elevations and chest pain. Is having a stress test tomorrow for further evaluation of recent onset of chest pain and discomfort. 08-16-2021: The patient is having better control of her blood pressures. She is mindful of changes and keeps close watch on her blood pressures.; Provided education on Importance of limiting foods high in cholesterol. 08-16-2021: The patient states that she is compliant with heart healthy/ADA diet ; Counseled on importance of regular laboratory monitoring as prescribed. 08-16-2021: The patient is compliant with regular lab work.  Counseled on the importance of exercise goals with target of 150 minutes per week. 08-16-2021: The patient is not able to do a lot of activity due to her chronic back pain. Reviewed Importance of taking all medications as prescribed. 08-16-2021: The patient takes her medications as prescribed. Reviewed Importance of attending all scheduled  provider appointments. 08-16-2021: Reminder given of upcoming appointment with Dr. Wynetta Emery on 09-07-2021 at 10 am Advised to report any changes in symptoms or exercise tolerance. 08-16-2021: unable to tolerate doing activity   COPD: (Status: Goal on Track (progressing): YES.) Long Term Goal  Reviewed medications with patient, including use of prescribed maintenance and rescue inhalers, and provided instruction on medication management and the importance of adherence. 08-16-2021: The patient is compliant with her medications  Provided patient with basic written and verbal COPD education on self care/management/and exacerbation prevention Advised patient to track and manage COPD triggers. 08-16-2021: The patient aware of things that trigger her COPD.  Provided written and verbal instructions on pursed lip breathing and utilized returned demonstration as teach back Provided instruction about proper use of medications used for management of COPD including inhalers Advised patient to self assesses COPD action plan zone and make appointment with provider if in the yellow zone for 48 hours without improvement. 04-26-2021: The patient states she needs to get a follow up appointment with the pulmonary provider. She should have already gotten one but she hasn't. She has had several appointments. She states her breathing is not all that good but it is stable. Denies any acute findings related to pulmonary conditions. 06-28-2021: The patient was in the ER with several concerns the end of March. Saturations were low and blood sugars elevated. The patient was having pain and discomfort in her chest. Denies any issues with breathing at this time. Has had follow up appointment with her specialist and pcp on Thursday.  08-16-2021: The patient saw pulmonary provider recently and lungs were clear. The patient states that her COPD is stable at this time. She is having other concerns at this time. She knows what to look for with changes of  her COPD.  Advised patient to engage in light exercise as tolerated 3-5 days a week to aid in the the management of COPD Provided education about and advised patient to utilize infection prevention strategies to reduce risk of respiratory infection. 08-16-2021: The patient denies any issues with infection control. The patient is mindful of others who are sick and monitoring weather changes.  Discussed the importance of adequate rest and management of fatigue with COPD. 06-28-2021: The patient is having an issue with feeling "tired all the time". The patient states she just doesn't feel like doing a lot of things but pushes herself to do so. Education and support. Has upcoming appointments to discuss new concerns.  Screening for signs and symptoms of depression related to chronic disease state  Assessed social determinant of health barriers   Diabetes:  (Status: Goal on Track (progressing): YES.) Long Term Goal         Lab Results  Component Value Date    HGBA1C 9.6 (H) 06/08/2021  Previous 8.0 Assessed patient's understanding of A1c goal: <7%. 08-16-2021: The patient knows the goal of A1C is less than <7.0. Has had an exacerbation of her blood sugar readings recently.  Provided education to patient about basic DM disease process. 08-16-2021: Review with the patient and education provided.; Reviewed medications with patient and discussed importance of medication adherence. 08-16-2021: The patient is compliant with medications. Has been followed closely by pcp due to recent elevations in blood sugars and changes in medications. ;        Reviewed prescribed diet with patient heart healthy/ADA diet. 04-26-2021: The patient often skips meals and does not eat lunch. The patient states that she doesn't eat a lot. Discussed eating a light snack when she doesn't want to eat meals. 08-16-2021: The patient is eating well and denies any acute findings today. Counseled on importance of regular laboratory monitoring as  prescribed. 08-16-2021: The patient has regular lab testing        Discussed plans with patient for ongoing care management follow up and provided patient with direct contact information for care management team;      Provided patient with written educational materials related to hypo and hyperglycemia and importance of correct treatment. 02-22-2021: The patient had blood sugar up to 506 after taking prednisone for headache pain. The patient has completed prednisone and is having better blood sugars. This am her blood sugar was 161, and yesterday it was 114. 04-26-2021: States that her blood sugars are "all over the place" and most of the time under 200 but she would like to see them lower. Denies any real high elevations of blood sugars.  06-28-2021: The patient states that she has been having elevated blood sugars. They are better and coming down and around 130's. A few weeks ago when in the ER her blood sugars were greater than 400. Other readings around 347, 365, and 235. Education and support provided. Has a follow up with the pcp on 06-30-2021 for evaluation further and recommendations.  08-16-2021: The patient denies any real highs or real lows. Knows how to effectively manage highs and lows.  Reviewed scheduled/upcoming provider appointments including: 09-07-2021 at 10 am ;         Advised patient, providing education and rationale,  to check cbg before meals and at bedtime, when you have symptoms of low or high blood sugar, and before and after exercise and record. 02-22-2021: The patient has been checking blood sugars regularly. Has been having ups and downs due to prednisone for temporal headache pain. The patient states that she is better today and sees the specialist tomorrow. The patient denies any acute findings with DM at this time. 04-26-2021: The patient is having up and down readings. States that her blood sugars are staying below <200. Reviewed with the patient the goal of fasting blood sugars of <130  and post prandial of <180. Education and support given. Will continue to monitor.  06-28-2021: Has been having fluctuations from 130's to >400. Being followed closely by pcp. 08-16-2021: The patient states that she is having better control of her blood sugars. Range has been 123 to 157.       call provider for findings outside established parameters. 08-16-2021: Education and support given to the patient. Review of calling the provider for blood sugars consistently <70 and > 200's;       Review of patient status, including review of consultants reports, relevant laboratory and other test results, and medications completed;       Advised patient to discuss Jardiance concern  with provider.  She was told by her nephrologist that [name] was not good for her kidneys. Advised the patient to discuss with pcp next week at her appointment.       Eye exam up to date. States she needs to have cataract surgery but has had issues with the eye doctor so she is planning on going to see a new one. (04-26-2021)   Hyperlipidemia:  (Status: Goal on Track (progressing): YES.) Long Term Goal       Lab Results  Component Value Date    CHOL 161 02/15/2021    HDL 49 02/15/2021    LDLCALC 78 02/15/2021    TRIG 204 (H) 02/15/2021    CHOLHDL 3.2 09/07/2015      Medication review performed; medication list updated in electronic medical record. 08-16-2021: is compliant with Rosuvastatin 40 mg QD Provider established cholesterol goals reviewed. 08-16-2021: The patient has good control of her cholesterol levels.  Counseled on importance of regular laboratory monitoring as prescribed. 08-16-2021: Has regular testing and is compliant with lab work; Provided HLD Scientist, clinical (histocompatibility and immunogenetics); Reviewed role and benefits of statin for ASCVD risk reduction; Discussed strategies to manage statin-induced myalgias; Reviewed importance of limiting foods high in cholesterol. 08-16-2021: The patient is mindful of heart healthy/ADA diet; Reviewed  exercise goals and target of 150 minutes per week;   Hypertension: (Status: Goal on Track (progressing): YES.) Last practice recorded BP readings:     BP Readings from Last 3 Encounters:  07/14/21 108/64  06/27/21 124/62  06/22/21 130/67  Most recent eGFR/CrCl:       Lab Results  Component Value Date    EGFR 34 (L) 03/23/2021    No components found for: CRCL   Evaluation of current treatment plan related to hypertension self management and patient's adherence to plan as established by provider. 04-26-2021: The patient has good control of her blood pressures at this time. Denies any issues with HTN or heart health. 06-28-2021: The patient states that she has been having some fluctuations in her blood pressures along with new onset of chest pain. She had a stress test last week but could not complete the 3rd step due to the machine malfunctioning. She has to  go back tomorrow to do a repeat stress test. She sates that she hates that she has to go through this again but it was not their fault that the machine malfunctioned. She states she has NTG to take for CP and did on 06-08-2021 when she went to her appointment with the kidney specialist and they called 911. She states that since then she has been having intermittent CP off and on. Was evaluated in the ER and had low oxygen saturation and elevated blood sugars. Education on making sure NTG was current and not expired. Review of sx and sx to monitor. Will continue to monitor for changes. 08-16-2021: The patient is doing better with blood pressure control. States that she is having no issues at this time with her blood pressures. Will continue to monitor.;   Provided education to patient re: stroke prevention, s/s of heart attack and stroke. 06-28-2021; Reviewed prescribed diet heart healthy/ADA diet. 08-16-2021: Review and education provided  Reviewed medications with patient and discussed importance of compliance. 08-16-2021: The patient is compliant with  medications;  Counseled on adverse effects of illicit drug and excessive alcohol use in patients with high blood pressure;  Counseled on the importance of exercise goals with target of 150 minutes per week Discussed plans with patient for ongoing care management follow up and provided patient with direct contact information for care management team; Advised patient, providing education and rationale, to monitor blood pressure daily and record, calling PCP for findings outside established parameters;  Provided education on prescribed diet heart healthy/ADA. 08-16-2021: Is compliant with dietary restrictions  Discussed complications of poorly controlled blood pressure such as heart disease, stroke, circulatory complications, vision complications, kidney impairment, sexual dysfunction;    Pain:  (Status: Goal on Track (progressing): YES.) Long Term Goal  Pain assessment performed. 02-22-2021: The patient rates her head/temporal pain at 3 to 4 today. Has chronic back and knee pain also. The patient denies any acute findings. Is going to specialist tomorrow to have further evaluation of temporal pain that has been going on for about 2 weeks in addition to other chronic pain areas. 04-26-2021: The patient is having back pain when she walks at a 10. Today she is even having it when she sits down. The patient states that she is also having stomach pain and rates it at a 7 today on a scale of 0-10. She states she was doing PT to help with ambulation and back pain but it was only making it worse. She does not think she is going back. Education and support given. 06-28-2021: The patient states that her pain today is at a 2 on a scale of 0-10 in her left breast arm area. She has been having shooting pains in her breast and arm and will have a mammogram on 07-18-2021. Education and support given. 08-16-2021: The patient is having back pain and now is having pain in the back of her left breast coming around to the nipple. She  saw general surgeon and had a mammogram with no acute findings. She rates this pain at a 6 today. She states that something has to be going on because sometimes the pain shoots through her left nipple. Education on writing down questions to ask the pcp at upcoming visit. The patient states she may need to come in sooner if the pain gets worse. Education and support given.  Medications reviewed. 08-16-2021: Education and patient is compliant with medications Reviewed provider established plan for pain management. 08-16-2021: The patient  is taking Tylenol and using Voltaren gel with a heating pad with some relief. She states that she is having the pain wake her up from her sleep. Education and support given.  Discussed importance of adherence to all scheduled medical appointments. 08-16-2021: sees the pcp on 09-07-2021 at 10 am Counseled on the importance of reporting any/all new or changed pain symptoms or management strategies to pain management provider; Advised patient to report to care team affect of pain on daily activities; Discussed use of relaxation techniques and/or diversional activities to assist with pain reduction (distraction, imagery, relaxation, massage, acupressure, TENS, heat, and cold application; Reviewed with patient prescribed pharmacological and nonpharmacological pain relief strategies; Advised patient to discuss unresolved pain, changes in level or intensity of pain  with provider;     Recurrent UTI's  (Status: Goal on Track (progressing): YES.) Long Term Goal  Evaluation of current treatment plan related to  recurrent UTI's ,  self-management and patient's adherence to plan as established by provider. 08-16-2021: Is stable at this time. Knows when to call the provider. Has been taking antibiotics on a regular basis.  Discussed plans with patient for ongoing care management follow up and provided patient with direct contact information for care management team Advised patient to write  down questions to ask the urologist on her appointment for Thursday 06-30-2021. The patient is concerned that she is having so many issues with recurrent UTI's. The patient states the urologist told her that her right kidney may need to be removed.; Provided education to patient re: sx and sx of UTI, good hygiene, taking medications as ordered, discussing concerns about her urinary health with provider and working with the CCM team for ongoing support and education for urinary health and other chronic conditions. ; Provided patient with UTI and kidney health educational materials related to recurrent UTI's and questions about nephrectomy through the my chart system; Reviewed scheduled/upcoming provider appointments including appointment with the pcp on 09-07-2021 at 10 am; Discussed plans with patient for ongoing care management follow up and provided patient with direct contact information for care management team; Advised patient to discuss her fears, questions and concerns about recurrent UTI's and the possible removal of her right kidney with provider; Screening for signs and symptoms of depression related to chronic disease state;  Assessed social determinant of health barriers;    Our next appointment is by telephone on 10-26-2021 at 1030 am  Please call the care guide team at [phone] if you need to cancel or reschedule your appointment.   If you are experiencing a Mental Health or [place] or need someone to talk to, please call the Suicide and Crisis Lifeline: 988 call the [place] National Suicide Prevention Lifeline: (502)297-9925 or TTY: 872-560-6914 TTY (250)887-4007) to talk to a trained counselor call 1-800-273-TALK (toll free, 24 hour hotline)   Patient verbalizes understanding of instructions and care plan provided today and agrees to view in [place]. Active MyChart status and patient understanding of how to access instructions and care plan via MyChart confirmed with  patient.     Noreene Larsson RN, MSN, [facility] Mobile: [phone]

## 2021-08-23 ENCOUNTER — Ambulatory Visit
Admission: RE | Admit: 2021-08-23 | Discharge: 2021-08-23 | Disposition: A | Discharge status: Home / Self Care | Payer: Medicare Other | Attending: Internal Medicine | Admitting: Internal Medicine

## 2021-08-23 ENCOUNTER — Ambulatory Visit
Admission: RE | Admit: 2021-08-23 | Discharge: 2021-08-23 | Disposition: A | Discharge status: Home / Self Care | Payer: Medicare Other | Source: Ambulatory Visit | Attending: Internal Medicine | Admitting: Internal Medicine

## 2021-08-23 ENCOUNTER — Ambulatory Visit (INDEPENDENT_AMBULATORY_CARE_PROVIDER_SITE_OTHER): Payer: Medicare Other | Admitting: Internal Medicine

## 2021-08-23 VITALS — BP 132/68 | HR 73 | Temp 97.7°F | Ht 65.98 in | Wt 218.0 lb

## 2021-08-23 DIAGNOSIS — G629 Polyneuropathy, unspecified: Secondary | ICD-10-CM

## 2021-08-23 DIAGNOSIS — R0789 Other chest pain: Secondary | ICD-10-CM | POA: Diagnosis not present

## 2021-08-23 DIAGNOSIS — M5134 Other intervertebral disc degeneration, thoracic region: Secondary | ICD-10-CM | POA: Diagnosis not present

## 2021-08-23 DIAGNOSIS — R0781 Pleurodynia: Secondary | ICD-10-CM | POA: Diagnosis not present

## 2021-08-23 MED ORDER — CYCLOBENZAPRINE HCL 5 MG PO TABS: 5.0000 mg | ORAL_TABLET | Freq: Three times a day (TID) | ORAL | 0 refills | Status: DC | PRN

## 2021-08-23 NOTE — Progress Notes (Signed)
BP 132/68   Pulse 73   Temp 97.7 F (36.5 C) (Oral)   Ht 5' 5.98" (1.676 m)   Wt 218 lb (98.9 kg)   SpO2 93%   BMI 35.20 kg/m    Subjective:    Patient ID: Tammy [name], female    DOB: Aug 04, 1945, 76 y.o.   MRN: [id number]  Chief Complaint  Patient presents with   Back Pain    Left upper back pain that goes around to the chest, Patient states that has been going on for over a month. Also states that the area is very sore to the touch. No pain when not moving however when patient moves then her pain levels go up to 8/10   Shortness of Breath    HPI: Tammy [name] is a 76 y.o. female  Has had a partial mastectomy in 2020 , has new onset of pain on lateral part of the chest wall x 1 month, has burning pain and feels like pins and has back ? Rash feels like pins are sticking in her back too.   [place] but nothing hit her on her chest    Back Pain This is a new problem.  Shortness of Breath    Chief Complaint  Patient presents with   Back Pain    Left upper back pain that goes around to the chest, Patient states that has been going on for over a month. Also states that the area is very sore to the touch. No pain when not moving however when patient moves then her pain levels go up to 8/10   Shortness of Breath    Relevant past medical, surgical, family and social history reviewed and updated as indicated. Interim medical history since our last visit reviewed. Allergies and medications reviewed and updated.  Review of Systems  Respiratory:  Positive for shortness of breath.   Musculoskeletal:  Positive for back pain.    Per HPI unless specifically indicated above     Objective:    BP 132/68   Pulse 73   Temp 97.7 F (36.5 C) (Oral)   Ht 5' 5.98" (1.676 m)   Wt 218 lb (98.9 kg)   SpO2 93%   BMI 35.20 kg/m   Wt Readings from Last 3 Encounters:  08/23/21 218 lb (98.9 kg)  07/14/21 218 lb (98.9 kg)  06/27/21 220  lb 6.4 oz (100 kg)    Physical Exam Vitals and nursing note reviewed.  Constitutional:      General: She is not in acute distress.    Appearance: Normal appearance. She is not ill-appearing or diaphoretic.  Eyes:     Conjunctiva/sclera: Conjunctivae normal.  Pulmonary:     Breath sounds: No rhonchi.  Abdominal:     General: Abdomen is flat. Bowel sounds are normal. There is no distension.     Palpations: Abdomen is soft. There is no mass.     Tenderness: There is no abdominal tenderness. There is no guarding.  Skin:    General: Skin is warm and dry.     Coloration: Skin is not jaundiced.     Findings: No erythema.  Neurological:     Mental Status: She is alert.     Results for orders placed or performed in visit on 06/30/21  Microscopic Examination   Urine  Result Value Ref Range   WBC, UA >30 (H) 0 - 5 /hpf  RBC 3-10 (A) 0 - 2 /hpf   Epithelial Cells (non renal) 0-10 0 - 10 /hpf   Mucus, UA Present (A) Not Estab.   Bacteria, UA Moderate (A) None seen/Few  Urinalysis, Routine w reflex microscopic  Result Value Ref Range   Specific Gravity, UA >1.030 (H) 1.005 - 1.030   pH, UA 6.0 5.0 - 7.5   Color, UA Yellow Yellow   Appearance Ur Cloudy (A) Clear   Leukocytes,UA 1+ (A) Negative   Protein,UA 2+ (A) Negative/Trace   Glucose, UA 1+ (A) Negative   Ketones, UA Negative Negative   RBC, UA 1+ (A) Negative   Bilirubin, UA Negative Negative   Urobilinogen, Ur 0.2 0.2 - 1.0 mg/dL   Nitrite, UA Negative Negative   Microscopic Examination See below:         Current Outpatient Medications:    cyclobenzaprine (FLEXERIL) 5 MG tablet, Take 1 tablet (5 mg total) by mouth 3 (three) times daily as needed for muscle spasms., Disp: 30 tablet, Rfl: 0   albuterol (PROVENTIL) (2.5 MG/3ML) 0.083% nebulizer solution, Take 3 mLs (2.5 mg total) by nebulization every 6 (six) hours as needed for wheezing or shortness of breath., Disp: 225 mL, Rfl: 12   albuterol (VENTOLIN HFA) 108 (90  Base) MCG/ACT inhaler, Inhale 2 puffs into the lungs every 6 (six) hours as needed for wheezing or shortness of breath., Disp: 8 g, Rfl: 6   anastrozole (ARIMIDEX) 1 MG tablet, TAKE 1 TABLET BY MOUTH DAILY, Disp: 90 tablet, Rfl: 1   apixaban (ELIQUIS) 2.5 MG TABS tablet, Take 1 tablet (2.5 mg total) by mouth 2 (two) times daily., Disp: 180 tablet, Rfl: 1   Azelastine HCl (ASTEPRO) 0.15 % SOLN, Place 1 spray into the nose 2 (two) times daily., Disp: 30 mL, Rfl: 5   bisoprolol (ZEBETA) 10 MG tablet, Take 1 tablet (10 mg total) by mouth 2 (two) times daily., Disp: 60 tablet, Rfl: 2   blood glucose meter kit and supplies KIT, Dispense based on patient and insurance preference. Use one time daily as directed, E11.9, Disp: 1 each, Rfl: 0   Calcium Carb-Cholecalciferol (CALCIUM 600 + D) 600-200 MG-UNIT TABS, Take 1 tablet by mouth daily. , Disp: , Rfl:    cholecalciferol (VITAMIN D3) 25 MCG (1000 UT) tablet, Take 1,000 Units by mouth daily., Disp: , Rfl:    CRANBERRY PO, Take 2 capsules by mouth daily., Disp: , Rfl:    DULoxetine (CYMBALTA) 60 MG capsule, Take 2 capsules (120 mg total) by mouth daily., Disp: 180 capsule, Rfl: 1   fluticasone (FLONASE) 50 MCG/ACT nasal spray, Place 1 spray into both nostrils daily., Disp: 16 g, Rfl: 2   gabapentin (NEURONTIN) 300 MG capsule, TAKE 1 CAPSULE BY MOUTH THREE TIMES DAILY, Disp: 270 capsule, Rfl: 0   glipiZIDE (GLUCOTROL) 5 MG tablet, TAKE 1 TABLET BY MOUTH DAILY BEFORE BREAKFAST, Disp: 90 tablet, Rfl: 1   insulin degludec (TRESIBA FLEXTOUCH) 100 UNIT/ML FlexTouch Pen, Inject 36 Units into the skin daily., Disp: 30.6 mL, Rfl: 1   Insulin Pen Needle (PEN NEEDLES 3/16") 31G X 5 MM MISC, 1 each by Does not apply route daily., Disp: 100 each, Rfl: 12   isosorbide mononitrate (IMDUR) 60 MG 24 hr tablet, TAKE ONE TABLET BY MOUTH AT BEDTIME, Disp: 90 tablet, Rfl: 0   Lactobacillus (PROBIOTIC ACIDOPHILUS PO), Take 1 capsule by mouth daily at 12 noon., Disp: , Rfl:     montelukast (SINGULAIR) 10 MG tablet, TAKE 1 TABLET BY  MOUTH AT BEDTIME, Disp: 90 tablet, Rfl: 0   MYRBETRIQ 50 MG TB24 tablet, TAKE 1 TABLET BY MOUTH DAILY, Disp: 30 tablet, Rfl: 2   nitroGLYCERIN (NITROSTAT) 0.4 MG SL tablet, Place 1 tablet (0.4 mg total) under the tongue every 5 (five) minutes as needed for chest pain. Total of 3 doses, Disp: 25 tablet, Rfl: 6   pantoprazole (PROTONIX) 40 MG tablet, Take 1 tablet (40 mg total) by mouth daily., Disp: 90 tablet, Rfl: 1   Pumpkin Seed-Soy Germ (AZO BLADDER CONTROL/GO-LESS PO), Take 1 tablet by mouth in the morning and at bedtime., Disp: , Rfl:    rosuvastatin (CRESTOR) 40 MG tablet, TAKE 1 TABLET BY MOUTH DAILY, Disp: 90 tablet, Rfl: 1   senna (SENOKOT) 8.6 MG TABS tablet, Take 8.6 mg by mouth daily as needed for mild constipation., Disp: , Rfl:    SYNTHROID 150 MCG tablet, TAKE ONE TABLET ON AN EMPTY STOMACH WITHA GLASS OF WATER AT LEAST 30 TO 60 MINUTES BEFORE BREAKFAST, Disp: 90 tablet, Rfl: 3   TRELEGY ELLIPTA 100-62.5-25 MCG/ACT AEPB, Inhale 1 puff into the lungs daily., Disp: , Rfl:    trimethoprim (TRIMPEX) 100 MG tablet, TAKE ONE TABLET BY MOUTH EVERY DAY, Disp: 90 tablet, Rfl: 1   TRULICITY 4.5 AL/9.3XT SOPN, SMARTSIG:4.5 Milligram(s) SUB-Q Once a Week, Disp: , Rfl:    UNABLE TO FIND, Med Name: Energy, memory and mood enhancer, Disp: , Rfl:    ZINC CITRATE PO, Take 1 tablet by mouth daily., Disp: , Rfl:     Assessment & Plan:  Back / chest pain left sided  EKG wnl.  Is on Neurontin / Cymbalta Will check Xray of ribs/ thoracic  To call breast surgeons since she had a partial mastectomy.   Problem List Items Addressed This Visit   None Visit Diagnoses     Intermittent left-sided chest pain    -  Primary   Relevant Medications   cyclobenzaprine (FLEXERIL) 5 MG tablet   Other Relevant Orders   EKG 12-Lead (Completed)   DG Thoracic Spine 1 View (Completed)   DG Ribs Unilateral Left (Completed)   Neuropathy       Relevant Orders    DG Thoracic Spine 1 View (Completed)        Orders Placed This Encounter  Procedures   DG Thoracic Spine 1 View   DG Ribs Unilateral Left   EKG 12-Lead     Meds ordered this encounter  Medications   cyclobenzaprine (FLEXERIL) 5 MG tablet    Sig: Take 1 tablet (5 mg total) by mouth 3 (three) times daily as needed for muscle spasms.    Dispense:  30 tablet    Refill:  0     Follow up plan: No follow-ups on file.

## 2021-08-24 NOTE — Progress Notes (Signed)
Please let pt know this was normal. If pain continues needs to see her pcp and fu with ? [employer] who planted the stimualtor in back

## 2021-08-24 NOTE — Progress Notes (Signed)
Please let pt know this was normal.

## 2021-08-29 ENCOUNTER — Telehealth: Payer: Medicare Other

## 2021-08-29 ENCOUNTER — Telehealth: Payer: Self-pay

## 2021-08-29 NOTE — Telephone Encounter (Signed)
  Care Management   Follow Up Note   08/29/2021 Name: Tammy [name] MRN: [id number] DOB: 12/02/1945   Referred by: [name], DO Reason for referral : No chief complaint on file.   An unsuccessful telephone outreach was attempted today. The patient was referred to the case management team for assistance with care management and care coordination.   Follow Up Plan:  Spoke w/ patient briefly, unable to complete 6/19 tel visit d/t driving at time of call. Requested call back later in the week to RS.   Madelin Rear, PharmD, [place] Clinical Pharmacist  [facility]  [phone]

## 2021-09-07 ENCOUNTER — Ambulatory Visit: Payer: Medicare Other | Admitting: Family Medicine

## 2021-09-08 ENCOUNTER — Ambulatory Visit (INDEPENDENT_AMBULATORY_CARE_PROVIDER_SITE_OTHER): Payer: Medicare Other | Admitting: Family Medicine

## 2021-09-08 ENCOUNTER — Other Ambulatory Visit: Payer: Self-pay | Admitting: Family Medicine

## 2021-09-08 ENCOUNTER — Telehealth: Payer: Self-pay

## 2021-09-08 ENCOUNTER — Encounter: Payer: Self-pay | Admitting: Family Medicine

## 2021-09-08 VITALS — BP 117/73 | HR 71 | Temp 97.7°F | Wt 219.4 lb

## 2021-09-08 DIAGNOSIS — I25118 Atherosclerotic heart disease of native coronary artery with other forms of angina pectoris: Secondary | ICD-10-CM

## 2021-09-08 DIAGNOSIS — E1165 Type 2 diabetes mellitus with hyperglycemia: Secondary | ICD-10-CM | POA: Diagnosis not present

## 2021-09-08 DIAGNOSIS — R079 Chest pain, unspecified: Secondary | ICD-10-CM | POA: Diagnosis not present

## 2021-09-08 DIAGNOSIS — N183 Chronic kidney disease, stage 3 unspecified: Secondary | ICD-10-CM | POA: Diagnosis not present

## 2021-09-08 DIAGNOSIS — R300 Dysuria: Secondary | ICD-10-CM

## 2021-09-08 DIAGNOSIS — I129 Hypertensive chronic kidney disease with stage 1 through stage 4 chronic kidney disease, or unspecified chronic kidney disease: Secondary | ICD-10-CM | POA: Diagnosis not present

## 2021-09-08 DIAGNOSIS — E1122 Type 2 diabetes mellitus with diabetic chronic kidney disease: Secondary | ICD-10-CM

## 2021-09-08 DIAGNOSIS — Z794 Long term (current) use of insulin: Secondary | ICD-10-CM | POA: Diagnosis not present

## 2021-09-08 DIAGNOSIS — I10 Essential (primary) hypertension: Secondary | ICD-10-CM

## 2021-09-08 LAB — URINALYSIS, ROUTINE W REFLEX MICROSCOPIC
Bilirubin, UA: NEGATIVE
Ketones, UA: NEGATIVE
Nitrite, UA: NEGATIVE
Specific Gravity, UA: 1.025 (ref 1.005–1.030)
Urobilinogen, Ur: 0.2 mg/dL (ref 0.2–1.0)
pH, UA: 6 (ref 5.0–7.5)

## 2021-09-08 LAB — MICROSCOPIC EXAMINATION
RBC, Urine: >30 /hpf — ABNORMAL HIGH (ref 0–2)
WBC, UA: >30 /hpf — ABNORMAL HIGH (ref 0–5)

## 2021-09-08 LAB — BAYER DCA HB A1C WAIVED: HB A1C (BAYER DCA - WAIVED): 8.5 % — ABNORMAL HIGH (ref 4.8–5.6)

## 2021-09-08 MED ORDER — TRESIBA FLEXTOUCH 100 UNIT/ML ~~LOC~~ SOPN: 38.0000 [IU] | PEN_INJECTOR | Freq: Every day | SUBCUTANEOUS | 1 refills | Status: DC

## 2021-09-08 MED ORDER — LIDOCAINE 5 % EX PTCH: 1.0000 | MEDICATED_PATCH | CUTANEOUS | 0 refills | Status: DC

## 2021-09-08 NOTE — Telephone Encounter (Signed)
Copied from [place] [id number]. Topic: Referral - Question >> Sep 08, 2021 10:38 AM Rudene Anda wrote: Reason for CRM: Caller from [facility] called in regarding the referral and that the PT is not comfortable doing dry needling ont he pt due to diabetes, caller stated they also need additional notes for the referral, please advise.   Routing to provider and referral coordinator.

## 2021-09-08 NOTE — Assessment & Plan Note (Signed)
Doing much better with A1c of 8.3 down from 9.5. Will increase her insulin to 38 and then to 40 units based on AM sugars and continue other meds. Recheck 1 month. Call with any concerns. Labs drawn today. Refills given.

## 2021-09-08 NOTE — Assessment & Plan Note (Signed)
Encouraged diet and exercise with goal of losing 1-2lbs per day. Call with any concerns. Continue to monitor.

## 2021-09-08 NOTE — Assessment & Plan Note (Signed)
Under good control on current regimen. Continue current regimen. Continue to monitor. Call with any concerns. Refills given. Labs drawn today.   

## 2021-09-08 NOTE — Telephone Encounter (Signed)
Requested medication (s) are due for refill today: yes  Requested medication (s) are on the active medication list: yes  Last refill:  02/15/21 #180 1 refill  Future visit scheduled: today  Notes to clinic:  do you want to continue refills?     Requested Prescriptions  Pending Prescriptions Disp Refills   DULoxetine (CYMBALTA) 60 MG capsule [Pharmacy Med Name: DULOXETINE HCL 60 MG CAP] 180 capsule 1    Sig: TAKE 2 CAPSULES BY MOUTH EVERY DAY     Psychiatry: Antidepressants - SNRI - duloxetine Failed - 09/08/2021  4:07 PM      Failed - Cr in normal range and within 360 days    Creatinine  Date Value Ref Range Status  09/20/2011 1.31 (H) 0.60 - 1.30 mg/dL Final   Creatinine, Ser  Date Value Ref Range Status  06/27/2021 1.64 (H) 0.44 - 1.00 mg/dL Final         Passed - eGFR is 30 or above and within 360 days    EGFR (African American)  Date Value Ref Range Status  09/20/2011 48 (L)  Final   GFR calc Af Amer  Date Value Ref Range Status  02/10/2020 32 (L) >59 mL/min/1.73 Final    Comment:    **In accordance with recommendations from the NKF-ASN Task force,**   [facility] is in the process of updating its eGFR calculation to the   2021 CKD-EPI creatinine equation that estimates kidney function   without a race variable.    EGFR (Non-African Amer.)  Date Value Ref Range Status  09/20/2011 42 (L)  Final    Comment:    eGFR values <18m/min/1.73 m2 may be an indication of chronic kidney disease (CKD). Calculated eGFR is useful in patients with stable renal function. The eGFR calculation will not be reliable in acutely ill patients when serum creatinine is changing rapidly. It is not useful in  patients on dialysis. The eGFR calculation may not be applicable to patients at the low and high extremes of body sizes, pregnant women, and vegetarians.    GFR, Estimated  Date Value Ref Range Status  06/27/2021 32 (L) >60 mL/min Final    Comment:    (NOTE) Calculated using  the CKD-EPI Creatinine Equation (2021)    eGFR  Date Value Ref Range Status  03/23/2021 34 (L) >59 mL/min/1.73 Final         Passed - Completed PHQ-2 or PHQ-9 in the last 360 days      Passed - Last BP in normal range    BP Readings from Last 1 Encounters:  09/08/21 117/73         Passed - Valid encounter within last 6 months    Recent Outpatient Visits           Today Left-sided chest pain   [facility]   2 weeks ago Intermittent left-sided chest pain   [facility] [place], MD   1 month ago Breast pain, left   [facility], [place]   2 months ago Uncontrolled type 2 diabetes mellitus with hyperglycemia, with long-term current use of insulin ([facility]   [facility], [place]   2 months ago Uncontrolled type 2 diabetes mellitus with hyperglycemia, with long-term current use of insulin ([facility] [place]   [facility] [place] M[facility]b Merino DO       Future Appointments  Tomorrow Martyn Ehrich, NP [facility] [place]   In 3 months  [facility]

## 2021-09-08 NOTE — Progress Notes (Signed)
BP 117/73   Pulse 71   Temp 97.7 F (36.5 C) (Oral)   Wt 219 lb 6.4 oz (99.5 kg)   SpO2 95%   BMI 35.43 kg/m    Subjective:    Patient ID: Tammy [name], female    DOB: May 10, 1945, 76 y.o.   MRN: [id number]  HPI: Tammy [name] is a 76 y.o. female  Chief Complaint  Patient presents with   Diabetes   Hyperlipidemia   Hypertension   Depression   CHEST PAIN Duration: about a month Onset: sudden Quality: sharp and shooting Severity: severe Location:  ribs around her chest and into her arm Radiation: L arm Episode duration: constant  Frequency: constant Related to exertion: no Trauma: no Anxiety/recent stressors: no Status: worse Treatments attempted: nothing  Current pain status: in pain Shortness of breath: yes Cough: no Nausea: no Diaphoresis: no Heartburn: no Palpitations: no  DIABETES Hypoglycemic episodes:no Polydipsia/polyuria: no Visual disturbance: no Chest pain: yes Paresthesias: no Glucose Monitoring: yes  Accucheck frequency: Daily, 215 Taking Insulin?: yes Blood Pressure Monitoring: not checking Retinal Examination: Up to Date Foot Exam: Up to Date Diabetic Education: Completed Pneumovax: Up to Date Influenza: Up to Date Aspirin: no  HYPERTENSION / HYPERLIPIDEMIA Satisfied with current treatment? yes Duration of hypertension: chronic BP monitoring frequency: not checking BP medication side effects: no Duration of hyperlipidemia: chronic Cholesterol medication side effects: no Cholesterol supplements: none Past cholesterol medications: crestor Medication compliance: excellent compliance Aspirin: no Recent stressors: no Recurrent headaches: no Visual changes: no Palpitations: no Dyspnea: no Chest pain: no Lower extremity edema: no Dizzy/lightheaded: no  URINARY SYMPTOMS Duration: Couple of weeks Dysuria: yes Urinary frequency: yes Urgency: yes Small volume voids: yes Symptom severity: moderate Urinary incontinence: yes Foul  odor: no Hematuria: no Abdominal pain: no Back pain: yes Suprapubic pain/pressure: no Flank pain: no Fever:  no Vomiting: no Relief with cranberry juice: no Relief with pyridium: no Status: worse Previous urinary tract infection: yes Recurrent urinary tract infection: no Sexual activity: monogomous History of sexually transmitted disease: no Penile discharge: no Treatments attempted: increasing fluids    Relevant past medical, surgical, family and social history reviewed and updated as indicated. Interim medical history since our last visit reviewed. Allergies and medications reviewed and updated.  Review of Systems  Constitutional: Negative.   Respiratory: Negative.    Cardiovascular: Negative.   Gastrointestinal: Negative.   Genitourinary:  Positive for dysuria, frequency and urgency. Negative for decreased urine volume, difficulty urinating, dyspareunia, enuresis, flank pain, genital sores, hematuria, menstrual problem, pelvic pain, vaginal bleeding, vaginal discharge and vaginal pain.  Musculoskeletal:  Positive for back pain and myalgias. Negative for arthralgias, gait problem, joint swelling, neck pain and neck stiffness.  Skin: Negative.   Psychiatric/Behavioral: Negative.      Per HPI unless specifically indicated above     Objective:    BP 117/73   Pulse 71   Temp 97.7 F (36.5 C) (Oral)   Wt 219 lb 6.4 oz (99.5 kg)   SpO2 95%   BMI 35.43 kg/m   Wt Readings from Last 3 Encounters:  09/08/21 219 lb 6.4 oz (99.5 kg)  08/23/21 218 lb (98.9 kg)  07/14/21 218 lb (98.9 kg)    Physical Exam Vitals and nursing note reviewed.  Constitutional:      General: She is not in acute distress.    Appearance: Normal appearance. She is obese. She is not ill-appearing, toxic-appearing or diaphoretic.  HENT:     Head: Normocephalic and  atraumatic.     Right Ear: External ear normal.     Left Ear: External ear normal.     Nose: Nose normal.     Mouth/Throat:      Mouth: Mucous membranes are moist.     Pharynx: Oropharynx is clear.  Eyes:     General: No scleral icterus.       Right eye: No discharge.        Left eye: No discharge.     Extraocular Movements: Extraocular movements intact.     Conjunctiva/sclera: Conjunctivae normal.     Pupils: Pupils are equal, round, and reactive to light.  Cardiovascular:     Rate and Rhythm: Normal rate and regular rhythm.     Pulses: Normal pulses.     Heart sounds: Normal heart sounds. No murmur heard.    No friction rub. No gallop.  Pulmonary:     Effort: Pulmonary effort is normal. No respiratory distress.     Breath sounds: Normal breath sounds. No stridor. No wheezing, rhonchi or rales.  Chest:     Chest wall: No tenderness.  Musculoskeletal:        General: Tenderness (R rib 5-7 posteriorly) present. Normal range of motion.     Cervical back: Normal range of motion and neck supple.  Skin:    General: Skin is warm and dry.     Capillary Refill: Capillary refill takes less than 2 seconds.     Coloration: Skin is not jaundiced or pale.     Findings: No bruising, erythema, lesion or rash.  Neurological:     General: No focal deficit present.     Mental Status: She is alert and oriented to person, place, and time. Mental status is at baseline.  Psychiatric:        Mood and Affect: Mood normal.        Behavior: Behavior normal.        Thought Content: Thought content normal.        Judgment: Judgment normal.     Results for orders placed or performed in visit on 09/08/21  Microscopic Examination   Urine  Result Value Ref Range   WBC, UA >30 (H) 0 - 5 /hpf   RBC, Urine >30 (H) 0 - 2 /hpf   Epithelial Cells (non renal) 0-10 0 - 10 /hpf   Mucus, UA Present (A) Not Estab.   Bacteria, UA Few (A) None seen/Few   Yeast, UA Present (A) None seen  Bayer DCA Hb A1c Waived  Result Value Ref Range   HB A1C (BAYER DCA - WAIVED) 8.5 (H) 4.8 - 5.6 %  Urinalysis, Routine w reflex microscopic  Result  Value Ref Range   Specific Gravity, UA 1.025 1.005 - 1.030   pH, UA 6.0 5.0 - 7.5   Color, UA Yellow Yellow   Appearance Ur Cloudy (A) Clear   Leukocytes,UA 1+ (A) Negative   Protein,UA 1+ (A) Negative/Trace   Glucose, UA 3+ (A) Negative   Ketones, UA Negative Negative   RBC, UA 3+ (A) Negative   Bilirubin, UA Negative Negative   Urobilinogen, Ur 0.2 0.2 - 1.0 mg/dL   Nitrite, UA Negative Negative   Microscopic Examination See below:       Assessment & Plan:   Problem List Items Addressed This Visit       Cardiovascular and Mediastinum   Essential hypertension    Under good control on current regimen. Continue current regimen. Continue to monitor. Call with  any concerns. Refills given. Labs drawn today.        Relevant Orders   Comprehensive metabolic panel   CBC with Differential/Platelet     Endocrine   Uncontrolled type 2 diabetes mellitus with hyperglycemia, with long-term current use of insulin (HCC)    Doing much better with A1c of 8.3 down from 9.5. Will increase her insulin to 38 and then to 40 units based on AM sugars and continue other meds. Recheck 1 month. Call with any concerns. Labs drawn today. Refills given.       Relevant Medications   insulin degludec (TRESIBA FLEXTOUCH) 100 UNIT/ML FlexTouch Pen   Other Relevant Orders   Comprehensive metabolic panel   CBC with Differential/Platelet   Bayer DCA Hb A1c Waived (Completed)   Lipid Panel w/o Chol/HDL Ratio   Type 2 diabetes mellitus with stage 3 chronic kidney disease ([place])    Doing much better with A1c of 8.3 down from 9.5. Will increase her insulin to 38 and then to 40 units based on AM sugars and continue other meds. Recheck 1 month. Call with any concerns. Labs drawn today. Refills given.       Relevant Medications   insulin degludec (TRESIBA FLEXTOUCH) 100 UNIT/ML FlexTouch Pen     Genitourinary   Benign hypertensive renal disease    Under good control on current regimen. Continue current  regimen. Continue to monitor. Call with any concerns. Refills given. Labs drawn today.         Other   Morbid obesity due to excess calories ([place])    Encouraged diet and exercise with goal of losing 1-2lbs per day. Call with any concerns. Continue to monitor.       Relevant Medications   insulin degludec (TRESIBA FLEXTOUCH) 100 UNIT/ML FlexTouch Pen   Other Visit Diagnoses     Left-sided chest pain    -  Primary   Significant spasm over ribs. Likely causing her symptoms. Will get her into PT to see if they can help. Likely needs dry needling. Call with any concerns.    Relevant Orders   Ambulatory referral to Physical Therapy   Dysuria       Checking urine today. Await results. Treat as needed.    Relevant Orders   Urinalysis, Routine w reflex microscopic (Completed)        Follow up plan: Return in about 4 weeks (around 10/06/2021).

## 2021-09-09 ENCOUNTER — Other Ambulatory Visit: Payer: Medicare Other

## 2021-09-09 ENCOUNTER — Telehealth: Payer: Self-pay | Admitting: Primary Care

## 2021-09-09 ENCOUNTER — Ambulatory Visit (INDEPENDENT_AMBULATORY_CARE_PROVIDER_SITE_OTHER): Payer: Medicare Other | Admitting: Primary Care

## 2021-09-09 ENCOUNTER — Encounter: Payer: Self-pay | Admitting: Primary Care

## 2021-09-09 ENCOUNTER — Other Ambulatory Visit: Payer: Self-pay | Admitting: Family Medicine

## 2021-09-09 DIAGNOSIS — E785 Hyperlipidemia, unspecified: Secondary | ICD-10-CM | POA: Diagnosis not present

## 2021-09-09 DIAGNOSIS — J449 Chronic obstructive pulmonary disease, unspecified: Secondary | ICD-10-CM

## 2021-09-09 DIAGNOSIS — E875 Hyperkalemia: Secondary | ICD-10-CM | POA: Diagnosis not present

## 2021-09-09 DIAGNOSIS — Z794 Long term (current) use of insulin: Secondary | ICD-10-CM | POA: Diagnosis not present

## 2021-09-09 DIAGNOSIS — R0789 Other chest pain: Secondary | ICD-10-CM | POA: Diagnosis not present

## 2021-09-09 DIAGNOSIS — E1159 Type 2 diabetes mellitus with other circulatory complications: Secondary | ICD-10-CM

## 2021-09-09 DIAGNOSIS — I2510 Atherosclerotic heart disease of native coronary artery without angina pectoris: Secondary | ICD-10-CM

## 2021-09-09 DIAGNOSIS — I10 Essential (primary) hypertension: Secondary | ICD-10-CM | POA: Diagnosis not present

## 2021-09-09 LAB — COMPREHENSIVE METABOLIC PANEL
ALT: 17 IU/L (ref 0–32)
AST: 20 IU/L (ref 0–40)
Albumin/Globulin Ratio: 1.6 (ref 1.2–2.2)
Albumin: 4.2 g/dL (ref 3.7–4.7)
Alkaline Phosphatase: 88 IU/L (ref 44–121)
BUN/Creatinine Ratio: 16 (ref 12–28)
BUN: 26 mg/dL (ref 8–27)
Bilirubin Total: 0.3 mg/dL (ref 0.0–1.2)
CO2: 25 mmol/L (ref 20–29)
Calcium: 10.2 mg/dL (ref 8.7–10.3)
Chloride: 103 mmol/L (ref 96–106)
Creatinine, Ser: 1.65 mg/dL — ABNORMAL HIGH (ref 0.57–1.00)
Globulin, Total: 2.7 g/dL (ref 1.5–4.5)
Glucose: 221 mg/dL — ABNORMAL HIGH (ref 70–99)
Potassium: 6.3 mmol/L — ABNORMAL HIGH (ref 3.5–5.2)
Sodium: 142 mmol/L (ref 134–144)
Total Protein: 6.9 g/dL (ref 6.0–8.5)
eGFR: 32 mL/min/{1.73_m2} — ABNORMAL LOW (ref >59)

## 2021-09-09 LAB — CBC WITH DIFFERENTIAL/PLATELET
Basophils Absolute: 0.1 x10E3/uL (ref 0.0–0.2)
Basos: 1 %
EOS (ABSOLUTE): 0.4 x10E3/uL (ref 0.0–0.4)
Eos: 4 %
Hematocrit: 42.5 % (ref 34.0–46.6)
Hemoglobin: 13.2 g/dL (ref 11.1–15.9)
Immature Grans (Abs): 0.1 x10E3/uL (ref 0.0–0.1)
Immature Granulocytes: 1 %
Lymphocytes Absolute: 2.3 x10E3/uL (ref 0.7–3.1)
Lymphs: 28 %
MCH: 28.3 pg (ref 26.6–33.0)
MCHC: 31.1 g/dL — ABNORMAL LOW (ref 31.5–35.7)
MCV: 91 fL (ref 79–97)
Monocytes Absolute: 0.9 x10E3/uL (ref 0.1–0.9)
Monocytes: 11 %
Neutrophils Absolute: 4.6 x10E3/uL (ref 1.4–7.0)
Neutrophils: 55 %
Platelets: 260 x10E3/uL (ref 150–450)
RBC: 4.66 x10E6/uL (ref 3.77–5.28)
RDW: 13.9 % (ref 11.7–15.4)
WBC: 8.2 x10E3/uL (ref 3.4–10.8)

## 2021-09-09 LAB — LIPID PANEL W/O CHOL/HDL RATIO
Cholesterol, Total: 137 mg/dL (ref 100–199)
HDL: 47 mg/dL (ref >39)
LDL Chol Calc (NIH): 56 mg/dL (ref 0–99)
Triglycerides: 208 mg/dL — ABNORMAL HIGH (ref 0–149)
VLDL Cholesterol Cal: 34 mg/dL (ref 5–40)

## 2021-09-09 MED ORDER — TRELEGY ELLIPTA 100-62.5-25 MCG/ACT IN AEPB: 1.0000 | INHALATION_SPRAY | Freq: Every day | RESPIRATORY_TRACT | 0 refills | Status: DC

## 2021-09-09 NOTE — Assessment & Plan Note (Signed)
-   Patient has chronic dyspnea symptoms. No acute respiratory symptoms. Pulmonary function testing in July 2022 showed moderate-severe obstructive lung disease/ FEV1 52% predicted, ratio 59. No BD response. Trelegy is too expensive, she is only using sporadically. Albuterol does help. Advised she continue Trelegy 180mcg one puff daily until we can find an alternative on her plan or apply for patient assistance. FU in 3-4 months or sooner if needed.

## 2021-09-09 NOTE — Patient Instructions (Signed)
Recommendations Continue Trelegy 14mcg - take 1 puff every day in the morning  We will check with pharmacist to see what inhaler is formulary on your insurance plan Contact orthopedics for evaluation of left sided chest pain likely musculoskeletal  Follow-up 3 to 4 months with Dr. Halford Chessman

## 2021-09-09 NOTE — Progress Notes (Signed)
@Patient  ID: Tammy [name], female    DOB: 28-Nov-1945, 76 y.o.   MRN: [id number]  Chief Complaint  Patient presents with   Follow-up    SOB with exertion.     Referring provider: [name], DO  HPI: 76 year old female, former smoker quit 2009 (7.5-pack-year history).  Past medical history significant for COPD.  Patient of Dr. Halford Chessman, last seen in office on 07/14/2021.  09/09/2021 Patient presents today for 1 to 60-monthfollow-up COPD.  During her last office visit she was noted to have mild COPD exacerbation in setting of allergic rhinitis.  It was decided to hold off on antibiotics since recently on 2 separate ones for UTI.  Patient is concerned about being on prednisone due to history of diabetes.  She was recommended to try Flonase and Astepro nasal spray.   She experiences shortness of breath with exertion. No acute respiratory complaints today. Allergy symptoms improved with nasal spray. Trelegy is too expensive for her. She has medication but only uses it as needed. Albuterol helps. Denies chest tightness, wheezing or cough.  She continues to have left sided chest pain. Pain has been going on for 1-2 months. Muscle relaxor and tylenol have not helped. She has tried icy hot and lidocaine patches. She had breast surgery in June 2020. Hurts to raise her arm up, range of motion is limited.    Allergies  Allergen Reactions   Hydrocodone Other (See Comments)    Unresponsive   Aleve [Naproxen Sodium]     Hives & itching    Contrast Media [Iodinated Contrast Media]     Pt avoids due to kidney issues   Ioxaglate Other (See Comments)    (iodine) Pt avoids due to kidney issues    Oxycodone Other (See Comments)    hallucinations   Ranexa [Ranolazine]     Sick on stomach   Sulfa Antibiotics Other (See Comments)    Pt avoids due to kidney issues   Tramadol Other (See Comments)    nightmares    Immunization History  Administered Date(s) Administered   Fluad Quad(high Dose 65+)  01/02/2019, 02/10/2020, 12/30/2020   Influenza, High Dose Seasonal PF 11/16/2016, 12/18/2017   Influenza,inj,Quad PF,6+ Mos 12/16/2014, 11/24/2015   PFIZER(Purple Top)SARS-COV-2 Vaccination 05/08/2019, 05/29/2019   Pneumococcal Conjugate-13 07/08/2014   Pneumococcal Polysaccharide-23 03/13/2012   Zoster, Live 03/13/2010    Past Medical History:  Diagnosis Date   Asthma    Benign neoplasm of colon    [place]   [place] breast   Cervical disc disease    "bulging" - no limitations per pt   Chronic diastolic CHF (congestive heart failure) [place]    a. 07/2012 Echo: Nl EF. mild inferoseptal HK, Gr 2 DD, mild conc LVH, mild PR/TR, mild PAH; b. 08/2019 Echo: EF 55-60%, no rwma, Gr1 DD. Nl RV size/fxn.   CKD (chronic kidney disease), stage III (HCC)    COPD (chronic obstructive pulmonary disease) (HCC)    Coronary artery disease    a. 11/2011 Cath: LAD 5102m, LCX min irregs, RCA 70p, 10034mth L->R collats, EF 60%-->Med Rx; b. 03/2019 Cath: LM nl, LAD 40-45m40mX large, mild diff dzs. OM1/2 nl. RCA known to be 100m.86m>R collats from LAD & LCX/OM.   Diabetes mellitus without complication (HCC)    Fusion of lumbar spine 12/22/2015   GERD (gastroesophageal reflux disease)    History of DVT (deep vein thrombosis)    History of tobacco abuse    a.  Quit 2011.   Hyperlipidemia    Hypertension    Hypothyroidism    Lung cancer ([place])    Obesity    Palpitations    Personal history of radiation therapy    PONV (postoperative nausea and vomiting)    Pulmonary embolus ([place]) 12/25/2013   RLL. Presented with SOB and elevated d-dimer.    PVD (peripheral vascular disease) ([place])    a. PTA of right leg and left femoral artery with stenosis; b. 09/2020 ABI: R 0.93, L 0.88 - unchanged from prior study.   Stroke ([place]) 2015   TIAs - no deficits    Tobacco History: Social History   Tobacco Use  Smoking Status Former   Packs/day: 0.50   Years: 15.00   Total pack years: 7.50   Types:  Cigarettes   Quit date: 03/14/2007   Years since quitting: 14.5  Smokeless Tobacco Never   Counseling given: Not Answered   Outpatient Medications Prior to Visit  Medication Sig Dispense Refill   albuterol (PROVENTIL) (2.5 MG/3ML) 0.083% nebulizer solution Take 3 mLs (2.5 mg total) by nebulization every 6 (six) hours as needed for wheezing or shortness of breath. 225 mL 12   albuterol (VENTOLIN HFA) 108 (90 Base) MCG/ACT inhaler Inhale 2 puffs into the lungs every 6 (six) hours as needed for wheezing or shortness of breath. 8 g 6   anastrozole (ARIMIDEX) 1 MG tablet TAKE 1 TABLET BY MOUTH DAILY 90 tablet 1   apixaban (ELIQUIS) 2.5 MG TABS tablet Take 1 tablet (2.5 mg total) by mouth 2 (two) times daily. 180 tablet 1   blood glucose meter kit and supplies KIT Dispense based on patient and insurance preference. Use one time daily as directed, E11.9 1 each 0   Calcium Carb-Cholecalciferol (CALCIUM 600 + D) 600-200 MG-UNIT TABS Take 1 tablet by mouth daily.      cephALEXin (KEFLEX) 250 MG capsule Take 250 mg by mouth daily.     cholecalciferol (VITAMIN D3) 25 MCG (1000 UT) tablet Take 1,000 Units by mouth daily.     CRANBERRY PO Take 2 capsules by mouth daily.     cyclobenzaprine (FLEXERIL) 5 MG tablet Take 1 tablet (5 mg total) by mouth 3 (three) times daily as needed for muscle spasms. 30 tablet 0   DULoxetine (CYMBALTA) 60 MG capsule TAKE 2 CAPSULES BY MOUTH EVERY DAY 180 capsule 1   fluticasone (FLONASE) 50 MCG/ACT nasal spray Place 1 spray into both nostrils daily. 16 g 2   gabapentin (NEURONTIN) 300 MG capsule TAKE 1 CAPSULE BY MOUTH THREE TIMES DAILY 270 capsule 0   glipiZIDE (GLUCOTROL) 5 MG tablet TAKE 1 TABLET BY MOUTH DAILY BEFORE BREAKFAST 90 tablet 1   insulin degludec (TRESIBA FLEXTOUCH) 100 UNIT/ML FlexTouch Pen Inject 38-40 Units into the skin daily. 30.6 mL 1   Insulin Pen Needle (PEN NEEDLES 3/16") 31G X 5 MM MISC 1 each by Does not apply route daily. 100 each 12   isosorbide  mononitrate (IMDUR) 60 MG 24 hr tablet TAKE ONE TABLET BY MOUTH AT BEDTIME 90 tablet 0   Lactobacillus (PROBIOTIC ACIDOPHILUS PO) Take 1 capsule by mouth daily at 12 noon.     lidocaine (LIDODERM) 5 % Place 1 patch onto the skin daily. Remove & Discard patch within 12 hours or as directed by MD 30 patch 0   montelukast (SINGULAIR) 10 MG tablet TAKE 1 TABLET BY MOUTH AT BEDTIME 90 tablet 0   MYRBETRIQ 50 MG TB24 tablet TAKE 1 TABLET BY  MOUTH DAILY 30 tablet 2   nitroGLYCERIN (NITROSTAT) 0.4 MG SL tablet Place 1 tablet (0.4 mg total) under the tongue every 5 (five) minutes as needed for chest pain. Total of 3 doses 25 tablet 6   pantoprazole (PROTONIX) 40 MG tablet Take 1 tablet (40 mg total) by mouth daily. 90 tablet 1   Pumpkin Seed-Soy Germ (AZO BLADDER CONTROL/GO-LESS PO) Take 1 tablet by mouth in the morning and at bedtime.     rosuvastatin (CRESTOR) 40 MG tablet TAKE 1 TABLET BY MOUTH DAILY 90 tablet 1   senna (SENOKOT) 8.6 MG TABS tablet Take 8.6 mg by mouth daily as needed for mild constipation.     SYNTHROID 150 MCG tablet TAKE ONE TABLET ON AN EMPTY STOMACH WITHA GLASS OF WATER AT LEAST 30 TO 60 MINUTES BEFORE BREAKFAST 90 tablet 3   trimethoprim (TRIMPEX) 100 MG tablet TAKE ONE TABLET BY MOUTH EVERY DAY 90 tablet 1   TRULICITY 4.5 IW/9.7LG SOPN SMARTSIG:4.5 Milligram(s) SUB-Q Once a Week     UNABLE TO FIND Med Name: Energy, memory and mood enhancer     ZINC CITRATE PO Take 1 tablet by mouth daily.     bisoprolol (ZEBETA) 10 MG tablet Take 1 tablet (10 mg total) by mouth 2 (two) times daily. 60 tablet 2   TRELEGY ELLIPTA 100-62.5-25 MCG/ACT AEPB Inhale 1 puff into the lungs daily. (Patient not taking: Reported on 09/09/2021)     No facility-administered medications prior to visit.      Review of Systems  Review of Systems   Physical Exam  BP 112/74 (BP Location: Right Arm, Cuff Size: Large)   Pulse 82   Temp 98 F (36.7 C) (Temporal)   Ht 5' 5"  (1.651 m)   Wt 217 lb (98.4  kg) Comment: weight is per pt.  SpO2 95%   BMI 36.11 kg/m  Physical Exam Constitutional:      General: She is not in acute distress.    Appearance: Normal appearance. She is obese. She is not ill-appearing.  HENT:     Mouth/Throat:     Mouth: Mucous membranes are moist.     Pharynx: Oropharynx is clear.  Cardiovascular:     Rate and Rhythm: Normal rate and regular rhythm.  Pulmonary:     Effort: Pulmonary effort is normal.     Breath sounds: Normal breath sounds. No wheezing, rhonchi or rales.     Comments: CTA Musculoskeletal:        General: No swelling, deformity or signs of injury.     Cervical back: Normal range of motion and neck supple.     Comments: Left upper extremity limited ROM, breast tender to touch   Neurological:     General: No focal deficit present.     Mental Status: She is alert and oriented to person, place, and time. Mental status is at baseline.  Psychiatric:        Mood and Affect: Mood normal.        Behavior: Behavior normal.        Thought Content: Thought content normal.        Judgment: Judgment normal.      Lab Results:  CBC    Component Value Date/Time   WBC 8.2 09/08/2021 0924   WBC 8.1 06/27/2021 1325   RBC 4.66 09/08/2021 0924   RBC 4.53 06/27/2021 1325   HGB 13.2 09/08/2021 0924   HCT 42.5 09/08/2021 0924   PLT 260 09/08/2021 0924   MCV  91 09/08/2021 0924   MCH 28.3 09/08/2021 0924   MCH 27.6 06/27/2021 1325   MCHC 31.1 (L) 09/08/2021 0924   MCHC 31.2 06/27/2021 1325   RDW 13.9 09/08/2021 0924   LYMPHSABS 2.3 09/08/2021 0924   MONOABS 0.9 06/27/2021 1325   EOSABS 0.4 09/08/2021 0924   BASOSABS 0.1 09/08/2021 0924    BMET    Component Value Date/Time   NA 142 09/08/2021 0924   NA 140 09/20/2011 1134   K 6.3 (H) 09/08/2021 0924   K 4.4 09/20/2011 1134   CL 103 09/08/2021 0924   CL 103 09/20/2011 1134   CO2 25 09/08/2021 0924   CO2 28 09/20/2011 1134   GLUCOSE 221 (H) 09/08/2021 0924   GLUCOSE 273 (H) 06/27/2021 1325    GLUCOSE 139 (H) 09/20/2011 1134   BUN 26 09/08/2021 0924   BUN 17 09/20/2011 1134   CREATININE 1.65 (H) 09/08/2021 0924   CREATININE 1.31 (H) 09/20/2011 1134   CALCIUM 10.2 09/08/2021 0924   CALCIUM 8.4 (L) 09/20/2011 1134   GFRNONAA 32 (L) 06/27/2021 1325   GFRNONAA 42 (L) 09/20/2011 1134   GFRAA 32 (L) 02/10/2020 1440   GFRAA 48 (L) 09/20/2011 1134    BNP    Component Value Date/Time   BNP 55.0 08/31/2014 1036    ProBNP No results found for: "PROBNP"  Imaging: DG Ribs Unilateral Left  Result Date: 08/23/2021 CLINICAL DATA:  Left chest wall pain and tenderness for 1 month. EXAM: LEFT RIBS AND CHEST - 4 VIEW COMPARISON:  Chest radiograph on 06/08/2021 FINDINGS: No fracture or other bone lesions are seen involving the ribs. No evidence of pneumothorax or hemothorax. Left upper lobe scarring remains stable. Lungs are otherwise clear. Heart size is within normal limits. Aortic atherosclerotic calcification incidentally noted. Neurostimulator leads again seen within the thoracic spinal canal. IMPRESSION: No acute findings. Stable left upper lobe scarring. Electronically Signed   By: Marlaine Hind M.D.   On: 08/23/2021 12:40   DG Thoracic Spine 1 View  Result Date: 08/23/2021 CLINICAL DATA:  Back pain. EXAM: THORACIC SPINE 3 VIEW(S) COMPARISON:  Radiographs 09/30/2020 FINDINGS: Twelve rib-bearing thoracic type vertebral bodies. The alignment is normal. No evidence of acute fracture, paraspinal hematoma or widening of the interpedicular distance. Spinal stimulator position unchanged at the T8-9 disc space level. Stable mild degenerative changes throughout the visualized cervicothoracic spine. Previous lumbar fusion partially imaged. IMPRESSION: Stable examination of the thoracic spine following spinal stimulator placement. No acute osseous findings. Electronically Signed   By: Richardean Sale M.D.   On: 08/23/2021 12:38     Assessment & Plan:   COPD (chronic obstructive pulmonary  disease) ([place]) - Patient has chronic dyspnea symptoms. No acute respiratory symptoms. Pulmonary function testing in July 2022 showed moderate-severe obstructive lung disease/ FEV1 52% predicted, ratio 59. No BD response. Trelegy is too expensive, she is only using sporadically. Albuterol does help. Advised she continue Trelegy 154mg one puff daily until we can find an alternative on her plan or apply for patient assistance. FU in 3-4 months or sooner if needed.  Left-sided chest wall pain - Consistent with muscular skeletal pain. She had left breast excision in June 2020. Pain just recently started several months ago. Her range of motion LUE is limited, left breat is tender to touch. She had mammogram in May 2023 that showed post lumpectomy changes in the breast. No mammographic evidence of malignancy in either breast. Would recommend she follow up with breast surgeon and/or orthopedics for evaluation  muscular skeletal pain    Martyn Ehrich, NP 09/09/2021

## 2021-09-09 NOTE — Assessment & Plan Note (Addendum)
-   Consistent with muscular skeletal pain. She had left breast excision in June 2020. Pain just recently started several months ago. Her range of motion LUE is limited, left breat is tender to touch. She had mammogram in May 2023 that showed post lumpectomy changes in the breast. No mammographic evidence of malignancy in either breast. Would recommend she follow up with breast surgeon and/or orthopedics for evaluation muscular skeletal pain

## 2021-09-09 NOTE — Telephone Encounter (Signed)
Is there a cheaper alternative to Trelegy on her insurance? Examples- breztri, Advair, dulera, symbicort, spiriva or incruse. States that it cost her 100 dollars and is too expensive for her.

## 2021-09-10 LAB — BASIC METABOLIC PANEL
BUN/Creatinine Ratio: 17 (ref 12–28)
BUN: 29 mg/dL — ABNORMAL HIGH (ref 8–27)
CO2: 22 mmol/L (ref 20–29)
Calcium: 9.5 mg/dL (ref 8.7–10.3)
Chloride: 104 mmol/L (ref 96–106)
Creatinine, Ser: 1.66 mg/dL — ABNORMAL HIGH (ref 0.57–1.00)
Glucose: 283 mg/dL — ABNORMAL HIGH (ref 70–99)
Potassium: 5.4 mmol/L — ABNORMAL HIGH (ref 3.5–5.2)
Sodium: 142 mmol/L (ref 134–144)
eGFR: 31 mL/min/{1.73_m2} — ABNORMAL LOW (ref >59)

## 2021-09-11 NOTE — Progress Notes (Signed)
Reviewed and agree with assessment/plan.   Chesley Mires, MD [facility] Pulmonary/Critical Care 09/11/2021, 10:57 AM Pager:  [phone]

## 2021-09-12 ENCOUNTER — Other Ambulatory Visit: Payer: Self-pay | Admitting: Family Medicine

## 2021-09-12 DIAGNOSIS — E875 Hyperkalemia: Secondary | ICD-10-CM

## 2021-09-14 ENCOUNTER — Other Ambulatory Visit (HOSPITAL_COMMUNITY): Payer: Self-pay

## 2021-09-15 MED ORDER — BREZTRI AEROSPHERE 160-9-4.8 MCG/ACT IN AERO
2.0000 | INHALATION_SPRAY | Freq: Two times a day (BID) | RESPIRATORY_TRACT | 5 refills | Status: DC
Start: 2021-09-15 — End: 2021-12-06

## 2021-09-15 NOTE — Telephone Encounter (Signed)
Called and spoke with pt letting her know the info per BW after pharmacy investigation and she verbalized understanding. Rx for [facility] sent to preferred pharmacy for pt. Nothing further needed.

## 2021-09-15 NOTE — Telephone Encounter (Signed)
Can you please let patient know Tammy [name] appears to be cheaper alternatibve to Trelegy. If she is ok with changing we can place an order.

## 2021-09-19 NOTE — H&P (View-Only) (Signed)
Date:  09/20/2021   ID:  Tammy [name], DOB 1945/04/04, MRN [id number]  Patient Location:  [name] [id number]   Provider location:   [facility], [place], [place] office  PCP:  [name], DO  Cardiologist:  [name] [facility]  Chief Complaint  Patient presents with   office visit-L sided chest pain/SOB    Patient also reports having indigestion and jaw pain this past weekend while at the beach. Chest pain was relived after taking 2 Nitro tablets.     History of Present Illness:    Tammy [name] is a 76 y.o. female  past medical history of coronary artery disease, occluded mid RCA, in 2013  Nonobstructive LAD disease PAD with angioplasty of the legs bilaterally,  COPD,  chronic kidney disease diabetes type 2, diagnosis of PE in October 2015, started on anticoagulation, eliquis hypertension,  back surgery January 2014 with rods placed at that time,  remote smoking history stopped 5 years ago,  obesity,  deconditioned secondary to chronic back pain  catheterization January 2021 occluded RCA with collaterals nonobstructive LAD disease who presents for routine followup of her coronary artery disease   Last seen in clinic by myself June 2021 Seen by one of our providers August 2022  In the hospital end of March 2023 for chest pain, UTI was in her usual state of health until yesterday when she developed chest pain followed by emesis Overall did not feel well, etiology unclear She continues on to her nephrology appointment Was not seen for her visit, vitals obtained with no acute findings Was referred to the emergency room, noted to have tachycardia, hypoxia saturations into the 80s Denied leg swelling, no abdominal distention Beta-blocker held for possible bronchospasm She has UTI, actively followed by urology, given dose of ceftriaxone in the ER  Stress test performed April 2023, no ischemia low risk study  She reports having  indigestion and jaw pain while at the beach last weekend Reports pain was severe, did not know what to do Eventually took NTG x 2, sx resolved Since then has not felt well, worsening shortness of breath Unable to walk very far without having to stop and catch her breath, feels debilitated  She is concerned given recent episodes of chest pain  Lab work reviewed Total cholesterol 137 LDL 56 A1c 8.5 has been trending upwards Creatinine 1.66 BUN 29  EKG personally reviewed by myself on todays visit Normal sinus rhythm rate 74 bpm left axis deviation  Other past medical history reviewed Echo 09/03/19 for SOB Left ventricular ejection fraction, by estimation, is 55 to 60%. The  left ventricle has normal function. The left ventricle has no regional  wall motion abnormalities. Left ventricular diastolic parameters are  consistent with Grade I diastolic  dysfunction (impaired relaxation).   Cardiac cath 04/04/2019 Nonobstructive disease, stable mild to moderate mid LAD disease 45% Known occluded RCA with collaterals from left to right  Completed breast surgery with [name] after diagnosis of breast cancer  Chronic back pain, stimulator in place ("does not use it, does not work") On Cymbalta  Several back surgeries most recently March 2018  back surgery October 2017  Other past medical history Previously in the emergency room, treated for bronchial asthma. Takes her albuterol with some improvement of her symptoms. Shortness of breath symptoms have been dating back for many years.    may have developed a DVT from sitting long periods of time.  prior heart catheterizations in 2012 and 2013. Catheterization in 2012 documented occluded mid RCA with left to right collaterals, 40% mid LAD disease, 40% mid circumflex disease, 30% proximal LAD disease Catheterization in September 2013 showed normal LV function, occluded mid RCA with left to right collaterals, 50% mid and distal LAD  disease    Prior CV studies:   The following studies were reviewed today:  Stress test 03/28/2018 Low risk  CT chest 02/2018   Past Medical History:  Diagnosis Date   Asthma    Benign neoplasm of colon    Breast cancer ([place]) 08/2018   left breast   Cervical disc disease    "bulging" - no limitations per pt   Chronic diastolic CHF (congestive heart failure) ([place])    a. 07/2012 Echo: Nl EF. mild inferoseptal HK, Gr 2 DD, mild conc LVH, mild PR/TR, mild PAH; b. 08/2019 Echo: EF 55-60%, no rwma, Gr1 DD. Nl RV size/fxn.   CKD (chronic kidney disease), stage III (HCC)    COPD (chronic obstructive pulmonary disease) (HCC)    Coronary artery disease    a. 11/2011 Cath: LAD 18md, LCX min irregs, RCA 70p, 1058mith L->R collats, EF 60%-->Med Rx; b. 03/2019 Cath: LM nl, LAD 40-5073mCX large, mild diff dzs. OM1/2 nl. RCA known to be 100m97m->R collats from LAD & LCX/OM.   Diabetes mellitus without complication (HCC)    Fusion of lumbar spine 12/22/2015   GERD (gastroesophageal reflux disease)    History of DVT (deep vein thrombosis)    History of tobacco abuse    a. Quit 2011.   Hyperlipidemia    Hypertension    Hypothyroidism    Lung cancer (HCC)[place] Obesity    Palpitations    Personal history of radiation therapy    PONV (postoperative nausea and vomiting)    Pulmonary embolus (HCC)[place]/15/2015   RLL. Presented with SOB and elevated d-dimer.    PVD (peripheral vascular disease) (HCC)[place] a. PTA of right leg and left femoral artery with stenosis; b. 09/2020 ABI: R 0.93, L 0.88 - unchanged from prior study.   [facility]   TIAs - no deficits   Past Surgical History:  Procedure Laterality Date   ANGIOPLASTY / STENTING FEMORAL     PVD; angioplasty right leg and left femoral artery with stenosis.    APPENDECTOMY  12/2014   BACK SURGERY  03/2012   nerve stimulator inserted   BACK SURGERY     screws, rods replaced with new hardware.   BACK SURGERY  11/2016   BREAST LUMPECTOMY  Left 08/26/2018   BREAST SURGERY     CARDIAC CATHETERIZATION  12/07/2011   Mid LAD 50%, distal LAD 50%, mid RCA 70%, distal RCA 100%    CARDIAC CATHETERIZATION  01/04/2011   100% occluded mid RCA with good collaterals from distal LAD, normal LVEF.   CHOLECYSTECTOMY     COLONOSCOPY  2013   COLONOSCOPY WITH PROPOFOL N/A 05/24/2015   Procedure: COLONOSCOPY WITH PROPOFOL;  Surgeon: DarrLucilla Lame;  [place];  Laterality: N/A;  Diabetic - oral meds   COLONOSCOPY WITH PROPOFOL N/A 01/28/2019   Procedure: COLONOSCOPY WITH PROPOFOL;  Surgeon: TahiVirgel Manifold;  Location: [facility] ENDOSCOPY;  Service: Endoscopy;  Laterality: N/A;   DIAGNOSTIC LAPAROSCOPY     ESOPHAGOGASTRODUODENOSCOPY (EGD) WITH PROPOFOL N/A 01/28/2019   Procedure: ESOPHAGOGASTRODUODENOSCOPY (EGD) WITH PROPOFOL;  Surgeon: TahiVirgel Manifold;  [facility];  Service: Endoscopy;  Laterality: N/A;   LAPAROSCOPIC APPENDECTOMY N/A 01/06/2015   Procedure: APPENDECTOMY LAPAROSCOPIC;  Surgeon: [name], MD;  Location: [facility] ORS;  Service: General;  Laterality: N/A;   LEFT HEART CATH AND CORONARY ANGIOGRAPHY Left 04/04/2019   Procedure: LEFT HEART CATH AND CORONARY ANGIOGRAPHY;  Surgeon: [name], MD;  Location: [place] CV LAB;  Service: Cardiovascular;  Laterality: Left;   MASTECTOMY W/ SENTINEL NODE BIOPSY Left 08/26/2018   Procedure: PARTIAL MASTECTOMY WIDE EXCISION WITH SENTINEL LYMPH NODE BIOPSY LEFT;  Surgeon: [name], MD;  Location: [facility] ORS;  Service: General;  Laterality: Left;   POLYPECTOMY  05/24/2015   Procedure: POLYPECTOMY;  Surgeon: [name], MD;  Location: [place];  Service: Endoscopy;;   REPLACEMENT TOTAL KNEE Right    THYROIDECTOMY     TOTAL KNEE ARTHROPLASTY Right 11/13/2019   TRIGGER FINGER RELEASE       Current Meds  Medication Sig   albuterol (PROVENTIL) (2.5 MG/3ML) 0.083% nebulizer solution Take 3  mLs (2.5 mg total) by nebulization every 6 (six) hours as needed for wheezing or shortness of breath.   albuterol (VENTOLIN HFA) 108 (90 Base) MCG/ACT inhaler Inhale 2 puffs into the lungs every 6 (six) hours as needed for wheezing or shortness of breath.   anastrozole (ARIMIDEX) 1 MG tablet TAKE 1 TABLET BY MOUTH DAILY   apixaban (ELIQUIS) 2.5 MG TABS tablet Take 1 tablet (2.5 mg total) by mouth 2 (two) times daily.   bisoprolol (ZEBETA) 10 MG tablet Take 1 tablet (10 mg total) by mouth 2 (two) times daily.   blood glucose meter kit and supplies KIT Dispense based on patient and insurance preference. Use one time daily as directed, E11.9   Budeson-Glycopyrrol-Formoterol (BREZTRI AEROSPHERE) 160-9-4.8 MCG/ACT AERO Inhale 2 puffs into the lungs in the morning and at bedtime.   Calcium Carb-Cholecalciferol (CALCIUM 600 + D) 600-200 MG-UNIT TABS Take 1 tablet by mouth daily.    cephALEXin (KEFLEX) 250 MG capsule Take 250 mg by mouth daily.   cholecalciferol (VITAMIN D3) 25 MCG (1000 UT) tablet Take 1,000 Units by mouth daily.   CRANBERRY PO Take 2 capsules by mouth daily.   cyclobenzaprine (FLEXERIL) 5 MG tablet Take 1 tablet (5 mg total) by mouth 3 (three) times daily as needed for muscle spasms.   DULoxetine (CYMBALTA) 60 MG capsule TAKE 2 CAPSULES BY MOUTH EVERY DAY   fluticasone (FLONASE) 50 MCG/ACT nasal spray Place 1 spray into both nostrils daily.   gabapentin (NEURONTIN) 300 MG capsule TAKE 1 CAPSULE BY MOUTH THREE TIMES DAILY   glipiZIDE (GLUCOTROL) 5 MG tablet TAKE 1 TABLET BY MOUTH DAILY BEFORE BREAKFAST   insulin degludec (TRESIBA FLEXTOUCH) 100 UNIT/ML FlexTouch Pen Inject 38-40 Units into the skin daily.   Insulin Pen Needle (PEN NEEDLES 3/16") 31G X 5 MM MISC 1 each by Does not apply route daily.   isosorbide mononitrate (IMDUR) 60 MG 24 hr tablet TAKE ONE TABLET BY MOUTH AT BEDTIME   JARDIANCE 25 MG TABS tablet Take 25 mg by mouth every morning.   Lactobacillus (PROBIOTIC ACIDOPHILUS  PO) Take 1 capsule by mouth daily at 12 noon.   lidocaine (LIDODERM) 5 % Place 1 patch onto the skin daily. Remove & Discard patch within 12 hours or as directed by MD   montelukast (SINGULAIR) 10 MG tablet TAKE 1 TABLET BY MOUTH AT BEDTIME   MYRBETRIQ 50 MG TB24 tablet TAKE 1 TABLET BY MOUTH DAILY   pantoprazole (PROTONIX)  40 MG tablet Take 1 tablet (40 mg total) by mouth daily.   Pumpkin Seed-Soy Germ (AZO BLADDER CONTROL/GO-LESS PO) Take 1 tablet by mouth in the morning and at bedtime.   rosuvastatin (CRESTOR) 40 MG tablet TAKE 1 TABLET BY MOUTH DAILY   senna (SENOKOT) 8.6 MG TABS tablet Take 8.6 mg by mouth daily as needed for mild constipation.   SYNTHROID 150 MCG tablet TAKE ONE TABLET ON AN EMPTY STOMACH WITHA GLASS OF WATER AT LEAST 30 TO 60 MINUTES BEFORE BREAKFAST   trimethoprim (TRIMPEX) 100 MG tablet TAKE ONE TABLET BY MOUTH EVERY DAY   TRULICITY 4.5 OA/4.1YS SOPN SMARTSIG:4.5 Milligram(s) SUB-Q Once a Week   UNABLE TO FIND Med Name: Energy, memory and mood enhancer   ZINC CITRATE PO Take 1 tablet by mouth daily.   [DISCONTINUED] nitroGLYCERIN (NITROSTAT) 0.4 MG SL tablet Place 1 tablet (0.4 mg total) under the tongue every 5 (five) minutes as needed for chest pain. Total of 3 doses     Allergies:   Hydrocodone, Aleve [naproxen sodium], Contrast media [iodinated contrast media], Ioxaglate, Oxycodone, Ranexa [ranolazine], Sulfa antibiotics, and Tramadol   Social History   Tobacco Use   Smoking status: Former    Packs/day: 0.50    Years: 15.00    Total pack years: 7.50    Types: Cigarettes    Quit date: 03/14/2007    Years since quitting: 14.5   Smokeless tobacco: Never  Vaping Use   Vaping Use: Never used  Substance Use Topics   Alcohol use: No    Alcohol/week: 0.0 standard drinks of alcohol   Drug use: Never     Family Hx: The patient's family history includes COPD in her sister and sister; Cancer in her mother and sister; Colon cancer (age of onset: 42) in her  brother; Diabetes in her brother, brother, brother, and father; Heart attack in her brother, brother, brother, and father; Heart disease in her brother, brother, brother, and father; Hypertension in her brother, brother, brother, and father. There is no history of Breast cancer.  ROS:   Please see the history of present illness.    Review of Systems  Constitutional: Negative.   HENT: Negative.    Respiratory: Negative.    Cardiovascular: Negative.   Gastrointestinal: Negative.   Musculoskeletal: Negative.   Neurological: Negative.   Psychiatric/Behavioral: Negative.    All other systems reviewed and are negative.    Labs/Other Tests and Data Reviewed:    Recent Labs: 03/23/2021: TSH 0.510 09/08/2021: ALT 17; Hemoglobin 13.2; Platelets 260 09/09/2021: BUN 29; Creatinine, Ser 1.66; Potassium 5.4; Sodium 142   Recent Lipid Panel Lab Results  Component Value Date/Time   CHOL 137 09/08/2021 09:24 AM   CHOL 152 01/31/2016 02:19 PM   TRIG 208 (H) 09/08/2021 09:24 AM   TRIG 224 (H) 01/31/2016 02:19 PM   HDL 47 09/08/2021 09:24 AM   CHOLHDL 3.2 09/07/2015 08:05 AM   CHOLHDL 3.4 12/17/2014 10:17 AM   LDLCALC 56 09/08/2021 09:24 AM    Wt Readings from Last 3 Encounters:  09/20/21 217 lb (98.4 kg)  09/09/21 217 lb (98.4 kg)  09/08/21 219 lb 6.4 oz (99.5 kg)     Exam:    Vital Signs: Vital signs may also be detailed in the HPI BP 120/72 (BP Location: Right Arm, Patient Position: Sitting, Cuff Size: Large)   Pulse 74   Ht _0  (1.676 m)   Wt 217 lb (98.4 kg)   SpO2 97%   BMI 35.02  kg/m   Presenting in a wheelchair Constitutional:  oriented to person, place, and time. No distress.  HENT:  Head: Grossly normal Eyes:  no discharge. No scleral icterus.  Neck: No JVD, no carotid bruits  Cardiovascular: Regular rate and rhythm, no murmurs appreciated Pulmonary/Chest: Clear to auscultation bilaterally, no wheezes or rails Abdominal: Soft.  no distension.  no tenderness.   Musculoskeletal: Normal range of motion Neurological:  normal muscle tone. Coordination normal. No atrophy Skin: Skin warm and dry Psychiatric: normal affect, pleasant  ASSESSMENT & PLAN:    Coronary artery disease with stable angina Chest pain with hospitalization March 2023 Stress test April 2023 no significant ischemia Again with chest pain last week, most severe episode resolved with several nitro Feels worse in general, more short of breath Concerned about underlying ischemia and residual disease noted in her LAD Discussed first treatment options with her which are limited Would avoid cardiac CTA in the setting of renal dysfunction creatinine 1.6 Already on Imdur Recommend cardiac catheterization with prehydration right and left heart given her worsening shortness of breath and angina Tentatively scheduled for early next week  Abdominal aortic atherosclerosis  cholesterol at goal Continue aggressive management  PAD (peripheral artery disease) ([place]) Aggressive lipid management recommended  Morbid obesity due to excess calories ([place]) With diagnosis of type 2 diabetes, peripheral arterial disease, essential hypertension, hyperlipidemia, aortic atherosclerosis Sedentary, deconditioned  Essential hypertension Blood pressure is well controlled on today's visit. No changes made to the medications.  Uncontrolled type 2 diabetes mellitus with complication, without long-term current use of insulin ([place]) Diet restriction recommended, work with primary care for aggressive diabetes control, numbers above goal  Mixed hyperlipidemia Cholesterol is at goal on the current lipid regimen. No changes to the medications were made.  History of DVT and PE Continue Eliquis 2.5 mg twice daily prophylactic dosing We will hold Eliquis for cardiac catheterization   Total encounter time more than 40 minutes  Greater than 50% was spent in counseling and coordination of care with the  patient    Signed, Ida Rogue, MD  09/20/2021 2:22 PM    [place] Office [address] [place] [address], [place], [place] [address]

## 2021-09-19 NOTE — Progress Notes (Unsigned)
Date:  09/20/2021   ID:  Tammy [name], DOB 1945/04/04, MRN [id number]  Patient Location:  [name] [id number]   Provider location:   [facility], [place], [place] office  PCP:  [name], DO  Cardiologist:  [name] [facility]  Chief Complaint  Patient presents with   office visit-L sided chest pain/SOB    Patient also reports having indigestion and jaw pain this past weekend while at the beach. Chest pain was relived after taking 2 Nitro tablets.     History of Present Illness:    Tammy [name] is a 76 y.o. female  past medical history of coronary artery disease, occluded mid RCA, in 2013  Nonobstructive LAD disease PAD with angioplasty of the legs bilaterally,  COPD,  chronic kidney disease diabetes type 2, diagnosis of PE in October 2015, started on anticoagulation, eliquis hypertension,  back surgery January 2014 with rods placed at that time,  remote smoking history stopped 5 years ago,  obesity,  deconditioned secondary to chronic back pain  catheterization January 2021 occluded RCA with collaterals nonobstructive LAD disease who presents for routine followup of her coronary artery disease   Last seen in clinic by myself June 2021 Seen by one of our providers August 2022  In the hospital end of March 2023 for chest pain, UTI was in her usual state of health until yesterday when she developed chest pain followed by emesis Overall did not feel well, etiology unclear She continues on to her nephrology appointment Was not seen for her visit, vitals obtained with no acute findings Was referred to the emergency room, noted to have tachycardia, hypoxia saturations into the 80s Denied leg swelling, no abdominal distention Beta-blocker held for possible bronchospasm She has UTI, actively followed by urology, given dose of ceftriaxone in the ER  Stress test performed April 2023, no ischemia low risk study  She reports having  indigestion and jaw pain while at the beach last weekend Reports pain was severe, did not know what to do Eventually took NTG x 2, sx resolved Since then has not felt well, worsening shortness of breath Unable to walk very far without having to stop and catch her breath, feels debilitated  She is concerned given recent episodes of chest pain  Lab work reviewed Total cholesterol 137 LDL 56 A1c 8.5 has been trending upwards Creatinine 1.66 BUN 29  EKG personally reviewed by myself on todays visit Normal sinus rhythm rate 74 bpm left axis deviation  Other past medical history reviewed Echo 09/03/19 for SOB Left ventricular ejection fraction, by estimation, is 55 to 60%. The  left ventricle has normal function. The left ventricle has no regional  wall motion abnormalities. Left ventricular diastolic parameters are  consistent with Grade I diastolic  dysfunction (impaired relaxation).   Cardiac cath 04/04/2019 Nonobstructive disease, stable mild to moderate mid LAD disease 45% Known occluded RCA with collaterals from left to right  Completed breast surgery with [name] after diagnosis of breast cancer  Chronic back pain, stimulator in place ("does not use it, does not work") On Cymbalta  Several back surgeries most recently March 2018  back surgery October 2017  Other past medical history Previously in the emergency room, treated for bronchial asthma. Takes her albuterol with some improvement of her symptoms. Shortness of breath symptoms have been dating back for many years.    may have developed a DVT from sitting long periods of time.  prior heart catheterizations in 2012 and 2013. Catheterization in 2012 documented occluded mid RCA with left to right collaterals, 40% mid LAD disease, 40% mid circumflex disease, 30% proximal LAD disease Catheterization in September 2013 showed normal LV function, occluded mid RCA with left to right collaterals, 50% mid and distal LAD  disease    Prior CV studies:   The following studies were reviewed today:  Stress test 03/28/2018 Low risk  CT chest 02/2018   Past Medical History:  Diagnosis Date   Asthma    Benign neoplasm of colon    Breast cancer ([place]) 08/2018   left breast   Cervical disc disease    "bulging" - no limitations per pt   Chronic diastolic CHF (congestive heart failure) ([place])    a. 07/2012 Echo: Nl EF. mild inferoseptal HK, Gr 2 DD, mild conc LVH, mild PR/TR, mild PAH; b. 08/2019 Echo: EF 55-60%, no rwma, Gr1 DD. Nl RV size/fxn.   CKD (chronic kidney disease), stage III (HCC)    COPD (chronic obstructive pulmonary disease) (HCC)    Coronary artery disease    a. 11/2011 Cath: LAD 18md, LCX min irregs, RCA 70p, 1058mith L->R collats, EF 60%-->Med Rx; b. 03/2019 Cath: LM nl, LAD 40-5073mCX large, mild diff dzs. OM1/2 nl. RCA known to be 100m97m->R collats from LAD & LCX/OM.   Diabetes mellitus without complication (HCC)    Fusion of lumbar spine 12/22/2015   GERD (gastroesophageal reflux disease)    History of DVT (deep vein thrombosis)    History of tobacco abuse    a. Quit 2011.   Hyperlipidemia    Hypertension    Hypothyroidism    Lung cancer (HCC)[place] Obesity    Palpitations    Personal history of radiation therapy    PONV (postoperative nausea and vomiting)    Pulmonary embolus (HCC)[place]/15/2015   RLL. Presented with SOB and elevated d-dimer.    PVD (peripheral vascular disease) (HCC)[place] a. PTA of right leg and left femoral artery with stenosis; b. 09/2020 ABI: R 0.93, L 0.88 - unchanged from prior study.   [facility]   TIAs - no deficits   Past Surgical History:  Procedure Laterality Date   ANGIOPLASTY / STENTING FEMORAL     PVD; angioplasty right leg and left femoral artery with stenosis.    APPENDECTOMY  12/2014   BACK SURGERY  03/2012   nerve stimulator inserted   BACK SURGERY     screws, rods replaced with new hardware.   BACK SURGERY  11/2016   BREAST LUMPECTOMY  Left 08/26/2018   BREAST SURGERY     CARDIAC CATHETERIZATION  12/07/2011   Mid LAD 50%, distal LAD 50%, mid RCA 70%, distal RCA 100%    CARDIAC CATHETERIZATION  01/04/2011   100% occluded mid RCA with good collaterals from distal LAD, normal LVEF.   CHOLECYSTECTOMY     COLONOSCOPY  2013   COLONOSCOPY WITH PROPOFOL N/A 05/24/2015   Procedure: COLONOSCOPY WITH PROPOFOL;  Surgeon: DarrLucilla Lame;  [place] [place];  Laterality: N/A;  Diabetic - oral meds   COLONOSCOPY WITH PROPOFOL N/A 01/28/2019   Procedure: COLONOSCOPY WITH PROPOFOL;  Surgeon: TahiVirgel Manifold;  Location: [facility] ENDOSCOPY;  Service: Endoscopy;  Laterality: N/A;   DIAGNOSTIC LAPAROSCOPY     ESOPHAGOGASTRODUODENOSCOPY (EGD) WITH PROPOFOL N/A 01/28/2019   Procedure: ESOPHAGOGASTRODUODENOSCOPY (EGD) WITH PROPOFOL;  Surgeon: TahiVirgel Manifold;  [facility];  Service: Endoscopy;  Laterality: N/A;   LAPAROSCOPIC APPENDECTOMY N/A 01/06/2015   Procedure: APPENDECTOMY LAPAROSCOPIC;  Surgeon: [name], MD;  Location: [facility] ORS;  Service: General;  Laterality: N/A;   LEFT HEART CATH AND CORONARY ANGIOGRAPHY Left 04/04/2019   Procedure: LEFT HEART CATH AND CORONARY ANGIOGRAPHY;  Surgeon: [name], MD;  Location: [place] CV LAB;  Service: Cardiovascular;  Laterality: Left;   MASTECTOMY W/ SENTINEL NODE BIOPSY Left 08/26/2018   Procedure: PARTIAL MASTECTOMY WIDE EXCISION WITH SENTINEL LYMPH NODE BIOPSY LEFT;  Surgeon: [name], MD;  Location: [facility] ORS;  Service: General;  Laterality: Left;   POLYPECTOMY  05/24/2015   Procedure: POLYPECTOMY;  Surgeon: [name], MD;  Location: [place];  Service: Endoscopy;;   REPLACEMENT TOTAL KNEE Right    THYROIDECTOMY     TOTAL KNEE ARTHROPLASTY Right 11/13/2019   TRIGGER FINGER RELEASE       Current Meds  Medication Sig   albuterol (PROVENTIL) (2.5 MG/3ML) 0.083% nebulizer solution Take 3  mLs (2.5 mg total) by nebulization every 6 (six) hours as needed for wheezing or shortness of breath.   albuterol (VENTOLIN HFA) 108 (90 Base) MCG/ACT inhaler Inhale 2 puffs into the lungs every 6 (six) hours as needed for wheezing or shortness of breath.   anastrozole (ARIMIDEX) 1 MG tablet TAKE 1 TABLET BY MOUTH DAILY   apixaban (ELIQUIS) 2.5 MG TABS tablet Take 1 tablet (2.5 mg total) by mouth 2 (two) times daily.   bisoprolol (ZEBETA) 10 MG tablet Take 1 tablet (10 mg total) by mouth 2 (two) times daily.   blood glucose meter kit and supplies KIT Dispense based on patient and insurance preference. Use one time daily as directed, E11.9   Budeson-Glycopyrrol-Formoterol (BREZTRI AEROSPHERE) 160-9-4.8 MCG/ACT AERO Inhale 2 puffs into the lungs in the morning and at bedtime.   Calcium Carb-Cholecalciferol (CALCIUM 600 + D) 600-200 MG-UNIT TABS Take 1 tablet by mouth daily.    cephALEXin (KEFLEX) 250 MG capsule Take 250 mg by mouth daily.   cholecalciferol (VITAMIN D3) 25 MCG (1000 UT) tablet Take 1,000 Units by mouth daily.   CRANBERRY PO Take 2 capsules by mouth daily.   cyclobenzaprine (FLEXERIL) 5 MG tablet Take 1 tablet (5 mg total) by mouth 3 (three) times daily as needed for muscle spasms.   DULoxetine (CYMBALTA) 60 MG capsule TAKE 2 CAPSULES BY MOUTH EVERY DAY   fluticasone (FLONASE) 50 MCG/ACT nasal spray Place 1 spray into both nostrils daily.   gabapentin (NEURONTIN) 300 MG capsule TAKE 1 CAPSULE BY MOUTH THREE TIMES DAILY   glipiZIDE (GLUCOTROL) 5 MG tablet TAKE 1 TABLET BY MOUTH DAILY BEFORE BREAKFAST   insulin degludec (TRESIBA FLEXTOUCH) 100 UNIT/ML FlexTouch Pen Inject 38-40 Units into the skin daily.   Insulin Pen Needle (PEN NEEDLES 3/16") 31G X 5 MM MISC 1 each by Does not apply route daily.   isosorbide mononitrate (IMDUR) 60 MG 24 hr tablet TAKE ONE TABLET BY MOUTH AT BEDTIME   JARDIANCE 25 MG TABS tablet Take 25 mg by mouth every morning.   Lactobacillus (PROBIOTIC ACIDOPHILUS  PO) Take 1 capsule by mouth daily at 12 noon.   lidocaine (LIDODERM) 5 % Place 1 patch onto the skin daily. Remove & Discard patch within 12 hours or as directed by MD   montelukast (SINGULAIR) 10 MG tablet TAKE 1 TABLET BY MOUTH AT BEDTIME   MYRBETRIQ 50 MG TB24 tablet TAKE 1 TABLET BY MOUTH DAILY   pantoprazole (PROTONIX)  40 MG tablet Take 1 tablet (40 mg total) by mouth daily.   Pumpkin Seed-Soy Germ (AZO BLADDER CONTROL/GO-LESS PO) Take 1 tablet by mouth in the morning and at bedtime.   rosuvastatin (CRESTOR) 40 MG tablet TAKE 1 TABLET BY MOUTH DAILY   senna (SENOKOT) 8.6 MG TABS tablet Take 8.6 mg by mouth daily as needed for mild constipation.   SYNTHROID 150 MCG tablet TAKE ONE TABLET ON AN EMPTY STOMACH WITHA GLASS OF WATER AT LEAST 30 TO 60 MINUTES BEFORE BREAKFAST   trimethoprim (TRIMPEX) 100 MG tablet TAKE ONE TABLET BY MOUTH EVERY DAY   TRULICITY 4.5 OA/4.1YS SOPN SMARTSIG:4.5 Milligram(s) SUB-Q Once a Week   UNABLE TO FIND Med Name: Energy, memory and mood enhancer   ZINC CITRATE PO Take 1 tablet by mouth daily.   [DISCONTINUED] nitroGLYCERIN (NITROSTAT) 0.4 MG SL tablet Place 1 tablet (0.4 mg total) under the tongue every 5 (five) minutes as needed for chest pain. Total of 3 doses     Allergies:   Hydrocodone, Aleve [naproxen sodium], Contrast media [iodinated contrast media], Ioxaglate, Oxycodone, Ranexa [ranolazine], Sulfa antibiotics, and Tramadol   Social History   Tobacco Use   Smoking status: Former    Packs/day: 0.50    Years: 15.00    Total pack years: 7.50    Types: Cigarettes    Quit date: 03/14/2007    Years since quitting: 14.5   Smokeless tobacco: Never  Vaping Use   Vaping Use: Never used  Substance Use Topics   Alcohol use: No    Alcohol/week: 0.0 standard drinks of alcohol   Drug use: Never     Family Hx: The patient's family history includes COPD in her sister and sister; Cancer in her mother and sister; Colon cancer (age of onset: 42) in her  brother; Diabetes in her brother, brother, brother, and father; Heart attack in her brother, brother, brother, and father; Heart disease in her brother, brother, brother, and father; Hypertension in her brother, brother, brother, and father. There is no history of Breast cancer.  ROS:   Please see the history of present illness.    Review of Systems  Constitutional: Negative.   HENT: Negative.    Respiratory: Negative.    Cardiovascular: Negative.   Gastrointestinal: Negative.   Musculoskeletal: Negative.   Neurological: Negative.   Psychiatric/Behavioral: Negative.    All other systems reviewed and are negative.    Labs/Other Tests and Data Reviewed:    Recent Labs: 03/23/2021: TSH 0.510 09/08/2021: ALT 17; Hemoglobin 13.2; Platelets 260 09/09/2021: BUN 29; Creatinine, Ser 1.66; Potassium 5.4; Sodium 142   Recent Lipid Panel Lab Results  Component Value Date/Time   CHOL 137 09/08/2021 09:24 AM   CHOL 152 01/31/2016 02:19 PM   TRIG 208 (H) 09/08/2021 09:24 AM   TRIG 224 (H) 01/31/2016 02:19 PM   HDL 47 09/08/2021 09:24 AM   CHOLHDL 3.2 09/07/2015 08:05 AM   CHOLHDL 3.4 12/17/2014 10:17 AM   LDLCALC 56 09/08/2021 09:24 AM    Wt Readings from Last 3 Encounters:  09/20/21 217 lb (98.4 kg)  09/09/21 217 lb (98.4 kg)  09/08/21 219 lb 6.4 oz (99.5 kg)     Exam:    Vital Signs: Vital signs may also be detailed in the HPI BP 120/72 (BP Location: Right Arm, Patient Position: Sitting, Cuff Size: Large)   Pulse 74   Ht _0  (1.676 m)   Wt 217 lb (98.4 kg)   SpO2 97%   BMI 35.02  kg/m   Presenting in a wheelchair Constitutional:  oriented to person, place, and time. No distress.  HENT:  Head: Grossly normal Eyes:  no discharge. No scleral icterus.  Neck: No JVD, no carotid bruits  Cardiovascular: Regular rate and rhythm, no murmurs appreciated Pulmonary/Chest: Clear to auscultation bilaterally, no wheezes or rails Abdominal: Soft.  no distension.  no tenderness.   Musculoskeletal: Normal range of motion Neurological:  normal muscle tone. Coordination normal. No atrophy Skin: Skin warm and dry Psychiatric: normal affect, pleasant  ASSESSMENT & PLAN:    Coronary artery disease with stable angina Chest pain with hospitalization March 2023 Stress test April 2023 no significant ischemia Again with chest pain last week, most severe episode resolved with several nitro Feels worse in general, more short of breath Concerned about underlying ischemia and residual disease noted in her LAD Discussed first treatment options with her which are limited Would avoid cardiac CTA in the setting of renal dysfunction creatinine 1.6 Already on Imdur Recommend cardiac catheterization with prehydration right and left heart given her worsening shortness of breath and angina Tentatively scheduled for early next week  Abdominal aortic atherosclerosis  cholesterol at goal Continue aggressive management  PAD (peripheral artery disease) ([place]) Aggressive lipid management recommended  Morbid obesity due to excess calories ([place]) With diagnosis of type 2 diabetes, peripheral arterial disease, essential hypertension, hyperlipidemia, aortic atherosclerosis Sedentary, deconditioned  Essential hypertension Blood pressure is well controlled on today's visit. No changes made to the medications.  Uncontrolled type 2 diabetes mellitus with complication, without long-term current use of insulin ([place]) Diet restriction recommended, work with primary care for aggressive diabetes control, numbers above goal  Mixed hyperlipidemia Cholesterol is at goal on the current lipid regimen. No changes to the medications were made.  History of DVT and PE Continue Eliquis 2.5 mg twice daily prophylactic dosing We will hold Eliquis for cardiac catheterization   Total encounter time more than 40 minutes  Greater than 50% was spent in counseling and coordination of care with the  patient    Signed, Ida Rogue, MD  09/20/2021 2:22 PM    [place] Office [address] [place] [address], [place], [place] [address]

## 2021-09-20 ENCOUNTER — Ambulatory Visit (INDEPENDENT_AMBULATORY_CARE_PROVIDER_SITE_OTHER): Payer: Medicare Other | Admitting: Cardiovascular Disease

## 2021-09-20 ENCOUNTER — Encounter: Payer: Self-pay | Admitting: Cardiovascular Disease

## 2021-09-20 VITALS — BP 120/72 | HR 74 | Ht 66.0 in | Wt 217.0 lb

## 2021-09-20 DIAGNOSIS — I10 Essential (primary) hypertension: Secondary | ICD-10-CM | POA: Diagnosis not present

## 2021-09-20 DIAGNOSIS — I739 Peripheral vascular disease, unspecified: Secondary | ICD-10-CM

## 2021-09-20 DIAGNOSIS — Z86711 Personal history of pulmonary embolism: Secondary | ICD-10-CM

## 2021-09-20 DIAGNOSIS — I25118 Atherosclerotic heart disease of native coronary artery with other forms of angina pectoris: Secondary | ICD-10-CM | POA: Diagnosis not present

## 2021-09-20 DIAGNOSIS — N183 Chronic kidney disease, stage 3 unspecified: Secondary | ICD-10-CM

## 2021-09-20 DIAGNOSIS — E785 Hyperlipidemia, unspecified: Secondary | ICD-10-CM

## 2021-09-20 DIAGNOSIS — I5032 Chronic diastolic (congestive) heart failure: Secondary | ICD-10-CM

## 2021-09-20 MED ORDER — NITROGLYCERIN 0.4 MG SL SUBL: 0.4000 mg | SUBLINGUAL_TABLET | SUBLINGUAL | 6 refills | Status: AC | PRN

## 2021-09-20 NOTE — Patient Instructions (Addendum)
Medication Instructions:  No changes  If you need a refill on your cardiac medications before your next appointment, please call your pharmacy.   Lab work: Today: CBC, BMP  Medical Mall Entrance at [facility] 1st desk on the right to check in (REGISTRATION)  Lab hours: Monday- Friday (7:30 am- 5:30 pm)   Testing/Procedures:  You are scheduled for a Cardiac Catheterization on Monday, July 17 with Dr. Kathlyn [place].  1. Please arrive at the [place] [facility] at [place], [place], [place] [address] at 9:00 AM (This time is two hours before your procedure to ensure your preparation). Free valet parking service is available.   Special note: Every effort is made to have your procedure done on time. Please understand that emergencies sometimes delay scheduled procedures.  2. Diet: Do not eat solid foods after midnight.  You may have clear liquids until 5 AM upon the day of the procedure.  3. Labs: You will need to have blood drawn on Tuesday, July 11 at [facility], Go to 1st desk on your right to register.  Address: [place], [place] [address]  Open: 8am - 5pm  Phone: [phone]. You do not need to be fasting.  4. Medication instructions in preparation for your procedure:   Contrast Allergy: No  Stop taking Eliquis (Apixiban) on Saturday, July 15.   Take only 17 units of insulin the night before your procedure. Do not take any insulin on the day of the procedure.  On the morning of your procedure, take Aspirin and any morning medicines NOT listed above.  You may use sips of water.  5. Plan to go home the same day, you will only stay overnight if medically necessary. 6. You MUST have a responsible adult to drive you home. 7. An adult MUST be with you the first 24 hours after you arrive home. 8. Bring a current list of your medications, and the last time and date medication taken. 9. Bring ID and current insurance  cards. 10.Please wear clothes that are easy to get on and off and wear slip-on shoes.  Thank you for allowing [place] to care for you!   -- [place] [facility]   Follow-Up: At [facility], you and your health needs are our priority.  As part of our continuing mission to provide you with exceptional heart care, we have created designated Provider Care Teams.  These Care Teams include your primary Cardiologist (physician) and Advanced Practice Providers (APPs -  Physician Assistants and Nurse Practitioners) who all work together to provide you with the care you need, when you need it.  You will need a follow up appointment in 1 month  Providers on your designated Care Team:   Murray Hodgkins, NP Christell Faith, PA-C Cadence Kathlen Mody, [place]  COVID-19 Vaccine Information can be found at: [email or web address] For questions related to vaccine distribution or appointments, please email [email or web address] or call [phone].

## 2021-09-21 ENCOUNTER — Other Ambulatory Visit: Payer: Medicare Other

## 2021-09-21 DIAGNOSIS — E875 Hyperkalemia: Secondary | ICD-10-CM

## 2021-09-21 DIAGNOSIS — I5032 Chronic diastolic (congestive) heart failure: Secondary | ICD-10-CM | POA: Diagnosis not present

## 2021-09-22 ENCOUNTER — Telehealth: Payer: Self-pay

## 2021-09-22 DIAGNOSIS — S161XXA Strain of muscle, fascia and tendon at neck level, initial encounter: Secondary | ICD-10-CM | POA: Diagnosis not present

## 2021-09-22 LAB — BASIC METABOLIC PANEL
BUN/Creatinine Ratio: 17 (ref 12–28)
BUN: 27 mg/dL (ref 8–27)
CO2: 24 mmol/L (ref 20–29)
Calcium: 9.5 mg/dL (ref 8.7–10.3)
Chloride: 102 mmol/L (ref 96–106)
Creatinine, Ser: 1.61 mg/dL — ABNORMAL HIGH (ref 0.57–1.00)
Glucose: 126 mg/dL — ABNORMAL HIGH (ref 70–99)
Potassium: 5.3 mmol/L — ABNORMAL HIGH (ref 3.5–5.2)
Sodium: 141 mmol/L (ref 134–144)
eGFR: 33 mL/min/{1.73_m2} — ABNORMAL LOW (ref >59)

## 2021-09-22 LAB — CBC WITH DIFFERENTIAL/PLATELET
Basophils Absolute: 0.1 10*3/uL (ref 0.0–0.2)
Basos: 1 %
EOS (ABSOLUTE): 0.3 10*3/uL (ref 0.0–0.4)
Eos: 4 %
Hematocrit: 41.7 % (ref 34.0–46.6)
Hemoglobin: 13.4 g/dL (ref 11.1–15.9)
Immature Grans (Abs): 0.1 10*3/uL (ref 0.0–0.1)
Immature Granulocytes: 1 %
Lymphocytes Absolute: 2.3 10*3/uL (ref 0.7–3.1)
Lymphs: 27 %
MCH: 28.6 pg (ref 26.6–33.0)
MCHC: 32.1 g/dL (ref 31.5–35.7)
MCV: 89 fL (ref 79–97)
Monocytes Absolute: 0.9 10*3/uL (ref 0.1–0.9)
Monocytes: 11 %
Neutrophils Absolute: 4.8 10*3/uL (ref 1.4–7.0)
Neutrophils: 56 %
Platelets: 291 10*3/uL (ref 150–450)
RBC: 4.68 x10E6/uL (ref 3.77–5.28)
RDW: 13.4 % (ref 11.7–15.4)
WBC: 8.4 10*3/uL (ref 3.4–10.8)

## 2021-09-22 NOTE — Chronic Care Management (AMB) (Signed)
Chronic Care Management Pharmacy Assistant   Name: ARYIA DELIRA  MRN: [id number] DOB: 04/19/1945  Reason for Encounter: Disease State Diabetes Mellitus  Recent office visits:  09/08/21-Megan P.Johnson, DO (PCP) General follow up visit. Labs ordered. Will increase her insulin to 38 and then to 40 units based on AM sugars and continue other medications. Ambulatory referral to Physical Therapy. Follow up in 4 weeks. 08/23/21-Avanti Vigg, MD. Seen for back pain and shortness of breath. EKG ordered. T-spine xray ordered and Ribs xray ordered. Start on cyclobenzaprine (FLEXERIL) 5 MG tablet. 07/12/21-[name], DO (PCP, Video visit) Diabetic follow up visit. Mammogram ordered and ultrasound of the breast ordered. Return End of June. 06/30/21-[name], DO (PCP, Video visit) Diabetic follow up visit. Will increase her insulin to 36 units and recheck in 7-10 days. Follow up in 7-10 days. 06/21/21-[name], DO (PCP) Diabetic follow up visit. Follow up in 06/30/21. 06/17/21-[name], DO (PCP) Hospital follow up visit. Will increase her tresiba to 34units. Ambulatory referral to Endocrinology and AMB Referral to [facility]. Follow up in 4 days.  Recent consult visits:  09/20/21-Timothy Gloriajean Dell, MD (Cardiology) Office visit for left sided chest pain/SOB. Stop taking Eliquis (Apixiban) on Saturday, July 15. Follow up in 1 month. 09/09/21-Elizabeth Teresita Madura, NP (Pulmonary) Shortness of breath follow up visit. Follow up in 3-4 months. 07/26/21-Jeffery Richarda Overlie, MD (General surgery) General follow up visit. 07/14/21-Vineet Halford Chessman, MD (Pulmonology) Seen for shortness of breath. Astepro 1 spray in each nostril in the morning and in the evening and Flonase 1 spray in each nostril nightly. Follow up in 4 weeks. 06/27/21-[name], MD (Oncology, Video Visit) General follow up visit. Labs ordered. Follow up in 6 months. 06/08/21-Madhu Lanora Manis, MD  (Nephrology) Follow up visit.  Hospital visits:  Medication Reconciliation was completed by comparing discharge summary, patient's EMR and Pharmacy list, and upon discussion with patient.  Admitted to the hospital on 06/08/21 due to Chest pain. Discharge date was 06/09/21. Discharged from [facility]?Medications Started at [facility] Discharge:?? -started none noted  Medication Changes at Hospital Discharge: -Changed none noted  Medications Discontinued at Hospital Discharge: -Stopped none noted  Medications that remain the same after Hospital Discharge:??  -All other medications will remain the same.    Medications: Outpatient Encounter Medications as of 09/22/2021  Medication Sig   albuterol (PROVENTIL) (2.5 MG/3ML) 0.083% nebulizer solution Take 3 mLs (2.5 mg total) by nebulization every 6 (six) hours as needed for wheezing or shortness of breath.   albuterol (VENTOLIN HFA) 108 (90 Base) MCG/ACT inhaler Inhale 2 puffs into the lungs every 6 (six) hours as needed for wheezing or shortness of breath.   anastrozole (ARIMIDEX) 1 MG tablet TAKE 1 TABLET BY MOUTH DAILY   apixaban (ELIQUIS) 2.5 MG TABS tablet Take 1 tablet (2.5 mg total) by mouth 2 (two) times daily.   bisoprolol (ZEBETA) 10 MG tablet Take 1 tablet (10 mg total) by mouth 2 (two) times daily.   blood glucose meter kit and supplies KIT Dispense based on patient and insurance preference. Use one time daily as directed, E11.9   Budeson-Glycopyrrol-Formoterol (BREZTRI AEROSPHERE) 160-9-4.8 MCG/ACT AERO Inhale 2 puffs into the lungs in the morning and at bedtime.   Calcium Carb-Cholecalciferol (CALCIUM 600 + D) 600-200 MG-UNIT TABS Take 1 tablet by mouth daily.    cephALEXin (KEFLEX) 250 MG capsule Take 250 mg by mouth daily.   cholecalciferol (VITAMIN D3) 25 MCG (  1000 UT) tablet Take 1,000 Units by mouth daily.   CRANBERRY PO Take 2 capsules by mouth daily.   cyclobenzaprine (FLEXERIL) 5 MG tablet Take 1  tablet (5 mg total) by mouth 3 (three) times daily as needed for muscle spasms.   DULoxetine (CYMBALTA) 60 MG capsule TAKE 2 CAPSULES BY MOUTH EVERY DAY   fluticasone (FLONASE) 50 MCG/ACT nasal spray Place 1 spray into both nostrils daily.   gabapentin (NEURONTIN) 300 MG capsule TAKE 1 CAPSULE BY MOUTH THREE TIMES DAILY   glipiZIDE (GLUCOTROL) 5 MG tablet TAKE 1 TABLET BY MOUTH DAILY BEFORE BREAKFAST   insulin degludec (TRESIBA FLEXTOUCH) 100 UNIT/ML FlexTouch Pen Inject 38-40 Units into the skin daily.   Insulin Pen Needle (PEN NEEDLES 3/16") 31G X 5 MM MISC 1 each by Does not apply route daily.   isosorbide mononitrate (IMDUR) 60 MG 24 hr tablet TAKE ONE TABLET BY MOUTH AT BEDTIME   JARDIANCE 25 MG TABS tablet Take 25 mg by mouth every morning.   Lactobacillus (PROBIOTIC ACIDOPHILUS PO) Take 1 capsule by mouth daily at 12 noon.   lidocaine (LIDODERM) 5 % Place 1 patch onto the skin daily. Remove & Discard patch within 12 hours or as directed by MD   montelukast (SINGULAIR) 10 MG tablet TAKE 1 TABLET BY MOUTH AT BEDTIME   MYRBETRIQ 50 MG TB24 tablet TAKE 1 TABLET BY MOUTH DAILY   nitroGLYCERIN (NITROSTAT) 0.4 MG SL tablet Place 1 tablet (0.4 mg total) under the tongue every 5 (five) minutes as needed for chest pain. Total of 3 doses   pantoprazole (PROTONIX) 40 MG tablet Take 1 tablet (40 mg total) by mouth daily.   Pumpkin Seed-Soy Germ (AZO BLADDER CONTROL/GO-LESS PO) Take 1 tablet by mouth in the morning and at bedtime.   rosuvastatin (CRESTOR) 40 MG tablet TAKE 1 TABLET BY MOUTH DAILY   senna (SENOKOT) 8.6 MG TABS tablet Take 8.6 mg by mouth daily as needed for mild constipation.   SYNTHROID 150 MCG tablet TAKE ONE TABLET ON AN EMPTY STOMACH WITHA GLASS OF WATER AT LEAST 30 TO 60 MINUTES BEFORE BREAKFAST   trimethoprim (TRIMPEX) 100 MG tablet TAKE ONE TABLET BY MOUTH EVERY DAY   TRULICITY 4.5 VU/1.3HY SOPN SMARTSIG:4.5 Milligram(s) SUB-Q Once a Week   UNABLE TO FIND Med Name: Energy, memory  and mood enhancer   ZINC CITRATE PO Take 1 tablet by mouth daily.   No facility-administered encounter medications on file as of 09/22/2021.   Current antihyperglycemic regimen:  Trulicity 4.5 mg once weekly Tresiba 30 units daily  Glipizide 5 mg once daily   Unsuccessful attempts to complete assessment call. I have called patient 3x and left 3 voicemail's for the patient to return my call when available.   Adherence Review: Is the patient currently on a STATIN medication? Yes Is the patient currently on ACE/ARB medication? No Does the patient have >5 day gap between last estimated fill dates? No   Care Gaps: Zoster Vaccines:Never done  Star Rating Drugs: Glipizide 5 mg Last filled:08/18/21 90 DS Jardiance 25 mg Last filled:09/15/21 30 DS Rosuvastatin 40 mg Last filled:09/15/21 90 DS  [name], [place]

## 2021-09-26 ENCOUNTER — Encounter: Payer: Self-pay | Admitting: Cardiovascular Disease

## 2021-09-26 ENCOUNTER — Other Ambulatory Visit: Payer: Self-pay

## 2021-09-26 ENCOUNTER — Ambulatory Visit: Admit: 2021-09-26 | Payer: Medicare Other | Admitting: Cardiovascular Disease

## 2021-09-26 ENCOUNTER — Other Ambulatory Visit: Payer: Self-pay | Admitting: Cardiovascular Disease

## 2021-09-26 ENCOUNTER — Encounter
Admission: RE | Disposition: A | Discharge status: Home / Self Care | Payer: Self-pay | Source: Home / Self Care | Attending: Cardiovascular Disease

## 2021-09-26 ENCOUNTER — Ambulatory Visit
Admission: RE | Admit: 2021-09-26 | Discharge: 2021-09-26 | Disposition: A | Discharge status: Home / Self Care | Payer: Medicare Other | Attending: Cardiovascular Disease | Admitting: Cardiovascular Disease

## 2021-09-26 DIAGNOSIS — I2510 Atherosclerotic heart disease of native coronary artery without angina pectoris: Secondary | ICD-10-CM

## 2021-09-26 DIAGNOSIS — I5032 Chronic diastolic (congestive) heart failure: Secondary | ICD-10-CM | POA: Insufficient documentation

## 2021-09-26 DIAGNOSIS — I130 Hypertensive heart and chronic kidney disease with heart failure and stage 1 through stage 4 chronic kidney disease, or unspecified chronic kidney disease: Secondary | ICD-10-CM | POA: Insufficient documentation

## 2021-09-26 DIAGNOSIS — Z7901 Long term (current) use of anticoagulants: Secondary | ICD-10-CM | POA: Insufficient documentation

## 2021-09-26 DIAGNOSIS — I25118 Atherosclerotic heart disease of native coronary artery with other forms of angina pectoris: Secondary | ICD-10-CM | POA: Insufficient documentation

## 2021-09-26 DIAGNOSIS — Z86718 Personal history of other venous thrombosis and embolism: Secondary | ICD-10-CM | POA: Insufficient documentation

## 2021-09-26 DIAGNOSIS — I200 Unstable angina: Secondary | ICD-10-CM

## 2021-09-26 DIAGNOSIS — E782 Mixed hyperlipidemia: Secondary | ICD-10-CM | POA: Diagnosis not present

## 2021-09-26 DIAGNOSIS — N183 Chronic kidney disease, stage 3 unspecified: Secondary | ICD-10-CM | POA: Diagnosis not present

## 2021-09-26 DIAGNOSIS — I2582 Chronic total occlusion of coronary artery: Secondary | ICD-10-CM | POA: Insufficient documentation

## 2021-09-26 DIAGNOSIS — I700 Atherosclerosis of aorta: Secondary | ICD-10-CM | POA: Insufficient documentation

## 2021-09-26 DIAGNOSIS — J449 Chronic obstructive pulmonary disease, unspecified: Secondary | ICD-10-CM | POA: Diagnosis not present

## 2021-09-26 DIAGNOSIS — Z6835 Body mass index (BMI) 35.0-35.9, adult: Secondary | ICD-10-CM | POA: Insufficient documentation

## 2021-09-26 DIAGNOSIS — Z7984 Long term (current) use of oral hypoglycemic drugs: Secondary | ICD-10-CM | POA: Diagnosis not present

## 2021-09-26 DIAGNOSIS — E1122 Type 2 diabetes mellitus with diabetic chronic kidney disease: Secondary | ICD-10-CM | POA: Insufficient documentation

## 2021-09-26 DIAGNOSIS — Z794 Long term (current) use of insulin: Secondary | ICD-10-CM | POA: Diagnosis not present

## 2021-09-26 DIAGNOSIS — R0609 Other forms of dyspnea: Secondary | ICD-10-CM

## 2021-09-26 DIAGNOSIS — Z7985 Long-term (current) use of injectable non-insulin antidiabetic drugs: Secondary | ICD-10-CM | POA: Diagnosis not present

## 2021-09-26 DIAGNOSIS — Z86711 Personal history of pulmonary embolism: Secondary | ICD-10-CM | POA: Insufficient documentation

## 2021-09-26 DIAGNOSIS — M549 Dorsalgia, unspecified: Secondary | ICD-10-CM | POA: Insufficient documentation

## 2021-09-26 DIAGNOSIS — Z87891 Personal history of nicotine dependence: Secondary | ICD-10-CM | POA: Insufficient documentation

## 2021-09-26 DIAGNOSIS — E1151 Type 2 diabetes mellitus with diabetic peripheral angiopathy without gangrene: Secondary | ICD-10-CM | POA: Insufficient documentation

## 2021-09-26 DIAGNOSIS — G8929 Other chronic pain: Secondary | ICD-10-CM | POA: Diagnosis not present

## 2021-09-26 HISTORY — PX: RIGHT/LEFT HEART CATH AND CORONARY ANGIOGRAPHY: CATH118266

## 2021-09-26 LAB — BASIC METABOLIC PANEL
Anion gap: 6 (ref 5–15)
BUN: 29 mg/dL — ABNORMAL HIGH (ref 8–23)
CO2: 25 mmol/L (ref 22–32)
Calcium: 9.3 mg/dL (ref 8.9–10.3)
Chloride: 110 mmol/L (ref 98–111)
Creatinine, Ser: 1.91 mg/dL — ABNORMAL HIGH (ref 0.44–1.00)
GFR, Estimated: 27 mL/min — ABNORMAL LOW (ref >60)
Glucose, Bld: 149 mg/dL — ABNORMAL HIGH (ref 70–99)
Potassium: 4.9 mmol/L (ref 3.5–5.1)
Sodium: 141 mmol/L (ref 135–145)

## 2021-09-26 LAB — GLUCOSE, CAPILLARY: Glucose-Capillary: 105 mg/dL — ABNORMAL HIGH (ref 70–99)

## 2021-09-26 SURGERY — RIGHT/LEFT HEART CATH AND CORONARY ANGIOGRAPHY
Anesthesia: Moderate Sedation

## 2021-09-26 SURGERY — RIGHT/LEFT HEART CATH AND CORONARY ANGIOGRAPHY
Anesthesia: Moderate Sedation | Laterality: Bilateral

## 2021-09-26 MED ORDER — VERAPAMIL HCL 2.5 MG/ML IV SOLN
INTRAVENOUS | Status: AC
  Filled 2021-09-26: qty 2

## 2021-09-26 MED ORDER — HEPARIN (PORCINE) IN NACL 1000-0.9 UT/500ML-% IV SOLN
INTRAVENOUS | Status: AC
  Filled 2021-09-26: qty 1000

## 2021-09-26 MED ORDER — SODIUM CHLORIDE 0.9% FLUSH: 3.0000 mL | INTRAVENOUS | Status: DC | PRN

## 2021-09-26 MED ORDER — MIDAZOLAM HCL 2 MG/2ML IJ SOLN
INTRAMUSCULAR | Status: DC | PRN
  Administered 2021-09-26: 1 mg via INTRAVENOUS

## 2021-09-26 MED ORDER — IOHEXOL 300 MG/ML  SOLN
INTRAMUSCULAR | Status: DC | PRN
  Administered 2021-09-26: 26 mL

## 2021-09-26 MED ORDER — MIDAZOLAM HCL 2 MG/2ML IJ SOLN
INTRAMUSCULAR | Status: AC
  Filled 2021-09-26: qty 2

## 2021-09-26 MED ORDER — HEPARIN (PORCINE) IN NACL 1000-0.9 UT/500ML-% IV SOLN
INTRAVENOUS | Status: DC | PRN
  Administered 2021-09-26 (×2): 500 mL

## 2021-09-26 MED ORDER — SODIUM CHLORIDE 0.9 % IV SOLN: 250.0000 mL | INTRAVENOUS | Status: DC | PRN

## 2021-09-26 MED ORDER — SODIUM CHLORIDE 0.9 % WEIGHT BASED INFUSION: 1.0000 mL/kg/h | INTRAVENOUS | Status: DC

## 2021-09-26 MED ORDER — SODIUM CHLORIDE 0.9 % WEIGHT BASED INFUSION
3.0000 mL/kg/h | INTRAVENOUS | Status: AC
  Administered 2021-09-26: 3 mL/kg/h via INTRAVENOUS

## 2021-09-26 MED ORDER — SODIUM CHLORIDE 0.9 % WEIGHT BASED INFUSION
1.0000 mL/kg/h | INTRAVENOUS | Status: DC
  Administered 2021-09-26: 1 mL/kg/h via INTRAVENOUS

## 2021-09-26 MED ORDER — LIDOCAINE HCL 1 % IJ SOLN
INTRAMUSCULAR | Status: AC
  Filled 2021-09-26: qty 20

## 2021-09-26 MED ORDER — FENTANYL CITRATE (PF) 100 MCG/2ML IJ SOLN
INTRAMUSCULAR | Status: AC
  Filled 2021-09-26: qty 2

## 2021-09-26 MED ORDER — ACETAMINOPHEN 325 MG PO TABS
650.0000 mg | ORAL_TABLET | ORAL | Status: DC | PRN
Start: 2021-09-26 — End: 2021-09-26

## 2021-09-26 MED ORDER — FENTANYL CITRATE (PF) 100 MCG/2ML IJ SOLN
INTRAMUSCULAR | Status: DC | PRN
Start: 2021-09-26 — End: 2021-09-26
  Administered 2021-09-26: 25 ug via INTRAVENOUS

## 2021-09-26 MED ORDER — SODIUM CHLORIDE 0.9 % WEIGHT BASED INFUSION: 3.0000 mL/kg/h | INTRAVENOUS | Status: DC

## 2021-09-26 MED ORDER — LIDOCAINE HCL (PF) 1 % IJ SOLN
INTRAMUSCULAR | Status: DC | PRN
  Administered 2021-09-26: 2 mL

## 2021-09-26 MED ORDER — HEPARIN SODIUM (PORCINE) 1000 UNIT/ML IJ SOLN
INTRAMUSCULAR | Status: AC
  Filled 2021-09-26: qty 10

## 2021-09-26 MED ORDER — HEPARIN SODIUM (PORCINE) 1000 UNIT/ML IJ SOLN
INTRAMUSCULAR | Status: DC | PRN
  Administered 2021-09-26: 5000 [IU] via INTRAVENOUS

## 2021-09-26 MED ORDER — SODIUM CHLORIDE 0.9% FLUSH: 3.0000 mL | Freq: Two times a day (BID) | INTRAVENOUS | Status: DC

## 2021-09-26 MED ORDER — ONDANSETRON HCL 4 MG/2ML IJ SOLN: 4.0000 mg | Freq: Four times a day (QID) | INTRAMUSCULAR | Status: DC | PRN

## 2021-09-26 MED ORDER — SODIUM CHLORIDE 0.9% FLUSH
3.0000 mL | Freq: Two times a day (BID) | INTRAVENOUS | Status: DC
Start: 2021-09-26 — End: 2021-09-26

## 2021-09-26 MED ORDER — ASPIRIN 81 MG PO CHEW: 81.0000 mg | CHEWABLE_TABLET | ORAL | Status: DC

## 2021-09-26 MED ORDER — VERAPAMIL HCL 2.5 MG/ML IV SOLN
INTRAVENOUS | Status: DC | PRN
  Administered 2021-09-26: 2.5 mg via INTRAVENOUS

## 2021-09-26 MED ORDER — ASPIRIN 81 MG PO CHEW
CHEWABLE_TABLET | ORAL | Status: AC
  Filled 2021-09-26: qty 1

## 2021-09-26 MED ORDER — SODIUM CHLORIDE 0.9 % IV SOLN: INTRAVENOUS | Status: DC

## 2021-09-26 SURGICAL SUPPLY — 12 items
BAND ZEPHYR COMPRESS 30 LONG (HEMOSTASIS) ×1 IMPLANT
CATH BALLN WEDGE 5F 110CM (CATHETERS) ×1 IMPLANT
CATH INFINITI 5FR JK (CATHETERS) ×1 IMPLANT
DRAPE BRACHIAL (DRAPES) ×2 IMPLANT
GLIDESHEATH SLEND SS 6F .021 (SHEATH) ×1 IMPLANT
GUIDEWIRE INQWIRE 1.5J.035X260 (WIRE) IMPLANT
INQWIRE 1.5J .035X260CM (WIRE) ×2
PACK CARDIAC CATH (CUSTOM PROCEDURE TRAY) ×2 IMPLANT
PROTECTION STATION PRESSURIZED (MISCELLANEOUS) ×2
SET ATX SIMPLICITY (MISCELLANEOUS) ×1 IMPLANT
SHEATH GLIDE SLENDER 4/5FR (SHEATH) ×1 IMPLANT
STATION PROTECTION PRESSURIZED (MISCELLANEOUS) IMPLANT

## 2021-09-26 NOTE — Interval H&P Note (Signed)
Cath Lab Visit (complete for each Cath Lab visit)  Clinical Evaluation Leading to the Procedure:   ACS: No.  Non-ACS:    Anginal Classification: CCS III  Anti-ischemic medical therapy: Maximal Therapy (2 or more classes of medications)  Non-Invasive Test Results: No non-invasive testing performed  Prior CABG: No previous CABG      History and Physical Interval Note:  09/26/2021 12:39 PM  Tammy [name]  has presented today for surgery, with the diagnosis of R and L Cath    CAD PT NEEDS 2HR PREHYDRATION  Arrival time 9a.  The various methods of treatment have been discussed with the patient and family. After consideration of risks, benefits and other options for treatment, the patient has consented to  Procedure(s): RIGHT/LEFT HEART CATH AND CORONARY ANGIOGRAPHY (Bilateral) as a surgical intervention.  The patient's history has been reviewed, patient examined, no change in status, stable for surgery.  I have reviewed the patient's chart and labs.  Questions were answered to the patient's satisfaction.     Tammy [name]

## 2021-09-27 ENCOUNTER — Encounter: Payer: Self-pay | Admitting: Cardiovascular Disease

## 2021-09-29 ENCOUNTER — Telehealth: Payer: Self-pay

## 2021-09-29 NOTE — Telephone Encounter (Signed)
Pt. Reports she saw her last urinalysis 09/08/21 and thought PCP was going to call in an antibiotic. Still has pain with urination and cloudy urine. Please advise.

## 2021-09-30 MED ORDER — CIPROFLOXACIN HCL 500 MG PO TABS: 500.0000 mg | ORAL_TABLET | Freq: Two times a day (BID) | ORAL | 0 refills | Status: DC

## 2021-09-30 NOTE — Telephone Encounter (Signed)
Medication called into pharmacy, called patient to notify. NA LVM advising medication has been called in.

## 2021-09-30 NOTE — Telephone Encounter (Signed)
Computer will not let me send cipro- please call into [facility]. Thanks.

## 2021-10-03 ENCOUNTER — Ambulatory Visit: Payer: Self-pay

## 2021-10-03 NOTE — Telephone Encounter (Signed)
FYI, patient will call ortho for appointment

## 2021-10-03 NOTE — Telephone Encounter (Signed)
  Chief Complaint: arm pain Symptoms: L arm pain 10/10, limited movement, unable to touch it without hurting Frequency: ongoing for 2 months Pertinent Negatives: NA Disposition: [] ED /[] Urgent Care (no appt availability in office) / [x] Appointment(In office/virtual)/ []  [place] [facility]/ [] Home Care/ [] Refused Recommended Disposition /[] [place] [facility]/ []  Follow-up with PCP Additional Notes: pt states L arm pain hasn't gotten any better and has been to several different providers who just tell her to fu with someone else. Pt has seen Cardiology and Pulmonology, PT and PCP. Pt is still in a lot of pain and unable to do any activity with L arm. Pt scheduled appt for Dr. Wynetta Emery for 10/05/21 but then canceled d/t she is going to reach out to Ortho and see if they can help her with this pain. Advised pt to call back if unable to get appt with them and will schedule with Dr. Wynetta Emery.    Reason for Disposition  [1] SEVERE pain AND [2] not improved 2 hours after pain medicine  Answer Assessment - Initial Assessment Questions 1. ONSET: "When did the pain start?"     2 months  2. LOCATION: "Where is the pain located?"     L arm  3. PAIN: "How bad is the pain?" (Scale 1-10; or mild, moderate, severe)   - MILD (1-3): Doesn't interfere with normal activities.   - MODERATE (4-7): Interferes with normal activities (e.g., work or school) or awakens from sleep.   - SEVERE (8-10): Excruciating pain, unable to do any normal activities, unable to hold a cup of water.     10 6. OTHER SYMPTOMS: "Do you have any other symptoms?" (e.g., neck pain, swelling, rash, fever, numbness, weakness)     When taking a deep breath, pain gets worse, limited movement  Protocols used: Arm Pain-A-AH

## 2021-10-04 ENCOUNTER — Ambulatory Visit (INDEPENDENT_AMBULATORY_CARE_PROVIDER_SITE_OTHER): Payer: Medicare Other

## 2021-10-04 ENCOUNTER — Ambulatory Visit (INDEPENDENT_AMBULATORY_CARE_PROVIDER_SITE_OTHER): Payer: Medicare Other | Admitting: Vascular Surgery

## 2021-10-04 DIAGNOSIS — I739 Peripheral vascular disease, unspecified: Secondary | ICD-10-CM | POA: Diagnosis not present

## 2021-10-04 DIAGNOSIS — S161XXA Strain of muscle, fascia and tendon at neck level, initial encounter: Secondary | ICD-10-CM | POA: Diagnosis not present

## 2021-10-05 ENCOUNTER — Ambulatory Visit: Payer: Medicare Other | Admitting: Family Medicine

## 2021-10-08 ENCOUNTER — Other Ambulatory Visit: Payer: Self-pay | Admitting: Family Medicine

## 2021-10-10 DIAGNOSIS — N261 Atrophy of kidney (terminal): Secondary | ICD-10-CM | POA: Diagnosis not present

## 2021-10-10 DIAGNOSIS — N3020 Other chronic cystitis without hematuria: Secondary | ICD-10-CM | POA: Diagnosis not present

## 2021-10-10 NOTE — Telephone Encounter (Signed)
Requested medications are due for refill today.  yes  Requested medications are on the active medications list.  yes  Last refill. 06/09/2021 #60 2 refills  Future visit scheduled.   yes  Notes to clinic.  Failed protocol d/t abnormal labs.    Requested Prescriptions  Pending Prescriptions Disp Refills   bisoprolol (ZEBETA) 10 MG tablet [Pharmacy Med Name: BISOPROLOL FUMARATE 10 MG TAB] 60 tablet 2    Sig: TAKE ONE (1) TABLET BY MOUTH TWO TIMES PER DAY     Cardiovascular: Beta Blockers 2 Failed - 10/08/2021 12:27 PM      Failed - Cr in normal range and within 360 days    Creatinine  Date Value Ref Range Status  09/20/2011 1.31 (H) 0.60 - 1.30 mg/dL Final   Creatinine, Ser  Date Value Ref Range Status  09/26/2021 1.91 (H) 0.44 - 1.00 mg/dL Final         Passed - Last BP in normal range    BP Readings from Last 1 Encounters:  09/26/21 132/63         Passed - Last Heart Rate in normal range    Pulse Readings from Last 1 Encounters:  09/26/21 71         Passed - Valid encounter within last 6 months    Recent Outpatient Visits           1 month ago Left-sided chest pain   [place], Megan P, DO   1 month ago Intermittent left-sided chest pain   [facility] Vigg, Avanti, MD   3 months ago Breast pain, left   [facility], Megan P, DO   3 months ago Uncontrolled type 2 diabetes mellitus with hyperglycemia, with long-term current use of insulin ([place])   [place], Megan P, DO   3 months ago Uncontrolled type 2 diabetes mellitus with hyperglycemia, with long-term current use of insulin ([place])   [place], [place], DO       Future Appointments             In 2 weeks Gollan, [name], MD [facility], [facility]   In 2 months  [employer], [place]

## 2021-10-10 NOTE — Telephone Encounter (Signed)
Should get this from her cardiologist

## 2021-10-12 NOTE — Progress Notes (Unsigned)
   There were no vitals taken for this visit.   Subjective:    Patient ID: Tammy [name], female    DOB: 09/14/1945, 76 y.o.   MRN: [id number]  HPI: Tammy [name] is a 76 y.o. female  No chief complaint on file.  CHEST PAIN   Relevant past medical, surgical, family and social history reviewed and updated as indicated. Interim medical history since our last visit reviewed. Allergies and medications reviewed and updated.  Review of Systems  Per HPI unless specifically indicated above     Objective:    There were no vitals taken for this visit.  Wt Readings from Last 3 Encounters:  09/26/21 217 lb (98.4 kg)  09/20/21 217 lb (98.4 kg)  09/09/21 217 lb (98.4 kg)    Physical Exam  Results for orders placed or performed during the hospital encounter of 54/00/86  Basic metabolic panel  Result Value Ref Range   Sodium 141 135 - 145 mmol/L   Potassium 4.9 3.5 - 5.1 mmol/L   Chloride 110 98 - 111 mmol/L   CO2 25 22 - 32 mmol/L   Glucose, Bld 149 (H) 70 - 99 mg/dL   BUN 29 (H) 8 - 23 mg/dL   Creatinine, Ser 1.91 (H) 0.44 - 1.00 mg/dL   Calcium 9.3 8.9 - 10.3 mg/dL   GFR, Estimated 27 (L) >60 mL/min   Anion gap 6 5 - 15  Glucose, capillary  Result Value Ref Range   Glucose-Capillary 105 (H) 70 - 99 mg/dL      Assessment & Plan:   Problem List Items Addressed This Visit   None    Follow up plan: No follow-ups on file.

## 2021-10-13 ENCOUNTER — Encounter: Payer: Self-pay | Admitting: Nurse Practitioner

## 2021-10-13 ENCOUNTER — Telehealth: Payer: Self-pay

## 2021-10-13 ENCOUNTER — Ambulatory Visit (INDEPENDENT_AMBULATORY_CARE_PROVIDER_SITE_OTHER): Payer: Medicare Other | Admitting: Nurse Practitioner

## 2021-10-13 VITALS — BP 138/80 | HR 74 | Temp 98.4°F | Wt 215.9 lb

## 2021-10-13 DIAGNOSIS — R0789 Other chest pain: Secondary | ICD-10-CM

## 2021-10-13 DIAGNOSIS — I200 Unstable angina: Secondary | ICD-10-CM

## 2021-10-13 MED ORDER — OXYCODONE-ACETAMINOPHEN 2.5-325 MG PO TABS: 1.0000 | ORAL_TABLET | Freq: Three times a day (TID) | ORAL | 0 refills | Status: DC | PRN

## 2021-10-13 MED ORDER — OXYCODONE-ACETAMINOPHEN 5-325 MG PO TABS: 0.5000 | ORAL_TABLET | Freq: Three times a day (TID) | ORAL | 0 refills | Status: DC | PRN

## 2021-10-13 NOTE — Telephone Encounter (Signed)
Please let patient know that I changed the medication to th 5mg  since that is what they had and she needs to cut it in half.

## 2021-10-13 NOTE — Assessment & Plan Note (Signed)
Ongoing x several months. Seems musculoskeletal in nature. Pain with movement of left arm. Has an appt with Orthopedics next week.  Tylenol and Flexeril are not helping with the pain.  Has been to two PT appts but did not feel well enough to go today.  Continue with heating pad.  Will give Oxycodone 2.5mg  TID.  Patient states she is able to tolerate it at low doses.  Keep appt with PCP next week for reevaluation.

## 2021-10-13 NOTE — Telephone Encounter (Signed)
Pt verbalized understanding.

## 2021-10-13 NOTE — Telephone Encounter (Signed)
Called patient and advised to call cardiologist.

## 2021-10-13 NOTE — Telephone Encounter (Signed)
Can you find out if the 5mg  tab is scored and can be cut in half?

## 2021-10-13 NOTE — Telephone Encounter (Signed)
Spoke with pharmacist, 5.0-325 Percocets are scored and she should be able to cut them in half if need be

## 2021-10-13 NOTE — Telephone Encounter (Signed)
Copied from [place] [id number]. Topic: General - Call Back - No Documentation >> Oct 13, 2021 12:58 PM Sabas Sous wrote: Sherrie from [facility] called to report that they do not have the oxycodone-acetaminophen (PERCOCET) 2.5-325 MG tablet -However, they do have  5.0-325 MG  They need a new prescription called in

## 2021-10-14 ENCOUNTER — Other Ambulatory Visit: Payer: Self-pay | Admitting: Family Medicine

## 2021-10-14 NOTE — Telephone Encounter (Signed)
Requested medication (s) are due for refill today:   Yes  Requested medication (s) are on the active medication list:   Yes  Future visit scheduled:   Yes    Seen yesterday Demetrius Charity)   Last ordered: 04/15/2021 #90, 1 refill  No protocol assigned to this medication reason returned    Requested Prescriptions  Pending Prescriptions Disp Refills   trimethoprim (TRIMPEX) 100 MG tablet [Pharmacy Med Name: TRIMETHOPRIM 100 MG TAB] 90 tablet 1    Sig: TAKE ONE TABLET BY MOUTH EVERY DAY     Off-Protocol Failed - 10/14/2021 10:25 AM      Failed - Medication not assigned to a protocol, review manually.      Passed - Valid encounter within last 12 months    Recent Outpatient Visits           Yesterday Intermittent left-sided chest pain   [place], NP   1 month ago Left-sided chest pain   [name], Megan P, DO   1 month ago Intermittent left-sided chest pain   [place], MD   3 months ago Breast pain, left   [place], Megan P, DO   3 months ago Uncontrolled type 2 diabetes mellitus with hyperglycemia, with long-term current use of insulin ([place])   [place], [place], DO       Future Appointments             In 4 days Wynetta Emery, Barb Merino, DO [employer] [facility], [place]   In 1 week Gollan, [name], MD [place], [facility]   In 2 months  [employer], [place]

## 2021-10-18 ENCOUNTER — Encounter: Payer: Self-pay | Admitting: Family Medicine

## 2021-10-18 ENCOUNTER — Ambulatory Visit (INDEPENDENT_AMBULATORY_CARE_PROVIDER_SITE_OTHER): Payer: Medicare Other | Admitting: Family Medicine

## 2021-10-18 VITALS — BP 109/70 | HR 78 | Temp 97.8°F | Wt 217.1 lb

## 2021-10-18 DIAGNOSIS — M5134 Other intervertebral disc degeneration, thoracic region: Secondary | ICD-10-CM | POA: Diagnosis not present

## 2021-10-18 DIAGNOSIS — M5412 Radiculopathy, cervical region: Secondary | ICD-10-CM | POA: Diagnosis not present

## 2021-10-18 DIAGNOSIS — I200 Unstable angina: Secondary | ICD-10-CM

## 2021-10-18 DIAGNOSIS — M4724 Other spondylosis with radiculopathy, thoracic region: Secondary | ICD-10-CM | POA: Diagnosis not present

## 2021-10-18 DIAGNOSIS — M4722 Other spondylosis with radiculopathy, cervical region: Secondary | ICD-10-CM

## 2021-10-18 DIAGNOSIS — G8929 Other chronic pain: Secondary | ICD-10-CM | POA: Diagnosis not present

## 2021-10-18 DIAGNOSIS — M542 Cervicalgia: Secondary | ICD-10-CM | POA: Diagnosis not present

## 2021-10-18 DIAGNOSIS — M25511 Pain in right shoulder: Secondary | ICD-10-CM | POA: Diagnosis not present

## 2021-10-18 DIAGNOSIS — R0789 Other chest pain: Secondary | ICD-10-CM

## 2021-10-18 NOTE — Progress Notes (Unsigned)
BP 109/70   Pulse 78   Temp 97.8 F (36.6 C)   Wt 217 lb 1.6 oz (98.5 kg)   SpO2 96%   BMI 35.04 kg/m    Subjective:    Patient ID: Tammy [name], female    DOB: March 27, 1945, 76 y.o.   MRN: [id number]  HPI: Tammy [name] is a 76 y.o. female  Chief Complaint  Patient presents with  . Pain    Patient states she is still having left sided chest pain, left breast, arm pain and upper back pain. Pain is getting worse. Patient has stopped going to PT because it was causing her more pain, has ortho appointment this Thursday. Patient states its a burning feeling, feels like needles under her arm and sore to the touch. Patient states she feels a shooting pain in her left breast.    CHRONIC PAIN  Present dose:  Morphine equivalents Pain control status: {Blank single:19197::"controlled","uncontrolled","better","worse","exacerbated","stable"} Duration: {Blank single:19197::"chronic","months","years"} Location:  Quality: {Blank multiple:19196::"sharp","dull","aching","burning","cramping","ill-defined","itchy","pressure-like","pulling","shooting","sore","stabbing","tender","tearing","throbbing"} Current Pain Level: {Blank single:19197::"mild","moderate","severe","1/10","2/10","3/10","4/10","5/10","6/10","7/10","8/10","9/10","10/10"} Previous Pain Level: {Blank single:19197::"mild","moderate","severe","1/10","2/10","3/10","4/10","5/10","6/10","7/10","8/10","9/10","10/10"} Breakthrough pain: {Blank single:19197::"yes","no"} Benefit from narcotic medications: {Blank single:19197::"yes","no"} What Activities task can be accomplished with current medication? Interested in weaning off narcotics:{Blank single:19197::"yes","no"}   Stool softners/OTC fiber: {Blank single:19197::"yes","no"}  Previous pain specialty evaluation: {Blank single:19197::"yes","no"} Non-narcotic analgesic meds: {Blank single:19197::"yes","no"} Narcotic contract: {Blank single:19197::"yes","no"}   Relevant past medical, surgical,  family and social history reviewed and updated as indicated. Interim medical history since our last visit reviewed. Allergies and medications reviewed and updated.  Review of Systems  Per HPI unless specifically indicated above     Objective:    BP 109/70   Pulse 78   Temp 97.8 F (36.6 C)   Wt 217 lb 1.6 oz (98.5 kg)   SpO2 96%   BMI 35.04 kg/m   Wt Readings from Last 3 Encounters:  10/18/21 217 lb 1.6 oz (98.5 kg)  10/13/21 215 lb 14.4 oz (97.9 kg)  09/26/21 217 lb (98.4 kg)    Physical Exam Vitals and nursing note reviewed.  Constitutional:      General: She is not in acute distress.    Appearance: Normal appearance. She is obese. She is not ill-appearing, toxic-appearing or diaphoretic.     Comments: Wheelchair bound today  HENT:     Head: Normocephalic and atraumatic.     Right Ear: External ear normal.     Left Ear: External ear normal.     Nose: Nose normal.     Mouth/Throat:     Mouth: Mucous membranes are moist.     Pharynx: Oropharynx is clear.  Eyes:     General: No scleral icterus.       Right eye: No discharge.        Left eye: No discharge.     Extraocular Movements: Extraocular movements intact.     Conjunctiva/sclera: Conjunctivae normal.     Pupils: Pupils are equal, round, and reactive to light.  Cardiovascular:     Rate and Rhythm: Normal rate and regular rhythm.     Pulses: Normal pulses.     Heart sounds: Normal heart sounds. No murmur heard.    No friction rub. No gallop.  Pulmonary:     Effort: Pulmonary effort is normal. No respiratory distress.     Breath sounds: Normal breath sounds. No stridor. No wheezing, rhonchi or rales.  Chest:     Chest wall: No tenderness.  Musculoskeletal:        General: Tenderness present. No swelling, deformity  or signs of injury.     Cervical back: Normal range of motion and neck supple.     Right lower leg: No edema.     Left lower leg: No edema.  Skin:    General: Skin is warm and dry.     Capillary  Refill: Capillary refill takes less than 2 seconds.     Coloration: Skin is not jaundiced or pale.     Findings: No bruising, erythema, lesion or rash.  Neurological:     General: No focal deficit present.     Mental Status: She is alert and oriented to person, place, and time. Mental status is at baseline.  Psychiatric:        Mood and Affect: Mood normal.        Behavior: Behavior normal.        Thought Content: Thought content normal.        Judgment: Judgment normal.    Results for orders placed or performed during the hospital encounter of 51/02/58  Basic metabolic panel  Result Value Ref Range   Sodium 141 135 - 145 mmol/L   Potassium 4.9 3.5 - 5.1 mmol/L   Chloride 110 98 - 111 mmol/L   CO2 25 22 - 32 mmol/L   Glucose, Bld 149 (H) 70 - 99 mg/dL   BUN 29 (H) 8 - 23 mg/dL   Creatinine, Ser 1.91 (H) 0.44 - 1.00 mg/dL   Calcium 9.3 8.9 - 10.3 mg/dL   GFR, Estimated 27 (L) >60 mL/min   Anion gap 6 5 - 15  Glucose, capillary  Result Value Ref Range   Glucose-Capillary 105 (H) 70 - 99 mg/dL      Assessment & Plan:   Problem List Items Addressed This Visit   None    Follow up plan: No follow-ups on file.

## 2021-10-20 ENCOUNTER — Encounter: Payer: Self-pay | Admitting: Family Medicine

## 2021-10-20 DIAGNOSIS — H2511 Age-related nuclear cataract, right eye: Secondary | ICD-10-CM | POA: Diagnosis not present

## 2021-10-20 DIAGNOSIS — H43821 Vitreomacular adhesion, right eye: Secondary | ICD-10-CM | POA: Diagnosis not present

## 2021-10-20 DIAGNOSIS — M4722 Other spondylosis with radiculopathy, cervical region: Secondary | ICD-10-CM | POA: Insufficient documentation

## 2021-10-20 DIAGNOSIS — H25013 Cortical age-related cataract, bilateral: Secondary | ICD-10-CM | POA: Diagnosis not present

## 2021-10-20 DIAGNOSIS — H18413 Arcus senilis, bilateral: Secondary | ICD-10-CM | POA: Diagnosis not present

## 2021-10-20 DIAGNOSIS — H25043 Posterior subcapsular polar age-related cataract, bilateral: Secondary | ICD-10-CM | POA: Diagnosis not present

## 2021-10-20 DIAGNOSIS — H2513 Age-related nuclear cataract, bilateral: Secondary | ICD-10-CM | POA: Diagnosis not present

## 2021-10-20 DIAGNOSIS — M4724 Other spondylosis with radiculopathy, thoracic region: Secondary | ICD-10-CM | POA: Insufficient documentation

## 2021-10-20 DIAGNOSIS — H35371 Puckering of macula, right eye: Secondary | ICD-10-CM | POA: Diagnosis not present

## 2021-10-20 MED ORDER — GABAPENTIN 400 MG PO CAPS: 400.0000 mg | ORAL_CAPSULE | Freq: Three times a day (TID) | ORAL | 1 refills | Status: DC

## 2021-10-20 MED ORDER — OXYCODONE-ACETAMINOPHEN 5-325 MG PO TABS: 0.5000 | ORAL_TABLET | Freq: Three times a day (TID) | ORAL | 0 refills | Status: DC | PRN

## 2021-10-20 NOTE — Assessment & Plan Note (Signed)
Will check MRI of neck and thorax to see if that is contributing to her severe pain. Cardiology, mammogram and pulmonology work ups were normal. Slightly better with gabapentin. Concern for radicular pain or issue with nerve pain along her rib. Await results, increase gabapentin. Pain medicine PRN and referral to PM&R. Call with any concerns.

## 2021-10-21 ENCOUNTER — Ambulatory Visit: Payer: Self-pay | Admitting: *Deleted

## 2021-10-21 NOTE — Telephone Encounter (Signed)
Tammy [name], are we able to get this patient scheduled soon.

## 2021-10-21 NOTE — Telephone Encounter (Addendum)
Chief Complaint: pain spreading and worsened  Symptoms: saw Dr. Wynetta Emery 2 days ago for same but now on other side too Frequency: very frequent Pertinent Negatives: Patient denies fever or new symptoms, just different side Disposition: [] ED /[] Urgent Care (no appt availability in office) / [x] Appointment(In office/virtual)/ []  [facility]/ [] Home Care/ [] Refused Recommended Disposition /[] [facility]/ [x]  Follow-up with PCP Additional Notes: Pt saw Dr Wynetta Emery two days ago. Have not heard from MRI and is anxious to have it asap. Her pain has now moved to other side started under arm and around ribs. Now on both sides. She does not want to come back in, seen Wed, has upcoming appt, and does not want to go anywhere else. She wants the MRI scheduled asap and she has not heard from them. Please assist in getting this arranged. By the way, she states the pain med makes her too sleepy. Please inform her of MRI date.   Reason for Disposition  Muscle aches are a chronic symptom (recurrent or ongoing AND present > 4 weeks)  Answer Assessment - Initial Assessment Questions 1. LOCATION: "Where does it hurt?"       Ribs on both side, moves across chest 2. RADIATION: "Does the pain go anywhere else?" (e.g., into neck, jaw, arms, back)     No neck or jaw, hurts under arm 3. ONSET: "When did the chest pain begin?" (Minutes, hours or days)      Woke up with it during night, already was on left and now right 4. PATTERN: "Does the pain come and go, or has it been constant since it started?"  "Does it get worse with exertion?"      New pain from when saw MD 5. DURATION: "How long does it last" (e.g., seconds, minutes, hours)     constant 6. SEVERITY: "How bad is the pain?"  (e.g., Scale 1-10; mild, moderate, or severe)    - MILD (1-3): doesn't interfere with normal activities     - MODERATE (4-7): interferes with normal activities or awakens from sleep    - SEVERE (8-10): excruciating  pain, unable to do any normal activities       10 when she breathes, took half of oxycodone 7. 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 PULMONARY RISK FACTORS: "Do you have any history of lung disease?"  (e.g., blood clots in lung, asthma, emphysema, birth control pills)     Asthma, had PE before, still takes Eloquis 9. CAUSE: "What do you think is causing the chest pain?"     Unsure, sees Cardiologist next week 10. OTHER SYMPTOMS: "Do you have any other symptoms?" (e.g., dizziness, nausea, vomiting, sweating, fever, difficulty breathing, cough)       Not today, was nauseated and vomiting recently 11. PREGNANCY: "Is there any chance you are pregnant?" "When was your last menstrual period?"       no  Answer Assessment - Initial Assessment Questions 1. ONSET: "When did the muscle aches or body pains start?"      Saw doctor 2 days ago now pain on both sides of ribs 2. LOCATION: "What part of your body is hurting?" (e.g., entire  body, arms, legs)      Under arms and ribs 3. SEVERITY: "How bad is the pain?" (Scale 1-10; or mild, moderate, severe)   - MILD (1-3): doesn't interfere with normal activities    - MODERATE (4-7): interferes with normal activities or awakens from sleep    - SEVERE (8-10):  excruciating pain, unable to do any normal activities      10 4. CAUSE: "What do you think is causing the pains?"     unsure 5. FEVER: "Have you been having fever?"     no 6. OTHER SYMPTOMS: "Do you have any other symptoms?" (e.g., chest pain, weakness, rash, cold or flu symptoms, weight loss)     no 7. PREGNANCY: "Is there any chance you are pregnant?" "When was your last  menstrual period?"     na 8. TRAVEL: "Have you traveled out of the country in the last month?" (e.g., travel history, exposures)     no  Protocols used: Muscle Aches and Body Pain-A-AH, Chest Pain-A-AH

## 2021-10-24 ENCOUNTER — Ambulatory Visit (INDEPENDENT_AMBULATORY_CARE_PROVIDER_SITE_OTHER): Payer: Medicare Other

## 2021-10-24 NOTE — Patient Instructions (Signed)
Visit Information   Goals Addressed   None    Patient Care Plan: RNCM: Chronic Pain (Adult)  Completed 02/22/2021   Problem Identified: RNCM: Pain Management Plan (Chronic Pain) Resolved 02/22/2021  Priority: High     Long-Range Goal: RNCM: Pain Management Plan Developed Completed 02/22/2021  Start Date: 05/11/2020  Expected End Date: 10/07/2021  Recent Progress: On track  Priority: High  Note:   CARE PLAN ENTRY (see longitudinal plan of care for additional care plan information)  Current Barriers: resolving, duplicate goal Knowledge Deficits related to how to best control post operative knee replacement pain- right knee Chronic Disease Management support and education needs related to chronic pain and discomfort related to knee pain and post surgical knee replacement surgery and back pain  Nurse Case Manager Clinical Goal(s):  patient will verbalize understanding of plan for pain management for knee pain  patient will attend all scheduled medical appointments: recommended the patient call the office for a follow up patient will demonstrate improved adherence to prescribed treatment plan for pain relief of post surgical right knee pain as evidenced bypain relief and increased activity level  patient will demonstrate improved health management independence as evidenced byrecovery from knee replacement surgery   Interventions:  Inter-disciplinary care team collaboration (see longitudinal plan of care) Evaluation of current treatment plan related to pain control and patient's adherence to plan as established by provider. 05-11-2020: The patient states she is still having pain in her right knee with popping at times. Evaluation if she has dicussed with the surgeon and she has not. The patient will talk to the pcp today about this. The patient also has back pain and discomfort. Had a fall recently and went to the hospital to be evaluated. Had a UTI. Has finished course of abx.  10-12-2020: The  patient is now experience thoracic pain and will see a specialist on 10-14-2020 for evaluation and recommendation. The patient is using voltaran gel but that does not help much. Recommended cold or heat application. The patient has not tried this yet. The patient states that she may try this. She says when she is sitting down it does not hurt. No correlation to breathing. She is hopeful for answers on Thursday. 12-28-2020: The patient states her knee does not bother her it is her back and she cannot even life a gallon of milk without it feeling like it has a vise around it. The patient is compliant with medications and is aware of safety. Denies any new falls. Education and support given.  Advised patient to call provider for changes in level or intensity of pain and discomfort. 05-11-2020: Will discuss back pain and knee pain today with the provider. 10-12-2020: Saw pcp recently and follows up with specialist for thoracic pain on 10-14-2020. 12-28-2020: Advised to follow up with providers as needed.  Provided education to patient re: alternative methods of pain control and to continue to work with PT to manage pain and discomfort. 12-28-2020: Review of alternate methods for pain control. Will continue to monitor.  Reviewed medications with patient and discussed patient states opioids make her sick on her stomach. Does not like to take them but will for uncontrolled pain.  Reviewed scheduled/upcoming provider appointments including: 12-28-2020: No upcoming appointments to see the pcp but know to call for changes.Recommended to call the office for a follow up since it has been a while since the patient has been seen by pcp.   Patient Self Care Activities:  Patient verbalizes understanding of   plan to work with PT and call the provider for changes in pain level Attends all scheduled provider appointments Calls provider office for new concerns or questions - pain assessed= 10-12-2020: Still having a lot of unresolved  pain and discomfort. Will see specialist this week. Is hopeful for some answers to her pain and what to do to relieve pain. 12-28-2020: The patient states she is managing her pain. - pain treatment goals reviewed - patient response to treatment assessed  Follow up at 02-22-2021     Task: RNCM: Partner to Develop Chronic Pain Management Plan Completed 10/12/2020  Note:   Care Management Activities:    - pain assessed - pain treatment goals reviewed - patient response to treatment assessed    Notes: The patients pain is controlled at this time    Patient Care Plan: RNCM: Hypertension (Adult)  Completed 02/22/2021   Problem Identified: RNCM: Hypertension (Hypertension) Resolved 02/22/2021  Priority: Medium     Long-Range Goal: RNCM: Hypertension Monitored Completed 02/22/2021  Start Date: 05/11/2020  Expected End Date: 10/07/2021  Recent Progress: On track  Priority: Medium  Note:   Objective: resolving, duplicate goal  Last practice recorded BP readings:  BP Readings from Last 3 Encounters:  12/22/20 (P) 128/68  11/10/20 125/70  10/28/20 133/82   Most recent eGFR/CrCl:  Lab Results  Component Value Date   EGFR 32 (L) 11/09/2020    No components found for: CRCL Current Barriers:  Knowledge Deficits related to basic understanding of hypertension pathophysiology and self care management Knowledge Deficits related to understanding of medications prescribed for management of hypertension Unable to independently manage HTN  Case Manager Clinical Goal(s):  patient will verbalize understanding of plan for hypertension management patient will demonstrate improved adherence to prescribed treatment plan for hypertension as evidenced by taking all medications as prescribed, monitoring and recording blood pressure as directed, adhering to low sodium/DASH diet patient will demonstrate improved health management independence as evidenced by checking blood pressure as directed and  notifying PCP if SBP>160 or DBP > 90, taking all medications as prescribe, and adhering to a low sodium diet as discussed. patient will verbalize basic understanding of hypertension disease process and self health management plan as evidenced by compliance with medications, compliance with heart heatlhy/ADA diet and working with the CCM team to optimize health and well being.  Interventions:  Collaboration with Johnson, Megan P, DO regarding development and update of comprehensive plan of care as evidenced by provider attestation and co-signature Inter-disciplinary care team collaboration (see longitudinal plan of care) Evaluation of current treatment plan related to hypertension self management and patient's adherence to plan as established by provider. 12-28-2020: Denies any issues with her HTN. She states that her pressure is usually at 120/70.  Provided education to patient re: stroke prevention, s/s of heart attack and stroke, DASH diet, complications of uncontrolled blood pressure Reviewed medications with patient and discussed importance of compliance. 12-28-2020: The patient states she is compliant with her medications  Discussed plans with patient for ongoing care management follow up and provided patient with direct contact information for care management team Advised patient, providing education and rationale, to monitor blood pressure daily and record, calling PCP for findings outside established parameters.  Self-Care Activities: - Self administers medications as prescribed Attends all scheduled provider appointments Calls provider office for new concerns, questions, or BP outside discussed parameters Checks BP and records as discussed Follows a low sodium diet/DASH diet Patient Goals: - check blood pressure weekly - choose a   place to take my blood pressure (home, clinic or office, retail store) - write blood pressure results in a log or diary - agree on reward when goals are met -  agree to work together to make changes - ask questions to understand - have a family meeting to talk about healthy habits - learn about high blood pressure  Follow Up Plan: Telephone follow up appointment with care management team member scheduled for: 02-22-2021 at 1030 am    Task: RNCM: Identify and Monitor Blood Pressure Elevation Completed 10/12/2020  Outcome: Positive  Note:   Care Management Activities:    - blood pressure trends reviewed - home or ambulatory blood pressure monitoring encouraged        Patient Care Plan: RNCM:Diabetes Type 2 (Adult)  Completed 02/22/2021   Problem Identified: RNCM: Glycemic Management (Diabetes, Type 2) Resolved 02/22/2021  Priority: Medium  Note:   CARE PLAN ENTRY (see longtitudinal plan of care for additional care plan information)  Objective:  Lab Results  Component Value Date   HGBA1C 7.7 (H) 11/11/2019   Lab Results  Component Value Date   CREATININE 1.61 (H) 11/27/2019   CREATININE 1.77 (H) 11/11/2019   CREATININE 1.49 (H) 09/02/2019   No results found for: EGFR  Current Barriers:  Knowledge Deficits related to basic Diabetes pathophysiology and self care/management Knowledge Deficits related to medications used for management of diabetes  Case Manager Clinical Goal(s):  Over the next 120 days, patient will demonstrate improved adherence to prescribed treatment plan for diabetes self care/management as evidenced by:  daily monitoring and recording of CBG  adherence to ADA/ carb modified diet exercise 3/4 days/week adherence to prescribed medication regimen  Interventions:  Provided education to patient about basic DM disease process Reviewed medications with patient and discussed importance of medication adherence Discussed plans with patient for ongoing care management follow up and provided patient with direct contact information for care management team Provided patient with written educational materials related to  hypo and hyperglycemia and importance of correct treatment Reviewed scheduled/upcoming provider appointments including: 02-10-2020 at 2:20 pm Advised patient, providing education and rationale, to check cbg BID and record, calling pcp for findings outside established parameters.   Review of patient status, including review of consultants reports, relevant laboratory and other test results, and medications completed.  Patient Self Care Activities:  UNABLE to independently manage DM as evidence of hemoglobin A1C of 7.7 on 11-11-2019  Initial goal documentation     Long-Range Goal: RNCM: Glycemic Management Optimized Completed 02/22/2021  Start Date: 05/11/2020  Expected End Date: 08/28/2021  Recent Progress: On track  Priority: Medium  Note:   Objective: Resolving, duplicate goal  Lab Results  Component Value Date   HGBA1C 7.9 (H) 08/19/2020    Lab Results  Component Value Date   CREATININE 1.66 (H) 11/09/2020   CREATININE 1.62 (H) 11/02/2020   CREATININE 1.66 (H) 10/28/2020    No results found for: EGFR Current Barriers:  Knowledge Deficits related to basic Diabetes pathophysiology and self care/management Knowledge Deficits related to medications used for management of diabetes Unable to independently manage DM as evidence of elevated A1C at times, 10-12-2020: The patient's most recent hemoglobin A1C 7.9 Case Manager Clinical Goal(s):   patient will demonstrate improved adherence to prescribed treatment plan for diabetes self care/management as evidenced by:  daily monitoring and recording of CBG  adherence to ADA/ carb modified diet adherence to prescribed medication regimen Interventions:  Provided education to patient about basic DM disease process   Discussed plans with patient for ongoing care management follow up and provided patient with direct contact information for care management team Provided patient with written educational materials related to hypo and hyperglycemia and  importance of correct treatment Reviewed scheduled/upcoming provider appointments including: no upcoming appointment with pcp. Knows to call for changes or needs Advised patient, providing education and rationale, to check cbg TID and record, calling pcp for findings outside established parameters.  05-11-2020: Blood sugars elevated since taking prednisone. States they are stabilized.  Denies any blood sugars >200.  States readings recently 118-140. 10-12-2020: The patient states that her blood sugars have been up and down recently. She has a current UTI and believes the medications she is taking is causing her to have elevated blood sugars. States the highest she has had is 171. Education and support given. Will continue to monitor for changes. 12-28-2020: The patient states her blood sugars are up and down. The patient states that her blood sugar this am was 153.  States the range is 100 to 283. Knows when it is getting high. Extensive review of dietary restrictions and what the patient is eating. The patient has not been eating snacks. States that she thinks if she doesn't eat her blood sugars with go down. Education on how the body works with DM. Will send education information in the mail.  Review of patient status, including review of consultants reports, relevant laboratory and other test results, and medications completed. Patient Goals/Self-Care Activities , patient will:  - Checks blood sugars as prescribed and utilize hyper and hypoglycemia protocol as needed Adheres to prescribed ADA/carb modified Follow Up Plan:  Telephone follow up appointment with care management team member scheduled for: 02-22-2021 at 1030 am    Task: RNCM: Alleviate Barriers to Glycemic Management Completed 10/12/2020  Outcome: Positive  Note:   Care Management Activities:    - A1C testing facilitated - barriers to adherence to treatment plan identified - blood glucose monitoring encouraged - blood glucose readings  reviewed - mutual A1C goal set or reviewed - self-awareness of signs/symptoms of hypo or hyperglycemia encouraged - use of blood glucose monitoring log promoted    Notes: the patient is currently compliant with DM management     Patient Care Plan: RNCM: COPD (Adult)  Completed 02/22/2021   Problem Identified: RNCM: Psychological Adjustment to Diagnosis (COPD) Resolved 02/22/2021  Priority: Medium     Long-Range Goal: RNCM: COPD/Pneumonia/Lung nodule Completed 02/22/2021  Start Date: 05/11/2020  Expected End Date: 10/07/2021  Recent Progress: On track  Priority: Medium  Note:   Current Barriers: resolving, duplicate goal  Knowledge deficits related to basic understanding of COPD/pneumonia/ lung nodule disease process Knowledge deficits related to basic COPD/ lung nodule/pneumonia self care/management Knowledge deficit related to basic understanding of how to use inhalers and how inhaled medications work Knowledge deficit related to importance of energy conservation Unable to independently manage COPD/pneumonia and recent diagnosis of lung nodule Lacks social connections Unable to perform IADLs independently Does not contact provider office for questions/concerns  Case Manager Clinical Goal(s):  patient will report using inhalers as prescribed including rinsing mouth after use patient will be able to verbalize understanding of COPD action plan and when to seek appropriate levels of medical care patient will engage in lite exercise as tolerated to build/regain stamina and strength and reduce shortness of breath through activity tolerance  patient will verbalize basic understanding of COPD disease process and self care activities  patient will not be hospitalized for COPD exacerbation  as evidenced  Interventions:  Collaboration with Johnson, Megan P, DO regarding development and update of comprehensive plan of care as evidenced by provider attestation and  co-signature Inter-disciplinary care team collaboration (see longitudinal plan of care) Provided patient with basic written and verbal COPD/Pneumonia/Lung nodule education on self care/management/and exacerbation prevention  Provided patient with COPD /Pneumonia/Lung nodule action plan and reinforced importance of daily self assessment. 10-12-2020: The patient states that she is doing about the same with her COPD, has seen the pulmonologist recently and has not had any changes in the plan of care. Will continue to monitor for needs. 12-28-2020: Denies any issues with her COPD. States her respiratory status is stable. Will continue to monitor.  Provided written and verbal instructions on pursed lip breathing and utilized returned demonstration as teach back Provided instruction about proper use of medications used for management of COPD including inhalers Advised patient to self assesses COPD/Pneumonia/Lung nodule  action plan zone and make appointment with provider if in the yellow zone for 48 hours without improvement. Provided patient with education about the role of exercise in the management of COPD Advised patient to engage in light exercise as tolerated 3-5 days a week Provided education about and advised patient to utilize infection prevention strategies to reduce risk of respiratory infection  Patient Goals/Self-Care Activities:  - decision-making supported - depression screen reviewed - emotional support provided - family involvement promoted - problem-solving facilitated - relaxation techniques promoted - verbalization of feelings encouraged Follow Up Plan: Telephone follow up appointment with care management team member scheduled for: 02-22-2021 at 1030 am    Task: RNCM: Support Psychosocial Response to Chronic Obstructive Pulmonary Disease Completed 10/12/2020  Outcome: Positive  Note:   Care Management Activities:    - decision-making supported - depression screen reviewed -  emotional support provided - family involvement promoted - problem-solving facilitated - relaxation techniques promoted - verbalization of feelings encouraged      Patient Care Plan: RNCM; Fall Risk (Adult)  Completed 02/22/2021   Problem Identified: RNCM: Fall Risk Resolved 02/22/2021  Priority: High     Long-Range Goal: RNCM: Absence of Fall and Fall-Related Injury Completed 02/22/2021  Start Date: 05/11/2020  Expected End Date: 08/28/2021  Recent Progress: On track  Priority: High  Note:   Current Barriers: Resolving, duplicate goal  Knowledge Deficits related to fall precautions in patient with chronic pain, and several other chronic conditions impacting care  Decreased adherence to prescribed treatment for fall prevention Unable to perform IADLs independently Does not contact provider office for questions/concerns Chronic Disease Management support and education needs related to increased falls risk in patient with multiple chronic conditions  Clinical Goal(s):  patient will demonstrate improved adherence to prescribed treatment plan for decreasing falls as evidenced by patient reporting and review of EMR patient will verbalize using fall risk reduction strategies discussed patient will not experience additional falls patient will verbalize understanding of plan for effective management of falls prevention and safety in the home Interventions:  Collaboration with Johnson, Megan P, DO regarding development and update of comprehensive plan of care as evidenced by provider attestation and co-signature Inter-disciplinary care team collaboration (see longitudinal plan of care) Provided written and verbal education re: Potential causes of falls and Fall prevention strategies Reviewed medications and discussed potential side effects of medications such as dizziness and frequent urination Assessed for s/s of orthostatic hypotension Assessed for falls since last encounter. 10-12-2020:  The patient denies any new falls at this time. The patient states that   she is being very careful and monitoring her activity. Education and support given. Will continue to monitor. 12-28-2020: Denies any new falls at this time. Will continue to monitor. States she is being safe. Assessed patients knowledge of fall risk prevention secondary to previously provided education. Assessed working status of life alert bracelet and patient adherence Provided patient information for fall alert systems Evaluation of current treatment plan related to falls prevention and safety  and patient's adherence to plan as established by provider. 10-12-2020: The patient has a lot of health issues going on right now and is seeing several specialist. She is remaining optimistic and being safe in her home environment. Will continue to monitor. 12-28-2020: The patient is still dealing with a lot of back pain but she is stable and is being safe. Denies any new falls or safety concerns. Advised patient to call the office for new falls/changes in chronic conditions or questions Provided education to patient re: falls prevention and safety and monitoring for changes in conditions that may put patient at increase risk of falls Provided patient with falls prevention  educational materials related to preventing falls and being safe in home environment  Discussed plans with patient for ongoing care management follow up and provided patient with direct contact information for care management team Self-Care Deficits:  Lacks social connections Unable to perform IADLs independently Does not contact provider office for questions/concerns Patient Goals:  - Utilize walker and wheelchair (assistive device) appropriately with all ambulation - De-clutter walkways - Change positions slowly - Wear secure fitting shoes at all times with ambulation - Utilize home lighting for dim lit areas - Demonstrate self and pet awareness at all times -  activities of daily living skills assessed - assistive or adaptive device use encouraged - barriers to physical activity or exercise addressed - barriers to physical activity or exercise identified - barriers to safety identified - cognition assessed - cognitive-stimulating activities promoted - fall prevention plan reviewed and updated - fear of falling, loss of independence and pain acknowledged Follow Up Plan: Telephone follow up appointment with care management team member scheduled for: 02-22-2021 at 1030 am     Task: RNCM: Identify and Manage Contributors to Fall Risk Completed 10/12/2020  Outcome: Positive  Note:   Care Management Activities:    - activities of daily living skills assessed - assistive or adaptive device use encouraged - barriers to physical activity or exercise addressed - barriers to physical activity or exercise identified - barriers to safety identified - cognition assessed - cognitive-stimulating activities promoted - fall prevention plan reviewed and updated - fear of falling, loss of independence and pain acknowledged        Patient Care Plan: CCM Pharmacy Care plan     Problem Identified: DM, CKD,  HTN, HD, hypothyroidism, H/o PE/DVT, overactive bladder, CAD, PVD   Note:    Current Barriers:  Unable to independently afford treatment regimen Unable to independently monitor therapeutic efficacy  Pharmacist Clinical Goal(s):  Patient will pursue eliquis patient sistance through collaboration with PharmD and provider.   Interventions: 1:1 collaboration with [name], DO regarding development and update of comprehensive plan of care as evidenced by provider attestation and co-signature Inter-disciplinary care team collaboration (see longitudinal plan of care) Comprehensive medication review performed; medication list updated in electronic medical record  Diabetes (A1c goal <8%) Lab Results  Component Value Date   HGBA1C 8.5 (H)  09/08/2021   HGBA1C 9.6 (H) 06/08/2021   HGBA1C 8.0 (H) 02/15/2021  Lab Results  Component Value Date   MICROALBUR 150 (H) 08/19/2020   LDLCALC 56 09/08/2021   CREATININE 1.91 (H) 09/26/2021   Lab Results  Component Value Date   NA 141 09/26/2021   K 4.9 09/26/2021   CREATININE 1.91 (H) 09/26/2021   EGFR 33 (L) 09/21/2021   GFRNONAA 27 (L) 09/26/2021   GLUCOSE 149 (H) 09/26/2021   Lab Results  Component Value Date   WBC 8.4 09/21/2021   HGB 13.4 09/21/2021   HCT 41.7 09/21/2021   MCV 89 09/21/2021   PLT 291 09/21/2021   Lab Results  Component Value Date   LABMICR See below: 09/08/2021   LABMICR See below: 06/30/2021   MICROALBUR 150 (H) 08/19/2020   MICROALBUR 150 (H) 01/02/2019  -Not ideally controlled -GFRs stable in low 30s last jan dec nov - no change once off Jardiance -Current medications: Trulicity 4.5 mg once weekly Appropriate, Query effective,  Tresiba 30 units daily Appropriate, Query effective,  Glipizide 5 mg once daily Query Appropriate  Jardiance Appropriate, Query effective,  -Previous meds: metformin (GFR), , urology advised stopping d/t recurring UTI following pcp visit) -Current home glucose readings fasting glucose: typically 160-170, reports previous missed dose of tresiba leading to reading in upper 300s was very rare.  -Denies hypoglycemic/hyperglycemic symptoms. Only one episode that she can recall, has  -Current meal patterns: tries to be consistent with meals, avoids sweets. Husband often brining meals home.   -Current exercise: limited due to back/knee pain - leg still hurt after surgery. Getting in pool for water walking would likely be better on knee but fall risk is a concern  -Educated on A1c and blood sugar goals; -Counseled to check feet daily and get yearly eye exams -Recommended to continue current medication  COPD (Goal: control symptoms and prevent exacerbations) -Controlled -Current treatment  Trelegy once daily (started end  of  2022) Proair as needed -Gold Grade: Gold 2 (FEV1 50-79%) -Current COPD Classification:  A (low sx, <2 exacerbations/yr) -MMRC/CAT score: 9 (oct, 2022) -Pulmonary function testing: 2021 -Exacerbations requiring treatment in last 6 months: 0 -Patient denies consistent use of maintenance inhaler -Frequency of rescue inhaler use: 1-3x/wk -Counseled on Proper inhaler technique; When to use rescue inhaler -Recommended to continue current medication Assessed pt finances - see med assistance below.   Patient Goals/Self-Care Activities Patient will:  - take medications as prescribed  Medication Assistance: doing ok with current rx copays, husband is working variable hours still so uncertain about being able to qualify for PAP. If assistance is needed moving forward, will let us know. Programs that may be useful in future exist for Trelegy, Tresiba, Trulicity.   Patient's preferred pharmacy is:  [facility] - [place], [place] - [address] [place] [address] Phone: [phone] Fax: [phone]    Pt endorses 100% compliance  Follow Up:  Patient agrees to Care Plan and Follow-up. Plan: HC 1 month f/u on pap completion and DM. Pharmacist 3 month f/u     Long-Range Goal: disease management   Start Date: 12/13/2020  Expected End Date: 12/13/2021  Recent Progress: On track  Priority: High  Note:    Current Barriers:  Unable to independently afford treatment regimen Unable to independently monitor therapeutic efficacy  Pharmacist Clinical Goal(s):  Patient will pursue eliquis patient sistance through collaboration with PharmD and provider.   Interventions: 1:1 collaboration with Johnson, Megan P, DO regarding development and update of comprehensive plan of care as evidenced by   provider attestation and co-signature Inter-disciplinary care team collaboration (see longitudinal plan of care) Comprehensive medication review performed; medication list  updated in electronic medical record  Hypertension (BP goal <140/90) BP Readings from Last 3 Encounters:  10/18/21 109/70  10/13/21 138/80  09/26/21 132/63  -Controlled -Current treatment: Bisprolol Appropriate, Effective, Safe, Accessible Losartan (On medslist at [facility] but not Epic) -Medications previously tried: N/A  -Current home readings:  August 2023 Patient was laying on couch and didn't want to get up -Current dietary habits: "Tries to eat healthy" -Current exercise habits: None -Denies hypotensive/hypertensive symptoms -Educated on BP goals and benefits of medications for prevention of heart attack, stroke and kidney damage; -Counseled to monitor BP at home weekly, document, and provide log at future appointments August 2023: [facility] has patient on Losartan butisn't on Epic. Unable to see when Dc'd. Is indicated for kidney function but could have been Dc'd due to potassium. Will ask PCP  Diabetes (A1c goal <8%) Lab Results  Component Value Date   HGBA1C 8.5 (H) 09/08/2021   HGBA1C 9.6 (H) 06/08/2021   HGBA1C 8.0 (H) 02/15/2021   Lab Results  Component Value Date   MICROALBUR 150 (H) 08/19/2020   LDLCALC 56 09/08/2021   CREATININE 1.91 (H) 09/26/2021   Lab Results  Component Value Date   NA 141 09/26/2021   K 4.9 09/26/2021   CREATININE 1.91 (H) 09/26/2021   EGFR 33 (L) 09/21/2021   GFRNONAA 27 (L) 09/26/2021   GLUCOSE 149 (H) 09/26/2021   Lab Results  Component Value Date   WBC 8.4 09/21/2021   HGB 13.4 09/21/2021   HCT 41.7 09/21/2021   MCV 89 09/21/2021   PLT 291 09/21/2021   Lab Results  Component Value Date   LABMICR See below: 09/08/2021   LABMICR See below: 06/30/2021   MICROALBUR 150 (H) 08/19/2020   MICROALBUR 150 (H) 01/02/2019  -Not ideally controlled -GFRs stable in low 30s last jan dec nov - no change once off Jardiance -Current medications: Trulicity 4.5 mg once weekly Appropriate, Query effective,  Tresiba 30 units daily  Appropriate, Query effective,  Glipizide 5 mg once daily Appropriate, Query effective,  Jardiance 25mg Appropriate, Query effective,  -Previous meds: metformin (GFR), Jardiance 25 mg daily (held dec-jan 2023, urology advised stopping d/t recurring UTI following pcp visit) -Current home glucose readings fasting glucose: typically 160-170, reports previous missed dose of tresiba leading to reading in upper 300s was very rare.  -Denies hypoglycemic/hyperglycemic symptoms. Only one episode that she can recall, has  -Current meal patterns: tries to be consistent with meals, avoids sweets. Husband often brining meals home.   -Current exercise: limited due to back/knee pain - leg still hurt after surgery. Getting in pool for water walking would likely be better on knee but fall risk is a concern  -Educated on A1c and blood sugar goals; -Counseled to check feet daily and get yearly eye exams August 2023: Patient wasn't interested in getting up from the couch and getting her medications due to pain. Because of this, unable to talk about medications (She wasn't sure of what she was/wasn't taking). Recommend DC Glipizide and letting insulin take over  Hyperlipidemia: (LDL goal < 70) The 10-year ASCVD risk score (Arnett DK, et al., 2019) is: 36.9%   Values used to calculate the score:     Age: 76 years     Sex: Female     Is Non-Hispanic African American: No     Diabetic: Yes       Tobacco smoker: No     Systolic Blood Pressure: 063 mmHg     Is BP treated: Yes     HDL Cholesterol: 47 mg/dL     Total Cholesterol: 137 mg/dL Lab Results  Component Value Date   CHOL 137 09/08/2021   CHOL 161 02/15/2021   CHOL 152 08/19/2020   Lab Results  Component Value Date   HDL 47 09/08/2021   HDL 49 02/15/2021   HDL 53 08/19/2020   Lab Results  Component Value Date   LDLCALC 56 09/08/2021   LDLCALC 78 02/15/2021   LDLCALC 71 08/19/2020   Lab Results  Component Value Date   TRIG 208 (H) 09/08/2021    TRIG 204 (H) 02/15/2021   TRIG 165 (H) 08/19/2020   Lab Results  Component Value Date   CHOLHDL 3.2 09/07/2015   CHOLHDL 3.4 12/17/2014   CHOLHDL 4.2 12/31/2013  No results found for: "LDLDIRECT" -Controlled -Current treatment: Rosuvastatin 64m Appropriate, Effective, Safe, Accessible -Medications previously tried: n/a  -Current dietary patterns: "Tries to eat healthy" -Current exercise habits: none -Educated on Cholesterol goals;  -Recommended to continue current medication   COPD (Goal: control symptoms and prevent exacerbations) -Controlled -Current treatment  Breztri Appropriate, Effective, Safe, Accessible Proair as needed Appropriate, Effective, Safe, Accessible [place] Appropriate, Effective, Safe, Accessible -Gold Grade: Gold 2 (FEV1 50-79%) -Current COPD Classification:  A (low sx, <2 exacerbations/yr) -MMRC/CAT score: 9 (oct, 2022) -Pulmonary function testing: 2021 -Exacerbations requiring treatment in last 6 months: 0 -Patient denies consistent use of maintenance inhaler -Frequency of rescue inhaler use: 1-3x/wk -Counseled on Proper inhaler technique; When to use rescue inhaler -Recommended to continue current medication  Patient Goals/Self-Care Activities Patient will:  - take medications as prescribed  Medication Assistance:  -Patient stated she's content and doesn't require assistance paying for medications  [facility]'s [facility]:  [facility] [address]Phone: [phone]: [phone] Pt endorses 100% compliance  Follow Up:  Patient agrees to Care Plan and Follow-up. Plan: Patient was laying down, didn't want to get up to schedule  NArizona Constable Pharm.D. - [phone]      Patient Care Plan: RNCM: General Plan of Care (Adult) for Chronic Disease Management and Care Coordination Needs     Problem Identified: RNCM; Development of Plan of Care for  Chronic Disease Management (DM, CAD, COPD, HTN, HLD, chronic pain)   Priority: High     Long-Range Goal: RNCM: Effective Management of Plan of Care for Chronic Disease Management (DM, CAD, COPD, HTN, HLD, chronic pain)   Start Date: 02/22/2021  Expected End Date: 02/22/2022  Priority: High  Note:   Current Barriers:  Knowledge Deficits related to plan of care for management of CAD, HTN, HLD, COPD, DMII, and chronic pain   Chronic Disease Management support and education needs related to CAD, HTN, HLD, COPD, DMII, and Chronic pain   RNCM Clinical Goal(s):  Patient will verbalize basic understanding of CAD, HTN, HLD, COPD, DMII, and Chronic pain  disease process and self health management plan as evidenced by keeping appointments, taking medications, following dietary restrictions, calling the office for changes in conditions, questions or concerns take all medications exactly as prescribed and will call provider for medication related questions as evidenced by compliance with medications, and calling before needed refills     attend all scheduled medical appointments: 09-07-2021 at 10 am as evidenced by keeping appointments and calling for schedule change  needs         demonstrate improved and ongoing adherence to prescribed treatment plan for CAD, HTN, HLD, COPD, DMII, and Chronic pain  as evidenced by stable conditions and working with the CCM team to effectively manage chronic conditions  demonstrate a decrease in CAD, HTN, HLD, COPD, DMII, and Chronic pain  exacerbations  as evidenced by following the plan of care and keeping the pcp and CCM team updated to changes in condition demonstrate ongoing self health care management ability to effectively manage chronic conditions  as evidenced by working with the CCM team  through collaboration with Consulting civil engineer, provider, and care team.   Interventions: 1:1 collaboration with primary care provider regarding development and update of  comprehensive plan of care as evidenced by provider attestation and co-signature Inter-disciplinary care team collaboration (see longitudinal plan of care) Evaluation of current treatment plan related to  self management and patient's adherence to plan as established by provider   CAD  (Status: Goal on Track (progressing): YES.) Long Term Goal  BP Readings from Last 3 Encounters:  07/14/21 108/64  06/27/21 124/62  06/22/21 130/67    Lab Results  Component Value Date   CHOL 161 02/15/2021   HDL 49 02/15/2021   LDLCALC 78 02/15/2021   TRIG 204 (H) 02/15/2021   CHOLHDL 3.2 09/07/2015    Assessed understanding of CAD diagnosis. 08-16-2021: The patient has a good understanding of her cardiac issues and CAD. Sees specialist on a regular basis. Medications reviewed including medications utilized in CAD treatment plan. 08-16-2021: The patient is compliant with medications Provided education on importance of blood pressure control in management of CAD. 06-28-2021: The patient has better  control of her blood pressures. Did have issues at the end of March with elevations and chest pain. Is having a stress test tomorrow for further evaluation of recent onset of chest pain and discomfort. 08-16-2021: The patient is having better control of her blood pressures. She is mindful of changes and keeps close watch on her blood pressures.; Provided education on Importance of limiting foods high in cholesterol. 08-16-2021: The patient states that she is compliant with heart healthy/ADA diet ; Counseled on importance of regular laboratory monitoring as prescribed. 08-16-2021: The patient is compliant with regular lab work.  Counseled on the importance of exercise goals with target of 150 minutes per week. 08-16-2021: The patient is not able to do a lot of activity due to her chronic back pain. Reviewed Importance of taking all medications as prescribed. 08-16-2021: The patient takes her medications as prescribed. Reviewed  Importance of attending all scheduled provider appointments. 08-16-2021: Reminder given of upcoming appointment with Dr. Wynetta Emery on 09-07-2021 at 10 am Advised to report any changes in symptoms or exercise tolerance. 08-16-2021: unable to tolerate doing activity  COPD: (Status: Goal on Track (progressing): YES.) Long Term Goal  Reviewed medications with patient, including use of prescribed maintenance and rescue inhalers, and provided instruction on medication management and the importance of adherence. 08-16-2021: The patient is compliant with her medications  Provided patient with basic written and verbal COPD education on self care/management/and exacerbation prevention Advised patient to track and manage COPD triggers. 08-16-2021: The patient aware of things that trigger her COPD.  Provided written and verbal instructions on pursed lip breathing and utilized returned demonstration as teach back Provided instruction about proper use of medications used for management of COPD including inhalers Advised patient to self assesses COPD action plan zone and make appointment with provider  if in the yellow zone for 48 hours without improvement. 04-26-2021: The patient states she needs to get a follow up appointment with the pulmonary provider. She should have already gotten one but she hasn't. She has had several appointments. She states her breathing is not all that good but it is stable. Denies any acute findings related to pulmonary conditions. 06-28-2021: The patient was in the ER with several concerns the end of March. Saturations were low and blood sugars elevated. The patient was having pain and discomfort in her chest. Denies any issues with breathing at this time. Has had follow up appointment with her specialist and pcp on Thursday. 08-16-2021: The patient saw pulmonary provider recently and lungs were clear. The patient states that her COPD is stable at this time. She is having other concerns at this time. She  knows what to look for with changes of her COPD.  Advised patient to engage in light exercise as tolerated 3-5 days a week to aid in the the management of COPD Provided education about and advised patient to utilize infection prevention strategies to reduce risk of respiratory infection. 08-16-2021: The patient denies any issues with infection control. The patient is mindful of others who are sick and monitoring weather changes.  Discussed the importance of adequate rest and management of fatigue with COPD. 06-28-2021: The patient is having an issue with feeling "tired all the time". The patient states she just doesn't feel like doing a lot of things but pushes herself to do so. Education and support. Has upcoming appointments to discuss new concerns.  Screening for signs and symptoms of depression related to chronic disease state  Assessed social determinant of health barriers  Diabetes:  (Status: Goal on Track (progressing): YES.) Long Term Goal   Lab Results  Component Value Date   HGBA1C 9.6 (H) 06/08/2021  Previous 8.0 Assessed patient's understanding of A1c goal: <7%. 08-16-2021: The patient knows the goal of A1C is less than <7.0. Has had an exacerbation of her blood sugar readings recently.  Provided education to patient about basic DM disease process. 08-16-2021: Review with the patient and education provided.; Reviewed medications with patient and discussed importance of medication adherence. 08-16-2021: The patient is compliant with medications. Has been followed closely by pcp due to recent elevations in blood sugars and changes in medications. ;        Reviewed prescribed diet with patient heart healthy/ADA diet. 04-26-2021: The patient often skips meals and does not eat lunch. The patient states that she doesn't eat a lot. Discussed eating a light snack when she doesn't want to eat meals. 08-16-2021: The patient is eating well and denies any acute findings today. Counseled on importance of regular  laboratory monitoring as prescribed. 08-16-2021: The patient has regular lab testing        Discussed plans with patient for ongoing care management follow up and provided patient with direct contact information for care management team;      Provided patient with written educational materials related to hypo and hyperglycemia and importance of correct treatment. 02-22-2021: The patient had blood sugar up to 506 after taking prednisone for headache pain. The patient has completed prednisone and is having better blood sugars. This am her blood sugar was 161, and yesterday it was 114. 04-26-2021: States that her blood sugars are "all over the place" and most of the time under 200 but she would like to see them lower. Denies any real high elevations of blood sugars.  06-28-2021:   The patient states that she has been having elevated blood sugars. They are better and coming down and around 130's. A few weeks ago when in the ER her blood sugars were greater than 400. Other readings around 347, 365, and 235. Education and support provided. Has a follow up with the pcp on 06-30-2021 for evaluation further and recommendations.  08-16-2021: The patient denies any real highs or real lows. Knows how to effectively manage highs and lows.  Reviewed scheduled/upcoming provider appointments including: 09-07-2021 at 10 am ;         Advised patient, providing education and rationale, to check cbg before meals and at bedtime, when you have symptoms of low or high blood sugar, and before and after exercise and record. 02-22-2021: The patient has been checking blood sugars regularly. Has been having ups and downs due to prednisone for temporal headache pain. The patient states that she is better today and sees the specialist tomorrow. The patient denies any acute findings with DM at this time. 04-26-2021: The patient is having up and down readings. States that her blood sugars are staying below <200. Reviewed with the patient the goal of  fasting blood sugars of <130 and post prandial of <180. Education and support given. Will continue to monitor.  06-28-2021: Has been having fluctuations from 130's to >400. Being followed closely by pcp. 08-16-2021: The patient states that she is having better control of her blood sugars. Range has been 123 to 157.       call provider for findings outside established parameters. 08-16-2021: Education and support given to the patient. Review of calling the provider for blood sugars consistently <70 and > 200's;       Review of patient status, including review of consultants reports, relevant laboratory and other test results, and medications completed;       Advised patient to discuss Jardiance concern  with provider.  She was told by her nephrologist that Jardiance was not good for her kidneys. Advised the patient to discuss with pcp next week at her appointment.       Eye exam up to date. States she needs to have cataract surgery but has had issues with the eye doctor so she is planning on going to see a new one. (04-26-2021)  Hyperlipidemia:  (Status: Goal on Track (progressing): YES.) Long Term Goal  Lab Results  Component Value Date   CHOL 161 02/15/2021   HDL 49 02/15/2021   LDLCALC 78 02/15/2021   TRIG 204 (H) 02/15/2021   CHOLHDL 3.2 09/07/2015     Medication review performed; medication list updated in electronic medical record. 08-16-2021: is compliant with Rosuvastatin 40 mg QD Provider established cholesterol goals reviewed. 08-16-2021: The patient has good control of her cholesterol levels.  Counseled on importance of regular laboratory monitoring as prescribed. 08-16-2021: Has regular testing and is compliant with lab work; Provided HLD educational materials; Reviewed role and benefits of statin for ASCVD risk reduction; Discussed strategies to manage statin-induced myalgias; Reviewed importance of limiting foods high in cholesterol. 08-16-2021: The patient is mindful of heart healthy/ADA  diet; Reviewed exercise goals and target of 150 minutes per week;  Hypertension: (Status: Goal on Track (progressing): YES.) Last practice recorded BP readings:  BP Readings from Last 3 Encounters:  07/14/21 108/64  06/27/21 124/62  06/22/21 130/67  Most recent eGFR/CrCl:  Lab Results  Component Value Date   EGFR 34 (L) 03/23/2021    No components found for: CRCL  Evaluation of current   treatment plan related to hypertension self management and patient's adherence to plan as established by provider. 04-26-2021: The patient has good control of her blood pressures at this time. Denies any issues with HTN or heart health. 06-28-2021: The patient states that she has been having some fluctuations in her blood pressures along with new onset of chest pain. She had a stress test last week but could not complete the 3rd step due to the machine malfunctioning. She has to go back tomorrow to do a repeat stress test. She sates that she hates that she has to go through this again but it was not their fault that the machine malfunctioned. She states she has NTG to take for CP and did on 06-08-2021 when she went to her appointment with the kidney specialist and they called 911. She states that since then she has been having intermittent CP off and on. Was evaluated in the ER and had low oxygen saturation and elevated blood sugars. Education on making sure NTG was current and not expired. Review of sx and sx to monitor. Will continue to monitor for changes. 08-16-2021: The patient is doing better with blood pressure control. States that she is having no issues at this time with her blood pressures. Will continue to monitor.;   Provided education to patient re: stroke prevention, s/s of heart attack and stroke. 06-28-2021; Reviewed prescribed diet heart healthy/ADA diet. 08-16-2021: Review and education provided  Reviewed medications with patient and discussed importance of compliance. 08-16-2021: The patient is compliant  with medications;  Counseled on adverse effects of illicit drug and excessive alcohol use in patients with high blood pressure;  Counseled on the importance of exercise goals with target of 150 minutes per week Discussed plans with patient for ongoing care management follow up and provided patient with direct contact information for care management team; Advised patient, providing education and rationale, to monitor blood pressure daily and record, calling PCP for findings outside established parameters;  Provided education on prescribed diet heart healthy/ADA. 08-16-2021: Is compliant with dietary restrictions  Discussed complications of poorly controlled blood pressure such as heart disease, stroke, circulatory complications, vision complications, kidney impairment, sexual dysfunction;   Pain:  (Status: Goal on Track (progressing): YES.) Long Term Goal  Pain assessment performed. 02-22-2021: The patient rates her head/temporal pain at 3 to 4 today. Has chronic back and knee pain also. The patient denies any acute findings. Is going to specialist tomorrow to have further evaluation of temporal pain that has been going on for about 2 weeks in addition to other chronic pain areas. 04-26-2021: The patient is having back pain when she walks at a 10. Today she is even having it when she sits down. The patient states that she is also having stomach pain and rates it at a 7 today on a scale of 0-10. She states she was doing PT to help with ambulation and back pain but it was only making it worse. She does not think she is going back. Education and support given. 06-28-2021: The patient states that her pain today is at a 2 on a scale of 0-10 in her left breast arm area. She has been having shooting pains in her breast and arm and will have a mammogram on 07-18-2021. Education and support given. 08-16-2021: The patient is having back pain and now is having pain in the back of her left breast coming around to the nipple.  She saw general surgeon and had a mammogram with no   acute findings. She rates this pain at a 6 today. She states that something has to be going on because sometimes the pain shoots through her left nipple. Education on writing down questions to ask the pcp at upcoming visit. The patient states she may need to come in sooner if the pain gets worse. Education and support given.  Medications reviewed. 08-16-2021: Education and patient is compliant with medications Reviewed provider established plan for pain management. 08-16-2021: The patient is taking Tylenol and using Voltaren gel with a heating pad with some relief. She states that she is having the pain wake her up from her sleep. Education and support given.  Discussed importance of adherence to all scheduled medical appointments. 08-16-2021: sees the pcp on 09-07-2021 at 10 am Counseled on the importance of reporting any/all new or changed pain symptoms or management strategies to pain management provider; Advised patient to report to care team affect of pain on daily activities; Discussed use of relaxation techniques and/or diversional activities to assist with pain reduction (distraction, imagery, relaxation, massage, acupressure, TENS, heat, and cold application; Reviewed with patient prescribed pharmacological and nonpharmacological pain relief strategies; Advised patient to discuss unresolved pain, changes in level or intensity of pain  with provider;   Recurrent UTI's  (Status: Goal on Track (progressing): YES.) Long Term Goal  Evaluation of current treatment plan related to  recurrent UTI's ,  self-management and patient's adherence to plan as established by provider. 08-16-2021: Is stable at this time. Knows when to call the provider. Has been taking antibiotics on a regular basis.  Discussed plans with patient for ongoing care management follow up and provided patient with direct contact information for care management team Advised patient to write  down questions to ask the urologist on her appointment for Thursday 06-30-2021. The patient is concerned that she is having so many issues with recurrent UTI's. The patient states the urologist told her that her right kidney may need to be removed.; Provided education to patient re: sx and sx of UTI, good hygiene, taking medications as ordered, discussing concerns about her urinary health with provider and working with the CCM team for ongoing support and education for urinary health and other chronic conditions. ; Provided patient with UTI and kidney health educational materials related to recurrent UTI's and questions about nephrectomy through the my chart system; Reviewed scheduled/upcoming provider appointments including appointment with the pcp on 09-07-2021 at 10 am; Discussed plans with patient for ongoing care management follow up and provided patient with direct contact information for care management team; Advised patient to discuss her fears, questions and concerns about recurrent UTI's and the possible removal of her right kidney with provider; Screening for signs and symptoms of depression related to chronic disease state;  Assessed social determinant of health barriers;    Patient Goals/Self-Care Activities: Take medications as prescribed   Attend all scheduled provider appointments Call pharmacy for medication refills 3-7 days in advance of running out of medications schedule appointment with eye doctor check blood sugar at prescribed times: before meals and at bedtime, when you have symptoms of low or high blood sugar, and before and after exercise check feet daily for cuts, sores or redness enter blood sugar readings and medication or insulin into daily log take the blood sugar log to all doctor visits take the blood sugar meter to all doctor visits trim toenails straight across drink 6 to 8 glasses of water each day eat fish at least once per week fill half   of plate with  vegetables manage portion size keep feet up while sitting wash and dry feet carefully every day wear comfortable, cotton socks wear comfortable, well-fitting shoes - avoid second hand smoke - eliminate smoking in my home - identify and avoid work-related triggers - identify and remove indoor air pollutants - limit outdoor activity during cold weather - listen for public air quality announcements every day - do breathing exercises every day - eliminate symptom triggers at home - follow rescue plan if symptoms flare-up - use an extra pillow to sleep - develop a new routine to improve sleep - don't eat or exercise right before bedtime - eat healthy/prescribed diet: heart healthy/ADA - get at least 7 to 8 hours of sleep at night - use devices that will help like a cane, sock-puller or reacher - practice relaxation or meditation daily - do exercises in a comfortable position that makes breathing as easy as possible check blood pressure daily choose a place to take my blood pressure (home, clinic or office, retail store) write blood pressure results in a log or diary learn about high blood pressure keep a blood pressure log take blood pressure log to all doctor appointments call doctor for signs and symptoms of high blood pressure develop an action plan for high blood pressure keep all doctor appointments take medications for blood pressure exactly as prescribed begin an exercise program report new symptoms to your doctor eat more whole grains, fruits and vegetables, lean meats and healthy fats - call for medicine refill 2 or 3 days before it runs out - take all medications exactly as prescribed - call doctor with any symptoms you believe are related to your medicine - call doctor when you experience any new symptoms - go to all doctor appointments as scheduled       Ms. Suddeth was given information about Chronic Care Management services today including:  CCM service includes  personalized support from designated clinical staff supervised by her physician, including individualized plan of care and coordination with other care providers 24/7 contact phone numbers for assistance for urgent and routine care needs. Standard insurance, coinsurance, copays and deductibles apply for chronic care management only during months in which we provide at least 20 minutes of these services. Most insurances cover these services at 100%, however patients may be responsible for any copay, coinsurance and/or deductible if applicable. This service may help you avoid the need for more expensive face-to-face services. Only one practitioner may furnish and bill the service in a calendar month. The patient may stop CCM services at any time (effective at the end of the month) by phone call to the office staff.  Patient agreed to services and verbal consent obtained.   The patient verbalized understanding of instructions, educational materials, and care plan provided today and DECLINED offer to receive copy of patient instructions, educational materials, and care plan.  The pharmacy team will reach out to the patient again over the next 30 days.   Nathan K Kennedy, RPH [phone]  

## 2021-10-24 NOTE — Progress Notes (Signed)
Chronic Care Management Pharmacy Note  10/24/2021 Name:  Tammy [name] MRN:  [id number] DOB:  Feb 22, 1946   Summary: Pleasant 76 year old female presents for f/u CCM visit. She enjoys traveling with her spouse but is unable to do so currently due to pain. Her favorite trip she's been on was [place] but she also enjoyed her trip to [place]. She also enjoyed going water skiing, her and her husband have a house [place] the beach in [place].    Recommendations to PCP: [facility] has patient on Losartan butisn't on Epic. Unable to see when Dc'd. Is indicated for kidney function but could have been Dc'd due to potassium. Will ask PCP Patient on both insulin and glipizide. Recommend Dc'ing Glipizde and replacing with short term insulin. Sulfonylurea and insulin contraindicated together   Subjective: Tammy [name] is an 76 y.o. year old female who is a primary patient of [name], DO.  The CCM team was consulted for assistance with disease management and care coordination needs.    Engaged with patient by telephone for follow up visit in response to provider referral for pharmacy case management and/or care coordination services.   Consent to Services:  The patient was given information about Chronic Care Management services, agreed to services, and gave verbal consent prior to initiation of services.  Please see initial visit note for detailed documentation.   Patient Care Team: [name], DO as PCP - General (Family Medicine) [name], MD as PCP - Cardiology (Cardiology) [name], MD as Consulting Physician (Cardiology) [name], MD as Consulting Physician (Urology) [name], MD as Consulting Physician (Nephrology) [name], MD as Referring Physician (Orthopedic Surgery) [name], MD as Attending Physician (Endocrinology) [name], MD as Referring Physician (Vascular Surgery) [name], RN as Case Manager (General Practice) Lane Hacker, [facility] (Pharmacist)  Hospital visits: None in previous 6 months  Objective:  Lab Results  Component Value Date   CREATININE 1.91 (H) 09/26/2021   CREATININE 1.61 (H) 09/21/2021   CREATININE 1.66 (H) 09/09/2021    Lab Results  Component Value Date   HGBA1C 8.5 (H) 09/08/2021   HGBA1C 9.6 (H) 06/08/2021   HGBA1C 8.0 (H) 02/15/2021   Last diabetic Eye exam:  Lab Results  Component Value Date/Time   HMDIABEYEEXA No Retinopathy 11/09/2020 12:00 AM    Last diabetic Foot exam: No results found for: "HMDIABFOOTEX"      Component Value Date/Time   CHOL 137 09/08/2021 0924   CHOL 161 02/15/2021 1019   CHOL 152 08/19/2020 1015   CHOL 152 01/31/2016 1419   TRIG 208 (H) 09/08/2021 0924   TRIG 204 (H) 02/15/2021 1019   TRIG 165 (H) 08/19/2020 1015   TRIG 224 (H) 01/31/2016 1419   HDL 47 09/08/2021 0924   HDL 49 02/15/2021 1019   HDL 53 08/19/2020 1015   CHOLHDL 3.2 09/07/2015 0805   CHOLHDL 3.4 12/17/2014 1017   VLDL 45 (H) 01/31/2016 1419   [place] 56 09/08/2021 0924   LDLCALC 78 02/15/2021 1019   LDLCALC 71 08/19/2020 1015       Latest Ref Rng & Units 09/08/2021    9:24 AM 06/27/2021    1:25 PM 06/08/2021    7:04 PM  Hepatic Function  Total Protein 6.0 - 8.5 g/dL 6.9  6.7  8.2   Albumin 3.7 - 4.7 g/dL 4.2  3.4  3.9   AST 0 - 40 IU/L 20  20  24   ALT 0 - 32 IU/L 17  18  18    Alk Phosphatase 44 - 121 IU/L 88  82  91   Total Bilirubin 0.0 - 1.2 mg/dL 0.3  0.6  0.9   Bilirubin, Direct 0.0 - 0.2 mg/dL   0.1     Lab Results  Component Value Date/Time   TSH 0.510 03/23/2021 11:27 AM   TSH 0.865 11/11/2019 02:24 PM       Latest Ref Rng & Units 09/21/2021   10:30 AM 09/08/2021    9:24 AM 06/27/2021    1:25 PM  CBC  WBC 3.4 - 10.8 x10E3/uL 8.4  8.2  8.1   Hemoglobin 11.1 - 15.9 g/dL 13.4  13.2  12.5   Hematocrit 34.0 - 46.6 % 41.7  42.5  40.1   Platelets 150 - 450 x10E3/uL 291  260  248     Lab Results  Component Value Date/Time   VD25OH 32.6 04/08/2019  04:38 PM    Clinical ASCVD:  The 10-year ASCVD risk score (Arnett DK, et al., 2019) is: 36.9%   Values used to calculate the score:     Age: 54 years     Sex: Female     Is Non-Hispanic African American: No     Diabetic: Yes     Tobacco smoker: No     Systolic Blood Pressure: 597 mmHg     Is BP treated: Yes     HDL Cholesterol: 47 mg/dL     Total Cholesterol: 137 mg/dL   Social History   Tobacco Use  Smoking Status Former   Packs/day: 0.50   Years: 15.00   Total pack years: 7.50   Types: Cigarettes   Quit date: 03/14/2007   Years since quitting: 14.6  Smokeless Tobacco Never   BP Readings from Last 3 Encounters:  10/18/21 109/70  10/13/21 138/80  09/26/21 132/63   Pulse Readings from Last 3 Encounters:  10/18/21 78  10/13/21 74  09/26/21 71   Wt Readings from Last 3 Encounters:  10/18/21 217 lb 1.6 oz (98.5 kg)  10/13/21 215 lb 14.4 oz (97.9 kg)  09/26/21 217 lb (98.4 kg)   BMI Readings from Last 3 Encounters:  10/18/21 35.04 kg/m  10/13/21 34.85 kg/m  09/26/21 35.02 kg/m    CAT ASSESSMENT  Rank each of the following items on a scale of 0 to 5 (with 5 being most severe) Write a # 0-5 in each box  I never cough (0) > I cough all the time (5) 0  I have no phlegm (mucus) in my chest (0) > My chest is completely full of phlegm (mucus) (5) 0  My chest does not feel tight at all (0) > My chest feels very tight (5) 0  When I walk up a hill or one flight of stairs I am not breathless (0) > When I walk up a hill or one flight of stairs I am very breathless (5) 5  I am not limited doing any activities at home (0) > I am very limited doing activities at home (5) 0  I am confident leaving my home despite my lung function (0) > I am not at all confident leaving my home because of my lung condition (5)  0  I sleep soundly (0) > I don't sleep soundly because of my lung condition (5) 0  I have lots of energy (0) > I have no energy at all (5) 4   Total  CAT Score:  9 Assessment: Review of patient past medical history, allergies, medications, health status, including review of consultants reports, laboratory and other test data, was performed as part of comprehensive evaluation and provision of chronic care management services.   SDOH:  (Social Determinants of Health) assessments and interventions performed: Yes SDOH Interventions    Flowsheet Row Most Recent Value  SDOH Interventions   Financial Strain Interventions Intervention Not Indicated  Transportation Interventions Intervention Not Indicated       CCM Care Plan  Allergies  Allergen Reactions   Hydrocodone Other (See Comments)    Unresponsive   Aleve [Naproxen Sodium]     Hives & itching    Contrast Media [Iodinated Contrast Media]     Pt avoids due to kidney issues   Ioxaglate Other (See Comments)    (iodine) Pt avoids due to kidney issues    Oxycodone Other (See Comments)    hallucinations   Ranexa [Ranolazine]     Sick on stomach   Sulfa Antibiotics Other (See Comments)    Pt avoids due to kidney issues   Tramadol Other (See Comments)    nightmares    Medications Reviewed Today     Reviewed by Lane Hacker, [facility] (Pharmacist) on 10/24/21 at [place] List Status: <None>   Medication Order Taking? Sig Documenting Provider Last Dose Status Informant  albuterol (PROVENTIL) (2.5 MG/3ML) 0.083% nebulizer solution [id number] No Take 3 mLs (2.5 mg total) by nebulization every 6 (six) hours as needed for wheezing or shortness of breath. Chesley Mires, MD Taking Active   albuterol (VENTOLIN HFA) 108 (90 Base) MCG/ACT inhaler [id number] No Inhale 2 puffs into the lungs every 6 (six) hours as needed for wheezing or shortness of breath. [name], DO Taking Active Other, Pharmacy Records, Multiple Informants, Self  anastrozole (ARIMIDEX) 1 MG tablet [id number] No TAKE 1 TABLET BY MOUTH DAILY Cammie Sickle, MD Taking Active   apixaban (ELIQUIS) 2.5 MG TABS tablet  [id number] No Take 1 tablet (2.5 mg total) by mouth 2 (two) times daily. [name], DO Taking Active Other, Pharmacy Records, Multiple Informants, Self  bisoprolol (ZEBETA) 10 MG tablet [id number] No Take 1 tablet (10 mg total) by mouth 2 (two) times daily. Enzo Bi, MD Taking Active   blood glucose meter kit and supplies KIT [id number] No Dispense based on patient and insurance preference. Use one time daily as directed, E11.9 Lada, Satira Anis, MD Taking Active Other, Pharmacy Records, Multiple Informants, Self  Budeson-Glycopyrrol-Formoterol (BREZTRI AEROSPHERE) 160-9-4.8 MCG/ACT AERO [id number] No Inhale 2 puffs into the lungs in the morning and at bedtime. Martyn Ehrich, NP Taking Active            Med Note Marily Lente   Mon Sep 26, 2021  9:27 AM) Has not started yet   Calcium Carb-Cholecalciferol (CALCIUM 600 + D) 600-200 MG-UNIT TABS [id number] No Take 1 tablet by mouth daily.  [provider] Taking Active Other, Pharmacy Records, Multiple Informants, Self  cholecalciferol (VITAMIN D3) 25 MCG (1000 UT) tablet [id number] No Take 1,000 Units by mouth daily. [provider] Taking Active Other, Pharmacy Records, Multiple Informants, Self  ciprofloxacin (CIPRO) 500 MG tablet [id number] No Take 1 tablet (500 mg total) by mouth 2 (two) times daily. [name], DO Taking Active   CRANBERRY PO [id number] No Take 2 capsules by mouth daily. [provider] Taking Active Other, Pharmacy Records, Multiple Informants, Self  cyclobenzaprine (FLEXERIL) 5 MG tablet [id number]  No Take 1 tablet (5 mg total) by mouth 3 (three) times daily as needed for muscle spasms. Charlynne Cousins, MD Taking Active   DULoxetine (CYMBALTA) 60 MG capsule [id number] No TAKE 2 CAPSULES BY MOUTH EVERY DAY Johnson, Megan P, DO Taking Active   fluticasone (FLONASE) 50 MCG/ACT nasal spray [id number] No Place 1 spray into both nostrils daily. Chesley Mires, MD Taking Active   gabapentin (NEURONTIN) 400  MG capsule [id number]  Take 1 capsule (400 mg total) by mouth 3 (three) times daily. Johnson, Megan P, DO  Active   glipiZIDE (GLUCOTROL) 5 MG tablet [id number] No TAKE 1 TABLET BY MOUTH DAILY BEFORE BREAKFAST Johnson, Megan P, DO Taking Active Other, Pharmacy Records, Multiple Informants, Self  insulin degludec (TRESIBA FLEXTOUCH) 100 UNIT/ML FlexTouch Pen [id number] No Inject 38-40 Units into the skin daily. Park Liter P, DO Taking Active   Insulin Pen Needle (PEN NEEDLES 3/16") 31G X 5 MM MISC [id number] No 1 each by Does not apply route daily. [name], DO Taking Active Other, Pharmacy Records, Multiple Informants, Self  isosorbide mononitrate (IMDUR) 60 MG 24 hr tablet [id number] No TAKE ONE TABLET BY MOUTH AT BEDTIME Johnson, Megan P, DO Taking Active   JARDIANCE 25 MG TABS tablet [id number] No Take 25 mg by mouth every morning. [provider] Taking Active   Lactobacillus (PROBIOTIC ACIDOPHILUS PO) [id number] No Take 1 capsule by mouth daily at 12 noon. [provider] Taking Active Other, Pharmacy Records, Multiple Informants, Self  lidocaine (LIDODERM) 5 % [id number] No Place 1 patch onto the skin daily. Remove & Discard patch within 12 hours or as directed by MD [name], DO Taking Active   montelukast (SINGULAIR) 10 MG tablet [id number] No TAKE 1 TABLET BY MOUTH AT BEDTIME Johnson, Megan P, DO Taking Active   MYRBETRIQ 50 MG TB24 tablet [id number] No TAKE 1 TABLET BY MOUTH DAILY Vaillancourt, Samantha, PA-C Taking Active Other, Pharmacy Records, Multiple Informants, Self  nitrofurantoin, macrocrystal-monohydrate, (MACROBID) 100 MG capsule [id number] No Take 100 mg by mouth daily. [provider] Taking Active   nitroGLYCERIN (NITROSTAT) 0.4 MG SL tablet [id number] No Place 1 tablet (0.4 mg total) under the tongue every 5 (five) minutes as needed for chest pain. Total of 3 doses [name], MD Taking Active   oxyCODONE-acetaminophen (PERCOCET/ROXICET)  5-325 MG tablet [id number]  Take 0.5 tablets by mouth every 8 (eight) hours as needed for severe pain. Johnson, Megan P, DO  Active   pantoprazole (PROTONIX) 40 MG tablet [id number] No Take 1 tablet (40 mg total) by mouth daily. [name], DO Taking Active Other, Pharmacy Records, Multiple Informants, Self  Pumpkin Seed-Soy Germ (AZO BLADDER CONTROL/GO-LESS PO) [id number] No Take 1 tablet by mouth in the morning and at bedtime. [provider] Taking Active Other, Pharmacy Records, Multiple Informants, Self  rosuvastatin (CRESTOR) 40 MG tablet [id number] No TAKE 1 TABLET BY MOUTH DAILY Johnson, Megan P, DO Taking Active   senna (SENOKOT) 8.6 MG TABS tablet [id number] No Take 8.6 mg by mouth daily as needed for mild constipation. [provider] Taking Active Other, Pharmacy Records, Multiple Informants, Self  SYNTHROID 150 MCG tablet [id number] No TAKE ONE TABLET ON AN EMPTY STOMACH WITHA GLASS OF WATER AT LEAST 30 TO 60 MINUTES BEFORE BREAKFAST Johnson, Barb Merino, DO Taking Active Other, Pharmacy Records, Multiple Informants, Self  trimethoprim (TRIMPEX) 100 MG tablet [id number] No TAKE ONE TABLET BY MOUTH EVERY DAY Jon Billings, NP Taking Active   TRULICITY 4.5  MG/0.5ML SOPN [id number] No SMARTSIG:4.5 Milligram(s) SUB-Q Once a Week [provider] Taking Active Other, Pharmacy Records, Multiple Informants, Self  UNABLE TO FIND [id number] No Med Name: Energy, memory and mood enhancer [provider] Taking Active Other, Pharmacy Records, Multiple Informants, Self  ZINC CITRATE PO [id number] No Take 1 tablet by mouth daily. [provider] Taking Active Other, Pharmacy Records, Multiple Informants, Self            Patient Active Problem List   Diagnosis Date Noted   Osteoarthritis of spine with radiculopathy, cervical region 10/20/2021   Osteoarthritis of spine with radiculopathy, thoracic region 10/20/2021   Acute respiratory failure with hypoxia  (HCC) 06/09/2021   Chronic anticoagulation 06/09/2021   Elevated lipase 06/09/2021   Stage 3b chronic kidney disease ([place]) 06/08/2021   Intermittent claudication ([place]) 02/15/2021   Cervical radiculopathy 10/14/2020   Sacroiliac inflammation ([place]) 10/14/2020   Lung nodule 08/19/2020   Fall 04/17/2020   Anemia 02/16/2020   Asthma 02/16/2020   History of cardiac catheterization 02/16/2020   History of left mastectomy 02/16/2020   Malignant tumor of breast ([place]) 02/16/2020   History of total knee arthroplasty 11/18/2019   Unstable angina ([place]) 04/03/2019   Dysphagia    Thickening of esophagus    Stomach irritation    Gastric polyp    Positive occult stool blood test    Polyp of colon    Hyperkalemia 11/27/2018   Proteinuria 11/27/2018   Type 2 diabetes mellitus with stage 3 chronic kidney disease ([place]) 11/27/2018   Carcinoma of lower-inner quadrant of left breast in female, estrogen receptor positive ([place]) 10/02/2018   Right knee pain 08/01/2017   Chronic right-sided low back pain with right-sided sciatica 05/28/2017   Gait abnormality 05/28/2017   Hypothyroid 07/18/2016   COPD (chronic obstructive pulmonary disease) ([place]) 07/18/2016   Encephalomalacia on imaging study 12/10/2015   Calcification of both carotid arteries 12/10/2015   Benign neoplasm of sigmoid colon    Benign neoplasm of cecum    Benign neoplasm of ascending colon    Bilateral atelectasis 04/11/2015   Fatty liver disease, nonalcoholic 99/37/1696   Hydronephrosis of right kidney 04/11/2015   Abdominal aortic ectasia ([place]) 04/11/2015   Constipation 04/07/2015   Abdominal aortic atherosclerosis ([place]) 12/18/2014   Diverticulosis of colon 12/18/2014   Intermittent left-sided chest pain 11/18/2014   Rhinitis, allergic 10/20/2014   Coronary artery disease    Essential hypertension    Morbid obesity due to excess calories ([place])    Spinal stenosis of lumbar region 06/06/2014   Herniated nucleus pulposus 06/06/2014    Neuralgia neuritis, sciatic nerve 05/24/2014   Pulmonary embolism ([place]) 01/30/2014   Benign hypertensive renal disease 12/16/2013   Uncontrolled type 2 diabetes mellitus with hyperglycemia, with long-term current use of insulin ([place]) 12/16/2013   Overactive bladder 11/03/2013   Coronary atherosclerosis of native coronary artery 06/19/2013   Exertional dyspnea 06/19/2013   History of back surgery 06/19/2013   Hyperlipidemia 06/19/2013   PAD (peripheral artery disease) ([place]) 06/19/2013   Congenital obstruction of ureteropelvic junction 11/21/2011    Immunization History  Administered Date(s) Administered   Fluad Quad(high Dose 65+) 01/02/2019, 02/10/2020, 12/30/2020   Influenza, High Dose Seasonal PF 11/16/2016, 12/18/2017   Influenza,inj,Quad PF,6+ Mos 12/16/2014, 11/24/2015   PFIZER(Purple Top)SARS-COV-2 Vaccination 05/08/2019, 05/29/2019   Pneumococcal Conjugate-13 07/08/2014   Pneumococcal Polysaccharide-23 03/13/2012   Zoster, Live 03/13/2010    Conditions to be addressed/monitored: T2DM, chronic pain, frequent UTIs, COPD, HTN  Care Plan :  CCM Pharmacy Care plan  Updates made by Lane Hacker, [facility] since 10/24/2021 12:00 AM     Problem: DM, CKD,  HTN, HD, hypothyroidism, H/o PE/DVT, overactive bladder, CAD, PVD   Note:    Current Barriers:  Unable to independently afford treatment regimen Unable to independently monitor therapeutic efficacy  Pharmacist Clinical Goal(s):  Patient will pursue eliquis patient sistance through collaboration with PharmD and provider.   Interventions: 1:1 collaboration with [name], DO regarding development and update of comprehensive plan of care as evidenced by provider attestation and co-signature Inter-disciplinary care team collaboration (see longitudinal plan of care) Comprehensive medication review performed; medication list updated in electronic medical record  Diabetes (A1c goal <8%) Lab Results  Component Value  Date   HGBA1C 8.5 (H) 09/08/2021   HGBA1C 9.6 (H) 06/08/2021   HGBA1C 8.0 (H) 02/15/2021   Lab Results  Component Value Date   MICROALBUR 150 (H) 08/19/2020   LDLCALC 56 09/08/2021   CREATININE 1.91 (H) 09/26/2021   Lab Results  Component Value Date   NA 141 09/26/2021   K 4.9 09/26/2021   CREATININE 1.91 (H) 09/26/2021   EGFR 33 (L) 09/21/2021   GFRNONAA 27 (L) 09/26/2021   GLUCOSE 149 (H) 09/26/2021   Lab Results  Component Value Date   WBC 8.4 09/21/2021   HGB 13.4 09/21/2021   HCT 41.7 09/21/2021   MCV 89 09/21/2021   PLT 291 09/21/2021   Lab Results  Component Value Date   LABMICR See below: 09/08/2021   LABMICR See below: 06/30/2021   MICROALBUR 150 (H) 08/19/2020   MICROALBUR 150 (H) 01/02/2019  -Not ideally controlled -GFRs stable in low 30s last jan dec nov - no change once off Jardiance -Current medications: Trulicity 4.5 mg once weekly Appropriate, Query effective,  Tresiba 30 units daily Appropriate, Query effective,  Glipizide 5 mg once daily Query Appropriate  Jardiance Appropriate, Query effective,  -Previous meds: metformin (GFR), , urology advised stopping d/t recurring UTI following pcp visit) -Current home glucose readings fasting glucose: typically 160-170, reports previous missed dose of tresiba leading to reading in upper 300s was very rare.  -Denies hypoglycemic/hyperglycemic symptoms. Only one episode that she can recall, has  -Current meal patterns: tries to be consistent with meals, avoids sweets. Husband often brining meals home.   -Current exercise: limited due to back/knee pain - leg still hurt after surgery. Getting in pool for water walking would likely be better on knee but fall risk is a concern  -Educated on A1c and blood sugar goals; -Counseled to check feet daily and get yearly eye exams -Recommended to continue current medication  COPD (Goal: control symptoms and prevent exacerbations) -Controlled -Current treatment  Trelegy  once daily (started end of  2022) Proair as needed -Gold Grade: Gold 2 (FEV1 50-79%) -Current COPD Classification:  A (low sx, <2 exacerbations/yr) -MMRC/CAT score: 9 (oct, 2022) -Pulmonary function testing: 2021 -Exacerbations requiring treatment in last 6 months: 0 -Patient denies consistent use of maintenance inhaler -Frequency of rescue inhaler use: 1-3x/wk -Counseled on Proper inhaler technique; When to use rescue inhaler -Recommended to continue current medication Assessed pt finances - see med assistance below.   Patient Goals/Self-Care Activities Patient will:  - take medications as prescribed  Medication Assistance: doing ok with current rx copays, husband is working variable hours still so uncertain about being able to qualify for PAP. If assistance is needed moving forward, will let us know. Programs that may be useful in future exist for  Trelegy, Tyler Aas, Trulicity.   Patient's preferred pharmacy is:  [facility] - [place], [place] - [address] [place] [address] Phone: [phone] Fax: [phone]    Pt endorses 100% compliance  Follow Up:  Patient agrees to Care Plan and Follow-up. Plan: HC 1 month f/u on pap completion and DM. Pharmacist 3 month f/u     Long-Range Goal: disease management   Start Date: 12/13/2020  Expected End Date: 12/13/2021  Recent Progress: On track  Priority: High  Note:    Current Barriers:  Unable to independently afford treatment regimen Unable to independently monitor therapeutic efficacy  Pharmacist Clinical Goal(s):  Patient will pursue eliquis patient sistance through collaboration with PharmD and provider.   Interventions: 1:1 collaboration with [name], DO regarding development and update of comprehensive plan of care as evidenced by provider attestation and co-signature Inter-disciplinary care team collaboration (see longitudinal plan of care) Comprehensive medication review  performed; medication list updated in electronic medical record  Hypertension (BP goal <140/90) BP Readings from Last 3 Encounters:  10/18/21 109/70  10/13/21 138/80  09/26/21 132/63  -Controlled -Current treatment: Bisprolol Appropriate, Effective, Safe, Accessible Losartan (On medslist at [facility] but not Epic) -Medications previously tried: N/A  -Current home readings:  August 2023 Patient was laying on couch and didn't want to get up -Current dietary habits: "Tries to eat healthy" -Current exercise habits: None -Denies hypotensive/hypertensive symptoms -Educated on BP goals and benefits of medications for prevention of heart attack, stroke and kidney damage; -Counseled to monitor BP at home weekly, document, and provide log at future appointments August 2023: [facility] has patient on Losartan butisn't on Epic. Unable to see when Dc'd. Is indicated for kidney function but could have been Dc'd due to potassium. Will ask PCP  Diabetes (A1c goal <8%) Lab Results  Component Value Date   HGBA1C 8.5 (H) 09/08/2021   HGBA1C 9.6 (H) 06/08/2021   HGBA1C 8.0 (H) 02/15/2021   Lab Results  Component Value Date   MICROALBUR 150 (H) 08/19/2020   LDLCALC 56 09/08/2021   CREATININE 1.91 (H) 09/26/2021   Lab Results  Component Value Date   NA 141 09/26/2021   K 4.9 09/26/2021   CREATININE 1.91 (H) 09/26/2021   EGFR 33 (L) 09/21/2021   GFRNONAA 27 (L) 09/26/2021   GLUCOSE 149 (H) 09/26/2021   Lab Results  Component Value Date   WBC 8.4 09/21/2021   HGB 13.4 09/21/2021   HCT 41.7 09/21/2021   MCV 89 09/21/2021   PLT 291 09/21/2021   Lab Results  Component Value Date   LABMICR See below: 09/08/2021   LABMICR See below: 06/30/2021   MICROALBUR 150 (H) 08/19/2020   MICROALBUR 150 (H) 01/02/2019  -Not ideally controlled -GFRs stable in low 30s last jan dec nov - no change once off Jardiance -Current medications: Trulicity 4.5 mg once weekly Appropriate, Query  effective,  Tresiba 30 units daily Appropriate, Query effective,  Glipizide 5 mg once daily Appropriate, Query effective,  Jardiance 48m Appropriate, Query effective,  -Previous meds: metformin (GFR), Jardiance 25 mg daily (held dec-jan 2023, urology advised stopping d/t recurring UTI following pcp visit) -Current home glucose readings fasting glucose: typically 160-170, reports previous missed dose of tresiba leading to reading in upper 300s was very rare.  -Denies hypoglycemic/hyperglycemic symptoms. Only one episode that she can recall, has  -Current meal patterns: tries to be consistent with meals, avoids sweets. Husband often brining meals home.   -  Current exercise: limited due to back/knee pain - leg still hurt after surgery. Getting in pool for water walking would likely be better on knee but fall risk is a concern  -Educated on A1c and blood sugar goals; -Counseled to check feet daily and get yearly eye exams August 2023: Patient wasn't interested in getting up from the couch and getting her medications due to pain. Because of this, unable to talk about medications (She wasn't sure of what she was/wasn't taking). Recommend DC Glipizide and letting insulin take over  Hyperlipidemia: (LDL goal < 70) The 10-year ASCVD risk score (Arnett DK, et al., 2019) is: 36.9%   Values used to calculate the score:     Age: 65 years     Sex: Female     Is Non-Hispanic African American: No     Diabetic: Yes     Tobacco smoker: No     Systolic Blood Pressure: 767 mmHg     Is BP treated: Yes     HDL Cholesterol: 47 mg/dL     Total Cholesterol: 137 mg/dL Lab Results  Component Value Date   CHOL 137 09/08/2021   CHOL 161 02/15/2021   CHOL 152 08/19/2020   Lab Results  Component Value Date   HDL 47 09/08/2021   HDL 49 02/15/2021   HDL 53 08/19/2020   Lab Results  Component Value Date   LDLCALC 56 09/08/2021   LDLCALC 78 02/15/2021   LDLCALC 71 08/19/2020   Lab Results  Component Value  Date   TRIG 208 (H) 09/08/2021   TRIG 204 (H) 02/15/2021   TRIG 165 (H) 08/19/2020   Lab Results  Component Value Date   CHOLHDL 3.2 09/07/2015   CHOLHDL 3.4 12/17/2014   CHOLHDL 4.2 12/31/2013  No results found for: "LDLDIRECT" -Controlled -Current treatment: Rosuvastatin 50m Appropriate, Effective, Safe, Accessible -Medications previously tried: n/a  -Current dietary patterns: "Tries to eat healthy" -Current exercise habits: none -Educated on Cholesterol goals;  -Recommended to continue current medication   COPD (Goal: control symptoms and prevent exacerbations) -Controlled -Current treatment  Breztri Appropriate, Effective, Safe, Accessible Proair as needed Appropriate, Effective, Safe, Accessible [place]as Appropriate, Effective, Safe, Accessible -Gold Grade: Gold 2 (FEV1 50-79%) -Current COPD Classification:  A (low sx, <2 exacerbations/yr) -MMRC/CAT score: 9 (oct, 2022) -Pulmonary function testing: 2021 -Exacerbations requiring treatment in last 6 months: 0 -Patient denies consistent use of maintenance inhaler -Frequency of rescue inhaler use: 1-3x/wk -Counseled on Proper inhaler technique; When to use rescue inhaler -Recommended to continue current medication  Patient Goals/Self-Care Activities Patient will:  - take medications as prescribed  Medication Assistance:  -Patient stated she's content and doesn't require assistance paying for medications  [facility]  [facility] [address]Phone: [phone]: [phone] Pt endorses 100% compliance  Follow Up:  Patient agrees to Care Plan and Follow-up. Plan: Patient was laying down, didn't want to get up to schedule  NArizona Constable Pharm.[email or web address] [phone]     Future Appointments  Date Time Provider DMorse 10/25/2021 11:00 AM GMinna Merritts MD [facility]  10/26/2021 10:30 AM  CFP CCM CASE MANAGER [facility]FP-[facility]FP PEC  10/27/2021  6:00 PM [facility]  10/27/2021  7:00 PM [facility] 11/01/2021  9:15 AM Dew, JErskine Squibb MD [facility]-AV[facility] None  11/08/2021  2:00 PM JValerie Roys DO CFP-CFP PEC  12/20/2021  1:30  PM [facility]-CT [facility]-CT [facility]-Outpat[facility]  12/23/2021  2:30 PM CFP NURSE HEALTH ADVISOR CFP-CFP PEC  12/27/2021  1:15 PM CCAR-MO LAB [facility] None  12/27/2021  1:30 PM Cammie Sickle, MD [facility] None  12/29/2021  2:30 PM [name], MD [facility] None  01/18/2022  1:30 PM Hayden Pedro, MD TRE-TRE None   [place] Constable, Pharm.D. - [phone]

## 2021-10-25 ENCOUNTER — Ambulatory Visit (INDEPENDENT_AMBULATORY_CARE_PROVIDER_SITE_OTHER): Payer: Medicare Other | Admitting: Cardiovascular Disease

## 2021-10-25 ENCOUNTER — Encounter: Payer: Self-pay | Admitting: Cardiovascular Disease

## 2021-10-25 ENCOUNTER — Other Ambulatory Visit: Payer: Self-pay | Admitting: Orthopedic Surgery

## 2021-10-25 VITALS — BP 110/56 | HR 74 | Ht 66.0 in | Wt 216.8 lb

## 2021-10-25 DIAGNOSIS — I10 Essential (primary) hypertension: Secondary | ICD-10-CM

## 2021-10-25 DIAGNOSIS — I739 Peripheral vascular disease, unspecified: Secondary | ICD-10-CM

## 2021-10-25 DIAGNOSIS — I5032 Chronic diastolic (congestive) heart failure: Secondary | ICD-10-CM

## 2021-10-25 DIAGNOSIS — Z86711 Personal history of pulmonary embolism: Secondary | ICD-10-CM

## 2021-10-25 DIAGNOSIS — I200 Unstable angina: Secondary | ICD-10-CM

## 2021-10-25 DIAGNOSIS — N183 Chronic kidney disease, stage 3 unspecified: Secondary | ICD-10-CM | POA: Diagnosis not present

## 2021-10-25 DIAGNOSIS — E785 Hyperlipidemia, unspecified: Secondary | ICD-10-CM | POA: Diagnosis not present

## 2021-10-25 DIAGNOSIS — I25118 Atherosclerotic heart disease of native coronary artery with other forms of angina pectoris: Secondary | ICD-10-CM

## 2021-10-25 DIAGNOSIS — M5412 Radiculopathy, cervical region: Secondary | ICD-10-CM

## 2021-10-25 NOTE — Patient Instructions (Signed)
Medication Instructions:  No changes  If you need a refill on your cardiac medications before your next appointment, please call your pharmacy.   Lab work: No new labs needed  Testing/Procedures: No new testing needed  Follow-Up: At [facility], you and your health needs are our priority.  As part of our continuing mission to provide you with exceptional heart care, we have created designated Provider Care Teams.  These Care Teams include your primary Cardiologist (physician) and Advanced Practice Providers (APPs -  Physician Assistants and Nurse Practitioners) who all work together to provide you with the care you need, when you need it.  You will need a follow up appointment in 12 months  Providers on your designated Care Team:   Christopher Berge, NP Ryan Dunn, PA-C Cadence Furth, PA-C  COVID-19 Vaccine Information can be found at: [email or web address] For questions related to vaccine distribution or appointments, please email [email or web address] or call [phone].   

## 2021-10-25 NOTE — Progress Notes (Signed)
       Date:  10/25/2021   ID:  Tammy [name], DOB 01/26/1946, MRN [id number]  Patient Location:  [address] [place] [address]   Provider location:   [facility], [place] office  PCP:  Johnson, Megan P, DO  Cardiologist:  Gollan, [facility]  Chief Complaint  Patient presents with   Follow-up    Cath Follow up, no new Cardiac concerns    History of Present Illness:    Tammy [name] is a 76 y.o. female  past medical history of coronary artery disease, occluded mid RCA, in 2013  Nonobstructive LAD disease PAD with angioplasty of the legs bilaterally,  COPD,  chronic kidney disease diabetes type 2, diagnosis of PE in October 2015, started on anticoagulation, eliquis hypertension,  back surgery January 2014 with rods placed at that time,  remote smoking history stopped 5 years ago,  obesity,  deconditioned secondary to chronic back pain  catheterization January 2021 occluded RCA with collaterals nonobstructive LAD disease who presents for routine followup of her coronary artery disease   Last seen in clinic by myself September 20, 2021 At that time reported more episodes of chest pain and she was scheduled for cardiac catheterization  Cardiac catheterization performed September 26, 2021   Mid LAD lesion is 45% stenosed.   Prox RCA to Mid RCA lesion is 100% stenosed.   Prox RCA lesion is 90% stenosed.   1.  Severe one-vessel coronary artery disease with chronically occluded right coronary artery with well-developed left-to-right collaterals.  Stable moderate mid LAD disease. 2.  Left ventricular angiography was not performed due to chronic kidney disease.  EF was normal by echo. 3.  Right heart catheterization showed mildly elevated filling pressures, mild pulmonary hypertension and normal cardiac output. RA: 8 mmHg RV: 47/5 mmHg PA: 48/18 with a mean of 31 mmHg Cardiac output: 6.38 with an index of 3.08. Recommendations: No significant change in coronary  anatomy since most recent cardiac catheterization. Continue aggressive medical therapy. 26 mL of contrast was used for the procedure.  Cardiac catheterization results discussed in detail  Today reports not feeling well in general, etiology unclear Sedentary at baseline Concerned about PAD, has follow-up with Dr. Dew Recent lower extremity arterial Doppler showing moderate drop in perfusion to lower extremities  Minimally active at home, presents today in a wheelchair Reports that she has frequent UTIs, followed by urology  EKG personally reviewed by myself on todays visit Normal sinus rhythm rate 74 bpm no significant ST-T wave changes  In the hospital end of March 2023 for chest pain, UTI  Stress test performed April 2023, no ischemia low risk study  Lab work reviewed Total cholesterol 137 LDL 56 A1c 8.5 has been trending upwards Creatinine 1.66 BUN 29  Echo 09/03/19 for SOB Left ventricular ejection fraction, by estimation, is 55 to 60%. The  left ventricle has normal function. The left ventricle has no regional  wall motion abnormalities. Left ventricular diastolic parameters are  consistent with Grade I diastolic  dysfunction (impaired relaxation).   Cardiac cath 04/04/2019 Nonobstructive disease, stable mild to moderate mid LAD disease 45% Known occluded RCA with collaterals from left to right  Completed breast surgery with Dr. Burnett after diagnosis of breast cancer  Chronic back pain, stimulator in place ("does not use it, does not work") On Cymbalta  Several back surgeries most recently March 2018  back surgery October 2017  Previously in the emergency room, treated for bronchial asthma. Takes   her albuterol with some improvement of her symptoms. Shortness of breath symptoms have been dating back for many years.    may have developed a DVT from sitting long periods of time.     prior heart catheterizations in 2012 and 2013. Catheterization in 2012 documented  occluded mid RCA with left to right collaterals, 40% mid LAD disease, 40% mid circumflex disease, 30% proximal LAD disease Catheterization in September 2013 showed normal LV function, occluded mid RCA with left to right collaterals, 50% mid and distal LAD disease   Stress test 03/28/2018 Low risk  CT chest 02/2018   Past Medical History:  Diagnosis Date   Asthma    Benign neoplasm of colon    Breast cancer (HCC) 08/2018   left breast   Cervical disc disease    "bulging" - no limitations per pt   Chronic diastolic CHF (congestive heart failure) (HCC)    a. 07/2012 Echo: Nl EF. mild inferoseptal HK, Gr 2 DD, mild conc LVH, mild PR/TR, mild PAH; b. 08/2019 Echo: EF 55-60%, no rwma, Gr1 DD. Nl RV size/fxn.   CKD (chronic kidney disease), stage III (HCC)    COPD (chronic obstructive pulmonary disease) (HCC)    Coronary artery disease    a. 11/2011 Cath: LAD 50m/d, LCX min irregs, RCA 70p, 100m with L->R collats, EF 60%-->Med Rx; b. 03/2019 Cath: LM nl, LAD 40-50m. LCX large, mild diff dzs. OM1/2 nl. RCA known to be 100m. L-->R collats from LAD & LCX/OM.   Diabetes mellitus without complication (HCC)    Fusion of lumbar spine 12/22/2015   GERD (gastroesophageal reflux disease)    History of DVT (deep vein thrombosis)    History of tobacco abuse    a. Quit 2011.   Hyperlipidemia    Hypertension    Hypothyroidism    Lung cancer (HCC)    Obesity    Palpitations    Personal history of radiation therapy    PONV (postoperative nausea and vomiting)    Pulmonary embolus (HCC) 12/25/2013   RLL. Presented with SOB and elevated d-dimer.    PVD (peripheral vascular disease) (HCC)    a. PTA of right leg and left femoral artery with stenosis; b. 09/2020 ABI: R 0.93, L 0.88 - unchanged from prior study.   Stroke (HCC) 2015   TIAs - no deficits   Past Surgical History:  Procedure Laterality Date   ANGIOPLASTY / STENTING FEMORAL     PVD; angioplasty right leg and left femoral artery with stenosis.     APPENDECTOMY  12/2014   BACK SURGERY  03/2012   nerve stimulator inserted   BACK SURGERY     screws, rods replaced with new hardware.   BACK SURGERY  11/2016   BREAST LUMPECTOMY Left 08/26/2018   BREAST SURGERY     CARDIAC CATHETERIZATION  12/07/2011   Mid LAD 50%, distal LAD 50%, mid RCA 70%, distal RCA 100%    CARDIAC CATHETERIZATION  01/04/2011   100% occluded mid RCA with good collaterals from distal LAD, normal LVEF.   CHOLECYSTECTOMY     COLONOSCOPY  2013   COLONOSCOPY WITH PROPOFOL N/A 05/24/2015   Procedure: COLONOSCOPY WITH PROPOFOL;  Surgeon: Darren Wohl, MD;  Location: [facility];  Service: Endoscopy;  Laterality: N/A;  Diabetic - oral meds   COLONOSCOPY WITH PROPOFOL N/A 01/28/2019   Procedure: COLONOSCOPY WITH PROPOFOL;  Surgeon: Tahiliani, Varnita B, MD;  Location: [facility];  Service: Endoscopy;  Laterality: N/A;   DIAGNOSTIC LAPAROSCOPY       ESOPHAGOGASTRODUODENOSCOPY (EGD) WITH PROPOFOL N/A 01/28/2019   Procedure: ESOPHAGOGASTRODUODENOSCOPY (EGD) WITH PROPOFOL;  Surgeon: Tahiliani, Varnita B, MD;  Location: [facility];  Service: Endoscopy;  Laterality: N/A;   LAPAROSCOPIC APPENDECTOMY N/A 01/06/2015   Procedure: APPENDECTOMY LAPAROSCOPIC;  Surgeon: Seeplaputhur G Sankar, MD;  Location: [facility];  Service: General;  Laterality: N/A;   LEFT HEART CATH AND CORONARY ANGIOGRAPHY Left 04/04/2019   Procedure: LEFT HEART CATH AND CORONARY ANGIOGRAPHY;  Surgeon: Gollan, Timothy J, MD;  [facility]: [facility];  Service: Cardiovascular;  Laterality: Left;   MASTECTOMY W/ SENTINEL NODE BIOPSY Left 08/26/2018   Procedure: PARTIAL MASTECTOMY WIDE EXCISION WITH SENTINEL LYMPH NODE BIOPSY LEFT;  Surgeon: Byrnett, Jeffrey W, MD;  Location: [facility];  Service: General;  Laterality: Left;   POLYPECTOMY  05/24/2015   Procedure: POLYPECTOMY;  Surgeon: Darren Wohl, MD;  Location: [facility];  Service: Endoscopy;;   REPLACEMENT TOTAL KNEE Right     RIGHT/LEFT HEART CATH AND CORONARY ANGIOGRAPHY Bilateral 09/26/2021   Procedure: RIGHT/LEFT HEART CATH AND CORONARY ANGIOGRAPHY;  Surgeon: Arida, Muhammad A, MD;  Location: [facility];  Service: Cardiovascular;  Laterality: Bilateral;   THYROIDECTOMY     TOTAL KNEE ARTHROPLASTY Right 11/13/2019   TRIGGER FINGER RELEASE       Current Meds  Medication Sig   acyclovir (ZOVIRAX) 800 MG tablet Take 800 mg by mouth 4 (four) times daily.   albuterol (PROVENTIL) (2.5 MG/3ML) 0.083% nebulizer solution Take 3 mLs (2.5 mg total) by nebulization every 6 (six) hours as needed for wheezing or shortness of breath.   albuterol (VENTOLIN HFA) 108 (90 Base) MCG/ACT inhaler Inhale 2 puffs into the lungs every 6 (six) hours as needed for wheezing or shortness of breath.   anastrozole (ARIMIDEX) 1 MG tablet TAKE 1 TABLET BY MOUTH DAILY   apixaban (ELIQUIS) 2.5 MG TABS tablet Take 1 tablet (2.5 mg total) by mouth 2 (two) times daily.   bisoprolol (ZEBETA) 10 MG tablet Take 1 tablet (10 mg total) by mouth 2 (two) times daily.   blood glucose meter kit and supplies KIT Dispense based on patient and insurance preference. Use one time daily as directed, E11.9   Budeson-Glycopyrrol-Formoterol (BREZTRI AEROSPHERE) 160-9-4.8 MCG/ACT AERO Inhale 2 puffs into the lungs in the morning and at bedtime.   Calcium Carb-Cholecalciferol (CALCIUM 600 + D) 600-200 MG-UNIT TABS Take 1 tablet by mouth daily.    cholecalciferol (VITAMIN D3) 25 MCG (1000 UT) tablet Take 1,000 Units by mouth daily.   CRANBERRY PO Take 2 capsules by mouth daily.   cyclobenzaprine (FLEXERIL) 5 MG tablet Take 1 tablet (5 mg total) by mouth 3 (three) times daily as needed for muscle spasms.   DULoxetine (CYMBALTA) 60 MG capsule TAKE 2 CAPSULES BY MOUTH EVERY DAY   fluticasone (FLONASE) 50 MCG/ACT nasal spray Place 1 spray into both nostrils daily.   gabapentin (NEURONTIN) 400 MG capsule Take 1 capsule (400 mg total) by mouth 3 (three) times daily.    glipiZIDE (GLUCOTROL) 5 MG tablet TAKE 1 TABLET BY MOUTH DAILY BEFORE BREAKFAST   insulin degludec (TRESIBA FLEXTOUCH) 100 UNIT/ML FlexTouch Pen Inject 38-40 Units into the skin daily.   Insulin Pen Needle (PEN NEEDLES 3/16") 31G X 5 MM MISC 1 each by Does not apply route daily.   isosorbide mononitrate (IMDUR) 60 MG 24 hr tablet TAKE ONE TABLET BY MOUTH AT BEDTIME   JARDIANCE 25 MG TABS tablet Take 25 mg by mouth every morning.   Lactobacillus (PROBIOTIC ACIDOPHILUS   PO) Take 1 capsule by mouth daily at 12 noon.   lidocaine (LIDODERM) 5 % Place 1 patch onto the skin daily. Remove & Discard patch within 12 hours or as directed by MD   montelukast (SINGULAIR) 10 MG tablet TAKE 1 TABLET BY MOUTH AT BEDTIME   MYRBETRIQ 50 MG TB24 tablet TAKE 1 TABLET BY MOUTH DAILY   nitrofurantoin, macrocrystal-monohydrate, (MACROBID) 100 MG capsule Take 100 mg by mouth daily.   nitroGLYCERIN (NITROSTAT) 0.4 MG SL tablet Place 1 tablet (0.4 mg total) under the tongue every 5 (five) minutes as needed for chest pain. Total of 3 doses   oxyCODONE-acetaminophen (PERCOCET/ROXICET) 5-325 MG tablet Take 0.5 tablets by mouth every 8 (eight) hours as needed for severe pain.   pantoprazole (PROTONIX) 40 MG tablet Take 1 tablet (40 mg total) by mouth daily.   Pumpkin Seed-Soy Germ (AZO BLADDER CONTROL/GO-LESS PO) Take 1 tablet by mouth in the morning and at bedtime.   rosuvastatin (CRESTOR) 40 MG tablet TAKE 1 TABLET BY MOUTH DAILY   senna (SENOKOT) 8.6 MG TABS tablet Take 8.6 mg by mouth daily as needed for mild constipation.   SYNTHROID 150 MCG tablet TAKE ONE TABLET ON AN EMPTY STOMACH WITHA GLASS OF WATER AT LEAST 30 TO 60 MINUTES BEFORE BREAKFAST   trimethoprim (TRIMPEX) 100 MG tablet TAKE ONE TABLET BY MOUTH EVERY DAY   TRULICITY 4.5 MG/0.5ML SOPN SMARTSIG:4.5 Milligram(s) SUB-Q Once a Week   UNABLE TO FIND Med Name: Energy, memory and mood enhancer   ZINC CITRATE PO Take 1 tablet by mouth daily.     Allergies:    Hydrocodone, Aleve [naproxen sodium], Contrast media [iodinated contrast media], Ioxaglate, Oxycodone, Ranexa [ranolazine], Sulfa antibiotics, and Tramadol   Social History   Tobacco Use   Smoking status: Former    Packs/day: 0.50    Years: 15.00    Total pack years: 7.50    Types: Cigarettes    Quit date: 03/14/2007    Years since quitting: 14.6   Smokeless tobacco: Never  Vaping Use   Vaping Use: Never used  Substance Use Topics   Alcohol use: No    Alcohol/week: 0.0 standard drinks of alcohol   Drug use: Never     Family Hx: The patient's family history includes COPD in her sister and sister; Cancer in her mother and sister; Colon cancer (age of onset: 75) in her brother; Diabetes in her brother, brother, brother, and father; Heart attack in her brother, brother, brother, and father; Heart disease in her brother, brother, brother, and father; Hypertension in her brother, brother, brother, and father. There is no history of Breast cancer.  ROS:   Please see the history of present illness.    Review of Systems  Constitutional: Negative.   HENT: Negative.    Respiratory: Negative.    Cardiovascular: Negative.   Gastrointestinal: Negative.   Musculoskeletal: Negative.   Neurological: Negative.   Psychiatric/Behavioral: Negative.    All other systems reviewed and are negative.    Labs/Other Tests and Data Reviewed:    Recent Labs: 03/23/2021: TSH 0.510 09/08/2021: ALT 17 09/21/2021: Hemoglobin 13.4; Platelets 291 09/26/2021: BUN 29; Creatinine, Ser 1.91; Potassium 4.9; Sodium 141   Recent Lipid Panel Lab Results  Component Value Date/Time   CHOL 137 09/08/2021 09:24 AM   CHOL 152 01/31/2016 02:19 PM   TRIG 208 (H) 09/08/2021 09:24 AM   TRIG 224 (H) 01/31/2016 02:19 PM   HDL 47 09/08/2021 09:24 AM   CHOLHDL 3.2 09/07/2015 08:05   AM   CHOLHDL 3.4 12/17/2014 10:17 AM   LDLCALC 56 09/08/2021 09:24 AM    Wt Readings from Last 3 Encounters:  10/25/21 216 lb 12.8 oz (98.3  kg)  10/18/21 217 lb 1.6 oz (98.5 kg)  10/13/21 215 lb 14.4 oz (97.9 kg)     Exam:    Vital Signs: Vital signs may also be detailed in the HPI BP (!) 110/56 (BP Location: Right Arm, Patient Position: Sitting, Cuff Size: Large)   Pulse 74   Ht 5' 6" (1.676 m)   Wt 216 lb 12.8 oz (98.3 kg)   SpO2 93%   BMI 34.99 kg/m   Presenting in a wheelchair Constitutional:  oriented to person, place, and time. No distress.  HENT:  Head: Grossly normal Eyes:  no discharge. No scleral icterus.  Neck: No JVD, no carotid bruits  Cardiovascular: Regular rate and rhythm, no murmurs appreciated Pulmonary/Chest: Clear to auscultation bilaterally, no wheezes or rails Abdominal: Soft.  no distension.  no tenderness.  Musculoskeletal: Normal range of motion Neurological:  normal muscle tone. Coordination normal. No atrophy Skin: Skin warm and dry Psychiatric: normal affect, pleasant   ASSESSMENT & PLAN:    Coronary artery disease with stable angina Chest pain with hospitalization March 2023 Stress test April 2023 no significant ischemia Had recurrent chest pain on prior office visit, cardiac catheterization performed with stable disease occluded RCA with collaterals from left to right, medical management recommended -Reports that she has nitro sublingual for recurrent episodes of chest pain Cardiac rehab offered, she would wait until she has seen Dr. Lucky Cowboy as she feels PAD/medication type symptoms  Abdominal aortic atherosclerosis  cholesterol at goal Continue aggressive management Stressed the importance of aggressive diabetes control  PAD (peripheral artery disease) (HCC) Aggressive lipid management recommended Moderate drop in ABIs bilaterally, has follow-up with Dr. Lucky Cowboy  Morbid obesity due to excess calories (Susan Moore) With diagnosis of type 2 diabetes, peripheral arterial disease, essential hypertension, hyperlipidemia, aortic atherosclerosis Sedentary at baseline, we have offered cardiac  rehab  Essential hypertension Blood pressure is well controlled on today's visit. No changes made to the medications.  Uncontrolled type 2 diabetes mellitus with complication, without long-term current use of insulin (HCC) A1c trending up over the past 2 years Reports having some low sugars recently, "Doing better"  Mixed hyperlipidemia Cholesterol is at goal on the current lipid regimen. No changes to the medications were made.  History of DVT and PE Continue Eliquis 2.5 mg twice daily prophylactic dosing We will hold Eliquis for cardiac catheterization   Total encounter time more than 30 minutes  Greater than 50% was spent in counseling and coordination of care with the patient    Signed, Ida Rogue, MD  10/25/2021 11:15 AM    [facility] Office [address], [place], [place] [address]

## 2021-10-26 ENCOUNTER — Telehealth: Payer: Medicare Other

## 2021-10-26 ENCOUNTER — Ambulatory Visit: Payer: Self-pay

## 2021-10-26 NOTE — Patient Outreach (Signed)
  Care Coordination   10/26/2021 Name: Tammy [name] MRN: [id number] DOB: 05-28-1945   Care Coordination Outreach Attempts:  An unsuccessful telephone outreach was attempted today to offer the patient information about available care coordination services as a benefit of their health plan.   Follow Up Plan:  Additional outreach attempts will be made to offer the patient care coordination information and services.   Encounter Outcome:  No Answer  Care Coordination Interventions Activated:  No   Care Coordination Interventions:  No, not indicated    Noreene Larsson RN, MSN, CCM Community Care Coordinator [facility] Mobile: [phone]

## 2021-10-27 ENCOUNTER — Ambulatory Visit: Payer: Medicare Other

## 2021-10-27 ENCOUNTER — Ambulatory Visit: Admission: RE | Admit: 2021-10-27 | Payer: Medicare Other | Source: Ambulatory Visit

## 2021-10-28 ENCOUNTER — Other Ambulatory Visit: Payer: Self-pay | Admitting: Family Medicine

## 2021-10-28 NOTE — Telephone Encounter (Signed)
Requested medication (s) are due for refill today: for review  Requested medication (s) are on the active medication list: yes  Last refill:  09/08/21 #30/1  Future visit scheduled: yes  Notes to clinic:  pharmacy is needing new rx for 40 units dosage. Please advise     Requested Prescriptions  Pending Prescriptions Disp Refills   TRESIBA FLEXTOUCH 100 UNIT/ML FlexTouch Pen [Pharmacy Med Name: TRESIBA FLEXTOUCH 100 UNIT/ML SUBQ] 30 mL     Sig: INJECT 34 UNITS INTO THE SKIN DAILY.     Endocrinology:  Diabetes - Insulins Failed - 10/28/2021  9:39 AM      Failed - HBA1C is between 0 and 7.9 and within 180 days    Hemoglobin A1C  Date Value Ref Range Status  01/31/2016 8.7  Final   HB A1C (BAYER DCA - WAIVED)  Date Value Ref Range Status  09/08/2021 8.5 (H) 4.8 - 5.6 % Final    Comment:             Prediabetes: 5.7 - 6.4          Diabetes: >6.4          Glycemic control for adults with diabetes: <7.0          Passed - Valid encounter within last 6 months    Recent Outpatient Visits           1 week ago Intermittent left-sided chest pain   [place], Megan P, DO   2 weeks ago Intermittent left-sided chest pain   [facility] Jon Billings, NP   1 month ago Left-sided chest pain   [place], Megan P, DO   2 months ago Intermittent left-sided chest pain   [place] Vigg, Avant[place], MD   3 months ago Breast pain, left   [facility] [place], [place], DO       Future Appointments             In 1 week [place], Megan P, DO [place], [facility]   In 1 month  [facility], [facility]            Signed Prescriptions Disp Refills   isosorbide mononitrate (IMDUR) 60 MG 24 hr tablet 90 tablet 1    Sig: TAKE ONE TABLET BY MOUTH AT BEDTIME     Cardiovascular:  Nitrates Passed - 10/28/2021  9:39 AM      Passed - Last BP in normal range    BP Readings from Last 1  Encounters:  10/25/21 (!) 110/56         Passed - Last Heart Rate in normal range    Pulse Readings from Last 1 Encounters:  10/25/21 74         Passed - Valid encounter within last 12 months    Recent Outpatient Visits           1 week ago Intermittent left-sided chest pain   [place], Megan P, DO   2 weeks ago Intermittent left-sided chest pain   [place], NP   1 month ago Left-sided chest pain   [place], Megan P, DO   2 months ago Intermittent left-sided chest pain   [place], MD   3 months ago Breast pain, left   [place], [place], DO       Future Appointments  In 1 week Johnson, Megan P, DO [facility], [facility]   In 1 month  [facility], [facility]             ELIQUIS 2.5 MG TABS tablet 180 tablet 1    Sig: TAKE ONE TABLET BY MOUTH TWICE DAILY     Hematology:  Anticoagulants - apixaban Failed - 10/28/2021  9:39 AM      Failed - Cr in normal range and within 360 days    Creatinine  Date Value Ref Range Status  09/20/2011 1.31 (H) 0.60 - 1.30 mg/dL Final   Creatinine, Ser  Date Value Ref Range Status  09/26/2021 1.91 (H) 0.44 - 1.00 mg/dL Final         Passed - PLT in normal range and within 360 days    Platelets  Date Value Ref Range Status  09/21/2021 291 150 - 450 x10E3/uL Final         Passed - HGB in normal range and within 360 days    Hemoglobin  Date Value Ref Range Status  09/21/2021 13.4 11.1 - 15.9 g/dL Final         Passed - HCT in normal range and within 360 days    Hematocrit  Date Value Ref Range Status  09/21/2021 41.7 34.0 - 46.6 % Final         Passed - AST in normal range and within 360 days    AST  Date Value Ref Range Status  09/08/2021 20 0 - 40 IU/L Final         Passed - ALT in normal range and within 360 days    ALT  Date Value Ref Range Status   09/08/2021 17 0 - 32 IU/L Final         Passed - Valid encounter within last 12 months    Recent Outpatient Visits           1 week ago Intermittent left-sided chest pain   [place], Megan P, DO   2 weeks ago Intermittent left-sided chest pain   [facility] Jon Billings, NP   1 month ago Left-sided chest pain   [facility] [place], Megan P, DO   2 months ago Intermittent left-sided chest pain   [facility] Vigg, Avanti, MD   3 months ago Breast pain, left   [facility] [place], [place], DO       Future Appointments             In 1 week Wynetta Emery, Barb Merino, DO [facility], [place]   In 1 month  [facility], [facility]

## 2021-10-28 NOTE — Telephone Encounter (Signed)
Requested Prescriptions  Pending Prescriptions Disp Refills  . TRESIBA FLEXTOUCH 100 UNIT/ML FlexTouch Pen [Pharmacy Med Name: TRESIBA FLEXTOUCH 100 UNIT/ML SUBQ] 30 mL     Sig: INJECT 34 UNITS INTO THE SKIN DAILY.     Endocrinology:  Diabetes - Insulins Failed - 10/28/2021  9:39 AM      Failed - HBA1C is between 0 and 7.9 and within 180 days    Hemoglobin A1C  Date Value Ref Range Status  01/31/2016 8.7  Final   HB A1C (BAYER DCA - WAIVED)  Date Value Ref Range Status  09/08/2021 8.5 (H) 4.8 - 5.6 % Final    Comment:             Prediabetes: 5.7 - 6.4          Diabetes: >6.4          Glycemic control for adults with diabetes: <7.0          Passed - Valid encounter within last 6 months    Recent Outpatient Visits          1 week ago Intermittent left-sided chest pain   [place], Megan P, DO   2 weeks ago Intermittent left-sided chest pain   [facility] Jon Billings, NP   1 month ago Left-sided chest pain   [place], Megan P, DO   2 months ago Intermittent left-sided chest pain   [place] Vigg, Avanti, MD   3 months ago Breast pain, left   [facility] [place], [place], DO      Future Appointments            In 1 week Johnson, Megan P, DO [place], [facility]   In 1 month  [facility], [facility]           . isosorbide mononitrate (IMDUR) 60 MG 24 hr tablet [Pharmacy Med Name: ISOSORBIDE MONONITRATE ER 60 MG TAB] 90 tablet 0    Sig: TAKE ONE TABLET BY MOUTH AT BEDTIME     Cardiovascular:  Nitrates Passed - 10/28/2021  9:39 AM      Passed - Last BP in normal range    BP Readings from Last 1 Encounters:  10/25/21 (!) 110/56         Passed - Last Heart Rate in normal range    Pulse Readings from Last 1 Encounters:  10/25/21 74         Passed - Valid encounter within last 12 months    Recent Outpatient Visits          1 week ago Intermittent left-sided  chest pain   [place], Megan P, DO   2 weeks ago Intermittent left-sided chest pain   [place], NP   1 month ago Left-sided chest pain   [place], Megan P, DO   2 months ago Intermittent left-sided chest pain   [place] Vigg, Avanti, MD   3 months ago Breast pain, left   [place], [place], DO      Future Appointments            In 1 week Wynetta Emery, Barb Merino, DO [place], [facility]   In 1 month  [facility], [facility]           . ELIQUIS 2.5 MG TABS tablet [Pharmacy Med Name: ELIQUIS 2.5 MG TAB] 180 tablet 1  Sig: TAKE ONE TABLET BY MOUTH TWICE DAILY     Hematology:  Anticoagulants - apixaban Failed - 10/28/2021  9:39 AM      Failed - Cr in normal range and within 360 days    Creatinine  Date Value Ref Range Status  09/20/2011 1.31 (H) 0.60 - 1.30 mg/dL Final   Creatinine, Ser  Date Value Ref Range Status  09/26/2021 1.91 (H) 0.44 - 1.00 mg/dL Final         Passed - PLT in normal range and within 360 days    Platelets  Date Value Ref Range Status  09/21/2021 291 150 - 450 x10E3/uL Final         Passed - HGB in normal range and within 360 days    Hemoglobin  Date Value Ref Range Status  09/21/2021 13.4 11.1 - 15.9 g/dL Final         Passed - HCT in normal range and within 360 days    Hematocrit  Date Value Ref Range Status  09/21/2021 41.7 34.0 - 46.6 % Final         Passed - AST in normal range and within 360 days    AST  Date Value Ref Range Status  09/08/2021 20 0 - 40 IU/L Final         Passed - ALT in normal range and within 360 days    ALT  Date Value Ref Range Status  09/08/2021 17 0 - 32 IU/L Final         Passed - Valid encounter within last 12 months    Recent Outpatient Visits          1 week ago Intermittent left-sided chest pain   [place], Megan P, DO   2 weeks ago  Intermittent left-sided chest pain   [facility] Jon Billings, NP   1 month ago Left-sided chest pain   [facility] [place], Megan P, DO   2 months ago Intermittent left-sided chest pain   [facility] Vigg, [place], MD   3 months ago Breast pain, left   [facility] [place], [place], DO      Future Appointments            In 1 week Wynetta Emery, Barb Merino, DO [place], [place]   In 1 month  [facility], [facility]

## 2021-11-01 ENCOUNTER — Encounter (INDEPENDENT_AMBULATORY_CARE_PROVIDER_SITE_OTHER): Payer: Self-pay | Admitting: Vascular Surgery

## 2021-11-01 ENCOUNTER — Ambulatory Visit (INDEPENDENT_AMBULATORY_CARE_PROVIDER_SITE_OTHER): Payer: Medicare Other | Admitting: Vascular Surgery

## 2021-11-01 VITALS — BP 109/69 | HR 78 | Resp 16

## 2021-11-01 DIAGNOSIS — Z794 Long term (current) use of insulin: Secondary | ICD-10-CM | POA: Diagnosis not present

## 2021-11-01 DIAGNOSIS — I70211 Atherosclerosis of native arteries of extremities with intermittent claudication, right leg: Secondary | ICD-10-CM | POA: Diagnosis not present

## 2021-11-01 DIAGNOSIS — I10 Essential (primary) hypertension: Secondary | ICD-10-CM | POA: Diagnosis not present

## 2021-11-01 DIAGNOSIS — E782 Mixed hyperlipidemia: Secondary | ICD-10-CM

## 2021-11-01 DIAGNOSIS — E1122 Type 2 diabetes mellitus with diabetic chronic kidney disease: Secondary | ICD-10-CM | POA: Diagnosis not present

## 2021-11-01 DIAGNOSIS — N1832 Chronic kidney disease, stage 3b: Secondary | ICD-10-CM | POA: Diagnosis not present

## 2021-11-01 DIAGNOSIS — I200 Unstable angina: Secondary | ICD-10-CM | POA: Diagnosis not present

## 2021-11-01 DIAGNOSIS — N183 Chronic kidney disease, stage 3 unspecified: Secondary | ICD-10-CM | POA: Diagnosis not present

## 2021-11-01 DIAGNOSIS — I739 Peripheral vascular disease, unspecified: Secondary | ICD-10-CM

## 2021-11-01 NOTE — Assessment & Plan Note (Signed)
blood pressure control important in reducing the progression of atherosclerotic disease. On appropriate oral medications.  

## 2021-11-01 NOTE — Assessment & Plan Note (Signed)
Hydrate and limit contrast if any procedures are performed.

## 2021-11-01 NOTE — Assessment & Plan Note (Signed)
ABIs have dropped to 0.76 on the right and 0.75 on the left from her studies a year ago.  She is having more right leg claudication symptoms currently.  We discussed the pathophysiology and natural history of peripheral arterial disease today.  This is the leg we intervened upon about a decade ago.  She is having more issues from shingles and other things at this point does not want to undergo any procedure, but we have discussed a shortened interval follow-up and I will plan to see her back in about 3 months with noninvasive studies.

## 2021-11-01 NOTE — Progress Notes (Signed)
MRN : [id number]  Tammy [name] is a 76 y.o. (Aug 01, 1945) female who presents with chief complaint of  Chief Complaint  Patient presents with   Follow-up    Ultrasound results  .  History of Present Illness: Patient returns today in follow up of her peripheral arterial disease.  Since her last visit, she has had deterioration in her symptoms and is having more claudication symptoms in the right leg.  This is coming on a short distances.  She is having cramping in her calf and she has to stop and rest.  This is the leg we intervened on over a decade ago.  This is also the leg she had knee replacement on last year.  No open wounds or infection.  No fevers or chills.  She is being bothered currently by what sounds like a bad case of shingles in her left side and flank area.  ABIs were recently checked and were found to have dropped significantly down to 0.76 on the right and 0.75 on the left.  Current Outpatient Medications  Medication Sig Dispense Refill   acyclovir (ZOVIRAX) 800 MG tablet Take 800 mg by mouth 4 (four) times daily.     albuterol (PROVENTIL) (2.5 MG/3ML) 0.083% nebulizer solution Take 3 mLs (2.5 mg total) by nebulization every 6 (six) hours as needed for wheezing or shortness of breath. 225 mL 12   albuterol (VENTOLIN HFA) 108 (90 Base) MCG/ACT inhaler Inhale 2 puffs into the lungs every 6 (six) hours as needed for wheezing or shortness of breath. 8 g 6   anastrozole (ARIMIDEX) 1 MG tablet TAKE 1 TABLET BY MOUTH DAILY 90 tablet 1   bisoprolol (ZEBETA) 10 MG tablet Take 1 tablet (10 mg total) by mouth 2 (two) times daily. 60 tablet 2   blood glucose meter kit and supplies KIT Dispense based on patient and insurance preference. Use one time daily as directed, E11.9 1 each 0   Budeson-Glycopyrrol-Formoterol (BREZTRI AEROSPHERE) 160-9-4.8 MCG/ACT AERO Inhale 2 puffs into the lungs in the morning and at bedtime. 10.7 g 5   Calcium Carb-Cholecalciferol (CALCIUM 600 + D) 600-200 MG-UNIT  TABS Take 1 tablet by mouth daily.      cholecalciferol (VITAMIN D3) 25 MCG (1000 UT) tablet Take 1,000 Units by mouth daily.     CRANBERRY PO Take 2 capsules by mouth daily.     cyclobenzaprine (FLEXERIL) 5 MG tablet Take 1 tablet (5 mg total) by mouth 3 (three) times daily as needed for muscle spasms. 30 tablet 0   DULoxetine (CYMBALTA) 60 MG capsule TAKE 2 CAPSULES BY MOUTH EVERY DAY 180 capsule 1   ELIQUIS 2.5 MG TABS tablet TAKE ONE TABLET BY MOUTH TWICE DAILY 180 tablet 1   fluticasone (FLONASE) 50 MCG/ACT nasal spray Place 1 spray into both nostrils daily. 16 g 2   gabapentin (NEURONTIN) 400 MG capsule Take 1 capsule (400 mg total) by mouth 3 (three) times daily. 270 capsule 1   glipiZIDE (GLUCOTROL) 5 MG tablet TAKE 1 TABLET BY MOUTH DAILY BEFORE BREAKFAST 90 tablet 1   insulin degludec (TRESIBA FLEXTOUCH) 100 UNIT/ML FlexTouch Pen Inject 38-40 Units into the skin daily. 30.6 mL 1   Insulin Pen Needle (PEN NEEDLES 3/16") 31G X 5 MM MISC 1 each by Does not apply route daily. 100 each 12   isosorbide mononitrate (IMDUR) 60 MG 24 hr tablet TAKE ONE TABLET BY MOUTH AT BEDTIME 90 tablet 1   JARDIANCE 25 MG TABS tablet Take 25  mg by mouth every morning.     Lactobacillus (PROBIOTIC ACIDOPHILUS PO) Take 1 capsule by mouth daily at 12 noon.     lidocaine (LIDODERM) 5 % Place 1 patch onto the skin daily. Remove & Discard patch within 12 hours or as directed by MD 30 patch 0   montelukast (SINGULAIR) 10 MG tablet TAKE 1 TABLET BY MOUTH AT BEDTIME 90 tablet 0   MYRBETRIQ 50 MG TB24 tablet TAKE 1 TABLET BY MOUTH DAILY 30 tablet 2   nitrofurantoin, macrocrystal-monohydrate, (MACROBID) 100 MG capsule Take 100 mg by mouth daily.     nitroGLYCERIN (NITROSTAT) 0.4 MG SL tablet Place 1 tablet (0.4 mg total) under the tongue every 5 (five) minutes as needed for chest pain. Total of 3 doses 25 tablet 6   oxyCODONE-acetaminophen (PERCOCET/ROXICET) 5-325 MG tablet Take 0.5 tablets by mouth every 8 (eight) hours  as needed for severe pain. 45 tablet 0   pantoprazole (PROTONIX) 40 MG tablet Take 1 tablet (40 mg total) by mouth daily. 90 tablet 1   Pumpkin Seed-Soy Germ (AZO BLADDER CONTROL/GO-LESS PO) Take 1 tablet by mouth in the morning and at bedtime.     rosuvastatin (CRESTOR) 40 MG tablet TAKE 1 TABLET BY MOUTH DAILY 90 tablet 1   senna (SENOKOT) 8.6 MG TABS tablet Take 8.6 mg by mouth daily as needed for mild constipation.     SYNTHROID 150 MCG tablet TAKE ONE TABLET ON AN EMPTY STOMACH WITHA GLASS OF WATER AT LEAST 30 TO 60 MINUTES BEFORE BREAKFAST 90 tablet 3   trimethoprim (TRIMPEX) 100 MG tablet TAKE ONE TABLET BY MOUTH EVERY DAY 90 tablet 1   TRULICITY 4.5 HU/7.6LY SOPN SMARTSIG:4.5 Milligram(s) SUB-Q Once a Week     UNABLE TO FIND Med Name: Energy, memory and mood enhancer     ZINC CITRATE PO Take 1 tablet by mouth daily.     ciprofloxacin (CIPRO) 500 MG tablet Take 1 tablet (500 mg total) by mouth 2 (two) times daily. (Patient not taking: Reported on 10/25/2021) 14 tablet 0   No current facility-administered medications for this visit.    Past Medical History:  Diagnosis Date   Asthma    Benign neoplasm of colon    Breast cancer ([place]) 08/2018   left breast   Cervical disc disease    "bulging" - no limitations per pt   Chronic diastolic CHF (congestive heart failure) ([place])    a. 07/2012 Echo: Nl EF. mild inferoseptal HK, Gr 2 DD, mild conc LVH, mild PR/TR, mild PAH; b. 08/2019 Echo: EF 55-60%, no rwma, Gr1 DD. Nl RV size/fxn.   CKD (chronic kidney disease), stage III (HCC)    COPD (chronic obstructive pulmonary disease) (HCC)    Coronary artery disease    a. 11/2011 Cath: LAD 43md, LCX min irregs, RCA 70p, 1067mith L->R collats, EF 60%-->Med Rx; b. 03/2019 Cath: LM nl, LAD 40-5054mCX large, mild diff dzs. OM1/2 nl. RCA known to be 100m43m->R collats from LAD & LCX/OM.   Diabetes mellitus without complication (HCC)    Fusion of lumbar spine 12/22/2015   GERD (gastroesophageal reflux  disease)    History of DVT (deep vein thrombosis)    History of tobacco abuse    a. Quit 2011.   Hyperlipidemia    Hypertension    Hypothyroidism    Lung cancer (HCC)[place] Obesity    Palpitations    Personal history of radiation therapy    PONV (postoperative nausea and vomiting)  Pulmonary embolus ([place]) 12/25/2013   RLL. Presented with SOB and elevated d-dimer.    PVD (peripheral vascular disease) ([place])    a. PTA of right leg and left femoral artery with stenosis; b. 09/2020 ABI: R 0.93, L 0.88 - unchanged from prior study.   Stroke [facility]) 2015   TIAs - no deficits    Past Surgical History:  Procedure Laterality Date   ANGIOPLASTY / STENTING FEMORAL     PVD; angioplasty right leg and left femoral artery with stenosis.    APPENDECTOMY  12/2014   BACK SURGERY  03/2012   nerve stimulator inserted   BACK SURGERY     screws, rods replaced with new hardware.   BACK SURGERY  11/2016   BREAST LUMPECTOMY Left 08/26/2018   BREAST SURGERY     CARDIAC CATHETERIZATION  12/07/2011   Mid LAD 50%, distal LAD 50%, mid RCA 70%, distal RCA 100%    CARDIAC CATHETERIZATION  01/04/2011   100% occluded mid RCA with good collaterals from distal LAD, normal LVEF.   CHOLECYSTECTOMY     COLONOSCOPY  2013   COLONOSCOPY WITH PROPOFOL N/A 05/24/2015   Procedure: COLONOSCOPY WITH PROPOFOL;  Surgeon: [name], MD;  Location: [place];  Service: Endoscopy;  Laterality: N/A;  Diabetic - oral meds   COLONOSCOPY WITH PROPOFOL N/A 01/28/2019   Procedure: COLONOSCOPY WITH PROPOFOL;  Surgeon: [name], MD;  Location: [facility];  Service: Endoscopy;  Laterality: N/A;   DIAGNOSTIC LAPAROSCOPY     ESOPHAGOGASTRODUODENOSCOPY (EGD) WITH PROPOFOL N/A 01/28/2019   Procedure: ESOPHAGOGASTRODUODENOSCOPY (EGD) WITH PROPOFOL;  Surgeon: [name], MD;  Location: [facility];  Service: Endoscopy;  Laterality: N/A;   LAPAROSCOPIC APPENDECTOMY N/A 01/06/2015   Procedure:  APPENDECTOMY LAPAROSCOPIC;  Surgeon: [name], MD;  Location: [facility];  Service: General;  Laterality: N/A;   LEFT HEART CATH AND CORONARY ANGIOGRAPHY Left 04/04/2019   Procedure: LEFT HEART CATH AND CORONARY ANGIOGRAPHY;  Surgeon: [name], MD;  Location: [place] [facility];  Service: Cardiovascular;  Laterality: Left;   MASTECTOMY W/ SENTINEL NODE BIOPSY Left 08/26/2018   Procedure: PARTIAL MASTECTOMY WIDE EXCISION WITH SENTINEL LYMPH NODE BIOPSY LEFT;  Surgeon: [name], MD;  Location: [facility];  Service: General;  Laterality: Left;   POLYPECTOMY  05/24/2015   Procedure: POLYPECTOMY;  Surgeon: [name], MD;  Location: [place];  Service: Endoscopy;;   REPLACEMENT TOTAL KNEE Right    RIGHT/LEFT HEART CATH AND CORONARY ANGIOGRAPHY Bilateral 09/26/2021   Procedure: RIGHT/LEFT HEART CATH AND CORONARY ANGIOGRAPHY;  Surgeon: Wellington Hampshire, MD;  Location: [place] [facility];  Service: Cardiovascular;  Laterality: Bilateral;   THYROIDECTOMY     TOTAL KNEE ARTHROPLASTY Right 11/13/2019   TRIGGER FINGER RELEASE       Social History   Tobacco Use   Smoking status: Former    Packs/day: 0.50    Years: 15.00    Total pack years: 7.50    Types: Cigarettes    Quit date: 03/14/2007    Years since quitting: 14.6   Smokeless tobacco: Never  Vaping Use   Vaping Use: Never used  Substance Use Topics   Alcohol use: No    Alcohol/week: 0.0 standard drinks of alcohol   Drug use: Never      Family History  Problem Relation Age of Onset   Cancer Mother        colon   Hypertension Father    Heart disease Father  Heart attack Father    Diabetes Father    Cancer Sister        lung   COPD Sister    COPD Sister    Heart attack Brother    Diabetes Brother    Hypertension Brother    Heart disease Brother    Heart attack Brother    Diabetes Brother    Hypertension Brother    Heart disease Brother    Heart attack Brother    Diabetes  Brother    Hypertension Brother    Heart disease Brother    Colon cancer Brother 69   Breast cancer Neg Hx      Allergies  Allergen Reactions   Hydrocodone Other (See Comments)    Unresponsive   Aleve [Naproxen Sodium]     Hives & itching    Contrast Media [Iodinated Contrast Media]     Pt avoids due to kidney issues   Ioxaglate Other (See Comments)    (iodine) Pt avoids due to kidney issues    Oxycodone Other (See Comments)    hallucinations   Ranexa [Ranolazine]     Sick on stomach   Sulfa Antibiotics Other (See Comments)    Pt avoids due to kidney issues   Tramadol Other (See Comments)    nightmares Other reaction(s): Hallucination, Other (See Comments) nightmares     REVIEW OF SYSTEMS (Negative unless checked)   Constitutional: [] Weight loss  [] Fever  [] Chills Cardiac: [] Chest pain   [] Chest pressure   [x] Palpitations   [] Shortness of breath when laying flat   [] Shortness of breath at rest   [] Shortness of breath with exertion. Vascular:  [] Pain in legs with walking   [] Pain in legs at rest   [] Pain in legs when laying flat   [] Claudication   [] Pain in feet when walking  [] Pain in feet at rest  [] Pain in feet when laying flat   [x] History of DVT   [x] Phlebitis   [x] Swelling in legs   [] Varicose veins   [] Non-healing ulcers Pulmonary:   [] Uses home oxygen   [] Productive cough   [] Hemoptysis   [] Wheeze  [x] COPD   [] Asthma Neurologic:  [] Dizziness  [] Blackouts   [] Seizures   [x] History of stroke   [] History of TIA  [] Aphasia   [] Temporary blindness   [] Dysphagia   [] Weakness or numbness in arms   [x] Weakness or numbness in legs Musculoskeletal:  [x] Arthritis   [] Joint swelling   [x] Joint pain   [x] Low back pain Hematologic:  [] Easy bruising  [] Easy bleeding   [] Hypercoagulable state   [] Anemic   Gastrointestinal:  [] Blood in stool   [] Vomiting blood  [x] Gastroesophageal reflux/heartburn   [] Abdominal pain Genitourinary:  [] Chronic kidney disease   [] Difficult urination   [] Frequent urination  [] Burning with urination   [] Hematuria Skin:  [] Rashes   [] Ulcers   [] Wounds Psychological:  [] History of anxiety   []  History of major depression.   Physical Examination  BP 109/69 (BP Location: Right Arm)   Pulse 78   Resp 16  Gen:  WD/WN, NAD Head: [place]/AT, No temporalis wasting. Ear/Nose/Throat: Hearing grossly intact, nares w/o erythema or drainage Eyes: Conjunctiva clear. Sclera non-icteric Neck: Supple.  Trachea midline Pulmonary:  Good air movement, no use of accessory muscles.  Cardiac: RRR, no JVD Vascular:  Vessel Right Left  Radial Palpable Palpable                          PT 1+ Palpable 1+ Palpable  DP 1+ Palpable 1+ Palpable    Musculoskeletal: M/S 5/5 throughout.  No deformity or atrophy. In a wheelchair. Trace LE edema. Neurologic: Sensation grossly intact in extremities.  Symmetrical.  Speech is fluent.  Psychiatric: Judgment intact, Mood & affect appropriate for pt's clinical situation. Dermatologic: No rashes or ulcers noted.  No cellulitis or open wounds.      Labs Recent Results (from the past 2160 hour(s))  Bayer DCA Hb A1c Waived     Status: Abnormal   Collection Time: 09/08/21  9:22 AM  Result Value Ref Range   HB A1C (BAYER DCA - WAIVED) 8.5 (H) 4.8 - 5.6 %    Comment:          Prediabetes: 5.7 - 6.4          Diabetes: >6.4          Glycemic control for adults with diabetes: <7.0   Comprehensive metabolic panel     Status: Abnormal   Collection Time: 09/08/21  9:24 AM  Result Value Ref Range   Glucose 221 (H) 70 - 99 mg/dL   BUN 26 8 - 27 mg/dL   Creatinine, Ser 1.65 (H) 0.57 - 1.00 mg/dL   eGFR 32 (L) >59 mL/min/1.73   BUN/Creatinine Ratio 16 12 - 28   Sodium 142 134 - 144 mmol/L   Potassium 6.3 (H) 3.5 - 5.2 mmol/L   Chloride 103 96 - 106 mmol/L   CO2 25 20 - 29 mmol/L   Calcium 10.2 8.7 - 10.3 mg/dL   Total Protein 6.9 6.0 - 8.5 g/dL   Albumin 4.2 3.7 - 4.7 g/dL    Comment:                **Effective September 19, 2021 Albumin reference interval**                  will be changing to:                             Age                  Female          Female                            0 -   7 days       3.6 - 4.9      3.6 - 4.9                            8 -  30 days       3.5 - 4.6      3.5 - 4.6                            1 -   6 months     3.7 - 4.8      3.7 - 4.8                     7 months -   2 years      4.0 - 5.0      4.0 - 5.0  3 -   5 years      4.1 - 5.0      4.1 - 5.0                            6 -  12 years      4.2 - 5.0      4.2 - 5.0                           13 -  30 years      4.3 - 5.2      4.0 - 5.0                           31 -  50 years      4.1 - 5.1      3.9 - 4.9                           51 -  60 years      3.8 - 4.9      3.8 - 4.9                           61 -  70 years      3.9 - 4.9      3.9 - 4.9                           71 -  80 years      3.8 - 4.8      3.8 - 4.8                           8 1  -  89 years      3.7 - 4.7      3.7 - 4.7                           90 - 199 years      3.6 - 4.6      3.6 - 4.6    Globulin, Total 2.7 1.5 - 4.5 g/dL   Albumin/Globulin Ratio 1.6 1.2 - 2.2   Bilirubin Total 0.3 0.0 - 1.2 mg/dL   Alkaline Phosphatase 88 44 - 121 IU/L   AST 20 0 - 40 IU/L   ALT 17 0 - 32 IU/L  CBC with Differential/Platelet     Status: Abnormal   Collection Time: 09/08/21  9:24 AM  Result Value Ref Range   WBC 8.2 3.4 - 10.8 x10E3/uL   RBC 4.66 3.77 - 5.28 x10E6/uL   Hemoglobin 13.2 11.1 - 15.9 g/dL   Hematocrit 42.5 34.0 - 46.6 %   MCV 91 79 - 97 fL   MCH 28.3 26.6 - 33.0 pg   MCHC 31.1 (L) 31.5 - 35.7 g/dL   RDW 13.9 11.7 - 15.4 %   Platelets 260 150 - 450 x10E3/uL   Neutrophils 55 Not Estab. %   Lymphs 28 Not Estab. %   Monocytes 11 Not Estab. %   Eos 4 Not Estab. %   Basos 1 Not Estab. %   Neutrophils Absolute 4.6 1.4 - 7.0 x10E3/uL   Lymphocytes Absolute 2.3 0.7 - 3.1 x10E3/uL  Monocytes Absolute 0.9 0.1 - 0.9  x10E3/uL   EOS (ABSOLUTE) 0.4 0.0 - 0.4 x10E3/uL   Basophils Absolute 0.1 0.0 - 0.2 x10E3/uL   Immature Granulocytes 1 Not Estab. %   Immature Grans (Abs) 0.1 0.0 - 0.1 x10E3/uL  Lipid Panel w/o Chol/HDL Ratio     Status: Abnormal   Collection Time: 09/08/21  9:24 AM  Result Value Ref Range   Cholesterol, Total 137 100 - 199 mg/dL   Triglycerides 208 (H) 0 - 149 mg/dL   HDL 47 >39 mg/dL   VLDL Cholesterol Cal 34 5 - 40 mg/dL   LDL Chol Calc (NIH) 56 0 - 99 mg/dL  Urinalysis, Routine w reflex microscopic     Status: Abnormal   Collection Time: 09/08/21  9:50 AM  Result Value Ref Range   Specific Gravity, UA 1.025 1.005 - 1.030   pH, UA 6.0 5.0 - 7.5   Color, UA Yellow Yellow   Appearance Ur Cloudy (A) Clear   Leukocytes,UA 1+ (A) Negative   Protein,UA 1+ (A) Negative/Trace   Glucose, UA 3+ (A) Negative   Ketones, UA Negative Negative   RBC, UA 3+ (A) Negative   Bilirubin, UA Negative Negative   Urobilinogen, Ur 0.2 0.2 - 1.0 mg/dL   Nitrite, UA Negative Negative   Microscopic Examination See below:   Microscopic Examination     Status: Abnormal   Collection Time: 09/08/21  9:50 AM   Urine  Result Value Ref Range   WBC, UA >30 (H) 0 - 5 /hpf   RBC, Urine >30 (H) 0 - 2 /hpf   Epithelial Cells (non renal) 0-10 0 - 10 /hpf   Mucus, UA Present (A) Not Estab.   Bacteria, UA Few (A) None seen/Few   Yeast, UA Present (A) None seen  Basic metabolic panel     Status: Abnormal   Collection Time: 09/09/21  4:12 PM  Result Value Ref Range   Glucose 283 (H) 70 - 99 mg/dL   BUN 29 (H) 8 - 27 mg/dL   Creatinine, Ser 1.66 (H) 0.57 - 1.00 mg/dL   eGFR 31 (L) >59 mL/min/1.73   BUN/Creatinine Ratio 17 12 - 28   Sodium 142 134 - 144 mmol/L   Potassium 5.4 (H) 3.5 - 5.2 mmol/L   Chloride 104 96 - 106 mmol/L   CO2 22 20 - 29 mmol/L   Calcium 9.5 8.7 - 10.3 mg/dL  Basic metabolic panel     Status: Abnormal   Collection Time: 09/21/21 10:30 AM  Result Value Ref Range   Glucose 126 (H) 70  - 99 mg/dL   BUN 27 8 - 27 mg/dL   Creatinine, Ser 1.61 (H) 0.57 - 1.00 mg/dL   eGFR 33 (L) >59 mL/min/1.73   BUN/Creatinine Ratio 17 12 - 28   Sodium 141 134 - 144 mmol/L   Potassium 5.3 (H) 3.5 - 5.2 mmol/L   Chloride 102 96 - 106 mmol/L   CO2 24 20 - 29 mmol/L   Calcium 9.5 8.7 - 10.3 mg/dL  CBC w/Diff     Status: None   Collection Time: 09/21/21 10:30 AM  Result Value Ref Range   WBC 8.4 3.4 - 10.8 x10E3/uL   RBC 4.68 3.77 - 5.28 x10E6/uL   Hemoglobin 13.4 11.1 - 15.9 g/dL   Hematocrit 41.7 34.0 - 46.6 %   MCV 89 79 - 97 fL   MCH 28.6 26.6 - 33.0 pg   MCHC 32.1 31.5 - 35.7 g/dL  RDW 13.4 11.7 - 15.4 %   Platelets 291 150 - 450 x10E3/uL   Neutrophils 56 Not Estab. %   Lymphs 27 Not Estab. %   Monocytes 11 Not Estab. %   Eos 4 Not Estab. %   Basos 1 Not Estab. %   Neutrophils Absolute 4.8 1.4 - 7.0 x10E3/uL   Lymphocytes Absolute 2.3 0.7 - 3.1 x10E3/uL   Monocytes Absolute 0.9 0.1 - 0.9 x10E3/uL   EOS (ABSOLUTE) 0.3 0.0 - 0.4 x10E3/uL   Basophils Absolute 0.1 0.0 - 0.2 x10E3/uL   Immature Granulocytes 1 Not Estab. %   Immature Grans (Abs) 0.1 0.0 - 0.1 P10C5/EN  Basic metabolic panel     Status: Abnormal   Collection Time: 09/26/21  9:47 AM  Result Value Ref Range   Sodium 141 135 - 145 mmol/L   Potassium 4.9 3.5 - 5.1 mmol/L   Chloride 110 98 - 111 mmol/L   CO2 25 22 - 32 mmol/L   Glucose, Bld 149 (H) 70 - 99 mg/dL    Comment: Glucose reference range applies only to samples taken after fasting for at least 8 hours.   BUN 29 (H) 8 - 23 mg/dL   Creatinine, Ser 1.91 (H) 0.44 - 1.00 mg/dL   Calcium 9.3 8.9 - 10.3 mg/dL   GFR, Estimated 27 (L) >60 mL/min    Comment: (NOTE) Calculated using the CKD-EPI Creatinine Equation (2021)    Anion gap 6 5 - 15    Comment: Performed at [facility], [address] [place], [place] [address]  Glucose, capillary     Status: Abnormal   Collection Time: 09/26/21 11:30 AM  Result Value Ref Range   Glucose-Capillary 105  (H) 70 - 99 mg/dL    Comment: Glucose reference range applies only to samples taken after fasting for at least 8 hours.    Radiology VAS [place] ABI WITH/WO TBI  Result Date: 10/06/2021  LOWER EXTREMITY DOPPLER STUDY Patient Name:  AMARIA MUNDORF  Date of Exam:   10/04/2021 Medical Rec #: [id number]    Accession #:    [id number] Date of Birth: 10/06/1945     Patient Gender: F Patient Age:   49 years Exam Location:  [facility] Procedure:      VAS [place] ABI WITH/WO TBI Referring Phys: Corene Cornea Jamerion Cabello --------------------------------------------------------------------------------  Indications: Peripheral artery disease. High Risk Factors: Hypertension, Diabetes, past history of smoking, coronary                    artery disease. Other Factors: Patient complains of one month of right calf claudication.  Vascular Interventions: 07/11/10: Right superficial femoral artery PTA;                         07/16/11: Left superficial femoral artery PTA. Performing Technologist: Delorise Shiner RVT  Examination Guidelines: A complete evaluation includes at minimum, Doppler waveform signals and systolic blood pressure reading at the level of bilateral brachial, anterior tibial, and posterior tibial arteries, when vessel segments are accessible. Bilateral testing is considered an integral part of a complete examination. Photoelectric Plethysmograph (PPG) waveforms and toe systolic pressure readings are included as required and additional duplex testing as needed. Limited examinations for reoccurring indications may be performed as noted.  ABI Findings: +---------+------------------+-----+----------+--------+ Right    Rt Pressure (mmHg)IndexWaveform  Comment  +---------+------------------+-----+----------+--------+ Brachial 134                                       +---------+------------------+-----+----------+--------+  ATA      102               0.76 monophasic          +---------+------------------+-----+----------+--------+ PTA      100               0.75 biphasic           +---------+------------------+-----+----------+--------+ Great Toe58                0.43                    +---------+------------------+-----+----------+--------+ +---------+------------------+-----+--------+----------------------------------+ Left     Lt Pressure (mmHg)IndexWaveformComment                            +---------+------------------+-----+--------+----------------------------------+ Brachial                                Patient refused left arm brachial                                          pressure secondary to history of                                           cancer                             +---------+------------------+-----+--------+----------------------------------+ ATA      93                0.69 biphasic                                   +---------+------------------+-----+--------+----------------------------------+ PTA      101               0.75 biphasic                                   +---------+------------------+-----+--------+----------------------------------+ Great Toe68                0.51                                            +---------+------------------+-----+--------+----------------------------------+ +-------+-----------+-----------+------------+------------+ ABI/TBIToday's ABIToday's TBIPrevious ABIPrevious TBI +-------+-----------+-----------+------------+------------+ Right  0.76       0.43       0.93        0.73         +-------+-----------+-----------+------------+------------+ Left   0.75       0.51       0.88        0.69         +-------+-----------+-----------+------------+------------+ Bilateral ABIs appear decreased compared to prior study on 10/05/2020.  Summary: Right: Resting right ankle-brachial index indicates moderate right lower extremity arterial disease. The right  toe-brachial index is abnormal. Limited imaging of the SFA showed no obvious hemodynamically significant stenosis. Left: Resting left ankle-brachial index indicates  moderate left lower extremity arterial disease. The left toe-brachial index is abnormal. *See table(s) above for measurements and observations.  Electronically signed by Leotis Pain MD on 10/06/2021 at 67:17:42 AM.    Final     Assessment/Plan Hyperlipidemia lipid control important in reducing the progression of atherosclerotic disease. Continue statin therapy     Diabetes mellitus type 2 with complications, uncontrolled blood glucose control important in reducing the progression of atherosclerotic disease. Also, involved in wound healing. On appropriate medications.  Essential hypertension blood pressure control important in reducing the progression of atherosclerotic disease. On appropriate oral medications.   Atherosclerosis of native arteries of extremity with intermittent claudication (HCC) ABIs have dropped to 0.76 on the right and 0.75 on the left from her studies a year ago.  She is having more right leg claudication symptoms currently.  We discussed the pathophysiology and natural history of peripheral arterial disease today.  This is the leg we intervened upon about a decade ago.  She is having more issues from shingles and other things at this point does not want to undergo any procedure, but we have discussed a shortened interval follow-up and I will plan to see her back in about 3 months with noninvasive studies.  Stage 3b chronic kidney disease ([place]) Hydrate and limit contrast if any procedures are performed.    Leotis Pain, MD  11/01/2021 10:26 AM    This note was created with Dragon medical transcription system.  Any errors from dictation are purely unintentional

## 2021-11-07 ENCOUNTER — Telehealth: Payer: Self-pay | Admitting: Family Medicine

## 2021-11-07 NOTE — Telephone Encounter (Signed)
Arbutus Ped, pt daughter called and stated that she would like a call back from  Dr. Durenda Age nurse or CMA to discuss declining health concerns for patient. Arbutus Ped best contact [phone].

## 2021-11-07 NOTE — Telephone Encounter (Signed)
Returned call to patient daughter Tammy [name] on [place], she is concerned about her mothers health. Patient is in a lot of pain, is not getting better at all. Advised patient daughter MRI is scheduled for next Tuesday, and patient has appointment tomorrow with PCP. Tammy [name] would like to ask Dr. Wynetta Emery if she can order home health, or talk to patient about it because she is having trouble doing things around the house and patients husband works full time so he is not there with her all day.

## 2021-11-08 ENCOUNTER — Ambulatory Visit (INDEPENDENT_AMBULATORY_CARE_PROVIDER_SITE_OTHER): Payer: Medicare Other | Admitting: Family Medicine

## 2021-11-08 ENCOUNTER — Encounter: Payer: Self-pay | Admitting: Family Medicine

## 2021-11-08 VITALS — BP 116/72 | HR 80 | Temp 97.6°F | Wt 214.2 lb

## 2021-11-08 DIAGNOSIS — M4724 Other spondylosis with radiculopathy, thoracic region: Secondary | ICD-10-CM

## 2021-11-08 DIAGNOSIS — M4722 Other spondylosis with radiculopathy, cervical region: Secondary | ICD-10-CM | POA: Diagnosis not present

## 2021-11-08 DIAGNOSIS — I200 Unstable angina: Secondary | ICD-10-CM

## 2021-11-08 DIAGNOSIS — R300 Dysuria: Secondary | ICD-10-CM | POA: Diagnosis not present

## 2021-11-08 DIAGNOSIS — J069 Acute upper respiratory infection, unspecified: Secondary | ICD-10-CM | POA: Diagnosis not present

## 2021-11-08 MED ORDER — GABAPENTIN 300 MG PO CAPS: 600.0000 mg | ORAL_CAPSULE | Freq: Three times a day (TID) | ORAL | 3 refills | Status: DC

## 2021-11-08 MED ORDER — PREDNISONE 20 MG PO TABS: 20.0000 mg | ORAL_TABLET | Freq: Every day | ORAL | 0 refills | Status: DC

## 2021-11-08 MED ORDER — OXYCODONE-ACETAMINOPHEN 5-325 MG PO TABS: 0.5000 | ORAL_TABLET | Freq: Three times a day (TID) | ORAL | 0 refills | Status: DC | PRN

## 2021-11-08 NOTE — Progress Notes (Unsigned)
BP 116/72   Pulse 80   Temp 97.6 F (36.4 C)   Wt 214 lb 3.2 oz (97.2 kg)   SpO2 90%   BMI 34.57 kg/m    Subjective:    Patient ID: Tammy [name], female    DOB: 1945/10/04, 76 y.o.   MRN: [id number]  HPI: Tammy [name] is a 76 y.o. female  Chief Complaint  Patient presents with   left sided chest pain    Patient states she is still having the same pain, pain seems to be getting worse. Has an area on her left breast that hurts to the touch.    Back Pain    Per patient she is still having a lot of pain.    Cough    Patient states she has been coughing for about a week, causing more pain to her side. Patient states is coughing up a lot of phlegm.    Urinary Tract Infection    Patient states her urine is cloudy, and has burning when she's done urinating.     Chest pain is no better. Hurts to move her arm. In a lot of pain. Gabapentin helps a little. Pain medicine also helps a bit. Coughing makes it a lot worse. She is really not feeling well at all.   UPPER RESPIRATORY TRACT INFECTION Duration: about a week Worst symptom: cough Fever: yes Cough: yes Shortness of breath: yes Wheezing: no Chest pain: yes Chest tightness: yes Chest congestion: no Nasal congestion: no Runny nose: yes Post nasal drip: no Sneezing: no Sore throat: no Swollen glands: no Sinus pressure: no Headache: no Face pain: no Toothache: no Ear pain: no  Ear pressure: yes  Eyes red/itching:no Eye drainage/crusting: no  Vomiting: no Rash: no Fatigue: yes Sick contacts: yes Strep contacts: no  Context: better Recurrent sinusitis: no Relief with OTC cold/cough medications: no  Treatments attempted: none   URINARY SYMPTOMS Duration: chronic Dysuria: burning Urinary frequency: yes Urgency: yes Small volume voids: yes Symptom severity: severe Urinary incontinence: yes Foul odor: yes Hematuria: orange about a week ago Abdominal pain: no Back pain: yes Suprapubic pain/pressure: no Flank  pain: no Fever:  yes Vomiting: yes Relief with cranberry juice: no Relief with pyridium: no Status: stable History of sexually transmitted disease: no Vaginal discharge: yes Treatments attempted: increasing fluids    Relevant past medical, surgical, family and social history reviewed and updated as indicated. Interim medical history since our last visit reviewed. Allergies and medications reviewed and updated.  Review of Systems  Constitutional:  Positive for chills, diaphoresis, fatigue and fever. Negative for activity change, appetite change and unexpected weight change.  HENT:  Positive for congestion, postnasal drip, rhinorrhea, sinus pressure, sneezing and sore throat. Negative for dental problem, drooling, ear discharge, ear pain, facial swelling, hearing loss, mouth sores, nosebleeds, sinus pain, tinnitus, trouble swallowing and voice change.   Respiratory:  Positive for cough, chest tightness and wheezing. Negative for apnea, choking, shortness of breath and stridor.   Cardiovascular:  Positive for chest pain. Negative for palpitations and leg swelling.  Gastrointestinal: Negative.   Musculoskeletal:  Positive for back pain, myalgias, neck pain and neck stiffness. Negative for arthralgias, gait problem and joint swelling.  Skin: Negative.   Neurological: Negative.   Psychiatric/Behavioral: Negative.      Per HPI unless specifically indicated above     Objective:    BP 116/72   Pulse 80   Temp 97.6 F (36.4 C)   Wt 214 lb  3.2 oz (97.2 kg)   SpO2 90%   BMI 34.57 kg/m   Wt Readings from Last 3 Encounters:  11/08/21 214 lb 3.2 oz (97.2 kg)  10/25/21 216 lb 12.8 oz (98.3 kg)  10/18/21 217 lb 1.6 oz (98.5 kg)    Physical Exam Vitals and nursing note reviewed.  Constitutional:      General: She is not in acute distress.    Appearance: Normal appearance. She is obese. She is not ill-appearing, toxic-appearing or diaphoretic.  HENT:     Head: Normocephalic and  atraumatic.     Right Ear: Tympanic membrane, ear canal and external ear normal. There is no impacted cerumen.     Left Ear: Tympanic membrane, ear canal and external ear normal. There is no impacted cerumen.     Nose: Congestion and rhinorrhea present.     Mouth/Throat:     Mouth: Mucous membranes are moist.     Pharynx: Oropharynx is clear. No oropharyngeal exudate or posterior oropharyngeal erythema.  Eyes:     General: No scleral icterus.       Right eye: No discharge.        Left eye: No discharge.     Extraocular Movements: Extraocular movements intact.     Conjunctiva/sclera: Conjunctivae normal.     Pupils: Pupils are equal, round, and reactive to light.  Neck:     Vascular: No carotid bruit.  Cardiovascular:     Rate and Rhythm: Normal rate and regular rhythm.     Pulses: Normal pulses.     Heart sounds: Normal heart sounds. No murmur heard.    No friction rub. No gallop.  Pulmonary:     Effort: Pulmonary effort is normal. No respiratory distress.     Breath sounds: Normal breath sounds. No stridor. No wheezing, rhonchi or rales.  Chest:     Chest wall: No tenderness.  Musculoskeletal:        General: Normal range of motion.     Cervical back: Normal range of motion and neck supple. No rigidity or tenderness.  Lymphadenopathy:     Cervical: No cervical adenopathy.  Skin:    General: Skin is warm and dry.     Capillary Refill: Capillary refill takes less than 2 seconds.     Coloration: Skin is not jaundiced or pale.     Findings: No bruising, erythema, lesion or rash.  Neurological:     General: No focal deficit present.     Mental Status: She is alert and oriented to person, place, and time. Mental status is at baseline.  Psychiatric:        Mood and Affect: Mood normal.        Behavior: Behavior normal.        Thought Content: Thought content normal.        Judgment: Judgment normal.     Results for orders placed or performed in visit on 11/08/21  Rapid Strep  screen([facility])   Specimen: Other   Other  Result Value Ref Range   Strep Gp A Ag, IA W/Reflex Negative Negative  Culture, Group A Strep   Other  Result Value Ref Range   Strep A Culture WILL FOLLOW   Veritor Flu A/B Waived  Result Value Ref Range   Influenza A Negative Negative   Influenza B Negative Negative      Assessment & Plan:   Problem List Items Addressed This Visit       Nervous and Auditory   Osteoarthritis  of spine with radiculopathy, cervical region    To have MRI next week. Has not seen PM&R yet. Will increase her gabapentin to 600mg  TID and give refill of her pain medicine. Await results. Treat as needed.       Relevant Medications   gabapentin (NEURONTIN) 300 MG capsule   oxyCODONE-acetaminophen (PERCOCET/ROXICET) 5-325 MG tablet   predniSONE (DELTASONE) 20 MG tablet   Osteoarthritis of spine with radiculopathy, thoracic region    To have MRI next week. Has not seen PM&R yet. Will increase her gabapentin to 600mg  TID and give refill of her pain medicine. Await results. Treat as needed.       Relevant Medications   gabapentin (NEURONTIN) 300 MG capsule   oxyCODONE-acetaminophen (PERCOCET/ROXICET) 5-325 MG tablet   predniSONE (DELTASONE) 20 MG tablet   Other Visit Diagnoses     Upper respiratory tract infection, unspecified type    -  Primary   Husband has been exposed to covid. Will swab. Treat with prednisone. Call with any concerns.    Relevant Orders   Influenza a and b   Novel Coronavirus, NAA ([facility])   Rapid Strep screen([facility]) (Completed)   Dysuria       Will check urine- will drop it back off and treat as able.    Relevant Orders   Urinalysis, Routine w reflex microscopic        Follow up plan: Return in about 4 weeks (around 12/06/2021).

## 2021-11-08 NOTE — Assessment & Plan Note (Signed)
To have MRI next week. Has not seen PM&R yet. Will increase her gabapentin to 600mg  TID and give refill of her pain medicine. Await results. Treat as needed.

## 2021-11-10 ENCOUNTER — Other Ambulatory Visit: Payer: Self-pay | Admitting: Family Medicine

## 2021-11-10 DIAGNOSIS — E1159 Type 2 diabetes mellitus with other circulatory complications: Secondary | ICD-10-CM

## 2021-11-10 DIAGNOSIS — Z794 Long term (current) use of insulin: Secondary | ICD-10-CM

## 2021-11-10 DIAGNOSIS — J449 Chronic obstructive pulmonary disease, unspecified: Secondary | ICD-10-CM

## 2021-11-10 DIAGNOSIS — I10 Essential (primary) hypertension: Secondary | ICD-10-CM

## 2021-11-10 LAB — NOVEL CORONAVIRUS, NAA: SARS-CoV-2, NAA: NOT DETECTED

## 2021-11-11 LAB — RAPID STREP SCREEN (MED CTR MEBANE ONLY): Strep Gp A Ag, IA W/Reflex: NEGATIVE

## 2021-11-11 LAB — VERITOR FLU A/B WAIVED
Influenza A: NEGATIVE
Influenza B: NEGATIVE

## 2021-11-11 LAB — CULTURE, GROUP A STREP: Strep A Culture: NEGATIVE

## 2021-11-11 NOTE — Telephone Encounter (Signed)
Requested Prescriptions  Pending Prescriptions Disp Refills  . montelukast (SINGULAIR) 10 MG tablet [Pharmacy Med Name: MONTELUKAST SODIUM 10 MG TAB] 90 tablet 0    Sig: TAKE 1 TABLET BY MOUTH AT BEDTIME     Pulmonology:  Leukotriene Inhibitors Passed - 11/10/2021  3:01 PM      Passed - Valid encounter within last 12 months    Recent Outpatient Visits          3 days ago Upper respiratory tract infection, unspecified type   [place], Megan P, DO   3 weeks ago Intermittent left-sided chest pain   [place], Megan P, DO   4 weeks ago Intermittent left-sided chest pain   [place], NP   2 months ago Left-sided chest pain   [place], Megan P, DO   2 months ago Intermittent left-sided chest pain   [place] V[place], Avanti, MD      Future Appointments            In 3 weeks Wynetta Emery, Barb Merino, DO [place], [place]   In 1 month  [employer], [facility]

## 2021-11-15 ENCOUNTER — Ambulatory Visit
Admission: RE | Admit: 2021-11-15 | Discharge: 2021-11-15 | Disposition: A | Discharge status: Home / Self Care | Payer: Medicare Other | Source: Ambulatory Visit | Attending: Family Medicine | Admitting: Family Medicine

## 2021-11-15 DIAGNOSIS — M4722 Other spondylosis with radiculopathy, cervical region: Secondary | ICD-10-CM | POA: Diagnosis not present

## 2021-11-15 DIAGNOSIS — M5134 Other intervertebral disc degeneration, thoracic region: Secondary | ICD-10-CM | POA: Diagnosis not present

## 2021-11-15 DIAGNOSIS — M5414 Radiculopathy, thoracic region: Secondary | ICD-10-CM | POA: Diagnosis not present

## 2021-11-15 DIAGNOSIS — M5412 Radiculopathy, cervical region: Secondary | ICD-10-CM | POA: Diagnosis not present

## 2021-11-15 DIAGNOSIS — M4802 Spinal stenosis, cervical region: Secondary | ICD-10-CM | POA: Diagnosis not present

## 2021-11-15 DIAGNOSIS — M4724 Other spondylosis with radiculopathy, thoracic region: Secondary | ICD-10-CM | POA: Diagnosis not present

## 2021-11-17 ENCOUNTER — Telehealth: Payer: Self-pay

## 2021-11-17 ENCOUNTER — Ambulatory Visit: Payer: Self-pay

## 2021-11-17 NOTE — Telephone Encounter (Signed)
Cannnot move arm without severe pain pick anything up Breast pain hurts to breathe pain that goes though breast SOB with exertion.  Left arm affected 6 weeks ago  Pain breast= 6/10 aching hurts with breathing Axilla- moving stabbing pain goes down arm to elbow 1 spot in back size of pencil head and hurts to lean against anything like recliner Severe pain Center of chest  Oxycodone-helps and 2 ED Tylenol heases not gone  Hurts with breathing  Cough or sneezing  Reason for Disposition . [1] Breast pain AND [2] cause is not known . Taking a deep breath makes pain worse  Answer Assessment - Initial Assessment Questions 1. SYMPTOM: "What's the main symptom you're concerned about?"  (e.g., lump, pain, rash, nipple discharge)     pain 2. LOCATION: "Where is the pain located?"     Left breast  3. ONSET: "When did unsure ? 6 weeks ago  start?"     *No Answer* 4. PRIOR HISTORY: "Do you have any history of prior problems with your breasts?" (e.g., lumps, cancer, fibrocystic breast disease)     None noted 5. CAUSE: "What do you think is causing this symptom?"     *No Answer* 6. OTHER SYMPTOMS: "Do you have any other symptoms?" (e.g., fever, breast pain, redness or rash, nipple discharge)     Breast pain 7. PREGNANCY-BREASTFEEDING: "Is there any chance you are pregnant?" "When was your last menstrual period?" "Are you breastfeeding?"     N/a  Answer Assessment - Initial Assessment Questions 1. LOCATION: "Where does it hurt?"       Left side under or near axilla, occasionally center of chest 2. RADIATION: "Does the pain go anywhere else?" (e.g., into neck, jaw, arms, back)     no 3. ONSET: "When did the chest pain begin?" (Minutes, hours or days)      6 weeks ago 4. PATTERN: "Does the pain come and go, or has it been constant since it started?"  "Does it get worse with exertion?"      constnt 5. DURATION: "How long does it last" (e.g., seconds, minutes, hours)     consnta 6. SEVERITY:  "How bad is the pain?"  (e.g., Scale 1-10; mild, moderate, or severe)    - MILD (1-3): doesn't interfere with normal activities     - MODERATE (4-7): interferes with normal activities or awakens from sleep    - SEVERE (8-10): excruciating pain, unable to do any normal activities       Moderate to severe 7. CARDIAC RISK FACTORS: "Do you have any history of heart problems or risk factors for heart disease?" (e.g., angina, prior heart attack; diabetes, high blood pressure, high cholesterol, smoker, or strong family history of heart disease)     HTN 8. PULMONARY RISK FACTORS: "Do you have any history of lung disease?"  (e.g., blood clots in lung, asthma, emphysema, birth control pills)     COPD, Pulmonary embolism 9. CAUSE: "What do you think is causing the chest pain?"     unknown 10. OTHER SYMPTOMS: "Do you have any other symptoms?" (e.g., dizziness, nausea, vomiting, sweating, fever, difficulty breathing, cough)       Chest hurts with breathing 11. PREGNANCY: "Is there any chance you are pregnant?" "When was your last menstrual period?"       N/a  Answer Assessment - Initial Assessment Questions 1. ONSET: "When did the pain start?"     6 weeks ago 2. LOCATION: "Where is the pain located?"  From left axilla to left elbow 3. PAIN: "How bad is the pain?" (Scale 1-10; or mild, moderate, severe)   - MILD (1-3): Doesn't interfere with normal activities.   - MODERATE (4-7): Interferes with normal activities (e.g., work or school) or awakens from sleep.   - SEVERE (8-10): Excruciating pain, unable to do any normal activities, unable to hold a cup of water.     Mod to severe 4. WORK OR EXERCISE: "Has there been any recent work or exercise that involved this part of the body?"     no 5. CAUSE: "What do you think is causing the arm pain?"     unknown 6. OTHER SYMPTOMS: "Do you have any other symptoms?" (e.g., neck pain, swelling, rash, fever, numbness, weakness)     Pain with raising or using  left arm, occasional chest apin worsened with breathing, hurts to touch  7. PREGNANCY: "Is there any chance you are pregnant?" "When was your last menstrual period?"     N/a  Protocols used: Arm Pain-A-AH, Breast Symptoms-A-AH, Chest Pain-A-AH

## 2021-11-17 NOTE — Telephone Encounter (Signed)
Copied from [place] [id number]. Topic: General - Inquiry >> Nov 17, 2021 11:33 AM Penni Bombard wrote: Reason for CRM: Pt called saying she has not heard back from her MRI's last week.  CB@  [phone]

## 2021-11-17 NOTE — Telephone Encounter (Signed)
Returned patient call and notified of MRI Results. Patient verbally understood.

## 2021-11-17 NOTE — Telephone Encounter (Signed)
Patient is aware of provider advise, states she is not going to ED because she will have to wait hours. Patient is very upset and frustrated due to pain, and states no one is figuring out what is wrong with her. Patient states MRI's were pointless because they were of her back and neck, and patient has pain in her left breast under her arm. Assured patient provider will be calling her with more results on MRI.

## 2021-11-17 NOTE — Telephone Encounter (Signed)
Needs to go to ER if in that much pain

## 2021-11-21 DIAGNOSIS — I10 Essential (primary) hypertension: Secondary | ICD-10-CM | POA: Diagnosis not present

## 2021-11-21 DIAGNOSIS — D519 Vitamin B12 deficiency anemia, unspecified: Secondary | ICD-10-CM | POA: Diagnosis not present

## 2021-11-21 DIAGNOSIS — Z79899 Other long term (current) drug therapy: Secondary | ICD-10-CM | POA: Diagnosis not present

## 2021-11-21 DIAGNOSIS — E039 Hypothyroidism, unspecified: Secondary | ICD-10-CM | POA: Diagnosis not present

## 2021-11-21 DIAGNOSIS — E782 Mixed hyperlipidemia: Secondary | ICD-10-CM | POA: Diagnosis not present

## 2021-11-21 DIAGNOSIS — E119 Type 2 diabetes mellitus without complications: Secondary | ICD-10-CM | POA: Diagnosis not present

## 2021-11-21 DIAGNOSIS — M25519 Pain in unspecified shoulder: Secondary | ICD-10-CM | POA: Diagnosis not present

## 2021-11-21 DIAGNOSIS — E559 Vitamin D deficiency, unspecified: Secondary | ICD-10-CM | POA: Diagnosis not present

## 2021-11-22 ENCOUNTER — Other Ambulatory Visit: Payer: Self-pay | Admitting: Family Medicine

## 2021-11-22 DIAGNOSIS — M25512 Pain in left shoulder: Secondary | ICD-10-CM

## 2021-11-23 ENCOUNTER — Ambulatory Visit: Payer: Self-pay

## 2021-11-23 LAB — BASIC METABOLIC PANEL
BUN: 24 — AB (ref 4–21)
CO2: 32 — AB (ref 13–22)
Chloride: 103 (ref 99–108)
Creatinine: 1.7 — AB (ref 0.5–1.1)
Glucose: 114
Potassium: 5.5 mEq/L — AB (ref 3.5–5.1)
Sodium: 143 (ref 137–147)

## 2021-11-23 LAB — LIPID PANEL
Cholesterol: 126 (ref 0–200)
HDL: 45 (ref 35–70)
LDL Cholesterol: 54
Triglycerides: 203 — AB (ref 40–160)

## 2021-11-23 LAB — COMPREHENSIVE METABOLIC PANEL
Albumin: 4 (ref 3.5–5.0)
Calcium: 10.1 (ref 8.7–10.7)
Globulin: 3.2
eGFR: 32

## 2021-11-23 LAB — HEPATIC FUNCTION PANEL
ALT: 10 U/L (ref 7–35)
AST: 15 (ref 13–35)
Alkaline Phosphatase: 76 (ref 25–125)
Bilirubin, Total: 0.5

## 2021-11-23 LAB — TSH: TSH: 0.63 (ref 0.41–5.90)

## 2021-11-23 LAB — HEMOGLOBIN A1C: Hemoglobin A1C: 7.5

## 2021-11-23 LAB — VITAMIN D 25 HYDROXY (VIT D DEFICIENCY, FRACTURES): Vit D, 25-Hydroxy: 45

## 2021-11-23 LAB — CBC AND DIFFERENTIAL
HCT: 44 (ref 36–46)
Hemoglobin: 14.4 (ref 12.0–16.0)
Platelets: 353 10*3/uL (ref 150–400)
WBC: 11.6

## 2021-11-23 LAB — POCT ERYTHROCYTE SEDIMENTATION RATE, NON-AUTOMATED: Sed Rate: 45

## 2021-11-23 NOTE — Patient Outreach (Signed)
  Care Coordination   Follow Up Visit Note   11/23/2021 Name: Tammy [name] MRN: [id number] DOB: 12/23/1945  Tammy [name] is a 76 y.o. year old female who sees [name], DO for primary care. I spoke with  Tammy [name] by phone today.  What matters to the patients health and wellness today?  To find a reason for the pain she is having in her left breast and under her arm    Goals Addressed             This Visit's Progress    RNCM: Effective Management of pain       Care Coordination Interventions: Reviewed provider established plan for pain management. The patient has been dealing with acute pain in her left arm breast area for over 6 weeks. She has had an MRI of her spine and also been to several providers. She is going to [facility] to have an MRI of her arm and breast area on the left side from a provider she met recently that listened to her concerns. The patient states that she just wants answers to why she is having so much pain and discomfort in that area. She says she can hardly stand the pain Discussed importance of adherence to all scheduled medical appointments. Going to the eye doctor today to see about her surgery for removal of her cataracts in November. She has follow up with pcp on 12-06-2021 Counseled on the importance of reporting any/all new or changed pain symptoms or management strategies to pain management provider Advised patient to report to care team affect of pain on daily activities Discussed use of relaxation techniques and/or diversional activities to assist with pain reduction (distraction, imagery, relaxation, massage, acupressure, TENS, heat, and cold application Reviewed with patient prescribed pharmacological and nonpharmacological pain relief strategies Advised patient to discuss unresolved pain, changes in level and intensity of pain, and recommendations for finding direct reason for the pain and discomfort with provider Screening for signs and symptoms of  depression related to chronic disease state  Assessed social determinant of health barriers  Active listening / Reflection utilized  Emotional Support Provided         SDOH assessments and interventions completed:  Yes  SDOH Interventions Today    Flowsheet Row Most Recent Value  SDOH Interventions   Housing Interventions Intervention Not Indicated  Transportation Interventions Intervention Not Indicated  Utilities Interventions Intervention Not Indicated        Care Coordination Interventions Activated:  Yes  Care Coordination Interventions:  Yes, provided   Follow up plan: Follow up call scheduled for 01-11-2022 at 1030 am    Encounter Outcome:  Pt. Visit Completed   Noreene Larsson RN, MSN, [place] [facility] Mobile: [phone]

## 2021-11-23 NOTE — Patient Instructions (Signed)
Visit Information  Thank you for taking time to visit with me today. Please don't hesitate to contact me if I can be of assistance to you.   Following are the goals we discussed today:   Goals Addressed             This Visit's Progress    RNCM: Effective Management of pain       Care Coordination Interventions: Reviewed provider established plan for pain management. The patient has been dealing with acute pain in her left arm breast area for over 6 weeks. She has had an MRI of her spine and also been to several providers. She is going to [facility] to have an MRI of her arm and breast area on the left side from a provider she met recently that listened to her concerns. The patient states that she just wants answers to why she is having so much pain and discomfort in that area. She says she can hardly stand the pain Discussed importance of adherence to all scheduled medical appointments. Going to the eye doctor today to see about her surgery for removal of her cataracts in November. She has follow up with pcp on 12-06-2021 Counseled on the importance of reporting any/all new or changed pain symptoms or management strategies to pain management provider Advised patient to report to care team affect of pain on daily activities Discussed use of relaxation techniques and/or diversional activities to assist with pain reduction (distraction, imagery, relaxation, massage, acupressure, TENS, heat, and cold application Reviewed with patient prescribed pharmacological and nonpharmacological pain relief strategies Advised patient to discuss unresolved pain, changes in level and intensity of pain, and recommendations for finding direct reason for the pain and discomfort with provider Screening for signs and symptoms of depression related to chronic disease state  Assessed social determinant of health barriers  Active listening / Reflection utilized  Emotional Support Provided         Our next  appointment is by telephone on 01-11-2022 at 1030 am  Please call the care guide team at [phone] if you need to cancel or reschedule your appointment.   If you are experiencing a Mental Health or [place] or need someone to talk to, please call the Suicide and Crisis Lifeline: 988 call the [place] National Suicide Prevention Lifeline: 539-707-7241 or TTY: 316 546 2350 TTY (939)278-1856) to talk to a trained counselor call 1-800-273-TALK (toll free, 24 hour hotline)  Patient verbalizes understanding of instructions and care plan provided today and agrees to view in [place]. Active MyChart status and patient understanding of how to access instructions and care plan via MyChart confirmed with patient.     Telephone follow up appointment with care management team member scheduled for: 01-11-2022 at 21 am  [place], MSN, [place] [facility] Mobile: [phone]

## 2021-11-29 ENCOUNTER — Ambulatory Visit: Payer: Medicare Other

## 2021-11-30 DIAGNOSIS — M19012 Primary osteoarthritis, left shoulder: Secondary | ICD-10-CM | POA: Diagnosis not present

## 2021-11-30 DIAGNOSIS — M25512 Pain in left shoulder: Secondary | ICD-10-CM | POA: Diagnosis not present

## 2021-11-30 DIAGNOSIS — N644 Mastodynia: Secondary | ICD-10-CM | POA: Diagnosis not present

## 2021-11-30 DIAGNOSIS — M67814 Other specified disorders of tendon, left shoulder: Secondary | ICD-10-CM | POA: Diagnosis not present

## 2021-11-30 DIAGNOSIS — C50919 Malignant neoplasm of unspecified site of unspecified female breast: Secondary | ICD-10-CM | POA: Diagnosis not present

## 2021-12-03 DIAGNOSIS — N644 Mastodynia: Secondary | ICD-10-CM | POA: Diagnosis not present

## 2021-12-03 DIAGNOSIS — M5030 Other cervical disc degeneration, unspecified cervical region: Secondary | ICD-10-CM | POA: Diagnosis not present

## 2021-12-03 DIAGNOSIS — R922 Inconclusive mammogram: Secondary | ICD-10-CM | POA: Diagnosis not present

## 2021-12-03 DIAGNOSIS — R928 Other abnormal and inconclusive findings on diagnostic imaging of breast: Secondary | ICD-10-CM | POA: Diagnosis not present

## 2021-12-03 DIAGNOSIS — R918 Other nonspecific abnormal finding of lung field: Secondary | ICD-10-CM | POA: Diagnosis not present

## 2021-12-03 DIAGNOSIS — M5134 Other intervertebral disc degeneration, thoracic region: Secondary | ICD-10-CM | POA: Diagnosis not present

## 2021-12-03 DIAGNOSIS — J439 Emphysema, unspecified: Secondary | ICD-10-CM | POA: Diagnosis not present

## 2021-12-05 ENCOUNTER — Other Ambulatory Visit: Payer: Self-pay | Admitting: Family Medicine

## 2021-12-06 ENCOUNTER — Encounter: Payer: Self-pay | Admitting: Family Medicine

## 2021-12-06 ENCOUNTER — Ambulatory Visit (INDEPENDENT_AMBULATORY_CARE_PROVIDER_SITE_OTHER): Payer: Medicare Other | Admitting: Family Medicine

## 2021-12-06 ENCOUNTER — Telehealth: Payer: Self-pay

## 2021-12-06 VITALS — BP 119/77 | HR 76 | Temp 98.5°F | Wt 214.0 lb

## 2021-12-06 DIAGNOSIS — E1122 Type 2 diabetes mellitus with diabetic chronic kidney disease: Secondary | ICD-10-CM | POA: Diagnosis not present

## 2021-12-06 DIAGNOSIS — E1165 Type 2 diabetes mellitus with hyperglycemia: Secondary | ICD-10-CM

## 2021-12-06 DIAGNOSIS — N183 Chronic kidney disease, stage 3 unspecified: Secondary | ICD-10-CM

## 2021-12-06 DIAGNOSIS — I200 Unstable angina: Secondary | ICD-10-CM | POA: Diagnosis not present

## 2021-12-06 DIAGNOSIS — Z794 Long term (current) use of insulin: Secondary | ICD-10-CM

## 2021-12-06 DIAGNOSIS — I10 Essential (primary) hypertension: Secondary | ICD-10-CM | POA: Diagnosis not present

## 2021-12-06 DIAGNOSIS — E875 Hyperkalemia: Secondary | ICD-10-CM | POA: Diagnosis not present

## 2021-12-06 DIAGNOSIS — T148XXA Other injury of unspecified body region, initial encounter: Secondary | ICD-10-CM

## 2021-12-06 DIAGNOSIS — S2242XA Multiple fractures of ribs, left side, initial encounter for closed fracture: Secondary | ICD-10-CM | POA: Diagnosis not present

## 2021-12-06 DIAGNOSIS — E782 Mixed hyperlipidemia: Secondary | ICD-10-CM

## 2021-12-06 DIAGNOSIS — Z23 Encounter for immunization: Secondary | ICD-10-CM | POA: Diagnosis not present

## 2021-12-06 MED ORDER — MONTELUKAST SODIUM 10 MG PO TABS: 10.0000 mg | ORAL_TABLET | Freq: Every day | ORAL | 1 refills | Status: DC

## 2021-12-06 MED ORDER — ROSUVASTATIN CALCIUM 40 MG PO TABS: 40.0000 mg | ORAL_TABLET | Freq: Every day | ORAL | 1 refills | Status: DC

## 2021-12-06 MED ORDER — GLIPIZIDE 5 MG PO TABS: ORAL_TABLET | ORAL | 1 refills | Status: DC

## 2021-12-06 MED ORDER — JARDIANCE 25 MG PO TABS: 25.0000 mg | ORAL_TABLET | Freq: Every morning | ORAL | 1 refills | Status: DC

## 2021-12-06 NOTE — Telephone Encounter (Signed)
Requested Prescriptions  Pending Prescriptions Disp Refills  . pantoprazole (PROTONIX) 40 MG tablet [Pharmacy Med Name: PANTOPRAZOLE SODIUM 40 MG DR TAB] 90 tablet 1    Sig: TAKE 1 TABLET BY MOUTH DAILY     Gastroenterology: Proton Pump Inhibitors Passed - 12/05/2021  4:06 PM      Passed - Valid encounter within last 12 months    Recent Outpatient Visits          4 weeks ago Upper respiratory tract infection, unspecified type   [place], Megan P, DO   1 month ago Intermittent left-sided chest pain   [place], Megan P, DO   1 month ago Intermittent left-sided chest pain   [place], NP   2 months ago Left-sided chest pain   [place], Megan P, DO   3 months ago Intermittent left-sided chest pain   [place], Avanti, MD      Future Appointments            Today [name], DO [place], [place]   In 2 weeks  [facility], [facility]

## 2021-12-06 NOTE — Progress Notes (Incomplete)
BP 119/77   Pulse 76   Temp 98.5 F (36.9 C) (Oral)   Wt 214 lb (97.1 kg)   SpO2 92%   BMI 34.54 kg/m    Subjective:    Patient ID: Tammy [name], female    DOB: [birth year], 76 y.o.   MRN: [id number]  HPI: Tammy [name] is a 76 y.o. female  Chief Complaint  Patient presents with  . Chest Pain    Patient is here for a four week follow up on Rib Pain, as she recently had imaging performed and says she was told she has 2 broken ribs on her left side.    HYPERTENSION / HYPERLIPIDEMIA Satisfied with current treatment? {Blank single:19197::"yes","no"} Duration of hypertension: {Blank single:19197::"chronic","months","years"} BP monitoring frequency: {Blank single:19197::"not checking","rarely","daily","weekly","monthly","a few times a day","a few times a week","a few times a month"} BP range:  BP medication side effects: {Blank single:19197::"yes","no"} Past BP meds: {Blank WUJWJXBJ:47829::"FAOZ","HYQMVHQION","GEXBMWUXLK/GMWNUUVOZD","GUYQIHKV","QQVZDGLOVF","IEPPIRJJOA/CZYS","AYTKZSWFUX (bystolic)","carvedilol","chlorthalidone","clonidine","diltiazem","exforge HCT","HCTZ","irbesartan (avapro)","labetalol","lisinopril","lisinopril-HCTZ","losartan (cozaar)","methyldopa","nifedipine","olmesartan (benicar)","olmesartan-HCTZ","quinapril","ramipril","spironalactone","tekturna","valsartan","valsartan-HCTZ","verapamil"} Duration of hyperlipidemia: {Blank single:19197::"chronic","months","years"} Cholesterol medication side effects: {Blank single:19197::"yes","no"} Cholesterol supplements: {Blank multiple:19196::"none","fish oil","niacin","red yeast rice"} Past cholesterol medications: {Blank multiple:19196::"none","atorvastain (lipitor)","lovastatin (mevacor)","pravastatin (pravachol)","rosuvastatin (crestor)","simvastatin (zocor)","vytorin","fenofibrate (tricor)","gemfibrozil","ezetimide (zetia)","niaspan","lovaza"} Medication compliance: {Blank single:19197::"excellent compliance","good  compliance","fair compliance","poor compliance"} Aspirin: {Blank single:19197::"yes","no"} Recent stressors: {Blank single:19197::"yes","no"} Recurrent headaches: {Blank single:19197::"yes","no"} Visual changes: {Blank single:19197::"yes","no"} Palpitations: {Blank single:19197::"yes","no"} Dyspnea: {Blank single:19197::"yes","no"} Chest pain: {Blank single:19197::"yes","no"} Lower extremity edema: {Blank single:19197::"yes","no"} Dizzy/lightheaded: {Blank single:19197::"yes","no"}  DIABETES Hypoglycemic episodes:{Blank single:19197::"yes","no"} Polydipsia/polyuria: {Blank single:19197::"yes","no"} Visual disturbance: {Blank single:19197::"yes","no"} Chest pain: {Blank single:19197::"yes","no"} Paresthesias: {Blank single:19197::"yes","no"} Glucose Monitoring: {Blank single:19197::"yes","no"}  Accucheck frequency: {Blank single:19197::"Not Checking","Daily","BID","TID"}  Fasting glucose:  Post prandial:  Evening:  Before meals: Taking Insulin?: {Blank single:19197::"yes","no"}  Long acting insulin:  Short acting insulin: Blood Pressure Monitoring: {Blank single:19197::"not checking","rarely","daily","weekly","monthly","a few times a day","a few times a week","a few times a month"} Retinal Examination: {Blank single:19197::"Up to Date","Not up to Date"} Foot Exam: {Blank single:19197::"Up to Date","Not up to Date"} Diabetic Education: {Blank single:19197::"Completed","Not Completed"} Pneumovax: {Blank single:19197::"Up to Date","Not up to Date","unknown"} Influenza: {Blank single:19197::"Up to Date","Not up to Date","unknown"} Aspirin: {Blank single:19197::"yes","no"}  Relevant past medical, surgical, family and social history reviewed and updated as indicated. Interim medical history since our last visit reviewed. Allergies and medications reviewed and updated.  Review of Systems  Constitutional: Negative.   Respiratory:  Positive for chest tightness. Negative for apnea, cough,  choking, shortness of breath, wheezing and stridor.   Cardiovascular:  Positive for chest pain. Negative for palpitations and leg swelling.  Gastrointestinal: Negative.   Genitourinary: Negative.   Musculoskeletal: Negative.   Neurological: Negative.   Psychiatric/Behavioral: Negative.      Per HPI unless specifically indicated above     Objective:    BP 119/77   Pulse 76   Temp 98.5 F (36.9 C) (Oral)   Wt 214 lb (97.1 kg)   SpO2 92%   BMI 34.54 kg/m   Wt Readings from Last 3 Encounters:  12/06/21 214 lb (97.1 kg)  11/08/21 214 lb 3.2 oz (97.2 kg)  10/25/21 216 lb 12.8 oz (98.3 kg)    Physical Exam Vitals and nursing note reviewed.  Constitutional:      General: She is not in acute distress.    Appearance: Normal appearance. She is well-developed. She is obese. She is not ill-appearing, toxic-appearing or diaphoretic.  HENT:     Head: Normocephalic and atraumatic.     Right Ear: External ear normal.     Left Ear: External ear normal.     Nose: Nose normal.     Mouth/Throat:  Mouth: Mucous membranes are moist.     Pharynx: Oropharynx is clear.  Eyes:     General: No scleral icterus.       Right eye: No discharge.        Left eye: No discharge.     Extraocular Movements: Extraocular movements intact.     Conjunctiva/sclera: Conjunctivae normal.     Pupils: Pupils are equal, round, and reactive to light.  Cardiovascular:     Rate and Rhythm: Normal rate and regular rhythm.     Pulses: Normal pulses.     Heart sounds: Normal heart sounds. No murmur heard.    No friction rub. No gallop.  Pulmonary:     Effort: Pulmonary effort is normal. No respiratory distress.     Breath sounds: Normal breath sounds. No stridor. No wheezing, rhonchi or rales.  Chest:     Chest wall: No tenderness.  Musculoskeletal:        General: Normal range of motion.     Cervical back: Normal range of motion and neck supple.  Skin:    General: Skin is warm and dry.     Capillary  Refill: Capillary refill takes less than 2 seconds.     Coloration: Skin is not jaundiced or pale.     Findings: No bruising, erythema, lesion or rash.  Neurological:     General: No focal deficit present.     Mental Status: She is alert and oriented to person, place, and time. Mental status is at baseline.  Psychiatric:        Mood and Affect: Mood normal.        Behavior: Behavior normal.        Thought Content: Thought content normal.        Judgment: Judgment normal.     Results for orders placed or performed in visit on 11/08/21  Novel Coronavirus, NAA ([facility])   Specimen: Saline  Result Value Ref Range   SARS-CoV-2, NAA Not Detected Not Detected  Rapid Strep screen([facility])   Specimen: Other   Other  Result Value Ref Range   Strep Gp A Ag, IA W/Reflex Negative Negative  Culture, Group A Strep   Other  Result Value Ref Range   Strep A Culture Negative   Veritor Flu A/B Waived  Result Value Ref Range   Influenza A Negative Negative   Influenza B Negative Negative      Assessment & Plan:   Problem List Items Addressed This Visit   None    Follow up plan: No follow-ups on file.

## 2021-12-06 NOTE — Telephone Encounter (Unsigned)
Copied from [place] [id number]. Topic: General - Inquiry >> Dec 06, 2021  1:57 PM Marcellus Scott wrote: Reason for CRM: Maudry Mayhew from [employer] wants to confirm receipt of imaging results for MRI breast, chest, left shoulder, as well as other studies. Stated they were faxed today at 12:34. Maudry Mayhew mentioned that she will fax them manually as well.  [phone]  Please advise.

## 2021-12-07 LAB — BASIC METABOLIC PANEL
BUN/Creatinine Ratio: 16 (ref 12–28)
BUN: 25 mg/dL (ref 8–27)
CO2: 24 mmol/L (ref 20–29)
Calcium: 9.1 mg/dL (ref 8.7–10.3)
Chloride: 102 mmol/L (ref 96–106)
Creatinine, Ser: 1.52 mg/dL — ABNORMAL HIGH (ref 0.57–1.00)
Glucose: 79 mg/dL (ref 70–99)
Potassium: 5.1 mmol/L (ref 3.5–5.2)
Sodium: 141 mmol/L (ref 134–144)
eGFR: 35 mL/min/{1.73_m2} — ABNORMAL LOW (ref >59)

## 2021-12-07 NOTE — Telephone Encounter (Signed)
Returned call to Tammy [name] to notify we did receive imaging.

## 2021-12-07 NOTE — Assessment & Plan Note (Signed)
Doing much better with A1c of 7.5. Continue current regimen. Continue to monitor. Call with any concerns.

## 2021-12-07 NOTE — Assessment & Plan Note (Signed)
Under good control on current regimen. Continue current regimen. Continue to monitor. Call with any concerns. Refills given.   

## 2021-12-07 NOTE — Progress Notes (Signed)
BP 119/77   Pulse 76   Temp 98.5 F (36.9 C) (Oral)   Wt 214 lb (97.1 kg)   SpO2 92%   BMI 34.54 kg/m    Subjective:    Patient ID: Tammy [name], female    DOB: 13-Jan-1946, 76 y.o.   MRN: [id number]  HPI: Tammy [name] is a 76 y.o. female  Chief Complaint  Patient presents with   Chest Pain    Patient is here for a four week follow up on Rib Pain, as she recently had imaging performed and says she was told she has 2 broken ribs on her left side.    [place] in [place] for her pain. He did an breast MRI and found 2 rib fractures with hematoma that have not healed after 2+ months after injury. He got her started on some lidoderm patches and they seem to be really helping. She is feeling much better.   HYPERTENSION / HYPERLIPIDEMIA Satisfied with current treatment? yes Duration of hypertension: chronic BP monitoring frequency: rarely BP medication side effects: no Past BP meds: imdur Duration of hyperlipidemia: chronic Cholesterol medication side effects: no Cholesterol supplements: none Past cholesterol medications: crestor Medication compliance: excellent compliance Aspirin: no Recent stressors: no Recurrent headaches: no Visual changes: no Palpitations: no Dyspnea: no Chest pain: no Lower extremity edema: no Dizzy/lightheaded: no  DIABETES Hypoglycemic episodes:no Polydipsia/polyuria: no Visual disturbance: no Chest pain: yes Paresthesias: no Glucose Monitoring: no  Accucheck frequency: Not Checking Taking Insulin?: no Blood Pressure Monitoring: not checking Retinal Examination: Not up to Date Foot Exam: Up to Date Diabetic Education: Completed Pneumovax: Up to Date Influenza: Up to Date Aspirin: no  Relevant past medical, surgical, family and social history reviewed and updated as indicated. Interim medical history since our last visit reviewed. Allergies and medications reviewed and updated.  Review of Systems   Constitutional: Negative.   Respiratory:  Positive for chest tightness. Negative for apnea, cough, choking, shortness of breath, wheezing and stridor.   Cardiovascular:  Positive for chest pain. Negative for palpitations and leg swelling.  Gastrointestinal: Negative.   Genitourinary: Negative.   Musculoskeletal: Negative.   Neurological: Negative.   Psychiatric/Behavioral: Negative.      Per HPI unless specifically indicated above     Objective:    BP 119/77   Pulse 76   Temp 98.5 F (36.9 C) (Oral)   Wt 214 lb (97.1 kg)   SpO2 92%   BMI 34.54 kg/m   Wt Readings from Last 3 Encounters:  12/06/21 214 lb (97.1 kg)  11/08/21 214 lb 3.2 oz (97.2 kg)  10/25/21 216 lb 12.8 oz (98.3 kg)    Physical Exam Vitals and nursing note reviewed.  Constitutional:      General: She is not in acute distress.    Appearance: Normal appearance. She is well-developed. She is obese. She is not ill-appearing, toxic-appearing or diaphoretic.  HENT:     Head: Normocephalic and atraumatic.     Right Ear: External ear normal.     Left Ear: External ear normal.     Nose: Nose normal.     Mouth/Throat:     Mouth: Mucous membranes are moist.     Pharynx: Oropharynx is clear.  Eyes:     General: No scleral icterus.       Right eye: No discharge.        Left eye: No discharge.     Extraocular Movements: Extraocular  movements intact.     Conjunctiva/sclera: Conjunctivae normal.     Pupils: Pupils are equal, round, and reactive to light.  Cardiovascular:     Rate and Rhythm: Normal rate and regular rhythm.     Pulses: Normal pulses.     Heart sounds: Normal heart sounds. No murmur heard.    No friction rub. No gallop.  Pulmonary:     Effort: Pulmonary effort is normal. No respiratory distress.     Breath sounds: Normal breath sounds. No stridor. No wheezing, rhonchi or rales.  Chest:     Chest wall: No tenderness.  Musculoskeletal:        General: Normal range of motion.     Cervical back:  Normal range of motion and neck supple.  Skin:    General: Skin is warm and dry.     Capillary Refill: Capillary refill takes less than 2 seconds.     Coloration: Skin is not jaundiced or pale.     Findings: No bruising, erythema, lesion or rash.  Neurological:     General: No focal deficit present.     Mental Status: She is alert and oriented to person, place, and time. Mental status is at baseline.  Psychiatric:        Mood and Affect: Mood normal.        Behavior: Behavior normal.        Thought Content: Thought content normal.        Judgment: Judgment normal.     Results for orders placed or performed in visit on 16/01/09  Basic metabolic panel  Result Value Ref Range   Glucose 79 70 - 99 mg/dL   BUN 25 8 - 27 mg/dL   Creatinine, Ser 1.52 (H) 0.57 - 1.00 mg/dL   eGFR 35 (L) >59 mL/min/1.73   BUN/Creatinine Ratio 16 12 - 28   Sodium 141 134 - 144 mmol/L   Potassium 5.1 3.5 - 5.2 mmol/L   Chloride 102 96 - 106 mmol/L   CO2 24 20 - 29 mmol/L   Calcium 9.1 8.7 - 10.3 mg/dL  CBC and differential  Result Value Ref Range   Hemoglobin 14.4 12.0 - 16.0   HCT 44 36 - 46   Platelets 353 150 - 400 K/uL   WBC 11.6   VITAMIN D 25 Hydroxy (Vit-D Deficiency, Fractures)  Result Value Ref Range   Vit D, 25-Hydroxy 45   Basic metabolic panel  Result Value Ref Range   Glucose 114    BUN 24 (A) 4 - 21   CO2 32 (A) 13 - 22   Creatinine 1.7 (A) 0.5 - 1.1   Potassium 5.5 (A) 3.5 - 5.1 mEq/L   Sodium 143 137 - 147   Chloride 103 99 - 108  Comprehensive metabolic panel  Result Value Ref Range   Globulin 3.2    eGFR 32    Calcium 10.1 8.7 - 10.7   Albumin 4.0 3.5 - 5.0  Lipid panel  Result Value Ref Range   Triglycerides 203 (A) 40 - 160   Cholesterol 126 0 - 200   HDL 45 35 - 70   LDL Cholesterol 54   Hepatic function panel  Result Value Ref Range   Alkaline Phosphatase 76 25 - 125   ALT 10 7 - 35 U/L   AST 15 13 - 35   Bilirubin, Total 0.5   Hemoglobin A1c  Result Value  Ref Range   Hemoglobin A1C 7.5   TSH  Result Value Ref Range   TSH 0.63 0.41 - 5.90  POCT erythrocyte sed rate, Non-automated  Result Value Ref Range   Sed Rate 45       Assessment & Plan:   Problem List Items Addressed This Visit       Cardiovascular and Mediastinum   Essential hypertension    Under good control on current regimen. Continue current regimen. Continue to monitor. Call with any concerns. Refills given.       Relevant Medications   rosuvastatin (CRESTOR) 40 MG tablet     Endocrine   Uncontrolled type 2 diabetes mellitus with hyperglycemia, with long-term current use of insulin ([place])    Doing much better with A1c of 7.5. Continue current regimen. Continue to monitor. Call with any concerns.       Relevant Medications   rosuvastatin (CRESTOR) 40 MG tablet   JARDIANCE 25 MG TABS tablet   glipiZIDE (GLUCOTROL) 5 MG tablet   Type 2 diabetes mellitus with stage 3 chronic kidney disease ([place])    Doing much better with A1c of 7.5. Continue current regimen. Continue to monitor. Call with any concerns.       Relevant Medications   rosuvastatin (CRESTOR) 40 MG tablet   JARDIANCE 25 MG TABS tablet   glipiZIDE (GLUCOTROL) 5 MG tablet     Other   Hyperlipidemia    Under good control on current regimen. Continue current regimen. Continue to monitor. Call with any concerns. Refills given.        Relevant Medications   rosuvastatin (CRESTOR) 40 MG tablet   Hyperkalemia - Primary   Relevant Orders   Basic metabolic panel (Completed)   Other Visit Diagnoses     Closed fracture of multiple ribs of left side, initial encounter       Found on breast MRI. Will follow with doctor she was seeing. Will refer to cardiothroacic surgery. Await their input. Call with any concerns.    Relevant Orders   Ambulatory referral to Cardiothoracic Surgery   Traumatic hematoma       Found on breast MRI. Will follow with doctor she was seeing. Will refer to cardiothroacic surgery.  Await their input. Call with any concerns.    Relevant Orders   Ambulatory referral to Cardiothoracic Surgery   Needs flu shot       Relevant Orders   Flu Vaccine QUAD High Dose(Fluad) (Completed)         Follow up plan: Return in about 4 weeks (around 01/03/2022).

## 2021-12-08 ENCOUNTER — Encounter: Payer: Self-pay | Admitting: Family Medicine

## 2021-12-08 ENCOUNTER — Telehealth: Payer: Self-pay | Admitting: Family Medicine

## 2021-12-08 NOTE — Telephone Encounter (Signed)
Called patient to notify of provider message, patient states she is going out of town and will do it when she comes back.

## 2021-12-08 NOTE — Telephone Encounter (Signed)
error 

## 2021-12-08 NOTE — Telephone Encounter (Signed)
Can we please see if Ia can get a disc copy of the MRI and CT scans she had with Dr. Sharlett Iles because the surgeon needs to see them so he can make her her appointment.

## 2021-12-12 ENCOUNTER — Other Ambulatory Visit: Payer: Self-pay | Admitting: Family Medicine

## 2021-12-12 ENCOUNTER — Telehealth: Payer: Self-pay

## 2021-12-12 DIAGNOSIS — N261 Atrophy of kidney (terminal): Secondary | ICD-10-CM | POA: Diagnosis not present

## 2021-12-12 DIAGNOSIS — R8290 Unspecified abnormal findings in urine: Secondary | ICD-10-CM | POA: Diagnosis not present

## 2021-12-12 NOTE — Progress Notes (Signed)
Chronic Care Management Pharmacy Assistant   Name: Tammy [name]  MRN: [id number] DOB: December 14, 1945  Reason for Encounter: Disease State   Conditions to be addressed/monitored: DMII  Recent office visits:  12/06/21 Tammy Roys, DO (Chest pain) Orders: Labs, ambulatory referral to Cardiothoracic surgery- Medication changes: Patient reported not taking ciprofloxacin, budeson-glycopyrrol, prednisone and bisoprolol  Recent consult visits:  None since last coordination call with CCM RN on 11/23/21  Hospital visits:  None since last coordination call with CCM RN on 11/23/21  Medications: Outpatient Encounter Medications as of 12/12/2021  Medication Sig   acyclovir (ZOVIRAX) 800 MG tablet Take 800 mg by mouth 4 (four) times daily.   albuterol (PROVENTIL) (2.5 MG/3ML) 0.083% nebulizer solution Take 3 mLs (2.5 mg total) by nebulization every 6 (six) hours as needed for wheezing or shortness of breath.   albuterol (VENTOLIN HFA) 108 (90 Base) MCG/ACT inhaler Inhale 2 puffs into the lungs every 6 (six) hours as needed for wheezing or shortness of breath.   anastrozole (ARIMIDEX) 1 MG tablet TAKE 1 TABLET BY MOUTH DAILY   blood glucose meter kit and supplies KIT Dispense based on patient and insurance preference. Use one time daily as directed, E11.9   Calcium Carb-Cholecalciferol (CALCIUM 600 + D) 600-200 MG-UNIT TABS Take 1 tablet by mouth daily.    cholecalciferol (VITAMIN D3) 25 MCG (1000 UT) tablet Take 1,000 Units by mouth daily.   CRANBERRY PO Take 2 capsules by mouth daily.   cyclobenzaprine (FLEXERIL) 5 MG tablet Take 1 tablet (5 mg total) by mouth 3 (three) times daily as needed for muscle spasms.   DULoxetine (CYMBALTA) 60 MG capsule TAKE 2 CAPSULES BY MOUTH EVERY DAY   ELIQUIS 2.5 MG TABS tablet TAKE ONE TABLET BY MOUTH TWICE DAILY   fluticasone (FLONASE) 50 MCG/ACT nasal spray Place 1 spray into both nostrils daily.   gabapentin (NEURONTIN) 300 MG capsule Take 2 capsules (600 mg  total) by mouth 3 (three) times daily.   glipiZIDE (GLUCOTROL) 5 MG tablet TAKE 1 TABLET BY MOUTH DAILY BEFORE BREAKFAST   insulin degludec (TRESIBA FLEXTOUCH) 100 UNIT/ML FlexTouch Pen Inject 40 Units into the skin daily.   Insulin Pen Needle (PEN NEEDLES 3/16") 31G X 5 MM MISC 1 each by Does not apply route daily.   isosorbide mononitrate (IMDUR) 60 MG 24 hr tablet TAKE ONE TABLET BY MOUTH AT BEDTIME   JARDIANCE 25 MG TABS tablet Take 1 tablet (25 mg total) by mouth every morning.   Lactobacillus (PROBIOTIC ACIDOPHILUS PO) Take 1 capsule by mouth daily at 12 noon.   lidocaine (LIDODERM) 5 % Place 1 patch onto the skin daily. Remove & Discard patch within 12 hours or as directed by MD   montelukast (SINGULAIR) 10 MG tablet Take 1 tablet (10 mg total) by mouth at bedtime.   MYRBETRIQ 50 MG TB24 tablet TAKE 1 TABLET BY MOUTH DAILY   nitrofurantoin, macrocrystal-monohydrate, (MACROBID) 100 MG capsule Take 100 mg by mouth daily.   nitroGLYCERIN (NITROSTAT) 0.4 MG SL tablet Place 1 tablet (0.4 mg total) under the tongue every 5 (five) minutes as needed for chest pain. Total of 3 doses   pantoprazole (PROTONIX) 40 MG tablet TAKE 1 TABLET BY MOUTH DAILY   Pumpkin Seed-Soy Germ (AZO BLADDER CONTROL/GO-LESS PO) Take 1 tablet by mouth in the morning and at bedtime.   rosuvastatin (CRESTOR) 40 MG tablet Take 1 tablet (40 mg total) by mouth daily.   senna (SENOKOT) 8.6 MG TABS tablet Take  8.6 mg by mouth daily as needed for mild constipation.   SYNTHROID 150 MCG tablet TAKE ONE TABLET ON AN EMPTY STOMACH WITHA GLASS OF WATER AT LEAST 30 TO 60 MINUTES BEFORE BREAKFAST   trimethoprim (TRIMPEX) 100 MG tablet TAKE ONE TABLET BY MOUTH EVERY DAY   TRULICITY 4.5 PZ/0.2HE SOPN SMARTSIG:4.5 Milligram(s) SUB-Q Once a Week   UNABLE TO FIND Med Name: Energy, memory and mood enhancer   ZINC CITRATE PO Take 1 tablet by mouth daily.   No facility-administered encounter medications on file as of 12/12/2021.   Recent  Relevant Labs: Lab Results  Component Value Date/Time   HGBA1C 7.5 11/23/2021 12:00 AM   HGBA1C 8.5 (H) 09/08/2021 09:22 AM   HGBA1C 9.6 (H) 06/08/2021 07:11 PM   HGBA1C 8.0 (H) 02/15/2021 10:15 AM   HGBA1C 8.7 01/31/2016 12:00 AM   MICROALBUR 150 (H) 08/19/2020 10:11 AM   MICROALBUR 150 (H) 01/02/2019 10:30 AM    Kidney Function Lab Results  Component Value Date/Time   CREATININE 1.52 (H) 12/06/2021 03:44 PM   CREATININE 1.7 (A) 11/23/2021 12:00 AM   CREATININE 1.91 (H) 09/26/2021 09:47 AM   CREATININE 1.31 (H) 09/20/2011 11:34 AM   GFRNONAA 27 (L) 09/26/2021 09:47 AM   GFRNONAA 42 (L) 09/20/2011 11:34 AM   GFRAA 32 (L) 02/10/2020 02:40 PM   GFRAA 48 (L) 09/20/2011 11:34 AM    Current antihyperglycemic regimen:  Tresiba 100 units inject 40 units into the skin daily Jardiance 25 mg take 1 tab daily Trulicity 4.5 mg   What recent interventions/DTPs have been made to improve glycemic control:  None noted  Have there been any recent hospitalizations or ED visits since last visit with CPP? No  Patient denies hypoglycemic symptoms, including None  Patient denies hyperglycemic symptoms, including none  How often are you checking your blood sugar? once daily before she eats.  What are your blood sugars ranging?80-114  Fasting: 10/4- 94, 10/3-86, 10/2-84, 10/1-92, 9/30-103  During the week, how often does your blood glucose drop below 70?  Patient stated she had one reading that was 98 which was a couple of weeks ago. She was not sure why but does not remember any symptoms.  Are you checking your feet daily/regularly? Patient stated that she does not have any swelling, redness or sores that are not healing.  Adherence Review: Is the patient currently on a STATIN medication? Yes Is the patient currently on ACE/ARB medication? No Does the patient have >5 day gap between last estimated fill dates? No  Care Gaps: Colonoscopy-01/28/19 Diabetic Foot  Exam-08/19/20 Mammogram-07/27/21 Ophthalmology-11/09/20 Dexa Scan - NA Annual Well Visit - 12/20/20 (Medicare) Micro albumin-NA Hemoglobin A1c- 11/23/21  Star Rating Drugs: Rosuvastatin 40 mg-last fill 09/15/21 90 ds, 06/17/21 90 ds  [facility] [phone]

## 2021-12-13 NOTE — Telephone Encounter (Signed)
Requested medication (s) are due for refill today:   Provider to review  Requested medication (s) are on the active medication list:   No Not on med list  Future visit scheduled:   Yes   Last ordered: Not on med list.  In [facility] note it was prescribed 11/08/2021 #90, 0 refills  Returned because it's a non delegated refill   Requested Prescriptions  Pending Prescriptions Disp Refills   oxyCODONE-acetaminophen (PERCOCET/ROXICET) 5-325 MG tablet [Pharmacy Med Name: OXYCODONE-APAP 5-325 MG TAB] 90 tablet     Sig: TAKE 1/2 TO 1 TABLET BY MOUTH EVERY 8 HOURS AS NEEDED FOR SEVERE PAIN     Not Delegated - Analgesics:  Opioid Agonist Combinations Failed - 12/12/2021 11:06 AM      Failed - This refill cannot be delegated      Failed - Urine Drug Screen completed in last 360 days      Passed - Valid encounter within last 3 months    Recent Outpatient Visits           1 week ago Hyperkalemia   [place], Megan P, DO   1 month ago Upper respiratory tract infection, unspecified type   [place], Megan P, DO   1 month ago Intermittent left-sided chest pain   [place], Megan P, DO   2 months ago Intermittent left-sided chest pain   [place], NP   3 months ago Left-sided chest pain   [place], [place], DO       Future Appointments             In 1 week  [facility], [place]   In 3 weeks Wynetta Emery, Barb Merino, DO [facility], [facility]

## 2021-12-14 ENCOUNTER — Other Ambulatory Visit: Payer: Self-pay | Admitting: Family Medicine

## 2021-12-14 ENCOUNTER — Other Ambulatory Visit: Payer: Self-pay | Admitting: Internal Medicine

## 2021-12-14 NOTE — Telephone Encounter (Signed)
Requested medication (s) are due for refill today - no  Requested medication (s) are on the active medication list -no  Future visit scheduled -yes  Last refill: 02/15/21  Notes to clinic: Medication no longer on current medication list- sent for review of request  Requested Prescriptions  Pending Prescriptions Disp Refills   metoprolol succinate (TOPROL-XL) 50 MG 24 hr tablet [Pharmacy Med Name: METOPROLOL SUCCINATE ER 50 MG TAB] 90 tablet 1    Sig: TAKE ONE TABLET BY MOUTH EVERY DAY WITH OR IMMEDIATELY FOLLOWING A MEAL     Cardiovascular:  Beta Blockers Passed - 12/14/2021  8:55 AM      Passed - Last BP in normal range    BP Readings from Last 1 Encounters:  12/06/21 119/77         Passed - Last Heart Rate in normal range    Pulse Readings from Last 1 Encounters:  12/06/21 76         Passed - Valid encounter within last 6 months    Recent Outpatient Visits           1 week ago Hyperkalemia   [facility] [place], Megan P, DO   1 month ago Upper respiratory tract infection, unspecified type   [place], Megan P, DO   1 month ago Intermittent left-sided chest pain   [facility] [place], Megan P, DO   2 months ago Intermittent left-sided chest pain   [facility] [place], NP   3 months ago Left-sided chest pain   [facility] [place], [place], DO       Future Appointments             In 1 week  [facility]   In 2 weeks [place], Megan P, DO [place], [facility]               Requested Prescriptions  Pending Prescriptions Disp Refills   metoprolol succinate (TOPROL-XL) 50 MG 24 hr tablet [Pharmacy Med Name: METOPROLOL SUCCINATE ER 50 MG TAB] 90 tablet 1    Sig: TAKE ONE TABLET BY MOUTH EVERY DAY WITH OR IMMEDIATELY FOLLOWING A MEAL     Cardiovascular:  Beta Blockers Passed - 12/14/2021  8:55 AM      Passed - Last BP in normal range    BP Readings  from Last 1 Encounters:  12/06/21 119/77         Passed - Last Heart Rate in normal range    Pulse Readings from Last 1 Encounters:  12/06/21 76         Passed - Valid encounter within last 6 months    Recent Outpatient Visits           1 week ago Hyperkalemia   [place], Megan P, DO   1 month ago Upper respiratory tract infection, unspecified type   [place], Megan P, DO   1 month ago Intermittent left-sided chest pain   [facility] [place], Megan P, DO   2 months ago Intermittent left-sided chest pain   [facility], NP   3 months ago Left-sided chest pain   [facility] [place], [place], DO       Future Appointments             In 1 week  [facility], [place]   In 2 weeks [place], Megan P, DO [facility], [facility]

## 2021-12-19 ENCOUNTER — Encounter: Payer: Self-pay | Admitting: *Deleted

## 2021-12-20 ENCOUNTER — Ambulatory Visit
Admission: RE | Admit: 2021-12-20 | Discharge: 2021-12-20 | Disposition: A | Discharge status: Home / Self Care | Payer: Medicare Other | Source: Ambulatory Visit | Attending: Radiation Oncology | Admitting: Radiation Oncology

## 2021-12-20 DIAGNOSIS — J439 Emphysema, unspecified: Secondary | ICD-10-CM | POA: Diagnosis not present

## 2021-12-20 DIAGNOSIS — C3412 Malignant neoplasm of upper lobe, left bronchus or lung: Secondary | ICD-10-CM | POA: Insufficient documentation

## 2021-12-20 DIAGNOSIS — C3490 Malignant neoplasm of unspecified part of unspecified bronchus or lung: Secondary | ICD-10-CM | POA: Diagnosis not present

## 2021-12-21 DIAGNOSIS — H2511 Age-related nuclear cataract, right eye: Secondary | ICD-10-CM | POA: Diagnosis not present

## 2021-12-21 HISTORY — PX: CATARACT EXTRACTION: SUR2

## 2021-12-22 ENCOUNTER — Ambulatory Visit: Payer: Medicare Other

## 2021-12-22 DIAGNOSIS — H2512 Age-related nuclear cataract, left eye: Secondary | ICD-10-CM | POA: Diagnosis not present

## 2021-12-23 ENCOUNTER — Ambulatory Visit: Payer: Medicare Other

## 2021-12-26 ENCOUNTER — Ambulatory Visit (INDEPENDENT_AMBULATORY_CARE_PROVIDER_SITE_OTHER): Payer: Medicare Other | Admitting: *Deleted

## 2021-12-26 DIAGNOSIS — Z0000 Encounter for general adult medical examination without abnormal findings: Secondary | ICD-10-CM | POA: Diagnosis not present

## 2021-12-26 NOTE — Patient Instructions (Signed)
Tammy [name] , Thank you for taking time to come for your Medicare Wellness Visit. I appreciate your ongoing commitment to your health goals. Please review the following plan we discussed and let me know if I can assist you in the future.   These are the goals we discussed:  Goals       "I want to keep feeling good" (pt-stated)      CARE PLAN ENTRY (see longtitudinal plan of care for additional care plan information)  Current Barriers:  Diabetes: uncontrolled; complicated by chronic medical conditions including breast cancer, most recent A1c 7.7% Most recent eGFR: ~357m/min She is s/p total knee arthroscopy. Receiving PT twice weekly, only taking tylenol prn pain Current antihyperglycemic regimen: Trulicity 3 mg weekly, glipizide 5 mg QAM, Jardiance 25 mg daily, Tresiba 24 units daily Denies hypoglycemic symptoms, including dizziness, lightheadedness, shaking, sweating Current glucose readings:  Fastings in past few days 100-110s, excursions never above 120  Cardiovascular risk reduction: Current hypertensive regimen: metoprolol succinate 50 mg daily, losartan 25 mg daily - had been decreased by cardiology in May to 12.5 mg daily d/t presenting w/ hypotension to the appointment, but patient notes that she generally avoids drinking water before appointments to avoid urinary accidents. She self-increased back to losartan 25 mg daily and documented clinic readings remain at goal <130/90 and no SBP <100. Not checking at home. Current hyperlipidemia regimen: rosuvastatin 40 mg daily  Current antiplatelet regimen: Eliquis 2.5 mg BID (DVT ppx reduced dose) Stage I breast cancer ER/PR positive HER-2 negative; follows w/ cancer center. On Arimadex 1 mg daily  Overactive bladder: Myrbetriq 50 mg daily - trimethoprim 100 mg daily - restarted Uribel 1 cap qhs. Aware of potential interaction with duloxetine (serotonin syndrome) has tolerated previously. Denies increased HR, BP, sweting, diarrhea or confusion.  Reports she no longer having urinary symptoms and doesn't need to get up overnight to urinate. Pharmacist Clinical Goal(s):  Over the next 90 days, patient will work with PharmD and primary care provider to address optimized medication management  Interventions: Comprehensive medication review performed, medication list updated in electronic medical record Inter-disciplinary care team collaboration (see longitudinal plan of care) Extensively reviewed diet with patient. Encouraged her incorporate protein with each meal, reduce servings and portion size of fruit and eat dinner earlier as schedule allows. Collaborate with RN CM to provide dietary, lifestyle support.    Patient Self Care Activities:  Patient will check blood glucose every morning and occasionally 2 hours after a meal.Record readings and bring to appointments. Patient will take medications as prescribed Patient will report any questions or concerns to provider  Patient will incorporate protein into meals. Limit daily servings of fruit.  Please see past updates related to this goal by clicking on the "Past Updates" button in the selected goal        DIET - INCREASE WATER INTAKE      Recommend drinking at least 6-8 glasses of water a day       Obtain Eye Exam-Diabetes Type 2      Follow Up Date 2 month follow up    - schedule appointment with eye doctor    Why is this important?   Eye check-ups are important when you have diabetes.  Vision loss can be prevented.    Notes:       Patient Stated      12/19/2019, wants to walk without assist device      Patient Stated      12/20/2020, wants  to get over lung cancer      RNCM: Effective Management of pain      Care Coordination Interventions: Reviewed provider established plan for pain management. The patient has been dealing with acute pain in her left arm breast area for over 6 weeks. She has had an MRI of her spine and also been to several providers. She is going to  [facility] to have an MRI of her arm and breast area on the left side from a provider she met recently that listened to her concerns. The patient states that she just wants answers to why she is having so much pain and discomfort in that area. She says she can hardly stand the pain Discussed importance of adherence to all scheduled medical appointments. Going to the eye doctor today to see about her surgery for removal of her cataracts in November. She has follow up with pcp on 12-06-2021 Counseled on the importance of reporting any/all new or changed pain symptoms or management strategies to pain management provider Advised patient to report to care team affect of pain on daily activities Discussed use of relaxation techniques and/or diversional activities to assist with pain reduction (distraction, imagery, relaxation, massage, acupressure, TENS, heat, and cold application Reviewed with patient prescribed pharmacological and nonpharmacological pain relief strategies Advised patient to discuss unresolved pain, changes in level and intensity of pain, and recommendations for finding direct reason for the pain and discomfort with provider Screening for signs and symptoms of depression related to chronic disease state  Assessed social determinant of health barriers  Active listening / Reflection utilized  Emotional Support Provided         This is a list of the screening recommended for you and due dates:  Health Maintenance  Topic Date Due   Zoster (Shingles) Vaccine (1 of 2) Never done   Yearly kidney health urinalysis for diabetes  08/19/2021   Eye exam for diabetics  11/09/2021   COVID-19 Vaccine (3 - Pfizer risk series) 01/11/2022*   Tetanus Vaccine  06/18/2022*   Colon Cancer Screening  12/27/2022*   Hemoglobin A1C  05/24/2022   Complete foot exam   09/09/2022   Yearly kidney function blood test for diabetes  12/07/2022   Mammogram  07/28/2023   Pneumonia Vaccine  Completed   Flu Shot   Completed   DEXA scan (bone density measurement)  Completed   Hepatitis C Screening: USPSTF Recommendation to screen - Ages 57-79 yo.  Completed   HPV Vaccine  Aged Out  *Topic was postponed. The date shown is not the original due date.    Advanced directives: Education provided  Conditions/risks identified:     Preventive Care 21 Years and Older, Female Preventive care refers to lifestyle choices and visits with your health care provider that can promote health and wellness. What does preventive care include? A yearly physical exam. This is also called an annual well check. Dental exams once or twice a year. Routine eye exams. Ask your health care provider how often you should have your eyes checked. Personal lifestyle choices, including: Daily care of your teeth and gums. Regular physical activity. Eating a healthy diet. Avoiding tobacco and drug use. Limiting alcohol use. Practicing safe sex. Taking low-dose aspirin every day. Taking vitamin and mineral supplements as recommended by your health care provider. What happens during an annual well check? The services and screenings done by your health care provider during your annual well check will depend on your age, overall health, lifestyle risk  factors, and family history of disease. Counseling  Your health care provider may ask you questions about your: Alcohol use. Tobacco use. Drug use. Emotional well-being. Home and relationship well-being. Sexual activity. Eating habits. History of falls. Memory and ability to understand (cognition). Work and work Statistician. Reproductive health. Screening  You may have the following tests or measurements: Height, weight, and BMI. Blood pressure. Lipid and cholesterol levels. These may be checked every 5 years, or more frequently if you are over 10 years old. Skin check. Lung cancer screening. You may have this screening every year starting at age 80 if you have a  30-pack-year history of smoking and currently smoke or have quit within the past 15 years. Fecal occult blood test (FOBT) of the stool. You may have this test every year starting at age 75. Flexible sigmoidoscopy or colonoscopy. You may have a sigmoidoscopy every 5 years or a colonoscopy every 10 years starting at age 57. Hepatitis C blood test. Hepatitis B blood test. Sexually transmitted disease (STD) testing. Diabetes screening. This is done by checking your blood sugar (glucose) after you have not eaten for a while (fasting). You may have this done every 1-3 years. Bone density scan. This is done to screen for osteoporosis. You may have this done starting at age 19. Mammogram. This may be done every 1-2 years. Talk to your health care provider about how often you should have regular mammograms. Talk with your health care provider about your test results, treatment options, and if necessary, the need for more tests. Vaccines  Your health care provider may recommend certain vaccines, such as: Influenza vaccine. This is recommended every year. Tetanus, diphtheria, and acellular pertussis (Tdap, Td) vaccine. You may need a Td booster every 10 years. Zoster vaccine. You may need this after age 55. Pneumococcal 13-valent conjugate (PCV13) vaccine. One dose is recommended after age 16. Pneumococcal polysaccharide (PPSV23) vaccine. One dose is recommended after age 58. Talk to your health care provider about which screenings and vaccines you need and how often you need them. This information is not intended to replace advice given to you by your health care provider. Make sure you discuss any questions you have with your health care provider. Document Released: 03/26/2015 Document Revised: 11/17/2015 Document Reviewed: 12/29/2014 Elsevier Interactive Patient Education  2017 [place] Prevention in the Home Falls can cause injuries. They can happen to people of all ages. There are many  things you can do to make your home safe and to help prevent falls. What can I do on the outside of my home? Regularly fix the edges of walkways and driveways and fix any cracks. Remove anything that might make you trip as you walk through a door, such as a raised step or threshold. Trim any bushes or trees on the path to your home. Use bright outdoor lighting. Clear any walking paths of anything that might make someone trip, such as rocks or tools. Regularly check to see if handrails are loose or broken. Make sure that both sides of any steps have handrails. Any raised decks and porches should have guardrails on the edges. Have any leaves, snow, or ice cleared regularly. Use sand or salt on walking paths during winter. Clean up any spills in your garage right away. This includes oil or grease spills. What can I do in the bathroom? Use night lights. Install grab bars by the toilet and in the tub and shower. Do not use towel bars as grab bars.  Use non-skid mats or decals in the tub or shower. If you need to sit down in the shower, use a plastic, non-slip stool. Keep the floor dry. Clean up any water that spills on the floor as soon as it happens. Remove soap buildup in the tub or shower regularly. Attach bath mats securely with double-sided non-slip rug tape. Do not have throw rugs and other things on the floor that can make you trip. What can I do in the bedroom? Use night lights. Make sure that you have a light by your bed that is easy to reach. Do not use any sheets or blankets that are too big for your bed. They should not hang down onto the floor. Have a firm chair that has side arms. You can use this for support while you get dressed. Do not have throw rugs and other things on the floor that can make you trip. What can I do in the kitchen? Clean up any spills right away. Avoid walking on wet floors. Keep items that you use a lot in easy-to-reach places. If you need to reach  something above you, use a strong step stool that has a grab bar. Keep electrical cords out of the way. Do not use floor polish or wax that makes floors slippery. If you must use wax, use non-skid floor wax. Do not have throw rugs and other things on the floor that can make you trip. What can I do with my stairs? Do not leave any items on the stairs. Make sure that there are handrails on both sides of the stairs and use them. Fix handrails that are broken or loose. Make sure that handrails are as long as the stairways. Check any carpeting to make sure that it is firmly attached to the stairs. Fix any carpet that is loose or worn. Avoid having throw rugs at the top or bottom of the stairs. If you do have throw rugs, attach them to the floor with carpet tape. Make sure that you have a light switch at the top of the stairs and the bottom of the stairs. If you do not have them, ask someone to add them for you. What else can I do to help prevent falls? Wear shoes that: Do not have high heels. Have rubber bottoms. Are comfortable and fit you well. Are closed at the toe. Do not wear sandals. If you use a stepladder: Make sure that it is fully opened. Do not climb a closed stepladder. Make sure that both sides of the stepladder are locked into place. Ask someone to hold it for you, if possible. Clearly mark and make sure that you can see: Any grab bars or handrails. First and last steps. Where the edge of each step is. Use tools that help you move around (mobility aids) if they are needed. These include: Canes. Walkers. Scooters. Crutches. Turn on the lights when you go into a dark area. Replace any light bulbs as soon as they burn out. Set up your furniture so you have a clear path. Avoid moving your furniture around. If any of your floors are uneven, fix them. If there are any pets around you, be aware of where they are. Review your medicines with your doctor. Some medicines can make you  feel dizzy. This can increase your chance of falling. Ask your doctor what other things that you can do to help prevent falls. This information is not intended to replace advice given to you by your health  care provider. Make sure you discuss any questions you have with your health care provider. Document Released: 12/24/2008 Document Revised: 08/05/2015 Document Reviewed: 04/03/2014 Elsevier Interactive Patient Education  2017 [employer].

## 2021-12-26 NOTE — Progress Notes (Signed)
Subjective:   Tammy [name] is a 76 y.o. female who presents for Medicare Annual (Subsequent) preventive examination.  I connected with  Tammy [name] on 12/26/21 by a telephone enabled telemedicine application and verified that I am speaking with the correct person using two identifiers.   I discussed the limitations of evaluation and management by telemedicine. The patient expressed understanding and agreed to proceed.  Patient location: home  Provider location: Tele-health- home    Review of Systems     Cardiac Risk Factors include: advanced age (>49mn, >>33women);hypertension;family history of premature cardiovascular disease;diabetes mellitus;obesity (BMI >30kg/m2);sedentary lifestyle     Objective:    Today's Vitals   12/26/21 1220  PainSc: 10-Worst pain ever   There is no height or weight on file to calculate BMI.     12/26/2021   12:26 PM 09/26/2021    9:35 AM 06/27/2021    1:48 PM 06/09/2021    8:09 AM 06/08/2021    4:27 PM 01/17/2021    2:41 PM 12/22/2020    2:12 PM  Advanced Directives  Does Patient Have a Medical Advance Directive? No No No  No No No  Would patient like information on creating a medical advance directive? No - Patient declined Yes (MAU/Ambulatory/Procedural Areas - Information given) No - Patient declined No - Patient declined  No - Patient declined No - Patient declined    Current Medications (verified) Outpatient Encounter Medications as of 12/26/2021  Medication Sig   acyclovir (ZOVIRAX) 800 MG tablet Take 800 mg by mouth 4 (four) times daily.   albuterol (PROVENTIL) (2.5 MG/3ML) 0.083% nebulizer solution Take 3 mLs (2.5 mg total) by nebulization every 6 (six) hours as needed for wheezing or shortness of breath.   albuterol (VENTOLIN HFA) 108 (90 Base) MCG/ACT inhaler Inhale 2 puffs into the lungs every 6 (six) hours as needed for wheezing or shortness of breath.   anastrozole (ARIMIDEX) 1 MG tablet TAKE 1 TABLET BY MOUTH DAILY   blood  glucose meter kit and supplies KIT Dispense based on patient and insurance preference. Use one time daily as directed, E11.9   Calcium Carb-Cholecalciferol (CALCIUM 600 + D) 600-200 MG-UNIT TABS Take 1 tablet by mouth daily.    cholecalciferol (VITAMIN D3) 25 MCG (1000 UT) tablet Take 1,000 Units by mouth daily.   CRANBERRY PO Take 2 capsules by mouth daily.   cyclobenzaprine (FLEXERIL) 5 MG tablet Take 1 tablet (5 mg total) by mouth 3 (three) times daily as needed for muscle spasms.   DULoxetine (CYMBALTA) 60 MG capsule TAKE 2 CAPSULES BY MOUTH EVERY DAY   ELIQUIS 2.5 MG TABS tablet TAKE ONE TABLET BY MOUTH TWICE DAILY   fluticasone (FLONASE) 50 MCG/ACT nasal spray Place 1 spray into both nostrils daily.   gabapentin (NEURONTIN) 300 MG capsule Take 2 capsules (600 mg total) by mouth 3 (three) times daily.   glipiZIDE (GLUCOTROL) 5 MG tablet TAKE 1 TABLET BY MOUTH DAILY BEFORE BREAKFAST   insulin degludec (TRESIBA FLEXTOUCH) 100 UNIT/ML FlexTouch Pen Inject 40 Units into the skin daily.   Insulin Pen Needle (PEN NEEDLES 3/16") 31G X 5 MM MISC 1 each by Does not apply route daily.   isosorbide mononitrate (IMDUR) 60 MG 24 hr tablet TAKE ONE TABLET BY MOUTH AT BEDTIME   JARDIANCE 25 MG TABS tablet Take 1 tablet (25 mg total) by mouth every morning.   Lactobacillus (PROBIOTIC ACIDOPHILUS PO) Take 1 capsule by mouth daily at 12 noon.   lidocaine (LIDODERM)  5 % Place 1 patch onto the skin daily. Remove & Discard patch within 12 hours or as directed by MD   montelukast (SINGULAIR) 10 MG tablet Take 1 tablet (10 mg total) by mouth at bedtime.   MYRBETRIQ 50 MG TB24 tablet TAKE 1 TABLET BY MOUTH DAILY   nitrofurantoin, macrocrystal-monohydrate, (MACROBID) 100 MG capsule Take 100 mg by mouth daily.   nitroGLYCERIN (NITROSTAT) 0.4 MG SL tablet Place 1 tablet (0.4 mg total) under the tongue every 5 (five) minutes as needed for chest pain. Total of 3 doses   oxyCODONE-acetaminophen (PERCOCET/ROXICET) 5-325  MG tablet TAKE 1/2 TO 1 TABLET BY MOUTH EVERY 8 HOURS AS NEEDED FOR SEVERE PAIN   pantoprazole (PROTONIX) 40 MG tablet TAKE 1 TABLET BY MOUTH DAILY   Pumpkin Seed-Soy Germ (AZO BLADDER CONTROL/GO-LESS PO) Take 1 tablet by mouth in the morning and at bedtime.   rosuvastatin (CRESTOR) 40 MG tablet Take 1 tablet (40 mg total) by mouth daily.   senna (SENOKOT) 8.6 MG TABS tablet Take 8.6 mg by mouth daily as needed for mild constipation.   SYNTHROID 150 MCG tablet TAKE ONE TABLET ON AN EMPTY STOMACH WITHA GLASS OF WATER AT LEAST 30 TO 60 MINUTES BEFORE BREAKFAST   trimethoprim (TRIMPEX) 100 MG tablet TAKE ONE TABLET BY MOUTH EVERY DAY   TRULICITY 4.5 XB/2.8UX SOPN SMARTSIG:4.5 Milligram(s) SUB-Q Once a Week   UNABLE TO FIND Med Name: Energy, memory and mood enhancer   ZINC CITRATE PO Take 1 tablet by mouth daily.   No facility-administered encounter medications on file as of 12/26/2021.    Allergies (verified) Hydrocodone, Aleve [naproxen sodium], Contrast media [iodinated contrast media], Ioxaglate, Oxycodone, Ranexa [ranolazine], Sulfa antibiotics, and Tramadol   History: Past Medical History:  Diagnosis Date   Asthma    Benign neoplasm of colon    Breast cancer ([place]) 08/2018   left breast   Cervical disc disease    "bulging" - no limitations per pt   Chronic diastolic CHF (congestive heart failure) ([place])    a. 07/2012 Echo: Nl EF. mild inferoseptal HK, Gr 2 DD, mild conc LVH, mild PR/TR, mild PAH; b. 08/2019 Echo: EF 55-60%, no rwma, Gr1 DD. Nl RV size/fxn.   CKD (chronic kidney disease), stage III (HCC)    COPD (chronic obstructive pulmonary disease) (HCC)    Coronary artery disease    a. 11/2011 Cath: LAD 13md, LCX min irregs, RCA 70p, 1050mith L->R collats, EF 60%-->Med Rx; b. 03/2019 Cath: LM nl, LAD 40-5075mCX large, mild diff dzs. OM1/2 nl. RCA known to be 100m91m->R collats from LAD & LCX/OM.   Diabetes mellitus without complication (HCC)    Fusion of lumbar spine 12/22/2015    GERD (gastroesophageal reflux disease)    History of DVT (deep vein thrombosis)    History of tobacco abuse    a. Quit 2011.   Hyperlipidemia    Hypertension    Hypothyroidism    Lung cancer (HCC)[place] Obesity    Palpitations    Personal history of radiation therapy    PONV (postoperative nausea and vomiting)    Pulmonary embolus (HCC)[place]/15/2015   RLL. Presented with SOB and elevated d-dimer.    PVD (peripheral vascular disease) (HCC)[place] a. PTA of right leg and left femoral artery with stenosis; b. 09/2020 ABI: R 0.93, L 0.88 - unchanged from prior study.   [facility]   TIAs - no deficits   Past Surgical History:  Procedure Laterality Date   ANGIOPLASTY / STENTING FEMORAL     PVD; angioplasty right leg and left femoral artery with stenosis.    APPENDECTOMY  12/2014   BACK SURGERY  03/2012   nerve stimulator inserted   BACK SURGERY     screws, rods replaced with new hardware.   BACK SURGERY  11/2016   BREAST LUMPECTOMY Left 08/26/2018   BREAST SURGERY     CARDIAC CATHETERIZATION  12/07/2011   Mid LAD 50%, distal LAD 50%, mid RCA 70%, distal RCA 100%    CARDIAC CATHETERIZATION  01/04/2011   100% occluded mid RCA with good collaterals from distal LAD, normal LVEF.   CHOLECYSTECTOMY     COLONOSCOPY  2013   COLONOSCOPY WITH PROPOFOL N/A 05/24/2015   Procedure: COLONOSCOPY WITH PROPOFOL;  Surgeon: [name], MD;  Location: [place] [place];  Service: Endoscopy;  Laterality: N/A;  Diabetic - oral meds   COLONOSCOPY WITH PROPOFOL N/A 01/28/2019   Procedure: COLONOSCOPY WITH PROPOFOL;  Surgeon: [name], MD;  Location: [facility];  Service: Endoscopy;  Laterality: N/A;   DIAGNOSTIC LAPAROSCOPY     ESOPHAGOGASTRODUODENOSCOPY (EGD) WITH PROPOFOL N/A 01/28/2019   Procedure: ESOPHAGOGASTRODUODENOSCOPY (EGD) WITH PROPOFOL;  Surgeon: [name], MD;  Location: [facility];  Service: Endoscopy;  Laterality: N/A;   LAPAROSCOPIC APPENDECTOMY N/A  01/06/2015   Procedure: APPENDECTOMY LAPAROSCOPIC;  Surgeon: [name], MD;  Location: [facility];  Service: General;  Laterality: N/A;   LEFT HEART CATH AND CORONARY ANGIOGRAPHY Left 04/04/2019   Procedure: LEFT HEART CATH AND CORONARY ANGIOGRAPHY;  Surgeon: [name], MD;  Location: [facility];  Service: Cardiovascular;  Laterality: Left;   MASTECTOMY W/ SENTINEL NODE BIOPSY Left 08/26/2018   Procedure: PARTIAL MASTECTOMY WIDE EXCISION WITH SENTINEL LYMPH NODE BIOPSY LEFT;  Surgeon: [name], MD;  Location: [facility];  Service: General;  Laterality: Left;   POLYPECTOMY  05/24/2015   Procedure: POLYPECTOMY;  Surgeon: [name], MD;  Location: [place];  Service: Endoscopy;;   REPLACEMENT TOTAL KNEE Right    RIGHT/LEFT HEART CATH AND CORONARY ANGIOGRAPHY Bilateral 09/26/2021   Procedure: RIGHT/LEFT HEART CATH AND CORONARY ANGIOGRAPHY;  Surgeon: Wellington Hampshire, MD;  Location: [facility];  Service: Cardiovascular;  Laterality: Bilateral;   THYROIDECTOMY     TOTAL KNEE ARTHROPLASTY Right 11/13/2019   TRIGGER FINGER RELEASE     Family History  Problem Relation Age of Onset   Cancer Mother        colon   Hypertension Father    Heart disease Father    Heart attack Father    Diabetes Father    Cancer Sister        lung   COPD Sister    COPD Sister    Heart attack Brother    Diabetes Brother    Hypertension Brother    Heart disease Brother    Heart attack Brother    Diabetes Brother    Hypertension Brother    Heart disease Brother    Heart attack Brother    Diabetes Brother    Hypertension Brother    Heart disease Brother    Colon cancer Brother 76   Lung cancer Brother    Breast cancer Neg Hx    Social History   Socioeconomic History   Marital status: Married    Spouse name: Not on file   Number of children: 2   Years of education: some college, [employer] for interior  design    Highest education level:  Associate degree: occupational, Hotel manager, or vocational program  Occupational History   Occupation: Retired  Tobacco Use   Smoking status: Former    Packs/day: 0.50    Years: 15.00    Total pack years: 7.50    Types: Cigarettes    Quit date: 03/14/2007    Years since quitting: 14.7   Smokeless tobacco: Never  Vaping Use   Vaping Use: Never used  Substance and Sexual Activity   Alcohol use: No    Alcohol/week: 0.0 standard drinks of alcohol   Drug use: Never   Sexual activity: Not Currently    Birth control/protection: Post-menopausal  Other Topics Concern   Not on file  Social History Narrative   Lives at home with husband in [place]. Quit smoking 10 years ago; never alcohol. 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    Social Determinants of Health   Financial Resource Strain: Low Risk  (12/26/2021)   Overall Financial Resource Strain (CARDIA)    Difficulty of Paying Living Expenses: Not hard at all  Food Insecurity: No Food Insecurity (12/26/2021)   Hunger Vital Sign    Worried About Running Out of Food in the Last Year: Never true    Ran Out of Food in the Last Year: Never true  Transportation Needs: No Transportation Needs (12/26/2021)   PRAPARE - Hydrologist (Medical): No    Lack of Transportation (Non-Medical): No  Physical Activity: Inactive (12/26/2021)   Exercise Vital Sign    Days of Exercise per Week: 0 days    Minutes of Exercise per Session: 0 min  Stress: No Stress Concern Present (12/26/2021)   [place]    Feeling of Stress : Not at all  Social Connections: [place] (12/28/2020)   Social Connection and Isolation Panel [NHANES]    Frequency of Communication with Friends and Family: More than three times a week    Frequency of Social Gatherings with Friends and Family: More than three times a week    Attends  Religious Services: More than 4 times per year    Active Member of [employer] or Organizations: Yes    Attends Archivist Meetings: 1 to 4 times per year    Marital Status: Married    Tobacco Counseling Counseling given: Not Answered   Clinical Intake:  Pre-visit preparation completed: Yes  Pain : 0-10 Pain Score: 10-Worst pain ever Pain Type: Chronic pain Pain Location: Back Pain Descriptors / Indicators: Constant, Burning, Aching, Discomfort Pain Onset: More than a month ago Pain Frequency: Constant Pain Relieving Factors: tylenol  Pain Relieving Factors: tylenol  Diabetes: Yes CBG done?: No Did pt. bring in CBG monitor from home?: No  How often do you need to have someone help you when you read instructions, pamphlets, or other written materials from your doctor or pharmacy?: 1 - Never  Diabetic?  Yes  Nutrition Risk Assessment:  Has the patient had any N/V/D within the last 2 months?  No  Does the patient have any non-healing wounds?  No  Has the patient had any unintentional weight loss or weight gain?  No   Diabetes:  Is the patient diabetic?  Yes  If diabetic, was a CBG obtained today?  No  Did the patient bring in their glucometer from home?  Yes  How often do you monitor your  CBG's? 1 x a day.   Financial Strains and Diabetes Management:  Are you having any financial strains with the device, your supplies or your medication? No .  Does the patient want to be seen by Chronic Care Management for management of their diabetes?  No  Would the patient like to be referred to a Nutritionist or for Diabetic Management?  No   Diabetic Exams:  Diabetic Eye Exam:  . Pt has been advised about the importance in completing this exam. A referral has been placed today.   Diabetic Foot Exam: Pt has been advised about the importance in completing this exam. .    Interpreter Needed?: No  Information entered by :: Leroy Kennedy LPN   Activities of Daily  Living    12/26/2021   12:36 PM 09/26/2021    9:34 AM  In your present state of health, do you have any difficulty performing the following activities:  Hearing? 0 0  Vision? 1 0  Difficulty concentrating or making decisions? 0 0  Walking or climbing stairs? 0 1  Dressing or bathing? 0 0  Doing errands, shopping? 0   Preparing Food and eating ? N   Using the Toilet? N   In the past six months, have you accidently leaked urine? Y   Do you have problems with loss of bowel control? N   Managing your Medications? N   Managing your Finances? N   Housekeeping or managing your Housekeeping? Y     Patient Care Team: [name], DO as PCP - General (Family Medicine) [name] [name], MD as PCP - Cardiology (Cardiology) [name], MD as Consulting Physician (Cardiology) [name], MD as Consulting Physician (Urology) [name], MD as Consulting Physician (Nephrology) [name], MD as Referring Physician (Orthopedic Surgery) [name], MD as Attending Physician (Endocrinology) [name], MD as Referring Physician (Vascular Surgery) [name], RN as Case Manager ([place]) Lane Hacker, [facility] (Pharmacist)  Indicate any recent Medical Services you may have received from other than [facility] providers in the past year (date may be approximate).     Assessment:   This is a routine wellness examination for [place].  Hearing/Vision screen Hearing Screening - Comments:: No trouble hearing Vision Screening - Comments:: Up to date Unsure of name at this time Cataract s   Dietary issues and exercise activities discussed: Current Exercise Habits: The patient does not participate in regular exercise at present   Goals Addressed   None    Depression Screen    12/26/2021   12:35 PM 11/08/2021    2:12 PM 10/18/2021   10:51 AM 10/13/2021   11:30 AM 09/08/2021    9:17 AM 08/23/2021   11:32 AM 03/23/2021   11:15 AM  PHQ 2/9 Scores  PHQ - 2  Score 0 1 0 0 0 0 0  PHQ- 9 Score 0 5 2 1 1 1 1     Fall Risk    12/26/2021   12:23 PM 10/13/2021   11:30 AM 08/23/2021   11:32 AM 04/18/2021    1:39 PM 02/28/2021    8:41 AM  Fall Risk   Falls in the past year? 1 0 0 0 0  Number falls in past yr: 0 0 0    Injury with Fall? 0 0 0    Risk for fall due to :  No Fall Risks No Fall Risks    Follow up Falls evaluation completed;Education provided;Falls prevention discussed  Falls evaluation completed Falls evaluation completed      FALL RISK PREVENTION PERTAINING TO THE HOME:  Any stairs in or around the home? Yes  If so, are there any without handrails? No  Home free of loose throw rugs in walkways, pet beds, electrical cords, etc? Yes  Adequate lighting in your home to reduce risk of falls? Yes   ASSISTIVE DEVICES UTILIZED TO PREVENT FALLS:  Life alert? No  Use of a cane, walker or w/c? Yes  Grab bars in the bathroom? Yes  Shower chair or bench in shower? Yes  Elevated toilet seat or a handicapped toilet? Yes   TIMED UP AND GO:  Was the test performed? No .    Cognitive Function:        12/26/2021   12:22 PM 12/20/2020    2:41 PM 12/19/2019    2:46 PM 11/11/2019    2:41 PM 10/31/2017    3:48 PM  6CIT Screen  What Year? 0 points 0 points 0 points 0 points 0 points  What month? 0 points 0 points 0 points 0 points 0 points  What time? 0 points 0 points 0 points 0 points 0 points  Count back from 20 0 points 0 points 0 points 0 points 0 points  Months in reverse 0 points 0 points 0 points 0 points 0 points  Repeat phrase 0 points 0 points 0 points 0 points 0 points  Total Score 0 points 0 points 0 points 0 points 0 points    Immunizations Immunization History  Administered Date(s) Administered   Fluad Quad(high Dose 65+) 01/02/2019, 02/10/2020, 12/30/2020, 12/06/2021   Influenza, High Dose Seasonal PF 11/16/2016, 12/18/2017   Influenza,inj,Quad PF,6+ Mos 12/16/2014, 11/24/2015   PFIZER(Purple Top)SARS-COV-2 Vaccination  05/08/2019, 05/29/2019   Pneumococcal Conjugate-13 07/08/2014   Pneumococcal Polysaccharide-23 03/13/2012   Zoster, Live 03/13/2010    TDAP status: Due, Education has been provided regarding the importance of this vaccine. Advised may receive this vaccine at local pharmacy or Health Dept. Aware to provide a copy of the vaccination record if obtained from local pharmacy or Health Dept. Verbalized acceptance and understanding.  Flu Vaccine status: Up to date  Pneumococcal vaccine status: Up to date  Covid-19 vaccine status: Information provided on how to obtain vaccines.   Qualifies for Shingles Vaccine? Yes   Zostavax completed Yes   Shingrix Completed?: No.    Education has been provided regarding the importance of this vaccine. Patient has been advised to call insurance company to determine out of pocket expense if they have not yet received this vaccine. Advised may also receive vaccine at local pharmacy or Health Dept. Verbalized acceptance and understanding.  Screening Tests Health Maintenance  Topic Date Due   Zoster Vaccines- Shingrix (1 of 2) Never done   Diabetic kidney evaluation - Urine ACR  08/19/2021   OPHTHALMOLOGY EXAM  11/09/2021   COVID-19 Vaccine (3 - Pfizer risk series) 01/11/2022 (Originally 06/26/2019)   TETANUS/TDAP  06/18/2022 (Originally 08/19/1964)   COLONOSCOPY (Pts 45-67yr Insurance coverage will need to be confirmed)  12/27/2022 (Originally 01/28/2020)   HEMOGLOBIN A1C  05/24/2022   FOOT EXAM  09/09/2022   Diabetic kidney evaluation - GFR measurement  12/07/2022   MAMMOGRAM  07/28/2023   Pneumonia Vaccine 76 Years old  Completed   INFLUENZA VACCINE  Completed   DEXA SCAN  Completed   Hepatitis C Screening  Completed   HPV VACCINES  Aged Out    Health Maintenance  Health Maintenance Due  Topic Date Due   Zoster Vaccines- Shingrix (1 of 2) Never done   Diabetic kidney evaluation - Urine ACR  08/19/2021   OPHTHALMOLOGY EXAM  11/09/2021     Colorectal cancer screening: No longer required.   Mammogram status: Completed  . Repeat every year  Bone Density will schedule next year with mammogram  Lung Cancer Screening: (Low Dose CT Chest recommended if Age 57-80 years, 30 pack-year currently smoking OR have quit w/in 15years.) does not qualify.   Lung Cancer Screening Referral:   Additional Screening:  Hepatitis C Screening: does not qualify; Completed 2018  Vision Screening: Recommended annual ophthalmology exams for early detection of glaucoma and other disorders of the eye. Is the patient up to date with their annual eye exam?  Yes  Who is the provider or what is the name of the office in which the patient attends annual eye exams?  If pt is not established with a provider, would they like to be referred to a provider to establish care? No .   Dental Screening: Recommended annual dental exams for proper oral hygiene  Community Resource Referral / Chronic Care Management: CRR required this visit?  No   CCM required this visit?  No      Plan:     I have personally reviewed and noted the following in the patient's chart:   Medical and social history Use of alcohol, tobacco or illicit drugs  Current medications and supplements including opioid prescriptions. Patient is not currently taking opioid prescriptions. Functional ability and status Nutritional status Physical activity Advanced directives List of other physicians Hospitalizations, surgeries, and ER visits in previous 12 months Vitals Screenings to include cognitive, depression, and falls Referrals and appointments  In addition, I have reviewed and discussed with patient certain preventive protocols, quality metrics, and best practice recommendations. A written personalized care plan for preventive services as well as general preventive health recommendations were provided to patient.     Leroy Kennedy, LPN   [id number]   Nurse Notes:

## 2021-12-27 ENCOUNTER — Inpatient Hospital Stay: Payer: Medicare Other | Admitting: Internal Medicine

## 2021-12-27 ENCOUNTER — Inpatient Hospital Stay: Payer: Medicare Other

## 2021-12-29 ENCOUNTER — Ambulatory Visit: Payer: Medicare Other | Admitting: Radiation Oncology

## 2022-01-02 ENCOUNTER — Inpatient Hospital Stay: Payer: Medicare Other

## 2022-01-02 ENCOUNTER — Inpatient Hospital Stay: Payer: Medicare Other | Admitting: Internal Medicine

## 2022-01-02 NOTE — Assessment & Plan Note (Deleted)
#  Left upper lobe lung nodule 6 mm MAY 2021-slow-growing currently 9 mm May 2022 CT scan.  Difficulty biopsy-peripheral/subpleural.  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  Recommend repeating evaluation with CT scan in 6 months/ordered by radiation oncology. ? ??# Left breast stage I breast cancer ER PR positive HER-2 negative; status post lumpectomy.  On arimidex. Mammogram-May 2022- left mammo-WNL [Dr.Byrnett- awaiting in May 2023]; STABLE.  tolerating well except for hot flashes see below ? ?# Hot flashes -grade 1- monitor for now.  ? ?# BMD- FEB 2021- T score;  - 1.5.on ca+vit D-STABLE.  ? ?# CKD- III- GFR-32.[NOV 2021-]; STABLE.  Dr.Kolluru/follow-up with PCP. ? ?# Diabetes- Poorly controlled- 270s- defer to PCP.  ? ?# DISPOSITION:  ?#  Follow-up in  6 months MD;  labs-CBC BMP; -Dr.B ? ?# I reviewed the blood work- with the patient in detail; also reviewed the imaging independently [as summarized above]; and with the patient in detail.  ? ?

## 2022-01-03 ENCOUNTER — Ambulatory Visit (INDEPENDENT_AMBULATORY_CARE_PROVIDER_SITE_OTHER): Payer: Medicare Other | Admitting: Family Medicine

## 2022-01-03 ENCOUNTER — Encounter: Payer: Self-pay | Admitting: Family Medicine

## 2022-01-03 ENCOUNTER — Encounter: Payer: Medicare Other | Admitting: Thoracic Surgery (Cardiothoracic Vascular Surgery)

## 2022-01-03 VITALS — BP 121/77 | HR 78 | Wt 211.7 lb

## 2022-01-03 DIAGNOSIS — I200 Unstable angina: Secondary | ICD-10-CM

## 2022-01-03 DIAGNOSIS — S2242XG Multiple fractures of ribs, left side, subsequent encounter for fracture with delayed healing: Secondary | ICD-10-CM | POA: Diagnosis not present

## 2022-01-03 DIAGNOSIS — T148XXA Other injury of unspecified body region, initial encounter: Secondary | ICD-10-CM | POA: Diagnosis not present

## 2022-01-03 DIAGNOSIS — R079 Chest pain, unspecified: Secondary | ICD-10-CM

## 2022-01-03 NOTE — Progress Notes (Signed)
BP 121/77   Pulse 78   Wt 211 lb 11.2 oz (96 kg)   SpO2 97%   BMI 34.17 kg/m    Subjective:    Patient ID: Tammy [name], female    DOB: 03/01/1946, 76 y.o.   MRN: [id number]  HPI: Tammy [name] is a 76 y.o. female  Chief Complaint  Patient presents with   Rib Injury    Patient here to follow up on rib pain, patient states she is doing much better   She had some laser treatments with Dr. Sharlett Iles. Her pain is significantly better. She has not been able to fully move her L arm without pain, but it continues to improve. She decided not to see the cardiothoracic surgeon. She is feeling significantly better without it. No more significant chest pain. No shooting pain. No numbness or tingling. No other concerns or complaints a this time    Relevant past medical, surgical, family and social history reviewed and updated as indicated. Interim medical history since our last visit reviewed. Allergies and medications reviewed and updated.  Review of Systems  Constitutional: Negative.   Respiratory: Negative.    Cardiovascular: Negative.   Gastrointestinal: Negative.   Musculoskeletal: Negative.   Skin: Negative.   Neurological: Negative.   Psychiatric/Behavioral: Negative.      Per HPI unless specifically indicated above     Objective:    BP 121/77   Pulse 78   Wt 211 lb 11.2 oz (96 kg)   SpO2 97%   BMI 34.17 kg/m   Wt Readings from Last 3 Encounters:  01/03/22 211 lb 11.2 oz (96 kg)  12/06/21 214 lb (97.1 kg)  11/08/21 214 lb 3.2 oz (97.2 kg)    Physical Exam Vitals and nursing note reviewed.  Constitutional:      General: She is not in acute distress.    Appearance: Normal appearance. She is normal weight. She is not ill-appearing, toxic-appearing or diaphoretic.  HENT:     Head: Normocephalic and atraumatic.     Right Ear: External ear normal.     Left Ear: External ear normal.     Nose: Nose normal.     Mouth/Throat:     Mouth: Mucous membranes are moist.      Pharynx: Oropharynx is clear.  Eyes:     General: No scleral icterus.       Right eye: No discharge.        Left eye: No discharge.     Extraocular Movements: Extraocular movements intact.     Conjunctiva/sclera: Conjunctivae normal.     Pupils: Pupils are equal, round, and reactive to light.  Cardiovascular:     Rate and Rhythm: Normal rate and regular rhythm.     Pulses: Normal pulses.     Heart sounds: Normal heart sounds. No murmur heard.    No friction rub. No gallop.  Pulmonary:     Effort: Pulmonary effort is normal. No respiratory distress.     Breath sounds: Normal breath sounds. No stridor. No wheezing, rhonchi or rales.  Chest:     Chest wall: No tenderness.  Musculoskeletal:        General: Normal range of motion.     Cervical back: Normal range of motion and neck supple.  Skin:    General: Skin is warm and dry.     Capillary Refill: Capillary refill takes less than 2 seconds.     Coloration: Skin is not jaundiced or pale.  Findings: No bruising, erythema, lesion or rash.  Neurological:     General: No focal deficit present.     Mental Status: She is alert and oriented to person, place, and time. Mental status is at baseline.  Psychiatric:        Mood and Affect: Mood normal.        Behavior: Behavior normal.        Thought Content: Thought content normal.        Judgment: Judgment normal.     Results for orders placed or performed in visit on 70/01/74  Basic metabolic panel  Result Value Ref Range   Glucose 79 70 - 99 mg/dL   BUN 25 8 - 27 mg/dL   Creatinine, Ser 1.52 (H) 0.57 - 1.00 mg/dL   eGFR 35 (L) >59 mL/min/1.73   BUN/Creatinine Ratio 16 12 - 28   Sodium 141 134 - 144 mmol/L   Potassium 5.1 3.5 - 5.2 mmol/L   Chloride 102 96 - 106 mmol/L   CO2 24 20 - 29 mmol/L   Calcium 9.1 8.7 - 10.3 mg/dL  CBC and differential  Result Value Ref Range   Hemoglobin 14.4 12.0 - 16.0   HCT 44 36 - 46   Platelets 353 150 - 400 K/uL   WBC 11.6   VITAMIN D 25  Hydroxy (Vit-D Deficiency, Fractures)  Result Value Ref Range   Vit D, 25-Hydroxy 45   Basic metabolic panel  Result Value Ref Range   Glucose 114    BUN 24 (A) 4 - 21   CO2 32 (A) 13 - 22   Creatinine 1.7 (A) 0.5 - 1.1   Potassium 5.5 (A) 3.5 - 5.1 mEq/L   Sodium 143 137 - 147   Chloride 103 99 - 108  Comprehensive metabolic panel  Result Value Ref Range   Globulin 3.2    eGFR 32    Calcium 10.1 8.7 - 10.7   Albumin 4.0 3.5 - 5.0  Lipid panel  Result Value Ref Range   Triglycerides 203 (A) 40 - 160   Cholesterol 126 0 - 200   HDL 45 35 - 70   LDL Cholesterol 54   Hepatic function panel  Result Value Ref Range   Alkaline Phosphatase 76 25 - 125   ALT 10 7 - 35 U/L   AST 15 13 - 35   Bilirubin, Total 0.5   Hemoglobin A1c  Result Value Ref Range   Hemoglobin A1C 7.5   TSH  Result Value Ref Range   TSH 0.63 0.41 - 5.90  POCT erythrocyte sed rate, Non-automated  Result Value Ref Range   Sed Rate 45    *Note: Due to a large number of results and/or encounters for the requested time period, some results have not been displayed. A complete set of results can be found in Results Review.      Assessment & Plan:   Problem List Items Addressed This Visit   None Visit Diagnoses     Chest pain, unspecified type    -  Primary   Pain is significantly better. Declined to see CT surgery. Continue current regimen. Call with any concerns.    Closed fracture of multiple ribs of left side with delayed healing, subsequent encounter       Pain is significantly better. Declined to see CT surgery. Continue current regimen. Call with any concerns.   Traumatic hematoma       Pain is significantly better. Declined to  see CT surgery. Continue current regimen. Call with any concerns.        Follow up plan: Return for [place].

## 2022-01-09 ENCOUNTER — Encounter (INDEPENDENT_AMBULATORY_CARE_PROVIDER_SITE_OTHER): Payer: Self-pay

## 2022-01-09 ENCOUNTER — Other Ambulatory Visit: Payer: Self-pay | Admitting: Family Medicine

## 2022-01-09 DIAGNOSIS — E1165 Type 2 diabetes mellitus with hyperglycemia: Secondary | ICD-10-CM

## 2022-01-09 DIAGNOSIS — I25110 Atherosclerotic heart disease of native coronary artery with unstable angina pectoris: Secondary | ICD-10-CM

## 2022-01-09 DIAGNOSIS — Z170 Estrogen receptor positive status [ER+]: Secondary | ICD-10-CM

## 2022-01-10 ENCOUNTER — Inpatient Hospital Stay: Payer: Medicare Other | Attending: Internal Medicine

## 2022-01-10 ENCOUNTER — Encounter: Payer: Self-pay | Admitting: Internal Medicine

## 2022-01-10 ENCOUNTER — Telehealth: Payer: Self-pay | Admitting: Internal Medicine

## 2022-01-10 ENCOUNTER — Inpatient Hospital Stay (HOSPITAL_BASED_OUTPATIENT_CLINIC_OR_DEPARTMENT_OTHER): Payer: Medicare Other | Admitting: Internal Medicine

## 2022-01-10 DIAGNOSIS — E1122 Type 2 diabetes mellitus with diabetic chronic kidney disease: Secondary | ICD-10-CM | POA: Diagnosis not present

## 2022-01-10 DIAGNOSIS — C3412 Malignant neoplasm of upper lobe, left bronchus or lung: Secondary | ICD-10-CM

## 2022-01-10 DIAGNOSIS — R232 Flushing: Secondary | ICD-10-CM | POA: Insufficient documentation

## 2022-01-10 DIAGNOSIS — N951 Menopausal and female climacteric states: Secondary | ICD-10-CM | POA: Insufficient documentation

## 2022-01-10 DIAGNOSIS — Z170 Estrogen receptor positive status [ER+]: Secondary | ICD-10-CM

## 2022-01-10 DIAGNOSIS — Z79899 Other long term (current) drug therapy: Secondary | ICD-10-CM | POA: Insufficient documentation

## 2022-01-10 DIAGNOSIS — C50312 Malignant neoplasm of lower-inner quadrant of left female breast: Secondary | ICD-10-CM | POA: Diagnosis not present

## 2022-01-10 DIAGNOSIS — N183 Chronic kidney disease, stage 3 unspecified: Secondary | ICD-10-CM | POA: Diagnosis not present

## 2022-01-10 DIAGNOSIS — Z79811 Long term (current) use of aromatase inhibitors: Secondary | ICD-10-CM | POA: Diagnosis not present

## 2022-01-10 DIAGNOSIS — Z85118 Personal history of other malignant neoplasm of bronchus and lung: Secondary | ICD-10-CM | POA: Diagnosis not present

## 2022-01-10 LAB — CBC WITH DIFFERENTIAL/PLATELET
Abs Immature Granulocytes: 0.05 10*3/uL (ref 0.00–0.07)
Basophils Absolute: 0.1 10*3/uL (ref 0.0–0.1)
Basophils Relative: 1 %
Eosinophils Absolute: 0.2 10*3/uL (ref 0.0–0.5)
Eosinophils Relative: 2 %
HCT: 46.5 % — ABNORMAL HIGH (ref 36.0–46.0)
Hemoglobin: 14 g/dL (ref 12.0–15.0)
Immature Granulocytes: 1 %
Lymphocytes Relative: 26 %
Lymphs Abs: 2.6 10*3/uL (ref 0.7–4.0)
MCH: 28.2 pg (ref 26.0–34.0)
MCHC: 30.1 g/dL (ref 30.0–36.0)
MCV: 93.6 fL (ref 80.0–100.0)
Monocytes Absolute: 0.9 10*3/uL (ref 0.1–1.0)
Monocytes Relative: 9 %
Neutro Abs: 6 10*3/uL (ref 1.7–7.7)
Neutrophils Relative %: 61 %
Platelets: 266 10*3/uL (ref 150–400)
RBC: 4.97 MIL/uL (ref 3.87–5.11)
RDW: 14.6 % (ref 11.5–15.5)
WBC: 9.8 10*3/uL (ref 4.0–10.5)
nRBC: 0 % (ref 0.0–0.2)

## 2022-01-10 LAB — BASIC METABOLIC PANEL
Anion gap: 8 (ref 5–15)
BUN: 29 mg/dL — ABNORMAL HIGH (ref 8–23)
CO2: 25 mmol/L (ref 22–32)
Calcium: 9.3 mg/dL (ref 8.9–10.3)
Chloride: 106 mmol/L (ref 98–111)
Creatinine, Ser: 1.62 mg/dL — ABNORMAL HIGH (ref 0.44–1.00)
GFR, Estimated: 32 mL/min — ABNORMAL LOW (ref >60)
Glucose, Bld: 121 mg/dL — ABNORMAL HIGH (ref 70–99)
Potassium: 4.6 mmol/L (ref 3.5–5.1)
Sodium: 139 mmol/L (ref 135–145)

## 2022-01-10 NOTE — Telephone Encounter (Signed)
Message sent to schedule CT prior to 07/11/22.

## 2022-01-10 NOTE — Progress Notes (Signed)
Patient states she fell 2 weeks ago and has had a slight headache ever since.

## 2022-01-10 NOTE — Progress Notes (Signed)
I connected with Tammy [name] on 01/10/22 at  1:30 PM EDT by video enabled telemedicine visit and verified that I am speaking with the correct person using two identifiers.  I discussed the limitations, risks, security and privacy concerns of performing an evaluation and management service by telemedicine and the availability of in-person appointments. I also discussed with the patient that there may be a patient responsible charge related to this service. The patient expressed understanding and agreed to proceed.    Other persons participating in the visit and their role in the encounter: RN/medical reconciliation Patient's location: home Provider's location: office  Oncology History Overview Note   # July 2020 Left Breast- IMC; 14 mm; Grade: 2; Lymphovascular Invasion: Not identified;  pT1c pN0(sn); ER positive, PR positive, HER2 negative. [s/p lumpec; Dr.Byrnett]; rs/p RT [finished Sep, 9th 2020]  # Sep end 2020- START Arimidexa  # JAN 2022- 85m/left upper lobe lung nodule; PET positive; right upper lobe pneumonia/suspected reactive adenopathy. STAGE I Lung ca [suspected based on imaging no biopsy]- SBRT Dr.Crystal [May 2022]  # CKD-III/COPD/ CHF-compesated/PAD; CAD  DIAGNOSIS: Left breast cancer  STAGE: 1  ;GOALS: Cure  CURRENT/MOST RECENT THERAPY: arimidex   Malignant neoplasm of lower-inner quadrant of left female breast ([facility] [place])  08/14/2018 Initial Diagnosis   Malignant neoplasm of lower-inner quadrant of left female breast ([facility]   Carcinoma of lower-inner quadrant of left breast in female, estrogen receptor positive ([facility]  10/02/2018 Initial Diagnosis   Carcinoma of lower-inner quadrant of left breast in female, estrogen receptor positive ([facility]      Chief Complaint: Breast cancer/ lung cancer    History of present illness:Tammy [name] 76y.o.  female with history of female with history of early stage left upper lobe lung cancer; and early stage breast cancer ER/PR  positive on anastrozole is here for follow-up/review results of the CT scan.  Unfortunately the interim patient fell.  She hit her head.  Had extensive evaluation emergency room no internal trauma noted.   Patient denies any shortness of breath or cough.  Denies any fever chills.  No headaches.   Complains of mild hot flashes not any worse.    Observation/objective: Alert & oriented x 3. In No acute distress.   Assessment and plan: Carcinoma of lower-inner quadrant of left breast in female, estrogen receptor positive ([facility] #Left upper lobe lung nodule 6 mm MAY 2021-slow-growing currently 9 mm May 2022 CT scan.  Difficulty biopsy-peripheral/subpleural.  S/p SBRT [June 2022]; OCT 2023- CT scan- No evidence of pneumothorax, chest wall hematoma or osseous metastatic disease. Stable post treatment changes in the left upper lobe. No evidence of local recurrence or metastatic disease. Stable postsurgical changes in the left breast.   # Left breast stage I breast cancer ER PR positive HER-2 negative; status post lumpectomy.  On arimidex [until sep 2025- x5 years]. Mammogram-May 2023- left mammo-WNL [PCP]; STABLE.  tolerating well except for hot flashes see below  # Hot flashes -grade 1- monitor for now. STABLE.    # BMD- FEB 2021- T score;  - 1.5.on ca+vit D-STABLE.   # CKD- III- GFR-34 .[NOV 2021-]; STABLE.  Dr.Kolluru/follow-up with PCP.  # Diabetes- [CGM] Poorly controlled- 121s-improved- recent Hb A1c- 7.4 [from 9.5] - STABLE.   #Incidental findings on Imaging  CT OCT, 2023: emphysema; atherosclerosis I reviewed/discussed/counseled the patient.   # DISPOSITION:  #  Follow-up in  6 months MD;  labs-CBC BMP; non-contrast CT chest prior-; -Dr.B  # I reviewed the  blood work- with the patient in detail; also reviewed the imaging independently [as summarized above]; and with the patient in detail.     Follow-up instructions:  I discussed the assessment and treatment plan with the patient.   The patient was provided an opportunity to ask questions and all were answered.  The patient agreed with the plan and demonstrated understanding of instructions.  The patient was advised to call back or seek an in person evaluation if the symptoms worsen or if the condition fails to improve as anticipated.    Dr. Charlaine Dalton [place] [facility] 01/10/2022 3:11 PM

## 2022-01-10 NOTE — Assessment & Plan Note (Addendum)
#  Left upper lobe lung nodule 6 mm MAY 2021-slow-growing currently 9 mm May 2022 CT scan.  Difficulty biopsy-peripheral/subpleural.  S/p SBRT [June 2022]; OCT 2023- CT scan- No evidence of pneumothorax, chest wall hematoma or osseous metastatic disease. Stable post treatment changes in the left upper lobe. No evidence of local recurrence or metastatic disease. Stable postsurgical changes in the left breast.  # Left breast stage I breast cancer ER PR positive HER-2 negative; status post lumpectomy.  On arimidex [until sep 2025- x5 years]. Mammogram-May 2023- left mammo-WNL [PCP]; STABLE.  tolerating well except for hot flashes see below  # Hot flashes -grade 1- monitor for now. STABLE.    # BMD- FEB 2021- T score;  - 1.5.on ca+vit D-STABLE.   # CKD- III- GFR-34 .[NOV 2021-]; STABLE.  Dr.Kolluru/follow-up with PCP.  # Diabetes- [CGM] Poorly controlled- 121s-improved- recent Hb A1c- 7.4 [from 9.5] - STABLE.   #Incidental findings on Imaging  CT OCT, 2023: emphysema; atherosclerosis I reviewed/discussed/counseled the patient.   # DISPOSITION:  #  Follow-up in  6 months MD;  labs-CBC BMP; non-contrast CT chest prior-; -Dr.B  # I reviewed the blood work- with the patient in detail; also reviewed the imaging independently [as summarized above]; and with the patient in detail.

## 2022-01-11 ENCOUNTER — Ambulatory Visit (INDEPENDENT_AMBULATORY_CARE_PROVIDER_SITE_OTHER): Payer: Medicare Other

## 2022-01-11 ENCOUNTER — Telehealth: Payer: Medicare Other

## 2022-01-11 DIAGNOSIS — Q6211 Congenital occlusion of ureteropelvic junction: Secondary | ICD-10-CM | POA: Diagnosis not present

## 2022-01-11 DIAGNOSIS — K5730 Diverticulosis of large intestine without perforation or abscess without bleeding: Secondary | ICD-10-CM | POA: Diagnosis not present

## 2022-01-11 DIAGNOSIS — I25110 Atherosclerotic heart disease of native coronary artery with unstable angina pectoris: Secondary | ICD-10-CM

## 2022-01-11 DIAGNOSIS — E1165 Type 2 diabetes mellitus with hyperglycemia: Secondary | ICD-10-CM

## 2022-01-11 DIAGNOSIS — Q631 Lobulated, fused and horseshoe kidney: Secondary | ICD-10-CM | POA: Diagnosis not present

## 2022-01-11 DIAGNOSIS — I25118 Atherosclerotic heart disease of native coronary artery with other forms of angina pectoris: Secondary | ICD-10-CM

## 2022-01-11 DIAGNOSIS — Z170 Estrogen receptor positive status [ER+]: Secondary | ICD-10-CM

## 2022-01-11 DIAGNOSIS — I700 Atherosclerosis of aorta: Secondary | ICD-10-CM | POA: Diagnosis not present

## 2022-01-11 DIAGNOSIS — N261 Atrophy of kidney (terminal): Secondary | ICD-10-CM | POA: Diagnosis not present

## 2022-01-11 NOTE — Chronic Care Management (AMB) (Signed)
Chronic Care Management   CCM RN Visit Note  01/11/2022 Name: Tammy [name] MRN: [id number] DOB: 06-25-1945  Subjective: Tammy [name] is a 76 y.o. year old female who is a primary care patient of [name], DO. The patient was referred to the Chronic Care Management team for assistance with care management needs subsequent to provider initiation of CCM services and plan of care.    Today's Visit:  Engaged with patient by telephone for initial visit.        Goals Addressed             This Visit's Progress    CCM (DIABETES) EXPECTED OUTCOME: MONITOR, SELF-MANAGE AND REDUCE SYMPTOMS OF DIABETES       Current Barriers:  Knowledge Deficits related to how to effectively manage and maintain DM and goal of A1C <8% Chronic Disease Management support and education needs related to effective management DM Goal set by the provider <8.1%  Planned Interventions: Provided education to patient about basic DM disease process; Reviewed medications with patient and discussed importance of medication adherence;        Reviewed prescribed diet with patient heart healthy/ADA diet ; Counseled on importance of regular laboratory monitoring as prescribed;        Discussed plans with patient for ongoing care management follow up and provided patient with direct contact information for care management team;      Provided patient with written educational materials related to hypo and hyperglycemia and importance of correct treatment. Review of how to effectively manage drops in blood sugars or highs. The patient states the [facility] is really helpful and alerts her when her blood sugars is high or low;       Reviewed scheduled/upcoming provider appointments including: 02-24-2022 at 320 pm;         Advised patient, providing education and rationale, to check cbg when you have symptoms of low or high blood sugar and has freestyle libre  and record. The patient states the highest she has seen is 242 and  lowest is 9       call provider for findings outside established parameters;       Review of patient status, including review of consultants reports, relevant laboratory and other test results, and medications completed;       Advised patient to discuss changes in DM and diabetes health with provider;      Screening for signs and symptoms of depression related to chronic disease state;        Assessed social determinant of health barriers;         Symptom Management: Take medications as prescribed   Attend all scheduled provider appointments Call pharmacy for medication refills 3-7 days in advance of running out of medications Call provider office for new concerns or questions  call the Suicide and Crisis Lifeline: 988 call the [place] National Suicide Prevention Lifeline: 519-254-0439 or TTY: (934)266-3542 TTY 786-457-0523) to talk to a trained counselor call 1-800-273-TALK (toll free, 24 hour hotline) if experiencing a Mental Health or [place]  check feet daily for cuts, sores or redness trim toenails straight across manage portion size wash and dry feet carefully every day wear comfortable, cotton socks wear comfortable, well-fitting shoes  Follow Up Plan: Telephone follow up appointment with care management team member scheduled for: 03-01-2022 at 34 am       CCM Expected Outcome:  Monitor, Self-Manage and Reduce Symptoms of  breast cancer  Current Barriers:  Chronic Disease Management support and education needs related to effective management of breast cancer  Planned Interventions: Assessed patient understanding of cancer diagnosis and recommended treatment plan Reviewed upcoming provider appointments and treatment appointments Assessed available transportation to appointments and treatments. Has consistent/reliable transportation: Yes Assessed support system. Has consistent/reliable family or other support: Yes Nutrition assessment performed The  patient saw the oncologist yesterday and current treatment plan with Arimidex is effective. The patient states there were  no changes in the plan of care for her breast cancer treatment plan. Follows up accordingly with the oncologist  Symptom Management: Take medications as prescribed   Attend all scheduled provider appointments Call pharmacy for medication refills 3-7 days in advance of running out of medications Call provider office for new concerns or questions  call the Suicide and Crisis Lifeline: 988 call the [place] National Suicide Prevention Lifeline: 603-574-5092 or TTY: (628)239-1586 TTY 415-153-2655) to talk to a trained counselor call 1-800-273-TALK (toll free, 24 hour hotline) if experiencing a Mental Health or [place] Crisis   Follow Up Plan: Telephone follow up appointment with care management team member scheduled for: 03-01-2022 at 3 am       CCM Expected Outcome:  Monitor, Self-Manage and Reduce Symptoms of CAD       Current Barriers:  Chronic Disease Management support and education needs related to effective management of CAD  Planned Interventions: Assessed understanding of CAD diagnosis Medications reviewed including medications utilized in CAD treatment plan Provided education on importance of blood pressure control in management of CAD; Provided education on Importance of limiting foods high in cholesterol; Counseled on importance of regular laboratory monitoring as prescribed; Reviewed Importance of taking all medications as prescribed Reviewed Importance of attending all scheduled provider appointments Advised to report any changes in symptoms or exercise tolerance Advised patient to discuss changes in CAD or heart health with provider; Screening for signs and symptoms of depression related to chronic disease state;  Assessed social determinant of health barriers;   Symptom Management: Take medications as prescribed   Attend all scheduled  provider appointments Call provider office for new concerns or questions  call the Suicide and Crisis Lifeline: 988 call the [place] National Suicide Prevention Lifeline: 787-774-6083 or TTY: 534 602 0981 TTY (928)808-5693) to talk to a trained counselor call 1-800-273-TALK (toll free, 24 hour hotline) if experiencing a Mental Health or [place]   Follow Up Plan: Telephone follow up appointment with care management team member scheduled for: 03-01-2022       CCM Expected Outcome:  Monitor, Self-Manage and Reduce Symptoms of: Falls prevention and safety       Current Barriers:  Care Coordination needs related to falls prevention and safety in a patient with  high fall risk in a patient with recent falls with injury 2 weeks ago Chronic Disease Management support and education needs related to falls and safety prevention  Planned Interventions: Provided written and verbal education re: potential causes of falls and Fall prevention strategies Reviewed medications and discussed potential side effects of medications such as dizziness and frequent urination Advised patient of importance of notifying provider of falls. Education on calling the office for new falls or safety concerns Assessed for signs and symptoms of orthostatic hypotension Assessed for falls since last encounter. Last fall with injury was 2 weeks ago. The patient is feeling better now. Review of fall and safety prevention.  Assessed patients knowledge of fall risk prevention secondary to previously provided education Advised patient to  discuss new falls when they occur, changes in conditions that create increased risk for falls and safety concerns with provider Screening for signs and symptoms of depression related to chronic disease state Assessed social determinant of health barriers  Symptom Management: Take medications as prescribed   Attend all scheduled provider appointments Call provider office for new  concerns or questions  Work with the social worker to address care coordination needs and will continue to work with the clinical team to address health care and disease management related needs call the Suicide and Crisis Lifeline: 988 call the [place] National Suicide Prevention Lifeline: (251)362-8516 or TTY: (705)422-1550 TTY 405-004-2164) to talk to a trained counselor call 1-800-273-TALK (toll free, 24 hour hotline) if experiencing a Mental Health or [place]   Follow Up Plan: Telephone follow up appointment with care management team member scheduled for: 03-01-2022 at 1030 am       COMPLETED: Obtain Eye Exam-Diabetes Type 2       Follow Up Date 2 month follow up    - schedule appointment with eye doctor    Why is this important?   Eye check-ups are important when you have diabetes.  Vision loss can be prevented.    Notes:      COMPLETED: RNCM: Effective Management of pain       Care Coordination Interventions:see new plan of care Reviewed provider established plan for pain management. The patient has been dealing with acute pain in her left arm breast area for over 6 weeks. She has had an MRI of her spine and also been to several providers. She is going to [facility] to have an MRI of her arm and breast area on the left side from a provider she met recently that listened to her concerns. The patient states that she just wants answers to why she is having so much pain and discomfort in that area. She says she can hardly stand the pain Discussed importance of adherence to all scheduled medical appointments. Going to the eye doctor today to see about her surgery for removal of her cataracts in November. She has follow up with pcp on 12-06-2021 Counseled on the importance of reporting any/all new or changed pain symptoms or management strategies to pain management provider Advised patient to report to care team affect of pain on daily activities Discussed use of relaxation  techniques and/or diversional activities to assist with pain reduction (distraction, imagery, relaxation, massage, acupressure, TENS, heat, and cold application Reviewed with patient prescribed pharmacological and nonpharmacological pain relief strategies Advised patient to discuss unresolved pain, changes in level and intensity of pain, and recommendations for finding direct reason for the pain and discomfort with provider Screening for signs and symptoms of depression related to chronic disease state  Assessed social determinant of health barriers  Active listening / Reflection utilized  Emotional Support Provided         Plan:Telephone follow up appointment with care management team member scheduled for:  03-01-2022 at 45 am  Noreene Larsson RN, MSN, CCM RN Care Manager  Chronic Care Management Direct Number: [phone]

## 2022-01-11 NOTE — Chronic Care Management (AMB) (Signed)
Chronic Care Management Provider Comprehensive Care Plan    01/11/2022 Name: Tammy [name] MRN: [id number] DOB: Jul 22, 1945  Referral to Chronic Care Management (CCM) services was placed by Provider:  Dr. Wynetta Emery on Date: 01-09-2022.  Chronic Condition 1: CAD Provider Assessment and Plan   Under good control on current regimen. Continue current regimen. Continue to monitor. Call with any concerns. Refills given.            Relevant Medications    rosuvastatin (CRESTOR) 40 MG tablet    Hyperkalemia - Primary    Relevant Orders    Basic metabolic panel (Completed)     Expected Outcome/Goals Addressed This Visit (Provider CCM goals/Provider Assessment and plan   CCM (CAD) EXPECTED OUTCOME: MONITOR, SELF-MANAGE AND REDUCE SYMPTOMS OF CAD     Symptom Management Condition 1: Take all medications as prescribed Attend all scheduled provider appointments Call provider office for new concerns or questions  call the Suicide and Crisis Lifeline: 988 call the [place] National Suicide Prevention Lifeline: (818) 875-0559 or TTY: 703-259-8627 TTY (386)790-6820) to talk to a trained counselor call 1-800-273-TALK (toll free, 24 hour hotline) if experiencing a Mental Health or [place] Crisis   Chronic Condition 2: DM Provider Assessment and Plan  Doing much better with A1c of 7.5. Continue current regimen. Continue to monitor. Call with any concerns.          Relevant Medications    rosuvastatin (CRESTOR) 40 MG tablet    JARDIANCE 25 MG TABS tablet    glipiZIDE (GLUCOTROL) 5 MG tablet     Expected Outcome/Goals Addressed This Visit (Provider CCM goals/Provider Assessment and plan   CCM (DIABETES) EXPECTED OUTCOME: MONITOR, SELF-MANAGE AND REDUCE SYMPTOMS OF DIABETES   Symptom Management Condition 2: Take all medications as prescribed Attend all scheduled provider appointments Call pharmacy for medication refills 3-7 days in advance of running out of medications Call provider  office for new concerns or questions  call the Suicide and Crisis Lifeline: 988 call the [place] National Suicide Prevention Lifeline: 315-575-6298 or TTY: (445) 525-1786 TTY 361-610-0831) to talk to a trained counselor call 1-800-273-TALK (toll free, 24 hour hotline) if experiencing a Mental Health or [place]  check feet daily for cuts, sores or redness trim toenails straight across manage portion size wash and dry feet carefully every day wear comfortable, cotton socks wear comfortable, well-fitting shoes  Chronic Condition 3: Breast Cancer Provider Assessment and Plan #Left upper lobe lung nodule 6 mm MAY 2021-slow-growing currently 9 mm May 2022 CT scan.  Difficulty biopsy-peripheral/subpleural.  S/p SBRT [June 2022]; OCT 2023- CT scan- No evidence of pneumothorax, chest wall hematoma or osseous metastatic disease. Stable post treatment changes in the left upper lobe. No evidence of local recurrence or metastatic disease. Stable postsurgical changes in the left breast.    # Left breast stage I breast cancer ER PR positive HER-2 negative; status post lumpectomy.  On arimidex [until sep 2025- x5 years]. Mammogram-May 2023- left mammo-WNL [PCP]; STABLE.  tolerating well except for hot flashes see below   # Hot flashes -grade 1- monitor for now. STABLE.     # BMD- FEB 2021- T score;  - 1.5.on ca+vit D-STABLE.    # CKD- III- GFR-34 .[NOV 2021-]; STABLE.  Dr.Kolluru/follow-up with PCP.   Expected Outcome/Goals Addressed This Visit (Provider CCM goals/Provider Assessment and plan   CCM Expected Outcome:  Monitor, Self-Manage and Reduce Symptoms of  breast cancer  Symptom Management Condition 3: Take all medications as prescribed Attend  all scheduled provider appointments Call provider office for new concerns or questions  call the Suicide and Crisis Lifeline: 988 call the [place] National Suicide Prevention Lifeline: (680)244-3928 or TTY: 7375484441 TTY (612)581-1170) to  talk to a trained counselor call 1-800-273-TALK (toll free, 24 hour hotline) if experiencing a Mental Health or [place] Crisis    Problem List Patient Active Problem List   Diagnosis Date Noted   Primary cancer of left upper lobe of lung ([place]) 01/10/2022   Osteoarthritis of spine with radiculopathy, cervical region 10/20/2021   Osteoarthritis of spine with radiculopathy, thoracic region 10/20/2021   Acute respiratory failure with hypoxia ([place]) 06/09/2021   Chronic anticoagulation 06/09/2021   Elevated lipase 06/09/2021   Stage 3b chronic kidney disease ([place]) 06/08/2021   Atherosclerosis of native arteries of extremity with intermittent claudication ([place]) 02/15/2021   Cervical radiculopathy 10/14/2020   Sacroiliac inflammation ([place]) 10/14/2020   Lung nodule 08/19/2020   Fall 04/17/2020   Anemia 02/16/2020   Asthma 02/16/2020   History of cardiac catheterization 02/16/2020   History of left mastectomy 02/16/2020   Malignant tumor of breast ([place]) 02/16/2020   History of total knee arthroplasty 11/18/2019   Unstable angina ([place]) 04/03/2019   Dysphagia    Thickening of esophagus    Stomach irritation    Gastric polyp    Positive occult stool blood test    Polyp of colon    Hyperkalemia 11/27/2018   Proteinuria 11/27/2018   Type 2 diabetes mellitus with stage 3 chronic kidney disease ([place]) 11/27/2018   Carcinoma of lower-inner quadrant of left breast in female, estrogen receptor positive ([place]) 10/02/2018   Right knee pain 08/01/2017   Chronic right-sided low back pain with right-sided sciatica 05/28/2017   Gait abnormality 05/28/2017   Hypothyroid 07/18/2016   COPD (chronic obstructive pulmonary disease) ([place]) 07/18/2016   Encephalomalacia on imaging study 12/10/2015   Calcification of both carotid arteries 12/10/2015   Benign neoplasm of sigmoid colon    Benign neoplasm of cecum    Benign neoplasm of ascending colon    Bilateral atelectasis 04/11/2015   Fatty liver  disease, nonalcoholic 83/38/2505   Hydronephrosis of right kidney 04/11/2015   Abdominal aortic ectasia ([place]) 04/11/2015   Constipation 04/07/2015   Abdominal aortic atherosclerosis ([place]) 12/18/2014   Diverticulosis of colon 12/18/2014   Intermittent left-sided chest pain 11/18/2014   Rhinitis, allergic 10/20/2014   Coronary artery disease    Essential hypertension    Morbid obesity due to excess calories ([place])    Spinal stenosis of lumbar region 06/06/2014   Herniated nucleus pulposus 06/06/2014   Neuralgia neuritis, sciatic nerve 05/24/2014   Pulmonary embolism ([place]) 01/30/2014   Benign hypertensive renal disease 12/16/2013   Uncontrolled type 2 diabetes mellitus with hyperglycemia, with long-term current use of insulin ([place]) 12/16/2013   Overactive bladder 11/03/2013   Coronary atherosclerosis of native coronary artery 06/19/2013   Exertional dyspnea 06/19/2013   History of back surgery 06/19/2013   Hyperlipidemia 06/19/2013   PAD (peripheral artery disease) ([place]) 06/19/2013   Congenital obstruction of ureteropelvic junction 11/21/2011    Medication Management  Current Outpatient Medications:    acyclovir (ZOVIRAX) 800 MG tablet, Take 800 mg by mouth 4 (four) times daily., Disp: , Rfl:    albuterol (PROVENTIL) (2.5 MG/3ML) 0.083% nebulizer solution, Take 3 mLs (2.5 mg total) by nebulization every 6 (six) hours as needed for wheezing or shortness of breath., Disp: 225 mL, Rfl: 12   albuterol (VENTOLIN HFA) 108 (90 Base) MCG/ACT inhaler,  Inhale 2 puffs into the lungs every 6 (six) hours as needed for wheezing or shortness of breath., Disp: 8 g, Rfl: 6   anastrozole (ARIMIDEX) 1 MG tablet, TAKE 1 TABLET BY MOUTH DAILY, Disp: 90 tablet, Rfl: 1   blood glucose meter kit and supplies KIT, Dispense based on patient and insurance preference. Use one time daily as directed, E11.9, Disp: 1 each, Rfl: 0   Calcium Carb-Cholecalciferol (CALCIUM 600 + D) 600-200 MG-UNIT TABS, Take 1 tablet by  mouth daily. , Disp: , Rfl:    cholecalciferol (VITAMIN D3) 25 MCG (1000 UT) tablet, Take 1,000 Units by mouth daily., Disp: , Rfl:    CRANBERRY PO, Take 2 capsules by mouth daily., Disp: , Rfl:    cyclobenzaprine (FLEXERIL) 5 MG tablet, Take 1 tablet (5 mg total) by mouth 3 (three) times daily as needed for muscle spasms., Disp: 30 tablet, Rfl: 0   DULoxetine (CYMBALTA) 60 MG capsule, TAKE 2 CAPSULES BY MOUTH EVERY DAY, Disp: 180 capsule, Rfl: 1   ELIQUIS 2.5 MG TABS tablet, TAKE ONE TABLET BY MOUTH TWICE DAILY, Disp: 180 tablet, Rfl: 1   fluticasone (FLONASE) 50 MCG/ACT nasal spray, Place 1 spray into both nostrils daily., Disp: 16 g, Rfl: 2   gabapentin (NEURONTIN) 300 MG capsule, Take 2 capsules (600 mg total) by mouth 3 (three) times daily., Disp: 90 capsule, Rfl: 3   glipiZIDE (GLUCOTROL) 5 MG tablet, TAKE 1 TABLET BY MOUTH DAILY BEFORE BREAKFAST, Disp: 90 tablet, Rfl: 1   insulin degludec (TRESIBA FLEXTOUCH) 100 UNIT/ML FlexTouch Pen, Inject 40 Units into the skin daily., Disp: 30 mL, Rfl: 12   Insulin Pen Needle (PEN NEEDLES 3/16") 31G X 5 MM MISC, 1 each by Does not apply route daily., Disp: 100 each, Rfl: 12   isosorbide mononitrate (IMDUR) 60 MG 24 hr tablet, TAKE ONE TABLET BY MOUTH AT BEDTIME, Disp: 90 tablet, Rfl: 1   JARDIANCE 25 MG TABS tablet, Take 1 tablet (25 mg total) by mouth every morning., Disp: 90 tablet, Rfl: 1   Lactobacillus (PROBIOTIC ACIDOPHILUS PO), Take 1 capsule by mouth daily at 12 noon., Disp: , Rfl:    lidocaine (LIDODERM) 5 %, Place 1 patch onto the skin daily. Remove & Discard patch within 12 hours or as directed by MD, Disp: 30 patch, Rfl: 0   montelukast (SINGULAIR) 10 MG tablet, Take 1 tablet (10 mg total) by mouth at bedtime., Disp: 90 tablet, Rfl: 1   moxifloxacin (VIGAMOX) 0.5 % ophthalmic solution, Apply to eye., Disp: , Rfl:    MYRBETRIQ 50 MG TB24 tablet, TAKE 1 TABLET BY MOUTH DAILY, Disp: 30 tablet, Rfl: 2   nitrofurantoin, macrocrystal-monohydrate,  (MACROBID) 100 MG capsule, Take 100 mg by mouth daily., Disp: , Rfl:    nitroGLYCERIN (NITROSTAT) 0.4 MG SL tablet, Place 1 tablet (0.4 mg total) under the tongue every 5 (five) minutes as needed for chest pain. Total of 3 doses, Disp: 25 tablet, Rfl: 6   oxyCODONE-acetaminophen (PERCOCET/ROXICET) 5-325 MG tablet, TAKE 1/2 TO 1 TABLET BY MOUTH EVERY 8 HOURS AS NEEDED FOR SEVERE PAIN, Disp: 45 tablet, Rfl: 0   pantoprazole (PROTONIX) 40 MG tablet, TAKE 1 TABLET BY MOUTH DAILY, Disp: 90 tablet, Rfl: 1   prednisoLONE acetate (PRED FORTE) 1 % ophthalmic suspension, SMARTSIG:In Eye(s), Disp: , Rfl:    Pumpkin Seed-Soy Germ (AZO BLADDER CONTROL/GO-LESS PO), Take 1 tablet by mouth in the morning and at bedtime., Disp: , Rfl:    rosuvastatin (CRESTOR) 40 MG tablet, Take  1 tablet (40 mg total) by mouth daily., Disp: 90 tablet, Rfl: 1   senna (SENOKOT) 8.6 MG TABS tablet, Take 8.6 mg by mouth daily as needed for mild constipation., Disp: , Rfl:    SYNTHROID 150 MCG tablet, TAKE ONE TABLET ON AN EMPTY STOMACH WITHA GLASS OF WATER AT LEAST 30 TO 60 MINUTES BEFORE BREAKFAST, Disp: 90 tablet, Rfl: 3   trimethoprim (TRIMPEX) 100 MG tablet, TAKE ONE TABLET BY MOUTH EVERY DAY, Disp: 90 tablet, Rfl: 1   TRULICITY 4.5 MO/7.0BE SOPN, SMARTSIG:4.5 Milligram(s) SUB-Q Once a Week, Disp: , Rfl:    UNABLE TO FIND, Med Name: Energy, memory and mood enhancer, Disp: , Rfl:    ZINC CITRATE PO, Take 1 tablet by mouth daily., Disp: , Rfl:   Cognitive Assessment Identity Confirmed: : Name; DOB Cognitive Status: Normal   Functional Assessment Hearing Difficulty or Deaf: no Wear Glasses or Blind: no Concentrating, Remembering or Making Decisions Difficulty (CP): no Difficulty Communicating: no Difficulty Eating/Swallowing: no Walking or Climbing Stairs Difficulty: yes Mobility Management: uses precautions Dressing/Bathing Difficulty: no Doing Errands Independently Difficulty (such as shopping) (CP): yes Errands  Management: husband helps, high fall risk Change in Functional Status Since Onset of Current Illness/Injury: no   Caregiver Assessment  Primary Source of Support/Comfort: spouse Name of Support/Comfort Primary Source: Tammy [name] in Home: spouse Name(s) of People in Home: Husband- Tammy [name] Family Caregiver if Needed: spouse Family Caregiver Names: Husband- Tammy [name] Primary Roles/Responsibilities: retired   Planned Interventions  Provided education to patient about basic DM disease process; Reviewed medications with patient and discussed importance of medication adherence;        Reviewed prescribed diet with patient heart healthy/ADA diet ; Counseled on importance of regular laboratory monitoring as prescribed;        Discussed plans with patient for ongoing care management follow up and provided patient with direct contact information for care management team;      Provided patient with written educational materials related to hypo and hyperglycemia and importance of correct treatment. Review of how to effectively manage drops in blood sugars or highs. The patient states the [facility] is really helpful and alerts her when her blood sugars is high or low;       Reviewed scheduled/upcoming provider appointments including: 02-24-2022 at 320 pm;         Advised patient, providing education and rationale, to check cbg when you have symptoms of low or high blood sugar and has freestyle libre  and record. The patient states the highest she has seen is 242 and lowest is 67       call provider for findings outside established parameters;       Review of patient status, including review of consultants reports, relevant laboratory and other test results, and medications completed;       Advised patient to discuss changes in DM and diabetes health with provider;      Screening for signs and symptoms of depression related to chronic disease state;        Assessed social determinant of  health Assessed patient understanding of cancer diagnosis and recommended treatment plan Reviewed upcoming provider appointments and treatment appointments Assessed available transportation to appointments and treatments. Has consistent/reliable transportation: Yes Assessed support system. Has consistent/reliable family or other support: Yes Nutrition assessment performed The patient saw the oncologist yesterday and current treatment plan with Arimidex is effective. The patient states there were  no changes in the plan of care for her breast  cancer treatment plan. Follows up accordingly with the oncologist Assessed understanding of CAD diagnosis Medications reviewed including medications utilized in CAD treatment plan Provided education on importance of blood pressure control in management of CAD; Provided education on Importance of limiting foods high in cholesterol; Counseled on importance of regular laboratory monitoring as prescribed; Reviewed Importance of taking all medications as prescribed Reviewed Importance of attending all scheduled provider appointments Advised to report any changes in symptoms or exercise tolerance Advised patient to discuss changes in CAD or heart health with provider; Screening for signs and symptoms of depression related to chronic disease state;  Assessed social determinant of health barriers;   Interaction and coordination with outside resources, practitioners, and providers See CCM Referral  Care Plan: Available in MyChart

## 2022-01-11 NOTE — Patient Instructions (Signed)
Please call the care guide team at [phone] if you need to cancel or reschedule your appointment.   If you are experiencing a Mental Health or Behavioral Health Crisis or need someone to talk to, please call the Suicide and Crisis Lifeline: 988 call the [place] National Suicide Prevention Lifeline: 1-800-273-8255 or TTY: 1-800-799-4 TTY (1-800-799-4889) to talk to a trained counselor call 1-800-273-TALK (toll free, 24 hour hotline)   Following is a copy of your full provider care plan:   Goals Addressed             This Visit's Progress    CCM (DIABETES) EXPECTED OUTCOME: MONITOR, SELF-MANAGE AND REDUCE SYMPTOMS OF DIABETES       Current Barriers:  Knowledge Deficits related to how to effectively manage and maintain DM and goal of A1C <8% Chronic Disease Management support and education needs related to effective management DM Goal set by the provider <8.1%  Planned Interventions: Provided education to patient about basic DM disease process; Reviewed medications with patient and discussed importance of medication adherence;        Reviewed prescribed diet with patient heart healthy/ADA diet ; Counseled on importance of regular laboratory monitoring as prescribed;        Discussed plans with patient for ongoing care management follow up and provided patient with direct contact information for care management team;      Provided patient with written educational materials related to hypo and hyperglycemia and importance of correct treatment. Review of how to effectively manage drops in blood sugars or highs. The patient states the Freestyle Libre is really helpful and alerts her when her blood sugars is high or low;       Reviewed scheduled/upcoming provider appointments including: 02-24-2022 at 320 pm;         Advised patient, providing education and rationale, to check cbg when you have symptoms of low or high blood sugar and has freestyle libre  and record. The patient states the highest  she has seen is 242 and lowest is 58       call provider for findings outside established parameters;       Review of patient status, including review of consultants reports, relevant laboratory and other test results, and medications completed;       Advised patient to discuss changes in DM and diabetes health with provider;      Screening for signs and symptoms of depression related to chronic disease state;        Assessed social determinant of health barriers;         Symptom Management: Take medications as prescribed   Attend all scheduled provider appointments Call pharmacy for medication refills 3-7 days in advance of running out of medications Call provider office for new concerns or questions  call the Suicide and Crisis Lifeline: 988 call the [place] National Suicide Prevention Lifeline: 1-800-273-8255 or TTY: 1-800-799-4 TTY (1-800-799-4889) to talk to a trained counselor call 1-800-273-TALK (toll free, 24 hour hotline) if experiencing a Mental Health or Behavioral Health Crisis  check feet daily for cuts, sores or redness trim toenails straight across manage portion size wash and dry feet carefully every day wear comfortable, cotton socks wear comfortable, well-fitting shoes  Follow Up Plan: Telephone follow up appointment with care management team member scheduled for: 03-01-2022 at 1030 am       CCM Expected Outcome:  Monitor, Self-Manage and Reduce Symptoms of  breast cancer       Current Barriers:  Chronic   Disease Management support and education needs related to effective management of breast cancer  Planned Interventions: Assessed patient understanding of cancer diagnosis and recommended treatment plan Reviewed upcoming provider appointments and treatment appointments Assessed available transportation to appointments and treatments. Has consistent/reliable transportation: Yes Assessed support system. Has consistent/reliable family or other support: Yes Nutrition  assessment performed The patient saw the oncologist yesterday and current treatment plan with Arimidex is effective. The patient states there were  no changes in the plan of care for her breast cancer treatment plan. Follows up accordingly with the oncologist  Symptom Management: Take medications as prescribed   Attend all scheduled provider appointments Call pharmacy for medication refills 3-7 days in advance of running out of medications Call provider office for new concerns or questions  call the Suicide and Crisis Lifeline: 988 call the [place] National Suicide Prevention Lifeline: 603-177-8098 or TTY: 205-179-2556 TTY 641-307-1585) to talk to a trained counselor call 1-800-273-TALK (toll free, 24 hour hotline) if experiencing a Mental Health or [place] Crisis   Follow Up Plan: Telephone follow up appointment with care management team member scheduled for: 03-01-2022 at 60 am       CCM Expected Outcome:  Monitor, Self-Manage and Reduce Symptoms of CAD       Current Barriers:  Chronic Disease Management support and education needs related to effective management of CAD  Planned Interventions: Assessed understanding of CAD diagnosis Medications reviewed including medications utilized in CAD treatment plan Provided education on importance of blood pressure control in management of CAD; Provided education on Importance of limiting foods high in cholesterol; Counseled on importance of regular laboratory monitoring as prescribed; Reviewed Importance of taking all medications as prescribed Reviewed Importance of attending all scheduled provider appointments Advised to report any changes in symptoms or exercise tolerance Advised patient to discuss changes in CAD or heart health with provider; Screening for signs and symptoms of depression related to chronic disease state;  Assessed social determinant of health barriers;   Symptom Management: Take medications as prescribed    Attend all scheduled provider appointments Call provider office for new concerns or questions  call the Suicide and Crisis Lifeline: 988 call the [place] National Suicide Prevention Lifeline: 647-813-8076 or TTY: (514) 822-9253 TTY (412)476-2530) to talk to a trained counselor call 1-800-273-TALK (toll free, 24 hour hotline) if experiencing a Mental Health or [place]   Follow Up Plan: Telephone follow up appointment with care management team member scheduled for: 03-01-2022       CCM Expected Outcome:  Monitor, Self-Manage and Reduce Symptoms of: Falls prevention and safety       Current Barriers:  Care Coordination needs related to falls prevention and safety in a patient with  high fall risk in a patient with recent falls with injury 2 weeks ago Chronic Disease Management support and education needs related to falls and safety prevention  Planned Interventions: Provided written and verbal education re: potential causes of falls and Fall prevention strategies Reviewed medications and discussed potential side effects of medications such as dizziness and frequent urination Advised patient of importance of notifying provider of falls. Education on calling the office for new falls or safety concerns Assessed for signs and symptoms of orthostatic hypotension Assessed for falls since last encounter. Last fall with injury was 2 weeks ago. The patient is feeling better now. Review of fall and safety prevention.  Assessed patients knowledge of fall risk prevention secondary to previously provided education Advised patient to discuss new falls when  they occur, changes in conditions that create increased risk for falls and safety concerns with provider Screening for signs and symptoms of depression related to chronic disease state Assessed social determinant of health barriers  Symptom Management: Take medications as prescribed   Attend all scheduled provider appointments Call  provider office for new concerns or questions  Work with the social worker to address care coordination needs and will continue to work with the clinical team to address health care and disease management related needs call the Suicide and Crisis Lifeline: 988 call the [place] National Suicide Prevention Lifeline: 224-774-0938 or TTY: 814 857 3252 TTY 520-326-3803) to talk to a trained counselor call 1-800-273-TALK (toll free, 24 hour hotline) if experiencing a Mental Health or [place]   Follow Up Plan: Telephone follow up appointment with care management team member scheduled for: 03-01-2022 at 1030 am       COMPLETED: Obtain Eye Exam-Diabetes Type 2       Follow Up Date 2 month follow up    - schedule appointment with eye doctor    Why is this important?   Eye check-ups are important when you have diabetes.  Vision loss can be prevented.    Notes:      COMPLETED: RNCM: Effective Management of pain       Care Coordination Interventions:see new plan of care Reviewed provider established plan for pain management. The patient has been dealing with acute pain in her left arm breast area for over 6 weeks. She has had an MRI of her spine and also been to several providers. She is going to [facility] to have an MRI of her arm and breast area on the left side from a provider she met recently that listened to her concerns. The patient states that she just wants answers to why she is having so much pain and discomfort in that area. She says she can hardly stand the pain Discussed importance of adherence to all scheduled medical appointments. Going to the eye doctor today to see about her surgery for removal of her cataracts in November. She has follow up with pcp on 12-06-2021 Counseled on the importance of reporting any/all new or changed pain symptoms or management strategies to pain management provider Advised patient to report to care team affect of pain on daily  activities Discussed use of relaxation techniques and/or diversional activities to assist with pain reduction (distraction, imagery, relaxation, massage, acupressure, TENS, heat, and cold application Reviewed with patient prescribed pharmacological and nonpharmacological pain relief strategies Advised patient to discuss unresolved pain, changes in level and intensity of pain, and recommendations for finding direct reason for the pain and discomfort with provider Screening for signs and symptoms of depression related to chronic disease state  Assessed social determinant of health barriers  Active listening / Reflection utilized  Emotional Support Provided         Patient verbalizes understanding of instructions and care plan provided today and agrees to view in [place]. Active MyChart status and patient understanding of how to access instructions and care plan via MyChart confirmed with patient.     Telephone follow up appointment with care management team member scheduled for: 03/01/2022 at 52 am

## 2022-01-12 ENCOUNTER — Encounter: Payer: Self-pay | Admitting: Radiation Oncology

## 2022-01-12 ENCOUNTER — Ambulatory Visit
Admission: RE | Admit: 2022-01-12 | Discharge: 2022-01-12 | Disposition: A | Discharge status: Home / Self Care | Payer: Medicare Other | Source: Ambulatory Visit | Attending: Radiation Oncology | Admitting: Radiation Oncology

## 2022-01-12 ENCOUNTER — Telehealth: Payer: Self-pay

## 2022-01-12 VITALS — BP 136/79 | HR 81 | Resp 20 | Ht 66.0 in

## 2022-01-12 DIAGNOSIS — S2242XD Multiple fractures of ribs, left side, subsequent encounter for fracture with routine healing: Secondary | ICD-10-CM | POA: Diagnosis not present

## 2022-01-12 DIAGNOSIS — R911 Solitary pulmonary nodule: Secondary | ICD-10-CM | POA: Diagnosis not present

## 2022-01-12 DIAGNOSIS — C3412 Malignant neoplasm of upper lobe, left bronchus or lung: Secondary | ICD-10-CM

## 2022-01-12 DIAGNOSIS — Z923 Personal history of irradiation: Secondary | ICD-10-CM | POA: Diagnosis not present

## 2022-01-12 NOTE — Telephone Encounter (Signed)
error 

## 2022-01-12 NOTE — Progress Notes (Signed)
Radiation Oncology Follow up Note  Name: Tammy [name]   Date:   01/12/2022 MRN:  [id number] DOB: Nov 10, 1945    This 76 y.o. female presents to the clinic today for 1-month follow-up status post SBRT to her left upper lobe and patient previously treated for left breast cancer.  She was treated for probable stage I non-small cell lung cancer.  REFERRING PROVIDER: [name], DO  HPI: Patient is a 76 year old female now out 16 months having completed SBRT to her left upper lobe for a presumed stage I non-small cell lung cancer.  She has developed subacute or subacute fractures of her left third fourth rib by CT criteria.  This is been painful and is slowly resolving over time..  She specifically denies excessive cough hemoptysis or chest tightness.  She did have a fall off a chair and paramedics did cause some further trauma to her left ribs on lifting her up according to the patient.  She had mammograms back in May which I have reviewed were BI-RADS 2 benign  COMPLICATIONS OF TREATMENT: none  FOLLOW UP COMPLIANCE: keeps appointments   PHYSICAL EXAM:  BP 136/79 (BP Location: Right Arm, Patient Position: Sitting, Cuff Size: Large)   Pulse 81   Resp 20   Ht 5\' 6"  (1.676 m)   BMI 33.75 kg/m  Well-developed well-nourished patient in NAD. HEENT reveals PERLA, EOMI, discs not visualized.  Oral cavity is clear. No oral mucosal lesions are identified. Neck is clear without evidence of cervical or supraclavicular adenopathy. Lungs are clear to A&P. Cardiac examination is essentially unremarkable with regular rate and rhythm without murmur rub or thrill. Abdomen is benign with no organomegaly or masses noted. Motor sensory and DTR levels are equal and symmetric in the upper and lower extremities. Cranial nerves II through XII are grossly intact. Proprioception is intact. No peripheral adenopathy or edema is identified. No motor or sensory levels are noted. Crude visual fields are within normal  range.  RADIOLOGY RESULTS: CT scans and mammograms reviewed compatible with above-stated findings  PLAN: Present time patient is doing well with no evidence of disease.  Not sure radiation had anything to do with her rib fractures although possibly previous radiation to her left breast as well as SBRT could put her ribs at slightly higher risk of fracture.  They seem to be slowly resolving.  I have asked to see her back in 6 months for follow-up with a repeat CT scan of her chest.  She continues close follow-up care with medical oncology.  I would like to take this opportunity to thank you for allowing me to participate in the care of your patient.[name], MD

## 2022-01-17 ENCOUNTER — Encounter (INDEPENDENT_AMBULATORY_CARE_PROVIDER_SITE_OTHER): Payer: Medicare Other | Admitting: Ophthalmology

## 2022-01-17 DIAGNOSIS — I10 Essential (primary) hypertension: Secondary | ICD-10-CM | POA: Diagnosis not present

## 2022-01-17 DIAGNOSIS — H338 Other retinal detachments: Secondary | ICD-10-CM | POA: Diagnosis not present

## 2022-01-17 DIAGNOSIS — H35033 Hypertensive retinopathy, bilateral: Secondary | ICD-10-CM

## 2022-01-17 DIAGNOSIS — H353111 Nonexudative age-related macular degeneration, right eye, early dry stage: Secondary | ICD-10-CM | POA: Diagnosis not present

## 2022-01-17 DIAGNOSIS — H3412 Central retinal artery occlusion, left eye: Secondary | ICD-10-CM | POA: Diagnosis not present

## 2022-01-18 ENCOUNTER — Encounter (INDEPENDENT_AMBULATORY_CARE_PROVIDER_SITE_OTHER): Payer: Medicare Other | Admitting: Ophthalmology

## 2022-01-30 ENCOUNTER — Other Ambulatory Visit: Payer: Self-pay | Admitting: Family Medicine

## 2022-01-30 DIAGNOSIS — H2512 Age-related nuclear cataract, left eye: Secondary | ICD-10-CM | POA: Diagnosis not present

## 2022-01-30 NOTE — Telephone Encounter (Signed)
Requested medication (s) are due for refill today: routing for review.  Requested medication (s) are on the active medication list: yes  Last refill:  10/20/21  Future visit scheduled: yes  Notes to clinic:  Unable to refill per protocol, Rx is for 400mg  capsule, the medication list has gabapentin for 300 mg. Routing for review.     Requested Prescriptions  Pending Prescriptions Disp Refills   gabapentin (NEURONTIN) 400 MG capsule [Pharmacy Med Name: GABAPENTIN 400 MG CAP] 270 capsule 1    Sig: TAKE 1 CAPSULE BY MOUTH THREE TIMES DAILY     Neurology: Anticonvulsants - gabapentin Failed - 01/30/2022 10:05 AM      Failed - Cr in normal range and within 360 days    Creatinine  Date Value Ref Range Status  09/20/2011 1.31 (H) 0.60 - 1.30 mg/dL Final   Creatinine, Ser  Date Value Ref Range Status  01/10/2022 1.62 (H) 0.44 - 1.00 mg/dL Final         Passed - Completed PHQ-2 or PHQ-9 in the last 360 days      Passed - Valid encounter within last 12 months    Recent Outpatient Visits           3 weeks ago Chest pain, unspecified type   [place], Megan P, DO   1 month ago Hyperkalemia   [place], Megan P, DO   2 months ago Upper respiratory tract infection, unspecified type   [place], Megan P, DO   3 months ago Intermittent left-sided chest pain   [place], DO   3 months ago Intermittent left-sided chest pain   [facility] Jon Billings, NP       Future Appointments             In 3 weeks Wynetta Emery, Barb Merino, DO [employer]

## 2022-02-09 DIAGNOSIS — E1159 Type 2 diabetes mellitus with other circulatory complications: Secondary | ICD-10-CM

## 2022-02-09 DIAGNOSIS — Z170 Estrogen receptor positive status [ER+]: Secondary | ICD-10-CM | POA: Diagnosis not present

## 2022-02-09 DIAGNOSIS — Z794 Long term (current) use of insulin: Secondary | ICD-10-CM

## 2022-02-09 DIAGNOSIS — I25110 Atherosclerotic heart disease of native coronary artery with unstable angina pectoris: Secondary | ICD-10-CM

## 2022-02-09 DIAGNOSIS — C50312 Malignant neoplasm of lower-inner quadrant of left female breast: Secondary | ICD-10-CM

## 2022-02-14 ENCOUNTER — Encounter (INDEPENDENT_AMBULATORY_CARE_PROVIDER_SITE_OTHER): Payer: Self-pay | Admitting: Vascular Surgery

## 2022-02-14 ENCOUNTER — Ambulatory Visit (INDEPENDENT_AMBULATORY_CARE_PROVIDER_SITE_OTHER): Payer: Medicare Other

## 2022-02-14 ENCOUNTER — Ambulatory Visit (INDEPENDENT_AMBULATORY_CARE_PROVIDER_SITE_OTHER): Payer: Medicare Other | Admitting: Vascular Surgery

## 2022-02-14 VITALS — BP 111/63 | HR 76 | Resp 17 | Ht 66.0 in | Wt 209.0 lb

## 2022-02-14 DIAGNOSIS — I200 Unstable angina: Secondary | ICD-10-CM

## 2022-02-14 DIAGNOSIS — I70211 Atherosclerosis of native arteries of extremities with intermittent claudication, right leg: Secondary | ICD-10-CM

## 2022-02-14 DIAGNOSIS — E782 Mixed hyperlipidemia: Secondary | ICD-10-CM | POA: Diagnosis not present

## 2022-02-14 DIAGNOSIS — E1122 Type 2 diabetes mellitus with diabetic chronic kidney disease: Secondary | ICD-10-CM

## 2022-02-14 DIAGNOSIS — Z794 Long term (current) use of insulin: Secondary | ICD-10-CM | POA: Diagnosis not present

## 2022-02-14 DIAGNOSIS — I10 Essential (primary) hypertension: Secondary | ICD-10-CM | POA: Diagnosis not present

## 2022-02-14 DIAGNOSIS — N183 Chronic kidney disease, stage 3 unspecified: Secondary | ICD-10-CM

## 2022-02-14 DIAGNOSIS — N1832 Chronic kidney disease, stage 3b: Secondary | ICD-10-CM

## 2022-02-14 NOTE — Assessment & Plan Note (Signed)
ABIs today are stable at 0.85 on the right and 0.84 on the left with digit pressures of 71 on the right and 68 on the left at rest.  Her symptoms however seem to be deteriorating.  She describes severe claudication symptoms worse on the right than the left.  At this point, she is very interested in proceeding with revascularization.  She had both legs performed about a decade ago with good results and has had worsening symptoms now over the past year or so.  We discussed that she is not currently in a limb threatening situation but if she is having lifestyle limitation intervention is reasonable.  She says that she is.  We will plan on performing revascularization of the right lower extremity first and if she has good results proceed with left lower extremity revascularization at another visit a week or 2 later.  I discussed the risks and benefits of the procedure.  Patient and her husband are agreeable to proceed.

## 2022-02-14 NOTE — Progress Notes (Signed)
MRN : [id number]  Tammy [name] is a 76 y.o. (02-03-1946) female who presents with chief complaint of  Chief Complaint  Patient presents with   Follow-up    ultrasound  .  History of Present Illness: Patient returns today in follow up of her claudication symptoms.  These have gotten significantly worse even since her last visit.  She had a drop in her ABI at the last visit and had had some worsening symptoms but they are much worse now.  Her right leg gives out on her and her cramping and pain with activity is now happening at very short distances of walking.  No open wounds.  The pain does not wake her from sleep. ABIs today are stable at 0.85 on the right and 0.84 on the left with digit pressures of 71 on the right and 68 on the left at rest.   Current Outpatient Medications  Medication Sig Dispense Refill   acyclovir (ZOVIRAX) 800 MG tablet Take 800 mg by mouth 4 (four) times daily.     albuterol (PROVENTIL) (2.5 MG/3ML) 0.083% nebulizer solution Take 3 mLs (2.5 mg total) by nebulization every 6 (six) hours as needed for wheezing or shortness of breath. 225 mL 12   albuterol (VENTOLIN HFA) 108 (90 Base) MCG/ACT inhaler Inhale 2 puffs into the lungs every 6 (six) hours as needed for wheezing or shortness of breath. 8 g 6   anastrozole (ARIMIDEX) 1 MG tablet TAKE 1 TABLET BY MOUTH DAILY 90 tablet 1   blood glucose meter kit and supplies KIT Dispense based on patient and insurance preference. Use one time daily as directed, E11.9 1 each 0   Calcium Carb-Cholecalciferol (CALCIUM 600 + D) 600-200 MG-UNIT TABS Take 1 tablet by mouth daily.      cholecalciferol (VITAMIN D3) 25 MCG (1000 UT) tablet Take 1,000 Units by mouth daily.     CRANBERRY PO Take 2 capsules by mouth daily.     cyclobenzaprine (FLEXERIL) 5 MG tablet Take 1 tablet (5 mg total) by mouth 3 (three) times daily as needed for muscle spasms. 30 tablet 0   DULoxetine (CYMBALTA) 60 MG capsule TAKE 2 CAPSULES BY MOUTH EVERY DAY 180  capsule 1   ELIQUIS 2.5 MG TABS tablet TAKE ONE TABLET BY MOUTH TWICE DAILY 180 tablet 1   fluticasone (FLONASE) 50 MCG/ACT nasal spray Place 1 spray into both nostrils daily. 16 g 2   gabapentin (NEURONTIN) 300 MG capsule Take 2 capsules (600 mg total) by mouth 3 (three) times daily. 90 capsule 3   glipiZIDE (GLUCOTROL) 5 MG tablet TAKE 1 TABLET BY MOUTH DAILY BEFORE BREAKFAST 90 tablet 1   insulin degludec (TRESIBA FLEXTOUCH) 100 UNIT/ML FlexTouch Pen Inject 40 Units into the skin daily. 30 mL 12   Insulin Pen Needle (PEN NEEDLES 3/16") 31G X 5 MM MISC 1 each by Does not apply route daily. 100 each 12   isosorbide mononitrate (IMDUR) 60 MG 24 hr tablet TAKE ONE TABLET BY MOUTH AT BEDTIME 90 tablet 1   JARDIANCE 25 MG TABS tablet Take 1 tablet (25 mg total) by mouth every morning. 90 tablet 1   Lactobacillus (PROBIOTIC ACIDOPHILUS PO) Take 1 capsule by mouth daily at 12 noon.     lidocaine (LIDODERM) 5 % Place 1 patch onto the skin daily. Remove & Discard patch within 12 hours or as directed by MD 30 patch 0   montelukast (SINGULAIR) 10 MG tablet Take 1 tablet (10 mg total) by mouth  at bedtime. 90 tablet 1   moxifloxacin (VIGAMOX) 0.5 % ophthalmic solution Apply to eye.     MYRBETRIQ 50 MG TB24 tablet TAKE 1 TABLET BY MOUTH DAILY 30 tablet 2   nitrofurantoin, macrocrystal-monohydrate, (MACROBID) 100 MG capsule Take 100 mg by mouth daily.     nitroGLYCERIN (NITROSTAT) 0.4 MG SL tablet Place 1 tablet (0.4 mg total) under the tongue every 5 (five) minutes as needed for chest pain. Total of 3 doses 25 tablet 6   oxyCODONE-acetaminophen (PERCOCET/ROXICET) 5-325 MG tablet TAKE 1/2 TO 1 TABLET BY MOUTH EVERY 8 HOURS AS NEEDED FOR SEVERE PAIN 45 tablet 0   pantoprazole (PROTONIX) 40 MG tablet TAKE 1 TABLET BY MOUTH DAILY 90 tablet 1   prednisoLONE acetate (PRED FORTE) 1 % ophthalmic suspension SMARTSIG:In Eye(s)     Pumpkin Seed-Soy Germ (AZO BLADDER CONTROL/GO-LESS PO) Take 1 tablet by mouth in the  morning and at bedtime.     rosuvastatin (CRESTOR) 40 MG tablet Take 1 tablet (40 mg total) by mouth daily. 90 tablet 1   senna (SENOKOT) 8.6 MG TABS tablet Take 8.6 mg by mouth daily as needed for mild constipation.     SYNTHROID 150 MCG tablet TAKE ONE TABLET ON AN EMPTY STOMACH WITHA GLASS OF WATER AT LEAST 30 TO 60 MINUTES BEFORE BREAKFAST 90 tablet 3   trimethoprim (TRIMPEX) 100 MG tablet TAKE ONE TABLET BY MOUTH EVERY DAY 90 tablet 1   TRULICITY 4.5 SL/7.5PY SOPN SMARTSIG:4.5 Milligram(s) SUB-Q Once a Week     UNABLE TO FIND Med Name: Energy, memory and mood enhancer     ZINC CITRATE PO Take 1 tablet by mouth daily.     No current facility-administered medications for this visit.    Past Medical History:  Diagnosis Date   Asthma    Benign neoplasm of colon    Breast cancer ([place]) 08/2018   left breast   Cervical disc disease    "bulging" - no limitations per pt   Chronic diastolic CHF (congestive heart failure) ([place])    a. 07/2012 Echo: Nl EF. mild inferoseptal HK, Gr 2 DD, mild conc LVH, mild PR/TR, mild PAH; b. 08/2019 Echo: EF 55-60%, no rwma, Gr1 DD. Nl RV size/fxn.   CKD (chronic kidney disease), stage III (HCC)    COPD (chronic obstructive pulmonary disease) (HCC)    Coronary artery disease    a. 11/2011 Cath: LAD 66md, LCX min irregs, RCA 70p, 1012mith L->R collats, EF 60%-->Med Rx; b. 03/2019 Cath: LM nl, LAD 40-5043mCX large, mild diff dzs. OM1/2 nl. RCA known to be 100m6m->R collats from LAD & LCX/OM.   Diabetes mellitus without complication (HCC)    Fusion of lumbar spine 12/22/2015   GERD (gastroesophageal reflux disease)    History of DVT (deep vein thrombosis)    History of tobacco abuse    a. Quit 2011.   Hyperlipidemia    Hypertension    Hypothyroidism    Lung cancer (HCC)[place] Obesity    Palpitations    Personal history of radiation therapy    PONV (postoperative nausea and vomiting)    Pulmonary embolus (HCC)[place]/15/2015   RLL. Presented with SOB and  elevated d-dimer.    PVD (peripheral vascular disease) (HCC)[place] a. PTA of right leg and left femoral artery with stenosis; b. 09/2020 ABI: R 0.93, L 0.88 - unchanged from prior study.   [facility]   TIAs - no deficits  Past Surgical History:  Procedure Laterality Date   ANGIOPLASTY / STENTING FEMORAL     PVD; angioplasty right leg and left femoral artery with stenosis.    APPENDECTOMY  12/2014   BACK SURGERY  03/2012   nerve stimulator inserted   BACK SURGERY     screws, rods replaced with new hardware.   BACK SURGERY  11/2016   BREAST LUMPECTOMY Left 08/26/2018   BREAST SURGERY     CARDIAC CATHETERIZATION  12/07/2011   Mid LAD 50%, distal LAD 50%, mid RCA 70%, distal RCA 100%    CARDIAC CATHETERIZATION  01/04/2011   100% occluded mid RCA with good collaterals from distal LAD, normal LVEF.   CATARACT EXTRACTION Right 12/21/2021   CHOLECYSTECTOMY     COLONOSCOPY  2013   COLONOSCOPY WITH PROPOFOL N/A 05/24/2015   Procedure: COLONOSCOPY WITH PROPOFOL;  Surgeon: [name], MD;  Location: [facility];  Service: Endoscopy;  Laterality: N/A;  Diabetic - oral meds   COLONOSCOPY WITH PROPOFOL N/A 01/28/2019   Procedure: COLONOSCOPY WITH PROPOFOL;  Surgeon: [name], MD;  Location: [facility];  Service: Endoscopy;  Laterality: N/A;   DIAGNOSTIC LAPAROSCOPY     ESOPHAGOGASTRODUODENOSCOPY (EGD) WITH PROPOFOL N/A 01/28/2019   Procedure: ESOPHAGOGASTRODUODENOSCOPY (EGD) WITH PROPOFOL;  Surgeon: [name], MD;  Location: [facility];  Service: Endoscopy;  Laterality: N/A;   LAPAROSCOPIC APPENDECTOMY N/A 01/06/2015   Procedure: APPENDECTOMY LAPAROSCOPIC;  Surgeon: [name], MD;  Location: [facility];  Service: General;  Laterality: N/A;   LEFT HEART CATH AND CORONARY ANGIOGRAPHY Left 04/04/2019   Procedure: LEFT HEART CATH AND CORONARY ANGIOGRAPHY;  Surgeon: [name], MD;  Location: [place] [facility];  Service: Cardiovascular;   Laterality: Left;   MASTECTOMY W/ SENTINEL NODE BIOPSY Left 08/26/2018   Procedure: PARTIAL MASTECTOMY WIDE EXCISION WITH SENTINEL LYMPH NODE BIOPSY LEFT;  Surgeon: [name], MD;  Location: [facility];  Service: General;  Laterality: Left;   POLYPECTOMY  05/24/2015   Procedure: POLYPECTOMY;  Surgeon: [name], MD;  Location: [place];  Service: Endoscopy;;   REPLACEMENT TOTAL KNEE Right    RIGHT/LEFT HEART CATH AND CORONARY ANGIOGRAPHY Bilateral 09/26/2021   Procedure: RIGHT/LEFT HEART CATH AND CORONARY ANGIOGRAPHY;  Surgeon: Wellington Hampshire, MD;  Location: [place] [facility];  Service: Cardiovascular;  Laterality: Bilateral;   THYROIDECTOMY     TOTAL KNEE ARTHROPLASTY Right 11/13/2019   TRIGGER FINGER RELEASE       Social History   Tobacco Use   Smoking status: Former    Packs/day: 0.50    Years: 15.00    Total pack years: 7.50    Types: Cigarettes    Quit date: 03/14/2007    Years since quitting: 14.9   Smokeless tobacco: Never  Vaping Use   Vaping Use: Never used  Substance Use Topics   Alcohol use: No    Alcohol/week: 0.0 standard drinks of alcohol   Drug use: Never      Family History  Problem Relation Age of Onset   Cancer Mother        colon   Hypertension Father    Heart disease Father    Heart attack Father    Diabetes Father    Cancer Sister        lung   COPD Sister    COPD Sister    Heart attack Brother    Diabetes Brother    Hypertension Brother    Heart disease Brother  Heart attack Brother    Diabetes Brother    Hypertension Brother    Heart disease Brother    Heart attack Brother    Diabetes Brother    Hypertension Brother    Heart disease Brother    Colon cancer Brother 45   Lung cancer Brother    Breast cancer Neg Hx      Allergies  Allergen Reactions   Hydrocodone Other (See Comments)    Unresponsive   Aleve [Naproxen Sodium]     Hives & itching    Contrast Media [Iodinated Contrast Media]     Pt  avoids due to kidney issues   Ioxaglate Other (See Comments)    (iodine) Pt avoids due to kidney issues    Oxycodone Other (See Comments)    hallucinations   Ranexa [Ranolazine]     Sick on stomach   Sulfa Antibiotics Other (See Comments)    Pt avoids due to kidney issues   Tramadol Other (See Comments)    nightmares Other reaction(s): Hallucination, Other (See Comments) nightmares     REVIEW OF SYSTEMS (Negative unless checked)   Constitutional: _0 Weight loss  _1 Fever  _2 Chills Cardiac: _3 Chest pain   _4 Chest pressure   _5 Palpitations   _6 Shortness of breath when laying flat   _7 Shortness of breath at rest   _8 Shortness of breath with exertion. Vascular:  _9 Pain in legs with walking   _10 Pain in legs at rest   _11 Pain in legs when laying flat   _12 Claudication   _13 Pain in feet when walking  _14 Pain in feet at rest  _15 Pain in feet when laying flat   _16 History of DVT   _17 Phlebitis   _18 Swelling in legs   _19 Varicose veins   _20 Non-healing ulcers Pulmonary:   _21 Uses home oxygen   _22 Productive cough   _23 Hemoptysis   _24 Wheeze  _25 COPD   _26 Asthma Neurologic:  _27 Dizziness  _28 Blackouts   _29 Seizures   _30 History of stroke   _31 History of TIA  _32 Aphasia   _33 Temporary blindness   _34 Dysphagia   _35 Weakness or numbness in arms   _36 Weakness or numbness in legs Musculoskeletal:  _37 Arthritis   _38 Joint swelling   _39 Joint pain   _40 Low back pain Hematologic:  _41 Easy bruising  _42 Easy bleeding   _43 Hypercoagulable state   _44 Anemic   Gastrointestinal:  _45 Blood in stool   _46 Vomiting blood  _47 Gastroesophageal reflux/heartburn   _48 Abdominal pain Genitourinary:  _49 Chronic kidney disease   _50 Difficult urination  _51 Frequent urination  _52 Burning with urination   _53 Hematuria Skin:  _54 Rashes   _55 Ulcers   _56 Wounds Psychological:  _57 History of anxiety   _58  History of major depression.  Physical Examination  BP 111/63 (BP Location: Right Arm)   Pulse 76   Resp 17   Ht _59  (1.676 m)   Wt 209 lb (94.8 kg)   BMI  33.73 kg/m  Gen:  WD/WN, NAD Head: [place]/AT, No temporalis wasting. Ear/Nose/Throat: Hearing grossly intact, nares w/o erythema or drainage Eyes: Conjunctiva clear. Sclera non-icteric Neck: Supple.  Trachea midline Pulmonary:  Good air movement, no use of accessory muscles.  Cardiac: RRR, no JVD Vascular:  Vessel Right Left  Radial Palpable Palpable                          PT 1+ Palpable 1+ Palpable  DP 1+ Palpable 1+ Palpable   Gastrointestinal: soft, non-tender/non-distended. No guarding/reflex.  Musculoskeletal: M/S 5/5 throughout.  No deformity or atrophy. No edema. Neurologic: Sensation grossly intact in extremities.  Symmetrical.  Speech is fluent.  Psychiatric: Judgment intact, Mood & affect appropriate for pt's clinical situation. Dermatologic: No rashes or ulcers noted.  No cellulitis or open wounds.      Labs Recent Results (from the past 2160 hour(s))  CBC and differential     Status: None   Collection Time: 11/23/21 12:00 AM  Result Value Ref Range   Hemoglobin 14.4 12.0 - 16.0   HCT 44 36 - 46   Platelets 353 150 - 400 K/uL   WBC 11.6   VITAMIN D 25 Hydroxy (Vit-D Deficiency, Fractures)     Status: None   Collection Time: 11/23/21 12:00 AM  Result Value Ref Range   Vit D, 25-Hydroxy 45   POCT erythrocyte sed rate, Non-automated     Status: None   Collection Time: 11/23/21 12:00 AM  Result Value Ref Range   Sed Rate 45   Basic metabolic panel     Status: Abnormal   Collection Time: 11/23/21 12:00 AM  Result Value Ref Range   Glucose 114    BUN 24 (A) 4 - 21   CO2 32 (A) 13 - 22   Creatinine 1.7 (A) 0.5 - 1.1   Potassium 5.5 (A) 3.5 - 5.1 mEq/L   Sodium 143 137 - 147   Chloride 103 99 - 108  Comprehensive metabolic panel     Status: None   Collection Time: 11/23/21 12:00 AM  Result Value Ref Range   Globulin 3.2    eGFR 32    Calcium 10.1 8.7 - 10.7   Albumin 4.0 3.5 - 5.0  Lipid panel     Status: Abnormal   Collection Time: 11/23/21 12:00 AM   Result Value Ref Range   Triglycerides 203 (A) 40 - 160   Cholesterol 126 0 - 200   HDL 45 35 - 70   LDL Cholesterol 54   Hepatic function panel     Status: None   Collection Time: 11/23/21 12:00 AM  Result Value Ref Range   Alkaline Phosphatase 76 25 - 125   ALT 10 7 - 35 U/L   AST 15 13 - 35   Bilirubin, Total 0.5   Hemoglobin A1c     Status: None   Collection Time: 11/23/21 12:00 AM  Result Value Ref Range   Hemoglobin A1C 7.5   TSH     Status: None   Collection Time: 11/23/21 12:00 AM  Result Value Ref Range   TSH 0.63 0.41 - 6.83  Basic metabolic panel     Status: Abnormal   Collection Time: 12/06/21  3:44 PM  Result Value Ref Range   Glucose 79 70 - 99 mg/dL   BUN 25 8 - 27 mg/dL   Creatinine, Ser 1.52 (H) 0.57 - 1.00 mg/dL   eGFR 35 (L) >59 mL/min/1.73   BUN/Creatinine Ratio 16 12 - 28   Sodium 141 134 - 144 mmol/L   Potassium 5.1 3.5 - 5.2 mmol/L   Chloride 102 96 - 106 mmol/L   CO2 24 20 - 29 mmol/L   Calcium 9.1 8.7 - 10.3 mg/dL  Basic metabolic panel     Status: Abnormal   Collection Time: 01/10/22  1:42 PM  Result Value Ref Range   Sodium 139 135 - 145 mmol/L   Potassium 4.6 3.5 - 5.1 mmol/L   Chloride 106 98 - 111 mmol/L   CO2 25 22 - 32 mmol/L   Glucose, Bld 121 (H) 70 - 99 mg/dL  Comment: Glucose reference range applies only to samples taken after fasting for at least 8 hours.   BUN 29 (H) 8 - 23 mg/dL   Creatinine, Ser 1.62 (H) 0.44 - 1.00 mg/dL   Calcium 9.3 8.9 - 10.3 mg/dL   GFR, Estimated 32 (L) >60 mL/min    Comment: (NOTE) Calculated using the CKD-EPI Creatinine Equation (2021)    Anion gap 8 5 - 15    Comment: Performed at [facility], [address] [place], [place] [address]  CBC with Differential     Status: Abnormal   Collection Time: 01/10/22  1:42 PM  Result Value Ref Range   WBC 9.8 4.0 - 10.5 K/uL   RBC 4.97 3.87 - 5.11 MIL/uL   Hemoglobin 14.0 12.0 - 15.0 g/dL   HCT 46.5 (H) 36.0 - 46.0 %   MCV 93.6 80.0 - 100.0 fL    MCH 28.2 26.0 - 34.0 pg   MCHC 30.1 30.0 - 36.0 g/dL   RDW 14.6 11.5 - 15.5 %   Platelets 266 150 - 400 K/uL   nRBC 0.0 0.0 - 0.2 %   Neutrophils Relative % 61 %   Neutro Abs 6.0 1.7 - 7.7 K/uL   Lymphocytes Relative 26 %   Lymphs Abs 2.6 0.7 - 4.0 K/uL   Monocytes Relative 9 %   Monocytes Absolute 0.9 0.1 - 1.0 K/uL   Eosinophils Relative 2 %   Eosinophils Absolute 0.2 0.0 - 0.5 K/uL   Basophils Relative 1 %   Basophils Absolute 0.1 0.0 - 0.1 K/uL   Immature Granulocytes 1 %   Abs Immature Granulocytes 0.05 0.00 - 0.07 K/uL    Comment: Performed at [facility], [address] [place], [place] [address]    Radiology No results found.  Assessment/Plan Hyperlipidemia lipid control important in reducing the progression of atherosclerotic disease. Continue statin therapy     Diabetes mellitus type 2 with complications, uncontrolled blood glucose control important in reducing the progression of atherosclerotic disease. Also, involved in wound healing. On appropriate medications.   Essential hypertension blood pressure control important in reducing the progression of atherosclerotic disease. On appropriate oral medications.  Stage 3b chronic kidney disease (HCC) Hydrate and limit contrast with procedures  Atherosclerosis of native arteries of extremity with intermittent claudication ([place]) ABIs today are stable at 0.85 on the right and 0.84 on the left with digit pressures of 71 on the right and 68 on the left at rest.  Her symptoms however seem to be deteriorating.  She describes severe claudication symptoms worse on the right than the left.  At this point, she is very interested in proceeding with revascularization.  She had both legs performed about a decade ago with good results and has had worsening symptoms now over the past year or so.  We discussed that she is not currently in a limb threatening situation but if she is having lifestyle limitation intervention is  reasonable.  She says that she is.  We will plan on performing revascularization of the right lower extremity first and if she has good results proceed with left lower extremity revascularization at another visit a week or 2 later.  I discussed the risks and benefits of the procedure.  Patient and her husband are agreeable to proceed.    Leotis Pain, MD  02/14/2022 3:17 PM    This note was created with Dragon medical transcription system.  Any errors from dictation are purely unintentional

## 2022-02-16 ENCOUNTER — Ambulatory Visit: Payer: Self-pay

## 2022-02-16 ENCOUNTER — Encounter: Payer: Self-pay | Admitting: Family Medicine

## 2022-02-16 ENCOUNTER — Other Ambulatory Visit: Payer: Self-pay | Admitting: Family Medicine

## 2022-02-16 ENCOUNTER — Ambulatory Visit (INDEPENDENT_AMBULATORY_CARE_PROVIDER_SITE_OTHER): Payer: Medicare Other | Admitting: Family Medicine

## 2022-02-16 VITALS — BP 111/77 | HR 80 | Temp 98.1°F | Ht 66.0 in | Wt 209.0 lb

## 2022-02-16 DIAGNOSIS — R062 Wheezing: Secondary | ICD-10-CM

## 2022-02-16 DIAGNOSIS — I200 Unstable angina: Secondary | ICD-10-CM

## 2022-02-16 MED ORDER — TRIAMCINOLONE ACETONIDE 40 MG/ML IJ SUSP
40.0000 mg | Freq: Once | INTRAMUSCULAR | Status: AC
  Administered 2022-02-16: 40 mg via INTRAMUSCULAR

## 2022-02-16 MED ORDER — PREDNISONE 10 MG PO TABS: ORAL_TABLET | ORAL | 0 refills | Status: DC

## 2022-02-16 MED ORDER — BENZONATATE 200 MG PO CAPS: 200.0000 mg | ORAL_CAPSULE | Freq: Two times a day (BID) | ORAL | 0 refills | Status: DC | PRN

## 2022-02-16 NOTE — Telephone Encounter (Signed)
Requested medications are due for refill today.  unsure  Requested medications are on the active medications list.  yes  Last refill. 05/12/2021   Future visit scheduled.   yes  Notes to clinic.  Medication is listed as historical.    Requested Prescriptions  Pending Prescriptions Disp Refills   TRULICITY 4.5 EV/0.3JK SOPN [Pharmacy Med Name: TRULICITY 4.5 KX/3.8HW SUBQ SOLN ML] 6 mL     Sig: INJECT 4.5 MG INTO THE SKIN ONCE A WEEK     Endocrinology:  Diabetes - GLP-1 Receptor Agonists Passed - 02/16/2022  4:42 PM      Passed - HBA1C is between 0 and 7.9 and within 180 days    Hemoglobin A1C  Date Value Ref Range Status  11/23/2021 7.5  Final         Passed - Valid encounter within last 6 months    Recent Outpatient Visits           Today [place], Megan P, DO   1 month ago Chest pain, unspecified type   [place], Megan P, DO   2 months ago Hyperkalemia   [place], Megan P, DO   3 months ago Upper respiratory tract infection, unspecified type   [place], Megan P, DO   4 months ago Intermittent left-sided chest pain   [facility] [place], Barb Merino, DO       Future Appointments             In 1 week Wynetta Emery, Barb Merino, DO [facility]

## 2022-02-16 NOTE — Progress Notes (Signed)
BP 111/77   Pulse 80   Temp 98.1 F (36.7 C) (Oral)   Ht 5\' 6"  (1.676 m)   Wt 209 lb (94.8 kg)   SpO2 94%   BMI 33.73 kg/m    Subjective:    Patient ID: Tammy [name], female    DOB: 1945-09-11, 76 y.o.   MRN: [id number]  HPI: Tammy [name] is a 76 y.o. female  Chief Complaint  Patient presents with   Shortness of Breath   Cough   Wheezing    Patient says she has been coughing and wheezing since the beginning of the week, which is causing her to be short of breath. Patient says she also noticed some runny nose and watery eyes. Patient has been taking Allegra Allergy.   Nasal Congestion   Breast Pain   UPPER RESPIRATORY TRACT INFECTION Duration: 3-4 days Worst symptom: SOB, wheezing Fever: no Cough: yes Shortness of breath: yes Wheezing: yes Chest pain: yes, with cough Chest tightness: yes Chest congestion: no Nasal congestion: yes Runny nose: yes Post nasal drip: yes Sneezing: yes Sore throat: yes Swollen glands: no Sinus pressure: no Headache: yes Face pain: no Toothache: no Ear pain: no  Ear pressure: no  Eyes red/itching:yes Eye drainage/crusting: no  Vomiting: yes Rash: no Fatigue: yes Sick contacts: no Strep contacts: no  Context: worse Recurrent sinusitis: no Relief with OTC cold/cough medications: no  Treatments attempted: inhalers, allegra   Relevant past medical, surgical, family and social history reviewed and updated as indicated. Interim medical history since our last visit reviewed. Allergies and medications reviewed and updated.  Review of Systems  Constitutional:  Positive for fatigue. Negative for activity change, appetite change, chills, diaphoresis, fever and unexpected weight change.  HENT:  Positive for congestion, postnasal drip, rhinorrhea and sneezing. Negative for dental problem, drooling, ear discharge, ear pain, facial swelling, hearing loss, mouth sores, nosebleeds, sinus pressure, sinus pain, sore throat, tinnitus, trouble  swallowing and voice change.   Eyes: Negative.   Respiratory:  Positive for cough, chest tightness, shortness of breath and wheezing. Negative for apnea, choking and stridor.   Cardiovascular: Negative.   Gastrointestinal: Negative.   Genitourinary: Negative.   Musculoskeletal: Negative.   Neurological: Negative.   Psychiatric/Behavioral: Negative.      Per HPI unless specifically indicated above     Objective:    BP 111/77   Pulse 80   Temp 98.1 F (36.7 C) (Oral)   Ht 5\' 6"  (1.676 m)   Wt 209 lb (94.8 kg)   SpO2 94%   BMI 33.73 kg/m   Wt Readings from Last 3 Encounters:  02/16/22 209 lb (94.8 kg)  02/14/22 209 lb (94.8 kg)  01/10/22 209 lb 1.6 oz (94.8 kg)    Physical Exam Vitals and nursing note reviewed.  Constitutional:      General: She is not in acute distress.    Appearance: Normal appearance. She is well-developed. She is obese. She is not ill-appearing, toxic-appearing or diaphoretic.  HENT:     Head: Normocephalic and atraumatic.     Right Ear: External ear normal.     Left Ear: External ear normal.     Nose: Nose normal.     Mouth/Throat:     Mouth: Mucous membranes are moist.     Pharynx: Oropharynx is clear.  Eyes:     General: No scleral icterus.       Right eye: No discharge.        Left eye: No  discharge.     Extraocular Movements: Extraocular movements intact.     Conjunctiva/sclera: Conjunctivae normal.     Pupils: Pupils are equal, round, and reactive to light.  Cardiovascular:     Rate and Rhythm: Normal rate and regular rhythm.     Pulses: Normal pulses.     Heart sounds: Normal heart sounds. No murmur heard.    No friction rub. No gallop.  Pulmonary:     Effort: Pulmonary effort is normal. No respiratory distress.     Breath sounds: No stridor. Wheezing (throughout) present. No rhonchi or rales.  Chest:     Chest wall: No tenderness.  Musculoskeletal:        General: Normal range of motion.     Cervical back: Normal range of motion  and neck supple.  Skin:    General: Skin is warm and dry.     Capillary Refill: Capillary refill takes less than 2 seconds.     Coloration: Skin is not jaundiced or pale.     Findings: No bruising, erythema, lesion or rash.  Neurological:     General: No focal deficit present.     Mental Status: She is alert and oriented to person, place, and time. Mental status is at baseline.  Psychiatric:        Mood and Affect: Mood normal.        Behavior: Behavior normal.        Thought Content: Thought content normal.        Judgment: Judgment normal.     Results for orders placed or performed in visit on 02/16/22  Rapid Strep Screen ([facility] ONLY)   Specimen: Other   Other  Result Value Ref Range   Strep Gp A Ag, IA W/Reflex Negative Negative  Culture, Group A Strep   Other  Result Value Ref Range   Strep A Culture WILL FOLLOW   Veritor Flu A/B Waived  Result Value Ref Range   Influenza A Negative Negative   Influenza B Negative Negative   *Note: Due to a large number of results and/or encounters for the requested time period, some results have not been displayed. A complete set of results can be found in Results Review.      Assessment & Plan:   Problem List Items Addressed This Visit   None Visit Diagnoses     Wheezing    -  Primary   Flu and Strep negative. Await COVID. Will treat with prednisone and tessalon. Call with any concerns or if not getting better. Recheck 2 weeks.   Relevant Medications   triamcinolone acetonide (KENALOG-40) injection 40 mg (Completed)   Other Relevant Orders   Novel Coronavirus, NAA ([facility])   Rapid Strep Screen ([facility] ONLY) (Completed)   Veritor Flu A/B Waived (Completed)        Follow up plan: Return in about 2 weeks (around 03/02/2022).

## 2022-02-16 NOTE — Telephone Encounter (Signed)
  Chief Complaint: SOB Symptoms: SOB and wheezing, worse than normal, constant, runny nose and cough Frequency: started this week but unsure what day Pertinent Negatives: NA Disposition: [] ED /[] Urgent Care (no appt availability in office) / [] Appointment(In office/virtual)/ []  [facility]/ [] Home Care/ [] Refused Recommended Disposition /[] [facility]/ []  Follow-up with PCP Additional Notes: pt states she has been having SOB even with rest and only gets small relief from inhalers. Has been wheezing too. No appts open for today so called and spoke with Ubaldo Glassing, FC. She states she messaged Dr. Wynetta Emery and waiting on response to let pt know she would call her back. Informed pt of this and advised if she doesn't hear back by 1200 to call NT back and we can schedule UC appt. Pt verbalized understanding.   Reason for Disposition  [1] MILD difficulty breathing (e.g., minimal/no SOB at rest, SOB with walking, pulse <100) AND [2] NEW-onset or WORSE than normal  Answer Assessment - Initial Assessment Questions 1. RESPIRATORY STATUS: "Describe your breathing?" (e.g., wheezing, shortness of breath, unable to speak, severe coughing)      SOB and wheezing  2. ONSET: "When did this breathing problem begin?"      Sometime this week  3. PATTERN "Does the difficult breathing come and go, or has it been constant since it started?"      Constant  4. SEVERITY: "How bad is your breathing?" (e.g., mild, moderate, severe)    - MILD: No SOB at rest, mild SOB with walking, speaks normally in sentences, can lie down, no retractions, pulse < 100.    - MODERATE: SOB at rest, SOB with minimal exertion and prefers to sit, cannot lie down flat, speaks in phrases, mild retractions, audible wheezing, pulse 100-120.    - SEVERE: Very SOB at rest, speaks in single words, struggling to breathe, sitting hunched forward, retractions, pulse > 120      Moderate  7. LUNG HISTORY: "Do you have any history of lung  disease?"  (e.g., pulmonary embolus, asthma, emphysema)     asthma 9. OTHER SYMPTOMS: "Do you have any other symptoms? (e.g., dizziness, runny nose, cough, chest pain, fever)      Runny nose and cough  Protocols used: Breathing Difficulty-A-AH

## 2022-02-17 LAB — NOVEL CORONAVIRUS, NAA: SARS-CoV-2, NAA: NOT DETECTED

## 2022-02-18 LAB — VERITOR FLU A/B WAIVED
Influenza A: NEGATIVE
Influenza B: NEGATIVE

## 2022-02-18 LAB — CULTURE, GROUP A STREP: Strep A Culture: NEGATIVE

## 2022-02-18 LAB — RAPID STREP SCREEN (MED CTR MEBANE ONLY): Strep Gp A Ag, IA W/Reflex: NEGATIVE

## 2022-02-24 ENCOUNTER — Emergency Department
Admission: EM | Admit: 2022-02-24 | Discharge: 2022-02-25 | Disposition: A | Discharge status: Home / Self Care | Payer: Medicare Other | Attending: Emergency Medicine | Admitting: Emergency Medicine

## 2022-02-24 ENCOUNTER — Emergency Department: Payer: Medicare Other

## 2022-02-24 ENCOUNTER — Ambulatory Visit: Payer: Self-pay

## 2022-02-24 ENCOUNTER — Other Ambulatory Visit: Payer: Self-pay

## 2022-02-24 ENCOUNTER — Ambulatory Visit: Payer: Medicare Other | Admitting: Family Medicine

## 2022-02-24 DIAGNOSIS — N390 Urinary tract infection, site not specified: Secondary | ICD-10-CM | POA: Diagnosis not present

## 2022-02-24 DIAGNOSIS — I10 Essential (primary) hypertension: Secondary | ICD-10-CM | POA: Insufficient documentation

## 2022-02-24 DIAGNOSIS — Z86711 Personal history of pulmonary embolism: Secondary | ICD-10-CM | POA: Insufficient documentation

## 2022-02-24 DIAGNOSIS — Z1152 Encounter for screening for COVID-19: Secondary | ICD-10-CM | POA: Insufficient documentation

## 2022-02-24 DIAGNOSIS — J45909 Unspecified asthma, uncomplicated: Secondary | ICD-10-CM | POA: Insufficient documentation

## 2022-02-24 DIAGNOSIS — R000 Tachycardia, unspecified: Secondary | ICD-10-CM | POA: Diagnosis not present

## 2022-02-24 DIAGNOSIS — R531 Weakness: Secondary | ICD-10-CM | POA: Diagnosis not present

## 2022-02-24 DIAGNOSIS — Z8673 Personal history of transient ischemic attack (TIA), and cerebral infarction without residual deficits: Secondary | ICD-10-CM | POA: Insufficient documentation

## 2022-02-24 DIAGNOSIS — B974 Respiratory syncytial virus as the cause of diseases classified elsewhere: Secondary | ICD-10-CM | POA: Diagnosis not present

## 2022-02-24 DIAGNOSIS — R062 Wheezing: Secondary | ICD-10-CM | POA: Diagnosis not present

## 2022-02-24 DIAGNOSIS — Z8701 Personal history of pneumonia (recurrent): Secondary | ICD-10-CM | POA: Insufficient documentation

## 2022-02-24 DIAGNOSIS — J111 Influenza due to unidentified influenza virus with other respiratory manifestations: Secondary | ICD-10-CM | POA: Diagnosis not present

## 2022-02-24 DIAGNOSIS — J101 Influenza due to other identified influenza virus with other respiratory manifestations: Secondary | ICD-10-CM | POA: Diagnosis not present

## 2022-02-24 DIAGNOSIS — E119 Type 2 diabetes mellitus without complications: Secondary | ICD-10-CM | POA: Insufficient documentation

## 2022-02-24 DIAGNOSIS — R0689 Other abnormalities of breathing: Secondary | ICD-10-CM | POA: Diagnosis not present

## 2022-02-24 DIAGNOSIS — J683 Other acute and subacute respiratory conditions due to chemicals, gases, fumes and vapors: Secondary | ICD-10-CM

## 2022-02-24 DIAGNOSIS — E039 Hypothyroidism, unspecified: Secondary | ICD-10-CM | POA: Insufficient documentation

## 2022-02-24 DIAGNOSIS — J9811 Atelectasis: Secondary | ICD-10-CM | POA: Diagnosis not present

## 2022-02-24 LAB — URINALYSIS, ROUTINE W REFLEX MICROSCOPIC
Bilirubin Urine: NEGATIVE
Glucose, UA: >=500 mg/dL — AB
Ketones, ur: 5 mg/dL — AB
Nitrite: POSITIVE — AB
Protein, ur: 100 mg/dL — AB
Specific Gravity, Urine: 1.025 (ref 1.005–1.030)
WBC, UA: >50 WBC/hpf — ABNORMAL HIGH (ref 0–5)
pH: 5 (ref 5.0–8.0)

## 2022-02-24 LAB — RESP PANEL BY RT-PCR (RSV, FLU A&B, COVID)  RVPGX2
Influenza A by PCR: POSITIVE — AB
Influenza B by PCR: NEGATIVE
Resp Syncytial Virus by PCR: NEGATIVE
SARS Coronavirus 2 by RT PCR: NEGATIVE

## 2022-02-24 LAB — BASIC METABOLIC PANEL
Anion gap: 11 (ref 5–15)
BUN: 25 mg/dL — ABNORMAL HIGH (ref 8–23)
CO2: 22 mmol/L (ref 22–32)
Calcium: 8.6 mg/dL — ABNORMAL LOW (ref 8.9–10.3)
Chloride: 104 mmol/L (ref 98–111)
Creatinine, Ser: 1.58 mg/dL — ABNORMAL HIGH (ref 0.44–1.00)
GFR, Estimated: 33 mL/min — ABNORMAL LOW (ref >60)
Glucose, Bld: 117 mg/dL — ABNORMAL HIGH (ref 70–99)
Potassium: 4.5 mmol/L (ref 3.5–5.1)
Sodium: 137 mmol/L (ref 135–145)

## 2022-02-24 LAB — CBC WITH DIFFERENTIAL/PLATELET
Abs Immature Granulocytes: 0.14 10*3/uL — ABNORMAL HIGH (ref 0.00–0.07)
Basophils Absolute: 0.1 10*3/uL (ref 0.0–0.1)
Basophils Relative: 0 %
Eosinophils Absolute: 0 10*3/uL (ref 0.0–0.5)
Eosinophils Relative: 0 %
HCT: 49.7 % — ABNORMAL HIGH (ref 36.0–46.0)
Hemoglobin: 15.1 g/dL — ABNORMAL HIGH (ref 12.0–15.0)
Immature Granulocytes: 1 %
Lymphocytes Relative: 7 %
Lymphs Abs: 0.8 10*3/uL (ref 0.7–4.0)
MCH: 28 pg (ref 26.0–34.0)
MCHC: 30.4 g/dL (ref 30.0–36.0)
MCV: 92 fL (ref 80.0–100.0)
Monocytes Absolute: 0.5 10*3/uL (ref 0.1–1.0)
Monocytes Relative: 4 %
Neutro Abs: 10.8 10*3/uL — ABNORMAL HIGH (ref 1.7–7.7)
Neutrophils Relative %: 88 %
Platelets: 208 10*3/uL (ref 150–400)
RBC: 5.4 MIL/uL — ABNORMAL HIGH (ref 3.87–5.11)
RDW: 14.6 % (ref 11.5–15.5)
WBC: 12.4 10*3/uL — ABNORMAL HIGH (ref 4.0–10.5)
nRBC: 0 % (ref 0.0–0.2)

## 2022-02-24 LAB — CBC
HCT: 48.2 % — ABNORMAL HIGH (ref 36.0–46.0)
Hemoglobin: 14.5 g/dL (ref 12.0–15.0)
MCH: 28 pg (ref 26.0–34.0)
MCHC: 30.1 g/dL (ref 30.0–36.0)
MCV: 93.2 fL (ref 80.0–100.0)
Platelets: 223 10*3/uL (ref 150–400)
RBC: 5.17 MIL/uL — ABNORMAL HIGH (ref 3.87–5.11)
RDW: 14.6 % (ref 11.5–15.5)
WBC: 12.2 10*3/uL — ABNORMAL HIGH (ref 4.0–10.5)
nRBC: 0 % (ref 0.0–0.2)

## 2022-02-24 LAB — LACTIC ACID, PLASMA: Lactic Acid, Venous: 1.2 mmol/L (ref 0.5–1.9)

## 2022-02-24 LAB — TROPONIN I (HIGH SENSITIVITY): Troponin I (High Sensitivity): 11 ng/L (ref <18)

## 2022-02-24 LAB — PROCALCITONIN: Procalcitonin: 0.1 ng/mL

## 2022-02-24 MED ORDER — OSELTAMIVIR PHOSPHATE 30 MG PO CAPS
30.0000 mg | ORAL_CAPSULE | Freq: Once | ORAL | Status: AC
  Administered 2022-02-24: 30 mg via ORAL
  Filled 2022-02-24: qty 1

## 2022-02-24 MED ORDER — CEPHALEXIN 500 MG PO CAPS
500.0000 mg | ORAL_CAPSULE | Freq: Once | ORAL | Status: AC
  Administered 2022-02-25: 500 mg via ORAL
  Filled 2022-02-24: qty 1

## 2022-02-24 MED ORDER — CEPHALEXIN 500 MG PO CAPS: 500.0000 mg | ORAL_CAPSULE | Freq: Three times a day (TID) | ORAL | 0 refills | Status: AC

## 2022-02-24 MED ORDER — IPRATROPIUM-ALBUTEROL 0.5-2.5 (3) MG/3ML IN SOLN
3.0000 mL | Freq: Once | RESPIRATORY_TRACT | Status: AC
  Administered 2022-02-24: 3 mL via RESPIRATORY_TRACT
  Filled 2022-02-24: qty 3

## 2022-02-24 MED ORDER — OSELTAMIVIR PHOSPHATE 30 MG PO CAPS: 30.0000 mg | ORAL_CAPSULE | Freq: Two times a day (BID) | ORAL | 0 refills | Status: DC

## 2022-02-24 NOTE — ED Triage Notes (Signed)
DM type II CA patient here today for complaints of weakness; Upon EMS arrival, patient was tachypenic with RA sats of 88%, was given an Albuterol treatment which improved her sats to 95% on RA, however her RA sats upon arrival here are 89%; Was also given Solu-Medrol 125mg  IV; Was diagnosed with URI last week and finished rx for prednisone yesterday

## 2022-02-24 NOTE — Discharge Instructions (Addendum)
Please take the Tamiflu 1 twice a day.  This should help treat the flu.  Use your inhaler up to every 4 hours if need be.  If it is not working to get sicker or short of breath or get a higher fever or feel weaker or have any other problems please do not hesitate to return.  Take the Keflex antibiotic 1 3 times a day for UTI.  Call the ambulance if you need to.  Please follow-up with your regular doctor in 3 to 4 days.

## 2022-02-24 NOTE — Telephone Encounter (Signed)
    Chief Complaint: Vomited x 1 today, cough, Wheezing Symptoms: Above Frequency: Yesterday Pertinent Negatives: Patient denies fever Disposition: [] ED /[] Urgent Care (no appt availability in office) / [] Appointment(In office/virtual)/ []  [facility]/ [] Home Care/ [] Refused Recommended Disposition /[] [facility]/ [x]  Follow-up with PCP Additional Notes: Pt. Asking for Prednisone for cough and wheezing. Please advise. Refuses UC.  Answer Assessment - Initial Assessment Questions 1. VOMITING SEVERITY: "How many times have you vomited in the past 24 hours?"     - MILD:  1 - 2 times/day    - MODERATE: 3 - 5 times/day, decreased oral intake without significant weight loss or symptoms of dehydration    - SEVERE: 6 or more times/day, vomits everything or nearly everything, with significant weight loss, symptoms of dehydration      1 2. ONSET: "When did the vomiting begin?"      Today 3. FLUIDS: "What fluids or food have you vomited up today?" "Have you been able to keep any fluids down?"     Keeping fluids down 4. ABDOMEN PAIN: "Are your having any abdomen pain?" If Yes : "How bad is it and what does it feel like?" (e.g., crampy, dull, intermittent, constant)      No 5. DIARRHEA: "Is there any diarrhea?" If Yes, ask: "How many times today?"      No 6. CONTACTS: "Is there anyone else in the family with the same symptoms?"      Husband 7. CAUSE: "What do you think is causing your vomiting?"     Unsure 8. HYDRATION STATUS: "Any signs of dehydration?" (e.g., dry mouth [not only dry lips], too weak to stand) "When did you last urinate?"     No 9. OTHER SYMPTOMS: "Do you have any other symptoms?" (e.g., fever, headache, vertigo, vomiting blood or coffee grounds, recent head injury)     Headache, cough 10. PREGNANCY: "Is there any chance you are pregnant?" "When was your last menstrual period?"       No  Protocols used: Vomiting-A-AH

## 2022-02-24 NOTE — ED Notes (Signed)
Pt ambulated independently with walker around the stretcher, to and from the bathroom, and conversed with this RN SpO2 sustained 92-93% with respirations unlabored. MD updated

## 2022-02-24 NOTE — ED Provider Notes (Signed)
[facility] Provider Note    Event Date/Time   First MD Initiated Contact with Patient 02/24/22 2008     (approximate)   History   Weakness (DM type II CA patient here today for complaints of weakness; Upon EMS arrival, patient was tachypenic with RA sats of 88%, was given an Albuterol treatment which improved her sats to 95% on RA, however her RA sats upon arrival here are 89%; Was also given Solu-Medrol 125mg  IV; Was diagnosed with URI last week and finished rx for prednisone yesterday)   HPI  Tammy [name] is a 76 y.o. female in by EMS with the above history.  She does not remember getting Solu-Medrol or the albuterol.  She does have a history of asthma, reflux, hypothyroidism, smoking, hyperlipidemia, hypertension, obesity, DVT, diabetes, stroke, PE, breast and lung cancer      Physical Exam   Triage Vital Signs: ED Triage Vitals  Enc Vitals Group     BP 02/24/22 1920 130/70     Pulse Rate 02/24/22 1920 92     Resp 02/24/22 1920 (!) 21     Temp 02/24/22 1920 98.8 F (37.1 C)     Temp Source 02/24/22 1920 Oral     SpO2 02/24/22 1920 (!) 89 %     Weight 02/24/22 1910 208 lb 15.9 oz (94.8 kg)     Height 02/24/22 1910 5\' 6"  (1.676 m)     Head Circumference --      Peak Flow --      Pain Score --      Pain Loc --      Pain Edu? --      Excl. in [place]? --     Most recent vital signs: Vitals:   02/24/22 2245 02/24/22 2312  BP:    Pulse: (!) 106 (!) 104  Resp: (!) 24 (!) 23  Temp:    SpO2: 91% 94%    General: Awake, no distress.  CV:  Good peripheral perfusion.  Heart regular rate and rhythm no audible murmurs Resp:  Normal effort.  Diffuse wheezes Abd:  No distention.  Soft nontender Remedies: No edema   ED Results / Procedures / Treatments   Labs (all labs ordered are listed, but only abnormal results are displayed) Labs Reviewed  RESP PANEL BY RT-PCR (RSV, FLU A&B, COVID)  RVPGX2 - Abnormal; Notable for the following components:       Result Value   Influenza A by PCR POSITIVE (*)    All other components within normal limits  BASIC METABOLIC PANEL - Abnormal; Notable for the following components:   Glucose, Bld 117 (*)    BUN 25 (*)    Creatinine, Ser 1.58 (*)    Calcium 8.6 (*)    GFR, Estimated 33 (*)    All other components within normal limits  CBC - Abnormal; Notable for the following components:   WBC 12.2 (*)    RBC 5.17 (*)    HCT 48.2 (*)    All other components within normal limits  CBC WITH DIFFERENTIAL/PLATELET - Abnormal; Notable for the following components:   WBC 12.4 (*)    RBC 5.40 (*)    Hemoglobin 15.1 (*)    HCT 49.7 (*)    Neutro Abs 10.8 (*)    Abs Immature Granulocytes 0.14 (*)    All other components within normal limits  LACTIC ACID, PLASMA  PROCALCITONIN  URINALYSIS, ROUTINE W REFLEX MICROSCOPIC  TROPONIN I (HIGH SENSITIVITY)  EKG  EKG read interpreted by me shows normal sinus rhythm rate of 90 left axis I PVC no acute ST-T changes   RADIOLOGY Chest x-ray read by radiology reviewed interpreted by me does not show any obvious acute infiltrates.   PROCEDURES:  Critical Care performed:   Procedures   MEDICATIONS ORDERED IN ED: Medications  ipratropium-albuterol (DUONEB) 0.5-2.5 (3) MG/3ML nebulizer solution 3 mL (3 mLs Nebulization Given 02/24/22 2041)  ipratropium-albuterol (DUONEB) 0.5-2.5 (3) MG/3ML nebulizer solution 3 mL (3 mLs Nebulization Given 02/24/22 2017)     IMPRESSION / MDM / ASSESSMENT AND PLAN / ED COURSE  I reviewed the triage vital signs and the nursing notes. ----------------------------------------- 11:13 PM on 02/24/2022 ----------------------------------------- Patient with influenza.  Patient is able to ambulate and remains at 93%.  She feels like she wants to go home and feels much better.  Differential diagnosis includes, but is not limited to,   Patient's presentation is most consistent with acute presentation with potential threat to  life or bodily function.  The patient is on the cardiac monitor to evaluate for evidence of arrhythmia and/or significant heart rate changes.  Only sinus tachycardia has been seen   FINAL CLINICAL IMPRESSION(S) / ED DIAGNOSES   Final diagnoses:  Influenza  Reactive airways dysfunction syndrome ([place])     Rx / DC Orders   ED Discharge Orders          Ordered    oseltamivir (TAMIFLU) 30 MG capsule  2 times daily        02/24/22 2316             Note:  This document was prepared using Dragon voice recognition software and may include unintentional dictation errors.   Nena Polio, MD 02/24/22 [phone]

## 2022-02-25 DIAGNOSIS — J111 Influenza due to unidentified influenza virus with other respiratory manifestations: Secondary | ICD-10-CM | POA: Diagnosis not present

## 2022-02-25 NOTE — ED Notes (Signed)
Discharge instructions provided by EDP were discussed with pt. Pt verbalized understanding with no additional questions at this time. Pt to go home with family at bedside.

## 2022-02-27 ENCOUNTER — Ambulatory Visit: Payer: Medicare Other | Admitting: Family Medicine

## 2022-02-27 NOTE — Telephone Encounter (Signed)
Pt scheduled today

## 2022-02-28 ENCOUNTER — Telehealth: Payer: Self-pay | Admitting: *Deleted

## 2022-02-28 NOTE — Patient Outreach (Signed)
  Care Coordination [facility] Note Transition Care Management Follow-up Telephone Call Date of discharge and from where: [facility] ED [phone] How have you been since you were released from the hospital? I am feeling a little better Any questions or concerns? No  Items Reviewed: Did the pt receive and understand the discharge instructions provided? Yes  Medications obtained and verified? Yes  Other? Yes  Any new allergies since your discharge? No  Dietary orders reviewed? No Do you have support at home? Yes   Home Care and Equipment/Supplies: Were home health services ordered? no If so, what is the name of the agency? N/a  Has the agency set up a time to come to the patient's home? not applicable Were any new equipment or medical supplies ordered?  No What is the name of the medical supply agency? N/a Were you able to get the supplies/equipment? not applicable Do you have any questions related to the use of the equipment or supplies? No  Functional Questionnaire: (I = Independent and D = Dependent) ADLs: I  Bathing/Dressing- I  Meal Prep- I  Eating- I  Maintaining continence- I  Transferring/Ambulation- I  Managing Meds- I  Follow up appointments reviewed:  PCP Hospital f/u appt confirmed? Yes  [facility] [phone] 1:00 Specialist [facility] f/u appt confirmed? No   Are transportation arrangements needed? No  If their condition worsens, is the pt aware to call PCP or go to the Emergency Dept.? Yes Was the patient provided with contact information for the PCP's office or ED? Yes Was to pt encouraged to call back with questions or concerns? Yes  SDOH assessments and interventions completed:   Yes SDOH Interventions Today    Flowsheet Row Most Recent Value  SDOH Interventions   Food Insecurity Interventions Intervention Not Indicated  Housing Interventions Intervention Not Indicated  Transportation Interventions Intervention Not Indicated       Care Coordination  Interventions:  Patient is being followed by Chronic care coordination . RN discussed with patient about foods to help boost her immune system.    Encounter Outcome:  Pt. Visit Completed    [facility] [phone]

## 2022-03-01 ENCOUNTER — Telehealth: Payer: Medicare Other

## 2022-03-01 ENCOUNTER — Ambulatory Visit (INDEPENDENT_AMBULATORY_CARE_PROVIDER_SITE_OTHER): Payer: Medicare Other

## 2022-03-01 DIAGNOSIS — I25118 Atherosclerotic heart disease of native coronary artery with other forms of angina pectoris: Secondary | ICD-10-CM

## 2022-03-01 DIAGNOSIS — E1165 Type 2 diabetes mellitus with hyperglycemia: Secondary | ICD-10-CM

## 2022-03-01 DIAGNOSIS — Z9181 History of falling: Secondary | ICD-10-CM

## 2022-03-01 DIAGNOSIS — Z170 Estrogen receptor positive status [ER+]: Secondary | ICD-10-CM

## 2022-03-01 NOTE — Patient Instructions (Signed)
Please call the care guide team at [phone] if you need to cancel or reschedule your appointment.   If you are experiencing a Mental Health or Behavioral Health Crisis or need someone to talk to, please call the Suicide and Crisis Lifeline: 988 call the [place] National Suicide Prevention Lifeline: 1-800-273-8255 or TTY: 1-800-799-4 TTY (1-800-799-4889) to talk to a trained counselor call 1-800-273-TALK (toll free, 24 hour hotline)   Following is a copy of the CCM Program Consent:  CCM service includes personalized support from designated clinical staff supervised by the physician, including individualized plan of care and coordination with other care providers 24/7 contact phone numbers for assistance for urgent and routine care needs. Service will only be billed when office clinical staff spend 20 minutes or more in a month to coordinate care. Only one practitioner may furnish and bill the service in a calendar month. The patient may stop CCM services at amy time (effective at the end of the month) by phone call to the office staff. The patient will be responsible for cost sharing (co-pay) or up to 20% of the service fee (after annual deductible is met)  Following is a copy of your full provider care plan:   Goals Addressed             This Visit's Progress    CCM (DIABETES) EXPECTED OUTCOME: MONITOR, SELF-MANAGE AND REDUCE SYMPTOMS OF DIABETES       Current Barriers:  Knowledge Deficits related to how to effectively manage and maintain DM and goal of A1C <8% Chronic Disease Management support and education needs related to effective management DM Goal set by the provider <8.1% Lab Results  Component Value Date   HGBA1C 7.5 11/23/2021     Planned Interventions: Provided education to patient about basic DM disease process; Reviewed medications with patient and discussed importance of medication adherence. The patient is compliant with medications. Education and support given;         Reviewed prescribed diet with patient heart healthy/ADA diet. The patient currently has the flu. States that she does not have an appetite. Education on staying hydrated and making sure she has plenty of liquids. The patient is feeling some better. Education and support given ; Counseled on importance of regular laboratory monitoring as prescribed;        Discussed plans with patient for ongoing care management follow up and provided patient with direct contact information for care management team;      Provided patient with written educational materials related to hypo and hyperglycemia and importance of correct treatment. Review of how to effectively manage drops in blood sugars or highs. The patient states the Freestyle Libre is really helpful and alerts her when her blood sugars is high or low;       Reviewed scheduled/upcoming provider appointments including: 03-16-2022 at 1 pm;         Advised patient, providing education and rationale, to check cbg when you have symptoms of low or high blood sugar and has freestyle libre  and record. The patient did not provide numbers today. The patient was not feeling the best. Education provided. Will continue to monitor.      call provider for findings outside established parameters;       Review of patient status, including review of consultants reports, relevant laboratory and other test results, and medications completed;       Advised patient to discuss changes in DM and diabetes health with provider;        Screening for signs and symptoms of depression related to chronic disease state;        Assessed social determinant of health barriers;         Symptom Management: Take medications as prescribed   Attend all scheduled provider appointments Call pharmacy for medication refills 3-7 days in advance of running out of medications Call provider office for new concerns or questions  call the Suicide and Crisis Lifeline: 988 call the [place] National Suicide  Prevention Lifeline: 1-800-273-8255 or TTY: 1-800-799-4 TTY (1-800-799-4889) to talk to a trained counselor call 1-800-273-TALK (toll free, 24 hour hotline) if experiencing a Mental Health or Behavioral Health Crisis  check feet daily for cuts, sores or redness trim toenails straight across manage portion size wash and dry feet carefully every day wear comfortable, cotton socks wear comfortable, well-fitting shoes  Follow Up Plan: Telephone follow up appointment with care management team member scheduled for: 05-02-2022 at 1030 am       CCM Expected Outcome:  Monitor, Self-Manage and Reduce Symptoms of  breast cancer       Current Barriers:  Chronic Disease Management support and education needs related to effective management of breast cancer  Planned Interventions: Assessed patient understanding of cancer diagnosis and recommended treatment plan Reviewed upcoming provider appointments and treatment appointments. Has appointment with the pcp for follow up on 03-16-2022 at 1 pm Assessed available transportation to appointments and treatments. Has consistent/reliable transportation: Yes Assessed support system. Has consistent/reliable family or other support: Yes Nutrition assessment performed The patient saw the oncologist yesterday and current treatment plan with Arimidex is effective. The patient states there were  no changes in the plan of care for her breast cancer treatment plan. Follows up accordingly with the oncologist  Symptom Management: Take medications as prescribed   Attend all scheduled provider appointments Call pharmacy for medication refills 3-7 days in advance of running out of medications Call provider office for new concerns or questions  call the Suicide and Crisis Lifeline: 988 call the [place] National Suicide Prevention Lifeline: 1-800-273-8255 or TTY: 1-800-799-4 TTY (1-800-799-4889) to talk to a trained counselor call 1-800-273-TALK (toll free, 24 hour hotline) if  experiencing a Mental Health or Behavioral Health Crisis   Follow Up Plan: Telephone follow up appointment with care management team member scheduled for: 05-02-2022 at 1030 am       CCM Expected Outcome:  Monitor, Self-Manage and Reduce Symptoms of CAD       Current Barriers:  Chronic Disease Management support and education needs related to effective management of CAD BP Readings from Last 3 Encounters:  02/25/22 (!) 111/45  02/16/22 111/77  02/14/22 111/63     Planned Interventions: Assessed understanding of CAD diagnosis. The patient states other than having the flu and ending up in the ER she is doing okay. She did not feel like talking long today. Education and support given. Medications reviewed including medications utilized in CAD treatment plan. Is compliant with medications Provided education on importance of blood pressure control in management of CAD; Provided education on Importance of limiting foods high in cholesterol; Counseled on importance of regular laboratory monitoring as prescribed; Reviewed Importance of taking all medications as prescribed Reviewed Importance of attending all scheduled provider appointments Advised to report any changes in symptoms or exercise tolerance Advised patient to discuss changes in CAD or heart health with provider; Screening for signs and symptoms of depression related to chronic disease state;  Assessed social determinant of health barriers;   Symptom   Management: Take medications as prescribed   Attend all scheduled provider appointments Call provider office for new concerns or questions  call the Suicide and Crisis Lifeline: 988 call the [place] National Suicide Prevention Lifeline: 2504318113 or TTY: 774-397-4119 TTY (479)571-9488) to talk to a trained counselor call 1-800-273-TALK (toll free, 24 hour hotline) if experiencing a Mental Health or [place]   Follow Up Plan: Telephone follow up appointment with care  management team member scheduled for: 05-02-2022 at 79 am       CCM Expected Outcome:  Monitor, Self-Manage and Reduce Symptoms of: Falls prevention and safety       Current Barriers:  Care Coordination needs related to falls prevention and safety in a patient with  high fall risk in a patient with recent falls with injury 2 weeks ago Chronic Disease Management support and education needs related to falls and safety prevention  Planned Interventions: Provided written and verbal education re: potential causes of falls and Fall prevention strategies Reviewed medications and discussed potential side effects of medications such as dizziness and frequent urination Advised patient of importance of notifying provider of falls. Education on calling the office for new falls or safety concerns Assessed for signs and symptoms of orthostatic hypotension Assessed for falls since last encounter. Last fall was in October. The patient denies any new falls. Review of fall and safety prevention.  Assessed patients knowledge of fall risk prevention secondary to previously provided education Advised patient to discuss new falls when they occur, changes in conditions that create increased risk for falls and safety concerns with provider Screening for signs and symptoms of depression related to chronic disease state Assessed social determinant of health barriers  Symptom Management: Take medications as prescribed   Attend all scheduled provider appointments Call provider office for new concerns or questions  Work with the social worker to address care coordination needs and will continue to work with the clinical team to address health care and disease management related needs call the Suicide and Crisis Lifeline: 988 call the [place] National Suicide Prevention Lifeline: 986-856-4599 or TTY: 709-079-9009 TTY 431-682-9170) to talk to a trained counselor call 1-800-273-TALK (toll free, 24 hour hotline) if  experiencing a Mental Health or [place]   Follow Up Plan: Telephone follow up appointment with care management team member scheduled for: 05-02-2022 at 1030 am          Patient verbalizes understanding of instructions and care plan provided today and agrees to view in [place]. Active MyChart status and patient understanding of how to access instructions and care plan via MyChart confirmed with patient.     Telephone follow up appointment with care management team member scheduled for: 05-02-2022 at 50 am

## 2022-03-01 NOTE — Chronic Care Management (AMB) (Signed)
Chronic Care Management   CCM RN Visit Note  03/01/2022 Name: Tammy [name] MRN: [id number] DOB: 1945/05/06  Subjective: Tammy [name] is a 76 y.o. year old female who is a primary care patient of [name], DO. The patient was referred to the Chronic Care Management team for assistance with care management needs subsequent to provider initiation of CCM services and plan of care.    Today's Visit:  Engaged with patient by telephone for follow up visit.        Goals Addressed             This Visit's Progress    CCM (DIABETES) EXPECTED OUTCOME: MONITOR, SELF-MANAGE AND REDUCE SYMPTOMS OF DIABETES       Current Barriers:  Knowledge Deficits related to how to effectively manage and maintain DM and goal of A1C <8% Chronic Disease Management support and education needs related to effective management DM Goal set by the provider <8.1% Lab Results  Component Value Date   HGBA1C 7.5 11/23/2021     Planned Interventions: Provided education to patient about basic DM disease process; Reviewed medications with patient and discussed importance of medication adherence. The patient is compliant with medications. Education and support given;        Reviewed prescribed diet with patient heart healthy/ADA diet. The patient currently has the flu. States that she does not have an appetite. Education on staying hydrated and making sure she has plenty of liquids. The patient is feeling some better. Education and support given ; Counseled on importance of regular laboratory monitoring as prescribed;        Discussed plans with patient for ongoing care management follow up and provided patient with direct contact information for care management team;      Provided patient with written educational materials related to hypo and hyperglycemia and importance of correct treatment. Review of how to effectively manage drops in blood sugars or highs. The patient states the [facility] is really helpful  and alerts her when her blood sugars is high or low;       Reviewed scheduled/upcoming provider appointments including: 03-16-2022 at 1 pm;         Advised patient, providing education and rationale, to check cbg when you have symptoms of low or high blood sugar and has freestyle libre  and record. The patient did not provide numbers today. The patient was not feeling the best. Education provided. Will continue to monitor.      call provider for findings outside established parameters;       Review of patient status, including review of consultants reports, relevant laboratory and other test results, and medications completed;       Advised patient to discuss changes in DM and diabetes health with provider;      Screening for signs and symptoms of depression related to chronic disease state;        Assessed social determinant of health barriers;         Symptom Management: Take medications as prescribed   Attend all scheduled provider appointments Call pharmacy for medication refills 3-7 days in advance of running out of medications Call provider office for new concerns or questions  call the Suicide and Crisis Lifeline: 988 call the [place] National Suicide Prevention Lifeline: 414-691-0621 or TTY: 970-577-5582 TTY 938-880-2145) to talk to a trained counselor call 1-800-273-TALK (toll free, 24 hour hotline) if experiencing a Mental Health or [place]  check feet daily for cuts, sores  or redness trim toenails straight across manage portion size wash and dry feet carefully every day wear comfortable, cotton socks wear comfortable, well-fitting shoes  Follow Up Plan: Telephone follow up appointment with care management team member scheduled for: 05-02-2022 at 93 am       CCM Expected Outcome:  Monitor, Self-Manage and Reduce Symptoms of  breast cancer       Current Barriers:  Chronic Disease Management support and education needs related to effective management of breast  cancer  Planned Interventions: Assessed patient understanding of cancer diagnosis and recommended treatment plan Reviewed upcoming provider appointments and treatment appointments. Has appointment with the pcp for follow up on 03-16-2022 at 1 pm Assessed available transportation to appointments and treatments. Has consistent/reliable transportation: Yes Assessed support system. Has consistent/reliable family or other support: Yes Nutrition assessment performed The patient saw the oncologist yesterday and current treatment plan with Arimidex is effective. The patient states there were  no changes in the plan of care for her breast cancer treatment plan. Follows up accordingly with the oncologist  Symptom Management: Take medications as prescribed   Attend all scheduled provider appointments Call pharmacy for medication refills 3-7 days in advance of running out of medications Call provider office for new concerns or questions  call the Suicide and Crisis Lifeline: 988 call the [place] National Suicide Prevention Lifeline: (331)153-5808 or TTY: 806-381-5432 TTY (413)125-1246) to talk to a trained counselor call 1-800-273-TALK (toll free, 24 hour hotline) if experiencing a Mental Health or [place]   Follow Up Plan: Telephone follow up appointment with care management team member scheduled for: 05-02-2022 at 17 am       CCM Expected Outcome:  Monitor, Self-Manage and Reduce Symptoms of CAD       Current Barriers:  Chronic Disease Management support and education needs related to effective management of CAD BP Readings from Last 3 Encounters:  02/25/22 (!) 111/45  02/16/22 111/77  02/14/22 111/63     Planned Interventions: Assessed understanding of CAD diagnosis. The patient states other than having the flu and ending up in the ER she is doing okay. She did not feel like talking long today. Education and support given. Medications reviewed including medications utilized in  CAD treatment plan. Is compliant with medications Provided education on importance of blood pressure control in management of CAD; Provided education on Importance of limiting foods high in cholesterol; Counseled on importance of regular laboratory monitoring as prescribed; Reviewed Importance of taking all medications as prescribed Reviewed Importance of attending all scheduled provider appointments Advised to report any changes in symptoms or exercise tolerance Advised patient to discuss changes in CAD or heart health with provider; Screening for signs and symptoms of depression related to chronic disease state;  Assessed social determinant of health barriers;   Symptom Management: Take medications as prescribed   Attend all scheduled provider appointments Call provider office for new concerns or questions  call the Suicide and Crisis Lifeline: 988 call the [place] National Suicide Prevention Lifeline: 778-585-9975 or TTY: (217)032-8962 TTY (762) 550-0100) to talk to a trained counselor call 1-800-273-TALK (toll free, 24 hour hotline) if experiencing a Mental Health or [place]   Follow Up Plan: Telephone follow up appointment with care management team member scheduled for: 05-02-2022 at 13 am       CCM Expected Outcome:  Monitor, Self-Manage and Reduce Symptoms of: Falls prevention and safety       Current Barriers:  Care Coordination needs related to falls  prevention and safety in a patient with  high fall risk in a patient with recent falls with injury 2 weeks ago Chronic Disease Management support and education needs related to falls and safety prevention  Planned Interventions: Provided written and verbal education re: potential causes of falls and Fall prevention strategies Reviewed medications and discussed potential side effects of medications such as dizziness and frequent urination Advised patient of importance of notifying provider of falls. Education on  calling the office for new falls or safety concerns Assessed for signs and symptoms of orthostatic hypotension Assessed for falls since last encounter. Last fall was in October. The patient denies any new falls. Review of fall and safety prevention.  Assessed patients knowledge of fall risk prevention secondary to previously provided education Advised patient to discuss new falls when they occur, changes in conditions that create increased risk for falls and safety concerns with provider Screening for signs and symptoms of depression related to chronic disease state Assessed social determinant of health barriers  Symptom Management: Take medications as prescribed   Attend all scheduled provider appointments Call provider office for new concerns or questions  Work with the social worker to address care coordination needs and will continue to work with the clinical team to address health care and disease management related needs call the Suicide and Crisis Lifeline: 988 call the [place] National Suicide Prevention Lifeline: 769-489-4855 or TTY: (985)848-1755 TTY 367-109-6751) to talk to a trained counselor call 1-800-273-TALK (toll free, 24 hour hotline) if experiencing a Mental Health or [place]   Follow Up Plan: Telephone follow up appointment with care management team member scheduled for: 05-02-2022 at 1030 am          Plan:Telephone follow up appointment with care management team member scheduled for:  05-02-2022 at 50 am  Noreene Larsson RN, MSN, CCM RN Care Manager  Chronic Care Management Direct Number: [phone]

## 2022-03-07 ENCOUNTER — Encounter: Payer: Self-pay | Admitting: *Deleted

## 2022-03-07 ENCOUNTER — Other Ambulatory Visit: Payer: Self-pay

## 2022-03-07 ENCOUNTER — Emergency Department: Payer: Medicare Other

## 2022-03-07 ENCOUNTER — Inpatient Hospital Stay
Admission: EM | Admit: 2022-03-07 | Discharge: 2022-03-10 | DRG: 871 | Disposition: A | Discharge status: Home Health | Payer: Medicare Other | Attending: Internal Medicine | Admitting: Internal Medicine

## 2022-03-07 DIAGNOSIS — R0602 Shortness of breath: Secondary | ICD-10-CM | POA: Diagnosis not present

## 2022-03-07 DIAGNOSIS — M549 Dorsalgia, unspecified: Secondary | ICD-10-CM | POA: Diagnosis present

## 2022-03-07 DIAGNOSIS — Z86711 Personal history of pulmonary embolism: Secondary | ICD-10-CM

## 2022-03-07 DIAGNOSIS — I130 Hypertensive heart and chronic kidney disease with heart failure and stage 1 through stage 4 chronic kidney disease, or unspecified chronic kidney disease: Secondary | ICD-10-CM | POA: Diagnosis present

## 2022-03-07 DIAGNOSIS — E1122 Type 2 diabetes mellitus with diabetic chronic kidney disease: Secondary | ICD-10-CM | POA: Diagnosis present

## 2022-03-07 DIAGNOSIS — R231 Pallor: Secondary | ICD-10-CM | POA: Diagnosis not present

## 2022-03-07 DIAGNOSIS — K760 Fatty (change of) liver, not elsewhere classified: Secondary | ICD-10-CM | POA: Diagnosis not present

## 2022-03-07 DIAGNOSIS — K5750 Diverticulosis of both small and large intestine without perforation or abscess without bleeding: Secondary | ICD-10-CM | POA: Diagnosis present

## 2022-03-07 DIAGNOSIS — Z86718 Personal history of other venous thrombosis and embolism: Secondary | ICD-10-CM

## 2022-03-07 DIAGNOSIS — I2510 Atherosclerotic heart disease of native coronary artery without angina pectoris: Secondary | ICD-10-CM | POA: Diagnosis present

## 2022-03-07 DIAGNOSIS — E890 Postprocedural hypothyroidism: Secondary | ICD-10-CM | POA: Diagnosis present

## 2022-03-07 DIAGNOSIS — Z79899 Other long term (current) drug therapy: Secondary | ICD-10-CM

## 2022-03-07 DIAGNOSIS — Z7952 Long term (current) use of systemic steroids: Secondary | ICD-10-CM

## 2022-03-07 DIAGNOSIS — Z96651 Presence of right artificial knee joint: Secondary | ICD-10-CM | POA: Diagnosis present

## 2022-03-07 DIAGNOSIS — I700 Atherosclerosis of aorta: Secondary | ICD-10-CM | POA: Diagnosis not present

## 2022-03-07 DIAGNOSIS — Z6833 Body mass index (BMI) 33.0-33.9, adult: Secondary | ICD-10-CM

## 2022-03-07 DIAGNOSIS — J9601 Acute respiratory failure with hypoxia: Secondary | ICD-10-CM | POA: Diagnosis present

## 2022-03-07 DIAGNOSIS — I10 Essential (primary) hypertension: Secondary | ICD-10-CM | POA: Diagnosis not present

## 2022-03-07 DIAGNOSIS — G9341 Metabolic encephalopathy: Secondary | ICD-10-CM | POA: Diagnosis not present

## 2022-03-07 DIAGNOSIS — J439 Emphysema, unspecified: Secondary | ICD-10-CM | POA: Diagnosis present

## 2022-03-07 DIAGNOSIS — Z825 Family history of asthma and other chronic lower respiratory diseases: Secondary | ICD-10-CM

## 2022-03-07 DIAGNOSIS — E1151 Type 2 diabetes mellitus with diabetic peripheral angiopathy without gangrene: Secondary | ICD-10-CM | POA: Diagnosis present

## 2022-03-07 DIAGNOSIS — Z923 Personal history of irradiation: Secondary | ICD-10-CM

## 2022-03-07 DIAGNOSIS — R000 Tachycardia, unspecified: Secondary | ICD-10-CM | POA: Diagnosis not present

## 2022-03-07 DIAGNOSIS — J09X1 Influenza due to identified novel influenza A virus with pneumonia: Secondary | ICD-10-CM

## 2022-03-07 DIAGNOSIS — J449 Chronic obstructive pulmonary disease, unspecified: Secondary | ICD-10-CM | POA: Diagnosis not present

## 2022-03-07 DIAGNOSIS — Z91041 Radiographic dye allergy status: Secondary | ICD-10-CM

## 2022-03-07 DIAGNOSIS — Z794 Long term (current) use of insulin: Secondary | ICD-10-CM | POA: Diagnosis not present

## 2022-03-07 DIAGNOSIS — G8929 Other chronic pain: Secondary | ICD-10-CM | POA: Diagnosis present

## 2022-03-07 DIAGNOSIS — I25110 Atherosclerotic heart disease of native coronary artery with unstable angina pectoris: Secondary | ICD-10-CM | POA: Diagnosis not present

## 2022-03-07 DIAGNOSIS — Z8249 Family history of ischemic heart disease and other diseases of the circulatory system: Secondary | ICD-10-CM

## 2022-03-07 DIAGNOSIS — Z9049 Acquired absence of other specified parts of digestive tract: Secondary | ICD-10-CM

## 2022-03-07 DIAGNOSIS — R0902 Hypoxemia: Secondary | ICD-10-CM | POA: Diagnosis not present

## 2022-03-07 DIAGNOSIS — Z79890 Hormone replacement therapy (postmenopausal): Secondary | ICD-10-CM

## 2022-03-07 DIAGNOSIS — N1832 Chronic kidney disease, stage 3b: Secondary | ICD-10-CM | POA: Diagnosis present

## 2022-03-07 DIAGNOSIS — Z85118 Personal history of other malignant neoplasm of bronchus and lung: Secondary | ICD-10-CM

## 2022-03-07 DIAGNOSIS — Z888 Allergy status to other drugs, medicaments and biological substances status: Secondary | ICD-10-CM

## 2022-03-07 DIAGNOSIS — R6521 Severe sepsis with septic shock: Secondary | ICD-10-CM | POA: Diagnosis present

## 2022-03-07 DIAGNOSIS — Z7985 Long-term (current) use of injectable non-insulin antidiabetic drugs: Secondary | ICD-10-CM

## 2022-03-07 DIAGNOSIS — Z87891 Personal history of nicotine dependence: Secondary | ICD-10-CM

## 2022-03-07 DIAGNOSIS — W19XXXA Unspecified fall, initial encounter: Secondary | ICD-10-CM | POA: Diagnosis not present

## 2022-03-07 DIAGNOSIS — J984 Other disorders of lung: Secondary | ICD-10-CM | POA: Diagnosis not present

## 2022-03-07 DIAGNOSIS — I5032 Chronic diastolic (congestive) heart failure: Secondary | ICD-10-CM | POA: Diagnosis present

## 2022-03-07 DIAGNOSIS — Z801 Family history of malignant neoplasm of trachea, bronchus and lung: Secondary | ICD-10-CM

## 2022-03-07 DIAGNOSIS — R0689 Other abnormalities of breathing: Secondary | ICD-10-CM | POA: Diagnosis not present

## 2022-03-07 DIAGNOSIS — I77810 Thoracic aortic ectasia: Secondary | ICD-10-CM | POA: Diagnosis not present

## 2022-03-07 DIAGNOSIS — E785 Hyperlipidemia, unspecified: Secondary | ICD-10-CM | POA: Diagnosis present

## 2022-03-07 DIAGNOSIS — A419 Sepsis, unspecified organism: Secondary | ICD-10-CM

## 2022-03-07 DIAGNOSIS — Z7901 Long term (current) use of anticoagulants: Secondary | ICD-10-CM | POA: Diagnosis not present

## 2022-03-07 DIAGNOSIS — J432 Centrilobular emphysema: Secondary | ICD-10-CM | POA: Diagnosis not present

## 2022-03-07 DIAGNOSIS — Z20822 Contact with and (suspected) exposure to covid-19: Secondary | ICD-10-CM | POA: Diagnosis present

## 2022-03-07 DIAGNOSIS — J129 Viral pneumonia, unspecified: Secondary | ICD-10-CM | POA: Diagnosis present

## 2022-03-07 DIAGNOSIS — Z833 Family history of diabetes mellitus: Secondary | ICD-10-CM

## 2022-03-07 DIAGNOSIS — E875 Hyperkalemia: Secondary | ICD-10-CM | POA: Diagnosis not present

## 2022-03-07 DIAGNOSIS — Z79811 Long term (current) use of aromatase inhibitors: Secondary | ICD-10-CM

## 2022-03-07 DIAGNOSIS — Z9012 Acquired absence of left breast and nipple: Secondary | ICD-10-CM

## 2022-03-07 DIAGNOSIS — Z882 Allergy status to sulfonamides status: Secondary | ICD-10-CM

## 2022-03-07 DIAGNOSIS — Z853 Personal history of malignant neoplasm of breast: Secondary | ICD-10-CM

## 2022-03-07 DIAGNOSIS — Z885 Allergy status to narcotic agent status: Secondary | ICD-10-CM

## 2022-03-07 DIAGNOSIS — R410 Disorientation, unspecified: Secondary | ICD-10-CM | POA: Diagnosis not present

## 2022-03-07 DIAGNOSIS — Z886 Allergy status to analgesic agent status: Secondary | ICD-10-CM

## 2022-03-07 DIAGNOSIS — R0609 Other forms of dyspnea: Secondary | ICD-10-CM | POA: Diagnosis not present

## 2022-03-07 DIAGNOSIS — R4182 Altered mental status, unspecified: Secondary | ICD-10-CM | POA: Diagnosis not present

## 2022-03-07 DIAGNOSIS — J1008 Influenza due to other identified influenza virus with other specified pneumonia: Secondary | ICD-10-CM | POA: Diagnosis present

## 2022-03-07 DIAGNOSIS — M5090 Cervical disc disorder, unspecified, unspecified cervical region: Secondary | ICD-10-CM | POA: Diagnosis present

## 2022-03-07 DIAGNOSIS — N179 Acute kidney failure, unspecified: Secondary | ICD-10-CM | POA: Diagnosis present

## 2022-03-07 DIAGNOSIS — Z87440 Personal history of urinary (tract) infections: Secondary | ICD-10-CM

## 2022-03-07 DIAGNOSIS — I2582 Chronic total occlusion of coronary artery: Secondary | ICD-10-CM | POA: Diagnosis present

## 2022-03-07 DIAGNOSIS — J9811 Atelectasis: Secondary | ICD-10-CM | POA: Diagnosis not present

## 2022-03-07 DIAGNOSIS — E669 Obesity, unspecified: Secondary | ICD-10-CM | POA: Diagnosis present

## 2022-03-07 DIAGNOSIS — I272 Pulmonary hypertension, unspecified: Secondary | ICD-10-CM | POA: Diagnosis present

## 2022-03-07 DIAGNOSIS — I471 Supraventricular tachycardia, unspecified: Secondary | ICD-10-CM | POA: Diagnosis present

## 2022-03-07 DIAGNOSIS — I517 Cardiomegaly: Secondary | ICD-10-CM | POA: Diagnosis not present

## 2022-03-07 DIAGNOSIS — Z8673 Personal history of transient ischemic attack (TIA), and cerebral infarction without residual deficits: Secondary | ICD-10-CM

## 2022-03-07 DIAGNOSIS — A4189 Other specified sepsis: Principal | ICD-10-CM | POA: Diagnosis present

## 2022-03-07 DIAGNOSIS — N1330 Unspecified hydronephrosis: Secondary | ICD-10-CM | POA: Diagnosis present

## 2022-03-07 DIAGNOSIS — E119 Type 2 diabetes mellitus without complications: Secondary | ICD-10-CM

## 2022-03-07 DIAGNOSIS — K219 Gastro-esophageal reflux disease without esophagitis: Secondary | ICD-10-CM | POA: Diagnosis present

## 2022-03-07 DIAGNOSIS — Z7984 Long term (current) use of oral hypoglycemic drugs: Secondary | ICD-10-CM

## 2022-03-07 DIAGNOSIS — R918 Other nonspecific abnormal finding of lung field: Secondary | ICD-10-CM | POA: Diagnosis not present

## 2022-03-07 DIAGNOSIS — I491 Atrial premature depolarization: Secondary | ICD-10-CM

## 2022-03-07 DIAGNOSIS — J101 Influenza due to other identified influenza virus with other respiratory manifestations: Principal | ICD-10-CM

## 2022-03-07 DIAGNOSIS — Z86010 Personal history of colonic polyps: Secondary | ICD-10-CM

## 2022-03-07 LAB — CBC
HCT: 51.5 % — ABNORMAL HIGH (ref 36.0–46.0)
Hemoglobin: 15.7 g/dL — ABNORMAL HIGH (ref 12.0–15.0)
MCH: 27.9 pg (ref 26.0–34.0)
MCHC: 30.5 g/dL (ref 30.0–36.0)
MCV: 91.6 fL (ref 80.0–100.0)
Platelets: 301 10*3/uL (ref 150–400)
RBC: 5.62 MIL/uL — ABNORMAL HIGH (ref 3.87–5.11)
RDW: 14.9 % (ref 11.5–15.5)
WBC: 12.1 10*3/uL — ABNORMAL HIGH (ref 4.0–10.5)
nRBC: 0 % (ref 0.0–0.2)

## 2022-03-07 LAB — BASIC METABOLIC PANEL
Anion gap: 9 (ref 5–15)
BUN: 35 mg/dL — ABNORMAL HIGH (ref 8–23)
CO2: 22 mmol/L (ref 22–32)
Calcium: 8.6 mg/dL — ABNORMAL LOW (ref 8.9–10.3)
Chloride: 109 mmol/L (ref 98–111)
Creatinine, Ser: 1.84 mg/dL — ABNORMAL HIGH (ref 0.44–1.00)
GFR, Estimated: 28 mL/min — ABNORMAL LOW (ref >60)
Glucose, Bld: 169 mg/dL — ABNORMAL HIGH (ref 70–99)
Potassium: 5.3 mmol/L — ABNORMAL HIGH (ref 3.5–5.1)
Sodium: 140 mmol/L (ref 135–145)

## 2022-03-07 LAB — TROPONIN I (HIGH SENSITIVITY): Troponin I (High Sensitivity): 11 ng/L (ref <18)

## 2022-03-07 NOTE — ED Notes (Signed)
Pt placed on cardiac monitor. Pt on 4LNC. Pt is sleepy but arouse able with GCS 15.

## 2022-03-07 NOTE — ED Notes (Signed)
Pt sleepy.  Pt in recliner in triage.

## 2022-03-07 NOTE — ED Triage Notes (Addendum)
Pt brought in via ems with sob, weakness and syncopal episode.  Pt lethargic/sleepy in triage.  Oxygen sats 85% on room air.  Pt denies chest pain.  Pt in recliner in triage.   Pt was flu A  positive on 02/24/22

## 2022-03-07 NOTE — ED Triage Notes (Signed)
Pt comes via EMs from home with c/o syncopal episode off toilet. Pt did not hit head or loc. Pt just slid off. Pt states she has been sick for about two weeks. Pt was little sinus tachy.

## 2022-03-07 NOTE — ED Notes (Signed)
Pt placed on 4 liters oxygen [place].  Oxygen sats 92% on oxygen.  Pt still sleepy.

## 2022-03-07 NOTE — ED Provider Triage Note (Signed)
Emergency Medicine Provider Triage Evaluation Note  Tammy [name] , a 76 y.o. female  was evaluated in triage.  Pt complains of SOB. She presents from her home with hypoxia, weakness, SOB, and new O2 need. EMS reports a syncopal episode in her bathroom. No head injury or LOC.   Review of Systems  Positive: SOB, weakness Negative: NVD  Physical Exam  BP 112/76 (BP Location: Left Arm)   Pulse (!) 116   Temp 98.8 F (37.1 C) (Oral)   Resp (!) 26   Ht 5\' 6"  (1.676 m)   Wt 94 kg   SpO2 (!) 89%   BMI 33.45 kg/m  Gen:   Awake, no distress  NAD Resp:  Normal effort CTA MSK:   Moves extremities without difficulty  Other:    Medical Decision Making  Medically screening exam initiated at 10:11 PM.  Appropriate orders placed.  SHAWN DANNENBERG was informed that the remainder of the evaluation will be completed by another provider, this initial triage assessment does not replace that evaluation, and the importance of remaining in the ED until their evaluation is complete.  Patient to the ED from home with reports of syncope, weakness, and new O2 need.    Melvenia Needles, PA-C 03/07/22 2213

## 2022-03-08 ENCOUNTER — Other Ambulatory Visit: Payer: Self-pay

## 2022-03-08 ENCOUNTER — Inpatient Hospital Stay: Payer: Medicare Other

## 2022-03-08 ENCOUNTER — Emergency Department: Payer: Medicare Other

## 2022-03-08 DIAGNOSIS — Z7901 Long term (current) use of anticoagulants: Secondary | ICD-10-CM | POA: Diagnosis not present

## 2022-03-08 DIAGNOSIS — R0602 Shortness of breath: Secondary | ICD-10-CM | POA: Diagnosis present

## 2022-03-08 DIAGNOSIS — J984 Other disorders of lung: Secondary | ICD-10-CM | POA: Diagnosis not present

## 2022-03-08 DIAGNOSIS — G9341 Metabolic encephalopathy: Secondary | ICD-10-CM

## 2022-03-08 DIAGNOSIS — J9811 Atelectasis: Secondary | ICD-10-CM | POA: Diagnosis not present

## 2022-03-08 DIAGNOSIS — J9601 Acute respiratory failure with hypoxia: Secondary | ICD-10-CM | POA: Diagnosis present

## 2022-03-08 DIAGNOSIS — Z794 Long term (current) use of insulin: Secondary | ICD-10-CM | POA: Diagnosis not present

## 2022-03-08 DIAGNOSIS — I25110 Atherosclerotic heart disease of native coronary artery with unstable angina pectoris: Secondary | ICD-10-CM | POA: Diagnosis not present

## 2022-03-08 DIAGNOSIS — M549 Dorsalgia, unspecified: Secondary | ICD-10-CM | POA: Diagnosis not present

## 2022-03-08 DIAGNOSIS — I77810 Thoracic aortic ectasia: Secondary | ICD-10-CM | POA: Diagnosis not present

## 2022-03-08 DIAGNOSIS — K760 Fatty (change of) liver, not elsewhere classified: Secondary | ICD-10-CM | POA: Diagnosis present

## 2022-03-08 DIAGNOSIS — N1832 Chronic kidney disease, stage 3b: Secondary | ICD-10-CM

## 2022-03-08 DIAGNOSIS — N179 Acute kidney failure, unspecified: Secondary | ICD-10-CM | POA: Diagnosis present

## 2022-03-08 DIAGNOSIS — R4182 Altered mental status, unspecified: Secondary | ICD-10-CM | POA: Diagnosis not present

## 2022-03-08 DIAGNOSIS — Z9012 Acquired absence of left breast and nipple: Secondary | ICD-10-CM | POA: Diagnosis not present

## 2022-03-08 DIAGNOSIS — I517 Cardiomegaly: Secondary | ICD-10-CM | POA: Diagnosis not present

## 2022-03-08 DIAGNOSIS — R0609 Other forms of dyspnea: Secondary | ICD-10-CM | POA: Diagnosis not present

## 2022-03-08 DIAGNOSIS — I130 Hypertensive heart and chronic kidney disease with heart failure and stage 1 through stage 4 chronic kidney disease, or unspecified chronic kidney disease: Secondary | ICD-10-CM | POA: Diagnosis present

## 2022-03-08 DIAGNOSIS — J449 Chronic obstructive pulmonary disease, unspecified: Secondary | ICD-10-CM | POA: Diagnosis not present

## 2022-03-08 DIAGNOSIS — J1008 Influenza due to other identified influenza virus with other specified pneumonia: Secondary | ICD-10-CM | POA: Diagnosis present

## 2022-03-08 DIAGNOSIS — I471 Supraventricular tachycardia, unspecified: Secondary | ICD-10-CM | POA: Diagnosis present

## 2022-03-08 DIAGNOSIS — A419 Sepsis, unspecified organism: Secondary | ICD-10-CM

## 2022-03-08 DIAGNOSIS — K5750 Diverticulosis of both small and large intestine without perforation or abscess without bleeding: Secondary | ICD-10-CM | POA: Diagnosis not present

## 2022-03-08 DIAGNOSIS — E1122 Type 2 diabetes mellitus with diabetic chronic kidney disease: Secondary | ICD-10-CM | POA: Diagnosis present

## 2022-03-08 DIAGNOSIS — I700 Atherosclerosis of aorta: Secondary | ICD-10-CM | POA: Diagnosis present

## 2022-03-08 DIAGNOSIS — E890 Postprocedural hypothyroidism: Secondary | ICD-10-CM | POA: Diagnosis present

## 2022-03-08 DIAGNOSIS — J432 Centrilobular emphysema: Secondary | ICD-10-CM | POA: Diagnosis not present

## 2022-03-08 DIAGNOSIS — J09X1 Influenza due to identified novel influenza A virus with pneumonia: Secondary | ICD-10-CM | POA: Diagnosis not present

## 2022-03-08 DIAGNOSIS — J129 Viral pneumonia, unspecified: Secondary | ICD-10-CM | POA: Diagnosis present

## 2022-03-08 DIAGNOSIS — Z20822 Contact with and (suspected) exposure to covid-19: Secondary | ICD-10-CM | POA: Diagnosis present

## 2022-03-08 DIAGNOSIS — I272 Pulmonary hypertension, unspecified: Secondary | ICD-10-CM | POA: Diagnosis present

## 2022-03-08 DIAGNOSIS — G8929 Other chronic pain: Secondary | ICD-10-CM | POA: Diagnosis not present

## 2022-03-08 DIAGNOSIS — J439 Emphysema, unspecified: Secondary | ICD-10-CM | POA: Diagnosis present

## 2022-03-08 DIAGNOSIS — R6521 Severe sepsis with septic shock: Secondary | ICD-10-CM

## 2022-03-08 DIAGNOSIS — E875 Hyperkalemia: Secondary | ICD-10-CM | POA: Diagnosis not present

## 2022-03-08 DIAGNOSIS — N1330 Unspecified hydronephrosis: Secondary | ICD-10-CM | POA: Diagnosis present

## 2022-03-08 DIAGNOSIS — I5032 Chronic diastolic (congestive) heart failure: Secondary | ICD-10-CM | POA: Diagnosis present

## 2022-03-08 DIAGNOSIS — E1151 Type 2 diabetes mellitus with diabetic peripheral angiopathy without gangrene: Secondary | ICD-10-CM | POA: Diagnosis present

## 2022-03-08 DIAGNOSIS — A4189 Other specified sepsis: Secondary | ICD-10-CM | POA: Diagnosis present

## 2022-03-08 DIAGNOSIS — E785 Hyperlipidemia, unspecified: Secondary | ICD-10-CM | POA: Diagnosis present

## 2022-03-08 DIAGNOSIS — E669 Obesity, unspecified: Secondary | ICD-10-CM | POA: Diagnosis present

## 2022-03-08 DIAGNOSIS — R000 Tachycardia, unspecified: Secondary | ICD-10-CM | POA: Diagnosis not present

## 2022-03-08 LAB — BLOOD GAS, VENOUS
Acid-Base Excess: 0 mmol/L (ref 0.0–2.0)
Acid-base deficit: 3.8 mmol/L — ABNORMAL HIGH (ref 0.0–2.0)
Bicarbonate: 24.7 mmol/L (ref 20.0–28.0)
Bicarbonate: 27.2 mmol/L (ref 20.0–28.0)
O2 Saturation: 29.8 %
O2 Saturation: 42.5 %
Patient temperature: 37
Patient temperature: 37
pCO2, Ven: 54 mmHg (ref 44–60)
pCO2, Ven: 59 mmHg (ref 44–60)
pH, Ven: 7.23 — ABNORMAL LOW (ref 7.25–7.43)
pH, Ven: 7.31 (ref 7.25–7.43)
pO2, Ven: 37 mmHg (ref 32–45)
pO2, Ven: <31 mmHg — CL (ref 32–45)

## 2022-03-08 LAB — TROPONIN I (HIGH SENSITIVITY): Troponin I (High Sensitivity): 12 ng/L (ref <18)

## 2022-03-08 LAB — HEPATIC FUNCTION PANEL
ALT: 15 U/L (ref 0–44)
AST: 22 U/L (ref 15–41)
Albumin: 2.3 g/dL — ABNORMAL LOW (ref 3.5–5.0)
Alkaline Phosphatase: 61 U/L (ref 38–126)
Bilirubin, Direct: <0.1 mg/dL (ref 0.0–0.2)
Total Bilirubin: 0.4 mg/dL (ref 0.3–1.2)
Total Protein: 5.7 g/dL — ABNORMAL LOW (ref 6.5–8.1)

## 2022-03-08 LAB — BASIC METABOLIC PANEL
Anion gap: 10 (ref 5–15)
BUN: 40 mg/dL — ABNORMAL HIGH (ref 8–23)
CO2: 22 mmol/L (ref 22–32)
Calcium: 8.3 mg/dL — ABNORMAL LOW (ref 8.9–10.3)
Chloride: 114 mmol/L — ABNORMAL HIGH (ref 98–111)
Creatinine, Ser: 1.84 mg/dL — ABNORMAL HIGH (ref 0.44–1.00)
GFR, Estimated: 28 mL/min — ABNORMAL LOW (ref >60)
Glucose, Bld: 86 mg/dL (ref 70–99)
Potassium: 4.5 mmol/L (ref 3.5–5.1)
Sodium: 146 mmol/L — ABNORMAL HIGH (ref 135–145)

## 2022-03-08 LAB — PROTIME-INR
INR: 1.1 (ref 0.8–1.2)
Prothrombin Time: 13.9 seconds (ref 11.4–15.2)

## 2022-03-08 LAB — CBC
HCT: 45.5 % (ref 36.0–46.0)
Hemoglobin: 13.9 g/dL (ref 12.0–15.0)
MCH: 28.2 pg (ref 26.0–34.0)
MCHC: 30.5 g/dL (ref 30.0–36.0)
MCV: 92.3 fL (ref 80.0–100.0)
Platelets: 271 10*3/uL (ref 150–400)
RBC: 4.93 MIL/uL (ref 3.87–5.11)
RDW: 15 % (ref 11.5–15.5)
WBC: 10.6 10*3/uL — ABNORMAL HIGH (ref 4.0–10.5)
nRBC: 0 % (ref 0.0–0.2)

## 2022-03-08 LAB — CBG MONITORING, ED
Glucose-Capillary: 72 mg/dL (ref 70–99)
Glucose-Capillary: 87 mg/dL (ref 70–99)
Glucose-Capillary: 67 mg/dL — ABNORMAL LOW (ref 70–99)
Glucose-Capillary: 116 mg/dL — ABNORMAL HIGH (ref 70–99)
Glucose-Capillary: 90 mg/dL (ref 70–99)

## 2022-03-08 LAB — MAGNESIUM
Magnesium: 2.1 mg/dL (ref 1.7–2.4)
Magnesium: 2 mg/dL (ref 1.7–2.4)

## 2022-03-08 LAB — RESP PANEL BY RT-PCR (RSV, FLU A&B, COVID)  RVPGX2
Influenza A by PCR: POSITIVE — AB
Influenza B by PCR: NEGATIVE
Resp Syncytial Virus by PCR: NEGATIVE
SARS Coronavirus 2 by RT PCR: NEGATIVE

## 2022-03-08 LAB — CORTISOL-AM, BLOOD: Cortisol - AM: 17.9 ug/dL (ref 6.7–22.6)

## 2022-03-08 LAB — PHOSPHORUS: Phosphorus: 3.9 mg/dL (ref 2.5–4.6)

## 2022-03-08 LAB — LACTIC ACID, PLASMA: Lactic Acid, Venous: 0.9 mmol/L (ref 0.5–1.9)

## 2022-03-08 LAB — PROCALCITONIN: Procalcitonin: 0.47 ng/mL

## 2022-03-08 LAB — BRAIN NATRIURETIC PEPTIDE: B Natriuretic Peptide: 58.6 pg/mL (ref 0.0–100.0)

## 2022-03-08 MED ORDER — DEXTROSE 50 % IV SOLN
1.0000 | Freq: Once | INTRAVENOUS | Status: AC
  Administered 2022-03-08: 50 mL via INTRAVENOUS
  Filled 2022-03-08: qty 50

## 2022-03-08 MED ORDER — ONDANSETRON HCL 4 MG/2ML IJ SOLN
4.0000 mg | Freq: Once | INTRAMUSCULAR | Status: AC
  Administered 2022-03-08: 4 mg via INTRAVENOUS
  Filled 2022-03-08: qty 2

## 2022-03-08 MED ORDER — GUAIFENESIN ER 600 MG PO TB12
1200.0000 mg | ORAL_TABLET | Freq: Two times a day (BID) | ORAL | Status: DC
  Administered 2022-03-08 – 2022-03-10 (×5): 1200 mg via ORAL
  Filled 2022-03-08 (×5): qty 2

## 2022-03-08 MED ORDER — ROSUVASTATIN CALCIUM 10 MG PO TABS
40.0000 mg | ORAL_TABLET | Freq: Every day | ORAL | Status: DC
  Administered 2022-03-08 – 2022-03-10 (×3): 40 mg via ORAL
  Filled 2022-03-08 (×2): qty 2
  Filled 2022-03-08: qty 4

## 2022-03-08 MED ORDER — ACETAMINOPHEN 500 MG PO TABS
1000.0000 mg | ORAL_TABLET | Freq: Once | ORAL | Status: AC
  Administered 2022-03-08: 1000 mg via ORAL
  Filled 2022-03-08: qty 2

## 2022-03-08 MED ORDER — ISOSORBIDE MONONITRATE ER 60 MG PO TB24
60.0000 mg | ORAL_TABLET | Freq: Every day | ORAL | Status: DC
  Administered 2022-03-08 – 2022-03-10 (×3): 60 mg via ORAL
  Filled 2022-03-08 (×3): qty 1

## 2022-03-08 MED ORDER — NITROGLYCERIN 0.4 MG SL SUBL: 0.4000 mg | SUBLINGUAL_TABLET | SUBLINGUAL | Status: DC | PRN

## 2022-03-08 MED ORDER — SODIUM CHLORIDE 0.9 % IV SOLN
500.0000 mg | INTRAVENOUS | Status: DC
  Administered 2022-03-08 – 2022-03-10 (×3): 500 mg via INTRAVENOUS
  Filled 2022-03-08 (×3): qty 5

## 2022-03-08 MED ORDER — LACTATED RINGERS IV SOLN: INTRAVENOUS | Status: AC

## 2022-03-08 MED ORDER — ALBUTEROL SULFATE (2.5 MG/3ML) 0.083% IN NEBU: 2.5000 mg | INHALATION_SOLUTION | Freq: Four times a day (QID) | RESPIRATORY_TRACT | Status: DC | PRN

## 2022-03-08 MED ORDER — OXYCODONE-ACETAMINOPHEN 5-325 MG PO TABS: 1.0000 | ORAL_TABLET | Freq: Four times a day (QID) | ORAL | Status: DC | PRN

## 2022-03-08 MED ORDER — ACETAMINOPHEN 650 MG RE SUPP: 650.0000 mg | Freq: Four times a day (QID) | RECTAL | Status: DC | PRN

## 2022-03-08 MED ORDER — LEVALBUTEROL HCL 0.63 MG/3ML IN NEBU
0.6300 mg | INHALATION_SOLUTION | Freq: Four times a day (QID) | RESPIRATORY_TRACT | Status: DC
  Administered 2022-03-08 – 2022-03-10 (×8): 0.63 mg via RESPIRATORY_TRACT
  Filled 2022-03-08 (×8): qty 3

## 2022-03-08 MED ORDER — INSULIN GLARGINE-YFGN 100 UNIT/ML ~~LOC~~ SOLN
15.0000 [IU] | Freq: Every day | SUBCUTANEOUS | Status: DC
  Administered 2022-03-09: 15 [IU] via SUBCUTANEOUS
  Filled 2022-03-08 (×3): qty 0.15

## 2022-03-08 MED ORDER — BENZONATATE 100 MG PO CAPS: 200.0000 mg | ORAL_CAPSULE | Freq: Two times a day (BID) | ORAL | Status: DC | PRN

## 2022-03-08 MED ORDER — ACETAMINOPHEN 325 MG PO TABS
650.0000 mg | ORAL_TABLET | Freq: Four times a day (QID) | ORAL | Status: DC | PRN
  Filled 2022-03-08: qty 2

## 2022-03-08 MED ORDER — SODIUM CHLORIDE 0.9 % IV SOLN
2.0000 g | Freq: Once | INTRAVENOUS | Status: AC
  Administered 2022-03-08: 2 g via INTRAVENOUS
  Filled 2022-03-08: qty 20

## 2022-03-08 MED ORDER — SODIUM CHLORIDE 0.9 % IV SOLN: 500.0000 mg | Freq: Once | INTRAVENOUS | Status: DC

## 2022-03-08 MED ORDER — INSULIN ASPART 100 UNIT/ML IJ SOLN
0.0000 [IU] | INTRAMUSCULAR | Status: DC
  Administered 2022-03-09 – 2022-03-10 (×2): 2 [IU] via SUBCUTANEOUS
  Filled 2022-03-08 (×2): qty 1

## 2022-03-08 MED ORDER — ALBUTEROL SULFATE HFA 108 (90 BASE) MCG/ACT IN AERS: 2.0000 | INHALATION_SPRAY | Freq: Four times a day (QID) | RESPIRATORY_TRACT | Status: DC | PRN

## 2022-03-08 MED ORDER — NOREPINEPHRINE 4 MG/250ML-% IV SOLN
INTRAVENOUS | Status: AC
  Administered 2022-03-08: 4 mg
  Filled 2022-03-08: qty 250

## 2022-03-08 MED ORDER — APIXABAN 2.5 MG PO TABS
2.5000 mg | ORAL_TABLET | Freq: Two times a day (BID) | ORAL | Status: DC
  Administered 2022-03-08 – 2022-03-10 (×5): 2.5 mg via ORAL
  Filled 2022-03-08 (×6): qty 1

## 2022-03-08 MED ORDER — IPRATROPIUM BROMIDE 0.02 % IN SOLN
0.5000 mg | Freq: Four times a day (QID) | RESPIRATORY_TRACT | Status: DC
  Administered 2022-03-08 – 2022-03-10 (×8): 0.5 mg via RESPIRATORY_TRACT
  Filled 2022-03-08 (×8): qty 2.5

## 2022-03-08 MED ORDER — ONDANSETRON HCL 4 MG/2ML IJ SOLN: 4.0000 mg | Freq: Four times a day (QID) | INTRAMUSCULAR | Status: DC | PRN

## 2022-03-08 MED ORDER — SODIUM CHLORIDE 0.9 % IV BOLUS (SEPSIS)
1000.0000 mL | Freq: Once | INTRAVENOUS | Status: AC
  Administered 2022-03-08: 1000 mL via INTRAVENOUS

## 2022-03-08 MED ORDER — PANTOPRAZOLE SODIUM 40 MG PO TBEC
40.0000 mg | DELAYED_RELEASE_TABLET | Freq: Every day | ORAL | Status: DC
  Administered 2022-03-08 – 2022-03-10 (×3): 40 mg via ORAL
  Filled 2022-03-08 (×3): qty 1

## 2022-03-08 MED ORDER — ONDANSETRON HCL 4 MG PO TABS: 4.0000 mg | ORAL_TABLET | Freq: Four times a day (QID) | ORAL | Status: DC | PRN

## 2022-03-08 MED ORDER — MIRABEGRON ER 50 MG PO TB24
50.0000 mg | ORAL_TABLET | Freq: Every day | ORAL | Status: DC
  Administered 2022-03-08 – 2022-03-10 (×3): 50 mg via ORAL
  Filled 2022-03-08 (×3): qty 1

## 2022-03-08 MED ORDER — DULOXETINE HCL 30 MG PO CPEP
120.0000 mg | ORAL_CAPSULE | Freq: Every day | ORAL | Status: DC
  Administered 2022-03-08 – 2022-03-10 (×3): 120 mg via ORAL
  Filled 2022-03-08: qty 4
  Filled 2022-03-08 (×2): qty 2

## 2022-03-08 MED ORDER — LACTATED RINGERS IV BOLUS (SEPSIS)
500.0000 mL | Freq: Once | INTRAVENOUS | Status: AC
  Administered 2022-03-08: 500 mL via INTRAVENOUS

## 2022-03-08 MED ORDER — SODIUM CHLORIDE 0.9 % IV SOLN
2.0000 g | INTRAVENOUS | Status: DC
  Administered 2022-03-09 – 2022-03-10 (×2): 2 g via INTRAVENOUS
  Filled 2022-03-08 (×2): qty 20

## 2022-03-08 MED ORDER — SODIUM CHLORIDE 0.9 % IV BOLUS
500.0000 mL | Freq: Once | INTRAVENOUS | Status: AC
  Administered 2022-03-08: 500 mL via INTRAVENOUS

## 2022-03-08 MED ORDER — LEVOTHYROXINE SODIUM 50 MCG PO TABS
150.0000 ug | ORAL_TABLET | Freq: Every day | ORAL | Status: DC
  Administered 2022-03-08 – 2022-03-10 (×3): 150 ug via ORAL
  Filled 2022-03-08 (×2): qty 1
  Filled 2022-03-08 (×2): qty 3

## 2022-03-08 MED ORDER — ANASTROZOLE 1 MG PO TABS
1.0000 mg | ORAL_TABLET | Freq: Every day | ORAL | Status: DC
  Administered 2022-03-08 – 2022-03-10 (×3): 1 mg via ORAL
  Filled 2022-03-08 (×3): qty 1

## 2022-03-08 MED ORDER — SODIUM CHLORIDE 0.9 % IV BOLUS (SEPSIS)
500.0000 mL | Freq: Once | INTRAVENOUS | Status: AC
  Administered 2022-03-08: 500 mL via INTRAVENOUS

## 2022-03-08 NOTE — ED Notes (Signed)
Paged [name] NP

## 2022-03-08 NOTE — Progress Notes (Addendum)
       CROSS COVER NOTE  NAME: Tammy [name] MRN: [id number] DOB : [birth year]    HPI/Events of Note   Rapid response called after patient became altered and was noted to be  tachycardic to the 140s, hypotensive to systolic of the 76L and with increased O2 requirement to 5 L.  Upon my arrival fume this later, patient was more awake and alert and close to baseline when admitted a couple hours earlier SBP had rebounded to the 1 teens with heart rate 100-1 10.  Patient was mentating well stating that she felt okay    03/08/2022    5:15 AM 03/08/2022    4:49 AM 03/08/2022    4:41 AM  Vitals with BMI  Pulse 106 109 109   Vitals:   03/08/22 0435 03/08/22 0441 03/08/22 0449 03/08/22 0515  BP:      Pulse: (!) 109 (!) 109 (!) 109 (!) 106  Temp:      Resp:      Height:      Weight:      SpO2: (!) 88% 90% (!) 88% 100%  TempSrc:      BMI (Calculated):         Physical Exam Vitals and nursing note reviewed.  Constitutional:      General: She is not in acute distress. HENT:     Head: Normocephalic and atraumatic.  Cardiovascular:     Rate and Rhythm: Regular rhythm. Tachycardia present.     Heart sounds: Normal heart sounds.  Pulmonary:     Effort: Tachypnea present.     Breath sounds: Wheezing present.  Abdominal:     Palpations: Abdomen is soft.     Tenderness: There is no abdominal tenderness.  Musculoskeletal:     Right lower leg: No tenderness. No edema.     Left lower leg: No tenderness. No edema.  Neurological:     Mental Status: Mental status is at baseline.        Assessment and  Interventions   Assessment: Episode of decreased responsiveness with worsening hypoxia, tachycardia and hypotension Plan: Chest x-ray to evaluate for possible fluid overload  EKG, ABG.   Continue telemetry monitor for tachyarrhythmias Continue sepsis fluids and prior management  CRITICAL CARE Performed by: Athena Masse   Total critical care time: 30 minutes  Critical care time  was exclusive of separately billable procedures and treating other patients.  Critical care was necessary to treat or prevent imminent or life-threatening deterioration.  Critical care was time spent personally by me on the following activities: development of treatment plan with patient and/or surrogate as well as nursing, discussions with consultants, evaluation of patient's response to treatment, examination of patient, obtaining history from patient or surrogate, ordering and performing treatments and interventions, ordering and review of laboratory studies, ordering and review of radiographic studies, pulse oximetry and re-evaluation of patient's condition.

## 2022-03-08 NOTE — ED Notes (Signed)
This RN requested clarification on pt's NPO status, or order for D50 again from Dr. Alfredia Ferguson for low glucose.

## 2022-03-08 NOTE — Assessment & Plan Note (Signed)
No complaints of chest pain and troponin negative and EKG nonacute Continue Imdur, nitroglycerin as needed and rosuvastatin

## 2022-03-08 NOTE — ED Notes (Signed)
Pt had bowel movement. Pt cleaned / linens changed/ per care preformed. 2 staff assist.

## 2022-03-08 NOTE — Assessment & Plan Note (Signed)
History of PE Continue apixaban

## 2022-03-08 NOTE — ED Provider Notes (Addendum)
[facility] Provider Note    Event Date/Time   First MD Initiated Contact with Patient 03/07/22 2336     (approximate)   History   Shortness of Breath and Weakness   HPI  Tammy [name] is a 76 y.o. female with previous history of breast cancer, lung cancer, CHF, COPD, CKD, coronary artery disease, hypertension, hyperlipidemia, hypothyroidism, DVT presents to the emergency department with complaints of cough, congestion, chest pain, shortness of breath, right lower quadrant abdominal pain, nausea and diarrhea.  No known sick contacts.  Patient is drowsy but arousable.  She has had previous cholecystectomy and appendectomy.  Patient tested positive for the flu in the ED on 02/24/2022.   History provided by patient.    Past Medical History:  Diagnosis Date   Asthma    Benign neoplasm of colon    Breast cancer ([place]) 08/2018   left breast   Cervical disc disease    "bulging" - no limitations per pt   Chronic diastolic CHF (congestive heart failure) ([place])    a. 07/2012 Echo: Nl EF. mild inferoseptal HK, Gr 2 DD, mild conc LVH, mild PR/TR, mild PAH; b. 08/2019 Echo: EF 55-60%, no rwma, Gr1 DD. Nl RV size/fxn.   CKD (chronic kidney disease), stage III (HCC)    COPD (chronic obstructive pulmonary disease) (HCC)    Coronary artery disease    a. 11/2011 Cath: LAD 39md, LCX min irregs, RCA 70p, 1081mith L->R collats, EF 60%-->Med Rx; b. 03/2019 Cath: LM nl, LAD 40-5043mCX large, mild diff dzs. OM1/2 nl. RCA known to be 100m62m->R collats from LAD & LCX/OM.   Diabetes mellitus without complication (HCC)    Fusion of lumbar spine 12/22/2015   GERD (gastroesophageal reflux disease)    History of DVT (deep vein thrombosis)    History of tobacco abuse    a. Quit 2011.   Hyperlipidemia    Hypertension    Hypothyroidism    Lung cancer (HCC)[place] Obesity    Palpitations    Personal history of radiation therapy    PONV (postoperative nausea and vomiting)     Pulmonary embolus (HCC)[place]/15/2015   RLL. Presented with SOB and elevated d-dimer.    PVD (peripheral vascular disease) (HCC)[place] a. PTA of right leg and left femoral artery with stenosis; b. 09/2020 ABI: R 0.93, L 0.88 - unchanged from prior study.   [facility]   TIAs - no deficits    Past Surgical History:  Procedure Laterality Date   ANGIOPLASTY / STENTING FEMORAL     PVD; angioplasty right leg and left femoral artery with stenosis.    APPENDECTOMY  12/2014   BACK SURGERY  03/2012   nerve stimulator inserted   BACK SURGERY     screws, rods replaced with new hardware.   BACK SURGERY  11/2016   BREAST LUMPECTOMY Left 08/26/2018   BREAST SURGERY     CARDIAC CATHETERIZATION  12/07/2011   Mid LAD 50%, distal LAD 50%, mid RCA 70%, distal RCA 100%    CARDIAC CATHETERIZATION  01/04/2011   100% occluded mid RCA with good collaterals from distal LAD, normal LVEF.   CATARACT EXTRACTION Right 12/21/2021   CHOLECYSTECTOMY     COLONOSCOPY  2013   COLONOSCOPY WITH PROPOFOL N/A 05/24/2015   Procedure: COLONOSCOPY WITH PROPOFOL;  Surgeon: DarrLucilla Lame;  [place];  Laterality: N/A;  Diabetic - oral meds  COLONOSCOPY WITH PROPOFOL N/A 01/28/2019   Procedure: COLONOSCOPY WITH PROPOFOL;  Surgeon: [name], MD;  Location: [facility] ENDOSCOPY;  Service: Endoscopy;  Laterality: N/A;   DIAGNOSTIC LAPAROSCOPY     ESOPHAGOGASTRODUODENOSCOPY (EGD) WITH PROPOFOL N/A 01/28/2019   Procedure: ESOPHAGOGASTRODUODENOSCOPY (EGD) WITH PROPOFOL;  Surgeon: [name], MD;  Location: [facility] ENDOSCOPY;  Service: Endoscopy;  Laterality: N/A;   LAPAROSCOPIC APPENDECTOMY N/A 01/06/2015   Procedure: APPENDECTOMY LAPAROSCOPIC;  Surgeon: [name], MD;  Location: [facility] ORS;  Service: General;  Laterality: N/A;   LEFT HEART CATH AND CORONARY ANGIOGRAPHY Left 04/04/2019   Procedure: LEFT HEART CATH AND CORONARY ANGIOGRAPHY;  Surgeon: [name], MD;  Location: [place] CV LAB;  Service: Cardiovascular;  Laterality: Left;   MASTECTOMY W/ SENTINEL NODE BIOPSY Left 08/26/2018   Procedure: PARTIAL MASTECTOMY WIDE EXCISION WITH SENTINEL LYMPH NODE BIOPSY LEFT;  Surgeon: [name], MD;  Location: [facility] ORS;  Service: General;  Laterality: Left;   POLYPECTOMY  05/24/2015   Procedure: POLYPECTOMY;  Surgeon: [name], MD;  Location: [place];  Service: Endoscopy;;   REPLACEMENT TOTAL KNEE Right    RIGHT/LEFT HEART CATH AND CORONARY ANGIOGRAPHY Bilateral 09/26/2021   Procedure: RIGHT/LEFT HEART CATH AND CORONARY ANGIOGRAPHY;  Surgeon: Wellington Hampshire, MD;  Location: [place] CV LAB;  Service: Cardiovascular;  Laterality: Bilateral;   THYROIDECTOMY     TOTAL KNEE ARTHROPLASTY Right 11/13/2019   TRIGGER FINGER RELEASE      MEDICATIONS:  Prior to Admission medications   Medication Sig Start Date End Date Taking? Authorizing Provider  acyclovir (ZOVIRAX) 800 MG tablet Take 800 mg by mouth 4 (four) times daily. 10/21/21   [provider]  albuterol (PROVENTIL) (2.5 MG/3ML) 0.083% nebulizer solution Take 3 mLs (2.5 mg total) by nebulization every 6 (six) hours as needed for wheezing or shortness of breath. 07/14/21   Chesley Mires, MD  albuterol (VENTOLIN HFA) 108 (90 Base) MCG/ACT inhaler Inhale 2 puffs into the lungs every 6 (six) hours as needed for wheezing or shortness of breath. 01/13/21   Johnson, Megan P, DO  anastrozole (ARIMIDEX) 1 MG tablet TAKE 1 TABLET BY MOUTH DAILY 12/14/21   Cammie Sickle, MD  benzonatate (TESSALON) 200 MG capsule Take 1 capsule (200 mg total) by mouth 2 (two) times daily as needed for cough. 02/16/22   Johnson, Megan P, DO  blood glucose meter kit and supplies KIT Dispense based on patient and insurance preference. Use one time daily as directed, E11.9 04/23/15   Lada, Satira Anis, MD  Calcium Carb-Cholecalciferol (CALCIUM 600 + D) 600-200 MG-UNIT TABS Take 1 tablet by mouth  daily.     [provider]  cholecalciferol (VITAMIN D3) 25 MCG (1000 UT) tablet Take 1,000 Units by mouth daily.    [provider]  CRANBERRY PO Take 2 capsules by mouth daily.    [provider]  cyclobenzaprine (FLEXERIL) 5 MG tablet Take 1 tablet (5 mg total) by mouth 3 (three) times daily as needed for muscle spasms. 08/23/21   Vigg, Avanti, MD  Dulaglutide (TRULICITY) 4.5 ZO/1.0RU SOPN INJECT 4.5 MG INTO THE SKIN ONCE A WEEK 02/17/22   Johnson, Megan P, DO  DULoxetine (CYMBALTA) 60 MG capsule TAKE 2 CAPSULES BY MOUTH EVERY DAY 09/09/21   Johnson, Megan P, DO  ELIQUIS 2.5 MG TABS tablet TAKE ONE TABLET BY MOUTH TWICE DAILY 10/28/21   Johnson, Megan P, DO  fluticasone (FLONASE) 50 MCG/ACT nasal spray Place 1 spray into  both nostrils daily. 07/14/21   Chesley Mires, MD  gabapentin (NEURONTIN) 300 MG capsule Take 2 capsules (600 mg total) by mouth 3 (three) times daily. Patient not taking: Reported on 02/24/2022 11/08/21   Park Liter P, DO  gabapentin (NEURONTIN) 400 MG capsule Take 400 mg by mouth 3 (three) times daily. 01/30/22   [provider]  glipiZIDE (GLUCOTROL) 5 MG tablet TAKE 1 TABLET BY MOUTH DAILY BEFORE BREAKFAST 12/06/21   Johnson, Megan P, DO  insulin degludec (TRESIBA FLEXTOUCH) 100 UNIT/ML FlexTouch Pen Inject 40 Units into the skin daily. 11/02/21   Park Liter P, DO  Insulin Pen Needle (PEN NEEDLES 3/16") 31G X 5 MM MISC 1 each by Does not apply route daily. 08/08/19   Johnson, Megan P, DO  isosorbide mononitrate (IMDUR) 60 MG 24 hr tablet TAKE ONE TABLET BY MOUTH AT BEDTIME 10/28/21   Johnson, Megan P, DO  JARDIANCE 25 MG TABS tablet Take 1 tablet (25 mg total) by mouth every morning. 12/06/21   Wynetta Emery, Megan P, DO  Lactobacillus (PROBIOTIC ACIDOPHILUS PO) Take 1 capsule by mouth daily at 12 noon.    [provider]  lidocaine (LIDODERM) 5 % Place 1 patch onto the skin daily. Remove & Discard patch within 12 hours or as directed by  MD Patient not taking: Reported on 02/24/2022 09/08/21   Park Liter P, DO  montelukast (SINGULAIR) 10 MG tablet Take 1 tablet (10 mg total) by mouth at bedtime. 12/06/21   Johnson, Megan P, DO  moxifloxacin (VIGAMOX) 0.5 % ophthalmic solution Apply to eye. Patient not taking: Reported on 02/24/2022 12/22/21   [provider]  MYRBETRIQ 50 MG TB24 tablet TAKE 1 TABLET BY MOUTH DAILY 01/31/21   Vaillancourt, Aldona Bar, PA-C  nitrofurantoin, macrocrystal-monohydrate, (MACROBID) 100 MG capsule Take 100 mg by mouth daily. 10/10/21   [provider]  nitroGLYCERIN (NITROSTAT) 0.4 MG SL tablet Place 1 tablet (0.4 mg total) under the tongue every 5 (five) minutes as needed for chest pain. Total of 3 doses Patient not taking: Reported on 02/24/2022 09/20/21   [name], MD  oseltamivir (TAMIFLU) 30 MG capsule Take 1 capsule (30 mg total) by mouth 2 (two) times daily. 02/24/22   Nena Polio, MD  oxyCODONE-acetaminophen (PERCOCET/ROXICET) 5-325 MG tablet TAKE 1/2 TO 1 TABLET BY MOUTH EVERY 8 HOURS AS NEEDED FOR SEVERE PAIN 12/22/21   Wynetta Emery, Megan P, DO  pantoprazole (PROTONIX) 40 MG tablet TAKE 1 TABLET BY MOUTH DAILY 12/06/21   Park Liter P, DO  prednisoLONE acetate (PRED FORTE) 1 % ophthalmic suspension SMARTSIG:In Eye(s) Patient not taking: Reported on 02/24/2022 12/22/21   [provider]  predniSONE (DELTASONE) 10 MG tablet 6 tabs in the AM day 1, 5 tabs day 2, decrease by 1 daily until gone 02/16/22   Johnson, Megan P, DO  Pumpkin Seed-Soy Germ (AZO BLADDER CONTROL/GO-LESS PO) Take 1 tablet by mouth in the morning and at bedtime.    [provider]  rosuvastatin (CRESTOR) 40 MG tablet Take 1 tablet (40 mg total) by mouth daily. 12/06/21   Johnson, Megan P, DO  senna (SENOKOT) 8.6 MG TABS tablet Take 8.6 mg by mouth daily as needed for mild constipation.    [provider]  SYNTHROID 150 MCG tablet TAKE ONE TABLET ON AN EMPTY STOMACH WITHA GLASS  OF WATER AT LEAST 30 TO 60 MINUTES BEFORE BREAKFAST 06/06/21   Johnson, Megan P, DO  trimethoprim (TRIMPEX) 100 MG tablet TAKE ONE TABLET BY MOUTH EVERY  DAY 10/14/21   Jon Billings, NP  UNABLE TO FIND Med Name: Energy, memory and mood enhancer    [provider]  ZINC CITRATE PO Take 1 tablet by mouth daily.    [provider]    Physical Exam   Triage Vital Signs: ED Triage Vitals  Enc Vitals Group     BP 03/07/22 1911 (!) 126/92     Pulse Rate 03/07/22 1911 (!) 119     Resp 03/07/22 1911 (!) 24     Temp 03/07/22 1911 98.8 F (37.1 C)     Temp Source 03/07/22 1911 Oral     SpO2 03/07/22 1911 93 %     Weight 03/07/22 1907 207 lb 3.7 oz (94 kg)     Height 03/07/22 1907 _0  (1.676 m)     Head Circumference --      Peak Flow --      Pain Score 03/07/22 1907 0     Pain Loc --      Pain Edu? --      Excl. in [place]? --     Most recent vital signs: Vitals:   03/08/22 0432 03/08/22 0515  BP: (!) 78/49   Pulse: (!) 109 (!) 106  Resp: 18   Temp: (!) 97.4 F (36.3 C)   SpO2: (!) 89% 100%    CONSTITUTIONAL: Alert and oriented x 3 and responds appropriately to questions. Well-appearing; well-nourished, drowsy but arousable to voice HEAD: Normocephalic, atraumatic EYES: Conjunctivae clear, pupils appear equal, sclera nonicteric ENT: normal nose; moist mucous membranes NECK: Supple, normal ROM CARD: Regular and tachycardic; S1 and S2 appreciated; no murmurs, no clicks, no rubs, no gallops RESP: Patient is slightly tachypneic, breath sounds clear and equal bilaterally; no wheezes, no rhonchi, no rales, she has hypoxia on room air.  Doing well on 4 L nasal cannula. ABD/GI: Normal bowel sounds; non-distended; soft, slightly tender to palpation in the right lower quadrant without guarding or rebound BACK: The back appears normal EXT: Normal ROM in all joints; no deformity noted, no edema; no cyanosis SKIN: Normal color for age and race; warm; no rash on exposed  skin NEURO: Moves all extremities equally, normal speech PSYCH: The patient's mood and manner are appropriate.   ED Results / Procedures / Treatments   LABS: (all labs ordered are listed, but only abnormal results are displayed) Labs Reviewed  RESP PANEL BY RT-PCR (RSV, FLU A&B, COVID)  [id number] - Abnormal; Notable for the following components:      Result Value   Influenza A by PCR POSITIVE (*)    All other components within normal limits  BASIC METABOLIC PANEL - Abnormal; Notable for the following components:   Potassium 5.3 (*)    Glucose, Bld 169 (*)    BUN 35 (*)    Creatinine, Ser 1.84 (*)    Calcium 8.6 (*)    GFR, Estimated 28 (*)    All other components within normal limits  CBC - Abnormal; Notable for the following components:   WBC 12.1 (*)    RBC 5.62 (*)    Hemoglobin 15.7 (*)    HCT 51.5 (*)    All other components within normal limits  BLOOD GAS, VENOUS - Abnormal; Notable for the following components:   pO2, Ven <31 (*)    All other components within normal limits  CBC - Abnormal; Notable for the following components:   WBC 10.6 (*)    All other components within normal limits  CULTURE, BLOOD (ROUTINE X 2)  CULTURE, BLOOD (ROUTINE X 2)  BRAIN NATRIURETIC PEPTIDE  PROCALCITONIN  MAGNESIUM  PROTIME-INR  CORTISOL-AM, BLOOD  BASIC METABOLIC PANEL  LACTIC ACID, PLASMA  LACTIC ACID, PLASMA  BLOOD GAS, ARTERIAL  CBG MONITORING, ED  TROPONIN I (HIGH SENSITIVITY)  TROPONIN I (HIGH SENSITIVITY)     EKG:  EKG Interpretation  Date/Time:  Tuesday March 07 2022 19:22:49 EST Ventricular Rate:  115 PR Interval:  122 QRS Duration: 82 QT Interval:  326 QTC Calculation: 450 R Axis:   -52 Text Interpretation: Sinus tachycardia with Premature atrial complexes Left axis deviation Left ventricular hypertrophy with repolarization abnormality ( R in aVL , Cornell product , Romhilt-Estes ) Cannot rule out Septal infarct (cited on or before 08-Jun-2021) Abnormal ECG  When compared with ECG of 24-Feb-2022 19:37, Premature ventricular complexes are no longer Present Questionable change in initial forces of Septal leads Confirmed by Pryor Curia [phone]) on 03/08/2022 1:11:46 AM         RADIOLOGY: My personal review and interpretation of imaging: CT head unremarkable.  CT abdomen pelvis shows no acute abnormality.  Chest x-ray concerning for viral pneumonia.  I have personally reviewed all radiology reports.   CT CHEST ABDOMEN PELVIS WO CONTRAST  Result Date: 03/08/2022 CLINICAL DATA:  Sepsis. EXAM: CT CHEST, ABDOMEN AND PELVIS WITHOUT CONTRAST TECHNIQUE: Multidetector CT imaging of the chest, abdomen and pelvis was performed following the standard protocol without IV contrast. RADIATION DOSE REDUCTION: This exam was performed according to the departmental dose-optimization program which includes automated exposure control, adjustment of the mA and/or kV according to patient size and/or use of iterative reconstruction technique. COMPARISON:  CT abdomen pelvis dated 01/11/2022. FINDINGS: Evaluation of this exam is limited in the absence of intravenous contrast. CT CHEST FINDINGS Cardiovascular: There is no cardiomegaly or pericardial effusion. Three-vessel coronary vascular calcification. Advanced atherosclerotic calcification of the thoracic aorta. Mildly dilated distal descending thoracic aorta measure up to 3.8 cm in caliber. The central pulmonary arteries are grossly unremarkable on this noncontrast CT. Mediastinum/Nodes: No hilar or mediastinal adenopathy. The esophagus is grossly unremarkable. No mediastinal fluid collection. Lungs/Pleura: Background of centrilobular emphysema. There are bibasilar subpleural atelectasis/scarring. Linear scarring in the left upper lobe. No consolidative changes. There is no pleural effusion or pneumothorax. The central airways are patent. Musculoskeletal: Subacute or old fractures of the left third and fourth ribs without healing  at this time. No acute osseous pathology. Lower thoracic spinal stimulator. CT ABDOMEN PELVIS FINDINGS No intra-abdominal free air or free fluid. Hepatobiliary: The liver is unremarkable. No biliary dilatation. Cholecystectomy. No retained calcified stone noted in the central CBD. Pancreas: Unremarkable. No pancreatic ductal dilatation or surrounding inflammatory changes. Spleen: Normal in size without focal abnormality. Adrenals/Urinary Tract: The adrenal glands are unremarkable. Moderate right renal parenchyma atrophy. Chronic mild right hydronephrosis with transition at the ureteropelvic junction may represent a stricture. No stone identified. There is no hydronephrosis or nephrolithiasis on the left. The visualized ureters and urinary bladder appear unremarkable. Stomach/Bowel: There is sigmoid diverticulosis without active inflammatory changes. There is a 3 cm duodenal diverticulum. There is no bowel obstruction. Appendectomy. Vascular/Lymphatic: Advanced aortoiliac atherosclerotic disease. The IVC is unremarkable. No portal venous gas. There is no adenopathy. Reproductive: The uterus is grossly unremarkable. No adnexal masses. Other: None Musculoskeletal: Osteopenia with degenerative changes of the spine. Multilevel disc spacer and posterior fusion. No acute osseous pathology. IMPRESSION: 1. No acute intrathoracic, abdominal, or pelvic pathology. 2. Chronic mild right hydronephrosis with transition at  the ureteropelvic junction may represent a stricture. No stone identified. 3. Sigmoid diverticulosis as well as duodenal diverticulum. No bowel obstruction. 4. Mildly dilated distal descending thoracic aorta measure up to 3.8 cm in caliber. 5. Aortic Atherosclerosis (ICD10-I70.0) and Emphysema (ICD10-J43.9). Electronically Signed   By: Anner Crete M.D.   On: 03/08/2022 01:05   CT HEAD WO CONTRAST (5MM)  Result Date: 03/08/2022 CLINICAL DATA:  Mental status change, unknown cause EXAM: CT HEAD WITHOUT  CONTRAST TECHNIQUE: Contiguous axial images were obtained from the base of the skull through the vertex without intravenous contrast. RADIATION DOSE REDUCTION: This exam was performed according to the departmental dose-optimization program which includes automated exposure control, adjustment of the mA and/or kV according to patient size and/or use of iterative reconstruction technique. COMPARISON:  11/22/2015 FINDINGS: Brain: Normal anatomic configuration. Parenchymal volume loss is commensurate with the patient's age. Mild periventricular white matter changes are present likely reflecting the sequela of small vessel ischemia. Left frontal cortical encephalomalacia is unchanged no abnormal intra or extra-axial mass lesion or fluid collection. No abnormal mass effect or midline shift. No evidence of acute intracranial hemorrhage or infarct. Ventricular size is normal. Cerebellum unremarkable. Vascular: No asymmetric hyperdense vasculature at the skull base. Skull: Intact Sinuses/Orbits: Paranasal sinuses are clear. Orbits are unremarkable. Other: Mastoid air cells and middle ear cavities are clear. IMPRESSION: 1. No acute intracranial hemorrhage or infarct. 2. Stable left frontal cortical encephalomalacia. 3. Stable senescent change. Electronically Signed   By: Fidela Salisbury M.D.   On: 03/08/2022 00:57   DG Chest Port 1 View  Result Date: 03/07/2022 CLINICAL DATA:  Shortness of breath EXAM: PORTABLE CHEST 1 VIEW COMPARISON:  CXR 02/24/22 FINDINGS: No pleural effusion. No pneumothorax. Unchanged cardiac and mediastinal contours. Tortuous thoracic aorta. Prominent bilateral interstitial opacities in the more focal opacity in the peripheral left upper lung. Displaced rib fractures. Visualized upper abdomen is unremarkable. Spinal nerve stimulator in place. Degenerative changes of the [facility] and glenohumeral joints. IMPRESSION: Prominent bilateral interstitial opacities with a more focal opacity in the peripheral left  upper lung. Findings are nonspecific, could represent findings related to atypical infection/small airways disease. Electronically Signed   By: Marin Roberts M.D.   On: 03/07/2022 19:49     PROCEDURES:  Critical Care performed: Yes, see critical care procedure note(s)   CRITICAL CARE Performed by: Cyril Mourning Joyell Emami   Total critical care time: 40 minutes  Critical care time was exclusive of separately billable procedures and treating other patients.  Critical care was necessary to treat or prevent imminent or life-threatening deterioration.  Critical care was time spent personally by me on the following activities: development of treatment plan with patient and/or surrogate as well as nursing, discussions with consultants, evaluation of patient's response to treatment, examination of patient, obtaining history from patient or surrogate, ordering and performing treatments and interventions, ordering and review of laboratory studies, ordering and review of radiographic studies, pulse oximetry and re-evaluation of patient's condition.   [name]1-3 Lead EKG Interpretation  Performed by: Aerilynn Goin, Delice Bison, DO Authorized by: Loisann Roach, Delice Bison, DO     Interpretation: abnormal     ECG rate:  115   ECG rate assessment: tachycardic     Rhythm: sinus tachycardia     Ectopy: PAC     Conduction: normal       IMPRESSION / MDM / ASSESSMENT AND PLAN / ED COURSE  I reviewed the triage vital signs and the nursing notes.    Patient here with drowsiness,  shortness of breath, cough and congestion.  Recently diagnosed with the flu.  Hypoxic here today and does not wear oxygen at home.  The patient is on the cardiac monitor to evaluate for evidence of arrhythmia and/or significant heart rate changes.   DIFFERENTIAL DIAGNOSIS (includes but not limited to):   Influenza, other viral URI, viral pneumonia, bacterial pneumonia, PE, ACS, intracranial hemorrhage, stroke, metabolic encephalopathy,  hypercapnia   Patient's presentation is most consistent with acute presentation with potential threat to life or bodily function.   PLAN: Workup initiated from triage.  Patient has a leukocytosis of 12,000.  Potassium slightly elevated at 5.3 and creatinine is slightly more elevated than normal at 1.8.  She does have some PACs.  Will give gentle IV hydration given history of CHF.  Patient's BNP is normal at 58.  Troponin x 2 is negative.  Chest x-ray reviewed and interpreted by myself and the radiologist is concerning for possible viral pneumonia.  Repeat COVID, flu and RSV swab pending.  Will obtain CT head, CT of the chest abdomen pelvis without contrast given history of contrast allergy.   MEDICATIONS GIVEN IN ED: Medications  oxyCODONE-acetaminophen (PERCOCET/ROXICET) 5-325 MG per tablet 1 tablet (has no administration in time range)  anastrozole (ARIMIDEX) tablet 1 mg (has no administration in time range)  rosuvastatin (CRESTOR) tablet 40 mg (has no administration in time range)  isosorbide mononitrate (IMDUR) 24 hr tablet 60 mg (60 mg Oral Given 03/08/22 0219)  nitroGLYCERIN (NITROSTAT) SL tablet 0.4 mg (has no administration in time range)  DULoxetine (CYMBALTA) DR capsule 120 mg (has no administration in time range)  insulin glargine-yfgn (SEMGLEE) injection 15 Units (has no administration in time range)  levothyroxine (SYNTHROID) tablet 150 mcg (has no administration in time range)  pantoprazole (PROTONIX) EC tablet 40 mg (has no administration in time range)  mirabegron ER (MYRBETRIQ) tablet 50 mg (has no administration in time range)  apixaban (ELIQUIS) tablet 2.5 mg (has no administration in time range)  benzonatate (TESSALON) capsule 200 mg (has no administration in time range)  lactated ringers infusion ( Intravenous New Bag/Given 03/08/22 0223)  cefTRIAXone (ROCEPHIN) 2 g in sodium chloride 0.9 % 100 mL IVPB (has no administration in time range)  azithromycin (ZITHROMAX) 500  mg in sodium chloride 0.9 % 250 mL IVPB (0 mg Intravenous Stopped 03/08/22 0440)  acetaminophen (TYLENOL) tablet 650 mg (has no administration in time range)    Or  acetaminophen (TYLENOL) suppository 650 mg (has no administration in time range)  ondansetron (ZOFRAN) tablet 4 mg (has no administration in time range)    Or  ondansetron (ZOFRAN) injection 4 mg (has no administration in time range)  insulin aspart (novoLOG) injection 0-15 Units ( Subcutaneous Not Given 03/08/22 0422)  albuterol (PROVENTIL) (2.5 MG/3ML) 0.083% nebulizer solution 2.5 mg (has no administration in time range)  sodium chloride 0.9 % bolus 1,000 mL (1,000 mLs Intravenous New Bag/Given 03/08/22 0509)  cefTRIAXone (ROCEPHIN) 2 g in sodium chloride 0.9 % 100 mL IVPB (0 g Intravenous Stopped 03/08/22 0220)  acetaminophen (TYLENOL) tablet 1,000 mg (1,000 mg Oral Given 03/08/22 0147)  ondansetron (ZOFRAN) injection 4 mg (4 mg Intravenous Given 03/08/22 0144)  sodium chloride 0.9 % bolus 500 mL (0 mLs Intravenous Stopped 03/08/22 0220)  lactated ringers bolus 500 mL (0 mLs Intravenous Stopped 03/08/22 0440)  sodium chloride 0.9 % bolus 1,000 mL (1,000 mLs Intravenous New Bag/Given 03/08/22 0447)  norepinephrine (LEVOPHED) 4-5 MG/250ML-% infusion SOLN (4 mg  New Bag/Given 03/08/22 0508)  ED COURSE: CT is reviewed and interpreted by myself and the radiologist and show no acute abnormality.  Patient is still positive for influenza.  I did cover her for community-acquired pneumonia with antibiotics until procalcitonin is back.  Will obtain cultures.  Given frequent PACs, will also add on a magnesium level.  Will discuss with hospitalist for admission for flu, hypoxia.   CONSULTS:  Consulted and discussed patient's case with hospitalist, Dr. Damita Dunnings.  I have recommended admission and consulting physician agrees and will place admission orders.  Patient (and family if present) agree with this plan.   I reviewed all nursing  notes, vitals, pertinent previous records.  All labs, EKGs, imaging ordered have been independently reviewed and interpreted by myself.    OUTSIDE RECORDS REVIEWED: Reviewed patient's last nephrology note September 2022.    4:38 AM  I was informed by nursing staff that patient's systolic blood pressure now in the 70s with MAP of 51, heart rate rising and oxygen requirement increasing down to 5 L.  I have ordered a liter of IV fluids, repeat chest x-ray.  She has received Rocephin and azithromycin for pneumonia.  Procalcitonin 0.47.  Cultures pending.  Nursing staff to update the hospitalist team for further orders.   5:03 AM  I was asked to come back and evaluate the patient due to Renee Pain, hospitalist NP being in an upstairs code with the patient and no RT available.  Patient hypotensive, tachycardic with increasing oxygen requirement.  I have ordered a second liter of IV fluids for a total of 2.5 L IV fluid bolus and asked nursing staff to start Levophed.  Will place her on high flow nasal cannula.  ABG has also been ordered when respiratory is available.  Our charge nurse in the ED is aware as well as Dr. Judd Gaudier with hospitalist service, Domingo Pulse Margaretann Loveless NP with critical care.  Repeat chest x-ray shows no significant change compared to previous.   5:18 AM  Pt stabilizing.  Seen by critical care and hospitalist service.  Patient will be moved to room 12.   FINAL CLINICAL IMPRESSION(S) / ED DIAGNOSES   Final diagnoses:  Influenza A  Viral pneumonia  Acute respiratory failure with hypoxia (HCC)  Hyperkalemia  Premature atrial contractions  Acute sepsis ([place])     Rx / DC Orders   ED Discharge Orders     None        Note:  This document was prepared using Dragon voice recognition software and may include unintentional dictation errors.   Yarimar Lavis, Delice Bison, DO 03/08/22 0125    Zeena Starkel, Delice Bison, DO 03/08/22 [place], Delice Bison, DO 03/08/22 [phone]

## 2022-03-08 NOTE — H&P (Signed)
History and Physical    Patient: Tammy [name] DOB: [birth year] DOA: 03/07/2022 DOS: the patient was seen and examined on 03/08/2022 PCP: [name], DO  Patient coming from: Home  Chief Complaint:  Chief Complaint  Patient presents with   Shortness of Breath   Weakness    HPI: Tammy [name] is a 76 y.o. female with medical history significant for Breast cancer s/p mastectomy and on anastrozole, lung cancer, CKD 3B, HTN, PAD, history of PE on Eliquis, frequent UTIs, DM on insulin, CAD, diagnosed with influenza A on 12/15, who presents to the ED with persistent shortness of breath from now associated with weakness and lethargy.  O2 sat on room air was 85%. ED course and data review: Afebrile, tachycardic to 119 and tachypneic to 24 with O2 sat 93% on 3 L and BP 126/92.  Labs with WBC 12,100, lactic acid and procalcitonin pending.  Troponin 12.  Creatinine 1.84 slightly above baseline of 1.7.  VBG unremarkable.  Respiratory panel positive for influenza A.EKG, personally viewed and interpreted showing sinus tachycardia at 115 with nonspecific ST-T wave changes.  CT chest abdomen and pelvis nonacute and showing the following: iMPRESSION: 1. No acute intrathoracic, abdominal, or pelvic pathology. 2. Chronic mild right hydronephrosis with transition at the ureteropelvic junction may represent a stricture. No stone identified. 3. Sigmoid diverticulosis as well as duodenal diverticulum. No bowel obstruction. 4. Mildly dilated distal descending thoracic aorta measure up to 3.8 cm in caliber. 5. Aortic Atherosclerosis (ICD10-I70.0) and Emphysema (ICD10-J43.9).    CT head nonacute  Patient started on IV fluids, ceftriaxone And hospitalist consulted for admission.   Review of Systems: As mentioned in the history of present illness. All other systems reviewed and are negative.  Past Medical History:  Diagnosis Date   Asthma    Benign neoplasm of colon    Breast cancer ([place])  08/2018   left breast   Cervical disc disease    "bulging" - no limitations per pt   Chronic diastolic CHF (congestive heart failure) ([place])    a. 07/2012 Echo: Nl EF. mild inferoseptal HK, Gr 2 DD, mild conc LVH, mild PR/TR, mild PAH; b. 08/2019 Echo: EF 55-60%, no rwma, Gr1 DD. Nl RV size/fxn.   CKD (chronic kidney disease), stage III (HCC)    COPD (chronic obstructive pulmonary disease) (HCC)    Coronary artery disease    a. 11/2011 Cath: LAD 40m/d, LCX min irregs, RCA 70p, 14m with L->R collats, EF 60%-->Med Rx; b. 03/2019 Cath: LM nl, LAD 40-62m. LCX large, mild diff dzs. OM1/2 nl. RCA known to be 16m. L-->R collats from LAD & LCX/OM.   Diabetes mellitus without complication (HCC)    Fusion of lumbar spine 12/22/2015   GERD (gastroesophageal reflux disease)    History of DVT (deep vein thrombosis)    History of tobacco abuse    a. Quit 2011.   Hyperlipidemia    Hypertension    Hypothyroidism    Lung cancer ([place])    Obesity    Palpitations    Personal history of radiation therapy    PONV (postoperative nausea and vomiting)    Pulmonary embolus ([place]) 12/25/2013   RLL. Presented with SOB and elevated d-dimer.    PVD (peripheral vascular disease) ([place])    a. PTA of right leg and left femoral artery with stenosis; b. 09/2020 ABI: R 0.93, L 0.88 - unchanged from prior study.   Stroke [facility]) 2015   TIAs - no deficits  Past Surgical History:  Procedure Laterality Date   ANGIOPLASTY / STENTING FEMORAL     PVD; angioplasty right leg and left femoral artery with stenosis.    APPENDECTOMY  12/2014   BACK SURGERY  03/2012   nerve stimulator inserted   BACK SURGERY     screws, rods replaced with new hardware.   BACK SURGERY  11/2016   BREAST LUMPECTOMY Left 08/26/2018   BREAST SURGERY     CARDIAC CATHETERIZATION  12/07/2011   Mid LAD 50%, distal LAD 50%, mid RCA 70%, distal RCA 100%    CARDIAC CATHETERIZATION  01/04/2011   100% occluded mid RCA with good collaterals from distal LAD,  normal LVEF.   CATARACT EXTRACTION Right 12/21/2021   CHOLECYSTECTOMY     COLONOSCOPY  2013   COLONOSCOPY WITH PROPOFOL N/A 05/24/2015   Procedure: COLONOSCOPY WITH PROPOFOL;  Surgeon: [name], MD;  Location: [place];  Service: Endoscopy;  Laterality: N/A;  Diabetic - oral meds   COLONOSCOPY WITH PROPOFOL N/A 01/28/2019   Procedure: COLONOSCOPY WITH PROPOFOL;  Surgeon: [name], MD;  Location: [facility];  Service: Endoscopy;  Laterality: N/A;   DIAGNOSTIC LAPAROSCOPY     ESOPHAGOGASTRODUODENOSCOPY (EGD) WITH PROPOFOL N/A 01/28/2019   Procedure: ESOPHAGOGASTRODUODENOSCOPY (EGD) WITH PROPOFOL;  Surgeon: [name], MD;  Location: [facility];  Service: Endoscopy;  Laterality: N/A;   LAPAROSCOPIC APPENDECTOMY N/A 01/06/2015   Procedure: APPENDECTOMY LAPAROSCOPIC;  Surgeon: [name], MD;  Location: [facility];  Service: General;  Laterality: N/A;   LEFT HEART CATH AND CORONARY ANGIOGRAPHY Left 04/04/2019   Procedure: LEFT HEART CATH AND CORONARY ANGIOGRAPHY;  Surgeon: [name], MD;  Location: [place] [facility];  Service: Cardiovascular;  Laterality: Left;   MASTECTOMY W/ SENTINEL NODE BIOPSY Left 08/26/2018   Procedure: PARTIAL MASTECTOMY WIDE EXCISION WITH SENTINEL LYMPH NODE BIOPSY LEFT;  Surgeon: [name], MD;  Location: [facility];  Service: General;  Laterality: Left;   POLYPECTOMY  05/24/2015   Procedure: POLYPECTOMY;  Surgeon: [name], MD;  Location: [place];  Service: Endoscopy;;   REPLACEMENT TOTAL KNEE Right    RIGHT/LEFT HEART CATH AND CORONARY ANGIOGRAPHY Bilateral 09/26/2021   Procedure: RIGHT/LEFT HEART CATH AND CORONARY ANGIOGRAPHY;  Surgeon: Wellington Hampshire, MD;  Location: [place] [facility];  Service: Cardiovascular;  Laterality: Bilateral;   THYROIDECTOMY     TOTAL KNEE ARTHROPLASTY Right 11/13/2019   TRIGGER FINGER RELEASE     Social History:  reports that she quit smoking about  14 years ago. Her smoking use included cigarettes. She has a 7.50 pack-year smoking history. She has never used smokeless tobacco. She reports that she does not drink alcohol and does not use drugs.  Allergies  Allergen Reactions   Hydrocodone Other (See Comments)    Unresponsive   Aleve [Naproxen Sodium]     Hives & itching    Contrast Media [Iodinated Contrast Media]     Pt avoids due to kidney issues   Ioxaglate Other (See Comments)    (iodine) Pt avoids due to kidney issues    Oxycodone Other (See Comments)    hallucinations   Ranexa [Ranolazine]     Sick on stomach   Sulfa Antibiotics Other (See Comments)    Pt avoids due to kidney issues   Tramadol Other (See Comments)    nightmares Other reaction(s): Hallucination, Other (See Comments) nightmares     Family History  Problem Relation Age of Onset   Cancer Mother  colon   Hypertension Father    Heart disease Father    Heart attack Father    Diabetes Father    Cancer Sister        lung   COPD Sister    COPD Sister    Heart attack Brother    Diabetes Brother    Hypertension Brother    Heart disease Brother    Heart attack Brother    Diabetes Brother    Hypertension Brother    Heart disease Brother    Heart attack Brother    Diabetes Brother    Hypertension Brother    Heart disease Brother    Colon cancer Brother 37   Lung cancer Brother    Breast cancer Neg Hx     Prior to Admission medications   Medication Sig Start Date End Date Taking? Authorizing Provider  anastrozole (ARIMIDEX) 1 MG tablet TAKE 1 TABLET BY MOUTH DAILY 12/14/21  Yes Cammie Sickle, MD  Calcium Carb-Cholecalciferol (CALCIUM 600 + D) 600-200 MG-UNIT TABS Take 1 tablet by mouth daily.    Yes [provider]  cholecalciferol (VITAMIN D3) 25 MCG (1000 UT) tablet Take 1,000 Units by mouth daily.   Yes [provider]  DULoxetine (CYMBALTA) 60 MG capsule TAKE 2 CAPSULES BY MOUTH EVERY DAY 09/09/21  Yes Johnson,  Megan P, DO  ELIQUIS 2.5 MG TABS tablet TAKE ONE TABLET BY MOUTH TWICE DAILY 10/28/21  Yes Johnson, Megan P, DO  gabapentin (NEURONTIN) 400 MG capsule Take 400 mg by mouth 3 (three) times daily. 01/30/22  Yes [provider]  glipiZIDE (GLUCOTROL) 5 MG tablet TAKE 1 TABLET BY MOUTH DAILY BEFORE BREAKFAST 12/06/21  Yes Johnson, Megan P, DO  insulin degludec (TRESIBA FLEXTOUCH) 100 UNIT/ML FlexTouch Pen Inject 40 Units into the skin daily. 11/02/21  Yes Johnson, Megan P, DO  isosorbide mononitrate (IMDUR) 60 MG 24 hr tablet TAKE ONE TABLET BY MOUTH AT BEDTIME 10/28/21  Yes Johnson, Megan P, DO  JARDIANCE 25 MG TABS tablet Take 1 tablet (25 mg total) by mouth every morning. 12/06/21  Yes Johnson, Megan P, DO  Lactobacillus (PROBIOTIC ACIDOPHILUS PO) Take 1 capsule by mouth daily at 12 noon.   Yes [provider]  montelukast (SINGULAIR) 10 MG tablet Take 1 tablet (10 mg total) by mouth at bedtime. 12/06/21  Yes Johnson, Megan P, DO  MYRBETRIQ 50 MG TB24 tablet TAKE 1 TABLET BY MOUTH DAILY 01/31/21  Yes Vaillancourt, Samantha, PA-C  nitrofurantoin, macrocrystal-monohydrate, (MACROBID) 100 MG capsule Take 100 mg by mouth daily. 10/10/21  Yes [provider]  pantoprazole (PROTONIX) 40 MG tablet TAKE 1 TABLET BY MOUTH DAILY 12/06/21  Yes Johnson, Megan P, DO  rosuvastatin (CRESTOR) 40 MG tablet Take 1 tablet (40 mg total) by mouth daily. 12/06/21  Yes Johnson, Megan P, DO  SYNTHROID 150 MCG tablet TAKE ONE TABLET ON AN EMPTY STOMACH WITHA GLASS OF WATER AT LEAST 30 TO 60 MINUTES BEFORE BREAKFAST 06/06/21  Yes Johnson, Megan P, DO  acyclovir (ZOVIRAX) 800 MG tablet Take 800 mg by mouth 4 (four) times daily. 10/21/21   [provider]  albuterol (PROVENTIL) (2.5 MG/3ML) 0.083% nebulizer solution Take 3 mLs (2.5 mg total) by nebulization every 6 (six) hours as needed for wheezing or shortness of breath. 07/14/21   Chesley Mires, MD  albuterol (VENTOLIN HFA) 108 (90 Base) MCG/ACT inhaler  Inhale 2 puffs into the lungs every 6 (six) hours as needed for wheezing or shortness of breath. 01/13/21  Johnson, Megan P, DO  benzonatate (TESSALON) 200 MG capsule Take 1 capsule (200 mg total) by mouth 2 (two) times daily as needed for cough. 02/16/22   Johnson, Megan P, DO  CRANBERRY PO Take 2 capsules by mouth daily.    [provider]  cyclobenzaprine (FLEXERIL) 5 MG tablet Take 1 tablet (5 mg total) by mouth 3 (three) times daily as needed for muscle spasms. 08/23/21   Vigg, Avanti, MD  Dulaglutide (TRULICITY) 4.5 ZS/0.1UX SOPN INJECT 4.5 MG INTO THE SKIN ONCE A WEEK 02/17/22   Johnson, Megan P, DO  fluticasone (FLONASE) 50 MCG/ACT nasal spray Place 1 spray into both nostrils daily. 07/14/21   Chesley Mires, MD  gabapentin (NEURONTIN) 300 MG capsule Take 2 capsules (600 mg total) by mouth 3 (three) times daily. Patient not taking: Reported on 02/24/2022 11/08/21   Park Liter P, DO  lidocaine (LIDODERM) 5 % Place 1 patch onto the skin daily. Remove & Discard patch within 12 hours or as directed by MD Patient not taking: Reported on 02/24/2022 09/08/21   Park Liter P, DO  moxifloxacin (VIGAMOX) 0.5 % ophthalmic solution Apply to eye. Patient not taking: Reported on 02/24/2022 12/22/21   [provider]  nitroGLYCERIN (NITROSTAT) 0.4 MG SL tablet Place 1 tablet (0.4 mg total) under the tongue every 5 (five) minutes as needed for chest pain. Total of 3 doses 09/20/21   [name], MD  oseltamivir (TAMIFLU) 30 MG capsule Take 1 capsule (30 mg total) by mouth 2 (two) times daily. Patient not taking: Reported on 03/08/2022 02/24/22   Nena Polio, MD  oxyCODONE-acetaminophen (PERCOCET/ROXICET) 5-325 MG tablet TAKE 1/2 TO 1 TABLET BY MOUTH EVERY 8 HOURS AS NEEDED FOR SEVERE PAIN 12/22/21   Park Liter P, DO  prednisoLONE acetate (PRED FORTE) 1 % ophthalmic suspension SMARTSIG:In Eye(s) Patient not taking: Reported on 02/24/2022 12/22/21   [provider]   predniSONE (DELTASONE) 10 MG tablet 6 tabs in the AM day 1, 5 tabs day 2, decrease by 1 daily until gone 02/16/22   Johnson, Megan P, DO  Pumpkin Seed-Soy Germ (AZO BLADDER CONTROL/GO-LESS PO) Take 1 tablet by mouth in the morning and at bedtime.    [provider]  senna (SENOKOT) 8.6 MG TABS tablet Take 8.6 mg by mouth daily as needed for mild constipation.    [provider]  trimethoprim (TRIMPEX) 100 MG tablet TAKE ONE TABLET BY MOUTH EVERY DAY 10/14/21   Jon Billings, NP  UNABLE TO FIND Med Name: Energy, memory and mood enhancer    [provider]  ZINC CITRATE PO Take 1 tablet by mouth daily.    [provider]    Physical Exam: Vitals:   03/07/22 2157 03/07/22 2319 03/07/22 2330 03/08/22 0000  BP: 112/76 110/68 121/89 114/69  Pulse: (!) 116 (!) 108 (!) 115 (!) 115  Resp: (!) 26 19 (!) 21 (!) 21  Temp:  98.9 F (37.2 C)    TempSrc:      SpO2: (!) 89% 96% 96% 95%  Weight:      Height:       Physical Exam Vitals and nursing note reviewed.  Constitutional:      General: She is not in acute distress.    Appearance: She is ill-appearing.  HENT:     Head: Normocephalic and atraumatic.  Cardiovascular:     Rate and Rhythm: Regular rhythm. Tachycardia present.     Heart sounds: Normal heart sounds.  Pulmonary:  Effort: Tachypnea present.     Breath sounds: Wheezing and rales present.  Abdominal:     Palpations: Abdomen is soft.     Tenderness: There is no abdominal tenderness.  Neurological:     Mental Status: Mental status is at baseline.     Labs on Admission: I have personally reviewed following labs and imaging studies  CBC: Recent Labs  Lab 03/07/22 1917  WBC 12.1*  HGB 15.7*  HCT 51.5*  MCV 91.6  PLT 947   Basic Metabolic Panel: Recent Labs  Lab 03/06/22 2335 03/07/22 1917  NA  --  140  K  --  5.3*  CL  --  109  CO2  --  22  GLUCOSE  --  169*  BUN  --  35*  CREATININE  --  1.84*  CALCIUM  --  8.6*  MG  2.1  --    GFR: Estimated Creatinine Clearance: 29.1 mL/min (A) (by C-G formula based on SCr of 1.84 mg/dL (H)). Liver Function Tests: No results for input(s): "AST", "ALT", "ALKPHOS", "BILITOT", "PROT", "ALBUMIN" in the last 168 hours. No results for input(s): "LIPASE", "AMYLASE" in the last 168 hours. No results for input(s): "AMMONIA" in the last 168 hours. Coagulation Profile: No results for input(s): "INR", "PROTIME" in the last 168 hours. Cardiac Enzymes: No results for input(s): "CKTOTAL", "CKMB", "CKMBINDEX", "TROPONINI" in the last 168 hours. BNP (last 3 results) No results for input(s): "PROBNP" in the last 8760 hours. HbA1C: No results for input(s): "HGBA1C" in the last 72 hours. CBG: No results for input(s): "GLUCAP" in the last 168 hours. Lipid Profile: No results for input(s): "CHOL", "HDL", "LDLCALC", "TRIG", "CHOLHDL", "LDLDIRECT" in the last 72 hours. Thyroid Function Tests: No results for input(s): "TSH", "T4TOTAL", "FREET4", "T3FREE", "THYROIDAB" in the last 72 hours. Anemia Panel: No results for input(s): "VITAMINB12", "FOLATE", "FERRITIN", "TIBC", "IRON", "RETICCTPCT" in the last 72 hours. Urine analysis:    Component Value Date/Time   COLORURINE YELLOW (A) 02/24/2022 2314   APPEARANCEUR CLOUDY (A) 02/24/2022 2314   APPEARANCEUR Cloudy (A) 09/08/2021 0950   LABSPEC 1.025 02/24/2022 2314   PHURINE 5.0 02/24/2022 2314   GLUCOSEU >=500 (A) 02/24/2022 2314   HGBUR SMALL (A) 02/24/2022 2314   BILIRUBINUR NEGATIVE 02/24/2022 2314   BILIRUBINUR Negative 09/08/2021 0950   KETONESUR 5 (A) 02/24/2022 2314   PROTEINUR 100 (A) 02/24/2022 2314   UROBILINOGEN 0.2 06/24/2015 0919   NITRITE POSITIVE (A) 02/24/2022 2314   LEUKOCYTESUR LARGE (A) 02/24/2022 2314    Radiological Exams on Admission: CT CHEST ABDOMEN PELVIS WO CONTRAST  Result Date: 03/08/2022 CLINICAL DATA:  Sepsis. EXAM: CT CHEST, ABDOMEN AND PELVIS WITHOUT CONTRAST TECHNIQUE: Multidetector CT imaging  of the chest, abdomen and pelvis was performed following the standard protocol without IV contrast. RADIATION DOSE REDUCTION: This exam was performed according to the departmental dose-optimization program which includes automated exposure control, adjustment of the mA and/or kV according to patient size and/or use of iterative reconstruction technique. COMPARISON:  CT abdomen pelvis dated 01/11/2022. FINDINGS: Evaluation of this exam is limited in the absence of intravenous contrast. CT CHEST FINDINGS Cardiovascular: There is no cardiomegaly or pericardial effusion. Three-vessel coronary vascular calcification. Advanced atherosclerotic calcification of the thoracic aorta. Mildly dilated distal descending thoracic aorta measure up to 3.8 cm in caliber. The central pulmonary arteries are grossly unremarkable on this noncontrast CT. Mediastinum/Nodes: No hilar or mediastinal adenopathy. The esophagus is grossly unremarkable. No mediastinal fluid collection. Lungs/Pleura: Background of centrilobular emphysema. There are bibasilar subpleural  atelectasis/scarring. Linear scarring in the left upper lobe. No consolidative changes. There is no pleural effusion or pneumothorax. The central airways are patent. Musculoskeletal: Subacute or old fractures of the left third and fourth ribs without healing at this time. No acute osseous pathology. Lower thoracic spinal stimulator. CT ABDOMEN PELVIS FINDINGS No intra-abdominal free air or free fluid. Hepatobiliary: The liver is unremarkable. No biliary dilatation. Cholecystectomy. No retained calcified stone noted in the central CBD. Pancreas: Unremarkable. No pancreatic ductal dilatation or surrounding inflammatory changes. Spleen: Normal in size without focal abnormality. Adrenals/Urinary Tract: The adrenal glands are unremarkable. Moderate right renal parenchyma atrophy. Chronic mild right hydronephrosis with transition at the ureteropelvic junction may represent a stricture. No  stone identified. There is no hydronephrosis or nephrolithiasis on the left. The visualized ureters and urinary bladder appear unremarkable. Stomach/Bowel: There is sigmoid diverticulosis without active inflammatory changes. There is a 3 cm duodenal diverticulum. There is no bowel obstruction. Appendectomy. Vascular/Lymphatic: Advanced aortoiliac atherosclerotic disease. The IVC is unremarkable. No portal venous gas. There is no adenopathy. Reproductive: The uterus is grossly unremarkable. No adnexal masses. Other: None Musculoskeletal: Osteopenia with degenerative changes of the spine. Multilevel disc spacer and posterior fusion. No acute osseous pathology. IMPRESSION: 1. No acute intrathoracic, abdominal, or pelvic pathology. 2. Chronic mild right hydronephrosis with transition at the ureteropelvic junction may represent a stricture. No stone identified. 3. Sigmoid diverticulosis as well as duodenal diverticulum. No bowel obstruction. 4. Mildly dilated distal descending thoracic aorta measure up to 3.8 cm in caliber. 5. Aortic Atherosclerosis (ICD10-I70.0) and Emphysema (ICD10-J43.9). Electronically Signed   By: Anner Crete M.D.   On: 03/08/2022 01:05   CT HEAD WO CONTRAST (5MM)  Result Date: 03/08/2022 CLINICAL DATA:  Mental status change, unknown cause EXAM: CT HEAD WITHOUT CONTRAST TECHNIQUE: Contiguous axial images were obtained from the base of the skull through the vertex without intravenous contrast. RADIATION DOSE REDUCTION: This exam was performed according to the departmental dose-optimization program which includes automated exposure control, adjustment of the mA and/or kV according to patient size and/or use of iterative reconstruction technique. COMPARISON:  11/22/2015 FINDINGS: Brain: Normal anatomic configuration. Parenchymal volume loss is commensurate with the patient's age. Mild periventricular white matter changes are present likely reflecting the sequela of small vessel ischemia. Left  frontal cortical encephalomalacia is unchanged no abnormal intra or extra-axial mass lesion or fluid collection. No abnormal mass effect or midline shift. No evidence of acute intracranial hemorrhage or infarct. Ventricular size is normal. Cerebellum unremarkable. Vascular: No asymmetric hyperdense vasculature at the skull base. Skull: Intact Sinuses/Orbits: Paranasal sinuses are clear. Orbits are unremarkable. Other: Mastoid air cells and middle ear cavities are clear. IMPRESSION: 1. No acute intracranial hemorrhage or infarct. 2. Stable left frontal cortical encephalomalacia. 3. Stable senescent change. Electronically Signed   By: Fidela Salisbury M.D.   On: 03/08/2022 00:57   DG Chest Port 1 View  Result Date: 03/07/2022 CLINICAL DATA:  Shortness of breath EXAM: PORTABLE CHEST 1 VIEW COMPARISON:  CXR 02/24/22 FINDINGS: No pleural effusion. No pneumothorax. Unchanged cardiac and mediastinal contours. Tortuous thoracic aorta. Prominent bilateral interstitial opacities in the more focal opacity in the peripheral left upper lung. Displaced rib fractures. Visualized upper abdomen is unremarkable. Spinal nerve stimulator in place. Degenerative changes of the [place] and glenohumeral joints. IMPRESSION: Prominent bilateral interstitial opacities with a more focal opacity in the peripheral left upper lung. Findings are nonspecific, could represent findings related to atypical infection/small airways disease. Electronically Signed   By: Madison Hickman  Shearon Stalls M.D.   On: 03/07/2022 19:49     Data Reviewed: Relevant notes from primary care and specialist visits, past discharge summaries as available in EHR, including Care Everywhere. Prior diagnostic testing as pertinent to current admission diagnoses Updated medications and problem lists for reconciliation ED course, including vitals, labs, imaging, treatment and response to treatment Triage notes, nursing and pharmacy notes and ED provider's notes Notable results as noted  in HPI   Assessment and Plan: * Acute respiratory failure with hypoxia ([place]) Influenza A with pneumonia Sepsis Patient was diagnosed with influenza A on 12/15 so suspecting bacterial superinfection given persistent symptoms now with hypoxia Sepsis criteria include tachycardia and tachypnea with leukocytosis, AMS  Sepsis fluids Continue Rocephin and azithromycin Supplemental oxygen to keep sats over 92% Antitussives, incentive spirometry and supportive care  Acute metabolic encephalopathy Secondary to sepsis Will keep n.p.o. Aspiration and fall precautions Neurologic checks  Coronary atherosclerosis of native coronary artery No complaints of chest pain and troponin negative and EKG nonacute Continue Imdur, nitroglycerin as needed and rosuvastatin  Chronic anticoagulation History of PE Continue apixaban  Stage 3b chronic kidney disease ([place]) Renal function at baseline  Left breast cancer s/p mastectomy Continue Arimidex  Diabetes mellitus, type II (HCC) Low-dose basal insulin Sliding scale coverage  Hyperkalemia Mild elevation of potassium to 5.3 Being hydrated expecting improvement  COPD (chronic obstructive pulmonary disease) (HCC) DuoNebs as needed  Fatty liver disease, nonalcoholic No acute issues  Essential hypertension Normotensive.  Chronic back pain Continue duloxetine, as needed oxycodone.  Will hold Neurontin and Flexeril until more alert        DVT prophylaxis: Apixaban  Consults: none  Advance Care Planning:   Code Status: Prior   Family Communication: none  Disposition Plan: Back to previous home environment  Severity of Illness: The appropriate patient status for this patient is INPATIENT. Inpatient status is judged to be reasonable and necessary in order to provide the required intensity of service to ensure the patient's safety. The patient's presenting symptoms, physical exam findings, and initial radiographic and laboratory data  in the context of their chronic comorbidities is felt to place them at high risk for further clinical deterioration. Furthermore, it is not anticipated that the patient will be medically stable for discharge from the hospital within 2 midnights of admission.   * I certify that at the point of admission it is my clinical judgment that the patient will require inpatient hospital care spanning beyond 2 midnights from the point of admission due to high intensity of service, high risk for further deterioration and high frequency of surveillance required.*  Author: Athena Masse, MD 03/08/2022 1:41 AM  For on call review [email or web address].

## 2022-03-08 NOTE — ED Notes (Signed)
This RN notified Dr. Alfredia Ferguson that pt's BGL is 87, and her implanted monitor states her BGL is 66. Pt. Currently has NPO order. Requested change in pt's PO status, or order for D50. Awaiting orders.

## 2022-03-08 NOTE — Assessment & Plan Note (Signed)
Low-dose basal insulin Sliding scale coverage

## 2022-03-08 NOTE — ED Notes (Addendum)
Pt had diarrhea. Pt cleaned and per care preformed.

## 2022-03-08 NOTE — Assessment & Plan Note (Signed)
Continue duloxetine, as needed oxycodone.  Will hold Neurontin and Flexeril until more alert

## 2022-03-08 NOTE — Assessment & Plan Note (Signed)
Mild elevation of potassium to 5.3 Being hydrated expecting improvement

## 2022-03-08 NOTE — Assessment & Plan Note (Signed)
DuoNebs as needed 

## 2022-03-08 NOTE — Assessment & Plan Note (Signed)
Continue Arimidex

## 2022-03-08 NOTE — Assessment & Plan Note (Addendum)
Influenza A with pneumonia Sepsis Patient was diagnosed with influenza A on 12/15 so suspecting bacterial superinfection given persistent symptoms now with hypoxia Sepsis criteria include tachycardia and tachypnea with leukocytosis, AMS  Sepsis fluids Continue Rocephin and azithromycin Supplemental oxygen to keep sats over 92% Antitussives, incentive spirometry and supportive care

## 2022-03-08 NOTE — Assessment & Plan Note (Signed)
Secondary to sepsis Will keep n.p.o. Aspiration and fall precautions Neurologic checks

## 2022-03-08 NOTE — Progress Notes (Signed)
Care started prior to midnight in the emergency room and patient was admitted early this morning after midnight by Dr. Judd Gaudier and I am in current agreement with her assessment and plan.  Additional changes to the plan of care have been made accordingly.  The patient is an obese Caucasian female the past medical history significant for but not due to breast cancer status postmastectomy on anastrozole, history of lung cancer, history of chronic kidney disease stage IIIb, hypertension, PAD, history of PE on anticoagulation with apixaban, history of frequent urinary tract infections, history of diabetes mellitus type 2 on insulin, CAD as well as other comorbidities who was recently diagnosed with influenza A on 02/24/2022 and presented to the ED with persistent shortness of breath associate with weakness and lethargy.  On room air she was saturating 85%.  In the ED she is found to be tachycardic, tachypneic and had to be placed on supplemental oxygen via nasal cannula and blood pressure was low and dropped and had to be placed on Levophed short-term.  WBC was elevated and creatinine was slightly higher than her baseline and EKG was done that showed nonspecific ST-T wave changes.  She underwent a CT scan of the abdomen chest abdomen pelvis which showed no acute intrathoracic, abdominal or pelvic pathology but she did also have some chronic mild right hydronephrosis with transition at the ureteropelvic junction that may just represent stricture as well as sigmoidal diverticulosis as well as a duodenal diverticulum.  She was noted to have aortic atherosclerosis as well as emphysema and a mildly dilated distal descending thoracic aorta measuring 3.8 cm in caliber.  She also had a head CT scan which was done to acute she was given IV fluids, ceftriaxone and the hospitalist with consult for admission of this patient to the hospital.  Currently she is being admitted and treated for the following but not limited  to:  Acute respiratory failure with hypoxia ([place]) Influenza A with pneumonia Septic Shock 2/2 to Influenza A with PNA -Patient was diagnosed with influenza A on 12/15 so suspecting bacterial superinfection given persistent symptoms now with hypoxia -Sepsis criteria include tachycardia and tachypnea with leukocytosis, AMS and had to be placed on short term Levophed  -Sepsis fluids initiated  -Continue Rocephin and azithromycin -Supplemental oxygen to keep sats over 92% -Antitussives, incentive spirometry and supportive care -SpO2: 100 % O2 Flow Rate (L/min): 2 L/min (per dr. Alfredia Ferguson, weaning pt. off O2.) -Continuous pulse Oximetry and Maintain O2 saturations >90% -Continuous pulse oximetry maintain O2 saturation greater 75%   Acute metabolic encephalopathy -Secondary to sepsis -Was kept n.p.o. now that she is much more awake and alert will place on her diet -ABG    Component Value Date/Time   HCO3 24.7 03/08/2022 0630   ACIDBASEDEF 3.8 (H) 03/08/2022 0630   O2SAT 42.5 03/08/2022 0630    Aspiration and fall precautions Neurologic checks   Coronary atherosclerosis of native coronary artery -No complaints of chest pain and troponin negative and -EKG nonacute -Continue Imdur, nitroglycerin as needed and rosuvastatin   Chronic Anticoagulation History of PE -Continue apixaban   Stage 3b chronic kidney disease ([place]) -Renal function at baseline Recent Labs  Lab 02/24/22 1912 03/07/22 1917 03/08/22 0424  BUN 25* 35* 40*  CREATININE 1.58* 1.84* 1.84*  -Baseline creatinine is around 1.5-1.7 -Avoid nephrotoxic medications, contrast dyes, hypotension and dehydration to ensure adequate renal perfusion and will need to renally adjust medications -Repeat CMP in a.m.   Left breast cancer s/p mastectomy -Continue  Arimidex   Diabetes mellitus, type II (HCC) -Low-dose basal insulin -Sliding scale coverage   Hyperkalemia -Mild elevation of potassium to 5.3 Being hydrated expecting  improvement and as expected improved to 4.5   COPD (chronic obstructive pulmonary disease) (HCC) -DuoNebs as needed -Will add Xopenex and Atrovent as well as guaifenesin, flutter valve and incentive spirometry   Fatty liver disease, nonalcoholic -No acute issues   Essential hypertension -> Hypoenstion -Normotensive initially but then had attention to the point where she ended up on some low-dose Levophed -Avoid further hypertensive medications -Continue monitor blood pressures per protocol and was given fluid bolus of 500 mL this morning   Chronic back pain -Continue duloxetine, as needed oxycodone.  Will hold Neurontin and Flexeril until more alert  Hypoalbuminemia -The patient albumin level is now 2.3 -Continue to monitor and trend and repeat CMP in a.m.  Obesity -Complicates overall prognosis and care -Estimated body mass index is 33.45 kg/m as calculated from the following:   Height as of this encounter: 5\' 6"  (1.676 m).   Weight as of this encounter: 94 kg.  -Weight Loss and Dietary Counseling given   Will continue to monitor the patient's clinical response to intervention and repeat blood work and imaging in the a.m. and she will need an ambulatory home O2 screen prior to discharge

## 2022-03-08 NOTE — Assessment & Plan Note (Signed)
No acute issues.

## 2022-03-08 NOTE — ED Notes (Signed)
Paged Dr. Damita Dunnings

## 2022-03-08 NOTE — Assessment & Plan Note (Signed)
Normotensive 

## 2022-03-08 NOTE — Assessment & Plan Note (Signed)
Renal function at baseline 

## 2022-03-09 ENCOUNTER — Other Ambulatory Visit: Payer: Self-pay

## 2022-03-09 ENCOUNTER — Inpatient Hospital Stay: Payer: Medicare Other

## 2022-03-09 DIAGNOSIS — R000 Tachycardia, unspecified: Secondary | ICD-10-CM | POA: Diagnosis not present

## 2022-03-09 DIAGNOSIS — G8929 Other chronic pain: Secondary | ICD-10-CM

## 2022-03-09 DIAGNOSIS — I25110 Atherosclerotic heart disease of native coronary artery with unstable angina pectoris: Secondary | ICD-10-CM | POA: Diagnosis not present

## 2022-03-09 DIAGNOSIS — J9601 Acute respiratory failure with hypoxia: Secondary | ICD-10-CM | POA: Diagnosis not present

## 2022-03-09 DIAGNOSIS — G9341 Metabolic encephalopathy: Secondary | ICD-10-CM | POA: Diagnosis not present

## 2022-03-09 DIAGNOSIS — E875 Hyperkalemia: Secondary | ICD-10-CM

## 2022-03-09 DIAGNOSIS — J09X1 Influenza due to identified novel influenza A virus with pneumonia: Secondary | ICD-10-CM | POA: Diagnosis not present

## 2022-03-09 DIAGNOSIS — M549 Dorsalgia, unspecified: Secondary | ICD-10-CM

## 2022-03-09 LAB — CBC WITH DIFFERENTIAL/PLATELET
Abs Immature Granulocytes: 0.04 10*3/uL (ref 0.00–0.07)
Basophils Absolute: 0 10*3/uL (ref 0.0–0.1)
Basophils Relative: 0 %
Eosinophils Absolute: 0.2 10*3/uL (ref 0.0–0.5)
Eosinophils Relative: 2 %
HCT: 40.6 % (ref 36.0–46.0)
Hemoglobin: 12.4 g/dL (ref 12.0–15.0)
Immature Granulocytes: 1 %
Lymphocytes Relative: 14 %
Lymphs Abs: 1.1 10*3/uL (ref 0.7–4.0)
MCH: 28 pg (ref 26.0–34.0)
MCHC: 30.5 g/dL (ref 30.0–36.0)
MCV: 91.6 fL (ref 80.0–100.0)
Monocytes Absolute: 0.8 10*3/uL (ref 0.1–1.0)
Monocytes Relative: 10 %
Neutro Abs: 5.8 10*3/uL (ref 1.7–7.7)
Neutrophils Relative %: 73 %
Platelets: 222 10*3/uL (ref 150–400)
RBC: 4.43 MIL/uL (ref 3.87–5.11)
RDW: 15.4 % (ref 11.5–15.5)
WBC: 8 10*3/uL (ref 4.0–10.5)
nRBC: 0 % (ref 0.0–0.2)

## 2022-03-09 LAB — D-DIMER, QUANTITATIVE: D-Dimer, Quant: 2.08 ug/mL-FEU — ABNORMAL HIGH (ref 0.00–0.50)

## 2022-03-09 LAB — CBG MONITORING, ED
Glucose-Capillary: 101 mg/dL — ABNORMAL HIGH (ref 70–99)
Glucose-Capillary: 108 mg/dL — ABNORMAL HIGH (ref 70–99)
Glucose-Capillary: 145 mg/dL — ABNORMAL HIGH (ref 70–99)
Glucose-Capillary: 86 mg/dL (ref 70–99)
Glucose-Capillary: 97 mg/dL (ref 70–99)

## 2022-03-09 LAB — COMPREHENSIVE METABOLIC PANEL
ALT: 17 U/L (ref 0–44)
AST: 27 U/L (ref 15–41)
Albumin: 2.3 g/dL — ABNORMAL LOW (ref 3.5–5.0)
Alkaline Phosphatase: 67 U/L (ref 38–126)
Anion gap: 7 (ref 5–15)
BUN: 21 mg/dL (ref 8–23)
CO2: 22 mmol/L (ref 22–32)
Calcium: 7.4 mg/dL — ABNORMAL LOW (ref 8.9–10.3)
Chloride: 112 mmol/L — ABNORMAL HIGH (ref 98–111)
Creatinine, Ser: 1.24 mg/dL — ABNORMAL HIGH (ref 0.44–1.00)
GFR, Estimated: 45 mL/min — ABNORMAL LOW (ref >60)
Glucose, Bld: 149 mg/dL — ABNORMAL HIGH (ref 70–99)
Potassium: 4.3 mmol/L (ref 3.5–5.1)
Sodium: 141 mmol/L (ref 135–145)
Total Bilirubin: 0.4 mg/dL (ref 0.3–1.2)
Total Protein: 5.7 g/dL — ABNORMAL LOW (ref 6.5–8.1)

## 2022-03-09 LAB — PHOSPHORUS: Phosphorus: 2 mg/dL — ABNORMAL LOW (ref 2.5–4.6)

## 2022-03-09 LAB — MAGNESIUM: Magnesium: 1.5 mg/dL — ABNORMAL LOW (ref 1.7–2.4)

## 2022-03-09 MED ORDER — METOPROLOL TARTRATE 5 MG/5ML IV SOLN
5.0000 mg | Freq: Once | INTRAVENOUS | Status: AC
  Administered 2022-03-09: 5 mg via INTRAVENOUS
  Filled 2022-03-09: qty 5

## 2022-03-09 MED ORDER — LACTATED RINGERS IV BOLUS
500.0000 mL | Freq: Once | INTRAVENOUS | Status: AC
  Administered 2022-03-09: 500 mL via INTRAVENOUS

## 2022-03-09 MED ORDER — FUROSEMIDE 10 MG/ML IJ SOLN
20.0000 mg | Freq: Once | INTRAMUSCULAR | Status: AC
  Administered 2022-03-09: 20 mg via INTRAVENOUS
  Filled 2022-03-09: qty 4

## 2022-03-09 MED ORDER — K PHOS MONO-SOD PHOS DI & MONO 155-852-130 MG PO TABS
500.0000 mg | ORAL_TABLET | Freq: Two times a day (BID) | ORAL | Status: AC
  Administered 2022-03-09: 500 mg via ORAL
  Filled 2022-03-09 (×3): qty 2

## 2022-03-09 MED ORDER — METOPROLOL TARTRATE 25 MG PO TABS
25.0000 mg | ORAL_TABLET | Freq: Two times a day (BID) | ORAL | Status: DC
  Administered 2022-03-09 – 2022-03-10 (×3): 25 mg via ORAL
  Filled 2022-03-09 (×3): qty 1

## 2022-03-09 MED ORDER — BISOPROLOL FUMARATE 5 MG PO TABS: 10.0000 mg | ORAL_TABLET | Freq: Two times a day (BID) | ORAL | Status: DC

## 2022-03-09 MED ORDER — MAGNESIUM SULFATE 4 GM/100ML IV SOLN
4.0000 g | Freq: Once | INTRAVENOUS | Status: AC
  Administered 2022-03-09: 4 g via INTRAVENOUS
  Filled 2022-03-09: qty 100

## 2022-03-09 NOTE — ED Notes (Signed)
Pt OOB to bedside recliner per request. Pt denies any other needs at this time. Cal bell in reach.

## 2022-03-09 NOTE — Progress Notes (Signed)
       CROSS COVER NOTE  NAME: Tammy [name] MRN: [id number] DOB : 09-22-1945 ATTENDING PHYSICIAN: Kerney Elbe, DO    Date of Service   03/09/2022   HPI/Events of Note   Notified of tachycardia. Home metoprolol held secondary to hypotension on arrival.   BP 129/89 HR 140s  Interventions   Assessment/Plan: EKG LR 567mL/hr Dimer with AM labs 5 mg IV Metoprolol        To reach the provider On-Call:   7AM- 7PM see care teams to locate the attending and reach out to them via [email or web address]. 7PM-7AM contact night-coverage If you still have difficulty reaching the appropriate provider, please page the [facility] (Director on Call) for [facility] on amion for assistance  This document was prepared using Set designer software and may include unintentional dictation errors.  [name] DNP, MBA, FNP-BC Nurse Practitioner [facility] Pager [phone]

## 2022-03-09 NOTE — Consult Note (Signed)
Cardiology Consultation   Patient ID: Tammy [name] MRN: [id number]; DOB: 1946/01/28  Admit date: 03/07/2022 Date of Consult: 03/09/2022  PCP:  [name], DO   [place] Providers Cardiologist:  Ida Rogue, MD   {  Patient Profile:   Tammy [name] is a 76 y.o. 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  History of Present Illness:   Tammy [name] underwent diagnostic cardiac cath in 2012 and again in September 2013, which revealed total occlusion of the mRCA with left-to-right collaterals.  The setting of dyspnea and chest pain, she underwent repeat catheterization in January 2021 which showed stable anatomy, and she was medically managed.  Echo in June 2021 showed an EF of 55 to 60% with grade 1 diastolic dysfunction, most recent ABIs in July 2022 were stable.  Patient was admitted for chest pain and March 2023.  Troponins were negative.  EKG without ischemic changes.  She was discharged home and seen in the office, where Myoview Carlton Adam was ordered.  The study was overall low risk with no ischemia.  In July 2023 patient underwent R/L cardiac cath for chest pain.  This showed 45% stenosis of the mid LAD lesion, proximal RCA to mid RCA 100% stenosis, proximal RCA lesion 90% stenosis.  Overall single one-vessel CAD with chronically occluded RCA with well-developed left-to-right collaterals, stable moderate mid LAD disease.  Right heart cath showed mildly elevated filling pressures, mild pulmonary hypertension and normal cardiac output.  Recommendations for medical therapy.  Patient presented to the ER 03/07/2022 with shortness of breath and weakness.  Also reported cough, congestion, chest pain, right lower quadrant abdominal pain,  nausea and diarrhea.  Was lethargic.  Patient had a cholecystectomy and appendectomy previously.  Patient tested positive for the flu in the ER 02/24/2022.  In the ER blood pressure 126/92, pulse 119 bpm, respiratory rate 24, afebrile, 93% O2.  BP as low as 70/49.  She was positive for the flu.  Labs showed potassium 5.3, serum creatinine 1.85, BUN 35, WBC 12.1, hemoglobin 15.7.  Chest x-ray showed prominent bilateral interstitial opacities, possible atypical infection.  CT of the head was nonacute.  CT of the chest abdomen pelvis was nonacute.  Initial EKG showed sinus tachycardia with a heart rate of 105 bpm, PACs, nonspecific T wave changes.  Patient was given IV fluids and started on antibiotics, and admitted for further workup.   Past Medical History:  Diagnosis Date   Asthma    Benign neoplasm of colon    Breast cancer ([place]) 08/2018   left breast   Cervical disc disease    "bulging" - no limitations per pt   Chronic diastolic CHF (congestive heart failure) ([place])    a. 07/2012 Echo: Nl EF. mild inferoseptal HK, Gr 2 DD, mild conc LVH, mild PR/TR, mild PAH; b. 08/2019 Echo: EF 55-60%, no rwma, Gr1 DD. Nl RV size/fxn.   CKD (chronic kidney disease), stage III (HCC)    COPD (chronic obstructive pulmonary disease) (HCC)    Coronary artery disease    a. 11/2011 Cath: LAD 63m/d, LCX min irregs, RCA 70p, 156m with L->R collats, EF 60%-->Med Rx; b. 03/2019 Cath: LM nl, LAD 40-62m. LCX large, mild diff dzs. OM1/2 nl. RCA known to be  148m. L-->R collats from LAD & LCX/OM.   Diabetes mellitus without complication (HCC)    Fusion of lumbar spine 12/22/2015   GERD (gastroesophageal reflux disease)    History of DVT (deep vein thrombosis)    History of tobacco abuse    a. Quit 2011.   Hyperlipidemia    Hypertension    Hypothyroidism    Lung cancer ([place])    Obesity    Palpitations    Personal history of radiation therapy    PONV (postoperative nausea and vomiting)    Pulmonary embolus ([place])  12/25/2013   RLL. Presented with SOB and elevated d-dimer.    PVD (peripheral vascular disease) ([place])    a. PTA of right leg and left femoral artery with stenosis; b. 09/2020 ABI: R 0.93, L 0.88 - unchanged from prior study.   Stroke [facility]) 2015   TIAs - no deficits    Past Surgical History:  Procedure Laterality Date   ANGIOPLASTY / STENTING FEMORAL     PVD; angioplasty right leg and left femoral artery with stenosis.    APPENDECTOMY  12/2014   BACK SURGERY  03/2012   nerve stimulator inserted   BACK SURGERY     screws, rods replaced with new hardware.   BACK SURGERY  11/2016   BREAST LUMPECTOMY Left 08/26/2018   BREAST SURGERY     CARDIAC CATHETERIZATION  12/07/2011   Mid LAD 50%, distal LAD 50%, mid RCA 70%, distal RCA 100%    CARDIAC CATHETERIZATION  01/04/2011   100% occluded mid RCA with good collaterals from distal LAD, normal LVEF.   CATARACT EXTRACTION Right 12/21/2021   CHOLECYSTECTOMY     COLONOSCOPY  2013   COLONOSCOPY WITH PROPOFOL N/A 05/24/2015   Procedure: COLONOSCOPY WITH PROPOFOL;  Surgeon: [name], MD;  Location: [place];  Service: Endoscopy;  Laterality: N/A;  Diabetic - oral meds   COLONOSCOPY WITH PROPOFOL N/A 01/28/2019   Procedure: COLONOSCOPY WITH PROPOFOL;  Surgeon: [name], MD;  Location: [facility];  Service: Endoscopy;  Laterality: N/A;   DIAGNOSTIC LAPAROSCOPY     ESOPHAGOGASTRODUODENOSCOPY (EGD) WITH PROPOFOL N/A 01/28/2019   Procedure: ESOPHAGOGASTRODUODENOSCOPY (EGD) WITH PROPOFOL;  Surgeon: [name], MD;  Location: [facility] END[facility];  Service: Endoscopy;  Laterality: N/A;   LAPAROSCOPIC APPENDECTOMY N/A 01/06/2015   Procedure: APPENDECTOMY LAPAROSCOPIC;  Surgeon: [name], MD;  Location: [facility] OR[facility];  Service: General;  Laterality: N/A;   LEFT HEART CATH AND CORONARY ANGIOGRAPHY Left 04/04/2019   Procedure: LEFT HEART CATH AND CORONARY ANGIOGRAPHY;  Surgeon: [name], MD;  Location: [place] CV [facility];  Service: Cardiovascular;  Laterality: Left;   MASTECTOMY W/ SENTINEL NODE BIOPSY Left 08/26/2018   Procedure: PARTIAL MASTECTOMY WIDE EXCISION WITH SENTINEL LYMPH NODE BIOPSY LEFT;  Surgeon: [name], MD;  Location: [facility];  Service: General;  Laterality: Left;   POLYPECTOMY  05/24/2015   Procedure: POLYPECTOMY;  Surgeon: [name], MD;  Location: [facility];  Service: Endoscopy;;   REPLACEMENT TOTAL KNEE Right    RIGHT/LEFT HEART CATH AND CORONARY ANGIOGRAPHY Bilateral 09/26/2021   Procedure: RIGHT/LEFT HEART CATH AND CORONARY ANGIOGRAPHY;  Surgeon: Wellington Hampshire, MD;  Location: [place] CV [facility];  Service: Cardiovascular;  Laterality: Bilateral;   THYROIDECTOMY     TOTAL KNEE ARTHROPLASTY Right 11/13/2019   TRIGGER FINGER RELEASE       Home Medications:  Prior to Admission medications   Medication Sig Start Date End Date Taking? Authorizing Provider  anastrozole (ARIMIDEX) 1 MG tablet TAKE 1 TABLET BY MOUTH DAILY 12/14/21  Yes Cammie Sickle, MD  Calcium Carb-Cholecalciferol (CALCIUM 600 + D) 600-200 MG-UNIT TABS Take 1 tablet by mouth daily.    Yes [provider]  cholecalciferol (VITAMIN D3) 25 MCG (1000 UT) tablet Take 1,000 Units by mouth daily.   Yes [provider]  DULoxetine (CYMBALTA) 60 MG capsule TAKE 2 CAPSULES BY MOUTH EVERY DAY 09/09/21  Yes Johnson, Megan P, DO  ELIQUIS 2.5 MG TABS tablet TAKE ONE TABLET BY MOUTH TWICE DAILY 10/28/21  Yes Johnson, Megan P, DO  gabapentin (NEURONTIN) 400 MG capsule Take 400 mg by mouth 3 (three) times daily. 01/30/22  Yes [provider]  glipiZIDE (GLUCOTROL) 5 MG tablet TAKE 1 TABLET BY MOUTH DAILY BEFORE BREAKFAST 12/06/21  Yes Johnson, Megan P, DO  insulin degludec (TRESIBA FLEXTOUCH) 100 UNIT/ML FlexTouch Pen Inject 40 Units into the skin daily. 11/02/21  Yes Johnson, Megan P, DO  isosorbide mononitrate (IMDUR) 60 MG 24 hr tablet TAKE ONE TABLET BY MOUTH AT  BEDTIME 10/28/21  Yes Johnson, Megan P, DO  JARDIANCE 25 MG TABS tablet Take 1 tablet (25 mg total) by mouth every morning. 12/06/21  Yes Johnson, Megan P, DO  Lactobacillus (PROBIOTIC ACIDOPHILUS PO) Take 1 capsule by mouth daily at 12 noon.   Yes [provider]  montelukast (SINGULAIR) 10 MG tablet Take 1 tablet (10 mg total) by mouth at bedtime. 12/06/21  Yes Johnson, Megan P, DO  MYRBETRIQ 50 MG TB24 tablet TAKE 1 TABLET BY MOUTH DAILY 01/31/21  Yes Vaillancourt, Samantha, PA-C  nitrofurantoin, macrocrystal-monohydrate, (MACROBID) 100 MG capsule Take 100 mg by mouth daily. 10/10/21  Yes [provider]  pantoprazole (PROTONIX) 40 MG tablet TAKE 1 TABLET BY MOUTH DAILY 12/06/21  Yes Johnson, Megan P, DO  rosuvastatin (CRESTOR) 40 MG tablet Take 1 tablet (40 mg total) by mouth daily. 12/06/21  Yes Johnson, Megan P, DO  SYNTHROID 150 MCG tablet TAKE ONE TABLET ON AN EMPTY STOMACH WITHA GLASS OF WATER AT LEAST 30 TO 60 MINUTES BEFORE BREAKFAST 06/06/21  Yes Johnson, Megan P, DO  acyclovir (ZOVIRAX) 800 MG tablet Take 800 mg by mouth 4 (four) times daily. 10/21/21   [provider]  albuterol (PROVENTIL) (2.5 MG/3ML) 0.083% nebulizer solution Take 3 mLs (2.5 mg total) by nebulization every 6 (six) hours as needed for wheezing or shortness of breath. 07/14/21   Chesley Mires, MD  albuterol (VENTOLIN HFA) 108 (90 Base) MCG/ACT inhaler Inhale 2 puffs into the lungs every 6 (six) hours as needed for wheezing or shortness of breath. 01/13/21   Johnson, Megan P, DO  benzonatate (TESSALON) 200 MG capsule Take 1 capsule (200 mg total) by mouth 2 (two) times daily as needed for cough. 02/16/22   Johnson, Megan P, DO  CRANBERRY PO Take 2 capsules by mouth daily.    [provider]  cyclobenzaprine (FLEXERIL) 5 MG tablet Take 1 tablet (5 mg total) by mouth 3 (three) times daily as needed for muscle spasms. 08/23/21   Vigg, Avanti, MD  Dulaglutide (TRULICITY) 4.5 QJ/1.9ER SOPN INJECT 4.5 MG  INTO THE SKIN ONCE A WEEK 02/17/22   Johnson, Megan P, DO  fluticasone (FLONASE) 50 MCG/ACT nasal spray Place 1 spray into both nostrils daily. 07/14/21   Chesley Mires, MD  gabapentin (NEURONTIN) 300 MG capsule Take 2 capsules (600 mg total) by mouth 3 (three) times daily. Patient not taking: Reported on 02/24/2022 11/08/21   Park Liter  P, DO  lidocaine (LIDODERM) 5 % Place 1 patch onto the skin daily. Remove & Discard patch within 12 hours or as directed by MD Patient not taking: Reported on 02/24/2022 09/08/21   Park Liter P, DO  moxifloxacin (VIGAMOX) 0.5 % ophthalmic solution Apply to eye. Patient not taking: Reported on 02/24/2022 12/22/21   [provider]  nitroGLYCERIN (NITROSTAT) 0.4 MG SL tablet Place 1 tablet (0.4 mg total) under the tongue every 5 (five) minutes as needed for chest pain. Total of 3 doses 09/20/21   [name], MD  oseltamivir (TAMIFLU) 30 MG capsule Take 1 capsule (30 mg total) by mouth 2 (two) times daily. Patient not taking: Reported on 03/08/2022 02/24/22   Nena Polio, MD  oxyCODONE-acetaminophen (PERCOCET/ROXICET) 5-325 MG tablet TAKE 1/2 TO 1 TABLET BY MOUTH EVERY 8 HOURS AS NEEDED FOR SEVERE PAIN 12/22/21   Park Liter P, DO  prednisoLONE acetate (PRED FORTE) 1 % ophthalmic suspension SMARTSIG:In Eye(s) Patient not taking: Reported on 02/24/2022 12/22/21   [provider]  predniSONE (DELTASONE) 10 MG tablet 6 tabs in the AM day 1, 5 tabs day 2, decrease by 1 daily until gone 02/16/22   Johnson, Megan P, DO  Pumpkin Seed-Soy Germ (AZO BLADDER CONTROL/GO-LESS PO) Take 1 tablet by mouth in the morning and at bedtime.    [provider]  senna (SENOKOT) 8.6 MG TABS tablet Take 8.6 mg by mouth daily as needed for mild constipation.    [provider]  trimethoprim (TRIMPEX) 100 MG tablet TAKE ONE TABLET BY MOUTH EVERY DAY 10/14/21   Jon Billings, NP  UNABLE TO FIND Med Name: Energy, memory and mood enhancer     [provider]  ZINC CITRATE PO Take 1 tablet by mouth daily.    [provider]    Inpatient Medications: Scheduled Meds:  anastrozole  1 mg Oral Daily   apixaban  2.5 mg Oral BID   DULoxetine  120 mg Oral Daily   guaiFENesin  1,200 mg Oral BID   insulin aspart  0-15 Units Subcutaneous Q4H   insulin glargine-yfgn  15 Units Subcutaneous Daily   ipratropium  0.5 mg Nebulization Q6H   isosorbide mononitrate  60 mg Oral QHS   levalbuterol  0.63 mg Nebulization Q6H   levothyroxine  150 mcg Oral Q0600   mirabegron ER  50 mg Oral Daily   pantoprazole  40 mg Oral Daily   phosphorus  500 mg Oral BID   rosuvastatin  40 mg Oral Daily   Continuous Infusions:  azithromycin Stopped (03/09/22 0402)   cefTRIAXone (ROCEPHIN)  IV Stopped (03/09/22 0115)   magnesium sulfate bolus IVPB 4 g (03/09/22 1420)   PRN Meds: acetaminophen **OR** acetaminophen, albuterol, benzonatate, nitroGLYCERIN, ondansetron **OR** ondansetron (ZOFRAN) IV, oxyCODONE-acetaminophen  Allergies:    Allergies  Allergen Reactions   Hydrocodone Other (See Comments)    Unresponsive   Aleve [Naproxen Sodium]     Hives & itching    Contrast Media [Iodinated Contrast Media]     Pt avoids due to kidney issues   Ioxaglate Other (See Comments)    (iodine) Pt avoids due to kidney issues    Oxycodone Other (See Comments)    hallucinations   Ranexa [Ranolazine]     Sick on stomach   Sulfa Antibiotics Other (See Comments)    Pt avoids due to kidney issues   Tramadol Other (See Comments)    nightmares Other reaction(s): Hallucination, Other (See Comments) nightmares  Social History:   Social History   Socioeconomic History   Marital status: Married    Spouse name: Not on file   Number of children: 2   Years of education: some college, Audiological scientist for interior design    Highest education level: Associate degree: occupational, Hotel manager, or vocational program  Occupational History    Occupation: Retired  Tobacco Use   Smoking status: Former    Packs/day: 0.50    Years: 15.00    Total pack years: 7.50    Types: Cigarettes    Quit date: 03/14/2007    Years since quitting: 14.9   Smokeless tobacco: Never  Vaping Use   Vaping Use: Never used  Substance and Sexual Activity   Alcohol use: No    Alcohol/week: 0.0 standard drinks of alcohol   Drug use: Never   Sexual activity: Not Currently    Birth control/protection: Post-menopausal  Other Topics Concern   Not on file  Social History Narrative   Lives at home with husband in [place]. Quit smoking 10 years ago; never alcohol. 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    Social Determinants of Health   Financial Resource Strain: Low Risk  (12/26/2021)   Overall Financial Resource Strain (CARDIA)    Difficulty of Paying Living Expenses: Not hard at all  Food Insecurity: No Food Insecurity (02/28/2022)   Hunger Vital Sign    Worried About Running Out of Food in the Last Year: Never true    Ran Out of Food in the Last Year: Never true  Transportation Needs: No Transportation Needs (02/28/2022)   PRAPARE - Hydrologist (Medical): No    Lack of Transportation (Non-Medical): No  Physical Activity: Inactive (12/26/2021)   Exercise Vital Sign    Days of Exercise per Week: 0 days    Minutes of Exercise per Session: 0 min  Stress: No Stress Concern Present (12/26/2021)   [place]    Feeling of Stress : Not at all  Social Connections: [place] (12/28/2020)   Social Connection and Isolation Panel [NHANES]    Frequency of Communication with Friends and Family: More than three times a week    Frequency of Social Gatherings with Friends and Family: More than three times a week    Attends Religious Services: More than 4 times per year    Active Member of [employer] or Organizations:  Yes    Attends Archivist Meetings: 1 to 4 times per year    Marital Status: Married  Human resources officer Violence: Not At Risk (12/26/2021)   Humiliation, Afraid, Rape, and Kick questionnaire    Fear of Current or Ex-Partner: No    Emotionally Abused: No    Physically Abused: No    Sexually Abused: No    Family History:    Family History  Problem Relation Age of Onset   Cancer Mother        colon   Hypertension Father    Heart disease Father    Heart attack Father    Diabetes Father    Cancer Sister        lung   COPD Sister    COPD Sister    Heart attack Brother    Diabetes Brother    Hypertension Brother    Heart disease Brother    Heart attack Brother    Diabetes Brother  Hypertension Brother    Heart disease Brother    Heart attack Brother    Diabetes Brother    Hypertension Brother    Heart disease Brother    Colon cancer Brother 64   Lung cancer Brother    Breast cancer Neg Hx      ROS:  Please see the history of present illness.   All other ROS reviewed and negative.     Physical Exam/Data:   Vitals:   03/09/22 1200 03/09/22 1300 03/09/22 1312 03/09/22 1400  BP: 133/75 (!) 139/94  121/71  Pulse: (!) 106 (!) 119  (!) 118  Resp: 20 20  13   Temp:   98.2 F (36.8 C)   TempSrc:      SpO2:  98%    Weight:      Height:        Intake/Output Summary (Last 24 hours) at 03/09/2022 1441 Last data filed at 03/09/2022 0600 Gross per 24 hour  Intake 849.59 ml  Output 700 ml  Net 149.59 ml      03/07/2022    7:07 PM 02/24/2022    7:10 PM 02/16/2022    2:09 PM  Last 3 Weights  Weight (lbs) 207 lb 3.7 oz 208 lb 15.9 oz 209 lb  Weight (kg) 94 kg 94.8 kg 94.802 kg     Body mass index is 33.45 kg/m.  General:  Well nourished, well developed, in no acute distress HEENT: normal Neck: no JVD Vascular: No carotid bruits; Distal pulses 2+ bilaterally Cardiac:  normal S1, S2; tachycardic, RR; no murmur  Lungs:  clear to auscultation  bilaterally, no wheezing, rhonchi or rales  Abd: soft, nontender, no hepatomegaly  Ext: no edema Musculoskeletal:  No deformities, BUE and BLE strength normal and equal Skin: warm and dry  Neuro:  CNs 2-12 intact, no focal abnormalities noted Psych:  Normal affect   EKG:  The EKG was personally reviewed and demonstrates:  ST, 105bpm, LAD, TWI aVL Telemetry:  Telemetry was personally reviewed and demonstrates:  ST, HR 90-130, brief runs of SVT   Relevant CV Studies:  Cardiac Cath 09/2021   Mid LAD lesion is 45% stenosed.   Prox RCA to Mid RCA lesion is 100% stenosed.   Prox RCA lesion is 90% stenosed.   1.  Severe one-vessel coronary artery disease with chronically occluded right coronary artery with well-developed left-to-right collaterals.  Stable moderate mid LAD disease. 2.  Left ventricular angiography was not performed due to chronic kidney disease.  EF was normal by echo. 3.  Right heart catheterization showed mildly elevated filling pressures, mild pulmonary hypertension and normal cardiac output. RA: 8 mmHg RV: 47/5 mmHg PA: 48/18 with a mean of 31 mmHg Cardiac output: 6.38 with an index of 3.08.   Recommendations: No significant change in coronary anatomy since most recent cardiac catheterization. Continue aggressive medical therapy. 26 mL of contrast was used for the procedure.  Coronary Diagrams  Diagnostic Dominance: Right    Myoview Lexiscan 06/2021    The study is low risk.   There is no evidence for ischemia.   Left ventricular function is normal. LVEF 80%.   LAD calcifications noted.  Echo 08/2019 1. Left ventricular ejection fraction, by estimation, is 55 to 60%. The  left ventricle has normal function. The left ventricle has no regional  wall motion abnormalities. Left ventricular diastolic parameters are  consistent with Grade I diastolic  dysfunction (impaired relaxation).   2. Right ventricular systolic function is low normal.  The right   ventricular size is normal.   3. The mitral valve was not well visualized. No evidence of mitral valve  regurgitation. No evidence of mitral stenosis.   4. The aortic valve was not well visualized. Aortic valve regurgitation  is not visualized. No aortic stenosis is present.   5. The inferior vena cava is normal in size with greater than 50%  respiratory variability, suggesting right atrial pressure of 3 mmHg.    Laboratory Data:  High Sensitivity Troponin:   Recent Labs  Lab 02/24/22 1912 03/07/22 1917 03/07/22 2335  TROPONINIHS 11 11 12      Chemistry Recent Labs  Lab 03/06/22 2335 03/07/22 1917 03/08/22 0424 03/08/22 1045 03/09/22 1039  NA  --  140 146*  --  141  K  --  5.3* 4.5  --  4.3  CL  --  109 114*  --  112*  CO2  --  22 22  --  22  GLUCOSE  --  169* 86  --  149*  BUN  --  35* 40*  --  21  CREATININE  --  1.84* 1.84*  --  1.24*  CALCIUM  --  8.6* 8.3*  --  7.4*  MG 2.1  --   --  2.0 1.5*  GFRNONAA  --  28* 28*  --  45*  ANIONGAP  --  9 10  --  7    Recent Labs  Lab 03/08/22 1045 03/09/22 1039  PROT 5.7* 5.7*  ALBUMIN 2.3* 2.3*  AST 22 27  ALT 15 17  ALKPHOS 61 67  BILITOT 0.4 0.4   Lipids No results for input(s): "CHOL", "TRIG", "HDL", "LABVLDL", "LDLCALC", "CHOLHDL" in the last 168 hours.  Hematology Recent Labs  Lab 03/07/22 1917 03/08/22 0424 03/09/22 1039  WBC 12.1* 10.6* 8.0  RBC 5.62* 4.93 4.43  HGB 15.7* 13.9 12.4  HCT 51.5* 45.5 40.6  MCV 91.6 92.3 91.6  MCH 27.9 28.2 28.0  MCHC 30.5 30.5 30.5  RDW 14.9 15.0 15.4  PLT 301 271 222   Thyroid No results for input(s): "TSH", "FREET4" in the last 168 hours.  BNP Recent Labs  Lab 03/07/22 1917  BNP 58.6    DDimer  Recent Labs  Lab 03/09/22 1039  DDIMER 2.08*     Radiology/Studies:  DG Chest Port 1 View  Result Date: 03/09/2022 CLINICAL DATA:  Shortness of breath. EXAM: PORTABLE CHEST 1 VIEW COMPARISON:  Chest radiograph dated 03/08/2022. FINDINGS: Left lung base  atelectasis. No focal consolidation, pleural effusion or pneumothorax. Top-normal cardiac size. Atherosclerotic calcification of the aortic arch. No acute osseous pathology. IMPRESSION: No acute cardiopulmonary process. Electronically Signed   By: Anner Crete M.D.   On: 03/09/2022 01:35   DG Chest Portable 1 View  Result Date: 03/08/2022 CLINICAL DATA:  76 year old female with history of shortness of breath. EXAM: PORTABLE CHEST 1 VIEW COMPARISON:  Chest x-ray 03/07/2022. FINDINGS: Linear area of scarring in the left mid to upper lung, unchanged. No acute consolidative airspace disease. No pleural effusions. No pneumothorax. No evidence of pulmonary edema. Heart size is mildly enlarged. The patient is rotated to the right on today's exam, resulting in distortion of the mediastinal contours and reduced diagnostic sensitivity and specificity for mediastinal pathology. Atherosclerotic calcifications are noted in the thoracic aorta. Spinal cord stimulator projecting over the lower thoracic spine incidentally noted. IMPRESSION: 1. No radiographic evidence of acute cardiopulmonary disease. 2. Mild cardiomegaly. 3. Aortic atherosclerosis. Electronically Signed   By: Quillian Quince  Entrikin M.D.   On: 03/08/2022 06:02   CT CHEST ABDOMEN PELVIS WO CONTRAST  Result Date: 03/08/2022 CLINICAL DATA:  Sepsis. EXAM: CT CHEST, ABDOMEN AND PELVIS WITHOUT CONTRAST TECHNIQUE: Multidetector CT imaging of the chest, abdomen and pelvis was performed following the standard protocol without IV contrast. RADIATION DOSE REDUCTION: This exam was performed according to the departmental dose-optimization program which includes automated exposure control, adjustment of the mA and/or kV according to patient size and/or use of iterative reconstruction technique. COMPARISON:  CT abdomen pelvis dated 01/11/2022. FINDINGS: Evaluation of this exam is limited in the absence of intravenous contrast. CT CHEST FINDINGS Cardiovascular: There is no  cardiomegaly or pericardial effusion. Three-vessel coronary vascular calcification. Advanced atherosclerotic calcification of the thoracic aorta. Mildly dilated distal descending thoracic aorta measure up to 3.8 cm in caliber. The central pulmonary arteries are grossly unremarkable on this noncontrast CT. Mediastinum/Nodes: No hilar or mediastinal adenopathy. The esophagus is grossly unremarkable. No mediastinal fluid collection. Lungs/Pleura: Background of centrilobular emphysema. There are bibasilar subpleural atelectasis/scarring. Linear scarring in the left upper lobe. No consolidative changes. There is no pleural effusion or pneumothorax. The central airways are patent. Musculoskeletal: Subacute or old fractures of the left third and fourth ribs without healing at this time. No acute osseous pathology. Lower thoracic spinal stimulator. CT ABDOMEN PELVIS FINDINGS No intra-abdominal free air or free fluid. Hepatobiliary: The liver is unremarkable. No biliary dilatation. Cholecystectomy. No retained calcified stone noted in the central CBD. Pancreas: Unremarkable. No pancreatic ductal dilatation or surrounding inflammatory changes. Spleen: Normal in size without focal abnormality. Adrenals/Urinary Tract: The adrenal glands are unremarkable. Moderate right renal parenchyma atrophy. Chronic mild right hydronephrosis with transition at the ureteropelvic junction may represent a stricture. No stone identified. There is no hydronephrosis or nephrolithiasis on the left. The visualized ureters and urinary bladder appear unremarkable. Stomach/Bowel: There is sigmoid diverticulosis without active inflammatory changes. There is a 3 cm duodenal diverticulum. There is no bowel obstruction. Appendectomy. Vascular/Lymphatic: Advanced aortoiliac atherosclerotic disease. The IVC is unremarkable. No portal venous gas. There is no adenopathy. Reproductive: The uterus is grossly unremarkable. No adnexal masses. Other: None  Musculoskeletal: Osteopenia with degenerative changes of the spine. Multilevel disc spacer and posterior fusion. No acute osseous pathology. IMPRESSION: 1. No acute intrathoracic, abdominal, or pelvic pathology. 2. Chronic mild right hydronephrosis with transition at the ureteropelvic junction may represent a stricture. No stone identified. 3. Sigmoid diverticulosis as well as duodenal diverticulum. No bowel obstruction. 4. Mildly dilated distal descending thoracic aorta measure up to 3.8 cm in caliber. 5. Aortic Atherosclerosis (ICD10-I70.0) and Emphysema (ICD10-J43.9). Electronically Signed   By: Anner Crete M.D.   On: 03/08/2022 01:05   CT HEAD WO CONTRAST (5MM)  Result Date: 03/08/2022 CLINICAL DATA:  Mental status change, unknown cause EXAM: CT HEAD WITHOUT CONTRAST TECHNIQUE: Contiguous axial images were obtained from the base of the skull through the vertex without intravenous contrast. RADIATION DOSE REDUCTION: This exam was performed according to the departmental dose-optimization program which includes automated exposure control, adjustment of the mA and/or kV according to patient size and/or use of iterative reconstruction technique. COMPARISON:  11/22/2015 FINDINGS: Brain: Normal anatomic configuration. Parenchymal volume loss is commensurate with the patient's age. Mild periventricular white matter changes are present likely reflecting the sequela of small vessel ischemia. Left frontal cortical encephalomalacia is unchanged no abnormal intra or extra-axial mass lesion or fluid collection. No abnormal mass effect or midline shift. No evidence of acute intracranial hemorrhage or infarct. Ventricular size is normal.  Cerebellum unremarkable. Vascular: No asymmetric hyperdense vasculature at the skull base. Skull: Intact Sinuses/Orbits: Paranasal sinuses are clear. Orbits are unremarkable. Other: Mastoid air cells and middle ear cavities are clear. IMPRESSION: 1. No acute intracranial hemorrhage or  infarct. 2. Stable left frontal cortical encephalomalacia. 3. Stable senescent change. Electronically Signed   By: Fidela Salisbury M.D.   On: 03/08/2022 00:57   DG Chest Port 1 View  Result Date: 03/07/2022 CLINICAL DATA:  Shortness of breath EXAM: PORTABLE CHEST 1 VIEW COMPARISON:  CXR 02/24/22 FINDINGS: No pleural effusion. No pneumothorax. Unchanged cardiac and mediastinal contours. Tortuous thoracic aorta. Prominent bilateral interstitial opacities in the more focal opacity in the peripheral left upper lung. Displaced rib fractures. Visualized upper abdomen is unremarkable. Spinal nerve stimulator in place. Degenerative changes of the [facility] and glenohumeral joints. IMPRESSION: Prominent bilateral interstitial opacities with a more focal opacity in the peripheral left upper lung. Findings are nonspecific, could represent findings related to atypical infection/small airways disease. Electronically Signed   By: Marin Roberts M.D.   On: 03/07/2022 19:49     Assessment and Plan:   Narrow complex Tachycardia - EKG showed Sinus tachycardia in the setting of multiple pulmonary acute issues - tele with possible brief runs of SVT - s/p IV metoprolol 5mg  - PTA ?bisoprolol - start metoprolol 25mg  BID - continue telemetry  Acute respiratory failure Influenza A with  Septic Shock 2/2 influenza wit PNA AMS - pt was tachycardic, tachypnea, with leukocytosis on admission  - with AMS. CT head nonacute - briefly on Levophed - IV abx per IM - given IV lasix 20mg  x1 - per IM  H/o PE - continue chronic Eliquis  CAD - most recent cath in August 2023 (report above). No changes since prior cath, recommended medical management - No chest pain reported - continue Crestor - may need to hold Imdur for ability to increase BB  Elevated Ddimer - suspected reactive given she is on Eliquis 2.5mg  BID  AKI on CKD stage 3 - baseline Scr 1.5-1.7 - Scr on arrival 1.84, BUN 40  Electrolyte disturbance - pt was  given Mag and Phos - K 5.3  For questions or updates, please contact [place] Please consult [email or web address] for contact info under    Signed, Omid Deardorff Ninfa Meeker, PA-C  03/09/2022 2:41 PM

## 2022-03-09 NOTE — ED Notes (Signed)
Pt rounded on and stated she was wet due to purewick leaking. Pt cleaned up, new linens, new purewick and chux placed. Pt thought she needed to have a BM, pt placed on bed pan and ten pt stated she did not need to go, pt removed from bed pan. Pt denies any other needs at this time. VSS.   Pt put on 2L/min via [place] from 3L/min.

## 2022-03-09 NOTE — Progress Notes (Addendum)
PROGRESS NOTE    Tammy [name]  MLY:[id number] DOB: July 04, 1945 DOA: 03/07/2022 PCP: [name], DO   Brief Narrative:  The patient is an obese Caucasian female the past medical history significant for but not due to breast cancer status postmastectomy on anastrozole, history of lung cancer, history of chronic kidney disease stage IIIb, hypertension, PAD, history of PE on anticoagulation with apixaban, history of frequent urinary tract infections, history of diabetes mellitus type 2 on insulin, CAD as well as other comorbidities who was recently diagnosed with influenza A on 02/24/2022 and presented to the ED with persistent shortness of breath associate with weakness and lethargy.  On room air she was saturating 85%.  In the ED she is found to be tachycardic, tachypneic and had to be placed on supplemental oxygen via nasal cannula and blood pressure was low and dropped and had to be placed on Levophed short-term.  WBC was elevated and creatinine was slightly higher than her baseline and EKG was done that showed nonspecific ST-T wave changes.  She underwent a CT scan of the abdomen chest abdomen pelvis which showed no acute intrathoracic, abdominal or pelvic pathology but she did also have some chronic mild right hydronephrosis with transition at the ureteropelvic junction that may just represent stricture as well as sigmoidal diverticulosis as well as a duodenal diverticulum.  She was noted to have aortic atherosclerosis as well as emphysema and a mildly dilated distal descending thoracic aorta measuring 3.8 cm in caliber.  She also had a head CT scan which was done to acute she was given IV fluids, ceftriaxone and the hospitalist with consult for admission of this patient to the hospital.     **Interim History  Patient is feeling better and improving slowly.  She states that she feels closer to baseline but wants to ambulate today.  PT OT have been consulted for further evaluation.  Still requiring  oxygen but weaning off of this and is on 1 L this morning.  Assessment and Plan: Acute respiratory failure with hypoxia (HCC) Influenza A with pneumonia Septic Shock 2/2 to Influenza A with PNA, improved  -Patient was diagnosed with influenza A on 12/15 so suspecting bacterial superinfection given persistent symptoms now with hypoxia -Sepsis criteria include tachycardia and tachypnea with leukocytosis, AMS and had to be placed on short term Levophed  -Sepsis fluids initiated  -Continue Rocephin and azithromycin -Supplemental oxygen to keep sats over 92% -Antitussives, incentive spirometry and supportive care -SpO2: 100 % O2 Flow Rate (L/min): 1 L/min -Continuous pulse Oximetry and Maintain O2 saturations >90% -Will try low-dose Lasix 20 mg x 1 -Continue supplemental oxygen via nasal cannula wean O2 as tolerated -Patient will need an Ambulatory home O2 screen prior to discharge as well as repeat chest x-ray in the a.m. -Repeat CXR this AM showed "Left lung base atelectasis. No focal consolidation, pleural effusion or pneumothorax. Top-normal cardiac size. Atherosclerotic calcification of the aortic arch. No acute osseous pathology."   Acute Metabolic Encephalopathy, improved  -Secondary to sepsis -Was kept n.p.o. now that she is much more awake and alert will place on her diet -ABG:    Component Value Date/Time   HCO3 24.7 03/08/2022 0630   ACIDBASEDEF 3.8 (H) 03/08/2022 0630   O2SAT 42.5 03/08/2022 0630  -C/w Aspiration and fall precautions -C/w Neurologic checks   Coronary atherosclerosis of native coronary artery -No complaints of chest pain and troponin negative and EKG nonacute -Continue Imdur, nitroglycerin as needed and rosuvastatin   Chronic Anticoagulation  History of PE -Continue Apixaban  Elevated D-Dimer -D-Dimer was 2.08 -Likely reactive as above -Already Anticoagulated with Apixaban    AKI on Stage 3b Chronic Kidney Disease ([place]) -Renal function at baseline  but slightly worsened and now improved Recent Labs  Lab 02/24/22 1912 03/07/22 1917 03/08/22 0424 03/09/22 1039  BUN 25* 35* 40* 21  CREATININE 1.58* 1.84* 1.84* 1.24*  -Baseline creatinine is around 1.5-1.7 -Avoid nephrotoxic medications, contrast dyes, hypotension and dehydration to ensure adequate renal perfusion and will need to renally adjust medications -Repeat CMP in a.m.   Left breast cancer s/p mastectomy -Continue Arimidex   Hypomagnesemia -Patients' Mag Level went from 2.0 -> 1.5 -Replete with IV Mag Sulfate 4 grams -Continue to Monitor and Trend -Repeat Mag Level in the AM  Hypophosphatemia -Patients Phos Level went from 3.9 -> 2.0 -Replete with po K Phos 500 mg po BID x2 -Continue to Monitor and Replete Phos Level in the AM   Diabetes mellitus, type II (HCC) -Low-dose basal insulin -Sliding scale coverage -CBGs ranging from 86-108   Hyperkalemia -Mild elevation of potassium to 5.3 Being hydrated expecting improvement and as expected improved to 4.5\  Tachycardia -D-Dimer Elevated -Already Anticoagulated -EKG showed Sinus Tachycardia and had an irregular rate -Given a bolus of 500 mL of LR -In the setting of Nebs -Continue to Monitor -Given IV Metoprolol 5 mg    COPD (chronic obstructive pulmonary disease) (HCC) -DuoNebs as needed -Will add Xopenex and Atrovent as well as guaifenesin, flutter valve and incentive spirometry   Fatty liver disease, nonalcoholic -No acute issues   Essential Hypertension -> Hypoenstion -Normotensive initially but then had attention to the point where she ended up on some low-dose Levophed but improved -Avoid further hypertensive medications -Continue monitor blood pressures per protocol and was given fluid bolus of 500 mL yesterday but BP is now improved   Chronic back pain -Continue Duloxetine, as needed oxycodone.   -Will hold Neurontin and Flexeril until more alert   Hypoalbuminemia -The patient albumin level is  now 2.3 x2 -Continue to monitor and trend and repeat CMP in a.m.  Obesity -Complicates overall prognosis and care -Estimated body mass index is 33.45 kg/m as calculated from the following:   Height as of this encounter: 5\' 6"  (1.676 m).   Weight as of this encounter: 94 kg.  -Weight Loss and Dietary Counseling given  DVT prophylaxis: apixaban (ELIQUIS) tablet 2.5 mg Start: 03/08/22 1000 apixaban (ELIQUIS) tablet 2.5 mg    Code Status: Full Code Family Communication: No family present at bedside   Disposition Plan:  Level of care: Progressive Status is: Inpatient Remains inpatient appropriate because: Continues to improve slowly and will need an ambulatory home O2 screen prior to discharge as well as evaluation by PT/OT   Consultants:  None  Procedures:  As delineated as above  Antimicrobials:  Anti-infectives (From admission, onward)    Start     Dose/Rate Route Frequency Ordered Stop   03/09/22 0000  cefTRIAXone (ROCEPHIN) 2 g in sodium chloride 0.9 % 100 mL IVPB        2 g 200 mL/hr over 30 Minutes Intravenous Every 24 hours 03/08/22 0156 03/12/22 2359   03/08/22 0200  azithromycin (ZITHROMAX) 500 mg in sodium chloride 0.9 % 250 mL IVPB        500 mg 250 mL/hr over 60 Minutes Intravenous Every 24 hours 03/08/22 0156 03/13/22 0159   03/08/22 0115  cefTRIAXone (ROCEPHIN) 2 g in sodium chloride 0.9 % 100 mL  IVPB        2 g 200 mL/hr over 30 Minutes Intravenous  Once 03/08/22 0105 03/08/22 0220   03/08/22 0115  azithromycin (ZITHROMAX) 500 mg in sodium chloride 0.9 % 250 mL IVPB  Status:  Discontinued        500 mg 250 mL/hr over 60 Minutes Intravenous  Once 03/08/22 0105 03/08/22 0159       Subjective: Seen and examined at bedside and she thinks she is doing better today.  Says that she did not sleep very well last night.  States that she is getting weaker laying in the bed and has not been up out of bed yet.  Thinks her hands are little bit puffy.  Heart rate was  elevated in the night but now improving.  Denies any lightheadedness or dizziness.  No other concerns or complaints this time.  Objective: Vitals:   03/09/22 0800 03/09/22 0900 03/09/22 1000 03/09/22 1050  BP: 107/65 122/62 129/67   Pulse: (!) 101 (!) 107 (!) 112 (!) 111  Resp: 19 (!) 21 (!) 25 (!) 21  Temp:      TempSrc:      SpO2:   98% 100%  Weight:      Height:        Intake/Output Summary (Last 24 hours) at 03/09/2022 1221 Last data filed at 03/09/2022 0600 Gross per 24 hour  Intake 2441.97 ml  Output 700 ml  Net 1741.97 ml   Filed Weights   03/07/22 1907  Weight: 94 kg   Examination: Physical Exam:  Constitutional: WN/WD obese Caucasian female currently no acute distress appears improved from yesterday Respiratory: Diminished to auscultation bilaterally on anterior lung fields with some slight rhonchi and slight crackles. No appreciable wheezing or rales.. Normal respiratory effort and patient is not tachypenic. No accessory muscle use.  Unlabored breathing Cardiovascular: RRR, no murmurs / rubs / gallops. S1 and S2 auscultated. No extremity edema. 2+ pedal pulses. No carotid bruits.  Abdomen: Soft, non-tender, distended. Bowel sounds positive.  GU: Deferred. Musculoskeletal: No clubbing / cyanosis of digits/nails. No joint deformity upper and lower extremities.  Skin: No rashes, lesions, ulcers on a limited skin evaluation. No induration; Warm and dry.  Neurologic: CN 2-12 grossly intact with no focal deficits. Romberg sign and cerebellar reflexes not assessed.  Psychiatric: Normal judgment and insight. Alert and oriented x 3. Normal mood and appropriate affect.   Data Reviewed: I have personally reviewed following labs and imaging studies  CBC: Recent Labs  Lab 03/07/22 1917 03/08/22 0424 03/09/22 1039  WBC 12.1* 10.6* 8.0  NEUTROABS  --   --  5.8  HGB 15.7* 13.9 12.4  HCT 51.5* 45.5 40.6  MCV 91.6 92.3 91.6  PLT 301 271 423   Basic Metabolic  Panel: Recent Labs  Lab 03/06/22 2335 03/07/22 1917 03/08/22 0424 03/08/22 1045 03/09/22 1039  NA  --  140 146*  --  141  K  --  5.3* 4.5  --  4.3  CL  --  109 114*  --  112*  CO2  --  22 22  --  22  GLUCOSE  --  169* 86  --  149*  BUN  --  35* 40*  --  21  CREATININE  --  1.84* 1.84*  --  1.24*  CALCIUM  --  8.6* 8.3*  --  7.4*  MG 2.1  --   --  2.0 1.5*  PHOS  --   --   --  3.9 2.0*   GFR: Estimated Creatinine Clearance: 43.2 mL/min (A) (by C-G formula based on SCr of 1.24 mg/dL (H)). Liver Function Tests: Recent Labs  Lab 03/08/22 1045 03/09/22 1039  AST 22 27  ALT 15 17  ALKPHOS 61 67  BILITOT 0.4 0.4  PROT 5.7* 5.7*  ALBUMIN 2.3* 2.3*   No results for input(s): "LIPASE", "AMYLASE" in the last 168 hours. No results for input(s): "AMMONIA" in the last 168 hours. Coagulation Profile: Recent Labs  Lab 03/08/22 0424  INR 1.1   Cardiac Enzymes: No results for input(s): "CKTOTAL", "CKMB", "CKMBINDEX", "TROPONINI" in the last 168 hours. BNP (last 3 results) No results for input(s): "PROBNP" in the last 8760 hours. HbA1C: No results for input(s): "HGBA1C" in the last 72 hours. CBG: Recent Labs  Lab 03/08/22 1618 03/08/22 2051 03/09/22 0035 03/09/22 0447 03/09/22 0742  GLUCAP 116* 90 101* 108* 86   Lipid Profile: No results for input(s): "CHOL", "HDL", "LDLCALC", "TRIG", "CHOLHDL", "LDLDIRECT" in the last 72 hours. Thyroid Function Tests: No results for input(s): "TSH", "T4TOTAL", "FREET4", "T3FREE", "THYROIDAB" in the last 72 hours. Anemia Panel: No results for input(s): "VITAMINB12", "FOLATE", "FERRITIN", "TIBC", "IRON", "RETICCTPCT" in the last 72 hours. Sepsis Labs: Recent Labs  Lab 03/07/22 2335 03/08/22 0543  PROCALCITON 0.47  --   LATICACIDVEN  --  0.9    Recent Results (from the past 240 hour(s))  Resp panel by RT-PCR (RSV, Flu A&B, Covid) Anterior Nasal Swab     Status: Abnormal   Collection Time: 03/08/22 12:00 AM   Specimen: Anterior  Nasal Swab  Result Value Ref Range Status   SARS Coronavirus 2 by RT PCR NEGATIVE NEGATIVE Final    Comment: (NOTE) SARS-CoV-2 target nucleic acids are NOT DETECTED.  The SARS-CoV-2 RNA is generally detectable in upper respiratory specimens during the acute phase of infection. The lowest concentration of SARS-CoV-2 viral copies this assay can detect is 138 copies/mL. A negative result does not preclude SARS-Cov-2 infection and should not be used as the sole basis for treatment or other patient management decisions. A negative result may occur with  improper specimen collection/handling, submission of specimen other than nasopharyngeal swab, presence of viral mutation(s) within the areas targeted by this assay, and inadequate number of viral copies(<138 copies/mL). A negative result must be combined with clinical observations, patient history, and epidemiological information. The expected result is Negative.  Fact Sheet for Patients:  EntrepreneurPulse.com.au  Fact Sheet for Healthcare Providers:  IncredibleEmployment.be  This test is no t yet approved or cleared by the [place] FDA and  has been authorized for detection and/or diagnosis of SARS-CoV-2 by FDA under an Emergency Use Authorization (EUA). This EUA will remain  in effect (meaning this test can be used) for the duration of the COVID-19 declaration under Section 564(b)(1) of the Act, 21 U.S.C.section 360bbb-3(b)(1), unless the authorization is terminated  or revoked sooner.       Influenza A by PCR POSITIVE (A) NEGATIVE Final   Influenza B by PCR NEGATIVE NEGATIVE Final    Comment: (NOTE) The Xpert Xpress SARS-CoV-2/FLU/RSV plus assay is intended as an aid in the diagnosis of influenza from Nasopharyngeal swab specimens and should not be used as a sole basis for treatment. Nasal washings and aspirates are unacceptable for Xpert Xpress SARS-CoV-2/FLU/RSV testing.  Fact Sheet  for Patients: EntrepreneurPulse.com.au  Fact Sheet for Healthcare Providers: IncredibleEmployment.be  This test is not yet approved or cleared by the [place] FDA and has been authorized for detection  and/or diagnosis of SARS-CoV-2 by FDA under an Emergency Use Authorization (EUA). This EUA will remain in effect (meaning this test can be used) for the duration of the COVID-19 declaration under Section 564(b)(1) of the Act, 21 U.S.C. section 360bbb-3(b)(1), unless the authorization is terminated or revoked.     Resp Syncytial Virus by PCR NEGATIVE NEGATIVE Final    Comment: (NOTE) Fact Sheet for Patients: EntrepreneurPulse.com.au  Fact Sheet for Healthcare Providers: IncredibleEmployment.be  This test is not yet approved or cleared by the [place] FDA and has been authorized for detection and/or diagnosis of SARS-CoV-2 by FDA under an Emergency Use Authorization (EUA). This EUA will remain in effect (meaning this test can be used) for the duration of the COVID-19 declaration under Section 564(b)(1) of the Act, 21 U.S.C. section 360bbb-3(b)(1), unless the authorization is terminated or revoked.  Performed at [facility], [address] [place], [place] [address]   Blood culture (routine x 2)     Status: None (Preliminary result)   Collection Time: 03/08/22  1:30 AM   Specimen: Right Antecubital; Blood  Result Value Ref Range Status   Specimen Description RIGHT ANTECUBITAL  Final   Special Requests   Final    BOTTLES DRAWN AEROBIC AND ANAEROBIC Blood Culture adequate volume   Culture   Final    NO GROWTH 1 DAY Performed at [facility], [address] [place],  [address]    Report Status PENDING  Incomplete  Blood culture (routine x 2)     Status: None (Preliminary result)   Collection Time: 03/08/22  1:35 AM   Specimen: BLOOD RIGHT HAND  Result Value Ref Range  Status   Specimen Description BLOOD RIGHT HAND  Final   Special Requests   Final    BOTTLES DRAWN AEROBIC AND ANAEROBIC Blood Culture results may not be optimal due to an inadequate volume of blood received in culture bottles   Culture   Final    NO GROWTH 1 DAY Performed at [facility], [address], [place],  [address]    Report Status PENDING  Incomplete    Radiology Studies: DG Chest Port 1 View  Result Date: 03/09/2022 CLINICAL DATA:  Shortness of breath. EXAM: PORTABLE CHEST 1 VIEW COMPARISON:  Chest radiograph dated 03/08/2022. FINDINGS: Left lung base atelectasis. No focal consolidation, pleural effusion or pneumothorax. Top-normal cardiac size. Atherosclerotic calcification of the aortic arch. No acute osseous pathology. IMPRESSION: No acute cardiopulmonary process. Electronically Signed   By: Anner Crete M.D.   On: 03/09/2022 01:35   DG Chest Portable 1 View  Result Date: 03/08/2022 CLINICAL DATA:  76 year old female with history of shortness of breath. EXAM: PORTABLE CHEST 1 VIEW COMPARISON:  Chest x-ray 03/07/2022. FINDINGS: Linear area of scarring in the left mid to upper lung, unchanged. No acute consolidative airspace disease. No pleural effusions. No pneumothorax. No evidence of pulmonary edema. Heart size is mildly enlarged. The patient is rotated to the right on today's exam, resulting in distortion of the mediastinal contours and reduced diagnostic sensitivity and specificity for mediastinal pathology. Atherosclerotic calcifications are noted in the thoracic aorta. Spinal cord stimulator projecting over the lower thoracic spine incidentally noted. IMPRESSION: 1. No radiographic evidence of acute cardiopulmonary disease. 2. Mild cardiomegaly. 3. Aortic atherosclerosis. Electronically Signed   By: Vinnie Langton M.D.   On: 03/08/2022 06:02   CT CHEST ABDOMEN PELVIS WO CONTRAST  Result Date: 03/08/2022 CLINICAL DATA:  Sepsis. EXAM: CT CHEST, ABDOMEN  AND PELVIS  WITHOUT CONTRAST TECHNIQUE: Multidetector CT imaging of the chest, abdomen and pelvis was performed following the standard protocol without IV contrast. RADIATION DOSE REDUCTION: This exam was performed according to the departmental dose-optimization program which includes automated exposure control, adjustment of the mA and/or kV according to patient size and/or use of iterative reconstruction technique. COMPARISON:  CT abdomen pelvis dated 01/11/2022. FINDINGS: Evaluation of this exam is limited in the absence of intravenous contrast. CT CHEST FINDINGS Cardiovascular: There is no cardiomegaly or pericardial effusion. Three-vessel coronary vascular calcification. Advanced atherosclerotic calcification of the thoracic aorta. Mildly dilated distal descending thoracic aorta measure up to 3.8 cm in caliber. The central pulmonary arteries are grossly unremarkable on this noncontrast CT. Mediastinum/Nodes: No hilar or mediastinal adenopathy. The esophagus is grossly unremarkable. No mediastinal fluid collection. Lungs/Pleura: Background of centrilobular emphysema. There are bibasilar subpleural atelectasis/scarring. Linear scarring in the left upper lobe. No consolidative changes. There is no pleural effusion or pneumothorax. The central airways are patent. Musculoskeletal: Subacute or old fractures of the left third and fourth ribs without healing at this time. No acute osseous pathology. Lower thoracic spinal stimulator. CT ABDOMEN PELVIS FINDINGS No intra-abdominal free air or free fluid. Hepatobiliary: The liver is unremarkable. No biliary dilatation. Cholecystectomy. No retained calcified stone noted in the central CBD. Pancreas: Unremarkable. No pancreatic ductal dilatation or surrounding inflammatory changes. Spleen: Normal in size without focal abnormality. Adrenals/Urinary Tract: The adrenal glands are unremarkable. Moderate right renal parenchyma atrophy. Chronic mild right hydronephrosis with  transition at the ureteropelvic junction may represent a stricture. No stone identified. There is no hydronephrosis or nephrolithiasis on the left. The visualized ureters and urinary bladder appear unremarkable. Stomach/Bowel: There is sigmoid diverticulosis without active inflammatory changes. There is a 3 cm duodenal diverticulum. There is no bowel obstruction. Appendectomy. Vascular/Lymphatic: Advanced aortoiliac atherosclerotic disease. The IVC is unremarkable. No portal venous gas. There is no adenopathy. Reproductive: The uterus is grossly unremarkable. No adnexal masses. Other: None Musculoskeletal: Osteopenia with degenerative changes of the spine. Multilevel disc spacer and posterior fusion. No acute osseous pathology. IMPRESSION: 1. No acute intrathoracic, abdominal, or pelvic pathology. 2. Chronic mild right hydronephrosis with transition at the ureteropelvic junction may represent a stricture. No stone identified. 3. Sigmoid diverticulosis as well as duodenal diverticulum. No bowel obstruction. 4. Mildly dilated distal descending thoracic aorta measure up to 3.8 cm in caliber. 5. Aortic Atherosclerosis (ICD10-I70.0) and Emphysema (ICD10-J43.9). Electronically Signed   By: Anner Crete M.D.   On: 03/08/2022 01:05   CT HEAD WO CONTRAST (5MM)  Result Date: 03/08/2022 CLINICAL DATA:  Mental status change, unknown cause EXAM: CT HEAD WITHOUT CONTRAST TECHNIQUE: Contiguous axial images were obtained from the base of the skull through the vertex without intravenous contrast. RADIATION DOSE REDUCTION: This exam was performed according to the departmental dose-optimization program which includes automated exposure control, adjustment of the mA and/or kV according to patient size and/or use of iterative reconstruction technique. COMPARISON:  11/22/2015 FINDINGS: Brain: Normal anatomic configuration. Parenchymal volume loss is commensurate with the patient's age. Mild periventricular white matter changes  are present likely reflecting the sequela of small vessel ischemia. Left frontal cortical encephalomalacia is unchanged no abnormal intra or extra-axial mass lesion or fluid collection. No abnormal mass effect or midline shift. No evidence of acute intracranial hemorrhage or infarct. Ventricular size is normal. Cerebellum unremarkable. Vascular: No asymmetric hyperdense vasculature at the skull base. Skull: Intact Sinuses/Orbits: Paranasal sinuses are clear. Orbits are unremarkable. Other: Mastoid air cells and middle ear cavities  are clear. IMPRESSION: 1. No acute intracranial hemorrhage or infarct. 2. Stable left frontal cortical encephalomalacia. 3. Stable senescent change. Electronically Signed   By: Fidela Salisbury M.D.   On: 03/08/2022 00:57   DG Chest Port 1 View  Result Date: 03/07/2022 CLINICAL DATA:  Shortness of breath EXAM: PORTABLE CHEST 1 VIEW COMPARISON:  CXR 02/24/22 FINDINGS: No pleural effusion. No pneumothorax. Unchanged cardiac and mediastinal contours. Tortuous thoracic aorta. Prominent bilateral interstitial opacities in the more focal opacity in the peripheral left upper lung. Displaced rib fractures. Visualized upper abdomen is unremarkable. Spinal nerve stimulator in place. Degenerative changes of the [facility] and glenohumeral joints. IMPRESSION: Prominent bilateral interstitial opacities with a more focal opacity in the peripheral left upper lung. Findings are nonspecific, could represent findings related to atypical infection/small airways disease. Electronically Signed   By: Marin Roberts M.D.   On: 03/07/2022 19:49   Scheduled Meds:  anastrozole  1 mg Oral Daily   apixaban  2.5 mg Oral BID   DULoxetine  120 mg Oral Daily   guaiFENesin  1,200 mg Oral BID   insulin aspart  0-15 Units Subcutaneous Q4H   insulin glargine-yfgn  15 Units Subcutaneous Daily   ipratropium  0.5 mg Nebulization Q6H   isosorbide mononitrate  60 mg Oral QHS   levalbuterol  0.63 mg Nebulization Q6H    levothyroxine  150 mcg Oral Q0600   mirabegron ER  50 mg Oral Daily   pantoprazole  40 mg Oral Daily   rosuvastatin  40 mg Oral Daily   Continuous Infusions:  azithromycin Stopped (03/09/22 0402)   cefTRIAXone (ROCEPHIN)  IV Stopped (03/09/22 0115)    LOS: 1 day   Raiford Noble, DO [facility] Available via Epic secure chat 7am-7pm After these hours, please refer to coverage provider listed on [email or web address] 03/09/2022, 12:21 PM

## 2022-03-09 NOTE — Evaluation (Signed)
Physical Therapy Evaluation Patient Details Name: Tammy [name] MRN: [id number] DOB: 04-Jun-1945 Today's Date: 03/09/2022  History of Present Illness  The patient is an obese Caucasian female the past medical history significant for but not due to breast cancer status postmastectomy on anastrozole, history of lung cancer, history of chronic kidney disease stage IIIb, hypertension, PAD, history of PE on anticoagulation with apixaban, history of frequent urinary tract infections, history of diabetes mellitus type 2 on insulin, CAD as well as other comorbidities who was recently diagnosed with influenza A on 02/24/2022 and presented to the ED with persistent shortness of breath associate with weakness and lethargy.  On room air she was saturating 85%.  In the ED she is found to be tachycardic, tachypneic and had to be placed on supplemental oxygen via nasal cannula and blood pressure was low and dropped and had to be placed on Levophed short-term.  Clinical Impression  Pt is a pleasant 76 year old female who was admitted for ARF with hypoxia in addition to Flu. Pt performs transfers with min assist and ambulation with cga and HHA. WOuld benefit from use of RW in room as she uses one at baseline. Pt demonstrates deficits with strength/balance/mobility. O2 sats decrease and HR increased with limited exertion. Would benefit from skilled PT to address above deficits and promote optimal return to PLOF. Recommend transition to [place] upon discharge from acute hospitalization.        Recommendations for follow up therapy are one component of a multi-disciplinary discharge planning process, led by the attending physician.  Recommendations may be updated based on patient status, additional functional criteria and insurance authorization.  Follow Up Recommendations Home health PT      Assistance Recommended at Discharge Intermittent Supervision/Assistance  Patient can return home with the following  A little help  with walking and/or transfers;A little help with bathing/dressing/bathroom;Help with stairs or ramp for entrance    Equipment Recommendations None recommended by PT  Recommendations for Other Services       Functional Status Assessment Patient has had a recent decline in their functional status and demonstrates the ability to make significant improvements in function in a reasonable and predictable amount of time.     Precautions / Restrictions Precautions Precautions: Fall Restrictions Weight Bearing Restrictions: No      Mobility  Bed Mobility               General bed mobility comments: NT, received in recliner    Transfers Overall transfer level: Needs assistance Equipment used: 1 person hand held assist Transfers: Sit to/from Stand Sit to Stand: Min assist           General transfer comment: needs assist from low surface. Once standing, upright posture noted    Ambulation/Gait Ambulation/Gait assistance: Min guard Gait Distance (Feet): 20 Feet Assistive device: 1 person hand held assist Gait Pattern/deviations: Step-to pattern       General Gait Details: ambulated to bathroom and back to chair. No RW available, used HHA. Pt with incontinent episode  Stairs            Wheelchair Mobility    Modified Rankin (Stroke Patients Only)       Balance Overall balance assessment: Needs assistance Sitting-balance support: Feet supported Sitting balance-Leahy Scale: Good     Standing balance support: Bilateral upper extremity supported Standing balance-Leahy Scale: Fair  Pertinent Vitals/Pain Pain Assessment Pain Assessment: No/denies pain    Home Living Family/patient expects to be discharged to:: Private residence Living Arrangements: Spouse/significant other Available Help at Discharge: Family Type of Home: House         Home Layout: Two level;Able to live on main level with  bedroom/bathroom Home Equipment: Rolling Walker (2 wheels);Cane - single point;Electric scooter      Prior Function Prior Level of Function : Needs assist             Mobility Comments: uses RW for all mobility. Reports several falls. No O2 at baseline ADLs Comments: generally indep with ADLs     Hand Dominance        Extremity/Trunk Assessment   Upper Extremity Assessment Upper Extremity Assessment: Generalized weakness (B UE grossly 3+/5)    Lower Extremity Assessment Lower Extremity Assessment: Generalized weakness (B LE grossly 4/5)       Communication   Communication: No difficulties  Cognition Arousal/Alertness: Awake/alert Behavior During Therapy: WFL for tasks assessed/performed Overall Cognitive Status: Within Functional Limits for tasks assessed                                          General Comments      Exercises Other Exercises Other Exercises: ambulated to bathroom. O2 removed with sats at 84%. Quickly improved with cues for PLB Other Exercises: HR elevated with exertion to 150s with short distance ambulation. Further distance deferred   Assessment/Plan    PT Assessment Patient needs continued PT services  PT Problem List Decreased strength;Decreased activity tolerance;Decreased balance;Decreased mobility       PT Treatment Interventions Gait training;Therapeutic activities;Therapeutic exercise;Balance training;DME instruction    PT Goals (Current goals can be found in the Care Plan section)  Acute Rehab PT Goals Patient Stated Goal: to go home PT Goal Formulation: With patient Time For Goal Achievement: 03/23/22 Potential to Achieve Goals: Good    Frequency Min 2X/week     Co-evaluation               AM-PAC PT "6 Clicks" Mobility  Outcome Measure Help needed turning from your back to your side while in a flat bed without using bedrails?: None Help needed moving from lying on your back to sitting on the side  of a flat bed without using bedrails?: A Little Help needed moving to and from a bed to a chair (including a wheelchair)?: A Little Help needed standing up from a chair using your arms (e.g., wheelchair or bedside chair)?: A Little Help needed to walk in hospital room?: A Little Help needed climbing 3-5 steps with a railing? : A Lot 6 Click Score: 18    End of Session Equipment Utilized During Treatment: Oxygen Activity Tolerance: Patient tolerated treatment well Patient left: in chair;with nursing/sitter in room Nurse Communication: Mobility status PT Visit Diagnosis: Unsteadiness on feet (R26.81);Muscle weakness (generalized) (M62.81);History of falling (Z91.81);Difficulty in walking, not elsewhere classified (R26.2)    Time: 0865-7846 PT Time Calculation (min) (ACUTE ONLY): 16 min   Charges:   PT Evaluation $PT Eval Low Complexity: 1 Low          Greggory Stallion, PT, DPT, GCS [phone]   Alaster Asfaw 03/09/2022, 4:50 PM

## 2022-03-09 NOTE — ED Notes (Signed)
Pt calls out to state her blood sugar was 53 per her own continuous 10 minutes after this RN checked CBG per our glucometer. Pt is no distress pt is alert, this RN brought OJ and apple juice top bed side, pt argued with this RN about drinking and asked if we had fruit and pt educated we did not. Pt encouraged to drink the juice and breakfast was brought into room as well. Pt eating at this time.

## 2022-03-10 ENCOUNTER — Inpatient Hospital Stay (HOSPITAL_COMMUNITY)
Admit: 2022-03-10 | Discharge: 2022-03-10 | Disposition: A | Discharge status: Home / Self Care | Payer: Medicare Other | Attending: Cardiovascular Disease | Admitting: Cardiovascular Disease

## 2022-03-10 ENCOUNTER — Inpatient Hospital Stay: Payer: Medicare Other

## 2022-03-10 DIAGNOSIS — R0609 Other forms of dyspnea: Secondary | ICD-10-CM

## 2022-03-10 DIAGNOSIS — J09X1 Influenza due to identified novel influenza A virus with pneumonia: Secondary | ICD-10-CM | POA: Diagnosis not present

## 2022-03-10 DIAGNOSIS — J449 Chronic obstructive pulmonary disease, unspecified: Secondary | ICD-10-CM | POA: Diagnosis not present

## 2022-03-10 DIAGNOSIS — I25110 Atherosclerotic heart disease of native coronary artery with unstable angina pectoris: Secondary | ICD-10-CM | POA: Diagnosis not present

## 2022-03-10 DIAGNOSIS — I471 Supraventricular tachycardia, unspecified: Secondary | ICD-10-CM | POA: Diagnosis not present

## 2022-03-10 DIAGNOSIS — J9601 Acute respiratory failure with hypoxia: Secondary | ICD-10-CM | POA: Diagnosis not present

## 2022-03-10 LAB — ECHOCARDIOGRAM COMPLETE
AR max vel: 2.47 cm2
AV Area VTI: 2.28 cm2
AV Area mean vel: 2.33 cm2
AV Mean grad: 4 mmHg
AV Peak grad: 7.6 mmHg
Ao pk vel: 1.38 m/s
Area-P 1/2: 3.95 cm2
Height: 66 in
MV VTI: 2.83 cm2
S' Lateral: 3.1 cm
Weight: 3315.72 oz

## 2022-03-10 LAB — CBC WITH DIFFERENTIAL/PLATELET
Abs Immature Granulocytes: 0.03 10*3/uL (ref 0.00–0.07)
Basophils Absolute: 0 10*3/uL (ref 0.0–0.1)
Basophils Relative: 0 %
Eosinophils Absolute: 0.2 10*3/uL (ref 0.0–0.5)
Eosinophils Relative: 3 %
HCT: 38.9 % (ref 36.0–46.0)
Hemoglobin: 12.2 g/dL (ref 12.0–15.0)
Immature Granulocytes: 1 %
Lymphocytes Relative: 25 %
Lymphs Abs: 1.5 10*3/uL (ref 0.7–4.0)
MCH: 28.2 pg (ref 26.0–34.0)
MCHC: 31.4 g/dL (ref 30.0–36.0)
MCV: 90 fL (ref 80.0–100.0)
Monocytes Absolute: 0.5 10*3/uL (ref 0.1–1.0)
Monocytes Relative: 8 %
Neutro Abs: 3.9 10*3/uL (ref 1.7–7.7)
Neutrophils Relative %: 63 %
Platelets: 219 10*3/uL (ref 150–400)
RBC: 4.32 MIL/uL (ref 3.87–5.11)
RDW: 15.2 % (ref 11.5–15.5)
WBC: 6.1 10*3/uL (ref 4.0–10.5)
nRBC: 0 % (ref 0.0–0.2)

## 2022-03-10 LAB — COMPREHENSIVE METABOLIC PANEL
ALT: 14 U/L (ref 0–44)
AST: 26 U/L (ref 15–41)
Albumin: 2.4 g/dL — ABNORMAL LOW (ref 3.5–5.0)
Alkaline Phosphatase: 61 U/L (ref 38–126)
Anion gap: 9 (ref 5–15)
BUN: 16 mg/dL (ref 8–23)
CO2: 21 mmol/L — ABNORMAL LOW (ref 22–32)
Calcium: 7.3 mg/dL — ABNORMAL LOW (ref 8.9–10.3)
Chloride: 111 mmol/L (ref 98–111)
Creatinine, Ser: 1.15 mg/dL — ABNORMAL HIGH (ref 0.44–1.00)
GFR, Estimated: 49 mL/min — ABNORMAL LOW (ref >60)
Glucose, Bld: 69 mg/dL — ABNORMAL LOW (ref 70–99)
Potassium: 4.1 mmol/L (ref 3.5–5.1)
Sodium: 141 mmol/L (ref 135–145)
Total Bilirubin: 0.4 mg/dL (ref 0.3–1.2)
Total Protein: 5.6 g/dL — ABNORMAL LOW (ref 6.5–8.1)

## 2022-03-10 LAB — GLUCOSE, CAPILLARY
Glucose-Capillary: 110 mg/dL — ABNORMAL HIGH (ref 70–99)
Glucose-Capillary: 137 mg/dL — ABNORMAL HIGH (ref 70–99)
Glucose-Capillary: 72 mg/dL (ref 70–99)
Glucose-Capillary: 86 mg/dL (ref 70–99)

## 2022-03-10 LAB — MAGNESIUM: Magnesium: 2.5 mg/dL — ABNORMAL HIGH (ref 1.7–2.4)

## 2022-03-10 LAB — PHOSPHORUS: Phosphorus: 2.6 mg/dL (ref 2.5–4.6)

## 2022-03-10 MED ORDER — GUAIFENESIN ER 600 MG PO TB12: 600.0000 mg | ORAL_TABLET | Freq: Two times a day (BID) | ORAL | 0 refills | Status: AC

## 2022-03-10 MED ORDER — AZITHROMYCIN 250 MG PO TABS: 500.0000 mg | ORAL_TABLET | Freq: Every day | ORAL | Status: DC

## 2022-03-10 MED ORDER — ONDANSETRON HCL 4 MG PO TABS: 4.0000 mg | ORAL_TABLET | Freq: Four times a day (QID) | ORAL | 0 refills | Status: DC | PRN

## 2022-03-10 MED ORDER — CEFUROXIME AXETIL 500 MG PO TABS
500.0000 mg | ORAL_TABLET | Freq: Two times a day (BID) | ORAL | Status: DC
  Filled 2022-03-10: qty 1

## 2022-03-10 MED ORDER — ACETAMINOPHEN 325 MG PO TABS: 650.0000 mg | ORAL_TABLET | Freq: Four times a day (QID) | ORAL | 0 refills | Status: DC | PRN

## 2022-03-10 MED ORDER — METOPROLOL TARTRATE 25 MG PO TABS: 25.0000 mg | ORAL_TABLET | Freq: Two times a day (BID) | ORAL | 0 refills | Status: DC

## 2022-03-10 MED ORDER — AZITHROMYCIN 250 MG PO TABS: 500.0000 mg | ORAL_TABLET | Freq: Every day | ORAL | 0 refills | Status: AC

## 2022-03-10 MED ORDER — CEFUROXIME AXETIL 500 MG PO TABS: 500.0000 mg | ORAL_TABLET | Freq: Two times a day (BID) | ORAL | 0 refills | Status: AC

## 2022-03-10 NOTE — Progress Notes (Signed)
*  PRELIMINARY RESULTS* Echocardiogram 2D Echocardiogram has been performed.  Tammy [name] 03/10/2022, 12:25 PM

## 2022-03-10 NOTE — Progress Notes (Signed)
Rounding Note    Patient Name: FUJIE DICKISON Date of Encounter: 03/10/2022  [place] Cardiologist: Ida Rogue, MD   Subjective   She feels significantly better with less shortness of breath and palpitations.  Heart rate improved with metoprolol.  Inpatient Medications    Scheduled Meds:  anastrozole  1 mg Oral Daily   apixaban  2.5 mg Oral BID   DULoxetine  120 mg Oral Daily   guaiFENesin  1,200 mg Oral BID   insulin aspart  0-15 Units Subcutaneous Q4H   insulin glargine-yfgn  15 Units Subcutaneous Daily   ipratropium  0.5 mg Nebulization Q6H   isosorbide mononitrate  60 mg Oral QHS   levalbuterol  0.63 mg Nebulization Q6H   levothyroxine  150 mcg Oral Q0600   metoprolol tartrate  25 mg Oral BID   mirabegron ER  50 mg Oral Daily   pantoprazole  40 mg Oral Daily   phosphorus  500 mg Oral BID   rosuvastatin  40 mg Oral Daily   Continuous Infusions:  azithromycin Stopped (03/10/22 0347)   cefTRIAXone (ROCEPHIN)  IV Stopped (03/10/22 0053)   PRN Meds: acetaminophen **OR** acetaminophen, albuterol, benzonatate, nitroGLYCERIN, ondansetron **OR** ondansetron (ZOFRAN) IV, oxyCODONE-acetaminophen   Vital Signs    Vitals:   03/09/22 2100 03/09/22 2247 03/10/22 0258 03/10/22 0623  BP: (!) 112/52 120/70  121/65  Pulse: 96 90  90  Resp: 18 18  18   Temp: 98.5 F (36.9 C) 97.7 F (36.5 C)  97.7 F (36.5 C)  TempSrc:  Oral    SpO2: 99% 96% 94% 92%  Weight:      Height:        Intake/Output Summary (Last 24 hours) at 03/10/2022 0828 Last data filed at 03/10/2022 0427 Gross per 24 hour  Intake 600 ml  Output --  Net 600 ml      03/07/2022    7:07 PM 02/24/2022    7:10 PM 02/16/2022    2:09 PM  Last 3 Weights  Weight (lbs) 207 lb 3.7 oz 208 lb 15.9 oz 209 lb  Weight (kg) 94 kg 94.8 kg 94.802 kg      Telemetry    Sinus rhythm with heart rate of 96 bpm.  Frequent PACs, intermittent sinus tachycardia and short runs of SVT lasting less than 10  seconds- Personally Reviewed  ECG     - Personally Reviewed  Physical Exam   GEN: No acute distress.   Neck: No JVD Cardiac: RRR, no murmurs, rubs, or gallops.  Respiratory: Clear to auscultation bilaterally. GI: Soft, nontender, non-distended  MS: No edema; No deformity. Neuro:  Nonfocal  Psych: Normal affect   Labs    High Sensitivity Troponin:   Recent Labs  Lab 02/24/22 1912 03/07/22 1917 03/07/22 2335  TROPONINIHS 11 11 12      Chemistry Recent Labs  Lab 03/06/22 2335 03/07/22 1917 03/08/22 0424 03/08/22 1045 03/09/22 1039  NA  --  140 146*  --  141  K  --  5.3* 4.5  --  4.3  CL  --  109 114*  --  112*  CO2  --  22 22  --  22  GLUCOSE  --  169* 86  --  149*  BUN  --  35* 40*  --  21  CREATININE  --  1.84* 1.84*  --  1.24*  CALCIUM  --  8.6* 8.3*  --  7.4*  MG 2.1  --   --  2.0  1.5*  PROT  --   --   --  5.7* 5.7*  ALBUMIN  --   --   --  2.3* 2.3*  AST  --   --   --  22 27  ALT  --   --   --  15 17  ALKPHOS  --   --   --  61 67  BILITOT  --   --   --  0.4 0.4  GFRNONAA  --  28* 28*  --  45*  ANIONGAP  --  9 10  --  7    Lipids No results for input(s): "CHOL", "TRIG", "HDL", "LABVLDL", "LDLCALC", "CHOLHDL" in the last 168 hours.  Hematology Recent Labs  Lab 03/07/22 1917 03/08/22 0424 03/09/22 1039  WBC 12.1* 10.6* 8.0  RBC 5.62* 4.93 4.43  HGB 15.7* 13.9 12.4  HCT 51.5* 45.5 40.6  MCV 91.6 92.3 91.6  MCH 27.9 28.2 28.0  MCHC 30.5 30.5 30.5  RDW 14.9 15.0 15.4  PLT 301 271 222   Thyroid No results for input(s): "TSH", "FREET4" in the last 168 hours.  BNP Recent Labs  Lab 03/07/22 1917  BNP 58.6    DDimer  Recent Labs  Lab 03/09/22 1039  DDIMER 2.08*     Radiology    DG Chest Port 1 View  Result Date: 03/10/2022 CLINICAL DATA:  76 year old female with shortness of breath. EXAM: PORTABLE CHEST 1 VIEW COMPARISON:  Portable chest 03/09/2022 and earlier. FINDINGS: Portable AP semi upright view at 0510 hours. Stable cardiac size and  mediastinal contours. Tortuous descending thoracic aorta. Thoracic spinal stimulator. Large lung volumes with centrilobular emphysema demonstrated by CT 03/08/2022. Left upper lobe curvilinear scarring redemonstrated. No pneumothorax, pulmonary edema, pleural effusion, or acute pulmonary opacity. Paucity of bowel gas in the visible abdomen. Stable visualized osseous structures. IMPRESSION: Emphysema (ICD10-J43.9) and left upper lobe scarring. No acute cardiopulmonary abnormality. Electronically Signed   By: Genevie Ann M.D.   On: 03/10/2022 05:43   DG Chest Port 1 View  Result Date: 03/09/2022 CLINICAL DATA:  Shortness of breath. EXAM: PORTABLE CHEST 1 VIEW COMPARISON:  Chest radiograph dated 03/08/2022. FINDINGS: Left lung base atelectasis. No focal consolidation, pleural effusion or pneumothorax. Top-normal cardiac size. Atherosclerotic calcification of the aortic arch. No acute osseous pathology. IMPRESSION: No acute cardiopulmonary process. Electronically Signed   By: Anner Crete M.D.   On: 03/09/2022 01:35    Cardiac Studies     Patient Profile     76 y.o. 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  Assessment & Plan    1.  Sinus tachycardia and short runs of SVT: Likely due to acute illness and beta-blocker withdrawal given that she was on bisoprolol 10 mg twice daily as an outpatient.  Given soft blood pressure, we elected to switch bisoprolol to metoprolol which was started yesterday with improvement in heart rate.  This should be continued.  2.  Coronary artery disease: Chronically occluded right coronary artery with collaterals on cardiac cath in  July of this year.  No significant anginal symptoms at the present time.  Continue medical therapy with Imdur, metoprolol and rosuvastatin.  She is not on aspirin given that she is on Eliquis.  3.  History of DVT/PE: On Eliquis.  4.  Influenza A: Symptomatically she seems to be improved with improvement in oxygen saturation currently not requiring supplemental oxygen.     An echocardiogram was requested yesterday.  However, if the patient is deemed to be ready for discharge from a medical standpoint, this does not have to be done before discharge.  For questions or updates, please contact [place] Please consult [email or web address] for contact info under        Signed, Kathlyn Sacramento, MD  03/10/2022, 8:28 AM

## 2022-03-10 NOTE — Progress Notes (Signed)
DC instructions, med list, flu information provided and reviewed with pt and SO.  IV and tele removed.  Pt discharged  via wheelchair without incident

## 2022-03-10 NOTE — Care Management Important Message (Signed)
Important Message  Patient Details  Name: Tammy [name] MRN: [id number] Date of Birth: [birth year]   Medicare Important Message Given:  Yes     Dannette Barbara 03/10/2022, 1:41 PM

## 2022-03-10 NOTE — TOC Initial Note (Signed)
Transition of Care [facility]) - Initial/Assessment Note    Patient Details  Name: Tammy [name] MRN: [id number] Date of Birth: May 16, 1945  [facility]) CM/SW Contact:    Tiburcio Bash, LCSW Phone Number: 03/10/2022, 10:12 AM  Clinical Narrative:                  CSW spoke with patient regarding home health PT recommendations, she's in agreement. Reports no agency preference. States she has RW at home already, continue to go to Dr. Alvin Critchley as her PCP. No other needs at this time.   Referral given to [facility] with [facility].   Expected Discharge Plan: [place] Barriers to Discharge: Continued Medical Work up   Patient Goals and CMS Choice Patient states their goals for this hospitalization and ongoing recovery are:: to go home CMS Medicare.gov Compare Post Acute Care list provided to:: Patient Choice offered to / list presented to : Patient      Expected Discharge Plan and Services       Living arrangements for the past 2 months: Single Family Home                           HH Arranged: PT          Prior Living Arrangements/Services Living arrangements for the past 2 months: Single Family Home Lives with:: Self                   Activities of Daily Living      Permission Sought/Granted                  Emotional Assessment              Admission diagnosis:  Hyperkalemia [E87.5] Premature atrial contractions [I49.1] Influenza A [J10.1] Acute respiratory failure with hypoxia (HCC) [J96.01] Viral pneumonia [J12.9] Acute sepsis [facility]) [A41.9] Patient Active Problem List   Diagnosis Date Noted   Influenza A with pneumonia 36/14/4315   Acute metabolic encephalopathy 40/10/6759   Primary cancer of left upper lobe of lung ([place]) 01/10/2022   Osteoarthritis of spine with radiculopathy, cervical region 10/20/2021   Osteoarthritis of spine with radiculopathy, thoracic region 10/20/2021   Acute respiratory failure  with hypoxia ([place]) 06/09/2021   Chronic anticoagulation 06/09/2021   Elevated lipase 06/09/2021   Stage 3b chronic kidney disease ([place]) 06/08/2021   Atherosclerosis of native arteries of extremity with intermittent claudication ([place]) 02/15/2021   Cervical radiculopathy 10/14/2020   Sacroiliac inflammation ([place]) 10/14/2020   Lung nodule 08/19/2020   Fall 04/17/2020   Anemia 02/16/2020   Asthma 02/16/2020   History of cardiac catheterization 02/16/2020   Left breast cancer s/p mastectomy 02/16/2020   Malignant tumor of breast ([place]) 02/16/2020   History of total knee arthroplasty 11/18/2019   Unstable angina ([place]) 04/03/2019   Dysphagia    Thickening of esophagus    Stomach irritation    Gastric polyp    Positive occult stool blood test    Polyp of colon    Hyperkalemia 11/27/2018   Proteinuria 11/27/2018   Diabetes mellitus, type II ([place]) 11/27/2018   Carcinoma of lower-inner quadrant of left breast in female, estrogen receptor positive ([place]) 10/02/2018   Right knee pain 08/01/2017   Chronic right-sided low back pain with right-sided sciatica 05/28/2017   Gait abnormality 05/28/2017   Hypothyroid 07/18/2016   COPD (chronic obstructive pulmonary disease) ([place]) 07/18/2016   Encephalomalacia on imaging  study 12/10/2015   Calcification of both carotid arteries 12/10/2015   Benign neoplasm of sigmoid colon    Benign neoplasm of cecum    Benign neoplasm of ascending colon    Bilateral atelectasis 04/11/2015   Fatty liver disease, nonalcoholic 62/70/3500   Hydronephrosis of right kidney 04/11/2015   Abdominal aortic ectasia (HCC) 04/11/2015   Constipation 04/07/2015   Abdominal aortic atherosclerosis ([place]) 12/18/2014   Diverticulosis of colon 12/18/2014   Intermittent left-sided chest pain 11/18/2014   Rhinitis, allergic 10/20/2014   Coronary artery disease    Essential hypertension    Morbid obesity due to excess calories ([place])    Spinal stenosis of lumbar region 06/06/2014    Herniated nucleus pulposus 06/06/2014   Neuralgia neuritis, sciatic nerve 05/24/2014   Pulmonary embolism ([place]) 01/30/2014   Benign hypertensive renal disease 12/16/2013   Uncontrolled type 2 diabetes mellitus with hyperglycemia, with long-term current use of insulin ([place]) 12/16/2013   Overactive bladder 11/03/2013   Coronary atherosclerosis of native coronary artery 06/19/2013   Exertional dyspnea 06/19/2013   Chronic back pain 06/19/2013   History of back surgery 06/19/2013   Hyperlipidemia 06/19/2013   PAD (peripheral artery disease) ([place]) 06/19/2013   Congenital obstruction of ureteropelvic junction 11/21/2011   PCP:  [name], DO Pharmacy:   [place], [place] - [place] [address] Phone: [phone] Fax: [phone]  [facility] [place], [employer] [address] [place] [address] Phone: [phone] Fax: [phone]  [id number] - [place], [place] [address] [place] [address] Phone: [phone] Fax: [phone]     Social Determinants of Health (SDOH) Social History: SDOH Screenings   Food Insecurity: No Food Insecurity (02/28/2022)  Housing: Low Risk  (02/28/2022)  Transportation Needs: No Transportation Needs (02/28/2022)  Utilities: Not At Risk (12/26/2021)  Alcohol Screen: Low Risk  (12/26/2021)  Depression (PHQ2-9): Low Risk  (01/03/2022)  Recent Concern: Depression (PHQ2-9) - Medium Risk (11/08/2021)  Financial Resource Strain: Low Risk  (12/26/2021)  Physical Activity: Inactive (12/26/2021)  Social Connections: Socially Integrated (12/28/2020)  Stress: No Stress Concern Present (12/26/2021)  Tobacco Use: Medium Risk (03/07/2022)   SDOH Interventions:     Readmission Risk Interventions     No data to display

## 2022-03-10 NOTE — Evaluation (Signed)
Occupational Therapy Evaluation Patient Details Name: Tammy [name] MRN: [id number] DOB: 1945/04/26 Today's Date: 03/10/2022   History of Present Illness The patient is a 76 y/o female the past medical history significant for breast cancer status postmastectomy on anastrozole, history of lung cancer, history of chronic kidney disease stage IIIb, hypertension, PAD, history of PE on anticoagulation with apixaban, history of frequent urinary tract infections, history of diabetes mellitus type 2 on insulin, CAD as well as other comorbidities who was recently diagnosed with influenza A on 02/24/2022 and presented to the ED with persistent shortness of breath associate with weakness and lethargy.   Clinical Impression   Patient received for OT evaluation. See flowsheet below for details of function. Generally, patient MOD (I) with RW for functional mobility, and MOD (I) for ADLs. Husband in room and pt states he can assist her with dressing in a few minutes; is preparing for d/c. O2 remaining above 89% throughout session.  Patient with no further need for OT in acute care; discharge OT services.      Recommendations for follow up therapy are one component of a multi-disciplinary discharge planning process, led by the attending physician.  Recommendations may be updated based on patient status, additional functional criteria and insurance authorization.   Follow Up Recommendations  No OT follow up     Assistance Recommended at Discharge Intermittent Supervision/Assistance  Patient can return home with the following A little help with bathing/dressing/bathroom;Assistance with cooking/housework;Direct supervision/assist for medications management;Direct supervision/assist for financial management;Assist for transportation;Help with stairs or ramp for entrance    Functional Status Assessment  Patient has not had a recent decline in their functional status  Equipment Recommendations  None recommended by  OT    Recommendations for Other Services       Precautions / Restrictions Precautions Precautions: Fall Restrictions Weight Bearing Restrictions: No      Mobility Bed Mobility               General bed mobility comments: Pt received in chair    Transfers Overall transfer level: Modified independent Equipment used: Rolling walker (2 wheels) Transfers: Sit to/from Stand Sit to Stand: Modified independent (Device/Increase time)           General transfer comment: Used RW.      Balance Overall balance assessment: Modified Independent                                         ADL either performed or assessed with clinical judgement   ADL Overall ADL's : At baseline                                       General ADL Comments: Pt demonstrated ability to walk to bathroom with RW, t/f to toilet with grab bar, perform toilet hygiene, perform sit to stand t/f from toilet with grab bar, and perform hand hygiene at sink, all MOD (I). O2 monitored at start and while seated on toilet; pt O2 92-95% at rest while seated in recliner, then seated on toilet after mobility O2 89% and quickly back to 92%; eventually to 95% after 1 minute.     Vision         Perception     Praxis      Pertinent Vitals/Pain Pain Assessment Pain  Assessment: No/denies pain     Hand Dominance     Extremity/Trunk Assessment Upper Extremity Assessment Upper Extremity Assessment: Overall WFL for tasks assessed   Lower Extremity Assessment Lower Extremity Assessment: Overall WFL for tasks assessed       Communication Communication Communication: No difficulties   Cognition Arousal/Alertness: Awake/alert Behavior During Therapy: WFL for tasks assessed/performed Overall Cognitive Status: Within Functional Limits for tasks assessed                                 General Comments: Pleasant and motivated     General Comments        Exercises     Shoulder Instructions      Home Living Family/patient expects to be discharged to:: Private residence Living Arrangements: Spouse/significant other Available Help at Discharge: Family Type of Home: House       Home Layout: Multi-level;Able to live on main level with bedroom/bathroom     Bathroom Shower/Tub: Occupational psychologist: Handicapped height Bathroom Accessibility: Yes How Accessible: Accessible via wheelchair Home Equipment: Conservation officer, nature (2 wheels);Cane - single point;Electric scooter;Grab bars - toilet;Grab bars - tub/shower          Prior Functioning/Environment Prior Level of Function : Needs assist       Physical Assist : ADLs (physical)   ADLs (physical): IADLs;Dressing Mobility Comments: uses RW or cane for mobility most of the time, she states. Reports several falls. No O2 at baseline. ADLs Comments: usually (I); pt states that sometimes husband has to help her with LB dressing; pt states that she has intermittent weakness/fatigue. Most of the time is (I). 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        OT Problem List: Decreased activity tolerance      OT Treatment/Interventions:      OT Goals(Current goals can be found in the care plan section) Acute Rehab OT Goals Patient Stated Goal: Go home OT Goal Formulation: With patient  OT Frequency:      Co-evaluation              AM-PAC OT "6 Clicks" Daily Activity     Outcome Measure Help from another person eating meals?: None Help from another person taking care of personal grooming?: None Help from another person toileting, which includes using toliet, bedpan, or urinal?: None Help from another person bathing (including washing, rinsing, drying)?: None Help from another person to put on and taking off regular upper body clothing?: None Help from another person to put on and taking off regular lower body clothing?: None 6 Click  Score: 24   End of Session Equipment Utilized During Treatment: Rolling walker (2 wheels) Nurse Communication: Mobility status  Activity Tolerance: Patient tolerated treatment well Patient left: in chair;with family/visitor present  OT Visit Diagnosis: Unsteadiness on feet (R26.81)                Time: 5643-3295 OT Time Calculation (min): 27 min Charges:  OT General Charges $OT Visit: 1 Visit OT Evaluation $OT Eval Moderate Complexity: 1 Mod  Dyan Creelman Carleene Mains, MS, OTR/L   [name] 03/10/2022, 3:22 PM

## 2022-03-10 NOTE — Inpatient Diabetes Management (Addendum)
Inpatient Diabetes Program Recommendations  AACE/ADA: New Consensus Statement on Inpatient Glycemic Control  Target Ranges:  Prepandial:   less than 140 mg/dL      Peak postprandial:   less than 180 mg/dL (1-2 hours)      Critically ill patients:  140 - 180 mg/dL    Latest Reference Range & Units 03/10/22 00:24 03/10/22 02:50 03/10/22 09:05 03/10/22 12:26  Glucose-Capillary 70 - 99 mg/dL 110 (H) 86 72 137 (H)  Novolog  2 units @12 :29    Latest Reference Range & Units 03/09/22 04:47 03/09/22 07:42 03/09/22 10:43 03/09/22 13:51 03/09/22 18:37  Glucose-Capillary 70 - 99 mg/dL 108 (H) 86      Semglee 15 units 145 (H)  Novolog 2 units @13 :51 97   Review of Glycemic Control  Diabetes history: DM2 Outpatient Diabetes medications: Tresiba 50-09 units QHS, Trulicity 4.5 mg Qweek, Jardiance 25 mg daily, Glipizide 5 mg QAM Current orders for Inpatient glycemic control: Semglee 15 units daily, Novolog 0-15 units Q4H  Inpatient Diabetes Program Recommendations:    Insulin: Semglee 15 units given on 03/09/22 and fasting CBG 72 mg/dl today. Semglee was not given today. May want to consider discontinuing Semglee and could consider reordering if CBGs become consistently over 180 mg/dl with Novolog correction insulin.  Addendum 03/10/22@15 :15-Received chat from Dr. Alfredia Ferguson regarding recommendations for DM medications at discharge. Spoke with patient over the phone to confirm DM medications and DM control as outpatient. Patient states that she is taking Tresiba 20-40 units QHS (dose depends on glucose before bedtime), Trulicity 4.5 mg Qweek, Jardiance 25 mg daily, and Glipizide 5 mg QAM. Patient reports that she does not skip the [place] that she typically takes at least 20 units before bedtime. Patient reports that she experiences hypoglycemia if she takes the 40 units of Tresiba when glucose is in normal ranges before bed. Patient reports that her urologist wanted her to stop taking the Jardiance but  she was planning to finish up the Jardiance she has at home and then stop it. Patient was checking glucose 1 time per day and just started using the [employer] CGM sensor 2 months ago. Patient states that she is able to follow glucose much closer now. Inquired if patient had symptoms of hypoglycemia today when CBG was 72 mg/dl and patient states she did not have any symptoms of hypoglycemia that she could tell this morning. Patient reports that she feels her glucose has been lower here in the hospital because she has not ate very much at all because she did not like the food. She reports she does not have hypoglycemia much during the day and episodes at night are not very often. Patient has not followed up with PCP since being started on the [place] CGM. Encouraged patient to follow up with PCP in the near future and be sure they review the [place] app she is using on her phone so they can use the information to adjust DM medications if needed. Patient verbalized understanding of information discussed and has no questions at this time related to DM. For discharge, would recommend stopping Jardiance and have patient follow up with PCP so they can adjust DM medications further if needed   Thanks, Barnie Alderman, RN, MSN, [place] Diabetes Coordinator Inpatient Diabetes Program [phone] (Team Pager from 8am to 5pm)

## 2022-03-12 DIAGNOSIS — I2510 Atherosclerotic heart disease of native coronary artery without angina pectoris: Secondary | ICD-10-CM | POA: Diagnosis not present

## 2022-03-12 DIAGNOSIS — E1169 Type 2 diabetes mellitus with other specified complication: Secondary | ICD-10-CM

## 2022-03-12 DIAGNOSIS — Z170 Estrogen receptor positive status [ER+]: Secondary | ICD-10-CM

## 2022-03-12 DIAGNOSIS — Z794 Long term (current) use of insulin: Secondary | ICD-10-CM

## 2022-03-12 DIAGNOSIS — C50312 Malignant neoplasm of lower-inner quadrant of left female breast: Secondary | ICD-10-CM | POA: Diagnosis not present

## 2022-03-13 DIAGNOSIS — C50919 Malignant neoplasm of unspecified site of unspecified female breast: Secondary | ICD-10-CM | POA: Diagnosis not present

## 2022-03-13 DIAGNOSIS — Z794 Long term (current) use of insulin: Secondary | ICD-10-CM | POA: Diagnosis not present

## 2022-03-13 DIAGNOSIS — E8809 Other disorders of plasma-protein metabolism, not elsewhere classified: Secondary | ICD-10-CM | POA: Diagnosis not present

## 2022-03-13 DIAGNOSIS — E1151 Type 2 diabetes mellitus with diabetic peripheral angiopathy without gangrene: Secondary | ICD-10-CM | POA: Diagnosis not present

## 2022-03-13 DIAGNOSIS — I129 Hypertensive chronic kidney disease with stage 1 through stage 4 chronic kidney disease, or unspecified chronic kidney disease: Secondary | ICD-10-CM | POA: Diagnosis not present

## 2022-03-13 DIAGNOSIS — E669 Obesity, unspecified: Secondary | ICD-10-CM | POA: Diagnosis not present

## 2022-03-13 DIAGNOSIS — J09X1 Influenza due to identified novel influenza A virus with pneumonia: Secondary | ICD-10-CM | POA: Diagnosis not present

## 2022-03-13 DIAGNOSIS — K5790 Diverticulosis of intestine, part unspecified, without perforation or abscess without bleeding: Secondary | ICD-10-CM | POA: Diagnosis not present

## 2022-03-13 DIAGNOSIS — K760 Fatty (change of) liver, not elsewhere classified: Secondary | ICD-10-CM | POA: Diagnosis not present

## 2022-03-13 DIAGNOSIS — Z6833 Body mass index (BMI) 33.0-33.9, adult: Secondary | ICD-10-CM | POA: Diagnosis not present

## 2022-03-13 DIAGNOSIS — J439 Emphysema, unspecified: Secondary | ICD-10-CM | POA: Diagnosis not present

## 2022-03-13 DIAGNOSIS — G8929 Other chronic pain: Secondary | ICD-10-CM | POA: Diagnosis not present

## 2022-03-13 DIAGNOSIS — J9601 Acute respiratory failure with hypoxia: Secondary | ICD-10-CM | POA: Diagnosis not present

## 2022-03-13 DIAGNOSIS — E1122 Type 2 diabetes mellitus with diabetic chronic kidney disease: Secondary | ICD-10-CM | POA: Diagnosis not present

## 2022-03-13 DIAGNOSIS — I2510 Atherosclerotic heart disease of native coronary artery without angina pectoris: Secondary | ICD-10-CM | POA: Diagnosis not present

## 2022-03-13 DIAGNOSIS — N1832 Chronic kidney disease, stage 3b: Secondary | ICD-10-CM | POA: Diagnosis not present

## 2022-03-13 DIAGNOSIS — Z86711 Personal history of pulmonary embolism: Secondary | ICD-10-CM | POA: Diagnosis not present

## 2022-03-13 DIAGNOSIS — Z79891 Long term (current) use of opiate analgesic: Secondary | ICD-10-CM | POA: Diagnosis not present

## 2022-03-13 DIAGNOSIS — G9341 Metabolic encephalopathy: Secondary | ICD-10-CM | POA: Diagnosis not present

## 2022-03-13 DIAGNOSIS — Z7901 Long term (current) use of anticoagulants: Secondary | ICD-10-CM | POA: Diagnosis not present

## 2022-03-13 DIAGNOSIS — I700 Atherosclerosis of aorta: Secondary | ICD-10-CM | POA: Diagnosis not present

## 2022-03-13 DIAGNOSIS — E875 Hyperkalemia: Secondary | ICD-10-CM | POA: Diagnosis not present

## 2022-03-13 DIAGNOSIS — Z7951 Long term (current) use of inhaled steroids: Secondary | ICD-10-CM | POA: Diagnosis not present

## 2022-03-13 LAB — CULTURE, BLOOD (ROUTINE X 2)
Culture: NO GROWTH
Culture: NO GROWTH
Special Requests: ADEQUATE

## 2022-03-13 NOTE — Discharge Summary (Signed)
Physician Discharge Summary   Patient: Tammy [name] MRN: [id number] DOB: Jul 16, 1945  Admit date:     03/07/2022  Discharge date: 03/10/2022  Discharge Physician: Raiford Noble, DO   PCP: [name], DO   Recommendations at discharge:    Follow up with PCP within 1-2 weeks with CBC,CMP, Mag, Phos Follow up with Cardiology within 1-2 weeks   Discharge Diagnoses: Principal Problem:   Acute respiratory failure with hypoxia ([place]) Active Problems:   Influenza A with pneumonia   Acute metabolic encephalopathy   Coronary atherosclerosis of native coronary artery   Chronic anticoagulation   Chronic back pain   Essential hypertension   Fatty liver disease, nonalcoholic   COPD (chronic obstructive pulmonary disease) (HCC)   Hyperkalemia   Diabetes mellitus, type II (HCC)   Left breast cancer s/p mastectomy   Stage 3b chronic kidney disease ([place])  Resolved Problems:   * No resolved hospital problems. [facility] Course: The patient is an obese Caucasian female the past medical history significant for but not due to breast cancer status postmastectomy on anastrozole, history of lung cancer, history of chronic kidney disease stage IIIb, hypertension, PAD, history of PE on anticoagulation with apixaban, history of frequent urinary tract infections, history of diabetes mellitus type 2 on insulin, CAD as well as other comorbidities who was recently diagnosed with influenza A on 02/24/2022 and presented to the ED with persistent shortness of breath associate with weakness and lethargy.  On room air she was saturating 85%.  In the ED she is found to be tachycardic, tachypneic and had to be placed on supplemental oxygen via nasal cannula and blood pressure was low and dropped and had to be placed on Levophed short-term.  WBC was elevated and creatinine was slightly higher than her baseline and EKG was done that showed nonspecific ST-T wave changes.  She underwent a CT scan of the abdomen chest  abdomen pelvis which showed no acute intrathoracic, abdominal or pelvic pathology but she did also have some chronic mild right hydronephrosis with transition at the ureteropelvic junction that may just represent stricture as well as sigmoidal diverticulosis as well as a duodenal diverticulum.  She was noted to have aortic atherosclerosis as well as emphysema and a mildly dilated distal descending thoracic aorta measuring 3.8 cm in caliber.  She also had a head CT scan which was done to acute she was given IV fluids, ceftriaxone and the hospitalist with consult for admission of this patient to the hospital.      **Interim History  Patient is feeling better and improving slowly.  She states that she feels closer to baseline but wants to ambulate today.  PT OT have been consulted for further evaluation.  Still requiring oxygen but weaning off of this and is on 1 L yesterday but is more when she is improved and off of all oxygen and ambulated without issues.  Cardiology has cleared and she is medically stable for discharge    Assessment and Plan: Acute respiratory failure with hypoxia ([place]) Influenza A with pneumonia Septic Shock 2/2 to Influenza A with PNA, improved  -Patient was diagnosed with influenza A on 12/15 so suspecting bacterial superinfection given persistent symptoms now with hypoxia -Sepsis criteria include tachycardia and tachypnea with leukocytosis, AMS and had to be placed on short term Levophed which has now been discontinued -Sepsis fluids initiated  -Continue Rocephin and azithromycin -Supplemental oxygen to keep sats over 92% -Antitussives, incentive spirometry and supportive care -SpO2: 100 %  O2 Flow Rate (L/min): 1 L/min -Continuous pulse Oximetry and Maintain O2 saturations >90% -Will try low-dose Lasix 20 mg x 1 -Continue supplemental oxygen via nasal cannula wean O2 as tolerated -Will change to p.o. antibiotics and p.o. prednisone -Continue Tamiflu in outpatient  setting -Patient will need an Ambulatory home O2 screen prior to discharge as well as repeat chest x-ray in the a.m. -Repeat CXR this AM showed "Left lung base atelectasis. No focal consolidation, pleural effusion or pneumothorax. Top-normal cardiac size. Atherosclerotic calcification of the aortic arch. No acute osseous pathology." -Patient is improved and medically stable for discharge and she did not need supplemental oxygen for discharge and she ambulated without issues.   Acute Metabolic Encephalopathy, improved  -Secondary to sepsis -Was kept n.p.o. now that she is much more awake and alert will place on her diet -ABG    Component Value Date/Time   HCO3 24.7 03/08/2022 0630   ACIDBASEDEF 3.8 (H) 03/08/2022 0630   O2SAT 42.5 03/08/2022 0630   -C/w Aspiration and fall precautions -C/w Neurologic checks   Coronary atherosclerosis of native coronary artery -No complaints of chest pain and troponin negative and EKG nonacute -Continue Imdur, nitroglycerin as needed and rosuvastatin   Chronic Anticoagulation History of PE -Continue Apixaban   Elevated D-Dimer -D-Dimer was 2.08 -Likely reactive as above -Already Anticoagulated with Apixaban    AKI on Stage 3b Chronic Kidney Disease ([place]) -Renal function at baseline but slightly worsened and now improved Recent Labs  Lab 02/24/22 1912 03/07/22 1917 03/08/22 0424 03/09/22 1039 03/10/22 0930  BUN 25* 35* 40* 21 16  CREATININE 1.58* 1.84* 1.84* 1.24* 1.15*  -Baseline creatinine is around 1.5-1.7 -Avoid nephrotoxic medications, contrast dyes, hypotension and dehydration to ensure adequate renal perfusion and will need to renally adjust medications -Repeat CMP in a.m.   Left breast cancer s/p mastectomy -Continue Arimidex   Hypomagnesemia -Patients' Mag Level went from 2.0 -> 1.5 -Replete with IV Mag Sulfate 4 grams -Continue to Monitor and Trend -Repeat Mag Level in the AM   Hypophosphatemia -Patients Phos Level went  from 3.9 -> 2.0 -> 2.6 -Continue to Monitor and Replete Phos Level in the AM    Diabetes mellitus, type II (HCC) -Low-dose basal insulin -Sliding scale coverage -CBGs ranging from 72-137   Hyperkalemia -Mild elevation of potassium to 5.3 Being hydrated expecting improvement and as expected improved to 4.5   Tachycardia with SVT -D-Dimer Elevated -Already Anticoagulated -EKG showed Sinus Tachycardia and had an irregular rate -Given a bolus of 500 mL of LR -In the setting of Nebs -Continue to Monitor -Echocardiogram was done and showed "Left ventricular ejection fraction, by estimation, is 55 to 60%. The left ventricle has normal function. The left ventricle has no  regional wall motion abnormalities. The left ventricular internal cavity size was normal in size. There is no left ventricular hypertrophy. Left ventricular diastolic parameters were normal." -Given IV Metoprolol 5 mg  -Cardiology consulted and they elected to switch bisoprolol to metoprolol which is improved her heart rates and they are recommending following up in outpatient setting   COPD (chronic obstructive pulmonary disease) ([place]) -DuoNebs as needed -Will add Xopenex and Atrovent as well as guaifenesin, flutter valve and incentive spirometry   Fatty liver disease, nonalcoholic -No acute issues   Essential Hypertension -> Hypoenstion -Normotensive initially but then had attention to the point where she ended up on some low-dose Levophed but improved -Avoid further hypertensive medications -Continue monitor blood pressures per protocol and was given fluid bolus  of 500 mL yesterday but BP is now improved -BP is stable    Chronic back pain -Continue Duloxetine, as needed oxycodone.   -Will hold Neurontin and Flexeril until more alert   Hypoalbuminemia -The patient albumin level is now 2.3 x2 -> 2.4 -Continue to monitor and trend and repeat CMP in a.m.   Obesity -Complicates overall prognosis and  care -Estimated body mass index is 33.45 kg/m as calculated from the following:   Height as of this encounter: 5\' 6"  (1.676 m).   Weight as of this encounter: 94 kg.  -Weight Loss and Dietary Counseling given  Consultants: Cardiology Procedures performed: ECHO  Disposition: Home Diet recommendation:  Discharge Diet Orders (From admission, onward)     Start     Ordered   03/10/22 0000  Diet - low sodium heart healthy        03/10/22 1528   03/10/22 0000  Diet Carb Modified        03/10/22 1528           Cardiac and Carb modified diet DISCHARGE MEDICATION: Allergies as of 03/10/2022       Reactions   Hydrocodone Other (See Comments)   Unresponsive   Aleve [naproxen Sodium]    Hives & itching    Contrast Media [iodinated Contrast Media]    Pt avoids due to kidney issues   Ioxaglate Other (See Comments)   (iodine) Pt avoids due to kidney issues   Oxycodone Other (See Comments)   hallucinations   Ranexa [ranolazine]    Sick on stomach   Sulfa Antibiotics Other (See Comments)   Pt avoids due to kidney issues   Tramadol Other (See Comments)   nightmares Other reaction(s): Hallucination, Other (See Comments) nightmares        Medication List     STOP taking these medications    Jardiance 25 MG Tabs tablet Generic drug: empagliflozin   lidocaine 5 % Commonly known as: Lidoderm   moxifloxacin 0.5 % ophthalmic solution Commonly known as: VIGAMOX   nitrofurantoin (macrocrystal-monohydrate) 100 MG capsule Commonly known as: MACROBID   oseltamivir 30 MG capsule Commonly known as: Tamiflu   prednisoLONE acetate 1 % ophthalmic suspension Commonly known as: PRED FORTE   predniSONE 10 MG tablet Commonly known as: DELTASONE       TAKE these medications    acetaminophen 325 MG tablet Commonly known as: TYLENOL Take 2 tablets (650 mg total) by mouth every 6 (six) hours as needed for mild pain (or Fever >/= 101).   acyclovir 800 MG tablet Commonly  known as: ZOVIRAX Take 800 mg by mouth 4 (four) times daily.   albuterol 108 (90 Base) MCG/ACT inhaler Commonly known as: VENTOLIN HFA Inhale 2 puffs into the lungs every 6 (six) hours as needed for wheezing or shortness of breath.   albuterol (2.5 MG/3ML) 0.083% nebulizer solution Commonly known as: PROVENTIL Take 3 mLs (2.5 mg total) by nebulization every 6 (six) hours as needed for wheezing or shortness of breath.   anastrozole 1 MG tablet Commonly known as: ARIMIDEX TAKE 1 TABLET BY MOUTH DAILY   azithromycin 250 MG tablet Commonly known as: ZITHROMAX Take 2 tablets (500 mg total) by mouth daily for 3 days.   AZO BLADDER CONTROL/GO-LESS PO Take 1 tablet by mouth in the morning and at bedtime.   benzonatate 200 MG capsule Commonly known as: TESSALON Take 1 capsule (200 mg total) by mouth 2 (two) times daily as needed for cough.   Calcium 600 +  D 600-5 MG-MCG Tabs Generic drug: Calcium Carb-Cholecalciferol Take 1 tablet by mouth daily.   cholecalciferol 25 MCG (1000 UNIT) tablet Commonly known as: VITAMIN D3 Take 1,000 Units by mouth daily.   CRANBERRY PO Take 2 capsules by mouth daily.   cyclobenzaprine 5 MG tablet Commonly known as: FLEXERIL Take 1 tablet (5 mg total) by mouth 3 (three) times daily as needed for muscle spasms.   DULoxetine 60 MG capsule Commonly known as: CYMBALTA TAKE 2 CAPSULES BY MOUTH EVERY DAY   Eliquis 2.5 MG Tabs tablet Generic drug: apixaban TAKE ONE TABLET BY MOUTH TWICE DAILY   fluticasone 50 MCG/ACT nasal spray Commonly known as: FLONASE Place 1 spray into both nostrils daily.   gabapentin 400 MG capsule Commonly known as: NEURONTIN Take 400 mg by mouth 3 (three) times daily. What changed: Another medication with the same name was removed. Continue taking this medication, and follow the directions you see here.   glipiZIDE 5 MG tablet Commonly known as: GLUCOTROL TAKE 1 TABLET BY MOUTH DAILY BEFORE BREAKFAST   guaiFENesin  600 MG 12 hr tablet Commonly known as: MUCINEX Take 1 tablet (600 mg total) by mouth 2 (two) times daily for 5 days.   isosorbide mononitrate 60 MG 24 hr tablet Commonly known as: IMDUR TAKE ONE TABLET BY MOUTH AT BEDTIME   metoprolol tartrate 25 MG tablet Commonly known as: LOPRESSOR Take 1 tablet (25 mg total) by mouth 2 (two) times daily.   montelukast 10 MG tablet Commonly known as: SINGULAIR Take 1 tablet (10 mg total) by mouth at bedtime.   Myrbetriq 50 MG Tb24 tablet Generic drug: mirabegron ER TAKE 1 TABLET BY MOUTH DAILY   nitroGLYCERIN 0.4 MG SL tablet Commonly known as: NITROSTAT Place 1 tablet (0.4 mg total) under the tongue every 5 (five) minutes as needed for chest pain. Total of 3 doses   ondansetron 4 MG tablet Commonly known as: ZOFRAN Take 1 tablet (4 mg total) by mouth every 6 (six) hours as needed for nausea.   oxyCODONE-acetaminophen 5-325 MG tablet Commonly known as: PERCOCET/ROXICET TAKE 1/2 TO 1 TABLET BY MOUTH EVERY 8 HOURS AS NEEDED FOR SEVERE PAIN   pantoprazole 40 MG tablet Commonly known as: PROTONIX TAKE 1 TABLET BY MOUTH DAILY   PROBIOTIC ACIDOPHILUS PO Take 1 capsule by mouth daily at 12 noon.   rosuvastatin 40 MG tablet Commonly known as: CRESTOR Take 1 tablet (40 mg total) by mouth daily.   senna 8.6 MG Tabs tablet Commonly known as: SENOKOT Take 8.6 mg by mouth daily as needed for mild constipation.   Synthroid 150 MCG tablet Generic drug: levothyroxine TAKE ONE TABLET ON AN EMPTY STOMACH WITHA GLASS OF WATER AT LEAST 30 TO 60 MINUTES BEFORE BREAKFAST   Tresiba FlexTouch 100 UNIT/ML FlexTouch Pen Generic drug: insulin degludec Inject 40 Units into the skin daily.   trimethoprim 100 MG tablet Commonly known as: TRIMPEX TAKE ONE TABLET BY MOUTH EVERY DAY   Trulicity 4.5 ZO/1.0RU Sopn Generic drug: Dulaglutide INJECT 4.5 MG INTO THE SKIN ONCE A WEEK   UNABLE TO FIND Med Name: Energy, memory and mood enhancer   ZINC  CITRATE PO Take 1 tablet by mouth daily.       ASK your doctor about these medications    cefUROXime 500 MG tablet Commonly known as: CEFTIN Take 1 tablet (500 mg total) by mouth 2 (two) times daily with a meal for 3 doses. Ask about: Should I take this medication?  Discharge Exam: Filed Weights   03/07/22 1907  Weight: 94 kg   Vitals:   03/10/22 1300 03/10/22 1306  BP:  128/70  Pulse:  80  Resp:    Temp:  97.9 F (36.6 C)  SpO2: 92% 100%   Examination: Physical Exam:  Constitutional: WN/WD obese Caucasian female in no acute distress Respiratory: Diminished to auscultation bilaterally with coarse breath sounds, no wheezing, rales, rhonchi or crackles. Normal respiratory effort and patient is not tachypenic. No accessory muscle use.  Unlabored breathing Cardiovascular: RRR, no murmurs / rubs / gallops. S1 and S2 auscultated. No extremity edema.  Abdomen: Soft, non-tender, distended secondary to body habitus.  Bowel sounds positive.  GU: Deferred. Musculoskeletal: No clubbing / cyanosis of digits/nails. No joint deformity upper and lower extremities.   Skin: No rashes, lesions, ulcers on the skin evaluation. No induration; Warm and dry.  Neurologic: CN 2-12 grossly intact with no focal deficits.  Romberg sign and cerebellar reflexes not assessed.  Psychiatric: Normal judgment and insight. Alert and oriented x 3. Normal mood and appropriate affect.   Condition at discharge: stable  The results of significant diagnostics from this hospitalization (including imaging, microbiology, ancillary and laboratory) are listed below for reference.   Imaging Studies: ECHOCARDIOGRAM COMPLETE  Result Date: 03/10/2022    ECHOCARDIOGRAM REPORT   Patient Name:   Tammy [name] Date of Exam: 03/10/2022 Medical Rec #:  [id number]   Height:       66.0 in Accession #:    [id number]  Weight:       207.2 lb Date of Birth:  February 22, 1946    BSA:          2.030 m Patient Age:    54 years    BP:            120/64 mmHg Patient Gender: F           HR:           85 bpm. Exam Location:  [facility] Procedure: 2D Echo, Cardiac Doppler and Color Doppler Indications:     Dyspnea  History:         Patient has prior history of Echocardiogram examinations, most                  recent 09/03/2019. CAD and Angina, PAD and COPD,                  Signs/Symptoms:Dyspnea; Risk Factors:Hypertension, Diabetes,                  Dyslipidemia and Former Smoker. Left Breast and Left Lung CA,                  Flu +. Patient did not want to use Contrast.  Sonographer:     Wenda Low Referring Phys:  [id number] HQIONGEX A ARIDA Diagnosing Phys: Kathlyn Sacramento MD  Sonographer Comments: Image acquisition challenging due to COPD. IMPRESSIONS  1. Left ventricular ejection fraction, by estimation, is 55 to 60%. The left ventricle has normal function. The left ventricle has no regional wall motion abnormalities. Left ventricular diastolic parameters were normal.  2. Right ventricular systolic function is normal. The right ventricular size is normal. Tricuspid regurgitation signal is inadequate for assessing PA pressure.  3. The mitral valve is normal in structure. No evidence of mitral valve regurgitation. No evidence of mitral stenosis.  4. The aortic valve is normal in structure. Aortic valve regurgitation is not visualized. No aortic stenosis is  present.  5. The inferior vena cava is normal in size with greater than 50% respiratory variability, suggesting right atrial pressure of 3 mmHg. FINDINGS  Left Ventricle: Left ventricular ejection fraction, by estimation, is 55 to 60%. The left ventricle has normal function. The left ventricle has no regional wall motion abnormalities. The left ventricular internal cavity size was normal in size. There is  no left ventricular hypertrophy. Left ventricular diastolic parameters were normal. Right Ventricle: The right ventricular size is normal. No increase in right ventricular wall thickness. Right  ventricular systolic function is normal. Tricuspid regurgitation signal is inadequate for assessing PA pressure. Left Atrium: Left atrial size was normal in size. Right Atrium: Right atrial size was normal in size. Pericardium: There is no evidence of pericardial effusion. Mitral Valve: The mitral valve is normal in structure. No evidence of mitral valve regurgitation. No evidence of mitral valve stenosis. MV peak gradient, 3.5 mmHg. The mean mitral valve gradient is 2.0 mmHg. Tricuspid Valve: The tricuspid valve is normal in structure. Tricuspid valve regurgitation is not demonstrated. No evidence of tricuspid stenosis. Aortic Valve: The aortic valve is normal in structure. Aortic valve regurgitation is not visualized. No aortic stenosis is present. Aortic valve mean gradient measures 4.0 mmHg. Aortic valve peak gradient measures 7.6 mmHg. Aortic valve area, by VTI measures 2.28 cm. Pulmonic Valve: The pulmonic valve was normal in structure. Pulmonic valve regurgitation is trivial. No evidence of pulmonic stenosis. Aorta: The aortic root is normal in size and structure. Venous: The inferior vena cava is normal in size with greater than 50% respiratory variability, suggesting right atrial pressure of 3 mmHg. IAS/Shunts: No atrial level shunt detected by color flow Doppler.  LEFT VENTRICLE PLAX 2D LVIDd:         4.20 cm   Diastology LVIDs:         3.10 cm   LV e' medial:    7.62 cm/s LV PW:         1.00 cm   LV E/e' medial:  9.2 LV IVS:        0.90 cm   LV e' lateral:   9.79 cm/s LVOT diam:     1.90 cm   LV E/e' lateral: 7.2 LV SV:         75 LV SV Index:   37 LVOT Area:     2.84 cm  RIGHT VENTRICLE RV Basal diam:  3.70 cm RV Mid diam:    3.00 cm RV S prime:     16.20 cm/s LEFT ATRIUM             Index        RIGHT ATRIUM           Index LA diam:        3.00 cm 1.48 cm/m   RA Area:     17.20 cm LA Vol (A2C):   34.6 ml 17.04 ml/m  RA Volume:   47.50 ml  23.40 ml/m LA Vol (A4C):   32.6 ml 16.06 ml/m LA Biplane  Vol: 33.6 ml 16.55 ml/m  AORTIC VALVE                    PULMONIC VALVE AV Area (Vmax):    2.47 cm     PV Vmax:       1.01 m/s AV Area (Vmean):   2.33 cm     PV Peak grad:  4.1 mmHg AV Area (VTI):     2.28 cm  AV Vmax:           138.00 cm/s AV Vmean:          96.400 cm/s AV VTI:            0.329 m AV Peak Grad:      7.6 mmHg AV Mean Grad:      4.0 mmHg LVOT Vmax:         120.00 cm/s LVOT Vmean:        79.100 cm/s LVOT VTI:          0.264 m LVOT/AV VTI ratio: 0.80  AORTA Ao Root diam: 2.90 cm MITRAL VALVE MV Area (PHT): 3.95 cm    SHUNTS MV Area VTI:   2.83 cm    Systemic VTI:  0.26 m MV Peak grad:  3.5 mmHg    Systemic Diam: 1.90 cm MV Mean grad:  2.0 mmHg MV Vmax:       0.94 m/s MV Vmean:      61.3 cm/s MV Decel Time: 192 msec MV E velocity: 70.20 cm/s MV A velocity: 84.90 cm/s MV E/A ratio:  0.83 Kathlyn Sacramento MD Electronically signed by Kathlyn Sacramento MD Signature Date/Time: 03/10/2022/2:06:45 PM    Final    DG Chest Port 1 View  Result Date: 03/10/2022 CLINICAL DATA:  77 year old female with shortness of breath. EXAM: PORTABLE CHEST 1 VIEW COMPARISON:  Portable chest 03/09/2022 and earlier. FINDINGS: Portable AP semi upright view at 0510 hours. Stable cardiac size and mediastinal contours. Tortuous descending thoracic aorta. Thoracic spinal stimulator. Large lung volumes with centrilobular emphysema demonstrated by CT 03/08/2022. Left upper lobe curvilinear scarring redemonstrated. No pneumothorax, pulmonary edema, pleural effusion, or acute pulmonary opacity. Paucity of bowel gas in the visible abdomen. Stable visualized osseous structures. IMPRESSION: Emphysema (ICD10-J43.9) and left upper lobe scarring. No acute cardiopulmonary abnormality. Electronically Signed   By: Genevie Ann M.D.   On: 03/10/2022 05:43   DG Chest Port 1 View  Result Date: 03/09/2022 CLINICAL DATA:  Shortness of breath. EXAM: PORTABLE CHEST 1 VIEW COMPARISON:  Chest radiograph dated 03/08/2022. FINDINGS: Left lung base  atelectasis. No focal consolidation, pleural effusion or pneumothorax. Top-normal cardiac size. Atherosclerotic calcification of the aortic arch. No acute osseous pathology. IMPRESSION: No acute cardiopulmonary process. Electronically Signed   By: Anner Crete M.D.   On: 03/09/2022 01:35   DG Chest Portable 1 View  Result Date: 03/08/2022 CLINICAL DATA:  77 year old female with history of shortness of breath. EXAM: PORTABLE CHEST 1 VIEW COMPARISON:  Chest x-ray 03/07/2022. FINDINGS: Linear area of scarring in the left mid to upper lung, unchanged. No acute consolidative airspace disease. No pleural effusions. No pneumothorax. No evidence of pulmonary edema. Heart size is mildly enlarged. The patient is rotated to the right on today's exam, resulting in distortion of the mediastinal contours and reduced diagnostic sensitivity and specificity for mediastinal pathology. Atherosclerotic calcifications are noted in the thoracic aorta. Spinal cord stimulator projecting over the lower thoracic spine incidentally noted. IMPRESSION: 1. No radiographic evidence of acute cardiopulmonary disease. 2. Mild cardiomegaly. 3. Aortic atherosclerosis. Electronically Signed   By: Vinnie Langton M.D.   On: 03/08/2022 06:02   CT CHEST ABDOMEN PELVIS WO CONTRAST  Result Date: 03/08/2022 CLINICAL DATA:  Sepsis. EXAM: CT CHEST, ABDOMEN AND PELVIS WITHOUT CONTRAST TECHNIQUE: Multidetector CT imaging of the chest, abdomen and pelvis was performed following the standard protocol without IV contrast. RADIATION DOSE REDUCTION: This exam was performed according to the departmental dose-optimization program which includes automated  exposure control, adjustment of the mA and/or kV according to patient size and/or use of iterative reconstruction technique. COMPARISON:  CT abdomen pelvis dated 01/11/2022. FINDINGS: Evaluation of this exam is limited in the absence of intravenous contrast. CT CHEST FINDINGS Cardiovascular: There is no  cardiomegaly or pericardial effusion. Three-vessel coronary vascular calcification. Advanced atherosclerotic calcification of the thoracic aorta. Mildly dilated distal descending thoracic aorta measure up to 3.8 cm in caliber. The central pulmonary arteries are grossly unremarkable on this noncontrast CT. Mediastinum/Nodes: No hilar or mediastinal adenopathy. The esophagus is grossly unremarkable. No mediastinal fluid collection. Lungs/Pleura: Background of centrilobular emphysema. There are bibasilar subpleural atelectasis/scarring. Linear scarring in the left upper lobe. No consolidative changes. There is no pleural effusion or pneumothorax. The central airways are patent. Musculoskeletal: Subacute or old fractures of the left third and fourth ribs without healing at this time. No acute osseous pathology. Lower thoracic spinal stimulator. CT ABDOMEN PELVIS FINDINGS No intra-abdominal free air or free fluid. Hepatobiliary: The liver is unremarkable. No biliary dilatation. Cholecystectomy. No retained calcified stone noted in the central CBD. Pancreas: Unremarkable. No pancreatic ductal dilatation or surrounding inflammatory changes. Spleen: Normal in size without focal abnormality. Adrenals/Urinary Tract: The adrenal glands are unremarkable. Moderate right renal parenchyma atrophy. Chronic mild right hydronephrosis with transition at the ureteropelvic junction may represent a stricture. No stone identified. There is no hydronephrosis or nephrolithiasis on the left. The visualized ureters and urinary bladder appear unremarkable. Stomach/Bowel: There is sigmoid diverticulosis without active inflammatory changes. There is a 3 cm duodenal diverticulum. There is no bowel obstruction. Appendectomy. Vascular/Lymphatic: Advanced aortoiliac atherosclerotic disease. The IVC is unremarkable. No portal venous gas. There is no adenopathy. Reproductive: The uterus is grossly unremarkable. No adnexal masses. Other: None  Musculoskeletal: Osteopenia with degenerative changes of the spine. Multilevel disc spacer and posterior fusion. No acute osseous pathology. IMPRESSION: 1. No acute intrathoracic, abdominal, or pelvic pathology. 2. Chronic mild right hydronephrosis with transition at the ureteropelvic junction may represent a stricture. No stone identified. 3. Sigmoid diverticulosis as well as duodenal diverticulum. No bowel obstruction. 4. Mildly dilated distal descending thoracic aorta measure up to 3.8 cm in caliber. 5. Aortic Atherosclerosis (ICD10-I70.0) and Emphysema (ICD10-J43.9). Electronically Signed   By: Anner Crete M.D.   On: 03/08/2022 01:05   CT HEAD WO CONTRAST (5MM)  Result Date: 03/08/2022 CLINICAL DATA:  Mental status change, unknown cause EXAM: CT HEAD WITHOUT CONTRAST TECHNIQUE: Contiguous axial images were obtained from the base of the skull through the vertex without intravenous contrast. RADIATION DOSE REDUCTION: This exam was performed according to the departmental dose-optimization program which includes automated exposure control, adjustment of the mA and/or kV according to patient size and/or use of iterative reconstruction technique. COMPARISON:  11/22/2015 FINDINGS: Brain: Normal anatomic configuration. Parenchymal volume loss is commensurate with the patient's age. Mild periventricular white matter changes are present likely reflecting the sequela of small vessel ischemia. Left frontal cortical encephalomalacia is unchanged no abnormal intra or extra-axial mass lesion or fluid collection. No abnormal mass effect or midline shift. No evidence of acute intracranial hemorrhage or infarct. Ventricular size is normal. Cerebellum unremarkable. Vascular: No asymmetric hyperdense vasculature at the skull base. Skull: Intact Sinuses/Orbits: Paranasal sinuses are clear. Orbits are unremarkable. Other: Mastoid air cells and middle ear cavities are clear. IMPRESSION: 1. No acute intracranial hemorrhage or  infarct. 2. Stable left frontal cortical encephalomalacia. 3. Stable senescent change. Electronically Signed   By: Fidela Salisbury M.D.   On: 03/08/2022 00:57  DG Chest Port 1 View  Result Date: 03/07/2022 CLINICAL DATA:  Shortness of breath EXAM: PORTABLE CHEST 1 VIEW COMPARISON:  CXR 02/24/22 FINDINGS: No pleural effusion. No pneumothorax. Unchanged cardiac and mediastinal contours. Tortuous thoracic aorta. Prominent bilateral interstitial opacities in the more focal opacity in the peripheral left upper lung. Displaced rib fractures. Visualized upper abdomen is unremarkable. Spinal nerve stimulator in place. Degenerative changes of the [facility] and glenohumeral joints. IMPRESSION: Prominent bilateral interstitial opacities with a more focal opacity in the peripheral left upper lung. Findings are nonspecific, could represent findings related to atypical infection/small airways disease. Electronically Signed   By: Marin Roberts M.D.   On: 03/07/2022 19:49   DG Chest Port 1 View  Result Date: 02/24/2022 CLINICAL DATA:  Weakness EXAM: PORTABLE CHEST 1 VIEW COMPARISON:  Chest x-ray 08/23/2021 FINDINGS: The heart size and mediastinal contours are within normal limits. There is linear atelectasis in the lung bases. The lungs are otherwise clear. Clear. The visualized skeletal structures are unremarkable. Thoracic spinal cord stimulator device present. IMPRESSION: Mild bibasilar atelectasis. Electronically Signed   By: Ronney Asters M.D.   On: 02/24/2022 19:39   VAS [place] ABI WITH/WO TBI  Result Date: 02/17/2022  LOWER EXTREMITY DOPPLER STUDY Patient Name:  Tammy [name]  Date of Exam:   02/14/2022 Medical Rec #: [id number]    Accession #:    [id number] Date of Birth: 1945/04/28     Patient Gender: F Patient Age:   53 years Exam Location:  [facility] Procedure:      VAS [place] ABI WITH/WO TBI Referring Phys: Corene Cornea DEW --------------------------------------------------------------------------------  Indications:  Peripheral artery disease. High Risk Factors: Hypertension, past history of smoking, coronary artery                    disease. Other Factors: Patient complains of bilateral leg weakness.  Vascular Interventions: 07/11/10: Right superficial femoral artery PTA;                         07/16/11: Left superficial femoral artery PTA. Performing Technologist: Delorise Shiner RVT  Examination Guidelines: A complete evaluation includes at minimum, Doppler waveform signals and systolic blood pressure reading at the level of bilateral brachial, anterior tibial, and posterior tibial arteries, when vessel segments are accessible. Bilateral testing is considered an integral part of a complete examination. Photoelectric Plethysmograph (PPG) waveforms and toe systolic pressure readings are included as required and additional duplex testing as needed. Limited examinations for reoccurring indications may be performed as noted.  ABI Findings: +---------+------------------+-----+---------+--------+ Right    Rt Pressure (mmHg)IndexWaveform Comment  +---------+------------------+-----+---------+--------+ Brachial 128                                      +---------+------------------+-----+---------+--------+ PTA      109               0.85 biphasic          +---------+------------------+-----+---------+--------+ DP       106               0.83 triphasic         +---------+------------------+-----+---------+--------+ Great Toe71                0.55                   +---------+------------------+-----+---------+--------+ +---------+------------------+-----+--------+----------------------------------+ Left  Lt Pressure (mmHg)IndexWaveformComment                            +---------+------------------+-----+--------+----------------------------------+ Brachial                                Patient refused BP secondary to                                            history of breast cancer            +---------+------------------+-----+--------+----------------------------------+ PTA      108               0.84                                            +---------+------------------+-----+--------+----------------------------------+ DP       104               0.81 biphasic                                   +---------+------------------+-----+--------+----------------------------------+ Great Toe68                0.53                                            +---------+------------------+-----+--------+----------------------------------+ +-------+-----------+-----------+------------+------------+ ABI/TBIToday's ABIToday's TBIPrevious ABIPrevious TBI +-------+-----------+-----------+------------+------------+ Right  0.85       0.55       0.76        0.43         +-------+-----------+-----------+------------+------------+ Left   0.84       0.53       0.75        0.51         +-------+-----------+-----------+------------+------------+ Bilateral ABIs appear essentially unchanged compared to prior study on 10/04/2021.  Summary: Right: Resting right ankle-brachial index indicates mild right lower extremity arterial disease. The right toe-brachial index is abnormal. Left: Resting left ankle-brachial index indicates mild left lower extremity arterial disease. The left toe-brachial index is abnormal. *See table(s) above for measurements and observations.  Electronically signed by Leotis Pain MD on 02/17/2022 at 12:02:18 PM.    Final     Microbiology: Results for orders placed or performed during the hospital encounter of 03/07/22  Resp panel by RT-PCR (RSV, Flu A&B, Covid) Anterior Nasal Swab     Status: Abnormal   Collection Time: 03/08/22 12:00 AM   Specimen: Anterior Nasal Swab  Result Value Ref Range Status   SARS Coronavirus 2 by RT PCR NEGATIVE NEGATIVE Final    Comment: (NOTE) SARS-CoV-2 target nucleic acids are NOT DETECTED.  The SARS-CoV-2 RNA is generally  detectable in upper respiratory specimens during the acute phase of infection. The lowest concentration of SARS-CoV-2 viral copies this assay can detect is 138 copies/mL. A negative result does not preclude SARS-Cov-2 infection and should not be used as the sole basis for treatment or other patient management decisions. A negative result may occur  with  improper specimen collection/handling, submission of specimen other than nasopharyngeal swab, presence of viral mutation(s) within the areas targeted by this assay, and inadequate number of viral copies(<138 copies/mL). A negative result must be combined with clinical observations, patient history, and epidemiological information. The expected result is Negative.  Fact Sheet for Patients:  EntrepreneurPulse.com.au  Fact Sheet for Healthcare Providers:  IncredibleEmployment.be  This test is no t yet approved or cleared by the [place] FDA and  has been authorized for detection and/or diagnosis of SARS-CoV-2 by FDA under an Emergency Use Authorization (EUA). This EUA will remain  in effect (meaning this test can be used) for the duration of the COVID-19 declaration under Section 564(b)(1) of the Act, 21 U.S.C.section 360bbb-3(b)(1), unless the authorization is terminated  or revoked sooner.       Influenza A by PCR POSITIVE (A) NEGATIVE Final   Influenza B by PCR NEGATIVE NEGATIVE Final    Comment: (NOTE) The Xpert Xpress SARS-CoV-2/FLU/RSV plus assay is intended as an aid in the diagnosis of influenza from Nasopharyngeal swab specimens and should not be used as a sole basis for treatment. Nasal washings and aspirates are unacceptable for Xpert Xpress SARS-CoV-2/FLU/RSV testing.  Fact Sheet for Patients: EntrepreneurPulse.com.au  Fact Sheet for Healthcare Providers: IncredibleEmployment.be  This test is not yet approved or cleared by the [place]  FDA and has been authorized for detection and/or diagnosis of SARS-CoV-2 by FDA under an Emergency Use Authorization (EUA). This EUA will remain in effect (meaning this test can be used) for the duration of the COVID-19 declaration under Section 564(b)(1) of the Act, 21 U.S.C. section 360bbb-3(b)(1), unless the authorization is terminated or revoked.     Resp Syncytial Virus by PCR NEGATIVE NEGATIVE Final    Comment: (NOTE) Fact Sheet for Patients: EntrepreneurPulse.com.au  Fact Sheet for Healthcare Providers: IncredibleEmployment.be  This test is not yet approved or cleared by the [place] FDA and has been authorized for detection and/or diagnosis of SARS-CoV-2 by FDA under an Emergency Use Authorization (EUA). This EUA will remain in effect (meaning this test can be used) for the duration of the COVID-19 declaration under Section 564(b)(1) of the Act, 21 U.S.C. section 360bbb-3(b)(1), unless the authorization is terminated or revoked.  Performed at [facility], [address] [place], [place] [address]   Blood culture (routine x 2)     Status: None   Collection Time: 03/08/22  1:30 AM   Specimen: Right Antecubital; Blood  Result Value Ref Range Status   Specimen Description RIGHT ANTECUBITAL  Final   Special Requests   Final    BOTTLES DRAWN AEROBIC AND ANAEROBIC Blood Culture adequate volume   Culture   Final    NO GROWTH 5 DAYS Performed at [facility], [address] [place], [place] [address]    Report Status 03/13/2022 FINAL  Final  Blood culture (routine x 2)     Status: None   Collection Time: 03/08/22  1:35 AM   Specimen: BLOOD RIGHT HAND  Result Value Ref Range Status   Specimen Description BLOOD RIGHT HAND  Final   Special Requests   Final    BOTTLES DRAWN AEROBIC AND ANAEROBIC Blood Culture results may not be optimal due to an inadequate volume of blood received in culture bottles   Culture    Final    NO GROWTH 5 DAYS Performed at [facility], [address] [place], [place] [address]    Report Status 03/13/2022 FINAL  Final   *Note: Due to a large number of results and/or encounters for the requested time period, some results have not been displayed. A complete set of results can be found in Results Review.    Labs: CBC: Recent Labs  Lab 03/07/22 1917 03/08/22 0424 03/09/22 1039 03/10/22 0930  WBC 12.1* 10.6* 8.0 6.1  NEUTROABS  --   --  5.8 3.9  HGB 15.7* 13.9 12.4 12.2  HCT 51.5* 45.5 40.6 38.9  MCV 91.6 92.3 91.6 90.0  PLT 301 271 222 206   Basic Metabolic Panel: Recent Labs  Lab 03/06/22 2335 03/07/22 1917 03/08/22 0424 03/08/22 1045 03/09/22 1039 03/10/22 0930  NA  --  140 146*  --  141 141  K  --  5.3* 4.5  --  4.3 4.1  CL  --  109 114*  --  112* 111  CO2  --  22 22  --  22 21*  GLUCOSE  --  169* 86  --  149* 69*  BUN  --  35* 40*  --  21 16  CREATININE  --  1.84* 1.84*  --  1.24* 1.15*  CALCIUM  --  8.6* 8.3*  --  7.4* 7.3*  MG 2.1  --   --  2.0 1.5* 2.5*  PHOS  --   --   --  3.9 2.0* 2.6   Liver Function Tests: Recent Labs  Lab 03/08/22 1045 03/09/22 1039 03/10/22 0930  AST 22 27 26   ALT 15 17 14   ALKPHOS 61 67 61  BILITOT 0.4 0.4 0.4  PROT 5.7* 5.7* 5.6*  ALBUMIN 2.3* 2.3* 2.4*   CBG: Recent Labs  Lab 03/09/22 1837 03/10/22 0024 03/10/22 0250 03/10/22 0905 03/10/22 1226  GLUCAP 97 110* 86 72 137*    Discharge time spent: greater than 30 minutes.  Signed: Raiford Noble, DO [facility] 03/13/2022

## 2022-03-14 ENCOUNTER — Other Ambulatory Visit: Payer: Self-pay | Admitting: Family Medicine

## 2022-03-14 ENCOUNTER — Telehealth: Payer: Self-pay | Admitting: Family Medicine

## 2022-03-14 ENCOUNTER — Telehealth: Payer: Self-pay | Admitting: *Deleted

## 2022-03-14 NOTE — Telephone Encounter (Signed)
Collie Siad calling from [facility] is calling to report that the pt is refusing OT & PT. Pt is a fall risk. Pt did agree to nursing.   Please advise CB- [phone]

## 2022-03-14 NOTE — Patient Outreach (Signed)
  Care Coordination [facility] Note Transition Care Management Follow-up Telephone Call Date of discharge and from where: [facility] [phone] How have you been since you were released from the hospital? I am feeling much better Any questions or concerns? No  Items Reviewed: Did the pt receive and understand the discharge instructions provided? Yes  Medications obtained and verified? Yes  Patient has completed the antibiotics Other? No  Any new allergies since your discharge? No  Dietary orders reviewed? No Do you have support at home? Yes   Home Care and Equipment/Supplies: Were home health services ordered? yes If so, what is the name of the agency? [facility]  Has the agency set up a time to come to the patient's home? yes Were any new equipment or medical supplies ordered?  No What is the name of the medical supply agency? N/a Were you able to get the supplies/equipment? N/a Do you have any questions related to the use of the equipment or supplies? No  Functional Questionnaire: (I = Independent and D = Dependent) ADLs: I  Bathing/Dressing- I  Meal Prep- I  Eating- I  Maintaining continence- I  Transferring/Ambulation- I  Managing Meds- I  Follow up appointments reviewed:  PCP Hospital f/u appt confirmed? Yes  DR Park Liter [phone] 1:00 Specialist [facility] f/u appt confirmed? N Are transportation arrangements needed? No  If their condition worsens, is the pt aware to call PCP or go to the Emergency Dept.? Yes Was the patient provided with contact information for the PCP's office or ED? Yes Was to pt encouraged to call back with questions or concerns? Yes  SDOH assessments and interventions completed:   Yes  SDOH Screenings   Food Insecurity: No Food Insecurity (03/14/2022)  Housing: Low Risk  (03/14/2022)  Transportation Needs: No Transportation Needs (03/14/2022)  Utilities: Not At Risk (12/26/2021)  Alcohol Screen: Low Risk  (12/26/2021)  Depression (PHQ2-9): Low Risk   (01/03/2022)  Recent Concern: Depression (PHQ2-9) - Medium Risk (11/08/2021)  Financial Resource Strain: Low Risk  (12/26/2021)  Physical Activity: Inactive (12/26/2021)  Social Connections: Socially Integrated (12/28/2020)  Stress: No Stress Concern Present (12/26/2021)  Tobacco Use: Medium Risk (03/07/2022)     Care Coordination Interventions:  Chronic care coordinator Dellie Catholic is following patient. RN discussed foods to boost immune system.     Encounter Outcome:  Pt. Visit Completed    [place] [facility] [phone]

## 2022-03-15 NOTE — Telephone Encounter (Signed)
Requested Prescriptions  Pending Prescriptions Disp Refills   albuterol (VENTOLIN HFA) 108 (90 Base) MCG/ACT inhaler [Pharmacy Med Name: ALBUTEROL SULFATE HFA 108 (90 BASE)] 8.5 g 0    Sig: INHALE 2 PUFFS INTO THE LUNGS EVERY 6 HOURS AS NEEDED FOR WHEEZING OR SHORTNESS OF BREATH.     Pulmonology:  Beta Agonists 2 Passed - 03/14/2022  4:20 PM      Passed - Last BP in normal range    BP Readings from Last 1 Encounters:  03/10/22 128/70         Passed - Last Heart Rate in normal range    Pulse Readings from Last 1 Encounters:  03/10/22 80         Passed - Valid encounter within last 12 months    Recent Outpatient Visits           3 weeks ago [place], Megan P, DO   2 months ago Chest pain, unspecified type   [facility], Megan P, DO   3 months ago Hyperkalemia   [place], Megan P, DO   4 months ago Upper respiratory tract infection, unspecified type   [place], Megan P, DO   4 months ago Intermittent left-sided chest pain   [place], Barb Merino, DO       Future Appointments             Tomorrow [name], DO [facility]

## 2022-03-16 ENCOUNTER — Encounter: Payer: Self-pay | Admitting: Family Medicine

## 2022-03-16 ENCOUNTER — Ambulatory Visit (INDEPENDENT_AMBULATORY_CARE_PROVIDER_SITE_OTHER): Payer: Medicare Other | Admitting: Family Medicine

## 2022-03-16 VITALS — BP 123/75 | HR 65 | Temp 98.2°F

## 2022-03-16 DIAGNOSIS — I25118 Atherosclerotic heart disease of native coronary artery with other forms of angina pectoris: Secondary | ICD-10-CM

## 2022-03-16 DIAGNOSIS — I70211 Atherosclerosis of native arteries of extremities with intermittent claudication, right leg: Secondary | ICD-10-CM

## 2022-03-16 DIAGNOSIS — I129 Hypertensive chronic kidney disease with stage 1 through stage 4 chronic kidney disease, or unspecified chronic kidney disease: Secondary | ICD-10-CM

## 2022-03-16 DIAGNOSIS — R0789 Other chest pain: Secondary | ICD-10-CM

## 2022-03-16 DIAGNOSIS — E782 Mixed hyperlipidemia: Secondary | ICD-10-CM

## 2022-03-16 DIAGNOSIS — I700 Atherosclerosis of aorta: Secondary | ICD-10-CM | POA: Diagnosis not present

## 2022-03-16 DIAGNOSIS — I739 Peripheral vascular disease, unspecified: Secondary | ICD-10-CM

## 2022-03-16 DIAGNOSIS — Z794 Long term (current) use of insulin: Secondary | ICD-10-CM

## 2022-03-16 DIAGNOSIS — E1165 Type 2 diabetes mellitus with hyperglycemia: Secondary | ICD-10-CM | POA: Diagnosis not present

## 2022-03-16 DIAGNOSIS — I10 Essential (primary) hypertension: Secondary | ICD-10-CM

## 2022-03-16 DIAGNOSIS — E039 Hypothyroidism, unspecified: Secondary | ICD-10-CM | POA: Diagnosis not present

## 2022-03-16 DIAGNOSIS — J09X1 Influenza due to identified novel influenza A virus with pneumonia: Secondary | ICD-10-CM | POA: Diagnosis not present

## 2022-03-16 DIAGNOSIS — I77811 Abdominal aortic ectasia: Secondary | ICD-10-CM

## 2022-03-16 DIAGNOSIS — R0781 Pleurodynia: Secondary | ICD-10-CM | POA: Diagnosis not present

## 2022-03-16 LAB — BAYER DCA HB A1C WAIVED: HB A1C (BAYER DCA - WAIVED): 7.4 % — ABNORMAL HIGH (ref 4.8–5.6)

## 2022-03-16 MED ORDER — ALBUTEROL SULFATE HFA 108 (90 BASE) MCG/ACT IN AERS: 2.0000 | INHALATION_SPRAY | Freq: Four times a day (QID) | RESPIRATORY_TRACT | 3 refills | Status: DC | PRN

## 2022-03-16 MED ORDER — DULOXETINE HCL 60 MG PO CPEP: 120.0000 mg | ORAL_CAPSULE | Freq: Every day | ORAL | 1 refills | Status: DC

## 2022-03-16 MED ORDER — TRESIBA FLEXTOUCH 100 UNIT/ML ~~LOC~~ SOPN: 10.0000 [IU] | PEN_INJECTOR | Freq: Every day | SUBCUTANEOUS | 12 refills | Status: DC

## 2022-03-16 MED ORDER — APIXABAN 2.5 MG PO TABS: 2.5000 mg | ORAL_TABLET | Freq: Two times a day (BID) | ORAL | 1 refills | Status: DC

## 2022-03-16 MED ORDER — METOPROLOL TARTRATE 25 MG PO TABS: 25.0000 mg | ORAL_TABLET | Freq: Two times a day (BID) | ORAL | 1 refills | Status: DC

## 2022-03-16 MED ORDER — TIRZEPATIDE 10 MG/0.5ML ~~LOC~~ SOAJ: 10.0000 mg | SUBCUTANEOUS | 1 refills | Status: DC

## 2022-03-16 NOTE — Progress Notes (Signed)
BP 123/75   Pulse 65   Temp 98.2 F (36.8 C) (Oral)   SpO2 95%    Subjective:    Patient ID: Tammy [name], female    DOB: [birth year], 77 y.o.   MRN: [id number]  HPI: Tammy [name] is a 77 y.o. female  Chief Complaint  Patient presents with   Diabetes   Hypothyroidism   Hospitalization Follow-up   Transition of Tammy [name] Hospital Follow up.   Hospital/Facility: [facility] D/C Physician: Dr. Alfredia Ferguson D/C Date: 03/10/22  Records Requested: 03/16/22 Records Received: 03/16/22 Records Reviewed: 03/16/22  Diagnoses on Discharge:   Acute respiratory failure with hypoxia ([place])   Influenza A with pneumonia   Acute metabolic encephalopathy   Coronary atherosclerosis of native coronary artery   Chronic anticoagulation   Chronic back pain   Essential hypertension   Fatty liver disease, nonalcoholic   COPD (chronic obstructive pulmonary disease) (HCC)   Hyperkalemia   Diabetes mellitus, type II (HCC)   Left breast cancer s/p mastectomy   Stage 3b chronic kidney disease ([place])  Date of interactive Contact within 48 hours of discharge: 03/14/22 Contact was through: phone  Date of 7 day or 14 day face-to-face visit: 03/16/22   within 7 days  Outpatient Encounter Medications as of 03/16/2022  Medication Sig   acetaminophen (TYLENOL) 325 MG tablet Take 2 tablets (650 mg total) by mouth every 6 (six) hours as needed for mild pain (or Fever >/= 101).   acyclovir (ZOVIRAX) 800 MG tablet Take 800 mg by mouth 4 (four) times daily.   albuterol (PROVENTIL) (2.5 MG/3ML) 0.083% nebulizer solution Take 3 mLs (2.5 mg total) by nebulization every 6 (six) hours as needed for wheezing or shortness of breath.   anastrozole (ARIMIDEX) 1 MG tablet TAKE 1 TABLET BY MOUTH DAILY   benzonatate (TESSALON) 200 MG capsule Take 1 capsule (200 mg total) by mouth 2 (two) times daily as needed for cough.   Calcium Carb-Cholecalciferol (CALCIUM 600 + D) 600-200 MG-UNIT TABS Take 1 tablet by mouth daily.    cholecalciferol (VITAMIN D3)  25 MCG (1000 UT) tablet Take 1,000 Units by mouth daily.   CRANBERRY PO Take 2 capsules by mouth daily.   cyclobenzaprine (FLEXERIL) 5 MG tablet Take 1 tablet (5 mg total) by mouth 3 (three) times daily as needed for muscle spasms.   fluticasone (FLONASE) 50 MCG/ACT nasal spray Place 1 spray into both nostrils daily.   gabapentin (NEURONTIN) 400 MG capsule Take 400 mg by mouth 3 (three) times daily.   glipiZIDE (GLUCOTROL) 5 MG tablet TAKE 1 TABLET BY MOUTH DAILY BEFORE BREAKFAST   isosorbide mononitrate (IMDUR) 60 MG 24 hr tablet TAKE ONE TABLET BY MOUTH AT BEDTIME   Lactobacillus (PROBIOTIC ACIDOPHILUS PO) Take 1 capsule by mouth daily at 12 noon.   montelukast (SINGULAIR) 10 MG tablet Take 1 tablet (10 mg total) by mouth at bedtime.   MYRBETRIQ 50 MG TB24 tablet TAKE 1 TABLET BY MOUTH DAILY   nitroGLYCERIN (NITROSTAT) 0.4 MG SL tablet Place 1 tablet (0.4 mg total) under the tongue every 5 (five) minutes as needed for chest pain. Total of 3 doses   ondansetron (ZOFRAN) 4 MG tablet Take 1 tablet (4 mg total) by mouth every 6 (six) hours as needed for nausea.   oxyCODONE-acetaminophen (PERCOCET/ROXICET) 5-325 MG tablet TAKE 1/2 TO 1 TABLET BY MOUTH EVERY 8 HOURS AS NEEDED FOR SEVERE PAIN   pantoprazole (PROTONIX) 40 MG tablet TAKE 1 TABLET BY MOUTH DAILY   Pumpkin  Seed-Soy Germ (AZO BLADDER CONTROL/GO-LESS PO) Take 1 tablet by mouth in the morning and at bedtime.   rosuvastatin (CRESTOR) 40 MG tablet Take 1 tablet (40 mg total) by mouth daily.   senna (SENOKOT) 8.6 MG TABS tablet Take 8.6 mg by mouth daily as needed for mild constipation.   tirzepatide (MOUNJARO) 10 MG/0.5ML Pen Inject 10 mg into the skin once a week.   trimethoprim (TRIMPEX) 100 MG tablet TAKE ONE TABLET BY MOUTH EVERY DAY   UNABLE TO FIND Med Name: Energy, memory and mood enhancer   ZINC CITRATE PO Take 1 tablet by mouth daily.   [DISCONTINUED] albuterol (VENTOLIN HFA) 108 (90 Base) MCG/ACT inhaler INHALE 2 PUFFS INTO THE  LUNGS EVERY 6 HOURS AS NEEDED FOR WHEEZING OR SHORTNESS OF BREATH.   [DISCONTINUED] Dulaglutide (TRULICITY) 4.5 PJ/0.9TO SOPN INJECT 4.5 MG INTO THE SKIN ONCE A WEEK   [DISCONTINUED] DULoxetine (CYMBALTA) 60 MG capsule TAKE 2 CAPSULES BY MOUTH EVERY DAY   [DISCONTINUED] ELIQUIS 2.5 MG TABS tablet TAKE ONE TABLET BY MOUTH TWICE DAILY   [DISCONTINUED] insulin degludec (TRESIBA FLEXTOUCH) 100 UNIT/ML FlexTouch Pen Inject 40 Units into the skin daily.   [DISCONTINUED] metoprolol tartrate (LOPRESSOR) 25 MG tablet Take 1 tablet (25 mg total) by mouth 2 (two) times daily.   [DISCONTINUED] SYNTHROID 150 MCG tablet TAKE ONE TABLET ON AN EMPTY STOMACH WITHA GLASS OF WATER AT LEAST 30 TO 60 MINUTES BEFORE BREAKFAST   albuterol (VENTOLIN HFA) 108 (90 Base) MCG/ACT inhaler Inhale 2 puffs into the lungs every 6 (six) hours as needed for wheezing or shortness of breath.   apixaban (ELIQUIS) 2.5 MG TABS tablet Take 1 tablet (2.5 mg total) by mouth 2 (two) times daily.   DULoxetine (CYMBALTA) 60 MG capsule Take 2 capsules (120 mg total) by mouth daily.   insulin degludec (TRESIBA FLEXTOUCH) 100 UNIT/ML FlexTouch Pen Inject 10 Units into the skin daily.   metoprolol tartrate (LOPRESSOR) 25 MG tablet Take 1 tablet (25 mg total) by mouth 2 (two) times daily.   No facility-administered encounter medications on file as of 03/16/2022.  Per Hospitalist: "Hospital Course: The patient is an obese Caucasian female the past medical history significant for but not due to breast cancer status postmastectomy on anastrozole, history of lung cancer, history of chronic kidney disease stage IIIb, hypertension, PAD, history of PE on anticoagulation with apixaban, history of frequent urinary tract infections, history of diabetes mellitus type 2 on insulin, CAD as well as other comorbidities who was recently diagnosed with influenza A on 02/24/2022 and presented to the ED with persistent shortness of breath associate with weakness and  lethargy.  On room air she was saturating 85%.  In the ED she is found to be tachycardic, tachypneic and had to be placed on supplemental oxygen via nasal cannula and blood pressure was low and dropped and had to be placed on Levophed short-term.  WBC was elevated and creatinine was slightly higher than her baseline and EKG was done that showed nonspecific ST-T wave changes.  She underwent a CT scan of the abdomen chest abdomen pelvis which showed no acute intrathoracic, abdominal or pelvic pathology but she did also have some chronic mild right hydronephrosis with transition at the ureteropelvic junction that may just represent stricture as well as sigmoidal diverticulosis as well as a duodenal diverticulum.  She was noted to have aortic atherosclerosis as well as emphysema and a mildly dilated distal descending thoracic aorta measuring 3.8 cm in caliber.  She also had  a head CT scan which was done to acute she was given IV fluids, ceftriaxone and the hospitalist with consult for admission of this patient to the hospital.      Interim History  Patient is feeling better and improving slowly.  She states that she feels closer to baseline but wants to ambulate today.  PT OT have been consulted for further evaluation.  Still requiring oxygen but weaning off of this and is on 1 L yesterday but is more when she is improved and off of all oxygen and ambulated without issues.  Cardiology has cleared and she is medically stable for discharge  Assessment and Plan: Acute respiratory failure with hypoxia ([place]) Influenza A with pneumonia Septic Shock 2/2 to Influenza A with PNA, improved  -Patient was diagnosed with influenza A on 12/15 so suspecting bacterial superinfection given persistent symptoms now with hypoxia -Sepsis criteria include tachycardia and tachypnea with leukocytosis, AMS and had to be placed on short term Levophed which has now been discontinued -Sepsis fluids initiated  -Continue Rocephin and  azithromycin -Supplemental oxygen to keep sats over 92% -Antitussives, incentive spirometry and supportive care -SpO2: 100 % O2 Flow Rate (L/min): 1 L/min -Continuous pulse Oximetry and Maintain O2 saturations >90% -Will try low-dose Lasix 20 mg x 1 -Continue supplemental oxygen via nasal cannula wean O2 as tolerated -Will change to p.o. antibiotics and p.o. prednisone -Continue Tamiflu in outpatient setting -Patient will need an Ambulatory home O2 screen prior to discharge as well as repeat chest x-ray in the a.m. -Repeat CXR this AM showed "Left lung base atelectasis. No focal consolidation, pleural effusion or pneumothorax. Top-normal cardiac size. Atherosclerotic calcification of the aortic arch. No acute osseous pathology." -Patient is improved and medically stable for discharge and she did not need supplemental oxygen for discharge and she ambulated without issues.   Acute Metabolic Encephalopathy, improved  -Secondary to sepsis -Was kept n.p.o. now that she is much more awake and alert will place on her diet -ABG Labs (Brief)          Component Value Date/Time    HCO3 24.7 03/08/2022 0630    ACIDBASEDEF 3.8 (H) 03/08/2022 0630    O2SAT 42.5 03/08/2022 0630     -C/w Aspiration and fall precautions -C/w Neurologic checks   Coronary atherosclerosis of native coronary artery -No complaints of chest pain and troponin negative and EKG nonacute -Continue Imdur, nitroglycerin as needed and rosuvastatin   Chronic Anticoagulation History of PE -Continue Apixaban   Elevated D-Dimer -D-Dimer was 2.08 -Likely reactive as above -Already Anticoagulated with Apixaban    AKI on Stage 3b Chronic Kidney Disease ([place]) -Renal function at baseline but slightly worsened and now improved Last Labs         Recent Labs  Lab 02/24/22 1912 03/07/22 1917 03/08/22 0424 03/09/22 1039 03/10/22 0930  BUN 25* 35* 40* 21 16  CREATININE 1.58* 1.84* 1.84* 1.24* 1.15*    -Baseline creatinine  is around 1.5-1.7 -Avoid nephrotoxic medications, contrast dyes, hypotension and dehydration to ensure adequate renal perfusion and will need to renally adjust medications -Repeat CMP in a.m.   Left breast cancer s/p mastectomy -Continue Arimidex   Hypomagnesemia -Patients' Mag Level went from 2.0 -> 1.5 -Replete with IV Mag Sulfate 4 grams -Continue to Monitor and Trend -Repeat Mag Level in the AM   Hypophosphatemia -Patients Phos Level went from 3.9 -> 2.0 -> 2.6 -Continue to Monitor and Replete Phos Level in the AM    Diabetes mellitus, type II (  HCC) -Low-dose basal insulin -Sliding scale coverage -CBGs ranging from 72-137   Hyperkalemia -Mild elevation of potassium to 5.3 Being hydrated expecting improvement and as expected improved to 4.5   Tachycardia with SVT -D-Dimer Elevated -Already Anticoagulated -EKG showed Sinus Tachycardia and had an irregular rate -Given a bolus of 500 mL of LR -In the setting of Nebs -Continue to Monitor -Echocardiogram was done and showed "Left ventricular ejection fraction, by estimation, is 55 to 60%. The left ventricle has normal function. The left ventricle has no  regional wall motion abnormalities. The left ventricular internal cavity size was normal in size. There is no left ventricular hypertrophy. Left ventricular diastolic parameters were normal." -Given IV Metoprolol 5 mg  -Cardiology consulted and they elected to switch bisoprolol to metoprolol which is improved her heart rates and they are recommending following up in outpatient setting   COPD (chronic obstructive pulmonary disease) ([place]) -DuoNebs as needed -Will add Xopenex and Atrovent as well as guaifenesin, flutter valve and incentive spirometry   Fatty liver disease, nonalcoholic -No acute issues   Essential Hypertension -> Hypoenstion -Normotensive initially but then had attention to the point where she ended up on some low-dose Levophed but improved -Avoid further  hypertensive medications -Continue monitor blood pressures per protocol and was given fluid bolus of 500 mL yesterday but BP is now improved -BP is stable    Chronic back pain -Continue Duloxetine, as needed oxycodone.   -Will hold Neurontin and Flexeril until more alert   Hypoalbuminemia -The patient albumin level is now 2.3 x2 -> 2.4 -Continue to monitor and trend and repeat CMP in a.m.   Obesity -Complicates overall prognosis and care -Estimated body mass index is 33.45 kg/m as calculated from the following:   Height as of this encounter: 5\' 6"  (1.676 m).   Weight as of this encounter: 94 kg.  -Weight Loss and Dietary Counseling given."  Diagnostic Tests Reviewed:  CLINICAL DATA:  Shortness of breath   EXAM: PORTABLE CHEST 1 VIEW   COMPARISON:  CXR 02/24/22   FINDINGS: No pleural effusion. No pneumothorax. Unchanged cardiac and mediastinal contours. Tortuous thoracic aorta. Prominent bilateral interstitial opacities in the more focal opacity in the peripheral left upper lung. Displaced rib fractures. Visualized upper abdomen is unremarkable. Spinal nerve stimulator in place. Degenerative changes of the [facility] and glenohumeral joints.   IMPRESSION: Prominent bilateral interstitial opacities with a more focal opacity in the peripheral left upper lung. Findings are nonspecific, could represent findings related to atypical infection/small airways disease.  CLINICAL DATA:  Mental status change, unknown cause   EXAM: CT HEAD WITHOUT CONTRAST   TECHNIQUE: Contiguous axial images were obtained from the base of the skull through the vertex without intravenous contrast.   RADIATION DOSE REDUCTION: This exam was performed according to the departmental dose-optimization program which includes automated exposure control, adjustment of the mA and/or kV according to patient size and/or use of iterative reconstruction technique.   COMPARISON:  11/22/2015   FINDINGS: Brain:  Normal anatomic configuration. Parenchymal volume loss is commensurate with the patient's age. Mild periventricular white matter changes are present likely reflecting the sequela of small vessel ischemia. Left frontal cortical encephalomalacia is unchanged no abnormal intra or extra-axial mass lesion or fluid collection. No abnormal mass effect or midline shift. No evidence of acute intracranial hemorrhage or infarct. Ventricular size is normal. Cerebellum unremarkable.   Vascular: No asymmetric hyperdense vasculature at the skull base.   Skull: Intact   Sinuses/Orbits: Paranasal sinuses are clear.  Orbits are unremarkable.   Other: Mastoid air cells and middle ear cavities are clear.   IMPRESSION: 1. No acute intracranial hemorrhage or infarct. 2. Stable left frontal cortical encephalomalacia. 3. Stable senescent change.  CLINICAL DATA:  Sepsis.   EXAM: CT CHEST, ABDOMEN AND PELVIS WITHOUT CONTRAST   TECHNIQUE: Multidetector CT imaging of the chest, abdomen and pelvis was performed following the standard protocol without IV contrast.   RADIATION DOSE REDUCTION: This exam was performed according to the departmental dose-optimization program which includes automated exposure control, adjustment of the mA and/or kV according to patient size and/or use of iterative reconstruction technique.   COMPARISON:  CT abdomen pelvis dated 01/11/2022.   FINDINGS: Evaluation of this exam is limited in the absence of intravenous contrast.   CT CHEST FINDINGS   Cardiovascular: There is no cardiomegaly or pericardial effusion. Three-vessel coronary vascular calcification. Advanced atherosclerotic calcification of the thoracic aorta. Mildly dilated distal descending thoracic aorta measure up to 3.8 cm in caliber. The central pulmonary arteries are grossly unremarkable on this noncontrast CT.   Mediastinum/Nodes: No hilar or mediastinal adenopathy. The esophagus is grossly  unremarkable. No mediastinal fluid collection.   Lungs/Pleura: Background of centrilobular emphysema. There are bibasilar subpleural atelectasis/scarring. Linear scarring in the left upper lobe. No consolidative changes. There is no pleural effusion or pneumothorax. The central airways are patent.   Musculoskeletal: Subacute or old fractures of the left third and fourth ribs without healing at this time. No acute osseous pathology. Lower thoracic spinal stimulator.   CT ABDOMEN PELVIS FINDINGS   No intra-abdominal free air or free fluid.   Hepatobiliary: The liver is unremarkable. No biliary dilatation. Cholecystectomy. No retained calcified stone noted in the central CBD.   Pancreas: Unremarkable. No pancreatic ductal dilatation or surrounding inflammatory changes.   Spleen: Normal in size without focal abnormality.   Adrenals/Urinary Tract: The adrenal glands are unremarkable. Moderate right renal parenchyma atrophy. Chronic mild right hydronephrosis with transition at the ureteropelvic junction may represent a stricture. No stone identified. There is no hydronephrosis or nephrolithiasis on the left. The visualized ureters and urinary bladder appear unremarkable.   Stomach/Bowel: There is sigmoid diverticulosis without active inflammatory changes. There is a 3 cm duodenal diverticulum. There is no bowel obstruction. Appendectomy.   Vascular/Lymphatic: Advanced aortoiliac atherosclerotic disease. The IVC is unremarkable. No portal venous gas. There is no adenopathy.   Reproductive: The uterus is grossly unremarkable. No adnexal masses.   Other: None   Musculoskeletal: Osteopenia with degenerative changes of the spine. Multilevel disc spacer and posterior fusion. No acute osseous pathology.   IMPRESSION: 1. No acute intrathoracic, abdominal, or pelvic pathology. 2. Chronic mild right hydronephrosis with transition at the ureteropelvic junction may represent a  stricture. No stone identified. 3. Sigmoid diverticulosis as well as duodenal diverticulum. No bowel obstruction. 4. Mildly dilated distal descending thoracic aorta measure up to 3.8 cm in caliber. 5. Aortic Atherosclerosis (ICD10-I70.0) and Emphysema (ICD10-J43.9).  CLINICAL DATA:  77 year old female with history of shortness of breath.   EXAM: PORTABLE CHEST 1 VIEW   COMPARISON:  Chest x-ray 03/07/2022.   FINDINGS: Linear area of scarring in the left mid to upper lung, unchanged. No acute consolidative airspace disease. No pleural effusions. No pneumothorax. No evidence of pulmonary edema. Heart size is mildly enlarged. The patient is rotated to the right on today's exam, resulting in distortion of the mediastinal contours and reduced diagnostic sensitivity and specificity for mediastinal pathology. Atherosclerotic calcifications are noted in the thoracic aorta. Spinal  cord stimulator projecting over the lower thoracic spine incidentally noted.   IMPRESSION: 1. No radiographic evidence of acute cardiopulmonary disease. 2. Mild cardiomegaly. 3. Aortic atherosclerosis.  CLINICAL DATA:  Shortness of breath.   EXAM: PORTABLE CHEST 1 VIEW   COMPARISON:  Chest radiograph dated 03/08/2022.   FINDINGS: Left lung base atelectasis. No focal consolidation, pleural effusion or pneumothorax. Top-normal cardiac size. Atherosclerotic calcification of the aortic arch. No acute osseous pathology.   IMPRESSION: No acute cardiopulmonary process.  CLINICAL DATA:  77 year old female with shortness of breath.   EXAM: PORTABLE CHEST 1 VIEW   COMPARISON:  Portable chest 03/09/2022 and earlier.   FINDINGS: Portable AP semi upright view at 0510 hours. Stable cardiac size and mediastinal contours. Tortuous descending thoracic aorta. Thoracic spinal stimulator. Large lung volumes with centrilobular emphysema demonstrated by CT 03/08/2022. Left upper lobe curvilinear  scarring redemonstrated. No pneumothorax, pulmonary edema, pleural effusion, or acute pulmonary opacity. Paucity of bowel gas in the visible abdomen. Stable visualized osseous structures.   IMPRESSION: Emphysema (ICD10-J43.9) and left upper lobe scarring. No acute cardiopulmonary abnormality.  Disposition: Home with home health  Consults: None  Discharge Instructions: Follow up with PCP within 1-2 weeks with CBC,CMP, Mag, Phos Follow up with Cardiology within 1-2 weeks   Disease/illness Education: Discussed today  Home Health/Community Services Discussions/Referrals:  In place  Establishment or re-establishment of referral orders for community resources: In place  Discussion with other health care providers: N/A  Assessment and Support of treatment regimen adherence: Good  Appointments Coordinated with: Patient and husband  Education for self-management, independent living, and ADLs: Discussed today   Since getting out of the hospital, Donasia has been feeling OK. Conitnues with SOB and fatigue, but feels like they are improving. She has had several other issues as discussed below, but no more fevers or chills and she feels like her pneumonia is resolving DIABETES Hypoglycemic episodes:yes Polydipsia/polyuria: no Visual disturbance: no Chest pain: no Paresthesias: yes Glucose Monitoring: yes  Accucheck frequency: TID Taking Insulin?: yes  Long acting insulin:25 units Blood Pressure Monitoring: not checking Retinal Examination: Not up to Date Foot Exam: Up to Date Diabetic Education: Completed Pneumovax: Up to Date Influenza: Up to Date Aspirin: yes  HYPOTHYROIDISM Thyroid control status:controlled Satisfied with current treatment? yes Medication side effects: no Medication compliance: excellent compliance Recent dose adjustment:no Fatigue: yes Cold intolerance: no Heat intolerance: no Weight gain: no Weight loss: no Constipation: no Diarrhea/loose stools:  no Palpitations: no Lower extremity edema: yes Anxiety/depressed mood: yes  HYPERTENSION / HYPERLIPIDEMIA Satisfied with current treatment? yes Duration of hypertension: chronic BP monitoring frequency: not checking BP medication side effects: no Past BP meds: metoprolol Duration of hyperlipidemia: chronic Cholesterol medication side effects: no Cholesterol supplements: none Past cholesterol medications: crestor Medication compliance: excellent compliance Aspirin: no Recent stressors: no Recurrent headaches: no Visual changes: no Palpitations: no Dyspnea: no Chest pain: no Lower extremity edema: no Dizzy/lightheaded: no   Relevant past medical, surgical, family and social history reviewed and updated as indicated. Interim medical history since our last visit reviewed. Allergies and medications reviewed and updated.  Review of Systems  Constitutional: Negative.   Respiratory: Negative.    Cardiovascular: Negative.   Gastrointestinal: Negative.   Musculoskeletal:  Positive for myalgias. Negative for arthralgias, back pain, gait problem, joint swelling, neck pain and neck stiffness.  Skin: Negative.   Neurological: Negative.   Psychiatric/Behavioral: Negative.      Per HPI unless specifically indicated above     Objective:  BP 123/75   Pulse 65   Temp 98.2 F (36.8 C) (Oral)   SpO2 95%   Wt Readings from Last 3 Encounters:  03/07/22 207 lb 3.7 oz (94 kg)  02/24/22 208 lb 15.9 oz (94.8 kg)  02/16/22 209 lb (94.8 kg)    Physical Exam Vitals and nursing note reviewed.  Constitutional:      General: She is not in acute distress.    Appearance: Normal appearance. She is obese. She is not ill-appearing, toxic-appearing or diaphoretic.  HENT:     Head: Normocephalic and atraumatic.     Right Ear: External ear normal.     Left Ear: External ear normal.     Nose: Nose normal.     Mouth/Throat:     Mouth: Mucous membranes are moist.     Pharynx: Oropharynx is  clear.  Eyes:     General: No scleral icterus.       Right eye: No discharge.        Left eye: No discharge.     Extraocular Movements: Extraocular movements intact.     Conjunctiva/sclera: Conjunctivae normal.     Pupils: Pupils are equal, round, and reactive to light.  Cardiovascular:     Rate and Rhythm: Normal rate and regular rhythm.     Pulses: Normal pulses.     Heart sounds: Normal heart sounds. No murmur heard.    No friction rub. No gallop.  Pulmonary:     Effort: Pulmonary effort is normal. No respiratory distress.     Breath sounds: Normal breath sounds. No stridor. No wheezing, rhonchi or rales.  Chest:     Chest wall: No tenderness.  Musculoskeletal:        General: Normal range of motion.     Cervical back: Normal range of motion and neck supple.  Skin:    General: Skin is warm and dry.     Capillary Refill: Capillary refill takes less than 2 seconds.     Coloration: Skin is not jaundiced or pale.     Findings: No bruising, erythema, lesion or rash.  Neurological:     General: No focal deficit present.     Mental Status: She is alert and oriented to person, place, and time. Mental status is at baseline.  Psychiatric:        Mood and Affect: Mood normal.        Behavior: Behavior normal.        Thought Content: Thought content normal.        Judgment: Judgment normal.     Results for orders placed or performed in visit on 03/16/22  Bayer DCA Hb A1c Waived  Result Value Ref Range   HB A1C (BAYER DCA - WAIVED) 7.4 (H) 4.8 - 5.6 %  CBC with Differential/Platelet  Result Value Ref Range   WBC 9.3 3.4 - 10.8 x10E3/uL   RBC 4.85 3.77 - 5.28 x10E6/uL   Hemoglobin 13.7 11.1 - 15.9 g/dL   Hematocrit 42.9 34.0 - 46.6 %   MCV 89 79 - 97 fL   MCH 28.2 26.6 - 33.0 pg   MCHC 31.9 31.5 - 35.7 g/dL   RDW 14.1 11.7 - 15.4 %   Platelets 412 150 - 450 x10E3/uL   Neutrophils 54 Not Estab. %   Lymphs 26 Not Estab. %   Monocytes 12 Not Estab. %   Eos 4 Not Estab. %    Basos 1 Not Estab. %   Neutrophils Absolute 5.1 1.4 -  7.0 x10E3/uL   Lymphocytes Absolute 2.4 0.7 - 3.1 x10E3/uL   Monocytes Absolute 1.1 (H) 0.1 - 0.9 x10E3/uL   EOS (ABSOLUTE) 0.4 0.0 - 0.4 x10E3/uL   Basophils Absolute 0.1 0.0 - 0.2 x10E3/uL   Immature Granulocytes 3 Not Estab. %   Immature Grans (Abs) 0.3 (H) 0.0 - 0.1 x10E3/uL  Comprehensive metabolic panel  Result Value Ref Range   Glucose 85 70 - 99 mg/dL   BUN 20 8 - 27 mg/dL   Creatinine, Ser 1.25 (H) 0.57 - 1.00 mg/dL   eGFR 44 (L) >59 mL/min/1.73   BUN/Creatinine Ratio 16 12 - 28   Sodium 142 134 - 144 mmol/L   Potassium 4.7 3.5 - 5.2 mmol/L   Chloride 102 96 - 106 mmol/L   CO2 25 20 - 29 mmol/L   Calcium 9.2 8.7 - 10.3 mg/dL   Total Protein 6.4 6.0 - 8.5 g/dL   Albumin 3.8 3.8 - 4.8 g/dL   Globulin, Total 2.6 1.5 - 4.5 g/dL   Albumin/Globulin Ratio 1.5 1.2 - 2.2   Bilirubin Total 0.3 0.0 - 1.2 mg/dL   Alkaline Phosphatase 84 44 - 121 IU/L   AST 29 0 - 40 IU/L   ALT 22 0 - 32 IU/L  Lipid Panel w/o Chol/HDL Ratio  Result Value Ref Range   Cholesterol, Total 136 100 - 199 mg/dL   Triglycerides 175 (H) 0 - 149 mg/dL   HDL 46 >39 mg/dL   VLDL Cholesterol Cal 29 5 - 40 mg/dL   LDL Chol Calc (NIH) 61 0 - 99 mg/dL  TSH  Result Value Ref Range   TSH 0.812 0.450 - 4.500 uIU/mL   *Note: Due to a large number of results and/or encounters for the requested time period, some results have not been displayed. A complete set of results can be found in Results Review.      Assessment & Plan:   Problem List Items Addressed This Visit       Cardiovascular and Mediastinum   PAD (peripheral artery disease) ([place])    Will keep her BP, sugars and cholesterol under good control. Continue to monitor. Call with any concerns.       Relevant Medications   apixaban (ELIQUIS) 2.5 MG TABS tablet   metoprolol tartrate (LOPRESSOR) 25 MG tablet   Coronary artery disease    Will keep her BP, sugars and cholesterol under good control.  Continue to monitor. Call with any concerns.       Relevant Medications   apixaban (ELIQUIS) 2.5 MG TABS tablet   metoprolol tartrate (LOPRESSOR) 25 MG tablet   Essential hypertension    Under good control on current regimen. Continue current regimen. Continue to monitor. Call with any concerns. Refills given. Labs drawn today.        Relevant Medications   apixaban (ELIQUIS) 2.5 MG TABS tablet   metoprolol tartrate (LOPRESSOR) 25 MG tablet   Other Relevant Orders   CBC with Differential/Platelet (Completed)   Comprehensive metabolic panel (Completed)   Microalbumin, Urine Waived   Abdominal aortic atherosclerosis ([place])    Will keep her BP, sugars and cholesterol under good control. Continue to monitor. Call with any concerns.       Relevant Medications   apixaban (ELIQUIS) 2.5 MG TABS tablet   metoprolol tartrate (LOPRESSOR) 25 MG tablet   Abdominal aortic ectasia (HCC)    Will keep her BP, sugars and cholesterol under good control. Continue to monitor. Call with any concerns.  Relevant Medications   apixaban (ELIQUIS) 2.5 MG TABS tablet   metoprolol tartrate (LOPRESSOR) 25 MG tablet   Atherosclerosis of native arteries of extremity with intermittent claudication ([place])    Will keep her BP, sugars and cholesterol under good control. Continue to monitor. Call with any concerns.       Relevant Medications   DULoxetine (CYMBALTA) 60 MG capsule   apixaban (ELIQUIS) 2.5 MG TABS tablet   metoprolol tartrate (LOPRESSOR) 25 MG tablet   Other Relevant Orders   CBC with Differential/Platelet (Completed)   Comprehensive metabolic panel (Completed)     Endocrine   Uncontrolled type 2 diabetes mellitus with hyperglycemia, with long-term current use of insulin (HCC)    Sugars have been dropping. We will change her from trulicity to [facility] and cut her insulin to 10units. Goal of raising and stopping her insulin. Continue to monitor. Call with any concerns.       Relevant  Medications   tirzepatide (MOUNJARO) 10 MG/0.5ML Pen   insulin degludec (TRESIBA FLEXTOUCH) 100 UNIT/ML FlexTouch Pen   Other Relevant Orders   Bayer DCA Hb A1c Waived (Completed)   CBC with Differential/Platelet (Completed)   Comprehensive metabolic panel (Completed)   Urinalysis, Routine w reflex microscopic   Hypothyroid    Rechecking labs today. Await results. Treat as needed.       Relevant Medications   metoprolol tartrate (LOPRESSOR) 25 MG tablet   Other Relevant Orders   CBC with Differential/Platelet (Completed)   Comprehensive metabolic panel (Completed)   TSH (Completed)     Genitourinary   Benign hypertensive renal disease    Under good control on current regimen. Continue current regimen. Continue to monitor. Call with any concerns. Refills given. Labs drawn today.       Relevant Orders   CBC with Differential/Platelet (Completed)   Comprehensive metabolic panel (Completed)     Other   Hyperlipidemia    Under good control on current regimen. Continue current regimen. Continue to monitor. Call with any concerns. Refills given. Labs drawn today.       Relevant Medications   apixaban (ELIQUIS) 2.5 MG TABS tablet   metoprolol tartrate (LOPRESSOR) 25 MG tablet   Other Relevant Orders   CBC with Differential/Platelet (Completed)   Comprehensive metabolic panel (Completed)   Lipid Panel w/o Chol/HDL Ratio (Completed)   Intermittent left-sided chest pain    Acting up again. Advised her to follow up with the physician who treated this previously- we will check x-rays and continue to monitor.       Relevant Medications   DULoxetine (CYMBALTA) 60 MG capsule   Other Visit Diagnoses     Influenza A with pneumonia    -  Primary   Resolving. Will check CXR and continue to monitor. Call with any concerns.   Relevant Medications   albuterol (VENTOLIN HFA) 108 (90 Base) MCG/ACT inhaler   Rib pain on left side       Acting up again. Advised her to follow up with the  physician who treated this previously- we will check x-rays and continue to monitor.   Relevant Orders   DG Ribs Unilateral Left        Follow up plan: Return in about 2 weeks (around 03/30/2022) for virtual please.  >45 minutes spent with patient and husband today

## 2022-03-16 NOTE — Telephone Encounter (Signed)
Noted. Seeing her today

## 2022-03-17 ENCOUNTER — Other Ambulatory Visit: Payer: Self-pay | Admitting: Family Medicine

## 2022-03-17 LAB — CBC WITH DIFFERENTIAL/PLATELET
Basophils Absolute: 0.1 10*3/uL (ref 0.0–0.2)
Basos: 1 %
EOS (ABSOLUTE): 0.4 10*3/uL (ref 0.0–0.4)
Eos: 4 %
Hematocrit: 42.9 % (ref 34.0–46.6)
Hemoglobin: 13.7 g/dL (ref 11.1–15.9)
Immature Grans (Abs): 0.3 10*3/uL — ABNORMAL HIGH (ref 0.0–0.1)
Immature Granulocytes: 3 %
Lymphocytes Absolute: 2.4 10*3/uL (ref 0.7–3.1)
Lymphs: 26 %
MCH: 28.2 pg (ref 26.6–33.0)
MCHC: 31.9 g/dL (ref 31.5–35.7)
MCV: 89 fL (ref 79–97)
Monocytes Absolute: 1.1 10*3/uL — ABNORMAL HIGH (ref 0.1–0.9)
Monocytes: 12 %
Neutrophils Absolute: 5.1 10*3/uL (ref 1.4–7.0)
Neutrophils: 54 %
Platelets: 412 10*3/uL (ref 150–450)
RBC: 4.85 x10E6/uL (ref 3.77–5.28)
RDW: 14.1 % (ref 11.7–15.4)
WBC: 9.3 10*3/uL (ref 3.4–10.8)

## 2022-03-17 LAB — COMPREHENSIVE METABOLIC PANEL
ALT: 22 IU/L (ref 0–32)
AST: 29 IU/L (ref 0–40)
Albumin/Globulin Ratio: 1.5 (ref 1.2–2.2)
Albumin: 3.8 g/dL (ref 3.8–4.8)
Alkaline Phosphatase: 84 IU/L (ref 44–121)
BUN/Creatinine Ratio: 16 (ref 12–28)
BUN: 20 mg/dL (ref 8–27)
Bilirubin Total: 0.3 mg/dL (ref 0.0–1.2)
CO2: 25 mmol/L (ref 20–29)
Calcium: 9.2 mg/dL (ref 8.7–10.3)
Chloride: 102 mmol/L (ref 96–106)
Creatinine, Ser: 1.25 mg/dL — ABNORMAL HIGH (ref 0.57–1.00)
Globulin, Total: 2.6 g/dL (ref 1.5–4.5)
Glucose: 85 mg/dL (ref 70–99)
Potassium: 4.7 mmol/L (ref 3.5–5.2)
Sodium: 142 mmol/L (ref 134–144)
Total Protein: 6.4 g/dL (ref 6.0–8.5)
eGFR: 44 mL/min/{1.73_m2} — ABNORMAL LOW (ref >59)

## 2022-03-17 LAB — LIPID PANEL W/O CHOL/HDL RATIO
Cholesterol, Total: 136 mg/dL (ref 100–199)
HDL: 46 mg/dL (ref >39)
LDL Chol Calc (NIH): 61 mg/dL (ref 0–99)
Triglycerides: 175 mg/dL — ABNORMAL HIGH (ref 0–149)
VLDL Cholesterol Cal: 29 mg/dL (ref 5–40)

## 2022-03-17 LAB — TSH: TSH: 0.812 u[IU]/mL (ref 0.450–4.500)

## 2022-03-17 MED ORDER — SYNTHROID 150 MCG PO TABS: 150.0000 ug | ORAL_TABLET | Freq: Every day | ORAL | 3 refills | Status: DC

## 2022-03-19 ENCOUNTER — Encounter: Payer: Self-pay | Admitting: Family Medicine

## 2022-03-19 NOTE — Assessment & Plan Note (Signed)
Acting up again. Advised her to follow up with the physician who treated this previously- we will check x-rays and continue to monitor.

## 2022-03-19 NOTE — Assessment & Plan Note (Signed)
Will keep her BP, sugars and cholesterol under good control. Continue to monitor. Call with any concerns.

## 2022-03-19 NOTE — Assessment & Plan Note (Signed)
Under good control on current regimen. Continue current regimen. Continue to monitor. Call with any concerns. Refills given. Labs drawn today.   

## 2022-03-19 NOTE — Assessment & Plan Note (Signed)
Sugars have been dropping. We will change her from trulicity to [facility] and cut her insulin to 10units. Goal of raising and stopping her insulin. Continue to monitor. Call with any concerns.

## 2022-03-19 NOTE — Assessment & Plan Note (Signed)
Rechecking labs today. Await results. Treat as needed.  °

## 2022-03-24 DIAGNOSIS — J9601 Acute respiratory failure with hypoxia: Secondary | ICD-10-CM | POA: Diagnosis not present

## 2022-03-24 DIAGNOSIS — J09X1 Influenza due to identified novel influenza A virus with pneumonia: Secondary | ICD-10-CM | POA: Diagnosis not present

## 2022-03-24 DIAGNOSIS — G9341 Metabolic encephalopathy: Secondary | ICD-10-CM | POA: Diagnosis not present

## 2022-03-24 DIAGNOSIS — J439 Emphysema, unspecified: Secondary | ICD-10-CM | POA: Diagnosis not present

## 2022-03-24 DIAGNOSIS — E1151 Type 2 diabetes mellitus with diabetic peripheral angiopathy without gangrene: Secondary | ICD-10-CM | POA: Diagnosis not present

## 2022-03-24 DIAGNOSIS — E1122 Type 2 diabetes mellitus with diabetic chronic kidney disease: Secondary | ICD-10-CM | POA: Diagnosis not present

## 2022-03-27 ENCOUNTER — Other Ambulatory Visit: Payer: Self-pay | Admitting: Family Medicine

## 2022-03-27 DIAGNOSIS — E1122 Type 2 diabetes mellitus with diabetic chronic kidney disease: Secondary | ICD-10-CM | POA: Diagnosis not present

## 2022-03-27 DIAGNOSIS — E1151 Type 2 diabetes mellitus with diabetic peripheral angiopathy without gangrene: Secondary | ICD-10-CM | POA: Diagnosis not present

## 2022-03-27 DIAGNOSIS — J9601 Acute respiratory failure with hypoxia: Secondary | ICD-10-CM | POA: Diagnosis not present

## 2022-03-27 DIAGNOSIS — J439 Emphysema, unspecified: Secondary | ICD-10-CM | POA: Diagnosis not present

## 2022-03-27 DIAGNOSIS — G9341 Metabolic encephalopathy: Secondary | ICD-10-CM | POA: Diagnosis not present

## 2022-03-27 DIAGNOSIS — J09X1 Influenza due to identified novel influenza A virus with pneumonia: Secondary | ICD-10-CM | POA: Diagnosis not present

## 2022-03-27 NOTE — Telephone Encounter (Signed)
Medication discontinued on 06/09/21, currently on a different dosage, will refuse this request.  Requested Prescriptions  Pending Prescriptions Disp Refills   metoprolol succinate (TOPROL-XL) 50 MG 24 hr tablet [Pharmacy Med Name: METOPROLOL SUCCINATE ER 50 MG TAB] 90 tablet 1    Sig: TAKE ONE TABLET BY MOUTH EVERY DAY WITH OR IMMEDIATELY FOLLOWING A MEAL     Cardiovascular:  Beta Blockers Passed - 03/27/2022  9:50 AM      Passed - Last BP in normal range    BP Readings from Last 1 Encounters:  03/16/22 123/75         Passed - Last Heart Rate in normal range    Pulse Readings from Last 1 Encounters:  03/16/22 65         Passed - Valid encounter within last 6 months    Recent Outpatient Visits           1 week ago Influenza A with pneumonia   [facility] [place], Megan P, DO   1 month ago Wheezing   [facility] [place], [place], DO   2 months ago Chest pain, unspecified type   [facility] [place], Megan P, DO   3 months ago Hyperkalemia   [facility] [place], Megan P, DO   4 months ago Upper respiratory tract infection, unspecified type   [facility] [place], Megan P, DO       Future Appointments             In 1 week Johnson, Oralia Rud, DO [facility]   In 1 month Gollan, [name], MD [facility]

## 2022-04-03 ENCOUNTER — Encounter: Payer: Self-pay | Admitting: Family Medicine

## 2022-04-03 ENCOUNTER — Telehealth (INDEPENDENT_AMBULATORY_CARE_PROVIDER_SITE_OTHER): Payer: Medicare Other | Admitting: Family Medicine

## 2022-04-03 VITALS — BP 127/70

## 2022-04-03 DIAGNOSIS — E1151 Type 2 diabetes mellitus with diabetic peripheral angiopathy without gangrene: Secondary | ICD-10-CM | POA: Diagnosis not present

## 2022-04-03 DIAGNOSIS — G9341 Metabolic encephalopathy: Secondary | ICD-10-CM | POA: Diagnosis not present

## 2022-04-03 DIAGNOSIS — Z7985 Long-term (current) use of injectable non-insulin antidiabetic drugs: Secondary | ICD-10-CM

## 2022-04-03 DIAGNOSIS — E1122 Type 2 diabetes mellitus with diabetic chronic kidney disease: Secondary | ICD-10-CM | POA: Diagnosis not present

## 2022-04-03 DIAGNOSIS — E1165 Type 2 diabetes mellitus with hyperglycemia: Secondary | ICD-10-CM

## 2022-04-03 DIAGNOSIS — J09X1 Influenza due to identified novel influenza A virus with pneumonia: Secondary | ICD-10-CM | POA: Diagnosis not present

## 2022-04-03 DIAGNOSIS — J439 Emphysema, unspecified: Secondary | ICD-10-CM | POA: Diagnosis not present

## 2022-04-03 DIAGNOSIS — I70211 Atherosclerosis of native arteries of extremities with intermittent claudication, right leg: Secondary | ICD-10-CM | POA: Diagnosis not present

## 2022-04-03 DIAGNOSIS — J9601 Acute respiratory failure with hypoxia: Secondary | ICD-10-CM | POA: Diagnosis not present

## 2022-04-03 NOTE — Progress Notes (Signed)
BP 127/70   SpO2 96%    Subjective:    Patient ID: Tammy [name], female    DOB: 20-Jun-1945, 77 y.o.   MRN: [id number]  HPI: Tammy [name] is a 77 y.o. female  Chief Complaint  Patient presents with   Diabetes   DIABETES- has not started her mounjaro yet, has 1 pen left on her trulicity Hypoglycemic episodes:no Polydipsia/polyuria: no Visual disturbance: no Chest pain: no Paresthesias: no Glucose Monitoring: yes  Accucheck frequency: continuous  Fasting glucose: 89 Taking Insulin?: yes  Long acting insulin: 10 units Blood Pressure Monitoring: not checking Retinal Examination: Not up to Date Foot Exam: Up to Date Diabetic Education: Completed Pneumovax: Up to Date Influenza: Up to Date Aspirin: no   Relevant past medical, surgical, family and social history reviewed and updated as indicated. Interim medical history since our last visit reviewed. Allergies and medications reviewed and updated.  Review of Systems  Constitutional: Negative.   Respiratory: Negative.    Cardiovascular: Negative.   Gastrointestinal: Negative.   Musculoskeletal: Negative.   Neurological: Negative.   Psychiatric/Behavioral: Negative.      Per HPI unless specifically indicated above     Objective:    BP 127/70   SpO2 96%   Wt Readings from Last 3 Encounters:  03/07/22 207 lb 3.7 oz (94 kg)  02/24/22 208 lb 15.9 oz (94.8 kg)  02/16/22 209 lb (94.8 kg)    Physical Exam Vitals and nursing note reviewed.  Constitutional:      General: She is not in acute distress.    Appearance: Normal appearance. She is obese. She is not ill-appearing, toxic-appearing or diaphoretic.  HENT:     Head: Normocephalic and atraumatic.     Right Ear: External ear normal.     Left Ear: External ear normal.     [name]: [name] normal.     Mouth/Throat:     Mouth: Mucous membranes are moist.     Pharynx: Oropharynx is clear.  Eyes:     General: No scleral icterus.       Right eye: No discharge.         Left eye: No discharge.     Extraocular Movements: Extraocular movements intact.     Conjunctiva/sclera: Conjunctivae normal.     Pupils: Pupils are equal, round, and reactive to light.  Cardiovascular:     Rate and Rhythm: Normal rate and regular rhythm.     Pulses: Normal pulses.     Heart sounds: Normal heart sounds. No murmur heard.    No friction rub. No gallop.  Pulmonary:     Effort: Pulmonary effort is normal. No respiratory distress.     Breath sounds: Normal breath sounds. No stridor. No wheezing, rhonchi or rales.  Chest:     Chest wall: No tenderness.  Musculoskeletal:        General: Normal range of motion.     Cervical back: Normal range of motion and neck supple.  Skin:    General: Skin is warm and dry.     Capillary Refill: Capillary refill takes less than 2 seconds.     Coloration: Skin is not jaundiced or pale.     Findings: No bruising, erythema, lesion or rash.  Neurological:     General: No focal deficit present.     Mental Status: She is alert and oriented to person, place, and time. Mental status is at baseline.  Psychiatric:        Mood and Affect:  Mood normal.        Behavior: Behavior normal.        Thought Content: Thought content normal.        Judgment: Judgment normal.     Results for orders placed or performed in visit on 03/16/22  Bayer DCA Hb A1c Waived  Result Value Ref Range   HB A1C (BAYER DCA - WAIVED) 7.4 (H) 4.8 - 5.6 %  CBC with Differential/Platelet  Result Value Ref Range   WBC 9.3 3.4 - 10.8 x10E3/uL   RBC 4.85 3.77 - 5.28 x10E6/uL   Hemoglobin 13.7 11.1 - 15.9 g/dL   Hematocrit 52.5 06.0 - 46.6 %   MCV 89 79 - 97 fL   MCH 28.2 26.6 - 33.0 pg   MCHC 31.9 31.5 - 35.7 g/dL   RDW 49.3 31.9 - 91.9 %   Platelets 412 150 - 450 x10E3/uL   Neutrophils 54 Not Estab. %   Lymphs 26 Not Estab. %   Monocytes 12 Not Estab. %   Eos 4 Not Estab. %   Basos 1 Not Estab. %   Neutrophils Absolute 5.1 1.4 - 7.0 x10E3/uL   Lymphocytes  Absolute 2.4 0.7 - 3.1 x10E3/uL   Monocytes Absolute 1.1 (H) 0.1 - 0.9 x10E3/uL   EOS (ABSOLUTE) 0.4 0.0 - 0.4 x10E3/uL   Basophils Absolute 0.1 0.0 - 0.2 x10E3/uL   Immature Granulocytes 3 Not Estab. %   Immature Grans (Abs) 0.3 (H) 0.0 - 0.1 x10E3/uL  Comprehensive metabolic panel  Result Value Ref Range   Glucose 85 70 - 99 mg/dL   BUN 20 8 - 27 mg/dL   Creatinine, Ser 0.60 (H) 0.57 - 1.00 mg/dL   eGFR 44 (L) >77 FJ/OKJ/7.30   BUN/Creatinine Ratio 16 12 - 28   Sodium 142 134 - 144 mmol/L   Potassium 4.7 3.5 - 5.2 mmol/L   Chloride 102 96 - 106 mmol/L   CO2 25 20 - 29 mmol/L   Calcium 9.2 8.7 - 10.3 mg/dL   Total Protein 6.4 6.0 - 8.5 g/dL   Albumin 3.8 3.8 - 4.8 g/dL   Globulin, Total 2.6 1.5 - 4.5 g/dL   Albumin/Globulin Ratio 1.5 1.2 - 2.2   Bilirubin Total 0.3 0.0 - 1.2 mg/dL   Alkaline Phosphatase 84 44 - 121 IU/L   AST 29 0 - 40 IU/L   ALT 22 0 - 32 IU/L  Lipid Panel w/o Chol/HDL Ratio  Result Value Ref Range   Cholesterol, Total 136 100 - 199 mg/dL   Triglycerides 288 (H) 0 - 149 mg/dL   HDL 46 >55 mg/dL   VLDL Cholesterol Cal 29 5 - 40 mg/dL   LDL Chol Calc (NIH) 61 0 - 99 mg/dL  TSH  Result Value Ref Range   TSH 0.812 0.450 - 4.500 uIU/mL   *Note: Due to a large number of results and/or encounters for the requested time period, some results have not been displayed. A complete set of results can be found in Results Review.      Assessment & Plan:   Problem List Items Addressed This Visit       Endocrine   Uncontrolled type 2 diabetes mellitus with hyperglycemia, with long-term current use of insulin (HCC) - Primary    Has not started mounjaro yet. Sugars running well. Will finish 1 more week of trulicity then change to mounjaro. Recheck 3 weeks virtually. Goal of stopping tresiba. Call with any concerns.  Follow up plan: Return in about 3 weeks (around 04/24/2022) for virtual OK.   This visit was completed via video visit through MyChart due to  the restrictions of the COVID-19 pandemic. All issues as above were discussed and addressed. Physical exam was done as above through visual confirmation on video through MyChart. If it was felt that the patient should be evaluated in the office, they were directed there. The patient verbally consented to this visit. Location of the patient: home Location of the provider: work Those involved with this call:  Provider: Olevia Perches, DO CMA: Malen Gauze, CMA Front Desk/Registration: [employer]  Time spent on call:  15 minutes with patient face to face via video conference. More than 50% of this time was spent in counseling and coordination of care. 23 minutes total spent in review of patient's record and preparation of their chart.

## 2022-04-03 NOTE — Assessment & Plan Note (Signed)
Has not started mounjaro yet. Sugars running well. Will finish 1 more week of trulicity then change to mounjaro. Recheck 3 weeks virtually. Goal of stopping tresiba. Call with any concerns.

## 2022-04-10 DIAGNOSIS — E1151 Type 2 diabetes mellitus with diabetic peripheral angiopathy without gangrene: Secondary | ICD-10-CM | POA: Diagnosis not present

## 2022-04-10 DIAGNOSIS — E1122 Type 2 diabetes mellitus with diabetic chronic kidney disease: Secondary | ICD-10-CM | POA: Diagnosis not present

## 2022-04-10 DIAGNOSIS — J09X1 Influenza due to identified novel influenza A virus with pneumonia: Secondary | ICD-10-CM | POA: Diagnosis not present

## 2022-04-10 DIAGNOSIS — G9341 Metabolic encephalopathy: Secondary | ICD-10-CM | POA: Diagnosis not present

## 2022-04-10 DIAGNOSIS — J9601 Acute respiratory failure with hypoxia: Secondary | ICD-10-CM | POA: Diagnosis not present

## 2022-04-10 DIAGNOSIS — J439 Emphysema, unspecified: Secondary | ICD-10-CM | POA: Diagnosis not present

## 2022-04-11 DIAGNOSIS — N3020 Other chronic cystitis without hematuria: Secondary | ICD-10-CM | POA: Diagnosis not present

## 2022-04-11 DIAGNOSIS — N261 Atrophy of kidney (terminal): Secondary | ICD-10-CM | POA: Diagnosis not present

## 2022-04-11 DIAGNOSIS — N184 Chronic kidney disease, stage 4 (severe): Secondary | ICD-10-CM | POA: Diagnosis not present

## 2022-04-11 DIAGNOSIS — M533 Sacrococcygeal disorders, not elsewhere classified: Secondary | ICD-10-CM | POA: Diagnosis not present

## 2022-04-11 DIAGNOSIS — K5730 Diverticulosis of large intestine without perforation or abscess without bleeding: Secondary | ICD-10-CM | POA: Diagnosis not present

## 2022-04-11 DIAGNOSIS — N1330 Unspecified hydronephrosis: Secondary | ICD-10-CM | POA: Diagnosis not present

## 2022-04-11 DIAGNOSIS — R1084 Generalized abdominal pain: Secondary | ICD-10-CM | POA: Diagnosis not present

## 2022-04-12 DIAGNOSIS — E1151 Type 2 diabetes mellitus with diabetic peripheral angiopathy without gangrene: Secondary | ICD-10-CM | POA: Diagnosis not present

## 2022-04-12 DIAGNOSIS — I2510 Atherosclerotic heart disease of native coronary artery without angina pectoris: Secondary | ICD-10-CM | POA: Diagnosis not present

## 2022-04-12 DIAGNOSIS — I129 Hypertensive chronic kidney disease with stage 1 through stage 4 chronic kidney disease, or unspecified chronic kidney disease: Secondary | ICD-10-CM | POA: Diagnosis not present

## 2022-04-12 DIAGNOSIS — Z794 Long term (current) use of insulin: Secondary | ICD-10-CM | POA: Diagnosis not present

## 2022-04-12 DIAGNOSIS — Z7901 Long term (current) use of anticoagulants: Secondary | ICD-10-CM | POA: Diagnosis not present

## 2022-04-12 DIAGNOSIS — E669 Obesity, unspecified: Secondary | ICD-10-CM | POA: Diagnosis not present

## 2022-04-12 DIAGNOSIS — Z79891 Long term (current) use of opiate analgesic: Secondary | ICD-10-CM | POA: Diagnosis not present

## 2022-04-12 DIAGNOSIS — K760 Fatty (change of) liver, not elsewhere classified: Secondary | ICD-10-CM | POA: Diagnosis not present

## 2022-04-12 DIAGNOSIS — Z86711 Personal history of pulmonary embolism: Secondary | ICD-10-CM | POA: Diagnosis not present

## 2022-04-12 DIAGNOSIS — J439 Emphysema, unspecified: Secondary | ICD-10-CM | POA: Diagnosis not present

## 2022-04-12 DIAGNOSIS — G8929 Other chronic pain: Secondary | ICD-10-CM | POA: Diagnosis not present

## 2022-04-12 DIAGNOSIS — Z7951 Long term (current) use of inhaled steroids: Secondary | ICD-10-CM | POA: Diagnosis not present

## 2022-04-12 DIAGNOSIS — N1832 Chronic kidney disease, stage 3b: Secondary | ICD-10-CM | POA: Diagnosis not present

## 2022-04-12 DIAGNOSIS — E875 Hyperkalemia: Secondary | ICD-10-CM | POA: Diagnosis not present

## 2022-04-12 DIAGNOSIS — J09X1 Influenza due to identified novel influenza A virus with pneumonia: Secondary | ICD-10-CM | POA: Diagnosis not present

## 2022-04-12 DIAGNOSIS — K5790 Diverticulosis of intestine, part unspecified, without perforation or abscess without bleeding: Secondary | ICD-10-CM | POA: Diagnosis not present

## 2022-04-12 DIAGNOSIS — E8809 Other disorders of plasma-protein metabolism, not elsewhere classified: Secondary | ICD-10-CM | POA: Diagnosis not present

## 2022-04-12 DIAGNOSIS — J9601 Acute respiratory failure with hypoxia: Secondary | ICD-10-CM | POA: Diagnosis not present

## 2022-04-12 DIAGNOSIS — G9341 Metabolic encephalopathy: Secondary | ICD-10-CM | POA: Diagnosis not present

## 2022-04-12 DIAGNOSIS — I700 Atherosclerosis of aorta: Secondary | ICD-10-CM | POA: Diagnosis not present

## 2022-04-12 DIAGNOSIS — C50919 Malignant neoplasm of unspecified site of unspecified female breast: Secondary | ICD-10-CM | POA: Diagnosis not present

## 2022-04-12 DIAGNOSIS — E1122 Type 2 diabetes mellitus with diabetic chronic kidney disease: Secondary | ICD-10-CM | POA: Diagnosis not present

## 2022-04-12 DIAGNOSIS — Z6833 Body mass index (BMI) 33.0-33.9, adult: Secondary | ICD-10-CM | POA: Diagnosis not present

## 2022-04-24 ENCOUNTER — Ambulatory Visit (INDEPENDENT_AMBULATORY_CARE_PROVIDER_SITE_OTHER): Payer: Medicare Other | Admitting: Surgery

## 2022-04-24 ENCOUNTER — Encounter: Payer: Self-pay | Admitting: Surgery

## 2022-04-24 VITALS — BP 132/50 | HR 47 | Temp 98.7°F | Ht 66.0 in

## 2022-04-24 DIAGNOSIS — S2242XG Multiple fractures of ribs, left side, subsequent encounter for fracture with delayed healing: Secondary | ICD-10-CM

## 2022-04-24 DIAGNOSIS — Z9012 Acquired absence of left breast and nipple: Secondary | ICD-10-CM

## 2022-04-24 DIAGNOSIS — N644 Mastodynia: Secondary | ICD-10-CM | POA: Diagnosis not present

## 2022-04-24 DIAGNOSIS — J09X1 Influenza due to identified novel influenza A virus with pneumonia: Secondary | ICD-10-CM | POA: Diagnosis not present

## 2022-04-24 DIAGNOSIS — E1122 Type 2 diabetes mellitus with diabetic chronic kidney disease: Secondary | ICD-10-CM | POA: Diagnosis not present

## 2022-04-24 DIAGNOSIS — I70211 Atherosclerosis of native arteries of extremities with intermittent claudication, right leg: Secondary | ICD-10-CM

## 2022-04-24 DIAGNOSIS — J439 Emphysema, unspecified: Secondary | ICD-10-CM | POA: Diagnosis not present

## 2022-04-24 DIAGNOSIS — E1151 Type 2 diabetes mellitus with diabetic peripheral angiopathy without gangrene: Secondary | ICD-10-CM | POA: Diagnosis not present

## 2022-04-24 DIAGNOSIS — R0789 Other chest pain: Secondary | ICD-10-CM | POA: Diagnosis not present

## 2022-04-24 DIAGNOSIS — J9601 Acute respiratory failure with hypoxia: Secondary | ICD-10-CM | POA: Diagnosis not present

## 2022-04-24 DIAGNOSIS — G9341 Metabolic encephalopathy: Secondary | ICD-10-CM | POA: Diagnosis not present

## 2022-04-24 NOTE — Progress Notes (Signed)
Medical Records request has been faxed to [facility] for MRI and report to be mailed on a disk.

## 2022-04-24 NOTE — Progress Notes (Signed)
Outpatient Surgical Follow Up  04/24/2022  Tammy [name] is an 77 y.o. female.   Chief Complaint  Patient presents with   Follow-up    Left breast mastectomy 20234    HPI: D17-year-old female seen for left-sided chest wall pain as well as left-sided breast pain, She Does have a history of breast cancer status postlumpectomy and radiation therapy 2020. She is accompanied by her husband.  [name] comes in a wheelchair due to deconditioning and weakness  Procedures in the past consistent with several back surgeries, cholecystectomy, appendectomy, appendectomy, knee replacement. States that she hurts all over the place.  She also reports severe pain on the lateral left chest wall worsening when she lays down.  The pain is sharp intermittent severe.  Also she experiences pain in the left breast on the medial aspect.  This is near her lumpectomy incision.  No fevers no chills.  She did have a recent hospitalization for sepsis and pneumonia that was treated with multiple antibiotics.  She is recovering from it.  She has been significantly weak over the last few months. Also had radiation to the left lung  She had a recent CT scan of the chest that I have personally reviewed showing emphysematous changes and a lung nodule that has decreased in size.   She did have an MR RI of the breast on outside facility and they found some rib fractures.  Do not have access to those images.    Past Medical History:  Diagnosis Date   Asthma    Benign neoplasm of colon    Breast cancer ([place]) 08/2018   left breast   Cervical disc disease    "bulging" - no limitations per pt   Chronic diastolic CHF (congestive heart failure) ([place])    a. 07/2012 Echo: Nl EF. mild inferoseptal HK, Gr 2 DD, mild conc LVH, mild PR/TR, mild PAH; b. 08/2019 Echo: EF 55-60%, no rwma, Gr1 DD. Nl RV size/fxn.   CKD (chronic kidney disease), stage III (HCC)    COPD (chronic obstructive pulmonary disease) (HCC)    Coronary artery disease     a. 11/2011 Cath: LAD 24m/d, LCX min irregs, RCA 70p, 186m with L->R collats, EF 60%-->Med Rx; b. 03/2019 Cath: LM nl, LAD 40-58m. LCX large, mild diff dzs. OM1/2 nl. RCA known to be 165m. L-->R collats from LAD & LCX/OM.   Diabetes mellitus without complication (HCC)    Fusion of lumbar spine 12/22/2015   GERD (gastroesophageal reflux disease)    History of DVT (deep vein thrombosis)    History of tobacco abuse    a. Quit 2011.   Hyperlipidemia    Hypertension    Hypothyroidism    Lung cancer ([place])    Obesity    Palpitations    Personal history of radiation therapy    PONV (postoperative nausea and vomiting)    Pulmonary embolus ([place]) 12/25/2013   RLL. Presented with SOB and elevated d-dimer.    PVD (peripheral vascular disease) ([place])    a. PTA of right leg and left femoral artery with stenosis; b. 09/2020 ABI: R 0.93, L 0.88 - unchanged from prior study.   Stroke [facility]) 2015   TIAs - no deficits    Past Surgical History:  Procedure Laterality Date   ANGIOPLASTY / STENTING FEMORAL     PVD; angioplasty right leg and left femoral artery with stenosis.    APPENDECTOMY  12/2014   BACK SURGERY  03/2012   nerve stimulator inserted  BACK SURGERY     screws, rods replaced with new hardware.   BACK SURGERY  11/2016   BREAST LUMPECTOMY Left 08/26/2018   BREAST SURGERY     CARDIAC CATHETERIZATION  12/07/2011   Mid LAD 50%, distal LAD 50%, mid RCA 70%, distal RCA 100%    CARDIAC CATHETERIZATION  01/04/2011   100% occluded mid RCA with good collaterals from distal LAD, normal LVEF.   CATARACT EXTRACTION Right 12/21/2021   CHOLECYSTECTOMY     COLONOSCOPY  2013   COLONOSCOPY WITH PROPOFOL N/A 05/24/2015   Procedure: COLONOSCOPY WITH PROPOFOL;  Surgeon: [name], MD;  Location: [facility];  Service: Endoscopy;  Laterality: N/A;  Diabetic - oral meds   COLONOSCOPY WITH PROPOFOL N/A 01/28/2019   Procedure: COLONOSCOPY WITH PROPOFOL;  Surgeon: [name], MD;  Location:  [facility];  Service: Endoscopy;  Laterality: N/A;   DIAGNOSTIC LAPAROSCOPY     ESOPHAGOGASTRODUODENOSCOPY (EGD) WITH PROPOFOL N/A 01/28/2019   Procedure: ESOPHAGOGASTRODUODENOSCOPY (EGD) WITH PROPOFOL;  Surgeon: [name], MD;  Location: [facility];  Service: Endoscopy;  Laterality: N/A;   LAPAROSCOPIC APPENDECTOMY N/A 01/06/2015   Procedure: APPENDECTOMY LAPAROSCOPIC;  Surgeon: [name], MD;  Location: [facility];  Service: General;  Laterality: N/A;   LEFT HEART CATH AND CORONARY ANGIOGRAPHY Left 04/04/2019   Procedure: LEFT HEART CATH AND CORONARY ANGIOGRAPHY;  Surgeon: [name], MD;  Location: [facility];  Service: Cardiovascular;  Laterality: Left;   MASTECTOMY W/ SENTINEL NODE BIOPSY Left 08/26/2018   Procedure: PARTIAL MASTECTOMY WIDE EXCISION WITH SENTINEL LYMPH NODE BIOPSY LEFT;  Surgeon: [name], MD;  Location: [facility];  Service: General;  Laterality: Left;   POLYPECTOMY  05/24/2015   Procedure: POLYPECTOMY;  Surgeon: [name], MD;  Location: [facility];  Service: Endoscopy;;   REPLACEMENT TOTAL KNEE Right    RIGHT/LEFT HEART CATH AND CORONARY ANGIOGRAPHY Bilateral 09/26/2021   Procedure: RIGHT/LEFT HEART CATH AND CORONARY ANGIOGRAPHY;  Surgeon: Wellington Hampshire, MD;  Location: [facility];  Service: Cardiovascular;  Laterality: Bilateral;   THYROIDECTOMY     TOTAL KNEE ARTHROPLASTY Right 11/13/2019   TRIGGER FINGER RELEASE      Family History  Problem Relation Age of Onset   Cancer Mother        colon   Hypertension Father    Heart disease Father    Heart attack Father    Diabetes Father    Cancer Sister        lung   COPD Sister    COPD Sister    Heart attack Brother    Diabetes Brother    Hypertension Brother    Heart disease Brother    Heart attack Brother    Diabetes Brother    Hypertension Brother    Heart disease Brother    Heart attack Brother    Diabetes Brother     Hypertension Brother    Heart disease Brother    Colon cancer Brother 106   Lung cancer Brother    Breast cancer Neg Hx     Social History:  reports that she quit smoking about 15 years ago. Her smoking use included cigarettes. She has a 7.50 pack-year smoking history. She has never used smokeless tobacco. She reports that she does not drink alcohol and does not use drugs.  Allergies:  Allergies  Allergen Reactions   Hydrocodone Other (See Comments)    Unresponsive   Aleve [Naproxen Sodium]  Hives & itching    Contrast Media [Iodinated Contrast Media]     Pt avoids due to kidney issues   Ioxaglate Other (See Comments)    (iodine) Pt avoids due to kidney issues    Oxycodone Other (See Comments)    hallucinations   Ranexa [Ranolazine]     Sick on stomach   Sulfa Antibiotics Other (See Comments)    Pt avoids due to kidney issues   Tramadol Other (See Comments)    nightmares Other reaction(s): Hallucination, Other (See Comments) nightmares     Medications reviewed.    ROS Full ROS performed and is otherwise negative other than what is stated in HPI   BP (!) 132/50   Pulse (!) 47   Temp 98.7 F (37.1 C) (Oral)   Ht 5\' 6"  (1.676 m)   SpO2 96%   BMI 33.45 kg/m   Physical Exam chaperone present CONSTITUTIONAL: NAD. EYES: Pupils are equal, round, and reactive to light, Sclera are non-icteric. TEARS, NOSE, MOUTH AND THROAT: She is wearing a mask hearing is intact to voice. LYMPH NODES:  Lymph nodes in the neck are normal. RESPIRATORY:  Lungs are clear. There is normal respiratory effort, with equal breath sounds bilaterally, and without pathologic use of accessory muscles. BREAST: Evidence of prior lumpectomy on the left breast upper inner quadrant there is some radiation changes .  He is tender to palpation.  On the right breast it is completely normal.  There is no evidence of axillary lymphadenopathy.  There is also evidence of left chest wall tenderness on the  lateral aspect , No obvious  masses or infection CARDIOVASCULAR: Heart is regular without murmurs, gallops, or rubs. GI: The abdomen is  soft, non tender to palpation.  No obvious ventral hernia defects.  And nondistended. There are no palpable masses. There is no hepatosplenomegaly. There are normal bowel sounds in all quadrants. GU: Rectal deferred.   MUSCULOSKELETAL: Normal muscle strength and tone. No cyanosis or edema.   SKIN: Turgor is good and there are no pathologic skin lesions or ulcers. NEUROLOGIC: Motor and sensation is grossly normal. Cranial nerves are grossly intact. PSYCH:  Oriented to person, place and time. Affect is normal.    Assessment/Plan: 77 year old female with chest wall pain as well as left breast sided pain.  This is likely related to radiation changes.  Unfortunately no good solutions.  I will make sure that there is no other anatomical reasons for the pain and I will obtain a CT scan of the chest.  I will also refer her to pain management and refer her back to radiation oncology.  At this time there is no much that I can offer but I will continue to follow her up to make sure that she is doing okay.  Please note I spent 40 minutes in this encounter and including personally reviewing imaging studies, medical records, coordinating her care, placing orders and performing appropriate documentation. Caroleen Hamman, MD [facility] General Surgeon

## 2022-04-24 NOTE — Patient Instructions (Addendum)
We will try to get a [place]  We will schedule you a CT of the chest to better assess the areas.    We will refer you to Pain Management. They will call you to schedule this.    You have been scheduled for a CT of the Chest without contrast at [facility] on 05/02/22. You will need to arrive there by 4:15 pm.   We will have you follow up here after we get your CT results.

## 2022-04-25 ENCOUNTER — Other Ambulatory Visit: Payer: Self-pay | Admitting: Family Medicine

## 2022-04-25 ENCOUNTER — Telehealth (INDEPENDENT_AMBULATORY_CARE_PROVIDER_SITE_OTHER): Payer: Medicare Other | Admitting: Family Medicine

## 2022-04-25 ENCOUNTER — Encounter: Payer: Self-pay | Admitting: Family Medicine

## 2022-04-25 DIAGNOSIS — Z794 Long term (current) use of insulin: Secondary | ICD-10-CM

## 2022-04-25 DIAGNOSIS — Z7985 Long-term (current) use of injectable non-insulin antidiabetic drugs: Secondary | ICD-10-CM

## 2022-04-25 DIAGNOSIS — E1165 Type 2 diabetes mellitus with hyperglycemia: Secondary | ICD-10-CM | POA: Diagnosis not present

## 2022-04-25 MED ORDER — TIRZEPATIDE 12.5 MG/0.5ML ~~LOC~~ SOAJ
12.5000 mg | SUBCUTANEOUS | 1 refills | Status: DC
  Filled 2022-06-01: qty 2, 28d supply, fill #0
  Filled 2022-06-28: qty 2, 28d supply, fill #1
  Filled 2022-08-02: qty 2, 28d supply, fill #2
  Filled 2022-08-29: qty 2, 28d supply, fill #3

## 2022-04-25 MED ORDER — TRELEGY ELLIPTA 100-62.5-25 MCG/ACT IN AEPB: 1.0000 | INHALATION_SPRAY | Freq: Every day | RESPIRATORY_TRACT | 4 refills | Status: DC

## 2022-04-25 NOTE — Progress Notes (Signed)
There were no vitals taken for this visit.   Subjective:    Patient ID: Tammy [name], female    DOB: 22-Nov-1945, 77 y.o.   MRN: [id number]  HPI: Tammy [name] is a 77 y.o. female  Chief Complaint  Patient presents with   Diabetes   DIABETES Hypoglycemic episodes:no Polydipsia/polyuria: no Visual disturbance: no Chest pain: no Paresthesias: no Glucose Monitoring: yes  Accucheck frequency: continuous  Fasting glucose: 111, 126  Post prandial: 242 Taking Insulin?: yes  Long acting insulin:10 units Blood Pressure Monitoring: not checking Retinal Examination: Up to Date Foot Exam: Not up to Date Diabetic Education: Completed Pneumovax: Up to Date Influenza: Up to Date Aspirin: yes  Relevant past medical, surgical, family and social history reviewed and updated as indicated. Interim medical history since our last visit reviewed. Allergies and medications reviewed and updated.  Review of Systems  Constitutional: Negative.   Respiratory: Negative.    Cardiovascular: Negative.   Gastrointestinal: Negative.   Neurological: Negative.   Psychiatric/Behavioral: Negative.      Per HPI unless specifically indicated above     Objective:    There were no vitals taken for this visit.  Wt Readings from Last 3 Encounters:  03/07/22 207 lb 3.7 oz (94 kg)  02/24/22 208 lb 15.9 oz (94.8 kg)  02/16/22 209 lb (94.8 kg)    Physical Exam Vitals and nursing note reviewed.  Constitutional:      General: She is not in acute distress.    Appearance: Normal appearance. She is obese. She is not ill-appearing, toxic-appearing or diaphoretic.  HENT:     Head: Normocephalic and atraumatic.     Right Ear: External ear normal.     Left Ear: External ear normal.     Nose: Nose normal.     Mouth/Throat:     Mouth: Mucous membranes are moist.     Pharynx: Oropharynx is clear.  Eyes:     General: No scleral icterus.       Right eye: No discharge.        Left eye: No discharge.      Conjunctiva/sclera: Conjunctivae normal.     Pupils: Pupils are equal, round, and reactive to light.  Pulmonary:     Effort: Pulmonary effort is normal. No respiratory distress.     Comments: Speaking in full sentences Musculoskeletal:        General: Normal range of motion.     Cervical back: Normal range of motion.  Skin:    Coloration: Skin is not jaundiced or pale.     Findings: No bruising, erythema, lesion or rash.  Neurological:     Mental Status: She is alert and oriented to person, place, and time. Mental status is at baseline.  Psychiatric:        Mood and Affect: Mood normal.        Behavior: Behavior normal.        Thought Content: Thought content normal.        Judgment: Judgment normal.     Results for orders placed or performed in visit on 03/16/22  Bayer DCA Hb A1c Waived  Result Value Ref Range   HB A1C (BAYER DCA - WAIVED) 7.4 (H) 4.8 - 5.6 %  CBC with Differential/Platelet  Result Value Ref Range   WBC 9.3 3.4 - 10.8 x10E3/uL   RBC 4.85 3.77 - 5.28 x10E6/uL   Hemoglobin 13.7 11.1 - 15.9 g/dL   Hematocrit 42.9 34.0 - 46.6 %  MCV 89 79 - 97 fL   MCH 28.2 26.6 - 33.0 pg   MCHC 31.9 31.5 - 35.7 g/dL   RDW 14.1 11.7 - 15.4 %   Platelets 412 150 - 450 x10E3/uL   Neutrophils 54 Not Estab. %   Lymphs 26 Not Estab. %   Monocytes 12 Not Estab. %   Eos 4 Not Estab. %   Basos 1 Not Estab. %   Neutrophils Absolute 5.1 1.4 - 7.0 x10E3/uL   Lymphocytes Absolute 2.4 0.7 - 3.1 x10E3/uL   Monocytes Absolute 1.1 (H) 0.1 - 0.9 x10E3/uL   EOS (ABSOLUTE) 0.4 0.0 - 0.4 x10E3/uL   Basophils Absolute 0.1 0.0 - 0.2 x10E3/uL   Immature Granulocytes 3 Not Estab. %   Immature Grans (Abs) 0.3 (H) 0.0 - 0.1 x10E3/uL  Comprehensive metabolic panel  Result Value Ref Range   Glucose 85 70 - 99 mg/dL   BUN 20 8 - 27 mg/dL   Creatinine, Ser 1.25 (H) 0.57 - 1.00 mg/dL   eGFR 44 (L) >59 mL/min/1.73   BUN/Creatinine Ratio 16 12 - 28   Sodium 142 134 - 144 mmol/L   Potassium 4.7  3.5 - 5.2 mmol/L   Chloride 102 96 - 106 mmol/L   CO2 25 20 - 29 mmol/L   Calcium 9.2 8.7 - 10.3 mg/dL   Total Protein 6.4 6.0 - 8.5 g/dL   Albumin 3.8 3.8 - 4.8 g/dL   Globulin, Total 2.6 1.5 - 4.5 g/dL   Albumin/Globulin Ratio 1.5 1.2 - 2.2   Bilirubin Total 0.3 0.0 - 1.2 mg/dL   Alkaline Phosphatase 84 44 - 121 IU/L   AST 29 0 - 40 IU/L   ALT 22 0 - 32 IU/L  Lipid Panel w/o Chol/HDL Ratio  Result Value Ref Range   Cholesterol, Total 136 100 - 199 mg/dL   Triglycerides 175 (H) 0 - 149 mg/dL   HDL 46 >39 mg/dL   VLDL Cholesterol Cal 29 5 - 40 mg/dL   LDL Chol Calc (NIH) 61 0 - 99 mg/dL  TSH  Result Value Ref Range   TSH 0.812 0.450 - 4.500 uIU/mL   *Note: Due to a large number of results and/or encounters for the requested time period, some results have not been displayed. A complete set of results can be found in Results Review.      Assessment & Plan:   Problem List Items Addressed This Visit       Endocrine   Uncontrolled type 2 diabetes mellitus with hyperglycemia, with long-term current use of insulin ([place]) - Primary    Tolerating her medicine well. Will increase her mounjaro to 12.5mg  and stop her insulin. Recheck 1 month. Call with any concerns.         Follow up plan: Return in about 4 weeks (around 05/23/2022).    This visit was completed via video visit through MyChart due to the restrictions of the COVID-19 pandemic. All issues as above were discussed and addressed. Physical exam was done as above through visual confirmation on video through MyChart. If it was felt that the patient should be evaluated in the office, they were directed there. The patient verbally consented to this visit. Location of the patient: home Location of the provider: work Those involved with this call:  Provider: Park Liter, DO CMA: Irena Reichmann, [place] Desk/Registration: [employer]  Time spent on call:  15 minutes with patient face to face via video conference. More  than 50% of this  time was spent in counseling and coordination of care. 23 minutes total spent in review of patient's record and preparation of their chart.

## 2022-04-25 NOTE — Progress Notes (Signed)
Appt made

## 2022-04-25 NOTE — Assessment & Plan Note (Signed)
Tolerating her medicine well. Will increase her mounjaro to 12.5mg  and stop her insulin. Recheck 1 month. Call with any concerns.

## 2022-04-26 NOTE — Telephone Encounter (Signed)
Requested medication (s) are due for refill today - yes  Requested medication (s) are on the active medication list -yes  Future visit scheduled -yes  Last refill: 01/30/22  Notes to clinic: listed as historical   Requested Prescriptions  Pending Prescriptions Disp Refills   gabapentin (NEURONTIN) 400 MG capsule [Pharmacy Med Name: GABAPENTIN 400 MG CAP] 270 capsule     Sig: TAKE 1 CAPSULE BY MOUTH THREE TIMES DAILY     Neurology: Anticonvulsants - gabapentin Failed - 04/25/2022  2:30 PM      Failed - Cr in normal range and within 360 days    Creatinine  Date Value Ref Range Status  09/20/2011 1.31 (H) 0.60 - 1.30 mg/dL Final   Creatinine, Ser  Date Value Ref Range Status  03/16/2022 1.25 (H) 0.57 - 1.00 mg/dL Final         Passed - Completed PHQ-2 or PHQ-9 in the last 360 days      Passed - Valid encounter within last 12 months    Recent Outpatient Visits           Yesterday Uncontrolled type 2 diabetes mellitus with hyperglycemia, with long-term current use of insulin ([place])   [place], Megan P, DO   3 weeks ago Uncontrolled type 2 diabetes mellitus with hyperglycemia, with long-term current use of insulin ([place])   [place], Megan P, DO   1 month ago Influenza A with pneumonia   [place] [facility] [place], Megan P, DO   2 months ago [place], Megan P, DO   3 months ago Chest pain, unspecified type   [place], Barb Merino, DO       Future Appointments             In 6 days Goll[place], [name], MD [place] at [place]   In 3 weeks [name], DO [place], [facility]   In 4 weeks Wynetta Emery, Barb Merino, DO [place], [facility]               Requested Prescriptions  Pending Prescriptions Disp Refills   gabapentin (NEURONTIN) 400 MG capsule  [Pharmacy Med Name: GABAPENTIN 400 MG CAP] 270 capsule     Sig: TAKE 1 CAPSULE BY MOUTH THREE TIMES DAILY     Neurology: Anticonvulsants - gabapentin Failed - 04/25/2022  2:30 PM      Failed - Cr in normal range and within 360 days    Creatinine  Date Value Ref Range Status  09/20/2011 1.31 (H) 0.60 - 1.30 mg/dL Final   Creatinine, Ser  Date Value Ref Range Status  03/16/2022 1.25 (H) 0.57 - 1.00 mg/dL Final         Passed - Completed PHQ-2 or PHQ-9 in the last 360 days      Passed - Valid encounter within last 12 months    Recent Outpatient Visits           Yesterday Uncontrolled type 2 diabetes mellitus with hyperglycemia, with long-term current use of insulin ([place])   [place], Megan P, DO   3 weeks ago Uncontrolled type 2 diabetes mellitus with hyperglycemia, with long-term current use of insulin ([place])   [place], Megan P, DO   1 month ago Influenza A with pneumonia   [place], [place], DO   2 months ago [place] P, DO   3 months ago Chest pain, unspecified type   [place], [place], DO       Future Appointments             In 6 days Gollan, [name], MD [place] at [place]   In 3 weeks [name], DO [place], [place]   In 4 weeks Wynetta Emery, Barb Merino, DO [place], [place]

## 2022-05-01 NOTE — Progress Notes (Unsigned)
Date:  05/02/2022   ID:  Tammy [name], DOB [birth year], MRN [id number]  Patient Location:  [place] [id number]   Provider location:   [facility], [place] office  PCP:  [name], DO  Cardiologist:  [name]  Chief Complaint  Patient presents with   12 month follow up      Patient c/o chest pain; patient fractured her ribs and feels the pain is coming from that. Medications reviewed by the patient verbally.     History of Present Illness:    Tammy [name] is a 77 y.o. female  past medical history of coronary artery disease, occluded mid RCA, in 2013  Nonobstructive LAD disease PAD with angioplasty of the legs bilaterally,  COPD,  chronic kidney disease diabetes type 2, diagnosis of PE in October 2015, started on anticoagulation, eliquis hypertension,  back surgery January 2014 with rods placed at that time,  remote smoking history stopped 5 years ago,  obesity,  deconditioned secondary to chronic back pain  catheterization January 2021 occluded RCA with collaterals nonobstructive LAD disease Cardiac catheterization September 26, 2021, stable disease, medical management recommended who presents for routine followup of her coronary artery disease   Last seen in clinic by myself August 2023 At that time reported more episodes of chest pain and she was scheduled for cardiac catheterization, details as below  Difficult December 2023, in the hospital x 2 Diagnosed with influenza February 24, 2022, readmitted 12/26 Influenza A with pneumonia Septic Shock 2/2 to Influenza A with PNA Presenting symptoms :shortness of breath, weakness, lethargy, saturations 85%  Since then has made full recovery, presenting today in wheelchair Having claudication  on right, followed by Dr. Lucky Cowboy ABIs with mild disease several months ago Despite that she reports having symptoms  Echocardiogram 03/10/22 reviewed detailing ejection fraction 55  to 60%, no other significant abnormality noted  Cardiac catheterization performed September 26, 2021   Mid LAD lesion is 45% stenosed.   Prox RCA to Mid RCA lesion is 100% stenosed.   Prox RCA lesion is 90% stenosed.   1.  Severe one-vessel coronary artery disease with chronically occluded right coronary artery with well-developed left-to-right collaterals.   Stable moderate mid LAD disease. 2.  Left ventricular angiography was not performed due to chronic kidney disease.  EF was normal by echo. 3.  Right heart catheterization showed mildly elevated filling pressures, mild pulmonary hypertension and normal cardiac output. RA: 8 mmHg RV: 47/5 mmHg PA: 48/18 with a mean of 31 mmHg Cardiac output: 6.38 with an index of 3.08. No significant change in coronary anatomy since most recent cardiac catheterization.  Lab work reviewed Total cholesterol 136, LDL 61 A1c 7.4  EKG personally reviewed by myself on todays visit Normal sinus rhythm with rate 90 bpm left axis deviation  In the hospital end of March 2023 for chest pain, UTI  Stress test performed April 2023, no ischemia low risk study  Lab work reviewed Total cholesterol 137 LDL 56 A1c 8.5 has been trending upwards Creatinine 1.66 BUN 29  Echo 09/03/19 for SOB Left ventricular ejection fraction, by estimation, is 55 to 60%. The  left ventricle has normal function. The left ventricle has no regional  wall motion abnormalities. Left ventricular diastolic parameters are  consistent with Grade I diastolic  dysfunction (impaired relaxation).   Cardiac cath 04/04/2019 Nonobstructive disease, stable mild to moderate mid LAD disease 45% Known occluded RCA with collaterals  from left to right  Completed breast surgery with [name] after diagnosis of breast cancer  Chronic back pain, stimulator in place ("does not use it, does not work") On Cymbalta  Several back surgeries most recently March 2018  back surgery October  2017  Previously in the emergency room, treated for bronchial asthma. Takes her albuterol with some improvement of her symptoms. Shortness of breath symptoms have been dating back for many years.    may have developed a DVT from sitting long periods of time.     prior heart catheterizations in 2012 and 2013. Catheterization in 2012 documented occluded mid RCA with left to right collaterals, 40% mid LAD disease, 40% mid circumflex disease, 30% proximal LAD disease Catheterization in September 2013 showed normal LV function, occluded mid RCA with left to right collaterals, 50% mid and distal LAD disease   Stress test 03/28/2018 Low risk  CT chest 02/2018   Past Medical History:  Diagnosis Date   Asthma    Benign neoplasm of colon    Breast cancer ([place]) 08/2018   left breast   Cervical disc disease    "bulging" - no limitations per pt   Chronic diastolic CHF (congestive heart failure) ([place])    a. 07/2012 Echo: Nl EF. mild inferoseptal HK, Gr 2 DD, mild conc LVH, mild PR/TR, mild PAH; b. 08/2019 Echo: EF 55-60%, no rwma, Gr1 DD. Nl RV size/fxn.   CKD (chronic kidney disease), stage III (HCC)    COPD (chronic obstructive pulmonary disease) (HCC)    Coronary artery disease    a. 11/2011 Cath: LAD 25m/d, LCX min irregs, RCA 70p, 136m with L->R collats, EF 60%-->Med Rx; b. 03/2019 Cath: LM nl, LAD 40-44m. LCX large, mild diff dzs. OM1/2 nl. RCA known to be 147m. L-->R collats from LAD & LCX/OM.   Diabetes mellitus without complication (HCC)    Fusion of lumbar spine 12/22/2015   GERD (gastroesophageal reflux disease)    History of DVT (deep vein thrombosis)    History of tobacco abuse    a. Quit 2011.   Hyperlipidemia    Hypertension    Hypothyroidism    Lung cancer ([place])    Obesity    Palpitations    Personal history of radiation therapy    PONV (postoperative nausea and vomiting)    Pulmonary embolus ([place]) 12/25/2013   RLL. Presented with SOB and elevated d-dimer.    PVD  (peripheral vascular disease) ([place])    a. PTA of right leg and left femoral artery with stenosis; b. 09/2020 ABI: R 0.93, L 0.88 - unchanged from prior study.   Stroke [facility]) 2015   TIAs - no deficits   Past Surgical History:  Procedure Laterality Date   ANGIOPLASTY / STENTING FEMORAL     PVD; angioplasty right leg and left femoral artery with stenosis.    APPENDECTOMY  12/2014   BACK SURGERY  03/2012   nerve stimulator inserted   BACK SURGERY     screws, rods replaced with new hardware.   BACK SURGERY  11/2016   BREAST LUMPECTOMY Left 08/26/2018   BREAST SURGERY     CARDIAC CATHETERIZATION  12/07/2011   Mid LAD 50%, distal LAD 50%, mid RCA 70%, distal RCA 100%    CARDIAC CATHETERIZATION  01/04/2011   100% occluded mid RCA with good collaterals from distal LAD, normal LVEF.   CATARACT EXTRACTION Right 12/21/2021   CHOLECYSTECTOMY     COLONOSCOPY  2013   COLONOSCOPY WITH PROPOFOL N/A 05/24/2015  Procedure: COLONOSCOPY WITH PROPOFOL;  Surgeon: [name], MD;  Location: [place];  Service: Endoscopy;  Laterality: N/A;  Diabetic - oral meds   COLONOSCOPY WITH PROPOFOL N/A 01/28/2019   Procedure: COLONOSCOPY WITH PROPOFOL;  Surgeon: [name], MD;  Location: [facility];  Service: Endoscopy;  Laterality: N/A;   DIAGNOSTIC LAPAROSCOPY     ESOPHAGOGASTRODUODENOSCOPY (EGD) WITH PROPOFOL N/A 01/28/2019   Procedure: ESOPHAGOGASTRODUODENOSCOPY (EGD) WITH PROPOFOL;  Surgeon: [name], MD;  Location: [facility];  Service: Endoscopy;  Laterality: N/A;   LAPAROSCOPIC APPENDECTOMY N/A 01/06/2015   Procedure: APPENDECTOMY LAPAROSCOPIC;  Surgeon: [name], MD;  Location: [facility];  Service: General;  Laterality: N/A;   LEFT HEART CATH AND CORONARY ANGIOGRAPHY Left 04/04/2019   Procedure: LEFT HEART CATH AND CORONARY ANGIOGRAPHY;  Surgeon: [name], MD;  Location: [place] [facility];  Service: Cardiovascular;  Laterality: Left;    MASTECTOMY W/ SENTINEL NODE BIOPSY Left 08/26/2018   Procedure: PARTIAL MASTECTOMY WIDE EXCISION WITH SENTINEL LYMPH NODE BIOPSY LEFT;  Surgeon: [name], MD;  Location: [facility];  Service: General;  Laterality: Left;   POLYPECTOMY  05/24/2015   Procedure: POLYPECTOMY;  Surgeon: [name], MD;  Location: [place];  Service: Endoscopy;;   REPLACEMENT TOTAL KNEE Right    RIGHT/LEFT HEART CATH AND CORONARY ANGIOGRAPHY Bilateral 09/26/2021   Procedure: RIGHT/LEFT HEART CATH AND CORONARY ANGIOGRAPHY;  Surgeon: Wellington Hampshire, MD;  Location: [place] [facility];  Service: Cardiovascular;  Laterality: Bilateral;   THYROIDECTOMY     TOTAL KNEE ARTHROPLASTY Right 11/13/2019   TRIGGER FINGER RELEASE       Current Meds  Medication Sig   acetaminophen (TYLENOL) 325 MG tablet Take 2 tablets (650 mg total) by mouth every 6 (six) hours as needed for mild pain (or Fever >/= 101).   albuterol (PROVENTIL) (2.5 MG/3ML) 0.083% nebulizer solution Take 3 mLs (2.5 mg total) by nebulization every 6 (six) hours as needed for wheezing or shortness of breath.   albuterol (VENTOLIN HFA) 108 (90 Base) MCG/ACT inhaler Inhale 2 puffs into the lungs every 6 (six) hours as needed for wheezing or shortness of breath.   anastrozole (ARIMIDEX) 1 MG tablet TAKE 1 TABLET BY MOUTH DAILY   apixaban (ELIQUIS) 2.5 MG TABS tablet Take 1 tablet (2.5 mg total) by mouth 2 (two) times daily.   benzonatate (TESSALON) 200 MG capsule Take 1 capsule (200 mg total) by mouth 2 (two) times daily as needed for cough.   Calcium Carb-Cholecalciferol (CALCIUM 600 + D) 600-200 MG-UNIT TABS Take 1 tablet by mouth daily.    cholecalciferol (VITAMIN D3) 25 MCG (1000 UT) tablet Take 1,000 Units by mouth daily.   CRANBERRY PO Take 2 capsules by mouth daily.   DULoxetine (CYMBALTA) 60 MG capsule Take 2 capsules (120 mg total) by mouth daily.   fluticasone (FLONASE) 50 MCG/ACT nasal spray Place 1 spray into both nostrils daily.    Fluticasone-Umeclidin-Vilant (TRELEGY ELLIPTA) 100-62.5-25 MCG/ACT AEPB Inhale 1 puff into the lungs daily.   gabapentin (NEURONTIN) 400 MG capsule TAKE 1 CAPSULE BY MOUTH THREE TIMES DAILY   glipiZIDE (GLUCOTROL) 5 MG tablet TAKE 1 TABLET BY MOUTH DAILY BEFORE BREAKFAST   isosorbide mononitrate (IMDUR) 60 MG 24 hr tablet TAKE ONE TABLET BY MOUTH AT BEDTIME   Lactobacillus (PROBIOTIC ACIDOPHILUS PO) Take 1 capsule by mouth daily at 12 noon.   metoprolol tartrate (LOPRESSOR) 25 MG tablet Take 1 tablet (25 mg total) by mouth 2 (two)  times daily.   montelukast (SINGULAIR) 10 MG tablet Take 1 tablet (10 mg total) by mouth at bedtime.   MYRBETRIQ 50 MG TB24 tablet TAKE 1 TABLET BY MOUTH DAILY   nitroGLYCERIN (NITROSTAT) 0.4 MG SL tablet Place 1 tablet (0.4 mg total) under the tongue every 5 (five) minutes as needed for chest pain. Total of 3 doses   ondansetron (ZOFRAN) 4 MG tablet Take 1 tablet (4 mg total) by mouth every 6 (six) hours as needed for nausea.   oxyCODONE-acetaminophen (PERCOCET/ROXICET) 5-325 MG tablet TAKE 1/2 TO 1 TABLET BY MOUTH EVERY 8 HOURS AS NEEDED FOR SEVERE PAIN   pantoprazole (PROTONIX) 40 MG tablet TAKE 1 TABLET BY MOUTH DAILY   Pumpkin Seed-Soy Germ (AZO BLADDER CONTROL/GO-LESS PO) Take 1 tablet by mouth in the morning and at bedtime.   rosuvastatin (CRESTOR) 40 MG tablet Take 1 tablet (40 mg total) by mouth daily.   SYNTHROID 150 MCG tablet Take 1 tablet (150 mcg total) by mouth daily before breakfast.   tirzepatide (MOUNJARO) 12.5 MG/0.5ML Pen Inject 12.5 mg into the skin once a week.   trimethoprim (TRIMPEX) 100 MG tablet TAKE ONE TABLET BY MOUTH EVERY DAY   UNABLE TO FIND Med Name: Energy, memory and mood enhancer   ZINC CITRATE PO Take 1 tablet by mouth daily.     Allergies:   Hydrocodone, Aleve [naproxen sodium], Contrast media [iodinated contrast media], Ioxaglate, Oxycodone, Ranexa [ranolazine], Sulfa antibiotics, and Tramadol   Social History   Tobacco Use    Smoking status: Former    Packs/day: 0.50    Years: 15.00    Total pack years: 7.50    Types: Cigarettes    Quit date: 03/14/2007    Years since quitting: 15.1   Smokeless tobacco: Never  Vaping Use   Vaping Use: Never used  Substance Use Topics   Alcohol use: No    Alcohol/week: 0.0 standard drinks of alcohol   Drug use: Never     Family Hx: The patient's family history includes COPD in her sister and sister; Cancer in her mother and sister; Colon cancer (age of onset: 29) in her brother; Diabetes in her brother, brother, brother, and father; Heart attack in her brother, brother, brother, and father; Heart disease in her brother, brother, brother, and father; Hypertension in her brother, brother, brother, and father; Lung cancer in her brother. There is no history of Breast cancer.  ROS:   Please see the history of present illness.    Review of Systems  Constitutional: Negative.   HENT: Negative.    Respiratory: Negative.    Cardiovascular: Negative.   Gastrointestinal: Negative.   Musculoskeletal: Negative.   Neurological: Negative.   Psychiatric/Behavioral: Negative.    All other systems reviewed and are negative.    Labs/Other Tests and Data Reviewed:    Recent Labs: 03/07/2022: B Natriuretic Peptide 58.6 03/10/2022: Magnesium 2.5 03/16/2022: ALT 22; BUN 20; Creatinine, Ser 1.25; Hemoglobin 13.7; Platelets 412; Potassium 4.7; Sodium 142; TSH 0.812   Recent Lipid Panel Lab Results  Component Value Date/Time   CHOL 136 03/16/2022 01:27 PM   CHOL 152 01/31/2016 02:19 PM   TRIG 175 (H) 03/16/2022 01:27 PM   TRIG 224 (H) 01/31/2016 02:19 PM   HDL 46 03/16/2022 01:27 PM   CHOLHDL 3.2 09/07/2015 08:05 AM   CHOLHDL 3.4 12/17/2014 10:17 AM   LDLCALC 61 03/16/2022 01:27 PM    Wt Readings from Last 3 Encounters:  05/02/22 200 lb (90.7 kg)  03/07/22 207  lb 3.7 oz (94 kg)  02/24/22 208 lb 15.9 oz (94.8 kg)     Exam:    Vital Signs: Vital signs may also be detailed in  the HPI BP 120/60 (BP Location: Right Arm, Patient Position: Sitting, Cuff Size: Normal)   Pulse 90   Ht 5\' 6"  (1.676 m)   Wt 200 lb (90.7 kg)   SpO2 96%   BMI 32.28 kg/m   Presenting in a wheelchair Constitutional:  oriented to person, place, and time. No distress.  HENT:  Head: Grossly normal Eyes:  no discharge. No scleral icterus.  Neck: No JVD, no carotid bruits  Cardiovascular: Regular rate and rhythm, no murmurs appreciated Pulmonary/Chest: Clear to auscultation bilaterally, no wheezes or rails Abdominal: Soft.  no distension.  no tenderness.  Musculoskeletal: Normal range of motion Neurological:  normal muscle tone. Coordination normal. No atrophy Skin: Skin warm and dry Psychiatric: normal affect, pleasant   ASSESSMENT & PLAN:    Coronary artery disease with stable angina Chest pain with hospitalization March 2023 Stress test April 2023 no significant ischemia cardiac catheterization performed with stable disease occluded RCA with collaterals from left to right, Broken ribs noted on CT scan, reports she is scheduled for repeat CT  Abdominal aortic atherosclerosis  cholesterol at goal Stressed importance of getting A1c down She reports weight has been trending down, down 16 pounds  PAD (peripheral artery disease) (HCC) Aggressive lipid management recommended Mild drop in ABIs bilaterally, has follow-up with Dr. Lucky Cowboy Reports having claudication type symptoms  Morbid obesity due to excess calories ([place]) With diagnosis of type 2 diabetes, peripheral arterial disease, essential hypertension, hyperlipidemia, aortic atherosclerosis Weight trending down, reports she does her own exercise at home Potentially might be a candidate for Ozempic/Mounjaro  Essential hypertension Blood pressure is well controlled on today's visit. No changes made to the medications.  Uncontrolled type 2 diabetes mellitus with complication, without long-term current use of insulin (HCC) A1c  trending down Weight trending down  Mixed hyperlipidemia Cholesterol is at goal on the current lipid regimen. No changes to the medications were made.  History of DVT and PE Continue Eliquis 2.5 mg twice daily prophylactic dosing We will hold Eliquis for cardiac catheterization  Influenza with sepsis, pneumonia Hospital records reviewed, has made full recovery   Total encounter time more than 40 minutes  Greater than 50% was spent in counseling and coordination of care with the patient    Signed, Ida Rogue, MD  05/02/2022 2:46 PM    [place] Office [address], [place], [place] [address]

## 2022-05-02 ENCOUNTER — Encounter: Payer: Self-pay | Admitting: Cardiovascular Disease

## 2022-05-02 ENCOUNTER — Ambulatory Visit: Payer: Medicare Other | Attending: Cardiovascular Disease | Admitting: Cardiovascular Disease

## 2022-05-02 ENCOUNTER — Ambulatory Visit (INDEPENDENT_AMBULATORY_CARE_PROVIDER_SITE_OTHER): Payer: Medicare Other

## 2022-05-02 ENCOUNTER — Telehealth: Payer: Medicare Other

## 2022-05-02 ENCOUNTER — Ambulatory Visit
Admission: RE | Admit: 2022-05-02 | Discharge: 2022-05-02 | Disposition: A | Discharge status: Home / Self Care | Payer: Medicare Other | Source: Ambulatory Visit | Attending: Surgery | Admitting: Surgery

## 2022-05-02 VITALS — BP 120/60 | HR 90 | Ht 66.0 in | Wt 200.0 lb

## 2022-05-02 DIAGNOSIS — I5032 Chronic diastolic (congestive) heart failure: Secondary | ICD-10-CM | POA: Diagnosis not present

## 2022-05-02 DIAGNOSIS — R0781 Pleurodynia: Secondary | ICD-10-CM | POA: Diagnosis not present

## 2022-05-02 DIAGNOSIS — S2242XG Multiple fractures of ribs, left side, subsequent encounter for fracture with delayed healing: Secondary | ICD-10-CM | POA: Insufficient documentation

## 2022-05-02 DIAGNOSIS — Z9181 History of falling: Secondary | ICD-10-CM

## 2022-05-02 DIAGNOSIS — I25118 Atherosclerotic heart disease of native coronary artery with other forms of angina pectoris: Secondary | ICD-10-CM

## 2022-05-02 DIAGNOSIS — E1151 Type 2 diabetes mellitus with diabetic peripheral angiopathy without gangrene: Secondary | ICD-10-CM | POA: Insufficient documentation

## 2022-05-02 DIAGNOSIS — N183 Chronic kidney disease, stage 3 unspecified: Secondary | ICD-10-CM | POA: Insufficient documentation

## 2022-05-02 DIAGNOSIS — Z9012 Acquired absence of left breast and nipple: Secondary | ICD-10-CM

## 2022-05-02 DIAGNOSIS — I739 Peripheral vascular disease, unspecified: Secondary | ICD-10-CM | POA: Diagnosis not present

## 2022-05-02 DIAGNOSIS — R0789 Other chest pain: Secondary | ICD-10-CM | POA: Diagnosis not present

## 2022-05-02 DIAGNOSIS — I70211 Atherosclerosis of native arteries of extremities with intermittent claudication, right leg: Secondary | ICD-10-CM

## 2022-05-02 DIAGNOSIS — I10 Essential (primary) hypertension: Secondary | ICD-10-CM | POA: Diagnosis not present

## 2022-05-02 DIAGNOSIS — E785 Hyperlipidemia, unspecified: Secondary | ICD-10-CM

## 2022-05-02 DIAGNOSIS — E1165 Type 2 diabetes mellitus with hyperglycemia: Secondary | ICD-10-CM

## 2022-05-02 DIAGNOSIS — J449 Chronic obstructive pulmonary disease, unspecified: Secondary | ICD-10-CM | POA: Diagnosis not present

## 2022-05-02 DIAGNOSIS — J439 Emphysema, unspecified: Secondary | ICD-10-CM | POA: Diagnosis not present

## 2022-05-02 DIAGNOSIS — Z86711 Personal history of pulmonary embolism: Secondary | ICD-10-CM | POA: Insufficient documentation

## 2022-05-02 DIAGNOSIS — I700 Atherosclerosis of aorta: Secondary | ICD-10-CM | POA: Diagnosis not present

## 2022-05-02 NOTE — Chronic Care Management (AMB) (Signed)
Chronic Care Management   CCM RN Visit Note  05/02/2022 Name: Tammy [name] MRN: [id number] DOB: May 23, 1945  Subjective: Tammy [name] is a 77 y.o. year old female who is a primary care patient of [name], DO. The patient was referred to the Chronic Care Management team for assistance with care management needs subsequent to provider initiation of CCM services and plan of care.    Today's Visit:  Engaged with patient by telephone for follow up visit.        Goals Addressed             This Visit's Progress    CCM (DIABETES) EXPECTED OUTCOME: MONITOR, SELF-MANAGE AND REDUCE SYMPTOMS OF DIABETES       Current Barriers:  Knowledge Deficits related to how to effectively manage and maintain DM and goal of A1C <8% Chronic Disease Management support and education needs related to effective management DM Goal set by the provider <8.1% Lab Results  Component Value Date   HGBA1C 7.4 (H) 03/16/2022     Planned Interventions: Provided education to patient about basic DM disease process. Education provided; Reviewed medications with patient and discussed importance of medication adherence. The patient is compliant with medications. She is now taking Mounjarou 12.5mg  and is doing well with the Mounjarou. She is no longer having to take insulin. She is pleased with her medications.  Education and support given;        Reviewed prescribed diet with patient heart healthy/ADA diet. The patient is doing well and says that she has lost weight. She is eating but not snacking like she was. She will drink boost every now and then to help with protein intake. Education and support given.  Counseled on importance of regular laboratory monitoring as prescribed. Labs are up to date. Denies any acute findings. A1C is stable but trending down.;        Discussed plans with patient for ongoing care management follow up and provided patient with direct contact information for care management team;       Provided patient with written educational materials related to hypo and hyperglycemia and importance of correct treatment. Review of how to effectively manage drops in blood sugars or highs. The patient states the [facility] is really helpful and alerts her when her blood sugars is high or low. She denies any highs or lows. The highest she has seen is 242 and she knew how to correct it. She likes having the freestyle libre for monitoring of her blood sugars;       Reviewed scheduled/upcoming provider appointments including: 05-18-2022;         Advised patient, providing education and rationale, to check cbg when you have symptoms of low or high blood sugar and has freestyle libre  and record. States her blood sugars this am was 111. She says they are staying in range well. Will continue to monitor.  call provider for findings outside established parameters;       Review of patient status, including review of consultants reports, relevant laboratory and other test results, and medications completed;       Advised patient to discuss changes in DM and diabetes health with provider;      Screening for signs and symptoms of depression related to chronic disease state;        Assessed social determinant of health barriers;         Symptom Management: Take medications as prescribed   Attend all scheduled provider  appointments Call pharmacy for medication refills 3-7 days in advance of running out of medications Call provider office for new concerns or questions  call the Suicide and Crisis Lifeline: 988 call the [place] National Suicide Prevention Lifeline: 8197039178 or TTY: (785) 052-2231 TTY 573-421-7179) to talk to a trained counselor call 1-800-273-TALK (toll free, 24 hour hotline) if experiencing a Mental Health or [place]  check feet daily for cuts, sores or redness trim toenails straight across manage portion size wash and dry feet carefully every day wear comfortable,  cotton socks wear comfortable, well-fitting shoes  Follow Up Plan: Telephone follow up appointment with care management team member scheduled for: 06-20-2022 at 31 am       CCM Expected Outcome:  Monitor, Self-Manage and Reduce Symptoms of CAD       Current Barriers:  Chronic Disease Management support and education needs related to effective management of CAD BP Readings from Last 3 Encounters:  02/25/22 (!) 111/45  02/16/22 111/77  02/14/22 111/63     Planned Interventions: Assessed understanding of CAD diagnosis. The patient states that she is much better and having a good day today. Saw the cardiologist recently and got a good report. Denies any new concerns with CAD or heart health.  Medications reviewed including medications utilized in CAD treatment plan. Is compliant with medications. Denies any new concerns with medications.  Provided education on importance of blood pressure control in management of CAD. Her blood pressures are stable. She denies any acute findings with changes in her blood pressures. ; Provided education on Importance of limiting foods high in cholesterol. Has lost weight, eats healthy, does not eat between meals.; Counseled on importance of regular laboratory monitoring as prescribed. The patient has regular lab work.; Reviewed Importance of taking all medications as prescribed. Takes medications as prescribed.  Reviewed Importance of attending all scheduled provider appointments. Next appointment with pcp is 05-18-2022- reminder given today.  Advised to report any changes in symptoms or exercise tolerance Advised patient to discuss changes in CAD or heart health with provider; Screening for signs and symptoms of depression related to chronic disease state;  Assessed social determinant of health barriers;   Symptom Management: Take medications as prescribed   Attend all scheduled provider appointments Call provider office for new concerns or questions  call  the Suicide and Crisis Lifeline: 988 call the [place] National Suicide Prevention Lifeline: 929-833-1995 or TTY: 7197407743 TTY (270)885-7384) to talk to a trained counselor call 1-800-273-TALK (toll free, 24 hour hotline) if experiencing a Mental Health or [place] Crisis   Follow Up Plan: Telephone follow up appointment with care management team member scheduled for: 06-20-2022 at 16 am       CCM Expected Outcome:  Monitor, Self-Manage and Reduce Symptoms of: Falls prevention and safety       Current Barriers:  Care Coordination needs related to falls prevention and safety in a patient with  high fall risk in a patient with recent falls with injury 2 weeks ago Chronic Disease Management support and education needs related to falls and safety prevention  Planned Interventions: Provided written and verbal education re: potential causes of falls and Fall prevention strategies Reviewed medications and discussed potential side effects of medications such as dizziness and frequent urination Advised patient of importance of notifying provider of falls. Education on calling the office for new falls or safety concerns Assessed for signs and symptoms of orthostatic hypotension Assessed for falls since last encounter. Last fall was in October.  The patient denies any new falls. Review of fall and safety prevention. The patient has had broken ribs from the fall and when she had pneumonia in December/January she feels this has aggravated the area. She is having a CT tomorrow to evaluate for any new areas of concern with her ribs.  Assessed patients knowledge of fall risk prevention secondary to previously provided education. The patient is doing better. Still has weakness but feels stronger than she did. She continues to see her providers as schedule and is having a good day today. Education and support given.  Advised patient to discuss new falls when they occur, changes in conditions that create  increased risk for falls and safety concerns with provider Screening for signs and symptoms of depression related to chronic disease state Assessed social determinant of health barriers  Symptom Management: Take medications as prescribed   Attend all scheduled provider appointments Call provider office for new concerns or questions  Work with the social worker to address care coordination needs and will continue to work with the clinical team to address health care and disease management related needs call the Suicide and Crisis Lifeline: 988 call the [place] National Suicide Prevention Lifeline: 859 357 2988 or TTY: 321-032-2359 TTY (626)552-4186) to talk to a trained counselor call 1-800-273-TALK (toll free, 24 hour hotline) if experiencing a Mental Health or [place]   Follow Up Plan: Telephone follow up appointment with care management team member scheduled for: 06-20-2022 at 1030 am          Plan:Telephone follow up appointment with care management team member scheduled for:  06-20-2022 at 28 am  Noreene Larsson RN, MSN, CCM RN Care Manager  Chronic Care Management Direct Number: [phone]

## 2022-05-02 NOTE — Patient Instructions (Signed)
Medication Instructions:  No changes  If you need a refill on your cardiac medications before your next appointment, please call your pharmacy.   Lab work: No new labs needed  Testing/Procedures: No new testing needed  Follow-Up: At [facility], you and your health needs are our priority.  As part of our continuing mission to provide you with exceptional heart care, we have created designated Provider Care Teams.  These Care Teams include your primary Cardiologist (physician) and Advanced Practice Providers (APPs -  Physician Assistants and Nurse Practitioners) who all work together to provide you with the care you need, when you need it.  You will need a follow up appointment in 12 months  Providers on your designated Care Team:   Christopher Berge, NP Ryan Dunn, PA-C Cadence Furth, PA-C  COVID-19 Vaccine Information can be found at: [email or web address] For questions related to vaccine distribution or appointments, please email [email or web address] or call [phone].   

## 2022-05-02 NOTE — Patient Instructions (Signed)
Please call the care guide team at [phone] if you need to cancel or reschedule your appointment.   If you are experiencing a Mental Health or [place] or need someone to talk to, please call the Suicide and Crisis Lifeline: 988 call the [place] National Suicide Prevention Lifeline: 281-214-2728 or TTY: 539-597-8432 TTY (303) 618-9989) to talk to a trained counselor call 1-800-273-TALK (toll free, 24 hour hotline)   Following is a copy of the CCM Program Consent:  CCM service includes personalized support from designated clinical staff supervised by the physician, including individualized plan of care and coordination with other care providers 24/7 contact phone numbers for assistance for urgent and routine care needs. Service will only be billed when office clinical staff spend 20 minutes or more in a month to coordinate care. Only one practitioner may furnish and bill the service in a calendar month. The patient may stop CCM services at amy time (effective at the end of the month) by phone call to the office staff. The patient will be responsible for cost sharing (co-pay) or up to 20% of the service fee (after annual deductible is met)  Following is a copy of your full provider care plan:   Goals Addressed             This Visit's Progress    CCM (DIABETES) EXPECTED OUTCOME: MONITOR, SELF-MANAGE AND REDUCE SYMPTOMS OF DIABETES       Current Barriers:  Knowledge Deficits related to how to effectively manage and maintain DM and goal of A1C <8% Chronic Disease Management support and education needs related to effective management DM Goal set by the provider <8.1% Lab Results  Component Value Date   HGBA1C 7.4 (H) 03/16/2022     Planned Interventions: Provided education to patient about basic DM disease process. Education provided; Reviewed medications with patient and discussed importance of medication adherence. The patient is compliant with medications. She is now  taking Mounjarou 12.5mg  and is doing well with the Mounjarou. She is no longer having to take insulin. She is pleased with her medications.  Education and support given;        Reviewed prescribed diet with patient heart healthy/ADA diet. The patient is doing well and says that she has lost weight. She is eating but not snacking like she was. She will drink boost every now and then to help with protein intake. Education and support given.  Counseled on importance of regular laboratory monitoring as prescribed. Labs are up to date. Denies any acute findings. A1C is stable but trending down.;        Discussed plans with patient for ongoing care management follow up and provided patient with direct contact information for care management team;      Provided patient with written educational materials related to hypo and hyperglycemia and importance of correct treatment. Review of how to effectively manage drops in blood sugars or highs. The patient states the [facility] is really helpful and alerts her when her blood sugars is high or low. She denies any highs or lows. The highest she has seen is 242 and she knew how to correct it. She likes having the freestyle libre for monitoring of her blood sugars;       Reviewed scheduled/upcoming provider appointments including: 05-18-2022;         Advised patient, providing education and rationale, to check cbg when you have symptoms of low or high blood sugar and has freestyle libre  and record. States her  blood sugars this am was 111. She says they are staying in range well. Will continue to monitor.  call provider for findings outside established parameters;       Review of patient status, including review of consultants reports, relevant laboratory and other test results, and medications completed;       Advised patient to discuss changes in DM and diabetes health with provider;      Screening for signs and symptoms of depression related to chronic disease  state;        Assessed social determinant of health barriers;         Symptom Management: Take medications as prescribed   Attend all scheduled provider appointments Call pharmacy for medication refills 3-7 days in advance of running out of medications Call provider office for new concerns or questions  call the Suicide and Crisis Lifeline: 988 call the [place] National Suicide Prevention Lifeline: 6238292207 or TTY: 985-511-0159 TTY (319) 778-8862) to talk to a trained counselor call 1-800-273-TALK (toll free, 24 hour hotline) if experiencing a Mental Health or [place]  check feet daily for cuts, sores or redness trim toenails straight across manage portion size wash and dry feet carefully every day wear comfortable, cotton socks wear comfortable, well-fitting shoes  Follow Up Plan: Telephone follow up appointment with care management team member scheduled for: 06-20-2022 at 66 am       CCM Expected Outcome:  Monitor, Self-Manage and Reduce Symptoms of CAD       Current Barriers:  Chronic Disease Management support and education needs related to effective management of CAD BP Readings from Last 3 Encounters:  02/25/22 (!) 111/45  02/16/22 111/77  02/14/22 111/63     Planned Interventions: Assessed understanding of CAD diagnosis. The patient states that she is much better and having a good day today. Saw the cardiologist recently and got a good report. Denies any new concerns with CAD or heart health.  Medications reviewed including medications utilized in CAD treatment plan. Is compliant with medications. Denies any new concerns with medications.  Provided education on importance of blood pressure control in management of CAD. Her blood pressures are stable. She denies any acute findings with changes in her blood pressures. ; Provided education on Importance of limiting foods high in cholesterol. Has lost weight, eats healthy, does not eat between  meals.; Counseled on importance of regular laboratory monitoring as prescribed. The patient has regular lab work.; Reviewed Importance of taking all medications as prescribed. Takes medications as prescribed.  Reviewed Importance of attending all scheduled provider appointments. Next appointment with pcp is 05-18-2022- reminder given today.  Advised to report any changes in symptoms or exercise tolerance Advised patient to discuss changes in CAD or heart health with provider; Screening for signs and symptoms of depression related to chronic disease state;  Assessed social determinant of health barriers;   Symptom Management: Take medications as prescribed   Attend all scheduled provider appointments Call provider office for new concerns or questions  call the Suicide and Crisis Lifeline: 988 call the [place] National Suicide Prevention Lifeline: 336-151-1520 or TTY: 773-282-2534 TTY 931-759-4537) to talk to a trained counselor call 1-800-273-TALK (toll free, 24 hour hotline) if experiencing a Mental Health or [place] Crisis   Follow Up Plan: Telephone follow up appointment with care management team member scheduled for: 06-20-2022 at 1 am       CCM Expected Outcome:  Monitor, Self-Manage and Reduce Symptoms of: Falls prevention and safety  Current Barriers:  Care Coordination needs related to falls prevention and safety in a patient with  high fall risk in a patient with recent falls with injury 2 weeks ago Chronic Disease Management support and education needs related to falls and safety prevention  Planned Interventions: Provided written and verbal education re: potential causes of falls and Fall prevention strategies Reviewed medications and discussed potential side effects of medications such as dizziness and frequent urination Advised patient of importance of notifying provider of falls. Education on calling the office for new falls or safety concerns Assessed for  signs and symptoms of orthostatic hypotension Assessed for falls since last encounter. Last fall was in October. The patient denies any new falls. Review of fall and safety prevention. The patient has had broken ribs from the fall and when she had pneumonia in December/January she feels this has aggravated the area. She is having a CT tomorrow to evaluate for any new areas of concern with her ribs.  Assessed patients knowledge of fall risk prevention secondary to previously provided education. The patient is doing better. Still has weakness but feels stronger than she did. She continues to see her providers as schedule and is having a good day today. Education and support given.  Advised patient to discuss new falls when they occur, changes in conditions that create increased risk for falls and safety concerns with provider Screening for signs and symptoms of depression related to chronic disease state Assessed social determinant of health barriers  Symptom Management: Take medications as prescribed   Attend all scheduled provider appointments Call provider office for new concerns or questions  Work with the social worker to address care coordination needs and will continue to work with the clinical team to address health care and disease management related needs call the Suicide and Crisis Lifeline: 988 call the [place] National Suicide Prevention Lifeline: 939-189-6727 or TTY: (571)792-2304 TTY 506-608-6290) to talk to a trained counselor call 1-800-273-TALK (toll free, 24 hour hotline) if experiencing a Mental Health or [place]   Follow Up Plan: Telephone follow up appointment with care management team member scheduled for: 06-20-2022 at 1030 am          Patient verbalizes understanding of instructions and care plan provided today and agrees to view in [place]. Active MyChart status and patient understanding of how to access instructions and care plan via MyChart confirmed  with patient.     Telephone follow up appointment with care management team member scheduled for: 06-19-2021 at 41 am

## 2022-05-03 ENCOUNTER — Ambulatory Visit
Admission: RE | Admit: 2022-05-03 | Discharge: 2022-05-03 | Disposition: A | Discharge status: Home / Self Care | Payer: Self-pay | Source: Ambulatory Visit | Attending: Surgery | Admitting: Surgery

## 2022-05-03 ENCOUNTER — Other Ambulatory Visit: Payer: Self-pay | Admitting: Surgery

## 2022-05-03 DIAGNOSIS — R0789 Other chest pain: Secondary | ICD-10-CM

## 2022-05-03 DIAGNOSIS — M79622 Pain in left upper arm: Secondary | ICD-10-CM

## 2022-05-05 ENCOUNTER — Telehealth: Payer: Self-pay

## 2022-05-05 NOTE — Telephone Encounter (Signed)
Notified patient as instructed, patient pleased. Discussed follow-up appointments, patient agrees  

## 2022-05-05 NOTE — Telephone Encounter (Signed)
-----   Message from Jules Husbands, MD sent at 05/05/2022 10:12 AM EST ----- Please let her know CT showed rib fx ( old) but no new acute issues ----- Message ----- From: Interface, Rad Results In Sent: 05/04/2022   9:38 PM EST To: Jules Husbands, MD

## 2022-05-07 DIAGNOSIS — J439 Emphysema, unspecified: Secondary | ICD-10-CM | POA: Diagnosis not present

## 2022-05-07 DIAGNOSIS — J09X1 Influenza due to identified novel influenza A virus with pneumonia: Secondary | ICD-10-CM | POA: Diagnosis not present

## 2022-05-07 DIAGNOSIS — G9341 Metabolic encephalopathy: Secondary | ICD-10-CM | POA: Diagnosis not present

## 2022-05-07 DIAGNOSIS — J9601 Acute respiratory failure with hypoxia: Secondary | ICD-10-CM | POA: Diagnosis not present

## 2022-05-07 DIAGNOSIS — E1151 Type 2 diabetes mellitus with diabetic peripheral angiopathy without gangrene: Secondary | ICD-10-CM | POA: Diagnosis not present

## 2022-05-07 DIAGNOSIS — E1122 Type 2 diabetes mellitus with diabetic chronic kidney disease: Secondary | ICD-10-CM | POA: Diagnosis not present

## 2022-05-07 DIAGNOSIS — R69 Illness, unspecified: Secondary | ICD-10-CM

## 2022-05-07 HISTORY — DX: Illness, unspecified: R69

## 2022-05-08 ENCOUNTER — Ambulatory Visit: Payer: Medicare Other

## 2022-05-08 NOTE — Patient Outreach (Signed)
Care Management & Coordination Services Pharmacy Note  05/08/2022 Name:  Tammy [name] MRN:  [id number] DOB:  21-Jan-1946  Summary: -77 year old female presents for f/u CCM visit. She enjoys traveling with her spouse but is unable to do so currently due to pain. Her favorite trip she's been on was [place] but she also enjoyed her trip to [place]. She also enjoyed going water skiing, her and her husband have a [place] the [place] in [place].  -Patient became frustrated on 05/08/22 during our visit. She kept asking me to hurry up. I asked if she forgot about the visit and she told me she didn't forget but that she was busy. I tried asking her questions but she didn't have her meds with her, spoke the wrong medications, couldn't find her sugar readings, and finally said, "Let's hurry this up." I told her that's fine but I had one quick question (Eliquis dosing) and after she answered I told her I'd look into the dosing and that I'd reschedule but to please, in the future, reschedule if she is too busy to talk. She immediately raised her voice and I asked her to stop yelling. She yelled back saying, "I'm not yelling." I hung up the phone  Recommendations/Changes made from today's visit: -Recommend ACE/ARB for kidney function. Was on Losartan before but unsure why Dc'd. Will co-sign PCP to note to ask -Patient states she is still supposed to take Tresiba 20 units PRN but is no longer on medslist. Will co-sign PCP and ask how to proceed   Subjective: Tammy [name] is an 77 y.o. year old female who is a primary patient of [name], DO.  The care coordination team was consulted for assistance with disease management and care coordination needs.    Engaged with patient by telephone for follow up visit.  Recent office visits:  12/06/21 [name], DO (Chest pain) Orders: Labs, ambulatory referral to Cardiothoracic surgery- Medication changes: Patient reported not taking ciprofloxacin, budeson-glycopyrrol,  prednisone and bisoprolol   Recent consult visits:  None since last coordination call with CCM RN on 11/23/21   Hospital visits:  None since last coordination call with CCM RN on 11/23/21   Objective:  Lab Results  Component Value Date   CREATININE 1.25 (H) 03/16/2022   BUN 20 03/16/2022   EGFR 44 (L) 03/16/2022   GFRNONAA 49 (L) 03/10/2022   GFRAA 32 (L) 02/10/2020   NA 142 03/16/2022   K 4.7 03/16/2022   CALCIUM 9.2 03/16/2022   CO2 25 03/16/2022   GLUCOSE 85 03/16/2022    Lab Results  Component Value Date/Time   HGBA1C 7.4 (H) 03/16/2022 01:23 PM   HGBA1C 7.5 11/23/2021 12:00 AM   HGBA1C 8.5 (H) 09/08/2021 09:22 AM   HGBA1C 8.7 01/31/2016 12:00 AM   MICROALBUR 150 (H) 08/19/2020 10:11 AM   MICROALBUR 150 (H) 01/02/2019 10:30 AM    Last diabetic Eye exam:  Lab Results  Component Value Date/Time   HMDIABEYEEXA No Retinopathy 11/09/2020 12:00 AM    Last diabetic Foot exam: No results found for: "HMDIABFOOTEX"   Lab Results  Component Value Date   CHOL 136 03/16/2022   HDL 46 03/16/2022   LDLCALC 61 03/16/2022   TRIG 175 (H) 03/16/2022   CHOLHDL 3.2 09/07/2015       Latest Ref Rng & Units 03/16/2022    1:27 PM 03/10/2022    9:30 AM 03/09/2022   10:39 AM  Hepatic Function  Total Protein 6.0 -  8.5 g/dL 6.4  5.6  5.7   Albumin 3.8 - 4.8 g/dL 3.8  2.4  2.3   AST 0 - 40 IU/L '29  26  27   '$ ALT 0 - 32 IU/L '22  14  17   '$ Alk Phosphatase 44 - 121 IU/L 84  61  67   Total Bilirubin 0.0 - 1.2 mg/dL 0.3  0.4  0.4     Lab Results  Component Value Date/Time   TSH 0.812 03/16/2022 01:27 PM   TSH 0.63 11/23/2021 12:00 AM   TSH 0.510 03/23/2021 11:27 AM       Latest Ref Rng & Units 03/16/2022    1:27 PM 03/10/2022    9:30 AM 03/09/2022   10:39 AM  CBC  WBC 3.4 - 10.8 x10E3/uL 9.3  6.1  8.0   Hemoglobin 11.1 - 15.9 g/dL 13.7  12.2  12.4   Hematocrit 34.0 - 46.6 % 42.9  38.9  40.6   Platelets 150 - 450 x10E3/uL 412  219  222     Lab Results  Component Value  Date/Time   VD25OH 45 11/23/2021 12:00 AM   VD25OH 32.6 04/08/2019 04:38 PM   VITAMINB12 427 03/26/2017 04:32 PM    Clinical ASCVD: Yes  The 10-year ASCVD risk score (Arnett DK, et al., 2019) is: 37.3%   Values used to calculate the score:     Age: 78 years     Sex: Female     Is Non-Hispanic African American: No     Diabetic: Yes     Tobacco smoker: No     Systolic Blood Pressure: A999333 mmHg     Is BP treated: Yes     HDL Cholesterol: 46 mg/dL     Total Cholesterol: 136 mg/dL    Other: (CHADS2VASc if Afib, MMRC or CAT for COPD, ACT, DEXA)     03/16/2022    1:29 PM 01/03/2022    4:44 PM 12/26/2021   12:35 PM  Depression screen PHQ 2/9  Decreased Interest 0 0 0  Down, Depressed, Hopeless 1 0 0  PHQ - 2 Score 1 0 0  Altered sleeping 0 0 0  Tired, decreased energy 1 0 0  Change in appetite 0 0 0  Feeling bad or failure about yourself  0 0 0  Trouble concentrating 0 0 0  Moving slowly or fidgety/restless 0 0 0  Suicidal thoughts 0 0 0  PHQ-9 Score 2 0 0  Difficult doing work/chores Not difficult at all  Not difficult at all     Social History   Tobacco Use  Smoking Status Former   Packs/day: 0.50   Years: 15.00   Total pack years: 7.50   Types: Cigarettes   Quit date: 03/14/2007   Years since quitting: 15.1  Smokeless Tobacco Never   BP Readings from Last 3 Encounters:  05/02/22 120/60  04/24/22 (!) 132/50  04/03/22 127/70   Pulse Readings from Last 3 Encounters:  05/02/22 90  04/24/22 (!) 47  03/16/22 65   Wt Readings from Last 3 Encounters:  05/02/22 200 lb (90.7 kg)  03/07/22 207 lb 3.7 oz (94 kg)  02/24/22 208 lb 15.9 oz (94.8 kg)   BMI Readings from Last 3 Encounters:  05/02/22 32.28 kg/m  04/24/22 33.45 kg/m  03/07/22 33.45 kg/m    Allergies  Allergen Reactions   Hydrocodone Other (See Comments)    Unresponsive   Aleve [Naproxen Sodium]     Hives & itching  Contrast Media [Iodinated Contrast Media]     Pt avoids due to kidney issues    Ioxaglate Other (See Comments)    (iodine) Pt avoids due to kidney issues    Oxycodone Other (See Comments)    hallucinations   Ranexa [Ranolazine]     Sick on stomach   Sulfa Antibiotics Other (See Comments)    Pt avoids due to kidney issues   Tramadol Other (See Comments)    nightmares Other reaction(s): Hallucination, Other (See Comments) nightmares     Medications Reviewed Today     Reviewed by Michel Santee, CMA (Certified Medical Assistant) on 05/02/22 at 1413  Med List Status: <None>   Medication Order Taking? Sig Documenting Provider Last Dose Status Informant  acetaminophen (TYLENOL) 325 MG tablet [id number]  Take 2 tablets (650 mg total) by mouth every 6 (six) hours as needed for mild pain (or Fever >/= 101). Sheikh, Omair Latif, DO  Active   albuterol (PROVENTIL) (2.5 MG/3ML) 0.083% nebulizer solution [id number]  Take 3 mLs (2.5 mg total) by nebulization every 6 (six) hours as needed for wheezing or shortness of breath. Chesley Mires, MD  Active Multiple Informants, Pharmacy Records, Other  albuterol (VENTOLIN HFA) 108 (90 Base) MCG/ACT inhaler [id number]  Inhale 2 puffs into the lungs every 6 (six) hours as needed for wheezing or shortness of breath. Johnson, Megan P, DO  Active   anastrozole (ARIMIDEX) 1 MG tablet [id number]  TAKE 1 TABLET BY MOUTH DAILY Cammie Sickle, MD  Active Multiple Informants, Pharmacy Records, Other  apixaban (ELIQUIS) 2.5 MG TABS tablet [id number]  Take 1 tablet (2.5 mg total) by mouth 2 (two) times daily. Johnson, Megan P, DO  Active   benzonatate (TESSALON) 200 MG capsule [id number]  Take 1 capsule (200 mg total) by mouth 2 (two) times daily as needed for cough. [name], DO  Active Multiple Informants, Pharmacy Records, Other  Calcium Carb-Cholecalciferol (CALCIUM 600 + D) 600-200 MG-UNIT TABS [id number]  Take 1 tablet by mouth daily.  [provider]  Active Other, Pharmacy Records, Multiple Informants  cholecalciferol  (VITAMIN D3) 25 MCG (1000 UT) tablet [id number]  Take 1,000 Units by mouth daily. [provider]  Active Other, Pharmacy Records, Multiple Informants  CRANBERRY PO [id number]  Take 2 capsules by mouth daily. [provider]  Active Other, Pharmacy Records, Multiple Informants  DULoxetine (CYMBALTA) 60 MG capsule [id number]  Take 2 capsules (120 mg total) by mouth daily. Johnson, Megan P, DO  Active   fluticasone (FLONASE) 50 MCG/ACT nasal spray [id number]  Place 1 spray into both nostrils daily. Chesley Mires, MD  Active Multiple Informants, Pharmacy Records, Other  Fluticasone-Umeclidin-Vilant (TRELEGY ELLIPTA) 100-62.5-25 MCG/ACT AEPB [id number]  Inhale 1 puff into the lungs daily. Johnson, Megan P, DO  Active   gabapentin (NEURONTIN) 400 MG capsule [id number]  TAKE 1 CAPSULE BY MOUTH THREE TIMES DAILY Park Liter P, DO  Active   glipiZIDE (GLUCOTROL) 5 MG tablet [id number]  TAKE 1 TABLET BY MOUTH DAILY BEFORE BREAKFAST Johnson, Megan P, DO  Active Multiple Informants, Pharmacy Records, Other  isosorbide mononitrate (IMDUR) 60 MG 24 hr tablet [id number]  TAKE ONE TABLET BY MOUTH AT BEDTIME [name], DO  Active Multiple Informants, Pharmacy Records, Other  Lactobacillus (PROBIOTIC ACIDOPHILUS PO) [id number]  Take 1 capsule by mouth daily at 12 noon. [provider]  Active Other, Pharmacy Records, Multiple Informants  metoprolol tartrate (LOPRESSOR) 25 MG tablet [id number]  Take 1 tablet (25 mg total) by  mouth 2 (two) times daily. Johnson, Megan P, DO  Active   montelukast (SINGULAIR) 10 MG tablet [id number]  Take 1 tablet (10 mg total) by mouth at bedtime. [name], DO  Active Multiple Informants, Pharmacy Records, Other  MYRBETRIQ 50 MG TB24 tablet [id number]  TAKE 1 TABLET BY MOUTH DAILY Vaillancourt, Samantha, PA-C  Active Other, Pharmacy Records, Multiple Informants  nitroGLYCERIN (NITROSTAT) 0.4 MG SL tablet [id number]  Place 1 tablet (0.4 mg total) under the  tongue every 5 (five) minutes as needed for chest pain. Total of 3 doses [name], MD  Active Multiple Informants, Pharmacy Records, Other  ondansetron (ZOFRAN) 4 MG tablet [id number]  Take 1 tablet (4 mg total) by mouth every 6 (six) hours as needed for nausea. [address], [place], [place]  Active   oxyCODONE-acetaminophen (PERCOCET/ROXICET) 5-325 MG tablet [id number]  TAKE 1/2 TO 1 TABLET BY MOUTH EVERY 8 HOURS AS NEEDED FOR SEVERE PAIN [name], DO  Active Multiple Informants, Pharmacy Records, Other  pantoprazole (PROTONIX) 40 MG tablet [id number]  TAKE 1 TABLET BY MOUTH DAILY Johnson, Megan P, DO  Active Multiple Informants, Pharmacy Records, Other  Pumpkin Seed-Soy Germ (AZO BLADDER CONTROL/GO-LESS PO) [id number]  Take 1 tablet by mouth in the morning and at bedtime. [provider]  Active Other, Pharmacy Records, Multiple Informants  rosuvastatin (CRESTOR) 40 MG tablet [id number]  Take 1 tablet (40 mg total) by mouth daily. [name], DO  Active Multiple Informants, Pharmacy Records, Other  SYNTHROID 150 MCG tablet [id number]  Take 1 tablet (150 mcg total) by mouth daily before breakfast. Park Liter P, DO  Active   tirzepatide [facility]) 12.5 MG/0.5ML Pen [id number]  Inject 12.5 mg into the skin once a week. Wynetta Emery, Megan P, DO  Active   trimethoprim (TRIMPEX) 100 MG tablet [id number]  TAKE ONE TABLET BY MOUTH EVERY DAY Jon Billings, NP  Active Multiple Informants, Pharmacy Records, Other  UNABLE TO Lorayne Marek [id number]  Med Name: Energy, memory and mood enhancer [provider]  Active Other, Pharmacy Records, Multiple Informants  ZINC CITRATE PO [id number]  Take 1 tablet by mouth daily. [provider]  Active Other, Pharmacy Records, Multiple Informants            SDOH:  (Social Determinants of Health) assessments and interventions performed: Yes SDOH Interventions    [place] Coordination from 05/08/2022 in [place]  Telephone from 03/14/2022 in [place] Coordination Telephone from 02/28/2022 in [place] from 12/26/2021 in [place] Coordination from 11/23/2021 in [place] Management from 10/24/2021 in [place]  SDOH Interventions        Food Insecurity Interventions -- Intervention Not Indicated Intervention Not Indicated Intervention Not Indicated -- --  Housing Interventions -- Intervention Not Indicated Intervention Not Indicated Intervention Not Indicated Intervention Not Indicated --  Transportation Interventions Intervention Not Indicated Intervention Not Indicated Intervention Not Indicated Intervention Not Indicated Intervention Not Indicated Intervention Not Indicated  Utilities Interventions -- -- -- Intervention Not Indicated Intervention Not Indicated --  Alcohol Usage Interventions -- -- -- Intervention Not Indicated (Score <7) -- --  Financial Strain Interventions Intervention Not Indicated -- -- Intervention Not Indicated -- Intervention Not Indicated  Physical Activity Interventions -- -- -- Intervention Not Indicated -- --  Stress Interventions -- -- -- Intervention Not Indicated -- --  Medication Assistance: None required.  Patient affirms current coverage meets needs.  Name and location of Current pharmacy:  [place], [place] - [address] [place] [address] Phone: [phone] Fax: [place], [place] [address] Phone: [phone] Fax: [phone]  [id number] [place] [address] [place]Phone: [phone]: [id number]: [phone]: [phone]  Compliance/Adherence/Medication fill history: Care Gaps: Colonoscopy-01/28/19 Diabetic Foot Exam-08/19/20 Mammogram-07/27/21 Ophthalmology-11/09/20 Dexa Scan - NA Annual Well Visit - 12/20/20 (Medicare) Micro albumin-NA Hemoglobin A1c- 11/23/21   Star Rating Drugs: Rosuvastatin 40 mg-last fill 09/15/21 90 ds, 06/17/21 90 ds   Assessment/Plan  DVT Hx (Goal: Prevent DVT's) -Controlled -Current treatment  Eliquis 2.'5mg'$  BID Appropriate, Effective, Safe, Accessible -Medications previously tried: N/A  -Recommended to continue current medication   Hypertension (BP goal <140/90) BP Readings from Last 3 Encounters:  05/02/22 120/60  04/24/22 (!) 132/50  04/03/22 127/70  -Controlled -Current treatment: Metoprolol Tart '25mg'$  BID Appropriate, Effective, Safe, Accessible -Medications previously tried: Bisprolol, Losartan -Current home readings:  August 2023 Patient was laying on couch and didn't want to get up -Current dietary habits: "Tries to eat healthy" -Current exercise habits: None -Denies hypotensive/hypertensive symptoms -Educated on BP goals and benefits of medications for prevention of heart attack, stroke and kidney damage; -Counseled to monitor BP at home weekly, document, and provide log at future appointments August 2023: [facility] has patient on Losartan butisn't on Epic. Unable to see when Dc'd. Is indicated for kidney function but could have been Dc'd due to potassium. Will ask PCP Feb 2024; Will ask PCP again about Losartan   Diabetes (A1c goal <8%) Lab Results  Component Value Date   HGBA1C 7.4 (H) 03/16/2022   HGBA1C 7.5 11/23/2021   HGBA1C 8.5 (H) 09/08/2021   Lab Results  Component Value Date   MICROALBUR 150 (H) 08/19/2020   LDLCALC 61 03/16/2022   CREATININE 1.25 (H) 03/16/2022    Lab Results  Component Value Date   NA 142 03/16/2022   K 4.7 03/16/2022   CREATININE 1.25 (H)  03/16/2022   EGFR 44 (L) 03/16/2022   GFRNONAA 49 (L) 03/10/2022   GLUCOSE 85 03/16/2022    Lab Results  Component Value Date   WBC 9.3 03/16/2022   HGB 13.7 03/16/2022   HCT 42.9 03/16/2022   MCV 89 03/16/2022   PLT 412 03/16/2022    Lab Results  Component Value Date   LABMICR See below: 09/08/2021   LABMICR See below: 06/30/2021   MICROALBUR 150 (H) 08/19/2020   MICROALBUR 150 (H) 01/02/2019   -Not ideally controlled -GFRs stable in low 30s last jan dec nov - no change once off Jardiance -Current medications: Mounjaro 12.'5mg'$  weekly Appropriate, Effective, Safe, Accessible Tresiba 30 units daily Query Appropriate,  No longer on medslist but patient still taking per patient Glipizide 5 mg once daily Appropriate, Query effective,  -Previous meds: metformin (GFR), Jardiance 25 mg daily (held dec-jan 2023, urology advised stopping d/t recurring UTI following pcp visit), Trulicity -Current home glucose readings fasting glucose:  August 2023: typically 160-170, reports previous missed dose of tresiba leading to reading in upper 300s was very rare.  Feb 2024: Patient has LElenor [place] doesn't have hooked up  to our system -Denies hypoglycemic/hyperglycemic symptoms. Only one episode that she can recall, has  -Current meal patterns: tries to be consistent with meals, avoids sweets. Husband often brining meals home.   -Current exercise: limited due to back/knee pain - leg still hurt after surgery. Getting in pool for water walking would likely be better on knee but fall risk is a concern  -Educated on A1c and blood sugar goals; -Counseled to check feet daily and get yearly eye exams August 2023: Patient wasn't interested in getting up from the couch and getting her medications due to pain. Because of this, unable to talk about medications (She wasn't sure of what she was/wasn't taking). Recommend DC Glipizide and letting insulin take over Feb 2024: Recommend ACE/ARB   Hyperlipidemia:  (LDL goal < 70) The 10-year ASCVD risk score (Arnett DK, et al., 2019) is: 37.3%   Values used to calculate the score:     Age: 14 years     Sex: Female     Is Non-Hispanic African American: No     Diabetic: Yes     Tobacco smoker: No     Systolic Blood Pressure: A999333 mmHg     Is BP treated: Yes     HDL Cholesterol: 46 mg/dL     Total Cholesterol: 136 mg/dL Lab Results  Component Value Date   CHOL 136 03/16/2022   CHOL 126 11/23/2021   CHOL 137 09/08/2021   Lab Results  Component Value Date   HDL 46 03/16/2022   HDL 45 11/23/2021   HDL 47 09/08/2021   Lab Results  Component Value Date   LDLCALC 61 03/16/2022   LDLCALC 54 11/23/2021   LDLCALC 56 09/08/2021   Lab Results  Component Value Date   TRIG 175 (H) 03/16/2022   TRIG 203 (A) 11/23/2021   TRIG 208 (H) 09/08/2021   Lab Results  Component Value Date   CHOLHDL 3.2 09/07/2015   CHOLHDL 3.4 12/17/2014   CHOLHDL 4.2 12/31/2013   No results found for: "LDLDIRECT" Last vitamin D Lab Results  Component Value Date   VD25OH 45 11/23/2021   Lab Results  Component Value Date   TSH 0.812 03/16/2022   -Controlled -Current treatment: Rosuvastatin '40mg'$  Appropriate, Effective, Safe, Accessible -Medications previously tried: n/a  -Current dietary patterns: "Tries to eat healthy" -Current exercise habits: none -Educated on Cholesterol goals;  -Recommended to continue current medication     COPD (Goal: control symptoms and prevent exacerbations) -Controlled -Current treatment  Trelegy Appropriate, Effective, Safe, Accessible Proair as needed Appropriate, Effective, Safe, Accessible Montele[place]ast Appropriate, Effective, Safe, Accessible -Gold Grade: Gold 2 (FEV1 50-79%) -Current COPD Classification:  A (low sx, <2 exacerbations/yr) -MMRC/CAT score: 9 (oct, 2022)     No data to display        Pulmonary Functions Testing Results:  No results found for: "FEV1", "FVC", "FEV1FVC", "TLC", "DLCO"  -Pulmonary  function testing: 2021 -Exacerbations requiring treatment in last 6 months: 0 -Patient denies consistent use of maintenance inhaler -Frequency of rescue inhaler use: 1-3x/wk -Counseled on Proper inhaler technique; When to use rescue inhaler -Recommended to continue current medication  CPP F/U December 2024  [place], [place].D. - [phone]

## 2022-05-10 ENCOUNTER — Ambulatory Visit: Payer: Medicare Other | Admitting: Surgery

## 2022-05-11 DIAGNOSIS — Z794 Long term (current) use of insulin: Secondary | ICD-10-CM

## 2022-05-11 DIAGNOSIS — I2510 Atherosclerotic heart disease of native coronary artery without angina pectoris: Secondary | ICD-10-CM | POA: Diagnosis not present

## 2022-05-11 DIAGNOSIS — E1165 Type 2 diabetes mellitus with hyperglycemia: Secondary | ICD-10-CM | POA: Diagnosis not present

## 2022-05-12 DIAGNOSIS — Z7951 Long term (current) use of inhaled steroids: Secondary | ICD-10-CM | POA: Diagnosis not present

## 2022-05-12 DIAGNOSIS — Z79891 Long term (current) use of opiate analgesic: Secondary | ICD-10-CM | POA: Diagnosis not present

## 2022-05-12 DIAGNOSIS — Z8701 Personal history of pneumonia (recurrent): Secondary | ICD-10-CM | POA: Diagnosis not present

## 2022-05-12 DIAGNOSIS — J9601 Acute respiratory failure with hypoxia: Secondary | ICD-10-CM | POA: Diagnosis not present

## 2022-05-12 DIAGNOSIS — J439 Emphysema, unspecified: Secondary | ICD-10-CM | POA: Diagnosis not present

## 2022-05-12 DIAGNOSIS — G8929 Other chronic pain: Secondary | ICD-10-CM | POA: Diagnosis not present

## 2022-05-12 DIAGNOSIS — I2510 Atherosclerotic heart disease of native coronary artery without angina pectoris: Secondary | ICD-10-CM | POA: Diagnosis not present

## 2022-05-12 DIAGNOSIS — Z6833 Body mass index (BMI) 33.0-33.9, adult: Secondary | ICD-10-CM | POA: Diagnosis not present

## 2022-05-12 DIAGNOSIS — Z794 Long term (current) use of insulin: Secondary | ICD-10-CM | POA: Diagnosis not present

## 2022-05-12 DIAGNOSIS — E875 Hyperkalemia: Secondary | ICD-10-CM | POA: Diagnosis not present

## 2022-05-12 DIAGNOSIS — E1122 Type 2 diabetes mellitus with diabetic chronic kidney disease: Secondary | ICD-10-CM | POA: Diagnosis not present

## 2022-05-12 DIAGNOSIS — E8809 Other disorders of plasma-protein metabolism, not elsewhere classified: Secondary | ICD-10-CM | POA: Diagnosis not present

## 2022-05-12 DIAGNOSIS — E669 Obesity, unspecified: Secondary | ICD-10-CM | POA: Diagnosis not present

## 2022-05-12 DIAGNOSIS — E1151 Type 2 diabetes mellitus with diabetic peripheral angiopathy without gangrene: Secondary | ICD-10-CM | POA: Diagnosis not present

## 2022-05-12 DIAGNOSIS — K760 Fatty (change of) liver, not elsewhere classified: Secondary | ICD-10-CM | POA: Diagnosis not present

## 2022-05-12 DIAGNOSIS — N1832 Chronic kidney disease, stage 3b: Secondary | ICD-10-CM | POA: Diagnosis not present

## 2022-05-12 DIAGNOSIS — G9341 Metabolic encephalopathy: Secondary | ICD-10-CM | POA: Diagnosis not present

## 2022-05-12 DIAGNOSIS — C50919 Malignant neoplasm of unspecified site of unspecified female breast: Secondary | ICD-10-CM | POA: Diagnosis not present

## 2022-05-12 DIAGNOSIS — Z7901 Long term (current) use of anticoagulants: Secondary | ICD-10-CM | POA: Diagnosis not present

## 2022-05-12 DIAGNOSIS — K5790 Diverticulosis of intestine, part unspecified, without perforation or abscess without bleeding: Secondary | ICD-10-CM | POA: Diagnosis not present

## 2022-05-12 DIAGNOSIS — I129 Hypertensive chronic kidney disease with stage 1 through stage 4 chronic kidney disease, or unspecified chronic kidney disease: Secondary | ICD-10-CM | POA: Diagnosis not present

## 2022-05-12 DIAGNOSIS — Z86711 Personal history of pulmonary embolism: Secondary | ICD-10-CM | POA: Diagnosis not present

## 2022-05-12 DIAGNOSIS — I700 Atherosclerosis of aorta: Secondary | ICD-10-CM | POA: Diagnosis not present

## 2022-05-16 DIAGNOSIS — E1122 Type 2 diabetes mellitus with diabetic chronic kidney disease: Secondary | ICD-10-CM | POA: Diagnosis not present

## 2022-05-16 DIAGNOSIS — J439 Emphysema, unspecified: Secondary | ICD-10-CM | POA: Diagnosis not present

## 2022-05-16 DIAGNOSIS — E1151 Type 2 diabetes mellitus with diabetic peripheral angiopathy without gangrene: Secondary | ICD-10-CM | POA: Diagnosis not present

## 2022-05-16 DIAGNOSIS — I129 Hypertensive chronic kidney disease with stage 1 through stage 4 chronic kidney disease, or unspecified chronic kidney disease: Secondary | ICD-10-CM | POA: Diagnosis not present

## 2022-05-16 DIAGNOSIS — G8929 Other chronic pain: Secondary | ICD-10-CM | POA: Diagnosis not present

## 2022-05-16 DIAGNOSIS — G9341 Metabolic encephalopathy: Secondary | ICD-10-CM | POA: Diagnosis not present

## 2022-05-17 ENCOUNTER — Encounter: Payer: Self-pay | Admitting: Surgery

## 2022-05-17 ENCOUNTER — Other Ambulatory Visit: Payer: Self-pay

## 2022-05-17 ENCOUNTER — Ambulatory Visit (INDEPENDENT_AMBULATORY_CARE_PROVIDER_SITE_OTHER): Payer: Medicare Other | Admitting: Surgery

## 2022-05-17 VITALS — Temp 97.7°F | Ht 66.0 in | Wt 199.0 lb

## 2022-05-17 DIAGNOSIS — R0789 Other chest pain: Secondary | ICD-10-CM

## 2022-05-17 NOTE — Progress Notes (Signed)
Outpatient Surgical Follow Up  05/17/2022  Tammy [name] is an 77 y.o. female.   Chief Complaint  Patient presents with   Follow-up    Discuss CT    HPI: 77 year old female seen for left-sided chest wall pain as well as left-sided breast pain, She Does have a history of breast cancer status postlumpectomy and radiation therapy 2020. She is accompanied by her husband.  [name] comes in a wheelchair due to deconditioning and weakness  Procedures in the past consistent with several back surgeries, cholecystectomy, appendectomy, appendectomy, knee replacement. States that she hurts all over the place.  She also reports severe pain on the lateral left chest wall worsening when she lays down.  The pain is sharp intermittent severe.  Also she experiences pain in the left breast on the medial aspect.  This is near her lumpectomy incision.  No fevers no chills.  She did have a recent hospitalization for sepsis and pneumonia that was treated with multiple antibiotics.  She is recovering from it.  She has been significantly weak over the last few months. Also had radiation to the left lung  She had a recent CT scan of the chest that I have personally reviewed showing emphysematous changes as well as fractures of two ribs on the left, no hematomas, no other concerning issues on her chest  Past Medical History:  Diagnosis Date   Asthma    Benign neoplasm of colon    Breast cancer ([place]) 08/2018   left breast   Cervical disc disease    "bulging" - no limitations per pt   Chronic diastolic CHF (congestive heart failure) ([place])    a. 07/2012 Echo: Nl EF. mild inferoseptal HK, Gr 2 DD, mild conc LVH, mild PR/TR, mild PAH; b. 08/2019 Echo: EF 55-60%, no rwma, Gr1 DD. Nl RV size/fxn.   CKD (chronic kidney disease), stage III (HCC)    COPD (chronic obstructive pulmonary disease) (HCC)    Coronary artery disease    a. 11/2011 Cath: LAD 27md, LCX min irregs, RCA 70p, 103mith L->R collats, EF 60%-->Med Rx; b.  03/2019 Cath: LM nl, LAD 40-5069mCX large, mild diff dzs. OM1/2 nl. RCA known to be 100m74m->R collats from LAD & LCX/OM.   Diabetes mellitus without complication (HCC)[place] Diagnosis unknown 05/07/2022   Fusion of lumbar spine 12/22/2015   GERD (gastroesophageal reflux disease)    History of DVT (deep vein thrombosis)    History of tobacco abuse    a. Quit 2011.   Hyperlipidemia    Hypertension    Hypothyroidism    Lung cancer (HCC)[place] Malignant neoplasm of unspecified site of right female breast (HCC)[place]/03/1999   Obesity    Palpitations    Personal history of radiation therapy    PONV (postoperative nausea and vomiting)    Pulmonary embolus (HCC)[place]/15/2015   RLL. Presented with SOB and elevated d-dimer.    PVD (peripheral vascular disease) (HCC)[place] a. PTA of right leg and left femoral artery with stenosis; b. 09/2020 ABI: R 0.93, L 0.88 - unchanged from prior study.   [facility]   TIAs - no deficits    Past Surgical History:  Procedure Laterality Date   ANGIOPLASTY / STENTING FEMORAL     PVD; angioplasty right leg and left femoral artery with stenosis.    APPENDECTOMY  12/2014   BACK SURGERY  03/2012   nerve stimulator inserted   BACK SURGERY  screws, rods replaced with new hardware.   BACK SURGERY  11/2016   BREAST LUMPECTOMY Left 08/26/2018   BREAST SURGERY     CARDIAC CATHETERIZATION  12/07/2011   Mid LAD 50%, distal LAD 50%, mid RCA 70%, distal RCA 100%    CARDIAC CATHETERIZATION  01/04/2011   100% occluded mid RCA with good collaterals from distal LAD, normal LVEF.   CATARACT EXTRACTION Right 12/21/2021   CHOLECYSTECTOMY     COLONOSCOPY  2013   COLONOSCOPY WITH PROPOFOL N/A 05/24/2015   Procedure: COLONOSCOPY WITH PROPOFOL;  Surgeon: [name], MD;  Location: [place];  Service: Endoscopy;  Laterality: N/A;  Diabetic - oral meds   COLONOSCOPY WITH PROPOFOL N/A 01/28/2019   Procedure: COLONOSCOPY WITH PROPOFOL;  Surgeon: [name], MD;  Location: [facility];  Service: Endoscopy;  Laterality: N/A;   DIAGNOSTIC LAPAROSCOPY     ESOPHAGOGASTRODUODENOSCOPY (EGD) WITH PROPOFOL N/A 01/28/2019   Procedure: ESOPHAGOGASTRODUODENOSCOPY (EGD) WITH PROPOFOL;  Surgeon: [name], MD;  Location: [facility];  Service: Endoscopy;  Laterality: N/A;   LAPAROSCOPIC APPENDECTOMY N/A 01/06/2015   Procedure: APPENDECTOMY LAPAROSCOPIC;  Surgeon: [name], MD;  Location: [facility];  Service: General;  Laterality: N/A;   LEFT HEART CATH AND CORONARY ANGIOGRAPHY Left 04/04/2019   Procedure: LEFT HEART CATH AND CORONARY ANGIOGRAPHY;  Surgeon: [name], MD;  Location: [place] [facility];  Service: Cardiovascular;  Laterality: Left;   MASTECTOMY W/ SENTINEL NODE BIOPSY Left 08/26/2018   Procedure: PARTIAL MASTECTOMY WIDE EXCISION WITH SENTINEL LYMPH NODE BIOPSY LEFT;  Surgeon: [name], MD;  Location: [facility];  Service: General;  Laterality: Left;   POLYPECTOMY  05/24/2015   Procedure: POLYPECTOMY;  Surgeon: [name], MD;  Location: [place];  Service: Endoscopy;;   REPLACEMENT TOTAL KNEE Right    RIGHT/LEFT HEART CATH AND CORONARY ANGIOGRAPHY Bilateral 09/26/2021   Procedure: RIGHT/LEFT HEART CATH AND CORONARY ANGIOGRAPHY;  Surgeon: Wellington Hampshire, MD;  Location: [place] [facility];  Service: Cardiovascular;  Laterality: Bilateral;   THYROIDECTOMY     TOTAL KNEE ARTHROPLASTY Right 11/13/2019   TRIGGER FINGER RELEASE      Family History  Problem Relation Age of Onset   Cancer Mother        colon   Hypertension Father    Heart disease Father    Heart attack Father    Diabetes Father    Cancer Sister        lung   COPD Sister    COPD Sister    Heart attack Brother    Diabetes Brother    Hypertension Brother    Heart disease Brother    Heart attack Brother    Diabetes Brother    Hypertension Brother    Heart disease Brother    Heart attack Brother    Diabetes  Brother    Hypertension Brother    Heart disease Brother    Colon cancer Brother 56   Lung cancer Brother    Breast cancer Neg Hx     Social History:  reports that she quit smoking about 15 years ago. Her smoking use included cigarettes. She has a 7.50 pack-year smoking history. She has never used smokeless tobacco. She reports that she does not drink alcohol and does not use drugs.  Allergies:  Allergies  Allergen Reactions   Hydrocodone Other (See Comments)    Unresponsive   Aleve [Naproxen Sodium]     Hives & itching  Contrast Media [Iodinated Contrast Media]     Pt avoids due to kidney issues   Ioxaglate Other (See Comments)    (iodine) Pt avoids due to kidney issues    Oxycodone Other (See Comments)    hallucinations   Ranexa [Ranolazine]     Sick on stomach   Sulfa Antibiotics Other (See Comments)    Pt avoids due to kidney issues   Tramadol Other (See Comments)    nightmares Other reaction(s): Hallucination, Other (See Comments) nightmares     Medications reviewed.    ROS Full ROS performed and is otherwise negative other than what is stated in HPI   Temp 97.7 F (36.5 C) (Oral)   Ht '5\' 6"'$  (1.676 m)   Wt 199 lb (90.3 kg)   SpO2 94%   BMI 32.12 kg/m   Physical Exam CONSTITUTIONAL: NAD. EYES: Pupils are equal, round, and reactive to light, Sclera are non-icteric. LYMPH NODES:  Lymph nodes in the neck are normal. RESPIRATORY:  Lungs are clear. There is normal respiratory effort, with equal breath sounds bilaterally, and without pathologic use of accessory muscles.Chest .SHe is mildly tender to palpation.   on the left side. CARDIOVASCULAR: Heart is regular without murmurs, gallops, or rubs. GI: The abdomen is  soft, non tender to palpation.  No obvious ventral hernia defects.  And nondistended. There are no palpable masses. There is no hepatosplenomegaly. There are normal bowel sounds in all quadrants. GU: Rectal deferred.   MUSCULOSKELETAL: Normal  muscle strength and tone. No cyanosis or edema.   SKIN: Turgor is good and there are no pathologic skin lesions or ulcers. NEUROLOGIC: Motor and sensation is grossly normal. Cranial nerves are grossly intact. PSYCH:  Oriented to person, place and time. Affect is normal.  Assessment/Plan: 77 year old female with chest wall pain chronic, .  This is likely multifactorial including rib fx, and  radiation changes.  Unfortunately no good solutions. May continue prn tylenol and supportive measures. At this time there is no much that I can offer Please note I spent 30 minutes in this encounter and including personally reviewing imaging studies, medical records and performing appropriate documentation.   Caroleen Hamman, MD [facility] General Surgeon

## 2022-05-17 NOTE — Patient Instructions (Signed)
PLease call our office if you have questions or concerns.

## 2022-05-18 ENCOUNTER — Ambulatory Visit (INDEPENDENT_AMBULATORY_CARE_PROVIDER_SITE_OTHER): Payer: Medicare Other | Admitting: Family Medicine

## 2022-05-18 ENCOUNTER — Encounter: Payer: Self-pay | Admitting: Family Medicine

## 2022-05-18 VITALS — BP 103/65 | HR 78 | Temp 97.5°F

## 2022-05-18 DIAGNOSIS — N750 Cyst of Bartholin's gland: Secondary | ICD-10-CM

## 2022-05-18 MED ORDER — DOXYCYCLINE HYCLATE 100 MG PO TABS: 100.0000 mg | ORAL_TABLET | Freq: Two times a day (BID) | ORAL | 0 refills | Status: DC

## 2022-05-18 NOTE — Progress Notes (Signed)
BP 103/65   Pulse 78   Temp (!) 97.5 F (36.4 C) (Oral)   SpO2 92%    Subjective:    Patient ID: Tammy [name], female    DOB: 04/28/1945, 77 y.o.   MRN: [id number]  HPI: Tammy [name] is a 77 y.o. female  Chief Complaint  Patient presents with   vulvar lesion   SKIN LESION Duration: couple of weeks Location: L side of vulva- posterior   Painful: no Itching: no Onset: gradual Context: bigger History of skin cancer: no  Relevant past medical, surgical, family and social history reviewed and updated as indicated. Interim medical history since our last visit reviewed. Allergies and medications reviewed and updated.  Review of Systems  Constitutional: Negative.   Respiratory: Negative.    Cardiovascular: Negative.   Gastrointestinal: Negative.   Musculoskeletal: Negative.   Skin: Negative.   Psychiatric/Behavioral: Negative.      Per HPI unless specifically indicated above     Objective:    BP 103/65   Pulse 78   Temp (!) 97.5 F (36.4 C) (Oral)   SpO2 92%   Wt Readings from Last 3 Encounters:  05/17/22 199 lb (90.3 kg)  05/02/22 200 lb (90.7 kg)  03/07/22 207 lb 3.7 oz (94 kg)    Physical Exam Vitals and nursing note reviewed.  Constitutional:      General: She is not in acute distress.    Appearance: Normal appearance. She is obese. She is not ill-appearing, toxic-appearing or diaphoretic.  HENT:     Head: Normocephalic and atraumatic.     Right Ear: External ear normal.     Left Ear: External ear normal.     Nose: Nose normal.     Mouth/Throat:     Mouth: Mucous membranes are moist.     Pharynx: Oropharynx is clear.  Eyes:     General: No scleral icterus.       Right eye: No discharge.        Left eye: No discharge.     Extraocular Movements: Extraocular movements intact.     Conjunctiva/sclera: Conjunctivae normal.     Pupils: Pupils are equal, round, and reactive to light.  Cardiovascular:     Rate and Rhythm: Normal rate and regular rhythm.      Pulses: Normal pulses.     Heart sounds: Normal heart sounds. No murmur heard.    No friction rub. No gallop.  Pulmonary:     Effort: Pulmonary effort is normal. No respiratory distress.     Breath sounds: Normal breath sounds. No stridor. No wheezing, rhonchi or rales.  Chest:     Chest wall: No tenderness.  Genitourinary:    Comments: Area of thickened skin at L side of her vulva Musculoskeletal:        General: Normal range of motion.     Cervical back: Normal range of motion and neck supple.  Skin:    General: Skin is warm and dry.     Capillary Refill: Capillary refill takes less than 2 seconds.     Coloration: Skin is not jaundiced or pale.     Findings: No bruising, erythema, lesion or rash.  Neurological:     General: No focal deficit present.     Mental Status: She is alert and oriented to person, place, and time. Mental status is at baseline.  Psychiatric:        Mood and Affect: Mood normal.  Behavior: Behavior normal.        Thought Content: Thought content normal.        Judgment: Judgment normal.     Results for orders placed or performed in visit on 03/16/22  Bayer DCA Hb A1c Waived  Result Value Ref Range   HB A1C (BAYER DCA - WAIVED) 7.4 (H) 4.8 - 5.6 %  CBC with Differential/Platelet  Result Value Ref Range   WBC 9.3 3.4 - 10.8 x10E3/uL   RBC 4.85 3.77 - 5.28 x10E6/uL   Hemoglobin 13.7 11.1 - 15.9 g/dL   Hematocrit 42.9 34.0 - 46.6 %   MCV 89 79 - 97 fL   MCH 28.2 26.6 - 33.0 pg   MCHC 31.9 31.5 - 35.7 g/dL   RDW 14.1 11.7 - 15.4 %   Platelets 412 150 - 450 x10E3/uL   Neutrophils 54 Not Estab. %   Lymphs 26 Not Estab. %   Monocytes 12 Not Estab. %   Eos 4 Not Estab. %   Basos 1 Not Estab. %   Neutrophils Absolute 5.1 1.4 - 7.0 x10E3/uL   Lymphocytes Absolute 2.4 0.7 - 3.1 x10E3/uL   Monocytes Absolute 1.1 (H) 0.1 - 0.9 x10E3/uL   EOS (ABSOLUTE) 0.4 0.0 - 0.4 x10E3/uL   Basophils Absolute 0.1 0.0 - 0.2 x10E3/uL   Immature Granulocytes 3  Not Estab. %   Immature Grans (Abs) 0.3 (H) 0.0 - 0.1 x10E3/uL  Comprehensive metabolic panel  Result Value Ref Range   Glucose 85 70 - 99 mg/dL   BUN 20 8 - 27 mg/dL   Creatinine, Ser 1.25 (H) 0.57 - 1.00 mg/dL   eGFR 44 (L) >59 mL/min/1.73   BUN/Creatinine Ratio 16 12 - 28   Sodium 142 134 - 144 mmol/L   Potassium 4.7 3.5 - 5.2 mmol/L   Chloride 102 96 - 106 mmol/L   CO2 25 20 - 29 mmol/L   Calcium 9.2 8.7 - 10.3 mg/dL   Total Protein 6.4 6.0 - 8.5 g/dL   Albumin 3.8 3.8 - 4.8 g/dL   Globulin, Total 2.6 1.5 - 4.5 g/dL   Albumin/Globulin Ratio 1.5 1.2 - 2.2   Bilirubin Total 0.3 0.0 - 1.2 mg/dL   Alkaline Phosphatase 84 44 - 121 IU/L   AST 29 0 - 40 IU/L   ALT 22 0 - 32 IU/L  Lipid Panel w/o Chol/HDL Ratio  Result Value Ref Range   Cholesterol, Total 136 100 - 199 mg/dL   Triglycerides 175 (H) 0 - 149 mg/dL   HDL 46 >39 mg/dL   VLDL Cholesterol Cal 29 5 - 40 mg/dL   LDL Chol Calc (NIH) 61 0 - 99 mg/dL  TSH  Result Value Ref Range   TSH 0.812 0.450 - 4.500 uIU/mL   *Note: Due to a large number of results and/or encounters for the requested time period, some results have not been displayed. A complete set of results can be found in Results Review.      Assessment & Plan:   Problem List Items Addressed This Visit   None Visit Diagnoses     Bartholin gland cyst    -  Primary   Will treat with her doxycycline. Call with any concerns. Continue to monitor.        Follow up plan: Return in about 4 weeks (around 06/15/2022).

## 2022-05-19 ENCOUNTER — Telehealth: Payer: Self-pay

## 2022-05-19 NOTE — Progress Notes (Cosign Needed)
Care Management & Coordination Services Pharmacy Team  Reason for Encounter: Diabetes  Contacted patient to discuss diabetes disease state. Spoke with patient on 05/31/2022    Recent office visits:  05/19/22-[name], DO (PCP) Seen for vulvar lesion. Start on Doxycycline 100 mg twice daily. Follow up in 4 weeks.  Recent consult visits:  05/17/22-Diego Sarita Haver, MD (General surgery) Follow up visit to discuss CT.   Hospital visits:  None in previous 6 months  Medications: Outpatient Encounter Medications as of 05/19/2022  Medication Sig   acetaminophen (TYLENOL) 325 MG tablet Take 2 tablets (650 mg total) by mouth every 6 (six) hours as needed for mild pain (or Fever >/= 101).   albuterol (PROVENTIL) (2.5 MG/3ML) 0.083% nebulizer solution Take 3 mLs (2.5 mg total) by nebulization every 6 (six) hours as needed for wheezing or shortness of breath.   albuterol (VENTOLIN HFA) 108 (90 Base) MCG/ACT inhaler Inhale 2 puffs into the lungs every 6 (six) hours as needed for wheezing or shortness of breath.   anastrozole (ARIMIDEX) 1 MG tablet TAKE 1 TABLET BY MOUTH DAILY   apixaban (ELIQUIS) 2.5 MG TABS tablet Take 1 tablet (2.5 mg total) by mouth 2 (two) times daily.   benzonatate (TESSALON) 200 MG capsule Take 1 capsule (200 mg total) by mouth 2 (two) times daily as needed for cough.   Calcium Carb-Cholecalciferol (CALCIUM 600 + D) 600-200 MG-UNIT TABS Take 1 tablet by mouth daily.    cholecalciferol (VITAMIN D3) 25 MCG (1000 UT) tablet Take 1,000 Units by mouth daily.   CRANBERRY PO Take 2 capsules by mouth daily.   doxycycline (VIBRA-TABS) 100 MG tablet Take 1 tablet (100 mg total) by mouth 2 (two) times daily.   DULoxetine (CYMBALTA) 60 MG capsule Take 2 capsules (120 mg total) by mouth daily.   fluticasone (FLONASE) 50 MCG/ACT nasal spray Place 1 spray into both nostrils daily.   Fluticasone-Umeclidin-Vilant (TRELEGY ELLIPTA) 100-62.5-25 MCG/ACT AEPB Inhale 1 puff into the lungs  daily.   gabapentin (NEURONTIN) 400 MG capsule TAKE 1 CAPSULE BY MOUTH THREE TIMES DAILY   glipiZIDE (GLUCOTROL) 5 MG tablet TAKE 1 TABLET BY MOUTH DAILY BEFORE BREAKFAST   isosorbide mononitrate (IMDUR) 60 MG 24 hr tablet TAKE ONE TABLET BY MOUTH AT BEDTIME   Lactobacillus (PROBIOTIC ACIDOPHILUS PO) Take 1 capsule by mouth daily at 12 noon.   metoprolol tartrate (LOPRESSOR) 25 MG tablet Take 1 tablet (25 mg total) by mouth 2 (two) times daily.   montelukast (SINGULAIR) 10 MG tablet Take 1 tablet (10 mg total) by mouth at bedtime.   MYRBETRIQ 50 MG TB24 tablet TAKE 1 TABLET BY MOUTH DAILY   nitroGLYCERIN (NITROSTAT) 0.4 MG SL tablet Place 1 tablet (0.4 mg total) under the tongue every 5 (five) minutes as needed for chest pain. Total of 3 doses   ondansetron (ZOFRAN) 4 MG tablet Take 1 tablet (4 mg total) by mouth every 6 (six) hours as needed for nausea.   oxyCODONE-acetaminophen (PERCOCET/ROXICET) 5-325 MG tablet TAKE 1/2 TO 1 TABLET BY MOUTH EVERY 8 HOURS AS NEEDED FOR SEVERE PAIN   pantoprazole (PROTONIX) 40 MG tablet TAKE 1 TABLET BY MOUTH DAILY   Pumpkin Seed-Soy Germ (AZO BLADDER CONTROL/GO-LESS PO) Take 1 tablet by mouth in the morning and at bedtime.   rosuvastatin (CRESTOR) 40 MG tablet Take 1 tablet (40 mg total) by mouth daily.   SYNTHROID 150 MCG tablet Take 1 tablet (150 mcg total) by mouth daily before breakfast.   tirzepatide (MOUNJARO) 12.5  MG/0.5ML Pen Inject 12.5 mg into the skin once a week.   trimethoprim (TRIMPEX) 100 MG tablet TAKE ONE TABLET BY MOUTH EVERY DAY   UNABLE TO FIND Med Name: Energy, memory and mood enhancer   ZINC CITRATE PO Take 1 tablet by mouth daily.   No facility-administered encounter medications on file as of 05/19/2022.    Recent Relevant Labs: Lab Results  Component Value Date/Time   HGBA1C 7.4 (H) 03/16/2022 01:23 PM   HGBA1C 7.5 11/23/2021 12:00 AM   HGBA1C 8.5 (H) 09/08/2021 09:22 AM   HGBA1C 8.7 01/31/2016 12:00 AM   MICROALBUR 150 (H)  08/19/2020 10:11 AM   MICROALBUR 150 (H) 01/02/2019 10:30 AM    Kidney Function Lab Results  Component Value Date/Time   CREATININE 1.25 (H) 03/16/2022 01:27 PM   CREATININE 1.15 (H) 03/10/2022 09:30 AM   CREATININE 1.31 (H) 09/20/2011 11:34 AM   GFRNONAA 49 (L) 03/10/2022 09:30 AM   GFRNONAA 42 (L) 09/20/2011 11:34 AM   GFRAA 32 (L) 02/10/2020 02:40 PM   GFRAA 48 (L) 09/20/2011 11:34 AM   Current antihyperglycemic regimen:  Mounjaro 12.5 mg/0.5ML pen inject 12.5 mg into the skin once a week    Patient verbally confirms she is taking the above medications as directed. Yes  What diet changes have been made to improve diabetes control?         Patient states she has a steady diet.  What recent interventions/DTPs have been made to improve glycemic control:  None noted  Have there been any recent hospitalizations or ED visits since last visit with PharmD? No  Patient denies hypoglycemic symptoms, including Pale, Sweaty, Shaky, Hungry, Nervous/irritable, and Vision changes  Patient denies hyperglycemic symptoms, including blurry vision, excessive thirst, fatigue, polyuria, and weakness  How often are you checking your blood sugar? twice daily  What are your blood sugars ranging?  Fasting: 117-05/31/22 Before meals:  After meals: 130-05/31/22  Bedtime:   During the week, how often does your blood glucose drop below 70? Never  Are you checking your feet daily/regularly? Yes  Patient states she was having some back pain a couple weeks ago and stated that her son takes gabapentin just like she takes gabapentin as well. He recommended her to take extra gabapentin's for her back pain since it helps him so she said she took 3 more in total of 6 gabapentin's during the day and helped her a lot with her back pain but will run out if she does this. I stated that her PCP is the one who prescribed the medication and will need to mention this at her appointment that is on 06/20/22. She  stated she understood and will talk to her PCP about changing the quantity of her script.   Adherence Review: Is the patient currently on a STATIN medication? Yes Is the patient currently on ACE/ARB medication? No Does the patient have >5 day gap between last estimated fill dates? No   Star Rating Drugs:  Glipizide 5 mg Last filled:02/25/22 90 DS, 11/19/21 90  Rosuvastatin 40 mg Last filled:04/04/22 90 DS, 12/14/21 90 DS   Care Gaps: Annual wellness visit in last year? Yes Last eye exam / retinopathy screening:11/09/21 Last diabetic foot exam:08/2021   Corrie Mckusick, RMA

## 2022-05-24 ENCOUNTER — Telehealth: Payer: Medicare Other | Admitting: Family Medicine

## 2022-05-29 DIAGNOSIS — G9341 Metabolic encephalopathy: Secondary | ICD-10-CM | POA: Diagnosis not present

## 2022-05-29 DIAGNOSIS — G8929 Other chronic pain: Secondary | ICD-10-CM | POA: Diagnosis not present

## 2022-05-29 DIAGNOSIS — J439 Emphysema, unspecified: Secondary | ICD-10-CM | POA: Diagnosis not present

## 2022-05-29 DIAGNOSIS — I129 Hypertensive chronic kidney disease with stage 1 through stage 4 chronic kidney disease, or unspecified chronic kidney disease: Secondary | ICD-10-CM | POA: Diagnosis not present

## 2022-05-29 DIAGNOSIS — E1122 Type 2 diabetes mellitus with diabetic chronic kidney disease: Secondary | ICD-10-CM | POA: Diagnosis not present

## 2022-05-29 DIAGNOSIS — E1151 Type 2 diabetes mellitus with diabetic peripheral angiopathy without gangrene: Secondary | ICD-10-CM | POA: Diagnosis not present

## 2022-06-01 ENCOUNTER — Other Ambulatory Visit: Payer: Self-pay | Admitting: Family Medicine

## 2022-06-01 ENCOUNTER — Other Ambulatory Visit: Payer: Self-pay

## 2022-06-01 ENCOUNTER — Telehealth: Payer: Self-pay | Admitting: Family Medicine

## 2022-06-01 ENCOUNTER — Other Ambulatory Visit: Payer: Self-pay | Admitting: Nurse Practitioner

## 2022-06-01 ENCOUNTER — Ambulatory Visit (INDEPENDENT_AMBULATORY_CARE_PROVIDER_SITE_OTHER): Payer: Medicare Other

## 2022-06-01 DIAGNOSIS — E1165 Type 2 diabetes mellitus with hyperglycemia: Secondary | ICD-10-CM

## 2022-06-01 DIAGNOSIS — I25118 Atherosclerotic heart disease of native coronary artery with other forms of angina pectoris: Secondary | ICD-10-CM

## 2022-06-01 NOTE — Patient Instructions (Signed)
Please call the care guide team at [phone] if you need to cancel or reschedule your appointment.   If you are experiencing a Mental Health or [place] or need someone to talk to, please call the Suicide and Crisis Lifeline: 988 call the [place] National Suicide Prevention Lifeline: (217)760-1028 or TTY: (607)809-9959 TTY (862)203-3480) to talk to a trained counselor call 1-800-273-TALK (toll free, 24 hour hotline)   Following is a copy of the CCM Program Consent:  CCM service includes personalized support from designated clinical staff supervised by the physician, including individualized plan of care and coordination with other care providers 24/7 contact phone numbers for assistance for urgent and routine care needs. Service will only be billed when office clinical staff spend 20 minutes or more in a month to coordinate care. Only one practitioner may furnish and bill the service in a calendar month. The patient may stop CCM services at amy time (effective at the end of the month) by phone call to the office staff. The patient will be responsible for cost sharing (co-pay) or up to 20% of the service fee (after annual deductible is met)  Following is a copy of your full provider care plan:   Goals Addressed             This Visit's Progress    CCM (DIABETES) EXPECTED OUTCOME: MONITOR, SELF-MANAGE AND REDUCE SYMPTOMS OF DIABETES       Current Barriers:  Knowledge Deficits related to how to effectively manage and maintain DM and goal of A1C <8% Chronic Disease Management support and education needs related to effective management DM Goal set by the provider <8.1% Lab Results  Component Value Date   HGBA1C 7.4 (H) 03/16/2022     Planned Interventions: Provided education to patient about basic DM disease process. Education provided; Reviewed medications with patient and discussed importance of medication adherence. The patient is compliant with medications. She is now  taking Mounjaro 12.5mg  and is doing well with the [facility].The patient is having a hard time getting her Mounjaro  and called the RNCM to see if she could get assistance with finding someone that could fill her medications. Provided the patient with numbers to [facility] outpatient pharmacy and [facility] outpatient pharmacy. The patient will call and see if either of these pharmacies have the [facility] in stock and can fill for her. She will let the [facility] if she has new concerns.      Reviewed prescribed diet with patient heart healthy/ADA diet. The patient is doing well and says that she has lost weight. She is eating but not snacking like she was. She will drink boost every now and then to help with protein intake. Education and support given.  Counseled on importance of regular laboratory monitoring as prescribed. Labs are up to date. Denies any acute findings. A1C is stable but trending down.;        Discussed plans with patient for ongoing care management follow up and provided patient with direct contact information for care management team;      Provided patient with written educational materials related to hypo and hyperglycemia and importance of correct treatment. Review of how to effectively manage drops in blood sugars or highs. The patient states the [facility] is really helpful and alerts her when her blood sugars is high or low. She denies any highs or lows. The highest she has seen is 242 and she knew how to correct it. She likes having the freestyle libre for monitoring  of her blood sugars;       Reviewed scheduled/upcoming provider appointments including: 06-20-2022;         Advised patient, providing education and rationale, to check cbg when you have symptoms of low or high blood sugar and has freestyle libre  and record. States her blood sugars this am was 111. She says they are staying in range well. Will continue to monitor.  call provider for findings outside established parameters;       Review  of patient status, including review of consultants reports, relevant laboratory and other test results, and medications completed;       Advised patient to discuss changes in DM and diabetes health with provider;      Screening for signs and symptoms of depression related to chronic disease state;        Assessed social determinant of health barriers;         Symptom Management: Take medications as prescribed   Attend all scheduled provider appointments Call pharmacy for medication refills 3-7 days in advance of running out of medications Call provider office for new concerns or questions  call the Suicide and Crisis Lifeline: 988 call the [place] National Suicide Prevention Lifeline: 310-782-1080 or TTY: 253-017-1017 TTY 4301858039) to talk to a trained counselor call 1-800-273-TALK (toll free, 24 hour hotline) if experiencing a Mental Health or [place]  check feet daily for cuts, sores or redness trim toenails straight across manage portion size wash and dry feet carefully every day wear comfortable, cotton socks wear comfortable, well-fitting shoes  Follow Up Plan: Telephone follow up appointment with care management team member scheduled for: 06-20-2022 at 51 am       CCM Expected Outcome:  Monitor, Self-Manage and Reduce Symptoms of CAD       Current Barriers:  Chronic Disease Management support and education needs related to effective management of CAD BP Readings from Last 3 Encounters:  05/18/22 103/65  05/02/22 120/60  04/24/22 (!) 132/50     Planned Interventions: Assessed understanding of CAD diagnosis. The patient states that she is much better and having a good day today. Saw the cardiologist recently and got a good report. Denies any new concerns with CAD or heart health. Blood pressures are more stable. Education and support given.  Medications reviewed including medications utilized in CAD treatment plan. Is compliant with medications. Denies any  new concerns with medications.  Provided education on importance of blood pressure control in management of CAD. Her blood pressures are stable. She denies any acute findings with changes in her blood pressures. ; Provided education on Importance of limiting foods high in cholesterol. Has lost weight, eats healthy, does not eat between meals.; Counseled on importance of regular laboratory monitoring as prescribed. The patient has regular lab work.; Reviewed Importance of taking all medications as prescribed. Takes medications as prescribed.  Reviewed Importance of attending all scheduled provider appointments. Next appointment with pcp is 05-18-2022- reminder given today.  Advised to report any changes in symptoms or exercise tolerance Advised patient to discuss changes in CAD or heart health with provider; Screening for signs and symptoms of depression related to chronic disease state;  Assessed social determinant of health barriers;   Symptom Management: Take medications as prescribed   Attend all scheduled provider appointments Call provider office for new concerns or questions  call the Suicide and Crisis Lifeline: 988 call the [place] National Suicide Prevention Lifeline: 308-851-9673 or TTY: 5390720880 TTY (912)813-8941) to talk to a  trained counselor call 1-800-273-TALK (toll free, 24 hour hotline) if experiencing a Mental Health or [place]   Follow Up Plan: Telephone follow up appointment with care management team member scheduled for: 06-20-2022 at 1030 am          Patient verbalizes understanding of instructions and care plan provided today and agrees to view in [place]. Active MyChart status and patient understanding of how to access instructions and care plan via MyChart confirmed with patient.     Telephone follow up appointment with care management team member scheduled for: 06-20-2022 at 1030 am

## 2022-06-01 NOTE — Telephone Encounter (Signed)
Patient called in states her pharmacy , [facility] doesn't have the 10 mg monjauro but [facility] has the 2.5 and she wants to know can she get this one. Please call back

## 2022-06-01 NOTE — Telephone Encounter (Signed)
Pt request that the Rx for tirzepatide [facility]) 12.5 MG/0.5ML Pen be sent to  [place] Phone: [phone]  Fax: [phone]

## 2022-06-01 NOTE — Telephone Encounter (Signed)
Requested medication (s) are due for refill today - yes  Requested medication (s) are on the active medication list -yes  Future visit scheduled -yes  Last refill: 10/14/21 #90 1RF  Notes to clinic: off protocol- provider review   Requested Prescriptions  Pending Prescriptions Disp Refills   trimethoprim (TRIMPEX) 100 MG tablet [Pharmacy Med Name: TRIMETHOPRIM 100 MG TAB] 90 tablet 1    Sig: TAKE ONE TABLET BY MOUTH EVERY DAY     Off-Protocol Failed - 06/01/2022 11:49 AM      Failed - Medication not assigned to a protocol, review manually.      Passed - Valid encounter within last 12 months    Recent Outpatient Visits           2 weeks ago Bartholin gland cyst   [place], [place], DO   1 month ago Uncontrolled type 2 diabetes mellitus with hyperglycemia, with long-term current use of insulin ([place])   [place], Megan P, DO   1 month ago Uncontrolled type 2 diabetes mellitus with hyperglycemia, with long-term current use of insulin ([place])   [place], [place] P, DO   2 months ago Influenza A with pneumonia   [place] [place], Megan P, DO   3 months ago [place], Barb Merino, DO       Future Appointments             In 2 weeks Wynetta Emery, Barb Merino, DO [place], [facility]               Requested Prescriptions  Pending Prescriptions Disp Refills   trimethoprim (TRIMPEX) 100 MG tablet [Pharmacy Med Name: TRIMETHOPRIM 100 MG TAB] 90 tablet 1    Sig: TAKE ONE TABLET BY MOUTH EVERY DAY     Off-Protocol Failed - 06/01/2022 11:49 AM      Failed - Medication not assigned to a protocol, review manually.      Passed - Valid encounter within last 12 months    Recent Outpatient Visits           2 weeks ago Bartholin gland cyst   [place], [place], DO    1 month ago Uncontrolled type 2 diabetes mellitus with hyperglycemia, with long-term current use of insulin ([place])   [place], Megan P, DO   1 month ago Uncontrolled type 2 diabetes mellitus with hyperglycemia, with long-term current use of insulin ([place])   [place], Megan P, DO   2 months ago Influenza A with pneumonia   [place] [facility] [place], Megan P, DO   3 months ago [place], DO       Future Appointments             In 2 weeks Wynetta Emery, Barb Merino, DO [place], [facility]

## 2022-06-01 NOTE — Chronic Care Management (AMB) (Signed)
Chronic Care Management   CCM RN Visit Note  06/01/2022 Name: Tammy [name] MRN: [id number] DOB: 1945-11-28  Subjective: Tammy [name] is a 77 y.o. year old female who is a primary care patient of [name], DO. The patient was referred to the Chronic Care Management team for assistance with care management needs subsequent to provider initiation of CCM services and plan of care.    Today's Visit:  Engaged with patient by telephone for follow up visit.        Goals Addressed             This Visit's Progress    CCM (DIABETES) EXPECTED OUTCOME: MONITOR, SELF-MANAGE AND REDUCE SYMPTOMS OF DIABETES       Current Barriers:  Knowledge Deficits related to how to effectively manage and maintain DM and goal of A1C <8% Chronic Disease Management support and education needs related to effective management DM Goal set by the provider <8.1% Lab Results  Component Value Date   HGBA1C 7.4 (H) 03/16/2022     Planned Interventions: Provided education to patient about basic DM disease process. Education provided; Reviewed medications with patient and discussed importance of medication adherence. The patient is compliant with medications. She is now taking Mounjaro 12.5mg  and is doing well with the [facility].The patient is having a hard time getting her Mounjaro  and called the RNCM to see if she could get assistance with finding someone that could fill her medications. Provided the patient with numbers to [facility] outpatient pharmacy and [facility] outpatient pharmacy. The patient will call and see if either of these pharmacies have the [facility] in stock and can fill for her. She will let the [facility] if she has new concerns.      Reviewed prescribed diet with patient heart healthy/ADA diet. The patient is doing well and says that she has lost weight. She is eating but not snacking like she was. She will drink boost every now and then to help with protein intake. Education and support given.  Counseled on  importance of regular laboratory monitoring as prescribed. Labs are up to date. Denies any acute findings. A1C is stable but trending down.;        Discussed plans with patient for ongoing care management follow up and provided patient with direct contact information for care management team;      Provided patient with written educational materials related to hypo and hyperglycemia and importance of correct treatment. Review of how to effectively manage drops in blood sugars or highs. The patient states the [facility] is really helpful and alerts her when her blood sugars is high or low. She denies any highs or lows. The highest she has seen is 242 and she knew how to correct it. She likes having the freestyle libre for monitoring of her blood sugars;       Reviewed scheduled/upcoming provider appointments including: 06-20-2022;         Advised patient, providing education and rationale, to check cbg when you have symptoms of low or high blood sugar and has freestyle libre  and record. States her blood sugars this am was 111. She says they are staying in range well. Will continue to monitor.  call provider for findings outside established parameters;       Review of patient status, including review of consultants reports, relevant laboratory and other test results, and medications completed;       Advised patient to discuss changes in DM and diabetes health  with provider;      Screening for signs and symptoms of depression related to chronic disease state;        Assessed social determinant of health barriers;         Symptom Management: Take medications as prescribed   Attend all scheduled provider appointments Call pharmacy for medication refills 3-7 days in advance of running out of medications Call provider office for new concerns or questions  call the Suicide and Crisis Lifeline: 988 call the [place] National Suicide Prevention Lifeline: 450-397-6915 or TTY: 301-792-6846 TTY 217-715-8956)  to talk to a trained counselor call 1-800-273-TALK (toll free, 24 hour hotline) if experiencing a Mental Health or [place]  check feet daily for cuts, sores or redness trim toenails straight across manage portion size wash and dry feet carefully every day wear comfortable, cotton socks wear comfortable, well-fitting shoes  Follow Up Plan: Telephone follow up appointment with care management team member scheduled for: 06-20-2022 at 4 am       CCM Expected Outcome:  Monitor, Self-Manage and Reduce Symptoms of CAD       Current Barriers:  Chronic Disease Management support and education needs related to effective management of CAD BP Readings from Last 3 Encounters:  05/18/22 103/65  05/02/22 120/60  04/24/22 (!) 132/50     Planned Interventions: Assessed understanding of CAD diagnosis. The patient states that she is much better and having a good day today. Saw the cardiologist recently and got a good report. Denies any new concerns with CAD or heart health. Blood pressures are more stable. Education and support given.  Medications reviewed including medications utilized in CAD treatment plan. Is compliant with medications. Denies any new concerns with medications.  Provided education on importance of blood pressure control in management of CAD. Her blood pressures are stable. She denies any acute findings with changes in her blood pressures. ; Provided education on Importance of limiting foods high in cholesterol. Has lost weight, eats healthy, does not eat between meals.; Counseled on importance of regular laboratory monitoring as prescribed. The patient has regular lab work.; Reviewed Importance of taking all medications as prescribed. Takes medications as prescribed.  Reviewed Importance of attending all scheduled provider appointments. Next appointment with pcp is 05-18-2022- reminder given today.  Advised to report any changes in symptoms or exercise tolerance Advised  patient to discuss changes in CAD or heart health with provider; Screening for signs and symptoms of depression related to chronic disease state;  Assessed social determinant of health barriers;   Symptom Management: Take medications as prescribed   Attend all scheduled provider appointments Call provider office for new concerns or questions  call the Suicide and Crisis Lifeline: 988 call the [place] National Suicide Prevention Lifeline: (412)251-4520 or TTY: 6407014210 TTY 365-255-2755) to talk to a trained counselor call 1-800-273-TALK (toll free, 24 hour hotline) if experiencing a Mental Health or [place]   Follow Up Plan: Telephone follow up appointment with care management team member scheduled for: 06-20-2022 at 38 am          Plan:Telephone follow up appointment with care management team member scheduled for:  06-20-2022 at 67 am  [place], MSN, CCM RN Care Manager  Chronic Care Management Direct Number: [phone]

## 2022-06-02 ENCOUNTER — Other Ambulatory Visit: Payer: Self-pay

## 2022-06-02 NOTE — Telephone Encounter (Signed)
Requested medication (s) are due for refill today: yes  Requested medication (s) are on the active medication list: yes  Last refill:  04/25/22  Future visit scheduled: yes  Notes to clinic:   Medication not assigned to a protocol, review manually.      Requested Prescriptions  Pending Prescriptions Disp Refills   tirzepatide (MOUNJARO) 12.5 MG/0.5ML Pen 6 mL 1    Sig: Inject 12.5 mg into the skin once a week.     Off-Protocol Failed - 06/01/2022  5:03 PM      Failed - Medication not assigned to a protocol, review manually.      Passed - Valid encounter within last 12 months    Recent Outpatient Visits           2 weeks ago Bartholin gland cyst   [place], [place], DO   1 month ago Uncontrolled type 2 diabetes mellitus with hyperglycemia, with long-term current use of insulin ([place])   [place], Megan P, DO   2 months ago Uncontrolled type 2 diabetes mellitus with hyperglycemia, with long-term current use of insulin [facility])   [place], Megan P, DO   2 months ago Influenza A with pneumonia   [place] [facility], Megan P, DO   3 months ago [place], DO       Future Appointments             In 2 weeks Wynetta Emery, Barb Merino, DO [place], [facility]

## 2022-06-08 ENCOUNTER — Other Ambulatory Visit: Payer: Self-pay | Admitting: General Surgery

## 2022-06-08 DIAGNOSIS — Z1231 Encounter for screening mammogram for malignant neoplasm of breast: Secondary | ICD-10-CM

## 2022-06-11 DIAGNOSIS — I2510 Atherosclerotic heart disease of native coronary artery without angina pectoris: Secondary | ICD-10-CM | POA: Diagnosis not present

## 2022-06-11 DIAGNOSIS — I700 Atherosclerosis of aorta: Secondary | ICD-10-CM | POA: Diagnosis not present

## 2022-06-11 DIAGNOSIS — J9601 Acute respiratory failure with hypoxia: Secondary | ICD-10-CM | POA: Diagnosis not present

## 2022-06-11 DIAGNOSIS — I129 Hypertensive chronic kidney disease with stage 1 through stage 4 chronic kidney disease, or unspecified chronic kidney disease: Secondary | ICD-10-CM | POA: Diagnosis not present

## 2022-06-11 DIAGNOSIS — E875 Hyperkalemia: Secondary | ICD-10-CM | POA: Diagnosis not present

## 2022-06-11 DIAGNOSIS — G8929 Other chronic pain: Secondary | ICD-10-CM | POA: Diagnosis not present

## 2022-06-11 DIAGNOSIS — Z6833 Body mass index (BMI) 33.0-33.9, adult: Secondary | ICD-10-CM | POA: Diagnosis not present

## 2022-06-11 DIAGNOSIS — K5790 Diverticulosis of intestine, part unspecified, without perforation or abscess without bleeding: Secondary | ICD-10-CM | POA: Diagnosis not present

## 2022-06-11 DIAGNOSIS — J439 Emphysema, unspecified: Secondary | ICD-10-CM | POA: Diagnosis not present

## 2022-06-11 DIAGNOSIS — C50919 Malignant neoplasm of unspecified site of unspecified female breast: Secondary | ICD-10-CM | POA: Diagnosis not present

## 2022-06-11 DIAGNOSIS — N1832 Chronic kidney disease, stage 3b: Secondary | ICD-10-CM | POA: Diagnosis not present

## 2022-06-11 DIAGNOSIS — Z79891 Long term (current) use of opiate analgesic: Secondary | ICD-10-CM | POA: Diagnosis not present

## 2022-06-11 DIAGNOSIS — G9341 Metabolic encephalopathy: Secondary | ICD-10-CM | POA: Diagnosis not present

## 2022-06-11 DIAGNOSIS — Z7951 Long term (current) use of inhaled steroids: Secondary | ICD-10-CM | POA: Diagnosis not present

## 2022-06-11 DIAGNOSIS — E1159 Type 2 diabetes mellitus with other circulatory complications: Secondary | ICD-10-CM

## 2022-06-11 DIAGNOSIS — Z794 Long term (current) use of insulin: Secondary | ICD-10-CM

## 2022-06-11 DIAGNOSIS — Z7901 Long term (current) use of anticoagulants: Secondary | ICD-10-CM | POA: Diagnosis not present

## 2022-06-11 DIAGNOSIS — K760 Fatty (change of) liver, not elsewhere classified: Secondary | ICD-10-CM | POA: Diagnosis not present

## 2022-06-11 DIAGNOSIS — Z86711 Personal history of pulmonary embolism: Secondary | ICD-10-CM | POA: Diagnosis not present

## 2022-06-11 DIAGNOSIS — E8809 Other disorders of plasma-protein metabolism, not elsewhere classified: Secondary | ICD-10-CM | POA: Diagnosis not present

## 2022-06-11 DIAGNOSIS — Z8701 Personal history of pneumonia (recurrent): Secondary | ICD-10-CM | POA: Diagnosis not present

## 2022-06-11 DIAGNOSIS — E1151 Type 2 diabetes mellitus with diabetic peripheral angiopathy without gangrene: Secondary | ICD-10-CM | POA: Diagnosis not present

## 2022-06-11 DIAGNOSIS — E669 Obesity, unspecified: Secondary | ICD-10-CM | POA: Diagnosis not present

## 2022-06-11 DIAGNOSIS — E1122 Type 2 diabetes mellitus with diabetic chronic kidney disease: Secondary | ICD-10-CM | POA: Diagnosis not present

## 2022-06-12 DIAGNOSIS — G8929 Other chronic pain: Secondary | ICD-10-CM | POA: Diagnosis not present

## 2022-06-12 DIAGNOSIS — G9341 Metabolic encephalopathy: Secondary | ICD-10-CM | POA: Diagnosis not present

## 2022-06-12 DIAGNOSIS — I129 Hypertensive chronic kidney disease with stage 1 through stage 4 chronic kidney disease, or unspecified chronic kidney disease: Secondary | ICD-10-CM | POA: Diagnosis not present

## 2022-06-12 DIAGNOSIS — E1122 Type 2 diabetes mellitus with diabetic chronic kidney disease: Secondary | ICD-10-CM | POA: Diagnosis not present

## 2022-06-12 DIAGNOSIS — E1151 Type 2 diabetes mellitus with diabetic peripheral angiopathy without gangrene: Secondary | ICD-10-CM | POA: Diagnosis not present

## 2022-06-12 DIAGNOSIS — J439 Emphysema, unspecified: Secondary | ICD-10-CM | POA: Diagnosis not present

## 2022-06-16 ENCOUNTER — Ambulatory Visit: Payer: Medicare Other | Admitting: Family Medicine

## 2022-06-20 ENCOUNTER — Encounter: Payer: Self-pay | Admitting: Family Medicine

## 2022-06-20 ENCOUNTER — Ambulatory Visit (INDEPENDENT_AMBULATORY_CARE_PROVIDER_SITE_OTHER): Payer: Medicare Other

## 2022-06-20 ENCOUNTER — Telehealth: Payer: Medicare Other

## 2022-06-20 ENCOUNTER — Ambulatory Visit (INDEPENDENT_AMBULATORY_CARE_PROVIDER_SITE_OTHER): Payer: Medicare Other | Admitting: Family Medicine

## 2022-06-20 VITALS — BP 105/69 | HR 77 | Temp 97.8°F | Wt 196.4 lb

## 2022-06-20 DIAGNOSIS — M549 Dorsalgia, unspecified: Secondary | ICD-10-CM

## 2022-06-20 DIAGNOSIS — E1165 Type 2 diabetes mellitus with hyperglycemia: Secondary | ICD-10-CM

## 2022-06-20 DIAGNOSIS — G8929 Other chronic pain: Secondary | ICD-10-CM

## 2022-06-20 DIAGNOSIS — E1151 Type 2 diabetes mellitus with diabetic peripheral angiopathy without gangrene: Secondary | ICD-10-CM | POA: Diagnosis not present

## 2022-06-20 DIAGNOSIS — I25118 Atherosclerotic heart disease of native coronary artery with other forms of angina pectoris: Secondary | ICD-10-CM

## 2022-06-20 LAB — MICROALBUMIN, URINE WAIVED
Creatinine, Urine Waived: 200 mg/dL (ref 10–300)
Microalb, Ur Waived: 150 mg/L — ABNORMAL HIGH (ref 0–19)

## 2022-06-20 LAB — BAYER DCA HB A1C WAIVED: HB A1C (BAYER DCA - WAIVED): 6.5 % — ABNORMAL HIGH (ref 4.8–5.6)

## 2022-06-20 MED ORDER — GABAPENTIN 300 MG PO CAPS: 600.0000 mg | ORAL_CAPSULE | Freq: Three times a day (TID) | ORAL | 1 refills | Status: DC

## 2022-06-20 NOTE — Assessment & Plan Note (Signed)
Doing great with A1c of 6.5. Continue current regimen. Continue to monitor. Call with any concerns.  ?

## 2022-06-20 NOTE — Patient Instructions (Signed)
Please call the care guide team at [phone] if you need to cancel or reschedule your appointment.   If you are experiencing a Mental Health or Behavioral Health Crisis or need someone to talk to, please call the Suicide and Crisis Lifeline: 988 call the [place] National Suicide Prevention Lifeline: 228-380-50881-(219)755-8591 or TTY: 44504410661-800-799-4 TTY (508) 470-4858(1-6570616281) to talk to a trained counselor call 1-800-273-TALK (toll free, 24 hour hotline)   Following is a copy of the CCM Program Consent:  CCM service includes personalized support from designated clinical staff supervised by the physician, including individualized plan of care and coordination with other care providers 24/7 contact phone numbers for assistance for urgent and routine care needs. Service will only be billed when office clinical staff spend 20 minutes or more in a month to coordinate care. Only one practitioner may furnish and bill the service in a calendar month. The patient may stop CCM services at amy time (effective at the end of the month) by phone call to the office staff. The patient will be responsible for cost sharing (co-pay) or up to 20% of the service fee (after annual deductible is met)  Following is a copy of your full provider care plan:   Goals Addressed             This Visit's Progress    CCM (DIABETES) EXPECTED OUTCOME: MONITOR, SELF-MANAGE AND REDUCE SYMPTOMS OF DIABETES       Current Barriers:  Knowledge Deficits related to how to effectively manage and maintain DM and goal of A1C <8% Chronic Disease Management support and education needs related to effective management DM Goal set by the provider <8.1% Lab Results  Component Value Date   HGBA1C 7.4 (H) 03/16/2022     Planned Interventions: Provided education to patient about basic DM disease process. Education provided; Reviewed medications with patient and discussed importance of medication adherence. The patient is compliant with medications. The  patient states [facility] and is thankful for the resource. Has her medications now. Denies any acute changes at this time.  Reviewed prescribed diet with patient heart healthy/ADA diet. The patient is doing well and says that she has lost weight. She is eating but not snacking like she was. She will drink boost every now and then to help with protein intake. Education and support given.  Counseled on importance of regular laboratory monitoring as prescribed. Labs are up to date. Denies any acute findings. A1C is stable but trending down.;        Discussed plans with patient for ongoing care management follow up and provided patient with direct contact information for care management team;      Provided patient with written educational materials related to hypo and hyperglycemia and importance of correct treatment. Review of how to effectively manage drops in blood sugars or highs. The [facility] and alerts her when her blood sugars is high or low. She denies any highs or lows. She likes having the freestyle libre for monitoring of her blood sugars;       Reviewed scheduled/upcoming provider appointments including: 06-20-2022, will see the pcp this afternoon. Wants to talk to the pcp today about her back pain and discomfort and what she can do about it to help her. Other than that she is feeling great;         Advised patient, providing education and rationale, to check cbg when you have symptoms  of low or high blood sugar and has freestyle libre  and record. States her blood sugars are really good. She says they are staying in range well. Will continue to monitor.  call provider for findings outside established parameters;       Review of patient status, including review of consultants reports, relevant laboratory and other test results, and medications completed;       Advised patient to discuss changes in DM and  diabetes health with provider;      Screening for signs and symptoms of depression related to chronic disease state;        Assessed social determinant of health barriers;         Symptom Management: Take medications as prescribed   Attend all scheduled provider appointments Call pharmacy for medication refills 3-7 days in advance of running out of medications Call provider office for new concerns or questions  call the Suicide and Crisis Lifeline: 988 call the [place] National Suicide Prevention Lifeline: (774) 562-0931 or TTY: 539 117 1097 TTY (210)827-5460) to talk to a trained counselor call 1-800-273-TALK (toll free, 24 hour hotline) if experiencing a Mental Health or Behavioral Health Crisis  check feet daily for cuts, sores or redness trim toenails straight across manage portion size wash and dry feet carefully every day wear comfortable, cotton socks wear comfortable, well-fitting shoes  Follow Up Plan: Telephone follow up appointment with care management team member scheduled for: 08-08-2022 at 1030 am       CCM Expected Outcome:  Monitor, Self-Manage and Reduce Symptoms of CAD       Current Barriers:  Chronic Disease Management support and education needs related to effective management of CAD BP Readings from Last 3 Encounters:  05/18/22 103/65  05/02/22 120/60  04/24/22 (!) 132/50     Planned Interventions: Assessed understanding of CAD diagnosis. The patient states that she is much better and having a good day today. Saw the cardiologist recently and got a good report. Denies any new concerns with CAD or heart health. Blood pressures are more stable. Education and support given. The patient feels she is doing well. She denies any new concerns with her CAD or heart health Medications reviewed including medications utilized in CAD treatment plan. Is compliant with medications. Denies any new concerns with medications.  Provided education on importance of blood pressure  control in management of CAD. Her blood pressures are stable. She denies any acute findings with changes in her blood pressures. ; Provided education on Importance of limiting foods high in cholesterol. Has lost weight, eats healthy, does not eat between meals.; Counseled on importance of regular laboratory monitoring as prescribed. The patient has regular lab work.; Reviewed Importance of taking all medications as prescribed. Takes medications as prescribed.  Reviewed Importance of attending all scheduled provider appointments. Next appointment with pcp is 06-20-2022- reminder given today.  Advised to report any changes in symptoms or exercise tolerance Advised patient to discuss changes in CAD or heart health with provider; Screening for signs and symptoms of depression related to chronic disease state;  Assessed social determinant of health barriers;   Symptom Management: Take medications as prescribed   Attend all scheduled provider appointments Call provider office for new concerns or questions  call the Suicide and Crisis Lifeline: 988 call the [place] National Suicide Prevention Lifeline: (780)130-6028 or TTY: 902-314-5951 TTY 434-473-9886) to talk to a trained counselor call 1-800-273-TALK (toll free, 24 hour hotline) if experiencing a Mental Health or Behavioral Health Crisis   Follow  Up Plan: Telephone follow up appointment with care management team member scheduled for: 08-08-2022 at 1030 am          Patient verbalizes understanding of instructions and care plan provided today and agrees to view in MyChart. Active MyChart status and patient understanding of how to access instructions and care plan via MyChart confirmed with patient.     Telephone follow up appointment with care management team member scheduled for: 08-08-2022 at 1030 am

## 2022-06-20 NOTE — Chronic Care Management (AMB) (Signed)
Chronic Care Management   CCM RN Visit Note  06/20/2022 Name: Tammy [name] MRN: [id number] DOB: Apr 04, 1945  Subjective: Tammy [name] is a 77 y.o. year old female who is a primary care patient of Dorcas Carrow, DO. The patient was referred to the Chronic Care Management team for assistance with care management needs subsequent to provider initiation of CCM services and plan of care.    Today's Visit:  Engaged with patient by telephone for follow up visit.        Goals Addressed             This Visit's Progress    CCM (DIABETES) EXPECTED OUTCOME: MONITOR, SELF-MANAGE AND REDUCE SYMPTOMS OF DIABETES       Current Barriers:  Knowledge Deficits related to how to effectively manage and maintain DM and goal of A1C <8% Chronic Disease Management support and education needs related to effective management DM Goal set by the provider <8.1% Lab Results  Component Value Date   HGBA1C 7.4 (H) 03/16/2022     Planned Interventions: Provided education to patient about basic DM disease process. Education provided; Reviewed medications with patient and discussed importance of medication adherence. The patient is compliant with medications. The patient states she was able to get the [facility] from the outpatient pharmacy and is thankful for the resource. Has her medications now. Denies any acute changes at this time.  Reviewed prescribed diet with patient heart healthy/ADA diet. The patient is doing well and says that she has lost weight. She is eating but not snacking like she was. She will drink boost every now and then to help with protein intake. Education and support given.  Counseled on importance of regular laboratory monitoring as prescribed. Labs are up to date. Denies any acute findings. A1C is stable but trending down.;        Discussed plans with patient for ongoing care management follow up and provided patient with direct contact information for care management team;      Provided  patient with written educational materials related to hypo and hyperglycemia and importance of correct treatment. Review of how to effectively manage drops in blood sugars or highs. The patient states the [facility] is really helpful and alerts her when her blood sugars is high or low. She denies any highs or lows. She likes having the freestyle libre for monitoring of her blood sugars;       Reviewed scheduled/upcoming provider appointments including: 06-20-2022, will see the pcp this afternoon. Wants to talk to the pcp today about her back pain and discomfort and what she can do about it to help her. Other than that she is feeling great;         Advised patient, providing education and rationale, to check cbg when you have symptoms of low or high blood sugar and has freestyle libre  and record. States her blood sugars are really good. She says they are staying in range well. Will continue to monitor.  call provider for findings outside established parameters;       Review of patient status, including review of consultants reports, relevant laboratory and other test results, and medications completed;       Advised patient to discuss changes in DM and diabetes health with provider;      Screening for signs and symptoms of depression related to chronic disease state;        Assessed social determinant of health barriers;  Symptom Management: Take medications as prescribed   Attend all scheduled provider appointments Call pharmacy for medication refills 3-7 days in advance of running out of medications Call provider office for new concerns or questions  call the Suicide and Crisis Lifeline: 988 call the [place] National Suicide Prevention Lifeline: (250)639-63391-309-630-4151 or TTY: (561)403-48801-800-799-4 TTY 615-009-7639(1-636-236-7278) to talk to a trained counselor call 1-800-273-TALK (toll free, 24 hour hotline) if experiencing a Mental Health or Behavioral Health Crisis  check feet daily for cuts, sores or redness trim  toenails straight across manage portion size wash and dry feet carefully every day wear comfortable, cotton socks wear comfortable, well-fitting shoes  Follow Up Plan: Telephone follow up appointment with care management team member scheduled for: 08-08-2022 at 1030 am       CCM Expected Outcome:  Monitor, Self-Manage and Reduce Symptoms of CAD       Current Barriers:  Chronic Disease Management support and education needs related to effective management of CAD BP Readings from Last 3 Encounters:  05/18/22 103/65  05/02/22 120/60  04/24/22 (!) 132/50     Planned Interventions: Assessed understanding of CAD diagnosis. The patient states that she is much better and having a good day today. Saw the cardiologist recently and got a good report. Denies any new concerns with CAD or heart health. Blood pressures are more stable. Education and support given. The patient feels she is doing well. She denies any new concerns with her CAD or heart health Medications reviewed including medications utilized in CAD treatment plan. Is compliant with medications. Denies any new concerns with medications.  Provided education on importance of blood pressure control in management of CAD. Her blood pressures are stable. She denies any acute findings with changes in her blood pressures. ; Provided education on Importance of limiting foods high in cholesterol. Has lost weight, eats healthy, does not eat between meals.; Counseled on importance of regular laboratory monitoring as prescribed. The patient has regular lab work.; Reviewed Importance of taking all medications as prescribed. Takes medications as prescribed.  Reviewed Importance of attending all scheduled provider appointments. Next appointment with pcp is 06-20-2022- reminder given today.  Advised to report any changes in symptoms or exercise tolerance Advised patient to discuss changes in CAD or heart health with provider; Screening for signs and symptoms  of depression related to chronic disease state;  Assessed social determinant of health barriers;   Symptom Management: Take medications as prescribed   Attend all scheduled provider appointments Call provider office for new concerns or questions  call the Suicide and Crisis Lifeline: 988 call the [place] National Suicide Prevention Lifeline: (541) 305-52831-309-630-4151 or TTY: 406-156-42241-800-799-4 TTY (813)691-3401(1-636-236-7278) to talk to a trained counselor call 1-800-273-TALK (toll free, 24 hour hotline) if experiencing a Mental Health or Behavioral Health Crisis   Follow Up Plan: Telephone follow up appointment with care management team member scheduled for: 08-08-2022 at 1030 am          Plan:Telephone follow up appointment with care management team member scheduled for:  08-08-2022 at 1030 am  Alto DenverPam Jazmynn Pho RN, MSN, CCM RN Care Manager  Chronic Care Management Direct Number: [phone]

## 2022-06-20 NOTE — Assessment & Plan Note (Signed)
Acting up still. Will increase her gabapentin to 600mg  TID. She would like to see pain management. Referral is in place. Appointment scheduled today.

## 2022-06-20 NOTE — Progress Notes (Signed)
BP 105/69   Pulse 77   Temp 97.8 F (36.6 C) (Oral)   Wt 196 lb 6.4 oz (89.1 kg)   SpO2 95%   BMI 31.70 kg/m    Subjective:    Patient ID: Tammy [name], female    DOB: 07-Aug-1945, 77 y.o.   MRN: [id number]  HPI: RANJIT ROZYCKI is a 77 y.o. female  Chief Complaint  Patient presents with   Diabetes    Patient most recent Diabetic Eye Exam was requested at today's visit.    Back Problem    Patient says she is getting the point to where the pain is so severe to where they point she is doubled over in pain. Patient says the pain is getting to the point of where it is keeping her from special events. Patient says she has tried Gabapentin and Tylenol and it helped her. Patient would like to discuss a prescription with provider.    DIABETES Hypoglycemic episodes:no Polydipsia/polyuria: no Visual disturbance: no Chest pain: no Paresthesias: yes Glucose Monitoring: yes  Accucheck frequency:  continuous Taking Insulin?: yes Blood Pressure Monitoring: not checking Retinal Examination: Up to Date Foot Exam: Up to Date Diabetic Education: Completed Pneumovax: Up to Date Influenza: Up to Date Aspirin: yes  CHRONIC PAIN  Pain control status: uncontrolled Duration: chronic Location: low back Quality: aching, shooting, sharp Current Pain Level: severe Previous Pain Level: moderate Breakthrough pain: yes Benefit from narcotic medications: unsure Interested in weaning off narcotics:N/A   Stool softners/OTC fiber: no  Previous pain specialty evaluation: no Non-narcotic analgesic meds: yes Narcotic contract: N/A   Relevant past medical, surgical, family and social history reviewed and updated as indicated. Interim medical history since our last visit reviewed. Allergies and medications reviewed and updated.  Review of Systems  Constitutional: Negative.   Respiratory: Negative.    Cardiovascular: Negative.   Gastrointestinal: Negative.   Musculoskeletal:  Positive for back pain  and myalgias. Negative for arthralgias, gait problem, joint swelling, neck pain and neck stiffness.  Skin: Negative.   Neurological: Negative.   Psychiatric/Behavioral: Negative.      Per HPI unless specifically indicated above     Objective:    BP 105/69   Pulse 77   Temp 97.8 F (36.6 C) (Oral)   Wt 196 lb 6.4 oz (89.1 kg)   SpO2 95%   BMI 31.70 kg/m   Wt Readings from Last 3 Encounters:  06/20/22 196 lb 6.4 oz (89.1 kg)  05/17/22 199 lb (90.3 kg)  05/02/22 200 lb (90.7 kg)    Physical Exam Vitals and nursing note reviewed.  Constitutional:      General: She is not in acute distress.    Appearance: Normal appearance. She is obese. She is not ill-appearing, toxic-appearing or diaphoretic.  HENT:     Head: Normocephalic and atraumatic.     Right Ear: External ear normal.     Left Ear: External ear normal.     [name]: [name] normal.     Mouth/Throat:     Mouth: Mucous membranes are moist.     Pharynx: Oropharynx is clear.  Eyes:     General: No scleral icterus.       Right eye: No discharge.        Left eye: No discharge.     Extraocular Movements: Extraocular movements intact.     Conjunctiva/sclera: Conjunctivae normal.     Pupils: Pupils are equal, round, and reactive to light.  Cardiovascular:     Rate  and Rhythm: Normal rate and regular rhythm.     Pulses: Normal pulses.     Heart sounds: Normal heart sounds. No murmur heard.    No friction rub. No gallop.  Pulmonary:     Effort: Pulmonary effort is normal. No respiratory distress.     Breath sounds: Normal breath sounds. No stridor. No wheezing, rhonchi or rales.  Chest:     Chest wall: No tenderness.  Musculoskeletal:        General: Normal range of motion.     Cervical back: Normal range of motion and neck supple.  Skin:    General: Skin is warm and dry.     Capillary Refill: Capillary refill takes less than 2 seconds.     Coloration: Skin is not jaundiced or pale.     Findings: No bruising, erythema,  lesion or rash.  Neurological:     General: No focal deficit present.     Mental Status: She is alert and oriented to person, place, and time. Mental status is at baseline.  Psychiatric:        Mood and Affect: Mood normal.        Behavior: Behavior normal.        Thought Content: Thought content normal.        Judgment: Judgment normal.     Results for orders placed or performed in visit on 03/16/22  Bayer DCA Hb A1c Waived  Result Value Ref Range   HB A1C (BAYER DCA - WAIVED) 7.4 (H) 4.8 - 5.6 %  CBC with Differential/Platelet  Result Value Ref Range   WBC 9.3 3.4 - 10.8 x10E3/uL   RBC 4.85 3.77 - 5.28 x10E6/uL   Hemoglobin 13.7 11.1 - 15.9 g/dL   Hematocrit 95.242.9 84.134.0 - 46.6 %   MCV 89 79 - 97 fL   MCH 28.2 26.6 - 33.0 pg   MCHC 31.9 31.5 - 35.7 g/dL   RDW 32.414.1 40.111.7 - 02.715.4 %   Platelets 412 150 - 450 x10E3/uL   Neutrophils 54 Not Estab. %   Lymphs 26 Not Estab. %   Monocytes 12 Not Estab. %   Eos 4 Not Estab. %   Basos 1 Not Estab. %   Neutrophils Absolute 5.1 1.4 - 7.0 x10E3/uL   Lymphocytes Absolute 2.4 0.7 - 3.1 x10E3/uL   Monocytes Absolute 1.1 (H) 0.1 - 0.9 x10E3/uL   EOS (ABSOLUTE) 0.4 0.0 - 0.4 x10E3/uL   Basophils Absolute 0.1 0.0 - 0.2 x10E3/uL   Immature Granulocytes 3 Not Estab. %   Immature Grans (Abs) 0.3 (H) 0.0 - 0.1 x10E3/uL  Comprehensive metabolic panel  Result Value Ref Range   Glucose 85 70 - 99 mg/dL   BUN 20 8 - 27 mg/dL   Creatinine, Ser 2.531.25 (H) 0.57 - 1.00 mg/dL   eGFR 44 (L) >66>59 YQ/IHK/7.42mL/min/1.73   BUN/Creatinine Ratio 16 12 - 28   Sodium 142 134 - 144 mmol/L   Potassium 4.7 3.5 - 5.2 mmol/L   Chloride 102 96 - 106 mmol/L   CO2 25 20 - 29 mmol/L   Calcium 9.2 8.7 - 10.3 mg/dL   Total Protein 6.4 6.0 - 8.5 g/dL   Albumin 3.8 3.8 - 4.8 g/dL   Globulin, Total 2.6 1.5 - 4.5 g/dL   Albumin/Globulin Ratio 1.5 1.2 - 2.2   Bilirubin Total 0.3 0.0 - 1.2 mg/dL   Alkaline Phosphatase 84 44 - 121 IU/L   AST 29 0 - 40 IU/L   ALT 22  0 - 32 IU/L   Lipid Panel w/o Chol/HDL Ratio  Result Value Ref Range   Cholesterol, Total 136 100 - 199 mg/dL   Triglycerides 629 (H) 0 - 149 mg/dL   HDL 46 >52 mg/dL   VLDL Cholesterol Cal 29 5 - 40 mg/dL   LDL Chol Calc (NIH) 61 0 - 99 mg/dL  TSH  Result Value Ref Range   TSH 0.812 0.450 - 4.500 uIU/mL   *Note: Due to a large number of results and/or encounters for the requested time period, some results have not been displayed. A complete set of results can be found in Results Review.      Assessment & Plan:   Problem List Items Addressed This Visit       Cardiovascular and Mediastinum   Type 2 diabetes mellitus with diabetic peripheral angiopathy without gangrene - Primary    Doing great with A1c of 6.5. Continue current regimen. Continue to monitor. Call with any concerns.       Relevant Medications   TRESIBA FLEXTOUCH 100 UNIT/ML FlexTouch Pen   Other Relevant Orders   Microalbumin, Urine Waived   Bayer DCA Hb A1c Waived     Other   Chronic back pain    Acting up still. Will increase her gabapentin to 600mg  TID. She would like to see pain management. Referral is in place. Appointment scheduled today.      Relevant Medications   gabapentin (NEURONTIN) 300 MG capsule     Follow up plan: Return in about 4 weeks (around 07/18/2022) for follow up pain.

## 2022-06-27 DIAGNOSIS — I129 Hypertensive chronic kidney disease with stage 1 through stage 4 chronic kidney disease, or unspecified chronic kidney disease: Secondary | ICD-10-CM | POA: Diagnosis not present

## 2022-06-27 DIAGNOSIS — E1151 Type 2 diabetes mellitus with diabetic peripheral angiopathy without gangrene: Secondary | ICD-10-CM | POA: Diagnosis not present

## 2022-06-27 DIAGNOSIS — J439 Emphysema, unspecified: Secondary | ICD-10-CM | POA: Diagnosis not present

## 2022-06-27 DIAGNOSIS — E1122 Type 2 diabetes mellitus with diabetic chronic kidney disease: Secondary | ICD-10-CM | POA: Diagnosis not present

## 2022-06-27 DIAGNOSIS — G8929 Other chronic pain: Secondary | ICD-10-CM | POA: Diagnosis not present

## 2022-06-27 DIAGNOSIS — G9341 Metabolic encephalopathy: Secondary | ICD-10-CM | POA: Diagnosis not present

## 2022-06-27 NOTE — Progress Notes (Unsigned)
Patient: Tammy [name]  Service Category: E/M  Provider: Oswaldo Done, MD  DOB: 1945-04-08  DOS: 06/28/2022  Referring Provider: Leafy Ro, MD  MRN: [id number]  Setting: Ambulatory outpatient  PCP: Dorcas Carrow, DO  Type: New Patient  Specialty: Interventional Pain Management    Location: Office  Delivery: Face-to-face     Primary Reason(s) for Visit: Encounter for initial evaluation of one or more chronic problems (new to examiner) potentially causing chronic pain, and posing a threat to normal musculoskeletal function. (Level of risk: High) CC: No chief complaint on file.  HPI  Tammy [name] is a 77 y.o. year old, female patient, who comes for the first time to our practice referred by Leafy Ro, MD for our initial evaluation of her chronic pain. She has Coronary atherosclerosis of native coronary artery; Exertional dyspnea; Chronic back pain; History of back surgery; Hyperlipidemia; PAD (peripheral artery disease); Benign hypertensive renal disease; Uncontrolled type 2 diabetes mellitus with hyperglycemia, with long-term current use of insulin; Pulmonary embolism; Coronary artery disease; Essential hypertension; Morbid obesity due to excess calories; Rhinitis, allergic; Congenital obstruction of ureteropelvic junction; Intermittent left-sided chest pain; Abdominal aortic atherosclerosis; Diverticulosis of colon; Constipation; Bilateral atelectasis; Fatty liver disease, nonalcoholic; Hydronephrosis of right kidney; Abdominal aortic ectasia; Overactive bladder; Spinal stenosis of lumbar region; Neuralgia neuritis, sciatic nerve; Herniated nucleus pulposus; Benign neoplasm of sigmoid colon; Benign neoplasm of cecum; Benign neoplasm of ascending colon; Encephalomalacia on imaging study; Calcification of both carotid arteries; Hypothyroid; COPD (chronic obstructive pulmonary disease); Chronic right-sided low back pain with right-sided sciatica; Gait abnormality; Right knee pain; Carcinoma of  lower-inner quadrant of left breast in female, estrogen receptor positive; Dysphagia; Thickening of esophagus; Stomach irritation; Gastric polyp; Positive occult stool blood test; Polyp of colon; Unstable angina; Hyperkalemia; Proteinuria; Diabetes mellitus, type II; Anemia; Asthma; History of cardiac catheterization; Left breast cancer s/p mastectomy; History of total knee arthroplasty; Malignant tumor of breast; Fall; Lung nodule; Atherosclerosis of native arteries of extremity with intermittent claudication; Cervical radiculopathy; Sacroiliac inflammation; Stage 3b chronic kidney disease; Acute respiratory failure with hypoxia; Chronic anticoagulation; Elevated lipase; Osteoarthritis of spine with radiculopathy, cervical region; Osteoarthritis of spine with radiculopathy, thoracic region; Primary cancer of left upper lobe of lung; Acute metabolic encephalopathy; Personal history of pulmonary embolism; Type 2 diabetes mellitus with diabetic peripheral angiopathy without gangrene; Type 2 diabetes mellitus with diabetic chronic kidney disease; Atherosclerosis of aorta; Obesity, unspecified; Long term (current) use of inhaled steroids; Coronary artery disease involving native coronary artery of native heart without angina pectoris; Other disorders of phosphorus metabolism; Other chronic pain; Metabolic encephalopathy; Long term (current) use of anticoagulants; Hypomagnesemia; Hypertensive chronic kidney disease w stg 1-4/unsp chr kdny; Body mass index (BMI) 33.0-33.9, adult; Chronic kidney disease, stage 3b; Dvrtclos of intest, part unsp, w/o perf or abscess w/o bleed; Emphysema, unspecified; and Fatty (change of) liver, not elsewhere classified on their problem list. Today she comes in for evaluation of her No chief complaint on file.  Pain Assessment: Location:     Radiating:   Onset:   Duration:   Quality:   Severity:  /10 (subjective, self-reported pain score)  Effect on ADL:   Timing:   Modifying  factors:   BP:    HR:    Onset and Duration: {Hx; Onset and Duration:210120511} Cause of pain: {Hx; Cause:210120521} Severity: {Pain Severity:210120502} Timing: {Symptoms; Timing:210120501} Aggravating Factors: {Causes; Aggravating pain factors:210120507} Alleviating Factors: {Causes; Alleviating Factors:210120500} Associated Problems: {Hx; Associated problems:210120515} Quality of Pain: {Hx; Symptom quality  or Descriptor:210120531} Previous Examinations or Tests: {Hx; Previous examinations or test:210120529} Previous Treatments: {Hx; Previous Treatment:210120503}  Tammy [name] is being evaluated for possible interventional pain management therapies for the treatment of her chronic pain.   ***  Tammy [name] has been informed that this initial visit was an evaluation only.  On the follow up appointment I will go over the results, including ordered tests and available interventional therapies. At that time she will have the opportunity to decide whether to proceed with offered therapies or not. In the event that Ms. Zeleznik prefers avoiding interventional options, this will conclude our involvement in the case.  Medication management recommendations may be provided upon request.  Historic Controlled Substance Pharmacotherapy Review  PMP and historical list of controlled substances: Gabapentin 300 mg capsule, 6/daily (# 540) (last filled on 06/20/2022); hydrocodone/chloramphenicol ER suspension (# 60) (last filled on 03/01/2022); oxycodone/APAP 5/325, 1 tab p.o. 3 times daily (# 45) (last filled 12/28/2021) Most recently prescribed opioid analgesics:   None MME/day: 0 mg/day  Historical Monitoring: The patient  reports no history of drug use. List of prior UDS Testing: No results found for: "MDMA", "COCAINSCRNUR", "PCPSCRNUR", "PCPQUANT", "CANNABQUANT", "THCU", "ETH", "CBDTHCR", "D8THCCBX", "D9THCCBX" Historical Background Evaluation: [place] PMP: PDMP reviewed during this encounter. Review of the past  78-months conducted.             PMP NARX Score Report:  Narcotic: 210 Sedative: 090 Stimulant: 000 [place] Department of public safety, offender search: Engineer, mining Information) Non-contributory Risk Assessment Profile: Aberrant behavior: None observed or detected today Risk factors for fatal opioid overdose: None identified today PMP NARX Overdose Risk Score: 020 Fatal overdose hazard ratio (HR): Calculation deferred Non-fatal overdose hazard ratio (HR): Calculation deferred Risk of opioid abuse or dependence: 0.7-3.0% with doses ? 36 MME/day and 6.1-26% with doses ? 120 MME/day. Substance use disorder (SUD) risk level: See below Personal History of Substance Abuse (SUD-Substance use disorder):  Alcohol:    Illegal Drugs:    Rx Drugs:    ORT Risk Level calculation:    ORT Scoring interpretation table:  Score <3 = Low Risk for SUD  Score between 4-7 = Moderate Risk for SUD  Score >8 = High Risk for Opioid Abuse   PHQ-2 Depression Scale:  Total score:    PHQ-2 Scoring interpretation table: (Score and probability of major depressive disorder)  Score 0 = No depression  Score 1 = 15.4% Probability  Score 2 = 21.1% Probability  Score 3 = 38.4% Probability  Score 4 = 45.5% Probability  Score 5 = 56.4% Probability  Score 6 = 78.6% Probability   PHQ-9 Depression Scale:  Total score:    PHQ-9 Scoring interpretation table:  Score 0-4 = No depression  Score 5-9 = Mild depression  Score 10-14 = Moderate depression  Score 15-19 = Moderately severe depression  Score 20-27 = Severe depression (2.4 times higher risk of SUD and 2.89 times higher risk of overuse)   Pharmacologic Plan: As per protocol, I have not taken over any controlled substance management, pending the results of ordered tests and/or consults.            Initial impression: Pending review of available data and ordered tests.  Meds   Current Outpatient Medications:    acetaminophen (TYLENOL) 325 MG tablet, Take 2 tablets  (650 mg total) by mouth every 6 (six) hours as needed for mild pain (or Fever >/= 101)., Disp: 20 tablet, Rfl: 0   albuterol (PROVENTIL) (2.5 MG/3ML) 0.083% nebulizer solution,  Take 3 mLs (2.5 mg total) by nebulization every 6 (six) hours as needed for wheezing or shortness of breath., Disp: 225 mL, Rfl: 12   albuterol (VENTOLIN HFA) 108 (90 Base) MCG/ACT inhaler, Inhale 2 puffs into the lungs every 6 (six) hours as needed for wheezing or shortness of breath., Disp: 18 g, Rfl: 3   anastrozole (ARIMIDEX) 1 MG tablet, TAKE 1 TABLET BY MOUTH DAILY, Disp: 90 tablet, Rfl: 1   apixaban (ELIQUIS) 2.5 MG TABS tablet, Take 1 tablet (2.5 mg total) by mouth 2 (two) times daily., Disp: 180 tablet, Rfl: 1   Calcium Carb-Cholecalciferol (CALCIUM 600 + D) 600-200 MG-UNIT TABS, Take 1 tablet by mouth daily. , Disp: , Rfl:    cholecalciferol (VITAMIN D3) 25 MCG (1000 UT) tablet, Take 1,000 Units by mouth daily., Disp: , Rfl:    CRANBERRY PO, Take 2 capsules by mouth daily., Disp: , Rfl:    DULoxetine (CYMBALTA) 60 MG capsule, Take 2 capsules (120 mg total) by mouth daily., Disp: 180 capsule, Rfl: 1   fluticasone (FLONASE) 50 MCG/ACT nasal spray, Place 1 spray into both nostrils daily., Disp: 16 g, Rfl: 2   Fluticasone-Umeclidin-Vilant (TRELEGY ELLIPTA) 100-62.5-25 MCG/ACT AEPB, Inhale 1 puff into the lungs daily., Disp: 3 each, Rfl: 4   gabapentin (NEURONTIN) 300 MG capsule, Take 2 capsules (600 mg total) by mouth 3 (three) times daily., Disp: 540 capsule, Rfl: 1   glipiZIDE (GLUCOTROL) 5 MG tablet, TAKE 1 TABLET BY MOUTH DAILY BEFORE BREAKFAST, Disp: 90 tablet, Rfl: 1   isosorbide mononitrate (IMDUR) 60 MG 24 hr tablet, TAKE ONE TABLET BY MOUTH AT BEDTIME, Disp: 90 tablet, Rfl: 1   Lactobacillus (PROBIOTIC ACIDOPHILUS PO), Take 1 capsule by mouth daily at 12 noon., Disp: , Rfl:    metoprolol tartrate (LOPRESSOR) 25 MG tablet, Take 1 tablet (25 mg total) by mouth 2 (two) times daily., Disp: 180 tablet, Rfl: 1    montelukast (SINGULAIR) 10 MG tablet, Take 1 tablet (10 mg total) by mouth at bedtime., Disp: 90 tablet, Rfl: 1   MYRBETRIQ 50 MG TB24 tablet, TAKE 1 TABLET BY MOUTH DAILY, Disp: 30 tablet, Rfl: 2   nitroGLYCERIN (NITROSTAT) 0.4 MG SL tablet, Place 1 tablet (0.4 mg total) under the tongue every 5 (five) minutes as needed for chest pain. Total of 3 doses, Disp: 25 tablet, Rfl: 6   ondansetron (ZOFRAN) 4 MG tablet, Take 1 tablet (4 mg total) by mouth every 6 (six) hours as needed for nausea., Disp: 20 tablet, Rfl: 0   oxyCODONE-acetaminophen (PERCOCET/ROXICET) 5-325 MG tablet, TAKE 1/2 TO 1 TABLET BY MOUTH EVERY 8 HOURS AS NEEDED FOR SEVERE PAIN, Disp: 45 tablet, Rfl: 0   pantoprazole (PROTONIX) 40 MG tablet, TAKE 1 TABLET BY MOUTH DAILY, Disp: 90 tablet, Rfl: 1   Pumpkin Seed-Soy Germ (AZO BLADDER CONTROL/GO-LESS PO), Take 1 tablet by mouth in the morning and at bedtime., Disp: , Rfl:    rosuvastatin (CRESTOR) 40 MG tablet, Take 1 tablet (40 mg total) by mouth daily., Disp: 90 tablet, Rfl: 1   SYNTHROID 150 MCG tablet, Take 1 tablet (150 mcg total) by mouth daily before breakfast., Disp: 90 tablet, Rfl: 3   tirzepatide (MOUNJARO) 12.5 MG/0.5ML Pen, Inject 12.5 mg into the skin once a week., Disp: 6 mL, Rfl: 1   TRESIBA FLEXTOUCH 100 UNIT/ML FlexTouch Pen, Inject into the skin., Disp: , Rfl:    trimethoprim (TRIMPEX) 100 MG tablet, TAKE ONE TABLET BY MOUTH EVERY DAY, Disp: 90 tablet, Rfl: 1  UNABLE TO FIND, Med Name: Energy, memory and mood enhancer, Disp: , Rfl:    ZINC CITRATE PO, Take 1 tablet by mouth daily., Disp: , Rfl:   Imaging Review  Cervical Imaging: Cervical MR wo contrast: Results for orders placed during the hospital encounter of 11/15/21 MR Cervical Spine Wo Contrast  Narrative CLINICAL DATA:  Osteoarthritis of spine with radiculopathy, cervical region M47.22 (ICD-10-CM).  Osteoarthritis of spine with radiculopathy, thoracic region M47.24 (ICD-10-CM); Other intervertebral disc  degeneration, thoracic region M51.34 (ICD-10-CM).  EXAM: MRI CERVICAL AND THORACIC SPINE WITHOUT CONTRAST  TECHNIQUE: Multiplanar and multiecho pulse sequences of the cervical spine, to include the craniocervical junction and cervicothoracic junction, and the thoracic spine, were obtained without intravenous contrast.  COMPARISON:  MRI cervical spine October 19, 2015.  Radiographs August 23, 2021.  FINDINGS: MRI CERVICAL SPINE FINDINGS  Alignment: Small anterolisthesis of C3 over C4 and C4 over C5. Exaggerated upper cervical lordosis.  Vertebrae: No fracture, evidence of discitis, or bone lesion.  Cord: Mild mass effect on the cord at C5-6 and C6-7. No cord signal abnormality.  Posterior Fossa, vertebral arteries, paraspinal tissues: Retro odontoid mild degenerative changes with small right posterolateral synovial cyst projecting into the spinal canal and causing mild indentation on the thecal sac.  Disc levels: Degree of spinal canal or neural foraminal stenosis has not significantly changed compared to prior MRI, as detailed below.  C2-3: Facet degenerative changes. No significant spinal canal or neural foraminal stenosis.  C3-4: Mild disc bulge without significant spinal canal stenosis. Uncovertebral and facet degenerative changes resulting in moderate bilateral neural foraminal narrowing.  C4-5: Mild disc bulge without significant spinal canal stenosis. Uncovertebral and facet degenerative changes with prominent left facet hypertrophy resulting in moderate bilateral neural foraminal narrowing, left greater than right.  C5-6: Posterior disc osteophyte complex, asymmetric to the right, resulting in mild spinal canal stenosis. Uncovertebral and facet degenerative changes resulting in severe right and mild left neural foraminal narrowing.  C6-7: Posterior disc osteophyte complex resulting in mild spinal canal stenosis. Uncovertebral and facet degenerative  changes resulting in mild right and moderate left neural foraminal narrowing.  C7-T1: No spinal canal or neural foraminal stenosis.  MRI THORACIC SPINE FINDINGS  Alignment:  Normal.  Vertebrae: No fracture, evidence of discitis, or bone lesion.  Cord:  Normal signal and morphology.  Paraspinal and other soft tissues: The lower thoracic/upper abdominal aorta measures approximately 3.5 cm.  Disc levels:  No significant disc bulge or herniation, spinal canal or neural foraminal stenosis at any level.  Spinal cord stimulator is seen entering the spinal canal posteriorly at the T10-11 space extending into the T8-9 level.  IMPRESSION: 1. Degenerative changes of the cervical spine with mild spinal canal stenosis at C5-6 and C6-7. 2. Multilevel neural foraminal narrowing of the cervical spine, severe on the right at C5-6, moderate bilaterally at C3-4 and C4-5 and on the left at C6-7. 3. No significant spinal canal or neural foraminal stenosis of the thoracic spine. 4. Aortic lower thoracic/upper abdominal aneurysm measuring up to 3.5 cm, similar to CT of the abdomen obtained March 22, 2021.   Electronically Signed By: Baldemar Lenis M.D. On: 11/16/2021 17:25  Cervical DG complete: Results for orders placed during the hospital encounter of 09/30/20 DG Cervical Spine Complete  Narrative CLINICAL DATA:  Acute left-sided thoracic back pain. Left shoulder pain. History of cervical disc disease.  EXAM: CERVICAL SPINE - COMPLETE 4+ VIEW  COMPARISON:  None.  FINDINGS: Trace anterolisthesis of C3  on C4. Slight broad-based rightward curvature. There is multilevel endplate spurring with associated disc space narrowing at C5-C6 and C6-C7. Mild to moderate multilevel facet hypertrophy. No evidence of fracture, focal lesion or bone destruction. Vertebral body heights are normal. No prevertebral soft tissue edema.  IMPRESSION: 1. Multilevel degenerative disc  disease and facet hypertrophy throughout the cervical spine. 2. Trace anterolisthesis of C3 on C4, likely degenerative.   Electronically Signed By: Narda Rutherford M.D. On: 10/01/2020 19:53  Shoulder Imaging: Shoulder-L DG: Results for orders placed during the hospital encounter of 09/30/20 DG Shoulder Left  Narrative CLINICAL DATA:  Acute left-sided thoracic back pain. Left shoulder pain. Pain below left scapula.  EXAM: LEFT SHOULDER - 2+ VIEW  COMPARISON:  None.  FINDINGS: There is no evidence of fracture or dislocation. Mild acromioclavicular spurring with inferiorly directed osteophytes. Minimal glenohumeral spurring. Glenohumeral joint space is preserved. No erosion, bony destruction, or evidence of focal lesion. Soft tissues are unremarkable.  IMPRESSION: Mild acromioclavicular and glenohumeral osteoarthritis.   Electronically Signed By: Narda Rutherford M.D. On: 10/01/2020 19:50  Thoracic Imaging: Thoracic MR wo contrast: Results for orders placed during the hospital encounter of 11/15/21 MR Thoracic Spine Wo Contrast  Narrative CLINICAL DATA:  Osteoarthritis of spine with radiculopathy, cervical region M47.22 (ICD-10-CM).  Osteoarthritis of spine with radiculopathy, thoracic region M47.24 (ICD-10-CM); Other intervertebral disc degeneration, thoracic region M51.34 (ICD-10-CM).  EXAM: MRI CERVICAL AND THORACIC SPINE WITHOUT CONTRAST  TECHNIQUE: Multiplanar and multiecho pulse sequences of the cervical spine, to include the craniocervical junction and cervicothoracic junction, and the thoracic spine, were obtained without intravenous contrast.  COMPARISON:  MRI cervical spine October 19, 2015.  Radiographs August 23, 2021.  FINDINGS: MRI CERVICAL SPINE FINDINGS  Alignment: Small anterolisthesis of C3 over C4 and C4 over C5. Exaggerated upper cervical lordosis.  Vertebrae: No fracture, evidence of discitis, or bone lesion.  Cord: Mild mass effect  on the cord at C5-6 and C6-7. No cord signal abnormality.  Posterior Fossa, vertebral arteries, paraspinal tissues: Retro odontoid mild degenerative changes with small right posterolateral synovial cyst projecting into the spinal canal and causing mild indentation on the thecal sac.  Disc levels: Degree of spinal canal or neural foraminal stenosis has not significantly changed compared to prior MRI, as detailed below.  C2-3: Facet degenerative changes. No significant spinal canal or neural foraminal stenosis.  C3-4: Mild disc bulge without significant spinal canal stenosis. Uncovertebral and facet degenerative changes resulting in moderate bilateral neural foraminal narrowing.  C4-5: Mild disc bulge without significant spinal canal stenosis. Uncovertebral and facet degenerative changes with prominent left facet hypertrophy resulting in moderate bilateral neural foraminal narrowing, left greater than right.  C5-6: Posterior disc osteophyte complex, asymmetric to the right, resulting in mild spinal canal stenosis. Uncovertebral and facet degenerative changes resulting in severe right and mild left neural foraminal narrowing.  C6-7: Posterior disc osteophyte complex resulting in mild spinal canal stenosis. Uncovertebral and facet degenerative changes resulting in mild right and moderate left neural foraminal narrowing.  C7-T1: No spinal canal or neural foraminal stenosis.  MRI THORACIC SPINE FINDINGS  Alignment:  Normal.  Vertebrae: No fracture, evidence of discitis, or bone lesion.  Cord:  Normal signal and morphology.  Paraspinal and other soft tissues: The lower thoracic/upper abdominal aorta measures approximately 3.5 cm.  Disc levels:  No significant disc bulge or herniation, spinal canal or neural foraminal stenosis at any level.  Spinal cord stimulator is seen entering the spinal canal posteriorly at the T10-11 space extending  into the T8-9  level.  IMPRESSION: 1. Degenerative changes of the cervical spine with mild spinal canal stenosis at C5-6 and C6-7. 2. Multilevel neural foraminal narrowing of the cervical spine, severe on the right at C5-6, moderate bilaterally at C3-4 and C4-5 and on the left at C6-7. 3. No significant spinal canal or neural foraminal stenosis of the thoracic spine. 4. Aortic lower thoracic/upper abdominal aneurysm measuring up to 3.5 cm, similar to CT of the abdomen obtained March 22, 2021.   Electronically Signed By: Baldemar Lenis M.D. On: 11/16/2021 17:25  Thoracic DG w/swimmers view: Results for orders placed during the hospital encounter of 09/30/20 [facility] Thoracic Spine W/Swimmers  Narrative CLINICAL DATA:  Acute left-sided thoracic back pain. Left shoulder pain.  EXAM: THORACIC SPINE - 3 VIEWS  COMPARISON:  None.  FINDINGS: Spinal stimulator is in place with leads at the level of T8-T9. The alignment is maintained. Vertebral body heights are maintained. Mild disc space narrowing and endplate spurring in the mid lower thoracic spine, most prominent at T10-T11. There is no evidence of fracture, focal bone lesion or bone destruction. Posterior elements appear intact. There is no paravertebral soft tissue abnormality.  IMPRESSION: 1. Spinal stimulator in place with lead tips at the level of T8-T9. 2. Mild degenerative disc disease in the mid and lower thoracic spine.   Electronically Signed By: Narda Rutherford M.D. On: 10/01/2020 19:49  Lumbosacral Imaging: Lumbar MR wo contrast: Results for orders placed during the hospital encounter of 04/17/17 MR Lumbar Spine Wo Contrast  Narrative CLINICAL DATA:  History of back surgery. Spinal stenosis of the lumbar region with recurrent radiculopathy. Low back pain extending into both lower extremities, right greater than left.  EXAM: MRI LUMBAR SPINE WITHOUT CONTRAST  TECHNIQUE: Multiplanar, multisequence MR  imaging of the lumbar spine was performed. No intravenous contrast was administered.  COMPARISON:  CT abdomen and pelvis 04/08/2015 MRI lumbar spine 10/04/2011.  FINDINGS: Segmentation: 5 non rib-bearing lumbar type vertebral bodies are present.  Alignment: Slight anterolisthesis is again noted at L4-5. Previous retrolisthesis at L3-4 was reduced. Slight retrolisthesis at L2-3 is stable.  Vertebrae: Marrow signal is within normal limits following extensive posterior lumbar fusion.  Conus medullaris and cauda equina: Conus extends to the L1-2 level. Conus and cauda equina appear normal.  Paraspinal and other soft tissues: Chronic right-sided renal atrophy and hydronephrosis is stable. The left kidney is within normal limits. No other focal lesions are present. No significant adenopathy is present.  Disc levels:  L1-2: Moderate facet hypertrophy is present bilaterally. There is no significant disc protrusion or stenosis.  L2-3: Discectomy and fusion are noted. Posterior pedicle screws are in place. No residual recurrent stenosis is evident.  L3-4: Discectomy and fusion are noted. No residual recurrent stenosis is present. Foramina are patent bilaterally.  L4-5: Discectomy and fusion are noted. Left laminectomy is present. No residual recurrent stenosis is present. The foramina are patent bilaterally.  L5-S1: Discectomy and fusion are noted. Left laminectomy is present. There is no residual recurrent stenosis.  IMPRESSION: 1. Interval lumbar fusion at L2-3, L3-4, L4-5, and L5-S1 without residual or recurrent stenosis at these levels. 2. Mild facet hypertrophy at L1-2 is not significantly changed. There is no significant disc protrusion or stenosis at this level. 3. No significant residual or recurrent spinal stenosis to explain the patient's radicular symptoms. 4. Chronic right sided hydronephrosis with parenchymal atrophy.   Electronically Signed By: Marin Roberts M.D. On: 04/17/2017 15:20  Lumbar  CT wo contrast: Results for orders placed during the hospital encounter of 02/14/19 CT LUMBAR SPINE WO CONTRAST  Narrative CLINICAL DATA:  Low back pain after standing for extended periods of time  EXAM: CT LUMBAR SPINE WITHOUT CONTRAST  TECHNIQUE: Multidetector CT imaging of the lumbar spine was performed without intravenous contrast administration. Multiplanar CT image reconstructions were also generated.  COMPARISON:  MRI 04/17/2017, CT 04/08/2015, report 01/31/2019  FINDINGS: Segmentation: 5 lumbar type vertebrae.  Alignment: Trace anterolisthesis L4 on L5 without significant change. Alignment is otherwise within normal limits.  Vertebrae: Minimal loss of height anteriorly of L4, appears chronic to 2019 MRI. No acute fracture is visualized.  Paraspinal and other soft tissues: Proximal suprarenal abdominal aortic aneurysm measuring up to 3.5 cm on coronal views. Chronic hydronephrosis right kidney. Mild hydronephrosis left kidney. Sigmoid colon diverticular disease. Mild fatty atrophy of the paraspinous muscles. Incompletely visualized spinal stimulator leads at T11.  Disc levels:  At T12-L1, maintained disc space. No canal stenosis. Foramen are patent bilaterally.  At L1-L2, maintained disc space. No significant canal stenosis. Foramen are patent bilaterally. Mild facet degenerative changes.  At L2-L3, interbody device with posterior fusion rod. Pedicle screws appear in place. No lucency. Some flattening of the thecal sac without high-grade canal stenosis. Facet degenerative changes. Foramen appear patent bilaterally.  At L3-L4, interbody device with posterior fusion rods and pedicle screws. Screws remain in place. No lucency. No high-grade canal stenosis. Some flattening of the anterior thecal sac. Facet degenerative changes. Foramen appear grossly patent.  At L4-L5, interbody device. No bridging spinal rod. Pedicle  screws remain and pays. No surrounding lucency. No high-grade canal stenosis. Mild foraminal narrowing bilaterally. Facet degenerative changes.  At L5-S1, interbody device. Posterior rod and fixating screws without lucency. No high-grade canal stenosis. The foramen appear mildly narrowed.  IMPRESSION: 1. Prior lumbar fusion with posterior spinal rods and transpedicular screws L2 through L4 and L5 through S1 with non contiguous appearance of the posterior spinal rods at L4-L5. Hardware appears grossly intact, no definitive lucency to suggest loosening. 2. Stable lumbar alignment with trace anterolisthesis L4 on L5. No high-grade canal stenosis or foraminal narrowing by CT. 3. Suprarenal abdominal aortic aneurysm up to 3.5 cm. Recommend followup by ultrasound in 2 years. This recommendation follows ACR consensus guidelines: White Paper of the ACR Incidental Findings Committee II on Vascular Findings. J Am Coll Radiol 2013; 10:789-794. Aortic aneurysm NOS (ICD10-I71.9) 4. Chronic right hydronephrosis. 5. Sigmoid colon diverticular disease without acute inflammatory change   Electronically Signed By: Jasmine Pang M.D. On: 02/14/2019 15:41  Lumbar DG (Complete) 4+V: Results for orders placed during the hospital encounter of 04/08/19 DG Lumbar Spine Complete  Narrative CLINICAL DATA:  Larey Seat recently. Pain in the lower back and coccyx region.  EXAM: LUMBAR SPINE - COMPLETE 4+ VIEW  COMPARISON:  09/26/2011.  FINDINGS: Since the prior radiographs, the patient has undergone a posterior spinal fusion, L2 through S1, with bilateral pedicle screws and interconnecting rods. The orthopedic hardware appears well seated and well-positioned. There also well-centered radiolucent disc spacers at L2-L3, L3-L4, L4-L5 and L5-S1.  Generalized straightening of the normal lumbar lordosis. No fracture. Minimal anterolisthesis of L4 on L5. No other spondylolisthesis. No bone  lesion.  Skeletal structures are demineralized.  There are scattered aortic atherosclerotic calcifications increased when compared to the prior radiographs.  IMPRESSION: 1. No fracture or acute finding. 2. Status post L2 through S1 posterior lumbar spine fusion. Well-positioned, well-seated orthopedic hardware.   Electronically Signed By: Onalee Hua  Ormond M.D. On: 04/09/2019 08:39  Knee Imaging: Knee-R DG 4 views: Results for orders placed during the hospital encounter of 02/25/18 DG Knee Complete 4 Views Right  Narrative CLINICAL DATA:  77 year old female with bilateral knee pain worse on the right. No known injury. Initial encounter.  EXAM: RIGHT KNEE - COMPLETE 4+ VIEW  COMPARISON:  None.  FINDINGS: Moderate to marked medial tibiofemoral joint degenerative changes and joint space narrowing. Slight widening lateral tibiofemoral joint space. Mild to slightly moderate patellofemoral joint degenerative changes. No fracture or dislocation. No joint effusion.  IMPRESSION: 1. Moderate to marked medial tibiofemoral joint degenerative changes and joint space narrowing. 2. Slight widening lateral tibiofemoral joint space. 3. Mild to slightly moderate patellofemoral joint degenerative changes.   Electronically Signed By: Lacy Duverney M.D. On: 02/25/2018 17:28  Knee-L DG 4 views: Results for orders placed during the hospital encounter of 02/25/18 DG Knee Complete 4 Views Left  Narrative CLINICAL DATA:  77 year old female with bilateral knee pain worse on the right. No known injury. Initial encounter.  EXAM: LEFT KNEE - COMPLETE 4+ VIEW  COMPARISON:  None.  FINDINGS: Moderate to marked medial tibiofemoral joint space narrowing and degenerative changes. Widening lateral tibiofemoral joint space. Mild to slightly moderate patellofemoral joint degenerative changes. No fracture or dislocation. No significant joint effusion.  IMPRESSION: 1. Moderate to marked medial  tibiofemoral joint space narrowing and degenerative changes. 2. Widening lateral tibiofemoral joint space. 3. Mild to slightly moderate patellofemoral joint degenerative changes.   Electronically Signed By: Lacy Duverney M.D. On: 02/25/2018 17:24  Complexity Note: Imaging results reviewed.                         ROS  Cardiovascular: {Hx; Cardiovascular History:210120525} Pulmonary or Respiratory: {Hx; Pumonary and/or Respiratory History:210120523} Neurological: {Hx; Neurological:210120504} Psychological-Psychiatric: {Hx; Psychological-Psychiatric History:210120512} Gastrointestinal: {Hx; Gastrointestinal:210120527} Genitourinary: {Hx; Genitourinary:210120506} Hematological: {Hx; Hematological:210120510} Endocrine: {Hx; Endocrine history:210120509} Rheumatologic: {Hx; Rheumatological:210120530} Musculoskeletal: {Hx; Musculoskeletal:210120528} Work History: {Hx; Work history:210120514}  Allergies  Ms. Umholtz is allergic to hydrocodone, aleve [naproxen sodium], contrast media [iodinated contrast media], ioxaglate, oxycodone, ranexa [ranolazine], sulfa antibiotics, and tramadol.  Laboratory Chemistry Profile   Renal Lab Results  Component Value Date   BUN 20 03/16/2022   CREATININE 1.25 (H) 03/16/2022   BCR 16 03/16/2022   GFRAA 32 (L) 02/10/2020   GFRNONAA 49 (L) 03/10/2022   SPECGRAV 1.025 09/08/2021   PHUR 6.0 09/08/2021   PROTEINUR 100 (A) 02/24/2022     Electrolytes Lab Results  Component Value Date   NA 142 03/16/2022   K 4.7 03/16/2022   CL 102 03/16/2022   CALCIUM 9.2 03/16/2022   MG 2.5 (H) 03/10/2022   PHOS 2.6 03/10/2022     Hepatic Lab Results  Component Value Date   AST 29 03/16/2022   ALT 22 03/16/2022   ALBUMIN 3.8 03/16/2022   ALKPHOS 84 03/16/2022   LIPASE 62 (H) 06/08/2021     ID Lab Results  Component Value Date   SARSCOV2NAA NEGATIVE 03/08/2022     Bone Lab Results  Component Value Date   VD25OH 45 11/23/2021     Endocrine Lab  Results  Component Value Date   GLUCOSE 85 03/16/2022   GLUCOSEU >=500 (A) 02/24/2022   HGBA1C 6.5 (H) 06/20/2022   TSH 0.812 03/16/2022     Neuropathy Lab Results  Component Value Date   VITAMINB12 427 03/26/2017   FOLATE 8.9 03/26/2017   HGBA1C 6.5 (H) 06/20/2022  CNS No results found for: "COLORCSF", "APPEARCSF", "RBCCOUNTCSF", "WBCCSF", "POLYSCSF", "LYMPHSCSF", "EOSCSF", "PROTEINCSF", "GLUCCSF", "JCVIRUS", "CSFOLI", "IGGCSF", "LABACHR", "ACETBL"   Inflammation (CRP: Acute  ESR: Chronic) Lab Results  Component Value Date   CRP 38.3 (H) 01/15/2014   ESRSEDRATE 45 11/23/2021   LATICACIDVEN 0.9 03/08/2022     Rheumatology Lab Results  Component Value Date   LABURIC 8.0 (H) 01/15/2014     Coagulation Lab Results  Component Value Date   INR 1.1 03/08/2022   LABPROT 13.9 03/08/2022   APTT 30 04/17/2020   PLT 412 03/16/2022   DDIMER 2.08 (H) 03/09/2022     Cardiovascular Lab Results  Component Value Date   BNP 58.6 03/07/2022   TROPONINI <0.03 03/11/2018   HGB 13.7 03/16/2022   HCT 42.9 03/16/2022     Screening Lab Results  Component Value Date   SARSCOV2NAA NEGATIVE 03/08/2022   COVIDSOURCE NASOPHARYNGEAL 08/22/2018     Cancer No results found for: "CEA", "CA125", "LABCA2"   Allergens No results found for: "ALMOND", "APPLE", "ASPARAGUS", "AVOCADO", "BANANA", "BARLEY", "BASIL", "BAYLEAF", "GREENBEAN", "LIMABEAN", "WHITEBEAN", "BEEFIGE", "REDBEET", "BLUEBERRY", "BROCCOLI", "CABBAGE", "MELON", "CARROT", "CASEIN", "CASHEWNUT", "CAULIFLOWER", "CELERY"     Note: Lab results reviewed.  PFSH  Drug: Ms. Shor  reports no history of drug use. Alcohol:  reports no history of alcohol use. Tobacco:  reports that she quit smoking about 15 years ago. Her smoking use included cigarettes. She has a 7.50 pack-year smoking history. She has never used smokeless tobacco. Medical:  has a past medical history of Asthma, Benign neoplasm of colon, Breast cancer (08/2018),  Cervical disc disease, Chronic diastolic CHF (congestive heart failure), CKD (chronic kidney disease), stage III, COPD (chronic obstructive pulmonary disease), Coronary artery disease, Diabetes mellitus without complication, Diagnosis unknown (05/07/2022), Fusion of lumbar spine (12/22/2015), GERD (gastroesophageal reflux disease), History of DVT (deep vein thrombosis), History of tobacco abuse, Hyperlipidemia, Hypertension, Hypothyroidism, Lung cancer, Malignant neoplasm of unspecified site of right female breast (03/14/1999), Obesity, Palpitations, Personal history of radiation therapy, PONV (postoperative nausea and vomiting), Pulmonary embolus (12/25/2013), PVD (peripheral vascular disease), and Stroke (2015). Family: family history includes COPD in her sister and sister; Cancer in her mother and sister; Colon cancer (age of onset: 33) in her brother; Diabetes in her brother, brother, brother, and father; Heart attack in her brother, brother, brother, and father; Heart disease in her brother, brother, brother, and father; Hypertension in her brother, brother, brother, and father; Lung cancer in her brother.  Past Surgical History:  Procedure Laterality Date   ANGIOPLASTY / STENTING FEMORAL     PVD; angioplasty right leg and left femoral artery with stenosis.    APPENDECTOMY  12/2014   BACK SURGERY  03/2012   nerve stimulator inserted   BACK SURGERY     screws, rods replaced with new hardware.   BACK SURGERY  11/2016   BREAST LUMPECTOMY Left 08/26/2018   BREAST SURGERY     CARDIAC CATHETERIZATION  12/07/2011   Mid LAD 50%, distal LAD 50%, mid RCA 70%, distal RCA 100%    CARDIAC CATHETERIZATION  01/04/2011   100% occluded mid RCA with good collaterals from distal LAD, normal LVEF.   CATARACT EXTRACTION Right 12/21/2021   CHOLECYSTECTOMY     COLONOSCOPY  2013   COLONOSCOPY WITH PROPOFOL N/A 05/24/2015   Procedure: COLONOSCOPY WITH PROPOFOL;  Surgeon: Midge Minium, MD;  Location: [facility];  Service: Endoscopy;  Laterality: N/A;  Diabetic - oral meds   COLONOSCOPY WITH PROPOFOL N/A 01/28/2019   Procedure:  COLONOSCOPY WITH PROPOFOL;  Surgeon: Pasty Spillers, MD;  Location: [facility];  Service: Endoscopy;  Laterality: N/A;   DIAGNOSTIC LAPAROSCOPY     ESOPHAGOGASTRODUODENOSCOPY (EGD) WITH PROPOFOL N/A 01/28/2019   Procedure: ESOPHAGOGASTRODUODENOSCOPY (EGD) WITH PROPOFOL;  Surgeon: Pasty Spillers, MD;  Location: [facility];  Service: Endoscopy;  Laterality: N/A;   LAPAROSCOPIC APPENDECTOMY N/A 01/06/2015   Procedure: APPENDECTOMY LAPAROSCOPIC;  Surgeon: Kieth Brightly, MD;  Location: [facility];  Service: General;  Laterality: N/A;   LEFT HEART CATH AND CORONARY ANGIOGRAPHY Left 04/04/2019   Procedure: LEFT HEART CATH AND CORONARY ANGIOGRAPHY;  Surgeon: Antonieta Iba, MD;  Location: [facility];  Service: Cardiovascular;  Laterality: Left;   MASTECTOMY W/ SENTINEL NODE BIOPSY Left 08/26/2018   Procedure: PARTIAL MASTECTOMY WIDE EXCISION WITH SENTINEL LYMPH NODE BIOPSY LEFT;  Surgeon: Earline Mayotte, MD;  Location: [facility];  Service: General;  Laterality: Left;   POLYPECTOMY  05/24/2015   Procedure: POLYPECTOMY;  Surgeon: Midge Minium, MD;  Location: [facility];  Service: Endoscopy;;   REPLACEMENT TOTAL KNEE Right    RIGHT/LEFT HEART CATH AND CORONARY ANGIOGRAPHY Bilateral 09/26/2021   Procedure: RIGHT/LEFT HEART CATH AND CORONARY ANGIOGRAPHY;  Surgeon: Iran Ouch, MD;  Location: [facility];  Service: Cardiovascular;  Laterality: Bilateral;   THYROIDECTOMY     TOTAL KNEE ARTHROPLASTY Right 11/13/2019   TRIGGER FINGER RELEASE     Active Ambulatory Problems    Diagnosis Date Noted   Coronary atherosclerosis of native coronary artery 06/19/2013   Exertional dyspnea 06/19/2013   Chronic back pain 06/19/2013   History of back surgery 06/19/2013   Hyperlipidemia 06/19/2013   PAD (peripheral artery disease)  06/19/2013   Benign hypertensive renal disease 12/16/2013   Uncontrolled type 2 diabetes mellitus with hyperglycemia, with long-term current use of insulin 12/16/2013   Pulmonary embolism 01/30/2014   Coronary artery disease    Essential hypertension    Morbid obesity due to excess calories    Rhinitis, allergic 10/20/2014   Congenital obstruction of ureteropelvic junction 11/21/2011   Intermittent left-sided chest pain 11/18/2014   Abdominal aortic atherosclerosis 12/18/2014   Diverticulosis of colon 12/18/2014   Constipation 04/07/2015   Bilateral atelectasis 04/11/2015   Fatty liver disease, nonalcoholic 04/11/2015   Hydronephrosis of right kidney 04/11/2015   Abdominal aortic ectasia 04/11/2015   Overactive bladder 11/03/2013   Spinal stenosis of lumbar region 06/06/2014   Neuralgia neuritis, sciatic nerve 05/24/2014   Herniated nucleus pulposus 06/06/2014   Benign neoplasm of sigmoid colon    Benign neoplasm of cecum    Benign neoplasm of ascending colon    Encephalomalacia on imaging study 12/10/2015   Calcification of both carotid arteries 12/10/2015   Hypothyroid 07/18/2016   COPD (chronic obstructive pulmonary disease) 07/18/2016   Chronic right-sided low back pain with right-sided sciatica 05/28/2017   Gait abnormality 05/28/2017   Right knee pain 08/01/2017   Carcinoma of lower-inner quadrant of left breast in female, estrogen receptor positive 10/02/2018   Dysphagia    Thickening of esophagus    Stomach irritation    Gastric polyp    Positive occult stool blood test    Polyp of colon    Unstable angina 04/03/2019   Hyperkalemia 03/14/1999   Proteinuria 11/27/2018   Diabetes mellitus, type II 11/27/2018   Anemia 02/16/2020   Asthma 02/16/2020   History of cardiac catheterization 02/16/2020   Left breast cancer s/p mastectomy 02/16/2020   History of total knee arthroplasty 11/18/2019  Malignant tumor of breast 02/16/2020   Fall 04/17/2020   Lung nodule  08/19/2020   Atherosclerosis of native arteries of extremity with intermittent claudication 02/15/2021   Cervical radiculopathy 10/14/2020   Sacroiliac inflammation 10/14/2020   Stage 3b chronic kidney disease 06/08/2021   Acute respiratory failure with hypoxia 06/09/2021   Chronic anticoagulation 06/09/2021   Elevated lipase 06/09/2021   Osteoarthritis of spine with radiculopathy, cervical region 10/20/2021   Osteoarthritis of spine with radiculopathy, thoracic region 10/20/2021   Primary cancer of left upper lobe of lung 01/10/2022   Acute metabolic encephalopathy 03/08/2022   Personal history of pulmonary embolism 03/14/1999   Type 2 diabetes mellitus with diabetic peripheral angiopathy without gangrene 03/14/1999   Type 2 diabetes mellitus with diabetic chronic kidney disease 03/14/1999   Atherosclerosis of aorta 03/14/1999   Obesity, unspecified 03/14/1999   Long term (current) use of inhaled steroids 03/14/1999   Coronary artery disease involving native coronary artery of native heart without angina pectoris 03/14/1999   Other disorders of phosphorus metabolism 03/14/1999   Other chronic pain 03/14/1999   Metabolic encephalopathy 03/14/1999   Long term (current) use of anticoagulants 03/14/1999   Hypomagnesemia 03/14/1999   Hypertensive chronic kidney disease w stg 1-4/unsp chr kdny 03/14/1999   Body mass index (BMI) 33.0-33.9, adult 03/13/2022   Chronic kidney disease, stage 3b 03/14/1999   Dvrtclos of intest, part unsp, w/o perf or abscess w/o bleed 03/14/1999   Emphysema, unspecified 03/14/1999   Fatty (change of) liver, not elsewhere classified 03/14/1999   Resolved Ambulatory Problems    Diagnosis Date Noted   Morbid obesity 06/19/2013   Sinus tachycardia 12/16/2013   Chronic kidney disease 12/16/2013   Acute idiopathic gout of left foot 01/23/2014   Urinary tract infection 11/03/2013   Right lower quadrant abdominal pain    Morbid obesity 11/18/2014   Bilateral  lower abdominal pain 12/16/2014   Vaginal discharge 04/07/2015   Hydronephrosis 11/17/2011   Noninfectious diarrhea    Medication monitoring encounter 05/26/2015   Renal failure (ARF), acute on chronic 11/22/2015   Fatigue 08/01/2017   Dysuria 05/14/2018   Maxillary sinusitis, acute 05/14/2018   Malignant neoplasm of lower-inner quadrant of left female breast 08/14/2018   Sepsis 04/17/2020   Acute lower UTI 04/17/2020   Influenza A with pneumonia 03/08/2022   Malignant neoplasm of unspecified site of right female breast 03/14/1999   Personal history of pneumonia (recurrent) 02/24/2022   Diagnosis unknown 05/07/2022   Past Medical History:  Diagnosis Date   Benign neoplasm of colon    Breast cancer 08/2018   Cervical disc disease    Chronic diastolic CHF (congestive heart failure)    CKD (chronic kidney disease), stage III    Diabetes mellitus without complication    Fusion of lumbar spine 12/22/2015   GERD (gastroesophageal reflux disease)    History of DVT (deep vein thrombosis)    History of tobacco abuse    Hypertension    Hypothyroidism    Lung cancer    Obesity    Palpitations    Personal history of radiation therapy    PONV (postoperative nausea and vomiting)    Pulmonary embolus 12/25/2013   PVD (peripheral vascular disease)    Stroke 2015   Constitutional Exam  General appearance: Well nourished, well developed, and well hydrated. In no apparent acute distress There were no vitals filed for this visit. BMI Assessment: Estimated body mass index is 31.7 kg/m as calculated from the following:   Height as of  05/17/22:  (1.676 m).   Weight as of 06/20/22: 196 lb 6.4 oz (89.1 kg).  BMI interpretation table: BMI level Category Range association with higher incidence of chronic pain  <18 kg/m2 Underweight   18.5-24.9 kg/m2 Ideal body weight   25-29.9 kg/m2 Overweight Increased incidence by 20%  30-34.9 kg/m2 Obese (Class I) Increased incidence by 68%  35-39.9  kg/m2 Severe obesity (Class II) Increased incidence by 136%  >40 kg/m2 Extreme obesity (Class III) Increased incidence by 254%   Patient's current BMI Ideal Body weight  There is no height or weight on file to calculate BMI. Ideal body weight: 59.3 kg (130 lb 11.7 oz) Adjusted ideal body weight: 71.2 kg (157 lb)   BMI Readings from Last 4 Encounters:  06/20/22 31.70 kg/m  05/17/22 32.12 kg/m  05/02/22 32.28 kg/m  04/24/22 33.45 kg/m   Wt Readings from Last 4 Encounters:  06/20/22 196 lb 6.4 oz (89.1 kg)  05/17/22 199 lb (90.3 kg)  05/02/22 200 lb (90.7 kg)  03/07/22 207 lb 3.7 oz (94 kg)    Psych/Mental status: Alert, oriented x 3 (person, place, & time)       Eyes: PERLA Respiratory: No evidence of acute respiratory distress  Assessment  Primary Diagnosis & Pertinent Problem List: There were no encounter diagnoses.  Visit Diagnosis (New problems to examiner): No diagnosis found. Plan of Care (Initial workup plan)  Note: Ms. Lartigue was reminded that as per protocol, today's visit has been an evaluation only. We have not taken over the patient's controlled substance management.  Problem-specific plan: No problem-specific Assessment & Plan notes found for this encounter.  Lab Orders  No laboratory test(s) ordered today   Imaging Orders  No imaging studies ordered today   Referral Orders  No referral(s) requested today   Procedure Orders    No procedure(s) ordered today   Pharmacotherapy (current): Medications ordered:  No orders of the defined types were placed in this encounter.  Medications administered during this visit: December A. Azeez had no medications administered during this visit.   Analgesic Pharmacotherapy:  Opioid Analgesics: For patients currently taking or requesting to take opioid analgesics, in accordance with [facility] Guidelines, we will assess their risks and indications for the use of these substances. After completing our  evaluation, we may offer recommendations, but we no longer take patients for medication management. The prescribing physician will ultimately decide, based on his/her training and level of comfort whether to adopt any of the recommendations, including whether or not to prescribe such medicines.  Membrane stabilizer: To be determined at a later time  Muscle relaxant: To be determined at a later time  NSAID: To be determined at a later time  Other analgesic(s): To be determined at a later time   Interventional management options: Ms. Kohn was informed that there is no guarantee that she would be a candidate for interventional therapies. The decision will be based on the results of diagnostic studies, as well as Ms. Ayo's risk profile.  Procedure(s) under consideration:  Pending results of ordered studies      Interventional Therapies  Risk Factors  Considerations:     Planned  Pending:      Under consideration:   Pending   Completed:   None at this time   Therapeutic  Palliative (PRN) options:   None established   Completed by other providers:   None reported       Provider-requested follow-up: No follow-ups on file.  Future Appointments  Date Time Provider Department Center  06/28/2022 10:00 AM Delano Metz, MD [facility] None  07/05/2022  1:00 PM [facility]  07/11/2022  2:45 PM [facility] None  07/11/2022  3:00 PM Earna Coder, MD [facility] None  07/19/2022 12:50 PM Sherrie George, MD [facility] None  07/25/2022 11:20 AM Dorcas Carrow, DO [facility]  08/01/2022  2:40 PM [facility]  08/08/2022 10:30 AM [facility] CASE MANAGER [facility]  08/09/2022  2:00 PM Chrystal, Sherrine Maples, MD [facility] None    Duration of encounter: *** minutes.  Total time on encounter, as per AMA guidelines included both the face-to-face and non-face-to-face time personally spent by the physician and/or other qualified health care professional(s)  on the day of the encounter (includes time in activities that require the physician or other qualified health care professional and does not include time in activities normally performed by clinical staff). Physician's time may include the following activities when performed: Preparing to see the patient (e.g., pre-charting review of records, searching for previously ordered imaging, lab work, and nerve conduction tests) Review of prior analgesic pharmacotherapies. Reviewing PMP Interpreting ordered tests (e.g., lab work, imaging, nerve conduction tests) Performing post-procedure evaluations, including interpretation of diagnostic procedures Obtaining and/or reviewing separately obtained history Performing a medically appropriate examination and/or evaluation Counseling and educating the patient/family/caregiver Ordering medications, tests, or procedures Referring and communicating with other health care professionals (when not separately reported) Documenting clinical information in the electronic or other health record Independently interpreting results (not separately reported) and communicating results to the patient/ family/caregiver Care coordination (not separately reported)  Note by: Oswaldo Done, MD (TTS technology used. I apologize for any typographical errors that were not detected and corrected.) Date: 06/28/2022; Time: 1:15 PM

## 2022-06-28 ENCOUNTER — Ambulatory Visit: Payer: Medicare Other | Attending: Pain Medicine | Admitting: Pain Medicine

## 2022-06-28 ENCOUNTER — Telehealth: Payer: Self-pay

## 2022-06-28 ENCOUNTER — Other Ambulatory Visit: Payer: Self-pay

## 2022-06-28 ENCOUNTER — Encounter: Payer: Self-pay | Admitting: Pain Medicine

## 2022-06-28 VITALS — BP 103/60 | HR 79 | Temp 97.3°F | Resp 18 | Ht 66.0 in | Wt 196.0 lb

## 2022-06-28 DIAGNOSIS — M47812 Spondylosis without myelopathy or radiculopathy, cervical region: Secondary | ICD-10-CM

## 2022-06-28 DIAGNOSIS — R252 Cramp and spasm: Secondary | ICD-10-CM

## 2022-06-28 DIAGNOSIS — M4316 Spondylolisthesis, lumbar region: Secondary | ICD-10-CM

## 2022-06-28 DIAGNOSIS — M4802 Spinal stenosis, cervical region: Secondary | ICD-10-CM | POA: Diagnosis not present

## 2022-06-28 DIAGNOSIS — M25561 Pain in right knee: Secondary | ICD-10-CM | POA: Diagnosis not present

## 2022-06-28 DIAGNOSIS — Z789 Other specified health status: Secondary | ICD-10-CM | POA: Diagnosis not present

## 2022-06-28 DIAGNOSIS — M4310 Spondylolisthesis, site unspecified: Secondary | ICD-10-CM

## 2022-06-28 DIAGNOSIS — M542 Cervicalgia: Secondary | ICD-10-CM | POA: Insufficient documentation

## 2022-06-28 DIAGNOSIS — Z96651 Presence of right artificial knee joint: Secondary | ICD-10-CM | POA: Diagnosis present

## 2022-06-28 DIAGNOSIS — M545 Low back pain, unspecified: Secondary | ICD-10-CM

## 2022-06-28 DIAGNOSIS — Z79899 Other long term (current) drug therapy: Secondary | ICD-10-CM

## 2022-06-28 DIAGNOSIS — T85192S Other mechanical complication of implanted electronic neurostimulator (electrode) of spinal cord, sequela: Secondary | ICD-10-CM

## 2022-06-28 DIAGNOSIS — M79604 Pain in right leg: Secondary | ICD-10-CM

## 2022-06-28 DIAGNOSIS — M25562 Pain in left knee: Secondary | ICD-10-CM

## 2022-06-28 DIAGNOSIS — M1711 Unilateral primary osteoarthritis, right knee: Secondary | ICD-10-CM

## 2022-06-28 DIAGNOSIS — M961 Postlaminectomy syndrome, not elsewhere classified: Secondary | ICD-10-CM

## 2022-06-28 DIAGNOSIS — M5136 Other intervertebral disc degeneration, lumbar region: Secondary | ICD-10-CM

## 2022-06-28 DIAGNOSIS — Z7901 Long term (current) use of anticoagulants: Secondary | ICD-10-CM

## 2022-06-28 DIAGNOSIS — M1712 Unilateral primary osteoarthritis, left knee: Secondary | ICD-10-CM

## 2022-06-28 DIAGNOSIS — G8929 Other chronic pain: Secondary | ICD-10-CM | POA: Diagnosis not present

## 2022-06-28 DIAGNOSIS — M19012 Primary osteoarthritis, left shoulder: Secondary | ICD-10-CM

## 2022-06-28 DIAGNOSIS — M5030 Other cervical disc degeneration, unspecified cervical region: Secondary | ICD-10-CM | POA: Diagnosis not present

## 2022-06-28 DIAGNOSIS — M4312 Spondylolisthesis, cervical region: Secondary | ICD-10-CM | POA: Diagnosis not present

## 2022-06-28 DIAGNOSIS — M533 Sacrococcygeal disorders, not elsewhere classified: Secondary | ICD-10-CM | POA: Diagnosis not present

## 2022-06-28 DIAGNOSIS — Z981 Arthrodesis status: Secondary | ICD-10-CM

## 2022-06-28 DIAGNOSIS — Z9682 Presence of neurostimulator: Secondary | ICD-10-CM | POA: Diagnosis not present

## 2022-06-28 DIAGNOSIS — R937 Abnormal findings on diagnostic imaging of other parts of musculoskeletal system: Secondary | ICD-10-CM | POA: Diagnosis not present

## 2022-06-28 DIAGNOSIS — M170 Bilateral primary osteoarthritis of knee: Secondary | ICD-10-CM | POA: Diagnosis not present

## 2022-06-28 DIAGNOSIS — M899 Disorder of bone, unspecified: Secondary | ICD-10-CM

## 2022-06-28 DIAGNOSIS — G894 Chronic pain syndrome: Secondary | ICD-10-CM

## 2022-06-28 DIAGNOSIS — M51369 Other intervertebral disc degeneration, lumbar region without mention of lumbar back pain or lower extremity pain: Secondary | ICD-10-CM

## 2022-06-28 NOTE — Telephone Encounter (Signed)
Dr. Shauna Hugh would like permission for patient to stop Eliquis for 3 days for potential procedure.   Thank you!

## 2022-06-28 NOTE — Progress Notes (Signed)
Safety precautions to be maintained throughout the outpatient stay will include: orient to surroundings, keep bed in low position, maintain call bell within reach at all times, provide assistance with transfer out of bed and ambulation.  

## 2022-06-28 NOTE — Patient Instructions (Signed)
____________________________________________________________________________________________  New Patients  Welcome to [facility].   Initial Visit The first or initial visit consists of an evaluation only.   Interventional pain management.  We offer therapies other than opioid controlled substances to manage chronic pain. These include, but are not limited to, diagnostic, therapeutic, and palliative specialized injection therapies (i.e.: Epidural Steroids, Facet Blocks, etc.). We specialize in a variety of nerve blocks as well as radiofrequency treatments. We offer pain implant evaluations and trials, as well as follow up management. In addition we also provide a variety joint injections, including Viscosupplementation (AKA: Gel Therapy).  Prescription Pain Medication. We specialize in alternatives to opioids. We can provide evaluations and recommendations for/of pharmacologic therapies based on CDC Guidelines.  We no longer take patients for long-term medication management. We will not be taking over your pain medications.  ____________________________________________________________________________________________    ____________________________________________________________________________________________  Patient Information update  To: All of our patients.  Re: Name change.  It has been made official that our current name, "[facility]"   will soon be changed to "[facility]".   The purpose of this change is to eliminate any confusion created by the concept of our practice being a "Medication Management Pain Clinic". In the past this has led to the misconception that we treat pain primarily by the use of prescription medications.  Nothing can be farther from the truth.   Understanding PAIN MANAGEMENT: To  further understand what our practice does, you first have to understand that "Pain Management" is a subspecialty that requires additional training once a physician has completed their specialty training, which can be in either Anesthesia, Neurology, Psychiatry, or Physical Medicine and Rehabilitation (PMR). Each one of these contributes to the final approach taken by each physician to the management of their patient's pain. To be a "Pain Management Specialist" you must have first completed one of the specialty trainings below.  Anesthesiologists - trained in clinical pharmacology and interventional techniques such as nerve blockade and regional as well as central neuroanatomy. They are trained to block pain before, during, and after surgical interventions.  Neurologists - trained in the diagnosis and pharmacological treatment of complex neurological conditions, such as Multiple Sclerosis, Parkinson's, spinal cord injuries, and other systemic conditions that may be associated with symptoms that may include but are not limited to pain. They tend to rely primarily on the treatment of chronic pain using prescription medications.  Psychiatrist - trained in conditions affecting the psychosocial wellbeing of patients including but not limited to depression, anxiety, schizophrenia, personality disorders, addiction, and other substance use disorders that may be associated with chronic pain. They tend to rely primarily on the treatment of chronic pain using prescription medications.   Physical Medicine and Rehabilitation (PMR) physicians, also known as physiatrists - trained to treat a wide variety of medical conditions affecting the brain, spinal cord, nerves, bones, joints, ligaments, muscles, and tendons. Their training is primarily aimed at treating patients that have suffered injuries that have caused severe physical impairment. Their training is primarily aimed at the physical therapy and rehabilitation of those  patients. They may also work alongside orthopedic surgeons or neurosurgeons using their expertise in assisting surgical patients to recover after their surgeries.  INTERVENTIONAL PAIN MANAGEMENT is sub-subspecialty of Pain Management.  Our physicians are Board-certified in Anesthesia, Pain Management, and Interventional Pain Management.  This meaning that not only have they been trained   and Board-certified in their specialty of Anesthesia, and subspecialty of Pain Management, but they have also received further training in the sub-subspecialty of Interventional Pain Management, in order to become Board-certified as INTERVENTIONAL PAIN MANAGEMENT SPECIALIST.    Mission: Our goal is to use our skills in  INTERVENTIONAL PAIN MANAGEMENT as alternatives to the chronic use of prescription opioid medications for the treatment of pain. To make this more clear, we have changed our name to reflect what we do and offer. We will continue to offer medication management assessment and recommendations, but we will not be taking over any patient's medication management.  ____________________________________________________________________________________________     ____________________________________________________________________________________________  General Risks and Possible Complications  Patient Responsibilities: It is important that you read this as it is part of your informed consent. It is our duty to inform you of the risks and possible complications associated with treatments offered to you. It is your responsibility as a patient to read this and to ask questions about anything that is not clear or that you believe was not covered in this document.  Patient's Rights: You have the right to refuse treatment. You also have the right to change your mind, even after initially having agreed to have the treatment done. However, under this last option, if you wait until the last second to change your mind,  you may be charged for the materials used up to that point.  Introduction: Medicine is not an exact science. Everything in Medicine, including the lack of treatment(s), carries the potential for danger, harm, or loss (which is by definition: Risk). In Medicine, a complication is a secondary problem, condition, or disease that can aggravate an already existing one. All treatments carry the risk of possible complications. The fact that a side effects or complications occurs, does not imply that the treatment was conducted incorrectly. It must be clearly understood that these can happen even when everything is done following the highest safety standards.  No treatment: You can choose not to proceed with the proposed treatment alternative. The "PRO(s)" would include: avoiding the risk of complications associated with the therapy. The "CON(s)" would include: not getting any of the treatment benefits. These benefits fall under one of three categories: diagnostic; therapeutic; and/or palliative. Diagnostic benefits include: getting information which can ultimately lead to improvement of the disease or symptom(s). Therapeutic benefits are those associated with the successful treatment of the disease. Finally, palliative benefits are those related to the decrease of the primary symptoms, without necessarily curing the condition (example: decreasing the pain from a flare-up of a chronic condition, such as incurable terminal cancer).  General Risks and Complications: These are associated to most interventional treatments. They can occur alone, or in combination. They fall under one of the following six (6) categories: no benefit or worsening of symptoms; bleeding; infection; nerve damage; allergic reactions; and/or death. No benefits or worsening of symptoms: In Medicine there are no guarantees, only probabilities. No healthcare provider can ever guarantee that a medical treatment will work, they can only state the  probability that it may. Furthermore, there is always the possibility that the condition may worsen, either directly, or indirectly, as a consequence of the treatment. Bleeding: This is more common if the patient is taking a blood thinner, either prescription or over the counter (example: Goody Powders, Fish oil, Aspirin, Garlic, etc.), or if suffering a condition associated with impaired coagulation (example: Hemophilia, cirrhosis of the liver, low platelet counts, etc.). However, even if you do not have   one on these, it can still happen. If you have any of these conditions, or take one of these drugs, make sure to notify your treating physician. Infection: This is more common in patients with a compromised immune system, either due to disease (example: diabetes, cancer, human immunodeficiency virus [HIV], etc.), or due to medications or treatments (example: therapies used to treat cancer and rheumatological diseases). However, even if you do not have one on these, it can still happen. If you have any of these conditions, or take one of these drugs, make sure to notify your treating physician. Nerve Damage: This is more common when the treatment is an invasive one, but it can also happen with the use of medications, such as those used in the treatment of cancer. The damage can occur to small secondary nerves, or to large primary ones, such as those in the spinal cord and brain. This damage may be temporary or permanent and it may lead to impairments that can range from temporary numbness to permanent paralysis and/or brain death. Allergic Reactions: Any time a substance or material comes in contact with our body, there is the possibility of an allergic reaction. These can range from a mild skin rash (contact dermatitis) to a severe systemic reaction (anaphylactic reaction), which can result in death. Death: In general, any medical intervention can result in death, most of the time due to an unforeseen  complication. ____________________________________________________________________________________________    

## 2022-07-04 ENCOUNTER — Ambulatory Visit
Admission: RE | Admit: 2022-07-04 | Discharge: 2022-07-04 | Disposition: A | Discharge status: Home / Self Care | Payer: Medicare Other | Attending: Pain Medicine | Admitting: Pain Medicine

## 2022-07-04 ENCOUNTER — Ambulatory Visit
Admission: RE | Admit: 2022-07-04 | Discharge: 2022-07-04 | Disposition: A | Discharge status: Home / Self Care | Payer: Medicare Other | Source: Ambulatory Visit | Attending: Pain Medicine | Admitting: Pain Medicine

## 2022-07-04 ENCOUNTER — Other Ambulatory Visit: Payer: Self-pay | Admitting: Internal Medicine

## 2022-07-04 DIAGNOSIS — M961 Postlaminectomy syndrome, not elsewhere classified: Secondary | ICD-10-CM | POA: Diagnosis not present

## 2022-07-04 DIAGNOSIS — G8929 Other chronic pain: Secondary | ICD-10-CM | POA: Diagnosis not present

## 2022-07-04 DIAGNOSIS — M5030 Other cervical disc degeneration, unspecified cervical region: Secondary | ICD-10-CM | POA: Diagnosis not present

## 2022-07-04 DIAGNOSIS — M545 Low back pain, unspecified: Secondary | ICD-10-CM | POA: Insufficient documentation

## 2022-07-04 DIAGNOSIS — M47812 Spondylosis without myelopathy or radiculopathy, cervical region: Secondary | ICD-10-CM | POA: Insufficient documentation

## 2022-07-04 DIAGNOSIS — M25562 Pain in left knee: Secondary | ICD-10-CM | POA: Insufficient documentation

## 2022-07-04 DIAGNOSIS — R937 Abnormal findings on diagnostic imaging of other parts of musculoskeletal system: Secondary | ICD-10-CM | POA: Insufficient documentation

## 2022-07-04 DIAGNOSIS — Z981 Arthrodesis status: Secondary | ICD-10-CM | POA: Insufficient documentation

## 2022-07-04 DIAGNOSIS — M5136 Other intervertebral disc degeneration, lumbar region: Secondary | ICD-10-CM

## 2022-07-04 DIAGNOSIS — M4316 Spondylolisthesis, lumbar region: Secondary | ICD-10-CM

## 2022-07-04 DIAGNOSIS — M4802 Spinal stenosis, cervical region: Secondary | ICD-10-CM | POA: Diagnosis not present

## 2022-07-04 DIAGNOSIS — M533 Sacrococcygeal disorders, not elsewhere classified: Secondary | ICD-10-CM

## 2022-07-04 DIAGNOSIS — M4310 Spondylolisthesis, site unspecified: Secondary | ICD-10-CM | POA: Diagnosis not present

## 2022-07-04 DIAGNOSIS — M4312 Spondylolisthesis, cervical region: Secondary | ICD-10-CM | POA: Diagnosis not present

## 2022-07-04 DIAGNOSIS — M25561 Pain in right knee: Secondary | ICD-10-CM | POA: Diagnosis not present

## 2022-07-04 DIAGNOSIS — M542 Cervicalgia: Secondary | ICD-10-CM | POA: Insufficient documentation

## 2022-07-04 LAB — COMPLIANCE DRUG ANALYSIS, UR

## 2022-07-05 ENCOUNTER — Ambulatory Visit: Payer: Medicare Other

## 2022-07-06 ENCOUNTER — Other Ambulatory Visit: Payer: Self-pay | Admitting: Family Medicine

## 2022-07-06 DIAGNOSIS — N289 Disorder of kidney and ureter, unspecified: Secondary | ICD-10-CM

## 2022-07-06 DIAGNOSIS — E875 Hyperkalemia: Secondary | ICD-10-CM

## 2022-07-06 LAB — COMP. METABOLIC PANEL (12)
AST: 18 IU/L (ref 0–40)
Albumin/Globulin Ratio: 1.5 (ref 1.2–2.2)
Albumin: 4 g/dL (ref 3.8–4.8)
Alkaline Phosphatase: 91 IU/L (ref 44–121)
BUN/Creatinine Ratio: 14 (ref 12–28)
BUN: 25 mg/dL (ref 8–27)
Bilirubin Total: 0.3 mg/dL (ref 0.0–1.2)
Calcium: 9.7 mg/dL (ref 8.7–10.3)
Chloride: 107 mmol/L — ABNORMAL HIGH (ref 96–106)
Creatinine, Ser: 1.83 mg/dL — ABNORMAL HIGH (ref 0.57–1.00)
Globulin, Total: 2.6 g/dL (ref 1.5–4.5)
Glucose: 126 mg/dL — ABNORMAL HIGH (ref 70–99)
Potassium: 5.5 mmol/L — ABNORMAL HIGH (ref 3.5–5.2)
Sodium: 141 mmol/L (ref 134–144)
Total Protein: 6.6 g/dL (ref 6.0–8.5)
eGFR: 28 mL/min/{1.73_m2} — ABNORMAL LOW (ref >59)

## 2022-07-06 LAB — SEDIMENTATION RATE: Sed Rate: 26 mm/hr (ref 0–40)

## 2022-07-06 LAB — 25-HYDROXY VITAMIN D LCMS D2+D3
25-Hydroxy, Vitamin D-2: <1 ng/mL
25-Hydroxy, Vitamin D-3: 41 ng/mL
25-Hydroxy, Vitamin D: 42 ng/mL

## 2022-07-06 LAB — VITAMIN B12: Vitamin B-12: 1036 pg/mL (ref 232–1245)

## 2022-07-06 LAB — MAGNESIUM: Magnesium: 2.2 mg/dL (ref 1.6–2.3)

## 2022-07-06 LAB — C-REACTIVE PROTEIN: CRP: 5 mg/L (ref 0–10)

## 2022-07-10 ENCOUNTER — Other Ambulatory Visit: Payer: Medicare Other

## 2022-07-10 DIAGNOSIS — E875 Hyperkalemia: Secondary | ICD-10-CM

## 2022-07-10 DIAGNOSIS — N289 Disorder of kidney and ureter, unspecified: Secondary | ICD-10-CM | POA: Diagnosis not present

## 2022-07-11 ENCOUNTER — Inpatient Hospital Stay: Payer: Medicare Other

## 2022-07-11 ENCOUNTER — Inpatient Hospital Stay: Payer: Medicare Other | Admitting: Internal Medicine

## 2022-07-11 DIAGNOSIS — I2510 Atherosclerotic heart disease of native coronary artery without angina pectoris: Secondary | ICD-10-CM | POA: Diagnosis not present

## 2022-07-11 DIAGNOSIS — Z794 Long term (current) use of insulin: Secondary | ICD-10-CM

## 2022-07-11 DIAGNOSIS — E1159 Type 2 diabetes mellitus with other circulatory complications: Secondary | ICD-10-CM | POA: Diagnosis not present

## 2022-07-11 LAB — BASIC METABOLIC PANEL
BUN/Creatinine Ratio: 15 (ref 12–28)
BUN: 25 mg/dL (ref 8–27)
CO2: 20 mmol/L (ref 20–29)
Calcium: 9.3 mg/dL (ref 8.7–10.3)
Chloride: 104 mmol/L (ref 96–106)
Creatinine, Ser: 1.62 mg/dL — ABNORMAL HIGH (ref 0.57–1.00)
Glucose: 123 mg/dL — ABNORMAL HIGH (ref 70–99)
Potassium: 4.9 mmol/L (ref 3.5–5.2)
Sodium: 141 mmol/L (ref 134–144)
eGFR: 32 mL/min/{1.73_m2} — ABNORMAL LOW (ref >59)

## 2022-07-11 NOTE — Progress Notes (Unsigned)
PROVIDER NOTE: Information contained herein reflects review and annotations entered in association with encounter. Interpretation of such information and data should be left to medically-trained personnel. Information provided to patient can be located elsewhere in the medical record under "Patient Instructions". Document created using STT-dictation technology, any transcriptional errors that may result from process are unintentional.    Patient: Tammy [name]  Service Category: E/M  Provider: Oswaldo Done, MD  DOB: 10-08-1945  DOS: 07/12/2022  Referring Provider: Dorcas Carrow, DO  MRN: [id number]  Specialty: Interventional Pain Management  PCP: Dorcas Carrow, DO  Type: Established Patient  Setting: Ambulatory outpatient    Location: Office  Delivery: Face-to-face     Primary Reason(s) for Visit: Encounter for evaluation before starting new chronic pain management plan of care (Level of risk: moderate) CC: No chief complaint on file.  HPI  Tammy [name] is a 77 y.o. year old, female patient, who comes today for a follow-up evaluation to review the test results and decide on a treatment plan. She has Coronary atherosclerosis of native coronary artery; Exertional dyspnea; History of back surgery; Hyperlipidemia; PAD (peripheral artery disease) (HCC); Benign hypertensive renal disease; Uncontrolled type 2 diabetes mellitus with hyperglycemia, with long-term current use of insulin (HCC); Pulmonary embolism (HCC); Coronary artery disease; Essential hypertension; Morbid obesity due to excess calories (HCC); Rhinitis, allergic; Congenital obstruction of ureteropelvic junction; Intermittent left-sided chest pain; Abdominal aortic atherosclerosis (HCC); Diverticulosis of colon; Constipation; Bilateral atelectasis; Fatty liver disease, nonalcoholic; Hydronephrosis of right kidney; Abdominal aortic ectasia (HCC); Overactive bladder; Neuralgia neuritis, sciatic nerve; Herniated nucleus pulposus; Benign neoplasm of  sigmoid colon; Benign neoplasm of cecum; Benign neoplasm of ascending colon; Encephalomalacia on imaging study; Calcification of both carotid arteries; Hypothyroid; COPD (chronic obstructive pulmonary disease) (HCC); Chronic low back pain (1ry area of Pain) (Bilateral) (R>L) w/o sciatica; Gait abnormality; Chronic knee pain (Right); Carcinoma of lower-inner quadrant of left breast in female, estrogen receptor positive (HCC); Dysphagia; Thickening of esophagus; Stomach irritation; Gastric polyp; Positive occult stool blood test; Polyp of colon; Unstable angina (HCC); Hyperkalemia; Proteinuria; Diabetes mellitus, type II (HCC); Anemia; Asthma; History of cardiac catheterization; Left breast cancer s/p mastectomy; History of total knee arthroplasty; Malignant tumor of breast (HCC); Fall; Lung nodule; Atherosclerosis of native arteries of extremity with intermittent claudication (HCC); Cervical radiculopathy; Sacroiliac inflammation (HCC); Stage 3b chronic kidney disease (HCC); Acute respiratory failure with hypoxia (HCC); Chronic anticoagulation (Eliquis); Elevated lipase; Osteoarthritis of spine with radiculopathy, cervical region; Osteoarthritis of spine with radiculopathy, thoracic region; Primary cancer of left upper lobe of lung (HCC); Acute metabolic encephalopathy; Personal history of pulmonary embolism; Type 2 diabetes mellitus with diabetic peripheral angiopathy without gangrene (HCC); Type 2 diabetes mellitus with diabetic chronic kidney disease (HCC); Atherosclerosis of aorta (HCC); Obesity, unspecified; Long term (current) use of inhaled steroids; Coronary artery disease involving native coronary artery of native heart without angina pectoris; Other disorders of phosphorus metabolism; Chronic pain syndrome; Metabolic encephalopathy; Long term (current) use of anticoagulants; Hypomagnesemia; Hypertensive chronic kidney disease w stg 1-4/unsp chr kdny; Body mass index (BMI) 33.0-33.9, adult; Chronic kidney  disease, stage 3b (HCC); Dvrtclos of intest, part unsp, w/o perf or abscess w/o bleed; Emphysema, unspecified (HCC); Fatty (change of) liver, not elsewhere classified; Pharmacologic therapy; Disorder of skeletal system; Problems influencing health status; Grade 1 Anterolisthesis of cervical spine (C3/C4, C4/C5); Abnormal MRI, cervical spine (11/16/2021); Cervical spinal stenosis (C5-6, C6-7); Cervical foraminal stenosis; Cervical facet arthropathy; Cervical facet hypertrophy (Multilevel); Abnormal x-ray of cervical spine (10/01/2020); Osteoarthritis of  AC (acromioclavicular) joint (Left); Osteoarthritis of glenohumeral joint (Left); Primary osteoarthritis of shoulder (Left); Neurostimulator device in situ (T8-9 leads); Grade 1 Anterolisthesis of lumbar spine (L4/L5); Grade 1 Retrolisthesis of lumbar spine (L2/L3, L3/L4); DDD (degenerative disc disease), cervical; DDD (degenerative disc disease), lumbar; Abnormal MRI, lumbar spine (04/17/2017); History of lumbar spinal fusion; Failed back surgical syndrome; Abnormal CT scan, lumbar spine (02/14/2019); Primary osteoarthritis of knee (Right); Primary osteoarthritis of knee (Left); Chronic knee pain (3ry area of Pain) (Bilateral) (R>L); Coccygodynia; Chronic lower extremity pain (2ry area of Pain) (Right); Leg cramps; Chronic knee pain s/p TKR (Right); Chronic neck pain (4th area of Pain) (Bilateral) (R>L); and Spinal cord stimulator dysfunction, sequela on their problem list. Her primarily concern today is the No chief complaint on file.  Pain Assessment: Location:     Radiating:   Onset:   Duration:   Quality:   Severity:  /10 (subjective, self-reported pain score)  Effect on ADL:   Timing:   Modifying factors:   BP:    HR:    Tammy [name] comes in today for a follow-up visit after her initial evaluation on 06/28/2022. Today we went over the results of her tests. These were explained in "Layman's terms". During today's appointment we went over my diagnostic  impression, as well as the proposed treatment plan.  Review of initial evaluation (06/28/2022): "According to the patient the primary area of pain is that of the lower back (Bilateral) (R>L).  The patient has a history of 4 prior back surgeries with the first 1 having taken place around 2014 and the last one around 2018.  The patient indicates last having had physical therapy approximately 8 months ago and also refers having had some imaging studies of her lumbar spine within the last 2 years.  The patient denies ever having had any nerve blocks in the lumbar spine.  Today she refers not really having any referred pain down the legs from her lower back.  However she does indicate occasionally having right lower extremity pain.  The patient indicates having a [employer] spinal cord stimulator that was implanted many years ago by Dr. Rockwell Germany, a [facility] surgeon.  She states that during the trial the device worked well, but after the implant it never worked and she has not been using it.  She also states that she stopped recharging the device.  The exact implant date was December 01, 2016 (Serial Number: [id number]) (Model Number: [id number]).  Based on the description of the imaging studies, she seems to have extensive lumbar hardware secondary to the fusion.   The patient's secondary area of pain is that of the lower extremity pain (Right).  This pain is intermittent in nature.  She denies currently having any pain down the leg.  She indicates having had surgery in the legs where she had stents put in for peripheral vascular disease.  In addition to that, she also refers having had a right total knee replacement.  She describes this pain is intermittent and she also describes that it stops at her ankle and does not go into her foot.  She denies any numbness but she does admit to some subjective weakness of the lower extremities which she thinks may be secondary to deconditioning.  She comes into the clinic today  on a wheelchair primarily because of the pain.  She does admit having a lot of difficulty ambulating, again because of the pain.  In terms of the pain pattern she refers that the  pain seems to go down the leg through the anterior aspect of the leg but then it turns into her calf.  She indicates having occasional cramps in her calves.   The patient's third area pain is that of the knee (Right).  She describes occasionally having some pain in both knees but primarily her problems with her right knee.  She already had a total knee replacement around June 2022 but she refers still having some pain in the anterior aspect of the knee over the knee.  She admits having had a prior surgery in the form of a total knee replacement, having had pulse surgical physical therapy, also having had x-rays done less than 2 years ago, but she denies any recent nerve blocks or joint injections.   The patient's fourth area pain is that of the neck (posterior) (Bilateral) (R>L).  She denies any cervical spine surgery, nerve blocks, physical therapy, and she is not sure about any recent x-rays.  She describes occasionally having some left upper extremity numbness.   From the medical standpoint, the patient is on Eliquis anticoagulation secondary to a stroke that she had more than 3 years ago with currently no residual symptoms.  She also has a history of peripheral vascular disease with lower extremity stents.  She also has insulin-dependent diabetes mellitus and allergy to contrast dye, iodine, most opioids, and naproxen.  She is unable to take NSAIDs secondary to her anticoagulation.   Today I have explained to the patient that the immediate plan is to order flexion and extension x-rays of the cervical and lumbar spine to evaluate her spondylolisthesis, as well asked to set up an appointment with the [employer] representative to see if by any chance they can analyze and program her spinal cord stimulator to capture the area that  she is interested in having treated.  The patient also stated that her spinal cord stimulator is currently not charged and today I have instructed her to recharge the device and have it ready for the next appointment so that we can evaluate the programming and perhaps make some changes.  She understood and accepted.  Lab work will be ordered today to evaluate for possible reasons for her cramps."  Review of tests ordered on 06/28/2022: ***  Patient presented with interventional treatment options. Ms. Laforte was informed that I will not be providing medication management. Pharmacotherapy evaluation including recommendations may be offered, if specifically requested.   Controlled Substance Pharmacotherapy Assessment REMS (Risk Evaluation and Mitigation Strategy)  Opioid Analgesic:  No chronic opioid analgesics therapy prescribed by our practice. None MME/day: 0 mg/day  Pill Count: None expected due to no prior prescriptions written by our practice. No notes on file Pharmacokinetics: Liberation and absorption (onset of action): WNL Distribution (time to peak effect): WNL Metabolism and excretion (duration of action): WNL         Pharmacodynamics: Desired effects: Analgesia: Ms. Brinkley reports >50% benefit. Functional ability: Patient reports that medication allows her to accomplish basic ADLs Clinically meaningful improvement in function (CMIF): Sustained CMIF goals met Perceived effectiveness: Described as relatively effective, allowing for increase in activities of daily living (ADL) Undesirable effects: Side-effects or Adverse reactions: None reported Monitoring: [place] PMP: PDMP reviewed during this encounter. Online review of the past 70-month period previously conducted. Not applicable at this point since we have not taken over the patient's medication management yet. List of other Serum/Urine Drug Screening Test(s):  No results found for: "AMPHSCRSER", "BARBSCRSER", "BENZOSCRSER",  "COCAINSCRSER", "COCAINSCRNUR", "PCPSCRSER", "THCSCRSER", "  THCU", "CANNABQUANT", "OPIATESCRSER", "OXYSCRSER", "PROPOXSCRSER", "ETH", "CBDTHCR", "D8THCCBX", "D9THCCBX" List of all UDS test(s) done:  Lab Results  Component Value Date   SUMMARY Note 06/28/2022   Last UDS on record: Summary  Date Value Ref Range Status  06/28/2022 Note  Final    Comment:    ==================================================================== Compliance Drug Analysis, Ur ==================================================================== Test                             Result       Flag       Units  Drug Present and Declared for Prescription Verification   Gabapentin                     PRESENT      EXPECTED   Duloxetine                     PRESENT      EXPECTED   Acetaminophen                  PRESENT      EXPECTED   Metoprolol                     PRESENT      EXPECTED ==================================================================== Test                      Result    Flag   Units      Ref Range   Creatinine              133              mg/dL      >=69 ==================================================================== Declared Medications:  The flagging and interpretation on this report are based on the  following declared medications.  Unexpected results may arise from  inaccuracies in the declared medications.   **Note: The testing scope of this panel includes these medications:   Duloxetine (Cymbalta)  Gabapentin (Neurontin)  Metoprolol (Lopressor)   **Note: The testing scope of this panel does not include small to  moderate amounts of these reported medications:   Acetaminophen (Tylenol)   **Note: The testing scope of this panel does not include the  following reported medications:   Albuterol  Anastrozole (Arimidex)  Apixaban (Eliquis)  Calcium  Fluticasone (Flonase)  Fluticasone (Trelegy)  Glipizide (Glucotrol)  Insulin Evaristo Bury)  Isosorbide (Imdur)  Levothyroxine  (Synthroid)  Mirabegron (Myrbetriq)  Montelukast (Singulair)  Nitroglycerin (Nitrostat)  Ondansetron (Zofran)  Pantoprazole (Protonix)  Rosuvastatin (Crestor)  Supplement  Tirzepatide (Mounjaro)  Trimethoprim  Umeclidinium (Trelegy)  Vilanterol (Trelegy)  Vitamin D  Vitamin D3  Zinc ==================================================================== For clinical consultation, please call [phone]. ====================================================================    UDS interpretation: No unexpected findings.          Medication Assessment Form: Not applicable. No opioids. Treatment compliance: Not applicable Risk Assessment Profile: Aberrant behavior: See initial evaluations. None observed or detected today Comorbid factors increasing risk of overdose: See initial evaluation. No additional risks detected today Opioid risk tool (ORT):     06/28/2022   10:24 AM  Opioid Risk   Alcohol 0  Illegal Drugs 0  Rx Drugs 4  Alcohol 0  Illegal Drugs 0  Rx Drugs 0  Age between 16-45 years  0  Psychological Disease 0  Depression 1  Opioid Risk Tool Scoring 5  Opioid Risk Interpretation Moderate Risk    ORT Scoring  interpretation table:  Score <3 = Low Risk for SUD  Score between 4-7 = Moderate Risk for SUD  Score >8 = High Risk for Opioid Abuse   Risk of substance use disorder (SUD): Low  Risk Mitigation Strategies:  Patient opioid safety counseling: No controlled substances prescribed. Patient-Prescriber Agreement (PPA): No agreement signed.  Controlled substance notification to other providers: None required. No opioid therapy.  Pharmacologic Plan: Non-opioid analgesic therapy offered. Interventional alternatives discussed.             Laboratory Chemistry Profile   Renal Lab Results  Component Value Date   BUN 25 07/10/2022   CREATININE 1.62 (H) 07/10/2022   BCR 15 07/10/2022   GFRAA 32 (L) 02/10/2020   GFRNONAA 49 (L) 03/10/2022   SPECGRAV 1.025  09/08/2021   PHUR 6.0 09/08/2021   PROTEINUR 100 (A) 02/24/2022     Electrolytes Lab Results  Component Value Date   NA 141 07/10/2022   K 4.9 07/10/2022   CL 104 07/10/2022   CALCIUM 9.3 07/10/2022   MG 2.2 06/28/2022   PHOS 2.6 03/10/2022     Hepatic Lab Results  Component Value Date   AST 18 06/28/2022   ALT 22 03/16/2022   ALBUMIN 4.0 06/28/2022   ALKPHOS 91 06/28/2022   LIPASE 62 (H) 06/08/2021     ID Lab Results  Component Value Date   SARSCOV2NAA NEGATIVE 03/08/2022     Bone Lab Results  Component Value Date   VD25OH 45 11/23/2021   25OHVITD1 42 06/28/2022   25OHVITD2 <1.0 06/28/2022   25OHVITD3 41 06/28/2022     Endocrine Lab Results  Component Value Date   GLUCOSE 123 (H) 07/10/2022   GLUCOSEU >=500 (A) 02/24/2022   HGBA1C 6.5 (H) 06/20/2022   TSH 0.812 03/16/2022     Neuropathy Lab Results  Component Value Date   VITAMINB12 1,036 06/28/2022   FOLATE 8.9 03/26/2017   HGBA1C 6.5 (H) 06/20/2022     CNS No results found for: "COLORCSF", "APPEARCSF", "RBCCOUNTCSF", "WBCCSF", "POLYSCSF", "LYMPHSCSF", "EOSCSF", "PROTEINCSF", "GLUCCSF", "JCVIRUS", "CSFOLI", "IGGCSF", "LABACHR", "ACETBL"   Inflammation (CRP: Acute  ESR: Chronic) Lab Results  Component Value Date   CRP 5 06/28/2022   ESRSEDRATE 26 06/28/2022   LATICACIDVEN 0.9 03/08/2022     Rheumatology Lab Results  Component Value Date   LABURIC 8.0 (H) 01/15/2014     Coagulation Lab Results  Component Value Date   INR 1.1 03/08/2022   LABPROT 13.9 03/08/2022   APTT 30 04/17/2020   PLT 412 03/16/2022   DDIMER 2.08 (H) 03/09/2022     Cardiovascular Lab Results  Component Value Date   BNP 58.6 03/07/2022   TROPONINI <0.03 03/11/2018   HGB 13.7 03/16/2022   HCT 42.9 03/16/2022     Screening Lab Results  Component Value Date   SARSCOV2NAA NEGATIVE 03/08/2022   COVIDSOURCE NASOPHARYNGEAL 08/22/2018     Cancer No results found for: "CEA", "CA125", "LABCA2"   Allergens No  results found for: "ALMOND", "APPLE", "ASPARAGUS", "AVOCADO", "BANANA", "BARLEY", "BASIL", "BAYLEAF", "GREENBEAN", "LIMABEAN", "WHITEBEAN", "BEEFIGE", "REDBEET", "BLUEBERRY", "BROCCOLI", "CABBAGE", "MELON", "CARROT", "CASEIN", "CASHEWNUT", "CAULIFLOWER", "CELERY"     Note: Lab results reviewed.  Recent Diagnostic Imaging Review  Cervical Imaging: Cervical MR wo contrast: Results for orders placed during the hospital encounter of 11/15/21 MR Cervical Spine Wo Contrast  Narrative CLINICAL DATA:  Osteoarthritis of spine with radiculopathy, cervical region M47.22 (ICD-10-CM).  Osteoarthritis of spine with radiculopathy, thoracic region M47.24 (ICD-10-CM); Other intervertebral disc degeneration, thoracic region M51.34 (ICD-10-CM).  EXAM: MRI CERVICAL AND THORACIC SPINE WITHOUT CONTRAST  TECHNIQUE: Multiplanar and multiecho pulse sequences of the cervical spine, to include the craniocervical junction and cervicothoracic junction, and the thoracic spine, were obtained without intravenous contrast.  COMPARISON:  MRI cervical spine October 19, 2015.  Radiographs August 23, 2021.  FINDINGS: MRI CERVICAL SPINE FINDINGS  Alignment: Small anterolisthesis of C3 over C4 and C4 over C5. Exaggerated upper cervical lordosis.  Vertebrae: No fracture, evidence of discitis, or bone lesion.  Cord: Mild mass effect on the cord at C5-6 and C6-7. No cord signal abnormality.  Posterior Fossa, vertebral arteries, paraspinal tissues: Retro odontoid mild degenerative changes with small right posterolateral synovial cyst projecting into the spinal canal and causing mild indentation on the thecal sac.  Disc levels: Degree of spinal canal or neural foraminal stenosis has not significantly changed compared to prior MRI, as detailed below.  C2-3: Facet degenerative changes. No significant spinal canal or neural foraminal stenosis.  C3-4: Mild disc bulge without significant spinal canal  stenosis. Uncovertebral and facet degenerative changes resulting in moderate bilateral neural foraminal narrowing.  C4-5: Mild disc bulge without significant spinal canal stenosis. Uncovertebral and facet degenerative changes with prominent left facet hypertrophy resulting in moderate bilateral neural foraminal narrowing, left greater than right.  C5-6: Posterior disc osteophyte complex, asymmetric to the right, resulting in mild spinal canal stenosis. Uncovertebral and facet degenerative changes resulting in severe right and mild left neural foraminal narrowing.  C6-7: Posterior disc osteophyte complex resulting in mild spinal canal stenosis. Uncovertebral and facet degenerative changes resulting in mild right and moderate left neural foraminal narrowing.  C7-T1: No spinal canal or neural foraminal stenosis.  MRI THORACIC SPINE FINDINGS  Alignment:  Normal.  Vertebrae: No fracture, evidence of discitis, or bone lesion.  Cord:  Normal signal and morphology.  Paraspinal and other soft tissues: The lower thoracic/upper abdominal aorta measures approximately 3.5 cm.  Disc levels:  No significant disc bulge or herniation, spinal canal or neural foraminal stenosis at any level.  Spinal cord stimulator is seen entering the spinal canal posteriorly at the T10-11 space extending into the T8-9 level.  IMPRESSION: 1. Degenerative changes of the cervical spine with mild spinal canal stenosis at C5-6 and C6-7. 2. Multilevel neural foraminal narrowing of the cervical spine, severe on the right at C5-6, moderate bilaterally at C3-4 and C4-5 and on the left at C6-7. 3. No significant spinal canal or neural foraminal stenosis of the thoracic spine. 4. Aortic lower thoracic/upper abdominal aneurysm measuring up to 3.5 cm, similar to CT of the abdomen obtained March 22, 2021.   Electronically Signed By: Baldemar Lenis M.D. On: 11/16/2021 17:25  Cervical DG  Bending/F/E views: Results for orders placed during the hospital encounter of 07/04/22 DG Cervical Spine With Flex & Extend  Narrative CLINICAL DATA:  Cervicalgia  EXAM: CERVICAL SPINE COMPLETE WITH FLEXION AND EXTENSION 7 VIEWS  COMPARISON:  09/30/2020.  FINDINGS: No fracture, dislocation or subluxation. No spondylolisthesis. No osteolytic or osteoblastic changes. Prevertebral and cervical cranial soft tissues are unremarkable. No motion with flexion and extension to suggest instability.  Degenerative disc disease noted with disc space narrowing and marginal osteophytes at C5-T1.  IMPRESSION: Degenerative changes. No acute osseous abnormalities.   Electronically Signed By: Layla Maw M.D. On: 07/08/2022 16:17  Shoulder Imaging: Shoulder-L DG: Results for orders placed during the hospital encounter of 09/30/20 DG Shoulder Left  Narrative CLINICAL DATA:  Acute left-sided thoracic back pain. Left shoulder pain. Pain below  left scapula.  EXAM: LEFT SHOULDER - 2+ VIEW  COMPARISON:  None.  FINDINGS: There is no evidence of fracture or dislocation. Mild acromioclavicular spurring with inferiorly directed osteophytes. Minimal glenohumeral spurring. Glenohumeral joint space is preserved. No erosion, bony destruction, or evidence of focal lesion. Soft tissues are unremarkable.  IMPRESSION: Mild acromioclavicular and glenohumeral osteoarthritis.   Electronically Signed By: Narda Rutherford M.D. On: 10/01/2020 19:50  Thoracic Imaging: Thoracic MR wo contrast: Results for orders placed during the hospital encounter of 11/15/21 MR Thoracic Spine Wo Contrast  Narrative CLINICAL DATA:  Osteoarthritis of spine with radiculopathy, cervical region M47.22 (ICD-10-CM).  Osteoarthritis of spine with radiculopathy, thoracic region M47.24 (ICD-10-CM); Other intervertebral disc degeneration, thoracic region M51.34 (ICD-10-CM).  EXAM: MRI CERVICAL AND THORACIC SPINE  WITHOUT CONTRAST  TECHNIQUE: Multiplanar and multiecho pulse sequences of the cervical spine, to include the craniocervical junction and cervicothoracic junction, and the thoracic spine, were obtained without intravenous contrast.  COMPARISON:  MRI cervical spine October 19, 2015.  Radiographs August 23, 2021.  FINDINGS: MRI CERVICAL SPINE FINDINGS  Alignment: Small anterolisthesis of C3 over C4 and C4 over C5. Exaggerated upper cervical lordosis.  Vertebrae: No fracture, evidence of discitis, or bone lesion.  Cord: Mild mass effect on the cord at C5-6 and C6-7. No cord signal abnormality.  Posterior Fossa, vertebral arteries, paraspinal tissues: Retro odontoid mild degenerative changes with small right posterolateral synovial cyst projecting into the spinal canal and causing mild indentation on the thecal sac.  Disc levels: Degree of spinal canal or neural foraminal stenosis has not significantly changed compared to prior MRI, as detailed below.  C2-3: Facet degenerative changes. No significant spinal canal or neural foraminal stenosis.  C3-4: Mild disc bulge without significant spinal canal stenosis. Uncovertebral and facet degenerative changes resulting in moderate bilateral neural foraminal narrowing.  C4-5: Mild disc bulge without significant spinal canal stenosis. Uncovertebral and facet degenerative changes with prominent left facet hypertrophy resulting in moderate bilateral neural foraminal narrowing, left greater than right.  C5-6: Posterior disc osteophyte complex, asymmetric to the right, resulting in mild spinal canal stenosis. Uncovertebral and facet degenerative changes resulting in severe right and mild left neural foraminal narrowing.  C6-7: Posterior disc osteophyte complex resulting in mild spinal canal stenosis. Uncovertebral and facet degenerative changes resulting in mild right and moderate left neural foraminal narrowing.  C7-T1: No spinal canal  or neural foraminal stenosis.  MRI THORACIC SPINE FINDINGS  Alignment:  Normal.  Vertebrae: No fracture, evidence of discitis, or bone lesion.  Cord:  Normal signal and morphology.  Paraspinal and other soft tissues: The lower thoracic/upper abdominal aorta measures approximately 3.5 cm.  Disc levels:  No significant disc bulge or herniation, spinal canal or neural foraminal stenosis at any level.  Spinal cord stimulator is seen entering the spinal canal posteriorly at the T10-11 space extending into the T8-9 level.  IMPRESSION: 1. Degenerative changes of the cervical spine with mild spinal canal stenosis at C5-6 and C6-7. 2. Multilevel neural foraminal narrowing of the cervical spine, severe on the right at C5-6, moderate bilaterally at C3-4 and C4-5 and on the left at C6-7. 3. No significant spinal canal or neural foraminal stenosis of the thoracic spine. 4. Aortic lower thoracic/upper abdominal aneurysm measuring up to 3.5 cm, similar to CT of the abdomen obtained March 22, 2021.   Electronically Signed By: Baldemar Lenis M.D. On: 11/16/2021 17:25  Thoracic DG w/swimmers view: Results for orders placed during the hospital encounter of 09/30/20 DG  Thoracic Spine W/Swimmers  Narrative CLINICAL DATA:  Acute left-sided thoracic back pain. Left shoulder pain.  EXAM: THORACIC SPINE - 3 VIEWS  COMPARISON:  None.  FINDINGS: Spinal stimulator is in place with leads at the level of T8-T9. The alignment is maintained. Vertebral body heights are maintained. Mild disc space narrowing and endplate spurring in the mid lower thoracic spine, most prominent at T10-T11. There is no evidence of fracture, focal bone lesion or bone destruction. Posterior elements appear intact. There is no paravertebral soft tissue abnormality.  IMPRESSION: 1. Spinal stimulator in place with lead tips at the level of T8-T9. 2. Mild degenerative disc disease in the mid and  lower thoracic spine.   Electronically Signed By: Narda Rutherford M.D. On: 10/01/2020 19:49  Lumbosacral Imaging: Lumbar MR wo contrast: Results for orders placed during the hospital encounter of 04/17/17 MR Lumbar Spine Wo Contrast  Narrative CLINICAL DATA:  History of back surgery. Spinal stenosis of the lumbar region with recurrent radiculopathy. Low back pain extending into both lower extremities, right greater than left.  EXAM: MRI LUMBAR SPINE WITHOUT CONTRAST  TECHNIQUE: Multiplanar, multisequence MR imaging of the lumbar spine was performed. No intravenous contrast was administered.  COMPARISON:  CT abdomen and pelvis 04/08/2015 MRI lumbar spine 10/04/2011.  FINDINGS: Segmentation: 5 non rib-bearing lumbar type vertebral bodies are present.  Alignment: Slight anterolisthesis is again noted at L4-5. Previous retrolisthesis at L3-4 was reduced. Slight retrolisthesis at L2-3 is stable.  Vertebrae: Marrow signal is within normal limits following extensive posterior lumbar fusion.  Conus medullaris and cauda equina: Conus extends to the L1-2 level. Conus and cauda equina appear normal.  Paraspinal and other soft tissues: Chronic right-sided renal atrophy and hydronephrosis is stable. The left kidney is within normal limits. No other focal lesions are present. No significant adenopathy is present.  Disc levels:  L1-2: Moderate facet hypertrophy is present bilaterally. There is no significant disc protrusion or stenosis.  L2-3: Discectomy and fusion are noted. Posterior pedicle screws are in place. No residual recurrent stenosis is evident.  L3-4: Discectomy and fusion are noted. No residual recurrent stenosis is present. Foramina are patent bilaterally.  L4-5: Discectomy and fusion are noted. Left laminectomy is present. No residual recurrent stenosis is present. The foramina are patent bilaterally.  L5-S1: Discectomy and fusion are noted. Left  laminectomy is present. There is no residual recurrent stenosis.  IMPRESSION: 1. Interval lumbar fusion at L2-3, L3-4, L4-5, and L5-S1 without residual or recurrent stenosis at these levels. 2. Mild facet hypertrophy at L1-2 is not significantly changed. There is no significant disc protrusion or stenosis at this level. 3. No significant residual or recurrent spinal stenosis to explain the patient's radicular symptoms. 4. Chronic right sided hydronephrosis with parenchymal atrophy.   Electronically Signed By: Marin Roberts M.D. On: 04/17/2017 15:20  Lumbar CT wo contrast: Results for orders placed during the hospital encounter of 02/14/19 CT LUMBAR SPINE WO CONTRAST  Narrative CLINICAL DATA:  Low back pain after standing for extended periods of time  EXAM: CT LUMBAR SPINE WITHOUT CONTRAST  TECHNIQUE: Multidetector CT imaging of the lumbar spine was performed without intravenous contrast administration. Multiplanar CT image reconstructions were also generated.  COMPARISON:  MRI 04/17/2017, CT 04/08/2015, report 01/31/2019  FINDINGS: Segmentation: 5 lumbar type vertebrae.  Alignment: Trace anterolisthesis L4 on L5 without significant change. Alignment is otherwise within normal limits.  Vertebrae: Minimal loss of height anteriorly of L4, appears chronic to 2019 MRI. No acute fracture is visualized.  Paraspinal and other soft tissues: Proximal suprarenal abdominal aortic aneurysm measuring up to 3.5 cm on coronal views. Chronic hydronephrosis right kidney. Mild hydronephrosis left kidney. Sigmoid colon diverticular disease. Mild fatty atrophy of the paraspinous muscles. Incompletely visualized spinal stimulator leads at T11.  Disc levels:  At T12-L1, maintained disc space. No canal stenosis. Foramen are patent bilaterally.  At L1-L2, maintained disc space. No significant canal stenosis. Foramen are patent bilaterally. Mild facet degenerative changes.  At  L2-L3, interbody device with posterior fusion rod. Pedicle screws appear in place. No lucency. Some flattening of the thecal sac without high-grade canal stenosis. Facet degenerative changes. Foramen appear patent bilaterally.  At L3-L4, interbody device with posterior fusion rods and pedicle screws. Screws remain in place. No lucency. No high-grade canal stenosis. Some flattening of the anterior thecal sac. Facet degenerative changes. Foramen appear grossly patent.  At L4-L5, interbody device. No bridging spinal rod. Pedicle screws remain and pays. No surrounding lucency. No high-grade canal stenosis. Mild foraminal narrowing bilaterally. Facet degenerative changes.  At L5-S1, interbody device. Posterior rod and fixating screws without lucency. No high-grade canal stenosis. The foramen appear mildly narrowed.  IMPRESSION: 1. Prior lumbar fusion with posterior spinal rods and transpedicular screws L2 through L4 and L5 through S1 with non contiguous appearance of the posterior spinal rods at L4-L5. Hardware appears grossly intact, no definitive lucency to suggest loosening. 2. Stable lumbar alignment with trace anterolisthesis L4 on L5. No high-grade canal stenosis or foraminal narrowing by CT. 3. Suprarenal abdominal aortic aneurysm up to 3.5 cm. Recommend followup by ultrasound in 2 years. This recommendation follows ACR consensus guidelines: White Paper of the ACR Incidental Findings Committee II on Vascular Findings. J Am Coll Radiol 2013; 10:789-794. Aortic aneurysm NOS (ICD10-I71.9) 4. Chronic right hydronephrosis. 5. Sigmoid colon diverticular disease without acute inflammatory change   Electronically Signed By: Jasmine Pang M.D. On: 02/14/2019 15:41  Lumbar DG Bending views: Results for orders placed during the hospital encounter of 07/04/22 DG Lumbar Spine Complete W/Bend  Narrative CLINICAL DATA:  Pain  EXAM: LUMBAR SPINE - COMPLETE WITH BENDING  VIEWS  COMPARISON:  04/08/2019.  FINDINGS: Alignment normal. Patient is status post posterior pedicle fusion and discectomies L2 through S1. No motion with flexion and extension to suggest instability. No motion with flexion and extension to suggest instability. Thoracic spine stimulation wires noted.  IMPRESSION: Postop changes L2-S1.  No acute osseous abnormalities identified.   Electronically Signed By: Layla Maw M.D. On: 07/08/2022 16:19  Knee Imaging: Knee-R DG 4 views: Results for orders placed during the hospital encounter of 02/25/18 DG Knee Complete 4 Views Right  Narrative CLINICAL DATA:  77 year old female with bilateral knee pain worse on the right. No known injury. Initial encounter.  EXAM: RIGHT KNEE - COMPLETE 4+ VIEW  COMPARISON:  None.  FINDINGS: Moderate to marked medial tibiofemoral joint degenerative changes and joint space narrowing. Slight widening lateral tibiofemoral joint space. Mild to slightly moderate patellofemoral joint degenerative changes. No fracture or dislocation. No joint effusion.  IMPRESSION: 1. Moderate to marked medial tibiofemoral joint degenerative changes and joint space narrowing. 2. Slight widening lateral tibiofemoral joint space. 3. Mild to slightly moderate patellofemoral joint degenerative changes.   Electronically Signed By: Lacy Duverney M.D. On: 02/25/2018 17:28  Knee-L DG 4 views: Results for orders placed during the hospital encounter of 02/25/18 DG Knee Complete 4 Views Left  Narrative CLINICAL DATA:  77 year old female with bilateral knee pain worse on the right. No known injury.  Initial encounter.  EXAM: LEFT KNEE - COMPLETE 4+ VIEW  COMPARISON:  None.  FINDINGS: Moderate to marked medial tibiofemoral joint space narrowing and degenerative changes. Widening lateral tibiofemoral joint space. Mild to slightly moderate patellofemoral joint degenerative changes. No fracture or dislocation. No  significant joint effusion.  IMPRESSION: 1. Moderate to marked medial tibiofemoral joint space narrowing and degenerative changes. 2. Widening lateral tibiofemoral joint space. 3. Mild to slightly moderate patellofemoral joint degenerative changes.   Electronically Signed By: Lacy Duverney M.D. On: 02/25/2018 17:24  Complexity Note: Imaging results reviewed.                         Meds   Current Outpatient Medications:    acetaminophen (TYLENOL) 500 MG tablet, Take 500 mg by mouth every 6 (six) hours as needed., Disp: , Rfl:    albuterol (PROVENTIL) (2.5 MG/3ML) 0.083% nebulizer solution, Take 3 mLs (2.5 mg total) by nebulization every 6 (six) hours as needed for wheezing or shortness of breath., Disp: 225 mL, Rfl: 12   albuterol (VENTOLIN HFA) 108 (90 Base) MCG/ACT inhaler, Inhale 2 puffs into the lungs every 6 (six) hours as needed for wheezing or shortness of breath., Disp: 18 g, Rfl: 3   anastrozole (ARIMIDEX) 1 MG tablet, TAKE 1 TABLET BY MOUTH DAILY, Disp: 90 tablet, Rfl: 0   apixaban (ELIQUIS) 2.5 MG TABS tablet, Take 1 tablet (2.5 mg total) by mouth 2 (two) times daily., Disp: 180 tablet, Rfl: 1   Calcium Carb-Cholecalciferol (CALCIUM 600 + D) 600-200 MG-UNIT TABS, Take 1 tablet by mouth daily. , Disp: , Rfl:    cholecalciferol (VITAMIN D3) 25 MCG (1000 UT) tablet, Take 1,000 Units by mouth daily., Disp: , Rfl:    CRANBERRY PO, Take 2 capsules by mouth daily., Disp: , Rfl:    DULoxetine (CYMBALTA) 60 MG capsule, Take 2 capsules (120 mg total) by mouth daily., Disp: 180 capsule, Rfl: 1   fluticasone (FLONASE) 50 MCG/ACT nasal spray, Place 1 spray into both nostrils daily., Disp: 16 g, Rfl: 2   Fluticasone-Umeclidin-Vilant (TRELEGY ELLIPTA) 100-62.5-25 MCG/ACT AEPB, Inhale 1 puff into the lungs daily., Disp: 3 each, Rfl: 4   gabapentin (NEURONTIN) 300 MG capsule, Take 2 capsules (600 mg total) by mouth 3 (three) times daily., Disp: 540 capsule, Rfl: 1   glipiZIDE (GLUCOTROL) 5  MG tablet, TAKE 1 TABLET BY MOUTH DAILY BEFORE BREAKFAST, Disp: 90 tablet, Rfl: 1   isosorbide mononitrate (IMDUR) 60 MG 24 hr tablet, TAKE ONE TABLET BY MOUTH AT BEDTIME, Disp: 90 tablet, Rfl: 1   metoprolol tartrate (LOPRESSOR) 25 MG tablet, Take 1 tablet (25 mg total) by mouth 2 (two) times daily., Disp: 180 tablet, Rfl: 1   montelukast (SINGULAIR) 10 MG tablet, Take 1 tablet (10 mg total) by mouth at bedtime., Disp: 90 tablet, Rfl: 1   MYRBETRIQ 50 MG TB24 tablet, TAKE 1 TABLET BY MOUTH DAILY, Disp: 30 tablet, Rfl: 2   nitroGLYCERIN (NITROSTAT) 0.4 MG SL tablet, Place 1 tablet (0.4 mg total) under the tongue every 5 (five) minutes as needed for chest pain. Total of 3 doses, Disp: 25 tablet, Rfl: 6   ondansetron (ZOFRAN) 4 MG tablet, Take 1 tablet (4 mg total) by mouth every 6 (six) hours as needed for nausea., Disp: 20 tablet, Rfl: 0   pantoprazole (PROTONIX) 40 MG tablet, TAKE 1 TABLET BY MOUTH DAILY, Disp: 90 tablet, Rfl: 1   Pumpkin Seed-Soy Germ (AZO BLADDER CONTROL/GO-LESS PO), Take 1  tablet by mouth in the morning and at bedtime., Disp: , Rfl:    rosuvastatin (CRESTOR) 40 MG tablet, Take 1 tablet (40 mg total) by mouth daily., Disp: 90 tablet, Rfl: 1   SYNTHROID 150 MCG tablet, Take 1 tablet (150 mcg total) by mouth daily before breakfast., Disp: 90 tablet, Rfl: 3   tirzepatide (MOUNJARO) 12.5 MG/0.5ML Pen, Inject 12.5 mg into the skin once a week., Disp: 6 mL, Rfl: 1   TRESIBA FLEXTOUCH 100 UNIT/ML FlexTouch Pen, Inject into the skin., Disp: , Rfl:    trimethoprim (TRIMPEX) 100 MG tablet, TAKE ONE TABLET BY MOUTH EVERY DAY, Disp: 90 tablet, Rfl: 1   UNABLE TO FIND, Med Name: Energy, memory and mood enhancer, Disp: , Rfl:    ZINC CITRATE PO, Take 1 tablet by mouth daily., Disp: , Rfl:   ROS  Constitutional: Denies any fever or chills Gastrointestinal: No reported hemesis, hematochezia, vomiting, or acute GI distress Musculoskeletal: Denies any acute onset joint swelling, redness, loss  of ROM, or weakness Neurological: No reported episodes of acute onset apraxia, aphasia, dysarthria, agnosia, amnesia, paralysis, loss of coordination, or loss of consciousness  Allergies  Ms. Trawick is allergic to hydrocodone, aleve [naproxen sodium], contrast media [iodinated contrast media], ioxaglate, oxycodone, ranexa [ranolazine], sulfa antibiotics, and tramadol.  PFSH  Drug: Ms. Level  reports no history of drug use. Alcohol:  reports no history of alcohol use. Tobacco:  reports that she quit smoking about 15 years ago. Her smoking use included cigarettes. She has a 7.50 pack-year smoking history. She has never used smokeless tobacco. Medical:  has a past medical history of Asthma, Benign neoplasm of colon, Breast cancer (HCC) (08/2018), Cervical disc disease, Chronic diastolic CHF (congestive heart failure) (HCC), CKD (chronic kidney disease), stage III (HCC), COPD (chronic obstructive pulmonary disease) (HCC), Coronary artery disease, Diabetes mellitus without complication (HCC), Diagnosis unknown (05/07/2022), Fusion of lumbar spine (12/22/2015), GERD (gastroesophageal reflux disease), History of DVT (deep vein thrombosis), History of tobacco abuse, Hyperlipidemia, Hypertension, Hypothyroidism, Lung cancer (HCC), Malignant neoplasm of unspecified site of right female breast (HCC) (03/14/1999), Obesity, Palpitations, Personal history of radiation therapy, PONV (postoperative nausea and vomiting), Pulmonary embolus (HCC) (12/25/2013), PVD (peripheral vascular disease) (HCC), and Stroke (HCC) (2015). Surgical: Ms. Roan  has a past surgical history that includes Angioplasty / stenting femoral; Trigger finger release; Thyroidectomy; Cholecystectomy; Cardiac catheterization (12/07/2011); Cardiac catheterization (01/04/2011); Colonoscopy (2013); laparoscopic appendectomy (N/A, 01/06/2015); Appendectomy (12/2014); Colonoscopy with propofol (N/A, 05/24/2015); polypectomy (05/24/2015); Mastectomy w/ sentinel  node biopsy (Left, 08/26/2018); Breast surgery; Diagnostic laparoscopy; Colonoscopy with propofol (N/A, 01/28/2019); Esophagogastroduodenoscopy (egd) with propofol (N/A, 01/28/2019); Back surgery (03/2012); Back surgery; Back surgery (11/2016); LEFT HEART CATH AND CORONARY ANGIOGRAPHY (Left, 04/04/2019); Total knee arthroplasty (Right, 11/13/2019); Breast lumpectomy (Left, 08/26/2018); Replacement total knee (Right); RIGHT/LEFT HEART CATH AND CORONARY ANGIOGRAPHY (Bilateral, 09/26/2021); and Cataract extraction (Right, 12/21/2021). Family: family history includes COPD in her sister and sister; Cancer in her mother and sister; Colon cancer (age of onset: 50) in her brother; Diabetes in her brother, brother, brother, and father; Heart attack in her brother, brother, brother, and father; Heart disease in her brother, brother, brother, and father; Hypertension in her brother, brother, brother, and father; Lung cancer in her brother.  Constitutional Exam  General appearance: Well nourished, well developed, and well hydrated. In no apparent acute distress There were no vitals filed for this visit. BMI Assessment: Estimated body mass index is 31.64 kg/m as calculated from the following:   Height as of 06/28/22: 5'  6" (1.676 m).   Weight as of 06/28/22: 196 lb (88.9 kg).  BMI interpretation table: BMI level Category Range association with higher incidence of chronic pain  <18 kg/m2 Underweight   18.5-24.9 kg/m2 Ideal body weight   25-29.9 kg/m2 Overweight Increased incidence by 20%  30-34.9 kg/m2 Obese (Class I) Increased incidence by 68%  35-39.9 kg/m2 Severe obesity (Class II) Increased incidence by 136%  >40 kg/m2 Extreme obesity (Class III) Increased incidence by 254%   Patient's current BMI Ideal Body weight  There is no height or weight on file to calculate BMI. Ideal body weight: 59.3 kg (130 lb 11.7 oz) Adjusted ideal body weight: 71.1 kg (156 lb 13.4 oz)   BMI Readings from Last 4 Encounters:   06/28/22 31.64 kg/m  06/20/22 31.70 kg/m  05/17/22 32.12 kg/m  05/02/22 32.28 kg/m   Wt Readings from Last 4 Encounters:  06/28/22 196 lb (88.9 kg)  06/20/22 196 lb 6.4 oz (89.1 kg)  05/17/22 199 lb (90.3 kg)  05/02/22 200 lb (90.7 kg)    Psych/Mental status: Alert, oriented x 3 (person, place, & time)       Eyes: PERLA Respiratory: No evidence of acute respiratory distress  Assessment & Plan  Primary Diagnosis & Pertinent Problem List: There were no encounter diagnoses.  Visit Diagnosis: No diagnosis found. Problems updated and reviewed during this visit: No problems updated.  Plan of Care  Pharmacotherapy (Medications Ordered): No orders of the defined types were placed in this encounter.  Procedure Orders    No procedure(s) ordered today   Lab Orders  No laboratory test(s) ordered today   Imaging Orders  No imaging studies ordered today   Referral Orders  No referral(s) requested today    Pharmacological management:  Opioid Analgesics: I will not be prescribing any opioids at this time Membrane stabilizer: I will not be prescribing any at this time Muscle relaxant: I will not be prescribing any at this time NSAID: I will not be prescribing any at this time Other analgesic(s): I will not be prescribing any at this time      Interventional Therapies  Risk Factors  Considerations:  Eliquis Anticoagulation: (Stop: 3 days  Restart: 6 hours)   Allergy: CONTRAST Dye, Iodine, Hydrocodone, Oxycodone, Tramadol, Naproxen (Aleve)  IDDM  PVD w/ LE stents  Hx. Stroke     Planned  Pending:      Under consideration:   Pending   Completed:   None at this time   Therapeutic  Palliative (PRN) options:   None established   Completed by other providers:   (06/22/2017) bilateral lower extremity EMG/PNCV (normal) by [facility]  Spinal cord stimulator implant         Provider-requested follow-up: No follow-ups on file. Recent  Visits Date Type Provider Dept  06/28/22 Office Visit Delano Metz, MD [facility]  Showing recent visits within past 90 days and meeting all other requirements Future Appointments Date Type Provider Dept  07/12/22 Appointment Delano Metz, MD [facility]  Showing future appointments within next 90 days and meeting all other requirements   Primary Care Physician: Dorcas Carrow, DO  Duration of encounter: *** minutes.  Total time on encounter, as per AMA guidelines included both the face-to-face and non-face-to-face time personally spent by the physician and/or other qualified health care professional(s) on the day of the encounter (includes time in activities that require the physician or other qualified health care professional and does not include time in  activities normally performed by clinical staff). Physician's time may include the following activities when performed: Preparing to see the patient (e.g., pre-charting review of records, searching for previously ordered imaging, lab work, and nerve conduction tests) Review of prior analgesic pharmacotherapies. Reviewing PMP Interpreting ordered tests (e.g., lab work, imaging, nerve conduction tests) Performing post-procedure evaluations, including interpretation of diagnostic procedures Obtaining and/or reviewing separately obtained history Performing a medically appropriate examination and/or evaluation Counseling and educating the patient/family/caregiver Ordering medications, tests, or procedures Referring and communicating with other health care professionals (when not separately reported) Documenting clinical information in the electronic or other health record Independently interpreting results (not separately reported) and communicating results to the patient/ family/caregiver Care coordination (not separately reported)  Note by: Oswaldo Done, MD (TTS technology used. I apologize for  any typographical errors that were not detected and corrected.) Date: 07/12/2022; Time: 8:24 AM

## 2022-07-12 ENCOUNTER — Ambulatory Visit (HOSPITAL_BASED_OUTPATIENT_CLINIC_OR_DEPARTMENT_OTHER): Payer: Medicare Other | Admitting: Pain Medicine

## 2022-07-12 ENCOUNTER — Telehealth: Payer: Self-pay

## 2022-07-12 ENCOUNTER — Ambulatory Visit
Admission: RE | Admit: 2022-07-12 | Discharge: 2022-07-12 | Disposition: A | Discharge status: Home / Self Care | Payer: Medicare Other | Source: Ambulatory Visit | Attending: Internal Medicine | Admitting: Internal Medicine

## 2022-07-12 ENCOUNTER — Encounter: Payer: Self-pay | Admitting: Pain Medicine

## 2022-07-12 VITALS — BP 101/46 | HR 72 | Temp 97.1°F | Resp 18 | Ht 66.0 in | Wt 196.0 lb

## 2022-07-12 DIAGNOSIS — J439 Emphysema, unspecified: Secondary | ICD-10-CM | POA: Diagnosis not present

## 2022-07-12 DIAGNOSIS — M79604 Pain in right leg: Secondary | ICD-10-CM | POA: Insufficient documentation

## 2022-07-12 DIAGNOSIS — G8929 Other chronic pain: Secondary | ICD-10-CM

## 2022-07-12 DIAGNOSIS — C3412 Malignant neoplasm of upper lobe, left bronchus or lung: Secondary | ICD-10-CM | POA: Insufficient documentation

## 2022-07-12 DIAGNOSIS — C50912 Malignant neoplasm of unspecified site of left female breast: Secondary | ICD-10-CM | POA: Diagnosis not present

## 2022-07-12 DIAGNOSIS — M25562 Pain in left knee: Secondary | ICD-10-CM | POA: Insufficient documentation

## 2022-07-12 DIAGNOSIS — M25561 Pain in right knee: Secondary | ICD-10-CM

## 2022-07-12 DIAGNOSIS — Z9682 Presence of neurostimulator: Secondary | ICD-10-CM

## 2022-07-12 DIAGNOSIS — C3411 Malignant neoplasm of upper lobe, right bronchus or lung: Secondary | ICD-10-CM | POA: Diagnosis not present

## 2022-07-12 DIAGNOSIS — M545 Low back pain, unspecified: Secondary | ICD-10-CM | POA: Insufficient documentation

## 2022-07-12 NOTE — Progress Notes (Cosign Needed)
Care Management & Coordination Services Pharmacy Team  Reason for Encounter: Diabetes  Contacted patient to discuss diabetes disease state. Unsuccessful outreach. Left voicemail for patient to return call.   Recent office visits:  06/20/22-Megan Holly Bodily, DO (PCP) Seen for diabetes and back problem. Labs ordered. Increase gabapentin to 600 mg. Follow up in 4 weeks.   Recent consult visits:  06/28/22-Francisco Laban Emperor, MD (Pain management) Seen for back pain. Follow up in 2 weeks.   Hospital visits:  None in previous 6 months   Medications: Outpatient Encounter Medications as of 07/12/2022  Medication Sig   acetaminophen (TYLENOL) 500 MG tablet Take 500 mg by mouth every 6 (six) hours as needed.   albuterol (PROVENTIL) (2.5 MG/3ML) 0.083% nebulizer solution Take 3 mLs (2.5 mg total) by nebulization every 6 (six) hours as needed for wheezing or shortness of breath.   albuterol (VENTOLIN HFA) 108 (90 Base) MCG/ACT inhaler Inhale 2 puffs into the lungs every 6 (six) hours as needed for wheezing or shortness of breath.   anastrozole (ARIMIDEX) 1 MG tablet TAKE 1 TABLET BY MOUTH DAILY   apixaban (ELIQUIS) 2.5 MG TABS tablet Take 1 tablet (2.5 mg total) by mouth 2 (two) times daily.   Calcium Carb-Cholecalciferol (CALCIUM 600 + D) 600-200 MG-UNIT TABS Take 1 tablet by mouth daily.    cholecalciferol (VITAMIN D3) 25 MCG (1000 UT) tablet Take 1,000 Units by mouth daily.   CRANBERRY PO Take 2 capsules by mouth daily.   DULoxetine (CYMBALTA) 60 MG capsule Take 2 capsules (120 mg total) by mouth daily.   fluticasone (FLONASE) 50 MCG/ACT nasal spray Place 1 spray into both nostrils daily.   Fluticasone-Umeclidin-Vilant (TRELEGY ELLIPTA) 100-62.5-25 MCG/ACT AEPB Inhale 1 puff into the lungs daily.   gabapentin (NEURONTIN) 300 MG capsule Take 2 capsules (600 mg total) by mouth 3 (three) times daily.   glipiZIDE (GLUCOTROL) 5 MG tablet TAKE 1 TABLET BY MOUTH DAILY BEFORE BREAKFAST   isosorbide  mononitrate (IMDUR) 60 MG 24 hr tablet TAKE ONE TABLET BY MOUTH AT BEDTIME   metoprolol tartrate (LOPRESSOR) 25 MG tablet Take 1 tablet (25 mg total) by mouth 2 (two) times daily.   montelukast (SINGULAIR) 10 MG tablet Take 1 tablet (10 mg total) by mouth at bedtime.   MYRBETRIQ 50 MG TB24 tablet TAKE 1 TABLET BY MOUTH DAILY   nitroGLYCERIN (NITROSTAT) 0.4 MG SL tablet Place 1 tablet (0.4 mg total) under the tongue every 5 (five) minutes as needed for chest pain. Total of 3 doses   ondansetron (ZOFRAN) 4 MG tablet Take 1 tablet (4 mg total) by mouth every 6 (six) hours as needed for nausea.   pantoprazole (PROTONIX) 40 MG tablet TAKE 1 TABLET BY MOUTH DAILY   Pumpkin Seed-Soy Germ (AZO BLADDER CONTROL/GO-LESS PO) Take 1 tablet by mouth in the morning and at bedtime.   rosuvastatin (CRESTOR) 40 MG tablet Take 1 tablet (40 mg total) by mouth daily.   SYNTHROID 150 MCG tablet Take 1 tablet (150 mcg total) by mouth daily before breakfast.   tirzepatide (MOUNJARO) 12.5 MG/0.5ML Pen Inject 12.5 mg into the skin once a week.   TRESIBA FLEXTOUCH 100 UNIT/ML FlexTouch Pen Inject into the skin.   trimethoprim (TRIMPEX) 100 MG tablet TAKE ONE TABLET BY MOUTH EVERY DAY   UNABLE TO FIND Med Name: Energy, memory and mood enhancer   ZINC CITRATE PO Take 1 tablet by mouth daily.   No facility-administered encounter medications on file as of 07/12/2022.    Recent Relevant  Labs: Lab Results  Component Value Date/Time   HGBA1C 6.5 (H) 06/20/2022 02:00 PM   HGBA1C 7.4 (H) 03/16/2022 01:23 PM   HGBA1C 7.5 11/23/2021 12:00 AM   HGBA1C 8.7 01/31/2016 12:00 AM   MICROALBUR 150 (H) 06/20/2022 02:00 PM   MICROALBUR 150 (H) 08/19/2020 10:11 AM    Kidney Function Lab Results  Component Value Date/Time   CREATININE 1.62 (H) 07/10/2022 10:20 AM   CREATININE 1.83 (H) 06/28/2022 11:57 AM   CREATININE 1.31 (H) 09/20/2011 11:34 AM   GFRNONAA 49 (L) 03/10/2022 09:30 AM   GFRNONAA 42 (L) 09/20/2011 11:34 AM   GFRAA  32 (L) 02/10/2020 02:40 PM   GFRAA 48 (L) 09/20/2011 11:34 AM    Current antihyperglycemic regimen:  Glipizide 5 mg take 1 tablet daily Tresiba 100 units inject under skin   Adherence Review: Is the patient currently on a STATIN medication? Yes Is the patient currently on ACE/ARB medication? No Does the patient have >5 day gap between last estimated fill dates? No  Star Rating Drugs:  Glipizide 5 mg Last filled:02/25/22 90 DS, 06/01/22 90 DS Rosuvastatin 40 mg Last filled:04/04/22 90 DS, 07/04/22 90 DS Evaristo Bury flextouch 100 unit Last filled:01/13/22 75 DS, 06/02/22 75 DS   Care Gaps: Annual wellness visit in last year? Yes Last eye exam / retinopathy screening:11/09/21 Last diabetic foot exam:09/09/22   Rance Muir, RMA

## 2022-07-12 NOTE — Progress Notes (Signed)
Safety precautions to be maintained throughout the outpatient stay will include: orient to surroundings, keep bed in low position, maintain call bell within reach at all times, provide assistance with transfer out of bed and ambulation.  

## 2022-07-18 DIAGNOSIS — H35371 Puckering of macula, right eye: Secondary | ICD-10-CM | POA: Diagnosis not present

## 2022-07-18 DIAGNOSIS — H04123 Dry eye syndrome of bilateral lacrimal glands: Secondary | ICD-10-CM | POA: Diagnosis not present

## 2022-07-18 DIAGNOSIS — H18413 Arcus senilis, bilateral: Secondary | ICD-10-CM | POA: Diagnosis not present

## 2022-07-18 DIAGNOSIS — Z961 Presence of intraocular lens: Secondary | ICD-10-CM | POA: Diagnosis not present

## 2022-07-18 DIAGNOSIS — H43821 Vitreomacular adhesion, right eye: Secondary | ICD-10-CM | POA: Diagnosis not present

## 2022-07-19 ENCOUNTER — Encounter (INDEPENDENT_AMBULATORY_CARE_PROVIDER_SITE_OTHER): Payer: Medicare Other | Admitting: Ophthalmology

## 2022-07-19 DIAGNOSIS — H35371 Puckering of macula, right eye: Secondary | ICD-10-CM | POA: Diagnosis not present

## 2022-07-19 DIAGNOSIS — H43813 Vitreous degeneration, bilateral: Secondary | ICD-10-CM | POA: Diagnosis not present

## 2022-07-19 DIAGNOSIS — Z96651 Presence of right artificial knee joint: Secondary | ICD-10-CM | POA: Diagnosis not present

## 2022-07-20 ENCOUNTER — Ambulatory Visit: Payer: Medicare Other | Admitting: Family Medicine

## 2022-07-24 DIAGNOSIS — N3020 Other chronic cystitis without hematuria: Secondary | ICD-10-CM | POA: Diagnosis not present

## 2022-07-24 DIAGNOSIS — N261 Atrophy of kidney (terminal): Secondary | ICD-10-CM | POA: Diagnosis not present

## 2022-07-25 ENCOUNTER — Ambulatory Visit: Payer: Medicare Other | Admitting: Family Medicine

## 2022-07-27 ENCOUNTER — Other Ambulatory Visit: Payer: Self-pay | Admitting: Family Medicine

## 2022-07-27 NOTE — Telephone Encounter (Signed)
Requested Prescriptions  Pending Prescriptions Disp Refills   rosuvastatin (CRESTOR) 40 MG tablet [Pharmacy Med Name: ROSUVASTATIN CALCIUM 40 MG TAB] 90 tablet 0    Sig: TAKE 1 TABLET BY MOUTH DAILY     Cardiovascular:  Antilipid - Statins 2 Failed - 07/27/2022 12:44 PM      Failed - Cr in normal range and within 360 days    Creatinine  Date Value Ref Range Status  09/20/2011 1.31 (H) 0.60 - 1.30 mg/dL Final   Creatinine, Ser  Date Value Ref Range Status  07/10/2022 1.62 (H) 0.57 - 1.00 mg/dL Final         Failed - Lipid Panel in normal range within the last 12 months    Cholesterol, Total  Date Value Ref Range Status  03/16/2022 136 100 - 199 mg/dL Final   Cholesterol Piccolo, Waived  Date Value Ref Range Status  01/31/2016 152 <200 mg/dL Final    Comment:                            Desirable                <200                         Borderline High      200- 239                         High                     >239    LDL Chol Calc (NIH)  Date Value Ref Range Status  03/16/2022 61 0 - 99 mg/dL Final   HDL  Date Value Ref Range Status  03/16/2022 46 >39 mg/dL Final   Triglycerides  Date Value Ref Range Status  03/16/2022 175 (H) 0 - 149 mg/dL Final   Triglycerides Piccolo,Waived  Date Value Ref Range Status  01/31/2016 224 (H) <150 mg/dL Final    Comment:                            Normal                   <150                         Borderline High     150 - 199                         High                200 - 499                         Very High                >499          Passed - Patient is not pregnant      Passed - Valid encounter within last 12 months    Recent Outpatient Visits           1 month ago Type 2 diabetes mellitus with diabetic peripheral angiopathy without gangrene, without long-term current use of insulin [facility])   [place] [facility] [place], [place],  DO   2 months ago Bartholin gland cyst   [place] [facility] [place], [place] P, DO   3 months ago Uncontrolled type 2 diabetes mellitus with hyperglycemia, with long-term current use of insulin (HCC)   [place] [facility] [place], Megan P, DO   3 months ago Uncontrolled type 2 diabetes mellitus with hyperglycemia, with long-term current use of insulin [facility])   [place] [facility] [place], Megan P, DO   4 months ago Influenza A with pneumonia   [place] [facility] [place] Dorcas [place], DO       Future Appointments             In 1 week Laural Benes, Oralia Rud, DO [place] [facility]

## 2022-08-01 ENCOUNTER — Inpatient Hospital Stay: Payer: Medicare Other | Attending: Internal Medicine | Admitting: Internal Medicine

## 2022-08-01 ENCOUNTER — Inpatient Hospital Stay: Payer: Medicare Other

## 2022-08-01 ENCOUNTER — Ambulatory Visit
Admission: RE | Admit: 2022-08-01 | Discharge: 2022-08-01 | Disposition: A | Discharge status: Home / Self Care | Payer: Medicare Other | Source: Ambulatory Visit | Attending: General Surgery | Admitting: General Surgery

## 2022-08-01 ENCOUNTER — Encounter: Payer: Self-pay | Admitting: Internal Medicine

## 2022-08-01 VITALS — BP 114/72 | HR 76 | Temp 98.0°F | Resp 18 | Wt 192.3 lb

## 2022-08-01 DIAGNOSIS — Z79811 Long term (current) use of aromatase inhibitors: Secondary | ICD-10-CM | POA: Insufficient documentation

## 2022-08-01 DIAGNOSIS — Z170 Estrogen receptor positive status [ER+]: Secondary | ICD-10-CM | POA: Insufficient documentation

## 2022-08-01 DIAGNOSIS — C50312 Malignant neoplasm of lower-inner quadrant of left female breast: Secondary | ICD-10-CM | POA: Insufficient documentation

## 2022-08-01 DIAGNOSIS — N951 Menopausal and female climacteric states: Secondary | ICD-10-CM | POA: Insufficient documentation

## 2022-08-01 DIAGNOSIS — N183 Chronic kidney disease, stage 3 unspecified: Secondary | ICD-10-CM | POA: Insufficient documentation

## 2022-08-01 DIAGNOSIS — C3412 Malignant neoplasm of upper lobe, left bronchus or lung: Secondary | ICD-10-CM

## 2022-08-01 DIAGNOSIS — N390 Urinary tract infection, site not specified: Secondary | ICD-10-CM | POA: Diagnosis not present

## 2022-08-01 DIAGNOSIS — M8580 Other specified disorders of bone density and structure, unspecified site: Secondary | ICD-10-CM | POA: Diagnosis not present

## 2022-08-01 DIAGNOSIS — M549 Dorsalgia, unspecified: Secondary | ICD-10-CM | POA: Diagnosis not present

## 2022-08-01 DIAGNOSIS — Z87891 Personal history of nicotine dependence: Secondary | ICD-10-CM | POA: Insufficient documentation

## 2022-08-01 DIAGNOSIS — E1122 Type 2 diabetes mellitus with diabetic chronic kidney disease: Secondary | ICD-10-CM | POA: Diagnosis not present

## 2022-08-01 DIAGNOSIS — M19012 Primary osteoarthritis, left shoulder: Secondary | ICD-10-CM | POA: Diagnosis not present

## 2022-08-01 DIAGNOSIS — Z780 Asymptomatic menopausal state: Secondary | ICD-10-CM | POA: Diagnosis not present

## 2022-08-01 DIAGNOSIS — J439 Emphysema, unspecified: Secondary | ICD-10-CM | POA: Insufficient documentation

## 2022-08-01 DIAGNOSIS — G8929 Other chronic pain: Secondary | ICD-10-CM | POA: Diagnosis not present

## 2022-08-01 DIAGNOSIS — M4802 Spinal stenosis, cervical region: Secondary | ICD-10-CM | POA: Diagnosis not present

## 2022-08-01 DIAGNOSIS — I700 Atherosclerosis of aorta: Secondary | ICD-10-CM | POA: Diagnosis not present

## 2022-08-01 DIAGNOSIS — T386X5A Adverse effect of antigonadotrophins, antiestrogens, antiandrogens, not elsewhere classified, initial encounter: Secondary | ICD-10-CM | POA: Diagnosis not present

## 2022-08-01 DIAGNOSIS — Z9012 Acquired absence of left breast and nipple: Secondary | ICD-10-CM | POA: Insufficient documentation

## 2022-08-01 DIAGNOSIS — X58XXXA Exposure to other specified factors, initial encounter: Secondary | ICD-10-CM | POA: Insufficient documentation

## 2022-08-01 DIAGNOSIS — Z801 Family history of malignant neoplasm of trachea, bronchus and lung: Secondary | ICD-10-CM | POA: Insufficient documentation

## 2022-08-01 DIAGNOSIS — I129 Hypertensive chronic kidney disease with stage 1 through stage 4 chronic kidney disease, or unspecified chronic kidney disease: Secondary | ICD-10-CM | POA: Diagnosis not present

## 2022-08-01 DIAGNOSIS — S2232XA Fracture of one rib, left side, initial encounter for closed fracture: Secondary | ICD-10-CM | POA: Diagnosis not present

## 2022-08-01 DIAGNOSIS — Z1231 Encounter for screening mammogram for malignant neoplasm of breast: Secondary | ICD-10-CM | POA: Insufficient documentation

## 2022-08-01 DIAGNOSIS — Z800 Family history of malignant neoplasm of digestive organs: Secondary | ICD-10-CM | POA: Diagnosis not present

## 2022-08-01 DIAGNOSIS — M818 Other osteoporosis without current pathological fracture: Secondary | ICD-10-CM | POA: Diagnosis not present

## 2022-08-01 LAB — BASIC METABOLIC PANEL
Anion gap: 12 (ref 5–15)
BUN: 31 mg/dL — ABNORMAL HIGH (ref 8–23)
CO2: 20 mmol/L — ABNORMAL LOW (ref 22–32)
Calcium: 9.2 mg/dL (ref 8.9–10.3)
Chloride: 103 mmol/L (ref 98–111)
Creatinine, Ser: 1.46 mg/dL — ABNORMAL HIGH (ref 0.44–1.00)
GFR, Estimated: 37 mL/min — ABNORMAL LOW (ref >60)
Glucose, Bld: 88 mg/dL (ref 70–99)
Potassium: 4.6 mmol/L (ref 3.5–5.1)
Sodium: 135 mmol/L (ref 135–145)

## 2022-08-01 LAB — CBC WITH DIFFERENTIAL/PLATELET
Abs Immature Granulocytes: 0.07 10*3/uL (ref 0.00–0.07)
Basophils Absolute: 0.1 10*3/uL (ref 0.0–0.1)
Basophils Relative: 1 %
Eosinophils Absolute: 0.2 10*3/uL (ref 0.0–0.5)
Eosinophils Relative: 2 %
HCT: 46.1 % — ABNORMAL HIGH (ref 36.0–46.0)
Hemoglobin: 14.1 g/dL (ref 12.0–15.0)
Immature Granulocytes: 1 %
Lymphocytes Relative: 25 %
Lymphs Abs: 2.3 10*3/uL (ref 0.7–4.0)
MCH: 27.6 pg (ref 26.0–34.0)
MCHC: 30.6 g/dL (ref 30.0–36.0)
MCV: 90.4 fL (ref 80.0–100.0)
Monocytes Absolute: 1 10*3/uL (ref 0.1–1.0)
Monocytes Relative: 11 %
Neutro Abs: 5.6 10*3/uL (ref 1.7–7.7)
Neutrophils Relative %: 60 %
Platelets: 282 10*3/uL (ref 150–400)
RBC: 5.1 MIL/uL (ref 3.87–5.11)
RDW: 14.4 % (ref 11.5–15.5)
WBC: 9.3 10*3/uL (ref 4.0–10.5)
nRBC: 0 % (ref 0.0–0.2)

## 2022-08-01 NOTE — Progress Notes (Signed)
[facility] CONSULT NOTE  Patient Care Team: Dorcas Carrow, DO as PCP - General (Family Medicine) Mariah Milling, [name], MD as PCP - Cardiology (Cardiology) Antonieta Iba, MD as Consulting Physician (Cardiology) Riki Altes, MD as Consulting Physician (Urology) Lamont Dowdy, MD as Consulting Physician (Nephrology) [name], MD as Referring Physician (Orthopedic Surgery) Alan Mulder, MD as Attending Physician (Endocrinology) Annice Needy, MD as Referring Physician (Vascular Surgery) Marlowe Sax, RN as Case Manager (General Practice) Zettie Pho, [facility] (Pharmacist)  CHIEF COMPLAINTS/PURPOSE OF CONSULTATION: Breast cancer  #  Oncology History Overview Note   # July 2020 Left Breast- IMC; 14 mm; Grade: 2; Lymphovascular Invasion: Not identified;  pT1c pN0(sn); ER positive, PR positive, HER2 negative. [s/p lumpec; Dr.Byrnett]; rs/p RT [finished Sep, 9th 2020]  # Sep end 2020- START Arimidexa  # JAN 2022- 38mm/left upper lobe lung nodule; PET positive; right upper lobe pneumonia/suspected reactive adenopathy. STAGE I Lung ca [suspected based on imaging no biopsy]- SBRT Dr.Crystal [May 2022]  # CKD-III/COPD/ CHF-compesated/PAD; CAD  DIAGNOSIS: Left breast cancer  STAGE: 1  ;GOALS: Cure  CURRENT/MOST RECENT THERAPY: arimidex   Malignant neoplasm of lower-inner quadrant of left female breast (HCC) (Resolved)  08/14/2018 Initial Diagnosis   Malignant neoplasm of lower-inner quadrant of left female breast [facility])   Carcinoma of lower-inner quadrant of left breast in female, estrogen receptor positive (HCC)  10/02/2018 Initial Diagnosis   Carcinoma of lower-inner quadrant of left breast in female, estrogen receptor positive (HCC)    HISTORY OF PRESENTING ILLNESS:In a motorized scooter.  Accompanied by her husband.  Tammy [name] 77 y.o.  female to history of stage I ER PR positive/HER-2 negative breast cancer ; and also stage I presumed left upper  lobe lung cancer [NO bX]-s/p SBRT is here for follow-up/review results of the CT scan.  Pt taking her arimidex. No side effects right now. Appetite is okay.   Patient has chronic tenderness of the left breast. Hx rib fractures.   Patient is currently being treated for UTI right now.  Chronic back pain is chronic fatigue.  Denies any new lumps or bumps.   Review of Systems  Constitutional:  Negative for chills, diaphoresis, fever, malaise/fatigue and weight loss.  HENT:  Negative for nosebleeds and sore throat.   Eyes:  Negative for double vision.  Respiratory:  Negative for cough, hemoptysis, sputum production, shortness of breath and wheezing.   Cardiovascular:  Negative for chest pain, palpitations, orthopnea and leg swelling.  Gastrointestinal:  Negative for abdominal pain, blood in stool, constipation, diarrhea, heartburn, melena, nausea and vomiting.  Genitourinary:  Negative for dysuria, frequency and urgency.  Musculoskeletal:  Positive for back pain and joint pain.  Skin: Negative.  Negative for itching and rash.  Neurological:  Negative for dizziness, tingling, focal weakness, weakness and headaches.  Endo/Heme/Allergies:  Does not bruise/bleed easily.  Psychiatric/Behavioral:  Negative for depression. The patient is not nervous/anxious and does not have insomnia.      MEDICAL HISTORY:  Past Medical History:  Diagnosis Date   Asthma    Benign neoplasm of colon    Breast cancer (HCC) 08/2018   left breast   Cervical disc disease    "bulging" - no limitations per pt   Chronic diastolic CHF (congestive heart failure) (HCC)    a. 07/2012 Echo: Nl EF. mild inferoseptal HK, Gr 2 DD, mild conc LVH, mild PR/TR, mild PAH; b. 08/2019 Echo: EF 55-60%, no rwma, Gr1 DD. Nl  RV size/fxn.   CKD (chronic kidney disease), stage III (HCC)    COPD (chronic obstructive pulmonary disease) (HCC)    Coronary artery disease    a. 11/2011 Cath: LAD 54m/d, LCX min irregs, RCA 70p, 118m with L->R  collats, EF 60%-->Med Rx; b. 03/2019 Cath: LM nl, LAD 40-41m. LCX large, mild diff dzs. OM1/2 nl. RCA known to be 129m. L-->R collats from LAD & LCX/OM.   Diabetes mellitus without complication (HCC)    Diagnosis unknown 05/07/2022   Fusion of lumbar spine 12/22/2015   GERD (gastroesophageal reflux disease)    History of DVT (deep vein thrombosis)    History of tobacco abuse    a. Quit 2011.   Hyperlipidemia    Hypertension    Hypothyroidism    Lung cancer (HCC)    Malignant neoplasm of unspecified site of right female breast (HCC) 03/14/1999   Obesity    Palpitations    Personal history of radiation therapy    PONV (postoperative nausea and vomiting)    Pulmonary embolus (HCC) 12/25/2013   RLL. Presented with SOB and elevated d-dimer.    PVD (peripheral vascular disease) (HCC)    a. PTA of right leg and left femoral artery with stenosis; b. 09/2020 ABI: R 0.93, L 0.88 - unchanged from prior study.   Stroke (HCC) 2015   TIAs - no deficits    SURGICAL HISTORY: Past Surgical History:  Procedure Laterality Date   ANGIOPLASTY / STENTING FEMORAL     PVD; angioplasty right leg and left femoral artery with stenosis.    APPENDECTOMY  12/2014   BACK SURGERY  03/2012   nerve stimulator inserted   BACK SURGERY     screws, rods replaced with new hardware.   BACK SURGERY  11/2016   BREAST LUMPECTOMY Left 08/26/2018   BREAST SURGERY     CARDIAC CATHETERIZATION  12/07/2011   Mid LAD 50%, distal LAD 50%, mid RCA 70%, distal RCA 100%    CARDIAC CATHETERIZATION  01/04/2011   100% occluded mid RCA with good collaterals from distal LAD, normal LVEF.   CATARACT EXTRACTION Right 12/21/2021   CHOLECYSTECTOMY     COLONOSCOPY  2013   COLONOSCOPY WITH PROPOFOL N/A 05/24/2015   Procedure: COLONOSCOPY WITH PROPOFOL;  Surgeon: Midge Minium, MD;  Location: [facility];  Service: Endoscopy;  Laterality: N/A;  Diabetic - oral meds   COLONOSCOPY WITH PROPOFOL N/A 01/28/2019   Procedure:  COLONOSCOPY WITH PROPOFOL;  Surgeon: Pasty Spillers, MD;  Location: [facility];  Service: Endoscopy;  Laterality: N/A;   DIAGNOSTIC LAPAROSCOPY     ESOPHAGOGASTRODUODENOSCOPY (EGD) WITH PROPOFOL N/A 01/28/2019   Procedure: ESOPHAGOGASTRODUODENOSCOPY (EGD) WITH PROPOFOL;  Surgeon: Pasty Spillers, MD;  Location: [facility] ENDOSCOPY;  Service: Endoscopy;  Laterality: N/A;   LAPAROSCOPIC APPENDECTOMY N/A 01/06/2015   Procedure: APPENDECTOMY LAPAROSCOPIC;  Surgeon: Kieth Brightly, MD;  Location: [facility];  Service: General;  Laterality: N/A;   LEFT HEART CATH AND CORONARY ANGIOGRAPHY Left 04/04/2019   Procedure: LEFT HEART CATH AND CORONARY ANGIOGRAPHY;  Surgeon: Antonieta Iba, MD;  Location: [facility];  Service: Cardiovascular;  Laterality: Left;   MASTECTOMY W/ SENTINEL NODE BIOPSY Left 08/26/2018   Procedure: PARTIAL MASTECTOMY WIDE EXCISION WITH SENTINEL LYMPH NODE BIOPSY LEFT;  Surgeon: Earline Mayotte, MD;  Location: [facility];  Service: General;  Laterality: Left;   POLYPECTOMY  05/24/2015   Procedure: POLYPECTOMY;  Surgeon: Midge Minium, MD;  Location: [facility];  Service: Endoscopy;;  REPLACEMENT TOTAL KNEE Right    RIGHT/LEFT HEART CATH AND CORONARY ANGIOGRAPHY Bilateral 09/26/2021   Procedure: RIGHT/LEFT HEART CATH AND CORONARY ANGIOGRAPHY;  Surgeon: Iran Ouch, MD;  Location: [facility];  Service: Cardiovascular;  Laterality: Bilateral;   THYROIDECTOMY     TOTAL KNEE ARTHROPLASTY Right 11/13/2019   TRIGGER FINGER RELEASE      SOCIAL HISTORY: Social History   Socioeconomic History   Marital status: Married    Spouse name: Not on file   Number of children: 2   Years of education: some college, Financial trader for interior design    Highest education level: Associate degree: occupational, Scientist, product/process development, or vocational program  Occupational History   Occupation: Retired  Tobacco Use   Smoking status: Former    Packs/day:  0.50    Years: 15.00    Additional pack years: 0.00    Total pack years: 7.50    Types: Cigarettes    Quit date: 03/14/2007    Years since quitting: 15.3   Smokeless tobacco: Never  Vaping Use   Vaping Use: Never used  Substance and Sexual Activity   Alcohol use: No    Alcohol/week: 0.0 standard drinks of alcohol   Drug use: Never   Sexual activity: Not Currently    Birth control/protection: Post-menopausal  Other Topics Concern   Not on file  Social History Narrative   Lives at home with husband in [place]. Quit smoking 10 years ago; never alcohol. 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    Social Determinants of Health   Financial Resource Strain: Low Risk  (05/08/2022)   Overall Financial Resource Strain (CARDIA)    Difficulty of Paying Living Expenses: Not hard at all  Food Insecurity: No Food Insecurity (03/14/2022)   Hunger Vital Sign    Worried About Running Out of Food in the Last Year: Never true    Ran Out of Food in the Last Year: Never true  Transportation Needs: No Transportation Needs (05/08/2022)   PRAPARE - Administrator, Civil Service (Medical): No    Lack of Transportation (Non-Medical): No  Physical Activity: Inactive (12/26/2021)   Exercise Vital Sign    Days of Exercise per Week: 0 days    Minutes of Exercise per Session: 0 min  Stress: No Stress Concern Present (12/26/2021)   [employer] of Occupational Health - Occupational Stress Questionnaire    Feeling of Stress : Not at all  Social Connections: Socially Integrated (12/28/2020)   Social Connection and Isolation Panel [NHANES]    Frequency of Communication with Friends and Family: More than three times a week    Frequency of Social Gatherings with Friends and Family: More than three times a week    Attends Religious Services: More than 4 times per year    Active Member of [employer] or Organizations: Yes    Attends Banker Meetings: 1  to 4 times per year    Marital Status: Married  Catering manager Violence: Not At Risk (12/26/2021)   Humiliation, Afraid, Rape, and Kick questionnaire    Fear of Current or Ex-Partner: No    Emotionally Abused: No    Physically Abused: No    Sexually Abused: No    FAMILY HISTORY: Family History  Problem Relation Age of Onset   Cancer Mother        colon   Hypertension Father    Heart disease Father  Heart attack Father    Diabetes Father    Cancer Sister        lung   COPD Sister    COPD Sister    Heart attack Brother    Diabetes Brother    Hypertension Brother    Heart disease Brother    Heart attack Brother    Diabetes Brother    Hypertension Brother    Heart disease Brother    Heart attack Brother    Diabetes Brother    Hypertension Brother    Heart disease Brother    Colon cancer Brother 71   Lung cancer Brother    Breast cancer Neg Hx     ALLERGIES:  is allergic to hydrocodone, aleve [naproxen sodium], contrast media [iodinated contrast media], ioxaglate, oxycodone, ranexa [ranolazine], sulfa antibiotics, and tramadol.  MEDICATIONS:  Current Outpatient Medications  Medication Sig Dispense Refill   acetaminophen (TYLENOL) 500 MG tablet Take 500 mg by mouth every 6 (six) hours as needed for moderate pain. Takes up to 3 tablets/day PRN for increased pain     albuterol (VENTOLIN HFA) 108 (90 Base) MCG/ACT inhaler Inhale 2 puffs into the lungs every 6 (six) hours as needed for wheezing or shortness of breath. 18 g 3   anastrozole (ARIMIDEX) 1 MG tablet TAKE 1 TABLET BY MOUTH DAILY 90 tablet 0   apixaban (ELIQUIS) 2.5 MG TABS tablet Take 1 tablet (2.5 mg total) by mouth 2 (two) times daily. 180 tablet 1   Calcium Carb-Cholecalciferol (CALCIUM 600 + D) 600-200 MG-UNIT TABS Take 1 tablet by mouth daily.      cholecalciferol (VITAMIN D3) 25 MCG (1000 UT) tablet Take 1,000 Units by mouth daily.     CRANBERRY PO Take 2 capsules by mouth daily.     DULoxetine  (CYMBALTA) 60 MG capsule Take 2 capsules (120 mg total) by mouth daily. 180 capsule 1   gabapentin (NEURONTIN) 300 MG capsule Take 2 capsules (600 mg total) by mouth 3 (three) times daily. (Patient taking differently: Take 600 mg by mouth 3 (three) times daily. Takes 2 tablets in the morning and 3 tablets at night) 540 capsule 1   glipiZIDE (GLUCOTROL) 5 MG tablet TAKE 1 TABLET BY MOUTH DAILY BEFORE BREAKFAST 90 tablet 1   isosorbide mononitrate (IMDUR) 60 MG 24 hr tablet TAKE ONE TABLET BY MOUTH AT BEDTIME 90 tablet 1   metoprolol tartrate (LOPRESSOR) 25 MG tablet Take 1 tablet (25 mg total) by mouth 2 (two) times daily. 180 tablet 1   montelukast (SINGULAIR) 10 MG tablet Take 1 tablet (10 mg total) by mouth at bedtime. 90 tablet 1   MYRBETRIQ 50 MG TB24 tablet TAKE 1 TABLET BY MOUTH DAILY 30 tablet 2   pantoprazole (PROTONIX) 40 MG tablet TAKE 1 TABLET BY MOUTH DAILY 90 tablet 1   Pumpkin Seed-Soy Germ (AZO BLADDER CONTROL/GO-LESS PO) Take 1 tablet by mouth in the morning and at bedtime.     rosuvastatin (CRESTOR) 40 MG tablet TAKE 1 TABLET BY MOUTH DAILY 90 tablet 0   SYNTHROID 150 MCG tablet Take 1 tablet (150 mcg total) by mouth daily before breakfast. 90 tablet 3   tirzepatide (MOUNJARO) 12.5 MG/0.5ML Pen Inject 12.5 mg into the skin once a week. 6 mL 1   TRESIBA FLEXTOUCH 100 UNIT/ML FlexTouch Pen Inject into the skin.     trimethoprim (TRIMPEX) 100 MG tablet TAKE ONE TABLET BY MOUTH EVERY DAY 90 tablet 1   UNABLE TO FIND Med Name: Energy, memory and  mood enhancer     ZINC CITRATE PO Take 1 tablet by mouth daily.     albuterol (PROVENTIL) (2.5 MG/3ML) 0.083% nebulizer solution Take 3 mLs (2.5 mg total) by nebulization every 6 (six) hours as needed for wheezing or shortness of breath. (Patient not taking: Reported on 08/01/2022) 225 mL 12   fluticasone (FLONASE) 50 MCG/ACT nasal spray Place 1 spray into both nostrils daily. (Patient not taking: Reported on 08/01/2022) 16 g 2    Fluticasone-Umeclidin-Vilant (TRELEGY ELLIPTA) 100-62.5-25 MCG/ACT AEPB Inhale 1 puff into the lungs daily. 3 each 4   nitroGLYCERIN (NITROSTAT) 0.4 MG SL tablet Place 1 tablet (0.4 mg total) under the tongue every 5 (five) minutes as needed for chest pain. Total of 3 doses (Patient not taking: Reported on 08/01/2022) 25 tablet 6   ondansetron (ZOFRAN) 4 MG tablet Take 1 tablet (4 mg total) by mouth every 6 (six) hours as needed for nausea. (Patient not taking: Reported on 08/01/2022) 20 tablet 0   No current facility-administered medications for this visit.      [name]  PHYSICAL EXAMINATION: ECOG PERFORMANCE STATUS: 1 - Symptomatic but completely ambulatory  Vitals:   08/01/22 1557  BP: 114/72  Pulse: 76  Resp: 18  Temp: 98 F (36.7 C)  SpO2: 97%    Filed Weights   08/01/22 1557  Weight: 192 lb 4.8 oz (87.2 kg)     Physical Exam Constitutional:      Comments: Patient is ambulating with a cane.  She is accompanied by her husband.  HENT:     Head: Normocephalic and atraumatic.     Mouth/Throat:     Pharynx: No oropharyngeal exudate.  Eyes:     Pupils: Pupils are equal, round, and reactive to light.  Cardiovascular:     Rate and Rhythm: Normal rate and regular rhythm.  Pulmonary:     Effort: Pulmonary effort is normal. No respiratory distress.     Breath sounds: Normal breath sounds. No wheezing.  Abdominal:     General: Bowel sounds are normal. There is no distension.     Palpations: Abdomen is soft. There is no mass.     Tenderness: There is no abdominal tenderness. There is no guarding or rebound.  Musculoskeletal:        General: No tenderness. Normal range of motion.     Cervical back: Normal range of motion and neck supple.  Skin:    General: Skin is warm.     Comments: Right and left BREAST exam [in the presence of nurse]- no unusual skin changes or dominant masses felt. Surgical scars noted.    Neurological:     Mental Status: She is alert and oriented to person,  place, and time.  Psychiatric:        Mood and Affect: Affect normal.    LABORATORY DATA:  I have reviewed the data as listed Lab Results  Component Value Date   WBC 9.3 08/01/2022   HGB 14.1 08/01/2022   HCT 46.1 (H) 08/01/2022   MCV 90.4 08/01/2022   PLT 282 08/01/2022   Recent Labs    03/08/22 1045 03/09/22 1039 03/10/22 0930 03/16/22 1327 06/28/22 1157 07/10/22 1020 08/01/22 1532  NA  --  141 141 142 141 141 135  K  --  4.3 4.1 4.7 5.5* 4.9 4.6  CL  --  112* 111 102 107* 104 103  CO2  --  22 21* 25  --  20 20*  GLUCOSE  --  149*  69* 85 126* 123* 88  BUN  --  21 16 20 25 25  31*  CREATININE  --  1.24* 1.15* 1.25* 1.83* 1.62* 1.46*  CALCIUM  --  7.4* 7.3* 9.2 9.7 9.3 9.2  GFRNONAA  --  45* 49*  --   --   --  37*  PROT 5.7* 5.7* 5.6* 6.4 6.6  --   --   ALBUMIN 2.3* 2.3* 2.4* 3.8 4.0  --   --   AST 22 27 26 29 18   --   --   ALT 15 17 14 22   --   --   --   ALKPHOS 61 67 61 84 91  --   --   BILITOT 0.4 0.4 0.4 0.3 0.3  --   --   BILIDIR <0.1  --   --   --   --   --   --   IBILI NOT CALCULATED  --   --   --   --   --   --     RADIOGRAPHIC STUDIES: I have personally reviewed the radiological images as listed and agreed with the findings in the report. CT CHEST WO CONTRAST  Result Date: 07/16/2022 CLINICAL DATA:  Left upper lobe lung cancer, status post radiation. Left breast cancer, status post lumpectomy. History of left rib fractures, with continued pain. EXAM: CT CHEST WITHOUT CONTRAST TECHNIQUE: Multidetector CT imaging of the chest was performed following the standard protocol without IV contrast. RADIATION DOSE REDUCTION: This exam was performed according to the departmental dose-optimization program which includes automated exposure control, adjustment of the mA and/or kV according to patient size and/or use of iterative reconstruction technique. COMPARISON:  CT chest dated 05/02/2022. FINDINGS: Cardiovascular: The heart is normal in size. No pericardial effusion. No  evidence of thoracic aortic aneurysm. Atherosclerotic calcifications of the aortic arch. Moderate three-vessel coronary atherosclerosis. Mediastinum/Nodes: Small mediastinal lymph nodes, including 8 mm short axis subcarinal node and a 10 mm short axis low right paratracheal node with preservation of the normal fatty hilum, unchanged and likely reactive. Lungs/Pleura: Biapical pleural-parenchymal scarring. Platelike scarring/radiation changes in the left upper lobe. Mild centrilobular and paraseptal emphysematous changes, upper lung predominant. 4 mm subpleural nodule along the right minor fissure (series 3/image 38), new/more conspicuous than on the prior, although likely benign. Additional 3 mm ground-glass subpleural nodule along the minor fissure (series 3/image 32), possibly new. 3 mm subpleural nodule in the right middle lobe (series 3/image 34), unchanged. Scarring/atelectasis in the posterior right middle lobe. Scarring/atelectasis the left lung base. No pleural effusion or pneumothorax. Upper Abdomen: Visualized upper abdomen is notable for vascular calcifications and prior cholecystectomy. Musculoskeletal: Thoracic spine stimulator. Lumbar spine fixation hardware, incompletely visualized. Nonunited left 3rd and 4th rib fractures, although with suspected interval healing/callus formation. IMPRESSION: Healing left 3rd and 4th rib fractures, as above. Small right lung nodules measuring up to 4 mm, likely benign. Attention on follow-up is suggested. Additional stable ancillary findings as above. Aortic Atherosclerosis (ICD10-I70.0) and Emphysema (ICD10-J43.9). Electronically Signed   By: Charline Bills M.D.   On: 07/16/2022 01:54   DG Lumbar Spine Complete W/Bend  Result Date: 07/08/2022 CLINICAL DATA:  Pain EXAM: LUMBAR SPINE - COMPLETE WITH BENDING VIEWS COMPARISON:  04/08/2019. FINDINGS: Alignment normal. Patient is status post posterior pedicle fusion and discectomies L2 through S1. No motion with  flexion and extension to suggest instability. No motion with flexion and extension to suggest instability. Thoracic spine stimulation wires noted. IMPRESSION: Postop changes L2-S1.  No acute osseous abnormalities identified. Electronically Signed   By: Layla Maw M.D.   On: 07/08/2022 16:19   DG Cervical Spine With Flex & Extend  Result Date: 07/08/2022 CLINICAL DATA:  Cervicalgia EXAM: CERVICAL SPINE COMPLETE WITH FLEXION AND EXTENSION 7 VIEWS COMPARISON:  09/30/2020. FINDINGS: No fracture, dislocation or subluxation. No spondylolisthesis. No osteolytic or osteoblastic changes. Prevertebral and cervical cranial soft tissues are unremarkable. No motion with flexion and extension to suggest instability. Degenerative disc disease noted with disc space narrowing and marginal osteophytes at C5-T1. IMPRESSION: Degenerative changes. No acute osseous abnormalities. Electronically Signed   By: Layla Maw M.D.   On: 07/08/2022 16:17     ASSESSMENT & PLAN:   Carcinoma of lower-inner quadrant of left breast in female, estrogen receptor positive (HCC) #Left upper lobe lung nodule 6 mm MAY 2021-slow-growing currently 9 mm May 2022 CT scan.  Difficulty biopsy-peripheral/subpleural.  S/p SBRT [June 2022];  MAY 2024- CT Chest- Small right lung nodules measuring up to 4 mm, likely benign. Would recommend CT scan in 12 months.     # Left breast stage I breast cancer ER PR positive HER-2 negative; status post lumpectomy.  On arimidex [until sep 2025- x 5 years]. Mammogram-May 2024 - left mammo-WNL [Dr.Cintron];stable. Tolerating well except for hot flashes see below  # UTI- on Abx per Urology [[place]]  # Hot flashes -grade 1- monitor for now. Stable.   # BMD- FEB 2021- T score;  - 1.5.on ca+vit D-stable.   # CKD- III- GFR-34 .[NOV 2021-]; stable- Dr.Kolluru/follow-up with PCP.  # Diabetes- [CGM] Poorly controlled- 88 -improved- recent Hb A1c-  6.5  [from 9.5] - stan;e.   #Incidental findings on Imaging   CT MAY  2024: rib fractures; emphysema; atherosclerosis I reviewed/discussed/counseled the patient.   # DISPOSITION:  #  Follow-up in 6 months MD;  labs-CBC BMP;vit D 25 OH levels- BMD prior- -Dr.B  # I reviewed the blood work- with the patient in detail; also reviewed the imaging independently [as summarized above]; and with the patient in detail.    # I reviewed the blood work- with the patient in detail; also reviewed the imaging independently [as summarized above]; and with the patient in detail.     All questions were answered. The patient/family knows to call the clinic with any problems, questions or concerns.       Earna Coder, MD 08/01/2022 4:20 PM

## 2022-08-01 NOTE — Assessment & Plan Note (Addendum)
#  Left upper lobe lung nodule 6 mm MAY 2021-slow-growing currently 9 mm May 2022 CT scan.  Difficulty biopsy-peripheral/subpleural.  S/p SBRT [June 2022];  MAY 2024- CT Chest- Small right lung nodules measuring up to 4 mm, likely benign. Would recommend CT scan in 12 months.     # Left breast stage I breast cancer ER PR positive HER-2 negative; status post lumpectomy.  On arimidex [until sep 2025- x 5 years]. Mammogram-May 2024 - left mammo-WNL [Dr.Cintron];stable. Tolerating well except for hot flashes see below  # UTI- on Abx per Urology [[place]]  # Hot flashes -grade 1- monitor for now. Stable.   # BMD- FEB 2021- T score;  - 1.5.on ca+vit D-stable.  Will order bone density test today.  # CKD- III- GFR-34 .[NOV 2021-]; stable- Dr.Kolluru/follow-up with PCP.  # Diabetes- [CGM] Poorly controlled- 88 -improved- recent Hb A1c-  6.5  [from 9.5] - stan;e.   #Incidental findings on Imaging  CT MAY  2024: rib fractures; emphysema; atherosclerosis I reviewed/discussed/counseled the patient.   # DISPOSITION:  #  Follow-up in 6 months MD;  labs-CBC BMP;vit D 25 OH levels- BMD prior- -Dr.B  # I reviewed the blood work- with the patient in detail; also reviewed the imaging independently [as summarized above]; and with the patient in detail.    # I reviewed the blood work- with the patient in detail; also reviewed the imaging independently [as summarized above]; and with the patient in detail.

## 2022-08-01 NOTE — Progress Notes (Signed)
Pt taking her arimidex. No side effects right now. Appetite is okay. Left breast stays tender. Hx rib fractures. Being treated for UTI right now.

## 2022-08-08 ENCOUNTER — Ambulatory Visit (INDEPENDENT_AMBULATORY_CARE_PROVIDER_SITE_OTHER): Payer: Medicare Other

## 2022-08-08 ENCOUNTER — Telehealth: Payer: Medicare Other

## 2022-08-08 ENCOUNTER — Encounter: Payer: Self-pay | Admitting: Family Medicine

## 2022-08-08 ENCOUNTER — Ambulatory Visit (INDEPENDENT_AMBULATORY_CARE_PROVIDER_SITE_OTHER): Payer: Medicare Other | Admitting: Family Medicine

## 2022-08-08 ENCOUNTER — Other Ambulatory Visit: Payer: Self-pay | Admitting: Family Medicine

## 2022-08-08 VITALS — BP 106/48 | HR 80 | Temp 98.2°F

## 2022-08-08 DIAGNOSIS — G8929 Other chronic pain: Secondary | ICD-10-CM | POA: Diagnosis not present

## 2022-08-08 DIAGNOSIS — E1151 Type 2 diabetes mellitus with diabetic peripheral angiopathy without gangrene: Secondary | ICD-10-CM

## 2022-08-08 DIAGNOSIS — I959 Hypotension, unspecified: Secondary | ICD-10-CM | POA: Diagnosis not present

## 2022-08-08 DIAGNOSIS — Z170 Estrogen receptor positive status [ER+]: Secondary | ICD-10-CM

## 2022-08-08 DIAGNOSIS — M549 Dorsalgia, unspecified: Secondary | ICD-10-CM | POA: Diagnosis not present

## 2022-08-08 DIAGNOSIS — I25118 Atherosclerotic heart disease of native coronary artery with other forms of angina pectoris: Secondary | ICD-10-CM

## 2022-08-08 NOTE — Chronic Care Management (AMB) (Signed)
Chronic Care Management   CCM RN Visit Note  08/08/2022 Name: Tammy [name] MRN: [id number] DOB: 18-Jun-1945  Subjective: Tammy [name] is a 77 y.o. year old female who is a primary care patient of Dorcas Carrow, DO. The patient was referred to the Chronic Care Management team for assistance with care management needs subsequent to provider initiation of CCM services and plan of care.    Today's Visit:  Engaged with patient by telephone for follow up visit.        Goals Addressed             This Visit's Progress    CCM (DIABETES) EXPECTED OUTCOME: MONITOR, SELF-MANAGE AND REDUCE SYMPTOMS OF DIABETES       Current Barriers:  Knowledge Deficits related to how to effectively manage and maintain DM and goal of A1C <8% Chronic Disease Management support and education needs related to effective management DM Goal set by the provider <8.1% Lab Results  Component Value Date   HGBA1C 6.5 (H) 06/20/2022     Planned Interventions: Provided education to patient about basic DM disease process. Education provided. The patient has a stable A1C. Is at goal and is happy that she is at goal. She feels great and it has been a long time since she felt this good. Education and support provided. ; Reviewed medications with patient and discussed importance of medication adherence. The patient is compliant with medications. The patient states she was able to get the [facility] from the outpatient pharmacy and is thankful for the resource. Has her medications now. Denies any acute changes at this time. Denies any new concerns related to medications. Continues to get her medications from the outpatient pharmacy at [facility] [address] Is happy they have her medications in stock.  Reviewed prescribed diet with patient heart healthy/ADA diet. The patient is doing well and says that she has lost weight. She is eating but not snacking like she was. She will drink boost every now and then to help with protein intake. Education and  support given.  Counseled on importance of regular laboratory monitoring as prescribed. Labs are up to date. Denies any acute findings. A1C is stable and trending down.;        Discussed plans with patient for ongoing care management follow up and provided patient with direct contact information for care management team;      Provided patient with written educational materials related to hypo and hyperglycemia and importance of correct treatment. Review of how to effectively manage drops in blood sugars or highs. The patient states the [facility] is really helpful and alerts her when her blood sugars is high or low. She states that her Freestyle alerted her this am that her blood sugars were low at 55, she did not have any sx or sx. The patient states that she ate some candy and it came up to 110 and then down 58. Education on monitoring for changes and sometimes the freestyle may be off some.       Reviewed scheduled/upcoming provider appointments including: 08-08-2022, will see the pcp this afternoon.         Advised patient, providing education and rationale, to check cbg when you have symptoms of low or high blood sugar and has freestyle libre  and record. States her blood sugars are really good. She says they are staying in range well. Will continue to monitor. States they have been up and down some but still in good range. call provider  for findings outside established parameters;       Review of patient status, including review of consultants reports, relevant laboratory and other test results, and medications completed;       Advised patient to discuss changes in DM and diabetes health with provider;      Screening for signs and symptoms of depression related to chronic disease state;        Assessed social determinant of health barriers;         Symptom Management: Take medications as prescribed   Attend all scheduled provider appointments Call pharmacy for medication refills 3-7 days in  advance of running out of medications Call provider office for new concerns or questions  call the Suicide and Crisis Lifeline: 988 call the [place] National Suicide Prevention Lifeline: 724-829-0282 or TTY: 908-501-5883 TTY (610) 153-8554) to talk to a trained counselor call 1-800-273-TALK (toll free, 24 hour hotline) if experiencing a Mental Health or Behavioral Health Crisis  check feet daily for cuts, sores or redness trim toenails straight across manage portion size wash and dry feet carefully every day wear comfortable, cotton socks wear comfortable, well-fitting shoes  Follow Up Plan: Telephone follow up appointment with care management team member scheduled for: 10-02-2022 at 1030 am       CCM Expected Outcome:  Monitor, Self-Manage and Reduce Symptoms of  breast cancer       Current Barriers:  Chronic Disease Management support and education needs related to effective management of breast cancer  Planned Interventions: Assessed patient understanding of cancer diagnosis and recommended treatment plan Reviewed upcoming provider appointments and treatment appointments. Has appointment with the pcp for follow up on 08-08-2022 at 2 pm Assessed available transportation to appointments and treatments. Has consistent/reliable transportation: Yes Assessed support system. Has consistent/reliable family or other support: Yes Nutrition assessment performed The patient saw the oncologist yesterday and current treatment plan with Arimidex is effective. The patient states there were  no changes in the plan of care for her breast cancer treatment plan. Follows up accordingly with the oncologist. Saw the specialist last week and got a good report. Denies any new concerns related to her history of breast cancer   Symptom Management: Take medications as prescribed   Attend all scheduled provider appointments Call pharmacy for medication refills 3-7 days in advance of running out of medications Call  provider office for new concerns or questions  call the Suicide and Crisis Lifeline: 988 call the [place] National Suicide Prevention Lifeline: (250) 540-4692 or TTY: 418-459-8304 TTY (929) 074-2343) to talk to a trained counselor call 1-800-273-TALK (toll free, 24 hour hotline) if experiencing a Mental Health or Behavioral Health Crisis   Follow Up Plan: Telephone follow up appointment with care management team member scheduled for: 10-02-2022 at 1030 am       CCM Expected Outcome:  Monitor, Self-Manage and Reduce Symptoms of CAD       Current Barriers:  Chronic Disease Management support and education needs related to effective management of CAD BP Readings from Last 3 Encounters:  08/01/22 114/72  07/12/22 (!) 101/46  06/28/22 103/60     Planned Interventions: Assessed understanding of CAD diagnosis. The patient states that she is much better and having a good day today. Saw the cardiologist recently and got a good report. Denies any new concerns with CAD or heart health. Blood pressures are more stable. Education and support given. The patient feels she is doing well and feels the best she has felt in a long time. Her  and her husband are going to the beach this week and then to see her granddaughter at [facility]. She is excited about her upcoming trip.  She denies any new concerns with her CAD or heart health Medications reviewed including medications utilized in CAD treatment plan. Is compliant with medications. Denies any new concerns with medications.  Provided education on importance of blood pressure control in management of CAD. Her blood pressures are stable. She denies any acute findings with changes in her blood pressures. ; Provided education on Importance of limiting foods high in cholesterol. Has lost weight, eats healthy, does not eat between meals.; Counseled on importance of regular laboratory monitoring as prescribed. The patient has regular lab work.; Reviewed Importance of  taking all medications as prescribed. Takes medications as prescribed.  Reviewed Importance of attending all scheduled provider appointments. Next appointment with pcp is this afternoon for follow up, reminder given today.  Advised to report any changes in symptoms or exercise tolerance Advised patient to discuss changes in CAD or heart health with provider; Screening for signs and symptoms of depression related to chronic disease state;  Assessed social determinant of health barriers;   Symptom Management: Take medications as prescribed   Attend all scheduled provider appointments Call provider office for new concerns or questions  call the Suicide and Crisis Lifeline: 988 call the [place] National Suicide Prevention Lifeline: 870-225-9753 or TTY: 657-509-6997 TTY (828)856-8887) to talk to a trained counselor call 1-800-273-TALK (toll free, 24 hour hotline) if experiencing a Mental Health or Behavioral Health Crisis   Follow Up Plan: Telephone follow up appointment with care management team member scheduled for: 10-02-2022 at 1030 am          Plan:Telephone follow up appointment with care management team member scheduled for:  10-02-2022 at 1030 am   Alto Denver RN, MSN, CCM RN Care Manager  Chronic Care Management Direct Number: [phone]

## 2022-08-08 NOTE — Progress Notes (Signed)
BP (!) 106/48   Pulse 80   Temp 98.2 F (36.8 C) (Oral)   SpO2 95%    Subjective:    Patient ID: Tammy [name], female    DOB: [birth year], 77 y.o.   MRN: [id number]  HPI: Tammy [name] is a 77 y.o. female  Chief Complaint  Patient presents with   Back Pain   Feeling more like her old self. She feels like she is doing more. Gabapentin has been doing better. Saw her orthopedist for her knee but thinks that she is going to follow up with her pain management for both her back and her knee.   BACK PAIN Duration: chronic Mechanism of injury:  fall Location: low back Severity: moderate Quality: aching, shootin, sharp Frequency: intermittent Radiation: into her buttock Aggravating factors: movement Alleviating factors: medicine, rest Status: better Treatments attempted: surgery, injections, pain medicine  Nighttime pain:  no Paresthesias / decreased sensation:  yes Bowel / bladder incontinence:  no Fevers:  no Dysuria / urinary frequency:  no   Relevant past medical, surgical, family and social history reviewed and updated as indicated. Interim medical history since our last visit reviewed. Allergies and medications reviewed and updated.  Review of Systems  Constitutional: Negative.   Respiratory: Negative.    Cardiovascular: Negative.   Gastrointestinal: Negative.   Musculoskeletal:  Positive for back pain and myalgias. Negative for arthralgias, gait problem, joint swelling, neck pain and neck stiffness.  Neurological: Negative.   Psychiatric/Behavioral: Negative.      Per HPI unless specifically indicated above     Objective:    BP (!) 106/48   Pulse 80   Temp 98.2 F (36.8 C) (Oral)   SpO2 95%   Wt Readings from Last 3 Encounters:  08/01/22 192 lb 4.8 oz (87.2 kg)  07/12/22 196 lb (88.9 kg)  06/28/22 196 lb (88.9 kg)    Physical Exam Vitals and nursing note reviewed.  Constitutional:      General: She is not in acute distress.    Appearance: Normal  appearance. She is not ill-appearing, toxic-appearing or diaphoretic.  HENT:     Head: Normocephalic and atraumatic.     Right Ear: External ear normal.     Left Ear: External ear normal.     [name]: [name] normal.     Mouth/Throat:     Mouth: Mucous membranes are moist.     Pharynx: Oropharynx is clear.  Eyes:     General: No scleral icterus.       Right eye: No discharge.        Left eye: No discharge.     Extraocular Movements: Extraocular movements intact.     Conjunctiva/sclera: Conjunctivae normal.     Pupils: Pupils are equal, round, and reactive to light.  Cardiovascular:     Rate and Rhythm: Normal rate and regular rhythm.     Pulses: Normal pulses.     Heart sounds: Normal heart sounds. No murmur heard.    No friction rub. No gallop.  Pulmonary:     Effort: Pulmonary effort is normal. No respiratory distress.     Breath sounds: Normal breath sounds. No stridor. No wheezing, rhonchi or rales.  Chest:     Chest wall: No tenderness.  Musculoskeletal:        General: Normal range of motion.     Cervical back: Normal range of motion and neck supple.  Skin:    General: Skin is warm and dry.  Capillary Refill: Capillary refill takes less than 2 seconds.     Coloration: Skin is not jaundiced or pale.     Findings: No bruising, erythema, lesion or rash.  Neurological:     General: No focal deficit present.     Mental Status: She is alert and oriented to person, place, and time. Mental status is at baseline.  Psychiatric:        Mood and Affect: Mood normal.        Behavior: Behavior normal.        Thought Content: Thought content normal.        Judgment: Judgment normal.     Results for orders placed or performed in visit on 08/01/22  Basic metabolic panel  Result Value Ref Range   Sodium 135 135 - 145 mmol/L   Potassium 4.6 3.5 - 5.1 mmol/L   Chloride 103 98 - 111 mmol/L   CO2 20 (L) 22 - 32 mmol/L   Glucose, Bld 88 70 - 99 mg/dL   BUN 31 (H) 8 - 23 mg/dL    Creatinine, Ser 4.09 (H) 0.44 - 1.00 mg/dL   Calcium 9.2 8.9 - 81.1 mg/dL   GFR, Estimated 37 (L) >60 mL/min   Anion gap 12 5 - 15  CBC with Differential/Platelet  Result Value Ref Range   WBC 9.3 4.0 - 10.5 K/uL   RBC 5.10 3.87 - 5.11 MIL/uL   Hemoglobin 14.1 12.0 - 15.0 g/dL   HCT 91.4 (H) 78.2 - 95.6 %   MCV 90.4 80.0 - 100.0 fL   MCH 27.6 26.0 - 34.0 pg   MCHC 30.6 30.0 - 36.0 g/dL   RDW 21.3 08.6 - 57.8 %   Platelets 282 150 - 400 K/uL   nRBC 0.0 0.0 - 0.2 %   Neutrophils Relative % 60 %   Neutro Abs 5.6 1.7 - 7.7 K/uL   Lymphocytes Relative 25 %   Lymphs Abs 2.3 0.7 - 4.0 K/uL   Monocytes Relative 11 %   Monocytes Absolute 1.0 0.1 - 1.0 K/uL   Eosinophils Relative 2 %   Eosinophils Absolute 0.2 0.0 - 0.5 K/uL   Basophils Relative 1 %   Basophils Absolute 0.1 0.0 - 0.1 K/uL   Immature Granulocytes 1 %   Abs Immature Granulocytes 0.07 0.00 - 0.07 K/uL   *Note: Due to a large number of results and/or encounters for the requested time period, some results have not been displayed. A complete set of results can be found in Results Review.      Assessment & Plan:   Problem List Items Addressed This Visit   None Visit Diagnoses     Chronic midline back pain, unspecified back location    -  Primary   Following with pain management. She is happy with them. Continue gabapentin. Call with any concerns.   Hypotension, unspecified hypotension type       Discussed increasing water intake. Keep an eye on it at home. Continue to monitor.        Follow up plan: Return in about 4 weeks (around 09/05/2022).

## 2022-08-08 NOTE — Patient Instructions (Signed)
Please call the care guide team at [phone] if you need to cancel or reschedule your appointment.   If you are experiencing a Mental Health or Behavioral Health Crisis or need someone to talk to, please call the Suicide and Crisis Lifeline: 988 call the [place] National Suicide Prevention Lifeline: 6153325062 or TTY: 563-156-9242 TTY (364)558-5204) to talk to a trained counselor call 1-800-273-TALK (toll free, 24 hour hotline)   Following is a copy of the CCM Program Consent:  CCM service includes personalized support from designated clinical staff supervised by the physician, including individualized plan of care and coordination with other care providers 24/7 contact phone numbers for assistance for urgent and routine care needs. Service will only be billed when office clinical staff spend 20 minutes or more in a month to coordinate care. Only one practitioner may furnish and bill the service in a calendar month. The patient may stop CCM services at amy time (effective at the end of the month) by phone call to the office staff. The patient will be responsible for cost sharing (co-pay) or up to 20% of the service fee (after annual deductible is met)  Following is a copy of your full provider care plan:   Goals Addressed             This Visit's Progress    CCM (DIABETES) EXPECTED OUTCOME: MONITOR, SELF-MANAGE AND REDUCE SYMPTOMS OF DIABETES       Current Barriers:  Knowledge Deficits related to how to effectively manage and maintain DM and goal of A1C <8% Chronic Disease Management support and education needs related to effective management DM Goal set by the provider <8.1% Lab Results  Component Value Date   HGBA1C 6.5 (H) 06/20/2022     Planned Interventions: Provided education to patient about basic DM disease process. Education provided. The patient has a stable A1C. Is at goal and is happy that she is at goal. She feels great and it has been a long time since she felt this  good. Education and support provided. ; Reviewed medications with patient and discussed importance of medication adherence. The patient is compliant with medications. The patient states she was able to get the [facility] from the outpatient pharmacy and is thankful for the resource. Has her medications now. Denies any acute changes at this time. Denies any new concerns related to medications. Continues to get her medications from the outpatient pharmacy at [facility]. Is happy they have her medications in stock.  Reviewed prescribed diet with patient heart healthy/ADA diet. The patient is doing well and says that she has lost weight. She is eating but not snacking like she was. She will drink boost every now and then to help with protein intake. Education and support given.  Counseled on importance of regular laboratory monitoring as prescribed. Labs are up to date. Denies any acute findings. A1C is stable and trending down.;        Discussed plans with patient for ongoing care management follow up and provided patient with direct contact information for care management team;      Provided patient with written educational materials related to hypo and hyperglycemia and importance of correct treatment. Review of how to effectively manage drops in blood sugars or highs. The patient states the [facility] is really helpful and alerts her when her blood sugars is high or low. She states that her Freestyle alerted her this am that her blood sugars were low at 55, she did not have any sx or  sx. The patient states that she ate some candy and it came up to 110 and then down 58. Education on monitoring for changes and sometimes the freestyle may be off some.       Reviewed scheduled/upcoming provider appointments including: 08-08-2022, will see the pcp this afternoon.         Advised patient, providing education and rationale, to check cbg when you have symptoms of low or high blood sugar and has freestyle libre  and  record. States her blood sugars are really good. She says they are staying in range well. Will continue to monitor. States they have been up and down some but still in good range. call provider for findings outside established parameters;       Review of patient status, including review of consultants reports, relevant laboratory and other test results, and medications completed;       Advised patient to discuss changes in DM and diabetes health with provider;      Screening for signs and symptoms of depression related to chronic disease state;        Assessed social determinant of health barriers;         Symptom Management: Take medications as prescribed   Attend all scheduled provider appointments Call pharmacy for medication refills 3-7 days in advance of running out of medications Call provider office for new concerns or questions  call the Suicide and Crisis Lifeline: 988 call the [place] National Suicide Prevention Lifeline: 510 491 7109 or TTY: (937)652-8422 TTY 424-712-6037) to talk to a trained counselor call 1-800-273-TALK (toll free, 24 hour hotline) if experiencing a Mental Health or Behavioral Health Crisis  check feet daily for cuts, sores or redness trim toenails straight across manage portion size wash and dry feet carefully every day wear comfortable, cotton socks wear comfortable, well-fitting shoes  Follow Up Plan: Telephone follow up appointment with care management team member scheduled for: 10-02-2022 at 1030 am       CCM Expected Outcome:  Monitor, Self-Manage and Reduce Symptoms of  breast cancer       Current Barriers:  Chronic Disease Management support and education needs related to effective management of breast cancer  Planned Interventions: Assessed patient understanding of cancer diagnosis and recommended treatment plan Reviewed upcoming provider appointments and treatment appointments. Has appointment with the pcp for follow up on 08-08-2022 at 2  pm Assessed available transportation to appointments and treatments. Has consistent/reliable transportation: Yes Assessed support system. Has consistent/reliable family or other support: Yes Nutrition assessment performed The patient saw the oncologist yesterday and current treatment plan with Arimidex is effective. The patient states there were  no changes in the plan of care for her breast cancer treatment plan. Follows up accordingly with the oncologist. Saw the specialist last week and got a good report. Denies any new concerns related to her history of breast cancer   Symptom Management: Take medications as prescribed   Attend all scheduled provider appointments Call pharmacy for medication refills 3-7 days in advance of running out of medications Call provider office for new concerns or questions  call the Suicide and Crisis Lifeline: 988 call the [place] National Suicide Prevention Lifeline: (732)472-8547 or TTY: (505)419-3372 TTY 719-577-1874) to talk to a trained counselor call 1-800-273-TALK (toll free, 24 hour hotline) if experiencing a Mental Health or Behavioral Health Crisis   Follow Up Plan: Telephone follow up appointment with care management team member scheduled for: 10-02-2022 at 1030 am       CCM Expected  Outcome:  Monitor, Self-Manage and Reduce Symptoms of CAD       Current Barriers:  Chronic Disease Management support and education needs related to effective management of CAD BP Readings from Last 3 Encounters:  08/01/22 114/72  07/12/22 (!) 101/46  06/28/22 103/60     Planned Interventions: Assessed understanding of CAD diagnosis. The patient states that she is much better and having a good day today. Saw the cardiologist recently and got a good report. Denies any new concerns with CAD or heart health. Blood pressures are more stable. Education and support given. The patient feels she is doing well and feels the best she has felt in a long time. Her and her husband  are going to the beach this week and then to see her granddaughter at [facility]. She is excited about her upcoming trip.  She denies any new concerns with her CAD or heart health Medications reviewed including medications utilized in CAD treatment plan. Is compliant with medications. Denies any new concerns with medications.  Provided education on importance of blood pressure control in management of CAD. Her blood pressures are stable. She denies any acute findings with changes in her blood pressures. ; Provided education on Importance of limiting foods high in cholesterol. Has lost weight, eats healthy, does not eat between meals.; Counseled on importance of regular laboratory monitoring as prescribed. The patient has regular lab work.; Reviewed Importance of taking all medications as prescribed. Takes medications as prescribed.  Reviewed Importance of attending all scheduled provider appointments. Next appointment with pcp is this afternoon for follow up, reminder given today.  Advised to report any changes in symptoms or exercise tolerance Advised patient to discuss changes in CAD or heart health with provider; Screening for signs and symptoms of depression related to chronic disease state;  Assessed social determinant of health barriers;   Symptom Management: Take medications as prescribed   Attend all scheduled provider appointments Call provider office for new concerns or questions  call the Suicide and Crisis Lifeline: 988 call the [place] National Suicide Prevention Lifeline: 6010084963 or TTY: 848-413-5205 TTY 351-441-7213) to talk to a trained counselor call 1-800-273-TALK (toll free, 24 hour hotline) if experiencing a Mental Health or Behavioral Health Crisis   Follow Up Plan: Telephone follow up appointment with care management team member scheduled for: 10-02-2022 at 1030 am          Patient verbalizes understanding of instructions and care plan provided today and agrees  to view in MyChart. Active MyChart status and patient understanding of how to access instructions and care plan via MyChart confirmed with patient.  Telephone follow up appointment with care management team member scheduled for: 10-02-2022 at 1030 am

## 2022-08-09 ENCOUNTER — Ambulatory Visit: Payer: Medicare Other | Attending: Radiation Oncology | Admitting: Radiation Oncology

## 2022-08-09 NOTE — Telephone Encounter (Signed)
Requested Prescriptions  Pending Prescriptions Disp Refills   isosorbide mononitrate (IMDUR) 60 MG 24 hr tablet [Pharmacy Med Name: ISOSORBIDE MONONITRATE ER 60 MG TAB] 90 tablet 1    Sig: TAKE ONE TABLET BY MOUTH AT BEDTIME     Cardiovascular:  Nitrates Passed - 08/08/2022  9:43 AM      Passed - Last BP in normal range    BP Readings from Last 1 Encounters:  08/08/22 (!) 106/48         Passed - Last Heart Rate in normal range    Pulse Readings from Last 1 Encounters:  08/08/22 80         Passed - Valid encounter within last 12 months    Recent Outpatient Visits           Yesterday Chronic midline back pain, unspecified back location   [place] [facility] [place], Megan P, DO   1 month ago Type 2 diabetes mellitus with diabetic peripheral angiopathy without gangrene, without long-term current use of insulin (HCC)   [place] [facility] [place], Megan P, DO   2 months ago Bartholin gland cyst   [place] [facility] [place], Megan P, DO   3 months ago Uncontrolled type 2 diabetes mellitus with hyperglycemia, with long-term current use of insulin (HCC)   [place] [facility] [place], Megan P, DO   4 months ago Uncontrolled type 2 diabetes mellitus with hyperglycemia, with long-term current use of insulin [facility])   [place] [facility] [place], Oralia Rud, DO       Future Appointments             In 3 weeks Laural Benes, Oralia Rud, DO [place] [facility], [facility]

## 2022-08-11 DIAGNOSIS — E1159 Type 2 diabetes mellitus with other circulatory complications: Secondary | ICD-10-CM | POA: Diagnosis not present

## 2022-08-11 DIAGNOSIS — Z170 Estrogen receptor positive status [ER+]: Secondary | ICD-10-CM | POA: Diagnosis not present

## 2022-08-11 DIAGNOSIS — C50312 Malignant neoplasm of lower-inner quadrant of left female breast: Secondary | ICD-10-CM | POA: Diagnosis not present

## 2022-08-11 DIAGNOSIS — Z7984 Long term (current) use of oral hypoglycemic drugs: Secondary | ICD-10-CM

## 2022-08-11 DIAGNOSIS — I2510 Atherosclerotic heart disease of native coronary artery without angina pectoris: Secondary | ICD-10-CM | POA: Diagnosis not present

## 2022-08-15 ENCOUNTER — Other Ambulatory Visit: Payer: Self-pay | Admitting: Family Medicine

## 2022-08-15 ENCOUNTER — Telehealth: Payer: Self-pay

## 2022-08-15 NOTE — Telephone Encounter (Signed)
Requested Prescriptions  Pending Prescriptions Disp Refills   montelukast (SINGULAIR) 10 MG tablet [Pharmacy Med Name: MONTELUKAST SODIUM 10 MG TAB] 90 tablet 0    Sig: TAKE 1 TABLET BY MOUTH NIGHTLY     Pulmonology:  Leukotriene Inhibitors Passed - 08/15/2022 12:28 PM      Passed - Valid encounter within last 12 months    Recent Outpatient Visits           1 week ago Chronic midline back pain, unspecified back location   [facility] [place], Megan P, DO   1 month ago Type 2 diabetes mellitus with diabetic peripheral angiopathy without gangrene, without long-term current use of insulin (HCC)   [place] [facility] [place], Megan P, DO   2 months ago Bartholin gland cyst   [place] [facility] [place], Megan P, DO   3 months ago Uncontrolled type 2 diabetes mellitus with hyperglycemia, with long-term current use of insulin (HCC)   [place] [facility] [place], Megan P, DO   4 months ago Uncontrolled type 2 diabetes mellitus with hyperglycemia, with long-term current use of insulin [facility])   [place] [facility] [place], Oralia Rud, DO       Future Appointments             In 2 weeks Laural Benes, Oralia Rud, DO  [facility], [facility]

## 2022-08-15 NOTE — Progress Notes (Unsigned)
PROVIDER NOTE: Information contained herein reflects review and annotations entered in association with encounter. Interpretation of such information and data should be left to medically-trained personnel. Information provided to patient can be located elsewhere in the medical record under "Patient Instructions". Document created using STT-dictation technology, any transcriptional errors that may result from process are unintentional.    Patient: Tammy [name]  Service Category: E/M  Provider: Oswaldo Done, MD  DOB: Jul 31, 1945  DOS: 08/16/2022  Referring Provider: Dorcas Carrow, DO  MRN: [id number]  Specialty: Interventional Pain Management  PCP: Dorcas Carrow, DO  Type: Established Patient  Setting: Ambulatory outpatient    Location: Office  Delivery: Face-to-face     HPI  [name], a 77 y.o. year old female, is here today because of her Spinal cord stimulator dysfunction, sequela [T85.192S]. Tammy [name] primary complain today is No chief complaint on file.  Pertinent problems: Tammy [name] has History of back surgery; Intermittent left-sided chest pain; Neuralgia neuritis, sciatic nerve; Herniated nucleus pulposus; Chronic low back pain (1ry area of Pain) (Bilateral) (R>L) w/o sciatica; Gait abnormality; Chronic knee pain (Right); Carcinoma of lower-inner quadrant of left breast in female, estrogen receptor positive (HCC); Malignant tumor of breast (HCC); Cervical radiculopathy; Sacroiliac inflammation (HCC); Osteoarthritis of spine with radiculopathy, cervical region; Osteoarthritis of spine with radiculopathy, thoracic region; Primary cancer of left upper lobe of lung (HCC); Chronic pain syndrome; Grade 1 Anterolisthesis of cervical spine (C3/C4, C4/C5); Abnormal MRI, cervical spine (11/16/2021); Cervical spinal stenosis (C5-6, C6-7); Cervical foraminal stenosis; Cervical facet arthropathy; Cervical facet hypertrophy (Multilevel); Abnormal x-ray of cervical spine (10/01/2020); Osteoarthritis of  AC (acromioclavicular) joint (Left); Osteoarthritis of glenohumeral joint (Left); Primary osteoarthritis of shoulder (Left); Neurostimulator device in situ (T8-9 leads); Grade 1 Anterolisthesis of lumbar spine (L4/L5); Grade 1 Retrolisthesis of lumbar spine (L2/L3, L3/L4); DDD (degenerative disc disease), cervical; DDD (degenerative disc disease), lumbar; Abnormal MRI, lumbar spine (04/17/2017); History of lumbar spinal fusion; Failed back surgical syndrome; Abnormal CT scan, lumbar spine (02/14/2019); Primary osteoarthritis of knee (Right); Primary osteoarthritis of knee (Left); Chronic knee pain (3ry area of Pain) (Bilateral) (R>L); Coccygodynia; Chronic lower extremity pain (2ry area of Pain) (Right); Leg cramps; Chronic knee pain s/p TKR (Right); Chronic neck pain (4th area of Pain) (Bilateral) (R>L); and Spinal cord stimulator dysfunction, sequela on their pertinent problem list. Pain Assessment: Severity of   is reported as a  /10. Location:    / . Onset:  . Quality:  . Timing:  . Modifying factor(s):  [name] Vitals:  vitals were not taken for this visit.  BMI: Estimated body mass index is 31.04 kg/m as calculated from the following:   Height as of 07/12/22: 5\' 6"  (1.676 m).   Weight as of 08/01/22: 192 lb 4.8 oz (87.2 kg). Last encounter: 07/12/2022. Last procedure: Visit date not found.  Reason for encounter:  *** . ***  Pharmacotherapy Assessment  Analgesic: No chronic opioid analgesics therapy prescribed by our practice. None MME/day: 0 mg/day   Monitoring: [place] PMP: PDMP reviewed during this encounter.       Pharmacotherapy: No side-effects or adverse reactions reported. Compliance: No problems identified. Effectiveness: Clinically acceptable.  No notes on file  No results found for: "CBDTHCR" No results found for: "D8THCCBX" No results found for: "D9THCCBX"  UDS:  Summary  Date Value Ref Range Status  06/28/2022 Note  Final    Comment:     ==================================================================== Compliance Drug Analysis, Ur ==================================================================== Test  Result       Flag       Units  Drug Present and Declared for Prescription Verification   Gabapentin                     PRESENT      EXPECTED   Duloxetine                     PRESENT      EXPECTED   Acetaminophen                  PRESENT      EXPECTED   Metoprolol                     PRESENT      EXPECTED ==================================================================== Test                      Result    Flag   Units      Ref Range   Creatinine              133              mg/dL      >=04 ==================================================================== Declared Medications:  The flagging and interpretation on this report are based on the  following declared medications.  Unexpected results may arise from  inaccuracies in the declared medications.   **Note: The testing scope of this panel includes these medications:   Duloxetine (Cymbalta)  Gabapentin (Neurontin)  Metoprolol (Lopressor)   **Note: The testing scope of this panel does not include small to  moderate amounts of these reported medications:   Acetaminophen (Tylenol)   **Note: The testing scope of this panel does not include the  following reported medications:   Albuterol  Anastrozole (Arimidex)  Apixaban (Eliquis)  Calcium  Fluticasone (Flonase)  Fluticasone (Trelegy)  Glipizide (Glucotrol)  Insulin Evaristo Bury)  Isosorbide (Imdur)  Levothyroxine (Synthroid)  Mirabegron (Myrbetriq)  Montelukast (Singulair)  Nitroglycerin (Nitrostat)  Ondansetron (Zofran)  Pantoprazole (Protonix)  Rosuvastatin (Crestor)  Supplement  Tirzepatide (Mounjaro)  Trimethoprim  Umeclidinium (Trelegy)  Vilanterol (Trelegy)  Vitamin D  Vitamin D3  Zinc ==================================================================== For  clinical consultation, please call [phone]. ====================================================================       ROS  Constitutional: Denies any fever or chills Gastrointestinal: No reported hemesis, hematochezia, vomiting, or acute GI distress Musculoskeletal: Denies any acute onset joint swelling, redness, loss of ROM, or weakness Neurological: No reported episodes of acute onset apraxia, aphasia, dysarthria, agnosia, amnesia, paralysis, loss of coordination, or loss of consciousness  Medication Review  Calcium Carb-Cholecalciferol, Cranberry, DULoxetine, Fluticasone-Umeclidin-Vilant, Pumpkin Seed-Soy Germ, UNABLE TO FIND, Zinc Citrate, acetaminophen, albuterol, anastrozole, apixaban, cefUROXime, cholecalciferol, fluticasone, gabapentin, glipiZIDE, insulin degludec, isosorbide mononitrate, levothyroxine, metoprolol tartrate, mirabegron ER, montelukast, nitroGLYCERIN, ondansetron, pantoprazole, rosuvastatin, tirzepatide, and trimethoprim  History Review  Allergy: Tammy [name] is allergic to hydrocodone, aleve [naproxen sodium], contrast media [iodinated contrast media], ioxaglate, oxycodone, ranexa [ranolazine], sulfa antibiotics, and tramadol. Drug: Tammy [name]  reports no history of drug use. Alcohol:  reports no history of alcohol use. Tobacco:  reports that she quit smoking about 15 years ago. Her smoking use included cigarettes. She has a 7.50 pack-year smoking history. She has never used smokeless tobacco. Social: Tammy [name]  reports that she quit smoking about 15 years ago. Her smoking use included cigarettes. She has a 7.50 pack-year smoking history. She has never used smokeless tobacco. She reports that she does not drink  alcohol and does not use drugs. Medical:  has a past medical history of Asthma, Benign neoplasm of colon, Breast cancer (HCC) (08/2018), Cervical disc disease, Chronic diastolic CHF (congestive heart failure) (HCC), CKD (chronic kidney disease), stage III (HCC),  COPD (chronic obstructive pulmonary disease) (HCC), Coronary artery disease, Diabetes mellitus without complication (HCC), Diagnosis unknown (05/07/2022), Fusion of lumbar spine (12/22/2015), GERD (gastroesophageal reflux disease), History of DVT (deep vein thrombosis), History of tobacco abuse, Hyperlipidemia, Hypertension, Hypothyroidism, Lung cancer (HCC), Malignant neoplasm of unspecified site of right female breast (HCC) (03/14/1999), Obesity, Palpitations, Personal history of radiation therapy, PONV (postoperative nausea and vomiting), Pulmonary embolus (HCC) (12/25/2013), PVD (peripheral vascular disease) (HCC), and Stroke (HCC) (2015). Surgical: Tammy [name]  has a past surgical history that includes Angioplasty / stenting femoral; Trigger finger release; Thyroidectomy; Cholecystectomy; Cardiac catheterization (12/07/2011); Cardiac catheterization (01/04/2011); Colonoscopy (2013); laparoscopic appendectomy (N/A, 01/06/2015); Appendectomy (12/2014); Colonoscopy with propofol (N/A, 05/24/2015); polypectomy (05/24/2015); Mastectomy w/ sentinel node biopsy (Left, 08/26/2018); Breast surgery; Diagnostic laparoscopy; Colonoscopy with propofol (N/A, 01/28/2019); Esophagogastroduodenoscopy (egd) with propofol (N/A, 01/28/2019); Back surgery (03/2012); Back surgery; Back surgery (11/2016); LEFT HEART CATH AND CORONARY ANGIOGRAPHY (Left, 04/04/2019); Total knee arthroplasty (Right, 11/13/2019); Breast lumpectomy (Left, 08/26/2018); Replacement total knee (Right); RIGHT/LEFT HEART CATH AND CORONARY ANGIOGRAPHY (Bilateral, 09/26/2021); and Cataract extraction (Right, 12/21/2021). Family: family history includes COPD in her sister and sister; Cancer in her mother and sister; Colon cancer (age of onset: 32) in her brother; Diabetes in her brother, brother, brother, and father; Heart attack in her brother, brother, brother, and father; Heart disease in her brother, brother, brother, and father; Hypertension in her brother,  brother, brother, and father; Lung cancer in her brother.  Laboratory Chemistry Profile   Renal Lab Results  Component Value Date   BUN 31 (H) 08/01/2022   CREATININE 1.46 (H) 08/01/2022   BCR 15 07/10/2022   GFRAA 32 (L) 02/10/2020   GFRNONAA 37 (L) 08/01/2022    Hepatic Lab Results  Component Value Date   AST 18 06/28/2022   ALT 22 03/16/2022   ALBUMIN 4.0 06/28/2022   ALKPHOS 91 06/28/2022   LIPASE 62 (H) 06/08/2021    Electrolytes Lab Results  Component Value Date   NA 135 08/01/2022   K 4.6 08/01/2022   CL 103 08/01/2022   CALCIUM 9.2 08/01/2022   MG 2.2 06/28/2022   PHOS 2.6 03/10/2022    Bone Lab Results  Component Value Date   VD25OH 45 11/23/2021   25OHVITD1 42 06/28/2022   25OHVITD2 <1.0 06/28/2022   25OHVITD3 41 06/28/2022    Inflammation (CRP: Acute Phase) (ESR: Chronic Phase) Lab Results  Component Value Date   CRP 5 06/28/2022   ESRSEDRATE 26 06/28/2022   LATICACIDVEN 0.9 03/08/2022         Note: Above Lab results reviewed.  Recent Imaging Review  MM 3D SCREENING MAMMOGRAM BILATERAL BREAST CLINICAL DATA:  Screening.  EXAM: DIGITAL SCREENING BILATERAL MAMMOGRAM WITH TOMOSYNTHESIS AND CAD  TECHNIQUE: Bilateral screening digital craniocaudal and mediolateral oblique mammograms were obtained. Bilateral screening digital breast tomosynthesis was performed. The images were evaluated with computer-aided detection.  COMPARISON:  Previous exam(s).  ACR Breast Density Category a: The breasts are almost entirely fatty.  FINDINGS: There are no findings suspicious for malignancy.  IMPRESSION: No mammographic evidence of malignancy. A result letter of this screening mammogram will be mailed directly to the patient.  RECOMMENDATION: Screening mammogram in one year. (Code:[id number]-B-01Y)  BI-RADS CATEGORY  1: Negative.  Electronically Signed   By: Viviann Spare  Azucena Kuba M.D.   On: 08/03/2022 16:42 Note: Reviewed        Physical Exam  General  appearance: Well nourished, well developed, and well hydrated. In no apparent acute distress Mental status: Alert, oriented x 3 (person, place, & time)       Respiratory: No evidence of acute respiratory distress Eyes: PERLA Vitals: There were no vitals taken for this visit. BMI: Estimated body mass index is 31.04 kg/m as calculated from the following:   Height as of 07/12/22: 5\' 6"  (1.676 m).   Weight as of 08/01/22: 192 lb 4.8 oz (87.2 kg). Ideal: Ideal body weight: 59.3 kg (130 lb 11.7 oz) Adjusted ideal body weight: 70.5 kg (155 lb 5.8 oz)  Assessment   Diagnosis Status  1. Spinal cord stimulator dysfunction, sequela   2. Neurostimulator device in situ (T8-9 leads)    Controlled Controlled Controlled   Updated Problems: No problems updated.  Plan of Care  Problem-specific:  No problem-specific Assessment & Plan notes found for this encounter.  Tammy [name] has a current medication list which includes the following long-term medication(s): albuterol, albuterol, apixaban, calcium 600 + d, duloxetine, fluticasone, gabapentin, glipizide, isosorbide mononitrate, metoprolol tartrate, montelukast, myrbetriq, nitroglycerin, pantoprazole, rosuvastatin, and synthroid.  Pharmacotherapy (Medications Ordered): No orders of the defined types were placed in this encounter.  Orders:  No orders of the defined types were placed in this encounter.  Follow-up plan:   No follow-ups on file.      Interventional Therapies  Risk Factors  Considerations:  Eliquis Anticoagulation: (Stop: 3 days  Restart: 6 hours)   Allergy: CONTRAST Dye, Iodine, Hydrocodone, Oxycodone, Tramadol, Naproxen (Aleve)  IDDM  PVD w/ LE stents  Hx. Stroke      Normal. Non-continuous.    Planned  Pending:      Under consideration:      Completed:   None at this time   Therapeutic  Palliative (PRN) options:   Unknown ancillary procedure (04/05/2021) by Merri Ray, DO [facility])  Permanent  spinal cord stimulator implant by Rockwell Germany, MD ([facility] surgeon)    Completed by other providers:   (06/22/2017) bilateral lower extremity EMG/PNCV (normal) by [facility]  Spinal cord stimulator implant        Recent Visits Date Type Provider Dept  07/12/22 Office Visit Delano Metz, MD [facility]  06/28/22 Office Visit Delano Metz, MD [facility]  Showing recent visits within past 90 days and meeting all other requirements Future Appointments Date Type Provider Dept  08/16/22 Appointment Delano Metz, MD [facility]  Showing future appointments within next 90 days and meeting all other requirements  I discussed the assessment and treatment plan with the patient. The patient was provided an opportunity to ask questions and all were answered. The patient agreed with the plan and demonstrated an understanding of the instructions.  Patient advised to call back or seek an in-person evaluation if the symptoms or condition worsens.  Duration of encounter: *** minutes.  Total time on encounter, as per AMA guidelines included both the face-to-face and non-face-to-face time personally spent by the physician and/or other qualified health care professional(s) on the day of the encounter (includes time in activities that require the physician or other qualified health care professional and does not include time in activities normally performed by clinical staff). Physician's time may include the following activities when performed: Preparing to see the patient (e.g., pre-charting review of records, searching for previously ordered imaging,  lab work, and nerve conduction tests) Review of prior analgesic pharmacotherapies. Reviewing PMP Interpreting ordered tests (e.g., lab work, imaging, nerve conduction tests) Performing post-procedure evaluations, including interpretation of diagnostic procedures Obtaining and/or reviewing  separately obtained history Performing a medically appropriate examination and/or evaluation Counseling and educating the patient/family/caregiver Ordering medications, tests, or procedures Referring and communicating with other health care professionals (when not separately reported) Documenting clinical information in the electronic or other health record Independently interpreting results (not separately reported) and communicating results to the patient/ family/caregiver Care coordination (not separately reported)  Note by: Oswaldo Done, MD Date: 08/16/2022; Time: 12:16 PM

## 2022-08-15 NOTE — Progress Notes (Cosign Needed)
Care Management & Coordination Services Pharmacy Team  Reason for Encounter: Diabetes  Contacted patient to discuss diabetes disease state. Unsuccessful outreach. Left voicemail for patient to return call.   Recent office visits:  08/08/22-Megan Holly Bodily, DO (PCP) Seen for back pain. Follow up in 4 weeks.  06/20/22-Megan Holly Bodily, DO (PCP) Seen for diabetes and back problem. Labs ordered. Increase gabapentin to 600 mg TID. Follow up in 4 weeks.  05/18/22-Megan Holly Bodily, DO (PCP) Seen for vulvar lesion. Start on doxycycline. Follow up in 4 weeks.   Recent consult visits:  08/01/22-Govinda Leeanne Deed, MD (Oncology) Seen for breast cancer. Follow up in 6 months.  07/19/22-James Odis Luster, MD ([facility]) Notes not available.  07/12/22-Francisco Laban Emperor, MD (Pain medicine) Seen for evaluation before starting new chronic pain management plan of care. Follow up in 2 weeks.  06/28/22--Francisco Laban Emperor, MD (Pain medicine) Seen for initial evaluation of one or more chronic problems (new to examiner) potentially causing chronic pain, and posing a threat to normal musculoskeletal function. Labs ordered. Imaging orders DG Cervical Spine With Flex & Extend & DG Lumbar Spine Complete W/Bend. Follow up in 2 weeks.  05/17/22-Diego Ronnette Juniper, MD (General surgery) Seen for a follow up visit to discuss CT of the chest.   Hospital visits:  None in previous 6 months  Medications: Outpatient Encounter Medications as of 08/15/2022  Medication Sig   acetaminophen (TYLENOL) 500 MG tablet Take 500 mg by mouth every 6 (six) hours as needed for moderate pain. Takes up to 3 tablets/day PRN for increased pain   albuterol (PROVENTIL) (2.5 MG/3ML) 0.083% nebulizer solution Take 3 mLs (2.5 mg total) by nebulization every 6 (six) hours as needed for wheezing or shortness of breath.   albuterol (VENTOLIN HFA) 108 (90 Base) MCG/ACT inhaler Inhale 2 puffs into the lungs every 6 (six) hours as needed for wheezing or  shortness of breath.   anastrozole (ARIMIDEX) 1 MG tablet TAKE 1 TABLET BY MOUTH DAILY   apixaban (ELIQUIS) 2.5 MG TABS tablet Take 1 tablet (2.5 mg total) by mouth 2 (two) times daily.   Calcium Carb-Cholecalciferol (CALCIUM 600 + D) 600-200 MG-UNIT TABS Take 1 tablet by mouth daily.    cefUROXime (CEFTIN) 500 MG tablet    cholecalciferol (VITAMIN D3) 25 MCG (1000 UT) tablet Take 1,000 Units by mouth daily.   CRANBERRY PO Take 2 capsules by mouth daily.   DULoxetine (CYMBALTA) 60 MG capsule Take 2 capsules (120 mg total) by mouth daily.   fluticasone (FLONASE) 50 MCG/ACT nasal spray Place 1 spray into both nostrils daily.   Fluticasone-Umeclidin-Vilant (TRELEGY ELLIPTA) 100-62.5-25 MCG/ACT AEPB Inhale 1 puff into the lungs daily.   gabapentin (NEURONTIN) 300 MG capsule Take 2 capsules (600 mg total) by mouth 3 (three) times daily. (Patient taking differently: Take 600 mg by mouth 3 (three) times daily. Takes 2 tablets in the morning and 3 tablets at night)   glipiZIDE (GLUCOTROL) 5 MG tablet TAKE 1 TABLET BY MOUTH DAILY BEFORE BREAKFAST   isosorbide mononitrate (IMDUR) 60 MG 24 hr tablet TAKE ONE TABLET BY MOUTH AT BEDTIME   metoprolol tartrate (LOPRESSOR) 25 MG tablet Take 1 tablet (25 mg total) by mouth 2 (two) times daily.   montelukast (SINGULAIR) 10 MG tablet Take 1 tablet (10 mg total) by mouth at bedtime.   MYRBETRIQ 50 MG TB24 tablet TAKE 1 TABLET BY MOUTH DAILY   nitroGLYCERIN (NITROSTAT) 0.4 MG SL tablet Place 1 tablet (0.4 mg total) under the tongue every 5 (five) minutes  as needed for chest pain. Total of 3 doses   ondansetron (ZOFRAN) 4 MG tablet Take 1 tablet (4 mg total) by mouth every 6 (six) hours as needed for nausea. (Patient not taking: Reported on 08/01/2022)   pantoprazole (PROTONIX) 40 MG tablet TAKE 1 TABLET BY MOUTH DAILY   Pumpkin Seed-Soy Germ (AZO BLADDER CONTROL/GO-LESS PO) Take 1 tablet by mouth in the morning and at bedtime.   rosuvastatin (CRESTOR) 40 MG tablet  TAKE 1 TABLET BY MOUTH DAILY   SYNTHROID 150 MCG tablet Take 1 tablet (150 mcg total) by mouth daily before breakfast.   tirzepatide (MOUNJARO) 12.5 MG/0.5ML Pen Inject 12.5 mg into the skin once a week.   TRESIBA FLEXTOUCH 100 UNIT/ML FlexTouch Pen Inject into the skin.   trimethoprim (TRIMPEX) 100 MG tablet TAKE ONE TABLET BY MOUTH EVERY DAY   UNABLE TO FIND Med Name: Energy, memory and mood enhancer   ZINC CITRATE PO Take 1 tablet by mouth daily.   No facility-administered encounter medications on file as of 08/15/2022.    Recent Relevant Labs: Lab Results  Component Value Date/Time   HGBA1C 6.5 (H) 06/20/2022 02:00 PM   HGBA1C 7.4 (H) 03/16/2022 01:23 PM   HGBA1C 7.5 11/23/2021 12:00 AM   HGBA1C 8.7 01/31/2016 12:00 AM   MICROALBUR 150 (H) 06/20/2022 02:00 PM   MICROALBUR 150 (H) 08/19/2020 10:11 AM    Kidney Function Lab Results  Component Value Date/Time   CREATININE 1.46 (H) 08/01/2022 03:32 PM   CREATININE 1.62 (H) 07/10/2022 10:20 AM   CREATININE 1.31 (H) 09/20/2011 11:34 AM   GFRNONAA 37 (L) 08/01/2022 03:32 PM   GFRNONAA 42 (L) 09/20/2011 11:34 AM   GFRAA 32 (L) 02/10/2020 02:40 PM   GFRAA 48 (L) 09/20/2011 11:34 AM   Current antihyperglycemic regimen:  Glipizide 5 mg take 1 tablet daily Tresiba 100 units injetc 30 units into the skin Mounjaro 12,5 mg weekly    Adherence Review: Is the patient currently on a STATIN medication? Yes Is the patient currently on ACE/ARB medication? No Does the patient have >5 day gap between last estimated fill dates? No  Star Rating Drugs:  Rosuvastatin 40 mg Last filled:04/04/22 90 DS, 07/04/22 90 DS Glipizide 5 mg Last filled:02/25/22 90 DS, 06/01/22 90 DS   Care Gaps: Annual wellness visit in last year? Yes 12/26/21 Last eye exam / retinopathy screening:11/09/21 Last diabetic foot exam:09/08/21   Rance Muir, RMA

## 2022-08-16 ENCOUNTER — Ambulatory Visit (HOSPITAL_BASED_OUTPATIENT_CLINIC_OR_DEPARTMENT_OTHER): Payer: Medicare Other | Admitting: Pain Medicine

## 2022-08-16 DIAGNOSIS — Z9682 Presence of neurostimulator: Secondary | ICD-10-CM

## 2022-08-16 DIAGNOSIS — T85192S Other mechanical complication of implanted electronic neurostimulator (electrode) of spinal cord, sequela: Secondary | ICD-10-CM

## 2022-08-16 DIAGNOSIS — Z91199 Patient's noncompliance with other medical treatment and regimen due to unspecified reason: Secondary | ICD-10-CM

## 2022-08-17 ENCOUNTER — Telehealth: Payer: Self-pay | Admitting: Pain Medicine

## 2022-08-17 NOTE — Telephone Encounter (Signed)
Error

## 2022-08-20 NOTE — Progress Notes (Unsigned)
PROVIDER NOTE: Information contained herein reflects review and annotations entered in association with encounter. Interpretation of such information and data should be left to medically-trained personnel. Information provided to patient can be located elsewhere in the medical record under "Patient Instructions". Document created using STT-dictation technology, any transcriptional errors that may result from process are unintentional.    Patient: Tammy [name]  Service Category: E/M  Provider: Oswaldo Done, MD  DOB: 1945-11-11  DOS: 08/21/2022  Referring Provider: Dorcas Carrow, DO  MRN: [id number]  Specialty: Interventional Pain Management  PCP: Dorcas Carrow, DO  Type: Established Patient  Setting: Ambulatory outpatient    Location: Office  Delivery: Face-to-face     HPI  Tammy [name], a 77 y.o. year old female, is here today because of her No primary diagnosis found.. Tammy [name] primary complain today is No chief complaint on file.  Pertinent problems: Tammy [name] has History of back surgery; Intermittent left-sided chest pain; Neuralgia neuritis, sciatic nerve; Herniated nucleus pulposus; Chronic low back pain (1ry area of Pain) (Bilateral) (R>L) w/o sciatica; Gait abnormality; Chronic knee pain (Right); Carcinoma of lower-inner quadrant of left breast in female, estrogen receptor positive (HCC); Malignant tumor of breast (HCC); Cervical radiculopathy; Sacroiliac inflammation (HCC); Osteoarthritis of spine with radiculopathy, cervical region; Osteoarthritis of spine with radiculopathy, thoracic region; Primary cancer of left upper lobe of lung (HCC); Chronic pain syndrome; Grade 1 Anterolisthesis of cervical spine (C3/C4, C4/C5); Abnormal MRI, cervical spine (11/16/2021); Cervical spinal stenosis (C5-6, C6-7); Cervical foraminal stenosis; Cervical facet arthropathy; Cervical facet hypertrophy (Multilevel); Abnormal x-ray of cervical spine (10/01/2020); Osteoarthritis of AC (acromioclavicular)  joint (Left); Osteoarthritis of glenohumeral joint (Left); Primary osteoarthritis of shoulder (Left); Neurostimulator device in situ (T8-9 leads); Grade 1 Anterolisthesis of lumbar spine (L4/L5); Grade 1 Retrolisthesis of lumbar spine (L2/L3, L3/L4); DDD (degenerative disc disease), cervical; DDD (degenerative disc disease), lumbar; Abnormal MRI, lumbar spine (04/17/2017); History of lumbar spinal fusion; Failed back surgical syndrome; Abnormal CT scan, lumbar spine (02/14/2019); Primary osteoarthritis of knee (Right); Primary osteoarthritis of knee (Left); Chronic knee pain (3ry area of Pain) (Bilateral) (R>L); Coccygodynia; Chronic lower extremity pain (2ry area of Pain) (Right); Leg cramps; Chronic knee pain s/p TKR (Right); Chronic neck pain (4th area of Pain) (Bilateral) (R>L); and Spinal cord stimulator dysfunction, sequela on their pertinent problem list. Pain Assessment: Severity of   is reported as a  /10. Location:    / . Onset:  . Quality:  . Timing:  . Modifying factor(s):  [name] Vitals:  vitals were not taken for this visit.  BMI: Estimated body mass index is 31.04 kg/m as calculated from the following:   Height as of 07/12/22: 5\' 6"  (1.676 m).   Weight as of 08/01/22: 192 lb 4.8 oz (87.2 kg). Last encounter: 08/16/2022. Last procedure: Visit date not found.  Reason for encounter:  *** . ***  Pharmacotherapy Assessment  Analgesic: No chronic opioid analgesics therapy prescribed by our practice. None MME/day: 0 mg/day   Monitoring: [place] PMP: PDMP reviewed during this encounter.       Pharmacotherapy: No side-effects or adverse reactions reported. Compliance: No problems identified. Effectiveness: Clinically acceptable.  No notes on file  No results found for: "CBDTHCR" No results found for: "D8THCCBX" No results found for: "D9THCCBX"  UDS:  Summary  Date Value Ref Range Status  06/28/2022 Note  Final    Comment:     ==================================================================== Compliance Drug Analysis, Ur ==================================================================== Test  Result       Flag       Units  Drug Present and Declared for Prescription Verification   Gabapentin                     PRESENT      EXPECTED   Duloxetine                     PRESENT      EXPECTED   Acetaminophen                  PRESENT      EXPECTED   Metoprolol                     PRESENT      EXPECTED ==================================================================== Test                      Result    Flag   Units      Ref Range   Creatinine              133              mg/dL      >=04 ==================================================================== Declared Medications:  The flagging and interpretation on this report are based on the  following declared medications.  Unexpected results may arise from  inaccuracies in the declared medications.   **Note: The testing scope of this panel includes these medications:   Duloxetine (Cymbalta)  Gabapentin (Neurontin)  Metoprolol (Lopressor)   **Note: The testing scope of this panel does not include small to  moderate amounts of these reported medications:   Acetaminophen (Tylenol)   **Note: The testing scope of this panel does not include the  following reported medications:   Albuterol  Anastrozole (Arimidex)  Apixaban (Eliquis)  Calcium  Fluticasone (Flonase)  Fluticasone (Trelegy)  Glipizide (Glucotrol)  Insulin Evaristo Bury)  Isosorbide (Imdur)  Levothyroxine (Synthroid)  Mirabegron (Myrbetriq)  Montelukast (Singulair)  Nitroglycerin (Nitrostat)  Ondansetron (Zofran)  Pantoprazole (Protonix)  Rosuvastatin (Crestor)  Supplement  Tirzepatide (Mounjaro)  Trimethoprim  Umeclidinium (Trelegy)  Vilanterol (Trelegy)  Vitamin D  Vitamin D3  Zinc ==================================================================== For  clinical consultation, please call [phone]. ====================================================================       ROS  Constitutional: Denies any fever or chills Gastrointestinal: No reported hemesis, hematochezia, vomiting, or acute GI distress Musculoskeletal: Denies any acute onset joint swelling, redness, loss of ROM, or weakness Neurological: No reported episodes of acute onset apraxia, aphasia, dysarthria, agnosia, amnesia, paralysis, loss of coordination, or loss of consciousness  Medication Review  Calcium Carb-Cholecalciferol, Cranberry, DULoxetine, Fluticasone-Umeclidin-Vilant, Pumpkin Seed-Soy Germ, UNABLE TO FIND, Zinc Citrate, acetaminophen, albuterol, anastrozole, apixaban, cefUROXime, cholecalciferol, fluticasone, gabapentin, glipiZIDE, insulin degludec, isosorbide mononitrate, levothyroxine, metoprolol tartrate, mirabegron ER, montelukast, nitroGLYCERIN, ondansetron, pantoprazole, rosuvastatin, tirzepatide, and trimethoprim  History Review  Allergy: Tammy [name] is allergic to hydrocodone, aleve [naproxen sodium], contrast media [iodinated contrast media], ioxaglate, oxycodone, ranexa [ranolazine], sulfa antibiotics, and tramadol. Drug: Tammy [name]  reports no history of drug use. Alcohol:  reports no history of alcohol use. Tobacco:  reports that she quit smoking about 15 years ago. Her smoking use included cigarettes. She has a 7.50 pack-year smoking history. She has never used smokeless tobacco. Social: Tammy [name]  reports that she quit smoking about 15 years ago. Her smoking use included cigarettes. She has a 7.50 pack-year smoking history. She has never used smokeless tobacco. She reports that she does not drink  alcohol and does not use drugs. Medical:  has a past medical history of Asthma, Benign neoplasm of colon, Breast cancer (HCC) (08/2018), Cervical disc disease, Chronic diastolic CHF (congestive heart failure) (HCC), CKD (chronic kidney disease), stage III (HCC),  COPD (chronic obstructive pulmonary disease) (HCC), Coronary artery disease, Diabetes mellitus without complication (HCC), Diagnosis unknown (05/07/2022), Fusion of lumbar spine (12/22/2015), GERD (gastroesophageal reflux disease), History of DVT (deep vein thrombosis), History of tobacco abuse, Hyperlipidemia, Hypertension, Hypothyroidism, Lung cancer (HCC), Malignant neoplasm of unspecified site of right female breast (HCC) (03/14/1999), Obesity, Palpitations, Personal history of radiation therapy, PONV (postoperative nausea and vomiting), Pulmonary embolus (HCC) (12/25/2013), PVD (peripheral vascular disease) (HCC), and Stroke (HCC) (2015). Surgical: Tammy [name]  has a past surgical history that includes Angioplasty / stenting femoral; Trigger finger release; Thyroidectomy; Cholecystectomy; Cardiac catheterization (12/07/2011); Cardiac catheterization (01/04/2011); Colonoscopy (2013); laparoscopic appendectomy (N/A, 01/06/2015); Appendectomy (12/2014); Colonoscopy with propofol (N/A, 05/24/2015); polypectomy (05/24/2015); Mastectomy w/ sentinel node biopsy (Left, 08/26/2018); Breast surgery; Diagnostic laparoscopy; Colonoscopy with propofol (N/A, 01/28/2019); Esophagogastroduodenoscopy (egd) with propofol (N/A, 01/28/2019); Back surgery (03/2012); Back surgery; Back surgery (11/2016); LEFT HEART CATH AND CORONARY ANGIOGRAPHY (Left, 04/04/2019); Total knee arthroplasty (Right, 11/13/2019); Breast lumpectomy (Left, 08/26/2018); Replacement total knee (Right); RIGHT/LEFT HEART CATH AND CORONARY ANGIOGRAPHY (Bilateral, 09/26/2021); and Cataract extraction (Right, 12/21/2021). Family: family history includes COPD in her sister and sister; Cancer in her mother and sister; Colon cancer (age of onset: 32) in her brother; Diabetes in her brother, brother, brother, and father; Heart attack in her brother, brother, brother, and father; Heart disease in her brother, brother, brother, and father; Hypertension in her brother,  brother, brother, and father; Lung cancer in her brother.  Laboratory Chemistry Profile   Renal Lab Results  Component Value Date   BUN 31 (H) 08/01/2022   CREATININE 1.46 (H) 08/01/2022   BCR 15 07/10/2022   GFRAA 32 (L) 02/10/2020   GFRNONAA 37 (L) 08/01/2022    Hepatic Lab Results  Component Value Date   AST 18 06/28/2022   ALT 22 03/16/2022   ALBUMIN 4.0 06/28/2022   ALKPHOS 91 06/28/2022   LIPASE 62 (H) 06/08/2021    Electrolytes Lab Results  Component Value Date   NA 135 08/01/2022   K 4.6 08/01/2022   CL 103 08/01/2022   CALCIUM 9.2 08/01/2022   MG 2.2 06/28/2022   PHOS 2.6 03/10/2022    Bone Lab Results  Component Value Date   VD25OH 45 11/23/2021   25OHVITD1 42 06/28/2022   25OHVITD2 <1.0 06/28/2022   25OHVITD3 41 06/28/2022    Inflammation (CRP: Acute Phase) (ESR: Chronic Phase) Lab Results  Component Value Date   CRP 5 06/28/2022   ESRSEDRATE 26 06/28/2022   LATICACIDVEN 0.9 03/08/2022         Note: Above Lab results reviewed.  Recent Imaging Review  MM 3D SCREENING MAMMOGRAM BILATERAL BREAST CLINICAL DATA:  Screening.  EXAM: DIGITAL SCREENING BILATERAL MAMMOGRAM WITH TOMOSYNTHESIS AND CAD  TECHNIQUE: Bilateral screening digital craniocaudal and mediolateral oblique mammograms were obtained. Bilateral screening digital breast tomosynthesis was performed. The images were evaluated with computer-aided detection.  COMPARISON:  Previous exam(s).  ACR Breast Density Category a: The breasts are almost entirely fatty.  FINDINGS: There are no findings suspicious for malignancy.  IMPRESSION: No mammographic evidence of malignancy. A result letter of this screening mammogram will be mailed directly to the patient.  RECOMMENDATION: Screening mammogram in one year. (Code:SM-B-01Y)  BI-RADS CATEGORY  1: Negative.  Electronically Signed   By: Viviann Spare  Azucena Kuba M.D.   On: 08/03/2022 16:42 Note: Reviewed        Physical Exam  General  appearance: Well nourished, well developed, and well hydrated. In no apparent acute distress Mental status: Alert, oriented x 3 (person, place, & time)       Respiratory: No evidence of acute respiratory distress Eyes: PERLA Vitals: There were no vitals taken for this visit. BMI: Estimated body mass index is 31.04 kg/m as calculated from the following:   Height as of 07/12/22: 5\' 6"  (1.676 m).   Weight as of 08/01/22: 192 lb 4.8 oz (87.2 kg). Ideal: Patient weight not recorded  Assessment   Diagnosis Status  No diagnosis found. Controlled Controlled Controlled   Updated Problems: No problems updated.  Plan of Care  Problem-specific:  No problem-specific Assessment & Plan notes found for this encounter.  Tammy [name] has a current medication list which includes the following long-term medication(s): albuterol, albuterol, apixaban, calcium 600 + d, duloxetine, fluticasone, gabapentin, glipizide, isosorbide mononitrate, metoprolol tartrate, montelukast, myrbetriq, nitroglycerin, pantoprazole, rosuvastatin, and synthroid.  Pharmacotherapy (Medications Ordered): No orders of the defined types were placed in this encounter.  Orders:  No orders of the defined types were placed in this encounter.  Follow-up plan:   No follow-ups on file.      Interventional Therapies  Risk Factors  Considerations:  Eliquis Anticoagulation: (Stop: 3 days  Restart: 6 hours)   Allergy: CONTRAST Dye, Iodine, Hydrocodone, Oxycodone, Tramadol, Naproxen (Aleve)  IDDM  PVD w/ LE stents  Hx. Stroke      Normal. Non-continuous.    Planned  Pending:      Under consideration:      Completed:   None at this time   Therapeutic  Palliative (PRN) options:   Unknown ancillary procedure (04/05/2021) by Merri Ray, DO [facility])  Permanent spinal cord stimulator implant by Rockwell Germany, MD ([facility] surgeon)    Completed by other providers:   (06/22/2017) bilateral lower extremity EMG/PNCV  (normal) by [facility]  Spinal cord stimulator implant        Recent Visits Date Type Provider Dept  07/12/22 Office Visit Delano Metz, MD [facility]  06/28/22 Office Visit Delano Metz, MD [facility]  Showing recent visits within past 90 days and meeting all other requirements Future Appointments Date Type Provider Dept  08/21/22 Appointment Delano Metz, MD [facility]  Showing future appointments within next 90 days and meeting all other requirements  I discussed the assessment and treatment plan with the patient. The patient was provided an opportunity to ask questions and all were answered. The patient agreed with the plan and demonstrated an understanding of the instructions.  Patient advised to call back or seek an in-person evaluation if the symptoms or condition worsens.  Duration of encounter: *** minutes.  Total time on encounter, as per AMA guidelines included both the face-to-face and non-face-to-face time personally spent by the physician and/or other qualified health care professional(s) on the day of the encounter (includes time in activities that require the physician or other qualified health care professional and does not include time in activities normally performed by clinical staff). Physician's time may include the following activities when performed: Preparing to see the patient (e.g., pre-charting review of records, searching for previously ordered imaging, lab work, and nerve conduction tests) Review of prior analgesic pharmacotherapies. Reviewing PMP Interpreting ordered tests (e.g., lab work, imaging, nerve conduction tests) Performing post-procedure evaluations, including interpretation of diagnostic  procedures Obtaining and/or reviewing separately obtained history Performing a medically appropriate examination and/or evaluation Counseling and educating the  patient/family/caregiver Ordering medications, tests, or procedures Referring and communicating with other health care professionals (when not separately reported) Documenting clinical information in the electronic or other health record Independently interpreting results (not separately reported) and communicating results to the patient/ family/caregiver Care coordination (not separately reported)  Note by: Oswaldo Done, MD Date: 08/21/2022; Time: 6:45 PM

## 2022-08-21 ENCOUNTER — Encounter: Payer: Self-pay | Admitting: Pain Medicine

## 2022-08-21 ENCOUNTER — Ambulatory Visit: Payer: Medicare Other | Attending: Pain Medicine | Admitting: Pain Medicine

## 2022-08-21 VITALS — BP 105/40 | HR 95 | Temp 97.1°F | Resp 14 | Ht 66.0 in | Wt 191.0 lb

## 2022-08-21 DIAGNOSIS — M1711 Unilateral primary osteoarthritis, right knee: Secondary | ICD-10-CM

## 2022-08-21 DIAGNOSIS — M545 Low back pain, unspecified: Secondary | ICD-10-CM | POA: Insufficient documentation

## 2022-08-21 DIAGNOSIS — M461 Sacroiliitis, not elsewhere classified: Secondary | ICD-10-CM | POA: Diagnosis not present

## 2022-08-21 DIAGNOSIS — Z7901 Long term (current) use of anticoagulants: Secondary | ICD-10-CM | POA: Diagnosis not present

## 2022-08-21 DIAGNOSIS — M79604 Pain in right leg: Secondary | ICD-10-CM | POA: Insufficient documentation

## 2022-08-21 DIAGNOSIS — M170 Bilateral primary osteoarthritis of knee: Secondary | ICD-10-CM

## 2022-08-21 DIAGNOSIS — G8929 Other chronic pain: Secondary | ICD-10-CM | POA: Diagnosis not present

## 2022-08-21 DIAGNOSIS — M1712 Unilateral primary osteoarthritis, left knee: Secondary | ICD-10-CM

## 2022-08-21 DIAGNOSIS — T85192S Other mechanical complication of implanted electronic neurostimulator (electrode) of spinal cord, sequela: Secondary | ICD-10-CM | POA: Diagnosis not present

## 2022-08-21 DIAGNOSIS — M4316 Spondylolisthesis, lumbar region: Secondary | ICD-10-CM

## 2022-08-21 DIAGNOSIS — M4310 Spondylolisthesis, site unspecified: Secondary | ICD-10-CM

## 2022-08-21 DIAGNOSIS — M961 Postlaminectomy syndrome, not elsewhere classified: Secondary | ICD-10-CM

## 2022-08-21 DIAGNOSIS — M5459 Other low back pain: Secondary | ICD-10-CM

## 2022-08-21 DIAGNOSIS — M25561 Pain in right knee: Secondary | ICD-10-CM | POA: Insufficient documentation

## 2022-08-21 DIAGNOSIS — M25562 Pain in left knee: Secondary | ICD-10-CM | POA: Insufficient documentation

## 2022-08-21 NOTE — Patient Instructions (Addendum)
Facet Joint Block The facet joints connect the bones of the spine (vertebrae). They let you bend, twist, and make other movements with your spine. They also keep you from bending too far, twisting too far, and making other extreme movements. A facet joint block is a procedure where a numbing medicine (local anesthesia) is injected into a facet joint. Many times, a medicine for inflammation (steroid) is also injected. A facet joint block may be done: To diagnose neck or back pain. If the pain gets better after a facet joint block, the pain is likely coming from the facet joint. If the pain does not get better, the pain is likely not coming from the facet joint. To treat neck or back pain caused by an inflamed facet joint. To help you with physical therapy or other rehab (rehabilitation) exercises. Tell a health care provider about: Any allergies you have. All medicines you are taking, including vitamins, herbs, eye drops, creams, and over-the-counter medicines. Any problems you or family members have had with anesthesia. Any bleeding problems you have. Any surgeries you have had. Any medical conditions you have or have had. Whether you are pregnant or may be pregnant. What are the risks? Your health care provider will talk with you about risks. These may include: Infection. Allergic reactions to medicines or dyes. Bleeding. Injury to a nerve near where the needle was put in (injection site). Pain at the injection site. Short-term weakness or numbness in areas near the nerves at the injection site. What happens before the procedure? When to stop eating and drinking Follow instructions from your health care provider about what you may eat and drink. Medicines Ask your health care provider about: Changing or stopping your regular medicines. These include any diabetes medicines or blood thinners you take. Taking medicines such as aspirin and ibuprofen. These medicines can thin your blood. Do  not take these medicines unless your health care provider tells you to. Taking over-the-counter medicines, vitamins, herbs, and supplements. General instructions If you will be going home right after the procedure, plan to have a responsible adult: Take you home from the hospital or clinic. You will not be allowed to drive. Care for you for the time you are told. Ask your health care provider: How your injection site will be marked. What steps will be taken to help prevent infection. These may include washing skin with a soap that kills germs. What happens during the procedure?  An IV will be inserted into one of your veins. You will lie on your stomach on an X-ray table. You may be asked to lie in a different position if you will be getting an injection in your neck. Your injection site will be cleaned with a soap that kills germs and then covered with a germ-free (sterile) drape. A local anesthesia will be put in at the injection site. A type of X-ray machine (fluoroscopy) or CT scan will be used to help find your facet joint. A contrast dye may also be injected into your joint to help show if the needle is at the joint. When your provider knows the needle is at your joint, they will inject anesthesia and anti-inflammatory medicine as needed. The needle will be removed. Pressure will be applied to keep your injection site from bleeding. A bandage (dressing) will be placed over each injection site. The procedure may vary among health care providers and hospitals. What happens after the procedure? Your blood pressure, heart rate, breathing rate, and blood oxygen level  will be monitored until you leave the hospital or clinic. This information is not intended to replace advice given to you by your health care provider. Make sure you discuss any questions you have with your health care provider. Document Revised: 09/09/2021 Document Reviewed: 09/09/2021 Elsevier Patient Education  2024  [employer]  ____________________________________________________________________________________________  Blood Thinners  IMPORTANT NOTICE:  If you take any of these, make sure to notify the nursing staff.  Failure to do so may result in injury.  Recommended time intervals to stop and restart blood-thinners, before & after invasive procedures  Generic Name Brand Name Pre-procedure. Stop this long before procedure. Post-procedure. Minimum waiting period before restarting.  Abciximab Reopro 15 days 2 hrs  Alteplase Activase 10 days 10 days  Anagrelide Agrylin    Apixaban Eliquis 3 days 6 hrs  Cilostazol Pletal 3 days 5 hrs  Clopidogrel Plavix 7-10 days 2 hrs  Dabigatran Pradaxa 5 days 6 hrs  Dalteparin Fragmin 24 hours 4 hrs  Dipyridamole Aggrenox 11days 2 hrs  Edoxaban Lixiana; Savaysa 3 days 2 hrs  Enoxaparin  Lovenox 24 hours 4 hrs  Eptifibatide Integrillin 8 hours 2 hrs  Fondaparinux  Arixtra 72 hours 12 hrs  Hydroxychloroquine Plaquenil 11 days   Prasugrel Effient 7-10 days 6 hrs  Reteplase Retavase 10 days 10 days  Rivaroxaban Xarelto 3 days 6 hrs  Ticagrelor Brilinta 5-7 days 6 hrs  Ticlopidine Ticlid 10-14 days 2 hrs  Tinzaparin Innohep 24 hours 4 hrs  Tirofiban Aggrastat 8 hours 2 hrs  Warfarin Coumadin 5 days 2 hrs   Other medications with blood-thinning effects  Product indications Generic (Brand) names Note  Cholesterol Lipitor Stop 4 days before procedure  Blood thinner (injectable) Heparin (LMW or LMWH Heparin) Stop 24 hours before procedure  Cancer Ibrutinib (Imbruvica) Stop 7 days before procedure  Malaria/Rheumatoid Hydroxychloroquine (Plaquenil) Stop 11 days before procedure  Thrombolytics  10 days before or after procedures   Over-the-counter (OTC) Products with blood-thinning effects  Product Common names Stop Time  Aspirin > 325 mg Goody Powders, Excedrin, etc. 11 days  Aspirin ? 81 mg  7 days  Fish oil  4 days  Garlic supplements  7 days   Ginkgo biloba  36 hours  Ginseng  24 hours  NSAIDs Ibuprofen, Naprosyn, etc. 3 days  Vitamin E  4 days   ____________________________________________________________________________________________    ______________________________________________________________________  Procedure instructions  Do not eat or drink fluids (other than water) for 6 hours before your procedure  No water for 2 hours before your procedure  Take your blood pressure medicine with a sip of water  Arrive 30 minutes before your appointment  Carefully read the "Preparing for your procedure" detailed instructions  If you have questions call us at [phone]  _____________________________________________________________________    ______________________________________________________________________  Preparing for your procedure  Appointments: If you think you may not be able to keep your appointment, call 24-48 hours in advance to cancel. We need time to make it available to others.  During your procedure appointment there will be: No Prescription Refills. No disability issues to discussed. No medication changes or discussions.  Instructions: Food intake: Avoid eating anything solid for at least 8 hours prior to your procedure. Clear liquid intake: You may take clear liquids such as water up to 2 hours prior to your procedure. (No carbonated drinks. No soda.) Transportation: Unless otherwise stated by your physician, bring a driver. Morning Medicines: Except for blood thinners, take all of your other morning medications  with a sip of water. Make sure to take your heart and blood pressure medicines. If your blood pressure's lower number is above 100, the case will be rescheduled. Blood thinners: Make sure to stop your blood thinners as instructed.  If you take a blood thinner, but were not instructed to stop it, call our office [phone] and ask to talk to a nurse. Not stopping a blood  thinner prior to certain procedures could lead to serious complications. Diabetics on insulin: Notify the staff so that you can be scheduled 1st case in the morning. If your diabetes requires high dose insulin, take only  of your normal insulin dose the morning of the procedure and notify the staff that you have done so. Preventing infections: Shower with an antibacterial soap the morning of your procedure.  Build-up your immune system: Take 1000 mg of Vitamin C with every meal (3 times a day) the day prior to your procedure. Antibiotics: Inform the nursing staff if you are taking any antibiotics or if you have any conditions that may require antibiotics prior to procedures. (Example: recent joint implants)   Pregnancy: If you are pregnant make sure to notify the nursing staff. Not doing so may result in injury to the fetus, including death.  Sickness: If you have a cold, fever, or any active infections, call and cancel or reschedule your procedure. Receiving steroids while having an infection may result in complications. Arrival: You must be in the facility at least 30 minutes prior to your scheduled procedure. Tardiness: Your scheduled time is also the cutoff time. If you do not arrive at least 15 minutes prior to your procedure, you will be rescheduled.  Children: Do not bring any children with you. Make arrangements to keep them home. Dress appropriately: There is always a possibility that your clothing may get soiled. Avoid long dresses. Valuables: Do not bring any jewelry or valuables.  Reasons to call and reschedule or cancel your procedure: (Following these recommendations will minimize the risk of a serious complication.) Surgeries: Avoid having procedures within 2 weeks of any surgery. (Avoid for 2 weeks before or after any surgery). Flu Shots: Avoid having procedures within 2 weeks of a flu shots or . (Avoid for 2 weeks before or after immunizations). Barium: Avoid having a procedure  within 7-10 days after having had a radiological study involving the use of radiological contrast. (Myelograms, Barium swallow or enema study). Heart attacks: Avoid any elective procedures or surgeries for the initial 6 months after a "Myocardial Infarction" (Heart Attack). Blood thinners: It is imperative that you stop these medications before procedures. Let us know if you if you take any blood thinner.  Infection: Avoid procedures during or within two weeks of an infection (including chest colds or gastrointestinal problems). Symptoms associated with infections include: Localized redness, fever, chills, night sweats or profuse sweating, burning sensation when voiding, cough, congestion, stuffiness, runny nose, sore throat, diarrhea, nausea, vomiting, cold or Flu symptoms, recent or current infections. It is specially important if the infection is over the area that we intend to treat. Heart and lung problems: Symptoms that may suggest an active cardiopulmonary problem include: cough, chest pain, breathing difficulties or shortness of breath, dizziness, ankle swelling, uncontrolled high or unusually low blood pressure, and/or palpitations. If you are experiencing any of these symptoms, cancel your procedure and contact your primary care physician for an evaluation.  Remember:  Regular Business hours are:  Monday to Thursday 8:00 AM to 4:00 PM  Provider's  Schedule: Delano Metz, MD:  Procedure days: Tuesday and Thursday 7:30 AM to 4:00 PM  Edward Jolly, MD:  Procedure days: Monday and Wednesday 7:30 AM to 4:00 PM  ______________________________________________________________________    ____________________________________________________________________________________________  General Risks and Possible Complications  Patient Responsibilities: It is important that you read this as it is part of your informed consent. It is our duty to inform you of the risks and possible complications  associated with treatments offered to you. It is your responsibility as a patient to read this and to ask questions about anything that is not clear or that you believe was not covered in this document.  Patient's Rights: You have the right to refuse treatment. You also have the right to change your mind, even after initially having agreed to have the treatment done. However, under this last option, if you wait until the last second to change your mind, you may be charged for the materials used up to that point.  Introduction: Medicine is not an Visual merchandiser. Everything in Medicine, including the lack of treatment(s), carries the potential for danger, harm, or loss (which is by definition: Risk). In Medicine, a complication is a secondary problem, condition, or disease that can aggravate an already existing one. All treatments carry the risk of possible complications. The fact that a side effects or complications occurs, does not imply that the treatment was conducted incorrectly. It must be clearly understood that these can happen even when everything is done following the highest safety standards.  No treatment: You can choose not to proceed with the proposed treatment alternative. The "PRO(s)" would include: avoiding the risk of complications associated with the therapy. The "CON(s)" would include: not getting any of the treatment benefits. These benefits fall under one of three categories: diagnostic; therapeutic; and/or palliative. Diagnostic benefits include: getting information which can ultimately lead to improvement of the disease or symptom(s). Therapeutic benefits are those associated with the successful treatment of the disease. Finally, palliative benefits are those related to the decrease of the primary symptoms, without necessarily curing the condition (example: decreasing the pain from a flare-up of a chronic condition, such as incurable terminal cancer).  General Risks and Complications:  These are associated to most interventional treatments. They can occur alone, or in combination. They fall under one of the following six (6) categories: no benefit or worsening of symptoms; bleeding; infection; nerve damage; allergic reactions; and/or death. No benefits or worsening of symptoms: In Medicine there are no guarantees, only probabilities. No healthcare provider can ever guarantee that a medical treatment will work, they can only state the probability that it may. Furthermore, there is always the possibility that the condition may worsen, either directly, or indirectly, as a consequence of the treatment. Bleeding: This is more common if the patient is taking a blood thinner, either prescription or over the counter (example: Goody Powders, Fish oil, Aspirin, Garlic, etc.), or if suffering a condition associated with impaired coagulation (example: Hemophilia, cirrhosis of the liver, low platelet counts, etc.). However, even if you do not have one on these, it can still happen. If you have any of these conditions, or take one of these drugs, make sure to notify your treating physician. Infection: This is more common in patients with a compromised immune system, either due to disease (example: diabetes, cancer, human immunodeficiency virus [HIV], etc.), or due to medications or treatments (example: therapies used to treat cancer and rheumatological diseases). However, even if you do not have one on these,  it can still happen. If you have any of these conditions, or take one of these drugs, make sure to notify your treating physician. Nerve Damage: This is more common when the treatment is an invasive one, but it can also happen with the use of medications, such as those used in the treatment of cancer. The damage can occur to small secondary nerves, or to large primary ones, such as those in the spinal cord and brain. This damage may be temporary or permanent and it may lead to impairments that can  range from temporary numbness to permanent paralysis and/or brain death. Allergic Reactions: Any time a substance or material comes in contact with our body, there is the possibility of an allergic reaction. These can range from a mild skin rash (contact dermatitis) to a severe systemic reaction (anaphylactic reaction), which can result in death. Death: In general, any medical intervention can result in death, most of the time due to an unforeseen complication. ____________________________________________________________________________________________   TENS (Device can be purchased "online", without prescription. Search: "TENS 7000".) Transcutaneous electrical nerve stimulation (TENS) is a method of pain relief involving the use of a mild electrical current. A TENS machine is a small, battery-operated device that has leads connected to sticky pads called electrodes.   Rechargeable 9V batteries:     Electrode placement:   TENS UNIT SAFETY WARNING SHEET and INFORMATION INDICATIONS AND CONTRAINDICTIONS Read the operation manual before using the device. Freight forwarder ([place]) restricts this device to sale by or on the order of a physician. Observe your physician's precise instructions and let him show you where to apply the electrodes. For a successful therapy, the correct application of the electrodes is an important factor. Carefully write down the settings your physician recommended. Indications for use This device is a prescription device and only for symptomatic relief of chronic intractable pain. Contraindications:   Any electrode placement that applies current to the carotid sinus (neck) region.   Patients with implanted electronic devices (for example, a pacemaker) or metallic implants should not undertake.   Any electrode placement that causes current to flow transcerebrally (through the head). The use of unit whenever pain symptoms are undiagnosed, unit etiology is determined.   The use of TENS  whenever pain syndromes are undiagnosed, until etiology is established.  WARNINGS AND PRECAUTIONS  Warnings:   The device must be kept out of reach of children.   The safety of device for use during pregnancy or delivery has not been established.   Do not place electrodes on front of the throat. This may result in spasms of the laryngeal and pharyngeal muscles.   Do not place the electrodes over the carotid nerve (side of neck below ear).   The device is not effective for pain of central origin (headaches).   The device may interfere with electronic monitoring equipment (such as ECG monitors and ECG alarms).   Electrodes should not be placed over the eyes, in the mouth, or internally.   These devices have no curative value.   TENS devices should be used only under the continued supervision of a physician.   TENS is a symptomatic treatment and as such suppresses the sensation of pain which would otherwise serve as a protective mechanism. Precautions/Adverse Reactions   Isolated cases of skin irritation may occur at the site of electrode placement following long-term application.   Stimulation should be stopped and electrodes removed until the cause of the irritation can be determined.   Effectiveness is highly  dependent upon patient selection by a person qualified in the management of pain patients.   If the device treatment becomes ineffective or unpleasant, stimulation should be discontinued until reevaluation by a physician/clinician.   Always turn the device off before applying or removing electrodes.   Skin irritation and electrode burns are potential adverse reactions.  PURPOSE: A Transcutaneous Electrical Nerve Stimulator, or TENS, unit is designed to relieve post-operative, acute and chronic pain. It is used for pain caused by peripheral nerves and not central. TENS units are prescription-only devices.  OPERATION: TENS units work in a couple of ways. The first way they are thought to work is by a method  called the [employer]. The [employer] states that our brains can only handle one stimulus at a time. When you have chronic pain, this pain signal is constantly being sent to your brain and recognized as pain. When an electrical stimulus is added to the area of pain the body feels this electrical stimulus, and since the brain can only handle one thing at a time, the pain is not transmitted to the brain. The second method thought to be part of TENS unit's success is by way of stimulating our own bodies to release their own natural painkillers. TENS units do not work for everyone and results may vary. Always follow the instructions and warnings in your user's manual.  USE: One of the most important tasks that must be performed is battery maintenance. If you are using a Engineering geologist, always fully charge it and fully deplete it before charging it again. These batteries can develop memories and by not performing this charging task correctly, your battery's life can be greatly diminished. If your battery does develop a memory you can help expand the memory by charging for 12 - 13 hours and then completely depleting the battery. Always prepare the skin before applying electrodes. Your skin should be clean and free of any lotions or creams. If you are using electrodes that use conductive gel, apply a small, even layer over the electrode. For carbon, self-adhesive electrodes, apply a drop of water to the electrodes before applying to the skin. The electrodes attach to the lead wires and then the TENS unit. Always grasp the connector and not the cord when inserting or removing. When making adjustments, always make sure the unit's channels (1 and 2) are in the OFF position. The actual settings should be recommended and prescribed by your physician. Medical equipment suppliers don'tset or instruct users as to user settings. When you are using the BURST mode, the unit delivers a series of quick pulses  followed by a rest. This cycle repeats itself frequently. Always have channels OFF before changing modes.  For MODULATION mode, the stimulation automatically varies the width of the pulse.  For CONVENTIONAL mode, the stimulation is constant. After the settings have been fine-tuned, set the timer to 30 or 60 minutes. Your physician should also prescribe the use time. When the lights become dim, it means your batteries should be replaced or recharged.  ACCESSORIES: The electrodes and lead wires can be obtained from your medical equipment supplier. Your medical equipment supplier can set up a recurring delivery to accommodate your needs. Electrodes should be replaced once a month and lead wires once every 6 months.  Video Tutorial https://youtu.be/V_quvXRrlQE?[email or web address]

## 2022-08-25 ENCOUNTER — Telehealth: Payer: Self-pay | Admitting: Family Medicine

## 2022-08-25 DIAGNOSIS — E1151 Type 2 diabetes mellitus with diabetic peripheral angiopathy without gangrene: Secondary | ICD-10-CM

## 2022-08-25 NOTE — Telephone Encounter (Unsigned)
Copied from CRM [id number]. Topic: General - Other >> Aug 25, 2022  2:01 PM Franchot Heidelberg wrote: Reason for CRM: Pt called to report that Delorise Shiner from [facility] instructed her to contact her PCP and request new diabetic testing supplies for the Justin 2. Please advise  [phone]

## 2022-08-28 ENCOUNTER — Telehealth: Payer: Self-pay | Admitting: Family Medicine

## 2022-08-28 MED ORDER — FREESTYLE LIBRE 2 SENSOR MISC
1.0000 | 3 refills | Status: DC
Start: 2022-08-28 — End: 2023-07-12

## 2022-08-28 MED ORDER — FREESTYLE LIBRE 2 READER DEVI
12 refills | Status: DC
Start: 2022-08-28 — End: 2023-07-12

## 2022-08-28 NOTE — Telephone Encounter (Signed)
Spoke with patient and informed her that the prescription has already been sent to her local pharmacy [facility]. Patient was advised to reach out to their pharmacy to have the orders transferred to the [facility]. Patient verbalized understanding and has no further questions.

## 2022-08-28 NOTE — Telephone Encounter (Signed)
No need for appt if you write out order I'll sign

## 2022-08-28 NOTE — Telephone Encounter (Unsigned)
Copied from CRM [id number]. Topic: General - Other >> Aug 28, 2022  2:30 PM Tammy [name] wrote: Reason for CRM: The patient has called to request orders for a Freestyle Libre 2 testing kit and supplies sent to [facility]   Please contact further when possible

## 2022-09-04 ENCOUNTER — Other Ambulatory Visit: Payer: Self-pay | Admitting: Nurse Practitioner

## 2022-09-04 ENCOUNTER — Ambulatory Visit (INDEPENDENT_AMBULATORY_CARE_PROVIDER_SITE_OTHER): Payer: Medicare Other | Admitting: Family Medicine

## 2022-09-04 ENCOUNTER — Telehealth: Payer: Self-pay | Admitting: Family Medicine

## 2022-09-04 ENCOUNTER — Other Ambulatory Visit: Payer: Self-pay | Admitting: Family Medicine

## 2022-09-04 VITALS — BP 104/67 | HR 67 | Temp 98.6°F | Wt 188.0 lb

## 2022-09-04 DIAGNOSIS — E039 Hypothyroidism, unspecified: Secondary | ICD-10-CM

## 2022-09-04 DIAGNOSIS — I10 Essential (primary) hypertension: Secondary | ICD-10-CM | POA: Diagnosis not present

## 2022-09-04 DIAGNOSIS — E1122 Type 2 diabetes mellitus with diabetic chronic kidney disease: Secondary | ICD-10-CM

## 2022-09-04 DIAGNOSIS — E1151 Type 2 diabetes mellitus with diabetic peripheral angiopathy without gangrene: Secondary | ICD-10-CM | POA: Diagnosis not present

## 2022-09-04 DIAGNOSIS — J449 Chronic obstructive pulmonary disease, unspecified: Secondary | ICD-10-CM | POA: Diagnosis not present

## 2022-09-04 DIAGNOSIS — E782 Mixed hyperlipidemia: Secondary | ICD-10-CM

## 2022-09-04 DIAGNOSIS — N183 Chronic kidney disease, stage 3 unspecified: Secondary | ICD-10-CM | POA: Diagnosis not present

## 2022-09-04 DIAGNOSIS — E559 Vitamin D deficiency, unspecified: Secondary | ICD-10-CM

## 2022-09-04 DIAGNOSIS — Z794 Long term (current) use of insulin: Secondary | ICD-10-CM | POA: Diagnosis not present

## 2022-09-04 DIAGNOSIS — N1832 Chronic kidney disease, stage 3b: Secondary | ICD-10-CM | POA: Diagnosis not present

## 2022-09-04 MED ORDER — ISOSORBIDE MONONITRATE ER 60 MG PO TB24: 60.0000 mg | ORAL_TABLET | Freq: Every day | ORAL | 1 refills | Status: DC

## 2022-09-04 MED ORDER — METOPROLOL TARTRATE 25 MG PO TABS: 25.0000 mg | ORAL_TABLET | Freq: Two times a day (BID) | ORAL | 1 refills | Status: DC

## 2022-09-04 MED ORDER — ROSUVASTATIN CALCIUM 40 MG PO TABS: 40.0000 mg | ORAL_TABLET | Freq: Every day | ORAL | 1 refills | Status: DC

## 2022-09-04 MED ORDER — GLIPIZIDE 5 MG PO TABS: ORAL_TABLET | ORAL | 1 refills | Status: DC

## 2022-09-04 MED ORDER — MONTELUKAST SODIUM 10 MG PO TABS: 10.0000 mg | ORAL_TABLET | Freq: Every day | ORAL | 0 refills | Status: DC

## 2022-09-04 MED ORDER — DULOXETINE HCL 60 MG PO CPEP: 120.0000 mg | ORAL_CAPSULE | Freq: Every day | ORAL | 1 refills | Status: DC

## 2022-09-04 MED ORDER — ALBUTEROL SULFATE HFA 108 (90 BASE) MCG/ACT IN AERS: 2.0000 | INHALATION_SPRAY | Freq: Four times a day (QID) | RESPIRATORY_TRACT | 3 refills | Status: AC | PRN

## 2022-09-04 MED ORDER — APIXABAN 2.5 MG PO TABS: 2.5000 mg | ORAL_TABLET | Freq: Two times a day (BID) | ORAL | 1 refills | Status: DC

## 2022-09-04 MED ORDER — PANTOPRAZOLE SODIUM 40 MG PO TBEC: 40.0000 mg | DELAYED_RELEASE_TABLET | Freq: Every day | ORAL | 1 refills | Status: DC

## 2022-09-04 MED ORDER — TIRZEPATIDE 12.5 MG/0.5ML ~~LOC~~ SOAJ
12.5000 mg | SUBCUTANEOUS | 1 refills | Status: DC
  Filled 2022-09-27: qty 2, 28d supply, fill #0
  Filled 2022-10-22: qty 2, 28d supply, fill #1
  Filled 2022-11-24: qty 2, 28d supply, fill #2
  Filled 2022-12-19: qty 2, 28d supply, fill #3
  Filled 2023-01-17: qty 2, 28d supply, fill #4
  Filled 2023-02-20: qty 2, 28d supply, fill #5

## 2022-09-04 NOTE — Assessment & Plan Note (Signed)
Feeling well. Rechecking labs today. Await results. Treat as needed.  

## 2022-09-04 NOTE — Assessment & Plan Note (Signed)
Rechecking labs today. Await results. Treat as needed.  °

## 2022-09-04 NOTE — Assessment & Plan Note (Signed)
Under good control on current regimen. Continue current regimen. Continue to monitor. Call with any concerns. Refills given. Labs drawn today.   

## 2022-09-04 NOTE — Telephone Encounter (Signed)
Attempted to reach pharmacy and its currently closed will try again tomorrow.

## 2022-09-04 NOTE — Assessment & Plan Note (Signed)
Still running a little low, but feeling well. Will continue current regimen. Continue to monitor. Call with any concerns. Refills given today. Labs drawn today.

## 2022-09-04 NOTE — Telephone Encounter (Signed)
Needs sensors for freestyle libre sent to '[facility]" we do not have their information- please get it so we can send sensors and recievers see message from 08/25/22

## 2022-09-04 NOTE — Progress Notes (Signed)
BP 104/67   Pulse 67   Temp 98.6 F (37 C) (Oral)   Wt 188 lb (85.3 kg)   SpO2 94%   BMI 30.34 kg/m    Subjective:    Patient ID: Tammy [name], female    DOB: 08-09-1945, 77 y.o.   MRN: [id number]  HPI: Tammy [name] is a 77 y.o. female  Chief Complaint  Patient presents with   Hypertension   Hyperlipidemia   Diabetes   Hypothyroidism   DIABETES Hypoglycemic episodes:no Polydipsia/polyuria: no Visual disturbance: no Chest pain: no Paresthesias: no Glucose Monitoring: yes  Accucheck frequency: continuous Taking Insulin?: no Blood Pressure Monitoring: rarely Retinal Examination: Not up to Date Foot Exam: Up to Date Diabetic Education: Completed Pneumovax: Up to Date Influenza: Up to Date Aspirin: no  HYPERTENSION / HYPERLIPIDEMIA Satisfied with current treatment? yes Duration of hypertension: chronic BP monitoring frequency: not checking BP medication side effects: no Past BP meds: imdur and metoprolol Duration of hyperlipidemia: chronic Cholesterol medication side effects: no Cholesterol supplements: none Past cholesterol medications: crestor Medication compliance: excellent compliance Aspirin: no Recent stressors: no Recurrent headaches: no Visual changes: no Palpitations: no Dyspnea: no Chest pain: no Lower extremity edema: no Dizzy/lightheaded: no  HYPOTHYROIDISM Thyroid control status:controlled Satisfied with current treatment? yes Medication side effects: no Medication compliance: excellent compliance Recent dose adjustment:no Fatigue: no Cold intolerance: no Heat intolerance: no Weight gain: no Weight loss: no Constipation: no Diarrhea/loose stools: no Palpitations: no Lower extremity edema: no Anxiety/depressed mood: no   Relevant past medical, surgical, family and social history reviewed and updated as indicated. Interim medical history since our last visit reviewed. Allergies and medications reviewed and updated.  Review of  Systems  Constitutional: Negative.   Respiratory: Negative.    Cardiovascular: Negative.   Gastrointestinal: Negative.   Musculoskeletal:  Positive for back pain and myalgias. Negative for arthralgias, gait problem, joint swelling, neck pain and neck stiffness.  Skin: Negative.   Neurological: Negative.   Psychiatric/Behavioral: Negative.      Per HPI unless specifically indicated above     Objective:    BP 104/67   Pulse 67   Temp 98.6 F (37 C) (Oral)   Wt 188 lb (85.3 kg)   SpO2 94%   BMI 30.34 kg/m   Wt Readings from Last 3 Encounters:  09/04/22 188 lb (85.3 kg)  08/21/22 191 lb (86.6 kg)  08/01/22 192 lb 4.8 oz (87.2 kg)    Physical Exam Vitals and nursing note reviewed.  Constitutional:      General: She is not in acute distress.    Appearance: Normal appearance. She is not ill-appearing, toxic-appearing or diaphoretic.  HENT:     Head: Normocephalic and atraumatic.     Right Ear: External ear normal.     Left Ear: External ear normal.     [name]: [name] normal.     Mouth/Throat:     Mouth: Mucous membranes are moist.     Pharynx: Oropharynx is clear.  Eyes:     General: No scleral icterus.       Right eye: No discharge.        Left eye: No discharge.     Extraocular Movements: Extraocular movements intact.     Conjunctiva/sclera: Conjunctivae normal.     Pupils: Pupils are equal, round, and reactive to light.  Cardiovascular:     Rate and Rhythm: Normal rate and regular rhythm.     Pulses: Normal pulses.     Heart sounds:  Normal heart sounds. No murmur heard.    No friction rub. No gallop.  Pulmonary:     Effort: Pulmonary effort is normal. No respiratory distress.     Breath sounds: Normal breath sounds. No stridor. No wheezing, rhonchi or rales.  Chest:     Chest wall: No tenderness.  Musculoskeletal:        General: Normal range of motion.     Cervical back: Normal range of motion and neck supple.  Skin:    General: Skin is warm and dry.      Capillary Refill: Capillary refill takes less than 2 seconds.     Coloration: Skin is not jaundiced or pale.     Findings: No bruising, erythema, lesion or rash.  Neurological:     General: No focal deficit present.     Mental Status: She is alert and oriented to [name], place, and time. Mental status is at baseline.  Psychiatric:        Mood and Affect: Mood normal.        Behavior: Behavior normal.        Thought Content: Thought content normal.        Judgment: Judgment normal.     Results for orders placed or performed in visit on 08/01/22  Basic metabolic panel  Result Value Ref Range   Sodium 135 135 - 145 mmol/L   Potassium 4.6 3.5 - 5.1 mmol/L   Chloride 103 98 - 111 mmol/L   CO2 20 (L) 22 - 32 mmol/L   Glucose, Bld 88 70 - 99 mg/dL   BUN 31 (H) 8 - 23 mg/dL   Creatinine, Ser 1.61 (H) 0.44 - 1.00 mg/dL   Calcium 9.2 8.9 - 09.6 mg/dL   GFR, Estimated 37 (L) >60 mL/min   Anion gap 12 5 - 15  CBC with Differential/Platelet  Result Value Ref Range   WBC 9.3 4.0 - 10.5 K/uL   RBC 5.10 3.87 - 5.11 MIL/uL   Hemoglobin 14.1 12.0 - 15.0 g/dL   HCT 04.5 (H) 40.9 - 81.1 %   MCV 90.4 80.0 - 100.0 fL   MCH 27.6 26.0 - 34.0 pg   MCHC 30.6 30.0 - 36.0 g/dL   RDW 91.4 78.2 - 95.6 %   Platelets 282 150 - 400 K/uL   nRBC 0.0 0.0 - 0.2 %   Neutrophils Relative % 60 %   Neutro Abs 5.6 1.7 - 7.7 K/uL   Lymphocytes Relative 25 %   Lymphs Abs 2.3 0.7 - 4.0 K/uL   Monocytes Relative 11 %   Monocytes Absolute 1.0 0.1 - 1.0 K/uL   Eosinophils Relative 2 %   Eosinophils Absolute 0.2 0.0 - 0.5 K/uL   Basophils Relative 1 %   Basophils Absolute 0.1 0.0 - 0.1 K/uL   Immature Granulocytes 1 %   Abs Immature Granulocytes 0.07 0.00 - 0.07 K/uL   *Note: Due to a large number of results and/or encounters for the requested time period, some results have not been displayed. A complete set of results can be found in Results Review.      Assessment & Plan:   Problem List Items Addressed  This Visit       Cardiovascular and Mediastinum   Essential hypertension - Primary (Chronic)    Still running a little low, but feeling well. Will continue current regimen. Continue to monitor. Call with any concerns. Refills given today. Labs drawn today.       Relevant Medications  apixaban (ELIQUIS) 2.5 MG TABS tablet   isosorbide mononitrate (IMDUR) 60 MG 24 hr tablet   metoprolol tartrate (LOPRESSOR) 25 MG tablet   rosuvastatin (CRESTOR) 40 MG tablet   Other Relevant Orders   Comprehensive metabolic panel   CBC with Differential/Platelet   Type 2 diabetes mellitus with diabetic peripheral angiopathy without gangrene (HCC) (Chronic)    Feeling well. Rechecking labs today. Await results. Treat as needed.       Relevant Medications   apixaban (ELIQUIS) 2.5 MG TABS tablet   glipiZIDE (GLUCOTROL) 5 MG tablet   isosorbide mononitrate (IMDUR) 60 MG 24 hr tablet   metoprolol tartrate (LOPRESSOR) 25 MG tablet   rosuvastatin (CRESTOR) 40 MG tablet   tirzepatide (MOUNJARO) 12.5 MG/0.5ML Pen   Other Relevant Orders   Comprehensive metabolic panel   CBC with Differential/Platelet     Respiratory   COPD (chronic obstructive pulmonary disease) (HCC) (Chronic)    Under good control on current regimen. Continue current regimen. Continue to monitor. Call with any concerns. Refills given. Labs drawn today.       Relevant Medications   albuterol (VENTOLIN HFA) 108 (90 Base) MCG/ACT inhaler   montelukast (SINGULAIR) 10 MG tablet   Other Relevant Orders   Comprehensive metabolic panel   CBC with Differential/Platelet     Endocrine   Diabetes mellitus, type II (HCC) (Chronic)    Feeling well. Rechecking labs today. Await results. Treat as needed.       Relevant Medications   glipiZIDE (GLUCOTROL) 5 MG tablet   rosuvastatin (CRESTOR) 40 MG tablet   tirzepatide (MOUNJARO) 12.5 MG/0.5ML Pen   Other Relevant Orders   Comprehensive metabolic panel   CBC with Differential/Platelet    Comprehensive metabolic panel   CBC with Differential/Platelet   Hgb A1c w/o eAG   Comprehensive metabolic panel   CBC with Differential/Platelet   Type 2 diabetes mellitus with diabetic chronic kidney disease (HCC) (Chronic)    Feeling well. Rechecking labs today. Await results. Treat as needed.       Relevant Medications   glipiZIDE (GLUCOTROL) 5 MG tablet   rosuvastatin (CRESTOR) 40 MG tablet   tirzepatide (MOUNJARO) 12.5 MG/0.5ML Pen   Other Relevant Orders   Comprehensive metabolic panel   CBC with Differential/Platelet   Hgb A1c w/o eAG   Comprehensive metabolic panel   CBC with Differential/Platelet   Hypothyroid    Rechecking labs today. Await results. Treat as needed.       Relevant Medications   metoprolol tartrate (LOPRESSOR) 25 MG tablet   Other Relevant Orders   Comprehensive metabolic panel   CBC with Differential/Platelet   TSH     Genitourinary   Stage 3b chronic kidney disease (HCC) (Chronic)    Rechecking labs today. Await results. Treat as needed.       Relevant Orders   Comprehensive metabolic panel   CBC with Differential/Platelet     Other   Hyperlipidemia    Under good control on current regimen. Continue current regimen. Continue to monitor. Call with any concerns. Refills given. Labs drawn today.        Relevant Medications   apixaban (ELIQUIS) 2.5 MG TABS tablet   isosorbide mononitrate (IMDUR) 60 MG 24 hr tablet   metoprolol tartrate (LOPRESSOR) 25 MG tablet   rosuvastatin (CRESTOR) 40 MG tablet   Other Relevant Orders   Comprehensive metabolic panel   CBC with Differential/Platelet   Lipid Panel w/o Chol/HDL Ratio   Vitamin D deficiency    Rechecking  labs today. Await results. Treat as needed.       Relevant Orders   VITAMIN D 25 Hydroxy (Vit-D Deficiency, Fractures)     Follow up plan: Return in about 3 months (around 12/05/2022).

## 2022-09-05 LAB — COMPREHENSIVE METABOLIC PANEL
ALT: 17 IU/L (ref 0–32)
AST: 20 IU/L (ref 0–40)
Albumin: 4.3 g/dL (ref 3.8–4.8)
Alkaline Phosphatase: 89 IU/L (ref 44–121)
BUN/Creatinine Ratio: 22 (ref 12–28)
BUN: 33 mg/dL — ABNORMAL HIGH (ref 8–27)
Bilirubin Total: 0.5 mg/dL (ref 0.0–1.2)
CO2: 19 mmol/L — ABNORMAL LOW (ref 20–29)
Calcium: 9.7 mg/dL (ref 8.7–10.3)
Chloride: 103 mmol/L (ref 96–106)
Creatinine, Ser: 1.52 mg/dL — ABNORMAL HIGH (ref 0.57–1.00)
Globulin, Total: 2.6 g/dL (ref 1.5–4.5)
Glucose: 69 mg/dL — ABNORMAL LOW (ref 70–99)
Potassium: 5.1 mmol/L (ref 3.5–5.2)
Sodium: 139 mmol/L (ref 134–144)
Total Protein: 6.9 g/dL (ref 6.0–8.5)
eGFR: 35 mL/min/{1.73_m2} — ABNORMAL LOW (ref >59)

## 2022-09-05 LAB — LIPID PANEL W/O CHOL/HDL RATIO
Cholesterol, Total: 128 mg/dL (ref 100–199)
HDL: 43 mg/dL (ref >39)
LDL Chol Calc (NIH): 55 mg/dL (ref 0–99)
Triglycerides: 180 mg/dL — ABNORMAL HIGH (ref 0–149)
VLDL Cholesterol Cal: 30 mg/dL (ref 5–40)

## 2022-09-05 LAB — HGB A1C W/O EAG: Hgb A1c MFr Bld: 6.5 % — ABNORMAL HIGH (ref 4.8–5.6)

## 2022-09-05 LAB — CBC WITH DIFFERENTIAL/PLATELET
Basophils Absolute: 0.1 10*3/uL (ref 0.0–0.2)
Basos: 1 %
EOS (ABSOLUTE): 0.6 10*3/uL — ABNORMAL HIGH (ref 0.0–0.4)
Eos: 6 %
Hematocrit: 43 % (ref 34.0–46.6)
Hemoglobin: 14 g/dL (ref 11.1–15.9)
Immature Grans (Abs): 0.1 10*3/uL (ref 0.0–0.1)
Immature Granulocytes: 1 %
Lymphocytes Absolute: 2.5 10*3/uL (ref 0.7–3.1)
Lymphs: 26 %
MCH: 28.1 pg (ref 26.6–33.0)
MCHC: 32.6 g/dL (ref 31.5–35.7)
MCV: 86 fL (ref 79–97)
Monocytes Absolute: 0.9 10*3/uL (ref 0.1–0.9)
Monocytes: 9 %
Neutrophils Absolute: 5.6 10*3/uL (ref 1.4–7.0)
Neutrophils: 57 %
Platelets: 296 10*3/uL (ref 150–450)
RBC: 4.98 x10E6/uL (ref 3.77–5.28)
RDW: 13.5 % (ref 11.7–15.4)
WBC: 9.7 10*3/uL (ref 3.4–10.8)

## 2022-09-05 LAB — VITAMIN D 25 HYDROXY (VIT D DEFICIENCY, FRACTURES): Vit D, 25-Hydroxy: 42 ng/mL (ref 30.0–100.0)

## 2022-09-05 LAB — TSH: TSH: 0.014 u[IU]/mL — ABNORMAL LOW (ref 0.450–4.500)

## 2022-09-05 NOTE — Telephone Encounter (Signed)
Requested medication (s) are due for refill today: yes  Requested medication (s) are on the active medication list: yes  Last refill:  06/01/22  Future visit scheduled: yes  Notes to clinic:  Medication not assigned to a protocol, review manually      Requested Prescriptions  Pending Prescriptions Disp Refills   trimethoprim (TRIMPEX) 100 MG tablet [Pharmacy Med Name: TRIMETHOPRIM 100 MG TAB] 90 tablet 1    Sig: TAKE ONE TABLET BY MOUTH EVERY DAY     Off-Protocol Failed - 09/04/2022 12:26 PM      Failed - Medication not assigned to a protocol, review manually.      Passed - Valid encounter within last 12 months    Recent Outpatient Visits           Yesterday Essential hypertension   [facility] [place], [place] P, DO   4 weeks ago Chronic midline back pain, unspecified back location   [place] [facility], Megan P, DO   2 months ago Type 2 diabetes mellitus with diabetic peripheral angiopathy without gangrene, without long-term current use of insulin (HCC)   [place] [facility] [place], Megan P, DO   3 months ago Bartholin gland cyst   [place] [facility] [place], Megan P, DO   4 months ago Uncontrolled type 2 diabetes mellitus with hyperglycemia, with long-term current use of insulin [facility] [place])   [place] [facility] [place], Oralia Rud, DO       Future Appointments             In 3 months Laural Benes, Oralia Rud, DO [place] [facility]

## 2022-09-08 ENCOUNTER — Ambulatory Visit: Payer: Medicare Other | Admitting: Family Medicine

## 2022-09-10 ENCOUNTER — Other Ambulatory Visit: Payer: Self-pay | Admitting: Family Medicine

## 2022-09-10 DIAGNOSIS — E039 Hypothyroidism, unspecified: Secondary | ICD-10-CM

## 2022-09-10 MED ORDER — LEVOTHYROXINE SODIUM 125 MCG PO TABS
125.0000 ug | ORAL_TABLET | Freq: Every day | ORAL | 1 refills | Status: DC
Start: 2022-09-10 — End: 2022-11-22

## 2022-09-12 NOTE — Telephone Encounter (Signed)
Called and spoke with pt. Pt states that she got everythong taken care of and was able to get her freestyle libre for a 90 day supply.

## 2022-09-19 ENCOUNTER — Encounter: Payer: Self-pay | Admitting: Pain Medicine

## 2022-09-19 ENCOUNTER — Ambulatory Visit: Payer: Medicare Other | Attending: Pain Medicine | Admitting: Pain Medicine

## 2022-09-19 ENCOUNTER — Ambulatory Visit
Admission: RE | Admit: 2022-09-19 | Discharge: 2022-09-19 | Disposition: A | Discharge status: Home / Self Care | Payer: Medicare Other | Source: Ambulatory Visit | Attending: Pain Medicine | Admitting: Pain Medicine

## 2022-09-19 VITALS — BP 133/73 | HR 72 | Temp 97.3°F | Resp 16 | Ht 66.0 in | Wt 185.0 lb

## 2022-09-19 DIAGNOSIS — Z981 Arthrodesis status: Secondary | ICD-10-CM | POA: Diagnosis not present

## 2022-09-19 DIAGNOSIS — M47816 Spondylosis without myelopathy or radiculopathy, lumbar region: Secondary | ICD-10-CM | POA: Diagnosis not present

## 2022-09-19 DIAGNOSIS — M961 Postlaminectomy syndrome, not elsewhere classified: Secondary | ICD-10-CM | POA: Diagnosis not present

## 2022-09-19 DIAGNOSIS — M5459 Other low back pain: Secondary | ICD-10-CM | POA: Insufficient documentation

## 2022-09-19 DIAGNOSIS — M4316 Spondylolisthesis, lumbar region: Secondary | ICD-10-CM | POA: Insufficient documentation

## 2022-09-19 DIAGNOSIS — M5136 Other intervertebral disc degeneration, lumbar region: Secondary | ICD-10-CM | POA: Insufficient documentation

## 2022-09-19 DIAGNOSIS — Z967 Presence of other bone and tendon implants: Secondary | ICD-10-CM | POA: Insufficient documentation

## 2022-09-19 DIAGNOSIS — Z9682 Presence of neurostimulator: Secondary | ICD-10-CM | POA: Diagnosis not present

## 2022-09-19 DIAGNOSIS — M545 Low back pain, unspecified: Secondary | ICD-10-CM | POA: Insufficient documentation

## 2022-09-19 DIAGNOSIS — G8929 Other chronic pain: Secondary | ICD-10-CM | POA: Diagnosis not present

## 2022-09-19 DIAGNOSIS — Z7901 Long term (current) use of anticoagulants: Secondary | ICD-10-CM | POA: Insufficient documentation

## 2022-09-19 DIAGNOSIS — R937 Abnormal findings on diagnostic imaging of other parts of musculoskeletal system: Secondary | ICD-10-CM | POA: Diagnosis not present

## 2022-09-19 DIAGNOSIS — M4310 Spondylolisthesis, site unspecified: Secondary | ICD-10-CM | POA: Insufficient documentation

## 2022-09-19 MED ORDER — TRIAMCINOLONE ACETONIDE 40 MG/ML IJ SUSP
INTRAMUSCULAR | Status: AC
  Filled 2022-09-19: qty 2

## 2022-09-19 MED ORDER — TRIAMCINOLONE ACETONIDE 40 MG/ML IJ SUSP
80.0000 mg | Freq: Once | INTRAMUSCULAR | Status: AC
  Administered 2022-09-19: 80 mg

## 2022-09-19 MED ORDER — MIDAZOLAM HCL 5 MG/5ML IJ SOLN
0.5000 mg | Freq: Once | INTRAMUSCULAR | Status: AC
  Administered 2022-09-19: 2 mg via INTRAVENOUS

## 2022-09-19 MED ORDER — FENTANYL CITRATE (PF) 100 MCG/2ML IJ SOLN
INTRAMUSCULAR | Status: AC
  Filled 2022-09-19: qty 2

## 2022-09-19 MED ORDER — LACTATED RINGERS IV SOLN: Freq: Once | INTRAVENOUS | Status: AC

## 2022-09-19 MED ORDER — FENTANYL CITRATE (PF) 100 MCG/2ML IJ SOLN: 25.0000 ug | INTRAMUSCULAR | Status: DC | PRN

## 2022-09-19 MED ORDER — LIDOCAINE HCL 2 % IJ SOLN
20.0000 mL | Freq: Once | INTRAMUSCULAR | Status: AC
  Administered 2022-09-19: 400 mg

## 2022-09-19 MED ORDER — LIDOCAINE HCL 2 % IJ SOLN
INTRAMUSCULAR | Status: AC
  Filled 2022-09-19: qty 20

## 2022-09-19 MED ORDER — ROPIVACAINE HCL 2 MG/ML IJ SOLN
INTRAMUSCULAR | Status: AC
  Filled 2022-09-19: qty 20

## 2022-09-19 MED ORDER — PENTAFLUOROPROP-TETRAFLUOROETH EX AERO
INHALATION_SPRAY | Freq: Once | CUTANEOUS | Status: AC
  Administered 2022-09-19: 30 via TOPICAL
  Filled 2022-09-19: qty 116

## 2022-09-19 MED ORDER — ROPIVACAINE HCL 2 MG/ML IJ SOLN
18.0000 mL | Freq: Once | INTRAMUSCULAR | Status: AC
  Administered 2022-09-19: 18 mL via PERINEURAL

## 2022-09-19 MED ORDER — MIDAZOLAM HCL 5 MG/5ML IJ SOLN
INTRAMUSCULAR | Status: AC
  Filled 2022-09-19: qty 5

## 2022-09-19 NOTE — Patient Instructions (Signed)
____________________________________________________________________________________________  Post-Procedure Discharge Instructions  Instructions: Apply ice:  Purpose: This will minimize any swelling and discomfort after procedure.  When: Day of procedure, as soon as you get home. How: Fill a plastic sandwich bag with crushed ice. Cover it with a small towel and apply to injection site. How long: (15 min on, 15 min off) Apply for 15 minutes then remove x 15 minutes.  Repeat sequence on day of procedure, until you go to bed. Apply heat:  Purpose: To treat any soreness and discomfort from the procedure. When: Starting the next day after the procedure. How: Apply heat to procedure site starting the day following the procedure. How long: May continue to repeat daily, until discomfort goes away. Food intake: Start with clear liquids (like water) and advance to regular food, as tolerated.  Physical activities: Keep activities to a minimum for the first 8 hours after the procedure. After that, then as tolerated. Driving: If you have received any sedation, be responsible and do not drive. You are not allowed to drive for 24 hours after having sedation. Blood thinner: (Applies only to those taking blood thinners) You may restart your blood thinner 6 hours after your procedure. Insulin: (Applies only to Diabetic patients taking insulin) As soon as you can eat, you may resume your normal dosing schedule. Infection prevention: Keep procedure site clean and dry. Shower daily and clean area with soap and water. Post-procedure Pain Diary: Extremely important that this be done correctly and accurately. Recorded information will be used to determine the next step in treatment. For the purpose of accuracy, follow these rules: Evaluate only the area treated. Do not report or include pain from an untreated area. For the purpose of this evaluation, ignore all other areas of pain, except for the treated  area. After your procedure, avoid taking a long nap and attempting to complete the pain diary after you wake up. Instead, set your alarm clock to go off every hour, on the hour, for the initial 8 hours after the procedure. Document the duration of the numbing medicine, and the relief you are getting from it. Do not go to sleep and attempt to complete it later. It will not be accurate. If you received sedation, it is likely that you were given a medication that may cause amnesia. Because of this, completing the diary at a later time may cause the information to be inaccurate. This information is needed to plan your care. Follow-up appointment: Keep your post-procedure follow-up evaluation appointment after the procedure (usually 2 weeks for most procedures, 6 weeks for radiofrequencies). DO NOT FORGET to bring you pain diary with you.   Expect: (What should I expect to see with my procedure?) From numbing medicine (AKA: Local Anesthetics): Numbness or decrease in pain. You may also experience some weakness, which if present, could last for the duration of the local anesthetic. Onset: Full effect within 15 minutes of injected. Duration: It will depend on the type of local anesthetic used. On the average, 1 to 8 hours.  From steroids (Applies only if steroids were used): Decrease in swelling or inflammation. Once inflammation is improved, relief of the pain will follow. Onset of benefits: Depends on the amount of swelling present. The more swelling, the longer it will take for the benefits to be seen. In some cases, up to 10 days. Duration: Steroids will stay in the system x 2 weeks. Duration of benefits will depend on multiple posibilities including persistent irritating factors. Side-effects: If present, they   may typically last 2 weeks (the duration of the steroids). Frequent: Cramps (if they occur, drink Gatorade and take over-the-counter Magnesium 450-500 mg once to twice a day); water retention with  temporary weight gain; increases in blood sugar; decreased immune system response; increased appetite. Occasional: Facial flushing (red, warm cheeks); mood swings; menstrual changes. Uncommon: Long-term decrease or suppression of natural hormones; bone thinning. (These are more common with higher doses or more frequent use. This is why we prefer that our patients avoid having any injection therapies in other practices.)  Very Rare: Severe mood changes; psychosis; aseptic necrosis. From procedure: Some discomfort is to be expected once the numbing medicine wears off. This should be minimal if ice and heat are applied as instructed.  Call if: (When should I call?) You experience numbness and weakness that gets worse with time, as opposed to wearing off. New onset bowel or bladder incontinence. (Applies only to procedures done in the spine)  Emergency Numbers: Durning business hours (Monday - Thursday, 8:00 AM - 4:00 PM) (Friday, 9:00 AM - 12:00 Noon): [phone] After hours: [phone] NOTE: If you are having a problem and are unable connect with, or to talk to a provider, then go to your nearest urgent care or emergency department. If the problem is serious and urgent, please call 911. ____________________________________________________________________________________________    ____________________________________________________________________________________________  Blood Thinners  IMPORTANT NOTICE:  If you take any of these, make sure to notify the nursing staff.  Failure to do so may result in injury.  Recommended time intervals to stop and restart blood-thinners, before & after invasive procedures  Generic Name Brand Name Pre-procedure. Stop this long before procedure. Post-procedure. Minimum waiting period before restarting.  Abciximab Reopro 15 days 2 hrs  Alteplase Activase 10 days 10 days  Anagrelide Agrylin    Apixaban Eliquis 3 days 6 hrs  Cilostazol Pletal 3  days 5 hrs  Clopidogrel Plavix 7-10 days 2 hrs  Dabigatran Pradaxa 5 days 6 hrs  Dalteparin Fragmin 24 hours 4 hrs  Dipyridamole Aggrenox 11days 2 hrs  Edoxaban Lixiana; Savaysa 3 days 2 hrs  Enoxaparin  Lovenox 24 hours 4 hrs  Eptifibatide Integrillin 8 hours 2 hrs  Fondaparinux  Arixtra 72 hours 12 hrs  Hydroxychloroquine Plaquenil 11 days   Prasugrel Effient 7-10 days 6 hrs  Reteplase Retavase 10 days 10 days  Rivaroxaban Xarelto 3 days 6 hrs  Ticagrelor Brilinta 5-7 days 6 hrs  Ticlopidine Ticlid 10-14 days 2 hrs  Tinzaparin Innohep 24 hours 4 hrs  Tirofiban Aggrastat 8 hours 2 hrs  Warfarin Coumadin 5 days 2 hrs   Other medications with blood-thinning effects  Product indications Generic (Brand) names Note  Cholesterol Lipitor Stop 4 days before procedure  Blood thinner (injectable) Heparin (LMW or LMWH Heparin) Stop 24 hours before procedure  Cancer Ibrutinib (Imbruvica) Stop 7 days before procedure  Malaria/Rheumatoid Hydroxychloroquine (Plaquenil) Stop 11 days before procedure  Thrombolytics  10 days before or after procedures   Over-the-counter (OTC) Products with blood-thinning effects  Product Common names Stop Time  Aspirin > 325 mg Goody Powders, Excedrin, etc. 11 days  Aspirin ? 81 mg  7 days  Fish oil  4 days  Garlic supplements  7 days  Ginkgo biloba  36 hours  Ginseng  24 hours  NSAIDs Ibuprofen, Naprosyn, etc. 3 days  Vitamin E  4 days   ____________________________________________________________________________________________    

## 2022-09-19 NOTE — Progress Notes (Signed)
PROVIDER NOTE: Interpretation of information contained herein should be left to medically-trained personnel. Specific patient instructions are provided elsewhere under "Patient Instructions" section of medical record. This document was created in part using STT-dictation technology, any transcriptional errors that may result from this process are unintentional.  Patient: Tammy [name] Type: Established DOB: Feb 19, 1946 MRN: [id number] PCP: Dorcas Carrow, DO  Service: Procedure DOS: 09/19/2022 Setting: Ambulatory Location: Ambulatory outpatient facility Delivery: Face-to-face Provider: Oswaldo Done, MD Specialty: Interventional Pain Management Specialty designation: 09 Location: Outpatient facility Ref. Prov.: Delano Metz, MD       Interventional Therapy   Procedure: Lumbar Facet, Medial Branch Block(s) #1 + Medtronic spinal cord stimulator analysis and programming Laterality: Bilateral  Level: L2, L3, L4, L5, and S1 Medial Branch Level(s). Injecting these levels blocks the L3-4, L4-5, and L5-S1 lumbar facet joints. Imaging: Fluoroscopic guidance         Anesthesia: Local anesthesia (1-2% Lidocaine) Anxiolysis: IV Versed 2.0 mg Sedation: Moderate Sedation None required. No Fentanyl administered.         DOS: 09/19/2022 Performed by: Oswaldo Done, MD  Primary Purpose: Diagnostic/Therapeutic Indications: Low back pain severe enough to impact quality of life or function. 1. Chronic low back pain (1ry area of Pain) (Bilateral) (R>L) w/o sciatica   2. Grade 1 Retrolisthesis of lumbar spine (L2/L3, L3/L4)   3. Grade 1 Anterolisthesis of lumbar spine (L4/L5)   4. Lumbar facet joint pain   5. DDD (degenerative disc disease), lumbar   6. Failed back surgical syndrome   7. Fixation hardware in spine   8. History of lumbar spinal fusion   9. Lumbar facet joint syndrome (Bilateral)   10. Neurostimulator device in situ (T8-9 leads)   11. Abnormal CT scan, lumbar spine  (02/14/2019)   12. Lumbar facet arthropathy (Multilevel) (Bilateral)   13. Chronic anticoagulation (Eliquis)    NAS-11 Pain score:   Pre-procedure: 8 /10   Post-procedure: 0-No pain/10     Position / Prep / Materials:  Position: Prone  Prep solution: DuraPrep (Iodine Povacrylex [0.7% available iodine] and Isopropyl Alcohol, 74% w/w) Area Prepped: Posterolateral Lumbosacral Spine (Wide prep: From the lower border of the scapula down to the end of the tailbone and from flank to flank.)  Materials:  Tray: Block Needle(s):  Type: Spinal  Gauge (G): 22  Length: 3.5-in Qty: 4      H&P (Pre-op Assessment):  Tammy [name] is a 77 y.o. (year old), female patient, seen today for interventional treatment. She  has a past surgical history that includes Angioplasty / stenting femoral; Trigger finger release; Thyroidectomy; Cholecystectomy; Cardiac catheterization (12/07/2011); Cardiac catheterization (01/04/2011); Colonoscopy (2013); laparoscopic appendectomy (N/A, 01/06/2015); Appendectomy (12/2014); Colonoscopy with propofol (N/A, 05/24/2015); polypectomy (05/24/2015); Mastectomy w/ sentinel node biopsy (Left, 08/26/2018); Breast surgery; Diagnostic laparoscopy; Colonoscopy with propofol (N/A, 01/28/2019); Esophagogastroduodenoscopy (egd) with propofol (N/A, 01/28/2019); Back surgery (03/2012); Back surgery; Back surgery (11/2016); LEFT HEART CATH AND CORONARY ANGIOGRAPHY (Left, 04/04/2019); Total knee arthroplasty (Right, 11/13/2019); Breast lumpectomy (Left, 08/26/2018); Replacement total knee (Right); RIGHT/LEFT HEART CATH AND CORONARY ANGIOGRAPHY (Bilateral, 09/26/2021); and Cataract extraction (Right, 12/21/2021). Tammy [name] has a current medication list which includes the following prescription(s): acetaminophen, albuterol, albuterol, anastrozole, apixaban, calcium 600 + d, cefuroxime, cholecalciferol, freestyle libre 2 reader, freestyle libre 2 sensor, cranberry, duloxetine, fluticasone, trelegy ellipta,  gabapentin, glipizide, isosorbide mononitrate, levothyroxine, metoprolol tartrate, montelukast, myrbetriq, nitroglycerin, pantoprazole, pumpkin seed-soy germ, rosuvastatin, tirzepatide, trimethoprim, UNABLE TO FIND, and zinc citrate, and the following Facility-Administered Medications: fentanyl. Her primarily  concern today is the Back Pain (lower)  Initial Vital Signs:  Pulse/HCG Rate: 76ECG Heart Rate: 89 (NSR) Temp: (!) 97.3 F (36.3 C) Resp: 16 BP: 104/74 SpO2: 97 %  BMI: Estimated body mass index is 29.86 kg/m as calculated from the following:   Height as of this encounter: 5\' 6"  (1.676 m).   Weight as of this encounter: 185 lb (83.9 kg).  Risk Assessment: Allergies: Reviewed. She is allergic to hydrocodone, aleve [naproxen sodium], contrast media [iodinated contrast media], ioxaglate, oxycodone, ranexa [ranolazine], sulfa antibiotics, and tramadol.  Allergy Precautions: None required Coagulopathies: Reviewed. None identified.  Blood-thinner therapy: None at this time Active Infection(s): Reviewed. None identified. Tammy [name] is afebrile  Site Confirmation: Tammy [name] was asked to confirm the procedure and laterality before marking the site Procedure checklist: Completed Consent: Before the procedure and under the influence of no sedative(s), amnesic(s), or anxiolytics, the patient was informed of the treatment options, risks and possible complications. To fulfill our ethical and legal obligations, as recommended by the American Medical Association's Code of Ethics, I have informed the patient of my clinical impression; the nature and purpose of the treatment or procedure; the risks, benefits, and possible complications of the intervention; the alternatives, including doing nothing; the risk(s) and benefit(s) of the alternative treatment(s) or procedure(s); and the risk(s) and benefit(s) of doing nothing. The patient was provided information about the general risks and possible complications  associated with the procedure. These may include, but are not limited to: failure to achieve desired goals, infection, bleeding, organ or nerve damage, allergic reactions, paralysis, and death. In addition, the patient was informed of those risks and complications associated to Spine-related procedures, such as failure to decrease pain; infection (i.e.: Meningitis, epidural or intraspinal abscess); bleeding (i.e.: epidural hematoma, subarachnoid hemorrhage, or any other type of intraspinal or peri-dural bleeding); organ or nerve damage (i.e.: Any type of peripheral nerve, nerve root, or spinal cord injury) with subsequent damage to sensory, motor, and/or autonomic systems, resulting in permanent pain, numbness, and/or weakness of one or several areas of the body; allergic reactions; (i.e.: anaphylactic reaction); and/or death. Furthermore, the patient was informed of those risks and complications associated with the medications. These include, but are not limited to: allergic reactions (i.e.: anaphylactic or anaphylactoid reaction(s)); adrenal axis suppression; blood sugar elevation that in diabetics may result in ketoacidosis or comma; water retention that in patients with history of congestive heart failure may result in shortness of breath, pulmonary edema, and decompensation with resultant heart failure; weight gain; swelling or edema; medication-induced neural toxicity; particulate matter embolism and blood vessel occlusion with resultant organ, and/or nervous system infarction; and/or aseptic necrosis of one or more joints. Finally, the patient was informed that Medicine is not an exact science; therefore, there is also the possibility of unforeseen or unpredictable risks and/or possible complications that may result in a catastrophic outcome. The patient indicated having understood very clearly. We have given the patient no guarantees and we have made no promises. Enough time was given to the patient to  ask questions, all of which were answered to the patient's satisfaction. Ms. Seifried has indicated that she wanted to continue with the procedure. Attestation: I, the ordering provider, attest that I have discussed with the patient the benefits, risks, side-effects, alternatives, likelihood of achieving goals, and potential problems during recovery for the procedure that I have provided informed consent. Date  Time: 09/19/2022  7:52 AM   Pre-Procedure Preparation:  Monitoring: As per clinic protocol. Respiration,  ETCO2, SpO2, BP, heart rate and rhythm monitor placed and checked for adequate function Safety Precautions: Patient was assessed for positional comfort and pressure points before starting the procedure. Time-out: I initiated and conducted the "Time-out" before starting the procedure, as per protocol. The patient was asked to participate by confirming the accuracy of the "Time Out" information. Verification of the correct person, site, and procedure were performed and confirmed by me, the nursing staff, and the patient. "Time-out" conducted as per Joint Commission's Universal Protocol (UP.01.01.01). Time: 0818 Start Time: 0818 hrs.  Description of Procedure:          Laterality: (see above) Targeted Levels: (see above)  Safety Precautions: Aspiration looking for blood return was conducted prior to all injections. At no point did we inject any substances, as a needle was being advanced. Before injecting, the patient was told to immediately notify me if she was experiencing any new onset of "ringing in the ears, or metallic taste in the mouth". No attempts were made at seeking any paresthesias. Safe injection practices and needle disposal techniques used. Medications properly checked for expiration dates. SDV (single dose vial) medications used. After the completion of the procedure, all disposable equipment used was discarded in the proper designated medical waste containers. Local Anesthesia:  Protocol guidelines were followed. The patient was positioned over the fluoroscopy table. The area was prepped in the usual manner. The time-out was completed. The target area was identified using fluoroscopy. A 12-in long, straight, sterile hemostat was used with fluoroscopic guidance to locate the targets for each level blocked. Once located, the skin was marked with an approved surgical skin marker. Once all sites were marked, the skin (epidermis, dermis, and hypodermis), as well as deeper tissues (fat, connective tissue and muscle) were infiltrated with a small amount of a short-acting local anesthetic, loaded on a 10cc syringe with a 25G, 1.5-in  Needle. An appropriate amount of time was allowed for local anesthetics to take effect before proceeding to the next step. Local Anesthetic: Lidocaine 2.0% The unused portion of the local anesthetic was discarded in the proper designated containers. Technical description of process:  L2 Medial Branch Nerve Block (MBB): The target area for the L2 medial branch is at the junction of the postero-lateral aspect of the superior articular process and the superior, posterior, and medial edge of the transverse process of L3. Under fluoroscopic guidance, a Quincke needle was inserted until contact was made with os over the superior postero-lateral aspect of the pedicular shadow (target area). After negative aspiration for blood, 0.5 mL of the nerve block solution was injected without difficulty or complication. The needle was removed intact. L3 Medial Branch Nerve Block (MBB): The target area for the L3 medial branch is at the junction of the postero-lateral aspect of the superior articular process and the superior, posterior, and medial edge of the transverse process of L4. Under fluoroscopic guidance, a Quincke needle was inserted until contact was made with os over the superior postero-lateral aspect of the pedicular shadow (target area). After negative aspiration for  blood, 0.5 mL of the nerve block solution was injected without difficulty or complication. The needle was removed intact. L4 Medial Branch Nerve Block (MBB): The target area for the L4 medial branch is at the junction of the postero-lateral aspect of the superior articular process and the superior, posterior, and medial edge of the transverse process of L5. Under fluoroscopic guidance, a Quincke needle was inserted until contact was made with os over the superior  postero-lateral aspect of the pedicular shadow (target area). After negative aspiration for blood, 0.5 mL of the nerve block solution was injected without difficulty or complication. The needle was removed intact. L5 Medial Branch Nerve Block (MBB): The target area for the L5 medial branch is at the junction of the postero-lateral aspect of the superior articular process and the superior, posterior, and medial edge of the sacral ala. Under fluoroscopic guidance, a Quincke needle was inserted until contact was made with os over the superior postero-lateral aspect of the pedicular shadow (target area). After negative aspiration for blood, 0.5 mL of the nerve block solution was injected without difficulty or complication. The needle was removed intact. S1 Medial Branch Nerve Block (MBB): The target area for the S1 medial branch is at the posterior and inferior 6 o'clock position of the L5-S1 facet joint. Under fluoroscopic guidance, the Quincke needle inserted for the L5 MBB was redirected until contact was made with os over the inferior and postero aspect of the sacrum, at the 6 o' clock position under the L5-S1 facet joint (Target area). After negative aspiration for blood, 0.5 mL of the nerve block solution was injected without difficulty or complication. The needle was removed intact.  Once the entire procedure was completed, the treated area was cleaned, making sure to leave some of the prepping solution back to take advantage of its long term  bactericidal properties.         Illustration of the posterior view of the lumbar spine and the posterior neural structures. Laminae of L2 through S1 are labeled. DPRL5, dorsal primary ramus of L5; DPRS1, dorsal primary ramus of S1; DPR3, dorsal primary ramus of L3; FJ, facet (zygapophyseal) joint L3-L4; I, inferior articular process of L4; LB1, lateral branch of dorsal primary ramus of L1; IAB, inferior articular branches from L3 medial branch (supplies L4-L5 facet joint); IBP, intermediate branch plexus; MB3, medial branch of dorsal primary ramus of L3; NR3, third lumbar nerve root; S, superior articular process of L5; SAB, superior articular branches from L4 (supplies L4-5 facet joint also); TP3, transverse process of L3.   Facet Joint Innervation (* possible contribution)  L1-2 T12, L1 (L2*)  Medial Branch  L2-3 L1, L2 (L3*)         "          "  L3-4 L2, L3 (L4*)         "          "  L4-5 L3, L4 (L5*)         "          "  L5-S1 L4, L5, S1          "          "    Vitals:   09/19/22 0821 09/19/22 0826 09/19/22 0831 09/19/22 0850  BP: (!) 129/49 133/73    Pulse:    72  Resp: 18 15 18 16   Temp:    (!) 97.3 F (36.3 C)  SpO2: 100% 100% 100% 98%  Weight:      Height:         End Time: 0831 hrs.  Imaging Guidance (Spinal):          Type of Imaging Technique: Fluoroscopy Guidance (Spinal) Indication(s): Assistance in needle guidance and placement for procedures requiring needle placement in or near specific anatomical locations not easily accessible without such assistance. Exposure Time: Please see nurses notes. Contrast: None used. Fluoroscopic Guidance: I was personally  present during the use of fluoroscopy. "Tunnel Vision Technique" used to obtain the best possible view of the target area. Parallax error corrected before commencing the procedure. "Direction-depth-direction" technique used to introduce the needle under continuous pulsed fluoroscopy. Once target was reached,  antero-posterior, oblique, and lateral fluoroscopic projection used confirm needle placement in all planes. Images permanently stored in EMR. Interpretation: No contrast injected. I personally interpreted the imaging intraoperatively. Adequate needle placement confirmed in multiple planes. Permanent images saved into the patient's record.  Post-operative Assessment:  Post-procedure Vital Signs:  Pulse/HCG Rate: 7275 (NSR) Temp: (!) 97.3 F (36.3 C) Resp: 16 BP: 133/73 SpO2: 98 %  EBL: None  Complications: No immediate post-treatment complications observed by team, or reported by patient.  Note: The patient tolerated the entire procedure well. A repeat set of vitals were taken after the procedure and the patient was kept under observation following institutional policy, for this type of procedure. Post-procedural neurological assessment was performed, showing return to baseline, prior to discharge. The patient was provided with post-procedure discharge instructions, including a section on how to identify potential problems. Should any problems arise concerning this procedure, the patient was given instructions to immediately contact us, at any time, without hesitation. In any case, we plan to contact the patient by telephone for a follow-up status report regarding this interventional procedure.  Comments:  No additional relevant information.  Plan of Care (POC)  Orders:  Orders Placed This Encounter  Procedures   LUMBAR FACET(MEDIAL BRANCH NERVE BLOCK) MBNB    Scheduling Instructions:     Procedure: Lumbar facet block (AKA.: Lumbosacral medial branch nerve block)     Side: Bilateral     Level: L3-4, L4-5, L5-S1, and TBD Facets (L2, L3, L4, L5, S1, and TBD Medial Branch Nerves)     Sedation: Patient's choice.     Timeframe: Today    Order Specific Question:   Where will this procedure be performed?    Answer:   [facility]   DG PAIN [facility] C-ARM 1-60 MIN NO REPORT     Intraoperative interpretation by procedural physician at [facility].    Standing Status:   Standing    Number of Occurrences:   1    Order Specific Question:   Reason for exam:    Answer:   Assistance in needle guidance and placement for procedures requiring needle placement in or near specific anatomical locations not easily accessible without such assistance.   Informed Consent Details: Physician/Practitioner Attestation; Transcribe to consent form and obtain patient signature    Nursing Order: Transcribe to consent form and obtain patient signature. Note: Always confirm laterality of pain with Ms. Kreischer, before procedure.    Order Specific Question:   Physician/Practitioner attestation of informed consent for procedure/surgical case    Answer:   I, the physician/practitioner, attest that I have discussed with the patient the benefits, risks, side effects, alternatives, likelihood of achieving goals and potential problems during recovery for the procedure that I have provided informed consent.    Order Specific Question:   Procedure    Answer:   Lumbar Facet Block  under fluoroscopic guidance    Order Specific Question:   Physician/Practitioner performing the procedure    Answer:   Babbie Dondlinger A. Laban Emperor MD    Order Specific Question:   Indication/Reason    Answer:   Low Back Pain, with our without leg pain, due to Facet Joint Arthralgia (Joint Pain) Spondylosis (Arthritis of the Spine), without myelopathy or radiculopathy (  Nerve Damage).   Care order/instruction: Please confirm that the patient has stopped the Eliquis (Apixaban) x 3 days prior to procedure or surgery.    Please confirm that the patient has stopped the Eliquis (Apixaban) x 3 days prior to procedure or surgery.    Standing Status:   Standing    Number of Occurrences:   1   Provide equipment / supplies at bedside    Procedure tray: "Block Tray" (Disposable  single use) Skin infiltration needle: Regular 1.5-in, 25-G,  (x1) Block Needle type: Spinal Amount/quantity: 4 Size: Regular (3.5-inch) Gauge: 22G    Standing Status:   Standing    Number of Occurrences:   1    Order Specific Question:   Specify    Answer:   Block Tray   Bleeding precautions    Standing Status:   Standing    Number of Occurrences:   1   Chronic Opioid Analgesic:  No chronic opioid analgesics therapy prescribed by our practice. None MME/day: 0 mg/day   Medications ordered for procedure: Meds ordered this encounter  Medications   lidocaine (XYLOCAINE) 2 % (with pres) injection 400 mg   pentafluoroprop-tetrafluoroeth (GEBAUERS) aerosol   lactated ringers infusion   midazolam (VERSED) 5 MG/5ML injection 0.5-2 mg    Make sure Flumazenil is available in the pyxis when using this medication. If oversedation occurs, administer 0.2 mg IV over 15 sec. If after 45 sec no response, administer 0.2 mg again over 1 min; may repeat at 1 min intervals; not to exceed 4 doses (1 mg)   fentaNYL (SUBLIMAZE) injection 25-50 mcg    Make sure Narcan is available in the pyxis when using this medication. In the event of respiratory depression (RR< 8/min): Titrate NARCAN (naloxone) in increments of 0.1 to 0.2 mg IV at 2-3 minute intervals, until desired degree of reversal.   ropivacaine (PF) 2 mg/mL (0.2%) (NAROPIN) injection 18 mL   triamcinolone acetonide (KENALOG-40) injection 80 mg   Medications administered: We administered lidocaine, pentafluoroprop-tetrafluoroeth, lactated ringers, midazolam, ropivacaine (PF) 2 mg/mL (0.2%), and triamcinolone acetonide.  See the medical record for exact dosing, route, and time of administration.  Follow-up plan:   Return in about 2 weeks (around 10/03/2022) for Proc-day (T,Th), (Face2F), (PPE).       Interventional Therapies  Risk Factors  Considerations:  Eliquis Anticoagulation: (Stop: 3 days  Restart: 6 hours)   Allergy: CONTRAST Dye, Iodine, Hydrocodone, Oxycodone, Tramadol, Naproxen (Aleve)   T2IDDM  PVD w/ LE stents  CAD  Unstable Angina  HTN  PVD  COPD  Emphysema  Lung Cancer  CKD Stage3b  Hx. Stroke  Hx. PE  Hx. DVT  GERD       Normal. Non-continuous. NO RFA 2ry to HARDWARE    Planned  Pending:   Diagnostic bilateral lumbar MBB (L2-S1) #1 (09/19/2022)  [employer] spinal cord stimulator adjustment (analysis and programming) (09/19/2022)    Under consideration:   Diagnostic right genicular nerve block #1    Completed:   None at this time   Therapeutic  Palliative (PRN) options:   Unknown ancillary procedure (04/05/2021) by Merri Ray, DO [facility] PMR)  Permanent spinal cord stimulator implant by Rockwell Germany, MD ([facility] surgeon)    Completed by other providers:   (06/22/2017) bilateral lower extremity EMG/PNCV (normal) by [facility]  Spinal cord stimulator implant       Recent Visits Date Type Provider Dept  08/21/22 Office Visit Delano Metz, MD [facility]  07/12/22 Office Visit  Delano Metz, MD [facility]  06/28/22 Office Visit Delano Metz, MD [facility]  Showing recent visits within past 90 days and meeting all other requirements Today's Visits Date Type Provider Dept  09/19/22 Procedure visit Delano Metz, MD [facility]  Showing today's visits and meeting all other requirements Future Appointments Date Type Provider Dept  10/05/22 Appointment Delano Metz, MD [facility]  Showing future appointments within next 90 days and meeting all other requirements  Disposition: Discharge home  Discharge (Date  Time): 09/19/2022; 0900 hrs.   Primary Care Physician: Dorcas Carrow, DO Location: [facility] Note by: Oswaldo Done, MD (TTS technology used. I apologize for any typographical errors that were not detected and corrected.) Date: 09/19/2022; Time: 12:14 PM  Disclaimer:  Medicine is not an Doctor, general practice. The only guarantee in medicine is that nothing is guaranteed. It is important to note that the decision to proceed with this intervention was based on the information collected from the patient. The Data and conclusions were drawn from the patient's questionnaire, the interview, and the physical examination. Because the information was provided in large part by the patient, it cannot be guaranteed that it has not been purposely or unconsciously manipulated. Every effort has been made to obtain as much relevant data as possible for this evaluation. It is important to note that the conclusions that lead to this procedure are derived in large part from the available data. Always take into account that the treatment will also be dependent on availability of resources and existing treatment guidelines, considered by other Pain Management Practitioners as being common knowledge and practice, at the time of the intervention. For Medico-Legal purposes, it is also important to point out that variation in procedural techniques and pharmacological choices are the acceptable norm. The indications, contraindications, technique, and results of the above procedure should only be interpreted and judged by a Board-Certified Interventional Pain Specialist with extensive familiarity and expertise in the same exact procedure and technique.

## 2022-09-19 NOTE — Progress Notes (Signed)
Safety precautions to be maintained throughout the outpatient stay will include: orient to surroundings, keep bed in low position, maintain call bell within reach at all times, provide assistance with transfer out of bed and ambulation.  

## 2022-09-20 ENCOUNTER — Telehealth: Payer: Self-pay | Admitting: *Deleted

## 2022-09-20 NOTE — Telephone Encounter (Signed)
Attempted to call for post procedure follow-up. Message left. 

## 2022-09-27 ENCOUNTER — Telehealth: Payer: Self-pay | Admitting: Family Medicine

## 2022-09-27 ENCOUNTER — Other Ambulatory Visit: Payer: Self-pay

## 2022-09-27 NOTE — Telephone Encounter (Signed)
A user error has taken place: encounter opened in error, closed for administrative reasons.

## 2022-09-29 ENCOUNTER — Other Ambulatory Visit: Payer: Self-pay

## 2022-10-02 ENCOUNTER — Telehealth: Payer: Medicare Other

## 2022-10-02 ENCOUNTER — Ambulatory Visit (INDEPENDENT_AMBULATORY_CARE_PROVIDER_SITE_OTHER): Payer: Medicare Other

## 2022-10-02 DIAGNOSIS — G8929 Other chronic pain: Secondary | ICD-10-CM

## 2022-10-02 DIAGNOSIS — I25118 Atherosclerotic heart disease of native coronary artery with other forms of angina pectoris: Secondary | ICD-10-CM

## 2022-10-02 DIAGNOSIS — E782 Mixed hyperlipidemia: Secondary | ICD-10-CM

## 2022-10-02 DIAGNOSIS — Z794 Long term (current) use of insulin: Secondary | ICD-10-CM

## 2022-10-02 DIAGNOSIS — Z170 Estrogen receptor positive status [ER+]: Secondary | ICD-10-CM

## 2022-10-02 DIAGNOSIS — E1151 Type 2 diabetes mellitus with diabetic peripheral angiopathy without gangrene: Secondary | ICD-10-CM

## 2022-10-02 NOTE — Chronic Care Management (AMB) (Signed)
Chronic Care Management   CCM RN Visit Note  10/02/2022 Name: Tammy [name] MRN: [id number] DOB: [birth year]  Subjective: Tammy [name] is a 77 y.o. year old female who is a primary care patient of Dorcas Carrow, DO. The patient was referred to the Chronic Care Management team for assistance with care management needs subsequent to provider initiation of CCM services and plan of care.    Today's Visit:  Engaged with patient by telephone for follow up visit.     SDOH Interventions Today    Flowsheet Row Most Recent Value  SDOH Interventions   Physical Activity Interventions Intervention Not Indicated  [working with a cubii like device]  Health Literacy Interventions Intervention Not Indicated         Goals Addressed             This Visit's Progress    CCM (DIABETES) EXPECTED OUTCOME: MONITOR, SELF-MANAGE AND REDUCE SYMPTOMS OF DIABETES       Current Barriers:  Knowledge Deficits related to how to effectively manage and maintain DM and goal of A1C <8% Chronic Disease Management support and education needs related to effective management DM Goal set by the provider <8.1% Lab Results  Component Value Date   HGBA1C 6.5 (H) 09/04/2022     Planned Interventions: Provided education to patient about basic DM disease process. Education provided. The patient has a stable A1C. Is at goal and is happy that she is at goal. She feels great and it has been a long time since she felt this good. Education and support provided. ; Reviewed medications with patient and discussed importance of medication adherence. The patient is compliant with medications. The patient states she was able to get the Operating Room Services from the outpatient pharmacy and is thankful for the resource. Has her medications now. Denies any acute changes at this time. Denies any new concerns related to medications. Continues to get her medications from the outpatient pharmacy at [facility]. Is happy they have her medications in stock.   Reviewed prescribed diet with patient heart healthy/ADA diet. The patient is doing well and says that she has lost weight. She is eating but not snacking like she was. She will drink boost every now and then to help with protein intake.  She in addition to the [facility] is taking "Golo". She states that she feels the best she has felt in a really long time. Education and support given.  Counseled on importance of regular laboratory monitoring as prescribed. Labs are up to date. Denies any acute findings. A1C is stable and trending down.;        Discussed plans with patient for ongoing care management follow up and provided patient with direct contact information for care management team;      Provided patient with written educational materials related to hypo and hyperglycemia and importance of correct treatment. Review of how to effectively manage drops in blood sugars or highs. The patient states the [facility] is really helpful and alerts her when her blood sugars is high or low. Continues to use the freestyle Ladora. Saw a blood pressure of 300 yesterday but it came back down. She feels this was due to the nerve block procedure she had recently.     Reviewed scheduled/upcoming provider appointments including: Saw the pcp on 09-04-2022, sees on a regular basis.  Advised patient, providing education and rationale, to check cbg when you have symptoms of low or high blood sugar and has freestyle libre  and  record. States her blood sugars are really good. Her blood sugars  have been up and down. She states that she got the steroid nerve block recently and it has been elevated some. Today it is better and at the time of the call her reading was 120. call provider for findings outside established parameters;       Review of patient status, including review of consultants reports, relevant laboratory and other test results, and medications completed;       Advised patient to discuss changes in DM and  diabetes health with provider;      Screening for signs and symptoms of depression related to chronic disease state;        Assessed social determinant of health barriers;     The patient states she feels the best she has felt in a really long time. She says she is able to do things she has not been able to do in a long time. She will see the specialist on Thursday that did the nerve block. Education and support given.      Symptom Management: Take medications as prescribed   Attend all scheduled provider appointments Call pharmacy for medication refills 3-7 days in advance of running out of medications Call provider office for new concerns or questions  call the Suicide and Crisis Lifeline: 988 call the [place] National Suicide Prevention Lifeline: (904)167-0391 or TTY: 325-032-0607 TTY 709-164-7178) to talk to a trained counselor call 1-800-273-TALK (toll free, 24 hour hotline) if experiencing a Mental Health or Behavioral Health Crisis  check feet daily for cuts, sores or redness trim toenails straight across manage portion size wash and dry feet carefully every day wear comfortable, cotton socks wear comfortable, well-fitting shoes  Follow Up Plan: Telephone follow up appointment with care management team member scheduled for: 11-27-2022 at 1030 am       CCM Expected Outcome:  Monitor, Self-Manage and Reduce Symptoms of  breast cancer       Current Barriers:  Chronic Disease Management support and education needs related to effective management of breast cancer  Planned Interventions: Assessed patient understanding of cancer diagnosis and recommended treatment plan. Denies any new concerns with her breast cancer health and well being. Sees specialist on a regular basis. Education provided Reviewed upcoming provider appointments and treatment appointments. Saw the pcp on 11-27-2022 at 1030 am Assessed available transportation to appointments and treatments. Has consistent/reliable  transportation: Yes Assessed support system. Has consistent/reliable family or other support: Yes Nutrition assessment performed The patient saw the oncologist yesterday and current treatment plan with Arimidex is effective. The patient states there were  no changes in the plan of care for her breast cancer treatment plan. Follows up accordingly with the oncologist.  Denies any new concerns related to her history of breast cancer   Symptom Management: Take medications as prescribed   Attend all scheduled provider appointments Call pharmacy for medication refills 3-7 days in advance of running out of medications Call provider office for new concerns or questions  call the Suicide and Crisis Lifeline: 988 call the [place] National Suicide Prevention Lifeline: 319 581 7290 or TTY: (202)724-0682 TTY 910-862-9070) to talk to a trained counselor call 1-800-273-TALK (toll free, 24 hour hotline) if experiencing a Mental Health or Behavioral Health Crisis   Follow Up Plan: Telephone follow up appointment with care management team member scheduled for: 11-27-2022 at 1030 am       CCM Expected Outcome:  Monitor, Self-Manage and Reduce Symptoms of CAD  Current Barriers:  Chronic Disease Management support and education needs related to effective management of CAD BP Readings from Last 3 Encounters:  09/19/22 133/73  09/04/22 104/67  08/21/22 (!) 105/40     Planned Interventions: Assessed understanding of CAD diagnosis. The patient states that she is much better and having a good day today. She states she feels the best she has felt in a really long time. Her CAD is stable and Her blood pressures are good. She will se the pain specialist on Thursday that did her recent nerve block and she will talk to the specialist about the stents in her legs. It has been 10 years and she feels it is time for them to be rechecked due to cramps she has.  Medications reviewed including medications utilized in CAD  treatment plan. Is compliant with medications. Denies any new concerns with medications.  Provided education on importance of blood pressure control in management of CAD. Her blood pressures are stable. She denies any acute findings with changes in her blood pressures. ; Provided education on Importance of limiting foods high in cholesterol. Has lost weight, eats healthy, does not eat between meals.; Counseled on importance of regular laboratory monitoring as prescribed. The patient has regular lab work.; Reviewed Importance of taking all medications as prescribed. Takes medications as prescribed.  Reviewed Importance of attending all scheduled provider appointments. Sees the pcp on a regular basis. Last saw pcp on 09-04-2022 Advised to report any changes in symptoms or exercise tolerance Advised patient to discuss changes in CAD or heart health with provider; Screening for signs and symptoms of depression related to chronic disease state;  Assessed social determinant of health barriers;   Symptom Management: Take medications as prescribed   Attend all scheduled provider appointments Call provider office for new concerns or questions  call the Suicide and Crisis Lifeline: 988 call the [place] National Suicide Prevention Lifeline: (760) 168-6178 or TTY: 902-211-1807 TTY 603-472-4278) to talk to a trained counselor call 1-800-273-TALK (toll free, 24 hour hotline) if experiencing a Mental Health or Behavioral Health Crisis   Follow Up Plan: Telephone follow up appointment with care management team member scheduled for: 11-27-2022 at 1030 am       CCM Expected Outcome:  Monitor, Self-Manage and Reduce Symptoms of: Falls prevention and safety       Current Barriers:  Care Coordination needs related to falls prevention and safety in a patient with  high fall risk in a patient with recent falls with injury 2 weeks ago Chronic Disease Management support and education needs related to falls and safety  prevention  Planned Interventions: Provided written and verbal education re: potential causes of falls and Fall prevention strategies. The patient feels the best she has in a really long time. She denies any acute changes and is not having to use her cane or walker as much.  Reviewed medications and discussed potential side effects of medications such as dizziness and frequent urination. Medications reviewed. No new issues with medications Advised patient of importance of notifying provider of falls. Education on calling the office for new falls or safety concerns Assessed for signs and symptoms of orthostatic hypotension Assessed for falls since last encounter. Last fall was in October. The patient denies any new falls. Review of fall and safety prevention. The patient has had broken ribs from the fall and when she had pneumonia in December/January she feels this has aggravated the area. She is doing well. No new falls.  Assessed patients knowledge  of fall risk prevention secondary to previously provided education. The patient is doing better.She feels the best she has felt in a really long time. She denies any acute changes.  Advised patient to discuss new falls when they occur, changes in conditions that create increased risk for falls and safety concerns with provider Screening for signs and symptoms of depression related to chronic disease state Assessed social determinant of health barriers  Symptom Management: Take medications as prescribed   Attend all scheduled provider appointments Call provider office for new concerns or questions  Work with the social worker to address care coordination needs and will continue to work with the clinical team to address health care and disease management related needs call the Suicide and Crisis Lifeline: 988 call the [place] National Suicide Prevention Lifeline: 657-782-6749 or TTY: 352-680-7757 TTY (484)560-4518) to talk to a trained counselor call  1-800-273-TALK (toll free, 24 hour hotline) if experiencing a Mental Health or Behavioral Health Crisis   Follow Up Plan: Telephone follow up appointment with care management team member scheduled for: 11-27-2022 at 1030 am          Plan:Telephone follow up appointment with care management team member scheduled for:  11-27-2022 at 1030 am  Alto Denver RN, MSN, CCM RN Care Manager  Chronic Care Management Direct Number: [phone]

## 2022-10-02 NOTE — Patient Instructions (Signed)
Please call the care guide team at [phone] if you need to cancel or reschedule your appointment.   If you are experiencing a Mental Health or Behavioral Health Crisis or need someone to talk to, please call the Suicide and Crisis Lifeline: 988 call the [place] National Suicide Prevention Lifeline: 231 250 7128 or TTY: (301)511-1787 TTY (313)151-1768) to talk to a trained counselor call 1-800-273-TALK (toll free, 24 hour hotline)   Following is a copy of the CCM Program Consent:  CCM service includes personalized support from designated clinical staff supervised by the physician, including individualized plan of care and coordination with other care providers 24/7 contact phone numbers for assistance for urgent and routine care needs. Service will only be billed when office clinical staff spend 20 minutes or more in a month to coordinate care. Only one practitioner may furnish and bill the service in a calendar month. The patient may stop CCM services at amy time (effective at the end of the month) by phone call to the office staff. The patient will be responsible for cost sharing (co-pay) or up to 20% of the service fee (after annual deductible is met)  Following is a copy of your full provider care plan:   Goals Addressed             This Visit's Progress    CCM (DIABETES) EXPECTED OUTCOME: MONITOR, SELF-MANAGE AND REDUCE SYMPTOMS OF DIABETES       Current Barriers:  Knowledge Deficits related to how to effectively manage and maintain DM and goal of A1C <8% Chronic Disease Management support and education needs related to effective management DM Goal set by the provider <8.1% Lab Results  Component Value Date   HGBA1C 6.5 (H) 09/04/2022     Planned Interventions: Provided education to patient about basic DM disease process. Education provided. The patient has a stable A1C. Is at goal and is happy that she is at goal. She feels great and it has been a long time since she felt this  good. Education and support provided. ; Reviewed medications with patient and discussed importance of medication adherence. The patient is compliant with medications. The patient states she was able to get the [facility] from the outpatient pharmacy and is thankful for the resource. Has her medications now. Denies any acute changes at this time. Denies any new concerns related to medications. Continues to get her medications from the outpatient pharmacy at [facility]. Is happy they have her medications in stock.  Reviewed prescribed diet with patient heart healthy/ADA diet. The patient is doing well and says that she has lost weight. She is eating but not snacking like she was. She will drink boost every now and then to help with protein intake.  She in addition to the [facility] is taking "Golo". She states that she feels the best she has felt in a really long time. Education and support given.  Counseled on importance of regular laboratory monitoring as prescribed. Labs are up to date. Denies any acute findings. A1C is stable and trending down.;        Discussed plans with patient for ongoing care management follow up and provided patient with direct contact information for care management team;      Provided patient with written educational materials related to hypo and hyperglycemia and importance of correct treatment. Review of how to effectively manage drops in blood sugars or highs. The patient states the [facility] is really helpful and alerts her when her blood sugars is high or  low. Continues to use the freestyle [place]. Saw a blood pressure of 300 yesterday but it came back down. She feels this was due to the nerve block procedure she had recently.     Reviewed scheduled/upcoming provider appointments including: Saw the pcp on 09-04-2022, sees on a regular basis.  Advised patient, providing education and rationale, to check cbg when you have symptoms of low or high blood sugar and has freestyle libre  and  record. States her blood sugars are really good. Her blood sugars  have been up and down. She states that she got the steroid nerve block recently and it has been elevated some. Today it is better and at the time of the call her reading was 120. call provider for findings outside established parameters;       Review of patient status, including review of consultants reports, relevant laboratory and other test results, and medications completed;       Advised patient to discuss changes in DM and diabetes health with provider;      Screening for signs and symptoms of depression related to chronic disease state;        Assessed social determinant of health barriers;     The patient states she feels the best she has felt in a really long time. She says she is able to do things she has not been able to do in a long time. She will see the specialist on Thursday that did the nerve block. Education and support given.      Symptom Management: Take medications as prescribed   Attend all scheduled provider appointments Call pharmacy for medication refills 3-7 days in advance of running out of medications Call provider office for new concerns or questions  call the Suicide and Crisis Lifeline: 988 call the [place] National Suicide Prevention Lifeline: (312)156-8374 or TTY: 534 076 8051 TTY [phone]) to talk to a trained counselor call 1-800-273-TALK (toll free, 24 hour hotline) if experiencing a Mental Health or Behavioral Health Crisis  check feet daily for cuts, sores or redness trim toenails straight across manage portion size wash and dry feet carefully every day wear comfortable, cotton socks wear comfortable, well-fitting shoes  Follow Up Plan: Telephone follow up appointment with care management team member scheduled for: 11-27-2022 at 1030 am       CCM Expected Outcome:  Monitor, Self-Manage and Reduce Symptoms of  breast cancer       Current Barriers:  Chronic Disease Management support  and education needs related to effective management of breast cancer  Planned Interventions: Assessed patient understanding of cancer diagnosis and recommended treatment plan. Denies any new concerns with her breast cancer health and well being. Sees specialist on a regular basis. Education provided Reviewed upcoming provider appointments and treatment appointments. Saw the pcp on 11-27-2022 at 1030 am Assessed available transportation to appointments and treatments. Has consistent/reliable transportation: Yes Assessed support system. Has consistent/reliable family or other support: Yes Nutrition assessment performed The patient saw the oncologist yesterday and current treatment plan with Arimidex is effective. The patient states there were  no changes in the plan of care for her breast cancer treatment plan. Follows up accordingly with the oncologist.  Denies any new concerns related to her history of breast cancer   Symptom Management: Take medications as prescribed   Attend all scheduled provider appointments Call pharmacy for medication refills 3-7 days in advance of running out of medications Call provider office for new concerns or questions  call the Suicide and  Crisis Lifeline: 988 call the [place] National Suicide Prevention Lifeline: 843-494-4063 or TTY: 7634595122 Gwenevere Abbot [phone]) to talk to a trained counselor call 1-800-273-TALK (toll free, 24 hour hotline) if experiencing a Mental Health or Behavioral Health Crisis   Follow Up Plan: Telephone follow up appointment with care management team member scheduled for: 11-27-2022 at 1030 am       CCM Expected Outcome:  Monitor, Self-Manage and Reduce Symptoms of CAD       Current Barriers:  Chronic Disease Management support and education needs related to effective management of CAD BP Readings from Last 3 Encounters:  09/19/22 133/73  09/04/22 104/67  08/21/22 (!) 105/40     Planned Interventions: Assessed understanding of CAD  diagnosis. The patient states that she is much better and having a good day today. She states she feels the best she has felt in a really long time. Her CAD is stable and Her blood pressures are good. She will se the pain specialist on Thursday that did her recent nerve block and she will talk to the specialist about the stents in her legs. It has been 10 years and she feels it is time for them to be rechecked due to cramps she has.  Medications reviewed including medications utilized in CAD treatment plan. Is compliant with medications. Denies any new concerns with medications.  Provided education on importance of blood pressure control in management of CAD. Her blood pressures are stable. She denies any acute findings with changes in her blood pressures. ; Provided education on Importance of limiting foods high in cholesterol. Has lost weight, eats healthy, does not eat between meals.; Counseled on importance of regular laboratory monitoring as prescribed. The patient has regular lab work.; Reviewed Importance of taking all medications as prescribed. Takes medications as prescribed.  Reviewed Importance of attending all scheduled provider appointments. Sees the pcp on a regular basis. Last saw pcp on 09-04-2022 Advised to report any changes in symptoms or exercise tolerance Advised patient to discuss changes in CAD or heart health with provider; Screening for signs and symptoms of depression related to chronic disease state;  Assessed social determinant of health barriers;   Symptom Management: Take medications as prescribed   Attend all scheduled provider appointments Call provider office for new concerns or questions  call the Suicide and Crisis Lifeline: 988 call the [place] National Suicide Prevention Lifeline: 9301320598 or TTY: (309) 687-3013 TTY [phone]) to talk to a trained counselor call 1-800-273-TALK (toll free, 24 hour hotline) if experiencing a Mental Health or Behavioral Health  Crisis   Follow Up Plan: Telephone follow up appointment with care management team member scheduled for: 11-27-2022 at 1030 am       CCM Expected Outcome:  Monitor, Self-Manage and Reduce Symptoms of: Falls prevention and safety       Current Barriers:  Care Coordination needs related to falls prevention and safety in a patient with  high fall risk in a patient with recent falls with injury 2 weeks ago Chronic Disease Management support and education needs related to falls and safety prevention  Planned Interventions: Provided written and verbal education re: potential causes of falls and Fall prevention strategies. The patient feels the best she has in a really long time. She denies any acute changes and is not having to use her cane or walker as much.  Reviewed medications and discussed potential side effects of medications such as dizziness and frequent urination. Medications reviewed. No new issues with medications Advised patient of importance  of notifying provider of falls. Education on calling the office for new falls or safety concerns Assessed for signs and symptoms of orthostatic hypotension Assessed for falls since last encounter. Last fall was in October. The patient denies any new falls. Review of fall and safety prevention. The patient has had broken ribs from the fall and when she had pneumonia in December/January she feels this has aggravated the area. She is doing well. No new falls.  Assessed patients knowledge of fall risk prevention secondary to previously provided education. The patient is doing better.She feels the best she has felt in a really long time. She denies any acute changes.  Advised patient to discuss new falls when they occur, changes in conditions that create increased risk for falls and safety concerns with provider Screening for signs and symptoms of depression related to chronic disease state Assessed social determinant of health barriers  Symptom  Management: Take medications as prescribed   Attend all scheduled provider appointments Call provider office for new concerns or questions  Work with the social worker to address care coordination needs and will continue to work with the clinical team to address health care and disease management related needs call the Suicide and Crisis Lifeline: 988 call the [place] National Suicide Prevention Lifeline: 7138200851 or TTY: 514 436 5162 TTY [phone]) to talk to a trained counselor call 1-800-273-TALK (toll free, 24 hour hotline) if experiencing a Mental Health or Behavioral Health Crisis   Follow Up Plan: Telephone follow up appointment with care management team member scheduled for: 11-27-2022 at 1030 am          Patient verbalizes understanding of instructions and care plan provided today and agrees to view in MyChart. Active MyChart status and patient understanding of how to access instructions and care plan via MyChart confirmed with patient.  Telephone follow up appointment with care management team member scheduled for: 11-27-2022 at 1030 am

## 2022-10-03 DIAGNOSIS — E119 Type 2 diabetes mellitus without complications: Secondary | ICD-10-CM | POA: Diagnosis not present

## 2022-10-03 LAB — HM DIABETES EYE EXAM

## 2022-10-04 NOTE — Progress Notes (Signed)
PROVIDER NOTE: Information contained herein reflects review and annotations entered in association with encounter. Interpretation of such information and data should be left to medically-trained personnel. Information provided to patient can be located elsewhere in the medical record under "Patient Instructions". Document created using STT-dictation technology, any transcriptional errors that may result from process are unintentional.    Patient: Tammy [name]  Service Category: E/M  Provider: Oswaldo Done, MD  DOB: [birth year]  DOS: 10/05/2022  Referring Provider: Dorcas Carrow, DO  MRN: [id number]  Specialty: Interventional Pain Management  PCP: Dorcas Carrow, DO  Type: Established Patient  Setting: Ambulatory outpatient    Location: Office  Delivery: Face-to-face     HPI  [name], a 77 y.o. year old female, is here today because of her No primary diagnosis found.. Ms. Mahn primary complain today is No chief complaint on file.  Pertinent problems: Ms. Simerson has History of back surgery; Intermittent left-sided chest pain; Neuralgia neuritis, sciatic nerve; Herniated nucleus pulposus; Chronic low back pain (1ry area of Pain) (Bilateral) (R>L) w/o sciatica; Gait abnormality; Chronic knee pain (Right); Carcinoma of lower-inner quadrant of left breast in female, estrogen receptor positive (HCC); Left breast cancer s/p mastectomy; History of total knee arthroplasty; Malignant tumor of breast (HCC); Cervical radiculopathy; Sacroiliac inflammation (HCC); Osteoarthritis of spine with radiculopathy, cervical region; Osteoarthritis of spine with radiculopathy, thoracic region; Primary cancer of left upper lobe of lung (HCC); Chronic pain syndrome; Grade 1 Anterolisthesis of cervical spine (C3/C4, C4/C5); Abnormal MRI, cervical spine (11/16/2021); Cervical spinal stenosis (C5-6, C6-7); Cervical foraminal stenosis; Cervical facet arthropathy; Cervical facet hypertrophy (Multilevel); Abnormal x-ray of  cervical spine (10/01/2020); Osteoarthritis of AC (acromioclavicular) joint (Left); Osteoarthritis of glenohumeral joint (Left); Osteoarthritis of shoulder (Left); Neurostimulator device in situ (T8-9 leads); Grade 1 Anterolisthesis of lumbar spine (L4/L5); Grade 1 Retrolisthesis of lumbar spine (L2/L3, L3/L4); DDD (degenerative disc disease), cervical; DDD (degenerative disc disease), lumbar; Abnormal MRI, lumbar spine (04/17/2017); History of lumbar spinal fusion; Failed back surgical syndrome; Abnormal CT scan, lumbar spine (02/14/2019); Osteoarthritis of knee (Right); Osteoarthritis of knee (Left); Chronic knee pain (3ry area of Pain) (Bilateral) (R>L); Coccygodynia; Chronic lower extremity pain (2ry area of Pain) (Right); Leg cramps; Chronic knee pain s/p TKR (Right); Chronic neck pain (4th area of Pain) (Bilateral) (R>L); Spinal cord stimulator dysfunction, sequela; Osteoarthritis of knees (Bilateral); Lumbar facet joint pain; Fixation hardware in spine; Lumbar facet joint syndrome (Bilateral); and Lumbar facet arthropathy (Multilevel) (Bilateral) on their pertinent problem list. Pain Assessment: Severity of   is reported as a  /10. Location:    / . Onset:  . Quality:  . Timing:  . Modifying factor(s):  [name] Vitals:  vitals were not taken for this visit.  BMI: Estimated body mass index is 29.86 kg/m as calculated from the following:   Height as of 09/19/22: 5\' 6"  (1.676 m).   Weight as of 09/19/22: 185 lb (83.9 kg). Last encounter: 08/21/2022. Last procedure: 09/19/2022.  Reason for encounter: post-procedure evaluation and assessment. ***  Post-procedure evaluation   Procedure: Lumbar Facet, Medial Branch Block(s) #1 + Medtronic spinal cord stimulator analysis and programming Laterality: Bilateral  Level: L2, L3, L4, L5, and S1 Medial Branch Level(s). Injecting these levels blocks the L3-4, L4-5, and L5-S1 lumbar facet joints. Imaging: Fluoroscopic guidance         Anesthesia: Local anesthesia (1-2%  Lidocaine) Anxiolysis: IV Versed 2.0 mg Sedation: Moderate Sedation None required. No Fentanyl administered.  DOS: 09/19/2022 Performed by: Oswaldo Done, MD  Primary Purpose: Diagnostic/Therapeutic Indications: Low back pain severe enough to impact quality of life or function. 1. Chronic low back pain (1ry area of Pain) (Bilateral) (R>L) w/o sciatica   2. Grade 1 Retrolisthesis of lumbar spine (L2/L3, L3/L4)   3. Grade 1 Anterolisthesis of lumbar spine (L4/L5)   4. Lumbar facet joint pain   5. DDD (degenerative disc disease), lumbar   6. Failed back surgical syndrome   7. Fixation hardware in spine   8. History of lumbar spinal fusion   9. Lumbar facet joint syndrome (Bilateral)   10. Neurostimulator device in situ (T8-9 leads)   11. Abnormal CT scan, lumbar spine (02/14/2019)   12. Lumbar facet arthropathy (Multilevel) (Bilateral)   13. Chronic anticoagulation (Eliquis)    NAS-11 Pain score:   Pre-procedure: 8 /10   Post-procedure: 0-No pain/10      Effectiveness:  Initial hour after procedure:   ***. Subsequent 4-6 hours post-procedure:   ***. Analgesia past initial 6 hours:   ***. Ongoing improvement:  Analgesic:  *** Function:    ***    ROM:    ***     Pharmacotherapy Assessment  Analgesic: No chronic opioid analgesics therapy prescribed by our practice. None MME/day: 0 mg/day   Monitoring: [place] PMP: PDMP reviewed during this encounter.       Pharmacotherapy: No side-effects or adverse reactions reported. Compliance: No problems identified. Effectiveness: Clinically acceptable.  No notes on file  No results found for: "CBDTHCR" No results found for: "D8THCCBX" No results found for: "D9THCCBX"  UDS:  Summary  Date Value Ref Range Status  06/28/2022 Note  Final    Comment:    ==================================================================== Compliance Drug Analysis, Ur ==================================================================== Test                              Result       Flag       Units  Drug Present and Declared for Prescription Verification   Gabapentin                     PRESENT      EXPECTED   Duloxetine                     PRESENT      EXPECTED   Acetaminophen                  PRESENT      EXPECTED   Metoprolol                     PRESENT      EXPECTED ==================================================================== Test                      Result    Flag   Units      Ref Range   Creatinine              133              mg/dL      >=25 ==================================================================== Declared Medications:  The flagging and interpretation on this report are based on the  following declared medications.  Unexpected results may arise from  inaccuracies in the declared medications.   **Note: The testing scope of this panel includes these medications:   Duloxetine (Cymbalta)  Gabapentin (Neurontin)  Metoprolol (Lopressor)   **  Note: The testing scope of this panel does not include small to  moderate amounts of these reported medications:   Acetaminophen (Tylenol)   **Note: The testing scope of this panel does not include the  following reported medications:   Albuterol  Anastrozole (Arimidex)  Apixaban (Eliquis)  Calcium  Fluticasone (Flonase)  Fluticasone (Trelegy)  Glipizide (Glucotrol)  Insulin Evaristo Bury)  Isosorbide (Imdur)  Levothyroxine (Synthroid)  Mirabegron (Myrbetriq)  Montelukast (Singulair)  Nitroglycerin (Nitrostat)  Ondansetron (Zofran)  Pantoprazole (Protonix)  Rosuvastatin (Crestor)  Supplement  Tirzepatide (Mounjaro)  Trimethoprim  Umeclidinium (Trelegy)  Vilanterol (Trelegy)  Vitamin D  Vitamin D3  Zinc ==================================================================== For clinical consultation, please call [phone]. ====================================================================       ROS  Constitutional: Denies any fever or  chills Gastrointestinal: No reported hemesis, hematochezia, vomiting, or acute GI distress Musculoskeletal: Denies any acute onset joint swelling, redness, loss of ROM, or weakness Neurological: No reported episodes of acute onset apraxia, aphasia, dysarthria, agnosia, amnesia, paralysis, loss of coordination, or loss of consciousness  Medication Review  Calcium Carb-Cholecalciferol, Cranberry, DULoxetine, Fluticasone-Umeclidin-Vilant, FreeStyle Libre 2 Reader, [employer] 2 Sensor, Pumpkin Seed-Soy Germ, UNABLE TO FIND, Zinc Citrate, acetaminophen, albuterol, anastrozole, apixaban, cefUROXime, cholecalciferol, fluticasone, gabapentin, glipiZIDE, isosorbide mononitrate, levothyroxine, metoprolol tartrate, mirabegron ER, montelukast, nitroGLYCERIN, pantoprazole, rosuvastatin, tirzepatide, and trimethoprim  History Review  Allergy: Ms. Chirico is allergic to hydrocodone, aleve [naproxen sodium], contrast media [iodinated contrast media], ioxaglate, oxycodone, ranexa [ranolazine], sulfa antibiotics, and tramadol. Drug: Ms. Ackerley  reports no history of drug use. Alcohol:  reports no history of alcohol use. Tobacco:  reports that she quit smoking about 15 years ago. Her smoking use included cigarettes. She started smoking about 30 years ago. She has a 7.5 pack-year smoking history. She has never used smokeless tobacco. Social: Ms. Beavers  reports that she quit smoking about 15 years ago. Her smoking use included cigarettes. She started smoking about 30 years ago. She has a 7.5 pack-year smoking history. She has never used smokeless tobacco. She reports that she does not drink alcohol and does not use drugs. Medical:  has a past medical history of Asthma, Benign neoplasm of colon, Breast cancer (HCC) (08/2018), Cervical disc disease, Chronic diastolic CHF (congestive heart failure) (HCC), CKD (chronic kidney disease), stage III (HCC), COPD (chronic obstructive pulmonary disease) (HCC), Coronary artery disease,  Diabetes mellitus without complication (HCC), Diagnosis unknown (05/07/2022), Fusion of lumbar spine (12/22/2015), GERD (gastroesophageal reflux disease), History of DVT (deep vein thrombosis), History of tobacco abuse, Hyperlipidemia, Hypertension, Hypothyroidism, Lung cancer (HCC), Malignant neoplasm of unspecified site of right female breast (HCC) (03/14/1999), Obesity, Palpitations, Personal history of radiation therapy, PONV (postoperative nausea and vomiting), Pulmonary embolus (HCC) (12/25/2013), PVD (peripheral vascular disease) (HCC), and Stroke (HCC) (2015). Surgical: Ms. Rollyson  has a past surgical history that includes Angioplasty / stenting femoral; Trigger finger release; Thyroidectomy; Cholecystectomy; Cardiac catheterization (12/07/2011); Cardiac catheterization (01/04/2011); Colonoscopy (2013); laparoscopic appendectomy (N/A, 01/06/2015); Appendectomy (12/2014); Colonoscopy with propofol (N/A, 05/24/2015); polypectomy (05/24/2015); Mastectomy w/ sentinel node biopsy (Left, 08/26/2018); Breast surgery; Diagnostic laparoscopy; Colonoscopy with propofol (N/A, 01/28/2019); Esophagogastroduodenoscopy (egd) with propofol (N/A, 01/28/2019); Back surgery (03/2012); Back surgery; Back surgery (11/2016); LEFT HEART CATH AND CORONARY ANGIOGRAPHY (Left, 04/04/2019); Total knee arthroplasty (Right, 11/13/2019); Breast lumpectomy (Left, 08/26/2018); Replacement total knee (Right); RIGHT/LEFT HEART CATH AND CORONARY ANGIOGRAPHY (Bilateral, 09/26/2021); and Cataract extraction (Right, 12/21/2021). Family: family history includes COPD in her sister and sister; Cancer in her mother and sister; Colon cancer (age of onset: 90) in her brother; Diabetes in  her brother, brother, brother, and father; Heart attack in her brother, brother, brother, and father; Heart disease in her brother, brother, brother, and father; Hypertension in her brother, brother, brother, and father; Lung cancer in her brother.  Laboratory  Chemistry Profile   Renal Lab Results  Component Value Date   BUN 33 (H) 09/04/2022   CREATININE 1.52 (H) 09/04/2022   BCR 22 09/04/2022   GFRAA 32 (L) 02/10/2020   GFRNONAA 37 (L) 08/01/2022    Hepatic Lab Results  Component Value Date   AST 20 09/04/2022   ALT 17 09/04/2022   ALBUMIN 4.3 09/04/2022   ALKPHOS 89 09/04/2022   LIPASE 62 (H) 06/08/2021    Electrolytes Lab Results  Component Value Date   NA 139 09/04/2022   K 5.1 09/04/2022   CL 103 09/04/2022   CALCIUM 9.7 09/04/2022   MG 2.2 06/28/2022   PHOS 2.6 03/10/2022    Bone Lab Results  Component Value Date   VD25OH 42.0 09/04/2022   25OHVITD1 42 06/28/2022   25OHVITD2 <1.0 06/28/2022   25OHVITD3 41 06/28/2022    Inflammation (CRP: Acute Phase) (ESR: Chronic Phase) Lab Results  Component Value Date   CRP 5 06/28/2022   ESRSEDRATE 26 06/28/2022   LATICACIDVEN 0.9 03/08/2022         Note: Above Lab results reviewed.  Recent Imaging Review  DG PAIN CLINIC C-ARM 1-60 MIN NO REPORT Fluoro was used, but no Radiologist interpretation will be provided.  Please refer to "NOTES" tab for provider progress note. Note: Reviewed        Physical Exam  General appearance: Well nourished, well developed, and well hydrated. In no apparent acute distress Mental status: Alert, oriented x 3 (person, place, & time)       Respiratory: No evidence of acute respiratory distress Eyes: PERLA Vitals: There were no vitals taken for this visit. BMI: Estimated body mass index is 29.86 kg/m as calculated from the following:   Height as of 09/19/22: 5\' 6"  (1.676 m).   Weight as of 09/19/22: 185 lb (83.9 kg). Ideal: Patient weight not recorded  Assessment   Diagnosis Status  No diagnosis found. Controlled Controlled Controlled   Updated Problems: No problems updated.  Plan of Care  Problem-specific:  No problem-specific Assessment & Plan notes found for this encounter.  Ms. TIFANY HIRSCH has a current medication list  which includes the following long-term medication(s): albuterol, albuterol, apixaban, calcium 600 + d, duloxetine, fluticasone, gabapentin, glipizide, isosorbide mononitrate, levothyroxine, metoprolol tartrate, montelukast, myrbetriq, nitroglycerin, pantoprazole, and rosuvastatin.  Pharmacotherapy (Medications Ordered): No orders of the defined types were placed in this encounter.  Orders:  No orders of the defined types were placed in this encounter.  Follow-up plan:   No follow-ups on file.      Interventional Therapies  Risk Factors  Considerations:  Eliquis Anticoagulation: (Stop: 3 days  Restart: 6 hours)   Allergy: CONTRAST Dye, Iodine, Hydrocodone, Oxycodone, Tramadol, Naproxen (Aleve)  T2IDDM  PVD w/ LE stents  CAD  Unstable Angina  HTN  PVD  COPD  Emphysema  Lung Cancer  CKD Stage3b  Hx. Stroke  Hx. PE  Hx. DVT  GERD       Normal. Non-continuous. NO RFA 2ry to HARDWARE    Planned  Pending:   Diagnostic bilateral lumbar MBB (L2-S1) #1 (09/19/2022)  Medtronics spinal cord stimulator adjustment (analysis and programming) (09/19/2022)    Under consideration:   Diagnostic right genicular nerve block #1    Completed:  None at this time   Therapeutic  Palliative (PRN) options:   Unknown ancillary procedure (04/05/2021) by Merri Ray, DO [facility])  Permanent spinal cord stimulator implant by Rockwell Germany, MD ([facility] surgeon)    Completed by other providers:   (06/22/2017) bilateral lower extremity EMG/PNCV (normal) by [facility]  Spinal cord stimulator implant        Recent Visits Date Type Provider Dept  09/19/22 Procedure visit Delano Metz, MD [facility]  08/21/22 Office Visit Delano Metz, MD [facility]  07/12/22 Office Visit Delano Metz, MD [facility]  Showing recent visits within past 90 days and meeting all other requirements Future Appointments Date Type Provider  Dept  10/05/22 Appointment Delano Metz, MD [facility]  Showing future appointments within next 90 days and meeting all other requirements  I discussed the assessment and treatment plan with the patient. The patient was provided an opportunity to ask questions and all were answered. The patient agreed with the plan and demonstrated an understanding of the instructions.  Patient advised to call back or seek an in-person evaluation if the symptoms or condition worsens.  Duration of encounter: *** minutes.  Total time on encounter, as per AMA guidelines included both the face-to-face and non-face-to-face time personally spent by the physician and/or other qualified health care professional(s) on the day of the encounter (includes time in activities that require the physician or other qualified health care professional and does not include time in activities normally performed by clinical staff). Physician's time may include the following activities when performed: Preparing to see the patient (e.g., pre-charting review of records, searching for previously ordered imaging, lab work, and nerve conduction tests) Review of prior analgesic pharmacotherapies. Reviewing PMP Interpreting ordered tests (e.g., lab work, imaging, nerve conduction tests) Performing post-procedure evaluations, including interpretation of diagnostic procedures Obtaining and/or reviewing separately obtained history Performing a medically appropriate examination and/or evaluation Counseling and educating the patient/family/caregiver Ordering medications, tests, or procedures Referring and communicating with other health care professionals (when not separately reported) Documenting clinical information in the electronic or other health record Independently interpreting results (not separately reported) and communicating results to the patient/ family/caregiver Care coordination (not separately reported)  Note  by: Oswaldo Done, MD Date: 10/05/2022; Time: 11:09 AM

## 2022-10-05 ENCOUNTER — Ambulatory Visit: Payer: Medicare Other | Attending: Pain Medicine | Admitting: Pain Medicine

## 2022-10-05 ENCOUNTER — Encounter: Payer: Self-pay | Admitting: Pain Medicine

## 2022-10-05 VITALS — BP 121/64 | HR 76 | Temp 97.3°F | Resp 16 | Ht 66.0 in | Wt 185.0 lb

## 2022-10-05 DIAGNOSIS — M47816 Spondylosis without myelopathy or radiculopathy, lumbar region: Secondary | ICD-10-CM | POA: Insufficient documentation

## 2022-10-05 DIAGNOSIS — M961 Postlaminectomy syndrome, not elsewhere classified: Secondary | ICD-10-CM | POA: Diagnosis not present

## 2022-10-05 DIAGNOSIS — M5459 Other low back pain: Secondary | ICD-10-CM | POA: Diagnosis not present

## 2022-10-05 DIAGNOSIS — G8929 Other chronic pain: Secondary | ICD-10-CM | POA: Insufficient documentation

## 2022-10-05 DIAGNOSIS — Z09 Encounter for follow-up examination after completed treatment for conditions other than malignant neoplasm: Secondary | ICD-10-CM | POA: Insufficient documentation

## 2022-10-05 DIAGNOSIS — M545 Low back pain, unspecified: Secondary | ICD-10-CM | POA: Diagnosis not present

## 2022-10-05 DIAGNOSIS — Z967 Presence of other bone and tendon implants: Secondary | ICD-10-CM | POA: Insufficient documentation

## 2022-10-05 NOTE — Progress Notes (Signed)
Safety precautions to be maintained throughout the outpatient stay will include: orient to surroundings, keep bed in low position, maintain call bell within reach at all times, provide assistance with transfer out of bed and ambulation.  

## 2022-10-05 NOTE — Patient Instructions (Signed)
OTC Recommendations: Consider taking over-the-counter supplements such as: Turmeric/curcumin*(anti-inflammatory) Glucosamine/chondroitin (triple strength)*(may help prevent loss of articular cartilage) Vitamin D*(may have the ability to suppress release of chemicals associated with inflammation) Moringa*(anti-inflammatory with mild analgesic effects) (*Always use manufacturer's recommended dosage.)

## 2022-10-11 DIAGNOSIS — E1159 Type 2 diabetes mellitus with other circulatory complications: Secondary | ICD-10-CM

## 2022-10-11 DIAGNOSIS — C50312 Malignant neoplasm of lower-inner quadrant of left female breast: Secondary | ICD-10-CM | POA: Diagnosis not present

## 2022-10-11 DIAGNOSIS — Z170 Estrogen receptor positive status [ER+]: Secondary | ICD-10-CM | POA: Diagnosis not present

## 2022-10-11 DIAGNOSIS — Z7984 Long term (current) use of oral hypoglycemic drugs: Secondary | ICD-10-CM | POA: Diagnosis not present

## 2022-10-11 DIAGNOSIS — I2510 Atherosclerotic heart disease of native coronary artery without angina pectoris: Secondary | ICD-10-CM | POA: Diagnosis not present

## 2022-10-18 ENCOUNTER — Telehealth (INDEPENDENT_AMBULATORY_CARE_PROVIDER_SITE_OTHER): Payer: Self-pay

## 2022-10-18 NOTE — Telephone Encounter (Signed)
Spoke with the patient and she is scheduled with Dr. Wyn Quaker for a LLE angio with a 11:30 am arrival time to the [facility]. Pre-procedure instructions were discussed and will be sent to Mychart and mailed.

## 2022-10-23 ENCOUNTER — Other Ambulatory Visit: Payer: Self-pay

## 2022-10-23 ENCOUNTER — Ambulatory Visit
Admission: RE | Admit: 2022-10-23 | Discharge: 2022-10-23 | Disposition: A | Discharge status: Home / Self Care | Payer: Medicare Other | Source: Home / Self Care | Attending: Vascular Surgery | Admitting: Vascular Surgery

## 2022-10-23 ENCOUNTER — Encounter
Admission: RE | Disposition: A | Discharge status: Home / Self Care | Payer: Self-pay | Source: Home / Self Care | Attending: Vascular Surgery

## 2022-10-23 ENCOUNTER — Encounter: Payer: Self-pay | Admitting: Family Medicine

## 2022-10-23 DIAGNOSIS — I130 Hypertensive heart and chronic kidney disease with heart failure and stage 1 through stage 4 chronic kidney disease, or unspecified chronic kidney disease: Secondary | ICD-10-CM | POA: Diagnosis not present

## 2022-10-23 DIAGNOSIS — Z87891 Personal history of nicotine dependence: Secondary | ICD-10-CM | POA: Diagnosis not present

## 2022-10-23 DIAGNOSIS — E785 Hyperlipidemia, unspecified: Secondary | ICD-10-CM | POA: Insufficient documentation

## 2022-10-23 DIAGNOSIS — I7789 Other specified disorders of arteries and arterioles: Secondary | ICD-10-CM | POA: Diagnosis not present

## 2022-10-23 DIAGNOSIS — N1832 Chronic kidney disease, stage 3b: Secondary | ICD-10-CM | POA: Diagnosis not present

## 2022-10-23 DIAGNOSIS — Z96651 Presence of right artificial knee joint: Secondary | ICD-10-CM

## 2022-10-23 DIAGNOSIS — E1122 Type 2 diabetes mellitus with diabetic chronic kidney disease: Secondary | ICD-10-CM | POA: Diagnosis not present

## 2022-10-23 DIAGNOSIS — Z981 Arthrodesis status: Secondary | ICD-10-CM | POA: Diagnosis not present

## 2022-10-23 DIAGNOSIS — E1165 Type 2 diabetes mellitus with hyperglycemia: Secondary | ICD-10-CM | POA: Diagnosis not present

## 2022-10-23 DIAGNOSIS — I77819 Aortic ectasia, unspecified site: Secondary | ICD-10-CM

## 2022-10-23 DIAGNOSIS — E1151 Type 2 diabetes mellitus with diabetic peripheral angiopathy without gangrene: Secondary | ICD-10-CM | POA: Insufficient documentation

## 2022-10-23 DIAGNOSIS — I70213 Atherosclerosis of native arteries of extremities with intermittent claudication, bilateral legs: Secondary | ICD-10-CM | POA: Insufficient documentation

## 2022-10-23 DIAGNOSIS — I5032 Chronic diastolic (congestive) heart failure: Secondary | ICD-10-CM | POA: Diagnosis not present

## 2022-10-23 DIAGNOSIS — I70219 Atherosclerosis of native arteries of extremities with intermittent claudication, unspecified extremity: Secondary | ICD-10-CM

## 2022-10-23 HISTORY — PX: LOWER EXTREMITY INTERVENTION: CATH118252

## 2022-10-23 LAB — GLUCOSE, CAPILLARY
Glucose-Capillary: 121 mg/dL — ABNORMAL HIGH (ref 70–99)
Glucose-Capillary: 87 mg/dL (ref 70–99)
Glucose-Capillary: 90 mg/dL (ref 70–99)

## 2022-10-23 LAB — CREATININE, SERUM
Creatinine, Ser: 1.63 mg/dL — ABNORMAL HIGH (ref 0.44–1.00)
GFR, Estimated: 32 mL/min — ABNORMAL LOW (ref >60)

## 2022-10-23 LAB — BUN: BUN: 52 mg/dL — ABNORMAL HIGH (ref 8–23)

## 2022-10-23 SURGERY — LOWER EXTREMITY INTERVENTION
Anesthesia: Moderate Sedation | Laterality: Right

## 2022-10-23 MED ORDER — SODIUM CHLORIDE 0.9 % IV BOLUS: 250.0000 mL | Freq: Once | INTRAVENOUS | Status: DC

## 2022-10-23 MED ORDER — HEPARIN SODIUM (PORCINE) 1000 UNIT/ML IJ SOLN
INTRAMUSCULAR | Status: AC
  Filled 2022-10-23: qty 10

## 2022-10-23 MED ORDER — SODIUM CHLORIDE 0.9 % IV SOLN: Freq: Once | INTRAVENOUS | Status: DC

## 2022-10-23 MED ORDER — METHYLPREDNISOLONE SODIUM SUCC 125 MG IJ SOLR: 125.0000 mg | Freq: Once | INTRAMUSCULAR | Status: DC | PRN

## 2022-10-23 MED ORDER — ONDANSETRON HCL 4 MG/2ML IJ SOLN: 4.0000 mg | Freq: Four times a day (QID) | INTRAMUSCULAR | Status: DC | PRN

## 2022-10-23 MED ORDER — SODIUM CHLORIDE 0.9 % IV SOLN: INTRAVENOUS | Status: DC

## 2022-10-23 MED ORDER — ASPIRIN 81 MG PO TBEC: 81.0000 mg | DELAYED_RELEASE_TABLET | Freq: Every day | ORAL | Status: DC

## 2022-10-23 MED ORDER — FAMOTIDINE 20 MG PO TABS: 40.0000 mg | ORAL_TABLET | Freq: Once | ORAL | Status: DC | PRN

## 2022-10-23 MED ORDER — SODIUM CHLORIDE 0.9% FLUSH: 3.0000 mL | Freq: Two times a day (BID) | INTRAVENOUS | Status: DC

## 2022-10-23 MED ORDER — ACETAMINOPHEN 325 MG PO TABS: 650.0000 mg | ORAL_TABLET | ORAL | Status: DC | PRN

## 2022-10-23 MED ORDER — DIPHENHYDRAMINE HCL 50 MG/ML IJ SOLN
INTRAMUSCULAR | Status: AC
  Filled 2022-10-23: qty 1

## 2022-10-23 MED ORDER — SODIUM CHLORIDE 0.9 % IV SOLN: 250.0000 mL | INTRAVENOUS | Status: DC | PRN

## 2022-10-23 MED ORDER — MIDAZOLAM HCL 2 MG/2ML IJ SOLN
INTRAMUSCULAR | Status: AC
  Filled 2022-10-23: qty 2

## 2022-10-23 MED ORDER — IODIXANOL 320 MG/ML IV SOLN
INTRAVENOUS | Status: DC | PRN
  Administered 2022-10-23: 55 mL

## 2022-10-23 MED ORDER — CEFAZOLIN SODIUM-DEXTROSE 2-4 GM/100ML-% IV SOLN
2.0000 g | INTRAVENOUS | Status: AC
  Administered 2022-10-23: 2 g via INTRAVENOUS

## 2022-10-23 MED ORDER — SODIUM CHLORIDE 0.9% FLUSH: 3.0000 mL | INTRAVENOUS | Status: DC | PRN

## 2022-10-23 MED ORDER — FENTANYL CITRATE (PF) 100 MCG/2ML IJ SOLN
INTRAMUSCULAR | Status: DC | PRN
  Administered 2022-10-23: 50 ug via INTRAVENOUS

## 2022-10-23 MED ORDER — LIDOCAINE-EPINEPHRINE (PF) 1 %-1:200000 IJ SOLN
INTRAMUSCULAR | Status: DC | PRN
  Administered 2022-10-23: 10 mL

## 2022-10-23 MED ORDER — FENTANYL CITRATE (PF) 100 MCG/2ML IJ SOLN
INTRAMUSCULAR | Status: AC
  Filled 2022-10-23: qty 2

## 2022-10-23 MED ORDER — MIDAZOLAM HCL 2 MG/ML PO SYRP: 8.0000 mg | ORAL_SOLUTION | Freq: Once | ORAL | Status: DC | PRN

## 2022-10-23 MED ORDER — METHYLPREDNISOLONE SODIUM SUCC 125 MG IJ SOLR
INTRAMUSCULAR | Status: AC
  Filled 2022-10-23: qty 2

## 2022-10-23 MED ORDER — HYDRALAZINE HCL 20 MG/ML IJ SOLN: 5.0000 mg | INTRAMUSCULAR | Status: DC | PRN

## 2022-10-23 MED ORDER — CEFAZOLIN SODIUM-DEXTROSE 2-4 GM/100ML-% IV SOLN
INTRAVENOUS | Status: AC
  Filled 2022-10-23: qty 100

## 2022-10-23 MED ORDER — MIDAZOLAM HCL 2 MG/2ML IJ SOLN
INTRAMUSCULAR | Status: DC | PRN
  Administered 2022-10-23: 2 mg via INTRAVENOUS

## 2022-10-23 MED ORDER — LABETALOL HCL 5 MG/ML IV SOLN: 10.0000 mg | INTRAVENOUS | Status: DC | PRN

## 2022-10-23 MED ORDER — DIPHENHYDRAMINE HCL 50 MG/ML IJ SOLN: 50.0000 mg | Freq: Once | INTRAMUSCULAR | Status: DC | PRN

## 2022-10-23 MED ORDER — HEPARIN (PORCINE) IN NACL 1000-0.9 UT/500ML-% IV SOLN
INTRAVENOUS | Status: DC | PRN
  Administered 2022-10-23: 1000 mL

## 2022-10-23 MED ORDER — ASPIRIN 81 MG PO TBEC: 81.0000 mg | DELAYED_RELEASE_TABLET | Freq: Every day | ORAL | 2 refills | Status: DC

## 2022-10-23 MED ORDER — FAMOTIDINE 20 MG PO TABS
ORAL_TABLET | ORAL | Status: AC
  Filled 2022-10-23: qty 2

## 2022-10-23 SURGICAL SUPPLY — 21 items
BALLN LUTONIX 018 4X150X130 (BALLOONS) ×2
BALLN LUTONIX 018 5X220X130 (BALLOONS) ×2
BALLN LUTONIX 018 5X300X130 (BALLOONS) ×2
BALLOON LUTONIX 018 4X150X130 (BALLOONS) ×1 IMPLANT
BALLOON LUTONIX 018 5X220X130 (BALLOONS) ×1 IMPLANT
BALLOON LUTONIX 018 5X300X130 (BALLOONS) ×1 IMPLANT
CATH ANGIO 5F PIGTAIL 65CM (CATHETERS) ×1 IMPLANT
CATH VERT 5X100 (CATHETERS) ×1 IMPLANT
COVER EZ STRL 42X30 (DRAPES) ×1 IMPLANT
COVER PROBE ULTRASOUND 5X96 (MISCELLANEOUS) ×1 IMPLANT
DEVICE STARCLOSE SE CLOSURE (Vascular Products) ×1 IMPLANT
GLIDEWIRE ADV .035X260CM (WIRE) ×1 IMPLANT
KIT ENCORE 26 ADVANTAGE (KITS) ×1 IMPLANT
PACK ANGIOGRAPHY (CUSTOM PROCEDURE TRAY) ×2 IMPLANT
SHEATH ANL2 6FRX45 HC (SHEATH) ×1 IMPLANT
SHEATH BRITE TIP 5FRX11 (SHEATH) ×1 IMPLANT
STENT VIABAHN 6X250X120 (Permanent Stent) ×1 IMPLANT
SYR MEDRAD MARK 7 150ML (SYRINGE) ×1 IMPLANT
TUBING CONTRAST HIGH PRESS 72 (TUBING) ×1 IMPLANT
WIRE G V18X300CM (WIRE) ×1 IMPLANT
WIRE GUIDERIGHT .035X150 (WIRE) ×1 IMPLANT

## 2022-10-23 NOTE — H&P (Addendum)
[facility] VASCULAR & VEIN SPECIALISTS Admission History & Physical  MRN : [id number]  Tammy [name] is a 77 y.o. (Dec 16, 1945) female who presents with chief complaint of No chief complaint on file. [name]  History of Present Illness: Patient with a long history of peripheral arterial disease.  Had lower extremity interventions about a decade ago.  Had reduction in her ABIs when seen last year but has continued conservative therapy.  Now, her symptoms are bad enough on her right leg she desires to have angiography and possible revascularization.  Current Facility-Administered Medications  Medication Dose Route Frequency Provider Last Rate Last Admin   0.9 %  sodium chloride infusion   Intravenous Continuous Georgiana Spinner, NP       ceFAZolin (ANCEF) IVPB 2g/100 mL premix  2 g Intravenous 30 min Pre-Op Georgiana Spinner, NP       diphenhydrAMINE (BENADRYL) injection 50 mg  50 mg Intravenous Once PRN Georgiana Spinner, NP       famotidine (PEPCID) tablet 40 mg  40 mg Oral Once PRN Georgiana Spinner, NP       methylPREDNISolone sodium succinate (SOLU-MEDROL) 125 mg/2 mL injection 125 mg  125 mg Intravenous Once PRN Georgiana Spinner, NP       midazolam (VERSED) 2 MG/ML syrup 8 mg  8 mg Oral Once PRN Georgiana Spinner, NP       ondansetron [facility]) injection 4 mg  4 mg Intravenous Q6H PRN Georgiana Spinner, NP        Past Medical History:  Diagnosis Date   Asthma    Benign neoplasm of colon    Breast cancer (HCC) 08/2018   left breast   Cervical disc disease    "bulging" - no limitations per pt   Chronic diastolic CHF (congestive heart failure) (HCC)    a. 07/2012 Echo: Nl EF. mild inferoseptal HK, Gr 2 DD, mild conc LVH, mild PR/TR, mild PAH; b. 08/2019 Echo: EF 55-60%, no rwma, Gr1 DD. Nl RV size/fxn.   CKD (chronic kidney disease), stage III (HCC)    COPD (chronic obstructive pulmonary disease) (HCC)    Coronary artery disease    a. 11/2011 Cath: LAD 13m/d, LCX min irregs, RCA 70p, 164m with L->R collats,  EF 60%-->Med Rx; b. 03/2019 Cath: LM nl, LAD 40-65m. LCX large, mild diff dzs. OM1/2 nl. RCA known to be 163m. L-->R collats from LAD & LCX/OM.   Diabetes mellitus without complication (HCC)    Diagnosis unknown 05/07/2022   Fusion of lumbar spine 12/22/2015   GERD (gastroesophageal reflux disease)    History of DVT (deep vein thrombosis)    History of tobacco abuse    a. Quit 2011.   Hyperlipidemia    Hypertension    Hypothyroidism    Lung cancer (HCC)    Malignant neoplasm of unspecified site of right female breast (HCC) 03/14/1999   Obesity    Palpitations    Personal history of radiation therapy    PONV (postoperative nausea and vomiting)    Pulmonary embolus (HCC) 12/25/2013   RLL. Presented with SOB and elevated d-dimer.    PVD (peripheral vascular disease) (HCC)    a. PTA of right leg and left femoral artery with stenosis; b. 09/2020 ABI: R 0.93, L 0.88 - unchanged from prior study.   Stroke [facility]) 2015   TIAs - no deficits    Past Surgical History:  Procedure Laterality Date   ANGIOPLASTY / STENTING FEMORAL  PVD; angioplasty right leg and left femoral artery with stenosis.    APPENDECTOMY  12/2014   BACK SURGERY  03/2012   nerve stimulator inserted   BACK SURGERY     screws, rods replaced with new hardware.   BACK SURGERY  11/2016   BREAST LUMPECTOMY Left 08/26/2018   BREAST SURGERY     CARDIAC CATHETERIZATION  12/07/2011   Mid LAD 50%, distal LAD 50%, mid RCA 70%, distal RCA 100%    CARDIAC CATHETERIZATION  01/04/2011   100% occluded mid RCA with good collaterals from distal LAD, normal LVEF.   CATARACT EXTRACTION Right 12/21/2021   CHOLECYSTECTOMY     COLONOSCOPY  2013   COLONOSCOPY WITH PROPOFOL N/A 05/24/2015   Procedure: COLONOSCOPY WITH PROPOFOL;  Surgeon: Midge Minium, MD;  Location: [facility];  Service: Endoscopy;  Laterality: N/A;  Diabetic - oral meds   COLONOSCOPY WITH PROPOFOL N/A 01/28/2019   Procedure: COLONOSCOPY WITH PROPOFOL;  Surgeon:  Pasty Spillers, MD;  Location: [facility] ENDOSCOPY;  Service: Endoscopy;  Laterality: N/A;   DIAGNOSTIC LAPAROSCOPY     ESOPHAGOGASTRODUODENOSCOPY (EGD) WITH PROPOFOL N/A 01/28/2019   Procedure: ESOPHAGOGASTRODUODENOSCOPY (EGD) WITH PROPOFOL;  Surgeon: Pasty Spillers, MD;  Location: [facility] ENDOSCOPY;  Service: Endoscopy;  Laterality: N/A;   LAPAROSCOPIC APPENDECTOMY N/A 01/06/2015   Procedure: APPENDECTOMY LAPAROSCOPIC;  Surgeon: Kieth Brightly, MD;  Location: [facility] ORS;  Service: General;  Laterality: N/A;   LEFT HEART CATH AND CORONARY ANGIOGRAPHY Left 04/04/2019   Procedure: LEFT HEART CATH AND CORONARY ANGIOGRAPHY;  Surgeon: Antonieta Iba, MD;  Location: [facility] INVASIVE CV LAB;  Service: Cardiovascular;  Laterality: Left;   MASTECTOMY W/ SENTINEL NODE BIOPSY Left 08/26/2018   Procedure: PARTIAL MASTECTOMY WIDE EXCISION WITH SENTINEL LYMPH NODE BIOPSY LEFT;  Surgeon: Earline Mayotte, MD;  Location: [facility] ORS;  Service: General;  Laterality: Left;   POLYPECTOMY  05/24/2015   Procedure: POLYPECTOMY;  Surgeon: Midge Minium, MD;  Location: [facility]ERY [facility];  Service: Endoscopy;;   REPLACEMENT TOTAL KNEE Right    RIGHT/LEFT HEART CATH AND CORONARY ANGIOGRAPHY Bilateral 09/26/2021   Procedure: RIGHT/LEFT HEART CATH AND CORONARY ANGIOGRAPHY;  Surgeon: Iran Ouch, MD;  Location: [facility] INVASIVE CV LAB;  Service: Cardiovascular;  Laterality: Bilateral;   THYROIDECTOMY     TOTAL KNEE ARTHROPLASTY Right 11/13/2019   TRIGGER FINGER RELEASE       Social History   Tobacco Use   Smoking status: Former    Current packs/day: 0.00    Average packs/day: 0.5 packs/day for 15.0 years (7.5 ttl pk-yrs)    Types: Cigarettes    Start date: 03/13/1992    Quit date: 03/14/2007    Years since quitting: 15.6   Smokeless tobacco: Never  Vaping Use   Vaping status: Never Used  Substance Use Topics   Alcohol use: No    Alcohol/week: 0.0 standard drinks of alcohol   Drug use: Never      Family History  Problem Relation Age of Onset   Cancer Mother        colon   Hypertension Father    Heart disease Father    Heart attack Father    Diabetes Father    Cancer Sister        lung   COPD Sister    COPD Sister    Heart attack Brother    Diabetes Brother    Hypertension Brother    Heart disease Brother    Heart attack Brother    Diabetes Brother  Hypertension Brother    Heart disease Brother    Heart attack Brother    Diabetes Brother    Hypertension Brother    Heart disease Brother    Colon cancer Brother 23   Lung cancer Brother    Breast cancer Neg Hx     Allergies  Allergen Reactions   Hydrocodone Other (See Comments)    Unresponsive   Aleve [Naproxen Sodium]     Hives & itching    Contrast Media [Iodinated Contrast Media]     Pt avoids due to kidney issues   Ioxaglate Other (See Comments)    (iodine) Pt avoids due to kidney issues    Oxycodone Other (See Comments)    hallucinations   Ranexa [Ranolazine]     Sick on stomach   Sulfa Antibiotics Other (See Comments)    Pt avoids due to kidney issues   Tramadol Other (See Comments)    nightmares Other reaction(s): Hallucination, Other (See Comments) nightmares     REVIEW OF SYSTEMS (Negative unless checked)   Constitutional: [] Weight loss  [] Fever  [] Chills Cardiac: [] Chest pain   [] Chest pressure   [x] Palpitations   [] Shortness of breath when laying flat   [] Shortness of breath at rest   [] Shortness of breath with exertion. Vascular:  [] Pain in legs with walking   [] Pain in legs at rest   [] Pain in legs when laying flat   [] Claudication   [] Pain in feet when walking  [] Pain in feet at rest  [] Pain in feet when laying flat   [x] History of DVT   [x] Phlebitis   [x] Swelling in legs   [] Varicose veins   [] Non-healing ulcers Pulmonary:   [] Uses home oxygen   [] Productive cough   [] Hemoptysis   [] Wheeze  [x] COPD   [] Asthma Neurologic:  [] Dizziness  [] Blackouts   [] Seizures   [x] History of  stroke   [] History of TIA  [] Aphasia   [] Temporary blindness   [] Dysphagia   [] Weakness or numbness in arms   [x] Weakness or numbness in legs Musculoskeletal:  [x] Arthritis   [] Joint swelling   [x] Joint pain   [x] Low back pain Hematologic:  [] Easy bruising  [] Easy bleeding   [] Hypercoagulable state   [] Anemic   Gastrointestinal:  [] Blood in stool   [] Vomiting blood  [x] Gastroesophageal reflux/heartburn   [] Abdominal pain Genitourinary:  [] Chronic kidney disease   [] Difficult urination  [] Frequent urination  [] Burning with urination   [] Hematuria Skin:  [] Rashes   [] Ulcers   [] Wounds Psychological:  [] History of anxiety   []  History of major depression.  Physical Examination  There were no vitals filed for this visit. There is no height or weight on file to calculate BMI. Gen: WD/WN, NAD Head: [place]/AT, No temporalis wasting.  Ear/Nose/Throat: Hearing grossly intact, nares w/o erythema or drainage, oropharynx w/o Erythema/Exudate,  Eyes: Conjunctiva clear, sclera non-icteric Neck: Trachea midline.  No JVD.  Pulmonary:  Good air movement, respirations not labored, no use of accessory muscles.  Cardiac: RRR, normal S1, S2. Vascular:  Vessel Right Left  Radial Palpable Palpable                          PT 1+ Palpable 1+ Palpable  DP 1+ Palpable 1+ Palpable   Gastrointestinal: soft, non-tender/non-distended. No guarding/reflex.  Musculoskeletal: M/S 5/5 throughout.  Extremities without ischemic changes.  No deformity or atrophy.  Neurologic: Sensation grossly intact in extremities.  Symmetrical.  Speech is fluent. Motor exam as listed above. Psychiatric: Judgment intact,  Mood & affect appropriate for pt's clinical situation. Dermatologic: No rashes or ulcers noted.  No cellulitis or open wounds.      CBC Lab Results  Component Value Date   WBC 9.7 09/04/2022   HGB 14.0 09/04/2022   HCT 43.0 09/04/2022   MCV 86 09/04/2022   PLT 296 09/04/2022    BMET    Component Value  Date/Time   NA 139 09/04/2022 1449   NA 140 09/20/2011 1134   K 5.1 09/04/2022 1449   K 4.4 09/20/2011 1134   CL 103 09/04/2022 1449   CL 103 09/20/2011 1134   CO2 19 (L) 09/04/2022 1449   CO2 28 09/20/2011 1134   GLUCOSE 69 (L) 09/04/2022 1449   GLUCOSE 88 08/01/2022 1532   GLUCOSE 139 (H) 09/20/2011 1134   BUN 33 (H) 09/04/2022 1449   BUN 17 09/20/2011 1134   CREATININE 1.52 (H) 09/04/2022 1449   CREATININE 1.31 (H) 09/20/2011 1134   CALCIUM 9.7 09/04/2022 1449   CALCIUM 8.4 (L) 09/20/2011 1134   GFRNONAA 37 (L) 08/01/2022 1532   GFRNONAA 42 (L) 09/20/2011 1134   GFRAA 32 (L) 02/10/2020 1440   GFRAA 48 (L) 09/20/2011 1134   CrCl cannot be calculated (Patient's most recent lab result is older than the maximum 21 days allowed.).  COAG Lab Results  Component Value Date   INR 1.1 03/08/2022   INR 1.2 04/18/2020   INR 1.1 04/17/2020    Radiology No results found.   Assessment/Plan Hyperlipidemia lipid control important in reducing the progression of atherosclerotic disease. Continue statin therapy     Diabetes mellitus type 2 with complications, uncontrolled blood glucose control important in reducing the progression of atherosclerotic disease. Also, involved in wound healing. On appropriate medications.   Essential hypertension blood pressure control important in reducing the progression of atherosclerotic disease. On appropriate oral medications.   Stage 3b chronic kidney disease (HCC) Hydrate and limit contrast with procedures   Atherosclerosis of native arteries of extremity with intermittent claudication (HCC) Has developed worsening claudication symptoms since her visit last year and now desires angiogram with possible revascularization.  Right lower extremity is now bothering her more than the right.  Desires angiography and possible intervention.  Risks benefits are discussed.   Festus Barren, MD  10/23/2022 11:46 AM

## 2022-10-23 NOTE — OR Nursing (Signed)
About 15 minutes after sitting on side of bed preparing for DC.IV already removed. Pt reporting feeling extremely tired and dizzy. Vs checked Dr Wyn Quaker and Rolla Plate notified. VS BS and neuro check within normal limits. Left femoral site stable. Brien arrived at bedside. Stiing bp at 1614 125/68 Pt bp orthostatic checked 125/68/ sbp@10  mm higher than sitting bp. (134/99) pt wobbling when standing. Husband said sometimes she does this at home. Encouraged to drink fluids, rest and reassess in 10 minutes. When discussed admission pt declined at this time.

## 2022-10-23 NOTE — OR Nursing (Signed)
NS bolus 250 ml for renal status

## 2022-10-23 NOTE — Op Note (Signed)
[facility]IN SPECIAL[facility]  Percutaneous Study/Intervention Procedural Note   Date of Surgery: 10/23/2022  Surgeon(s):,    Assistants:none  Pre-operative Diagnosis: PAD with claudication bilateral lower extremities  Post-operative diagnosis:  Same  Procedure(s) Performed:             1.  Ultrasound guidance for vascular access left femoral artery             2.  Catheter placement into right common femoral artery from left femoral approach             3.  Aortogram and selective right lower extremity angiogram             4.  Percutaneous transluminal angioplasty of right tibioperoneal trunk and most proximal posterior tibial artery with 4 mm diameter Lutonix drug-coated angioplasty balloon             5.  Percutaneous transluminal angioplasty of right SFA and popliteal arteries with 5 mm diameter by 30 cm length Lutonix drug-coated angioplasty balloon  6.  Stent placement to the right SFA and most proximal popliteal artery with 6 mm diameter by 25 cm length Viabahn stent             7.  StarClose closure device left femoral artery  EBL: 10 cc  Contrast: 55 cc  Fluoro Time: 4.3 minutes  Moderate Conscious Sedation Time: approximately 45 minutes using 2 mg of Versed and 50 mcg of Fentanyl              Indications:  Patient is a 77 y.o.female with worsening claudication symptoms in both lower extremities a little worse on the right than the left. The patient has noninvasive study showing reduced ABIs bilaterally in the 0.8 range on each side with somewhat diminished waveforms. The patient is brought in for angiography for further evaluation and potential treatment.  Risks and benefits are discussed and informed consent is obtained.   Procedure:  The patient was identified and appropriate procedural time out was performed.  The patient was then placed supine on the table and prepped and draped in the usual sterile fashion. Moderate conscious sedation was administered  during a face to face encounter with the patient throughout the procedure with my supervision of the RN administering medicines and monitoring the patient's vital signs, pulse oximetry, telemetry and mental status throughout from the start of the procedure until the patient was taken to the recovery room. Ultrasound was used to evaluate the left common femoral artery.  It was patent .  A digital ultrasound image was acquired.  A Seldinger needle was used to access the left common femoral artery under direct ultrasound guidance and a permanent image was performed.  A 0.035 J wire was advanced without resistance and a 5Fr sheath was placed.  Pigtail catheter was placed into the aorta and an AP aortogram was performed. This demonstrated normal renal arteries and normal although somewhat ectatic aorta and iliac segments without significant stenosis.  Visualization was somewhat difficult due to her extensive spinal hardware.  I then crossed the aortic bifurcation and advanced to the right femoral head. Selective right lower extremity angiogram was then performed. This demonstrated fairly normal common femoral artery and profunda femoris artery.  The SFA and above-knee popliteal arteries were somewhat diffusely diseased with an area of about 70% stenosis in the mid SFA, and about 60 to 65% stenosis in the proximal SFA and at Hunter's canal.  It was somewhat difficult to opacify due  to her knee prosthesis, but the popliteal artery seemed to normalize after the stenosis at Hunter's canal.  The tibioperoneal trunk had a high-grade stenosis in the 90 to 95% range just above the split to the posterior tibial and peroneal arteries which were then patent distally with the posterior tibial artery being the dominant runoff to the foot.  The anterior tibial artery occluded in its proximal segment and did not appear to reconstitute distally. It was felt that it was in the patient's best interest to proceed with intervention after  these images to avoid a second procedure and a larger amount of contrast and fluoroscopy based off of the findings from the initial angiogram. The patient was systemically heparinized and a 6 [place] An[place] sheath was then placed over the [employer] wire. I then used a Kumpe catheter and the advantage wire to navigate through the right SFA and popliteal arteries and then down into the tibioperoneal trunk.  I then exchanged for a V18 wire and cross the stenosis in the tibioperoneal trunk and advanced into the posterior tibial artery.  The catheter was advanced in the posterior tibial artery were intraluminal flow was confirmed and then I replaced the V18 wire and remove the diagnostic catheter.  The tibioperoneal trunk was treated with a 4 mm diameter by 15 cm length Lutonix drug-coated angioplasty balloon inflated to 10 atm for 1 minute.  This was down into the most proximal posterior tibial artery distally and up into the popliteal artery proximally.  A tight waist was seen in the distal tibioperoneal trunk which resolved and completion imaging showed only about a 15 to 20% residual stenosis in the area of stenosis.  I then turned my attention to the diffusely diseased SFA.  A 5 mm diameter by 30 cm length Lutonix drug-coated angioplasty balloon was inflated to 12 atm for 1 minute from the proximal SFA down to the proximal popliteal artery to encompass all 3 lesions.  Completion imaging showed residual stenosis in multiple areas requiring stent placement.  A 6 mm diameter by 25 cm length Viabahn stent was then deployed and postdilated with a 5 mm balloon with excellent angiographic completion result and less than 10% residual stenosis. I elected to terminate the procedure. The sheath was removed and StarClose closure device was deployed in the left femoral artery with excellent hemostatic result. The patient was taken to the recovery room in stable condition having tolerated the procedure well.  Findings:                Aortogram:  This demonstrated normal renal arteries and normal although somewhat ectatic aorta and iliac segments without significant stenosis.  Visualization was somewhat difficult due to her extensive spinal hardware.             Right Lower Extremity:  This demonstrated fairly normal common femoral artery and profunda femoris artery.  The SFA and above-knee popliteal arteries were somewhat diffusely diseased with an area of about 70% stenosis in the mid SFA, and about 60 to 65% stenosis in the proximal SFA and at Hunter's canal.  It was somewhat difficult to opacify due to her knee prosthesis, but the popliteal artery seemed to normalize after the stenosis at Hunter's canal.  The tibioperoneal trunk had a high-grade stenosis in the 90 to 95% range just above the split to the posterior tibial and peroneal arteries which were then patent distally with the posterior tibial artery being the dominant runoff to the foot.  The anterior tibial  artery occluded in its proximal segment and did not appear to reconstitute distally.    Disposition: Patient was taken to the recovery room in stable condition having tolerated the procedure well.  Complications: None  Festus Barren 10/23/2022 2:03 PM   This note was created with Dragon Medical transcription system. Any errors in dictation are purely unintentional.

## 2022-10-23 NOTE — OR Nursing (Signed)
Pre med not gien since contrast allergy is only due to renal insufficiency

## 2022-10-23 NOTE — OR Nursing (Signed)
Pt rested 30 more minutes after drinking two glasses of water. (Had approximently 500 ml NS, two cans ginger ale and 100 ml antibiotic. Prior to issue).Lying bp 110/58 sitting 98/58. Unable to get a standing bp despite two attempts. But pt much more stable Balanace better and stronger. Pt Husband and Brien discussed care after discharge. Husband and patient feel comfortable with discharge. Assisted to personal wheelchair. Assisted to vehicle. Pt discharged home in husbands care.

## 2022-10-24 ENCOUNTER — Encounter: Payer: Self-pay | Admitting: Vascular Surgery

## 2022-10-24 ENCOUNTER — Other Ambulatory Visit: Payer: Self-pay

## 2022-10-25 ENCOUNTER — Other Ambulatory Visit: Payer: Self-pay

## 2022-10-25 ENCOUNTER — Encounter: Payer: Self-pay | Admitting: Vascular Surgery

## 2022-10-25 ENCOUNTER — Telehealth: Payer: Self-pay | Admitting: Family Medicine

## 2022-10-25 NOTE — Telephone Encounter (Signed)
Pt states that she is having issues getting her medication: tirzepatide (MOUNJARO) 12.5 MG/0.5ML Pen. Pt states per her pharmacy it will not come in until the end of August and they are not for sure if it will come in then. Pt uses: [facility]  Phone: [phone] Fax: [phone] Please advise.

## 2022-10-26 ENCOUNTER — Other Ambulatory Visit: Payer: Self-pay

## 2022-10-28 ENCOUNTER — Other Ambulatory Visit (HOSPITAL_COMMUNITY): Payer: Self-pay

## 2022-10-30 NOTE — Telephone Encounter (Signed)
Called and spoke with patient states her script was ready for pickup today

## 2022-11-21 ENCOUNTER — Other Ambulatory Visit: Payer: Self-pay | Admitting: Family Medicine

## 2022-11-22 ENCOUNTER — Other Ambulatory Visit (INDEPENDENT_AMBULATORY_CARE_PROVIDER_SITE_OTHER): Payer: Self-pay | Admitting: Vascular Surgery

## 2022-11-22 DIAGNOSIS — Z9889 Other specified postprocedural states: Secondary | ICD-10-CM

## 2022-11-22 NOTE — Telephone Encounter (Signed)
Requested Prescriptions  Pending Prescriptions Disp Refills   SYNTHROID 125 MCG tablet [Pharmacy Med Name: SYNTHROID 125 MCG TAB] 30 tablet 1    Sig: TAKE 1 TABLET EVERY DAY ON EMPTY STOMACHWITH A GLASS OF WATER AT LEAST 30-60 MINBEFORE BREAKFAST     Endocrinology:  Hypothyroid Agents Failed - 11/21/2022  2:51 PM      Failed - TSH in normal range and within 360 days    TSH  Date Value Ref Range Status  09/04/2022 0.014 (L) 0.450 - 4.500 uIU/mL Final         Passed - Valid encounter within last 12 months    Recent Outpatient Visits           2 months ago Essential hypertension   [facility] [place], Megan P, DO   3 months ago Chronic midline back pain, unspecified back location   [place] [facility], Megan P, DO   5 months ago Type 2 diabetes mellitus with diabetic peripheral angiopathy without gangrene, without long-term current use of insulin (HCC)   [place] [facility] [place], Megan P, DO   6 months ago Bartholin gland cyst   [place] [facility] [place], Megan P, DO   7 months ago Uncontrolled type 2 diabetes mellitus with hyperglycemia, with long-term current use of insulin [facility])   [place] [facility] [place], Oralia Rud, DO       Future Appointments             In 1 week Dorcas Carrow, DO [place] [facility]   In 2 weeks Glenford Bayley, NP [facility]

## 2022-11-27 ENCOUNTER — Telehealth: Payer: Medicare Other

## 2022-11-27 ENCOUNTER — Other Ambulatory Visit: Payer: Medicare Other

## 2022-11-27 ENCOUNTER — Other Ambulatory Visit: Payer: Self-pay

## 2022-11-27 NOTE — Patient Instructions (Signed)
Visit Information  Thank you for taking time to visit with me today. Please don't hesitate to contact me if I can be of assistance to you before our next scheduled telephone appointment.  Following are the goals we discussed today:   Goals Addressed             This Visit's Progress    COMPLETED: CCM Expected Outcome:  Monitor, Self-Manage and Reduce Symptoms of: Falls prevention and safety       Current Barriers: Closing this goal. The patient is doing well and no new falls. Will continue to monitor and follow up accordingly.  Care Coordination needs related to falls prevention and safety in a patient with  high fall risk in a patient with recent falls with injury 2 weeks ago Chronic Disease Management support and education needs related to falls and safety prevention  Planned Interventions: Provided written and verbal education re: potential causes of falls and Fall prevention strategies. The patient feels the best she has in a really long time. She denies any acute changes and is not having to use her cane or walker as much.  Reviewed medications and discussed potential side effects of medications such as dizziness and frequent urination. Medications reviewed. No new issues with medications Advised patient of importance of notifying provider of falls. Education on calling the office for new falls or safety concerns Assessed for signs and symptoms of orthostatic hypotension Assessed for falls since last encounter. Last fall was in October. The patient denies any new falls. Review of fall and safety prevention. The patient has had broken ribs from the fall and when she had pneumonia in December/January she feels this has aggravated the area. She is doing well. No new falls.  Assessed patients knowledge of fall risk prevention secondary to previously provided education. The patient is doing better.She feels the best she has felt in a really long time. She denies any acute changes.  Advised  patient to discuss new falls when they occur, changes in conditions that create increased risk for falls and safety concerns with provider Screening for signs and symptoms of depression related to chronic disease state Assessed social determinant of health barriers  Symptom Management: Take medications as prescribed   Attend all scheduled provider appointments Call provider office for new concerns or questions  Work with the social worker to address care coordination needs and will continue to work with the clinical team to address health care and disease management related needs call the Suicide and Crisis Lifeline: 988 call the [place] National Suicide Prevention Lifeline: 670-743-9927 or TTY: (403)506-2542 TTY 208 454 5893) to talk to a trained counselor call 1-800-273-TALK (toll free, 24 hour hotline) if experiencing a Mental Health or Behavioral Health Crisis   Follow Up Plan: Telephone follow up appointment with care management team member scheduled for: 11-27-2022 at 1030 am       COMPLETED: [facility] Care Management  Expected Outcome:  Monitor, Self-Manage and Reduce Symptoms of  breast cancer       Current Barriers: The patient is stable and no new changes in her condition. Closing this goal and will reopen if there are new findings Chronic Disease Management support and education needs related to effective management of breast cancer  Planned Interventions: Assessed patient understanding of cancer diagnosis and recommended treatment plan. Denies any new concerns with her breast cancer health and well being. Sees specialist on a regular basis. Education provided Reviewed upcoming provider appointments and treatment appointments. Saw the pcp on 11-27-2022  at 1030 am Assessed available transportation to appointments and treatments. Has consistent/reliable transportation: Yes Assessed support system. Has consistent/reliable family or other support: Yes Nutrition assessment performed The  patient saw the oncologist yesterday and current treatment plan with Arimidex is effective. The patient states there were  no changes in the plan of care for her breast cancer treatment plan. Follows up accordingly with the oncologist.  Denies any new concerns related to her history of breast cancer   Symptom Management: Take medications as prescribed   Attend all scheduled provider appointments Call pharmacy for medication refills 3-7 days in advance of running out of medications Call provider office for new concerns or questions  call the Suicide and Crisis Lifeline: 988 call the [place] National Suicide Prevention Lifeline: 782-135-2677 or TTY: (778)376-2238 TTY [phone]) to talk to a trained counselor call 1-800-273-TALK (toll free, 24 hour hotline) if experiencing a Mental Health or Behavioral Health Crisis   Follow Up Plan: Telephone follow up appointment with care management team member scheduled for: 11-27-2022 at 1030 am       RNCM Care Management (DIABETES) EXPECTED OUTCOME: MONITOR, SELF-MANAGE AND REDUCE SYMPTOMS OF DIABETES       Current Barriers:  Knowledge Deficits related to how to effectively manage and maintain DM and goal of A1C <8% Chronic Disease Management support and education needs related to effective management DM Goal set by the provider <8.1% Lab Results  Component Value Date   HGBA1C 6.5 (H) 09/04/2022     Planned Interventions: Provided education to patient about basic DM disease process. Education provided. The patient has a stable A1C. Is at goal and is happy that she is at goal. She feels great and it has been a long time since she felt this good. Education and support provided. ; Reviewed medications with patient and discussed importance of medication adherence. The patient is compliant with medications. The patient states she was able to get the [facility] from the outpatient pharmacy and is thankful for the resource. Has her medications now. Denies any  acute changes at this time. Denies any new concerns related to medications. Continues to get her medications from the outpatient pharmacy at [facility]. Is happy they have her medications in stock.  Reviewed prescribed diet with patient heart healthy/ADA diet. The patient is doing well and says that she has lost weight. She is eating but not snacking like she was. She will drink boost every now and then to help with protein intake.  She in addition to the [facility] is taking "Golo". She states that she feels the best she has felt in a really long time. Education and support given.  Counseled on importance of regular laboratory monitoring as prescribed. Labs are up to date. Denies any acute findings. A1C is stable and trending down.;        Discussed plans with patient for ongoing care management follow up and provided patient with direct contact information for care management team;      Provided patient with written educational materials related to hypo and hyperglycemia and importance of correct treatment. Review of how to effectively manage drops in blood sugars or highs. The patient states the [facility] is really helpful and alerts her when her blood sugars is high or low. Continues to use the freestyle [place]. She did have some elevations of her blood sugars when she was having nerve block and cortisone shots but it is stable now. The patient states that she is doing well and denies any acute  highs or lows. Will continue to monitor.  Reviewed scheduled/upcoming provider appointments including: Next pcp appointment on 01-22-2023 at 1030 am Advised patient, providing education and rationale, to check cbg when you have symptoms of low or high blood sugar and has freestyle libre  and record. States her blood sugars are really good. Her blood sugars  have been more stable since her procedure and denies any acute findings.  call provider for findings outside established parameters;       Review of patient  status, including review of consultants reports, relevant laboratory and other test results, and medications completed;       Advised patient to discuss changes in DM and diabetes health with provider;      Screening for signs and symptoms of depression related to chronic disease state;        Assessed social determinant of health barriers;     The patient states she feels the best she has felt in a really long time. She says she is able to do things she has not been able to do in a long time. She is having some pain in her leg from her procedure in August and sees the provider this week for evaluation. She states that other than that things are going well.    Symptom Management: Take medications as prescribed   Attend all scheduled provider appointments Call pharmacy for medication refills 3-7 days in advance of running out of medications Call provider office for new concerns or questions  call the Suicide and Crisis Lifeline: 988 call the [place] National Suicide Prevention Lifeline: 901-744-4034 or TTY: (951)476-5478 TTY 787-088-5721) to talk to a trained counselor call 1-800-273-TALK (toll free, 24 hour hotline) if experiencing a Mental Health or Behavioral Health Crisis  check feet daily for cuts, sores or redness trim toenails straight across manage portion size wash and dry feet carefully every day wear comfortable, cotton socks wear comfortable, well-fitting shoes  Follow Up Plan: Telephone follow up appointment with care management team member scheduled for: 01-22-2023 at 1030 am       RNCM Care Management Expected Outcome:  Monitor, Self-Manage and Reduce Symptoms of CAD       Current Barriers:  Chronic Disease Management support and education needs related to effective management of CAD BP Readings from Last 3 Encounters:  10/23/22 110/87  10/05/22 121/64  09/19/22 133/73     Planned Interventions: Assessed understanding of CAD diagnosis. The patient states that she is  much better and having a good day today. She states she feels the best she has felt in a really long time. Her CAD is stable and Her blood pressures are good. She had her procedure in August on her leg and she still has been having a lot of pain. She will go back to the specialist this week. She says this one was different than the one she had 11 years ago. She denies any acute distress but is hopeful they can tell her something that will help with the pain she is having. Reflective listening and support given.  Medications reviewed including medications utilized in CAD treatment plan. Is compliant with medications. Denies any new concerns with medications.  Provided education on importance of blood pressure control in management of CAD. Her blood pressures are stable. She denies any acute findings with changes in her blood pressures. ; Provided education on Importance of limiting foods high in cholesterol. Has lost weight, eats healthy, does not eat between meals.; Counseled on importance of  regular laboratory monitoring as prescribed. The patient has regular lab work.; Reviewed Importance of taking all medications as prescribed. Takes medications as prescribed.  Reviewed Importance of attending all scheduled provider appointments. Sees the pcp on a regular basis. Next pcp appointment on 12-05-2022 at 2 pm Advised to report any changes in symptoms or exercise tolerance Advised patient to discuss changes in CAD or heart health with provider; Screening for signs and symptoms of depression related to chronic disease state;  Assessed social determinant of health barriers;   Symptom Management: Take medications as prescribed   Attend all scheduled provider appointments Call provider office for new concerns or questions  call the Suicide and Crisis Lifeline: 988 call the [place] National Suicide Prevention Lifeline: (810)071-6585 or TTY: (561) 560-3960 TTY (607)787-0595) to talk to a trained counselor call  1-800-273-TALK (toll free, 24 hour hotline) if experiencing a Mental Health or Behavioral Health Crisis   Follow Up Plan: Telephone follow up appointment with care management team member scheduled for: 01-22-2023 at 1030 am           Our next appointment is by telephone on 01-22-2023 at 1030 am  Please call the care guide team at [phone] if you need to cancel or reschedule your appointment.   If you are experiencing a Mental Health or Behavioral Health Crisis or need someone to talk to, please call the Suicide and Crisis Lifeline: 988 call the [place] National Suicide Prevention Lifeline: (337) 342-1460 or TTY: 317 178 4047 TTY 786-103-4831) to talk to a trained counselor call 1-800-273-TALK (toll free, 24 hour hotline)   Patient verbalizes understanding of instructions and care plan provided today and agrees to view in MyChart. Active MyChart status and patient understanding of how to access instructions and care plan via MyChart confirmed with patient.     Telephone follow up appointment with care management team member scheduled for: 01-22-2023 at 1030 am  Alto Denver RN, MSN, CCM RN Care Manager  [facility]  Ambulatory Care Management  Direct Number: [phone]

## 2022-11-27 NOTE — Patient Outreach (Signed)
Care Management   Visit Note  11/27/2022 Name: Tammy [name] MRN: [id number] DOB: 28-Feb-1946  Subjective: Tammy [name] is a 77 y.o. year old female who is a primary care patient of Dorcas Carrow, DO. The Care Management team was consulted for assistance.      Engaged with patient spoke with patient by telephone.    Goals Addressed             This Visit's Progress    COMPLETED: CCM Expected Outcome:  Monitor, Self-Manage and Reduce Symptoms of: Falls prevention and safety       Current Barriers: Closing this goal. The patient is doing well and no new falls. Will continue to monitor and follow up accordingly.  Care Coordination needs related to falls prevention and safety in a patient with  high fall risk in a patient with recent falls with injury 2 weeks ago Chronic Disease Management support and education needs related to falls and safety prevention  Planned Interventions: Provided written and verbal education re: potential causes of falls and Fall prevention strategies. The patient feels the best she has in a really long time. She denies any acute changes and is not having to use her cane or walker as much.  Reviewed medications and discussed potential side effects of medications such as dizziness and frequent urination. Medications reviewed. No new issues with medications Advised patient of importance of notifying provider of falls. Education on calling the office for new falls or safety concerns Assessed for signs and symptoms of orthostatic hypotension Assessed for falls since last encounter. Last fall was in October. The patient denies any new falls. Review of fall and safety prevention. The patient has had broken ribs from the fall and when she had pneumonia in December/January she feels this has aggravated the area. She is doing well. No new falls.  Assessed patients knowledge of fall risk prevention secondary to previously provided education. The patient is doing better.She feels  the best she has felt in a really long time. She denies any acute changes.  Advised patient to discuss new falls when they occur, changes in conditions that create increased risk for falls and safety concerns with provider Screening for signs and symptoms of depression related to chronic disease state Assessed social determinant of health barriers  Symptom Management: Take medications as prescribed   Attend all scheduled provider appointments Call provider office for new concerns or questions  Work with the social worker to address care coordination needs and will continue to work with the clinical team to address health care and disease management related needs call the Suicide and Crisis Lifeline: 988 call the [place] National Suicide Prevention Lifeline: 785-045-5033 or TTY: 907-733-8545 TTY 905 694 0916) to talk to a trained counselor call 1-800-273-TALK (toll free, 24 hour hotline) if experiencing a Mental Health or Behavioral Health Crisis   Follow Up Plan: Telephone follow up appointment with care management team member scheduled for: 11-27-2022 at 1030 am       COMPLETED: [facility] Care Management  Expected Outcome:  Monitor, Self-Manage and Reduce Symptoms of  breast cancer       Current Barriers: The patient is stable and no new changes in her condition. Closing this goal and will reopen if there are new findings Chronic Disease Management support and education needs related to effective management of breast cancer  Planned Interventions: Assessed patient understanding of cancer diagnosis and recommended treatment plan. Denies any new concerns with her breast cancer health and well being.  Sees specialist on a regular basis. Education provided Reviewed upcoming provider appointments and treatment appointments. Saw the pcp on 11-27-2022 at 1030 am Assessed available transportation to appointments and treatments. Has consistent/reliable transportation: Yes Assessed support system. Has  consistent/reliable family or other support: Yes Nutrition assessment performed The patient saw the oncologist yesterday and current treatment plan with Arimidex is effective. The patient states there were  no changes in the plan of care for her breast cancer treatment plan. Follows up accordingly with the oncologist.  Denies any new concerns related to her history of breast cancer   Symptom Management: Take medications as prescribed   Attend all scheduled provider appointments Call pharmacy for medication refills 3-7 days in advance of running out of medications Call provider office for new concerns or questions  call the Suicide and Crisis Lifeline: 988 call the [place] National Suicide Prevention Lifeline: 315-206-2464 or TTY: 859-365-4091 TTY (567)056-1773) to talk to a trained counselor call 1-800-273-TALK (toll free, 24 hour hotline) if experiencing a Mental Health or Behavioral Health Crisis   Follow Up Plan: Telephone follow up appointment with care management team member scheduled for: 11-27-2022 at 1030 am       RNCM Care Management (DIABETES) EXPECTED OUTCOME: MONITOR, SELF-MANAGE AND REDUCE SYMPTOMS OF DIABETES       Current Barriers:  Knowledge Deficits related to how to effectively manage and maintain DM and goal of A1C <8% Chronic Disease Management support and education needs related to effective management DM Goal set by the provider <8.1% Lab Results  Component Value Date   HGBA1C 6.5 (H) 09/04/2022     Planned Interventions: Provided education to patient about basic DM disease process. Education provided. The patient has a stable A1C. Is at goal and is happy that she is at goal. She feels great and it has been a long time since she felt this good. Education and support provided. ; Reviewed medications with patient and discussed importance of medication adherence. The patient is compliant with medications. The patient states she was able to get the [facility] from the  outpatient pharmacy and is thankful for the resource. Has her medications now. Denies any acute changes at this time. Denies any new concerns related to medications. Continues to get her medications from the outpatient pharmacy at [facility]. Is happy they have her medications in stock.  Reviewed prescribed diet with patient heart healthy/ADA diet. The patient is doing well and says that she has lost weight. She is eating but not snacking like she was. She will drink boost every now and then to help with protein intake.  She in addition to the [facility] is taking "Golo". She states that she feels the best she has felt in a really long time. Education and support given.  Counseled on importance of regular laboratory monitoring as prescribed. Labs are up to date. Denies any acute findings. A1C is stable and trending down.;        Discussed plans with patient for ongoing care management follow up and provided patient with direct contact information for care management team;      Provided patient with written educational materials related to hypo and hyperglycemia and importance of correct treatment. Review of how to effectively manage drops in blood sugars or highs. The patient states the [facility] is really helpful and alerts her when her blood sugars is high or low. Continues to use the freestyle [place]. She did have some elevations of her blood sugars when she was having nerve block  and cortisone shots but it is stable now. The patient states that she is doing well and denies any acute highs or lows. Will continue to monitor.  Reviewed scheduled/upcoming provider appointments including: Next pcp appointment on 01-22-2023 at 1030 am Advised patient, providing education and rationale, to check cbg when you have symptoms of low or high blood sugar and has freestyle libre  and record. States her blood sugars are really good. Her blood sugars  have been more stable since her procedure and denies any acute findings.   call provider for findings outside established parameters;       Review of patient status, including review of consultants reports, relevant laboratory and other test results, and medications completed;       Advised patient to discuss changes in DM and diabetes health with provider;      Screening for signs and symptoms of depression related to chronic disease state;        Assessed social determinant of health barriers;     The patient states she feels the best she has felt in a really long time. She says she is able to do things she has not been able to do in a long time. She is having some pain in her leg from her procedure in August and sees the provider this week for evaluation. She states that other than that things are going well.    Symptom Management: Take medications as prescribed   Attend all scheduled provider appointments Call pharmacy for medication refills 3-7 days in advance of running out of medications Call provider office for new concerns or questions  call the Suicide and Crisis Lifeline: 988 call the [place] National Suicide Prevention Lifeline: (530)619-6281 or TTY: (912)086-5367 TTY 670-032-4026) to talk to a trained counselor call 1-800-273-TALK (toll free, 24 hour hotline) if experiencing a Mental Health or Behavioral Health Crisis  check feet daily for cuts, sores or redness trim toenails straight across manage portion size wash and dry feet carefully every day wear comfortable, cotton socks wear comfortable, well-fitting shoes  Follow Up Plan: Telephone follow up appointment with care management team member scheduled for: 01-22-2023 at 1030 am       RNCM Care Management Expected Outcome:  Monitor, Self-Manage and Reduce Symptoms of CAD       Current Barriers:  Chronic Disease Management support and education needs related to effective management of CAD BP Readings from Last 3 Encounters:  10/23/22 110/87  10/05/22 121/64  09/19/22 133/73     Planned  Interventions: Assessed understanding of CAD diagnosis. The patient states that she is much better and having a good day today. She states she feels the best she has felt in a really long time. Her CAD is stable and Her blood pressures are good. She had her procedure in August on her leg and she still has been having a lot of pain. She will go back to the specialist this week. She says this one was different than the one she had 11 years ago. She denies any acute distress but is hopeful they can tell her something that will help with the pain she is having. Reflective listening and support given.  Medications reviewed including medications utilized in CAD treatment plan. Is compliant with medications. Denies any new concerns with medications.  Provided education on importance of blood pressure control in management of CAD. Her blood pressures are stable. She denies any acute findings with changes in her blood pressures. ; Provided education on Importance  of limiting foods high in cholesterol. Has lost weight, eats healthy, does not eat between meals.; Counseled on importance of regular laboratory monitoring as prescribed. The patient has regular lab work.; Reviewed Importance of taking all medications as prescribed. Takes medications as prescribed.  Reviewed Importance of attending all scheduled provider appointments. Sees the pcp on a regular basis. Next pcp appointment on 12-05-2022 at 2 pm Advised to report any changes in symptoms or exercise tolerance Advised patient to discuss changes in CAD or heart health with provider; Screening for signs and symptoms of depression related to chronic disease state;  Assessed social determinant of health barriers;   Symptom Management: Take medications as prescribed   Attend all scheduled provider appointments Call provider office for new concerns or questions  call the Suicide and Crisis Lifeline: 988 call the [place] National Suicide Prevention Lifeline:  (431) 720-4658 or TTY: 5861139449 TTY 503 049 4103) to talk to a trained counselor call 1-800-273-TALK (toll free, 24 hour hotline) if experiencing a Mental Health or Behavioral Health Crisis   Follow Up Plan: Telephone follow up appointment with care management team member scheduled for: 01-22-2023 at 1030 am            Consent to Services:  Patient was given information about care management services, agreed to services, and gave verbal consent to participate.   Plan: Telephone follow up appointment with care management team member scheduled for: 01-22-2023 at 1030 am  Alto Denver RN, MSN, CCM RN Care Manager  [facility]  Ambulatory Care Management  Direct Number: [phone]

## 2022-11-29 ENCOUNTER — Ambulatory Visit (INDEPENDENT_AMBULATORY_CARE_PROVIDER_SITE_OTHER): Payer: Medicare Other

## 2022-11-29 ENCOUNTER — Ambulatory Visit (INDEPENDENT_AMBULATORY_CARE_PROVIDER_SITE_OTHER): Payer: Medicare Other | Admitting: Nurse Practitioner

## 2022-11-29 ENCOUNTER — Encounter (INDEPENDENT_AMBULATORY_CARE_PROVIDER_SITE_OTHER): Payer: Self-pay | Admitting: Nurse Practitioner

## 2022-11-29 VITALS — BP 84/57 | HR 77 | Resp 16 | Wt 180.0 lb

## 2022-11-29 DIAGNOSIS — Z794 Long term (current) use of insulin: Secondary | ICD-10-CM

## 2022-11-29 DIAGNOSIS — Z9889 Other specified postprocedural states: Secondary | ICD-10-CM | POA: Diagnosis not present

## 2022-11-29 DIAGNOSIS — E1122 Type 2 diabetes mellitus with diabetic chronic kidney disease: Secondary | ICD-10-CM | POA: Diagnosis not present

## 2022-11-29 DIAGNOSIS — I70211 Atherosclerosis of native arteries of extremities with intermittent claudication, right leg: Secondary | ICD-10-CM

## 2022-11-29 DIAGNOSIS — I10 Essential (primary) hypertension: Secondary | ICD-10-CM | POA: Diagnosis not present

## 2022-11-29 DIAGNOSIS — N183 Chronic kidney disease, stage 3 unspecified: Secondary | ICD-10-CM | POA: Diagnosis not present

## 2022-11-29 DIAGNOSIS — I739 Peripheral vascular disease, unspecified: Secondary | ICD-10-CM

## 2022-11-29 NOTE — Progress Notes (Signed)
Subjective:    Patient ID: Tammy [name], female    DOB: Sep 23, 1945, 77 y.o.   MRN: [id number] No chief complaint on file.   The patient returns to the office for followup and review status post angiogram with intervention on 10/23/2022.   Procedure: Procedure(s) Performed:             1.  Ultrasound guidance for vascular access left femoral artery             2.  Catheter placement into right common femoral artery from left femoral approach             3.  Aortogram and selective right lower extremity angiogram             4.  Percutaneous transluminal angioplasty of right tibioperoneal trunk and most proximal posterior tibial artery with 4 mm diameter Lutonix drug-coated angioplasty balloon             5.  Percutaneous transluminal angioplasty of right SFA and popliteal arteries with 5 mm diameter by 30 cm length Lutonix drug-coated angioplasty balloon             6.  Stent placement to the right SFA and most proximal popliteal artery with 6 mm diameter by 25 cm length Viabahn stent             7.  StarClose closure device left femoral artery   The patient notes improvement in the lower extremity symptoms. No interval shortening of the patient's claudication distance or rest pain symptoms. No new ulcers or wounds have occurred since the last visit.  She does note that she has significant back pain and notes that while the right leg feels better she can necessarily tell a difference if there is a drastic improvement in claudication between the 2 legs.  There have been no significant changes to the patient's overall health care.  No documented history of amaurosis fugax or recent TIA symptoms. There are no recent neurological changes noted. No documented history of DVT, PE or superficial thrombophlebitis. The patient denies recent episodes of angina or shortness of breath.   ABI's Rt=1.13 and Lt=0.85  (previous ABI's Rt=0.85 and Lt=0.84) Duplex US of the right lower extremity has triphasic  tibial artery waveforms with biphasic waveforms in the left lower extremity    Review of Systems  Cardiovascular:  Negative for leg swelling.  Musculoskeletal:  Positive for arthralgias, back pain and gait problem.  All other systems reviewed and are negative.      Objective:   Physical Exam Vitals reviewed.  HENT:     Head: Normocephalic.  Cardiovascular:     Rate and Rhythm: Normal rate.     Pulses:          Dorsalis pedis pulses are detected w/ Doppler on the right side and detected w/ Doppler on the left side.       Posterior tibial pulses are detected w/ Doppler on the right side and detected w/ Doppler on the left side.  Pulmonary:     Effort: Pulmonary effort is normal.  Skin:    General: Skin is warm and dry.  Neurological:     Mental Status: She is alert and oriented to person, place, and time.  Psychiatric:        Mood and Affect: Mood normal.        Behavior: Behavior normal.        Thought Content: Thought content normal.  Judgment: Judgment normal.     There were no vitals taken for this visit.  Past Medical History:  Diagnosis Date   Asthma    Benign neoplasm of colon    Breast cancer (HCC) 08/2018   left breast   Cervical disc disease    "bulging" - no limitations per pt   Chronic diastolic CHF (congestive heart failure) (HCC)    a. 07/2012 Echo: Nl EF. mild inferoseptal HK, Gr 2 DD, mild conc LVH, mild PR/TR, mild PAH; b. 08/2019 Echo: EF 55-60%, no rwma, Gr1 DD. Nl RV size/fxn.   CKD (chronic kidney disease), stage III (HCC)    COPD (chronic obstructive pulmonary disease) (HCC)    Coronary artery disease    a. 11/2011 Cath: LAD 30m/d, LCX min irregs, RCA 70p, 14m with L->R collats, EF 60%-->Med Rx; b. 03/2019 Cath: LM nl, LAD 40-29m. LCX large, mild diff dzs. OM1/2 nl. RCA known to be 114m. L-->R collats from LAD & LCX/OM.   Diabetes mellitus without complication (HCC)    Diagnosis unknown 05/07/2022   Fusion of lumbar spine 12/22/2015   GERD  (gastroesophageal reflux disease)    History of DVT (deep vein thrombosis)    History of tobacco abuse    a. Quit 2011.   Hyperlipidemia    Hypertension    Hypothyroidism    Lung cancer (HCC)    Malignant neoplasm of unspecified site of right female breast (HCC) 03/14/1999   Obesity    Palpitations    Personal history of radiation therapy    PONV (postoperative nausea and vomiting)    Pulmonary embolus (HCC) 12/25/2013   RLL. Presented with SOB and elevated d-dimer.    PVD (peripheral vascular disease) (HCC)    a. PTA of right leg and left femoral artery with stenosis; b. 09/2020 ABI: R 0.93, L 0.88 - unchanged from prior study.   Stroke [facility]) 2015   TIAs - no deficits    Social History   Socioeconomic History   Marital status: Married    Spouse name: Not on file   Number of children: 2   Years of education: some college, Financial trader for interior design    Highest education level: Associate degree: occupational, Scientist, product/process development, or vocational program  Occupational History   Occupation: Retired  Tobacco Use   Smoking status: Former    Current packs/day: 0.00    Average packs/day: 0.5 packs/day for 15.0 years (7.5 ttl pk-yrs)    Types: Cigarettes    Start date: 03/13/1992    Quit date: 03/14/2007    Years since quitting: 15.7   Smokeless tobacco: Never  Vaping Use   Vaping status: Never Used  Substance and Sexual Activity   Alcohol use: No    Alcohol/week: 0.0 standard drinks of alcohol   Drug use: Never   Sexual activity: Not Currently    Birth control/protection: Post-menopausal  Other Topics Concern   Not on file  Social History Narrative   Lives at home with [place] in [place]. Quit smoking 10 years ago; never alcohol. 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    Social Determinants of Health   Financial Resource Strain: Low Risk  (05/08/2022)   Overall Financial Resource Strain (CARDIA)    Difficulty of Paying Living  Expenses: Not hard at all  Food Insecurity: No Food Insecurity (03/14/2022)   Hunger Vital Sign    Worried About Running Out of Food in the Last Year: Never  true    Ran Out of Food in the Last Year: Never true  Transportation Needs: No Transportation Needs (05/08/2022)   PRAPARE - Administrator, Civil Service (Medical): No    Lack of Transportation (Non-Medical): No  Physical Activity: Insufficiently Active (10/02/2022)   Exercise Vital Sign    Days of Exercise per Week: 5 days    Minutes of Exercise per Session: 20 min  Stress: No Stress Concern Present (12/26/2021)   [employer] of Occupational Health - Occupational Stress Questionnaire    Feeling of Stress : Not at all  Social Connections: Socially Integrated (12/28/2020)   Social Connection and Isolation Panel [NHANES]    Frequency of Communication with Friends and Family: More than three times a week    Frequency of Social Gatherings with Friends and Family: More than three times a week    Attends Religious Services: More than 4 times per year    Active Member of [employer] or Organizations: Yes    Attends Banker Meetings: 1 to 4 times per year    Marital Status: Married  Catering manager Violence: Not At Risk (12/26/2021)   Humiliation, Afraid, Rape, and Kick questionnaire    Fear of Current or Ex-Partner: No    Emotionally Abused: No    Physically Abused: No    Sexually Abused: No    Past Surgical History:  Procedure Laterality Date   ANGIOPLASTY / STENTING FEMORAL     PVD; angioplasty right leg and left femoral artery with stenosis.    APPENDECTOMY  12/2014   BACK SURGERY  03/2012   nerve stimulator inserted   BACK SURGERY     screws, rods replaced with new hardware.   BACK SURGERY  11/2016   BREAST LUMPECTOMY Left 08/26/2018   BREAST SURGERY     CARDIAC CATHETERIZATION  12/07/2011   Mid LAD 50%, distal LAD 50%, mid RCA 70%, distal RCA 100%    CARDIAC CATHETERIZATION  01/04/2011   100%  occluded mid RCA with good collaterals from distal LAD, normal LVEF.   CATARACT EXTRACTION Right 12/21/2021   CHOLECYSTECTOMY     COLONOSCOPY  2013   COLONOSCOPY WITH PROPOFOL N/A 05/24/2015   Procedure: COLONOSCOPY WITH PROPOFOL;  Surgeon: Midge Minium, MD;  Location: [facility];  Service: Endoscopy;  Laterality: N/A;  Diabetic - oral meds   COLONOSCOPY WITH PROPOFOL N/A 01/28/2019   Procedure: COLONOSCOPY WITH PROPOFOL;  Surgeon: Pasty Spillers, MD;  Location: [facility];  Service: Endoscopy;  Laterality: N/A;   DIAGNOSTIC LAPAROSCOPY     ESOPHAGOGASTRODUODENOSCOPY (EGD) WITH PROPOFOL N/A 01/28/2019   Procedure: ESOPHAGOGASTRODUODENOSCOPY (EGD) WITH PROPOFOL;  Surgeon: Pasty Spillers, MD;  Location: [facility];  Service: Endoscopy;  Laterality: N/A;   LAPAROSCOPIC APPENDECTOMY N/A 01/06/2015   Procedure: APPENDECTOMY LAPAROSCOPIC;  Surgeon: Kieth Brightly, MD;  Location: [facility];  Service: General;  Laterality: N/A;   LEFT HEART CATH AND CORONARY ANGIOGRAPHY Left 04/04/2019   Procedure: LEFT HEART CATH AND CORONARY ANGIOGRAPHY;  Surgeon: Antonieta Iba, MD;  Location: [facility];  Service: Cardiovascular;  Laterality: Left;   LOWER EXTREMITY INTERVENTION Right 10/23/2022   Procedure: Lower Extremity Angiography;  Surgeon: Annice Needy, MD;  Location: [facility];  Service: Cardiovascular;  Laterality: Right;   MASTECTOMY W/ SENTINEL NODE BIOPSY Left 08/26/2018   Procedure: PARTIAL MASTECTOMY WIDE EXCISION WITH SENTINEL LYMPH NODE BIOPSY LEFT;  Surgeon: Earline Mayotte, MD;  Location: [facility];  Service: General;  Laterality: Left;   POLYPECTOMY  05/24/2015   Procedure: POLYPECTOMY;  Surgeon: Midge Minium, MD;  Location: [facility];  Service: Endoscopy;;   REPLACEMENT TOTAL KNEE Right    RIGHT/LEFT HEART CATH AND CORONARY ANGIOGRAPHY Bilateral 09/26/2021   Procedure: RIGHT/LEFT HEART CATH AND CORONARY ANGIOGRAPHY;  Surgeon:  Iran Ouch, MD;  Location: [facility];  Service: Cardiovascular;  Laterality: Bilateral;   THYROIDECTOMY     TOTAL KNEE ARTHROPLASTY Right 11/13/2019   TRIGGER FINGER RELEASE      Family History  Problem Relation Age of Onset   Cancer Mother        colon   Hypertension Father    Heart disease Father    Heart attack Father    Diabetes Father    Cancer Sister        lung   COPD Sister    COPD Sister    Heart attack Brother    Diabetes Brother    Hypertension Brother    Heart disease Brother    Heart attack Brother    Diabetes Brother    Hypertension Brother    Heart disease Brother    Heart attack Brother    Diabetes Brother    Hypertension Brother    Heart disease Brother    Colon cancer Brother 40   Lung cancer Brother    Breast cancer Neg Hx     Allergies  Allergen Reactions   Hydrocodone Other (See Comments)    Unresponsive   Aleve [Naproxen Sodium]     Hives & itching    Contrast Media [Iodinated Contrast Media]     Pt avoids due to kidney issues   Ioxaglate Other (See Comments)    (iodine) Pt avoids due to kidney issues    Oxycodone Other (See Comments)    hallucinations   Ranexa [Ranolazine]     Sick on stomach   Sulfa Antibiotics Other (See Comments)    Pt avoids due to kidney issues   Tramadol Other (See Comments)    nightmares Other reaction(s): Hallucination, Other (See Comments) nightmares        Latest Ref Rng & Units 09/04/2022    2:49 PM 08/01/2022    3:32 PM 03/16/2022    1:27 PM  CBC  WBC 3.4 - 10.8 x10E3/uL 9.7  9.3  9.3   Hemoglobin 11.1 - 15.9 g/dL 60.4  54.0  98.1   Hematocrit 34.0 - 46.6 % 43.0  46.1  42.9   Platelets 150 - 450 x10E3/uL 296  282  412       CMP     Component Value Date/Time   NA 139 09/04/2022 1449   NA 140 09/20/2011 1134   K 5.1 09/04/2022 1449   K 4.4 09/20/2011 1134   CL 103 09/04/2022 1449   CL 103 09/20/2011 1134   CO2 19 (L) 09/04/2022 1449   CO2 28 09/20/2011 1134   GLUCOSE 69  (L) 09/04/2022 1449   GLUCOSE 88 08/01/2022 1532   GLUCOSE 139 (H) 09/20/2011 1134   BUN 52 (H) 10/23/2022 1159   BUN 33 (H) 09/04/2022 1449   BUN 17 09/20/2011 1134   CREATININE 1.63 (H) 10/23/2022 1159   CREATININE 1.31 (H) 09/20/2011 1134   CALCIUM 9.7 09/04/2022 1449   CALCIUM 8.4 (L) 09/20/2011 1134   PROT 6.9 09/04/2022 1449   ALBUMIN 4.3 09/04/2022 1449   AST 20 09/04/2022 1449   ALT 17 09/04/2022 1449   ALKPHOS 89 09/04/2022 1449  BILITOT 0.5 09/04/2022 1449   EGFR 35 (L) 09/04/2022 1449   GFRNONAA 32 (L) 10/23/2022 1159   GFRNONAA 42 (L) 09/20/2011 1134     No results found.     Assessment & Plan:   1. Atherosclerosis of native artery of right lower extremity with intermittent claudication (HCC) Today the patient's right lower extremity shows notable improvement post angiogram.  The patient still has evidence of atherosclerotic disease in her left lower extremity but she does not endorse significant claudication that she is able to realize.  Based on this and her studies being stable we will recommend that the patient can continue with conservative therapy including her Eliquis.  Will have her return in 3 months to reevaluate her symptoms to see if left lower extremity angiogram is needed but the patient is advised that if she begins to experience worsening claudication or symptoms prior to this to contact her office for sooner follow-up.  2. Essential hypertension Continue antihypertensive medications as already ordered, these medications have been reviewed and there are no changes at this time.  Her BP is little low today however it was within normal range when checked during ABIs.  Patient is advised to reevaluate when she returns home and if remains hypotensive to follow-up with PCP  3. Type 2 diabetes mellitus with stage 3 chronic kidney disease, with long-term current use of insulin, unspecified whether stage 3a or 3b CKD (HCC) Continue hypoglycemic medications as  already ordered, these medications have been reviewed and there are no changes at this time.  Hgb A1C to be monitored as already arranged by primary service   Current Outpatient Medications on File Prior to Visit  Medication Sig Dispense Refill   acetaminophen (TYLENOL) 500 MG tablet Take 500 mg by mouth every 6 (six) hours as needed for moderate pain. Takes up to 3 tablets/day PRN for increased pain     albuterol (PROVENTIL) (2.5 MG/3ML) 0.083% nebulizer solution Take 3 mLs (2.5 mg total) by nebulization every 6 (six) hours as needed for wheezing or shortness of breath. 225 mL 12   albuterol (VENTOLIN HFA) 108 (90 Base) MCG/ACT inhaler Inhale 2 puffs into the lungs every 6 (six) hours as needed for wheezing or shortness of breath. 18 g 3   anastrozole (ARIMIDEX) 1 MG tablet TAKE 1 TABLET BY MOUTH DAILY 90 tablet 0   apixaban (ELIQUIS) 2.5 MG TABS tablet Take 1 tablet (2.5 mg total) by mouth 2 (two) times daily. 180 tablet 1   aspirin EC 81 MG tablet Take 1 tablet (81 mg total) by mouth daily. Swallow whole. 150 tablet 2   Calcium Carb-Cholecalciferol (CALCIUM 600 + D) 600-200 MG-UNIT TABS Take 1 tablet by mouth daily.      cephALEXin (KEFLEX) 500 MG capsule Take 500 mg by mouth daily.     cholecalciferol (VITAMIN D3) 25 MCG (1000 UT) tablet Take 1,000 Units by mouth daily.     Continuous Glucose Receiver (FREESTYLE LIBRE 2 READER) DEVI As directed 1 each 12   Continuous Glucose Sensor (FREESTYLE LIBRE 2 SENSOR) MISC 1 each by Does not apply route every 14 (fourteen) days. 26 each 3   CRANBERRY PO Take 2 capsules by mouth daily.     DULoxetine (CYMBALTA) 60 MG capsule Take 2 capsules (120 mg total) by mouth daily. 180 capsule 1   fluticasone (FLONASE) 50 MCG/ACT nasal spray Place 1 spray into both nostrils daily. 16 g 2   Fluticasone-Umeclidin-Vilant (TRELEGY ELLIPTA) 100-62.5-25 MCG/ACT AEPB Inhale 1 puff into  the lungs daily. 3 each 4   gabapentin (NEURONTIN) 300 MG capsule Take 2 capsules  (600 mg total) by mouth 3 (three) times daily. (Patient taking differently: Take 600 mg by mouth 3 (three) times daily. Takes 2 tablets in the morning and 3 tablets at night) 540 capsule 1   glipiZIDE (GLUCOTROL) 5 MG tablet TAKE 1 TABLET BY MOUTH DAILY BEFORE BREAKFAST 90 tablet 1   isosorbide mononitrate (IMDUR) 60 MG 24 hr tablet Take 1 tablet (60 mg total) by mouth at bedtime. 90 tablet 1   metoprolol tartrate (LOPRESSOR) 25 MG tablet Take 1 tablet (25 mg total) by mouth 2 (two) times daily. 180 tablet 1   montelukast (SINGULAIR) 10 MG tablet Take 1 tablet (10 mg total) by mouth at bedtime. 90 tablet 0   MYRBETRIQ 50 MG TB24 tablet TAKE 1 TABLET BY MOUTH DAILY 30 tablet 2   nitroGLYCERIN (NITROSTAT) 0.4 MG SL tablet Place 1 tablet (0.4 mg total) under the tongue every 5 (five) minutes as needed for chest pain. Total of 3 doses 25 tablet 6   pantoprazole (PROTONIX) 40 MG tablet Take 1 tablet (40 mg total) by mouth daily. 90 tablet 1   Pumpkin Seed-Soy Germ (AZO BLADDER CONTROL/GO-LESS PO) Take 1 tablet by mouth in the morning and at bedtime.     rosuvastatin (CRESTOR) 40 MG tablet Take 1 tablet (40 mg total) by mouth daily. 90 tablet 1   SYNTHROID 125 MCG tablet TAKE 1 TABLET EVERY DAY ON EMPTY STOMACHWITH A GLASS OF WATER AT LEAST 30-60 MINBEFORE BREAKFAST 30 tablet 1   tirzepatide (MOUNJARO) 12.5 MG/0.5ML Pen Inject 12.5 mg into the skin once a week. 6 mL 1   trimethoprim (TRIMPEX) 100 MG tablet TAKE ONE TABLET BY MOUTH EVERY DAY 90 tablet 1   UNABLE TO FIND Med Name: Energy, memory and mood enhancer     ZINC CITRATE PO Take 1 tablet by mouth daily.     No current facility-administered medications on file prior to visit.    There are no Patient Instructions on file for this visit. No follow-ups on file.   Georgiana Spinner, NP

## 2022-12-04 ENCOUNTER — Other Ambulatory Visit: Payer: Self-pay | Admitting: Family Medicine

## 2022-12-04 ENCOUNTER — Other Ambulatory Visit: Payer: Self-pay | Admitting: Nurse Practitioner

## 2022-12-04 LAB — VAS US ABI WITH/WO TBI
Left ABI: 0.85
Right ABI: 1.12

## 2022-12-05 ENCOUNTER — Ambulatory Visit: Payer: Medicare Other | Admitting: Family Medicine

## 2022-12-05 ENCOUNTER — Encounter: Payer: Self-pay | Admitting: Family Medicine

## 2022-12-05 ENCOUNTER — Ambulatory Visit: Payer: Medicare Other | Attending: Family Medicine

## 2022-12-05 VITALS — BP 103/65 | HR 99

## 2022-12-05 DIAGNOSIS — E1151 Type 2 diabetes mellitus with diabetic peripheral angiopathy without gangrene: Secondary | ICD-10-CM

## 2022-12-05 DIAGNOSIS — Z7985 Long-term (current) use of injectable non-insulin antidiabetic drugs: Secondary | ICD-10-CM

## 2022-12-05 DIAGNOSIS — R000 Tachycardia, unspecified: Secondary | ICD-10-CM

## 2022-12-05 DIAGNOSIS — E039 Hypothyroidism, unspecified: Secondary | ICD-10-CM | POA: Diagnosis not present

## 2022-12-05 MED ORDER — GABAPENTIN 300 MG PO CAPS: 600.0000 mg | ORAL_CAPSULE | Freq: Three times a day (TID) | ORAL | 1 refills | Status: DC

## 2022-12-05 MED ORDER — ONDANSETRON HCL 4 MG PO TABS: 4.0000 mg | ORAL_TABLET | Freq: Three times a day (TID) | ORAL | 6 refills | Status: AC | PRN

## 2022-12-05 MED ORDER — METOPROLOL TARTRATE 50 MG PO TABS: 50.0000 mg | ORAL_TABLET | Freq: Two times a day (BID) | ORAL | 3 refills | Status: DC

## 2022-12-05 NOTE — Assessment & Plan Note (Signed)
Rechecking labs today. Await results. Treat as needed.  °

## 2022-12-05 NOTE — Progress Notes (Signed)
BP 103/65   Pulse 99   SpO2 96%    Subjective:    Patient ID: Tammy [name], female    DOB: 27-Oct-1945, 77 y.o.   MRN: [id number]  HPI: Tammy [name] is a 77 y.o. female  Chief Complaint  Patient presents with   Diabetes   Hypertension   COPD   Tachycardia    Patient says she has noticed her heart rate is elevated. Patient says she has been having an issue with Tachycardia since Friday and the EMS was called to the home. Patient says she was checked out and everything checked out. Patient was told she may be under a lot of stress with her husband being sick lately.   PALPITATIONS Duration: 5 days Symptom description: heart racing and some pressure Duration of episode: constant Frequency: constant Activity when event occurred:  all the time Related to exertion: no Dyspnea: yes Chest pain: yes Syncope: no- unsure, may have passed out this AM Anxiety/stress: no Nausea/vomiting: no Diaphoresis: no Coronary artery disease: yes Congestive heart failure: no Arrhythmia:no Thyroid disease: yes Caffeine intake: none Status:  stable Treatments attempted:none  DIABETES Hypoglycemic episodes:no Polydipsia/polyuria: no Visual disturbance: no Chest pain: no Paresthesias: no Glucose Monitoring: yes  Accucheck frequency: Daily Taking Insulin?: no Blood Pressure Monitoring: not checking Retinal Examination: Up to Date Foot Exam: Not up to Date Diabetic Education: Completed Pneumovax: Up to Date Influenza: Not up to Date Aspirin: no  HYPOTHYROIDISM Thyroid control status:controlled Satisfied with current treatment? yes Medication side effects: no Medication compliance: excellent compliance Recent dose adjustment:yes Fatigue: yes Cold intolerance: no Heat intolerance: no Weight gain: no Weight loss: no Constipation: no Diarrhea/loose stools: no Palpitations: no Lower extremity edema: no Anxiety/depressed mood: no   Relevant past medical, surgical, family and social  history reviewed and updated as indicated. Interim medical history since our last visit reviewed. Allergies and medications reviewed and updated.  Review of Systems  Constitutional: Negative.   HENT: Negative.    Respiratory: Negative.    Cardiovascular:  Positive for palpitations. Negative for chest pain and leg swelling.  Gastrointestinal: Negative.   Neurological: Negative.   Psychiatric/Behavioral: Negative.      Per HPI unless specifically indicated above     Objective:    BP 103/65   Pulse 99   SpO2 96%   Wt Readings from Last 3 Encounters:  11/29/22 180 lb (81.6 kg)  10/23/22 185 lb (83.9 kg)  10/05/22 185 lb (83.9 kg)    Physical Exam Vitals and nursing note reviewed.  Constitutional:      General: She is not in acute distress.    Appearance: Normal appearance. She is not ill-appearing, toxic-appearing or diaphoretic.  HENT:     Head: Normocephalic and atraumatic.     Right Ear: External ear normal.     Left Ear: External ear normal.     [name]: [name] normal.     Mouth/Throat:     Mouth: Mucous membranes are moist.     Pharynx: Oropharynx is clear.  Eyes:     General: No scleral icterus.       Right eye: No discharge.        Left eye: No discharge.     Extraocular Movements: Extraocular movements intact.     Conjunctiva/sclera: Conjunctivae normal.     Pupils: Pupils are equal, round, and reactive to light.  Cardiovascular:     Rate and Rhythm: Regular rhythm. Tachycardia present.     Pulses: Normal pulses.  Heart sounds: Normal heart sounds. No murmur heard.    No friction rub. No gallop.  Pulmonary:     Effort: Pulmonary effort is normal. No respiratory distress.     Breath sounds: Normal breath sounds. No stridor. No wheezing, rhonchi or rales.  Chest:     Chest wall: No tenderness.  Musculoskeletal:        General: Normal range of motion.     Cervical back: Normal range of motion and neck supple.  Skin:    General: Skin is warm and dry.      Capillary Refill: Capillary refill takes less than 2 seconds.     Coloration: Skin is not jaundiced or pale.     Findings: No bruising, erythema, lesion or rash.  Neurological:     General: No focal deficit present.     Mental Status: She is alert and oriented to person, place, and time. Mental status is at baseline.  Psychiatric:        Mood and Affect: Mood normal.        Behavior: Behavior normal.        Thought Content: Thought content normal.        Judgment: Judgment normal.     Results for orders placed or performed in visit on 11/29/22  VAS [place] ABI WITH/WO TBI  Result Value Ref Range   Right ABI 1.12    Left ABI .85    *Note: Due to a large number of results and/or encounters for the requested time period, some results have not been displayed. A complete set of results can be found in Results Review.      Assessment & Plan:   Problem List Items Addressed This Visit       Endocrine   Diabetes mellitus, type II (HCC) - Primary (Chronic)    Rechecking A1c today. Await results. Treat as needed.       Relevant Orders   Hgb A1c w/o eAG   Hypothyroid    Rechecking labs today. Await results. Treat as needed.       Relevant Medications   metoprolol tartrate (LOPRESSOR) 50 MG tablet   Other Visit Diagnoses     Tachycardia       Will increase her metoprolol to 50mg  BID and check zio. Will recheck in about 2 weeks- may need to get her back into cardiology.   Relevant Orders   EKG 12-Lead (Completed)   LONG TERM MONITOR (3-14 DAYS)        Follow up plan: Return in about 2 weeks (around 12/19/2022).

## 2022-12-05 NOTE — Telephone Encounter (Signed)
Requested Prescriptions  Refused Prescriptions Disp Refills   glipiZIDE (GLUCOTROL) 5 MG tablet [Pharmacy Med Name: GLIPIZIDE 5 MG TAB] 90 tablet 1    Sig: TAKE 1 TABLET BY MOUTH ONCE DAILY BEFOREBREAKFAST     Endocrinology:  Diabetes - Sulfonylureas Failed - 12/04/2022 12:04 PM      Failed - Cr in normal range and within 360 days    Creatinine  Date Value Ref Range Status  09/20/2011 1.31 (H) 0.60 - 1.30 mg/dL Final   Creatinine, Ser  Date Value Ref Range Status  10/23/2022 1.63 (H) 0.44 - 1.00 mg/dL Final         Passed - HBA1C is between 0 and 7.9 and within 180 days    Hemoglobin A1C  Date Value Ref Range Status  11/23/2021 7.5  Final   HB A1C (BAYER DCA - WAIVED)  Date Value Ref Range Status  06/20/2022 6.5 (H) 4.8 - 5.6 % Final    Comment:             Prediabetes: 5.7 - 6.4          Diabetes: >6.4          Glycemic control for adults with diabetes: <7.0    Hgb A1c MFr Bld  Date Value Ref Range Status  09/04/2022 6.5 (H) 4.8 - 5.6 % Final    Comment:             Prediabetes: 5.7 - 6.4          Diabetes: >6.4          Glycemic control for adults with diabetes: <7.0          Passed - Valid encounter within last 6 months    Recent Outpatient Visits           3 months ago Essential hypertension   [place] [facility] [place], Megan P, DO   3 months ago Chronic midline back pain, unspecified back location   [place] [facility] [place], Megan P, DO   5 months ago Type 2 diabetes mellitus with diabetic peripheral angiopathy without gangrene, without long-term current use of insulin (HCC)   [facility] [place], Megan P, DO   6 months ago Bartholin gland cyst   [place] [facility] [place], Megan P, DO   7 months ago Uncontrolled type 2 diabetes mellitus with hyperglycemia, with long-term current use of insulin [facility])   [place] [facility] [place], [place], DO       Future  Appointments             Today Dorcas Carrow, DO [place] [facility], [facility]   In 3 days Glenford Bayley, NP [facility] at [facility]

## 2022-12-05 NOTE — Telephone Encounter (Signed)
Requested Prescriptions  Pending Prescriptions Disp Refills   trimethoprim (TRIMPEX) 100 MG tablet [Pharmacy Med Name: TRIMETHOPRIM 100 MG TAB] 90 tablet 0    Sig: TAKE ONE TABLET BY MOUTH EVERY DAY     Off-Protocol Failed - 12/04/2022 12:04 PM      Failed - Medication not assigned to a protocol, review manually.      Passed - Valid encounter within last 12 months    Recent Outpatient Visits           3 months ago Essential hypertension   [place] [facility] [place], [place] P, DO   3 months ago Chronic midline back pain, unspecified back location   [place] [facility], Megan P, DO   5 months ago Type 2 diabetes mellitus with diabetic peripheral angiopathy without gangrene, without long-term current use of insulin (HCC)   [place] [place], Megan P, DO   6 months ago Bartholin gland cyst   [place] [facility] [place], Megan P, DO   7 months ago Uncontrolled type 2 diabetes mellitus with hyperglycemia, with long-term current use of insulin [facility])    [facility] [place], Oralia Rud, DO       Future Appointments             Today Dorcas Carrow, DO  [facility], [facility]   In 3 days Glenford Bayley, NP [facility] Care at [facility]

## 2022-12-05 NOTE — Assessment & Plan Note (Signed)
Rechecking A1c today. Await results. Treat as needed.

## 2022-12-06 LAB — HGB A1C W/O EAG: Hgb A1c MFr Bld: 6.6 % — ABNORMAL HIGH (ref 4.8–5.6)

## 2022-12-06 LAB — TSH: TSH: 0.048 u[IU]/mL — ABNORMAL LOW (ref 0.450–4.500)

## 2022-12-07 ENCOUNTER — Other Ambulatory Visit: Payer: Self-pay | Admitting: Family Medicine

## 2022-12-07 DIAGNOSIS — E039 Hypothyroidism, unspecified: Secondary | ICD-10-CM

## 2022-12-07 MED ORDER — LEVOTHYROXINE SODIUM 100 MCG PO TABS: 100.0000 ug | ORAL_TABLET | Freq: Every day | ORAL | 1 refills | Status: DC

## 2022-12-08 ENCOUNTER — Encounter: Payer: Self-pay | Admitting: Primary Care

## 2022-12-08 ENCOUNTER — Telehealth (INDEPENDENT_AMBULATORY_CARE_PROVIDER_SITE_OTHER): Payer: Medicare Other | Admitting: Primary Care

## 2022-12-08 DIAGNOSIS — R000 Tachycardia, unspecified: Secondary | ICD-10-CM | POA: Diagnosis not present

## 2022-12-08 DIAGNOSIS — J449 Chronic obstructive pulmonary disease, unspecified: Secondary | ICD-10-CM | POA: Diagnosis not present

## 2022-12-08 NOTE — Progress Notes (Signed)
Interpreted by me 12/05/22. Sinus tachycardia at 104bpm

## 2022-12-08 NOTE — Progress Notes (Unsigned)
Virtual Visit via Video Note  I connected with Tammy [name] on 12/08/22 at  3:00 PM EDT by a video enabled telemedicine application and verified that I am speaking with the correct person using two identifiers.  Location: Patient: Office Provider:    I discussed the limitations of evaluation and management by telemedicine and the availability of in person appointments. The patient expressed understanding and agreed to proceed.  History of Present Illness: 77 year old female, former smoker quit 2009 (7.5-pack-year history).  Past medical history significant for COPD.  Patient of Dr. Craige Cotta, last seen in office on 07/14/2021.  Previous [facility] pulmonary encounter:  09/09/2021 Patient presents today for 1 to 83-month follow-up COPD.  During her last office visit she was noted to have mild COPD exacerbation in setting of allergic rhinitis.  It was decided to hold off on antibiotics since recently on 2 separate ones for UTI.  Patient is concerned about being on prednisone due to history of diabetes.  She was recommended to try Flonase and Astepro nasal spray.   She experiences shortness of breath with exertion. No acute respiratory complaints today. Allergy symptoms improved with nasal spray. Trelegy is too expensive for her. She has medication but only uses it as needed. Albuterol helps. Denies chest tightness, wheezing or cough.  She continues to have left sided chest pain. Pain has been going on for 1-2 months. Muscle relaxor and tylenol have not helped. She has tried icy hot and lidocaine patches. She had breast surgery in June 2020. Hurts to raise her arm up, range of motion is limited.    12/08/2022- Interim hx  Patient contacted today for virtual visit.  Followed by our office for moderate-severe COPD/ asthma/FEV1 52% predicted.  She was last seen in June 2023.  Trelegy was not affordable, recommended she apply for patient assistance. She is maintained on as needed albuterol.    She is doing pretty  good. She has not needed to use the rescue inhaler. She is taking TRELEGY daily, inhaler is not costing her. During her last visit  she reported left sided pleuritic chest pain. She had breast excision in June 2020. Mammogram in May 2023 showed post lumpectomy changes in the breast. No mammographic evidence of malignancy. Rib xray in June 2023 showed no acute findings, stable left upper lobe scarring. CT chest in October 2023 showed new acute/subacute fractures left 3rd and 4th ribs. No evidence of pneumothorax, chest wall hematoma or osseous metastatic disease. She was treated with oxycodone, she saw pain management in July 2024. She ambulates with walker/cane, she has a scooter as well. No recent falls. She was hospitalized in December 2023 for sepsis.   6 months   Observations/Objective:   Assessment and Plan:   Follow Up Instructions:    I discussed the assessment and treatment plan with the patient. The patient was provided an opportunity to ask questions and all were answered. The patient agreed with the plan and demonstrated an understanding of the instructions.   The patient was advised to call back or seek an in-person evaluation if the symptoms worsen or if the condition fails to improve as anticipated.  I provided *** minutes of non-face-to-face time during this encounter.   Glenford Bayley, NP

## 2022-12-10 NOTE — Patient Instructions (Signed)
No changes Continue Trelegy daily  FU in 6 months

## 2022-12-11 NOTE — Progress Notes (Signed)
Reviewed and agree with assessment/plan.   Coralyn Helling, MD [facility] Pulmonary/Critical Care 12/11/2022, 8:20 AM Pager:  [phone]

## 2022-12-25 ENCOUNTER — Encounter: Payer: Self-pay | Admitting: Family Medicine

## 2022-12-25 ENCOUNTER — Ambulatory Visit (INDEPENDENT_AMBULATORY_CARE_PROVIDER_SITE_OTHER): Payer: Medicare Other | Admitting: Family Medicine

## 2022-12-25 VITALS — BP 96/62 | HR 67

## 2022-12-25 DIAGNOSIS — R1313 Dysphagia, pharyngeal phase: Secondary | ICD-10-CM

## 2022-12-25 DIAGNOSIS — N3000 Acute cystitis without hematuria: Secondary | ICD-10-CM

## 2022-12-25 DIAGNOSIS — R000 Tachycardia, unspecified: Secondary | ICD-10-CM

## 2022-12-25 DIAGNOSIS — R339 Retention of urine, unspecified: Secondary | ICD-10-CM | POA: Diagnosis not present

## 2022-12-25 DIAGNOSIS — M545 Low back pain, unspecified: Secondary | ICD-10-CM | POA: Diagnosis not present

## 2022-12-25 LAB — URINALYSIS, ROUTINE W REFLEX MICROSCOPIC
Bilirubin, UA: NEGATIVE
Glucose, UA: NEGATIVE
Ketones, UA: NEGATIVE
Nitrite, UA: POSITIVE — AB
Specific Gravity, UA: 1.02 (ref 1.005–1.030)
Urobilinogen, Ur: 0.2 mg/dL (ref 0.2–1.0)
pH, UA: 6 (ref 5.0–7.5)

## 2022-12-25 LAB — MICROSCOPIC EXAMINATION

## 2022-12-25 MED ORDER — CIPROFLOXACIN HCL 500 MG PO TABS: 500.0000 mg | ORAL_TABLET | Freq: Two times a day (BID) | ORAL | 0 refills | Status: DC

## 2022-12-25 NOTE — Progress Notes (Signed)
BP 96/62 (BP Location: Right Arm, Cuff Size: Normal)   Pulse 67   SpO2 99%    Subjective:    Patient ID: Tammy [name], female    DOB: September 30, 1945, 77 y.o.   MRN: [id number]  HPI: Tammy [name] is a 77 y.o. female  Chief Complaint  Patient presents with   Palpitations    Patient says she is feeling better and says she thinks the prescription for Metoprolol helped. Patient says she wore the heart monitor and she did not noticed any more issues with it.    Urinary Retention    Patient says she feels as if she not using the restroom and she has to sit and wait until she can go and then it is just a dribble. Patient says she became symptomatic about two weeks ago.    Palpitations have resolved. Tolerating her metoprolol well. No concerns.   URINARY SYMPTOMS Duration: about 2 weeks Dysuria: no Urinary frequency: yes Urgency: no Small volume voids: yes Symptom severity: moderate Urinary incontinence: yes Foul odor: yes Hematuria: no Abdominal pain: yes Back pain: yes Suprapubic pain/pressure: yes Flank pain: no Fever:  no Vomiting: no Status: worse Previous urinary tract infection: yes Recurrent urinary tract infection: no History of sexually transmitted disease: no Vaginal discharge: yes Treatments attempted: increasing fluids   DYSPHAGIA Duration: months Description of symptom: something gets caught in her throat Onset: Several seconds after swallowing Location of dysphagia: throat Dysphagia to solids only: yes Dysphagia to solids & liquids: no  Frequency:intermittent  Progressively getting worse: yes Alleviatiating factors: nothing Provoking factors: certain foods Status: worse EGD: no Weight loss: no Sensation of lump in throat: yes Heartburn: no Odynophagia: no Nausea: no Vomiting: no Drooling/nasal regurgitation/food spillage: no Coughing/choking/dysphonia: no Dysarthria: no Hematemesis: no Regurgitation of undigested food/halitosis: no Chest pain:  no   Relevant past medical, surgical, family and social history reviewed and updated as indicated. Interim medical history since our last visit reviewed. Allergies and medications reviewed and updated.  Review of Systems  Constitutional: Negative.   Respiratory: Negative.    Cardiovascular: Negative.   Genitourinary:  Positive for decreased urine volume, difficulty urinating, flank pain, frequency, urgency and vaginal discharge. Negative for dyspareunia, dysuria, enuresis, genital sores, hematuria, menstrual problem, pelvic pain, vaginal bleeding and vaginal pain.  Musculoskeletal:  Positive for back pain and myalgias. Negative for arthralgias, gait problem, joint swelling, neck pain and neck stiffness.  Psychiatric/Behavioral: Negative.      Per HPI unless specifically indicated above     Objective:    BP 96/62 (BP Location: Right Arm, Cuff Size: Normal)   Pulse 67   SpO2 99%   Wt Readings from Last 3 Encounters:  11/29/22 180 lb (81.6 kg)  10/23/22 185 lb (83.9 kg)  10/05/22 185 lb (83.9 kg)    Physical Exam Vitals and nursing note reviewed.  Constitutional:      General: She is not in acute distress.    Appearance: Normal appearance. She is obese. She is not ill-appearing, toxic-appearing or diaphoretic.  HENT:     Head: Normocephalic and atraumatic.     Right Ear: External ear normal.     Left Ear: External ear normal.     [name]: [name] normal.     Mouth/Throat:     Mouth: Mucous membranes are moist.     Pharynx: Oropharynx is clear.  Eyes:     General: No scleral icterus.       Right eye: No discharge.  Left eye: No discharge.     Extraocular Movements: Extraocular movements intact.     Conjunctiva/sclera: Conjunctivae normal.     Pupils: Pupils are equal, round, and reactive to light.  Cardiovascular:     Rate and Rhythm: Normal rate and regular rhythm.     Pulses: Normal pulses.     Heart sounds: Normal heart sounds. No murmur heard.    No friction rub.  No gallop.  Pulmonary:     Effort: Pulmonary effort is normal. No respiratory distress.     Breath sounds: Normal breath sounds. No stridor. No wheezing, rhonchi or rales.  Chest:     Chest wall: No tenderness.  Abdominal:     Tenderness: There is no right CVA tenderness or left CVA tenderness.  Musculoskeletal:        General: Normal range of motion.     Cervical back: Normal range of motion and neck supple.  Skin:    General: Skin is warm and dry.     Capillary Refill: Capillary refill takes less than 2 seconds.     Coloration: Skin is not jaundiced or pale.     Findings: No bruising, erythema, lesion or rash.  Neurological:     General: No focal deficit present.     Mental Status: She is alert and oriented to person, place, and time. Mental status is at baseline.  Psychiatric:        Mood and Affect: Mood normal.        Behavior: Behavior normal.        Thought Content: Thought content normal.        Judgment: Judgment normal.     Results for orders placed or performed in visit on 12/05/22  Hgb A1c w/o eAG  Result Value Ref Range   Hgb A1c MFr Bld 6.6 (H) 4.8 - 5.6 %  TSH  Result Value Ref Range   TSH 0.048 (L) 0.450 - 4.500 uIU/mL   *Note: Due to a large number of results and/or encounters for the requested time period, some results have not been displayed. A complete set of results can be found in Results Review.      Assessment & Plan:   Problem List Items Addressed This Visit       Digestive   Dysphagia    Will get into speech therapy for evaluation and barium study if needed.       Relevant Orders   Ambulatory referral to Speech Therapy   Other Visit Diagnoses     Acute cystitis without hematuria    -  Primary   Will treat with cipro. Call if not getting better or getting worse. Recheck 1 week.   Relevant Orders   Urine Culture   Urinary retention       3+ Leuks and + nit   Relevant Orders   Urinalysis, Routine w reflex microscopic   Tachycardia        Resolved. BP running low- may need to cut imdur if still low after UTI treated. Recheck 1 week.        Follow up plan: Return in about 1 week (around 01/01/2023).

## 2022-12-25 NOTE — Assessment & Plan Note (Signed)
Will get into speech therapy for evaluation and barium study if needed.

## 2022-12-26 DIAGNOSIS — N3000 Acute cystitis without hematuria: Secondary | ICD-10-CM | POA: Diagnosis not present

## 2022-12-27 DIAGNOSIS — R000 Tachycardia, unspecified: Secondary | ICD-10-CM | POA: Diagnosis not present

## 2022-12-29 LAB — URINE CULTURE

## 2023-01-01 ENCOUNTER — Encounter: Payer: Self-pay | Admitting: Family Medicine

## 2023-01-01 ENCOUNTER — Ambulatory Visit: Payer: Medicare Other | Admitting: Family Medicine

## 2023-01-01 VITALS — BP 112/72 | HR 71 | Temp 97.8°F | Ht 66.0 in | Wt 177.8 lb

## 2023-01-01 DIAGNOSIS — I959 Hypotension, unspecified: Secondary | ICD-10-CM

## 2023-01-01 DIAGNOSIS — N3000 Acute cystitis without hematuria: Secondary | ICD-10-CM | POA: Diagnosis not present

## 2023-01-01 LAB — URINALYSIS, ROUTINE W REFLEX MICROSCOPIC
Bilirubin, UA: NEGATIVE
Glucose, UA: NEGATIVE
Ketones, UA: NEGATIVE
Nitrite, UA: NEGATIVE
RBC, UA: NEGATIVE
Specific Gravity, UA: 1.025 (ref 1.005–1.030)
Urobilinogen, Ur: 0.2 mg/dL (ref 0.2–1.0)
pH, UA: 5.5 (ref 5.0–7.5)

## 2023-01-01 LAB — MICROSCOPIC EXAMINATION: Bacteria, UA: NONE SEEN

## 2023-01-01 NOTE — Progress Notes (Signed)
BP 112/72 (BP Location: Right Arm, Patient Position: Sitting, Cuff Size: Large)   Pulse 71   Temp 97.8 F (36.6 C) (Oral)   Ht 5\' 6"  (1.676 m)   Wt 177 lb 12.8 oz (80.6 kg)   SpO2 98%   BMI 28.70 kg/m    Subjective:    Patient ID: Tammy [name], female    DOB: 01-13-1946, 77 y.o.   MRN: [id number]  HPI: Tammy [name] is a 77 y.o. female  Chief Complaint  Patient presents with   Urinary Tract Infection    1 week follow up for UTI. Feels she may not be completely over UTI but does feel much better, finished medication last night    URINARY SYMPTOMS Duration: about weeks Dysuria: no Urinary frequency: no Urgency: no Small volume voids: yes Symptom severity: mild Urinary incontinence: no Foul odor: no Hematuria: no Abdominal pain: no Back pain: yes Suprapubic pain/pressure: no Flank pain: no Fever:  no Vomiting: no Relief with cranberry juice: no Relief with pyridium: no Status: better Previous urinary tract infection: yes Recurrent urinary tract infection: no History of sexually transmitted disease: no Vaginal discharge: no Treatments attempted: antibiotics and increasing fluids    Relevant past medical, surgical, family and social history reviewed and updated as indicated. Interim medical history since our last visit reviewed. Allergies and medications reviewed and updated.  Review of Systems  Constitutional: Negative.   Respiratory: Negative.    Cardiovascular: Negative.   Genitourinary: Negative.   Musculoskeletal: Negative.   Psychiatric/Behavioral: Negative.      Per HPI unless specifically indicated above     Objective:    BP 112/72 (BP Location: Right Arm, Patient Position: Sitting, Cuff Size: Large)   Pulse 71   Temp 97.8 F (36.6 C) (Oral)   Ht 5\' 6"  (1.676 m)   Wt 177 lb 12.8 oz (80.6 kg)   SpO2 98%   BMI 28.70 kg/m   Wt Readings from Last 3 Encounters:  01/01/23 177 lb 12.8 oz (80.6 kg)  11/29/22 180 lb (81.6 kg)  10/23/22 185 lb (83.9  kg)    Physical Exam Vitals and nursing note reviewed.  Constitutional:      General: She is not in acute distress.    Appearance: Normal appearance. She is not ill-appearing, toxic-appearing or diaphoretic.  HENT:     Head: Normocephalic and atraumatic.     Right Ear: External ear normal.     Left Ear: External ear normal.     [name]: [name] normal.     Mouth/Throat:     Mouth: Mucous membranes are moist.     Pharynx: Oropharynx is clear.  Eyes:     General: No scleral icterus.       Right eye: No discharge.        Left eye: No discharge.     Extraocular Movements: Extraocular movements intact.     Conjunctiva/sclera: Conjunctivae normal.     Pupils: Pupils are equal, round, and reactive to light.  Cardiovascular:     Rate and Rhythm: Normal rate and regular rhythm.     Pulses: Normal pulses.     Heart sounds: Normal heart sounds. No murmur heard.    No friction rub. No gallop.  Pulmonary:     Effort: Pulmonary effort is normal. No respiratory distress.     Breath sounds: Normal breath sounds. No stridor. No wheezing, rhonchi or rales.  Chest:     Chest wall: No tenderness.  Musculoskeletal:  General: Normal range of motion.     Cervical back: Normal range of motion and neck supple.  Skin:    General: Skin is warm and dry.     Capillary Refill: Capillary refill takes less than 2 seconds.     Coloration: Skin is not jaundiced or pale.     Findings: No bruising, erythema, lesion or rash.  Neurological:     General: No focal deficit present.     Mental Status: She is alert and oriented to person, place, and time. Mental status is at baseline.  Psychiatric:        Mood and Affect: Mood normal.        Behavior: Behavior normal.        Thought Content: Thought content normal.        Judgment: Judgment normal.     Results for orders placed or performed in visit on 12/25/22  Urine Culture   Specimen: Urine   UR  Result Value Ref Range   Urine Culture, Routine Final  report (A)    Organism ID, Bacteria Comment (A)    Antimicrobial Susceptibility Comment   Microscopic Examination   Urine  Result Value Ref Range   WBC, UA >30W 0 - 5 /hpf   RBC, Urine 0-2 0 - 2 /hpf   Epithelial Cells (non renal) 0-10 0 - 10 /hpf   Bacteria, UA Few None seen/Few  Urinalysis, Routine w reflex microscopic  Result Value Ref Range   Specific Gravity, UA 1.020 1.005 - 1.030   pH, UA 6.0 5.0 - 7.5   Color, UA Yellow Yellow   Appearance Ur Cloudy (A) Clear   Leukocytes,UA 3+ (A) Negative   Protein,UA 1+ (A) Negative/Trace   Glucose, UA Negative Negative   Ketones, UA Negative Negative   RBC, UA 1+ (A) Negative   Bilirubin, UA Negative Negative   Urobilinogen, Ur 0.2 0.2 - 1.0 mg/dL   Nitrite, UA Positive (A) Negative   Microscopic Examination See below:    *Note: Due to a large number of results and/or encounters for the requested time period, some results have not been displayed. A complete set of results can be found in Results Review.      Assessment & Plan:   Problem List Items Addressed This Visit   None Visit Diagnoses     Acute cystitis without hematuria    -  Primary   Feelign much better. Will recheck urine and treat as needed.   Relevant Orders   Urinalysis, Routine w reflex microscopic   Urine Culture   Hypotension, unspecified hypotension type       Resolved. BP good. Continue to monitor.        Follow up plan: Return End of the year for regular follow up.

## 2023-01-03 ENCOUNTER — Telehealth: Payer: Self-pay | Admitting: Family Medicine

## 2023-01-03 LAB — URINE CULTURE: Organism ID, Bacteria: NO GROWTH

## 2023-01-03 NOTE — Telephone Encounter (Signed)
Reached out to patient via MyChart to inform her that her request lab results have not been received. Advised once they are received, someone from the clinical staff will reach out to her. Advised patient to reach out if she has any questions or concerns.

## 2023-01-03 NOTE — Telephone Encounter (Signed)
Copied from CRM [id number]. Topic: General - Other >> Jan 03, 2023  9:55 AM Franchot Heidelberg wrote: Reason for CRM: Pt called back for lab results regarding her urine culture

## 2023-01-04 ENCOUNTER — Other Ambulatory Visit: Payer: Self-pay | Admitting: Internal Medicine

## 2023-01-10 ENCOUNTER — Ambulatory Visit: Payer: Medicare Other | Attending: Family Medicine | Admitting: Speech Pathology

## 2023-01-10 DIAGNOSIS — R1313 Dysphagia, pharyngeal phase: Secondary | ICD-10-CM | POA: Diagnosis not present

## 2023-01-11 ENCOUNTER — Other Ambulatory Visit: Payer: Self-pay | Admitting: Family Medicine

## 2023-01-11 NOTE — Therapy (Signed)
OUTPATIENT SPEECH LANGUAGE PATHOLOGY  SWALLOW EVALUATION ONLY   Patient Name: Tammy [name] MRN: [id number] DOB:02-02-1946, 78 y.o., female Today's Date: 01/10/2023  PCP: Olevia Perches, DO REFERRING PROVIDER: Olevia Perches, DO   End of Session - 01/10/23 0832     Visit Number 1    SLP Start Time 1400    SLP Stop Time  1445    SLP Time Calculation (min) 45 min    Activity Tolerance Patient tolerated treatment well             Past Medical History:  Diagnosis Date   Asthma    Benign neoplasm of colon    Breast cancer (HCC) 08/2018   left breast   Cervical disc disease    "bulging" - no limitations per pt   Chronic diastolic CHF (congestive heart failure) (HCC)    a. 07/2012 Echo: Nl EF. mild inferoseptal HK, Gr 2 DD, mild conc LVH, mild PR/TR, mild PAH; b. 08/2019 Echo: EF 55-60%, no rwma, Gr1 DD. Nl RV size/fxn.   CKD (chronic kidney disease), stage III (HCC)    COPD (chronic obstructive pulmonary disease) (HCC)    Coronary artery disease    a. 11/2011 Cath: LAD 34m/d, LCX min irregs, RCA 70p, 128m with L->R collats, EF 60%-->Med Rx; b. 03/2019 Cath: LM nl, LAD 40-44m. LCX large, mild diff dzs. OM1/2 nl. RCA known to be 159m. L-->R collats from LAD & LCX/OM.   Diabetes mellitus without complication (HCC)    Diagnosis unknown 05/07/2022   Fusion of lumbar spine 12/22/2015   GERD (gastroesophageal reflux disease)    History of DVT (deep vein thrombosis)    History of tobacco abuse    a. Quit 2011.   Hyperlipidemia    Hypertension    Hypothyroidism    Lung cancer (HCC)    Malignant neoplasm of unspecified site of right female breast (HCC) 03/14/1999   Obesity    Palpitations    Personal history of radiation therapy    PONV (postoperative nausea and vomiting)    Pulmonary embolus (HCC) 12/25/2013   RLL. Presented with SOB and elevated d-dimer.    PVD (peripheral vascular disease) (HCC)    a. PTA of right leg and left femoral artery with stenosis; b. 09/2020 ABI: R 0.93,  L 0.88 - unchanged from prior study.   Stroke [facility]) 2015   TIAs - no deficits   Past Surgical History:  Procedure Laterality Date   ANGIOPLASTY / STENTING FEMORAL     PVD; angioplasty right leg and left femoral artery with stenosis.    APPENDECTOMY  12/2014   BACK SURGERY  03/2012   nerve stimulator inserted   BACK SURGERY     screws, rods replaced with new hardware.   BACK SURGERY  11/2016   BREAST LUMPECTOMY Left 08/26/2018   BREAST SURGERY     CARDIAC CATHETERIZATION  12/07/2011   Mid LAD 50%, distal LAD 50%, mid RCA 70%, distal RCA 100%    CARDIAC CATHETERIZATION  01/04/2011   100% occluded mid RCA with good collaterals from distal LAD, normal LVEF.   CATARACT EXTRACTION Right 12/21/2021   CHOLECYSTECTOMY     COLONOSCOPY  2013   COLONOSCOPY WITH PROPOFOL N/A 05/24/2015   Procedure: COLONOSCOPY WITH PROPOFOL;  Surgeon: Midge Minium, MD;  Location: [facility];  Service: Endoscopy;  Laterality: N/A;  Diabetic - oral meds   COLONOSCOPY WITH PROPOFOL N/A 01/28/2019   Procedure: COLONOSCOPY WITH PROPOFOL;  Surgeon: Pasty Spillers, MD;  Location:  [facility] END[facility];  Service: Endoscopy;  Laterality: N/A;   DIAGNOSTIC LAPAROSCOPY     ESOPHAGOGASTRODUODENOSCOPY (EGD) WITH PROPOFOL N/A 01/28/2019   Procedure: ESOPHAGOGASTRODUODENOSCOPY (EGD) WITH PROPOFOL;  Surgeon: Pasty Spillers, MD;  Location: [facility] ENDOSCOPY;  Service: Endoscopy;  Laterality: N/A;   LAPAROSCOPIC APPENDECTOMY N/A 01/06/2015   Procedure: APPENDECTOMY LAPAROSCOPIC;  Surgeon: Kieth Brightly, MD;  Location: [facility] ORS;  Service: General;  Laterality: N/A;   LEFT HEART CATH AND CORONARY ANGIOGRAPHY Left 04/04/2019   Procedure: LEFT HEART CATH AND CORONARY ANGIOGRAPHY;  Surgeon: Antonieta Iba, MD;  Location: [facility] IN[facility];  Service: Cardiovascular;  Laterality: Left;   LOWER EXTREMITY INTERVENTION Right 10/23/2022   Procedure: Lower Extremity Angiography;  Surgeon: Annice Needy, MD;  Location:  [facility];  Service: Cardiovascular;  Laterality: Right;   MASTECTOMY W/ SENTINEL NODE BIOPSY Left 08/26/2018   Procedure: PARTIAL MASTECTOMY WIDE EXCISION WITH SENTINEL LYMPH NODE BIOPSY LEFT;  Surgeon: Earline Mayotte, MD;  Location: [facility] OR[facility];  Service: General;  Laterality: Left;   POLYPECTOMY  05/24/2015   Procedure: POLYPECTOMY;  Surgeon: Midge Minium, MD;  Location: [facility];  Service: Endoscopy;;   REPLACEMENT TOTAL KNEE Right    RIGHT/LEFT HEART CATH AND CORONARY ANGIOGRAPHY Bilateral 09/26/2021   Procedure: RIGHT/LEFT HEART CATH AND CORONARY ANGIOGRAPHY;  Surgeon: Iran Ouch, MD;  Location: [facility] INV[facility] CV LAB;  Service: Cardiovascular;  Laterality: Bilateral;   THYROIDECTOMY     TOTAL KNEE ARTHROPLASTY Right 11/13/2019   TRIGGER FINGER RELEASE     Patient Active Problem List   Diagnosis Date Noted   Fixation hardware in spine 09/19/2022   Lumbar facet joint syndrome (Bilateral) 09/19/2022   Lumbar facet arthropathy (Multilevel) (Bilateral) 09/19/2022   Vitamin D deficiency 09/04/2022   Osteoarthritis of knees (Bilateral) 08/21/2022   Lumbar facet joint pain 08/21/2022   Pharmacologic therapy 06/28/2022   Disorder of skeletal system 06/28/2022   Problems influencing health status 06/28/2022   Grade 1 Anterolisthesis of cervical spine (C3/C4, C4/C5) 06/28/2022   Abnormal MRI, cervical spine (11/16/2021) 06/28/2022   Cervical spinal stenosis (C5-6, C6-7) 06/28/2022   Cervical foraminal stenosis 06/28/2022   Cervical facet arthropathy 06/28/2022   Cervical facet hypertrophy (Multilevel) 06/28/2022   Abnormal x-ray of cervical spine (10/01/2020) 06/28/2022   Osteoarthritis of AC (acromioclavicular) joint (Left) 06/28/2022   Osteoarthritis of glenohumeral joint (Left) 06/28/2022   Osteoarthritis of shoulder (Left) 06/28/2022   Neurostimulator device in situ (T8-9 leads) 06/28/2022   Grade 1 Anterolisthesis of lumbar spine (L4/L5) 06/28/2022   Grade  1 Retrolisthesis of lumbar spine (L2/L3, L3/L4) 06/28/2022   DDD (degenerative disc disease), cervical 06/28/2022   DDD (degenerative disc disease), lumbar 06/28/2022   Abnormal MRI, lumbar spine (04/17/2017) 06/28/2022   History of lumbar spinal fusion 06/28/2022   Failed back surgical syndrome 06/28/2022   Abnormal CT scan, lumbar spine (02/14/2019) 06/28/2022   Osteoarthritis of knee (Right) 06/28/2022   Osteoarthritis of knee (Left) 06/28/2022   Chronic knee pain (3ry area of Pain) (Bilateral) (R>L) 06/28/2022   Coccygodynia 06/28/2022   Chronic lower extremity pain (2ry area of Pain) (Right) 06/28/2022   Leg cramps 06/28/2022   Chronic knee pain s/p TKR (Right) 06/28/2022   Chronic neck pain (4th area of Pain) (Bilateral) (R>L) 06/28/2022   Spinal cord stimulator dysfunction, sequela 06/28/2022   Body mass index (BMI) 33.0-33.9, adult 03/13/2022   Acute metabolic encephalopathy 03/08/2022   Personal history of pneumonia (recurrent) 02/24/2022   Primary cancer of left  upper lobe of lung (HCC) 01/10/2022   Osteoarthritis of spine with radiculopathy, cervical region 10/20/2021   Osteoarthritis of spine with radiculopathy, thoracic region 10/20/2021   Acute respiratory failure with hypoxia (HCC) 06/09/2021   Chronic anticoagulation (Eliquis) 06/09/2021   Elevated lipase 06/09/2021   Stage 3b chronic kidney disease (HCC) 06/08/2021   Long term (current) use of opiate analgesic 03/13/2021   Atherosclerosis of native arteries of extremity with intermittent claudication (HCC) 02/15/2021   Cervical radiculopathy 10/14/2020   Sacroiliac inflammation (HCC) 10/14/2020   Lung nodule 08/19/2020   Fall 04/17/2020   Anemia 02/16/2020   Asthma 02/16/2020   History of cardiac catheterization 02/16/2020   Left breast cancer s/p mastectomy 02/16/2020   Malignant tumor of breast (HCC) 02/16/2020   History of total knee arthroplasty 11/18/2019   Unstable angina (HCC) 04/03/2019   Dysphagia     Thickening of esophagus    Stomach irritation    Gastric polyp    Positive occult stool blood test    Polyp of colon    Proteinuria 11/27/2018   Diabetes mellitus, type II (HCC) 11/27/2018   Carcinoma of lower-inner quadrant of left breast in female, estrogen receptor positive (HCC) 10/02/2018   Chronic knee pain (Right) 08/01/2017   Chronic low back pain (1ry area of Pain) (Bilateral) (R>L) w/o sciatica 05/28/2017   Gait abnormality 05/28/2017   Hypothyroid 07/18/2016   COPD (chronic obstructive pulmonary disease) (HCC) 07/18/2016   Encephalomalacia on imaging study 12/10/2015   Calcification of both carotid arteries 12/10/2015   Benign neoplasm of sigmoid colon    Benign neoplasm of cecum    Benign neoplasm of ascending colon    Bilateral atelectasis 04/11/2015   Fatty liver disease, nonalcoholic 04/11/2015   Hydronephrosis of right kidney 04/11/2015   Abdominal aortic ectasia (HCC) 04/11/2015   Constipation 04/07/2015   Abdominal aortic atherosclerosis (HCC) 12/18/2014   Diverticulosis of colon 12/18/2014   Intermittent left-sided chest pain 11/18/2014   Rhinitis, allergic 10/20/2014   Coronary artery disease    Essential hypertension    Morbid obesity due to excess calories (HCC)    Herniated nucleus pulposus 06/06/2014   Neuralgia neuritis, sciatic nerve 05/24/2014   Pulmonary embolism (HCC) 01/30/2014   Benign hypertensive renal disease 12/16/2013   Uncontrolled type 2 diabetes mellitus with hyperglycemia, with long-term current use of insulin (HCC) 12/16/2013   Overactive bladder 11/03/2013   Coronary atherosclerosis of native coronary artery 06/19/2013   Exertional dyspnea 06/19/2013   History of back surgery 06/19/2013   Hyperlipidemia 06/19/2013   PAD (peripheral artery disease) (HCC) 06/19/2013   Congenital obstruction of ureteropelvic junction 11/21/2011   Hyperkalemia 03/14/1999   Personal history of pulmonary embolism 03/14/1999   Type 2 diabetes mellitus  with diabetic peripheral angiopathy without gangrene (HCC) 03/14/1999   Type 2 diabetes mellitus with diabetic chronic kidney disease (HCC) 03/14/1999   Atherosclerosis of aorta (HCC) 03/14/1999   Obesity, unspecified 03/14/1999   Long term (current) use of inhaled steroids 03/14/1999   Coronary artery disease involving native coronary artery of native heart without angina pectoris 03/14/1999   Other disorders of phosphorus metabolism 03/14/1999   Chronic pain syndrome 03/14/1999   Metabolic encephalopathy 03/14/1999   Long term (current) use of anticoagulants 03/14/1999   Hypomagnesemia 03/14/1999   Hypertensive chronic kidney disease w stg 1-4/unsp chr kdny 03/14/1999   Chronic kidney disease, stage 3b (HCC) 03/14/1999   Dvrtclos of intest, part unsp, w/o perf or abscess w/o bleed 03/14/1999   Emphysema, unspecified (HCC) 03/14/1999  Fatty (change of) liver, not elsewhere classified 03/14/1999   Other disorders of plasma-protein metabolism, not elsewhere classified 03/14/1999   Other chronic pain 03/14/1999   Long term (current) use of insulin (HCC) 03/14/1999    ONSET DATE: 12/25/2022 date of referral   REFERRING DIAG: R13.13 (ICD-10-CM) - Pharyngeal dysphagia   THERAPY DIAG:  Dysphagia, pharyngeal phase  Rationale for Evaluation and Treatment Rehabilitation  SUBJECTIVE:   SUBJECTIVE STATEMENT: Pt pleasant, appears to be good historian Pt accompanied by: significant other  PERTINENT HISTORY: Pt is an 77 year old female with past medical history of reflux (currently taking Protonix), type 2 diabetes, PAD, COPD.    PAIN:  Are you having pain? No  FALLS: Has patient fallen in last 6 months?  No  LIVING ENVIRONMENT: Lives with: lives with their spouse Lives in: House/apartment  PLOF:  Level of assistance: Needed assistance with ADLs, Needed assistance with IADLS Employment: Retired   PATIENT GOALS  to improve swallow function d/t globus sensation  OBJECTIVE:   COGNITION: Overall cognitive status: Within functional limits for tasks assessed  ORAL MOTOR EXAMINATION Facial : WFL Lingual: WFL Velum: WFL Mandible: WFL Cough: WFL Voice: WFL   CLINICAL SWALLOW ASSESSMENT:   Current diet: regular Dentition: adequate natural dentition Feeding: able to feed self Consistencies tested: Thin Liquid: Presentation: Cup and Straw Oral Phase: WFL Pharyngeal Phase: WFL Puree: Presentation: Cup and Straw Oral Phase: WFL Pharyngeal Phase: WFL Regular: Presentation: Cup and Straw Oral Phase: WFL Pharyngeal Phase: WFL   Evaluation findings: Patient presents with oropharyngeal swallow which appears clinically to be within functional limits with adequate airway protection. Oral stage is characterized by appearance of adequate oral containment, mastication, bolus formation, oral transfer and oral clearance. Swallow initiation appears timely. No overt signs of aspiration observed despite challenging with consecutive straw sips of thin liquids in excess of 3oz.  Aspiration risk factors:History of GERD and History of esophageal-related issues Overall aspiration risk:Mild Diet Recommendations: regular and thin liquids Precautions:Minimize environmental distractions, Slow rate, Small sips/bites, Seated upright 90 degrees, and Remain upright for at least 30 minutes after meals Supervision: Patient able to feed self Oral care recommendations:Oral care BID Follow-up recommendations: No treatment recommended     PATIENT REPORTED OUTCOME MEASURES (PROM): Reflux Symptom Index These are statements many people have used to describe their voices and the effects of their voices on their lives. In the last  month, how did the following problems affect you?   Hoarseness or a problem with your voice 4=Severe problem  Clearing your throat 1=Very mild problem  Excess throat mucous or postnasal drip 5=Problem as bad as it can be Difficulty swallowing food, liquids, or  pills 5=Problem as bad as it can be Coughing after you eat or after lying down 3+Moderate problem  Breathing difficulties or choking episodes 4=Severe problem  Troublesome or annoying cough 4=Severe problem  Sensation of something sticking in your throat or a lump in your throat 5=Problem as bad as it can be Heartburn, chest pain, indigestion, or stomach acid coming up 3+Moderate problem   Total: 34    Normative data suggests that an RSI of greater than or equal to 13 is clinically significant  Therefore, an RSI > 13 may be indicative of significant reflux disease.  PATIENT EDUCATION: Education details: results of this assessment, oropharyngeal vs esophageal dysphagia, reflux and aspiration precautions.  Person educated: Patient and Spouse Education method: Explanation Education comprehension: verbalized understanding     ASSESSMENT:  CLINICAL IMPRESSION: Patient is a  77 y.o. female who was seen today for a clinical swallow evaluation. At this time, she presents with adequate oropharyngeal dysphagia when consuming regular, puree and thin liquids. Pt reports regurgitation of food ("I start coughing until I brought it back up"), globus sensation with pills (pointing to base of her neck), increased hoarseness and post nasal drip, "I can't drink anything in bed because it will come back up." Given her report of globus sensation with pills, would recommend referral to GI. Education provided on reflux precautions.    PLAN: Recommend referral to GI for evaluation   Rexton Greulich B. Dreama Saa, M.S., CCC-SLP, Tree surgeon Certified Brain Injury Specialist [facility]  [facility] Rehabilitation Services Office [phone] Ascom [phone] Fax [phone]

## 2023-01-11 NOTE — Telephone Encounter (Signed)
Requested medications are due for refill today.  no  Requested medications are on the active medications list.  yes  Last refill. 12/07/2022 #30 1 rf  Future visit scheduled.   yes  Notes to clinic.  Abnormal labs.    Requested Prescriptions  Pending Prescriptions Disp Refills   SYNTHROID 100 MCG tablet [Pharmacy Med Name: SYNTHROID 100 MCG TAB] 30 tablet 1    Sig: TAKE ONE TABLET BY MOUTH EVERY DAY BEFORE BREAKFAST     Endocrinology:  Hypothyroid Agents Failed - 01/11/2023  3:52 PM      Failed - TSH in normal range and within 360 days    TSH  Date Value Ref Range Status  12/05/2022 0.048 (L) 0.450 - 4.500 uIU/mL Final         Passed - Valid encounter within last 12 months    Recent Outpatient Visits           1 week ago Acute cystitis without hematuria   [facility] [place], Megan P, DO   2 weeks ago Acute cystitis without hematuria   [facility] [place], Megan P, DO   1 month ago Type 2 diabetes mellitus with diabetic peripheral angiopathy without gangrene, without long-term current use of insulin (HCC)   [facility] [place], Megan P, DO   4 months ago Essential hypertension   [facility] [place], Megan P, DO   5 months ago Chronic midline back pain, unspecified back location   [facility] [place] [place], DO       Future Appointments             In 1 month Laural Benes, Oralia Rud, DO  [facility]

## 2023-01-17 ENCOUNTER — Other Ambulatory Visit: Payer: Medicare Other

## 2023-01-17 DIAGNOSIS — E039 Hypothyroidism, unspecified: Secondary | ICD-10-CM | POA: Diagnosis not present

## 2023-01-18 LAB — TSH: TSH: 0.123 u[IU]/mL — ABNORMAL LOW (ref 0.450–4.500)

## 2023-01-19 ENCOUNTER — Other Ambulatory Visit: Payer: Self-pay | Admitting: Family Medicine

## 2023-01-19 DIAGNOSIS — I959 Hypotension, unspecified: Secondary | ICD-10-CM

## 2023-01-19 DIAGNOSIS — E039 Hypothyroidism, unspecified: Secondary | ICD-10-CM

## 2023-01-19 MED ORDER — LEVOTHYROXINE SODIUM 75 MCG PO TABS: 75.0000 ug | ORAL_TABLET | Freq: Every day | ORAL | 0 refills | Status: DC

## 2023-01-22 ENCOUNTER — Other Ambulatory Visit: Payer: Self-pay

## 2023-01-22 ENCOUNTER — Other Ambulatory Visit: Payer: Self-pay | Admitting: *Deleted

## 2023-01-22 NOTE — Patient Instructions (Signed)
Visit Information  Thank you for taking time to visit with me today. Please don't hesitate to contact me if I can be of assistance to you before our next scheduled telephone appointment.  Following are the goals we discussed today:   Goals Addressed             This Visit's Progress    RNCM Care Management (DIABETES) EXPECTED OUTCOME: MONITOR, SELF-MANAGE AND REDUCE SYMPTOMS OF DIABETES       Current Barriers:  Knowledge Deficits related to how to effectively manage and maintain DM and goal of A1C <8% Chronic Disease Management support and education needs related to effective management DM Goal set by the provider <8.1% Lab Results  Component Value Date   HGBA1C 6.6 (H) 12/05/2022     Planned Interventions: Provided education to patient about basic DM disease process. Education provided. The patient has a stable A1C. Is at goal and is happy that she is at goal. She feels great and it has been a long time since she felt this good. Recently, the patient has been having some issues swallowing and was seen by speech therapy. RNCM suggested using applesauce to help aid in swallowing medications. Education and support provided. ; Reviewed medications with patient and discussed importance of medication adherence. The patient is compliant with medications.  Reviewed prescribed diet with patient heart healthy/ADA diet.  Counseled on importance of regular laboratory monitoring as prescribed. Labs are up to date.  Discussed plans with patient for ongoing care management follow up and provided patient with direct contact information for care management team;      Provided patient with written educational materials related to hypo and hyperglycemia and importance of correct treatment. Review of how to effectively manage drops in blood sugars or highs. The patient states the [facility] is really helpful and alerts her when her blood sugars is high or low. Continues to use the freestyle [place].   Reviewed scheduled/upcoming provider appointments including: Next pcp appointment on 03-05-2023  Advised patient, providing education and rationale, to check cbg when you have symptoms of low or high blood sugar and has freestyle libre  and record. States her blood sugars are really good. Her blood sugars  have been more stable since her procedure and denies any acute findings.  call provider for findings outside established parameters;       Review of patient status, including review of consultants reports, relevant laboratory and other test results, and medications completed;       Advised patient to discuss changes in DM and diabetes health with provider;      Screening for signs and symptoms of depression related to chronic disease state;        Assessed social determinant of health barriers;       Symptom Management: Take medications as prescribed   Attend all scheduled provider appointments Call pharmacy for medication refills 3-7 days in advance of running out of medications Call provider office for new concerns or questions  call the Suicide and Crisis Lifeline: 988 call the [place] National Suicide Prevention Lifeline: (646)814-7542 or TTY: 408-205-3335 TTY 609-492-7428) to talk to a trained counselor call 1-800-273-TALK (toll free, 24 hour hotline) if experiencing a Mental Health or Behavioral Health Crisis  check feet daily for cuts, sores or redness trim toenails straight across manage portion size wash and dry feet carefully every day wear comfortable, cotton socks wear comfortable, well-fitting shoes  Follow Up Plan: Telephone follow up appointment with care management team  member scheduled for: 02-26-2023 at 10:00 am       RNCM Care Management Expected Outcome:  Monitor, Self-Manage and Reduce Symptoms of CAD       Current Barriers:  Chronic Disease Management support and education needs related to effective management of CAD BP Readings from Last 3 Encounters:   01/01/23 112/72  12/25/22 96/62  12/05/22 103/65     Planned Interventions: Assessed understanding of CAD diagnosis. Her CAD is stable and Her blood pressures are good. Reflective listening and support given.  Medications reviewed including medications utilized in CAD treatment plan. Is compliant with medications. Denies any new concerns with medications.  Provided education on importance of blood pressure control in management of CAD. Her blood pressures are stable. She denies any acute findings with changes in her blood pressures. ; Provided education on Importance of limiting foods high in cholesterol. Has lost weight, eats healthy, does not eat between meals.; Counseled on importance of regular laboratory monitoring as prescribed. The patient has regular lab work.; Reviewed Importance of taking all medications as prescribed. Takes medications as prescribed.  Reviewed Importance of attending all scheduled provider appointments. Sees the pcp on a regular basis. Next pcp appointment on 03-05-2023  Advised to report any changes in symptoms or exercise tolerance Advised patient to discuss changes in CAD or heart health with provider; Screening for signs and symptoms of depression related to chronic disease state;  Assessed social determinant of health barriers;   Symptom Management: Take medications as prescribed   Attend all scheduled provider appointments Call provider office for new concerns or questions  call the Suicide and Crisis Lifeline: 988 call the [place] National Suicide Prevention Lifeline: 701-665-0595 or TTY: 503-710-9990 TTY 718-781-7705) to talk to a trained counselor call 1-800-273-TALK (toll free, 24 hour hotline) if experiencing a Mental Health or Behavioral Health Crisis   Follow Up Plan: Telephone follow up appointment with care management team member scheduled for: 02-26-2023 at 10:00 am           Our next appointment is by telephone on 02-26-2023 at 10:00  am  Please call the care guide team at [phone] if you need to cancel or reschedule your appointment.   If you are experiencing a Mental Health or Behavioral Health Crisis or need someone to talk to, please call the Suicide and Crisis Lifeline: 988 call the [place] National Suicide Prevention Lifeline: (416)497-2295 or TTY: 412-399-4309 TTY (212) 725-1495) to talk to a trained counselor call 1-800-273-TALK (toll free, 24 hour hotline) call 911   Patient verbalizes understanding of instructions and care plan provided today and agrees to view in MyChart. Active MyChart status and patient understanding of how to access instructions and care plan via MyChart confirmed with patient.     Telephone follow up appointment with care management team member scheduled for: 02-26-2023 at 10:00 am  Danise Edge, BSN RN RN Care Manager  [facility]  Ambulatory Care Management  Direct Number: [phone]

## 2023-01-22 NOTE — Patient Outreach (Signed)
Care Management   Visit Note  01/22/2023 Name: Tammy [name] MRN: [id number] DOB: 1945-11-24  Subjective: Tammy [name] is a 77 y.o. year old female who is a primary care patient of Dorcas Carrow, DO. The Care Management team was consulted for assistance.      Engaged with patient spoke with patient by telephone.    Goals Addressed             This Visit's Progress    RNCM Care Management (DIABETES) EXPECTED OUTCOME: MONITOR, SELF-MANAGE AND REDUCE SYMPTOMS OF DIABETES       Current Barriers:  Knowledge Deficits related to how to effectively manage and maintain DM and goal of A1C <8% Chronic Disease Management support and education needs related to effective management DM Goal set by the provider <8.1% Lab Results  Component Value Date   HGBA1C 6.6 (H) 12/05/2022     Planned Interventions: Provided education to patient about basic DM disease process. Education provided. The patient has a stable A1C. Is at goal and is happy that she is at goal. She feels great and it has been a long time since she felt this good. Recently, the patient has been having some issues swallowing and was seen by speech therapy. RNCM suggested using applesauce to help aid in swallowing medications. Education and support provided. ; Reviewed medications with patient and discussed importance of medication adherence. The patient is compliant with medications.  Reviewed prescribed diet with patient heart healthy/ADA diet.  Counseled on importance of regular laboratory monitoring as prescribed. Labs are up to date.  Discussed plans with patient for ongoing care management follow up and provided patient with direct contact information for care management team;      Provided patient with written educational materials related to hypo and hyperglycemia and importance of correct treatment. Review of how to effectively manage drops in blood sugars or highs. The patient states the [facility] is really helpful and  alerts her when her blood sugars is high or low. Continues to use the freestyle [place].  Reviewed scheduled/upcoming provider appointments including: Next pcp appointment on 03-05-2023  Advised patient, providing education and rationale, to check cbg when you have symptoms of low or high blood sugar and has freestyle libre  and record. States her blood sugars are really good. Her blood sugars  have been more stable since her procedure and denies any acute findings.  call provider for findings outside established parameters;       Review of patient status, including review of consultants reports, relevant laboratory and other test results, and medications completed;       Advised patient to discuss changes in DM and diabetes health with provider;      Screening for signs and symptoms of depression related to chronic disease state;        Assessed social determinant of health barriers;       Symptom Management: Take medications as prescribed   Attend all scheduled provider appointments Call pharmacy for medication refills 3-7 days in advance of running out of medications Call provider office for new concerns or questions  call the Suicide and Crisis Lifeline: 988 call the [place] National Suicide Prevention Lifeline: 778-231-6645 or TTY: 364 622 9389 TTY 705-602-7541) to talk to a trained counselor call 1-800-273-TALK (toll free, 24 hour hotline) if experiencing a Mental Health or Behavioral Health Crisis  check feet daily for cuts, sores or redness trim toenails straight across manage portion size wash and dry feet carefully every day  wear comfortable, cotton socks wear comfortable, well-fitting shoes  Follow Up Plan: Telephone follow up appointment with care management team member scheduled for: 02-26-2023 at 10:00 am       RNCM Care Management Expected Outcome:  Monitor, Self-Manage and Reduce Symptoms of CAD       Current Barriers:  Chronic Disease Management support and education  needs related to effective management of CAD BP Readings from Last 3 Encounters:  01/01/23 112/72  12/25/22 96/62  12/05/22 103/65     Planned Interventions: Assessed understanding of CAD diagnosis. Her CAD is stable and Her blood pressures are good. Reflective listening and support given.  Medications reviewed including medications utilized in CAD treatment plan. Is compliant with medications. Denies any new concerns with medications.  Provided education on importance of blood pressure control in management of CAD. Her blood pressures are stable. She denies any acute findings with changes in her blood pressures. ; Provided education on Importance of limiting foods high in cholesterol. Has lost weight, eats healthy, does not eat between meals.; Counseled on importance of regular laboratory monitoring as prescribed. The patient has regular lab work.; Reviewed Importance of taking all medications as prescribed. Takes medications as prescribed.  Reviewed Importance of attending all scheduled provider appointments. Sees the pcp on a regular basis. Next pcp appointment on 03-05-2023  Advised to report any changes in symptoms or exercise tolerance Advised patient to discuss changes in CAD or heart health with provider; Screening for signs and symptoms of depression related to chronic disease state;  Assessed social determinant of health barriers;   Symptom Management: Take medications as prescribed   Attend all scheduled provider appointments Call provider office for new concerns or questions  call the Suicide and Crisis Lifeline: 988 call the [place] National Suicide Prevention Lifeline: 559 575 2788 or TTY: 778 702 3432 TTY 613-243-0366) to talk to a trained counselor call 1-800-273-TALK (toll free, 24 hour hotline) if experiencing a Mental Health or Behavioral Health Crisis   Follow Up Plan: Telephone follow up appointment with care management team member scheduled for: 02-26-2023 at 10:00  am             Consent to Services:  Patient was given information about care management services, agreed to services, and gave verbal consent to participate.   Plan: Telephone follow up appointment with care management team member scheduled for: 02-26-2023 at 10:00 am  Danise Edge, BSN RN RN Care Manager  [facility]  Ambulatory Care Management  Direct Number: [phone]

## 2023-01-29 ENCOUNTER — Other Ambulatory Visit: Payer: Medicare Other

## 2023-01-29 ENCOUNTER — Encounter: Payer: Self-pay | Admitting: Family Medicine

## 2023-01-29 DIAGNOSIS — N261 Atrophy of kidney (terminal): Secondary | ICD-10-CM | POA: Diagnosis not present

## 2023-01-29 DIAGNOSIS — Q6211 Congenital occlusion of ureteropelvic junction: Secondary | ICD-10-CM | POA: Diagnosis not present

## 2023-01-29 DIAGNOSIS — N3020 Other chronic cystitis without hematuria: Secondary | ICD-10-CM | POA: Diagnosis not present

## 2023-01-31 DIAGNOSIS — E875 Hyperkalemia: Secondary | ICD-10-CM | POA: Diagnosis not present

## 2023-01-31 DIAGNOSIS — E1122 Type 2 diabetes mellitus with diabetic chronic kidney disease: Secondary | ICD-10-CM | POA: Diagnosis not present

## 2023-01-31 DIAGNOSIS — N1832 Chronic kidney disease, stage 3b: Secondary | ICD-10-CM | POA: Diagnosis not present

## 2023-01-31 DIAGNOSIS — I129 Hypertensive chronic kidney disease with stage 1 through stage 4 chronic kidney disease, or unspecified chronic kidney disease: Secondary | ICD-10-CM | POA: Diagnosis not present

## 2023-01-31 DIAGNOSIS — R809 Proteinuria, unspecified: Secondary | ICD-10-CM | POA: Diagnosis not present

## 2023-02-01 ENCOUNTER — Inpatient Hospital Stay: Payer: Medicare Other

## 2023-02-01 ENCOUNTER — Ambulatory Visit: Payer: Medicare Other | Admitting: Internal Medicine

## 2023-02-01 ENCOUNTER — Other Ambulatory Visit: Payer: Medicare Other

## 2023-02-01 ENCOUNTER — Inpatient Hospital Stay: Payer: Medicare Other | Admitting: Internal Medicine

## 2023-02-19 DIAGNOSIS — I129 Hypertensive chronic kidney disease with stage 1 through stage 4 chronic kidney disease, or unspecified chronic kidney disease: Secondary | ICD-10-CM | POA: Diagnosis not present

## 2023-02-19 DIAGNOSIS — R809 Proteinuria, unspecified: Secondary | ICD-10-CM | POA: Diagnosis not present

## 2023-02-19 DIAGNOSIS — E875 Hyperkalemia: Secondary | ICD-10-CM | POA: Diagnosis not present

## 2023-02-19 DIAGNOSIS — E1122 Type 2 diabetes mellitus with diabetic chronic kidney disease: Secondary | ICD-10-CM | POA: Diagnosis not present

## 2023-02-19 DIAGNOSIS — N1832 Chronic kidney disease, stage 3b: Secondary | ICD-10-CM | POA: Diagnosis not present

## 2023-02-21 ENCOUNTER — Other Ambulatory Visit: Payer: Self-pay

## 2023-02-23 ENCOUNTER — Other Ambulatory Visit: Payer: Self-pay | Admitting: Family Medicine

## 2023-02-23 NOTE — Telephone Encounter (Signed)
Request too soon. Last refilled 12/05/22 #540, 1RF Requested Prescriptions  Pending Prescriptions Disp Refills   gabapentin (NEURONTIN) 300 MG capsule [Pharmacy Med Name: GABAPENTIN 300 MG CAP] 540 capsule 1    Sig: TAKE 2 CAPULES BY MOUTH THREE TIMES DAILY     Neurology: Anticonvulsants - gabapentin Failed - 02/23/2023  4:46 PM      Failed - Cr in normal range and within 360 days    Creatinine  Date Value Ref Range Status  09/20/2011 1.31 (H) 0.60 - 1.30 mg/dL Final   Creatinine, Ser  Date Value Ref Range Status  10/23/2022 1.63 (H) 0.44 - 1.00 mg/dL Final         Passed - Completed PHQ-2 or PHQ-9 in the last 360 days      Passed - Valid encounter within last 12 months    Recent Outpatient Visits           1 month ago Acute cystitis without hematuria   [place] [facility] [place], Megan P, DO   2 months ago Acute cystitis without hematuria   [place] [facility], Megan P, DO   2 months ago Type 2 diabetes mellitus with diabetic peripheral angiopathy without gangrene, without long-term current use of insulin (HCC)   [place] [facility] [place], Megan P, DO   5 months ago Essential hypertension   [place] [facility] [place], Megan P, DO   6 months ago Chronic midline back pain, unspecified back location   [facility] Dorcas Carrow, DO       Future Appointments             In 3 weeks Laural Benes, Oralia Rud, DO [place] [facility], [facility]

## 2023-02-26 ENCOUNTER — Ambulatory Visit: Payer: Self-pay | Admitting: *Deleted

## 2023-02-26 NOTE — Patient Outreach (Signed)
  Care Coordination   02/26/2023 Name: Tammy [name] MRN: [id number] DOB: 04/01/1945   Care Coordination Outreach Attempts:  An unsuccessful telephone outreach was attempted for a scheduled appointment today.  Follow Up Plan:  Additional outreach attempts will be made to offer the patient care coordination information and services.   Encounter Outcome:  No Answer   Care Coordination Interventions:  No, not indicated    Rodney Langton, RN, MSN, CCM   [facility], [facility] RN Care Coordinator Direct Dial: [phone] / Main [phone] Fax [phone] Email: [email or web address] Website: [email or web address]

## 2023-03-01 ENCOUNTER — Other Ambulatory Visit: Payer: Self-pay | Admitting: Family Medicine

## 2023-03-01 NOTE — Telephone Encounter (Signed)
Requested Prescriptions  Pending Prescriptions Disp Refills   glipiZIDE (GLUCOTROL) 5 MG tablet [Pharmacy Med Name: GLIPIZIDE 5 MG TAB] 90 tablet 1    Sig: TAKE 1 TABLET BY MOUTH ONCE DAILY BEFOREBREAKFAST     Endocrinology:  Diabetes - Sulfonylureas Failed - 03/01/2023 11:14 AM      Failed - Cr in normal range and within 360 days    Creatinine  Date Value Ref Range Status  09/20/2011 1.31 (H) 0.60 - 1.30 mg/dL Final   Creatinine, Ser  Date Value Ref Range Status  10/23/2022 1.63 (H) 0.44 - 1.00 mg/dL Final         Passed - HBA1C is between 0 and 7.9 and within 180 days    Hemoglobin A1C  Date Value Ref Range Status  11/23/2021 7.5  Final   HB A1C (BAYER DCA - WAIVED)  Date Value Ref Range Status  06/20/2022 6.5 (H) 4.8 - 5.6 % Final    Comment:             Prediabetes: 5.7 - 6.4          Diabetes: >6.4          Glycemic control for adults with diabetes: <7.0    Hgb A1c MFr Bld  Date Value Ref Range Status  12/05/2022 6.6 (H) 4.8 - 5.6 % Final    Comment:             Prediabetes: 5.7 - 6.4          Diabetes: >6.4          Glycemic control for adults with diabetes: <7.0          Passed - Valid encounter within last 6 months    Recent Outpatient Visits           1 month ago Acute cystitis without hematuria   [place] [facility] [place], Megan P, DO   2 months ago Acute cystitis without hematuria   [place] [facility] [place], Megan P, DO   2 months ago Type 2 diabetes mellitus with diabetic peripheral angiopathy without gangrene, without long-term current use of insulin (HCC)   [facility] [place], Megan P, DO   5 months ago Essential hypertension   [place] [facility] [place], Megan P, DO   6 months ago Chronic midline back pain, unspecified back location   [facility] Dorcas Carrow, DO       Future Appointments             In 2 weeks Laural Benes, Megan P, DO  [place] [facility]             trimethoprim (TRIMPEX) 100 MG tablet [Pharmacy Med Name: TRIMETHOPRIM 100 MG TAB] 90 tablet 0    Sig: TAKE ONE TABLET BY MOUTH EVERY DAY     Off-Protocol Failed - 03/01/2023 11:14 AM      Failed - Medication not assigned to a protocol, review manually.      Passed - Valid encounter within last 12 months    Recent Outpatient Visits           1 month ago Acute cystitis without hematuria   [place] [facility] [place], [place] P, DO   2 months ago Acute cystitis without hematuria    [facility] [place], [place] P, DO   2 months ago Type 2 diabetes mellitus with diabetic peripheral angiopathy  without gangrene, without long-term current use of insulin (HCC)   [place] [facility] [place], Megan P, DO   5 months ago Essential hypertension   [place] [facility] [place], Megan P, DO   6 months ago Chronic midline back pain, unspecified back location   [place] Dorcas Carrow, DO       Future Appointments             In 2 weeks Laural Benes, Oralia Rud, DO [place] [facility], [facility]            Refused Prescriptions Disp Refills   SYNTHROID 100 MCG tablet [Pharmacy Med Name: SYNTHROID 100 MCG TAB] 30 tablet 1    Sig: TAKE ONE TABLET BY MOUTH EVERY DAY BEFORE BREAKFAST     Endocrinology:  Hypothyroid Agents Failed - 03/01/2023 11:14 AM      Failed - TSH in normal range and within 360 days    TSH  Date Value Ref Range Status  01/17/2023 0.123 (L) 0.450 - 4.500 uIU/mL Final         Passed - Valid encounter within last 12 months    Recent Outpatient Visits           1 month ago Acute cystitis without hematuria   [place] [facility] [place], [place] P, DO   2 months ago Acute cystitis without hematuria   [place] [facility] [place], Megan P, DO   2 months ago Type 2 diabetes  mellitus with diabetic peripheral angiopathy without gangrene, without long-term current use of insulin (HCC)   [place] [facility] [place], Megan P, DO   5 months ago Essential hypertension   [place] [facility] [place], Megan P, DO   6 months ago Chronic midline back pain, unspecified back location   [facility] Dorcas Carrow, DO       Future Appointments             In 2 weeks Laural Benes, Oralia Rud, DO [place] [facility], [facility]

## 2023-03-01 NOTE — Telephone Encounter (Signed)
Requested medications are due for refill today.  yes  Requested medications are on the active medications list.  yes  Last refill. 12/05/2022 #90 0 rf  Future visit scheduled.   yes  Notes to clinic.  Medication not assigned to a protocol. Please review for refill.    Requested Prescriptions  Pending Prescriptions Disp Refills   trimethoprim (TRIMPEX) 100 MG tablet [Pharmacy Med Name: TRIMETHOPRIM 100 MG TAB] 90 tablet 0    Sig: TAKE ONE TABLET BY MOUTH EVERY DAY     Off-Protocol Failed - 03/01/2023 11:15 AM      Failed - Medication not assigned to a protocol, review manually.      Passed - Valid encounter within last 12 months    Recent Outpatient Visits           1 month ago Acute cystitis without hematuria   [place] [facility] [place], [place] P, DO   2 months ago Acute cystitis without hematuria   [place] [facility] [place], [place] P, DO   2 months ago Type 2 diabetes mellitus with diabetic peripheral angiopathy without gangrene, without long-term current use of insulin (HCC)   [place] [facility], [facility] P, DO   5 months ago Essential hypertension   [place] [facility] [place], Megan P, DO   6 months ago Chronic midline back pain, unspecified back location   [place] [facility] Dorcas Carrow, DO       Future Appointments             In 2 weeks Laural Benes, Oralia Rud, DO [place] [facility], [facility]            Signed Prescriptions Disp Refills   glipiZIDE (GLUCOTROL) 5 MG tablet 90 tablet 1    Sig: TAKE 1 TABLET BY MOUTH ONCE DAILY BEFOREBREAKFAST     Endocrinology:  Diabetes - Sulfonylureas Failed - 03/01/2023 11:15 AM      Failed - Cr in normal range and within 360 days    Creatinine  Date Value Ref Range Status  09/20/2011 1.31 (H) 0.60 - 1.30 mg/dL Final   Creatinine, Ser  Date Value Ref Range Status  10/23/2022 1.63 (H) 0.44 - 1.00 mg/dL Final          Passed - HBA1C is between 0 and 7.9 and within 180 days    Hemoglobin A1C  Date Value Ref Range Status  11/23/2021 7.5  Final   HB A1C (BAYER DCA - WAIVED)  Date Value Ref Range Status  06/20/2022 6.5 (H) 4.8 - 5.6 % Final    Comment:             Prediabetes: 5.7 - 6.4          Diabetes: >6.4          Glycemic control for adults with diabetes: <7.0    Hgb A1c MFr Bld  Date Value Ref Range Status  12/05/2022 6.6 (H) 4.8 - 5.6 % Final    Comment:             Prediabetes: 5.7 - 6.4          Diabetes: >6.4          Glycemic control for adults with diabetes: <7.0          Passed - Valid encounter within last 6 months    Recent Outpatient Visits           1  month ago Acute cystitis without hematuria   [place] [facility] [place], [place] P, DO   2 months ago Acute cystitis without hematuria   [place] [facility] [place], [place] P, DO   2 months ago Type 2 diabetes mellitus with diabetic peripheral angiopathy without gangrene, without long-term current use of insulin (HCC)   [place], [facility] P, DO   5 months ago Essential hypertension   [place] [facility] [place], Megan P, DO   6 months ago Chronic midline back pain, unspecified back location   [facility] Dorcas Carrow, DO       Future Appointments             In 2 weeks Laural Benes, Oralia Rud, DO [place] [facility], [facility]            Refused Prescriptions Disp Refills   SYNTHROID 100 MCG tablet [Pharmacy Med Name: SYNTHROID 100 MCG TAB] 30 tablet 1    Sig: TAKE ONE TABLET BY MOUTH EVERY DAY BEFORE BREAKFAST     Endocrinology:  Hypothyroid Agents Failed - 03/01/2023 11:15 AM      Failed - TSH in normal range and within 360 days    TSH  Date Value Ref Range Status  01/17/2023 0.123 (L) 0.450 - 4.500 uIU/mL Final         Passed - Valid encounter within last 12 months    Recent  Outpatient Visits           1 month ago Acute cystitis without hematuria   [place] [facility] [place], [place] P, DO   2 months ago Acute cystitis without hematuria   [facility] [place], Megan P, DO   2 months ago Type 2 diabetes mellitus with diabetic peripheral angiopathy without gangrene, without long-term current use of insulin (HCC)   [place] [facility] [place], Megan P, DO   5 months ago Essential hypertension   [place] [facility] [place], Megan P, DO   6 months ago Chronic midline back pain, unspecified back location   [facility] Dorcas Carrow, DO       Future Appointments             In 2 weeks Laural Benes, Oralia Rud, DO  [place] [facility], [facility]

## 2023-03-02 ENCOUNTER — Other Ambulatory Visit: Payer: Medicare Other

## 2023-03-05 ENCOUNTER — Other Ambulatory Visit: Payer: Self-pay | Admitting: Family Medicine

## 2023-03-05 ENCOUNTER — Ambulatory Visit: Payer: Medicare Other | Admitting: Family Medicine

## 2023-03-05 DIAGNOSIS — E1151 Type 2 diabetes mellitus with diabetic peripheral angiopathy without gangrene: Secondary | ICD-10-CM

## 2023-03-05 NOTE — Telephone Encounter (Signed)
Medication Refill -  Most Recent Primary Care Visit:  Provider: [facility]  Department[facility]  Visit Type: LAB  Date: 01/17/2023  Medication: Continuous Glucose Sensor (FREESTYLE LIBRE 2 SENSOR) MISC Pt in need of more sensors, pt only has 12 hours left on current sensor.  Has the patient contacted their pharmacy? No Pt unsure who to contact, decided to contact office.  Is this the correct pharmacy for this prescription? Yes This is the patient's preferred pharmacy:  [facility] - [place], [place] - [address] Reesa Chew [place] [address] Phone: [phone] Fax: [phone]  Has the prescription been filled recently? Yes  Is the patient out of the medication? Yes, pt only has 12 hours left on the sensor.   Has the patient been seen for an appointment in the last year OR does the patient have an upcoming appointment? Yes  Can we respond through MyChart? Yes  Agent: Please be advised that Rx refills may take up to 3 business days. We ask that you follow-up with your pharmacy.

## 2023-03-06 ENCOUNTER — Other Ambulatory Visit (INDEPENDENT_AMBULATORY_CARE_PROVIDER_SITE_OTHER): Payer: Self-pay | Admitting: Nurse Practitioner

## 2023-03-06 DIAGNOSIS — Z9889 Other specified postprocedural states: Secondary | ICD-10-CM

## 2023-03-06 NOTE — Telephone Encounter (Signed)
Refill requested too soon. Last reordered 08/28/22 #26, 3RF Requested Prescriptions  Pending Prescriptions Disp Refills   Continuous Glucose Sensor (FREESTYLE LIBRE 2 SENSOR) MISC 26 each 3    Sig: 1 each by Does not apply route every 14 (fourteen) days.     Endocrinology: Diabetes - Testing Supplies Passed - 03/06/2023 12:27 PM      Passed - Valid encounter within last 12 months    Recent Outpatient Visits           2 months ago Acute cystitis without hematuria   [place] [facility] [place], [place] P, DO   2 months ago Acute cystitis without hematuria   [place] [facility] [place], [place] P, DO   3 months ago Type 2 diabetes mellitus with diabetic peripheral angiopathy without gangrene, without long-term current use of insulin (HCC)   [place] [facility], Megan P, DO   6 months ago Essential hypertension    [facility] [place], Megan P, DO   7 months ago Chronic midline back pain, unspecified back location   [facility] Dorcas Carrow, DO       Future Appointments             In 1 week Laural Benes, Oralia Rud, DO  [place], [facility]

## 2023-03-06 NOTE — Telephone Encounter (Signed)
Last refill 12/05/22 #540 1 RF  Requested Prescriptions  Refused Prescriptions Disp Refills   gabapentin (NEURONTIN) 300 MG capsule [Pharmacy Med Name: GABAPENTIN 300 MG CAP] 540 capsule 1    Sig: TAKE 2 CAPULES BY MOUTH THREE TIMES DAILY     Neurology: Anticonvulsants - gabapentin Failed - 03/06/2023 12:07 PM      Failed - Cr in normal range and within 360 days    Creatinine  Date Value Ref Range Status  09/20/2011 1.31 (H) 0.60 - 1.30 mg/dL Final   Creatinine, Ser  Date Value Ref Range Status  10/23/2022 1.63 (H) 0.44 - 1.00 mg/dL Final         Passed - Completed PHQ-2 or PHQ-9 in the last 360 days      Passed - Valid encounter within last 12 months    Recent Outpatient Visits           2 months ago Acute cystitis without hematuria   [place] [facility] [place], Megan P, DO   2 months ago Acute cystitis without hematuria   [place] [facility] [place], Megan P, DO   3 months ago Type 2 diabetes mellitus with diabetic peripheral angiopathy without gangrene, without long-term current use of insulin (HCC)   [place] [facility] [place], Megan P, DO   6 months ago Essential hypertension   [place] [facility] [place], Megan P, DO   7 months ago Chronic midline back pain, unspecified back location   [facility] Dorcas Carrow, DO       Future Appointments             In 1 week Laural Benes, Oralia Rud, DO [place] [facility], [facility]

## 2023-03-08 ENCOUNTER — Ambulatory Visit: Payer: Medicare Other | Admitting: Family Medicine

## 2023-03-09 ENCOUNTER — Other Ambulatory Visit: Payer: Self-pay | Admitting: Family Medicine

## 2023-03-09 ENCOUNTER — Ambulatory Visit (INDEPENDENT_AMBULATORY_CARE_PROVIDER_SITE_OTHER): Payer: Medicare Other | Admitting: Nurse Practitioner

## 2023-03-09 ENCOUNTER — Encounter (INDEPENDENT_AMBULATORY_CARE_PROVIDER_SITE_OTHER): Payer: Medicare Other

## 2023-03-13 NOTE — Telephone Encounter (Signed)
 Requested Prescriptions  Pending Prescriptions Disp Refills   pantoprazole  (PROTONIX ) 40 MG tablet [Pharmacy Med Name: PANTOPRAZOLE  SODIUM 40 MG DR TAB] 90 tablet 1    Sig: TAKE 1 TABLET BY MOUTH ONCE DAILY     Gastroenterology: Proton Pump Inhibitors Passed - 03/13/2023  5:58 PM      Passed - Valid encounter within last 12 months    Recent Outpatient Visits           2 months ago Acute cystitis without hematuria   [place] [facility] [place], Megan P, DO   2 months ago Acute cystitis without hematuria   [place] [facility] [place], Megan P, DO   3 months ago Type 2 diabetes mellitus with diabetic peripheral angiopathy without gangrene, without long-term current use of insulin  [facility])   [place] [facility] [place], Megan P, DO   6 months ago Essential hypertension   [place] [facility] [place], Megan P, DO   7 months ago Chronic midline back pain, unspecified back location   [facility] Vicci Duwaine SQUIBB, DO       Future Appointments             In 6 days Vicci Duwaine SQUIBB, DO [place] [facility]

## 2023-03-16 ENCOUNTER — Other Ambulatory Visit: Payer: Medicare Other

## 2023-03-19 ENCOUNTER — Ambulatory Visit: Payer: Medicare Other | Admitting: Family Medicine

## 2023-03-21 ENCOUNTER — Other Ambulatory Visit: Payer: Self-pay | Admitting: Family Medicine

## 2023-03-26 ENCOUNTER — Ambulatory Visit: Payer: Medicare Other | Admitting: Internal Medicine

## 2023-03-26 ENCOUNTER — Other Ambulatory Visit: Payer: Medicare Other

## 2023-03-26 DIAGNOSIS — I129 Hypertensive chronic kidney disease with stage 1 through stage 4 chronic kidney disease, or unspecified chronic kidney disease: Secondary | ICD-10-CM | POA: Diagnosis not present

## 2023-03-26 DIAGNOSIS — E875 Hyperkalemia: Secondary | ICD-10-CM | POA: Diagnosis not present

## 2023-03-26 DIAGNOSIS — E1122 Type 2 diabetes mellitus with diabetic chronic kidney disease: Secondary | ICD-10-CM | POA: Diagnosis not present

## 2023-03-26 DIAGNOSIS — N1832 Chronic kidney disease, stage 3b: Secondary | ICD-10-CM | POA: Diagnosis not present

## 2023-03-26 DIAGNOSIS — R809 Proteinuria, unspecified: Secondary | ICD-10-CM | POA: Diagnosis not present

## 2023-03-26 NOTE — Telephone Encounter (Signed)
 Requested Prescriptions  Pending Prescriptions Disp Refills   metoprolol  tartrate (LOPRESSOR ) 50 MG tablet [Pharmacy Med Name: METOPROLOL  TARTRATE 50 MG TAB] 60 tablet 2    Sig: TAKE ONE (1) TABLET BY MOUTH TWO TIMES PER DAY     Cardiovascular:  Beta Blockers Passed - 03/26/2023  7:36 AM      Passed - Last BP in normal range    BP Readings from Last 1 Encounters:  01/01/23 112/72         Passed - Last Heart Rate in normal range    Pulse Readings from Last 1 Encounters:  01/01/23 71         Passed - Valid encounter within last 6 months    Recent Outpatient Visits           2 months ago Acute cystitis without hematuria   [place] [facility] [place], Megan P, DO   3 months ago Acute cystitis without hematuria   [place] [facility] [place], Megan P, DO   3 months ago Type 2 diabetes mellitus with diabetic peripheral angiopathy without gangrene, without long-term current use of insulin  [facility])   [place] [facility] [place], Megan P, DO   6 months ago Essential hypertension   [place] [facility], Megan P, DO   7 months ago Chronic midline back pain, unspecified back location   [facility] Vicci Duwaine SQUIBB, DO       Future Appointments             In 1 week Wittenborn, Barnie, NP [facility] [address]   In 2 weeks Vicci, Duwaine SQUIBB, DO  [facility], [facility]

## 2023-03-29 ENCOUNTER — Other Ambulatory Visit: Payer: Self-pay

## 2023-03-29 ENCOUNTER — Other Ambulatory Visit: Payer: Self-pay | Admitting: Family Medicine

## 2023-03-29 MED ORDER — MOUNJARO 12.5 MG/0.5ML ~~LOC~~ SOAJ
12.5000 mg | SUBCUTANEOUS | 1 refills | Status: DC
  Filled 2023-03-29: qty 2, 28d supply, fill #0
  Filled 2023-04-16: qty 6, 84d supply, fill #0
  Filled 2023-07-11: qty 6, 84d supply, fill #1

## 2023-03-29 NOTE — Telephone Encounter (Signed)
Requested medication (s) are due for refill today: routing for review  Requested medication (s) are on the active medication list: yes  Last refill:  09/04/22  Future visit scheduled: yes  Notes to clinic:   Medication not assigned to a protocol, review manually.      Requested Prescriptions  Pending Prescriptions Disp Refills   tirzepatide (MOUNJARO) 12.5 MG/0.5ML Pen 6 mL 1    Sig: Inject 12.5 mg into the skin once a week.     Off-Protocol Failed - 03/29/2023  3:27 PM      Failed - Medication not assigned to a protocol, review manually.      Passed - Valid encounter within last 12 months    Recent Outpatient Visits           2 months ago Acute cystitis without hematuria   [place] [facility] [place], [place] P, DO   3 months ago Acute cystitis without hematuria   [place] [facility] [place], [place] P, DO   3 months ago Type 2 diabetes mellitus with diabetic peripheral angiopathy without gangrene, without long-term current use of insulin (HCC)   [place] [facility] [place], Megan P, DO   6 months ago Essential hypertension   [place] [facility] [place], Megan P, DO   7 months ago Chronic midline back pain, unspecified back location   [facility] Dorcas Carrow, DO       Future Appointments             In 5 days Wittenborn, Gavin Pound, NP [place] [facility] [place]   In 1 week Dorcas Carrow, DO [place] [facility], [facility]

## 2023-04-02 ENCOUNTER — Telehealth: Payer: Self-pay

## 2023-04-02 NOTE — Telephone Encounter (Signed)
Spoke with Appeals Department representative Lowella Bandy to follow up on denial for patient's prescription of Mounjaro 12.5 MG. Requested expedited has been initiated via telephone and the turn around time is 72 hours.   [id number]

## 2023-04-02 NOTE — Telephone Encounter (Signed)
Clinical Notes were attached and faxed over to [facility] Department to fax # [phone]. Direct contact information is [phone].

## 2023-04-02 NOTE — Progress Notes (Unsigned)
Cardiology Clinic Note   Date: 04/03/2023 ID: Tammy [name], Tammy [name] 1946/01/22, MRN [id number]  Primary Cardiologist:  Julien Nordmann, MD  Patient Profile    Tammy [name] is a 78 y.o. female who presents to the clinic today for routine follow up.     Past medical history significant for: CAD. LHC 01/04/2011 (angina): Proximal LAD 30%.  Mid LAD 40%.  Mid LCx 40%.  Proximal RCA 80%.  Mid RCA 100% with good collaterals from distal LAD.  Recommend medical management. LHC 12/07/2011 (angina): Mid LAD 50%.  Distal LAD 50%.  Proximal RCA 70%.  Mid RCA known CTO. LHC 04/04/2019 (angina): Proximal to mid RCA 100% with left-to-right collaterals.  Mid LAD 45%.  Stable disease when compared to September 2013. R/LHC 09/26/2021 (exertional dyspnea): Mid LAD 45%.  Proximal to mid RCA 100% with well-developed left-to-right collaterals.  Proximal RCA 90%.  No significant change from January 2021.  Recommend continuing aggressive medical therapy.  Right heart catheterization showed mildly elevated filling pressures, mild pulmonary hypertension and normal cardiac output. Echo 03/10/2022: EF 55 to 60%.  No RWMA.  Normal diastolic parameters.  Normal RV size/function.  No significant valvular abnormalities. PSVT. 14-day ZIO 12/29/2022: Heart rate 65 to 160 bpm, average 82 bpm.  561 nonsustained runs of SVT longest 19 beats.  Rare ectopy.  No sustained arrhythmias.  No A-fib. PAD. Aortogram and lower extremity angiogram 10/23/2022: PTA right tibioperoneal trunk and most proximal posterior tibial artery.  PTA right SFA and popliteal arteries.  Stent placement right SFA and most proximal popliteal artery. ABI 11/29/2022: Right ABI/TBI within normal range.  Mild left lower extremity arterial disease. Abdominal aortic atherosclerosis. Carotid artery disease. Carotid duplex 08/13/2014: Bilateral ICA 1 to 39%. Hypertension. Hyperlipidemia. Lipid panel 09/04/2022: LDL 55, HDL 43, TG 180, total  128. PE/DVT. COPD. T2DM. Hypothyroidism.   CKD stage IIIb.  In summary, patient has a history of CAD with CTO of RCA dating back to October 2012 as detailed above.  She had repeat catheterization in September 2013 which showed stable coronary disease. Echo at that time showed normal LV function, mild inferior hypokinesis suggestive of CAD, mild LVH, mild diastolic dysfunction, normal PA pressure.  In January 2021 she complained of chest pain worsened with exertion and relieved with NTG.  She underwent LHC which showed stable disease when compared to catheterization in September 2013.  In July 2023 patient complained of most severe episode of chest pain resolved with several NTG.  She underwent heart catheterization which was unchanged from prior catheterization.  Aggressive medical therapy was recommended.  Echo December 2023 showed normal LV/RV function as detailed above.  Patient underwent lower extremity intervention with [facility] in August 2024 as detailed above.     History of Present Illness    Tammy [name] is followed by Dr. Mariah Milling for the above outlined history.   Patient was last seen in the office by Dr. Mariah Milling on 05/31/2022 for routine follow-up.  She complained of chest pain attributed to rib fractures.  No further testing was indicated.  Today, patient is accompanied by her son. Patient reports she is doing well. She will have occasional chest discomfort that is brief and resolves on its own. She feels her fractured ribs have not full healed. She does not have chest pain when doing activities around her home. She will have occasional palpitations that typically occur in the morning and last just a few minutes before resolving. She reports right greater than left lower extremity pain  particularly in her knee. She is scheduled for a follow up with ortho. She has noticed her BP has been trending down. Her PCP mentioned reducing metoprolol if she continued to have low BP. She denies lightheadedness  or dizziness.      ROS: All other systems reviewed and are otherwise negative except as noted in History of Present Illness.  EKGs/Labs Reviewed    EKG Interpretation Date/Time:  Tuesday April 03 2023 14:01:51 EST Ventricular Rate:  62 PR Interval:  150 QRS Duration:  94 QT Interval:  418 QTC Calculation: 424 R Axis:   -54  Text Interpretation: Normal sinus rhythm Left anterior fascicular block Left axis deviation Moderate voltage criteria for LVH, may be normal variant ( R in aVL , Cornell product ) Anterior infarct , age undetermined When compared with ECG of 09-Mar-2022 13:48, No significant change Confirmed by Carlos Levering [phone]) on 04/03/2023 2:16:40 PM   09/04/2022: ALT 17; AST 20; Potassium 5.1; Sodium 139 10/23/2022: BUN 52; Creatinine, Ser 1.63   09/04/2022: Hemoglobin 14.0; WBC 9.7   01/17/2023: TSH 0.123           Physical Exam    VS:  BP 100/60 (BP Location: Left Arm, Patient Position: Sitting, Cuff Size: Large)   Pulse 62   Ht 5\' 6"  (1.676 m)   Wt 179 lb 8 oz (81.4 kg)   SpO2 97%   BMI 28.97 kg/m  , BMI Body mass index is 28.97 kg/m.  GEN: Well nourished, well developed, in no acute distress. Neck: No JVD or carotid bruits. Cardiac:  RRR. No murmurs. No rubs or gallops.   Respiratory:  Respirations regular and unlabored. Clear to auscultation without rales, wheezing or rhonchi. GI: Soft, nontender, nondistended. Extremities: Radials 2+ and equal. DP/PT Not palpable. No clubbing or cyanosis. No edema.  Skin: Warm and dry, no rash. Neuro: Strength intact.  Assessment & Plan   CAD LHC x 4 (2012, 2013, 2021, 2023) showing mild to moderate stenosis in LAD and CTO of RCA.  Patient reports brief, occasional chest discomfort. She feels her rib fractures did not heal properly. She is not tender to palpation. She does not have any chest pain when performing light household activities or ADLs. No further testing indicated at this time.  -Continue  rosuvastatin, metoprolol, isosorbide, as needed SL NTG.  Patient not on aspirin secondary to Eliquis.  Palpitations/PSVT 14-day ZIO October 2024 ordered by PCP showed 561 runs of PSVT.  Patient reports occasional, brief palpitations typically in the morning that resolves on its own. EKG today shows NSR 62 bpm.  -Continue metoprolol.  PAD S/p PTA and stent placement right lower extremity August 2024.  Patient reports R>L lower extremity pain mainly in her knee. She has a follow up soon with ortho. No edema today.  -Continue rosuvastatin and Eliquis. -Followed by [facility]VS.  Hypertension BP today 100/70. Patient reports BP has been trending down. She denies lightheadedness or dizziness.  -Continue metoprolol. -Decrease isosorbide to 30 mg daily.   Hyperlipidemia LDL June 2024 55, at goal. -Continue rosuvastatin.  History of PE/DVT No report of shortness of breath or DOE. Denies spontaneous bleeding concerns. -Continue Eliquis. -Anticoagulation managed by PCP.  Disposition: Return in 6 months or sooner as needed.          Signed, Etta Grandchild. Kato Wieczorek, DNP, NP-C

## 2023-04-03 ENCOUNTER — Telehealth: Payer: Self-pay | Admitting: Family Medicine

## 2023-04-03 ENCOUNTER — Ambulatory Visit: Payer: Medicare Other | Attending: Student | Admitting: Student

## 2023-04-03 ENCOUNTER — Encounter: Payer: Self-pay | Admitting: Student

## 2023-04-03 VITALS — BP 100/60 | HR 62 | Ht 66.0 in | Wt 179.5 lb

## 2023-04-03 DIAGNOSIS — I739 Peripheral vascular disease, unspecified: Secondary | ICD-10-CM | POA: Insufficient documentation

## 2023-04-03 DIAGNOSIS — I10 Essential (primary) hypertension: Secondary | ICD-10-CM | POA: Insufficient documentation

## 2023-04-03 DIAGNOSIS — I25118 Atherosclerotic heart disease of native coronary artery with other forms of angina pectoris: Secondary | ICD-10-CM | POA: Insufficient documentation

## 2023-04-03 DIAGNOSIS — E782 Mixed hyperlipidemia: Secondary | ICD-10-CM | POA: Diagnosis not present

## 2023-04-03 DIAGNOSIS — I2699 Other pulmonary embolism without acute cor pulmonale: Secondary | ICD-10-CM | POA: Diagnosis not present

## 2023-04-03 DIAGNOSIS — I471 Supraventricular tachycardia, unspecified: Secondary | ICD-10-CM | POA: Insufficient documentation

## 2023-04-03 DIAGNOSIS — R002 Palpitations: Secondary | ICD-10-CM | POA: Diagnosis not present

## 2023-04-03 MED ORDER — ISOSORBIDE MONONITRATE ER 30 MG PO TB24: 30.0000 mg | ORAL_TABLET | Freq: Every day | ORAL | 3 refills | Status: DC

## 2023-04-03 NOTE — Telephone Encounter (Signed)
Spoke with patient to inform that the prior authorization appeal was initiated yesterday afternoon. Also, informed the patient the turn around time for the decision can take up to 72 hours. Once determination has been received, someone from the clinical staff will give her a call. Patient verbalized understanding.

## 2023-04-03 NOTE — Patient Instructions (Signed)
Medication Instructions:  Your physician has recommended you make the following change in your medication:   DECREASE Isosorbide mononitrate to 30 mg once daily at bedtime.   *If you need a refill on your cardiac medications before your next appointment, please call your pharmacy*   Lab Work: None  If you have labs (blood work) drawn today and your tests are completely normal, you will receive your results only by: MyChart Message (if you have MyChart) OR A paper copy in the mail If you have any lab test that is abnormal or we need to change your treatment, we will call you to review the results.   Testing/Procedures: None   Follow-Up: At [facility], you and your health needs are our priority.  As part of our continuing mission to provide you with exceptional heart care, we have created designated Provider Care Teams.  These Care Teams include your primary Cardiologist (physician) and Advanced Practice Providers (APPs -  Physician Assistants and Nurse Practitioners) who all work together to provide you with the care you need, when you need it.   Your next appointment:   6 month(s)  Provider:   Julien Nordmann, MD or Carlos Levering, NP

## 2023-04-03 NOTE — Telephone Encounter (Signed)
Copied from CRM [id number]. Topic: General - Other >> Apr 03, 2023 10:44 AM Macon Large wrote: Reason for CRM: Pt stated that she received a call informing her that her insurance will not cover the tirzepatide [facility]) 12.5 MG/0.5ML Pen because they did not receive enough information as to the reason pt is being prescribed this medication. Cb# [phone]

## 2023-04-07 ENCOUNTER — Other Ambulatory Visit: Payer: Self-pay | Admitting: Family Medicine

## 2023-04-09 NOTE — Telephone Encounter (Signed)
Requested medications are due for refill today.  yes  Requested medications are on the active medications list.  yes  Last refill. 09/04/2022 #180 1 rf  Future visit scheduled.   yes  Notes to clinic.  Abnormal labs    Requested Prescriptions  Pending Prescriptions Disp Refills   DULoxetine (CYMBALTA) 60 MG capsule [Pharmacy Med Name: DULOXETINE HCL 60 MG CAP] 180 capsule 1    Sig: TAKE 2 CAPSULES BY MOUTH ONCE DAILY     Psychiatry: Antidepressants - SNRI - duloxetine Failed - 04/09/2023  5:44 PM      Failed - Cr in normal range and within 360 days    Creatinine  Date Value Ref Range Status  09/20/2011 1.31 (H) 0.60 - 1.30 mg/dL Final   Creatinine, Ser  Date Value Ref Range Status  10/23/2022 1.63 (H) 0.44 - 1.00 mg/dL Final         Passed - eGFR is 30 or above and within 360 days    EGFR (African American)  Date Value Ref Range Status  09/20/2011 48 (L)  Final   GFR calc Af Amer  Date Value Ref Range Status  02/10/2020 32 (L) >59 mL/min/1.73 Final    Comment:    **In accordance with recommendations from the NKF-ASN Task force,**   [facility] is in the process of updating its eGFR calculation to the   2021 CKD-EPI creatinine equation that estimates kidney function   without a race variable.    EGFR (Non-African Amer.)  Date Value Ref Range Status  09/20/2011 42 (L)  Final    Comment:    eGFR values <69mL/min/1.73 m2 may be an indication of chronic kidney disease (CKD). Calculated eGFR is useful in patients with stable renal function. The eGFR calculation will not be reliable in acutely ill patients when serum creatinine is changing rapidly. It is not useful in  patients on dialysis. The eGFR calculation may not be applicable to patients at the low and high extremes of body sizes, pregnant women, and vegetarians.    GFR, Estimated  Date Value Ref Range Status  10/23/2022 32 (L) >60 mL/min Final    Comment:    (NOTE) Calculated using the CKD-EPI Creatinine  Equation (2021) Performed at [facility], [address] [place], [place] [address]    eGFR  Date Value Ref Range Status  09/04/2022 35 (L) >59 mL/min/1.73 Final         Passed - Completed PHQ-2 or PHQ-9 in the last 360 days      Passed - Last BP in normal range    BP Readings from Last 1 Encounters:  04/03/23 100/60         Passed - Valid encounter within last 6 months    Recent Outpatient Visits           3 months ago Acute cystitis without hematuria   [place] [facility] [place], [place] P, DO   3 months ago Acute cystitis without hematuria   [place] [facility] [place], Megan P, DO   4 months ago Type 2 diabetes mellitus with diabetic peripheral angiopathy without gangrene, without long-term current use of insulin (HCC)   [place] [facility], Megan P, DO   7 months ago Essential hypertension   [place] [facility] [place], Megan P, DO   8 months ago Chronic midline back pain, unspecified back location   [facility], Megan P, DO  Future Appointments             In 4 days Laural Benes, Oralia Rud, DO [place] [facility], [facility]

## 2023-04-10 ENCOUNTER — Ambulatory Visit: Payer: Medicare Other | Admitting: Family Medicine

## 2023-04-11 ENCOUNTER — Telehealth: Payer: Self-pay | Admitting: Family Medicine

## 2023-04-11 NOTE — Telephone Encounter (Signed)
I am happy to do this when I see her on Friday to make sure it's covered.

## 2023-04-11 NOTE — Telephone Encounter (Signed)
Left message for [facility] Nurse Case Manager with [facility] to make her aware that Dr Laural Benes says she will discuss referral with patient at her scheduled appointment on Friday.     OK for [facility] to give note if [facility] calls back.

## 2023-04-11 NOTE — Telephone Encounter (Signed)
Copied from CRM [id number]. Topic: General - Other >> Apr 11, 2023 11:51 AM Epimenio Foot F wrote: Reason for CRM: Neysa Bonito, a Nurse Case Manager with [facility] is calling in because pt is requesting a referral for Home Health. Neysa Bonito says [facility] can take her, they just need the referral.

## 2023-04-12 ENCOUNTER — Ambulatory Visit: Payer: Medicare Other | Admitting: Emergency Medicine

## 2023-04-12 VITALS — Ht 66.0 in | Wt 177.0 lb

## 2023-04-12 DIAGNOSIS — Z0000 Encounter for general adult medical examination without abnormal findings: Secondary | ICD-10-CM

## 2023-04-12 DIAGNOSIS — Z1231 Encounter for screening mammogram for malignant neoplasm of breast: Secondary | ICD-10-CM

## 2023-04-12 NOTE — Progress Notes (Signed)
Subjective:   Tammy [name] is a 78 y.o. female who presents for Medicare Annual (Subsequent) preventive examination.  Interactive audio and video telecommunications were attempted between this provider and patient, however failed, due to patient having technical difficulties OR patient did not have access to video capability.  We continued and completed visit with audio only.   Visit Complete: Virtual I connected with  Tammy [name] on 04/12/23 by a audio enabled telemedicine application and verified that I am speaking with the correct person using two identifiers.  Patient Location: Home  Provider Location: Home Office  I discussed the limitations of evaluation and management by telemedicine. The patient expressed understanding and agreed to proceed.  Vital Signs: Because this visit was a virtual/telehealth visit, some criteria may be missing or patient reported. Any vitals not documented were not able to be obtained and vitals that have been documented are patient reported.   Cardiac Risk Factors include: advanced age (>87men, >70 women);hypertension;diabetes mellitus;dyslipidemia;Other (see comment), Risk factor comments: CAD     Objective:    Today's Vitals   04/12/23 1516  Weight: 177 lb (80.3 kg)  Height: 5\' 6"  (1.676 m)  PainSc: 5    Body mass index is 28.57 kg/m.     10/23/2022   12:26 PM 10/05/2022    1:07 PM 08/21/2022    3:15 PM 08/01/2022    3:54 PM 08/01/2022    3:33 PM 07/12/2022    2:16 PM 06/28/2022   10:23 AM  Advanced Directives  Does Patient Have a Medical Advance Directive? No No No No No No No  Would patient like information on creating a medical advance directive? --   No - Patient declined No - Patient declined      Current Medications (verified) Outpatient Encounter Medications as of 04/12/2023  Medication Sig   acetaminophen (TYLENOL) 500 MG tablet Take 500 mg by mouth every 6 (six) hours as needed for moderate pain. Takes up to 3 tablets/day PRN for  increased pain   albuterol (VENTOLIN HFA) 108 (90 Base) MCG/ACT inhaler Inhale 2 puffs into the lungs every 6 (six) hours as needed for wheezing or shortness of breath.   anastrozole (ARIMIDEX) 1 MG tablet TAKE 1 TABLET BY MOUTH DAILY   apixaban (ELIQUIS) 2.5 MG TABS tablet Take 1 tablet (2.5 mg total) by mouth 2 (two) times daily.   Calcium Carb-Cholecalciferol (CALCIUM 600 + D) 600-200 MG-UNIT TABS Take 1 tablet by mouth daily.    cephALEXin (KEFLEX) 500 MG capsule Take 500 mg by mouth daily.   cholecalciferol (VITAMIN D3) 25 MCG (1000 UT) tablet Take 1,000 Units by mouth daily.   Continuous Glucose Receiver (FREESTYLE LIBRE 2 READER) DEVI As directed   CRANBERRY PO Take 2 capsules by mouth daily.   DULoxetine (CYMBALTA) 60 MG capsule TAKE 2 CAPSULES BY MOUTH ONCE DAILY   fluticasone (FLONASE) 50 MCG/ACT nasal spray Place 1 spray into both nostrils daily.   Fluticasone-Umeclidin-Vilant (TRELEGY ELLIPTA) 100-62.5-25 MCG/ACT AEPB Inhale 1 puff into the lungs daily.   gabapentin (NEURONTIN) 300 MG capsule Take 2 capsules (600 mg total) by mouth 3 (three) times daily.   glipiZIDE (GLUCOTROL) 5 MG tablet TAKE 1 TABLET BY MOUTH ONCE DAILY BEFOREBREAKFAST   isosorbide mononitrate (IMDUR) 30 MG 24 hr tablet Take 1 tablet (30 mg total) by mouth at bedtime.   levothyroxine (SYNTHROID) 75 MCG tablet Take 1 tablet (75 mcg total) by mouth daily before breakfast.   metoprolol tartrate (LOPRESSOR) 50 MG tablet TAKE  ONE (1) TABLET BY MOUTH TWO TIMES PER DAY   montelukast (SINGULAIR) 10 MG tablet Take 1 tablet (10 mg total) by mouth at bedtime.   MYRBETRIQ 50 MG TB24 tablet TAKE 1 TABLET BY MOUTH DAILY   nitroGLYCERIN (NITROSTAT) 0.4 MG SL tablet Place 1 tablet (0.4 mg total) under the tongue every 5 (five) minutes as needed for chest pain. Total of 3 doses   ondansetron (ZOFRAN) 4 MG tablet Take 1 tablet (4 mg total) by mouth every 8 (eight) hours as needed for nausea or vomiting.   pantoprazole (PROTONIX) 40  MG tablet TAKE 1 TABLET BY MOUTH ONCE DAILY   Pumpkin Seed-Soy Germ (AZO BLADDER CONTROL/GO-LESS PO) Take 1 tablet by mouth in the morning and at bedtime.   rosuvastatin (CRESTOR) 40 MG tablet Take 1 tablet (40 mg total) by mouth daily.   tirzepatide (MOUNJARO) 12.5 MG/0.5ML Pen Inject 12.5 mg into the skin once a week.   trimethoprim (TRIMPEX) 100 MG tablet TAKE ONE TABLET BY MOUTH EVERY DAY   ZINC CITRATE PO Take 1 tablet by mouth daily.   Continuous Glucose Sensor (FREESTYLE LIBRE 2 SENSOR) MISC 1 each by Does not apply route every 14 (fourteen) days.   No facility-administered encounter medications on file as of 04/12/2023.    Allergies (verified) Hydrocodone, Aleve [naproxen sodium], Contrast media [iodinated contrast media], Ioxaglate, Oxycodone, Ranexa [ranolazine], Sulfa antibiotics, and Tramadol   History: Past Medical History:  Diagnosis Date   Asthma    Benign neoplasm of colon    Breast cancer (HCC) 08/2018   left breast   Cervical disc disease    "bulging" - no limitations per pt   Chronic diastolic CHF (congestive heart failure) (HCC)    a. 07/2012 Echo: Nl EF. mild inferoseptal HK, Gr 2 DD, mild conc LVH, mild PR/TR, mild PAH; b. 08/2019 Echo: EF 55-60%, no rwma, Gr1 DD. Nl RV size/fxn.   CKD (chronic kidney disease), stage III (HCC)    COPD (chronic obstructive pulmonary disease) (HCC)    Coronary artery disease    a. 11/2011 Cath: LAD 86m/d, LCX min irregs, RCA 70p, 13m with L->R collats, EF 60%-->Med Rx; b. 03/2019 Cath: LM nl, LAD 40-77m. LCX large, mild diff dzs. OM1/2 nl. RCA known to be 138m. L-->R collats from LAD & LCX/OM.   Diabetes mellitus without complication (HCC)    Diagnosis unknown 05/07/2022   Fusion of lumbar spine 12/22/2015   GERD (gastroesophageal reflux disease)    History of DVT (deep vein thrombosis)    History of tobacco abuse    a. Quit 2011.   Hyperlipidemia    Hypertension    Hypothyroidism    Lung cancer (HCC)    Malignant neoplasm of  unspecified site of right female breast (HCC) 03/14/1999   Obesity    Palpitations    Personal history of radiation therapy    PONV (postoperative nausea and vomiting)    Pulmonary embolus (HCC) 12/25/2013   RLL. Presented with SOB and elevated d-dimer.    PVD (peripheral vascular disease) (HCC)    a. PTA of right leg and left femoral artery with stenosis; b. 09/2020 ABI: R 0.93, L 0.88 - unchanged from prior study.   Stroke [facility]) 2015   TIAs - no deficits   Past Surgical History:  Procedure Laterality Date   ANGIOPLASTY / STENTING FEMORAL     PVD; angioplasty right leg and left femoral artery with stenosis.    APPENDECTOMY  12/2014   BACK SURGERY  03/2012  nerve stimulator inserted   BACK SURGERY     screws, rods replaced with new hardware.   BACK SURGERY  11/2016   BREAST LUMPECTOMY Left 08/26/2018   BREAST SURGERY     CARDIAC CATHETERIZATION  12/07/2011   Mid LAD 50%, distal LAD 50%, mid RCA 70%, distal RCA 100%    CARDIAC CATHETERIZATION  01/04/2011   100% occluded mid RCA with good collaterals from distal LAD, normal LVEF.   CATARACT EXTRACTION Right 12/21/2021   CHOLECYSTECTOMY     COLONOSCOPY  2013   COLONOSCOPY WITH PROPOFOL N/A 05/24/2015   Procedure: COLONOSCOPY WITH PROPOFOL;  Surgeon: Midge Minium, MD;  Location: [facility];  Service: Endoscopy;  Laterality: N/A;  Diabetic - oral meds   COLONOSCOPY WITH PROPOFOL N/A 01/28/2019   Procedure: COLONOSCOPY WITH PROPOFOL;  Surgeon: Pasty Spillers, MD;  Location: [facility] ENDOSCOPY;  Service: Endoscopy;  Laterality: N/A;   DIAGNOSTIC LAPAROSCOPY     ESOPHAGOGASTRODUODENOSCOPY (EGD) WITH PROPOFOL N/A 01/28/2019   Procedure: ESOPHAGOGASTRODUODENOSCOPY (EGD) WITH PROPOFOL;  Surgeon: Pasty Spillers, MD;  Location: [facility] ENDOSCOPY;  Service: Endoscopy;  Laterality: N/A;   LAPAROSCOPIC APPENDECTOMY N/A 01/06/2015   Procedure: APPENDECTOMY LAPAROSCOPIC;  Surgeon: Kieth Brightly, MD;  Location: [facility] ORS;   Service: General;  Laterality: N/A;   LEFT HEART CATH AND CORONARY ANGIOGRAPHY Left 04/04/2019   Procedure: LEFT HEART CATH AND CORONARY ANGIOGRAPHY;  Surgeon: Antonieta Iba, MD;  Location: [facility] INV[facility] CV [facility];  Service: Cardiovascular;  Laterality: Left;   LOWER EXTREMITY INTERVENTION Right 10/23/2022   Procedure: Lower Extremity Angiography;  Surgeon: Annice Needy, MD;  Location: [facility] IN[facility] CV LAB;  Service: Cardiovascular;  Laterality: Right;   MASTECTOMY W/ SENTINEL NODE BIOPSY Left 08/26/2018   Procedure: PARTIAL MASTECTOMY WIDE EXCISION WITH SENTINEL LYMPH NODE BIOPSY LEFT;  Surgeon: Earline Mayotte, MD;  Location: [facility] ORS;  Service: General;  Laterality: Left;   POLYPECTOMY  05/24/2015   Procedure: POLYPECTOMY;  Surgeon: Midge Minium, MD;  Location: [facility];  Service: Endoscopy;;   REPLACEMENT TOTAL KNEE Right    RIGHT/LEFT HEART CATH AND CORONARY ANGIOGRAPHY Bilateral 09/26/2021   Procedure: RIGHT/LEFT HEART CATH AND CORONARY ANGIOGRAPHY;  Surgeon: Iran Ouch, MD;  Location: [facility] INVAS[facility] CV LAB;  Service: Cardiovascular;  Laterality: Bilateral;   THYROIDECTOMY     TOTAL KNEE ARTHROPLASTY Right 11/13/2019   TRIGGER FINGER RELEASE     Family History  Problem Relation Age of Onset   Cancer Mother        colon   Hypertension Father    Heart disease Father    Heart attack Father    Diabetes Father    Cancer Sister        lung   COPD Sister    COPD Sister    Heart attack Brother    Diabetes Brother    Hypertension Brother    Heart disease Brother    Heart attack Brother    Diabetes Brother    Hypertension Brother    Heart disease Brother    Heart attack Brother    Diabetes Brother    Hypertension Brother    Heart disease Brother    Colon cancer Brother 59   Lung cancer Brother    Breast cancer Neg Hx    Social History   Socioeconomic History   Marital status: Married    Spouse name: Not on file   Number of children: 2   Years of  education: some college, Scientist, product/process development college for  interior design    Highest education level: Associate degree: occupational, Scientist, product/process development, or vocational program  Occupational History   Occupation: Retired  Tobacco Use   Smoking status: Former    Current packs/day: 0.00    Average packs/day: 0.5 packs/day for 15.0 years (7.5 ttl pk-yrs)    Types: Cigarettes    Start date: 03/13/1992    Quit date: 03/14/2007    Years since quitting: 16.0   Smokeless tobacco: Never  Vaping Use   Vaping status: Never Used  Substance and Sexual Activity   Alcohol use: No    Alcohol/week: 0.0 standard drinks of alcohol   Drug use: Never   Sexual activity: Not Currently    Birth control/protection: Post-menopausal  Other Topics Concern   Not on file  Social History Narrative   Lives at home with [place] in [place]. Quit smoking 10 years ago; never alcohol. 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    Social Drivers of Corporate investment banker Strain: Low Risk  (04/12/2023)   Overall Financial Resource Strain (CARDIA)    Difficulty of Paying Living Expenses: Not hard at all  Food Insecurity: No Food Insecurity (04/12/2023)   Hunger Vital Sign    Worried About Running Out of Food in the Last Year: Never true    Ran Out of Food in the Last Year: Never true  Transportation Needs: No Transportation Needs (04/12/2023)   PRAPARE - Administrator, Civil Service (Medical): No    Lack of Transportation (Non-Medical): No  Physical Activity: Inactive (04/12/2023)   Exercise Vital Sign    Days of Exercise per Week: 0 days    Minutes of Exercise per Session: 0 min  Stress: Stress Concern Present (04/12/2023)   [employer] of Occupational Health - Occupational Stress Questionnaire    Feeling of Stress : To some extent  Social Connections: Moderately Integrated (04/12/2023)   Social Connection and Isolation Panel [NHANES]    Frequency of Communication with Friends  and Family: More than three times a week    Frequency of Social Gatherings with Friends and Family: More than three times a week    Attends Religious Services: More than 4 times per year    Active Member of [employer] or Organizations: No    Attends Engineer, structural: Never    Marital Status: Married    Tobacco Counseling Counseling given: Not Answered   Clinical Intake:  Pre-visit preparation completed: Yes  Pain : 0-10 Pain Score: 5  Pain Type: Acute pain Pain Location: Head Pain Descriptors / Indicators: Headache     BMI - recorded: 28.57 Nutritional Status: BMI 25 -29 Overweight Nutritional Risks: None Diabetes: Yes CBG done?: No (FBS 106 per patient) Did pt. bring in CBG monitor from home?: No  How often do you need to have someone help you when you read instructions, pamphlets, or other written materials from your doctor or pharmacy?: 1 - Never  Interpreter Needed?: No  Information entered by :: Tora Kindred, CMA   Activities of Daily Living    04/12/2023    3:21 PM 10/23/2022   12:22 PM  In your present state of health, do you have any difficulty performing the following activities:  Hearing? 0 0  Vision? 0 0  Difficulty concentrating or making decisions? 0 0  Walking or climbing stairs? 1 1  Comment uses cane or walker as needed   Dressing or bathing? 0 0  Doing errands, shopping? 0   Preparing Food and eating ? N   Using the Toilet? N   In the past six months, have you accidently leaked urine? Y   Comment wears adult underwear   Do you have problems with loss of bowel control? N   Managing your Medications? N   Managing your Finances? N   Housekeeping or managing your Housekeeping? N     Patient Care Team: Dorcas Carrow, DO as PCP - General (Family Medicine) Mariah Milling [name], MD as PCP - Cardiology (Cardiology) Antonieta Iba, MD as Consulting Physician (Cardiology) Riki Altes, MD as Consulting Physician (Urology) Lamont Dowdy, MD as Consulting Physician (Nephrology) [name], MD as Referring Physician (Orthopedic Surgery) Alan Mulder, MD as Attending Physician (Endocrinology) Annice Needy, MD as Referring Physician (Vascular Surgery) Earna Coder, MD as Consulting Physician (Oncology) Rodney Langton, RN as Case Manager (General Practice)  Indicate any recent Medical Services you may have received from other than [facility] providers in the past year (date may be approximate).     Assessment:   This is a routine wellness examination for Tammy [name].  Hearing/Vision screen Vision Screening - Comments:: Gets eye exams [facility]   Goals Addressed               This Visit's Progress     Patient Stated (pt-stated)        Lose 20 lbs      Depression Screen    04/12/2023    3:34 PM 01/01/2023    9:24 AM 12/25/2022    3:39 PM 12/05/2022    2:21 PM 09/04/2022    2:30 PM 08/21/2022    3:15 PM 08/08/2022    2:29 PM  PHQ 2/9 Scores  PHQ - 2 Score 0 0 0 0 0 0 0  PHQ- 9 Score  0 0 0 0  0    Fall Risk    04/12/2023    3:38 PM 12/25/2022    3:38 PM 12/05/2022    2:21 PM 10/05/2022    1:07 PM 10/02/2022   10:44 AM  Fall Risk   Falls in the past year? 1 1 1  0 0  Number falls in past yr: 1 1 0 0   Injury with Fall? 1 1 0    Risk for fall due to : History of fall(s);Impaired balance/gait;Orthopedic patient;Impaired mobility History of fall(s) History of fall(s) No Fall Risks   Follow up Education provided;Falls prevention discussed;Falls evaluation completed Falls evaluation completed Falls evaluation completed Falls evaluation completed     MEDICARE RISK AT HOME: Medicare Risk at Home Any stairs in or around the home?: Yes If so, are there any without handrails?: No Home free of loose throw rugs in walkways, pet beds, electrical cords, etc?: Yes Adequate lighting in your home to reduce risk of falls?: Yes Life alert?: No Use of a cane, walker or w/c?: Yes Grab bars in  the bathroom?: Yes Shower chair or bench in shower?: Yes Elevated toilet seat or a handicapped toilet?: Yes  TIMED UP AND GO:  Was the test performed?  No    Cognitive Function:        04/12/2023    3:39 PM 12/26/2021   12:22 PM 12/20/2020    2:41 PM 12/19/2019    2:46 PM 11/11/2019    2:41 PM  6CIT Screen  What Year? 0 points 0 points 0 points 0 points 0 points  What month?  0 points 0 points 0 points 0 points 0 points  What time? 3 points 0 points 0 points 0 points 0 points  Count back from 20 0 points 0 points 0 points 0 points 0 points  Months in reverse 0 points 0 points 0 points 0 points 0 points  Repeat phrase 0 points 0 points 0 points 0 points 0 points  Total Score 3 points 0 points 0 points 0 points 0 points    Immunizations Immunization History  Administered Date(s) Administered   Fluad Quad(high Dose 65+) 01/02/2019, 02/10/2020, 12/30/2020, 12/06/2021   Influenza, High Dose Seasonal PF 11/16/2016, 12/18/2017   Influenza,inj,Quad PF,6+ Mos 12/16/2014, 11/24/2015   PFIZER(Purple Top)SARS-COV-2 Vaccination 05/08/2019, 05/29/2019   Pneumococcal Conjugate-13 07/08/2014   Pneumococcal Polysaccharide-23 03/13/2012   Zoster, Live 03/13/2010    TDAP status: Due, Education has been provided regarding the importance of this vaccine. Advised may receive this vaccine at local pharmacy or Health Dept. Aware to provide a copy of the vaccination record if obtained from local pharmacy or Health Dept. Verbalized acceptance and understanding.  Flu Vaccine status: Due, Education has been provided regarding the importance of this vaccine. Advised may receive this vaccine at local pharmacy or Health Dept. Aware to provide a copy of the vaccination record if obtained from local pharmacy or Health Dept. Verbalized acceptance and understanding.  Pneumococcal vaccine status: Up to date  Covid-19 vaccine status: Declined, Education has been provided regarding the importance of this vaccine  but patient still declined. Advised may receive this vaccine at local pharmacy or Health Dept.or vaccine clinic. Aware to provide a copy of the vaccination record if obtained from local pharmacy or Health Dept. Verbalized acceptance and understanding.  Qualifies for Shingles Vaccine? Yes   Zostavax completed Yes   Shingrix Completed?: No.    Education has been provided regarding the importance of this vaccine. Patient has been advised to call insurance company to determine out of pocket expense if they have not yet received this vaccine. Advised may also receive vaccine at local pharmacy or Health Dept. Verbalized acceptance and understanding.  Screening Tests Health Maintenance  Topic Date Due   DTaP/Tdap/Td (1 - Tdap) Never done   Zoster Vaccines- Shingrix (1 of 2) 08/19/1962   COVID-19 Vaccine (3 - Pfizer risk series) 06/26/2019   Colonoscopy  01/28/2020   FOOT EXAM  09/09/2022   INFLUENZA VACCINE  10/12/2022   HEMOGLOBIN A1C  06/04/2023   Diabetic kidney evaluation - Urine ACR  06/20/2023   OPHTHALMOLOGY EXAM  10/03/2023   Diabetic kidney evaluation - eGFR measurement  10/23/2023   Medicare Annual Wellness (AWV)  04/11/2024   MAMMOGRAM  07/31/2024   Pneumonia Vaccine 16+ Years old  Completed   DEXA SCAN  Completed   Hepatitis C Screening  Completed   HPV VACCINES  Aged Out    Health Maintenance  Health Maintenance Due  Topic Date Due   DTaP/Tdap/Td (1 - Tdap) Never done   Zoster Vaccines- Shingrix (1 of 2) 08/19/1962   COVID-19 Vaccine (3 - Pfizer risk series) 06/26/2019   Colonoscopy  01/28/2020   FOOT EXAM  09/09/2022   INFLUENZA VACCINE  10/12/2022    Colorectal cancer screening: No longer required.   Mammogram status: Ordered 04/12/23. Pt provided with contact info and advised to call to schedule appt.   Bone Density status: Completed 05/26/19. Results reflect: Bone density results: OSTEOPENIA. Repeat every 5 years. Scheduled 04/16/23  Lung Cancer Screening: (Low Dose  CT Chest recommended if Age  50-80 years, 20 pack-year currently smoking OR have quit w/in 15years.) does not qualify.   Lung Cancer Screening Referral: n/a  Additional Screening:  Hepatitis C Screening: does not qualify; Completed 07/14/16  Vision Screening: Recommended annual ophthalmology exams for early detection of glaucoma and other disorders of the eye. Is the patient up to date with their annual eye exam?  Yes  Who is the provider or what is the name of the office in which the patient attends annual eye exams? [facility] [place] If pt is not established with a provider, would they like to be referred to a provider to establish care? No .   Dental Screening: Recommended annual dental exams for proper oral hygiene  Diabetic Foot Exam: Diabetic Foot Exam: Overdue, Pt has been advised about the importance in completing this exam. Pt is scheduled for diabetic foot exam on 04/13/23 at next OV.  Community Resource Referral / Chronic Care Management: CRR required this visit?  No   CCM required this visit?  No     Plan:     I have personally reviewed and noted the following in the patient's chart:   Medical and social history Use of alcohol, tobacco or illicit drugs  Current medications and supplements including opioid prescriptions. Patient is not currently taking opioid prescriptions. Functional ability and status Nutritional status Physical activity Advanced directives List of other physicians Hospitalizations, surgeries, and ER visits in previous 12 months Vitals Screenings to include cognitive, depression, and falls Referrals and appointments  In addition, I have reviewed and discussed with patient certain preventive protocols, quality metrics, and best practice recommendations. A written personalized care plan for preventive services as well as general preventive health recommendations were provided to patient.     Tora Kindred, CMA   04/12/2023   After Visit  Summary: (MyChart) Due to this being a telephonic visit, the after visit summary with patients personalized plan was offered to patient via MyChart   Nurse Notes:  6 CIT Score - 3 Needs Tdap, shingles and flu vaccines. Declined DM & Nutrition education Declined Covid vaccine Placed an order for a MMG due 08/01/23.

## 2023-04-12 NOTE — Patient Instructions (Addendum)
Tammy [name] , Thank you for taking time to come for your Medicare Wellness Visit. I appreciate your ongoing commitment to your health goals. Please review the following plan we discussed and let me know if I can assist you in the future.   Referrals/Orders/Follow-Ups/Clinician Recommendations: Get the tetanus, shingles and flu vaccine at your convenience. I have placed an order for a mammogram that will be due 08/01/23. Call [facility] @ [phone] to schedule at your convenience.  This is a list of the screening recommended for you and due dates:  Health Maintenance  Topic Date Due   DTaP/Tdap/Td vaccine (1 - Tdap) Never done   Zoster (Shingles) Vaccine (1 of 2) 08/19/1962   COVID-19 Vaccine (3 - Pfizer risk series) 06/26/2019   Colon Cancer Screening  01/28/2020   Complete foot exam   09/09/2022   Flu Shot  06/11/2023*   Hemoglobin A1C  06/04/2023   Yearly kidney health urinalysis for diabetes  06/20/2023   Mammogram  08/01/2023   Eye exam for diabetics  10/03/2023   Yearly kidney function blood test for diabetes  10/23/2023   Medicare Annual Wellness Visit  04/11/2024   Pneumonia Vaccine  Completed   DEXA scan (bone density measurement)  Completed   Hepatitis C Screening  Completed   HPV Vaccine  Aged Out  *Topic was postponed. The date shown is not the original due date.    Advanced directives: (Copy Requested) Please bring a copy of your health care power of attorney and living will to the office to be added to your chart at your convenience.  Next Medicare Annual Wellness Visit scheduled for next year: Yes, 04/24/24 @ 3:10pm (phone visit)  Fall Prevention in the Home, Adult Falls can cause injuries and affect people of all ages. There are many simple things that you can do to make your home safe and to help prevent falls. If you need it, ask for help making these changes. What actions can I take to prevent falls? General information Use good lighting in all rooms.  Make sure to: Replace any light bulbs that burn out. Turn on lights if it is dark and use night-lights. Keep items that you use often in easy-to-reach places. Lower the shelves around your home if needed. Move furniture so that there are clear paths around it. Do not keep throw rugs or other things on the floor that can make you trip. If any of your floors are uneven, fix them. Add color or contrast paint or tape to clearly mark and help you see: Grab bars or handrails. First and last steps of staircases. Where the edge of each step is. If you use a ladder or stepladder: Make sure that it is fully opened. Do not climb a closed ladder. Make sure the sides of the ladder are locked in place. Have someone hold the ladder while you use it. Know where your pets are as you move through your home. What can I do in the bathroom?     Keep the floor dry. Clean up any water that is on the floor right away. Remove soap buildup in the bathtub or shower. Buildup makes bathtubs and showers slippery. Use non-skid mats or decals on the floor of the bathtub or shower. Attach bath mats securely with double-sided, non-slip rug tape. If you need to sit down while you are in the shower, use a non-slip stool. Install grab bars by the toilet and in the bathtub and shower. Do not use  towel bars as grab bars. What can I do in the bedroom? Make sure that you have a light by your bed that is easy to reach. Do not use any sheets or blankets on your bed that hang to the floor. Have a firm bench or chair with side arms that you can use for support when you get dressed. What can I do in the kitchen? Clean up any spills right away. If you need to reach something above you, use a sturdy step stool that has a grab bar. Keep electrical cables out of the way. Do not use floor polish or wax that makes floors slippery. What can I do with my stairs? Do not leave anything on the stairs. Make sure that you have a light  switch at the top and the bottom of the stairs. Have them installed if you do not have them. Make sure that there are handrails on both sides of the stairs. Fix handrails that are broken or loose. Make sure that handrails are as long as the staircases. Install non-slip stair treads on all stairs in your home if they do not have carpet. Avoid having throw rugs at the top or bottom of stairs, or secure the rugs with carpet tape to prevent them from moving. Choose a carpet design that does not hide the edge of steps on the stairs. Make sure that carpet is firmly attached to the stairs. Fix any carpet that is loose or worn. What can I do on the outside of my home? Use bright outdoor lighting. Repair the edges of walkways and driveways and fix any cracks. Clear paths of anything that can make you trip, such as tools or rocks. Add color or contrast paint or tape to clearly mark and help you see high doorway thresholds. Trim any bushes or trees on the main path into your home. Check that handrails are securely fastened and in good repair. Both sides of all steps should have handrails. Install guardrails along the edges of any raised decks or porches. Have leaves, snow, and ice cleared regularly. Use sand, salt, or ice melt on walkways during winter months if you live where there is ice and snow. In the garage, clean up any spills right away, including grease or oil spills. What other actions can I take? Review your medicines with your health care provider. Some medicines can make you confused or feel dizzy. This can increase your chance of falling. Wear closed-toe shoes that fit well and support your feet. Wear shoes that have rubber soles and low heels. Use a cane, walker, scooter, or crutches that help you move around if needed. Talk with your provider about other ways that you can decrease your risk of falls. This may include seeing a physical therapist to learn to do exercises to improve movement and  strength. Where to find more information Centers for Disease Control and Prevention, STEAD[employer]: TonerPromos.no [employer] on Aging: BaseRingTones.pl National Institute on Aging: BaseRingTones.pl Contact a health care provider if: You are afraid of falling at home. You feel weak, drowsy, or dizzy at home. You fall at home. Get help right away if you: Lose consciousness or have trouble moving after a fall. Have a fall that causes a head injury. These symptoms may be an emergency. Get help right away. Call 911. Do not wait to see if the symptoms will go away. Do not drive yourself to the hospital. This information is not intended to replace advice given to you by your  health care provider. Make sure you discuss any questions you have with your health care provider. Document Revised: 10/31/2021 Document Reviewed: 10/31/2021 Elsevier Patient Education  2024 [employer].

## 2023-04-13 ENCOUNTER — Encounter: Payer: Self-pay | Admitting: Family Medicine

## 2023-04-13 ENCOUNTER — Ambulatory Visit (INDEPENDENT_AMBULATORY_CARE_PROVIDER_SITE_OTHER): Payer: Medicare Other | Admitting: Family Medicine

## 2023-04-13 VITALS — BP 105/67 | HR 63 | Wt 181.4 lb

## 2023-04-13 DIAGNOSIS — I10 Essential (primary) hypertension: Secondary | ICD-10-CM

## 2023-04-13 DIAGNOSIS — I700 Atherosclerosis of aorta: Secondary | ICD-10-CM

## 2023-04-13 DIAGNOSIS — I25110 Atherosclerotic heart disease of native coronary artery with unstable angina pectoris: Secondary | ICD-10-CM | POA: Diagnosis not present

## 2023-04-13 DIAGNOSIS — I70211 Atherosclerosis of native arteries of extremities with intermittent claudication, right leg: Secondary | ICD-10-CM | POA: Diagnosis not present

## 2023-04-13 DIAGNOSIS — E039 Hypothyroidism, unspecified: Secondary | ICD-10-CM

## 2023-04-13 DIAGNOSIS — I739 Peripheral vascular disease, unspecified: Secondary | ICD-10-CM | POA: Diagnosis not present

## 2023-04-13 DIAGNOSIS — M545 Low back pain, unspecified: Secondary | ICD-10-CM

## 2023-04-13 DIAGNOSIS — G8929 Other chronic pain: Secondary | ICD-10-CM | POA: Diagnosis not present

## 2023-04-13 DIAGNOSIS — E782 Mixed hyperlipidemia: Secondary | ICD-10-CM | POA: Diagnosis not present

## 2023-04-13 DIAGNOSIS — E1151 Type 2 diabetes mellitus with diabetic peripheral angiopathy without gangrene: Secondary | ICD-10-CM | POA: Diagnosis not present

## 2023-04-13 DIAGNOSIS — I2699 Other pulmonary embolism without acute cor pulmonale: Secondary | ICD-10-CM

## 2023-04-13 DIAGNOSIS — R269 Unspecified abnormalities of gait and mobility: Secondary | ICD-10-CM

## 2023-04-13 LAB — BAYER DCA HB A1C WAIVED: HB A1C (BAYER DCA - WAIVED): 6.5 % — ABNORMAL HIGH (ref 4.8–5.6)

## 2023-04-13 NOTE — Progress Notes (Unsigned)
BP 105/67   Pulse 63   Wt 181 lb 6.4 oz (82.3 kg)   SpO2 95%   BMI 29.28 kg/m    Subjective:    Patient ID: Tammy [name], female    DOB: May 30, 1945, 78 y.o.   MRN: [id number]  HPI: Tammy [name] is a 78 y.o. female  Chief Complaint  Patient presents with   Diabetes   Larey Seat yesterday and hurt her back and her knee.   DIABETES Hypoglycemic episodes:{Blank single:19197::"yes","no"} Polydipsia/polyuria: {Blank single:19197::"yes","no"} Visual disturbance: {Blank single:19197::"yes","no"} Chest pain: {Blank single:19197::"yes","no"} Paresthesias: {Blank single:19197::"yes","no"} Glucose Monitoring: {Blank single:19197::"yes","no"}  Accucheck frequency: {Blank single:19197::"Not Checking","Daily","BID","TID"}  Fasting glucose:  Post prandial:  Evening:  Before meals: Taking Insulin?: {Blank single:19197::"yes","no"}  Long acting insulin:  Short acting insulin: Blood Pressure Monitoring: {Blank single:19197::"not checking","rarely","daily","weekly","monthly","a few times a day","a few times a week","a few times a month"} Retinal Examination: {Blank single:19197::"Up to Date","Not up to Date"} Foot Exam: {Blank single:19197::"Up to Date","Not up to Date"} Diabetic Education: {Blank single:19197::"Completed","Not Completed"} Pneumovax: {Blank single:19197::"Up to Date","Not up to Date","unknown"} Influenza: {Blank single:19197::"Up to Date","Not up to Date","unknown"} Aspirin: {Blank single:19197::"yes","no"}  HYPERTENSION / HYPERLIPIDEMIA Satisfied with current treatment? {Blank single:19197::"yes","no"} Duration of hypertension: {Blank single:19197::"chronic","months","years"} BP monitoring frequency: {Blank single:19197::"not checking","rarely","daily","weekly","monthly","a few times a day","a few times a week","a few times a month"} BP range:  BP medication side effects: {Blank single:19197::"yes","no"} Past BP meds: {Blank  multiple:19196::"none","amlodipine","amlodipine/benazepril","atenolol","benazepril","benazepril/HCTZ","bisoprolol (bystolic)","carvedilol","chlorthalidone","clonidine","diltiazem","exforge HCT","HCTZ","irbesartan (avapro)","labetalol","lisinopril","lisinopril-HCTZ","losartan (cozaar)","methyldopa","nifedipine","olmesartan (benicar)","olmesartan-HCTZ","quinapril","ramipril","spironalactone","tekturna","valsartan","valsartan-HCTZ","verapamil"} Duration of hyperlipidemia: {Blank single:19197::"chronic","months","years"} Cholesterol medication side effects: {Blank single:19197::"yes","no"} Cholesterol supplements: {Blank multiple:19196::"none","fish oil","niacin","red yeast rice"} Past cholesterol medications: {Blank multiple:19196::"none","atorvastain (lipitor)","lovastatin (mevacor)","pravastatin (pravachol)","rosuvastatin (crestor)","simvastatin (zocor)","vytorin","fenofibrate (tricor)","gemfibrozil","ezetimide (zetia)","niaspan","lovaza"} Medication compliance: {Blank single:19197::"excellent compliance","good compliance","fair compliance","poor compliance"} Aspirin: {Blank single:19197::"yes","no"} Recent stressors: {Blank single:19197::"yes","no"} Recurrent headaches: {Blank single:19197::"yes","no"} Visual changes: {Blank single:19197::"yes","no"} Palpitations: {Blank single:19197::"yes","no"} Dyspnea: {Blank single:19197::"yes","no"} Chest pain: {Blank single:19197::"yes","no"} Lower extremity edema: {Blank single:19197::"yes","no"} Dizzy/lightheaded: {Blank single:19197::"yes","no"}  HYPOTHYROIDISM -lost her thyroid medicine and has not been taking it for about a week Thyroid control status:{Blank single:19197::"controlled","uncontrolled","better","worse","exacerbated","stable"} Satisfied with current treatment? {Blank single:19197::"yes","no"} Medication side effects: {Blank single:19197::"yes","no"} Medication compliance: {Blank single:19197::"excellent compliance","good compliance","fair  compliance","poor compliance"} Etiology of hypothyroidism:  Recent dose adjustment:{Blank single:19197::"yes","no"} Fatigue: {Blank single:19197::"yes","no"} Cold intolerance: {Blank single:19197::"yes","no"} Heat intolerance: {Blank single:19197::"yes","no"} Weight gain: {Blank single:19197::"yes","no"} Weight loss: {Blank single:19197::"yes","no"} Constipation: {Blank single:19197::"yes","no"} Diarrhea/loose stools: {Blank single:19197::"yes","no"} Palpitations: {Blank single:19197::"yes","no"} Lower extremity edema: {Blank single:19197::"yes","no"} Anxiety/depressed mood: {Blank single:19197::"yes","no"}   Relevant past medical, surgical, family and social history reviewed and updated as indicated. Interim medical history since our last visit reviewed. Allergies and medications reviewed and updated.  Review of Systems  Per HPI unless specifically indicated above     Objective:    BP 105/67   Pulse 63   Wt 181 lb 6.4 oz (82.3 kg)   SpO2 95%   BMI 29.28 kg/m   Wt Readings from Last 3 Encounters:  04/13/23 181 lb 6.4 oz (82.3 kg)  04/12/23 177 lb (80.3 kg)  04/03/23 179 lb 8 oz (81.4 kg)    Physical Exam  Results for orders placed or performed in visit on 01/17/23  TSH   Collection Time: 01/17/23 10:12 AM  Result Value Ref Range   TSH 0.123 (L) 0.450 - 4.500 uIU/mL   *Note: Due to a large number of results and/or encounters for the requested time period, some results have not been displayed. A complete set of results can be found in Results Review.      Assessment & Plan:   Problem List Items Addressed This Visit  Cardiovascular and Mediastinum   Coronary atherosclerosis of native coronary artery (Chronic)   Relevant Orders   CBC with Differential/Platelet   Comprehensive metabolic panel   Lipid Panel w/o Chol/HDL Ratio   PAD (peripheral artery disease) (HCC) (Chronic)   Relevant Orders   CBC with Differential/Platelet   Comprehensive metabolic panel    Lipid Panel w/o Chol/HDL Ratio   Essential hypertension - Primary (Chronic)   Relevant Orders   CBC with Differential/Platelet   Comprehensive metabolic panel   Microalbumin, Urine Waived   Pulmonary embolism (HCC)   Relevant Orders   CBC with Differential/Platelet   Comprehensive metabolic panel   Atherosclerosis of native arteries of extremity with intermittent claudication (HCC)   Relevant Orders   CBC with Differential/Platelet   Comprehensive metabolic panel   Lipid Panel w/o Chol/HDL Ratio   Type 2 diabetes mellitus with diabetic peripheral angiopathy without gangrene (HCC)   Relevant Orders   CBC with Differential/Platelet   Comprehensive metabolic panel   Lipid Panel w/o Chol/HDL Ratio   Bayer DCA Hb A1c Waived   Microalbumin, Urine Waived   Atherosclerosis of aorta (HCC)   Relevant Orders   CBC with Differential/Platelet   Comprehensive metabolic panel   Lipid Panel w/o Chol/HDL Ratio     Endocrine   Hypothyroid   Relevant Orders   CBC with Differential/Platelet   Comprehensive metabolic panel   TSH     Follow up plan: No follow-ups on file.

## 2023-04-14 LAB — CBC WITH DIFFERENTIAL/PLATELET
Basophils Absolute: 0.1 10*3/uL (ref 0.0–0.2)
Basos: 1 %
EOS (ABSOLUTE): 0.5 10*3/uL — ABNORMAL HIGH (ref 0.0–0.4)
Eos: 5 %
Hematocrit: 44.2 % (ref 34.0–46.6)
Hemoglobin: 14 g/dL (ref 11.1–15.9)
Immature Grans (Abs): 0.1 10*3/uL (ref 0.0–0.1)
Immature Granulocytes: 1 %
Lymphocytes Absolute: 3 10*3/uL (ref 0.7–3.1)
Lymphs: 31 %
MCH: 29.1 pg (ref 26.6–33.0)
MCHC: 31.7 g/dL (ref 31.5–35.7)
MCV: 92 fL (ref 79–97)
Monocytes Absolute: 0.9 10*3/uL (ref 0.1–0.9)
Monocytes: 9 %
Neutrophils Absolute: 5.1 10*3/uL (ref 1.4–7.0)
Neutrophils: 53 %
Platelets: 244 10*3/uL (ref 150–450)
RBC: 4.81 x10E6/uL (ref 3.77–5.28)
RDW: 14.5 % (ref 11.7–15.4)
WBC: 9.6 10*3/uL (ref 3.4–10.8)

## 2023-04-14 LAB — LIPID PANEL W/O CHOL/HDL RATIO
Cholesterol, Total: 178 mg/dL (ref 100–199)
HDL: 61 mg/dL (ref >39)
LDL Chol Calc (NIH): 76 mg/dL (ref 0–99)
Triglycerides: 252 mg/dL — ABNORMAL HIGH (ref 0–149)
VLDL Cholesterol Cal: 41 mg/dL — ABNORMAL HIGH (ref 5–40)

## 2023-04-14 LAB — COMPREHENSIVE METABOLIC PANEL
ALT: 53 [IU]/L — ABNORMAL HIGH (ref 0–32)
AST: 59 [IU]/L — ABNORMAL HIGH (ref 0–40)
Albumin: 4.4 g/dL (ref 3.8–4.8)
Alkaline Phosphatase: 96 [IU]/L (ref 44–121)
BUN/Creatinine Ratio: 18 (ref 12–28)
BUN: 39 mg/dL — ABNORMAL HIGH (ref 8–27)
Bilirubin Total: 0.5 mg/dL (ref 0.0–1.2)
CO2: 19 mmol/L — ABNORMAL LOW (ref 20–29)
Calcium: 9.5 mg/dL (ref 8.7–10.3)
Chloride: 103 mmol/L (ref 96–106)
Creatinine, Ser: 2.17 mg/dL — ABNORMAL HIGH (ref 0.57–1.00)
Globulin, Total: 2.7 g/dL (ref 1.5–4.5)
Glucose: 125 mg/dL — ABNORMAL HIGH (ref 70–99)
Potassium: 5 mmol/L (ref 3.5–5.2)
Sodium: 142 mmol/L (ref 134–144)
Total Protein: 7.1 g/dL (ref 6.0–8.5)
eGFR: 23 mL/min/{1.73_m2} — ABNORMAL LOW (ref >59)

## 2023-04-14 LAB — TSH: TSH: 89 u[IU]/mL — ABNORMAL HIGH (ref 0.450–4.500)

## 2023-04-15 ENCOUNTER — Encounter: Payer: Self-pay | Admitting: Family Medicine

## 2023-04-15 MED ORDER — ISOSORBIDE MONONITRATE ER 30 MG PO TB24: 30.0000 mg | ORAL_TABLET | Freq: Every day | ORAL | 3 refills | Status: DC

## 2023-04-15 MED ORDER — APIXABAN 2.5 MG PO TABS: 2.5000 mg | ORAL_TABLET | Freq: Two times a day (BID) | ORAL | 1 refills | Status: DC

## 2023-04-15 MED ORDER — METOPROLOL TARTRATE 25 MG PO TABS: 25.0000 mg | ORAL_TABLET | Freq: Two times a day (BID) | ORAL | 3 refills | Status: DC

## 2023-04-15 MED ORDER — MONTELUKAST SODIUM 10 MG PO TABS: 10.0000 mg | ORAL_TABLET | Freq: Every day | ORAL | 0 refills | Status: DC

## 2023-04-15 MED ORDER — ROSUVASTATIN CALCIUM 40 MG PO TABS: 40.0000 mg | ORAL_TABLET | Freq: Every day | ORAL | 1 refills | Status: DC

## 2023-04-15 NOTE — Assessment & Plan Note (Signed)
 Doing well with A1c of 6.5. Continue current regimen. Continue to monitor. Call with any concerns.

## 2023-04-15 NOTE — Assessment & Plan Note (Signed)
 Rechecking labs today. Await results. Treat as needed.

## 2023-04-15 NOTE — Assessment & Plan Note (Signed)
Will keep her BP and cholesterol under good control. Continue to monitor. Labs drawn today.  ?

## 2023-04-15 NOTE — Assessment & Plan Note (Signed)
Continue eliquis. Continue to monitor. Call with any concerns.

## 2023-04-15 NOTE — Assessment & Plan Note (Signed)
 Under good control on current regimen. Continue current regimen. Continue to monitor. Call with any concerns. Refills given. Labs drawn today.

## 2023-04-15 NOTE — Assessment & Plan Note (Signed)
BP running low. Will cut her metoprolol to 25mg  and recheck in about a month. Call with any concerns.

## 2023-04-15 NOTE — Assessment & Plan Note (Signed)
Needs home health PT. Ordered today.

## 2023-04-16 ENCOUNTER — Other Ambulatory Visit: Payer: Medicare Other

## 2023-04-16 ENCOUNTER — Other Ambulatory Visit: Payer: Self-pay

## 2023-04-20 ENCOUNTER — Ambulatory Visit
Admission: RE | Admit: 2023-04-20 | Discharge: 2023-04-20 | Disposition: A | Discharge status: Home / Self Care | Payer: Medicare Other | Source: Ambulatory Visit | Attending: Internal Medicine | Admitting: Internal Medicine

## 2023-04-20 DIAGNOSIS — Z170 Estrogen receptor positive status [ER+]: Secondary | ICD-10-CM | POA: Insufficient documentation

## 2023-04-20 DIAGNOSIS — Z780 Asymptomatic menopausal state: Secondary | ICD-10-CM | POA: Insufficient documentation

## 2023-04-20 DIAGNOSIS — M85832 Other specified disorders of bone density and structure, left forearm: Secondary | ICD-10-CM | POA: Diagnosis not present

## 2023-04-20 DIAGNOSIS — M8580 Other specified disorders of bone density and structure, unspecified site: Secondary | ICD-10-CM | POA: Diagnosis not present

## 2023-04-20 DIAGNOSIS — C50312 Malignant neoplasm of lower-inner quadrant of left female breast: Secondary | ICD-10-CM | POA: Insufficient documentation

## 2023-04-21 ENCOUNTER — Other Ambulatory Visit: Payer: Self-pay | Admitting: Family Medicine

## 2023-04-22 ENCOUNTER — Other Ambulatory Visit: Payer: Self-pay | Admitting: Family Medicine

## 2023-04-22 ENCOUNTER — Encounter: Payer: Self-pay | Admitting: Family Medicine

## 2023-04-22 DIAGNOSIS — E039 Hypothyroidism, unspecified: Secondary | ICD-10-CM

## 2023-04-22 DIAGNOSIS — N289 Disorder of kidney and ureter, unspecified: Secondary | ICD-10-CM

## 2023-04-22 MED ORDER — LEVOTHYROXINE SODIUM 75 MCG PO TABS: 75.0000 ug | ORAL_TABLET | Freq: Every day | ORAL | 0 refills | Status: DC

## 2023-04-23 NOTE — Telephone Encounter (Signed)
 Requested medication (s) are due for refill today: routing for review  Requested medication (s) are on the active medication list: yes  Last refill:  unknown  Future visit scheduled: yes  Notes to clinic:  Unable to refill per protocol, Rx expired. Medication not on current list, possible new Rx. Routing for review.      Requested Prescriptions  Pending Prescriptions Disp Refills   cetirizine  (ZYRTEC ) 10 MG tablet [Pharmacy Med Name: CETIRIZINE  HCL 10 MG TAB] 30 tablet     Sig: TAKE 1 TABLET BY MOUTH DAILY     Ear, Nose, and Throat:  Antihistamines 2 Failed - 04/23/2023  1:51 PM      Failed - Cr in normal range and within 360 days    Creatinine  Date Value Ref Range Status  09/20/2011 1.31 (H) 0.60 - 1.30 mg/dL Final   Creatinine, Ser  Date Value Ref Range Status  04/13/2023 2.17 (H) 0.57 - 1.00 mg/dL Final         Passed - Valid encounter within last 12 months    Recent Outpatient Visits           1 week ago Essential hypertension   [place] [facility] [place], Megan P, DO   3 months ago Acute cystitis without hematuria   [place] [facility] [place], Megan P, DO   3 months ago Acute cystitis without hematuria   [place] [facility] [place], Megan P, DO   4 months ago Type 2 diabetes mellitus with diabetic peripheral angiopathy without gangrene, without long-term current use of insulin  [facility])   [place] [facility] [place], Megan P, DO   7 months ago Essential hypertension   [place] [facility] [place], Jerilee Montane, DO       Future Appointments             In 2 months [place] Renshaw, Jerilee Montane, DO [facility]

## 2023-04-25 ENCOUNTER — Encounter (INDEPENDENT_AMBULATORY_CARE_PROVIDER_SITE_OTHER): Payer: Medicare Other

## 2023-04-25 ENCOUNTER — Ambulatory Visit (INDEPENDENT_AMBULATORY_CARE_PROVIDER_SITE_OTHER): Payer: Medicare Other | Admitting: Nurse Practitioner

## 2023-04-30 ENCOUNTER — Inpatient Hospital Stay: Payer: Medicare Other | Attending: Internal Medicine

## 2023-04-30 ENCOUNTER — Inpatient Hospital Stay (HOSPITAL_BASED_OUTPATIENT_CLINIC_OR_DEPARTMENT_OTHER): Payer: Medicare Other | Admitting: Internal Medicine

## 2023-04-30 ENCOUNTER — Encounter: Payer: Self-pay | Admitting: Internal Medicine

## 2023-04-30 VITALS — BP 106/74 | HR 62 | Temp 96.8°F | Resp 16 | Wt 179.0 lb

## 2023-04-30 DIAGNOSIS — Z9012 Acquired absence of left breast and nipple: Secondary | ICD-10-CM | POA: Diagnosis not present

## 2023-04-30 DIAGNOSIS — M810 Age-related osteoporosis without current pathological fracture: Secondary | ICD-10-CM | POA: Diagnosis not present

## 2023-04-30 DIAGNOSIS — E1122 Type 2 diabetes mellitus with diabetic chronic kidney disease: Secondary | ICD-10-CM | POA: Diagnosis not present

## 2023-04-30 DIAGNOSIS — C3412 Malignant neoplasm of upper lobe, left bronchus or lung: Secondary | ICD-10-CM | POA: Diagnosis not present

## 2023-04-30 DIAGNOSIS — Z86718 Personal history of other venous thrombosis and embolism: Secondary | ICD-10-CM | POA: Diagnosis not present

## 2023-04-30 DIAGNOSIS — Z87891 Personal history of nicotine dependence: Secondary | ICD-10-CM | POA: Diagnosis not present

## 2023-04-30 DIAGNOSIS — C50312 Malignant neoplasm of lower-inner quadrant of left female breast: Secondary | ICD-10-CM

## 2023-04-30 DIAGNOSIS — N183 Chronic kidney disease, stage 3 unspecified: Secondary | ICD-10-CM | POA: Insufficient documentation

## 2023-04-30 DIAGNOSIS — Z8673 Personal history of transient ischemic attack (TIA), and cerebral infarction without residual deficits: Secondary | ICD-10-CM | POA: Insufficient documentation

## 2023-04-30 DIAGNOSIS — I739 Peripheral vascular disease, unspecified: Secondary | ICD-10-CM | POA: Diagnosis not present

## 2023-04-30 DIAGNOSIS — Z801 Family history of malignant neoplasm of trachea, bronchus and lung: Secondary | ICD-10-CM | POA: Diagnosis not present

## 2023-04-30 DIAGNOSIS — M19012 Primary osteoarthritis, left shoulder: Secondary | ICD-10-CM

## 2023-04-30 DIAGNOSIS — Z923 Personal history of irradiation: Secondary | ICD-10-CM | POA: Diagnosis not present

## 2023-04-30 DIAGNOSIS — M818 Other osteoporosis without current pathological fracture: Secondary | ICD-10-CM

## 2023-04-30 DIAGNOSIS — Z780 Asymptomatic menopausal state: Secondary | ICD-10-CM

## 2023-04-30 DIAGNOSIS — I129 Hypertensive chronic kidney disease with stage 1 through stage 4 chronic kidney disease, or unspecified chronic kidney disease: Secondary | ICD-10-CM | POA: Insufficient documentation

## 2023-04-30 DIAGNOSIS — Z170 Estrogen receptor positive status [ER+]: Secondary | ICD-10-CM | POA: Diagnosis not present

## 2023-04-30 DIAGNOSIS — M549 Dorsalgia, unspecified: Secondary | ICD-10-CM | POA: Insufficient documentation

## 2023-04-30 DIAGNOSIS — Z79811 Long term (current) use of aromatase inhibitors: Secondary | ICD-10-CM | POA: Insufficient documentation

## 2023-04-30 DIAGNOSIS — M8580 Other specified disorders of bone density and structure, unspecified site: Secondary | ICD-10-CM | POA: Diagnosis not present

## 2023-04-30 DIAGNOSIS — R5382 Chronic fatigue, unspecified: Secondary | ICD-10-CM | POA: Insufficient documentation

## 2023-04-30 DIAGNOSIS — J449 Chronic obstructive pulmonary disease, unspecified: Secondary | ICD-10-CM | POA: Insufficient documentation

## 2023-04-30 DIAGNOSIS — I509 Heart failure, unspecified: Secondary | ICD-10-CM | POA: Insufficient documentation

## 2023-04-30 DIAGNOSIS — M4802 Spinal stenosis, cervical region: Secondary | ICD-10-CM

## 2023-04-30 LAB — CBC WITH DIFFERENTIAL (CANCER CENTER ONLY)
Abs Immature Granulocytes: 0.08 10*3/uL — ABNORMAL HIGH (ref 0.00–0.07)
Basophils Absolute: 0.1 10*3/uL (ref 0.0–0.1)
Basophils Relative: 1 %
Eosinophils Absolute: 0.4 10*3/uL (ref 0.0–0.5)
Eosinophils Relative: 4 %
HCT: 43.2 % (ref 36.0–46.0)
Hemoglobin: 13.5 g/dL (ref 12.0–15.0)
Immature Granulocytes: 1 %
Lymphocytes Relative: 34 %
Lymphs Abs: 3 10*3/uL (ref 0.7–4.0)
MCH: 29.9 pg (ref 26.0–34.0)
MCHC: 31.3 g/dL (ref 30.0–36.0)
MCV: 95.6 fL (ref 80.0–100.0)
Monocytes Absolute: 0.9 10*3/uL (ref 0.1–1.0)
Monocytes Relative: 10 %
Neutro Abs: 4.5 10*3/uL (ref 1.7–7.7)
Neutrophils Relative %: 50 %
Platelet Count: 228 10*3/uL (ref 150–400)
RBC: 4.52 MIL/uL (ref 3.87–5.11)
RDW: 16 % — ABNORMAL HIGH (ref 11.5–15.5)
WBC Count: 9 10*3/uL (ref 4.0–10.5)
nRBC: 0 % (ref 0.0–0.2)

## 2023-04-30 LAB — BASIC METABOLIC PANEL - CANCER CENTER ONLY
Anion gap: 9 (ref 5–15)
BUN: 42 mg/dL — ABNORMAL HIGH (ref 8–23)
CO2: 26 mmol/L (ref 22–32)
Calcium: 8.8 mg/dL — ABNORMAL LOW (ref 8.9–10.3)
Chloride: 103 mmol/L (ref 98–111)
Creatinine: 1.77 mg/dL — ABNORMAL HIGH (ref 0.44–1.00)
GFR, Estimated: 29 mL/min — ABNORMAL LOW (ref >60)
Glucose, Bld: 72 mg/dL (ref 70–99)
Potassium: 5 mmol/L (ref 3.5–5.1)
Sodium: 138 mmol/L (ref 135–145)

## 2023-04-30 LAB — VITAMIN D 25 HYDROXY (VIT D DEFICIENCY, FRACTURES): Vit D, 25-Hydroxy: 69.26 ng/mL (ref 30–100)

## 2023-04-30 MED ORDER — ANASTROZOLE 1 MG PO TABS: 1.0000 mg | ORAL_TABLET | Freq: Every day | ORAL | 1 refills | Status: DC

## 2023-04-30 NOTE — Progress Notes (Signed)
Pt in for follow up, reports having pain in left breast regularly and sometimes a shooting pain.

## 2023-04-30 NOTE — Assessment & Plan Note (Addendum)
#  Left upper lobe lung nodule 6 mm MAY 2021-slow-growing currently 9 mm May 2022 CT scan.  Difficulty biopsy-peripheral/subpleural.  S/p SBRT [June 2022];  MAY 2024- on-contrast CT Chest- Small right lung nodules measuring up to 4 mm, likely benign. Would recommend CT scan in 6 month.    # Left breast stage I breast cancer ER PR positive HER-2 negative; status post lumpectomy.  On arimidex [until sep 2025- x 5 years]. Mammogram-May 2024 - left mammo-WNL [Dr.Cintron];stable. Tolerating well except for hot flashes see below  # Hot flashes -grade 1- monitor for now. Stable.   # BMD- FEB 2021- T score;  - 1.5. FEB 2025- T-score of -1.7.HOLD off Bisphosphates- on ca+vit D- stable..   # CKD- III-IV- GFR-29 .[NOV 2021-]; stable- Dr.Kolluru/follow-up with PCP.  # Diabetes- [CGM] Poorly controlled- 88 -improved- recent Hb A1c-  6.5  [from 9.5] - stable/.  Non- contrast CT   # DISPOSITION:  #  Follow-up in 6 months MD;  labs-CBC BMP;vit D 25 OH levels- CT chest prior -Dr.B

## 2023-04-30 NOTE — Progress Notes (Signed)
[facility] CONSULT NOTE  Patient Care Team: Dorcas Carrow, DO as PCP - General (Family Medicine) Mariah Milling, [name], MD as PCP - Cardiology (Cardiology) Antonieta Iba, MD as Consulting Physician (Cardiology) Riki Altes, MD as Consulting Physician (Urology) Lamont Dowdy, MD as Consulting Physician (Nephrology) [name], MD as Referring Physician (Orthopedic Surgery) Alan Mulder, MD as Attending Physician (Endocrinology) Annice Needy, MD as Referring Physician (Vascular Surgery) Earna Coder, MD as Consulting Physician (Oncology) Rodney Langton, RN as Case Manager (General Practice)  CHIEF COMPLAINTS/PURPOSE OF CONSULTATION: Breast cancer  #  Oncology History Overview Note   # July 2020 Left Breast- IMC; 14 mm; Grade: 2; Lymphovascular Invasion: Not identified;  pT1c pN0(sn); ER positive, PR positive, HER2 negative. [s/p lumpec; Dr.Byrnett]; rs/p RT [finished Sep, 9th 2020]  # Sep end 2020- START Arimidexa  # JAN 2022- 66mm/left upper lobe lung nodule; PET positive; right upper lobe pneumonia/suspected reactive adenopathy. STAGE [name] Lung ca [suspected based on imaging no biopsy]- SBRT Dr.Crystal [May 2022]  # CKD-III/COPD/ CHF-compesated/PAD; CAD  DIAGNOSIS: Left breast cancer  STAGE: 1  ;GOALS: Cure  CURRENT/MOST RECENT THERAPY: arimidex   Malignant neoplasm of lower-inner quadrant of left female breast (HCC) (Resolved)  08/14/2018 Initial Diagnosis   Malignant neoplasm of lower-inner quadrant of left female breast [facility])   Carcinoma of lower-inner quadrant of left breast in female, estrogen receptor positive (HCC)  10/02/2018 Initial Diagnosis   Carcinoma of lower-inner quadrant of left breast in female, estrogen receptor positive (HCC)    HISTORY OF PRESENTING ILLNESS:In a motorized scooter.  Accompanied by her husband.  Tammy [name] 78 y.o.  female to history of stage [name] ER PR positive/HER-2 negative breast cancer ; and also  stage [name] presumed left upper lobe lung cancer [NO bX]-s/p SBRT is here for follow-up/.  Pt in for follow up, reports having pain in left breast regularly and sometimes a shooting pain.  This is chronic.  Not getting any worse.  Pt taking her arimidex. No side effects right now. Appetite is okay.   Chronic back pain is chronic fatigue.  Denies any new lumps or bumps.   Review of Systems  Constitutional:  Negative for chills, diaphoresis, fever, malaise/fatigue and weight loss.  HENT:  Negative for nosebleeds and sore throat.   Eyes:  Negative for double vision.  Respiratory:  Negative for cough, hemoptysis, sputum production, shortness of breath and wheezing.   Cardiovascular:  Negative for chest pain, palpitations, orthopnea and leg swelling.  Gastrointestinal:  Negative for abdominal pain, blood in stool, constipation, diarrhea, heartburn, melena, nausea and vomiting.  Genitourinary:  Negative for dysuria, frequency and urgency.  Musculoskeletal:  Positive for back pain and joint pain.  Skin: Negative.  Negative for itching and rash.  Neurological:  Negative for dizziness, tingling, focal weakness, weakness and headaches.  Endo/Heme/Allergies:  Does not bruise/bleed easily.  Psychiatric/Behavioral:  Negative for depression. The patient is not nervous/anxious and does not have insomnia.      MEDICAL HISTORY:  Past Medical History:  Diagnosis Date   Asthma    Benign neoplasm of colon    Breast cancer (HCC) 08/2018   left breast   Cervical disc disease    "bulging" - no limitations per pt   Chronic diastolic CHF (congestive heart failure) (HCC)    a. 07/2012 Echo: Nl EF. mild inferoseptal HK, Gr 2 DD, mild conc LVH, mild PR/TR, mild PAH; b. 08/2019 Echo: EF 55-60%, no rwma, Gr1 DD.  Nl RV size/fxn.   CKD (chronic kidney disease), stage III (HCC)    COPD (chronic obstructive pulmonary disease) (HCC)    Coronary artery disease    a. 11/2011 Cath: LAD 48m/d, LCX min irregs, RCA 70p, 161m  with L->R collats, EF 60%-->Med Rx; b. 03/2019 Cath: LM nl, LAD 40-63m. LCX large, mild diff dzs. OM1/2 nl. RCA known to be 175m. L-->R collats from LAD & LCX/OM.   Diabetes mellitus without complication (HCC)    Diagnosis unknown 05/07/2022   Fusion of lumbar spine 12/22/2015   GERD (gastroesophageal reflux disease)    History of DVT (deep vein thrombosis)    History of tobacco abuse    a. Quit 2011.   Hyperlipidemia    Hypertension    Hypothyroidism    Lung cancer (HCC)    Malignant neoplasm of unspecified site of right female breast (HCC) 03/14/1999   Obesity    Palpitations    Personal history of radiation therapy    PONV (postoperative nausea and vomiting)    Pulmonary embolus (HCC) 12/25/2013   RLL. Presented with SOB and elevated d-dimer.    PVD (peripheral vascular disease) (HCC)    a. PTA of right leg and left femoral artery with stenosis; b. 09/2020 ABI: R 0.93, L 0.88 - unchanged from prior study.   Stroke (HCC) 2015   TIAs - no deficits    SURGICAL HISTORY: Past Surgical History:  Procedure Laterality Date   ANGIOPLASTY / STENTING FEMORAL     PVD; angioplasty right leg and left femoral artery with stenosis.    APPENDECTOMY  12/2014   BACK SURGERY  03/2012   nerve stimulator inserted   BACK SURGERY     screws, rods replaced with new hardware.   BACK SURGERY  11/2016   BREAST LUMPECTOMY Left 08/26/2018   BREAST SURGERY     CARDIAC CATHETERIZATION  12/07/2011   Mid LAD 50%, distal LAD 50%, mid RCA 70%, distal RCA 100%    CARDIAC CATHETERIZATION  01/04/2011   100% occluded mid RCA with good collaterals from distal LAD, normal LVEF.   CATARACT EXTRACTION Right 12/21/2021   CHOLECYSTECTOMY     COLONOSCOPY  2013   COLONOSCOPY WITH PROPOFOL N/A 05/24/2015   Procedure: COLONOSCOPY WITH PROPOFOL;  Surgeon: Midge Minium, MD;  Location: [facility];  Service: Endoscopy;  Laterality: N/A;  Diabetic - oral meds   COLONOSCOPY WITH PROPOFOL N/A 01/28/2019   Procedure:  COLONOSCOPY WITH PROPOFOL;  Surgeon: Pasty Spillers, MD;  Location: [facility];  Service: Endoscopy;  Laterality: N/A;   DIAGNOSTIC LAPAROSCOPY     ESOPHAGOGASTRODUODENOSCOPY (EGD) WITH PROPOFOL N/A 01/28/2019   Procedure: ESOPHAGOGASTRODUODENOSCOPY (EGD) WITH PROPOFOL;  Surgeon: Pasty Spillers, MD;  Location: [facility];  Service: Endoscopy;  Laterality: N/A;   LAPAROSCOPIC APPENDECTOMY N/A 01/06/2015   Procedure: APPENDECTOMY LAPAROSCOPIC;  Surgeon: Kieth Brightly, MD;  Location: [facility];  Service: General;  Laterality: N/A;   LEFT HEART CATH AND CORONARY ANGIOGRAPHY Left 04/04/2019   Procedure: LEFT HEART CATH AND CORONARY ANGIOGRAPHY;  Surgeon: Antonieta Iba, MD;  Location: [facility];  Service: Cardiovascular;  Laterality: Left;   LOWER EXTREMITY INTERVENTION Right 10/23/2022   Procedure: Lower Extremity Angiography;  Surgeon: Annice Needy, MD;  Location: [facility];  Service: Cardiovascular;  Laterality: Right;   MASTECTOMY W/ SENTINEL NODE BIOPSY Left 08/26/2018   Procedure: PARTIAL MASTECTOMY WIDE EXCISION WITH SENTINEL LYMPH NODE BIOPSY LEFT;  Surgeon: Earline Mayotte, MD;  Location:  [facility];  Service: General;  Laterality: Left;   POLYPECTOMY  05/24/2015   Procedure: POLYPECTOMY;  Surgeon: Midge Minium, MD;  Location: [facility];  Service: Endoscopy;;   REPLACEMENT TOTAL KNEE Right    RIGHT/LEFT HEART CATH AND CORONARY ANGIOGRAPHY Bilateral 09/26/2021   Procedure: RIGHT/LEFT HEART CATH AND CORONARY ANGIOGRAPHY;  Surgeon: Iran Ouch, MD;  Location: [facility];  Service: Cardiovascular;  Laterality: Bilateral;   THYROIDECTOMY     TOTAL KNEE ARTHROPLASTY Right 11/13/2019   TRIGGER FINGER RELEASE      SOCIAL HISTORY: Social History   Socioeconomic History   Marital status: Married    Spouse name: Not on file   Number of children: 2   Years of education: some college, Financial trader for interior  design    Highest education level: Associate degree: occupational, Scientist, product/process development, or vocational program  Occupational History   Occupation: Retired  Tobacco Use   Smoking status: Former    Current packs/day: 0.00    Average packs/day: 0.5 packs/day for 15.0 years (7.5 ttl pk-yrs)    Types: Cigarettes    Start date: 03/13/1992    Quit date: 03/14/2007    Years since quitting: 16.1   Smokeless tobacco: Never  Vaping Use   Vaping status: Never Used  Substance and Sexual Activity   Alcohol use: No    Alcohol/week: 0.0 standard drinks of alcohol   Drug use: Never   Sexual activity: Not Currently    Birth control/protection: Post-menopausal  Other Topics Concern   Not on file  Social History Narrative   Lives at home with [place] in [place]. Quit smoking 10 years ago; never alcohol. 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    Social Drivers of Corporate investment banker Strain: Low Risk  (04/12/2023)   Overall Financial Resource Strain (CARDIA)    Difficulty of Paying Living Expenses: Not hard at all  Food Insecurity: No Food Insecurity (04/12/2023)   Hunger Vital Sign    Worried About Running Out of Food in the Last Year: Never true    Ran Out of Food in the Last Year: Never true  Transportation Needs: No Transportation Needs (04/12/2023)   PRAPARE - Administrator, Civil Service (Medical): No    Lack of Transportation (Non-Medical): No  Physical Activity: Inactive (04/12/2023)   Exercise Vital Sign    Days of Exercise per Week: 0 days    Minutes of Exercise per Session: 0 min  Stress: Stress Concern Present (04/12/2023)   [employer] of Occupational Health - Occupational Stress Questionnaire    Feeling of Stress : To some extent  Social Connections: Moderately Integrated (04/12/2023)   Social Connection and Isolation Panel [NHANES]    Frequency of Communication with Friends and Family: More than three times a week    Frequency  of Social Gatherings with Friends and Family: More than three times a week    Attends Religious Services: More than 4 times per year    Active Member of [employer] or Organizations: No    Attends Banker Meetings: Never    Marital Status: Married  Catering manager Violence: Not At Risk (04/12/2023)   Humiliation, Afraid, Rape, and Kick questionnaire    Fear of Current or Ex-Partner: No    Emotionally Abused: No    Physically Abused: No    Sexually Abused: No    FAMILY HISTORY: Family History  Problem Relation  Age of Onset   Cancer Mother        colon   Hypertension Father    Heart disease Father    Heart attack Father    Diabetes Father    Cancer Sister        lung   COPD Sister    COPD Sister    Heart attack Brother    Diabetes Brother    Hypertension Brother    Heart disease Brother    Heart attack Brother    Diabetes Brother    Hypertension Brother    Heart disease Brother    Heart attack Brother    Diabetes Brother    Hypertension Brother    Heart disease Brother    Colon cancer Brother 66   Lung cancer Brother    Breast cancer Neg Hx     ALLERGIES:  is allergic to hydrocodone, aleve [naproxen sodium], contrast media [iodinated contrast media], ioxaglate, oxycodone, ranexa [ranolazine], sulfa antibiotics, and tramadol.  MEDICATIONS:  Current Outpatient Medications  Medication Sig Dispense Refill   acetaminophen (TYLENOL) 500 MG tablet Take 500 mg by mouth every 6 (six) hours as needed for moderate pain. Takes up to 3 tablets/day PRN for increased pain     albuterol (VENTOLIN HFA) 108 (90 Base) MCG/ACT inhaler Inhale 2 puffs into the lungs every 6 (six) hours as needed for wheezing or shortness of breath. 18 g 3   apixaban (ELIQUIS) 2.5 MG TABS tablet Take 1 tablet (2.5 mg total) by mouth 2 (two) times daily. 180 tablet 1   Calcium Carb-Cholecalciferol (CALCIUM 600 + D) 600-200 MG-UNIT TABS Take 1 tablet by mouth daily.      cephALEXin (KEFLEX) 500 MG  capsule Take 500 mg by mouth daily.     cetirizine (ZYRTEC) 10 MG tablet TAKE 1 TABLET BY MOUTH DAILY 30 tablet 12   cholecalciferol (VITAMIN D3) 25 MCG (1000 UT) tablet Take 1,000 Units by mouth daily.     Continuous Glucose Receiver (FREESTYLE LIBRE 2 READER) DEVI As directed 1 each 12   Continuous Glucose Sensor (FREESTYLE LIBRE 2 SENSOR) MISC 1 each by Does not apply route every 14 (fourteen) days. 26 each 3   CRANBERRY PO Take 2 capsules by mouth daily.     DULoxetine (CYMBALTA) 60 MG capsule TAKE 2 CAPSULES BY MOUTH ONCE DAILY 180 capsule 1   fluticasone (FLONASE) 50 MCG/ACT nasal spray Place 1 spray into both nostrils daily. 16 g 2   Fluticasone-Umeclidin-Vilant (TRELEGY ELLIPTA) 100-62.5-25 MCG/ACT AEPB Inhale 1 puff into the lungs daily. 3 each 4   gabapentin (NEURONTIN) 300 MG capsule Take 2 capsules (600 mg total) by mouth 3 (three) times daily. 540 capsule 1   isosorbide mononitrate (IMDUR) 30 MG 24 hr tablet Take 1 tablet (30 mg total) by mouth at bedtime. 90 tablet 3   levothyroxine (SYNTHROID) 75 MCG tablet Take 1 tablet (75 mcg total) by mouth daily before breakfast. 90 tablet 0   metoprolol tartrate (LOPRESSOR) 25 MG tablet Take 1 tablet (25 mg total) by mouth 2 (two) times daily. 60 tablet 3   montelukast (SINGULAIR) 10 MG tablet Take 1 tablet (10 mg total) by mouth at bedtime. 90 tablet 0   MYRBETRIQ 50 MG TB24 tablet TAKE 1 TABLET BY MOUTH DAILY 30 tablet 2   nitrofurantoin, macrocrystal-monohydrate, (MACROBID) 100 MG capsule Take 100 mg by mouth daily.     nitroGLYCERIN (NITROSTAT) 0.4 MG SL tablet Place 1 tablet (0.4 mg total) under the tongue every 5 (  five) minutes as needed for chest pain. Total of 3 doses 25 tablet 6   ondansetron (ZOFRAN) 4 MG tablet Take 1 tablet (4 mg total) by mouth every 8 (eight) hours as needed for nausea or vomiting. 20 tablet 6   pantoprazole (PROTONIX) 40 MG tablet TAKE 1 TABLET BY MOUTH ONCE DAILY 90 tablet 1   Pumpkin Seed-Soy Germ (AZO BLADDER  CONTROL/GO-LESS PO) Take 1 tablet by mouth in the morning and at bedtime.     rosuvastatin (CRESTOR) 40 MG tablet Take 1 tablet (40 mg total) by mouth daily. 90 tablet 1   tirzepatide (MOUNJARO) 12.5 MG/0.5ML Pen Inject 12.5 mg into the skin once a week. 6 mL 1   ZINC CITRATE PO Take 1 tablet by mouth daily.     anastrozole (ARIMIDEX) 1 MG tablet Take 1 tablet (1 mg total) by mouth daily. 90 tablet 1   No current facility-administered medications for this visit.      [name]  PHYSICAL EXAMINATION: ECOG PERFORMANCE STATUS: 1 - Symptomatic but completely ambulatory  Vitals:   04/30/23 1506  BP: 106/74  Pulse: 62  Resp: 16  Temp: (!) 96.8 F (36 C)     Filed Weights   04/30/23 1506  Weight: 179 lb (81.2 kg)      Physical Exam HENT:     Head: Normocephalic and atraumatic.     Mouth/Throat:     Pharynx: No oropharyngeal exudate.  Eyes:     Pupils: Pupils are equal, round, and reactive to light.  Cardiovascular:     Rate and Rhythm: Normal rate and regular rhythm.  Pulmonary:     Effort: Pulmonary effort is normal. No respiratory distress.     Breath sounds: Normal breath sounds. No wheezing.  Abdominal:     General: Bowel sounds are normal. There is no distension.     Palpations: Abdomen is soft. There is no mass.     Tenderness: There is no abdominal tenderness. There is no guarding or rebound.  Musculoskeletal:        General: No tenderness. Normal range of motion.     Cervical back: Normal range of motion and neck supple.  Skin:    General: Skin is warm.  Neurological:     Mental Status: She is alert and oriented to person, place, and time.  Psychiatric:        Mood and Affect: Affect normal.    LABORATORY DATA:  [name] have reviewed the data as listed Lab Results  Component Value Date   WBC 9.0 04/30/2023   HGB 13.5 04/30/2023   HCT 43.2 04/30/2023   MCV 95.6 04/30/2023   PLT 228 04/30/2023   Recent Labs    06/28/22 1157 07/10/22 1020 08/01/22 1532  09/04/22 1449 10/23/22 1159 04/13/23 1337 04/30/23 1427  NA 141   < > 135 139  --  142 138  K 5.5*   < > 4.6 5.1  --  5.0 5.0  CL 107*   < > 103 103  --  103 103  CO2  --    < > 20* 19*  --  19* 26  GLUCOSE 126*   < > 88 69*  --  125* 72  BUN 25   < > 31* 33* 52* 39* 42*  CREATININE 1.83*   < > 1.46* 1.52* 1.63* 2.17* 1.77*  CALCIUM 9.7   < > 9.2 9.7  --  9.5 8.8*  GFRNONAA  --   --  37*  --  32*  --  29*  PROT 6.6  --   --  6.9  --  7.1  --   ALBUMIN 4.0  --   --  4.3  --  4.4  --   AST 18  --   --  20  --  59*  --   ALT  --   --   --  17  --  53*  --   ALKPHOS 91  --   --  89  --  96  --   BILITOT 0.3  --   --  0.5  --  0.5  --    < > = values in this interval not displayed.    RADIOGRAPHIC STUDIES: [name] have personally reviewed the radiological images as listed and agreed with the findings in the report. DG Bone Density Result Date: 04/20/2023 EXAM: DUAL X-RAY ABSORPTIOMETRY (DXA) FOR BONE MINERAL DENSITY IMPRESSION: Dear Dr Donneta Romberg, Your patient Tammy [name] completed a BMD test on 04/20/2023 using the Lunar iDXA DXA System (software version: 14.10) manufactured by [employer]. The following summarizes the results of our evaluation. Technologist: MTB PATIENT BIOGRAPHICAL: Name: Tammy [name], Tammy [name] Patient ID: [id number] Birth Date: 1946/02/06 Height: 66.0 in. Gender: Female Exam Date: 04/20/2023 Weight: 181.4 lbs. Indications: History of Thyroid removal, Caucasian, History of Fracture (Adult), High Risk Meds, History of Spinal Surgery, History of Breast Cancer, Diabetic, Postmenopausal Fractures: Right lower leg, Tibula/Fibula Right Treatments: calcium w/ vit D, Prednisone, Trulicity, Synthroid, Arimidex DENSITOMETRY RESULTS: Site         Region     Measured Date Measured Age WHO Classification Young Adult T-score BMD         %Change vs. Previous Significant Change (*) DualFemur Neck Left 04/20/2023 79.6 Osteopenia -1.6 0.810 g/cm2 -4.6% - DualFemur Neck Left 05/26/2019 75.7 Osteopenia  -1.4 0.849 g/cm2 - - DualFemur Total Mean 04/20/2023 79.6 Normal -0.9 0.898 g/cm2 -5.4% Yes DualFemur Total Mean 05/26/2019 75.7 Normal -0.5 0.949 g/cm2 - - Left Forearm Radius 33% 04/20/2023 79.6 Osteopenia -1.7 0.723 g/cm2 -11.3% Yes Left Forearm Radius 33% 05/26/2019 75.7 Normal -0.7 0.815 g/cm2 - - ASSESSMENT: The BMD measured at Forearm Radius 33% is 0.723 g/cm2 with a T-score of -1.7. This patient's diagnostic category is LOW BONE MASS/OSTEOPENIA according to World Health Organization [facility]) criteria. The scan quality is good. Lumbar spine was not utilized due to surgical hardware. Comparison to 05/26/2019. Since the prior study, there has been a SIGNIFICANT DECREASE in bone mineral density of the LEFT forearm (-11.3%) and hips (-5.4%). World Science writer [facility]) criteria for post-menopausal, Caucasian Women: Normal:                   T-score at or above -1 SD Osteopenia/low bone mass: T-score between -1 and -2.5 SD Osteoporosis:             T-score at or below -2.5 SD RECOMMENDATIONS: 1. All patients should optimize calcium and vitamin D intake. 2. Consider FDA-approved medical therapies in postmenopausal women and men aged 45 years and older, based on the following: a. A hip or vertebral(clinical or morphometric) fracture b. T-score < -2.5 at the femoral neck or spine after appropriate evaluation to exclude secondary causes c. Low bone mass (T-score between -1.0 and -2.5 at the femoral neck or spine) and a 10-year probability of a hip fracture > 3% or a 10-year probability of a major osteoporosis-related fracture > 20% based on the [place]-adapted WHO algorithm 3. Clinician judgment and/or patient  preferences may indicate treatment for people with 10-year fracture probabilities above or below these levels FOLLOW-UP: People with diagnosed cases of osteoporosis or at high risk for fracture should have regular bone mineral density tests. For patients eligible for Medicare, routine testing is allowed once every 2  years. The testing frequency can be increased to one year for patients who have rapidly progressing disease, those who are receiving or discontinuing medical therapy to restore bone mass, or have additional risk factors. Your patient Tammy UMALI completed a FRAX assessment on 04/20/2023 using the [employer] DXA System (analysis version: 14.10) manufactured by [employer]. The following summarizes the results of our evaluation. PATIENT BIOGRAPHICAL: Name: Tammy [name], Tammy [name] Patient ID: [id number] Birth Date: 10/19/1945 Height:    66.0 in. Gender:     [age]    [age]       Weight:    181.4 lbs. Ethnicity:  White                            Exam Date: 04/20/2023 FRAX* RESULTS:  (version: 3.5) 10-year Probability of Fracture1 Major Osteoporotic Fracture2 Hip Fracture 19.9% 4.5% Population: [place] (Caucasian) Risk Factors: History of Fracture (Adult) Based on Femur (Left) Neck BMD 1 -The 10-year probability of fracture may be lower than reported if the patient has received treatment. 2 -Major Osteoporotic Fracture: Clinical Spine, Forearm, Hip or Shoulder *FRAX is a Armed forces logistics/support/administrative officer of the [employer] of [employer] for Metabolic Bone Disease, a World Science writer (WHO) [employer]. ASSESSMENT: The probability of a major osteoporotic fracture is 19.9 within the next ten years. The probability of a hip fracture is 4.5 within the next ten years. [name] have reviewed this report, and agree with the above findings. [facility] Electronically Signed   By: Harmon Pier M.D.   On: 04/20/2023 09:53     ASSESSMENT & PLAN:   Carcinoma of lower-inner quadrant of left breast in female, estrogen receptor positive (HCC) #Left upper lobe lung nodule 6 mm MAY 2021-slow-growing currently 9 mm May 2022 CT scan.  Difficulty biopsy-peripheral/subpleural.  S/p SBRT [June 2022];  MAY 2024- on-contrast CT Chest- Small right lung nodules measuring up to 4 mm, likely benign. Would recommend CT scan  in 6 month.    # Left breast stage [name] breast cancer ER PR positive HER-2 negative; status post lumpectomy.  On arimidex [until sep 2025- x 5 years]. Mammogram-May 2024 - left mammo-WNL [Dr.Cintron];stable. Tolerating well except for hot flashes see below  # Hot flashes -grade 1- monitor for now. Stable.   # BMD- FEB 2021- T score;  - 1.5. FEB 2025- T-score of -1.7.HOLD off Bisphosphates- on ca+vit D- stable..   # CKD- III-IV- GFR-29 .[NOV 2021-]; stable- Dr.Kolluru/follow-up with PCP.  # Diabetes- [CGM] Poorly controlled- 88 -improved- recent Hb A1c-  6.5  [from 9.5] - stable/.  Non- contrast CT   # DISPOSITION:  #  Follow-up in 6 months MD;  labs-CBC BMP;vit D 25 OH levels- CT chest prior -Dr.B     All questions were answered. The patient/family knows to call the clinic with any problems, questions or concerns.       Earna Coder, MD 04/30/2023 4:29 PM

## 2023-05-01 ENCOUNTER — Other Ambulatory Visit: Payer: Medicare Other

## 2023-05-01 ENCOUNTER — Telehealth: Payer: Self-pay | Admitting: Family Medicine

## 2023-05-01 DIAGNOSIS — I2699 Other pulmonary embolism without acute cor pulmonale: Secondary | ICD-10-CM | POA: Diagnosis not present

## 2023-05-01 DIAGNOSIS — M4802 Spinal stenosis, cervical region: Secondary | ICD-10-CM | POA: Diagnosis not present

## 2023-05-01 DIAGNOSIS — I6523 Occlusion and stenosis of bilateral carotid arteries: Secondary | ICD-10-CM | POA: Diagnosis not present

## 2023-05-01 DIAGNOSIS — I2510 Atherosclerotic heart disease of native coronary artery without angina pectoris: Secondary | ICD-10-CM | POA: Diagnosis not present

## 2023-05-01 DIAGNOSIS — Z96659 Presence of unspecified artificial knee joint: Secondary | ICD-10-CM | POA: Diagnosis not present

## 2023-05-01 DIAGNOSIS — I129 Hypertensive chronic kidney disease with stage 1 through stage 4 chronic kidney disease, or unspecified chronic kidney disease: Secondary | ICD-10-CM | POA: Diagnosis not present

## 2023-05-01 DIAGNOSIS — G8929 Other chronic pain: Secondary | ICD-10-CM | POA: Diagnosis not present

## 2023-05-01 DIAGNOSIS — I700 Atherosclerosis of aorta: Secondary | ICD-10-CM | POA: Diagnosis not present

## 2023-05-01 DIAGNOSIS — I70211 Atherosclerosis of native arteries of extremities with intermittent claudication, right leg: Secondary | ICD-10-CM | POA: Diagnosis not present

## 2023-05-01 DIAGNOSIS — N1832 Chronic kidney disease, stage 3b: Secondary | ICD-10-CM | POA: Diagnosis not present

## 2023-05-01 DIAGNOSIS — M19012 Primary osteoarthritis, left shoulder: Secondary | ICD-10-CM | POA: Diagnosis not present

## 2023-05-01 DIAGNOSIS — M5030 Other cervical disc degeneration, unspecified cervical region: Secondary | ICD-10-CM | POA: Diagnosis not present

## 2023-05-01 DIAGNOSIS — M5136 Other intervertebral disc degeneration, lumbar region with discogenic back pain only: Secondary | ICD-10-CM | POA: Diagnosis not present

## 2023-05-01 DIAGNOSIS — E039 Hypothyroidism, unspecified: Secondary | ICD-10-CM | POA: Diagnosis not present

## 2023-05-01 DIAGNOSIS — M47816 Spondylosis without myelopathy or radiculopathy, lumbar region: Secondary | ICD-10-CM | POA: Diagnosis not present

## 2023-05-01 DIAGNOSIS — J4489 Other specified chronic obstructive pulmonary disease: Secondary | ICD-10-CM | POA: Diagnosis not present

## 2023-05-01 DIAGNOSIS — D631 Anemia in chronic kidney disease: Secondary | ICD-10-CM | POA: Diagnosis not present

## 2023-05-01 DIAGNOSIS — E1151 Type 2 diabetes mellitus with diabetic peripheral angiopathy without gangrene: Secondary | ICD-10-CM | POA: Diagnosis not present

## 2023-05-01 DIAGNOSIS — M170 Bilateral primary osteoarthritis of knee: Secondary | ICD-10-CM | POA: Diagnosis not present

## 2023-05-01 DIAGNOSIS — E782 Mixed hyperlipidemia: Secondary | ICD-10-CM | POA: Diagnosis not present

## 2023-05-01 DIAGNOSIS — M4723 Other spondylosis with radiculopathy, cervicothoracic region: Secondary | ICD-10-CM | POA: Diagnosis not present

## 2023-05-01 DIAGNOSIS — J439 Emphysema, unspecified: Secondary | ICD-10-CM | POA: Diagnosis not present

## 2023-05-01 DIAGNOSIS — Z9181 History of falling: Secondary | ICD-10-CM | POA: Diagnosis not present

## 2023-05-01 DIAGNOSIS — E1122 Type 2 diabetes mellitus with diabetic chronic kidney disease: Secondary | ICD-10-CM | POA: Diagnosis not present

## 2023-05-01 NOTE — Telephone Encounter (Signed)
Home Health Verbal Orders - Caller/Agency: Misty Stanley with [facility] Callback Number: [phone] Service Requested: Physical Therapy Frequency: 8 wks duration at 1 x a wk for 4 weeks and 1 x a wk every 2-4 wks  Any new concerns about the patient? No pt just needs some fine tweaking on balance and strength and her posture with walking. Misty Stanley says her number is secure so leave a voice message if she doesn't pick up. Please assist patient further

## 2023-05-02 NOTE — Telephone Encounter (Signed)
 OK for verbal orders?

## 2023-05-02 NOTE — Telephone Encounter (Signed)
Called and gave verbal orders per Dr. Wynetta Emery.

## 2023-05-07 DIAGNOSIS — M5136 Other intervertebral disc degeneration, lumbar region with discogenic back pain only: Secondary | ICD-10-CM | POA: Diagnosis not present

## 2023-05-07 DIAGNOSIS — G8929 Other chronic pain: Secondary | ICD-10-CM | POA: Diagnosis not present

## 2023-05-07 DIAGNOSIS — M47816 Spondylosis without myelopathy or radiculopathy, lumbar region: Secondary | ICD-10-CM | POA: Diagnosis not present

## 2023-05-07 DIAGNOSIS — I129 Hypertensive chronic kidney disease with stage 1 through stage 4 chronic kidney disease, or unspecified chronic kidney disease: Secondary | ICD-10-CM | POA: Diagnosis not present

## 2023-05-07 DIAGNOSIS — E1151 Type 2 diabetes mellitus with diabetic peripheral angiopathy without gangrene: Secondary | ICD-10-CM | POA: Diagnosis not present

## 2023-05-07 DIAGNOSIS — M4723 Other spondylosis with radiculopathy, cervicothoracic region: Secondary | ICD-10-CM | POA: Diagnosis not present

## 2023-05-15 ENCOUNTER — Ambulatory Visit: Payer: Self-pay | Admitting: *Deleted

## 2023-05-15 ENCOUNTER — Telehealth: Payer: Self-pay

## 2023-05-15 NOTE — Telephone Encounter (Signed)
 FYI to provider.   Copied from CRM [id number]. Topic: General - Other >> May 15, 2023  2:05 PM Santiya F wrote: Reason for CRM: Misty Stanley with [facility] is calling in because she says she went out to get patient started on physical therapy due to a fall, and patient is now saying she doesn't want to do the therapy because she doesn't think it will be beneficial. Misty Stanley says she explained how it would be helpful and patient declined.

## 2023-05-15 NOTE — Patient Outreach (Signed)
 Care Coordination   Follow Up Visit Note   05/15/2023 Name: Tammy [name] MRN: [id number] DOB: 10-01-1945  Tammy [name] is a 78 y.o. year old female who sees Dorcas Carrow, DO for primary care. I spoke with  Tammy [name] by phone today.  What matters to the patients health and wellness today?  Report doing well, chronic conditions are currently stable, has not had any ED visits or hospital admissions. Denies any urgent concerns, encouraged to contact this care manager with questions.      Goals Addressed             This Visit's Progress    COMPLETED: Management of chronic medical conditions       Interventions Today    Flowsheet Row Most Recent Value  Chronic Disease   Chronic disease during today's visit Diabetes, Hypertension (HTN)  General Interventions   General Interventions Discussed/Reviewed Doctor Visits, General Interventions Reviewed, Labs  Labs Hgb A1c every 3 months  [A1C below goal]  Doctor Visits Discussed/Reviewed Doctor Visits Reviewed, PCP  [Next visit scheduled for May]  PCP/Specialist Visits Compliance with follow-up visit  Exercise Interventions   Exercise Discussed/Reviewed Physical Activity  Physical Activity Discussed/Reviewed Physical Activity Reviewed  [Discussed recent visit with PCP and referral to home health for PT, state they came for evaluation but she does not feel it is needed]  Education Interventions   Education Provided Provided Education  Provided Verbal Education On Medication, When to see the doctor  [Medications reviewed, report compliance. Monitors blood pressure and blood sugar daily, state both are "good."]              SDOH assessments and interventions completed:  No     Care Coordination Interventions:  Yes, provided   Follow up plan: No further intervention required.   Encounter Outcome:  Patient Visit Completed   Rodney Langton, RN, MSN, CCM [place]  [facility], [facility] RN Care  Coordinator Direct Dial: [phone] / Main [phone] Fax [phone] Email: [email or web address]

## 2023-05-21 NOTE — Telephone Encounter (Signed)
 Copied from CRM [id number]. Topic: Appointments - Appointment Cancel/Reschedule >> May 17, 2023 12:07 PM Shelah Lewandowsky wrote: Camelia Eng with [facility]- reporting missed Physical Therapy appointment on 05/14/23 and patient is cancelling physical therapy, she is not interested.

## 2023-05-22 DIAGNOSIS — M47816 Spondylosis without myelopathy or radiculopathy, lumbar region: Secondary | ICD-10-CM | POA: Diagnosis not present

## 2023-05-22 DIAGNOSIS — M4723 Other spondylosis with radiculopathy, cervicothoracic region: Secondary | ICD-10-CM | POA: Diagnosis not present

## 2023-05-22 DIAGNOSIS — E1122 Type 2 diabetes mellitus with diabetic chronic kidney disease: Secondary | ICD-10-CM | POA: Diagnosis not present

## 2023-05-22 DIAGNOSIS — N1832 Chronic kidney disease, stage 3b: Secondary | ICD-10-CM | POA: Diagnosis not present

## 2023-05-22 DIAGNOSIS — J439 Emphysema, unspecified: Secondary | ICD-10-CM | POA: Diagnosis not present

## 2023-05-22 DIAGNOSIS — G8929 Other chronic pain: Secondary | ICD-10-CM | POA: Diagnosis not present

## 2023-05-22 DIAGNOSIS — J4489 Other specified chronic obstructive pulmonary disease: Secondary | ICD-10-CM | POA: Diagnosis not present

## 2023-05-22 DIAGNOSIS — E1151 Type 2 diabetes mellitus with diabetic peripheral angiopathy without gangrene: Secondary | ICD-10-CM | POA: Diagnosis not present

## 2023-05-22 DIAGNOSIS — I2699 Other pulmonary embolism without acute cor pulmonale: Secondary | ICD-10-CM | POA: Diagnosis not present

## 2023-05-22 DIAGNOSIS — I700 Atherosclerosis of aorta: Secondary | ICD-10-CM | POA: Diagnosis not present

## 2023-05-22 DIAGNOSIS — M5136 Other intervertebral disc degeneration, lumbar region with discogenic back pain only: Secondary | ICD-10-CM | POA: Diagnosis not present

## 2023-05-22 DIAGNOSIS — I70211 Atherosclerosis of native arteries of extremities with intermittent claudication, right leg: Secondary | ICD-10-CM | POA: Diagnosis not present

## 2023-05-24 ENCOUNTER — Telehealth: Payer: Self-pay

## 2023-05-24 NOTE — Telephone Encounter (Signed)
 Discussed with patient. Since she is no longer on a once daily insulin regimen she no longer qualifies for a CGM per medicare guidelines and since we have to now provide updated notes and are unable to at this time she is not currently eligible. She asked if she could go back to minimal once daily insulin dosing but I explained that is not a decision she can make on her own and needs a visit with provider to discuss. She is scheduled for 06/19/2023 and will discuss them. If she does resume an insulin regimen at that time we will the dot phrase  documented in that note to then provide to [facility] so they can resume sending her supplies.

## 2023-05-24 NOTE — Telephone Encounter (Signed)
 Copied from CRM [id number]. Topic: Medical Record Request - Provider/Facility Request >> May 21, 2023  9:50 AM Bobbye Morton wrote: Reason for CRM:  [facility] called in stating that documents were faxed over for pt incorrectly. Facility will need further notes of daily insulin regimen and frequency. Fleet Contras is requesting callback to further explain what is needed.   Callback Number [phone]  Fax Number [phone]   Reference :[phone]

## 2023-05-29 ENCOUNTER — Ambulatory Visit: Admitting: Family Medicine

## 2023-05-29 ENCOUNTER — Encounter: Payer: Self-pay | Admitting: Family Medicine

## 2023-05-29 ENCOUNTER — Other Ambulatory Visit: Payer: Medicare Other

## 2023-05-29 VITALS — BP 105/69 | HR 93 | Temp 98.2°F | Resp 16 | Ht 66.0 in | Wt 178.9 lb

## 2023-05-29 DIAGNOSIS — I10 Essential (primary) hypertension: Secondary | ICD-10-CM | POA: Diagnosis not present

## 2023-05-29 DIAGNOSIS — N3001 Acute cystitis with hematuria: Secondary | ICD-10-CM

## 2023-05-29 DIAGNOSIS — M545 Low back pain, unspecified: Secondary | ICD-10-CM | POA: Diagnosis not present

## 2023-05-29 DIAGNOSIS — N289 Disorder of kidney and ureter, unspecified: Secondary | ICD-10-CM | POA: Diagnosis not present

## 2023-05-29 DIAGNOSIS — E1151 Type 2 diabetes mellitus with diabetic peripheral angiopathy without gangrene: Secondary | ICD-10-CM | POA: Diagnosis not present

## 2023-05-29 DIAGNOSIS — E039 Hypothyroidism, unspecified: Secondary | ICD-10-CM | POA: Diagnosis not present

## 2023-05-29 DIAGNOSIS — M549 Dorsalgia, unspecified: Secondary | ICD-10-CM | POA: Diagnosis not present

## 2023-05-29 DIAGNOSIS — G8929 Other chronic pain: Secondary | ICD-10-CM

## 2023-05-29 DIAGNOSIS — Z7985 Long-term (current) use of injectable non-insulin antidiabetic drugs: Secondary | ICD-10-CM | POA: Diagnosis not present

## 2023-05-29 LAB — MICROSCOPIC EXAMINATION

## 2023-05-29 LAB — URINALYSIS, ROUTINE W REFLEX MICROSCOPIC
Bilirubin, UA: NEGATIVE
Glucose, UA: NEGATIVE
Ketones, UA: NEGATIVE
Nitrite, UA: NEGATIVE
Specific Gravity, UA: 1.02 (ref 1.005–1.030)
Urobilinogen, Ur: 0.2 mg/dL (ref 0.2–1.0)
pH, UA: 6 (ref 5.0–7.5)

## 2023-05-29 MED ORDER — METOPROLOL TARTRATE 25 MG PO TABS: 12.5000 mg | ORAL_TABLET | Freq: Two times a day (BID) | ORAL | 3 refills | Status: DC

## 2023-05-29 MED ORDER — CIPROFLOXACIN HCL 500 MG PO TABS: 500.0000 mg | ORAL_TABLET | Freq: Two times a day (BID) | ORAL | 0 refills | Status: AC

## 2023-05-29 NOTE — Assessment & Plan Note (Signed)
 Having several episodes of hypoglycemia- including while she's sleeping. Will see about continuing continuous glucose monitor. Call with any concerns.

## 2023-05-29 NOTE — Assessment & Plan Note (Signed)
 Doing much better. BP not as low. Call with any concerns.

## 2023-05-29 NOTE — Assessment & Plan Note (Signed)
 Likely due to UTI. Will treat. Call if not getting better or getting worse.

## 2023-05-29 NOTE — Progress Notes (Signed)
 BP 105/69 (BP Location: Left Arm, Patient Position: Sitting)   Pulse 93   Temp 98.2 F (36.8 C) (Oral)   Resp 16   Ht 5\' 6"  (1.676 m)   Wt 178 lb 14.4 oz (81.1 kg)   SpO2 98%   BMI 28.88 kg/m    Subjective:    Patient ID: Tammy [name], female    DOB: 07/10/1945, 78 y.o.   MRN: [id number]  HPI: Tammy [name] is a 78 y.o. female  Chief Complaint  Patient presents with   Back Pain    Been hurting for a week or two. No known injury. Not spine but both sides of low back.    Hypertension   BACK PAIN Duration: about 2 week Mechanism of injury: no trauma Location: bilateral low back R>L Onset: gradual Severity: severe Quality: drawing Frequency: intermittent Radiation: R leg Aggravating factors: moving, walking Alleviating factors: sitting, resting Status: worse Treatments attempted:  pain spray, rest, and APAP  Relief with NSAIDs?: no Nighttime pain:  yes Paresthesias / decreased sensation:  no Bowel / bladder incontinence:  no Fevers:  no Dysuria / urinary frequency:  yes  HYPERTENSION  Hypertension status: controlled  Satisfied with current treatment? yes Duration of hypertension: chronic BP monitoring frequency:  rarely BP range:  BP medication side effects:  no Medication compliance: excellent compliance Previous BP meds: metoprolol, imdur Aspirin: no Recurrent headaches: no Visual changes: no Palpitations: no Dyspnea: no Chest pain: no Lower extremity edema: no Dizzy/lightheaded: no  DIABETES Hypoglycemic episodes:yes- 4-5x in the past month, had a critical at night of 53 Polydipsia/polyuria: no Visual disturbance: no Chest pain: no Paresthesias: no Glucose Monitoring: yes Taking Insulin?: no Blood Pressure Monitoring: not checking Retinal Examination: Not up to Date Foot Exam: Not up to Date Diabetic Education: Completed Pneumovax: Up to Date Influenza: Up to Date Aspirin: no   Relevant past medical, surgical, family and social history  reviewed and updated as indicated. Interim medical history since our last visit reviewed. Allergies and medications reviewed and updated.  Review of Systems  Constitutional: Negative.   Respiratory: Negative.    Cardiovascular: Negative.   Genitourinary:  Positive for decreased urine volume, frequency and urgency. Negative for difficulty urinating, dyspareunia, dysuria, enuresis, flank pain, genital sores, hematuria, menstrual problem, pelvic pain, vaginal bleeding, vaginal discharge and vaginal pain.  Musculoskeletal:  Positive for back pain. Negative for arthralgias, gait problem, joint swelling, myalgias, neck pain and neck stiffness.  Skin: Negative.   Neurological: Negative.   Psychiatric/Behavioral: Negative.      Per HPI unless specifically indicated above     Objective:    BP 105/69 (BP Location: Left Arm, Patient Position: Sitting)   Pulse 93   Temp 98.2 F (36.8 C) (Oral)   Resp 16   Ht 5\' 6"  (1.676 m)   Wt 178 lb 14.4 oz (81.1 kg)   SpO2 98%   BMI 28.88 kg/m   Wt Readings from Last 3 Encounters:  05/29/23 178 lb 14.4 oz (81.1 kg)  04/30/23 179 lb (81.2 kg)  04/13/23 181 lb 6.4 oz (82.3 kg)    Physical Exam Vitals and nursing note reviewed.  Constitutional:      General: She is not in acute distress.    Appearance: Normal appearance. She is not ill-appearing, toxic-appearing or diaphoretic.  HENT:     Head: Normocephalic and atraumatic.     Right Ear: External ear normal.     Left Ear: External ear normal.  [name]: [name] normal.     Mouth/Throat:     Mouth: Mucous membranes are moist.     Pharynx: Oropharynx is clear.  Eyes:     General: No scleral icterus.       Right eye: No discharge.        Left eye: No discharge.     Extraocular Movements: Extraocular movements intact.     Conjunctiva/sclera: Conjunctivae normal.     Pupils: Pupils are equal, round, and reactive to light.  Cardiovascular:     Rate and Rhythm: Normal rate and regular rhythm.      Pulses: Normal pulses.     Heart sounds: Normal heart sounds. No murmur heard.    No friction rub. No gallop.  Pulmonary:     Effort: Pulmonary effort is normal. No respiratory distress.     Breath sounds: Normal breath sounds. No stridor. No wheezing, rhonchi or rales.  Chest:     Chest wall: No tenderness.  Abdominal:     General: Abdomen is flat. Bowel sounds are normal. There is no distension.     Palpations: Abdomen is soft. There is no mass.     Tenderness: There is no abdominal tenderness. There is right CVA tenderness. There is no left CVA tenderness, guarding or rebound.     Hernia: No hernia is present.  Musculoskeletal:        General: Normal range of motion.     Cervical back: Normal range of motion and neck supple.  Skin:    General: Skin is warm and dry.     Capillary Refill: Capillary refill takes less than 2 seconds.     Coloration: Skin is not jaundiced or pale.     Findings: No bruising, erythema, lesion or rash.  Neurological:     General: No focal deficit present.     Mental Status: She is alert and oriented to person, place, and time. Mental status is at baseline.  Psychiatric:        Mood and Affect: Mood normal.        Behavior: Behavior normal.        Thought Content: Thought content normal.        Judgment: Judgment normal.     Results for orders placed or performed in visit on 05/29/23  Microscopic Examination   Collection Time: 05/29/23 10:00 AM   Urine  Result Value Ref Range   WBC, UA 11-30 (A) 0 - 5 /hpf   RBC, Urine 3-10 (A) 0 - 2 /hpf   Epithelial Cells (non renal) 0-10 0 - 10 /hpf   Mucus, UA Present (A) Not Estab.   Bacteria, UA Moderate (A) None seen/Few  Urinalysis, Routine w reflex microscopic   Collection Time: 05/29/23 10:00 AM  Result Value Ref Range   Specific Gravity, UA 1.020 1.005 - 1.030   pH, UA 6.0 5.0 - 7.5   Color, UA Yellow Yellow   Appearance Ur Hazy (A) Clear   Leukocytes,UA Trace (A) Negative   Protein,UA Trace (A)  Negative/Trace   Glucose, UA Negative Negative   Ketones, UA Negative Negative   RBC, UA Trace (A) Negative   Bilirubin, UA Negative Negative   Urobilinogen, Ur 0.2 0.2 - 1.0 mg/dL   Nitrite, UA Negative Negative   Microscopic Examination See below:    *Note: Due to a large number of results and/or encounters for the requested time period, some results have not been displayed. A complete set of results can be found in  Results Review.      Assessment & Plan:   Problem List Items Addressed This Visit       Cardiovascular and Mediastinum   Essential hypertension (Chronic)   Doing much better. BP not as low. Call with any concerns.       Relevant Medications   metoprolol tartrate (LOPRESSOR) 25 MG tablet   Type 2 diabetes mellitus with diabetic peripheral angiopathy without gangrene (HCC)   Having several episodes of hypoglycemia- including while she's sleeping. Will see about continuing continuous glucose monitor. Call with any concerns.       Relevant Medications   metoprolol tartrate (LOPRESSOR) 25 MG tablet     Other   Chronic low back pain (1ry area of Pain) (Bilateral) (R>L) w/o sciatica (Chronic)   Likely due to UTI. Will treat. Call if not getting better or getting worse.       Other Visit Diagnoses       Acute cystitis with hematuria    -  Primary   Will treat with cipro. Call with any concerns or if not getting better.        Follow up plan: Return for As scheduled.

## 2023-05-30 DIAGNOSIS — M4723 Other spondylosis with radiculopathy, cervicothoracic region: Secondary | ICD-10-CM | POA: Diagnosis not present

## 2023-05-30 DIAGNOSIS — E1151 Type 2 diabetes mellitus with diabetic peripheral angiopathy without gangrene: Secondary | ICD-10-CM | POA: Diagnosis not present

## 2023-05-30 DIAGNOSIS — M47816 Spondylosis without myelopathy or radiculopathy, lumbar region: Secondary | ICD-10-CM | POA: Diagnosis not present

## 2023-05-30 DIAGNOSIS — M5136 Other intervertebral disc degeneration, lumbar region with discogenic back pain only: Secondary | ICD-10-CM | POA: Diagnosis not present

## 2023-05-30 DIAGNOSIS — I129 Hypertensive chronic kidney disease with stage 1 through stage 4 chronic kidney disease, or unspecified chronic kidney disease: Secondary | ICD-10-CM | POA: Diagnosis not present

## 2023-05-30 DIAGNOSIS — G8929 Other chronic pain: Secondary | ICD-10-CM | POA: Diagnosis not present

## 2023-05-30 LAB — BASIC METABOLIC PANEL
BUN/Creatinine Ratio: 23 (ref 12–28)
BUN: 36 mg/dL — ABNORMAL HIGH (ref 8–27)
CO2: 22 mmol/L (ref 20–29)
Calcium: 9.8 mg/dL (ref 8.7–10.3)
Chloride: 104 mmol/L (ref 96–106)
Creatinine, Ser: 1.58 mg/dL — ABNORMAL HIGH (ref 0.57–1.00)
Glucose: 94 mg/dL (ref 70–99)
Potassium: 5.3 mmol/L — ABNORMAL HIGH (ref 3.5–5.2)
Sodium: 141 mmol/L (ref 134–144)
eGFR: 33 mL/min/{1.73_m2} — ABNORMAL LOW (ref >59)

## 2023-05-30 LAB — TSH: TSH: 15.6 u[IU]/mL — ABNORMAL HIGH (ref 0.450–4.500)

## 2023-05-31 DIAGNOSIS — Z96659 Presence of unspecified artificial knee joint: Secondary | ICD-10-CM | POA: Diagnosis not present

## 2023-05-31 DIAGNOSIS — M19012 Primary osteoarthritis, left shoulder: Secondary | ICD-10-CM | POA: Diagnosis not present

## 2023-05-31 DIAGNOSIS — G8929 Other chronic pain: Secondary | ICD-10-CM | POA: Diagnosis not present

## 2023-05-31 DIAGNOSIS — M5030 Other cervical disc degeneration, unspecified cervical region: Secondary | ICD-10-CM | POA: Diagnosis not present

## 2023-05-31 DIAGNOSIS — E1151 Type 2 diabetes mellitus with diabetic peripheral angiopathy without gangrene: Secondary | ICD-10-CM | POA: Diagnosis not present

## 2023-05-31 DIAGNOSIS — E782 Mixed hyperlipidemia: Secondary | ICD-10-CM | POA: Diagnosis not present

## 2023-05-31 DIAGNOSIS — D631 Anemia in chronic kidney disease: Secondary | ICD-10-CM | POA: Diagnosis not present

## 2023-05-31 DIAGNOSIS — I2699 Other pulmonary embolism without acute cor pulmonale: Secondary | ICD-10-CM | POA: Diagnosis not present

## 2023-05-31 DIAGNOSIS — I70211 Atherosclerosis of native arteries of extremities with intermittent claudication, right leg: Secondary | ICD-10-CM | POA: Diagnosis not present

## 2023-05-31 DIAGNOSIS — E1122 Type 2 diabetes mellitus with diabetic chronic kidney disease: Secondary | ICD-10-CM | POA: Diagnosis not present

## 2023-05-31 DIAGNOSIS — M4723 Other spondylosis with radiculopathy, cervicothoracic region: Secondary | ICD-10-CM | POA: Diagnosis not present

## 2023-05-31 DIAGNOSIS — J4489 Other specified chronic obstructive pulmonary disease: Secondary | ICD-10-CM | POA: Diagnosis not present

## 2023-05-31 DIAGNOSIS — M4802 Spinal stenosis, cervical region: Secondary | ICD-10-CM | POA: Diagnosis not present

## 2023-05-31 DIAGNOSIS — E039 Hypothyroidism, unspecified: Secondary | ICD-10-CM | POA: Diagnosis not present

## 2023-05-31 DIAGNOSIS — I700 Atherosclerosis of aorta: Secondary | ICD-10-CM | POA: Diagnosis not present

## 2023-05-31 DIAGNOSIS — I129 Hypertensive chronic kidney disease with stage 1 through stage 4 chronic kidney disease, or unspecified chronic kidney disease: Secondary | ICD-10-CM | POA: Diagnosis not present

## 2023-05-31 DIAGNOSIS — J439 Emphysema, unspecified: Secondary | ICD-10-CM | POA: Diagnosis not present

## 2023-05-31 DIAGNOSIS — M5136 Other intervertebral disc degeneration, lumbar region with discogenic back pain only: Secondary | ICD-10-CM | POA: Diagnosis not present

## 2023-05-31 DIAGNOSIS — Z9181 History of falling: Secondary | ICD-10-CM | POA: Diagnosis not present

## 2023-05-31 DIAGNOSIS — N1832 Chronic kidney disease, stage 3b: Secondary | ICD-10-CM | POA: Diagnosis not present

## 2023-05-31 DIAGNOSIS — I2510 Atherosclerotic heart disease of native coronary artery without angina pectoris: Secondary | ICD-10-CM | POA: Diagnosis not present

## 2023-05-31 DIAGNOSIS — M47816 Spondylosis without myelopathy or radiculopathy, lumbar region: Secondary | ICD-10-CM | POA: Diagnosis not present

## 2023-05-31 DIAGNOSIS — I6523 Occlusion and stenosis of bilateral carotid arteries: Secondary | ICD-10-CM | POA: Diagnosis not present

## 2023-05-31 DIAGNOSIS — M170 Bilateral primary osteoarthritis of knee: Secondary | ICD-10-CM | POA: Diagnosis not present

## 2023-05-31 LAB — URINE CULTURE

## 2023-06-01 ENCOUNTER — Other Ambulatory Visit: Payer: Self-pay | Admitting: Family Medicine

## 2023-06-01 ENCOUNTER — Encounter: Payer: Self-pay | Admitting: Family Medicine

## 2023-06-01 MED ORDER — LEVOTHYROXINE SODIUM 75 MCG PO TABS: 75.0000 ug | ORAL_TABLET | Freq: Every day | ORAL | 0 refills | Status: DC

## 2023-06-02 ENCOUNTER — Other Ambulatory Visit: Payer: Self-pay | Admitting: Family Medicine

## 2023-06-04 ENCOUNTER — Other Ambulatory Visit: Payer: Self-pay | Admitting: Family Medicine

## 2023-06-04 MED ORDER — LEVOTHYROXINE SODIUM 100 MCG PO TABS: 100.0000 ug | ORAL_TABLET | Freq: Every day | ORAL | 1 refills | Status: DC

## 2023-06-04 NOTE — Telephone Encounter (Signed)
 Requested Prescriptions  Pending Prescriptions Disp Refills   gabapentin (NEURONTIN) 300 MG capsule [Pharmacy Med Name: GABAPENTIN 300 MG CAP] 540 capsule 1    Sig: TAKE 2 CAPULES BY MOUTH THREE TIMES DAILY     Neurology: Anticonvulsants - gabapentin Failed - 06/04/2023  3:06 PM      Failed - Cr in normal range and within 360 days    Creatinine  Date Value Ref Range Status  04/30/2023 1.77 (H) 0.44 - 1.00 mg/dL Final  16/12/9602 5.40 (H) 0.60 - 1.30 mg/dL Final   Creatinine, Ser  Date Value Ref Range Status  05/29/2023 1.58 (H) 0.57 - 1.00 mg/dL Final         Passed - Completed PHQ-2 or PHQ-9 in the last 360 days      Passed - Valid encounter within last 12 months    Recent Outpatient Visits           1 month ago Essential hypertension   [facility] [place], Megan P, DO   5 months ago Acute cystitis without hematuria   [facility] [place], Megan P, DO   5 months ago Acute cystitis without hematuria   [place] [facility] [place], Megan P, DO   6 months ago Type 2 diabetes mellitus with diabetic peripheral angiopathy without gangrene, without long-term current use of insulin (HCC)   [place] [facility] [place], Megan P, DO   9 months ago Essential hypertension   [place] [facility] [place], [place], DO       Future Appointments             In 1 month Johnson, Oralia Rud, DO [facility], [facility]

## 2023-06-06 ENCOUNTER — Other Ambulatory Visit: Payer: Self-pay | Admitting: Family Medicine

## 2023-06-06 NOTE — Telephone Encounter (Signed)
 Duplicate request, last refilled 06/04/23.  Requested Prescriptions  Pending Prescriptions Disp Refills   gabapentin (NEURONTIN) 300 MG capsule [Pharmacy Med Name: GABAPENTIN 300 MG CAP] 540 capsule 1    Sig: TAKE 2 CAPULES BY MOUTH THREE TIMES DAILY     Neurology: Anticonvulsants - gabapentin Failed - 06/06/2023 10:29 AM      Failed - Cr in normal range and within 360 days    Creatinine  Date Value Ref Range Status  04/30/2023 1.77 (H) 0.44 - 1.00 mg/dL Final  16/12/9602 5.40 (H) 0.60 - 1.30 mg/dL Final   Creatinine, Ser  Date Value Ref Range Status  05/29/2023 1.58 (H) 0.57 - 1.00 mg/dL Final         Passed - Completed PHQ-2 or PHQ-9 in the last 360 days      Passed - Valid encounter within last 12 months    Recent Outpatient Visits           1 month ago Essential hypertension   [facility] [place], Megan P, DO   5 months ago Acute cystitis without hematuria   [place] [facility] [place], Megan P, DO   5 months ago Acute cystitis without hematuria   [facility] [place], Megan P, DO   6 months ago Type 2 diabetes mellitus with diabetic peripheral angiopathy without gangrene, without long-term current use of insulin (HCC)   [place] [facility] [place], Megan P, DO   9 months ago Essential hypertension   [place] [facility] [place], [place], DO       Future Appointments             In 1 month Johnson, Oralia Rud, DO [facility], [facility]

## 2023-06-08 NOTE — Telephone Encounter (Signed)
 Rx 04/15/23 #90-too soon Requested Prescriptions  Pending Prescriptions Disp Refills   montelukast (SINGULAIR) 10 MG tablet [Pharmacy Med Name: MONTELUKAST SODIUM 10 MG TAB] 90 tablet 0    Sig: TAKE 1 TABLET BY MOUTH AT BEDTIME     Pulmonology:  Leukotriene Inhibitors Failed - 06/08/2023  9:18 AM      Failed - Valid encounter within last 12 months    Recent Outpatient Visits           1 week ago Acute cystitis with hematuria   [facility] Dorcas Carrow, DO       Future Appointments             In 1 month Laural Benes, Oralia Rud, DO [facility], [facility]

## 2023-06-13 ENCOUNTER — Telehealth: Payer: Self-pay

## 2023-06-13 NOTE — Telephone Encounter (Signed)
 Copied from CRM [id number]. Topic: Clinical - Medication Question >> Jun 11, 2023  2:37 PM Tammy [name] T wrote: Reason for CRM: patient called stated she recvd a letter from [facility] [phone] requesting documentation for coverage on her CGM supplies. Patient stated her sensor will give out on Friday. Please f/u with [place].

## 2023-06-13 NOTE — Telephone Encounter (Signed)
 Called and spoke with [facility]. Per the patient's insurance, they will no longer cover CGM supplies unless the patient is injecting insulin at least once per day. Is there anything we can do for the patient?

## 2023-06-14 NOTE — Telephone Encounter (Signed)
 Called and notified patient of Dr. Henriette Combs message. Patient states she will discuss it more with Dr. Laural Benes next week at her appointment.

## 2023-06-14 NOTE — Telephone Encounter (Signed)
 If that is their rule, no, there is not. She has had episodes of hypoglycemia so we thought it was covered but she is not on insulin- so if they require that, we cannot get it covered.

## 2023-06-19 ENCOUNTER — Ambulatory Visit (INDEPENDENT_AMBULATORY_CARE_PROVIDER_SITE_OTHER): Admitting: Family Medicine

## 2023-06-19 ENCOUNTER — Encounter: Payer: Self-pay | Admitting: Family Medicine

## 2023-06-19 VITALS — BP 111/66 | HR 71 | Temp 97.4°F | Ht 66.0 in | Wt 178.0 lb

## 2023-06-19 DIAGNOSIS — E162 Hypoglycemia, unspecified: Secondary | ICD-10-CM | POA: Diagnosis not present

## 2023-06-19 DIAGNOSIS — Z7985 Long-term (current) use of injectable non-insulin antidiabetic drugs: Secondary | ICD-10-CM | POA: Diagnosis not present

## 2023-06-19 DIAGNOSIS — E1151 Type 2 diabetes mellitus with diabetic peripheral angiopathy without gangrene: Secondary | ICD-10-CM | POA: Diagnosis not present

## 2023-06-19 NOTE — Assessment & Plan Note (Signed)
 A1c doing well last check at 6.5. Tolerating her mounjaro well. No longer on insulin. Wants to continue with CGM- will get her hooked up with pharmacy to see if they can help. Long discussion with patient today about strict rules regarding her insurance. Follow up in May as scheduled.

## 2023-06-19 NOTE — Progress Notes (Signed)
 BP 111/66 (BP Location: Right Arm, Patient Position: Sitting, Cuff Size: Normal)   Pulse 71   Temp (!) 97.4 F (36.3 C) (Oral)   Ht 5\' 6"  (1.676 m)   Wt 178 lb (80.7 kg)   SpO2 95%   BMI 28.73 kg/m    Subjective:    Patient ID: Tammy [name], female    DOB: Jul 11, 1945, 78 y.o.   MRN: [id number]  HPI: Tammy [name] is a 78 y.o. female  Chief Complaint  Patient presents with   Diabetes   DIABETES Hypoglycemic episodes:yes- sugars dropping into the 50s without warning Polydipsia/polyuria: no Visual disturbance: no Chest pain: no Paresthesias: no Glucose Monitoring: no  Accucheck frequency: was on cgm, but no longer covered- would like to go back on one.  Taking Insulin?: no Blood Pressure Monitoring: not checking Retinal Examination: Up to Date Foot Exam: Not up to Date Diabetic Education: Completed Pneumovax: Up to Date Influenza: Up to Date Aspirin: no   Relevant past medical, surgical, family and social history reviewed and updated as indicated. Interim medical history since our last visit reviewed. Allergies and medications reviewed and updated.  Review of Systems  Constitutional: Negative.   Respiratory: Negative.    Cardiovascular: Negative.   Musculoskeletal: Negative.   Neurological: Negative.   Psychiatric/Behavioral: Negative.      Per HPI unless specifically indicated above     Objective:    BP 111/66 (BP Location: Right Arm, Patient Position: Sitting, Cuff Size: Normal)   Pulse 71   Temp (!) 97.4 F (36.3 C) (Oral)   Ht 5\' 6"  (1.676 m)   Wt 178 lb (80.7 kg)   SpO2 95%   BMI 28.73 kg/m   Wt Readings from Last 3 Encounters:  06/19/23 178 lb (80.7 kg)  05/29/23 178 lb 14.4 oz (81.1 kg)  04/30/23 179 lb (81.2 kg)    Physical Exam Vitals and nursing note reviewed.  Constitutional:      General: She is not in acute distress.    Appearance: Normal appearance. She is not ill-appearing, toxic-appearing or diaphoretic.     Comments: Wheelchair  bound  HENT:     Head: Normocephalic and atraumatic.     Right Ear: External ear normal.     Left Ear: External ear normal.     [name]: [name] normal.     Mouth/Throat:     Mouth: Mucous membranes are moist.     Pharynx: Oropharynx is clear.  Eyes:     General: No scleral icterus.       Right eye: No discharge.        Left eye: No discharge.     Extraocular Movements: Extraocular movements intact.     Conjunctiva/sclera: Conjunctivae normal.     Pupils: Pupils are equal, round, and reactive to light.  Cardiovascular:     Rate and Rhythm: Normal rate and regular rhythm.     Pulses: Normal pulses.     Heart sounds: Normal heart sounds. No murmur heard.    No friction rub. No gallop.  Pulmonary:     Effort: Pulmonary effort is normal. No respiratory distress.     Breath sounds: Normal breath sounds. No stridor. No wheezing, rhonchi or rales.  Chest:     Chest wall: No tenderness.  Musculoskeletal:        General: Normal range of motion.     Cervical back: Normal range of motion and neck supple.  Skin:    General: Skin is warm and  dry.     Capillary Refill: Capillary refill takes less than 2 seconds.     Coloration: Skin is not jaundiced or pale.     Findings: No bruising, erythema, lesion or rash.  Neurological:     General: No focal deficit present.     Mental Status: She is alert and oriented to person, place, and time. Mental status is at baseline.  Psychiatric:        Mood and Affect: Mood normal.        Behavior: Behavior normal.        Thought Content: Thought content normal.        Judgment: Judgment normal.     Results for orders placed or performed in visit on 05/29/23  Basic metabolic panel   Collection Time: 05/29/23  9:16 AM  Result Value Ref Range   Glucose 94 70 - 99 mg/dL   BUN 36 (H) 8 - 27 mg/dL   Creatinine, Ser 0.98 (H) 0.57 - 1.00 mg/dL   eGFR 33 (L) >11 BJ/YNW/2.95   BUN/Creatinine Ratio 23 12 - 28   Sodium 141 134 - 144 mmol/L   Potassium 5.3 (H)  3.5 - 5.2 mmol/L   Chloride 104 96 - 106 mmol/L   CO2 22 20 - 29 mmol/L   Calcium 9.8 8.7 - 10.3 mg/dL  TSH   Collection Time: 05/29/23  9:16 AM  Result Value Ref Range   TSH 15.600 (H) 0.450 - 4.500 uIU/mL  Urine Culture   Collection Time: 05/29/23 10:00 AM   Specimen: Urine   UR  Result Value Ref Range   Urine Culture, Routine Final report    Organism ID, Bacteria Comment   Microscopic Examination   Collection Time: 05/29/23 10:00 AM   Urine  Result Value Ref Range   WBC, UA 11-30 (A) 0 - 5 /hpf   RBC, Urine 3-10 (A) 0 - 2 /hpf   Epithelial Cells (non renal) 0-10 0 - 10 /hpf   Mucus, UA Present (A) Not Estab.   Bacteria, UA Moderate (A) None seen/Few  Urinalysis, Routine w reflex microscopic   Collection Time: 05/29/23 10:00 AM  Result Value Ref Range   Specific Gravity, UA 1.020 1.005 - 1.030   pH, UA 6.0 5.0 - 7.5   Color, UA Yellow Yellow   Appearance Ur Hazy (A) Clear   Leukocytes,UA Trace (A) Negative   Protein,UA Trace (A) Negative/Trace   Glucose, UA Negative Negative   Ketones, UA Negative Negative   RBC, UA Trace (A) Negative   Bilirubin, UA Negative Negative   Urobilinogen, Ur 0.2 0.2 - 1.0 mg/dL   Nitrite, UA Negative Negative   Microscopic Examination See below:    *Note: Due to a large number of results and/or encounters for the requested time period, some results have not been displayed. A complete set of results can be found in Results Review.      Assessment & Plan:   Problem List Items Addressed This Visit       Cardiovascular and Mediastinum   Type 2 diabetes mellitus with diabetic peripheral angiopathy without gangrene (HCC)   A1c doing well last check at 6.5. Tolerating her mounjaro well. No longer on insulin. Wants to continue with CGM- will get her hooked up with pharmacy to see if they can help. Long discussion with patient today about strict rules regarding her insurance. Follow up in May as scheduled.       Relevant Orders   AMB  Referral VBCI  Care Management   Other Visit Diagnoses       Hypoglycemia    -  Primary   Relevant Orders   AMB Referral VBCI Care Management        Follow up plan: Return for As scheduled.

## 2023-06-21 ENCOUNTER — Telehealth: Payer: Self-pay

## 2023-06-21 NOTE — Progress Notes (Signed)
 Care Guide Pharmacy Note  06/21/2023 Name: Tammy [name] MRN: [id number] DOB: 1945-11-10  Referred By: Dorcas Carrow, DO Reason for referral: Complex Care Management (Outreach to schedule with pharm d )   Tammy [name] is a 78 y.o. year old female who is a primary care patient of Dorcas Carrow, DO.  Tammy [name] was referred to the pharmacist for assistance related to: DMII  Successful contact was made with the patient to discuss pharmacy services including being ready for the pharmacist to call at least 5 minutes before the scheduled appointment time and to have medication bottles and any blood pressure readings ready for review. The patient agreed to meet with the pharmacist via telephone visit on (date/time).07/06/2023  Penne Lash , RMA     [place]  [facility], [place] [facility] Guide  Direct Dial: [phone]  Website: [email or web address]

## 2023-06-27 ENCOUNTER — Other Ambulatory Visit: Payer: Self-pay | Admitting: Family Medicine

## 2023-06-28 DIAGNOSIS — M47816 Spondylosis without myelopathy or radiculopathy, lumbar region: Secondary | ICD-10-CM | POA: Diagnosis not present

## 2023-06-28 DIAGNOSIS — M4723 Other spondylosis with radiculopathy, cervicothoracic region: Secondary | ICD-10-CM | POA: Diagnosis not present

## 2023-06-28 DIAGNOSIS — E1151 Type 2 diabetes mellitus with diabetic peripheral angiopathy without gangrene: Secondary | ICD-10-CM | POA: Diagnosis not present

## 2023-06-28 DIAGNOSIS — M5136 Other intervertebral disc degeneration, lumbar region with discogenic back pain only: Secondary | ICD-10-CM | POA: Diagnosis not present

## 2023-06-28 DIAGNOSIS — G8929 Other chronic pain: Secondary | ICD-10-CM | POA: Diagnosis not present

## 2023-06-28 DIAGNOSIS — I129 Hypertensive chronic kidney disease with stage 1 through stage 4 chronic kidney disease, or unspecified chronic kidney disease: Secondary | ICD-10-CM | POA: Diagnosis not present

## 2023-06-28 NOTE — Telephone Encounter (Signed)
 Requested medication (s) are due for refill today - unknown  Requested medication (s) are on the active medication list -no  Future visit scheduled -yes  Last refill: unknown  Notes to clinic: medication not listed on current medication list, off protocol- provider review   Requested Prescriptions  Pending Prescriptions Disp Refills   trimethoprim (TRIMPEX) 100 MG tablet [Pharmacy Med Name: TRIMETHOPRIM 100 MG TAB] 90 tablet     Sig: TAKE ONE TABLET BY MOUTH ONCE DAILY     Off-Protocol Failed - 06/28/2023  8:50 AM      Failed - Medication not assigned to a protocol, review manually.      Failed - Valid encounter within last 12 months    Recent Outpatient Visits           1 week ago Hypoglycemia   [place] [facility] Baker, Megan P, DO   1 month ago Acute cystitis with hematuria   [place] [facility] Solomon Dupre, DO       Future Appointments             In 2 weeks [place] Renshaw, Jerilee Montane, DO [place] [facility]               Requested Prescriptions  Pending Prescriptions Disp Refills   trimethoprim (TRIMPEX) 100 MG tablet [Pharmacy Med Name: TRIMETHOPRIM 100 MG TAB] 90 tablet     Sig: TAKE ONE TABLET BY MOUTH ONCE DAILY     Off-Protocol Failed - 06/28/2023  8:50 AM      Failed - Medication not assigned to a protocol, review manually.      Failed - Valid encounter within last 12 months    Recent Outpatient Visits           1 week ago Hypoglycemia   [place] [facility] F[place], Megan P, DO   1 month ago Acute cystitis with hematuria    [facility] Solomon Dupre, DO       Future Appointments             In 2 weeks Lincoln Renshaw, Jerilee Montane, DO  [facility]

## 2023-07-02 ENCOUNTER — Other Ambulatory Visit: Payer: Self-pay | Admitting: Family Medicine

## 2023-07-03 NOTE — Telephone Encounter (Signed)
 Requested medication (s) are due for refill today: Yes  Requested medication (s) are on the active medication list: No  Last refill:  03/01/23  Future visit scheduled: Yes  Notes to clinic:  Medication not on current medication list      Requested Prescriptions  Pending Prescriptions Disp Refills   glipiZIDE  (GLUCOTROL ) 5 MG tablet [Pharmacy Med Name: GLIPIZIDE  5 MG TAB] 90 tablet     Sig: TAKE 1 TABLET BY MOUTH ONCE DAILY BEFOREBREAKFAST     Endocrinology:  Diabetes - Sulfonylureas Failed - 07/03/2023 12:52 PM      Failed - Cr in normal range and within 360 days    Creatinine  Date Value Ref Range Status  04/30/2023 1.77 (H) 0.44 - 1.00 mg/dL Final  69/62/9528 4.13 (H) 0.60 - 1.30 mg/dL Final   Creatinine, Ser  Date Value Ref Range Status  05/29/2023 1.58 (H) 0.57 - 1.00 mg/dL Final         Failed - Valid encounter within last 6 months    Recent Outpatient Visits           2 weeks ago Hypoglycemia   [place] [facility] [place], Megan P, DO   1 month ago Acute cystitis with hematuria   [place] [facility] [place], Jerilee Montane, DO       Future Appointments             In 1 week Solomon Dupre, DO [place] [facility]            Passed - HBA1C is between 0 and 7.9 and within 180 days    Hemoglobin A1C  Date Value Ref Range Status  11/23/2021 7.5  Final   HB A1C (BAYER DCA - WAIVED)  Date Value Ref Range Status  04/13/2023 6.5 (H) 4.8 - 5.6 % Final    Comment:             Prediabetes: 5.7 - 6.4          Diabetes: >6.4          Glycemic control for adults with diabetes: <7.0

## 2023-07-06 ENCOUNTER — Other Ambulatory Visit: Payer: Self-pay

## 2023-07-06 NOTE — Progress Notes (Signed)
 07/06/2023 Name: Tammy [name] MRN: [id number] DOB: Oct 31, 1945  Chief Complaint  Patient presents with   Medication Assistance   Tammy [name] is a 78 y.o. year old female who presented for a telephone visit.   They were referred to the pharmacist by their PCP for assistance in managing medication access.    Subjective:  Care Team: Primary Care Provider: Solomon Dupre, DO ; Next Scheduled Visit: 5/1  Medication Access/Adherence  Current Pharmacy:  [facility] - [place], [place] - [address] [place] [address] Phone: [phone] Fax: [phone]  [facility] - [place], [place] - [address] [place] [address] Phone: [phone] Fax: [phone]  [id number] [place], [place] - [address] [place] [address] Phone: [phone] Fax: [phone]  [facility] [address] [place] [address] Phone: [phone] Fax: [phone]  -Patient reports affordability concerns with their medications: Yes  -Patient reports access/transportation concerns to their pharmacy: No  -Patient reports adherence concerns with their medications:  Yes    Diabetes: Current medications: Mounjaro  12.5mg  weekly -Patient previously on insulin , but this was stopped due to hypoglycemia -Insurance (Medicare) will no longer cover Libre 2 CGM since patient is not using insulin  -Previously receiving sensors through [place]  -Patient has experienced numerous instances of hypoglycemia; these occur weekly.  BG will drop into the 50's, with several readings reported of 52 and 53.   Objective:  Lab Results  Component Value Date   HGBA1C 6.5 (H) 04/13/2023   Lab Results  Component Value Date   CREATININE 1.58 (H) 05/29/2023   BUN 36 (H) 05/29/2023   NA 141 05/29/2023   K 5.3 (H) 05/29/2023   CL 104 05/29/2023   CO2 22 05/29/2023    Medications Reviewed Today     Reviewed by Linn Rich, RPH (Pharmacist) on 07/06/23 at [phone]  Med List Status: <None>   Medication Order Taking? Sig Documenting Provider Last Dose Status Informant  acetaminophen  (TYLENOL ) 500 MG tablet [id number]  Take 500 mg by mouth every 6 (six) hours as needed for moderate pain. Takes up to 3 tablets/day PRN for increased pain [provider]  Active   albuterol  (VENTOLIN  HFA) 108 (90 Base) MCG/ACT inhaler [id number]  Inhale 2 puffs into the lungs every 6 (six) hours as needed for wheezing or shortness of breath. Johnson, Megan P, DO  Active   anastrozole  (ARIMIDEX ) 1 MG tablet [id number]  Take 1 tablet (1 mg total) by mouth daily. Brahmanday, Govinda R, MD  Active   apixaban  (ELIQUIS ) 2.5 MG TABS tablet [id number]  Take 1 tablet (2.5 mg total) by mouth 2 (two) times daily. Johnson, Megan P, DO  Active   Calcium  Carb-Cholecalciferol  (CALCIUM  600 + D) 600-200 MG-UNIT TABS [id number]  Take 1 tablet by mouth daily.  [provider]  Active Other, Pharmacy Records, Multiple Informants  cephALEXin  (KEFLEX ) 500 MG capsule [id number]  Take 500 mg by mouth daily. [provider]  Active   cetirizine  (ZYRTEC ) 10 MG tablet [id number]  TAKE 1 TABLET BY MOUTH DAILY Johnson, Megan P, DO  Active   cholecalciferol  (VITAMIN D3) 25 MCG (1000 UT) tablet [id number]  Take 1,000 Units by mouth daily. [provider]  Active Other, Pharmacy Records, Multiple Informants  Continuous Glucose Receiver (FREESTYLE LIBRE 2 READER) DEVI [id number] No As directed  Patient not taking: Reported  on 07/06/2023   Solomon Dupre, DO Not Taking Active   Continuous Glucose Sensor (FREESTYLE LIBRE 2 SENSOR) MISC [id number] No 1 each by Does not apply route every 14 (fourteen) days.  Patient not taking: Reported on 07/06/2023   Solomon Dupre, DO Not Taking Active   CRANBERRY PO [id number]  Take 2 capsules by mouth daily. [provider]  Active Other,  Pharmacy Records, Multiple Informants  DULoxetine  (CYMBALTA ) 60 MG capsule [id number]  TAKE 2 CAPSULES BY MOUTH ONCE DAILY Johnson, Megan P, DO  Active   fluticasone  (FLONASE ) 50 MCG/ACT nasal spray [id number]  Place 1 spray into both nostrils daily. Wilder Handy, MD  Active Multiple Informants, Pharmacy Records, Other  Fluticasone -Umeclidin-Vilant (TRELEGY ELLIPTA ) 100-62.5-25 MCG/ACT AEPB [id number]  Inhale 1 puff into the lungs daily. Lincoln Renshaw, Megan P, DO  Active   gabapentin  (NEURONTIN ) 300 MG capsule [id number]  TAKE 2 CAPULES BY MOUTH THREE TIMES DAILY Johnson, Megan P, DO  Active   isosorbide  mononitrate (IMDUR ) 30 MG 24 hr tablet [id number]  Take 1 tablet (30 mg total) by mouth at bedtime. Terre Ferri P, DO  Active   levothyroxine  (SYNTHROID ) 100 MCG tablet [id number]  Take 1 tablet (100 mcg total) by mouth daily before breakfast. Terre Ferri P, DO  Active   metoprolol  tartrate (LOPRESSOR ) 25 MG tablet [id number]  Take 0.5 tablets (12.5 mg total) by mouth 2 (two) times daily. Johnson, Megan P, DO  Active   montelukast  (SINGULAIR ) 10 MG tablet [id number]  Take 1 tablet (10 mg total) by mouth at bedtime. Terre Ferri P, DO  Active   MYRBETRIQ  50 MG TB24 tablet [id number]  TAKE 1 TABLET BY MOUTH DAILY Vaillancourt, Samantha, PA-C  Active Other, Pharmacy Records, Multiple Informants  nitroGLYCERIN  (NITROSTAT ) 0.4 MG SL tablet [id number]  Place 1 tablet (0.4 mg total) under the tongue every 5 (five) minutes as needed for chest pain. Total of 3 doses Gollan, Timothy J, MD  Active Multiple Informants, Pharmacy Records, Other  ondansetron  (ZOFRAN ) 4 MG tablet [id number]  Take 1 tablet (4 mg total) by mouth every 8 (eight) hours as needed for nausea or vomiting. Terre Ferri P, DO  Active   pantoprazole  (PROTONIX ) 40 MG tablet [id number]  TAKE 1 TABLET BY MOUTH ONCE DAILY Johnson, Megan P, DO  Active   Pumpkin Seed-Soy Germ (AZO BLADDER CONTROL/GO-LESS PO) [id number]  Take 1 tablet by mouth in the morning  and at bedtime. [provider]  Active Other, Pharmacy Records, Multiple Informants  rosuvastatin  (CRESTOR ) 40 MG tablet [id number]  Take 1 tablet (40 mg total) by mouth daily. Johnson, Megan P, DO  Active   tirzepatide  (MOUNJARO ) 12.5 MG/0.5ML Pen [id number] Yes Inject 12.5 mg into the skin once a week. Terre Ferri P, DO Taking Active   ZINC  CITRATE PO [id number]  Take 1 tablet by mouth daily. [provider]  Active Other, Pharmacy Records, Multiple Informants  Med List Note Huston Maiers, RN 06/29/22 1011): UDS 06/28/22 06/29/22 - Clearance approval from Dr. Gollan to stop Eliquis  for 3 days             Assessment/Plan:   Diabetes: -Currently controlled -Contacted [facility], and they are unable to assist in submitting documentation to Medicare B to attempt to get CGM coverage -Submitted order for [facility] 3+ Reader and Sensors [employer] 2 will no longer be available in near future)in [employer] to [employer], along with documentation of hypoglycemia, and patient's insurance information  Follow Up Plan: Will monitor progress of  CGM processing and keep patient/provider informed  Linn Rich, PharmD, DPLA

## 2023-07-12 ENCOUNTER — Ambulatory Visit: Payer: Medicare Other | Admitting: Family Medicine

## 2023-07-12 ENCOUNTER — Encounter: Payer: Self-pay | Admitting: Family Medicine

## 2023-07-12 ENCOUNTER — Other Ambulatory Visit: Payer: Self-pay

## 2023-07-12 ENCOUNTER — Telehealth: Payer: Self-pay

## 2023-07-12 VITALS — BP 97/64 | HR 73 | Ht 66.0 in | Wt 173.4 lb

## 2023-07-12 DIAGNOSIS — I10 Essential (primary) hypertension: Secondary | ICD-10-CM

## 2023-07-12 DIAGNOSIS — E1151 Type 2 diabetes mellitus with diabetic peripheral angiopathy without gangrene: Secondary | ICD-10-CM | POA: Diagnosis not present

## 2023-07-12 DIAGNOSIS — Z7985 Long-term (current) use of injectable non-insulin antidiabetic drugs: Secondary | ICD-10-CM

## 2023-07-12 LAB — BAYER DCA HB A1C WAIVED: HB A1C (BAYER DCA - WAIVED): 5.8 % — ABNORMAL HIGH (ref 4.8–5.6)

## 2023-07-12 MED ORDER — FLUTICASONE PROPIONATE 50 MCG/ACT NA SUSP: 1.0000 | Freq: Every day | NASAL | 2 refills | Status: AC

## 2023-07-12 MED ORDER — ISOSORBIDE MONONITRATE ER 30 MG PO TB24: 15.0000 mg | ORAL_TABLET | Freq: Every day | ORAL | Status: DC

## 2023-07-12 MED ORDER — MOUNJARO 12.5 MG/0.5ML ~~LOC~~ SOAJ: 12.5000 mg | SUBCUTANEOUS | 1 refills | Status: DC

## 2023-07-12 NOTE — Assessment & Plan Note (Signed)
 Overtreated. Will cut her imdur  in 1/2 and recheck in 6 weeks. Call with any concerns.

## 2023-07-12 NOTE — Progress Notes (Signed)
   07/12/2023  Patient ID: Tammy [name], female   DOB: September 08, 1945, 78 y.o.   MRN: [id number]  [facility] 3 Reader and Libre 3+ Sensor order has been fulfilled by [employer] (submitted through [facility]).  Insurance approved based on documented episodes of hypoglycemia, and supplies will arrive at patient's home Monday 5/5.  Contacted patient to make her aware and inform her this would be for the updated reader and sensors- she was previously using Libre 2.  Patient voiced understanding and will reach out if she needs assistance with Libre 3+.  Linn Rich, PharmD, DPLA

## 2023-07-12 NOTE — Assessment & Plan Note (Signed)
 Doing great with A1c of 5.8. Continue current regimen. Continue to monitor. Recheck 3 months. Call with any concerns.

## 2023-07-12 NOTE — Progress Notes (Signed)
 BP 97/64 (BP Location: Right Arm, Patient Position: Sitting)   Pulse 73   Ht 5\' 6"  (1.676 m)   Wt 173 lb 6.4 oz (78.7 kg)   SpO2 93%   BMI 27.99 kg/m    Subjective:    Patient ID: Tammy [name], female    DOB: [birth year], 78 y.o.   MRN: [id number]  HPI: Tammy [name] is a 78 y.o. female  Chief Complaint  Patient presents with   Diabetes   DIABETES Hypoglycemic episodes:yes- several times a week Polydipsia/polyuria: no Visual disturbance: no Chest pain: no Paresthesias: yes Glucose Monitoring: yes Taking Insulin ?: no Blood Pressure Monitoring: not checking Retinal Examination: Up to Date Foot Exam: Up to Date Diabetic Education: Completed Pneumovax: Up to Date Influenza: Up to Date Aspirin : no  HYPERTENSION  Hypertension status: overtreated  Satisfied with current treatment? yes Duration of hypertension: chronic BP monitoring frequency:  rarely BP medication side effects:  no Medication compliance: good compliance Previous BP meds:imdur , metoprolol  Aspirin : no Recurrent headaches: no Visual changes: no Palpitations: no Dyspnea: no Chest pain: no Lower extremity edema: no Dizzy/lightheaded: yes   Relevant past medical, surgical, family and social history reviewed and updated as indicated. Interim medical history since our last visit reviewed. Allergies and medications reviewed and updated.  Review of Systems  Constitutional: Negative.   Respiratory: Negative.    Cardiovascular: Negative.   Musculoskeletal: Negative.   Neurological: Negative.   Psychiatric/Behavioral: Negative.      Per HPI unless specifically indicated above     Objective:    BP 97/64 (BP Location: Right Arm, Patient Position: Sitting)   Pulse 73   Ht 5\' 6"  (1.676 m)   Wt 173 lb 6.4 oz (78.7 kg)   SpO2 93%   BMI 27.99 kg/m   Wt Readings from Last 3 Encounters:  07/12/23 173 lb 6.4 oz (78.7 kg)  06/19/23 178 lb (80.7 kg)  05/29/23 178 lb 14.4 oz (81.1 kg)    Physical  Exam Vitals and nursing note reviewed.  Constitutional:      General: She is not in acute distress.    Appearance: Normal appearance. She is not ill-appearing, toxic-appearing or diaphoretic.  HENT:     Head: Normocephalic and atraumatic.     Right Ear: External ear normal.     Left Ear: External ear normal.     Nose: Nose normal.     Mouth/Throat:     Mouth: Mucous membranes are moist.     Pharynx: Oropharynx is clear.  Eyes:     General: No scleral icterus.       Right eye: No discharge.        Left eye: No discharge.     Extraocular Movements: Extraocular movements intact.     Conjunctiva/sclera: Conjunctivae normal.     Pupils: Pupils are equal, round, and reactive to light.  Cardiovascular:     Rate and Rhythm: Normal rate and regular rhythm.     Pulses: Normal pulses.     Heart sounds: Normal heart sounds. No murmur heard.    No friction rub. No gallop.  Pulmonary:     Effort: Pulmonary effort is normal. No respiratory distress.     Breath sounds: Normal breath sounds. No stridor. No wheezing, rhonchi or rales.  Chest:     Chest wall: No tenderness.  Musculoskeletal:        General: Normal range of motion.     Cervical back: Normal range of motion and neck supple.  Skin:    General: Skin is warm and dry.     Capillary Refill: Capillary refill takes less than 2 seconds.     Coloration: Skin is not jaundiced or pale.     Findings: No bruising, erythema, lesion or rash.  Neurological:     General: No focal deficit present.     Mental Status: She is alert and oriented to person, place, and time. Mental status is at baseline.  Psychiatric:        Mood and Affect: Mood normal.        Behavior: Behavior normal.        Thought Content: Thought content normal.        Judgment: Judgment normal.     Results for orders placed or performed in visit on 05/29/23  Basic metabolic panel   Collection Time: 05/29/23  9:16 AM  Result Value Ref Range   Glucose 94 70 - 99 mg/dL    BUN 36 (H) 8 - 27 mg/dL   Creatinine, Ser 1.19 (H) 0.57 - 1.00 mg/dL   eGFR 33 (L) >14 NW/GNF/6.21   BUN/Creatinine Ratio 23 12 - 28   Sodium 141 134 - 144 mmol/L   Potassium 5.3 (H) 3.5 - 5.2 mmol/L   Chloride 104 96 - 106 mmol/L   CO2 22 20 - 29 mmol/L   Calcium  9.8 8.7 - 10.3 mg/dL  TSH   Collection Time: 05/29/23  9:16 AM  Result Value Ref Range   TSH 15.600 (H) 0.450 - 4.500 uIU/mL  Urine Culture   Collection Time: 05/29/23 10:00 AM   Specimen: Urine   UR  Result Value Ref Range   Urine Culture, Routine Final report    Organism ID, Bacteria Comment   Microscopic Examination   Collection Time: 05/29/23 10:00 AM   Urine  Result Value Ref Range   WBC, UA 11-30 (A) 0 - 5 /hpf   RBC, Urine 3-10 (A) 0 - 2 /hpf   Epithelial Cells (non renal) 0-10 0 - 10 /hpf   Mucus, UA Present (A) Not Estab.   Bacteria, UA Moderate (A) None seen/Few  Urinalysis, Routine w reflex microscopic   Collection Time: 05/29/23 10:00 AM  Result Value Ref Range   Specific Gravity, UA 1.020 1.005 - 1.030   pH, UA 6.0 5.0 - 7.5   Color, UA Yellow Yellow   Appearance Ur Hazy (A) Clear   Leukocytes,UA Trace (A) Negative   Protein,UA Trace (A) Negative/Trace   Glucose, UA Negative Negative   Ketones, UA Negative Negative   RBC, UA Trace (A) Negative   Bilirubin, UA Negative Negative   Urobilinogen, Ur 0.2 0.2 - 1.0 mg/dL   Nitrite, UA Negative Negative   Microscopic Examination See below:    *Note: Due to a large number of results and/or encounters for the requested time period, some results have not been displayed. A complete set of results can be found in Results Review.      Assessment & Plan:   Problem List Items Addressed This Visit       Cardiovascular and Mediastinum   Essential hypertension (Chronic)   Overtreated. Will cut her imdur  in 1/2 and recheck in 6 weeks. Call with any concerns.       Relevant Medications   isosorbide  mononitrate (IMDUR ) 30 MG 24 hr tablet   Type 2  diabetes mellitus with diabetic peripheral angiopathy without gangrene (HCC) - Primary   Doing great with A1c of 5.8. Continue current regimen. Continue to  monitor. Recheck 3 months. Call with any concerns.       Relevant Medications   isosorbide  mononitrate (IMDUR ) 30 MG 24 hr tablet   tirzepatide  (MOUNJARO ) 12.5 MG/0.5ML Pen   Other Relevant Orders   Bayer DCA Hb A1c Waived     Follow up plan: Return in about 6 weeks (around 08/23/2023).

## 2023-07-17 ENCOUNTER — Other Ambulatory Visit: Payer: Self-pay | Admitting: Family Medicine

## 2023-07-17 ENCOUNTER — Other Ambulatory Visit: Payer: Self-pay

## 2023-07-18 ENCOUNTER — Other Ambulatory Visit: Payer: Self-pay

## 2023-07-19 ENCOUNTER — Other Ambulatory Visit: Payer: Self-pay

## 2023-07-19 NOTE — Telephone Encounter (Signed)
 Duplicate request, refilled 07/17/23.  Requested Prescriptions  Pending Prescriptions Disp Refills   MOUNJARO  12.5 MG/0.5ML Pen [Pharmacy Med Name: tirzepatide  (MOUNJARO ) 12.5 MG/0.5ML Pen] 6 mL 1    Sig: Inject 12.5 mg into the skin once a week.     Off-Protocol Failed - 07/19/2023  3:40 PM      Failed - Medication not assigned to a protocol, review manually.      Passed - Valid encounter within last 12 months    Recent Outpatient Visits           1 week ago Type 2 diabetes mellitus with diabetic peripheral angiopathy without gangrene, without long-term current use of insulin  [facility])   [place] [facility] [place], Megan P, DO   1 month ago Hypoglycemia   [place] [facility] [place], Megan P, DO   1 month ago Acute cystitis with hematuria   [place] [facility] [place], Megan P, DO

## 2023-07-23 ENCOUNTER — Other Ambulatory Visit: Payer: Self-pay

## 2023-07-24 ENCOUNTER — Other Ambulatory Visit: Payer: Self-pay

## 2023-07-24 MED ORDER — MOUNJARO 12.5 MG/0.5ML ~~LOC~~ SOAJ
12.5000 mg | SUBCUTANEOUS | 1 refills | Status: DC
  Filled 2023-07-24: qty 6, 84d supply, fill #0

## 2023-07-25 ENCOUNTER — Encounter (INDEPENDENT_AMBULATORY_CARE_PROVIDER_SITE_OTHER): Payer: Medicare Other | Admitting: Ophthalmology

## 2023-07-25 ENCOUNTER — Other Ambulatory Visit: Payer: Self-pay

## 2023-07-26 ENCOUNTER — Other Ambulatory Visit: Payer: Self-pay

## 2023-07-30 ENCOUNTER — Other Ambulatory Visit: Payer: Self-pay | Admitting: Family Medicine

## 2023-07-30 ENCOUNTER — Ambulatory Visit: Admitting: Family Medicine

## 2023-07-31 ENCOUNTER — Encounter (INDEPENDENT_AMBULATORY_CARE_PROVIDER_SITE_OTHER): Payer: Self-pay

## 2023-08-01 NOTE — Telephone Encounter (Signed)
 Requested medication (s) are due for refill today:   Yes for all 3  Requested medication (s) are on the active medication list:   Yes for all 3  Future visit scheduled:   Yes 6/12    LOV 07/12/2023    Last ordered: Cymbalta  04/10/2023 #180, 1 refill;   Metoprolol  05/29/2023 #30, 3 refills;   Trelegy 04/25/2022,  3 each, 4 refills   Unable to refill because there is not a protocol assigned to Trelegy.     Requested Prescriptions  Pending Prescriptions Disp Refills   DULoxetine  (CYMBALTA ) 60 MG capsule [Pharmacy Med Name: DULOXETINE  HCL 60 MG CAP] 180 capsule 1    Sig: TAKE 2 CAPSULES BY MOUTH ONCE DAILY     Psychiatry: Antidepressants - SNRI - duloxetine  Failed - 08/01/2023 11:29 AM      Failed - Cr in normal range and within 360 days    Creatinine  Date Value Ref Range Status  04/30/2023 1.77 (H) 0.44 - 1.00 mg/dL Final  84/69/6295 2.84 (H) 0.60 - 1.30 mg/dL Final   Creatinine, Ser  Date Value Ref Range Status  05/29/2023 1.58 (H) 0.57 - 1.00 mg/dL Final         Passed - eGFR is 30 or above and within 360 days    EGFR (African American)  Date Value Ref Range Status  09/20/2011 48 (L)  Final   GFR calc Af Amer  Date Value Ref Range Status  02/10/2020 32 (L) >59 mL/min/1.73 Final    Comment:    **In accordance with recommendations from the NKF-ASN Task force,**   [facility] is in the process of updating its eGFR calculation to the   2021 CKD-EPI creatinine equation that estimates kidney function   without a race variable.    EGFR (Non-African Amer.)  Date Value Ref Range Status  09/20/2011 42 (L)  Final    Comment:    eGFR values <27mL/min/1.73 m2 may be an indication of chronic kidney disease (CKD). Calculated eGFR is useful in patients with stable renal function. The eGFR calculation will not be reliable in acutely ill patients when serum creatinine is changing rapidly. It is not useful in  patients on dialysis. The eGFR calculation may not be applicable to patients at  the low and high extremes of body sizes, pregnant women, and vegetarians.    GFR, Estimated  Date Value Ref Range Status  04/30/2023 29 (L) >60 mL/min Final    Comment:    (NOTE) Calculated using the CKD-EPI Creatinine Equation (2021)    eGFR  Date Value Ref Range Status  05/29/2023 33 (L) >59 mL/min/1.73 Final         Passed - Completed PHQ-2 or PHQ-9 in the last 360 days      Passed - Last BP in normal range    BP Readings from Last 1 Encounters:  07/12/23 97/64         Passed - Valid encounter within last 6 months    Recent Outpatient Visits           2 weeks ago Type 2 diabetes mellitus with diabetic peripheral angiopathy without gangrene, without long-term current use of insulin  [facility])   [place] [facility] [place], Megan P, DO   1 month ago Hypoglycemia   [place] [place], Megan P, DO   2 months ago Acute cystitis with hematuria   [place] [facility] [place], Megan P, DO  metoprolol  tartrate (LOPRESSOR ) 50 MG tablet [Pharmacy Med Name: METOPROLOL  TARTRATE 50 MG TAB] 60 tablet     Sig: TAKE ONE (1) TABLET BY MOUTH TWO TIMES PER DAY     Cardiovascular:  Beta Blockers Passed - 08/01/2023 11:29 AM      Passed - Last BP in normal range    BP Readings from Last 1 Encounters:  07/12/23 97/64         Passed - Last Heart Rate in normal range    Pulse Readings from Last 1 Encounters:  07/12/23 73         Passed - Valid encounter within last 6 months    Recent Outpatient Visits           2 weeks ago Type 2 diabetes mellitus with diabetic peripheral angiopathy without gangrene, without long-term current use of insulin  (HCC)   [place] [facility] [place], Megan P, DO   1 month ago Hypoglycemia   [place] [facility] [place], [place] P, DO   2 months ago Acute cystitis with hematuria   [facility] [place], Megan P, DO                TRELEGY ELLIPTA  100-62.5-25 MCG/ACT AEPB [Pharmacy Med Name: TRELEGY ELLIPTA  100-62.5-25 MCG/ACT] 180 each     Sig: Inhale 1 puff into the lungs daily.     Off-Protocol Failed - 08/01/2023 11:29 AM      Failed - Medication not assigned to a protocol, review manually.      Passed - Valid encounter within last 12 months    Recent Outpatient Visits           2 weeks ago Type 2 diabetes mellitus with diabetic peripheral angiopathy without gangrene, without long-term current use of insulin  [facility])   [place] [facility] [place], Megan P, DO   1 month ago Hypoglycemia   [place] [facility] [place], [place] P, DO   2 months ago Acute cystitis with hematuria   [place] [facility] [place], Megan P, DO

## 2023-08-03 ENCOUNTER — Ambulatory Visit: Admitting: Family Medicine

## 2023-08-03 ENCOUNTER — Ambulatory Visit: Payer: Self-pay

## 2023-08-03 NOTE — Telephone Encounter (Signed)
 Copied from CRM [id number]. Topic: Clinical - Red Word Triage >> Aug 03, 2023  8:50 AM Emylou G wrote: [facility] that prompted transfer to Nurse Triage: can barely move to get dressed.. no energy.Tammy [name] lower back is extreme pain when she walks.. no control over urine..   Chief Complaint: mid back pain Symptoms: trouble controlling urine Frequency: constant Pertinent Negatives: Patient denies fever Disposition: [] ED /[] Urgent Care (no appt availability in office) / [] Appointment(In office/virtual)/ []  [place] [facility]/ [] Home Care/ [] Refused Recommended Disposition /[] [place] [facility]/ []  Follow-up with PCP Additional Notes: aPatient missed appt today, says she feels too weak. Pt endorses middle back pain x 1 week and reports that she has been unable to control her urine. Pt says she can feel when she has to urinate, but cannot control it. RN advising ED, pt refused, she states this happens when she has a UTI. RN offered to reschedule appt, but pt refused, she states she has to tstay with her husband who is sick at this time. Pt asking if pcp can order abx for a UTI so she can "start with that" . Will route message HP to office for f/u.   Reason for Disposition  [1] Loss of bladder or bowel control (urine or bowel incontinence; wetting self, leaking stool) AND [2] new-onset  Answer Assessment - Initial Assessment Questions 1. ONSET: "When did the pain begin?"      1 week  2. LOCATION: "Where does it hurt?" (upper, mid or lower back)     Middle back pain  3. SEVERITY: "How bad is the pain?"  (e.g., Scale 1-10; mild, moderate, or severe)   - MILD (1-3): Doesn't interfere with normal activities.    - MODERATE (4-7): Interferes with normal activities or awakens from sleep.    - SEVERE (8-10): Excruciating pain, unable to do any normal activities.      10/10 pain  4. PATTERN: "Is the pain constant?" (e.g., yes, no; constant, intermittent)      Intermittent, better when sitting down and  worse when walking  5. RADIATION: "Does the pain shoot into your legs or somewhere else?"     No  6. CAUSE:  "What do you think is causing the back pain?"      Believes it is a UTI  7. BACK OVERUSE:  "Any recent lifting of heavy objects, strenuous work or exercise?"     No  8. MEDICINES: "What have you taken so far for the pain?" (e.g., nothing, acetaminophen , NSAIDS)     Taking tylenol , but helps very little  9. NEUROLOGIC SYMPTOMS: "Do you have any weakness, numbness, or problems with bowel/bladder control?"     Has weakness over bladder  10. OTHER SYMPTOMS: "Do you have any other symptoms?" (e.g., fever, abdomen pain, burning with urination, blood in urine)       No  11. PREGNANCY: "Is there any chance you are pregnant?" "When was your last menstrual period?"       no  Protocols used: Back Pain-A-AH

## 2023-08-03 NOTE — Telephone Encounter (Signed)
 She no-showed appt this morning. I do advise ER or UC

## 2023-08-07 ENCOUNTER — Telehealth (INDEPENDENT_AMBULATORY_CARE_PROVIDER_SITE_OTHER): Payer: Self-pay

## 2023-08-07 NOTE — Telephone Encounter (Signed)
 Returned call to patient. She states she has been taking some medication she believes she wa prescribed for a UTI prior however understand we can not endorse her using this and she understands. She states that she thinks its starting to resolve and due to her husband poor health she can not leave him at home to attend an appointment right now. She will call us  back if she feels she needs to be seen prior to her scheduled follow up.

## 2023-08-07 NOTE — Telephone Encounter (Addendum)
 Given her pain and weakness, I do advise an in-person evaluation either here or at the UC/ER, however, if she can't do that if someone can drop off a urine for her, I can do a virtual appointment for her. But I do need the urine.

## 2023-08-07 NOTE — Telephone Encounter (Signed)
 Patient reach out to the office requesting to reschedule missed appointment but also she has been having right leg pain for past 2 years. Patient states that the pain has been going on since her knee replacement and she has an appointment on 08/16/23 with Dr Aubry Blase. Patient will contact the office if she need to be seen for vascular evaluation.

## 2023-08-09 ENCOUNTER — Other Ambulatory Visit: Payer: Self-pay | Admitting: Family Medicine

## 2023-08-09 DIAGNOSIS — R300 Dysuria: Secondary | ICD-10-CM

## 2023-08-09 NOTE — Telephone Encounter (Signed)
 Attempted to discuss with patient however call was disconnected and I could not reach patient back.

## 2023-08-10 ENCOUNTER — Other Ambulatory Visit

## 2023-08-10 ENCOUNTER — Ambulatory Visit (INDEPENDENT_AMBULATORY_CARE_PROVIDER_SITE_OTHER): Admitting: Family Medicine

## 2023-08-10 ENCOUNTER — Encounter: Payer: Self-pay | Admitting: Family Medicine

## 2023-08-10 ENCOUNTER — Other Ambulatory Visit: Payer: Self-pay

## 2023-08-10 VITALS — BP 101/65 | HR 66 | Ht 66.0 in | Wt 173.0 lb

## 2023-08-10 DIAGNOSIS — R300 Dysuria: Secondary | ICD-10-CM

## 2023-08-10 DIAGNOSIS — N3001 Acute cystitis with hematuria: Secondary | ICD-10-CM | POA: Diagnosis not present

## 2023-08-10 LAB — URINALYSIS, ROUTINE W REFLEX MICROSCOPIC

## 2023-08-10 LAB — MICROSCOPIC EXAMINATION

## 2023-08-10 MED ORDER — CIPROFLOXACIN HCL 500 MG PO TABS: 500.0000 mg | ORAL_TABLET | Freq: Two times a day (BID) | ORAL | 0 refills | Status: AC

## 2023-08-10 NOTE — Telephone Encounter (Signed)
 Patient aware she must present to office before her schedule virtual visit today to complete urine specimen.

## 2023-08-10 NOTE — Progress Notes (Signed)
 BP 101/65 (BP Location: Left Arm, Patient Position: Sitting, Cuff Size: Normal)   Pulse 66   Ht 5\' 6"  (1.676 m)   Wt 173 lb (78.5 kg)   SpO2 96%   BMI 27.92 kg/m    Subjective:    Patient ID: Tammy [name], female    DOB: 08-10-1945, 78 y.o.   MRN: [id number]  HPI: Tammy [name] is a 78 y.o. female  Chief Complaint  Patient presents with   Urinary Tract Infection   URINARY SYMPTOMS Duration: 2 weeks Dysuria: no Urinary frequency: yes Urgency: yes Small volume voids: no Symptom severity: no Urinary incontinence: yes Foul odor: yes Hematuria: no Abdominal pain: yes Back pain: yes Suprapubic pain/pressure: yes Flank pain: yes Fever:  no Vomiting: no Relief with cranberry juice: no Relief with pyridium: no Status: worse Previous urinary tract infection: yes Recurrent urinary tract infection: no Sexual activity: No sexually active History of sexually transmitted disease: no Vaginal discharge: no Treatments attempted: none   Relevant past medical, surgical, family and social history reviewed and updated as indicated. Interim medical history since our last visit reviewed. Allergies and medications reviewed and updated.  Review of Systems  Constitutional: Negative.   Respiratory: Negative.    Cardiovascular: Negative.   Genitourinary:  Positive for flank pain, frequency and urgency. Negative for decreased urine volume, difficulty urinating, dyspareunia, dysuria, enuresis, genital sores, hematuria, menstrual problem, pelvic pain, vaginal bleeding, vaginal discharge and vaginal pain.  Musculoskeletal:  Positive for back pain. Negative for arthralgias, gait problem, joint swelling, myalgias, neck pain and neck stiffness.  Psychiatric/Behavioral: Negative.      Per HPI unless specifically indicated above     Objective:     BP 101/65 (BP Location: Left Arm, Patient Position: Sitting, Cuff Size: Normal)   Pulse 66   Ht 5\' 6"  (1.676 m)   Wt 173 lb (78.5 kg)   SpO2 96%    BMI 27.92 kg/m   Wt Readings from Last 3 Encounters:  08/10/23 173 lb (78.5 kg)  07/12/23 173 lb 6.4 oz (78.7 kg)  06/19/23 178 lb (80.7 kg)    Physical Exam Vitals and nursing note reviewed.  Constitutional:      General: She is not in acute distress.    Appearance: Normal appearance. She is not ill-appearing, toxic-appearing or diaphoretic.     Comments: Wheelchair bound  HENT:     Head: Normocephalic and atraumatic.     Right Ear: External ear normal.     Left Ear: External ear normal.     Nose: Nose normal.     Mouth/Throat:     Mouth: Mucous membranes are moist.     Pharynx: Oropharynx is clear.  Eyes:     General: No scleral icterus.       Right eye: No discharge.        Left eye: No discharge.     Extraocular Movements: Extraocular movements intact.     Conjunctiva/sclera: Conjunctivae normal.     Pupils: Pupils are equal, round, and reactive to light.  Cardiovascular:     Rate and Rhythm: Normal rate and regular rhythm.     Pulses: Normal pulses.     Heart sounds: Normal heart sounds. No murmur heard.    No friction rub. No gallop.  Pulmonary:     Effort: Pulmonary effort is normal. No respiratory distress.     Breath sounds: Normal breath sounds. No stridor. No wheezing, rhonchi or rales.  Chest:     Chest  wall: No tenderness.  Abdominal:     General: Abdomen is flat. Bowel sounds are normal. There is no distension.     Palpations: Abdomen is soft. There is no mass.     Tenderness: There is no abdominal tenderness. There is no right CVA tenderness, left CVA tenderness, guarding or rebound.     Hernia: No hernia is present.  Musculoskeletal:        General: Normal range of motion.     Cervical back: Normal range of motion and neck supple.  Skin:    General: Skin is warm and dry.     Capillary Refill: Capillary refill takes less than 2 seconds.     Coloration: Skin is not jaundiced or pale.     Findings: No bruising, erythema, lesion or rash.   Neurological:     General: No focal deficit present.     Mental Status: She is alert and oriented to person, place, and time. Mental status is at baseline.  Psychiatric:        Mood and Affect: Mood normal.        Behavior: Behavior normal.        Thought Content: Thought content normal.        Judgment: Judgment normal.     Results for orders placed or performed in visit on 07/12/23  Bayer DCA Hb A1c Waived   Collection Time: 07/12/23  2:18 PM  Result Value Ref Range   HB A1C (BAYER DCA - WAIVED) 5.8 (H) 4.8 - 5.6 %   *Note: Due to a large number of results and/or encounters for the requested time period, some results have not been displayed. A complete set of results can be found in Results Review.      Assessment & Plan:   Problem List Items Addressed This Visit   None Visit Diagnoses       Acute cystitis with hematuria    -  Primary   Will treat empirically with cipro  and await culture. Call with any concerns or if not getting better.     Dysuria            Follow up plan: Return for As scheduled.

## 2023-08-13 LAB — URINE CULTURE

## 2023-08-14 ENCOUNTER — Other Ambulatory Visit: Payer: Self-pay | Admitting: Family Medicine

## 2023-08-15 NOTE — Telephone Encounter (Signed)
 Requested by interface surescripts. Medication dose discontinued 06/04/23.  Requested Prescriptions  Refused Prescriptions Disp Refills   SYNTHROID  75 MCG tablet [Pharmacy Med Name: SYNTHROID  75 MCG TAB] 90 tablet 0    Sig: TAKE 1 TABLET EVERY DAY ON EMPTY STOMACHWITH A GLASS OF WATER  AT LEAST 30-60 MINBEFORE BREAKFAST     Endocrinology:  Hypothyroid Agents Failed - 08/15/2023  2:16 PM      Failed - TSH in normal range and within 360 days    TSH  Date Value Ref Range Status  05/29/2023 15.600 (H) 0.450 - 4.500 uIU/mL Final         Passed - Valid encounter within last 12 months    Recent Outpatient Visits           5 days ago Acute cystitis with hematuria   [place] [facility] [place], Megan P, DO   1 month ago Type 2 diabetes mellitus with diabetic peripheral angiopathy without gangrene, without long-term current use of insulin  [facility])   [facility] [place], Megan P, DO   1 month ago Hypoglycemia   [place] [facility] [place], Megan P, DO   2 months ago Acute cystitis with hematuria    [facility] [place], Megan P, DO

## 2023-08-22 ENCOUNTER — Other Ambulatory Visit: Payer: Self-pay | Admitting: Internal Medicine

## 2023-08-22 ENCOUNTER — Other Ambulatory Visit: Payer: Self-pay | Admitting: Family Medicine

## 2023-08-23 ENCOUNTER — Ambulatory Visit: Admitting: Family Medicine

## 2023-08-24 NOTE — Telephone Encounter (Signed)
 Requested medication (s) are due for refill today: yes   Requested medication (s) are on the active medication list: yes   Last refill:  05/29/23 #30 3 refills  Future visit scheduled: no   Notes to clinic:  no refills remain. Do you want to refill Rx?     Requested Prescriptions  Pending Prescriptions Disp Refills   metoprolol  tartrate (LOPRESSOR ) 50 MG tablet [Pharmacy Med Name: METOPROLOL  TARTRATE 50 MG TAB] 60 tablet     Sig: TAKE ONE (1) TABLET BY MOUTH TWO TIMES PER DAY     Cardiovascular:  Beta Blockers Passed - 08/24/2023  9:16 AM      Passed - Last BP in normal range    BP Readings from Last 1 Encounters:  08/10/23 101/65         Passed - Last Heart Rate in normal range    Pulse Readings from Last 1 Encounters:  08/10/23 66         Passed - Valid encounter within last 6 months    Recent Outpatient Visits           2 weeks ago Acute cystitis with hematuria   [place] [facility] [place], Megan P, DO   1 month ago Type 2 diabetes mellitus with diabetic peripheral angiopathy without gangrene, without long-term current use of insulin  [facility])   [place] [facility] [place], Megan P, DO   2 months ago Hypoglycemia   [place] [facility] [place], Megan P, DO   2 months ago Acute cystitis with hematuria   [place] [facility] [place], Megan P, DO

## 2023-08-27 ENCOUNTER — Ambulatory Visit (INDEPENDENT_AMBULATORY_CARE_PROVIDER_SITE_OTHER): Admitting: Family Medicine

## 2023-08-27 ENCOUNTER — Encounter: Payer: Self-pay | Admitting: Family Medicine

## 2023-08-27 ENCOUNTER — Other Ambulatory Visit: Payer: Self-pay

## 2023-08-27 VITALS — BP 103/70 | HR 70 | Ht 66.0 in | Wt 173.0 lb

## 2023-08-27 DIAGNOSIS — M545 Low back pain, unspecified: Secondary | ICD-10-CM | POA: Diagnosis not present

## 2023-08-27 DIAGNOSIS — E1151 Type 2 diabetes mellitus with diabetic peripheral angiopathy without gangrene: Secondary | ICD-10-CM

## 2023-08-27 DIAGNOSIS — I10 Essential (primary) hypertension: Secondary | ICD-10-CM

## 2023-08-27 DIAGNOSIS — G8929 Other chronic pain: Secondary | ICD-10-CM | POA: Diagnosis not present

## 2023-08-27 DIAGNOSIS — E039 Hypothyroidism, unspecified: Secondary | ICD-10-CM

## 2023-08-27 MED ORDER — TIRZEPATIDE 10 MG/0.5ML ~~LOC~~ SOAJ
10.0000 mg | SUBCUTANEOUS | 1 refills | Status: DC
  Filled 2023-08-27: qty 6, 84d supply, fill #0

## 2023-08-27 MED ORDER — GABAPENTIN 400 MG PO CAPS: 800.0000 mg | ORAL_CAPSULE | Freq: Three times a day (TID) | ORAL | 0 refills | Status: DC

## 2023-08-27 NOTE — Progress Notes (Unsigned)
 BP 103/70 (BP Location: Left Arm, Patient Position: Sitting, Cuff Size: Normal)   Pulse 70   Ht 5' 6 (1.676 m)   Wt 173 lb (78.5 kg)   SpO2 94%   BMI 27.92 kg/m    Subjective:    Patient ID: Tammy [name], female    DOB: 25-Jul-1945, 78 y.o.   MRN: [id number]  HPI: Tammy [name] is a 78 y.o. female  No chief complaint on file.  HYPOTHYROIDISM Thyroid  control status:{Blank single:19197::controlled,uncontrolled,better,worse,exacerbated,stable} Satisfied with current treatment? {Blank single:19197::yes,no} Medication side effects: {Blank single:19197::yes,no} Medication compliance: {Blank single:19197::excellent compliance,good compliance,fair compliance,poor compliance} Etiology of hypothyroidism:  Recent dose adjustment:{Blank single:19197::yes,no} Fatigue: {Blank single:19197::yes,no} Cold intolerance: {Blank single:19197::yes,no} Heat intolerance: {Blank single:19197::yes,no} Weight gain: {Blank single:19197::yes,no} Weight loss: {Blank single:19197::yes,no} Constipation: {Blank single:19197::yes,no} Diarrhea/loose stools: {Blank single:19197::yes,no} Palpitations: {Blank single:19197::yes,no} Lower extremity edema: {Blank single:19197::yes,no} Anxiety/depressed mood: {Blank single:19197::yes,no}  DIABETES Hypoglycemic episodes:yes Polydipsia/polyuria: no Visual disturbance: no Chest pain: no Paresthesias: no Glucose Monitoring: yes  Accucheck frequency: continuous Taking Insulin ?: no Blood Pressure Monitoring: not checking Retinal Examination: {Blank single:19197::Up to Date,Not up to Date} Foot Exam: {Blank single:19197::Up to Date,Not up to Date} Diabetic Education: {Blank single:19197::Completed,Not Completed} Pneumovax: {Blank single:19197::Up to Date,Not up to Date,unknown} Influenza: {Blank single:19197::Up to Date,Not up to Date,unknown} Aspirin : {Blank  single:19197::yes,no}  Relevant past medical, surgical, family and social history reviewed and updated as indicated. Interim medical history since our last visit reviewed. Allergies and medications reviewed and updated.  Review of Systems  Constitutional: Negative.   Respiratory: Negative.    Cardiovascular: Negative.   Musculoskeletal:  Positive for back pain and myalgias. Negative for arthralgias, gait problem, joint swelling, neck pain and neck stiffness.  Skin: Negative.   Neurological: Negative.   Psychiatric/Behavioral: Negative.      Per HPI unless specifically indicated above     Objective:    BP 103/70 (BP Location: Left Arm, Patient Position: Sitting, Cuff Size: Normal)   Pulse 70   Ht 5' 6 (1.676 m)   Wt 173 lb (78.5 kg)   SpO2 94%   BMI 27.92 kg/m   Wt Readings from Last 3 Encounters:  08/27/23 173 lb (78.5 kg)  08/10/23 173 lb (78.5 kg)  07/12/23 173 lb 6.4 oz (78.7 kg)    Physical Exam Vitals and nursing note reviewed.  Constitutional:      General: She is not in acute distress.    Appearance: Normal appearance. She is obese. She is not ill-appearing, toxic-appearing or diaphoretic.  HENT:     Head: Normocephalic and atraumatic.     Right Ear: External ear normal.     Left Ear: External ear normal.     Nose: Nose normal.     Mouth/Throat:     Mouth: Mucous membranes are moist.     Pharynx: Oropharynx is clear.   Eyes:     General: No scleral icterus.       Right eye: No discharge.        Left eye: No discharge.     Extraocular Movements: Extraocular movements intact.     Conjunctiva/sclera: Conjunctivae normal.     Pupils: Pupils are equal, round, and reactive to light.    Cardiovascular:     Rate and Rhythm: Normal rate and regular rhythm.     Pulses: Normal pulses.     Heart sounds: Normal heart sounds. No murmur heard.    No friction rub. No gallop.  Pulmonary:     Effort: Pulmonary effort is normal. No respiratory  distress.      Breath sounds: Normal breath sounds. No stridor. No wheezing, rhonchi or rales.  Chest:     Chest wall: No tenderness.   Musculoskeletal:        General: Normal range of motion.     Cervical back: Normal range of motion and neck supple.   Skin:    General: Skin is warm and dry.     Capillary Refill: Capillary refill takes less than 2 seconds.     Coloration: Skin is not jaundiced or pale.     Findings: No bruising, erythema, lesion or rash.   Neurological:     General: No focal deficit present.     Mental Status: She is alert and oriented to person, place, and time. Mental status is at baseline.   Psychiatric:        Mood and Affect: Mood normal.        Behavior: Behavior normal.        Thought Content: Thought content normal.        Judgment: Judgment normal.    Results for orders placed or performed in visit on 08/10/23  Urine Culture   Collection Time: 08/10/23  2:13 PM   Specimen: Urine   UR  Result Value Ref Range   Urine Culture, Routine Final report (A)    Organism ID, Bacteria Klebsiella aerogenes (A)    Antimicrobial Susceptibility Comment   Microscopic Examination   Collection Time: 08/10/23  2:13 PM   Urine  Result Value Ref Range   WBC, UA >30W 0 - 5 /hpf   RBC, Urine 0-2 0 - 2 /hpf   Epithelial Cells (non renal) 0-10 0 - 10 /hpf   Bacteria, UA Many (A) None seen/Few  Urinalysis, Routine w reflex microscopic   Collection Time: 08/10/23  2:13 PM  Result Value Ref Range   Specific Gravity, UA CANCELED    pH, UA CANCELED    Color, UA Green (A) Yellow   Appearance Ur Clear Clear   Protein,UA CANCELED    Glucose, UA CANCELED    Ketones, UA CANCELED    Microscopic Examination See below:    *Note: Due to a large number of results and/or encounters for the requested time period, some results have not been displayed. A complete set of results can be found in Results Review.      Assessment & Plan:   Problem List Items Addressed This Visit   None     Follow up plan: No follow-ups on file.

## 2023-08-27 NOTE — Telephone Encounter (Signed)
 Appt scheduled 08-27-23

## 2023-08-28 ENCOUNTER — Encounter: Payer: Self-pay | Admitting: Family Medicine

## 2023-08-28 LAB — TSH: TSH: 55.8 u[IU]/mL — ABNORMAL HIGH (ref 0.450–4.500)

## 2023-08-28 NOTE — Assessment & Plan Note (Signed)
 Having episodes of hypoglycemia. Last A1c was 5.8. Will cut her mounjaro  down to 10mg . Recheck 3 months.

## 2023-08-28 NOTE — Assessment & Plan Note (Signed)
 Still running low. Will stop her metoprolol  and recheck in 1 week.

## 2023-08-28 NOTE — Assessment & Plan Note (Signed)
 Not doing well. Will increase her gabapentin  to 800mg  TID. Call with any concerns.

## 2023-08-28 NOTE — Assessment & Plan Note (Signed)
 Rechecking labs today. Await results. Treat as needed.

## 2023-08-29 ENCOUNTER — Ambulatory Visit: Payer: Self-pay | Admitting: Family Medicine

## 2023-08-29 MED ORDER — LEVOTHYROXINE SODIUM 125 MCG PO TABS: 125.0000 ug | ORAL_TABLET | Freq: Every day | ORAL | 2 refills | Status: DC

## 2023-09-04 ENCOUNTER — Encounter: Payer: Self-pay | Admitting: Family Medicine

## 2023-09-04 ENCOUNTER — Ambulatory Visit (INDEPENDENT_AMBULATORY_CARE_PROVIDER_SITE_OTHER): Admitting: Family Medicine

## 2023-09-04 VITALS — BP 130/80 | HR 84 | Temp 97.4°F | Ht 66.0 in | Wt 180.0 lb

## 2023-09-04 DIAGNOSIS — I10 Essential (primary) hypertension: Secondary | ICD-10-CM | POA: Diagnosis not present

## 2023-09-04 NOTE — Assessment & Plan Note (Signed)
 BP improved. Feeling more like herself- having some chest pains. Continue to monitor. Call with any concerns. Continue to monitor.

## 2023-09-04 NOTE — Progress Notes (Signed)
 BP 130/80 (BP Location: Left Arm, Patient Position: Sitting, Cuff Size: Normal)   Pulse 84   Temp (!) 97.4 F (36.3 C) (Oral)   Ht 5' 6 (1.676 m)   Wt 180 lb (81.6 kg)   SpO2 96%   BMI 29.05 kg/m    Subjective:    Patient ID: Tammy [name], female    DOB: 1946-02-24, 78 y.o.   MRN: [id number]  HPI: Tammy [name] is a 78 y.o. female  Chief Complaint  Patient presents with   Hypertension   HYPERTENSION  Hypertension status: better  Satisfied with current treatment? unsure Duration of hypertension: chronic BP monitoring frequency:  rarely BP medication side effects:  no Medication compliance: excellent compliance Previous BP meds: imdur  Aspirin : no Recurrent headaches: no Visual changes: no Palpitations: no Dyspnea: no Chest pain: yes Lower extremity edema: no Dizzy/lightheaded: no  Relevant past medical, surgical, family and social history reviewed and updated as indicated. Interim medical history since our last visit reviewed. Allergies and medications reviewed and updated.  Review of Systems  Constitutional: Negative.   Respiratory: Negative.    Cardiovascular:  Positive for chest pain. Negative for palpitations and leg swelling.  Musculoskeletal: Negative.   Neurological: Negative.   Psychiatric/Behavioral: Negative.      Per HPI unless specifically indicated above     Objective:    BP 130/80 (BP Location: Left Arm, Patient Position: Sitting, Cuff Size: Normal)   Pulse 84   Temp (!) 97.4 F (36.3 C) (Oral)   Ht 5' 6 (1.676 m)   Wt 180 lb (81.6 kg)   SpO2 96%   BMI 29.05 kg/m   Wt Readings from Last 3 Encounters:  09/04/23 180 lb (81.6 kg)  08/27/23 173 lb (78.5 kg)  08/10/23 173 lb (78.5 kg)    Physical Exam Vitals and nursing note reviewed.  Constitutional:      General: She is not in acute distress.    Appearance: Normal appearance. She is not ill-appearing, toxic-appearing or diaphoretic.  HENT:     Head: Normocephalic and atraumatic.      Right Ear: External ear normal.     Left Ear: External ear normal.     Nose: Nose normal.     Mouth/Throat:     Mouth: Mucous membranes are moist.     Pharynx: Oropharynx is clear.   Eyes:     General: No scleral icterus.       Right eye: No discharge.        Left eye: No discharge.     Extraocular Movements: Extraocular movements intact.     Conjunctiva/sclera: Conjunctivae normal.     Pupils: Pupils are equal, round, and reactive to light.    Cardiovascular:     Rate and Rhythm: Normal rate and regular rhythm.     Pulses: Normal pulses.     Heart sounds: Normal heart sounds. No murmur heard.    No friction rub. No gallop.  Pulmonary:     Effort: Pulmonary effort is normal. No respiratory distress.     Breath sounds: Normal breath sounds. No stridor. No wheezing, rhonchi or rales.  Chest:     Chest wall: No tenderness.   Musculoskeletal:        General: Normal range of motion.     Cervical back: Normal range of motion and neck supple.   Skin:    General: Skin is warm and dry.     Capillary Refill: Capillary refill takes less than 2 seconds.  Coloration: Skin is not jaundiced or pale.     Findings: No bruising, erythema, lesion or rash.   Neurological:     General: No focal deficit present.     Mental Status: She is alert and oriented to person, place, and time. Mental status is at baseline.   Psychiatric:        Mood and Affect: Mood normal.        Behavior: Behavior normal.        Thought Content: Thought content normal.        Judgment: Judgment normal.     Results for orders placed or performed in visit on 08/27/23  TSH   Collection Time: 08/27/23  3:08 PM  Result Value Ref Range   TSH 55.800 (H) 0.450 - 4.500 uIU/mL   *Note: Due to a large number of results and/or encounters for the requested time period, some results have not been displayed. A complete set of results can be found in Results Review.      Assessment & Plan:   Problem List Items  Addressed This Visit       Cardiovascular and Mediastinum   Essential hypertension - Primary (Chronic)   BP improved. Feeling more like herself- having some chest pains. Continue to monitor. Call with any concerns. Continue to monitor.       Relevant Orders   Basic metabolic panel with GFR     Follow up plan: Return for As scheduled.

## 2023-09-05 LAB — BASIC METABOLIC PANEL WITH GFR
BUN/Creatinine Ratio: 18 (ref 12–28)
BUN: 29 mg/dL — ABNORMAL HIGH (ref 8–27)
CO2: 20 mmol/L (ref 20–29)
Calcium: 9.9 mg/dL (ref 8.7–10.3)
Chloride: 104 mmol/L (ref 96–106)
Creatinine, Ser: 1.6 mg/dL — ABNORMAL HIGH (ref 0.57–1.00)
Glucose: 117 mg/dL — ABNORMAL HIGH (ref 70–99)
Potassium: 4.9 mmol/L (ref 3.5–5.2)
Sodium: 141 mmol/L (ref 134–144)
eGFR: 32 mL/min/{1.73_m2} — ABNORMAL LOW (ref >59)

## 2023-09-06 ENCOUNTER — Ambulatory Visit: Payer: Self-pay | Admitting: Family Medicine

## 2023-09-07 ENCOUNTER — Other Ambulatory Visit: Payer: Self-pay | Admitting: Family Medicine

## 2023-09-10 ENCOUNTER — Ambulatory Visit (INDEPENDENT_AMBULATORY_CARE_PROVIDER_SITE_OTHER): Admitting: Nurse Practitioner

## 2023-09-10 ENCOUNTER — Other Ambulatory Visit (INDEPENDENT_AMBULATORY_CARE_PROVIDER_SITE_OTHER)

## 2023-09-10 ENCOUNTER — Ambulatory Visit (INDEPENDENT_AMBULATORY_CARE_PROVIDER_SITE_OTHER)

## 2023-09-10 ENCOUNTER — Encounter (INDEPENDENT_AMBULATORY_CARE_PROVIDER_SITE_OTHER): Payer: Self-pay | Admitting: Nurse Practitioner

## 2023-09-10 VITALS — BP 101/68 | HR 82 | Ht 66.0 in | Wt 176.4 lb

## 2023-09-10 DIAGNOSIS — G8929 Other chronic pain: Secondary | ICD-10-CM

## 2023-09-10 DIAGNOSIS — E1122 Type 2 diabetes mellitus with diabetic chronic kidney disease: Secondary | ICD-10-CM

## 2023-09-10 DIAGNOSIS — I739 Peripheral vascular disease, unspecified: Secondary | ICD-10-CM

## 2023-09-10 DIAGNOSIS — I10 Essential (primary) hypertension: Secondary | ICD-10-CM | POA: Diagnosis not present

## 2023-09-10 DIAGNOSIS — Z9889 Other specified postprocedural states: Secondary | ICD-10-CM

## 2023-09-10 DIAGNOSIS — Z794 Long term (current) use of insulin: Secondary | ICD-10-CM

## 2023-09-10 DIAGNOSIS — N183 Chronic kidney disease, stage 3 unspecified: Secondary | ICD-10-CM | POA: Diagnosis not present

## 2023-09-10 DIAGNOSIS — M545 Low back pain, unspecified: Secondary | ICD-10-CM

## 2023-09-10 NOTE — Telephone Encounter (Signed)
 Requested Prescriptions  Pending Prescriptions Disp Refills   montelukast  (SINGULAIR ) 10 MG tablet [Pharmacy Med Name: MONTELUKAST  SODIUM 10 MG TAB] 90 tablet 0    Sig: TAKE 1 TABLET BY MOUTH AT BEDTIME     Pulmonology:  Leukotriene Inhibitors Passed - 09/10/2023  2:48 PM      Passed - Valid encounter within last 12 months    Recent Outpatient Visits           6 days ago Essential hypertension   [place] [facility] [place], [place] P, DO   2 weeks ago Chronic bilateral low back pain without sciatica   [place] [facility] [place], Megan P, DO   1 month ago Acute cystitis with hematuria   [place] [facility] [place], Megan P, DO   2 months ago Type 2 diabetes mellitus with diabetic peripheral angiopathy without gangrene, without long-term current use of insulin  [facility])   [place] [facility] [place], Megan P, DO   2 months ago Hypoglycemia    [facility] [place], [place] SQUIBB, DO       Future Appointments             Tomorrow Loistine Sober, NP [facility]

## 2023-09-10 NOTE — Progress Notes (Signed)
 Subjective:    Patient ID: Tammy [name], female    DOB: [birth year], 78 y.o.   MRN: [id number] Chief Complaint  Patient presents with   3 month follow up with ABI & BIL art dup    The patient returns to the office for followup and review status post angiogram with intervention on 10/23/2022.    Procedure: Procedure(s) Performed:             1.  Ultrasound guidance for vascular access left femoral artery             2.  Catheter placement into right common femoral artery from left femoral approach             3.  Aortogram and selective right lower extremity angiogram             4.  Percutaneous transluminal angioplasty of right tibioperoneal trunk and most proximal posterior tibial artery with 4 mm diameter Lutonix drug-coated angioplasty balloon             5.  Percutaneous transluminal angioplasty of right SFA and popliteal arteries with 5 mm diameter by 30 cm length Lutonix drug-coated angioplasty balloon             6.  Stent placement to the right SFA and most proximal popliteal artery with 6 mm diameter by 25 cm length Viabahn stent             7.  StarClose closure device left femoral artery     She continues to note having pain and discomfort in her right lower extremity however it is more neuropathic in nature with the burning sensation radiating from her knee down her foot at rest.  She also notes she has more of a shooting pain from her hip down to her toes.-    There have been no significant changes to the patient's overall health care.   No documented history of amaurosis fugax or recent TIA symptoms. There are no recent neurological changes noted. No documented history of DVT, PE or superficial thrombophlebitis. The patient denies recent episodes of angina or shortness of breath.    ABI's Rt=0.98 and Lt=0.93  (previous ABI's Rt=1.12 and Lt=0.85) Duplex of the right lower extremity reveals biphasic waveforms throughout with a 50 to 75% stenosis of the TP trunk.   Multiphasic waveforms are noted in the left.     Review of Systems  All other systems reviewed and are negative.      Objective:   Physical Exam Vitals reviewed.  HENT:     Head: Normocephalic.   Cardiovascular:     Rate and Rhythm: Normal rate.     Pulses:          Dorsalis pedis pulses are detected w/ Doppler on the right side and 1+ on the left side.       Posterior tibial pulses are detected w/ Doppler on the right side.  Pulmonary:     Effort: Pulmonary effort is normal.   Skin:    General: Skin is warm and dry.   Neurological:     Mental Status: She is alert and oriented to person, place, and time.   Psychiatric:        Mood and Affect: Mood normal.        Behavior: Behavior normal.        Thought Content: Thought content normal.        Judgment: Judgment normal.     BP 101/68  Pulse 82   Ht 5' 6 (1.676 m)   Wt 176 lb 6 oz (80 kg)   BMI 28.47 kg/m   Past Medical History:  Diagnosis Date   Asthma    Benign neoplasm of colon    Breast cancer (HCC) 08/2018   left breast   Cervical disc disease    bulging - no limitations per pt   Chronic diastolic CHF (congestive heart failure) (HCC)    a. 07/2012 Echo: Nl EF. mild inferoseptal HK, Gr 2 DD, mild conc LVH, mild PR/TR, mild PAH; b. 08/2019 Echo: EF 55-60%, no rwma, Gr1 DD. Nl RV size/fxn.   CKD (chronic kidney disease), stage III (HCC)    COPD (chronic obstructive pulmonary disease) (HCC)    Coronary artery disease    a. 11/2011 Cath: LAD 28m/d, LCX min irregs, RCA 70p, 146m with L->R collats, EF 60%-->Med Rx; b. 03/2019 Cath: LM nl, LAD 40-67m. LCX large, mild diff dzs. OM1/2 nl. RCA known to be 100m. L-->R collats from LAD & LCX/OM.   Diabetes mellitus without complication (HCC)    Diagnosis unknown 05/07/2022   Fusion of lumbar spine 12/22/2015   GERD (gastroesophageal reflux disease)    History of DVT (deep vein thrombosis)    History of tobacco abuse    a. Quit 2011.   Hyperlipidemia     Hypertension    Hypothyroidism    Lung cancer (HCC)    Malignant neoplasm of unspecified site of right female breast (HCC) 03/14/1999   Obesity    Palpitations    Personal history of radiation therapy    PONV (postoperative nausea and vomiting)    Pulmonary embolus (HCC) 12/25/2013   RLL. Presented with SOB and elevated d-dimer.    PVD (peripheral vascular disease) (HCC)    a. PTA of right leg and left femoral artery with stenosis; b. 09/2020 ABI: R 0.93, L 0.88 - unchanged from prior study.   Stroke [facility]) 2015   TIAs - no deficits    Social History   Socioeconomic History   Marital status: Married    Spouse name: Not on file   Number of children: 2   Years of education: some college, Financial trader for interior design    Highest education level: Associate degree: occupational, Scientist, product/process development, or vocational program  Occupational History   Occupation: Retired  Tobacco Use   Smoking status: Former    Current packs/day: 0.00    Average packs/day: 0.5 packs/day for 15.0 years (7.5 ttl pk-yrs)    Types: Cigarettes    Start date: 03/13/1992    Quit date: 03/14/2007    Years since quitting: 16.5   Smokeless tobacco: Never  Vaping Use   Vaping status: Never Used  Substance and Sexual Activity   Alcohol  use: No    Alcohol /week: 0.0 standard drinks of alcohol    Drug use: Never   Sexual activity: Not Currently    Birth control/protection: Post-menopausal  Other Topics Concern   Not on file  Social History Narrative   Lives at home with husband in [place]. Quit smoking 10 years ago; never alcohol . 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    Social Drivers of Corporate investment banker Strain: Low Risk  (04/12/2023)   Overall Financial Resource Strain (CARDIA)    Difficulty of Paying Living Expenses: Not hard at all  Food Insecurity: No Food Insecurity (04/12/2023)   Hunger Vital Sign    Worried About Running  Out of Food in the Last Year:  Never true    Ran Out of Food in the Last Year: Never true  Transportation Needs: No Transportation Needs (04/12/2023)   PRAPARE - Administrator, Civil Service (Medical): No    Lack of Transportation (Non-Medical): No  Physical Activity: Inactive (04/12/2023)   Exercise Vital Sign    Days of Exercise per Week: 0 days    Minutes of Exercise per Session: 0 min  Stress: Stress Concern Present (04/12/2023)   [employer] of Occupational Health - Occupational Stress Questionnaire    Feeling of Stress : To some extent  Social Connections: Moderately Integrated (04/12/2023)   Social Connection and Isolation Panel    Frequency of Communication with Friends and Family: More than three times a week    Frequency of Social Gatherings with Friends and Family: More than three times a week    Attends Religious Services: More than 4 times per year    Active Member of [employer] or Organizations: No    Attends Banker Meetings: Never    Marital Status: Married  Catering manager Violence: Not At Risk (04/12/2023)   Humiliation, Afraid, Rape, and Kick questionnaire    Fear of Current or Ex-Partner: No    Emotionally Abused: No    Physically Abused: No    Sexually Abused: No    Past Surgical History:  Procedure Laterality Date   ANGIOPLASTY / STENTING FEMORAL     PVD; angioplasty right leg and left femoral artery with stenosis.    APPENDECTOMY  12/2014   BACK SURGERY  03/2012   nerve stimulator inserted   BACK SURGERY     screws, rods replaced with new hardware.   BACK SURGERY  11/2016   BREAST LUMPECTOMY Left 08/26/2018   BREAST SURGERY     CARDIAC CATHETERIZATION  12/07/2011   Mid LAD 50%, distal LAD 50%, mid RCA 70%, distal RCA 100%    CARDIAC CATHETERIZATION  01/04/2011   100% occluded mid RCA with good collaterals from distal LAD, normal LVEF.   CATARACT EXTRACTION Right 12/21/2021   CHOLECYSTECTOMY     COLONOSCOPY  2013   COLONOSCOPY WITH PROPOFOL  N/A 05/24/2015    Procedure: COLONOSCOPY WITH PROPOFOL ;  Surgeon: Rogelia Copping, MD;  Location: [facility];  Service: Endoscopy;  Laterality: N/A;  Diabetic - oral meds   COLONOSCOPY WITH PROPOFOL  N/A 01/28/2019   Procedure: COLONOSCOPY WITH PROPOFOL ;  Surgeon: Janalyn Keene NOVAK, MD;  Location: [facility];  Service: Endoscopy;  Laterality: N/A;   DIAGNOSTIC LAPAROSCOPY     ESOPHAGOGASTRODUODENOSCOPY (EGD) WITH PROPOFOL  N/A 01/28/2019   Procedure: ESOPHAGOGASTRODUODENOSCOPY (EGD) WITH PROPOFOL ;  Surgeon: Janalyn Keene NOVAK, MD;  Location: [facility];  Service: Endoscopy;  Laterality: N/A;   LAPAROSCOPIC APPENDECTOMY N/A 01/06/2015   Procedure: APPENDECTOMY LAPAROSCOPIC;  Surgeon: Louanne KANDICE Muse, MD;  Location: [facility];  Service: General;  Laterality: N/A;   LEFT HEART CATH AND CORONARY ANGIOGRAPHY Left 04/04/2019   Procedure: LEFT HEART CATH AND CORONARY ANGIOGRAPHY;  Surgeon: Perla Evalene PARAS, MD;  Location: [facility];  Service: Cardiovascular;  Laterality: Left;   LOWER EXTREMITY INTERVENTION Right 10/23/2022   Procedure: Lower Extremity Angiography;  Surgeon: Marea Selinda RAMAN, MD;  Location: [facility];  Service: Cardiovascular;  Laterality: Right;   MASTECTOMY W/ SENTINEL NODE BIOPSY Left 08/26/2018   Procedure: PARTIAL MASTECTOMY WIDE EXCISION WITH SENTINEL LYMPH NODE BIOPSY LEFT;  Surgeon: Dessa Reyes ORN, MD;  Location: [facility];  Service:  General;  Laterality: Left;   POLYPECTOMY  05/24/2015   Procedure: POLYPECTOMY;  Surgeon: Rogelia Copping, MD;  Location: [facility];  Service: Endoscopy;;   REPLACEMENT TOTAL KNEE Right    RIGHT/LEFT HEART CATH AND CORONARY ANGIOGRAPHY Bilateral 09/26/2021   Procedure: RIGHT/LEFT HEART CATH AND CORONARY ANGIOGRAPHY;  Surgeon: Darron Deatrice LABOR, MD;  Location: [facility];  Service: Cardiovascular;  Laterality: Bilateral;   THYROIDECTOMY     TOTAL KNEE ARTHROPLASTY Right 11/13/2019   TRIGGER FINGER RELEASE       Family History  Problem Relation Age of Onset   Cancer Mother        colon   Hypertension Father    Heart disease Father    Heart attack Father    Diabetes Father    Cancer Sister        lung   COPD Sister    COPD Sister    Heart attack Brother    Diabetes Brother    Hypertension Brother    Heart disease Brother    Heart attack Brother    Diabetes Brother    Hypertension Brother    Heart disease Brother    Heart attack Brother    Diabetes Brother    Hypertension Brother    Heart disease Brother    Colon cancer Brother 60   Lung cancer Brother    Breast cancer Neg Hx     Allergies  Allergen Reactions   Hydrocodone  Other (See Comments)    Unresponsive   Aleve [Naproxen Sodium]     Hives & itching    Contrast Media [Iodinated Contrast Media]     Pt avoids due to kidney issues   Ioxaglate Other (See Comments)    (iodine ) Pt avoids due to kidney issues    Oxycodone  Other (See Comments)    hallucinations   Ranexa [Ranolazine]     Sick on stomach   Sulfa Antibiotics Other (See Comments)    Pt avoids due to kidney issues   Tramadol Other (See Comments)    nightmares Other reaction(s): Hallucination, Other (See Comments) nightmares        Latest Ref Rng & Units 04/30/2023    2:27 PM 04/13/2023    1:37 PM 09/04/2022    2:49 PM  CBC  WBC 4.0 - 10.5 K/uL 9.0  9.6  9.7   Hemoglobin 12.0 - 15.0 g/dL 86.4  85.9  85.9   Hematocrit 36.0 - 46.0 % 43.2  44.2  43.0   Platelets 150 - 400 K/uL 228  244  296       CMP     Component Value Date/Time   NA 141 09/04/2023 1517   NA 140 09/20/2011 1134   K 4.9 09/04/2023 1517   K 4.4 09/20/2011 1134   CL 104 09/04/2023 1517   CL 103 09/20/2011 1134   CO2 20 09/04/2023 1517   CO2 28 09/20/2011 1134   GLUCOSE 117 (H) 09/04/2023 1517   GLUCOSE 72 04/30/2023 1427   GLUCOSE 139 (H) 09/20/2011 1134   BUN 29 (H) 09/04/2023 1517   BUN 17 09/20/2011 1134   CREATININE 1.60 (H) 09/04/2023 1517   CREATININE 1.77 (H)  04/30/2023 1427   CREATININE 1.31 (H) 09/20/2011 1134   CALCIUM  9.9 09/04/2023 1517   CALCIUM  8.4 (L) 09/20/2011 1134   PROT 7.1 04/13/2023 1337   ALBUMIN 4.4 04/13/2023 1337   AST 59 (H) 04/13/2023 1337   ALT 53 (H) 04/13/2023 1337   ALKPHOS 96 04/13/2023  1337   BILITOT 0.5 04/13/2023 1337   EGFR 32 (L) 09/04/2023 1517   GFRNONAA 29 (L) 04/30/2023 1427   GFRNONAA 42 (L) 09/20/2011 1134     No results found.     Assessment & Plan:   1. Peripheral arterial disease with history of revascularization (HCC) (Primary)  Recommend:  The patient has evidence of atherosclerosis of the lower extremities.The patient does not voice lifestyle limiting changes at this point in time.  Noninvasive studies do not suggest clinically significant change.  No invasive studies, angiography or surgery at this time The patient should continue walking and begin a more formal exercise program.  The patient should continue antiplatelet therapy and aggressive treatment of the lipid abnormalities  No changes in the patient's medications at this time  Continued surveillance is indicated as atherosclerosis is likely to progress with time.    The patient will continue follow up with noninvasive studies as ordered.   2. Essential hypertension Continue antihypertensive medications as already ordered, these medications have been reviewed and there are no changes at this time.  3. Type 2 diabetes mellitus with stage 3 chronic kidney disease, with long-term current use of insulin , unspecified whether stage 3a or 3b CKD (HCC) Continue hypoglycemic medications as already ordered, these medications have been reviewed and there are no changes at this time.  Hgb A1C to be monitored as already arranged by primary service  4. Chronic low back pain (1ry area of Pain) (Bilateral) (R>L) w/o sciatica Based upon the patient's description of symptoms as well as her noninvasive studies today I suspect much of the patient  experiencing in her right lower extremity related to her back versus her arterial status.   Current Outpatient Medications on File Prior to Visit  Medication Sig Dispense Refill   acetaminophen  (TYLENOL ) 500 MG tablet Take 500 mg by mouth every 6 (six) hours as needed for moderate pain. Takes up to 3 tablets/day PRN for increased pain     albuterol  (VENTOLIN  HFA) 108 (90 Base) MCG/ACT inhaler Inhale 2 puffs into the lungs every 6 (six) hours as needed for wheezing or shortness of breath. 18 g 3   anastrozole  (ARIMIDEX ) 1 MG tablet TAKE 1 TABLET BY MOUTH ONCE DAILY 90 tablet 1   apixaban  (ELIQUIS ) 2.5 MG TABS tablet Take 1 tablet (2.5 mg total) by mouth 2 (two) times daily. 180 tablet 1   Calcium  Carb-Cholecalciferol  (CALCIUM  600 + D) 600-200 MG-UNIT TABS Take 1 tablet by mouth daily.      cephALEXin  (KEFLEX ) 500 MG capsule Take 500 mg by mouth daily.     cetirizine  (ZYRTEC ) 10 MG tablet TAKE 1 TABLET BY MOUTH DAILY 30 tablet 12   cholecalciferol  (VITAMIN D3) 25 MCG (1000 UT) tablet Take 1,000 Units by mouth daily.     Continuous Glucose Receiver (FREESTYLE LIBRE 3 READER) DEVI by Does not apply route.     Continuous Glucose Sensor (FREESTYLE LIBRE 3 PLUS SENSOR) MISC 1 each by Does not apply route. Change sensor every 15 days.     CRANBERRY PO Take 2 capsules by mouth daily.     DULoxetine  (CYMBALTA ) 60 MG capsule TAKE 2 CAPSULES BY MOUTH ONCE DAILY 180 capsule 1   fluticasone  (FLONASE ) 50 MCG/ACT nasal spray Place 1 spray into both nostrils daily. 16 g 2   Fluticasone -Umeclidin-Vilant (TRELEGY ELLIPTA ) 100-62.5-25 MCG/ACT AEPB INHALE 1 PUFF INTO THE LUNGS DAILY 180 each 0   gabapentin  (NEURONTIN ) 400 MG capsule Take 2 capsules (800 mg total) by mouth  3 (three) times daily. 540 capsule 0   isosorbide  mononitrate (IMDUR ) 30 MG 24 hr tablet Take 0.5 tablets (15 mg total) by mouth at bedtime.     levothyroxine  (SYNTHROID ) 125 MCG tablet Take 1 tablet (125 mcg total) by mouth daily before breakfast.  30 tablet 2   MYRBETRIQ  50 MG TB24 tablet TAKE 1 TABLET BY MOUTH DAILY 30 tablet 2   nitroGLYCERIN  (NITROSTAT ) 0.4 MG SL tablet Place 1 tablet (0.4 mg total) under the tongue every 5 (five) minutes as needed for chest pain. Total of 3 doses 25 tablet 6   ondansetron  (ZOFRAN ) 4 MG tablet Take 1 tablet (4 mg total) by mouth every 8 (eight) hours as needed for nausea or vomiting. 20 tablet 6   pantoprazole  (PROTONIX ) 40 MG tablet TAKE 1 TABLET BY MOUTH ONCE DAILY 90 tablet 1   Pumpkin Seed-Soy Germ (AZO BLADDER CONTROL/GO-LESS PO) Take 1 tablet by mouth in the morning and at bedtime.     rosuvastatin  (CRESTOR ) 40 MG tablet Take 1 tablet (40 mg total) by mouth daily. 90 tablet 1   tirzepatide  (MOUNJARO ) 10 MG/0.5ML Pen Inject 10 mg into the skin once a week. 6 mL 1   ZINC  CITRATE PO Take 1 tablet by mouth daily.     montelukast  (SINGULAIR ) 10 MG tablet TAKE 1 TABLET BY MOUTH AT BEDTIME 90 tablet 0   No current facility-administered medications on file prior to visit.    There are no Patient Instructions on file for this visit. No follow-ups on file.   Antanasia Kaczynski E Shaily Librizzi, NP

## 2023-09-10 NOTE — Progress Notes (Unsigned)
 Cardiology Clinic Note   Date: 09/11/2023 ID: TAHJ LINDSETH, DOB Jan 21, 1946, MRN [id number]  Primary Cardiologist:  Evalene Lunger, MD  Chief Complaint   Tammy [name] is a 78 y.o. female who presents to the clinic today for routine follow up.   Patient Profile   Tammy [name] is followed by Dr. Gollan for the history outlined below.      Past medical history significant for: CAD. LHC 01/04/2011 (angina): Proximal LAD 30%.  Mid LAD 40%.  Mid LCx 40%.  Proximal RCA 80%.  Mid RCA 100% with good collaterals from distal LAD.  Recommend medical management. LHC 12/07/2011 (angina): Mid LAD 50%.  Distal LAD 50%.  Proximal RCA 70%.  Mid RCA known CTO. LHC 04/04/2019 (angina): Proximal to mid RCA 100% with left-to-right collaterals.  Mid LAD 45%.  Stable disease when compared to September 2013. R/LHC 09/26/2021 (exertional dyspnea): Mid LAD 45%.  Proximal to mid RCA 100% with well-developed left-to-right collaterals.  Proximal RCA 90%.  No significant change from January 2021.  Recommend continuing aggressive medical therapy.  Right heart catheterization showed mildly elevated filling pressures, mild pulmonary hypertension and normal cardiac output. Echo 03/10/2022: EF 55 to 60%.  No RWMA.  Normal diastolic parameters.  Normal RV size/function.  No significant valvular abnormalities. PSVT. 14-day ZIO 12/29/2022: Heart rate 65 to 160 bpm, average 82 bpm.  561 nonsustained runs of SVT longest 19 beats.  Rare ectopy.  No sustained arrhythmias.  No A-fib. PAD. Aortogram and lower extremity angiogram 10/23/2022: PTA right tibioperoneal trunk and most proximal posterior tibial artery.  PTA right SFA and popliteal arteries.  Stent placement right SFA and most proximal popliteal artery. ABI 11/29/2022: Right ABI/TBI within normal range.  Mild left lower extremity arterial disease. Abdominal aortic atherosclerosis. Carotid artery disease. Carotid duplex 08/13/2014: Bilateral ICA 1 to  39%. Hypertension. Hyperlipidemia. Lipid panel 04/13/2023: LDL 76, HDL 61, TG 252, total 178.  PE/DVT. COPD. T2DM. Hypothyroidism.   CKD stage IIIb.  In summary, patient has a history of CAD with CTO of RCA dating back to October 2012 as detailed above.  She had repeat catheterization in September 2013 which showed stable coronary disease. Echo at that time showed normal LV function, mild inferior hypokinesis suggestive of CAD, mild LVH, mild diastolic dysfunction, normal PA pressure.  In January 2021 she complained of chest pain worsened with exertion and relieved with NTG.  She underwent LHC which showed stable disease when compared to catheterization in September 2013.  In July 2023 patient complained of most severe episode of chest pain resolved with several NTG.  She underwent heart catheterization which was unchanged from prior catheterization.  Aggressive medical therapy was recommended.  Echo December 2023 showed normal LV/RV function as detailed above.  Patient underwent lower extremity intervention with VVS in August 2024 as detailed above.   Patient was last seen in the office by me on 04/03/2023 for routine follow up. She was doing well at that time with occasional brief episodes of chest discomfort that resolved on its own and typically occurred without exertion. She attributed chest pain to poorly healed rib fractures.  She reported occasional palpitations occurring in the morning and lasting a few minutes before resolving. BP had been trending down. BP at the time of her visit 100/60. Isosorbide  was decreased.      History of Present Illness    Today, patient is accompanied by her son. She has several complaints today. She reports sharp discomfort starting in right breast  and moves across to her left breast occurring daily and lasting hours. She has a history of breast cancer with lumpectomy in 2020 and wonders if discomfort could be related to that. She plans on inquiring about a  repeat mammogram. She reports palpitations associated with the chest discomfort. She was instructed by PCP to stop Toprol  secondary to low BP. She is unsure when but feels it was a few weeks ago. She is not sure if palpitations increased since that time. She wears a smart watch and reports she will get alerted when heart rate is elevated. She states fastest heart rate she has seen was 101 bpm. She reports lightheadedness the last couple of days. It was at its worst yesterday and a little bit better today. She denies lower extremity edema, shortness of breath, orthopnea or PND.     ROS: All other systems reviewed and are otherwise negative except as noted in History of Present Illness.  EKGs/Labs Reviewed       EKG is nor ordered today.   04/13/2023: ALT 53; AST 59 09/04/2023: BUN 29; Creatinine, Ser 1.60; Potassium 4.9; Sodium 141   04/30/2023: Hemoglobin 13.5; WBC Count 9.0   08/27/2023: TSH 55.800    Physical Exam    VS:  BP 96/61   Pulse 81   Ht 5' 6 (1.676 m)   Wt 176 lb 9.6 oz (80.1 kg)   SpO2 94%   BMI 28.50 kg/m  , BMI Body mass index is 28.5 kg/m.  GEN: Well nourished, well developed, in no acute distress. Neck: No JVD or carotid bruits. Cardiac:  RRR. No murmur. No rubs or gallops.   Respiratory:  Respirations regular and unlabored. Clear to auscultation without rales, wheezing or rhonchi. GI: Soft, nontender, nondistended. Extremities: Radials/DP/PT 2+ and equal bilaterally. No clubbing or cyanosis. No edema.  Skin: Warm and dry, no rash. Neuro: Strength intact.  Assessment & Plan   CAD LHC x 4 (2012, 2013, 2021, 2023) showing mild to moderate stenosis in LAD and CTO of RCA.  Patient reports sharp right breast pain that moves across to left breast occurring daily and lasting hours. She is concerned that it could be related to breast cancer/lumpectomy history and plans on requesting a mammogram. Discussed potential ischemic evaluation and patient states I don't think  I need that right now.  - Defer ischemic evaluation. Patient is instructed to contact the office if she continues to have chest pain or develops dyspnea.  - Continue rosuvastatin , as needed SL NTG.  Patient not on aspirin  secondary to Eliquis . - Restart metoprolol  (see below).    Palpitations/PSVT 14-day ZIO October 2024 ordered by PCP showed 561 runs of PSVT.  Patient reports increased palpitations. She reports PCP stopped metoprolol  secondary to hypotension but is unsure if increased palpitations correlates to when she stopped it. She wears a smart watch and gets alerts if her heart rate is elevated. Highest rate 101 bpm. RRR on exam today HR 81 bpm.  - Restart metoprolol .    PAD S/p PTA and stent placement right lower extremity August 2024.  Patient reports R>L lower extremity pain mainly in her knee. She has a follow up soon with ortho. No edema today.  -Continue rosuvastatin  and Eliquis . -Followed by VVS.   Hypertension BP today 96/61. Patient reports metoprolol  was stopped secondary to low BP at PCP's office. She reports lightheadedness the last 2 days. Will stop isosorbide  and restart metoprolol .   - Stop isosorbide  and restart metoprolol .   Lightheadedness Patient  reports recent lightheadedness worse in the last 2 days. Episodes occur with position changes and while walking. Discussed obtaining a carotid US  for further evaluation. Patient would prefer to see if BP improves with stopping isosorbide  prior to pursing testing, as she is the caregiver for her husband and would prefer to limit appointments.  - Defer carotid US  at this time. She is instructed to contact office if lightheadedness continues.   Hyperlipidemia LDL 76 January 2025, not at goal.  -Continue rosuvastatin .   History of PE/DVT No report of shortness of breath or DOE. Denies spontaneous bleeding concerns.  -Continue Eliquis . -Anticoagulation managed by PCP.  Disposition: Stop isosorbide . Restart metoprolol .  Return in 3 months or sooner as needed.          Signed, Barnie HERO. Jahziel Sinn, DNP, NP-C

## 2023-09-11 ENCOUNTER — Encounter: Payer: Self-pay | Admitting: Student

## 2023-09-11 ENCOUNTER — Ambulatory Visit: Payer: Self-pay | Admitting: Emergency Medicine

## 2023-09-11 ENCOUNTER — Ambulatory Visit: Attending: Student | Admitting: Student

## 2023-09-11 ENCOUNTER — Telehealth: Payer: Self-pay | Admitting: Emergency Medicine

## 2023-09-11 VITALS — BP 96/61 | HR 81 | Ht 66.0 in | Wt 176.6 lb

## 2023-09-11 DIAGNOSIS — Z86718 Personal history of other venous thrombosis and embolism: Secondary | ICD-10-CM | POA: Insufficient documentation

## 2023-09-11 DIAGNOSIS — I471 Supraventricular tachycardia, unspecified: Secondary | ICD-10-CM | POA: Insufficient documentation

## 2023-09-11 DIAGNOSIS — Z86711 Personal history of pulmonary embolism: Secondary | ICD-10-CM | POA: Insufficient documentation

## 2023-09-11 DIAGNOSIS — R002 Palpitations: Secondary | ICD-10-CM | POA: Insufficient documentation

## 2023-09-11 DIAGNOSIS — R42 Dizziness and giddiness: Secondary | ICD-10-CM | POA: Insufficient documentation

## 2023-09-11 DIAGNOSIS — I2510 Atherosclerotic heart disease of native coronary artery without angina pectoris: Secondary | ICD-10-CM | POA: Diagnosis not present

## 2023-09-11 DIAGNOSIS — I10 Essential (primary) hypertension: Secondary | ICD-10-CM | POA: Insufficient documentation

## 2023-09-11 DIAGNOSIS — I739 Peripheral vascular disease, unspecified: Secondary | ICD-10-CM | POA: Diagnosis not present

## 2023-09-11 MED ORDER — METOPROLOL SUCCINATE ER 25 MG PO TB24: 12.5000 mg | ORAL_TABLET | Freq: Every day | ORAL | Status: DC

## 2023-09-11 NOTE — Telephone Encounter (Signed)
 Called and spoke with the patient seeking clarification of which metoprolol  (tartrate or succinate) and dosage.  Patient stated that she is not home at the moment, but she if almost sure its the succinate.  Asked the patient to look at the prescription bottle, verify that and the dosage and call us  with that information tomorrow.  Patient agreed to do so and will call back tomorrow with that information.

## 2023-09-11 NOTE — Telephone Encounter (Signed)
-----   Message from Barnie Hila sent at 09/11/2023  4:06 PM EDT ----- Abby,   Will you please contact this patient tomorrow and clarify which metoprolol  she was taking previously and at what dose? If she was taking Toprol  XL she should take 12.5 mg at night. If she was taking metoprolol  tartrate can you determine which dose (if 50 mg I would like her to take 25 mg bid and if 25 mg I would like her to take 12.5 mg twice a day). She is to let us  know if low BP and lightheadedness continues.   Thank you!  DW

## 2023-09-11 NOTE — Telephone Encounter (Signed)
 Called the patient to clarify which Metoprolol  she was taking and the dose that she was taking.  Patient did not answer the call.  Voicemail left with request for the patient to call back.  Telephone number provided.

## 2023-09-11 NOTE — Telephone Encounter (Signed)
 Patient is returning call.

## 2023-09-11 NOTE — Patient Instructions (Signed)
 Medication Instructions:  Your physician recommends the following medication changes.  STOP TAKING: Isosorbide  Mononitrate  START TAKING: Metoprolol  succinate (Toprol  -XL) 12.5 mg (1/2 of 25 mg tablet) once daily in the morning *If you need a refill on your cardiac medications before your next appointment, please call your pharmacy*  Lab Work: None ordered at this time  If you have labs (blood work) drawn today and your tests are completely normal, you will receive your results only by: MyChart Message (if you have MyChart) OR A paper copy in the mail If you have any lab test that is abnormal or we need to change your treatment, we will call you to review the results.  Testing/Procedures: None ordered at this time   Follow-Up: At [facility], you and your health needs are our priority.  As part of our continuing mission to provide you with exceptional heart care, our providers are all part of one team.  This team includes your primary Cardiologist (physician) and Advanced Practice Providers or APPs (Physician Assistants and Nurse Practitioners) who all work together to provide you with the care you need, when you need it.  Your next appointment:   3 month(s)  Provider:   You may see Timothy Gollan, MD or one of the following Advanced Practice Providers on your designated Care Team:   Lonni Meager, NP Lesley Maffucci, PA-C Bernardino Bring, PA-C Cadence Waterloo, PA-C Tylene Lunch, NP Barnie Hila, NP    We recommend signing up for the patient portal called MyChart.  Sign up information is provided on this After Visit Summary.  MyChart is used to connect with patients for Virtual Visits (Telemedicine).  Patients are able to view lab/test results, encounter notes, upcoming appointments, etc.  Non-urgent messages can be sent to your provider as well.   To learn more about what you can do with MyChart, go to [email or web address].

## 2023-09-12 ENCOUNTER — Telehealth: Payer: Self-pay | Admitting: Emergency Medicine

## 2023-09-12 LAB — VAS US ABI WITH/WO TBI
Left ABI: 0.93
Right ABI: 0.98

## 2023-09-12 MED ORDER — METOPROLOL TARTRATE 25 MG PO TABS
25.0000 mg | ORAL_TABLET | Freq: Two times a day (BID) | ORAL | 3 refills | Status: DC
Start: 2023-09-12 — End: 2023-11-09

## 2023-09-12 NOTE — Telephone Encounter (Signed)
 Called and spoke with the patient. Patient states that she is taking Metoprolol  tartrate 50 mg BID. Patient advised to take 25 mg BID. Medication changes made in the chart to reflect the correct dosage and medication.

## 2023-09-12 NOTE — Telephone Encounter (Signed)
 Called and spoke with the patient to inquire about the which Metoprolol  she is taking and what is the dosage. Patient states that she's not even up yet and would like a call back in about an hour (around 9 am). Will call back in about an hour.

## 2023-10-08 DIAGNOSIS — E1122 Type 2 diabetes mellitus with diabetic chronic kidney disease: Secondary | ICD-10-CM | POA: Diagnosis not present

## 2023-10-08 DIAGNOSIS — N1832 Chronic kidney disease, stage 3b: Secondary | ICD-10-CM | POA: Diagnosis not present

## 2023-10-08 DIAGNOSIS — E875 Hyperkalemia: Secondary | ICD-10-CM | POA: Diagnosis not present

## 2023-10-08 DIAGNOSIS — R809 Proteinuria, unspecified: Secondary | ICD-10-CM | POA: Diagnosis not present

## 2023-10-08 DIAGNOSIS — I129 Hypertensive chronic kidney disease with stage 1 through stage 4 chronic kidney disease, or unspecified chronic kidney disease: Secondary | ICD-10-CM | POA: Diagnosis not present

## 2023-10-11 ENCOUNTER — Other Ambulatory Visit: Payer: Self-pay | Admitting: Family Medicine

## 2023-10-12 NOTE — Telephone Encounter (Signed)
 Requested Prescriptions  Pending Prescriptions Disp Refills   pantoprazole  (PROTONIX ) 40 MG tablet [Pharmacy Med Name: PANTOPRAZOLE  SODIUM 40 MG DR TAB] 90 tablet 1    Sig: TAKE 1 TABLET BY MOUTH ONCE DAILY     Gastroenterology: Proton Pump Inhibitors Passed - 10/12/2023 12:31 PM      Passed - Valid encounter within last 12 months    Recent Outpatient Visits           1 month ago Essential hypertension   [place] [facility] [place], Megan P, DO   1 month ago Chronic bilateral low back pain without sciatica   [place] [facility] [place], Megan P, DO   2 months ago Acute cystitis with hematuria   [place] [facility] [place], Megan P, DO   3 months ago Type 2 diabetes mellitus with diabetic peripheral angiopathy without gangrene, without long-term current use of insulin  [facility])   [place] [facility] [place], Megan P, DO   3 months ago Hypoglycemia   [place] [facility] [place] Duwaine SQUIBB, DO       Future Appointments             In 2 months Wittenborn, Barnie, NP [facility]

## 2023-10-16 ENCOUNTER — Ambulatory Visit
Admission: RE | Admit: 2023-10-16 | Discharge: 2023-10-16 | Disposition: A | Discharge status: Home / Self Care | Payer: Medicare Other | Source: Ambulatory Visit | Attending: Internal Medicine | Admitting: Internal Medicine

## 2023-10-16 DIAGNOSIS — R918 Other nonspecific abnormal finding of lung field: Secondary | ICD-10-CM | POA: Diagnosis not present

## 2023-10-16 DIAGNOSIS — C3412 Malignant neoplasm of upper lobe, left bronchus or lung: Secondary | ICD-10-CM | POA: Insufficient documentation

## 2023-10-16 DIAGNOSIS — R079 Chest pain, unspecified: Secondary | ICD-10-CM | POA: Diagnosis not present

## 2023-10-23 ENCOUNTER — Ambulatory Visit (INDEPENDENT_AMBULATORY_CARE_PROVIDER_SITE_OTHER): Admitting: Family Medicine

## 2023-10-23 ENCOUNTER — Other Ambulatory Visit: Payer: Self-pay

## 2023-10-23 ENCOUNTER — Encounter: Payer: Self-pay | Admitting: Family Medicine

## 2023-10-23 VITALS — BP 108/72 | HR 60 | Temp 97.6°F | Ht 66.0 in

## 2023-10-23 DIAGNOSIS — E1151 Type 2 diabetes mellitus with diabetic peripheral angiopathy without gangrene: Secondary | ICD-10-CM

## 2023-10-23 DIAGNOSIS — M545 Low back pain, unspecified: Secondary | ICD-10-CM

## 2023-10-23 DIAGNOSIS — G8929 Other chronic pain: Secondary | ICD-10-CM | POA: Diagnosis not present

## 2023-10-23 DIAGNOSIS — I2699 Other pulmonary embolism without acute cor pulmonale: Secondary | ICD-10-CM | POA: Diagnosis not present

## 2023-10-23 LAB — BAYER DCA HB A1C WAIVED: HB A1C (BAYER DCA - WAIVED): 5.8 % — ABNORMAL HIGH (ref 4.8–5.6)

## 2023-10-23 MED ORDER — TIRZEPATIDE 7.5 MG/0.5ML ~~LOC~~ SOAJ
7.5000 mg | SUBCUTANEOUS | 1 refills | Status: AC
  Filled 2023-10-23 – 2024-02-06 (×3): qty 2, 28d supply, fill #0
  Filled 2024-03-08: qty 2, 28d supply, fill #1

## 2023-10-23 MED ORDER — TRAMADOL HCL 50 MG PO TABS
50.0000 mg | ORAL_TABLET | Freq: Three times a day (TID) | ORAL | 0 refills | Status: AC | PRN
Start: 2023-10-23 — End: 2023-10-28

## 2023-10-23 NOTE — Progress Notes (Signed)
 BP 108/72   Pulse 60   Temp 97.6 F (36.4 C) (Oral)   Ht 5' 6 (1.676 m)   SpO2 94%   BMI 28.50 kg/m    Subjective:    Patient ID: Tammy [name], female    DOB: 1946-03-08, 78 y.o.   MRN: [id number]  HPI: Tammy [name] is a 78 y.o. female  Chief Complaint  Patient presents with   Diabetes   DIABETES Hypoglycemic episodes:yes Polydipsia/polyuria: no Visual disturbance: no Chest pain: no Paresthesias: no Glucose Monitoring: yes  Accucheck frequency: continuous Taking Insulin ?: no Blood Pressure Monitoring: not checking Retinal Examination: Not up to Date Foot Exam: Up to Date Diabetic Education: Completed Pneumovax: Up to Date Influenza: Not up to Date Aspirin : no  BACK PAIN Duration: chronic Mechanism of injury: no trauma Location: bilateral low back Onset: gradual Severity: severe Quality: aching, sharp, shooting, numb Frequency: constant Radiation: buttocks  Aggravating factors: sitting, moving, lifting Alleviating factors: nothing Status: worse Treatments attempted: PT, surgery, injections, medication  Relief with NSAIDs?: mild Nighttime pain:  no Paresthesias / decreased sensation:  yes Bowel / bladder incontinence:  no Fevers:  no Dysuria / urinary frequency:  no   Relevant past medical, surgical, family and social history reviewed and updated as indicated. Interim medical history since our last visit reviewed. Allergies and medications reviewed and updated.  Review of Systems  Constitutional:  Positive for fatigue. Negative for activity change, appetite change, chills, diaphoresis, fever and unexpected weight change.  Respiratory: Negative.    Cardiovascular: Negative.   Musculoskeletal:  Positive for back pain and myalgias. Negative for arthralgias, gait problem, joint swelling, neck pain and neck stiffness.  Skin: Negative.   Neurological: Negative.   Psychiatric/Behavioral: Negative.      Per HPI unless specifically indicated above      Objective:    BP 108/72   Pulse 60   Temp 97.6 F (36.4 C) (Oral)   Ht 5' 6 (1.676 m)   SpO2 94%   BMI 28.50 kg/m   Wt Readings from Last 3 Encounters:  09/11/23 176 lb 9.6 oz (80.1 kg)  09/10/23 176 lb 6 oz (80 kg)  09/04/23 180 lb (81.6 kg)    Physical Exam Vitals and nursing note reviewed.  Constitutional:      General: She is not in acute distress.    Appearance: Normal appearance. She is obese. She is not ill-appearing, toxic-appearing or diaphoretic.  HENT:     Head: Normocephalic and atraumatic.     Right Ear: External ear normal.     Left Ear: External ear normal.     Nose: Nose normal.     Mouth/Throat:     Mouth: Mucous membranes are moist.     Pharynx: Oropharynx is clear.  Eyes:     General: No scleral icterus.       Right eye: No discharge.        Left eye: No discharge.     Extraocular Movements: Extraocular movements intact.     Conjunctiva/sclera: Conjunctivae normal.     Pupils: Pupils are equal, round, and reactive to light.  Cardiovascular:     Rate and Rhythm: Normal rate and regular rhythm.     Pulses: Normal pulses.     Heart sounds: Normal heart sounds. No murmur heard.    No friction rub. No gallop.  Pulmonary:     Effort: Pulmonary effort is normal. No respiratory distress.     Breath sounds: Normal breath sounds. No stridor.  No wheezing, rhonchi or rales.  Chest:     Chest wall: No tenderness.  Musculoskeletal:        General: Normal range of motion.     Cervical back: Normal range of motion and neck supple.  Skin:    General: Skin is warm and dry.     Capillary Refill: Capillary refill takes less than 2 seconds.     Coloration: Skin is not jaundiced or pale.     Findings: No bruising, erythema, lesion or rash.  Neurological:     General: No focal deficit present.     Mental Status: She is alert and oriented to person, place, and time. Mental status is at baseline.  Psychiatric:        Mood and Affect: Mood normal.         Behavior: Behavior normal.        Thought Content: Thought content normal.        Judgment: Judgment normal.     Results for orders placed or performed in visit on 10/23/23  Bayer DCA Hb A1c Waived   Collection Time: 10/23/23  3:35 PM  Result Value Ref Range   HB A1C (BAYER DCA - WAIVED) 5.8 (H) 4.8 - 5.6 %   *Note: Due to a large number of results and/or encounters for the requested time period, some results have not been displayed. A complete set of results can be found in Results Review.      Assessment & Plan:   Problem List Items Addressed This Visit       Cardiovascular and Mediastinum   Pulmonary embolism (HCC)   Labs drawn today. Await results.       Relevant Orders   CBC with Differential/Platelet   Type 2 diabetes mellitus with diabetic peripheral angiopathy without gangrene (HCC) - Primary   Continuing to have episodes of hypoglycemia. A1c stable at 5.8 will further reduce her mounjaro  to 7.5 and recheck in 1 month- may need to continue to titrate down.       Relevant Medications   tirzepatide  (MOUNJARO ) 7.5 MG/0.5ML Pen   Other Relevant Orders   Bayer DCA Hb A1c Waived (Completed)   Comprehensive metabolic panel with GFR     Other   Chronic low back pain (1ry area of Pain) (Bilateral) (R>L) w/o sciatica (Chronic)   Not doing well. Will get her into neurosurgery. Short course of pain medicine sent to her pharmacy.       Relevant Medications   traMADol  (ULTRAM ) 50 MG tablet   Other Relevant Orders   Ambulatory referral to Neurosurgery     Follow up plan: Return in about 4 weeks (around 11/20/2023) for OK to use same day if need be.

## 2023-10-23 NOTE — Assessment & Plan Note (Signed)
 Continuing to have episodes of hypoglycemia. A1c stable at 5.8 will further reduce her mounjaro  to 7.5 and recheck in 1 month- may need to continue to titrate down.

## 2023-10-23 NOTE — Patient Instructions (Addendum)
 Dr. Theresia Hails [facility] [address] [place], [place] [address] Office: [phone]

## 2023-10-23 NOTE — Assessment & Plan Note (Signed)
 Not doing well. Will get her into neurosurgery. Short course of pain medicine sent to her pharmacy.

## 2023-10-23 NOTE — Assessment & Plan Note (Signed)
 Labs drawn today. Await results.

## 2023-10-24 LAB — CBC WITH DIFFERENTIAL/PLATELET
Basophils Absolute: 0 x10E3/uL (ref 0.0–0.2)
Basos: 1 %
EOS (ABSOLUTE): 0.2 x10E3/uL (ref 0.0–0.4)
Eos: 2 %
Hematocrit: 39.8 % (ref 34.0–46.6)
Hemoglobin: 12.9 g/dL (ref 11.1–15.9)
Immature Grans (Abs): 0 x10E3/uL (ref 0.0–0.1)
Immature Granulocytes: 0 %
Lymphocytes Absolute: 2 x10E3/uL (ref 0.7–3.1)
Lymphs: 30 %
MCH: 30.3 pg (ref 26.6–33.0)
MCHC: 32.4 g/dL (ref 31.5–35.7)
MCV: 93 fL (ref 79–97)
Monocytes Absolute: 0.8 x10E3/uL (ref 0.1–0.9)
Monocytes: 11 %
Neutrophils Absolute: 3.7 x10E3/uL (ref 1.4–7.0)
Neutrophils: 55 %
Platelets: 224 x10E3/uL (ref 150–450)
RBC: 4.26 x10E6/uL (ref 3.77–5.28)
RDW: 12.8 % (ref 11.7–15.4)
WBC: 6.7 x10E3/uL (ref 3.4–10.8)

## 2023-10-24 LAB — COMPREHENSIVE METABOLIC PANEL WITH GFR
ALT: 21 IU/L (ref 0–32)
AST: 23 IU/L (ref 0–40)
Albumin: 3.8 g/dL (ref 3.8–4.8)
Alkaline Phosphatase: 74 IU/L (ref 44–121)
BUN/Creatinine Ratio: 19 (ref 12–28)
BUN: 26 mg/dL (ref 8–27)
Bilirubin Total: 0.3 mg/dL (ref 0.0–1.2)
CO2: 21 mmol/L (ref 20–29)
Calcium: 9.5 mg/dL (ref 8.7–10.3)
Chloride: 104 mmol/L (ref 96–106)
Creatinine, Ser: 1.34 mg/dL — ABNORMAL HIGH (ref 0.57–1.00)
Globulin, Total: 2.3 g/dL (ref 1.5–4.5)
Glucose: 65 mg/dL — ABNORMAL LOW (ref 70–99)
Potassium: 4.8 mmol/L (ref 3.5–5.2)
Sodium: 140 mmol/L (ref 134–144)
Total Protein: 6.1 g/dL (ref 6.0–8.5)
eGFR: 40 mL/min/1.73 — ABNORMAL LOW (ref >59)

## 2023-10-28 ENCOUNTER — Ambulatory Visit: Payer: Self-pay | Admitting: Family Medicine

## 2023-10-29 DIAGNOSIS — M549 Dorsalgia, unspecified: Secondary | ICD-10-CM | POA: Diagnosis not present

## 2023-10-29 DIAGNOSIS — M545 Low back pain, unspecified: Secondary | ICD-10-CM | POA: Diagnosis not present

## 2023-10-29 DIAGNOSIS — M5134 Other intervertebral disc degeneration, thoracic region: Secondary | ICD-10-CM | POA: Diagnosis not present

## 2023-10-29 DIAGNOSIS — M4030 Flatback syndrome, site unspecified: Secondary | ICD-10-CM | POA: Diagnosis not present

## 2023-10-29 DIAGNOSIS — Z981 Arthrodesis status: Secondary | ICD-10-CM | POA: Diagnosis not present

## 2023-10-29 DIAGNOSIS — M79604 Pain in right leg: Secondary | ICD-10-CM | POA: Diagnosis not present

## 2023-10-29 DIAGNOSIS — M4312 Spondylolisthesis, cervical region: Secondary | ICD-10-CM | POA: Diagnosis not present

## 2023-10-29 DIAGNOSIS — M50322 Other cervical disc degeneration at C5-C6 level: Secondary | ICD-10-CM | POA: Diagnosis not present

## 2023-10-29 DIAGNOSIS — G8929 Other chronic pain: Secondary | ICD-10-CM | POA: Diagnosis not present

## 2023-10-29 DIAGNOSIS — M5136 Other intervertebral disc degeneration, lumbar region with discogenic back pain only: Secondary | ICD-10-CM | POA: Diagnosis not present

## 2023-10-29 DIAGNOSIS — M8588 Other specified disorders of bone density and structure, other site: Secondary | ICD-10-CM | POA: Diagnosis not present

## 2023-10-30 ENCOUNTER — Inpatient Hospital Stay: Payer: Medicare Other

## 2023-10-30 ENCOUNTER — Inpatient Hospital Stay: Payer: Medicare Other | Admitting: Internal Medicine

## 2023-10-30 ENCOUNTER — Other Ambulatory Visit: Payer: Self-pay | Admitting: Neurosurgery

## 2023-10-30 DIAGNOSIS — M545 Low back pain, unspecified: Secondary | ICD-10-CM

## 2023-10-30 DIAGNOSIS — M549 Dorsalgia, unspecified: Secondary | ICD-10-CM

## 2023-10-30 DIAGNOSIS — Z981 Arthrodesis status: Secondary | ICD-10-CM

## 2023-11-01 ENCOUNTER — Other Ambulatory Visit: Payer: Self-pay | Admitting: Family Medicine

## 2023-11-02 ENCOUNTER — Other Ambulatory Visit: Payer: Self-pay

## 2023-11-02 NOTE — Telephone Encounter (Signed)
 Requested Prescriptions  Refused Prescriptions Disp Refills   glipiZIDE  (GLUCOTROL ) 5 MG tablet [Pharmacy Med Name: GLIPIZIDE  5 MG TAB] 90 tablet     Sig: TAKE 1 TABLET BY MOUTH ONCE DAILY BEFOREBREAKFAST     Endocrinology:  Diabetes - Sulfonylureas Failed - 11/02/2023  2:56 PM      Failed - Cr in normal range and within 360 days    Creatinine  Date Value Ref Range Status  04/30/2023 1.77 (H) 0.44 - 1.00 mg/dL Final  92/89/7986 8.68 (H) 0.60 - 1.30 mg/dL Final   Creatinine, Ser  Date Value Ref Range Status  10/23/2023 1.34 (H) 0.57 - 1.00 mg/dL Final         Passed - HBA1C is between 0 and 7.9 and within 180 days    Hemoglobin A1C  Date Value Ref Range Status  11/23/2021 7.5  Final   HB A1C (BAYER DCA - WAIVED)  Date Value Ref Range Status  10/23/2023 5.8 (H) 4.8 - 5.6 % Final    Comment:             Prediabetes: 5.7 - 6.4          Diabetes: >6.4          Glycemic control for adults with diabetes: <7.0          Passed - Valid encounter within last 6 months    Recent Outpatient Visits           1 week ago Type 2 diabetes mellitus with diabetic peripheral angiopathy without gangrene, without long-term current use of insulin  (HCC)   [place] [facility] [place], Megan P, DO   1 month ago Essential hypertension   [place] [facility] [place], Megan P, DO   2 months ago Chronic bilateral low back pain without sciatica   [place] [facility] [place], Megan P, DO   2 months ago Acute cystitis with hematuria   [place] [facility] [place], Megan P, DO   3 months ago Type 2 diabetes mellitus with diabetic peripheral angiopathy without gangrene, without long-term current use of insulin  [facility])   [place] [facility] [place], Duwaine SQUIBB, DO       Future Appointments             In 1 month Wittenborn, Barnie, NP [place] [facility]

## 2023-11-03 ENCOUNTER — Encounter: Payer: Self-pay | Admitting: *Deleted

## 2023-11-03 ENCOUNTER — Emergency Department

## 2023-11-03 ENCOUNTER — Other Ambulatory Visit: Payer: Self-pay

## 2023-11-03 ENCOUNTER — Inpatient Hospital Stay
Admission: EM | Admit: 2023-11-03 | Discharge: 2023-11-05 | DRG: 690 | Disposition: A | Discharge status: Home Health | Attending: Internal Medicine | Admitting: Internal Medicine

## 2023-11-03 DIAGNOSIS — S199XXA Unspecified injury of neck, initial encounter: Secondary | ICD-10-CM | POA: Diagnosis not present

## 2023-11-03 DIAGNOSIS — M546 Pain in thoracic spine: Secondary | ICD-10-CM | POA: Diagnosis not present

## 2023-11-03 DIAGNOSIS — I6522 Occlusion and stenosis of left carotid artery: Secondary | ICD-10-CM | POA: Diagnosis not present

## 2023-11-03 DIAGNOSIS — Z96651 Presence of right artificial knee joint: Secondary | ICD-10-CM | POA: Diagnosis present

## 2023-11-03 DIAGNOSIS — I2510 Atherosclerotic heart disease of native coronary artery without angina pectoris: Secondary | ICD-10-CM | POA: Diagnosis present

## 2023-11-03 DIAGNOSIS — G8929 Other chronic pain: Secondary | ICD-10-CM | POA: Diagnosis present

## 2023-11-03 DIAGNOSIS — Y92009 Unspecified place in unspecified non-institutional (private) residence as the place of occurrence of the external cause: Secondary | ICD-10-CM

## 2023-11-03 DIAGNOSIS — M1712 Unilateral primary osteoarthritis, left knee: Secondary | ICD-10-CM | POA: Diagnosis not present

## 2023-11-03 DIAGNOSIS — Z86711 Personal history of pulmonary embolism: Secondary | ICD-10-CM

## 2023-11-03 DIAGNOSIS — J4489 Other specified chronic obstructive pulmonary disease: Secondary | ICD-10-CM | POA: Diagnosis present

## 2023-11-03 DIAGNOSIS — E872 Acidosis, unspecified: Secondary | ICD-10-CM | POA: Diagnosis present

## 2023-11-03 DIAGNOSIS — J432 Centrilobular emphysema: Secondary | ICD-10-CM | POA: Diagnosis not present

## 2023-11-03 DIAGNOSIS — S299XXA Unspecified injury of thorax, initial encounter: Secondary | ICD-10-CM | POA: Diagnosis not present

## 2023-11-03 DIAGNOSIS — S3993XA Unspecified injury of pelvis, initial encounter: Secondary | ICD-10-CM | POA: Diagnosis not present

## 2023-11-03 DIAGNOSIS — R300 Dysuria: Secondary | ICD-10-CM

## 2023-11-03 DIAGNOSIS — I5032 Chronic diastolic (congestive) heart failure: Secondary | ICD-10-CM | POA: Diagnosis present

## 2023-11-03 DIAGNOSIS — Z8249 Family history of ischemic heart disease and other diseases of the circulatory system: Secondary | ICD-10-CM

## 2023-11-03 DIAGNOSIS — Z853 Personal history of malignant neoplasm of breast: Secondary | ICD-10-CM

## 2023-11-03 DIAGNOSIS — R918 Other nonspecific abnormal finding of lung field: Secondary | ICD-10-CM | POA: Diagnosis not present

## 2023-11-03 DIAGNOSIS — Z87891 Personal history of nicotine dependence: Secondary | ICD-10-CM

## 2023-11-03 DIAGNOSIS — Z86718 Personal history of other venous thrombosis and embolism: Secondary | ICD-10-CM

## 2023-11-03 DIAGNOSIS — M85862 Other specified disorders of bone density and structure, left lower leg: Secondary | ICD-10-CM | POA: Diagnosis not present

## 2023-11-03 DIAGNOSIS — W19XXXA Unspecified fall, initial encounter: Secondary | ICD-10-CM | POA: Diagnosis not present

## 2023-11-03 DIAGNOSIS — S0990XA Unspecified injury of head, initial encounter: Secondary | ICD-10-CM | POA: Diagnosis present

## 2023-11-03 DIAGNOSIS — J449 Chronic obstructive pulmonary disease, unspecified: Secondary | ICD-10-CM | POA: Diagnosis present

## 2023-11-03 DIAGNOSIS — Z7985 Long-term (current) use of injectable non-insulin antidiabetic drugs: Secondary | ICD-10-CM | POA: Diagnosis not present

## 2023-11-03 DIAGNOSIS — N1832 Chronic kidney disease, stage 3b: Secondary | ICD-10-CM | POA: Diagnosis present

## 2023-11-03 DIAGNOSIS — R262 Difficulty in walking, not elsewhere classified: Secondary | ICD-10-CM | POA: Insufficient documentation

## 2023-11-03 DIAGNOSIS — E1151 Type 2 diabetes mellitus with diabetic peripheral angiopathy without gangrene: Secondary | ICD-10-CM | POA: Diagnosis present

## 2023-11-03 DIAGNOSIS — Z8673 Personal history of transient ischemic attack (TIA), and cerebral infarction without residual deficits: Secondary | ICD-10-CM

## 2023-11-03 DIAGNOSIS — R509 Fever, unspecified: Secondary | ICD-10-CM | POA: Diagnosis not present

## 2023-11-03 DIAGNOSIS — E785 Hyperlipidemia, unspecified: Secondary | ICD-10-CM | POA: Diagnosis present

## 2023-11-03 DIAGNOSIS — Z7901 Long term (current) use of anticoagulants: Secondary | ICD-10-CM

## 2023-11-03 DIAGNOSIS — N390 Urinary tract infection, site not specified: Secondary | ICD-10-CM | POA: Diagnosis present

## 2023-11-03 DIAGNOSIS — E890 Postprocedural hypothyroidism: Secondary | ICD-10-CM | POA: Diagnosis present

## 2023-11-03 DIAGNOSIS — Z981 Arthrodesis status: Secondary | ICD-10-CM | POA: Diagnosis not present

## 2023-11-03 DIAGNOSIS — J9811 Atelectasis: Secondary | ICD-10-CM | POA: Diagnosis present

## 2023-11-03 DIAGNOSIS — R296 Repeated falls: Secondary | ICD-10-CM | POA: Diagnosis present

## 2023-11-03 DIAGNOSIS — Z85118 Personal history of other malignant neoplasm of bronchus and lung: Secondary | ICD-10-CM

## 2023-11-03 DIAGNOSIS — R9082 White matter disease, unspecified: Secondary | ICD-10-CM | POA: Diagnosis not present

## 2023-11-03 DIAGNOSIS — Z043 Encounter for examination and observation following other accident: Secondary | ICD-10-CM | POA: Diagnosis not present

## 2023-11-03 DIAGNOSIS — R531 Weakness: Secondary | ICD-10-CM

## 2023-11-03 DIAGNOSIS — N3000 Acute cystitis without hematuria: Secondary | ICD-10-CM | POA: Diagnosis not present

## 2023-11-03 DIAGNOSIS — M4312 Spondylolisthesis, cervical region: Secondary | ICD-10-CM | POA: Diagnosis not present

## 2023-11-03 DIAGNOSIS — M25562 Pain in left knee: Secondary | ICD-10-CM | POA: Diagnosis not present

## 2023-11-03 DIAGNOSIS — Z860100 Personal history of colon polyps, unspecified: Secondary | ICD-10-CM

## 2023-11-03 DIAGNOSIS — W1830XA Fall on same level, unspecified, initial encounter: Secondary | ICD-10-CM | POA: Diagnosis present

## 2023-11-03 DIAGNOSIS — Z79890 Hormone replacement therapy (postmenopausal): Secondary | ICD-10-CM

## 2023-11-03 DIAGNOSIS — B965 Pseudomonas (aeruginosa) (mallei) (pseudomallei) as the cause of diseases classified elsewhere: Secondary | ICD-10-CM | POA: Diagnosis present

## 2023-11-03 DIAGNOSIS — Z825 Family history of asthma and other chronic lower respiratory diseases: Secondary | ICD-10-CM

## 2023-11-03 DIAGNOSIS — Z801 Family history of malignant neoplasm of trachea, bronchus and lung: Secondary | ICD-10-CM

## 2023-11-03 DIAGNOSIS — E1122 Type 2 diabetes mellitus with diabetic chronic kidney disease: Secondary | ICD-10-CM | POA: Diagnosis present

## 2023-11-03 DIAGNOSIS — I130 Hypertensive heart and chronic kidney disease with heart failure and stage 1 through stage 4 chronic kidney disease, or unspecified chronic kidney disease: Secondary | ICD-10-CM | POA: Diagnosis present

## 2023-11-03 DIAGNOSIS — J4520 Mild intermittent asthma, uncomplicated: Secondary | ICD-10-CM | POA: Diagnosis present

## 2023-11-03 DIAGNOSIS — Z833 Family history of diabetes mellitus: Secondary | ICD-10-CM

## 2023-11-03 DIAGNOSIS — R069 Unspecified abnormalities of breathing: Secondary | ICD-10-CM | POA: Diagnosis not present

## 2023-11-03 DIAGNOSIS — R519 Headache, unspecified: Secondary | ICD-10-CM | POA: Diagnosis not present

## 2023-11-03 DIAGNOSIS — M25462 Effusion, left knee: Secondary | ICD-10-CM | POA: Diagnosis not present

## 2023-11-03 DIAGNOSIS — Z800 Family history of malignant neoplasm of digestive organs: Secondary | ICD-10-CM

## 2023-11-03 DIAGNOSIS — S3991XA Unspecified injury of abdomen, initial encounter: Secondary | ICD-10-CM | POA: Diagnosis not present

## 2023-11-03 DIAGNOSIS — Z923 Personal history of irradiation: Secondary | ICD-10-CM | POA: Diagnosis not present

## 2023-11-03 DIAGNOSIS — M545 Low back pain, unspecified: Secondary | ICD-10-CM | POA: Diagnosis not present

## 2023-11-03 LAB — COMPREHENSIVE METABOLIC PANEL WITH GFR
ALT: 17 U/L (ref 0–44)
AST: 22 U/L (ref 15–41)
Albumin: 3.2 g/dL — ABNORMAL LOW (ref 3.5–5.0)
Alkaline Phosphatase: 56 U/L (ref 38–126)
Anion gap: 11 (ref 5–15)
BUN: 31 mg/dL — ABNORMAL HIGH (ref 8–23)
CO2: 23 mmol/L (ref 22–32)
Calcium: 8.8 mg/dL — ABNORMAL LOW (ref 8.9–10.3)
Chloride: 102 mmol/L (ref 98–111)
Creatinine, Ser: 1.46 mg/dL — ABNORMAL HIGH (ref 0.44–1.00)
GFR, Estimated: 36 mL/min — ABNORMAL LOW (ref >60)
Glucose, Bld: 152 mg/dL — ABNORMAL HIGH (ref 70–99)
Potassium: 4.7 mmol/L (ref 3.5–5.1)
Sodium: 136 mmol/L (ref 135–145)
Total Bilirubin: 0.9 mg/dL (ref 0.0–1.2)
Total Protein: 6.8 g/dL (ref 6.5–8.1)

## 2023-11-03 LAB — RESPIRATORY PANEL BY PCR

## 2023-11-03 LAB — URINALYSIS, W/ REFLEX TO CULTURE (INFECTION SUSPECTED)
Bacteria, UA: NONE SEEN
Bilirubin Urine: NEGATIVE
Glucose, UA: NEGATIVE mg/dL
Hgb urine dipstick: NEGATIVE
Ketones, ur: NEGATIVE mg/dL
Nitrite: NEGATIVE
Protein, ur: 30 mg/dL — AB
Specific Gravity, Urine: 1.015 (ref 1.005–1.030)
WBC, UA: >50 WBC/hpf (ref 0–5)
pH: 5 (ref 5.0–8.0)

## 2023-11-03 LAB — CBC WITH DIFFERENTIAL/PLATELET
Abs Immature Granulocytes: 0.05 K/uL (ref 0.00–0.07)
Basophils Absolute: 0.1 K/uL (ref 0.0–0.1)
Basophils Relative: 1 %
Eosinophils Absolute: 0 K/uL (ref 0.0–0.5)
Eosinophils Relative: 0 %
HCT: 41.9 % (ref 36.0–46.0)
Hemoglobin: 13.2 g/dL (ref 12.0–15.0)
Immature Granulocytes: 1 %
Lymphocytes Relative: 21 %
Lymphs Abs: 1.5 K/uL (ref 0.7–4.0)
MCH: 29.8 pg (ref 26.0–34.0)
MCHC: 31.5 g/dL (ref 30.0–36.0)
MCV: 94.6 fL (ref 80.0–100.0)
Monocytes Absolute: 1.3 K/uL — ABNORMAL HIGH (ref 0.1–1.0)
Monocytes Relative: 19 %
Neutro Abs: 4.2 K/uL (ref 1.7–7.7)
Neutrophils Relative %: 58 %
Platelets: 163 K/uL (ref 150–400)
RBC: 4.43 MIL/uL (ref 3.87–5.11)
RDW: 13.3 % (ref 11.5–15.5)
WBC: 7.2 K/uL (ref 4.0–10.5)
nRBC: 0 % (ref 0.0–0.2)

## 2023-11-03 LAB — PROTIME-INR
INR: 1.1 (ref 0.8–1.2)
Prothrombin Time: 15 s (ref 11.4–15.2)

## 2023-11-03 LAB — GLUCOSE, CAPILLARY
Glucose-Capillary: 91 mg/dL (ref 70–99)
Glucose-Capillary: 122 mg/dL — ABNORMAL HIGH (ref 70–99)

## 2023-11-03 LAB — TSH: TSH: 7.788 u[IU]/mL — ABNORMAL HIGH (ref 0.350–4.500)

## 2023-11-03 LAB — LACTIC ACID, PLASMA
Lactic Acid, Venous: 2.3 mmol/L (ref 0.5–1.9)
Lactic Acid, Venous: 1.3 mmol/L (ref 0.5–1.9)

## 2023-11-03 LAB — T4, FREE: Free T4: 0.85 ng/dL (ref 0.61–1.12)

## 2023-11-03 MED ORDER — APIXABAN 2.5 MG PO TABS
2.5000 mg | ORAL_TABLET | Freq: Two times a day (BID) | ORAL | Status: DC
  Administered 2023-11-03 – 2023-11-05 (×5): 2.5 mg via ORAL
  Filled 2023-11-03 (×6): qty 1

## 2023-11-03 MED ORDER — SODIUM CHLORIDE 0.9 % IV BOLUS
1000.0000 mL | Freq: Once | INTRAVENOUS | Status: AC
Start: 2023-11-03 — End: 2023-11-03
  Administered 2023-11-03: 1000 mL via INTRAVENOUS

## 2023-11-03 MED ORDER — LORATADINE 10 MG PO TABS
10.0000 mg | ORAL_TABLET | Freq: Every day | ORAL | Status: DC
  Administered 2023-11-03 – 2023-11-05 (×3): 10 mg via ORAL
  Filled 2023-11-03 (×3): qty 1

## 2023-11-03 MED ORDER — ANASTROZOLE 1 MG PO TABS
1.0000 mg | ORAL_TABLET | Freq: Every day | ORAL | Status: DC
  Administered 2023-11-03 – 2023-11-05 (×3): 1 mg via ORAL
  Filled 2023-11-03 (×3): qty 1

## 2023-11-03 MED ORDER — ONDANSETRON HCL 4 MG PO TABS: 4.0000 mg | ORAL_TABLET | Freq: Three times a day (TID) | ORAL | Status: DC | PRN

## 2023-11-03 MED ORDER — BUDESON-GLYCOPYRROL-FORMOTEROL 160-9-4.8 MCG/ACT IN AERO
2.0000 | INHALATION_SPRAY | Freq: Two times a day (BID) | RESPIRATORY_TRACT | Status: DC
  Administered 2023-11-03 – 2023-11-05 (×5): 2 via RESPIRATORY_TRACT
  Filled 2023-11-03: qty 5.9

## 2023-11-03 MED ORDER — ALBUTEROL SULFATE (2.5 MG/3ML) 0.083% IN NEBU: 2.5000 mg | INHALATION_SOLUTION | Freq: Four times a day (QID) | RESPIRATORY_TRACT | Status: DC | PRN

## 2023-11-03 MED ORDER — MIRABEGRON ER 50 MG PO TB24
50.0000 mg | ORAL_TABLET | Freq: Every day | ORAL | Status: DC
  Administered 2023-11-03 – 2023-11-05 (×3): 50 mg via ORAL
  Filled 2023-11-03 (×3): qty 1

## 2023-11-03 MED ORDER — ALBUTEROL SULFATE HFA 108 (90 BASE) MCG/ACT IN AERS: 2.0000 | INHALATION_SPRAY | Freq: Four times a day (QID) | RESPIRATORY_TRACT | Status: DC | PRN

## 2023-11-03 MED ORDER — ACETAMINOPHEN 500 MG PO TABS
500.0000 mg | ORAL_TABLET | Freq: Four times a day (QID) | ORAL | Status: DC | PRN
  Administered 2023-11-03 – 2023-11-04 (×3): 500 mg via ORAL
  Filled 2023-11-03 (×3): qty 1

## 2023-11-03 MED ORDER — DULOXETINE HCL 30 MG PO CPEP
120.0000 mg | ORAL_CAPSULE | Freq: Every day | ORAL | Status: DC
Start: 2023-11-03 — End: 2023-11-05
  Administered 2023-11-03 – 2023-11-05 (×3): 120 mg via ORAL
  Filled 2023-11-03 (×3): qty 4

## 2023-11-03 MED ORDER — GABAPENTIN 400 MG PO CAPS
800.0000 mg | ORAL_CAPSULE | Freq: Three times a day (TID) | ORAL | Status: DC
Start: 2023-11-03 — End: 2023-11-05
  Administered 2023-11-03 – 2023-11-05 (×7): 800 mg via ORAL
  Filled 2023-11-03 (×7): qty 2

## 2023-11-03 MED ORDER — AZITHROMYCIN 500 MG PO TABS: 500.0000 mg | ORAL_TABLET | Freq: Every day | ORAL | Status: DC

## 2023-11-03 MED ORDER — ACETAMINOPHEN 325 MG PO TABS
650.0000 mg | ORAL_TABLET | Freq: Once | ORAL | Status: AC
  Administered 2023-11-03: 650 mg via ORAL
  Filled 2023-11-03: qty 2

## 2023-11-03 MED ORDER — PANTOPRAZOLE SODIUM 40 MG PO TBEC
40.0000 mg | DELAYED_RELEASE_TABLET | Freq: Every day | ORAL | Status: DC
  Administered 2023-11-03 – 2023-11-05 (×3): 40 mg via ORAL
  Filled 2023-11-03 (×3): qty 1

## 2023-11-03 MED ORDER — ROSUVASTATIN CALCIUM 20 MG PO TABS
40.0000 mg | ORAL_TABLET | Freq: Every day | ORAL | Status: DC
  Administered 2023-11-03 – 2023-11-05 (×3): 40 mg via ORAL
  Filled 2023-11-03 (×4): qty 2

## 2023-11-03 MED ORDER — METOPROLOL TARTRATE 25 MG PO TABS
25.0000 mg | ORAL_TABLET | Freq: Two times a day (BID) | ORAL | Status: DC
  Administered 2023-11-03 – 2023-11-05 (×4): 25 mg via ORAL
  Filled 2023-11-03 (×5): qty 1

## 2023-11-03 MED ORDER — INSULIN ASPART 100 UNIT/ML IJ SOLN
0.0000 [IU] | Freq: Three times a day (TID) | INTRAMUSCULAR | Status: DC
  Administered 2023-11-04: 1 [IU] via SUBCUTANEOUS
  Administered 2023-11-04: 2 [IU] via SUBCUTANEOUS
  Administered 2023-11-05: 1 [IU] via SUBCUTANEOUS
  Filled 2023-11-03 (×3): qty 1

## 2023-11-03 MED ORDER — MONTELUKAST SODIUM 10 MG PO TABS
10.0000 mg | ORAL_TABLET | Freq: Every day | ORAL | Status: DC
  Administered 2023-11-03 – 2023-11-04 (×2): 10 mg via ORAL
  Filled 2023-11-03 (×2): qty 1

## 2023-11-03 MED ORDER — VANCOMYCIN HCL 750 MG/150ML IV SOLN
750.0000 mg | Freq: Once | INTRAVENOUS | Status: AC
  Administered 2023-11-03: 750 mg via INTRAVENOUS
  Filled 2023-11-03 (×2): qty 150

## 2023-11-03 MED ORDER — FLUTICASONE PROPIONATE 50 MCG/ACT NA SUSP
1.0000 | Freq: Every day | NASAL | Status: DC
  Administered 2023-11-04 – 2023-11-05 (×2): 1 via NASAL
  Filled 2023-11-03 (×2): qty 16

## 2023-11-03 MED ORDER — SODIUM CHLORIDE 0.9 % IV SOLN
1.0000 g | INTRAVENOUS | Status: DC
  Administered 2023-11-03: 1 g via INTRAVENOUS
  Filled 2023-11-03 (×2): qty 10

## 2023-11-03 MED ORDER — SODIUM CHLORIDE 0.9 % IV BOLUS
1000.0000 mL | Freq: Once | INTRAVENOUS | Status: AC
  Administered 2023-11-03: 1000 mL via INTRAVENOUS

## 2023-11-03 MED ORDER — SODIUM CHLORIDE 0.9 % IV SOLN
2.0000 g | Freq: Once | INTRAVENOUS | Status: AC
  Administered 2023-11-03: 2 g via INTRAVENOUS
  Filled 2023-11-03: qty 12.5

## 2023-11-03 MED ORDER — LEVOTHYROXINE SODIUM 50 MCG PO TABS
125.0000 ug | ORAL_TABLET | Freq: Every day | ORAL | Status: DC
  Administered 2023-11-03 – 2023-11-05 (×3): 125 ug via ORAL
  Filled 2023-11-03 (×3): qty 1

## 2023-11-03 MED ORDER — VANCOMYCIN HCL IN DEXTROSE 1-5 GM/200ML-% IV SOLN
1000.0000 mg | Freq: Once | INTRAVENOUS | Status: AC
  Administered 2023-11-03: 1000 mg via INTRAVENOUS

## 2023-11-03 NOTE — ED Provider Notes (Signed)
 CT without traumatic features.  UA with signs of infection and sent for culture.  Antibiotics provided.  Updated patient.  Consult with medicine for admission.   Claudene Rover, MD 11/03/23 [phone]

## 2023-11-03 NOTE — ED Triage Notes (Signed)
 Pt arrives via [facility] from home, per ems report the pt fell x 2 tonight. On the first EMS response for the 1st fall, pt refused , no injuries. 1.5 hours later, fell again, she hit her head (tripped over dog). Left eye brow abrasion, bleeding controlled. She is on eliquis . No LOC. Both falls were witnessed. She says she feels confused  and feels weak all over. She is a 2 person assist. She does report she has had pain, foul urine. No signs of sepsis. En route, vitals, cbg 132, 112/67, hr 68, 100%. Pt is A/O x 4

## 2023-11-03 NOTE — ED Notes (Signed)
 Lab called to obtain blood due to multiple failed attempts at venipuncture by this RN and [place], RN.

## 2023-11-03 NOTE — ED Notes (Signed)
 This RN unable to obtain labs via IV or straight stick. [place], primary RN notified.

## 2023-11-03 NOTE — ED Triage Notes (Signed)
 Pt reporting that she was sitting in a chair, her dog jumped on her and the blankets fell, she reached down to get them and she fell out of the chair striking her head, no LOC. Redness to the left eye brow. She says she has been having neck pain for 2 weeks. She also has been having urinary symptoms, denies fevers. She says she just does not feel good.

## 2023-11-03 NOTE — Evaluation (Signed)
 Physical Therapy Evaluation Patient Details Name: Tammy [name] MRN: [id number] DOB: 1946/01/10 Today's Date: 11/03/2023  History of Present Illness  Tammy [name] is a 78 y.o. female with medical history significant of PAD status post left femoral artery PCI on Eliquis  and statin, HTN, HLD, mild intermittent asthma/COPD, chronic HFpEF, PSVT, CKD stage IIIb, nonobstructive CAD, lumbar stenosis status post fusion, chronic back pain, presented with generalized weakness and frequent falls at home.   Clinical Impression  Pt admitted with above diagnosis. Pt currently with functional limitations due to the deficits listed below (see PT Problem List). Pt received upright in bed with family present agreeable to PT services. Reports PTA being mod-I with RW and SPC. Has had recurrent falls the last few days due to weakness where her legs just give way.   To date, pt is mod-I with bed mobility. Very minA need to scoot anterior to EOB for feet to reach floor. Pt is able to stand CGA to RW with VC's for hand placement and ambulate to window then to recliner due tor eports of knee feeling weak and wobbly. After 2 min seated rest report wanting to go back to bed. Stands CGA and SPT to bed and returns to supine at supervision. Educated pt on importance of OOB mobility with NSG and hospital staff to reduce risk of deconditioning during stay. Encouraged to sit up in recliner later today. Pt understanding. Pt with all needs in reach. Pt will benefit from skilled PT services to address acute weakness and falls to maximize return to PLOF.        If plan is discharge home, recommend the following: A little help with walking and/or transfers;A little help with bathing/dressing/bathroom;Assistance with cooking/housework;Assist for transportation;Help with stairs or ramp for entrance   Can travel by private vehicle        Equipment Recommendations None recommended by PT  Recommendations for Other Services        Functional Status Assessment Patient has had a recent decline in their functional status and demonstrates the ability to make significant improvements in function in a reasonable and predictable amount of time.     Precautions / Restrictions Precautions Precautions: Fall Recall of Precautions/Restrictions: Intact Restrictions Weight Bearing Restrictions Per Provider Order: No      Mobility  Bed Mobility Overal bed mobility: Modified Independent               Patient Response: Cooperative  Transfers Overall transfer level: Needs assistance Equipment used: Rolling walker (2 wheels) Transfers: Sit to/from Stand Sit to Stand: Contact guard assist           General transfer comment: VC's for hand placement    Ambulation/Gait Ambulation/Gait assistance: Contact guard assist Gait Distance (Feet): 5 Feet Assistive device: Rolling walker (2 wheels) Gait Pattern/deviations: Step-to pattern       General Gait Details: EOB to window then to recliner. Reports onset of knees getting wobbly  Stairs            Wheelchair Mobility     Tilt Bed Tilt Bed Patient Response: Cooperative  Modified Rankin (Stroke Patients Only)       Balance Overall balance assessment: Needs assistance Sitting-balance support: Bilateral upper extremity supported, Feet supported Sitting balance-Leahy Scale: Good     Standing balance support: Bilateral upper extremity supported, During functional activity Standing balance-Leahy Scale: Fair Standing balance comment: light use of RW  Pertinent Vitals/Pain Pain Assessment Pain Assessment: No/denies pain    Home Living Family/patient expects to be discharged to:: Private residence Living Arrangements: Spouse/significant other;Children Available Help at Discharge: Family Type of Home: House Home Access: Ramped entrance       Home Layout: Multi-level;Able to live on main level with  bedroom/bathroom Home Equipment: Rolling Walker (2 wheels);Cane - single point;Shower seat      Prior Function Prior Level of Function : Needs assist             Mobility Comments: mod-I with RW and SPC. Drives on occasion. ADLs Comments: independent.     Extremity/Trunk Assessment   Upper Extremity Assessment Upper Extremity Assessment: Defer to OT evaluation    Lower Extremity Assessment Lower Extremity Assessment: Generalized weakness       Communication   Communication Communication: No apparent difficulties    Cognition Arousal: Alert Behavior During Therapy: WFL for tasks assessed/performed   PT - Cognitive impairments: No apparent impairments                         Following commands: Intact       Cueing Cueing Techniques: Verbal cues     General Comments      Exercises     Assessment/Plan    PT Assessment Patient needs continued PT services  PT Problem List Decreased strength;Decreased activity tolerance;Decreased balance;Decreased mobility       PT Treatment Interventions DME instruction;Balance training;Gait training;Neuromuscular re-education;Functional mobility training;Patient/family education;Therapeutic activities;Therapeutic exercise;Manual techniques    PT Goals (Current goals can be found in the Care Plan section)  Acute Rehab PT Goals Patient Stated Goal: to return home PT Goal Formulation: With patient Time For Goal Achievement: 11/17/23 Potential to Achieve Goals: Good    Frequency Min 3X/week     Co-evaluation               AM-PAC PT 6 Clicks Mobility  Outcome Measure Help needed turning from your back to your side while in a flat bed without using bedrails?: A Little Help needed moving from lying on your back to sitting on the side of a flat bed without using bedrails?: A Little Help needed moving to and from a bed to a chair (including a wheelchair)?: A Little Help needed standing up from a chair  using your arms (e.g., wheelchair or bedside chair)?: A Little Help needed to walk in hospital room?: A Little Help needed climbing 3-5 steps with a railing? : A Lot 6 Click Score: 17    End of Session Equipment Utilized During Treatment: Gait belt Activity Tolerance: Patient tolerated treatment well Patient left: in chair;with call bell/phone within reach;with chair alarm set;with family/visitor present Nurse Communication: Mobility status PT Visit Diagnosis: Other abnormalities of gait and mobility (R26.89);Muscle weakness (generalized) (M62.81);History of falling (Z91.81);Difficulty in walking, not elsewhere classified (R26.2)    Time: 1335-1401 PT Time Calculation (min) (ACUTE ONLY): 26 min   Charges:   PT Evaluation $PT Eval Low Complexity: 1 Low PT Treatments $Therapeutic Activity: 8-22 mins PT General Charges $$ ACUTE PT VISIT: 1 Visit        Dorina HERO. Fairly IV, PT, DPT Physical Therapist- [place]  [facility] 11/03/2023, 2:57 PM

## 2023-11-03 NOTE — H&P (Signed)
 History and Physical    Tammy [name] FMW:[id number] DOB: 03/28/1945 DOA: 11/03/2023  PCP: Vicci Duwaine SQUIBB, DO (Confirm with patient/family/NH records and if not entered, this has to be entered at [facility] point of entry) Patient coming from: Home  I have personally briefly reviewed patient's old medical records in [facility]  Chief Complaint: Feeling weak and fell at home  HPI: Tammy [name] is a 78 y.o. female with medical history significant of PAD status post left femoral artery PCI on Eliquis  and statin, HTN, HLD, mild intermittent asthma/COPD, chronic HFpEF, PSVT, CKD stage IIIb, nonobstructive CAD, lumbar stenosis status post fusion, chronic back pain, presented with generalized weakness and frequent falls at home.  Patient started to have generalized weakness last 3 days, more on bilateral legs, at baseline she uses roller walker to ambulate however she sustained several falls last 3 days including 1 fall last night when she lost her balance and fell and hit her forehead.  Denied any LOC, denied any prodromes of lightheadedness palpitations.  Patient also complained about burning sensation when urinate for the last 3 days and episode of chills but denied any back pain, no nausea vomiting no diarrhea.  She denied any chest pain no cough, wheezing or shortness of breath.  ED Course: Temperature 100.4 pulse 78 blood pressure 123/55 O2 saturation traumatic scan including CT head neck lumbar spine showed negative for fracture or dislocation.  CT chest abdomen showed COPD with bronchitis and subsegmental bronchial impactions in the right lower lobe.  Right lower lobe infiltrates versus atelectasis also reported.  Blood work showed WBC 7.2 hemoglobin 13.2 BUN 31 creatinine 1.4 compared to baseline 1.3-2.0 K4.7. Lactic acid 2.1  Patient was given IV bolus 2000 mL and ceftriaxone  x 1 in the ED.  Review of Systems: As per HPI otherwise 14 point review of systems negative.    Past Medical History:   Diagnosis Date   Asthma    Benign neoplasm of colon    Breast cancer (HCC) 08/2018   left breast   Cervical disc disease    bulging - no limitations per pt   Chronic diastolic CHF (congestive heart failure) (HCC)    a. 07/2012 Echo: Nl EF. mild inferoseptal HK, Gr 2 DD, mild conc LVH, mild PR/TR, mild PAH; b. 08/2019 Echo: EF 55-60%, no rwma, Gr1 DD. Nl RV size/fxn.   CKD (chronic kidney disease), stage III (HCC)    COPD (chronic obstructive pulmonary disease) (HCC)    Coronary artery disease    a. 11/2011 Cath: LAD 63m/d, LCX min irregs, RCA 70p, 143m with L->R collats, EF 60%-->Med Rx; b. 03/2019 Cath: LM nl, LAD 40-79m. LCX large, mild diff dzs. OM1/2 nl. RCA known to be 100m. L-->R collats from LAD & LCX/OM.   Diabetes mellitus without complication (HCC)    Diagnosis unknown 05/07/2022   Fusion of lumbar spine 12/22/2015   GERD (gastroesophageal reflux disease)    History of DVT (deep vein thrombosis)    History of tobacco abuse    a. Quit 2011.   Hyperlipidemia    Hypertension    Hypothyroidism    Lung cancer (HCC)    Malignant neoplasm of unspecified site of right female breast (HCC) 03/14/1999   Obesity    Palpitations    Personal history of radiation therapy    PONV (postoperative nausea and vomiting)    Pulmonary embolus (HCC) 12/25/2013   RLL. Presented with SOB and elevated d-dimer.    PVD (peripheral vascular disease) (  HCC)    a. PTA of right leg and left femoral artery with stenosis; b. 09/2020 ABI: R 0.93, L 0.88 - unchanged from prior study.   Stroke [facility]) 2015   TIAs - no deficits    Past Surgical History:  Procedure Laterality Date   ANGIOPLASTY / STENTING FEMORAL     PVD; angioplasty right leg and left femoral artery with stenosis.    APPENDECTOMY  12/2014   BACK SURGERY  03/2012   nerve stimulator inserted   BACK SURGERY     screws, rods replaced with new hardware.   BACK SURGERY  11/2016   BREAST LUMPECTOMY Left 08/26/2018   BREAST SURGERY      CARDIAC CATHETERIZATION  12/07/2011   Mid LAD 50%, distal LAD 50%, mid RCA 70%, distal RCA 100%    CARDIAC CATHETERIZATION  01/04/2011   100% occluded mid RCA with good collaterals from distal LAD, normal LVEF.   CATARACT EXTRACTION Right 12/21/2021   CHOLECYSTECTOMY     COLONOSCOPY  2013   COLONOSCOPY WITH PROPOFOL  N/A 05/24/2015   Procedure: COLONOSCOPY WITH PROPOFOL ;  Surgeon: Rogelia Copping, MD;  Location: [facility];  Service: Endoscopy;  Laterality: N/A;  Diabetic - oral meds   COLONOSCOPY WITH PROPOFOL  N/A 01/28/2019   Procedure: COLONOSCOPY WITH PROPOFOL ;  Surgeon: Janalyn Keene NOVAK, MD;  Location: [facility] ENDOSCOPY;  Service: Endoscopy;  Laterality: N/A;   DIAGNOSTIC LAPAROSCOPY     ESOPHAGOGASTRODUODENOSCOPY (EGD) WITH PROPOFOL  N/A 01/28/2019   Procedure: ESOPHAGOGASTRODUODENOSCOPY (EGD) WITH PROPOFOL ;  Surgeon: Janalyn Keene NOVAK, MD;  Location: [facility] ENDOSCOPY;  Service: Endoscopy;  Laterality: N/A;   LAPAROSCOPIC APPENDECTOMY N/A 01/06/2015   Procedure: APPENDECTOMY LAPAROSCOPIC;  Surgeon: Louanne KANDICE Muse, MD;  Location: [facility] ORS;  Service: General;  Laterality: N/A;   LEFT HEART CATH AND CORONARY ANGIOGRAPHY Left 04/04/2019   Procedure: LEFT HEART CATH AND CORONARY ANGIOGRAPHY;  Surgeon: Perla Evalene PARAS, MD;  Location: [facility] INVASIVE CV LAB;  Service: Cardiovascular;  Laterality: Left;   LOWER EXTREMITY INTERVENTION Right 10/23/2022   Procedure: Lower Extremity Angiography;  Surgeon: Marea Selinda RAMAN, MD;  Location: [facility] INVASIVE CV LAB;  Service: Cardiovascular;  Laterality: Right;   MASTECTOMY W/ SENTINEL NODE BIOPSY Left 08/26/2018   Procedure: PARTIAL MASTECTOMY WIDE EXCISION WITH SENTINEL LYMPH NODE BIOPSY LEFT;  Surgeon: Dessa Reyes ORN, MD;  Location: [facility] ORS;  Service: General;  Laterality: Left;   POLYPECTOMY  05/24/2015   Procedure: POLYPECTOMY;  Surgeon: Rogelia Copping, MD;  Location: [facility];  Service: Endoscopy;;   REPLACEMENT TOTAL KNEE Right     RIGHT/LEFT HEART CATH AND CORONARY ANGIOGRAPHY Bilateral 09/26/2021   Procedure: RIGHT/LEFT HEART CATH AND CORONARY ANGIOGRAPHY;  Surgeon: Darron Deatrice LABOR, MD;  Location: [facility] INVASIVE CV LAB;  Service: Cardiovascular;  Laterality: Bilateral;   THYROIDECTOMY     TOTAL KNEE ARTHROPLASTY Right 11/13/2019   TRIGGER FINGER RELEASE       reports that she quit smoking about 16 years ago. Her smoking use included cigarettes. She started smoking about 31 years ago. She has a 7.5 pack-year smoking history. She has never used smokeless tobacco. She reports that she does not drink alcohol  and does not use drugs.  Allergies  Allergen Reactions   Hydrocodone  Other (See Comments)    Unresponsive   Aleve [Naproxen Sodium]     Hives & itching    Contrast Media [Iodinated Contrast Media]     Pt avoids due to kidney issues   Ioxaglate Other (See Comments)    (iodine )  Pt avoids due to kidney issues    Oxycodone  Other (See Comments)    hallucinations   Ranexa [Ranolazine]     Sick on stomach   Sulfa Antibiotics Other (See Comments)    Pt avoids due to kidney issues   Tramadol  Other (See Comments)    nightmares Other reaction(s): Hallucination, Other (See Comments) nightmares     Family History  Problem Relation Age of Onset   Cancer Mother        colon   Hypertension Father    Heart disease Father    Heart attack Father    Diabetes Father    Cancer Sister        lung   COPD Sister    COPD Sister    Heart attack Brother    Diabetes Brother    Hypertension Brother    Heart disease Brother    Heart attack Brother    Diabetes Brother    Hypertension Brother    Heart disease Brother    Heart attack Brother    Diabetes Brother    Hypertension Brother    Heart disease Brother    Colon cancer Brother 16   Lung cancer Brother    Breast cancer Neg Hx      Prior to Admission medications   Medication Sig Start Date End Date Taking? Authorizing Provider  acetaminophen  (TYLENOL )  500 MG tablet Take 500 mg by mouth every 6 (six) hours as needed for moderate pain. Takes up to 3 tablets/day PRN for increased pain    [provider]  albuterol  (VENTOLIN  HFA) 108 (90 Base) MCG/ACT inhaler Inhale 2 puffs into the lungs every 6 (six) hours as needed for wheezing or shortness of breath. 09/04/22   Johnson, Megan P, DO  anastrozole  (ARIMIDEX ) 1 MG tablet TAKE 1 TABLET BY MOUTH ONCE DAILY 08/22/23   Brahmanday, Govinda R, MD  apixaban  (ELIQUIS ) 2.5 MG TABS tablet Take 1 tablet (2.5 mg total) by mouth 2 (two) times daily. 04/15/23   Johnson, Megan P, DO  Calcium  Carb-Cholecalciferol  (CALCIUM  600 + D) 600-200 MG-UNIT TABS Take 1 tablet by mouth daily.     [provider]  cephALEXin  (KEFLEX ) 500 MG capsule Take 500 mg by mouth daily. 07/27/22   [provider]  cetirizine  (ZYRTEC ) 10 MG tablet TAKE 1 TABLET BY MOUTH DAILY 04/24/23   Johnson, Megan P, DO  cholecalciferol  (VITAMIN D3) 25 MCG (1000 UT) tablet Take 1,000 Units by mouth daily.    [provider]  Continuous Glucose Receiver (FREESTYLE LIBRE 3 READER) DEVI by Does not apply route.    [provider]  Continuous Glucose Sensor (FREESTYLE LIBRE 3 PLUS SENSOR) MISC 1 each by Does not apply route. Change sensor every 15 days.    [provider]  CRANBERRY PO Take 2 capsules by mouth daily.    [provider]  DULoxetine  (CYMBALTA ) 60 MG capsule TAKE 2 CAPSULES BY MOUTH ONCE DAILY 04/10/23   Vicci, Megan P, DO  fluticasone  (FLONASE ) 50 MCG/ACT nasal spray Place 1 spray into both nostrils daily. 07/12/23   Vicci Bouchard P, DO  Fluticasone -Umeclidin-Vilant (TRELEGY ELLIPTA ) 100-62.5-25 MCG/ACT AEPB INHALE 1 PUFF INTO THE LUNGS DAILY 08/03/23   Vicci Bouchard P, DO  gabapentin  (NEURONTIN ) 400 MG capsule Take 2 capsules (800 mg total) by mouth 3 (three) times daily. 08/27/23 11/25/23  Vicci Bouchard P, DO  levothyroxine  (SYNTHROID ) 125 MCG tablet Take 1 tablet (125 mcg total) by  mouth daily before breakfast. 08/29/23  Johnson, Megan P, DO  metoprolol  tartrate (LOPRESSOR ) 25 MG tablet Take 1 tablet (25 mg total) by mouth 2 (two) times daily. 09/12/23 12/11/23  Loistine Sober, NP  montelukast  (SINGULAIR ) 10 MG tablet TAKE 1 TABLET BY MOUTH AT BEDTIME 09/10/23   Johnson, Megan P, DO  MYRBETRIQ  50 MG TB24 tablet TAKE 1 TABLET BY MOUTH DAILY 01/31/21   Vaillancourt, Samantha, PA-C  nitroGLYCERIN  (NITROSTAT ) 0.4 MG SL tablet Place 1 tablet (0.4 mg total) under the tongue every 5 (five) minutes as needed for chest pain. Total of 3 doses 09/20/21   Gollan, Timothy J, MD  ondansetron  (ZOFRAN ) 4 MG tablet Take 1 tablet (4 mg total) by mouth every 8 (eight) hours as needed for nausea or vomiting. 12/05/22   Vicci, Megan P, DO  pantoprazole  (PROTONIX ) 40 MG tablet TAKE 1 TABLET BY MOUTH ONCE DAILY 10/12/23   Johnson, Megan P, DO  Pumpkin Seed-Soy Germ (AZO BLADDER CONTROL/GO-LESS PO) Take 1 tablet by mouth in the morning and at bedtime.    [provider]  rosuvastatin  (CRESTOR ) 40 MG tablet Take 1 tablet (40 mg total) by mouth daily. 04/15/23   Vicci Bouchard P, DO  tirzepatide  (MOUNJARO ) 7.5 MG/0.5ML Pen Inject 7.5 mg into the skin once a week. 10/23/23   Johnson, Megan P, DO  ZINC  CITRATE PO Take 1 tablet by mouth daily.    [provider]    Physical Exam: Vitals:   11/03/23 0442 11/03/23 0535  BP: (!) 123/55 (!) 118/59  Pulse: 71 70  Resp: 14 16  Temp: (!) 100.4 F (38 C)   TempSrc: Oral   SpO2: 97% 98%    Constitutional: NAD, calm, comfortable Vitals:   11/03/23 0442 11/03/23 0535  BP: (!) 123/55 (!) 118/59  Pulse: 71 70  Resp: 14 16  Temp: (!) 100.4 F (38 C)   TempSrc: Oral   SpO2: 97% 98%   Eyes: PERRL, lids and conjunctivae normal ENMT: Mucous membranes are moist. Posterior pharynx clear of any exudate or lesions.Normal dentition.  Neck: normal, supple, no masses, no thyromegaly Respiratory: clear to auscultation bilaterally, no wheezing,  no crackles. Normal respiratory effort. No accessory muscle use.  Cardiovascular: Regular rate and rhythm, no murmurs / rubs / gallops. No extremity edema. 2+ pedal pulses. No carotid bruits.  Abdomen: no tenderness, no masses palpated. No hepatosplenomegaly. Bowel sounds positive.  Musculoskeletal: no clubbing / cyanosis. No joint deformity upper and lower extremities. Good ROM, no contractures. Normal muscle tone.  Skin: no rashes, lesions, ulcers. No induration Neurologic: CN 2-12 grossly intact. Sensation intact, DTR normal. Strength 4/5 in bilateral lower extremities Psychiatric: Normal judgment and insight. Alert and oriented x 3. Normal mood.     Labs on Admission: I have personally reviewed following labs and imaging studies  CBC: Recent Labs  Lab 11/03/23 0627  WBC 7.2  NEUTROABS 4.2  HGB 13.2  HCT 41.9  MCV 94.6  PLT 163   Basic Metabolic Panel: Recent Labs  Lab 11/03/23 0627  NA 136  K 4.7  CL 102  CO2 23  GLUCOSE 152*  BUN 31*  CREATININE 1.46*  CALCIUM  8.8*   GFR: CrCl cannot be calculated (Unknown ideal weight.). Liver Function Tests: Recent Labs  Lab 11/03/23 0627  AST 22  ALT 17  ALKPHOS 56  BILITOT 0.9  PROT 6.8  ALBUMIN 3.2*   No results for input(s): LIPASE, AMYLASE in the last 168 hours. No results for input(s): AMMONIA in the last 168 hours. Coagulation Profile: Recent  Labs  Lab 11/03/23 0627  INR 1.1   Cardiac Enzymes: No results for input(s): CKTOTAL, CKMB, CKMBINDEX, TROPONINI in the last 168 hours. BNP (last 3 results) No results for input(s): PROBNP in the last 8760 hours. HbA1C: No results for input(s): HGBA1C in the last 72 hours. CBG: No results for input(s): GLUCAP in the last 168 hours. Lipid Profile: No results for input(s): CHOL, HDL, LDLCALC, TRIG, CHOLHDL, LDLDIRECT in the last 72 hours. Thyroid  Function Tests: Recent Labs    11/03/23 0627  TSH 7.788*  FREET4 0.85   Anemia  Panel: No results for input(s): VITAMINB12, FOLATE, FERRITIN, TIBC, IRON, RETICCTPCT in the last 72 hours. Urine analysis:    Component Value Date/Time   COLORURINE YELLOW (A) 11/03/2023 0740   APPEARANCEUR CLOUDY (A) 11/03/2023 0740   APPEARANCEUR Clear 08/10/2023 1413   LABSPEC 1.015 11/03/2023 0740   PHURINE 5.0 11/03/2023 0740   GLUCOSEU NEGATIVE 11/03/2023 0740   HGBUR NEGATIVE 11/03/2023 0740   BILIRUBINUR NEGATIVE 11/03/2023 0740   BILIRUBINUR Negative 05/29/2023 1000   KETONESUR NEGATIVE 11/03/2023 0740   PROTEINUR 30 (A) 11/03/2023 0740   UROBILINOGEN 0.2 06/24/2015 0919   NITRITE NEGATIVE 11/03/2023 0740   LEUKOCYTESUR LARGE (A) 11/03/2023 0740    Radiological Exams on Admission: CT CHEST ABDOMEN PELVIS WO CONTRAST Result Date: 11/03/2023 CLINICAL DATA:  Blunt polytrauma, questionable sepsis. Fall injury, not feeling well. EXAM: CT CHEST, ABDOMEN AND PELVIS WITHOUT CONTRAST TECHNIQUE: Multidetector CT imaging of the chest, abdomen and pelvis was performed following the standard protocol without IV contrast. RADIATION DOSE REDUCTION: This exam was performed according to the departmental dose-optimization program which includes automated exposure control, adjustment of the mA and/or kV according to patient size and/or use of iterative reconstruction technique. COMPARISON:  Portable chest from today, chest radiograph 03/10/2022, chest CTs without contrast recently 10/16/2023, last year 07/12/2022, and CT abdomen and pelvis without contrast from 04/11/2022. FINDINGS: CT CHEST FINDINGS Cardiovascular: There is mild cardiomegaly. Three-vessel coronary calcification heaviest in the LAD. Diffuse aortic atherosclerosis and moderate patchy calcification in the great vessels. The aorta is normal caliber until the distal third of the thoracic aorta to the hiatal segment, which is mildly dilated measuring up to 4 cm. This was seen previously.  The descending segment is tortuous.  Pulmonary arteries and veins are normal caliber. No pericardial effusion. Mediastinum/Nodes: No enlarged mediastinal, hilar, or axillary lymph nodes. Thyroid  gland, trachea, and esophagus demonstrate no acute significant findings. The esophagus is patulous but no more than previously. Lungs/Pleura: The lungs are moderately emphysematous with centrilobular changes predominating, paraseptal changes in the upper apices. There is scarring in the lung apices, linear scarring left upper lobe. Atelectasis or scarring posteriorly right middle lobe. Diffuse bronchial thickening. Occasional subsegmental bronchial impactions noted right lower lobe. There is increased opacity in the posterior right lower lobe which could be atelectasis or pneumonia. Not seen previously. Few tiny lung nodules described previously are unchanged. No suspicious or new nodule. No pleural effusion. Rest of the lungs are clear.  There is no pneumothorax. 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 No acute regional skeletal fracture is suspected. Mild degenerative change thoracic spine. Spinal stimulator wiring enters the dorsal spinal canal at T10-11 terminating at T8-9. CT ABDOMEN PELVIS FINDINGS Hepatobiliary: No focal liver abnormality is seen. Status post cholecystectomy. No biliary dilatation. Pancreas: Partially atrophic. Otherwise unremarkable without contrast. Spleen: Unremarkable without contrast.  No splenomegaly. Adrenals/Urinary Tract: There is no adrenal mass or hemorrhage. No perinephric hemorrhage is  seen or acute renal injury without contrast. Chronic asymmetric atrophy and scarring right kidney with chronic hydronephrosis. No ureteral dilatation or stones are seen bilaterally. Bladder is unremarkable. Stomach/Bowel: Chronic 3.7 cm periampullary duodenal diverticulum with fluid level. Normal caliber unopacified small bowel surgically absent appendix. Mobile cecum located anterior midline upper  abdomen. No wall thickening or dilatation of the colon. There is advanced diverticulosis in the descending and sigmoid segments. No evidence of acute diverticulitis. Vascular/Lymphatic: Aortic atherosclerosis. No enlarged abdominal or pelvic lymph nodes. Reproductive: Uterus and bilateral adnexa are unremarkable. Other: No abdominal wall hematoma or fluid collection. No incarcerated hernias. There is no mesenteric contusion. There is no free fluid, free hemorrhage or free air. Musculoskeletal: L2-S1 dorsal fusion hardware again is noted. No regional skeletal fracture is seen. There is osteopenia. Neurostimulator battery left flank. IMPRESSION: 1. No acute trauma related findings in the chest, abdomen or pelvis. 2. COPD with bronchitis and occasional subsegmental bronchial impactions in the right lower lobe. 3. Increased opacity in the posterior right lower lobe which could be atelectasis or pneumonia. 4. Aortic and coronary artery atherosclerosis.  Mild cardiomegaly. 5. 4 cm distal thoracic aortic aneurysm, unchanged. 6. Chronic asymmetric atrophy and scarring right kidney with chronic hydronephrosis. 7. Diverticulosis without evidence of diverticulitis. 8. Osteopenia, postsurgical and degenerative change. 9. 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 Aortic Atherosclerosis (ICD10-I70.0) and Emphysema (ICD10-J43.9). Electronically Signed   By: Francis Quam M.D.   On: 11/03/2023 07:54   CT L-SPINE NO CHARGE Result Date: 11/03/2023 CLINICAL DATA:  78 year old female status post fall from chair striking head. Pain. Malaise. EXAM: CT LUMBAR SPINE WITHOUT CONTRAST TECHNIQUE: Technique: Multiplanar CT images of the lumbar spine were reconstructed from contemporary CT of the Abdomen and Pelvis. RADIATION DOSE REDUCTION: This exam was performed according to the departmental dose-optimization program which includes automated exposure control, adjustment of the mA and/or kV according to patient size  and/or use of iterative reconstruction technique. CONTRAST:  None COMPARISON:  CT Thoracic spine, Chest, Abdomen, and Pelvis today reported separately. Previous lumbar spine CT 02/14/2019. Lumbar MRI 04/17/2017. FINDINGS: Segmentation: Normal, the same numbering system used on the 2020 CT. Alignment: Stable chronic straightening of lumbar lordosis. Minimal levoconvex lumbar curvature. Vertebrae: Osteopenia. Chronic spinal hardware L2 through S1. Postoperative details are below. Stable lumbar vertebral height since 2020. Visible sacrum and SI joints appear intact. Paraspinal and other soft tissues: Abdomen and pelvis reported separately today. Chronic left flank thoracic spinal stimulator device. Chronic postoperative changes to the lumbar paraspinal soft tissues. Disc levels: L1-L2: Mild disc bulging. Moderate facet hypertrophy. No significant stenosis. L2-L3: Chronic posterior and interbody fusion. No hardware loosening. Evidence of developing arthrodesis. L3-L4: Chronic posterior and interbody fusion. No hardware loosening. Although medial course of the right L3 pedicle screw (series 5, image 77), stable since 2020. Chronic L3 endplate subsidence. Some evidence of arthrodesis, most pronounced at the right posterior elements. L4-L5: Chronic posterior and interbody fusion. No evidence of hardware loosening. Chronic mild endplate subsidence. Evidence of some posterior element arthrodesis primarily on the right. Residual foraminal disc osteophyte complex greater on the right. This level appears stable. L5-S1: Chronic decompression and fusion. No evidence of hardware loosening. Evidence of solid arthrodesis. Stable residual foraminal endplate spurring. IMPRESSION: 1. No acute traumatic injury identified in the Lumbar Spine. 2. Chronic postoperative sequelae L2 through S1 posterior and interbody fusion appears stable from a 2020 CT. No definite pseudoarthrosis. 3. Adjacent segment disease at L1-L2 without significant  spinal stenosis by  CT. 4. CT Abdomen and Pelvis today reported separately. Electronically Signed   By: VEAR Hurst M.D.   On: 11/03/2023 07:31   CT T-SPINE NO CHARGE Result Date: 11/03/2023 CLINICAL DATA:  78 year old female status post fall from chair striking head. Pain. Malaise. EXAM: CT THORACIC SPINE WITHOUT CONTRAST TECHNIQUE: Multiplanar CT images of the thoracic spine were reconstructed from contemporary CT of the Chest. RADIATION DOSE REDUCTION: This exam was performed according to the departmental dose-optimization program which includes automated exposure control, adjustment of the mA and/or kV according to patient size and/or use of iterative reconstruction technique. CONTRAST:  None COMPARISON:  CT Chest, Abdomen, and Pelvis today are reported separately. Cervical spine CT today. Thoracic spine MRI 11/15/2021. FINDINGS: Limited cervical spine imaging:  Reported separately today. Thoracic spine segmentation: Appears normal, same numbering system used on the previous MRI. Alignment: Stable since 2023 and relatively normal thoracic kyphosis. There is mild upper thoracic dextroconvex scoliosis. No significant spondylolisthesis. Vertebrae: Maintained thoracic vertebral body height. No thoracic vertebral fracture identified. Visible posterior ribs also appear intact. Paraspinal and other soft tissues: Chest and abdomen reported separately today. Chronic thoracic spinal stimulator device entering the posterior spinal canal at T10-T11, present on previous MRI. Electrodes course superficially toward the left flank. Otherwise negative thoracic paraspinal soft tissues. Disc levels: Thoracic spine degeneration appears mild for age and stable from the 2023 MRI. IMPRESSION: 1. No acute traumatic injury identified in the Thoracic Spine. 2. Chronic thoracic spinal stimulator device. And mild for age thoracic spine degeneration which appears stable from a 2023 MRI. 3.  CT Chest, Abdomen, and Pelvis today are reported  separately. Electronically Signed   By: VEAR Hurst M.D.   On: 11/03/2023 07:05   CT Cervical Spine Wo Contrast Result Date: 11/03/2023 CLINICAL DATA:  77 year old female status post fall from chair striking head. Pain. Malaise. EXAM: CT CERVICAL SPINE WITHOUT CONTRAST TECHNIQUE: Multidetector CT imaging of the cervical spine was performed without intravenous contrast. Multiplanar CT image reconstructions were also generated. RADIATION DOSE REDUCTION: This exam was performed according to the departmental dose-optimization program which includes automated exposure control, adjustment of the mA and/or kV according to patient size and/or use of iterative reconstruction technique. COMPARISON:  MRI cervical spine 11/15/2021. FINDINGS: Alignment: Stable since 2023. Straightening of cervical lordosis plus mild degenerative appearing anterolisthesis at C3-C4, C4-C5, C5-C6. Underlying mild levoconvex cervical scoliosis. Maintained cervicothoracic and posterior element alignment. Skull base and vertebrae: Bone mineralization is within normal limits for age. Visualized skull base is intact. No atlanto-occipital dissociation. C1 and C2 appear intact and aligned. No acute osseous abnormality identified. Soft tissues and spinal canal: No prevertebral fluid or swelling. No visible canal hematoma. Calcified left carotid bifurcation atherosclerosis. Negative visible other noncontrast neck soft tissues. Disc levels: Chronic cervical spine disease including bilateral facet arthropathy in association with chronic upper cervical degenerative spondylolisthesis. Chronic disc and endplate degeneration C5-C6 and C6-C7. Spinal and foraminal stenosis at those levels, appears not significantly changed from previous MRI. Upper chest: Thoracic spine CT today reported separately. Centrilobular and paraseptal emphysema in the lung apices. IMPRESSION: 1. No acute traumatic injury identified in the cervical spine. 2. Chronic cervical spine  degeneration appears stable from the 2023 MRI. 3.  Emphysema (ICD10-J43.9). Electronically Signed   By: VEAR Hurst M.D.   On: 11/03/2023 07:00   CT HEAD WO CONTRAST ( ) Result Date: 11/03/2023 CLINICAL DATA:  78 year old female status post fall from chair striking head. Pain. Malaise. EXAM: CT HEAD WITHOUT CONTRAST TECHNIQUE: Contiguous axial  images were obtained from the base of the skull through the vertex without intravenous contrast. RADIATION DOSE REDUCTION: This exam was performed according to the departmental dose-optimization program which includes automated exposure control, adjustment of the mA and/or kV according to patient size and/or use of iterative reconstruction technique. COMPARISON:  Brain MRI 10/03/2018.  Head CT 03/08/2022. FINDINGS: Brain: Cerebral volume not significantly changed since 2023. Chronic encephalomalacia left inferior frontal gyrus, stable. No midline shift, ventriculomegaly, mass effect, evidence of mass lesion, intracranial hemorrhage or evidence of cortically based acute infarction. Patchy other scattered cerebral white matter hypodensity appears stable since 2023. Vascular: No suspicious intracranial vascular hyperdensity. Calcified atherosclerosis at the skull base. Skull: Appears stable and intact. Sinuses/Orbits: Visualized paranasal sinuses and mastoids are stable and well aerated. Other: Mild left forehead scalp contusion or hematoma on series 3, image 48. No scalp soft tissue gas. Underlying calvarium appears intact. Orbits soft tissues appears stable and negative. IMPRESSION: 1. Mild left forehead scalp soft tissue injury.  No skull fracture. 2. No acute intracranial abnormality. Stable since 2023 left inferior frontal gyrus encephalomalacia, scattered chronic white matter disease. Electronically Signed   By: VEAR Hurst M.D.   On: 11/03/2023 06:57   DG Knee 2 Views Left Result Date: 11/03/2023 CLINICAL DATA:  Fall with left knee pain. EXAM: LEFT KNEE - 1-2 VIEW  COMPARISON:  Study of 02/25/2018. FINDINGS: AP Lat two views. Small suprapatellar bursal effusion. Osteopenia noted without evidence of fracture or dislocation. The There is moderate 2 advanced narrowing and osteophytosis of the medial joint compartment, slight spurring and narrowing in the patellofemoral joint. No narrowing in the lateral compartment. No other focal bone abnormality is seen. There is calcification in the distal superficial femoral artery. Soft tissues otherwise unremarkable. Comparison to the prior study reveals no significant interval change. IMPRESSION: 1. Osteopenia and degenerative change without evidence of fractures. 2. Small suprapatellar bursal effusion. 3. Atherosclerosis. Electronically Signed   By: Francis Quam M.D.   On: 11/03/2023 06:00   DG Chest Port 1 View Result Date: 11/03/2023 CLINICAL DATA:  Fall.  Not feeling well.  Possible sepsis. EXAM: PORTABLE CHEST 1 VIEW COMPARISON:  03/10/2022 FINDINGS: The cardio pericardial silhouette is enlarged. Chronic linear atelectasis or scarring in the left mid lung is similar to prior. No dense focal airspace consolidation or evidence of pulmonary edema. No substantial pleural effusion. No acute bony abnormality. Thoracic spinal stimulator device evident. IMPRESSION: Chronic atelectasis or scarring in the left mid lung. No acute cardiopulmonary findings. Electronically Signed   By: Camellia Candle M.D.   On: 11/03/2023 05:40    EKG: Independently reviewed.  Sinus rhythm, no acute ST changes.  Assessment/Plan Principal Problem:   Fall Active Problems:   UTI (urinary tract infection)   Impaired ambulation  (please populate well all problems here in Problem List. (For example, if patient is on BP meds at home and you resume or decide to hold them, it is a problem that needs to be her. Same for CAD, COPD, HLD and so on)  Acute on chronic ambulation impairment Frequent falls Complicated UTI with hematuria - Treat UTI - Continue  Rocephin  - PT OT evaluation - Other DDx, no symptoms or signs to indicate near syncope and trauma scan negative for acute fracture or dislocation.  Elevated lactic acid - No other SIRS criteria met - Resolved after IV hydration - Doubt sepsis, monitor off maintenance IV fluid.  Antibiotics as above.  COPD - No symptoms or signs of acute exacerbation -  Continue ICS and LABA - Continue as needed albuterol   Right lower lobe atelectasis - Low suspicion for pneumonia as patient does not have cough or leukocytosis and low-grade fever likely secondary to UTI. - Incentive spirometry  History of PVD - Stable, continue Eliquis  and statin - Chronic ambulation impairment but no claudication  Breast cancer - On chronic anastrozole  therapy  Chronic back pain - On relatively high dose of gabapentin , recommend outpatient follow-up with PCP to discuss tapering down gabapentin  given her kidney function.  CKD stage IIIb - Euvolemic, creatinine level stable  Total time spent on patient care 55 minutes DVT prophylaxis: Eliquis  Code Status: Full code Family Communication: Husband at bedside Disposition Plan: Expect less than 2 midnight hospital stay Consults called: None Admission status: MedSurg observation   Cort ONEIDA Mana MD [facility] Pager [phone]  11/03/2023, 8:36 AM

## 2023-11-03 NOTE — ED Provider Notes (Signed)
 [facility] Provider Note    Event Date/Time   First MD Initiated Contact with Patient 11/03/23 0448     (approximate)   History   Fall   HPI  Tammy [name] is a 78 y.o. female   Past medical history of PE on Eliquis , CAD, COPD, chronic lower back pain, CKD, type II diabetic, degenerative disc disease, thyroid  disease who presents to the Emergency Department with a fall in the setting of 2 days of generalized weakness and fatigue and dysuria.  She states that she has been feeling rundown and tired dysuria for 2 days and today had chills and wrapped herself up in blankets, her blankets fell to the floor as she was bending over to pick them up she fell forward striking her face.  She had no loss of consciousness.  Independent Historian contributed to assessment above: Husband at bedside corroborates information above    Physical Exam   Triage Vital Signs: ED Triage Vitals  Encounter Vitals Group     BP 11/03/23 0442 (!) 123/55     Girls Systolic BP Percentile --      Girls Diastolic BP Percentile --      Boys Systolic BP Percentile --      Boys Diastolic BP Percentile --      Pulse Rate 11/03/23 0442 71     Resp 11/03/23 0442 14     Temp 11/03/23 0442 (!) 100.4 F (38 C)     Temp Source 11/03/23 0442 Oral     SpO2 11/03/23 0442 97 %     Weight --      Height --      Head Circumference --      Peak Flow --      Pain Score 11/03/23 0437 8     Pain Loc --      Pain Education --      Exclude from Growth Chart --     Most recent vital signs: Vitals:   11/03/23 0442 11/03/23 0535  BP: (!) 123/55 (!) 118/59  Pulse: 71 70  Resp: 14 16  Temp: (!) 100.4 F (38 C)   SpO2: 97% 98%    General: Awake, no distress.  CV:  Good peripheral perfusion.  Resp:  Normal effort.  Abd:  No distention. Other:  Bruising on the left forehead.  Neck supple full range of motion no deformity or midline tenderness.  Midline tenderness to the L-spine without  deformity.  No chest wall bruising, but mild tenderness to the sternum, as well as diffuse tenderness to the abdomen without rigidity or guarding.  Able to move upper extremities with full active range of motion.  Ranging at the hips bilaterally but painful on the left side knee and hip.  Fever is 100.4 Fahrenheit  ED Results / Procedures / Treatments   Labs (all labs ordered are listed, but only abnormal results are displayed) Labs Reviewed  CBC WITH DIFFERENTIAL/PLATELET - Abnormal; Notable for the following components:      Result Value   Monocytes Absolute 1.3 (*)    All other components within normal limits  LACTIC ACID, PLASMA - Abnormal; Notable for the following components:   Lactic Acid, Venous 2.3 (*)    All other components within normal limits  CULTURE, BLOOD (ROUTINE X 2)  CULTURE, BLOOD (ROUTINE X 2)  PROTIME-INR  COMPREHENSIVE METABOLIC PANEL WITH GFR  LACTIC ACID, PLASMA  URINALYSIS, W/ REFLEX TO CULTURE (INFECTION SUSPECTED)  TSH  T4,  FREE     I ordered and reviewed the above labs they are notable for lactic is increased at 2.3.  EKG  ED ECG REPORT I, Ginnie Shams, the attending physician, personally viewed and interpreted this ECG.   Date: 11/03/2023  EKG Time: 0436  Rate: 70  Rhythm: sinus  Axis: LAD  Intervals:nl  ST&T Change: no stemi    RADIOLOGY I independently reviewed and interpreted chest x-ray and I see no obvious focality pneumothorax I also reviewed radiologist's formal read.   PROCEDURES:  Critical Care performed: Yes, see critical care procedure note(s)  .Critical Care  Performed by: Shams Ginnie, MD Authorized by: Shams Ginnie, MD   Critical care provider statement:    Critical care time (minutes):  45   Critical care was time spent personally by me on the following activities:  Development of treatment plan with patient or surrogate, discussions with consultants, evaluation of patient's response to treatment, examination of patient,  ordering and review of laboratory studies, ordering and review of radiographic studies, ordering and performing treatments and interventions, pulse oximetry, re-evaluation of patient's condition and review of old charts    MEDICATIONS ORDERED IN ED: Medications  ceFEPIme  (MAXIPIME ) 2 g in sodium chloride  0.9 % 100 mL IVPB (has no administration in time range)  vancomycin  (VANCOCIN ) IVPB 1000 mg/200 mL premix (has no administration in time range)    Followed by  vancomycin  (VANCOREADY) IVPB 750 mg/150 mL (has no administration in time range)  sodium chloride  0.9 % bolus 1,000 mL (has no administration in time range)  acetaminophen  (TYLENOL ) tablet 650 mg (650 mg Oral Given 11/03/23 0542)  sodium chloride  0.9 % bolus 1,000 mL (1,000 mLs Intravenous New Bag/Given 11/03/23 0542)     IMPRESSION / MDM / ASSESSMENT AND PLAN / ED COURSE  I reviewed the triage vital signs and the nursing notes.                                Patient's presentation is most consistent with acute presentation with potential threat to life or bodily function.  Differential diagnosis includes, but is not limited to, urinary tract infection, sepsis, dehydration or electrolyte abnormality, thyroid  dysfunction, blunt traumatic injury including head injury or ICH, C-spine injury, blunt thoracic or abdominal injury   The patient is on the cardiac monitor to evaluate for evidence of arrhythmia and/or significant heart rate changes.  MDM:    In terms of her 2 days of generalized weakness and dysuria I am afraid that she is urinary tract infection, concern for sepsis given that she is febrile and has a borderline low diastolic blood pressure.  Get sepsis labs including blood cultures, lactic.  Give fluid bolus.  Give Tylenol .  She had a fall that appears mechanical in nature in the setting of her recent illness she is on blood thinners and has evidence of injury to her head we will get a CT of the head neck, as well as a  CT of the chest abdomen and pelvis given that she has some mild tenderness to palpation substernally and throughout the abdomen as well.  This will check her pelvis given her left hip pain.  Get a plain film of the left knee given pain there as well.  She had a urinalysis and urine culture but had multidrug-resistant Klebsiella earlier this year.  Given concern for sepsis will initiate with broad-spectrum antibiotics with vancomycin  and cefepime -(cefepime  susceptible urine culture  from earlier.)  Unfortunately labs were delayed as we are unable to obtain blood sampling, awaiting lab to come for blood draw.  She did get IV access and was able to infuse fluids.  -- Lactic is increased to 2.3.  I wrote for another liter of fluids to cover her for 30 cc/kg ideal body weight fluid bolus for concern for sepsis.  She has already been ordered for broad-spectrum antibiotics for suspected urinary tract infection based on prior sensitivities.  Pending the remainder of her trauma scans, to be admitted, signed out to oncoming provider in stable condition, both the patient and husband were updated on plan of care.      FINAL CLINICAL IMPRESSION(S) / ED DIAGNOSES   Final diagnoses:  Fall, initial encounter  Injury of head, initial encounter  Dysuria  Fever, unspecified fever cause  Generalized weakness  Lactic acidosis     Rx / DC Orders   ED Discharge Orders     None        Note:  This document was prepared using Dragon voice recognition software and may include unintentional dictation errors.    Cyrena Mylar, MD 11/03/23 [phone]

## 2023-11-03 NOTE — Progress Notes (Signed)
 ED Pharmacy Antibiotic Sign Off An antibiotic consult was received from an ED provider for vancomycin  per pharmacy dosing for sepsis. A chart review was completed to assess appropriateness.   The following one time order(s) were placed:  Vancomycin  1000mg  followed by 750mg    Further antibiotic and/or antibiotic pharmacy consults should be ordered by the admitting provider if indicated.   Thank you for allowing pharmacy to be a part of this patient's care.   Suzann Allean LABOR [facility]  Clinical Pharmacist 11/03/23 6:57 AM

## 2023-11-03 NOTE — Plan of Care (Signed)

## 2023-11-04 DIAGNOSIS — I2510 Atherosclerotic heart disease of native coronary artery without angina pectoris: Secondary | ICD-10-CM | POA: Diagnosis present

## 2023-11-04 DIAGNOSIS — E1122 Type 2 diabetes mellitus with diabetic chronic kidney disease: Secondary | ICD-10-CM | POA: Diagnosis present

## 2023-11-04 DIAGNOSIS — Z833 Family history of diabetes mellitus: Secondary | ICD-10-CM | POA: Diagnosis not present

## 2023-11-04 DIAGNOSIS — N1832 Chronic kidney disease, stage 3b: Secondary | ICD-10-CM | POA: Diagnosis present

## 2023-11-04 DIAGNOSIS — Z87891 Personal history of nicotine dependence: Secondary | ICD-10-CM | POA: Diagnosis not present

## 2023-11-04 DIAGNOSIS — W19XXXA Unspecified fall, initial encounter: Secondary | ICD-10-CM | POA: Diagnosis not present

## 2023-11-04 DIAGNOSIS — Z7901 Long term (current) use of anticoagulants: Secondary | ICD-10-CM | POA: Diagnosis not present

## 2023-11-04 DIAGNOSIS — Y92009 Unspecified place in unspecified non-institutional (private) residence as the place of occurrence of the external cause: Secondary | ICD-10-CM | POA: Diagnosis not present

## 2023-11-04 DIAGNOSIS — R296 Repeated falls: Secondary | ICD-10-CM | POA: Diagnosis present

## 2023-11-04 DIAGNOSIS — E872 Acidosis, unspecified: Secondary | ICD-10-CM | POA: Diagnosis present

## 2023-11-04 DIAGNOSIS — E1151 Type 2 diabetes mellitus with diabetic peripheral angiopathy without gangrene: Secondary | ICD-10-CM | POA: Diagnosis present

## 2023-11-04 DIAGNOSIS — W1830XA Fall on same level, unspecified, initial encounter: Secondary | ICD-10-CM | POA: Diagnosis present

## 2023-11-04 DIAGNOSIS — Z923 Personal history of irradiation: Secondary | ICD-10-CM | POA: Diagnosis not present

## 2023-11-04 DIAGNOSIS — J9811 Atelectasis: Secondary | ICD-10-CM | POA: Diagnosis present

## 2023-11-04 DIAGNOSIS — Z8249 Family history of ischemic heart disease and other diseases of the circulatory system: Secondary | ICD-10-CM | POA: Diagnosis not present

## 2023-11-04 DIAGNOSIS — E890 Postprocedural hypothyroidism: Secondary | ICD-10-CM | POA: Diagnosis present

## 2023-11-04 DIAGNOSIS — N390 Urinary tract infection, site not specified: Secondary | ICD-10-CM | POA: Diagnosis present

## 2023-11-04 DIAGNOSIS — Z7985 Long-term (current) use of injectable non-insulin antidiabetic drugs: Secondary | ICD-10-CM | POA: Diagnosis not present

## 2023-11-04 DIAGNOSIS — B965 Pseudomonas (aeruginosa) (mallei) (pseudomallei) as the cause of diseases classified elsewhere: Secondary | ICD-10-CM | POA: Diagnosis present

## 2023-11-04 DIAGNOSIS — E785 Hyperlipidemia, unspecified: Secondary | ICD-10-CM | POA: Diagnosis present

## 2023-11-04 DIAGNOSIS — J4489 Other specified chronic obstructive pulmonary disease: Secondary | ICD-10-CM | POA: Diagnosis present

## 2023-11-04 DIAGNOSIS — N3000 Acute cystitis without hematuria: Secondary | ICD-10-CM | POA: Diagnosis not present

## 2023-11-04 DIAGNOSIS — I5032 Chronic diastolic (congestive) heart failure: Secondary | ICD-10-CM | POA: Diagnosis present

## 2023-11-04 DIAGNOSIS — G8929 Other chronic pain: Secondary | ICD-10-CM | POA: Diagnosis present

## 2023-11-04 DIAGNOSIS — Z79890 Hormone replacement therapy (postmenopausal): Secondary | ICD-10-CM | POA: Diagnosis not present

## 2023-11-04 DIAGNOSIS — Z96651 Presence of right artificial knee joint: Secondary | ICD-10-CM | POA: Diagnosis present

## 2023-11-04 DIAGNOSIS — I130 Hypertensive heart and chronic kidney disease with heart failure and stage 1 through stage 4 chronic kidney disease, or unspecified chronic kidney disease: Secondary | ICD-10-CM | POA: Diagnosis present

## 2023-11-04 DIAGNOSIS — S0990XA Unspecified injury of head, initial encounter: Secondary | ICD-10-CM | POA: Diagnosis present

## 2023-11-04 LAB — GLUCOSE, CAPILLARY
Glucose-Capillary: 114 mg/dL — ABNORMAL HIGH (ref 70–99)
Glucose-Capillary: 123 mg/dL — ABNORMAL HIGH (ref 70–99)
Glucose-Capillary: 251 mg/dL — ABNORMAL HIGH (ref 70–99)
Glucose-Capillary: 187 mg/dL — ABNORMAL HIGH (ref 70–99)
Glucose-Capillary: 204 mg/dL — ABNORMAL HIGH (ref 70–99)

## 2023-11-04 LAB — CBC
HCT: 39.8 % (ref 36.0–46.0)
Hemoglobin: 13.1 g/dL (ref 12.0–15.0)
MCH: 30.1 pg (ref 26.0–34.0)
MCHC: 32.9 g/dL (ref 30.0–36.0)
MCV: 91.5 fL (ref 80.0–100.0)
Platelets: 158 K/uL (ref 150–400)
RBC: 4.35 MIL/uL (ref 3.87–5.11)
RDW: 13.3 % (ref 11.5–15.5)
WBC: 7.8 K/uL (ref 4.0–10.5)
nRBC: 0 % (ref 0.0–0.2)

## 2023-11-04 LAB — BASIC METABOLIC PANEL WITH GFR
Anion gap: 10 (ref 5–15)
BUN: 24 mg/dL — ABNORMAL HIGH (ref 8–23)
CO2: 22 mmol/L (ref 22–32)
Calcium: 7.7 mg/dL — ABNORMAL LOW (ref 8.9–10.3)
Chloride: 103 mmol/L (ref 98–111)
Creatinine, Ser: 1.33 mg/dL — ABNORMAL HIGH (ref 0.44–1.00)
GFR, Estimated: 40 mL/min — ABNORMAL LOW (ref >60)
Glucose, Bld: 114 mg/dL — ABNORMAL HIGH (ref 70–99)
Potassium: 4.6 mmol/L (ref 3.5–5.1)
Sodium: 135 mmol/L (ref 135–145)

## 2023-11-04 LAB — HEMOGLOBIN A1C
Hgb A1c MFr Bld: 5.9 % — ABNORMAL HIGH (ref 4.8–5.6)
Mean Plasma Glucose: 122.63 mg/dL

## 2023-11-04 MED ORDER — LIDOCAINE 5 % EX PTCH
1.0000 | MEDICATED_PATCH | CUTANEOUS | Status: DC
  Administered 2023-11-04: 1 via TRANSDERMAL
  Filled 2023-11-04: qty 1

## 2023-11-04 MED ORDER — ACETAMINOPHEN 500 MG PO TABS
500.0000 mg | ORAL_TABLET | Freq: Once | ORAL | Status: AC
  Administered 2023-11-04: 500 mg via ORAL
  Filled 2023-11-04: qty 1

## 2023-11-04 MED ORDER — METHOCARBAMOL 500 MG PO TABS
500.0000 mg | ORAL_TABLET | Freq: Once | ORAL | Status: AC
  Administered 2023-11-04: 500 mg via ORAL
  Filled 2023-11-04: qty 1

## 2023-11-04 MED ORDER — SODIUM CHLORIDE 0.9 % IV SOLN
2.0000 g | Freq: Two times a day (BID) | INTRAVENOUS | Status: DC
  Administered 2023-11-04 – 2023-11-05 (×3): 2 g via INTRAVENOUS
  Filled 2023-11-04 (×3): qty 12.5

## 2023-11-04 NOTE — Progress Notes (Addendum)
  Progress Note   Patient: Tammy [name] FMW:[id number] DOB: March 29, 1945 DOA: 11/03/2023     0 DOS: the patient was seen and examined on 11/04/2023   Brief hospital course: Tammy [name] is a 78 y.o. female with medical history significant of PAD status post left femoral artery PCI on Eliquis  and statin, HTN, HLD, mild intermittent asthma/COPD, chronic HFpEF, PSVT, CKD stage IIIb, nonobstructive CAD, lumbar stenosis status post fusion, chronic back pain, presented with generalized weakness and frequent falls at home.  Patient also had significant dysuria without hematuria.  Initially placed on Rocephin , urine culture grew Pseudomonas, antibiotic switched to cefepime .    Principal Problem:   Fall Active Problems:   UTI (urinary tract infection)   COPD (chronic obstructive pulmonary disease) (HCC)   CKD stage 3b, GFR 30-44 ml/min (HCC)   Impaired ambulation   Assessment and Plan: Weakness and a fall secondary to UTI. Pseudomonas urinary tract infection. Patient does not meet criteria for sepsis. Patient still feels very very weak, PT will see patient today.  Urine culture grew Pseudomonas, antibiotic switched to cefepime .  Continue to follow results. Patient may be able to discharge tomorrow.  COPD. Stable.  Chronic kidney stage IIIb. Renal function appears to be stable.      Subjective:  Patient still complains significant weakness.  Has low-grade fever of 100.4.  Physical Exam: Vitals:   11/03/23 1542 11/03/23 1941 11/04/23 0325 11/04/23 0821  BP: (!) 122/53 (!) 124/50 (!) 124/50 130/62  Pulse: (!) 55 64 66 66  Resp: 17 18 18 16   Temp: 97.7 F (36.5 C) 98.2 F (36.8 C) 98.1 F (36.7 C) 97.8 F (36.6 C)  TempSrc:  Oral  Oral  SpO2: 99% 100% 95% 97%  Weight:      Height:       General exam: Appears calm and comfortable  Respiratory system: Clear to auscultation. Respiratory effort normal. Cardiovascular system: S1 & S2 heard, RRR. No JVD, murmurs, rubs, gallops or  clicks. No pedal edema. Gastrointestinal system: Abdomen is nondistended, soft and nontender. No organomegaly or masses felt. Normal bowel sounds heard. Central nervous system: Alert and oriented. No focal neurological deficits. Extremities: Symmetric 5 x 5 power. Skin: No rashes, lesions or ulcers Psychiatry: Judgement and insight appear normal. Mood & affect appropriate.    Data Reviewed:  Lab results reviewed.  Family Communication: husband updated over phone  Disposition: Status is: Observation      Time spent: 35 minutes  Author: Murvin Mana, MD 11/04/2023 1:51 PM  For on call review [email or web address].

## 2023-11-04 NOTE — Hospital Course (Signed)
  Tammy [name] is a 78 y.o. female with medical history significant of PAD status post left femoral artery PCI on Eliquis  and statin, HTN, HLD, mild intermittent asthma/COPD, chronic HFpEF, PSVT, CKD stage IIIb, nonobstructive CAD, lumbar stenosis status post fusion, chronic back pain, presented with generalized weakness and frequent falls at home.  Patient also had significant dysuria without hematuria.  Initially placed on Rocephin , urine culture grew Pseudomonas, antibiotic switched to cefepime . Culture came back pansensitive, will treat with Cipro  for additional 5 days  Assessment and Plan: Weakness and a fall secondary to UTI. Pseudomonas urinary tract infection. Sepsis ruled out Patient does not meet criteria for sepsis. Patient still feels very very weak, PT will see patient today.  Urine culture grew Pseudomonas, antibiotic switched to cefepime .  Urine culture has finalized, pansensitive Pseudomonas.  Will treat with additional 5 days of Cipro .  Condition has improved.   COPD. Stable.   Chronic kidney stage IIIb. Renal function appears to be stable.   Likely type 2 diabetes. Patient has varied glucose, highest was 251, follow-up with PCP for treatment

## 2023-11-05 ENCOUNTER — Other Ambulatory Visit: Payer: Self-pay | Admitting: Family Medicine

## 2023-11-05 DIAGNOSIS — N1832 Chronic kidney disease, stage 3b: Secondary | ICD-10-CM | POA: Diagnosis not present

## 2023-11-05 DIAGNOSIS — W19XXXA Unspecified fall, initial encounter: Secondary | ICD-10-CM | POA: Diagnosis not present

## 2023-11-05 DIAGNOSIS — N3000 Acute cystitis without hematuria: Secondary | ICD-10-CM | POA: Diagnosis not present

## 2023-11-05 LAB — URINE CULTURE: Culture: >=100000 — AB

## 2023-11-05 LAB — GLUCOSE, CAPILLARY: Glucose-Capillary: 123 mg/dL — ABNORMAL HIGH (ref 70–99)

## 2023-11-05 MED ORDER — CIPROFLOXACIN HCL 500 MG PO TABS: 500.0000 mg | ORAL_TABLET | Freq: Two times a day (BID) | ORAL | 0 refills | Status: DC

## 2023-11-05 NOTE — TOC Initial Note (Signed)
 Transition of Care [facility]) - Initial/Assessment Note    Patient Details  Name: Tammy [name] MRN: [id number] Date of Birth: 11-May-1945  [facility]) CM/SW Contact:    [place] L Yahaira Bruski, LCSW Phone Number: 11/05/2023, 10:07 AM  Clinical Narrative:                  Patient discharging today. Discharge orders are in. [facility] orders are in. CSW spoke with patient to offer choice for [facility]. Patient received HH with [facility] in April. Patient advised that she would like work with [facility] again.   CSW spoke with Channing Kos, who confirmed that they can accept patient.   Patient is ready for discharge.        Patient Goals and CMS Choice            Expected Discharge Plan and Services         Expected Discharge Date: 11/05/23                                    Prior Living Arrangements/Services                       Activities of Daily Living   ADL Screening (condition at time of admission) Independently performs ADLs?: Yes (appropriate for developmental age) Is the patient deaf or have difficulty hearing?: No Does the patient have difficulty seeing, even when wearing glasses/contacts?: No Does the patient have difficulty concentrating, remembering, or making decisions?: No  Permission Sought/Granted                  Emotional Assessment              Admission diagnosis:  Dysuria [R30.0] Lactic acidosis [E87.20] Fall [W19.XXXA] Generalized weakness [R53.1] Injury of head, initial encounter [S09.90XA] Fall, initial encounter [W19.XXXA] Fever, unspecified fever cause [R50.9] UTI (urinary tract infection) [N39.0] Patient Active Problem List   Diagnosis Date Noted   Impaired ambulation 11/03/2023   Fixation hardware in spine 09/19/2022   Lumbar facet joint syndrome (Bilateral) 09/19/2022   Lumbar facet arthropathy (Multilevel) (Bilateral) 09/19/2022   Vitamin D  deficiency 09/04/2022   Osteoarthritis of knees (Bilateral) 08/21/2022    Lumbar facet joint pain 08/21/2022   Pharmacologic therapy 06/28/2022   Disorder of skeletal system 06/28/2022   Problems influencing health status 06/28/2022   Grade 1 Anterolisthesis of cervical spine (C3/C4, C4/C5) 06/28/2022   Abnormal MRI, cervical spine (11/16/2021) 06/28/2022   Cervical spinal stenosis (C5-6, C6-7) 06/28/2022   Cervical foraminal stenosis 06/28/2022   Cervical facet arthropathy 06/28/2022   Cervical facet hypertrophy (Multilevel) 06/28/2022   Abnormal x-ray of cervical spine (10/01/2020) 06/28/2022   Osteoarthritis of AC (acromioclavicular) joint (Left) 06/28/2022   Osteoarthritis of glenohumeral joint (Left) 06/28/2022   Osteoarthritis of shoulder (Left) 06/28/2022   Neurostimulator device in situ (T8-9 leads) 06/28/2022   Grade 1 Anterolisthesis of lumbar spine (L4/L5) 06/28/2022   Grade 1 Retrolisthesis of lumbar spine (L2/L3, L3/L4) 06/28/2022   DDD (degenerative disc disease), cervical 06/28/2022   DDD (degenerative disc disease), lumbar 06/28/2022   Abnormal MRI, lumbar spine (04/17/2017) 06/28/2022   History of lumbar spinal fusion 06/28/2022   Failed back surgical syndrome 06/28/2022   Abnormal CT scan, lumbar spine (02/14/2019) 06/28/2022   Osteoarthritis of knee (Right) 06/28/2022   Osteoarthritis of knee (Left) 06/28/2022   Chronic knee pain (3ry area of Pain) (Bilateral) (R>L) 06/28/2022  Coccygodynia 06/28/2022   Chronic lower extremity pain (2ry area of Pain) (Right) 06/28/2022   Leg cramps 06/28/2022   Chronic knee pain s/p TKR (Right) 06/28/2022   Chronic neck pain (4th area of Pain) (Bilateral) (R>L) 06/28/2022   Spinal cord stimulator dysfunction, sequela 06/28/2022   Body mass index (BMI) 33.0-33.9, adult 03/13/2022   Acute metabolic encephalopathy 03/08/2022   Personal history of pneumonia (recurrent) 02/24/2022   Primary cancer of left upper lobe of lung (HCC) 01/10/2022   Osteoarthritis of spine with radiculopathy, cervical region  10/20/2021   Osteoarthritis of spine with radiculopathy, thoracic region 10/20/2021   Acute respiratory failure with hypoxia (HCC) 06/09/2021   Chronic anticoagulation (Eliquis ) 06/09/2021   Elevated lipase 06/09/2021   Stage 3b chronic kidney disease (HCC) 06/08/2021   Long term (current) use of opiate analgesic 03/13/2021   Atherosclerosis of native arteries of extremity with intermittent claudication (HCC) 02/15/2021   Cervical radiculopathy 10/14/2020   Sacroiliac inflammation (HCC) 10/14/2020   Lung nodule 08/19/2020   Fall 04/17/2020   Anemia 02/16/2020   Asthma 02/16/2020   History of cardiac catheterization 02/16/2020   Left breast cancer s/p mastectomy 02/16/2020   Malignant tumor of breast (HCC) 02/16/2020   History of total knee arthroplasty 11/18/2019   Unstable angina (HCC) 04/03/2019   Dysphagia    Thickening of esophagus    Stomach irritation    Gastric polyp    Positive occult stool blood test    Polyp of colon    Proteinuria 11/27/2018   Carcinoma of lower-inner quadrant of left breast in female, estrogen receptor positive (HCC) 10/02/2018   Chronic knee pain (Right) 08/01/2017   Chronic low back pain (1ry area of Pain) (Bilateral) (R>L) w/o sciatica 05/28/2017   Gait abnormality 05/28/2017   Hypothyroid 07/18/2016   COPD (chronic obstructive pulmonary disease) (HCC) 07/18/2016   Encephalomalacia on imaging study 12/10/2015   Calcification of both carotid arteries 12/10/2015   Benign neoplasm of sigmoid colon    Benign neoplasm of cecum    Benign neoplasm of ascending colon    Bilateral atelectasis 04/11/2015   Fatty liver disease, nonalcoholic 04/11/2015   Hydronephrosis of right kidney 04/11/2015   Abdominal aortic ectasia (HCC) 04/11/2015   Constipation 04/07/2015   Abdominal aortic atherosclerosis (HCC) 12/18/2014   Diverticulosis of colon 12/18/2014   Intermittent left-sided chest pain 11/18/2014   Rhinitis, allergic 10/20/2014   Coronary artery  disease    Essential hypertension    Morbid obesity due to excess calories (HCC)    Herniated nucleus pulposus 06/06/2014   Neuralgia neuritis, sciatic nerve 05/24/2014   Pulmonary embolism (HCC) 01/30/2014   UTI (urinary tract infection) 11/03/2013   Overactive bladder 11/03/2013   Coronary atherosclerosis of native coronary artery 06/19/2013   Exertional dyspnea 06/19/2013   History of back surgery 06/19/2013   Hyperlipidemia 06/19/2013   PAD (peripheral artery disease) (HCC) 06/19/2013   Congenital obstruction of ureteropelvic junction 11/21/2011   Hyperkalemia 03/14/1999   Personal history of pulmonary embolism 03/14/1999   Type 2 diabetes mellitus with diabetic peripheral angiopathy without gangrene (HCC) 03/14/1999   Type 2 diabetes mellitus with diabetic chronic kidney disease (HCC) 03/14/1999   Atherosclerosis of aorta (HCC) 03/14/1999   Obesity, unspecified 03/14/1999   Long term (current) use of inhaled steroids 03/14/1999   Coronary artery disease involving native coronary artery of native heart without angina pectoris 03/14/1999   Other disorders of phosphorus metabolism 03/14/1999   Chronic pain syndrome 03/14/1999   Metabolic encephalopathy 03/14/1999  Long term (current) use of anticoagulants 03/14/1999   Hypomagnesemia 03/14/1999   Hypertensive chronic kidney disease w stg 1-4/unsp chr kdny 03/14/1999   CKD stage 3b, GFR 30-44 ml/min (HCC) 03/14/1999   Dvrtclos of intest, part unsp, w/o perf or abscess w/o bleed 03/14/1999   Emphysema, unspecified (HCC) 03/14/1999   Fatty (change of) liver, not elsewhere classified 03/14/1999   Other disorders of plasma-protein metabolism, not elsewhere classified 03/14/1999   Other chronic pain 03/14/1999   Long term (current) use of insulin  (HCC) 03/14/1999   PCP:  Vicci Duwaine SQUIBB, DO Pharmacy:   [facility] - [place], [place] - [address] [place] [address] Phone: [phone] Fax:  [phone]  [facility] - [place], [place] - [address] [place] [address] Phone: [phone] Fax: [phone]  [facility] [phone] - [place], [place] - [address] [place] [address] Phone: [phone] Fax: [phone]  [facility] [address] [place] [address] Phone: [phone] Fax: [phone]     Social Drivers of Health (SDOH) Social History: SDOH Screenings   Food Insecurity: No Food Insecurity (11/03/2023)  Housing: Low Risk  (11/03/2023)  Transportation Needs: No Transportation Needs (11/03/2023)  Utilities: Not At Risk (11/03/2023)  Alcohol  Screen: Low Risk  (04/12/2023)  Depression (PHQ2-9): Low Risk  (06/19/2023)  Financial Resource Strain: Low Risk  (04/12/2023)  Physical Activity: Inactive (04/12/2023)  Social Connections: Unknown (11/03/2023)  Stress: Stress Concern Present (04/12/2023)  Tobacco Use: Medium Risk (11/03/2023)  Health Literacy: Adequate Health Literacy (04/12/2023)   SDOH Interventions:     Readmission Risk Interventions     No data to display

## 2023-11-05 NOTE — TOC Transition Note (Signed)
 Transition of Care [facility] - Discharge Note   Patient Details  Name: Tammy [name] MRN: [id number] Date of Birth: Dec 15, 1945  Transition [facility]) CM/SW Contact:  [place] L Breeonna Mone, LCSW Phone Number: 11/05/2023, 10:19 AM   Clinical Narrative:     CSW set up Home Care services through [facility]. No other TOC needs identified. Patient did not require DME.   TOC signing off.   Final next level of care: Home w Home Health Services Barriers to Discharge: No Barriers Identified   Patient Goals and CMS Choice   CMS Medicare.gov Compare Post Acute Care list provided to:: Patient Choice offered to / list presented to : Patient [place] ownership interest in [facility].provided to:: Patient    Discharge Placement                    Patient and family notified of of transfer: 11/05/23  Discharge Plan and Services Additional resources added to the After Visit Summary for                    DME Agency: NA       HH Arranged: RN, PT, OT [facility] Agency: [facility] Date [facility] Agency Contacted: 11/05/23 Time HH Agency Contacted: [phone] Representative spoke with at [facility] Agency: Channing  Social Drivers of Health (SDOH) Interventions SDOH Screenings   Food Insecurity: No Food Insecurity (11/03/2023)  Housing: Low Risk  (11/03/2023)  Transportation Needs: No Transportation Needs (11/03/2023)  Utilities: Not At Risk (11/03/2023)  Alcohol  Screen: Low Risk  (04/12/2023)  Depression (PHQ2-9): Low Risk  (06/19/2023)  Financial Resource Strain: Low Risk  (04/12/2023)  Physical Activity: Inactive (04/12/2023)  Social Connections: Unknown (11/03/2023)  Stress: Stress Concern Present (04/12/2023)  Tobacco Use: Medium Risk (11/03/2023)  Health Literacy: Adequate Health Literacy (04/12/2023)     Readmission Risk Interventions     No data to display

## 2023-11-05 NOTE — Discharge Summary (Signed)
 Physician Discharge Summary   Patient: Tammy [name] MRN: [id number] DOB: 1945-12-25  Admit date:     11/03/2023  Discharge date: 11/05/23  Discharge Physician: Murvin Mana   PCP: Vicci Duwaine SQUIBB, DO   Recommendations at discharge:   Follow-up with PCP in 1 week.  Discharge Diagnoses: Principal Problem:   Fall Active Problems:   UTI (urinary tract infection)   COPD (chronic obstructive pulmonary disease) (HCC)   CKD stage 3b, GFR 30-44 ml/min (HCC)   Impaired ambulation Chronic diastolic congestive heart failure History of PSVT. Essential hypertension. Peripheral arterial disease status post intervention. Nonobstructive coronary disease. Chronic back pain. Resolved Problems:   * No resolved hospital problems. *  Hospital Course:  Tammy [name] is a 78 y.o. female with medical history significant of PAD status post left femoral artery PCI on Eliquis  and statin, HTN, HLD, mild intermittent asthma/COPD, chronic HFpEF, PSVT, CKD stage IIIb, nonobstructive CAD, lumbar stenosis status post fusion, chronic back pain, presented with generalized weakness and frequent falls at home.  Patient also had significant dysuria without hematuria.  Initially placed on Rocephin , urine culture grew Pseudomonas, antibiotic switched to cefepime . Culture came back pansensitive, will treat with Cipro  for additional 5 days  Assessment and Plan: Weakness and a fall secondary to UTI. Pseudomonas urinary tract infection. Sepsis ruled out Patient does not meet criteria for sepsis. Patient still feels very very weak, PT will see patient today.  Urine culture grew Pseudomonas, antibiotic switched to cefepime .  Urine culture has finalized, pansensitive Pseudomonas.  Will treat with additional 5 days of Cipro .  Condition has improved.   COPD. Stable.   Chronic kidney stage IIIb. Renal function appears to be stable.   Likely type 2 diabetes. Patient has varied glucose, highest was 251, follow-up with PCP  for treatment          Consultants: None Procedures performed: None  Disposition: HH Diet recommendation:  Discharge Diet Orders (From admission, onward)     Start     Ordered   11/05/23 0000  Diet - low sodium heart healthy        11/05/23 0919           Cardiac diet DISCHARGE MEDICATION: Allergies as of 11/05/2023       Reactions   Hydrocodone  Other (See Comments)   Unresponsive   Aleve [naproxen Sodium]    Hives & itching    Contrast Media [iodinated Contrast Media]    Pt avoids due to kidney issues   Ioxaglate Other (See Comments)   (iodine ) Pt avoids due to kidney issues   Ranexa [ranolazine]    Sick on stomach   Sulfa Antibiotics Other (See Comments)   Pt avoids due to kidney issues        Medication List     STOP taking these medications    cephALEXin  500 MG capsule Commonly known as: KEFLEX        TAKE these medications    acetaminophen  500 MG tablet Commonly known as: TYLENOL  Take 500 mg by mouth every 6 (six) hours as needed for moderate pain. Takes up to 3 tablets/day PRN for increased pain   albuterol  108 (90 Base) MCG/ACT inhaler Commonly known as: VENTOLIN  HFA Inhale 2 puffs into the lungs every 6 (six) hours as needed for wheezing or shortness of breath.   anastrozole  1 MG tablet Commonly known as: ARIMIDEX  TAKE 1 TABLET BY MOUTH ONCE DAILY   apixaban  2.5 MG Tabs tablet Commonly known as: Eliquis  Take 1  tablet (2.5 mg total) by mouth 2 (two) times daily.   AZO BLADDER CONTROL/GO-LESS PO Take 1 tablet by mouth in the morning and at bedtime.   Calcium  600 + D 600-200 MG-UNIT Tabs Generic drug: Calcium  Carb-Cholecalciferol  Take 1 tablet by mouth daily.   cetirizine  10 MG tablet Commonly known as: ZYRTEC  TAKE 1 TABLET BY MOUTH DAILY   cholecalciferol  25 MCG (1000 UNIT) tablet Commonly known as: VITAMIN D3 Take 1,000 Units by mouth daily.   ciprofloxacin  500 MG tablet Commonly known as: Cipro  Take 1 tablet (500 mg  total) by mouth 2 (two) times daily for 5 days.   CRANBERRY PO Take 2 capsules by mouth daily.   DULoxetine  60 MG capsule Commonly known as: CYMBALTA  TAKE 2 CAPSULES BY MOUTH ONCE DAILY   fluticasone  50 MCG/ACT nasal spray Commonly known as: FLONASE  Place 1 spray into both nostrils daily.   FreeStyle Libre 3 Plus Sensor Misc 1 each by Does not apply route. Change sensor every 15 days.   FreeStyle Libre 3 Reader [place] by Does not apply route.   gabapentin  400 MG capsule Commonly known as: NEURONTIN  Take 2 capsules (800 mg total) by mouth 3 (three) times daily.   levothyroxine  125 MCG tablet Commonly known as: Synthroid  Take 1 tablet (125 mcg total) by mouth daily before breakfast.   metoprolol  tartrate 25 MG tablet Commonly known as: LOPRESSOR  Take 1 tablet (25 mg total) by mouth 2 (two) times daily.   montelukast  10 MG tablet Commonly known as: SINGULAIR  TAKE 1 TABLET BY MOUTH AT BEDTIME   Myrbetriq  50 MG Tb24 tablet Generic drug: mirabegron  ER TAKE 1 TABLET BY MOUTH DAILY   nitroGLYCERIN  0.4 MG SL tablet Commonly known as: NITROSTAT  Place 1 tablet (0.4 mg total) under the tongue every 5 (five) minutes as needed for chest pain. Total of 3 doses   ondansetron  4 MG tablet Commonly known as: Zofran  Take 1 tablet (4 mg total) by mouth every 8 (eight) hours as needed for nausea or vomiting.   pantoprazole  40 MG tablet Commonly known as: PROTONIX  TAKE 1 TABLET BY MOUTH ONCE DAILY   rosuvastatin  40 MG tablet Commonly known as: CRESTOR  Take 1 tablet (40 mg total) by mouth daily.   tirzepatide  7.5 MG/0.5ML Pen Commonly known as: MOUNJARO  Inject 7.5 mg into the skin once a week.   Trelegy Ellipta  100-62.5-25 MCG/ACT Aepb Generic drug: Fluticasone -Umeclidin-Vilant INHALE 1 PUFF INTO THE LUNGS DAILY   ZINC  CITRATE PO Take 1 tablet by mouth daily.        Follow-up Information     Vicci Bouchard P, DO Follow up in 1 week(s).   Specialty: Family  Medicine Contact information: [address] [place] [address] [phone]                Discharge Exam: Filed Weights   11/03/23 1326  Weight: 71.7 kg   General exam: Appears calm and comfortable  Respiratory system: Clear to auscultation. Respiratory effort normal. Cardiovascular system: S1 & S2 heard, RRR. No JVD, murmurs, rubs, gallops or clicks. No pedal edema. Gastrointestinal system: Abdomen is nondistended, soft and nontender. No organomegaly or masses felt. Normal bowel sounds heard. Central nervous system: Alert and oriented. No focal neurological deficits. Extremities: Symmetric 5 x 5 power. Skin: No rashes, lesions or ulcers Psychiatry: Judgement and insight appear normal. Mood & affect appropriate.    Condition at discharge: good  The results of significant diagnostics from this hospitalization (including imaging, microbiology, ancillary and laboratory) are listed  below for reference.   Imaging Studies: CT CHEST ABDOMEN PELVIS WO CONTRAST Result Date: 11/03/2023 CLINICAL DATA:  Blunt polytrauma, questionable sepsis. Fall injury, not feeling well. EXAM: CT CHEST, ABDOMEN AND PELVIS WITHOUT CONTRAST TECHNIQUE: Multidetector CT imaging of the chest, abdomen and pelvis was performed following the standard protocol without IV contrast. RADIATION DOSE REDUCTION: This exam was performed according to the departmental dose-optimization program which includes automated exposure control, adjustment of the mA and/or kV according to patient size and/or use of iterative reconstruction technique. COMPARISON:  Portable chest from today, chest radiograph 03/10/2022, chest CTs without contrast recently 10/16/2023, last year 07/12/2022, and CT abdomen and pelvis without contrast from 04/11/2022. FINDINGS: CT CHEST FINDINGS Cardiovascular: There is mild cardiomegaly. Three-vessel coronary calcification heaviest in the LAD. Diffuse aortic atherosclerosis and moderate patchy calcification in  the great vessels. The aorta is normal caliber until the distal third of the thoracic aorta to the hiatal segment, which is mildly dilated measuring up to 4 cm. This was seen previously.  The descending segment is tortuous. Pulmonary arteries and veins are normal caliber. No pericardial effusion. Mediastinum/Nodes: No enlarged mediastinal, hilar, or axillary lymph nodes. Thyroid  gland, trachea, and esophagus demonstrate no acute significant findings. The esophagus is patulous but no more than previously. Lungs/Pleura: The lungs are moderately emphysematous with centrilobular changes predominating, paraseptal changes in the upper apices. There is scarring in the lung apices, linear scarring left upper lobe. Atelectasis or scarring posteriorly right middle lobe. Diffuse bronchial thickening. Occasional subsegmental bronchial impactions noted right lower lobe. There is increased opacity in the posterior right lower lobe which could be atelectasis or pneumonia. Not seen previously. Few tiny lung nodules described previously are unchanged. No suspicious or new nodule. No pleural effusion. Rest of the lungs are clear.  There is no pneumothorax. 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 No acute regional skeletal fracture is suspected. Mild degenerative change thoracic spine. Spinal stimulator wiring enters the dorsal spinal canal at T10-11 terminating at T8-9. CT ABDOMEN PELVIS FINDINGS Hepatobiliary: No focal liver abnormality is seen. Status post cholecystectomy. No biliary dilatation. Pancreas: Partially atrophic. Otherwise unremarkable without contrast. Spleen: Unremarkable without contrast.  No splenomegaly. Adrenals/Urinary Tract: There is no adrenal mass or hemorrhage. No perinephric hemorrhage is seen or acute renal injury without contrast. Chronic asymmetric atrophy and scarring right kidney with chronic hydronephrosis. No ureteral dilatation or stones are seen bilaterally.  Bladder is unremarkable. Stomach/Bowel: Chronic 3.7 cm periampullary duodenal diverticulum with fluid level. Normal caliber unopacified small bowel surgically absent appendix. Mobile cecum located anterior midline upper abdomen. No wall thickening or dilatation of the colon. There is advanced diverticulosis in the descending and sigmoid segments. No evidence of acute diverticulitis. Vascular/Lymphatic: Aortic atherosclerosis. No enlarged abdominal or pelvic lymph nodes. Reproductive: Uterus and bilateral adnexa are unremarkable. Other: No abdominal wall hematoma or fluid collection. No incarcerated hernias. There is no mesenteric contusion. There is no free fluid, free hemorrhage or free air. Musculoskeletal: L2-S1 dorsal fusion hardware again is noted. No regional skeletal fracture is seen. There is osteopenia. Neurostimulator battery left flank. IMPRESSION: 1. No acute trauma related findings in the chest, abdomen or pelvis. 2. COPD with bronchitis and occasional subsegmental bronchial impactions in the right lower lobe. 3. Increased opacity in the posterior right lower lobe which could be atelectasis or pneumonia. 4. Aortic and coronary artery atherosclerosis.  Mild cardiomegaly. 5. 4 cm distal thoracic aortic aneurysm, unchanged. 6. Chronic asymmetric atrophy and scarring right kidney with chronic  hydronephrosis. 7. Diverticulosis without evidence of diverticulitis. 8. Osteopenia, postsurgical and degenerative change. 9. 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 Aortic Atherosclerosis (ICD10-I70.0) and Emphysema (ICD10-J43.9). Electronically Signed   By: Francis Quam M.D.   On: 11/03/2023 07:54   CT L-SPINE NO CHARGE Result Date: 11/03/2023 CLINICAL DATA:  78 year old female status post fall from chair striking head. Pain. Malaise. EXAM: CT LUMBAR SPINE WITHOUT CONTRAST TECHNIQUE: Technique: Multiplanar CT images of the lumbar spine were reconstructed from contemporary CT of the  Abdomen and Pelvis. RADIATION DOSE REDUCTION: This exam was performed according to the departmental dose-optimization program which includes automated exposure control, adjustment of the mA and/or kV according to patient size and/or use of iterative reconstruction technique. CONTRAST:  None COMPARISON:  CT Thoracic spine, Chest, Abdomen, and Pelvis today reported separately. Previous lumbar spine CT 02/14/2019. Lumbar MRI 04/17/2017. FINDINGS: Segmentation: Normal, the same numbering system used on the 2020 CT. Alignment: Stable chronic straightening of lumbar lordosis. Minimal levoconvex lumbar curvature. Vertebrae: Osteopenia. Chronic spinal hardware L2 through S1. Postoperative details are below. Stable lumbar vertebral height since 2020. Visible sacrum and SI joints appear intact. Paraspinal and other soft tissues: Abdomen and pelvis reported separately today. Chronic left flank thoracic spinal stimulator device. Chronic postoperative changes to the lumbar paraspinal soft tissues. Disc levels: L1-L2: Mild disc bulging. Moderate facet hypertrophy. No significant stenosis. L2-L3: Chronic posterior and interbody fusion. No hardware loosening. Evidence of developing arthrodesis. L3-L4: Chronic posterior and interbody fusion. No hardware loosening. Although medial course of the right L3 pedicle screw (series 5, image 77), stable since 2020. Chronic L3 endplate subsidence. Some evidence of arthrodesis, most pronounced at the right posterior elements. L4-L5: Chronic posterior and interbody fusion. No evidence of hardware loosening. Chronic mild endplate subsidence. Evidence of some posterior element arthrodesis primarily on the right. Residual foraminal disc osteophyte complex greater on the right. This level appears stable. L5-S1: Chronic decompression and fusion. No evidence of hardware loosening. Evidence of solid arthrodesis. Stable residual foraminal endplate spurring. IMPRESSION: 1. No acute traumatic injury  identified in the Lumbar Spine. 2. Chronic postoperative sequelae L2 through S1 posterior and interbody fusion appears stable from a 2020 CT. No definite pseudoarthrosis. 3. Adjacent segment disease at L1-L2 without significant spinal stenosis by CT. 4. CT Abdomen and Pelvis today reported separately. Electronically Signed   By: VEAR Hurst M.D.   On: 11/03/2023 07:31   CT T-SPINE NO CHARGE Result Date: 11/03/2023 CLINICAL DATA:  78 year old female status post fall from chair striking head. Pain. Malaise. EXAM: CT THORACIC SPINE WITHOUT CONTRAST TECHNIQUE: Multiplanar CT images of the thoracic spine were reconstructed from contemporary CT of the Chest. RADIATION DOSE REDUCTION: This exam was performed according to the departmental dose-optimization program which includes automated exposure control, adjustment of the mA and/or kV according to patient size and/or use of iterative reconstruction technique. CONTRAST:  None COMPARISON:  CT Chest, Abdomen, and Pelvis today are reported separately. Cervical spine CT today. Thoracic spine MRI 11/15/2021. FINDINGS: Limited cervical spine imaging:  Reported separately today. Thoracic spine segmentation: Appears normal, same numbering system used on the previous MRI. Alignment: Stable since 2023 and relatively normal thoracic kyphosis. There is mild upper thoracic dextroconvex scoliosis. No significant spondylolisthesis. Vertebrae: Maintained thoracic vertebral body height. No thoracic vertebral fracture identified. Visible posterior ribs also appear intact. Paraspinal and other soft tissues: Chest and abdomen reported separately today. Chronic thoracic spinal stimulator device entering the posterior spinal canal at T10-T11, present on previous MRI. Electrodes  course superficially toward the left flank. Otherwise negative thoracic paraspinal soft tissues. Disc levels: Thoracic spine degeneration appears mild for age and stable from the 2023 MRI. IMPRESSION: 1. No acute  traumatic injury identified in the Thoracic Spine. 2. Chronic thoracic spinal stimulator device. And mild for age thoracic spine degeneration which appears stable from a 2023 MRI. 3.  CT Chest, Abdomen, and Pelvis today are reported separately. Electronically Signed   By: VEAR Hurst M.D.   On: 11/03/2023 07:05   CT Cervical Spine Wo Contrast Result Date: 11/03/2023 CLINICAL DATA:  78 year old female status post fall from chair striking head. Pain. Malaise. EXAM: CT CERVICAL SPINE WITHOUT CONTRAST TECHNIQUE: Multidetector CT imaging of the cervical spine was performed without intravenous contrast. Multiplanar CT image reconstructions were also generated. RADIATION DOSE REDUCTION: This exam was performed according to the departmental dose-optimization program which includes automated exposure control, adjustment of the mA and/or kV according to patient size and/or use of iterative reconstruction technique. COMPARISON:  MRI cervical spine 11/15/2021. FINDINGS: Alignment: Stable since 2023. Straightening of cervical lordosis plus mild degenerative appearing anterolisthesis at C3-C4, C4-C5, C5-C6. Underlying mild levoconvex cervical scoliosis. Maintained cervicothoracic and posterior element alignment. Skull base and vertebrae: Bone mineralization is within normal limits for age. Visualized skull base is intact. No atlanto-occipital dissociation. C1 and C2 appear intact and aligned. No acute osseous abnormality identified. Soft tissues and spinal canal: No prevertebral fluid or swelling. No visible canal hematoma. Calcified left carotid bifurcation atherosclerosis. Negative visible other noncontrast neck soft tissues. Disc levels: Chronic cervical spine disease including bilateral facet arthropathy in association with chronic upper cervical degenerative spondylolisthesis. Chronic disc and endplate degeneration C5-C6 and C6-C7. Spinal and foraminal stenosis at those levels, appears not significantly changed from previous  MRI. Upper chest: Thoracic spine CT today reported separately. Centrilobular and paraseptal emphysema in the lung apices. IMPRESSION: 1. No acute traumatic injury identified in the cervical spine. 2. Chronic cervical spine degeneration appears stable from the 2023 MRI. 3.  Emphysema (ICD10-J43.9). Electronically Signed   By: VEAR Hurst M.D.   On: 11/03/2023 07:00   CT HEAD WO CONTRAST ( ) Result Date: 11/03/2023 CLINICAL DATA:  78 year old female status post fall from chair striking head. Pain. Malaise. EXAM: CT HEAD WITHOUT CONTRAST TECHNIQUE: Contiguous axial images were obtained from the base of the skull through the vertex without intravenous contrast. RADIATION DOSE REDUCTION: This exam was performed according to the departmental dose-optimization program which includes automated exposure control, adjustment of the mA and/or kV according to patient size and/or use of iterative reconstruction technique. COMPARISON:  Brain MRI 10/03/2018.  Head CT 03/08/2022. FINDINGS: Brain: Cerebral volume not significantly changed since 2023. Chronic encephalomalacia left inferior frontal gyrus, stable. No midline shift, ventriculomegaly, mass effect, evidence of mass lesion, intracranial hemorrhage or evidence of cortically based acute infarction. Patchy other scattered cerebral white matter hypodensity appears stable since 2023. Vascular: No suspicious intracranial vascular hyperdensity. Calcified atherosclerosis at the skull base. Skull: Appears stable and intact. Sinuses/Orbits: Visualized paranasal sinuses and mastoids are stable and well aerated. Other: Mild left forehead scalp contusion or hematoma on series 3, image 48. No scalp soft tissue gas. Underlying calvarium appears intact. Orbits soft tissues appears stable and negative. IMPRESSION: 1. Mild left forehead scalp soft tissue injury.  No skull fracture. 2. No acute intracranial abnormality. Stable since 2023 left inferior frontal gyrus encephalomalacia,  scattered chronic white matter disease. Electronically Signed   By: VEAR Hurst M.D.   On: 11/03/2023 06:57   DG  Knee 2 Views Left Result Date: 11/03/2023 CLINICAL DATA:  Fall with left knee pain. EXAM: LEFT KNEE - 1-2 VIEW COMPARISON:  Study of 02/25/2018. FINDINGS: AP Lat two views. Small suprapatellar bursal effusion. Osteopenia noted without evidence of fracture or dislocation. The There is moderate 2 advanced narrowing and osteophytosis of the medial joint compartment, slight spurring and narrowing in the patellofemoral joint. No narrowing in the lateral compartment. No other focal bone abnormality is seen. There is calcification in the distal superficial femoral artery. Soft tissues otherwise unremarkable. Comparison to the prior study reveals no significant interval change. IMPRESSION: 1. Osteopenia and degenerative change without evidence of fractures. 2. Small suprapatellar bursal effusion. 3. Atherosclerosis. Electronically Signed   By: Francis Quam M.D.   On: 11/03/2023 06:00   DG Chest Port 1 View Result Date: 11/03/2023 CLINICAL DATA:  Fall.  Not feeling well.  Possible sepsis. EXAM: PORTABLE CHEST 1 VIEW COMPARISON:  03/10/2022 FINDINGS: The cardio pericardial silhouette is enlarged. Chronic linear atelectasis or scarring in the left mid lung is similar to prior. No dense focal airspace consolidation or evidence of pulmonary edema. No substantial pleural effusion. No acute bony abnormality. Thoracic spinal stimulator device evident. IMPRESSION: Chronic atelectasis or scarring in the left mid lung. No acute cardiopulmonary findings. Electronically Signed   By: Camellia Candle M.D.   On: 11/03/2023 05:40   CT CHEST WO CONTRAST Result Date: 10/16/2023 CLINICAL DATA:  Follow-up left upper lobe non-small cell lung cancer. Patient complains of mid chest pain and left-sided chest pain with soreness. History of left breast cancer with lumpectomy and radiation. * Tracking Code: BO * EXAM: CT CHEST WITHOUT  CONTRAST TECHNIQUE: Multidetector CT imaging of the chest was performed following the standard protocol without IV contrast. RADIATION DOSE REDUCTION: This exam was performed according to the departmental dose-optimization program which includes automated exposure control, adjustment of the mA and/or kV according to patient size and/or use of iterative reconstruction technique. COMPARISON:  Multiple priors including CT Jul 12, 2022 FINDINGS: Cardiovascular: Aortic atherosclerosis. Three-vessel coronary artery calcifications. Normal size heart. No significant pericardial effusion/thickening. Mediastinum/Nodes: Stable product thoracic lymph nodes for instance a precarinal lymph node measuring 10 mm in short axis on image 61/2, unchanged. Gas fluid levels in a patulous esophagus. Lungs/Pleura: Stable scattered pulmonary nodules. No new suspicious pulmonary nodules or masses. For reference: -2 mm right upper lobe pulmonary nodule on image 33/3, unchanged. -3 mm right upper lobe pulmonary nodule on image 73/3, unchanged -3 mm right lower lobe pulmonary nodule on image. 10/3, unchanged Scattered atelectasis/scarring. Mild emphysema. Biapical pleuroparenchymal scarring. Upper Abdomen: Cholecystectomy Musculoskeletal: Chronic nonunion of left third and fourth lateral rib fractures unchanged from prior. No new aggressive lytic or blastic lesion of bone. Surgical changes in the left breast. Posterior lumbar fusion hardware. Thoracic intrathecal stimulator lead. IMPRESSION: 1. Stable scattered pulmonary nodules. No new suspicious pulmonary nodules or masses. 2. Stable prominent mediastinal lymph nodes. 3. Chronic nonunion of remote left lateral third and fourth rib fractures. 4. Gas fluid levels in a patulous esophagus, suggestive of gastroesophageal reflux. 5. Aortic Atherosclerosis (ICD10-I70.0) and Emphysema (ICD10-J43.9). Electronically Signed   By: Reyes Holder M.D.   On: 10/16/2023 14:54    Microbiology: Results for  orders placed or performed during the hospital encounter of 11/03/23  Blood Culture (routine x 2)     Status: None (Preliminary result)   Collection Time: 11/03/23  4:49 AM   Specimen: BLOOD  Result Value Ref Range Status   Specimen Description BLOOD  RIGHT ANTECUBITAL  Final   Special Requests   Final    BOTTLES DRAWN AEROBIC ONLY Blood Culture results may not be optimal due to an inadequate volume of blood received in culture bottles   Culture   Final    NO GROWTH 2 DAYS Performed at [facility], [address] [place], [place] [address]    Report Status PENDING  Incomplete  Blood Culture (routine x 2)     Status: None (Preliminary result)   Collection Time: 11/03/23  6:29 AM   Specimen: BLOOD  Result Value Ref Range Status   Specimen Description BLOOD BLOOD RIGHT HAND  Final   Special Requests   Final    BOTTLES DRAWN AEROBIC ONLY Blood Culture results may not be optimal due to an inadequate volume of blood received in culture bottles   Culture   Final    NO GROWTH 2 DAYS Performed at [facility], [address] [place], [place] [address]    Report Status PENDING  Incomplete  Urine Culture     Status: Abnormal   Collection Time: 11/03/23  7:40 AM   Specimen: Urine, Clean Catch  Result Value Ref Range Status   Specimen Description   Final    URINE, CLEAN CATCH Performed at [facility], [address] [place], [place] [address]    Special Requests   Final    NONE Performed at [facility] [address], [place], [place] [address]    Culture >=100,000 COLONIES/mL PSEUDOMONAS AERUGINOSA (A)  Final   Report Status 11/05/2023 FINAL  Final   Organism ID, Bacteria PSEUDOMONAS AERUGINOSA (A)  Final      Susceptibility   Pseudomonas aeruginosa - MIC*    MEROPENEM 1 SENSITIVE Sensitive     CIPROFLOXACIN  0.5 SENSITIVE Sensitive     IMIPENEM 1 SENSITIVE Sensitive     PIP/TAZO Value in next row Sensitive ug/mL     16 SENSITIVEThis is a  modified FDA-approved test that has been validated and its performance characteristics determined by the reporting laboratory.  This laboratory is certified under the Clinical Laboratory Improvement Amendments CLIA as qualified to perform high complexity clinical laboratory testing.    CEFEPIME  Value in next row Sensitive      16 SENSITIVEThis is a modified FDA-approved test that has been validated and its performance characteristics determined by the reporting laboratory.  This laboratory is certified under the Clinical Laboratory Improvement Amendments CLIA as qualified to perform high complexity clinical laboratory testing.    CEFTAZIDIME/AVIBACTAM Value in next row Sensitive ug/mL     16 SENSITIVEThis is a modified FDA-approved test that has been validated and its performance characteristics determined by the reporting laboratory.  This laboratory is certified under the Clinical Laboratory Improvement Amendments CLIA as qualified to perform high complexity clinical laboratory testing.    CEFTOLOZANE/TAZOBACTAM Value in next row Sensitive ug/mL     16 SENSITIVEThis is a modified FDA-approved test that has been validated and its performance characteristics determined by the reporting laboratory.  This laboratory is certified under the Clinical Laboratory Improvement Amendments CLIA as qualified to perform high complexity clinical laboratory testing.    TOBRAMYCIN Value in next row Sensitive      16 SENSITIVEThis is a modified FDA-approved test that has been validated and its performance characteristics determined by the reporting laboratory.  This laboratory is certified under the Clinical Laboratory Improvement Amendments CLIA as qualified to perform high complexity clinical laboratory testing.    CEFTAZIDIME Value  in next row Sensitive      16 SENSITIVEThis is a modified FDA-approved test that has been validated and its performance characteristics determined by the reporting laboratory.  This  laboratory is certified under the Clinical Laboratory Improvement Amendments CLIA as qualified to perform high complexity clinical laboratory testing.    * >=100,000 COLONIES/mL PSEUDOMONAS AERUGINOSA  Respiratory (~20 pathogens) panel by PCR     Status: None   Collection Time: 11/03/23 11:00 AM   Specimen: Nasopharyngeal Swab; Respiratory  Result Value Ref Range Status   Adenovirus NOT DETECTED NOT DETECTED Final   Coronavirus 229E NOT DETECTED NOT DETECTED Final    Comment: (NOTE) The Coronavirus on the Respiratory Panel, DOES NOT test for the novel  Coronavirus (2019 nCoV)    Coronavirus HKU1 NOT DETECTED NOT DETECTED Final   Coronavirus NL63 NOT DETECTED NOT DETECTED Final   Coronavirus OC43 NOT DETECTED NOT DETECTED Final   Metapneumovirus NOT DETECTED NOT DETECTED Final   Rhinovirus / Enterovirus NOT DETECTED NOT DETECTED Final   Influenza A NOT DETECTED NOT DETECTED Final   Influenza B NOT DETECTED NOT DETECTED Final   Parainfluenza Virus 1 NOT DETECTED NOT DETECTED Final   Parainfluenza Virus 2 NOT DETECTED NOT DETECTED Final   Parainfluenza Virus 3 NOT DETECTED NOT DETECTED Final   Parainfluenza Virus 4 NOT DETECTED NOT DETECTED Final   Respiratory Syncytial Virus NOT DETECTED NOT DETECTED Final   Bordetella pertussis NOT DETECTED NOT DETECTED Final   Bordetella Parapertussis NOT DETECTED NOT DETECTED Final   Chlamydophila pneumoniae NOT DETECTED NOT DETECTED Final   Mycoplasma pneumoniae NOT DETECTED NOT DETECTED Final    Comment: Performed at [facility], [address], [place], [place] [address]   *Note: Due to a large number of results and/or encounters for the requested time period, some results have not been displayed. A complete set of results can be found in Results Review.    Labs: CBC: Recent Labs  Lab 11/03/23 0627 11/04/23 0602  WBC 7.2 7.8  NEUTROABS 4.2  --   HGB 13.2 13.1  HCT 41.9 39.8  MCV 94.6 91.5  PLT 163 158   Basic Metabolic  Panel: Recent Labs  Lab 11/03/23 0627 11/04/23 0602  NA 136 135  K 4.7 4.6  CL 102 103  CO2 23 22  GLUCOSE 152* 114*  BUN 31* 24*  CREATININE 1.46* 1.33*  CALCIUM  8.8* 7.7*   Liver Function Tests: Recent Labs  Lab 11/03/23 0627  AST 22  ALT 17  ALKPHOS 56  BILITOT 0.9  PROT 6.8  ALBUMIN 3.2*   CBG: Recent Labs  Lab 11/04/23 1120 11/04/23 1632 11/04/23 1800 11/04/23 2113 11/05/23 0804  GLUCAP 123* 251* 187* 204* 123*    Discharge time spent: 35 minutes.  Signed: Murvin Mana, MD [facility] 11/05/2023

## 2023-11-05 NOTE — Plan of Care (Signed)
  Problem: Education: Goal: Knowledge of General Education information will improve Description: Including pain rating scale, medication(s)/side effects and non-pharmacologic comfort measures Outcome: Progressing   Problem: Health Behavior/Discharge Planning: Goal: Ability to manage health-related needs will improve Outcome: Progressing   Problem: Clinical Measurements: Goal: Ability to maintain clinical measurements within normal limits will improve Outcome: Progressing Goal: Will remain free from infection Outcome: Progressing Goal: Diagnostic test results will improve Outcome: Progressing Goal: Respiratory complications will improve Outcome: Progressing Goal: Cardiovascular complication will be avoided Outcome: Progressing   Problem: Activity: Goal: Risk for activity intolerance will decrease Outcome: Progressing   Problem: Nutrition: Goal: Adequate nutrition will be maintained Outcome: Progressing   Problem: Coping: Goal: Level of anxiety will decrease Outcome: Progressing   Problem: Elimination: Goal: Will not experience complications related to bowel motility Outcome: Progressing Goal: Will not experience complications related to urinary retention Outcome: Progressing   Problem: Pain Managment: Goal: General experience of comfort will improve and/or be controlled Outcome: Progressing   Problem: Safety: Goal: Ability to remain free from injury will improve Outcome: Progressing   Problem: Skin Integrity: Goal: Risk for impaired skin integrity will decrease Outcome: Progressing   Problem: Coping: Goal: Ability to adjust to condition or change in health will improve Outcome: Progressing   Problem: Education: Goal: Ability to describe self-care measures that may prevent or decrease complications (Diabetes Survival Skills Education) will improve Outcome: Progressing Goal: Individualized Educational Video(s) Outcome: Progressing   Problem: Fluid  Volume: Goal: Ability to maintain a balanced intake and output will improve Outcome: Progressing   Problem: Health Behavior/Discharge Planning: Goal: Ability to identify and utilize available resources and services will improve Outcome: Progressing Goal: Ability to manage health-related needs will improve Outcome: Progressing   Problem: Nutritional: Goal: Maintenance of adequate nutrition will improve Outcome: Progressing Goal: Progress toward achieving an optimal weight will improve Outcome: Progressing   Problem: Skin Integrity: Goal: Risk for impaired skin integrity will decrease Outcome: Progressing   Problem: Tissue Perfusion: Goal: Adequacy of tissue perfusion will improve Outcome: Progressing

## 2023-11-06 NOTE — Telephone Encounter (Signed)
 Routing to provider to advise. Not on current medication list. Is the patient supposed to be on this?

## 2023-11-06 NOTE — Telephone Encounter (Signed)
 Requested medication (s) are due for refill today:   No  Requested medication (s) are on the active medication list:   No    Future visit scheduled:   Yes 8/29   Last ordered: This is not on her medication list.  Unable to locate where she is on this.      Requested Prescriptions  Pending Prescriptions Disp Refills   glipiZIDE  (GLUCOTROL ) 5 MG tablet [Pharmacy Med Name: GLIPIZIDE  5 MG TAB] 90 tablet     Sig: TAKE 1 TABLET BY MOUTH ONCE DAILY BEFOREBREAKFAST     Endocrinology:  Diabetes - Sulfonylureas Failed - 11/06/2023  2:59 PM      Failed - Cr in normal range and within 360 days    Creatinine  Date Value Ref Range Status  04/30/2023 1.77 (H) 0.44 - 1.00 mg/dL Final  92/89/7986 8.68 (H) 0.60 - 1.30 mg/dL Final   Creatinine, Ser  Date Value Ref Range Status  11/04/2023 1.33 (H) 0.44 - 1.00 mg/dL Final         Passed - HBA1C is between 0 and 7.9 and within 180 days    Hemoglobin A1C  Date Value Ref Range Status  11/23/2021 7.5  Final   HB A1C (BAYER DCA - WAIVED)  Date Value Ref Range Status  10/23/2023 5.8 (H) 4.8 - 5.6 % Final    Comment:             Prediabetes: 5.7 - 6.4          Diabetes: >6.4          Glycemic control for adults with diabetes: <7.0    Hgb A1c MFr Bld  Date Value Ref Range Status  11/03/2023 5.9 (H) 4.8 - 5.6 % Final    Comment:    (NOTE) Diagnosis of Diabetes The following HbA1c ranges recommended by the American Diabetes Association (ADA) may be used as an aid in the diagnosis of diabetes mellitus.  Hemoglobin             Suggested A1C NGSP%              Diagnosis  <5.7                   Non Diabetic  5.7-6.4                Pre-Diabetic  >6.4                   Diabetic  <7.0                   Glycemic control for                       adults with diabetes.           Passed - Valid encounter within last 6 months    Recent Outpatient Visits           2 weeks ago Type 2 diabetes mellitus with diabetic peripheral angiopathy  without gangrene, without long-term current use of insulin  [facility])   [place] [facility] [place], Megan P, DO   2 months ago Essential hypertension   [place] [facility] [place], [place] P, DO   2 months ago Chronic bilateral low back pain without sciatica   [place] [facility] [place], Megan P, DO   2 months ago Acute cystitis with hematuria   [place] [facility] [place], [place],  DO   3 months ago Type 2 diabetes mellitus with diabetic peripheral angiopathy without gangrene, without long-term current use of insulin  [facility])   [place] [facility] [place], Duwaine SQUIBB, DO       Future Appointments             In 1 month Wittenborn, Barnie, NP [facility]

## 2023-11-07 ENCOUNTER — Other Ambulatory Visit: Payer: Self-pay | Admitting: Family Medicine

## 2023-11-07 DIAGNOSIS — J4489 Other specified chronic obstructive pulmonary disease: Secondary | ICD-10-CM | POA: Diagnosis not present

## 2023-11-07 DIAGNOSIS — Z7951 Long term (current) use of inhaled steroids: Secondary | ICD-10-CM | POA: Diagnosis not present

## 2023-11-07 DIAGNOSIS — I5032 Chronic diastolic (congestive) heart failure: Secondary | ICD-10-CM | POA: Diagnosis not present

## 2023-11-07 DIAGNOSIS — J4520 Mild intermittent asthma, uncomplicated: Secondary | ICD-10-CM | POA: Diagnosis not present

## 2023-11-07 DIAGNOSIS — Z79899 Other long term (current) drug therapy: Secondary | ICD-10-CM | POA: Diagnosis not present

## 2023-11-07 DIAGNOSIS — N390 Urinary tract infection, site not specified: Secondary | ICD-10-CM | POA: Diagnosis not present

## 2023-11-07 DIAGNOSIS — M48061 Spinal stenosis, lumbar region without neurogenic claudication: Secondary | ICD-10-CM | POA: Diagnosis not present

## 2023-11-07 DIAGNOSIS — I130 Hypertensive heart and chronic kidney disease with heart failure and stage 1 through stage 4 chronic kidney disease, or unspecified chronic kidney disease: Secondary | ICD-10-CM | POA: Diagnosis not present

## 2023-11-07 DIAGNOSIS — E785 Hyperlipidemia, unspecified: Secondary | ICD-10-CM | POA: Diagnosis not present

## 2023-11-07 DIAGNOSIS — E1151 Type 2 diabetes mellitus with diabetic peripheral angiopathy without gangrene: Secondary | ICD-10-CM | POA: Diagnosis not present

## 2023-11-07 DIAGNOSIS — I471 Supraventricular tachycardia, unspecified: Secondary | ICD-10-CM | POA: Diagnosis not present

## 2023-11-07 DIAGNOSIS — Z95828 Presence of other vascular implants and grafts: Secondary | ICD-10-CM | POA: Diagnosis not present

## 2023-11-07 DIAGNOSIS — B965 Pseudomonas (aeruginosa) (mallei) (pseudomallei) as the cause of diseases classified elsewhere: Secondary | ICD-10-CM | POA: Diagnosis not present

## 2023-11-07 DIAGNOSIS — Z9181 History of falling: Secondary | ICD-10-CM | POA: Diagnosis not present

## 2023-11-07 DIAGNOSIS — I2510 Atherosclerotic heart disease of native coronary artery without angina pectoris: Secondary | ICD-10-CM | POA: Diagnosis not present

## 2023-11-07 DIAGNOSIS — E1122 Type 2 diabetes mellitus with diabetic chronic kidney disease: Secondary | ICD-10-CM | POA: Diagnosis not present

## 2023-11-07 DIAGNOSIS — Z981 Arthrodesis status: Secondary | ICD-10-CM | POA: Diagnosis not present

## 2023-11-07 DIAGNOSIS — I2699 Other pulmonary embolism without acute cor pulmonale: Secondary | ICD-10-CM | POA: Diagnosis not present

## 2023-11-07 DIAGNOSIS — Z7985 Long-term (current) use of injectable non-insulin antidiabetic drugs: Secondary | ICD-10-CM | POA: Diagnosis not present

## 2023-11-07 DIAGNOSIS — N1832 Chronic kidney disease, stage 3b: Secondary | ICD-10-CM | POA: Diagnosis not present

## 2023-11-07 DIAGNOSIS — R531 Weakness: Secondary | ICD-10-CM | POA: Diagnosis not present

## 2023-11-07 DIAGNOSIS — Z7901 Long term (current) use of anticoagulants: Secondary | ICD-10-CM | POA: Diagnosis not present

## 2023-11-09 ENCOUNTER — Ambulatory Visit (INDEPENDENT_AMBULATORY_CARE_PROVIDER_SITE_OTHER): Admitting: Family Medicine

## 2023-11-09 ENCOUNTER — Ambulatory Visit: Payer: Self-pay | Admitting: Family Medicine

## 2023-11-09 ENCOUNTER — Encounter: Payer: Self-pay | Admitting: Family Medicine

## 2023-11-09 VITALS — BP 98/64 | HR 70 | Temp 97.6°F | Ht 66.0 in

## 2023-11-09 DIAGNOSIS — N3001 Acute cystitis with hematuria: Secondary | ICD-10-CM

## 2023-11-09 DIAGNOSIS — E039 Hypothyroidism, unspecified: Secondary | ICD-10-CM | POA: Diagnosis not present

## 2023-11-09 DIAGNOSIS — Z87440 Personal history of urinary (tract) infections: Secondary | ICD-10-CM | POA: Diagnosis not present

## 2023-11-09 DIAGNOSIS — I959 Hypotension, unspecified: Secondary | ICD-10-CM | POA: Diagnosis not present

## 2023-11-09 LAB — CULTURE, BLOOD (ROUTINE X 2)
Culture: NO GROWTH
Culture: NO GROWTH

## 2023-11-09 LAB — URINALYSIS, ROUTINE W REFLEX MICROSCOPIC
Bilirubin, UA: NEGATIVE
Glucose, UA: NEGATIVE
Nitrite, UA: NEGATIVE
Specific Gravity, UA: >=1.03 — AB (ref 1.005–1.030)
Urobilinogen, Ur: 0.2 mg/dL (ref 0.2–1.0)
pH, UA: 5.5 (ref 5.0–7.5)

## 2023-11-09 LAB — MICROSCOPIC EXAMINATION: WBC, UA: >30 /HPF — ABNORMAL HIGH (ref 0–5)

## 2023-11-09 MED ORDER — METOPROLOL TARTRATE 25 MG PO TABS: 12.5000 mg | ORAL_TABLET | Freq: Two times a day (BID) | ORAL | Status: AC

## 2023-11-09 MED ORDER — LEVOTHYROXINE SODIUM 137 MCG PO TABS: 137.0000 ug | ORAL_TABLET | Freq: Every day | ORAL | 0 refills | Status: DC

## 2023-11-09 MED ORDER — CIPROFLOXACIN HCL 500 MG PO TABS: 500.0000 mg | ORAL_TABLET | Freq: Two times a day (BID) | ORAL | 0 refills | Status: AC

## 2023-11-09 NOTE — Telephone Encounter (Signed)
 Requested Prescriptions  Pending Prescriptions Disp Refills   DULoxetine  (CYMBALTA ) 60 MG capsule [Pharmacy Med Name: DULOXETINE  HCL 60 MG CAP] 180 capsule 1    Sig: TAKE 2 CAPSULES BY MOUTH ONCE DAILY     Psychiatry: Antidepressants - SNRI - duloxetine  Failed - 11/09/2023 10:10 AM      Failed - Cr in normal range and within 360 days    Creatinine  Date Value Ref Range Status  04/30/2023 1.77 (H) 0.44 - 1.00 mg/dL Final  92/89/7986 8.68 (H) 0.60 - 1.30 mg/dL Final   Creatinine, Ser  Date Value Ref Range Status  11/04/2023 1.33 (H) 0.44 - 1.00 mg/dL Final         Failed - Last BP in normal range    BP Readings from Last 1 Encounters:  11/05/23 (!) 111/48         Passed - eGFR is 30 or above and within 360 days    EGFR (African American)  Date Value Ref Range Status  09/20/2011 48 (L)  Final   GFR calc Af Amer  Date Value Ref Range Status  02/10/2020 32 (L) >59 mL/min/1.73 Final    Comment:    **In accordance with recommendations from the NKF-ASN Task force,**   [facility] is in the process of updating its eGFR calculation to the   2021 CKD-EPI creatinine equation that estimates kidney function   without a race variable.    EGFR (Non-African Amer.)  Date Value Ref Range Status  09/20/2011 42 (L)  Final    Comment:    eGFR values <11mL/min/1.73 m2 may be an indication of chronic kidney disease (CKD). Calculated eGFR is useful in patients with stable renal function. The eGFR calculation will not be reliable in acutely ill patients when serum creatinine is changing rapidly. It is not useful in  patients on dialysis. The eGFR calculation may not be applicable to patients at the low and high extremes of body sizes, pregnant women, and vegetarians.    GFR, Estimated  Date Value Ref Range Status  11/04/2023 40 (L) >60 mL/min Final    Comment:    (NOTE) Calculated using the CKD-EPI Creatinine Equation (2021)   04/30/2023 29 (L) >60 mL/min Final    Comment:     (NOTE) Calculated using the CKD-EPI Creatinine Equation (2021)    eGFR  Date Value Ref Range Status  10/23/2023 40 (L) >59 mL/min/1.73 Final         Passed - Completed PHQ-2 or PHQ-9 in the last 360 days      Passed - Valid encounter within last 6 months    Recent Outpatient Visits           2 weeks ago Type 2 diabetes mellitus with diabetic peripheral angiopathy without gangrene, without long-term current use of insulin  [facility])   [place] [facility] [place], Megan P, DO   2 months ago Essential hypertension   [place] [facility] [place], [place] P, DO   2 months ago Chronic bilateral low back pain without sciatica   [place] [facility] [place], Megan P, DO   3 months ago Acute cystitis with hematuria   [place] [facility] [place], Megan P, DO   4 months ago Type 2 diabetes mellitus with diabetic peripheral angiopathy without gangrene, without long-term current use of insulin  [facility])   [place] [facility] [place] Duwaine SQUIBB, DO       Future Appointments  In 1 month Wittenborn, Barnie, NP [employer] [facility]

## 2023-11-09 NOTE — Progress Notes (Signed)
 BP 98/64   Pulse 70   Temp 97.6 F (36.4 C) (Oral)   Ht 5' 6 (1.676 m)   SpO2 93%   BMI 25.50 kg/m    Subjective:    Patient ID: Tammy [name], female    DOB: [birth year], 78 y.o.   MRN: [id number]  HPI: Tammy [name] is a 78 y.o. female  Chief Complaint  Patient presents with   Hospitalization Follow-up   Fall    A week ago. Clemens out of chair    Urinary Tract Infection   Transition of Care Hospital Follow up.   Hospital/Facility: [facility] Physician: Dr. Laurita D/C Date: 11/05/23  Records Requested: 11/09/23 Records Received: 11/09/23 Records Reviewed: 11/09/23  Diagnoses on Discharge:   UTI (urinary tract infection)   COPD (chronic obstructive pulmonary disease) (HCC)   CKD stage 3b, GFR 30-44 ml/min (HCC)   Impaired ambulation Chronic diastolic congestive heart failure History of PSVT. Essential hypertension. Peripheral arterial disease status post intervention. Nonobstructive coronary disease. Chronic back pain.  Date of interactive Contact within 48 hours of discharge: 11/05/23 Contact was through: direct  Date of 7 day or 14 day face-to-face visit:  11/09/23  within 7 days  Outpatient Encounter Medications as of 11/09/2023  Medication Sig   acetaminophen  (TYLENOL ) 500 MG tablet Take 500 mg by mouth every 6 (six) hours as needed for moderate pain. Takes up to 3 tablets/day PRN for increased pain   albuterol  (VENTOLIN  HFA) 108 (90 Base) MCG/ACT inhaler Inhale 2 puffs into the lungs every 6 (six) hours as needed for wheezing or shortness of breath.   anastrozole  (ARIMIDEX ) 1 MG tablet TAKE 1 TABLET BY MOUTH ONCE DAILY   apixaban  (ELIQUIS ) 2.5 MG TABS tablet Take 1 tablet (2.5 mg total) by mouth 2 (two) times daily.   Calcium  Carb-Cholecalciferol  (CALCIUM  600 + D) 600-200 MG-UNIT TABS Take 1 tablet by mouth daily.    cetirizine  (ZYRTEC ) 10 MG tablet TAKE 1 TABLET BY MOUTH DAILY   cholecalciferol  (VITAMIN D3) 25 MCG (1000 UT) tablet Take 1,000 Units by mouth daily.    Continuous Glucose Receiver (FREESTYLE LIBRE 3 READER) DEVI by Does not apply route.   Continuous Glucose Sensor (FREESTYLE LIBRE 3 PLUS SENSOR) MISC 1 each by Does not apply route. Change sensor every 15 days.   CRANBERRY PO Take 2 capsules by mouth daily.   DULoxetine  (CYMBALTA ) 60 MG capsule TAKE 2 CAPSULES BY MOUTH ONCE DAILY   fluticasone  (FLONASE ) 50 MCG/ACT nasal spray Place 1 spray into both nostrils daily.   Fluticasone -Umeclidin-Vilant (TRELEGY ELLIPTA ) 100-62.5-25 MCG/ACT AEPB INHALE 1 PUFF INTO THE LUNGS DAILY   gabapentin  (NEURONTIN ) 400 MG capsule Take 2 capsules (800 mg total) by mouth 3 (three) times daily.   MYRBETRIQ  50 MG TB24 tablet TAKE 1 TABLET BY MOUTH DAILY   nitroGLYCERIN  (NITROSTAT ) 0.4 MG SL tablet Place 1 tablet (0.4 mg total) under the tongue every 5 (five) minutes as needed for chest pain. Total of 3 doses   ondansetron  (ZOFRAN ) 4 MG tablet Take 1 tablet (4 mg total) by mouth every 8 (eight) hours as needed for nausea or vomiting.   pantoprazole  (PROTONIX ) 40 MG tablet TAKE 1 TABLET BY MOUTH ONCE DAILY   Pumpkin Seed-Soy Germ (AZO BLADDER CONTROL/GO-LESS PO) Take 1 tablet by mouth in the morning and at bedtime.   rosuvastatin  (CRESTOR ) 40 MG tablet Take 1 tablet (40 mg total) by mouth daily.   tirzepatide  (MOUNJARO ) 7.5 MG/0.5ML Pen Inject 7.5 mg into the skin  once a week.   ZINC  CITRATE PO Take 1 tablet by mouth daily.   [DISCONTINUED] ciprofloxacin  (CIPRO ) 500 MG tablet Take 1 tablet (500 mg total) by mouth 2 (two) times daily for 5 days.   [DISCONTINUED] levothyroxine  (SYNTHROID ) 125 MCG tablet Take 1 tablet (125 mcg total) by mouth daily before breakfast.   [DISCONTINUED] metoprolol  tartrate (LOPRESSOR ) 25 MG tablet Take 1 tablet (25 mg total) by mouth 2 (two) times daily.   [EXPIRED] ciprofloxacin  (CIPRO ) 500 MG tablet Take 1 tablet (500 mg total) by mouth 2 (two) times daily for 7 days.   levothyroxine  (SYNTHROID ) 137 MCG tablet Take 1 tablet (137 mcg total) by  mouth daily before breakfast.   metoprolol  tartrate (LOPRESSOR ) 25 MG tablet Take 0.5 tablets (12.5 mg total) by mouth 2 (two) times daily.   montelukast  (SINGULAIR ) 10 MG tablet TAKE 1 TABLET BY MOUTH AT BEDTIME (Patient not taking: Reported on 11/09/2023)   No facility-administered encounter medications on file as of 11/09/2023.  Per Hospitalist: Weakness and a fall secondary to UTI. Pseudomonas urinary tract infection. Sepsis ruled out Patient does not meet criteria for sepsis. Patient still feels very very weak, PT will see patient today.  Urine culture grew Pseudomonas, antibiotic switched to cefepime .  Urine culture has finalized, pansensitive Pseudomonas.  Will treat with additional 5 days of Cipro .  Condition has improved.   COPD. Stable.   Chronic kidney stage IIIb. Renal function appears to be stable.   Likely type 2 diabetes. Patient has varied glucose, highest was 251, follow-up with PCP for treatment   Diagnostic Tests Reviewed:  CLINICAL DATA:  Blunt polytrauma, questionable sepsis. Fall injury, not feeling well.  EXAM: CT CHEST, ABDOMEN AND PELVIS WITHOUT CONTRAST  TECHNIQUE: Multidetector CT imaging of the chest, abdomen and pelvis was performed following the standard protocol without IV contrast.  ...     CT L-SPINE NO CHARGE Final result 11/03/2023 6:48 AM      CLINICAL DATA:  78 year old female status post fall from chair striking head. Pain. Malaise.  EXAM: CT LUMBAR SPINE WITHOUT CONTRAST  TECHNIQUE: Technique: Multiplanar CT images of the lumbar spine were reconstructed from contemporary CT of the Abdomen and Pelvis.  ...     CT T-SPINE NO CHARGE Final result 11/03/2023 6:48 AM      CLINICAL DATA:  78 year old female status post fall from chair striking head. Pain. Malaise.  EXAM: CT THORACIC SPINE WITHOUT CONTRAST  TECHNIQUE: Multiplanar CT images of the thoracic spine were reconstructed from contemporary CT of the Chest.  ...     CT  Cervical Spine Wo Contrast Final result 11/03/2023 6:48 AM      CLINICAL DATA:  78 year old female status post fall from chair striking head. Pain. Malaise.  EXAM: CT CERVICAL SPINE WITHOUT CONTRAST  TECHNIQUE: Multidetector CT imaging of the cervical spine was performed without intravenous contrast. Multiplanar CT image reconstructions were also generated. ...   Study Result  Narrative & Impression  CLINICAL DATA:  78 year old female status post fall from chair striking head. Pain. Malaise.   EXAM: CT HEAD WITHOUT CONTRAST   TECHNIQUE: Contiguous axial images were obtained from the base of the skull through the vertex without intravenous contrast.   RADIATION DOSE REDUCTION: This exam was performed according to the departmental dose-optimization program which includes automated exposure control, adjustment of the mA and/or kV according to patient size and/or use of iterative reconstruction technique.   COMPARISON:  Brain MRI 10/03/2018.  Head CT 03/08/2022.   FINDINGS: Brain:  Cerebral volume not significantly changed since 2023. Chronic encephalomalacia left inferior frontal gyrus, stable.   No midline shift, ventriculomegaly, mass effect, evidence of mass lesion, intracranial hemorrhage or evidence of cortically based acute infarction.   Patchy other scattered cerebral white matter hypodensity appears stable since 2023.   Vascular: No suspicious intracranial vascular hyperdensity. Calcified atherosclerosis at the skull base.   Skull: Appears stable and intact.   Sinuses/Orbits: Visualized paranasal sinuses and mastoids are stable and well aerated.   Other: Mild left forehead scalp contusion or hematoma on series 3, image 48. No scalp soft tissue gas. Underlying calvarium appears intact. Orbits soft tissues appears stable and negative.   IMPRESSION: 1. Mild left forehead scalp soft tissue injury.  No skull fracture. 2. No acute intracranial abnormality.  Stable since 2023 left inferior frontal gyrus encephalomalacia, scattered chronic white matter disease.   CLINICAL DATA:  78 year old female status post fall from chair striking head. Pain. Malaise.  EXAM: CT LUMBAR SPINE WITHOUT CONTRAST  TECHNIQUE: Technique: Multiplanar CT images of the lumbar spine were reconstructed from contemporary CT of the Abdomen and Pelvis.  ...     CT T-SPINE NO CHARGE Final result 11/03/2023 6:48 AM      CLINICAL DATA:  78 year old female status post fall from chair striking head. Pain. Malaise.  EXAM: CT THORACIC SPINE WITHOUT CONTRAST  TECHNIQUE: Multiplanar CT images of the thoracic spine were reconstructed from contemporary CT of the Chest.  ...     CT Cervical Spine Wo Contrast Final result 11/03/2023 6:48 AM      CLINICAL DATA:  78 year old female status post fall from chair striking head. Pain. Malaise.  EXAM: CT CERVICAL SPINE WITHOUT CONTRAST  TECHNIQUE: Multidetector CT imaging of the cervical spine was performed without intravenous contrast. Multiplanar CT image reconstructions were also generated. ...     CT HEAD WO CONTRAST ( ) Final result 11/03/2023 6:48 AM      CLINICAL DATA:  78 year old female status post fall from chair striking head. Pain. Malaise.  EXAM: CT HEAD WITHOUT CONTRAST  TECHNIQUE: Contiguous axial images were obtained from the base of the skull through the vertex without intravenous contrast.  ...   Study Result  Narrative & Impression  CLINICAL DATA:  Blunt polytrauma, questionable sepsis. Fall injury, not feeling well.   EXAM: CT CHEST, ABDOMEN AND PELVIS WITHOUT CONTRAST   TECHNIQUE: Multidetector CT imaging of the chest, abdomen and pelvis was performed following the standard protocol without IV contrast.   RADIATION DOSE REDUCTION: This exam was performed according to the departmental dose-optimization program which includes automated exposure control, adjustment of the mA and/or  kV according to patient size and/or use of iterative reconstruction technique.   COMPARISON:  Portable chest from today, chest radiograph 03/10/2022, chest CTs without contrast recently 10/16/2023, last year 07/12/2022, and CT abdomen and pelvis without contrast from 04/11/2022.   FINDINGS: CT CHEST FINDINGS   Cardiovascular: There is mild cardiomegaly. Three-vessel coronary calcification heaviest in the LAD.   Diffuse aortic atherosclerosis and moderate patchy calcification in the great vessels.   The aorta is normal caliber until the distal third of the thoracic aorta to the hiatal segment, which is mildly dilated measuring up to 4 cm.   This was seen previously.  The descending segment is tortuous.   Pulmonary arteries and veins are normal caliber. No pericardial effusion.   Mediastinum/Nodes: No enlarged mediastinal, hilar, or axillary lymph nodes. Thyroid  gland, trachea, and esophagus demonstrate no acute significant findings. The esophagus is patulous  but no more than previously.   Lungs/Pleura: The lungs are moderately emphysematous with centrilobular changes predominating, paraseptal changes in the upper apices.   There is scarring in the lung apices, linear scarring left upper lobe. Atelectasis or scarring posteriorly right middle lobe.   Diffuse bronchial thickening. Occasional subsegmental bronchial impactions noted right lower lobe.   There is increased opacity in the posterior right lower lobe which could be atelectasis or pneumonia. Not seen previously.   Few tiny lung nodules described previously are unchanged. No suspicious or new nodule. No pleural effusion.   Rest of the lungs are clear.  There is no pneumothorax.   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   No acute regional skeletal fracture is suspected. Mild degenerative change thoracic spine.   Spinal stimulator wiring enters the dorsal spinal canal  at T10-11 terminating at T8-9.   CT ABDOMEN PELVIS FINDINGS   Hepatobiliary: No focal liver abnormality is seen. Status post cholecystectomy. No biliary dilatation.   Pancreas: Partially atrophic. Otherwise unremarkable without contrast.   Spleen: Unremarkable without contrast.  No splenomegaly.   Adrenals/Urinary Tract: There is no adrenal mass or hemorrhage. No perinephric hemorrhage is seen or acute renal injury without contrast.   Chronic asymmetric atrophy and scarring right kidney with chronic hydronephrosis.   No ureteral dilatation or stones are seen bilaterally. Bladder is unremarkable.   Stomach/Bowel: Chronic 3.7 cm periampullary duodenal diverticulum with fluid level.   Normal caliber unopacified small bowel surgically absent appendix.   Mobile cecum located anterior midline upper abdomen. No wall thickening or dilatation of the colon.   There is advanced diverticulosis in the descending and sigmoid segments. No evidence of acute diverticulitis.   Vascular/Lymphatic: Aortic atherosclerosis. No enlarged abdominal or pelvic lymph nodes.   Reproductive: Uterus and bilateral adnexa are unremarkable.   Other: No abdominal wall hematoma or fluid collection. No incarcerated hernias.   There is no mesenteric contusion. There is no free fluid, free hemorrhage or free air.   Musculoskeletal: L2-S1 dorsal fusion hardware again is noted. No regional skeletal fracture is seen. There is osteopenia. Neurostimulator battery left flank.   IMPRESSION: 1. No acute trauma related findings in the chest, abdomen or pelvis. 2. COPD with bronchitis and occasional subsegmental bronchial impactions in the right lower lobe. 3. Increased opacity in the posterior right lower lobe which could be atelectasis or pneumonia. 4. Aortic and coronary artery atherosclerosis.  Mild cardiomegaly. 5. 4 cm distal thoracic aortic aneurysm, unchanged. 6. Chronic asymmetric atrophy and  scarring right kidney with chronic hydronephrosis. 7. Diverticulosis without evidence of diverticulitis. 8. Osteopenia, postsurgical and degenerative change. 9. 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   Aortic Atherosclerosis (ICD10-I70.0) and Emphysema (ICD10-J43.9).   Disposition: Home  Consults: None  Discharge Instructions: Follow up here  Disease/illness Education: Discussed today  Home Health/Community Services Discussions/Referrals: N/A  Establishment or re-establishment of referral orders for community resources: N/A  Discussion with other health care providers: N/A  Assessment and Support of treatment regimen adherence: Good  Appointments Coordinated with: Patient  Education for self-management, independent living, and ADLs: Discussed today  Has been feeling tired since getting home. Has home health starting next qeek. Having trouble getting out of a chair. Her legs are really weak. Has been hacing a lot of pain. Everyting is just hurting.   HYPOTHYROIDISM Thyroid  control status:uncontrolled Satisfied with current treatment? yes Medication side effects: no Recent dose adjustment:no Fatigue: yes Cold intolerance: no Heat  intolerance: no Weight gain: no Weight loss: yes Constipation: no Diarrhea/loose stools: no Palpitations: no Lower extremity edema: no Anxiety/depressed mood: no   Relevant past medical, surgical, family and social history reviewed and updated as indicated. Interim medical history since our last visit reviewed. Allergies and medications reviewed and updated.  Review of Systems  Constitutional:  Positive for fatigue. Negative for activity change, appetite change, chills, diaphoresis, fever and unexpected weight change.  Respiratory: Negative.    Cardiovascular: Negative.   Gastrointestinal: Negative.   Genitourinary: Negative.   Musculoskeletal:  Positive for back pain and myalgias. Negative for arthralgias, gait  problem, joint swelling, neck pain and neck stiffness.  Neurological:  Positive for weakness. Negative for dizziness, tremors, seizures, syncope, facial asymmetry, speech difficulty, light-headedness, numbness and headaches.  Psychiatric/Behavioral: Negative.      Per HPI unless specifically indicated above     Objective:    BP 98/64   Pulse 70   Temp 97.6 F (36.4 C) (Oral)   Ht 5' 6 (1.676 m)   SpO2 93%   BMI 25.50 kg/m   Wt Readings from Last 3 Encounters:  11/03/23 158 lb (71.7 kg)  09/11/23 176 lb 9.6 oz (80.1 kg)  09/10/23 176 lb 6 oz (80 kg)    Physical Exam Vitals and nursing note reviewed.  Constitutional:      General: She is not in acute distress.    Appearance: Normal appearance. She is not ill-appearing, toxic-appearing or diaphoretic.  HENT:     Head: Normocephalic and atraumatic.     Right Ear: External ear normal.     Left Ear: External ear normal.     Nose: Nose normal.     Mouth/Throat:     Mouth: Mucous membranes are moist.     Pharynx: Oropharynx is clear.  Eyes:     General: No scleral icterus.       Right eye: No discharge.        Left eye: No discharge.     Extraocular Movements: Extraocular movements intact.     Conjunctiva/sclera: Conjunctivae normal.     Pupils: Pupils are equal, round, and reactive to light.  Cardiovascular:     Rate and Rhythm: Normal rate and regular rhythm.     Pulses: Normal pulses.     Heart sounds: Normal heart sounds. No murmur heard.    No friction rub. No gallop.  Pulmonary:     Effort: Pulmonary effort is normal. No respiratory distress.     Breath sounds: Normal breath sounds. No stridor. No wheezing, rhonchi or rales.  Chest:     Chest wall: No tenderness.  Musculoskeletal:        General: Normal range of motion.     Cervical back: Normal range of motion and neck supple.  Skin:    General: Skin is warm and dry.     Capillary Refill: Capillary refill takes less than 2 seconds.     Coloration: Skin is  not jaundiced or pale.     Findings: No bruising, erythema, lesion or rash.  Neurological:     General: No focal deficit present.     Mental Status: She is alert and oriented to person, place, and time. Mental status is at baseline.  Psychiatric:        Mood and Affect: Mood normal.        Behavior: Behavior normal.        Thought Content: Thought content normal.        Judgment:  Judgment normal.     Results for orders placed or performed in visit on 11/09/23  Microscopic Examination   Collection Time: 11/09/23 11:57 AM   Urine  Result Value Ref Range   WBC, UA >30 (H) 0 - 5 /hpf   RBC, Urine 3-10 (A) 0 - 2 /hpf   Epithelial Cells (non renal) 0-10 0 - 10 /hpf   Bacteria, UA Many (A) None seen/Few  Urinalysis, Routine w reflex microscopic   Collection Time: 11/09/23 11:57 AM  Result Value Ref Range   Specific Gravity, UA      >=1.030 (A) 1.005 - 1.030   pH, UA 5.5 5.0 - 7.5   Color, UA Yellow Yellow   Appearance Ur Cloudy (A) Clear   Leukocytes,UA 3+ (A) Negative   Protein,UA 2+ (A) Negative/Trace   Glucose, UA Negative Negative   Ketones, UA Trace (A) Negative   RBC, UA 2+ (A) Negative   Bilirubin, UA Negative Negative   Urobilinogen, Ur 0.2 0.2 - 1.0 mg/dL   Nitrite, UA Negative Negative   Microscopic Examination See below:   Urine Culture   Collection Time: 11/09/23  2:15 PM   Specimen: Urine   UR  Result Value Ref Range   Urine Culture, Routine Final report    Organism ID, Bacteria Comment    *Note: Due to a large number of results and/or encounters for the requested time period, some results have not been displayed. A complete set of results can be found in Results Review.      Assessment & Plan:   Problem List Items Addressed This Visit       Endocrine   Hypothyroid   TSH still elevated at 7.7. Will increase her levothyroxine  to and recheck in 6 weeks.       Relevant Medications   metoprolol  tartrate (LOPRESSOR ) 25 MG tablet   levothyroxine   (SYNTHROID ) 137 MCG tablet     Genitourinary   UTI (urinary tract infection) - Primary   Relevant Orders   Urine Culture (Completed)   Other Visit Diagnoses       History of UTI       Rechecking UA today. Await results. Treat as needed.   Relevant Orders   Urinalysis, Routine w reflex microscopic (Completed)     Hypotension, unspecified hypotension type       BP running low. Will cut her metoprolol  in 1/2. Recheck 2 weeks.   Relevant Medications   metoprolol  tartrate (LOPRESSOR ) 25 MG tablet        Follow up plan: Return in about 2 weeks (around 11/23/2023).   >25 minutes spent with patient and her husband today

## 2023-11-09 NOTE — Assessment & Plan Note (Signed)
 TSH still elevated at 7.7. Will increase her levothyroxine  to and recheck in 6 weeks.

## 2023-11-11 LAB — URINE CULTURE

## 2023-11-13 ENCOUNTER — Other Ambulatory Visit: Payer: Self-pay | Admitting: Family Medicine

## 2023-11-13 DIAGNOSIS — R531 Weakness: Secondary | ICD-10-CM | POA: Diagnosis not present

## 2023-11-13 DIAGNOSIS — N390 Urinary tract infection, site not specified: Secondary | ICD-10-CM | POA: Diagnosis not present

## 2023-11-13 DIAGNOSIS — I5032 Chronic diastolic (congestive) heart failure: Secondary | ICD-10-CM | POA: Diagnosis not present

## 2023-11-13 DIAGNOSIS — B965 Pseudomonas (aeruginosa) (mallei) (pseudomallei) as the cause of diseases classified elsewhere: Secondary | ICD-10-CM | POA: Diagnosis not present

## 2023-11-13 DIAGNOSIS — E1122 Type 2 diabetes mellitus with diabetic chronic kidney disease: Secondary | ICD-10-CM | POA: Diagnosis not present

## 2023-11-13 DIAGNOSIS — I130 Hypertensive heart and chronic kidney disease with heart failure and stage 1 through stage 4 chronic kidney disease, or unspecified chronic kidney disease: Secondary | ICD-10-CM | POA: Diagnosis not present

## 2023-11-14 NOTE — Telephone Encounter (Signed)
 Requested medication (s) are due for refill today: na   Requested medication (s) are on the active medication list: yes   Last refill:  08/27/23- 11/25/23 #540 0 refills  Future visit scheduled: yes 11/26/23  Notes to clinic:  do you want to refill Rx prior to next appt?     Requested Prescriptions  Pending Prescriptions Disp Refills   gabapentin  (NEURONTIN ) 400 MG capsule [Pharmacy Med Name: GABAPENTIN  400 MG CAP] 540 capsule 0    Sig: TAKE 2 CAPSULES BY MOUTH 3 TIMES DAILY     Neurology: Anticonvulsants - gabapentin  Failed - 11/14/2023 12:08 PM      Failed - Cr in normal range and within 360 days    Creatinine  Date Value Ref Range Status  04/30/2023 1.77 (H) 0.44 - 1.00 mg/dL Final  92/89/7986 8.68 (H) 0.60 - 1.30 mg/dL Final   Creatinine, Ser  Date Value Ref Range Status  11/04/2023 1.33 (H) 0.44 - 1.00 mg/dL Final         Passed - Completed PHQ-2 or PHQ-9 in the last 360 days      Passed - Valid encounter within last 12 months    Recent Outpatient Visits           5 days ago History of UTI   [facility] [place], Megan P, DO   3 weeks ago Type 2 diabetes mellitus with diabetic peripheral angiopathy without gangrene, without long-term current use of insulin  [facility])   [facility] [place], Megan P, DO   2 months ago Essential hypertension   [facility] [place], Megan P, DO   2 months ago Chronic bilateral low back pain without sciatica   [facility] [place], Megan P, DO   3 months ago Acute cystitis with hematuria   [facility] Vicci Duwaine SQUIBB, DO       Future Appointments             In 1 month Wittenborn, Barnie, NP [facility]

## 2023-11-16 ENCOUNTER — Telehealth: Payer: Self-pay

## 2023-11-16 NOTE — Telephone Encounter (Signed)
 noted

## 2023-11-16 NOTE — Telephone Encounter (Signed)
 Just an FYI to provider.   Copied from CRM [id number]. Topic: Clinical - Home Health Verbal Orders >> Nov 16, 2023 10:14 AM Delon HERO wrote: Lacie calling from [facility] is calling to report a missed visit with the patient. As the patient went out of town. CB- [phone]

## 2023-11-18 ENCOUNTER — Encounter: Payer: Self-pay | Admitting: Family Medicine

## 2023-11-19 DIAGNOSIS — I130 Hypertensive heart and chronic kidney disease with heart failure and stage 1 through stage 4 chronic kidney disease, or unspecified chronic kidney disease: Secondary | ICD-10-CM | POA: Diagnosis not present

## 2023-11-19 DIAGNOSIS — R531 Weakness: Secondary | ICD-10-CM | POA: Diagnosis not present

## 2023-11-19 DIAGNOSIS — N390 Urinary tract infection, site not specified: Secondary | ICD-10-CM | POA: Diagnosis not present

## 2023-11-19 DIAGNOSIS — I5032 Chronic diastolic (congestive) heart failure: Secondary | ICD-10-CM | POA: Diagnosis not present

## 2023-11-19 DIAGNOSIS — B965 Pseudomonas (aeruginosa) (mallei) (pseudomallei) as the cause of diseases classified elsewhere: Secondary | ICD-10-CM | POA: Diagnosis not present

## 2023-11-19 DIAGNOSIS — E1122 Type 2 diabetes mellitus with diabetic chronic kidney disease: Secondary | ICD-10-CM | POA: Diagnosis not present

## 2023-11-26 ENCOUNTER — Ambulatory Visit (INDEPENDENT_AMBULATORY_CARE_PROVIDER_SITE_OTHER): Admitting: Family Medicine

## 2023-11-26 ENCOUNTER — Encounter: Payer: Self-pay | Admitting: Family Medicine

## 2023-11-26 VITALS — BP 124/70 | HR 66 | Ht 66.0 in | Wt 179.8 lb

## 2023-11-26 DIAGNOSIS — Z23 Encounter for immunization: Secondary | ICD-10-CM | POA: Diagnosis not present

## 2023-11-26 DIAGNOSIS — I10 Essential (primary) hypertension: Secondary | ICD-10-CM

## 2023-11-26 MED ORDER — GABAPENTIN 400 MG PO CAPS: 800.0000 mg | ORAL_CAPSULE | Freq: Three times a day (TID) | ORAL | 1 refills | Status: AC

## 2023-11-26 MED ORDER — ROSUVASTATIN CALCIUM 40 MG PO TABS: 40.0000 mg | ORAL_TABLET | Freq: Every day | ORAL | 1 refills | Status: AC

## 2023-11-26 NOTE — Progress Notes (Unsigned)
 BP 124/70 (BP Location: Left Arm, Patient Position: Sitting, Cuff Size: Large)   Pulse 66   Ht 5' 6 (1.676 m)   Wt 179 lb 12.8 oz (81.6 kg)   SpO2 97%   BMI 29.02 kg/m    Subjective:    Patient ID: Tammy [name], female    DOB: 04-20-1945, 78 y.o.   MRN: [id number]  HPI: Tammy [name] is a 78 y.o. female  Chief Complaint  Patient presents with   Hypotension   Medication Refill    Gabapentin , glipizide     HYPERTENSION  Hypertension status: better  Satisfied with current treatment? yes Duration of hypertension: chronic BP monitoring frequency:  a few times a month BP medication side effects:  no Medication compliance: excellent compliance Previous BP meds:metoprolol  Aspirin : no Recurrent headaches: no Visual changes: no Palpitations: no Dyspnea: no Chest pain: no Lower extremity edema: no Dizzy/lightheaded: no  Relevant past medical, surgical, family and social history reviewed and updated as indicated. Interim medical history since our last visit reviewed. Allergies and medications reviewed and updated.  Review of Systems  Constitutional: Negative.   Respiratory: Negative.    Cardiovascular: Negative.   Musculoskeletal: Negative.   Neurological: Negative.   Psychiatric/Behavioral: Negative.      Per HPI unless specifically indicated above     Objective:    BP 124/70 (BP Location: Left Arm, Patient Position: Sitting, Cuff Size: Large)   Pulse 66   Ht 5' 6 (1.676 m)   Wt 179 lb 12.8 oz (81.6 kg)   SpO2 97%   BMI 29.02 kg/m   Wt Readings from Last 3 Encounters:  11/26/23 179 lb 12.8 oz (81.6 kg)  11/03/23 158 lb (71.7 kg)  09/11/23 176 lb 9.6 oz (80.1 kg)    Physical Exam Vitals and nursing note reviewed.  Constitutional:      General: She is not in acute distress.    Appearance: Normal appearance. She is not ill-appearing, toxic-appearing or diaphoretic.  HENT:     Head: Normocephalic and atraumatic.     Right Ear: External ear normal.     Left  Ear: External ear normal.     Nose: Nose normal.     Mouth/Throat:     Mouth: Mucous membranes are moist.     Pharynx: Oropharynx is clear.  Eyes:     General: No scleral icterus.       Right eye: No discharge.        Left eye: No discharge.     Extraocular Movements: Extraocular movements intact.     Conjunctiva/sclera: Conjunctivae normal.     Pupils: Pupils are equal, round, and reactive to light.  Cardiovascular:     Rate and Rhythm: Normal rate and regular rhythm.     Pulses: Normal pulses.     Heart sounds: Normal heart sounds. No murmur heard.    No friction rub. No gallop.  Pulmonary:     Effort: Pulmonary effort is normal. No respiratory distress.     Breath sounds: Normal breath sounds. No stridor. No wheezing, rhonchi or rales.  Chest:     Chest wall: No tenderness.  Musculoskeletal:        General: Normal range of motion.     Cervical back: Normal range of motion and neck supple.  Skin:    General: Skin is warm and dry.     Capillary Refill: Capillary refill takes less than 2 seconds.     Coloration: Skin is not jaundiced or pale.  Findings: No bruising, erythema, lesion or rash.  Neurological:     General: No focal deficit present.     Mental Status: She is alert and oriented to person, place, and time. Mental status is at baseline.  Psychiatric:        Mood and Affect: Mood normal.        Behavior: Behavior normal.        Thought Content: Thought content normal.        Judgment: Judgment normal.     Results for orders placed or performed in visit on 11/09/23  Microscopic Examination   Collection Time: 11/09/23 11:57 AM   Urine  Result Value Ref Range   WBC, UA >30 (H) 0 - 5 /hpf   RBC, Urine 3-10 (A) 0 - 2 /hpf   Epithelial Cells (non renal) 0-10 0 - 10 /hpf   Bacteria, UA Many (A) None seen/Few  Urinalysis, Routine w reflex microscopic   Collection Time: 11/09/23 11:57 AM  Result Value Ref Range   Specific Gravity, UA      >=1.030 (A) 1.005 -  1.030   pH, UA 5.5 5.0 - 7.5   Color, UA Yellow Yellow   Appearance Ur Cloudy (A) Clear   Leukocytes,UA 3+ (A) Negative   Protein,UA 2+ (A) Negative/Trace   Glucose, UA Negative Negative   Ketones, UA Trace (A) Negative   RBC, UA 2+ (A) Negative   Bilirubin, UA Negative Negative   Urobilinogen, Ur 0.2 0.2 - 1.0 mg/dL   Nitrite, UA Negative Negative   Microscopic Examination See below:   Urine Culture   Collection Time: 11/09/23  2:15 PM   Specimen: Urine   UR  Result Value Ref Range   Urine Culture, Routine Final report    Organism ID, Bacteria Comment    *Note: Due to a large number of results and/or encounters for the requested time period, some results have not been displayed. A complete set of results can be found in Results Review.      Assessment & Plan:   Problem List Items Addressed This Visit       Cardiovascular and Mediastinum   Essential hypertension - Primary (Chronic)   Relevant Medications   rosuvastatin  (CRESTOR ) 40 MG tablet   Other Visit Diagnoses       Needs flu shot       Flu shot given today.   Relevant Orders   Flu vaccine HIGH DOSE PF(Fluzone  Trivalent)        Follow up plan: Return in about 10 weeks (around 02/04/2024).

## 2023-11-28 DIAGNOSIS — E1122 Type 2 diabetes mellitus with diabetic chronic kidney disease: Secondary | ICD-10-CM | POA: Diagnosis not present

## 2023-11-28 DIAGNOSIS — N390 Urinary tract infection, site not specified: Secondary | ICD-10-CM | POA: Diagnosis not present

## 2023-11-28 DIAGNOSIS — I5032 Chronic diastolic (congestive) heart failure: Secondary | ICD-10-CM | POA: Diagnosis not present

## 2023-11-28 DIAGNOSIS — B965 Pseudomonas (aeruginosa) (mallei) (pseudomallei) as the cause of diseases classified elsewhere: Secondary | ICD-10-CM | POA: Diagnosis not present

## 2023-11-28 DIAGNOSIS — I130 Hypertensive heart and chronic kidney disease with heart failure and stage 1 through stage 4 chronic kidney disease, or unspecified chronic kidney disease: Secondary | ICD-10-CM | POA: Diagnosis not present

## 2023-11-28 DIAGNOSIS — R531 Weakness: Secondary | ICD-10-CM | POA: Diagnosis not present

## 2023-12-03 ENCOUNTER — Inpatient Hospital Stay: Attending: Family Medicine

## 2023-12-03 ENCOUNTER — Inpatient Hospital Stay: Admitting: Internal Medicine

## 2023-12-04 ENCOUNTER — Other Ambulatory Visit: Payer: Self-pay | Admitting: Family Medicine

## 2023-12-05 ENCOUNTER — Other Ambulatory Visit: Payer: Self-pay | Admitting: Family Medicine

## 2023-12-05 ENCOUNTER — Other Ambulatory Visit: Payer: Self-pay

## 2023-12-05 NOTE — Telephone Encounter (Signed)
 Requested medication (s) are due for refill today: yes  Requested medication (s) are on the active medication list: yes  Last refill:  04/15/23 #180 1 RF  Future visit scheduled: yes  Notes to clinic:  Cr outside range   Requested Prescriptions  Pending Prescriptions Disp Refills   ELIQUIS  2.5 MG TABS tablet [Pharmacy Med Name: ELIQUIS  2.5 MG TAB] 180 tablet 1    Sig: TAKE ONE (1) TABLET BY MOUTH TWO TIMES PER DAY     Hematology:  Anticoagulants - apixaban  Failed - 12/05/2023  3:19 PM      Failed - Cr in normal range and within 360 days    Creatinine  Date Value Ref Range Status  04/30/2023 1.77 (H) 0.44 - 1.00 mg/dL Final  92/89/7986 8.68 (H) 0.60 - 1.30 mg/dL Final   Creatinine, Ser  Date Value Ref Range Status  11/04/2023 1.33 (H) 0.44 - 1.00 mg/dL Final         Passed - PLT in normal range and within 360 days    Platelets  Date Value Ref Range Status  11/04/2023 158 150 - 400 K/uL Final  10/23/2023 224 150 - 450 x10E3/uL Final         Passed - HGB in normal range and within 360 days    Hemoglobin  Date Value Ref Range Status  11/04/2023 13.1 12.0 - 15.0 g/dL Final  91/87/7974 87.0 11.1 - 15.9 g/dL Final         Passed - HCT in normal range and within 360 days    HCT  Date Value Ref Range Status  11/04/2023 39.8 36.0 - 46.0 % Final   Hematocrit  Date Value Ref Range Status  10/23/2023 39.8 34.0 - 46.6 % Final         Passed - AST in normal range and within 360 days    AST  Date Value Ref Range Status  11/03/2023 22 15 - 41 U/L Final         Passed - ALT in normal range and within 360 days    ALT  Date Value Ref Range Status  11/03/2023 17 0 - 44 U/L Final         Passed - Valid encounter within last 12 months    Recent Outpatient Visits           1 week ago Essential hypertension   [facility] [place], Megan P, DO   3 weeks ago Acute cystitis with hematuria   [facility] [place], Megan P, DO   1  month ago Type 2 diabetes mellitus with diabetic peripheral angiopathy without gangrene, without long-term current use of insulin  [facility])   [facility] [place], Megan P, DO   3 months ago Essential hypertension   [facility] [place], Megan P, DO   3 months ago Chronic bilateral low back pain without sciatica   [facility] Vicci Duwaine SQUIBB, DO       Future Appointments             In 2 weeks Loistine Sober, NP [facility]

## 2023-12-06 ENCOUNTER — Telehealth: Payer: Self-pay | Admitting: Family Medicine

## 2023-12-06 NOTE — Telephone Encounter (Signed)
 Copied from CRM [id number]. Topic: Clinical - Medication Question >> Dec 06, 2023  3:36 PM Montie POUR wrote: Reason for CRM:  Tammy [name]'s pharmacy told her that Dr. Vicci said that isosorbide  mononitrate (IMDUR ) 60 MG 24 hr tablet [Pharmacy Med Name: ISOSORBIDE  MONONITRATE ER 60 MG TAB]  was inappropriate for Tammy [name] to take. Please call [name] to discuss because she has been taken this medication for years. Her number is [phone].

## 2023-12-06 NOTE — Telephone Encounter (Signed)
 Requested Prescriptions  Refused Prescriptions Disp Refills   isosorbide  mononitrate (IMDUR ) 60 MG 24 hr tablet [Pharmacy Med Name: ISOSORBIDE  MONONITRATE ER 60 MG TAB] 90 tablet 1    Sig: TAKE  1 TABLET BY MOUTH AT BEDTIME     Cardiovascular:  Nitrates Passed - 12/06/2023  3:18 PM      Passed - Last BP in normal range    BP Readings from Last 1 Encounters:  11/26/23 124/70         Passed - Last Heart Rate in normal range    Pulse Readings from Last 1 Encounters:  11/26/23 66         Passed - Valid encounter within last 12 months    Recent Outpatient Visits           1 week ago Essential hypertension   [facility] [place], [place] P, DO   3 weeks ago Acute cystitis with hematuria   [facility] [place], Megan P, DO   1 month ago Type 2 diabetes mellitus with diabetic peripheral angiopathy without gangrene, without long-term current use of insulin  [facility])   [facility] [place], Megan P, DO   3 months ago Essential hypertension   [facility] [place], Megan P, DO   3 months ago Chronic bilateral low back pain without sciatica   [facility] Vicci Duwaine SQUIBB, DO       Future Appointments             In 2 weeks Wittenborn, Barnie, NP [facility]

## 2023-12-07 DIAGNOSIS — J4520 Mild intermittent asthma, uncomplicated: Secondary | ICD-10-CM | POA: Diagnosis not present

## 2023-12-07 DIAGNOSIS — N1832 Chronic kidney disease, stage 3b: Secondary | ICD-10-CM | POA: Diagnosis not present

## 2023-12-07 DIAGNOSIS — Z95828 Presence of other vascular implants and grafts: Secondary | ICD-10-CM | POA: Diagnosis not present

## 2023-12-07 DIAGNOSIS — E1151 Type 2 diabetes mellitus with diabetic peripheral angiopathy without gangrene: Secondary | ICD-10-CM | POA: Diagnosis not present

## 2023-12-07 DIAGNOSIS — Z981 Arthrodesis status: Secondary | ICD-10-CM | POA: Diagnosis not present

## 2023-12-07 DIAGNOSIS — E785 Hyperlipidemia, unspecified: Secondary | ICD-10-CM | POA: Diagnosis not present

## 2023-12-07 DIAGNOSIS — Z7901 Long term (current) use of anticoagulants: Secondary | ICD-10-CM | POA: Diagnosis not present

## 2023-12-07 DIAGNOSIS — B965 Pseudomonas (aeruginosa) (mallei) (pseudomallei) as the cause of diseases classified elsewhere: Secondary | ICD-10-CM | POA: Diagnosis not present

## 2023-12-07 DIAGNOSIS — I2699 Other pulmonary embolism without acute cor pulmonale: Secondary | ICD-10-CM | POA: Diagnosis not present

## 2023-12-07 DIAGNOSIS — Z7951 Long term (current) use of inhaled steroids: Secondary | ICD-10-CM | POA: Diagnosis not present

## 2023-12-07 DIAGNOSIS — M48061 Spinal stenosis, lumbar region without neurogenic claudication: Secondary | ICD-10-CM | POA: Diagnosis not present

## 2023-12-07 DIAGNOSIS — I2510 Atherosclerotic heart disease of native coronary artery without angina pectoris: Secondary | ICD-10-CM | POA: Diagnosis not present

## 2023-12-07 DIAGNOSIS — I5032 Chronic diastolic (congestive) heart failure: Secondary | ICD-10-CM | POA: Diagnosis not present

## 2023-12-07 DIAGNOSIS — Z7985 Long-term (current) use of injectable non-insulin antidiabetic drugs: Secondary | ICD-10-CM | POA: Diagnosis not present

## 2023-12-07 DIAGNOSIS — Z9181 History of falling: Secondary | ICD-10-CM | POA: Diagnosis not present

## 2023-12-07 DIAGNOSIS — I471 Supraventricular tachycardia, unspecified: Secondary | ICD-10-CM | POA: Diagnosis not present

## 2023-12-07 DIAGNOSIS — N390 Urinary tract infection, site not specified: Secondary | ICD-10-CM | POA: Diagnosis not present

## 2023-12-07 DIAGNOSIS — E1122 Type 2 diabetes mellitus with diabetic chronic kidney disease: Secondary | ICD-10-CM | POA: Diagnosis not present

## 2023-12-07 DIAGNOSIS — R531 Weakness: Secondary | ICD-10-CM | POA: Diagnosis not present

## 2023-12-07 DIAGNOSIS — I130 Hypertensive heart and chronic kidney disease with heart failure and stage 1 through stage 4 chronic kidney disease, or unspecified chronic kidney disease: Secondary | ICD-10-CM | POA: Diagnosis not present

## 2023-12-07 DIAGNOSIS — Z79899 Other long term (current) drug therapy: Secondary | ICD-10-CM | POA: Diagnosis not present

## 2023-12-07 DIAGNOSIS — J4489 Other specified chronic obstructive pulmonary disease: Secondary | ICD-10-CM | POA: Diagnosis not present

## 2023-12-08 DIAGNOSIS — B965 Pseudomonas (aeruginosa) (mallei) (pseudomallei) as the cause of diseases classified elsewhere: Secondary | ICD-10-CM | POA: Diagnosis not present

## 2023-12-08 DIAGNOSIS — I130 Hypertensive heart and chronic kidney disease with heart failure and stage 1 through stage 4 chronic kidney disease, or unspecified chronic kidney disease: Secondary | ICD-10-CM | POA: Diagnosis not present

## 2023-12-08 DIAGNOSIS — N390 Urinary tract infection, site not specified: Secondary | ICD-10-CM | POA: Diagnosis not present

## 2023-12-08 DIAGNOSIS — R531 Weakness: Secondary | ICD-10-CM | POA: Diagnosis not present

## 2023-12-08 DIAGNOSIS — E1122 Type 2 diabetes mellitus with diabetic chronic kidney disease: Secondary | ICD-10-CM | POA: Diagnosis not present

## 2023-12-08 DIAGNOSIS — I5032 Chronic diastolic (congestive) heart failure: Secondary | ICD-10-CM | POA: Diagnosis not present

## 2023-12-10 NOTE — Telephone Encounter (Signed)
 That is not inappropriate for her to take and is recommended by her cardiologist as well. Can we please find out what her pharmacy is talking about?

## 2023-12-11 IMAGING — MG DIGITAL DIAGNOSTIC BILAT W/ TOMO W/ CAD
6 of 10 series · 6 of 30 positions shown · non-contrast
Comparison: Previous exam(s).

CLINICAL DATA: The patient feels a painful lump in the region of
previous left lumpectomy.

EXAM:
DIGITAL DIAGNOSTIC BILATERAL MAMMOGRAM WITH TOMOSYNTHESIS AND CAD;
ULTRASOUND LEFT BREAST LIMITED
TECHNIQUE: Bilateral digital diagnostic mammography and breast tomosynthesis
was performed. The images were evaluated with computer-aided
detection.; Targeted ultrasound examination of the left breast was
performed.

[R MLO synth-2D]
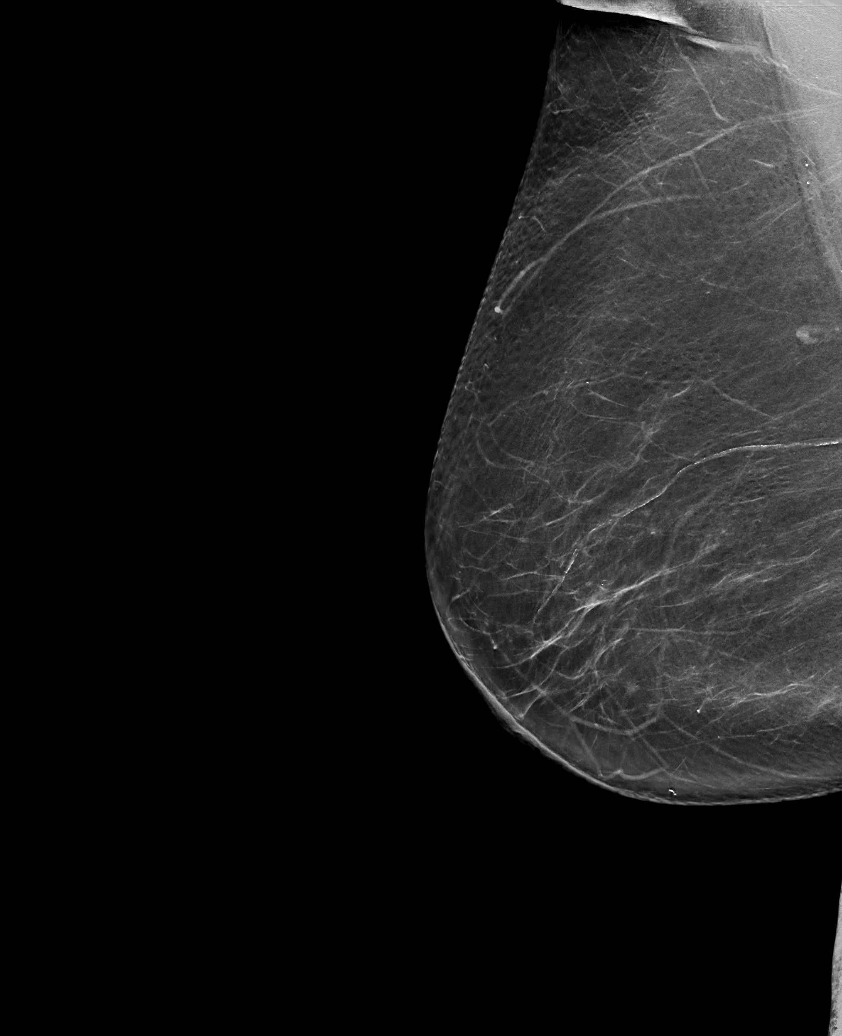

[L MLO synth-2D]
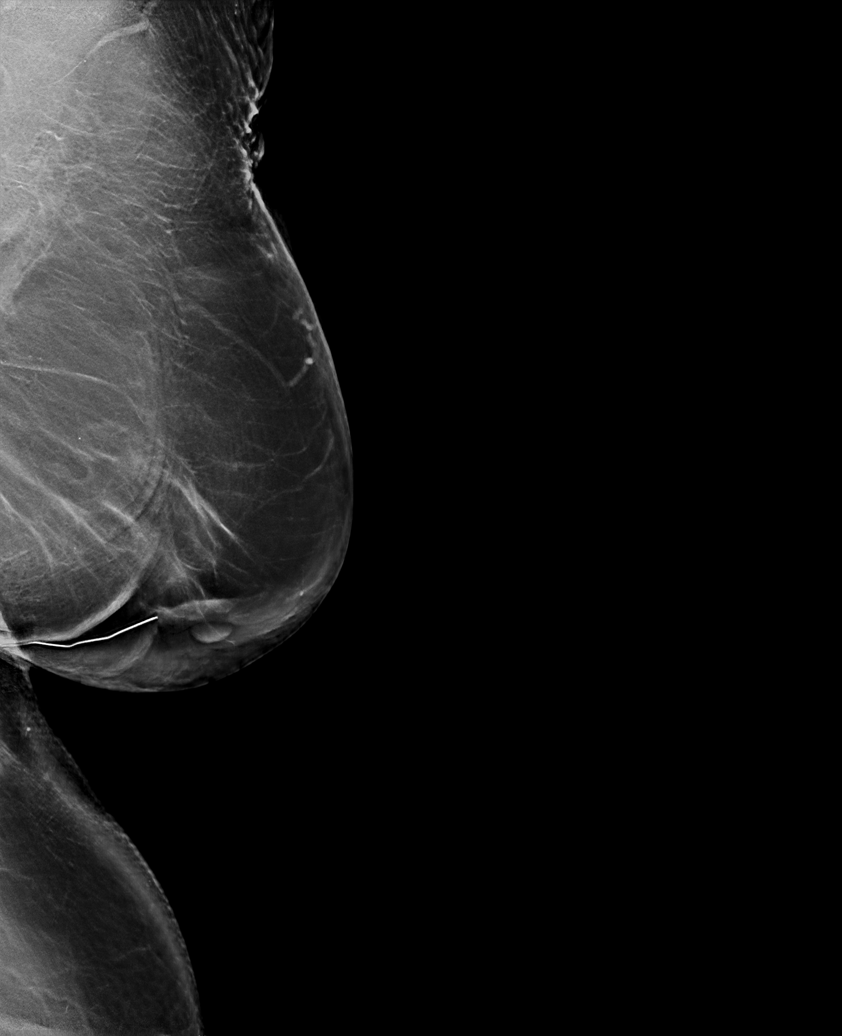

[L CC synth-2D]
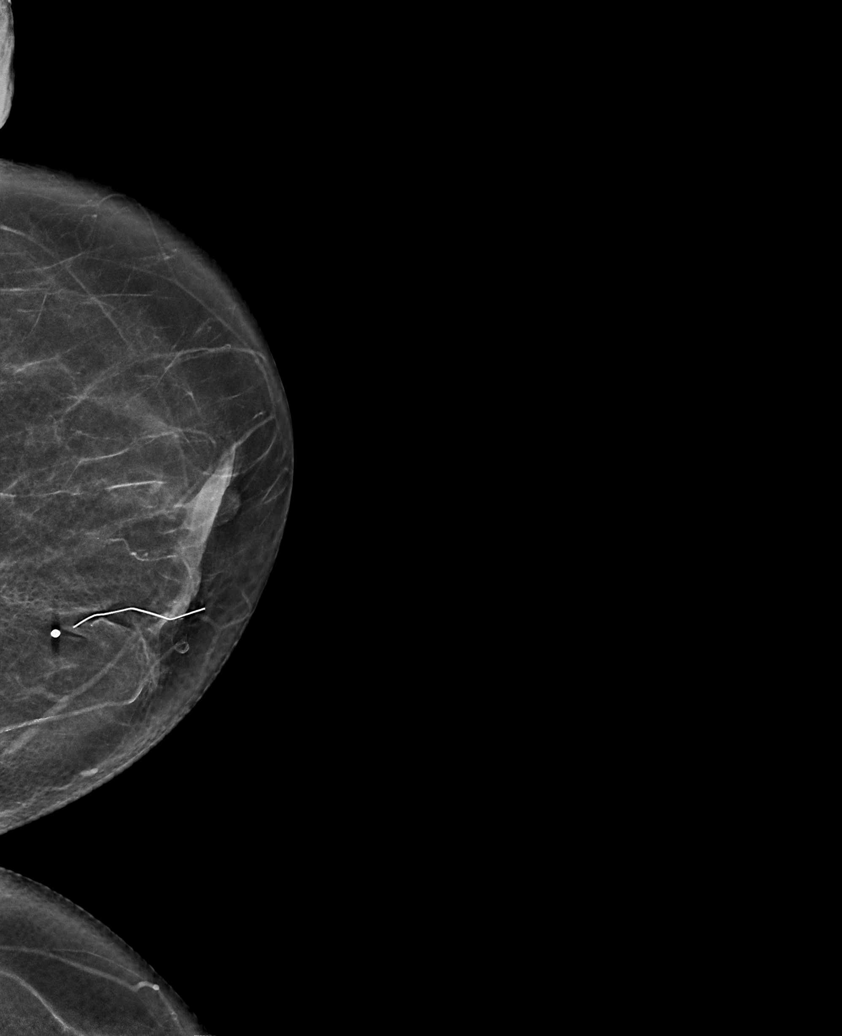

[R CC synth-2D]
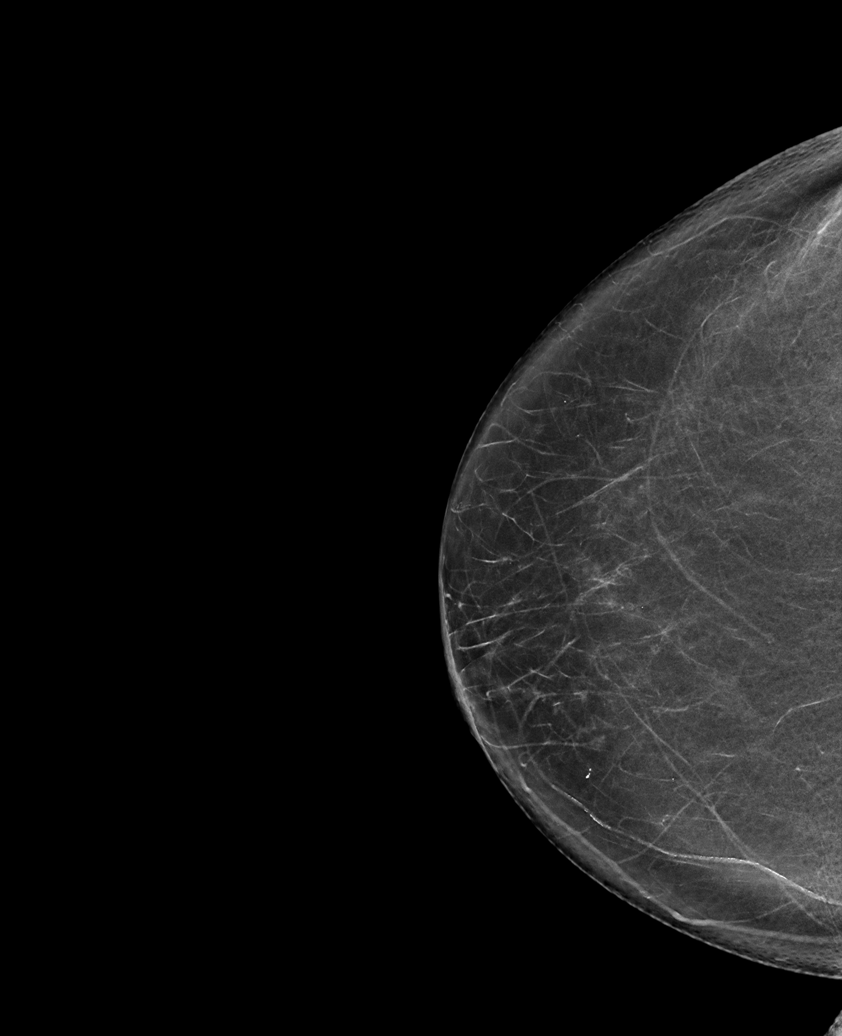

[L TAN synth-2D]
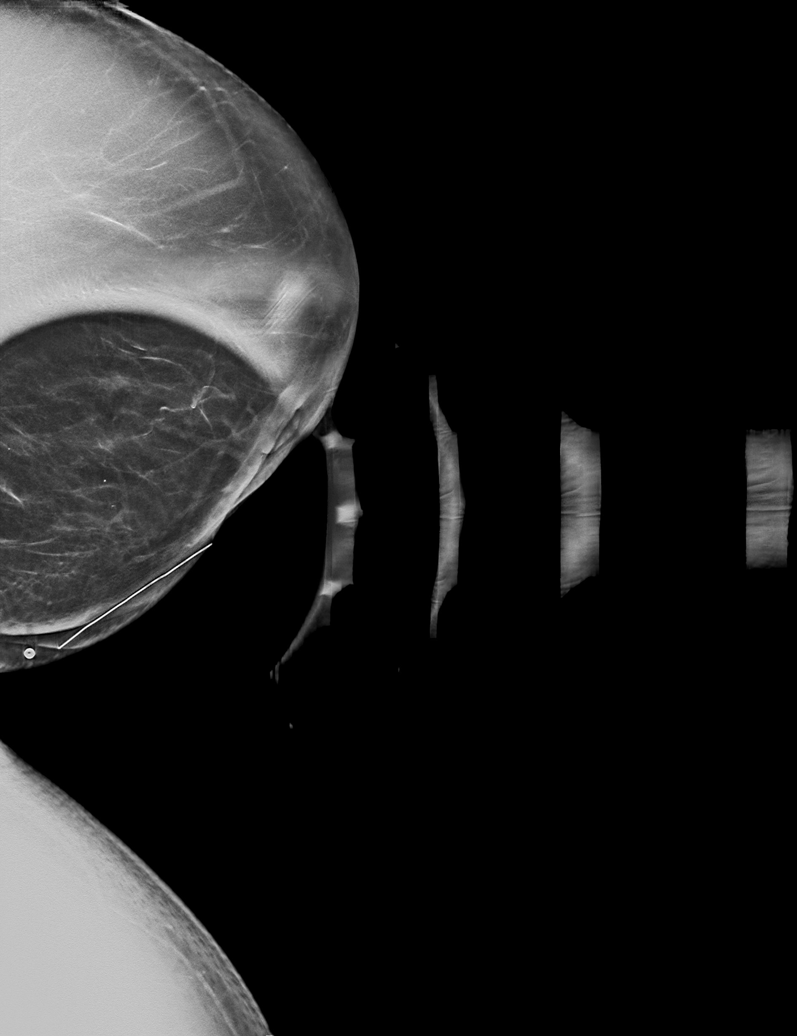

[R CC tomo · tomo slice 36/71.0]
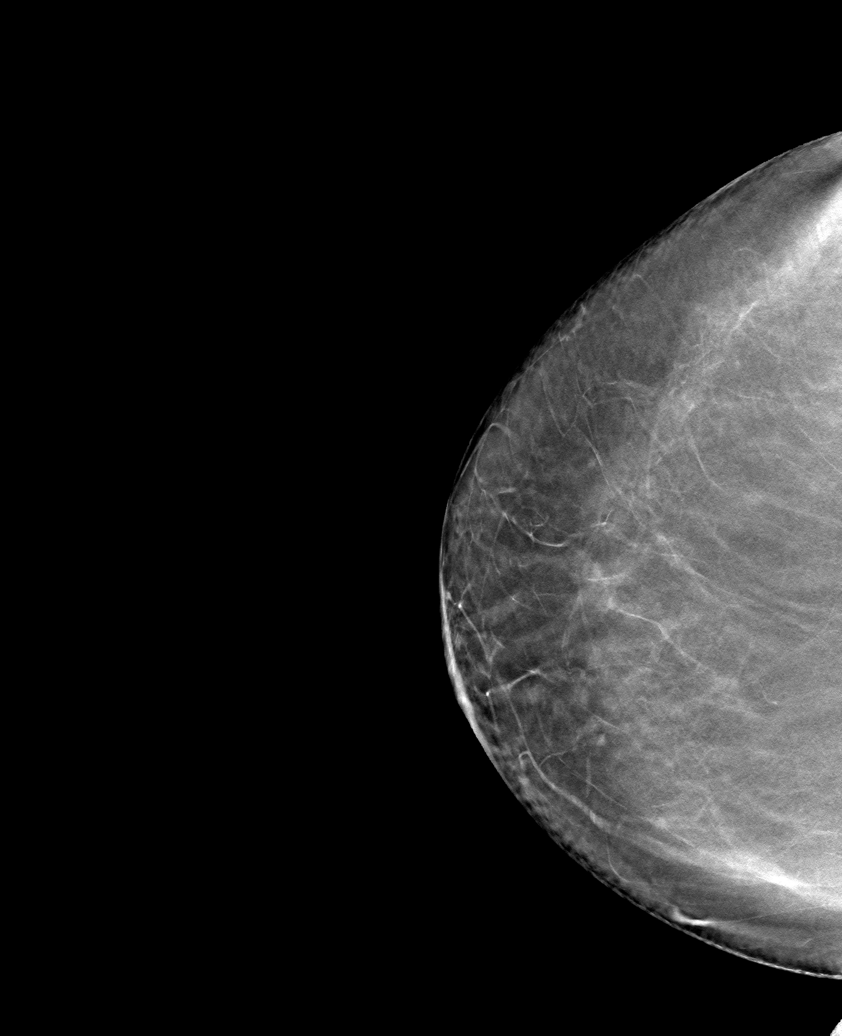

[6 of 30 positions shown; findings below may reference images not displayed]

ACR Breast Density Category b: There are scattered areas of
fibroglandular density.
FINDINGS: Post lumpectomy changes are identified in the breast near the region
of the patient's symptoms. No mammographic evidence of malignancy in
either breast.

Targeted ultrasound is performed, showing scarring with a small
seroma in the region of the patient's symptoms.
IMPRESSION: The patient's symptoms refer to her lumpectomy site with scarring
and a small seroma. No evidence of malignancy.

RECOMMENDATION:
Annual screening mammography.

I have discussed the findings and recommendations with the patient.
If applicable, a reminder letter will be sent to the patient
regarding the next appointment.

BI-RADS CATEGORY  2: Benign.

## 2023-12-12 DIAGNOSIS — N390 Urinary tract infection, site not specified: Secondary | ICD-10-CM | POA: Diagnosis not present

## 2023-12-12 DIAGNOSIS — B965 Pseudomonas (aeruginosa) (mallei) (pseudomallei) as the cause of diseases classified elsewhere: Secondary | ICD-10-CM | POA: Diagnosis not present

## 2023-12-12 DIAGNOSIS — I5032 Chronic diastolic (congestive) heart failure: Secondary | ICD-10-CM | POA: Diagnosis not present

## 2023-12-12 DIAGNOSIS — E1122 Type 2 diabetes mellitus with diabetic chronic kidney disease: Secondary | ICD-10-CM | POA: Diagnosis not present

## 2023-12-12 DIAGNOSIS — I130 Hypertensive heart and chronic kidney disease with heart failure and stage 1 through stage 4 chronic kidney disease, or unspecified chronic kidney disease: Secondary | ICD-10-CM | POA: Diagnosis not present

## 2023-12-12 DIAGNOSIS — R531 Weakness: Secondary | ICD-10-CM | POA: Diagnosis not present

## 2023-12-13 DIAGNOSIS — N1832 Chronic kidney disease, stage 3b: Secondary | ICD-10-CM | POA: Diagnosis not present

## 2023-12-13 DIAGNOSIS — E1122 Type 2 diabetes mellitus with diabetic chronic kidney disease: Secondary | ICD-10-CM | POA: Diagnosis not present

## 2023-12-13 DIAGNOSIS — E875 Hyperkalemia: Secondary | ICD-10-CM | POA: Diagnosis not present

## 2023-12-13 DIAGNOSIS — I129 Hypertensive chronic kidney disease with stage 1 through stage 4 chronic kidney disease, or unspecified chronic kidney disease: Secondary | ICD-10-CM | POA: Diagnosis not present

## 2023-12-13 DIAGNOSIS — R809 Proteinuria, unspecified: Secondary | ICD-10-CM | POA: Diagnosis not present

## 2023-12-17 ENCOUNTER — Other Ambulatory Visit: Payer: Self-pay

## 2023-12-18 NOTE — Telephone Encounter (Signed)
Spoke with pt. Taken care of

## 2023-12-24 ENCOUNTER — Telehealth: Payer: Self-pay

## 2023-12-24 NOTE — Progress Notes (Unsigned)
 Cardiology Clinic Note   Date: 12/25/2023 ID: Tammy [name], DOB 11/03/1945, MRN [id number]  Primary Cardiologist:  Evalene Lunger, MD  Chief Complaint   Tammy [name] is a 78 y.o. female who presents to the clinic today for routine follow up.   Patient Profile   Tammy [name] is followed by Dr. Gollan for the history outlined below.      Past medical history significant for: CAD. LHC 01/04/2011 (angina): Proximal LAD 30%.  Mid LAD 40%.  Mid LCx 40%.  Proximal RCA 80%.  Mid RCA 100% with good collaterals from distal LAD.  Recommend medical management. LHC 12/07/2011 (angina): Mid LAD 50%.  Distal LAD 50%.  Proximal RCA 70%.  Mid RCA known CTO. LHC 04/04/2019 (angina): Proximal to mid RCA 100% with left-to-right collaterals.  Mid LAD 45%.  Stable disease when compared to September 2013. R/LHC 09/26/2021 (exertional dyspnea): Mid LAD 45%.  Proximal to mid RCA 100% with well-developed left-to-right collaterals.  Proximal RCA 90%.  No significant change from January 2021.  Recommend continuing aggressive medical therapy.  Right heart catheterization showed mildly elevated filling pressures, mild pulmonary hypertension and normal cardiac output. Echo 03/10/2022: EF 55 to 60%.  No RWMA.  Normal diastolic parameters.  Normal RV size/function.  No significant valvular abnormalities. PSVT. 14-day ZIO 12/29/2022: Heart rate 65 to 160 bpm, average 82 bpm.  561 nonsustained runs of SVT longest 19 beats.  Rare ectopy.  No sustained arrhythmias.  No A-fib. PAD. Aortogram and lower extremity angiogram 10/23/2022: PTA right tibioperoneal trunk and most proximal posterior tibial artery.  PTA right SFA and popliteal arteries.  Stent placement right SFA and most proximal popliteal artery. ABI 11/29/2022: Right ABI/TBI within normal range.  Mild left lower extremity arterial disease. Abdominal aortic atherosclerosis. Carotid artery disease. Carotid duplex 08/13/2014: Bilateral ICA 1 to  39%. Hypertension. Hyperlipidemia. Lipid panel 04/13/2023: LDL 76, HDL 61, TG 252, total 178.  PE/DVT. COPD. T2DM. Hypothyroidism.   CKD stage IIIb.  In summary, patient has a history of CAD with CTO of RCA dating back to October 2012 as detailed above.  She had repeat catheterization in September 2013 which showed stable coronary disease. Echo at that time showed normal LV function, mild inferior hypokinesis suggestive of CAD, mild LVH, mild diastolic dysfunction, normal PA pressure.  In January 2021 she complained of chest pain worsened with exertion and relieved with NTG.  She underwent LHC which showed stable disease when compared to catheterization in September 2013.  In July 2023 patient complained of most severe episode of chest pain resolved with several NTG.  She underwent heart catheterization which was unchanged from prior catheterization.  Aggressive medical therapy was recommended.  Echo December 2023 showed normal LV/RV function as detailed above.  Patient underwent lower extremity intervention with [facility] in August 2024 as detailed above.   Patient was seen in the office on 04/03/2023 for routine follow up. She was doing well at that time with occasional brief episodes of chest discomfort that resolved on its own and typically occurred without exertion. She attributed chest pain to poorly healed rib fractures.  She reported occasional palpitations occurring in the morning and lasting a few minutes before resolving. BP had been trending down. BP at the time of her visit 100/60. Isosorbide  was decreased.    Patient was last seen in the office by me on 09/11/2023 for routine follow-up.  She reported sharp discomfort starting in the right breast and moving across to her left breast occurring  daily and lasting hours.  She wondered if discomfort was related to lumpectomy performed in 2020.  She also complained of palpitations associated with chest discomfort.  She had stopped Toprol  per PCP  instructions.  She monitors heart rate with a smart watch and gets alerted when heart rate increases above 101 bpm.  She also complained of increasing lightheadedness with position changes and while walking.  She declined carotid ultrasound.  She also declined ischemic evaluation.  She was instructed to stop isosorbide  and restart metoprolol  at 25 mg twice daily.  Patient presented to the ED on 11/03/2023 for fall x 2.  Patient had 1 fall for which EMS evaluated her in her home but she refused transport.  90 minutes later she had a second fall tripping over her dog and sustaining an abrasion above left eye.  She reported to EMS she felt confused and weak all over and was transported to the ED.  She was found to have a UTI.  She was treated and discharged on 11/05/2023.     History of Present Illness    Today, patient is accompanied by her husband. She has multiple complaints today. She reports not being able to walk well secondary to bilateral leg weakness as well as bilateral knee pain. She has a history of multiple back surgeries as well as PAD in bilateral lower extremities and possibly some neuropathy. She recently began experiencing bilateral leg spasms that occur randomly. She walks with a rollator all of the time since hospital discharge at then end of August. She reports shortness of breath is well managed with inhalers. She continues to have chest pain that begins on the right side of her chest and radiates to the left lasting minutes and resolving on its own occurring at rest. These episodes of pain can occur several times a day and occur daily. Episodes are the same as they were in July and did not worsen since stopping isosorbide . She declined ischemic evaluation at that time. She also experiences left breast pain. She is concerned, as this was the same pain she had prior to being diagnosed with breast cancer. She is due for a mammogram. She continues to have lightheadedness with position changes  and with walking. She previously deferred carotid US .     ROS: All other systems reviewed and are otherwise negative except as noted in History of Present Illness.  EKGs/Labs Reviewed       EKG not performed today.   11/03/2023: ALT 17; AST 22 11/04/2023: BUN 24; Creatinine, Ser 1.33; Potassium 4.6; Sodium 135   11/04/2023: Hemoglobin 13.1; WBC 7.8   11/03/2023: TSH 7.788    Physical Exam    VS:  BP 114/71   Pulse 71   Ht 5' 6 (1.676 m)   Wt 179 lb (81.2 kg)   SpO2 95%   BMI 28.89 kg/m  , BMI Body mass index is 28.89 kg/m.  GEN: Well nourished, well developed, in no acute distress. Neck: No JVD or carotid bruits. Cardiac:  RRR.  No murmur. No rubs or gallops.   Respiratory:  Respirations regular and unlabored. Clear to auscultation without rales, wheezing or rhonchi. GI: Soft, nontender, nondistended. Extremities: Radials 2+ and equal bilaterally. No clubbing or cyanosis. No edema.  Skin: Warm and dry, no rash. Neuro: Strength intact.  Assessment & Plan   CAD LHC x 4 (2012, 2013, 2021, 2023) showing mild to moderate stenosis in LAD and CTO of RCA.  Patient reports continued episodes of chest pain  that starts on the right side of her chest and radiates to the left lasting minutes and resolving on its own. These episodes occur daily without exertion and can happen several times in one day. No associated dyspnea. Pain is unchanged from last visit in July and did not worsen with stopping isosorbide . Discussed ischemic evaluation and she would like to hold off for now.  - Continue metoprolol , rosuvastatin , as needed SL NTG.  Patient not on aspirin  secondary to Eliquis .   Palpitations/PSVT 14-day ZIO October 2024 ordered by PCP showed 561 runs of PSVT.  Patient does not complain of palpitations today. RRR on exam today.  - Continue metoprolol .   PAD S/p PTA and stent placement right lower extremity August 2024.  Patient reports R>L lower extremity pain mainly in her knee. She  has a follow up soon with ortho. She reports she is not walking well and feels weakness in bilateral lower extremities. She is ambulating with a rollator. No edema today. -Continue rosuvastatin  and Eliquis . -Followed by [facility].   Hypertension BP today 114/71. She reports lightheadedness (see below).  - Continue metoprolol .   Carotid artery disease/Lightheadedness Carotid duplex June 2016 bilateral ICA 1-39%. Patient reports continued lightheadedness. Episodes occur with position changes and while walking. No carotid bruit auscultated.  - Schedule carotid duplex.    Hyperlipidemia LDL 76 January 2025, not at goal.  -Continue rosuvastatin .   History of PE/DVT No report of shortness of breath or DOE. Denies spontaneous bleeding concerns. -Continue Eliquis . -Anticoagulation managed by PCP.  Disposition: Schedule carotid duplex. Return in 3 months or sooner as needed.          Signed, Tammy HERO. Avi Archuleta, DNP, NP-C

## 2023-12-24 NOTE — Telephone Encounter (Signed)
Called and gave verbal order per Dr. Johnson.  °

## 2023-12-24 NOTE — Telephone Encounter (Signed)
 Copied from CRM [id number]. Topic: Clinical - Home Health Verbal Orders >> Dec 24, 2023 12:41 PM Mia F wrote: Caller/Agency: Sonya from [facility] Callback Number: [phone] Service Requested: Physical Therapy Frequency: EVAL Any new concerns about the patient? No

## 2023-12-24 NOTE — Telephone Encounter (Signed)
 OK for verbal orders?

## 2023-12-25 ENCOUNTER — Encounter: Payer: Self-pay | Admitting: Student

## 2023-12-25 ENCOUNTER — Ambulatory Visit: Attending: Student | Admitting: Student

## 2023-12-25 VITALS — BP 114/71 | HR 71 | Ht 66.0 in | Wt 179.0 lb

## 2023-12-25 DIAGNOSIS — Z86718 Personal history of other venous thrombosis and embolism: Secondary | ICD-10-CM | POA: Insufficient documentation

## 2023-12-25 DIAGNOSIS — I739 Peripheral vascular disease, unspecified: Secondary | ICD-10-CM | POA: Diagnosis not present

## 2023-12-25 DIAGNOSIS — R42 Dizziness and giddiness: Secondary | ICD-10-CM | POA: Insufficient documentation

## 2023-12-25 DIAGNOSIS — Z86711 Personal history of pulmonary embolism: Secondary | ICD-10-CM | POA: Insufficient documentation

## 2023-12-25 DIAGNOSIS — I471 Supraventricular tachycardia, unspecified: Secondary | ICD-10-CM | POA: Diagnosis not present

## 2023-12-25 DIAGNOSIS — E785 Hyperlipidemia, unspecified: Secondary | ICD-10-CM | POA: Diagnosis present

## 2023-12-25 DIAGNOSIS — I10 Essential (primary) hypertension: Secondary | ICD-10-CM | POA: Insufficient documentation

## 2023-12-25 DIAGNOSIS — I6523 Occlusion and stenosis of bilateral carotid arteries: Secondary | ICD-10-CM | POA: Diagnosis not present

## 2023-12-25 DIAGNOSIS — I2510 Atherosclerotic heart disease of native coronary artery without angina pectoris: Secondary | ICD-10-CM | POA: Diagnosis not present

## 2023-12-25 DIAGNOSIS — R002 Palpitations: Secondary | ICD-10-CM | POA: Insufficient documentation

## 2023-12-25 NOTE — Patient Instructions (Signed)
 Medication Instructions:   Your physician recommends that you continue on your current medications as directed. Please refer to the Current Medication list given to you today.    *If you need a refill on your cardiac medications before your next appointment, please call your pharmacy*  Lab Work:  None ordered at this time   If you have labs (blood work) drawn today and your tests are completely normal, you will receive your results only by:  MyChart Message (if you have MyChart) OR  A paper copy in the mail If you have any lab test that is abnormal or we need to change your treatment, we will call you to review the results.  Testing/Procedures:  Your physician has requested that you have a carotid duplex. This test is an ultrasound of the carotid arteries in your neck. It looks at blood flow through these arteries that supply the brain with blood.   Allow one hour for this exam.  There are no restrictions or special instructions.  This will take place at [address] [facility]) [address], [place] [address]  Please note: We ask at that you not bring children with you during ultrasound (echo/ vascular) testing. Due to room size and safety concerns, children are not allowed in the ultrasound rooms during exams. Our front office staff cannot provide observation of children in our lobby area while testing is being conducted. An adult accompanying a patient to their appointment will only be allowed in the ultrasound room at the discretion of the ultrasound technician under special circumstances. We apologize for any inconvenience.   Referrals:  None ordered at this time   Follow-Up:  At [facility], you and your health needs are our priority.  As part of our continuing mission to provide you with exceptional heart care, our providers are all part of one team.  This team includes your primary Cardiologist (physician) and Advanced Practice Providers or APPs (Physician  Assistants and Nurse Practitioners) who all work together to provide you with the care you need, when you need it.  Your next appointment:   3 month(s)  Provider:    Timothy Gollan, MD or Barnie Hila, NP    We recommend signing up for the patient portal called MyChart.  Sign up information is provided on this After Visit Summary.  MyChart is used to connect with patients for Virtual Visits (Telemedicine).  Patients are able to view lab/test results, encounter notes, upcoming appointments, etc.  Non-urgent messages can be sent to your provider as well.   To learn more about what you can do with MyChart, go to [email or web address].

## 2023-12-26 ENCOUNTER — Other Ambulatory Visit: Payer: Self-pay | Admitting: Family Medicine

## 2023-12-27 ENCOUNTER — Other Ambulatory Visit: Payer: Self-pay | Admitting: Family Medicine

## 2023-12-27 DIAGNOSIS — N390 Urinary tract infection, site not specified: Secondary | ICD-10-CM | POA: Diagnosis not present

## 2023-12-27 DIAGNOSIS — I5032 Chronic diastolic (congestive) heart failure: Secondary | ICD-10-CM | POA: Diagnosis not present

## 2023-12-27 DIAGNOSIS — R531 Weakness: Secondary | ICD-10-CM | POA: Diagnosis not present

## 2023-12-27 DIAGNOSIS — I130 Hypertensive heart and chronic kidney disease with heart failure and stage 1 through stage 4 chronic kidney disease, or unspecified chronic kidney disease: Secondary | ICD-10-CM | POA: Diagnosis not present

## 2023-12-27 DIAGNOSIS — B965 Pseudomonas (aeruginosa) (mallei) (pseudomallei) as the cause of diseases classified elsewhere: Secondary | ICD-10-CM | POA: Diagnosis not present

## 2023-12-27 DIAGNOSIS — E1122 Type 2 diabetes mellitus with diabetic chronic kidney disease: Secondary | ICD-10-CM | POA: Diagnosis not present

## 2023-12-28 NOTE — Telephone Encounter (Signed)
 Requested Prescriptions  Pending Prescriptions Disp Refills   montelukast  (SINGULAIR ) 10 MG tablet [Pharmacy Med Name: MONTELUKAST  SODIUM 10 MG TAB] 90 tablet 0    Sig: TAKE 1 TABLET BY MOUTH AT BEDTIME     Pulmonology:  Leukotriene Inhibitors Passed - 12/28/2023  8:22 AM      Passed - Valid encounter within last 12 months    Recent Outpatient Visits           1 month ago Essential hypertension   [place] [facility] [place], Megan P, DO   1 month ago Acute cystitis with hematuria   [place] [facility] [place], Megan P, DO   2 months ago Type 2 diabetes mellitus with diabetic peripheral angiopathy without gangrene, without long-term current use of insulin  [facility])   [place] [facility] [place], Megan P, DO   3 months ago Essential hypertension   [place] [facility] [place], Megan P, DO   4 months ago Chronic bilateral low back pain without sciatica   [facility] Vicci Duwaine SQUIBB, DO       Future Appointments             In 3 months Wittenborn, Barnie, NP [facility]

## 2023-12-31 NOTE — Telephone Encounter (Signed)
 Requested medication (s) are due for refill today: yes  Requested medication (s) are on the active medication list: yes  Last refill:  08/03/23  Future visit scheduled: yes  Notes to clinic:  Medication not assigned to a protocol, review manually.       Requested Prescriptions  Pending Prescriptions Disp Refills   TRELEGY ELLIPTA  100-62.5-25 MCG/ACT AEPB [Pharmacy Med Name: TRELEGY ELLIPTA  100-62.5-25 MCG/ACT] 180 each 0    Sig: INHALE 1 PUFF INTO LUNGS ONCE DAILY     Off-Protocol Failed - 12/31/2023  8:25 AM      Failed - Medication not assigned to a protocol, review manually.      Passed - Valid encounter within last 12 months    Recent Outpatient Visits           1 month ago Essential hypertension   [facility] [place], Megan P, DO   1 month ago Acute cystitis with hematuria   [place] [facility] [place], [place] P, DO   2 months ago Type 2 diabetes mellitus with diabetic peripheral angiopathy without gangrene, without long-term current use of insulin  [facility])   [facility] [place], Megan P, DO   3 months ago Essential hypertension   [place] [facility] [place], Megan P, DO   4 months ago Chronic bilateral low back pain without sciatica   [place] Vicci Duwaine SQUIBB, DO       Future Appointments             In 2 months Wittenborn, Barnie, NP [facility]

## 2024-01-09 ENCOUNTER — Other Ambulatory Visit: Payer: Self-pay | Admitting: Family Medicine

## 2024-01-09 DIAGNOSIS — Z1231 Encounter for screening mammogram for malignant neoplasm of breast: Secondary | ICD-10-CM

## 2024-01-28 ENCOUNTER — Ambulatory Visit: Admitting: Family Medicine

## 2024-01-31 DIAGNOSIS — N390 Urinary tract infection, site not specified: Secondary | ICD-10-CM | POA: Diagnosis not present

## 2024-01-31 DIAGNOSIS — Q6211 Congenital occlusion of ureteropelvic junction: Secondary | ICD-10-CM | POA: Diagnosis not present

## 2024-02-04 ENCOUNTER — Ambulatory Visit: Admitting: Family Medicine

## 2024-02-04 NOTE — Progress Notes (Unsigned)
 [facility] CONSULT NOTE  Patient Care Team: Vicci Duwaine SQUIBB, DO as PCP - General (Family Medicine) Perla, Evalene PARAS, MD as PCP - Cardiology (Cardiology) Perla Evalene PARAS, MD as Consulting Physician (Cardiology) Twylla Glendia BROCKS, MD as Consulting Physician (Urology) Douglas Rule, MD as Consulting Physician (Nephrology) Ezzard Distel, MD as Referring Physician (Orthopedic Surgery) Christi Vannie PARAS, MD as Attending Physician (Endocrinology) Marea Selinda RAMAN, MD as Referring Physician (Vascular Surgery) Rennie Cindy SAUNDERS, MD as Consulting Physician (Oncology)  CHIEF COMPLAINTS/PURPOSE OF CONSULTATION: Breast cancer  #  Oncology History Overview Note   # July 2020 Left Breast- IMC; 14 mm; Grade: 2; Lymphovascular Invasion: Not identified;  pT1c pN0(sn); ER positive, PR positive, HER2 negative. [s/p lumpec; Dr.Byrnett]; rs/p RT [finished Sep, 9th 2020]  # Sep end 2020- START Arimidexa  # JAN 2022- 36mm/left upper lobe lung nodule; PET positive; right upper lobe pneumonia/suspected reactive adenopathy. STAGE I Lung ca [suspected based on imaging no biopsy]- SBRT Dr.Crystal [May 2022]  # CKD-III/COPD/ CHF-compesated/PAD; CAD  DIAGNOSIS: Left breast cancer  STAGE: 1  ;GOALS: Cure  CURRENT/MOST RECENT THERAPY: arimidex    Malignant neoplasm of lower-inner quadrant of left female breast (HCC) (Resolved)  08/14/2018 Initial Diagnosis   Malignant neoplasm of lower-inner quadrant of left female breast [facility])   Carcinoma of lower-inner quadrant of left breast in female, estrogen receptor positive (HCC)  10/02/2018 Initial Diagnosis   Carcinoma of lower-inner quadrant of left breast in female, estrogen receptor positive (HCC)    HISTORY OF PRESENTING ILLNESS:In a motorized scooter.  Accompanied by her husband.  Tammy [name] 78 y.o.  female to history of stage I ER PR positive/HER-2 negative breast cancer ; and also stage I presumed left upper lobe lung cancer [NO bX]-s/p  SBRT is here for follow-up/.  Pt in for follow up, reports having pain in left breast regularly and sometimes a shooting pain.  This is chronic.  Not getting any worse.  Pt taking her arimidex . No side effects right now. Appetite is okay.   Chronic back pain is chronic fatigue.  Denies any new lumps or bumps.   Review of Systems  Constitutional:  Negative for chills, diaphoresis, fever, malaise/fatigue and weight loss.  HENT:  Negative for nosebleeds and sore throat.   Eyes:  Negative for double vision.  Respiratory:  Negative for cough, hemoptysis, sputum production, shortness of breath and wheezing.   Cardiovascular:  Negative for chest pain, palpitations, orthopnea and leg swelling.  Gastrointestinal:  Negative for abdominal pain, blood in stool, constipation, diarrhea, heartburn, melena, nausea and vomiting.  Genitourinary:  Negative for dysuria, frequency and urgency.  Musculoskeletal:  Positive for back pain and joint pain.  Skin: Negative.  Negative for itching and rash.  Neurological:  Negative for dizziness, tingling, focal weakness, weakness and headaches.  Endo/Heme/Allergies:  Does not bruise/bleed easily.  Psychiatric/Behavioral:  Negative for depression. The patient is not nervous/anxious and does not have insomnia.      MEDICAL HISTORY:  Past Medical History:  Diagnosis Date  . Asthma   . Benign neoplasm of colon   . Breast cancer (HCC) 08/2018   left breast  . Cervical disc disease    bulging - no limitations per pt  . Chronic diastolic CHF (congestive heart failure) (HCC)    a. 07/2012 Echo: Nl EF. mild inferoseptal HK, Gr 2 DD, mild conc LVH, mild PR/TR, mild PAH; b. 08/2019 Echo: EF 55-60%, no rwma, Gr1 DD. Nl RV size/fxn.  . CKD (chronic kidney  disease), stage III (HCC)   . COPD (chronic obstructive pulmonary disease) (HCC)   . Coronary artery disease    a. 11/2011 Cath: LAD 29m/d, LCX min irregs, RCA 70p, 163m with L->R collats, EF 60%-->Med Rx; b. 03/2019  Cath: LM nl, LAD 40-27m. LCX large, mild diff dzs. OM1/2 nl. RCA known to be 100m. L-->R collats from LAD & LCX/OM.  SABRA Diabetes mellitus without complication (HCC)   . Diagnosis unknown 05/07/2022  . Fusion of lumbar spine 12/22/2015  . GERD (gastroesophageal reflux disease)   . History of DVT (deep vein thrombosis)   . History of tobacco abuse    a. Quit 2011.  SABRA Hyperlipidemia   . Hypertension   . Hypothyroidism   . Lung cancer (HCC)   . Malignant neoplasm of unspecified site of right female breast (HCC) 03/14/1999  . Obesity   . Palpitations   . Personal history of radiation therapy   . PONV (postoperative nausea and vomiting)   . Pulmonary embolus (HCC) 12/25/2013   RLL. Presented with SOB and elevated d-dimer.   SABRA PVD (peripheral vascular disease)    a. PTA of right leg and left femoral artery with stenosis; b. 09/2020 ABI: R 0.93, L 0.88 - unchanged from prior study.  . Stroke (HCC) 2015   TIAs - no deficits    SURGICAL HISTORY: Past Surgical History:  Procedure Laterality Date  . ANGIOPLASTY / STENTING FEMORAL     PVD; angioplasty right leg and left femoral artery with stenosis.   . APPENDECTOMY  12/2014  . BACK SURGERY  03/2012   nerve stimulator inserted  . BACK SURGERY     screws, rods replaced with new hardware.  SABRA BACK SURGERY  11/2016  . BREAST LUMPECTOMY Left 08/26/2018  . BREAST SURGERY    . CARDIAC CATHETERIZATION  12/07/2011   Mid LAD 50%, distal LAD 50%, mid RCA 70%, distal RCA 100%   . CARDIAC CATHETERIZATION  01/04/2011   100% occluded mid RCA with good collaterals from distal LAD, normal LVEF.  SABRA CATARACT EXTRACTION Right 12/21/2021  . CHOLECYSTECTOMY    . COLONOSCOPY  2013  . COLONOSCOPY WITH PROPOFOL  N/A 05/24/2015   Procedure: COLONOSCOPY WITH PROPOFOL ;  Surgeon: Rogelia Copping, MD;  Location: [facility];  Service: Endoscopy;  Laterality: N/A;  Diabetic - oral meds  . COLONOSCOPY WITH PROPOFOL  N/A 01/28/2019   Procedure: COLONOSCOPY WITH  PROPOFOL ;  Surgeon: Janalyn Keene NOVAK, MD;  Location: [facility];  Service: Endoscopy;  Laterality: N/A;  . DIAGNOSTIC LAPAROSCOPY    . ESOPHAGOGASTRODUODENOSCOPY (EGD) WITH PROPOFOL  N/A 01/28/2019   Procedure: ESOPHAGOGASTRODUODENOSCOPY (EGD) WITH PROPOFOL ;  Surgeon: Janalyn Keene NOVAK, MD;  Location: [facility];  Service: Endoscopy;  Laterality: N/A;  . LAPAROSCOPIC APPENDECTOMY N/A 01/06/2015   Procedure: APPENDECTOMY LAPAROSCOPIC;  Surgeon: Louanne KANDICE Muse, MD;  Location: [facility] ORS;  Service: General;  Laterality: N/A;  . LEFT HEART CATH AND CORONARY ANGIOGRAPHY Left 04/04/2019   Procedure: LEFT HEART CATH AND CORONARY ANGIOGRAPHY;  Surgeon: Perla Evalene PARAS, MD;  Location: [facility];  Service: Cardiovascular;  Laterality: Left;  . LOWER EXTREMITY INTERVENTION Right 10/23/2022   Procedure: Lower Extremity Angiography;  Surgeon: Marea Selinda RAMAN, MD;  Location: [facility] CV LAB;  Service: Cardiovascular;  Laterality: Right;  . MASTECTOMY W/ SENTINEL NODE BIOPSY Left 08/26/2018   Procedure: PARTIAL MASTECTOMY WIDE EXCISION WITH SENTINEL LYMPH NODE BIOPSY LEFT;  Surgeon: Dessa Reyes ORN, MD;  Location: [facility];  Service: General;  Laterality: Left;  .  POLYPECTOMY  05/24/2015   Procedure: POLYPECTOMY;  Surgeon: Rogelia Copping, MD;  Location: [facility];  Service: Endoscopy;;  . REPLACEMENT TOTAL KNEE Right   . RIGHT/LEFT HEART CATH AND CORONARY ANGIOGRAPHY Bilateral 09/26/2021   Procedure: RIGHT/LEFT HEART CATH AND CORONARY ANGIOGRAPHY;  Surgeon: Darron Deatrice LABOR, MD;  Location: [facility] LAB;  Service: Cardiovascular;  Laterality: Bilateral;  . THYROIDECTOMY    . TOTAL KNEE ARTHROPLASTY Right 11/13/2019  . TRIGGER FINGER RELEASE      SOCIAL HISTORY: Social History   Socioeconomic History  . Marital status: Married    Spouse name: Not on file  . Number of children: 2  . Years of education: some college, games developer    . Highest education level: Associate degree: occupational, scientist, product/process development, or vocational program  Occupational History  . Occupation: Retired  Tobacco Use  . Smoking status: Former    Current packs/day: 0.00    Average packs/day: 0.5 packs/day for 15.0 years (7.5 ttl pk-yrs)    Types: Cigarettes    Start date: 03/13/1992    Quit date: 03/14/2007    Years since quitting: 16.9  . Smokeless tobacco: Never  Vaping Use  . Vaping status: Never Used  Substance and Sexual Activity  . Alcohol  use: No    Alcohol /week: 0.0 standard drinks of alcohol   . Drug use: Never  . Sexual activity: Not Currently    Birth control/protection: Post-menopausal  Other Topics Concern  . Not on file  Social History Narrative   [place]ives at home [place]. Quit smoking 10 years ago; never alcohol . 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    Social Drivers of Corporate Investment Banker Strain: Low Risk  (04/12/2023)   Overall Financial Resource Strain (CARDIA)   . Difficulty of Paying Living Expenses: Not hard at all  Food Insecurity: No Food Insecurity (11/03/2023)   Hunger Vital Sign   . Worried About Programme Researcher, Broadcasting/film/video in the Last Year: Never true   . Ran Out of Food in the Last Year: Never true  Transportation Needs: No Transportation Needs (11/03/2023)   PRAPARE - Transportation   . Lack of Transportation (Medical): No   . Lack of Transportation (Non-Medical): No  Physical Activity: Inactive (04/12/2023)   Exercise Vital Sign   . Days of Exercise per Week: 0 days   . Minutes of Exercise per Session: 0 min  Stress: Stress Concern Present (04/12/2023)   [employer] of Occupational Health - Occupational Stress Questionnaire   . Feeling of Stress : To some extent  Social Connections: Unknown (11/03/2023)   Social Connection and Isolation Panel   . Frequency of Communication with Friends and Family: More than three times a week   . Frequency of Social  Gatherings with Friends and Family: More than three times a week   . Attends Religious Services: Not on file   . Active Member of Clubs or Organizations: Yes   . Attends Banker Meetings: 1 to 4 times per year   . Marital Status: Married  Catering Manager Violence: Not At Risk (11/03/2023)   Humiliation, Afraid, Rape, and Kick questionnaire   . Fear of Current or Ex-Partner: No   . Emotionally Abused: No   . Physically Abused: No   . Sexually Abused: No    FAMILY HISTORY: Family History  Problem Relation Age of Onset  . Cancer Mother  colon  . Hypertension Father   . Heart disease Father   . Heart attack Father   . Diabetes Father   . Cancer Sister        lung  . COPD Sister   . COPD Sister   . Heart attack Brother   . Diabetes Brother   . Hypertension Brother   . Heart disease Brother   . Heart attack Brother   . Diabetes Brother   . Hypertension Brother   . Heart disease Brother   . Heart attack Brother   . Diabetes Brother   . Hypertension Brother   . Heart disease Brother   . Colon cancer Brother 39  . Lung cancer Brother   . Breast cancer Neg Hx     ALLERGIES:  is allergic to hydrocodone , aleve [naproxen sodium], contrast media [iodinated contrast media], ioxaglate, ranexa [ranolazine], and sulfa antibiotics.  MEDICATIONS:  Current Outpatient Medications  Medication Sig Dispense Refill  . acetaminophen  (TYLENOL ) 500 MG tablet Take 500 mg by mouth every 6 (six) hours as needed for moderate pain. Takes up to 3 tablets/day PRN for increased pain    . albuterol  (VENTOLIN  HFA) 108 (90 Base) MCG/ACT inhaler Inhale 2 puffs into the lungs every 6 (six) hours as needed for wheezing or shortness of breath. 18 g 3  . anastrozole  (ARIMIDEX ) 1 MG tablet TAKE 1 TABLET BY MOUTH ONCE DAILY 90 tablet 1  . Calcium  Carb-Cholecalciferol  (CALCIUM  600 + D) 600-200 MG-UNIT TABS Take 1 tablet by mouth daily.     . cetirizine  (ZYRTEC ) 10 MG tablet TAKE 1 TABLET BY  MOUTH DAILY 30 tablet 12  . cholecalciferol  (VITAMIN D3) 25 MCG (1000 UT) tablet Take 1,000 Units by mouth daily.    . Continuous Glucose Receiver (FREESTYLE LIBRE 3 READER) DEVI by Does not apply route.    . Continuous Glucose Sensor (FREESTYLE LIBRE 3 PLUS SENSOR) MISC 1 each by Does not apply route. Change sensor every 15 days.    SABRA CRANBERRY PO Take 2 capsules by mouth daily.    . DULoxetine  (CYMBALTA ) 60 MG capsule TAKE 2 CAPSULES BY MOUTH ONCE DAILY 180 capsule 1  . ELIQUIS  2.5 MG TABS tablet TAKE ONE (1) TABLET BY MOUTH TWO TIMES PER DAY 180 tablet 1  . fluticasone  (FLONASE ) 50 MCG/ACT nasal spray Place 1 spray into both nostrils daily. 16 g 2  . gabapentin  (NEURONTIN ) 400 MG capsule Take 2 capsules (800 mg total) by mouth 3 (three) times daily. 540 capsule 1  . levothyroxine  (SYNTHROID ) 137 MCG tablet Take 1 tablet (137 mcg total) by mouth daily before breakfast. 90 tablet 0  . metoprolol  tartrate (LOPRESSOR ) 25 MG tablet Take 0.5 tablets (12.5 mg total) by mouth 2 (two) times daily.    . montelukast  (SINGULAIR ) 10 MG tablet TAKE 1 TABLET BY MOUTH AT BEDTIME 90 tablet 0  . MYRBETRIQ  50 MG TB24 tablet TAKE 1 TABLET BY MOUTH DAILY 30 tablet 2  . nitroGLYCERIN  (NITROSTAT ) 0.4 MG SL tablet Place 1 tablet (0.4 mg total) under the tongue every 5 (five) minutes as needed for chest pain. Total of 3 doses 25 tablet 6  . ondansetron  (ZOFRAN ) 4 MG tablet Take 1 tablet (4 mg total) by mouth every 8 (eight) hours as needed for nausea or vomiting. 20 tablet 6  . pantoprazole  (PROTONIX ) 40 MG tablet TAKE 1 TABLET BY MOUTH ONCE DAILY 90 tablet 1  . Pumpkin Seed-Soy Germ (AZO BLADDER CONTROL/GO-LESS PO) Take 1 tablet by mouth in the  morning and at bedtime.    . rosuvastatin  (CRESTOR ) 40 MG tablet Take 1 tablet (40 mg total) by mouth daily. 90 tablet 1  . tirzepatide  (MOUNJARO ) 7.5 MG/0.5ML Pen Inject 7.5 mg (0.5 ml) into the skin once a week. 2 mL 1  . TRELEGY ELLIPTA  100-62.5-25 MCG/ACT AEPB INHALE 1 PUFF  INTO LUNGS ONCE DAILY 180 each 0  . ZINC  CITRATE PO Take 1 tablet by mouth daily.     No current facility-administered medications for this visit.      SABRA  PHYSICAL EXAMINATION: ECOG PERFORMANCE STATUS: 1 - Symptomatic but completely ambulatory  There were no vitals filed for this visit.    There were no vitals filed for this visit.     Physical Exam HENT:     Head: Normocephalic and atraumatic.     Mouth/Throat:     Pharynx: No oropharyngeal exudate.  Eyes:     Pupils: Pupils are equal, round, and reactive to light.  Cardiovascular:     Rate and Rhythm: Normal rate and regular rhythm.  Pulmonary:     Effort: Pulmonary effort is normal. No respiratory distress.     Breath sounds: Normal breath sounds. No wheezing.  Abdominal:     General: Bowel sounds are normal. There is no distension.     Palpations: Abdomen is soft. There is no mass.     Tenderness: There is no abdominal tenderness. There is no guarding or rebound.  Musculoskeletal:        General: No tenderness. Normal range of motion.     Cervical back: Normal range of motion and neck supple.  Skin:    General: Skin is warm.  Neurological:     Mental Status: She is alert and oriented to person, place, and time.  Psychiatric:        Mood and Affect: Affect normal.    LABORATORY DATA:  I have reviewed the data as listed Lab Results  Component Value Date   WBC 7.8 11/04/2023   HGB 13.1 11/04/2023   HCT 39.8 11/04/2023   MCV 91.5 11/04/2023   PLT 158 11/04/2023   Recent Labs    04/13/23 1337 04/30/23 1427 05/29/23 0916 10/23/23 1537 11/03/23 0627 11/04/23 0602  NA 142 138   < > 140 136 135  K 5.0 5.0   < > 4.8 4.7 4.6  CL 103 103   < > 104 102 103  CO2 19* 26   < > 21 23 22   GLUCOSE 125* 72   < > 65* 152* 114*  BUN 39* 42*   < > 26 31* 24*  CREATININE 2.17* 1.77*   < > 1.34* 1.46* 1.33*  CALCIUM  9.5 8.8*   < > 9.5 8.8* 7.7*  GFRNONAA  --  29*  --   --  36* 40*  PROT 7.1  --   --  6.1 6.8  --    ALBUMIN 4.4  --   --  3.8 3.2*  --   AST 59*  --   --  23 22  --   ALT 53*  --   --  21 17  --   ALKPHOS 96  --   --  74 56  --   BILITOT 0.5  --   --  0.3 0.9  --    < > = values in this interval not displayed.    RADIOGRAPHIC STUDIES: I have personally reviewed the radiological images as listed and agreed with the findings in  the report. No results found.    ASSESSMENT & PLAN:   No problem-specific Assessment & Plan notes found for this encounter.    All questions were answered. The patient/family knows to call the clinic with any problems, questions or concerns.       Morna Husband, NP 02/04/2024 1:46 PM

## 2024-02-05 ENCOUNTER — Encounter: Payer: Self-pay | Admitting: Nurse Practitioner

## 2024-02-05 ENCOUNTER — Inpatient Hospital Stay: Attending: Family Medicine

## 2024-02-05 ENCOUNTER — Inpatient Hospital Stay

## 2024-02-05 ENCOUNTER — Inpatient Hospital Stay: Admitting: Nurse Practitioner

## 2024-02-05 ENCOUNTER — Inpatient Hospital Stay: Admitting: Internal Medicine

## 2024-02-05 VITALS — BP 105/61 | HR 69 | Temp 99.1°F | Resp 16 | Ht 66.0 in | Wt 173.8 lb

## 2024-02-05 DIAGNOSIS — Z170 Estrogen receptor positive status [ER+]: Secondary | ICD-10-CM | POA: Insufficient documentation

## 2024-02-05 DIAGNOSIS — E559 Vitamin D deficiency, unspecified: Secondary | ICD-10-CM | POA: Diagnosis not present

## 2024-02-05 DIAGNOSIS — Z79811 Long term (current) use of aromatase inhibitors: Secondary | ICD-10-CM | POA: Insufficient documentation

## 2024-02-05 DIAGNOSIS — E1122 Type 2 diabetes mellitus with diabetic chronic kidney disease: Secondary | ICD-10-CM | POA: Insufficient documentation

## 2024-02-05 DIAGNOSIS — Z85118 Personal history of other malignant neoplasm of bronchus and lung: Secondary | ICD-10-CM | POA: Diagnosis not present

## 2024-02-05 DIAGNOSIS — M19012 Primary osteoarthritis, left shoulder: Secondary | ICD-10-CM

## 2024-02-05 DIAGNOSIS — Z08 Encounter for follow-up examination after completed treatment for malignant neoplasm: Secondary | ICD-10-CM | POA: Diagnosis not present

## 2024-02-05 DIAGNOSIS — N951 Menopausal and female climacteric states: Secondary | ICD-10-CM | POA: Insufficient documentation

## 2024-02-05 DIAGNOSIS — C3412 Malignant neoplasm of upper lobe, left bronchus or lung: Secondary | ICD-10-CM

## 2024-02-05 DIAGNOSIS — N184 Chronic kidney disease, stage 4 (severe): Secondary | ICD-10-CM | POA: Diagnosis not present

## 2024-02-05 DIAGNOSIS — Z801 Family history of malignant neoplasm of trachea, bronchus and lung: Secondary | ICD-10-CM | POA: Insufficient documentation

## 2024-02-05 DIAGNOSIS — Z800 Family history of malignant neoplasm of digestive organs: Secondary | ICD-10-CM | POA: Insufficient documentation

## 2024-02-05 DIAGNOSIS — Z87891 Personal history of nicotine dependence: Secondary | ICD-10-CM | POA: Insufficient documentation

## 2024-02-05 DIAGNOSIS — M810 Age-related osteoporosis without current pathological fracture: Secondary | ICD-10-CM

## 2024-02-05 DIAGNOSIS — Z780 Asymptomatic menopausal state: Secondary | ICD-10-CM

## 2024-02-05 DIAGNOSIS — C50312 Malignant neoplasm of lower-inner quadrant of left female breast: Secondary | ICD-10-CM | POA: Diagnosis not present

## 2024-02-05 DIAGNOSIS — Z87440 Personal history of urinary (tract) infections: Secondary | ICD-10-CM | POA: Diagnosis not present

## 2024-02-05 DIAGNOSIS — R918 Other nonspecific abnormal finding of lung field: Secondary | ICD-10-CM | POA: Diagnosis not present

## 2024-02-05 LAB — CBC WITH DIFFERENTIAL (CANCER CENTER ONLY)
Abs Immature Granulocytes: 0.07 K/uL (ref 0.00–0.07)
Basophils Absolute: 0.1 K/uL (ref 0.0–0.1)
Basophils Relative: 0 %
Eosinophils Absolute: 0.4 K/uL (ref 0.0–0.5)
Eosinophils Relative: 3 %
HCT: 44.6 % (ref 36.0–46.0)
Hemoglobin: 14.2 g/dL (ref 12.0–15.0)
Immature Granulocytes: 1 %
Lymphocytes Relative: 21 %
Lymphs Abs: 2.3 K/uL (ref 0.7–4.0)
MCH: 28.9 pg (ref 26.0–34.0)
MCHC: 31.8 g/dL (ref 30.0–36.0)
MCV: 90.7 fL (ref 80.0–100.0)
Monocytes Absolute: 1.3 K/uL — ABNORMAL HIGH (ref 0.1–1.0)
Monocytes Relative: 12 %
Neutro Abs: 7.2 K/uL (ref 1.7–7.7)
Neutrophils Relative %: 63 %
Platelet Count: 229 K/uL (ref 150–400)
RBC: 4.92 MIL/uL (ref 3.87–5.11)
RDW: 13.2 % (ref 11.5–15.5)
WBC Count: 11.4 K/uL — ABNORMAL HIGH (ref 4.0–10.5)
nRBC: 0 % (ref 0.0–0.2)

## 2024-02-05 LAB — BASIC METABOLIC PANEL - CANCER CENTER ONLY
Anion gap: 11 (ref 5–15)
BUN: 24 mg/dL — ABNORMAL HIGH (ref 8–23)
CO2: 23 mmol/L (ref 22–32)
Calcium: 9 mg/dL (ref 8.9–10.3)
Chloride: 104 mmol/L (ref 98–111)
Creatinine: 1.55 mg/dL — ABNORMAL HIGH (ref 0.44–1.00)
GFR, Estimated: 34 mL/min — ABNORMAL LOW (ref >60)
Glucose, Bld: 123 mg/dL — ABNORMAL HIGH (ref 70–99)
Potassium: 4.2 mmol/L (ref 3.5–5.1)
Sodium: 138 mmol/L (ref 135–145)

## 2024-02-05 LAB — VITAMIN D 25 HYDROXY (VIT D DEFICIENCY, FRACTURES): Vit D, 25-Hydroxy: 88.6 ng/mL (ref 30–100)

## 2024-02-05 NOTE — Progress Notes (Signed)
 Pt states she has a UTI, hasn't picked up abx yet. Having a carotid ct tomorrow.   Has she reached for 5 year mark on her breast cancer?

## 2024-02-06 ENCOUNTER — Ambulatory Visit: Attending: Student

## 2024-02-06 ENCOUNTER — Telehealth: Payer: Self-pay | Admitting: Nurse Practitioner

## 2024-02-06 ENCOUNTER — Other Ambulatory Visit: Payer: Self-pay

## 2024-02-06 ENCOUNTER — Encounter: Payer: Self-pay | Admitting: Nurse Practitioner

## 2024-02-06 DIAGNOSIS — I6523 Occlusion and stenosis of bilateral carotid arteries: Secondary | ICD-10-CM | POA: Insufficient documentation

## 2024-02-06 NOTE — Telephone Encounter (Signed)
 Called pt to sched CT - pt confirmed date/time/location - appt reminder sent via mail [facility]

## 2024-02-10 ENCOUNTER — Ambulatory Visit: Payer: Self-pay | Admitting: Student

## 2024-02-12 ENCOUNTER — Telehealth: Payer: Self-pay | Admitting: Internal Medicine

## 2024-02-12 NOTE — Telephone Encounter (Signed)
 Pt called to confirm CT date/time and request appt reminder via mail - [facility]

## 2024-02-13 ENCOUNTER — Ambulatory Visit
Admission: RE | Admit: 2024-02-13 | Discharge: 2024-02-13 | Disposition: A | Source: Ambulatory Visit | Attending: Family Medicine

## 2024-02-13 DIAGNOSIS — Z1231 Encounter for screening mammogram for malignant neoplasm of breast: Secondary | ICD-10-CM | POA: Insufficient documentation

## 2024-02-15 NOTE — Progress Notes (Signed)
 Letter Sent.

## 2024-02-21 ENCOUNTER — Other Ambulatory Visit: Payer: Self-pay | Admitting: Family Medicine

## 2024-02-22 ENCOUNTER — Ambulatory Visit: Payer: Self-pay | Admitting: Nurse Practitioner

## 2024-02-22 NOTE — Progress Notes (Signed)
 Contacted via MyChart   Normal mammogram, may repeat in one year:)

## 2024-02-25 NOTE — Telephone Encounter (Signed)
 Requested medications are due for refill today.  yes  Requested medications are on the active medications list.  yes  Last refill. 11/09/2023 #90 0 rf  Future visit scheduled.   yes  Notes to clinic.  Abnormal labs.    Requested Prescriptions  Pending Prescriptions Disp Refills   SYNTHROID  137 MCG tablet [Pharmacy Med Name: SYNTHROID  137 MCG TAB] 90 tablet 0    Sig: TAKE 1 TABLET EVERY DAY ON EMPTY STOMACHWITH A GLASS OF WATER  AT LEAST 30-60 MINBEFORE BREAKFAST     Endocrinology:  Hypothyroid Agents Failed - 02/25/2024 11:23 AM      Failed - TSH in normal range and within 360 days    TSH  Date Value Ref Range Status  11/03/2023 7.788 (H) 0.350 - 4.500 uIU/mL Final    Comment:    Performed by a 3rd Generation assay with a functional sensitivity of <=0.01 uIU/mL. Performed at [facility], [address] [place], [place] [address]   08/27/2023 55.800 (H) 0.450 - 4.500 uIU/mL Final         Passed - Valid encounter within last 12 months    Recent Outpatient Visits           3 months ago Essential hypertension   [place] [facility] [place], [place] P, DO   3 months ago Acute cystitis with hematuria   [facility] [place], [place] P, DO   4 months ago Type 2 diabetes mellitus with diabetic peripheral angiopathy without gangrene, without long-term current use of insulin  [facility])   [place] [facility] [place], Megan P, DO   5 months ago Essential hypertension   [place] [facility] [place], Megan P, DO   6 months ago Chronic bilateral low back pain without sciatica   [place] [facility] Vicci Duwaine SQUIBB, DO       Future Appointments             In 1 month Wittenborn, Barnie, NP [facility]

## 2024-02-26 ENCOUNTER — Other Ambulatory Visit: Payer: Self-pay | Admitting: Family Medicine

## 2024-02-28 ENCOUNTER — Encounter: Payer: Self-pay | Admitting: Family Medicine

## 2024-02-28 ENCOUNTER — Ambulatory Visit: Admitting: Family Medicine

## 2024-02-28 ENCOUNTER — Telehealth: Payer: Self-pay | Admitting: Family Medicine

## 2024-02-28 ENCOUNTER — Telehealth: Admitting: Family Medicine

## 2024-02-28 ENCOUNTER — Ambulatory Visit: Payer: Self-pay

## 2024-02-28 DIAGNOSIS — R519 Headache, unspecified: Secondary | ICD-10-CM

## 2024-02-28 DIAGNOSIS — G44019 Episodic cluster headache, not intractable: Secondary | ICD-10-CM | POA: Diagnosis not present

## 2024-02-28 NOTE — Telephone Encounter (Signed)
 She is not able to get a BP etc- in person appointment is more appropriate. I can see both her and her husband on Christmas Eve if they'd prefer to come in

## 2024-02-28 NOTE — Progress Notes (Signed)
 There were no vitals taken for this visit.   Subjective:    Patient ID: Tammy [name], female    DOB: 11/24/1945, 78 y.o.   MRN: [id number]  HPI: Tammy [name] is a 78 y.o. female  Chief Complaint  Patient presents with   Headache   Has had headaches for 2-3 weeks with pain over her R eye. Has visual changes in her eye. Has been following with ophthalmology. She had a fall recently and had a black eye. She says she was seen after that. She has also been having a lot of pain in her back. She has seen specialists and unfortunately there is nothing else they can do for the back pain. Her husband just got home from the hospital and is due to start dialysis tomorrow. No other concerns or complaints at this time.   Relevant past medical, surgical, family and social history reviewed and updated as indicated. Interim medical history since our last visit reviewed. Allergies and medications reviewed and updated.  Review of Systems  Constitutional: Negative.   HENT: Negative.    Respiratory: Negative.    Cardiovascular: Negative.   Gastrointestinal:  Positive for diarrhea. Negative for abdominal distention, abdominal pain, anal bleeding, blood in stool, constipation, nausea, rectal pain and vomiting.  Musculoskeletal:  Positive for back pain, gait problem and myalgias. Negative for arthralgias, joint swelling, neck pain and neck stiffness.  Skin: Negative.   Neurological:  Positive for headaches. Negative for dizziness, tremors, seizures, syncope, facial asymmetry, speech difficulty, weakness, light-headedness and numbness.  Psychiatric/Behavioral: Negative.      Per HPI unless specifically indicated above     Objective:    There were no vitals taken for this visit.  Wt Readings from Last 3 Encounters:  02/05/24 173 lb 12.8 oz (78.8 kg)  12/25/23 179 lb (81.2 kg)  11/26/23 179 lb 12.8 oz (81.6 kg)    Physical Exam Vitals and nursing note reviewed.  Constitutional:      General: She is  not in acute distress.    Appearance: Normal appearance. She is not ill-appearing, toxic-appearing or diaphoretic.  HENT:     Head: Normocephalic and atraumatic.     Right Ear: External ear normal.     Left Ear: External ear normal.     Nose: Nose normal.     Mouth/Throat:     Mouth: Mucous membranes are moist.     Pharynx: Oropharynx is clear.  Eyes:     General: No scleral icterus.       Right eye: No discharge.        Left eye: No discharge.     Conjunctiva/sclera: Conjunctivae normal.     Pupils: Pupils are equal, round, and reactive to light.  Pulmonary:     Effort: Pulmonary effort is normal. No respiratory distress.     Comments: Speaking in full sentences Musculoskeletal:        General: Normal range of motion.     Cervical back: Normal range of motion.  Skin:    Coloration: Skin is not jaundiced or pale.     Findings: No bruising, erythema, lesion or rash.  Neurological:     Mental Status: She is alert and oriented to person, place, and time. Mental status is at baseline.  Psychiatric:        Mood and Affect: Mood normal.        Behavior: Behavior normal.        Thought Content: Thought content normal.  Judgment: Judgment normal.     Results for orders placed or performed in visit on 02/05/24  VITAMIN D  25 Hydroxy (Vit-D Deficiency, Fractures)   Collection Time: 02/05/24  1:49 PM  Result Value Ref Range   Vit D, 25-Hydroxy 88.60 30 - 100 ng/mL  CBC with Differential (Cancer Center Only)   Collection Time: 02/05/24  1:49 PM  Result Value Ref Range   WBC Count 11.4 (H) 4.0 - 10.5 K/uL   RBC 4.92 3.87 - 5.11 MIL/uL   Hemoglobin 14.2 12.0 - 15.0 g/dL   HCT 55.3 63.9 - 53.9 %   MCV 90.7 80.0 - 100.0 fL   MCH 28.9 26.0 - 34.0 pg   MCHC 31.8 30.0 - 36.0 g/dL   RDW 86.7 88.4 - 84.4 %   Platelet Count 229 150 - 400 K/uL   nRBC 0.0 0.0 - 0.2 %   Neutrophils Relative % 63 %   Neutro Abs 7.2 1.7 - 7.7 K/uL   Lymphocytes Relative 21 %   Lymphs Abs 2.3 0.7 -  4.0 K/uL   Monocytes Relative 12 %   Monocytes Absolute 1.3 (H) 0.1 - 1.0 K/uL   Eosinophils Relative 3 %   Eosinophils Absolute 0.4 0.0 - 0.5 K/uL   Basophils Relative 0 %   Basophils Absolute 0.1 0.0 - 0.1 K/uL   Immature Granulocytes 1 %   Abs Immature Granulocytes 0.07 0.00 - 0.07 K/uL  Basic Metabolic Panel - Cancer Center Only   Collection Time: 02/05/24  1:49 PM  Result Value Ref Range   Sodium 138 135 - 145 mmol/L   Potassium 4.2 3.5 - 5.1 mmol/L   Chloride 104 98 - 111 mmol/L   CO2 23 22 - 32 mmol/L   Glucose, Bld 123 (H) 70 - 99 mg/dL   BUN 24 (H) 8 - 23 mg/dL   Creatinine 8.44 (H) 9.55 - 1.00 mg/dL   Calcium  9.0 8.9 - 10.3 mg/dL   GFR, Estimated 34 (L) >60 mL/min   Anion gap 11 5 - 15   *Note: Due to a large number of results and/or encounters for the requested time period, some results have not been displayed. A complete set of results can be found in Results Review.      Assessment & Plan:   Problem List Items Addressed This Visit   None Visit Diagnoses       Nonintractable episodic headache, unspecified headache type    -  Primary   Discussed the limitations of virtual appointment in evaluation of headache- she will come in next week for in person appointment with labs and vitals.        Follow up plan: Return for Wednesday 12/24 9:20 and 9:40 for her husband in person.    This visit was completed via video visit through MyChart due to the restrictions of the COVID-19 pandemic. All issues as above were discussed and addressed. Physical exam was done as above through visual confirmation on video through MyChart. If it was felt that the patient should be evaluated in the office, they were directed there. The patient verbally consented to this visit. Location of the patient: home Location of the provider: work Those involved with this call:  Provider: Duwaine Louder, DO CMA: York Fogo, CMA, Front Desk/Registration: Claretta Maiden  Time spent on call: 15  minutes with patient face to face via video conference. More than 50% of this time was spent in counseling and coordination of care. 23 minutes total spent in review  of patient's record and preparation of their chart.

## 2024-02-28 NOTE — Telephone Encounter (Signed)
 FYI Only or Action Required?: FYI only for provider: appointment scheduled on 02/28/24.  Patient was last seen in primary care on 11/26/2023 by Vicci Duwaine SQUIBB, DO.  Called Nurse Triage reporting Headache.  Symptoms began several days ago.  Interventions attempted: OTC medications: tylenol , gabapentin .  Symptoms are: unchanged.  Triage Disposition: See PCP When Office is Open (Within 3 Days)  Patient/caregiver understands and will follow disposition?: Yes  Copied from CRM #[id number]. Topic: Clinical - Red Word Triage >> Feb 28, 2024 11:51 AM Tammy [name] wrote: Tammy [name] that prompted transfer to Nurse Triage: The patient's leg, back, and stomach are causing her a lot of pain and the pain is preventing her from being able to walk.  She is wanting to reschedule her appointment for today at 2:40. Reason for Disposition  [1] MILD-MODERATE headache AND [2] present > 3 days (72 hours) AND [3] no improvement after using Care Advice  Answer Assessment - Initial Assessment Questions PT had an OV today 02/28/24.  Husband Dc'd from hospital last night on 02/27/24 and pt is home taking care of her husband so will not be able to come in for appt. Pt's appt changed to virtual.   Pt complaining of diarrhea and headache. Diarrhea start today 02/28/24, headache started 3-4 days ago. Taking tylenol  morning and night, and taking gabapentin  helps.  1. LOCATION: Where does it hurt?      Head  2. ONSET: When did the headache start? (e.g., minutes, hours, days)      3-4 days ago 3. PATTERN: Does the pain come and go, or has it been constant since it started?     Constant nagging headache not severe 4. SEVERITY: How bad is the pain? and What does it keep you from doing?  (e.g., Scale 1-10; mild, moderate, or severe)     5/10 5. RECURRENT SYMPTOM: Have you ever had headaches before? If Yes, ask: When was the last time? and What happened that time?      Denies  6. CAUSE: What do you think  is causing the headache?     Does not know  7. MIGRAINE: Have you been diagnosed with migraine headaches? If Yes, ask: Is this headache similar?      Yes  8. HEAD INJURY: Has there been any recent injury to your head?      Head injury a month or so ago 9. OTHER SYMPTOMS: Do you have any other symptoms? (e.g., fever, stiff neck, eye pain, sore throat, cold symptoms)     Stomach pain, diarrhea, leg pain - appt dec 26th for this.  Protocols used: [facility]

## 2024-02-28 NOTE — Telephone Encounter (Signed)
 Copied from CRM #[id number]. Topic: Clinical - Medication Question >> Feb 28, 2024 11:52 AM China J wrote: Reason for CRM: The patient wanted to ask about the status of her medication (levothyroxine ) I let her know that we received the request 3 days ago and today would be right at the maximum turn around date. She would like a call once the medication has been approved.  She would also like Dr. Vicci to know that she has been taking 125mg  of levothyroxine  that she had lying around because she thought it would be better to take something rather than nothing at all.

## 2024-02-29 NOTE — Telephone Encounter (Signed)
 Rx 11/26/23 for both- 6 month supply- too soon Requested Prescriptions  Pending Prescriptions Disp Refills   rosuvastatin  (CRESTOR ) 40 MG tablet [Pharmacy Med Name: ROSUVASTATIN  CALCIUM  40 MG TAB] 90 tablet 1    Sig: TAKE 1 TABLET BY MOUTH ONCE DAILY     Cardiovascular:  Antilipid - Statins 2 Failed - 02/29/2024 11:06 AM      Failed - Cr in normal range and within 360 days    Creatinine  Date Value Ref Range Status  02/05/2024 1.55 (H) 0.44 - 1.00 mg/dL Final  92/89/7986 8.68 (H) 0.60 - 1.30 mg/dL Final         Failed - Lipid Panel in normal range within the last 12 months    Cholesterol, Total  Date Value Ref Range Status  04/13/2023 178 100 - 199 mg/dL Final   Cholesterol Piccolo, Waived  Date Value Ref Range Status  01/31/2016 152 <200 mg/dL Final    Comment:                            Desirable                <200                         Borderline High      200- 239                         High                     >239    LDL Chol Calc (NIH)  Date Value Ref Range Status  04/13/2023 76 0 - 99 mg/dL Final   HDL  Date Value Ref Range Status  04/13/2023 61 >39 mg/dL Final   Triglycerides  Date Value Ref Range Status  04/13/2023 252 (H) 0 - 149 mg/dL Final   Triglycerides Piccolo,Waived  Date Value Ref Range Status  01/31/2016 224 (H) <150 mg/dL Final    Comment:                            Normal                   <150                         Borderline High     150 - 199                         High                200 - 499                         Very High                >499          Passed - Patient is not pregnant      Passed - Valid encounter within last 12 months    Recent Outpatient Visits           Yesterday Nonintractable episodic headache, unspecified headache type   [place] [place], Megan P, DO   3 months ago Essential hypertension   [place] [facility] [place], Megan P, DO   3 months ago Acute  cystitis with hematuria   [place] [facility] [place], [place] P, DO   4 months ago Type 2 diabetes mellitus with diabetic peripheral angiopathy without gangrene, without long-term current use of insulin  (HCC)   [facility] [place], Megan P, DO   5 months ago Essential hypertension   [place] [facility] [place], [place], DO       Future Appointments             In 4 weeks Wittenborn, Barnie, NP [facility]             gabapentin  (NEURONTIN ) 400 MG capsule [Pharmacy Med Name: GABAPENTIN  400 MG CAP] 540 capsule 1    Sig: TAKE 2 CAPSULES BY MOUTH 3 TIMES DAILY     Neurology: Anticonvulsants - gabapentin  Failed - 02/29/2024 11:06 AM      Failed - Cr in normal range and within 360 days    Creatinine  Date Value Ref Range Status  02/05/2024 1.55 (H) 0.44 - 1.00 mg/dL Final  92/89/7986 8.68 (H) 0.60 - 1.30 mg/dL Final         Passed - Completed PHQ-2 or PHQ-9 in the last 360 days      Passed - Valid encounter within last 12 months    Recent Outpatient Visits           Yesterday Nonintractable episodic headache, unspecified headache type   [facility] [place], Megan P, DO   3 months ago Essential hypertension   [place] [facility] [place], [place] P, DO   3 months ago Acute cystitis with hematuria   [facility] [place], Megan P, DO   4 months ago Type 2 diabetes mellitus with diabetic peripheral angiopathy without gangrene, without long-term current use of insulin  [facility])    [facility] [place], Megan P, DO   5 months ago Essential hypertension    [facility] [place], Duwaine SQUIBB, DO       Future Appointments             In 4 weeks Wittenborn, Barnie, NP [facility]

## 2024-03-04 ENCOUNTER — Encounter: Payer: Self-pay | Admitting: Family Medicine

## 2024-03-04 ENCOUNTER — Other Ambulatory Visit (INDEPENDENT_AMBULATORY_CARE_PROVIDER_SITE_OTHER): Payer: Self-pay | Admitting: Nurse Practitioner

## 2024-03-04 DIAGNOSIS — Z9889 Other specified postprocedural states: Secondary | ICD-10-CM

## 2024-03-04 LAB — HM DIABETES EYE EXAM

## 2024-03-05 ENCOUNTER — Ambulatory Visit: Payer: Self-pay | Admitting: Family Medicine

## 2024-03-05 ENCOUNTER — Encounter: Payer: Self-pay | Admitting: Family Medicine

## 2024-03-05 ENCOUNTER — Other Ambulatory Visit: Payer: Self-pay | Admitting: Family Medicine

## 2024-03-05 ENCOUNTER — Ambulatory Visit: Admitting: Family Medicine

## 2024-03-05 VITALS — BP 125/84 | HR 69 | Temp 97.8°F | Ht 66.0 in

## 2024-03-05 DIAGNOSIS — E039 Hypothyroidism, unspecified: Secondary | ICD-10-CM | POA: Diagnosis not present

## 2024-03-05 DIAGNOSIS — E1122 Type 2 diabetes mellitus with diabetic chronic kidney disease: Secondary | ICD-10-CM

## 2024-03-05 DIAGNOSIS — R079 Chest pain, unspecified: Secondary | ICD-10-CM | POA: Diagnosis not present

## 2024-03-05 DIAGNOSIS — R300 Dysuria: Secondary | ICD-10-CM | POA: Diagnosis not present

## 2024-03-05 DIAGNOSIS — I10 Essential (primary) hypertension: Secondary | ICD-10-CM | POA: Diagnosis not present

## 2024-03-05 DIAGNOSIS — J449 Chronic obstructive pulmonary disease, unspecified: Secondary | ICD-10-CM

## 2024-03-05 DIAGNOSIS — N1832 Chronic kidney disease, stage 3b: Secondary | ICD-10-CM | POA: Diagnosis not present

## 2024-03-05 DIAGNOSIS — E559 Vitamin D deficiency, unspecified: Secondary | ICD-10-CM | POA: Diagnosis not present

## 2024-03-05 DIAGNOSIS — E782 Mixed hyperlipidemia: Secondary | ICD-10-CM | POA: Diagnosis not present

## 2024-03-05 DIAGNOSIS — R519 Headache, unspecified: Secondary | ICD-10-CM | POA: Diagnosis not present

## 2024-03-05 DIAGNOSIS — N3001 Acute cystitis with hematuria: Secondary | ICD-10-CM | POA: Diagnosis not present

## 2024-03-05 LAB — URINALYSIS, ROUTINE W REFLEX MICROSCOPIC
Bilirubin, UA: NEGATIVE
Glucose, UA: NEGATIVE
Ketones, UA: NEGATIVE
Nitrite, UA: POSITIVE — AB
Specific Gravity, UA: 1.025 (ref 1.005–1.030)
Urobilinogen, Ur: 0.2 mg/dL (ref 0.2–1.0)
pH, UA: 5.5 (ref 5.0–7.5)

## 2024-03-05 LAB — MICROSCOPIC EXAMINATION

## 2024-03-05 LAB — MICROALBUMIN, URINE WAIVED
Creatinine, Urine Waived: 300 mg/dL (ref 10–300)
Microalb, Ur Waived: 150 mg/L — ABNORMAL HIGH (ref 0–19)

## 2024-03-05 LAB — POC COVID19 BINAXNOW: SARS Coronavirus 2 Ag: NEGATIVE

## 2024-03-05 LAB — BAYER DCA HB A1C WAIVED: HB A1C (BAYER DCA - WAIVED): 6.5 % — ABNORMAL HIGH (ref 4.8–5.6)

## 2024-03-05 LAB — VERITOR FLU A/B WAIVED
Influenza A: NEGATIVE
Influenza B: NEGATIVE

## 2024-03-05 MED ORDER — KETOROLAC TROMETHAMINE 60 MG/2ML IM SOLN
60.0000 mg | Freq: Once | INTRAMUSCULAR | Status: AC
  Administered 2024-03-05: 60 mg via INTRAMUSCULAR

## 2024-03-05 MED ORDER — CIPROFLOXACIN HCL 500 MG PO TABS: 500.0000 mg | ORAL_TABLET | Freq: Two times a day (BID) | ORAL | 0 refills | Status: DC

## 2024-03-05 NOTE — Progress Notes (Signed)
 "  BP 125/84   Pulse 69   Temp 97.8 F (36.6 C) (Oral)   Ht 5' 6 (1.676 m)   SpO2 96%   BMI 28.05 kg/m    Subjective:    Patient ID: Tammy [name], female    DOB: 1945-07-14, 78 y.o.   MRN: [id number]  HPI: Tammy [name] is a 78 y.o. female  Chief Complaint  Patient presents with   Hypertension   Diabetes    Bgl @ home 120's and under   CHEST PAIN Duration:couple of days Onset: sudden Quality: sharp, aching, sore Severity: severe Location: upper sternum Radiation: none Episode duration: off and on with movement Frequency: intermittent Related to exertion: no Activity when pain started:  Trauma: no Anxiety/recent stressors: yes Aggravating factors:  Alleviating factors:  Status: fluctuating Treatments attempted: nothing  Current pain status: in pain Shortness of breath: yes Cough: no Nausea: no Diaphoresis: no Heartburn: no Palpitations: no  URINARY SYMPTOMS Duration: about a week Dysuria: yes Urinary frequency: yes Urgency: yes Small volume voids: yes Symptom severity: severe Urinary incontinence: no Foul odor: yes Hematuria: no Abdominal pain: yes Back pain: yes Suprapubic pain/pressure: yes Flank pain: yes Fever:  no Vomiting: no Relief with cranberry juice: no Relief with pyridium: no Status: worse Previous urinary tract infection: yes Recurrent urinary tract infection: no Sexual activity: No sexually active History of sexually transmitted disease: no Vaginal discharge: no Treatments attempted: increasing fluids   DIABETES Hypoglycemic episodes:no Polydipsia/polyuria: yes Visual disturbance: no Chest pain: no Paresthesias: no Glucose Monitoring: yes  Accucheck frequency: continuous Taking Insulin ?: no Blood Pressure Monitoring: rarely Retinal Examination: Up to Date Foot Exam: Up to Date Diabetic Education: Completed Pneumovax: Up to Date Influenza: Up to Date Aspirin : no  HYPERTENSION / HYPERLIPIDEMIA Satisfied with current  treatment? yes Duration of hypertension: chronic BP monitoring frequency: not checking BP medication side effects: no Duration of hyperlipidemia: chronic Cholesterol medication side effects: no Cholesterol supplements: none Past cholesterol medications: crestor  Medication compliance: excellent compliance Aspirin : no Recent stressors: yes Recurrent headaches: yes Visual changes: no Palpitations: no Dyspnea: no Chest pain: no Lower extremity edema: no Dizzy/lightheaded: no  HYPOTHYROIDISM Thyroid  control status:uncontrolled Satisfied with current treatment? no Medication side effects: no Medication compliance: fair compliance Recent dose adjustment:yes Fatigue: yes Cold intolerance: no Heat intolerance: no Weight gain: no Weight loss: no Constipation: no Diarrhea/loose stools: no Palpitations: no Lower extremity edema: no Anxiety/depressed mood: yes  Relevant past medical, surgical, family and social history reviewed and updated as indicated. Interim medical history since our last visit reviewed. Allergies and medications reviewed and updated.  Review of Systems  Constitutional:  Positive for fatigue. Negative for activity change, appetite change, chills, diaphoresis, fever and unexpected weight change.  HENT: Negative.    Respiratory:  Positive for shortness of breath. Negative for apnea, cough, choking, chest tightness, wheezing and stridor.   Cardiovascular:  Positive for chest pain. Negative for palpitations and leg swelling.  Gastrointestinal:  Positive for abdominal pain. Negative for abdominal distention, anal bleeding, blood in stool, constipation, diarrhea, nausea, rectal pain and vomiting.  Genitourinary:  Positive for dysuria, flank pain, frequency and urgency. Negative for decreased urine volume, difficulty urinating, dyspareunia, enuresis, genital sores, hematuria, menstrual problem, pelvic pain, vaginal bleeding, vaginal discharge and vaginal pain.   Musculoskeletal:  Positive for back pain and myalgias. Negative for arthralgias, gait problem, joint swelling, neck pain and neck stiffness.  Skin: Negative.   Neurological: Negative.   Psychiatric/Behavioral:  Positive for dysphoric mood. Negative for  agitation, behavioral problems, confusion, decreased concentration, hallucinations, self-injury, sleep disturbance and suicidal ideas. The patient is nervous/anxious. The patient is not hyperactive.     Per HPI unless specifically indicated above     Objective:    BP 125/84   Pulse 69   Temp 97.8 F (36.6 C) (Oral)   Ht 5' 6 (1.676 m)   SpO2 96%   BMI 28.05 kg/m   Wt Readings from Last 3 Encounters:  02/05/24 173 lb 12.8 oz (78.8 kg)  12/25/23 179 lb (81.2 kg)  11/26/23 179 lb 12.8 oz (81.6 kg)    Physical Exam Vitals and nursing note reviewed.  Constitutional:      General: She is not in acute distress.    Appearance: Normal appearance. She is obese. She is ill-appearing. She is not toxic-appearing or diaphoretic.  HENT:     Head: Normocephalic and atraumatic.     Right Ear: External ear normal.     Left Ear: External ear normal.     Nose: Nose normal.     Mouth/Throat:     Mouth: Mucous membranes are moist.     Pharynx: Oropharynx is clear.  Eyes:     General: No scleral icterus.       Right eye: No discharge.        Left eye: No discharge.     Extraocular Movements: Extraocular movements intact.     Conjunctiva/sclera: Conjunctivae normal.     Pupils: Pupils are equal, round, and reactive to light.  Cardiovascular:     Rate and Rhythm: Normal rate and regular rhythm.     Pulses: Normal pulses.     Heart sounds: Normal heart sounds. No murmur heard.    No friction rub. No gallop.  Pulmonary:     Effort: Pulmonary effort is normal. No respiratory distress.     Breath sounds: Normal breath sounds. No stridor. No wheezing, rhonchi or rales.  Chest:     Chest wall: No tenderness.  Musculoskeletal:        General:  Normal range of motion.     Cervical back: Normal range of motion and neck supple.  Skin:    General: Skin is warm and dry.     Capillary Refill: Capillary refill takes less than 2 seconds.     Coloration: Skin is not jaundiced or pale.     Findings: No bruising, erythema, lesion or rash.  Neurological:     General: No focal deficit present.     Mental Status: She is alert and oriented to person, place, and time. Mental status is at baseline.  Psychiatric:        Mood and Affect: Mood normal. Affect is tearful.        Behavior: Behavior normal.        Thought Content: Thought content normal.        Judgment: Judgment normal.     Results for orders placed or performed in visit on 03/05/24  Microscopic Examination   Collection Time: 03/05/24  9:48 AM   BLD  Result Value Ref Range   WBC, UA 6-10 (A) 0 - 5 /hpf   RBC, Urine 0-2 0 - 2 /hpf   Epithelial Cells (non renal) 0-10 0 - 10 /hpf   Bacteria, UA Many (A) None seen/Few  Bayer DCA Hb A1c Waived   Collection Time: 03/05/24  9:48 AM  Result Value Ref Range   HB A1C (BAYER DCA - WAIVED) 6.5 (H) 4.8 - 5.6 %  Microalbumin, Urine Waived   Collection Time: 03/05/24  9:48 AM  Result Value Ref Range   Microalb, Ur Waived 150 (H) 0 - 19 mg/L   Creatinine, Urine Waived 300 10 - 300 mg/dL   Microalb/Creat Ratio 30-300 (H) <30 mg/g  Urinalysis, Routine w reflex microscopic   Collection Time: 03/05/24  9:48 AM  Result Value Ref Range   Specific Gravity, UA 1.025 1.005 - 1.030   pH, UA 5.5 5.0 - 7.5   Color, UA Yellow Yellow   Appearance Ur Clear Clear   Leukocytes,UA 1+ (A) Negative   Protein,UA 1+ (A) Negative/Trace   Glucose, UA Negative Negative   Ketones, UA Negative Negative   RBC, UA Trace (A) Negative   Bilirubin, UA Negative Negative   Urobilinogen, Ur 0.2 0.2 - 1.0 mg/dL   Nitrite, UA Positive (A) Negative   Microscopic Examination See below:   POC COVID-19   Collection Time: 03/05/24 11:34 AM  Result Value Ref Range    SARS Coronavirus 2 Ag Negative Negative   *Note: Due to a large number of results and/or encounters for the requested time period, some results have not been displayed. A complete set of results can be found in Results Review.      Assessment & Plan:   Problem List Items Addressed This Visit       Cardiovascular and Mediastinum   Essential hypertension (Chronic)   Under good control on current regimen. Continue current regimen. Continue to monitor. Call with any concerns. Refills given. Labs drawn today.         Respiratory   COPD (chronic obstructive pulmonary disease) (HCC) (Chronic)   Lungs clear. Call with any concerns.         Endocrine   Type 2 diabetes mellitus with diabetic chronic kidney disease (HCC) (Chronic)   A1c up slightly at 6.5. Continue current regimen. Continue to monitor. Call with any concerns.       Relevant Orders   Lipid Panel w/o Chol/HDL Ratio   CBC with Differential/Platelet   Bayer DCA Hb A1c Waived (Completed)   Comprehensive metabolic panel with GFR   Microalbumin, Urine Waived (Completed)   Hypothyroid   Rechecking labs today. Await results. Treat as needed.       Relevant Orders   TSH     Genitourinary   Stage 3b chronic kidney disease (HCC) (Chronic)   Rechecking labs today. Await results. Treat as needed.         Other   Hyperlipidemia   Under good control on current regimen. Continue current regimen. Continue to monitor. Call with any concerns. Refills given. Labs drawn today.        Morbid obesity due to excess calories (HCC)   Encouraged diet and exercise with goal of losing 1-2lbs per week. Call with any concerns.       Vitamin D  deficiency   Rechecking labs today. Await results. Treat as needed.       Other Visit Diagnoses       Acute intractable headache, unspecified headache type    -  Primary   Will treat with toradol  shot and check labs. Await results.   Relevant Medications   ketorolac  (TORADOL )  injection 60 mg   Other Relevant Orders   Veritor Flu A/B Waived   POC COVID-19 (Completed)     Chest pain, unspecified type       EKG shows no change from prior- warning signs to go to ER discussed today.  Relevant Orders   EKG 12-Lead (Completed)     Acute cystitis with hematuria       Will treat with cipro  and send for culture. Continue to monitor closely.   Relevant Orders   Urine Culture     Dysuria       + UTI.   Relevant Orders   Urinalysis, Routine w reflex microscopic (Completed)        Follow up plan: Return in about 3 weeks (around 03/26/2024).      "

## 2024-03-05 NOTE — Assessment & Plan Note (Signed)
 Lungs clear. Call with any concerns.

## 2024-03-05 NOTE — Assessment & Plan Note (Signed)
 Encouraged diet and exercise with goal of losing 1-2lbs per week. Call with any concerns.

## 2024-03-05 NOTE — Assessment & Plan Note (Signed)
 Under good control on current regimen. Continue current regimen. Continue to monitor. Call with any concerns. Refills given. Labs drawn today.

## 2024-03-05 NOTE — Assessment & Plan Note (Signed)
 Rechecking labs today. Await results. Treat as needed.

## 2024-03-05 NOTE — Assessment & Plan Note (Signed)
 A1c up slightly at 6.5. Continue current regimen. Continue to monitor. Call with any concerns.

## 2024-03-06 LAB — LIPID PANEL W/O CHOL/HDL RATIO
Cholesterol, Total: 123 mg/dL (ref 100–199)
HDL: 46 mg/dL
LDL Chol Calc (NIH): 48 mg/dL (ref 0–99)
Triglycerides: 176 mg/dL — ABNORMAL HIGH (ref 0–149)
VLDL Cholesterol Cal: 29 mg/dL (ref 5–40)

## 2024-03-06 LAB — CBC WITH DIFFERENTIAL/PLATELET
Basophils Absolute: 0.1 x10E3/uL (ref 0.0–0.2)
Basos: 1 %
EOS (ABSOLUTE): 0.2 x10E3/uL (ref 0.0–0.4)
Eos: 2 %
Hematocrit: 43.2 % (ref 34.0–46.6)
Hemoglobin: 13.9 g/dL (ref 11.1–15.9)
Immature Grans (Abs): 0 x10E3/uL (ref 0.0–0.1)
Immature Granulocytes: 0 %
Lymphocytes Absolute: 2.7 x10E3/uL (ref 0.7–3.1)
Lymphs: 29 %
MCH: 29.4 pg (ref 26.6–33.0)
MCHC: 32.2 g/dL (ref 31.5–35.7)
MCV: 92 fL (ref 79–97)
Monocytes Absolute: 0.8 x10E3/uL (ref 0.1–0.9)
Monocytes: 9 %
Neutrophils Absolute: 5.4 x10E3/uL (ref 1.4–7.0)
Neutrophils: 59 %
Platelets: 254 x10E3/uL (ref 150–450)
RBC: 4.72 x10E6/uL (ref 3.77–5.28)
RDW: 13.2 % (ref 11.7–15.4)
WBC: 9.2 x10E3/uL (ref 3.4–10.8)

## 2024-03-06 LAB — COMPREHENSIVE METABOLIC PANEL WITH GFR
ALT: 12 IU/L (ref 0–32)
AST: 18 IU/L (ref 0–40)
Albumin: 4.1 g/dL (ref 3.8–4.8)
Alkaline Phosphatase: 73 IU/L (ref 49–135)
BUN/Creatinine Ratio: 21 (ref 12–28)
BUN: 26 mg/dL (ref 8–27)
Bilirubin Total: 0.4 mg/dL (ref 0.0–1.2)
CO2: 21 mmol/L (ref 20–29)
Calcium: 9.9 mg/dL (ref 8.7–10.3)
Chloride: 107 mmol/L — ABNORMAL HIGH (ref 96–106)
Creatinine, Ser: 1.25 mg/dL — ABNORMAL HIGH (ref 0.57–1.00)
Globulin, Total: 2.5 g/dL (ref 1.5–4.5)
Glucose: 157 mg/dL — ABNORMAL HIGH (ref 70–99)
Potassium: 4.6 mmol/L (ref 3.5–5.2)
Sodium: 145 mmol/L — ABNORMAL HIGH (ref 134–144)
Total Protein: 6.6 g/dL (ref 6.0–8.5)
eGFR: 44 mL/min/1.73 — ABNORMAL LOW

## 2024-03-06 LAB — TSH: TSH: 0.164 u[IU]/mL — ABNORMAL LOW (ref 0.450–4.500)

## 2024-03-07 LAB — URINE CULTURE

## 2024-03-07 MED ORDER — LEVOTHYROXINE SODIUM 125 MCG PO TABS: 125.0000 ug | ORAL_TABLET | Freq: Every day | ORAL | 0 refills | Status: AC

## 2024-03-11 ENCOUNTER — Encounter (INDEPENDENT_AMBULATORY_CARE_PROVIDER_SITE_OTHER): Payer: Self-pay | Admitting: Nurse Practitioner

## 2024-03-11 ENCOUNTER — Ambulatory Visit (INDEPENDENT_AMBULATORY_CARE_PROVIDER_SITE_OTHER): Admitting: Nurse Practitioner

## 2024-03-11 ENCOUNTER — Encounter (INDEPENDENT_AMBULATORY_CARE_PROVIDER_SITE_OTHER): Payer: Self-pay

## 2024-03-11 ENCOUNTER — Ambulatory Visit (INDEPENDENT_AMBULATORY_CARE_PROVIDER_SITE_OTHER)

## 2024-03-11 DIAGNOSIS — I739 Peripheral vascular disease, unspecified: Secondary | ICD-10-CM

## 2024-03-11 DIAGNOSIS — Z9889 Other specified postprocedural states: Secondary | ICD-10-CM | POA: Diagnosis not present

## 2024-03-12 LAB — VAS US ABI WITH/WO TBI
Left ABI: 1.13
Right ABI: 1.29

## 2024-03-17 ENCOUNTER — Ambulatory Visit: Payer: Self-pay

## 2024-03-17 NOTE — Telephone Encounter (Signed)
 FYI Only or Action Required?: Action required by provider: request for appointment.  Patient was last seen in primary care on 03/05/2024 by Vicci Duwaine SQUIBB, DO.  Called Nurse Triage reporting urinary symptoms.  Symptoms began several weeks ago.  Interventions attempted: Prescription medications: Cipro .  Symptoms are: gradually worsening. Offered appointments for this week, declines.I'll just wait until next week to see her. Already has appointment next week.  Triage Disposition: See Physician Within 24 Hours  Patient/caregiver understands and will follow disposition?: No, wishes to speak with PCP     Copied from CRM #[id number]. Topic: Clinical - Red Word Triage >> Mar 17, 2024  9:34 AM Winona R wrote: Had a UTI a week ago, took medication for it, but doesnt think the uti was taken care of as she is unable to control her bladder. When ever she drinks anything it comes right out of her immediately, Pt is in pain she can barely move around, nauseous, cold, urine has a foul odor, frequent urination. Reason for Disposition  Urinating more frequently than usual (i.e., frequency) OR new-onset of the feeling of an urgent need to urinate (i.e., urgency)  Answer Assessment - Initial Assessment Questions 1. SYMPTOM: What's the main symptom you're concerned about? (e.g., frequency, incontinence)     Frequency, odor to urine 2. ONSET: When did the    start?     2 weeks 3. PAIN: Is there any pain? If Yes, ask: How bad is it? (Scale: 1-10; mild, moderate, severe)     SEVERE 4. CAUSE: What do you think is causing the symptoms?     UTI 5. OTHER SYMPTOMS: Do you have any other symptoms? (e.g., blood in urine, fever, flank pain, pain with urination)     Nausea, chills 6. PREGNANCY: Is there any chance you are pregnant? When was your last menstrual period?     NO  Protocols used: Urinary Symptoms-A-AH

## 2024-03-18 NOTE — Progress Notes (Signed)
 Co-signed by Dr. Mardee  History of Present Illness: History of Present Illness Tammy [name] is an 79 year old female with right total knee arthroplasty who presents with persistent right knee pain and mechanical symptoms since surgery.  She underwent right total knee arthroplasty in 2023 and has had constant right knee pain ever since. Pain is persistent with intermittent severe shooting episodes. Mechanical symptoms include a sensation of the knee subluxing and then reducing with an audible pop, followed by abrupt pain relief. These episodes occur mainly while sitting with the right leg tucked under the other, and she avoids ambulation during them.  She has tenderness over the anterior and posterior right knee with pain radiating down the leg. There is focal bone pain along the tibia and patella that is painful to palpation. She reports persistent posterior knee pain that she attributes to a possible Baker's cyst. She has frequent falls and cannot rise independently because she cannot bear weight on the right knee. She ambulates with a walker and does not use a wheelchair at home.  She previously completed outpatient and home health physical therapy and now performs home exercises only, as caregiving duties prevent her from leaving the house. Her original orthopedic surgeon told her she might have a bad implant but received no definitive diagnosis and has not had revision surgery. She recalls a difficult postoperative suture removal that required deep dissection for a retained stitch.  For pain, she takes acetaminophen  three tablets in the morning and three at night plus gabapentin  with minimal relief, and uses tramadol  50 mg for severe pain. She does not use muscle relaxants or benzodiazepines. Pain disrupts her sleep, especially in the morning and at night. She also notes generalized pain, including significant back pain on waking.   Current Outpatient Medications  Medication Sig Dispense Refill    metoprolol  TARTrate (LOPRESSOR ) 25 MG tablet Take 12.5 mg by mouth 3 (three) times daily as needed     MOUNJARO  7.5 mg/0.5 mL pen injector Inject 7.5 mg (0.5 ml) into the skin once a week.     acetaminophen  (TYLENOL ) 500 MG tablet 500 mg every 6 (six) hours as needed     albuterol  (PROVENTIL ) 2.5 mg /3 mL (0.083 %) nebulizer solution Inhale 2.5 mg into the lungs every 6 (six) hours as needed     albuterol  90 mcg/actuation inhaler Inhale 2 inhalations into the lungs as needed.     albuterol  MDI, PROVENTIL , VENTOLIN , PROAIR , HFA 90 mcg/actuation inhaler 2 puff EVERY 6 HOURS (route: inhalation)     anastrozole  (ARIMIDEX ) 1 mg tablet Take 1 mg by mouth once daily     apixaban  (ELIQUIS ) 2.5 mg tablet Take 1 tablet (2.5 mg total) by mouth every 12 (twelve) hours. Pt takes for PE 2015     azelastine  (ASTEPRO ) 0.15 % (205.5 mcg) nasal spray Place 2 sprays into both nostrils once daily     bisoprolol  (ZEBETA ) 10 MG tablet Take 10 mg by mouth 2 (two) times daily     calcium  carbonate-vitamin D3 (CALTRATE 600+D) 600 mg(1,500mg ) -200 unit tablet Take by mouth once daily     cetirizine  (ZYRTEC ) 10 MG tablet Take 10 mg by mouth once daily     cholecalciferol  (VITAMIN D3) 1000 unit tablet Take 1,000 Units by mouth once daily     cyanocobalamin  (VITAMIN B12) 1,000 mcg/mL injection Inject 1 mL (1,000 mcg total) into the muscle as directed Once a week for 4 weeks then once a month for 4 months 8 mL 0  cyclobenzaprine  (FLEXERIL ) 5 MG tablet Take 5 mg by mouth 3 (three) times daily as needed     dulaglutide  (TRULICITY ) 1.5 mg/0.5 mL subcutaneous injection Inject 3 mg subcutaneously once a week     DULoxetine  (CYMBALTA ) 60 MG DR capsule Take 120 mg by mouth once daily     fluticasone  propionate (FLONASE ) 50 mcg/actuation nasal spray Place 2 sprays into both nostrils once daily as needed     fluticasone -umeclidinium-vilanterol (TRELEGY ELLIPTA ) 100-62.5-25 mcg inhaler Inhale 1 inhalation into the  lungs once daily     gabapentin  (NEURONTIN ) 300 MG capsule Take 600 mg by mouth nightly.     insulin  DEGLUDEC (TRESIBA  FLEXTOUCH) pen injector (concentration 100 units/mL) Inject subcutaneously once daily     isosorbide  mononitrate (IMDUR ) 60 MG ER tablet Take 60 mg by mouth nightly Half in am one at night       levothyroxine  (SYNTHROID ) 150 MCG tablet Take 150 mcg by mouth once daily Take on an empty stomach with a glass of water  at least 30-60 minutes before breakfast.     lidocaine  (LIDODERM ) 5 % patch Place 1 patch onto the skin daily     losartan  (COZAAR ) 50 MG tablet Take 25 mg by mouth nightly        metoprolol  succinate (TOPROL -XL) 50 MG XL tablet Take 50 mg by mouth nightly.     mometasone  (ELOCON ) 0.1 % lotion Apply topically once daily     montelukast  (SINGULAIR ) 10 mg tablet Take 10 mg by mouth once daily     MYRBETRIQ  50 mg ER tablet Take 50 mg by mouth once daily     nitrofurantoin , macrocrystal-monohydrate, (MACROBID ) 100 MG capsule Take 100 mg by mouth once daily     nitroGLYcerin  (NITROSTAT ) 0.4 MG SL tablet Place 1 tablet under the tongue as directed.     pantoprazole  (PROTONIX ) 40 MG DR tablet Take 40 mg by mouth nightly.     rosuvastatin  (CRESTOR ) 40 MG tablet Take 40 mg by mouth every morning.       traMADoL  (ULTRAM ) 50 mg tablet Take 1-2 tablets (50-100 mg total) by mouth every 4 (four) hours as needed for Pain 30 tablet 0   trimethoprim  100 mg tablet Take 100 mg by mouth nightly.       TRULICITY  4.5 mg/0.5 mL subcutaneous pen injector Inject 4.5 mg subcutaneously once a week     zinc  50 mg Tab Take 1 tablet by mouth once daily     No current facility-administered medications for this visit.   Allergies  Allergen Reactions   Hydrocodone  Other (See Comments)    unresponsive   Aleve [Naproxen Sodium] Itching and Other (See Comments)    Other reaction(s): HIVES Hives & itching  welts   Iodinated Contrast Media Other (See Comments)    Pt avoids  due to kidney issues Pt avoids due to kidney issues   Ioxaglate Meglumine Other (See Comments)    Pt avoids due to kidney issues   Ioxaglic Acid Other (See Comments)    (iodine ) Pt avoids due to kidney issues   Naproxen Hives   Other Nausea    RENEXA   Oxycodone  Hallucination and Other (See Comments)    hallucinations   Ranolazine Nausea and Other (See Comments)    Sick on stomach Sick on stomach Sick on stomach Sick on stomach Sick on stomach Sick on stomach   Sulfa (Sulfonamide Antibiotics) Other (See Comments)    Patient avoids due to kidney issues.    Past Medical  History:  Diagnosis Date   Acid reflux    Asthma without status asthmaticus (HHS-HCC)    Cancer (CMS/HHS-HCC) 08/2018   14 mm grade 2 invasive mammary carcinoma, margins all widely clear.  T1c, N0.  ER/PR: 100%, Ki-67 20%, HER2/neu negative.   Cervical disc disease    Chest pain    often acid reflux, has never needed to use nitroglycerin , Cardiologist Dr Fernand   CHF (congestive heart failure) (CMS/HHS-HCC)    Clotting disorder (HHS-HCC)    PE   COPD (chronic obstructive pulmonary disease) (CMS/HHS-HCC)    Coronary artery disease    some blockage note on cath per patient   Diabetes mellitus type 2, uncomplicated (CMS/HHS-HCC)    Fasting BGM typically 105-130   GERD (gastroesophageal reflux disease)    History of DVT (deep vein thrombosis)    Hypercholesterolemia    Hypertension    denies CP or SOB, no cardiac stent   Kidney disease    decreased function, no treatment, followed by Dr Monte   Lung cancer (CMS/HHS-HCC) 07/2020   Personal history of radiation therapy    PONV (postoperative nausea and vomiting)    Pulmonary embolism (CMS/HHS-HCC) 12/25/2013   PVD (peripheral vascular disease)    Stroke (CMS/HHS-HCC)    TIA; no deficit   Thyroid  disease    hypothyroidism   Past Surgical History:  Procedure Laterality Date   CHOLECYSTECTOMY  2001   cardiac  catheterization  01/04/2011   COLONOSCOPY  2013   cardiac catheterization  12/07/2011   BACK SURGERY  2014   fusion : 2 rods, 4 screws   LAMINECTOMY POSTERIOR LUMBAR FACETECTOMY & FORAMINOTOMY W/DECOMP Right 06/25/2013   Procedure: Right lumbar 4-5 laminectomy    ;  Surgeon: Rodgers Glendia Buffy, MD;  Location: [facility] OR;  Service: Orthopedics;  Laterality: Right;   APPENDECTOMY  12/2014   COLONOSCOPY  05/24/2015   FUSION LATERAL INTERBODY DLIF/XLIF Left 12/22/2015   Procedure: LEFT LUMBAR 3-4, LUMBAR 4-5 ANTERIOR FUSION LATERAL APPROACH AND POSTERIOR FUSION WITH EXTENSION OF FUSION LUMBAR 3-4, LUMBAR 4-5, LUMBAR 5-SACRAL 1 ALLOGRAFT POSSIBLE LAMINECTOMY;  Surgeon: Rodgers Glendia Buffy, MD;  Location: [facility] OR;  Service: Orthopedics;  Laterality: Left;   ARTHRODESIS ANTERIOR CERVICLE SPINE Left 12/22/2015   Procedure: ARTHRODESIS, ANTERIOR INTERBODY TECHNIQUE, INCLUDING MINIMAL DISCECTOMY TO PREPARE INTERSPACE (OTHER THAN DECOMPRESSION); EACH ADDITIONAL INTERSPACE (LIST IN ADDITION TO CODE FOR PRIMARY PROCEDURE);  Surgeon: Rodgers Glendia Buffy, MD;  Location: [facility] OR;  Service: Orthopedics;  Laterality: Left;   POSTERIOR LUMBAR SPINE FUSION ONE LEVEL LATERAL TRANSVERSE TECHNIQUE Left 12/22/2015   Procedure: ARTHRODESIS, POSTERIOR OR POSTEROLATERAL TECHNIQUE, SINGLE LEVEL; LUMBAR (WITH LATERAL TRANSVERSE TECHNIQUE, WHEN PERFORMED);  Surgeon: Rodgers Glendia Buffy, MD;  Location: [facility] OR;  Service: Orthopedics;  Laterality: Left;   INSTRUMENTATION POSTERIOR SPINE 3 TO 6 VERTEBRAL SEGMENTS Left 12/22/2015   Procedure: POSTERIOR SEGMENTAL INSTRUMENTATION (EG, PEDICLE FIXATION, DUAL RODS WITH MULTIPLE HOOKS AND SUBLAMINAR WIRES); 3 TO 6 VERTEBRAL SEGMENTS (LIST IN ADDITION TO PRIMARY PROCEDURE);  Surgeon: Rodgers Glendia Buffy, MD;  Location: [facility] OR;  Service: Orthopedics;  Laterality: Left;   FUSION LATERAL INTERBODY DLIF/XLIF Left 05/17/2016   Procedure: LEFT LUMBAR 2-3 ANTERIOR FUSION LATERAL APPROACH AND  POSTERIOR SPINAL FUSION POSSIBLE LAMINECTOMY AND ALLOGRAFT;  Surgeon: Rodgers Glendia Buffy, MD;  Location: [facility] OR;  Service: Orthopedics;  Laterality: Left;   MASTECTOMY PARTIAL / LUMPECTOMY Left 08/26/2018   COLONOSCOPY  01/28/2019   Approximately 8 small tubular adenomas less than 5 mm identified.  Based on 2020 guidelines  repeat in 3 years.   EGD  01/28/2019   heart cath and coronary angiography Left 04/04/2019   ARTHROPLASTY TOTAL KNEE Right 11/13/2019   BIOPSY TEMPORAL ARTERY Right 02/28/2021   ANGIOGRAPHY FOR ANGIOPLASTY LOWER EXTREMITY Bilateral    INTRO CATH TO MAIN PULMONARY ARTERY/RIGHT HEART     some blockages in heart:    spinal cord stimulator placement     SPINE SURGERY     THYROIDECTOMY W/LIMITED NECK DISSECTION     trigger finger surgery      Family History  Problem Relation Name Age of Onset   Colon cancer Mother  32   Diabetes Father     High blood pressure (Hypertension) Father     Heart disease Father     Myocardial Infarction (Heart attack) Father     Lung cancer Sister     COPD Sister     Lung cancer Sister     Dementia Sister     Heart disease Sister     Heart disease Sister     Diabetes type II Brother     High blood pressure (Hypertension) Brother     Diabetes Brother     Myocardial Infarction (Heart attack) Brother     Heart disease Brother     Colon cancer Brother  76   Kidney cancer Brother     Diabetes type II Brother     Heart disease Brother     Diabetes type II Brother     Heart disease Brother     Diabetes type II Brother     Heart disease Brother     Diabetes type II Brother     Heart disease Brother     Breast cancer Neg Hx      Social History   Socioeconomic History   Marital status: Married  Tobacco Use   Smoking status: Former    Current packs/day: 0.00    Types: Cigarettes    Start date: 03/13/1992    Quit date: 03/14/2007    Years since quitting: 17.0   Smokeless tobacco: Never    Tobacco comments:    Last smoked 5+ years ago  Substance and Sexual Activity   Alcohol  use: No   Drug use: No   Sexual activity: Not Currently   Social Drivers of Health   Financial Resource Strain: Low Risk  (03/18/2024)   Overall Financial Resource Strain (CARDIA)    Difficulty of Paying Living Expenses: Not hard at all  Food Insecurity: No Food Insecurity (03/18/2024)   Hunger Vital Sign    Worried About Running Out of Food in the Last Year: Never true    Ran Out of Food in the Last Year: Never true  Transportation Needs: No Transportation Needs (03/18/2024)   PRAPARE - Administrator, Civil Service (Medical): No    Lack of Transportation (Non-Medical): No    Review of Systems:  A comprehensive 14 point ROS was performed, reviewed, and the pertinent orthopaedic findings are documented in the HPI.  Physical Exam: Vitals:   03/18/24 1446  BP: 110/64  Weight: 76.7 kg (169 lb)  Height: 167.6 cm (5' 6)  PainSc: 10-Worst pain ever  PainLoc: Knee   Body mass index is 27.28 kg/m. well tolerated hypertension  General/Constitutional: The patient appears to be well-nourished, well-developed, and in no acute distress. Patient in wheelchair.  Neuro/Psych: Normal mood and affect, oriented to person, place and time Respiratory: Normal chest excursion, no wheezes, non-labored breathing Vascular: No edema, swelling  or tenderness, except as noted in detailed exam Integumentary: No impressive skin lesions present, except as noted in detailed exam Musculoskeletal: Unremarkable, except as noted in detailed exam  Right knee exam:     Soft tissue swelling: mild    Effusion:  none    Erythema:  none    Crepitance:  moderate    Tenderness:  medial joint line, hamstrings, and medial retinaculum    Alignment:  normal    Mediolateral laxity: stable    Anterior drawer test:negative    Lachman`s test: equivocal    McMurray`s test: negative    Atrophy:  No significant  atrophy.      Quadriceps tone was fair.     Range of Motion: 0/15/105 degrees  Patient is neurovascularly intact all dermatomes extending down their Right lower extremity and into their foot.  Posterior tibial pulses appreciated, 2+.    Knee Imaging:  I ordered and interpreted standing AP, lateral, and sunrise views of the right knee that were obtained in the office today. Good position of the total knee implants. Good alignment is noted on the AP view. Good cement mantle is appreciated without evidence of loosening. No evidence of polyethylene wear or osteolysis. No evidence of fracture or dislocation.    Assessment:  Encounter Diagnoses  Name Primary?   Right knee pain, unspecified chronicity Yes   Status post right knee replacement    Pain in knee region after total knee replacement, initial encounter     Plan: The treatment options were discussed with the patient. All of the patient's questions and concerns were answered.  Patient call any time with further concerns.   Assessment & Plan Pain in knee region after total knee replacement Chronic right knee pain and dysfunction post-total knee arthroplasty with no radiographic evidence of hardware loosening. Differential includes synovial fluid changes, decreased muscle tone, or referred pain. Revision surgery not indicated at this time; conservative management prioritized. - Ordered home health physical therapy for muscle weakness and joint stability improvement. Discussed the importance of muscle strengthening and activity. - Prescribed tramadol  50 mg for severe pain as needed. - Provided education on typical post-arthroplasty numbness and pain course. - Advised to contact clinic for updates or symptom worsening.  This note has been created using automated tools and reviewed for accuracy by JULIANA VOLPINI CASTANHEIRO DE C COSTA.  Vick Dunk, PA-C [facility]  I spent a total of 50 minutes in both  face-to-face and non-face-to-face activities, excluding procedures performed, for this visit on the date of this encounter.

## 2024-03-19 ENCOUNTER — Ambulatory Visit

## 2024-03-25 ENCOUNTER — Encounter: Payer: Self-pay | Admitting: Family Medicine

## 2024-03-25 ENCOUNTER — Ambulatory Visit: Admitting: Family Medicine

## 2024-03-25 VITALS — BP 112/69 | HR 76 | Temp 97.4°F | Ht 66.0 in

## 2024-03-25 DIAGNOSIS — N3001 Acute cystitis with hematuria: Secondary | ICD-10-CM

## 2024-03-25 DIAGNOSIS — N1832 Chronic kidney disease, stage 3b: Secondary | ICD-10-CM | POA: Diagnosis not present

## 2024-03-25 DIAGNOSIS — E039 Hypothyroidism, unspecified: Secondary | ICD-10-CM

## 2024-03-25 DIAGNOSIS — Z794 Long term (current) use of insulin: Secondary | ICD-10-CM

## 2024-03-25 DIAGNOSIS — F419 Anxiety disorder, unspecified: Secondary | ICD-10-CM | POA: Diagnosis not present

## 2024-03-25 DIAGNOSIS — E1122 Type 2 diabetes mellitus with diabetic chronic kidney disease: Secondary | ICD-10-CM

## 2024-03-25 DIAGNOSIS — R300 Dysuria: Secondary | ICD-10-CM

## 2024-03-25 MED ORDER — BUSPIRONE HCL 5 MG PO TABS: 5.0000 mg | ORAL_TABLET | Freq: Two times a day (BID) | ORAL | 2 refills | Status: AC

## 2024-03-25 MED ORDER — PANTOPRAZOLE SODIUM 40 MG PO TBEC: 80.0000 mg | DELAYED_RELEASE_TABLET | Freq: Every day | ORAL | 1 refills | Status: AC

## 2024-03-25 MED ORDER — BACLOFEN 10 MG PO TABS: 5.0000 mg | ORAL_TABLET | Freq: Every evening | ORAL | 0 refills | Status: AC | PRN

## 2024-03-25 NOTE — Progress Notes (Unsigned)
" ° °  BP 112/69   Pulse 76   Temp (!) 97.4 F (36.3 C) (Oral)   Ht 5' 6 (1.676 m)   SpO2 97%   BMI 28.05 kg/m    Subjective:    Patient ID: Tammy [name], female    DOB: [birth year], 79 y.o.   MRN: [id number]  HPI: Tammy [name] is a 79 y.o. female  Chief Complaint  Patient presents with   Headache   Urinary Frequency   went to see the urologist in   Still has a headache.   Relevant past medical, surgical, family and social history reviewed and updated as indicated. Interim medical history since our last visit reviewed. Allergies and medications reviewed and updated.  Review of Systems  Per HPI unless specifically indicated above     Objective:    BP 112/69   Pulse 76   Temp (!) 97.4 F (36.3 C) (Oral)   Ht 5' 6 (1.676 m)   SpO2 97%   BMI 28.05 kg/m   Wt Readings from Last 3 Encounters:  02/05/24 173 lb 12.8 oz (78.8 kg)  12/25/23 179 lb (81.2 kg)  11/26/23 179 lb 12.8 oz (81.6 kg)    Physical Exam  Results for orders placed or performed in visit on 03/11/24  VAS US  ABI WITH/WO TBI   Collection Time: 03/11/24  2:26 PM  Result Value Ref Range   Right ABI 1.29    Left ABI 1.13    *Note: Due to a large number of results and/or encounters for the requested time period, some results have not been displayed. A complete set of results can be found in Results Review.      Assessment & Plan:   Problem List Items Addressed This Visit   None Visit Diagnoses       Dysuria    -  Primary        Follow up plan: No follow-ups on file.      "

## 2024-03-26 NOTE — Progress Notes (Signed)
 "  Cardiology Clinic Note   Date: 03/28/2024 ID: HESTER JOSLIN, DOB 1946-02-25, MRN [id number]  Primary Cardiologist:  Evalene Lunger, MD  Chief Complaint   HUNTLEIGH DOOLEN is a 79 y.o. female who presents to the clinic today for routine follow up.   Patient Profile   NEVADA KIRCHNER is followed by Dr. Gollan for the history outlined below.      Past medical history significant for: CAD. LHC 01/04/2011 (angina): Proximal LAD 30%.  Mid LAD 40%.  Mid LCx 40%.  Proximal RCA 80%.  Mid RCA 100% with good collaterals from distal LAD.  Recommend medical management. LHC 12/07/2011 (angina): Mid LAD 50%.  Distal LAD 50%.  Proximal RCA 70%.  Mid RCA known CTO. LHC 04/04/2019 (angina): Proximal to mid RCA 100% with left-to-right collaterals.  Mid LAD 45%.  Stable disease when compared to September 2013. R/LHC 09/26/2021 (exertional dyspnea): Mid LAD 45%.  Proximal to mid RCA 100% with well-developed left-to-right collaterals.  Proximal RCA 90%.  No significant change from January 2021.  Recommend continuing aggressive medical therapy.  Right heart catheterization showed mildly elevated filling pressures, mild pulmonary hypertension and normal cardiac output. Echo 03/10/2022: EF 55 to 60%.  No RWMA.  Normal diastolic parameters.  Normal RV size/function.  No significant valvular abnormalities. PSVT. 14-day ZIO 12/29/2022: Heart rate 65 to 160 bpm, average 82 bpm.  561 nonsustained runs of SVT longest 19 beats.  Rare ectopy.  No sustained arrhythmias.  No A-fib. PAD. Aortogram and lower extremity angiogram 10/23/2022: PTA right tibioperoneal trunk and most proximal posterior tibial artery.  PTA right SFA and popliteal arteries.  Stent placement right SFA and most proximal popliteal artery. ABI 03/11/2024: Bilateral ABI/TBI within normal limits.  ABI/TBI's appear increased compared to prior study June 2025. Abdominal aortic atherosclerosis. Carotid artery disease. Carotid duplex 02/06/2024: Bilateral ICA 1 to  39%. Hypertension. Hyperlipidemia. Lipid panel 03/05/2024: LDL 48, HDL 46, TG 176, total 123.SABRA  PE/DVT. COPD. T2DM. Hypothyroidism.   CKD stage IIIb.  In summary, patient has a history of CAD with CTO of RCA dating back to October 2012 as detailed above.  She had repeat catheterization in September 2013 which showed stable coronary disease. Echo at that time showed normal LV function, mild inferior hypokinesis suggestive of CAD, mild LVH, mild diastolic dysfunction, normal PA pressure.  In January 2021 she complained of chest pain worsened with exertion and relieved with NTG.  She underwent LHC which showed stable disease when compared to catheterization in September 2013.  In July 2023 patient complained of most severe episode of chest pain resolved with several NTG.  She underwent heart catheterization which was unchanged from prior catheterization.  Aggressive medical therapy was recommended.  Echo December 2023 showed normal LV/RV function as detailed above.  Patient underwent lower extremity intervention with VVS in August 2024 as detailed above.   Patient was seen in the office on 04/03/2023 for routine follow up. She was doing well at that time with occasional brief episodes of chest discomfort that resolved on its own and typically occurred without exertion. She attributed chest pain to poorly healed rib fractures.  She reported occasional palpitations occurring in the morning and lasting a few minutes before resolving. BP had been trending down. BP at the time of her visit 100/60. Isosorbide  was decreased.    Patient was seen for routine follow-up in July 2025. She reported sharp discomfort starting in the right breast and moving across to her left breast occurring daily and  lasting hours.  She wondered if discomfort was related to lumpectomy performed in 2020.  She also complained of palpitations associated with chest discomfort.  She had stopped Toprol  per PCP instructions.  She monitors  heart rate with a smart watch and gets alerted when heart rate increases above 101 bpm.  She also complained of increasing lightheadedness with position changes and while walking.  She declined carotid ultrasound.  She also declined ischemic evaluation.  She was instructed to stop isosorbide  and restart metoprolol  at 25 mg twice daily.  Patient presented to the ED on 11/03/2023 for fall x 2.  Patient had 1 fall for which EMS evaluated her in her home but she refused transport.  90 minutes later she had a second fall tripping over her dog and sustaining an abrasion above left eye.  She reported to EMS she felt confused and weak all over and was transported to the ED.  She was found to have a UTI.  She was treated and discharged on 11/05/2023.   Patient was last seen in the office by me on 12/25/2023 for routine follow-up.  She reported recent development of bilateral leg spasms occurring randomly.  She had been ambulating with a rollator since hospital discharge in August.  She reported continued daily episodes of right-sided chest pain radiating to the left lasting minutes and resolving on its own occurring several times a day at rest.  She described the same episodes after clinic visit in July and continued to decline ischemic evaluation.  She was also concerned about left breast pain as she had the same discomfort prior to her breast cancer diagnosis.  She reported some positional lightheadedness as well as lightheadedness/unsteadiness with walking.  Carotid ultrasound was performed and demonstrated stable mild carotid stenosis.  She had ABIs ordered by vascular surgery which were normal.     History of Present Illness    Today, patient is accompanied by her daughter. She has multiple complaints today. She reports recent UTI with reaction to antibiotic. She is waiting to hear back from PCP with new antibiotic. She reports constant reflux for which Protonix  was increased by PCP one week ago. She states she  sees no difference in symptoms. She also describes chest pain starting on the right side of her upper chest and radiating to the left described as shooting lasting minutes before resolving on its own. She first states this is a new symptom but reminded patient she has had this pain in the past. She has previously deferred ischemic evaluation. She states she feels it is worse than previous. It occurs with and without exertion and can sometimes worsen when she lays back at night. However, she is not sure if that discomfort is related to her reflux. She also reports dyspnea that is associated with the chest discomfort. He denies recent palpitations. No lower extremity edema, orthopnea or PND.     ROS: All other systems reviewed and are otherwise negative except as noted in History of Present Illness.  EKGs/Labs Reviewed    EKG Interpretation Date/Time:  Friday March 28 2024 15:49:26 EST Ventricular Rate:  74 PR Interval:  136 QRS Duration:  88 QT Interval:  400 QTC Calculation: 444 R Axis:   -46  Text Interpretation: Sinus rhythm with Premature atrial complexes Left axis deviation Moderate voltage criteria for LVH, may be normal variant ( R in aVL , Cornell product ) Septal infarct (cited on or before 03-Apr-2023) When compared with ECG of 03-Nov-2023 04:36, Premature atrial complexes  are now Present Questionable change in initial forces of Septal leads Nonspecific T wave abnormality, improved in Lateral leads Confirmed by Loistine Sober [phone]) on 03/28/2024 3:53:31 PM   03/05/2024: ALT 12; AST 18; BUN 26; Creatinine, Ser 1.25; Potassium 4.6; Sodium 145   03/05/2024: Hemoglobin 13.9; WBC 9.2   03/05/2024: TSH 0.164   Physical Exam    VS:  BP 110/68 (BP Location: Left Arm, Patient Position: Sitting, Cuff Size: Large)   Ht 5' 6 (1.676 m)   Wt 165 lb (74.8 kg)   SpO2 97%   BMI 26.63 kg/m  , BMI Body mass index is 26.63 kg/m.  GEN: Well nourished, well developed, in no acute  distress. Neck: No JVD or carotid bruits. Cardiac:  RRR.  No murmur. No rubs or gallops.   Respiratory:  Respirations regular and unlabored. Clear to auscultation without rales, wheezing or rhonchi. GI: Soft, nontender, nondistended. Extremities: Radials/DP/PT 2+ and equal bilaterally. No clubbing or cyanosis. No edema   Skin: Warm and dry, no rash. Neuro: Strength intact.  Assessment & Plan   CAD LHC x 4 (2012, 2013, 2021, 2023) showing mild to moderate stenosis in LAD and CTO of RCA.  Patient reports continued episodes of chest pain that starts on the right side of her chest and radiates to the left lasting minutes and resolving on its own described as shooting discomfort. These episodes seem to be worse than previous, can occur with and without exertion and are now associated with dyspnea. EKG without acute changes.  - Schedule cardiac PET CT and echo.  - Continue metoprolol , rosuvastatin , as needed SL NTG.  Patient not on aspirin  secondary to Eliquis .   Palpitations/PSVT 14-day ZIO October 2024 ordered by PCP showed 561 runs of PSVT.  Patient does not complain of palpitations today. RRR on exam today. - Continue metoprolol .   PAD S/p PTA and stent placement right lower extremity August 2024.  ABIs December 2025 were within normal limits.  Patient denies current leg discomfort.  -Continue rosuvastatin  and Eliquis . -Followed by VVS.   Hypertension BP today 110/68.   - Continue metoprolol .   Hyperlipidemia LDL 48 December 2025, at goal.  -Continue rosuvastatin .   History of PE/DVT No report of shortness of breath or DOE. Denies spontaneous bleeding concerns. -Continue Eliquis . -Anticoagulation managed by PCP.  Disposition: Schedule cardiac PET CT and echo. Return in 3 months or sooner as needed.      Informed Consent   Shared Decision Making/Informed Consent The risks [chest pain, shortness of breath, cardiac arrhythmias, dizziness, blood pressure fluctuations, myocardial  infarction, stroke/transient ischemic attack, nausea, vomiting, allergic reaction, radiation exposure, metallic taste sensation and life-threatening complications (estimated to be 1 in 10,000)], benefits (risk stratification, diagnosing coronary artery disease, treatment guidance) and alternatives of a cardiac PET stress test were discussed in detail with Ms. Kallam and she agrees to proceed.      Signed, Sober HERO. Izmael Duross, DNP, NP-C  "

## 2024-03-26 NOTE — Progress Notes (Signed)
 This encounter was created in error - please disregard.

## 2024-03-26 NOTE — Addendum Note (Signed)
 Addended by: JANEICE IZETTA HERO on: 03/26/2024 02:21 PM   Modules accepted: Orders, Level of Service

## 2024-03-27 ENCOUNTER — Telehealth: Payer: Self-pay

## 2024-03-27 NOTE — Progress Notes (Signed)
 Complex Care Management Note  Care Guide Note 03/27/2024 Name: Tammy [name] MRN: [id number] DOB: [birth year]  Tammy [name] is a 79 y.o. year old female who sees Vicci Duwaine SQUIBB, DO for primary care. I reached out to Tammy [name] by phone today to offer complex care management services.  [name] was given information about Complex Care Management services today including:   The Complex Care Management services include support from the care team which includes your Nurse Care Manager, Clinical Social Worker, or Pharmacist.  The Complex Care Management team is here to help remove barriers to the health concerns and goals most important to you. Complex Care Management services are voluntary, and the patient may decline or stop services at any time by request to their care team member.   Complex Care Management Consent Status: Patient did not agree to participate in complex care management services at this time.  Follow up plan:    Encounter Outcome:  Patient Refused  Jeoffrey Buffalo , RMA     [facility]  [facility] Guide  Direct Dial: [phone]  Website: [email or web address]

## 2024-03-27 NOTE — Assessment & Plan Note (Addendum)
 Will get VBCI involved to help with stress. Call with any concerns. Too soon to recheck thyroid - will recheck in 3 weeks.

## 2024-03-27 NOTE — Assessment & Plan Note (Signed)
 Will get VBCI involved to help with stress. Call with any concerns.

## 2024-03-28 ENCOUNTER — Ambulatory Visit: Payer: Self-pay

## 2024-03-28 ENCOUNTER — Ambulatory Visit: Attending: Student | Admitting: Student

## 2024-03-28 ENCOUNTER — Encounter: Payer: Self-pay | Admitting: Student

## 2024-03-28 VITALS — BP 110/68 | Ht 66.0 in | Wt 165.0 lb

## 2024-03-28 DIAGNOSIS — I10 Essential (primary) hypertension: Secondary | ICD-10-CM | POA: Insufficient documentation

## 2024-03-28 DIAGNOSIS — Z86711 Personal history of pulmonary embolism: Secondary | ICD-10-CM | POA: Diagnosis present

## 2024-03-28 DIAGNOSIS — I739 Peripheral vascular disease, unspecified: Secondary | ICD-10-CM | POA: Diagnosis present

## 2024-03-28 DIAGNOSIS — Z86718 Personal history of other venous thrombosis and embolism: Secondary | ICD-10-CM | POA: Diagnosis present

## 2024-03-28 DIAGNOSIS — R0602 Shortness of breath: Secondary | ICD-10-CM | POA: Insufficient documentation

## 2024-03-28 DIAGNOSIS — R002 Palpitations: Secondary | ICD-10-CM | POA: Diagnosis not present

## 2024-03-28 DIAGNOSIS — I471 Supraventricular tachycardia, unspecified: Secondary | ICD-10-CM | POA: Diagnosis present

## 2024-03-28 DIAGNOSIS — I2510 Atherosclerotic heart disease of native coronary artery without angina pectoris: Secondary | ICD-10-CM | POA: Diagnosis not present

## 2024-03-28 DIAGNOSIS — E785 Hyperlipidemia, unspecified: Secondary | ICD-10-CM | POA: Insufficient documentation

## 2024-03-28 DIAGNOSIS — R072 Precordial pain: Secondary | ICD-10-CM | POA: Insufficient documentation

## 2024-03-28 NOTE — Patient Instructions (Signed)
 Medication Instructions:   Your physician recommends that you continue on your current medications as directed. Please refer to the Current Medication list given to you today.    *If you need a refill on your cardiac medications before your next appointment, please call your pharmacy*  Lab Work:  None ordered at this time   If you have labs (blood work) drawn today and your tests are completely normal, you will receive your results only by:  MyChart Message (if you have MyChart) OR  A paper copy in the mail If you have any lab test that is abnormal or we need to change your treatment, we will call you to review the results.  Testing/Procedures:  Your physician has requested that you have an echocardiogram. Echocardiography is a painless test that uses sound waves to create images of your heart. It provides your doctor with information about the size and shape of your heart and how well your hearts chambers and valves are working.   You may receive an ultrasound enhancing agent through an IV if needed to better visualize your heart during the echo. This procedure takes approximately one hour.  There are no restrictions for this procedure.  This will take place at [address] [facility]) [address], [place] [address]  Please note: We ask at that you not bring children with you during ultrasound (echo/ vascular) testing. Due to room size and safety concerns, children are not allowed in the ultrasound rooms during exams. Our front office staff cannot provide observation of children in our lobby area while testing is being conducted. An adult accompanying a patient to their appointment will only be allowed in the ultrasound room at the discretion of the ultrasound technician under special circumstances. We apologize for any inconvenience.      Please report to Radiology at the [facility] Main Entrance 30 minutes early for your test.  [address]  [place], [place] [address]                         OR   Please report to Radiology at [facility] Main Entrance, medical mall, 30 mins prior to your test.  [address]  [place], [place]  How to Prepare for Your Cardiac PET/CT Stress Test:  Nothing to eat or drink, except water , 3 hours prior to arrival time.  NO caffeine/decaffeinated products, or chocolate 12 hours prior to arrival. (Please note decaffeinated beverages (teas/coffees) still contain caffeine).  If you have caffeine within 12 hours prior, the test will need to be rescheduled.  Medication instructions: Do not take erectile dysfunction medications for 72 hours prior to test (sildenafil, tadalafil) Do not take nitrates (isosorbide  mononitrate, Ranexa) the day before or day of test Do not take tamsulosin the day before or morning of test Hold theophylline containing medications for 12 hours. Hold Dipyridamole 48 hours prior to the test.  Diabetic Preparation: If able to eat breakfast prior to 3 hour fasting, you may take all medications, including your insulin . Do not worry if you miss your breakfast dose of insulin  - start at your next meal. If you do not eat prior to 3 hour fast-Hold all diabetes (oral and insulin ) medications. Patients who wear a continuous glucose monitor MUST remove the device prior to scanning.  You may take your remaining medications with water .  NO perfume, cologne or lotion on chest or abdomen area. FEMALES - Please avoid wearing  dresses to this appointment.  Total time is 1 to 2 hours; you may want to bring reading material for the waiting time.  IF YOU THINK YOU MAY BE PREGNANT, OR ARE NURSING PLEASE INFORM THE TECHNOLOGIST.  In preparation for your appointment, medication and supplies will be purchased.  Appointment availability is limited, so if you need to cancel or reschedule, please call the Radiology Department Scheduler at [phone] 24 hours in advance to  avoid a cancellation fee of $100.00  What to Expect When you Arrive:  Once you arrive and check in for your appointment, you will be taken to a preparation room within the Radiology Department.  A technologist or Nurse will obtain your medical history, verify that you are correctly prepped for the exam, and explain the procedure.  Afterwards, an IV will be started in your arm and electrodes will be placed on your skin for EKG monitoring during the stress portion of the exam. Then you will be escorted to the PET/CT scanner.  There, staff will get you positioned on the scanner and obtain a blood pressure and EKG.  During the exam, you will continue to be connected to the EKG and blood pressure machines.  A small, safe amount of a radioactive tracer will be injected in your IV to obtain a series of pictures of your heart along with an injection of a stress agent.    After your Exam:  It is recommended that you eat a meal and drink a caffeinated beverage to counter act any effects of the stress agent.  Drink plenty of fluids for the remainder of the day and urinate frequently for the first couple of hours after the exam.  Your doctor will inform you of your test results within 7-10 business days.  For more information and frequently asked questions, please visit our website: [email or web address]  For questions about your test or how to prepare for your test, please call: Cardiac Imaging Nurse Navigators Office: [phone]   Referrals:  None ordered at this time   Follow-Up:  At [facility], you and your health needs are our priority.  As part of our continuing mission to provide you with exceptional heart care, our providers are all part of one team.  This team includes your primary Cardiologist (physician) and Advanced Practice Providers or APPs (Physician Assistants and Nurse Practitioners) who all work together to provide you with the care you need, when you need  it.  Your next appointment:   3 month(s)  Provider:    You may see Timothy Gollan, MD or one of the following Advanced Practice Providers on your designated Care Team:   Lonni Meager, NP Lesley Maffucci, PA-C Bernardino Bring, PA-C Cadence Stanchfield, PA-C Tylene Lunch, NP Barnie Hila, NP    We recommend signing up for the patient portal called MyChart.  Sign up information is provided on this After Visit Summary.  MyChart is used to connect with patients for Virtual Visits (Telemedicine).  Patients are able to view lab/test results, encounter notes, upcoming appointments, etc.  Non-urgent messages can be sent to your provider as well.   To learn more about what you can do with MyChart, go to [email or web address].

## 2024-03-28 NOTE — Telephone Encounter (Signed)
 FYI Only or Action Required?: Action required by provider: clinical question for provider and update on patient condition.  Patient was last seen in primary care on 03/25/2024 by Vicci Duwaine SQUIBB, DO.  Called Nurse Triage reporting Vomiting.  Symptoms began several days ago.  Interventions attempted: OTC medications: cranberry and Prescription medications: augmentin .  Symptoms are: unchanged.  Triage Disposition: Call PCP Within 24 Hours  Patient/caregiver understands and will follow disposition?: Yes    Copied from CRM [id number]. Topic: Clinical - Pink Word Triage >> Mar 28, 2024  4:53 PM Victoria B wrote: [facility] that prompted transfer to Nurse Triage: patient vomiting last night from augementin med  Please warm transfer Red Word call to Nurse Triage and then click Send Message and Close CRM. >> Mar 28, 2024  4:59 PM Victoria B wrote: Reason for Triage: patient vomited, says reaction from taking augmentin    Reason for Disposition  Patient wants to stop the antibiotic  Answer Assessment - Initial Assessment Questions Pt prescribed augmentin  for UTI 01/13 and reports n/v/d until last night. Pt states she has not taken rx today and is feeling better, symptoms have stopped. Pt reports taking zofran  for nausea. Pt does have several allergies and sensitives but also possible that pt is not taking in enough food on abx. Pt states she is drinking but no food tastes good to her. Discussed UTI symptoms and pt denies any pain or burning but orange hue to her urine. Pt is taking otc cranberry and discussed addition of AZO if pt is going to d/c abx. Pt states she has AZO and is not taking abx anymore. Reassured her I would update PCP.      1. ANTIBIOTIC: What antibiotic are you taking? How many times per day?     Augmentin ; pt started 01/13  2. ANTIBIOTIC ONSET: When was the antibiotic started?     01/13  3. DIARRHEA SEVERITY: How bad is the diarrhea? How many more stools have  you had in the past 24 hours than normal?      Pt reports diarrhea when starting abx that has since passed as she has not taken rx today   4. ONSET: When did the diarrhea begin?      01/13  6. VOMITING: Are you also vomiting? If Yes, ask: How many times in the past 24 hours?      Yes; vomiting started yesterday 03/27/24  7. ABDOMEN PAIN: Are you having any abdomen pain? If Yes, ask: What does it feel like? (e.g., crampy, dull, intermittent, constant)      No   8. ABDOMEN PAIN SEVERITY: If present, ask: How bad is the pain?  (e.g., Scale 1-10; mild, moderate, or severe)     No   9. ORAL INTAKE: If vomiting, Have you been able to drink liquids? How much liquids have you had in the past 24 hours?     Pt is eating but states she does not have an appetite and nothing tastes good, everything just tastes the same. Discussed abx needs to be taken with food and could be reason why pt is experiencing n/v/d  10. HYDRATION: Any signs of dehydration? (e.g., dry mouth [not just dry lips], too weak to stand, dizziness, new weight loss) When did you last urinate?       No  Protocols used: Diarrhea on Antibiotics-A-AH

## 2024-03-28 NOTE — Telephone Encounter (Signed)
 Erroneous duplicate encounter.

## 2024-03-31 NOTE — Telephone Encounter (Signed)
 I did not prescribe her the augmentin  and I don't have the urine culture they did to prescribe her the augmentin - she likely needs a different antibiotic and she will need to call her urologist to get another one

## 2024-03-31 NOTE — Telephone Encounter (Signed)
Called and notified patient of Dr. Durenda Age message. Patient verbalized understanding.

## 2024-03-31 NOTE — Telephone Encounter (Signed)
 FYI to provider

## 2024-04-04 ENCOUNTER — Other Ambulatory Visit: Payer: Self-pay | Admitting: Internal Medicine

## 2024-04-11 ENCOUNTER — Ambulatory Visit: Attending: Student

## 2024-04-11 DIAGNOSIS — R0602 Shortness of breath: Secondary | ICD-10-CM | POA: Diagnosis present

## 2024-04-11 LAB — ECHOCARDIOGRAM COMPLETE
AV Mean grad: 1.6 mmHg
AV Peak grad: 2.6 mmHg
Ao pk vel: 0.81 m/s
Area-P 1/2: 3.21 cm2
S' Lateral: 2.8 cm

## 2024-04-13 ENCOUNTER — Ambulatory Visit: Payer: Self-pay | Admitting: Student

## 2024-04-15 ENCOUNTER — Other Ambulatory Visit

## 2024-04-18 ENCOUNTER — Telehealth: Payer: Self-pay | Admitting: Internal Medicine

## 2024-04-18 ENCOUNTER — Ambulatory Visit

## 2024-04-18 NOTE — Telephone Encounter (Signed)
 Called pt to confirm CT for 2/9 - pt husband has surgery next week - unable to make appt - pt will call back to r/s when she is able - [facility]

## 2024-04-18 NOTE — Progress Notes (Signed)
 Letter Sent

## 2024-04-21 ENCOUNTER — Ambulatory Visit: Admission: RE | Admit: 2024-04-21 | Source: Ambulatory Visit

## 2024-04-21 ENCOUNTER — Other Ambulatory Visit

## 2024-04-24 ENCOUNTER — Other Ambulatory Visit

## 2024-04-24 ENCOUNTER — Ambulatory Visit: Payer: Self-pay

## 2024-05-08 ENCOUNTER — Other Ambulatory Visit

## 2024-05-08 ENCOUNTER — Ambulatory Visit: Admitting: Family Medicine

## 2024-06-27 ENCOUNTER — Ambulatory Visit: Admitting: Student

## 2024-08-08 ENCOUNTER — Inpatient Hospital Stay: Admitting: Internal Medicine

## 2024-08-08 ENCOUNTER — Inpatient Hospital Stay
